# Patient Record
Sex: Male | Born: 1945 | Race: White | Hispanic: No | Marital: Married | Smoking: Former smoker
Health system: Southern US, Community
[De-identification: ages and names within clinical notes are randomized; demographics above are authoritative.]

## PROBLEM LIST (undated history)

## (undated) DIAGNOSIS — I700 Atherosclerosis of aorta: Secondary | ICD-10-CM

## (undated) DIAGNOSIS — G4700 Insomnia, unspecified: Secondary | ICD-10-CM

## (undated) DIAGNOSIS — I480 Paroxysmal atrial fibrillation: Secondary | ICD-10-CM

## (undated) DIAGNOSIS — I2510 Atherosclerotic heart disease of native coronary artery without angina pectoris: Secondary | ICD-10-CM

## (undated) DIAGNOSIS — D649 Anemia, unspecified: Secondary | ICD-10-CM

## (undated) DIAGNOSIS — I10 Essential (primary) hypertension: Secondary | ICD-10-CM

## (undated) DIAGNOSIS — I509 Heart failure, unspecified: Secondary | ICD-10-CM

## (undated) DIAGNOSIS — G629 Polyneuropathy, unspecified: Secondary | ICD-10-CM

## (undated) DIAGNOSIS — G8929 Other chronic pain: Secondary | ICD-10-CM

## (undated) DIAGNOSIS — I499 Cardiac arrhythmia, unspecified: Secondary | ICD-10-CM

## (undated) DIAGNOSIS — I639 Cerebral infarction, unspecified: Secondary | ICD-10-CM

## (undated) DIAGNOSIS — Z96649 Presence of unspecified artificial hip joint: Secondary | ICD-10-CM

## (undated) DIAGNOSIS — M1990 Unspecified osteoarthritis, unspecified site: Secondary | ICD-10-CM

## (undated) DIAGNOSIS — K219 Gastro-esophageal reflux disease without esophagitis: Secondary | ICD-10-CM

## (undated) DIAGNOSIS — E785 Hyperlipidemia, unspecified: Secondary | ICD-10-CM

## (undated) DIAGNOSIS — E119 Type 2 diabetes mellitus without complications: Secondary | ICD-10-CM

## (undated) DIAGNOSIS — M5030 Other cervical disc degeneration, unspecified cervical region: Secondary | ICD-10-CM

## (undated) HISTORY — PX: COLONOSCOPY: SHX174

## (undated) HISTORY — PX: OTHER SURGICAL HISTORY: SHX169

## (undated) HISTORY — PX: VSD REPAIR: SHX276

## (undated) HISTORY — PX: BACK SURGERY: SHX140

## (undated) HISTORY — PX: CARDIAC SURGERY: SHX584

## (undated) HISTORY — PX: CATARACT EXTRACTION W/ INTRAOCULAR LENS IMPLANT: SHX1309

## (undated) HISTORY — PX: CARDIAC VALVE REPLACEMENT: SHX585

## (undated) HISTORY — PX: TONSILLECTOMY: SUR1361

## (undated) HISTORY — PX: JOINT REPLACEMENT: SHX530

## (undated) HISTORY — PX: COLONOSCOPY WITH ESOPHAGOGASTRODUODENOSCOPY (EGD): SHX5779

---

## 2000-12-03 ENCOUNTER — Encounter: Payer: Self-pay | Admitting: Neurosurgery

## 2000-12-03 ENCOUNTER — Ambulatory Visit (HOSPITAL_COMMUNITY): Admission: RE | Admit: 2000-12-03 | Discharge: 2000-12-03 | Payer: Self-pay | Admitting: Neurosurgery

## 2000-12-17 ENCOUNTER — Ambulatory Visit (HOSPITAL_COMMUNITY): Admission: RE | Admit: 2000-12-17 | Discharge: 2000-12-18 | Payer: Self-pay | Admitting: Neurosurgery

## 2000-12-17 ENCOUNTER — Encounter: Payer: Self-pay | Admitting: Neurosurgery

## 2003-11-07 ENCOUNTER — Other Ambulatory Visit: Payer: Self-pay

## 2004-08-11 ENCOUNTER — Ambulatory Visit: Payer: Self-pay | Admitting: Unknown Physician Specialty

## 2005-11-11 ENCOUNTER — Ambulatory Visit: Payer: Self-pay | Admitting: Ophthalmology

## 2006-06-24 ENCOUNTER — Inpatient Hospital Stay (HOSPITAL_COMMUNITY): Admission: RE | Admit: 2006-06-24 | Discharge: 2006-06-25 | Payer: Self-pay | Admitting: Orthopaedic Surgery

## 2008-03-07 ENCOUNTER — Other Ambulatory Visit: Payer: Self-pay

## 2008-03-07 ENCOUNTER — Observation Stay: Payer: Self-pay | Admitting: Internal Medicine

## 2008-11-05 ENCOUNTER — Ambulatory Visit: Payer: Self-pay | Admitting: Specialist

## 2008-11-21 ENCOUNTER — Inpatient Hospital Stay: Payer: Self-pay | Admitting: Specialist

## 2009-06-17 ENCOUNTER — Ambulatory Visit: Payer: Self-pay | Admitting: Unknown Physician Specialty

## 2012-10-31 ENCOUNTER — Ambulatory Visit: Payer: Self-pay | Admitting: Internal Medicine

## 2012-12-09 DIAGNOSIS — M1990 Unspecified osteoarthritis, unspecified site: Secondary | ICD-10-CM | POA: Insufficient documentation

## 2013-02-22 ENCOUNTER — Encounter: Payer: Self-pay | Admitting: Internal Medicine

## 2013-03-19 ENCOUNTER — Encounter: Payer: Self-pay | Admitting: Internal Medicine

## 2013-04-18 ENCOUNTER — Encounter: Payer: Self-pay | Admitting: Internal Medicine

## 2013-07-27 ENCOUNTER — Ambulatory Visit: Payer: Self-pay | Admitting: Specialist

## 2013-07-27 LAB — CBC
HCT: 38.4 % — ABNORMAL LOW (ref 40.0–52.0)
HGB: 13.1 g/dL (ref 13.0–18.0)
MCH: 28.9 pg (ref 26.0–34.0)
MCHC: 34.2 g/dL (ref 32.0–36.0)
MCV: 84 fL (ref 80–100)
Platelet: 213 10*3/uL (ref 150–440)
RBC: 4.55 10*6/uL (ref 4.40–5.90)
RDW: 16 % — ABNORMAL HIGH (ref 11.5–14.5)
WBC: 8.6 10*3/uL (ref 3.8–10.6)

## 2013-07-27 LAB — BASIC METABOLIC PANEL
Anion Gap: 7 (ref 7–16)
BUN: 17 mg/dL (ref 7–18)
Calcium, Total: 9 mg/dL (ref 8.5–10.1)
Chloride: 104 mmol/L (ref 98–107)
Co2: 26 mmol/L (ref 21–32)
Creatinine: 0.89 mg/dL (ref 0.60–1.30)
EGFR (African American): >60
EGFR (Non-African Amer.): >60
Glucose: 190 mg/dL — ABNORMAL HIGH (ref 65–99)
Osmolality: 280 (ref 275–301)
Potassium: 3.7 mmol/L (ref 3.5–5.1)
Sodium: 137 mmol/L (ref 136–145)

## 2013-07-27 LAB — APTT: Activated PTT: 28.1 secs (ref 23.6–35.9)

## 2013-07-27 LAB — URINALYSIS, COMPLETE
Bacteria: NONE SEEN
Blood: NEGATIVE
Leukocyte Esterase: NEGATIVE
Nitrite: NEGATIVE
Protein: NEGATIVE
RBC,UR: 1 /HPF (ref 0–5)

## 2013-07-27 LAB — MRSA PCR SCREENING

## 2013-07-27 LAB — PROTIME-INR: Prothrombin Time: 13.4 secs (ref 11.5–14.7)

## 2013-08-09 ENCOUNTER — Inpatient Hospital Stay: Payer: Self-pay | Admitting: Specialist

## 2013-08-10 LAB — CBC WITH DIFFERENTIAL/PLATELET
Basophil #: 0.1 10*3/uL (ref 0.0–0.1)
Basophil %: 0.8 %
Eosinophil #: 0.1 10*3/uL (ref 0.0–0.7)
HCT: 32.3 % — ABNORMAL LOW (ref 40.0–52.0)
HGB: 11.1 g/dL — ABNORMAL LOW (ref 13.0–18.0)
MCHC: 34.4 g/dL (ref 32.0–36.0)
MCV: 85 fL (ref 80–100)
Monocyte #: 1.1 x10 3/mm — ABNORMAL HIGH (ref 0.2–1.0)
Neutrophil #: 7.4 10*3/uL — ABNORMAL HIGH (ref 1.4–6.5)
RBC: 3.8 10*6/uL — ABNORMAL LOW (ref 4.40–5.90)
RDW: 15.9 % — ABNORMAL HIGH (ref 11.5–14.5)

## 2013-08-10 LAB — BASIC METABOLIC PANEL
BUN: 12 mg/dL (ref 7–18)
Co2: 24 mmol/L (ref 21–32)
Creatinine: 0.98 mg/dL (ref 0.60–1.30)
EGFR (Non-African Amer.): >60
Glucose: 143 mg/dL — ABNORMAL HIGH (ref 65–99)
Osmolality: 272 (ref 275–301)
Sodium: 135 mmol/L — ABNORMAL LOW (ref 136–145)

## 2013-08-11 LAB — HEMOGLOBIN: HGB: 11.8 g/dL — ABNORMAL LOW (ref 13.0–18.0)

## 2014-03-28 DIAGNOSIS — F5104 Psychophysiologic insomnia: Secondary | ICD-10-CM | POA: Insufficient documentation

## 2014-03-28 DIAGNOSIS — E119 Type 2 diabetes mellitus without complications: Secondary | ICD-10-CM | POA: Insufficient documentation

## 2014-06-15 ENCOUNTER — Ambulatory Visit: Payer: Self-pay | Admitting: Unknown Physician Specialty

## 2014-08-23 ENCOUNTER — Ambulatory Visit: Payer: Self-pay | Admitting: Internal Medicine

## 2014-12-28 ENCOUNTER — Ambulatory Visit
Admit: 2014-12-28 | Disposition: A | Discharge status: Home / Self Care | Payer: Self-pay | Attending: Internal Medicine | Admitting: Internal Medicine

## 2015-01-18 ENCOUNTER — Ambulatory Visit
Admit: 2015-01-18 | Disposition: A | Discharge status: Home / Self Care | Payer: Self-pay | Attending: Internal Medicine | Admitting: Internal Medicine

## 2015-01-30 DIAGNOSIS — I10 Essential (primary) hypertension: Secondary | ICD-10-CM | POA: Insufficient documentation

## 2015-01-30 DIAGNOSIS — E782 Mixed hyperlipidemia: Secondary | ICD-10-CM | POA: Insufficient documentation

## 2015-02-08 NOTE — Op Note (Signed)
PATIENT NAME:  [name], ACHORD MR#:  [id number] DATE OF BIRTH:  Apr 29, 1946  DATE OF PROCEDURE:  08/09/2013  PREOPERATIVE DIAGNOSIS: Severe osteoarthritis, right hip.   POSTOPERATIVE DIAGNOSIS: Severe osteoarthritis, right hip.  PROCEDURE PERFORMED: DePuy cementless AML right total hip replacement (16.5 wide femoral stem, 58 mm series 300 acetabular cup, 58 mm polyethylene liner +4/0 degree lip, 36 mm metal head and a +1.5 mm neck length).   SURGEON: Park Breed, M.D.   ASSISTANT: Dr. Mack Guise.   ESTIMATED BLOOD LOSS: 300 mL.  REPLACEMENT: None.   DESCRIPTION OF PROCEDURE: The patient was brought to the operating room where he underwent satisfactory spinal anesthesia. He also underwent general endotracheal anesthesia. He was turned in the left lateral decubitus position and padded appropriately with the use of hip grips. The right hip was prepped and draped in sterile fashion and a posterolateral incision made. Dissection was carried out sharply through subcutaneous tissue with great care being used with electrocautery to control bleeding. The fascia was divided and the Charnley retractor inserted. The short external rotators were exposed and released and tagged. The posterior capsule was exposed. A large Steinmann pin was placed into the pelvis above the hip and bent over with the  legs reduced parallel to each other. A stitch was placed in the vastus fascia to identify the original leg length. This pin was then twisted and turned out of the way and the capsule was opened in a T fashion and tagged. The hip was dislocated and the femoral neck was cut with an oscillating saw. The retractors were inserted to expose the acetabulum. He had a large robust labrum, which was excised. The femoral head measured about 55 and 56 mm in size. Reaming of the acetabulum was started at 53 mm and was carried up to 57 mm. A 58 mm trial cup was inserted and it was extremely snug in all directions and covered  well. This was removed and after thorough irrigation, a series 300, 3 spike, 58 mm metallic cup was inserted in 45 degrees of abduction and 25 degrees of anteversion. This seated very well and very snugly. The trial liner with a +4 depth and 10 mm posterosuperior lip was inserted. The femoral canal was then broached. It was opened with a canal finder and reamed to 16 mm. It was then sequentially broached with rasps up to 16.5 wide. A trial reduction was carried out with a +1.5 and +5 mm neck lengths. The leg length was too tight with +5 and the leg lengths with +1.5 showed just slightly lengthening as we wished. Leg lengths appeared to be excellent. The trials were removed.  The polyethylene liner with the 10 mm lip was inserted with the build-up posterosuperiorly and this was done after a hole eliminator was placed in the cuff. The 16.5 mm femoral wide stem was then inserted fully and another trial reduction carried out. This was successful and a 36 mm head with a +1.5 mm and neck length was inserted. The hip was reduced for a final time. The leg lengths were excellent and stability was excellent. The wound was thoroughly irrigated. The posterior capsule was closed with #2 Tycron. The short external rotators were repaired with a similar suture. The fascia was closed with #2 quill over an Autovac drain and the subcutaneous tissue was closed with 0 quill over another Autovac drain. Pulse irrigation was carried out throughout the procedure and at every level of closure. The skin was closed with staples and  TENS pads applied. Dry sterile dressing was applied and the Autovac was activated. The patient was carefully transferred onto his back and then to his hospital bed. The hip was stable. Leg lengths were excellent. The patient was awakened and taken to recovery in good condition.   ____________________________ Park Breed, MD hem:aw D: 08/09/2013 10:58:26 ET T: 08/09/2013 11:21:19  ET JOB#: [id number]  cc: Park Breed, MD, <Dictator> Park Breed MD ELECTRONICALLY SIGNED 08/09/2013 13:17

## 2015-02-08 NOTE — Discharge Summary (Signed)
PATIENT NAME:  [name], SKOW MR#:  [id number] DATE OF BIRTH:  1945/11/04  DATE OF ADMISSION:  08/09/2013 DATE OF DISCHARGE:  08/12/2013   FINAL DIAGNOSIS: Advanced osteoarthritis, right hip.  OPERATION PERFORMED: On 08/09/2013, right AML cementless total hip replacement.   COMPLICATIONS: None.   CONSULTATIONS: None.   DISCHARGE MEDICATIONS: Norco 5/'325mg'$  q6h prn, EC ASA '325mg'$  BID, home medications as prior to admission.   HISTORY OF PRESENT ILLNESS: The patient is a 69 year old male with severe right hip arthritis. His underlying pain interferes with all activities. He has had to use a walker and has fallen recently. NSAIDS have not helped. He has been using narcotics for pain. He is recovered from heart surgery early this year, which was valve replacement at [facility]. He has been cleared hip surgery by his family doctor and cardiologist. The risks and benefits and postoperative protocol were discussed with him and his wife. X-rays show complete joint destruction, cyst formation and sclerosis. He also tested positive for MRSA with a nasal swab and will be treated with vancomycin perioperatively.   PAST MEDICAL HISTORY: Positive MRSA screen, diabetes, neuropathy, heart murmur, hypercholesterolemia, hypertension, mitral valve prolapse with replacement, cataracts.   OPERATIONS: Mitral valve replacement, cataract surgery, carpal tunnel release, neck surgery left total knee replacement.   ALLERGIES: TYLENOL WITH CODEINE, TETRACYCLINE AND DAIRY PRODUCTS.   HOME MEDICATIONS:  Vitamin B12, methadone 5 mg 1/2 tab 3 times a day, Flexeril 10 mg 3 times a day as needed,  loratadine 10 mg daily, omeprazole 20 mg daily. Metoprolol 25 mg 1/2 tablet twice a day, Lasix 40 mg twice a day, Neurontin 600 mg 3 times a day, carbazepine twice a day. Pravastatin 80 mg daily, potassium chloride 20 mg every other day, lisinopril 10 mg daily,.  multivitamins, 81 mg aspirin daily. Tylenol as needed.   FAMILY  PHYSICIAN: Dr. Frazier Richards.   REVIEW OF SYSTEMS: Otherwise unremarkable.   FAMILY HISTORY: Unremarkable.   SOCIAL HISTORY: The patient lives with his wife and is retired.   PHYSICAL EXAMINATION: Healthy male in no distress. He is overweight. He has severe pain with movement of the right hip. He flexes 80 degrees, extends to -20 degrees. Internal rotation lacks 20 degrees of neutral. He externally rotates to 25 degrees. He is 1 cm shorter on the right leg. Neurovascular status was good distally.   LABORATORY DATA: Laboratory data on admission was satisfactory.   HOSPITAL COURSE: On 08/09/2013 the patient underwent cementless AML right total hip replacement. Postoperatively, he did well with very minimal pain. His X-rays looked excellent. He was ambulated and made good progress with PT.  He was placed on oral antibiotics for two days after the IV antibiotics because of his positive MRSA screen. He was discharged 08/12/2013 to home with home physical therapy. He will be partial weight bear. He will be seen in my office in two weeks for exam.   ____________________________ Park Breed, MD hem:sg D: 08/24/2013 10:19:00 ET T: 08/24/2013 10:27:40 ET JOB#: [id number]  cc: Ocie Cornfield. Ouida Sills, MD Park Breed, MD, <Dictator>    Park Breed MD ELECTRONICALLY SIGNED 08/24/2013 11:34

## 2015-02-08 NOTE — H&P (Signed)
Subjective/Chief Complaint Right hip pain   History of Present Illness 69 year old male has severe right hip osteoarthritis with unrelenting pain that interferes with all daily activiy and sleep.  Has to use a walker at all times and NSAIDs not helping.  He has fallen recently due to hip giving way. Using narcotics for pain.  He had recovered from heart surgery earlier this year for valve replacement and has been cleared for hip surgery.  Risks and benefits of surgery were discussed at length including but not limited to infection, non union, nerve or blood vessed damage, non union, need for repeat surgery, blood clots and lung emboli, and death.  He wishes to proceed.  MRSA screen positive so will cover him with Vancomycin. X-rays show complete joint destruction, cysts, and sclerosis.   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  Neuropathy in feet:   murmur:   Hypercholesterolemia:   HTN:   Mitral Valve Prolapse:   Cataract-bilateral lens replacement:   Carpal Tunnel Release:   Neck Surgery:   Knee Surgery - Left:   ALLERGIES:  Tylenol w/Codeine: Headaches  Tetracycline: Hives  All Dairy Products: Hives  HOME MEDICATIONS: Medication Instructions Status  Vitamin B12 1000 mcg oral tablet 1 tab(s) PO once a day  Active  methadone 5 mg oral tablet 0.5 tab(s) orally 3 times a day Active  cyclobenzaprine 10 mg oral tablet 1 tab(s) orally 3 times a day, As Needed- for Pain  Active  loratadine 10 mg oral tablet 1 tab(s) orally once a day Active  omeprazole 20 mg oral delayed release capsule 1 cap(s) orally once a day Active  Metoprolol Tartrate 25 mg oral tablet 1/2 tab orally 2 times a day Active  Lasix 40 mg oral tablet 1 tab(s) orally 2 times a day Active  gabapentin 600 mg oral tablet 1 tab(s) orally 3 times a day Active  OXcarbazepine 1 tab(s) orally 2  times a day Active  pravastatin 80 mg oral tablet 1 tab(s) orally once a day (at bedtime) Active  potassium chloride 20 mEq oral powder for reconstitution 1 tab(s) orally every other day Active  lisinopril 10 mg oral tablet 1 tab(s) orally once a day Active  multi vitamin 1 tab(s) orally once a day Active  Aspir 81 81 mg oral tablet 1 tab(s) orally once a day Active  Tylenol 325 mg oral tablet 1 tab(s) orally 3 times a day Active   Family and Social History:  Family History Non-Contributory   Social History negative tobacco, negative ETOH   Place of Living Home   Review of Systems:  Fever/Chills No   Cough No   Sputum No   Abdominal Pain No   Physical Exam:  GEN well developed, well nourished, no acute distress   HEENT pink conjunctivae   NECK supple   RESP normal resp effort   CARD regular rate   ABD denies tenderness   LYMPH negative neck   EXTR negative edema, Right hip with severe pain on range of motion.  Fleses to 80*, extends to -20* , internal rotation lacks 20* of neutral, ext rotates 25*.  Right 1 cm short.   SKIN normal to palpation   NEURO motor/sensory function intact   PSYCH alert, A+O to time, place, person, good insight    Assessment/Admission Diagnosis Severe osteoarthritis right hip   Plan Right hip total hip replacement  Electronic Signatures: Park Breed (MD)  (Signed 21-Oct-14 18:17)  Authored: CHIEF COMPLAINT and HISTORY, PAST MEDICAL/SURGIAL HISTORY, ALLERGIES, HOME MEDICATIONS, FAMILY AND SOCIAL HISTORY, REVIEW OF SYSTEMS, PHYSICAL EXAM, ASSESSMENT AND PLAN   Last Updated: 21-Oct-14 18:17 by Park Breed (MD)

## 2015-02-12 DIAGNOSIS — I350 Nonrheumatic aortic (valve) stenosis: Secondary | ICD-10-CM | POA: Insufficient documentation

## 2015-02-12 DIAGNOSIS — I2510 Atherosclerotic heart disease of native coronary artery without angina pectoris: Secondary | ICD-10-CM | POA: Insufficient documentation

## 2015-06-13 ENCOUNTER — Other Ambulatory Visit: Payer: Self-pay | Admitting: Internal Medicine

## 2015-06-13 DIAGNOSIS — R42 Dizziness and giddiness: Secondary | ICD-10-CM

## 2015-06-13 DIAGNOSIS — R270 Ataxia, unspecified: Secondary | ICD-10-CM

## 2015-06-18 ENCOUNTER — Ambulatory Visit: Admission: RE | Admit: 2015-06-18 | Payer: Self-pay | Source: Ambulatory Visit

## 2015-06-18 ENCOUNTER — Ambulatory Visit
Admission: RE | Admit: 2015-06-18 | Discharge: 2015-06-18 | Disposition: A | Discharge status: Home / Self Care | Payer: PPO | Source: Ambulatory Visit | Attending: Internal Medicine | Admitting: Internal Medicine

## 2015-06-18 DIAGNOSIS — R270 Ataxia, unspecified: Secondary | ICD-10-CM | POA: Insufficient documentation

## 2015-06-18 DIAGNOSIS — R42 Dizziness and giddiness: Secondary | ICD-10-CM | POA: Diagnosis present

## 2015-11-06 DIAGNOSIS — M47896 Other spondylosis, lumbar region: Secondary | ICD-10-CM | POA: Diagnosis not present

## 2015-11-06 DIAGNOSIS — G894 Chronic pain syndrome: Secondary | ICD-10-CM | POA: Diagnosis not present

## 2015-12-12 DIAGNOSIS — G894 Chronic pain syndrome: Secondary | ICD-10-CM | POA: Diagnosis not present

## 2015-12-12 DIAGNOSIS — Z6839 Body mass index (BMI) 39.0-39.9, adult: Secondary | ICD-10-CM | POA: Diagnosis not present

## 2015-12-12 DIAGNOSIS — Z79899 Other long term (current) drug therapy: Secondary | ICD-10-CM | POA: Diagnosis not present

## 2015-12-12 DIAGNOSIS — I10 Essential (primary) hypertension: Secondary | ICD-10-CM | POA: Diagnosis not present

## 2015-12-12 DIAGNOSIS — Z79891 Long term (current) use of opiate analgesic: Secondary | ICD-10-CM | POA: Diagnosis not present

## 2016-01-09 DIAGNOSIS — I10 Essential (primary) hypertension: Secondary | ICD-10-CM | POA: Diagnosis not present

## 2016-01-09 DIAGNOSIS — G894 Chronic pain syndrome: Secondary | ICD-10-CM | POA: Diagnosis not present

## 2016-01-09 DIAGNOSIS — M5136 Other intervertebral disc degeneration, lumbar region: Secondary | ICD-10-CM | POA: Diagnosis not present

## 2016-01-09 DIAGNOSIS — Z6839 Body mass index (BMI) 39.0-39.9, adult: Secondary | ICD-10-CM | POA: Diagnosis not present

## 2016-02-10 DIAGNOSIS — I10 Essential (primary) hypertension: Secondary | ICD-10-CM | POA: Diagnosis not present

## 2016-02-10 DIAGNOSIS — Z952 Presence of prosthetic heart valve: Secondary | ICD-10-CM | POA: Diagnosis not present

## 2016-02-10 DIAGNOSIS — E1140 Type 2 diabetes mellitus with diabetic neuropathy, unspecified: Secondary | ICD-10-CM | POA: Diagnosis not present

## 2016-02-10 DIAGNOSIS — I2510 Atherosclerotic heart disease of native coronary artery without angina pectoris: Secondary | ICD-10-CM | POA: Diagnosis not present

## 2016-02-10 DIAGNOSIS — R0602 Shortness of breath: Secondary | ICD-10-CM | POA: Diagnosis not present

## 2016-02-10 DIAGNOSIS — I272 Other secondary pulmonary hypertension: Secondary | ICD-10-CM | POA: Diagnosis not present

## 2016-02-10 DIAGNOSIS — I700 Atherosclerosis of aorta: Secondary | ICD-10-CM | POA: Diagnosis not present

## 2016-02-10 DIAGNOSIS — R42 Dizziness and giddiness: Secondary | ICD-10-CM | POA: Diagnosis not present

## 2016-02-10 DIAGNOSIS — K219 Gastro-esophageal reflux disease without esophagitis: Secondary | ICD-10-CM | POA: Diagnosis not present

## 2016-02-10 DIAGNOSIS — I350 Nonrheumatic aortic (valve) stenosis: Secondary | ICD-10-CM | POA: Diagnosis not present

## 2016-02-10 DIAGNOSIS — E782 Mixed hyperlipidemia: Secondary | ICD-10-CM | POA: Diagnosis not present

## 2016-02-10 DIAGNOSIS — R2681 Unsteadiness on feet: Secondary | ICD-10-CM | POA: Diagnosis not present

## 2016-02-11 DIAGNOSIS — M5136 Other intervertebral disc degeneration, lumbar region: Secondary | ICD-10-CM | POA: Diagnosis not present

## 2016-02-11 DIAGNOSIS — G894 Chronic pain syndrome: Secondary | ICD-10-CM | POA: Diagnosis not present

## 2016-03-10 DIAGNOSIS — G894 Chronic pain syndrome: Secondary | ICD-10-CM | POA: Diagnosis not present

## 2016-03-10 DIAGNOSIS — M5136 Other intervertebral disc degeneration, lumbar region: Secondary | ICD-10-CM | POA: Diagnosis not present

## 2016-03-12 DIAGNOSIS — Z125 Encounter for screening for malignant neoplasm of prostate: Secondary | ICD-10-CM | POA: Diagnosis not present

## 2016-03-12 DIAGNOSIS — E1140 Type 2 diabetes mellitus with diabetic neuropathy, unspecified: Secondary | ICD-10-CM | POA: Diagnosis not present

## 2016-03-12 DIAGNOSIS — I10 Essential (primary) hypertension: Secondary | ICD-10-CM | POA: Diagnosis not present

## 2016-03-12 DIAGNOSIS — E782 Mixed hyperlipidemia: Secondary | ICD-10-CM | POA: Diagnosis not present

## 2016-03-12 DIAGNOSIS — Z79899 Other long term (current) drug therapy: Secondary | ICD-10-CM | POA: Diagnosis not present

## 2016-03-19 DIAGNOSIS — G6289 Other specified polyneuropathies: Secondary | ICD-10-CM | POA: Diagnosis not present

## 2016-03-19 DIAGNOSIS — G894 Chronic pain syndrome: Secondary | ICD-10-CM | POA: Diagnosis not present

## 2016-03-19 DIAGNOSIS — F5104 Psychophysiologic insomnia: Secondary | ICD-10-CM | POA: Diagnosis not present

## 2016-03-19 DIAGNOSIS — I739 Peripheral vascular disease, unspecified: Secondary | ICD-10-CM | POA: Diagnosis not present

## 2016-03-19 DIAGNOSIS — I10 Essential (primary) hypertension: Secondary | ICD-10-CM | POA: Diagnosis not present

## 2016-03-19 DIAGNOSIS — Z0000 Encounter for general adult medical examination without abnormal findings: Secondary | ICD-10-CM | POA: Diagnosis not present

## 2016-03-19 DIAGNOSIS — R42 Dizziness and giddiness: Secondary | ICD-10-CM | POA: Diagnosis not present

## 2016-03-19 DIAGNOSIS — I350 Nonrheumatic aortic (valve) stenosis: Secondary | ICD-10-CM | POA: Diagnosis not present

## 2016-03-19 DIAGNOSIS — E782 Mixed hyperlipidemia: Secondary | ICD-10-CM | POA: Diagnosis not present

## 2016-03-19 DIAGNOSIS — E1140 Type 2 diabetes mellitus with diabetic neuropathy, unspecified: Secondary | ICD-10-CM | POA: Diagnosis not present

## 2016-03-26 DIAGNOSIS — M4806 Spinal stenosis, lumbar region: Secondary | ICD-10-CM | POA: Diagnosis not present

## 2016-03-26 DIAGNOSIS — M5126 Other intervertebral disc displacement, lumbar region: Secondary | ICD-10-CM | POA: Diagnosis not present

## 2016-03-26 DIAGNOSIS — Z981 Arthrodesis status: Secondary | ICD-10-CM | POA: Diagnosis not present

## 2016-03-26 DIAGNOSIS — M4807 Spinal stenosis, lumbosacral region: Secondary | ICD-10-CM | POA: Diagnosis not present

## 2016-04-09 DIAGNOSIS — M5136 Other intervertebral disc degeneration, lumbar region: Secondary | ICD-10-CM | POA: Diagnosis not present

## 2016-04-09 DIAGNOSIS — M545 Low back pain: Secondary | ICD-10-CM | POA: Diagnosis not present

## 2016-04-09 DIAGNOSIS — G894 Chronic pain syndrome: Secondary | ICD-10-CM | POA: Diagnosis not present

## 2016-04-09 DIAGNOSIS — G609 Hereditary and idiopathic neuropathy, unspecified: Secondary | ICD-10-CM | POA: Diagnosis not present

## 2016-04-23 DIAGNOSIS — I83222 Varicose veins of left lower extremity with both ulcer of calf and inflammation: Secondary | ICD-10-CM | POA: Diagnosis not present

## 2016-04-23 DIAGNOSIS — I10 Essential (primary) hypertension: Secondary | ICD-10-CM | POA: Diagnosis not present

## 2016-04-23 DIAGNOSIS — I872 Venous insufficiency (chronic) (peripheral): Secondary | ICD-10-CM | POA: Diagnosis not present

## 2016-04-23 DIAGNOSIS — E119 Type 2 diabetes mellitus without complications: Secondary | ICD-10-CM | POA: Diagnosis not present

## 2016-04-23 DIAGNOSIS — I890 Lymphedema, not elsewhere classified: Secondary | ICD-10-CM | POA: Diagnosis not present

## 2016-04-28 DIAGNOSIS — M5136 Other intervertebral disc degeneration, lumbar region: Secondary | ICD-10-CM | POA: Diagnosis not present

## 2016-05-05 DIAGNOSIS — M5136 Other intervertebral disc degeneration, lumbar region: Secondary | ICD-10-CM | POA: Diagnosis not present

## 2016-05-05 DIAGNOSIS — G894 Chronic pain syndrome: Secondary | ICD-10-CM | POA: Diagnosis not present

## 2016-05-21 DIAGNOSIS — E119 Type 2 diabetes mellitus without complications: Secondary | ICD-10-CM | POA: Diagnosis not present

## 2016-05-21 DIAGNOSIS — I872 Venous insufficiency (chronic) (peripheral): Secondary | ICD-10-CM | POA: Diagnosis not present

## 2016-05-21 DIAGNOSIS — I890 Lymphedema, not elsewhere classified: Secondary | ICD-10-CM | POA: Diagnosis not present

## 2016-05-21 DIAGNOSIS — M79609 Pain in unspecified limb: Secondary | ICD-10-CM | POA: Diagnosis not present

## 2016-05-21 DIAGNOSIS — I10 Essential (primary) hypertension: Secondary | ICD-10-CM | POA: Diagnosis not present

## 2016-05-21 DIAGNOSIS — M7989 Other specified soft tissue disorders: Secondary | ICD-10-CM | POA: Diagnosis not present

## 2016-05-21 DIAGNOSIS — I83222 Varicose veins of left lower extremity with both ulcer of calf and inflammation: Secondary | ICD-10-CM | POA: Diagnosis not present

## 2016-06-03 DIAGNOSIS — Z79899 Other long term (current) drug therapy: Secondary | ICD-10-CM | POA: Diagnosis not present

## 2016-06-03 DIAGNOSIS — M5136 Other intervertebral disc degeneration, lumbar region: Secondary | ICD-10-CM | POA: Diagnosis not present

## 2016-06-03 DIAGNOSIS — G894 Chronic pain syndrome: Secondary | ICD-10-CM | POA: Diagnosis not present

## 2016-06-26 DIAGNOSIS — E1140 Type 2 diabetes mellitus with diabetic neuropathy, unspecified: Secondary | ICD-10-CM | POA: Diagnosis not present

## 2016-06-26 DIAGNOSIS — I10 Essential (primary) hypertension: Secondary | ICD-10-CM | POA: Diagnosis not present

## 2016-06-26 DIAGNOSIS — G894 Chronic pain syndrome: Secondary | ICD-10-CM | POA: Diagnosis not present

## 2016-06-26 DIAGNOSIS — Z79899 Other long term (current) drug therapy: Secondary | ICD-10-CM | POA: Diagnosis not present

## 2016-06-26 DIAGNOSIS — G6289 Other specified polyneuropathies: Secondary | ICD-10-CM | POA: Diagnosis not present

## 2016-07-10 DIAGNOSIS — G894 Chronic pain syndrome: Secondary | ICD-10-CM | POA: Diagnosis not present

## 2016-07-10 DIAGNOSIS — M5136 Other intervertebral disc degeneration, lumbar region: Secondary | ICD-10-CM | POA: Diagnosis not present

## 2016-07-20 DIAGNOSIS — M5136 Other intervertebral disc degeneration, lumbar region: Secondary | ICD-10-CM | POA: Diagnosis not present

## 2016-08-12 DIAGNOSIS — M79672 Pain in left foot: Secondary | ICD-10-CM | POA: Diagnosis not present

## 2016-08-12 DIAGNOSIS — M79671 Pain in right foot: Secondary | ICD-10-CM | POA: Diagnosis not present

## 2016-08-12 DIAGNOSIS — R42 Dizziness and giddiness: Secondary | ICD-10-CM | POA: Diagnosis not present

## 2016-08-12 DIAGNOSIS — E782 Mixed hyperlipidemia: Secondary | ICD-10-CM | POA: Diagnosis not present

## 2016-08-12 DIAGNOSIS — I700 Atherosclerosis of aorta: Secondary | ICD-10-CM | POA: Diagnosis not present

## 2016-08-12 DIAGNOSIS — I272 Pulmonary hypertension, unspecified: Secondary | ICD-10-CM | POA: Diagnosis not present

## 2016-08-12 DIAGNOSIS — I2510 Atherosclerotic heart disease of native coronary artery without angina pectoris: Secondary | ICD-10-CM | POA: Diagnosis not present

## 2016-08-12 DIAGNOSIS — E1140 Type 2 diabetes mellitus with diabetic neuropathy, unspecified: Secondary | ICD-10-CM | POA: Diagnosis not present

## 2016-08-12 DIAGNOSIS — I10 Essential (primary) hypertension: Secondary | ICD-10-CM | POA: Diagnosis not present

## 2016-08-25 DIAGNOSIS — M47896 Other spondylosis, lumbar region: Secondary | ICD-10-CM | POA: Diagnosis not present

## 2016-08-25 DIAGNOSIS — G894 Chronic pain syndrome: Secondary | ICD-10-CM | POA: Diagnosis not present

## 2016-08-25 DIAGNOSIS — M545 Low back pain: Secondary | ICD-10-CM | POA: Diagnosis not present

## 2016-10-02 ENCOUNTER — Inpatient Hospital Stay: Payer: PPO

## 2016-10-02 ENCOUNTER — Encounter: Payer: Self-pay | Admitting: Emergency Medicine

## 2016-10-02 ENCOUNTER — Emergency Department: Payer: PPO

## 2016-10-02 ENCOUNTER — Inpatient Hospital Stay
Admission: EM | Admit: 2016-10-02 | Discharge: 2016-10-10 | DRG: 871 | Disposition: A | Discharge status: Skilled Nursing Facility | Payer: PPO | Attending: Internal Medicine | Admitting: Internal Medicine

## 2016-10-02 DIAGNOSIS — K219 Gastro-esophageal reflux disease without esophagitis: Secondary | ICD-10-CM | POA: Diagnosis not present

## 2016-10-02 DIAGNOSIS — R4182 Altered mental status, unspecified: Secondary | ICD-10-CM

## 2016-10-02 DIAGNOSIS — J9601 Acute respiratory failure with hypoxia: Secondary | ICD-10-CM | POA: Diagnosis present

## 2016-10-02 DIAGNOSIS — W08XXXA Fall from other furniture, initial encounter: Secondary | ICD-10-CM | POA: Diagnosis present

## 2016-10-02 DIAGNOSIS — G894 Chronic pain syndrome: Secondary | ICD-10-CM | POA: Diagnosis not present

## 2016-10-02 DIAGNOSIS — E785 Hyperlipidemia, unspecified: Secondary | ICD-10-CM | POA: Diagnosis not present

## 2016-10-02 DIAGNOSIS — R4189 Other symptoms and signs involving cognitive functions and awareness: Secondary | ICD-10-CM | POA: Diagnosis not present

## 2016-10-02 DIAGNOSIS — R0603 Acute respiratory distress: Secondary | ICD-10-CM | POA: Diagnosis not present

## 2016-10-02 DIAGNOSIS — Z825 Family history of asthma and other chronic lower respiratory diseases: Secondary | ICD-10-CM

## 2016-10-02 DIAGNOSIS — I011 Acute rheumatic endocarditis: Secondary | ICD-10-CM | POA: Diagnosis present

## 2016-10-02 DIAGNOSIS — Y92009 Unspecified place in unspecified non-institutional (private) residence as the place of occurrence of the external cause: Secondary | ICD-10-CM

## 2016-10-02 DIAGNOSIS — Z7982 Long term (current) use of aspirin: Secondary | ICD-10-CM | POA: Diagnosis not present

## 2016-10-02 DIAGNOSIS — R509 Fever, unspecified: Secondary | ICD-10-CM | POA: Diagnosis not present

## 2016-10-02 DIAGNOSIS — E1140 Type 2 diabetes mellitus with diabetic neuropathy, unspecified: Secondary | ICD-10-CM | POA: Diagnosis present

## 2016-10-02 DIAGNOSIS — R262 Difficulty in walking, not elsewhere classified: Secondary | ICD-10-CM

## 2016-10-02 DIAGNOSIS — Z79891 Long term (current) use of opiate analgesic: Secondary | ICD-10-CM | POA: Diagnosis not present

## 2016-10-02 DIAGNOSIS — Z8249 Family history of ischemic heart disease and other diseases of the circulatory system: Secondary | ICD-10-CM

## 2016-10-02 DIAGNOSIS — J9600 Acute respiratory failure, unspecified whether with hypoxia or hypercapnia: Secondary | ICD-10-CM | POA: Diagnosis not present

## 2016-10-02 DIAGNOSIS — J189 Pneumonia, unspecified organism: Secondary | ICD-10-CM | POA: Diagnosis not present

## 2016-10-02 DIAGNOSIS — S80811A Abrasion, right lower leg, initial encounter: Secondary | ICD-10-CM | POA: Diagnosis present

## 2016-10-02 DIAGNOSIS — G9340 Encephalopathy, unspecified: Secondary | ICD-10-CM | POA: Diagnosis not present

## 2016-10-02 DIAGNOSIS — E872 Acidosis, unspecified: Secondary | ICD-10-CM

## 2016-10-02 DIAGNOSIS — R338 Other retention of urine: Secondary | ICD-10-CM | POA: Diagnosis not present

## 2016-10-02 DIAGNOSIS — R6520 Severe sepsis without septic shock: Secondary | ICD-10-CM | POA: Diagnosis not present

## 2016-10-02 DIAGNOSIS — A419 Sepsis, unspecified organism: Secondary | ICD-10-CM | POA: Diagnosis present

## 2016-10-02 DIAGNOSIS — G9341 Metabolic encephalopathy: Secondary | ICD-10-CM | POA: Diagnosis not present

## 2016-10-02 DIAGNOSIS — H109 Unspecified conjunctivitis: Secondary | ICD-10-CM | POA: Diagnosis not present

## 2016-10-02 DIAGNOSIS — I10 Essential (primary) hypertension: Secondary | ICD-10-CM | POA: Diagnosis not present

## 2016-10-02 DIAGNOSIS — Z7984 Long term (current) use of oral hypoglycemic drugs: Secondary | ICD-10-CM

## 2016-10-02 DIAGNOSIS — I2510 Atherosclerotic heart disease of native coronary artery without angina pectoris: Secondary | ICD-10-CM | POA: Diagnosis present

## 2016-10-02 DIAGNOSIS — S0990XA Unspecified injury of head, initial encounter: Secondary | ICD-10-CM | POA: Diagnosis not present

## 2016-10-02 DIAGNOSIS — E876 Hypokalemia: Secondary | ICD-10-CM | POA: Diagnosis present

## 2016-10-02 DIAGNOSIS — Z789 Other specified health status: Secondary | ICD-10-CM

## 2016-10-02 DIAGNOSIS — Z4682 Encounter for fitting and adjustment of non-vascular catheter: Secondary | ICD-10-CM | POA: Diagnosis not present

## 2016-10-02 DIAGNOSIS — I509 Heart failure, unspecified: Secondary | ICD-10-CM | POA: Diagnosis not present

## 2016-10-02 DIAGNOSIS — J9811 Atelectasis: Secondary | ICD-10-CM | POA: Diagnosis not present

## 2016-10-02 DIAGNOSIS — Z952 Presence of prosthetic heart valve: Secondary | ICD-10-CM

## 2016-10-02 DIAGNOSIS — Z79899 Other long term (current) drug therapy: Secondary | ICD-10-CM | POA: Diagnosis not present

## 2016-10-02 DIAGNOSIS — W19XXXA Unspecified fall, initial encounter: Secondary | ICD-10-CM

## 2016-10-02 DIAGNOSIS — I4891 Unspecified atrial fibrillation: Secondary | ICD-10-CM | POA: Diagnosis present

## 2016-10-02 DIAGNOSIS — S80812A Abrasion, left lower leg, initial encounter: Secondary | ICD-10-CM | POA: Diagnosis present

## 2016-10-02 DIAGNOSIS — M1990 Unspecified osteoarthritis, unspecified site: Secondary | ICD-10-CM | POA: Diagnosis present

## 2016-10-02 DIAGNOSIS — Z6839 Body mass index (BMI) 39.0-39.9, adult: Secondary | ICD-10-CM

## 2016-10-02 DIAGNOSIS — A4101 Sepsis due to Methicillin susceptible Staphylococcus aureus: Principal | ICD-10-CM | POA: Diagnosis present

## 2016-10-02 DIAGNOSIS — M6281 Muscle weakness (generalized): Secondary | ICD-10-CM

## 2016-10-02 DIAGNOSIS — R0600 Dyspnea, unspecified: Secondary | ICD-10-CM

## 2016-10-02 DIAGNOSIS — A4901 Methicillin susceptible Staphylococcus aureus infection, unspecified site: Secondary | ICD-10-CM | POA: Diagnosis not present

## 2016-10-02 DIAGNOSIS — L8990 Pressure ulcer of unspecified site, unspecified stage: Secondary | ICD-10-CM | POA: Insufficient documentation

## 2016-10-02 HISTORY — DX: Unspecified osteoarthritis, unspecified site: M19.90

## 2016-10-02 HISTORY — DX: Type 2 diabetes mellitus without complications: E11.9

## 2016-10-02 HISTORY — DX: Gastro-esophageal reflux disease without esophagitis: K21.9

## 2016-10-02 HISTORY — DX: Essential (primary) hypertension: I10

## 2016-10-02 HISTORY — DX: Other chronic pain: G89.29

## 2016-10-02 HISTORY — DX: Atherosclerotic heart disease of native coronary artery without angina pectoris: I25.10

## 2016-10-02 HISTORY — DX: Hyperlipidemia, unspecified: E78.5

## 2016-10-02 HISTORY — DX: Polyneuropathy, unspecified: G62.9

## 2016-10-02 HISTORY — DX: Insomnia, unspecified: G47.00

## 2016-10-02 LAB — BLOOD GAS, VENOUS
Acid-base deficit: 0.4 mmol/L (ref 0.0–2.0)
Bicarbonate: 21.8 mmol/L (ref 20.0–28.0)
FIO2: 21
O2 SAT: 67 %
PATIENT TEMPERATURE: 37
PCO2 VEN: 28 mmHg — AB (ref 44.0–60.0)
PO2 VEN: 31 mmHg — AB (ref 32.0–45.0)
pH, Ven: 7.5 — ABNORMAL HIGH (ref 7.250–7.430)

## 2016-10-02 LAB — URINALYSIS, COMPLETE (UACMP) WITH MICROSCOPIC
BILIRUBIN URINE: NEGATIVE
Bacteria, UA: NONE SEEN
Glucose, UA: NEGATIVE mg/dL
HGB URINE DIPSTICK: NEGATIVE
Ketones, ur: 20 mg/dL — AB
LEUKOCYTES UA: NEGATIVE
NITRITE: NEGATIVE
PH: 5 (ref 5.0–8.0)
Protein, ur: NEGATIVE mg/dL
SPECIFIC GRAVITY, URINE: 1.019 (ref 1.005–1.030)
SQUAMOUS EPITHELIAL / LPF: NONE SEEN

## 2016-10-02 LAB — CBC WITH DIFFERENTIAL/PLATELET
BASOS ABS: 0 10*3/uL (ref 0–0.1)
Basophils Absolute: 0 10*3/uL (ref 0–0.1)
Basophils Relative: 0 %
Basophils Relative: 0 %
EOS ABS: 0 10*3/uL (ref 0–0.7)
EOS ABS: 0 10*3/uL (ref 0–0.7)
EOS PCT: 0 %
Eosinophils Relative: 0 %
HCT: 36.8 % — ABNORMAL LOW (ref 40.0–52.0)
HEMATOCRIT: 40.9 % (ref 40.0–52.0)
HEMOGLOBIN: 13.5 g/dL (ref 13.0–18.0)
Hemoglobin: 12.5 g/dL — ABNORMAL LOW (ref 13.0–18.0)
LYMPHS ABS: 0.4 10*3/uL — AB (ref 1.0–3.6)
LYMPHS PCT: 3 %
Lymphocytes Relative: 2 %
Lymphs Abs: 0.3 10*3/uL — ABNORMAL LOW (ref 1.0–3.6)
MCH: 29.1 pg (ref 26.0–34.0)
MCH: 28.9 pg (ref 26.0–34.0)
MCHC: 34.1 g/dL (ref 32.0–36.0)
MCHC: 33.1 g/dL (ref 32.0–36.0)
MCV: 85.4 fL (ref 80.0–100.0)
MCV: 87.2 fL (ref 80.0–100.0)
MONO ABS: 0.6 10*3/uL (ref 0.2–1.0)
Monocytes Absolute: 0.6 10*3/uL (ref 0.2–1.0)
Monocytes Relative: 4 %
Monocytes Relative: 5 %
NEUTROS ABS: 11.1 10*3/uL — AB (ref 1.4–6.5)
NEUTROS PCT: 92 %
Neutro Abs: 13.3 10*3/uL — ABNORMAL HIGH (ref 1.4–6.5)
Neutrophils Relative %: 94 %
PLATELETS: 145 10*3/uL — AB (ref 150–440)
Platelets: 117 10*3/uL — ABNORMAL LOW (ref 150–440)
RBC: 4.31 MIL/uL — AB (ref 4.40–5.90)
RBC: 4.68 MIL/uL (ref 4.40–5.90)
RDW: 15.7 % — AB (ref 11.5–14.5)
RDW: 16 % — ABNORMAL HIGH (ref 11.5–14.5)
WBC: 14.2 10*3/uL — AB (ref 3.8–10.6)
WBC: 12.2 10*3/uL — AB (ref 3.8–10.6)

## 2016-10-02 LAB — BLOOD GAS, ARTERIAL
ACID-BASE DEFICIT: 6.5 mmol/L — AB (ref 0.0–2.0)
ACID-BASE DEFICIT: 3.6 mmol/L — AB (ref 0.0–2.0)
Allens test (pass/fail): POSITIVE — AB
BICARBONATE: 21.4 mmol/L (ref 20.0–28.0)
BICARBONATE: 23.5 mmol/L (ref 20.0–28.0)
FIO2: 100
FIO2: 50
O2 SAT: 96.5 %
O2 Saturation: 86.3 %
PATIENT TEMPERATURE: 37
PATIENT TEMPERATURE: 37
PCO2 ART: 51 mmHg — AB (ref 32.0–48.0)
PCO2 ART: 50 mmHg — AB (ref 32.0–48.0)
PEEP/CPAP: 10 cmH2O
PEEP: 5 cmH2O
PO2 ART: 96 mmHg (ref 83.0–108.0)
RATE: 24 resp/min
VT: 500 mL
VT: 500 mL
pH, Arterial: 7.23 — ABNORMAL LOW (ref 7.350–7.450)
pH, Arterial: 7.28 — ABNORMAL LOW (ref 7.350–7.450)
pO2, Arterial: 62 mmHg — ABNORMAL LOW (ref 83.0–108.0)

## 2016-10-02 LAB — GLUCOSE, CSF: GLUCOSE CSF: 133 mg/dL — AB (ref 40–70)

## 2016-10-02 LAB — COMPREHENSIVE METABOLIC PANEL
ALK PHOS: 76 U/L (ref 38–126)
ALT: 44 U/L (ref 17–63)
ALT: 59 U/L (ref 17–63)
ANION GAP: 11 (ref 5–15)
AST: 97 U/L — AB (ref 15–41)
AST: 122 U/L — ABNORMAL HIGH (ref 15–41)
Albumin: 3.5 g/dL (ref 3.5–5.0)
Albumin: 3.2 g/dL — ABNORMAL LOW (ref 3.5–5.0)
Alkaline Phosphatase: 66 U/L (ref 38–126)
Anion gap: 12 (ref 5–15)
BILIRUBIN TOTAL: 0.7 mg/dL (ref 0.3–1.2)
BUN: 14 mg/dL (ref 6–20)
BUN: 13 mg/dL (ref 6–20)
CALCIUM: 8.4 mg/dL — AB (ref 8.9–10.3)
CHLORIDE: 100 mmol/L — AB (ref 101–111)
CO2: 22 mmol/L (ref 22–32)
CO2: 20 mmol/L — AB (ref 22–32)
CREATININE: 0.85 mg/dL (ref 0.61–1.24)
Calcium: 7.5 mg/dL — ABNORMAL LOW (ref 8.9–10.3)
Chloride: 99 mmol/L — ABNORMAL LOW (ref 101–111)
Creatinine, Ser: 0.78 mg/dL (ref 0.61–1.24)
Glucose, Bld: 195 mg/dL — ABNORMAL HIGH (ref 65–99)
Glucose, Bld: 221 mg/dL — ABNORMAL HIGH (ref 65–99)
POTASSIUM: 3.2 mmol/L — AB (ref 3.5–5.1)
Potassium: 3.1 mmol/L — ABNORMAL LOW (ref 3.5–5.1)
SODIUM: 131 mmol/L — AB (ref 135–145)
Sodium: 133 mmol/L — ABNORMAL LOW (ref 135–145)
TOTAL PROTEIN: 7 g/dL (ref 6.5–8.1)
Total Bilirubin: 0.7 mg/dL (ref 0.3–1.2)
Total Protein: 6.9 g/dL (ref 6.5–8.1)

## 2016-10-02 LAB — CSF CELL COUNT WITH DIFFERENTIAL
Eosinophils, CSF: 0 %
Lymphs, CSF: 40 %
Monocyte-Macrophage-Spinal Fluid: 52 %
Other Cells, CSF: 0
RBC COUNT CSF: 42 /mm3 — AB (ref 0–3)
SEGMENTED NEUTROPHILS-CSF: 8 %
Tube #: 1
WBC CSF: 14 /mm3 — AB (ref 0–5)

## 2016-10-02 LAB — BLOOD CULTURE ID PANEL (REFLEXED)
Acinetobacter baumannii: NOT DETECTED
CANDIDA GLABRATA: NOT DETECTED
CANDIDA KRUSEI: NOT DETECTED
Candida albicans: NOT DETECTED
Candida parapsilosis: NOT DETECTED
Candida tropicalis: NOT DETECTED
ENTEROBACTER CLOACAE COMPLEX: NOT DETECTED
ENTEROCOCCUS SPECIES: NOT DETECTED
ESCHERICHIA COLI: NOT DETECTED
Enterobacteriaceae species: NOT DETECTED
Haemophilus influenzae: NOT DETECTED
KLEBSIELLA PNEUMONIAE: NOT DETECTED
Klebsiella oxytoca: NOT DETECTED
LISTERIA MONOCYTOGENES: NOT DETECTED
Methicillin resistance: NOT DETECTED
Neisseria meningitidis: NOT DETECTED
PROTEUS SPECIES: NOT DETECTED
Pseudomonas aeruginosa: NOT DETECTED
STAPHYLOCOCCUS SPECIES: DETECTED — AB
STREPTOCOCCUS AGALACTIAE: NOT DETECTED
STREPTOCOCCUS PYOGENES: NOT DETECTED
Serratia marcescens: NOT DETECTED
Staphylococcus aureus (BCID): DETECTED — AB
Streptococcus pneumoniae: NOT DETECTED
Streptococcus species: NOT DETECTED

## 2016-10-02 LAB — INFLUENZA PANEL BY PCR (TYPE A & B)
INFLAPCR: NEGATIVE
INFLBPCR: NEGATIVE

## 2016-10-02 LAB — PROTIME-INR
INR: 1.22
PROTHROMBIN TIME: 15.5 s — AB (ref 11.4–15.2)

## 2016-10-02 LAB — EXPECTORATED SPUTUM ASSESSMENT W GRAM STAIN, RFLX TO RESP C

## 2016-10-02 LAB — LACTIC ACID, PLASMA
LACTIC ACID, VENOUS: 2.5 mmol/L — AB (ref 0.5–1.9)
Lactic Acid, Venous: 4.5 mmol/L (ref 0.5–1.9)

## 2016-10-02 LAB — GLUCOSE, CAPILLARY
GLUCOSE-CAPILLARY: 212 mg/dL — AB (ref 65–99)
Glucose-Capillary: 172 mg/dL — ABNORMAL HIGH (ref 65–99)
Glucose-Capillary: 191 mg/dL — ABNORMAL HIGH (ref 65–99)
Glucose-Capillary: 213 mg/dL — ABNORMAL HIGH (ref 65–99)

## 2016-10-02 LAB — PROTEIN, CSF: TOTAL PROTEIN, CSF: 78 mg/dL — AB (ref 15–45)

## 2016-10-02 LAB — TROPONIN I: TROPONIN I: 0.06 ng/mL — AB (ref <0.03)

## 2016-10-02 LAB — EXPECTORATED SPUTUM ASSESSMENT W REFEX TO RESP CULTURE

## 2016-10-02 LAB — PROCALCITONIN: PROCALCITONIN: 4.82 ng/mL

## 2016-10-02 LAB — MAGNESIUM: MAGNESIUM: 1.7 mg/dL (ref 1.7–2.4)

## 2016-10-02 LAB — MRSA PCR SCREENING: MRSA by PCR: NEGATIVE

## 2016-10-02 MED ORDER — HALOPERIDOL LACTATE 5 MG/ML IJ SOLN
2.0000 mg | Freq: Once | INTRAMUSCULAR | Status: AC
  Administered 2016-10-02: 2 mg via INTRAVENOUS
  Filled 2016-10-02: qty 1

## 2016-10-02 MED ORDER — FENTANYL 2500MCG IN NS 250ML (10MCG/ML) PREMIX INFUSION
25.0000 ug/h | INTRAVENOUS | Status: DC
  Administered 2016-10-02: 25 ug/h via INTRAVENOUS
  Administered 2016-10-03: 250 ug/h via INTRAVENOUS
  Administered 2016-10-03: 125 ug/h via INTRAVENOUS
  Administered 2016-10-04: 250 ug/h via INTRAVENOUS
  Filled 2016-10-02 (×4): qty 250

## 2016-10-02 MED ORDER — VECURONIUM BROMIDE 10 MG IV SOLR
10.0000 mg | Freq: Once | INTRAVENOUS | Status: AC
  Administered 2016-10-02: 10 mg via INTRAVENOUS
  Filled 2016-10-02 (×2): qty 10

## 2016-10-02 MED ORDER — ACETAMINOPHEN 650 MG RE SUPP
650.0000 mg | Freq: Once | RECTAL | Status: AC
  Administered 2016-10-02: 650 mg via RECTAL

## 2016-10-02 MED ORDER — FENTANYL CITRATE (PF) 100 MCG/2ML IJ SOLN
100.0000 ug | Freq: Once | INTRAMUSCULAR | Status: AC
  Administered 2016-10-02: 100 ug via INTRAVENOUS
  Filled 2016-10-02: qty 2

## 2016-10-02 MED ORDER — SENNOSIDES-DOCUSATE SODIUM 8.6-50 MG PO TABS
1.0000 | ORAL_TABLET | Freq: Two times a day (BID) | ORAL | Status: DC
  Administered 2016-10-02 – 2016-10-09 (×15): 1 via ORAL
  Filled 2016-10-02 (×16): qty 1

## 2016-10-02 MED ORDER — LORAZEPAM 2 MG/ML IJ SOLN
1.0000 mg | Freq: Once | INTRAMUSCULAR | Status: AC
  Administered 2016-10-02: 1 mg via INTRAVENOUS

## 2016-10-02 MED ORDER — ACETAMINOPHEN 650 MG RE SUPP
650.0000 mg | Freq: Four times a day (QID) | RECTAL | Status: DC | PRN
  Administered 2016-10-03: 650 mg via RECTAL
  Filled 2016-10-02 (×2): qty 1

## 2016-10-02 MED ORDER — LORAZEPAM 2 MG/ML IJ SOLN
INTRAMUSCULAR | Status: AC
  Administered 2016-10-02: 1 mg via INTRAVENOUS
  Filled 2016-10-02: qty 1

## 2016-10-02 MED ORDER — PROPOFOL 1000 MG/100ML IV EMUL
5.0000 ug/kg/min | Freq: Once | INTRAVENOUS | Status: AC
  Administered 2016-10-02: 5 ug/kg/min via INTRAVENOUS

## 2016-10-02 MED ORDER — ONDANSETRON HCL 4 MG/2ML IJ SOLN: 4.0000 mg | Freq: Four times a day (QID) | INTRAMUSCULAR | Status: DC | PRN

## 2016-10-02 MED ORDER — CEFTRIAXONE SODIUM-DEXTROSE 2-2.22 GM-% IV SOLR
2.0000 g | Freq: Two times a day (BID) | INTRAVENOUS | Status: DC
  Administered 2016-10-02: 2 g via INTRAVENOUS
  Filled 2016-10-02 (×3): qty 50

## 2016-10-02 MED ORDER — FENTANYL CITRATE (PF) 100 MCG/2ML IJ SOLN
50.0000 ug | INTRAMUSCULAR | Status: DC | PRN
  Administered 2016-10-03: 50 ug via INTRAVENOUS
  Filled 2016-10-02: qty 2

## 2016-10-02 MED ORDER — VECURONIUM BROMIDE 10 MG IV SOLR
INTRAVENOUS | Status: AC
  Filled 2016-10-02: qty 10

## 2016-10-02 MED ORDER — SODIUM CHLORIDE 0.9 % IV SOLN
Freq: Once | INTRAVENOUS | Status: AC
  Administered 2016-10-02: 09:00:00 via INTRAVENOUS

## 2016-10-02 MED ORDER — SODIUM CHLORIDE 0.9 % IV SOLN
2.0000 g | INTRAVENOUS | Status: DC
  Administered 2016-10-02 (×2): 2 g via INTRAVENOUS
  Filled 2016-10-02 (×6): qty 2000

## 2016-10-02 MED ORDER — ACETAMINOPHEN 650 MG RE SUPP
RECTAL | Status: AC
  Filled 2016-10-02: qty 1

## 2016-10-02 MED ORDER — DEXTROSE 5 % IV SOLN
950.0000 mg | Freq: Three times a day (TID) | INTRAVENOUS | Status: DC
  Administered 2016-10-03 – 2016-10-05 (×8): 950 mg via INTRAVENOUS
  Filled 2016-10-02 (×13): qty 19

## 2016-10-02 MED ORDER — LORAZEPAM 2 MG/ML IJ SOLN
1.0000 mg | Freq: Once | INTRAMUSCULAR | Status: AC
Start: 2016-10-02 — End: 2016-10-02
  Administered 2016-10-02: 1 mg via INTRAVENOUS

## 2016-10-02 MED ORDER — VECURONIUM BROMIDE 10 MG IV SOLR
10.0000 mg | Freq: Once | INTRAVENOUS | Status: AC
  Administered 2016-10-02: 10 mg via INTRAVENOUS
  Filled 2016-10-02: qty 10

## 2016-10-02 MED ORDER — ACYCLOVIR SODIUM 50 MG/ML IV SOLN
10.0000 mg/kg | Freq: Three times a day (TID) | INTRAVENOUS | Status: DC
  Administered 2016-10-02: 755 mg via INTRAVENOUS
  Filled 2016-10-02 (×3): qty 15.1

## 2016-10-02 MED ORDER — SODIUM CHLORIDE 0.9 % IV SOLN
1750.0000 mg | Freq: Two times a day (BID) | INTRAVENOUS | Status: DC
  Administered 2016-10-02: 1750 mg via INTRAVENOUS
  Filled 2016-10-02 (×2): qty 1750

## 2016-10-02 MED ORDER — FENTANYL CITRATE (PF) 100 MCG/2ML IJ SOLN: 50.0000 ug | Freq: Once | INTRAMUSCULAR | Status: DC

## 2016-10-02 MED ORDER — LORAZEPAM 2 MG/ML IJ SOLN
INTRAMUSCULAR | Status: AC
  Administered 2016-10-02: 2 mg via INTRAVENOUS
  Filled 2016-10-02: qty 1

## 2016-10-02 MED ORDER — IPRATROPIUM-ALBUTEROL 0.5-2.5 (3) MG/3ML IN SOLN
3.0000 mL | Freq: Four times a day (QID) | RESPIRATORY_TRACT | Status: DC
  Administered 2016-10-02 – 2016-10-04 (×9): 3 mL via RESPIRATORY_TRACT
  Filled 2016-10-02 (×9): qty 3

## 2016-10-02 MED ORDER — MIDAZOLAM HCL 2 MG/2ML IJ SOLN
2.0000 mg | Freq: Once | INTRAMUSCULAR | Status: AC
  Administered 2016-10-02: 2 mg via INTRAVENOUS

## 2016-10-02 MED ORDER — MIDAZOLAM HCL 2 MG/2ML IJ SOLN
1.0000 mg | INTRAMUSCULAR | Status: DC | PRN
  Administered 2016-10-02: 1 mg via INTRAVENOUS
  Filled 2016-10-02 (×2): qty 2

## 2016-10-02 MED ORDER — VECURONIUM BROMIDE 10 MG IV SOLR
10.0000 mg | Freq: Once | INTRAVENOUS | Status: AC
  Administered 2016-10-02: 10 mg via INTRAVENOUS

## 2016-10-02 MED ORDER — INSULIN ASPART 100 UNIT/ML ~~LOC~~ SOLN
2.0000 [IU] | SUBCUTANEOUS | Status: DC
  Administered 2016-10-02: 4 [IU] via SUBCUTANEOUS
  Administered 2016-10-03: 6 [IU] via SUBCUTANEOUS
  Administered 2016-10-03 (×6): 4 [IU] via SUBCUTANEOUS
  Administered 2016-10-04: 2 [IU] via SUBCUTANEOUS
  Administered 2016-10-04: 4 [IU] via SUBCUTANEOUS
  Administered 2016-10-04 – 2016-10-05 (×4): 2 [IU] via SUBCUTANEOUS
  Administered 2016-10-05: 4 [IU] via SUBCUTANEOUS
  Administered 2016-10-05 – 2016-10-06 (×4): 2 [IU] via SUBCUTANEOUS
  Filled 2016-10-02: qty 2
  Filled 2016-10-02 (×2): qty 4
  Filled 2016-10-02 (×2): qty 6
  Filled 2016-10-02 (×2): qty 2
  Filled 2016-10-02: qty 4
  Filled 2016-10-02: qty 2
  Filled 2016-10-02 (×2): qty 4
  Filled 2016-10-02: qty 6
  Filled 2016-10-02: qty 2
  Filled 2016-10-02 (×2): qty 4
  Filled 2016-10-02: qty 6
  Filled 2016-10-02 (×3): qty 2

## 2016-10-02 MED ORDER — CHLORHEXIDINE GLUCONATE 0.12% ORAL RINSE (MEDLINE KIT)
15.0000 mL | Freq: Two times a day (BID) | OROMUCOSAL | Status: DC
  Administered 2016-10-02 – 2016-10-10 (×13): 15 mL via OROMUCOSAL

## 2016-10-02 MED ORDER — LEVOFLOXACIN IN D5W 750 MG/150ML IV SOLN
750.0000 mg | Freq: Every morning | INTRAVENOUS | Status: DC
  Administered 2016-10-02: 750 mg via INTRAVENOUS
  Filled 2016-10-02: qty 150

## 2016-10-02 MED ORDER — CEFTRIAXONE SODIUM 2 G IJ SOLR: 2.0000 g | Freq: Two times a day (BID) | INTRAMUSCULAR | Status: DC

## 2016-10-02 MED ORDER — SUCCINYLCHOLINE CHLORIDE 20 MG/ML IJ SOLN
100.0000 mg | Freq: Once | INTRAMUSCULAR | Status: AC
  Administered 2016-10-02: 100 mg via INTRAVENOUS

## 2016-10-02 MED ORDER — LORAZEPAM 2 MG/ML IJ SOLN
1.0000 mg | Freq: Once | INTRAMUSCULAR | Status: AC
  Administered 2016-10-02: 1 mg via INTRAVENOUS
  Filled 2016-10-02: qty 1

## 2016-10-02 MED ORDER — ORAL CARE MOUTH RINSE
15.0000 mL | OROMUCOSAL | Status: DC
  Administered 2016-10-02 – 2016-10-04 (×24): 15 mL via OROMUCOSAL

## 2016-10-02 MED ORDER — FENTANYL BOLUS VIA INFUSION
25.0000 ug | INTRAVENOUS | Status: DC | PRN
  Administered 2016-10-02: 25 ug via INTRAVENOUS
  Filled 2016-10-02: qty 25

## 2016-10-02 MED ORDER — CEFAZOLIN SODIUM-DEXTROSE 2-4 GM/100ML-% IV SOLN
2.0000 g | Freq: Three times a day (TID) | INTRAVENOUS | Status: DC
  Administered 2016-10-02 – 2016-10-10 (×23): 2 g via INTRAVENOUS
  Filled 2016-10-02 (×31): qty 100

## 2016-10-02 MED ORDER — MAGNESIUM SULFATE 2 GM/50ML IV SOLN
2.0000 g | Freq: Once | INTRAVENOUS | Status: AC
  Administered 2016-10-02: 2 g via INTRAVENOUS
  Filled 2016-10-02: qty 50

## 2016-10-02 MED ORDER — FAMOTIDINE IN NACL 20-0.9 MG/50ML-% IV SOLN
20.0000 mg | Freq: Two times a day (BID) | INTRAVENOUS | Status: DC
  Administered 2016-10-02 – 2016-10-04 (×6): 20 mg via INTRAVENOUS
  Filled 2016-10-02 (×6): qty 50

## 2016-10-02 MED ORDER — SODIUM CHLORIDE 0.9 % IV SOLN: 250.0000 mL | INTRAVENOUS | Status: DC | PRN

## 2016-10-02 MED ORDER — MIDAZOLAM HCL 2 MG/2ML IJ SOLN
INTRAMUSCULAR | Status: AC
  Filled 2016-10-02: qty 2

## 2016-10-02 MED ORDER — SODIUM CHLORIDE 0.9 % IV SOLN
250.0000 mL | INTRAVENOUS | Status: DC | PRN
  Administered 2016-10-03: 250 mL via INTRAVENOUS
  Administered 2016-10-04: 500 mL via INTRAVENOUS

## 2016-10-02 MED ORDER — MIDAZOLAM HCL 2 MG/2ML IJ SOLN
1.0000 mg | INTRAMUSCULAR | Status: DC | PRN
  Administered 2016-10-02: 1 mg via INTRAVENOUS

## 2016-10-02 MED ORDER — SODIUM CHLORIDE 0.9 % IV SOLN
30.0000 meq | Freq: Once | INTRAVENOUS | Status: AC
  Administered 2016-10-02: 30 meq via INTRAVENOUS
  Filled 2016-10-02: qty 15

## 2016-10-02 MED ORDER — VANCOMYCIN HCL IN DEXTROSE 1-5 GM/200ML-% IV SOLN
1000.0000 mg | Freq: Once | INTRAVENOUS | Status: AC
  Administered 2016-10-02: 1000 mg via INTRAVENOUS
  Filled 2016-10-02: qty 200

## 2016-10-02 MED ORDER — IOPAMIDOL (ISOVUE-370) INJECTION 76%
75.0000 mL | Freq: Once | INTRAVENOUS | Status: AC | PRN
  Administered 2016-10-02: 75 mL via INTRAVENOUS

## 2016-10-02 MED ORDER — PROPOFOL 1000 MG/100ML IV EMUL
INTRAVENOUS | Status: AC
  Administered 2016-10-02: 5 ug/kg/min via INTRAVENOUS
  Filled 2016-10-02: qty 100

## 2016-10-02 MED ORDER — FENTANYL CITRATE (PF) 100 MCG/2ML IJ SOLN: 50.0000 ug | INTRAMUSCULAR | Status: DC | PRN

## 2016-10-02 MED ORDER — PIPERACILLIN-TAZOBACTAM 3.375 G IVPB 30 MIN
3.3750 g | Freq: Once | INTRAVENOUS | Status: AC
  Administered 2016-10-02: 3.375 g via INTRAVENOUS
  Filled 2016-10-02: qty 50

## 2016-10-02 MED ORDER — ETOMIDATE 2 MG/ML IV SOLN
20.0000 mg | Freq: Once | INTRAVENOUS | Status: AC
  Administered 2016-10-02: 20 mg via INTRAVENOUS

## 2016-10-02 NOTE — ED Notes (Signed)
Called RT to help transport patient to the floor

## 2016-10-02 NOTE — H&P (Addendum)
[facility] H&P    ASSESSMENT/PLAN   70 year old male with acute respiratory failure, possible secondary to pneumonia and/or ARDS, complicating acute mental status changes, possible meningitis.  PULMONARY A:Acute hypoxic respiratory failure, etiology uncertain. -Bibasilar atelectasis. -Possible pneumonia. -Patient with high O2 requirements, suspect ARDS. P:   -We'll treat empirically with antibiotics. -Continue ventilator support.  CARDIOVASCULAR A: --Hypertension. -Increased troponins. -We'll trend troponins.  RENAL A:  --  GASTROINTESTINAL A:  --  HEMATOLOGIC A:  Leukocytosis, likely reactive. P:    INFECTIOUS A:  Possible pneumonia. -Possible meningitis. P:   Continue empiric antibiotics. -Check cultures. -Check flu panel.  Micro/culture results:  BCx2 -- UC -- Sputum--  Antibiotics: Zosyn Ceftriaxone Levaquin  ENDOCRINE A:  SSI.   P:     NEUROLOGIC A:  Acute metabolic encephalopathy. P:   Propofol for sedation. Neurology consulted.   MAJOR EVENTS/TEST RESULTS:   Best Practices  DVT Prophylaxis: held for possible LP.  GI Prophylaxis: famotidine.    ---------------------------------------  ---------------------------------------   Name: [name] MRN: [id number] DOB: 28-Jan-1946    ADMISSION DATE:  10/02/2016   CHIEF COMPLAINT:  Dyspnea.    HISTORY OF PRESENT ILLNESS:   The patient is a 70 year old male. He is currently intubated and on the ventilator, therefore, all history was obtained from the chart, from the patient's wife at bedside, and from staff. The patient apparently has chronic illness. He has severe peripheral neuropathy which makes it difficult to walk, he also has severe back pain. Because of this, he is on numerous pain medications, which make him somewhat woozy. Over the last few days. He was increasingly fatigued, weak and had difficulty walking. Patient's wife notes that he had  fallen a few times over the past few weeks. The patient's wife notes that yesterday he was standing on top of the desk to get something and he fell, he did not hit his head, he did not appear to hurt himself, after that he had some mild pain but no other particular complaints. Over later in the evening he was feeling increasingly fatigued, tired and had a fever. Early this morning. Patient had altered mental status, was not making sense, appeared pale and was unable to walk, therefore, the patient's wife called EMS. Upon arrival in the ER, his mental status and respiratory status continued to decline, therefore, the patient was acutely intubated. He was taken for CT scan of the chest, review these images shows that he has bibasilar atelectasis, otherwise unremarkable.  PAST MEDICAL HISTORY :  Past Medical History:  Diagnosis Date  . Chronic pain   . Coronary artery disease   . Diabetes mellitus without complication ([place])   . DJD (degenerative joint disease)   . GERD (gastroesophageal reflux disease)   . Hyperlipemia   . Hypertension   . Insomnia   . Neuropathy [facility])    Past Surgical History:  Procedure Laterality Date  . BACK SURGERY    . CARDIAC SURGERY    . JOINT REPLACEMENT     Prior to Admission medications   Medication Sig Start Date End Date Taking? Authorizing Provider  aspirin EC 81 MG tablet Take 1 tablet by mouth daily.   Yes Historical Provider, MD  Cyanocobalamin (RA VITAMIN B-12 TR) 1000 MCG TBCR Take 1 tablet by mouth daily.   Yes Historical Provider, MD  cyclobenzaprine (FLEXERIL) 10 MG tablet Take 1 tablet by mouth daily.   Yes Historical Provider, MD  DULoxetine (CYMBALTA) 60 MG capsule Take  1 capsule by mouth at bedtime. 07/29/15  Yes Historical Provider, MD  furosemide (LASIX) 40 MG tablet Take 1 tablet by mouth 2 (two) times daily. 03/19/16  Yes Historical Provider, MD  gabapentin (NEURONTIN) 600 MG tablet Take 1 tablet by mouth 3 (three) times daily. 11/21/12  Yes  Historical Provider, MD  lisinopril (PRINIVIL,ZESTRIL) 10 MG tablet Take 1 tablet by mouth daily. 02/10/16  Yes Historical Provider, MD  Loratadine 10 MG CAPS Take 1 capsule by mouth daily.   Yes Historical Provider, MD  meclizine (ANTIVERT) 25 MG tablet Take 1 tablet by mouth 3 (three) times daily. 03/19/16  Yes Historical Provider, MD  metFORMIN (GLUCOPHAGE) 1000 MG tablet Take 1 tablet by mouth 2 (two) times daily. With meals. 03/19/16  Yes Historical Provider, MD  Multiple Vitamin (MULTI-VITAMINS) TABS Take 1 tablet by mouth daily.   Yes Historical Provider, MD  omeprazole (PRILOSEC) 20 MG capsule Take 20 mg by mouth daily.   Yes Historical Provider, MD  Oxcarbazepine (TRILEPTAL) 300 MG tablet Take 1 tablet by mouth 2 (two) times daily. 12/05/12  Yes Historical Provider, MD  pravastatin (PRAVACHOL) 40 MG tablet Take 1 tablet by mouth at bedtime. 02/10/16  Yes Historical Provider, MD  methadone (DOLOPHINE) 5 MG tablet Take 1 tablet by mouth 3 (three) times daily. 09/02/16   Historical Provider, MD  metoprolol tartrate (LOPRESSOR) 25 MG tablet Take 1 tablet by mouth 2 (two) times daily. 07/20/16   Historical Provider, MD   Allergies  Allergen Reactions  . Acetaminophen-Codeine     Other reaction(s): Headache  . Rofecoxib Other (See Comments)    ulcers  . Tetracyclines & Related     FAMILY HISTORY:  No family history on file. SOCIAL HISTORY:  reports that he has never smoked. He has never used smokeless tobacco. He reports that he does not drink alcohol. His drug history is not on file.  REVIEW OF SYSTEMS:   Could not be obtained due to critical illness.   VITAL SIGNS: Temp:  [102 F (38.9 C)-105.7 F (40.9 C)] 102 F (38.9 C) (12/15 1243) Pulse Rate:  [58-132] 83 (12/15 1330) Resp:  [18-39] 22 (12/15 1330) BP: (111-196)/(48-100) 120/64 (12/15 1330) SpO2:  [83 %-98 %] 98 % (12/15 1330) FiO2 (%):  [100 %] 100 % (12/15 1036) Weight:  [122.5 kg (270 lb)] 122.5 kg (270 lb) (12/15  0812) HEMODYNAMICS:   VENTILATOR SETTINGS: Vent Mode: PRVC FiO2 (%):  [100 %] 100 % Set Rate:  [24 bmp] 24 bmp Vt Set:  [500 mL] 500 mL PEEP:  [10 cmH20] 10 cmH20 INTAKE / OUTPUT:  Intake/Output Summary (Last 24 hours) at 10/02/16 1359 Last data filed at 10/02/16 1001  Gross per 24 hour  Intake             2050 ml  Output                0 ml  Net             2050 ml    Physical Examination:   VS: BP 120/64   Pulse 83   Temp (!) 102 F (38.9 C) (Other (Comment)) Comment (Src): cooling blanket   Resp (!) 22   Ht '5\' 11"'$  (1.803 m)   Wt 122.5 kg (270 lb)   SpO2 98%   BMI 37.66 kg/m   General Appearance: No distress  Neuro:without focal findings, mental status reduced.  HEENT: PERRLA, EOM intact, no ptosis, no other lesions noticed;  Pulmonary: normal breath  sounds. CardiovascularNormal S1,S2.  No m/r/g.    Abdomen: Benign, Soft, non-tender. Renal:  No costovertebral tenderness  GU:  Not performed at this time. Endoc: No evident thyromegaly, no signs of acromegaly. Skin:   warm, no rashes, no ecchymosis  Extremities: normal, no cyanosis, clubbing, no edema, warm with normal capillary refill.    LABS: Reviewed   LABORATORY PANEL:   CBC  Recent Labs Lab 10/02/16 0816  WBC 14.2*  HGB 12.5*  HCT 36.8*  PLT 145*    Chemistries   Recent Labs Lab 10/02/16 0816  NA 133*  K 3.1*  CL 99*  CO2 22  GLUCOSE 195*  BUN 14  CREATININE 0.85  CALCIUM 8.4*  AST 97*  ALT 44  ALKPHOS 76  BILITOT 0.7    No results for input(s): GLUCAP in the last 168 hours.  Recent Labs Lab 10/02/16 1026  PHART 7.23*  PCO2ART 51*  PO2ART 62*    Recent Labs Lab 10/02/16 0816  AST 97*  ALT 44  ALKPHOS 76  BILITOT 0.7  ALBUMIN 3.5    Cardiac Enzymes  Recent Labs Lab 10/02/16 0816  TROPONINI 0.06*    RADIOLOGY:  Ct Head Wo Contrast  Result Date: 10/02/2016 CLINICAL DATA:  Shortness of Breath, fall, altered mental status EXAM: CT HEAD WITHOUT CONTRAST  TECHNIQUE: Contiguous axial images were obtained from the base of the skull through the vertex without intravenous contrast. COMPARISON:  06/18/2015 FINDINGS: Brain: No intracranial hemorrhage, mass effect or midline shift. Stable atrophy and chronic white matter disease. No definite acute cortical infarction. No mass lesion is noted on this unenhanced scan. Vascular: Mild atherosclerotic calcifications of carotid siphon. Skull: No skull fracture is noted. Sinuses/Orbits: There is mucosal thickening with small air-fluid level right maxillary sinus. Mucosal thickening noted bilateral ethmoid air cells. Partially visualized NG tube. Other: None IMPRESSION: No acute intracranial abnormality. No definite acute cortical infarction. Stable atrophy and chronic white matter disease. Paranasal sinuses disease as described above. Electronically Signed   By: Lahoma Crocker M.D.   On: 10/02/2016 12:30   Ct Angio Chest Pe W And/or Wo Contrast  Result Date: 10/02/2016 CLINICAL DATA:  Increased shortness of breath and respiratory distress. Altered mental status. Urinary incontinence. EXAM: CT ANGIOGRAPHY CHEST WITH CONTRAST TECHNIQUE: Multidetector CT imaging of the chest was performed using the standard protocol during bolus administration of intravenous contrast. Multiplanar CT image reconstructions and MIPs were obtained to evaluate the vascular anatomy. CONTRAST:  75 cc Isovue 370. COMPARISON:  None. FINDINGS: Cardiovascular: Image quality is rather degraded by respiratory motion making assessment of the segmental and subsegmental pulmonary arteries difficult. No central or lobar pulmonary embolus. Atherosclerotic calcification of the arterial vasculature, including coronary arteries. Heart is mildly enlarged. No pericardial effusion. Mediastinum/Nodes: No pathologically enlarged mediastinal, hilar or axillary lymph nodes. Nasogastric tube is seen in the esophagus, extending into the stomach. Lungs/Pleura: Image quality is  degraded by respiratory motion. Endotracheal tube terminates approximately 1 cm above the carina. There is bilateral lower lobe collapse/consolidation. No pleural fluid. Airway is otherwise unremarkable. Upper Abdomen: There is a great deal of artifact in the posterior upper abdomen due to the patient's arms. Visualized portions of the liver, adrenal glands, kidneys, spleen, pancreas, stomach and bowel are grossly unremarkable. Nasogastric terminates in the stomach. Musculoskeletal: No worrisome lytic or sclerotic lesions. Degenerative changes are seen in the spine. Review of the MIP images confirms the above findings. IMPRESSION: 1. Image quality is degraded by respiratory motion, limiting evaluation for segmental and  subsegmental pulmonary emboli. No central or lobar pulmonary embolus. 2. Endotracheal tube terminates approximately 1 cm above the carina. Consider retracting approximately 2-3 cm for better positioning, as clinically indicated. 3. Bilateral lower lobe dependent collapse/consolidation, the majority of which likely represents atelectasis. Difficult to exclude aspiration. 4. Aortic atherosclerosis (ICD10-170.0). Coronary artery calcification. Electronically Signed   By: Lorin Picket M.D.   On: 10/02/2016 12:32   Dg Chest Portable 1 View  Result Date: 10/02/2016 CLINICAL DATA:  Status post intubation and nasogastric tube placement. Sepsis. EXAM: PORTABLE CHEST 1 VIEW COMPARISON:  Radiograph of same day. FINDINGS: Stable cardiomediastinal silhouette. Endotracheal tube is seen projected over tracheal air shadow with distal tip 2 cm above the carina. Nasogastric tube tip is seen entering stomach. No pneumothorax or significant pleural effusion is noted. Mild diffuse interstitial densities are noted concerning for pulmonary edema. Atherosclerosis of thoracic aorta is noted. Bony thorax is unremarkable. IMPRESSION: Endotracheal and nasogastric tubes in grossly good position. Stable probable bilateral  pulmonary edema. Electronically Signed   By: Marijo Conception, M.D.   On: 10/02/2016 10:53   Dg Chest Port 1 View  Result Date: 10/02/2016 CLINICAL DATA:  Altered mental status EXAM: PORTABLE CHEST 1 VIEW COMPARISON:  Mar 07, 2008 FINDINGS: There is mild interstitial edema. There is no airspace consolidation. Heart is mildly enlarged with mild pulmonary venous hypertension. Patient is status post median sternotomy. There is atherosclerotic calcification in the aorta. There is postoperative change in lower cervical spine region. IMPRESSION: Findings indicative of a degree of congestive heart failure. No airspace consolidation. Aortic atherosclerosis. Electronically Signed   By: Lowella Grip III M.D.   On: 10/02/2016 08:32       --Marda Stalker, MD.  Board Certified in Internal Medicine, Pulmonary Medicine, [place], and Sleep Medicine.  ICU Pager [phone] [facility] Office Number: [phone]  Patricia Pesa, M.D.  Vilinda Boehringer, M.D.  Merton Border, M.D   10/02/2016, 1:59 PM   [place].  I have personally obtained a history, examined the patient, evaluated laboratory and imaging results, formulated the assessment and plan and placed orders. The Patient requires high complexity decision making for assessment and support, frequent evaluation and titration of therapies, application of advanced monitoring technologies and extensive interpretation of multiple databases. The patient has critical illness that could lead imminently to failure of 1 or more organ systems and requires the highest level of physician preparedness to intervene.  Critical Care Time devoted to patient care services described in this note is 45 minutes and is exclusive of time spent in procedures.

## 2016-10-02 NOTE — Progress Notes (Addendum)
ANTIBIOTIC CONSULT NOTE - INITIAL  Pharmacy Consult for Cefazolin, Acyclovir Indication: bacteremia, suspected meningitis  Allergies  Allergen Reactions  . Acetaminophen-Codeine     Other reaction(s): Headache  . Rofecoxib Other (See Comments)    ulcers  . Tetracyclines & Related     Patient Measurements: Height: '5\' 11"'$  (180.3 cm) Weight: 270 lb (122.5 kg) IBW/kg (Calculated) : 75.3 Adjusted Body Weight: 94.2 kg   Vital Signs: Temp: 100.8 F (38.2 C) (12/15 2000) Temp Source: Core (Comment) (12/15 1801) BP: 104/61 (12/15 2000) Pulse Rate: 84 (12/15 2000) Intake/Output from previous day: No intake/output data recorded. Intake/Output from this shift: No intake/output data recorded.  Labs:  Recent Labs  10/02/16 0816 10/02/16 1826  WBC 14.2* 12.2*  HGB 12.5* 13.5  PLT 145* 117*  CREATININE 0.85 0.78   Estimated Creatinine Clearance: 114.5 mL/min (by C-G formula based on SCr of 0.78 mg/dL). No results for input(s): VANCOTROUGH, VANCOPEAK, VANCORANDOM, GENTTROUGH, GENTPEAK, GENTRANDOM, TOBRATROUGH, TOBRAPEAK, TOBRARND, AMIKACINPEAK, AMIKACINTROU, AMIKACIN in the last 72 hours.   Microbiology: Recent Results (from the past 720 hour(s))  Blood Culture (routine x 2)     Status: None (Preliminary result)   Collection Time: 10/02/16  8:16 AM  Result Value Ref Range Status   Specimen Description BLOOD RIGHT ARM  Final   Special Requests   Final    BOTTLES DRAWN AEROBIC AND ANAEROBIC  AER 8CC ANA 10CC   Culture  Setup Time   Final    GRAM POSITIVE COCCI IN BOTH AEROBIC AND ANAEROBIC BOTTLES CRITICAL VALUE NOTED.  VALUE IS CONSISTENT WITH PREVIOUSLY REPORTED AND CALLED VALUE.    Culture GRAM POSITIVE COCCI  Final   Report Status PENDING  Incomplete  Blood Culture (routine x 2)     Status: None (Preliminary result)   Collection Time: 10/02/16  8:16 AM  Result Value Ref Range Status   Specimen Description BLOOD LEFT ANTECUBITAL  Final   Special Requests   Final   BOTTLES DRAWN AEROBIC AND ANAEROBIC  AER 11CC ANA 12CC   Culture  Setup Time   Final    Organism ID to follow GRAM POSITIVE COCCI IN BOTH AEROBIC AND ANAEROBIC BOTTLES CRITICAL RESULT CALLED TO, READ BACK BY AND VERIFIED WITH: Carmin Richmond @ [id number] 10/02/16 by [facility]    Culture [place]  Final   Report Status PENDING  Incomplete  Blood Culture ID Panel (Reflexed)     Status: Abnormal   Collection Time: 10/02/16  8:16 AM  Result Value Ref Range Status   Enterococcus species NOT DETECTED NOT DETECTED Final   Listeria monocytogenes NOT DETECTED NOT DETECTED Final   Staphylococcus species DETECTED (A) NOT DETECTED Final    Comment: CRITICAL RESULT CALLED TO, READ BACK BY AND VERIFIED WITH: Carmin Richmond @ 0165 10/02/16 by [place]    Staphylococcus aureus DETECTED (A) NOT DETECTED Final    Comment: CRITICAL RESULT CALLED TO, READ BACK BY AND VERIFIED WITH: Carmin Richmond @ [phone] 10/02/16 by [facility]    Methicillin resistance NOT DETECTED NOT DETECTED Final   Streptococcus species NOT DETECTED NOT DETECTED Final   Streptococcus agalactiae NOT DETECTED NOT DETECTED Final   Streptococcus pneumoniae NOT DETECTED NOT DETECTED Final   Streptococcus pyogenes NOT DETECTED NOT DETECTED Final   Acinetobacter baumannii NOT DETECTED NOT DETECTED Final   Enterobacteriaceae species NOT DETECTED NOT DETECTED Final   Enterobacter cloacae complex NOT DETECTED NOT DETECTED Final   Escherichia coli NOT DETECTED NOT DETECTED Final   Klebsiella oxytoca NOT DETECTED  NOT DETECTED Final   Klebsiella pneumoniae NOT DETECTED NOT DETECTED Final   Proteus species NOT DETECTED NOT DETECTED Final   Serratia marcescens NOT DETECTED NOT DETECTED Final   Haemophilus influenzae NOT DETECTED NOT DETECTED Final   Neisseria meningitidis NOT DETECTED NOT DETECTED Final   Pseudomonas aeruginosa NOT DETECTED NOT DETECTED Final   Candida albicans NOT DETECTED NOT DETECTED Final   Candida glabrata NOT DETECTED NOT DETECTED  Final   Candida krusei NOT DETECTED NOT DETECTED Final   Candida parapsilosis NOT DETECTED NOT DETECTED Final   Candida tropicalis NOT DETECTED NOT DETECTED Final  MRSA PCR Screening     Status: None   Collection Time: 10/02/16  2:40 PM  Result Value Ref Range Status   MRSA by PCR NEGATIVE NEGATIVE Final    Comment:        The GeneXpert MRSA Assay (FDA approved for NASAL specimens only), is one component of a comprehensive MRSA colonization surveillance program. It is not intended to diagnose MRSA infection nor to guide or monitor treatment for MRSA infections.   CSF culture     Status: None (Preliminary result)   Collection Time: 10/02/16  3:13 PM  Result Value Ref Range Status   Specimen Description CSF  Final   Special Requests Normal  Final   Gram Stain   Final    RARE WBC SEEN RARE RED BLOOD CELLS NO ORGANISMS SEEN    Culture PENDING  Incomplete   Report Status PENDING  Incomplete  Culture, expectorated sputum-assessment     Status: None   Collection Time: 10/02/16  6:18 PM  Result Value Ref Range Status   Specimen Description TRACHEAL ASPIRATE  Final   Special Requests NONE  Final   Sputum evaluation THIS SPECIMEN IS ACCEPTABLE FOR SPUTUM CULTURE  Final   Report Status 10/02/2016 FINAL  Final    Medical History: Past Medical History:  Diagnosis Date  . Chronic pain   . Coronary artery disease   . Diabetes mellitus without complication ([place])   . DJD (degenerative joint disease)   . GERD (gastroesophageal reflux disease)   . Hyperlipemia   . Hypertension   . Insomnia   . Neuropathy ([place])     Medications:  Prescriptions Prior to Admission  Medication Sig Dispense Refill Last Dose  . aspirin EC 81 MG tablet Take 1 tablet by mouth daily.   Unknown at Unknown  . Cyanocobalamin (RA VITAMIN B-12 TR) 1000 MCG TBCR Take 1 tablet by mouth daily.   Unknown at Unknown  . cyclobenzaprine (FLEXERIL) 10 MG tablet Take 1 tablet by mouth daily.   Unknown at Unknown  .  DULoxetine (CYMBALTA) 60 MG capsule Take 1 capsule by mouth at bedtime.   Unknown at Unknown  . furosemide (LASIX) 40 MG tablet Take 1 tablet by mouth 2 (two) times daily.   Unknown at Unknown  . gabapentin (NEURONTIN) 600 MG tablet Take 1 tablet by mouth 3 (three) times daily.   Unknown at Unknown  . lisinopril (PRINIVIL,ZESTRIL) 10 MG tablet Take 1 tablet by mouth daily.   Unknown at Unknown  . Loratadine 10 MG CAPS Take 1 capsule by mouth daily.   Unknown at Unknown  . meclizine (ANTIVERT) 25 MG tablet Take 1 tablet by mouth 3 (three) times daily.   Unknown at Unknown  . metFORMIN (GLUCOPHAGE) 1000 MG tablet Take 1 tablet by mouth 2 (two) times daily. With meals.   Unknown at Unknown  . Multiple Vitamin (MULTI-VITAMINS) TABS Take 1  tablet by mouth daily.   Unknown at Unknown  . omeprazole (PRILOSEC) 20 MG capsule Take 20 mg by mouth daily.   Unknown at Unknown  . Oxcarbazepine (TRILEPTAL) 300 MG tablet Take 1 tablet by mouth 2 (two) times daily.   Unknown at Unknown  . pravastatin (PRAVACHOL) 40 MG tablet Take 1 tablet by mouth at bedtime.    at Unknown  . methadone (DOLOPHINE) 5 MG tablet Take 1 tablet by mouth 3 (three) times daily.   Unknown at Unknown  . metoprolol tartrate (LOPRESSOR) 25 MG tablet Take 1 tablet by mouth 2 (two) times daily.   Unknown at Unknown   Assessment: TBW = 122.5 kg ,  CrCl = 114.5 ml/min   Goal of Therapy:  resolution of infection  Plan:  Expected duration 7 days with resolution of temperature and/or normalization of WBC   Will start Cefazolin 2 gm IV Q8H on 12/15 @ 23:00.   Pt received Acyclovir 750 mg IV on 12/15 @ 20:30. Will start Acyclovir 950 mg IV Q8H on 12/16 @ 4:30.  ( using AdjBW to dose).   Talise Sligh D 10/02/2016,11:11 PM

## 2016-10-02 NOTE — ED Notes (Signed)
Ice packs applied to patients groin and axillary area

## 2016-10-02 NOTE — ED Notes (Signed)
Patient being transported to CT by RN and RT

## 2016-10-02 NOTE — Consult Note (Signed)
[facility] Infectious Disease     Reason for Consult : Possible meningitis   Referring Physician: Felicie Morn,  Date of Admission:  10/02/2016   Active Problems:   Sepsis ([place])  HPI: [name] is a 70 y.o. male admitted with AMS after being found confused by his wife.  He was apparently acting a little confused over the last few days. On admit temp 105.7. WBC 14K. He had a fall a few days ago and has some abrasions on his legs, near his prosthetic joint. There was some concern for seizures. Since admit has had LP which is relatively normal with only 14 wbc. BCX + GPC with MSSA   Past Medical History:  Diagnosis Date  . Chronic pain   . Coronary artery disease   . Diabetes mellitus without complication ([place])   . DJD (degenerative joint disease)   . GERD (gastroesophageal reflux disease)   . Hyperlipemia   . Hypertension   . Insomnia   . Neuropathy [facility])    Past Surgical History:  Procedure Laterality Date  . BACK SURGERY    . CARDIAC SURGERY    . JOINT REPLACEMENT     Social History  Substance Use Topics  . Smoking status: Never Smoker  . Smokeless tobacco: Never Used  . Alcohol use No   No family history on file.  Allergies:  Allergies  Allergen Reactions  . Acetaminophen-Codeine     Other reaction(s): Headache  . Rofecoxib Other (See Comments)    ulcers  . Tetracyclines & Related     Current antibiotics: Antibiotics Given (last 72 hours)    Date/Time Action Medication Dose Rate   10/02/16 1331 Given   cefTRIAXone (ROCEPHIN) IVPB 2 g 2 g 100 mL/hr      MEDICATIONS: . acetaminophen      . ampicillin (OMNIPEN) IV  2 g Intravenous Q4H  . cefTRIAXone  2 g Intravenous Q12H  . famotidine (PEPCID) IV  20 mg Intravenous Q12H  . fentaNYL (SUBLIMAZE) injection  50 mcg Intravenous Once  . insulin aspart  2-6 Units Subcutaneous Q4H  . ipratropium-albuterol  3 mL Nebulization Q6H  . potassium chloride (KCL MULTIRUN) 30 mEq in 265 mL IVPB  30 mEq  Intravenous Once  . senna-docusate  1 tablet Oral BID    Review of Systems -unable to obtain  OBJECTIVE: Temp:  [100.6 F (38.1 C)-105.7 F (40.9 C)] 100.6 F (38.1 C) (12/15 1500) Pulse Rate:  [58-132] 88 (12/15 1500) Resp:  [18-39] 21 (12/15 1500) BP: (106-196)/(48-100) 106/66 (12/15 1500) SpO2:  [83 %-99 %] 96 % (12/15 1500) FiO2 (%):  [50 %-100 %] 50 % (12/15 1452) Weight:  [122.5 kg (270 lb)] 122.5 kg (270 lb) (12/15 7035) Physical Exam  Constitutional: intubated, sedated  HENT: anicteric  Mouth/Throat: ETT in place  Cardiovascular: Tachy, reg Pulmonary/Chest: mech breath sounds .  Abdominal: Soft. Bowel sounds are normal. He exhibits no distension. There is no tenderness.  Lymphadenopathy: He has no cervical adenopathy.  Neurological:intubated Ext L knee prosthetic joint  Skin: bil Le abrasions  Psychiatric: intubated  LABS: Results for orders placed or performed during the hospital encounter of 10/02/16 (from the past 48 hour(s))  Lactic acid, plasma     Status: Abnormal   Collection Time: 10/02/16  8:16 AM  Result Value Ref Range   Lactic Acid, Venous 4.5 (HH) 0.5 - 1.9 mmol/L    Comment: CRITICAL RESULT CALLED TO, READ BACK BY AND VERIFIED WITH ANGELA ROBBINS 10/02/16 0905 SGD  Comprehensive metabolic panel     Status: Abnormal   Collection Time: 10/02/16  8:16 AM  Result Value Ref Range   Sodium 133 (L) 135 - 145 mmol/L   Potassium 3.1 (L) 3.5 - 5.1 mmol/L   Chloride 99 (L) 101 - 111 mmol/L   CO2 22 22 - 32 mmol/L   Glucose, Bld 195 (H) 65 - 99 mg/dL   BUN 14 6 - 20 mg/dL   Creatinine, Ser 0.85 0.61 - 1.24 mg/dL   Calcium 8.4 (L) 8.9 - 10.3 mg/dL   Total Protein 7.0 6.5 - 8.1 g/dL   Albumin 3.5 3.5 - 5.0 g/dL   AST 97 (H) 15 - 41 U/L   ALT 44 17 - 63 U/L   Alkaline Phosphatase 76 38 - 126 U/L   Total Bilirubin 0.7 0.3 - 1.2 mg/dL   GFR calc non Af Amer >60 >60 mL/min   GFR calc Af Amer >60 >60 mL/min    Comment: (NOTE) The eGFR has been calculated  using the CKD EPI equation. This calculation has not been validated in all clinical situations. eGFR's persistently <60 mL/min signify possible Chronic Kidney Disease.    Anion gap 12 5 - 15  Troponin I     Status: Abnormal   Collection Time: 10/02/16  8:16 AM  Result Value Ref Range   Troponin I 0.06 (HH) <0.03 ng/mL    Comment: CRITICAL RESULT CALLED TO, READ BACK BY AND VERIFIED WITH ANGELA ROBBINS 10/02/16 0910 SGD   CBC WITH DIFFERENTIAL     Status: Abnormal   Collection Time: 10/02/16  8:16 AM  Result Value Ref Range   WBC 14.2 (H) 3.8 - 10.6 K/uL   RBC 4.31 (L) 4.40 - 5.90 MIL/uL   Hemoglobin 12.5 (L) 13.0 - 18.0 g/dL   HCT 36.8 (L) 40.0 - 52.0 %   MCV 85.4 80.0 - 100.0 fL   MCH 29.1 26.0 - 34.0 pg   MCHC 34.1 32.0 - 36.0 g/dL   RDW 15.7 (H) 11.5 - 14.5 %   Platelets 145 (L) 150 - 440 K/uL   Neutrophils Relative % 94 %   Neutro Abs 13.3 (H) 1.4 - 6.5 K/uL   Lymphocytes Relative 2 %   Lymphs Abs 0.3 (L) 1.0 - 3.6 K/uL   Monocytes Relative 4 %   Monocytes Absolute 0.6 0.2 - 1.0 K/uL   Eosinophils Relative 0 %   Eosinophils Absolute 0.0 0 - 0.7 K/uL   Basophils Relative 0 %   Basophils Absolute 0.0 0 - 0.1 K/uL  Blood gas, venous ([facility], [facility], [facility])     Status: Abnormal   Collection Time: 10/02/16  8:16 AM  Result Value Ref Range   FIO2 21.00    pH, Ven 7.50 (H) 7.250 - 7.430   pCO2, Ven 28 (L) 44.0 - 60.0 mmHg   pO2, Ven 31.0 (LL) 32.0 - 45.0 mmHg   Bicarbonate 21.8 20.0 - 28.0 mmol/L   Acid-base deficit 0.4 0.0 - 2.0 mmol/L   O2 Saturation 67.0 %   Patient temperature 37.0    Sample type VENOUS   Blood Culture (routine x 2)     Status: None (Preliminary result)   Collection Time: 10/02/16  8:16 AM  Result Value Ref Range   Specimen Description BLOOD RIGHT ARM    Special Requests      BOTTLES DRAWN AEROBIC AND ANAEROBIC  AER 8CC ANA 10CC   Culture NO GROWTH < 12 HOURS    Report Status PENDING  Blood Culture (routine x 2)     Status: None (Preliminary result)    Collection Time: 10/02/16  8:16 AM  Result Value Ref Range   Specimen Description BLOOD LEFT ANTECUBITAL    Special Requests      BOTTLES DRAWN AEROBIC AND ANAEROBIC  AER 11CC ANA 12CC   Culture NO GROWTH < 12 HOURS    Report Status PENDING   Urinalysis, Complete w Microscopic     Status: Abnormal   Collection Time: 10/02/16  8:16 AM  Result Value Ref Range   Color, Urine YELLOW (A) YELLOW   APPearance CLEAR (A) CLEAR   Specific Gravity, Urine 1.019 1.005 - 1.030   pH 5.0 5.0 - 8.0   Glucose, UA NEGATIVE NEGATIVE mg/dL   Hgb urine dipstick NEGATIVE NEGATIVE   Bilirubin Urine NEGATIVE NEGATIVE   Ketones, ur 20 (A) NEGATIVE mg/dL   Protein, ur NEGATIVE NEGATIVE mg/dL   Nitrite NEGATIVE NEGATIVE   Leukocytes, UA NEGATIVE NEGATIVE   RBC / HPF 0-5 0 - 5 RBC/hpf   WBC, UA 0-5 0 - 5 WBC/hpf   Bacteria, UA NONE SEEN NONE SEEN   Squamous Epithelial / LPF NONE SEEN NONE SEEN   Mucous PRESENT   Protime-INR     Status: Abnormal   Collection Time: 10/02/16  8:16 AM  Result Value Ref Range   Prothrombin Time 15.5 (H) 11.4 - 15.2 seconds   INR 1.22   Procalcitonin - Baseline     Status: None   Collection Time: 10/02/16  8:16 AM  Result Value Ref Range   Procalcitonin 4.82 ng/mL    Comment:        Interpretation: PCT > 2 ng/mL: Systemic infection (sepsis) is likely, unless other causes are known. (NOTE)         ICU PCT Algorithm               Non ICU PCT Algorithm    ----------------------------     ------------------------------         PCT < 0.25 ng/mL                 PCT < 0.1 ng/mL     Stopping of antibiotics            Stopping of antibiotics       strongly encouraged.               strongly encouraged.    ----------------------------     ------------------------------       PCT level decrease by               PCT < 0.25 ng/mL       >= 80% from peak PCT       OR PCT 0.25 - 0.5 ng/mL          Stopping of antibiotics                                             encouraged.      Stopping of antibiotics           encouraged.    ----------------------------     ------------------------------       PCT level decrease by              PCT >= 0.25 ng/mL       < 80% from peak PCT  AND PCT >= 0.5 ng/mL            Continuing antibiotics                                               encouraged.       Continuing antibiotics            encouraged.    ----------------------------     ------------------------------     PCT level increase compared          PCT > 0.5 ng/mL         with peak PCT AND          PCT >= 0.5 ng/mL             Escalation of antibiotics                                          strongly encouraged.      Escalation of antibiotics        strongly encouraged.   Blood gas, arterial     Status: Abnormal   Collection Time: 10/02/16 10:26 AM  Result Value Ref Range   FIO2 100.00    Mode PRESSURE REGULATED VOLUME CONTROL    VT 500 mL   LHR 24 resp/min   Peep/cpap 5.0 cm H20   pH, Arterial 7.23 (L) 7.350 - 7.450   pCO2 arterial 51 (H) 32.0 - 48.0 mmHg   pO2, Arterial 62 (L) 83.0 - 108.0 mmHg   Bicarbonate 21.4 20.0 - 28.0 mmol/L   Acid-base deficit 6.5 (H) 0.0 - 2.0 mmol/L   O2 Saturation 86.3 %   Patient temperature 37.0    Collection site RIGHT RADIAL    Sample type ARTERIAL DRAW    Allens test (pass/fail) POSITIVE (A) PASS  Lactic acid, plasma     Status: Abnormal   Collection Time: 10/02/16 11:02 AM  Result Value Ref Range   Lactic Acid, Venous 2.5 (HH) 0.5 - 1.9 mmol/L    Comment: CRITICAL RESULT CALLED TO, READ BACK BY AND VERIFIED WITH ANGELA ROBBINS 10/02/16 1218 SGD   Influenza panel by PCR (type A & B, H1N1)     Status: None   Collection Time: 10/02/16 11:02 AM  Result Value Ref Range   Influenza A By PCR NEGATIVE NEGATIVE   Influenza B By PCR NEGATIVE NEGATIVE    Comment: (NOTE) The Xpert Xpress Flu assay is intended as an aid in the diagnosis of  influenza and should not be used as a sole basis for treatment.  This  assay  is FDA approved for nasopharyngeal swab specimens only. Nasal  washings and aspirates are unacceptable for Xpert Xpress Flu testing.   Glucose, capillary     Status: Abnormal   Collection Time: 10/02/16  2:38 PM  Result Value Ref Range   Glucose-Capillary 212 (H) 65 - 99 mg/dL  MRSA PCR Screening     Status: None   Collection Time: 10/02/16  2:40 PM  Result Value Ref Range   MRSA by PCR NEGATIVE NEGATIVE    Comment:        The GeneXpert MRSA Assay (FDA approved for NASAL specimens only), is one component of a comprehensive MRSA colonization surveillance program. It is not intended to  diagnose MRSA infection nor to guide or monitor treatment for MRSA infections.    No components found for: ESR, C REACTIVE PROTEIN MICRO: Recent Results (from the past 720 hour(s))  Blood Culture (routine x 2)     Status: None (Preliminary result)   Collection Time: 10/02/16  8:16 AM  Result Value Ref Range Status   Specimen Description BLOOD RIGHT ARM  Final   Special Requests   Final    BOTTLES DRAWN AEROBIC AND ANAEROBIC  AER 8CC ANA 10CC   Culture NO GROWTH < 12 HOURS  Final   Report Status PENDING  Incomplete  Blood Culture (routine x 2)     Status: None (Preliminary result)   Collection Time: 10/02/16  8:16 AM  Result Value Ref Range Status   Specimen Description BLOOD LEFT ANTECUBITAL  Final   Special Requests   Final    BOTTLES DRAWN AEROBIC AND ANAEROBIC  AER 11CC ANA 12CC   Culture NO GROWTH < 12 HOURS  Final   Report Status PENDING  Incomplete  MRSA PCR Screening     Status: None   Collection Time: 10/02/16  2:40 PM  Result Value Ref Range Status   MRSA by PCR NEGATIVE NEGATIVE Final    Comment:        The GeneXpert MRSA Assay (FDA approved for NASAL specimens only), is one component of a comprehensive MRSA colonization surveillance program. It is not intended to diagnose MRSA infection nor to guide or monitor treatment for MRSA infections.     IMAGING: Ct Head Wo  Contrast  Result Date: 10/02/2016 CLINICAL DATA:  Shortness of Breath, fall, altered mental status EXAM: CT HEAD WITHOUT CONTRAST TECHNIQUE: Contiguous axial images were obtained from the base of the skull through the vertex without intravenous contrast. COMPARISON:  06/18/2015 FINDINGS: Brain: No intracranial hemorrhage, mass effect or midline shift. Stable atrophy and chronic white matter disease. No definite acute cortical infarction. No mass lesion is noted on this unenhanced scan. Vascular: Mild atherosclerotic calcifications of carotid siphon. Skull: No skull fracture is noted. Sinuses/Orbits: There is mucosal thickening with small air-fluid level right maxillary sinus. Mucosal thickening noted bilateral ethmoid air cells. Partially visualized NG tube. Other: None IMPRESSION: No acute intracranial abnormality. No definite acute cortical infarction. Stable atrophy and chronic white matter disease. Paranasal sinuses disease as described above. Electronically Signed   By: Lahoma Crocker M.D.   On: 10/02/2016 12:30   Ct Angio Chest Pe W And/or Wo Contrast  Result Date: 10/02/2016 CLINICAL DATA:  Increased shortness of breath and respiratory distress. Altered mental status. Urinary incontinence. EXAM: CT ANGIOGRAPHY CHEST WITH CONTRAST TECHNIQUE: Multidetector CT imaging of the chest was performed using the standard protocol during bolus administration of intravenous contrast. Multiplanar CT image reconstructions and MIPs were obtained to evaluate the vascular anatomy. CONTRAST:  75 cc Isovue 370. COMPARISON:  None. FINDINGS: Cardiovascular: Image quality is rather degraded by respiratory motion making assessment of the segmental and subsegmental pulmonary arteries difficult. No central or lobar pulmonary embolus. Atherosclerotic calcification of the arterial vasculature, including coronary arteries. Heart is mildly enlarged. No pericardial effusion. Mediastinum/Nodes: No pathologically enlarged mediastinal,  hilar or axillary lymph nodes. Nasogastric tube is seen in the esophagus, extending into the stomach. Lungs/Pleura: Image quality is degraded by respiratory motion. Endotracheal tube terminates approximately 1 cm above the carina. There is bilateral lower lobe collapse/consolidation. No pleural fluid. Airway is otherwise unremarkable. Upper Abdomen: There is a great deal of artifact in the posterior upper abdomen due  to the patient's arms. Visualized portions of the liver, adrenal glands, kidneys, spleen, pancreas, stomach and bowel are grossly unremarkable. Nasogastric terminates in the stomach. Musculoskeletal: No worrisome lytic or sclerotic lesions. Degenerative changes are seen in the spine. Review of the MIP images confirms the above findings. IMPRESSION: 1. Image quality is degraded by respiratory motion, limiting evaluation for segmental and subsegmental pulmonary emboli. No central or lobar pulmonary embolus. 2. Endotracheal tube terminates approximately 1 cm above the carina. Consider retracting approximately 2-3 cm for better positioning, as clinically indicated. 3. Bilateral lower lobe dependent collapse/consolidation, the majority of which likely represents atelectasis. Difficult to exclude aspiration. 4. Aortic atherosclerosis (ICD10-170.0). Coronary artery calcification. Electronically Signed   By: Lorin Picket M.D.   On: 10/02/2016 12:32   Dg Chest Portable 1 View  Result Date: 10/02/2016 CLINICAL DATA:  Status post intubation and nasogastric tube placement. Sepsis. EXAM: PORTABLE CHEST 1 VIEW COMPARISON:  Radiograph of same day. FINDINGS: Stable cardiomediastinal silhouette. Endotracheal tube is seen projected over tracheal air shadow with distal tip 2 cm above the carina. Nasogastric tube tip is seen entering stomach. No pneumothorax or significant pleural effusion is noted. Mild diffuse interstitial densities are noted concerning for pulmonary edema. Atherosclerosis of thoracic aorta is  noted. Bony thorax is unremarkable. IMPRESSION: Endotracheal and nasogastric tubes in grossly good position. Stable probable bilateral pulmonary edema. Electronically Signed   By: Marijo Conception, M.D.   On: 10/02/2016 10:53   Dg Chest Port 1 View  Result Date: 10/02/2016 CLINICAL DATA:  Altered mental status EXAM: PORTABLE CHEST 1 VIEW COMPARISON:  Mar 07, 2008 FINDINGS: There is mild interstitial edema. There is no airspace consolidation. Heart is mildly enlarged with mild pulmonary venous hypertension. Patient is status post median sternotomy. There is atherosclerotic calcification in the aorta. There is postoperative change in lower cervical spine region. IMPRESSION: Findings indicative of a degree of congestive heart failure. No airspace consolidation. Aortic atherosclerosis. Electronically Signed   By: Lowella Grip III M.D.   On: 10/02/2016 08:32    Assessment:   OSBORNE SERIO is a 70 y.o. male admitted with sepsis and confusion. His LP is relatively unrevealing, CT chest with possible aspiration. BCX + GPC with MSSA Initially received empiric treatment for meningiits but CSF not consistent with this. I suspect he has staph bacteremia from the some abrasions on his legs after his fall Recommendations Change to ancef Dc other abx Will need repeat bcx Check echo Thank you very much for allowing me to participate in the care of this patient. Please call with questions.   Cheral Marker. Ola Spurr, MD

## 2016-10-02 NOTE — ED Notes (Signed)
Patient very agitated, pulling wires and clothing off, Dr. Jimmye Norman aware

## 2016-10-02 NOTE — Procedures (Signed)
Fluoro guded lumbar puncture at L3-$ level with acquisition of 3 cc clear colorless spinal fluid. See dictated Radiology report.

## 2016-10-02 NOTE — ED Notes (Signed)
Patient placed on cooling blanket.

## 2016-10-02 NOTE — Progress Notes (Signed)
PHARMACY - PHYSICIAN COMMUNICATION CRITICAL VALUE ALERT - BLOOD CULTURE IDENTIFICATION (BCID)  Results for orders placed or performed during the hospital encounter of 10/02/16  Blood Culture ID Panel (Reflexed) (Collected: 10/02/2016  8:16 AM)  Result Value Ref Range   Enterococcus species NOT DETECTED NOT DETECTED   Listeria monocytogenes NOT DETECTED NOT DETECTED   Staphylococcus species DETECTED (A) NOT DETECTED   Staphylococcus aureus DETECTED (A) NOT DETECTED   Methicillin resistance NOT DETECTED NOT DETECTED   Streptococcus species NOT DETECTED NOT DETECTED   Streptococcus agalactiae NOT DETECTED NOT DETECTED   Streptococcus pneumoniae NOT DETECTED NOT DETECTED   Streptococcus pyogenes NOT DETECTED NOT DETECTED   Acinetobacter baumannii NOT DETECTED NOT DETECTED   Enterobacteriaceae species NOT DETECTED NOT DETECTED   Enterobacter cloacae complex NOT DETECTED NOT DETECTED   Escherichia coli NOT DETECTED NOT DETECTED   Klebsiella oxytoca NOT DETECTED NOT DETECTED   Klebsiella pneumoniae NOT DETECTED NOT DETECTED   Proteus species NOT DETECTED NOT DETECTED   Serratia marcescens NOT DETECTED NOT DETECTED   Haemophilus influenzae NOT DETECTED NOT DETECTED   Neisseria meningitidis NOT DETECTED NOT DETECTED   Pseudomonas aeruginosa NOT DETECTED NOT DETECTED   Candida albicans NOT DETECTED NOT DETECTED   Candida glabrata NOT DETECTED NOT DETECTED   Candida krusei NOT DETECTED NOT DETECTED   Candida parapsilosis NOT DETECTED NOT DETECTED   Candida tropicalis NOT DETECTED NOT DETECTED    Name of physician (or Provider) Contacted: Dr. Geradine Girt   Changes to prescribed antibiotics required: none. Patient is currently on ampicillin, ceftriaxone, vancomycin, and acyclovir.   Loree Fee, PharmD 10/02/2016  7:32 PM

## 2016-10-02 NOTE — ED Notes (Signed)
RN in room with patient, Patient began to have agonal respirations, patient became diaphoretic, and pale. Dr. Jimmye Norman called to bedside, patient placed on non-breather.

## 2016-10-02 NOTE — ED Provider Notes (Signed)
[facility] Emergency Department Provider Note   Level V caveat: Review of systems and history is limited by altered mental status.     Time seen: ----------------------------------------- 8:08 AM on 10/02/2016 -----------------------------------------    I have reviewed the triage vital signs and the nursing notes.   HISTORY  Chief Complaint No chief complaint on file.    HPI [name] is a 70 y.o. male who presents to ER being brought from home for altered mental status. Wife states she last talked to him normallyat 8 PM last night. Patient was found somewhat confused and possibly post ictal by EMS. EMS reports he felt really hot and febrile. Patient had reported at one point difficulty urinating.  No past medical history on file.  There are no active problems to display for this patient.   No past surgical history on file.  Allergies Patient has no allergy information on record.  Social History Social History  Substance Use Topics  . Smoking status: Not on file  . Smokeless tobacco: Not on file  . Alcohol use Not on file    Review of Systems Unknown, possible fever and difficulty urinating  ____________________________________________   PHYSICAL EXAM:  VITAL SIGNS: ED Triage Vitals  Enc Vitals Group     BP      Pulse      Resp      Temp      Temp src      SpO2      Weight      Height      Head Circumference      Peak Flow      Pain Score      Pain Loc      Pain Edu?      Excl. in [place]?     Constitutional: Alert But disoriented, mild distress Eyes: Conjunctivae are normal. PERRL. Normal extraocular movements. ENT   Head: Normocephalic and atraumatic.   Nose: No congestion/rhinnorhea.   Mouth/Throat: Mucous membranes are moist.   Neck: No stridor. Cardiovascular: Rapid rate, regular rhythm. No murmurs, rubs, or gallops. Respiratory: Tachypnea with grossly clear breath sounds  bilaterally Gastrointestinal: Soft and nontender. Normal bowel sounds Musculoskeletal: Nontender with normal range of motion in all extremities. No lower extremity tenderness nor edema. Neurologic:  Patient is confused, No gross focal neurologic deficits are appreciated.  Skin:  Skin is warm, dry and intact. No rash noted. Warm to touch ____________________________________________  EKG: Interpreted by me. Sinus rhythm with a rate of 105 bpm, sinus arrhythmia, likely septal infarct age indeterminate nonspecific ST and T-wave changes. Normal axis.  ____________________________________________  ED COURSE:  Pertinent labs & imaging results that were available during my care of the patient were reviewed by me and considered in my medical decision making (see chart for details). Clinical Course as of Oct 02 1010  Fri Oct 02, 2016  0927 Patient is not hypotensive and his x-ray reveals signs of congestive heart failure. I will not be giving the 30 cc/kg sepsis protocol IV fluid bolus.  [JW]    Clinical Course User Index [JW] Earleen Newport, MD  Patient presents to ER likely with SIRS or sepsis. We will assess with labs and imaging.  Procedure Name: Intubation Date/Time: 10/02/2016 10:12 AM Performed by: Earleen Newport Pre-anesthesia Checklist: Emergency Drugs available Preoxygenation: Pre-oxygenation with 100% oxygen Intubation Type: Rapid sequence Laryngoscope Size: Mac and 4 Grade View: Grade I Tube size: 8.0 mm Number of attempts: 1 Airway Equipment and  Method: Oral airway Placement Confirmation: ETT inserted through vocal cords under direct vision Tube secured with: ETT holder Dental Injury: Teeth and Oropharynx as per pre-operative assessment  Difficulty Due To: Difficulty was unanticipated      ____________________________________________   LABS (pertinent positives/negatives)  Labs Reviewed  LACTIC ACID, PLASMA - Abnormal; Notable for the following:        Result Value   Lactic Acid, Venous 4.5 (*)    All other components within normal limits  COMPREHENSIVE METABOLIC PANEL - Abnormal; Notable for the following:    Sodium 133 (*)    Potassium 3.1 (*)    Chloride 99 (*)    Glucose, Bld 195 (*)    Calcium 8.4 (*)    AST 97 (*)    All other components within normal limits  TROPONIN I - Abnormal; Notable for the following:    Troponin I 0.06 (*)    All other components within normal limits  CBC WITH DIFFERENTIAL/PLATELET - Abnormal; Notable for the following:    WBC 14.2 (*)    RBC 4.31 (*)    Hemoglobin 12.5 (*)    HCT 36.8 (*)    RDW 15.7 (*)    Platelets 145 (*)    Neutro Abs 13.3 (*)    Lymphs Abs 0.3 (*)    All other components within normal limits  BLOOD GAS, VENOUS - Abnormal; Notable for the following:    pH, Ven 7.50 (*)    pCO2, Ven 28 (*)    pO2, Ven 31.0 (*)    All other components within normal limits  CULTURE, BLOOD (ROUTINE X 2)  CULTURE, BLOOD (ROUTINE X 2)  URINE CULTURE  LACTIC ACID, PLASMA  URINALYSIS, COMPLETE (UACMP) WITH MICROSCOPIC   CRITICAL CARE Performed by: Earleen Newport   Total critical care time: 30 minutes  Critical care time was exclusive of separately billable procedures and treating other patients.  Critical care was necessary to treat or prevent imminent or life-threatening deterioration.  Critical care was time spent personally by me on the following activities: development of treatment plan with patient and/or surrogate as well as nursing, discussions with consultants, evaluation of patient's response to treatment, examination of patient, obtaining history from patient or surrogate, ordering and performing treatments and interventions, ordering and review of laboratory studies, ordering and review of radiographic studies, pulse oximetry and re-evaluation of patient's condition.  RADIOLOGY Images were viewed by me  Chest x-ray IMPRESSION: Findings indicative of a degree of  congestive heart failure. No airspace consolidation. Aortic atherosclerosis. ____________________________________________  FINAL ASSESSMENT AND PLAN  Sepsis, acute respiratory failure, lactic acidosis, congestive heart failure  Plan: Patient with labs and imaging as dictated above. Patient had presented to the ER with delirium likely secondary to sepsis. Sepsis protocols were initiated. After approximately a liter of fluid as well as broad-spectrum antibiotics patient has significant shortness of breath possibly secondary to decompensated CHF. Decision was made as the patient was extremely agitated to intubate. Rapid sequence intubation was initiated. Infection source is likely urine. I will discuss the ICU doctors for admission.   Earleen Newport, MD   Note: This dictation was prepared with Dragon dictation. Any transcriptional errors that result from this process are unintentional    Earleen Newport, MD 10/02/16 1015

## 2016-10-02 NOTE — Consult Note (Signed)
Reason for Consult:AMS and fever Referring Physician: Ramachandran  CC: AMS and fever  HPI: [name] is an 70 y.o. male who is intubated and sedated.  Patient unable to provide history and family not present at this time.  All history obtained from the chart.  The patient fell off a desk two days ago.  Since that time he has had progressive weakness, ataxia and fatigue.  His speech was normal.  This morning he was confused with altered speech.  There was a question that the patient was post-ictal but no  evidence of seizure activity.  Patient was brought in for evaluation. Further decompensated in the ED and required intubation.  Patient was febrile in the ED.      Past Medical History:  Diagnosis Date  . Chronic pain   . Coronary artery disease   . Diabetes mellitus without complication (HCC)   . DJD (degenerative joint disease)   . GERD (gastroesophageal reflux disease)   . Hyperlipemia   . Hypertension   . Insomnia   . Neuropathy (HCC)     Past Surgical History:  Procedure Laterality Date  . BACK SURGERY    . CARDIAC SURGERY    . JOINT REPLACEMENT      Family history: Mother died of old age.  Father with CAD died of an MI.  Brother with COPD and pulmonary fibrosis.  Social History:  reports that he has never smoked. He has never used smokeless tobacco. He reports that he does not drink alcohol. His drug history is not on file.  Allergies  Allergen Reactions  . Acetaminophen-Codeine     Other reaction(s): Headache  . Rofecoxib Other (See Comments)    ulcers  . Tetracyclines & Related     Medications:  I have reviewed the patient's current medications. Prior to Admission:  Prescriptions Prior to Admission  Medication Sig Dispense Refill Last Dose  . aspirin EC 81 MG tablet Take 1 tablet by mouth daily.   Unknown at Unknown  . Cyanocobalamin (RA VITAMIN B-12 TR) 1000 MCG TBCR Take 1 tablet by mouth daily.   Unknown at Unknown  . cyclobenzaprine (FLEXERIL) 10 MG  tablet Take 1 tablet by mouth daily.   Unknown at Unknown  . DULoxetine (CYMBALTA) 60 MG capsule Take 1 capsule by mouth at bedtime.   Unknown at Unknown  . furosemide (LASIX) 40 MG tablet Take 1 tablet by mouth 2 (two) times daily.   Unknown at Unknown  . gabapentin (NEURONTIN) 600 MG tablet Take 1 tablet by mouth 3 (three) times daily.   Unknown at Unknown  . lisinopril (PRINIVIL,ZESTRIL) 10 MG tablet Take 1 tablet by mouth daily.   Unknown at Unknown  . Loratadine 10 MG CAPS Take 1 capsule by mouth daily.   Unknown at Unknown  . meclizine (ANTIVERT) 25 MG tablet Take 1 tablet by mouth 3 (three) times daily.   Unknown at Unknown  . metFORMIN (GLUCOPHAGE) 1000 MG tablet Take 1 tablet by mouth 2 (two) times daily. With meals.   Unknown at Unknown  . Multiple Vitamin (MULTI-VITAMINS) TABS Take 1 tablet by mouth daily.   Unknown at Unknown  . omeprazole (PRILOSEC) 20 MG capsule Take 20 mg by mouth daily.   Unknown at Unknown  . Oxcarbazepine (TRILEPTAL) 300 MG tablet Take 1 tablet by mouth 2 (two) times daily.   Unknown at Unknown  . pravastatin (PRAVACHOL) 40 MG tablet Take 1 tablet by mouth at bedtime.    at Unknown  .   methadone (DOLOPHINE) 5 MG tablet Take 1 tablet by mouth 3 (three) times daily.   Unknown at Unknown  . metoprolol tartrate (LOPRESSOR) 25 MG tablet Take 1 tablet by mouth 2 (two) times daily.   Unknown at Unknown   Scheduled: . chlorhexidine gluconate (MEDLINE KIT)  15 mL Mouth Rinse BID  . famotidine (PEPCID) IV  20 mg Intravenous Q12H  . fentaNYL (SUBLIMAZE) injection  50 mcg Intravenous Once  . insulin aspart  2-6 Units Subcutaneous Q4H  . ipratropium-albuterol  3 mL Nebulization Q6H  . mouth rinse  15 mL Mouth Rinse Q2H  . midazolam      . senna-docusate  1 tablet Oral BID  . vecuronium        ROS: Unable to provide  Physical Examination: Blood pressure 113/64, pulse 85, temperature (!) 102 F (38.9 C), temperature source Other (Comment), resp. rate (!) 28, height  5' 11" (1.803 m), weight 122.5 kg (270 lb), SpO2 99 %.  HEENT-  Normocephalic, no lesions, without obvious abnormality.  Normal external eye and conjunctiva.  Normal TM's bilaterally.  Normal auditory canals and external ears. Normal external nose, mucus membranes and septum.  Normal pharynx. Cardiovascular- S1, S2 normal, pulses palpable throughout   Lungs- chest clear, no wheezing, rales, normal symmetric air entry, Heart exam - S1, S2 normal, no murmur, no gallop, rate regular Abdomen- soft, non-tender; bowel sounds normal; no masses,  no organomegaly Extremities- no edema Lymph-no adenopathy palpable Musculoskeletal-no joint tenderness, deformity or swelling Skin-warm and dry, no hyperpigmentation, vitiligo, or suspicious lesions  Neurological Examination Mental Status: Patient does not respond to verbal stimuli.  Does not respond to deep sternal rub.  Does not follow commands.  No verbalizations noted.  Cranial Nerves: II: patient does not respond confrontation bilaterally, pupils right 2 mm, left 2 mm,and unreactive bilaterally III,IV,VI: doll's response absent bilaterally.  V,VII: corneal reflex present bilaterally  VIII: patient does not respond to verbal stimuli IX,X: gag reflex reduced, XI: trapezius strength unable to test bilaterally XII: tongue strength unable to test Motor: Extremities flaccid throughout.  No spontaneous movement noted.  No purposeful movements noted. Sensory: Does not respond to noxious stimuli in any extremity. Deep Tendon Reflexes:  1+ throughout. Plantars: Mute bilaterally Cerebellar: Unable to perform   Laboratory Studies:   Basic Metabolic Panel:  Recent Labs Lab 10/02/16 0816  NA 133*  K 3.1*  CL 99*  CO2 22  GLUCOSE 195*  BUN 14  CREATININE 0.85  CALCIUM 8.4*    Liver Function Tests:  Recent Labs Lab 10/02/16 0816  AST 97*  ALT 44  ALKPHOS 76  BILITOT 0.7  PROT 7.0  ALBUMIN 3.5   No results for input(s): LIPASE,  AMYLASE in the last 168 hours. No results for input(s): AMMONIA in the last 168 hours.  CBC:  Recent Labs Lab 10/02/16 0816  WBC 14.2*  NEUTROABS 13.3*  HGB 12.5*  HCT 36.8*  MCV 85.4  PLT 145*    Cardiac Enzymes:  Recent Labs Lab 10/02/16 0816  TROPONINI 0.06*    BNP: Invalid input(s): POCBNP  CBG:  Recent Labs Lab 10/02/16 1438  GLUCAP 212*    Microbiology: Results for orders placed or performed during the hospital encounter of 10/02/16  Blood Culture (routine x 2)     Status: None (Preliminary result)   Collection Time: 10/02/16  8:16 AM  Result Value Ref Range Status   Specimen Description BLOOD RIGHT ARM  Final   Special Requests   Final  BOTTLES DRAWN AEROBIC AND ANAEROBIC  AER 8CC ANA 10CC   Culture NO GROWTH < 12 HOURS  Final   Report Status PENDING  Incomplete  Blood Culture (routine x 2)     Status: None (Preliminary result)   Collection Time: 10/02/16  8:16 AM  Result Value Ref Range Status   Specimen Description BLOOD LEFT ANTECUBITAL  Final   Special Requests   Final    BOTTLES DRAWN AEROBIC AND ANAEROBIC  AER 11CC ANA 12CC   Culture NO GROWTH < 12 HOURS  Final   Report Status PENDING  Incomplete    Coagulation Studies:  Recent Labs  10/02/16 0816  LABPROT 15.5*  INR 1.22    Urinalysis:  Recent Labs Lab 10/02/16 0816  COLORURINE YELLOW*  LABSPEC 1.019  PHURINE 5.0  GLUCOSEU NEGATIVE  HGBUR NEGATIVE  BILIRUBINUR NEGATIVE  KETONESUR 20*  PROTEINUR NEGATIVE  NITRITE NEGATIVE  LEUKOCYTESUR NEGATIVE    Lipid Panel:  No results found for: CHOL, TRIG, HDL, CHOLHDL, VLDL, LDLCALC  HgbA1C: No results found for: HGBA1C  Urine Drug Screen:  No results found for: LABOPIA, COCAINSCRNUR, LABBENZ, AMPHETMU, THCU, LABBARB  Alcohol Level: No results for input(s): ETH in the last 168 hours.  Other results: EKG: Paced, 105 bpm.  Imaging: Ct Head Wo Contrast  Result Date: 10/02/2016 CLINICAL DATA:  Shortness of Breath, fall,  altered mental status EXAM: CT HEAD WITHOUT CONTRAST TECHNIQUE: Contiguous axial images were obtained from the base of the skull through the vertex without intravenous contrast. COMPARISON:  06/18/2015 FINDINGS: Brain: No intracranial hemorrhage, mass effect or midline shift. Stable atrophy and chronic white matter disease. No definite acute cortical infarction. No mass lesion is noted on this unenhanced scan. Vascular: Mild atherosclerotic calcifications of carotid siphon. Skull: No skull fracture is noted. Sinuses/Orbits: There is mucosal thickening with small air-fluid level right maxillary sinus. Mucosal thickening noted bilateral ethmoid air cells. Partially visualized NG tube. Other: None IMPRESSION: No acute intracranial abnormality. No definite acute cortical infarction. Stable atrophy and chronic white matter disease. Paranasal sinuses disease as described above. Electronically Signed   By: Lahoma Crocker M.D.   On: 10/02/2016 12:30   Ct Angio Chest Pe W And/or Wo Contrast  Result Date: 10/02/2016 CLINICAL DATA:  Increased shortness of breath and respiratory distress. Altered mental status. Urinary incontinence. EXAM: CT ANGIOGRAPHY CHEST WITH CONTRAST TECHNIQUE: Multidetector CT imaging of the chest was performed using the standard protocol during bolus administration of intravenous contrast. Multiplanar CT image reconstructions and MIPs were obtained to evaluate the vascular anatomy. CONTRAST:  75 cc Isovue 370. COMPARISON:  None. FINDINGS: Cardiovascular: Image quality is rather degraded by respiratory motion making assessment of the segmental and subsegmental pulmonary arteries difficult. No central or lobar pulmonary embolus. Atherosclerotic calcification of the arterial vasculature, including coronary arteries. Heart is mildly enlarged. No pericardial effusion. Mediastinum/Nodes: No pathologically enlarged mediastinal, hilar or axillary lymph nodes. Nasogastric tube is seen in the esophagus,  extending into the stomach. Lungs/Pleura: Image quality is degraded by respiratory motion. Endotracheal tube terminates approximately 1 cm above the carina. There is bilateral lower lobe collapse/consolidation. No pleural fluid. Airway is otherwise unremarkable. Upper Abdomen: There is a great deal of artifact in the posterior upper abdomen due to the patient's arms. Visualized portions of the liver, adrenal glands, kidneys, spleen, pancreas, stomach and bowel are grossly unremarkable. Nasogastric terminates in the stomach. Musculoskeletal: No worrisome lytic or sclerotic lesions. Degenerative changes are seen in the spine. Review of the MIP images confirms the  above findings. IMPRESSION: 1. Image quality is degraded by respiratory motion, limiting evaluation for segmental and subsegmental pulmonary emboli. No central or lobar pulmonary embolus. 2. Endotracheal tube terminates approximately 1 cm above the carina. Consider retracting approximately 2-3 cm for better positioning, as clinically indicated. 3. Bilateral lower lobe dependent collapse/consolidation, the majority of which likely represents atelectasis. Difficult to exclude aspiration. 4. Aortic atherosclerosis (ICD10-170.0). Coronary artery calcification. Electronically Signed   By: Melinda  Blietz M.D.   On: 10/02/2016 12:32   Dg Chest Portable 1 View  Result Date: 10/02/2016 CLINICAL DATA:  Status post intubation and nasogastric tube placement. Sepsis. EXAM: PORTABLE CHEST 1 VIEW COMPARISON:  Radiograph of same day. FINDINGS: Stable cardiomediastinal silhouette. Endotracheal tube is seen projected over tracheal air shadow with distal tip 2 cm above the carina. Nasogastric tube tip is seen entering stomach. No pneumothorax or significant pleural effusion is noted. Mild diffuse interstitial densities are noted concerning for pulmonary edema. Atherosclerosis of thoracic aorta is noted. Bony thorax is unremarkable. IMPRESSION: Endotracheal and nasogastric  tubes in grossly good position. Stable probable bilateral pulmonary edema. Electronically Signed   By: James  Green Jr, M.D.   On: 10/02/2016 10:53   Dg Chest Port 1 View  Result Date: 10/02/2016 CLINICAL DATA:  Altered mental status EXAM: PORTABLE CHEST 1 VIEW COMPARISON:  Mar 07, 2008 FINDINGS: There is mild interstitial edema. There is no airspace consolidation. Heart is mildly enlarged with mild pulmonary venous hypertension. Patient is status post median sternotomy. There is atherosclerotic calcification in the aorta. There is postoperative change in lower cervical spine region. IMPRESSION: Findings indicative of a degree of congestive heart failure. No airspace consolidation. Aortic atherosclerosis. Electronically Signed   By: William  Woodruff III M.D.   On: 10/02/2016 08:32     Assessment/Plan: 70 year old male with progressive decline over the past 28 hours now presenting with altered mental status and fever.  No clear source of fever found.  Concern is for the possibility of meningitis.  LP indicated.  On attempt at bedside patient with scar from previous surgery and subcutaneous scar tissue noted.  Patient more suitable for fluoro procedure.    Recommendations: 1.  LP under fluoro 2.  Broad spectrum antibiotics with viral coverage as well.  To be continued until more definitive identification of organism identity and source made. 3.  EEG  This patient is critically ill and at significant risk of neurological worsening, death and care requires constant monitoring of vital signs, hemodynamics,respiratory and cardiac monitoring, neurological assessment, discussion with family, other specialists and medical decision making of high complexity. I spent 40 minutes of neurocritical care time  in the care of  this patient.   , MD Neurology [phone] 10/02/2016, 3:16 PM      

## 2016-10-02 NOTE — Progress Notes (Signed)
ID brief note FUll note to follow. Pt seen. LP results pending Continue CNS dosing of ctx, and ampicillin  add vanco and acyclovir.

## 2016-10-02 NOTE — ED Notes (Signed)
Verbal order given by Dr. Jimmye Norman to place patient on 2 liter of O2 due to patient being tachypenic.

## 2016-10-02 NOTE — ED Notes (Signed)
Patient bucking vent, unable to titrate up on propofol due to patients blood pressure not able to tolerate it. Dr. Jimmye Norman informed and verbal order given for ativan 2 mg IV Stat.

## 2016-10-02 NOTE — ED Notes (Signed)
Dr. Jimmye Norman at bedside preparing to intubate patient

## 2016-10-02 NOTE — ED Notes (Signed)
Patient returned from CT

## 2016-10-02 NOTE — Progress Notes (Signed)
[place] for vancomycin dosing, acyclovir dosing, electrolyte management, constipation prevention.   Pharmacy consulted for above for 70 yo male ICU patient admitted with acute respiratory failure, ?meningitis/?PNA. Patient currently sedated with fentanyl drip.   Plan:   1. Vancomycin: patient received vancomycin 1g IV x 1 in ED. Will start patient on vancomycin '1750mg'$  IV Q12hr for goal trough of 15-20; dosing based on adjusted body weight. Will continue ceftriaxone 2g IV Q12hr and ampicillin 2g IV Q4hr for possible meningitis. If meningitis is r/o, please note patient's MRSA PCR is negative.    2. Acyclovir: using IBW will start patient on acyclovir '750mg'$  IV Q8hr.   3. Electrolytes: will order potassium 59mq IV x 1. Will recheck electrolytes with am labs.   Mg =1.7 goal >2. Will give 2g IV once. K=3.2. Pt currently receiving 30MEQ IV Recheck in AM  4. Constipation: will start patient on senna/docusate 1 tab BID.    Allergies  Allergen Reactions  . Acetaminophen-Codeine     Other reaction(s): Headache  . Rofecoxib Other (See Comments)    ulcers  . Tetracyclines & Related     Patient Measurements: Height: '5\' 11"'$  (180.3 cm) Weight: 270 lb (122.5 kg) IBW/kg (Calculated) : 75.3 Adjusted Body Weight: 94kg  Ideal Body Weight: 75kg  Vital Signs: Temp: 101.5 F (38.6 C) (12/15 1801) Temp Source: Core (Comment) (12/15 1801) BP: 134/57 (12/15 1801) Pulse Rate: 80 (12/15 1801) Intake/Output from previous day: No intake/output data recorded. Intake/Output from this shift: No intake/output data recorded.  Labs:  Recent Labs  10/02/16 0816 10/02/16 1826  WBC 14.2* 12.2*  HGB 12.5* 13.5  HCT 36.8* 40.9  PLT 145* 117*  CREATININE 0.85 0.78  MG  --  1.7  ALBUMIN 3.5 3.2*  PROT 7.0 6.9  AST 97* 122*  ALT 44 59  ALKPHOS 76 66  BILITOT 0.7 0.7   Estimated Creatinine Clearance: 114.5 mL/min (by C-G formula based on SCr of 0.78  mg/dL).   Microbiology: Recent Results (from the past 720 hour(s))  Blood Culture (routine x 2)     Status: None (Preliminary result)   Collection Time: 10/02/16  8:16 AM  Result Value Ref Range Status   Specimen Description BLOOD RIGHT ARM  Final   Special Requests   Final    BOTTLES DRAWN AEROBIC AND ANAEROBIC  AER 8CC ANA 10CC   Culture NO GROWTH < 12 HOURS  Final   Report Status PENDING  Incomplete  Blood Culture (routine x 2)     Status: None (Preliminary result)   Collection Time: 10/02/16  8:16 AM  Result Value Ref Range Status   Specimen Description BLOOD LEFT ANTECUBITAL  Final   Special Requests   Final    BOTTLES DRAWN AEROBIC AND ANAEROBIC  AER 11CC ANA 12CC   Culture  Setup Time   Final    Organism ID to follow GRAM POSITIVE COCCI ANAEROBIC BOTTLE ONLY CRITICAL RESULT CALLED TO, READ BACK BY AND VERIFIED WITH: KCarmin Richmond@ [id number]/15/17 [facility]   Culture [place] Final   Report Status PENDING  Incomplete  Blood Culture ID Panel (Reflexed)     Status: Abnormal   Collection Time: 10/02/16  8:16 AM  Result Value Ref Range Status   Enterococcus species NOT DETECTED NOT DETECTED Final   Listeria monocytogenes NOT DETECTED NOT DETECTED Final   Staphylococcus species DETECTED (A) NOT DETECTED Final    Comment: CRITICAL RESULT CALLED TO,  READ BACK BY AND VERIFIED WITH: Carmin Richmond @ 4034 10/02/16 by [place]    Staphylococcus aureus DETECTED (A) NOT DETECTED Final    Comment: CRITICAL RESULT CALLED TO, READ BACK BY AND VERIFIED WITH: Carmin Richmond @ 7425 10/02/16 by [facility]    Methicillin resistance NOT DETECTED NOT DETECTED Final   Streptococcus species NOT DETECTED NOT DETECTED Final   Streptococcus agalactiae NOT DETECTED NOT DETECTED Final   Streptococcus pneumoniae NOT DETECTED NOT DETECTED Final   Streptococcus pyogenes NOT DETECTED NOT DETECTED Final   Acinetobacter baumannii NOT DETECTED NOT DETECTED Final   Enterobacteriaceae species NOT  DETECTED NOT DETECTED Final   Enterobacter cloacae complex NOT DETECTED NOT DETECTED Final   Escherichia coli NOT DETECTED NOT DETECTED Final   Klebsiella oxytoca NOT DETECTED NOT DETECTED Final   Klebsiella pneumoniae NOT DETECTED NOT DETECTED Final   Proteus species NOT DETECTED NOT DETECTED Final   Serratia marcescens NOT DETECTED NOT DETECTED Final   Haemophilus influenzae NOT DETECTED NOT DETECTED Final   Neisseria meningitidis NOT DETECTED NOT DETECTED Final   Pseudomonas aeruginosa NOT DETECTED NOT DETECTED Final   Candida albicans NOT DETECTED NOT DETECTED Final   Candida glabrata NOT DETECTED NOT DETECTED Final   Candida krusei NOT DETECTED NOT DETECTED Final   Candida parapsilosis NOT DETECTED NOT DETECTED Final   Candida tropicalis NOT DETECTED NOT DETECTED Final  MRSA PCR Screening     Status: None   Collection Time: 10/02/16  2:40 PM  Result Value Ref Range Status   MRSA by PCR NEGATIVE NEGATIVE Final    Comment:        The GeneXpert MRSA Assay (FDA approved for NASAL specimens only), is one component of a comprehensive MRSA colonization surveillance program. It is not intended to diagnose MRSA infection nor to guide or monitor treatment for MRSA infections.   CSF culture     Status: None (Preliminary result)   Collection Time: 10/02/16  3:13 PM  Result Value Ref Range Status   Specimen Description CSF  Final   Special Requests Normal  Final   Gram Stain   Final    RARE WBC SEEN RARE RED BLOOD CELLS NO ORGANISMS SEEN    Culture PENDING  Incomplete   Report Status PENDING  Incomplete  Culture, expectorated sputum-assessment     Status: None   Collection Time: 10/02/16  6:18 PM  Result Value Ref Range Status   Specimen Description TRACHEAL ASPIRATE  Final   Special Requests NONE  Final   Sputum evaluation THIS SPECIMEN IS ACCEPTABLE FOR SPUTUM CULTURE  Final   Report Status 10/02/2016 FINAL  Final    Medical History: Past Medical History:  Diagnosis  Date  . Chronic pain   . Coronary artery disease   . Diabetes mellitus without complication ([place])   . DJD (degenerative joint disease)   . GERD (gastroesophageal reflux disease)   . Hyperlipemia   . Hypertension   . Insomnia   . Neuropathy [facility])    Pharmacy will continue to monitor and adjust per consult.   Melissa D Maccia 10/02/2016,7:20 PM

## 2016-10-02 NOTE — ED Notes (Signed)
Attempted to call report to ICU, RN unable to take report at this time, will call me back

## 2016-10-02 NOTE — ED Triage Notes (Signed)
Patient brought in by [facility] from home for Altered mental status. Per patient wife when she came in last night patient was in bed sleeping, during the night patient was up several times to use the bathroom. Patient urinated in the bed last night which is not normal for him. On arrival patient disoriented x 3.

## 2016-10-02 NOTE — ED Notes (Signed)
Patient still agitated, pulling monitors and gown off. Patient still disoriented. Verbal order given by Dr. Jimmye Norman for Ativan 1 mg and Haldol 2 mg.

## 2016-10-02 NOTE — Progress Notes (Signed)
Pt seen in ER, full H&P to follow.   Pt wife notes that pt fell off a desk 2 days ago. Has been having progressive weakness, ataxia, fatigue. This am, had altered mental status, fever, was complaining of headache.  Will start abx, check Ct head, consult neuro.   Ashby Dawes, M.D.  10/02/2016

## 2016-10-02 NOTE — Progress Notes (Signed)
[place] for vancomycin dosing, acyclovir dosing, electrolyte management, constipation prevention.   Pharmacy consulted for above for 70 yo male ICU patient admitted with acute respiratory failure, ?meningitis/?PNA. Patient currently sedated with fentanyl drip.   Plan:   1. Vancomycin: patient received vancomycin 1g IV x 1 in ED. Will start patient on vancomycin '1750mg'$  IV Q12hr for goal trough of 15-20; dosing based on adjusted body weight. Will continue ceftriaxone 2g IV Q12hr and ampicillin 2g IV Q4hr for possible meningitis. If meningitis is r/o, please note patient's MRSA PCR is negative.    2. Acyclovir: using IBW will start patient on acyclovir '750mg'$  IV Q8hr.   3. Electrolytes: will order potassium 58mq IV x 1. Will recheck electrolytes with am labs.    4. Constipation: will start patient on senna/docusate 1 tab BID.    Allergies  Allergen Reactions  . Acetaminophen-Codeine     Other reaction(s): Headache  . Rofecoxib Other (See Comments)    ulcers  . Tetracyclines & Related     Patient Measurements: Height: '5\' 11"'$  (180.3 cm) Weight: 270 lb (122.5 kg) IBW/kg (Calculated) : 75.3 Adjusted Body Weight: 94kg  Ideal Body Weight: 75kg  Vital Signs: Temp: 100.6 F (38.1 C) (12/15 1500) Temp Source: Core (Comment) (12/15 1500) BP: 106/66 (12/15 1500) Pulse Rate: 88 (12/15 1500) Intake/Output from previous day: No intake/output data recorded. Intake/Output from this shift: Total I/O In: 2050 [I.V.:1800; IV Piggyback:250] Out: 600 [Urine:600]  Labs:  Recent Labs  10/02/16 0816  WBC 14.2*  HGB 12.5*  HCT 36.8*  PLT 145*  CREATININE 0.85  ALBUMIN 3.5  PROT 7.0  AST 97*  ALT 44  ALKPHOS 76  BILITOT 0.7   Estimated Creatinine Clearance: 107.7 mL/min (by C-G formula based on SCr of 0.85 mg/dL).   Microbiology: Recent Results (from the past 720 hour(s))  Blood Culture (routine x 2)     Status: None (Preliminary result)    Collection Time: 10/02/16  8:16 AM  Result Value Ref Range Status   Specimen Description BLOOD RIGHT ARM  Final   Special Requests   Final    BOTTLES DRAWN AEROBIC AND ANAEROBIC  AER 8CC ANA 10CC   Culture NO GROWTH < 12 HOURS  Final   Report Status PENDING  Incomplete  Blood Culture (routine x 2)     Status: None (Preliminary result)   Collection Time: 10/02/16  8:16 AM  Result Value Ref Range Status   Specimen Description BLOOD LEFT ANTECUBITAL  Final   Special Requests   Final    BOTTLES DRAWN AEROBIC AND ANAEROBIC  AER 11CC ANA 12CC   Culture NO GROWTH < 12 HOURS  Final   Report Status PENDING  Incomplete  MRSA PCR Screening     Status: None   Collection Time: 10/02/16  2:40 PM  Result Value Ref Range Status   MRSA by PCR NEGATIVE NEGATIVE Final    Comment:        The GeneXpert MRSA Assay (FDA approved for NASAL specimens only), is one component of a comprehensive MRSA colonization surveillance program. It is not intended to diagnose MRSA infection nor to guide or monitor treatment for MRSA infections.     Medical History: Past Medical History:  Diagnosis Date  . Chronic pain   . Coronary artery disease   . [place]   . DJD (degenerative joint disease)   . GERD (gastroesophageal reflux disease)   . Hyperlipemia   .  Hypertension   . Insomnia   . Neuropathy [facility])    Pharmacy will continue to monitor and adjust per consult.   Simpson,Michael L 10/02/2016,4:15 PM

## 2016-10-03 ENCOUNTER — Inpatient Hospital Stay: Payer: PPO

## 2016-10-03 ENCOUNTER — Inpatient Hospital Stay (HOSPITAL_COMMUNITY)
Admit: 2016-10-03 | Discharge: 2016-10-03 | Disposition: A | Discharge status: Home / Self Care | Payer: PPO | Attending: Infectious Diseases | Admitting: Infectious Diseases

## 2016-10-03 DIAGNOSIS — R4189 Other symptoms and signs involving cognitive functions and awareness: Secondary | ICD-10-CM

## 2016-10-03 DIAGNOSIS — R509 Fever, unspecified: Secondary | ICD-10-CM

## 2016-10-03 DIAGNOSIS — R0602 Shortness of breath: Secondary | ICD-10-CM | POA: Diagnosis not present

## 2016-10-03 DIAGNOSIS — R6521 Severe sepsis with septic shock: Secondary | ICD-10-CM

## 2016-10-03 DIAGNOSIS — G9340 Encephalopathy, unspecified: Secondary | ICD-10-CM

## 2016-10-03 DIAGNOSIS — L8990 Pressure ulcer of unspecified site, unspecified stage: Secondary | ICD-10-CM | POA: Insufficient documentation

## 2016-10-03 DIAGNOSIS — A419 Sepsis, unspecified organism: Secondary | ICD-10-CM | POA: Diagnosis not present

## 2016-10-03 LAB — BLOOD GAS, ARTERIAL
ACID-BASE EXCESS: 0.5 mmol/L (ref 0.0–2.0)
Bicarbonate: 27 mmol/L (ref 20.0–28.0)
FIO2: 0.4
LHR: 15 {breaths}/min
O2 SAT: 96.9 %
PATIENT TEMPERATURE: 37
PCO2 ART: 50 mmHg — AB (ref 32.0–48.0)
PEEP: 8 cmH2O
PH ART: 7.34 — AB (ref 7.350–7.450)
PO2 ART: 95 mmHg (ref 83.0–108.0)
VT: 500 mL

## 2016-10-03 LAB — BASIC METABOLIC PANEL
ANION GAP: 9 (ref 5–15)
BUN: 13 mg/dL (ref 6–20)
CO2: 24 mmol/L (ref 22–32)
Calcium: 7.7 mg/dL — ABNORMAL LOW (ref 8.9–10.3)
Chloride: 104 mmol/L (ref 101–111)
Creatinine, Ser: 0.78 mg/dL (ref 0.61–1.24)
GFR calc Af Amer: >60 mL/min (ref >60)
GFR calc non Af Amer: >60 mL/min (ref >60)
GLUCOSE: 181 mg/dL — AB (ref 65–99)
POTASSIUM: 3.5 mmol/L (ref 3.5–5.1)
Sodium: 137 mmol/L (ref 135–145)

## 2016-10-03 LAB — PROCALCITONIN: Procalcitonin: 6.55 ng/mL

## 2016-10-03 LAB — URINE CULTURE: Culture: NO GROWTH

## 2016-10-03 LAB — GLUCOSE, CAPILLARY
GLUCOSE-CAPILLARY: 162 mg/dL — AB (ref 65–99)
Glucose-Capillary: 171 mg/dL — ABNORMAL HIGH (ref 65–99)
Glucose-Capillary: 174 mg/dL — ABNORMAL HIGH (ref 65–99)
Glucose-Capillary: 180 mg/dL — ABNORMAL HIGH (ref 65–99)
Glucose-Capillary: 183 mg/dL — ABNORMAL HIGH (ref 65–99)
Glucose-Capillary: 232 mg/dL — ABNORMAL HIGH (ref 65–99)

## 2016-10-03 LAB — CBC
HEMATOCRIT: 36.5 % — AB (ref 40.0–52.0)
Hemoglobin: 12.4 g/dL — ABNORMAL LOW (ref 13.0–18.0)
MCH: 29 pg (ref 26.0–34.0)
MCHC: 34.1 g/dL (ref 32.0–36.0)
MCV: 85 fL (ref 80.0–100.0)
Platelets: 113 10*3/uL — ABNORMAL LOW (ref 150–440)
RBC: 4.29 MIL/uL — AB (ref 4.40–5.90)
RDW: 15.7 % — AB (ref 11.5–14.5)
WBC: 7.5 10*3/uL (ref 3.8–10.6)

## 2016-10-03 LAB — MAGNESIUM: Magnesium: 2.2 mg/dL (ref 1.7–2.4)

## 2016-10-03 MED ORDER — METOPROLOL TARTRATE 5 MG/5ML IV SOLN
2.5000 mg | INTRAVENOUS | Status: DC | PRN
  Administered 2016-10-03: 5 mg via INTRAVENOUS
  Filled 2016-10-03: qty 5

## 2016-10-03 MED ORDER — AMIODARONE LOAD VIA INFUSION
150.0000 mg | Freq: Once | INTRAVENOUS | Status: AC
  Administered 2016-10-03: 150 mg via INTRAVENOUS
  Filled 2016-10-03: qty 83.34

## 2016-10-03 MED ORDER — AMIODARONE HCL IN DEXTROSE 360-4.14 MG/200ML-% IV SOLN
30.0000 mg/h | INTRAVENOUS | Status: DC
  Administered 2016-10-04: 30 mg/h via INTRAVENOUS
  Filled 2016-10-03 (×2): qty 200

## 2016-10-03 MED ORDER — IBUPROFEN 100 MG/5ML PO SUSP
400.0000 mg | Freq: Four times a day (QID) | ORAL | Status: DC | PRN
  Administered 2016-10-03 – 2016-10-04 (×4): 400 mg via ORAL
  Filled 2016-10-03 (×6): qty 20

## 2016-10-03 MED ORDER — CIPROFLOXACIN HCL 0.3 % OP SOLN
1.0000 [drp] | OPHTHALMIC | Status: DC
  Administered 2016-10-03 – 2016-10-10 (×40): 1 [drp] via OPHTHALMIC
  Filled 2016-10-03 (×2): qty 2.5

## 2016-10-03 MED ORDER — AMIODARONE HCL IN DEXTROSE 360-4.14 MG/200ML-% IV SOLN
60.0000 mg/h | INTRAVENOUS | Status: AC
  Administered 2016-10-03 (×2): 60 mg/h via INTRAVENOUS
  Filled 2016-10-03 (×2): qty 200

## 2016-10-03 MED ORDER — METOPROLOL TARTRATE 5 MG/5ML IV SOLN: 2.5000 mg | INTRAVENOUS | Status: DC | PRN

## 2016-10-03 MED ORDER — ENOXAPARIN SODIUM 40 MG/0.4ML ~~LOC~~ SOLN
40.0000 mg | SUBCUTANEOUS | Status: DC
  Administered 2016-10-03 – 2016-10-05 (×3): 40 mg via SUBCUTANEOUS
  Filled 2016-10-03 (×3): qty 0.4

## 2016-10-03 NOTE — Progress Notes (Signed)
Nutrition Follow-up  DOCUMENTATION CODES:   Obesity unspecified  INTERVENTION:  1. If unable to extubate, recommend begin Enteral Feeds with Vital High Protein, goal rate 55m/hr + Pro-Stat 365mQ24H -Provides 1780 calories, 162gm protein, and 1404cc free water  NUTRITION DIAGNOSIS:   Inadequate oral intake related to inability to eat as evidenced by NPO status  GOAL:   Provide needs based on ASPEN/SCCM guidelines  MONITOR:   I & O's, Labs, Weight trends, Vent status, TF tolerance  REASON FOR ASSESSMENT:   Ventilator    ASSESSMENT:   [name] with acute respiratory failure, possible secondary to pneumonia and/or ARDS, complicating acute mental status changes, possible meningitis.  Patient is currently intubated on ventilator support MV: 12 L/min Temp (24hrs), Avg:101 F (38.3 C), Min:99.9 F (37.7 C), Max:102.2 F (39 C) Propofol: none No wt hx available Possible meningitis Labs and medications reviewed: CBGs 162-183 Senokot-S Fentnyl gtt  Diet Order:  Diet NPO time specified  Skin:  Wound (see comment) (Stg II PU to Buttocks)  Last BM:  PTA  Height:   Ht Readings from Last 1 Encounters:  10/02/16 '5\' 11"'$  (1.803 m)    Weight:   Wt Readings from Last 1 Encounters:  10/03/16 279 lb 15.8 oz (127 kg)    Ideal Body Weight:  78.18 kg  BMI:  Body mass index is 39.05 kg/m.  Estimated Nutritional Needs:   Kcal:  132080-2233alories  Protein:  156  Fluid:  per MD/NP/PA  EDUCATION NEEDS:   No education needs identified at this time  WiSatira AnisWard, MS, RD LDN Inpatient Clinical Dietitian Pager [phone]

## 2016-10-03 NOTE — Progress Notes (Signed)
[place] for electrolyte management, constipation prevention.   Pharmacy consulted for above for 70 yo male ICU patient admitted with acute respiratory failure, ?meningitis/?PNA. Patient currently sedated with fentanyl drip.   Plan:     1. Electrolytes: Labs within normal limits.  Will recheck electrolytes with am labs.    2. Constipation: will continue senna/docusate 1 tablet BID   Allergies  Allergen Reactions  . Acetaminophen-Codeine     Other reaction(s): Headache  . Rofecoxib Other (See Comments)    ulcers  . Tetracyclines & Related     Patient Measurements: Height: '5\' 11"'$  (180.3 cm) Weight: 279 lb 15.8 oz (127 kg) IBW/kg (Calculated) : 75.3 Adjusted Body Weight: 94kg  Ideal Body Weight: 75kg  Vital Signs: Temp: 100.6 F (38.1 C) (12/16 0838) Temp Source: Core (Comment) (12/16 0800) BP: 112/62 (12/16 0800) Pulse Rate: 94 (12/16 0838) Intake/Output from previous day: 12/15 0701 - 12/16 0700 In: 3367.9 [I.V.:2102.9; IV Piggyback:1265] Out: 1700 [Urine:1700] Intake/Output from this shift: Total I/O In: 225 [I.V.:125; IV Piggyback:100] Out: 75 [Urine:75]  Labs:  Recent Labs  10/02/16 0816 10/02/16 1826 10/03/16 0433  WBC 14.2* 12.2* 7.5  HGB 12.5* 13.5 12.4*  HCT 36.8* 40.9 36.5*  PLT 145* 117* 113*  CREATININE 0.85 0.78 0.78  MG  --  1.7 2.2  ALBUMIN 3.5 3.2*  --   PROT 7.0 6.9  --   AST 97* 122*  --   ALT 44 59  --   ALKPHOS 76 66  --   BILITOT 0.7 0.7  --    Estimated Creatinine Clearance: 116.7 mL/min (by C-G formula based on SCr of 0.78 mg/dL).   Microbiology: Recent Results (from the past 720 hour(s))  Blood Culture (routine x 2)     Status: None (Preliminary result)   Collection Time: 10/02/16  8:16 AM  Result Value Ref Range Status   Specimen Description BLOOD RIGHT ARM  Final   Special Requests   Final    BOTTLES DRAWN AEROBIC AND ANAEROBIC  AER 8CC ANA 10CC   Culture  Setup Time   Final   GRAM POSITIVE COCCI IN BOTH AEROBIC AND ANAEROBIC BOTTLES CRITICAL VALUE NOTED.  VALUE IS CONSISTENT WITH PREVIOUSLY REPORTED AND CALLED VALUE.    Culture GRAM POSITIVE COCCI  Final   Report Status PENDING  Incomplete  Blood Culture (routine x 2)     Status: None (Preliminary result)   Collection Time: 10/02/16  8:16 AM  Result Value Ref Range Status   Specimen Description BLOOD LEFT ANTECUBITAL  Final   Special Requests   Final    BOTTLES DRAWN AEROBIC AND ANAEROBIC  AER 11CC ANA 12CC   Culture  Setup Time   Final    GRAM POSITIVE COCCI IN BOTH AEROBIC AND ANAEROBIC BOTTLES CRITICAL RESULT CALLED TO, READ BACK BY AND VERIFIED WITH: Carmin Richmond @ [phone] 10/02/16 by [facility] Performed at [facility]  Final   Report Status PENDING  Incomplete  Blood Culture ID Panel (Reflexed)     Status: Abnormal   Collection Time: 10/02/16  8:16 AM  Result Value Ref Range Status   Enterococcus species NOT DETECTED NOT DETECTED Final   Listeria monocytogenes NOT DETECTED NOT DETECTED Final   Staphylococcus species DETECTED (A) NOT DETECTED Final    Comment: CRITICAL RESULT CALLED TO, READ BACK BY AND VERIFIED WITH: Carmin Richmond @ 0630 10/02/16 by [place]    Staphylococcus aureus DETECTED (  A) NOT DETECTED Final    Comment: CRITICAL RESULT CALLED TO, READ BACK BY AND VERIFIED WITH: Carmin Richmond @ [phone] 10/02/16 by [facility]    Methicillin resistance NOT DETECTED NOT DETECTED Final   Streptococcus species NOT DETECTED NOT DETECTED Final   Streptococcus agalactiae NOT DETECTED NOT DETECTED Final   Streptococcus pneumoniae NOT DETECTED NOT DETECTED Final   Streptococcus pyogenes NOT DETECTED NOT DETECTED Final   Acinetobacter baumannii NOT DETECTED NOT DETECTED Final   Enterobacteriaceae species NOT DETECTED NOT DETECTED Final   Enterobacter cloacae complex NOT DETECTED NOT DETECTED Final   Escherichia coli NOT DETECTED NOT DETECTED Final   Klebsiella oxytoca NOT  DETECTED NOT DETECTED Final   Klebsiella pneumoniae NOT DETECTED NOT DETECTED Final   Proteus species NOT DETECTED NOT DETECTED Final   Serratia marcescens NOT DETECTED NOT DETECTED Final   Haemophilus influenzae NOT DETECTED NOT DETECTED Final   Neisseria meningitidis NOT DETECTED NOT DETECTED Final   Pseudomonas aeruginosa NOT DETECTED NOT DETECTED Final   Candida albicans NOT DETECTED NOT DETECTED Final   Candida glabrata NOT DETECTED NOT DETECTED Final   Candida krusei NOT DETECTED NOT DETECTED Final   Candida parapsilosis NOT DETECTED NOT DETECTED Final   Candida tropicalis NOT DETECTED NOT DETECTED Final  MRSA PCR Screening     Status: None   Collection Time: 10/02/16  2:40 PM  Result Value Ref Range Status   MRSA by PCR NEGATIVE NEGATIVE Final    Comment:        The GeneXpert MRSA Assay (FDA approved for NASAL specimens only), is one component of a comprehensive MRSA colonization surveillance program. It is not intended to diagnose MRSA infection nor to guide or monitor treatment for MRSA infections.   CSF culture     Status: None (Preliminary result)   Collection Time: 10/02/16  3:13 PM  Result Value Ref Range Status   Specimen Description CSF  Final   Special Requests Normal  Final   Gram Stain   Final    RARE WBC SEEN RARE RED BLOOD CELLS NO ORGANISMS SEEN    Culture PENDING  Incomplete   Report Status PENDING  Incomplete  Culture, expectorated sputum-assessment     Status: None   Collection Time: 10/02/16  6:18 PM  Result Value Ref Range Status   Specimen Description TRACHEAL ASPIRATE  Final   Special Requests NONE  Final   Sputum evaluation THIS SPECIMEN IS ACCEPTABLE FOR SPUTUM CULTURE  Final   Report Status 10/02/2016 FINAL  Final  Culture, respiratory (NON-Expectorated)     Status: None (Preliminary result)   Collection Time: 10/02/16  6:18 PM  Result Value Ref Range Status   Specimen Description TRACHEAL ASPIRATE  Final   Special Requests NONE  Reflexed from [id number]  Final   Gram Stain   Final    ABUNDANT WBC PRESENT, PREDOMINANTLY MONONUCLEAR FEW GRAM NEGATIVE COCCI IN PAIRS RARE GRAM POSITIVE RODS Performed at [facility]    Culture PENDING  Incomplete   Report Status PENDING  Incomplete    Medical History: Past Medical History:  Diagnosis Date  . Chronic pain   . Coronary artery disease   . Diabetes mellitus without complication ([place])   . DJD (degenerative joint disease)   . GERD (gastroesophageal reflux disease)   . Hyperlipemia   . Hypertension   . Insomnia   . Neuropathy [facility])    Pharmacy will continue to monitor and adjust per consult.   Yasuo Phimmasone D 10/03/2016,9:48 AM

## 2016-10-03 NOTE — Progress Notes (Signed)
[facility] H&P    ASSESSMENT/PLAN   71 year old male with acute respiratory failure, possible secondary to pneumonia and/or ARDS, complicating acute mental status changes, possible meningitis.  PULMONARY A:Acute hypoxic respiratory failure, etiology uncertain. -Bibasilar atelectasis. -Possible pneumonia. CXR reviewed, mild changes of bronchitis, ETT in position.  -Patient with high initial O2 requirements, suspect acute lung injury.  -Vent PRVC/24/500/10/FiO2 35%. P:   -We'll treat empirically with antibiotics. -Continue ventilator support-decrease PEEP to 5.  CARDIOVASCULAR A: --Hypertension. -Increased troponins. -We'll trend troponins.  RENAL A:  --  GASTROINTESTINAL A:  --  HEMATOLOGIC A:  Leukocytosis, likely reactive. P:    INFECTIOUS A:  Possible pneumonia. -Possible meningitis. P:   Continue empiric antibiotics. -Check cultures. -Continue antibiotics per ID  Micro/culture results:  BCx2 12/15--gram-positive cocci, PCR positive for staph aureus. UC 12/15 . Pending Sputum 12/15 --pending Flu negative.  CSF 12/15: Pending  Antibiotics: Zosyn 12/15>> 12/15 Levaquin 12/15>>12/15 Vancomycin 12/15>> Acyclovir 12/15>> Cefazolin 12/15>>   ENDOCRINE A:  SSI.   P:     NEUROLOGIC A:  Acute metabolic encephalopathy. P:   Propofol for sedation. Neurology consulted.   MAJOR EVENTS/TEST RESULTS: LP 12/15;   Best Practices  DVT Prophylaxis: enoxaparin GI Prophylaxis: famotidine.    ---------------------------------------  ---------------------------------------   Name: [name] MRN: [id number] DOB: Aug 15, 1946    ADMISSION DATE:  10/02/2016   Subjective: Pt sedated, on vent.   Prior to Admission medications   Medication Sig Start Date End Date Taking? Authorizing Provider  aspirin EC 81 MG tablet Take 1 tablet by mouth daily.   Yes Historical Provider, MD  Cyanocobalamin (RA VITAMIN B-12 TR) 1000  MCG TBCR Take 1 tablet by mouth daily.   Yes Historical Provider, MD  cyclobenzaprine (FLEXERIL) 10 MG tablet Take 1 tablet by mouth daily.   Yes Historical Provider, MD  DULoxetine (CYMBALTA) 60 MG capsule Take 1 capsule by mouth at bedtime. 07/29/15  Yes Historical Provider, MD  furosemide (LASIX) 40 MG tablet Take 1 tablet by mouth 2 (two) times daily. 03/19/16  Yes Historical Provider, MD  gabapentin (NEURONTIN) 600 MG tablet Take 1 tablet by mouth 3 (three) times daily. 11/21/12  Yes Historical Provider, MD  lisinopril (PRINIVIL,ZESTRIL) 10 MG tablet Take 1 tablet by mouth daily. 02/10/16  Yes Historical Provider, MD  Loratadine 10 MG CAPS Take 1 capsule by mouth daily.   Yes Historical Provider, MD  meclizine (ANTIVERT) 25 MG tablet Take 1 tablet by mouth 3 (three) times daily. 03/19/16  Yes Historical Provider, MD  metFORMIN (GLUCOPHAGE) 1000 MG tablet Take 1 tablet by mouth 2 (two) times daily. With meals. 03/19/16  Yes Historical Provider, MD  Multiple Vitamin (MULTI-VITAMINS) TABS Take 1 tablet by mouth daily.   Yes Historical Provider, MD  omeprazole (PRILOSEC) 20 MG capsule Take 20 mg by mouth daily.   Yes Historical Provider, MD  Oxcarbazepine (TRILEPTAL) 300 MG tablet Take 1 tablet by mouth 2 (two) times daily. 12/05/12  Yes Historical Provider, MD  pravastatin (PRAVACHOL) 40 MG tablet Take 1 tablet by mouth at bedtime. 02/10/16  Yes Historical Provider, MD  methadone (DOLOPHINE) 5 MG tablet Take 1 tablet by mouth 3 (three) times daily. 09/02/16   Historical Provider, MD  metoprolol tartrate (LOPRESSOR) 25 MG tablet Take 1 tablet by mouth 2 (two) times daily. 07/20/16   Historical Provider, MD   Allergies  Allergen Reactions  . Acetaminophen-Codeine     Other reaction(s): Headache  . Rofecoxib Other (See Comments)  ulcers  . Tetracyclines & Related    REVIEW OF SYSTEMS:   Could not be obtained due to critical illness.   VITAL SIGNS: Temp:  [99.9 F (37.7 C)-105.7 F (40.9 C)] 100.6  F (38.1 C) (12/16 0808) Pulse Rate:  [50-132] 94 (12/16 0808) Resp:  [16-39] 22 (12/16 0808) BP: (104-196)/(48-100) 112/62 (12/16 0800) SpO2:  [83 %-100 %] 97 % (12/16 0808) FiO2 (%):  [35 %-100 %] 35 % (12/16 0747) Weight:  [127 kg (279 lb 15.8 oz)] 127 kg (279 lb 15.8 oz) (12/16 0500) HEMODYNAMICS:   VENTILATOR SETTINGS: Vent Mode: PRVC FiO2 (%):  [35 %-100 %] 35 % Set Rate:  [24 bmp] 24 bmp Vt Set:  [500 mL] 500 mL PEEP:  [10 cmH20] 10 cmH20 Plateau Pressure:  [27 cmH20] 27 cmH20 INTAKE / OUTPUT:  Intake/Output Summary (Last 24 hours) at 10/03/16 0820 Last data filed at 10/03/16 0737  Gross per 24 hour  Intake          3583.36 ml  Output             1700 ml  Net          1883.36 ml    Physical Examination:   VS: BP 112/62   Pulse 94   Temp (!) 100.6 F (38.1 C)   Resp (!) 22   Ht '5\' 11"'$  (1.803 m)   Wt 127 kg (279 lb 15.8 oz)   SpO2 97%   BMI 39.05 kg/m   General Appearance: No distress  Neuro:without focal findings, mental status reduced.  HEENT: PERRLA, EOM intact, no ptosis. Pulmonary: normal breath sounds. CardiovascularNormal S1,S2.  No m/r/g.    Abdomen: Benign, Soft, non-tender. Renal:  No costovertebral tenderness  GU:  Not performed at this time. Endoc: No evident thyromegaly, no signs of acromegaly. Skin:   warm, no rashes, no ecchymosis  Extremities: normal, no cyanosis, clubbing, no edema, warm with normal capillary refill.    LABS: Reviewed   LABORATORY PANEL:   CBC  Recent Labs Lab 10/03/16 0433  WBC 7.5  HGB 12.4*  HCT 36.5*  PLT 113*    Chemistries   Recent Labs Lab 10/02/16 1826 10/03/16 0433  NA 131* 137  K 3.2* 3.5  CL 100* 104  CO2 20* 24  GLUCOSE 221* 181*  BUN 13 13  CREATININE 0.78 0.78  CALCIUM 7.5* 7.7*  MG 1.7 2.2  AST 122*  --   ALT 59  --   ALKPHOS 66  --   BILITOT 0.7  --      Recent Labs Lab 10/02/16 1438 10/02/16 1730 10/02/16 1947 10/02/16 2358 10/03/16 0409 10/03/16 0717  GLUCAP 212*  172* 213* 191* 162* 171*    Recent Labs Lab 10/02/16 1026 10/02/16 1800  PHART 7.23* 7.28*  PCO2ART 51* 50*  PO2ART 62* 96    Recent Labs Lab 10/02/16 0816 10/02/16 1826  AST 97* 122*  ALT 44 59  ALKPHOS 76 66  BILITOT 0.7 0.7  ALBUMIN 3.5 3.2*    Cardiac Enzymes  Recent Labs Lab 10/02/16 0816  TROPONINI 0.06*    RADIOLOGY:  Ct Head Wo Contrast  Result Date: 10/02/2016 CLINICAL DATA:  Shortness of Breath, fall, altered mental status EXAM: CT HEAD WITHOUT CONTRAST TECHNIQUE: Contiguous axial images were obtained from the base of the skull through the vertex without intravenous contrast. COMPARISON:  06/18/2015 FINDINGS: Brain: No intracranial hemorrhage, mass effect or midline shift. Stable atrophy and chronic white matter disease. No definite acute cortical  infarction. No mass lesion is noted on this unenhanced scan. Vascular: Mild atherosclerotic calcifications of carotid siphon. Skull: No skull fracture is noted. Sinuses/Orbits: There is mucosal thickening with small air-fluid level right maxillary sinus. Mucosal thickening noted bilateral ethmoid air cells. Partially visualized NG tube. Other: None IMPRESSION: No acute intracranial abnormality. No definite acute cortical infarction. Stable atrophy and chronic white matter disease. Paranasal sinuses disease as described above. Electronically Signed   By: Lahoma Crocker M.D.   On: 10/02/2016 12:30   Ct Angio Chest Pe W And/or Wo Contrast  Result Date: 10/02/2016 CLINICAL DATA:  Increased shortness of breath and respiratory distress. Altered mental status. Urinary incontinence. EXAM: CT ANGIOGRAPHY CHEST WITH CONTRAST TECHNIQUE: Multidetector CT imaging of the chest was performed using the standard protocol during bolus administration of intravenous contrast. Multiplanar CT image reconstructions and MIPs were obtained to evaluate the vascular anatomy. CONTRAST:  75 cc Isovue 370. COMPARISON:  None. FINDINGS: Cardiovascular: Image  quality is rather degraded by respiratory motion making assessment of the segmental and subsegmental pulmonary arteries difficult. No central or lobar pulmonary embolus. Atherosclerotic calcification of the arterial vasculature, including coronary arteries. Heart is mildly enlarged. No pericardial effusion. Mediastinum/Nodes: No pathologically enlarged mediastinal, hilar or axillary lymph nodes. Nasogastric tube is seen in the esophagus, extending into the stomach. Lungs/Pleura: Image quality is degraded by respiratory motion. Endotracheal tube terminates approximately 1 cm above the carina. There is bilateral lower lobe collapse/consolidation. No pleural fluid. Airway is otherwise unremarkable. Upper Abdomen: There is a great deal of artifact in the posterior upper abdomen due to the patient's arms. Visualized portions of the liver, adrenal glands, kidneys, spleen, pancreas, stomach and bowel are grossly unremarkable. Nasogastric terminates in the stomach. Musculoskeletal: No worrisome lytic or sclerotic lesions. Degenerative changes are seen in the spine. Review of the MIP images confirms the above findings. IMPRESSION: 1. Image quality is degraded by respiratory motion, limiting evaluation for segmental and subsegmental pulmonary emboli. No central or lobar pulmonary embolus. 2. Endotracheal tube terminates approximately 1 cm above the carina. Consider retracting approximately 2-3 cm for better positioning, as clinically indicated. 3. Bilateral lower lobe dependent collapse/consolidation, the majority of which likely represents atelectasis. Difficult to exclude aspiration. 4. Aortic atherosclerosis (ICD10-170.0). Coronary artery calcification. Electronically Signed   By: Lorin Picket M.D.   On: 10/02/2016 12:32   Dg Chest Port 1 View  Result Date: 10/03/2016 CLINICAL DATA:  Shortness of Breath EXAM: PORTABLE CHEST 1 VIEW COMPARISON:  10/02/2016 FINDINGS: Endotracheal tube and NG tube remain in place,  unchanged. Prior median sternotomy. Heart is borderline in size. Left lower lobe airspace opacity mildly increased since prior study. No confluent opacity on the right. No visible effusion. IMPRESSION: Increasing left lower lobe opacity concerning for pneumonia. Electronically Signed   By: Rolm Baptise M.D.   On: 10/03/2016 07:20   Dg Chest Portable 1 View  Result Date: 10/02/2016 CLINICAL DATA:  Status post intubation and nasogastric tube placement. Sepsis. EXAM: PORTABLE CHEST 1 VIEW COMPARISON:  Radiograph of same day. FINDINGS: Stable cardiomediastinal silhouette. Endotracheal tube is seen projected over tracheal air shadow with distal tip 2 cm above the carina. Nasogastric tube tip is seen entering stomach. No pneumothorax or significant pleural effusion is noted. Mild diffuse interstitial densities are noted concerning for pulmonary edema. Atherosclerosis of thoracic aorta is noted. Bony thorax is unremarkable. IMPRESSION: Endotracheal and nasogastric tubes in grossly good position. Stable probable bilateral pulmonary edema. Electronically Signed   By: Marijo Conception, M.D.  On: 10/02/2016 10:53   Dg Chest Port 1 View  Result Date: 10/02/2016 CLINICAL DATA:  Altered mental status EXAM: PORTABLE CHEST 1 VIEW COMPARISON:  Mar 07, 2008 FINDINGS: There is mild interstitial edema. There is no airspace consolidation. Heart is mildly enlarged with mild pulmonary venous hypertension. Patient is status post median sternotomy. There is atherosclerotic calcification in the aorta. There is postoperative change in lower cervical spine region. IMPRESSION: Findings indicative of a degree of congestive heart failure. No airspace consolidation. Aortic atherosclerosis. Electronically Signed   By: Lowella Grip III M.D.   On: 10/02/2016 08:32   Dg Cyndy Freeze Guided Loc Of Needle/cath Tip For Spinal Inject Lt  Result Date: 10/02/2016 David A Martinique, MD     10/02/2016  5:25 PM Fluoro guded lumbar puncture at L3-$  level with acquisition of 3 cc clear colorless spinal fluid. See dictated Radiology report.      --Marda Stalker, MD.  Board Certified in Internal Medicine, Pulmonary Medicine, [place], and Sleep Medicine.  ICU Pager [phone]  Pulmonary and Critical Care Office Number: [phone]  Patricia Pesa, M.D.  Vilinda Boehringer, M.D.  Merton Border, M.D   10/03/2016, 8:20 AM   [place].  I have personally obtained a history, examined the patient, evaluated laboratory and imaging results, formulated the assessment and plan and placed orders. The Patient requires high complexity decision making for assessment and support, frequent evaluation and titration of therapies, application of advanced monitoring technologies and extensive interpretation of multiple databases. The patient has critical illness that could lead imminently to failure of 1 or more organ systems and requires the highest level of physician preparedness to intervene.  Critical Care Time devoted to patient care services described in this note is 45 minutes and is exclusive of time spent in procedures.

## 2016-10-03 NOTE — Consult Note (Signed)
Reason for Consult:AMS and fever Referring Physician: Ashby Dawes  CC: AMS and fever  HPI: [name] is an 70 y.o. male who is intubated and sedated.  Patient unable to provide history and family not present at this time.  All history obtained from the chart.  The patient fell off a desk two days ago.  Since that time he has had progressive weakness, ataxia and fatigue.  His speech was normal.  This morning he was confused with altered speech.  There was a question that the patient was post-ictal but no  evidence of seizure activity.  Patient was brought in for evaluation. Further decompensated in the ED and required intubation.  Patient was febrile in the ED.      On antibiotics for suspected Meningitis.    Past Medical History:  Diagnosis Date  . Chronic pain   . Coronary artery disease   . Diabetes mellitus without complication ([place])   . DJD (degenerative joint disease)   . GERD (gastroesophageal reflux disease)   . Hyperlipemia   . Hypertension   . Insomnia   . Neuropathy [facility])     Past Surgical History:  Procedure Laterality Date  . BACK SURGERY    . CARDIAC SURGERY    . JOINT REPLACEMENT      Family history: Mother died of old age.  Father with CAD died of an MI.  Brother with COPD and pulmonary fibrosis.  Social History:  reports that he has never smoked. He has never used smokeless tobacco. He reports that he does not drink alcohol. His drug history is not on file.  Allergies  Allergen Reactions  . Acetaminophen-Codeine     Other reaction(s): Headache  . Rofecoxib Other (See Comments)    ulcers  . Tetracyclines & Related     Medications:  I have reviewed the patient's current medications. Prior to Admission:  Prescriptions Prior to Admission  Medication Sig Dispense Refill Last Dose  . aspirin EC 81 MG tablet Take 1 tablet by mouth daily.   Unknown at Unknown  . Cyanocobalamin (RA VITAMIN B-12 TR) 1000 MCG TBCR Take 1 tablet by mouth daily.   Unknown at  Unknown  . cyclobenzaprine (FLEXERIL) 10 MG tablet Take 1 tablet by mouth daily.   Unknown at Unknown  . DULoxetine (CYMBALTA) 60 MG capsule Take 1 capsule by mouth at bedtime.   Unknown at Unknown  . furosemide (LASIX) 40 MG tablet Take 1 tablet by mouth 2 (two) times daily.   Unknown at Unknown  . gabapentin (NEURONTIN) 600 MG tablet Take 1 tablet by mouth 3 (three) times daily.   Unknown at Unknown  . lisinopril (PRINIVIL,ZESTRIL) 10 MG tablet Take 1 tablet by mouth daily.   Unknown at Unknown  . Loratadine 10 MG CAPS Take 1 capsule by mouth daily.   Unknown at Unknown  . meclizine (ANTIVERT) 25 MG tablet Take 1 tablet by mouth 3 (three) times daily.   Unknown at Unknown  . metFORMIN (GLUCOPHAGE) 1000 MG tablet Take 1 tablet by mouth 2 (two) times daily. With meals.   Unknown at Unknown  . Multiple Vitamin (MULTI-VITAMINS) TABS Take 1 tablet by mouth daily.   Unknown at Unknown  . omeprazole (PRILOSEC) 20 MG capsule Take 20 mg by mouth daily.   Unknown at Unknown  . Oxcarbazepine (TRILEPTAL) 300 MG tablet Take 1 tablet by mouth 2 (two) times daily.   Unknown at Unknown  . pravastatin (PRAVACHOL) 40 MG tablet Take 1 tablet by mouth  at bedtime.    at Unknown  . methadone (DOLOPHINE) 5 MG tablet Take 1 tablet by mouth 3 (three) times daily.   Unknown at Unknown  . metoprolol tartrate (LOPRESSOR) 25 MG tablet Take 1 tablet by mouth 2 (two) times daily.   Unknown at Unknown   Scheduled: . acyclovir  950 mg Intravenous Q8H  .  ceFAZolin (ANCEF) IV  2 g Intravenous Q8H  . chlorhexidine gluconate (MEDLINE KIT)  15 mL Mouth Rinse BID  . enoxaparin (LOVENOX) injection  40 mg Subcutaneous Q24H  . famotidine (PEPCID) IV  20 mg Intravenous Q12H  . fentaNYL (SUBLIMAZE) injection  50 mcg Intravenous Once  . insulin aspart  2-6 Units Subcutaneous Q4H  . ipratropium-albuterol  3 mL Nebulization Q6H  . mouth rinse  15 mL Mouth Rinse Q2H  . senna-docusate  1 tablet Oral BID    ROS: Unable to  provide  Physical Examination: Blood pressure 129/77, pulse (!) 46, temperature 100.2 F (37.9 C), temperature source Core (Comment), resp. rate 20, height '5\' 11"'  (1.803 m), weight 127 kg (279 lb 15.8 oz), SpO2 96 %.  HEENT-  Normocephalic, no lesions, without obvious abnormality.  Normal external eye and conjunctiva.  Normal TM's bilaterally.  Normal auditory canals and external ears. Normal external nose, mucus membranes and septum.  Normal pharynx. Cardiovascular- S1, S2 normal, pulses palpable throughout   Lungs- chest clear, no wheezing, rales, normal symmetric air entry, Heart exam - S1, S2 normal, no murmur, no gallop, rate regular Abdomen- soft, non-tender; bowel sounds normal; no masses,  no organomegaly Extremities- no edema Lymph-no adenopathy palpable Musculoskeletal-no joint tenderness, deformity or swelling Skin-warm and dry, no hyperpigmentation, vitiligo, or suspicious lesions  Neurological Examination Mental Status: Patient does not respond to verbal stimuli.  Does not respond to deep sternal rub.  Does not follow commands.  No verbalizations noted.  Cranial Nerves: II: patient does not respond confrontation bilaterally, pupils right 2 mm, left 2 mm,and unreactive bilaterally III,IV,VI: doll's response absent bilaterally.  V,VII: corneal reflex present bilaterally  VIII: patient does not respond to verbal stimuli IX,X: gag reflex reduced, XI: trapezius strength unable to test bilaterally XII: tongue strength unable to test Motor: Extremities flaccid throughout.  No spontaneous movement noted.  No purposeful movements noted. Sensory: Does not respond to noxious stimuli in any extremity. Deep Tendon Reflexes:  1+ throughout. Plantars: Mute bilaterally Cerebellar: Unable to perform   Laboratory Studies:   Basic Metabolic Panel:  Recent Labs Lab 10/02/16 0816 10/02/16 1826 10/03/16 0433  NA 133* 131* 137  K 3.1* 3.2* 3.5  CL 99* 100* 104  CO2 22 20* 24   GLUCOSE 195* 221* 181*  BUN '14 13 13  ' CREATININE 0.85 0.78 0.78  CALCIUM 8.4* 7.5* 7.7*  MG  --  1.7 2.2    Liver Function Tests:  Recent Labs Lab 10/02/16 0816 10/02/16 1826  AST 97* 122*  ALT 44 59  ALKPHOS 76 66  BILITOT 0.7 0.7  PROT 7.0 6.9  ALBUMIN 3.5 3.2*   No results for input(s): LIPASE, AMYLASE in the last 168 hours. No results for input(s): AMMONIA in the last 168 hours.  CBC:  Recent Labs Lab 10/02/16 0816 10/02/16 1826 10/03/16 0433  WBC 14.2* 12.2* 7.5  NEUTROABS 13.3* 11.1*  --   HGB 12.5* 13.5 12.4*  HCT 36.8* 40.9 36.5*  MCV 85.4 87.2 85.0  PLT 145* 117* 113*    Cardiac Enzymes:  Recent Labs Lab 10/02/16 0816  TROPONINI 0.06*  BNP: Invalid input(s): POCBNP  CBG:  Recent Labs Lab 10/02/16 2358 10/03/16 0409 10/03/16 0717 10/03/16 1208 10/03/16 1536  GLUCAP 191* 162* 171* 183* 62*    Microbiology: Results for orders placed or performed during the hospital encounter of 10/02/16  Blood Culture (routine x 2)     Status: None (Preliminary result)   Collection Time: 10/02/16  8:16 AM  Result Value Ref Range Status   Specimen Description BLOOD RIGHT ARM  Final   Special Requests   Final    BOTTLES DRAWN AEROBIC AND ANAEROBIC  AER 8CC ANA 10CC   Culture  Setup Time   Final    GRAM POSITIVE COCCI IN BOTH AEROBIC AND ANAEROBIC BOTTLES CRITICAL VALUE NOTED.  VALUE IS CONSISTENT WITH PREVIOUSLY REPORTED AND CALLED VALUE.    Culture GRAM POSITIVE COCCI  Final   Report Status PENDING  Incomplete  Blood Culture (routine x 2)     Status: Abnormal (Preliminary result)   Collection Time: 10/02/16  8:16 AM  Result Value Ref Range Status   Specimen Description BLOOD LEFT ANTECUBITAL  Final   Special Requests   Final    BOTTLES DRAWN AEROBIC AND ANAEROBIC  AER 11CC ANA 12CC   Culture  Setup Time   Final    GRAM POSITIVE COCCI IN BOTH AEROBIC AND ANAEROBIC BOTTLES CRITICAL RESULT CALLED TO, READ BACK BY AND VERIFIED WITH: Carmin Richmond @ [id number] 10/02/16 by [facility] Performed at [place] (A)  Final   Report Status PENDING  Incomplete  Urine culture     Status: None   Collection Time: 10/02/16  8:16 AM  Result Value Ref Range Status   Specimen Description URINE, RANDOM  Final   Special Requests NONE  Final   Culture NO GROWTH Performed at [facility]   Final   Report Status 10/03/2016 FINAL  Final  Blood Culture ID Panel (Reflexed)     Status: Abnormal   Collection Time: 10/02/16  8:16 AM  Result Value Ref Range Status   Enterococcus species NOT DETECTED NOT DETECTED Final   Listeria monocytogenes NOT DETECTED NOT DETECTED Final   Staphylococcus species DETECTED (A) NOT DETECTED Final    Comment: CRITICAL RESULT CALLED TO, READ BACK BY AND VERIFIED WITH: Carmin Richmond @ 0177 10/02/16 by [place]    Staphylococcus aureus DETECTED (A) NOT DETECTED Final    Comment: CRITICAL RESULT CALLED TO, READ BACK BY AND VERIFIED WITH: Carmin Richmond @ [phone] 10/02/16 by [facility]    Methicillin resistance NOT DETECTED NOT DETECTED Final   Streptococcus species NOT DETECTED NOT DETECTED Final   Streptococcus agalactiae NOT DETECTED NOT DETECTED Final   Streptococcus pneumoniae NOT DETECTED NOT DETECTED Final   Streptococcus pyogenes NOT DETECTED NOT DETECTED Final   Acinetobacter baumannii NOT DETECTED NOT DETECTED Final   Enterobacteriaceae species NOT DETECTED NOT DETECTED Final   Enterobacter cloacae complex NOT DETECTED NOT DETECTED Final   Escherichia coli NOT DETECTED NOT DETECTED Final   Klebsiella oxytoca NOT DETECTED NOT DETECTED Final   Klebsiella pneumoniae NOT DETECTED NOT DETECTED Final   Proteus species NOT DETECTED NOT DETECTED Final   Serratia marcescens NOT DETECTED NOT DETECTED Final   Haemophilus influenzae NOT DETECTED NOT DETECTED Final   Neisseria meningitidis NOT DETECTED NOT DETECTED Final   Pseudomonas aeruginosa NOT DETECTED NOT DETECTED Final   Candida  albicans NOT DETECTED NOT DETECTED Final   Candida glabrata NOT DETECTED NOT DETECTED Final   Candida krusei  NOT DETECTED NOT DETECTED Final   Candida parapsilosis NOT DETECTED NOT DETECTED Final   Candida tropicalis NOT DETECTED NOT DETECTED Final  MRSA PCR Screening     Status: None   Collection Time: 10/02/16  2:40 PM  Result Value Ref Range Status   MRSA by PCR NEGATIVE NEGATIVE Final    Comment:        The GeneXpert MRSA Assay (FDA approved for NASAL specimens only), is one component of a comprehensive MRSA colonization surveillance program. It is not intended to diagnose MRSA infection nor to guide or monitor treatment for MRSA infections.   CSF culture     Status: None (Preliminary result)   Collection Time: 10/02/16  3:13 PM  Result Value Ref Range Status   Specimen Description CSF  Final   Special Requests Normal  Final   Gram Stain   Final    RARE WBC SEEN RARE RED BLOOD CELLS NO ORGANISMS SEEN    Culture   Final    NO GROWTH 1 DAY Performed at [facility]    Report Status PENDING  Incomplete  Culture, fungus without smear     Status: None (Preliminary result)   Collection Time: 10/02/16  3:14 PM  Result Value Ref Range Status   Specimen Description CSF  Final   Special Requests Normal  Final   Culture   Final    NO FUNGUS ISOLATED AFTER 1 DAY Performed at [facility]    Report Status PENDING  Incomplete  Culture, expectorated sputum-assessment     Status: None   Collection Time: 10/02/16  6:18 PM  Result Value Ref Range Status   Specimen Description TRACHEAL ASPIRATE  Final   Special Requests NONE  Final   Sputum evaluation THIS SPECIMEN IS ACCEPTABLE FOR SPUTUM CULTURE  Final   Report Status 10/02/2016 FINAL  Final  Culture, respiratory (NON-Expectorated)     Status: None (Preliminary result)   Collection Time: 10/02/16  6:18 PM  Result Value Ref Range Status   Specimen Description TRACHEAL ASPIRATE  Final   Special Requests NONE  Reflexed from [id number]  Final   Gram Stain   Final    ABUNDANT WBC PRESENT, PREDOMINANTLY MONONUCLEAR FEW GRAM NEGATIVE COCCI IN PAIRS RARE GRAM POSITIVE RODS Performed at [facility]    Culture PENDING  Incomplete   Report Status PENDING  Incomplete    Coagulation Studies:  Recent Labs  10/02/16 0816  LABPROT 15.5*  INR 1.22    Urinalysis:   Recent Labs Lab 10/02/16 0816  COLORURINE YELLOW*  LABSPEC 1.019  PHURINE 5.0  GLUCOSEU NEGATIVE  HGBUR NEGATIVE  BILIRUBINUR NEGATIVE  KETONESUR 20*  PROTEINUR NEGATIVE  NITRITE NEGATIVE  LEUKOCYTESUR NEGATIVE    Lipid Panel:  No results found for: CHOL, TRIG, HDL, CHOLHDL, VLDL, LDLCALC  HgbA1C: No results found for: HGBA1C  Urine Drug Screen:  No results found for: LABOPIA, COCAINSCRNUR, LABBENZ, AMPHETMU, THCU, LABBARB  Alcohol Level: No results for input(s): ETH in the last 168 hours.  Other results: EKG: Paced, 105 bpm.  Imaging: Ct Head Wo Contrast  Result Date: 10/02/2016 CLINICAL DATA:  Shortness of Breath, fall, altered mental status EXAM: CT HEAD WITHOUT CONTRAST TECHNIQUE: Contiguous axial images were obtained from the base of the skull through the vertex without intravenous contrast. COMPARISON:  06/18/2015 FINDINGS: Brain: No intracranial hemorrhage, mass effect or midline shift. Stable atrophy and chronic white matter disease. No definite acute cortical infarction. No mass lesion is noted on this unenhanced  scan. Vascular: Mild atherosclerotic calcifications of carotid siphon. Skull: No skull fracture is noted. Sinuses/Orbits: There is mucosal thickening with small air-fluid level right maxillary sinus. Mucosal thickening noted bilateral ethmoid air cells. Partially visualized NG tube. Other: None IMPRESSION: No acute intracranial abnormality. No definite acute cortical infarction. Stable atrophy and chronic white matter disease. Paranasal sinuses disease as described above. Electronically Signed   By:  Lahoma Crocker M.D.   On: 10/02/2016 12:30   Ct Angio Chest Pe W And/or Wo Contrast  Result Date: 10/02/2016 CLINICAL DATA:  Increased shortness of breath and respiratory distress. Altered mental status. Urinary incontinence. EXAM: CT ANGIOGRAPHY CHEST WITH CONTRAST TECHNIQUE: Multidetector CT imaging of the chest was performed using the standard protocol during bolus administration of intravenous contrast. Multiplanar CT image reconstructions and MIPs were obtained to evaluate the vascular anatomy. CONTRAST:  75 cc Isovue 370. COMPARISON:  None. FINDINGS: Cardiovascular: Image quality is rather degraded by respiratory motion making assessment of the segmental and subsegmental pulmonary arteries difficult. No central or lobar pulmonary embolus. Atherosclerotic calcification of the arterial vasculature, including coronary arteries. Heart is mildly enlarged. No pericardial effusion. Mediastinum/Nodes: No pathologically enlarged mediastinal, hilar or axillary lymph nodes. Nasogastric tube is seen in the esophagus, extending into the stomach. Lungs/Pleura: Image quality is degraded by respiratory motion. Endotracheal tube terminates approximately 1 cm above the carina. There is bilateral lower lobe collapse/consolidation. No pleural fluid. Airway is otherwise unremarkable. Upper Abdomen: There is a great deal of artifact in the posterior upper abdomen due to the patient's arms. Visualized portions of the liver, adrenal glands, kidneys, spleen, pancreas, stomach and bowel are grossly unremarkable. Nasogastric terminates in the stomach. Musculoskeletal: No worrisome lytic or sclerotic lesions. Degenerative changes are seen in the spine. Review of the MIP images confirms the above findings. IMPRESSION: 1. Image quality is degraded by respiratory motion, limiting evaluation for segmental and subsegmental pulmonary emboli. No central or lobar pulmonary embolus. 2. Endotracheal tube terminates approximately 1 cm above the  carina. Consider retracting approximately 2-3 cm for better positioning, as clinically indicated. 3. Bilateral lower lobe dependent collapse/consolidation, the majority of which likely represents atelectasis. Difficult to exclude aspiration. 4. Aortic atherosclerosis (ICD10-170.0). Coronary artery calcification. Electronically Signed   By: Lorin Picket M.D.   On: 10/02/2016 12:32   Dg Chest Port 1 View  Result Date: 10/03/2016 CLINICAL DATA:  Shortness of Breath EXAM: PORTABLE CHEST 1 VIEW COMPARISON:  10/02/2016 FINDINGS: Endotracheal tube and NG tube remain in place, unchanged. Prior median sternotomy. Heart is borderline in size. Left lower lobe airspace opacity mildly increased since prior study. No confluent opacity on the right. No visible effusion. IMPRESSION: Increasing left lower lobe opacity concerning for pneumonia. Electronically Signed   By: Rolm Baptise M.D.   On: 10/03/2016 07:20   Dg Chest Portable 1 View  Result Date: 10/02/2016 CLINICAL DATA:  Status post intubation and nasogastric tube placement. Sepsis. EXAM: PORTABLE CHEST 1 VIEW COMPARISON:  Radiograph of same day. FINDINGS: Stable cardiomediastinal silhouette. Endotracheal tube is seen projected over tracheal air shadow with distal tip 2 cm above the carina. Nasogastric tube tip is seen entering stomach. No pneumothorax or significant pleural effusion is noted. Mild diffuse interstitial densities are noted concerning for pulmonary edema. Atherosclerosis of thoracic aorta is noted. Bony thorax is unremarkable. IMPRESSION: Endotracheal and nasogastric tubes in grossly good position. Stable probable bilateral pulmonary edema. Electronically Signed   By: Marijo Conception, M.D.   On: 10/02/2016 10:53   Dg Chest [facility]  1 View  Result Date: 10/02/2016 CLINICAL DATA:  Altered mental status EXAM: PORTABLE CHEST 1 VIEW COMPARISON:  Mar 07, 2008 FINDINGS: There is mild interstitial edema. There is no airspace consolidation. Heart is mildly  enlarged with mild pulmonary venous hypertension. Patient is status post median sternotomy. There is atherosclerotic calcification in the aorta. There is postoperative change in lower cervical spine region. IMPRESSION: Findings indicative of a degree of congestive heart failure. No airspace consolidation. Aortic atherosclerosis. Electronically Signed   By: Lowella Grip III M.D.   On: 10/02/2016 08:32   Dg Cyndy Freeze Guided Loc Of Needle/cath Tip For Spinal Inject Lt  Result Date: 10/02/2016 David A Martinique, MD     10/02/2016  5:25 PM Fluoro guded lumbar puncture at L3-$ level with acquisition of 3 cc clear colorless spinal fluid. See dictated Radiology report.    Assessment/Plan: 70 year old male with progressive decline over the past 28 hours now presenting with altered mental status and fever.  No clear source of fever found.     LP under fluoro if possible but pt is already on broad spectrum antibiotics.   ID team following  Call with questions.   Troponin 0.06 likely demand ischemia, please trend.

## 2016-10-03 NOTE — Progress Notes (Signed)
*  PRELIMINARY RESULTS* Echocardiogram 2D Echocardiogram has been performed.  [name] 10/03/2016, 12:27 PM

## 2016-10-03 NOTE — Progress Notes (Addendum)
ANTIBIOTIC CONSULT NOTE - INITIAL  Pharmacy Consult for Cefazolin, Acyclovir Indication: bacteremia, suspected meningitis  Allergies  Allergen Reactions  . Acetaminophen-Codeine     Other reaction(s): Headache  . Rofecoxib Other (See Comments)    ulcers  . Tetracyclines & Related     Patient Measurements: Height: '5\' 11"'$  (180.3 cm) Weight: 279 lb 15.8 oz (127 kg) IBW/kg (Calculated) : 75.3 Adjusted Body Weight: 94.2 kg   Vital Signs: Temp: 100.6 F (38.1 C) (12/16 0838) Temp Source: Core (Comment) (12/16 0800) BP: 112/62 (12/16 0800) Pulse Rate: 94 (12/16 0838) Intake/Output from previous day: 12/15 0701 - 12/16 0700 In: 3367.9 [I.V.:2102.9; IV Piggyback:1265] Out: 1700 [Urine:1700] Intake/Output from this shift: Total I/O In: 225 [I.V.:125; IV Piggyback:100] Out: 75 [Urine:75]  Labs:  Recent Labs  10/02/16 0816 10/02/16 1826 10/03/16 0433  WBC 14.2* 12.2* 7.5  HGB 12.5* 13.5 12.4*  PLT 145* 117* 113*  CREATININE 0.85 0.78 0.78   Estimated Creatinine Clearance: 116.7 mL/min (by C-G formula based on SCr of 0.78 mg/dL). No results for input(s): VANCOTROUGH, VANCOPEAK, VANCORANDOM, GENTTROUGH, GENTPEAK, GENTRANDOM, TOBRATROUGH, TOBRAPEAK, TOBRARND, AMIKACINPEAK, AMIKACINTROU, AMIKACIN in the last 72 hours.   Microbiology: Recent Results (from the past 720 hour(s))  Blood Culture (routine x 2)     Status: None (Preliminary result)   Collection Time: 10/02/16  8:16 AM  Result Value Ref Range Status   Specimen Description BLOOD RIGHT ARM  Final   Special Requests   Final    BOTTLES DRAWN AEROBIC AND ANAEROBIC  AER 8CC ANA 10CC   Culture  Setup Time   Final    GRAM POSITIVE COCCI IN BOTH AEROBIC AND ANAEROBIC BOTTLES CRITICAL VALUE NOTED.  VALUE IS CONSISTENT WITH PREVIOUSLY REPORTED AND CALLED VALUE.    Culture GRAM POSITIVE COCCI  Final   Report Status PENDING  Incomplete  Blood Culture (routine x 2)     Status: None (Preliminary result)   Collection Time:  10/02/16  8:16 AM  Result Value Ref Range Status   Specimen Description BLOOD LEFT ANTECUBITAL  Final   Special Requests   Final    BOTTLES DRAWN AEROBIC AND ANAEROBIC  AER 11CC ANA 12CC   Culture  Setup Time   Final    GRAM POSITIVE COCCI IN BOTH AEROBIC AND ANAEROBIC BOTTLES CRITICAL RESULT CALLED TO, READ BACK BY AND VERIFIED WITH: Carmin Richmond @ 7322 10/02/16 by [facility] Performed at [facility]  Final   Report Status PENDING  Incomplete  Blood Culture ID Panel (Reflexed)     Status: Abnormal   Collection Time: 10/02/16  8:16 AM  Result Value Ref Range Status   Enterococcus species NOT DETECTED NOT DETECTED Final   Listeria monocytogenes NOT DETECTED NOT DETECTED Final   Staphylococcus species DETECTED (A) NOT DETECTED Final    Comment: CRITICAL RESULT CALLED TO, READ BACK BY AND VERIFIED WITH: Carmin Richmond @ 0254 10/02/16 by [place]    Staphylococcus aureus DETECTED (A) NOT DETECTED Final    Comment: CRITICAL RESULT CALLED TO, READ BACK BY AND VERIFIED WITH: Carmin Richmond @ 2706 10/02/16 by [facility]    Methicillin resistance NOT DETECTED NOT DETECTED Final   Streptococcus species NOT DETECTED NOT DETECTED Final   Streptococcus agalactiae NOT DETECTED NOT DETECTED Final   Streptococcus pneumoniae NOT DETECTED NOT DETECTED Final   Streptococcus pyogenes NOT DETECTED NOT DETECTED Final   Acinetobacter baumannii NOT DETECTED NOT DETECTED Final   Enterobacteriaceae species NOT DETECTED NOT DETECTED  Final   Enterobacter cloacae complex NOT DETECTED NOT DETECTED Final   Escherichia coli NOT DETECTED NOT DETECTED Final   Klebsiella oxytoca NOT DETECTED NOT DETECTED Final   Klebsiella pneumoniae NOT DETECTED NOT DETECTED Final   Proteus species NOT DETECTED NOT DETECTED Final   Serratia marcescens NOT DETECTED NOT DETECTED Final   Haemophilus influenzae NOT DETECTED NOT DETECTED Final   Neisseria meningitidis NOT DETECTED NOT DETECTED Final    Pseudomonas aeruginosa NOT DETECTED NOT DETECTED Final   Candida albicans NOT DETECTED NOT DETECTED Final   Candida glabrata NOT DETECTED NOT DETECTED Final   Candida krusei NOT DETECTED NOT DETECTED Final   Candida parapsilosis NOT DETECTED NOT DETECTED Final   Candida tropicalis NOT DETECTED NOT DETECTED Final  MRSA PCR Screening     Status: None   Collection Time: 10/02/16  2:40 PM  Result Value Ref Range Status   MRSA by PCR NEGATIVE NEGATIVE Final    Comment:        The GeneXpert MRSA Assay (FDA approved for NASAL specimens only), is one component of a comprehensive MRSA colonization surveillance program. It is not intended to diagnose MRSA infection nor to guide or monitor treatment for MRSA infections.   CSF culture     Status: None (Preliminary result)   Collection Time: 10/02/16  3:13 PM  Result Value Ref Range Status   Specimen Description CSF  Final   Special Requests Normal  Final   Gram Stain   Final    RARE WBC SEEN RARE RED BLOOD CELLS NO ORGANISMS SEEN    Culture PENDING  Incomplete   Report Status PENDING  Incomplete  Culture, expectorated sputum-assessment     Status: None   Collection Time: 10/02/16  6:18 PM  Result Value Ref Range Status   Specimen Description TRACHEAL ASPIRATE  Final   Special Requests NONE  Final   Sputum evaluation THIS SPECIMEN IS ACCEPTABLE FOR SPUTUM CULTURE  Final   Report Status 10/02/2016 FINAL  Final  Culture, respiratory (NON-Expectorated)     Status: None (Preliminary result)   Collection Time: 10/02/16  6:18 PM  Result Value Ref Range Status   Specimen Description TRACHEAL ASPIRATE  Final   Special Requests NONE Reflexed from [id number]  Final   Gram Stain   Final    ABUNDANT WBC PRESENT, PREDOMINANTLY MONONUCLEAR FEW GRAM NEGATIVE COCCI IN PAIRS RARE GRAM POSITIVE RODS Performed at [facility]    Culture PENDING  Incomplete   Report Status PENDING  Incomplete    Medical History: Past Medical History:   Diagnosis Date  . Chronic pain   . Coronary artery disease   . Diabetes mellitus without complication ([place])   . DJD (degenerative joint disease)   . GERD (gastroesophageal reflux disease)   . Hyperlipemia   . Hypertension   . Insomnia   . Neuropathy ([place])     Medications:  Prescriptions Prior to Admission  Medication Sig Dispense Refill Last Dose  . aspirin EC 81 MG tablet Take 1 tablet by mouth daily.   Unknown at Unknown  . Cyanocobalamin (RA VITAMIN B-12 TR) 1000 MCG TBCR Take 1 tablet by mouth daily.   Unknown at Unknown  . cyclobenzaprine (FLEXERIL) 10 MG tablet Take 1 tablet by mouth daily.   Unknown at Unknown  . DULoxetine (CYMBALTA) 60 MG capsule Take 1 capsule by mouth at bedtime.   Unknown at Unknown  . furosemide (LASIX) 40 MG tablet Take 1 tablet by mouth 2 (two) times  daily.   Unknown at Unknown  . gabapentin (NEURONTIN) 600 MG tablet Take 1 tablet by mouth 3 (three) times daily.   Unknown at Unknown  . lisinopril (PRINIVIL,ZESTRIL) 10 MG tablet Take 1 tablet by mouth daily.   Unknown at Unknown  . Loratadine 10 MG CAPS Take 1 capsule by mouth daily.   Unknown at Unknown  . meclizine (ANTIVERT) 25 MG tablet Take 1 tablet by mouth 3 (three) times daily.   Unknown at Unknown  . metFORMIN (GLUCOPHAGE) 1000 MG tablet Take 1 tablet by mouth 2 (two) times daily. With meals.   Unknown at Unknown  . Multiple Vitamin (MULTI-VITAMINS) TABS Take 1 tablet by mouth daily.   Unknown at Unknown  . omeprazole (PRILOSEC) 20 MG capsule Take 20 mg by mouth daily.   Unknown at Unknown  . Oxcarbazepine (TRILEPTAL) 300 MG tablet Take 1 tablet by mouth 2 (two) times daily.   Unknown at Unknown  . pravastatin (PRAVACHOL) 40 MG tablet Take 1 tablet by mouth at bedtime.    at Unknown  . methadone (DOLOPHINE) 5 MG tablet Take 1 tablet by mouth 3 (three) times daily.   Unknown at Unknown  . metoprolol tartrate (LOPRESSOR) 25 MG tablet Take 1 tablet by mouth 2 (two) times daily.   Unknown at Unknown    Assessment: TBW = 122.5 kg ,  CrCl = 114.5 ml/min   Goal of Therapy:  resolution of infection  Plan:  Expected duration 7 days with resolution of temperature and/or normalization of WBC   Will continue Cefazolin 2 gm IV Q8H  Will continue  Acyclovir 950  mg IV Q8H.   Ramonica Grigg D 10/03/2016,9:43 AM

## 2016-10-03 NOTE — Progress Notes (Signed)
[facility] Progress Note Patient Name: [name] DOB: [birth year] MRN: [id number]   Date of Service  10/03/2016  HPI/Events of Note  afib rvr.  bp ok,  conjunctivitis  eICU Interventions  Given another amiodarone bolus '150mg'$  then gave prn metoprolol IV order, gave cipro eye qtts     Intervention Category Major Interventions: Arrhythmia - evaluation and management  Asencion Noble 10/03/2016, 8:22 PM

## 2016-10-04 ENCOUNTER — Inpatient Hospital Stay: Payer: PPO

## 2016-10-04 DIAGNOSIS — J189 Pneumonia, unspecified organism: Secondary | ICD-10-CM | POA: Diagnosis not present

## 2016-10-04 LAB — BLOOD GAS, ARTERIAL
ACID-BASE EXCESS: 2.2 mmol/L — AB (ref 0.0–2.0)
ACID-BASE EXCESS: 2.6 mmol/L — AB (ref 0.0–2.0)
BICARBONATE: 28.3 mmol/L — AB (ref 20.0–28.0)
Bicarbonate: 29.3 mmol/L — ABNORMAL HIGH (ref 20.0–28.0)
FIO2: 0.35
FIO2: 0.35
O2 Saturation: 99.1 %
O2 Saturation: 97 %
PCO2 ART: 53 mmHg — AB (ref 32.0–48.0)
PEEP/CPAP: 8 cmH2O
PEEP: 5 cmH2O
PH ART: 7.37 (ref 7.350–7.450)
PH ART: 7.35 (ref 7.350–7.450)
Patient temperature: 37
Patient temperature: 37
Pressure support: 5 cmH2O
RATE: 12 resp/min
VT: 500 mL
pCO2 arterial: 49 mmHg — ABNORMAL HIGH (ref 32.0–48.0)
pO2, Arterial: 138 mmHg — ABNORMAL HIGH (ref 83.0–108.0)
pO2, Arterial: 95 mmHg (ref 83.0–108.0)

## 2016-10-04 LAB — CBC
HEMATOCRIT: 34.7 % — AB (ref 40.0–52.0)
Hemoglobin: 11.7 g/dL — ABNORMAL LOW (ref 13.0–18.0)
MCH: 28.8 pg (ref 26.0–34.0)
MCHC: 33.8 g/dL (ref 32.0–36.0)
MCV: 85.2 fL (ref 80.0–100.0)
PLATELETS: 88 10*3/uL — AB (ref 150–440)
RBC: 4.07 MIL/uL — AB (ref 4.40–5.90)
RDW: 16.3 % — ABNORMAL HIGH (ref 11.5–14.5)
WBC: 4.1 10*3/uL (ref 3.8–10.6)

## 2016-10-04 LAB — GLUCOSE, CAPILLARY
GLUCOSE-CAPILLARY: 140 mg/dL — AB (ref 65–99)
GLUCOSE-CAPILLARY: 144 mg/dL — AB (ref 65–99)
GLUCOSE-CAPILLARY: 149 mg/dL — AB (ref 65–99)
GLUCOSE-CAPILLARY: 150 mg/dL — AB (ref 65–99)
GLUCOSE-CAPILLARY: 193 mg/dL — AB (ref 65–99)
Glucose-Capillary: 174 mg/dL — ABNORMAL HIGH (ref 65–99)

## 2016-10-04 LAB — CULTURE, BLOOD (ROUTINE X 2)

## 2016-10-04 LAB — BASIC METABOLIC PANEL
ANION GAP: 5 (ref 5–15)
BUN: 13 mg/dL (ref 6–20)
CO2: 27 mmol/L (ref 22–32)
Calcium: 8 mg/dL — ABNORMAL LOW (ref 8.9–10.3)
Chloride: 105 mmol/L (ref 101–111)
Creatinine, Ser: 0.84 mg/dL (ref 0.61–1.24)
GFR calc Af Amer: >60 mL/min (ref >60)
GLUCOSE: 162 mg/dL — AB (ref 65–99)
POTASSIUM: 3.7 mmol/L (ref 3.5–5.1)
Sodium: 137 mmol/L (ref 135–145)

## 2016-10-04 LAB — PROCALCITONIN: Procalcitonin: 4.36 ng/mL

## 2016-10-04 LAB — ECHOCARDIOGRAM COMPLETE
Height: 71 in
WEIGHTICAEL: 4479.75 [oz_av]

## 2016-10-04 LAB — VANCOMYCIN, TROUGH

## 2016-10-04 LAB — MAGNESIUM: Magnesium: 2.2 mg/dL (ref 1.7–2.4)

## 2016-10-04 LAB — PHOSPHORUS: Phosphorus: 2.1 mg/dL — ABNORMAL LOW (ref 2.5–4.6)

## 2016-10-04 MED ORDER — METHADONE HCL 10 MG PO TABS
5.0000 mg | ORAL_TABLET | Freq: Three times a day (TID) | ORAL | Status: DC
  Administered 2016-10-04 – 2016-10-07 (×11): 5 mg via ORAL
  Filled 2016-10-04 (×11): qty 1

## 2016-10-04 MED ORDER — IPRATROPIUM-ALBUTEROL 0.5-2.5 (3) MG/3ML IN SOLN
3.0000 mL | Freq: Four times a day (QID) | RESPIRATORY_TRACT | Status: DC | PRN
  Administered 2016-10-09: 3 mL via RESPIRATORY_TRACT

## 2016-10-04 MED ORDER — DEXMEDETOMIDINE HCL IN NACL 400 MCG/100ML IV SOLN
0.4000 ug/kg/h | INTRAVENOUS | Status: DC
  Administered 2016-10-04: 0.4 ug/kg/h via INTRAVENOUS
  Administered 2016-10-05: 0.3 ug/kg/h via INTRAVENOUS
  Administered 2016-10-05: 0.4 ug/kg/h via INTRAVENOUS
  Filled 2016-10-04 (×3): qty 100

## 2016-10-04 MED ORDER — K PHOS MONO-SOD PHOS DI & MONO 155-852-130 MG PO TABS
500.0000 mg | ORAL_TABLET | ORAL | Status: AC
  Administered 2016-10-04 (×4): 500 mg via ORAL
  Filled 2016-10-04 (×4): qty 2

## 2016-10-04 MED ORDER — DEXMEDETOMIDINE HCL IN NACL 200 MCG/50ML IV SOLN: 0.4000 ug/kg/h | INTRAVENOUS | Status: DC

## 2016-10-04 MED ORDER — DEXTROSE 5 % IV SOLN
5.0000 mg/h | INTRAVENOUS | Status: DC
  Administered 2016-10-04: 5 mg/h via INTRAVENOUS
  Administered 2016-10-04: 7.5 mg/h via INTRAVENOUS
  Filled 2016-10-04 (×2): qty 100

## 2016-10-04 MED ORDER — IPRATROPIUM-ALBUTEROL 0.5-2.5 (3) MG/3ML IN SOLN
3.0000 mL | Freq: Three times a day (TID) | RESPIRATORY_TRACT | Status: DC
  Administered 2016-10-05 – 2016-10-10 (×13): 3 mL via RESPIRATORY_TRACT
  Filled 2016-10-04 (×17): qty 3

## 2016-10-04 NOTE — Progress Notes (Addendum)
[facility] H&P    ASSESSMENT/PLAN   70 year old male with acute respiratory failure, possible secondary to pneumonia and/or ARDS.  PULMONARY A:Acute hypoxic respiratory failure, etiology uncertain. -Bibasilar atelectasis. -Pneumonia. CXR reviewed, mild changes of bronchitis, ETT in position.  -Patient with high initial O2 requirements, suspect acute lung injury.  -Vent PRVC/12/500/8/FiO2 35%. P:   -We'll treat empirically with antibiotics. -Continue ventilator support-decrease PEEP to 5.  CARDIOVASCULAR A: --Afib with RVR, has been on amiodarone for several days without control.  -Increased troponins. -DC amio, start cardizem infusion.   RENAL A:  Hypokalemia.  Will replace.   GASTROINTESTINAL A:  --  HEMATOLOGIC A:  Leukocytosis, likely reactive. P:    INFECTIOUS A:  Pneumonia. -Possible meningitis. P:   Continue empiric antibiotics. -Check cultures. -Continue antibiotics per ID  Micro/culture results:  BCx2 12/15--gram-positive cocci, PCR positive for staph aureus. UC 12/15 . Pending Sputum 12/15 --pending Flu negative.  CSF 12/15: Pending  Antibiotics: Zosyn 12/15>> 12/15 Levaquin 12/15>>12/15 Vancomycin 12/15>> Acyclovir 12/15>> Cefazolin 12/15>> Procalcitonin; 6.55>>4.36  ENDOCRINE A:  SSI.   P:     NEUROLOGIC A:  Acute metabolic encephalopathy. P:   Propofol for sedation. Neurology consulted.   MAJOR EVENTS/TEST RESULTS: LP 12/15;   Best Practices  DVT Prophylaxis: enoxaparin GI Prophylaxis: famotidine.    ---------------------------------------  ---------------------------------------   Name: MERRITT MCCRAVY MRN: [id number] DOB: 09/15/1946    ADMISSION DATE:  10/02/2016   Subjective: Pt sedated, on vent.   Prior to Admission medications   Medication Sig Start Date End Date Taking? Authorizing Provider  aspirin EC 81 MG tablet Take 1 tablet by mouth daily.   Yes Historical Provider, MD    Cyanocobalamin (RA VITAMIN B-12 TR) 1000 MCG TBCR Take 1 tablet by mouth daily.   Yes Historical Provider, MD  cyclobenzaprine (FLEXERIL) 10 MG tablet Take 1 tablet by mouth daily.   Yes Historical Provider, MD  DULoxetine (CYMBALTA) 60 MG capsule Take 1 capsule by mouth at bedtime. 07/29/15  Yes Historical Provider, MD  furosemide (LASIX) 40 MG tablet Take 1 tablet by mouth 2 (two) times daily. 03/19/16  Yes Historical Provider, MD  gabapentin (NEURONTIN) 600 MG tablet Take 1 tablet by mouth 3 (three) times daily. 11/21/12  Yes Historical Provider, MD  lisinopril (PRINIVIL,ZESTRIL) 10 MG tablet Take 1 tablet by mouth daily. 02/10/16  Yes Historical Provider, MD  Loratadine 10 MG CAPS Take 1 capsule by mouth daily.   Yes Historical Provider, MD  meclizine (ANTIVERT) 25 MG tablet Take 1 tablet by mouth 3 (three) times daily. 03/19/16  Yes Historical Provider, MD  metFORMIN (GLUCOPHAGE) 1000 MG tablet Take 1 tablet by mouth 2 (two) times daily. With meals. 03/19/16  Yes Historical Provider, MD  Multiple Vitamin (MULTI-VITAMINS) TABS Take 1 tablet by mouth daily.   Yes Historical Provider, MD  omeprazole (PRILOSEC) 20 MG capsule Take 20 mg by mouth daily.   Yes Historical Provider, MD  Oxcarbazepine (TRILEPTAL) 300 MG tablet Take 1 tablet by mouth 2 (two) times daily. 12/05/12  Yes Historical Provider, MD  pravastatin (PRAVACHOL) 40 MG tablet Take 1 tablet by mouth at bedtime. 02/10/16  Yes Historical Provider, MD  methadone (DOLOPHINE) 5 MG tablet Take 1 tablet by mouth 3 (three) times daily. 09/02/16   Historical Provider, MD  metoprolol tartrate (LOPRESSOR) 25 MG tablet Take 1 tablet by mouth 2 (two) times daily. 07/20/16   Historical Provider, MD   Allergies  Allergen Reactions  . Acetaminophen-Codeine  Other reaction(s): Headache  . Rofecoxib Other (See Comments)    ulcers  . Tetracyclines & Related    REVIEW OF SYSTEMS:   Could not be obtained due to critical illness.   VITAL SIGNS: Temp:   [99.3 F (37.4 C)-101.8 F (38.8 C)] 100.4 F (38 C) (12/17 0800) Pulse Rate:  [46-140] 106 (12/17 0800) Resp:  [10-24] 13 (12/17 0800) BP: (102-149)/(65-97) 145/83 (12/17 0800) SpO2:  [95 %-100 %] 98 % (12/17 0829) FiO2 (%):  [35 %-40 %] 35 % (12/17 0829) Weight:  [126.8 kg (279 lb 8.7 oz)] 126.8 kg (279 lb 8.7 oz) (12/17 0100) HEMODYNAMICS:   VENTILATOR SETTINGS: Vent Mode: PRVC FiO2 (%):  [35 %-40 %] 35 % Set Rate:  [12 bmp-24 bmp] 12 bmp Vt Set:  [500 mL] 500 mL PEEP:  [8 cmH20-10 cmH20] 8 cmH20 INTAKE / OUTPUT:  Intake/Output Summary (Last 24 hours) at 10/04/16 1052 Last data filed at 10/04/16 0800  Gross per 24 hour  Intake          2368.57 ml  Output             1160 ml  Net          1208.57 ml    Physical Examination:   VS: BP (!) 145/83 (BP Location: Right Arm)   Pulse (!) 106   Temp (!) 100.4 F (38 C) (Core (Comment))   Resp 13   Ht '5\' 11"'$  (1.803 m)   Wt 126.8 kg (279 lb 8.7 oz)   SpO2 98%   BMI 38.99 kg/m   General Appearance: No distress  Neuro:without focal findings, mental status reduced.  HEENT: PERRLA, EOM intact, no ptosis. Pulmonary: normal breath sounds. CardiovascularNormal S1,S2.  No m/r/g.    Abdomen: Benign, Soft, non-tender. Renal:  No costovertebral tenderness  GU:  Not performed at this time. Endoc: No evident thyromegaly, no signs of acromegaly. Skin:   warm, no rashes, no ecchymosis  Extremities: normal, no cyanosis, clubbing, no edema, warm with normal capillary refill.    LABS: Reviewed   LABORATORY PANEL:   CBC  Recent Labs Lab 10/04/16 0511  WBC 4.1  HGB 11.7*  HCT 34.7*  PLT 88*    Chemistries   Recent Labs Lab 10/02/16 1826  10/04/16 0511  NA 131*  < > 137  K 3.2*  < > 3.7  CL 100*  < > 105  CO2 20*  < > 27  GLUCOSE 221*  < > 162*  BUN 13  < > 13  CREATININE 0.78  < > 0.84  CALCIUM 7.5*  < > 8.0*  MG 1.7  < > 2.2  PHOS  --   --  2.1*  AST 122*  --   --   ALT 59  --   --   ALKPHOS 66  --   --     BILITOT 0.7  --   --   < > = values in this interval not displayed.   Recent Labs Lab 10/03/16 1208 10/03/16 1536 10/03/16 1938 10/03/16 2352 10/04/16 0342 10/04/16 0741  GLUCAP 183* 232* 174* 180* 144* 174*    Recent Labs Lab 10/02/16 1800 10/03/16 1615 10/04/16 0500  PHART 7.28* 7.34* 7.37  PCO2ART 50* 50* 49*  PO2ART 96 95 138*    Recent Labs Lab 10/02/16 0816 10/02/16 1826  AST 97* 122*  ALT 44 59  ALKPHOS 76 66  BILITOT 0.7 0.7  ALBUMIN 3.5 3.2*    Cardiac Enzymes  Recent  Labs Lab 10/02/16 0816  TROPONINI 0.06*    RADIOLOGY:  Dg Chest 1 View  Result Date: 10/04/2016 CLINICAL DATA:  Dyspnea. EXAM: CHEST 1 VIEW COMPARISON:  10/03/2016. FINDINGS: Endotracheal tube in satisfactory position. Nasogastric tube extending into the stomach. Prosthetic heart valve. Cervical spine fixation hardware and thoracic spine degenerative changes. Stable mildly enlarged cardiac silhouette and prominence of the interstitial markings. Left basilar opacity with a more patchy appearance today. IMPRESSION: Mildly increased left basilar pneumonia or patchy atelectasis. Stable mild cardiomegaly and chronic interstitial lung disease. Electronically Signed   By: Claudie Revering M.D.   On: 10/04/2016 07:36   Ct Head Wo Contrast  Result Date: 10/02/2016 CLINICAL DATA:  Shortness of Breath, fall, altered mental status EXAM: CT HEAD WITHOUT CONTRAST TECHNIQUE: Contiguous axial images were obtained from the base of the skull through the vertex without intravenous contrast. COMPARISON:  06/18/2015 FINDINGS: Brain: No intracranial hemorrhage, mass effect or midline shift. Stable atrophy and chronic white matter disease. No definite acute cortical infarction. No mass lesion is noted on this unenhanced scan. Vascular: Mild atherosclerotic calcifications of carotid siphon. Skull: No skull fracture is noted. Sinuses/Orbits: There is mucosal thickening with small air-fluid level right maxillary sinus.  Mucosal thickening noted bilateral ethmoid air cells. Partially visualized NG tube. Other: None IMPRESSION: No acute intracranial abnormality. No definite acute cortical infarction. Stable atrophy and chronic white matter disease. Paranasal sinuses disease as described above. Electronically Signed   By: Lahoma Crocker M.D.   On: 10/02/2016 12:30   Ct Angio Chest Pe W And/or Wo Contrast  Result Date: 10/02/2016 CLINICAL DATA:  Increased shortness of breath and respiratory distress. Altered mental status. Urinary incontinence. EXAM: CT ANGIOGRAPHY CHEST WITH CONTRAST TECHNIQUE: Multidetector CT imaging of the chest was performed using the standard protocol during bolus administration of intravenous contrast. Multiplanar CT image reconstructions and MIPs were obtained to evaluate the vascular anatomy. CONTRAST:  75 cc Isovue 370. COMPARISON:  None. FINDINGS: Cardiovascular: Image quality is rather degraded by respiratory motion making assessment of the segmental and subsegmental pulmonary arteries difficult. No central or lobar pulmonary embolus. Atherosclerotic calcification of the arterial vasculature, including coronary arteries. Heart is mildly enlarged. No pericardial effusion. Mediastinum/Nodes: No pathologically enlarged mediastinal, hilar or axillary lymph nodes. Nasogastric tube is seen in the esophagus, extending into the stomach. Lungs/Pleura: Image quality is degraded by respiratory motion. Endotracheal tube terminates approximately 1 cm above the carina. There is bilateral lower lobe collapse/consolidation. No pleural fluid. Airway is otherwise unremarkable. Upper Abdomen: There is a great deal of artifact in the posterior upper abdomen due to the patient's arms. Visualized portions of the liver, adrenal glands, kidneys, spleen, pancreas, stomach and bowel are grossly unremarkable. Nasogastric terminates in the stomach. Musculoskeletal: No worrisome lytic or sclerotic lesions. Degenerative changes are  seen in the spine. Review of the MIP images confirms the above findings. IMPRESSION: 1. Image quality is degraded by respiratory motion, limiting evaluation for segmental and subsegmental pulmonary emboli. No central or lobar pulmonary embolus. 2. Endotracheal tube terminates approximately 1 cm above the carina. Consider retracting approximately 2-3 cm for better positioning, as clinically indicated. 3. Bilateral lower lobe dependent collapse/consolidation, the majority of which likely represents atelectasis. Difficult to exclude aspiration. 4. Aortic atherosclerosis (ICD10-170.0). Coronary artery calcification. Electronically Signed   By: Lorin Picket M.D.   On: 10/02/2016 12:32   Dg Chest Port 1 View  Result Date: 10/03/2016 CLINICAL DATA:  Shortness of Breath EXAM: PORTABLE CHEST 1 VIEW COMPARISON:  10/02/2016 FINDINGS:  Endotracheal tube and NG tube remain in place, unchanged. Prior median sternotomy. Heart is borderline in size. Left lower lobe airspace opacity mildly increased since prior study. No confluent opacity on the right. No visible effusion. IMPRESSION: Increasing left lower lobe opacity concerning for pneumonia. Electronically Signed   By: Rolm Baptise M.D.   On: 10/03/2016 07:20   Dg Cyndy Freeze Guided Loc Of Needle/cath Tip For Spinal Inject Lt  Result Date: 10/02/2016 David A Martinique, MD     10/02/2016  5:25 PM Fluoro guded lumbar puncture at L3-$ level with acquisition of 3 cc clear colorless spinal fluid. See dictated Radiology report.      --Marda Stalker, MD.  Board Certified in Internal Medicine, Pulmonary Medicine, [place], and Sleep Medicine.  ICU Pager [phone] [facility] Office Number: [phone]  Patricia Pesa, M.D.  Vilinda Boehringer, M.D.  Merton Border, M.D   10/04/2016, 10:52 AM   Critical Care Attestation.  I have personally obtained a history, examined the patient, evaluated laboratory and imaging results,  formulated the assessment and plan and placed orders. The Patient requires high complexity decision making for assessment and support, frequent evaluation and titration of therapies, application of advanced monitoring technologies and extensive interpretation of multiple databases. The patient has critical illness that could lead imminently to failure of 1 or more organ systems and requires the highest level of physician preparedness to intervene.  Critical Care Time devoted to patient care services described in this note is 45 minutes and is exclusive of time spent in procedures.

## 2016-10-04 NOTE — Progress Notes (Signed)
[facility] [place] Progress Note Patient Name: [name] DOB: [name] 08, 1946 MRN: [id number]   Date of Service  10/04/2016  HPI/Events of Note  Pt rolled out of bed onto the floor.  No overt changes in neuro status   eICU Interventions  Will check Neuro VS q2h, pt not fully anticoagulated.     Intervention Category Major Interventions: Delirium, psychosis, severe agitation - evaluation and management  Asencion Noble 10/04/2016, 4:01 PM

## 2016-10-04 NOTE — Progress Notes (Signed)
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

## 2016-10-04 NOTE — Progress Notes (Signed)
Walked into room to find pt on floor. Bed alarm was on and bed was in lowest position. Bed alarm was not alarming. Pt conscious and alert, not c/o any pain or injury. Pt denied neck or back pain upon assessment. No injuries noted. Pt moved to chair and then moved to bed. eLink called and Dr. Joya Gaskins camera'd in. Dr. Joya Gaskins ordered bedside sitting and non-violent restraints. [employer] telesitter placed in room.   Pt's family notified of fall. Pt's wife stated that each time pt is hospitalized he becomes very agitated and confused and has some type of fall in the hospital due to confusion. Pt's wife also stated that pt takes Methadone at home and has been without same for >48 hours at this time, and she is worried that pt might be experiencing withdrawals. Dr. Joya Gaskins at [facility] contacted again with this information. Dr. Joya Gaskins ordered 5 mg Methadone. Will continue to monitor for changes or agitation.

## 2016-10-04 NOTE — Progress Notes (Signed)
Pt very confused and agitated. Refusing breathing tx yelling for RT and RN to leave him alone and get out of his room. PT pulled IV out, swinging hands and pulling at cardiac cords.

## 2016-10-04 NOTE — Progress Notes (Signed)
[place] for electrolyte management, constipation prevention.   Pharmacy consulted for above for 70 yo male ICU patient admitted with acute respiratory failure, ?meningitis/?PNA. Patient currently sedated with fentanyl drip.   Plan:     1. Electrolytes: Phos: 2.1. Will give KPhos 500 mg q4 hours x 4 doses. Will recheck electrolytes in am.   2. Constipation: will continue senna/docusate 1 tablet BID   Allergies  Allergen Reactions  . Acetaminophen-Codeine     Other reaction(s): Headache  . Rofecoxib Other (See Comments)    ulcers  . Tetracyclines & Related     Patient Measurements: Height: '5\' 11"'$  (180.3 cm) Weight: 279 lb 8.7 oz (126.8 kg) IBW/kg (Calculated) : 75.3 Adjusted Body Weight: 94kg  Ideal Body Weight: 75kg  Vital Signs: Temp: 100.4 F (38 C) (12/17 0800) Temp Source: Core (Comment) (12/17 0800) BP: 145/83 (12/17 0800) Pulse Rate: 106 (12/17 0800) Intake/Output from previous day: 12/16 0701 - 12/17 0700 In: 2571.3 [I.V.:1772.3; NG/GT:130; IV Piggyback:669] Out: 1160 [Urine:860; Emesis/NG output:300] Intake/Output from this shift: Total I/O In: 22.3 [I.V.:22.3] Out: 75 [Urine:75]  Labs:  Recent Labs  10/02/16 0816 10/02/16 1826 10/03/16 0433 10/04/16 0511  WBC 14.2* 12.2* 7.5 4.1  HGB 12.5* 13.5 12.4* 11.7*  HCT 36.8* 40.9 36.5* 34.7*  PLT 145* 117* 113* 88*  CREATININE 0.85 0.78 0.78 0.84  MG  --  1.7 2.2 2.2  PHOS  --   --   --  2.1*  ALBUMIN 3.5 3.2*  --   --   PROT 7.0 6.9  --   --   AST 97* 122*  --   --   ALT 44 59  --   --   ALKPHOS 76 66  --   --   BILITOT 0.7 0.7  --   --    Estimated Creatinine Clearance: 111 mL/min (by C-G formula based on SCr of 0.84 mg/dL).   Microbiology: Recent Results (from the past 720 hour(s))  Blood Culture (routine x 2)     Status: None (Preliminary result)   Collection Time: 10/02/16  8:16 AM  Result Value Ref Range Status   Specimen Description BLOOD RIGHT  ARM  Final   Special Requests   Final    BOTTLES DRAWN AEROBIC AND ANAEROBIC  AER 8CC ANA 10CC   Culture  Setup Time   Final    GRAM POSITIVE COCCI IN BOTH AEROBIC AND ANAEROBIC BOTTLES CRITICAL VALUE NOTED.  VALUE IS CONSISTENT WITH PREVIOUSLY REPORTED AND CALLED VALUE.    Culture GRAM POSITIVE COCCI  Final   Report Status PENDING  Incomplete  Blood Culture (routine x 2)     Status: Abnormal   Collection Time: 10/02/16  8:16 AM  Result Value Ref Range Status   Specimen Description BLOOD LEFT ANTECUBITAL  Final   Special Requests   Final    BOTTLES DRAWN AEROBIC AND ANAEROBIC  AER 11CC ANA 12CC   Culture  Setup Time   Final    GRAM POSITIVE COCCI IN BOTH AEROBIC AND ANAEROBIC BOTTLES CRITICAL RESULT CALLED TO, READ BACK BY AND VERIFIED WITH: Carmin Richmond @ 7062 10/02/16 by [facility] Performed at [place] (A)  Final   Report Status 10/04/2016 FINAL  Final   Organism ID, Bacteria STAPHYLOCOCCUS AUREUS  Final      Susceptibility   Staphylococcus aureus - MIC*    CIPROFLOXACIN <=0.5 SENSITIVE Sensitive     ERYTHROMYCIN 0.5 SENSITIVE  Sensitive     GENTAMICIN <=0.5 SENSITIVE Sensitive     OXACILLIN 0.5 SENSITIVE Sensitive     TETRACYCLINE <=1 SENSITIVE Sensitive     VANCOMYCIN 1 SENSITIVE Sensitive     TRIMETH/SULFA <=10 SENSITIVE Sensitive     CLINDAMYCIN <=0.25 SENSITIVE Sensitive     RIFAMPIN <=0.5 SENSITIVE Sensitive     Inducible Clindamycin NEGATIVE Sensitive     * STAPHYLOCOCCUS AUREUS  Urine culture     Status: None   Collection Time: 10/02/16  8:16 AM  Result Value Ref Range Status   Specimen Description URINE, RANDOM  Final   Special Requests NONE  Final   Culture NO GROWTH Performed at [facility]   Final   Report Status 10/03/2016 FINAL  Final  Blood Culture ID Panel (Reflexed)     Status: Abnormal   Collection Time: 10/02/16  8:16 AM  Result Value Ref Range Status   Enterococcus species NOT DETECTED NOT DETECTED  Final   Listeria monocytogenes NOT DETECTED NOT DETECTED Final   Staphylococcus species DETECTED (A) NOT DETECTED Final    Comment: CRITICAL RESULT CALLED TO, READ BACK BY AND VERIFIED WITH: Carmin Richmond @ 9678 10/02/16 by [place]    Staphylococcus aureus DETECTED (A) NOT DETECTED Final    Comment: CRITICAL RESULT CALLED TO, READ BACK BY AND VERIFIED WITH: Carmin Richmond @ 9381 10/02/16 by [facility]    Methicillin resistance NOT DETECTED NOT DETECTED Final   Streptococcus species NOT DETECTED NOT DETECTED Final   Streptococcus agalactiae NOT DETECTED NOT DETECTED Final   Streptococcus pneumoniae NOT DETECTED NOT DETECTED Final   Streptococcus pyogenes NOT DETECTED NOT DETECTED Final   Acinetobacter baumannii NOT DETECTED NOT DETECTED Final   Enterobacteriaceae species NOT DETECTED NOT DETECTED Final   Enterobacter cloacae complex NOT DETECTED NOT DETECTED Final   Escherichia coli NOT DETECTED NOT DETECTED Final   Klebsiella oxytoca NOT DETECTED NOT DETECTED Final   Klebsiella pneumoniae NOT DETECTED NOT DETECTED Final   Proteus species NOT DETECTED NOT DETECTED Final   Serratia marcescens NOT DETECTED NOT DETECTED Final   Haemophilus influenzae NOT DETECTED NOT DETECTED Final   Neisseria meningitidis NOT DETECTED NOT DETECTED Final   Pseudomonas aeruginosa NOT DETECTED NOT DETECTED Final   Candida albicans NOT DETECTED NOT DETECTED Final   Candida glabrata NOT DETECTED NOT DETECTED Final   Candida krusei NOT DETECTED NOT DETECTED Final   Candida parapsilosis NOT DETECTED NOT DETECTED Final   Candida tropicalis NOT DETECTED NOT DETECTED Final  MRSA PCR Screening     Status: None   Collection Time: 10/02/16  2:40 PM  Result Value Ref Range Status   MRSA by PCR NEGATIVE NEGATIVE Final    Comment:        The GeneXpert MRSA Assay (FDA approved for NASAL specimens only), is one component of a comprehensive MRSA colonization surveillance program. It is not intended to diagnose  MRSA infection nor to guide or monitor treatment for MRSA infections.   CSF culture     Status: None (Preliminary result)   Collection Time: 10/02/16  3:13 PM  Result Value Ref Range Status   Specimen Description CSF  Final   Special Requests Normal  Final   Gram Stain   Final    RARE WBC SEEN RARE RED BLOOD CELLS NO ORGANISMS SEEN    Culture   Final    NO GROWTH 1 DAY Performed at [facility]    Report Status PENDING  Incomplete  Culture, fungus without  smear     Status: None (Preliminary result)   Collection Time: 10/02/16  3:14 PM  Result Value Ref Range Status   Specimen Description CSF  Final   Special Requests Normal  Final   Culture   Final    NO FUNGUS ISOLATED AFTER 1 DAY Performed at [facility]    Report Status PENDING  Incomplete  Culture, expectorated sputum-assessment     Status: None   Collection Time: 10/02/16  6:18 PM  Result Value Ref Range Status   Specimen Description TRACHEAL ASPIRATE  Final   Special Requests NONE  Final   Sputum evaluation THIS SPECIMEN IS ACCEPTABLE FOR SPUTUM CULTURE  Final   Report Status 10/02/2016 FINAL  Final  Culture, respiratory (NON-Expectorated)     Status: None (Preliminary result)   Collection Time: 10/02/16  6:18 PM  Result Value Ref Range Status   Specimen Description TRACHEAL ASPIRATE  Final   Special Requests NONE Reflexed from [id number]  Final   Gram Stain   Final    ABUNDANT WBC PRESENT, PREDOMINANTLY MONONUCLEAR FEW GRAM NEGATIVE COCCI IN PAIRS RARE GRAM POSITIVE RODS Performed at [facility]    Culture PENDING  Incomplete   Report Status PENDING  Incomplete    Medical History: Past Medical History:  Diagnosis Date  . Chronic pain   . Coronary artery disease   . Diabetes mellitus without complication ([place])   . DJD (degenerative joint disease)   . GERD (gastroesophageal reflux disease)   . Hyperlipemia   . Hypertension   . Insomnia   . Neuropathy [facility])    Pharmacy will  continue to monitor and adjust per consult.   Alayzha An D 10/04/2016,8:53 AM

## 2016-10-04 NOTE — Progress Notes (Signed)
ANTIBIOTIC CONSULT NOTE - FOLLOW UP   Pharmacy Consult for Cefazolin, Acyclovir Indication: bacteremia, suspected meningitis  Allergies  Allergen Reactions  . Acetaminophen-Codeine     Other reaction(s): Headache  . Rofecoxib Other (See Comments)    ulcers  . Tetracyclines & Related     Patient Measurements: Height: '5\' 11"'$  (180.3 cm) Weight: 279 lb 8.7 oz (126.8 kg) IBW/kg (Calculated) : 75.3 Adjusted Body Weight: 94.2 kg   Vital Signs: Temp: 100.4 F (38 C) (12/17 0800) Temp Source: Core (Comment) (12/17 0800) BP: 145/83 (12/17 0800) Pulse Rate: 106 (12/17 0800) Intake/Output from previous day: 12/16 0701 - 12/17 0700 In: 2571.3 [I.V.:1772.3; NG/GT:130; IV Piggyback:669] Out: 1160 [Urine:860; Emesis/NG output:300] Intake/Output from this shift: Total I/O In: 22.3 [I.V.:22.3] Out: 75 [Urine:75]  Labs:  Recent Labs  10/02/16 1826 10/03/16 0433 10/04/16 0511  WBC 12.2* 7.5 4.1  HGB 13.5 12.4* 11.7*  PLT 117* 113* 88*  CREATININE 0.78 0.78 0.84   Estimated Creatinine Clearance: 111 mL/min (by C-G formula based on SCr of 0.84 mg/dL).  Recent Labs  10/04/16 0511  [place] <4*     Microbiology: Recent Results (from the past 720 hour(s))  Blood Culture (routine x 2)     Status: None (Preliminary result)   Collection Time: 10/02/16  8:16 AM  Result Value Ref Range Status   Specimen Description BLOOD RIGHT ARM  Final   Special Requests   Final    BOTTLES DRAWN AEROBIC AND ANAEROBIC  AER 8CC ANA 10CC   Culture  Setup Time   Final    GRAM POSITIVE COCCI IN BOTH AEROBIC AND ANAEROBIC BOTTLES CRITICAL VALUE NOTED.  VALUE IS CONSISTENT WITH PREVIOUSLY REPORTED AND CALLED VALUE.    Culture GRAM POSITIVE COCCI  Final   Report Status PENDING  Incomplete  Blood Culture (routine x 2)     Status: Abnormal   Collection Time: 10/02/16  8:16 AM  Result Value Ref Range Status   Specimen Description BLOOD LEFT ANTECUBITAL  Final   Special Requests   Final     BOTTLES DRAWN AEROBIC AND ANAEROBIC  AER 11CC ANA 12CC   Culture  Setup Time   Final    GRAM POSITIVE COCCI IN BOTH AEROBIC AND ANAEROBIC BOTTLES CRITICAL RESULT CALLED TO, READ BACK BY AND VERIFIED WITH: Carmin Richmond @ 0254 10/02/16 by [facility] Performed at [place] (A)  Final   Report Status 10/04/2016 FINAL  Final   Organism ID, Bacteria STAPHYLOCOCCUS AUREUS  Final      Susceptibility   Staphylococcus aureus - MIC*    CIPROFLOXACIN <=0.5 SENSITIVE Sensitive     ERYTHROMYCIN 0.5 SENSITIVE Sensitive     GENTAMICIN <=0.5 SENSITIVE Sensitive     OXACILLIN 0.5 SENSITIVE Sensitive     TETRACYCLINE <=1 SENSITIVE Sensitive     VANCOMYCIN 1 SENSITIVE Sensitive     TRIMETH/SULFA <=10 SENSITIVE Sensitive     CLINDAMYCIN <=0.25 SENSITIVE Sensitive     RIFAMPIN <=0.5 SENSITIVE Sensitive     Inducible Clindamycin NEGATIVE Sensitive     * STAPHYLOCOCCUS AUREUS  Urine culture     Status: None   Collection Time: 10/02/16  8:16 AM  Result Value Ref Range Status   Specimen Description URINE, RANDOM  Final   Special Requests NONE  Final   Culture NO GROWTH Performed at [facility]   Final   Report Status 10/03/2016 FINAL  Final  Blood Culture ID Panel (Reflexed)  Status: Abnormal   Collection Time: 10/02/16  8:16 AM  Result Value Ref Range Status   Enterococcus species NOT DETECTED NOT DETECTED Final   Listeria monocytogenes NOT DETECTED NOT DETECTED Final   Staphylococcus species DETECTED (A) NOT DETECTED Final    Comment: CRITICAL RESULT CALLED TO, READ BACK BY AND VERIFIED WITH: Carmin Richmond @ 0630 10/02/16 by [place]    Staphylococcus aureus DETECTED (A) NOT DETECTED Final    Comment: CRITICAL RESULT CALLED TO, READ BACK BY AND VERIFIED WITH: Carmin Richmond @ 1601 10/02/16 by [facility]    Methicillin resistance NOT DETECTED NOT DETECTED Final   Streptococcus species NOT DETECTED NOT DETECTED Final   Streptococcus agalactiae NOT DETECTED  NOT DETECTED Final   Streptococcus pneumoniae NOT DETECTED NOT DETECTED Final   Streptococcus pyogenes NOT DETECTED NOT DETECTED Final   Acinetobacter baumannii NOT DETECTED NOT DETECTED Final   Enterobacteriaceae species NOT DETECTED NOT DETECTED Final   Enterobacter cloacae complex NOT DETECTED NOT DETECTED Final   Escherichia coli NOT DETECTED NOT DETECTED Final   Klebsiella oxytoca NOT DETECTED NOT DETECTED Final   Klebsiella pneumoniae NOT DETECTED NOT DETECTED Final   Proteus species NOT DETECTED NOT DETECTED Final   Serratia marcescens NOT DETECTED NOT DETECTED Final   Haemophilus influenzae NOT DETECTED NOT DETECTED Final   Neisseria meningitidis NOT DETECTED NOT DETECTED Final   Pseudomonas aeruginosa NOT DETECTED NOT DETECTED Final   Candida albicans NOT DETECTED NOT DETECTED Final   Candida glabrata NOT DETECTED NOT DETECTED Final   Candida krusei NOT DETECTED NOT DETECTED Final   Candida parapsilosis NOT DETECTED NOT DETECTED Final   Candida tropicalis NOT DETECTED NOT DETECTED Final  MRSA PCR Screening     Status: None   Collection Time: 10/02/16  2:40 PM  Result Value Ref Range Status   MRSA by PCR NEGATIVE NEGATIVE Final    Comment:        The GeneXpert MRSA Assay (FDA approved for NASAL specimens only), is one component of a comprehensive MRSA colonization surveillance program. It is not intended to diagnose MRSA infection nor to guide or monitor treatment for MRSA infections.   CSF culture     Status: None (Preliminary result)   Collection Time: 10/02/16  3:13 PM  Result Value Ref Range Status   Specimen Description CSF  Final   Special Requests Normal  Final   Gram Stain   Final    RARE WBC SEEN RARE RED BLOOD CELLS NO ORGANISMS SEEN    Culture   Final    NO GROWTH 1 DAY Performed at [facility]    Report Status PENDING  Incomplete  Culture, fungus without smear     Status: None (Preliminary result)   Collection Time: 10/02/16  3:14 PM   Result Value Ref Range Status   Specimen Description CSF  Final   Special Requests Normal  Final   Culture   Final    NO FUNGUS ISOLATED AFTER 1 DAY Performed at [facility]    Report Status PENDING  Incomplete  Culture, expectorated sputum-assessment     Status: None   Collection Time: 10/02/16  6:18 PM  Result Value Ref Range Status   Specimen Description TRACHEAL ASPIRATE  Final   Special Requests NONE  Final   Sputum evaluation THIS SPECIMEN IS ACCEPTABLE FOR SPUTUM CULTURE  Final   Report Status 10/02/2016 FINAL  Final  Culture, respiratory (NON-Expectorated)     Status: None (Preliminary result)   Collection Time:  10/02/16  6:18 PM  Result Value Ref Range Status   Specimen Description TRACHEAL ASPIRATE  Final   Special Requests NONE Reflexed from [id number]  Final   Gram Stain   Final    ABUNDANT WBC PRESENT, PREDOMINANTLY MONONUCLEAR FEW GRAM NEGATIVE COCCI IN PAIRS RARE GRAM POSITIVE RODS Performed at [facility]    Culture PENDING  Incomplete   Report Status PENDING  Incomplete    Medical History: Past Medical History:  Diagnosis Date  . Chronic pain   . Coronary artery disease   . Diabetes mellitus without complication ([place])   . DJD (degenerative joint disease)   . GERD (gastroesophageal reflux disease)   . Hyperlipemia   . Hypertension   . Insomnia   . Neuropathy ([place])     Medications:  Prescriptions Prior to Admission  Medication Sig Dispense Refill Last Dose  . aspirin EC 81 MG tablet Take 1 tablet by mouth daily.   Unknown at Unknown  . Cyanocobalamin (RA VITAMIN B-12 TR) 1000 MCG TBCR Take 1 tablet by mouth daily.   Unknown at Unknown  . cyclobenzaprine (FLEXERIL) 10 MG tablet Take 1 tablet by mouth daily.   Unknown at Unknown  . DULoxetine (CYMBALTA) 60 MG capsule Take 1 capsule by mouth at bedtime.   Unknown at Unknown  . furosemide (LASIX) 40 MG tablet Take 1 tablet by mouth 2 (two) times daily.   Unknown at Unknown  . gabapentin  (NEURONTIN) 600 MG tablet Take 1 tablet by mouth 3 (three) times daily.   Unknown at Unknown  . lisinopril (PRINIVIL,ZESTRIL) 10 MG tablet Take 1 tablet by mouth daily.   Unknown at Unknown  . Loratadine 10 MG CAPS Take 1 capsule by mouth daily.   Unknown at Unknown  . meclizine (ANTIVERT) 25 MG tablet Take 1 tablet by mouth 3 (three) times daily.   Unknown at Unknown  . metFORMIN (GLUCOPHAGE) 1000 MG tablet Take 1 tablet by mouth 2 (two) times daily. With meals.   Unknown at Unknown  . Multiple Vitamin (MULTI-VITAMINS) TABS Take 1 tablet by mouth daily.   Unknown at Unknown  . omeprazole (PRILOSEC) 20 MG capsule Take 20 mg by mouth daily.   Unknown at Unknown  . Oxcarbazepine (TRILEPTAL) 300 MG tablet Take 1 tablet by mouth 2 (two) times daily.   Unknown at Unknown  . pravastatin (PRAVACHOL) 40 MG tablet Take 1 tablet by mouth at bedtime.    at Unknown  . methadone (DOLOPHINE) 5 MG tablet Take 1 tablet by mouth 3 (three) times daily.   Unknown at Unknown  . metoprolol tartrate (LOPRESSOR) 25 MG tablet Take 1 tablet by mouth 2 (two) times daily.   Unknown at Unknown   Assessment: TBW = 122.5 kg ,  CrCl = 114.5 ml/min   Goal of Therapy:  resolution of infection  Plan:  Expected duration 7 days with resolution of temperature and/or normalization of WBC   Will continue Cefazolin 2 gm IV Q8H on 12/15 @ 23:00.  Will continue Acyclovir 950  mg IV Q8H. Dosing based on ABW based on Epic  Dosing parameters.   Sunny Aguon D 10/04/2016,8:57 AM

## 2016-10-04 NOTE — Progress Notes (Signed)
Pt extubated without complications, no stridor or shortness of breath, placed on 3lpm [place], sats 95%, respiratory rate 18/min, will continue to monitor

## 2016-10-05 DIAGNOSIS — J9601 Acute respiratory failure with hypoxia: Secondary | ICD-10-CM | POA: Diagnosis not present

## 2016-10-05 DIAGNOSIS — A4901 Methicillin susceptible Staphylococcus aureus infection, unspecified site: Secondary | ICD-10-CM | POA: Diagnosis not present

## 2016-10-05 DIAGNOSIS — G9340 Encephalopathy, unspecified: Secondary | ICD-10-CM | POA: Diagnosis not present

## 2016-10-05 LAB — CBC
HEMATOCRIT: 33.7 % — AB (ref 40.0–52.0)
HEMOGLOBIN: 11.4 g/dL — AB (ref 13.0–18.0)
MCH: 28.5 pg (ref 26.0–34.0)
MCHC: 33.7 g/dL (ref 32.0–36.0)
MCV: 84.8 fL (ref 80.0–100.0)
Platelets: 105 10*3/uL — ABNORMAL LOW (ref 150–440)
RBC: 3.98 MIL/uL — AB (ref 4.40–5.90)
RDW: 15.9 % — ABNORMAL HIGH (ref 11.5–14.5)
WBC: 6.1 10*3/uL (ref 3.8–10.6)

## 2016-10-05 LAB — CULTURE, RESPIRATORY: CULTURE: NORMAL

## 2016-10-05 LAB — GLUCOSE, CAPILLARY
GLUCOSE-CAPILLARY: 112 mg/dL — AB (ref 65–99)
GLUCOSE-CAPILLARY: 119 mg/dL — AB (ref 65–99)
GLUCOSE-CAPILLARY: 136 mg/dL — AB (ref 65–99)
GLUCOSE-CAPILLARY: 150 mg/dL — AB (ref 65–99)
Glucose-Capillary: 137 mg/dL — ABNORMAL HIGH (ref 65–99)
Glucose-Capillary: 139 mg/dL — ABNORMAL HIGH (ref 65–99)

## 2016-10-05 LAB — BASIC METABOLIC PANEL
ANION GAP: 7 (ref 5–15)
BUN: 13 mg/dL (ref 6–20)
CHLORIDE: 104 mmol/L (ref 101–111)
CO2: 27 mmol/L (ref 22–32)
Calcium: 8 mg/dL — ABNORMAL LOW (ref 8.9–10.3)
Creatinine, Ser: 0.75 mg/dL (ref 0.61–1.24)
GFR calc Af Amer: >60 mL/min (ref >60)
GFR calc non Af Amer: >60 mL/min (ref >60)
GLUCOSE: 131 mg/dL — AB (ref 65–99)
POTASSIUM: 3.3 mmol/L — AB (ref 3.5–5.1)
Sodium: 138 mmol/L (ref 135–145)

## 2016-10-05 LAB — CULTURE, BLOOD (ROUTINE X 2)

## 2016-10-05 LAB — MAGNESIUM: Magnesium: 2.3 mg/dL (ref 1.7–2.4)

## 2016-10-05 LAB — PHOSPHORUS: Phosphorus: 2.9 mg/dL (ref 2.5–4.6)

## 2016-10-05 MED ORDER — ALPRAZOLAM 0.5 MG PO TABS
0.5000 mg | ORAL_TABLET | Freq: Three times a day (TID) | ORAL | Status: DC
  Administered 2016-10-05 – 2016-10-10 (×16): 0.5 mg via ORAL
  Filled 2016-10-05 (×16): qty 1

## 2016-10-05 MED ORDER — FAMOTIDINE 20 MG PO TABS
20.0000 mg | ORAL_TABLET | Freq: Two times a day (BID) | ORAL | Status: DC
  Administered 2016-10-05 – 2016-10-10 (×11): 20 mg via ORAL
  Filled 2016-10-05 (×11): qty 1

## 2016-10-05 MED ORDER — TAB-A-VITE/IRON PO TABS
1.0000 | ORAL_TABLET | Freq: Every day | ORAL | Status: DC
  Administered 2016-10-05 – 2016-10-09 (×5): 1 via ORAL
  Filled 2016-10-05 (×7): qty 1

## 2016-10-05 MED ORDER — POTASSIUM CHLORIDE 20 MEQ PO PACK
40.0000 meq | PACK | Freq: Once | ORAL | Status: AC
  Administered 2016-10-05: 40 meq via ORAL
  Filled 2016-10-05: qty 2

## 2016-10-05 MED ORDER — PRAVASTATIN SODIUM 20 MG PO TABS
40.0000 mg | ORAL_TABLET | Freq: Every day | ORAL | Status: DC
  Administered 2016-10-05 – 2016-10-09 (×5): 40 mg via ORAL
  Filled 2016-10-05 (×2): qty 2
  Filled 2016-10-05: qty 1
  Filled 2016-10-05 (×2): qty 2

## 2016-10-05 MED ORDER — GABAPENTIN 600 MG PO TABS
600.0000 mg | ORAL_TABLET | Freq: Three times a day (TID) | ORAL | Status: DC
  Administered 2016-10-05 – 2016-10-07 (×9): 600 mg via ORAL
  Filled 2016-10-05 (×9): qty 1

## 2016-10-05 MED ORDER — DULOXETINE HCL 60 MG PO CPEP
60.0000 mg | ORAL_CAPSULE | Freq: Every day | ORAL | Status: DC
  Administered 2016-10-05 – 2016-10-10 (×6): 60 mg via ORAL
  Filled 2016-10-05 (×6): qty 1

## 2016-10-05 NOTE — Progress Notes (Signed)
CONCERNING: IV to Oral Route Change Policy  RECOMMENDATION: This patient is receiving famotidine by the intravenous route.  Based on criteria approved by the Pharmacy and Therapeutics Committee, the intravenous medication(s) is/are being converted to the equivalent oral dose form(s).   DESCRIPTION: These criteria include:  The patient is eating (either orally or via tube) and/or has been taking other orally administered medications for a least 24 hours  The patient has no evidence of active gastrointestinal bleeding or impaired GI absorption (gastrectomy, short bowel, patient on TNA or NPO).  If you have questions about this conversion, please contact the Pharmacy Department  '[]'$   [phone] )  [facility] '[x]'$   [phone] )  [facility] '[]'$   [phone] )  [facility] '[]'$   [phone] )  [facility] '[]'$   [phone] )  [place], [facility] 10/05/2016 9:28 AM

## 2016-10-05 NOTE — Progress Notes (Signed)
PT Cancellation Note  Patient Details Name: [name] MRN: [id number] DOB: [birth year]   Cancelled Treatment:    Reason Eval/Treat Not Completed: Other (comment) (Nursing requested PT to hold eval this date secondary to pt on precedex drip at this time.)  Will attempt PT eval at a future date as appropriate.    [name] [name] 10/05/2016, 10:35 AM

## 2016-10-05 NOTE — Progress Notes (Signed)
[place] for electrolyte management, constipation prevention.   Pharmacy consulted for above for 70 yo male ICU patient admitted with acute respiratory failure, ?meningitis/?PNA. Patient currently sedated with fentanyl drip.   Plan:     1. Electrolytes: Phos: 2.1. Will give KPhos 500 mg q4 hours x 4 doses. Will recheck electrolytes in am.           12/18:  Phos @ 0430 = 2.9   2. Constipation: will continue senna/docusate 1 tablet BID   Allergies  Allergen Reactions  . Acetaminophen-Codeine     Other reaction(s): Headache  . Rofecoxib Other (See Comments)    ulcers  . Tetracyclines & Related     Patient Measurements: Height: '5\' 11"'$  (180.3 cm) Weight: 279 lb 15.8 oz (127 kg) IBW/kg (Calculated) : 75.3 Adjusted Body Weight: 94kg  Ideal Body Weight: 75kg  Vital Signs: Temp: 100.8 F (38.2 C) (12/18 0510) Temp Source: Other (Comment) (12/17 2000) BP: 122/76 (12/18 0510) Pulse Rate: 86 (12/18 0510) Intake/Output from previous day: 12/17 0701 - 12/18 0700 In: 1380.1 [P.O.:220; I.V.:860.1; IV Piggyback:300] Out: 910 [Urine:910] Intake/Output from this shift: Total I/O In: 1248.2 [P.O.:220; I.V.:728.2; IV Piggyback:300] Out: 360 [Urine:360]  Labs:  Recent Labs  10/02/16 0816  10/02/16 1826 10/03/16 0433 10/04/16 0511 10/05/16 0431  WBC 14.2*  --  12.2* 7.5 4.1 6.1  HGB 12.5*  --  13.5 12.4* 11.7* 11.4*  HCT 36.8*  --  40.9 36.5* 34.7* 33.7*  PLT 145*  --  117* 113* 88* 105*  CREATININE 0.85  --  0.78 0.78 0.84 0.75  MG  --   < > 1.7 2.2 2.2 2.3  PHOS  --   --   --   --  2.1* 2.9  ALBUMIN 3.5  --  3.2*  --   --   --   PROT 7.0  --  6.9  --   --   --   AST 97*  --  122*  --   --   --   ALT 44  --  59  --   --   --   ALKPHOS 76  --  66  --   --   --   BILITOT 0.7  --  0.7  --   --   --   < > = values in this interval not displayed. Estimated Creatinine Clearance: 116.7 mL/min (by C-G formula based on SCr of 0.75  mg/dL).   Microbiology: Recent Results (from the past 720 hour(s))  Blood Culture (routine x 2)     Status: Abnormal (Preliminary result)   Collection Time: 10/02/16  8:16 AM  Result Value Ref Range Status   Specimen Description BLOOD RIGHT ARM  Final   Special Requests   Final    BOTTLES DRAWN AEROBIC AND ANAEROBIC  AER 8CC ANA 10CC   Culture  Setup Time   Final    GRAM POSITIVE COCCI IN BOTH AEROBIC AND ANAEROBIC BOTTLES CRITICAL VALUE NOTED.  VALUE IS CONSISTENT WITH PREVIOUSLY REPORTED AND CALLED VALUE.    Culture (A)  Final    STAPHYLOCOCCUS AUREUS SUSCEPTIBILITIES PERFORMED ON PREVIOUS CULTURE WITHIN THE LAST 5 DAYS. Performed at [facility]    Report Status PENDING  Incomplete  Blood Culture (routine x 2)     Status: Abnormal   Collection Time: 10/02/16  8:16 AM  Result Value Ref Range Status   Specimen Description BLOOD LEFT ANTECUBITAL  Final   Special  Requests   Final    BOTTLES DRAWN AEROBIC AND ANAEROBIC  AER 11CC ANA 12CC   Culture  Setup Time   Final    GRAM POSITIVE COCCI IN BOTH AEROBIC AND ANAEROBIC BOTTLES CRITICAL RESULT CALLED TO, READ BACK BY AND VERIFIED WITH: Carmin Richmond @ 1610 10/02/16 by [facility] Performed at [place] (A)  Final   Report Status 10/04/2016 FINAL  Final   Organism ID, Bacteria STAPHYLOCOCCUS AUREUS  Final      Susceptibility   Staphylococcus aureus - MIC*    CIPROFLOXACIN <=0.5 SENSITIVE Sensitive     ERYTHROMYCIN 0.5 SENSITIVE Sensitive     GENTAMICIN <=0.5 SENSITIVE Sensitive     OXACILLIN 0.5 SENSITIVE Sensitive     TETRACYCLINE <=1 SENSITIVE Sensitive     VANCOMYCIN 1 SENSITIVE Sensitive     TRIMETH/SULFA <=10 SENSITIVE Sensitive     CLINDAMYCIN <=0.25 SENSITIVE Sensitive     RIFAMPIN <=0.5 SENSITIVE Sensitive     Inducible Clindamycin NEGATIVE Sensitive     * STAPHYLOCOCCUS AUREUS  Urine culture     Status: None   Collection Time: 10/02/16  8:16 AM  Result Value Ref  Range Status   Specimen Description URINE, RANDOM  Final   Special Requests NONE  Final   Culture NO GROWTH Performed at [facility]   Final   Report Status 10/03/2016 FINAL  Final  Blood Culture ID Panel (Reflexed)     Status: Abnormal   Collection Time: 10/02/16  8:16 AM  Result Value Ref Range Status   Enterococcus species NOT DETECTED NOT DETECTED Final   Listeria monocytogenes NOT DETECTED NOT DETECTED Final   Staphylococcus species DETECTED (A) NOT DETECTED Final    Comment: CRITICAL RESULT CALLED TO, READ BACK BY AND VERIFIED WITH: Carmin Richmond @ 9604 10/02/16 by [place]    Staphylococcus aureus DETECTED (A) NOT DETECTED Final    Comment: CRITICAL RESULT CALLED TO, READ BACK BY AND VERIFIED WITH: Carmin Richmond @ 5409 10/02/16 by [facility]    Methicillin resistance NOT DETECTED NOT DETECTED Final   Streptococcus species NOT DETECTED NOT DETECTED Final   Streptococcus agalactiae NOT DETECTED NOT DETECTED Final   Streptococcus pneumoniae NOT DETECTED NOT DETECTED Final   Streptococcus pyogenes NOT DETECTED NOT DETECTED Final   Acinetobacter baumannii NOT DETECTED NOT DETECTED Final   Enterobacteriaceae species NOT DETECTED NOT DETECTED Final   Enterobacter cloacae complex NOT DETECTED NOT DETECTED Final   Escherichia coli NOT DETECTED NOT DETECTED Final   Klebsiella oxytoca NOT DETECTED NOT DETECTED Final   Klebsiella pneumoniae NOT DETECTED NOT DETECTED Final   Proteus species NOT DETECTED NOT DETECTED Final   Serratia marcescens NOT DETECTED NOT DETECTED Final   Haemophilus influenzae NOT DETECTED NOT DETECTED Final   Neisseria meningitidis NOT DETECTED NOT DETECTED Final   Pseudomonas aeruginosa NOT DETECTED NOT DETECTED Final   Candida albicans NOT DETECTED NOT DETECTED Final   Candida glabrata NOT DETECTED NOT DETECTED Final   Candida krusei NOT DETECTED NOT DETECTED Final   Candida parapsilosis NOT DETECTED NOT DETECTED Final   Candida tropicalis NOT DETECTED NOT  DETECTED Final  MRSA PCR Screening     Status: None   Collection Time: 10/02/16  2:40 PM  Result Value Ref Range Status   MRSA by PCR NEGATIVE NEGATIVE Final    Comment:        The GeneXpert MRSA Assay (FDA approved for NASAL specimens only), is one component of a comprehensive  MRSA colonization surveillance program. It is not intended to diagnose MRSA infection nor to guide or monitor treatment for MRSA infections.   CSF culture     Status: None (Preliminary result)   Collection Time: 10/02/16  3:13 PM  Result Value Ref Range Status   Specimen Description CSF  Final   Special Requests Normal  Final   Gram Stain   Final    RARE WBC SEEN RARE RED BLOOD CELLS NO ORGANISMS SEEN    Culture   Final    NO GROWTH 2 DAYS Performed at [facility]    Report Status PENDING  Incomplete  Culture, fungus without smear     Status: None (Preliminary result)   Collection Time: 10/02/16  3:14 PM  Result Value Ref Range Status   Specimen Description CSF  Final   Special Requests Normal  Final   Culture   Final    NO FUNGUS ISOLATED AFTER 2 DAYS Performed at [facility]    Report Status PENDING  Incomplete  Culture, expectorated sputum-assessment     Status: None   Collection Time: 10/02/16  6:18 PM  Result Value Ref Range Status   Specimen Description TRACHEAL ASPIRATE  Final   Special Requests NONE  Final   Sputum evaluation THIS SPECIMEN IS ACCEPTABLE FOR SPUTUM CULTURE  Final   Report Status 10/02/2016 FINAL  Final  Culture, respiratory (NON-Expectorated)     Status: None (Preliminary result)   Collection Time: 10/02/16  6:18 PM  Result Value Ref Range Status   Specimen Description TRACHEAL ASPIRATE  Final   Special Requests NONE Reflexed from [id number]  Final   Gram Stain   Final    ABUNDANT WBC PRESENT, PREDOMINANTLY MONONUCLEAR FEW GRAM NEGATIVE COCCI IN PAIRS RARE GRAM POSITIVE RODS    Culture   Final    CULTURE REINCUBATED FOR BETTER GROWTH Performed at  [facility]    Report Status PENDING  Incomplete    Medical History: Past Medical History:  Diagnosis Date  . Chronic pain   . Coronary artery disease   . Diabetes mellitus without complication ([place])   . DJD (degenerative joint disease)   . GERD (gastroesophageal reflux disease)   . Hyperlipemia   . Hypertension   . Insomnia   . Neuropathy [facility])    Pharmacy will continue to monitor and adjust per consult.   Cory Kitt D 10/05/2016,6:11 AM

## 2016-10-05 NOTE — Progress Notes (Signed)
Chaplain was making his rounds and visited with pt in [place]. Provided the ministry of prayer and spiritual presence.    10/05/16 1155  Clinical Encounter Type  Visited With Patient  Visit Type Initial;Spiritual support  Referral From Nurse  Spiritual Encounters  Spiritual Needs Prayer

## 2016-10-05 NOTE — Progress Notes (Signed)
Nutrition Follow-up  DOCUMENTATION CODES:   Obesity unspecified  INTERVENTION:  1. Diet advancement per MD/NP/PA  NUTRITION DIAGNOSIS:   Inadequate oral intake related to inability to eat as evidenced by NPO status. -resolving, pt diet to be advanced today per MD  GOAL:   Provide needs based on ASPEN/SCCM guidelines -will change goal with diet advancement  MONITOR:   I & O's, Labs, Weight trends, Vent status, TF tolerance  ASSESSMENT:   70 year old male with acute respiratory failure, possible secondary to pneumonia and/or ARDS, complicating acute mental status changes, possible meningitis.  Pt extubated Diet to be advanced today per MD - will monitor PO intake following. Needs re-estiamted Labs and medications reviewed: CBGs WNL, K 3.3, MVI w/ Iron, Senokot-S Precedex gtt  Diet Order:  Diet NPO time specified  Skin:  Wound (see comment) (Stg II PU to Buttocks)  Last BM:  PTA  Height:   Ht Readings from Last 1 Encounters:  10/02/16 '5\' 11"'$  (1.803 m)    Weight:   Wt Readings from Last 1 Encounters:  10/05/16 279 lb 15.8 oz (127 kg)    Ideal Body Weight:  78.18 kg  BMI:  Body mass index is 39.05 kg/m.  Estimated Nutritional Needs:   Kcal:  2000 calories  Protein:  126-152 gm  Fluid:  >/= 2L  EDUCATION NEEDS:   No education needs identified at this time  Satira Anis. Ellena Kamen, MS, RD LDN Inpatient Clinical Dietitian Pager [phone]

## 2016-10-05 NOTE — Progress Notes (Signed)
[place] INFECTIOUS DISEASE PROGRESS NOTE Date of Admission:  10/02/2016     ID: ROMNEY COMPEAN is a 70 y.o. male with sepsis, MSSA bacteremia Active Problems:   Sepsis ([place])   Pressure injury of skin   Subjective: Low grade fevers, extubated, some confusion, fell out of bed yesterday.  Off pressors but on precedex   ROS  Unable to obtain   Medications:  Antibiotics Given (last 72 hours)    Date/Time Action Medication Dose Rate   10/02/16 1733 Given   levofloxacin (LEVAQUIN) IVPB 750 mg 750 mg 100 mL/hr   10/02/16 1805 Given   ampicillin (OMNIPEN) 2 g in sodium chloride 0.9 % 50 mL IVPB 2 g 150 mL/hr   10/02/16 1805 Given   vancomycin (VANCOCIN) 1,750 mg in sodium chloride 0.9 % 500 mL IVPB 1,750 mg 250 mL/hr   10/02/16 2022 Given   acyclovir (ZOVIRAX) 755 mg in dextrose 5 % 150 mL IVPB 755 mg 165.1 mL/hr   10/02/16 2121 Given   ampicillin (OMNIPEN) 2 g in sodium chloride 0.9 % 50 mL IVPB 2 g 150 mL/hr   10/03/16 0430 Given   acyclovir (ZOVIRAX) 950 mg in dextrose 5 % 150 mL IVPB 950 mg 169 mL/hr   10/03/16 0737 Given   ceFAZolin (ANCEF) IVPB 2g/100 mL premix 2 g 200 mL/hr   10/03/16 1443 Given   ceFAZolin (ANCEF) IVPB 2g/100 mL premix 2 g 200 mL/hr   10/03/16 1443 Given   acyclovir (ZOVIRAX) 950 mg in dextrose 5 % 150 mL IVPB 950 mg 169 mL/hr   10/03/16 2159 Given   ceFAZolin (ANCEF) IVPB 2g/100 mL premix 2 g 200 mL/hr   10/03/16 2348 Given   acyclovir (ZOVIRAX) 950 mg in dextrose 5 % 150 mL IVPB 950 mg 169 mL/hr   10/04/16 0524 Given   ceFAZolin (ANCEF) IVPB 2g/100 mL premix 2 g 200 mL/hr   10/04/16 1443 Given   acyclovir (ZOVIRAX) 950 mg in dextrose 5 % 150 mL IVPB 950 mg 169 mL/hr   10/04/16 1456 Given   ceFAZolin (ANCEF) IVPB 2g/100 mL premix 2 g 200 mL/hr   10/04/16 1457 Given   acyclovir (ZOVIRAX) 950 mg in dextrose 5 % 150 mL IVPB 950 mg 169 mL/hr   10/04/16 2208 Given   ceFAZolin (ANCEF) IVPB 2g/100 mL premix 2 g 200 mL/hr   10/04/16 2330 Given   acyclovir (ZOVIRAX) 950 mg in dextrose 5 % 150 mL IVPB 950 mg 169 mL/hr   10/05/16 1540 Given   ceFAZolin (ANCEF) IVPB 2g/100 mL premix 2 g 200 mL/hr   10/05/16 0700 Given   acyclovir (ZOVIRAX) 950 mg in dextrose 5 % 150 mL IVPB 950 mg 169 mL/hr   10/05/16 1507 Given   ceFAZolin (ANCEF) IVPB 2g/100 mL premix 2 g 200 mL/hr   10/05/16 1508 Given   acyclovir (ZOVIRAX) 950 mg in dextrose 5 % 150 mL IVPB 950 mg 169 mL/hr     . acyclovir  950 mg Intravenous Q8H  . ALPRAZolam  0.5 mg Oral TID  .  ceFAZolin (ANCEF) IV  2 g Intravenous Q8H  . chlorhexidine gluconate (MEDLINE KIT)  15 mL Mouth Rinse BID  . ciprofloxacin  1 drop Both Eyes Q4H  . DULoxetine  60 mg Oral Daily  . enoxaparin (LOVENOX) injection  40 mg Subcutaneous Q24H  . famotidine  20 mg Oral BID  . gabapentin  600 mg Oral TID  . insulin aspart  2-6 Units Subcutaneous Q4H  . ipratropium-albuterol  3 mL Nebulization TID  . methadone  5 mg Oral TID  . multivitamins with iron  1 tablet Oral Daily  . pravastatin  40 mg Oral q1800  . senna-docusate  1 tablet Oral BID    Objective: Vital signs in last 24 hours: Temp:  [99.7 F (37.6 C)-101.1 F (38.4 C)] 100 F (37.8 C) (12/18 1100) Pulse Rate:  [53-144] 74 (12/18 1100) Resp:  [11-25] 14 (12/18 1100) BP: (94-142)/(51-89) 119/66 (12/18 1100) SpO2:  [90 %-100 %] 98 % (12/18 1331) Weight:  [127 kg (279 lb 15.8 oz)] 127 kg (279 lb 15.8 oz) (12/18 0400) Constitutional: awake, more alert.  HENT: anicteric, perrla Mouth/Throat: OP clear  Cardiovascular: Tachy, reg, 2/6 sm Pulmonary/Chest: Decent air movement  Abdominal: Soft. Bowel sounds are normal. He exhibits no distension. There is no tenderness.  Lymphadenopathy: He has no cervical adenopathy.  Neurological arousable, slightly confused Ext L knee prosthetic joint  Skin: bil Le abrasions   Lab Results  Recent Labs  10/04/16 0511 10/05/16 0431  WBC 4.1 6.1  HGB 11.7* 11.4*  HCT 34.7* 33.7*  NA 137 138  K 3.7 3.3*   CL 105 104  CO2 27 27  BUN 13 13  CREATININE 0.84 0.75    Results for orders placed or performed during the hospital encounter of 10/02/16  Blood Culture (routine x 2)     Status: Abnormal   Collection Time: 10/02/16  8:16 AM  Result Value Ref Range Status   Specimen Description BLOOD RIGHT ARM  Final   Special Requests   Final    BOTTLES DRAWN AEROBIC AND ANAEROBIC  AER 8CC ANA 10CC   Culture  Setup Time   Final    GRAM POSITIVE COCCI IN BOTH AEROBIC AND ANAEROBIC BOTTLES CRITICAL VALUE NOTED.  VALUE IS CONSISTENT WITH PREVIOUSLY REPORTED AND CALLED VALUE.    Culture (A)  Final    STAPHYLOCOCCUS AUREUS SUSCEPTIBILITIES PERFORMED ON PREVIOUS CULTURE WITHIN THE LAST 5 DAYS. Performed at [facility]    Report Status 10/05/2016 FINAL  Final  Blood Culture (routine x 2)     Status: Abnormal   Collection Time: 10/02/16  8:16 AM  Result Value Ref Range Status   Specimen Description BLOOD LEFT ANTECUBITAL  Final   Special Requests   Final    BOTTLES DRAWN AEROBIC AND ANAEROBIC  AER 11CC ANA 12CC   Culture  Setup Time   Final    GRAM POSITIVE COCCI IN BOTH AEROBIC AND ANAEROBIC BOTTLES CRITICAL RESULT CALLED TO, READ BACK BY AND VERIFIED WITH: Carmin Richmond @ 5945 10/02/16 by [facility] Performed at [place] (A)  Final   Report Status 10/04/2016 FINAL  Final   Organism ID, Bacteria STAPHYLOCOCCUS AUREUS  Final      Susceptibility   Staphylococcus aureus - MIC*    CIPROFLOXACIN <=0.5 SENSITIVE Sensitive     ERYTHROMYCIN 0.5 SENSITIVE Sensitive     GENTAMICIN <=0.5 SENSITIVE Sensitive     OXACILLIN 0.5 SENSITIVE Sensitive     TETRACYCLINE <=1 SENSITIVE Sensitive     VANCOMYCIN 1 SENSITIVE Sensitive     TRIMETH/SULFA <=10 SENSITIVE Sensitive     CLINDAMYCIN <=0.25 SENSITIVE Sensitive     RIFAMPIN <=0.5 SENSITIVE Sensitive     Inducible Clindamycin NEGATIVE Sensitive     * STAPHYLOCOCCUS AUREUS  Urine culture     Status: None    Collection Time: 10/02/16  8:16 AM  Result Value Ref Range Status  Specimen Description URINE, RANDOM  Final   Special Requests NONE  Final   Culture NO GROWTH Performed at [facility]   Final   Report Status 10/03/2016 FINAL  Final  Blood Culture ID Panel (Reflexed)     Status: Abnormal   Collection Time: 10/02/16  8:16 AM  Result Value Ref Range Status   Enterococcus species NOT DETECTED NOT DETECTED Final   Listeria monocytogenes NOT DETECTED NOT DETECTED Final   Staphylococcus species DETECTED (A) NOT DETECTED Final    Comment: CRITICAL RESULT CALLED TO, READ BACK BY AND VERIFIED WITH: Carmin Richmond @ 3007 10/02/16 by [place]    Staphylococcus aureus DETECTED (A) NOT DETECTED Final    Comment: CRITICAL RESULT CALLED TO, READ BACK BY AND VERIFIED WITH: Carmin Richmond @ 6226 10/02/16 by [facility]    Methicillin resistance NOT DETECTED NOT DETECTED Final   Streptococcus species NOT DETECTED NOT DETECTED Final   Streptococcus agalactiae NOT DETECTED NOT DETECTED Final   Streptococcus pneumoniae NOT DETECTED NOT DETECTED Final   Streptococcus pyogenes NOT DETECTED NOT DETECTED Final   Acinetobacter baumannii NOT DETECTED NOT DETECTED Final   Enterobacteriaceae species NOT DETECTED NOT DETECTED Final   Enterobacter cloacae complex NOT DETECTED NOT DETECTED Final   Escherichia coli NOT DETECTED NOT DETECTED Final   Klebsiella oxytoca NOT DETECTED NOT DETECTED Final   Klebsiella pneumoniae NOT DETECTED NOT DETECTED Final   Proteus species NOT DETECTED NOT DETECTED Final   Serratia marcescens NOT DETECTED NOT DETECTED Final   Haemophilus influenzae NOT DETECTED NOT DETECTED Final   Neisseria meningitidis NOT DETECTED NOT DETECTED Final   Pseudomonas aeruginosa NOT DETECTED NOT DETECTED Final   Candida albicans NOT DETECTED NOT DETECTED Final   Candida glabrata NOT DETECTED NOT DETECTED Final   Candida krusei NOT DETECTED NOT DETECTED Final   Candida parapsilosis NOT DETECTED NOT  DETECTED Final   Candida tropicalis NOT DETECTED NOT DETECTED Final  MRSA PCR Screening     Status: None   Collection Time: 10/02/16  2:40 PM  Result Value Ref Range Status   MRSA by PCR NEGATIVE NEGATIVE Final    Comment:        The GeneXpert MRSA Assay (FDA approved for NASAL specimens only), is one component of a comprehensive MRSA colonization surveillance program. It is not intended to diagnose MRSA infection nor to guide or monitor treatment for MRSA infections.   CSF culture     Status: None (Preliminary result)   Collection Time: 10/02/16  3:13 PM  Result Value Ref Range Status   Specimen Description CSF  Final   Special Requests Normal  Final   Gram Stain   Final    RARE WBC SEEN RARE RED BLOOD CELLS NO ORGANISMS SEEN    Culture   Final    NO GROWTH 3 DAYS Performed at [facility]    Report Status PENDING  Incomplete  Culture, fungus without smear     Status: None (Preliminary result)   Collection Time: 10/02/16  3:14 PM  Result Value Ref Range Status   Specimen Description CSF  Final   Special Requests Normal  Final   Culture   Final    NO FUNGUS ISOLATED AFTER 3 DAYS Performed at [facility]    Report Status PENDING  Incomplete  Culture, expectorated sputum-assessment     Status: None   Collection Time: 10/02/16  6:18 PM  Result Value Ref Range Status   Specimen Description TRACHEAL ASPIRATE  Final  Special Requests NONE  Final   Sputum evaluation THIS SPECIMEN IS ACCEPTABLE FOR SPUTUM CULTURE  Final   Report Status 10/02/2016 FINAL  Final  Culture, respiratory (NON-Expectorated)     Status: None   Collection Time: 10/02/16  6:18 PM  Result Value Ref Range Status   Specimen Description TRACHEAL ASPIRATE  Final   Special Requests NONE Reflexed from [id number]  Final   Gram Stain   Final    ABUNDANT WBC PRESENT, PREDOMINANTLY MONONUCLEAR FEW GRAM NEGATIVE COCCI IN PAIRS RARE GRAM POSITIVE RODS    Culture   Final    Consistent with  normal respiratory flora. Performed at [facility]    Report Status 10/05/2016 FINAL  Final    Studies/Results: Dg Chest 1 View  Result Date: 10/04/2016 CLINICAL DATA:  Dyspnea. EXAM: CHEST 1 VIEW COMPARISON:  10/03/2016. FINDINGS: Endotracheal tube in satisfactory position. Nasogastric tube extending into the stomach. Prosthetic heart valve. Cervical spine fixation hardware and thoracic spine degenerative changes. Stable mildly enlarged cardiac silhouette and prominence of the interstitial markings. Left basilar opacity with a more patchy appearance today. IMPRESSION: Mildly increased left basilar pneumonia or patchy atelectasis. Stable mild cardiomegaly and chronic interstitial lung disease. Electronically Signed   By: Claudie Revering M.D.   On: 10/04/2016 07:36    Assessment/Plan: SAJID RUPPERT is a 70 y.o. male admitted with sepsis and confusion. His LP is relatively unrevealing, CT chest with possible aspiration. BCX + GPC with MSSA Initially received empiric treatment for meningiits but CSF not consistent with this. I suspect he has staph bacteremia from the some abrasions on his legs after his fall Echo shows bioprosthetic valve, no veg Repeat bcx not done Recommendations Cont  Ancef - will need 2 week minimum course Repeat bcx to document clearance. Once bcx neg for 48 hours can place PICC.  Would suggest TEE once he is a little more stable to help decide duration of ancef. I discussed this with patient's wife. [phone]  Thank you very much for the consult. Will follow with you.  Shantanique Hodo P   10/05/2016, 4:12 PM

## 2016-10-05 NOTE — Progress Notes (Signed)
[facility] H&P    ASSESSMENT/PLAN   70 year old male with acute respiratory failure, possible secondary to pneumonia and/or ARDS, on minimal oxygen now with agitation and delerium with gram pos bacteremia  PULMONARY A:Acute hypoxic respiratory failure, etiology uncertain. -Bibasilar atelectasis. -Pneumonia. CXR reviewed, mild changes of bronchitis, extubated P:   -We'll treat empirically with antibiotics.   CARDIOVASCULAR A: -Afib with RVR, has been on amiodarone for several days without control.  -Increased troponins. -DC amio, start cardizem infusion.    INFECTIOUS A:  Pneumonia. -Possible meningitis. P:   Continue empiric antibiotics. -Check cultures. -Continue antibiotics per ID  Micro/culture results:  BCx2 12/15--gram-positive cocci, PCR positive for staph aureus. UC 12/15 . Pending Sputum 12/15 --pending Flu negative.  CSF 12/15: Pending  Antibiotics: Zosyn 12/15>> 12/15 Levaquin 12/15>>12/15 Vancomycin 12/15>> Acyclovir 12/15>> Cefazolin 12/15>> Procalcitonin; 6.55>>4.36  ENDOCRINE A:  SSI.   P:     NEUROLOGIC A:  Acute metabolic encephalopathy. -wean off precedex, try xanax  MAJOR EVENTS/TEST RESULTS: LP 12/15;   Best Practices  DVT Prophylaxis: enoxaparin GI Prophylaxis: famotidine.    ---------------------------------------  ---------------------------------------   Name: [name] MRN: [id number] DOB: 11-Aug-1946    ADMISSION DATE:  10/02/2016   Subjective: Extubated, on minimal oxygen On precedex for delerium  Prior to Admission medications   Medication Sig Start Date End Date Taking? Authorizing Provider  aspirin EC 81 MG tablet Take 1 tablet by mouth daily.   Yes Historical Provider, MD  Cyanocobalamin (RA VITAMIN B-12 TR) 1000 MCG TBCR Take 1 tablet by mouth daily.   Yes Historical Provider, MD  cyclobenzaprine (FLEXERIL) 10 MG tablet Take 1 tablet by mouth daily.   Yes Historical  Provider, MD  DULoxetine (CYMBALTA) 60 MG capsule Take 1 capsule by mouth at bedtime. 07/29/15  Yes Historical Provider, MD  furosemide (LASIX) 40 MG tablet Take 1 tablet by mouth 2 (two) times daily. 03/19/16  Yes Historical Provider, MD  gabapentin (NEURONTIN) 600 MG tablet Take 1 tablet by mouth 3 (three) times daily. 11/21/12  Yes Historical Provider, MD  lisinopril (PRINIVIL,ZESTRIL) 10 MG tablet Take 1 tablet by mouth daily. 02/10/16  Yes Historical Provider, MD  Loratadine 10 MG CAPS Take 1 capsule by mouth daily.   Yes Historical Provider, MD  meclizine (ANTIVERT) 25 MG tablet Take 1 tablet by mouth 3 (three) times daily. 03/19/16  Yes Historical Provider, MD  metFORMIN (GLUCOPHAGE) 1000 MG tablet Take 1 tablet by mouth 2 (two) times daily. With meals. 03/19/16  Yes Historical Provider, MD  Multiple Vitamin (MULTI-VITAMINS) TABS Take 1 tablet by mouth daily.   Yes Historical Provider, MD  omeprazole (PRILOSEC) 20 MG capsule Take 20 mg by mouth daily.   Yes Historical Provider, MD  Oxcarbazepine (TRILEPTAL) 300 MG tablet Take 1 tablet by mouth 2 (two) times daily. 12/05/12  Yes Historical Provider, MD  pravastatin (PRAVACHOL) 40 MG tablet Take 1 tablet by mouth at bedtime. 02/10/16  Yes Historical Provider, MD  methadone (DOLOPHINE) 5 MG tablet Take 1 tablet by mouth 3 (three) times daily. 09/02/16   Historical Provider, MD  metoprolol tartrate (LOPRESSOR) 25 MG tablet Take 1 tablet by mouth 2 (two) times daily. 07/20/16   Historical Provider, MD   Allergies  Allergen Reactions  . Acetaminophen-Codeine     Other reaction(s): Headache  . Rofecoxib Other (See Comments)    ulcers  . Tetracyclines & Related    REVIEW OF SYSTEMS:   Could not be obtained due to critical illness.  VITAL SIGNS: Temp:  [99.7 F (37.6 C)-101.1 F (38.4 C)] 100.6 F (38.1 C) (12/18 0700) Pulse Rate:  [53-144] 81 (12/18 0700) Resp:  [11-25] 14 (12/18 0700) BP: (94-195)/(51-177) 108/69 (12/18 0700) SpO2:  [90 %-100  %] 99 % (12/18 0744) FiO2 (%):  [35 %] 35 % (12/17 1109) Weight:  [279 lb 15.8 oz (127 kg)] 279 lb 15.8 oz (127 kg) (12/18 0400) HEMODYNAMICS:   VENTILATOR SETTINGS: Vent Mode: PSV;CPAP FiO2 (%):  [35 %] 35 % Set Rate:  [12 bmp] 12 bmp Vt Set:  [500 mL] 500 mL PEEP:  [5 cmH20-8 cmH20] 5 cmH20 Pressure Support:  [5 cmH20] 5 cmH20 INTAKE / OUTPUT:  Intake/Output Summary (Last 24 hours) at 10/05/16 0753 Last data filed at 10/05/16 0160  Gross per 24 hour  Intake          1817.34 ml  Output             1110 ml  Net           707.34 ml    Physical Examination:   VS: BP 108/69   Pulse 81   Temp (!) 100.6 F (38.1 C)   Resp 14   Ht '5\' 11"'$  (1.803 m)   Wt 279 lb 15.8 oz (127 kg)   SpO2 99%   BMI 39.05 kg/m   General Appearance: minimal distress  Neuro:without focal findings, mental status reduced.  HEENT: PERRLA, EOM intact, no ptosis. Pulmonary: normal breath sounds. CardiovascularNormal S1,S2.  No m/r/g.    Abdomen: Benign, Soft, non-tender. Renal:  No costovertebral tenderness  GU:  Not performed at this time. Endoc: No evident thyromegaly, no signs of acromegaly. Skin:   warm, no rashes, no ecchymosis  Extremities: normal, no cyanosis, clubbing, no edema, warm with normal capillary refill.    LABS: Reviewed   LABORATORY PANEL:   CBC  Recent Labs Lab 10/05/16 0431  WBC 6.1  HGB 11.4*  HCT 33.7*  PLT 105*    Chemistries   Recent Labs Lab 10/02/16 1826  10/05/16 0431  NA 131*  < > 138  K 3.2*  < > 3.3*  CL 100*  < > 104  CO2 20*  < > 27  GLUCOSE 221*  < > 131*  BUN 13  < > 13  CREATININE 0.78  < > 0.75  CALCIUM 7.5*  < > 8.0*  MG 1.7  < > 2.3  PHOS  --   < > 2.9  AST 122*  --   --   ALT 59  --   --   ALKPHOS 66  --   --   BILITOT 0.7  --   --   < > = values in this interval not displayed.   Recent Labs Lab 10/04/16 1056 10/04/16 1644 10/04/16 2007 10/04/16 2332 10/05/16 0342 10/05/16 0743  GLUCAP 140* 149* 150* 193* 137* 136*     Recent Labs Lab 10/03/16 1615 10/04/16 0500 10/04/16 1218  PHART 7.34* 7.37 7.35  PCO2ART 50* 49* 53*  PO2ART 95 138* 95    Recent Labs Lab 10/02/16 0816 10/02/16 1826  AST 97* 122*  ALT 44 59  ALKPHOS 76 66  BILITOT 0.7 0.7  ALBUMIN 3.5 3.2*    Cardiac Enzymes  Recent Labs Lab 10/02/16 0816  TROPONINI 0.06*    RADIOLOGY:  Dg Chest 1 View  Result Date: 10/04/2016 CLINICAL DATA:  Dyspnea. EXAM: CHEST 1 VIEW COMPARISON:  10/03/2016. FINDINGS: Endotracheal tube in satisfactory position. Nasogastric tube extending  into the stomach. Prosthetic heart valve. Cervical spine fixation hardware and thoracic spine degenerative changes. Stable mildly enlarged cardiac silhouette and prominence of the interstitial markings. Left basilar opacity with a more patchy appearance today. IMPRESSION: Mildly increased left basilar pneumonia or patchy atelectasis. Stable mild cardiomegaly and chronic interstitial lung disease. Electronically Signed   By: Claudie Revering M.D.   On: 10/04/2016 07:36       I have personally obtained a history, examined the patient, evaluated Pertinent laboratory and [employer]/imaging results, and  formulated the assessment and plan   The Patient requires high complexity decision making for assessment and support, frequent evaluation and titration of therapies, application of advanced monitoring technologies and extensive interpretation of multiple databases. Critical Care Time devoted to patient care services described in this note is 35 minutes.   Overall, patient is critically ill, prognosis is guarded.   Follow up ID and Neuro recs   Corrin Parker, M.D.  [facility]  Medical Director [place] Director [facility] Cardio-Pulmonary Department

## 2016-10-06 DIAGNOSIS — G9340 Encephalopathy, unspecified: Secondary | ICD-10-CM | POA: Diagnosis not present

## 2016-10-06 DIAGNOSIS — A4901 Methicillin susceptible Staphylococcus aureus infection, unspecified site: Secondary | ICD-10-CM

## 2016-10-06 DIAGNOSIS — J9601 Acute respiratory failure with hypoxia: Secondary | ICD-10-CM | POA: Diagnosis not present

## 2016-10-06 LAB — CSF CULTURE W GRAM STAIN: Special Requests: NORMAL

## 2016-10-06 LAB — BASIC METABOLIC PANEL
ANION GAP: 8 (ref 5–15)
BUN: 13 mg/dL (ref 6–20)
CALCIUM: 8.1 mg/dL — AB (ref 8.9–10.3)
CHLORIDE: 103 mmol/L (ref 101–111)
CO2: 25 mmol/L (ref 22–32)
Creatinine, Ser: 0.72 mg/dL (ref 0.61–1.24)
GFR calc non Af Amer: >60 mL/min (ref >60)
Glucose, Bld: 108 mg/dL — ABNORMAL HIGH (ref 65–99)
Potassium: 3.3 mmol/L — ABNORMAL LOW (ref 3.5–5.1)
Sodium: 136 mmol/L (ref 135–145)

## 2016-10-06 LAB — GLUCOSE, CAPILLARY
GLUCOSE-CAPILLARY: 109 mg/dL — AB (ref 65–99)
GLUCOSE-CAPILLARY: 127 mg/dL — AB (ref 65–99)
GLUCOSE-CAPILLARY: 112 mg/dL — AB (ref 65–99)
Glucose-Capillary: 102 mg/dL — ABNORMAL HIGH (ref 65–99)
Glucose-Capillary: 144 mg/dL — ABNORMAL HIGH (ref 65–99)

## 2016-10-06 LAB — PHOSPHORUS: Phosphorus: 3.1 mg/dL (ref 2.5–4.6)

## 2016-10-06 LAB — CSF CULTURE: CULTURE: NO GROWTH

## 2016-10-06 LAB — MAGNESIUM: MAGNESIUM: 2.2 mg/dL (ref 1.7–2.4)

## 2016-10-06 MED ORDER — INSULIN ASPART 100 UNIT/ML ~~LOC~~ SOLN
0.0000 [IU] | Freq: Three times a day (TID) | SUBCUTANEOUS | Status: DC
  Administered 2016-10-06: 2 [IU] via SUBCUTANEOUS
  Administered 2016-10-07: 5 [IU] via SUBCUTANEOUS
  Administered 2016-10-07 (×2): 2 [IU] via SUBCUTANEOUS
  Administered 2016-10-09: 3 [IU] via SUBCUTANEOUS
  Administered 2016-10-09 – 2016-10-10 (×4): 2 [IU] via SUBCUTANEOUS
  Filled 2016-10-06 (×3): qty 2
  Filled 2016-10-06: qty 3
  Filled 2016-10-06: qty 2
  Filled 2016-10-06: qty 5
  Filled 2016-10-06 (×3): qty 2

## 2016-10-06 MED ORDER — INSULIN ASPART 100 UNIT/ML ~~LOC~~ SOLN: 0.0000 [IU] | Freq: Every day | SUBCUTANEOUS | Status: DC

## 2016-10-06 MED ORDER — POTASSIUM CHLORIDE CRYS ER 20 MEQ PO TBCR
40.0000 meq | EXTENDED_RELEASE_TABLET | Freq: Two times a day (BID) | ORAL | Status: DC
  Administered 2016-10-06: 40 meq via ORAL
  Filled 2016-10-06: qty 2

## 2016-10-06 MED ORDER — RIVAROXABAN (XARELTO) EDUCATION KIT FOR AFIB PATIENTS
PACK | Freq: Once | Status: AC
  Administered 2016-10-06: 17:00:00
  Filled 2016-10-06: qty 1

## 2016-10-06 MED ORDER — RIVAROXABAN 20 MG PO TABS
20.0000 mg | ORAL_TABLET | Freq: Every day | ORAL | Status: DC
  Administered 2016-10-06 – 2016-10-10 (×5): 20 mg via ORAL
  Filled 2016-10-06 (×5): qty 1

## 2016-10-06 MED ORDER — AMIODARONE HCL 200 MG PO TABS
200.0000 mg | ORAL_TABLET | Freq: Every day | ORAL | Status: DC
  Administered 2016-10-06 – 2016-10-10 (×5): 200 mg via ORAL
  Filled 2016-10-06 (×5): qty 1

## 2016-10-06 MED ORDER — OXCARBAZEPINE 300 MG PO TABS
300.0000 mg | ORAL_TABLET | Freq: Two times a day (BID) | ORAL | Status: DC
  Administered 2016-10-06 – 2016-10-10 (×9): 300 mg via ORAL
  Filled 2016-10-06 (×10): qty 1

## 2016-10-06 MED ORDER — POTASSIUM CHLORIDE 20 MEQ PO PACK
40.0000 meq | PACK | Freq: Once | ORAL | Status: AC
  Administered 2016-10-06: 40 meq via ORAL
  Filled 2016-10-06: qty 2

## 2016-10-06 NOTE — Progress Notes (Signed)
Per Dr. Mortimer Fries patient can be transferred to telemetry and d/c foley. Orders placed. Wilnette Kales

## 2016-10-06 NOTE — Progress Notes (Signed)
[facility] INFECTI[facility] PROGRESS NOTE Date of Admission:  10/02/2016     ID: STEFON RAMTHUN is a 70 y.o. male with sepsis, MSSA bacteremia Active Problems:   Sepsis ([place])   Pressure injury of skin   Subjective: Low grade fevers, extubated, some confusion, fell out of bed yesterday.  Off pressors but on precedex   ROS  Unable to obtain   Medications:  Antibiotics Given (last 72 hours)    Date/Time Action Medication Dose Rate   10/03/16 2159 Given   ceFAZolin (ANCEF) IVPB 2g/100 mL premix 2 g 200 mL/hr   10/03/16 2348 Given   acyclovir (ZOVIRAX) 950 mg in dextrose 5 % 150 mL IVPB 950 mg 169 mL/hr   10/04/16 0524 Given   ceFAZolin (ANCEF) IVPB 2g/100 mL premix 2 g 200 mL/hr   10/04/16 0616 Given   acyclovir (ZOVIRAX) 950 mg in dextrose 5 % 150 mL IVPB 950 mg 169 mL/hr   10/04/16 1456 Given   ceFAZolin (ANCEF) IVPB 2g/100 mL premix 2 g 200 mL/hr   10/04/16 1457 Given   acyclovir (ZOVIRAX) 950 mg in dextrose 5 % 150 mL IVPB 950 mg 169 mL/hr   10/04/16 2208 Given   ceFAZolin (ANCEF) IVPB 2g/100 mL premix 2 g 200 mL/hr   10/04/16 2330 Given   acyclovir (ZOVIRAX) 950 mg in dextrose 5 % 150 mL IVPB 950 mg 169 mL/hr   10/05/16 1540 Given   ceFAZolin (ANCEF) IVPB 2g/100 mL premix 2 g 200 mL/hr   10/05/16 0700 Given   acyclovir (ZOVIRAX) 950 mg in dextrose 5 % 150 mL IVPB 950 mg 169 mL/hr   10/05/16 1507 Given   ceFAZolin (ANCEF) IVPB 2g/100 mL premix 2 g 200 mL/hr   10/05/16 1508 Given   acyclovir (ZOVIRAX) 950 mg in dextrose 5 % 150 mL IVPB 950 mg 169 mL/hr   10/05/16 2140 Given   ceFAZolin (ANCEF) IVPB 2g/100 mL premix 2 g 200 mL/hr   10/06/16 0524 Given   ceFAZolin (ANCEF) IVPB 2g/100 mL premix 2 g 200 mL/hr   10/06/16 1415 Given   ceFAZolin (ANCEF) IVPB 2g/100 mL premix 2 g 200 mL/hr     . ALPRAZolam  0.5 mg Oral TID  . amiodarone  200 mg Oral Daily  .  ceFAZolin (ANCEF) IV  2 g Intravenous Q8H  . chlorhexidine gluconate (MEDLINE KIT)  15 mL Mouth Rinse BID   . ciprofloxacin  1 drop Both Eyes Q4H  . DULoxetine  60 mg Oral Daily  . famotidine  20 mg Oral BID  . gabapentin  600 mg Oral TID  . insulin aspart  0-15 Units Subcutaneous TID WC  . insulin aspart  0-5 Units Subcutaneous QHS  . ipratropium-albuterol  3 mL Nebulization TID  . methadone  5 mg Oral TID  . multivitamins with iron  1 tablet Oral Daily  . OXcarbazepine  300 mg Oral BID  . potassium chloride  40 mEq Oral BID  . pravastatin  40 mg Oral q1800  . rivaroxaban   Does not apply Once  . rivaroxaban  20 mg Oral Q supper  . senna-docusate  1 tablet Oral BID    Objective: Vital signs in last 24 hours: Temp:  [97.9 F (36.6 C)-100.8 F (38.2 C)] 99 F (37.2 C) (12/19 1544) Pulse Rate:  [57-135] 80 (12/19 1544) Resp:  [14-22] 18 (12/19 1544) BP: (90-154)/(53-98) 136/75 (12/19 1544) SpO2:  [93 %-100 %] 95 % (12/19 1544) FiO2 (%):  [21 %] 21 % (  12/19 1331) Weight:  [129.6 kg (285 lb 11.5 oz)] 129.6 kg (285 lb 11.5 oz) (12/19 0500) Constitutional: awake, more alert.  HENT: anicteric, perrla Mouth/Throat: OP clear  Cardiovascular: Tachy, reg, 2/6 sm Pulmonary/Chest: Decent air movement  Abdominal: Soft. Bowel sounds are normal. He exhibits no distension. There is no tenderness.  Lymphadenopathy: He has no cervical adenopathy.  Neurological arousable, slightly confused Ext L knee prosthetic joint  Skin: bil Le abrasions   Lab Results  Recent Labs  10/04/16 0511 10/05/16 0431 10/06/16 0539  WBC 4.1 6.1  --   HGB 11.7* 11.4*  --   HCT 34.7* 33.7*  --   NA 137 138 136  K 3.7 3.3* 3.3*  CL 105 104 103  CO2 _0 BUN _1 CREATININE 0.84 0.75 0.72    Results for orders placed or performed during the hospital encounter of 10/02/16  Blood Culture (routine x 2)     Status: Abnormal   Collection Time: 10/02/16  8:16 AM  Result Value Ref Range Status   Specimen Description BLOOD RIGHT ARM  Final   Special Requests   Final    BOTTLES DRAWN AEROBIC AND  ANAEROBIC  AER 8CC ANA 10CC   Culture  Setup Time   Final    GRAM POSITIVE COCCI IN BOTH AEROBIC AND ANAEROBIC BOTTLES CRITICAL VALUE NOTED.  VALUE IS CONSISTENT WITH PREVIOUSLY REPORTED AND CALLED VALUE.    Culture (A)  Final    STAPHYLOCOCCUS AUREUS SUSCEPTIBILITIES PERFORMED ON PREVIOUS CULTURE WITHIN THE LAST 5 DAYS. Performed at [facility]    Report Status 10/05/2016 FINAL  Final  Blood Culture (routine x 2)     Status: Abnormal   Collection Time: 10/02/16  8:16 AM  Result Value Ref Range Status   Specimen Description BLOOD LEFT ANTECUBITAL  Final   Special Requests   Final    BOTTLES DRAWN AEROBIC AND ANAEROBIC  AER 11CC ANA 12CC   Culture  Setup Time   Final    GRAM POSITIVE COCCI IN BOTH AEROBIC AND ANAEROBIC BOTTLES CRITICAL RESULT CALLED TO, READ BACK BY AND VERIFIED WITH: Carmin Richmond @ [phone] 10/02/16 by [facility] Performed at [place] (A)  Final   Report Status 10/04/2016 FINAL  Final   Organism ID, Bacteria STAPHYLOCOCCUS AUREUS  Final      Susceptibility   Staphylococcus aureus - MIC*    CIPROFLOXACIN <=0.5 SENSITIVE Sensitive     ERYTHROMYCIN 0.5 SENSITIVE Sensitive     GENTAMICIN <=0.5 SENSITIVE Sensitive     OXACILLIN 0.5 SENSITIVE Sensitive     TETRACYCLINE <=1 SENSITIVE Sensitive     VANCOMYCIN 1 SENSITIVE Sensitive     TRIMETH/SULFA <=10 SENSITIVE Sensitive     CLINDAMYCIN <=0.25 SENSITIVE Sensitive     RIFAMPIN <=0.5 SENSITIVE Sensitive     Inducible Clindamycin NEGATIVE Sensitive     * STAPHYLOCOCCUS AUREUS  Urine culture     Status: None   Collection Time: 10/02/16  8:16 AM  Result Value Ref Range Status   Specimen Description URINE, RANDOM  Final   Special Requests NONE  Final   Culture NO GROWTH Performed at [facility]   Final   Report Status 10/03/2016 FINAL  Final  Blood Culture ID Panel (Reflexed)     Status: Abnormal   Collection Time: 10/02/16  8:16 AM  Result Value Ref Range  Status   Enterococcus species NOT DETECTED NOT DETECTED Final  Listeria monocytogenes NOT DETECTED NOT DETECTED Final   Staphylococcus species DETECTED (A) NOT DETECTED Final    Comment: CRITICAL RESULT CALLED TO, READ BACK BY AND VERIFIED WITH: Carmin Richmond @ [phone] 10/02/16 by [place]    Staphylococcus aureus DETECTED (A) NOT DETECTED Final    Comment: CRITICAL RESULT CALLED TO, READ BACK BY AND VERIFIED WITH: Carmin Richmond @ [phone] 10/02/16 by [facility]    Methicillin resistance NOT DETECTED NOT DETECTED Final   Streptococcus species NOT DETECTED NOT DETECTED Final   Streptococcus agalactiae NOT DETECTED NOT DETECTED Final   Streptococcus pneumoniae NOT DETECTED NOT DETECTED Final   Streptococcus pyogenes NOT DETECTED NOT DETECTED Final   Acinetobacter baumannii NOT DETECTED NOT DETECTED Final   Enterobacteriaceae species NOT DETECTED NOT DETECTED Final   Enterobacter cloacae complex NOT DETECTED NOT DETECTED Final   Escherichia coli NOT DETECTED NOT DETECTED Final   Klebsiella oxytoca NOT DETECTED NOT DETECTED Final   Klebsiella pneumoniae NOT DETECTED NOT DETECTED Final   Proteus species NOT DETECTED NOT DETECTED Final   Serratia marcescens NOT DETECTED NOT DETECTED Final   Haemophilus influenzae NOT DETECTED NOT DETECTED Final   Neisseria meningitidis NOT DETECTED NOT DETECTED Final   Pseudomonas aeruginosa NOT DETECTED NOT DETECTED Final   Candida albicans NOT DETECTED NOT DETECTED Final   Candida glabrata NOT DETECTED NOT DETECTED Final   Candida krusei NOT DETECTED NOT DETECTED Final   Candida parapsilosis NOT DETECTED NOT DETECTED Final   Candida tropicalis NOT DETECTED NOT DETECTED Final  MRSA PCR Screening     Status: None   Collection Time: 10/02/16  2:40 PM  Result Value Ref Range Status   MRSA by PCR NEGATIVE NEGATIVE Final    Comment:        The GeneXpert MRSA Assay (FDA approved for NASAL specimens only), is one component of a comprehensive MRSA  colonization surveillance program. It is not intended to diagnose MRSA infection nor to guide or monitor treatment for MRSA infections.   CSF culture     Status: None   Collection Time: 10/02/16  3:13 PM  Result Value Ref Range Status   Specimen Description CSF  Final   Special Requests Normal  Final   Gram Stain   Final    RARE WBC SEEN RARE RED BLOOD CELLS NO ORGANISMS SEEN    Culture   Final    NO GROWTH 3 DAYS Performed at [facility]    Report Status 10/06/2016 FINAL  Final  Culture, fungus without smear     Status: None (Preliminary result)   Collection Time: 10/02/16  3:14 PM  Result Value Ref Range Status   Specimen Description CSF  Final   Special Requests Normal  Final   Culture   Final    NO FUNGUS ISOLATED AFTER 3 DAYS Performed at [facility]    Report Status PENDING  Incomplete  Fungus Culture With Stain     Status: None (Preliminary result)   Collection Time: 10/02/16  5:07 PM  Result Value Ref Range Status   Fungus Stain Final report  Final    Comment: (NOTE) Performed At: [facility], [place] [id number] Lindon Romp MD FO:[id number]    Fungus (Mycology) Culture PENDING  Incomplete   Fungal Source CSF  Final  Fungus Culture Result     Status: None   Collection Time: 10/02/16  5:07 PM  Result Value Ref Range Status   Result 1 Comment  Final  Comment: (NOTE) KOH/Calcofluor preparation:  no fungus observed. Performed At: [facility] [place], [place] [id number] Lindon Romp MD VL:[id number]   Culture, expectorated sputum-assessment     Status: None   Collection Time: 10/02/16  6:18 PM  Result Value Ref Range Status   Specimen Description TRACHEAL ASPIRATE  Final   Special Requests NONE  Final   Sputum evaluation THIS SPECIMEN IS ACCEPTABLE FOR SPUTUM CULTURE  Final   Report Status 10/02/2016 FINAL  Final  Culture, respiratory (NON-Expectorated)     Status: None    Collection Time: 10/02/16  6:18 PM  Result Value Ref Range Status   Specimen Description TRACHEAL ASPIRATE  Final   Special Requests NONE Reflexed from [id number]  Final   Gram Stain   Final    ABUNDANT WBC PRESENT, PREDOMINANTLY MONONUCLEAR FEW GRAM NEGATIVE COCCI IN PAIRS RARE GRAM POSITIVE RODS    Culture   Final    Consistent with normal respiratory flora. Performed at [facility]    Report Status 10/05/2016 FINAL  Final  Culture, blood (single) w Reflex to ID Panel     Status: None (Preliminary result)   Collection Time: 10/05/16  5:23 PM  Result Value Ref Range Status   Specimen Description BLOOD LEFT WRIST  Final   Special Requests   Final    BOTTLES DRAWN AEROBIC AND ANAEROBIC AER 11ML ANA 6ML   Culture NO GROWTH < 24 HOURS  Final   Report Status PENDING  Incomplete    Studies/Results: No results found.  Assessment/Plan: CEZAR MISIASZEK is a 70 y.o. male admitted with sepsis and confusion. His LP is relatively unrevealing, CT chest with possible aspiration. BCX + GPC with MSSA Initially received empiric treatment for meningiits but CSF not consistent with this. I suspect he has staph bacteremia from the some abrasions on his legs after his fall Echo shows bioprosthetic valve, no veg Repeat bcx pending 12/18.  Recommendations Cont  Ancef - will need 2 week minimum course If fu bcx neg tomorrow can place PICC.  Would suggest TEE once he is a little more stable to help decide duration of ancef. I will consult cards for this and they can decide when stable enough to do it. - patient's wife. [phone]  Thank you very much for the consult. Will follow with you.  Fallou Hulbert P   10/06/2016, 4:05 PM

## 2016-10-06 NOTE — Care Management (Signed)
ID following for potential long term antibiotic need. [facility]. PT following- patient was max assist per staff and may require SNF. CSW updated.

## 2016-10-06 NOTE — Care Management (Signed)
Patient transferred to 2A from ICU.  Physical therapy is recommending skilled nursing placement.  Patient is going to require at least a 2 week course of Ancef intravenously.  CSW aware of recommendation

## 2016-10-06 NOTE — Evaluation (Signed)
Physical Therapy Evaluation Patient Details Name: [name] MRN: [id number] DOB: [birth year] Today's Date: 10/06/2016   History of Present Illness  Pt is a 70 year old male with acute respiratory failure, possible secondary to pneumonia and/or ARDS, on minimal oxygen now with agitation and delerium with gram pos bacteremia-MSSA  Clinical Impression  Pt presents with deficits in strength, transfers, mobility, gait, balance, and activity tolerance.  Pt required max A with bed mobility and was unable to clear surface of bed during sit to stand transfer attempt.  Pt on 2LO2/min during session with SpO2 and HR fluctuating throughout but generally SpO2 remained in the mid 90's and HR upper 90's to 110 bpm.  Pt presents with significant functional weakness and will likely benefit from SNF upon discharge from acute care to address above deficits for an eventual safe return to prior living situation.      Follow Up Recommendations SNF    Equipment Recommendations  None recommended by PT    Recommendations for Other Services       Precautions / Restrictions Precautions Precautions: Fall Restrictions Weight Bearing Restrictions: No      Mobility  Bed Mobility Overal bed mobility: Needs Assistance Bed Mobility: Supine to Sit;Sit to Supine     Supine to sit: Max assist Sit to supine: Max assist   General bed mobility comments: Max verbal and tactile cues for sequencing  Transfers Overall transfer level: Needs assistance Equipment used: Rolling walker (2 wheeled) Transfers: Sit to/from Stand Sit to Stand: +2 physical assistance         General transfer comment: Pt able to unweight bottom from EOB but was not able to clear bed surface  Ambulation/Gait             General Gait Details: Unable  Stairs            Wheelchair Mobility    Modified Rankin (Stroke Patients Only)       Balance Overall balance assessment: Needs assistance Sitting-balance  support: Bilateral upper extremity supported Sitting balance-Leahy Scale: Fair         Standing balance comment: Unable to stand                             Pertinent Vitals/Pain Pain Assessment: No/denies pain    Home Living Family/patient expects to be discharged to:: Private residence Living Arrangements: Spouse/significant other Available Help at Discharge: Other (Comment) (No assistance available at home per spouse) Type of Home: House Home Access: Stairs to enter Entrance Stairs-Rails: Right Entrance Stairs-Number of Steps: 4 Home Layout: One level        Prior Function Level of Independence: Independent with assistive device(s)         Comments: PLOF per spouse: Mod I with amb with SPC community distances, Ind with ADLs, 10+ falls in last year     Hand Dominance   Dominant Hand: Right    Extremity/Trunk Assessment        Lower Extremity Assessment Lower Extremity Assessment: Generalized weakness       Communication   Communication: Other (comment) (Pt remains somewhat disoriented)  Cognition Arousal/Alertness: Lethargic Behavior During Therapy: WFL for tasks assessed/performed Overall Cognitive Status: Impaired/Different from baseline Area of Impairment: Orientation;Attention;Following commands Orientation Level: Person;Place Current Attention Level: Alternating   Following Commands: Follows one step commands inconsistently Safety/Judgement: Decreased awareness of safety          General Comments  Exercises Total Joint Exercises Ankle Circles/Pumps: Strengthening;Both;10 reps Quad Sets: AROM;Both;10 reps Gluteal Sets: AROM;Both;10 reps Heel Slides: AAROM;Both;10 reps Hip ABduction/ADduction: AAROM;Both;10 reps Other Exercises Other Exercises: HEP education to pt/spouse with BLE APs, QS, and GS x 10 each 5-6x/day   Assessment/Plan    PT Assessment Patient needs continued PT services  PT Problem List Decreased  strength;Decreased activity tolerance;Decreased balance;Decreased mobility;Decreased knowledge of use of DME          PT Treatment Interventions DME instruction;Gait training;Functional mobility training;Stair training;Therapeutic activities;Therapeutic exercise;Balance training;Neuromuscular re-education;Patient/family education    PT Goals (Current goals can be found in the Care Plan section)  Acute Rehab PT Goals Patient Stated Goal: To golf again PT Goal Formulation: With patient Time For Goal Achievement: 10/19/16 Potential to Achieve Goals: Fair    Frequency Min 2X/week   Barriers to discharge Inaccessible home environment      Co-evaluation               End of Session Equipment Utilized During Treatment: Gait belt;Oxygen Activity Tolerance: Patient limited by fatigue Patient left: in bed;with call bell/phone within reach;with family/visitor present Nurse Communication: Mobility status;Other (comment) (Nsg notified of bed alarm not turned on)         Time: 0915-0958 PT Time Calculation (min) (ACUTE ONLY): 43 min   Charges:   PT Evaluation $PT Eval Moderate Complexity: 1 Procedure PT Treatments $Therapeutic Exercise: 8-22 mins   PT G Codes:        DRoyetta Asal PT, DPT 10/06/16, 1:29 PM

## 2016-10-06 NOTE — Progress Notes (Signed)
Report given to [facility], Therapist, sports. Patient transferred to rm 260, RN Amy present on arrival. Telemetry box placed on patient and verified. Patient's wife notified of transfer. CCMD and Elink notified. Wilnette Kales

## 2016-10-06 NOTE — Progress Notes (Signed)
[facility] H&P    ASSESSMENT/PLAN   70 year old male with acute respiratory failure, possible secondary to pneumonia and/or ARDS, on minimal oxygen now with agitation and delerium with gram pos bacteremia-MSSA  PULMONARY A:Acute hypoxic respiratory failure, etiology uncertain. -Bibasilar atelectasis. -Pneumonia. CXR reviewed, mild changes of bronchitis, extubated P:   Follow up ID recs   CARDIOVASCULAR A: -Afib with RVR, has been on amiodarone for several days without control.  -Increased troponins.  INFECTIOUS A:  Pneumonia. P:   -Continue antibiotics per ID Plan for TEE at some point  Micro/culture results:  BCx2 12/15--gram-positive cocci, PCR positive for staph aureus. UC 12/15 . Pending Sputum 12/15 --pending Flu negative.  CSF 12/15: Pending  Antibiotics: Zosyn 12/15>> 12/15 Levaquin 12/15>>12/15 Vancomycin 12/15>> Acyclovir 12/15>> Cefazolin 12/15>> Procalcitonin; 6.55>>4.36  ENDOCRINE A:  SSI.     NEUROLOGIC A:  Acute metabolic encephalopathy. -wean off precedex, try xanax  MAJOR EVENTS/TEST RESULTS: LP 12/15;   Best Practices  DVT Prophylaxis: enoxaparin GI Prophylaxis: famotidine.    ---------------------------------------  ---------------------------------------   Name: [name] MRN: [id number] DOB: 1946-07-25    ADMISSION DATE:  10/02/2016   Subjective: Extubated, on minimal oxygen On precedex for delerium  Prior to Admission medications   Medication Sig Start Date End Date Taking? Authorizing Provider  aspirin EC 81 MG tablet Take 1 tablet by mouth daily.   Yes Historical Provider, MD  Cyanocobalamin (RA VITAMIN B-12 TR) 1000 MCG TBCR Take 1 tablet by mouth daily.   Yes Historical Provider, MD  cyclobenzaprine (FLEXERIL) 10 MG tablet Take 1 tablet by mouth daily.   Yes Historical Provider, MD  DULoxetine (CYMBALTA) 60 MG capsule Take 1 capsule by mouth at bedtime. 07/29/15  Yes Historical  Provider, MD  furosemide (LASIX) 40 MG tablet Take 1 tablet by mouth 2 (two) times daily. 03/19/16  Yes Historical Provider, MD  gabapentin (NEURONTIN) 600 MG tablet Take 1 tablet by mouth 3 (three) times daily. 11/21/12  Yes Historical Provider, MD  lisinopril (PRINIVIL,ZESTRIL) 10 MG tablet Take 1 tablet by mouth daily. 02/10/16  Yes Historical Provider, MD  Loratadine 10 MG CAPS Take 1 capsule by mouth daily.   Yes Historical Provider, MD  meclizine (ANTIVERT) 25 MG tablet Take 1 tablet by mouth 3 (three) times daily. 03/19/16  Yes Historical Provider, MD  metFORMIN (GLUCOPHAGE) 1000 MG tablet Take 1 tablet by mouth 2 (two) times daily. With meals. 03/19/16  Yes Historical Provider, MD  Multiple Vitamin (MULTI-VITAMINS) TABS Take 1 tablet by mouth daily.   Yes Historical Provider, MD  omeprazole (PRILOSEC) 20 MG capsule Take 20 mg by mouth daily.   Yes Historical Provider, MD  Oxcarbazepine (TRILEPTAL) 300 MG tablet Take 1 tablet by mouth 2 (two) times daily. 12/05/12  Yes Historical Provider, MD  pravastatin (PRAVACHOL) 40 MG tablet Take 1 tablet by mouth at bedtime. 02/10/16  Yes Historical Provider, MD  methadone (DOLOPHINE) 5 MG tablet Take 1 tablet by mouth 3 (three) times daily. 09/02/16   Historical Provider, MD  metoprolol tartrate (LOPRESSOR) 25 MG tablet Take 1 tablet by mouth 2 (two) times daily. 07/20/16   Historical Provider, MD   Allergies  Allergen Reactions  . Acetaminophen-Codeine     Other reaction(s): Headache  . Rofecoxib Other (See Comments)    ulcers  . Tetracyclines & Related    REVIEW OF SYSTEMS:   Could not be obtained due to critical illness.   VITAL SIGNS: Temp:  [99.7 F (37.6 C)-100.8 F (38.2  C)] 100 F (37.8 C) (12/19 0700) Pulse Rate:  [34-103] 86 (12/19 0700) Resp:  [14-22] 15 (12/19 0700) BP: (90-129)/(53-85) 129/85 (12/19 0700) SpO2:  [95 %-100 %] 98 % (12/19 0700) Weight:  [285 lb 11.5 oz (129.6 kg)] 285 lb 11.5 oz (129.6 kg) (12/19 0500) HEMODYNAMICS:    VENTILATOR SETTINGS:   INTAKE / OUTPUT:  Intake/Output Summary (Last 24 hours) at 10/06/16 0803 Last data filed at 10/06/16 0524  Gross per 24 hour  Intake          2559.39 ml  Output             1360 ml  Net          1199.39 ml    Physical Examination:   VS: BP 129/85   Pulse 86   Temp 100 F (37.8 C) (Core (Comment))   Resp 15   Ht '5\' 11"'$  (1.803 m)   Wt 285 lb 11.5 oz (129.6 kg)   SpO2 98%   BMI 39.85 kg/m   General Appearance: minimal distress  Neuro:without focal findings, mental status reduced.  HEENT: PERRLA, EOM intact, no ptosis. Pulmonary: normal breath sounds. CardiovascularNormal S1,S2.  No m/r/g.    Abdomen: Benign, Soft, non-tender. Renal:  No costovertebral tenderness  GU:  Not performed at this time. Endoc: No evident thyromegaly, no signs of acromegaly. Skin:   warm, no rashes, no ecchymosis  Extremities: normal, no cyanosis, clubbing, no edema, warm with normal capillary refill.    LABS: Reviewed   LABORATORY PANEL:   CBC  Recent Labs Lab 10/05/16 0431  WBC 6.1  HGB 11.4*  HCT 33.7*  PLT 105*    Chemistries   Recent Labs Lab 10/02/16 1826  10/05/16 0431 10/06/16 0539  NA 131*  < > 138 136  K 3.2*  < > 3.3* 3.3*  CL 100*  < > 104 103  CO2 20*  < > 27 25  GLUCOSE 221*  < > 131* 108*  BUN 13  < > 13 13  CREATININE 0.78  < > 0.75 0.72  CALCIUM 7.5*  < > 8.0* 8.1*  MG 1.7  < > 2.3  --   PHOS  --   < > 2.9  --   AST 122*  --   --   --   ALT 59  --   --   --   ALKPHOS 66  --   --   --   BILITOT 0.7  --   --   --   < > = values in this interval not displayed.   Recent Labs Lab 10/05/16 1203 10/05/16 1556 10/05/16 1930 10/05/16 2359 10/06/16 0359 10/06/16 0726  GLUCAP 139* 150* 119* 112* 102* 109*    Recent Labs Lab 10/03/16 1615 10/04/16 0500 10/04/16 1218  PHART 7.34* 7.37 7.35  PCO2ART 50* 49* 53*  PO2ART 95 138* 95    Recent Labs Lab 10/02/16 0816 10/02/16 1826  AST 97* 122*  ALT 44 59  ALKPHOS 76  66  BILITOT 0.7 0.7  ALBUMIN 3.5 3.2*    Cardiac Enzymes  Recent Labs Lab 10/02/16 0816  TROPONINI 0.06*    RADIOLOGY:  No results found.     I have personally obtained a history, examined the patient, evaluated Pertinent laboratory and RadioGraphic/imaging results, and  formulated the assessment and plan   The Patient requires high complexity decision making for assessment and support, frequent evaluation and titration of therapies, application of advanced monitoring technologies and extensive  interpretation of multiple databases. Critical Care Time devoted to patient care services described in this note is 35 minutes.   Overall, patient is critically ill, prognosis is guarded.   Follow up ID and Neuro recs   Corrin Parker, M.D.  [facility]  Medical Director [place] Director [facility] Cardio-Pulmonary Department

## 2016-10-06 NOTE — Progress Notes (Signed)
Per Dr. Posey Pronto, okay to d/c neuro checks. Order placed. Wilnette Kales

## 2016-10-06 NOTE — Progress Notes (Signed)
[place] for Cefazolin Dosing, Electrolyte Replacement, Rivaroxaban doing.   Pharmacy consulted as above for 70 yo male admitted with acute respiratory - s/p extubation. Patient is now being treated for MSSA bacteremia and Afib with RVR.    Plan:   1. Cefazolin: continue cefazolin 2g IV Q8hr. Follow-up cultures obtained on 12/18. Will continue to follow.  2. Electrolyte Replacement: Patient received potassium 61mq PO x 1 on 12/18. Will order potassium 414m PO BID and recheck electroltyes with am labs.   3. Rivaroxaban: Patient CHADsVASc is 3 (Age, HTN, Diabetes). Will start patient on rivaroxaban '20mg'$  Daily with supper. Patient last received enoxparin on 12/18.    Allergies  Allergen Reactions  . Acetaminophen-Codeine     Other reaction(s): Headache  . Rofecoxib Other (See Comments)    ulcers  . Tetracyclines & Related     Patient Measurements: Height: '5\' 11"'$  (180.3 cm) Weight: 285 lb 11.5 oz (129.6 kg) IBW/kg (Calculated) : 75.3  Vital Signs: Temp: 99.5 F (37.5 C) (12/19 0900) Temp Source: Core (Comment) (12/19 0800) BP: 138/92 (12/19 1000) Pulse Rate: 90 (12/19 1000) Intake/Output from previous day: 12/18 0701 - 12/19 0700 In: 2559.4 [P.O.:1020; I.V.:1339.4; IV Piggyback:200] Out: 1360 [Urine:1360] Intake/Output from this shift: Total I/O In: 200 [P.O.:200] Out: 400 [Urine:400]  Labs:  Recent Labs  10/04/16 0511 10/05/16 0431 10/06/16 0539  WBC 4.1 6.1  --   HGB 11.7* 11.4*  --   HCT 34.7* 33.7*  --   PLT 88* 105*  --   CREATININE 0.84 0.75 0.72  MG 2.2 2.3 2.2  PHOS 2.1* 2.9 3.1   Estimated Creatinine Clearance: 117.9 mL/min (by C-G formula based on SCr of 0.72 mg/dL).   Microbiology: Recent Results (from the past 720 hour(s))  Blood Culture (routine x 2)     Status: Abnormal   Collection Time: 10/02/16  8:16 AM  Result Value Ref Range Status   Specimen Description BLOOD RIGHT ARM  Final   Special Requests    Final    BOTTLES DRAWN AEROBIC AND ANAEROBIC  AER 8CC ANA 10CC   Culture  Setup Time   Final    GRAM POSITIVE COCCI IN BOTH AEROBIC AND ANAEROBIC BOTTLES CRITICAL VALUE NOTED.  VALUE IS CONSISTENT WITH PREVIOUSLY REPORTED AND CALLED VALUE.    Culture (A)  Final    STAPHYLOCOCCUS AUREUS SUSCEPTIBILITIES PERFORMED ON PREVIOUS CULTURE WITHIN THE LAST 5 DAYS. Performed at [facility]  Report Status 10/05/2016 FINAL  Final  Blood Culture (routine x 2)     Status: Abnormal   Collection Time: 10/02/16  8:16 AM  Result Value Ref Range Status   Specimen Description BLOOD LEFT ANTECUBITAL  Final   Special Requests   Final    BOTTLES DRAWN AEROBIC AND ANAEROBIC  AER 11CC ANA 12CC   Culture  Setup Time   Final    GRAM POSITIVE COCCI IN BOTH AEROBIC AND ANAEROBIC BOTTLES CRITICAL RESULT CALLED TO, READ BACK BY AND VERIFIED WITH: KeCarmin Richmond [id number]/17 [address] [facility])  Final   Report Status 10/04/2016 FINAL  Final   Organism ID, Bacteria STAPHYLOCOCCUS AUREUS  Final      Susceptibility   Staphylococcus aureus - MIC*    CIPROFLOXACIN <=0.5 SENSITIVE Sensitive     ERYTHROMYCIN 0.5 SENSITIVE Sensitive     GENTAMICIN <=0.5 SENSITIVE Sensitive     OXACILLIN 0.5 SENSITIVE Sensitive  TETRACYCLINE <=1 SENSITIVE Sensitive     VANCOMYCIN 1 SENSITIVE Sensitive     TRIMETH/SULFA <=10 SENSITIVE Sensitive     CLINDAMYCIN <=0.25 SENSITIVE Sensitive     RIFAMPIN <=0.5 SENSITIVE Sensitive     Inducible Clindamycin NEGATIVE Sensitive     * STAPHYLOCOCCUS AUREUS  Urine culture     Status: None   Collection Time: 10/02/16  8:16 AM  Result Value Ref Range Status   Specimen Description URINE, RANDOM  Final   Special Requests NONE  Final   Culture NO GROWTH Performed at [facility]   Final   Report Status 10/03/2016 FINAL  Final  Blood Culture ID Panel (Reflexed)     Status: Abnormal   Collection Time: 10/02/16   8:16 AM  Result Value Ref Range Status   Enterococcus species NOT DETECTED NOT DETECTED Final   Listeria monocytogenes NOT DETECTED NOT DETECTED Final   Staphylococcus species DETECTED (A) NOT DETECTED Final    Comment: CRITICAL RESULT CALLED TO, READ BACK BY AND VERIFIED WITH: Carmin Richmond @ [id number] 10/02/16 by [place]    Staphylococcus aureus DETECTED (A) NOT DETECTED Final    Comment: CRITICAL RESULT CALLED TO, READ BACK BY AND VERIFIED WITH: Carmin Richmond @ 1324 10/02/16 by [facility]    Methicillin resistance NOT DETECTED NOT DETECTED Final   Streptococcus species NOT DETECTED NOT DETECTED Final   Streptococcus agalactiae NOT DETECTED NOT DETECTED Final   Streptococcus pneumoniae NOT DETECTED NOT DETECTED Final   Streptococcus pyogenes NOT DETECTED NOT DETECTED Final   Acinetobacter baumannii NOT DETECTED NOT DETECTED Final   Enterobacteriaceae species NOT DETECTED NOT DETECTED Final   Enterobacter cloacae complex NOT DETECTED NOT DETECTED Final   Escherichia coli NOT DETECTED NOT DETECTED Final   Klebsiella oxytoca NOT DETECTED NOT DETECTED Final   Klebsiella pneumoniae NOT DETECTED NOT DETECTED Final   Proteus species NOT DETECTED NOT DETECTED Final   Serratia marcescens NOT DETECTED NOT DETECTED Final   Haemophilus influenzae NOT DETECTED NOT DETECTED Final   Neisseria meningitidis NOT DETECTED NOT DETECTED Final   Pseudomonas aeruginosa NOT DETECTED NOT DETECTED Final   Candida albicans NOT DETECTED NOT DETECTED Final   Candida glabrata NOT DETECTED NOT DETECTED Final   Candida krusei NOT DETECTED NOT DETECTED Final   Candida parapsilosis NOT DETECTED NOT DETECTED Final   Candida tropicalis NOT DETECTED NOT DETECTED Final  MRSA PCR Screening     Status: None   Collection Time: 10/02/16  2:40 PM  Result Value Ref Range Status   MRSA by PCR NEGATIVE NEGATIVE Final    Comment:        The GeneXpert MRSA Assay (FDA approved for NASAL specimens only), is one component of  a comprehensive MRSA colonization surveillance program. It is not intended to diagnose MRSA infection nor to guide or monitor treatment for MRSA infections.   CSF culture     Status: None   Collection Time: 10/02/16  3:13 PM  Result Value Ref Range Status   Specimen Description CSF  Final   Special Requests Normal  Final   Gram Stain   Final    RARE WBC SEEN RARE RED BLOOD CELLS NO ORGANISMS SEEN    Culture   Final    NO GROWTH 3 DAYS Performed at [facility]    Report Status 10/06/2016 FINAL  Final  Culture, fungus without smear     Status: None (Preliminary result)   Collection Time: 10/02/16  3:14 PM  Result Value Ref Range  Status   Specimen Description CSF  Final   Special Requests Normal  Final   Culture   Final    NO FUNGUS ISOLATED AFTER 3 DAYS Performed at [facility]    Report Status PENDING  Incomplete  Culture, expectorated sputum-assessment     Status: None   Collection Time: 10/02/16  6:18 PM  Result Value Ref Range Status   Specimen Description TRACHEAL ASPIRATE  Final   Special Requests NONE  Final   Sputum evaluation THIS SPECIMEN IS ACCEPTABLE FOR SPUTUM CULTURE  Final   Report Status 10/02/2016 FINAL  Final  Culture, respiratory (NON-Expectorated)     Status: None   Collection Time: 10/02/16  6:18 PM  Result Value Ref Range Status   Specimen Description TRACHEAL ASPIRATE  Final   Special Requests NONE Reflexed from [id number]  Final   Gram Stain   Final    ABUNDANT WBC PRESENT, PREDOMINANTLY MONONUCLEAR FEW GRAM NEGATIVE COCCI IN PAIRS RARE GRAM POSITIVE RODS    Culture   Final    Consistent with normal respiratory flora. Performed at [facility]    Report Status 10/05/2016 FINAL  Final  Culture, blood (single) w Reflex to ID Panel     Status: None (Preliminary result)   Collection Time: 10/05/16  5:23 PM  Result Value Ref Range Status   Specimen Description BLOOD LEFT WRIST  Final   Special Requests   Final    BOTTLES  DRAWN AEROBIC AND ANAEROBIC AER 11ML ANA 6ML   Culture NO GROWTH < 24 HOURS  Final   Report Status PENDING  Incomplete    Medical History: Past Medical History:  Diagnosis Date  . Chronic pain   . Coronary artery disease   . Diabetes mellitus without complication ([place])   . DJD (degenerative joint disease)   . GERD (gastroesophageal reflux disease)   . Hyperlipemia   . Hypertension   . Insomnia   . Neuropathy [facility])     Pharmacy will continue to monitor and adjust per consult.   Simpson,Michael L 10/06/2016,11:31 AM

## 2016-10-07 DIAGNOSIS — G9341 Metabolic encephalopathy: Secondary | ICD-10-CM | POA: Diagnosis not present

## 2016-10-07 DIAGNOSIS — A4101 Sepsis due to Methicillin susceptible Staphylococcus aureus: Secondary | ICD-10-CM | POA: Diagnosis not present

## 2016-10-07 DIAGNOSIS — J9690 Respiratory failure, unspecified, unspecified whether with hypoxia or hypercapnia: Secondary | ICD-10-CM | POA: Diagnosis not present

## 2016-10-07 DIAGNOSIS — I4891 Unspecified atrial fibrillation: Secondary | ICD-10-CM | POA: Diagnosis not present

## 2016-10-07 LAB — BASIC METABOLIC PANEL
Anion gap: 6 (ref 5–15)
BUN: 13 mg/dL (ref 6–20)
CO2: 26 mmol/L (ref 22–32)
Calcium: 8.1 mg/dL — ABNORMAL LOW (ref 8.9–10.3)
Chloride: 106 mmol/L (ref 101–111)
Creatinine, Ser: 0.57 mg/dL — ABNORMAL LOW (ref 0.61–1.24)
GFR calc Af Amer: >60 mL/min (ref >60)
GLUCOSE: 128 mg/dL — AB (ref 65–99)
POTASSIUM: 3.7 mmol/L (ref 3.5–5.1)
Sodium: 138 mmol/L (ref 135–145)

## 2016-10-07 LAB — GLUCOSE, CAPILLARY
GLUCOSE-CAPILLARY: 111 mg/dL — AB (ref 65–99)
GLUCOSE-CAPILLARY: 131 mg/dL — AB (ref 65–99)
GLUCOSE-CAPILLARY: 127 mg/dL — AB (ref 65–99)
Glucose-Capillary: 127 mg/dL — ABNORMAL HIGH (ref 65–99)
Glucose-Capillary: 245 mg/dL — ABNORMAL HIGH (ref 65–99)

## 2016-10-07 MED ORDER — TAMSULOSIN HCL 0.4 MG PO CAPS
0.4000 mg | ORAL_CAPSULE | Freq: Every day | ORAL | Status: DC
  Administered 2016-10-07 – 2016-10-10 (×4): 0.4 mg via ORAL
  Filled 2016-10-07 (×4): qty 1

## 2016-10-07 MED ORDER — POTASSIUM CHLORIDE CRYS ER 20 MEQ PO TBCR
40.0000 meq | EXTENDED_RELEASE_TABLET | Freq: Two times a day (BID) | ORAL | Status: AC
  Administered 2016-10-07 (×2): 40 meq via ORAL
  Filled 2016-10-07 (×2): qty 2

## 2016-10-07 MED ORDER — METOPROLOL TARTRATE 25 MG PO TABS
25.0000 mg | ORAL_TABLET | Freq: Three times a day (TID) | ORAL | Status: DC
  Administered 2016-10-07 – 2016-10-10 (×11): 25 mg via ORAL
  Filled 2016-10-07 (×11): qty 1

## 2016-10-07 MED ORDER — SODIUM CHLORIDE 0.9 % IV SOLN: INTRAVENOUS | Status: DC

## 2016-10-07 MED ORDER — POLYETHYLENE GLYCOL 3350 17 G PO PACK
17.0000 g | PACK | Freq: Every day | ORAL | Status: DC
  Administered 2016-10-07 – 2016-10-09 (×2): 17 g via ORAL
  Filled 2016-10-07 (×3): qty 1

## 2016-10-07 NOTE — Progress Notes (Signed)
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 Pt able to open eyes and occasionally participate in AAROM therex.  Nsg entered room while performing therex asking to let PT rest as has been highly agitated.  Wife entered room, reviewed with wife HEP including akle pumps, heel slides, hip abd/add x 10 bilaterally. Pt left in bed with wife present and alarm on.   Follow Up Recommendations  SNF     Equipment Recommendations  None recommended by PT    Recommendations for Other Services       Precautions / Restrictions Restrictions Weight Bearing Restrictions: No    Mobility  Bed Mobility                  Transfers                    Ambulation/Gait                 Stairs            Wheelchair Mobility    Modified Rankin (Stroke Patients Only)       Balance                                    Cognition Arousal/Alertness: Lethargic Behavior During Therapy: WFL for tasks assessed/performed Overall Cognitive Status: Impaired/Different from baseline Area of Impairment: Attention;Following commands Orientation Level: Person;Place Current Attention Level: Alternating   Following Commands: Follows one step commands inconsistently       General Comments: difficult to arouse    Exercises Total Joint Exercises Ankle Circles/Pumps: AAROM;10 reps;Supine Heel Slides: AAROM;Both;10 reps Hip ABduction/ADduction: AAROM;Both;10 reps    General Comments        Pertinent Vitals/Pain Pain Assessment: No/denies pain    Home Living                      Prior  Function            PT Goals (current goals can now be found in the care plan section)      Frequency    Min 2X/week      PT Plan      Co-evaluation             End of Session   Activity Tolerance: Patient limited by lethargy Patient left: in bed;with bed alarm set;with family/visitor present     Time: 0762-2633 PT Time Calculation (min) (ACUTE ONLY): 10 min  Charges:  $Therapeutic Exercise: 8-22 mins                    G Codes:      Bryan Goin  Lucindy Borel, PTA 10/07/2016, 1:42 PM

## 2016-10-07 NOTE — Progress Notes (Signed)
[place] INFECTIOUS DISEASE PROGRESS NOTE Date of Admission:  10/02/2016     ID: TEVITA GOMER is a 70 y.o. male with sepsis, MSSA bacteremia Active Problems:   Sepsis ([place])   Pressure injury of skin   Subjective: Still lethargic but no fevers  ROS  Unable to obtain   Medications:  Antibiotics Given (last 72 hours)    Date/Time Action Medication Dose Rate   10/04/16 2208 Given   ceFAZolin (ANCEF) IVPB 2g/100 mL premix 2 g 200 mL/hr   10/04/16 2330 Given   acyclovir (ZOVIRAX) 950 mg in dextrose 5 % 150 mL IVPB 950 mg 169 mL/hr   10/05/16 0626 Given   ceFAZolin (ANCEF) IVPB 2g/100 mL premix 2 g 200 mL/hr   10/05/16 0700 Given   acyclovir (ZOVIRAX) 950 mg in dextrose 5 % 150 mL IVPB 950 mg 169 mL/hr   10/05/16 1507 Given   ceFAZolin (ANCEF) IVPB 2g/100 mL premix 2 g 200 mL/hr   10/05/16 1508 Given   acyclovir (ZOVIRAX) 950 mg in dextrose 5 % 150 mL IVPB 950 mg 169 mL/hr   10/05/16 2140 Given   ceFAZolin (ANCEF) IVPB 2g/100 mL premix 2 g 200 mL/hr   10/06/16 0524 Given   ceFAZolin (ANCEF) IVPB 2g/100 mL premix 2 g 200 mL/hr   10/06/16 1415 Given   ceFAZolin (ANCEF) IVPB 2g/100 mL premix 2 g 200 mL/hr   10/06/16 2144 Given   ceFAZolin (ANCEF) IVPB 2g/100 mL premix 2 g 200 mL/hr   10/07/16 0539 Given   ceFAZolin (ANCEF) IVPB 2g/100 mL premix 2 g 200 mL/hr   10/07/16 1407 Given   ceFAZolin (ANCEF) IVPB 2g/100 mL premix 2 g 200 mL/hr     . ALPRAZolam  0.5 mg Oral TID  . amiodarone  200 mg Oral Daily  .  ceFAZolin (ANCEF) IV  2 g Intravenous Q8H  . chlorhexidine gluconate (MEDLINE KIT)  15 mL Mouth Rinse BID  . ciprofloxacin  1 drop Both Eyes Q4H  . DULoxetine  60 mg Oral Daily  . famotidine  20 mg Oral BID  . gabapentin  600 mg Oral TID  . insulin aspart  0-15 Units Subcutaneous TID WC  . insulin aspart  0-5 Units Subcutaneous QHS  . ipratropium-albuterol  3 mL Nebulization TID  . methadone  5 mg Oral TID  . metoprolol tartrate  25 mg Oral TID  .  multivitamins with iron  1 tablet Oral Daily  . OXcarbazepine  300 mg Oral BID  . potassium chloride  40 mEq Oral BID  . pravastatin  40 mg Oral q1800  . rivaroxaban  20 mg Oral Q supper  . senna-docusate  1 tablet Oral BID  . tamsulosin  0.4 mg Oral Daily    Objective: Vital signs in last 24 hours: Temp:  [97.8 F (36.6 C)-100.2 F (37.9 C)] 97.8 F (36.6 C) (12/20 1156) Pulse Rate:  [80-103] 90 (12/20 1156) Resp:  [18-20] 18 (12/20 1156) BP: (132-143)/(67-92) 142/67 (12/20 1156) SpO2:  [94 %-100 %] 94 % (12/20 1429) Weight:  [130.1 kg (286 lb 14.4 oz)] 130.1 kg (286 lb 14.4 oz) (12/20 0431) Constitutional: awake, more alert.  HENT: anicteric, perrla Mouth/Throat: OP clear  Cardiovascular: Tachy, reg, 2/6 sm Pulmonary/Chest: Decent air movement  Abdominal: Soft. Bowel sounds are normal. He exhibits no distension. There is no tenderness.  Lymphadenopathy: He has no cervical adenopathy.  Neurological arousable, slightly confused Ext L knee prosthetic joint  Skin: bil Le abrasions   Lab Results  Recent Labs  10/05/16 0431 10/06/16 0539 10/07/16 0455  WBC 6.1  --   --   HGB 11.4*  --   --   HCT 33.7*  --   --   NA 138 136 138  K 3.3* 3.3* 3.7  CL 104 103 106  CO2 _0 BUN _1 CREATININE 0.75 0.72 0.57*    Results for orders placed or performed during the hospital encounter of 10/02/16  Blood Culture (routine x 2)     Status: Abnormal   Collection Time: 10/02/16  8:16 AM  Result Value Ref Range Status   Specimen Description BLOOD RIGHT ARM  Final   Special Requests   Final    BOTTLES DRAWN AEROBIC AND ANAEROBIC  AER 8CC ANA 10CC   Culture  Setup Time   Final    GRAM POSITIVE COCCI IN BOTH AEROBIC AND ANAEROBIC BOTTLES CRITICAL VALUE NOTED.  VALUE IS CONSISTENT WITH PREVIOUSLY REPORTED AND CALLED VALUE.    Culture (A)  Final    STAPHYLOCOCCUS AUREUS SUSCEPTIBILITIES PERFORMED ON PREVIOUS CULTURE WITHIN THE LAST 5 DAYS. Performed at [facility]    Report Status 10/05/2016 FINAL  Final  Blood Culture (routine x 2)     Status: Abnormal   Collection Time: 10/02/16  8:16 AM  Result Value Ref Range Status   Specimen Description BLOOD LEFT ANTECUBITAL  Final   Special Requests   Final    BOTTLES DRAWN AEROBIC AND ANAEROBIC  AER 11CC ANA 12CC   Culture  Setup Time   Final    GRAM POSITIVE COCCI IN BOTH AEROBIC AND ANAEROBIC BOTTLES CRITICAL RESULT CALLED TO, READ BACK BY AND VERIFIED WITH: Carmin Richmond @ 2376 10/02/16 by [facility] Performed at [place] (A)  Final   Report Status 10/04/2016 FINAL  Final   Organism ID, Bacteria STAPHYLOCOCCUS AUREUS  Final      Susceptibility   Staphylococcus aureus - MIC*    CIPROFLOXACIN <=0.5 SENSITIVE Sensitive     ERYTHROMYCIN 0.5 SENSITIVE Sensitive     GENTAMICIN <=0.5 SENSITIVE Sensitive     OXACILLIN 0.5 SENSITIVE Sensitive     TETRACYCLINE <=1 SENSITIVE Sensitive     VANCOMYCIN 1 SENSITIVE Sensitive     TRIMETH/SULFA <=10 SENSITIVE Sensitive     CLINDAMYCIN <=0.25 SENSITIVE Sensitive     RIFAMPIN <=0.5 SENSITIVE Sensitive     Inducible Clindamycin NEGATIVE Sensitive     * STAPHYLOCOCCUS AUREUS  Urine culture     Status: None   Collection Time: 10/02/16  8:16 AM  Result Value Ref Range Status   Specimen Description URINE, RANDOM  Final   Special Requests NONE  Final   Culture NO GROWTH Performed at [facility]   Final   Report Status 10/03/2016 FINAL  Final  Blood Culture ID Panel (Reflexed)     Status: Abnormal   Collection Time: 10/02/16  8:16 AM  Result Value Ref Range Status   Enterococcus species NOT DETECTED NOT DETECTED Final   Listeria monocytogenes NOT DETECTED NOT DETECTED Final   Staphylococcus species DETECTED (A) NOT DETECTED Final    Comment: CRITICAL RESULT CALLED TO, READ BACK BY AND VERIFIED WITH: Carmin Richmond @ 2831 10/02/16 by [place]    Staphylococcus aureus DETECTED (A) NOT DETECTED Final     Comment: CRITICAL RESULT CALLED TO, READ BACK BY AND VERIFIED WITH: Carmin Richmond @ 5176 10/02/16 by [facility]    Methicillin resistance NOT  DETECTED NOT DETECTED Final   Streptococcus species NOT DETECTED NOT DETECTED Final   Streptococcus agalactiae NOT DETECTED NOT DETECTED Final   Streptococcus pneumoniae NOT DETECTED NOT DETECTED Final   Streptococcus pyogenes NOT DETECTED NOT DETECTED Final   Acinetobacter baumannii NOT DETECTED NOT DETECTED Final   Enterobacteriaceae species NOT DETECTED NOT DETECTED Final   Enterobacter cloacae complex NOT DETECTED NOT DETECTED Final   Escherichia coli NOT DETECTED NOT DETECTED Final   Klebsiella oxytoca NOT DETECTED NOT DETECTED Final   Klebsiella pneumoniae NOT DETECTED NOT DETECTED Final   Proteus species NOT DETECTED NOT DETECTED Final   Serratia marcescens NOT DETECTED NOT DETECTED Final   Haemophilus influenzae NOT DETECTED NOT DETECTED Final   Neisseria meningitidis NOT DETECTED NOT DETECTED Final   Pseudomonas aeruginosa NOT DETECTED NOT DETECTED Final   Candida albicans NOT DETECTED NOT DETECTED Final   Candida glabrata NOT DETECTED NOT DETECTED Final   Candida krusei NOT DETECTED NOT DETECTED Final   Candida parapsilosis NOT DETECTED NOT DETECTED Final   Candida tropicalis NOT DETECTED NOT DETECTED Final  MRSA PCR Screening     Status: None   Collection Time: 10/02/16  2:40 PM  Result Value Ref Range Status   MRSA by PCR NEGATIVE NEGATIVE Final    Comment:        The GeneXpert MRSA Assay (FDA approved for NASAL specimens only), is one component of a comprehensive MRSA colonization surveillance program. It is not intended to diagnose MRSA infection nor to guide or monitor treatment for MRSA infections.   CSF culture     Status: None   Collection Time: 10/02/16  3:13 PM  Result Value Ref Range Status   Specimen Description CSF  Final   Special Requests Normal  Final   Gram Stain   Final    RARE WBC SEEN RARE RED BLOOD  CELLS NO ORGANISMS SEEN    Culture   Final    NO GROWTH 3 DAYS Performed at [facility]    Report Status 10/06/2016 FINAL  Final  Culture, fungus without smear     Status: None (Preliminary result)   Collection Time: 10/02/16  3:14 PM  Result Value Ref Range Status   Specimen Description CSF  Final   Special Requests Normal  Final   Culture   Final    NO FUNGUS ISOLATED AFTER 3 DAYS Performed at [facility]    Report Status PENDING  Incomplete  Fungus Culture With Stain     Status: None (Preliminary result)   Collection Time: 10/02/16  5:07 PM  Result Value Ref Range Status   Fungus Stain Final report  Final    Comment: (NOTE) Performed At: [facility] [place], [place] [id number] Lindon Romp MD [id number]    Fungus (Mycology) Culture PENDING  Incomplete   Fungal Source CSF  Final  Fungus Culture Result     Status: None   Collection Time: 10/02/16  5:07 PM  Result Value Ref Range Status   Result 1 Comment  Final    Comment: (NOTE) KOH/Calcofluor preparation:  no fungus observed. Performed At: [facility] [place], [place] [id number] Lindon Romp MD [id number]   Culture, expectorated sputum-assessment     Status: None   Collection Time: 10/02/16  6:18 PM  Result Value Ref Range Status   Specimen Description TRACHEAL ASPIRATE  Final   Special Requests NONE  Final   Sputum evaluation THIS SPECIMEN IS ACCEPTABLE FOR  SPUTUM CULTURE  Final   Report Status 10/02/2016 FINAL  Final  Culture, respiratory (NON-Expectorated)     Status: None   Collection Time: 10/02/16  6:18 PM  Result Value Ref Range Status   Specimen Description TRACHEAL ASPIRATE  Final   Special Requests NONE Reflexed from [id number]  Final   Gram Stain   Final    ABUNDANT WBC PRESENT, PREDOMINANTLY MONONUCLEAR FEW GRAM NEGATIVE COCCI IN PAIRS RARE GRAM POSITIVE RODS    Culture   Final    Consistent with normal respiratory  flora. Performed at [facility]    Report Status 10/05/2016 FINAL  Final  Culture, blood (single) w Reflex to ID Panel     Status: None (Preliminary result)   Collection Time: 10/05/16  5:23 PM  Result Value Ref Range Status   Specimen Description BLOOD LEFT WRIST  Final   Special Requests   Final    BOTTLES DRAWN AEROBIC AND ANAEROBIC AER 11ML ANA 6ML   Culture NO GROWTH 2 DAYS  Final   Report Status PENDING  Incomplete    Studies/Results: No results found.  Assessment/Plan: JARIEL DROST is a 70 y.o. male admitted with sepsis and confusion. His LP is relatively unrevealing, CT chest with possible aspiration. BCX + GPC with MSSA Initially received empiric treatment for meningiits but CSF not consistent with this. I suspect he has staph bacteremia from the some abrasions on his legs after his fall Echo shows bioprosthetic valve, no veg Repeat bcx pending 12/18.  Recommendations Cont  Ancef - will need 2 week minimum course Can place  Would suggest TEE once he is a little more stable to help decide duration of ancef. I will consult cards for this and they can decide when stable enough to do it. - patient's wife. [phone]  Thank you very much for the consult. Will follow with you.  [place], Bev Drennen P   10/07/2016, 3:29 PM

## 2016-10-07 NOTE — Progress Notes (Addendum)
[name] at [place] NAME: [name]    MR#:  [id number]  DATE OF BIRTH:  [birth year]  SUBJECTIVE:  CHIEF COMPLAINT:   Chief Complaint  Patient presents with  . Altered Mental Status  . Code Sepsis   - Patient transferred from ICU- admitted with sepsis, MSSA bacteremia - cardiology consult pending for TEE and also will need PICC line today for IV ancef - needs SNF placement. - appears confused.  REVIEW OF SYSTEMS:  Review of Systems  Constitutional: Positive for malaise/fatigue. Negative for chills and fever.  HENT: Negative for ear discharge, ear pain, hearing loss and nosebleeds.   Eyes: Negative for blurred vision and double vision.  Respiratory: Negative for cough, shortness of breath and wheezing.   Cardiovascular: Negative for chest pain, palpitations and leg swelling.  Gastrointestinal: Negative for abdominal pain, constipation, diarrhea, nausea and vomiting.  Genitourinary: Negative for dysuria.       Urinary retention  Musculoskeletal: Positive for myalgias.  Neurological: Negative for dizziness, speech change, focal weakness, seizures and headaches.    DRUG ALLERGIES:   Allergies  Allergen Reactions  . Acetaminophen-Codeine     Other reaction(s): Headache  . Rofecoxib Other (See Comments)    ulcers  . Tetracyclines & Related     VITALS:  Blood pressure 132/75, pulse (!) 103, temperature 98.4 F (36.9 C), temperature source Oral, resp. rate 18, height '5\' 11"'$  (1.803 m), weight 130.1 kg (286 lb 14.4 oz), SpO2 98 %.  PHYSICAL EXAMINATION:  Physical Exam  GENERAL:  70 y.o.-year-old obese patient lying in the bed with no acute distress.  EYES: Pupils equal, round, reactive to light and accommodation. No scleral icterus. Extraocular muscles intact.  HEENT: Head atraumatic, normocephalic. Oropharynx and nasopharynx clear.  NECK:  Supple, no jugular venous distention. No thyroid enlargement, no tenderness.    LUNGS: Normal breath sounds bilaterally, no wheezing, rales,rhonchi or crepitation. No use of accessory muscles of respiration. Decreased bibasilar breath sounds CARDIOVASCULAR: S1, S2 normal. No rubs, or gallops. 3/6 systolic murmur present. ABDOMEN: Soft, nontender, distended. Bowel sounds present. No organomegaly or mass.  EXTREMITIES: No pedal edema, cyanosis, or clubbing.  NEUROLOGIC: Cranial nerves II through XII are intact. Muscle strength 4/5 in all extremities. Global weakness noted. Sensation intact. Gait not checked. Following commands PSYCHIATRIC: The patient is alert but oriented only x 1-2.  SKIN: No obvious rash, lesion, or ulcer.    LABORATORY PANEL:   CBC  Recent Labs Lab 10/05/16 0431  WBC 6.1  HGB 11.4*  HCT 33.7*  PLT 105*   ------------------------------------------------------------------------------------------------------------------  Chemistries   Recent Labs Lab 10/02/16 1826  10/06/16 0539 10/07/16 0455  NA 131*  < > 136 138  K 3.2*  < > 3.3* 3.7  CL 100*  < > 103 106  CO2 20*  < > 25 26  GLUCOSE 221*  < > 108* 128*  BUN 13  < > 13 13  CREATININE 0.78  < > 0.72 0.57*  CALCIUM 7.5*  < > 8.1* 8.1*  MG 1.7  < > 2.2  --   AST 122*  --   --   --   ALT 59  --   --   --   ALKPHOS 66  --   --   --   BILITOT 0.7  --   --   --   < > = values in this interval not displayed. ------------------------------------------------------------------------------------------------------------------  Cardiac Enzymes  Recent Labs Lab  10/02/16 0816  TROPONINI 0.06*   ------------------------------------------------------------------------------------------------------------------  RADIOLOGY:  No results found.  EKG:   Orders placed or performed during the hospital encounter of 10/02/16  . ED EKG 12-Lead  . ED EKG 12-Lead  . EKG 12-Lead  . EKG 12-Lead  . EKG 12-Lead  . EKG 12-Lead    ASSESSMENT AND PLAN:   70 year old male with past medical  history significant for arthritis and chronic pain, hypertension, neuropathy, diabetes mellitus and coronary artery disease admitted to ICU for sepsis and acute respiratory failure. Patient has been extubated and off pressors and Precedex drip and so being transferred to floor.  #1 sepsis-secondary to MSSA bacteremia, likely source being pneumonia. -Appreciate ID consult. Repeat blood cultures from 10/05/2016 are negative, will order for PICC line. -Continue IV Ancef for 2 weeks at least if TEE negative for vegetations. -We will need SNIF placement  #2 altered mental status-likely metabolic encephalopathy. -CT of the head negative for acute findings. Check ammonia level. - LP done and showing no infection. Appreciate neuro consult, off precedex. Improving now. -Monitor closely. No focal deficits.  #3 Afib with rvr- off cardizem drip. HR controlled Change to oral meds. On xarelto  - also on amiodarone  #4 Urinary retention- tried removing foley, retaining urine. Foley placed back again last night - started flomax attempt removal tomorrow  #5 chronic pain syndrome- on methadone, gabapentin  #6 DVT prophylaxis-on Xarelto   Physical therapy recommended rehabilitation. Awaiting TEE.    All the records are reviewed and case discussed with Care Management/Social Workerr. Management plans discussed with the patient, family and they are in agreement.  CODE STATUS: Full Code  TOTAL TIME TAKING CARE OF THIS PATIENT: 38 minutes.   POSSIBLE D/C IN 1-2 DAYS, DEPENDING ON CLINICAL CONDITION.   Gladstone Lighter M.D on 10/07/2016 at 7:47 AM  Between 7am to 6pm - Pager - [phone]  After 6pm go to [email or web address] - password [email or web address]  Office  [phone]  CC: Primary care physician; Idelle Crouch, MD

## 2016-10-07 NOTE — Progress Notes (Signed)
Pt transferred over from ICU on day shift, they removed his foley, pt has been due to void & has yet to do so. We bladder scanned pt, pt has 1099m in his bladder, pt very drowsy and when asked he does not feel like he needs to urinate. Md paged. Dr. WJannifer Franklin to put in order to place another foley for urinary retention. Will wait for orders & place foley. Will continue to monitor. AConley Simmonds RN, BSN

## 2016-10-07 NOTE — Progress Notes (Signed)
[place] for Cefazolin Dosing, Electrolyte Replacement, Rivaroxaban doing.   Pharmacy consulted as above for 70 yo male admitted with acute respiratory - s/p extubation. Patient is now being treated for MSSA bacteremia and Afib with RVR.    Plan:   1. Cefazolin: continue cefazolin 2g IV Q8hr. Follow-up cultures obtained on 12/18. Will continue to follow.  2. Electrolyte Replacement: WNL. Will f/u labs in AM and consider checking QOD.  3. Rivaroxaban: Patient CHADsVASc is 3 (Age, HTN, Diabetes). Will continue rivaroxaban '20mg'$  daily with supper.   Allergies  Allergen Reactions  . Acetaminophen-Codeine     Other reaction(s): Headache  . Rofecoxib Other (See Comments)    ulcers  . Tetracyclines & Related     Patient Measurements: Height: '5\' 11"'$  (180.3 cm) Weight: 286 lb 14.4 oz (130.1 kg) IBW/kg (Calculated) : 75.3  Vital Signs: Temp: 97.8 F (36.6 C) (12/20 1156) Temp Source: Oral (12/20 1156) BP: 142/67 (12/20 1156) Pulse Rate: 90 (12/20 1156) Intake/Output from previous day: 12/19 0701 - 12/20 0700 In: 880 [P.O.:760; I.V.:20; IV Piggyback:100] Out: 1850 [Urine:1850] Intake/Output from this shift: Total I/O In: 120 [P.O.:120] Out: -   Labs:  Recent Labs  10/05/16 0431 10/06/16 0539 10/07/16 0455  WBC 6.1  --   --   HGB 11.4*  --   --   HCT 33.7*  --   --   PLT 105*  --   --   CREATININE 0.75 0.72 0.57*  MG 2.3 2.2  --   PHOS 2.9 3.1  --    Estimated Creatinine Clearance: 118.1 mL/min (by C-G formula based on SCr of 0.57 mg/dL (L)).   Microbiology: Recent Results (from the past 720 hour(s))  Blood Culture (routine x 2)     Status: Abnormal   Collection Time: 10/02/16  8:16 AM  Result Value Ref Range Status   Specimen Description BLOOD RIGHT ARM  Final   Special Requests   Final    BOTTLES DRAWN AEROBIC AND ANAEROBIC  AER 8CC ANA 10CC   Culture  Setup Time   Final    GRAM POSITIVE COCCI IN BOTH AEROBIC AND  ANAEROBIC BOTTLES CRITICAL VALUE NOTED.  VALUE IS CONSISTENT WITH PREVIOUSLY REPORTED AND CALLED VALUE.    Culture (A)  Final    STAPHYLOCOCCUS AUREUS SUSCEPTIBILITIES PERFORMED ON PREVIOUS CULTURE WITHIN THE LAST 5 DAYS. Performed at [facility]    Report Status 10/05/2016 FINAL  Final  Blood Culture (routine x 2)     Status: Abnormal   Collection Time: 10/02/16  8:16 AM  Result Value Ref Range Status   Specimen Description BLOOD LEFT ANTECUBITAL  Final   Special Requests   Final    BOTTLES DRAWN AEROBIC AND ANAEROBIC  AER 11CC ANA 12CC   Culture  Setup Time   Final    GRAM POSITIVE COCCI IN BOTH AEROBIC AND ANAEROBIC BOTTLES CRITICAL RESULT CALLED TO, READ BACK BY AND VERIFIED WITH: Carmin Richmond @ [phone] 10/02/16 by [facility] Performed at [place] (A)  Final   Report Status 10/04/2016 FINAL  Final   Organism ID, Bacteria STAPHYLOCOCCUS AUREUS  Final      Susceptibility   Staphylococcus aureus - MIC*    CIPROFLOXACIN <=0.5 SENSITIVE Sensitive     ERYTHROMYCIN 0.5 SENSITIVE Sensitive     GENTAMICIN <=0.5 SENSITIVE Sensitive     OXACILLIN 0.5 SENSITIVE Sensitive     TETRACYCLINE <=1 SENSITIVE Sensitive  VANCOMYCIN 1 SENSITIVE Sensitive     TRIMETH/SULFA <=10 SENSITIVE Sensitive     CLINDAMYCIN <=0.25 SENSITIVE Sensitive     RIFAMPIN <=0.5 SENSITIVE Sensitive     Inducible Clindamycin NEGATIVE Sensitive     * STAPHYLOCOCCUS AUREUS  Urine culture     Status: None   Collection Time: 10/02/16  8:16 AM  Result Value Ref Range Status   Specimen Description URINE, RANDOM  Final   Special Requests NONE  Final   Culture NO GROWTH Performed at [facility]   Final   Report Status 10/03/2016 FINAL  Final  Blood Culture ID Panel (Reflexed)     Status: Abnormal   Collection Time: 10/02/16  8:16 AM  Result Value Ref Range Status   Enterococcus species NOT DETECTED NOT DETECTED Final   Listeria monocytogenes NOT DETECTED NOT  DETECTED Final   Staphylococcus species DETECTED (A) NOT DETECTED Final    Comment: CRITICAL RESULT CALLED TO, READ BACK BY AND VERIFIED WITH: Carmin Richmond @ 7829 10/02/16 by [place]    Staphylococcus aureus DETECTED (A) NOT DETECTED Final    Comment: CRITICAL RESULT CALLED TO, READ BACK BY AND VERIFIED WITH: Carmin Richmond @ 5621 10/02/16 by [facility]    Methicillin resistance NOT DETECTED NOT DETECTED Final   Streptococcus species NOT DETECTED NOT DETECTED Final   Streptococcus agalactiae NOT DETECTED NOT DETECTED Final   Streptococcus pneumoniae NOT DETECTED NOT DETECTED Final   Streptococcus pyogenes NOT DETECTED NOT DETECTED Final   Acinetobacter baumannii NOT DETECTED NOT DETECTED Final   Enterobacteriaceae species NOT DETECTED NOT DETECTED Final   Enterobacter cloacae complex NOT DETECTED NOT DETECTED Final   Escherichia coli NOT DETECTED NOT DETECTED Final   Klebsiella oxytoca NOT DETECTED NOT DETECTED Final   Klebsiella pneumoniae NOT DETECTED NOT DETECTED Final   Proteus species NOT DETECTED NOT DETECTED Final   Serratia marcescens NOT DETECTED NOT DETECTED Final   Haemophilus influenzae NOT DETECTED NOT DETECTED Final   Neisseria meningitidis NOT DETECTED NOT DETECTED Final   Pseudomonas aeruginosa NOT DETECTED NOT DETECTED Final   Candida albicans NOT DETECTED NOT DETECTED Final   Candida glabrata NOT DETECTED NOT DETECTED Final   Candida krusei NOT DETECTED NOT DETECTED Final   Candida parapsilosis NOT DETECTED NOT DETECTED Final   Candida tropicalis NOT DETECTED NOT DETECTED Final  MRSA PCR Screening     Status: None   Collection Time: 10/02/16  2:40 PM  Result Value Ref Range Status   MRSA by PCR NEGATIVE NEGATIVE Final    Comment:        The GeneXpert MRSA Assay (FDA approved for NASAL specimens only), is one component of a comprehensive MRSA colonization surveillance program. It is not intended to diagnose MRSA infection nor to guide or monitor treatment for MRSA  infections.   CSF culture     Status: None   Collection Time: 10/02/16  3:13 PM  Result Value Ref Range Status   Specimen Description CSF  Final   Special Requests Normal  Final   Gram Stain   Final    RARE WBC SEEN RARE RED BLOOD CELLS NO ORGANISMS SEEN    Culture   Final    NO GROWTH 3 DAYS Performed at [facility]    Report Status 10/06/2016 FINAL  Final  Culture, fungus without smear     Status: None (Preliminary result)   Collection Time: 10/02/16  3:14 PM  Result Value Ref Range Status   Specimen Description CSF  Final  Special Requests Normal  Final   Culture   Final    NO FUNGUS ISOLATED AFTER 3 DAYS Performed at [facility]    Report Status PENDING  Incomplete  Fungus Culture With Stain     Status: None (Preliminary result)   Collection Time: 10/02/16  5:07 PM  Result Value Ref Range Status   Fungus Stain Final report  Final    Comment: (NOTE) Performed At: [facility] [place], [place] [id number] Lindon Romp MD VQ:[id number]    Fungus (Mycology) Culture PENDING  Incomplete   Fungal Source CSF  Final  Fungus Culture Result     Status: None   Collection Time: 10/02/16  5:07 PM  Result Value Ref Range Status   Result 1 Comment  Final    Comment: (NOTE) KOH/Calcofluor preparation:  no fungus observed. Performed At: [facility] [place], [place] [id number] Lindon Romp MD CZ:[id number]   Culture, expectorated sputum-assessment     Status: None   Collection Time: 10/02/16  6:18 PM  Result Value Ref Range Status   Specimen Description TRACHEAL ASPIRATE  Final   Special Requests NONE  Final   Sputum evaluation THIS SPECIMEN IS ACCEPTABLE FOR SPUTUM CULTURE  Final   Report Status 10/02/2016 FINAL  Final  Culture, respiratory (NON-Expectorated)     Status: None   Collection Time: 10/02/16  6:18 PM  Result Value Ref Range Status   Specimen Description TRACHEAL ASPIRATE  Final   Special  Requests NONE Reflexed from [id number]  Final   Gram Stain   Final    ABUNDANT WBC PRESENT, PREDOMINANTLY MONONUCLEAR FEW GRAM NEGATIVE COCCI IN PAIRS RARE GRAM POSITIVE RODS    Culture   Final    Consistent with normal respiratory flora. Performed at [facility]    Report Status 10/05/2016 FINAL  Final  Culture, blood (single) w Reflex to ID Panel     Status: None (Preliminary result)   Collection Time: 10/05/16  5:23 PM  Result Value Ref Range Status   Specimen Description BLOOD LEFT WRIST  Final   Special Requests   Final    BOTTLES DRAWN AEROBIC AND ANAEROBIC AER 11ML ANA 6ML   Culture NO GROWTH 2 DAYS  Final   Report Status PENDING  Incomplete    Medical History: Past Medical History:  Diagnosis Date  . Chronic pain   . Coronary artery disease   . Diabetes mellitus without complication ([place])   . DJD (degenerative joint disease)   . GERD (gastroesophageal reflux disease)   . Hyperlipemia   . Hypertension   . Insomnia   . Neuropathy [facility])     Pharmacy will continue to monitor and adjust per consult.   Ulice Dash D 10/07/2016,3:42 PM

## 2016-10-07 NOTE — Progress Notes (Signed)
Patient transferred to gen med floor, No active Pulmonarry or critical issues at this time Follow up ID recs  1.plan for PICC line if blood cultures negative 2.plan for TEE prior to discharge  Will sign off at this time. Please call back with any questions  Corrin Parker, M.D.  [facility]  Medical Director [place] Director [facility] Cardio-Pulmonary Department

## 2016-10-08 ENCOUNTER — Encounter
Admission: EM | Disposition: A | Discharge status: Skilled Nursing Facility | Payer: Self-pay | Source: Home / Self Care | Attending: Internal Medicine

## 2016-10-08 ENCOUNTER — Inpatient Hospital Stay
Admit: 2016-10-08 | Discharge: 2016-10-08 | Disposition: A | Discharge status: Home / Self Care | Payer: PPO | Attending: Cardiology | Admitting: Cardiology

## 2016-10-08 DIAGNOSIS — J9690 Respiratory failure, unspecified, unspecified whether with hypoxia or hypercapnia: Secondary | ICD-10-CM | POA: Diagnosis not present

## 2016-10-08 DIAGNOSIS — I330 Acute and subacute infective endocarditis: Secondary | ICD-10-CM | POA: Diagnosis not present

## 2016-10-08 DIAGNOSIS — I058 Other rheumatic mitral valve diseases: Secondary | ICD-10-CM | POA: Diagnosis not present

## 2016-10-08 DIAGNOSIS — I4891 Unspecified atrial fibrillation: Secondary | ICD-10-CM | POA: Diagnosis not present

## 2016-10-08 DIAGNOSIS — I10 Essential (primary) hypertension: Secondary | ICD-10-CM | POA: Diagnosis not present

## 2016-10-08 DIAGNOSIS — I25119 Atherosclerotic heart disease of native coronary artery with unspecified angina pectoris: Secondary | ICD-10-CM | POA: Diagnosis not present

## 2016-10-08 DIAGNOSIS — A4101 Sepsis due to Methicillin susceptible Staphylococcus aureus: Secondary | ICD-10-CM | POA: Diagnosis not present

## 2016-10-08 DIAGNOSIS — G9341 Metabolic encephalopathy: Secondary | ICD-10-CM | POA: Diagnosis not present

## 2016-10-08 HISTORY — PX: TEE WITHOUT CARDIOVERSION: SHX5443

## 2016-10-08 LAB — BASIC METABOLIC PANEL
Anion gap: 7 (ref 5–15)
BUN: 12 mg/dL (ref 6–20)
CHLORIDE: 106 mmol/L (ref 101–111)
CO2: 24 mmol/L (ref 22–32)
CREATININE: 0.55 mg/dL — AB (ref 0.61–1.24)
Calcium: 8.1 mg/dL — ABNORMAL LOW (ref 8.9–10.3)
GFR calc Af Amer: >60 mL/min (ref >60)
GFR calc non Af Amer: >60 mL/min (ref >60)
Glucose, Bld: 177 mg/dL — ABNORMAL HIGH (ref 65–99)
POTASSIUM: 4.4 mmol/L (ref 3.5–5.1)
SODIUM: 137 mmol/L (ref 135–145)

## 2016-10-08 LAB — AMMONIA: AMMONIA: 12 umol/L (ref 9–35)

## 2016-10-08 LAB — CBC
HEMATOCRIT: 36.3 % — AB (ref 40.0–52.0)
Hemoglobin: 12.4 g/dL — ABNORMAL LOW (ref 13.0–18.0)
MCH: 29.2 pg (ref 26.0–34.0)
MCHC: 34.3 g/dL (ref 32.0–36.0)
MCV: 85.1 fL (ref 80.0–100.0)
Platelets: 186 10*3/uL (ref 150–440)
RBC: 4.27 MIL/uL — ABNORMAL LOW (ref 4.40–5.90)
RDW: 16.2 % — AB (ref 11.5–14.5)
WBC: 9.5 10*3/uL (ref 3.8–10.6)

## 2016-10-08 LAB — PHOSPHORUS: Phosphorus: 3 mg/dL (ref 2.5–4.6)

## 2016-10-08 LAB — GLUCOSE, CAPILLARY
Glucose-Capillary: 170 mg/dL — ABNORMAL HIGH (ref 65–99)
Glucose-Capillary: 153 mg/dL — ABNORMAL HIGH (ref 65–99)
Glucose-Capillary: 144 mg/dL — ABNORMAL HIGH (ref 65–99)
Glucose-Capillary: 134 mg/dL — ABNORMAL HIGH (ref 65–99)

## 2016-10-08 LAB — MAGNESIUM: Magnesium: 1.8 mg/dL (ref 1.7–2.4)

## 2016-10-08 SURGERY — ECHOCARDIOGRAM, TRANSESOPHAGEAL
Anesthesia: Moderate Sedation

## 2016-10-08 MED ORDER — FENTANYL CITRATE (PF) 100 MCG/2ML IJ SOLN
INTRAMUSCULAR | Status: AC
  Filled 2016-10-08: qty 2

## 2016-10-08 MED ORDER — FENTANYL CITRATE (PF) 100 MCG/2ML IJ SOLN
INTRAMUSCULAR | Status: AC | PRN
  Administered 2016-10-08 (×4): 25 ug via INTRAVENOUS

## 2016-10-08 MED ORDER — MIDAZOLAM HCL 2 MG/2ML IJ SOLN
INTRAMUSCULAR | Status: AC | PRN
  Administered 2016-10-08 (×4): 1 mg via INTRAVENOUS

## 2016-10-08 MED ORDER — METHADONE HCL 10 MG PO TABS
5.0000 mg | ORAL_TABLET | Freq: Two times a day (BID) | ORAL | Status: DC
  Administered 2016-10-08 – 2016-10-09 (×3): 5 mg via ORAL
  Filled 2016-10-08 (×3): qty 1

## 2016-10-08 MED ORDER — BUTAMBEN-TETRACAINE-BENZOCAINE 2-2-14 % EX AERO
INHALATION_SPRAY | CUTANEOUS | Status: AC
  Filled 2016-10-08: qty 20

## 2016-10-08 MED ORDER — LIDOCAINE VISCOUS 2 % MT SOLN
OROMUCOSAL | Status: AC
  Filled 2016-10-08: qty 15

## 2016-10-08 MED ORDER — SODIUM CHLORIDE 0.9 % IV SOLN: INTRAVENOUS | Status: DC

## 2016-10-08 MED ORDER — MIDAZOLAM HCL 5 MG/5ML IJ SOLN
INTRAMUSCULAR | Status: AC
  Filled 2016-10-08: qty 5

## 2016-10-08 NOTE — Progress Notes (Signed)
Mr.  [name] has been admitted to [facility] on October 02 2016 for an acute medical condition. His condition is still critical and is still being treated here as of today.  So please excuse him for not attending the court date.  Please call if any questions.   Thank you.  Gladstone Lighter, M.D [facility] [address] [place], [place] [address] Ph: [phone]

## 2016-10-08 NOTE — Progress Notes (Signed)
Nutrition Follow-up  DOCUMENTATION CODES:   Obesity unspecified  INTERVENTION:  1. Diet advancement per MD/NP/PA 2. Discussed ONS following patient's diet advancement with family. Order either Ensure or Boost Breeze depending on diet ordered.  NUTRITION DIAGNOSIS:   Inadequate oral intake related to inability to eat as evidenced by NPO status. -ongoing  GOAL:   Patient will meet greater than or equal to 90% of their needs -new goal  MONITOR:   PO intake, Diet advancement, Supplement acceptance, I & O's, Labs  ASSESSMENT:   69 year old male with acute respiratory failure, possible secondary to pneumonia and/or ARDS, complicating acute mental status changes, possible meningitis.  Spoke with pt's family this morning. He continues to be confused - lethargic Was eating ok yesterday, NPO for TEE at this time. Discussed ONS options with family following advancement Family states all patient does is wake up briefly, eat and sleep right now.  Labs and medications reviewed: CBGs 150-173, MVI w/ minerals, Miralax, Senokot-S NS @ 64m/hr  Diet Order:  Diet NPO time specified  Skin:  Wound (see comment) (Stg II PU to Buttocks)  Last BM:  PTA  Height:   Ht Readings from Last 1 Encounters:  10/02/16 '5\' 11"'$  (1.803 m)    Weight:   Wt Readings from Last 1 Encounters:  10/08/16 286 lb 1.6 oz (129.8 kg)    Ideal Body Weight:  78.18 kg  BMI:  Body mass index is 39.9 kg/m.  Estimated Nutritional Needs:   Kcal:  2000 calories  Protein:  126-152 gm  Fluid:  >/= 2L  EDUCATION NEEDS:   No education needs identified at this time  WSatira Anis Jermya Dowding, MS, RD LDN Inpatient Clinical Dietitian Pager [phone]

## 2016-10-08 NOTE — Progress Notes (Signed)
*  PRELIMINARY RESULTS* Echocardiogram Echocardiogram Transesophageal has been performed.  Sherrie Sport 10/08/2016, 12:45 PM

## 2016-10-08 NOTE — Clinical Social Work Note (Signed)
CSW spoke to patient's wife Joycelyn Schmid who was at bedside.  Patient's wife is agreeable to having patient go to SNF for short term rehab and iv antibiotics.  Patient's wife gave CSW permission to begin bed search, formal assessment to follow.  Jones Broom. [place], MSW, [place]  Mon-Fri 8a-4:30p 10/08/2016 5:07 PM

## 2016-10-08 NOTE — Progress Notes (Signed)
[place] for Cefazolin Dosing,  Electrolyte Replacement, and Rivaroxaban Dosing  Pharmacy consulted as above for 70 yo male admitted with acute respiratory - s/p extubation. Patient is now being treated for MSSA bacteremia and Afib with RVR.    Plan:   1. Cefazolin: continue cefazolin 2g IV Q8hr. Follow-up cultures obtained on 12/18. Will continue to follow.  2. Electrolyte Replacement: WNL. Will recheck labs in 48 hours.  3. Rivaroxaban: Patient CHADsVASc is 3 (Age, HTN, Diabetes). Will continue rivaroxaban '20mg'$  daily with supper.   Allergies  Allergen Reactions  . Acetaminophen-Codeine     Other reaction(s): Headache  . Rofecoxib Other (See Comments)    ulcers  . Tetracyclines & Related     Patient Measurements: Height: '5\' 11"'$  (180.3 cm) Weight: 286 lb 1.6 oz (129.8 kg) IBW/kg (Calculated) : 75.3  Vital Signs: Temp: 98.4 F (36.9 C) (12/21 1108) Temp Source: Oral (12/21 0352) BP: 98/64 (12/21 1250) Pulse Rate: 100 (12/21 1250) Intake/Output from previous day: 12/20 0701 - 12/21 0700 In: 620 [P.O.:120; IV Piggyback:500] Out: 2150 [Urine:2150] Intake/Output from this shift: No intake/output data recorded.  Labs:  Recent Labs     10/08/16  0252  K  4.4  ]   Recent Labs  10/06/16 0539 10/07/16 0455 10/08/16 0252  WBC  --   --  9.5  HGB  --   --  12.4*  HCT  --   --  36.3*  PLT  --   --  186  CREATININE 0.72 0.57* 0.55*  MG 2.2  --  1.8  PHOS 3.1  --  3.0   Estimated Creatinine Clearance: 118 mL/min (by C-G formula based on SCr of 0.55 mg/dL (L)).   Microbiology: Recent Results (from the past 720 hour(s))  Blood Culture (routine x 2)     Status: Abnormal   Collection Time: 10/02/16  8:16 AM  Result Value Ref Range Status   Specimen Description BLOOD RIGHT ARM  Final   Special Requests   Final    BOTTLES DRAWN AEROBIC AND ANAEROBIC  AER 8CC ANA 10CC   Culture  Setup Time   Final    GRAM POSITIVE COCCI IN  BOTH AEROBIC AND ANAEROBIC BOTTLES CRITICAL VALUE NOTED.  VALUE IS CONSISTENT WITH PREVIOUSLY REPORTED AND CALLED VALUE.    Culture (A)  Final    STAPHYLOCOCCUS AUREUS SUSCEPTIBILITIES PERFORMED ON PREVIOUS CULTURE WITHIN THE LAST 5 DAYS. Performed at [facility]    Report Status 10/05/2016 FINAL  Final  Blood Culture (routine x 2)     Status: Abnormal   Collection Time: 10/02/16  8:16 AM  Result Value Ref Range Status   Specimen Description BLOOD LEFT ANTECUBITAL  Final   Special Requests   Final    BOTTLES DRAWN AEROBIC AND ANAEROBIC  AER 11CC ANA 12CC   Culture  Setup Time   Final    GRAM POSITIVE COCCI IN BOTH AEROBIC AND ANAEROBIC BOTTLES CRITICAL RESULT CALLED TO, READ BACK BY AND VERIFIED WITH: Carmin Richmond @ [phone] 10/02/16 by [facility] Performed at [place] (A)  Final   Report Status 10/04/2016 FINAL  Final   Organism ID, Bacteria STAPHYLOCOCCUS AUREUS  Final      Susceptibility   Staphylococcus aureus - MIC*    CIPROFLOXACIN <=0.5 SENSITIVE Sensitive     ERYTHROMYCIN 0.5 SENSITIVE Sensitive     GENTAMICIN <=0.5 SENSITIVE Sensitive     OXACILLIN 0.5 SENSITIVE Sensitive  TETRACYCLINE <=1 SENSITIVE Sensitive     VANCOMYCIN 1 SENSITIVE Sensitive     TRIMETH/SULFA <=10 SENSITIVE Sensitive     CLINDAMYCIN <=0.25 SENSITIVE Sensitive     RIFAMPIN <=0.5 SENSITIVE Sensitive     Inducible Clindamycin NEGATIVE Sensitive     * STAPHYLOCOCCUS AUREUS  Urine culture     Status: None   Collection Time: 10/02/16  8:16 AM  Result Value Ref Range Status   Specimen Description URINE, RANDOM  Final   Special Requests NONE  Final   Culture NO GROWTH Performed at [facility]   Final   Report Status 10/03/2016 FINAL  Final  Blood Culture ID Panel (Reflexed)     Status: Abnormal   Collection Time: 10/02/16  8:16 AM  Result Value Ref Range Status   Enterococcus species NOT DETECTED NOT DETECTED Final   Listeria monocytogenes  NOT DETECTED NOT DETECTED Final   Staphylococcus species DETECTED (A) NOT DETECTED Final    Comment: CRITICAL RESULT CALLED TO, READ BACK BY AND VERIFIED WITH: Carmin Richmond @ 2355 10/02/16 by [place]    Staphylococcus aureus DETECTED (A) NOT DETECTED Final    Comment: CRITICAL RESULT CALLED TO, READ BACK BY AND VERIFIED WITH: Carmin Richmond @ [phone] 10/02/16 by [facility]    Methicillin resistance NOT DETECTED NOT DETECTED Final   Streptococcus species NOT DETECTED NOT DETECTED Final   Streptococcus agalactiae NOT DETECTED NOT DETECTED Final   Streptococcus pneumoniae NOT DETECTED NOT DETECTED Final   Streptococcus pyogenes NOT DETECTED NOT DETECTED Final   Acinetobacter baumannii NOT DETECTED NOT DETECTED Final   Enterobacteriaceae species NOT DETECTED NOT DETECTED Final   Enterobacter cloacae complex NOT DETECTED NOT DETECTED Final   Escherichia coli NOT DETECTED NOT DETECTED Final   Klebsiella oxytoca NOT DETECTED NOT DETECTED Final   Klebsiella pneumoniae NOT DETECTED NOT DETECTED Final   Proteus species NOT DETECTED NOT DETECTED Final   Serratia marcescens NOT DETECTED NOT DETECTED Final   Haemophilus influenzae NOT DETECTED NOT DETECTED Final   Neisseria meningitidis NOT DETECTED NOT DETECTED Final   Pseudomonas aeruginosa NOT DETECTED NOT DETECTED Final   Candida albicans NOT DETECTED NOT DETECTED Final   Candida glabrata NOT DETECTED NOT DETECTED Final   Candida krusei NOT DETECTED NOT DETECTED Final   Candida parapsilosis NOT DETECTED NOT DETECTED Final   Candida tropicalis NOT DETECTED NOT DETECTED Final  MRSA PCR Screening     Status: None   Collection Time: 10/02/16  2:40 PM  Result Value Ref Range Status   MRSA by PCR NEGATIVE NEGATIVE Final    Comment:        The GeneXpert MRSA Assay (FDA approved for NASAL specimens only), is one component of a comprehensive MRSA colonization surveillance program. It is not intended to diagnose MRSA infection nor to guide or monitor  treatment for MRSA infections.   CSF culture     Status: None   Collection Time: 10/02/16  3:13 PM  Result Value Ref Range Status   Specimen Description CSF  Final   Special Requests Normal  Final   Gram Stain   Final    RARE WBC SEEN RARE RED BLOOD CELLS NO ORGANISMS SEEN    Culture   Final    NO GROWTH 3 DAYS Performed at [facility]    Report Status 10/06/2016 FINAL  Final  Culture, fungus without smear     Status: None (Preliminary result)   Collection Time: 10/02/16  3:14 PM  Result Value Ref Range  Status   Specimen Description CSF  Final   Special Requests Normal  Final   Culture   Final    NO FUNGUS ISOLATED AFTER 3 DAYS Performed at [facility]    Report Status PENDING  Incomplete  Fungus Culture With Stain     Status: None (Preliminary result)   Collection Time: 10/02/16  5:07 PM  Result Value Ref Range Status   Fungus Stain Final report  Final    Comment: (NOTE) Performed At: [facility] [place], [place] [id number] Lindon Romp MD [id number]    Fungus (Mycology) Culture PENDING  Incomplete   Fungal Source CSF  Final  Fungus Culture Result     Status: None   Collection Time: 10/02/16  5:07 PM  Result Value Ref Range Status   Result 1 Comment  Final    Comment: (NOTE) KOH/Calcofluor preparation:  no fungus observed. Performed At: [facility] [place], [place] [id number] Lindon Romp MD YT:[id number]   Culture, expectorated sputum-assessment     Status: None   Collection Time: 10/02/16  6:18 PM  Result Value Ref Range Status   Specimen Description TRACHEAL ASPIRATE  Final   Special Requests NONE  Final   Sputum evaluation THIS SPECIMEN IS ACCEPTABLE FOR SPUTUM CULTURE  Final   Report Status 10/02/2016 FINAL  Final  Culture, respiratory (NON-Expectorated)     Status: None   Collection Time: 10/02/16  6:18 PM  Result Value Ref Range Status   Specimen Description TRACHEAL ASPIRATE   Final   Special Requests NONE Reflexed from [id number]  Final   Gram Stain   Final    ABUNDANT WBC PRESENT, PREDOMINANTLY MONONUCLEAR FEW GRAM NEGATIVE COCCI IN PAIRS RARE GRAM POSITIVE RODS    Culture   Final    Consistent with normal respiratory flora. Performed at [facility]    Report Status 10/05/2016 FINAL  Final  Culture, blood (single) w Reflex to ID Panel     Status: None (Preliminary result)   Collection Time: 10/05/16  5:23 PM  Result Value Ref Range Status   Specimen Description BLOOD LEFT WRIST  Final   Special Requests   Final    BOTTLES DRAWN AEROBIC AND ANAEROBIC AER 11ML ANA 6ML   Culture NO GROWTH 3 DAYS  Final   Report Status PENDING  Incomplete    Medical History: Past Medical History:  Diagnosis Date  . Chronic pain   . Coronary artery disease   . Diabetes mellitus without complication ([place])   . DJD (degenerative joint disease)   . GERD (gastroesophageal reflux disease)   . Hyperlipemia   . Hypertension   . Insomnia   . Neuropathy [facility])     Pharmacy will continue to monitor and adjust per consult.   Ulice Dash D 10/08/2016,1:28 PM

## 2016-10-08 NOTE — Progress Notes (Signed)
[place] [facility] [place] NAME: [name]    MR#:  [id number]  DATE OF BIRTH:  Sep 30, 1946  SUBJECTIVE:  CHIEF COMPLAINT:   Chief Complaint  Patient presents with  . Altered Mental Status  . Code Sepsis   - Patient transferred from ICU- admitted with sepsis, MSSA bacteremia - for TEE and PICC line placement today - remains confused and sleepy. - family at bedside  REVIEW OF SYSTEMS:  Review of Systems  Constitutional: Positive for malaise/fatigue. Negative for chills and fever.  HENT: Negative for ear discharge, ear pain, hearing loss and nosebleeds.   Eyes: Negative for blurred vision and double vision.  Respiratory: Negative for cough, shortness of breath and wheezing.   Cardiovascular: Negative for chest pain, palpitations and leg swelling.  Gastrointestinal: Negative for abdominal pain, constipation, diarrhea, nausea and vomiting.  Genitourinary: Negative for dysuria.       Urinary retention  Musculoskeletal: Positive for myalgias.  Neurological: Negative for dizziness, speech change, focal weakness, seizures and headaches.    DRUG ALLERGIES:   Allergies  Allergen Reactions  . Acetaminophen-Codeine     Other reaction(s): Headache  . Rofecoxib Other (See Comments)    ulcers  . Tetracyclines & Related     VITALS:  Blood pressure 130/73, pulse 83, temperature 98.3 F (36.8 C), temperature source Oral, resp. rate 20, height '5\' 11"'$  (1.803 m), weight 129.8 kg (286 lb 1.6 oz), SpO2 95 %.  PHYSICAL EXAMINATION:  Physical Exam  GENERAL:  70 y.o.-year-old obese patient lying in the bed with no acute distress. arousable to light and verbal stimulation. EYES: Pupils equal, round, reactive to light and accommodation. No scleral icterus. Extraocular muscles intact.  HEENT: Head atraumatic, normocephalic. Oropharynx and nasopharynx clear.  NECK:  Supple, no jugular venous distention. No thyroid enlargement, no tenderness.  LUNGS:  Normal breath sounds bilaterally, no wheezing, rales,rhonchi or crepitation. No use of accessory muscles of respiration. Decreased bibasilar breath sounds CARDIOVASCULAR: S1, S2 normal. No rubs, or gallops. 3/6 systolic murmur present. ABDOMEN: Soft, nontender, distended. Bowel sounds present. No organomegaly or mass.  Foley catheter present. EXTREMITIES: No pedal edema, cyanosis, or clubbing.  NEUROLOGIC: Cranial nerves II through XII are intact. Muscle strength 4/5 in all extremities. Global weakness noted. Sensation intact. Gait not checked. Following commands PSYCHIATRIC: The patient is alert but oriented only x 1-2.  SKIN: No obvious rash, lesion, or ulcer.    LABORATORY PANEL:   CBC  Recent Labs Lab 10/08/16 0252  WBC 9.5  HGB 12.4*  HCT 36.3*  PLT 186   ------------------------------------------------------------------------------------------------------------------  Chemistries   Recent Labs Lab 10/02/16 1826  10/08/16 0252  NA 131*  < > 137  K 3.2*  < > 4.4  CL 100*  < > 106  CO2 20*  < > 24  GLUCOSE 221*  < > 177*  BUN 13  < > 12  CREATININE 0.78  < > 0.55*  CALCIUM 7.5*  < > 8.1*  MG 1.7  < > 1.8  AST 122*  --   --   ALT 59  --   --   ALKPHOS 66  --   --   BILITOT 0.7  --   --   < > = values in this interval not displayed. ------------------------------------------------------------------------------------------------------------------  Cardiac Enzymes  Recent Labs Lab 10/02/16 0816  TROPONINI 0.06*   ------------------------------------------------------------------------------------------------------------------  RADIOLOGY:  No results found.  EKG:   Orders placed or performed during the hospital  encounter of 10/02/16  . ED EKG 12-Lead  . ED EKG 12-Lead  . EKG 12-Lead  . EKG 12-Lead  . EKG 12-Lead  . EKG 12-Lead    ASSESSMENT AND PLAN:   70 year old male with past medical history significant for arthritis and chronic pain,  hypertension, neuropathy, diabetes mellitus and coronary artery disease admitted to ICU for sepsis and acute respiratory failure. Patient has been extubated and off pressors and Precedex drip and so being transferred to floor.  #1 sepsis-secondary to MSSA bacteremia, likely source being pneumonia. -Appreciate ID consult. Repeat blood cultures from 10/05/2016 are negative,  for PICC line today. -Continue IV Ancef for 2 weeks at least if TEE negative for vegetations. Scheduled for TEE today -We will need SNF placement  #2 altered mental status-likely toxic metabolic encephalopathy. -CT of the head negative for acute findings while in ICU. Check ammonia level. - LP done and showing no infection. Appreciate neuro consult, off precedex.  -Decrease methadone dose and also discontinue his gabapentin for now -Monitor closely. No focal deficits.  #3 Afib with rvr- off cardizem drip. HR elevated Added oral metoprolol. On xarelto  -Echo with diastolic dysfunction, grade 2. EF of 60%. Also has bioprosthetic aortic valve - also on amiodarone  #4 Urinary retention- tried removing foley, retaining urine. Foley placed back again- monitor today and discontinue tomorrow for a voiding trial - started flomax attempt removal tomorrow  #5 chronic pain syndrome- on methadone- dose reduced for now. Gabapentin has been discontinued today  #6 DVT prophylaxis-on Xarelto   Physical therapy recommended rehabilitation. Once mental status improves, can be discharged to rehabilitation. Social worker consulted    All the records are reviewed and case discussed with Care Management/Social Workerr. Management plans discussed with the patient, family and they are in agreement.  CODE STATUS: Full Code  TOTAL TIME TAKING CARE OF THIS PATIENT: 38 minutes.   POSSIBLE D/C IN 1-2 DAYS, DEPENDING ON CLINICAL CONDITION.   Gladstone Lighter M.D on 10/08/2016 at 9:15 AM  Between 7am to 6pm - Pager -  [phone]  After 6pm go to [email or web address] - password [facility]  Office  [phone]  CC: Primary care physician; Idelle Crouch, MD

## 2016-10-08 NOTE — Consult Note (Signed)
[facility] Consultation Note  Patient ID: [name], MRN: [id number], DOB/AGE: 05/01/1946 70 y.o. Admit date: 10/02/2016   Date of Consult: 10/08/2016 Primary Physician: Idelle Crouch, MD Primary Cardiologist: Nehemiah Massed  Chief Complaint:  Chief Complaint  Patient presents with  . Altered Mental Status  . Code Sepsis   Reason for Consult: endocarditis  HPI: 70 y.o. male with known coronary artery disease status post previous aortic valve stenosis and aortic valve replacement in the past with essential hypertension coronary atherosclerosis diabetes with complication coming in with respiratory failure and ARDS as well as what appears to be pneumonia. The patient is slowly improve from this but has had significant issues with bacteremia. This is been somewhat difficult to treat and therefore is concerning for possible endocarditis. 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 The patient has had otherwise stable hyperlipidemia reasonable heart rate control intermittent atrial fibrillation. He also is on anticoagulation at this time  Past Medical History:  Diagnosis Date  . Chronic pain   . Coronary artery disease   . Diabetes mellitus without complication ([place])   . DJD (degenerative joint disease)   . GERD (gastroesophageal reflux disease)   . Hyperlipemia   . Hypertension   . Insomnia   . Neuropathy [facility])       Surgical History:  Past Surgical History:  Procedure Laterality Date  . BACK SURGERY    . CARDIAC SURGERY    . JOINT REPLACEMENT       Home Meds: Prior to Admission medications   Medication Sig Start Date End Date Taking? Authorizing Provider  aspirin EC 81 MG tablet Take 1  tablet by mouth daily.   Yes Historical Provider, MD  Cyanocobalamin (RA VITAMIN B-12 TR) 1000 MCG TBCR Take 1 tablet by mouth daily.   Yes Historical Provider, MD  cyclobenzaprine (FLEXERIL) 10 MG tablet Take 1 tablet by mouth daily.   Yes Historical Provider, MD  DULoxetine (CYMBALTA) 60 MG capsule Take 1 capsule by mouth at bedtime. 07/29/15  Yes Historical Provider, MD  furosemide (LASIX) 40 MG tablet Take 1 tablet by mouth 2 (two) times daily. 03/19/16  Yes Historical Provider, MD  gabapentin (NEURONTIN) 600 MG tablet Take 1 tablet by mouth 3 (three) times daily. 11/21/12  Yes Historical Provider, MD  lisinopril (PRINIVIL,ZESTRIL) 10 MG tablet Take 1 tablet by mouth daily. 02/10/16  Yes Historical Provider, MD  Loratadine 10 MG CAPS Take 1 capsule by mouth daily.   Yes Historical Provider, MD  meclizine (ANTIVERT) 25 MG tablet Take 1 tablet by mouth 3 (three) times daily. 03/19/16  Yes Historical Provider, MD  metFORMIN (GLUCOPHAGE) 1000 MG tablet Take 1 tablet by mouth 2 (two) times daily. With meals. 03/19/16  Yes Historical Provider, MD  Multiple Vitamin (MULTI-VITAMINS) TABS Take 1 tablet by mouth daily.   Yes Historical Provider, MD  omeprazole (PRILOSEC) 20 MG capsule Take 20 mg by mouth daily.   Yes Historical Provider, MD  Oxcarbazepine (TRILEPTAL) 300 MG tablet Take 1 tablet by mouth 2 (two) times daily. 12/05/12  Yes Historical Provider, MD  pravastatin (PRAVACHOL) 40 MG tablet Take 1 tablet by mouth at bedtime. 02/10/16  Yes Historical Provider, MD  methadone (DOLOPHINE) 5 MG tablet Take 1 tablet by mouth 3 (three) times daily. 09/02/16   Historical  Provider, MD  metoprolol tartrate (LOPRESSOR) 25 MG tablet Take 1 tablet by mouth 2 (two) times daily. 07/20/16   Historical Provider, MD    Inpatient Medications:  . ALPRAZolam  0.5 mg Oral TID  . amiodarone  200 mg Oral Daily  . butamben-tetracaine-benzocaine      .  ceFAZolin (ANCEF) IV  2 g Intravenous Q8H  . chlorhexidine gluconate  (MEDLINE KIT)  15 mL Mouth Rinse BID  . ciprofloxacin  1 drop Both Eyes Q4H  . DULoxetine  60 mg Oral Daily  . famotidine  20 mg Oral BID  . fentaNYL      . insulin aspart  0-15 Units Subcutaneous TID WC  . insulin aspart  0-5 Units Subcutaneous QHS  . ipratropium-albuterol  3 mL Nebulization TID  . lidocaine      . methadone  5 mg Oral Q12H  . metoprolol tartrate  25 mg Oral TID  . midazolam      . multivitamins with iron  1 tablet Oral Daily  . OXcarbazepine  300 mg Oral BID  . polyethylene glycol  17 g Oral Daily  . pravastatin  40 mg Oral q1800  . rivaroxaban  20 mg Oral Q supper  . senna-docusate  1 tablet Oral BID  . tamsulosin  0.4 mg Oral Daily   . sodium chloride    . sodium chloride      Allergies:  Allergies  Allergen Reactions  . Acetaminophen-Codeine     Other reaction(s): Headache  . Rofecoxib Other (See Comments)    ulcers  . Tetracyclines & Related     Social History   Social History  . Marital status: Married    Spouse name: N/A  . Number of children: N/A  . Years of education: N/A   Occupational History  . Not on file.   Social History Main Topics  . Smoking status: Never Smoker  . Smokeless tobacco: Never Used  . Alcohol use No  . Drug use: Unknown  . Sexual activity: Not on file   Other Topics Concern  . Not on file   Social History Narrative  . No narrative on file     No family history on file.   Review of Systems Positive for Shortness of breath and weakness fatigue and cough congestion Negative for: General:  chills, fever, night sweats or weight changes.  Cardiovascular: PND orthopnea syncope dizziness  Dermatological skin lesions rashes Respiratory: Positive for Cough congestion Urologic: Frequent urination urination at night and hematuria Abdominal: negative for nausea, vomiting, diarrhea, bright red blood per rectum, melena, or hematemesis Neurologic: negative for visual changes, and/or hearing changes  All other  systems reviewed and are otherwise negative except as noted above.  Labs: No results for input(s): CKTOTAL, CKMB, TROPONINI in the last 72 hours. Lab Results  Component Value Date   WBC 9.5 10/08/2016   HGB 12.4 (L) 10/08/2016   HCT 36.3 (L) 10/08/2016   MCV 85.1 10/08/2016   PLT 186 10/08/2016    Recent Labs Lab 10/02/16 1826  10/08/16 0252  NA 131*  < > 137  K 3.2*  < > 4.4  CL 100*  < > 106  CO2 20*  < > 24  BUN 13  < > 12  CREATININE 0.78  < > 0.55*  CALCIUM 7.5*  < > 8.1*  PROT 6.9  --   --   BILITOT 0.7  --   --   ALKPHOS 66  --   --  ALT 59  --   --   AST 122*  --   --   GLUCOSE 221*  < > 177*  < > = values in this interval not displayed. No results found for: CHOL, HDL, LDLCALC, TRIG No results found for: DDIMER  Radiology/Studies:  Dg Chest 1 View  Result Date: 10/04/2016 CLINICAL DATA:  Dyspnea. EXAM: CHEST 1 VIEW COMPARISON:  10/03/2016. FINDINGS: Endotracheal tube in satisfactory position. Nasogastric tube extending into the stomach. Prosthetic heart valve. Cervical spine fixation hardware and thoracic spine degenerative changes. Stable mildly enlarged cardiac silhouette and prominence of the interstitial markings. Left basilar opacity with a more patchy appearance today. IMPRESSION: Mildly increased left basilar pneumonia or patchy atelectasis. Stable mild cardiomegaly and chronic interstitial lung disease. Electronically Signed   By: Claudie Revering M.D.   On: 10/04/2016 07:36   Ct Head Wo Contrast  Result Date: 10/02/2016 CLINICAL DATA:  Shortness of Breath, fall, altered mental status EXAM: CT HEAD WITHOUT CONTRAST TECHNIQUE: Contiguous axial images were obtained from the base of the skull through the vertex without intravenous contrast. COMPARISON:  06/18/2015 FINDINGS: Brain: No intracranial hemorrhage, mass effect or midline shift. Stable atrophy and chronic white matter disease. No definite acute cortical infarction. No mass lesion is noted on this  unenhanced scan. Vascular: Mild atherosclerotic calcifications of carotid siphon. Skull: No skull fracture is noted. Sinuses/Orbits: There is mucosal thickening with small air-fluid level right maxillary sinus. Mucosal thickening noted bilateral ethmoid air cells. Partially visualized NG tube. Other: None IMPRESSION: No acute intracranial abnormality. No definite acute cortical infarction. Stable atrophy and chronic white matter disease. Paranasal sinuses disease as described above. Electronically Signed   By: Lahoma Crocker M.D.   On: 10/02/2016 12:30   Ct Angio Chest Pe W And/or Wo Contrast  Result Date: 10/02/2016 CLINICAL DATA:  Increased shortness of breath and respiratory distress. Altered mental status. Urinary incontinence. EXAM: CT ANGIOGRAPHY CHEST WITH CONTRAST TECHNIQUE: Multidetector CT imaging of the chest was performed using the standard protocol during bolus administration of intravenous contrast. Multiplanar CT image reconstructions and MIPs were obtained to evaluate the vascular anatomy. CONTRAST:  75 cc Isovue 370. COMPARISON:  None. FINDINGS: Cardiovascular: Image quality is rather degraded by respiratory motion making assessment of the segmental and subsegmental pulmonary arteries difficult. No central or lobar pulmonary embolus. Atherosclerotic calcification of the arterial vasculature, including coronary arteries. Heart is mildly enlarged. No pericardial effusion. Mediastinum/Nodes: No pathologically enlarged mediastinal, hilar or axillary lymph nodes. Nasogastric tube is seen in the esophagus, extending into the stomach. Lungs/Pleura: Image quality is degraded by respiratory motion. Endotracheal tube terminates approximately 1 cm above the carina. There is bilateral lower lobe collapse/consolidation. No pleural fluid. Airway is otherwise unremarkable. Upper Abdomen: There is a great deal of artifact in the posterior upper abdomen due to the patient's arms. Visualized portions of the liver,  adrenal glands, kidneys, spleen, pancreas, stomach and bowel are grossly unremarkable. Nasogastric terminates in the stomach. Musculoskeletal: No worrisome lytic or sclerotic lesions. Degenerative changes are seen in the spine. Review of the MIP images confirms the above findings. IMPRESSION: 1. Image quality is degraded by respiratory motion, limiting evaluation for segmental and subsegmental pulmonary emboli. No central or lobar pulmonary embolus. 2. Endotracheal tube terminates approximately 1 cm above the carina. Consider retracting approximately 2-3 cm for better positioning, as clinically indicated. 3. Bilateral lower lobe dependent collapse/consolidation, the majority of which likely represents atelectasis. Difficult to exclude aspiration. 4. Aortic atherosclerosis (ICD10-170.0). Coronary artery calcification. Electronically Signed  By: Lorin Picket M.D.   On: 10/02/2016 12:32   Dg Chest Port 1 View  Result Date: 10/03/2016 CLINICAL DATA:  Shortness of Breath EXAM: PORTABLE CHEST 1 VIEW COMPARISON:  10/02/2016 FINDINGS: Endotracheal tube and NG tube remain in place, unchanged. Prior median sternotomy. Heart is borderline in size. Left lower lobe airspace opacity mildly increased since prior study. No confluent opacity on the right. No visible effusion. IMPRESSION: Increasing left lower lobe opacity concerning for pneumonia. Electronically Signed   By: Rolm Baptise M.D.   On: 10/03/2016 07:20   Dg Chest Portable 1 View  Result Date: 10/02/2016 CLINICAL DATA:  Status post intubation and nasogastric tube placement. Sepsis. EXAM: PORTABLE CHEST 1 VIEW COMPARISON:  Radiograph of same day. FINDINGS: Stable cardiomediastinal silhouette. Endotracheal tube is seen projected over tracheal air shadow with distal tip 2 cm above the carina. Nasogastric tube tip is seen entering stomach. No pneumothorax or significant pleural effusion is noted. Mild diffuse interstitial densities are noted concerning for  pulmonary edema. Atherosclerosis of thoracic aorta is noted. Bony thorax is unremarkable. IMPRESSION: Endotracheal and nasogastric tubes in grossly good position. Stable probable bilateral pulmonary edema. Electronically Signed   By: Marijo Conception, M.D.   On: 10/02/2016 10:53   Dg Chest Port 1 View  Result Date: 10/02/2016 CLINICAL DATA:  Altered mental status EXAM: PORTABLE CHEST 1 VIEW COMPARISON:  Mar 07, 2008 FINDINGS: There is mild interstitial edema. There is no airspace consolidation. Heart is mildly enlarged with mild pulmonary venous hypertension. Patient is status post median sternotomy. There is atherosclerotic calcification in the aorta. There is postoperative change in lower cervical spine region. IMPRESSION: Findings indicative of a degree of congestive heart failure. No airspace consolidation. Aortic atherosclerosis. Electronically Signed   By: Lowella Grip III M.D.   On: 10/02/2016 08:32   Dg Cyndy Freeze Guided Loc Of Needle/cath Tip For Spinal Inject Lt  Result Date: 10/02/2016 David A Martinique, MD     10/02/2016  5:25 PM Fluoro guded lumbar puncture at L3-$ level with acquisition of 3 cc clear colorless spinal fluid. See dictated Radiology report.   EKG: Atrial fibrillation with nonspecific ST and T wave changes  Weights: Filed Weights   10/06/16 0500 10/07/16 0431 10/08/16 0500  Weight: 129.6 kg (285 lb 11.5 oz) 130.1 kg (286 lb 14.4 oz) 129.8 kg (286 lb 1.6 oz)     Physical Exam: Blood pressure 98/64, pulse 100, temperature 98.4 F (36.9 C), resp. rate 18, height 5' 11" (1.803 m), weight 129.8 kg (286 lb 1.6 oz), SpO2 93 %. Body mass index is 39.9 kg/m. General: Well developed, well nourished, in no acute distress. Head eyes ears nose throat: Normocephalic, atraumatic, sclera non-icteric, no xanthomas, nares are without discharge. No apparent thyromegaly and/or mass  Lungs: Normal respiratory effort.  no wheezes, no rales, diffuse rhonchi.  Heart: Irregular with  normal S1 S2. no murmur gallop, no rub, PMI is normal size and placement, carotid upstroke normal without bruit, jugular venous pressure is normal Abdomen: Soft, non-tender, non-distended with normoactive bowel sounds. No hepatomegaly. No rebound/guarding. No obvious abdominal masses. Abdominal aorta is normal size without bruit Extremities: 1+ edema. no cyanosis, no clubbing, no ulcers  Peripheral : 2+ bilateral upper extremity pulses, 2+ bilateral femoral pulses, 2+ bilateral dorsal pedal pulse Neuro: Alert and oriented. No facial asymmetry. No focal deficit. Moves all extremities spontaneously. Musculoskeletal: Normal muscle tone without kyphosis Psych:  Responds to questions appropriately with a normal affect.    Assessment: 70 year old  male with known essential hypertension coronary atherosclerosis diabetes with complication atrial fibrillation with controlled ventricular rate and status post a previous aortic valve surgery with normally functioning aortic valve by echocardiogram but endocarditis of the mitral valve consistent with concerns of bacteremia  Plan: 1. Continue heart rate control of tachycardia with metoprolol with a goal heart rate between 60 and 90 bpm 2. Continue anticoagulation for further risk reduction in stroke with atrial fibrillation 3. High density cholesterol therapy with pravastatin 4. Further treatment of endocarditis with appropriate antibiotics as per infectious disease 5. Other cardiac diagnostic center intervention at this time  Signed, Corey Skains M.D. [facility] 10/08/2016, 12:59 PM

## 2016-10-08 NOTE — Progress Notes (Signed)
ID E note  TEE showed MV endocarditis (native valve)  [name] Senger Forsythis a 70 y.o.maleadmitted with sepsis and confusion. His LP is relatively unrevealing, CT chest with possible aspiration. BCX + GPC with MSSA Initially received empiric treatment for meningiits but CSF not consistent with this. I suspect he has staph bacteremia from the some abrasions on his legs after his fall Echo shows bioprosthetic valve, no veg but TEE shows MV vegetation  Repeat bcx pending 12/18.  Recommendations Cont  Ancef - will need 6 week course with stop date 6 weeks from 12/18

## 2016-10-08 NOTE — Progress Notes (Signed)
Infectious Disease Long Term IV Antibiotic Orders  Diagnosis: MSSA endocarditis  Culture results MSSA  Allergies:  Allergies  Allergen Reactions  . Acetaminophen-Codeine     Other reaction(s): Headache  . Rofecoxib Other (See Comments)    ulcers  . Tetracyclines & Related     Discharge antibiotics cefazolin      2 grams every  8 hours  PICC Care per protocol Labs weekly while on IV antibiotics      CBC w diff   Comprehensive met panel esr CRP   Planned duration of antibiotics  Stop date 11/16/16 Follow up clinic date  within 3 weeks  AX weekly labs to [phone]  Leonel Ramsay, MD

## 2016-10-09 ENCOUNTER — Encounter: Payer: Self-pay | Admitting: Cardiology

## 2016-10-09 DIAGNOSIS — I4891 Unspecified atrial fibrillation: Secondary | ICD-10-CM | POA: Diagnosis not present

## 2016-10-09 DIAGNOSIS — A4101 Sepsis due to Methicillin susceptible Staphylococcus aureus: Secondary | ICD-10-CM | POA: Diagnosis not present

## 2016-10-09 DIAGNOSIS — I25119 Atherosclerotic heart disease of native coronary artery with unspecified angina pectoris: Secondary | ICD-10-CM | POA: Diagnosis not present

## 2016-10-09 DIAGNOSIS — I330 Acute and subacute infective endocarditis: Secondary | ICD-10-CM | POA: Diagnosis not present

## 2016-10-09 DIAGNOSIS — I058 Other rheumatic mitral valve diseases: Secondary | ICD-10-CM | POA: Diagnosis not present

## 2016-10-09 DIAGNOSIS — I10 Essential (primary) hypertension: Secondary | ICD-10-CM | POA: Diagnosis not present

## 2016-10-09 LAB — BASIC METABOLIC PANEL
ANION GAP: 5 (ref 5–15)
BUN: 12 mg/dL (ref 6–20)
CALCIUM: 8 mg/dL — AB (ref 8.9–10.3)
CO2: 25 mmol/L (ref 22–32)
Chloride: 103 mmol/L (ref 101–111)
Creatinine, Ser: 0.6 mg/dL — ABNORMAL LOW (ref 0.61–1.24)
GFR calc Af Amer: >60 mL/min (ref >60)
GLUCOSE: 191 mg/dL — AB (ref 65–99)
Potassium: 4 mmol/L (ref 3.5–5.1)
Sodium: 133 mmol/L — ABNORMAL LOW (ref 135–145)

## 2016-10-09 LAB — GLUCOSE, CAPILLARY
GLUCOSE-CAPILLARY: 148 mg/dL — AB (ref 65–99)
GLUCOSE-CAPILLARY: 127 mg/dL — AB (ref 65–99)
GLUCOSE-CAPILLARY: 160 mg/dL — AB (ref 65–99)
Glucose-Capillary: 136 mg/dL — ABNORMAL HIGH (ref 65–99)
Glucose-Capillary: 163 mg/dL — ABNORMAL HIGH (ref 65–99)

## 2016-10-09 MED ORDER — RIVAROXABAN 20 MG PO TABS: 20.0000 mg | ORAL_TABLET | Freq: Every day | ORAL | 1 refills | Status: DC

## 2016-10-09 MED ORDER — TAMSULOSIN HCL 0.4 MG PO CAPS: 0.4000 mg | ORAL_CAPSULE | Freq: Every day | ORAL | 0 refills | Status: DC

## 2016-10-09 MED ORDER — CIPROFLOXACIN HCL 0.3 % OP SOLN: 1.0000 [drp] | OPHTHALMIC | 0 refills | Status: DC

## 2016-10-09 MED ORDER — METHADONE HCL 10 MG PO TABS
5.0000 mg | ORAL_TABLET | Freq: Three times a day (TID) | ORAL | Status: DC
Start: 2016-10-09 — End: 2016-10-10
  Administered 2016-10-09 – 2016-10-10 (×3): 5 mg via ORAL
  Filled 2016-10-09 (×3): qty 1

## 2016-10-09 MED ORDER — ACETAMINOPHEN 325 MG PO TABS
650.0000 mg | ORAL_TABLET | Freq: Four times a day (QID) | ORAL | Status: DC | PRN
  Administered 2016-10-09: 650 mg via ORAL
  Filled 2016-10-09: qty 2

## 2016-10-09 MED ORDER — AMIODARONE HCL 200 MG PO TABS: 200.0000 mg | ORAL_TABLET | Freq: Every day | ORAL | 1 refills | Status: DC

## 2016-10-09 MED ORDER — CEFAZOLIN SODIUM-DEXTROSE 2-4 GM/100ML-% IV SOLN: 2.0000 g | Freq: Three times a day (TID) | INTRAVENOUS | 0 refills | Status: AC

## 2016-10-09 MED ORDER — METHADONE HCL 10 MG PO TABS
5.0000 mg | ORAL_TABLET | Freq: Once | ORAL | Status: AC
  Administered 2016-10-09: 5 mg via ORAL
  Filled 2016-10-09: qty 1

## 2016-10-09 MED ORDER — ENSURE ENLIVE PO LIQD
237.0000 mL | Freq: Two times a day (BID) | ORAL | Status: DC
  Administered 2016-10-09 – 2016-10-10 (×3): 237 mL via ORAL

## 2016-10-09 MED ORDER — IPRATROPIUM-ALBUTEROL 0.5-2.5 (3) MG/3ML IN SOLN: 3.0000 mL | Freq: Four times a day (QID) | RESPIRATORY_TRACT | 1 refills | Status: DC | PRN

## 2016-10-09 MED ORDER — POLYETHYLENE GLYCOL 3350 17 G PO PACK: 17.0000 g | PACK | Freq: Every day | ORAL | 0 refills | Status: DC

## 2016-10-09 MED ORDER — ENSURE ENLIVE PO LIQD: 237.0000 mL | Freq: Two times a day (BID) | ORAL | 12 refills | Status: DC

## 2016-10-09 NOTE — Clinical Social Work Note (Addendum)
Patient has had 6 SNFs which have declined patient and he still does not have any bed offers, CSW continuing to look for bed offers, patient's information has been faxed to [facility] as well.  CSW received phone call from [facility] who said they can accept patient on Saturday pending insurance authorizaton.  CSW presented bed offer to patient's wife, and she agreed to have patient go to [facility] [place], [place] updated physician.  CSW contacted insurance company to begin authorization, insurance company are requesting updated PT note, CSW updated PT who will attempt to see patient.  CSW to continue to follow patient's progress throughout discharge planning.  CSW received phone call from insurance company with authorization number of [id number].  [facility]W updated [facility] with authorization number, they can accept patient on Saturday.   Jones Broom. Louisa Favaro, MSW, Latanya Presser [phone]  Mon-Fri 8a-4:30p 10/09/2016 1:10 PM

## 2016-10-09 NOTE — Progress Notes (Signed)
MD notified. Pt is complaining of pain after prn tylenol was not effective. Orders for ultram received. I will continue to assess.

## 2016-10-09 NOTE — Discharge Summary (Deleted)
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 Chronic pain   . Coronary artery disease   . Diabetes mellitus without complication ([place])   . DJD (degenerative joint disease)   . GERD (gastroesophageal reflux disease)   . Hyperlipemia   . Hypertension   . Insomnia   . Neuropathy [facility])     HOSPITAL COURSE:   70 year old male with past medical history significant for arthritis and chronic pain, hypertension, neuropathy, diabetes mellitus and coronary artery disease admitted to ICU for sepsis and acute respiratory failure. Patient has been extubated and off pressors and Precedex drip and so being transferred to floor.  #1 sepsis-secondary to MSSA bacteremia, likely source being pneumonia , skin abrasions on the leg -Appreciate ID consult. Repeat blood cultures from 10/05/2016 are negative,  s/p PICC line12/21/17 -Continue IV Ancef till 11/17/15 -TEE positive for MITRAL valve Endocarditis---abxs till 11/17/15 -We will need SNF placement  #2 altered mental status-likely toxic metabolic encephalopathy---mentation better -CT of the head negative for acute findings while in ICU. - LP done and showing no infection. Appreciate neuro consult, off precedex.  -Decrease methadone dose and also discontinue his gabapentin for now -Monitor closely. No focal  deficits.  #3 Afib with rvr- off cardizem drip. HR elevated Added oral metoprolol. On xarelto  -Echo with diastolic dysfunction, grade 2. EF of 60%. Also has bioprosthetic aortic valve - also on amiodarone  #4 Urinary retention- tried removing foley, retaining urine. Foley placed back again- - started flomax -will let pt go with foley catheter to SNF and have him see urology as out pt Spoke with Dr Tresa Moore and agrees with the plan  #5 chronic pain syndrome- on methadone- dose reduced for now. Gabapentin has been discontinued today  #6 DVT prophylaxis-on Xarelto   Physical therapy recommended rehabilitation.Social worker consulted for d/c planning   CONSULTS OBTAINED:  Treatment Team:  Catarina Hartshorn, MD Ebbie Ridge, MD Leonel Ramsay, MD Isaias Cowman, MD Corey Skains, MD Teodoro Spray, MD  DRUG ALLERGIES:   Allergies  Allergen Reactions  . Acetaminophen-Codeine     Other reaction(s): Headache  . Rofecoxib Other (See Comments)    ulcers  . Tetracyclines & Related     DISCHARGE MEDICATIONS:   Current Discharge Medication List    START taking these medications   Details  amiodarone (PACERONE) 200 MG tablet Take 1 tablet (200 mg total) by mouth daily. Qty: 30 tablet, Refills: 1    ceFAZolin (ANCEF) 2-4 GM/100ML-% IVPB Inject 100 mLs (2 g total) into the vein every 8 (eight) hours. Qty: 1 each, Refills: 0    ciprofloxacin (CILOXAN) 0.3 % ophthalmic solution Place 1 drop into both eyes every 4 (four) hours. Administer 1 drop, every 2 hours, while awake, for 2 days. Then 1 drop, every 4 hours, while awake, for the next 5 days. Qty: 5 mL, Refills: 0    feeding supplement,  ENSURE ENLIVE, (ENSURE ENLIVE) LIQD Take 237 mLs by mouth 2 (two) times daily between meals. Qty: 237 mL, Refills: 12    ipratropium-albuterol (DUONEB) 0.5-2.5 (3) MG/3ML SOLN Take 3 mLs by nebulization every 6 (six) hours as needed. Qty: 360 mL, Refills: 1     polyethylene glycol (MIRALAX / GLYCOLAX) packet Take 17 g by mouth daily. Qty: 14 each, Refills: 0    rivaroxaban (XARELTO) 20 MG TABS tablet Take 1 tablet (20 mg total) by mouth daily with supper. Qty: 30 tablet, Refills: 1    tamsulosin (FLOMAX) 0.4 MG CAPS capsule Take 1 capsule (0.4 mg total) by mouth daily. Qty: 30 capsule, Refills: 0      CONTINUE these medications which have NOT CHANGED   Details  aspirin EC 81 MG tablet Take 1 tablet by mouth daily.    Cyanocobalamin (RA VITAMIN B-12 TR) 1000 MCG TBCR Take 1 tablet by mouth daily.    cyclobenzaprine (FLEXERIL) 10 MG tablet Take 1 tablet by mouth daily.    DULoxetine (CYMBALTA) 60 MG capsule Take 1 capsule by mouth at bedtime.    furosemide (LASIX) 40 MG tablet Take 1 tablet by mouth 2 (two) times daily.    gabapentin (NEURONTIN) 600 MG tablet Take 1 tablet by mouth 3 (three) times daily.    lisinopril (PRINIVIL,ZESTRIL) 10 MG tablet Take 1 tablet by mouth daily.    Loratadine 10 MG CAPS Take 1 capsule by mouth daily.    meclizine (ANTIVERT) 25 MG tablet Take 1 tablet by mouth 3 (three) times daily.    metFORMIN (GLUCOPHAGE) 1000 MG tablet Take 1 tablet by mouth 2 (two) times daily. With meals.    Multiple Vitamin (MULTI-VITAMINS) TABS Take 1 tablet by mouth daily.    omeprazole (PRILOSEC) 20 MG capsule Take 20 mg by mouth daily.    Oxcarbazepine (TRILEPTAL) 300 MG tablet Take 1 tablet by mouth 2 (two) times daily.    pravastatin (PRAVACHOL) 40 MG tablet Take 1 tablet by mouth at bedtime.    methadone (DOLOPHINE) 5 MG tablet Take 1 tablet by mouth 3 (three) times daily.    metoprolol tartrate (LOPRESSOR) 25 MG tablet Take 1 tablet by mouth 2 (two) times daily.        If you experience worsening of your admission symptoms, develop shortness of breath, life threatening emergency, suicidal or homicidal thoughts you must seek medical attention immediately by calling 911 or calling your MD immediately  if symptoms  less severe.  You Must read complete instructions/literature along with all the possible adverse reactions/side effects for all the Medicines you take and that have been prescribed to you. Take any new Medicines after you have completely understood and accept all the possible adverse reactions/side effects.   Please note  You were cared for by a hospitalist during your hospital stay. If you have any questions about your discharge medications or the care you received while you were in the hospital after you are discharged, you can call the unit and asked to speak with the hospitalist on call if the hospitalist that took care of you is not available. Once you are discharged, your primary care physician will handle any further medical issues. Please note that NO REFILLS for any discharge medications will be authorized once you are discharged, as it is imperative that you return to your primary care physician (or establish a relationship with a primary care physician if you do not have one) for your aftercare needs so that they can reassess  your need for medications and monitor your lab values. Today   SUBJECTIVE     VITAL SIGNS:  Blood pressure 129/78, pulse 85, temperature 97.5 F (36.4 C), resp. rate 15, height '5\' 11"'$  (1.803 m), weight 126.8 kg (279 lb 9.6 oz), SpO2 95 %.  I/O:    Intake/Output Summary (Last 24 hours) at 10/09/16 0816 Last data filed at 10/09/16 0442  Gross per 24 hour  Intake              320 ml  Output             1575 ml  Net            -1255 ml    PHYSICAL EXAMINATION:  GENERAL:  70 y.o.-year-old patient lying in the bed with no acute distress.  EYES: Pupils equal, round, reactive to light and accommodation. No scleral icterus. Extraocular muscles intact.  HEENT: Head atraumatic, normocephalic. Oropharynx and nasopharynx clear.  NECK:  Supple, no jugular venous distention. No thyroid enlargement, no tenderness.  LUNGS: Normal breath sounds bilaterally, no  wheezing, rales,rhonchi or crepitation. No use of accessory muscles of respiration.  CARDIOVASCULAR: S1, S2 normal. No murmurs, rubs, or gallops.  ABDOMEN: Soft, non-tender, non-distended. Bowel sounds present. No organomegaly or mass.  EXTREMITIES: No pedal edema, cyanosis, or clubbing.  NEUROLOGIC: Cranial nerves II through XII are intact. Muscle strength 5/5 in all extremities. Sensation intact. Gait not checked.  PSYCHIATRIC: The patient is alert and oriented x 3.  SKIN: No obvious rash, lesion, or ulcer.   DATA REVIEW:   CBC   Recent Labs Lab 10/08/16 0252  WBC 9.5  HGB 12.4*  HCT 36.3*  PLT 186    Chemistries   Recent Labs Lab 10/02/16 1826  10/08/16 0252  NA 131*  < > 137  K 3.2*  < > 4.4  CL 100*  < > 106  CO2 20*  < > 24  GLUCOSE 221*  < > 177*  BUN 13  < > 12  CREATININE 0.78  < > 0.55*  CALCIUM 7.5*  < > 8.1*  MG 1.7  < > 1.8  AST 122*  --   --   ALT 59  --   --   ALKPHOS 66  --   --   BILITOT 0.7  --   --   < > = values in this interval not displayed.  Microbiology Results   Recent Results (from the past 240 hour(s))  Blood Culture (routine x 2)     Status: Abnormal   Collection Time: 10/02/16  8:16 AM  Result Value Ref Range Status   Specimen Description BLOOD RIGHT ARM  Final   Special Requests   Final    BOTTLES DRAWN AEROBIC AND ANAEROBIC  AER 8CC ANA 10CC   Culture  Setup Time   Final    GRAM POSITIVE COCCI IN BOTH AEROBIC AND ANAEROBIC BOTTLES CRITICAL VALUE NOTED.  VALUE IS CONSISTENT WITH PREVIOUSLY REPORTED AND CALLED VALUE.    Culture (A)  Final    STAPHYLOCOCCUS AUREUS SUSCEPTIBILITIES PERFORMED ON PREVIOUS CULTURE WITHIN THE LAST 5 DAYS. Performed at [facility]    Report Status 10/05/2016 FINAL  Final  Blood Culture (routine x 2)     Status: Abnormal   Collection Time: 10/02/16  8:16 AM  Result Value Ref Range Status   Specimen Description BLOOD LEFT ANTECUBITAL  Final   Special Requests   Final    BOTTLES DRAWN  AEROBIC AND  ANAEROBIC  AER 11CC ANA 12CC   Culture  Setup Time   Final    GRAM POSITIVE COCCI IN BOTH AEROBIC AND ANAEROBIC BOTTLES CRITICAL RESULT CALLED TO, READ BACK BY AND VERIFIED WITH: Carmin Richmond @ 4166 10/02/16 by [facility] Performed at [place] (A)  Final   Report Status 10/04/2016 FINAL  Final   Organism ID, Bacteria STAPHYLOCOCCUS AUREUS  Final      Susceptibility   Staphylococcus aureus - MIC*    CIPROFLOXACIN <=0.5 SENSITIVE Sensitive     ERYTHROMYCIN 0.5 SENSITIVE Sensitive     GENTAMICIN <=0.5 SENSITIVE Sensitive     OXACILLIN 0.5 SENSITIVE Sensitive     TETRACYCLINE <=1 SENSITIVE Sensitive     VANCOMYCIN 1 SENSITIVE Sensitive     TRIMETH/SULFA <=10 SENSITIVE Sensitive     CLINDAMYCIN <=0.25 SENSITIVE Sensitive     RIFAMPIN <=0.5 SENSITIVE Sensitive     Inducible Clindamycin NEGATIVE Sensitive     * STAPHYLOCOCCUS AUREUS  Urine culture     Status: None   Collection Time: 10/02/16  8:16 AM  Result Value Ref Range Status   Specimen Description URINE, RANDOM  Final   Special Requests NONE  Final   Culture NO GROWTH Performed at [facility]   Final   Report Status 10/03/2016 FINAL  Final  Blood Culture ID Panel (Reflexed)     Status: Abnormal   Collection Time: 10/02/16  8:16 AM  Result Value Ref Range Status   Enterococcus species NOT DETECTED NOT DETECTED Final   Listeria monocytogenes NOT DETECTED NOT DETECTED Final   Staphylococcus species DETECTED (A) NOT DETECTED Final    Comment: CRITICAL RESULT CALLED TO, READ BACK BY AND VERIFIED WITH: Carmin Richmond @ 0630 10/02/16 by [place]    Staphylococcus aureus DETECTED (A) NOT DETECTED Final    Comment: CRITICAL RESULT CALLED TO, READ BACK BY AND VERIFIED WITH: Carmin Richmond @ 1601 10/02/16 by [facility]    Methicillin resistance NOT DETECTED NOT DETECTED Final   Streptococcus species NOT DETECTED NOT DETECTED Final   Streptococcus agalactiae NOT DETECTED NOT DETECTED  Final   Streptococcus pneumoniae NOT DETECTED NOT DETECTED Final   Streptococcus pyogenes NOT DETECTED NOT DETECTED Final   Acinetobacter baumannii NOT DETECTED NOT DETECTED Final   Enterobacteriaceae species NOT DETECTED NOT DETECTED Final   Enterobacter cloacae complex NOT DETECTED NOT DETECTED Final   Escherichia coli NOT DETECTED NOT DETECTED Final   Klebsiella oxytoca NOT DETECTED NOT DETECTED Final   Klebsiella pneumoniae NOT DETECTED NOT DETECTED Final   Proteus species NOT DETECTED NOT DETECTED Final   Serratia marcescens NOT DETECTED NOT DETECTED Final   Haemophilus influenzae NOT DETECTED NOT DETECTED Final   Neisseria meningitidis NOT DETECTED NOT DETECTED Final   Pseudomonas aeruginosa NOT DETECTED NOT DETECTED Final   Candida albicans NOT DETECTED NOT DETECTED Final   Candida glabrata NOT DETECTED NOT DETECTED Final   Candida krusei NOT DETECTED NOT DETECTED Final   Candida parapsilosis NOT DETECTED NOT DETECTED Final   Candida tropicalis NOT DETECTED NOT DETECTED Final  MRSA PCR Screening     Status: None   Collection Time: 10/02/16  2:40 PM  Result Value Ref Range Status   MRSA by PCR NEGATIVE NEGATIVE Final    Comment:        The GeneXpert MRSA Assay (FDA approved for NASAL specimens only), is one component of a comprehensive MRSA colonization surveillance program. It is not intended to diagnose MRSA infection  nor to guide or monitor treatment for MRSA infections.   CSF culture     Status: None   Collection Time: 10/02/16  3:13 PM  Result Value Ref Range Status   Specimen Description CSF  Final   Special Requests Normal  Final   Gram Stain   Final    RARE WBC SEEN RARE RED BLOOD CELLS NO ORGANISMS SEEN    Culture   Final    NO GROWTH 3 DAYS Performed at [facility]    Report Status 10/06/2016 FINAL  Final  Culture, fungus without smear     Status: None (Preliminary result)   Collection Time: 10/02/16  3:14 PM  Result Value Ref Range Status    Specimen Description CSF  Final   Special Requests Normal  Final   Culture   Final    NO FUNGUS ISOLATED AFTER 3 DAYS Performed at [facility]    Report Status PENDING  Incomplete  Fungus Culture With Stain     Status: None (Preliminary result)   Collection Time: 10/02/16  5:07 PM  Result Value Ref Range Status   Fungus Stain Final report  Final    Comment: (NOTE) Performed At: [facility] [place], [place] [id number] Lindon Romp MD JJ:[id number]    Fungus (Mycology) Culture PENDING  Incomplete   Fungal Source CSF  Final  Fungus Culture Result     Status: None   Collection Time: 10/02/16  5:07 PM  Result Value Ref Range Status   Result 1 Comment  Final    Comment: (NOTE) KOH/Calcofluor preparation:  no fungus observed. Performed At: [facility] [place], [place] [id number] Lindon Romp MD FT:[id number]   Culture, expectorated sputum-assessment     Status: None   Collection Time: 10/02/16  6:18 PM  Result Value Ref Range Status   Specimen Description TRACHEAL ASPIRATE  Final   Special Requests NONE  Final   Sputum evaluation THIS SPECIMEN IS ACCEPTABLE FOR SPUTUM CULTURE  Final   Report Status 10/02/2016 FINAL  Final  Culture, respiratory (NON-Expectorated)     Status: None   Collection Time: 10/02/16  6:18 PM  Result Value Ref Range Status   Specimen Description TRACHEAL ASPIRATE  Final   Special Requests NONE Reflexed from [id number]  Final   Gram Stain   Final    ABUNDANT WBC PRESENT, PREDOMINANTLY MONONUCLEAR FEW GRAM NEGATIVE COCCI IN PAIRS RARE GRAM POSITIVE RODS    Culture   Final    Consistent with normal respiratory flora. Performed at [facility]    Report Status 10/05/2016 FINAL  Final  Culture, blood (single) w Reflex to ID Panel     Status: None (Preliminary result)   Collection Time: 10/05/16  5:23 PM  Result Value Ref Range Status   Specimen Description BLOOD LEFT WRIST  Final    Special Requests   Final    BOTTLES DRAWN AEROBIC AND ANAEROBIC AER 11ML ANA 6ML   Culture NO GROWTH 4 DAYS  Final   Report Status PENDING  Incomplete    RADIOLOGY:  No results found.   Management plans discussed with the patient, family and they are in agreement.  CODE STATUS:     Code Status Orders        Start     Ordered   10/02/16 1134  Full code  Continuous     10/02/16 1139    Code Status History    Date Active Date  Inactive Code Status Order ID Comments User Context   This patient has a current code status but no historical code status.      TOTAL TIME TAKING CARE OF THIS PATIENT:67mnutes.    Shalece Staffa M.D on 10/09/2016 at 8:16 AM  Between 7am to 6pm - Pager - [phone] After 6pm go to [email or web address] - password [email or web address]  Office  [phone] CC: Primary care physician; SIdelle Crouch MD

## 2016-10-09 NOTE — Progress Notes (Signed)
[facility] Cardiology [facility] Encounter Note  Patient: [name] / Admit Date: 10/02/2016 / Date of Encounter: 10/09/2016, 8:20 AM   Subjective: Patient is still slightly immobile. There is shortness of breath with activity and difficulty. No evidence of congestive heart failure type symptoms at this time.  Review of Systems: Positive for: Shortness of breath Negative for: Vision change, hearing change, syncope, dizziness, nausea, vomiting,diarrhea, bloody stool, stomach pain, cough, congestion, diaphoresis, urinary frequency, urinary pain,skin lesions, skin rashes Others previously listed  Objective: Telemetry: Atrial fibrillation with controlled ventricular rate Physical Exam: Blood pressure 129/78, pulse 85, temperature 97.5 F (36.4 C), resp. rate 15, height '5\' 11"'  (1.803 m), weight 126.8 kg (279 lb 9.6 oz), SpO2 95 %. Body mass index is 39 kg/m. General: Well developed, well nourished, in no acute distress. Head: Normocephalic, atraumatic, sclera non-icteric, no xanthomas, nares are without discharge. Neck: No apparent masses Lungs: Normal respirations with no wheezes, no rhonchi, no rales , no crackles   Heart: irregular rate and rhythm, normal S1 S2, no murmur, no rub, no gallop, PMI is normal size and placement, carotid upstroke normal without bruit, jugular venous pressure normal Abdomen: Soft, non-tender, non-distended with normoactive bowel sounds. No hepatosplenomegaly. Abdominal aorta is normal size without bruit Extremities: Trace to 1+ edema, no clubbing, no cyanosis, no ulcers,  Peripheral: 2+ radial, 2+ femoral, 2+ dorsal pedal pulses Neuro: Alert and but not oriented. Moves all extremities spontaneously. Psych:  Responds to questions appropriately with a normal affect.   Intake/Output Summary (Last 24 hours) at 10/09/16 0820 Last data filed at 10/09/16 0442  Gross per 24 hour  Intake              320 ml  Output             1575 ml  Net             -1255 ml    Inpatient Medications:  . ALPRAZolam  0.5 mg Oral TID  . amiodarone  200 mg Oral Daily  .  ceFAZolin (ANCEF) IV  2 g Intravenous Q8H  . chlorhexidine gluconate (MEDLINE KIT)  15 mL Mouth Rinse BID  . ciprofloxacin  1 drop Both Eyes Q4H  . DULoxetine  60 mg Oral Daily  . famotidine  20 mg Oral BID  . feeding supplement (ENSURE ENLIVE)  237 mL Oral BID BM  . insulin aspart  0-15 Units Subcutaneous TID WC  . insulin aspart  0-5 Units Subcutaneous QHS  . ipratropium-albuterol  3 mL Nebulization TID  . methadone  5 mg Oral Q12H  . metoprolol tartrate  25 mg Oral TID  . multivitamins with iron  1 tablet Oral Daily  . OXcarbazepine  300 mg Oral BID  . polyethylene glycol  17 g Oral Daily  . pravastatin  40 mg Oral q1800  . rivaroxaban  20 mg Oral Q supper  . senna-docusate  1 tablet Oral BID  . tamsulosin  0.4 mg Oral Daily   Infusions:  . sodium chloride    . sodium chloride      Labs:  Recent Labs  10/07/16 0455 10/08/16 0252  NA 138 137  K 3.7 4.4  CL 106 106  CO2 26 24  GLUCOSE 128* 177*  BUN 13 12  CREATININE 0.57* 0.55*  CALCIUM 8.1* 8.1*  MG  --  1.8  PHOS  --  3.0   No results for input(s): AST, ALT, ALKPHOS, BILITOT, PROT, ALBUMIN in the last 72 hours.  Recent Labs  10/08/16 0252  WBC 9.5  HGB 12.4*  HCT 36.3*  MCV 85.1  PLT 186   No results for input(s): CKTOTAL, CKMB, TROPONINI in the last 72 hours. Invalid input(s): POCBNP No results for input(s): HGBA1C in the last 72 hours.   Weights: Filed Weights   10/07/16 0431 10/08/16 0500 10/09/16 0421  Weight: 130.1 kg (286 lb 14.4 oz) 129.8 kg (286 lb 1.6 oz) 126.8 kg (279 lb 9.6 oz)     Radiology/Studies:  Dg Chest 1 View  Result Date: 10/04/2016 CLINICAL DATA:  Dyspnea. EXAM: CHEST 1 VIEW COMPARISON:  10/03/2016. FINDINGS: Endotracheal tube in satisfactory position. Nasogastric tube extending into the stomach. Prosthetic heart valve. Cervical spine fixation hardware and thoracic  spine degenerative changes. Stable mildly enlarged cardiac silhouette and prominence of the interstitial markings. Left basilar opacity with a more patchy appearance today. IMPRESSION: Mildly increased left basilar pneumonia or patchy atelectasis. Stable mild cardiomegaly and chronic interstitial lung disease. Electronically Signed   By: Claudie Revering M.D.   On: 10/04/2016 07:36   Ct Head Wo Contrast  Result Date: 10/02/2016 CLINICAL DATA:  Shortness of Breath, fall, altered mental status EXAM: CT HEAD WITHOUT CONTRAST TECHNIQUE: Contiguous axial images were obtained from the base of the skull through the vertex without intravenous contrast. COMPARISON:  06/18/2015 FINDINGS: Brain: No intracranial hemorrhage, mass effect or midline shift. Stable atrophy and chronic white matter disease. No definite acute cortical infarction. No mass lesion is noted on this unenhanced scan. Vascular: Mild atherosclerotic calcifications of carotid siphon. Skull: No skull fracture is noted. Sinuses/Orbits: There is mucosal thickening with small air-fluid level right maxillary sinus. Mucosal thickening noted bilateral ethmoid air cells. Partially visualized NG tube. Other: None IMPRESSION: No acute intracranial abnormality. No definite acute cortical infarction. Stable atrophy and chronic white matter disease. Paranasal sinuses disease as described above. Electronically Signed   By: Lahoma Crocker M.D.   On: 10/02/2016 12:30   Ct Angio Chest Pe W And/or Wo Contrast  Result Date: 10/02/2016 CLINICAL DATA:  Increased shortness of breath and respiratory distress. Altered mental status. Urinary incontinence. EXAM: CT ANGIOGRAPHY CHEST WITH CONTRAST TECHNIQUE: Multidetector CT imaging of the chest was performed using the standard protocol during bolus administration of intravenous contrast. Multiplanar CT image reconstructions and MIPs were obtained to evaluate the vascular anatomy. CONTRAST:  75 cc Isovue 370. COMPARISON:  None.  FINDINGS: Cardiovascular: Image quality is rather degraded by respiratory motion making assessment of the segmental and subsegmental pulmonary arteries difficult. No central or lobar pulmonary embolus. Atherosclerotic calcification of the arterial vasculature, including coronary arteries. Heart is mildly enlarged. No pericardial effusion. Mediastinum/Nodes: No pathologically enlarged mediastinal, hilar or axillary lymph nodes. Nasogastric tube is seen in the esophagus, extending into the stomach. Lungs/Pleura: Image quality is degraded by respiratory motion. Endotracheal tube terminates approximately 1 cm above the carina. There is bilateral lower lobe collapse/consolidation. No pleural fluid. Airway is otherwise unremarkable. Upper Abdomen: There is a great deal of artifact in the posterior upper abdomen due to the patient's arms. Visualized portions of the liver, adrenal glands, kidneys, spleen, pancreas, stomach and bowel are grossly unremarkable. Nasogastric terminates in the stomach. Musculoskeletal: No worrisome lytic or sclerotic lesions. Degenerative changes are seen in the spine. Review of the MIP images confirms the above findings. IMPRESSION: 1. Image quality is degraded by respiratory motion, limiting evaluation for segmental and subsegmental pulmonary emboli. No central or lobar pulmonary embolus. 2. Endotracheal tube terminates approximately 1 cm above the carina. Consider  retracting approximately 2-3 cm for better positioning, as clinically indicated. 3. Bilateral lower lobe dependent collapse/consolidation, the majority of which likely represents atelectasis. Difficult to exclude aspiration. 4. Aortic atherosclerosis (ICD10-170.0). Coronary artery calcification. Electronically Signed   By: Lorin Picket M.D.   On: 10/02/2016 12:32   Dg Chest Port 1 View  Result Date: 10/03/2016 CLINICAL DATA:  Shortness of Breath EXAM: PORTABLE CHEST 1 VIEW COMPARISON:  10/02/2016 FINDINGS: Endotracheal tube  and NG tube remain in place, unchanged. Prior median sternotomy. Heart is borderline in size. Left lower lobe airspace opacity mildly increased since prior study. No confluent opacity on the right. No visible effusion. IMPRESSION: Increasing left lower lobe opacity concerning for pneumonia. Electronically Signed   By: Rolm Baptise M.D.   On: 10/03/2016 07:20   Dg Chest Portable 1 View  Result Date: 10/02/2016 CLINICAL DATA:  Status post intubation and nasogastric tube placement. Sepsis. EXAM: PORTABLE CHEST 1 VIEW COMPARISON:  Radiograph of same day. FINDINGS: Stable cardiomediastinal silhouette. Endotracheal tube is seen projected over tracheal air shadow with distal tip 2 cm above the carina. Nasogastric tube tip is seen entering stomach. No pneumothorax or significant pleural effusion is noted. Mild diffuse interstitial densities are noted concerning for pulmonary edema. Atherosclerosis of thoracic aorta is noted. Bony thorax is unremarkable. IMPRESSION: Endotracheal and nasogastric tubes in grossly good position. Stable probable bilateral pulmonary edema. Electronically Signed   By: Marijo Conception, M.D.   On: 10/02/2016 10:53   Dg Chest Port 1 View  Result Date: 10/02/2016 CLINICAL DATA:  Altered mental status EXAM: PORTABLE CHEST 1 VIEW COMPARISON:  Mar 07, 2008 FINDINGS: There is mild interstitial edema. There is no airspace consolidation. Heart is mildly enlarged with mild pulmonary venous hypertension. Patient is status post median sternotomy. There is atherosclerotic calcification in the aorta. There is postoperative change in lower cervical spine region. IMPRESSION: Findings indicative of a degree of congestive heart failure. No airspace consolidation. Aortic atherosclerosis. Electronically Signed   By: Lowella Grip III M.D.   On: 10/02/2016 08:32   Dg Cyndy Freeze Guided Loc Of Needle/cath Tip For Spinal Inject Lt  Result Date: 10/02/2016 David A Martinique, MD     10/02/2016  5:25 PM Fluoro  guded lumbar puncture at L3-$ level with acquisition of 3 cc clear colorless spinal fluid. See dictated Radiology report.    Assessment and Recommendation  70 y.o. male with known coronary artery disease essential hypertension atrial fibrillation with controlled ventricular rate and diabetes with complication having acute pneumonia and bacteremia with echocardiogram now showing mitral valve endocarditis. 1. Continue antibiotic treatment over a six-week period due to endocarditis 2. Continue heart rate control of atrial fibrillation and possible spontaneous conversion to normal sinus rhythm with metoprolol and amiodarone  3. Anticoagulation with Xarelto for further risk reduction in stroke with atrial fibrillation 4. Continue high intensity cholesterol therapy for coronary artery disease with pravastatin 5. No further cardiac diagnostics necessary at this time 6. Consideration of home health and/or outside facility for rehabilitation  Signed, Serafina Royals M.D. FACC

## 2016-10-09 NOTE — Progress Notes (Signed)
MD notified. Pts wife states ultram does not relieve patients pain. MD orders extra dose of methadone x1 and also changes order to increase methadone to 3 times a day. I will continue to assess.

## 2016-10-09 NOTE — Progress Notes (Signed)
[place] for Cefazolin Dosing,  Electrolyte Replacement, and Rivaroxaban Dosing  Pharmacy consulted as above for 70 yo male admitted with acute respiratory - s/p extubation. Patient is now being treated for MSSA bacteremia and Afib with RVR.    Plan:   1. Cefazolin: continue cefazolin 2g IV Q8hr. Follow-up cultures obtained on 12/18. Will continue to follow.  2. Electrolyte Replacement: WNL. Will recheck labs in 48 hours.  3. Rivaroxaban: Patient CHADsVASc is 3 (Age, HTN, Diabetes). Will continue rivaroxaban '20mg'$  daily with supper.   Allergies  Allergen Reactions  . Acetaminophen-Codeine     Other reaction(s): Headache  . Rofecoxib Other (See Comments)    ulcers  . Tetracyclines & Related     Patient Measurements: Height: '5\' 11"'$  (180.3 cm) Weight: 279 lb 9.6 oz (126.8 kg) IBW/kg (Calculated) : 75.3  Vital Signs: Temp: 99.7 F (37.6 C) (12/22 1138) Temp Source: Oral (12/22 1138) BP: 123/68 (12/22 1138) Pulse Rate: 88 (12/22 1138) Intake/Output from previous day: 12/21 0701 - 12/22 0700 In: 320 [P.O.:120; IV Piggyback:200] Out: 1575 [Urine:1575] Intake/Output from this shift: Total I/O In: -  Out: 225 [Urine:225]  Labs:  Recent Labs     10/09/16  0953  K  4.0  ]   Recent Labs  10/07/16 0455 10/08/16 0252 10/09/16 0953  WBC  --  9.5  --   HGB  --  12.4*  --   HCT  --  36.3*  --   PLT  --  186  --   CREATININE 0.57* 0.55* 0.60*  MG  --  1.8  --   PHOS  --  3.0  --    Estimated Creatinine Clearance: 116.5 mL/min (by C-G formula based on SCr of 0.6 mg/dL (L)).   Microbiology: Recent Results (from the past 720 hour(s))  Blood Culture (routine x 2)     Status: Abnormal   Collection Time: 10/02/16  8:16 AM  Result Value Ref Range Status   Specimen Description BLOOD RIGHT ARM  Final   Special Requests   Final    BOTTLES DRAWN AEROBIC AND ANAEROBIC  AER 8CC ANA 10CC   Culture  Setup Time   Final    GRAM POSITIVE  COCCI IN BOTH AEROBIC AND ANAEROBIC BOTTLES CRITICAL VALUE NOTED.  VALUE IS CONSISTENT WITH PREVIOUSLY REPORTED AND CALLED VALUE.    Culture (A)  Final    STAPHYLOCOCCUS AUREUS SUSCEPTIBILITIES PERFORMED ON PREVIOUS CULTURE WITHIN THE LAST 5 DAYS. Performed at [facility]    Report Status 10/05/2016 FINAL  Final  Blood Culture (routine x 2)     Status: Abnormal   Collection Time: 10/02/16  8:16 AM  Result Value Ref Range Status   Specimen Description BLOOD LEFT ANTECUBITAL  Final   Special Requests   Final    BOTTLES DRAWN AEROBIC AND ANAEROBIC  AER 11CC ANA 12CC   Culture  Setup Time   Final    GRAM POSITIVE COCCI IN BOTH AEROBIC AND ANAEROBIC BOTTLES CRITICAL RESULT CALLED TO, READ BACK BY AND VERIFIED WITH: Carmin Richmond @ [id number] 10/02/16 by [facility] Performed at [place] (A)  Final   Report Status 10/04/2016 FINAL  Final   Organism ID, Bacteria STAPHYLOCOCCUS AUREUS  Final      Susceptibility   Staphylococcus aureus - MIC*    CIPROFLOXACIN <=0.5 SENSITIVE Sensitive     ERYTHROMYCIN 0.5 SENSITIVE Sensitive     GENTAMICIN <=0.5 SENSITIVE Sensitive  OXACILLIN 0.5 SENSITIVE Sensitive     TETRACYCLINE <=1 SENSITIVE Sensitive     VANCOMYCIN 1 SENSITIVE Sensitive     TRIMETH/SULFA <=10 SENSITIVE Sensitive     CLINDAMYCIN <=0.25 SENSITIVE Sensitive     RIFAMPIN <=0.5 SENSITIVE Sensitive     Inducible Clindamycin NEGATIVE Sensitive     * STAPHYLOCOCCUS AUREUS  Urine culture     Status: None   Collection Time: 10/02/16  8:16 AM  Result Value Ref Range Status   Specimen Description URINE, RANDOM  Final   Special Requests NONE  Final   Culture NO GROWTH Performed at [facility]   Final   Report Status 10/03/2016 FINAL  Final  Blood Culture ID Panel (Reflexed)     Status: Abnormal   Collection Time: 10/02/16  8:16 AM  Result Value Ref Range Status   Enterococcus species NOT DETECTED NOT DETECTED Final   Listeria  monocytogenes NOT DETECTED NOT DETECTED Final   Staphylococcus species DETECTED (A) NOT DETECTED Final    Comment: CRITICAL RESULT CALLED TO, READ BACK BY AND VERIFIED WITH: Carmin Richmond @ [phone] 10/02/16 by [place]    Staphylococcus aureus DETECTED (A) NOT DETECTED Final    Comment: CRITICAL RESULT CALLED TO, READ BACK BY AND VERIFIED WITH: Carmin Richmond @ [phone] 10/02/16 by [facility]    Methicillin resistance NOT DETECTED NOT DETECTED Final   Streptococcus species NOT DETECTED NOT DETECTED Final   Streptococcus agalactiae NOT DETECTED NOT DETECTED Final   Streptococcus pneumoniae NOT DETECTED NOT DETECTED Final   Streptococcus pyogenes NOT DETECTED NOT DETECTED Final   Acinetobacter baumannii NOT DETECTED NOT DETECTED Final   Enterobacteriaceae species NOT DETECTED NOT DETECTED Final   Enterobacter cloacae complex NOT DETECTED NOT DETECTED Final   Escherichia coli NOT DETECTED NOT DETECTED Final   Klebsiella oxytoca NOT DETECTED NOT DETECTED Final   Klebsiella pneumoniae NOT DETECTED NOT DETECTED Final   Proteus species NOT DETECTED NOT DETECTED Final   Serratia marcescens NOT DETECTED NOT DETECTED Final   Haemophilus influenzae NOT DETECTED NOT DETECTED Final   Neisseria meningitidis NOT DETECTED NOT DETECTED Final   Pseudomonas aeruginosa NOT DETECTED NOT DETECTED Final   Candida albicans NOT DETECTED NOT DETECTED Final   Candida glabrata NOT DETECTED NOT DETECTED Final   Candida krusei NOT DETECTED NOT DETECTED Final   Candida parapsilosis NOT DETECTED NOT DETECTED Final   Candida tropicalis NOT DETECTED NOT DETECTED Final  MRSA PCR Screening     Status: None   Collection Time: 10/02/16  2:40 PM  Result Value Ref Range Status   MRSA by PCR NEGATIVE NEGATIVE Final    Comment:        The GeneXpert MRSA Assay (FDA approved for NASAL specimens only), is one component of a comprehensive MRSA colonization surveillance program. It is not intended to diagnose MRSA infection nor to guide  or monitor treatment for MRSA infections.   CSF culture     Status: None   Collection Time: 10/02/16  3:13 PM  Result Value Ref Range Status   Specimen Description CSF  Final   Special Requests Normal  Final   Gram Stain   Final    RARE WBC SEEN RARE RED BLOOD CELLS NO ORGANISMS SEEN    Culture   Final    NO GROWTH 3 DAYS Performed at [facility]    Report Status 10/06/2016 FINAL  Final  Culture, fungus without smear     Status: None (Preliminary result)   Collection Time: 10/02/16  3:14 PM  Result Value Ref Range Status   Specimen Description CSF  Final   Special Requests Normal  Final   Culture   Final    NO FUNGUS ISOLATED AFTER 7 DAYS Performed at [facility]    Report Status PENDING  Incomplete  Fungus Culture With Stain     Status: None (Preliminary result)   Collection Time: 10/02/16  5:07 PM  Result Value Ref Range Status   Fungus Stain Final report  Final    Comment: (NOTE) Performed At: [facility] [place], [place] [id number] Lindon Romp MD YW:[id number]    Fungus (Mycology) Culture PENDING  Incomplete   Fungal Source CSF  Final  Fungus Culture Result     Status: None   Collection Time: 10/02/16  5:07 PM  Result Value Ref Range Status   Result 1 Comment  Final    Comment: (NOTE) KOH/Calcofluor preparation:  no fungus observed. Performed At: [facility] [place], [place] [id number] Lindon Romp MD IH:[id number]   Culture, expectorated sputum-assessment     Status: None   Collection Time: 10/02/16  6:18 PM  Result Value Ref Range Status   Specimen Description TRACHEAL ASPIRATE  Final   Special Requests NONE  Final   Sputum evaluation THIS SPECIMEN IS ACCEPTABLE FOR SPUTUM CULTURE  Final   Report Status 10/02/2016 FINAL  Final  Culture, respiratory (NON-Expectorated)     Status: None   Collection Time: 10/02/16  6:18 PM  Result Value Ref Range Status   Specimen Description  TRACHEAL ASPIRATE  Final   Special Requests NONE Reflexed from [id number]  Final   Gram Stain   Final    ABUNDANT WBC PRESENT, PREDOMINANTLY MONONUCLEAR FEW GRAM NEGATIVE COCCI IN PAIRS RARE GRAM POSITIVE RODS    Culture   Final    Consistent with normal respiratory flora. Performed at [facility]    Report Status 10/05/2016 FINAL  Final  Culture, blood (single) w Reflex to ID Panel     Status: None (Preliminary result)   Collection Time: 10/05/16  5:23 PM  Result Value Ref Range Status   Specimen Description BLOOD LEFT WRIST  Final   Special Requests   Final    BOTTLES DRAWN AEROBIC AND ANAEROBIC AER 11ML ANA 6ML   Culture NO GROWTH 4 DAYS  Final   Report Status PENDING  Incomplete    Medical History: Past Medical History:  Diagnosis Date  . Chronic pain   . Coronary artery disease   . Diabetes mellitus without complication ([place])   . DJD (degenerative joint disease)   . GERD (gastroesophageal reflux disease)   . Hyperlipemia   . Hypertension   . Insomnia   . Neuropathy [facility])     Pharmacy will continue to monitor and adjust per consult.   Ixel Boehning M Fable Huisman 10/09/2016,2:20 PM

## 2016-10-09 NOTE — Discharge Instructions (Signed)
PICC line care per protocol

## 2016-10-09 NOTE — Progress Notes (Signed)
Physical Therapy Treatment Patient Details Name: [name] MRN: [id number] DOB: 12-08-1945 Today's Date: 10/09/2016    History of Present Illness Pt is a 70 year old male with acute respiratory failure, possible secondary to pneumonia and/or ARDS, on minimal oxygen now with agitation and delerium with gram pos bacteremia-MSSA    PT Comments    Pt initially unable to stand with max assist of therapist and use of RW but with practice of progressive standing activities (see below for details) pt able to eventual almost come to full stand with RW and max assist of therapist.  Pt requiring increased time to respond to cueing and inconsistent with one step commands during session.  Pt in chair upon PT arrival beginning of session and PT left pt in chair end of session (bowel incontinence noted in chair and nursing notified of this and also PT recommendation for hoyer lift back to bed/for transfers).  Will continue to progress pt with strengthening, transfers, and progressive functional mobility per pt tolerance.   Follow Up Recommendations  SNF     Equipment Recommendations   (TBD at post acute setting)    Recommendations for Other Services       Precautions / Restrictions Precautions Precautions: Fall Restrictions Weight Bearing Restrictions: No    Mobility  Bed Mobility               General bed mobility comments: Deferred d/t pt sitting up in chair upon PT arrival (and in chair end of session).  Transfers Overall transfer level: Needs assistance Equipment used: Rolling walker (2 wheeled) Transfers: Sit to/from Stand Sit to Stand: Max assist         General transfer comment: initially starting with sitting rocking to facilitate standing (pt able to rock forward and backward with CGA but unable to clear bottom from chair when cued and assisted; unable to stand with max assist of therapist and use of RW); then transitioned to partial standing activity (therapist  standing in front of pt with B knees blocked and pt pushing with B UE's from chair armrests) and pt able to eventually come to 1/2 standing with multiple attempts and cueing; then trialed standing with RW again and pt able to come to almost full stand with max assist of therapist (R knee blocked) with significant cueing for technique and posture (limited time standing d/t pt fatigue and needing to sit back down)  Ambulation/Gait             General Gait Details: Not appropriate at this time   Stairs            Wheelchair Mobility    Modified Rankin (Stroke Patients Only)       Balance Overall balance assessment: Needs assistance Sitting-balance support: Bilateral upper extremity supported;Feet supported Sitting balance-Leahy Scale: Fair       Standing balance-Leahy Scale: Poor Standing balance comment: posterior lean in almost full stand requiring mod to max assist                    Cognition Arousal/Alertness: Awake/alert Behavior During Therapy: Flat affect Overall Cognitive Status: Impaired/Different from baseline Area of Impairment: Attention;Following commands Orientation Level: Disoriented to;Place;Time;Situation Current Attention Level: Alternating   Following Commands: Follows one step commands inconsistently Safety/Judgement: Decreased awareness of safety          Exercises      General Comments General comments (skin integrity, edema, etc.): Pt's son present for entire session.  Pt already in chair upon  PT entering room.      Pertinent Vitals/Pain Pain Assessment: No/denies pain  Vitals (HR and O2) stable and WFL throughout treatment session.    Home Living                      Prior Function            PT Goals (current goals can now be found in the care plan section) Acute Rehab PT Goals Patient Stated Goal: To golf again PT Goal Formulation: With patient Time For Goal Achievement: 10/19/16 Potential to Achieve  Goals: Fair Progress towards PT goals: Progressing toward goals    Frequency    Min 2X/week      PT Plan Current plan remains appropriate    Co-evaluation             End of Session Equipment Utilized During Treatment: Gait belt Activity Tolerance: Patient limited by fatigue Patient left: in chair;with call bell/phone within reach;with chair alarm set;with family/visitor present     Time: 1530-1600 PT Time Calculation (min) (ACUTE ONLY): 30 min  Charges:  $Therapeutic Activity: 23-37 mins                    G CodesLeitha Bleak 10/19/16, 4:19 PM Leitha Bleak, [place]

## 2016-10-09 NOTE — NC FL2 (Signed)
[place] LEVEL OF CARE SCREENING TOOL     IDENTIFICATION  Patient Name: [name] Birthdate: May 16, 1946 Sex: male Admission Date (Current Location): 10/02/2016  [place] and [place] Number:  Engineering geologist and Address:  [facility], [address] [place], [place] [address]      Provider Number: [id number]  Attending Physician Name and Address:  Fritzi Mandes, MD  Relative Name and Phone Number:  Sevin, Langenbach [phone] or Stafford,Michael    [phone]     Current Level of Care: Hospital Recommended Level of Care: [place] Prior Approval Number:    Date Approved/Denied:   PASRR Number: [id number]  Discharge Plan: SNF    Current Diagnoses: Patient Active Problem List   Diagnosis Date Noted  . Pressure injury of skin 10/03/2016  . Sepsis ([place]) 10/02/2016    Orientation RESPIRATION BLADDER Height & Weight     Self  O2 (3L) Continent Weight: 279 lb 9.6 oz (126.8 kg) Height:  '5\' 11"'$  (180.3 cm)  BEHAVIORAL SYMPTOMS/MOOD NEUROLOGICAL BOWEL NUTRITION STATUS      Continent Diet (Soft diet)  AMBULATORY STATUS COMMUNICATION OF NEEDS Skin   Extensive Assist Verbally                         Personal Care Assistance Level of Assistance  Bathing, Dressing, Feeding Bathing Assistance: Limited assistance Feeding assistance: Limited assistance Dressing Assistance: Limited assistance     Functional Limitations Info  Sight, Speech, Hearing Sight Info: Adequate Hearing Info: Adequate Speech Info: Adequate    SPECIAL CARE FACTORS FREQUENCY  PT (By licensed PT), OT (By licensed OT)     PT Frequency: 5x a week OT Frequency: 5x a week            Contractures Contractures Info: Not present    Additional Factors Info  Psychotropic, Insulin Sliding Scale Code Status Info: Full Code Allergies Info: ACETAMINOPHEN-CODEINE, ROFECOXIB, TETRACYCLINES & RELATED  Psychotropic Info:  ALPRAZolam (XANAX) tablet 0.5 mg or  Insulin Sliding Scale Info: insulin aspart (novoLOG) injection 0-15 Units       Current Medications (10/09/2016):  This is the current hospital active medication list Current Facility-Administered Medications  Medication Dose Route Frequency Provider Last Rate Last Dose  . 0.9 %  sodium chloride infusion   Intravenous Continuous Gladstone Lighter, MD      . 0.9 %  sodium chloride infusion   Intravenous Continuous Teodoro Spray, MD      . acetaminophen (TYLENOL) suppository 650 mg  650 mg Rectal Q6H PRN Mikael Spray, NP   650 mg at 10/03/16 0302  . acetaminophen (TYLENOL) tablet 650 mg  650 mg Oral Q6H PRN Fritzi Mandes, MD      . ALPRAZolam Duanne Moron) tablet 0.5 mg  0.5 mg Oral TID Flora Lipps, MD   0.5 mg at 10/09/16 0937  . amiodarone (PACERONE) tablet 200 mg  200 mg Oral Daily Flora Lipps, MD   200 mg at 10/09/16 0942  . ceFAZolin (ANCEF) IVPB 2g/100 mL premix  2 g Intravenous Q8H Laverle Hobby, MD   2 g at 10/09/16 0525  . chlorhexidine gluconate (MEDLINE KIT) (PERIDEX) 0.12 % solution 15 mL  15 mL Mouth Rinse BID Mikael Spray, NP   15 mL at 10/09/16 0941  . ciprofloxacin (CILOXAN) 0.3 % ophthalmic solution 1 drop  1 drop Both Eyes Q4H Elsie Stain, MD   1 drop at 10/09/16 [phone]  .  DULoxetine (CYMBALTA) DR capsule 60 mg  60 mg Oral Daily Flora Lipps, MD   60 mg at 10/09/16 0943  . famotidine (PEPCID) tablet 20 mg  20 mg Oral BID Laverle Hobby, MD   20 mg at 10/09/16 1067  . feeding supplement (ENSURE ENLIVE) (ENSURE ENLIVE) liquid 237 mL  237 mL Oral BID BM Gladstone Lighter, MD   237 mL at 10/09/16 0942  . insulin aspart (novoLOG) injection 0-15 Units  0-15 Units Subcutaneous TID WC Flora Lipps, MD   2 Units at 10/09/16 0940  . insulin aspart (novoLOG) injection 0-5 Units  0-5 Units Subcutaneous QHS Flora Lipps, MD      . ipratropium-albuterol (DUONEB) 0.5-2.5 (3) MG/3ML nebulizer solution 3 mL  3 mL Nebulization TID Laverle Hobby, MD   3 mL at 10/08/16 2117  . ipratropium-albuterol (DUONEB) 0.5-2.5 (3) MG/3ML nebulizer solution 3 mL  3 mL Nebulization Q6H PRN Laverle Hobby, MD   3 mL at 10/09/16 0152  . methadone (DOLOPHINE) tablet 5 mg  5 mg Oral Q12H Gladstone Lighter, MD   5 mg at 10/09/16 7616  . metoprolol tartrate (LOPRESSOR) tablet 25 mg  25 mg Oral TID Gladstone Lighter, MD   25 mg at 10/09/16 0760  . multivitamins with iron tablet 1 tablet  1 tablet Oral Daily Flora Lipps, MD   1 tablet at 10/09/16 [phone]  . Oxcarbazepine (TRILEPTAL) tablet 300 mg  300 mg Oral BID Flora Lipps, MD   300 mg at 10/09/16 5547  . polyethylene glycol (MIRALAX / GLYCOLAX) packet 17 g  17 g Oral Daily Gladstone Lighter, MD   17 g at 10/09/16 0941  . pravastatin (PRAVACHOL) tablet 40 mg  40 mg Oral q1800 Flora Lipps, MD   40 mg at 10/08/16 1721  . rivaroxaban (XARELTO) tablet 20 mg  20 mg Oral Q supper Flora Lipps, MD   20 mg at 10/08/16 1722  . senna-docusate (Senokot-S) tablet 1 tablet  1 tablet Oral BID Laverle Hobby, MD   1 tablet at 10/09/16 [phone]  . tamsulosin (FLOMAX) capsule 0.4 mg  0.4 mg Oral Daily Gladstone Lighter, MD   0.4 mg at 10/09/16 5525     Discharge Medications: Please see discharge summary for a list of discharge medications.  Relevant Imaging Results:  Relevant Lab Results:   Additional Information SSN [id number]  Ross Ludwig, [place]

## 2016-10-10 DIAGNOSIS — A419 Sepsis, unspecified organism: Secondary | ICD-10-CM | POA: Diagnosis not present

## 2016-10-10 DIAGNOSIS — I10 Essential (primary) hypertension: Secondary | ICD-10-CM | POA: Diagnosis not present

## 2016-10-10 DIAGNOSIS — R41841 Cognitive communication deficit: Secondary | ICD-10-CM | POA: Diagnosis not present

## 2016-10-10 DIAGNOSIS — I330 Acute and subacute infective endocarditis: Secondary | ICD-10-CM | POA: Diagnosis not present

## 2016-10-10 DIAGNOSIS — E785 Hyperlipidemia, unspecified: Secondary | ICD-10-CM | POA: Diagnosis not present

## 2016-10-10 DIAGNOSIS — J189 Pneumonia, unspecified organism: Secondary | ICD-10-CM | POA: Diagnosis not present

## 2016-10-10 DIAGNOSIS — M6281 Muscle weakness (generalized): Secondary | ICD-10-CM | POA: Diagnosis not present

## 2016-10-10 DIAGNOSIS — Z5189 Encounter for other specified aftercare: Secondary | ICD-10-CM | POA: Diagnosis not present

## 2016-10-10 DIAGNOSIS — I058 Other rheumatic mitral valve diseases: Secondary | ICD-10-CM | POA: Diagnosis not present

## 2016-10-10 DIAGNOSIS — Z7401 Bed confinement status: Secondary | ICD-10-CM | POA: Diagnosis not present

## 2016-10-10 DIAGNOSIS — A4101 Sepsis due to Methicillin susceptible Staphylococcus aureus: Secondary | ICD-10-CM | POA: Diagnosis not present

## 2016-10-10 DIAGNOSIS — I4891 Unspecified atrial fibrillation: Secondary | ICD-10-CM | POA: Diagnosis not present

## 2016-10-10 DIAGNOSIS — E088 Diabetes mellitus due to underlying condition with unspecified complications: Secondary | ICD-10-CM | POA: Diagnosis not present

## 2016-10-10 DIAGNOSIS — I38 Endocarditis, valve unspecified: Secondary | ICD-10-CM | POA: Diagnosis not present

## 2016-10-10 DIAGNOSIS — I25119 Atherosclerotic heart disease of native coronary artery with unspecified angina pectoris: Secondary | ICD-10-CM | POA: Diagnosis not present

## 2016-10-10 LAB — CULTURE, BLOOD (SINGLE): Culture: NO GROWTH

## 2016-10-10 LAB — GLUCOSE, CAPILLARY
GLUCOSE-CAPILLARY: 125 mg/dL — AB (ref 65–99)
Glucose-Capillary: 129 mg/dL — ABNORMAL HIGH (ref 65–99)

## 2016-10-10 NOTE — Discharge Summary (Signed)
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 Chronic pain   . Coronary artery disease   . Diabetes mellitus without complication ()   . DJD (degenerative joint disease)   . GERD (gastroesophageal reflux disease)   . Hyperlipemia   . Hypertension   . Insomnia   . Neuropathy [facility])     HOSPITAL COURSE:   70 year old male with past medical history significant for arthritis and chronic pain, hypertension, neuropathy, diabetes mellitus and coronary artery disease admitted to ICU for sepsis and acute respiratory failure. Patient has been extubated and off pressors and Precedex drip and so being transferred to floor.  #1 sepsis-secondary to MSSA bacteremia, likely source being pneumonia , skin abrasions on the leg -Appreciate ID consult. Repeat blood cultures from 10/05/2016 are negative - s/p PICC line12/21/17 -Continue IV Ancef till 11/17/15 -TEE positive for MITRAL valve Endocarditis---abxs till 11/17/15 -We will need SNF placement  #2 altered mental status-likely toxic metabolic encephalopathy---mentation better -CT of the head negative for acute findings while in ICU. - LP done and showing no infection. Appreciate neuro consult, off precedex.  -Decrease methadone dose and also discontinue  his gabapentin for now -Monitor closely. No focal deficits.  #3 Afib with rvr- off cardizem drip. HR elevated Added oral metoprolol. On xarelto  -Echo with diastolic dysfunction, grade 2. EF of 60%. Also has bioprosthetic aortic valve - also on amiodarone  #4 Urinary retention- tried removing foley, retaining urine. Foley placed back again- - started flomax -will let pt go with foley catheter to SNF and have him see urology as out pt Spoke with Dr Tresa Moore and agrees with the plan  #5 chronic pain syndrome- on methadone- dose reduced for now. Gabapentin has been discontinued today  #6 DVT prophylaxis-on Xarelto   Physical therapy recommended rehabilitation.Social worker consulted for d/c planning   CONSULTS OBTAINED:  Treatment Team:  Catarina Hartshorn, MD Ebbie Ridge, MD Leonel Ramsay, MD Isaias Cowman, MD Corey Skains, MD Teodoro Spray, MD  DRUG ALLERGIES:   Allergies  Allergen Reactions  . Acetaminophen-Codeine     Other reaction(s): Headache  . Rofecoxib Other (See Comments)    ulcers  . Tetracyclines & Related     DISCHARGE MEDICATIONS:   Current Discharge Medication List    START taking these medications   Details  amiodarone (PACERONE) 200 MG tablet Take 1 tablet (200 mg total) by mouth daily. Qty: 30 tablet, Refills: 1    ceFAZolin (ANCEF) 2-4 GM/100ML-% IVPB Inject 100 mLs (2 g total) into the vein every 8 (eight) hours. Qty: 1 each, Refills: 0    ciprofloxacin (CILOXAN) 0.3 % ophthalmic solution Place 1 drop into both eyes every 4 (four) hours. Administer 1 drop, every 2 hours, while awake, for 2 days. Then 1 drop, every 4 hours, while awake, for the next 5 days.  Qty: 5 mL, Refills: 0    feeding supplement, ENSURE ENLIVE, (ENSURE ENLIVE) LIQD Take 237 mLs by mouth 2 (two) times daily between meals. Qty: 237 mL, Refills: 12    ipratropium-albuterol (DUONEB) 0.5-2.5 (3) MG/3ML SOLN Take 3 mLs by nebulization every 6 (six)  hours as needed. Qty: 360 mL, Refills: 1    polyethylene glycol (MIRALAX / GLYCOLAX) packet Take 17 g by mouth daily. Qty: 14 each, Refills: 0    rivaroxaban (XARELTO) 20 MG TABS tablet Take 1 tablet (20 mg total) by mouth daily with supper. Qty: 30 tablet, Refills: 1    tamsulosin (FLOMAX) 0.4 MG CAPS capsule Take 1 capsule (0.4 mg total) by mouth daily. Qty: 30 capsule, Refills: 0      CONTINUE these medications which have NOT CHANGED   Details  aspirin EC 81 MG tablet Take 1 tablet by mouth daily.    Cyanocobalamin (RA VITAMIN B-12 TR) 1000 MCG TBCR Take 1 tablet by mouth daily.    cyclobenzaprine (FLEXERIL) 10 MG tablet Take 1 tablet by mouth daily.    DULoxetine (CYMBALTA) 60 MG capsule Take 1 capsule by mouth at bedtime.    furosemide (LASIX) 40 MG tablet Take 1 tablet by mouth 2 (two) times daily.    gabapentin (NEURONTIN) 600 MG tablet Take 1 tablet by mouth 3 (three) times daily.    lisinopril (PRINIVIL,ZESTRIL) 10 MG tablet Take 1 tablet by mouth daily.    Loratadine 10 MG CAPS Take 1 capsule by mouth daily.    meclizine (ANTIVERT) 25 MG tablet Take 1 tablet by mouth 3 (three) times daily.    metFORMIN (GLUCOPHAGE) 1000 MG tablet Take 1 tablet by mouth 2 (two) times daily. With meals.    Multiple Vitamin (MULTI-VITAMINS) TABS Take 1 tablet by mouth daily.    omeprazole (PRILOSEC) 20 MG capsule Take 20 mg by mouth daily.    Oxcarbazepine (TRILEPTAL) 300 MG tablet Take 1 tablet by mouth 2 (two) times daily.    pravastatin (PRAVACHOL) 40 MG tablet Take 1 tablet by mouth at bedtime.    methadone (DOLOPHINE) 5 MG tablet Take 1 tablet by mouth 3 (three) times daily.    metoprolol tartrate (LOPRESSOR) 25 MG tablet Take 1 tablet by mouth 2 (two) times daily.        If you experience worsening of your admission symptoms, develop shortness of breath, life threatening emergency, suicidal or homicidal thoughts you must seek medical attention immediately by calling 911  or calling your MD immediately  if symptoms less severe.  You Must read complete instructions/literature along with all the possible adverse reactions/side effects for all the Medicines you take and that have been prescribed to you. Take any new Medicines after you have completely understood and accept all the possible adverse reactions/side effects.   Please note  You were cared for by a hospitalist during your hospital stay. If you have any questions about your discharge medications or the care you received while you were in the hospital after you are discharged, you can call the unit and asked to speak with the hospitalist on call if the hospitalist that took care of you is not available. Once you are discharged, your primary care physician will handle any further medical issues. Please note that NO REFILLS for any discharge medications will be authorized once you are discharged, as it is imperative that you return to your primary care physician (or establish a relationship with a primary care physician if you do not have  one) for your aftercare needs so that they can reassess your need for medications and monitor your lab values. Today   SUBJECTIVE   Doing well. Pleasantly confused  VITAL SIGNS:  Blood pressure (!) 125/55, pulse 97, temperature 98.3 F (36.8 C), resp. rate 18, height '5\' 11"'$  (1.803 m), weight 126.5 kg (278 lb 14.4 oz), SpO2 95 %.  I/O:    Intake/Output Summary (Last 24 hours) at 10/10/16 0708 Last data filed at 10/10/16 5284  Gross per 24 hour  Intake              240 ml  Output             3225 ml  Net            -2985 ml    PHYSICAL EXAMINATION:  GENERAL:  70 y.o.-year-old patient lying in the bed with no acute distress. obese EYES: Pupils equal, round, reactive to light and accommodation. No scleral icterus. Extraocular muscles intact.  HEENT: Head atraumatic, normocephalic. Oropharynx and nasopharynx clear.  NECK:  Supple, no jugular venous distention. No  thyroid enlargement, no tenderness.  LUNGS: Normal breath sounds bilaterally, no wheezing, rales,rhonchi or crepitation. No use of accessory muscles of respiration.  CARDIOVASCULAR: S1, S2 normal. No murmurs, rubs, or gallops.  ABDOMEN: Soft, non-tender, non-distended. Bowel sounds present. No organomegaly or mass.  EXTREMITIES: No pedal edema, cyanosis, or clubbing. Right UE PICC line NEUROLOGIC: Cranial nerves II through XII are intact. Muscle strength 5/5 in all extremities. Sensation intact. Gait not checked.  PSYCHIATRIC: The patient is alert and oriented x 3.  SKIN: No obvious rash, lesion, or ulcer.   DATA REVIEW:   CBC   Recent Labs Lab 10/08/16 0252  WBC 9.5  HGB 12.4*  HCT 36.3*  PLT 186    Chemistries   Recent Labs Lab 10/08/16 0252 10/09/16 0953  NA 137 133*  K 4.4 4.0  CL 106 103  CO2 24 25  GLUCOSE 177* 191*  BUN 12 12  CREATININE 0.55* 0.60*  CALCIUM 8.1* 8.0*  MG 1.8  --     Microbiology Results   Recent Results (from the past 240 hour(s))  Blood Culture (routine x 2)     Status: Abnormal   Collection Time: 10/02/16  8:16 AM  Result Value Ref Range Status   Specimen Description BLOOD RIGHT ARM  Final   Special Requests   Final    BOTTLES DRAWN AEROBIC AND ANAEROBIC  AER 8CC ANA 10CC   Culture  Setup Time   Final    GRAM POSITIVE COCCI IN BOTH AEROBIC AND ANAEROBIC BOTTLES CRITICAL VALUE NOTED.  VALUE IS CONSISTENT WITH PREVIOUSLY REPORTED AND CALLED VALUE.    Culture (A)  Final    STAPHYLOCOCCUS AUREUS SUSCEPTIBILITIES PERFORMED ON PREVIOUS CULTURE WITHIN THE LAST 5 DAYS. Performed at [facility]    Report Status 10/05/2016 FINAL  Final  Blood Culture (routine x 2)     Status: Abnormal   Collection Time: 10/02/16  8:16 AM  Result Value Ref Range Status   Specimen Description BLOOD LEFT ANTECUBITAL  Final   Special Requests   Final    BOTTLES DRAWN AEROBIC AND ANAEROBIC  AER 11CC ANA 12CC   Culture  Setup Time   Final    GRAM  POSITIVE COCCI IN BOTH AEROBIC AND ANAEROBIC BOTTLES CRITICAL RESULT CALLED TO, READ BACK BY AND VERIFIED WITH: Carmin Richmond @ 1324 10/02/16 by [facility] Performed at [facility]    Culture  STAPHYLOCOCCUS AUREUS (A)  Final   Report Status 10/04/2016 FINAL  Final   Organism ID, Bacteria STAPHYLOCOCCUS AUREUS  Final      Susceptibility   Staphylococcus aureus - MIC*    CIPROFLOXACIN <=0.5 SENSITIVE Sensitive     ERYTHROMYCIN 0.5 SENSITIVE Sensitive     GENTAMICIN <=0.5 SENSITIVE Sensitive     OXACILLIN 0.5 SENSITIVE Sensitive     TETRACYCLINE <=1 SENSITIVE Sensitive     VANCOMYCIN 1 SENSITIVE Sensitive     TRIMETH/SULFA <=10 SENSITIVE Sensitive     CLINDAMYCIN <=0.25 SENSITIVE Sensitive     RIFAMPIN <=0.5 SENSITIVE Sensitive     Inducible Clindamycin NEGATIVE Sensitive     * STAPHYLOCOCCUS AUREUS  Urine culture     Status: None   Collection Time: 10/02/16  8:16 AM  Result Value Ref Range Status   Specimen Description URINE, RANDOM  Final   Special Requests NONE  Final   Culture NO GROWTH Performed at [facility]   Final   Report Status 10/03/2016 FINAL  Final  Blood Culture ID Panel (Reflexed)     Status: Abnormal   Collection Time: 10/02/16  8:16 AM  Result Value Ref Range Status   Enterococcus species NOT DETECTED NOT DETECTED Final   Listeria monocytogenes NOT DETECTED NOT DETECTED Final   Staphylococcus species DETECTED (A) NOT DETECTED Final    Comment: CRITICAL RESULT CALLED TO, READ BACK BY AND VERIFIED WITH: Carmin Richmond @ 0932 10/02/16 by [facility]    Staphylococcus aureus DETECTED (A) NOT DETECTED Final    Comment: CRITICAL RESULT CALLED TO, READ BACK BY AND VERIFIED WITH: Carmin Richmond @ 3557 10/02/16 by [facility]    Methicillin resistance NOT DETECTED NOT DETECTED Final   Streptococcus species NOT DETECTED NOT DETECTED Final   Streptococcus agalactiae NOT DETECTED NOT DETECTED Final   Streptococcus pneumoniae NOT DETECTED NOT DETECTED Final   Streptococcus  pyogenes NOT DETECTED NOT DETECTED Final   Acinetobacter baumannii NOT DETECTED NOT DETECTED Final   Enterobacteriaceae species NOT DETECTED NOT DETECTED Final   Enterobacter cloacae complex NOT DETECTED NOT DETECTED Final   Escherichia coli NOT DETECTED NOT DETECTED Final   Klebsiella oxytoca NOT DETECTED NOT DETECTED Final   Klebsiella pneumoniae NOT DETECTED NOT DETECTED Final   Proteus species NOT DETECTED NOT DETECTED Final   Serratia marcescens NOT DETECTED NOT DETECTED Final   Haemophilus influenzae NOT DETECTED NOT DETECTED Final   Neisseria meningitidis NOT DETECTED NOT DETECTED Final   Pseudomonas aeruginosa NOT DETECTED NOT DETECTED Final   Candida albicans NOT DETECTED NOT DETECTED Final   Candida glabrata NOT DETECTED NOT DETECTED Final   Candida krusei NOT DETECTED NOT DETECTED Final   Candida parapsilosis NOT DETECTED NOT DETECTED Final   Candida tropicalis NOT DETECTED NOT DETECTED Final  MRSA PCR Screening     Status: None   Collection Time: 10/02/16  2:40 PM  Result Value Ref Range Status   MRSA by PCR NEGATIVE NEGATIVE Final    Comment:        The GeneXpert MRSA Assay (FDA approved for NASAL specimens only), is one component of a comprehensive MRSA colonization surveillance program. It is not intended to diagnose MRSA infection nor to guide or monitor treatment for MRSA infections.   CSF culture     Status: None   Collection Time: 10/02/16  3:13 PM  Result Value Ref Range Status   Specimen Description CSF  Final   Special Requests Normal  Final   Gram Stain  Final    RARE WBC SEEN RARE RED BLOOD CELLS NO ORGANISMS SEEN    Culture   Final    NO GROWTH 3 DAYS Performed at [facility]    Report Status 10/06/2016 FINAL  Final  Culture, fungus without smear     Status: None (Preliminary result)   Collection Time: 10/02/16  3:14 PM  Result Value Ref Range Status   Specimen Description CSF  Final   Special Requests Normal  Final   Culture    Final    NO FUNGUS ISOLATED AFTER 7 DAYS Performed at [facility]    Report Status PENDING  Incomplete  Fungus Culture With Stain     Status: None (Preliminary result)   Collection Time: 10/02/16  5:07 PM  Result Value Ref Range Status   Fungus Stain Final report  Final    Comment: (NOTE) Performed At: [facility] [place], [place] [id number] Lindon Romp MD [id number]    Fungus (Mycology) Culture PENDING  Incomplete   Fungal Source CSF  Final  Fungus Culture Result     Status: None   Collection Time: 10/02/16  5:07 PM  Result Value Ref Range Status   Result 1 Comment  Final    Comment: (NOTE) KOH/Calcofluor preparation:  no fungus observed. Performed At: [facility] [place], [place] [id number] Lindon Romp MD XT:[id number]   Culture, expectorated sputum-assessment     Status: None   Collection Time: 10/02/16  6:18 PM  Result Value Ref Range Status   Specimen Description TRACHEAL ASPIRATE  Final   Special Requests NONE  Final   Sputum evaluation THIS SPECIMEN IS ACCEPTABLE FOR SPUTUM CULTURE  Final   Report Status 10/02/2016 FINAL  Final  Culture, respiratory (NON-Expectorated)     Status: None   Collection Time: 10/02/16  6:18 PM  Result Value Ref Range Status   Specimen Description TRACHEAL ASPIRATE  Final   Special Requests NONE Reflexed from [id number]  Final   Gram Stain   Final    ABUNDANT WBC PRESENT, PREDOMINANTLY MONONUCLEAR FEW GRAM NEGATIVE COCCI IN PAIRS RARE GRAM POSITIVE RODS    Culture   Final    Consistent with normal respiratory flora. Performed at [facility]    Report Status 10/05/2016 FINAL  Final  Culture, blood (single) w Reflex to ID Panel     Status: None (Preliminary result)   Collection Time: 10/05/16  5:23 PM  Result Value Ref Range Status   Specimen Description BLOOD LEFT WRIST  Final   Special Requests   Final    BOTTLES DRAWN AEROBIC AND ANAEROBIC AER 11ML ANA 6ML    Culture NO GROWTH 4 DAYS  Final   Report Status PENDING  Incomplete    RADIOLOGY:  No results found.   Management plans discussed with the patient, family and they are in agreement.  CODE STATUS:     Code Status Orders        Start     Ordered   10/02/16 1134  Full code  Continuous     10/02/16 1139    Code Status History    Date Active Date Inactive Code Status Order ID Comments User Context   This patient has a current code status but no historical code status.      TOTAL TIME TAKING CARE OF THIS PATIENT:55mnutes.    Jackalynn Art M.D on 10/10/2016 at 7:08 AM  Between 7am to 6pm - Pager - [phone]  After 6pm go to [email or web address] - password [email or web address] [facility]  Office  [phone]  CC: Primary care physician; Idelle Crouch, MD

## 2016-10-10 NOTE — Progress Notes (Signed)
[facility] [place] NAME: [name]    MR#:  [id number]  DATE OF BIRTH:  19-Mar-1946  SUBJECTIVE:  Doing ok. Some intermittent confusion Neck pain  REVIEW OF SYSTEMS:   Review of Systems  Constitutional: Negative for chills, fever and weight loss.  HENT: Negative for ear discharge, ear pain and nosebleeds.   Eyes: Negative for blurred vision, pain and discharge.  Respiratory: Negative for sputum production, shortness of breath, wheezing and stridor.   Cardiovascular: Negative for chest pain, palpitations, orthopnea and PND.  Gastrointestinal: Negative for abdominal pain, diarrhea, nausea and vomiting.  Genitourinary: Negative for frequency and urgency.  Musculoskeletal: Positive for back pain and joint pain.  Neurological: Positive for weakness. Negative for sensory change, speech change and focal weakness.  Psychiatric/Behavioral: Negative for depression and hallucinations. The patient is not nervous/anxious.    Tolerating Diet: Tolerating PT:   DRUG ALLERGIES:   Allergies  Allergen Reactions  . Acetaminophen-Codeine     Other reaction(s): Headache  . Rofecoxib Other (See Comments)    ulcers  . Tetracyclines & Related     VITALS:  Blood pressure (!) 125/55, pulse 97, temperature 98.3 F (36.8 C), resp. rate 18, height '5\' 11"'$  (1.803 m), weight 126.5 kg (278 lb 14.4 oz), SpO2 95 %.  PHYSICAL EXAMINATION:   Physical Exam  GENERAL:  70 y.o.-year-old patient lying in the bed with no acute distress. obese EYES: Pupils equal, round, reactive to light and accommodation. No scleral icterus. Extraocular muscles intact.  HEENT: Head atraumatic, normocephalic. Oropharynx and nasopharynx clear.  NECK:  Supple, no jugular venous distention. No thyroid enlargement, no tenderness.  LUNGS: Normal breath sounds bilaterally, no wheezing, rales, rhonchi. No use of accessory muscles of respiration.  CARDIOVASCULAR: S1, S2 normal.  No murmurs, rubs, or gallops.  ABDOMEN: Soft, nontender, nondistended. Bowel sounds present. No organomegaly or mass.  EXTREMITIES: No cyanosis, clubbing or edema b/l.   Right UE PICC line NEUROLOGIC: Cranial nerves II through XII are intact. No focal Motor or sensory deficits b/l.   PSYCHIATRIC:  patient is alert and oriented x 2.  SKIN: No obvious rash, lesion, or ulcer.   LABORATORY PANEL:  CBC  Recent Labs Lab 10/08/16 0252  WBC 9.5  HGB 12.4*  HCT 36.3*  PLT 186    Chemistries   Recent Labs Lab 10/08/16 0252 10/09/16 0953  NA 137 133*  K 4.4 4.0  CL 106 103  CO2 24 25  GLUCOSE 177* 191*  BUN 12 12  CREATININE 0.55* 0.60*  CALCIUM 8.1* 8.0*  MG 1.8  --    Cardiac Enzymes No results for input(s): TROPONINI in the last 168 hours. RADIOLOGY:  No results found. ASSESSMENT AND PLAN:  70 year old male with past medical history significant for arthritis and chronic pain, hypertension, neuropathy, diabetes mellitus and coronary artery disease admitted to ICU for sepsis and acute respiratory failure. Patient has been extubated and off pressors and Precedex drip and so being transferred to floor.  #1 sepsis-secondary to MSSA bacteremia, likely source being pneumonia , skin abrasions on the leg -Appreciate ID consult. Repeat blood cultures from 10/05/2016 are negative, s/p PICC line12/21/17 -Continue IV Ancef till 11/17/15 -TEE positive for MITRAL valve Endocarditis---abxs till 11/17/15 -We will need SNF placement  #2 altered mental status-likely toxicmetabolic encephalopathy---mentation better -CT of the head negative for acute findings while in ICU. - LP done and showing no infection. Appreciate neuro consult, off precedex.  -Decrease methadone dose and  also discontinue his gabapentin for now -Monitor closely. No focal deficits.  #3 Afib with rvr- off cardizem drip. HR elevated Added oral metoprolol. On xarelto  -Echo with diastolic dysfunction,grade 2. EF of  60%. Also has bioprosthetic aortic valve - also on amiodarone  #4 Urinary retention- tried removing foley, retaining urine. Foley placed back again- - started flomax -will let pt go with foley catheter to SNF and have him see urology as out pt Spoke with Dr Tresa Moore and agrees with the plan  #5 chronic pain syndrome- on methadone- dose reduced for now. Gabapentin has been discontinued today  #6 DVT prophylaxis-on Xarelto   Physical therapy recommended rehabilitation.Social worker consulted for d/c planning  Case discussed with Care Management/Social Worker. Management plans discussed with the patient, family and they are in agreement.  CODE STATUS:full   TOTAL TIME TAKING CARE OF THIS PATIENT: 30 minutes.  >50% time spent on counselling and coordination of care with wife  POSSIBLE D/C IN1 DAYS, DEPENDING ON CLINICAL CONDITION.  Note: This dictation was prepared with Dragon dictation along with smaller phrase technology. Any transcriptional errors that result from this process are unintentional.  Furious Chiarelli M.D on 10/10/2016 at 7:10 AM  Between 7am to 6pm - Pager - [phone]  After 6pm go to [email or web address] - password [email or web address] [facility]  Office  [phone]  CC: Primary care physician; Idelle Crouch, MD

## 2016-10-10 NOTE — Progress Notes (Signed)
[facility] Cardiology [facility] Encounter Note  Patient: [name] / Admit Date: 10/02/2016 / Date of Encounter: 10/10/2016, 10:00 AM   Subjective: Patient is still slightly immobile. There is shortness of breath with activity and difficulty. No evidence of congestive heart failure type symptoms at this time. Slight overall improvements in condition over the last several days  Review of Systems: Positive for: Shortness of breath Negative for: Vision change, hearing change, syncope, dizziness, nausea, vomiting,diarrhea, bloody stool, stomach pain, cough, congestion, diaphoresis, urinary frequency, urinary pain,skin lesions, skin rashes Others previously listed  Objective: Telemetry: Atrial fibrillation with controlled ventricular rate Physical Exam: Blood pressure (!) 125/55, pulse 97, temperature 98.3 F (36.8 C), resp. rate 18, height '5\' 11"'  (1.803 m), weight 126.5 kg (278 lb 14.4 oz), SpO2 93 %. Body mass index is 38.9 kg/m. General: Well developed, well nourished, in no acute distress. Head: Normocephalic, atraumatic, sclera non-icteric, no xanthomas, nares are without discharge. Neck: No apparent masses Lungs: Normal respirations with no wheezes, no rhonchi, no rales , no crackles   Heart: irregular rate and rhythm, normal S1 S2, no murmur, no rub, no gallop, PMI is normal size and placement, carotid upstroke normal without bruit, jugular venous pressure normal Abdomen: Soft, non-tender, non-distended with normoactive bowel sounds. No hepatosplenomegaly. Abdominal aorta is normal size without bruit Extremities: Trace to 1+ edema, no clubbing, no cyanosis, no ulcers,  Peripheral: 2+ radial, 2+ femoral, 2+ dorsal pedal pulses Neuro: Alert and but not oriented. Moves all extremities spontaneously. Psych:  Responds to questions appropriately with a normal affect.   Intake/Output Summary (Last 24 hours) at 10/10/16 1000 Last data filed at 10/10/16 9163  Gross per 24 hour   Intake              240 ml  Output             3000 ml  Net            -2760 ml    Inpatient Medications:  . ALPRAZolam  0.5 mg Oral TID  . amiodarone  200 mg Oral Daily  .  ceFAZolin (ANCEF) IV  2 g Intravenous Q8H  . chlorhexidine gluconate (MEDLINE KIT)  15 mL Mouth Rinse BID  . ciprofloxacin  1 drop Both Eyes Q4H  . DULoxetine  60 mg Oral Daily  . famotidine  20 mg Oral BID  . feeding supplement (ENSURE ENLIVE)  237 mL Oral BID BM  . insulin aspart  0-15 Units Subcutaneous TID WC  . insulin aspart  0-5 Units Subcutaneous QHS  . methadone  5 mg Oral Q8H  . metoprolol tartrate  25 mg Oral TID  . multivitamins with iron  1 tablet Oral Daily  . OXcarbazepine  300 mg Oral BID  . polyethylene glycol  17 g Oral Daily  . pravastatin  40 mg Oral q1800  . rivaroxaban  20 mg Oral Q supper  . senna-docusate  1 tablet Oral BID  . tamsulosin  0.4 mg Oral Daily   Infusions:  . sodium chloride    . sodium chloride      Labs:  Recent Labs  10/08/16 0252 10/09/16 0953  NA 137 133*  K 4.4 4.0  CL 106 103  CO2 24 25  GLUCOSE 177* 191*  BUN 12 12  CREATININE 0.55* 0.60*  CALCIUM 8.1* 8.0*  MG 1.8  --   PHOS 3.0  --    No results for input(s): AST, ALT, ALKPHOS, BILITOT, PROT, ALBUMIN in the  last 72 hours.  Recent Labs  10/08/16 0252  WBC 9.5  HGB 12.4*  HCT 36.3*  MCV 85.1  PLT 186   No results for input(s): CKTOTAL, CKMB, TROPONINI in the last 72 hours. Invalid input(s): POCBNP No results for input(s): HGBA1C in the last 72 hours.   Weights: Filed Weights   10/08/16 0500 10/09/16 0421 10/10/16 0419  Weight: 129.8 kg (286 lb 1.6 oz) 126.8 kg (279 lb 9.6 oz) 126.5 kg (278 lb 14.4 oz)     Radiology/Studies:  Dg Chest 1 View  Result Date: 10/04/2016 CLINICAL DATA:  Dyspnea. EXAM: CHEST 1 VIEW COMPARISON:  10/03/2016. FINDINGS: Endotracheal tube in satisfactory position. Nasogastric tube extending into the stomach. Prosthetic heart valve. Cervical spine fixation  hardware and thoracic spine degenerative changes. Stable mildly enlarged cardiac silhouette and prominence of the interstitial markings. Left basilar opacity with a more patchy appearance today. IMPRESSION: Mildly increased left basilar pneumonia or patchy atelectasis. Stable mild cardiomegaly and chronic interstitial lung disease. Electronically Signed   By: Claudie Revering M.D.   On: 10/04/2016 07:36   Ct Head Wo Contrast  Result Date: 10/02/2016 CLINICAL DATA:  Shortness of Breath, fall, altered mental status EXAM: CT HEAD WITHOUT CONTRAST TECHNIQUE: Contiguous axial images were obtained from the base of the skull through the vertex without intravenous contrast. COMPARISON:  06/18/2015 FINDINGS: Brain: No intracranial hemorrhage, mass effect or midline shift. Stable atrophy and chronic white matter disease. No definite acute cortical infarction. No mass lesion is noted on this unenhanced scan. Vascular: Mild atherosclerotic calcifications of carotid siphon. Skull: No skull fracture is noted. Sinuses/Orbits: There is mucosal thickening with small air-fluid level right maxillary sinus. Mucosal thickening noted bilateral ethmoid air cells. Partially visualized NG tube. Other: None IMPRESSION: No acute intracranial abnormality. No definite acute cortical infarction. Stable atrophy and chronic white matter disease. Paranasal sinuses disease as described above. Electronically Signed   By: Lahoma Crocker M.D.   On: 10/02/2016 12:30   Ct Angio Chest Pe W And/or Wo Contrast  Result Date: 10/02/2016 CLINICAL DATA:  Increased shortness of breath and respiratory distress. Altered mental status. Urinary incontinence. EXAM: CT ANGIOGRAPHY CHEST WITH CONTRAST TECHNIQUE: Multidetector CT imaging of the chest was performed using the standard protocol during bolus administration of intravenous contrast. Multiplanar CT image reconstructions and MIPs were obtained to evaluate the vascular anatomy. CONTRAST:  75 cc Isovue 370.  COMPARISON:  None. FINDINGS: Cardiovascular: Image quality is rather degraded by respiratory motion making assessment of the segmental and subsegmental pulmonary arteries difficult. No central or lobar pulmonary embolus. Atherosclerotic calcification of the arterial vasculature, including coronary arteries. Heart is mildly enlarged. No pericardial effusion. Mediastinum/Nodes: No pathologically enlarged mediastinal, hilar or axillary lymph nodes. Nasogastric tube is seen in the esophagus, extending into the stomach. Lungs/Pleura: Image quality is degraded by respiratory motion. Endotracheal tube terminates approximately 1 cm above the carina. There is bilateral lower lobe collapse/consolidation. No pleural fluid. Airway is otherwise unremarkable. Upper Abdomen: There is a great deal of artifact in the posterior upper abdomen due to the patient's arms. Visualized portions of the liver, adrenal glands, kidneys, spleen, pancreas, stomach and bowel are grossly unremarkable. Nasogastric terminates in the stomach. Musculoskeletal: No worrisome lytic or sclerotic lesions. Degenerative changes are seen in the spine. Review of the MIP images confirms the above findings. IMPRESSION: 1. Image quality is degraded by respiratory motion, limiting evaluation for segmental and subsegmental pulmonary emboli. No central or lobar pulmonary embolus. 2. Endotracheal tube terminates approximately 1 cm  above the carina. Consider retracting approximately 2-3 cm for better positioning, as clinically indicated. 3. Bilateral lower lobe dependent collapse/consolidation, the majority of which likely represents atelectasis. Difficult to exclude aspiration. 4. Aortic atherosclerosis (ICD10-170.0). Coronary artery calcification. Electronically Signed   By: Lorin Picket M.D.   On: 10/02/2016 12:32   Dg Chest Port 1 View  Result Date: 10/03/2016 CLINICAL DATA:  Shortness of Breath EXAM: PORTABLE CHEST 1 VIEW COMPARISON:  10/02/2016 FINDINGS:  Endotracheal tube and NG tube remain in place, unchanged. Prior median sternotomy. Heart is borderline in size. Left lower lobe airspace opacity mildly increased since prior study. No confluent opacity on the right. No visible effusion. IMPRESSION: Increasing left lower lobe opacity concerning for pneumonia. Electronically Signed   By: Rolm Baptise M.D.   On: 10/03/2016 07:20   Dg Chest Portable 1 View  Result Date: 10/02/2016 CLINICAL DATA:  Status post intubation and nasogastric tube placement. Sepsis. EXAM: PORTABLE CHEST 1 VIEW COMPARISON:  Radiograph of same day. FINDINGS: Stable cardiomediastinal silhouette. Endotracheal tube is seen projected over tracheal air shadow with distal tip 2 cm above the carina. Nasogastric tube tip is seen entering stomach. No pneumothorax or significant pleural effusion is noted. Mild diffuse interstitial densities are noted concerning for pulmonary edema. Atherosclerosis of thoracic aorta is noted. Bony thorax is unremarkable. IMPRESSION: Endotracheal and nasogastric tubes in grossly good position. Stable probable bilateral pulmonary edema. Electronically Signed   By: Marijo Conception, M.D.   On: 10/02/2016 10:53   Dg Chest Port 1 View  Result Date: 10/02/2016 CLINICAL DATA:  Altered mental status EXAM: PORTABLE CHEST 1 VIEW COMPARISON:  Mar 07, 2008 FINDINGS: There is mild interstitial edema. There is no airspace consolidation. Heart is mildly enlarged with mild pulmonary venous hypertension. Patient is status post median sternotomy. There is atherosclerotic calcification in the aorta. There is postoperative change in lower cervical spine region. IMPRESSION: Findings indicative of a degree of congestive heart failure. No airspace consolidation. Aortic atherosclerosis. Electronically Signed   By: Lowella Grip III M.D.   On: 10/02/2016 08:32   Dg Cyndy Freeze Guided Loc Of Needle/cath Tip For Spinal Inject Lt  Result Date: 10/02/2016 David A Martinique, MD     10/02/2016   5:25 PM Fluoro guded lumbar puncture at L3-$ level with acquisition of 3 cc clear colorless spinal fluid. See dictated Radiology report.    Assessment and Recommendation  70 y.o. male with known coronary artery disease essential hypertension atrial fibrillation with controlled ventricular rate and diabetes with complication having acute pneumonia and bacteremia with echocardiogram now showing mitral valve endocarditis. 1. Continue antibiotic treatment over a six-week period due to endocarditisThrough PICC line 2. Continue heart rate control of atrial fibrillation and possible spontaneous conversion to normal sinus rhythm with metoprolol and amiodarone with adjustments of medication management as an outpatient 3. Anticoagulation with Xarelto for further risk reduction in stroke with atrial fibrillation 4. Continue high intensity cholesterol therapy for coronary artery disease with pravastatin 5. No further cardiac diagnostics necessary at this time 6. Consideration of home health and/or outside facility for rehabilitation and follow-up as an outpatient for adjustments of medication management at that time excellent 7. Call if further questions  Signed, Serafina Royals M.D. FACC

## 2016-10-10 NOTE — Progress Notes (Signed)
Pt is discharged to [facility] via EMS.  Son with the pt on discharge. Report was called by previous nurse. PICC and foley intact.  No verbal complaints of pain or discomfort.

## 2016-10-10 NOTE — Clinical Social Work Note (Signed)
Patient to dc to [facility] via non-emergent EMS. The facility and the family are aware. The patient's wife was at bedside and expressed concerns about his care at the facility. CSW offered emotional support. CSW will con't to follow pending additional dc needs.  Santiago Bumpers, MSW, LCSW-A [phone]

## 2016-10-10 NOTE — Progress Notes (Signed)
Report given to Dawayne Patricia, LPN at [facility]. Patient's being transferred to Room 810 and per report, patient's room is ready. EMS [facility] has been called for Non-Emergent Transport. Patient prepared for transfer; family and patient aware of pending departure.

## 2016-10-15 DIAGNOSIS — E119 Type 2 diabetes mellitus without complications: Secondary | ICD-10-CM | POA: Diagnosis not present

## 2016-10-15 DIAGNOSIS — M6281 Muscle weakness (generalized): Secondary | ICD-10-CM | POA: Diagnosis not present

## 2016-10-15 DIAGNOSIS — R339 Retention of urine, unspecified: Secondary | ICD-10-CM | POA: Diagnosis not present

## 2016-10-15 DIAGNOSIS — G894 Chronic pain syndrome: Secondary | ICD-10-CM | POA: Diagnosis not present

## 2016-10-15 DIAGNOSIS — I4891 Unspecified atrial fibrillation: Secondary | ICD-10-CM | POA: Diagnosis not present

## 2016-10-15 DIAGNOSIS — I10 Essential (primary) hypertension: Secondary | ICD-10-CM | POA: Diagnosis not present

## 2016-10-15 DIAGNOSIS — J189 Pneumonia, unspecified organism: Secondary | ICD-10-CM | POA: Diagnosis not present

## 2016-10-15 DIAGNOSIS — K219 Gastro-esophageal reflux disease without esophagitis: Secondary | ICD-10-CM | POA: Diagnosis not present

## 2016-10-16 DIAGNOSIS — R4182 Altered mental status, unspecified: Secondary | ICD-10-CM | POA: Diagnosis not present

## 2016-10-19 DIAGNOSIS — J189 Pneumonia, unspecified organism: Secondary | ICD-10-CM | POA: Diagnosis not present

## 2016-10-19 DIAGNOSIS — E785 Hyperlipidemia, unspecified: Secondary | ICD-10-CM | POA: Diagnosis not present

## 2016-10-19 DIAGNOSIS — M6281 Muscle weakness (generalized): Secondary | ICD-10-CM | POA: Diagnosis not present

## 2016-10-19 DIAGNOSIS — A419 Sepsis, unspecified organism: Secondary | ICD-10-CM | POA: Diagnosis not present

## 2016-10-19 DIAGNOSIS — I38 Endocarditis, valve unspecified: Secondary | ICD-10-CM | POA: Diagnosis not present

## 2016-10-19 DIAGNOSIS — I4891 Unspecified atrial fibrillation: Secondary | ICD-10-CM | POA: Diagnosis not present

## 2016-10-19 DIAGNOSIS — R41841 Cognitive communication deficit: Secondary | ICD-10-CM | POA: Diagnosis not present

## 2016-10-19 DIAGNOSIS — Z5189 Encounter for other specified aftercare: Secondary | ICD-10-CM | POA: Diagnosis not present

## 2016-10-19 DIAGNOSIS — I10 Essential (primary) hypertension: Secondary | ICD-10-CM | POA: Diagnosis not present

## 2016-10-19 DIAGNOSIS — E088 Diabetes mellitus due to underlying condition with unspecified complications: Secondary | ICD-10-CM | POA: Diagnosis not present

## 2016-10-23 ENCOUNTER — Other Ambulatory Visit: Payer: Self-pay | Admitting: *Deleted

## 2016-10-23 DIAGNOSIS — M79672 Pain in left foot: Secondary | ICD-10-CM | POA: Diagnosis not present

## 2016-10-23 DIAGNOSIS — I10 Essential (primary) hypertension: Secondary | ICD-10-CM | POA: Diagnosis not present

## 2016-10-23 DIAGNOSIS — G454 Transient global amnesia: Secondary | ICD-10-CM | POA: Diagnosis not present

## 2016-10-23 DIAGNOSIS — R0602 Shortness of breath: Secondary | ICD-10-CM | POA: Diagnosis not present

## 2016-10-23 DIAGNOSIS — I4891 Unspecified atrial fibrillation: Secondary | ICD-10-CM | POA: Diagnosis not present

## 2016-10-23 DIAGNOSIS — M79671 Pain in right foot: Secondary | ICD-10-CM | POA: Diagnosis not present

## 2016-10-23 DIAGNOSIS — I058 Other rheumatic mitral valve diseases: Secondary | ICD-10-CM | POA: Diagnosis not present

## 2016-10-23 LAB — CULTURE, FUNGUS WITHOUT SMEAR: SPECIAL REQUESTS: NORMAL

## 2016-10-23 NOTE — Patient Outreach (Signed)
[facility]) Care Management  10/23/2016  MCCRAE SPECIALE February 02, 1946 [id number]   Spoke with patient at facility. Patient reports he is generally in good health.  This episode he has severe sepsis (endocarditis) and is on IV antibiotics 3x day through the end of the month. Patient would like to go home soon, but will probably remain in the facility through end of month for IV antibiotics, he is also working with therapy. He states his wife was checking on [facility] regarding giving antibiotics at home.   RNCM checked in with Reche Dixon, she states that patient would need someone at home to administer medication, they would provide medication, education and support. Due to patient insurance, he would also have a co-pay per home care visit. Reviewed information obtained from home care with patient.   Patient reports his wife is supportive for any needs at home, no medication or transportation issues noted, he denies any [facility] care management needs at this time. RNCM gave [facility] program brochure and 24 hour nurse line magnet for future reference.   RNCM spoke with Laurann Montana, RN and Donnalee Curry, SW regarding patient, they state plan is for patient to remain at facility until IV antibiotics are completed.  Requested they let RNCM know if any new issues or [facility] care management needs arise and RNCM can be re consulted.  Plan to sign off case. Royetta Crochet. [name], RN, BSN, [facility] Care Coordinator [phone]

## 2016-10-24 DIAGNOSIS — I4891 Unspecified atrial fibrillation: Secondary | ICD-10-CM | POA: Diagnosis not present

## 2016-10-24 DIAGNOSIS — K219 Gastro-esophageal reflux disease without esophagitis: Secondary | ICD-10-CM | POA: Diagnosis not present

## 2016-10-24 DIAGNOSIS — E119 Type 2 diabetes mellitus without complications: Secondary | ICD-10-CM | POA: Diagnosis not present

## 2016-10-24 DIAGNOSIS — J189 Pneumonia, unspecified organism: Secondary | ICD-10-CM | POA: Diagnosis not present

## 2016-10-24 DIAGNOSIS — I38 Endocarditis, valve unspecified: Secondary | ICD-10-CM | POA: Diagnosis not present

## 2016-10-24 DIAGNOSIS — I10 Essential (primary) hypertension: Secondary | ICD-10-CM | POA: Diagnosis not present

## 2016-10-26 DIAGNOSIS — G454 Transient global amnesia: Secondary | ICD-10-CM | POA: Insufficient documentation

## 2016-10-28 DIAGNOSIS — Z5181 Encounter for therapeutic drug level monitoring: Secondary | ICD-10-CM | POA: Diagnosis not present

## 2016-10-28 DIAGNOSIS — I330 Acute and subacute infective endocarditis: Secondary | ICD-10-CM | POA: Diagnosis not present

## 2016-10-28 DIAGNOSIS — Z452 Encounter for adjustment and management of vascular access device: Secondary | ICD-10-CM | POA: Diagnosis not present

## 2016-10-28 DIAGNOSIS — A4101 Sepsis due to Methicillin susceptible Staphylococcus aureus: Secondary | ICD-10-CM | POA: Diagnosis not present

## 2016-10-28 DIAGNOSIS — Z7982 Long term (current) use of aspirin: Secondary | ICD-10-CM | POA: Diagnosis not present

## 2016-10-28 DIAGNOSIS — T826XXA Infection and inflammatory reaction due to cardiac valve prosthesis, initial encounter: Secondary | ICD-10-CM | POA: Diagnosis not present

## 2016-10-28 DIAGNOSIS — Z792 Long term (current) use of antibiotics: Secondary | ICD-10-CM | POA: Diagnosis not present

## 2016-10-28 DIAGNOSIS — K219 Gastro-esophageal reflux disease without esophagitis: Secondary | ICD-10-CM | POA: Diagnosis not present

## 2016-10-28 DIAGNOSIS — I4891 Unspecified atrial fibrillation: Secondary | ICD-10-CM | POA: Diagnosis not present

## 2016-10-28 DIAGNOSIS — J189 Pneumonia, unspecified organism: Secondary | ICD-10-CM | POA: Diagnosis not present

## 2016-10-30 DIAGNOSIS — A4101 Sepsis due to Methicillin susceptible Staphylococcus aureus: Secondary | ICD-10-CM | POA: Diagnosis not present

## 2016-10-30 DIAGNOSIS — Z452 Encounter for adjustment and management of vascular access device: Secondary | ICD-10-CM | POA: Diagnosis not present

## 2016-10-30 DIAGNOSIS — E1140 Type 2 diabetes mellitus with diabetic neuropathy, unspecified: Secondary | ICD-10-CM | POA: Diagnosis not present

## 2016-10-30 DIAGNOSIS — Z7984 Long term (current) use of oral hypoglycemic drugs: Secondary | ICD-10-CM | POA: Diagnosis not present

## 2016-10-30 DIAGNOSIS — K219 Gastro-esophageal reflux disease without esophagitis: Secondary | ICD-10-CM | POA: Diagnosis not present

## 2016-10-30 DIAGNOSIS — I4891 Unspecified atrial fibrillation: Secondary | ICD-10-CM | POA: Diagnosis not present

## 2016-10-30 DIAGNOSIS — E785 Hyperlipidemia, unspecified: Secondary | ICD-10-CM | POA: Diagnosis not present

## 2016-10-30 DIAGNOSIS — Z7982 Long term (current) use of aspirin: Secondary | ICD-10-CM | POA: Diagnosis not present

## 2016-10-30 DIAGNOSIS — I2510 Atherosclerotic heart disease of native coronary artery without angina pectoris: Secondary | ICD-10-CM | POA: Diagnosis not present

## 2016-10-30 DIAGNOSIS — J189 Pneumonia, unspecified organism: Secondary | ICD-10-CM | POA: Diagnosis not present

## 2016-10-30 DIAGNOSIS — Z792 Long term (current) use of antibiotics: Secondary | ICD-10-CM | POA: Diagnosis not present

## 2016-10-30 DIAGNOSIS — Z7901 Long term (current) use of anticoagulants: Secondary | ICD-10-CM | POA: Diagnosis not present

## 2016-10-30 DIAGNOSIS — R339 Retention of urine, unspecified: Secondary | ICD-10-CM | POA: Diagnosis not present

## 2016-10-30 DIAGNOSIS — G894 Chronic pain syndrome: Secondary | ICD-10-CM | POA: Diagnosis not present

## 2016-10-30 DIAGNOSIS — Z5181 Encounter for therapeutic drug level monitoring: Secondary | ICD-10-CM | POA: Diagnosis not present

## 2016-11-02 ENCOUNTER — Ambulatory Visit: Payer: Self-pay | Admitting: Urology

## 2016-11-02 DIAGNOSIS — Z7689 Persons encountering health services in other specified circumstances: Secondary | ICD-10-CM | POA: Diagnosis not present

## 2016-11-02 DIAGNOSIS — Z5181 Encounter for therapeutic drug level monitoring: Secondary | ICD-10-CM | POA: Diagnosis not present

## 2016-11-02 DIAGNOSIS — Z452 Encounter for adjustment and management of vascular access device: Secondary | ICD-10-CM | POA: Diagnosis not present

## 2016-11-02 DIAGNOSIS — Z792 Long term (current) use of antibiotics: Secondary | ICD-10-CM | POA: Diagnosis not present

## 2016-11-02 DIAGNOSIS — T826XXA Infection and inflammatory reaction due to cardiac valve prosthesis, initial encounter: Secondary | ICD-10-CM | POA: Diagnosis not present

## 2016-11-02 DIAGNOSIS — K219 Gastro-esophageal reflux disease without esophagitis: Secondary | ICD-10-CM | POA: Diagnosis not present

## 2016-11-02 DIAGNOSIS — J189 Pneumonia, unspecified organism: Secondary | ICD-10-CM | POA: Diagnosis not present

## 2016-11-02 DIAGNOSIS — A4101 Sepsis due to Methicillin susceptible Staphylococcus aureus: Secondary | ICD-10-CM | POA: Diagnosis not present

## 2016-11-02 DIAGNOSIS — Z7982 Long term (current) use of aspirin: Secondary | ICD-10-CM | POA: Diagnosis not present

## 2016-11-03 DIAGNOSIS — Z7901 Long term (current) use of anticoagulants: Secondary | ICD-10-CM | POA: Diagnosis not present

## 2016-11-03 DIAGNOSIS — E785 Hyperlipidemia, unspecified: Secondary | ICD-10-CM | POA: Diagnosis not present

## 2016-11-03 DIAGNOSIS — Z7984 Long term (current) use of oral hypoglycemic drugs: Secondary | ICD-10-CM | POA: Diagnosis not present

## 2016-11-03 DIAGNOSIS — R339 Retention of urine, unspecified: Secondary | ICD-10-CM | POA: Diagnosis not present

## 2016-11-03 DIAGNOSIS — E1140 Type 2 diabetes mellitus with diabetic neuropathy, unspecified: Secondary | ICD-10-CM | POA: Diagnosis not present

## 2016-11-03 DIAGNOSIS — K219 Gastro-esophageal reflux disease without esophagitis: Secondary | ICD-10-CM | POA: Diagnosis not present

## 2016-11-03 DIAGNOSIS — G894 Chronic pain syndrome: Secondary | ICD-10-CM | POA: Diagnosis not present

## 2016-11-03 DIAGNOSIS — I4891 Unspecified atrial fibrillation: Secondary | ICD-10-CM | POA: Diagnosis not present

## 2016-11-03 DIAGNOSIS — J189 Pneumonia, unspecified organism: Secondary | ICD-10-CM | POA: Diagnosis not present

## 2016-11-03 DIAGNOSIS — I2510 Atherosclerotic heart disease of native coronary artery without angina pectoris: Secondary | ICD-10-CM | POA: Diagnosis not present

## 2016-11-03 DIAGNOSIS — Z452 Encounter for adjustment and management of vascular access device: Secondary | ICD-10-CM | POA: Diagnosis not present

## 2016-11-03 DIAGNOSIS — Z7982 Long term (current) use of aspirin: Secondary | ICD-10-CM | POA: Diagnosis not present

## 2016-11-03 DIAGNOSIS — Z5181 Encounter for therapeutic drug level monitoring: Secondary | ICD-10-CM | POA: Diagnosis not present

## 2016-11-03 DIAGNOSIS — Z792 Long term (current) use of antibiotics: Secondary | ICD-10-CM | POA: Diagnosis not present

## 2016-11-03 DIAGNOSIS — A4101 Sepsis due to Methicillin susceptible Staphylococcus aureus: Secondary | ICD-10-CM | POA: Diagnosis not present

## 2016-11-06 LAB — FUNGUS CULTURE WITH STAIN

## 2016-11-06 LAB — FUNGAL ORGANISM REFLEX

## 2016-11-06 LAB — FUNGUS CULTURE RESULT

## 2016-11-09 DIAGNOSIS — I058 Other rheumatic mitral valve diseases: Secondary | ICD-10-CM | POA: Diagnosis not present

## 2016-11-09 DIAGNOSIS — I872 Venous insufficiency (chronic) (peripheral): Secondary | ICD-10-CM | POA: Diagnosis not present

## 2016-11-09 DIAGNOSIS — A4101 Sepsis due to Methicillin susceptible Staphylococcus aureus: Secondary | ICD-10-CM | POA: Diagnosis not present

## 2016-11-09 DIAGNOSIS — K219 Gastro-esophageal reflux disease without esophagitis: Secondary | ICD-10-CM | POA: Diagnosis not present

## 2016-11-09 DIAGNOSIS — T826XXA Infection and inflammatory reaction due to cardiac valve prosthesis, initial encounter: Secondary | ICD-10-CM | POA: Diagnosis not present

## 2016-11-09 DIAGNOSIS — Z452 Encounter for adjustment and management of vascular access device: Secondary | ICD-10-CM | POA: Diagnosis not present

## 2016-11-09 DIAGNOSIS — Z792 Long term (current) use of antibiotics: Secondary | ICD-10-CM | POA: Diagnosis not present

## 2016-11-09 DIAGNOSIS — Z7982 Long term (current) use of aspirin: Secondary | ICD-10-CM | POA: Diagnosis not present

## 2016-11-09 DIAGNOSIS — Z5181 Encounter for therapeutic drug level monitoring: Secondary | ICD-10-CM | POA: Diagnosis not present

## 2016-11-09 DIAGNOSIS — J189 Pneumonia, unspecified organism: Secondary | ICD-10-CM | POA: Diagnosis not present

## 2016-11-10 ENCOUNTER — Other Ambulatory Visit: Payer: Self-pay | Admitting: Internal Medicine

## 2016-11-10 DIAGNOSIS — I10 Essential (primary) hypertension: Secondary | ICD-10-CM | POA: Diagnosis not present

## 2016-11-10 DIAGNOSIS — Z452 Encounter for adjustment and management of vascular access device: Secondary | ICD-10-CM | POA: Diagnosis not present

## 2016-11-10 DIAGNOSIS — I058 Other rheumatic mitral valve diseases: Secondary | ICD-10-CM | POA: Diagnosis not present

## 2016-11-10 DIAGNOSIS — Z79899 Other long term (current) drug therapy: Secondary | ICD-10-CM | POA: Diagnosis not present

## 2016-11-10 DIAGNOSIS — Z5181 Encounter for therapeutic drug level monitoring: Secondary | ICD-10-CM | POA: Diagnosis not present

## 2016-11-10 DIAGNOSIS — K219 Gastro-esophageal reflux disease without esophagitis: Secondary | ICD-10-CM | POA: Diagnosis not present

## 2016-11-10 DIAGNOSIS — F4489 Other dissociative and conversion disorders: Secondary | ICD-10-CM

## 2016-11-10 DIAGNOSIS — Z7982 Long term (current) use of aspirin: Secondary | ICD-10-CM | POA: Diagnosis not present

## 2016-11-10 DIAGNOSIS — J189 Pneumonia, unspecified organism: Secondary | ICD-10-CM | POA: Diagnosis not present

## 2016-11-10 DIAGNOSIS — E1140 Type 2 diabetes mellitus with diabetic neuropathy, unspecified: Secondary | ICD-10-CM | POA: Diagnosis not present

## 2016-11-10 DIAGNOSIS — J181 Lobar pneumonia, unspecified organism: Secondary | ICD-10-CM | POA: Diagnosis not present

## 2016-11-10 DIAGNOSIS — Z792 Long term (current) use of antibiotics: Secondary | ICD-10-CM | POA: Diagnosis not present

## 2016-11-10 DIAGNOSIS — A4101 Sepsis due to Methicillin susceptible Staphylococcus aureus: Secondary | ICD-10-CM | POA: Diagnosis not present

## 2016-11-10 DIAGNOSIS — I330 Acute and subacute infective endocarditis: Secondary | ICD-10-CM | POA: Diagnosis not present

## 2016-11-17 DIAGNOSIS — A4101 Sepsis due to Methicillin susceptible Staphylococcus aureus: Secondary | ICD-10-CM | POA: Diagnosis not present

## 2016-11-17 DIAGNOSIS — L853 Xerosis cutis: Secondary | ICD-10-CM | POA: Diagnosis not present

## 2016-11-17 DIAGNOSIS — K219 Gastro-esophageal reflux disease without esophagitis: Secondary | ICD-10-CM | POA: Diagnosis not present

## 2016-11-17 DIAGNOSIS — Z79899 Other long term (current) drug therapy: Secondary | ICD-10-CM | POA: Diagnosis not present

## 2016-11-17 DIAGNOSIS — B351 Tinea unguium: Secondary | ICD-10-CM | POA: Diagnosis not present

## 2016-11-17 DIAGNOSIS — E1140 Type 2 diabetes mellitus with diabetic neuropathy, unspecified: Secondary | ICD-10-CM | POA: Diagnosis not present

## 2016-11-17 DIAGNOSIS — Z7982 Long term (current) use of aspirin: Secondary | ICD-10-CM | POA: Diagnosis not present

## 2016-11-17 DIAGNOSIS — J189 Pneumonia, unspecified organism: Secondary | ICD-10-CM | POA: Diagnosis not present

## 2016-11-17 DIAGNOSIS — Z452 Encounter for adjustment and management of vascular access device: Secondary | ICD-10-CM | POA: Diagnosis not present

## 2016-11-17 DIAGNOSIS — Z792 Long term (current) use of antibiotics: Secondary | ICD-10-CM | POA: Diagnosis not present

## 2016-11-17 DIAGNOSIS — Z5181 Encounter for therapeutic drug level monitoring: Secondary | ICD-10-CM | POA: Diagnosis not present

## 2016-11-17 DIAGNOSIS — I10 Essential (primary) hypertension: Secondary | ICD-10-CM | POA: Diagnosis not present

## 2016-11-18 ENCOUNTER — Encounter: Payer: Self-pay | Admitting: Emergency Medicine

## 2016-11-18 ENCOUNTER — Emergency Department: Payer: PPO

## 2016-11-18 ENCOUNTER — Inpatient Hospital Stay
Admission: EM | Admit: 2016-11-18 | Discharge: 2016-11-24 | DRG: 871 | Disposition: A | Discharge status: Home Health | Payer: PPO | Attending: Internal Medicine | Admitting: Internal Medicine

## 2016-11-18 DIAGNOSIS — R509 Fever, unspecified: Secondary | ICD-10-CM | POA: Diagnosis not present

## 2016-11-18 DIAGNOSIS — R262 Difficulty in walking, not elsewhere classified: Secondary | ICD-10-CM | POA: Diagnosis not present

## 2016-11-18 DIAGNOSIS — N170 Acute kidney failure with tubular necrosis: Secondary | ICD-10-CM | POA: Diagnosis not present

## 2016-11-18 DIAGNOSIS — A4101 Sepsis due to Methicillin susceptible Staphylococcus aureus: Principal | ICD-10-CM | POA: Diagnosis present

## 2016-11-18 DIAGNOSIS — I482 Chronic atrial fibrillation: Secondary | ICD-10-CM | POA: Diagnosis not present

## 2016-11-18 DIAGNOSIS — R000 Tachycardia, unspecified: Secondary | ICD-10-CM | POA: Diagnosis present

## 2016-11-18 DIAGNOSIS — Z953 Presence of xenogenic heart valve: Secondary | ICD-10-CM | POA: Diagnosis not present

## 2016-11-18 DIAGNOSIS — E1140 Type 2 diabetes mellitus with diabetic neuropathy, unspecified: Secondary | ICD-10-CM | POA: Diagnosis present

## 2016-11-18 DIAGNOSIS — R32 Unspecified urinary incontinence: Secondary | ICD-10-CM | POA: Diagnosis present

## 2016-11-18 DIAGNOSIS — R6521 Severe sepsis with septic shock: Secondary | ICD-10-CM | POA: Diagnosis present

## 2016-11-18 DIAGNOSIS — E872 Acidosis: Secondary | ICD-10-CM | POA: Diagnosis not present

## 2016-11-18 DIAGNOSIS — Z452 Encounter for adjustment and management of vascular access device: Secondary | ICD-10-CM

## 2016-11-18 DIAGNOSIS — A419 Sepsis, unspecified organism: Secondary | ICD-10-CM | POA: Diagnosis present

## 2016-11-18 DIAGNOSIS — R4182 Altered mental status, unspecified: Secondary | ICD-10-CM | POA: Diagnosis not present

## 2016-11-18 DIAGNOSIS — I5033 Acute on chronic diastolic (congestive) heart failure: Secondary | ICD-10-CM | POA: Diagnosis present

## 2016-11-18 DIAGNOSIS — I110 Hypertensive heart disease with heart failure: Secondary | ICD-10-CM | POA: Diagnosis not present

## 2016-11-18 DIAGNOSIS — E785 Hyperlipidemia, unspecified: Secondary | ICD-10-CM | POA: Diagnosis not present

## 2016-11-18 DIAGNOSIS — I509 Heart failure, unspecified: Secondary | ICD-10-CM | POA: Diagnosis not present

## 2016-11-18 DIAGNOSIS — Z79891 Long term (current) use of opiate analgesic: Secondary | ICD-10-CM | POA: Diagnosis not present

## 2016-11-18 DIAGNOSIS — K219 Gastro-esophageal reflux disease without esophagitis: Secondary | ICD-10-CM | POA: Diagnosis present

## 2016-11-18 DIAGNOSIS — I2510 Atherosclerotic heart disease of native coronary artery without angina pectoris: Secondary | ICD-10-CM | POA: Diagnosis present

## 2016-11-18 DIAGNOSIS — I330 Acute and subacute infective endocarditis: Secondary | ICD-10-CM | POA: Diagnosis present

## 2016-11-18 DIAGNOSIS — F419 Anxiety disorder, unspecified: Secondary | ICD-10-CM | POA: Diagnosis present

## 2016-11-18 DIAGNOSIS — J9690 Respiratory failure, unspecified, unspecified whether with hypoxia or hypercapnia: Secondary | ICD-10-CM

## 2016-11-18 DIAGNOSIS — Z7901 Long term (current) use of anticoagulants: Secondary | ICD-10-CM

## 2016-11-18 DIAGNOSIS — I480 Paroxysmal atrial fibrillation: Secondary | ICD-10-CM | POA: Diagnosis not present

## 2016-11-18 DIAGNOSIS — Z881 Allergy status to other antibiotic agents status: Secondary | ICD-10-CM | POA: Diagnosis not present

## 2016-11-18 DIAGNOSIS — Z7982 Long term (current) use of aspirin: Secondary | ICD-10-CM | POA: Diagnosis not present

## 2016-11-18 DIAGNOSIS — Z888 Allergy status to other drugs, medicaments and biological substances status: Secondary | ICD-10-CM | POA: Diagnosis not present

## 2016-11-18 DIAGNOSIS — I4892 Unspecified atrial flutter: Secondary | ICD-10-CM | POA: Diagnosis not present

## 2016-11-18 DIAGNOSIS — J9600 Acute respiratory failure, unspecified whether with hypoxia or hypercapnia: Secondary | ICD-10-CM | POA: Diagnosis not present

## 2016-11-18 DIAGNOSIS — Z79899 Other long term (current) drug therapy: Secondary | ICD-10-CM

## 2016-11-18 DIAGNOSIS — J9621 Acute and chronic respiratory failure with hypoxia: Secondary | ICD-10-CM | POA: Diagnosis present

## 2016-11-18 DIAGNOSIS — Z95828 Presence of other vascular implants and grafts: Secondary | ICD-10-CM

## 2016-11-18 LAB — CBC WITH DIFFERENTIAL/PLATELET
BASOS ABS: 0.2 10*3/uL — AB (ref 0–0.1)
BASOS PCT: 1 %
Eosinophils Absolute: 0.2 10*3/uL (ref 0–0.7)
Eosinophils Relative: 2 %
HEMATOCRIT: 34.5 % — AB (ref 40.0–52.0)
HEMOGLOBIN: 11.8 g/dL — AB (ref 13.0–18.0)
Lymphocytes Relative: 4 %
Lymphs Abs: 0.5 10*3/uL — ABNORMAL LOW (ref 1.0–3.6)
MCH: 28.6 pg (ref 26.0–34.0)
MCHC: 34.3 g/dL (ref 32.0–36.0)
MCV: 83.5 fL (ref 80.0–100.0)
Monocytes Absolute: 0.7 10*3/uL (ref 0.2–1.0)
Monocytes Relative: 6 %
NEUTROS ABS: 10.9 10*3/uL — AB (ref 1.4–6.5)
NEUTROS PCT: 87 %
Platelets: 250 10*3/uL (ref 150–440)
RBC: 4.14 MIL/uL — ABNORMAL LOW (ref 4.40–5.90)
RDW: 15.9 % — ABNORMAL HIGH (ref 11.5–14.5)
WBC: 12.5 10*3/uL — AB (ref 3.8–10.6)

## 2016-11-18 LAB — PROTIME-INR
INR: 1.47
PROTHROMBIN TIME: 18 s — AB (ref 11.4–15.2)

## 2016-11-18 MED ORDER — ACETAMINOPHEN 500 MG PO TABS
1000.0000 mg | ORAL_TABLET | Freq: Once | ORAL | Status: AC
  Administered 2016-11-18: 1000 mg via ORAL

## 2016-11-18 MED ORDER — SODIUM CHLORIDE 0.9 % IV BOLUS (SEPSIS)
1000.0000 mL | Freq: Once | INTRAVENOUS | Status: AC
  Administered 2016-11-18: 1000 mL via INTRAVENOUS

## 2016-11-18 MED ORDER — VANCOMYCIN HCL IN DEXTROSE 1-5 GM/200ML-% IV SOLN
1000.0000 mg | Freq: Once | INTRAVENOUS | Status: AC
  Administered 2016-11-18: 1000 mg via INTRAVENOUS
  Filled 2016-11-18: qty 200

## 2016-11-18 MED ORDER — PIPERACILLIN-TAZOBACTAM 3.375 G IVPB 30 MIN
3.3750 g | Freq: Once | INTRAVENOUS | Status: AC
  Administered 2016-11-18: 3.375 g via INTRAVENOUS
  Filled 2016-11-18: qty 50

## 2016-11-18 MED ORDER — SODIUM CHLORIDE 0.9 % IV BOLUS (SEPSIS)
1000.0000 mL | Freq: Once | INTRAVENOUS | Status: AC
  Administered 2016-11-19: 1000 mL via INTRAVENOUS

## 2016-11-18 MED ORDER — ACETAMINOPHEN 500 MG PO TABS
ORAL_TABLET | ORAL | Status: AC
  Administered 2016-11-18: 1000 mg via ORAL
  Filled 2016-11-18: qty 2

## 2016-11-18 NOTE — ED Provider Notes (Signed)
[facility] Emergency Department Provider Note   First MD Initiated Contact with Patient 11/18/16 2328     (approximate)  I have reviewed the triage vital signs and the nursing notes.  History obtained from EMS secondary to patient's altered mental status HISTORY  Chief Complaint Code Sepsis    HPI [name] is a 71 y.o. male bolus of chronic medical conditions with history per EMS of recently have a PICC line removed for which she was receiving IV antibiotics presents to the emergency department with fever 103 for EMS tachycardia with heart rate is 130 and altered mental status today   Past Medical History:  Diagnosis Date  . Chronic pain   . Coronary artery disease   . Diabetes mellitus without complication ([place])   . DJD (degenerative joint disease)   . GERD (gastroesophageal reflux disease)   . Hyperlipemia   . Hypertension   . Insomnia   . Neuropathy [facility])     Patient Active Problem List   Diagnosis Date Noted  . Septic shock ([place]) 11/19/2016  . Pressure injury of skin 10/03/2016  . Sepsis ([place]) 10/02/2016    Past Surgical History:  Procedure Laterality Date  . BACK SURGERY    . CARDIAC SURGERY    . JOINT REPLACEMENT    . TEE WITHOUT CARDIOVERSION N/A 10/08/2016   Procedure: TRANSESOPHAGEAL ECHOCARDIOGRAM (TEE);  Surgeon: Teodoro Spray, MD;  Location: [facility] ORS;  Service: Cardiovascular;  Laterality: N/A;    Prior to Admission medications   Medication Sig Start Date End Date Taking? Authorizing Provider  amiodarone (PACERONE) 200 MG tablet Take 1 tablet (200 mg total) by mouth daily. 10/09/16  Yes Fritzi Mandes, MD  aspirin EC 81 MG tablet Take 1 tablet by mouth daily.   Yes Historical Provider, MD  Cyanocobalamin (RA VITAMIN B-12 TR) 1000 MCG TBCR Take 1 tablet by mouth daily.   Yes Historical Provider, MD  cyclobenzaprine (FLEXERIL) 10 MG tablet Take 1 tablet by mouth daily.   Yes Historical Provider, MD  DULoxetine (CYMBALTA)  60 MG capsule Take 1 capsule by mouth at bedtime. 07/29/15  Yes Historical Provider, MD  furosemide (LASIX) 40 MG tablet Take 1 tablet by mouth 2 (two) times daily. 03/19/16  Yes Historical Provider, MD  gabapentin (NEURONTIN) 600 MG tablet Take 1 tablet by mouth 3 (three) times daily. 11/21/12  Yes Historical Provider, MD  glimepiride (AMARYL) 2 MG tablet Take 2 mg by mouth daily.   Yes Historical Provider, MD  ipratropium-albuterol (DUONEB) 0.5-2.5 (3) MG/3ML SOLN Take 3 mLs by nebulization every 6 (six) hours as needed. 10/09/16  Yes Fritzi Mandes, MD  KLOR-CON M20 20 MEQ tablet Take 1 tablet by mouth daily.   Yes Historical Provider, MD  lisinopril (PRINIVIL,ZESTRIL) 10 MG tablet Take 1 tablet by mouth daily. 02/10/16  Yes Historical Provider, MD  Loratadine 10 MG CAPS Take 1 capsule by mouth daily.   Yes Historical Provider, MD  meclizine (ANTIVERT) 25 MG tablet Take 1 tablet by mouth 3 (three) times daily. 03/19/16  Yes Historical Provider, MD  metFORMIN (GLUCOPHAGE) 1000 MG tablet Take 1 tablet by mouth 2 (two) times daily. With meals. 03/19/16  Yes Historical Provider, MD  methadone (DOLOPHINE) 5 MG tablet Take 1 tablet by mouth 3 (three) times daily. 09/02/16  Yes Historical Provider, MD  metoprolol tartrate (LOPRESSOR) 25 MG tablet Take 1 tablet by mouth 2 (two) times daily. 07/20/16  Yes Historical Provider, MD  Multiple Vitamin (MULTI-VITAMINS) TABS Take  1 tablet by mouth daily.   Yes Historical Provider, MD  omeprazole (PRILOSEC) 20 MG capsule Take 20 mg by mouth daily.   Yes Historical Provider, MD  Oxcarbazepine (TRILEPTAL) 300 MG tablet Take 1 tablet by mouth 2 (two) times daily. 12/05/12  Yes Historical Provider, MD  pravastatin (PRAVACHOL) 40 MG tablet Take 1 tablet by mouth at bedtime. 02/10/16  Yes Historical Provider, MD  rivaroxaban (XARELTO) 20 MG TABS tablet Take 1 tablet (20 mg total) by mouth daily with supper. 10/09/16  Yes Fritzi Mandes, MD  tamsulosin (FLOMAX) 0.4 MG CAPS capsule Take 1  capsule (0.4 mg total) by mouth daily. 10/09/16  Yes Fritzi Mandes, MD  feeding supplement, ENSURE ENLIVE, (ENSURE ENLIVE) LIQD Take 237 mLs by mouth 2 (two) times daily between meals. 10/09/16   Fritzi Mandes, MD  polyethylene glycol (MIRALAX / GLYCOLAX) packet Take 17 g by mouth daily. 10/09/16   Fritzi Mandes, MD    Allergies Rofecoxib and Tetracyclines & related  History reviewed. No pertinent family history.  Social History Social History  Substance Use Topics  . Smoking status: Never Smoker  . Smokeless tobacco: Never Used  . Alcohol use No    Review of Systems Constitutional: Positive for fever/chills Eyes: No visual changes. ENT: No sore throat. Cardiovascular: Denies chest pain. Respiratory: Denies shortness of breath. Gastrointestinal: No abdominal pain.  No nausea, no vomiting.  No diarrhea.  No constipation. Genitourinary: Negative for dysuria. Musculoskeletal: Negative for back pain. Skin: Negative for rash. Neurological: Positive for altered mental status  10-point ROS otherwise negative.  ____________________________________________   PHYSICAL EXAM:  VITAL SIGNS: ED Triage Vitals  Enc Vitals Group     BP 11/18/16 2323 (!) 141/84     Pulse Rate 11/18/16 2323 (!) 125     Resp 11/18/16 2323 (!) 31     Temp 11/18/16 2323 (!) 103.1 F (39.5 C)     Temp Source 11/18/16 2323 Oral     SpO2 11/18/16 2323 100 %     Weight 11/18/16 2324 278 lb 14.1 oz (126.5 kg)     Height 11/18/16 2324 '5\' 11"'$  (1.803 m)     Head Circumference --      Peak Flow --      Pain Score 11/18/16 2325 0     Pain Loc --      Pain Edu? --      Excl. in [place]? --     Constitutional: Alert and Oriented to name, disoriented to date year and age. Eyes: Conjunctivae are normal. PERRL. EOMI. Head: Atraumatic. Ears:  Healthy appearing ear canals and TMs bilaterally Nose: No congestion/rhinnorhea. Mouth/Throat: Mucous membranes are moist.  Oropharynx non-erythematous. Neck: No stridor.  No  meningeal signs. Cardiovascular: Tachycardia, regular rhythm. Good peripheral circulation. Grossly normal heart sounds. Respiratory: Tachypnea positive accessory respiratory muscle use. Diffuse rhonchi  Gastrointestinal: Soft and nontender. No distention.  Musculoskeletal: No lower extremity tenderness nor edema. No gross deformities of extremities. Neurologic:  Normal speech and language. No gross focal neurologic deficits are appreciated.  Skin:  Skin is hot to touch No rash noted. Psychiatric: Mood and affect are normal. Speech and behavior are normal.  ____________________________________________   LABS (all labs ordered are listed, but only abnormal results are displayed)  Labs Reviewed  COMPREHENSIVE METABOLIC PANEL - Abnormal; Notable for the following:       Result Value   Chloride 98 (*)    Glucose, Bld 134 (*)    Calcium 8.7 (*)  Total Protein 8.5 (*)    ALT 11 (*)    All other components within normal limits  LACTIC ACID, PLASMA - Abnormal; Notable for the following:    Lactic Acid, Venous 2.4 (*)    All other components within normal limits  LACTIC ACID, PLASMA - Abnormal; Notable for the following:    Lactic Acid, Venous 3.0 (*)    All other components within normal limits  CBC WITH DIFFERENTIAL/PLATELET - Abnormal; Notable for the following:    WBC 12.5 (*)    RBC 4.14 (*)    Hemoglobin 11.8 (*)    HCT 34.5 (*)    RDW 15.9 (*)    Neutro Abs 10.9 (*)    Lymphs Abs 0.5 (*)    Basophils Absolute 0.2 (*)    All other components within normal limits  PROTIME-INR - Abnormal; Notable for the following:    Prothrombin Time 18.0 (*)    All other components within normal limits  URINALYSIS, COMPLETE (UACMP) WITH MICROSCOPIC - Abnormal; Notable for the following:    Color, Urine YELLOW (*)    APPearance CLEAR (*)    Squamous Epithelial / LPF 0-5 (*)    All other components within normal limits  GLUCOSE, CAPILLARY - Abnormal; Notable for the following:     Glucose-Capillary 130 (*)    All other components within normal limits  CULTURE, BLOOD (ROUTINE X 2)  CULTURE, BLOOD (ROUTINE X 2)  URINE CULTURE  MRSA PCR SCREENING  INFLUENZA PANEL BY PCR (TYPE A & B)  PROCALCITONIN   ____________________________________________  EKG  ED ECG REPORT I, [place] N Latavion Halls, the attending physician, personally viewed and interpreted this ECG.   Date: 11/19/2016  EKG Time: 11:23 PM  Rate: 128  Rhythm: Atrial fibrillation with rapid ventricular response.  Axis: Normal  Intervals: Normal  ST&T Change: None  ____________________________________________  RADIOLOGY I, [place] N Aleeah Greeno, personally viewed and evaluated these images (plain radiographs) as part of my medical decision making, as well as reviewing the written report by the radiologist.  Dg Chest Portable 1 View  Result Date: 11/19/2016 CLINICAL DATA:  Sepsis and fever.  Confusion. EXAM: PORTABLE CHEST 1 VIEW COMPARISON:  10/04/2016 FINDINGS: A single AP portable view of the chest demonstrates no focal airspace consolidation or alveolar edema. The lungs are grossly clear. There is no large effusion or pneumothorax. There is unchanged cardiomegaly. Cardiac and mediastinal contours are otherwise unremarkable. IMPRESSION: Mild cardiomegaly, unchanged.  No consolidation or effusion. Electronically Signed   By: Andreas Newport M.D.   On: 11/19/2016 00:19      Procedures     INITIAL IMPRESSION / ASSESSMENT AND PLAN / ED COURSE  Pertinent labs & imaging results that were available during my care of the patient were reviewed by me and considered in my medical decision making (see chart for details).  History of physical exam concerning for sepsis unclear etiology of source. Patient was placed on BiPAP secondary to work of breathing and impending respiratory failure. Patient received IV antibiotics vancomycin and Zosyn on arrival. Patient also received 30 ML's per kilogram of IV normal saline.  Patient discussed with the intensivist for ICU admission.      ____________________________________________  FINAL CLINICAL IMPRESSION(S) / ED DIAGNOSES  Sepsis  MEDICATIONS GIVEN DURING THIS VISIT:  Medications  0.9 %  sodium chloride infusion (not administered)  0.9 %  sodium chloride infusion (not administered)  acetaminophen (TYLENOL) tablet 650 mg (not administered)  piperacillin-tazobactam (ZOSYN) IVPB 3.375 g (not administered)  phenylephrine (NEO-SYNEPHRINE) 10 mg in sodium chloride 0.9 % 250 mL (0.04 mg/mL) infusion (70 mcg/min Intravenous Rate/Dose Change 11/19/16 0329)  ipratropium-albuterol (DUONEB) 0.5-2.5 (3) MG/3ML nebulizer solution 3 mL (not administered)  amiodarone (PACERONE) tablet 200 mg (not administered)  aspirin EC tablet 81 mg (not administered)  DULoxetine (CYMBALTA) DR capsule 60 mg (not administered)  gabapentin (NEURONTIN) tablet 600 mg (not administered)  pantoprazole (PROTONIX) EC tablet 40 mg (not administered)  pravastatin (PRAVACHOL) tablet 40 mg (not administered)  rivaroxaban (XARELTO) tablet 20 mg (not administered)  tamsulosin (FLOMAX) capsule 0.4 mg (not administered)  insulin aspart (novoLOG) injection 2-6 Units (not administered)  norepinephrine (LEVOPHED) 4 mg in dextrose 5 % 250 mL (0.016 mg/mL) infusion (not administered)  chlorhexidine (PERIDEX) 0.12 % solution 15 mL (not administered)  MEDLINE mouth rinse (not administered)  sodium chloride 0.9 % bolus 1,000 mL (0 mLs Intravenous Stopped 11/19/16 0021)    And  sodium chloride 0.9 % bolus 1,000 mL (0 mLs Intravenous Stopped 11/19/16 0021)    And  sodium chloride 0.9 % bolus 1,000 mL (0 mLs Intravenous Stopped 11/19/16 0153)    And  sodium chloride 0.9 % bolus 1,000 mL (0 mLs Intravenous Stopped 11/19/16 0103)  piperacillin-tazobactam (ZOSYN) IVPB 3.375 g (0 g Intravenous Stopped 11/19/16 0021)  vancomycin (VANCOCIN) IVPB 1000 mg/200 mL premix (0 mg Intravenous Stopped 11/19/16 0049)    acetaminophen (TYLENOL) tablet 1,000 mg (1,000 mg Oral Given 11/18/16 2342)  LORazepam (ATIVAN) injection 1 mg (1 mg Intravenous Given 11/19/16 0045)  LORazepam (ATIVAN) injection 1 mg (1 mg Intravenous Given 11/19/16 0050)  ketorolac (TORADOL) 30 MG/ML injection 30 mg (30 mg Intravenous Given 11/19/16 0100)  sodium chloride 0.9 % bolus 1,000 mL (0 mLs Intravenous Stopped 11/19/16 0329)  fentaNYL (SUBLIMAZE) injection 50 mcg (50 mcg Intravenous Given 11/19/16 0404)     NEW OUTPATIENT MEDICATIONS STARTED DURING THIS VISIT:  Current Discharge Medication List      Current Discharge Medication List      Current Discharge Medication List       Note:  This document was prepared using Dragon voice recognition software and may include unintentional dictation errors.    Gregor Hams, MD 11/19/16 0500

## 2016-11-18 NOTE — ED Triage Notes (Signed)
Pt to rm 12 via EMS from home, EMS report fever, new onset shivering, incontinencce, immobility, and confusion.  RA o2sat in 70s, HR low 100s, BP 150/86.  Pt recently finished 38 day cycle of antibiotics via Picc, picc removed recently.  Pt denies pain.  Pt oriented to self only

## 2016-11-19 ENCOUNTER — Inpatient Hospital Stay: Payer: PPO

## 2016-11-19 ENCOUNTER — Inpatient Hospital Stay
Admit: 2016-11-19 | Discharge: 2016-11-19 | Disposition: A | Discharge status: Home / Self Care | Payer: PPO | Attending: Critical Care Medicine | Admitting: Critical Care Medicine

## 2016-11-19 DIAGNOSIS — M4647 Discitis, unspecified, lumbosacral region: Secondary | ICD-10-CM | POA: Diagnosis not present

## 2016-11-19 DIAGNOSIS — Z888 Allergy status to other drugs, medicaments and biological substances status: Secondary | ICD-10-CM | POA: Diagnosis not present

## 2016-11-19 DIAGNOSIS — T826XXA Infection and inflammatory reaction due to cardiac valve prosthesis, initial encounter: Secondary | ICD-10-CM | POA: Diagnosis not present

## 2016-11-19 DIAGNOSIS — Z7982 Long term (current) use of aspirin: Secondary | ICD-10-CM | POA: Diagnosis not present

## 2016-11-19 DIAGNOSIS — I482 Chronic atrial fibrillation: Secondary | ICD-10-CM | POA: Diagnosis not present

## 2016-11-19 DIAGNOSIS — I5033 Acute on chronic diastolic (congestive) heart failure: Secondary | ICD-10-CM | POA: Diagnosis not present

## 2016-11-19 DIAGNOSIS — N170 Acute kidney failure with tubular necrosis: Secondary | ICD-10-CM | POA: Diagnosis not present

## 2016-11-19 DIAGNOSIS — A419 Sepsis, unspecified organism: Secondary | ICD-10-CM | POA: Diagnosis not present

## 2016-11-19 DIAGNOSIS — E1140 Type 2 diabetes mellitus with diabetic neuropathy, unspecified: Secondary | ICD-10-CM | POA: Diagnosis not present

## 2016-11-19 DIAGNOSIS — J189 Pneumonia, unspecified organism: Secondary | ICD-10-CM | POA: Diagnosis not present

## 2016-11-19 DIAGNOSIS — Z452 Encounter for adjustment and management of vascular access device: Secondary | ICD-10-CM | POA: Diagnosis not present

## 2016-11-19 DIAGNOSIS — R6521 Severe sepsis with septic shock: Secondary | ICD-10-CM

## 2016-11-19 DIAGNOSIS — R0689 Other abnormalities of breathing: Secondary | ICD-10-CM | POA: Diagnosis not present

## 2016-11-19 DIAGNOSIS — J9621 Acute and chronic respiratory failure with hypoxia: Secondary | ICD-10-CM | POA: Diagnosis not present

## 2016-11-19 DIAGNOSIS — B9561 Methicillin susceptible Staphylococcus aureus infection as the cause of diseases classified elsewhere: Secondary | ICD-10-CM | POA: Diagnosis not present

## 2016-11-19 DIAGNOSIS — Z953 Presence of xenogenic heart valve: Secondary | ICD-10-CM | POA: Diagnosis not present

## 2016-11-19 DIAGNOSIS — I058 Other rheumatic mitral valve diseases: Secondary | ICD-10-CM | POA: Diagnosis not present

## 2016-11-19 DIAGNOSIS — R6520 Severe sepsis without septic shock: Secondary | ICD-10-CM | POA: Diagnosis not present

## 2016-11-19 DIAGNOSIS — I110 Hypertensive heart disease with heart failure: Secondary | ICD-10-CM | POA: Diagnosis not present

## 2016-11-19 DIAGNOSIS — B958 Unspecified staphylococcus as the cause of diseases classified elsewhere: Secondary | ICD-10-CM | POA: Diagnosis not present

## 2016-11-19 DIAGNOSIS — I330 Acute and subacute infective endocarditis: Secondary | ICD-10-CM | POA: Diagnosis not present

## 2016-11-19 DIAGNOSIS — Z79899 Other long term (current) drug therapy: Secondary | ICD-10-CM | POA: Diagnosis not present

## 2016-11-19 DIAGNOSIS — K219 Gastro-esophageal reflux disease without esophagitis: Secondary | ICD-10-CM | POA: Diagnosis not present

## 2016-11-19 DIAGNOSIS — Z792 Long term (current) use of antibiotics: Secondary | ICD-10-CM | POA: Diagnosis not present

## 2016-11-19 DIAGNOSIS — I2510 Atherosclerotic heart disease of native coronary artery without angina pectoris: Secondary | ICD-10-CM | POA: Diagnosis not present

## 2016-11-19 DIAGNOSIS — I38 Endocarditis, valve unspecified: Secondary | ICD-10-CM | POA: Diagnosis not present

## 2016-11-19 DIAGNOSIS — R000 Tachycardia, unspecified: Secondary | ICD-10-CM | POA: Diagnosis not present

## 2016-11-19 DIAGNOSIS — J811 Chronic pulmonary edema: Secondary | ICD-10-CM | POA: Diagnosis not present

## 2016-11-19 DIAGNOSIS — Z5181 Encounter for therapeutic drug level monitoring: Secondary | ICD-10-CM | POA: Diagnosis not present

## 2016-11-19 DIAGNOSIS — I358 Other nonrheumatic aortic valve disorders: Secondary | ICD-10-CM | POA: Diagnosis not present

## 2016-11-19 DIAGNOSIS — E872 Acidosis: Secondary | ICD-10-CM | POA: Diagnosis not present

## 2016-11-19 DIAGNOSIS — J9690 Respiratory failure, unspecified, unspecified whether with hypoxia or hypercapnia: Secondary | ICD-10-CM | POA: Diagnosis not present

## 2016-11-19 DIAGNOSIS — I4891 Unspecified atrial fibrillation: Secondary | ICD-10-CM | POA: Diagnosis not present

## 2016-11-19 DIAGNOSIS — Z79891 Long term (current) use of opiate analgesic: Secondary | ICD-10-CM | POA: Diagnosis not present

## 2016-11-19 DIAGNOSIS — I509 Heart failure, unspecified: Secondary | ICD-10-CM | POA: Diagnosis not present

## 2016-11-19 DIAGNOSIS — E785 Hyperlipidemia, unspecified: Secondary | ICD-10-CM | POA: Diagnosis not present

## 2016-11-19 DIAGNOSIS — Z881 Allergy status to other antibiotic agents status: Secondary | ICD-10-CM | POA: Diagnosis not present

## 2016-11-19 DIAGNOSIS — Z7901 Long term (current) use of anticoagulants: Secondary | ICD-10-CM | POA: Diagnosis not present

## 2016-11-19 DIAGNOSIS — I480 Paroxysmal atrial fibrillation: Secondary | ICD-10-CM | POA: Diagnosis not present

## 2016-11-19 DIAGNOSIS — A4101 Sepsis due to Methicillin susceptible Staphylococcus aureus: Secondary | ICD-10-CM | POA: Diagnosis not present

## 2016-11-19 DIAGNOSIS — I4892 Unspecified atrial flutter: Secondary | ICD-10-CM | POA: Diagnosis not present

## 2016-11-19 LAB — COMPREHENSIVE METABOLIC PANEL
ALBUMIN: 3.9 g/dL (ref 3.5–5.0)
ALK PHOS: 110 U/L (ref 38–126)
ALT: 11 U/L — AB (ref 17–63)
ANION GAP: 10 (ref 5–15)
AST: 28 U/L (ref 15–41)
BILIRUBIN TOTAL: 0.5 mg/dL (ref 0.3–1.2)
BUN: 17 mg/dL (ref 6–20)
CALCIUM: 8.7 mg/dL — AB (ref 8.9–10.3)
CO2: 27 mmol/L (ref 22–32)
CREATININE: 0.91 mg/dL (ref 0.61–1.24)
Chloride: 98 mmol/L — ABNORMAL LOW (ref 101–111)
GFR calc Af Amer: >60 mL/min (ref >60)
GFR calc non Af Amer: >60 mL/min (ref >60)
GLUCOSE: 134 mg/dL — AB (ref 65–99)
Potassium: 4.3 mmol/L (ref 3.5–5.1)
Sodium: 135 mmol/L (ref 135–145)
TOTAL PROTEIN: 8.5 g/dL — AB (ref 6.5–8.1)

## 2016-11-19 LAB — BLOOD CULTURE ID PANEL (REFLEXED)
ACINETOBACTER BAUMANNII: NOT DETECTED
CANDIDA ALBICANS: NOT DETECTED
CANDIDA GLABRATA: NOT DETECTED
Candida krusei: NOT DETECTED
Candida parapsilosis: NOT DETECTED
Candida tropicalis: NOT DETECTED
ENTEROBACTER CLOACAE COMPLEX: NOT DETECTED
ENTEROBACTERIACEAE SPECIES: NOT DETECTED
ENTEROCOCCUS SPECIES: NOT DETECTED
Escherichia coli: NOT DETECTED
Haemophilus influenzae: NOT DETECTED
Klebsiella oxytoca: NOT DETECTED
Klebsiella pneumoniae: NOT DETECTED
LISTERIA MONOCYTOGENES: NOT DETECTED
Methicillin resistance: NOT DETECTED
NEISSERIA MENINGITIDIS: NOT DETECTED
PSEUDOMONAS AERUGINOSA: NOT DETECTED
Proteus species: NOT DETECTED
STREPTOCOCCUS AGALACTIAE: NOT DETECTED
STREPTOCOCCUS PYOGENES: NOT DETECTED
STREPTOCOCCUS SPECIES: NOT DETECTED
Serratia marcescens: NOT DETECTED
Staphylococcus aureus (BCID): DETECTED — AB
Staphylococcus species: DETECTED — AB
Streptococcus pneumoniae: NOT DETECTED

## 2016-11-19 LAB — URINALYSIS, COMPLETE (UACMP) WITH MICROSCOPIC
BACTERIA UA: NONE SEEN
Bilirubin Urine: NEGATIVE
Glucose, UA: NEGATIVE mg/dL
Hgb urine dipstick: NEGATIVE
Ketones, ur: NEGATIVE mg/dL
Leukocytes, UA: NEGATIVE
NITRITE: NEGATIVE
PROTEIN: NEGATIVE mg/dL
SPECIFIC GRAVITY, URINE: 1.011 (ref 1.005–1.030)
pH: 6 (ref 5.0–8.0)

## 2016-11-19 LAB — MRSA PCR SCREENING: MRSA BY PCR: NEGATIVE

## 2016-11-19 LAB — GLUCOSE, CAPILLARY
GLUCOSE-CAPILLARY: 130 mg/dL — AB (ref 65–99)
GLUCOSE-CAPILLARY: 124 mg/dL — AB (ref 65–99)
GLUCOSE-CAPILLARY: 125 mg/dL — AB (ref 65–99)
GLUCOSE-CAPILLARY: 120 mg/dL — AB (ref 65–99)
Glucose-Capillary: 117 mg/dL — ABNORMAL HIGH (ref 65–99)
Glucose-Capillary: 123 mg/dL — ABNORMAL HIGH (ref 65–99)
Glucose-Capillary: 89 mg/dL (ref 65–99)

## 2016-11-19 LAB — INFLUENZA PANEL BY PCR (TYPE A & B)
INFLAPCR: NEGATIVE
INFLBPCR: NEGATIVE

## 2016-11-19 LAB — PROCALCITONIN: Procalcitonin: 3.84 ng/mL

## 2016-11-19 LAB — LACTIC ACID, PLASMA
Lactic Acid, Venous: 2.4 mmol/L (ref 0.5–1.9)
Lactic Acid, Venous: 3 mmol/L (ref 0.5–1.9)

## 2016-11-19 LAB — ECHOCARDIOGRAM COMPLETE
Height: 70 in
Weight: 4483.27 oz

## 2016-11-19 MED ORDER — METOPROLOL TARTRATE 25 MG PO TABS: 25.0000 mg | ORAL_TABLET | Freq: Two times a day (BID) | ORAL | Status: DC

## 2016-11-19 MED ORDER — SODIUM CHLORIDE 0.9 % IV SOLN
0.0000 ug/min | INTRAVENOUS | Status: DC
  Administered 2016-11-19: 20 ug/min via INTRAVENOUS
  Filled 2016-11-19: qty 1

## 2016-11-19 MED ORDER — TAMSULOSIN HCL 0.4 MG PO CAPS
0.4000 mg | ORAL_CAPSULE | Freq: Every day | ORAL | Status: DC
  Administered 2016-11-20 – 2016-11-22 (×3): 0.4 mg via ORAL
  Filled 2016-11-19 (×6): qty 1

## 2016-11-19 MED ORDER — LORAZEPAM 2 MG/ML IJ SOLN
1.0000 mg | Freq: Once | INTRAMUSCULAR | Status: AC
  Administered 2016-11-19: 1 mg via INTRAVENOUS

## 2016-11-19 MED ORDER — HEPARIN SODIUM (PORCINE) 5000 UNIT/ML IJ SOLN: 5000.0000 [IU] | Freq: Three times a day (TID) | INTRAMUSCULAR | Status: DC

## 2016-11-19 MED ORDER — FENTANYL CITRATE (PF) 100 MCG/2ML IJ SOLN
50.0000 ug | Freq: Once | INTRAMUSCULAR | Status: AC
  Administered 2016-11-19: 50 ug via INTRAVENOUS
  Filled 2016-11-19: qty 2

## 2016-11-19 MED ORDER — KETOROLAC TROMETHAMINE 30 MG/ML IJ SOLN
30.0000 mg | Freq: Once | INTRAMUSCULAR | Status: AC
  Administered 2016-11-19: 30 mg via INTRAVENOUS

## 2016-11-19 MED ORDER — LORAZEPAM 2 MG/ML IJ SOLN
0.5000 mg | INTRAMUSCULAR | Status: AC
  Administered 2016-11-19: 0.5 mg via INTRAVENOUS
  Filled 2016-11-19: qty 1

## 2016-11-19 MED ORDER — PANTOPRAZOLE SODIUM 40 MG PO PACK
20.0000 mg | PACK | Freq: Every day | ORAL | Status: DC
  Administered 2016-11-22 – 2016-11-24 (×2): 20 mg via ORAL
  Filled 2016-11-19 (×5): qty 20

## 2016-11-19 MED ORDER — PANTOPRAZOLE SODIUM 40 MG PO TBEC: 40.0000 mg | DELAYED_RELEASE_TABLET | Freq: Every day | ORAL | Status: DC

## 2016-11-19 MED ORDER — RIVAROXABAN 20 MG PO TABS: 20.0000 mg | ORAL_TABLET | Freq: Every day | ORAL | Status: DC

## 2016-11-19 MED ORDER — DULOXETINE HCL 60 MG PO CPEP
60.0000 mg | ORAL_CAPSULE | Freq: Every day | ORAL | Status: DC
  Administered 2016-11-20 – 2016-11-23 (×4): 60 mg via ORAL
  Filled 2016-11-19 (×4): qty 1

## 2016-11-19 MED ORDER — GABAPENTIN 600 MG PO TABS
600.0000 mg | ORAL_TABLET | Freq: Three times a day (TID) | ORAL | Status: DC
  Administered 2016-11-20 – 2016-11-24 (×13): 600 mg via ORAL
  Filled 2016-11-19 (×15): qty 1

## 2016-11-19 MED ORDER — WARFARIN SODIUM 5 MG PO TABS
5.0000 mg | ORAL_TABLET | Freq: Every day | ORAL | Status: DC
  Administered 2016-11-20 – 2016-11-23 (×4): 5 mg via ORAL
  Filled 2016-11-19 (×4): qty 1

## 2016-11-19 MED ORDER — ORAL CARE MOUTH RINSE
15.0000 mL | Freq: Two times a day (BID) | OROMUCOSAL | Status: DC
  Administered 2016-11-19: 15 mL via OROMUCOSAL

## 2016-11-19 MED ORDER — FUROSEMIDE 40 MG PO TABS: 40.0000 mg | ORAL_TABLET | Freq: Two times a day (BID) | ORAL | Status: DC

## 2016-11-19 MED ORDER — NOREPINEPHRINE BITARTRATE 1 MG/ML IV SOLN
0.0000 ug/min | INTRAVENOUS | Status: DC
  Administered 2016-11-19: 20 ug/min via INTRAVENOUS
  Administered 2016-11-20: 11 ug/min via INTRAVENOUS
  Filled 2016-11-19 (×2): qty 4

## 2016-11-19 MED ORDER — SODIUM CHLORIDE 0.9 % IV BOLUS (SEPSIS)
1000.0000 mL | Freq: Once | INTRAVENOUS | Status: AC
  Administered 2016-11-19: 1000 mL via INTRAVENOUS

## 2016-11-19 MED ORDER — LORAZEPAM 2 MG/ML IJ SOLN
INTRAMUSCULAR | Status: AC
  Administered 2016-11-19: 1 mg via INTRAVENOUS
  Filled 2016-11-19: qty 1

## 2016-11-19 MED ORDER — IPRATROPIUM-ALBUTEROL 0.5-2.5 (3) MG/3ML IN SOLN: 3.0000 mL | RESPIRATORY_TRACT | Status: DC | PRN

## 2016-11-19 MED ORDER — ENOXAPARIN SODIUM 40 MG/0.4ML ~~LOC~~ SOLN: 40.0000 mg | SUBCUTANEOUS | Status: DC

## 2016-11-19 MED ORDER — VANCOMYCIN HCL 10 G IV SOLR
1250.0000 mg | Freq: Two times a day (BID) | INTRAVENOUS | Status: DC
  Filled 2016-11-19 (×2): qty 1250

## 2016-11-19 MED ORDER — GENTAMICIN IN SALINE 1-0.9 MG/ML-% IV SOLN
100.0000 mg | Freq: Three times a day (TID) | INTRAVENOUS | Status: DC
  Filled 2016-11-19 (×2): qty 100

## 2016-11-19 MED ORDER — DEXTROSE 5 % IV SOLN
2.0000 g | Freq: Three times a day (TID) | INTRAVENOUS | Status: DC
  Administered 2016-11-19: 2 g via INTRAVENOUS
  Filled 2016-11-19 (×2): qty 2

## 2016-11-19 MED ORDER — RIFAMPIN 300 MG PO CAPS: 600.0000 mg | ORAL_CAPSULE | Freq: Every day | ORAL | Status: DC

## 2016-11-19 MED ORDER — SODIUM CHLORIDE 0.9 % IV SOLN
INTRAVENOUS | Status: DC
  Administered 2016-11-19: 06:00:00 via INTRAVENOUS

## 2016-11-19 MED ORDER — ACETAMINOPHEN 650 MG RE SUPP
650.0000 mg | RECTAL | Status: DC | PRN
  Administered 2016-11-19: 650 mg via RECTAL
  Filled 2016-11-19: qty 1

## 2016-11-19 MED ORDER — SODIUM CHLORIDE 0.9 % IV SOLN
250.0000 mL | INTRAVENOUS | Status: DC | PRN
  Administered 2016-11-19: 250 mL via INTRAVENOUS
  Administered 2016-11-23: 50 mL via INTRAVENOUS

## 2016-11-19 MED ORDER — ACETAMINOPHEN 325 MG PO TABS: 650.0000 mg | ORAL_TABLET | ORAL | Status: DC | PRN

## 2016-11-19 MED ORDER — ASPIRIN EC 81 MG PO TBEC
81.0000 mg | DELAYED_RELEASE_TABLET | Freq: Every day | ORAL | Status: DC
  Administered 2016-11-20 – 2016-11-24 (×5): 81 mg via ORAL
  Filled 2016-11-19 (×6): qty 1

## 2016-11-19 MED ORDER — KETOROLAC TROMETHAMINE 30 MG/ML IJ SOLN
INTRAMUSCULAR | Status: AC
  Administered 2016-11-19: 30 mg via INTRAVENOUS
  Filled 2016-11-19: qty 1

## 2016-11-19 MED ORDER — PIPERACILLIN-TAZOBACTAM 3.375 G IVPB
3.3750 g | Freq: Three times a day (TID) | INTRAVENOUS | Status: DC
  Administered 2016-11-19: 3.375 g via INTRAVENOUS
  Filled 2016-11-19: qty 50

## 2016-11-19 MED ORDER — INSULIN ASPART 100 UNIT/ML ~~LOC~~ SOLN
2.0000 [IU] | SUBCUTANEOUS | Status: DC
  Administered 2016-11-19 – 2016-11-20 (×4): 2 [IU] via SUBCUTANEOUS
  Filled 2016-11-19 (×5): qty 2

## 2016-11-19 MED ORDER — RIFAMPIN 300 MG PO CAPS
300.0000 mg | ORAL_CAPSULE | Freq: Three times a day (TID) | ORAL | Status: DC
  Administered 2016-11-20 – 2016-11-24 (×13): 300 mg via ORAL
  Filled 2016-11-19 (×16): qty 1

## 2016-11-19 MED ORDER — WARFARIN - PHYSICIAN DOSING INPATIENT
Freq: Every day | Status: DC
  Administered 2016-11-21 – 2016-11-22 (×2)

## 2016-11-19 MED ORDER — GENTAMICIN SULFATE 40 MG/ML IJ SOLN
100.0000 mg | Freq: Three times a day (TID) | INTRAVENOUS | Status: DC
  Administered 2016-11-19 – 2016-11-20 (×3): 100 mg via INTRAVENOUS
  Filled 2016-11-19 (×7): qty 100

## 2016-11-19 MED ORDER — PRAVASTATIN SODIUM 40 MG PO TABS
40.0000 mg | ORAL_TABLET | Freq: Every day | ORAL | Status: DC
  Administered 2016-11-20 – 2016-11-23 (×4): 40 mg via ORAL
  Filled 2016-11-19 (×4): qty 1

## 2016-11-19 MED ORDER — ENOXAPARIN SODIUM 40 MG/0.4ML ~~LOC~~ SOLN
40.0000 mg | Freq: Two times a day (BID) | SUBCUTANEOUS | Status: DC
  Administered 2016-11-19 – 2016-11-24 (×10): 40 mg via SUBCUTANEOUS
  Filled 2016-11-19 (×10): qty 0.4

## 2016-11-19 MED ORDER — CHLORHEXIDINE GLUCONATE 0.12 % MT SOLN: 15.0000 mL | Freq: Two times a day (BID) | OROMUCOSAL | Status: DC

## 2016-11-19 MED ORDER — AMIODARONE HCL 200 MG PO TABS
200.0000 mg | ORAL_TABLET | Freq: Every day | ORAL | Status: DC
  Administered 2016-11-20 – 2016-11-24 (×5): 200 mg via ORAL
  Filled 2016-11-19 (×6): qty 1

## 2016-11-19 MED ORDER — NAFCILLIN SODIUM 1 G IJ SOLR
3.0000 g | Freq: Four times a day (QID) | INTRAVENOUS | Status: DC
  Administered 2016-11-19 – 2016-11-24 (×19): 3 g via INTRAVENOUS
  Filled 2016-11-19 (×23): qty 3000

## 2016-11-19 NOTE — Progress Notes (Signed)
NP Hinton Dyer called to bedside, pt observed dyspneic. RR 40's, O2 sats at 88%. Per NP verbal order, BiPAP restarted. Patients BP 210/195 ( 202). Levo held, NP notified. Orders to keep strict NPO. Ativan orders given as well. Will continue to assess.

## 2016-11-19 NOTE — Consult Note (Signed)
[facility] Cardiology  CARDIOLOGY CONSULT NOTE  Patient ID: CORLISS LAMARTINA MRN: [id number] DOB/AGE: May 30, 1946 71 y.o.  Admit date: 11/18/2016 Referring Physician Ola Spurr Primary Physician [facility] Primary Cardiologist Nehemiah Massed Reason for Consultation endocarditis  HPI: 70 year old gentleman referred for evaluation for endocarditis.  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 The patient was recently hospitalized with respiratory failure and bacteremia, with native mitral valve vegetation with methicillin sensitive staph aureus treated with 6 week course of IV Ancef. PICC line was removed 11/16/2016. The patient experienced recurrent high-grade fever and confusion and was readmitted on 11/18/2016. Blood cultures are again positive for gram-positive cocci. 2-D echocardiogram was performed today which revealed normal left ventricular function, ossicle persistent vegetation on mitral valve, cannot rule out vegetation on aortic valve prosthetic valve. Admission labs notable for mildly elevated white count of 12,500.  Review of systems complete and found to be negative unless listed above     Past Medical History:  Diagnosis Date  . Chronic pain   . Coronary artery disease   . Diabetes mellitus without complication ([place])   . DJD (degenerative joint disease)   . GERD (gastroesophageal reflux disease)   . Hyperlipemia   . Hypertension   . Insomnia   . Neuropathy [facility])     Past Surgical History:  Procedure Laterality Date  . BACK SURGERY    . CARDIAC SURGERY    . JOINT REPLACEMENT    . TEE WITHOUT CARDIOVERSION N/A 10/08/2016   Procedure: TRANSESOPHAGEAL ECHOCARDIOGRAM (TEE);  Surgeon: Teodoro Spray, MD;  Location: [facility] ORS;  Service: Cardiovascular;  Laterality: N/A;    Prescriptions Prior to Admission  Medication Sig Dispense Refill Last Dose  . amiodarone (PACERONE) 200 MG tablet Take 1 tablet (200 mg total) by mouth daily. 30 tablet 1 11/18/2016  at Unknown time  . aspirin EC 81 MG tablet Take 1 tablet by mouth daily.   11/18/2016 at Unknown time  . Cyanocobalamin (RA VITAMIN B-12 TR) 1000 MCG TBCR Take 1 tablet by mouth daily.   11/18/2016 at Unknown time  . cyclobenzaprine (FLEXERIL) 10 MG tablet Take 1 tablet by mouth daily.   11/18/2016 at Unknown time  . DULoxetine (CYMBALTA) 60 MG capsule Take 1 capsule by mouth at bedtime.   11/17/2016 at Unknown time  . furosemide (LASIX) 40 MG tablet Take 1 tablet by mouth 2 (two) times daily.   11/18/2016 at Unknown time  . gabapentin (NEURONTIN) 600 MG tablet Take 1 tablet by mouth 3 (three) times daily.   11/18/2016 at Unknown time  . glimepiride (AMARYL) 2 MG tablet Take 2 mg by mouth daily.     Marland Kitchen ipratropium-albuterol (DUONEB) 0.5-2.5 (3) MG/3ML SOLN Take 3 mLs by nebulization every 6 (six) hours as needed. 360 mL 1 11/18/2016 at Unknown time  . KLOR-CON M20 20 MEQ tablet Take 1 tablet by mouth daily.     Marland Kitchen lisinopril (PRINIVIL,ZESTRIL) 10 MG tablet Take 1 tablet by mouth daily.   11/18/2016 at Unknown time  . Loratadine 10 MG CAPS Take 1 capsule by mouth daily.   11/18/2016 at Unknown time  . meclizine (ANTIVERT) 25 MG tablet Take 1 tablet by mouth 3 (three) times daily.   Unknown at Unknown  . metFORMIN (GLUCOPHAGE) 1000 MG tablet Take 1 tablet by mouth 2 (two) times daily. With meals.   11/18/2016 at Unknown time  . methadone (DOLOPHINE) 5 MG tablet Take 1 tablet by mouth 3 (three) times daily.   11/18/2016 at Unknown  time  . metoprolol tartrate (LOPRESSOR) 25 MG tablet Take 1 tablet by mouth 2 (two) times daily.   11/18/2016 at Unknown time  . Multiple Vitamin (MULTI-VITAMINS) TABS Take 1 tablet by mouth daily.   11/18/2016 at Unknown time  . omeprazole (PRILOSEC) 20 MG capsule Take 20 mg by mouth daily.   11/18/2016 at Unknown time  . Oxcarbazepine (TRILEPTAL) 300 MG tablet Take 1 tablet by mouth 2 (two) times daily.   11/18/2016 at Unknown time  . pravastatin (PRAVACHOL) 40 MG tablet Take 1 tablet by  mouth at bedtime.   11/18/2016 at Unknown time  . rivaroxaban (XARELTO) 20 MG TABS tablet Take 1 tablet (20 mg total) by mouth daily with supper. 30 tablet 1 11/18/2016 at Unknown time  . tamsulosin (FLOMAX) 0.4 MG CAPS capsule Take 1 capsule (0.4 mg total) by mouth daily. 30 capsule 0 11/18/2016 at Unknown time  . feeding supplement, ENSURE ENLIVE, (ENSURE ENLIVE) LIQD Take 237 mLs by mouth 2 (two) times daily between meals. 237 mL 12   . polyethylene glycol (MIRALAX / GLYCOLAX) packet Take 17 g by mouth daily. 14 each 0    Social History   Social History  . Marital status: Married    Spouse name: N/A  . Number of children: N/A  . Years of education: N/A   Occupational History  . Not on file.   Social History Main Topics  . Smoking status: Never Smoker  . Smokeless tobacco: Never Used  . Alcohol use No  . Drug use: Unknown  . Sexual activity: Not on file   Other Topics Concern  . Not on file   Social History Narrative  . No narrative on file    History reviewed. No pertinent family history.    Review of systems complete and found to be negative unless listed above      PHYSICAL EXAM  General: Well developed, well nourished, in no acute distress HEENT:  Normocephalic and atramatic Neck:  No JVD.  Lungs: Clear bilaterally to auscultation and percussion. Heart: HRRR . Normal S1 and S2 without gallops or murmurs.  Abdomen: Bowel sounds are positive, abdomen soft and non-tender  Msk:  Back normal, normal gait. Normal strength and tone for age. Extremities: No clubbing, cyanosis or edema.   Neuro: Alert and oriented X 3. Psych:  Good affect, responds appropriately  Labs:   Lab Results  Component Value Date   WBC 12.5 (H) 11/18/2016   HGB 11.8 (L) 11/18/2016   HCT 34.5 (L) 11/18/2016   MCV 83.5 11/18/2016   PLT 250 11/18/2016    Recent Labs Lab 11/18/16 2328  NA 135  K 4.3  CL 98*  CO2 27  BUN 17  CREATININE 0.91  CALCIUM 8.7*  PROT 8.5*  BILITOT 0.5   ALKPHOS 110  ALT 11*  AST 28  GLUCOSE 134*   Lab Results  Component Value Date   TROPONINI 0.06 ([place]) 10/02/2016   No results found for: CHOL No results found for: HDL No results found for: LDLCALC No results found for: TRIG No results found for: CHOLHDL No results found for: LDLDIRECT    Radiology: Dg Chest Port 1 View  Result Date: 11/19/2016 CLINICAL DATA:  Central line placement. EXAM: PORTABLE CHEST 1 VIEW COMPARISON:  Chest radiograph January 16, 2017 FINDINGS: LEFT internal jugular central venous catheter distal tip projects at proximal superior vena cava. The cardiac silhouette is similarly enlarged. Pulmonary vascular congestion without pleural effusion or focal consolidation. No pneumothorax. Soft  tissue planes and included osseous structure nonsuspicious. Status post median sternotomy for cardiac valve replacement. ACDF. IMPRESSION: LEFT IJ central venous catheter distal tip projects in proximal superior vena cava. No pneumothorax. Stable cardiomegaly and pulmonary vascular congestion. Electronically Signed   By: Elon Alas M.D.   On: 11/19/2016 05:26   Dg Chest Portable 1 View  Result Date: 11/19/2016 CLINICAL DATA:  Sepsis and fever.  Confusion. EXAM: PORTABLE CHEST 1 VIEW COMPARISON:  10/04/2016 FINDINGS: A single AP portable view of the chest demonstrates no focal airspace consolidation or alveolar edema. The lungs are grossly clear. There is no large effusion or pneumothorax. There is unchanged cardiomegaly. Cardiac and mediastinal contours are otherwise unremarkable. IMPRESSION: Mild cardiomegaly, unchanged.  No consolidation or effusion. Electronically Signed   By: Andreas Newport M.D.   On: 11/19/2016 00:19    EKG: Atrial flutter  ASSESSMENT AND PLAN:   1. 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 Paroxysmal atrial fibrillation/atrial flutter, on Xarelto for stroke prevention, and on amiodarone for rhythm  control  Recommendations  1. Continue nafcillin and rifampin and nafcillin per Dr. Ola Spurr 2. DC Xarelto 3. Start warfarin 5 mg daily, target INR 2.0-3.0 4. Continue amiodarone at current dose, may need to adjust pending patient's clinical response 5. Plan for TEE on Monday 2//2018 to rule out aortic bioprosthetic valve endocarditis  Signed: Isaias Cowman MD,PhD, [facility] 11/19/2016, 6:11 PM

## 2016-11-19 NOTE — Progress Notes (Signed)
*  PRELIMINARY RESULTS* Echocardiogram 2D Echocardiogram has been performed.  [name] 11/19/2016, 3:15 PM

## 2016-11-19 NOTE — Consult Note (Signed)
[facility] Infectious Disease     Reason for Consult:Bactermia, sepsis    Referring Physician: Merton Border Date of Admission:  11/18/2016   Active Problems:   Septic shock ([place])   HPI: ARI BERNABEI is a 71 y.o. 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  He was doing very well and I saw last week and he stopped ancef 1/29 and picc pulled 1/30.  However within 48 hours developed chills and fevers and confusion. On admit temp 103 and wbc elevated at 12.  BCX + MSSA again. He is actually clinically a little better since admit.   Past Medical History:  Diagnosis Date  . Chronic pain   . Coronary artery disease   . Diabetes mellitus without complication ([place])   . DJD (degenerative joint disease)   . GERD (gastroesophageal reflux disease)   . Hyperlipemia   . Hypertension   . Insomnia   . Neuropathy [facility])    Past Surgical History:  Procedure Laterality Date  . BACK SURGERY    . CARDIAC SURGERY    . JOINT REPLACEMENT    . TEE WITHOUT CARDIOVERSION N/A 10/08/2016   Procedure: TRANSESOPHAGEAL ECHOCARDIOGRAM (TEE);  Surgeon: Teodoro Spray, MD;  Location: [facility] ORS;  Service: Cardiovascular;  Laterality: N/A;   Social History  Substance Use Topics  . Smoking status: Never Smoker  . Smokeless tobacco: Never Used  . Alcohol use No   History reviewed. No pertinent family history.  Allergies:  Allergies  Allergen Reactions  . Rofecoxib Other (See Comments)    ulcers  . Tetracyclines & Related     Current antibiotics: Antibiotics Given (last 72 hours)    Date/Time Action Medication Dose Rate   11/19/16 0610 Given   piperacillin-tazobactam (ZOSYN) IVPB 3.375 g 3.375 g 12.5 mL/hr   11/19/16 1151 Given   ceFEPIme (MAXIPIME) 2 g in dextrose 5 % 50 mL IVPB 2 g 100 mL/hr      MEDICATIONS: . amiodarone  200 mg Oral Daily  . aspirin EC  81 mg Oral Daily   . ceFEPime (MAXIPIME) IV  2 g Intravenous Q8H  . chlorhexidine  15 mL Mouth Rinse BID  . DULoxetine  60 mg Oral QHS  . gabapentin  600 mg Oral TID  . insulin aspart  2-6 Units Subcutaneous Q4H  . mouth rinse  15 mL Mouth Rinse q12n4p  . pantoprazole  40 mg Oral QAC breakfast  . pravastatin  40 mg Oral QHS  . rivaroxaban  20 mg Oral Q supper  . tamsulosin  0.4 mg Oral Daily    Review of Systems - 11 systems reviewed and negative per HPI   OBJECTIVE: Temp:  [100 F (37.8 C)-103.2 F (39.6 C)] 101.2 F (38.4 C) (02/01 0800) Pulse Rate:  [38-130] 102 (02/01 1130) Resp:  [21-42] 42 (02/01 1130) BP: (70-169)/(44-106) 143/100 (02/01 1130) SpO2:  [86 %-100 %] 86 % (02/01 1130) FiO2 (%):  [40 %] 40 % (02/01 1100) Weight:  [126.5 kg (278 lb 14.1 oz)-127.1 kg (280 lb 3.3 oz)] 127.1 kg (280 lb 3.3 oz) (02/01 0350) Physical Exam  Constitutional: on BiPap, awake, interactive HENT: anicteric Mouth/Throat: Oropharynx is clear and moist. No oropharyngeal exudate.  Cardiovascular: Normal rate, regular rhythm and normal heart sounds.1/6 sm LLSB  Pulmonary/Chest: Effort normal and breath sounds normal. No respiratory distress. He has no wheezes.  Abdominal:  Soft. Bowel sounds are normal. He exhibits no distension. There is no tenderness.  Lymphadenopathy: He has no cervical adenopathy.  Neurological: He is alert and oriented to person, place, and time.  Skin: Skin is warm and dry. No rash noted. No erythema.  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 His behavior is normal.     LABS: Results for orders placed or performed during the hospital encounter of 11/18/16 (from the past 48 hour(s))  Comprehensive metabolic panel     Status: Abnormal   Collection Time: 11/18/16 11:28 PM  Result Value Ref Range   Sodium 135 135 - 145 mmol/L   Potassium 4.3 3.5 - 5.1 mmol/L   Chloride 98 (L)  101 - 111 mmol/L   CO2 27 22 - 32 mmol/L   Glucose, Bld 134 (H) 65 - 99 mg/dL   BUN 17 6 - 20 mg/dL   Creatinine, Ser 0.91 0.61 - 1.24 mg/dL   Calcium 8.7 (L) 8.9 - 10.3 mg/dL   Total Protein 8.5 (H) 6.5 - 8.1 g/dL   Albumin 3.9 3.5 - 5.0 g/dL   AST 28 15 - 41 U/L   ALT 11 (L) 17 - 63 U/L   Alkaline Phosphatase 110 38 - 126 U/L   Total Bilirubin 0.5 0.3 - 1.2 mg/dL   GFR calc non Af Amer >60 >60 mL/min   GFR calc Af Amer >60 >60 mL/min    Comment: (NOTE) The eGFR has been calculated using the CKD EPI equation. This calculation has not been validated in all clinical situations. eGFR's persistently <60 mL/min signify possible Chronic Kidney Disease.    Anion gap 10 5 - 15  Lactic acid, plasma     Status: Abnormal   Collection Time: 11/18/16 11:28 PM  Result Value Ref Range   Lactic Acid, Venous 2.4 (HH) 0.5 - 1.9 mmol/L    Comment: CRITICAL RESULT CALLED TO, READ BACK BY AND VERIFIED WITH CHRISTINE MARTIN ON 11/19/16 AT 0052 BY TLB   CBC with Differential     Status: Abnormal   Collection Time: 11/18/16 11:28 PM  Result Value Ref Range   WBC 12.5 (H) 3.8 - 10.6 K/uL   RBC 4.14 (L) 4.40 - 5.90 MIL/uL   Hemoglobin 11.8 (L) 13.0 - 18.0 g/dL   HCT 34.5 (L) 40.0 - 52.0 %   MCV 83.5 80.0 - 100.0 fL   MCH 28.6 26.0 - 34.0 pg   MCHC 34.3 32.0 - 36.0 g/dL   RDW 15.9 (H) 11.5 - 14.5 %   Platelets 250 150 - 440 K/uL   Neutrophils Relative % 87 %   Neutro Abs 10.9 (H) 1.4 - 6.5 K/uL   Lymphocytes Relative 4 %   Lymphs Abs 0.5 (L) 1.0 - 3.6 K/uL   Monocytes Relative 6 %   Monocytes Absolute 0.7 0.2 - 1.0 K/uL   Eosinophils Relative 2 %   Eosinophils Absolute 0.2 0 - 0.7 K/uL   Basophils Relative 1 %   Basophils Absolute 0.2 (H) 0 - 0.1 K/uL  Protime-INR     Status: Abnormal   Collection Time: 11/18/16 11:28 PM  Result Value Ref Range   Prothrombin Time 18.0 (H) 11.4 - 15.2 seconds   INR 1.47   Culture, blood (Routine x 2)     Status: None (Preliminary result)   Collection Time:  11/18/16 11:28 PM  Result Value Ref Range  Specimen Description BLOOD LEFT ANTECUBITAL    Special Requests      BOTTLES DRAWN AEROBIC AND ANAEROBIC  AER [place] ANA 4CC   Culture NO GROWTH < 12 HOURS    Report Status PENDING   Culture, blood (Routine x 2)     Status: None (Preliminary result)   Collection Time: 11/18/16 11:28 PM  Result Value Ref Range   Specimen Description BLOOD LEFT HAND    Special Requests      BOTTLES DRAWN AEROBIC AND ANAEROBIC  AER 2CC ANA [place]   Culture  Setup Time Organism ID to follow    Culture NO GROWTH < 12 HOURS    Report Status PENDING   Urinalysis, Complete w Microscopic     Status: Abnormal   Collection Time: 11/18/16 11:28 PM  Result Value Ref Range   Color, Urine YELLOW (A) YELLOW   APPearance CLEAR (A) CLEAR   Specific Gravity, Urine 1.011 1.005 - 1.030   pH 6.0 5.0 - 8.0   Glucose, UA NEGATIVE NEGATIVE mg/dL   Hgb urine dipstick NEGATIVE NEGATIVE   Bilirubin Urine NEGATIVE NEGATIVE   Ketones, ur NEGATIVE NEGATIVE mg/dL   Protein, ur NEGATIVE NEGATIVE mg/dL   Nitrite NEGATIVE NEGATIVE   Leukocytes, UA NEGATIVE NEGATIVE   RBC / HPF 0-5 0 - 5 RBC/hpf   WBC, UA 0-5 0 - 5 WBC/hpf   Bacteria, UA NONE SEEN NONE SEEN   Squamous Epithelial / LPF 0-5 (A) NONE SEEN   Hyaline Casts, UA PRESENT   Influenza panel by PCR (type A & B)     Status: None   Collection Time: 11/18/16 11:28 PM  Result Value Ref Range   Influenza A By PCR NEGATIVE NEGATIVE   Influenza B By PCR NEGATIVE NEGATIVE    Comment: (NOTE) The Xpert Xpress Flu assay is intended as an aid in the diagnosis of  influenza and should not be used as a sole basis for treatment.  This  assay is FDA approved for nasopharyngeal swab specimens only. Nasal  washings and aspirates are unacceptable for Xpert Xpress Flu testing.   Lactic acid, plasma     Status: Abnormal   Collection Time: 11/19/16  2:16 AM  Result Value Ref Range   Lactic Acid, Venous 3.0 (HH) 0.5 - 1.9 mmol/L    Comment:  CRITICAL RESULT CALLED TO, READ BACK BY AND VERIFIED WITH CHRISTINE MARTIN @ 0253 ON 11/19/2016 BY CAF   Procalcitonin - Baseline     Status: None   Collection Time: 11/19/16  2:16 AM  Result Value Ref Range   Procalcitonin 3.84 ng/mL    Comment:        Interpretation: PCT > 2 ng/mL: Systemic infection (sepsis) is likely, unless other causes are known. (NOTE)         ICU PCT Algorithm               Non ICU PCT Algorithm    ----------------------------     ------------------------------         PCT < 0.25 ng/mL                 PCT < 0.1 ng/mL     Stopping of antibiotics            Stopping of antibiotics       strongly encouraged.               strongly encouraged.    ----------------------------     ------------------------------  PCT level decrease by               PCT < 0.25 ng/mL       >= 80% from peak PCT       OR PCT 0.25 - 0.5 ng/mL          Stopping of antibiotics                                             encouraged.     Stopping of antibiotics           encouraged.    ----------------------------     ------------------------------       PCT level decrease by              PCT >= 0.25 ng/mL       < 80% from peak PCT        AND PCT >= 0.5 ng/mL            Continuing antibiotics                                               encouraged.       Continuing antibiotics            encouraged.    ----------------------------     ------------------------------     PCT level increase compared          PCT > 0.5 ng/mL         with peak PCT AND          PCT >= 0.5 ng/mL             Escalation of antibiotics                                          strongly encouraged.      Escalation of antibiotics        strongly encouraged.   Glucose, capillary     Status: Abnormal   Collection Time: 11/19/16  3:44 AM  Result Value Ref Range   Glucose-Capillary 130 (H) 65 - 99 mg/dL  MRSA PCR Screening     Status: None   Collection Time: 11/19/16  3:50 AM  Result Value Ref Range   MRSA  by PCR NEGATIVE NEGATIVE    Comment:        The GeneXpert MRSA Assay (FDA approved for NASAL specimens only), is one component of a comprehensive MRSA colonization surveillance program. It is not intended to diagnose MRSA infection nor to guide or monitor treatment for MRSA infections.   Glucose, capillary     Status: Abnormal   Collection Time: 11/19/16  5:54 AM  Result Value Ref Range   Glucose-Capillary 117 (H) 65 - 99 mg/dL  Glucose, capillary     Status: Abnormal   Collection Time: 11/19/16  7:33 AM  Result Value Ref Range   Glucose-Capillary 123 (H) 65 - 99 mg/dL  Glucose, capillary     Status: Abnormal   Collection Time: 11/19/16 11:03 AM  Result Value Ref Range   Glucose-Capillary 124 (H) 65 - 99 mg/dL   No components found for: ESR,  C REACTIVE PROTEIN MICRO: Recent Results (from the past 720 hour(s))  Culture, blood (Routine x 2)     Status: None (Preliminary result)   Collection Time: 11/18/16 11:28 PM  Result Value Ref Range Status   Specimen Description BLOOD LEFT ANTECUBITAL  Final   Special Requests   Final    BOTTLES DRAWN AEROBIC AND ANAEROBIC  AER [place] ANA 4CC   Culture NO GROWTH < 12 HOURS  Final   Report Status PENDING  Incomplete  Culture, blood (Routine x 2)     Status: None (Preliminary result)   Collection Time: 11/18/16 11:28 PM  Result Value Ref Range Status   Specimen Description BLOOD LEFT HAND  Final   Special Requests   Final    BOTTLES DRAWN AEROBIC AND ANAEROBIC  AER 2CC ANA [place]   Culture  Setup Time Organism ID to follow  Final   Culture NO GROWTH < 12 HOURS  Final   Report Status PENDING  Incomplete  MRSA PCR Screening     Status: None   Collection Time: 11/19/16  3:50 AM  Result Value Ref Range Status   MRSA by PCR NEGATIVE NEGATIVE Final    Comment:        The GeneXpert MRSA Assay (FDA approved for NASAL specimens only), is one component of a comprehensive MRSA colonization surveillance program. It is not intended to diagnose  MRSA infection nor to guide or monitor treatment for MRSA infections.     IMAGING: Dg Chest Port 1 View  Result Date: 11/19/2016 CLINICAL DATA:  Central line placement. EXAM: PORTABLE CHEST 1 VIEW COMPARISON:  Chest radiograph January 16, 2017 FINDINGS: LEFT internal jugular central venous catheter distal tip projects at proximal superior vena cava. The cardiac silhouette is similarly enlarged. Pulmonary vascular congestion without pleural effusion or focal consolidation. No pneumothorax. Soft tissue planes and included osseous structure nonsuspicious. Status post median sternotomy for cardiac valve replacement. ACDF. IMPRESSION: LEFT IJ central venous catheter distal tip projects in proximal superior vena cava. No pneumothorax. Stable cardiomegaly and pulmonary vascular congestion. Electronically Signed   By: Elon Alas M.D.   On: 11/19/2016 05:26   Dg Chest Portable 1 View  Result Date: 11/19/2016 CLINICAL DATA:  Sepsis and fever.  Confusion. EXAM: PORTABLE CHEST 1 VIEW COMPARISON:  10/04/2016 FINDINGS: A single AP portable view of the chest demonstrates no focal airspace consolidation or alveolar edema. The lungs are grossly clear. There is no large effusion or pneumothorax. There is unchanged cardiomegaly. Cardiac and mediastinal contours are otherwise unremarkable. IMPRESSION: Mild cardiomegaly, unchanged.  No consolidation or effusion. Electronically Signed   By: Andreas Newport M.D.   On: 11/19/2016 00:19   TEE 12/21/ 17  Study Conclusions  - Left ventricle: Systolic function was normal. The estimated   ejection fraction was in the range of 55% to 65%. - Aortic valve: A prosthesis was present and functioning normally.   The prosthesis had a normal range of motion. The sewing ring   appeared normal, had no rocking motion, and showed no evidence of   dehiscence. There was trivial regurgitation. - Mitral valve: Mildly calcified annulus. Mildly thickened leaflets   . There was a  vegetation. There was mild regurgitation. - Left atrium: The atrium was dilated. No evidence of thrombus in   the atrial cavity or appendage. - Right atrium: No evidence of thrombus in the atrial cavity or   appendage. - Atrial septum: No defect or patent foramen ovale was identified.   Echo contrast  study showed no right-to-left atrial level shunt,   following an increase in RA pressure induced by provocative   maneuvers. - Tricuspid valve: No evidence of vegetation. Assessment:   FARLEY CROOKER is a 71 y.o. 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  He was doing very well and I saw last week and he stopped ancef 1/29 and picc pulled 1/30.  However within 48 hours developed chills and fevers and confusion. On admit temp 103 and wbc elevated at 12.  BCX + MSSA again. 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  The knee however looks very good and he has no pain in it. He also has no other evidence of sites of residual infection so I do suspect it is the prosthetic valve.   Recommendations Change to nafcillin Add gent and rifampin Repeat bcx Check ECHO - will likely need TEE WIll need to change off xarelto given interaction with rifampin - will see what cards recommends Discussed with Pt, wife and staff. Thank you very much for allowing me to participate in the care of this patient. Please call with questions.   Cheral Marker. Ola Spurr, MD

## 2016-11-19 NOTE — Plan of Care (Signed)
Problem: Education: Goal: Knowledge of [facility] Education information/materials will improve Outcome: Progressing Pt and family oriented to the unit. Had questions answered discussed pain management and reviewed current plan of care.  Problem: Safety: Goal: Ability to remain free from injury will improve Outcome: Progressing Pt remained free from injury on my shift.

## 2016-11-19 NOTE — ED Notes (Signed)
Pt placed on Bipap

## 2016-11-19 NOTE — H&P (Signed)
PULMONARY / CRITICAL CARE MEDICINE   Name: [name] MRN: [id number] DOB: 04/13/1946    ADMISSION DATE:  11/18/2016 CONSULTATION DATE: 11/19/16  REFERRING MD:  Dr. Owens Shark  CHIEF COMPLAINT:  Confusion, fever and tachycardia  HISTORY OF PRESENT ILLNESS:   [name] is 71 year old with PMH significant for chronic pain, CAD, DM, DJD, GERD, HLD and HTN.  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  His wife states that patient received 38 days of cefazolin through his PICC line and the PICC line was removed on 11/16/16.on 1/31 patient was noted to have new onset of fever(103 f), confusion and incontinence. EMS was called and brought the patient to [facility] ON 1/31.  He was noted to be hypoxic and was placed on BiPAP.  PCCM team was called to admit the patient. PAST MEDICAL HISTORY :  He  has a past medical history of Chronic pain; Coronary artery disease; Diabetes mellitus without complication ([place]); DJD (degenerative joint disease); GERD (gastroesophageal reflux disease); Hyperlipemia; Hypertension; Insomnia; and Neuropathy ([place]).  PAST SURGICAL HISTORY: He  has a past surgical history that includes Cardiac surgery; Joint replacement; Back surgery; and TEE without cardioversion (N/A, 10/08/2016).  Allergies  Allergen Reactions  . Rofecoxib Other (See Comments)    ulcers  . Tetracyclines & Related     No current facility-administered medications on file prior to encounter.    Current Outpatient Prescriptions on File Prior to Encounter  Medication Sig  . amiodarone (PACERONE) 200 MG tablet Take 1 tablet (200 mg total) by mouth daily.  Marland Kitchen aspirin EC 81 MG tablet Take 1 tablet by mouth daily.  . Cyanocobalamin (RA VITAMIN B-12 TR) 1000 MCG TBCR Take 1 tablet by mouth daily.  . cyclobenzaprine (FLEXERIL) 10 MG  tablet Take 1 tablet by mouth daily.  . DULoxetine (CYMBALTA) 60 MG capsule Take 1 capsule by mouth at bedtime.  . furosemide (LASIX) 40 MG tablet Take 1 tablet by mouth 2 (two) times daily.  Marland Kitchen gabapentin (NEURONTIN) 600 MG tablet Take 1 tablet by mouth 3 (three) times daily.  Marland Kitchen ipratropium-albuterol (DUONEB) 0.5-2.5 (3) MG/3ML SOLN Take 3 mLs by nebulization every 6 (six) hours as needed.  Marland Kitchen lisinopril (PRINIVIL,ZESTRIL) 10 MG tablet Take 1 tablet by mouth daily.  . Loratadine 10 MG CAPS Take 1 capsule by mouth daily.  . meclizine (ANTIVERT) 25 MG tablet Take 1 tablet by mouth 3 (three) times daily.  . metFORMIN (GLUCOPHAGE) 1000 MG tablet Take 1 tablet by mouth 2 (two) times daily. With meals.  . methadone (DOLOPHINE) 5 MG tablet Take 1 tablet by mouth 3 (three) times daily.  . metoprolol tartrate (LOPRESSOR) 25 MG tablet Take 1 tablet by mouth 2 (two) times daily.  . Multiple Vitamin (MULTI-VITAMINS) TABS Take 1 tablet by mouth daily.  Marland Kitchen omeprazole (PRILOSEC) 20 MG capsule Take 20 mg by mouth daily.  . Oxcarbazepine (TRILEPTAL) 300 MG tablet Take 1 tablet by mouth 2 (two) times daily.  . pravastatin (PRAVACHOL) 40 MG tablet Take 1 tablet by mouth at bedtime.  . rivaroxaban (XARELTO) 20 MG TABS tablet Take 1 tablet (20 mg total) by mouth daily with supper.  . tamsulosin (FLOMAX) 0.4 MG CAPS capsule Take 1 capsule (0.4 mg total) by mouth daily.  . feeding supplement, ENSURE ENLIVE, (ENSURE ENLIVE) LIQD Take 237 mLs by mouth 2 (two) times daily between meals.  Marland Kitchen  polyethylene glycol (MIRALAX / GLYCOLAX) packet Take 17 g by mouth daily.    FAMILY HISTORY:  His has no family status information on file.    SOCIAL HISTORY: He  reports that he has never smoked. He has never used smokeless tobacco. He reports that he does not drink alcohol.  REVIEW OF SYSTEMS:   Unable to obtain as the patient is on BiPAP  SUBJECTIVE:  Unable to obtain as the patient is on BiPAP  VITAL SIGNS: BP 115/75  (BP Location: Right Arm)   Pulse (!) 114   Temp 100 F (37.8 C) (Axillary)   Resp (!) 24   Ht '5\' 11"'$  (1.803 m)   Wt 126.5 kg (278 lb 14.1 oz)   SpO2 97%   BMI 38.90 kg/m   HEMODYNAMICS:    VENTILATOR SETTINGS: FiO2 (%):  [40 %] 40 %  INTAKE / OUTPUT: No intake/output data recorded.  PHYSICAL EXAMINATION: General:  Elderly caucasian Gentleman, in some distress Neuro:  Oriented to self HEENT:  AT, [place], No jvd,PERRLA Cardiovascular:Tachycardic,S1S2, No murmur noted Lungs:diminished bibasilar, no wheezes, crackles, rhonchi noted Abdomen:  Obese, soft, active bowel sounds Musculoskeletal:  No cyanosis, edema noted Skin:warm dry intact  LABS:  BMET  Recent Labs Lab 11/18/16 2328  NA 135  K 4.3  CL 98*  CO2 27  BUN 17  CREATININE 0.91  GLUCOSE 134*    Electrolytes  Recent Labs Lab 11/18/16 2328  CALCIUM 8.7*    CBC  Recent Labs Lab 11/18/16 2328  WBC 12.5*  HGB 11.8*  HCT 34.5*  PLT 250    Coag's  Recent Labs Lab 11/18/16 2328  INR 1.47    Sepsis Markers  Recent Labs Lab 11/18/16 2328  LATICACIDVEN 2.4*    ABG No results for input(s): PHART, PCO2ART, PO2ART in the last 168 hours.  Liver Enzymes  Recent Labs Lab 11/18/16 2328  AST 28  ALT 11*  ALKPHOS 110  BILITOT 0.5  ALBUMIN 3.9    Cardiac Enzymes No results for input(s): TROPONINI, PROBNP in the last 168 hours.  Glucose No results for input(s): GLUCAP in the last 168 hours.  Imaging Dg Chest Portable 1 View  Result Date: 11/19/2016 CLINICAL DATA:  Sepsis and fever.  Confusion. EXAM: PORTABLE CHEST 1 VIEW COMPARISON:  10/04/2016 FINDINGS: A single AP portable view of the chest demonstrates no focal airspace consolidation or alveolar edema. The lungs are grossly clear. There is no large effusion or pneumothorax. There is unchanged cardiomegaly. Cardiac and mediastinal contours are otherwise unremarkable. IMPRESSION: Mild cardiomegaly, unchanged.  No consolidation or  effusion. Electronically Signed   By: Andreas Newport M.D.   On: 11/19/2016 00:19     STUDIES:  10/03/16 echo>>The cavity size was normal. Wall thickness was  increased in a pattern of mild LVH. Systolic function was normal.  The estimated ejection fraction was in the range of 60% to 65%.  CULTURES: 1/31 BC>> 1/31 UC>>  ANTIBIOTICS: 11/18/16 Vancomycin>> 11/18/16 Zosyn>>  SIGNIFICANT EVENTS: 2/1>> Patient admitted to ICU with sepsis . Just finished  I/v antibiotics treatment on 1/29  LINES/TUBES: none   ASSESSMENT / PLAN:  PULMONARY A: Acute Respiratory Failure secondary to sepsis P:   Continue Bipap Bronchodilator prn Vanc/Zosyn  CARDIOVASCULAR A:  Septic shock CAF Hx of CAD, HLD, HTN, CHF P:  Continuous Telemetry Neo synephrine gtt Keep MAP GOALS>65 Continue aspirin, pravastatin, rivaroxaban, amiodarone Will hold lasix , metoprolol and lisinopril now  RENAL A:   No active issues P:  Replace electrolytes per usual guidelines Follow chemistry intermittently  GASTROINTESTINAL A:    Hx of GERD P:   Npo for now Continue Protonix  HEMATOLOGIC A:   No active issues P:  On anticoagulation  INFECTIOUS A:   Hx of MSSA Endocarditis- Finished I/V cefazolin on 1/29 Septic shock Pyrexia Lactic acidosis P:   Monitor fever curve Follow cultures Continue Broad spectrum antibiotics Follow CBC Follow lactic acid, procalcitonin ID consulted PRN tylenol  ENDOCRINE A:   Hx of DM P:   Blood glucose checks q4 hours with SSI coverage  NEUROLOGIC A:   Hx of Neuropathy P:   Continue home dose gabapentin and cymbalta   Bincy Varughese,AG-ACNP Pulmonary and [place]  11/19/2016, 2:07 AM    PCCM ATTENDING ATTESTATION: I have evaluated patient with the APP Varughese, reviewed database in its entirety and discussed care plan in detail. In addition, this patient was discussed on multidisciplinary rounds.    Important exam findings: No distress this AM on [place] O2 Cognition intact though slightly sluggish in responses HEENT WNL No JVD seen No wheezes IRIR, no M noted Abd soft No LE edema  Major problems addressed by PCCM team: Severe sepsis, septic shock Recent mitral valve MSSA endocarditis Chronic AF, rate adequately controlled   PLAN/REC: As above. Source of sepsis is not clear. Could be recurrent endocarditis but this would seem and abrupt and profound relapse just one day after stopping antibiotics. Will ask Dr Ola Spurr to see (he has seen previously)   Merton Border, MD PCCM service Mobile [phone] Pager [phone] 11/19/2016

## 2016-11-19 NOTE — Progress Notes (Signed)
Pharmacy Antibiotic Note  [name] is a 71 y.o. male admitted on 11/18/2016 with bacteremia and endocarditis.  Pharmacy has been consulted for cefepime dosing. Patient was on cefazolin outpatient with PICC line placement for MSSA endocarditis. Recently PICC line taken out on 1/29.   Plan: Dr. Ola Spurr recommended starting nafcillin, gentamicin, and rifampin in this patient.   Initiated:  Nafcillin 3g IV Q6h   Rifampin '300mg'$  PO Q8h  Gentamicin '100mg'$  IV Q8h.   Ordered Gentamicin peak and trough. Peak on 2/2 @ 1000 and trough on 2/2 @ 0830. Goal peak 3-4 and trough <1.   Drug-drug interactions with rifampin: discussed with cardiologist  - amiodarone and rifampin: decrease efficacy of amiodarone   - oral anticoagulants:    Xarelto, Eliquis, Dabigatran- category X   Warfarin- category C  Height: '5\' 10"'$  (177.8 cm) Weight: 280 lb 3.3 oz (127.1 kg) IBW/kg (Calculated) : 73  Temp (24hrs), Avg:101.4 F (38.6 C), Min:100 F (37.8 C), Max:103.2 F (39.6 C)   Recent Labs Lab 11/18/16 2328 11/19/16 0216  WBC 12.5*  --   CREATININE 0.91  --   LATICACIDVEN 2.4* 3.0*    Estimated Creatinine Clearance: 101.1 mL/min (by C-G formula based on SCr of 0.91 mg/dL).    Allergies  Allergen Reactions  . Rofecoxib Other (See Comments)    ulcers  . Tetracyclines & Related     Antimicrobials this admission: Vanc 1/31 >> 1/31 Zosyn 1/31 >> 1/31 Cefepime 2/1 >>2/1   Microbiology results: 2/1 BCx: Staph species; mec A not detected  2/1 UCx: sent  2/1 MRSA PCR: neg   Thank you for allowing pharmacy to be a part of this patient's care.  Loree Fee, PharmD 11/19/2016 4:15 PM

## 2016-11-19 NOTE — Progress Notes (Signed)
Pharmacy Antibiotic Note  [name] is a 71 y.o. male admitted on 11/18/2016 with bacteremia and endocarditis.  Pharmacy has been consulted for cefepime dosing. Patient was on cefazolin outpatient with PICC line placement for MSSA endocarditis. Recently PICC line taken out on 1/29.   Plan: Vancomycin and Zosyn x1 given in the ED. Initiated cefepime 2g IV Q8h.   Height: '5\' 10"'$  (177.8 cm) Weight: 280 lb 3.3 oz (127.1 kg) IBW/kg (Calculated) : 73  Temp (24hrs), Avg:101.3 F (38.5 C), Min:100 F (37.8 C), Max:103.2 F (39.6 C)   Recent Labs Lab 11/18/16 2328 11/19/16 0216  WBC 12.5*  --   CREATININE 0.91  --   LATICACIDVEN 2.4* 3.0*    Estimated Creatinine Clearance: 101.1 mL/min (by C-G formula based on SCr of 0.91 mg/dL).    Allergies  Allergen Reactions  . Rofecoxib Other (See Comments)    ulcers  . Tetracyclines & Related     Antimicrobials this admission: Vanc 1/31 >> 1/31 Zosyn 1/31 >> 1/31 Cefepime 2/1 >>   Microbiology results: 2/1 BCx: Staph species; mec A not detected  2/1 UCx: sent  2/1 MRSA PCR: neg   Thank you for allowing pharmacy to be a part of this patient's care.  Loree Fee, PharmD 11/19/2016 1:15 PM

## 2016-11-19 NOTE — Progress Notes (Signed)
PHARMACY - PHYSICIAN COMMUNICATION CRITICAL VALUE ALERT - BLOOD CULTURE IDENTIFICATION (BCID)  Results for orders placed or performed during the hospital encounter of 11/18/16  Blood Culture ID Panel (Reflexed) (Collected: 11/18/2016 11:28 PM)  Result Value Ref Range   Enterococcus species NOT DETECTED NOT DETECTED   Listeria monocytogenes NOT DETECTED NOT DETECTED   Staphylococcus species DETECTED (A) NOT DETECTED   Staphylococcus aureus DETECTED (A) NOT DETECTED   Methicillin resistance NOT DETECTED NOT DETECTED   Streptococcus species NOT DETECTED NOT DETECTED   Streptococcus agalactiae NOT DETECTED NOT DETECTED   Streptococcus pneumoniae NOT DETECTED NOT DETECTED   Streptococcus pyogenes NOT DETECTED NOT DETECTED   Acinetobacter baumannii NOT DETECTED NOT DETECTED   Enterobacteriaceae species NOT DETECTED NOT DETECTED   Enterobacter cloacae complex NOT DETECTED NOT DETECTED   Escherichia coli NOT DETECTED NOT DETECTED   Klebsiella oxytoca NOT DETECTED NOT DETECTED   Klebsiella pneumoniae NOT DETECTED NOT DETECTED   Proteus species NOT DETECTED NOT DETECTED   Serratia marcescens NOT DETECTED NOT DETECTED   Haemophilus influenzae NOT DETECTED NOT DETECTED   Neisseria meningitidis NOT DETECTED NOT DETECTED   Pseudomonas aeruginosa NOT DETECTED NOT DETECTED   Candida albicans NOT DETECTED NOT DETECTED   Candida glabrata NOT DETECTED NOT DETECTED   Candida krusei NOT DETECTED NOT DETECTED   Candida parapsilosis NOT DETECTED NOT DETECTED   Candida tropicalis NOT DETECTED NOT DETECTED    Name of physician (or Provider) Contacted: Handed off to ICU pharmacist   Changes to prescribed antibiotics required: Patient failed outpatient therapy on cefazolin. Being treated inpatient with cefepime. ID consulted.   Pernell Dupre, PharmD, BCPS Clinical Pharmacist 11/19/2016 12:13 PM

## 2016-11-19 NOTE — Progress Notes (Signed)
Dr.Simmonds at bedside.Bipap stopped. Pt currently on 4L [place].

## 2016-11-19 NOTE — Procedures (Signed)
  Central Venous Catheter Insertion Procedure Note- Left Internal Jugular LELA GELL [id number] 1946-09-06  Procedure: Insertion of Central Venous Catheter Indications: Assessment of intravascular volume, Drug and/or fluid administration and Frequent blood sampling  Procedure Details Consent: Risks of procedure as well as the alternatives and risks of each were explained to the (patient/caregiver).  Consent for procedure obtained. Time Out: Verified patient identification, verified procedure, site/side was marked, verified correct patient position, special equipment/implants available, medications/allergies/relevent history reviewed, required imaging and test results available.  Performed  Maximum sterile technique was used including antiseptics, cap, gloves, gown, hand hygiene, mask and sheet. Skin prep: Chlorhexidine; local anesthetic administered A antimicrobial bonded/coated triple lumen catheter was placed in the left internal jugular vein using the Seldinger technique.  Evaluation Blood flow good Complications: No apparent complications Patient did tolerate procedure well. Chest X-ray ordered to verify placement.  CXR: pending.  Procedure performed under direct ultrasound guidance for real time vessel cannulation.       [place], MD PCCM service Mobile [phone] Pager [phone] 11/19/2016

## 2016-11-19 NOTE — Progress Notes (Signed)
PULMONARY / CRITICAL CARE MEDICINE   Name: [name] MRN: [id number] DOB: Apr 21, 1946    ADMISSION DATE:  11/18/2016 CONSULTATION DATE: 11/19/16  REFERRING MD:  Dr. Owens Shark  CHIEF COMPLAINT:  Confusion, fever and tachycardia  HISTORY OF PRESENT ILLNESS:   [name] is 71 year old with PMH significant for chronic pain, CAD, DM, DJD, GERD, HLD and HTN.  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  His wife states that patient received 38 days of cefazolin through his PICC line and the PICC line was removed on 11/16/16.on 1/31 patient was noted to have new onset of fever(103 f), confusion and incontinence. EMS was called and brought the patient to [facility] ON 1/31.  He was noted to be hypoxic and was placed on BiPAP.  PCCM team was called to admit the patient.  REVIEW OF SYSTEMS:   Unable to assess at this time pt having severe respiratory distress  SUBJECTIVE:  Pt currently in severe respiratory distress on nasal canula, he is tachypneic with respiratory rate 40's, tachycardic hr 120, and hypertensive bp 210/195.  Therefore, placed pt back on continuous Bipap.  VITAL SIGNS: BP (!) 143/100   Pulse (!) 102   Temp (!) 101.2 F (38.4 C) (Axillary)   Resp (!) 42   Ht '5\' 10"'$  (1.778 m)   Wt 127.1 kg (280 lb 3.3 oz)   SpO2 (!) 86%   BMI 40.21 kg/m   HEMODYNAMICS:    VENTILATOR SETTINGS: FiO2 (%):  [40 %] 40 %  INTAKE / OUTPUT: I/O last 3 completed shifts: In: 226.1 [I.V.:226.1] Out: -   PHYSICAL EXAMINATION: General:  elderly caucasian gentleman, in acute respiratory distress Neuro: alert and oriented, follows commands, PERRLA  HEENT: large neck, mild JVD Cardiovascular: tachycardic,s1s2, No murmur noted Lungs: mild rhonchi throughout, tachypneic, labored use of accessory muscles to breath  Abdomen:   Obese, soft, active bowel sounds x4, non tender, non distended  Musculoskeletal: bilateral lower extremities cool, moves all extremities, no edema  Skin: bilateral great toe healing abrasion, small healing left heal abrasion, chronic venous stasis changes of bilateral lower extremities  LABS:  BMET  Recent Labs Lab 11/18/16 2328  NA 135  K 4.3  CL 98*  CO2 27  BUN 17  CREATININE 0.91  GLUCOSE 134*    Electrolytes  Recent Labs Lab 11/18/16 2328  CALCIUM 8.7*    CBC  Recent Labs Lab 11/18/16 2328  WBC 12.5*  HGB 11.8*  HCT 34.5*  PLT 250    Coag's  Recent Labs Lab 11/18/16 2328  INR 1.47    Sepsis Markers  Recent Labs Lab 11/18/16 2328 11/19/16 0216  LATICACIDVEN 2.4* 3.0*  PROCALCITON  --  3.84    ABG No results for input(s): PHART, PCO2ART, PO2ART in the last 168 hours.  Liver Enzymes  Recent Labs Lab 11/18/16 2328  AST 28  ALT 11*  ALKPHOS 110  BILITOT 0.5  ALBUMIN 3.9    Cardiac Enzymes No results for input(s): TROPONINI, PROBNP in the last 168 hours.  Glucose  Recent Labs Lab 11/19/16 0344 11/19/16 0554 11/19/16 0733 11/19/16 1103  GLUCAP 130* 117* 123* 124*    Imaging Dg Chest Port 1 View  Result Date: 11/19/2016 CLINICAL DATA:  Central line placement. EXAM: PORTABLE CHEST 1 VIEW COMPARISON:  Chest radiograph January 16, 2017 FINDINGS: LEFT internal jugular central venous catheter distal  tip projects at proximal superior vena cava. The cardiac silhouette is similarly enlarged. Pulmonary vascular congestion without pleural effusion or focal consolidation. No pneumothorax. Soft tissue planes and included osseous structure nonsuspicious. Status post median sternotomy for cardiac valve replacement. ACDF. IMPRESSION: LEFT IJ central venous catheter distal tip projects in proximal superior vena cava. No pneumothorax. Stable cardiomegaly and pulmonary vascular congestion. Electronically Signed   By: Elon Alas M.D.   On:  11/19/2016 05:26   Dg Chest Portable 1 View  Result Date: 11/19/2016 CLINICAL DATA:  Sepsis and fever.  Confusion. EXAM: PORTABLE CHEST 1 VIEW COMPARISON:  10/04/2016 FINDINGS: A single AP portable view of the chest demonstrates no focal airspace consolidation or alveolar edema. The lungs are grossly clear. There is no large effusion or pneumothorax. There is unchanged cardiomegaly. Cardiac and mediastinal contours are otherwise unremarkable. IMPRESSION: Mild cardiomegaly, unchanged.  No consolidation or effusion. Electronically Signed   By: Andreas Newport M.D.   On: 11/19/2016 00:19   STUDIES:  10/03/16 Echo>>The cavity size was normal. Wall thickness was  increased in a pattern of mild LVH. Systolic function was normal.  The estimated ejection fraction was in the range of 60% to 65%.  CULTURES: 1/31 BC x2>>negtive  1/31 UC>>  ANTIBIOTICS: 11/18/16 Vancomycin>>11/19/2016 11/18/16 Zosyn>>11/19/2016 11/19/16 Cefepime>>  SIGNIFICANT EVENTS: 2/1>> Patient admitted to ICU with sepsis source unclear but high suspicion for unresolved endocarditis. Just finished  IV antibiotics treatment on 1/29 for endocarditis of the mitral valve   LINES/TUBES: Right upper arm PICC 10/08/16>>11/17/16 Left IJ CVL 02/1>>  ASSESSMENT / PLAN:  PULMONARY A: Acute Respiratory Failure P:   Continuous Bipap wean as tolerated  Maintain O2 sats >92% Bronchodilator therapy prn Intermittent CXR   CARDIOVASCULAR A:  Septic shock source unclear at this point high suspicion for unresolved endocarditis  Hx: CAD, HLD, HTN, and CHF P:  Continuous Telemetry Levophed gtt to maintain map >65 wean as tolerated  Continue aspirin, pravastatin, rivaroxaban, and amiodarone Will hold outpatient lasix, metoprolol and lisinopril for now  RENAL A:   No active issues P:   Trend BMP's Replace electrolytes as indicated Monitor UOP  GASTROINTESTINAL A:    No active issues  Hx: GERD P:   NPO for now until off  Bipap Continue Protonix  HEMATOLOGIC A:   Mild anemia without acute blood loss  P:  Xarelto for VTE prophylaxis Monitor for s/sx of bleeding Monitor CBC's Transfuse for hgb <7  INFECTIOUS A:   Septic shock source unclear high suspicion for unresolved endocarditis  Pyrexia Lactic acidosis Hx: MSSA Endocarditis- Finished I/V cefazolin on 1/29 P:   Trend WBC's and monitor fever curve Trend PCT's D/C vanc and zosyn will start cefepime Follow cultures  ID consulted appreciate input  ENDOCRINE A:   Hx: DM P:   Blood glucose checks q4 hours with SSI coverage  NEUROLOGIC A:   Anxiety  Hx: Neuropathy P:   Continue outpatient gabapentin and Cymbalta Low dose ativan as needed for anxiety  -Update: Pts wife updated about plan of care and questions answered 11/19/16  Marda Stalker, [place] Pager [phone] (please enter 7 digits) PCCM Consult Pager [phone] (please enter 7 digits)    See my note previously  Merton Border, MD PCCM service Mobile [phone] Pager [phone] 11/19/2016

## 2016-11-19 NOTE — Progress Notes (Signed)
Pharmacy progress note:  Discussed drug interactions with cardiologist. Per discussion, have ordered warfarin 5 mg PO daily beginning this evening. Will continue current dose of amiodarone and patient to be monitored.   Lenis Noon, PharmD Clinical Pharmacist 11/19/16 6:30 PM

## 2016-11-19 NOTE — Progress Notes (Signed)
Pharmacy Antibiotic Note  [name] is a 71 y.o. male admitted on 11/18/2016 with sepsis.  Pharmacy has been consulted for Zosyn and vancomycin dosing.  Plan: 1. Zosyn 3.375 gm IV Q8H EI 2. Vancomycin 1 gm IV x 1 in ED followed in 6 hours (stacked dosing) by vancomycin 1 gm IV Q8H, predicted trough 15 mcg/mL. Pharmacy will continue to follow and adjust as needed to maintain trough 15 to 20 mcg/mL.   Vd 67 L, Ke 0.089 hr-1, T1/2 7.8 hr  Height: '5\' 11"'$  (180.3 cm) Weight: 278 lb 14.1 oz (126.5 kg) IBW/kg (Calculated) : 75.3  Temp (24hrs), Avg:103.2 F (39.6 C), Min:103.1 F (39.5 C), Max:103.2 F (39.6 C)   Recent Labs Lab 11/18/16 2328  WBC 12.5*  CREATININE 0.91    Estimated Creatinine Clearance: 102.4 mL/min (by C-G formula based on SCr of 0.91 mg/dL).    Allergies  Allergen Reactions  . Rofecoxib Other (See Comments)    ulcers  . Tetracyclines & Related     Thank you for allowing pharmacy to be a part of this patient's care.  Laural Benes, Pharm.D., BCPS Clinical Pharmacist 11/19/2016 12:54 AM

## 2016-11-19 NOTE — Progress Notes (Signed)
[place] Progress Note Patient Name: [name] DOB: 1946-06-15 MRN: [id number]   Date of Service  11/19/2016  HPI/Events of Note  Hypotension - BP = 83/69. Now getting another liter of crystalloid.  eICU Interventions  Will order: 1. Phenylephrine IV infusion. Titrate to MAP > 65.     Intervention Category Major Interventions: Hypotension - evaluation and management  Fredericka Bottcher [place] 11/19/2016, 2:46 AM

## 2016-11-19 NOTE — Progress Notes (Signed)
Pt is in stable condition at this time. Pt remains on levophed. No complaints of pain on my shift. Pt is alert and oriented at this time. Full assessment in EPIC. Report given to [facility], Therapist, sports.

## 2016-11-20 ENCOUNTER — Inpatient Hospital Stay: Payer: PPO

## 2016-11-20 ENCOUNTER — Ambulatory Visit: Payer: PPO

## 2016-11-20 DIAGNOSIS — B958 Unspecified staphylococcus as the cause of diseases classified elsewhere: Secondary | ICD-10-CM

## 2016-11-20 DIAGNOSIS — I330 Acute and subacute infective endocarditis: Secondary | ICD-10-CM

## 2016-11-20 LAB — COMPREHENSIVE METABOLIC PANEL
ALBUMIN: 3 g/dL — AB (ref 3.5–5.0)
ALK PHOS: 57 U/L (ref 38–126)
ALT: 19 U/L (ref 17–63)
AST: 41 U/L (ref 15–41)
Anion gap: 8 (ref 5–15)
BILIRUBIN TOTAL: 1 mg/dL (ref 0.3–1.2)
BUN: 30 mg/dL — AB (ref 6–20)
CALCIUM: 7.5 mg/dL — AB (ref 8.9–10.3)
CO2: 24 mmol/L (ref 22–32)
CREATININE: 1.44 mg/dL — AB (ref 0.61–1.24)
Chloride: 107 mmol/L (ref 101–111)
GFR calc Af Amer: 55 mL/min — ABNORMAL LOW (ref >60)
GFR calc non Af Amer: 48 mL/min — ABNORMAL LOW (ref >60)
GLUCOSE: 127 mg/dL — AB (ref 65–99)
Potassium: 3.7 mmol/L (ref 3.5–5.1)
Sodium: 139 mmol/L (ref 135–145)
TOTAL PROTEIN: 6.9 g/dL (ref 6.5–8.1)

## 2016-11-20 LAB — GLUCOSE, CAPILLARY
GLUCOSE-CAPILLARY: 120 mg/dL — AB (ref 65–99)
Glucose-Capillary: 126 mg/dL — ABNORMAL HIGH (ref 65–99)
Glucose-Capillary: 123 mg/dL — ABNORMAL HIGH (ref 65–99)
Glucose-Capillary: 116 mg/dL — ABNORMAL HIGH (ref 65–99)
Glucose-Capillary: 108 mg/dL — ABNORMAL HIGH (ref 65–99)

## 2016-11-20 LAB — CBC
HCT: 28 % — ABNORMAL LOW (ref 40.0–52.0)
Hemoglobin: 9.4 g/dL — ABNORMAL LOW (ref 13.0–18.0)
MCH: 28.4 pg (ref 26.0–34.0)
MCHC: 33.4 g/dL (ref 32.0–36.0)
MCV: 85.2 fL (ref 80.0–100.0)
PLATELETS: 132 10*3/uL — AB (ref 150–440)
RBC: 3.29 MIL/uL — ABNORMAL LOW (ref 4.40–5.90)
RDW: 16.2 % — AB (ref 11.5–14.5)
WBC: 10.2 10*3/uL (ref 3.8–10.6)

## 2016-11-20 LAB — URINE CULTURE

## 2016-11-20 LAB — PROCALCITONIN: Procalcitonin: 9.18 ng/mL

## 2016-11-20 LAB — GENTAMICIN LEVEL, RANDOM
GENTAMICIN RM: 2.5 ug/mL
GENTAMICIN RM: 1.3 ug/mL

## 2016-11-20 LAB — PROTIME-INR
INR: 1.62
PROTHROMBIN TIME: 19.4 s — AB (ref 11.4–15.2)

## 2016-11-20 MED ORDER — ACETAMINOPHEN 325 MG PO TABS: 650.0000 mg | ORAL_TABLET | ORAL | Status: DC | PRN

## 2016-11-20 MED ORDER — INSULIN ASPART 100 UNIT/ML ~~LOC~~ SOLN: 0.0000 [IU] | Freq: Every day | SUBCUTANEOUS | Status: DC

## 2016-11-20 MED ORDER — INSULIN ASPART 100 UNIT/ML ~~LOC~~ SOLN
0.0000 [IU] | Freq: Three times a day (TID) | SUBCUTANEOUS | Status: DC
Start: 2016-11-20 — End: 2016-11-24
  Administered 2016-11-21: 2 [IU] via SUBCUTANEOUS
  Administered 2016-11-21: 3 [IU] via SUBCUTANEOUS
  Administered 2016-11-21 – 2016-11-22 (×3): 2 [IU] via SUBCUTANEOUS
  Administered 2016-11-22 – 2016-11-23 (×3): 3 [IU] via SUBCUTANEOUS
  Administered 2016-11-24: 2 [IU] via SUBCUTANEOUS
  Administered 2016-11-24: 3 [IU] via SUBCUTANEOUS
  Filled 2016-11-20: qty 2
  Filled 2016-11-20: qty 3
  Filled 2016-11-20: qty 2
  Filled 2016-11-20 (×2): qty 3
  Filled 2016-11-20 (×2): qty 2
  Filled 2016-11-20: qty 3
  Filled 2016-11-20: qty 2
  Filled 2016-11-20: qty 3

## 2016-11-20 MED ORDER — DEXTROSE 5 % IV SOLN
Freq: Two times a day (BID) | INTRAVENOUS | Status: DC
  Administered 2016-11-20 – 2016-11-23 (×7): via INTRAVENOUS
  Filled 2016-11-20 (×9): qty 2.25

## 2016-11-20 NOTE — Progress Notes (Signed)
Patient alert with times of confusion. He has been on 4-3 liters of oxygen throughout the day. He has been a-fib 80-90's. Dr Josefa Half scheduled TEE for Monday, consent obtained. Patient has not complained of any pain. Diet has been ordered. He has been using urinal and times of incontinence. Blood cultures obtained. Family has been at patients bedside.

## 2016-11-20 NOTE — Progress Notes (Signed)
[place] INFECTIOUS DISEASE PROGRESS NOTE Date of Admission:  11/18/2016     ID: [name] is a 71 y.o. male with MSSA bacteremia Active Problems:   Septic shock ([place])   Subjective: MOre alert, off Bipap still on low dose pressors  ROS  Eleven systems are reviewed and negative except per hpi  Medications:  Antibiotics Given (last 72 hours)    Date/Time Action Medication Dose Rate   11/19/16 0610 Given   piperacillin-tazobactam (ZOSYN) IVPB 3.375 g 3.375 g 12.5 mL/hr   11/19/16 1151 Given   ceFEPIme (MAXIPIME) 2 g in dextrose 5 % 50 mL IVPB 2 g 100 mL/hr   11/19/16 1855 Given   nafcillin 3 g in dextrose 5 % 50 mL IVPB 3 g 100 mL/hr   11/19/16 2321 Given   nafcillin 3 g in dextrose 5 % 50 mL IVPB 3 g 100 mL/hr   11/20/16 0505 Given   nafcillin 3 g in dextrose 5 % 50 mL IVPB 3 g 100 mL/hr   11/20/16 1229 Given   nafcillin 3 g in dextrose 5 % 50 mL IVPB 3 g 100 mL/hr     . amiodarone  200 mg Oral Daily  . aspirin EC  81 mg Oral Daily  . dextrose 5 % 100 mL with gentamicin (GARAMYCIN) 100 mg infusion  100 mg Intravenous Q8H  . DULoxetine  60 mg Oral QHS  . enoxaparin (LOVENOX) injection  40 mg Subcutaneous Q12H  . gabapentin  600 mg Oral TID  . insulin aspart  0-15 Units Subcutaneous TID WC  . insulin aspart  0-5 Units Subcutaneous QHS  . nafcillin IV  3 g Intravenous Q6H  . pantoprazole sodium  20 mg Oral QAC breakfast  . pravastatin  40 mg Oral QHS  . rifampin  300 mg Oral Q8H  . tamsulosin  0.4 mg Oral Daily  . warfarin  5 mg Oral q1800  . Warfarin - Physician Dosing Inpatient   Does not apply q1800    Objective: Vital signs in last 24 hours: Temp:  [97.9 F (36.6 C)-99.1 F (37.3 C)] 98.1 F (36.7 C) (02/02 1200) Pulse Rate:  [67-97] 89 (02/02 1500) Resp:  [16-26] 20 (02/02 1500) BP: (75-181)/(27-147) 93/76 (02/02 1500) SpO2:  [81 %-100 %] 100 % (02/02 1500) FiO2 (%):  [40 %-44 %] 44 % (02/02 0700) Weight:  [125.5 kg (276 lb 10.8 oz)] 125.5 kg  (276 lb 10.8 oz) (02/02 0439) Constitutional: on BiPap, awake, interactive HENT: anicteric Mouth/Throat: Oropharynx is clear and moist. No oropharyngeal exudate.  Cardiovascular: Normal rate, regular rhythm and normal heart sounds.1/6 sm LLSB  Pulmonary/Chest: Effort normal and breath sounds normal. No respiratory distress. He has no wheezes.  Abdominal: Soft. Bowel sounds are normal. He exhibits no distension. There is no tenderness.  Lymphadenopathy: He has no cervical adenopathy.  Neurological: He is alert and oriented to person, place, and time.  Skin: Skin is warm and dry. No rash noted. No erythema.  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 His behavior is normal.  L Neck IJ  Lab Results  Recent Labs  11/18/16 2328 11/20/16 0444  WBC 12.5* 10.2  HGB 11.8* 9.4*  HCT 34.5* 28.0*  NA 135 139  K 4.3 3.7  CL 98* 107  CO2 27 24  BUN 17 30*  CREATININE 0.91 1.44*    Microbiology: '@micro'$ @ Studies/Results: Dg  Chest Port 1 View  Result Date: 11/20/2016 CLINICAL DATA:  Respiratory failure . EXAM: PORTABLE CHEST 1 VIEW COMPARISON:  11/19/2016.  10/04/2016. FINDINGS: Left IJ line in stable position. Prior CABG. Prior cardiac valve replacement. Cardiomegaly with pulmonary vascular prominence and interstitial prominence noted consistent with CHF. No pleural effusion or pneumothorax. Prior cervical spine fusion stable in appearance from multiple prior exams. IMPRESSION: 1.  Left IJ line stable position. 2. Prior CABG. Prior cardiac valve replacement. Congestive heart failure mild pulmonary interstitial edema. Electronically Signed   By: Marcello Moores  Register   On: 11/20/2016 07:09   Dg Chest Port 1 View  Result Date: 11/19/2016 CLINICAL DATA:  Central line placement. EXAM: PORTABLE CHEST 1 VIEW COMPARISON:  Chest radiograph January 16, 2017 FINDINGS: LEFT internal jugular central venous  catheter distal tip projects at proximal superior vena cava. The cardiac silhouette is similarly enlarged. Pulmonary vascular congestion without pleural effusion or focal consolidation. No pneumothorax. Soft tissue planes and included osseous structure nonsuspicious. Status post median sternotomy for cardiac valve replacement. ACDF. IMPRESSION: LEFT IJ central venous catheter distal tip projects in proximal superior vena cava. No pneumothorax. Stable cardiomegaly and pulmonary vascular congestion. Electronically Signed   By: Elon Alas M.D.   On: 11/19/2016 05:26   Dg Chest Portable 1 View  Result Date: 11/19/2016 CLINICAL DATA:  Sepsis and fever.  Confusion. EXAM: PORTABLE CHEST 1 VIEW COMPARISON:  10/04/2016 FINDINGS: A single AP portable view of the chest demonstrates no focal airspace consolidation or alveolar edema. The lungs are grossly clear. There is no large effusion or pneumothorax. There is unchanged cardiomegaly. Cardiac and mediastinal contours are otherwise unremarkable. IMPRESSION: Mild cardiomegaly, unchanged.  No consolidation or effusion. Electronically Signed   By: Andreas Newport M.D.   On: 11/19/2016 00:19    Assessment/Plan: Assessment:   [name] is a 71 y.o. 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  He was doing very well and I saw last week and he stopped ancef 1/29 and picc pulled 1/30.  However within 48 hours developed chills and fevers and confusion. On admit temp 103 and wbc elevated at 12.  BCX + MSSA again. 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  The knee however looks very good and he has no pain in it. He also has no other evidence of sites of residual infection so I do suspect it is the prosthetic  valve. Echo still shows MV veg  Recommendations Cont nafcillin gent and rifampin Repeat bcx today   - will getTEE when more stable   Discussed with Pt, wife and staff. Thank you very much for the consult. Will follow with you.  Tomasina Keasling P   11/20/2016, 3:48 PM

## 2016-11-20 NOTE — Progress Notes (Signed)
[facility]  Patient Status: Active  [facility] is providing the following services: SN/PT  If patient discharges after hours, please call [phone].   [name] 11/20/2016, 11:25 AM

## 2016-11-20 NOTE — Progress Notes (Signed)
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 He returns with sepsis and bacteremia. Echo 11/19/2016 showed possible residual mitral vegetation, aortic valve was not well visualized.  SUBJECTIVE: The patient reports feeling better today, he denies chest pain. He has mild dyspnea which improves with BiPAP.   Vitals:   11/20/16 0500 11/20/16 0600 11/20/16 0700 11/20/16 0800  BP: 107/72 129/75 100/79 99/61  Pulse: 94 80 84 83  Resp: 19 (!) '25 19 19  '$ Temp:   97.9 F (36.6 C)   TempSrc:      SpO2: 100% (!) 84% 96% 90%  Weight:      Height:         Intake/Output Summary (Last 24 hours) at 11/20/16 8938 Last data filed at 11/20/16 0600  Gross per 24 hour  Intake          3798.59 ml  Output              351 ml  Net          3447.59 ml      PHYSICAL EXAM  General: Well developed, well nourished, in no acute distress HEENT:  Normocephalic and atramatic Neck:  No JVD.  Lungs: Clear bilaterally to auscultation and percussion. Heart: HRRR . Normal S1 and S2 without gallops or murmurs.  Abdomen: Bowel sounds are positive, abdomen soft and non-tender  Msk:  Back normal. Normal strength and tone for age. Extremities: No clubbing, cyanosis or edema.   Neuro: Alert and oriented X 3. Psych:  Good affect, responds appropriately   LABS: Basic Metabolic Panel:  Recent Labs  11/18/16 2328 11/20/16 0444  NA 135 139  K 4.3 3.7  CL 98* 107  CO2 27 24  GLUCOSE 134* 127*  BUN 17 30*  CREATININE 0.91 1.44*  CALCIUM 8.7* 7.5*   Liver Function Tests:  Recent Labs  11/18/16 2328 11/20/16 0444  AST 28 41  ALT 11* 19  ALKPHOS 110 57  BILITOT 0.5 1.0  PROT 8.5* 6.9  ALBUMIN 3.9 3.0*   No results for input(s): LIPASE, AMYLASE in the last 72 hours. CBC:  Recent Labs  11/18/16 2328 11/20/16 0444  WBC 12.5* 10.2  NEUTROABS 10.9*  --   HGB 11.8* 9.4*  HCT 34.5*  28.0*  MCV 83.5 85.2  PLT 250 132*   Cardiac Enzymes: No results for input(s): CKTOTAL, CKMB, CKMBINDEX, TROPONINI in the last 72 hours. BNP: Invalid input(s): POCBNP D-Dimer: No results for input(s): DDIMER in the last 72 hours. Hemoglobin A1C: No results for input(s): HGBA1C in the last 72 hours. Fasting Lipid Panel: No results for input(s): CHOL, HDL, LDLCALC, TRIG, CHOLHDL, LDLDIRECT in the last 72 hours. Thyroid Function Tests: No results for input(s): TSH, T4TOTAL, T3FREE, THYROIDAB in the last 72 hours.  Invalid input(s): FREET3 Anemia Panel: No results for input(s): VITAMINB12, FOLATE, FERRITIN, TIBC, IRON, RETICCTPCT in the last 72 hours.  Dg Chest Port 1 View  Result Date: 11/20/2016 CLINICAL DATA:  Respiratory failure . EXAM: PORTABLE CHEST 1 VIEW COMPARISON:  11/19/2016.  10/04/2016. FINDINGS: Left IJ line in stable position. Prior CABG. Prior cardiac valve replacement. Cardiomegaly with pulmonary vascular prominence and interstitial prominence noted consistent with CHF. No pleural effusion or pneumothorax. Prior cervical spine fusion stable in appearance from multiple prior exams. IMPRESSION: 1.  Left IJ line stable position. 2. Prior CABG. Prior cardiac valve replacement. Congestive heart failure mild pulmonary  interstitial edema. Electronically Signed   By: Marcello Moores  Register   On: 11/20/2016 07:09   Dg Chest Port 1 View  Result Date: 11/19/2016 CLINICAL DATA:  Central line placement. EXAM: PORTABLE CHEST 1 VIEW COMPARISON:  Chest radiograph January 16, 2017 FINDINGS: LEFT internal jugular central venous catheter distal tip projects at proximal superior vena cava. The cardiac silhouette is similarly enlarged. Pulmonary vascular congestion without pleural effusion or focal consolidation. No pneumothorax. Soft tissue planes and included osseous structure nonsuspicious. Status post median sternotomy for cardiac valve replacement. ACDF. IMPRESSION: LEFT IJ central venous catheter  distal tip projects in proximal superior vena cava. No pneumothorax. Stable cardiomegaly and pulmonary vascular congestion. Electronically Signed   By: Elon Alas M.D.   On: 11/19/2016 05:26   Dg Chest Portable 1 View  Result Date: 11/19/2016 CLINICAL DATA:  Sepsis and fever.  Confusion. EXAM: PORTABLE CHEST 1 VIEW COMPARISON:  10/04/2016 FINDINGS: A single AP portable view of the chest demonstrates no focal airspace consolidation or alveolar edema. The lungs are grossly clear. There is no large effusion or pneumothorax. There is unchanged cardiomegaly. Cardiac and mediastinal contours are otherwise unremarkable. IMPRESSION: Mild cardiomegaly, unchanged.  No consolidation or effusion. Electronically Signed   By: Andreas Newport M.D.   On: 11/19/2016 00:19     Echo: - Procedure narrative: Transthoracic echocardiography. The study was technically difficult. - Left ventricle: The cavity size was normal. Wall thickness was normal. Systolic function was vigorous. The estimated ejection fraction was in the range of 65% to 70%. - Aortic valve: A bioprosthesis was present. Cannot exclude vegetation. Valve area (Vmax): 2.7 cm^2. - Mitral valve: There was a vegetation. There was mild regurgitation.  TELEMETRY: Afib, rate controlled  ASSESSMENT AND PLAN:  Active Problems:   Septic shock ([place])    1. 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 s/p AVR with bioprosthetic valve 3. Paroxysmal atrial fibrillation/atrial flutter, warfarin for stroke prevention, and on amiodarone for rhythm control  Recommendations  1. Continue nafcillin and rifampin and nafcillin per Dr. Ola Spurr 2. DC Xarelto 3. Continue warfarin 5 mg daily, target INR 2.0-3.0 4. Continue amiodarone at current dose, may need to adjust pending patient's clinical response 5. Plan for TEE on Monday 11/23/2016 to rule out aortic bioprosthetic valve endocarditis   Paige Horcher, PA-S 11/20/2016 8:22  AM

## 2016-11-20 NOTE — Care Management Note (Signed)
Case Management Note  Patient Details  Name: [name] MRN: [id number] Date of Birth: 26-Sep-1946  Additional Comments:  Patient presents from home and admitted with sepsis and hypoxia.  he is requiring continuous bipap and levophed. has had a recent stay in skilled nursing. When medically stable will request PT/OT consults.   Expected Discharge Date:                  Expected Discharge Plan:   (Unsure at present.)  In-House Referral:     Discharge planning Services     Post Acute Care Choice:    Choice offered to:     DME Arranged:    DME Agency:     HH Arranged:    HH Agency:     Status of Service:     If discussed at [employer] of Stay Meetings, dates discussed:     Katrina Stack, RN 11/20/2016, 8:29 AM

## 2016-11-20 NOTE — Progress Notes (Signed)
Pharmacy Antibiotic Note  [name] is a 71 y.o. male admitted on 11/18/2016 with bacteremia and endocarditis.  Pharmacy has been consulted for cefepime dosing. Patient was on cefazolin outpatient with PICC line placement for MSSA endocarditis. Recently PICC line taken out on 1/29.   Plan: Dr. Ola Spurr recommended starting nafcillin, gentamicin, and rifampin in this patient.   Initiated:  Nafcillin 3g IV Q6h   Rifampin '300mg'$  PO Q8h  Gentamicin '100mg'$  IV Q8h.   Ordered Gentamicin peak and trough. Trough ordered today for @ 1330 resulted at 2.5. The 1400 gentamicin dose held. Will order another gentamicin random level in 4 hours and dose off of patient specific kinetics. Goal peak 3-4 and trough <1.   Drug-drug interactions with rifampin: discussed with cardiologist  - amiodarone and rifampin: decrease efficacy of amiodarone   - oral anticoagulants:    Xarelto, Eliquis, Dabigatran- category X   Warfarin- category C  Height: '5\' 10"'$  (177.8 cm) Weight: 276 lb 10.8 oz (125.5 kg) IBW/kg (Calculated) : 73  Temp (24hrs), Avg:98.4 F (36.9 C), Min:97.9 F (36.6 C), Max:99.1 F (37.3 C)   Recent Labs Lab 11/18/16 2328 11/19/16 0216 11/20/16 0444 11/20/16 1325  WBC 12.5*  --  10.2  --   CREATININE 0.91  --  1.44*  --   LATICACIDVEN 2.4* 3.0*  --   --   GENTRANDOM  --   --   --  2.5    Estimated Creatinine Clearance: 63.5 mL/min (by C-G formula based on SCr of 1.44 mg/dL (H)).    Allergies  Allergen Reactions  . Rofecoxib Other (See Comments)    ulcers  . Tetracyclines & Related     Antimicrobials this admission: Vanc 1/31 >> 1/31 Zosyn 1/31 >> 1/31 Cefepime 2/1 >>2/1   Microbiology results: 2/1 BCx: Staph species; mec A not detected  2/1 UCx: sent  2/1 MRSA PCR: neg   Thank you for allowing pharmacy to be a part of this patient's care.  Loree Fee, PharmD 11/20/2016 3:06 PM

## 2016-11-20 NOTE — Progress Notes (Signed)
PULMONARY / CRITICAL CARE MEDICINE   Name: [name] MRN: [id number] DOB: Mar 26, 1946    ADMISSION DATE:  11/18/2016 CONSULTATION DATE: 11/19/16  REFERRING MD:  Dr. Owens Shark  CHIEF COMPLAINT:  Confusion, fever and tachycardia  PT PROFILE: 10 M s/p AVR 2012, recently completed 6 weeks cefazolin for mitral valve endocarditis and presented 2 days after completion of abx with severe sepsis, septic shock. Blood cultures again positive for MSSA. Echocardiogram c/w mitral valve vegetation and possible prosthetic aortic valve vegetation.   MAJOR EVENTS/TEST RESULTS: 01/31 Admission with severe sepsis, septic shock, AKI, acute delirium, respiratory failure requiring BiPAP 02/01 Blood cultures positive with MSSA 02/01 ID consultation: Abx adjusted 02/01 Echocardiogram: LVEF 65% to 70%. A bioprosthesis AV was present. Cannot exclude vegetation. There was MV a vegetation. There was mild MR   INDWELLING DEVICES:: L IJ CVL 02/01 >>   MICRO DATA: Urine 01/31 >> insignificant growth Blood 01/31 >> MSSA Blood 02/03 >>    ANTIMICROBIALS:  Vanc 01/31 >> 02/01 Cefepime 01/31 >> 02/01 Nafcillin 02/01 >>  Norva Karvonen 02/01 >>  Rifampin 02/01 >>   SUBJECTIVE:  Tolerating off BiPAP. Remains on low dose NE - weaning to off. Cognition intact  VITAL SIGNS: BP 109/86   Pulse 89   Temp 98.1 F (36.7 C)   Resp (!) 21   Ht '5\' 10"'$  (1.778 m)   Wt 125.5 kg (276 lb 10.8 oz)   SpO2 100%   BMI 39.70 kg/m   HEMODYNAMICS:    VENTILATOR SETTINGS: FiO2 (%):  [40 %-44 %] 44 %  INTAKE / OUTPUT: I/O last 3 completed shifts: In: 4192.1 [I.V.:4042.1; IV Piggyback:150] Out: 351 [Urine:351]  PHYSICAL EXAMINATION: General: NAD on [place] O2 Neuro: Cognition intact, CNs intact, no focal deficits HEENT: NCAT, sclera white Cardiovascular: IRIR, rate controlled, soft systolic M Lungs: faint bibasilar crackles, no wheezes Abdomen:  Obese, soft, active bowel sounds Ext: no edema  LABS:  BMET  Recent  Labs Lab 11/18/16 2328 11/20/16 0444  NA 135 139  K 4.3 3.7  CL 98* 107  CO2 27 24  BUN 17 30*  CREATININE 0.91 1.44*  GLUCOSE 134* 127*    Electrolytes  Recent Labs Lab 11/18/16 2328 11/20/16 0444  CALCIUM 8.7* 7.5*    CBC  Recent Labs Lab 11/18/16 2328 11/20/16 0444  WBC 12.5* 10.2  HGB 11.8* 9.4*  HCT 34.5* 28.0*  PLT 250 132*    Coag's  Recent Labs Lab 11/18/16 2328 11/20/16 0444  INR 1.47 1.62    Sepsis Markers  Recent Labs Lab 11/18/16 2328 11/19/16 0216 11/20/16 0444  LATICACIDVEN 2.4* 3.0*  --   PROCALCITON  --  3.84 9.18    ABG No results for input(s): PHART, PCO2ART, PO2ART in the last 168 hours.  Liver Enzymes  Recent Labs Lab 11/18/16 2328 11/20/16 0444  AST 28 41  ALT 11* 19  ALKPHOS 110 57  BILITOT 0.5 1.0  ALBUMIN 3.9 3.0*    Cardiac Enzymes No results for input(s): TROPONINI, PROBNP in the last 168 hours.  Glucose  Recent Labs Lab 11/19/16 1636 11/19/16 2002 11/19/16 2320 11/20/16 0414 11/20/16 0803 11/20/16 1155  GLUCAP 125* 89 120* 126* 123* 116*    CXR: mild interstitial edema   ASSESSMENT / PLAN: Recurrent bacterial endocarditis Acute hypoxic respiratory failure  Mild pulmonary edema pattern (vs ALI) on CXR Chronic AF - rate controlled Septic shock - improving AKI, nonoliguric DM 2 - controlled Hx of neuropathy  P:   Continue PRN BiPAP  Continue supplemental O2 Continue amiodarone Wean NE to off for MAP > 65 mmHg Monitor BMET intermittently Monitor I/Os Correct electrolytes as indicated Monitor temp, WBC count Micro and abx as above ID consultation appreciated  All abx per ID Service DVT px: SQ heparin Monitor CBC intermittently Transfuse per usual guidelines TEE planned first of next week Continue home dose gabapentin and cymbalta  If he gets off NE and is able to tolerate off BiPAP, he can transfer to telemetry 02/03  Merton Border, MD PCCM service Mobile [phone] Pager  [phone] 11/20/2016, 4:19 PM

## 2016-11-20 NOTE — Progress Notes (Signed)
Pharmacy Antibiotic Note  [name] is a 71 y.o. male admitted on 11/18/2016 with bacteremia and endocarditis.  Pharmacy has been consulted for cefepime dosing. Patient was on cefazolin outpatient with PICC line placement for MSSA endocarditis. Recently PICC line taken out on 1/29.   Plan: Dr. Ola Spurr recommended starting nafcillin, gentamicin, and rifampin in this patient.   Initiated:  Nafcillin 3g IV Q6h   Rifampin '300mg'$  PO Q8h  Gentamicin '100mg'$  IV Q8h.   First gentamicin trough @ 1330 today resulted at 2.5. Dose not given at 1400 today. Gentamicin trough ordered for 1730 resulted at 1.3.  Based on patient specific kinetics (ke=0.16) will administer a dose at 2200 and change patients dose and interval based on kinetics. Estimated peak 3.6 and estimated trough 0.58. Will give gentamicin '90mg'$  IV Q12h. Check peak and trough prior to the third dose. Continue to trend renal function. Goal peak 3-4 and trough <1.   Drug-drug interactions with rifampin: discussed with cardiologist  - amiodarone and rifampin: decrease efficacy of amiodarone   - oral anticoagulants:    Xarelto, Eliquis, Dabigatran- category X   Warfarin- category C  Height: '5\' 10"'$  (177.8 cm) Weight: 276 lb 10.8 oz (125.5 kg) IBW/kg (Calculated) : 73  Temp (24hrs), Avg:98.2 F (36.8 C), Min:97.9 F (36.6 C), Max:98.6 F (37 C)   Recent Labs Lab 11/18/16 2328 11/19/16 0216 11/20/16 0444 11/20/16 1325 11/20/16 1713  WBC 12.5*  --  10.2  --   --   CREATININE 0.91  --  1.44*  --   --   LATICACIDVEN 2.4* 3.0*  --   --   --   GENTRANDOM  --   --   --  2.5 1.3    Estimated Creatinine Clearance: 63.5 mL/min (by C-G formula based on SCr of 1.44 mg/dL (H)).    Allergies  Allergen Reactions  . Rofecoxib Other (See Comments)    ulcers  . Tetracyclines & Related     Antimicrobials this admission: Vanc 1/31 >> 1/31 Zosyn 1/31 >> 1/31 Cefepime 2/1 >>2/1 Nafcillin 2/2 >> Rifampin 2/2 >> Gentamicin 2/2>>     Microbiology results: 2/2 BCx: sent  2/2 BCx: Staph species 2/1 BCx: Staph species; mec A not detected  2/1 UCx: sent  2/1 MRSA PCR: neg   Thank you for allowing pharmacy to be a part of this patient's care.  Loree Fee, PharmD 11/20/2016 7:36 PM

## 2016-11-21 LAB — CULTURE, BLOOD (ROUTINE X 2)

## 2016-11-21 LAB — BASIC METABOLIC PANEL
Anion gap: 5 (ref 5–15)
BUN: 22 mg/dL — AB (ref 6–20)
CALCIUM: 7.8 mg/dL — AB (ref 8.9–10.3)
CO2: 25 mmol/L (ref 22–32)
Chloride: 104 mmol/L (ref 101–111)
Creatinine, Ser: 1.27 mg/dL — ABNORMAL HIGH (ref 0.61–1.24)
GFR calc Af Amer: >60 mL/min (ref >60)
GFR, EST NON AFRICAN AMERICAN: 56 mL/min — AB (ref >60)
GLUCOSE: 138 mg/dL — AB (ref 65–99)
Potassium: 3.3 mmol/L — ABNORMAL LOW (ref 3.5–5.1)
Sodium: 134 mmol/L — ABNORMAL LOW (ref 135–145)

## 2016-11-21 LAB — GLUCOSE, CAPILLARY
GLUCOSE-CAPILLARY: 154 mg/dL — AB (ref 65–99)
GLUCOSE-CAPILLARY: 150 mg/dL — AB (ref 65–99)
GLUCOSE-CAPILLARY: 140 mg/dL — AB (ref 65–99)
Glucose-Capillary: 123 mg/dL — ABNORMAL HIGH (ref 65–99)

## 2016-11-21 LAB — CBC
HEMATOCRIT: 26.9 % — AB (ref 40.0–52.0)
HEMOGLOBIN: 9.3 g/dL — AB (ref 13.0–18.0)
MCH: 28.8 pg (ref 26.0–34.0)
MCHC: 34.3 g/dL (ref 32.0–36.0)
MCV: 83.7 fL (ref 80.0–100.0)
Platelets: 118 10*3/uL — ABNORMAL LOW (ref 150–440)
RBC: 3.22 MIL/uL — ABNORMAL LOW (ref 4.40–5.90)
RDW: 15.9 % — AB (ref 11.5–14.5)
WBC: 8 10*3/uL (ref 3.8–10.6)

## 2016-11-21 LAB — PROTIME-INR
INR: 1.43
PROTHROMBIN TIME: 17.6 s — AB (ref 11.4–15.2)

## 2016-11-21 LAB — PROCALCITONIN: Procalcitonin: 4.49 ng/mL

## 2016-11-21 MED ORDER — IPRATROPIUM-ALBUTEROL 0.5-2.5 (3) MG/3ML IN SOLN: 3.0000 mL | Freq: Four times a day (QID) | RESPIRATORY_TRACT | Status: DC | PRN

## 2016-11-21 MED ORDER — POTASSIUM CHLORIDE CRYS ER 20 MEQ PO TBCR
40.0000 meq | EXTENDED_RELEASE_TABLET | Freq: Once | ORAL | Status: AC
  Administered 2016-11-21: 40 meq via ORAL
  Filled 2016-11-21: qty 2

## 2016-11-21 MED ORDER — VANCOMYCIN HCL IN DEXTROSE 1-5 GM/200ML-% IV SOLN: 1000.0000 mg | Freq: Three times a day (TID) | INTRAVENOUS | Status: DC

## 2016-11-21 MED ORDER — PIPERACILLIN-TAZOBACTAM 3.375 G IVPB: 3.3750 g | Freq: Three times a day (TID) | INTRAVENOUS | Status: DC

## 2016-11-21 MED ORDER — ENSURE ENLIVE PO LIQD
237.0000 mL | Freq: Two times a day (BID) | ORAL | Status: DC
  Administered 2016-11-21 – 2016-11-24 (×5): 237 mL via ORAL

## 2016-11-21 NOTE — Progress Notes (Signed)
Patient alert with times of confusion. He is on 2 liters of oxygen. No complaints through shift. Tolerated diet, used urinal and had a large bowel movement during shift. Vitals have been with in normal limit. Plan is to have TEE on Monday.

## 2016-11-21 NOTE — Progress Notes (Signed)
PULMONARY / CRITICAL CARE MEDICINE   Name: [name] MRN: [id number] DOB: 28-Mar-1946    ADMISSION DATE:  11/18/2016 CONSULTATION DATE: 11/19/16  REFERRING MD:  Dr. Owens Shark  CHIEF COMPLAINT:  Confusion, fever and tachycardia  PT PROFILE: 47 M s/p AVR 2012, recently completed 6 weeks cefazolin for mitral valve endocarditis and presented 2 days after completion of abx with severe sepsis, septic shock. Blood cultures again positive for MSSA. Echocardiogram c/w mitral valve vegetation and possible prosthetic aortic valve vegetation.   MAJOR EVENTS/TEST RESULTS: 01/31 Admission with severe sepsis, septic shock, AKI, acute delirium, respiratory failure requiring BiPAP 02/01 Blood cultures positive with MSSA 02/01 ID consultation: Abx adjusted 02/01 Echocardiogram: LVEF 65% to 70%. A bioprosthesis AV was present. Cannot exclude vegetation. There was MV a vegetation. There was mild MR   INDWELLING DEVICES:: L IJ CVL 02/01 >>   MICRO DATA: Urine 01/31 >> insignificant growth Blood 01/31 >> MSSA Blood 02/03 >>    ANTIMICROBIALS:  Vanc 01/31 >> 02/01 Cefepime 01/31 >> 02/01 Nafcillin 02/01 >>  Norva Karvonen 02/01 >>  Rifampin 02/01 >>   SUBJECTIVE:  Off BiPAP and off levophed since 2/2 at 1800. Cognition intact. No issues overnight. SPO2>94% on   VITAL SIGNS: BP (!) 110/58   Pulse 96   Temp 99.2 F (37.3 C) (Oral)   Resp 17   Ht '5\' 10"'$  (1.778 m)   Wt 126.7 kg (279 lb 5.2 oz)   SpO2 100%   BMI 40.08 kg/m   HEMODYNAMICS:    VENTILATOR SETTINGS:    INTAKE / OUTPUT: I/O last 3 completed shifts: In: 4059.4 [P.O.:540; I.V.:3169.4; IV Piggyback:350] Out: 0240 [XBDZH:2992; Stool:1]  PHYSICAL EXAMINATION: General: NAD on [place] O2 Neuro: Cognition intact, CNs intact, no focal deficits HEENT: NCAT, sclera white Cardiovascular: IRIR, rate controlled, soft systolic M Lungs: faint bibasilar crackles, no wheezes Abdomen:  Obese, soft, active bowel sounds Ext: no  edema  LABS:  BMET  Recent Labs Lab 11/18/16 2328 11/20/16 0444 11/21/16 0415  NA 135 139 134*  K 4.3 3.7 3.3*  CL 98* 107 104  CO2 '27 24 25  '$ BUN 17 30* 22*  CREATININE 0.91 1.44* 1.27*  GLUCOSE 134* 127* 138*    Electrolytes  Recent Labs Lab 11/18/16 2328 11/20/16 0444 11/21/16 0415  CALCIUM 8.7* 7.5* 7.8*    CBC  Recent Labs Lab 11/18/16 2328 11/20/16 0444 11/21/16 0415  WBC 12.5* 10.2 8.0  HGB 11.8* 9.4* 9.3*  HCT 34.5* 28.0* 26.9*  PLT 250 132* 118*    Coag's  Recent Labs Lab 11/18/16 2328 11/20/16 0444 11/21/16 0415  INR 1.47 1.62 1.43    Sepsis Markers  Recent Labs Lab 11/18/16 2328 11/19/16 0216 11/20/16 0444 11/21/16 0415  LATICACIDVEN 2.4* 3.0*  --   --   PROCALCITON  --  3.84 9.18 4.49    ABG No results for input(s): PHART, PCO2ART, PO2ART in the last 168 hours.  Liver Enzymes  Recent Labs Lab 11/18/16 2328 11/20/16 0444  AST 28 41  ALT 11* 19  ALKPHOS 110 57  BILITOT 0.5 1.0  ALBUMIN 3.9 3.0*    Cardiac Enzymes No results for input(s): TROPONINI, PROBNP in the last 168 hours.  Glucose  Recent Labs Lab 11/19/16 2320 11/20/16 0414 11/20/16 0803 11/20/16 1155 11/20/16 1635 11/20/16 2140  GLUCAP 120* 126* 123* 116* 120* 108*    CXR: mild interstitial edema   ASSESSMENT / PLAN: Recurrent bacterial endocarditis Acute hypoxic respiratory failure  Mild pulmonary edema pattern (vs ALI)  on CXR Chronic AF - rate controlled Septic shock - improving AKI, nonoliguric DM 2 - controlled Hx of neuropathy  P:   Continue supplemental O2 via [place] to keep SPO2>90% Continue amiodarone Monitor BP Monitor BMET intermittently Monitor I/Os Correct electrolytes as indicated Monitor temp, WBC count Micro and abx as above ID consultation appreciated  All abx per ID Service DVT px: SQ heparin Monitor CBC intermittently Transfuse per usual guidelines TEE planned first of next week Continue home dose gabapentin and  cymbalta  Patient has been off levophed for >12hrs. Sign-out given to Dr. Posey Pronto at [phone]. Transfer orders placed for 2A  [facility] ANP-BC Pulmonary and Critical Care Medicine [facility] Pager [phone] or [phone]  11/21/2016, 7:39 AM

## 2016-11-21 NOTE — Progress Notes (Signed)
[facility] Card[facility]  SUBJECTIVE: I'm feeling better   Vitals:   11/21/16 0500 11/21/16 0559 11/21/16 0600 11/21/16 0700  BP: 129/75  112/75 (!) 110/58  Pulse: (!) 107  97 96  Resp: (!) 21  (!) 24 17  Temp:    98.3 F (36.8 C)  TempSrc:      SpO2: 95%  99% 100%  Weight:  126.7 kg (279 lb 5.2 oz)    Height:         Intake/Output Summary (Last 24 hours) at 11/21/16 1007 Last data filed at 11/21/16 1006  Gross per 24 hour  Intake          1229.17 ml  Output             3326 ml  Net         -2096.83 ml      PHYSICAL EXAM  General: Well developed, well nourished, in no acute distress HEENT:  Normocephalic and atramatic Neck:  No JVD.  Lungs: Clear bilaterally to auscultation and percussion. Heart: HRRR . Normal S1 and S2 without gallops or murmurs.  Abdomen: Bowel sounds are positive, abdomen soft and non-tender  Msk:  Back normal, normal gait. Normal strength and tone for age. Extremities: No clubbing, cyanosis or edema.   Neuro: Alert and oriented X 3. Psych:  Good affect, responds appropriately   LABS: Basic Metabolic Panel:  Recent Labs  11/20/16 0444 11/21/16 0415  NA 139 134*  K 3.7 3.3*  CL 107 104  CO2 24 25  GLUCOSE 127* 138*  BUN 30* 22*  CREATININE 1.44* 1.27*  CALCIUM 7.5* 7.8*   Liver Function Tests:  Recent Labs  11/18/16 2328 11/20/16 0444  AST 28 41  ALT 11* 19  ALKPHOS 110 57  BILITOT 0.5 1.0  PROT 8.5* 6.9  ALBUMIN 3.9 3.0*   No results for input(s): LIPASE, AMYLASE in the last 72 hours. CBC:  Recent Labs  11/18/16 2328 11/20/16 0444 11/21/16 0415  WBC 12.5* 10.2 8.0  NEUTROABS 10.9*  --   --   HGB 11.8* 9.4* 9.3*  HCT 34.5* 28.0* 26.9*  MCV 83.5 85.2 83.7  PLT 250 132* 118*   Cardiac Enzymes: No results for input(s): CKTOTAL, CKMB, CKMBINDEX, TROPONINI in the last 72 hours. BNP: Invalid input(s): POCBNP D-Dimer: No results for input(s): DDIMER in the last 72 hours. Hemoglobin A1C: No results for input(s): HGBA1C in  the last 72 hours. Fasting Lipid Panel: No results for input(s): CHOL, HDL, LDLCALC, TRIG, CHOLHDL, LDLDIRECT in the last 72 hours. Thyroid Function Tests: No results for input(s): TSH, T4TOTAL, T3FREE, THYROIDAB in the last 72 hours.  Invalid input(s): FREET3 Anemia Panel: No results for input(s): VITAMINB12, FOLATE, FERRITIN, TIBC, IRON, RETICCTPCT in the last 72 hours.  Dg Chest Port 1 View  Result Date: 11/20/2016 CLINICAL DATA:  Respiratory failure . EXAM: PORTABLE CHEST 1 VIEW COMPARISON:  11/19/2016.  10/04/2016. FINDINGS: Left IJ line in stable position. Prior CABG. Prior cardiac valve replacement. Cardiomegaly with pulmonary vascular prominence and interstitial prominence noted consistent with CHF. No pleural effusion or pneumothorax. Prior cervical spine fusion stable in appearance from multiple prior exams. IMPRESSION: 1.  Left IJ line stable position. 2. Prior CABG. Prior cardiac valve replacement. Congestive heart failure mild pulmonary interstitial edema. Electronically Signed   By: Marcello Moores  Register   On: 11/20/2016 07:09     Echo LV EF 65-70%, small residual vegetation observed on mitral valve, cannot rule out vegetation aortic valve bioprosthesis  TELEMETRY: Atrial  fibrillation:  ASSESSMENT AND PLAN:  Active Problems:   Septic shock ([place])    1. Recurrent bacteremia/sepsis, improved, with known mitral valve MSSA endocarditis 2. Status post AVR with bioprosthetic valve 3. Paroxysmal atrial fibrillation/atrial flutter on warfarin for stroke prevention and amiodarone for rhythm control  Recommendations  1. Continue current therapy 2. Continue warfarin for stroke prevention, target INR 2.0-3.0 3. Continue amiodarone for rhythm control 4. TEE  scheduled for 11/23/2016 to rule out aortic bioprosthetic valve endocarditis   Isaias Cowman, MD, PhD, [facility] 11/21/2016 10:07 AM

## 2016-11-21 NOTE — Progress Notes (Signed)
Patient signed out by ICU NP. She will round on pt today and hospitalist will round from 11/22/16

## 2016-11-21 NOTE — Progress Notes (Signed)
Patient accepted into room 249.  Assessment complete, see flow sheet.  Patient oriented to room, call bell placed with in reach after patient demonstrated use.

## 2016-11-21 NOTE — Progress Notes (Signed)
Pharmacy Antibiotic Note  [name] is a 71 y.o. male admitted on 11/18/2016 with bacteremia and endocarditis.  Pharmacy has been consulted for cefepime dosing. MSSA bacteremia. Patient was on cefazolin outpatient with PICC line placement for MSSA endocarditis. Recently PICC line taken out on 1/29- see Dr. Ola Spurr note.   Plan: Dr. Ola Spurr recommended starting nafcillin, gentamicin, and rifampin in this patient.   1. Antibiotics:  Nafcillin 3g IV Q6h   Rifampin '300mg'$  PO Q8h  Gentamicin '90mg'$  IV Q12h.   First gentamicin trough @ 1330 today resulted at 2.5. Dose not given at 1400 today. Gentamicin trough ordered for 1730 resulted at 1.3.  Based on patient specific kinetics (ke=0.16) will administer a dose at 2200 and change patients dose and interval based on kinetics. Estimated peak 3.6 and estimated trough 0.58.  Will give gentamicin '90mg'$  IV Q12h. Check peak and trough prior to the third dose. Continue to trend renal function. Goal peak 3-4 and trough <1.   2. Drug-drug interactions with rifampin: discussed with cardiologist  - amiodarone and rifampin: decrease efficacy of amiodarone   - oral anticoagulants:    Xarelto, Eliquis, Dabigatran- category X   Warfarin- category C    Height: '5\' 10"'$  (177.8 cm) Weight: 279 lb 5.2 oz (126.7 kg) IBW/kg (Calculated) : 73  Temp (24hrs), Avg:98.7 F (37.1 C), Min:98.1 F (36.7 C), Max:99.2 F (37.3 C)   Recent Labs Lab 11/18/16 2328 11/19/16 0216 11/20/16 0444 11/20/16 1325 11/20/16 1713 11/21/16 0415  WBC 12.5*  --  10.2  --   --  8.0  CREATININE 0.91  --  1.44*  --   --  1.27*  LATICACIDVEN 2.4* 3.0*  --   --   --   --   GENTRANDOM  --   --   --  2.5 1.3  --     Estimated Creatinine Clearance: 72.3 mL/min (by C-G formula based on SCr of 1.27 mg/dL (H)).    Allergies  Allergen Reactions  . Rofecoxib Other (See Comments)    ulcers  . Tetracyclines & Related     Antimicrobials this admission: Vanc 1/31 >>  1/31 Zosyn 1/31 >> 1/31 Cefepime 2/1 >>2/1 Nafcillin 2/2 >> Rifampin 2/2 >> Gentamicin 2/2>>    Microbiology results: 2/2 BCx: sent  2/2 BCx: Staph species 2/1 BCx: Staph species; mec A not detected :  MSSA 2/1 UCx: sent  2/1 MRSA PCR: neg   Thank you for allowing pharmacy to be a part of this patient's care.  Myana Schlup A, PharmD 11/21/2016 1:09 PM

## 2016-11-22 ENCOUNTER — Encounter: Payer: Self-pay | Admitting: *Deleted

## 2016-11-22 LAB — GLUCOSE, CAPILLARY
GLUCOSE-CAPILLARY: 131 mg/dL — AB (ref 65–99)
Glucose-Capillary: 177 mg/dL — ABNORMAL HIGH (ref 65–99)
Glucose-Capillary: 133 mg/dL — ABNORMAL HIGH (ref 65–99)
Glucose-Capillary: 120 mg/dL — ABNORMAL HIGH (ref 65–99)

## 2016-11-22 LAB — BASIC METABOLIC PANEL
Anion gap: 7 (ref 5–15)
BUN: 15 mg/dL (ref 6–20)
CHLORIDE: 103 mmol/L (ref 101–111)
CO2: 24 mmol/L (ref 22–32)
Calcium: 8.3 mg/dL — ABNORMAL LOW (ref 8.9–10.3)
Creatinine, Ser: 0.89 mg/dL (ref 0.61–1.24)
GFR calc Af Amer: >60 mL/min (ref >60)
GFR calc non Af Amer: >60 mL/min (ref >60)
Glucose, Bld: 170 mg/dL — ABNORMAL HIGH (ref 65–99)
POTASSIUM: 3.5 mmol/L (ref 3.5–5.1)
Sodium: 134 mmol/L — ABNORMAL LOW (ref 135–145)

## 2016-11-22 LAB — PROTIME-INR
INR: 1.39
Prothrombin Time: 17.2 seconds — ABNORMAL HIGH (ref 11.4–15.2)

## 2016-11-22 LAB — GENTAMICIN LEVEL, PEAK: Gentamicin Pk: 3.3 ug/mL — ABNORMAL LOW (ref 5.0–10.0)

## 2016-11-22 LAB — GENTAMICIN LEVEL, TROUGH: Gentamicin Trough: <0.5 ug/mL — ABNORMAL LOW (ref 0.5–2.0)

## 2016-11-22 MED ORDER — METHADONE HCL 10 MG PO TABS
5.0000 mg | ORAL_TABLET | Freq: Three times a day (TID) | ORAL | Status: DC
  Administered 2016-11-22 – 2016-11-24 (×6): 5 mg via ORAL
  Filled 2016-11-22 (×6): qty 1

## 2016-11-22 NOTE — Progress Notes (Signed)
[facility]  SUBJECTIVE: I feel good   Vitals:   11/21/16 1400 11/21/16 1500 11/21/16 1941 11/22/16 0402  BP: 105/68 131/61 122/66 (!) 151/62  Pulse: (!) 104 95 93 100  Resp: (!) 24 (!) '21 18 18  '$ Temp: 98.1 F (36.7 C)  98.5 F (36.9 C) 97.8 F (36.6 C)  TempSrc:   Oral Oral  SpO2: 97% 98% 99% 100%  Weight:    123.7 kg (272 lb 11.2 oz)  Height:         Intake/Output Summary (Last 24 hours) at 11/22/16 7253 Last data filed at 11/22/16 0600  Gross per 24 hour  Intake              580 ml  Output             1700 ml  Net            -1120 ml      PHYSICAL EXAM  General: Well developed, well nourished, in no acute distress HEENT:  Normocephalic and atramatic Neck:  No JVD.  Lungs: Clear bilaterally to auscultation and percussion. Heart: HRRR . Normal S1 and S2 without gallops or murmurs.  Abdomen: Bowel sounds are positive, abdomen soft and non-tender  Msk:  Back normal, normal gait. Normal strength and tone for age. Extremities: No clubbing, cyanosis or edema.   Neuro: Alert and oriented X 3. Psych:  Good affect, responds appropriately   LABS: Basic Metabolic Panel:  Recent Labs  11/21/16 0415 11/22/16 0550  NA 134* 134*  K 3.3* 3.5  CL 104 103  CO2 25 24  GLUCOSE 138* 170*  BUN 22* 15  CREATININE 1.27* 0.89  CALCIUM 7.8* 8.3*   Liver Function Tests:  Recent Labs  11/20/16 0444  AST 41  ALT 19  ALKPHOS 57  BILITOT 1.0  PROT 6.9  ALBUMIN 3.0*   No results for input(s): LIPASE, AMYLASE in the last 72 hours. CBC:  Recent Labs  11/20/16 0444 11/21/16 0415  WBC 10.2 8.0  HGB 9.4* 9.3*  HCT 28.0* 26.9*  MCV 85.2 83.7  PLT 132* 118*   Cardiac Enzymes: No results for input(s): CKTOTAL, CKMB, CKMBINDEX, TROPONINI in the last 72 hours. BNP: Invalid input(s): POCBNP D-Dimer: No results for input(s): DDIMER in the last 72 hours. Hemoglobin A1C: No results for input(s): HGBA1C in the last 72 hours. Fasting Lipid Panel: No results for  input(s): CHOL, HDL, LDLCALC, TRIG, CHOLHDL, LDLDIRECT in the last 72 hours. Thyroid Function Tests: No results for input(s): TSH, T4TOTAL, T3FREE, THYROIDAB in the last 72 hours.  Invalid input(s): FREET3 Anemia Panel: No results for input(s): VITAMINB12, FOLATE, FERRITIN, TIBC, IRON, RETICCTPCT in the last 72 hours.  No results found.   Echo LV EF 65-70%, with small residual vegetation on mitral valve  TELEMETRY: Atrial fibrillation:  ASSESSMENT AND PLAN:  Active Problems:   Septic shock ([place])    1. 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 Status post AVR with bioprosthetic valve 3. Paroxysmal atrial fibrillation/atrial flutter on warfarin for stroke prevention and amiodarone for rhythm control  Recommendations  1. Continue current therapy 2. Continue warfarin for stroke prevention, target INR 2.0-3.0 3. Continue amiodarone for rhythm control 4. Continue the scheduled for 11/23/2016 to rule out aortic bioprosthetic valve endocarditis   Isaias Cowman, MD, PhD, [facility] 11/22/2016 9:25 AM

## 2016-11-22 NOTE — Progress Notes (Signed)
[name]    MR#:  [id number]  DATE OF BIRTH:  28-Nov-1945  SUBJECTIVE:    REVIEW OF SYSTEMS:   ROS Tolerating Diet: Tolerating PT:   DRUG ALLERGIES:   Allergies  Allergen Reactions  . Rofecoxib Other (See Comments)    ulcers  . Tetracyclines & Related     VITALS:  Blood pressure (!) 151/62, pulse 100, temperature 97.8 F (36.6 C), temperature source Oral, resp. rate 18, height '5\' 10"'$  (1.778 m), weight 123.7 kg (272 lb 11.2 oz), SpO2 100 %.  PHYSICAL EXAMINATION:   Physical Exam  GENERAL:  71 y.o.-year-old patient lying in the bed with no acute distress.  EYES: Pupils equal, round, reactive to light and accommodation. No scleral icterus. Extraocular muscles intact.  HEENT: Head atraumatic, normocephalic. Oropharynx and nasopharynx clear.  NECK:  Supple, no jugular venous distention. No thyroid enlargement, no tenderness.  LUNGS: Normal breath sounds bilaterally, no wheezing, rales, rhonchi. No use of accessory muscles of respiration.  CARDIOVASCULAR: S1, S2 normal. No murmurs, rubs, or gallops.  ABDOMEN: Soft, nontender, nondistended. Bowel sounds present. No organomegaly or mass.  EXTREMITIES: No cyanosis, clubbing or edema b/l.    NEUROLOGIC: Cranial nerves II through XII are intact. No focal Motor or sensory deficits b/l.   PSYCHIATRIC:  patient is alert and oriented x 3.  SKIN: No obvious rash, lesion, or ulcer.   LABORATORY PANEL:  CBC  Recent Labs Lab 11/21/16 0415  WBC 8.0  HGB 9.3*  HCT 26.9*  PLT 118*    Chemistries   Recent Labs Lab 11/20/16 0444  11/22/16 0550  NA 139  < > 134*  K 3.7  < > 3.5  CL 107  < > 103  CO2 24  < > 24  GLUCOSE 127*  < > 170*  BUN 30*  < > 15  CREATININE 1.44*  < > 0.89  CALCIUM 7.5*  < > 8.3*  AST 41  --   --   ALT 19  --   --   ALKPHOS 57  --   --   BILITOT 1.0  --   --   < > = values in this interval not displayed. Cardiac  Enzymes No results for input(s): TROPONINI in the last 168 hours. RADIOLOGY:  No results found. ASSESSMENT AND PLAN:  22 M s/p AVR 2012, recently completed 6 weeks cefazolin for mitral valve endocarditis and presented 2 days after completion of abx with severe sepsis, septic shock. Blood cultures again positive for MSSA. Echocardiogram c/w mitral valve vegetation and possible prosthetic aortic valve vegetation.   1. Recurrent bacterial endocarditis with Septic shock (resolved) -Cont IV abxs Naficillin,gentmicin and rifampin -ID help appreciated -repeat BC from 11/20/16 negative so fare -TEE tomorrow -will get PICC line tomorrow after discussing with ID  2.Acute hypoxic respiratory failure -resolved Wean off oxygen   3.Mild pulmonary edema pattern (vs ALI) on CXR -resolved  4.Chronic AF - rate controlled  5.AKI, nonoliguric, ATN with septic shock -resolved Creat back to normal  4.DM 2 - controlled  5.Hx of neuropathy  Case discussed with Care Management/Social Worker. Management plans discussed with the patient, family and they are in agreement.  CODE STATUS: full  DVT Prophylaxis: loveonox  TOTAL TIME TAKING CARE OF THIS PATIENT: 22mnutes.  >50% time spent on counselling and coordination of care  POSSIBLE D/C IN 1-2 DAYS, DEPENDING ON CLINICAL CONDITION.  Note: This dictation was prepared  with Dragon dictation along with smaller phrase technology. Any transcriptional errors that result from this process are unintentional.  Albena Comes M.D on 11/22/2016 at 11:40 AM  Between 7am to 6pm - Pager - [phone]  After 6pm go to [email or web address] - password [email or web address]  Office  [phone]  CC: Primary care physician; Idelle Crouch, MD

## 2016-11-22 NOTE — Progress Notes (Signed)
Patient has been alert and oriented. Ambulatory to the bathroom with walker and standby assist. Patient has been able to walk around unit some today. IV antibiotics continued. Vitals stable. Still afib on tele, rate controlled. Heart rate got as high as 140 for a short period today while patient was ambulating, but came back down to 100's. 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

## 2016-11-22 NOTE — Progress Notes (Signed)
Pharmacy Antibiotic Note  [name] is a 71 y.o. male admitted on 11/18/2016 with bacteremia and endocarditis.  Pharmacy has been consulted for cefepime dosing. MSSA bacteremia. Patient was on cefazolin outpatient with PICC line placement for MSSA endocarditis. Recently PICC line taken out on 1/29- see Dr. Ola Spurr note.   Plan: Dr. Ola Spurr recommended starting nafcillin, gentamicin, and rifampin in this patient.   1. Antibiotics:  Nafcillin 3g IV Q6h   Rifampin '300mg'$  PO Q8h  Gentamicin '90mg'$  IV Q12h.   First gentamicin trough @ 1330 today resulted at 2.5. Dose not given at 1400 today. Gentamicin trough ordered for 1730 resulted at 1.3.  Based on patient specific kinetics (ke=0.16) will administer a dose at 2200 and change patients dose and interval based on kinetics. Estimated peak 3.6 and estimated trough 0.58.  Will give gentamicin '90mg'$  IV Q12h. Check peak and trough prior to the third dose. Continue to trend renal function. Goal peak 3-4 and trough <1.   2/4:  Gent trough= <0.5  And Gent Peak= 3.3 mcg/ml.  Will continue current Gentamicin regimen of 90 mg IV q12h.  Scr has improved-follow renal fxn.    2. Drug-drug interactions with rifampin: discussed with cardiologist  - amiodarone and rifampin: decrease efficacy of amiodarone   - oral anticoagulants:    Xarelto, Eliquis, Dabigatran- category X   Warfarin- category C    Height: '5\' 10"'$  (177.8 cm) Weight: 272 lb 11.2 oz (123.7 kg) IBW/kg (Calculated) : 73  Temp (24hrs), Avg:98.2 F (36.8 C), Min:97.8 F (36.6 C), Max:98.5 F (36.9 C)   Recent Labs Lab 11/18/16 2328 11/19/16 0216 11/20/16 0444 11/20/16 1325 11/20/16 1713 11/21/16 0415 11/22/16 0550 11/22/16 0959 11/22/16 1235  WBC 12.5*  --  10.2  --   --  8.0  --   --   --   CREATININE 0.91  --  1.44*  --   --  1.27* 0.89  --   --   LATICACIDVEN 2.4* 3.0*  --   --   --   --   --   --   --   GENTTROUGH  --   --   --   --   --   --   --  <0.5*  --    GENTPEAK  --   --   --   --   --   --   --   --  3.3*  GENTRANDOM  --   --   --  2.5 1.3  --   --   --   --     Estimated Creatinine Clearance: 101.9 mL/min (by C-G formula based on SCr of 0.89 mg/dL).    Allergies  Allergen Reactions  . Rofecoxib Other (See Comments)    ulcers  . Tetracyclines & Related     Antimicrobials this admission: Vanc 1/31 >> 1/31 Zosyn 1/31 >> 1/31 Cefepime 2/1 >>2/1 Nafcillin 2/2 >> Rifampin 2/2 >> Gentamicin 2/2>>    Microbiology results: 2/2 BCx: sent  2/2 BCx: Staph species 2/1 BCx: Staph species; mec A not detected :  MSSA 2/1 UCx: sent  2/1 MRSA PCR: neg   Thank you for allowing pharmacy to be a part of this patient's care.  Erick Oxendine A, PharmD 11/22/2016 1:50 PM

## 2016-11-22 NOTE — Progress Notes (Signed)
Called lab to get results for gentamycin level. Came back at <0.5. Pharmacy called to clarify dosing and pharmacist stated okay to give current gentamycin dose because trough of <0.5 is where they want it to be. Will run antibiotic and re-time peak to be drawn after antibiotic finishes.

## 2016-11-22 NOTE — Progress Notes (Signed)
Physical Therapy Evaluation Patient Details Name: [name] MRN: [id number] DOB: [birth year] Today's Date: 11/22/2016   History of Present Illness  Patient is a 71 y.o. male admitted on 70 JAN with fever, confusion, and incontinence, diagnosed with septic shock. Relevant recent history includes being treated for MSSA endocarditis of biprosthetic valve. Was on cefazolin through PICC line for 38 days and was removed on 29 JAN. PMH includes chronic pain, CAD, DM, GERD, HLD, and HTN.  Clinical Impression  Patient is a pleasant male who prefers to be called, "[name]." Patient previously modified independent with household mobility and ADLs and working with HHPT to improve functional mobility. On evaluation, patient demonstrates modified independence with transfers and gait, ambulating 60' without oxygen. Because patient is at [facility], it is believed that he will be safe to discharge home when medically ready. He and his wife prefer to resume HHPT upon discharge to continue working towards functional mobility and prevent exacerbations in the future.    Follow Up Recommendations Home health PT    Equipment Recommendations  None recommended by PT    Recommendations for Other Services       Precautions / Restrictions Precautions Precautions: Fall Restrictions Weight Bearing Restrictions: No      Mobility  Bed Mobility                  Transfers Overall transfer level: Modified independent Equipment used: Rolling walker (2 wheeled)             General transfer comment: Patient moves from sit to stand and stand to sit with good safety awareness/sequencing.  Ambulation/Gait Ambulation/Gait assistance: Min guard Ambulation Distance (Feet): 60 Feet Assistive device: Rolling walker (2 wheeled)       General Gait Details: Patient ambulates at decreased cadence with reciprocal gait pattern, utilizing RW. Oxygen saturations >95% throughout gait assessment.  Stairs             Wheelchair Mobility    Modified Rankin (Stroke Patients Only)       Balance Overall balance assessment: History of Falls;Modified Independent                                           Pertinent Vitals/Pain Pain Assessment: Faces Faces Pain Scale: Hurts a little bit Pain Location: Feet Pain Descriptors / Indicators: Aching Pain Intervention(s): Limited activity within patient's tolerance;Monitored during session    [place] expects to be discharged to:: Private residence Living Arrangements: Spouse/significant other Available Help at Discharge: Family;Neighbor;Available PRN/intermittently Type of Home: House Home Access: Stairs to enter Entrance Stairs-Rails: Right Entrance Stairs-Number of Steps: 4 Home Layout: One level Home Equipment: Walker - 2 wheels;Cane - single point;Grab bars - toilet;Grab bars - tub/shower Additional Comments: Son recently made bathroom handicapped accessible    Prior Function Level of Independence: Needs assistance   Gait / Transfers Assistance Needed: Modified independent with AD for household distances  ADL's / Homemaking Assistance Needed: Performed by wife        Hand Dominance        Extremity/Trunk Assessment   Upper Extremity Assessment Upper Extremity Assessment: Overall WFL for tasks assessed    Lower Extremity Assessment Lower Extremity Assessment: Overall WFL for tasks assessed       Communication   Communication: No difficulties  Cognition Arousal/Alertness: Awake/alert Behavior During Therapy: WFL for tasks assessed/performed Overall Cognitive Status: Within  Functional Limits for tasks assessed                      General Comments      Exercises     Assessment/Plan    PT Assessment Patent does not need any further PT services  PT Problem List            PT Treatment Interventions      PT Goals (Current goals can be found in the Care Plan section)   Acute Rehab PT Goals Patient Stated Goal: "To go home" PT Goal Formulation: With patient/family Time For Goal Achievement: 12/06/16 Potential to Achieve Goals: Good    Frequency     Barriers to discharge        Co-evaluation               End of Session Equipment Utilized During Treatment: Gait belt Activity Tolerance: Patient tolerated treatment well Patient left: in chair;with call bell/phone within reach;with chair alarm set;with family/visitor present           Time: 8182-9937 PT Time Calculation (min) (ACUTE ONLY): 16 min   Charges:   PT Evaluation $PT Eval Low Complexity: 1 Procedure     PT G Codes:        Dorice Lamas, PT, DPT 11/22/2016, 12:12 PM

## 2016-11-23 ENCOUNTER — Inpatient Hospital Stay: Payer: PPO

## 2016-11-23 ENCOUNTER — Encounter
Admission: EM | Disposition: A | Discharge status: Home Health | Payer: Self-pay | Source: Home / Self Care | Attending: Internal Medicine

## 2016-11-23 ENCOUNTER — Inpatient Hospital Stay
Admit: 2016-11-23 | Discharge: 2016-11-23 | Disposition: A | Discharge status: Home / Self Care | Payer: PPO | Attending: Cardiology | Admitting: Cardiology

## 2016-11-23 HISTORY — PX: TEE WITHOUT CARDIOVERSION: SHX5443

## 2016-11-23 LAB — CBC
HEMATOCRIT: 29.5 % — AB (ref 40.0–52.0)
HEMOGLOBIN: 9.8 g/dL — AB (ref 13.0–18.0)
MCH: 27.9 pg (ref 26.0–34.0)
MCHC: 33.1 g/dL (ref 32.0–36.0)
MCV: 84.4 fL (ref 80.0–100.0)
Platelets: 176 10*3/uL (ref 150–440)
RBC: 3.49 MIL/uL — ABNORMAL LOW (ref 4.40–5.90)
RDW: 16.5 % — AB (ref 11.5–14.5)
WBC: 7.5 10*3/uL (ref 3.8–10.6)

## 2016-11-23 LAB — GLUCOSE, CAPILLARY
Glucose-Capillary: 156 mg/dL — ABNORMAL HIGH (ref 65–99)
Glucose-Capillary: 171 mg/dL — ABNORMAL HIGH (ref 65–99)
Glucose-Capillary: 114 mg/dL — ABNORMAL HIGH (ref 65–99)

## 2016-11-23 LAB — GENTAMICIN LEVEL, PEAK: Gentamicin Pk: 2.3 ug/mL — ABNORMAL LOW (ref 5.0–10.0)

## 2016-11-23 LAB — PROTIME-INR
INR: 1.61
Prothrombin Time: 19.3 seconds — ABNORMAL HIGH (ref 11.4–15.2)

## 2016-11-23 SURGERY — ECHOCARDIOGRAM, TRANSESOPHAGEAL
Anesthesia: Choice

## 2016-11-23 MED ORDER — SODIUM CHLORIDE 0.9 % IV SOLN
INTRAVENOUS | Status: DC
  Filled 2016-11-23: qty 250

## 2016-11-23 MED ORDER — ZOLPIDEM TARTRATE 5 MG PO TABS
5.0000 mg | ORAL_TABLET | Freq: Once | ORAL | Status: AC
  Administered 2016-11-23: 5 mg via ORAL
  Filled 2016-11-23: qty 1

## 2016-11-23 MED ORDER — FENTANYL CITRATE (PF) 100 MCG/2ML IJ SOLN
INTRAMUSCULAR | Status: AC
  Filled 2016-11-23: qty 2

## 2016-11-23 MED ORDER — BUTAMBEN-TETRACAINE-BENZOCAINE 2-2-14 % EX AERO
INHALATION_SPRAY | CUTANEOUS | Status: AC
  Filled 2016-11-23: qty 20

## 2016-11-23 MED ORDER — FENTANYL CITRATE (PF) 100 MCG/2ML IJ SOLN
INTRAMUSCULAR | Status: AC | PRN
  Administered 2016-11-23: 50 ug via INTRAVENOUS
  Administered 2016-11-23: 25 ug via INTRAVENOUS

## 2016-11-23 MED ORDER — MIDAZOLAM HCL 5 MG/5ML IJ SOLN
INTRAMUSCULAR | Status: AC
  Filled 2016-11-23: qty 5

## 2016-11-23 MED ORDER — LIDOCAINE VISCOUS 2 % MT SOLN
OROMUCOSAL | Status: AC
  Filled 2016-11-23: qty 15

## 2016-11-23 MED ORDER — MIDAZOLAM HCL 5 MG/5ML IJ SOLN
INTRAMUSCULAR | Status: AC | PRN
  Administered 2016-11-23 (×2): 2 mg via INTRAVENOUS

## 2016-11-23 MED ORDER — SODIUM CHLORIDE FLUSH 0.9 % IV SOLN
INTRAVENOUS | Status: AC
  Administered 2016-11-23: 10 mL
  Filled 2016-11-23: qty 10

## 2016-11-23 NOTE — Progress Notes (Signed)
[facility] Encounter Note  Patient: [name] / Admit Date: 11/18/2016 / Date of Encounter: 11/23/2016, 1:24 PM   Subjective: Patient is feeling much better after a weekend of antibiotics with a Staphylococcus aureus bacteremia most consistent with the previous history of recent endocarditis of apparent mitral valve vegetation and no apparent previous history of aortic valve involvement  Transesophageal echocardiogram today shows normal LV systolic function with ejection fraction of 55% with the mildly thickened mitral valve with mild right regurgitation and no evidence of vegetation. The patient does have a normal tricuspid and pulmonic valve. Bioprosthetic aortic valve shows significant extra thickening of the non-coronary cusp and a sessile vegetation likely involved with his endocarditis. There is been no evidence of significant dehiscence and/or abnormal mobility and/or significant insufficiency or structural abnormalities of the valve  Review of Systems: Positive for: [place] of breath Negative for: Vision change, hearing change, syncope, dizziness, nausea, vomiting,diarrhea, bloody stool, stomach pain, cough, congestion, diaphoresis, urinary frequency, urinary pain,skin lesions, skin rashes Others previously listed  Objective: Telemetry: Atrial fibrillation Physical Exam: Blood pressure (!) 154/85, pulse 79, temperature 97.9 F (36.6 C), temperature source Oral, resp. rate 18, height '5\' 10"'$  (1.778 m), weight 123.9 kg (273 lb 1.6 oz), SpO2 96 %. Body mass index is 39.19 kg/m. General: Well developed, well nourished, in no acute distress. Head: Normocephalic, atraumatic, sclera non-icteric, no xanthomas, nares are without discharge. Neck: No apparent masses Lungs: Normal respirations with few wheezes, no rhonchi, no rales , no crackles   Heart: Irregular rate and rhythm, normal S1 S2, 2+ aortic murmur, no rub, no gallop, PMI is normal size and placement, carotid  upstroke normal without bruit, jugular venous pressure normal Abdomen: Soft, non-tender, non-distended with normoactive bowel sounds. No hepatosplenomegaly. Abdominal aorta is normal size without bruit Extremities: No edema, no clubbing, no cyanosis, no ulcers,  Peripheral: 2+ radial, 2+ femoral, 2+ dorsal pedal pulses Neuro: Alert and oriented. Moves all extremities spontaneously. Psych:  Responds to questions appropriately with a normal affect.   Intake/Output Summary (Last 24 hours) at 11/23/16 1324 Last data filed at 11/23/16 0551  Gross per 24 hour  Intake              730 ml  Output                0 ml  Net              730 ml    Inpatient Medications:  . amiodarone  200 mg Oral Daily  . aspirin EC  81 mg Oral Daily  . butamben-tetracaine-benzocaine      . DULoxetine  60 mg Oral QHS  . enoxaparin (LOVENOX) injection  40 mg Subcutaneous Q12H  . feeding supplement (ENSURE ENLIVE)  237 mL Oral BID BM  . fentaNYL      . gabapentin  600 mg Oral TID  . small volume/piggyback builder   Intravenous Q12H  . insulin aspart  0-15 Units Subcutaneous TID WC  . insulin aspart  0-5 Units Subcutaneous QHS  . lidocaine      . methadone  5 mg Oral TID  . midazolam      . nafcillin IV  3 g Intravenous Q6H  . pantoprazole sodium  20 mg Oral QAC breakfast  . pravastatin  40 mg Oral QHS  . rifampin  300 mg Oral Q8H  . tamsulosin  0.4 mg Oral Daily  . warfarin  5 mg Oral q1800  . Warfarin -  Physician Dosing Inpatient   Does not apply q1800   Infusions:   Labs:  Recent Labs  11/21/16 0415 11/22/16 0550  NA 134* 134*  K 3.3* 3.5  CL 104 103  CO2 25 24  GLUCOSE 138* 170*  BUN 22* 15  CREATININE 1.27* 0.89  CALCIUM 7.8* 8.3*   No results for input(s): AST, ALT, ALKPHOS, BILITOT, PROT, ALBUMIN in the last 72 hours.  Recent Labs  11/21/16 0415 11/23/16 0550  WBC 8.0 7.5  HGB 9.3* 9.8*  HCT 26.9* 29.5*  MCV 83.7 84.4  PLT 118* 176   No results for input(s): CKTOTAL, CKMB,  TROPONINI in the last 72 hours. Invalid input(s): POCBNP No results for input(s): HGBA1C in the last 72 hours.   Weights: Filed Weights   11/21/16 0559 11/22/16 0402 11/23/16 0500  Weight: 126.7 kg (279 lb 5.2 oz) 123.7 kg (272 lb 11.2 oz) 123.9 kg (273 lb 1.6 oz)     Radiology/Studies:  Dg Chest Port 1 View  Result Date: 11/20/2016 CLINICAL DATA:  Respiratory failure . EXAM: PORTABLE CHEST 1 VIEW COMPARISON:  11/19/2016.  10/04/2016. FINDINGS: Left IJ line in stable position. Prior CABG. Prior cardiac valve replacement. Cardiomegaly with pulmonary vascular prominence and interstitial prominence noted consistent with CHF. No pleural effusion or pneumothorax. Prior cervical spine fusion stable in appearance from multiple prior exams. IMPRESSION: 1.  Left IJ line stable position. 2. Prior CABG. Prior cardiac valve replacement. Congestive heart failure mild pulmonary interstitial edema. Electronically Signed   By: Marcello Moores  Register   On: 11/20/2016 07:09   Dg Chest Port 1 View  Result Date: 11/19/2016 CLINICAL DATA:  Central line placement. EXAM: PORTABLE CHEST 1 VIEW COMPARISON:  Chest radiograph January 16, 2017 FINDINGS: LEFT internal jugular central venous catheter distal tip projects at proximal superior vena cava. The cardiac silhouette is similarly enlarged. Pulmonary vascular congestion without pleural effusion or focal consolidation. No pneumothorax. Soft tissue planes and included osseous structure nonsuspicious. Status post median sternotomy for cardiac valve replacement. ACDF. IMPRESSION: LEFT IJ central venous catheter distal tip projects in proximal superior vena cava. No pneumothorax. Stable cardiomegaly and pulmonary vascular congestion. Electronically Signed   By: Elon Alas M.D.   On: 11/19/2016 05:26   Dg Chest Portable 1 View  Result Date: 11/19/2016 CLINICAL DATA:  Sepsis and fever.  Confusion. EXAM: PORTABLE CHEST 1 VIEW COMPARISON:  10/04/2016 FINDINGS: A single AP portable  view of the chest demonstrates no focal airspace consolidation or alveolar edema. The lungs are grossly clear. There is no large effusion or pneumothorax. There is unchanged cardiomegaly. Cardiac and mediastinal contours are otherwise unremarkable. IMPRESSION: Mild cardiomegaly, unchanged.  No consolidation or effusion. Electronically Signed   By: Andreas Newport M.D.   On: 11/19/2016 00:19     Assessment and Recommendation  71 y.o. male with chronic non-valvular atrial fibrillation essential hypertension mixed hyperlipidemia with recurrent concerns of bacteremia likely secondary to endocarditis with a transesophageal echocardiogram showing vegetation of the non-coronary aortic cusp. 1. Continue revised medication management based on sensitivities of Staphylococcus aureus for aortic valve endocarditis 2. Continue this therapy for minimum of 6 weeks due to recurrence 3. No further cardiac intervention or surgical intervention at this time due to no evidence of dehiscence structural abnormalities and/or insufficiency of aortic valve at this time 4. Continue anticoagulation for further risk reduction in stroke with atrial fibrillation 5. Amiodarone and/or metoprolol for heart rate control of atrial fibrillation  Signed, Serafina Royals M.D. FACC

## 2016-11-23 NOTE — Care Management (Signed)
Patient is currently off the unit for TEE.  Informed by attending if TEE is negative, patient will have PICC line placed and require long term IV antibiotics.  He recently completed course of IV antibiotics and seen by ID.  Ancef was discontinued and PICC pulled.  OT consult requested by CM has been placed but not PT. Obtained this order.  CM will meet with patient when arrives back on unit to discuss discharge disposition

## 2016-11-23 NOTE — Care Management (Signed)
Confirmed and communication with [facility] that Nafcillin can be run continuous and Norva Karvonen will be administered q 12h.  PICC line to be placed around 6-7pm and anticipate discharge 2.6.2018

## 2016-11-23 NOTE — Care Management (Signed)
Patient most likely will discharge home on 2 antibiotics- one that will be given q 6 h and another bid.  Awaiting final  input from ID.  Gave heads up to [facility].

## 2016-11-23 NOTE — Progress Notes (Signed)
[facility] INFECTIOUS DISEASE PROGRESS NOTE Date of Admission:  11/18/2016     ID: [name] is a 71 y.o. male with MSSA bacteremia Active Problems:   Septic shock ([place])   Subjective: Out of unit, no fevers, feels stronger  ROS  Eleven systems are reviewed and negative except per hpi  Medications:  Antibiotics Given (last 72 hours)    Date/Time Action Medication Dose Rate   11/20/16 1700 Given   rifampin (RIFADIN) capsule 300 mg 300 mg    11/20/16 1724 Given   nafcillin 3 g in dextrose 5 % 50 mL IVPB 3 g 100 mL/hr   11/20/16 2143 Given   gentamicin (GARAMYCIN) 90 mg in dextrose 5 % 100 mL injection     11/20/16 2150 Given   rifampin (RIFADIN) capsule 300 mg 300 mg    11/21/16 0025 Given   nafcillin 3 g in dextrose 5 % 50 mL IVPB 3 g 100 mL/hr   11/21/16 0525 Given   rifampin (RIFADIN) capsule 300 mg 300 mg    11/21/16 0525 Given   nafcillin 3 g in dextrose 5 % 50 mL IVPB 3 g 100 mL/hr   11/21/16 1100 Given   nafcillin 3 g in dextrose 5 % 50 mL IVPB 3 g 100 mL/hr   11/21/16 1100 Given   gentamicin (GARAMYCIN) 90 mg in dextrose 5 % 100 mL injection     11/21/16 1441 Given   rifampin (RIFADIN) capsule 300 mg 300 mg    11/21/16 1729 Given   nafcillin 3 g in dextrose 5 % 50 mL IVPB 3 g 100 mL/hr   11/21/16 2144 Given   rifampin (RIFADIN) capsule 300 mg 300 mg    11/21/16 2144 Given   gentamicin (GARAMYCIN) 90 mg in dextrose 5 % 100 mL injection     11/22/16 0010 Given   nafcillin 3 g in dextrose 5 % 50 mL IVPB 3 g 100 mL/hr   11/22/16 0547 Given   rifampin (RIFADIN) capsule 300 mg 300 mg    11/22/16 0548 Given   nafcillin 3 g in dextrose 5 % 50 mL IVPB 3 g 100 mL/hr   11/22/16 1124 Given  [late due to trough level]   gentamicin (GARAMYCIN) 90 mg in dextrose 5 % 100 mL injection     11/22/16 1213 Given   nafcillin 3 g in dextrose 5 % 50 mL IVPB 3 g 100 mL/hr   11/22/16 1350 Given   rifampin (RIFADIN) capsule 300 mg 300 mg    11/22/16 1756 Given    nafcillin 3 g in dextrose 5 % 50 mL IVPB 3 g 100 mL/hr   11/22/16 2135 Given   rifampin (RIFADIN) capsule 300 mg 300 mg    11/22/16 2140 Given   gentamicin (GARAMYCIN) 90 mg in dextrose 5 % 100 mL injection     11/23/16 0000 Given   nafcillin 3 g in dextrose 5 % 50 mL IVPB 3 g 100 mL/hr   11/23/16 0550 Given   rifampin (RIFADIN) capsule 300 mg 300 mg    11/23/16 0551 Given   nafcillin 3 g in dextrose 5 % 50 mL IVPB 3 g 100 mL/hr   11/23/16 1028 Given   gentamicin (GARAMYCIN) 90 mg in dextrose 5 % 100 mL injection     11/23/16 1228 Given   nafcillin 3 g in dextrose 5 % 50 mL IVPB 3 g 100 mL/hr   11/23/16 1502 Given   rifampin (RIFADIN) capsule 300 mg 300 mg      .  amiodarone  200 mg Oral Daily  . aspirin EC  81 mg Oral Daily  . butamben-tetracaine-benzocaine      . DULoxetine  60 mg Oral QHS  . enoxaparin (LOVENOX) injection  40 mg Subcutaneous Q12H  . feeding supplement (ENSURE ENLIVE)  237 mL Oral BID BM  . fentaNYL      . gabapentin  600 mg Oral TID  . small volume/piggyback builder   Intravenous Q12H  . insulin aspart  0-15 Units Subcutaneous TID WC  . insulin aspart  0-5 Units Subcutaneous QHS  . lidocaine      . methadone  5 mg Oral TID  . midazolam      . nafcillin IV  3 g Intravenous Q6H  . pantoprazole sodium  20 mg Oral QAC breakfast  . pravastatin  40 mg Oral QHS  . rifampin  300 mg Oral Q8H  . tamsulosin  0.4 mg Oral Daily  . warfarin  5 mg Oral q1800  . Warfarin - Physician Dosing Inpatient   Does not apply q1800    Objective: Vital signs in last 24 hours: Temp:  [97.9 F (36.6 C)-98.7 F (37.1 C)] 97.9 F (36.6 C) (02/05 0931) Pulse Rate:  [79-103] 79 (02/05 0931) Resp:  [18-25] 18 (02/05 0931) BP: (94-154)/(49-93) 154/85 (02/05 0931) SpO2:  [96 %-100 %] 96 % (02/05 0931) Weight:  [123.9 kg (273 lb 1.6 oz)] 123.9 kg (273 lb 1.6 oz) (02/05 0500) Constitutional: on BiPap, awake, interactive HENT: anicteric Mouth/Throat: Oropharynx is clear and moist.  No oropharyngeal exudate.  Cardiovascular: Normal rate, regular rhythm and normal heart sounds.1/6 sm LLSB  Pulmonary/Chest: Effort normal and breath sounds normal. No respiratory distress. He has no wheezes.  Abdominal: Soft. Bowel sounds are normal. He exhibits no distension. There is no tenderness.  Lymphadenopathy: He has no cervical adenopathy.  Neurological: He is alert and oriented to person, place, and time.  Skin: Skin is warm and dry. No rash noted. No erythema.  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 His behavior is normal.  L Neck IJ  Lab Results  Recent Labs  11/21/16 0415 11/22/16 0550 11/23/16 0550  WBC 8.0  --  7.5  HGB 9.3*  --  9.8*  HCT 26.9*  --  29.5*  NA 134* 134*  --   K 3.3* 3.5  --   CL 104 103  --   CO2 25 24  --   BUN 22* 15  --   CREATININE 1.27* 0.89  --     Microbiology: Results for orders placed or performed during the hospital encounter of 11/18/16  Culture, blood (Routine x 2)     Status: Abnormal   Collection Time: 11/18/16 11:28 PM  Result Value Ref Range Status   Specimen Description BLOOD LEFT ANTECUBITAL  Final   Special Requests   Final    BOTTLES DRAWN AEROBIC AND ANAEROBIC  AER [place] ANA 4CC   Culture  Setup Time   Final    GRAM POSITIVE COCCI IN BOTH AEROBIC AND ANAEROBIC BOTTLES CRITICAL VALUE NOTED.  VALUE IS CONSISTENT WITH PREVIOUSLY REPORTED AND CALLED VALUE.    Culture (A)  Final    STAPHYLOCOCCUS AUREUS SUSCEPTIBILITIES PERFORMED ON PREVIOUS CULTURE WITHIN THE LAST 5 DAYS. Performed at [facility], [place] [address] [place], [place] [address]    Report Status 11/21/2016 FINAL  Final  Culture,  blood (Routine x 2)     Status: Abnormal   Collection Time: 11/18/16 11:28 PM  Result Value Ref Range Status   Specimen Description BLOOD LEFT HAND  Final   Special Requests   Final    BOTTLES DRAWN AEROBIC AND  ANAEROBIC  AER 2CC ANA [place]   Culture  Setup Time   Final    GRAM POSITIVE COCCI IN BOTH AEROBIC AND ANAEROBIC BOTTLES CRITICAL RESULT CALLED TO, READ BACK BY AND VERIFIED WITH: SHEENA HALLAJI AT 1610 ON 11/19/16 [place]. Performed at [facility], [place] [address], [place], [place] [address]    Culture STAPHYLOCOCCUS AUREUS (A)  Final   Report Status 11/21/2016 FINAL  Final   Organism ID, Bacteria STAPHYLOCOCCUS AUREUS  Final      Susceptibility   Staphylococcus aureus - MIC*    CIPROFLOXACIN <=0.5 SENSITIVE Sensitive     ERYTHROMYCIN <=0.25 SENSITIVE Sensitive     GENTAMICIN <=0.5 SENSITIVE Sensitive     OXACILLIN 0.5 SENSITIVE Sensitive     TETRACYCLINE <=1 SENSITIVE Sensitive     VANCOMYCIN 1 SENSITIVE Sensitive     TRIMETH/SULFA <=10 SENSITIVE Sensitive     CLINDAMYCIN <=0.25 SENSITIVE Sensitive     RIFAMPIN <=0.5 SENSITIVE Sensitive     Inducible Clindamycin NEGATIVE Sensitive     * STAPHYLOCOCCUS AUREUS  Urine culture     Status: Abnormal   Collection Time: 11/18/16 11:28 PM  Result Value Ref Range Status   Specimen Description URINE, RANDOM  Final   Special Requests NONE  Final   Culture (A)  Final    <10,000 COLONIES/mL INSIGNIFICANT GROWTH Performed at [facility], [place] [address] [place], [place] [address]    Report Status 11/20/2016 FINAL  Final  Blood Culture ID Panel (Reflexed)     Status: Abnormal   Collection Time: 11/18/16 11:28 PM  Result Value Ref Range Status   Enterococcus species NOT DETECTED NOT DETECTED Final   Listeria monocytogenes NOT DETECTED NOT DETECTED Final   Staphylococcus species DETECTED (A) NOT DETECTED Final    Comment: CRITICAL RESULT CALLED TO, READ BACK BY AND VERIFIED WITH: SHEENA HALLAJI AT 1914 ON 11/19/16 [place].    Staphylococcus aureus DETECTED (A) NOT DETECTED Final    Comment: Methicillin (oxacillin) susceptible Staphylococcus aureus (MSSA). Preferred therapy is anti staphylococcal beta lactam antibiotic (Cefazolin or  Nafcillin), unless clinically contraindicated. CRITICAL RESULT CALLED TO, READ BACK BY AND VERIFIED WITH: SHEENA HALLAJI AT 7829 ON 11/19/16 [place].    Methicillin resistance NOT DETECTED NOT DETECTED Final   Streptococcus species NOT DETECTED NOT DETECTED Final   Streptococcus agalactiae NOT DETECTED NOT DETECTED Final   Streptococcus pneumoniae NOT DETECTED NOT DETECTED Final   Streptococcus pyogenes NOT DETECTED NOT DETECTED Final   Acinetobacter baumannii NOT DETECTED NOT DETECTED Final   Enterobacteriaceae species NOT DETECTED NOT DETECTED Final   Enterobacter cloacae complex NOT DETECTED NOT DETECTED Final   Escherichia coli NOT DETECTED NOT DETECTED Final   Klebsiella oxytoca NOT DETECTED NOT DETECTED Final   Klebsiella pneumoniae NOT DETECTED NOT DETECTED Final   Proteus species NOT DETECTED NOT DETECTED Final   Serratia marcescens NOT DETECTED NOT DETECTED Final   Haemophilus influenzae NOT DETECTED NOT DETECTED Final   Neisseria meningitidis NOT DETECTED NOT DETECTED Final   Pseudomonas aeruginosa NOT DETECTED NOT DETECTED Final   Candida albicans NOT DETECTED NOT DETECTED Final   Candida glabrata NOT DETECTED NOT DETECTED Final   Candida krusei NOT DETECTED NOT DETECTED Final  Candida parapsilosis NOT DETECTED NOT DETECTED Final   Candida tropicalis NOT DETECTED NOT DETECTED Final  MRSA PCR Screening     Status: None   Collection Time: 11/19/16  3:50 AM  Result Value Ref Range Status   MRSA by PCR NEGATIVE NEGATIVE Final    Comment:        The GeneXpert MRSA Assay (FDA approved for NASAL specimens only), is one component of a comprehensive MRSA colonization surveillance program. It is not intended to diagnose MRSA infection nor to guide or monitor treatment for MRSA infections.   Culture, blood (Routine X 2) w Reflex to ID Panel     Status: None (Preliminary result)   Collection Time: 11/20/16  4:22 PM  Result Value Ref Range Status   Specimen Description BLOOD LEFT  ASSIST CONTROL  Final   Special Requests   Final    BOTTLES DRAWN AEROBIC AND ANAEROBIC ANA11ML AER14ML   Culture NO GROWTH 3 DAYS  Final   Report Status PENDING  Incomplete  Culture, blood (Routine X 2) w Reflex to ID Panel     Status: None (Preliminary result)   Collection Time: 11/20/16  4:28 PM  Result Value Ref Range Status   Specimen Description BLOOD RIGHT ASSIST CONTROL  Final   Special Requests   Final    BOTTLES DRAWN AEROBIC AND ANAEROBIC AER12ML ANA11ML   Culture NO GROWTH 3 DAYS  Final   Report Status PENDING  Incomplete    Studies/Results: No results found.  Assessment/Plan: Assessment:   [name] is a 71 y.o. 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  He was doing very well and I saw last week and he stopped ancef 1/29 and picc pulled 1/30.  However within 48 hours developed chills and fevers and confusion. On admit temp 103 and wbc elevated at 12.  BCX + MSSA again. 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  The knee however looks very good and he has no pain in it. He also has no other evidence of sites of residual infection so I do suspect it is the prosthetic valve. Echo still shows MV veg but TEE now shows nothing on MV but sessile veg on AV.  FU bcx 2/2 negative.   Recommendations Cont nafcillin for 6 weeks - can see if [facility] can given continuously  Will need gent for 2 weeks with close monitoring. Will continue rifampin 300 mg q 8 hours for 6 weeks Discussed with pt, wife and Dr Nehemiah Massed, Dr Posey Pronto.  Thank you very much for the consult. Will follow with you.  FITZGERALD, DAVID P   11/23/2016, 3:07 PM

## 2016-11-23 NOTE — Progress Notes (Signed)
[facility] [place] NAME: [name]    MR#:  [id number]  DATE OF BIRTH:  1946/06/13  SUBJECTIVE:  Got back from TEE. Eating lunch. Denies any complaints.  REVIEW OF SYSTEMS:   Review of Systems  Constitutional: Negative for chills, fever and weight loss.  HENT: Negative for ear discharge, ear pain and nosebleeds.   Eyes: Negative for blurred vision, pain and discharge.  Respiratory: Negative for sputum production, shortness of breath, wheezing and stridor.   Cardiovascular: Negative for chest pain, palpitations, orthopnea and PND.  Gastrointestinal: Negative for abdominal pain, diarrhea, nausea and vomiting.  Genitourinary: Negative for frequency and urgency.  Musculoskeletal: Negative for back pain and joint pain.  Neurological: Negative for sensory change, speech change, focal weakness and weakness.  Psychiatric/Behavioral: Negative for depression and hallucinations. The patient is not nervous/anxious.    Tolerating Diet:Yes Tolerating PT: Home health PT  DRUG ALLERGIES:   Allergies  Allergen Reactions  . Rofecoxib Other (See Comments)    ulcers  . Tetracyclines & Related     VITALS:  Blood pressure (!) 154/85, pulse 79, temperature 97.9 F (36.6 C), temperature source Oral, resp. rate 18, height '5\' 10"'$  (1.778 m), weight 123.9 kg (273 lb 1.6 oz), SpO2 96 %.  PHYSICAL EXAMINATION:   Physical Exam  GENERAL:  71 y.o.-year-old patient lying in the bed with no acute distress.  EYES: Pupils equal, round, reactive to light and accommodation. No scleral icterus. Extraocular muscles intact.  HEENT: Head atraumatic, normocephalic. Oropharynx and nasopharynx clear.  NECK:  Supple, no jugular venous distention. No thyroid enlargement, no tenderness.  LUNGS: Normal breath sounds bilaterally, no wheezing, rales, rhonchi. No use of accessory muscles of respiration.  CARDIOVASCULAR: S1, S2 normal. No murmurs, rubs, or gallops.   ABDOMEN: Soft, nontender, nondistended. Bowel sounds present. No organomegaly or mass.  EXTREMITIES: No cyanosis, clubbing or edema b/l.    NEUROLOGIC: Cranial nerves II through XII are intact. No focal Motor or sensory deficits b/l.   PSYCHIATRIC:  patient is alert and oriented x 3.  SKIN: No obvious rash, lesion, or ulcer.   LABORATORY PANEL:  CBC  Recent Labs Lab 11/23/16 0550  WBC 7.5  HGB 9.8*  HCT 29.5*  PLT 176    Chemistries   Recent Labs Lab 11/20/16 0444  11/22/16 0550  NA 139  < > 134*  K 3.7  < > 3.5  CL 107  < > 103  CO2 24  < > 24  GLUCOSE 127*  < > 170*  BUN 30*  < > 15  CREATININE 1.44*  < > 0.89  CALCIUM 7.5*  < > 8.3*  AST 41  --   --   ALT 19  --   --   ALKPHOS 57  --   --   BILITOT 1.0  --   --   < > = values in this interval not displayed. Cardiac Enzymes No results for input(s): TROPONINI in the last 168 hours. RADIOLOGY:  No results found. 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 Blood cultures again positive for MSSA. Echocardiogram c/w mitral valve vegetation and possible prosthetic aortic valve vegetation.   1. Recurrent bacterial endocarditis with Septic shock (resolved) -Cont IV abxs Naficillin,gentmicin and rifampin -ID help appreciated -repeat BC from 11/20/16 negative so fare -TEE Showed aortic  valve endocarditis -will get PICC line today after discussing with ID -Patient will go home on IV nafcillin, IV gentamicin and oral rifampin. 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 Management plans discussed with the patient, family and they are in agreement.  CODE STATUS: full  DVT Prophylaxis: loveonox  TOTAL TIME TAKING CARE OF THIS PATIENT: 4mnutes.  >50% time spent on counselling and coordination of care  POSSIBLE D/C IN 1-2 DAYS, DEPENDING ON CLINICAL CONDITION.  Note: This dictation was prepared with Dragon dictation along with smaller phrase technology. Any transcriptional errors that result from this process are unintentional.  Jhamir Pickup M.D on 11/23/2016 at 4:01 PM  Between 7am to 6pm - Pager - [phone]  After 6pm go to [email or web address] - password [email or web address]  Office  [phone] CC: Primary care physician; SIdelle Crouch MD

## 2016-11-23 NOTE — Care Management (Addendum)
Spoke with patient's wife.  Wants to discharge home with home health.  Agency preference is [facility] and wants "the same therapist he has last time."  Wife says she can manage the IV antibiotic therapy.  Reaching out to [facility]

## 2016-11-23 NOTE — Progress Notes (Signed)
Infectious Disease Long Term IV Antibiotic Orders  Diagnosis: PVE endocarditis  Culture results MSSA on bcx   Allergies:  Allergies  Allergen Reactions  . Rofecoxib Other (See Comments)    ulcers  . Tetracyclines & Related    Lab Results  Component Value Date   CREATININE 0.89 11/22/2016   Discharge antibiotics Nafcillin 12 gm  IV Q 24 hours administered continuously  - For six weeks from 2/2- Stop date 01/01/17  Rifampin 370m PO Q8h for 6 weeks - stop date 01/01/17  Gentamicin to be given for 2 weeks from 2/2 - STOP DATE 12/04/16 Gentamicin 938mIV Q12h. Check peak and trough prior to the third dose. Continue to trend renal function. Goal peak 3-4 and trough <1. .   PICC Care per protocol Labs weekly while on IV antibiotics      CBC w diff   Comprehensive met panel Gent peak and trough above  CRP   Planned duration of antibiotics  Stop date  See above for stop date for each abx FAX weekly labs to [id number] RamsayMD

## 2016-11-23 NOTE — Progress Notes (Signed)
SLP Cancellation Note  Patient Details Name: MARLEY CHARLOT MRN: [id number] DOB: [name] 13, 1946   Cancelled treatment:       Reason Eval/Treat Not Completed:  (chart reviewed; noted eval request. ). Will f/u w/ appropriate assessment tomorrow as indicated. Noted pt's interaction w/ PT during evaluation.    Orinda Kenner, MS, CCC-SLP Miley Blanchett 11/23/2016, 2:04 PM

## 2016-11-23 NOTE — Evaluation (Signed)
Occupational Therapy Evaluation Patient Details Name: [name] MRN: [id number] DOB: 1946-06-03 Today's Date: 11/23/2016    History of Present Illness Patient is a 71 y.o. male admitted on 29 JAN with fever, confusion, and incontinence, diagnosed with septic shock. Relevant recent history includes being treated for MSSA endocarditis of biprosthetic valve. Was on cefazolin through PICC line for 38 days and was removed on 29 JAN. PMH includes chronic pain, CAD, DM, GERD, HLD, and HTN.   Clinical Impression   Pt seen for OT evaluation this date. Pt was receiving HH PT prior to this hospitalization, reports having also had HH OT and OT with rehab for previous knee and hip surgeries in the past. Pt reports being modified independent with ADL at baseline and has adaptive equipment at home that he uses. Pt was driving up until December 2017 hospitalization but would like to get back to driving once he is better in addition to participating in wood working again, which is a hobby of his. Pt presents with pain in feet and requiring min guard during functional transfers, and decreased activity tolerance who would benefit from skilled OT services while in the hospital to address noted impairments and maximize safety/falls prevention prior to return home. Pt educated in energy conservation strategies to support falls prevention. No OT follow up recommended at this time. Pt reports feeling confident in previous skills learned and home modifications recently made to home.      Follow Up Recommendations  No OT follow up    Equipment Recommendations  None recommended by OT    Recommendations for Other Services       Precautions / Restrictions Precautions Precautions: Fall Restrictions Weight Bearing Restrictions: No      Mobility Bed Mobility               General bed mobility comments: did not assess, pt up in recliner at start of OT session  Transfers Overall transfer level: Needs  assistance Equipment used: Rolling walker (2 wheeled) Transfers: Sit to/from Stand Sit to Stand: Min guard         General transfer comment: Pt required min guard for safety during sit>stand from recliner as pt had slight posterior lean and some difficulty pulling feet back underneath him; pt educated in functional transfer training to support more stable base of support and safety    Balance Overall balance assessment: History of Falls;Modified Independent;No apparent balance deficits (not formally assessed)                                          ADL Overall ADL's : Needs assistance/impaired                     Lower Body Dressing: Set up;Sitting/lateral leans;With adaptive equipment Lower Body Dressing Details (indicate cue type and reason): pt at baseline per pt report for LB dressing tasks using A/E; declined A/E while at the hospital, has it at home Toilet Transfer: Min guard;Regular Toilet;Grab bars;RW Toilet Transfer Details (indicate cue type and reason): No physical assist provided but pt relied heavily on grab bar; pt does not have a grab bar at home           General ADL Comments: Pt requires A/E for LB dressing tasks at baseline; increased effort and heavy use of grab bar to perform toilet transfer from regular height toilet  Vision Vision Assessment?: No apparent visual deficits Additional Comments: pt has tendency to close R eye when looking at OT during session which pt is aware of and says his wife has noticed this too, denies double vision, blurriness, pain, vision difficulties   Perception     Praxis Praxis Praxis tested?: Within functional limits    Pertinent Vitals/Pain Pain Assessment: 0-10 Pain Score: 6  Pain Location: feet Pain Intervention(s): Limited activity within patient's tolerance;Monitored during session     Hand Dominance Right   Extremity/Trunk Assessment Upper Extremity Assessment Upper Extremity  Assessment: Overall WFL for tasks assessed   Lower Extremity Assessment Lower Extremity Assessment: Defer to PT evaluation       Communication Communication Communication: No difficulties   Cognition Arousal/Alertness: Awake/alert Behavior During Therapy: WFL for tasks assessed/performed Overall Cognitive Status: Within Functional Limits for tasks assessed                     General Comments       Exercises   Other Exercises Other Exercises: pt educated in energy conservation strategies and falls prevention including additional home modifications to support functional independence with ADL as well as falls prevention   Shoulder Instructions      Home Living Family/patient expects to be discharged to:: Private residence Living Arrangements: Spouse/significant other Available Help at Discharge: Family;Neighbor;Available PRN/intermittently Type of Home: House Home Access: Stairs to enter [employer] of Steps: 4 Entrance Stairs-Rails: Right Home Layout: One level     Bathroom Shower/Tub: Occupational psychologist: Handicapped height Bathroom Accessibility: Yes How Accessible: Accessible via wheelchair;Accessible via walker Home Equipment: [place] - 2 wheels;Cane - single point;Grab bars - toilet;Grab bars - tub/shower;Hand held shower head;Adaptive equipment;Shower seat Adaptive Equipment: Reacher;Sock aid;Long-handled English as a second language teacher Comments: Son recently renovated bathroom handicapped accessible      Prior Functioning/Environment Level of Independence: Needs assistance  Gait / Transfers Assistance Needed: Modified independent with AD for household distances ADL's / Homemaking Assistance Needed: Modified independent with A/E for LB dressing tasks; spouse performed household mgt/cooking/cleaning tasks   Comments: Per chart review, spouse previously reported 10+ falls within past 12 months; pt reports driving up until December when admitted  to the hospital and wants to get back to it; pt enjoys wood working         OT Problem List: Pain;Decreased activity tolerance   OT Treatment/Interventions: Self-care/ADL training;Patient/family education;Energy conservation    OT Goals(Current goals can be found in the care plan section) Acute Rehab OT Goals Patient Stated Goal: "To go home" OT Goal Formulation: With patient Time For Goal Achievement: 12/07/16 Potential to Achieve Goals: Good  OT Frequency: Min 1X/week   Barriers to D/C:            Co-evaluation              End of Session Equipment Utilized During Treatment: Gait belt;Rolling walker  Activity Tolerance: Patient tolerated treatment well Patient left: in chair;with call bell/phone within reach   Time: 1113-1149 OT Time Calculation (min): 36 min Charges:  OT General Charges $OT Visit: 1 Procedure OT Evaluation $OT Eval Low Complexity: 1 Procedure OT Treatments $Self Care/Home Management : 23-37 mins G-Codes:    Corky Sox, OTR/L 11/23/2016, 12:08 PM

## 2016-11-23 NOTE — Progress Notes (Signed)
*  PRELIMINARY RESULTS* Echocardiogram Echocardiogram Transesophageal has been performed.  [name] 11/23/2016, 8:44 AM

## 2016-11-24 ENCOUNTER — Encounter: Payer: Self-pay | Admitting: Cardiology

## 2016-11-24 DIAGNOSIS — I330 Acute and subacute infective endocarditis: Secondary | ICD-10-CM | POA: Diagnosis not present

## 2016-11-24 DIAGNOSIS — Z452 Encounter for adjustment and management of vascular access device: Secondary | ICD-10-CM | POA: Diagnosis not present

## 2016-11-24 DIAGNOSIS — Z5181 Encounter for therapeutic drug level monitoring: Secondary | ICD-10-CM | POA: Diagnosis not present

## 2016-11-24 DIAGNOSIS — T826XXA Infection and inflammatory reaction due to cardiac valve prosthesis, initial encounter: Secondary | ICD-10-CM | POA: Diagnosis not present

## 2016-11-24 DIAGNOSIS — A4101 Sepsis due to Methicillin susceptible Staphylococcus aureus: Secondary | ICD-10-CM | POA: Diagnosis not present

## 2016-11-24 DIAGNOSIS — Z7982 Long term (current) use of aspirin: Secondary | ICD-10-CM | POA: Diagnosis not present

## 2016-11-24 DIAGNOSIS — K219 Gastro-esophageal reflux disease without esophagitis: Secondary | ICD-10-CM | POA: Diagnosis not present

## 2016-11-24 DIAGNOSIS — Z792 Long term (current) use of antibiotics: Secondary | ICD-10-CM | POA: Diagnosis not present

## 2016-11-24 LAB — CREATININE, SERUM: Creatinine, Ser: 0.76 mg/dL (ref 0.61–1.24)

## 2016-11-24 LAB — PROTIME-INR
INR: 1.69
PROTHROMBIN TIME: 20.1 s — AB (ref 11.4–15.2)

## 2016-11-24 LAB — GLUCOSE, CAPILLARY
GLUCOSE-CAPILLARY: 144 mg/dL — AB (ref 65–99)
Glucose-Capillary: 161 mg/dL — ABNORMAL HIGH (ref 65–99)

## 2016-11-24 LAB — GENTAMICIN LEVEL, TROUGH

## 2016-11-24 MED ORDER — WARFARIN SODIUM 5 MG PO TABS: 5.0000 mg | ORAL_TABLET | Freq: Every day | ORAL | 0 refills | Status: DC

## 2016-11-24 MED ORDER — DEXTROSE 5 % IV SOLN: 120.0000 mg | Freq: Two times a day (BID) | INTRAVENOUS | 0 refills | Status: DC

## 2016-11-24 MED ORDER — GENTAMICIN SULFATE 40 MG/ML IJ SOLN
120.0000 mg | Freq: Two times a day (BID) | INTRAVENOUS | Status: DC
  Filled 2016-11-24 (×2): qty 3

## 2016-11-24 MED ORDER — SODIUM CHLORIDE 0.9 % IV SOLN: 12.0000 g | INTRAVENOUS | 0 refills | Status: AC

## 2016-11-24 MED ORDER — SODIUM CHLORIDE 0.9 % IV SOLN
INTRAVENOUS | Status: DC
  Filled 2016-11-24: qty 250

## 2016-11-24 MED ORDER — RIFAMPIN 300 MG PO CAPS: 300.0000 mg | ORAL_CAPSULE | Freq: Three times a day (TID) | ORAL | 1 refills | Status: AC

## 2016-11-24 NOTE — Progress Notes (Signed)
Pharmacy Antibiotic Note  [name] is a 71 y.o. male admitted on 11/18/2016 with bacteremia and endocarditis.  Pharmacy has been consulted for cefepime dosing. MSSA bacteremia. Patient was on cefazolin outpatient with PICC line placement for MSSA endocarditis. Recently PICC line taken out on 1/29- see Dr. Ola Spurr note.   Plan: Dr. Ola Spurr recommended starting nafcillin, gentamicin, and rifampin in this patient.   1. Antibiotics:  Nafcillin 3g IV Q6h   Rifampin '300mg'$  PO Q8h  Gentamicin '90mg'$  IV Q12h.   First gentamicin trough @ 1330 today resulted at 2.5. Dose not given at 1400 today. Gentamicin trough ordered for 1730 resulted at 1.3.  Based on patient specific kinetics (ke=0.16) will administer a dose at 2200 and change patients dose and interval based on kinetics. Estimated peak 3.6 and estimated trough 0.58.  Will give gentamicin '90mg'$  IV Q12h. Check peak and trough prior to the third dose. Continue to trend renal function. Goal peak 3-4 and trough <1.   2/4:  Gent trough= <0.5  And Gent Peak= 3.3 mcg/ml.  Will continue current Gentamicin regimen of 90 mg IV q12h.  Scr has improved-follow renal fxn.   2/6 Gent peak 2.3 and trough <0.5. Changing dose to 120 mg q 12 hours. Peak and trough ordered after 3rd dose of new regimen. SCr ordered with 2/6 AM labs.   2. Drug-drug interactions with rifampin: discussed with cardiologist  - amiodarone and rifampin: decrease efficacy of amiodarone   - oral anticoagulants:    Xarelto, Eliquis, Dabigatran- category X   Warfarin- category C    Height: '5\' 10"'$  (177.8 cm) Weight: 273 lb 1.6 oz (123.9 kg) IBW/kg (Calculated) : 73  Temp (24hrs), Avg:98.4 F (36.9 C), Min:97.9 F (36.6 C), Max:98.8 F (37.1 C)   Recent Labs Lab 11/18/16 2328 11/19/16 0216 11/20/16 0444 11/20/16 1325 11/20/16 1713 11/21/16 0415 11/22/16 0550 11/22/16 0959 11/22/16 1235 11/23/16 0550 11/23/16 1215 11/23/16 2319  WBC 12.5*  --  10.2  --    --  8.0  --   --   --  7.5  --   --   CREATININE 0.91  --  1.44*  --   --  1.27* 0.89  --   --   --   --   --   LATICACIDVEN 2.4* 3.0*  --   --   --   --   --   --   --   --   --   --   GENTTROUGH  --   --   --   --   --   --   --  <0.5*  --   --   --  <0.5*  GENTPEAK  --   --   --   --   --   --   --   --  3.3*  --  2.3*  --   GENTRANDOM  --   --   --  2.5 1.3  --   --   --   --   --   --   --     Estimated Creatinine Clearance: 102 mL/min (by C-G formula based on SCr of 0.89 mg/dL).    Allergies  Allergen Reactions  . Rofecoxib Other (See Comments)    ulcers  . Tetracyclines & Related     Antimicrobials this admission: Vanc 1/31 >> 1/31 Zosyn 1/31 >> 1/31 Cefepime 2/1 >>2/1 Nafcillin 2/2 >> Rifampin 2/2 >> Gentamicin 2/2>>    Microbiology results: 2/2 BCx: sent  2/2 BCx: Staph species 2/1 BCx: Staph species; mec A not detected :  MSSA 2/1 UCx: sent  2/1 MRSA PCR: neg   Thank you for allowing pharmacy to be a part of this patient's care.  Nikiyah Fackler S, PharmD 11/24/2016 4:34 AM

## 2016-11-24 NOTE — Care Management (Signed)
Patient is for discharge home today. [facility] will initiate Nafacillin infusion in the home.  Gentamycin dose will be given between 4-7pm today in the home.  The 12 noon dose will not be given in the hospital. [facility] will provide oxygen.  Patient has qualified for new home 02 for chronic respiratory failure. Patient will receive SN PT OT Aide.  Wife to transport home

## 2016-11-24 NOTE — Progress Notes (Addendum)
MD notified of O2 saturations dropping while patient ambulating.  Care manager informed as well. See previous note of 02 qualifications.

## 2016-11-24 NOTE — Discharge Summary (Addendum)
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 Chronic pain   . Coronary artery disease   . Diabetes mellitus without complication ([place])   . DJD (degenerative joint disease)   . GERD (gastroesophageal reflux disease)   . Hyperlipemia   . Hypertension   . Insomnia   . Neuropathy [facility])     HOSPITAL COURSE:   71 M s/p AVR 2012, recently completed 6 weeks cefazolin for mitral valve endocarditis and presented 2 days after completion of abx with severe sepsis, septic shock. Blood cultures again positive for MSSA. Echocardiogram c/w mitral valve vegetation and possible prosthetic aortic valve vegetation.   1. Recurrent bacterial endocarditis with Septic shock (resolved) -Cont IV abxs Naficillin,gentmicin and rifampin -ID help appreciated -repeat BC from 11/20/16 negative so far -TEE Showed aortic valve endocarditis -s/pPICC line 11/23/16  -Patient will go home on IV nafcillin, IV gentamicin and oral rifampin. [facility] home health care to make arrangements for antibiotics patient will discharge today  2.Acute hypoxic respiratory failure -resolved Wean off oxygen   3.Chronic respiratory failure  Now with Mild pulmonary edema pattern (vs ALI) on CXR -improving -pt qualifies for home oxygen  4.Chronic AF - rate controlled -now on warfarin. INR to be managed by Dr Josefa Half  5.AKI, nonoliguric, ATN with septic  shock -resolved Creat back to normal  4.DM 2 - controlled  5.Hx of neuropathy  HHPT and RN arranged D/c home today with out pt f/u with Dr Josefa Half, Dr sparks (spoke with him on the phone) and Dr fitzgerald.  CONSULTS OBTAINED:  Treatment Team:  Leonel Ramsay, MD Isaias Cowman, MD Corey Skains, MD  DRUG ALLERGIES:   Allergies  Allergen Reactions  . Rofecoxib Other (See Comments)    ulcers  . Tetracyclines & Related     DISCHARGE MEDICATIONS:   Current Discharge Medication List    START taking these medications   Details  gentamicin 120 mg in dextrose 5 % 50 mL Inject 120 mg into the vein every 12 (twelve) hours. Qty: 1 application, Refills: 0    nafcillin 12 g in sodium chloride 0.9 % 250 mL Inject 12 g into the vein continuous. Qty: 38 application, Refills: 0    rifampin (RIFADIN) 300 MG capsule Take 1 capsule (300 mg total) by mouth every 8 (eight) hours. Qty: 90 capsule, Refills: 1    warfarin (COUMADIN) 5 MG tablet Take 1 tablet (5 mg total) by mouth daily at 6 PM. Qty: 30 tablet, Refills: 0      CONTINUE these medications which have NOT CHANGED   Details  amiodarone (PACERONE) 200 MG tablet Take 1 tablet (200 mg total) by mouth daily. Qty: 30 tablet, Refills: 1    aspirin EC 81 MG tablet Take 1 tablet by mouth daily.    Cyanocobalamin (RA VITAMIN B-12 TR) 1000 MCG TBCR Take 1 tablet by mouth daily.    cyclobenzaprine (FLEXERIL) 10 MG tablet Take 1 tablet  by mouth daily.    DULoxetine (CYMBALTA) 60 MG capsule Take 1 capsule by mouth at bedtime.    furosemide (LASIX) 40 MG tablet Take 1 tablet by mouth 2 (two) times daily.    gabapentin (NEURONTIN) 600 MG tablet Take 1 tablet by mouth 3 (three) times daily.    glimepiride (AMARYL) 2 MG tablet Take 2 mg by mouth daily.    ipratropium-albuterol (DUONEB) 0.5-2.5 (3) MG/3ML SOLN Take 3 mLs by nebulization every 6 (six) hours as needed. Qty: 360 mL, Refills: 1    KLOR-CON M20 20 MEQ  tablet Take 1 tablet by mouth daily.    lisinopril (PRINIVIL,ZESTRIL) 10 MG tablet Take 1 tablet by mouth daily.    Loratadine 10 MG CAPS Take 1 capsule by mouth daily.    meclizine (ANTIVERT) 25 MG tablet Take 1 tablet by mouth 3 (three) times daily.    metFORMIN (GLUCOPHAGE) 1000 MG tablet Take 1 tablet by mouth 2 (two) times daily. With meals.    methadone (DOLOPHINE) 5 MG tablet Take 1 tablet by mouth 3 (three) times daily.    metoprolol tartrate (LOPRESSOR) 25 MG tablet Take 1 tablet by mouth 2 (two) times daily.    Multiple Vitamin (MULTI-VITAMINS) TABS Take 1 tablet by mouth daily.    omeprazole (PRILOSEC) 20 MG capsule Take 20 mg by mouth daily.    Oxcarbazepine (TRILEPTAL) 300 MG tablet Take 1 tablet by mouth 2 (two) times daily.    pravastatin (PRAVACHOL) 40 MG tablet Take 1 tablet by mouth at bedtime.    tamsulosin (FLOMAX) 0.4 MG CAPS capsule Take 1 capsule (0.4 mg total) by mouth daily. Qty: 30 capsule, Refills: 0    feeding supplement, ENSURE ENLIVE, (ENSURE ENLIVE) LIQD Take 237 mLs by mouth 2 (two) times daily between meals. Qty: 237 mL, Refills: 12    polyethylene glycol (MIRALAX / GLYCOLAX) packet Take 17 g by mouth daily. Qty: 14 each, Refills: 0      STOP taking these medications     rivaroxaban (XARELTO) 20 MG TABS tablet         If you experience worsening of your admission symptoms, develop shortness of breath, life threatening emergency, suicidal or homicidal thoughts you must seek medical attention immediately by calling 911 or calling your MD immediately  if symptoms less severe.  You Must read complete instructions/literature along with all the possible adverse reactions/side effects for all the Medicines you take and that have been prescribed to you. Take any new Medicines after you have completely understood and accept all the possible adverse reactions/side effects.   Please note  You were cared for by a hospitalist during your hospital stay.  If you have any questions about your discharge medications or the care you received while you were in the hospital after you are discharged, you can call the unit and asked to speak with the hospitalist on call if the hospitalist that took care of you is not available. Once you are discharged, your primary care physician will handle any further medical issues. Please note that NO REFILLS for any discharge medications will be authorized once you are discharged, as it is imperative that you return to your primary care physician (or establish a relationship with a primary care physician if you do not have one) for your aftercare needs so that they can reassess your need for medications and monitor your lab values. Today   SUBJECTIVE   No new complaints  VITAL SIGNS:  Blood pressure (!) 148/65, pulse  85, temperature 98.1 F (36.7 C), temperature source Oral, resp. rate 20, height '5\' 10"'$  (1.778 m), weight 122 kg (268 lb 14.4 oz), SpO2 99 %.  I/O:   Intake/Output Summary (Last 24 hours) at 11/24/16 0950 Last data filed at 11/23/16 1939  Gross per 24 hour  Intake              480 ml  Output                0 ml  Net              480 ml    PHYSICAL EXAMINATION:  GENERAL:  70 y.o.-year-old patient lying in the bed with no acute distress.  EYES: Pupils equal, round, reactive to light and accommodation. No scleral icterus. Extraocular muscles intact.  HEENT: Head atraumatic, normocephalic. Oropharynx and nasopharynx clear.  NECK:  Supple, no jugular venous distention. No thyroid enlargement, no tenderness.  LUNGS: Normal breath sounds bilaterally, no wheezing, rales,rhonchi or crepitation. No use of accessory muscles of respiration.  CARDIOVASCULAR: S1, S2 normal. No murmurs, rubs, or gallops.  ABDOMEN: Soft, non-tender, non-distended. Bowel sounds present. No organomegaly or mass.  EXTREMITIES: No pedal edema, cyanosis, or clubbing. Right arm PICC line NEUROLOGIC: Cranial nerves II through XII are  intact. Muscle strength 5/5 in all extremities. Sensation intact. Gait not checked.  PSYCHIATRIC: The patient is alert and oriented x 3.  SKIN: No obvious rash, lesion, or ulcer.   DATA REVIEW:   CBC   Recent Labs Lab 11/23/16 0550  WBC 7.5  HGB 9.8*  HCT 29.5*  PLT 176    Chemistries   Recent Labs Lab 11/20/16 0444  11/22/16 0550 11/24/16 0613  NA 139  < > 134*  --   K 3.7  < > 3.5  --   CL 107  < > 103  --   CO2 24  < > 24  --   GLUCOSE 127*  < > 170*  --   BUN 30*  < > 15  --   CREATININE 1.44*  < > 0.89 0.76  CALCIUM 7.5*  < > 8.3*  --   AST 41  --   --   --   ALT 19  --   --   --   ALKPHOS 57  --   --   --   BILITOT 1.0  --   --   --   < > = values in this interval not displayed.  Microbiology Results   Recent Results (from the past 240 hour(s))  Culture, blood (Routine x 2)     Status: Abnormal   Collection Time: 11/18/16 11:28 PM  Result Value Ref Range Status   Specimen Description BLOOD LEFT ANTECUBITAL  Final   Special Requests   Final    BOTTLES DRAWN AEROBIC AND ANAEROBIC  AER [place] ANA 4CC   Culture  Setup Time   Final    GRAM POSITIVE COCCI IN BOTH AEROBIC AND ANAEROBIC BOTTLES CRITICAL VALUE NOTED.  VALUE IS CONSISTENT WITH PREVIOUSLY REPORTED AND CALLED VALUE.    Culture (A)  Final    STAPHYLOCOCCUS AUREUS SUSCEPTIBILITIES PERFORMED ON PREVIOUS CULTURE WITHIN THE LAST 5 DAYS. Performed at [facility], [place] [address] [place], [place] [address]    Report Status 11/21/2016 FINAL  Final  Culture, blood (Routine x 2)     Status: Abnormal   Collection Time: 11/18/16 11:28 PM  Result Value Ref Range Status   Specimen  Description BLOOD LEFT HAND  Final   Special Requests   Final    BOTTLES DRAWN AEROBIC AND ANAEROBIC  AER 2CC ANA [place]   Culture  Setup Time   Final    GRAM POSITIVE COCCI IN BOTH AEROBIC AND ANAEROBIC BOTTLES CRITICAL RESULT CALLED TO, READ BACK BY AND VERIFIED WITH: SHEENA HALLAJI AT 5284 ON 11/19/16 [place]. Performed at  [facility], [place] [address] [place], [place] [address]    Culture STAPHYLOCOCCUS AUREUS (A)  Final   Report Status 11/21/2016 FINAL  Final   Organism ID, Bacteria STAPHYLOCOCCUS AUREUS  Final      Susceptibility   Staphylococcus aureus - MIC*    CIPROFLOXACIN <=0.5 SENSITIVE Sensitive     ERYTHROMYCIN <=0.25 SENSITIVE Sensitive     GENTAMICIN <=0.5 SENSITIVE Sensitive     OXACILLIN 0.5 SENSITIVE Sensitive     TETRACYCLINE <=1 SENSITIVE Sensitive     VANCOMYCIN 1 SENSITIVE Sensitive     TRIMETH/SULFA <=10 SENSITIVE Sensitive     CLINDAMYCIN <=0.25 SENSITIVE Sensitive     RIFAMPIN <=0.5 SENSITIVE Sensitive     Inducible Clindamycin NEGATIVE Sensitive     * STAPHYLOCOCCUS AUREUS  Urine culture     Status: Abnormal   Collection Time: 11/18/16 11:28 PM  Result Value Ref Range Status   Specimen Description URINE, RANDOM  Final   Special Requests NONE  Final   Culture (A)  Final    <10,000 COLONIES/mL INSIGNIFICANT GROWTH Performed at [facility], [place] [address] [place], [place] [address]    Report Status 11/20/2016 FINAL  Final  Blood Culture ID Panel (Reflexed)     Status: Abnormal   Collection Time: 11/18/16 11:28 PM  Result Value Ref Range Status   Enterococcus species NOT DETECTED NOT DETECTED Final   Listeria monocytogenes NOT DETECTED NOT DETECTED Final   Staphylococcus species DETECTED (A) NOT DETECTED Final    Comment: CRITICAL RESULT CALLED TO, READ BACK BY AND VERIFIED WITH: SHEENA HALLAJI AT 2536 ON 11/19/16 [place].    Staphylococcus aureus DETECTED (A) NOT DETECTED Final    Comment: Methicillin (oxacillin) susceptible Staphylococcus aureus (MSSA). Preferred therapy is anti staphylococcal beta lactam antibiotic (Cefazolin or Nafcillin), unless clinically contraindicated. CRITICAL RESULT CALLED TO, READ BACK BY AND VERIFIED WITH: SHEENA HALLAJI AT 6440 ON 11/19/16 [place].    Methicillin resistance NOT DETECTED NOT DETECTED Final   Streptococcus species NOT  DETECTED NOT DETECTED Final   Streptococcus agalactiae NOT DETECTED NOT DETECTED Final   Streptococcus pneumoniae NOT DETECTED NOT DETECTED Final   Streptococcus pyogenes NOT DETECTED NOT DETECTED Final   Acinetobacter baumannii NOT DETECTED NOT DETECTED Final   Enterobacteriaceae species NOT DETECTED NOT DETECTED Final   Enterobacter cloacae complex NOT DETECTED NOT DETECTED Final   Escherichia coli NOT DETECTED NOT DETECTED Final   Klebsiella oxytoca NOT DETECTED NOT DETECTED Final   Klebsiella pneumoniae NOT DETECTED NOT DETECTED Final   Proteus species NOT DETECTED NOT DETECTED Final   Serratia marcescens NOT DETECTED NOT DETECTED Final   Haemophilus influenzae NOT DETECTED NOT DETECTED Final   Neisseria meningitidis NOT DETECTED NOT DETECTED Final   Pseudomonas aeruginosa NOT DETECTED NOT DETECTED Final   Candida albicans NOT DETECTED NOT DETECTED Final   Candida glabrata NOT DETECTED NOT DETECTED Final   Candida krusei NOT DETECTED NOT DETECTED Final   Candida parapsilosis NOT DETECTED NOT DETECTED Final   Candida tropicalis NOT DETECTED NOT DETECTED Final  MRSA PCR Screening     Status: None  Collection Time: 11/19/16  3:50 AM  Result Value Ref Range Status   MRSA by PCR NEGATIVE NEGATIVE Final    Comment:        The GeneXpert MRSA Assay (FDA approved for NASAL specimens only), is one component of a comprehensive MRSA colonization surveillance program. It is not intended to diagnose MRSA infection nor to guide or monitor treatment for MRSA infections.   Culture, blood (Routine X 2) w Reflex to ID Panel     Status: None (Preliminary result)   Collection Time: 11/20/16  4:22 PM  Result Value Ref Range Status   Specimen Description BLOOD LEFT ASSIST CONTROL  Final   Special Requests   Final    BOTTLES DRAWN AEROBIC AND ANAEROBIC ANA11ML AER14ML   Culture NO GROWTH 4 DAYS  Final   Report Status PENDING  Incomplete  Culture, blood (Routine X 2) w Reflex to ID Panel      Status: None (Preliminary result)   Collection Time: 11/20/16  4:28 PM  Result Value Ref Range Status   Specimen Description BLOOD RIGHT ASSIST CONTROL  Final   Special Requests   Final    BOTTLES DRAWN AEROBIC AND ANAEROBIC AER12ML ANA11ML   Culture NO GROWTH 4 DAYS  Final   Report Status PENDING  Incomplete    RADIOLOGY:  Dg Chest 1 View  Result Date: 11/23/2016 CLINICAL DATA:  PICC line placement EXAM: CHEST 1 VIEW COMPARISON:  11/20/2016 CXR, 10/02/2016 CT FINDINGS: New right-sided PICC line tip is seen in the distal SVC. Unchanged left IJ central line catheter in the expected location of the distal left brachiocephalic vein. Heart is enlarged but stable. There is aortic atherosclerosis. Median sternotomy sutures are in place with aortic valvular replacement. Chronic stable mild interstitial edema. No pneumonic consolidation or pneumothorax ACDF of the cervical spine. IMPRESSION: Mild interstitial edema with stable cardiomegaly and aortic atherosclerosis. PICC line tip in the distal SVC. Left IJ line is unchanged in appearance with tip in the region of the distal left brachiocephalic vein. Electronically Signed   By: Ashley Royalty M.D.   On: 11/23/2016 20:40     Management plans discussed with the patient, family and they are in agreement.  CODE STATUS:     Code Status Orders        Start     Ordered   11/19/16 0202  Full code  Continuous     11/19/16 0202    Code Status History    Date Active Date Inactive Code Status Order ID Comments User Context   10/02/2016 11:39 AM 10/10/2016  8:15 PM Full Code [id number]  Laverle Hobby, MD ED      TOTAL TIME TAKING CARE OF THIS PATIENT: 40 minutes.    Raidyn Wassink M.D on 11/24/2016 at 9:50 AM  Between 7am to 6pm - Pager - [phone] After 6pm go to [email or web address] - password [email or web address]  Office  [phone]  CC: Primary care physician; Idelle Crouch, MD

## 2016-11-24 NOTE — Progress Notes (Signed)
Discharges instructions given to patient and wife. Verbalized understanding. Oxygen delivered and at bedside. PICC line remains in place for home IV therapies. Wife at bedside and will be transporting patient home. No further questions at this time.

## 2016-11-24 NOTE — Progress Notes (Signed)
SATURATION QUALIFICATIONS: (This note is used to comply with regulatory documentation for home oxygen)  Patient Saturations on Room Air at Rest = 96%  Patient Saturations on Room Air while Ambulating = 88%  Patient Saturations on 2 Liters of oxygen while Ambulating = 92%  Please briefly explain why patient needs home oxygen:

## 2016-11-24 NOTE — Progress Notes (Signed)
SLP Cancellation Note  Patient Details Name: [name] MRN: [id number] DOB: 1946-05-24   Cancelled treatment:       Reason Eval/Treat Not Completed: SLP screened, no needs identified, will sign off (chart reviewed; NSG consulted; met w/ patient). Discussed w/ pt any issues regarding speech-language and swallowing. Pt denied any difficulty w/ his speech - he conversed at conversational level w/ SLP noting names of staff providing his care. He denied any swallowing issues stating he is eating/drinking at his meals "well". He endorsed difficulty swallowing potassium pill(medication) stating he cut it in half to "swallow it better". Gave information on using a TSP of Applesauce or Pudding to better coat the pill(especially when cutting it) to create cohesiveness for swallowing more easily. Pt agreed stating he would try it.  NSG to reconsult if any change in status while still admitted. No further skilled services indicated at this time. Pt agreed.   Orinda Kenner, MS, CCC-SLP Watson,Katherine 11/24/2016, 12:36 PM

## 2016-11-24 NOTE — Discharge Instructions (Signed)
PICC care per protocol

## 2016-11-24 NOTE — Progress Notes (Signed)
[facility] Cardiology  SUBJECTIVE: Feels good today, no chest pain, no dyspnea.   Vitals:   11/23/16 0931 11/24/16 0433 11/24/16 0500 11/24/16 0748  BP: (!) 154/85 (!) 152/73  (!) 148/65  Pulse: 79 (!) 112  85  Resp: 18   20  Temp: 97.9 F (36.6 C) 98.8 F (37.1 C)  98.1 F (36.7 C)  TempSrc: Oral Oral  Oral  SpO2: 96% (!) 85%  99%  Weight:   268 lb 14.4 oz (122 kg)   Height:         Intake/Output Summary (Last 24 hours) at 11/24/16 0754 Last data filed at 11/23/16 1939  Gross per 24 hour  Intake              480 ml  Output                0 ml  Net              480 ml      PHYSICAL EXAM  General: Well developed, well nourished, in no acute distress HEENT:  Normocephalic and atramatic Neck:  No JVD.  Lungs: Clear bilaterally to auscultation and percussion. Heart: HRRR . Normal S1 and S2 without gallops or murmurs.  Abdomen: Bowel sounds are positive, abdomen soft and non-tender  Msk:  Back normal, normal gait. Normal strength and tone for age. Extremities: No clubbing, cyanosis or edema.   Neuro: Alert and oriented X 3. Psych:  Good affect, responds appropriately   LABS: Basic Metabolic Panel:  Recent Labs  11/22/16 0550  NA 134*  K 3.5  CL 103  CO2 24  GLUCOSE 170*  BUN 15  CREATININE 0.89  CALCIUM 8.3*   Liver Function Tests: No results for input(s): AST, ALT, ALKPHOS, BILITOT, PROT, ALBUMIN in the last 72 hours. No results for input(s): LIPASE, AMYLASE in the last 72 hours. CBC:  Recent Labs  11/23/16 0550  WBC 7.5  HGB 9.8*  HCT 29.5*  MCV 84.4  PLT 176   Cardiac Enzymes: No results for input(s): CKTOTAL, CKMB, CKMBINDEX, TROPONINI in the last 72 hours. BNP: Invalid input(s): POCBNP D-Dimer: No results for input(s): DDIMER in the last 72 hours. Hemoglobin A1C: No results for input(s): HGBA1C in the last 72 hours. Fasting Lipid Panel: No results for input(s): CHOL, HDL, LDLCALC, TRIG, CHOLHDL, LDLDIRECT in the last 72 hours. Thyroid  Function Tests: No results for input(s): TSH, T4TOTAL, T3FREE, THYROIDAB in the last 72 hours.  Invalid input(s): FREET3 Anemia Panel: No results for input(s): VITAMINB12, FOLATE, FERRITIN, TIBC, IRON, RETICCTPCT in the last 72 hours.  Dg Chest 1 View  Result Date: 11/23/2016 CLINICAL DATA:  PICC line placement EXAM: CHEST 1 VIEW COMPARISON:  11/20/2016 CXR, 10/02/2016 CT FINDINGS: New right-sided PICC line tip is seen in the distal SVC. Unchanged left IJ central line catheter in the expected location of the distal left brachiocephalic vein. Heart is enlarged but stable. There is aortic atherosclerosis. Median sternotomy sutures are in place with aortic valvular replacement. Chronic stable mild interstitial edema. No pneumonic consolidation or pneumothorax ACDF of the cervical spine. IMPRESSION: Mild interstitial edema with stable cardiomegaly and aortic atherosclerosis. PICC line tip in the distal SVC. Left IJ line is unchanged in appearance with tip in the region of the distal left brachiocephalic vein. Electronically Signed   By: Ashley Royalty M.D.   On: 11/23/2016 20:40     Echo Aortic valve endocarditis  TELEMETRY: Afib  ASSESSMENT AND PLAN:  Active Problems:  Septic shock ([place])   1. Recurrent bacteremia/sepsis due to aortic valve MSSA endocarditis, previously treated for MSSA endocarditis with Ancef. Clinically stable, no significant regurgitation. 2. 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 Paroxysmal atrial fibrillation/atrial flutter on warfarin for stroke prevention and amiodarone for rhythm control  Recommendations  1. Continue current therapy with Nafcillin, Rifampin and Gentamicin 2. Continue warfarin for stroke prevention, target INR 2.0-3.0 3. Continue amiodarone for rhythm control  Will sign off for now, please call with any questions.  The patient was seen under the supervision of Dr. Saralyn Pilar.   Paige Horcher, PA-S 11/24/2016 7:54  AM

## 2016-11-24 NOTE — Care Management Important Message (Signed)
Important Message  Patient Details  Name: [name] MRN: [id number] Date of Birth: 12/15/1945   Medicare Important Message Given:  Yes  Initial signed IM printed from Epic and given to patient.     Katrina Stack, RN 11/24/2016, 1:40 PM

## 2016-11-25 LAB — CULTURE, BLOOD (ROUTINE X 2)
CULTURE: NO GROWTH
Culture: NO GROWTH

## 2016-11-26 DIAGNOSIS — Z792 Long term (current) use of antibiotics: Secondary | ICD-10-CM | POA: Diagnosis not present

## 2016-11-26 DIAGNOSIS — I330 Acute and subacute infective endocarditis: Secondary | ICD-10-CM | POA: Diagnosis not present

## 2016-11-26 DIAGNOSIS — Z5181 Encounter for therapeutic drug level monitoring: Secondary | ICD-10-CM | POA: Diagnosis not present

## 2016-11-26 DIAGNOSIS — Z7982 Long term (current) use of aspirin: Secondary | ICD-10-CM | POA: Diagnosis not present

## 2016-11-26 DIAGNOSIS — A4101 Sepsis due to Methicillin susceptible Staphylococcus aureus: Secondary | ICD-10-CM | POA: Diagnosis not present

## 2016-11-26 DIAGNOSIS — K219 Gastro-esophageal reflux disease without esophagitis: Secondary | ICD-10-CM | POA: Diagnosis not present

## 2016-11-26 DIAGNOSIS — T826XXA Infection and inflammatory reaction due to cardiac valve prosthesis, initial encounter: Secondary | ICD-10-CM | POA: Diagnosis not present

## 2016-11-26 DIAGNOSIS — Z452 Encounter for adjustment and management of vascular access device: Secondary | ICD-10-CM | POA: Diagnosis not present

## 2016-11-27 DIAGNOSIS — J189 Pneumonia, unspecified organism: Secondary | ICD-10-CM | POA: Diagnosis not present

## 2016-11-27 DIAGNOSIS — Z7901 Long term (current) use of anticoagulants: Secondary | ICD-10-CM | POA: Diagnosis not present

## 2016-11-27 DIAGNOSIS — E1140 Type 2 diabetes mellitus with diabetic neuropathy, unspecified: Secondary | ICD-10-CM | POA: Diagnosis not present

## 2016-11-27 DIAGNOSIS — E785 Hyperlipidemia, unspecified: Secondary | ICD-10-CM | POA: Diagnosis not present

## 2016-11-27 DIAGNOSIS — Z7984 Long term (current) use of oral hypoglycemic drugs: Secondary | ICD-10-CM | POA: Diagnosis not present

## 2016-11-27 DIAGNOSIS — I2510 Atherosclerotic heart disease of native coronary artery without angina pectoris: Secondary | ICD-10-CM | POA: Diagnosis not present

## 2016-11-27 DIAGNOSIS — Z452 Encounter for adjustment and management of vascular access device: Secondary | ICD-10-CM | POA: Diagnosis not present

## 2016-11-27 DIAGNOSIS — G894 Chronic pain syndrome: Secondary | ICD-10-CM | POA: Diagnosis not present

## 2016-11-27 DIAGNOSIS — Z5181 Encounter for therapeutic drug level monitoring: Secondary | ICD-10-CM | POA: Diagnosis not present

## 2016-11-27 DIAGNOSIS — I330 Acute and subacute infective endocarditis: Secondary | ICD-10-CM | POA: Diagnosis not present

## 2016-11-27 DIAGNOSIS — K219 Gastro-esophageal reflux disease without esophagitis: Secondary | ICD-10-CM | POA: Diagnosis not present

## 2016-11-27 DIAGNOSIS — R339 Retention of urine, unspecified: Secondary | ICD-10-CM | POA: Diagnosis not present

## 2016-11-27 DIAGNOSIS — T826XXA Infection and inflammatory reaction due to cardiac valve prosthesis, initial encounter: Secondary | ICD-10-CM | POA: Diagnosis not present

## 2016-11-27 DIAGNOSIS — A4101 Sepsis due to Methicillin susceptible Staphylococcus aureus: Secondary | ICD-10-CM | POA: Diagnosis not present

## 2016-11-27 DIAGNOSIS — I4891 Unspecified atrial fibrillation: Secondary | ICD-10-CM | POA: Diagnosis not present

## 2016-11-27 DIAGNOSIS — Z7982 Long term (current) use of aspirin: Secondary | ICD-10-CM | POA: Diagnosis not present

## 2016-11-27 DIAGNOSIS — Z792 Long term (current) use of antibiotics: Secondary | ICD-10-CM | POA: Diagnosis not present

## 2016-11-30 DIAGNOSIS — I2510 Atherosclerotic heart disease of native coronary artery without angina pectoris: Secondary | ICD-10-CM | POA: Diagnosis not present

## 2016-11-30 DIAGNOSIS — Z7901 Long term (current) use of anticoagulants: Secondary | ICD-10-CM | POA: Diagnosis not present

## 2016-11-30 DIAGNOSIS — K219 Gastro-esophageal reflux disease without esophagitis: Secondary | ICD-10-CM | POA: Diagnosis not present

## 2016-11-30 DIAGNOSIS — J189 Pneumonia, unspecified organism: Secondary | ICD-10-CM | POA: Diagnosis not present

## 2016-11-30 DIAGNOSIS — E1140 Type 2 diabetes mellitus with diabetic neuropathy, unspecified: Secondary | ICD-10-CM | POA: Diagnosis not present

## 2016-11-30 DIAGNOSIS — Z7984 Long term (current) use of oral hypoglycemic drugs: Secondary | ICD-10-CM | POA: Diagnosis not present

## 2016-11-30 DIAGNOSIS — Z452 Encounter for adjustment and management of vascular access device: Secondary | ICD-10-CM | POA: Diagnosis not present

## 2016-11-30 DIAGNOSIS — T826XXA Infection and inflammatory reaction due to cardiac valve prosthesis, initial encounter: Secondary | ICD-10-CM | POA: Diagnosis not present

## 2016-11-30 DIAGNOSIS — R339 Retention of urine, unspecified: Secondary | ICD-10-CM | POA: Diagnosis not present

## 2016-11-30 DIAGNOSIS — I330 Acute and subacute infective endocarditis: Secondary | ICD-10-CM | POA: Diagnosis not present

## 2016-11-30 DIAGNOSIS — E785 Hyperlipidemia, unspecified: Secondary | ICD-10-CM | POA: Diagnosis not present

## 2016-11-30 DIAGNOSIS — Z5181 Encounter for therapeutic drug level monitoring: Secondary | ICD-10-CM | POA: Diagnosis not present

## 2016-11-30 DIAGNOSIS — A4101 Sepsis due to Methicillin susceptible Staphylococcus aureus: Secondary | ICD-10-CM | POA: Diagnosis not present

## 2016-11-30 DIAGNOSIS — Z792 Long term (current) use of antibiotics: Secondary | ICD-10-CM | POA: Diagnosis not present

## 2016-11-30 DIAGNOSIS — I4891 Unspecified atrial fibrillation: Secondary | ICD-10-CM | POA: Diagnosis not present

## 2016-11-30 DIAGNOSIS — G894 Chronic pain syndrome: Secondary | ICD-10-CM | POA: Diagnosis not present

## 2016-11-30 DIAGNOSIS — Z7982 Long term (current) use of aspirin: Secondary | ICD-10-CM | POA: Diagnosis not present

## 2016-12-01 DIAGNOSIS — Z792 Long term (current) use of antibiotics: Secondary | ICD-10-CM | POA: Diagnosis not present

## 2016-12-01 DIAGNOSIS — I330 Acute and subacute infective endocarditis: Secondary | ICD-10-CM | POA: Diagnosis not present

## 2016-12-01 DIAGNOSIS — K219 Gastro-esophageal reflux disease without esophagitis: Secondary | ICD-10-CM | POA: Diagnosis not present

## 2016-12-01 DIAGNOSIS — Z7982 Long term (current) use of aspirin: Secondary | ICD-10-CM | POA: Diagnosis not present

## 2016-12-01 DIAGNOSIS — Z452 Encounter for adjustment and management of vascular access device: Secondary | ICD-10-CM | POA: Diagnosis not present

## 2016-12-01 DIAGNOSIS — Z5181 Encounter for therapeutic drug level monitoring: Secondary | ICD-10-CM | POA: Diagnosis not present

## 2016-12-01 DIAGNOSIS — A4101 Sepsis due to Methicillin susceptible Staphylococcus aureus: Secondary | ICD-10-CM | POA: Diagnosis not present

## 2016-12-02 DIAGNOSIS — I330 Acute and subacute infective endocarditis: Secondary | ICD-10-CM | POA: Insufficient documentation

## 2016-12-02 DIAGNOSIS — I38 Endocarditis, valve unspecified: Secondary | ICD-10-CM | POA: Insufficient documentation

## 2016-12-02 DIAGNOSIS — Z792 Long term (current) use of antibiotics: Secondary | ICD-10-CM | POA: Diagnosis not present

## 2016-12-02 DIAGNOSIS — I058 Other rheumatic mitral valve diseases: Secondary | ICD-10-CM | POA: Diagnosis not present

## 2016-12-02 DIAGNOSIS — T826XXD Infection and inflammatory reaction due to cardiac valve prosthesis, subsequent encounter: Secondary | ICD-10-CM | POA: Diagnosis not present

## 2016-12-07 DIAGNOSIS — I2510 Atherosclerotic heart disease of native coronary artery without angina pectoris: Secondary | ICD-10-CM | POA: Diagnosis not present

## 2016-12-07 DIAGNOSIS — T826XXA Infection and inflammatory reaction due to cardiac valve prosthesis, initial encounter: Secondary | ICD-10-CM | POA: Diagnosis not present

## 2016-12-07 DIAGNOSIS — Z7901 Long term (current) use of anticoagulants: Secondary | ICD-10-CM | POA: Diagnosis not present

## 2016-12-07 DIAGNOSIS — E785 Hyperlipidemia, unspecified: Secondary | ICD-10-CM | POA: Diagnosis not present

## 2016-12-07 DIAGNOSIS — I4891 Unspecified atrial fibrillation: Secondary | ICD-10-CM | POA: Diagnosis not present

## 2016-12-07 DIAGNOSIS — G894 Chronic pain syndrome: Secondary | ICD-10-CM | POA: Diagnosis not present

## 2016-12-07 DIAGNOSIS — Z792 Long term (current) use of antibiotics: Secondary | ICD-10-CM | POA: Diagnosis not present

## 2016-12-07 DIAGNOSIS — K219 Gastro-esophageal reflux disease without esophagitis: Secondary | ICD-10-CM | POA: Diagnosis not present

## 2016-12-07 DIAGNOSIS — Z5181 Encounter for therapeutic drug level monitoring: Secondary | ICD-10-CM | POA: Diagnosis not present

## 2016-12-07 DIAGNOSIS — Z7982 Long term (current) use of aspirin: Secondary | ICD-10-CM | POA: Diagnosis not present

## 2016-12-07 DIAGNOSIS — I330 Acute and subacute infective endocarditis: Secondary | ICD-10-CM | POA: Diagnosis not present

## 2016-12-07 DIAGNOSIS — R339 Retention of urine, unspecified: Secondary | ICD-10-CM | POA: Diagnosis not present

## 2016-12-07 DIAGNOSIS — A4101 Sepsis due to Methicillin susceptible Staphylococcus aureus: Secondary | ICD-10-CM | POA: Diagnosis not present

## 2016-12-07 DIAGNOSIS — Z7984 Long term (current) use of oral hypoglycemic drugs: Secondary | ICD-10-CM | POA: Diagnosis not present

## 2016-12-07 DIAGNOSIS — Z452 Encounter for adjustment and management of vascular access device: Secondary | ICD-10-CM | POA: Diagnosis not present

## 2016-12-07 DIAGNOSIS — J189 Pneumonia, unspecified organism: Secondary | ICD-10-CM | POA: Diagnosis not present

## 2016-12-07 DIAGNOSIS — E1140 Type 2 diabetes mellitus with diabetic neuropathy, unspecified: Secondary | ICD-10-CM | POA: Diagnosis not present

## 2016-12-08 DIAGNOSIS — A4101 Sepsis due to Methicillin susceptible Staphylococcus aureus: Secondary | ICD-10-CM | POA: Diagnosis not present

## 2016-12-08 DIAGNOSIS — I330 Acute and subacute infective endocarditis: Secondary | ICD-10-CM | POA: Diagnosis not present

## 2016-12-08 DIAGNOSIS — Z792 Long term (current) use of antibiotics: Secondary | ICD-10-CM | POA: Diagnosis not present

## 2016-12-08 DIAGNOSIS — K219 Gastro-esophageal reflux disease without esophagitis: Secondary | ICD-10-CM | POA: Diagnosis not present

## 2016-12-08 DIAGNOSIS — Z452 Encounter for adjustment and management of vascular access device: Secondary | ICD-10-CM | POA: Diagnosis not present

## 2016-12-08 DIAGNOSIS — Z7982 Long term (current) use of aspirin: Secondary | ICD-10-CM | POA: Diagnosis not present

## 2016-12-08 DIAGNOSIS — Z5181 Encounter for therapeutic drug level monitoring: Secondary | ICD-10-CM | POA: Diagnosis not present

## 2016-12-09 DIAGNOSIS — A4101 Sepsis due to Methicillin susceptible Staphylococcus aureus: Secondary | ICD-10-CM | POA: Diagnosis not present

## 2016-12-09 DIAGNOSIS — E785 Hyperlipidemia, unspecified: Secondary | ICD-10-CM | POA: Diagnosis not present

## 2016-12-09 DIAGNOSIS — I4891 Unspecified atrial fibrillation: Secondary | ICD-10-CM | POA: Diagnosis not present

## 2016-12-09 DIAGNOSIS — R339 Retention of urine, unspecified: Secondary | ICD-10-CM | POA: Diagnosis not present

## 2016-12-09 DIAGNOSIS — Z7984 Long term (current) use of oral hypoglycemic drugs: Secondary | ICD-10-CM | POA: Diagnosis not present

## 2016-12-09 DIAGNOSIS — Z5181 Encounter for therapeutic drug level monitoring: Secondary | ICD-10-CM | POA: Diagnosis not present

## 2016-12-09 DIAGNOSIS — Z7901 Long term (current) use of anticoagulants: Secondary | ICD-10-CM | POA: Diagnosis not present

## 2016-12-09 DIAGNOSIS — G894 Chronic pain syndrome: Secondary | ICD-10-CM | POA: Diagnosis not present

## 2016-12-09 DIAGNOSIS — K219 Gastro-esophageal reflux disease without esophagitis: Secondary | ICD-10-CM | POA: Diagnosis not present

## 2016-12-09 DIAGNOSIS — M47896 Other spondylosis, lumbar region: Secondary | ICD-10-CM | POA: Diagnosis not present

## 2016-12-09 DIAGNOSIS — J189 Pneumonia, unspecified organism: Secondary | ICD-10-CM | POA: Diagnosis not present

## 2016-12-09 DIAGNOSIS — I2510 Atherosclerotic heart disease of native coronary artery without angina pectoris: Secondary | ICD-10-CM | POA: Diagnosis not present

## 2016-12-09 DIAGNOSIS — E1140 Type 2 diabetes mellitus with diabetic neuropathy, unspecified: Secondary | ICD-10-CM | POA: Diagnosis not present

## 2016-12-09 DIAGNOSIS — Z792 Long term (current) use of antibiotics: Secondary | ICD-10-CM | POA: Diagnosis not present

## 2016-12-09 DIAGNOSIS — Z452 Encounter for adjustment and management of vascular access device: Secondary | ICD-10-CM | POA: Diagnosis not present

## 2016-12-09 DIAGNOSIS — Z7982 Long term (current) use of aspirin: Secondary | ICD-10-CM | POA: Diagnosis not present

## 2016-12-11 ENCOUNTER — Ambulatory Visit: Payer: PPO

## 2016-12-14 DIAGNOSIS — I330 Acute and subacute infective endocarditis: Secondary | ICD-10-CM | POA: Diagnosis not present

## 2016-12-14 DIAGNOSIS — Z5181 Encounter for therapeutic drug level monitoring: Secondary | ICD-10-CM | POA: Diagnosis not present

## 2016-12-14 DIAGNOSIS — A4101 Sepsis due to Methicillin susceptible Staphylococcus aureus: Secondary | ICD-10-CM | POA: Diagnosis not present

## 2016-12-14 DIAGNOSIS — Z792 Long term (current) use of antibiotics: Secondary | ICD-10-CM | POA: Diagnosis not present

## 2016-12-14 DIAGNOSIS — Z7982 Long term (current) use of aspirin: Secondary | ICD-10-CM | POA: Diagnosis not present

## 2016-12-14 DIAGNOSIS — T826XXA Infection and inflammatory reaction due to cardiac valve prosthesis, initial encounter: Secondary | ICD-10-CM | POA: Diagnosis not present

## 2016-12-14 DIAGNOSIS — Z452 Encounter for adjustment and management of vascular access device: Secondary | ICD-10-CM | POA: Diagnosis not present

## 2016-12-14 DIAGNOSIS — K219 Gastro-esophageal reflux disease without esophagitis: Secondary | ICD-10-CM | POA: Diagnosis not present

## 2016-12-15 ENCOUNTER — Observation Stay: Payer: PPO

## 2016-12-15 ENCOUNTER — Emergency Department: Payer: PPO

## 2016-12-15 ENCOUNTER — Inpatient Hospital Stay
Admission: EM | Admit: 2016-12-15 | Discharge: 2016-12-18 | DRG: 064 | Disposition: A | Discharge status: Home Health | Payer: PPO | Attending: Specialist | Admitting: Specialist

## 2016-12-15 DIAGNOSIS — R791 Abnormal coagulation profile: Secondary | ICD-10-CM | POA: Diagnosis not present

## 2016-12-15 DIAGNOSIS — Z7984 Long term (current) use of oral hypoglycemic drugs: Secondary | ICD-10-CM | POA: Diagnosis not present

## 2016-12-15 DIAGNOSIS — I330 Acute and subacute infective endocarditis: Secondary | ICD-10-CM | POA: Diagnosis not present

## 2016-12-15 DIAGNOSIS — R509 Fever, unspecified: Secondary | ICD-10-CM | POA: Diagnosis not present

## 2016-12-15 DIAGNOSIS — R29700 NIHSS score 0: Secondary | ICD-10-CM | POA: Diagnosis present

## 2016-12-15 DIAGNOSIS — I482 Chronic atrial fibrillation: Secondary | ICD-10-CM | POA: Diagnosis not present

## 2016-12-15 DIAGNOSIS — Z8249 Family history of ischemic heart disease and other diseases of the circulatory system: Secondary | ICD-10-CM | POA: Diagnosis not present

## 2016-12-15 DIAGNOSIS — E1140 Type 2 diabetes mellitus with diabetic neuropathy, unspecified: Secondary | ICD-10-CM | POA: Diagnosis not present

## 2016-12-15 DIAGNOSIS — T826XXD Infection and inflammatory reaction due to cardiac valve prosthesis, subsequent encounter: Secondary | ICD-10-CM

## 2016-12-15 DIAGNOSIS — R531 Weakness: Secondary | ICD-10-CM | POA: Diagnosis not present

## 2016-12-15 DIAGNOSIS — Y831 Surgical operation with implant of artificial internal device as the cause of abnormal reaction of the patient, or of later complication, without mention of misadventure at the time of the procedure: Secondary | ICD-10-CM | POA: Diagnosis present

## 2016-12-15 DIAGNOSIS — Z7982 Long term (current) use of aspirin: Secondary | ICD-10-CM

## 2016-12-15 DIAGNOSIS — I639 Cerebral infarction, unspecified: Secondary | ICD-10-CM | POA: Diagnosis not present

## 2016-12-15 DIAGNOSIS — I38 Endocarditis, valve unspecified: Secondary | ICD-10-CM | POA: Diagnosis not present

## 2016-12-15 DIAGNOSIS — G8929 Other chronic pain: Secondary | ICD-10-CM | POA: Diagnosis present

## 2016-12-15 DIAGNOSIS — G8194 Hemiplegia, unspecified affecting left nondominant side: Secondary | ICD-10-CM | POA: Diagnosis not present

## 2016-12-15 DIAGNOSIS — Z79899 Other long term (current) drug therapy: Secondary | ICD-10-CM | POA: Diagnosis not present

## 2016-12-15 DIAGNOSIS — Z825 Family history of asthma and other chronic lower respiratory diseases: Secondary | ICD-10-CM | POA: Diagnosis not present

## 2016-12-15 DIAGNOSIS — I4891 Unspecified atrial fibrillation: Secondary | ICD-10-CM | POA: Diagnosis not present

## 2016-12-15 DIAGNOSIS — I10 Essential (primary) hypertension: Secondary | ICD-10-CM | POA: Diagnosis not present

## 2016-12-15 DIAGNOSIS — R4781 Slurred speech: Secondary | ICD-10-CM | POA: Diagnosis not present

## 2016-12-15 DIAGNOSIS — M1990 Unspecified osteoarthritis, unspecified site: Secondary | ICD-10-CM | POA: Diagnosis present

## 2016-12-15 DIAGNOSIS — I679 Cerebrovascular disease, unspecified: Secondary | ICD-10-CM | POA: Diagnosis not present

## 2016-12-15 DIAGNOSIS — E119 Type 2 diabetes mellitus without complications: Secondary | ICD-10-CM

## 2016-12-15 DIAGNOSIS — K219 Gastro-esophageal reflux disease without esophagitis: Secondary | ICD-10-CM | POA: Diagnosis not present

## 2016-12-15 DIAGNOSIS — I2510 Atherosclerotic heart disease of native coronary artery without angina pectoris: Secondary | ICD-10-CM | POA: Diagnosis present

## 2016-12-15 DIAGNOSIS — E785 Hyperlipidemia, unspecified: Secondary | ICD-10-CM | POA: Diagnosis not present

## 2016-12-15 DIAGNOSIS — G459 Transient cerebral ischemic attack, unspecified: Secondary | ICD-10-CM | POA: Diagnosis present

## 2016-12-15 DIAGNOSIS — G629 Polyneuropathy, unspecified: Secondary | ICD-10-CM | POA: Diagnosis not present

## 2016-12-15 DIAGNOSIS — F329 Major depressive disorder, single episode, unspecified: Secondary | ICD-10-CM | POA: Diagnosis not present

## 2016-12-15 DIAGNOSIS — Z7901 Long term (current) use of anticoagulants: Secondary | ICD-10-CM | POA: Diagnosis not present

## 2016-12-15 DIAGNOSIS — A4101 Sepsis due to Methicillin susceptible Staphylococcus aureus: Secondary | ICD-10-CM | POA: Diagnosis not present

## 2016-12-15 LAB — BASIC METABOLIC PANEL
Anion gap: 8 (ref 5–15)
BUN: 19 mg/dL (ref 6–20)
CHLORIDE: 106 mmol/L (ref 101–111)
CO2: 24 mmol/L (ref 22–32)
CREATININE: 1.19 mg/dL (ref 0.61–1.24)
Calcium: 9.2 mg/dL (ref 8.9–10.3)
GFR calc Af Amer: >60 mL/min (ref >60)
GFR calc non Af Amer: >60 mL/min (ref >60)
Glucose, Bld: 61 mg/dL — ABNORMAL LOW (ref 65–99)
POTASSIUM: 4 mmol/L (ref 3.5–5.1)
SODIUM: 138 mmol/L (ref 135–145)

## 2016-12-15 LAB — URINALYSIS, COMPLETE (UACMP) WITH MICROSCOPIC
BACTERIA UA: NONE SEEN
Bilirubin Urine: NEGATIVE
GLUCOSE, UA: 150 mg/dL — AB
HGB URINE DIPSTICK: NEGATIVE
KETONES UR: NEGATIVE mg/dL
LEUKOCYTES UA: NEGATIVE
Nitrite: NEGATIVE
PROTEIN: 30 mg/dL — AB
Specific Gravity, Urine: 1.027 (ref 1.005–1.030)
pH: 5 (ref 5.0–8.0)

## 2016-12-15 LAB — CBC
HEMATOCRIT: 35.5 % — AB (ref 40.0–52.0)
Hemoglobin: 12.4 g/dL — ABNORMAL LOW (ref 13.0–18.0)
MCH: 29.2 pg (ref 26.0–34.0)
MCHC: 34.9 g/dL (ref 32.0–36.0)
MCV: 83.7 fL (ref 80.0–100.0)
Platelets: 203 10*3/uL (ref 150–440)
RBC: 4.24 MIL/uL — AB (ref 4.40–5.90)
RDW: 17.5 % — AB (ref 11.5–14.5)
WBC: 10.6 10*3/uL (ref 3.8–10.6)

## 2016-12-15 LAB — GLUCOSE, CAPILLARY: Glucose-Capillary: 87 mg/dL (ref 65–99)

## 2016-12-15 LAB — PROTIME-INR
INR: 1.17
PROTHROMBIN TIME: 15 s (ref 11.4–15.2)

## 2016-12-15 LAB — APTT: aPTT: 29 seconds (ref 24–36)

## 2016-12-15 MED ORDER — ASPIRIN EC 81 MG PO TBEC
81.0000 mg | DELAYED_RELEASE_TABLET | Freq: Every day | ORAL | Status: DC
  Administered 2016-12-16 – 2016-12-18 (×2): 81 mg via ORAL
  Filled 2016-12-15 (×3): qty 1

## 2016-12-15 MED ORDER — METOPROLOL TARTRATE 25 MG PO TABS
25.0000 mg | ORAL_TABLET | Freq: Two times a day (BID) | ORAL | Status: DC
  Administered 2016-12-15 – 2016-12-18 (×6): 25 mg via ORAL
  Filled 2016-12-15 (×6): qty 1

## 2016-12-15 MED ORDER — ACETAMINOPHEN 160 MG/5ML PO SOLN
650.0000 mg | ORAL | Status: DC | PRN
  Filled 2016-12-15: qty 20.3

## 2016-12-15 MED ORDER — METHADONE HCL 10 MG PO TABS
5.0000 mg | ORAL_TABLET | Freq: Two times a day (BID) | ORAL | Status: DC
  Administered 2016-12-15 – 2016-12-18 (×6): 5 mg via ORAL
  Filled 2016-12-15 (×6): qty 1

## 2016-12-15 MED ORDER — LORATADINE 10 MG PO TABS
10.0000 mg | ORAL_TABLET | Freq: Every day | ORAL | Status: DC
  Administered 2016-12-16 – 2016-12-18 (×3): 10 mg via ORAL
  Filled 2016-12-15 (×3): qty 1

## 2016-12-15 MED ORDER — METFORMIN HCL 500 MG PO TABS
1000.0000 mg | ORAL_TABLET | Freq: Two times a day (BID) | ORAL | Status: DC
  Administered 2016-12-15 – 2016-12-16 (×2): 1000 mg via ORAL
  Filled 2016-12-15 (×2): qty 2

## 2016-12-15 MED ORDER — VITAMIN B-12 1000 MCG PO TABS
1000.0000 ug | ORAL_TABLET | Freq: Every day | ORAL | Status: DC
  Administered 2016-12-15 – 2016-12-18 (×4): 1000 ug via ORAL
  Filled 2016-12-15 (×4): qty 1

## 2016-12-15 MED ORDER — WARFARIN SODIUM 5 MG PO TABS
10.0000 mg | ORAL_TABLET | Freq: Every day | ORAL | Status: DC
  Administered 2016-12-15 – 2016-12-17 (×3): 10 mg via ORAL
  Filled 2016-12-15 (×2): qty 10
  Filled 2016-12-15: qty 2

## 2016-12-15 MED ORDER — IOPAMIDOL (ISOVUE-370) INJECTION 76%
75.0000 mL | Freq: Once | INTRAVENOUS | Status: AC | PRN
  Administered 2016-12-15: 75 mL via INTRAVENOUS

## 2016-12-15 MED ORDER — LISINOPRIL 10 MG PO TABS
10.0000 mg | ORAL_TABLET | Freq: Every day | ORAL | Status: DC
Start: 2016-12-16 — End: 2016-12-18
  Administered 2016-12-16 – 2016-12-18 (×3): 10 mg via ORAL
  Filled 2016-12-15 (×3): qty 1

## 2016-12-15 MED ORDER — IPRATROPIUM-ALBUTEROL 0.5-2.5 (3) MG/3ML IN SOLN: 3.0000 mL | Freq: Four times a day (QID) | RESPIRATORY_TRACT | Status: DC | PRN

## 2016-12-15 MED ORDER — ACETAMINOPHEN 325 MG PO TABS: 650.0000 mg | ORAL_TABLET | ORAL | Status: DC | PRN

## 2016-12-15 MED ORDER — PRAVASTATIN SODIUM 40 MG PO TABS
40.0000 mg | ORAL_TABLET | Freq: Every day | ORAL | Status: DC
  Administered 2016-12-15 – 2016-12-17 (×3): 40 mg via ORAL
  Filled 2016-12-15 (×3): qty 1

## 2016-12-15 MED ORDER — GLIMEPIRIDE 2 MG PO TABS
2.0000 mg | ORAL_TABLET | Freq: Every day | ORAL | Status: DC
  Administered 2016-12-16: 09:00:00 2 mg via ORAL
  Filled 2016-12-15: qty 1

## 2016-12-15 MED ORDER — DULOXETINE HCL 60 MG PO CPEP
60.0000 mg | ORAL_CAPSULE | Freq: Every day | ORAL | Status: DC
  Administered 2016-12-15 – 2016-12-17 (×3): 60 mg via ORAL
  Filled 2016-12-15 (×3): qty 1

## 2016-12-15 MED ORDER — PANTOPRAZOLE SODIUM 40 MG PO TBEC
40.0000 mg | DELAYED_RELEASE_TABLET | Freq: Every day | ORAL | Status: DC
  Administered 2016-12-15 – 2016-12-18 (×3): 40 mg via ORAL
  Filled 2016-12-15 (×5): qty 1

## 2016-12-15 MED ORDER — HEPARIN (PORCINE) IN NACL 100-0.45 UNIT/ML-% IJ SOLN
1900.0000 [IU]/h | INTRAMUSCULAR | Status: DC
  Administered 2016-12-15: 20:00:00 1400 [IU]/h via INTRAVENOUS
  Administered 2016-12-16: 14:00:00 1700 [IU]/h via INTRAVENOUS
  Administered 2016-12-17 (×2): 1900 [IU]/h via INTRAVENOUS
  Filled 2016-12-15 (×7): qty 250

## 2016-12-15 MED ORDER — WARFARIN - PHYSICIAN DOSING INPATIENT
Freq: Every day | Status: DC
  Administered 2016-12-15 – 2016-12-17 (×2)

## 2016-12-15 MED ORDER — STROKE: EARLY STAGES OF RECOVERY BOOK
Freq: Once | Status: AC
  Administered 2016-12-15: 17:00:00

## 2016-12-15 MED ORDER — ACETAMINOPHEN 650 MG RE SUPP: 650.0000 mg | RECTAL | Status: DC | PRN

## 2016-12-15 MED ORDER — SODIUM CHLORIDE 0.9 % IV SOLN
12.0000 g | INTRAVENOUS | Status: DC
  Filled 2016-12-15: qty 250

## 2016-12-15 MED ORDER — RIFAMPIN 300 MG PO CAPS
300.0000 mg | ORAL_CAPSULE | Freq: Three times a day (TID) | ORAL | Status: DC
  Administered 2016-12-15 – 2016-12-18 (×9): 300 mg via ORAL
  Filled 2016-12-15 (×10): qty 1

## 2016-12-15 MED ORDER — AMIODARONE HCL 200 MG PO TABS
200.0000 mg | ORAL_TABLET | Freq: Every day | ORAL | Status: DC
  Administered 2016-12-16 – 2016-12-18 (×3): 200 mg via ORAL
  Filled 2016-12-15 (×3): qty 1

## 2016-12-15 MED ORDER — OXCARBAZEPINE 300 MG PO TABS
300.0000 mg | ORAL_TABLET | Freq: Two times a day (BID) | ORAL | Status: DC
  Administered 2016-12-15 – 2016-12-18 (×7): 300 mg via ORAL
  Filled 2016-12-15 (×8): qty 1

## 2016-12-15 MED ORDER — SODIUM CHLORIDE 0.9 % IV SOLN
12.0000 g | INTRAVENOUS | Status: DC
  Administered 2016-12-15 – 2016-12-17 (×3): 12 g via INTRAVENOUS
  Filled 2016-12-15 (×5): qty 250

## 2016-12-15 MED ORDER — GABAPENTIN 600 MG PO TABS
1200.0000 mg | ORAL_TABLET | Freq: Three times a day (TID) | ORAL | Status: DC
  Administered 2016-12-15 – 2016-12-18 (×9): 1200 mg via ORAL
  Filled 2016-12-15 (×10): qty 2

## 2016-12-15 MED ORDER — FUROSEMIDE 40 MG PO TABS
40.0000 mg | ORAL_TABLET | Freq: Every day | ORAL | Status: DC
Start: 2016-12-16 — End: 2016-12-18
  Administered 2016-12-16 – 2016-12-18 (×3): 40 mg via ORAL
  Filled 2016-12-15 (×3): qty 1

## 2016-12-15 MED ORDER — ADULT MULTIVITAMIN W/MINERALS CH
1.0000 | ORAL_TABLET | Freq: Every day | ORAL | Status: DC
  Administered 2016-12-16 – 2016-12-18 (×2): 1 via ORAL
  Filled 2016-12-15 (×3): qty 1

## 2016-12-15 MED ORDER — ENOXAPARIN SODIUM 120 MG/0.8ML ~~LOC~~ SOLN
1.0000 mg/kg | Freq: Once | SUBCUTANEOUS | Status: DC
  Filled 2016-12-15: qty 1.6

## 2016-12-15 MED ORDER — ENSURE ENLIVE PO LIQD
237.0000 mL | Freq: Two times a day (BID) | ORAL | Status: DC
  Administered 2016-12-15 – 2016-12-18 (×6): 237 mL via ORAL

## 2016-12-15 MED ORDER — SENNOSIDES-DOCUSATE SODIUM 8.6-50 MG PO TABS
1.0000 | ORAL_TABLET | Freq: Every evening | ORAL | Status: DC | PRN
  Filled 2016-12-15: qty 1

## 2016-12-15 NOTE — H&P (Signed)
[facility] NAME: [name]    MR#:  [id number]  DATE OF BIRTH:  [birth year]  DATE OF ADMISSION:  12/15/2016  PRIMARY CARE PHYSICIAN: SPARKS,JEFFREY D, MD   REQUESTING/REFERRING PHYSICIAN: Dr. Jimmye Norman  CHIEF COMPLAINT:  Slurred speech and left-sided weakness  HISTORY OF PRESENT ILLNESS:  [name]  is a 71 y.o. 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 Patient was trying to put creamer in his coffee this morning spelled cream are down and could not grip anything with his left hand. Wife started noticing some slurred speech and patient started stumbling with his left leg. Came to the emergency room and had CT head which was essentially negative for stroke. Patient is has chronic A. fib INR is subtherapeutic at 1.17. Recently his INR was 2.1 recheck after week was 1.2 where cardiology Dr. Nehemiah Massed increased his Coumadin to 10 mg daily. INR today is 1.17. Patient is being admitted for possible TIA  Patient is currently on IV nafcillin and oral rifampin for his aortic valve endocarditis. He is supposed to get antibiotics through March 16  PAST MEDICAL HISTORY:   Past Medical History:  Diagnosis Date  . Chronic pain   . Coronary artery disease   . Diabetes mellitus without complication ([place])   . DJD (degenerative joint disease)   . GERD (gastroesophageal reflux disease)   . Hyperlipemia   . Hypertension   . Insomnia   . Neuropathy ([place])     PAST SURGICAL HISTOIRY:   Past Surgical History:  Procedure Laterality Date  . BACK SURGERY    . CARDIAC SURGERY    . JOINT REPLACEMENT    . TEE WITHOUT CARDIOVERSION N/A 10/08/2016   Procedure: TRANSESOPHAGEAL ECHOCARDIOGRAM  (TEE);  Surgeon: Teodoro Spray, MD;  Location: [facility] ORS;  Service: Cardiovascular;  Laterality: N/A;  . TEE WITHOUT CARDIOVERSION N/A 11/23/2016   Procedure: TRANSESOPHAGEAL ECHOCARDIOGRAM (TEE);  Surgeon: Isaias Cowman, MD;  Location: [facility] ORS;  Service: Cardiovascular;  Laterality: N/A;    SOCIAL HISTORY:   Social History  Substance Use Topics  . Smoking status: Never Smoker  . Smokeless tobacco: Never Used  . Alcohol use No    FAMILY HISTORY:  No family history on file.  DRUG ALLERGIES:   Allergies  Allergen Reactions  . Rofecoxib Other (See Comments)    ulcers  . Tetracyclines & Related     REVIEW OF SYSTEMS:  Review of Systems  Constitutional: Negative for chills, fever and weight loss.  HENT: Negative for ear discharge, ear pain and nosebleeds.   Eyes: Negative for blurred vision, pain and discharge.  Respiratory: Negative for sputum production, shortness of breath, wheezing and stridor.   Cardiovascular: Negative for chest pain, palpitations, orthopnea and PND.  Gastrointestinal: Negative for abdominal pain, diarrhea, nausea and vomiting.  Genitourinary: Negative for frequency and urgency.  Musculoskeletal: Negative for back pain and joint pain.  Neurological: Positive for speech change, focal weakness and weakness. Negative for sensory change.  Psychiatric/Behavioral: Negative for depression and hallucinations. The patient is not nervous/anxious.      MEDICATIONS AT HOME:   Prior to Admission medications   Medication Sig Start Date End Date Taking? Authorizing Provider  amiodarone (PACERONE) 200 MG tablet Take 1 tablet (200  mg total) by mouth daily. 10/09/16  Yes Fritzi Mandes, MD  aspirin EC 81 MG tablet Take 1 tablet by mouth daily.   Yes Historical Provider, MD  Cyanocobalamin (RA VITAMIN B-12 TR) 1000 MCG TBCR Take 1 tablet by mouth daily.   Yes Historical Provider, MD  DULoxetine (CYMBALTA) 60 MG capsule Take 1 capsule by mouth at bedtime. 07/29/15  Yes  Historical Provider, MD  furosemide (LASIX) 40 MG tablet Take 1 tablet by mouth daily.  03/19/16  Yes Historical Provider, MD  gabapentin (NEURONTIN) 600 MG tablet Take 1,200 tablets by mouth 3 (three) times daily.  11/21/12  Yes Historical Provider, MD  glimepiride (AMARYL) 2 MG tablet Take 2 mg by mouth daily.   Yes Historical Provider, MD  ipratropium-albuterol (DUONEB) 0.5-2.5 (3) MG/3ML SOLN Take 3 mLs by nebulization every 6 (six) hours as needed. 10/09/16  Yes Fritzi Mandes, MD  lisinopril (PRINIVIL,ZESTRIL) 10 MG tablet Take 1 tablet by mouth daily. 02/10/16  Yes Historical Provider, MD  Loratadine 10 MG CAPS Take 1 capsule by mouth daily.   Yes Historical Provider, MD  meclizine (ANTIVERT) 25 MG tablet Take 1 tablet by mouth 3 (three) times daily. 03/19/16  Yes Historical Provider, MD  metFORMIN (GLUCOPHAGE) 1000 MG tablet Take 1 tablet by mouth 2 (two) times daily. With meals. 03/19/16  Yes Historical Provider, MD  methadone (DOLOPHINE) 5 MG tablet Take 5 mg by mouth 2 (two) times daily.  09/02/16  Yes Historical Provider, MD  metoprolol tartrate (LOPRESSOR) 25 MG tablet Take 1 tablet by mouth 2 (two) times daily. 07/20/16  Yes Historical Provider, MD  nafcillin 12 g in sodium chloride 0.9 % 250 mL Inject 12 g into the vein continuous. 11/24/16 01/01/17 Yes Fritzi Mandes, MD  pravastatin (PRAVACHOL) 40 MG tablet Take 1 tablet by mouth at bedtime. 02/10/16  Yes Historical Provider, MD  rifampin (RIFADIN) 300 MG capsule Take 1 capsule (300 mg total) by mouth every 8 (eight) hours. 11/24/16 01/01/17 Yes Fritzi Mandes, MD  warfarin (COUMADIN) 5 MG tablet Take 1 tablet (5 mg total) by mouth daily at 6 PM. Patient taking differently: Take 10 mg by mouth daily at 6 PM.  11/24/16  Yes Fritzi Mandes, MD  cyclobenzaprine (FLEXERIL) 10 MG tablet Take 1 tablet by mouth daily.    Historical Provider, MD  feeding supplement, ENSURE ENLIVE, (ENSURE ENLIVE) LIQD Take 237 mLs by mouth 2 (two) times daily between meals. 10/09/16   Fritzi Mandes, MD  KLOR-CON M20 20 MEQ tablet Take 1 tablet by mouth daily.    Historical Provider, MD  Multiple Vitamin (MULTI-VITAMINS) TABS Take 1 tablet by mouth daily.    Historical Provider, MD  omeprazole (PRILOSEC) 20 MG capsule Take 20 mg by mouth daily.    Historical Provider, MD  Oxcarbazepine (TRILEPTAL) 300 MG tablet Take 1 tablet by mouth 2 (two) times daily. 12/05/12   Historical Provider, MD      VITAL SIGNS:  Blood pressure (!) 124/97, pulse 99, temperature 98.5 F (36.9 C), temperature source Oral, resp. rate 18, height '5\' 10"'$  (1.778 m), weight 121.6 kg (268 lb), SpO2 97 %.  PHYSICAL EXAMINATION:  GENERAL:  71 y.o.-year-old patient lying in the bed with no acute distress.  EYES: Pupils equal, round, reactive to light and accommodation. No scleral icterus. Extraocular muscles intact.  HEENT: Head atraumatic, normocephalic. Oropharynx and nasopharynx clear.  NECK:  Supple, no jugular venous distention. No thyroid enlargement, no tenderness.  LUNGS: Normal breath sounds bilaterally, no wheezing, rales,rhonchi  or crepitation. No use of accessory muscles of respiration.  CARDIOVASCULAR: S1, S2 normal. No murmurs, rubs, or gallops.  ABDOMEN: Soft, nontender, nondistended. Bowel sounds present. No organomegaly or mass.  EXTREMITIES: No pedal edema, cyanosis, or clubbing.  NEUROLOGIC: Cranial nerves II through XII are intact. Weakness in left hand Sensation intact. Gait not checked. No slurred speech or drooling of saliva noted. PSYCHIATRIC: The patient is alert and oriented x 3.  SKIN: No obvious rash, lesion, or ulcer.   LABORATORY PANEL:   CBC  Recent Labs Lab 12/15/16 1233  WBC 10.6  HGB 12.4*  HCT 35.5*  PLT 203   ------------------------------------------------------------------------------------------------------------------  Chemistries   Recent Labs Lab 12/15/16 1233  NA 138  K 4.0  CL 106  CO2 24  GLUCOSE 61*  BUN 19  CREATININE 1.19  CALCIUM 9.2    ------------------------------------------------------------------------------------------------------------------  Cardiac Enzymes No results for input(s): TROPONINI in the last 168 hours. ------------------------------------------------------------------------------------------------------------------  RADIOLOGY:  Ct Angio Head W Or Wo Contrast  Addendum Date: 12/15/2016   ADDENDUM REPORT: 12/15/2016 15:16 ADDENDUM: Study discussed by telephone with Dr. Fritzi Mandes on 12/15/2016 at 1503 hours. Electronically Signed   By: Genevie Ann M.D.   On: 12/15/2016 15:16   Result Date: 12/15/2016 CLINICAL DATA:  71 year old male with left side weakness since 0400 hours today. Initial encounter. EXAM: CT ANGIOGRAPHY HEAD AND NECK TECHNIQUE: Multidetector CT imaging of the head and neck was performed using the standard protocol during bolus administration of intravenous contrast. Multiplanar CT image reconstructions and MIPs were obtained to evaluate the vascular anatomy. Carotid stenosis measurements (when applicable) are obtained utilizing NASCET criteria, using the distal internal carotid diameter as the denominator. CONTRAST:  75 mL Isovue 370 COMPARISON:  Head CT without contrast 1256 hours today. Brain MRI with intracranial MRA 03/07/2008 FINDINGS: CTA NECK Skeleton: Extensive previous cervical spine ACDF. Previous median sternotomy. No acute osseous abnormality identified. Visualized paranasal sinuses and mastoids are stable and well pneumatized. Upper chest: Incidental azygos fissure. Mild mosaic attenuation in the visualized bilateral lung parenchyma. No superior mediastinal lymphadenopathy. Other neck: Thyroid, larynx, pharynx, parapharyngeal spaces, retropharyngeal space, sublingual space, submandibular glands and parotid glands are within normal limits. No cervical lymphadenopathy. Aortic arch: Bovine type arch configuration. Mild to moderate aortic arch calcified atherosclerosis. Right carotid system: No  brachiocephalic or right CCA origin stenosis despite soft and calcified plaque. Mildly tortuous proximal right ICA. Widely patent right carotid bifurcation with minimal soft plaque. Mildly tortuous cervical right ICA. Left carotid system: Bovine type left CCA origin with no origin stenosis. Mild circumferential soft plaque in the left CCA. Widely patent left carotid bifurcation. Mild left ICA bulb soft and calcified plaque without stenosis. Mildly tortuous cervical left ICA. Vertebral arteries:Mild tortuosity of the proximal right subclavian artery with soft and calcified plaque but no stenosis. Calcified plaque at the right vertebral artery origin with mild to moderate origin stenosis (Series 7, image 167). Otherwise negative right vertebral artery to the skullbase. No significant proximal left subclavian artery stenosis despite soft and calcified plaque. Calcified plaque at the left vertebral artery origin with severe origin stenosis (series 8, image 137). The left vertebral artery is mildly non dominant and otherwise negative to the skullbase. CTA HEAD Posterior circulation: Dominant distal right vertebral artery with no stenosis to the vertebrobasilar junction. Normal right PICA origin. Calcified plaque in the non dominant distal left V4 segment distal to the low PICA origin which remains normal. Subsequent moderate distal left vertebral stenosis, but the vessel remains patent  to the vertebrobasilar junction. No basilar stenosis. Patent SCA and PCA origins. Both posterior communicating arteries are present but small. Right PCA branches are normal. There is mild irregularity without significant stenosis in the left P1 segment. Left PCA branches are within normal limits. Anterior circulation: Both ICA siphons are patent. Minimal siphon calcified plaque without stenosis. Normal bilateral ophthalmic and posterior communicating artery origins. Patent carotid termini. Normal MCA and ACA origins. Anterior communicating  artery and bilateral ACA branches are normal with a prominent median artery of the corpus callosum (normal variant). Left MCA M1 segment, trifurcation, and left MCA branches are normal. Right MCA M1 segment, trifurcation, and proximal M2 branches are normal. There is a severe stenosis at the bifurcation of a posterior right MCA M2 branch best seen on series 8, image 61 (also series 11, image 14 and series 6, image 100), but the downstream branches remain patent. Venous sinuses: Patent. Anatomic variants: Dominant right vertebral artery. Median artery of the corpus callosum. Bovine type aortic arch configuration. Delayed phase: Small age indeterminate cortically based infarct at the right sensory strip (series 12, image 24), might have been present in December 2017. Otherwise no cortically based infarct is evident in the right MCA territory. No acute intracranial hemorrhage identified. No intracranial mass effect. No abnormal enhancement identified. Review of the MIP images confirms the above findings IMPRESSION: 1. Negative for emergent large vessel occlusion but positive for filling defect at the bifurcation of a right MCA middle M2 branch. Despite this severe stenosis the downstream M3 branches remain patent. 2. Otherwise mild for age intra- and extracranial carotid and anterior circulation atherosclerosis. 3. Dominant right vertebral artery with up to moderate stenosis at its origin. Severe stenosis of the non dominant left vertebral artery at both its origin and distal V4 segment. The left PICA remains patent. 4. Mild atherosclerosis without stenosis of the left PCA P1 segment. 5. Small age indeterminate cortical infarct in the posterior right MCA territory sensory strip. No other right MCA territory ischemic changes by CT. 6. Mild mosaic attenuation in the upper lungs may indicate gas trapping. 7.  Calcified aortic atherosclerosis. Electronically Signed: By: Genevie Ann M.D. On: 12/15/2016 14:59   Ct Head Wo  Contrast  Result Date: 12/15/2016 CLINICAL DATA:  71 year old male with slurred speech and left arm weakness since 4 a.m. today. Two recent episodes of sepsis since December 2017. Diaphoresis. EXAM: CT HEAD WITHOUT CONTRAST TECHNIQUE: Contiguous axial images were obtained from the base of the skull through the vertex without intravenous contrast. COMPARISON:  Head CT 10/02/2016. FINDINGS: Brain: Patchy and confluent areas of decreased attenuation are noted throughout the deep and periventricular white matter of the cerebral hemispheres bilaterally, compatible with chronic microvascular ischemic disease. No evidence of acute infarction, hemorrhage, hydrocephalus, extra-axial collection or mass lesion/mass effect. Vascular: No hyperdense vessel or unexpected calcification. Skull: Normal. Negative for fracture or focal lesion. Sinuses/Orbits: No acute finding. Other: None. IMPRESSION: 1. No acute intracranial abnormalities. 2. Chronic microvascular ischemic changes in the cerebral white matter. Electronically Signed   By: Vinnie Langton M.D.   On: 12/15/2016 13:04   Ct Angio Neck W Or Wo Contrast  Addendum Date: 12/15/2016   ADDENDUM REPORT: 12/15/2016 15:16 ADDENDUM: Study discussed by telephone with Dr. Fritzi Mandes on 12/15/2016 at 1503 hours. Electronically Signed   By: Genevie Ann M.D.   On: 12/15/2016 15:16   Result Date: 12/15/2016 CLINICAL DATA:  71 year old male with left side weakness since 0400 hours today. Initial encounter. EXAM: CT  ANGIOGRAPHY HEAD AND NECK TECHNIQUE: Multidetector CT imaging of the head and neck was performed using the standard protocol during bolus administration of intravenous contrast. Multiplanar CT image reconstructions and MIPs were obtained to evaluate the vascular anatomy. Carotid stenosis measurements (when applicable) are obtained utilizing NASCET criteria, using the distal internal carotid diameter as the denominator. CONTRAST:  75 mL Isovue 370 COMPARISON:  Head CT without  contrast 1256 hours today. Brain MRI with intracranial MRA 03/07/2008 FINDINGS: CTA NECK Skeleton: Extensive previous cervical spine ACDF. Previous median sternotomy. No acute osseous abnormality identified. Visualized paranasal sinuses and mastoids are stable and well pneumatized. Upper chest: Incidental azygos fissure. Mild mosaic attenuation in the visualized bilateral lung parenchyma. No superior mediastinal lymphadenopathy. Other neck: Thyroid, larynx, pharynx, parapharyngeal spaces, retropharyngeal space, sublingual space, submandibular glands and parotid glands are within normal limits. No cervical lymphadenopathy. Aortic arch: Bovine type arch configuration. Mild to moderate aortic arch calcified atherosclerosis. Right carotid system: No brachiocephalic or right CCA origin stenosis despite soft and calcified plaque. Mildly tortuous proximal right ICA. Widely patent right carotid bifurcation with minimal soft plaque. Mildly tortuous cervical right ICA. Left carotid system: Bovine type left CCA origin with no origin stenosis. Mild circumferential soft plaque in the left CCA. Widely patent left carotid bifurcation. Mild left ICA bulb soft and calcified plaque without stenosis. Mildly tortuous cervical left ICA. Vertebral arteries:Mild tortuosity of the proximal right subclavian artery with soft and calcified plaque but no stenosis. Calcified plaque at the right vertebral artery origin with mild to moderate origin stenosis (Series 7, image 167). Otherwise negative right vertebral artery to the skullbase. No significant proximal left subclavian artery stenosis despite soft and calcified plaque. Calcified plaque at the left vertebral artery origin with severe origin stenosis (series 8, image 137). The left vertebral artery is mildly non dominant and otherwise negative to the skullbase. CTA HEAD Posterior circulation: Dominant distal right vertebral artery with no stenosis to the vertebrobasilar junction. Normal  right PICA origin. Calcified plaque in the non dominant distal left V4 segment distal to the low PICA origin which remains normal. Subsequent moderate distal left vertebral stenosis, but the vessel remains patent to the vertebrobasilar junction. No basilar stenosis. Patent SCA and PCA origins. Both posterior communicating arteries are present but small. Right PCA branches are normal. There is mild irregularity without significant stenosis in the left P1 segment. Left PCA branches are within normal limits. Anterior circulation: Both ICA siphons are patent. Minimal siphon calcified plaque without stenosis. Normal bilateral ophthalmic and posterior communicating artery origins. Patent carotid termini. Normal MCA and ACA origins. Anterior communicating artery and bilateral ACA branches are normal with a prominent median artery of the corpus callosum (normal variant). Left MCA M1 segment, trifurcation, and left MCA branches are normal. Right MCA M1 segment, trifurcation, and proximal M2 branches are normal. There is a severe stenosis at the bifurcation of a posterior right MCA M2 branch best seen on series 8, image 61 (also series 11, image 14 and series 6, image 100), but the downstream branches remain patent. Venous sinuses: Patent. Anatomic variants: Dominant right vertebral artery. Median artery of the corpus callosum. Bovine type aortic arch configuration. Delayed phase: Small age indeterminate cortically based infarct at the right sensory strip (series 12, image 24), might have been present in December 2017. Otherwise no cortically based infarct is evident in the right MCA territory. No acute intracranial hemorrhage identified. No intracranial mass effect. No abnormal enhancement identified. Review of the MIP images confirms the above  findings IMPRESSION: 1. Negative for emergent large vessel occlusion but positive for filling defect at the bifurcation of a right MCA middle M2 branch. Despite this severe stenosis  the downstream M3 branches remain patent. 2. Otherwise mild for age intra- and extracranial carotid and anterior circulation atherosclerosis. 3. Dominant right vertebral artery with up to moderate stenosis at its origin. Severe stenosis of the non dominant left vertebral artery at both its origin and distal V4 segment. The left PICA remains patent. 4. Mild atherosclerosis without stenosis of the left PCA P1 segment. 5. Small age indeterminate cortical infarct in the posterior right MCA territory sensory strip. No other right MCA territory ischemic changes by CT. 6. Mild mosaic attenuation in the upper lungs may indicate gas trapping. 7.  Calcified aortic atherosclerosis. Electronically Signed: By: Genevie Ann M.D. On: 12/15/2016 14:59    EKG:  atrial fibrillation  IMPRESSION AND PLAN:   [name]  is a 71 y.o. 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   1. TIA in the setting of chronic atrial fibrillation. Patient presented with left upper and lower extremity weakness with slurred speech. CT head negative. -Stat CTA of head and neck stenosis of the right distal MCA territory. No acute infarct noted on the CTA of the head per radiology -MRI brain pending -patient's INR is subtherapeutic. Awaiting MRI brain results. If no infarct will bridge with Lovenox. -Neurology consultation with Dr. Regenia Skeeter  2. Infective aortic valve endocarditis -Continue IV nafcillin and oral rifampin patient to get antibiotics through PICC line till March 16 -He follows with Dr. Ola Spurr as outpatient  3. Chronic atrial fibrillation -Continue Coumadin dosing per pharmacy -We'll decide about Lovenox bridging after MRI results -Continue metoprolol and amiodarone  4.  Type 2 diabetes Sliding scale at home meds  5. Hypertension continue home meds  6. DVT prophylaxis On Coumadin  All the records are reviewed and case discussed with ED provider. Management plans discussed with the patient, family and they are in agreement.  CODE STATUS: FULL  TOTAL TIME TAKING CARE OF THIS PATIENT: 50 minutes.    Julie Nay M.D on 12/15/2016 at 3:32 PM  Between 7am to 6pm - Pager - [phone]  After 6pm go to [email or web address] - password [email or web address] [facility]  [facility]  Office  [phone]  CC: Primary care physician; Idelle Crouch, MD

## 2016-12-15 NOTE — Progress Notes (Signed)
Spoke with dr Doy Mince about the MRI results and new small strokes as seen on the MRI appears possible cardioembolic Given his recent endocarditis and subtherapuetic INR will start on heparin gtt. Pt and wife made aware about the risk of hemorrhage. Will do frequent neuro checks Spoke with pharmacy to start heparin gtt... No bolus

## 2016-12-15 NOTE — ED Notes (Signed)
Pt in CT.

## 2016-12-15 NOTE — ED Notes (Signed)
Dr. Reynolds at bedside.

## 2016-12-15 NOTE — ED Notes (Signed)
Report given to [place], Therapist, sports.

## 2016-12-15 NOTE — ED Provider Notes (Addendum)
[facility] Emergency Department Provider Note  ____________________________________________   I have reviewed the triage vital signs and the nursing notes.   HISTORY  Chief Complaint Weakness    HPI [name] is a 71 y.o. male with a very, complicated medical history including IV antibiotics for endocarditis according to patient. He presents today complaining of left-sided weakness began this morning he noticed at 4:00 when he woke up to go to the bathroom. He is making coffee and found himself dropping the creamer in his left hand. Since that time he has had left-sided upper and lower extremity weakness. He denies any headache, he denies a closed head injury, difficulty speaking, he denies any change in vision or hearing. He is never had a CVA before.    Past Medical History:  Diagnosis Date  . Chronic pain   . Coronary artery disease   . Diabetes mellitus without complication ([place])   . DJD (degenerative joint disease)   . GERD (gastroesophageal reflux disease)   . Hyperlipemia   . Hypertension   . Insomnia   . Neuropathy [facility])     Patient Active Problem List   Diagnosis Date Noted  . Septic shock ([place]) 11/19/2016  . Pressure injury of skin 10/03/2016  . Sepsis ([place]) 10/02/2016    Past Surgical History:  Procedure Laterality Date  . BACK SURGERY    . CARDIAC SURGERY    . JOINT REPLACEMENT    . TEE WITHOUT CARDIOVERSION N/A 10/08/2016   Procedure: TRANSESOPHAGEAL ECHOCARDIOGRAM (TEE);  Surgeon: Teodoro Spray, MD;  Location: [facility] ORS;  Service: Cardiovascular;  Laterality: N/A;  . TEE WITHOUT CARDIOVERSION N/A 11/23/2016   Procedure: TRANSESOPHAGEAL ECHOCARDIOGRAM (TEE);  Surgeon: Isaias Cowman, MD;  Location: [facility] ORS;  Service: Cardiovascular;  Laterality: N/A;    Prior to Admission medications   Medication Sig Start Date End Date Taking? Authorizing Provider  amiodarone (PACERONE) 200 MG tablet Take 1 tablet (200 mg total) by  mouth daily. 10/09/16   Fritzi Mandes, MD  aspirin EC 81 MG tablet Take 1 tablet by mouth daily.    Historical Provider, MD  Cyanocobalamin (RA VITAMIN B-12 TR) 1000 MCG TBCR Take 1 tablet by mouth daily.    Historical Provider, MD  cyclobenzaprine (FLEXERIL) 10 MG tablet Take 1 tablet by mouth daily.    Historical Provider, MD  DULoxetine (CYMBALTA) 60 MG capsule Take 1 capsule by mouth at bedtime. 07/29/15   Historical Provider, MD  feeding supplement, ENSURE ENLIVE, (ENSURE ENLIVE) LIQD Take 237 mLs by mouth 2 (two) times daily between meals. 10/09/16   Fritzi Mandes, MD  furosemide (LASIX) 40 MG tablet Take 1 tablet by mouth 2 (two) times daily. 03/19/16   Historical Provider, MD  gabapentin (NEURONTIN) 600 MG tablet Take 1 tablet by mouth 3 (three) times daily. 11/21/12   Historical Provider, MD  gentamicin 120 mg in dextrose 5 % 50 mL Inject 120 mg into the vein every 12 (twelve) hours. 11/24/16   Fritzi Mandes, MD  glimepiride (AMARYL) 2 MG tablet Take 2 mg by mouth daily.    Historical Provider, MD  ipratropium-albuterol (DUONEB) 0.5-2.5 (3) MG/3ML SOLN Take 3 mLs by nebulization every 6 (six) hours as needed. 10/09/16   Fritzi Mandes, MD  KLOR-CON M20 20 MEQ tablet Take 1 tablet by mouth daily.    Historical Provider, MD  lisinopril (PRINIVIL,ZESTRIL) 10 MG tablet Take 1 tablet by mouth daily. 02/10/16   Historical Provider, MD  Loratadine 10 MG CAPS Take 1  capsule by mouth daily.    Historical Provider, MD  meclizine (ANTIVERT) 25 MG tablet Take 1 tablet by mouth 3 (three) times daily. 03/19/16   Historical Provider, MD  metFORMIN (GLUCOPHAGE) 1000 MG tablet Take 1 tablet by mouth 2 (two) times daily. With meals. 03/19/16   Historical Provider, MD  methadone (DOLOPHINE) 5 MG tablet Take 1 tablet by mouth 3 (three) times daily. 09/02/16   Historical Provider, MD  metoprolol tartrate (LOPRESSOR) 25 MG tablet Take 1 tablet by mouth 2 (two) times daily. 07/20/16   Historical Provider, MD  Multiple Vitamin  (MULTI-VITAMINS) TABS Take 1 tablet by mouth daily.    Historical Provider, MD  nafcillin 12 g in sodium chloride 0.9 % 250 mL Inject 12 g into the vein continuous. 11/24/16 01/01/17  Fritzi Mandes, MD  omeprazole (PRILOSEC) 20 MG capsule Take 20 mg by mouth daily.    Historical Provider, MD  Oxcarbazepine (TRILEPTAL) 300 MG tablet Take 1 tablet by mouth 2 (two) times daily. 12/05/12   Historical Provider, MD  polyethylene glycol (MIRALAX / GLYCOLAX) packet Take 17 g by mouth daily. 10/09/16   Fritzi Mandes, MD  pravastatin (PRAVACHOL) 40 MG tablet Take 1 tablet by mouth at bedtime. 02/10/16   Historical Provider, MD  rifampin (RIFADIN) 300 MG capsule Take 1 capsule (300 mg total) by mouth every 8 (eight) hours. 11/24/16 01/01/17  Fritzi Mandes, MD  tamsulosin (FLOMAX) 0.4 MG CAPS capsule Take 1 capsule (0.4 mg total) by mouth daily. 10/09/16   Fritzi Mandes, MD  warfarin (COUMADIN) 5 MG tablet Take 1 tablet (5 mg total) by mouth daily at 6 PM. 11/24/16   Fritzi Mandes, MD    Allergies Rofecoxib and Tetracyclines & related  No family history on file.  Social History Social History  Substance Use Topics  . Smoking status: Never Smoker  . Smokeless tobacco: Never Used  . Alcohol use No    Review of Systems Constitutional: No fever/chills Eyes: No visual changes. ENT: No sore throat. No stiff neck no neck pain Cardiovascular: Denies chest pain. Respiratory: Denies shortness of breath. Gastrointestinal:   no vomiting.  No diarrhea.  No constipation. Genitourinary: Negative for dysuria. Musculoskeletal: Negative lower extremity swelling Skin: Negative for rash. Neurological: Negative for severe headaches, See history of present illness regarding weakness 10-point ROS otherwise negative.  ____________________________________________   PHYSICAL EXAM:  VITAL SIGNS: ED Triage Vitals [12/15/16 1222]  Enc Vitals Group     BP (!) 159/59     Pulse Rate 81     Resp 15     Temp 98.1 F (36.7 C)     Temp  Source Oral     SpO2 100 %     Weight 268 lb (121.6 kg)     Height '5\' 10"'$  (1.778 m)     Head Circumference      Peak Flow      Pain Score      Pain Loc      Pain Edu?      Excl. in [place]?     Constitutional: Alert and oriented. Well appearing and in no acute distress. Eyes: Conjunctivae are normal. PERRL. EOMI. Head: Atraumatic. Nose: No congestion/rhinnorhea. Mouth/Throat: Mucous membranes are moist.  Oropharynx non-erythematous. Neck: No stridor.   Nontender with no meningismus Cardiovascular: Normal rate, regular rhythm. Grossly normal heart sounds.  Good peripheral circulation. Respiratory: Normal respiratory effort.  No retractions. Lungs CTAB. Abdominal: Soft and nontender. No distention. No guarding no rebound Back:  There is  no focal tenderness or step off.  there is no midline tenderness there are no lesions noted. there is no CVA tenderness Musculoskeletal: No lower extremity tenderness, no upper extremity tenderness. No joint effusions, no DVT signs strong distal pulses no edema Neurologic:  Normal speech and language. There is left upper and left lower extremity weakness. He has no drift but his grip strength is less on the left than on the right, and his left lower extremity is weaker than the right although he can hold it up for 10 seconds. Cranial nerves are grossly intact, speech is normal. Nose within normal limits Skin:  Skin is warm, dry and intact. No rash noted. Psychiatric: Mood and affect are normal. Speech and behavior are normal.  ____________________________________________   LABS (all labs ordered are listed, but only abnormal results are displayed)  Labs Reviewed  CBC - Abnormal; Notable for the following:       Result Value   RBC 4.24 (*)    Hemoglobin 12.4 (*)    HCT 35.5 (*)    RDW 17.5 (*)    All other components within normal limits  BASIC METABOLIC PANEL  URINALYSIS, COMPLETE (UACMP) WITH MICROSCOPIC  PROTIME-INR  CBG MONITORING, ED    ____________________________________________  EKG  I personally interpreted any EKGs ordered by me or triage Likely atrial fib, rate 77 bpm, normal axis, nonspecific ST changes. No acute ischemia noted. Wandering baseline does limit interpretation ____________________________________________  RADIOLOGY  I reviewed any imaging ordered by me or triage that were performed during my shift and, if possible, patient and/or family made aware of any abnormal findings. ____________________________________________   PROCEDURES  Procedure(s) performed: None  Procedures  Critical Care performed: None  ____________________________________________   INITIAL IMPRESSION / ASSESSMENT AND PLAN / ED COURSE  Pertinent labs & imaging results that were available during my care of the patient were reviewed by me and considered in my medical decision making (see chart for details).  A shin with left-sided weakness this morning concerning for CVA. However he is almost 9 hours out from the initial event which makes him not a good candidate for TPA. We will obtain CT scan, blood work and reassess. Patient does mention that they found his INR to be well yesterday and did try to increase his Coumadin which she took yesterday on an increased dose.  ----------------------------------------- 1:56 PM on 12/15/2016 -----------------------------------------  Discussed with hospitalist and would like me to give him Lovenox which we will do because he has low INR and A. fib and evidence of a CVA. Dr. Posey Pronto will take over care of the patient      ----------------------------------------- 2:21 PM on 12/15/2016 -----------------------------------------  After discussion with neurology, hospitalist is asked me to forego Lovenox therefore we will not give a pending neurology consult ____________________________________________   FINAL CLINICAL IMPRESSION(S) / ED DIAGNOSES  Final diagnoses:  None       This chart was dictated using voice recognition software.  Despite best efforts to proofread,  errors can occur which can change meaning.      Schuyler Amor, MD 12/15/16 [place], MD 12/15/16 [place], MD 12/15/16 [place], MD 12/15/16 [place], MD 12/15/16 [place], MD 12/15/16 [phone]

## 2016-12-15 NOTE — ED Notes (Signed)
Pt back from CT

## 2016-12-15 NOTE — Progress Notes (Signed)
ANTICOAGULATION CONSULT NOTE - Follow Up Consult  Pharmacy Consult for heparin and warfarin dosing Indication: atrial fibrillation and possible cardioembolic stroke  Allergies  Allergen Reactions  . Rofecoxib Other (See Comments)    ulcers  . Tetracyclines & Related     Patient Measurements: Height: '5\' 10"'$  (177.8 cm) Weight: 268 lb (121.6 kg) IBW/kg (Calculated) : 73 Heparin Dosing Weight: 100 kg  Vital Signs: Temp: 98.5 F (36.9 C) (02/27 1523) Temp Source: Oral (02/27 1523) BP: 124/97 (02/27 1523) Pulse Rate: 99 (02/27 1523)  Labs:  Recent Labs  12/15/16 1233 12/15/16 1300  HGB 12.4*  --   HCT 35.5*  --   PLT 203  --   LABPROT  --  15.0  INR  --  1.17  CREATININE 1.19  --     Estimated Creatinine Clearance: 75.5 mL/min (by C-G formula based on SCr of 1.19 mg/dL).   Medications:  Scheduled:  . [START ON 12/16/2016] amiodarone  200 mg Oral Daily  . [START ON 12/16/2016] aspirin EC  81 mg Oral Daily  . DULoxetine  60 mg Oral QHS  . feeding supplement (ENSURE ENLIVE)  237 mL Oral BID BM  . [START ON 12/16/2016] furosemide  40 mg Oral Daily  . gabapentin  1,200 mg Oral TID  . [START ON 12/16/2016] glimepiride  2 mg Oral Daily  . [START ON 12/16/2016] lisinopril  10 mg Oral Daily  . [START ON 12/16/2016] loratadine  10 mg Oral Daily  . metFORMIN  1,000 mg Oral BID WC  . methadone  5 mg Oral BID  . metoprolol tartrate  25 mg Oral BID  . multivitamin with minerals  1 tablet Oral Daily  . Oxcarbazepine  300 mg Oral BID  . pantoprazole  40 mg Oral Daily  . pravastatin  40 mg Oral QHS  . rifampin  300 mg Oral Q8H  . vitamin B-12  1,000 mcg Oral Daily  . warfarin  10 mg Oral q1800  . Warfarin - Physician Dosing Inpatient   Does not apply q1800   Infusions:  . heparin    . sodium chloride 0.9 % 250 mL with nafcillin 12 g infusion 12 g (12/15/16 1845)    Assessment: Pt is 89 yoM with afib on warfarin PTA admitted with possible cardioembolic stroke with mitral  valve replacement in 2014. 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 INR today is 1.17. Per PTA med list, his warfarin dose was increased from 5 mg daily to 10 mg daily by Dr. Nehemiah Massed on 2/26. Will begin bridge with heparin to therapeutic INR (2.5 - 3.5). Baseline labs ordered. PT/INR ordered daily.  Goal of Therapy:  INR 2.5 - 3.5 Heparin level goal: 0.3 - 0.5 (CVA goal) Monitor platelets by anticoagulation protocol: Yes   Plan:  Begin heparin 1400 u/hr with no bolus. Will check antiXa level in 6 hours. Will begin patient at recently modified warfarin dose of 10 mg  Date  INR  Dose 2/27  1.17  10 mg  Darrow Bussing, PharmD Pharmacy Resident 12/15/2016 7:26 PM

## 2016-12-15 NOTE — ED Notes (Signed)
Attempt to call report. Nurse unable to at this time due to "bed just assigned less than one minute ago". Gave number for callback

## 2016-12-15 NOTE — Consult Note (Signed)
Referring Physician: patel    Chief Complaint: Left sided weakness  HPI: [name] is an 71 y.o. male with a history of endocarditis and atrial fibrillation on Coumadin who presents with complaints of left sided weakness.  Patient reports that he awakened at baseline.  Was able to check his blood sugar and go to the kitchen. While making coffee became unable to hold anything in his left hand.  Then noted some LLE weakness as well.  Speech became slurred.  Although symptoms have improved to some degree, he still remains weak on the left and has presented for evaluation.  Initial NIHSS of 0.  Date last known well: Date: 12/15/2016 Time last known well: Time: 04:00 tPA Given: No: Patient outside time window  Past Medical History:  Diagnosis Date  . Chronic pain   . Coronary artery disease   . Diabetes mellitus without complication ([place])   . DJD (degenerative joint disease)   . GERD (gastroesophageal reflux disease)   . Hyperlipemia   . Hypertension   . Insomnia   . Neuropathy [facility])     Past Surgical History:  Procedure Laterality Date  . BACK SURGERY    . CARDIAC SURGERY    . JOINT REPLACEMENT    . TEE WITHOUT CARDIOVERSION N/A 10/08/2016   Procedure: TRANSESOPHAGEAL ECHOCARDIOGRAM (TEE);  Surgeon: Teodoro Spray, MD;  Location: [facility] ORS;  Service: Cardiovascular;  Laterality: N/A;  . TEE WITHOUT CARDIOVERSION N/A 11/23/2016   Procedure: TRANSESOPHAGEAL ECHOCARDIOGRAM (TEE);  Surgeon: Isaias Cowman, MD;  Location: [facility] ORS;  Service: Cardiovascular;  Laterality: N/A;    Family history: Father deceased with CAD s/p MI at 29.  Mother and two brothers deceased, one with COPD and one with pulmonary fibrosis.    Social History:  reports that he has never smoked. He has never used smokeless tobacco. He reports that he does not drink alcohol. His drug history is not on file.  Allergies:  Allergies  Allergen Reactions  . Rofecoxib Other (See Comments)    ulcers  .  Tetracyclines & Related     Medications: I have reviewed the patient's current medications. Prior to Admission:  Prior to Admission medications   Medication Sig Start Date End Date Taking? Authorizing Provider  amiodarone (PACERONE) 200 MG tablet Take 1 tablet (200 mg total) by mouth daily. 10/09/16  Yes Fritzi Mandes, MD  aspirin EC 81 MG tablet Take 1 tablet by mouth daily.   Yes Historical Provider, MD  Cyanocobalamin (RA VITAMIN B-12 TR) 1000 MCG TBCR Take 1 tablet by mouth daily.   Yes Historical Provider, MD  DULoxetine (CYMBALTA) 60 MG capsule Take 1 capsule by mouth at bedtime. 07/29/15  Yes Historical Provider, MD  furosemide (LASIX) 40 MG tablet Take 1 tablet by mouth daily.  03/19/16  Yes Historical Provider, MD  gabapentin (NEURONTIN) 600 MG tablet Take 1,200 tablets by mouth 3 (three) times daily.  11/21/12  Yes Historical Provider, MD  glimepiride (AMARYL) 2 MG tablet Take 2 mg by mouth daily.   Yes Historical Provider, MD  ipratropium-albuterol (DUONEB) 0.5-2.5 (3) MG/3ML SOLN Take 3 mLs by nebulization every 6 (six) hours as needed. 10/09/16  Yes Fritzi Mandes, MD  lisinopril (PRINIVIL,ZESTRIL) 10 MG tablet Take 1 tablet by mouth daily. 02/10/16  Yes Historical Provider, MD  Loratadine 10 MG CAPS Take 1 capsule by mouth daily.   Yes Historical Provider, MD  meclizine (ANTIVERT) 25 MG tablet Take 1 tablet by mouth 3 (three) times daily. 03/19/16  Yes  Historical Provider, MD  metFORMIN (GLUCOPHAGE) 1000 MG tablet Take 1 tablet by mouth 2 (two) times daily. With meals. 03/19/16  Yes Historical Provider, MD  methadone (DOLOPHINE) 5 MG tablet Take 5 mg by mouth 2 (two) times daily.  09/02/16  Yes Historical Provider, MD  metoprolol tartrate (LOPRESSOR) 25 MG tablet Take 1 tablet by mouth 2 (two) times daily. 07/20/16  Yes Historical Provider, MD  nafcillin 12 g in sodium chloride 0.9 % 250 mL Inject 12 g into the vein continuous. 11/24/16 01/01/17 Yes Fritzi Mandes, MD  pravastatin (PRAVACHOL) 40 MG  tablet Take 1 tablet by mouth at bedtime. 02/10/16  Yes Historical Provider, MD  rifampin (RIFADIN) 300 MG capsule Take 1 capsule (300 mg total) by mouth every 8 (eight) hours. 11/24/16 01/01/17 Yes Fritzi Mandes, MD  warfarin (COUMADIN) 5 MG tablet Take 1 tablet (5 mg total) by mouth daily at 6 PM. Patient taking differently: Take 10 mg by mouth daily at 6 PM.  11/24/16  Yes Fritzi Mandes, MD  cyclobenzaprine (FLEXERIL) 10 MG tablet Take 1 tablet by mouth daily.    Historical Provider, MD  feeding supplement, ENSURE ENLIVE, (ENSURE ENLIVE) LIQD Take 237 mLs by mouth 2 (two) times daily between meals. 10/09/16   Fritzi Mandes, MD  KLOR-CON M20 20 MEQ tablet Take 1 tablet by mouth daily.    Historical Provider, MD  Multiple Vitamin (MULTI-VITAMINS) TABS Take 1 tablet by mouth daily.    Historical Provider, MD  omeprazole (PRILOSEC) 20 MG capsule Take 20 mg by mouth daily.    Historical Provider, MD  Oxcarbazepine (TRILEPTAL) 300 MG tablet Take 1 tablet by mouth 2 (two) times daily. 12/05/12   Historical Provider, MD     ROS: History obtained from the patient  General ROS: negative for - chills, fatigue, fever, night sweats, weight gain or weight loss Psychological ROS: negative for - behavioral disorder, hallucinations, memory difficulties, mood swings or suicidal ideation Ophthalmic ROS: negative for - blurry vision, double vision, eye pain or loss of vision ENT ROS: negative for - epistaxis, nasal discharge, oral lesions, sore throat, tinnitus or vertigo Allergy and Immunology ROS: negative for - hives or itchy/watery eyes Hematological and Lymphatic ROS: negative for - bleeding problems, bruising or swollen lymph nodes Endocrine ROS: negative for - galactorrhea, hair pattern changes, polydipsia/polyuria or temperature intolerance Respiratory ROS: negative for - cough, hemoptysis, shortness of breath or wheezing Cardiovascular ROS: negative for - chest pain, dyspnea on exertion, edema or irregular  heartbeat Gastrointestinal ROS: negative for - abdominal pain, diarrhea, hematemesis, nausea/vomiting or stool incontinence Genito-Urinary ROS: negative for - dysuria, hematuria, incontinence or urinary frequency/urgency Musculoskeletal ROS: negative for - joint swelling or muscular weakness Neurological ROS: as noted in HPI Dermatological ROS: negative for rash and skin lesion changes  Physical Examination: Blood pressure 113/74, pulse 90, temperature 98.1 F (36.7 C), temperature source Oral, resp. rate 18, height '5\' 10"'$  (1.778 m), weight 121.6 kg (268 lb), SpO2 100 %.  HEENT-  Normocephalic, no lesions, without obvious abnormality.  Normal external eye and conjunctiva.  Normal TM's bilaterally.  Normal auditory canals and external ears. Normal external nose, mucus membranes and septum.  Normal pharynx. Cardiovascular- S1, S2 normal, pulses palpable throughout   Lungs- chest clear, no wheezing, rales, normal symmetric air entry Abdomen- soft, non-tender; bowel sounds normal; no masses,  no organomegaly Extremities- no edema Lymph-no adenopathy palpable Musculoskeletal-no joint tenderness, deformity or swelling Skin-redness on lower extremities  Neurological Examination   Mental Status: Alert, oriented,  thought content appropriate.  Speech fluent without evidence of aphasia but some mild slurring.  Able to follow 3 step commands without difficulty. Cranial Nerves: II: Discs flat bilaterally; Visual fields grossly normal, pupils equal, round, reactive to light and accommodation III,IV, VI: ptosis not present, extra-ocular motions intact bilaterally V,VII: mild left facial droop, facial light touch sensation normal bilaterally VIII: hearing normal bilaterally IX,X: gag reflex present XI: bilateral shoulder shrug XII: midline tongue extension Motor: Right : Upper extremity   5/5    Left:     Upper extremity   4+/5  Lower extremity   5/5     Lower extremity   4+/5 Tone and bulk:normal  tone throughout; no atrophy noted Sensory: Pinprick and light touch intact throughout, bilaterally Deep Tendon Reflexes: 2+ in the upper extremities and absent in the lower extremities Plantars: Right: mute   Left: mute Cerebellar: Normal finger-to-nose and normal heel-to-shin testing bilaterally Gait: not tested due to safety concerns      Laboratory Studies:  Basic Metabolic Panel:  Recent Labs Lab 12/15/16 1233  NA 138  K 4.0  CL 106  CO2 24  GLUCOSE 61*  BUN 19  CREATININE 1.19  CALCIUM 9.2    Liver Function Tests: No results for input(s): AST, ALT, ALKPHOS, BILITOT, PROT, ALBUMIN in the last 168 hours. No results for input(s): LIPASE, AMYLASE in the last 168 hours. No results for input(s): AMMONIA in the last 168 hours.  CBC:  Recent Labs Lab 12/15/16 1233  WBC 10.6  HGB 12.4*  HCT 35.5*  MCV 83.7  PLT 203    Cardiac Enzymes: No results for input(s): CKTOTAL, CKMB, CKMBINDEX, TROPONINI in the last 168 hours.  BNP: Invalid input(s): POCBNP  CBG:  Recent Labs Lab 12/15/16 1411  GLUCAP 87    Microbiology: Results for orders placed or performed during the hospital encounter of 11/18/16  Culture, blood (Routine x 2)     Status: Abnormal   Collection Time: 11/18/16 11:28 PM  Result Value Ref Range Status   Specimen Description BLOOD LEFT ANTECUBITAL  Final   Special Requests   Final    BOTTLES DRAWN AEROBIC AND ANAEROBIC  AER [place] ANA 4CC   Culture  Setup Time   Final    GRAM POSITIVE COCCI IN BOTH AEROBIC AND ANAEROBIC BOTTLES CRITICAL VALUE NOTED.  VALUE IS CONSISTENT WITH PREVIOUSLY REPORTED AND CALLED VALUE.    Culture (A)  Final    STAPHYLOCOCCUS AUREUS SUSCEPTIBILITIES PERFORMED ON PREVIOUS CULTURE WITHIN THE LAST 5 DAYS. Performed at [facility], [place] [address], [place], [place] [address]    Report Status 11/21/2016 FINAL  Final  Culture, blood (Routine x 2)     Status: Abnormal   Collection Time: 11/18/16 11:28 PM  Result  Value Ref Range Status   Specimen Description BLOOD LEFT HAND  Final   Special Requests   Final    BOTTLES DRAWN AEROBIC AND ANAEROBIC  AER 2CC ANA [place]   Culture  Setup Time   Final    GRAM POSITIVE COCCI IN BOTH AEROBIC AND ANAEROBIC BOTTLES CRITICAL RESULT CALLED TO, READ BACK BY AND VERIFIED WITH: SHEENA HALLAJI AT 9147 ON 11/19/16 [place]. Performed at [facility], [place] [address] [place], [place] [address]    Culture STAPHYLOCOCCUS AUREUS (A)  Final   Report Status 11/21/2016 FINAL  Final   Organism ID, Bacteria STAPHYLOCOCCUS AUREUS  Final      Susceptibility   Staphylococcus aureus - MIC*  CIPROFLOXACIN <=0.5 SENSITIVE Sensitive     ERYTHROMYCIN <=0.25 SENSITIVE Sensitive     GENTAMICIN <=0.5 SENSITIVE Sensitive     OXACILLIN 0.5 SENSITIVE Sensitive     TETRACYCLINE <=1 SENSITIVE Sensitive     VANCOMYCIN 1 SENSITIVE Sensitive     TRIMETH/SULFA <=10 SENSITIVE Sensitive     CLINDAMYCIN <=0.25 SENSITIVE Sensitive     RIFAMPIN <=0.5 SENSITIVE Sensitive     Inducible Clindamycin NEGATIVE Sensitive     * STAPHYLOCOCCUS AUREUS  Urine culture     Status: Abnormal   Collection Time: 11/18/16 11:28 PM  Result Value Ref Range Status   Specimen Description URINE, RANDOM  Final   Special Requests NONE  Final   Culture (A)  Final    <10,000 COLONIES/mL INSIGNIFICANT GROWTH Performed at [facility], [address], [place], [place] [address]    Report Status 11/20/2016 FINAL  Final  Blood Culture ID Panel (Reflexed)     Status: Abnormal   Collection Time: 11/18/16 11:28 PM  Result Value Ref Range Status   Enterococcus species NOT DETECTED NOT DETECTED Final   Listeria monocytogenes NOT DETECTED NOT DETECTED Final   Staphylococcus species DETECTED (A) NOT DETECTED Final    Comment: CRITICAL RESULT CALLED TO, READ BACK BY AND VERIFIED WITH: SHEENA HALLAJI AT 6283 ON 11/19/16 [place].    Staphylococcus aureus DETECTED (A) NOT DETECTED Final    Comment: Methicillin (oxacillin)  susceptible Staphylococcus aureus (MSSA). Preferred therapy is anti staphylococcal beta lactam antibiotic (Cefazolin or Nafcillin), unless clinically contraindicated. CRITICAL RESULT CALLED TO, READ BACK BY AND VERIFIED WITH: SHEENA HALLAJI AT 1517 ON 11/19/16 [place].    Methicillin resistance NOT DETECTED NOT DETECTED Final   Streptococcus species NOT DETECTED NOT DETECTED Final   Streptococcus agalactiae NOT DETECTED NOT DETECTED Final   Streptococcus pneumoniae NOT DETECTED NOT DETECTED Final   Streptococcus pyogenes NOT DETECTED NOT DETECTED Final   Acinetobacter baumannii NOT DETECTED NOT DETECTED Final   Enterobacteriaceae species NOT DETECTED NOT DETECTED Final   Enterobacter cloacae complex NOT DETECTED NOT DETECTED Final   Escherichia coli NOT DETECTED NOT DETECTED Final   Klebsiella oxytoca NOT DETECTED NOT DETECTED Final   Klebsiella pneumoniae NOT DETECTED NOT DETECTED Final   Proteus species NOT DETECTED NOT DETECTED Final   Serratia marcescens NOT DETECTED NOT DETECTED Final   Haemophilus influenzae NOT DETECTED NOT DETECTED Final   Neisseria meningitidis NOT DETECTED NOT DETECTED Final   Pseudomonas aeruginosa NOT DETECTED NOT DETECTED Final   Candida albicans NOT DETECTED NOT DETECTED Final   Candida glabrata NOT DETECTED NOT DETECTED Final   Candida krusei NOT DETECTED NOT DETECTED Final   Candida parapsilosis NOT DETECTED NOT DETECTED Final   Candida tropicalis NOT DETECTED NOT DETECTED Final  MRSA PCR Screening     Status: None   Collection Time: 11/19/16  3:50 AM  Result Value Ref Range Status   MRSA by PCR NEGATIVE NEGATIVE Final    Comment:        The GeneXpert MRSA Assay (FDA approved for NASAL specimens only), is one component of a comprehensive MRSA colonization surveillance program. It is not intended to diagnose MRSA infection nor to guide or monitor treatment for MRSA infections.   Culture, blood (Routine X 2) w Reflex to ID Panel     Status: None    Collection Time: 11/20/16  4:22 PM  Result Value Ref Range Status   Specimen Description BLOOD LEFT ASSIST CONTROL  Final   Special Requests  Final    BOTTLES DRAWN AEROBIC AND ANAEROBIC ANA11ML AER14ML   Culture NO GROWTH 5 DAYS  Final   Report Status 11/25/2016 FINAL  Final  Culture, blood (Routine X 2) w Reflex to ID Panel     Status: None   Collection Time: 11/20/16  4:28 PM  Result Value Ref Range Status   Specimen Description BLOOD RIGHT ASSIST CONTROL  Final   Special Requests   Final    BOTTLES DRAWN AEROBIC AND ANAEROBIC AER12ML ANA11ML   Culture NO GROWTH 5 DAYS  Final   Report Status 11/25/2016 FINAL  Final    Coagulation Studies:  Recent Labs  12/15/16 1300  LABPROT 15.0  INR 1.17    Urinalysis: No results for input(s): COLORURINE, LABSPEC, PHURINE, GLUCOSEU, HGBUR, BILIRUBINUR, KETONESUR, PROTEINUR, UROBILINOGEN, NITRITE, LEUKOCYTESUR in the last 168 hours.  Invalid input(s): APPERANCEUR  Lipid Panel: No results found for: CHOL, TRIG, HDL, CHOLHDL, VLDL, LDLCALC  HgbA1C: No results found for: HGBA1C  Urine Drug Screen:  No results found for: LABOPIA, COCAINSCRNUR, LABBENZ, AMPHETMU, THCU, LABBARB  Alcohol Level: No results for input(s): ETH in the last 168 hours.  Other results: EKG: atrial fibrillation, rate 77 bpm.  Imaging: Ct Head Wo Contrast  Result Date: 12/15/2016 CLINICAL DATA:  71 year old male with slurred speech and left arm weakness since 4 a.m. today. Two recent episodes of sepsis since December 2017. Diaphoresis. EXAM: CT HEAD WITHOUT CONTRAST TECHNIQUE: Contiguous axial images were obtained from the base of the skull through the vertex without intravenous contrast. COMPARISON:  Head CT 10/02/2016. FINDINGS: Brain: Patchy and confluent areas of decreased attenuation are noted throughout the deep and periventricular white matter of the cerebral hemispheres bilaterally, compatible with chronic microvascular ischemic disease. No evidence of acute  infarction, hemorrhage, hydrocephalus, extra-axial collection or mass lesion/mass effect. Vascular: No hyperdense vessel or unexpected calcification. Skull: Normal. Negative for fracture or focal lesion. Sinuses/Orbits: No acute finding. Other: None. IMPRESSION: 1. No acute intracranial abnormalities. 2. Chronic microvascular ischemic changes in the cerebral white matter. Electronically Signed   By: Vinnie Langton M.D.   On: 12/15/2016 13:04    Assessment: 71 y.o. male with a history of endocarditis and atrial fibrillation on Coumadin who presents with complaints of left sided weakness. Head CT reviewed and shows no acute changes.  Patient with subtherapeutic INR and although not a candidate for tPA may require intervention.  Further work up recommended.    Stroke Risk Factors - atrial fibrillation, diabetes mellitus, hyperlipidemia, hypertension and endocarditis  Plan: 1. CTA of head and neck with determination to be made as to need of transfer based upon presence of LVO.   2. MRI of the brain without contrast with risk of bridging to be determined with these results.   3. PT consult, OT consult, Speech consult 4. Prophylactic therapy-Continue Coumadin with target INR between 2 and 3 5. NPO until RN stroke swallow screen 6. Telemetry monitoring 7. Frequent neuro checks 8. HgbA1c, fasting lipid panel    Alexis Goodell, MD Neurology [phone] 12/15/2016, 2:31 PM

## 2016-12-15 NOTE — ED Triage Notes (Addendum)
Weakness that pt noticed upon awakening today at 400AM. Left sided weakness. Pt felt like he was going to fall this AM but sat down in chair to avoid fall. Pt able to move all extremities but left side is slightly weaker upon exam, both arm and leg. Pt arrives with PICC line in place to right side, gets 24 hours antibiotics for infection including endocarditis. Pt alert and oriented X 4 at this time. Pt in NAD.   CBG 93

## 2016-12-15 NOTE — ED Notes (Signed)
No Lovenox on ER pyxis. Awaiting pharmacy to send medication.

## 2016-12-15 NOTE — ED Notes (Signed)
ED Provider at bedside. 

## 2016-12-15 NOTE — ED Notes (Addendum)
Pt given 8 oz orange juice due to glucose of 61.

## 2016-12-16 ENCOUNTER — Inpatient Hospital Stay
Admit: 2016-12-16 | Discharge: 2016-12-16 | Disposition: A | Discharge status: Home / Self Care | Payer: PPO | Attending: Infectious Diseases | Admitting: Infectious Diseases

## 2016-12-16 DIAGNOSIS — I639 Cerebral infarction, unspecified: Secondary | ICD-10-CM | POA: Diagnosis not present

## 2016-12-16 DIAGNOSIS — I330 Acute and subacute infective endocarditis: Secondary | ICD-10-CM | POA: Diagnosis not present

## 2016-12-16 DIAGNOSIS — I10 Essential (primary) hypertension: Secondary | ICD-10-CM | POA: Diagnosis not present

## 2016-12-16 DIAGNOSIS — I4891 Unspecified atrial fibrillation: Secondary | ICD-10-CM | POA: Diagnosis not present

## 2016-12-16 LAB — HEPARIN LEVEL (UNFRACTIONATED)
HEPARIN UNFRACTIONATED: 0.13 [IU]/mL — AB (ref 0.30–0.70)
HEPARIN UNFRACTIONATED: 0.34 [IU]/mL (ref 0.30–0.70)
Heparin Unfractionated: 0.26 IU/mL — ABNORMAL LOW (ref 0.30–0.70)

## 2016-12-16 LAB — PROTIME-INR
INR: 1.2
Prothrombin Time: 15.3 seconds — ABNORMAL HIGH (ref 11.4–15.2)

## 2016-12-16 LAB — GLUCOSE, CAPILLARY
GLUCOSE-CAPILLARY: 89 mg/dL (ref 65–99)
Glucose-Capillary: 104 mg/dL — ABNORMAL HIGH (ref 65–99)

## 2016-12-16 MED ORDER — INSULIN ASPART 100 UNIT/ML ~~LOC~~ SOLN
0.0000 [IU] | Freq: Three times a day (TID) | SUBCUTANEOUS | Status: DC
  Administered 2016-12-17 (×2): 1 [IU] via SUBCUTANEOUS
  Administered 2016-12-17: 2 [IU] via SUBCUTANEOUS
  Administered 2016-12-18: 08:00:00 1 [IU] via SUBCUTANEOUS
  Administered 2016-12-18: 12:00:00 2 [IU] via SUBCUTANEOUS
  Filled 2016-12-16: qty 2
  Filled 2016-12-16 (×2): qty 1
  Filled 2016-12-16: qty 2
  Filled 2016-12-16: qty 1

## 2016-12-16 MED ORDER — INSULIN ASPART 100 UNIT/ML ~~LOC~~ SOLN: 0.0000 [IU] | Freq: Every day | SUBCUTANEOUS | Status: DC

## 2016-12-16 MED ORDER — HEPARIN BOLUS VIA INFUSION
1500.0000 [IU] | Freq: Once | INTRAVENOUS | Status: AC
  Administered 2016-12-16: 21:00:00 1500 [IU] via INTRAVENOUS
  Filled 2016-12-16: qty 1500

## 2016-12-16 NOTE — Progress Notes (Signed)
Inpatient Diabetes Program Recommendations  AACE/ADA: New Consensus Statement on Inpatient Glycemic Control (2015)  Target Ranges:  Prepandial:   less than 140 mg/dL      Peak postprandial:   less than 180 mg/dL (1-2 hours)      Critically ill patients:  140 - 180 mg/dL  Results for CHARELS, STAMBAUGH (MRN [id number]) as of 12/16/2016 10:27  Ref. Range 12/15/2016 14:11  Glucose-Capillary Latest Ref Range: 65 - 99 mg/dL 87  Results for REQUAN, HARDGE (MRN [id number]) as of 12/16/2016 10:27  Ref. Range 12/15/2016 12:33  Glucose Latest Ref Range: 65 - 99 mg/dL 61 (L)    Review of Glycemic Control  Diabetes history: DM2 Outpatient Diabetes medications: Amaryl 2 mg daily, Metformin 1000 mg BID Current orders for Inpatient glycemic control: Amaryl 2 mg daily, Metformin 1000 mg BID  Inpatient Diabetes Program Recommendations: Correction (SSI): While inpatient, please consider ordering CBGs with Novolog correction scale ACHS. Outpatient Referral: While inpatient, please discontinue oral DM medications (Amaryl and Metformin).  Thanks, [name] Alderman, RN, MSN, CDE Diabetes Coordinator Inpatient Diabetes Program [phone] (Team Pager from 8am to 5pm)

## 2016-12-16 NOTE — Progress Notes (Signed)
[facility]  Patient Status: Active  [facility] is providing the following services: SN/PT/IV ABX (Nafcillin 12g q24 until 3/16)  If patient discharges after hours, please call [phone].   [name] 12/16/2016, 4:09 PM

## 2016-12-16 NOTE — Evaluation (Signed)
Physical Therapy Evaluation Patient Details Name: [name] MRN: [id number] DOB: 07-Oct-1946 Today's Date: 12/16/2016   History of Present Illness  Pt is a pleasant 71 yo male, pt admitted to [facility] w/ L sided weakness and slurred speech, MR Imaging positive for stroke. Pt was admitted to [facility] critical care earlier this month (11/19/16) for septic shock and respiratory failure. PMH includes; mitral and aortic valve replacement, chronic A-fib, CAD, diabetes, HTN, and polyneuropathy.     Clinical Impression  Pt awake alert and willing to participate in PT eval. Stated he feels about 85% compared to baseline w/ complaints of L UE giving way when gripping. L UE strength grossly 4/5 and slightly weaker then R UE, he also displayed diminished light touch sensation of L UE compared to R. Coordination also slightly impaired on L compared to R w/ minimal dysmetria during finger to nose test. Overall patient displayed good safety awareness and strength for functional tasks and able to transfer and ambulate using a RW. Pt ambulated around nursing station under PT supervision w/ stable vital signs and no increase in WOB and displayed no LOB. His L hand would occasionally slip off the RW during ambulation, pt stated this is unusual for him but able to self correct. Scored 10/12 on modified DGI w/ RW indicative of low fall risk Pt is functionally safe for basic household mobility and presents w/ decreased coordintation, sensation and strength of the L UE with previous cardiopulmonary impairments. He will benefit from skilled PT to correct deficits and improve functional mobility. Pt has currently been receiving HHPT for cardiopulmonary impairments and recommend he continue HHPT following acute hospital stay.      Follow Up Recommendations Home health PT    Equipment Recommendations  None recommended by PT    Recommendations for Other Services       Precautions / Restrictions Precautions Precautions:  Fall Restrictions Weight Bearing Restrictions: No      Mobility  Bed Mobility               General bed mobility comments: not assessed pt seated in recliner   Transfers Overall transfer level: Modified independent Equipment used: Rolling walker (2 wheeled) Transfers: Sit to/from Stand Sit to Stand: Modified independent (Device/Increase time)         General transfer comment: good safety awareness throughout transfer and hand placement on side of chair, use of B UE's for push off, good feet positioning, stated he has been improving in transfers w/ HHPT  Ambulation/Gait Ambulation/Gait assistance: Supervision Ambulation Distance (Feet): 200 Feet Assistive device: Rolling walker (2 wheeled) Gait Pattern/deviations: Trunk flexed;Step-through pattern     General Gait Details: ambulated w/ reciprocal gait pattern, no LOB or staggering, vitals remained stable w/ no signs of labored breathing, good safety awareness w/ RW, able to speed up and slow down w/o LOB  Stairs            Wheelchair Mobility    Modified Rankin (Stroke Patients Only)       Balance Overall balance assessment: History of Falls;Needs assistance Sitting-balance support: No upper extremity supported;Feet supported Sitting balance-Leahy Scale: Good Sitting balance - Comments: able to independently sit w/o back support    Standing balance support: Bilateral upper extremity supported;During functional activity Standing balance-Leahy Scale: Good Standing balance comment: Requires use of RW for additional stability UE tends to give way w/ decreased grip strength on the RW  Standardized Balance Assessment Standardized Balance Assessment : Dynamic Gait Index           Pertinent Vitals/Pain Faces Pain Scale: Hurts a little bit Pain Location: feet Pain Descriptors / Indicators: Burning Pain Intervention(s): Monitored during session;Repositioned    Home Living  Family/patient expects to be discharged to:: Private residence Living Arrangements: Spouse/significant other Available Help at Discharge: Neighbor;Family;Available PRN/intermittently Type of Home: House Home Access: Stairs to enter Entrance Stairs-Rails: Right Entrance Stairs-Number of Steps: 4 Home Layout: One level Home Equipment: Walker - 2 wheels;Cane - single point;Grab bars - toilet;Grab bars - tub/shower;Hand held shower head;Adaptive equipment;Shower seat Additional Comments: Son recently renovated bathroom handicapped accessible per previous report by OT    Prior Function Level of Independence: Independent with assistive device(s)   Gait / Transfers Assistance Needed: use of RW for ambulation   ADL's / Homemaking Assistance Needed: defer to OT  Comments: Pt stated he is able to transfer and ambulate w/ modified independence, able to ambulate household distances w/ RW prior to admittance      Hand Dominance   Dominant Hand: Right    Extremity/Trunk Assessment   Upper Extremity Assessment Upper Extremity Assessment: LUE deficits/detail LUE Deficits / Details: decreased grip strength 4-/5, Overall UE stregnth slightly decreaed at least 4/5, L UE occasionally gives way w/ gripping of phone or walker, slightly impaired finger to nose compared to R side LUE Sensation: decreased light touch LUE Coordination: decreased fine motor    Lower Extremity Assessment Lower Extremity Assessment: RLE deficits/detail RLE Deficits / Details: decreased Light touch of lower leg, ankle and foot secondary to neuropathy that is present at baseline, proximal Light touch intact and same as contralateral limb  RLE Sensation: decreased light touch       Communication   Communication: No difficulties  Cognition Arousal/Alertness: Awake/alert Behavior During Therapy: WFL for tasks assessed/performed Overall Cognitive Status: Within Functional Limits for tasks assessed                       General Comments General comments (skin integrity, edema, etc.): modified DGI 10/12 w/ RW indicating low fall risk     Exercises     Assessment/Plan    PT Assessment Patient needs continued PT services  PT Problem List Decreased strength;Decreased balance;Decreased coordination;Impaired sensation;Cardiopulmonary status limiting activity       PT Treatment Interventions DME instruction;Gait training;Neuromuscular re-education;Therapeutic exercise;Therapeutic activities;Stair training;Functional mobility training;Patient/family education;Balance training    PT Goals (Current goals can be found in the Care Plan section)  Acute Rehab PT Goals Patient Stated Goal: Return home and regain strength PT Goal Formulation: With patient Time For Goal Achievement: 12/30/16 Potential to Achieve Goals: Good    Frequency 7X/week   Barriers to discharge        Co-evaluation               End of Session Equipment Utilized During Treatment: Gait belt Activity Tolerance: Patient tolerated treatment well Patient left: in chair;with call bell/phone within reach;with chair alarm set Nurse Communication: Mobility status PT Visit Diagnosis: Muscle weakness (generalized) (M62.81);Hemiplegia and hemiparesis Hemiplegia - Right/Left: Left Hemiplegia - dominant/non-dominant: Non-dominant Hemiplegia - caused by: Cerebral infarction         Time: 2130-8657 PT Time Calculation (min) (ACUTE ONLY): 24 min   Charges:         PT G Codes:         [employer] Student PT 12/16/2016, 11:24 AM

## 2016-12-16 NOTE — Progress Notes (Signed)
ANTICOAGULATION CONSULT NOTE - Follow Up Consult  Pharmacy Consult for heparin and warfarin dosing Indication: atrial fibrillation and possible cardioembolic stroke  Allergies  Allergen Reactions  . Rofecoxib Other (See Comments)    ulcers  . Tetracyclines & Related     Patient Measurements: Height: '5\' 10"'$  (177.8 cm) Weight: 268 lb (121.6 kg) IBW/kg (Calculated) : 73 Heparin Dosing Weight: 100 kg  Vital Signs: Temp: 98.3 F (36.8 C) (02/28 0156) Temp Source: Oral (02/28 0156) BP: 125/72 (02/28 0156) Pulse Rate: 75 (02/28 0156)  Labs:  Recent Labs  12/15/16 1233 12/15/16 1300 12/15/16 1917 12/16/16 0227  HGB 12.4*  --   --   --   HCT 35.5*  --   --   --   PLT 203  --   --   --   APTT  --   --  29  --   LABPROT  --  15.0  --  15.3*  INR  --  1.17  --  1.20  HEPARINUNFRC  --   --   --  0.13*  CREATININE 1.19  --   --   --     Estimated Creatinine Clearance: 75.5 mL/min (by C-G formula based on SCr of 1.19 mg/dL).   Medications:  Scheduled:  . amiodarone  200 mg Oral Daily  . aspirin EC  81 mg Oral Daily  . DULoxetine  60 mg Oral QHS  . feeding supplement (ENSURE ENLIVE)  237 mL Oral BID BM  . furosemide  40 mg Oral Daily  . gabapentin  1,200 mg Oral TID  . glimepiride  2 mg Oral Daily  . lisinopril  10 mg Oral Daily  . loratadine  10 mg Oral Daily  . metFORMIN  1,000 mg Oral BID WC  . methadone  5 mg Oral BID  . metoprolol tartrate  25 mg Oral BID  . multivitamin with minerals  1 tablet Oral Daily  . Oxcarbazepine  300 mg Oral BID  . pantoprazole  40 mg Oral Daily  . pravastatin  40 mg Oral QHS  . rifampin  300 mg Oral Q8H  . vitamin B-12  1,000 mcg Oral Daily  . warfarin  10 mg Oral q1800  . Warfarin - Physician Dosing Inpatient   Does not apply q1800   Infusions:  . heparin 1,400 Units/hr (12/15/16 2014)  . sodium chloride 0.9 % 250 mL with nafcillin 12 g infusion 12 g (12/15/16 1845)    Assessment: Pt is 22 yoM with afib on warfarin PTA  admitted with possible cardioembolic stroke with mitral valve replacement in 2014. 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 INR today is 1.17. Per PTA med list, his warfarin dose was increased from 5 mg daily to 10 mg daily by Dr. Nehemiah Massed on 2/26. Will begin bridge with heparin to therapeutic INR (2.5 - 3.5). Baseline labs ordered. PT/INR ordered daily.  Goal of Therapy:  INR 2.5 - 3.5 Heparin level goal: 0.3 - 0.5 (CVA goal) Monitor platelets by anticoagulation protocol: Yes   Plan:  Begin heparin 1400 u/hr with no bolus. Will check antiXa level in 6 hours. Will begin patient at recently modified warfarin dose of 10 mg  Date  INR  Dose 2/27  1.17  10 mg   2/28 0227 heparin level subtherapeutic. Increase rate to 1700 units/hr. Will recheck HL in 8 hours.  Shravya Wickwire A. Jordan Hawks, PharmD, BCPS Clinical Pharmacist  12/16/2016 3:29 AM

## 2016-12-16 NOTE — Progress Notes (Signed)
[facility] NAME: [name]    MR#:  [id number]  DATE OF BIRTH:  08/28/1946  SUBJECTIVE:   Patient here due to slurred speech, left-sided weakness and noted to have an acute CVA. Patient's MRI is showing Punctate focus of acute ischemia within the superior left parietal lobe and linear area of acute to early subacute ischemia along the right external capsule.  Slurred speech much improved. No focal weakness on exam. 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  REVIEW OF SYSTEMS:    Review of Systems  Constitutional: Negative for chills and fever.  HENT: Negative for congestion and tinnitus.   Eyes: Negative for blurred vision and double vision.  Respiratory: Negative for cough, shortness of breath and wheezing.   Cardiovascular: Negative for chest pain, orthopnea and PND.  Gastrointestinal: Negative for abdominal pain, diarrhea, nausea and vomiting.  Genitourinary: Negative for dysuria and hematuria.  Neurological: Negative for dizziness, sensory change and focal weakness.  All other systems reviewed and are negative.   Nutrition: Heart Healthy Tolerating Diet: Yes Tolerating PT: Await Eval    DRUG ALLERGIES:   Allergies  Allergen Reactions  . Rofecoxib Other (See Comments)    ulcers  . Tetracyclines & Related     VITALS:  Blood pressure 125/73, pulse 90, temperature 97.9 F (36.6 C), temperature source Oral, resp. rate 20, height '5\' 10"'$  (1.778 m), weight 121.6 kg (268 lb), SpO2 100 %.  PHYSICAL EXAMINATION:   Physical Exam  GENERAL:  71 y.o.-year-old patient sitting up in chair in no acute distress.  EYES: Pupils equal, round, reactive to light and accommodation. No scleral icterus. Extraocular muscles intact.  HEENT: Head atraumatic, normocephalic. Oropharynx and nasopharynx clear.  NECK:  Supple, no jugular venous distention. No thyroid enlargement, no tenderness.  LUNGS: Normal breath sounds  bilaterally, no wheezing, rales, rhonchi. No use of accessory muscles of respiration.  CARDIOVASCULAR: S1, S2 normal. No murmurs, rubs, or gallops.  ABDOMEN: Soft, nontender, nondistended. Bowel sounds present. No organomegaly or mass.  EXTREMITIES: No cyanosis, clubbing or edema b/l.    NEUROLOGIC: Cranial nerves II through XII are intact. No focal Motor or sensory deficits b/l.   PSYCHIATRIC: The patient is alert and oriented x 3.  SKIN: No obvious rash, lesion, or ulcer.    LABORATORY PANEL:   CBC  Recent Labs Lab 12/15/16 1233  WBC 10.6  HGB 12.4*  HCT 35.5*  PLT 203   ------------------------------------------------------------------------------------------------------------------  Chemistries   Recent Labs Lab 12/15/16 1233  NA 138  K 4.0  CL 106  CO2 24  GLUCOSE 61*  BUN 19  CREATININE 1.19  CALCIUM 9.2   ------------------------------------------------------------------------------------------------------------------  Cardiac Enzymes No results for input(s): TROPONINI in the last 168 hours. ------------------------------------------------------------------------------------------------------------------  RADIOLOGY:  Ct Angio Head W Or Wo Contrast  Addendum Date: 12/15/2016   ADDENDUM REPORT: 12/15/2016 15:16 ADDENDUM: Study discussed by telephone with Dr. Fritzi Mandes on 12/15/2016 at 1503 hours. Electronically Signed   By: Genevie Ann M.D.   On: 12/15/2016 15:16   Result Date: 12/15/2016 CLINICAL DATA:  71 year old male with left side weakness since 0400 hours today. Initial encounter. EXAM: CT ANGIOGRAPHY HEAD AND NECK TECHNIQUE: Multidetector CT imaging of the head and neck was performed using the standard protocol during bolus administration of intravenous contrast. Multiplanar CT image reconstructions and MIPs were obtained to evaluate the vascular anatomy. Carotid stenosis measurements (when applicable) are obtained utilizing NASCET criteria, using the distal  internal carotid  diameter as the denominator. CONTRAST:  75 mL Isovue 370 COMPARISON:  Head CT without contrast 1256 hours today. Brain MRI with intracranial MRA 03/07/2008 FINDINGS: CTA NECK Skeleton: Extensive previous cervical spine ACDF. Previous median sternotomy. No acute osseous abnormality identified. Visualized paranasal sinuses and mastoids are stable and well pneumatized. Upper chest: Incidental azygos fissure. Mild mosaic attenuation in the visualized bilateral lung parenchyma. No superior mediastinal lymphadenopathy. Other neck: Thyroid, larynx, pharynx, parapharyngeal spaces, retropharyngeal space, sublingual space, submandibular glands and parotid glands are within normal limits. No cervical lymphadenopathy. Aortic arch: Bovine type arch configuration. Mild to moderate aortic arch calcified atherosclerosis. Right carotid system: No brachiocephalic or right CCA origin stenosis despite soft and calcified plaque. Mildly tortuous proximal right ICA. Widely patent right carotid bifurcation with minimal soft plaque. Mildly tortuous cervical right ICA. Left carotid system: Bovine type left CCA origin with no origin stenosis. Mild circumferential soft plaque in the left CCA. Widely patent left carotid bifurcation. Mild left ICA bulb soft and calcified plaque without stenosis. Mildly tortuous cervical left ICA. Vertebral arteries:Mild tortuosity of the proximal right subclavian artery with soft and calcified plaque but no stenosis. Calcified plaque at the right vertebral artery origin with mild to moderate origin stenosis (Series 7, image 167). Otherwise negative right vertebral artery to the skullbase. No significant proximal left subclavian artery stenosis despite soft and calcified plaque. Calcified plaque at the left vertebral artery origin with severe origin stenosis (series 8, image 137). The left vertebral artery is mildly non dominant and otherwise negative to the skullbase. CTA HEAD Posterior  circulation: Dominant distal right vertebral artery with no stenosis to the vertebrobasilar junction. Normal right PICA origin. Calcified plaque in the non dominant distal left V4 segment distal to the low PICA origin which remains normal. Subsequent moderate distal left vertebral stenosis, but the vessel remains patent to the vertebrobasilar junction. No basilar stenosis. Patent SCA and PCA origins. Both posterior communicating arteries are present but small. Right PCA branches are normal. There is mild irregularity without significant stenosis in the left P1 segment. Left PCA branches are within normal limits. Anterior circulation: Both ICA siphons are patent. Minimal siphon calcified plaque without stenosis. Normal bilateral ophthalmic and posterior communicating artery origins. Patent carotid termini. Normal MCA and ACA origins. Anterior communicating artery and bilateral ACA branches are normal with a prominent median artery of the corpus callosum (normal variant). Left MCA M1 segment, trifurcation, and left MCA branches are normal. Right MCA M1 segment, trifurcation, and proximal M2 branches are normal. There is a severe stenosis at the bifurcation of a posterior right MCA M2 branch best seen on series 8, image 61 (also series 11, image 14 and series 6, image 100), but the downstream branches remain patent. Venous sinuses: Patent. Anatomic variants: Dominant right vertebral artery. Median artery of the corpus callosum. Bovine type aortic arch configuration. Delayed phase: Small age indeterminate cortically based infarct at the right sensory strip (series 12, image 24), might have been present in December 2017. Otherwise no cortically based infarct is evident in the right MCA territory. No acute intracranial hemorrhage identified. No intracranial mass effect. No abnormal enhancement identified. Review of the MIP images confirms the above findings IMPRESSION: 1. Negative for emergent large vessel occlusion but  positive for filling defect at the bifurcation of a right MCA middle M2 branch. Despite this severe stenosis the downstream M3 branches remain patent. 2. Otherwise mild for age intra- and extracranial carotid and anterior circulation atherosclerosis. 3. Dominant right vertebral artery with  up to moderate stenosis at its origin. Severe stenosis of the non dominant left vertebral artery at both its origin and distal V4 segment. The left PICA remains patent. 4. Mild atherosclerosis without stenosis of the left PCA P1 segment. 5. Small age indeterminate cortical infarct in the posterior right MCA territory sensory strip. No other right MCA territory ischemic changes by CT. 6. Mild mosaic attenuation in the upper lungs may indicate gas trapping. 7.  Calcified aortic atherosclerosis. Electronically Signed: By: Genevie Ann M.D. On: 12/15/2016 14:59   Ct Head Wo Contrast  Result Date: 12/15/2016 CLINICAL DATA:  71 year old male with slurred speech and left arm weakness since 4 a.m. today. Two recent episodes of sepsis since December 2017. Diaphoresis. EXAM: CT HEAD WITHOUT CONTRAST TECHNIQUE: Contiguous axial images were obtained from the base of the skull through the vertex without intravenous contrast. COMPARISON:  Head CT 10/02/2016. FINDINGS: Brain: Patchy and confluent areas of decreased attenuation are noted throughout the deep and periventricular white matter of the cerebral hemispheres bilaterally, compatible with chronic microvascular ischemic disease. No evidence of acute infarction, hemorrhage, hydrocephalus, extra-axial collection or mass lesion/mass effect. Vascular: No hyperdense vessel or unexpected calcification. Skull: Normal. Negative for fracture or focal lesion. Sinuses/Orbits: No acute finding. Other: None. IMPRESSION: 1. No acute intracranial abnormalities. 2. Chronic microvascular ischemic changes in the cerebral white matter. Electronically Signed   By: Vinnie Langton M.D.   On: 12/15/2016 13:04    Ct Angio Neck W Or Wo Contrast  Addendum Date: 12/15/2016   ADDENDUM REPORT: 12/15/2016 15:16 ADDENDUM: Study discussed by telephone with Dr. Fritzi Mandes on 12/15/2016 at 1503 hours. Electronically Signed   By: Genevie Ann M.D.   On: 12/15/2016 15:16   Result Date: 12/15/2016 CLINICAL DATA:  71 year old male with left side weakness since 0400 hours today. Initial encounter. EXAM: CT ANGIOGRAPHY HEAD AND NECK TECHNIQUE: Multidetector CT imaging of the head and neck was performed using the standard protocol during bolus administration of intravenous contrast. Multiplanar CT image reconstructions and MIPs were obtained to evaluate the vascular anatomy. Carotid stenosis measurements (when applicable) are obtained utilizing NASCET criteria, using the distal internal carotid diameter as the denominator. CONTRAST:  75 mL Isovue 370 COMPARISON:  Head CT without contrast 1256 hours today. Brain MRI with intracranial MRA 03/07/2008 FINDINGS: CTA NECK Skeleton: Extensive previous cervical spine ACDF. Previous median sternotomy. No acute osseous abnormality identified. Visualized paranasal sinuses and mastoids are stable and well pneumatized. Upper chest: Incidental azygos fissure. Mild mosaic attenuation in the visualized bilateral lung parenchyma. No superior mediastinal lymphadenopathy. Other neck: Thyroid, larynx, pharynx, parapharyngeal spaces, retropharyngeal space, sublingual space, submandibular glands and parotid glands are within normal limits. No cervical lymphadenopathy. Aortic arch: Bovine type arch configuration. Mild to moderate aortic arch calcified atherosclerosis. Right carotid system: No brachiocephalic or right CCA origin stenosis despite soft and calcified plaque. Mildly tortuous proximal right ICA. Widely patent right carotid bifurcation with minimal soft plaque. Mildly tortuous cervical right ICA. Left carotid system: Bovine type left CCA origin with no origin stenosis. Mild circumferential soft plaque  in the left CCA. Widely patent left carotid bifurcation. Mild left ICA bulb soft and calcified plaque without stenosis. Mildly tortuous cervical left ICA. Vertebral arteries:Mild tortuosity of the proximal right subclavian artery with soft and calcified plaque but no stenosis. Calcified plaque at the right vertebral artery origin with mild to moderate origin stenosis (Series 7, image 167). Otherwise negative right vertebral artery to the skullbase. No significant proximal left subclavian artery stenosis  despite soft and calcified plaque. Calcified plaque at the left vertebral artery origin with severe origin stenosis (series 8, image 137). The left vertebral artery is mildly non dominant and otherwise negative to the skullbase. CTA HEAD Posterior circulation: Dominant distal right vertebral artery with no stenosis to the vertebrobasilar junction. Normal right PICA origin. Calcified plaque in the non dominant distal left V4 segment distal to the low PICA origin which remains normal. Subsequent moderate distal left vertebral stenosis, but the vessel remains patent to the vertebrobasilar junction. No basilar stenosis. Patent SCA and PCA origins. Both posterior communicating arteries are present but small. Right PCA branches are normal. There is mild irregularity without significant stenosis in the left P1 segment. Left PCA branches are within normal limits. Anterior circulation: Both ICA siphons are patent. Minimal siphon calcified plaque without stenosis. Normal bilateral ophthalmic and posterior communicating artery origins. Patent carotid termini. Normal MCA and ACA origins. Anterior communicating artery and bilateral ACA branches are normal with a prominent median artery of the corpus callosum (normal variant). Left MCA M1 segment, trifurcation, and left MCA branches are normal. Right MCA M1 segment, trifurcation, and proximal M2 branches are normal. There is a severe stenosis at the bifurcation of a posterior  right MCA M2 branch best seen on series 8, image 61 (also series 11, image 14 and series 6, image 100), but the downstream branches remain patent. Venous sinuses: Patent. Anatomic variants: Dominant right vertebral artery. Median artery of the corpus callosum. Bovine type aortic arch configuration. Delayed phase: Small age indeterminate cortically based infarct at the right sensory strip (series 12, image 24), might have been present in December 2017. Otherwise no cortically based infarct is evident in the right MCA territory. No acute intracranial hemorrhage identified. No intracranial mass effect. No abnormal enhancement identified. Review of the MIP images confirms the above findings IMPRESSION: 1. Negative for emergent large vessel occlusion but positive for filling defect at the bifurcation of a right MCA middle M2 branch. Despite this severe stenosis the downstream M3 branches remain patent. 2. Otherwise mild for age intra- and extracranial carotid and anterior circulation atherosclerosis. 3. Dominant right vertebral artery with up to moderate stenosis at its origin. Severe stenosis of the non dominant left vertebral artery at both its origin and distal V4 segment. The left PICA remains patent. 4. Mild atherosclerosis without stenosis of the left PCA P1 segment. 5. Small age indeterminate cortical infarct in the posterior right MCA territory sensory strip. No other right MCA territory ischemic changes by CT. 6. Mild mosaic attenuation in the upper lungs may indicate gas trapping. 7.  Calcified aortic atherosclerosis. Electronically Signed: By: Genevie Ann M.D. On: 12/15/2016 14:59   Mr Brain Wo Contrast  Result Date: 12/15/2016 CLINICAL DATA:  Infected endocarditis.  TIA. EXAM: MRI HEAD WITHOUT CONTRAST TECHNIQUE: Multiplanar, multiecho pulse sequences of the brain and surrounding structures were obtained without intravenous contrast. COMPARISON:  Head CT 12/15/2016 Brain MRI 03/07/2008 FINDINGS: Brain: There  is linear restricted diffusion along the right external capsule. There is an additional punctate focus of abnormal DWI within the superior left parietal lobe. There is multifocal hyperintense T2-weighted signal within the periventricular white matter, most often seen in the setting of chronic microvascular ischemia. No mass lesion or midline shift. No hydrocephalus or extra-axial fluid collection. The midline structures are normal. No age advanced or lobar predominant atrophy. Vascular: Major intracranial arterial and venous sinus flow voids are preserved. No evidence of chronic microhemorrhage or amyloid angiopathy. Skull and upper  cervical spine: The visualized skull base, calvarium, upper cervical spine and extracranial soft tissues are normal. Sinuses/Orbits: No fluid levels or advanced mucosal thickening. No mastoid effusion. Normal orbits. IMPRESSION: 1. Punctate focus of acute ischemia within the superior left parietal lobe and linear area of acute to early subacute ischemia along the right external capsule. The distribution of lesions is suggestive of a central cardio embolic process. No hemorrhage or mass effect. 2. Chronic microvascular ischemia. Electronically Signed   By: Ulyses Jarred M.D.   On: 12/15/2016 18:20     ASSESSMENT AND PLAN:   71 year old male with past medical history of chronic atrial fibrillation, recent history of aortic valve endocarditis, neuropathy, hypertension, hyperlipidemia, GERD, diabetes, who presented to the hospital due to slurred speech, left-sided facial droop and weakness in the left lower ext.   1. Acute CVA - this is the cause of patient's acute neurologic symptoms. Patient's MRI showed Punctate focus of acute ischemia within the superior left parietal lobe and linear area of acute to early subacute ischemia along the right external capsule. - likely cardioembolic given subtherapeutic INR.  - cont. Heparin gtt and Coumadin for now.  Have case management look  into DOAC and pt. Has been on Xarelto before but could not afford it.  - appreciate Neuro input.  Await PT eval. Much better today.   2. Hx of chronic a. Fib - rate controlled and cont. Metoprolol, Amiodarone - cont. Heparin gtt, coumadin.   3. DM Type II - will d/c Amaryl, Metformin.  - place on SSI for now. Appreciate Diabetes coordinator input.   4. Recent hx of Aortic Valve endocarditis - clinically not septic.  - cont. Rifampin.  Follows w/ Dr. Ola Spurr as outpatient.   5. Chronic Pain - cont. Methadone  6. Essential HTN - cont. Lisinopril, Metoprolol.   7. GERD - cont. Protonix.   8. Hyperlipidemia - cont. Pravachol.    9. Depression - cont. Cymbalta.    All the records are reviewed and case discussed with Care Management/Social Worker. Management plans discussed with the patient, family and they are in agreement.  CODE STATUS: Full Code  DVT Prophylaxis: Heparin gtt, Coumadin  TOTAL TIME TAKING CARE OF THIS PATIENT: 30 minutes.   POSSIBLE D/C IN 1-2 DAYS, DEPENDING ON CLINICAL CONDITION.   Henreitta Leber M.D on 12/16/2016 at 2:29 PM  Between 7am to 6pm - Pager - [phone]  After 6pm go to [email or web address] - Proofreader  [facility]  Office  [phone]  CC: Primary care physician; Idelle Crouch, MD

## 2016-12-16 NOTE — Consult Note (Signed)
[facility]     Reason for Consult:Prosthetic valve endocarditis   Referring Physician: Jeronimo Greaves Date of Admission:  12/15/2016   Active Problems:   TIA (transient ischemic attack)   CVA (cerebral vascular accident) [facility])   HPI: [name] is a 71 y.o. 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 He is currenlty on coumadin for A fib due to interaction between the rifampin and the xarelto he was on previously. His INR has been subtherapeutic. He has been seen by neurology   Past Medical History:  Diagnosis Date  . Chronic pain   . Coronary artery disease   . Diabetes mellitus without complication ([place])   . DJD (degenerative joint disease)   . GERD (gastroesophageal reflux disease)   . Hyperlipemia   . Hypertension   . Insomnia   . Neuropathy [facility])    Past Surgical History:  Procedure Laterality Date  . BACK SURGERY    . CARDIAC SURGERY    . JOINT REPLACEMENT    . TEE WITHOUT CARDIOVERSION N/A 10/08/2016   Procedure: TRANSESOPHAGEAL ECHOCARDIOGRAM (TEE);  Surgeon: Teodoro Spray, MD;  Location: [facility] ORS;  Service: Cardiovascular;  Laterality: N/A;  . TEE WITHOUT CARDIOVERSION N/A 11/23/2016   Procedure: TRANSESOPHAGEAL ECHOCARDIOGRAM (TEE);  Surgeon: Isaias Cowman, MD;  Location: [facility] ORS;  Service: Cardiovascular;  Laterality: N/A;   Social History  Substance Use Topics  . Smoking status: Never Smoker  . Smokeless tobacco: Never Used  . Alcohol use No   History reviewed. No pertinent family history.  Allergies:  Allergies  Allergen Reactions  . Rofecoxib Other (See Comments)    ulcers  . Tetracyclines & Related     Current antibiotics: Antibiotics Given (last 72 hours)    Date/Time Action Medication Dose   12/15/16 1705 Given   rifampin (RIFADIN) capsule 300 mg 300 mg    12/16/16 0031 Given   rifampin (RIFADIN) capsule 300 mg 300 mg   12/16/16 6578 Given   rifampin (RIFADIN) capsule 300 mg 300 mg   12/16/16 1717 Given   rifampin (RIFADIN) capsule 300 mg 300 mg      MEDICATIONS: . amiodarone  200 mg Oral Daily  . aspirin EC  81 mg Oral Daily  . DULoxetine  60 mg Oral QHS  . feeding supplement (ENSURE ENLIVE)  237 mL Oral BID BM  . furosemide  40 mg Oral Daily  . gabapentin  1,200 mg Oral TID  . insulin aspart  0-5 Units Subcutaneous QHS  . insulin aspart  0-9 Units Subcutaneous TID WC  . lisinopril  10 mg Oral Daily  . loratadine  10 mg Oral Daily  . methadone  5 mg Oral BID  . metoprolol tartrate  25 mg Oral BID  . multivitamin with minerals  1 tablet Oral Daily  . Oxcarbazepine  300 mg Oral BID  . pantoprazole  40 mg Oral Daily  . pravastatin  40 mg Oral QHS  . rifampin  300 mg Oral Q8H  . vitamin B-12  1,000 mcg Oral Daily  . warfarin  10 mg Oral q1800  . Warfarin - Physician Dosing Inpatient   Does not apply q1800    Review of Systems - 11 systems reviewed and negative per HPI   OBJECTIVE: Temp:  [97.9 F (36.6 C)-99.1 F (37.3 C)] 99.1 F (37.3 C) (02/28 1704) Pulse  Rate:  [63-90] 75 (02/28 1704) Resp:  [18-20] 20 (02/28 1704) BP: (104-137)/(58-81) 109/81 (02/28 1704) SpO2:  [97 %-100 %] 98 % (02/28 1704) Physical Exam  Constitutional: He is oriented to person, place, and time. He appears well-developed and well-nourished. No distress.  HENT: anicteric  Mouth/Throat: Oropharynx is clear and moist. No oropharyngeal exudate.  Cardiovascular: Normal rate, regular rhythm 2/6 sm  Pulmonary/Chest: Effort normal and breath sounds normal. No respiratory distress. He has no wheezes.  Abdominal: Soft. Bowel sounds are normal. He exhibits no distension. There is no tenderness.  Lymphadenopathy:  He has no cervical adenopathy.  Neurological: He is alert and oriented to person, place, and time. Strength intact except 4/5 LUE grip Skin:  Skin is warm and dry. No rash noted. No erythema.  Psychiatric: He has a normal mood and affect. His behavior is normal.     LABS: Results for orders placed or performed during the hospital encounter of 12/15/16 (from the past 48 hour(s))  Basic metabolic panel     Status: Abnormal   Collection Time: 12/15/16 12:33 PM  Result Value Ref Range   Sodium 138 135 - 145 mmol/L   Potassium 4.0 3.5 - 5.1 mmol/L   Chloride 106 101 - 111 mmol/L   CO2 24 22 - 32 mmol/L   Glucose, Bld 61 (L) 65 - 99 mg/dL   BUN 19 6 - 20 mg/dL   Creatinine, Ser 1.19 0.61 - 1.24 mg/dL   Calcium 9.2 8.9 - 10.3 mg/dL   GFR calc non Af Amer >60 >60 mL/min   GFR calc Af Amer >60 >60 mL/min    Comment: (NOTE) The eGFR has been calculated using the CKD EPI equation. This calculation has not been validated in all clinical situations. eGFR's persistently <60 mL/min signify possible Chronic Kidney Disease.    Anion gap 8 5 - 15  CBC     Status: Abnormal   Collection Time: 12/15/16 12:33 PM  Result Value Ref Range   WBC 10.6 3.8 - 10.6 K/uL   RBC 4.24 (L) 4.40 - 5.90 MIL/uL   Hemoglobin 12.4 (L) 13.0 - 18.0 g/dL   HCT 35.5 (L) 40.0 - 52.0 %   MCV 83.7 80.0 - 100.0 fL   MCH 29.2 26.0 - 34.0 pg   MCHC 34.9 32.0 - 36.0 g/dL   RDW 17.5 (H) 11.5 - 14.5 %   Platelets 203 150 - 440 K/uL  Urinalysis, Complete w Microscopic     Status: Abnormal   Collection Time: 12/15/16 12:33 PM  Result Value Ref Range   Color, Urine YELLOW (A) YELLOW   APPearance CLEAR (A) CLEAR   Specific Gravity, Urine 1.027 1.005 - 1.030   pH 5.0 5.0 - 8.0   Glucose, UA 150 (A) NEGATIVE mg/dL   Hgb urine dipstick NEGATIVE NEGATIVE   Bilirubin Urine NEGATIVE NEGATIVE   Ketones, ur NEGATIVE NEGATIVE mg/dL   Protein, ur 30 (A) NEGATIVE mg/dL   Nitrite NEGATIVE NEGATIVE   Leukocytes, UA NEGATIVE NEGATIVE   RBC / HPF 0-5 0 - 5 RBC/hpf   WBC, UA 0-5 0 - 5 WBC/hpf   Bacteria, UA NONE SEEN NONE SEEN   Squamous Epithelial / LPF 0-5 (A) NONE SEEN    Mucous PRESENT    Hyaline Casts, UA PRESENT    Amorphous Crystal PRESENT   Protime-INR     Status: None   Collection Time: 12/15/16  1:00 PM  Result Value Ref Range   Prothrombin Time 15.0 11.4 - 15.2  seconds   INR 1.17   Glucose, capillary     Status: None   Collection Time: 12/15/16  2:11 PM  Result Value Ref Range   Glucose-Capillary 87 65 - 99 mg/dL  APTT     Status: None   Collection Time: 12/15/16  7:17 PM  Result Value Ref Range   aPTT 29 24 - 36 seconds  Protime-INR     Status: Abnormal   Collection Time: 12/16/16  2:27 AM  Result Value Ref Range   Prothrombin Time 15.3 (H) 11.4 - 15.2 seconds   INR 1.20   Heparin level (unfractionated)     Status: Abnormal   Collection Time: 12/16/16  2:27 AM  Result Value Ref Range   Heparin Unfractionated 0.13 (L) 0.30 - 0.70 IU/mL    Comment:        IF HEPARIN RESULTS ARE BELOW EXPECTED VALUES, AND PATIENT DOSAGE HAS BEEN CONFIRMED, SUGGEST FOLLOW UP TESTING OF ANTITHROMBIN III LEVELS.   Heparin level (unfractionated)     Status: None   Collection Time: 12/16/16 11:55 AM  Result Value Ref Range   Heparin Unfractionated 0.34 0.30 - 0.70 IU/mL    Comment:        IF HEPARIN RESULTS ARE BELOW EXPECTED VALUES, AND PATIENT DOSAGE HAS BEEN CONFIRMED, SUGGEST FOLLOW UP TESTING OF ANTITHROMBIN III LEVELS.   Glucose, capillary     Status: Abnormal   Collection Time: 12/16/16  5:02 PM  Result Value Ref Range   Glucose-Capillary 104 (H) 65 - 99 mg/dL   No components found for: ESR, C REACTIVE PROTEIN MICRO: Recent Results (from the past 720 hour(s))  Culture, blood (Routine x 2)     Status: Abnormal   Collection Time: 11/18/16 11:28 PM  Result Value Ref Range Status   Specimen Description BLOOD LEFT ANTECUBITAL  Final   Special Requests   Final    BOTTLES DRAWN AEROBIC AND ANAEROBIC  AER [place] ANA 4CC   Culture  Setup Time   Final    GRAM POSITIVE COCCI IN BOTH AEROBIC AND ANAEROBIC BOTTLES CRITICAL VALUE NOTED.  VALUE IS  CONSISTENT WITH PREVIOUSLY REPORTED AND CALLED VALUE.    Culture (A)  Final    STAPHYLOCOCCUS AUREUS SUSCEPTIBILITIES PERFORMED ON PREVIOUS CULTURE WITHIN THE LAST 5 DAYS. Performed at [facility], [place] [address], [place], [place] [address]    Report Status 11/21/2016 FINAL  Final  Culture, blood (Routine x 2)     Status: Abnormal   Collection Time: 11/18/16 11:28 PM  Result Value Ref Range Status   Specimen Description BLOOD LEFT HAND  Final   Special Requests   Final    BOTTLES DRAWN AEROBIC AND ANAEROBIC  AER 2CC ANA [place]   Culture  Setup Time   Final    GRAM POSITIVE COCCI IN BOTH AEROBIC AND ANAEROBIC BOTTLES CRITICAL RESULT CALLED TO, READ BACK BY AND VERIFIED WITH: SHEENA HALLAJI AT 7672 ON 11/19/16 [place]. Performed at [facility], [place] [address] [place], [place] [address]    Culture STAPHYLOCOCCUS AUREUS (A)  Final   Report Status 11/21/2016 FINAL  Final   Organism ID, Bacteria STAPHYLOCOCCUS AUREUS  Final      Susceptibility   Staphylococcus aureus - MIC*    CIPROFLOXACIN <=0.5 SENSITIVE Sensitive     ERYTHROMYCIN <=0.25 SENSITIVE Sensitive     GENTAMICIN <=0.5 SENSITIVE Sensitive     OXACILLIN 0.5 SENSITIVE Sensitive     TETRACYCLINE <=1 SENSITIVE Sensitive     VANCOMYCIN  1 SENSITIVE Sensitive     TRIMETH/SULFA <=10 SENSITIVE Sensitive     CLINDAMYCIN <=0.25 SENSITIVE Sensitive     RIFAMPIN <=0.5 SENSITIVE Sensitive     Inducible Clindamycin NEGATIVE Sensitive     * STAPHYLOCOCCUS AUREUS  Urine culture     Status: Abnormal   Collection Time: 11/18/16 11:28 PM  Result Value Ref Range Status   Specimen Description URINE, RANDOM  Final   Special Requests NONE  Final   Culture (A)  Final    <10,000 COLONIES/mL INSIGNIFICANT GROWTH Performed at [facility], [place] [address], [place], [place] [address]    Report Status 11/20/2016 FINAL  Final  Blood Culture ID Panel (Reflexed)     Status: Abnormal   Collection Time: 11/18/16 11:28 PM  Result Value  Ref Range Status   Enterococcus species NOT DETECTED NOT DETECTED Final   Listeria monocytogenes NOT DETECTED NOT DETECTED Final   Staphylococcus species DETECTED (A) NOT DETECTED Final    Comment: CRITICAL RESULT CALLED TO, READ BACK BY AND VERIFIED WITH: SHEENA HALLAJI AT [id number] ON 11/19/16 [place].    Staphylococcus aureus DETECTED (A) NOT DETECTED Final    Comment: Methicillin (oxacillin) susceptible Staphylococcus aureus (MSSA). Preferred therapy is anti staphylococcal beta lactam antibiotic (Cefazolin or Nafcillin), unless clinically contraindicated. CRITICAL RESULT CALLED TO, READ BACK BY AND VERIFIED WITH: SHEENA HALLAJI AT 0932 ON 11/19/16 [place].    Methicillin resistance NOT DETECTED NOT DETECTED Final   Streptococcus species NOT DETECTED NOT DETECTED Final   Streptococcus agalactiae NOT DETECTED NOT DETECTED Final   Streptococcus pneumoniae NOT DETECTED NOT DETECTED Final   Streptococcus pyogenes NOT DETECTED NOT DETECTED Final   Acinetobacter baumannii NOT DETECTED NOT DETECTED Final   Enterobacteriaceae species NOT DETECTED NOT DETECTED Final   Enterobacter cloacae complex NOT DETECTED NOT DETECTED Final   Escherichia coli NOT DETECTED NOT DETECTED Final   Klebsiella oxytoca NOT DETECTED NOT DETECTED Final   Klebsiella pneumoniae NOT DETECTED NOT DETECTED Final   Proteus species NOT DETECTED NOT DETECTED Final   Serratia marcescens NOT DETECTED NOT DETECTED Final   Haemophilus influenzae NOT DETECTED NOT DETECTED Final   Neisseria meningitidis NOT DETECTED NOT DETECTED Final   Pseudomonas aeruginosa NOT DETECTED NOT DETECTED Final   Candida albicans NOT DETECTED NOT DETECTED Final   Candida glabrata NOT DETECTED NOT DETECTED Final   Candida krusei NOT DETECTED NOT DETECTED Final   Candida parapsilosis NOT DETECTED NOT DETECTED Final   Candida tropicalis NOT DETECTED NOT DETECTED Final  MRSA PCR Screening     Status: None   Collection Time: 11/19/16  3:50 AM  Result Value Ref Range  Status   MRSA by PCR NEGATIVE NEGATIVE Final    Comment:        The GeneXpert MRSA Assay (FDA approved for NASAL specimens only), is one component of a comprehensive MRSA colonization surveillance program. It is not intended to diagnose MRSA infection nor to guide or monitor treatment for MRSA infections.   Culture, blood (Routine X 2) w Reflex to ID Panel     Status: None   Collection Time: 11/20/16  4:22 PM  Result Value Ref Range Status   Specimen Description BLOOD LEFT ASSIST CONTROL  Final   Special Requests   Final    BOTTLES DRAWN AEROBIC AND ANAEROBIC ANA11ML AER14ML   Culture NO GROWTH 5 DAYS  Final   Report Status 11/25/2016 FINAL  Final  Culture, blood (Routine X 2) w Reflex to ID Panel  Status: None   Collection Time: 11/20/16  4:28 PM  Result Value Ref Range Status   Specimen Description BLOOD RIGHT ASSIST CONTROL  Final   Special Requests   Final    BOTTLES DRAWN AEROBIC AND ANAEROBIC AER12ML ANA11ML   Culture NO GROWTH 5 DAYS  Final   Report Status 11/25/2016 FINAL  Final    IMAGING: Ct Angio Head W Or Wo Contrast  Addendum Date: 12/15/2016   ADDENDUM REPORT: 12/15/2016 15:16 ADDENDUM: Study discussed by telephone with Dr. Fritzi Mandes on 12/15/2016 at 1503 hours. Electronically Signed   By: Genevie Ann M.D.   On: 12/15/2016 15:16   Result Date: 12/15/2016 CLINICAL DATA:  71 year old male with left side weakness since 0400 hours today. Initial encounter. EXAM: CT ANGIOGRAPHY HEAD AND NECK TECHNIQUE: Multidetector CT imaging of the head and neck was performed using the standard protocol during bolus administration of intravenous contrast. Multiplanar CT image reconstructions and MIPs were obtained to evaluate the vascular anatomy. Carotid stenosis measurements (when applicable) are obtained utilizing NASCET criteria, using the distal internal carotid diameter as the denominator. CONTRAST:  75 mL Isovue 370 COMPARISON:  Head CT without contrast 1256 hours today. Brain MRI  with intracranial MRA 03/07/2008 FINDINGS: CTA NECK Skeleton: Extensive previous cervical spine ACDF. Previous median sternotomy. No acute osseous abnormality identified. Visualized paranasal sinuses and mastoids are stable and well pneumatized. Upper chest: Incidental azygos fissure. Mild mosaic attenuation in the visualized bilateral lung parenchyma. No superior mediastinal lymphadenopathy. Other neck: Thyroid, larynx, pharynx, parapharyngeal spaces, retropharyngeal space, sublingual space, submandibular glands and parotid glands are within normal limits. No cervical lymphadenopathy. Aortic arch: Bovine type arch configuration. Mild to moderate aortic arch calcified atherosclerosis. Right carotid system: No brachiocephalic or right CCA origin stenosis despite soft and calcified plaque. Mildly tortuous proximal right ICA. Widely patent right carotid bifurcation with minimal soft plaque. Mildly tortuous cervical right ICA. Left carotid system: Bovine type left CCA origin with no origin stenosis. Mild circumferential soft plaque in the left CCA. Widely patent left carotid bifurcation. Mild left ICA bulb soft and calcified plaque without stenosis. Mildly tortuous cervical left ICA. Vertebral arteries:Mild tortuosity of the proximal right subclavian artery with soft and calcified plaque but no stenosis. Calcified plaque at the right vertebral artery origin with mild to moderate origin stenosis (Series 7, image 167). Otherwise negative right vertebral artery to the skullbase. No significant proximal left subclavian artery stenosis despite soft and calcified plaque. Calcified plaque at the left vertebral artery origin with severe origin stenosis (series 8, image 137). The left vertebral artery is mildly non dominant and otherwise negative to the skullbase. CTA HEAD Posterior circulation: Dominant distal right vertebral artery with no stenosis to the vertebrobasilar junction. Normal right PICA origin. Calcified plaque in  the non dominant distal left V4 segment distal to the low PICA origin which remains normal. Subsequent moderate distal left vertebral stenosis, but the vessel remains patent to the vertebrobasilar junction. No basilar stenosis. Patent SCA and PCA origins. Both posterior communicating arteries are present but small. Right PCA branches are normal. There is mild irregularity without significant stenosis in the left P1 segment. Left PCA branches are within normal limits. Anterior circulation: Both ICA siphons are patent. Minimal siphon calcified plaque without stenosis. Normal bilateral ophthalmic and posterior communicating artery origins. Patent carotid termini. Normal MCA and ACA origins. Anterior communicating artery and bilateral ACA branches are normal with a prominent median artery of the corpus callosum (normal variant). Left MCA M1 segment, trifurcation, and  left MCA branches are normal. Right MCA M1 segment, trifurcation, and proximal M2 branches are normal. There is a severe stenosis at the bifurcation of a posterior right MCA M2 branch best seen on series 8, image 61 (also series 11, image 14 and series 6, image 100), but the downstream branches remain patent. Venous sinuses: Patent. Anatomic variants: Dominant right vertebral artery. Median artery of the corpus callosum. Bovine type aortic arch configuration. Delayed phase: Small age indeterminate cortically based infarct at the right sensory strip (series 12, image 24), might have been present in December 2017. Otherwise no cortically based infarct is evident in the right MCA territory. No acute intracranial hemorrhage identified. No intracranial mass effect. No abnormal enhancement identified. Review of the MIP images confirms the above findings IMPRESSION: 1. Negative for emergent large vessel occlusion but positive for filling defect at the bifurcation of a right MCA middle M2 branch. Despite this severe stenosis the downstream M3 branches remain  patent. 2. Otherwise mild for age intra- and extracranial carotid and anterior circulation atherosclerosis. 3. Dominant right vertebral artery with up to moderate stenosis at its origin. Severe stenosis of the non dominant left vertebral artery at both its origin and distal V4 segment. The left PICA remains patent. 4. Mild atherosclerosis without stenosis of the left PCA P1 segment. 5. Small age indeterminate cortical infarct in the posterior right MCA territory sensory strip. No other right MCA territory ischemic changes by CT. 6. Mild mosaic attenuation in the upper lungs may indicate gas trapping. 7.  Calcified aortic atherosclerosis. Electronically Signed: By: Genevie Ann M.D. On: 12/15/2016 14:59   Dg Chest 1 View  Result Date: 11/23/2016 CLINICAL DATA:  PICC line placement EXAM: CHEST 1 VIEW COMPARISON:  11/20/2016 CXR, 10/02/2016 CT FINDINGS: New right-sided PICC line tip is seen in the distal SVC. Unchanged left IJ central line catheter in the expected location of the distal left brachiocephalic vein. Heart is enlarged but stable. There is aortic atherosclerosis. Median sternotomy sutures are in place with aortic valvular replacement. Chronic stable mild interstitial edema. No pneumonic consolidation or pneumothorax ACDF of the cervical spine. IMPRESSION: Mild interstitial edema with stable cardiomegaly and aortic atherosclerosis. PICC line tip in the distal SVC. Left IJ line is unchanged in appearance with tip in the region of the distal left brachiocephalic vein. Electronically Signed   By: Ashley Royalty M.D.   On: 11/23/2016 20:40   Ct Head Wo Contrast  Result Date: 12/15/2016 CLINICAL DATA:  71 year old male with slurred speech and left arm weakness since 4 a.m. today. Two recent episodes of sepsis since December 2017. Diaphoresis. EXAM: CT HEAD WITHOUT CONTRAST TECHNIQUE: Contiguous axial images were obtained from the base of the skull through the vertex without intravenous contrast. COMPARISON:  Head  CT 10/02/2016. FINDINGS: Brain: Patchy and confluent areas of decreased attenuation are noted throughout the deep and periventricular white matter of the cerebral hemispheres bilaterally, compatible with chronic microvascular ischemic disease. No evidence of acute infarction, hemorrhage, hydrocephalus, extra-axial collection or mass lesion/mass effect. Vascular: No hyperdense vessel or unexpected calcification. Skull: Normal. Negative for fracture or focal lesion. Sinuses/Orbits: No acute finding. Other: None. IMPRESSION: 1. No acute intracranial abnormalities. 2. Chronic microvascular ischemic changes in the cerebral white matter. Electronically Signed   By: Vinnie Langton M.D.   On: 12/15/2016 13:04   Ct Angio Neck W Or Wo Contrast  Addendum Date: 12/15/2016   ADDENDUM REPORT: 12/15/2016 15:16 ADDENDUM: Study discussed by telephone with Dr. Fritzi Mandes on 12/15/2016 at  1503 hours. Electronically Signed   By: Genevie Ann M.D.   On: 12/15/2016 15:16   Result Date: 12/15/2016 CLINICAL DATA:  71 year old male with left side weakness since 0400 hours today. Initial encounter. EXAM: CT ANGIOGRAPHY HEAD AND NECK TECHNIQUE: Multidetector CT imaging of the head and neck was performed using the standard protocol during bolus administration of intravenous contrast. Multiplanar CT image reconstructions and MIPs were obtained to evaluate the vascular anatomy. Carotid stenosis measurements (when applicable) are obtained utilizing NASCET criteria, using the distal internal carotid diameter as the denominator. CONTRAST:  75 mL Isovue 370 COMPARISON:  Head CT without contrast 1256 hours today. Brain MRI with intracranial MRA 03/07/2008 FINDINGS: CTA NECK Skeleton: Extensive previous cervical spine ACDF. Previous median sternotomy. No acute osseous abnormality identified. Visualized paranasal sinuses and mastoids are stable and well pneumatized. Upper chest: Incidental azygos fissure. Mild mosaic attenuation in the visualized  bilateral lung parenchyma. No superior mediastinal lymphadenopathy. Other neck: Thyroid, larynx, pharynx, parapharyngeal spaces, retropharyngeal space, sublingual space, submandibular glands and parotid glands are within normal limits. No cervical lymphadenopathy. Aortic arch: Bovine type arch configuration. Mild to moderate aortic arch calcified atherosclerosis. Right carotid system: No brachiocephalic or right CCA origin stenosis despite soft and calcified plaque. Mildly tortuous proximal right ICA. Widely patent right carotid bifurcation with minimal soft plaque. Mildly tortuous cervical right ICA. Left carotid system: Bovine type left CCA origin with no origin stenosis. Mild circumferential soft plaque in the left CCA. Widely patent left carotid bifurcation. Mild left ICA bulb soft and calcified plaque without stenosis. Mildly tortuous cervical left ICA. Vertebral arteries:Mild tortuosity of the proximal right subclavian artery with soft and calcified plaque but no stenosis. Calcified plaque at the right vertebral artery origin with mild to moderate origin stenosis (Series 7, image 167). Otherwise negative right vertebral artery to the skullbase. No significant proximal left subclavian artery stenosis despite soft and calcified plaque. Calcified plaque at the left vertebral artery origin with severe origin stenosis (series 8, image 137). The left vertebral artery is mildly non dominant and otherwise negative to the skullbase. CTA HEAD Posterior circulation: Dominant distal right vertebral artery with no stenosis to the vertebrobasilar junction. Normal right PICA origin. Calcified plaque in the non dominant distal left V4 segment distal to the low PICA origin which remains normal. Subsequent moderate distal left vertebral stenosis, but the vessel remains patent to the vertebrobasilar junction. No basilar stenosis. Patent SCA and PCA origins. Both posterior communicating arteries are present but small. Right PCA  branches are normal. There is mild irregularity without significant stenosis in the left P1 segment. Left PCA branches are within normal limits. Anterior circulation: Both ICA siphons are patent. Minimal siphon calcified plaque without stenosis. Normal bilateral ophthalmic and posterior communicating artery origins. Patent carotid termini. Normal MCA and ACA origins. Anterior communicating artery and bilateral ACA branches are normal with a prominent median artery of the corpus callosum (normal variant). Left MCA M1 segment, trifurcation, and left MCA branches are normal. Right MCA M1 segment, trifurcation, and proximal M2 branches are normal. There is a severe stenosis at the bifurcation of a posterior right MCA M2 branch best seen on series 8, image 61 (also series 11, image 14 and series 6, image 100), but the downstream branches remain patent. Venous sinuses: Patent. Anatomic variants: Dominant right vertebral artery. Median artery of the corpus callosum. Bovine type aortic arch configuration. Delayed phase: Small age indeterminate cortically based infarct at the right sensory strip (series 12, image 24), might have  been present in December 2017. Otherwise no cortically based infarct is evident in the right MCA territory. No acute intracranial hemorrhage identified. No intracranial mass effect. No abnormal enhancement identified. Review of the MIP images confirms the above findings IMPRESSION: 1. Negative for emergent large vessel occlusion but positive for filling defect at the bifurcation of a right MCA middle M2 branch. Despite this severe stenosis the downstream M3 branches remain patent. 2. Otherwise mild for age intra- and extracranial carotid and anterior circulation atherosclerosis. 3. Dominant right vertebral artery with up to moderate stenosis at its origin. Severe stenosis of the non dominant left vertebral artery at both its origin and distal V4 segment. The left PICA remains patent. 4. Mild  atherosclerosis without stenosis of the left PCA P1 segment. 5. Small age indeterminate cortical infarct in the posterior right MCA territory sensory strip. No other right MCA territory ischemic changes by CT. 6. Mild mosaic attenuation in the upper lungs may indicate gas trapping. 7.  Calcified aortic atherosclerosis. Electronically Signed: By: Genevie Ann M.D. On: 12/15/2016 14:59   Mr Brain Wo Contrast  Result Date: 12/15/2016 CLINICAL DATA:  Infected endocarditis.  TIA. EXAM: MRI HEAD WITHOUT CONTRAST TECHNIQUE: Multiplanar, multiecho pulse sequences of the brain and surrounding structures were obtained without intravenous contrast. COMPARISON:  Head CT 12/15/2016 Brain MRI 03/07/2008 FINDINGS: Brain: There is linear restricted diffusion along the right external capsule. There is an additional punctate focus of abnormal DWI within the superior left parietal lobe. There is multifocal hyperintense T2-weighted signal within the periventricular white matter, most often seen in the setting of chronic microvascular ischemia. No mass lesion or midline shift. No hydrocephalus or extra-axial fluid collection. The midline structures are normal. No age advanced or lobar predominant atrophy. Vascular: Major intracranial arterial and venous sinus flow voids are preserved. No evidence of chronic microhemorrhage or amyloid angiopathy. Skull and upper cervical spine: The visualized skull base, calvarium, upper cervical spine and extracranial soft tissues are normal. Sinuses/Orbits: No fluid levels or advanced mucosal thickening. No mastoid effusion. Normal orbits. IMPRESSION: 1. Punctate focus of acute ischemia within the superior left parietal lobe and linear area of acute to early subacute ischemia along the right external capsule. The distribution of lesions is suggestive of a central cardio embolic process. No hemorrhage or mass effect. 2. Chronic microvascular ischemia. Electronically Signed   By: Ulyses Jarred M.D.   On:  12/15/2016 18:20   Dg Chest Port 1 View  Result Date: 11/20/2016 CLINICAL DATA:  Respiratory failure . EXAM: PORTABLE CHEST 1 VIEW COMPARISON:  11/19/2016.  10/04/2016. FINDINGS: Left IJ line in stable position. Prior CABG. Prior cardiac valve replacement. Cardiomegaly with pulmonary vascular prominence and interstitial prominence noted consistent with CHF. No pleural effusion or pneumothorax. Prior cervical spine fusion stable in appearance from multiple prior exams. IMPRESSION: 1.  Left IJ line stable position. 2. Prior CABG. Prior cardiac valve replacement. Congestive heart failure mild pulmonary interstitial edema. Electronically Signed   By: Marcello Moores  Register   On: 11/20/2016 07:09   Dg Chest Port 1 View  Result Date: 11/19/2016 CLINICAL DATA:  Central line placement. EXAM: PORTABLE CHEST 1 VIEW COMPARISON:  Chest radiograph January 16, 2017 FINDINGS: LEFT internal jugular central venous catheter distal tip projects at proximal superior vena cava. The cardiac silhouette is similarly enlarged. Pulmonary vascular congestion without pleural effusion or focal consolidation. No pneumothorax. Soft tissue planes and included osseous structure nonsuspicious. Status post median sternotomy for cardiac valve replacement. ACDF. IMPRESSION: LEFT IJ central venous  catheter distal tip projects in proximal superior vena cava. No pneumothorax. Stable cardiomegaly and pulmonary vascular congestion. Electronically Signed   By: Elon Alas M.D.   On: 11/19/2016 05:26   Dg Chest Portable 1 View  Result Date: 11/19/2016 CLINICAL DATA:  Sepsis and fever.  Confusion. EXAM: PORTABLE CHEST 1 VIEW COMPARISON:  10/04/2016 FINDINGS: A single AP portable view of the chest demonstrates no focal airspace consolidation or alveolar edema. The lungs are grossly clear. There is no large effusion or pneumothorax. There is unchanged cardiomegaly. Cardiac and mediastinal contours are otherwise unremarkable. IMPRESSION: Mild cardiomegaly,  unchanged.  No consolidation or effusion. Electronically Signed   By: Andreas Newport M.D.   On: 11/19/2016 00:19    Assessment:   [name] is a 71 y.o. 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 He is currenlty on coumadin for A fib due to interaction between the rifampin and the xarelto he was on previously. His INR has been subtherapeutic. He has been seen by neurology   He has no fever or elevated wbc. He has been otherwise tolerating his abx and has finished the gent portion of treatment.  I discussed case with Dr Nehemiah Massed who feels likely A fib associated CVA due to sub therapeutic INR and not due to septic emboli.   Recommendations Continue nafcillin and rifampin He cannot be treated with a NOAC due to interaction with rifampin He cannot stop the rifampin due to his Prosthetic valve endocarditis I have ordered repeat TTE  Thank you very much for allowing me to participate in the care of this patient. Please call with questions.   Cheral Marker. Ola Spurr, MD

## 2016-12-16 NOTE — Progress Notes (Addendum)
ANTICOAGULATION CONSULT NOTE - Initial Consult  Pharmacy Consult for heparin and warfarin dosing Indication: atrial fibrillation and cardioembolic stroke  Allergies  Allergen Reactions  . Rofecoxib Other (See Comments)    ulcers  . Tetracyclines & Related     Patient Measurements: Height: '5\' 10"'$  (177.8 cm) Weight: 268 lb (121.6 kg) IBW/kg (Calculated) : 73 Heparin Dosing Weight: 100.3 kg  Vital Signs: Temp: 97.9 F (36.6 C) (02/28 1211) Temp Source: Oral (02/28 1211) BP: 125/73 (02/28 1211) Pulse Rate: 90 (02/28 1211)  Labs:  Recent Labs  12/15/16 1233 12/15/16 1300 12/15/16 1917 12/16/16 0227 12/16/16 1155  HGB 12.4*  --   --   --   --   HCT 35.5*  --   --   --   --   PLT 203  --   --   --   --   APTT  --   --  29  --   --   LABPROT  --  15.0  --  15.3*  --   INR  --  1.17  --  1.20  --   HEPARINUNFRC  --   --   --  0.13* 0.34  CREATININE 1.19  --   --   --   --     Estimated Creatinine Clearance: 75.5 mL/min (by C-G formula based on SCr of 1.19 mg/dL).   Medical History: Past Medical History:  Diagnosis Date  . Chronic pain   . Coronary artery disease   . Diabetes mellitus without complication ([place])   . DJD (degenerative joint disease)   . GERD (gastroesophageal reflux disease)   . Hyperlipemia   . Hypertension   . Insomnia   . Neuropathy ([facility])     Medications:  Scheduled:  . amiodarone  200 mg Oral Daily  . aspirin EC  81 mg Oral Daily  . DULoxetine  60 mg Oral QHS  . feeding supplement (ENSURE ENLIVE)  237 mL Oral BID BM  . furosemide  40 mg Oral Daily  . gabapentin  1,200 mg Oral TID  . glimepiride  2 mg Oral Daily  . lisinopril  10 mg Oral Daily  . loratadine  10 mg Oral Daily  . metFORMIN  1,000 mg Oral BID WC  . methadone  5 mg Oral BID  . metoprolol tartrate  25 mg Oral BID  . multivitamin with minerals  1 tablet Oral Daily  . Oxcarbazepine  300 mg Oral BID  . pantoprazole  40 mg Oral Daily  . pravastatin  40 mg Oral QHS  .  rifampin  300 mg Oral Q8H  . vitamin B-12  1,000 mcg Oral Daily  . warfarin  10 mg Oral q1800  . 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 Patient was admitted for left sided weakness. 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 Patient is being treated w/ heparin bridge until 2 consecutive therapeutic INR values.  Goal of Therapy:  Heparin level 0.3-0.7 units/ml and INR 2.5 - 3.5 x mechanical mitral valve Monitor platelets by anticoagulation protocol: Yes   Plan:  2/27 Heparin drip started @ 1400 units/hour --> HL drawn 8 hours after found to be 0.13 then drip was increased to 1700 units/hour 2/28 HL @ 1155 0.34 which is in goal range--will continue  current rate and recheck HL @ 2000 tonight.  Warfarin: 2/27 INR 1.17 warfarin 10 mg 2/28 INR 1.20 will continue warfarin 10 mg tonight and recheck daily INRs  Will continue heparin bridge until two consecutive therpeutic INRs are achieved.  Thank you for this consult.  Tobie Lords, PharmD, BCPS Clinical Pharmacist 12/16/2016

## 2016-12-16 NOTE — Care Management (Signed)
Spoke with Mr. [name] at the bedside. States he was told that his prescription  for Xarelto would %150.00 per his pharmacist.  Telephone call to [name], [name] representative. [name] has [employer] for his insurance. He should only have to pay $40 for 30 days or $80 for 90 days for this Tier 3 drug. Gave [name] the telephone number for the [name] representative.  Dr. Heron Sabins updated. Shelbie Ammons RN MSN CCM Care Management

## 2016-12-16 NOTE — Progress Notes (Addendum)
Subjective: Patient OOB in chair.  Improved but not back to baseline.    Objective: Current vital signs: BP 128/76 (BP Location: Left Arm)   Pulse 87   Temp 98.5 F (36.9 C) (Oral)   Resp 20   Ht '5\' 10"'$  (1.778 m)   Wt 121.6 kg (268 lb)   SpO2 97%   BMI 38.45 kg/m  Vital signs in last 24 hours: Temp:  [98 F (36.7 C)-98.5 F (36.9 C)] 98.5 F (36.9 C) (02/28 0907) Pulse Rate:  [59-99] 87 (02/28 0907) Resp:  [15-23] 20 (02/28 0156) BP: (104-159)/(58-97) 128/76 (02/28 0907) SpO2:  [97 %-100 %] 97 % (02/28 0907) Weight:  [121.6 kg (268 lb)] 121.6 kg (268 lb) (02/27 1222)  Intake/Output from previous day: 02/27 0701 - 02/28 0700 In: 244 [I.V.:244] Out: 1050 [Urine:1050] Intake/Output this shift: Total I/O In: 240 [P.O.:240] Out: -  Nutritional status: Diet Carb Modified Fluid consistency: Thin; Room service appropriate? Yes  Neurologic Exam: Mental Status: Alert, oriented, thought content appropriate.  Speech fluent without evidence of aphasia.  Able to follow 3 step commands without difficulty. Cranial Nerves: II: Discs flat bilaterally; Visual fields grossly normal, pupils equal, round, reactive to light and accommodation III,IV, VI: ptosis not present, extra-ocular motions intact bilaterally V,VII: smile symmetric, facial light touch sensation normal bilaterally VIII: hearing normal bilaterally IX,X: gag reflex present XI: bilateral shoulder shrug XII: midline tongue extension Motor: Right : Upper extremity   5/5    Left:     Upper extremity   4+/5  Lower extremity   5/5     Lower extremity   5-/5 Tone and bulk:normal tone throughout; no atrophy noted Sensory: Pinprick and light touch intact throughout, bilaterally   Lab Results: Basic Metabolic Panel:  Recent Labs Lab 12/15/16 1233  NA 138  K 4.0  CL 106  CO2 24  GLUCOSE 61*  BUN 19  CREATININE 1.19  CALCIUM 9.2    Liver Function Tests: No results for input(s): AST, ALT, ALKPHOS, BILITOT, PROT,  ALBUMIN in the last 168 hours. No results for input(s): LIPASE, AMYLASE in the last 168 hours. No results for input(s): AMMONIA in the last 168 hours.  CBC:  Recent Labs Lab 12/15/16 1233  WBC 10.6  HGB 12.4*  HCT 35.5*  MCV 83.7  PLT 203    Cardiac Enzymes: No results for input(s): CKTOTAL, CKMB, CKMBINDEX, TROPONINI in the last 168 hours.  Lipid Panel: No results for input(s): CHOL, TRIG, HDL, CHOLHDL, VLDL, LDLCALC in the last 168 hours.  CBG:  Recent Labs Lab 12/15/16 1411  GLUCAP 38    Microbiology: Results for orders placed or performed during the hospital encounter of 11/18/16  Culture, blood (Routine x 2)     Status: Abnormal   Collection Time: 11/18/16 11:28 PM  Result Value Ref Range Status   Specimen Description BLOOD LEFT ANTECUBITAL  Final   Special Requests   Final    BOTTLES DRAWN AEROBIC AND ANAEROBIC  AER [place] ANA 4CC   Culture  Setup Time   Final    GRAM POSITIVE COCCI IN BOTH AEROBIC AND ANAEROBIC BOTTLES CRITICAL VALUE NOTED.  VALUE IS CONSISTENT WITH PREVIOUSLY REPORTED AND CALLED VALUE.    Culture (A)  Final    STAPHYLOCOCCUS AUREUS SUSCEPTIBILITIES PERFORMED ON PREVIOUS CULTURE WITHIN THE LAST 5 DAYS. Performed at [facility], [place] [address] [place], [place] [address]    Report Status 11/21/2016 FINAL  Final  Culture, blood (Routine x 2)  Status: Abnormal   Collection Time: 11/18/16 11:28 PM  Result Value Ref Range Status   Specimen Description BLOOD LEFT HAND  Final   Special Requests   Final    BOTTLES DRAWN AEROBIC AND ANAEROBIC  AER 2CC ANA [place]   Culture  Setup Time   Final    GRAM POSITIVE COCCI IN BOTH AEROBIC AND ANAEROBIC BOTTLES CRITICAL RESULT CALLED TO, READ BACK BY AND VERIFIED WITH: SHEENA HALLAJI AT 4401 ON 11/19/16 [place]. Performed at [facility], [place] [address], [place], [place] [address]    Culture STAPHYLOCOCCUS AUREUS (A)  Final   Report Status 11/21/2016 FINAL  Final   Organism ID, Bacteria  STAPHYLOCOCCUS AUREUS  Final      Susceptibility   Staphylococcus aureus - MIC*    CIPROFLOXACIN <=0.5 SENSITIVE Sensitive     ERYTHROMYCIN <=0.25 SENSITIVE Sensitive     GENTAMICIN <=0.5 SENSITIVE Sensitive     OXACILLIN 0.5 SENSITIVE Sensitive     TETRACYCLINE <=1 SENSITIVE Sensitive     VANCOMYCIN 1 SENSITIVE Sensitive     TRIMETH/SULFA <=10 SENSITIVE Sensitive     CLINDAMYCIN <=0.25 SENSITIVE Sensitive     RIFAMPIN <=0.5 SENSITIVE Sensitive     Inducible Clindamycin NEGATIVE Sensitive     * STAPHYLOCOCCUS AUREUS  Urine culture     Status: Abnormal   Collection Time: 11/18/16 11:28 PM  Result Value Ref Range Status   Specimen Description URINE, RANDOM  Final   Special Requests NONE  Final   Culture (A)  Final    <10,000 COLONIES/mL INSIGNIFICANT GROWTH Performed at [facility], [place] [address] [place], [place] [address]    Report Status 11/20/2016 FINAL  Final  Blood Culture ID Panel (Reflexed)     Status: Abnormal   Collection Time: 11/18/16 11:28 PM  Result Value Ref Range Status   Enterococcus species NOT DETECTED NOT DETECTED Final   Listeria monocytogenes NOT DETECTED NOT DETECTED Final   Staphylococcus species DETECTED (A) NOT DETECTED Final    Comment: CRITICAL RESULT CALLED TO, READ BACK BY AND VERIFIED WITH: SHEENA HALLAJI AT 0347 ON 11/19/16 [place].    Staphylococcus aureus DETECTED (A) NOT DETECTED Final    Comment: Methicillin (oxacillin) susceptible Staphylococcus aureus (MSSA). Preferred therapy is anti staphylococcal beta lactam antibiotic (Cefazolin or Nafcillin), unless clinically contraindicated. CRITICAL RESULT CALLED TO, READ BACK BY AND VERIFIED WITH: SHEENA HALLAJI AT 4259 ON 11/19/16 [place].    Methicillin resistance NOT DETECTED NOT DETECTED Final   Streptococcus species NOT DETECTED NOT DETECTED Final   Streptococcus agalactiae NOT DETECTED NOT DETECTED Final   Streptococcus pneumoniae NOT DETECTED NOT DETECTED Final   Streptococcus pyogenes NOT  DETECTED NOT DETECTED Final   Acinetobacter baumannii NOT DETECTED NOT DETECTED Final   Enterobacteriaceae species NOT DETECTED NOT DETECTED Final   Enterobacter cloacae complex NOT DETECTED NOT DETECTED Final   Escherichia coli NOT DETECTED NOT DETECTED Final   Klebsiella oxytoca NOT DETECTED NOT DETECTED Final   Klebsiella pneumoniae NOT DETECTED NOT DETECTED Final   Proteus species NOT DETECTED NOT DETECTED Final   Serratia marcescens NOT DETECTED NOT DETECTED Final   Haemophilus influenzae NOT DETECTED NOT DETECTED Final   Neisseria meningitidis NOT DETECTED NOT DETECTED Final   Pseudomonas aeruginosa NOT DETECTED NOT DETECTED Final   Candida albicans NOT DETECTED NOT DETECTED Final   Candida glabrata NOT DETECTED NOT DETECTED Final   Candida krusei NOT DETECTED NOT DETECTED Final   Candida parapsilosis NOT DETECTED NOT DETECTED Final  Candida tropicalis NOT DETECTED NOT DETECTED Final  MRSA PCR Screening     Status: None   Collection Time: 11/19/16  3:50 AM  Result Value Ref Range Status   MRSA by PCR NEGATIVE NEGATIVE Final    Comment:        The GeneXpert MRSA Assay (FDA approved for NASAL specimens only), is one component of a comprehensive MRSA colonization surveillance program. It is not intended to diagnose MRSA infection nor to guide or monitor treatment for MRSA infections.   Culture, blood (Routine X 2) w Reflex to ID Panel     Status: None   Collection Time: 11/20/16  4:22 PM  Result Value Ref Range Status   Specimen Description BLOOD LEFT ASSIST CONTROL  Final   Special Requests   Final    BOTTLES DRAWN AEROBIC AND ANAEROBIC ANA11ML AER14ML   Culture NO GROWTH 5 DAYS  Final   Report Status 11/25/2016 FINAL  Final  Culture, blood (Routine X 2) w Reflex to ID Panel     Status: None   Collection Time: 11/20/16  4:28 PM  Result Value Ref Range Status   Specimen Description BLOOD RIGHT ASSIST CONTROL  Final   Special Requests   Final    BOTTLES DRAWN AEROBIC  AND ANAEROBIC AER12ML ANA11ML   Culture NO GROWTH 5 DAYS  Final   Report Status 11/25/2016 FINAL  Final    Coagulation Studies:  Recent Labs  12/15/16 1300 12/16/16 0227  LABPROT 15.0 15.3*  INR 1.17 1.20    Imaging: Ct Angio Head W Or Wo Contrast  Addendum Date: 12/15/2016   ADDENDUM REPORT: 12/15/2016 15:16 ADDENDUM: Study discussed by telephone with Dr. Fritzi Mandes on 12/15/2016 at 1503 hours. Electronically Signed   By: Genevie Ann M.D.   On: 12/15/2016 15:16   Result Date: 12/15/2016 CLINICAL DATA:  71 year old male with left side weakness since 0400 hours today. Initial encounter. EXAM: CT ANGIOGRAPHY HEAD AND NECK TECHNIQUE: Multidetector CT imaging of the head and neck was performed using the standard protocol during bolus administration of intravenous contrast. Multiplanar CT image reconstructions and MIPs were obtained to evaluate the vascular anatomy. Carotid stenosis measurements (when applicable) are obtained utilizing NASCET criteria, using the distal internal carotid diameter as the denominator. CONTRAST:  75 mL Isovue 370 COMPARISON:  Head CT without contrast 1256 hours today. Brain MRI with intracranial MRA 03/07/2008 FINDINGS: CTA NECK Skeleton: Extensive previous cervical spine ACDF. Previous median sternotomy. No acute osseous abnormality identified. Visualized paranasal sinuses and mastoids are stable and well pneumatized. Upper chest: Incidental azygos fissure. Mild mosaic attenuation in the visualized bilateral lung parenchyma. No superior mediastinal lymphadenopathy. Other neck: Thyroid, larynx, pharynx, parapharyngeal spaces, retropharyngeal space, sublingual space, submandibular glands and parotid glands are within normal limits. No cervical lymphadenopathy. Aortic arch: Bovine type arch configuration. Mild to moderate aortic arch calcified atherosclerosis. Right carotid system: No brachiocephalic or right CCA origin stenosis despite soft and calcified plaque. Mildly tortuous  proximal right ICA. Widely patent right carotid bifurcation with minimal soft plaque. Mildly tortuous cervical right ICA. Left carotid system: Bovine type left CCA origin with no origin stenosis. Mild circumferential soft plaque in the left CCA. Widely patent left carotid bifurcation. Mild left ICA bulb soft and calcified plaque without stenosis. Mildly tortuous cervical left ICA. Vertebral arteries:Mild tortuosity of the proximal right subclavian artery with soft and calcified plaque but no stenosis. Calcified plaque at the right vertebral artery origin with mild to moderate origin stenosis (Series 7, image 167).  Otherwise negative right vertebral artery to the skullbase. No significant proximal left subclavian artery stenosis despite soft and calcified plaque. Calcified plaque at the left vertebral artery origin with severe origin stenosis (series 8, image 137). The left vertebral artery is mildly non dominant and otherwise negative to the skullbase. CTA HEAD Posterior circulation: Dominant distal right vertebral artery with no stenosis to the vertebrobasilar junction. Normal right PICA origin. Calcified plaque in the non dominant distal left V4 segment distal to the low PICA origin which remains normal. Subsequent moderate distal left vertebral stenosis, but the vessel remains patent to the vertebrobasilar junction. No basilar stenosis. Patent SCA and PCA origins. Both posterior communicating arteries are present but small. Right PCA branches are normal. There is mild irregularity without significant stenosis in the left P1 segment. Left PCA branches are within normal limits. Anterior circulation: Both ICA siphons are patent. Minimal siphon calcified plaque without stenosis. Normal bilateral ophthalmic and posterior communicating artery origins. Patent carotid termini. Normal MCA and ACA origins. Anterior communicating artery and bilateral ACA branches are normal with a prominent median artery of the corpus  callosum (normal variant). Left MCA M1 segment, trifurcation, and left MCA branches are normal. Right MCA M1 segment, trifurcation, and proximal M2 branches are normal. There is a severe stenosis at the bifurcation of a posterior right MCA M2 branch best seen on series 8, image 61 (also series 11, image 14 and series 6, image 100), but the downstream branches remain patent. Venous sinuses: Patent. Anatomic variants: Dominant right vertebral artery. Median artery of the corpus callosum. Bovine type aortic arch configuration. Delayed phase: Small age indeterminate cortically based infarct at the right sensory strip (series 12, image 24), might have been present in December 2017. Otherwise no cortically based infarct is evident in the right MCA territory. No acute intracranial hemorrhage identified. No intracranial mass effect. No abnormal enhancement identified. Review of the MIP images confirms the above findings IMPRESSION: 1. Negative for emergent large vessel occlusion but positive for filling defect at the bifurcation of a right MCA middle M2 branch. Despite this severe stenosis the downstream M3 branches remain patent. 2. Otherwise mild for age intra- and extracranial carotid and anterior circulation atherosclerosis. 3. Dominant right vertebral artery with up to moderate stenosis at its origin. Severe stenosis of the non dominant left vertebral artery at both its origin and distal V4 segment. The left PICA remains patent. 4. Mild atherosclerosis without stenosis of the left PCA P1 segment. 5. Small age indeterminate cortical infarct in the posterior right MCA territory sensory strip. No other right MCA territory ischemic changes by CT. 6. Mild mosaic attenuation in the upper lungs may indicate gas trapping. 7.  Calcified aortic atherosclerosis. Electronically Signed: By: Genevie Ann M.D. On: 12/15/2016 14:59   Ct Head Wo Contrast  Result Date: 12/15/2016 CLINICAL DATA:  71 year old male with slurred speech and  left arm weakness since 4 a.m. today. Two recent episodes of sepsis since December 2017. Diaphoresis. EXAM: CT HEAD WITHOUT CONTRAST TECHNIQUE: Contiguous axial images were obtained from the base of the skull through the vertex without intravenous contrast. COMPARISON:  Head CT 10/02/2016. FINDINGS: Brain: Patchy and confluent areas of decreased attenuation are noted throughout the deep and periventricular white matter of the cerebral hemispheres bilaterally, compatible with chronic microvascular ischemic disease. No evidence of acute infarction, hemorrhage, hydrocephalus, extra-axial collection or mass lesion/mass effect. Vascular: No hyperdense vessel or unexpected calcification. Skull: Normal. Negative for fracture or focal lesion. Sinuses/Orbits: No acute finding. Other:  None. IMPRESSION: 1. No acute intracranial abnormalities. 2. Chronic microvascular ischemic changes in the cerebral white matter. Electronically Signed   By: Vinnie Langton M.D.   On: 12/15/2016 13:04   Ct Angio Neck W Or Wo Contrast  Addendum Date: 12/15/2016   ADDENDUM REPORT: 12/15/2016 15:16 ADDENDUM: Study discussed by telephone with Dr. Fritzi Mandes on 12/15/2016 at 1503 hours. Electronically Signed   By: Genevie Ann M.D.   On: 12/15/2016 15:16   Result Date: 12/15/2016 CLINICAL DATA:  71 year old male with left side weakness since 0400 hours today. Initial encounter. EXAM: CT ANGIOGRAPHY HEAD AND NECK TECHNIQUE: Multidetector CT imaging of the head and neck was performed using the standard protocol during bolus administration of intravenous contrast. Multiplanar CT image reconstructions and MIPs were obtained to evaluate the vascular anatomy. Carotid stenosis measurements (when applicable) are obtained utilizing NASCET criteria, using the distal internal carotid diameter as the denominator. CONTRAST:  75 mL Isovue 370 COMPARISON:  Head CT without contrast 1256 hours today. Brain MRI with intracranial MRA 03/07/2008 FINDINGS: CTA NECK  Skeleton: Extensive previous cervical spine ACDF. Previous median sternotomy. No acute osseous abnormality identified. Visualized paranasal sinuses and mastoids are stable and well pneumatized. Upper chest: Incidental azygos fissure. Mild mosaic attenuation in the visualized bilateral lung parenchyma. No superior mediastinal lymphadenopathy. Other neck: Thyroid, larynx, pharynx, parapharyngeal spaces, retropharyngeal space, sublingual space, submandibular glands and parotid glands are within normal limits. No cervical lymphadenopathy. Aortic arch: Bovine type arch configuration. Mild to moderate aortic arch calcified atherosclerosis. Right carotid system: No brachiocephalic or right CCA origin stenosis despite soft and calcified plaque. Mildly tortuous proximal right ICA. Widely patent right carotid bifurcation with minimal soft plaque. Mildly tortuous cervical right ICA. Left carotid system: Bovine type left CCA origin with no origin stenosis. Mild circumferential soft plaque in the left CCA. Widely patent left carotid bifurcation. Mild left ICA bulb soft and calcified plaque without stenosis. Mildly tortuous cervical left ICA. Vertebral arteries:Mild tortuosity of the proximal right subclavian artery with soft and calcified plaque but no stenosis. Calcified plaque at the right vertebral artery origin with mild to moderate origin stenosis (Series 7, image 167). Otherwise negative right vertebral artery to the skullbase. No significant proximal left subclavian artery stenosis despite soft and calcified plaque. Calcified plaque at the left vertebral artery origin with severe origin stenosis (series 8, image 137). The left vertebral artery is mildly non dominant and otherwise negative to the skullbase. CTA HEAD Posterior circulation: Dominant distal right vertebral artery with no stenosis to the vertebrobasilar junction. Normal right PICA origin. Calcified plaque in the non dominant distal left V4 segment distal to  the low PICA origin which remains normal. Subsequent moderate distal left vertebral stenosis, but the vessel remains patent to the vertebrobasilar junction. No basilar stenosis. Patent SCA and PCA origins. Both posterior communicating arteries are present but small. Right PCA branches are normal. There is mild irregularity without significant stenosis in the left P1 segment. Left PCA branches are within normal limits. Anterior circulation: Both ICA siphons are patent. Minimal siphon calcified plaque without stenosis. Normal bilateral ophthalmic and posterior communicating artery origins. Patent carotid termini. Normal MCA and ACA origins. Anterior communicating artery and bilateral ACA branches are normal with a prominent median artery of the corpus callosum (normal variant). Left MCA M1 segment, trifurcation, and left MCA branches are normal. Right MCA M1 segment, trifurcation, and proximal M2 branches are normal. There is a severe stenosis at the bifurcation of a posterior right MCA M2 branch  best seen on series 8, image 61 (also series 11, image 14 and series 6, image 100), but the downstream branches remain patent. Venous sinuses: Patent. Anatomic variants: Dominant right vertebral artery. Median artery of the corpus callosum. Bovine type aortic arch configuration. Delayed phase: Small age indeterminate cortically based infarct at the right sensory strip (series 12, image 24), might have been present in December 2017. Otherwise no cortically based infarct is evident in the right MCA territory. No acute intracranial hemorrhage identified. No intracranial mass effect. No abnormal enhancement identified. Review of the MIP images confirms the above findings IMPRESSION: 1. Negative for emergent large vessel occlusion but positive for filling defect at the bifurcation of a right MCA middle M2 branch. Despite this severe stenosis the downstream M3 branches remain patent. 2. Otherwise mild for age intra- and  extracranial carotid and anterior circulation atherosclerosis. 3. Dominant right vertebral artery with up to moderate stenosis at its origin. Severe stenosis of the non dominant left vertebral artery at both its origin and distal V4 segment. The left PICA remains patent. 4. Mild atherosclerosis without stenosis of the left PCA P1 segment. 5. Small age indeterminate cortical infarct in the posterior right MCA territory sensory strip. No other right MCA territory ischemic changes by CT. 6. Mild mosaic attenuation in the upper lungs may indicate gas trapping. 7.  Calcified aortic atherosclerosis. Electronically Signed: By: Genevie Ann M.D. On: 12/15/2016 14:59   Mr Brain Wo Contrast  Result Date: 12/15/2016 CLINICAL DATA:  Infected endocarditis.  TIA. EXAM: MRI HEAD WITHOUT CONTRAST TECHNIQUE: Multiplanar, multiecho pulse sequences of the brain and surrounding structures were obtained without intravenous contrast. COMPARISON:  Head CT 12/15/2016 Brain MRI 03/07/2008 FINDINGS: Brain: There is linear restricted diffusion along the right external capsule. There is an additional punctate focus of abnormal DWI within the superior left parietal lobe. There is multifocal hyperintense T2-weighted signal within the periventricular white matter, most often seen in the setting of chronic microvascular ischemia. No mass lesion or midline shift. No hydrocephalus or extra-axial fluid collection. The midline structures are normal. No age advanced or lobar predominant atrophy. Vascular: Major intracranial arterial and venous sinus flow voids are preserved. No evidence of chronic microhemorrhage or amyloid angiopathy. Skull and upper cervical spine: The visualized skull base, calvarium, upper cervical spine and extracranial soft tissues are normal. Sinuses/Orbits: No fluid levels or advanced mucosal thickening. No mastoid effusion. Normal orbits. IMPRESSION: 1. Punctate focus of acute ischemia within the superior left parietal lobe and  linear area of acute to early subacute ischemia along the right external capsule. The distribution of lesions is suggestive of a central cardio embolic process. No hemorrhage or mass effect. 2. Chronic microvascular ischemia. Electronically Signed   By: Ulyses Jarred M.D.   On: 12/15/2016 18:20    Medications:  I have reviewed the patient's current medications. Scheduled: . amiodarone  200 mg Oral Daily  . aspirin EC  81 mg Oral Daily  . DULoxetine  60 mg Oral QHS  . feeding supplement (ENSURE ENLIVE)  237 mL Oral BID BM  . furosemide  40 mg Oral Daily  . gabapentin  1,200 mg Oral TID  . glimepiride  2 mg Oral Daily  . lisinopril  10 mg Oral Daily  . loratadine  10 mg Oral Daily  . metFORMIN  1,000 mg Oral BID WC  . methadone  5 mg Oral BID  . metoprolol tartrate  25 mg Oral BID  . multivitamin with minerals  1 tablet Oral  Daily  . Oxcarbazepine  300 mg Oral BID  . pantoprazole  40 mg Oral Daily  . pravastatin  40 mg Oral QHS  . rifampin  300 mg Oral Q8H  . vitamin B-12  1,000 mcg Oral Daily  . warfarin  10 mg Oral q1800  . Warfarin - Physician Dosing Inpatient   Does not apply q1800    Assessment/Plan: Patient improved.  CTA reviewed and showed no evidence of LVO requiring intervention.   MRI of the brain reviewed and shows small areas of acute infarction in the superior left parietal lobe and right external capsule.  Likely embolic in origin.  With small areas of infarction and risk of further infarcts, patient was started on heparin with plans to bridge until Coumadin therapeutic.  INR today 1.2.   Recommendations: 1.  Agree with current efforts to get INR therapeutic      LOS: 1 day   Alexis Goodell, MD Neurology [phone] 12/16/2016  11:49 AM

## 2016-12-16 NOTE — Progress Notes (Signed)
ANTICOAGULATION CONSULT NOTE - Initial Consult  Pharmacy Consult for heparin and warfarin dosing Indication: atrial fibrillation and cardioembolic stroke  Allergies  Allergen Reactions  . Rofecoxib Other (See Comments)    ulcers  . Tetracyclines & Related     Patient Measurements: Height: '5\' 10"'$  (177.8 cm) Weight: 268 lb (121.6 kg) IBW/kg (Calculated) : 73 Heparin Dosing Weight: 100.3 kg  Vital Signs: Temp: 98.1 F (36.7 C) (02/28 2042) Temp Source: Oral (02/28 2042) BP: 107/63 (02/28 2042) Pulse Rate: 63 (02/28 2042)  Labs:  Recent Labs  12/15/16 1233 12/15/16 1300 12/15/16 1917 12/16/16 0227 12/16/16 1155 12/16/16 2004  HGB 12.4*  --   --   --   --   --   HCT 35.5*  --   --   --   --   --   PLT 203  --   --   --   --   --   APTT  --   --  29  --   --   --   LABPROT  --  15.0  --  15.3*  --   --   INR  --  1.17  --  1.20  --   --   HEPARINUNFRC  --   --   --  0.13* 0.34 0.26*  CREATININE 1.19  --   --   --   --   --     Estimated Creatinine Clearance: 75.5 mL/min (by C-G formula based on SCr of 1.19 mg/dL).   Medical History: Past Medical History:  Diagnosis Date  . Chronic pain   . Coronary artery disease   . Diabetes mellitus without complication ([place])   . DJD (degenerative joint disease)   . GERD (gastroesophageal reflux disease)   . Hyperlipemia   . Hypertension   . Insomnia   . Neuropathy (HCC)     Medications:  Scheduled:  . amiodarone  200 mg Oral Daily  . aspirin EC  81 mg Oral Daily  . DULoxetine  60 mg Oral QHS  . feeding supplement (ENSURE ENLIVE)  237 mL Oral BID BM  . furosemide  40 mg Oral Daily  . gabapentin  1,200 mg Oral TID  . heparin  1,500 Units Intravenous Once  . insulin aspart  0-5 Units Subcutaneous QHS  . insulin aspart  0-9 Units Subcutaneous TID WC  . lisinopril  10 mg Oral Daily  . loratadine  10 mg Oral Daily  . methadone  5 mg Oral BID  . metoprolol tartrate  25 mg Oral BID  . multivitamin with minerals  1  tablet Oral Daily  . Oxcarbazepine  300 mg Oral BID  . pantoprazole  40 mg Oral Daily  . pravastatin  40 mg Oral QHS  . rifampin  300 mg Oral Q8H  . vitamin B-12  1,000 mcg Oral Daily  . warfarin  10 mg Oral q1800  . 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 Patient was admitted for left sided weakness. 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 Patient is being treated w/ heparin bridge until 2 consecutive therapeutic INR values.  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  Warfarin: 2/27 INR 1.17 warfarin 10 mg 2/28 INR 1.20 will continue warfarin 10 mg tonight and recheck daily INRs  Will continue heparin bridge until two consecutive therpeutic INRs are achieved.  2/28: HL @ 20:00 = 0.26 Will order heparin 1500 units IV X 1 and increase drip rate to 1900 units/hr. Will recheck HL 6 hrs after rate change.   Thank you for this consult.  [place] D Clinical Pharmacist 12/16/2016

## 2016-12-17 DIAGNOSIS — I639 Cerebral infarction, unspecified: Secondary | ICD-10-CM | POA: Diagnosis not present

## 2016-12-17 DIAGNOSIS — I10 Essential (primary) hypertension: Secondary | ICD-10-CM | POA: Diagnosis not present

## 2016-12-17 DIAGNOSIS — I330 Acute and subacute infective endocarditis: Secondary | ICD-10-CM | POA: Diagnosis not present

## 2016-12-17 DIAGNOSIS — I4891 Unspecified atrial fibrillation: Secondary | ICD-10-CM | POA: Diagnosis not present

## 2016-12-17 LAB — GLUCOSE, CAPILLARY
GLUCOSE-CAPILLARY: 164 mg/dL — AB (ref 65–99)
GLUCOSE-CAPILLARY: 170 mg/dL — AB (ref 65–99)
Glucose-Capillary: 136 mg/dL — ABNORMAL HIGH (ref 65–99)
Glucose-Capillary: 147 mg/dL — ABNORMAL HIGH (ref 65–99)

## 2016-12-17 LAB — LIPID PANEL
CHOL/HDL RATIO: 3.4 ratio
Cholesterol: 163 mg/dL (ref 0–200)
HDL: 48 mg/dL (ref >40)
LDL CALC: 85 mg/dL (ref 0–99)
TRIGLYCERIDES: 152 mg/dL — AB (ref <150)
VLDL: 30 mg/dL (ref 0–40)

## 2016-12-17 LAB — PROTIME-INR
INR: 1.26
Prothrombin Time: 15.9 seconds — ABNORMAL HIGH (ref 11.4–15.2)

## 2016-12-17 LAB — HEPARIN LEVEL (UNFRACTIONATED)
HEPARIN UNFRACTIONATED: 0.47 [IU]/mL (ref 0.30–0.70)
HEPARIN UNFRACTIONATED: 0.4 [IU]/mL (ref 0.30–0.70)
Heparin Unfractionated: 0.35 IU/mL (ref 0.30–0.70)

## 2016-12-17 NOTE — Care Management Important Message (Signed)
Important Message  Patient Details  Name: [name] MRN: [id number] Date of Birth: 12-10-1945   Medicare Important Message Given:  Yes    Shelbie Ammons, RN 12/17/2016, 8:50 AM

## 2016-12-17 NOTE — Progress Notes (Signed)
Notified Dr Verdell Carmine that per PT during ambulation pt speech became slurred and L arm slipped from the walker for few moments, then resolved. Currently neuro checks WDL. No orders received at this time.

## 2016-12-17 NOTE — Progress Notes (Signed)
ANTICOAGULATION CONSULT NOTE - Initial Consult  Pharmacy Consult for heparin and warfarin dosing Indication: atrial fibrillation and cardioembolic stroke       Allergies  Allergen Reactions  . Rofecoxib Other (See Comments)    ulcers  . Tetracyclines & Related     Patient Measurements: Height: '5\' 10"'$  (177.8 cm) Weight: 268 lb (121.6 kg) IBW/kg (Calculated) : 73 Heparin Dosing Weight: 100.3 kg  Vital Signs: Temp: 97.5 F (36.4 C) (03/01 0406) Temp Source: Oral (03/01 0406) BP: 113/56 (03/01 0406) Pulse Rate: 89 (03/01 0406)  Labs:  Recent Labs (last 2 labs)    Recent Labs  12/15/16 1233 12/15/16 1300 12/15/16 1917  12/16/16 0227 12/16/16 1155 12/16/16 2004 12/17/16 0330  HGB 12.4*  --   --   --   --   --   --   --   HCT 35.5*  --   --   --   --   --   --   --   PLT 203  --   --   --   --   --   --   --   APTT  --   --  29  --   --   --   --   --   LABPROT  --  15.0  --   --  15.3*  --   --  15.9*  INR  --  1.17  --   --  1.20  --   --  1.26  HEPARINUNFRC  --   --   --   < > 0.13* 0.34 0.26* 0.47  CREATININE 1.19  --   --   --   --   --   --   --   < > = values in this interval not displayed.    Estimated Creatinine Clearance: 75.5 mL/min (by C-G formula based on SCr of 1.19 mg/dL).   Medical History:     Past Medical History:  Diagnosis Date  . Chronic pain   . Coronary artery disease   . Diabetes mellitus without complication ([place])   . DJD (degenerative joint disease)   . GERD (gastroesophageal reflux disease)   . Hyperlipemia   . Hypertension   . Insomnia   . Neuropathy (HCC)     Medications:  Scheduled:  . amiodarone  200 mg Oral Daily  . aspirin EC  81 mg Oral Daily  . DULoxetine  60 mg Oral QHS  . feeding supplement (ENSURE ENLIVE)  237 mL Oral BID BM  . furosemide  40 mg Oral Daily  . gabapentin  1,200 mg Oral TID  . insulin aspart  0-5 Units Subcutaneous QHS  . insulin aspart  0-9 Units Subcutaneous TID WC   . lisinopril  10 mg Oral Daily  . loratadine  10 mg Oral Daily  . methadone  5 mg Oral BID  . metoprolol tartrate  25 mg Oral BID  . multivitamin with minerals  1 tablet Oral Daily  . Oxcarbazepine  300 mg Oral BID  . pantoprazole  40 mg Oral Daily  . pravastatin  40 mg Oral QHS  . rifampin  300 mg Oral Q8H  . vitamin B-12  1,000 mcg Oral Daily  . warfarin  10 mg Oral q1800  . 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 Patient was admitted for left sided weakness. 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 Patient is being treated w/ heparin bridge until 2 consecutive therapeutic INR values.  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  Warfarin: 2/27 INR 1.17 warfarin 10 mg 2/28 INR 1.20 will continue warfarin 10 mg tonight and recheck daily INRs 3/1 INR 1.26 will continue warfarin 10 mg and follow-up daily INRs  Will continue heparin bridge until two consecutive therpeutic INRs are achieved.  2/28: HL @ 20:00 = 0.26 Will order heparin 1500 units IV X 1 and increase drip rate to 1900 units/hr. Will recheck HL 6 hrs after rate change.   3/1 0330 HL therapeutic x 1. Continue current rate. Will recheck HL in 8 hours.  3/1 1430 HL therapeutic @ 0.35 will continue current rate and will recheck HL 3/1 @ 2030.  3/1 HL @ 20:17 = 0.4.  Will continue this pt on current rate and recheck HL on 3/2 with AM labs.   Thank  you for this consult.  [place] Pharmacist 12/17/2016

## 2016-12-17 NOTE — Progress Notes (Signed)
ANTICOAGULATION CONSULT NOTE - Initial Consult  Pharmacy Consult for heparin and warfarin dosing Indication: atrial fibrillation and cardioembolic stroke       Allergies  Allergen Reactions  . Rofecoxib Other (See Comments)    ulcers  . Tetracyclines & Related     Patient Measurements: Height: '5\' 10"'$  (177.8 cm) Weight: 268 lb (121.6 kg) IBW/kg (Calculated) : 73 Heparin Dosing Weight: 100.3 kg  Vital Signs: Temp: 97.5 F (36.4 C) (03/01 0406) Temp Source: Oral (03/01 0406) BP: 113/56 (03/01 0406) Pulse Rate: 89 (03/01 0406)  Labs:  Recent Labs (last 2 labs)    Recent Labs  12/15/16 1233 12/15/16 1300 12/15/16 1917  12/16/16 0227 12/16/16 1155 12/16/16 2004 12/17/16 0330  HGB 12.4*  --   --   --   --   --   --   --   HCT 35.5*  --   --   --   --   --   --   --   PLT 203  --   --   --   --   --   --   --   APTT  --   --  29  --   --   --   --   --   LABPROT  --  15.0  --   --  15.3*  --   --  15.9*  INR  --  1.17  --   --  1.20  --   --  1.26  HEPARINUNFRC  --   --   --   < > 0.13* 0.34 0.26* 0.47  CREATININE 1.19  --   --   --   --   --   --   --   < > = values in this interval not displayed.    Estimated Creatinine Clearance: 75.5 mL/min (by C-G formula based on SCr of 1.19 mg/dL).   Medical History:     Past Medical History:  Diagnosis Date  . Chronic pain   . Coronary artery disease   . Diabetes mellitus without complication ([place])   . DJD (degenerative joint disease)   . GERD (gastroesophageal reflux disease)   . Hyperlipemia   . Hypertension   . Insomnia   . Neuropathy (HCC)     Medications:  Scheduled:  . amiodarone  200 mg Oral Daily  . aspirin EC  81 mg Oral Daily  . DULoxetine  60 mg Oral QHS  . feeding supplement (ENSURE ENLIVE)  237 mL Oral BID BM  . furosemide  40 mg Oral Daily  . gabapentin  1,200 mg Oral TID  . insulin aspart  0-5 Units Subcutaneous QHS  . insulin aspart  0-9 Units Subcutaneous TID WC   . lisinopril  10 mg Oral Daily  . loratadine  10 mg Oral Daily  . methadone  5 mg Oral BID  . metoprolol tartrate  25 mg Oral BID  . multivitamin with minerals  1 tablet Oral Daily  . Oxcarbazepine  300 mg Oral BID  . pantoprazole  40 mg Oral Daily  . pravastatin  40 mg Oral QHS  . rifampin  300 mg Oral Q8H  . vitamin B-12  1,000 mcg Oral Daily  . warfarin  10 mg Oral q1800  . 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 Patient was admitted for left sided weakness. 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 Patient is being treated w/ heparin bridge until 2 consecutive therapeutic INR values.  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  Warfarin: 2/27 INR 1.17 warfarin 10 mg 2/28 INR 1.20 will continue warfarin 10 mg tonight and recheck daily INRs 3/1 INR 1.26 will continue warfarin 10 mg and follow-up daily INRs  Will continue heparin bridge until two consecutive therpeutic INRs are achieved.  2/28: HL @ 20:00 = 0.26 Will order heparin 1500 units IV X 1 and increase drip rate to 1900 units/hr. Will recheck HL 6 hrs after rate change.   3/1 0330 HL therapeutic x 1. Continue current rate. Will recheck HL in 8 hours.  3/1 1430 HL therapeutic @ 0.35 will continue current rate and will recheck HL 3/1 @ 2030.  Thank you for this consult.  Tobie Lords, PharmD, BCPS Clinical Pharmacist 12/17/2016

## 2016-12-17 NOTE — Progress Notes (Addendum)
[facility] NAME: [name]    MR#:  [id number]  DATE OF BIRTH:  Jul 10, 1946  SUBJECTIVE:   Patient here due to slurred speech, left-sided weakness and noted to have an acute CVA. Patient's MRI is showing Punctate focus of acute ischemia within the superior left parietal lobe and linear area of acute to early subacute ischemia along the right external capsule.  Slurred speech resolved.  Had some episodic left sided weakness when ambulating with PT but resolved.  INR still subtherapeutic.  REVIEW OF SYSTEMS:    Review of Systems  Constitutional: Negative for chills and fever.  HENT: Negative for congestion and tinnitus.   Eyes: Negative for blurred vision and double vision.  Respiratory: Negative for cough, shortness of breath and wheezing.   Cardiovascular: Negative for chest pain, orthopnea and PND.  Gastrointestinal: Negative for abdominal pain, diarrhea, nausea and vomiting.  Genitourinary: Negative for dysuria and hematuria.  Neurological: Negative for dizziness, sensory change and focal weakness.  All other systems reviewed and are negative.   Nutrition: Heart Healthy Tolerating Diet: Yes Tolerating PT: Await Eval    DRUG ALLERGIES:   Allergies  Allergen Reactions  . Rofecoxib Other (See Comments)    ulcers  . Tetracyclines & Related     VITALS:  Blood pressure 108/63, pulse 76, temperature 98.3 F (36.8 C), temperature source Oral, resp. rate 16, height '5\' 10"'$  (1.778 m), weight 121.6 kg (268 lb), SpO2 97 %.  PHYSICAL EXAMINATION:   Physical Exam  GENERAL:  71 y.o.-year-old patient sitting up in chair in no acute distress.  EYES: Pupils equal, round, reactive to light and accommodation. No scleral icterus. Extraocular muscles intact.  HEENT: Head atraumatic, normocephalic. Oropharynx and nasopharynx clear.  NECK:  Supple, no jugular venous distention. No thyroid enlargement, no tenderness.  LUNGS: Normal  breath sounds bilaterally, no wheezing, rales, rhonchi. No use of accessory muscles of respiration.  CARDIOVASCULAR: S1, S2 Irregular. No murmurs, rubs, or gallops.  ABDOMEN: Soft, nontender, nondistended. Bowel sounds present. No organomegaly or mass.  EXTREMITIES: No cyanosis, clubbing or edema b/l.    NEUROLOGIC: Cranial nerves II through XII are intact. No focal Motor or sensory deficits b/l.   PSYCHIATRIC: The patient is alert and oriented x 3.  SKIN: No obvious rash, lesion, or ulcer.    LABORATORY PANEL:   CBC  Recent Labs Lab 12/15/16 1233  WBC 10.6  HGB 12.4*  HCT 35.5*  PLT 203   ------------------------------------------------------------------------------------------------------------------  Chemistries   Recent Labs Lab 12/15/16 1233  NA 138  K 4.0  CL 106  CO2 24  GLUCOSE 61*  BUN 19  CREATININE 1.19  CALCIUM 9.2   ------------------------------------------------------------------------------------------------------------------  Cardiac Enzymes No results for input(s): TROPONINI in the last 168 hours. ------------------------------------------------------------------------------------------------------------------  RADIOLOGY:  Ct Angio Head W Or Wo Contrast  Addendum Date: 12/15/2016   ADDENDUM REPORT: 12/15/2016 15:16 ADDENDUM: Study discussed by telephone with Dr. Fritzi Mandes on 12/15/2016 at 1503 hours. Electronically Signed   By: Genevie Ann M.D.   On: 12/15/2016 15:16   Result Date: 12/15/2016 CLINICAL DATA:  71 year old male with left side weakness since 0400 hours today. Initial encounter. EXAM: CT ANGIOGRAPHY HEAD AND NECK TECHNIQUE: Multidetector CT imaging of the head and neck was performed using the standard protocol during bolus administration of intravenous contrast. Multiplanar CT image reconstructions and MIPs were obtained to evaluate the vascular anatomy. Carotid stenosis measurements (when applicable) are obtained utilizing NASCET criteria,  using  the distal internal carotid diameter as the denominator. CONTRAST:  75 mL Isovue 370 COMPARISON:  Head CT without contrast 1256 hours today. Brain MRI with intracranial MRA 03/07/2008 FINDINGS: CTA NECK Skeleton: Extensive previous cervical spine ACDF. Previous median sternotomy. No acute osseous abnormality identified. Visualized paranasal sinuses and mastoids are stable and well pneumatized. Upper chest: Incidental azygos fissure. Mild mosaic attenuation in the visualized bilateral lung parenchyma. No superior mediastinal lymphadenopathy. Other neck: Thyroid, larynx, pharynx, parapharyngeal spaces, retropharyngeal space, sublingual space, submandibular glands and parotid glands are within normal limits. No cervical lymphadenopathy. Aortic arch: Bovine type arch configuration. Mild to moderate aortic arch calcified atherosclerosis. Right carotid system: No brachiocephalic or right CCA origin stenosis despite soft and calcified plaque. Mildly tortuous proximal right ICA. Widely patent right carotid bifurcation with minimal soft plaque. Mildly tortuous cervical right ICA. Left carotid system: Bovine type left CCA origin with no origin stenosis. Mild circumferential soft plaque in the left CCA. Widely patent left carotid bifurcation. Mild left ICA bulb soft and calcified plaque without stenosis. Mildly tortuous cervical left ICA. Vertebral arteries:Mild tortuosity of the proximal right subclavian artery with soft and calcified plaque but no stenosis. Calcified plaque at the right vertebral artery origin with mild to moderate origin stenosis (Series 7, image 167). Otherwise negative right vertebral artery to the skullbase. No significant proximal left subclavian artery stenosis despite soft and calcified plaque. Calcified plaque at the left vertebral artery origin with severe origin stenosis (series 8, image 137). The left vertebral artery is mildly non dominant and otherwise negative to the skullbase. CTA HEAD  Posterior circulation: Dominant distal right vertebral artery with no stenosis to the vertebrobasilar junction. Normal right PICA origin. Calcified plaque in the non dominant distal left V4 segment distal to the low PICA origin which remains normal. Subsequent moderate distal left vertebral stenosis, but the vessel remains patent to the vertebrobasilar junction. No basilar stenosis. Patent SCA and PCA origins. Both posterior communicating arteries are present but small. Right PCA branches are normal. There is mild irregularity without significant stenosis in the left P1 segment. Left PCA branches are within normal limits. Anterior circulation: Both ICA siphons are patent. Minimal siphon calcified plaque without stenosis. Normal bilateral ophthalmic and posterior communicating artery origins. Patent carotid termini. Normal MCA and ACA origins. Anterior communicating artery and bilateral ACA branches are normal with a prominent median artery of the corpus callosum (normal variant). Left MCA M1 segment, trifurcation, and left MCA branches are normal. Right MCA M1 segment, trifurcation, and proximal M2 branches are normal. There is a severe stenosis at the bifurcation of a posterior right MCA M2 branch best seen on series 8, image 61 (also series 11, image 14 and series 6, image 100), but the downstream branches remain patent. Venous sinuses: Patent. Anatomic variants: Dominant right vertebral artery. Median artery of the corpus callosum. Bovine type aortic arch configuration. Delayed phase: Small age indeterminate cortically based infarct at the right sensory strip (series 12, image 24), might have been present in December 2017. Otherwise no cortically based infarct is evident in the right MCA territory. No acute intracranial hemorrhage identified. No intracranial mass effect. No abnormal enhancement identified. Review of the MIP images confirms the above findings IMPRESSION: 1. Negative for emergent large vessel  occlusion but positive for filling defect at the bifurcation of a right MCA middle M2 branch. Despite this severe stenosis the downstream M3 branches remain patent. 2. Otherwise mild for age intra- and extracranial carotid and anterior circulation atherosclerosis. 3. Dominant  right vertebral artery with up to moderate stenosis at its origin. Severe stenosis of the non dominant left vertebral artery at both its origin and distal V4 segment. The left PICA remains patent. 4. Mild atherosclerosis without stenosis of the left PCA P1 segment. 5. Small age indeterminate cortical infarct in the posterior right MCA territory sensory strip. No other right MCA territory ischemic changes by CT. 6. Mild mosaic attenuation in the upper lungs may indicate gas trapping. 7.  Calcified aortic atherosclerosis. Electronically Signed: By: Genevie Ann M.D. On: 12/15/2016 14:59   Ct Head Wo Contrast  Result Date: 12/15/2016 CLINICAL DATA:  71 year old male with slurred speech and left arm weakness since 4 a.m. today. Two recent episodes of sepsis since December 2017. Diaphoresis. EXAM: CT HEAD WITHOUT CONTRAST TECHNIQUE: Contiguous axial images were obtained from the base of the skull through the vertex without intravenous contrast. COMPARISON:  Head CT 10/02/2016. FINDINGS: Brain: Patchy and confluent areas of decreased attenuation are noted throughout the deep and periventricular white matter of the cerebral hemispheres bilaterally, compatible with chronic microvascular ischemic disease. No evidence of acute infarction, hemorrhage, hydrocephalus, extra-axial collection or mass lesion/mass effect. Vascular: No hyperdense vessel or unexpected calcification. Skull: Normal. Negative for fracture or focal lesion. Sinuses/Orbits: No acute finding. Other: None. IMPRESSION: 1. No acute intracranial abnormalities. 2. Chronic microvascular ischemic changes in the cerebral white matter. Electronically Signed   By: Vinnie Langton M.D.   On:  12/15/2016 13:04   Ct Angio Neck W Or Wo Contrast  Addendum Date: 12/15/2016   ADDENDUM REPORT: 12/15/2016 15:16 ADDENDUM: Study discussed by telephone with Dr. Fritzi Mandes on 12/15/2016 at 1503 hours. Electronically Signed   By: Genevie Ann M.D.   On: 12/15/2016 15:16   Result Date: 12/15/2016 CLINICAL DATA:  71 year old male with left side weakness since 0400 hours today. Initial encounter. EXAM: CT ANGIOGRAPHY HEAD AND NECK TECHNIQUE: Multidetector CT imaging of the head and neck was performed using the standard protocol during bolus administration of intravenous contrast. Multiplanar CT image reconstructions and MIPs were obtained to evaluate the vascular anatomy. Carotid stenosis measurements (when applicable) are obtained utilizing NASCET criteria, using the distal internal carotid diameter as the denominator. CONTRAST:  75 mL Isovue 370 COMPARISON:  Head CT without contrast 1256 hours today. Brain MRI with intracranial MRA 03/07/2008 FINDINGS: CTA NECK Skeleton: Extensive previous cervical spine ACDF. Previous median sternotomy. No acute osseous abnormality identified. Visualized paranasal sinuses and mastoids are stable and well pneumatized. Upper chest: Incidental azygos fissure. Mild mosaic attenuation in the visualized bilateral lung parenchyma. No superior mediastinal lymphadenopathy. Other neck: Thyroid, larynx, pharynx, parapharyngeal spaces, retropharyngeal space, sublingual space, submandibular glands and parotid glands are within normal limits. No cervical lymphadenopathy. Aortic arch: Bovine type arch configuration. Mild to moderate aortic arch calcified atherosclerosis. Right carotid system: No brachiocephalic or right CCA origin stenosis despite soft and calcified plaque. Mildly tortuous proximal right ICA. Widely patent right carotid bifurcation with minimal soft plaque. Mildly tortuous cervical right ICA. Left carotid system: Bovine type left CCA origin with no origin stenosis. Mild  circumferential soft plaque in the left CCA. Widely patent left carotid bifurcation. Mild left ICA bulb soft and calcified plaque without stenosis. Mildly tortuous cervical left ICA. Vertebral arteries:Mild tortuosity of the proximal right subclavian artery with soft and calcified plaque but no stenosis. Calcified plaque at the right vertebral artery origin with mild to moderate origin stenosis (Series 7, image 167). Otherwise negative right vertebral artery to the skullbase. No significant proximal  left subclavian artery stenosis despite soft and calcified plaque. Calcified plaque at the left vertebral artery origin with severe origin stenosis (series 8, image 137). The left vertebral artery is mildly non dominant and otherwise negative to the skullbase. CTA HEAD Posterior circulation: Dominant distal right vertebral artery with no stenosis to the vertebrobasilar junction. Normal right PICA origin. Calcified plaque in the non dominant distal left V4 segment distal to the low PICA origin which remains normal. Subsequent moderate distal left vertebral stenosis, but the vessel remains patent to the vertebrobasilar junction. No basilar stenosis. Patent SCA and PCA origins. Both posterior communicating arteries are present but small. Right PCA branches are normal. There is mild irregularity without significant stenosis in the left P1 segment. Left PCA branches are within normal limits. Anterior circulation: Both ICA siphons are patent. Minimal siphon calcified plaque without stenosis. Normal bilateral ophthalmic and posterior communicating artery origins. Patent carotid termini. Normal MCA and ACA origins. Anterior communicating artery and bilateral ACA branches are normal with a prominent median artery of the corpus callosum (normal variant). Left MCA M1 segment, trifurcation, and left MCA branches are normal. Right MCA M1 segment, trifurcation, and proximal M2 branches are normal. There is a severe stenosis at the  bifurcation of a posterior right MCA M2 branch best seen on series 8, image 61 (also series 11, image 14 and series 6, image 100), but the downstream branches remain patent. Venous sinuses: Patent. Anatomic variants: Dominant right vertebral artery. Median artery of the corpus callosum. Bovine type aortic arch configuration. Delayed phase: Small age indeterminate cortically based infarct at the right sensory strip (series 12, image 24), might have been present in December 2017. Otherwise no cortically based infarct is evident in the right MCA territory. No acute intracranial hemorrhage identified. No intracranial mass effect. No abnormal enhancement identified. Review of the MIP images confirms the above findings IMPRESSION: 1. Negative for emergent large vessel occlusion but positive for filling defect at the bifurcation of a right MCA middle M2 branch. Despite this severe stenosis the downstream M3 branches remain patent. 2. Otherwise mild for age intra- and extracranial carotid and anterior circulation atherosclerosis. 3. Dominant right vertebral artery with up to moderate stenosis at its origin. Severe stenosis of the non dominant left vertebral artery at both its origin and distal V4 segment. The left PICA remains patent. 4. Mild atherosclerosis without stenosis of the left PCA P1 segment. 5. Small age indeterminate cortical infarct in the posterior right MCA territory sensory strip. No other right MCA territory ischemic changes by CT. 6. Mild mosaic attenuation in the upper lungs may indicate gas trapping. 7.  Calcified aortic atherosclerosis. Electronically Signed: By: Genevie Ann M.D. On: 12/15/2016 14:59   Mr Brain Wo Contrast  Result Date: 12/15/2016 CLINICAL DATA:  Infected endocarditis.  TIA. EXAM: MRI HEAD WITHOUT CONTRAST TECHNIQUE: Multiplanar, multiecho pulse sequences of the brain and surrounding structures were obtained without intravenous contrast. COMPARISON:  Head CT 12/15/2016 Brain MRI  03/07/2008 FINDINGS: Brain: There is linear restricted diffusion along the right external capsule. There is an additional punctate focus of abnormal DWI within the superior left parietal lobe. There is multifocal hyperintense T2-weighted signal within the periventricular white matter, most often seen in the setting of chronic microvascular ischemia. No mass lesion or midline shift. No hydrocephalus or extra-axial fluid collection. The midline structures are normal. No age advanced or lobar predominant atrophy. Vascular: Major intracranial arterial and venous sinus flow voids are preserved. No evidence of chronic microhemorrhage or amyloid  angiopathy. Skull and upper cervical spine: The visualized skull base, calvarium, upper cervical spine and extracranial soft tissues are normal. Sinuses/Orbits: No fluid levels or advanced mucosal thickening. No mastoid effusion. Normal orbits. IMPRESSION: 1. Punctate focus of acute ischemia within the superior left parietal lobe and linear area of acute to early subacute ischemia along the right external capsule. The distribution of lesions is suggestive of a central cardio embolic process. No hemorrhage or mass effect. 2. Chronic microvascular ischemia. Electronically Signed   By: Ulyses Jarred M.D.   On: 12/15/2016 18:20     ASSESSMENT AND PLAN:   71 year old male with past medical history of chronic atrial fibrillation, recent history of aortic valve endocarditis, neuropathy, hypertension, hyperlipidemia, GERD, diabetes, who presented to the hospital due to slurred speech, left-sided facial droop and weakness in the left lower ext.   1. Acute CVA - this is the cause of patient's acute neurologic symptoms. Patient's MRI showed Punctate focus of acute ischemia within the superior left parietal lobe and linear area of acute to early subacute ischemia along the right external capsule. - likely cardioembolic given subtherapeutic INR.  - cont. Heparin gtt and Coumadin for  now.  As per Case Management Xarelto should only cost $40/month for him and discussed that with him, but he says his pharmacy told him it would be $140/month.  Advised pt. To call insurance company and pharmacy to clear this up.  He would benefit from being on a DOAC instead of coumadin due to fluctuating INR levels.  - appreciate Neuro input.  Seen by PT and they recommend Home health services.   2. Hx of chronic a. Fib - rate controlled and cont. Metoprolol, Amiodarone - cont. Heparin gtt, coumadin. D/c tele.    3. DM Type II - will d/c Amaryl, Metformin.  - place on SSI for now. Appreciate Diabetes coordinator input.   4. Recent hx of Aortic Valve endocarditis - clinically not septic.  - cont. Nafcillin, Rifampin.  Follows w/ Dr. Ola Spurr as outpatient.   5. Chronic Pain - cont. Methadone  6. Essential HTN - cont. Lisinopril, Metoprolol.   7. GERD - cont. Protonix.   8. Hyperlipidemia - cont. Pravachol.    9. Depression - cont. Cymbalta.    All the records are reviewed and case discussed with Care Management/Social Worker. Management plans discussed with the patient, family and they are in agreement.  CODE STATUS: Full Code  DVT Prophylaxis: Heparin gtt, Coumadin  TOTAL TIME TAKING CARE OF THIS PATIENT: 30 minutes.   POSSIBLE D/C IN 1-2 DAYS, DEPENDING ON CLINICAL CONDITION.   Henreitta Leber M.D on 12/17/2016 at 12:44 PM  Between 7am to 6pm - Pager - [phone]  After 6pm go to [email or web address] - Proofreader  [facility]  Office  [phone]  CC: Primary care physician; Idelle Crouch, MD

## 2016-12-17 NOTE — Progress Notes (Signed)
Occupational Therapy Treatment Patient Details Name: [name] MRN: [id number] DOB: 1946-09-05 Today's Date: 12/17/2016    History of present illness Pt is a pleasant 71 yo male, pt admitted to [facility] w/ L sided weakness and slurred speech, MR Imaging positive for stroke. Pt was admitted to [facility] critical care earlier this month (11/19/16) for septic shock and respiratory failure. PMH includes; mitral and aortic valve replacement, chronic A-fib, CAD, diabetes, HTN, and polyneuropathy.    OT comments  Pt seen while sitting up in recliner and was disappointed that he had to stay another 2 or 3 days.  Spoke to PT earlier about his L side becoming flaccid and gaze being stagnant and pt indicated that this has been going on for a while and his doctor was aware of it.  No episodes observed during session.  Pt seen to review use of button hook with demonstration and education in theraputty exercises using green firm putty and a list of fine motor exercises to do with L hand.  REc weight bearing with wrist flexed and fingers extended after doing exercises to decrease tone.  Follow Up Recommendations       Equipment Recommendations       Recommendations for Other Services      Precautions / Restrictions Precautions Precautions: Fall Restrictions Weight Bearing Restrictions: No       Mobility Bed Mobility                  Transfers                      Balance Overall balance assessment: History of Falls;Needs assistance Sitting-balance support: No upper extremity supported;Feet supported Sitting balance-Leahy Scale: Good Sitting balance - Comments: able to independently sit w/o back support    Standing balance support: Bilateral upper extremity supported;During functional activity Standing balance-Leahy Scale: Good Standing balance comment: Requires use of RW for additional stability UE tends to give way w/ decreased grip strength on the RW                    ADL Overall ADL's : Needs assistance/impaired                                       General ADL Comments: Pt seen to review use of button hook with demonstration and education in theraputty exercises using green firm putty and a list of fine motor exercises to do with L hand.      Vision                     Perception     Praxis      Cognition   Behavior During Therapy: WFL for tasks assessed/performed Overall Cognitive Status: Within Functional Limits for tasks assessed                         Exercises     Shoulder Instructions       General Comments      Pertinent Vitals/ Pain       Pain Assessment: No/denies pain  Home Living                                          Prior  Functioning/Environment              Frequency  Min 1X/week        Progress Toward Goals  OT Goals(current goals can now be found in the care plan section)  Progress towards OT goals: Progressing toward goals     Plan Discharge plan remains appropriate    Co-evaluation                 End of Session Equipment Utilized During Treatment:  (button hook and green putty)  OT Visit Diagnosis: Muscle weakness (generalized) (M62.81);Hemiplegia and hemiparesis Hemiplegia - Right/Left: Left Hemiplegia - dominant/non-dominant: Non-Dominant Hemiplegia - caused by: Other cerebrovascular disease   Activity Tolerance Patient tolerated treatment well   Patient Left in chair;with call bell/phone within reach;with chair alarm set   Nurse Communication          Time: [phone] OT Time Calculation (min): 28 min  Charges: OT General Charges $OT Visit: 1 Procedure OT Treatments $Self Care/Home Management : 8-22 mins $Therapeutic Exercise: 8-22 mins  Chrys Racer, OTR/L ascom [phone] 12/17/16, 3:50 PM

## 2016-12-17 NOTE — Progress Notes (Signed)
ANTICOAGULATION CONSULT NOTE - Initial Consult  Pharmacy Consult for heparin and warfarin dosing Indication: atrial fibrillation and cardioembolic stroke  Allergies  Allergen Reactions  . Rofecoxib Other (See Comments)    ulcers  . Tetracyclines & Related     Patient Measurements: Height: '5\' 10"'$  (177.8 cm) Weight: 268 lb (121.6 kg) IBW/kg (Calculated) : 73 Heparin Dosing Weight: 100.3 kg  Vital Signs: Temp: 97.5 F (36.4 C) (03/01 0406) Temp Source: Oral (03/01 0406) BP: 113/56 (03/01 0406) Pulse Rate: 89 (03/01 0406)  Labs:  Recent Labs  12/15/16 1233 12/15/16 1300 12/15/16 1917  12/16/16 0227 12/16/16 1155 12/16/16 2004 12/17/16 0330  HGB 12.4*  --   --   --   --   --   --   --   HCT 35.5*  --   --   --   --   --   --   --   PLT 203  --   --   --   --   --   --   --   APTT  --   --  29  --   --   --   --   --   LABPROT  --  15.0  --   --  15.3*  --   --  15.9*  INR  --  1.17  --   --  1.20  --   --  1.26  HEPARINUNFRC  --   --   --   < > 0.13* 0.34 0.26* 0.47  CREATININE 1.19  --   --   --   --   --   --   --   < > = values in this interval not displayed.  Estimated Creatinine Clearance: 75.5 mL/min (by C-G formula based on SCr of 1.19 mg/dL).   Medical History: Past Medical History:  Diagnosis Date  . Chronic pain   . Coronary artery disease   . Diabetes mellitus without complication ([place])   . DJD (degenerative joint disease)   . GERD (gastroesophageal reflux disease)   . Hyperlipemia   . Hypertension   . Insomnia   . Neuropathy ([facility])     Medications:  Scheduled:  . amiodarone  200 mg Oral Daily  . aspirin EC  81 mg Oral Daily  . DULoxetine  60 mg Oral QHS  . feeding supplement (ENSURE ENLIVE)  237 mL Oral BID BM  . furosemide  40 mg Oral Daily  . gabapentin  1,200 mg Oral TID  . insulin aspart  0-5 Units Subcutaneous QHS  . insulin aspart  0-9 Units Subcutaneous TID WC  . lisinopril  10 mg Oral Daily  . loratadine  10 mg Oral Daily  .  methadone  5 mg Oral BID  . metoprolol tartrate  25 mg Oral BID  . multivitamin with minerals  1 tablet Oral Daily  . Oxcarbazepine  300 mg Oral BID  . pantoprazole  40 mg Oral Daily  . pravastatin  40 mg Oral QHS  . rifampin  300 mg Oral Q8H  . vitamin B-12  1,000 mcg Oral Daily  . warfarin  10 mg Oral q1800  . 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 Patient was admitted for left sided weakness. MRI of brain showed  acute ischemic infarct. Patient is being treated w/ heparin bridge until 2 consecutive therapeutic INR values.  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  Warfarin: 2/27 INR 1.17 warfarin 10 mg 2/28 INR 1.20 will continue warfarin 10 mg tonight and recheck daily INRs  Will continue heparin bridge until two consecutive therpeutic INRs are achieved.  2/28: HL @ 20:00 = 0.26 Will order heparin 1500 units IV X 1 and increase drip rate to 1900 units/hr. Will recheck HL 6 hrs after rate change.   3/1 0330 HL therapeutic x 1. Continue current rate. Will recheck HL in 8 hours.  Thank you for this consult.  Laural Benes, Pharm.D., BCPS Clinical Pharmacist 12/17/2016

## 2016-12-17 NOTE — Evaluation (Signed)
Occupational Therapy Evaluation Patient Details Name: [name] MRN: [id number] DOB: 29-Mar-1946 Today's Date: 12/17/2016    History of Present Illness Pt is a pleasant 71 yo male, pt admitted to [facility] w/ L sided weakness and slurred speech, MR Imaging positive for stroke. Pt was admitted to [facility] critical care earlier this month (11/19/16) for septic shock and respiratory failure. PMH includes; mitral and aortic valve replacement, chronic A-fib, CAD, diabetes, HTN, and polyneuropathy.    Clinical Impression   Pt is 71 year old male who presents with new onset of L sided weakness and slurred speech with MRI positive for new stroke (see above for history).  Pt is alert and oriented X3 and eager to recover and regain independence and use of L UE again.  He presents with active movement with slightly decreased finger to nose test, numbness in L hand and impaired proprioception and coordination.  He would benefit from instruction in use of a button hook and elastic shoe laces and theraputty exercises.  Rec continued OT while in hospital to continue to work on increasing independence in ADLs, balance and functional mobility training, coordination exercises and family ed and training.  He would like to continue [facility] PT to work on strengthening to see if he needs any further intervention for fine motor skills and coordination of L hand.  Rec Outpatient OT to continue after he completes [facility] PT if still needed to regain independence in ADLs.  Will discuss with SW about rec.  Follow Up Recommendations  Outpatient OT    Equipment Recommendations   (button hook, elastic shoe laces, theraputty)    Recommendations for Other Services       Precautions / Restrictions Precautions Precautions: Fall Restrictions Weight Bearing Restrictions: No      Mobility Bed Mobility                  Transfers                      Balance                                             ADL Overall ADL's : Needs assistance/impaired Eating/Feeding: Modified independent;Set up   Grooming: Wash/dry hands;Wash/dry face;Oral care;Brushing hair;Independent;Set up           Upper Body Dressing : Set up;Min guard Upper Body Dressing Details (indicate cue type and reason): unable to complete buttons---rec button hook  Lower Body Dressing: Set up;Min guard Lower Body Dressing Details (indicate cue type and reason): Difficulty with tying shoes only and needs extra time to use L hand with R for bilateral tasks like putting on and removing socks Toilet Transfer: Supervision/safety;Set up                   Vision Baseline Vision/History: Wears glasses Wears Glasses: At all times Patient Visual Report: No change from baseline Vision Assessment?: No apparent visual deficits     Perception     Praxis      Pertinent Vitals/Pain Pain Assessment: No/denies pain     Hand Dominance Right   Extremity/Trunk Assessment Upper Extremity Assessment Upper Extremity Assessment: LUE deficits/detail LUE Deficits / Details: impaired proprioception in L hand with mild numbness which is causing him to drop items in his hand like his phone or FWW handle.  Strength 4-/5 in  l hand and 4/5 in LUE.  Finger to nose slightly impaired. LUE Sensation: decreased light touch LUE Coordination: decreased fine motor   Lower Extremity Assessment Lower Extremity Assessment: Defer to PT evaluation       Communication Communication Communication: No difficulties   Cognition Arousal/Alertness: Awake/alert Behavior During Therapy: WFL for tasks assessed/performed Overall Cognitive Status: Within Functional Limits for tasks assessed                     General Comments       Exercises       Shoulder Instructions      Home Living Family/patient expects to be discharged to:: Private residence Living Arrangements: Spouse/significant other Available Help at Discharge:  Neighbor;Family;Available PRN/intermittently Type of Home: House Home Access: Stairs to enter [employer] of Steps: 4 Entrance Stairs-Rails: Right Home Layout: One level     Bathroom Shower/Tub: Occupational psychologist: Handicapped height Bathroom Accessibility: Yes How Accessible: Accessible via wheelchair;Accessible via walker Home Equipment: [place] - 2 wheels;Cane - single point;Grab bars - toilet;Grab bars - tub/shower;Hand held shower head;Adaptive equipment;Shower seat Adaptive Equipment: Reacher;Sock aid;Long-handled English as a second language teacher Comments: Son recently renovated bathroom handicapped accessible per previous report by OT      Prior Functioning/Environment Level of Independence: Independent with assistive device(s)  Gait / Transfers Assistance Needed: use of RW for ambulation  ADL's / Homemaking Assistance Needed: Pt was independent in all ADLs prior to admission and assisting with higher level ADLs as needed but his wife has been doing most of it since he had endocarditis.   Comments: Pt stated he is able to transfer and ambulate w/ modified independence, able to ambulate household distances w/ RW prior to admittance         OT Problem List: Decreased strength;Decreased coordination;Impaired sensation;Impaired UE functional use;Decreased knowledge of use of DME or AE      OT Treatment/Interventions: Self-care/ADL training;Patient/family education;DME and/or AE instruction;Therapeutic activities;Neuromuscular education    OT Goals(Current goals can be found in the care plan section) Acute Rehab OT Goals Patient Stated Goal: "go home soon" OT Goal Formulation: With patient Time For Goal Achievement: 12/31/16 Potential to Achieve Goals: Good ADL Goals Pt Will Perform Upper Body Dressing: with set-up;with adaptive equipment;standing;with modified independence Pt Will Perform Lower Body Dressing: with set-up;with modified independence;with adaptive  equipment;sit to/from stand Pt/caregiver will Perform Home Exercise Program: Left upper extremity;Increased strength;With theraputty;Independently;With written HEP provided  OT Frequency: Min 1X/week   Barriers to D/C:            Co-evaluation              End of Session    Activity Tolerance: Patient tolerated treatment well Patient left: in chair;with call bell/phone within reach;with chair alarm set  OT Visit Diagnosis: Muscle weakness (generalized) (M62.81);Hemiplegia and hemiparesis Hemiplegia - Right/Left: Left Hemiplegia - dominant/non-dominant: Non-Dominant Hemiplegia - caused by: Other cerebrovascular disease                ADL either performed or assessed with clinical judgement  Time: 0905-0945 OT Time Calculation (min): 40 min Charges:  OT General Charges $OT Visit: 1 Procedure OT Evaluation $OT Eval Low Complexity: 1 Procedure OT Treatments $Self Care/Home Management : 23-37 mins G-Codes:     Chrys Racer, OTR/L ascom [phone] 12/17/16, 10:20 AM

## 2016-12-17 NOTE — Progress Notes (Signed)
Physical Therapy Treatment Patient Details Name: BARACK NICODEMUS MRN: [id number] DOB: 03/28/1946 Today's Date: 12/17/2016    History of Present Illness Pt is a pleasant 71 yo male, pt admitted to [facility] w/ L sided weakness and slurred speech, MR Imaging positive for stroke. Pt was admitted to [facility] critical care earlier this month (11/19/16) for septic shock and respiratory failure. PMH includes; mitral and aortic valve replacement, chronic A-fib, CAD, diabetes, HTN, and polyneuropathy.     PT Comments    Pt in his recliner stating he may have had "another stroke this morning".  Pt stated that is left arm went weak in the bathroom, speech slurred and he had some difficulty motor planning.  It resolved quickly and he was able to continue to recliner with CNA.  Stated MD had come in to access.  Discussed with RN prior to session and received OK to ambulate.  Stood and was able to ambulate around nursing station with walker and min guard.    During gait, pt had one episode where Left hand slid off walker handle.  Speech became somewhat slurred and he seemed a bit distant for a few seconds.  It resolved quickly and pt asked to continue walking around unit.  He did not have further episodes.  Prior to session, chart was reviewed and it was noted that during yesterdays session he had episodes of LUE slipping off handle at times.   Discussed with nurse and confirmed CNA's report of episode earlier.  While pt overall did well safety concerns remain as fall risk remains elevated especially with TIA like episodes.  Pt should have assist at all times with mobility and possibly consider STR if they continue or impairments increase.   Follow Up Recommendations  Home health PT, supervision with mobility     Equipment Recommendations       Recommendations for Other Services       Precautions / Restrictions Precautions Precautions: Fall Restrictions Weight Bearing Restrictions: No    Mobility  Bed  Mobility               General bed mobility comments: not assessed pt seated in recliner   Transfers Overall transfer level: Modified independent Equipment used: Rolling walker (2 wheeled)   Sit to Stand: Modified independent (Device/Increase time)         General transfer comment: good safety awareness throughout transfer and hand placement on side of chair, use of B UE's for push off, good feet positioning  Ambulation/Gait Ambulation/Gait assistance: Min guard;+2 safety/equipment Ambulation Distance (Feet): 200 Feet Assistive device: Rolling walker (2 wheeled) Gait Pattern/deviations: Step-through pattern;Wide base of support         Stairs            Wheelchair Mobility    Modified Rankin (Stroke Patients Only)       Balance Overall balance assessment: History of Falls;Needs assistance Sitting-balance support: No upper extremity supported;Feet supported Sitting balance-Leahy Scale: Good Sitting balance - Comments: able to independently sit w/o back support    Standing balance support: Bilateral upper extremity supported;During functional activity Standing balance-Leahy Scale: Good Standing balance comment: Requires use of RW for additional stability UE tends to give way w/ decreased grip strength on the RW                    Cognition Arousal/Alertness: Awake/alert Behavior During Therapy: [facility] for tasks assessed/performed Overall Cognitive Status: Within Functional Limits for tasks assessed  Exercises      General Comments        Pertinent Vitals/Pain Pain Assessment: No/denies pain    Home Living Family/patient expects to be discharged to:: Private residence Living Arrangements: Spouse/significant other Available Help at Discharge: Neighbor;Family;Available PRN/intermittently Type of Home: House Home Access: Stairs to enter Entrance Stairs-Rails: Right Home Layout: One level Home Equipment: Walker -  2 wheels;Cane - single point;Grab bars - toilet;Grab bars - tub/shower;Hand held shower head;Adaptive equipment;Shower seat Additional Comments: Son recently renovated bathroom handicapped accessible per previous report by OT    Prior Function Level of Independence: Independent with assistive device(s)  Gait / Transfers Assistance Needed: use of RW for ambulation  ADL's / Homemaking Assistance Needed: Pt was independent in all ADLs prior to admission and assisting with higher level ADLs as needed but his wife has been doing most of it since he had endocarditis. Comments: Pt stated he is able to transfer and ambulate w/ modified independence, able to ambulate household distances w/ RW prior to admittance    PT Goals (current goals can now be found in the care plan section) Acute Rehab PT Goals Patient Stated Goal: "go home soon" Progress towards PT goals: Progressing toward goals    Frequency    7X/week      PT Plan Current plan remains appropriate    Co-evaluation             End of Session Equipment Utilized During Treatment: Gait belt Activity Tolerance: Other (comment) Patient left: in chair;with call bell/phone within reach;with chair alarm set Nurse Communication: Mobility status;Other (comment) Hemiplegia - Right/Left: Left Hemiplegia - dominant/non-dominant: Non-dominant     Time: 1130-1143 PT Time Calculation (min) (ACUTE ONLY): 13 min  Charges:  $Gait Training: 8-22 mins                    G Codes:       Chesley Noon, PTA 12/17/16, 12:10 PM

## 2016-12-17 NOTE — Progress Notes (Signed)
Pt. Apparently cannot be on a NOAC given him being on Rifampin which cannot be stopped due to Endocarditis.    Will cont. Heparin, Coumadin for now and may switch to Lovenox, Coumadin upon discharge until pt. Can finish treatment for endocarditis.   Explained this to pt. And also Case management.

## 2016-12-18 DIAGNOSIS — I639 Cerebral infarction, unspecified: Secondary | ICD-10-CM | POA: Diagnosis not present

## 2016-12-18 DIAGNOSIS — I4891 Unspecified atrial fibrillation: Secondary | ICD-10-CM | POA: Diagnosis not present

## 2016-12-18 DIAGNOSIS — I10 Essential (primary) hypertension: Secondary | ICD-10-CM | POA: Diagnosis not present

## 2016-12-18 DIAGNOSIS — I330 Acute and subacute infective endocarditis: Secondary | ICD-10-CM | POA: Diagnosis not present

## 2016-12-18 LAB — CBC WITH DIFFERENTIAL/PLATELET
Basophils Absolute: 0.1 10*3/uL (ref 0–0.1)
Basophils Relative: 1 %
Eosinophils Absolute: 0.7 10*3/uL (ref 0–0.7)
Eosinophils Relative: 8 %
HEMATOCRIT: 34 % — AB (ref 40.0–52.0)
HEMOGLOBIN: 11.7 g/dL — AB (ref 13.0–18.0)
LYMPHS ABS: 1.5 10*3/uL (ref 1.0–3.6)
Lymphocytes Relative: 18 %
MCH: 29.2 pg (ref 26.0–34.0)
MCHC: 34.4 g/dL (ref 32.0–36.0)
MCV: 84.9 fL (ref 80.0–100.0)
MONOS PCT: 9 %
Monocytes Absolute: 0.8 10*3/uL (ref 0.2–1.0)
NEUTROS ABS: 5.4 10*3/uL (ref 1.4–6.5)
NEUTROS PCT: 64 %
Platelets: 166 10*3/uL (ref 150–440)
RBC: 4.01 MIL/uL — AB (ref 4.40–5.90)
RDW: 18.2 % — ABNORMAL HIGH (ref 11.5–14.5)
WBC: 8.5 10*3/uL (ref 3.8–10.6)

## 2016-12-18 LAB — HEPARIN LEVEL (UNFRACTIONATED): Heparin Unfractionated: 0.39 IU/mL (ref 0.30–0.70)

## 2016-12-18 LAB — PROTIME-INR
INR: 1.31
Prothrombin Time: 16.4 seconds — ABNORMAL HIGH (ref 11.4–15.2)

## 2016-12-18 LAB — GLUCOSE, CAPILLARY
GLUCOSE-CAPILLARY: 155 mg/dL — AB (ref 65–99)
Glucose-Capillary: 131 mg/dL — ABNORMAL HIGH (ref 65–99)

## 2016-12-18 LAB — ECHOCARDIOGRAM COMPLETE
HEIGHTINCHES: 70 in
WEIGHTICAEL: 4288 [oz_av]

## 2016-12-18 LAB — HEMOGLOBIN A1C
HEMOGLOBIN A1C: 6.3 % — AB (ref 4.8–5.6)
MEAN PLASMA GLUCOSE: 134 mg/dL

## 2016-12-18 MED ORDER — ENOXAPARIN SODIUM 120 MG/0.8ML ~~LOC~~ SOLN
1.0000 mg/kg | Freq: Two times a day (BID) | SUBCUTANEOUS | Status: DC
  Filled 2016-12-18: qty 1.6

## 2016-12-18 MED ORDER — WARFARIN - PHARMACIST DOSING INPATIENT: Freq: Every day | Status: DC

## 2016-12-18 MED ORDER — WARFARIN SODIUM 5 MG PO TABS: 10.0000 mg | ORAL_TABLET | Freq: Every day | ORAL | Status: DC

## 2016-12-18 MED ORDER — ENOXAPARIN SODIUM 120 MG/0.8ML ~~LOC~~ SOLN: 1.0000 mg/kg | Freq: Two times a day (BID) | SUBCUTANEOUS | 0 refills | Status: DC

## 2016-12-18 MED ORDER — ENOXAPARIN SODIUM 120 MG/0.8ML ~~LOC~~ SOLN
1.0000 mg/kg | Freq: Two times a day (BID) | SUBCUTANEOUS | Status: DC
  Administered 2016-12-18: 120 mg via SUBCUTANEOUS
  Filled 2016-12-18 (×2): qty 1.6

## 2016-12-18 NOTE — Progress Notes (Signed)
Physical Therapy Treatment Patient Details Name: [name] MRN: [id number] DOB: [birth year] Today's Date: 12/18/2016    History of Present Illness Pt is a pleasant 71 yo male, pt admitted to [facility] w/ L sided weakness and slurred speech, MR Imaging positive for stroke. Pt was admitted to [facility] critical care earlier this month (11/19/16) for septic shock and respiratory failure. PMH includes; mitral and aortic valve replacement, chronic A-fib, CAD, diabetes, HTN, and polyneuropathy.     PT Comments    Pt stated he is feeling much better w/ no new episodes of L hand giving out w/ RW. Reassessed strength and coordination, slightly decreased grip strength on L compared to R and slightly increased dysmetria on L hand during finger to nose testing compared to R. Pt able to safely transfer and ambulate w/ RW. His L hand did not give way today while ambulating around nursing station. Still has some difficulty gripping his phone w/ his L hand but overall has improved. Modified DGI still 10/12 indicative of low fall risk. No signs of slurred speech, or numbness and tingling throughout session. Pt appears to be progressing today and is able to safely perform functional tasks. He does display some L sided weakness and balance deficits and a history of cardiopulmonary impairments that will benefit from skilled PT. Recommend he continue to receive HHPT following acute hospital stay.    Follow Up Recommendations  Home health PT     Equipment Recommendations  None recommended by PT    Recommendations for Other Services       Precautions / Restrictions Precautions Precautions: Fall Restrictions Weight Bearing Restrictions: No    Mobility  Bed Mobility               General bed mobility comments: not assessed pt seated in recliner   Transfers Overall transfer level: Modified independent Equipment used: Rolling walker (2 wheeled) Transfers: Sit to/from Stand Sit to Stand: Modified  independent (Device/Increase time)         General transfer comment: good hand positioning and safety throughout transfers, good LE control descending and ascending from chair  Ambulation/Gait Ambulation/Gait assistance: Min guard Ambulation Distance (Feet): 200 Feet Assistive device: Rolling walker (2 wheeled) Gait Pattern/deviations: Step-through pattern;Wide base of support     General Gait Details: symmetrical step length on R and L w/ no noticeable impairments or signs of weakness, able to speed up and slow down on command, L hand did not slip from walker or give way during ambulation, requires RW for improved stability   Stairs            Wheelchair Mobility    Modified Rankin (Stroke Patients Only)       Balance Overall balance assessment: History of Falls;Needs assistance Sitting-balance support: Feet supported;No upper extremity supported Sitting balance-Leahy Scale: Normal Sitting balance - Comments: able to independently sit w/o back support    Standing balance support: Bilateral upper extremity supported;During functional activity Standing balance-Leahy Scale: Good Standing balance comment: Requires use of RW for additional stability, L hand did not give way during stance               High Level Balance Comments: Modified DGI reassed- 10/12- low fall risk    Cognition Arousal/Alertness: Awake/alert Behavior During Therapy: WFL for tasks assessed/performed Overall Cognitive Status: Within Functional Limits for tasks assessed                      Exercises  General Comments        Pertinent Vitals/Pain Pain Assessment: No/denies pain    Home Living                      Prior Function            PT Goals (current goals can now be found in the care plan section) Acute Rehab PT Goals Patient Stated Goal: "go home soon" PT Goal Formulation: With patient Time For Goal Achievement: 12/30/16 Potential to Achieve  Goals: Good Progress towards PT goals: Progressing toward goals    Frequency    7X/week      PT Plan Current plan remains appropriate    Co-evaluation             End of Session Equipment Utilized During Treatment: Gait belt Activity Tolerance: Patient tolerated treatment well Patient left: in chair;with chair alarm set;with call bell/phone within reach Nurse Communication: Mobility status PT Visit Diagnosis: Muscle weakness (generalized) (M62.81);Hemiplegia and hemiparesis Hemiplegia - Right/Left: Left Hemiplegia - dominant/non-dominant: Non-dominant Hemiplegia - caused by: Cerebral infarction     Time: 2951-8841 PT Time Calculation (min) (ACUTE ONLY): 20 min  Charges:                       G Codes:       [employer] Student PT 12/18/2016, 10:54 AM

## 2016-12-18 NOTE — Plan of Care (Signed)
MD making rounds. Order received to discharge home with [facility]. Case manager facilitating [place].IV removed. RUA picc flushed and clamped. IV removed. Prescription given to patient. Provided with education handouts. No unanswered questions. Discharged via wheelchair by auxiliary staff. Belongings sent with patient and wife.

## 2016-12-18 NOTE — Care Management (Signed)
Discharge to home today per Dr. Heron Sabins. Telephone call to [facility] in [place]. Lovenox '120mg'$  SQ BID. Will need 15 injections. Total costs is $313.00.  This information given to Mr. Minium.  Wife will transport. Shelbie Ammons RN MSN CCM Care Management

## 2016-12-18 NOTE — Progress Notes (Signed)
ANTICOAGULATION CONSULT NOTE - Initial Consult  Pharmacy Consult for heparin and warfarin dosing Indication: atrial fibrillation and cardioembolic stroke       Allergies  Allergen Reactions  . Rofecoxib Other (See Comments)    ulcers  . Tetracyclines & Related     Patient Measurements: Height: '5\' 10"'$  (177.8 cm) Weight: 268 lb (121.6 kg) IBW/kg (Calculated) : 73 Heparin Dosing Weight: 100.3 kg  Vital Signs: Temp: 97.5 F (36.4 C) (03/01 0406) Temp Source: Oral (03/01 0406) BP: 113/56 (03/01 0406) Pulse Rate: 89 (03/01 0406)  Labs:  Recent Labs (last 2 labs)    Recent Labs  12/15/16 1233 12/15/16 1300 12/15/16 1917  12/16/16 0227 12/16/16 1155 12/16/16 2004 12/17/16 0330  HGB 12.4*  --   --   --   --   --   --   --   HCT 35.5*  --   --   --   --   --   --   --   PLT 203  --   --   --   --   --   --   --   APTT  --   --  29  --   --   --   --   --   LABPROT  --  15.0  --   --  15.3*  --   --  15.9*  INR  --  1.17  --   --  1.20  --   --  1.26  HEPARINUNFRC  --   --   --   < > 0.13* 0.34 0.26* 0.47  CREATININE 1.19  --   --   --   --   --   --   --   < > = values in this interval not displayed.    Estimated Creatinine Clearance: 75.5 mL/min (by C-G formula based on SCr of 1.19 mg/dL).   Medical History:     Past Medical History:  Diagnosis Date  . Chronic pain   . Coronary artery disease   . Diabetes mellitus without complication ([place])   . DJD (degenerative joint disease)   . GERD (gastroesophageal reflux disease)   . Hyperlipemia   . Hypertension   . Insomnia   . Neuropathy ([facility])     Medications:  Scheduled:  . amiodarone  200 mg Oral Daily  . aspirin EC  81 mg Oral Daily  . DULoxetine  60 mg Oral QHS  . feeding supplement (ENSURE ENLIVE)  237 mL Oral BID BM  . furosemide  40 mg Oral Daily  . gabapentin  1,200 mg Oral TID  . insulin aspart  0-5 Units Subcutaneous QHS  . insulin aspart  0-9 Units Subcutaneous TID WC   . lisinopril  10 mg Oral Daily  . loratadine  10 mg Oral Daily  . methadone  5 mg Oral BID  . metoprolol tartrate  25 mg Oral BID  . multivitamin with minerals  1 tablet Oral Daily  . Oxcarbazepine  300 mg Oral BID  . pantoprazole  40 mg Oral Daily  . pravastatin  40 mg Oral QHS  . rifampin  300 mg Oral Q8H  . vitamin B-12  1,000 mcg Oral Daily  . warfarin  10 mg Oral q1800  . 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 Patient was admitted for left sided weakness. 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 Patient is being treated w/ heparin bridge until 2 consecutive therapeutic INR values.  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  Warfarin: 2/27 INR 1.17 warfarin 10 mg 2/28 INR 1.20 will continue warfarin 10 mg tonight and recheck daily INRs 3/1 INR 1.26 will continue warfarin 10 mg and follow-up daily INRs  Will continue heparin bridge until two consecutive therpeutic INRs are achieved.  2/28: HL @ 20:00 = 0.26 Will order heparin 1500 units IV X 1 and increase drip rate to 1900 units/hr. Will recheck HL 6 hrs after rate change.   3/1 0330 HL therapeutic x 1. Continue current rate. Will recheck HL in 8 hours.  3/1 1430 HL therapeutic @ 0.35 will continue current rate and will recheck HL 3/1 @ 2030.  3/1 HL @ 20:17 = 0.4.  Will continue this pt on current rate and recheck HL on 3/2 with AM labs.   3/2  0455 HL therapeutic. Continue current rate. Will recheck HL/CBC daily while on heparin.  Thank you for this consult.  Laural Benes, Pharm.D., BCPS Clinical Pharmacist 12/18/2016

## 2016-12-18 NOTE — Discharge Summary (Signed)
[facility] [place] NAME: [name]    MR#:  [id number]  DATE OF BIRTH:  23-Mar-1946  DATE OF ADMISSION:  12/15/2016 ADMITTING PHYSICIAN: Fritzi Mandes, MD  DATE OF DISCHARGE: 12/18/2016   PRIMARY CARE PHYSICIAN: SPARKS,JEFFREY D, MD    ADMISSION DIAGNOSIS:  CVA (cerebral vascular accident) ([place]) [I63.9] Acute left-sided weakness [M62.89]  DISCHARGE DIAGNOSIS:  Active Problems:   TIA (transient ischemic attack)   CVA (cerebral vascular accident) ([place])   SECONDARY DIAGNOSIS:   Past Medical History:  Diagnosis Date  . Chronic pain   . Coronary artery disease   . Diabetes mellitus without complication ([place])   . DJD (degenerative joint disease)   . GERD (gastroesophageal reflux disease)   . Hyperlipemia   . Hypertension   . Insomnia   . Neuropathy [facility])     HOSPITAL COURSE:   71 year old male with past medical history of chronic atrial fibrillation, recent history of aortic valve endocarditis, neuropathy, hypertension, hyperlipidemia, GERD, diabetes, who presented to the hospital due to slurred speech, left-sided facial droop and weakness in the left lower ext.   1. Acute CVA - this was the cause of patient's acute neurologic symptoms. Patient's MRI showed Punctate focus of acute ischemia within the superior left parietal lobe and linear area of acute to early subacute ischemia along the right external capsule. - likely cardioembolic as pt. Presented with subtherapeutic INR.  Pt. Was started on a heparin gtt and also given coumadin.  His INR remains subtherapeutic still and pt. Is now being discharged on Lovenox, Coumadin.  - there was a thought about putting him on Xarelto but he cannot take any NOAC's as pt. Is on Rifampin.   - pt. Was seen by Neurology who agreed with this management.  Pt. Was seen by PT and he will be discharged with Home Health PT services.   2. Hx of chronic a. Fib - rate controlled and cont. Metoprolol,  Amiodarone -- pt. Will be discharged on Lovenox, coumadin for a goal INR of 2-3.    3. DM Type II - while in the hospital pt. Was SSI but resume his Amaryl, Metformin upon discharge.   4. Recent hx of Aortic Valve endocarditis - clinically not septic.  - cont. Nafcillin, Rifampin 01/01/17.   Follows w/ Dr. Ola Spurr as outpatient.   5. Chronic Pain - he willcont. Methadone  6. Essential HTN - he will cont. Lisinopril, Metoprolol.   7. Hyperlipidemia - he will cont. Pravachol.    8. Depression - he will cont. Cymbalta.   Discharge home with Home Health PT, RN.   DISCHARGE CONDITIONS:   Stable.   CONSULTS OBTAINED:  Treatment Team:  Catarina Hartshorn, MD Alexis Goodell, MD Leonel Ramsay, MD  DRUG ALLERGIES:   Allergies  Allergen Reactions  . Rofecoxib Other (See Comments)    ulcers  . Tetracyclines & Related     DISCHARGE MEDICATIONS:   Allergies as of 12/18/2016      Reactions   Rofecoxib Other (See Comments)   ulcers   Tetracyclines & Related       Medication List    TAKE these medications   amiodarone 200 MG tablet Commonly known as:  PACERONE Take 1 tablet (200 mg total) by mouth daily.   aspirin EC 81 MG tablet Take 1 tablet by mouth daily.   cyclobenzaprine 10 MG tablet Commonly known as:  FLEXERIL Take 1 tablet by mouth daily.   DULoxetine 60 MG  capsule Commonly known as:  CYMBALTA Take 1 capsule by mouth at bedtime.   enoxaparin 120 MG/0.8ML injection Commonly known as:  LOVENOX Inject 0.81 mLs (120 mg total) into the skin every 12 (twelve) hours.   feeding supplement (ENSURE ENLIVE) Liqd Take 237 mLs by mouth 2 (two) times daily between meals.   furosemide 40 MG tablet Commonly known as:  LASIX Take 1 tablet by mouth daily.   gabapentin 600 MG tablet Commonly known as:  NEURONTIN Take 1,200 tablets by mouth 3 (three) times daily.   glimepiride 2 MG tablet Commonly known as:  AMARYL Take 2 mg by mouth daily.    ipratropium-albuterol 0.5-2.5 (3) MG/3ML Soln Commonly known as:  DUONEB Take 3 mLs by nebulization every 6 (six) hours as needed.   KLOR-CON M20 20 MEQ tablet Generic drug:  potassium chloride SA Take 1 tablet by mouth daily.   lisinopril 10 MG tablet Commonly known as:  PRINIVIL,ZESTRIL Take 1 tablet by mouth daily.   Loratadine 10 MG Caps Take 1 capsule by mouth daily.   meclizine 25 MG tablet Commonly known as:  ANTIVERT Take 1 tablet by mouth 3 (three) times daily.   metFORMIN 1000 MG tablet Commonly known as:  GLUCOPHAGE Take 1 tablet by mouth 2 (two) times daily. With meals.   methadone 5 MG tablet Commonly known as:  DOLOPHINE Take 5 mg by mouth 2 (two) times daily.   metoprolol tartrate 25 MG tablet Commonly known as:  LOPRESSOR Take 1 tablet by mouth 2 (two) times daily.   MULTI-VITAMINS Tabs Take 1 tablet by mouth daily.   nafcillin 12 g in sodium chloride 0.9 % 250 mL Inject 12 g into the vein continuous.   omeprazole 20 MG capsule Commonly known as:  PRILOSEC Take 20 mg by mouth daily.   Oxcarbazepine 300 MG tablet Commonly known as:  TRILEPTAL Take 1 tablet by mouth 2 (two) times daily.   pravastatin 40 MG tablet Commonly known as:  PRAVACHOL Take 1 tablet by mouth at bedtime.   RA VITAMIN B-12 TR 1000 MCG Tbcr Generic drug:  Cyanocobalamin Take 1 tablet by mouth daily.   rifampin 300 MG capsule Commonly known as:  RIFADIN Take 1 capsule (300 mg total) by mouth every 8 (eight) hours.   warfarin 5 MG tablet Commonly known as:  COUMADIN Take 2 tablets (10 mg total) by mouth daily at 6 PM.         DISCHARGE INSTRUCTIONS:   DIET:  Cardiac diet and Diabetic diet  DISCHARGE CONDITION:  Stable  ACTIVITY:  Activity as tolerated  OXYGEN:  Home Oxygen: No.   Oxygen Delivery: room air  DISCHARGE LOCATION:  Home with [facility], RN.     If you experience worsening of your admission symptoms, develop shortness of breath, life  threatening emergency, suicidal or homicidal thoughts you must seek medical attention immediately by calling 911 or calling your MD immediately  if symptoms less severe.  You Must read complete instructions/literature along with all the possible adverse reactions/side effects for all the Medicines you take and that have been prescribed to you. Take any new Medicines after you have completely understood and accpet all the possible adverse reactions/side effects.   Please note  You were cared for by a hospitalist during your hospital stay. If you have any questions about your discharge medications or the care you received while you were in the hospital after you are discharged, you can call the unit and asked to  speak with the hospitalist on call if the hospitalist that took care of you is not available. Once you are discharged, your primary care physician will handle any further medical issues. Please note that NO REFILLS for any discharge medications will be authorized once you are discharged, as it is imperative that you return to your primary care physician (or establish a relationship with a primary care physician if you do not have one) for your aftercare needs so that they can reassess your need for medications and monitor your lab values.     Today   No acute Neurologic symptoms.  INR remains subtherapeutic.  Will d/c home with [facility] on Lovenox, Coumadin.   VITAL SIGNS:  Blood pressure 122/66, pulse 75, temperature 98.1 F (36.7 C), temperature source Oral, resp. rate 16, height '5\' 10"'$  (1.778 m), weight 121.6 kg (268 lb), SpO2 96 %.  I/O:    Intake/Output Summary (Last 24 hours) at 12/18/16 1404 Last data filed at 12/18/16 0900  Gross per 24 hour  Intake          1871.84 ml  Output             1000 ml  Net           871.84 ml    PHYSICAL EXAMINATION:  GENERAL:  71 y.o.-year-old patient lying in the bed with no acute distress.  EYES: Pupils equal, round, reactive to light  and accommodation. No scleral icterus. Extraocular muscles intact.  HEENT: Head atraumatic, normocephalic. Oropharynx and nasopharynx clear.  NECK:  Supple, no jugular venous distention. No thyroid enlargement, no tenderness.  LUNGS: Normal breath sounds bilaterally, no wheezing, rales,rhonchi. No use of accessory muscles of respiration.  CARDIOVASCULAR: S1, S2 Irregular. No murmurs, rubs, or gallops.  ABDOMEN: Soft, non-tender, non-distended. Bowel sounds present. No organomegaly or mass.  EXTREMITIES: No pedal edema, cyanosis, or clubbing.  NEUROLOGIC: Cranial nerves II through XII are intact. No focal motor or sensory defecits b/l.  PSYCHIATRIC: The patient is alert and oriented x 3.   SKIN: No obvious rash, lesion, or ulcer.   DATA REVIEW:   CBC  Recent Labs Lab 12/18/16 0455  WBC 8.5  HGB 11.7*  HCT 34.0*  PLT 166    Chemistries   Recent Labs Lab 12/15/16 1233  NA 138  K 4.0  CL 106  CO2 24  GLUCOSE 61*  BUN 19  CREATININE 1.19  CALCIUM 9.2    Cardiac Enzymes No results for input(s): TROPONINI in the last 168 hours.   RADIOLOGY:  No results found.    Management plans discussed with the patient, family and they are in agreement.  CODE STATUS:     Code Status Orders        Start     Ordered   12/15/16 1544  Full code  Continuous     12/15/16 1543    Code Status History    Date Active Date Inactive Code Status Order ID Comments User Context   11/19/2016  2:02 AM 11/24/2016  6:15 PM Full Code [id number]  Holley Raring, NP ED   10/02/2016 11:39 AM 10/10/2016  8:15 PM Full Code [id number]  Laverle Hobby, MD ED      TOTAL TIME TAKING CARE OF THIS PATIENT: 40 minutes.    Henreitta Leber M.D on 12/18/2016 at 2:04 PM  Between 7am to 6pm - Pager - [phone]  After 6pm go to [email or web address] - [facility]  Office  [phone]  CC: Primary care physician; Idelle Crouch, MD

## 2016-12-19 DIAGNOSIS — K219 Gastro-esophageal reflux disease without esophagitis: Secondary | ICD-10-CM | POA: Diagnosis not present

## 2016-12-19 DIAGNOSIS — Z452 Encounter for adjustment and management of vascular access device: Secondary | ICD-10-CM | POA: Diagnosis not present

## 2016-12-19 DIAGNOSIS — Z7982 Long term (current) use of aspirin: Secondary | ICD-10-CM | POA: Diagnosis not present

## 2016-12-19 DIAGNOSIS — T826XXA Infection and inflammatory reaction due to cardiac valve prosthesis, initial encounter: Secondary | ICD-10-CM | POA: Diagnosis not present

## 2016-12-19 DIAGNOSIS — Z5181 Encounter for therapeutic drug level monitoring: Secondary | ICD-10-CM | POA: Diagnosis not present

## 2016-12-19 DIAGNOSIS — A4101 Sepsis due to Methicillin susceptible Staphylococcus aureus: Secondary | ICD-10-CM | POA: Diagnosis not present

## 2016-12-19 DIAGNOSIS — I330 Acute and subacute infective endocarditis: Secondary | ICD-10-CM | POA: Diagnosis not present

## 2016-12-19 DIAGNOSIS — Z792 Long term (current) use of antibiotics: Secondary | ICD-10-CM | POA: Diagnosis not present

## 2016-12-21 DIAGNOSIS — I330 Acute and subacute infective endocarditis: Secondary | ICD-10-CM | POA: Diagnosis not present

## 2016-12-21 DIAGNOSIS — Z7982 Long term (current) use of aspirin: Secondary | ICD-10-CM | POA: Diagnosis not present

## 2016-12-21 DIAGNOSIS — A4101 Sepsis due to Methicillin susceptible Staphylococcus aureus: Secondary | ICD-10-CM | POA: Diagnosis not present

## 2016-12-21 DIAGNOSIS — Z5181 Encounter for therapeutic drug level monitoring: Secondary | ICD-10-CM | POA: Diagnosis not present

## 2016-12-21 DIAGNOSIS — Z792 Long term (current) use of antibiotics: Secondary | ICD-10-CM | POA: Diagnosis not present

## 2016-12-21 DIAGNOSIS — K219 Gastro-esophageal reflux disease without esophagitis: Secondary | ICD-10-CM | POA: Diagnosis not present

## 2016-12-21 DIAGNOSIS — Z452 Encounter for adjustment and management of vascular access device: Secondary | ICD-10-CM | POA: Diagnosis not present

## 2016-12-21 DIAGNOSIS — T826XXA Infection and inflammatory reaction due to cardiac valve prosthesis, initial encounter: Secondary | ICD-10-CM | POA: Diagnosis not present

## 2016-12-22 DIAGNOSIS — E785 Hyperlipidemia, unspecified: Secondary | ICD-10-CM | POA: Diagnosis not present

## 2016-12-22 DIAGNOSIS — I6310 Cerebral infarction due to embolism of unspecified precerebral artery: Secondary | ICD-10-CM | POA: Diagnosis not present

## 2016-12-22 DIAGNOSIS — G894 Chronic pain syndrome: Secondary | ICD-10-CM | POA: Diagnosis not present

## 2016-12-22 DIAGNOSIS — R339 Retention of urine, unspecified: Secondary | ICD-10-CM | POA: Diagnosis not present

## 2016-12-22 DIAGNOSIS — K219 Gastro-esophageal reflux disease without esophagitis: Secondary | ICD-10-CM | POA: Diagnosis not present

## 2016-12-22 DIAGNOSIS — Z792 Long term (current) use of antibiotics: Secondary | ICD-10-CM | POA: Diagnosis not present

## 2016-12-22 DIAGNOSIS — Z452 Encounter for adjustment and management of vascular access device: Secondary | ICD-10-CM | POA: Diagnosis not present

## 2016-12-22 DIAGNOSIS — I4891 Unspecified atrial fibrillation: Secondary | ICD-10-CM | POA: Diagnosis not present

## 2016-12-22 DIAGNOSIS — Z952 Presence of prosthetic heart valve: Secondary | ICD-10-CM | POA: Diagnosis not present

## 2016-12-22 DIAGNOSIS — Z7984 Long term (current) use of oral hypoglycemic drugs: Secondary | ICD-10-CM | POA: Diagnosis not present

## 2016-12-22 DIAGNOSIS — Z5181 Encounter for therapeutic drug level monitoring: Secondary | ICD-10-CM | POA: Diagnosis not present

## 2016-12-22 DIAGNOSIS — I330 Acute and subacute infective endocarditis: Secondary | ICD-10-CM | POA: Diagnosis not present

## 2016-12-22 DIAGNOSIS — I2510 Atherosclerotic heart disease of native coronary artery without angina pectoris: Secondary | ICD-10-CM | POA: Diagnosis not present

## 2016-12-22 DIAGNOSIS — J189 Pneumonia, unspecified organism: Secondary | ICD-10-CM | POA: Diagnosis not present

## 2016-12-22 DIAGNOSIS — E1140 Type 2 diabetes mellitus with diabetic neuropathy, unspecified: Secondary | ICD-10-CM | POA: Diagnosis not present

## 2016-12-22 DIAGNOSIS — Z7982 Long term (current) use of aspirin: Secondary | ICD-10-CM | POA: Diagnosis not present

## 2016-12-22 DIAGNOSIS — Z7901 Long term (current) use of anticoagulants: Secondary | ICD-10-CM | POA: Diagnosis not present

## 2016-12-22 DIAGNOSIS — A4101 Sepsis due to Methicillin susceptible Staphylococcus aureus: Secondary | ICD-10-CM | POA: Diagnosis not present

## 2016-12-24 DIAGNOSIS — I330 Acute and subacute infective endocarditis: Secondary | ICD-10-CM | POA: Diagnosis not present

## 2016-12-24 DIAGNOSIS — Z5181 Encounter for therapeutic drug level monitoring: Secondary | ICD-10-CM | POA: Diagnosis not present

## 2016-12-24 DIAGNOSIS — A4101 Sepsis due to Methicillin susceptible Staphylococcus aureus: Secondary | ICD-10-CM | POA: Diagnosis not present

## 2016-12-24 DIAGNOSIS — Z792 Long term (current) use of antibiotics: Secondary | ICD-10-CM | POA: Diagnosis not present

## 2016-12-24 DIAGNOSIS — Z7982 Long term (current) use of aspirin: Secondary | ICD-10-CM | POA: Diagnosis not present

## 2016-12-24 DIAGNOSIS — T826XXA Infection and inflammatory reaction due to cardiac valve prosthesis, initial encounter: Secondary | ICD-10-CM | POA: Diagnosis not present

## 2016-12-24 DIAGNOSIS — Z452 Encounter for adjustment and management of vascular access device: Secondary | ICD-10-CM | POA: Diagnosis not present

## 2016-12-24 DIAGNOSIS — K219 Gastro-esophageal reflux disease without esophagitis: Secondary | ICD-10-CM | POA: Diagnosis not present

## 2016-12-28 DIAGNOSIS — Z452 Encounter for adjustment and management of vascular access device: Secondary | ICD-10-CM | POA: Diagnosis not present

## 2016-12-28 DIAGNOSIS — T826XXA Infection and inflammatory reaction due to cardiac valve prosthesis, initial encounter: Secondary | ICD-10-CM | POA: Diagnosis not present

## 2016-12-28 DIAGNOSIS — Z7982 Long term (current) use of aspirin: Secondary | ICD-10-CM | POA: Diagnosis not present

## 2016-12-28 DIAGNOSIS — I330 Acute and subacute infective endocarditis: Secondary | ICD-10-CM | POA: Diagnosis not present

## 2016-12-28 DIAGNOSIS — Z5181 Encounter for therapeutic drug level monitoring: Secondary | ICD-10-CM | POA: Diagnosis not present

## 2016-12-28 DIAGNOSIS — K219 Gastro-esophageal reflux disease without esophagitis: Secondary | ICD-10-CM | POA: Diagnosis not present

## 2016-12-28 DIAGNOSIS — Z792 Long term (current) use of antibiotics: Secondary | ICD-10-CM | POA: Diagnosis not present

## 2016-12-28 DIAGNOSIS — A4101 Sepsis due to Methicillin susceptible Staphylococcus aureus: Secondary | ICD-10-CM | POA: Diagnosis not present

## 2016-12-29 DIAGNOSIS — T826XXA Infection and inflammatory reaction due to cardiac valve prosthesis, initial encounter: Secondary | ICD-10-CM | POA: Diagnosis not present

## 2016-12-29 DIAGNOSIS — Z5181 Encounter for therapeutic drug level monitoring: Secondary | ICD-10-CM | POA: Diagnosis not present

## 2016-12-29 DIAGNOSIS — A4101 Sepsis due to Methicillin susceptible Staphylococcus aureus: Secondary | ICD-10-CM | POA: Diagnosis not present

## 2016-12-29 DIAGNOSIS — Z7982 Long term (current) use of aspirin: Secondary | ICD-10-CM | POA: Diagnosis not present

## 2016-12-29 DIAGNOSIS — Z452 Encounter for adjustment and management of vascular access device: Secondary | ICD-10-CM | POA: Diagnosis not present

## 2016-12-29 DIAGNOSIS — Z792 Long term (current) use of antibiotics: Secondary | ICD-10-CM | POA: Diagnosis not present

## 2016-12-29 DIAGNOSIS — I330 Acute and subacute infective endocarditis: Secondary | ICD-10-CM | POA: Diagnosis not present

## 2016-12-29 DIAGNOSIS — K219 Gastro-esophageal reflux disease without esophagitis: Secondary | ICD-10-CM | POA: Diagnosis not present

## 2016-12-30 DIAGNOSIS — I6310 Cerebral infarction due to embolism of unspecified precerebral artery: Secondary | ICD-10-CM | POA: Diagnosis not present

## 2016-12-30 DIAGNOSIS — T826XXD Infection and inflammatory reaction due to cardiac valve prosthesis, subsequent encounter: Secondary | ICD-10-CM | POA: Diagnosis not present

## 2016-12-31 DIAGNOSIS — I2510 Atherosclerotic heart disease of native coronary artery without angina pectoris: Secondary | ICD-10-CM | POA: Diagnosis not present

## 2016-12-31 DIAGNOSIS — K219 Gastro-esophageal reflux disease without esophagitis: Secondary | ICD-10-CM | POA: Diagnosis not present

## 2016-12-31 DIAGNOSIS — I4891 Unspecified atrial fibrillation: Secondary | ICD-10-CM | POA: Diagnosis not present

## 2016-12-31 DIAGNOSIS — Z452 Encounter for adjustment and management of vascular access device: Secondary | ICD-10-CM | POA: Diagnosis not present

## 2016-12-31 DIAGNOSIS — I330 Acute and subacute infective endocarditis: Secondary | ICD-10-CM | POA: Diagnosis not present

## 2016-12-31 DIAGNOSIS — Z7901 Long term (current) use of anticoagulants: Secondary | ICD-10-CM | POA: Diagnosis not present

## 2016-12-31 DIAGNOSIS — E1140 Type 2 diabetes mellitus with diabetic neuropathy, unspecified: Secondary | ICD-10-CM | POA: Diagnosis not present

## 2016-12-31 DIAGNOSIS — Z7984 Long term (current) use of oral hypoglycemic drugs: Secondary | ICD-10-CM | POA: Diagnosis not present

## 2016-12-31 DIAGNOSIS — G894 Chronic pain syndrome: Secondary | ICD-10-CM | POA: Diagnosis not present

## 2016-12-31 DIAGNOSIS — A4101 Sepsis due to Methicillin susceptible Staphylococcus aureus: Secondary | ICD-10-CM | POA: Diagnosis not present

## 2016-12-31 DIAGNOSIS — Z5181 Encounter for therapeutic drug level monitoring: Secondary | ICD-10-CM | POA: Diagnosis not present

## 2016-12-31 DIAGNOSIS — Z792 Long term (current) use of antibiotics: Secondary | ICD-10-CM | POA: Diagnosis not present

## 2016-12-31 DIAGNOSIS — R339 Retention of urine, unspecified: Secondary | ICD-10-CM | POA: Diagnosis not present

## 2016-12-31 DIAGNOSIS — Z7982 Long term (current) use of aspirin: Secondary | ICD-10-CM | POA: Diagnosis not present

## 2016-12-31 DIAGNOSIS — E785 Hyperlipidemia, unspecified: Secondary | ICD-10-CM | POA: Diagnosis not present

## 2017-01-02 DIAGNOSIS — K219 Gastro-esophageal reflux disease without esophagitis: Secondary | ICD-10-CM | POA: Diagnosis not present

## 2017-01-02 DIAGNOSIS — Z452 Encounter for adjustment and management of vascular access device: Secondary | ICD-10-CM | POA: Diagnosis not present

## 2017-01-02 DIAGNOSIS — A4101 Sepsis due to Methicillin susceptible Staphylococcus aureus: Secondary | ICD-10-CM | POA: Diagnosis not present

## 2017-01-02 DIAGNOSIS — Z5181 Encounter for therapeutic drug level monitoring: Secondary | ICD-10-CM | POA: Diagnosis not present

## 2017-01-02 DIAGNOSIS — T826XXA Infection and inflammatory reaction due to cardiac valve prosthesis, initial encounter: Secondary | ICD-10-CM | POA: Diagnosis not present

## 2017-01-02 DIAGNOSIS — Z792 Long term (current) use of antibiotics: Secondary | ICD-10-CM | POA: Diagnosis not present

## 2017-01-02 DIAGNOSIS — I330 Acute and subacute infective endocarditis: Secondary | ICD-10-CM | POA: Diagnosis not present

## 2017-01-02 DIAGNOSIS — Z7982 Long term (current) use of aspirin: Secondary | ICD-10-CM | POA: Diagnosis not present

## 2017-01-05 DIAGNOSIS — Z7901 Long term (current) use of anticoagulants: Secondary | ICD-10-CM | POA: Diagnosis not present

## 2017-01-05 DIAGNOSIS — Z7984 Long term (current) use of oral hypoglycemic drugs: Secondary | ICD-10-CM | POA: Diagnosis not present

## 2017-01-05 DIAGNOSIS — Z7982 Long term (current) use of aspirin: Secondary | ICD-10-CM | POA: Diagnosis not present

## 2017-01-05 DIAGNOSIS — I4891 Unspecified atrial fibrillation: Secondary | ICD-10-CM | POA: Diagnosis not present

## 2017-01-05 DIAGNOSIS — E785 Hyperlipidemia, unspecified: Secondary | ICD-10-CM | POA: Diagnosis not present

## 2017-01-05 DIAGNOSIS — I330 Acute and subacute infective endocarditis: Secondary | ICD-10-CM | POA: Diagnosis not present

## 2017-01-05 DIAGNOSIS — A4101 Sepsis due to Methicillin susceptible Staphylococcus aureus: Secondary | ICD-10-CM | POA: Diagnosis not present

## 2017-01-05 DIAGNOSIS — Z5181 Encounter for therapeutic drug level monitoring: Secondary | ICD-10-CM | POA: Diagnosis not present

## 2017-01-05 DIAGNOSIS — I2510 Atherosclerotic heart disease of native coronary artery without angina pectoris: Secondary | ICD-10-CM | POA: Diagnosis not present

## 2017-01-05 DIAGNOSIS — G894 Chronic pain syndrome: Secondary | ICD-10-CM | POA: Diagnosis not present

## 2017-01-05 DIAGNOSIS — Z452 Encounter for adjustment and management of vascular access device: Secondary | ICD-10-CM | POA: Diagnosis not present

## 2017-01-05 DIAGNOSIS — K219 Gastro-esophageal reflux disease without esophagitis: Secondary | ICD-10-CM | POA: Diagnosis not present

## 2017-01-05 DIAGNOSIS — Z792 Long term (current) use of antibiotics: Secondary | ICD-10-CM | POA: Diagnosis not present

## 2017-01-05 DIAGNOSIS — R339 Retention of urine, unspecified: Secondary | ICD-10-CM | POA: Diagnosis not present

## 2017-01-05 DIAGNOSIS — E1140 Type 2 diabetes mellitus with diabetic neuropathy, unspecified: Secondary | ICD-10-CM | POA: Diagnosis not present

## 2017-01-08 DIAGNOSIS — I6310 Cerebral infarction due to embolism of unspecified precerebral artery: Secondary | ICD-10-CM | POA: Diagnosis not present

## 2017-01-08 DIAGNOSIS — E782 Mixed hyperlipidemia: Secondary | ICD-10-CM | POA: Diagnosis not present

## 2017-01-08 DIAGNOSIS — Z0000 Encounter for general adult medical examination without abnormal findings: Secondary | ICD-10-CM | POA: Diagnosis not present

## 2017-01-08 DIAGNOSIS — I358 Other nonrheumatic aortic valve disorders: Secondary | ICD-10-CM | POA: Insufficient documentation

## 2017-01-08 DIAGNOSIS — I10 Essential (primary) hypertension: Secondary | ICD-10-CM | POA: Diagnosis not present

## 2017-01-08 DIAGNOSIS — R0602 Shortness of breath: Secondary | ICD-10-CM | POA: Diagnosis not present

## 2017-01-08 DIAGNOSIS — T826XXD Infection and inflammatory reaction due to cardiac valve prosthesis, subsequent encounter: Secondary | ICD-10-CM | POA: Diagnosis not present

## 2017-01-08 DIAGNOSIS — I4891 Unspecified atrial fibrillation: Secondary | ICD-10-CM | POA: Diagnosis not present

## 2017-01-08 DIAGNOSIS — I2510 Atherosclerotic heart disease of native coronary artery without angina pectoris: Secondary | ICD-10-CM | POA: Diagnosis not present

## 2017-01-08 DIAGNOSIS — E1140 Type 2 diabetes mellitus with diabetic neuropathy, unspecified: Secondary | ICD-10-CM | POA: Diagnosis not present

## 2017-01-08 DIAGNOSIS — I481 Persistent atrial fibrillation: Secondary | ICD-10-CM | POA: Diagnosis not present

## 2017-01-11 DIAGNOSIS — M545 Low back pain: Secondary | ICD-10-CM | POA: Diagnosis not present

## 2017-01-11 DIAGNOSIS — I4891 Unspecified atrial fibrillation: Secondary | ICD-10-CM | POA: Diagnosis not present

## 2017-01-11 DIAGNOSIS — Z7901 Long term (current) use of anticoagulants: Secondary | ICD-10-CM | POA: Diagnosis not present

## 2017-01-11 DIAGNOSIS — Z7984 Long term (current) use of oral hypoglycemic drugs: Secondary | ICD-10-CM | POA: Diagnosis not present

## 2017-01-11 DIAGNOSIS — E785 Hyperlipidemia, unspecified: Secondary | ICD-10-CM | POA: Diagnosis not present

## 2017-01-11 DIAGNOSIS — K219 Gastro-esophageal reflux disease without esophagitis: Secondary | ICD-10-CM | POA: Diagnosis not present

## 2017-01-11 DIAGNOSIS — M47896 Other spondylosis, lumbar region: Secondary | ICD-10-CM | POA: Diagnosis not present

## 2017-01-11 DIAGNOSIS — Z452 Encounter for adjustment and management of vascular access device: Secondary | ICD-10-CM | POA: Diagnosis not present

## 2017-01-11 DIAGNOSIS — Z79891 Long term (current) use of opiate analgesic: Secondary | ICD-10-CM | POA: Diagnosis not present

## 2017-01-11 DIAGNOSIS — R339 Retention of urine, unspecified: Secondary | ICD-10-CM | POA: Diagnosis not present

## 2017-01-11 DIAGNOSIS — I330 Acute and subacute infective endocarditis: Secondary | ICD-10-CM | POA: Diagnosis not present

## 2017-01-11 DIAGNOSIS — Z5181 Encounter for therapeutic drug level monitoring: Secondary | ICD-10-CM | POA: Diagnosis not present

## 2017-01-11 DIAGNOSIS — I2510 Atherosclerotic heart disease of native coronary artery without angina pectoris: Secondary | ICD-10-CM | POA: Diagnosis not present

## 2017-01-11 DIAGNOSIS — Z79899 Other long term (current) drug therapy: Secondary | ICD-10-CM | POA: Diagnosis not present

## 2017-01-11 DIAGNOSIS — Z7982 Long term (current) use of aspirin: Secondary | ICD-10-CM | POA: Diagnosis not present

## 2017-01-11 DIAGNOSIS — G894 Chronic pain syndrome: Secondary | ICD-10-CM | POA: Diagnosis not present

## 2017-01-11 DIAGNOSIS — E1140 Type 2 diabetes mellitus with diabetic neuropathy, unspecified: Secondary | ICD-10-CM | POA: Diagnosis not present

## 2017-01-11 DIAGNOSIS — A4101 Sepsis due to Methicillin susceptible Staphylococcus aureus: Secondary | ICD-10-CM | POA: Diagnosis not present

## 2017-01-11 DIAGNOSIS — Z792 Long term (current) use of antibiotics: Secondary | ICD-10-CM | POA: Diagnosis not present

## 2017-01-18 DIAGNOSIS — Z7901 Long term (current) use of anticoagulants: Secondary | ICD-10-CM | POA: Diagnosis not present

## 2017-01-22 DIAGNOSIS — I358 Other nonrheumatic aortic valve disorders: Secondary | ICD-10-CM | POA: Diagnosis not present

## 2017-01-22 DIAGNOSIS — Z5181 Encounter for therapeutic drug level monitoring: Secondary | ICD-10-CM | POA: Diagnosis not present

## 2017-01-22 DIAGNOSIS — T826XXD Infection and inflammatory reaction due to cardiac valve prosthesis, subsequent encounter: Secondary | ICD-10-CM | POA: Diagnosis not present

## 2017-01-22 DIAGNOSIS — Z7901 Long term (current) use of anticoagulants: Secondary | ICD-10-CM | POA: Diagnosis not present

## 2017-01-25 DIAGNOSIS — Z452 Encounter for adjustment and management of vascular access device: Secondary | ICD-10-CM | POA: Diagnosis not present

## 2017-01-25 DIAGNOSIS — I482 Chronic atrial fibrillation: Secondary | ICD-10-CM | POA: Diagnosis not present

## 2017-01-25 DIAGNOSIS — A4101 Sepsis due to Methicillin susceptible Staphylococcus aureus: Secondary | ICD-10-CM | POA: Diagnosis not present

## 2017-01-25 DIAGNOSIS — R339 Retention of urine, unspecified: Secondary | ICD-10-CM | POA: Diagnosis not present

## 2017-01-25 DIAGNOSIS — I2510 Atherosclerotic heart disease of native coronary artery without angina pectoris: Secondary | ICD-10-CM | POA: Diagnosis not present

## 2017-01-27 DIAGNOSIS — Z7901 Long term (current) use of anticoagulants: Secondary | ICD-10-CM | POA: Diagnosis not present

## 2017-01-27 DIAGNOSIS — Z5181 Encounter for therapeutic drug level monitoring: Secondary | ICD-10-CM | POA: Diagnosis not present

## 2017-02-03 DIAGNOSIS — Z5181 Encounter for therapeutic drug level monitoring: Secondary | ICD-10-CM | POA: Diagnosis not present

## 2017-02-03 DIAGNOSIS — Z7901 Long term (current) use of anticoagulants: Secondary | ICD-10-CM | POA: Diagnosis not present

## 2017-02-05 DIAGNOSIS — I481 Persistent atrial fibrillation: Secondary | ICD-10-CM | POA: Diagnosis not present

## 2017-02-05 DIAGNOSIS — I480 Paroxysmal atrial fibrillation: Secondary | ICD-10-CM | POA: Diagnosis not present

## 2017-02-05 DIAGNOSIS — T826XXD Infection and inflammatory reaction due to cardiac valve prosthesis, subsequent encounter: Secondary | ICD-10-CM | POA: Diagnosis not present

## 2017-02-05 DIAGNOSIS — I350 Nonrheumatic aortic (valve) stenosis: Secondary | ICD-10-CM | POA: Diagnosis not present

## 2017-02-08 DIAGNOSIS — G894 Chronic pain syndrome: Secondary | ICD-10-CM | POA: Diagnosis not present

## 2017-02-08 DIAGNOSIS — M47896 Other spondylosis, lumbar region: Secondary | ICD-10-CM | POA: Diagnosis not present

## 2017-02-08 DIAGNOSIS — Z7901 Long term (current) use of anticoagulants: Secondary | ICD-10-CM | POA: Diagnosis not present

## 2017-02-08 DIAGNOSIS — M5136 Other intervertebral disc degeneration, lumbar region: Secondary | ICD-10-CM | POA: Diagnosis not present

## 2017-02-17 DIAGNOSIS — Z5181 Encounter for therapeutic drug level monitoring: Secondary | ICD-10-CM | POA: Diagnosis not present

## 2017-02-17 DIAGNOSIS — Z7901 Long term (current) use of anticoagulants: Secondary | ICD-10-CM | POA: Diagnosis not present

## 2017-02-19 DIAGNOSIS — I6310 Cerebral infarction due to embolism of unspecified precerebral artery: Secondary | ICD-10-CM | POA: Diagnosis not present

## 2017-02-19 DIAGNOSIS — I358 Other nonrheumatic aortic valve disorders: Secondary | ICD-10-CM | POA: Diagnosis not present

## 2017-02-19 DIAGNOSIS — T826XXD Infection and inflammatory reaction due to cardiac valve prosthesis, subsequent encounter: Secondary | ICD-10-CM | POA: Diagnosis not present

## 2017-02-23 DIAGNOSIS — T826XXD Infection and inflammatory reaction due to cardiac valve prosthesis, subsequent encounter: Secondary | ICD-10-CM | POA: Diagnosis not present

## 2017-02-23 DIAGNOSIS — I358 Other nonrheumatic aortic valve disorders: Secondary | ICD-10-CM | POA: Diagnosis not present

## 2017-03-03 DIAGNOSIS — I058 Other rheumatic mitral valve diseases: Secondary | ICD-10-CM | POA: Diagnosis not present

## 2017-03-03 DIAGNOSIS — Z952 Presence of prosthetic heart valve: Secondary | ICD-10-CM | POA: Diagnosis not present

## 2017-03-08 DIAGNOSIS — R0602 Shortness of breath: Secondary | ICD-10-CM | POA: Diagnosis not present

## 2017-03-08 DIAGNOSIS — I481 Persistent atrial fibrillation: Secondary | ICD-10-CM | POA: Diagnosis not present

## 2017-03-08 DIAGNOSIS — I350 Nonrheumatic aortic (valve) stenosis: Secondary | ICD-10-CM | POA: Diagnosis not present

## 2017-03-08 DIAGNOSIS — I2510 Atherosclerotic heart disease of native coronary artery without angina pectoris: Secondary | ICD-10-CM | POA: Diagnosis not present

## 2017-03-09 DIAGNOSIS — M5136 Other intervertebral disc degeneration, lumbar region: Secondary | ICD-10-CM | POA: Diagnosis not present

## 2017-03-09 DIAGNOSIS — M545 Low back pain: Secondary | ICD-10-CM | POA: Diagnosis not present

## 2017-03-09 DIAGNOSIS — G894 Chronic pain syndrome: Secondary | ICD-10-CM | POA: Diagnosis not present

## 2017-03-17 DIAGNOSIS — Z7901 Long term (current) use of anticoagulants: Secondary | ICD-10-CM | POA: Diagnosis not present

## 2017-03-17 DIAGNOSIS — Z5181 Encounter for therapeutic drug level monitoring: Secondary | ICD-10-CM | POA: Diagnosis not present

## 2017-03-23 DIAGNOSIS — S40012A Contusion of left shoulder, initial encounter: Secondary | ICD-10-CM | POA: Insufficient documentation

## 2017-03-23 DIAGNOSIS — M25552 Pain in left hip: Secondary | ICD-10-CM | POA: Diagnosis not present

## 2017-03-24 DIAGNOSIS — Z5181 Encounter for therapeutic drug level monitoring: Secondary | ICD-10-CM | POA: Diagnosis not present

## 2017-03-24 DIAGNOSIS — Z7901 Long term (current) use of anticoagulants: Secondary | ICD-10-CM | POA: Diagnosis not present

## 2017-03-31 DIAGNOSIS — Z5181 Encounter for therapeutic drug level monitoring: Secondary | ICD-10-CM | POA: Diagnosis not present

## 2017-03-31 DIAGNOSIS — Z7901 Long term (current) use of anticoagulants: Secondary | ICD-10-CM | POA: Diagnosis not present

## 2017-04-06 DIAGNOSIS — Z6841 Body Mass Index (BMI) 40.0 and over, adult: Secondary | ICD-10-CM | POA: Diagnosis not present

## 2017-04-06 DIAGNOSIS — G894 Chronic pain syndrome: Secondary | ICD-10-CM | POA: Diagnosis not present

## 2017-04-08 DIAGNOSIS — I481 Persistent atrial fibrillation: Secondary | ICD-10-CM | POA: Diagnosis not present

## 2017-04-08 DIAGNOSIS — I10 Essential (primary) hypertension: Secondary | ICD-10-CM | POA: Diagnosis not present

## 2017-04-08 DIAGNOSIS — R55 Syncope and collapse: Secondary | ICD-10-CM | POA: Diagnosis not present

## 2017-04-08 DIAGNOSIS — R42 Dizziness and giddiness: Secondary | ICD-10-CM | POA: Diagnosis not present

## 2017-04-09 ENCOUNTER — Other Ambulatory Visit: Payer: Self-pay | Admitting: Internal Medicine

## 2017-04-09 DIAGNOSIS — R42 Dizziness and giddiness: Secondary | ICD-10-CM

## 2017-04-09 DIAGNOSIS — R55 Syncope and collapse: Secondary | ICD-10-CM

## 2017-04-15 DIAGNOSIS — Z5181 Encounter for therapeutic drug level monitoring: Secondary | ICD-10-CM | POA: Diagnosis not present

## 2017-04-15 DIAGNOSIS — I358 Other nonrheumatic aortic valve disorders: Secondary | ICD-10-CM | POA: Diagnosis not present

## 2017-04-15 DIAGNOSIS — I6310 Cerebral infarction due to embolism of unspecified precerebral artery: Secondary | ICD-10-CM | POA: Diagnosis not present

## 2017-04-15 DIAGNOSIS — Z7901 Long term (current) use of anticoagulants: Secondary | ICD-10-CM | POA: Diagnosis not present

## 2017-04-15 DIAGNOSIS — I700 Atherosclerosis of aorta: Secondary | ICD-10-CM | POA: Diagnosis not present

## 2017-04-15 DIAGNOSIS — I10 Essential (primary) hypertension: Secondary | ICD-10-CM | POA: Diagnosis not present

## 2017-04-15 DIAGNOSIS — E1140 Type 2 diabetes mellitus with diabetic neuropathy, unspecified: Secondary | ICD-10-CM | POA: Diagnosis not present

## 2017-04-15 DIAGNOSIS — I481 Persistent atrial fibrillation: Secondary | ICD-10-CM | POA: Diagnosis not present

## 2017-04-15 DIAGNOSIS — R42 Dizziness and giddiness: Secondary | ICD-10-CM | POA: Diagnosis not present

## 2017-04-15 DIAGNOSIS — I350 Nonrheumatic aortic (valve) stenosis: Secondary | ICD-10-CM | POA: Diagnosis not present

## 2017-04-15 DIAGNOSIS — R55 Syncope and collapse: Secondary | ICD-10-CM | POA: Diagnosis not present

## 2017-04-15 DIAGNOSIS — E782 Mixed hyperlipidemia: Secondary | ICD-10-CM | POA: Diagnosis not present

## 2017-04-15 DIAGNOSIS — I272 Pulmonary hypertension, unspecified: Secondary | ICD-10-CM | POA: Diagnosis not present

## 2017-04-15 DIAGNOSIS — I2510 Atherosclerotic heart disease of native coronary artery without angina pectoris: Secondary | ICD-10-CM | POA: Diagnosis not present

## 2017-04-16 ENCOUNTER — Ambulatory Visit
Admission: RE | Admit: 2017-04-16 | Discharge: 2017-04-16 | Disposition: A | Discharge status: Home / Self Care | Payer: PPO | Source: Ambulatory Visit | Attending: Internal Medicine | Admitting: Internal Medicine

## 2017-04-16 DIAGNOSIS — R42 Dizziness and giddiness: Secondary | ICD-10-CM

## 2017-04-16 DIAGNOSIS — R55 Syncope and collapse: Secondary | ICD-10-CM | POA: Diagnosis not present

## 2017-04-16 DIAGNOSIS — M7071 Other bursitis of hip, right hip: Secondary | ICD-10-CM | POA: Diagnosis not present

## 2017-04-16 DIAGNOSIS — M7072 Other bursitis of hip, left hip: Secondary | ICD-10-CM | POA: Diagnosis not present

## 2017-04-26 DIAGNOSIS — Z125 Encounter for screening for malignant neoplasm of prostate: Secondary | ICD-10-CM | POA: Diagnosis not present

## 2017-04-26 DIAGNOSIS — R270 Ataxia, unspecified: Secondary | ICD-10-CM | POA: Diagnosis not present

## 2017-04-26 DIAGNOSIS — Z79899 Other long term (current) drug therapy: Secondary | ICD-10-CM | POA: Diagnosis not present

## 2017-04-26 DIAGNOSIS — R791 Abnormal coagulation profile: Secondary | ICD-10-CM | POA: Diagnosis not present

## 2017-04-26 DIAGNOSIS — I10 Essential (primary) hypertension: Secondary | ICD-10-CM | POA: Diagnosis not present

## 2017-04-26 DIAGNOSIS — R55 Syncope and collapse: Secondary | ICD-10-CM | POA: Diagnosis not present

## 2017-04-26 DIAGNOSIS — E782 Mixed hyperlipidemia: Secondary | ICD-10-CM | POA: Diagnosis not present

## 2017-04-26 DIAGNOSIS — E1140 Type 2 diabetes mellitus with diabetic neuropathy, unspecified: Secondary | ICD-10-CM | POA: Diagnosis not present

## 2017-04-26 DIAGNOSIS — R42 Dizziness and giddiness: Secondary | ICD-10-CM | POA: Diagnosis not present

## 2017-04-28 DIAGNOSIS — I481 Persistent atrial fibrillation: Secondary | ICD-10-CM | POA: Diagnosis not present

## 2017-05-03 DIAGNOSIS — I480 Paroxysmal atrial fibrillation: Secondary | ICD-10-CM | POA: Diagnosis not present

## 2017-05-03 DIAGNOSIS — Z7901 Long term (current) use of anticoagulants: Secondary | ICD-10-CM | POA: Diagnosis not present

## 2017-05-03 DIAGNOSIS — Z5181 Encounter for therapeutic drug level monitoring: Secondary | ICD-10-CM | POA: Diagnosis not present

## 2017-05-04 ENCOUNTER — Ambulatory Visit
Admission: RE | Admit: 2017-05-04 | Discharge: 2017-05-04 | Disposition: A | Discharge status: Home / Self Care | Payer: PPO | Source: Ambulatory Visit | Attending: Internal Medicine | Admitting: Internal Medicine

## 2017-05-04 ENCOUNTER — Ambulatory Visit: Payer: PPO | Admitting: Anesthesiology

## 2017-05-04 ENCOUNTER — Encounter
Admission: RE | Disposition: A | Discharge status: Home / Self Care | Payer: Self-pay | Source: Ambulatory Visit | Attending: Internal Medicine

## 2017-05-04 DIAGNOSIS — I4891 Unspecified atrial fibrillation: Secondary | ICD-10-CM | POA: Diagnosis not present

## 2017-05-04 DIAGNOSIS — E1140 Type 2 diabetes mellitus with diabetic neuropathy, unspecified: Secondary | ICD-10-CM | POA: Insufficient documentation

## 2017-05-04 DIAGNOSIS — G4700 Insomnia, unspecified: Secondary | ICD-10-CM | POA: Insufficient documentation

## 2017-05-04 DIAGNOSIS — I10 Essential (primary) hypertension: Secondary | ICD-10-CM | POA: Insufficient documentation

## 2017-05-04 DIAGNOSIS — Z8673 Personal history of transient ischemic attack (TIA), and cerebral infarction without residual deficits: Secondary | ICD-10-CM | POA: Diagnosis not present

## 2017-05-04 DIAGNOSIS — K219 Gastro-esophageal reflux disease without esophagitis: Secondary | ICD-10-CM | POA: Insufficient documentation

## 2017-05-04 DIAGNOSIS — I2510 Atherosclerotic heart disease of native coronary artery without angina pectoris: Secondary | ICD-10-CM | POA: Diagnosis not present

## 2017-05-04 DIAGNOSIS — M1990 Unspecified osteoarthritis, unspecified site: Secondary | ICD-10-CM | POA: Diagnosis not present

## 2017-05-04 DIAGNOSIS — G8929 Other chronic pain: Secondary | ICD-10-CM | POA: Diagnosis not present

## 2017-05-04 DIAGNOSIS — E785 Hyperlipidemia, unspecified: Secondary | ICD-10-CM | POA: Insufficient documentation

## 2017-05-04 DIAGNOSIS — E119 Type 2 diabetes mellitus without complications: Secondary | ICD-10-CM | POA: Diagnosis not present

## 2017-05-04 HISTORY — PX: CARDIOVERSION: EP1203

## 2017-05-04 LAB — PROTIME-INR
INR: 2.4
Prothrombin Time: 26.6 seconds — ABNORMAL HIGH (ref 11.4–15.2)

## 2017-05-04 SURGERY — CARDIOVERSION (CATH LAB)
Anesthesia: General

## 2017-05-04 MED ORDER — SODIUM CHLORIDE 0.9 % IV SOLN
INTRAVENOUS | Status: DC
  Administered 2017-05-04: 08:00:00 via INTRAVENOUS

## 2017-05-04 MED ORDER — PROPOFOL 10 MG/ML IV BOLUS
INTRAVENOUS | Status: AC
  Filled 2017-05-04: qty 20

## 2017-05-04 MED ORDER — PROPOFOL 10 MG/ML IV BOLUS
INTRAVENOUS | Status: DC | PRN
  Administered 2017-05-04: 80 mg via INTRAVENOUS

## 2017-05-04 NOTE — Discharge Instructions (Signed)
Electrical Cardioversion, Care After °This sheet gives you information about how to care for yourself after your procedure. Your health care provider may also give you more specific instructions. If you have problems or questions, contact your health care provider. °What can I expect after the procedure? °After the procedure, it is common to have: °· Some redness on the skin where the shocks were given. ° °Follow these instructions at home: °· Do not drive for 24 hours if you were given a medicine to help you relax (sedative). °· Take over-the-counter and prescription medicines only as told by your health care provider. °· Ask your health care provider how to check your pulse. Check it often. °· Rest for 48 hours after the procedure or as told by your health care provider. °· Avoid or limit your caffeine use as told by your health care provider. °Contact a health care provider if: °· You feel like your heart is beating too quickly or your pulse is not regular. °· You have a serious muscle cramp that does not go away. °Get help right away if: °· You have discomfort in your chest. °· You are dizzy or you feel faint. °· You have trouble breathing or you are short of breath. °· Your speech is slurred. °· You have trouble moving an arm or leg on one side of your body. °· Your fingers or toes turn cold or blue. °This information is not intended to replace advice given to you by your health care provider. Make sure you discuss any questions you have with your health care provider. °Document Released: 07/26/2013 Document Revised: 05/08/2016 Document Reviewed: 04/10/2016 °Elsevier Interactive Patient Education © 2018 [employer] ° °

## 2017-05-04 NOTE — Transfer of Care (Signed)
Immediate Anesthesia Transfer of Care Note  Patient: [name]  Procedure(s) Performed: Procedure(s): CARDIOVERSION (N/A)  Patient Location: PACU and Short Stay  Anesthesia Type:General  Level of Consciousness: awake, alert  and oriented  Airway & Oxygen Therapy: Patient Spontanous Breathing and Patient connected to nasal cannula oxygen  Post-op Assessment: Report given to RN and Post -op Vital signs reviewed and stable  Post vital signs: Reviewed and stable  Last Vitals:  Vitals:   05/04/17 0844 05/04/17 0845  BP:  107/68  Pulse: (!) 57 (!) 59  Resp: 11 16  Temp:      Last Pain:  Vitals:   05/04/17 0753  TempSrc: Oral         Complications: No apparent anesthesia complications

## 2017-05-04 NOTE — Anesthesia Post-op Follow-up Note (Cosign Needed)
Anesthesia QCDR form completed.        

## 2017-05-04 NOTE — Anesthesia Preprocedure Evaluation (Signed)
Anesthesia Evaluation  Patient identified by MRN, date of birth, ID band Patient awake    Reviewed: Allergy & Precautions, H&P , NPO status , Patient's Chart, lab work & pertinent test results, reviewed documented beta blocker date and time   Airway Mallampati: II   Neck ROM: full    Dental  (+) Poor Dentition   Pulmonary neg pulmonary ROS, neg shortness of breath,    Pulmonary exam normal        Cardiovascular hypertension, (-) angina+ CAD  negative cardio ROS Normal cardiovascular examAtrial Fibrillation  Rhythm:regular Rate:Normal     Neuro/Psych TIACVA, Residual Symptoms negative neurological ROS  negative psych ROS   GI/Hepatic negative GI ROS, Neg liver ROS, GERD  Medicated,  Endo/Other  negative endocrine ROSdiabetes  Renal/GU negative Renal ROS  negative genitourinary   Musculoskeletal   Abdominal   Peds  Hematology negative hematology ROS (+)   Anesthesia Other Findings Past Medical History: No date: Chronic pain No date: Coronary artery disease No date: Diabetes mellitus without complication (HCC) No date: DJD (degenerative joint disease) No date: GERD (gastroesophageal reflux disease) No date: Hyperlipemia No date: Hypertension No date: Insomnia No date: Neuropathy [facility]) Past Surgical History: No date: BACK SURGERY No date: CARDIAC SURGERY No date: JOINT REPLACEMENT 10/08/2016: TEE WITHOUT CARDIOVERSION; N/A     Comment:  Procedure: TRANSESOPHAGEAL ECHOCARDIOGRAM (TEE);                Surgeon: Teodoro Spray, MD;  Location: [facility] ORS;                Service: Cardiovascular;  Laterality: N/A; 11/23/2016: TEE WITHOUT CARDIOVERSION; N/A     Comment:  Procedure: TRANSESOPHAGEAL ECHOCARDIOGRAM (TEE);                Surgeon: Isaias Cowman, MD;  Location: [facility] ORS;                Service: Cardiovascular;  Laterality: N/A; BMI    Body Mass Index:  37.59 kg/m      Reproductive/Obstetrics negative OB ROS                             Anesthesia Physical Anesthesia Plan  ASA: III  Anesthesia Plan: General   Post-op Pain Management:    Induction:   PONV Risk Score and Plan: 1 and 2 and Ondansetron and Dexamethasone  Airway Management Planned:   Additional Equipment:   Intra-op Plan:   Post-operative Plan:   Informed Consent: I have reviewed the patients History and Physical, chart, labs and discussed the procedure including the risks, benefits and alternatives for the proposed anesthesia with the patient or authorized representative who has indicated his/her understanding and acceptance.   Dental Advisory Given  Plan Discussed with: CRNA  Anesthesia Plan Comments:         Anesthesia Quick Evaluation

## 2017-05-04 NOTE — CV Procedure (Signed)
Electrical Cardioversion Procedure Note LONGINO TREFZ [id number] 1946/01/07  Procedure: Electrical Cardioversion Indications:  Atrial Fibrillation  Procedure Details Consent: Risks of procedure as well as the alternatives and risks of each were explained to the (patient/caregiver).  Consent for procedure obtained. Time Out: Verified patient identification, verified procedure, site/side was marked, verified correct patient position, special equipment/implants available, medications/allergies/relevent history reviewed, required imaging and test results available.  Performed  Patient placed on cardiac monitor, pulse oximetry, supplemental oxygen as necessary.  Sedation given: Benzodiazepines and Short-acting barbiturates Pacer pads placed anterior and posterior chest.  Cardioverted 1 time(s).  Cardioverted at 120J.  Evaluation Findings: Post procedure EKG shows: NSR Complications: None Patient did tolerate procedure well.   Corey Skains 05/04/2017, 8:45 AM

## 2017-05-05 ENCOUNTER — Encounter: Payer: Self-pay | Admitting: Internal Medicine

## 2017-05-05 NOTE — Anesthesia Postprocedure Evaluation (Signed)
Anesthesia Post Note  Patient: [name]  Procedure(s) Performed: Procedure(s) (LRB): CARDIOVERSION (N/A)  Patient location during evaluation: PACU Anesthesia Type: General Level of consciousness: awake and alert Pain management: pain level controlled Vital Signs Assessment: post-procedure vital signs reviewed and stable Respiratory status: spontaneous breathing, nonlabored ventilation, respiratory function stable and patient connected to nasal cannula oxygen Cardiovascular status: blood pressure returned to baseline and stable Postop Assessment: no signs of nausea or vomiting Anesthetic complications: no     Last Vitals:  Vitals:   05/04/17 0915 05/04/17 0916  BP:  128/78  Pulse: 61 63  Resp: 14 13  Temp:      Last Pain:  Vitals:   05/04/17 0753  TempSrc: Oral                 Molli Barrows

## 2017-05-07 DIAGNOSIS — I6523 Occlusion and stenosis of bilateral carotid arteries: Secondary | ICD-10-CM | POA: Diagnosis not present

## 2017-05-07 DIAGNOSIS — R55 Syncope and collapse: Secondary | ICD-10-CM | POA: Diagnosis not present

## 2017-05-10 DIAGNOSIS — Z5181 Encounter for therapeutic drug level monitoring: Secondary | ICD-10-CM | POA: Diagnosis not present

## 2017-05-10 DIAGNOSIS — Z7901 Long term (current) use of anticoagulants: Secondary | ICD-10-CM | POA: Diagnosis not present

## 2017-05-14 DIAGNOSIS — I2510 Atherosclerotic heart disease of native coronary artery without angina pectoris: Secondary | ICD-10-CM | POA: Diagnosis not present

## 2017-05-14 DIAGNOSIS — I6523 Occlusion and stenosis of bilateral carotid arteries: Secondary | ICD-10-CM | POA: Diagnosis not present

## 2017-05-14 DIAGNOSIS — I480 Paroxysmal atrial fibrillation: Secondary | ICD-10-CM | POA: Insufficient documentation

## 2017-05-14 DIAGNOSIS — I10 Essential (primary) hypertension: Secondary | ICD-10-CM | POA: Diagnosis not present

## 2017-05-19 DIAGNOSIS — M5136 Other intervertebral disc degeneration, lumbar region: Secondary | ICD-10-CM | POA: Diagnosis not present

## 2017-05-19 DIAGNOSIS — M545 Low back pain: Secondary | ICD-10-CM | POA: Diagnosis not present

## 2017-05-19 DIAGNOSIS — Z6841 Body Mass Index (BMI) 40.0 and over, adult: Secondary | ICD-10-CM | POA: Diagnosis not present

## 2017-05-19 DIAGNOSIS — G894 Chronic pain syndrome: Secondary | ICD-10-CM | POA: Diagnosis not present

## 2017-05-24 DIAGNOSIS — Z7901 Long term (current) use of anticoagulants: Secondary | ICD-10-CM | POA: Diagnosis not present

## 2017-05-24 DIAGNOSIS — Z5181 Encounter for therapeutic drug level monitoring: Secondary | ICD-10-CM | POA: Diagnosis not present

## 2017-06-07 DIAGNOSIS — Z7901 Long term (current) use of anticoagulants: Secondary | ICD-10-CM | POA: Diagnosis not present

## 2017-06-07 DIAGNOSIS — Z5181 Encounter for therapeutic drug level monitoring: Secondary | ICD-10-CM | POA: Diagnosis not present

## 2017-06-14 DIAGNOSIS — Z7901 Long term (current) use of anticoagulants: Secondary | ICD-10-CM | POA: Diagnosis not present

## 2017-06-14 DIAGNOSIS — Z5181 Encounter for therapeutic drug level monitoring: Secondary | ICD-10-CM | POA: Diagnosis not present

## 2017-06-22 DIAGNOSIS — E782 Mixed hyperlipidemia: Secondary | ICD-10-CM | POA: Diagnosis not present

## 2017-06-22 DIAGNOSIS — Z79899 Other long term (current) drug therapy: Secondary | ICD-10-CM | POA: Diagnosis not present

## 2017-06-22 DIAGNOSIS — Z125 Encounter for screening for malignant neoplasm of prostate: Secondary | ICD-10-CM | POA: Diagnosis not present

## 2017-06-22 DIAGNOSIS — E1140 Type 2 diabetes mellitus with diabetic neuropathy, unspecified: Secondary | ICD-10-CM | POA: Diagnosis not present

## 2017-06-22 DIAGNOSIS — I10 Essential (primary) hypertension: Secondary | ICD-10-CM | POA: Diagnosis not present

## 2017-06-24 DIAGNOSIS — I480 Paroxysmal atrial fibrillation: Secondary | ICD-10-CM | POA: Diagnosis not present

## 2017-06-24 DIAGNOSIS — R42 Dizziness and giddiness: Secondary | ICD-10-CM | POA: Diagnosis not present

## 2017-06-24 DIAGNOSIS — G6289 Other specified polyneuropathies: Secondary | ICD-10-CM | POA: Diagnosis not present

## 2017-06-25 DIAGNOSIS — M545 Low back pain: Secondary | ICD-10-CM | POA: Diagnosis not present

## 2017-06-25 DIAGNOSIS — G894 Chronic pain syndrome: Secondary | ICD-10-CM | POA: Diagnosis not present

## 2017-06-25 DIAGNOSIS — M5136 Other intervertebral disc degeneration, lumbar region: Secondary | ICD-10-CM | POA: Diagnosis not present

## 2017-06-28 DIAGNOSIS — E1140 Type 2 diabetes mellitus with diabetic neuropathy, unspecified: Secondary | ICD-10-CM | POA: Diagnosis not present

## 2017-06-28 DIAGNOSIS — I10 Essential (primary) hypertension: Secondary | ICD-10-CM | POA: Diagnosis not present

## 2017-06-28 DIAGNOSIS — I350 Nonrheumatic aortic (valve) stenosis: Secondary | ICD-10-CM | POA: Diagnosis not present

## 2017-06-28 DIAGNOSIS — Z7901 Long term (current) use of anticoagulants: Secondary | ICD-10-CM | POA: Diagnosis not present

## 2017-06-28 DIAGNOSIS — I6310 Cerebral infarction due to embolism of unspecified precerebral artery: Secondary | ICD-10-CM | POA: Diagnosis not present

## 2017-06-28 DIAGNOSIS — Z5181 Encounter for therapeutic drug level monitoring: Secondary | ICD-10-CM | POA: Diagnosis not present

## 2017-06-28 DIAGNOSIS — I480 Paroxysmal atrial fibrillation: Secondary | ICD-10-CM | POA: Diagnosis not present

## 2017-06-28 DIAGNOSIS — R42 Dizziness and giddiness: Secondary | ICD-10-CM | POA: Diagnosis not present

## 2017-07-09 ENCOUNTER — Ambulatory Visit: Payer: PPO | Attending: Neurology

## 2017-07-09 DIAGNOSIS — R296 Repeated falls: Secondary | ICD-10-CM

## 2017-07-09 DIAGNOSIS — R42 Dizziness and giddiness: Secondary | ICD-10-CM | POA: Diagnosis not present

## 2017-07-09 DIAGNOSIS — R2681 Unsteadiness on feet: Secondary | ICD-10-CM | POA: Diagnosis not present

## 2017-07-09 NOTE — Therapy (Signed)
[name] [facility] SERVICES [address] [place], [place], [address] Phone: [phone]   Fax:  [phone]  Physical Therapy Evaluation  Patient Details  Name: [name] MRN: [id number] Date of Birth: 1946/06/22 Referring Provider: Dr. Manuella Ghazi  Encounter Date: 07/09/2017      PT End of Session - 07/09/17 1230    Visit Number 1   Number of Visits 13   Date for PT Re-Evaluation 2017/08/26   Authorization Type g codes 1/10   PT Start Time 1015   PT Stop Time 1123   PT Time Calculation (min) 68 min   Equipment Utilized During Treatment Gait belt   Activity Tolerance Patient tolerated treatment well   Behavior During Therapy [facility] for tasks assessed/performed      Past Medical History:  Diagnosis Date  . Chronic pain   . Coronary artery disease   . Diabetes mellitus without complication ([place])   . DJD (degenerative joint disease)   . GERD (gastroesophageal reflux disease)   . Hyperlipemia   . Hypertension   . Insomnia   . Neuropathy     Past Surgical History:  Procedure Laterality Date  . BACK SURGERY    . CARDIAC SURGERY    . CARDIOVERSION N/A 05/04/2017   Procedure: CARDIOVERSION;  Surgeon: Corey Skains, MD;  Location: [facility] ORS;  Service: Cardiovascular;  Laterality: N/A;  . JOINT REPLACEMENT    . TEE WITHOUT CARDIOVERSION N/A 10/08/2016   Procedure: TRANSESOPHAGEAL ECHOCARDIOGRAM (TEE);  Surgeon: Teodoro Spray, MD;  Location: [facility] ORS;  Service: Cardiovascular;  Laterality: N/A;  . TEE WITHOUT CARDIOVERSION N/A 11/23/2016   Procedure: TRANSESOPHAGEAL ECHOCARDIOGRAM (TEE);  Surgeon: Isaias Cowman, MD;  Location: [facility] ORS;  Service: Cardiovascular;  Laterality: N/A;    There were no vitals filed for this visit.       Subjective Assessment - 07/09/17 1013    Subjective Dizziness/imbalance   Pertinent History Pt reports that he had 2 hospital visits for endocarditis starting in December 2017 and then January  2018. 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 He started having dizziness at that time. Symptoms have persisted since that time without improvement or worsening. He has a prolonged history of bilateral LE neuropathy over the course of multiple years. 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 He has a-fib, CAD, hyperlipidemia, and HTN. History of alcoholism in the past. 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 He reports recent hypotension and his BP medications have been modified. When asked about his symptoms pt reports  "It feels like I lose feeling in my head" Describes feeling "woozy." Denies vertigo. Reports feeling lightheaded like he might pass out. He had a syncopal episode 1.5 months ago. He has a visit with his cardiologist next week.   Limitations Walking   Diagnostic tests MRI brain 27/27/18: acute CVA superior L parietal and right external acpsul as well as chronic ischemia. CTA head/neck: multiple areas of severe stenosis   Patient Stated Goals Decrease symptoms and return to leisure activities   Currently in Pain? --  Not related to current episode            [facility] PT Assessment - 07/09/17 1016  Assessment   Medical Diagnosis Dizziness   Referring Provider Dr. Manuella Ghazi   Onset Date/Surgical Date 12/15/16   Hand Dominance Right   Next MD Visit Dr Manuella Ghazi in 6 months. Sees cardiology 07/16/17   Prior Therapy Has done Heart Track, had physical therapy at [facility] neurology for his neuropathy     Precautions   Precautions Fall     Restrictions   Weight Bearing Restrictions No     Balance Screen   Has the patient fallen in the past 6 months Yes   How many times? 10-15   Has the patient had a decrease in activity level because of  a fear of falling?  Yes   Is the patient reluctant to leave their home because of a fear of falling?  No     Home Ecologist residence   Living Arrangements Spouse/significant other   Available Help at Discharge Family   Type of [place] to enter   Entrance Stairs-Number of Steps 4   Gantt One level   Guys - single point;Walker - 2 wheels     Prior Function   Level of Independence Independent   Vocation Retired   Surveyor, minerals, woodworking, Animator   Overall Cognitive Status Within [employer] for tasks assessed     Observation/Other Assessments   Other Surveys  Other Surveys   Activities of Financial controller Scale (ABC Scale)  40.9%   Dizziness Handicap Inventory (DHI)  60/100     Standardized Balance Assessment   Standardized Balance Assessment Dynamic Gait Index     Dynamic Gait Index   Level Surface Mild Impairment   Change in Gait Speed Moderate Impairment   Gait with Horizontal Head Turns Moderate Impairment   Gait with Vertical Head Turns Moderate Impairment   Gait and Pivot Turn Severe Impairment   Step Over Obstacle Moderate Impairment   Step Around Obstacles Normal   Steps Moderate Impairment   Total Score 10           Objective measurements completed on examination: See above findings.      VESTIBULAR AND BALANCE EVALUATION  Onset Date: February 2018  HISTORY:  Subjective history of current problem: Pt reports that he had 2 hospital visits for endocarditis starting in December 2017 and then January 2018. 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 He started having dizziness at that time. Symptoms have persisted since that time without improvement or worsening. He has a prolonged history of  bilateral LE neuropathy over the course of multiple years. 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 He has a-fib, CAD, hyperlipidemia, and HTN. History of alcoholism in the past. 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 He reports recent hypotension and his BP medications have been modified. When asked about his symptoms pt reports  "It feels like I lose feeling in my head" Describes feeling "woozy." Denies vertigo. Reports feeling lightheaded like he might pass out. He had a syncopal episode 1.5 months ago. He has a visit with his cardiologist next week.   Description of dizziness: (vertigo, unsteadiness, lightheadedness, falling, general unsteadiness, aural fullness) "  It feels like I lose feeling in my head" Describes feeling "woozy." Denies vertigo. Reports feeling lightheaded like he might pass out. He had a syncopal episode 1.5 months ago.   Frequency: 5-6x/day  Duration: 5 minutes  Symptom nature: motion-provoked. Has happened spontaneously in the past as well.   Provocative Factors: standing up, walking and then performing turns (both left and right), going down stairs, no worsening with rolling in bed, no worsening when laying down. Denies vertigo Easing Factors: laying down, sitting down, stopping if walking  Progression of symptoms:  no change History of similar episodes: None  Falls (yes/no): Yes Number of falls in past 6 months: 10-15 falls from loss of balance  Prior Functional Level: Pt was previously playing golf, woodworking, and performing yardwork  Auditory complaints (tinnitus, pain, drainage): bilateral hearing loss which is insidious over the course of multiple years, denies tinnitus, pain, or drainage Vision (last eye exam, diplopia, recent changes): blurring bilateral, chronic but worsening, denies visual field cut but reports intermittent  floaters  Current Symptoms: Denies: dysarthria, dysphagia, bowel and bladder changes, recent weight loss/gain. Review of systems negative for red flags.     EXAMINATION  POSTURE: Flat low back posture  NEUROLOGICAL SCREEN: (2+ unless otherwise noted.) N=normal  Ab=abnormal  Level Dermatome R L Myotome R L Reflex R L  C3 Anterior Neck N N Sidebend C2-3 N N Jaw CN V    C4 Top of Shoulder N N Shoulder Shrug C4 N N Hoffman's UMN    C5 Lateral Upper Arm N N Shoulder ABD C4-5 N N Biceps C5-6    C6 Lateral Arm/ Thumb N N Arm Flex/ Wrist Ext C5-6 N N Brachiorad. C5-6    C7 Middle Finger N N Arm Ext//Wrist Flex C6-7 N N Triceps C7    C8 4th & 5th Finger N N Flex/ Ext Carpi Ulnaris C8 N N Patella    T1 Medial Arm N N Interossei T1 N N Gastrocnemius    L2 Medial thigh/groin N N Illiopsoas (L2-3) N N     L3 Lower thigh/med.knee N N Quadriceps (L3-4) N N Patellar (L3-4)    L4 Medial leg/lat thigh N N Tibialis Ant (L4-5) N N     L5 Lat. leg & dorsal foot A A EHL (L5) N N     S1 post/lat foot/thigh/leg A A Gastrocnemius (S1-2) N N Gastrocnemius (S1)    S2 Post./med. thigh & leg N N Hamstrings (L4-S3) N N Babinski     Reflex testing deferred, recently performed at neurologist  SOMATOSENSORY:         Sensation           Intact      Diminished         Absent  Light touch  Diminished starting 4cm above malleoli bilaterally in stocking distribuation         COORDINATION: Finger to Nose: Dysmetric LUE, normal RUE Pronator Drift: Positive LUE Rapid alternating movements normal   MUSCULOSKELETAL SCREEN: Cervical Spine ROM: Moderately limited in rotation, severely limited extension and lateral flexion, mild limited flexion   ROM: grossly WFL  MMT: Grossly 5/5 throughout with the exception of 4+/5 L ankle DF an 4/5 L shoulder abduction (chronic due to L shoulder injury)  Functional Mobility: Slow to transfer with UE support  Gait:Ambulates with spc in LUE  Scanning of visual environment  with gait is: significantly diminished. Pt with minimal spontaneous head turning with ambulation  Balance: DGI: 10/24   POSTURAL  CONTROL TESTS:   Clinical Test of Sensory Interaction for Balance    (CTSIB):  CONDITION TIME STRATEGY SWAY  Eyes open, firm surface 30 seconds ankle 1+  Eyes closed, firm surface 1.5s  4+  Eyes open, foam surface 30 seconds ankle 2+  Eyes closed, foam surface 0.8s  4+    OCULOMOTOR / VESTIBULAR TESTING:  Oculomotor Exam- Room Light  Normal Abnormal Comments  Ocular Alignment N    Ocular ROM N    Spontaneous Nystagmus N    End-Gaze Nystagmus N    Smooth Pursuit  A Very saccadic with mid range L horizontal nystagmus during L gaze  Saccades N    VOR  A Blurring and dizziness  VOR Cancellation  A Blurring and dizziness, easily observable difficulty maintaining gaze fixation on target  Left Head Thrust   Unable to get accurate result bilaterally due to difficulty relaxing and startle reflex  Right Head Thrust     Head Shaking Nystagmus N    Static Acuity     Dynamic Acuity       BPPV TESTS: Deferred at this time based on subjective history and CTA indicating multiple areas of severe stenosis particularly in the vertebral arteries  FUNCTIONAL OUTCOME MEASURES:  Results Comments  DHI 60 Moderate perception of handicap; in need of intervention  ABC Scale 40.9% Falls risk; in need of intervention  DGI 10/24 Falls risk; in need of intervention  10 meter Walking Speed      Orthostatic vitals:  Supine: BP: 137/64, HR: 65 Seated: BP: 116/62 HR: 65 Standing: BP: 135/68 HR: 68  Pt reports positive reproduction of his dizziness symptoms when moving from supine to sitting and sitting to standing;    Neuromuscular Re-education Pt provided written HEP with extensive education. Performed seated VOR x 1 horizontal for 60 seconds. Verbal cues for proper form/technique. Pt denies dizziness following.                         PT Education  - 07/09/17 1226    Education provided Yes   Education Details Plan of care and HEP   Person(s) Educated Patient   Methods Explanation;Demonstration;Verbal cues;Handout   Comprehension Verbalized understanding;Returned demonstration          PT Short Term Goals - 07/09/17 1242      PT SHORT TERM GOAL #1   Title Pt will be independent with HEP in order to improve strength and balance in order to decrease fall risk and improve function at home and with leisure activities   Time 6   Period Weeks   Status New   Target Date 08/20/17           PT Long Term Goals - 07/09/17 1243      PT LONG TERM GOAL #1   Title Pt will improve DGI by at least 3 points in order to demonstrate clinically significant improvement in balance and decreased risk for falls    Baseline 07/09/17: 10/24   Time 6   Period Weeks   Status New   Target Date 08/20/17     PT LONG TERM GOAL #2   Title Pt will improve ABC by at least 13% in order to demonstrate clinically significant improvement in balance confidence.    Baseline 07/09/17: 40.9%   Time 6   Period Weeks   Status New   Target Date 08/20/17     PT LONG TERM GOAL #3   Title Pt  will decrease DHI score by at least 18 points in order to demonstrate clinically significant reduction in disability    Baseline 07/09/17: 60/100   Time 6   Period Weeks   Status New   Target Date 08/20/17                Plan - 07/09/17 1230    Clinical Impression Statement Pt is a pleasant 71 yo male referred for dizziness. He suffered a CVA and was admitted to [facility] 12/15/16. 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 Pt reports that his dizziness symptoms started at that time and have been unchanged since. He reports 10-15 falls in the last 6 months and states that he has had syncopal episodes as well, the most recent of which occurred 1.5 months ago. He is scheduled to see his cardiologist  next week. His DHI is 60/100 and ABC is 40.9% both indicating significant handicap and need for intervention. History and exam are weighted toward central etiology for patient's symptoms. He is also on multiple medications which could be contributing to his symptoms. His orthostatic vitals today were positive when moving from supine to sitting with positive reproduction of patient's symptoms.  His smooth pursuit finger follow is very saccadic with mid range L horizontal nystagmus during L gaze. VOR cancellation is also positive. He does report dizziness and blurring with VOR testing. Pt struggles significantly with head turns during ambulation and his dynamic balance is very poor with DGI of 10/24. Pt also has chronic LE neuropathy and with eyes closed on is only able to balance for 1.5s on firm surface and 0.8s on foam. He will benefit from skilled PT services to address deficits in strength, balance, and gait in order to decrease fall risk and improve function at home and with leisure activities.   History and Personal Factors relevant to plan of care: greater than 3 personal factors/comorbidities, more than 4 body systems    Clinical Presentation Unstable   Clinical Presentation due to: symptoms are highly variable from one day to the next, with position changes, and with activities   Clinical Decision Making High   Rehab Potential Fair   PT Frequency 2x / week   PT Duration 6 weeks   PT Treatment/Interventions ADLs/Self Care Home Management;Aquatic Therapy;Biofeedback;Canalith Repostioning;Cryotherapy;Electrical Stimulation;Iontophoresis 4mg /ml Dexamethasone;Moist Heat;Traction;Ultrasound;Gait training;Stair training;Functional mobility training;Therapeutic activities;Therapeutic exercise;DME Instruction;Balance training;Neuromuscular re-education;Patient/family education;Manual techniques;Energy conservation;Vestibular;Visual/perceptual remediation/compensation   PT Next Visit Plan review HEP, BERG,  review HH PT balance exercises, possibly address walker usage   PT Home Exercise Plan VOR x 1 horizontal in sitting with back support 1 min x 3, 4x/day   Consulted and Agree with Plan of Care Patient      Patient will benefit from skilled therapeutic intervention in order to improve the following deficits and impairments:  Abnormal gait, Decreased balance, Decreased safety awareness, Decreased strength, Dizziness  Visit Diagnosis: Dizziness and giddiness - Plan: PT plan of care cert/re-cert  Repeated falls - Plan: PT plan of care cert/re-cert  Unsteadiness on feet - Plan: PT plan of care cert/re-cert      G-Codes - 02/58/52 1253    Functional Assessment Tool Used (Outpatient Only) ABC, DHI, DGI, clinical judgement   Functional Limitation Mobility: Walking and moving around   Mobility: Walking and Moving Around Current Status ([id number]) At least 40 percent but less than 60 percent impaired, limited or restricted   Mobility: Walking and Moving Around Goal  Status [phone]) At least 20 percent but less than 40 percent impaired, limited or restricted       Problem List Patient Active Problem List   Diagnosis Date Noted  . TIA (transient ischemic attack) 12/15/2016  . CVA (cerebral vascular accident) ([place]) 12/15/2016  . Septic shock ([place]) 11/19/2016  . Pressure injury of skin 10/03/2016  . Sepsis ([place]) 10/02/2016   Phillips Grout PT, DPT   Huprich,Jason 07/09/2017, 12:57 PM  [place] MAIN [facility] SERVICES [address] [place], [place], [address] Phone: [phone]   Fax:  [phone]  Name: [name] MRN: [id number] Date of Birth: 1946-04-26

## 2017-07-13 ENCOUNTER — Ambulatory Visit: Payer: PPO | Admitting: Physical Therapy

## 2017-07-13 ENCOUNTER — Encounter: Payer: Self-pay | Admitting: Physical Therapy

## 2017-07-13 VITALS — BP 145/89 | HR 68

## 2017-07-13 DIAGNOSIS — R42 Dizziness and giddiness: Secondary | ICD-10-CM | POA: Diagnosis not present

## 2017-07-13 DIAGNOSIS — R296 Repeated falls: Secondary | ICD-10-CM

## 2017-07-13 DIAGNOSIS — R2681 Unsteadiness on feet: Secondary | ICD-10-CM

## 2017-07-13 NOTE — Therapy (Signed)
[place] MAIN [facility] SERVICES [address] [place], [place], [address] Phone: [phone]   Fax:  [phone]  Physical Therapy Treatment  Patient Details  Name: [name] MRN: [id number] Date of Birth: 1946/01/24 Referring Provider: Dr. Manuella Ghazi  Encounter Date: 07/13/2017      PT End of Session - 07/13/17 1029    Visit Number 2   Number of Visits 13   Date for PT Re-Evaluation 2017/09/04   Authorization Type g codes 13-Dec-2022   PT Start Time 1024   PT Stop Time 1123   PT Time Calculation (min) 59 min   Equipment Utilized During Treatment Gait belt   Activity Tolerance Patient tolerated treatment well   Behavior During Therapy [facility] for tasks assessed/performed      Past Medical History:  Diagnosis Date  . Chronic pain   . Coronary artery disease   . Diabetes mellitus without complication ([place])   . DJD (degenerative joint disease)   . GERD (gastroesophageal reflux disease)   . Hyperlipemia   . Hypertension   . Insomnia   . Neuropathy     Past Surgical History:  Procedure Laterality Date  . BACK SURGERY    . CARDIAC SURGERY    . CARDIOVERSION N/A 05/04/2017   Procedure: CARDIOVERSION;  Surgeon: Corey Skains, MD;  Location: [facility] ORS;  Service: Cardiovascular;  Laterality: N/A;  . JOINT REPLACEMENT    . TEE WITHOUT CARDIOVERSION N/A 10/08/2016   Procedure: TRANSESOPHAGEAL ECHOCARDIOGRAM (TEE);  Surgeon: Teodoro Spray, MD;  Location: [facility] ORS;  Service: Cardiovascular;  Laterality: N/A;  . TEE WITHOUT CARDIOVERSION N/A 11/23/2016   Procedure: TRANSESOPHAGEAL ECHOCARDIOGRAM (TEE);  Surgeon: Isaias Cowman, MD;  Location: [facility] ORS;  Service: Cardiovascular;  Laterality: N/A;    Vitals:   07/13/17 1030  BP: (!) 145/89  Pulse: 68  SpO2: 97%        Subjective Assessment - 07/13/17 1135    Subjective Pt reports he has been completing his VOR exercise in standing on a pillow and it is challenging for him not to lose his  balance with this.  He reports he becomes most dizzy with position changes and when turning.  He has not had a fall since last session but has had several near falls after losing his balance.  Pt did not bring his HHPT exercises this session but will do so next session so they may be reviewed.    Pertinent History Pt reports that he had 2 hospital visits for endocarditis starting in December 2017 and then January 2018. 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 He started having dizziness at that time. Symptoms have persisted since that time without improvement or worsening. He has a prolonged history of bilateral LE neuropathy over the course of multiple years. 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 He has a-fib, CAD, hyperlipidemia, and HTN. History of alcoholism in the past. 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 He reports recent hypotension and his BP medications have been modified. When asked about his symptoms pt reports  "It feels like I lose feeling in my head" Describes feeling "woozy." Denies vertigo. Reports feeling lightheaded like he might pass out. He had a syncopal episode  1.5 months ago. He has a visit with his cardiologist next week.   Limitations Walking   Diagnostic tests MRI brain 27/27/18: acute CVA superior L parietal and right external acpsul as well as chronic ischemia. CTA head/neck: multiple areas of severe stenosis   Patient Stated Goals Decrease symptoms and return to leisure activities       Therapeutic Exercise:   VOR x1 horizontal in sitting with back support x1 minute x3.  Reviewed this as part of HEP as pt reports he was doing this at home standing on a pillow and moving his head as fast as he could and was having  trouble and losing his balance.   Ambulating in hallway with horizontal head turns and pt reporting aloud the cards on the wall.  x400 ft.        Neuromuscular Re-Ed:   Marching on airex pad x20 each LE  [place]berg on airex pad with ball toss to L and R x15 each direction  Tandem walking in // bars x6 lengths  Sit>stand with UE support and cues and demonstration to scoot to edge of seat prior to standing, then ambulating into // bars and turning and returning to chair to sit.  Cues for controlled descent reaching back for armrests and using BLE strength.   Obstacle course including stepping up onto and over airex pad, stepping over small boxes, weaving in and out of cones.  Completed x 8 minutes.    Pt with intermittent LOB requiring intermittent assist from // bars or min assist from this PT to maintain balance with all exercises challenging his balance this session.          [place] Adult PT Treatment/Exercise - 07/13/17 1141      Standardized Balance Assessment   Standardized Balance Assessment Berg Balance Test     Berg Balance Test   Sit to Stand Able to stand using hands after several tries   Standing Unsupported Able to stand safely 2 minutes   Sitting with Back Unsupported but Feet Supported on Floor or Stool Able to sit safely and securely 2 minutes   Stand to Sit Controls descent by using hands   Transfers Able to transfer with verbal cueing and /or supervision   Standing Unsupported with Eyes Closed Able to stand 10 seconds with supervision   Standing Ubsupported with Feet Together Able to place feet together independently but unable to hold for 30 seconds   From Standing, Reach Forward with Outstretched Arm Can reach forward >5 cm safely (2")   From Standing Position, Pick up Object from Floor Able to pick up shoe, needs supervision   From Standing Position, Turn to Look Behind Over each Shoulder Looks behind one side only/other side shows less weight shift   Turn  360 Degrees Able to turn 360 degrees safely but slowly   Standing Unsupported, Alternately Place Feet on Step/Stool Able to complete >2 steps/needs minimal assist   Standing Unsupported, One Foot in [employer] balance while stepping or standing   Standing on One Leg Unable to try or needs assist to prevent fall   Total Score 31                PT Education - 07/13/17 1025    Education provided Yes   Education Details Exercise technique; taking his time with transitions due to orthostatic hypotension   Person(s) Educated Patient   Methods Explanation;Demonstration;Verbal cues   Comprehension Verbalized understanding;Returned demonstration;Verbal cues required;Need further instruction  PT Short Term Goals - 07/09/17 1242      PT SHORT TERM GOAL #1   Title Pt will be independent with HEP in order to improve strength and balance in order to decrease fall risk and improve function at home and with leisure activities   Time 6   Period Weeks   Status New   Target Date 08/20/17           PT Long Term Goals - 07/13/17 1146      PT LONG TERM GOAL #1   Title Pt will improve DGI by at least 3 points in order to demonstrate clinically significant improvement in balance and decreased risk for falls    Baseline 07/09/17: 10/24   Time 6   Period Weeks   Status New     PT LONG TERM GOAL #2   Title Pt will improve ABC by at least 13% in order to demonstrate clinically significant improvement in balance confidence.    Baseline 07/09/17: 40.9%   Time 6   Period Weeks   Status New     PT LONG TERM GOAL #3   Title Pt will decrease DHI score by at least 18 points in order to demonstrate clinically significant reduction in disability    Baseline 07/09/17: 60/100   Time 6   Period Weeks   Status New     PT LONG TERM GOAL #4   Title Pt will improve Berg Balance score by at least 10 points to demonstrate decreased risk of falling.     Baseline 07/13/17: 31/56   Time 4    Period Weeks   Status New               Plan - 07/13/17 1137    Clinical Impression Statement Pt demonstrates instability and LOB with SLS exercises, turning when ambulating, and tandem exercises.  He scored 31/56 on the [employer], indicating pt is at an increased risk of falling.  Results explained to the patient. Emphasized proper and safe technique for sit<>stand which pt was instructed to practice at home as part of his HEP.  He was educated on orthostatic hypotension and the importance of taking his time when changing positions.  He will benefit from continued skilled PT interventions for improved balance and decreased risk of falling.    Rehab Potential Fair   PT Frequency 2x / week   PT Duration 6 weeks   PT Treatment/Interventions ADLs/Self Care Home Management;Aquatic Therapy;Biofeedback;Canalith Repostioning;Cryotherapy;Electrical Stimulation;Iontophoresis 4mg /ml Dexamethasone;Moist Heat;Traction;Ultrasound;Gait training;Stair training;Functional mobility training;Therapeutic activities;Therapeutic exercise;DME Instruction;Balance training;Neuromuscular re-education;Patient/family education;Manual techniques;Energy conservation;Vestibular;Visual/perceptual remediation/compensation   PT Next Visit Plan review HEP, review HH PT balance exercises, possibly address walker usage   PT Home Exercise Plan VOR x 1 horizontal in sitting with back support 1 min x 3, 4x/day; Sit<>stand with use of BUE   Consulted and Agree with Plan of Care Patient      Patient will benefit from skilled therapeutic intervention in order to improve the following deficits and impairments:  Abnormal gait, Decreased balance, Decreased safety awareness, Decreased strength, Dizziness  Visit Diagnosis: Dizziness and giddiness  Repeated falls  Unsteadiness on feet     Problem List Patient Active Problem List   Diagnosis Date Noted  . TIA (transient ischemic attack) 12/15/2016  . CVA (cerebral  vascular accident) ([place]) 12/15/2016  . Septic shock ([place]) 11/19/2016  . Pressure injury of skin 10/03/2016  . Sepsis ([place]) 10/02/2016    Collie Siad PT, DPT 07/13/2017, 11:47 AM  [place] MAIN [facility] SERVICES [address] [place], [place], [address] Phone: [phone]   Fax:  [phone]  Name: ANTWAIN CALIENDO MRN: [id number] Date of Birth: 1946/04/20

## 2017-07-20 ENCOUNTER — Ambulatory Visit: Payer: PPO | Attending: Neurology

## 2017-07-20 VITALS — BP 146/76 | HR 65

## 2017-07-20 DIAGNOSIS — R2681 Unsteadiness on feet: Secondary | ICD-10-CM | POA: Insufficient documentation

## 2017-07-20 DIAGNOSIS — I480 Paroxysmal atrial fibrillation: Secondary | ICD-10-CM | POA: Diagnosis not present

## 2017-07-20 DIAGNOSIS — I10 Essential (primary) hypertension: Secondary | ICD-10-CM | POA: Diagnosis not present

## 2017-07-20 DIAGNOSIS — R42 Dizziness and giddiness: Secondary | ICD-10-CM | POA: Diagnosis not present

## 2017-07-20 DIAGNOSIS — Z7901 Long term (current) use of anticoagulants: Secondary | ICD-10-CM | POA: Diagnosis not present

## 2017-07-20 DIAGNOSIS — R296 Repeated falls: Secondary | ICD-10-CM | POA: Diagnosis not present

## 2017-07-20 DIAGNOSIS — I2510 Atherosclerotic heart disease of native coronary artery without angina pectoris: Secondary | ICD-10-CM | POA: Diagnosis not present

## 2017-07-20 DIAGNOSIS — I350 Nonrheumatic aortic (valve) stenosis: Secondary | ICD-10-CM | POA: Diagnosis not present

## 2017-07-20 DIAGNOSIS — Z5181 Encounter for therapeutic drug level monitoring: Secondary | ICD-10-CM | POA: Diagnosis not present

## 2017-07-20 NOTE — Therapy (Signed)
[place] MAIN [facility] SERVICES [address] [place], [place], [address] Phone: [phone]   Fax:  [phone]  Physical Therapy Treatment  Patient Details  Name: [name] MRN: [id number] Date of Birth: October 19, 1946 Referring Provider: Dr. Manuella Ghazi  Encounter Date: 07/20/2017      PT End of Session - 07/20/17 1021    Visit Number 3   Number of Visits 13   Date for PT Re-Evaluation 10-Sep-2017   Authorization Type g codes 01-17-23   PT Start Time 1025   PT Stop Time 1110   PT Time Calculation (min) 45 min   Equipment Utilized During Treatment Gait belt   Activity Tolerance Patient tolerated treatment well   Behavior During Therapy [facility] for tasks assessed/performed      Past Medical History:  Diagnosis Date  . Chronic pain   . Coronary artery disease   . Diabetes mellitus without complication ([place])   . DJD (degenerative joint disease)   . GERD (gastroesophageal reflux disease)   . Hyperlipemia   . Hypertension   . Insomnia   . Neuropathy     Past Surgical History:  Procedure Laterality Date  . BACK SURGERY    . CARDIAC SURGERY    . CARDIOVERSION N/A 05/04/2017   Procedure: CARDIOVERSION;  Surgeon: Corey Skains, MD;  Location: [facility] ORS;  Service: Cardiovascular;  Laterality: N/A;  . JOINT REPLACEMENT    . TEE WITHOUT CARDIOVERSION N/A 10/08/2016   Procedure: TRANSESOPHAGEAL ECHOCARDIOGRAM (TEE);  Surgeon: Teodoro Spray, MD;  Location: [facility] ORS;  Service: Cardiovascular;  Laterality: N/A;  . TEE WITHOUT CARDIOVERSION N/A 11/23/2016   Procedure: TRANSESOPHAGEAL ECHOCARDIOGRAM (TEE);  Surgeon: Isaias Cowman, MD;  Location: [facility] ORS;  Service: Cardiovascular;  Laterality: N/A;    Vitals:   07/20/17 1033  BP: (!) 146/76  Pulse: 65  SpO2: 98%        Subjective Assessment - 07/20/17 1021    Subjective Pt states that he is feeling unwell today. He started to feel like his heart was racing on Sunday. He has an appointment  with his cardiologist today at 11:45. Denies any chest pain today or SOB.    Pertinent History Pt reports that he had 2 hospital visits for endocarditis starting in December 2017 and then January 2018. 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 He started having dizziness at that time. Symptoms have persisted since that time without improvement or worsening. He has a prolonged history of bilateral LE neuropathy over the course of multiple years. 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 He has a-fib, CAD, hyperlipidemia, and HTN. History of alcoholism in the past. 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 He reports recent hypotension and his BP medications have been modified. When asked about his symptoms pt reports  "It feels like I lose feeling in my head" Describes feeling "woozy." Denies vertigo. Reports feeling lightheaded like he might pass out. He had a syncopal episode 1.5 months ago. He has a visit with his cardiologist next week.   Limitations Walking   Diagnostic tests MRI brain 27/27/18: acute CVA superior L parietal and right external acpsul as well as chronic ischemia. CTA head/neck: multiple areas of severe stenosis  Patient Stated Goals Decrease symptoms and return to leisure activities   Currently in Pain? No/denies        TREATMENT   Neuromuscular Re-Ed:  VOR VOR x1 horizontal in sitting with back support x 1 minute x 2, in standing x 1 minute with wide stance and no UE support; Pt requiring cues for proper speed during exercise as he tends to move very slowly initially. He also tends to decrease his rotational excursion as time progresses. Pt requires verbal and tactile cues to increase rotation. Denies dizziness throughout;    Semi-tandem Semi tandem progressions with static balance followed by horizontal and vertical head turns alternating forward LE x multiple bouts on each LE;  Quick Turns Performed ambulation in bouts of 10-15' with quick turns, initially L, then R, then alternating x multiple bouts, pt reports 6/10 dizziness with quick turns;  Airex Marching on airex pad x20 each LE; Airex step taps alternating LE x 10 each;  1/2 Foam Roll Balance on 1/2 foam roll with round side up and feet shoulder width apart 30s x multiple bouts;  Pt provided semitandem balance with horizontal head turns to add to HEP. Handout provided with extensive education about how to perform safely at home;  Pt with intermittent LOB requiring intermittent assist from // bars or min assist from this PT to maintain balance with all exercises challenging his balance this session.    Ther-ex Sit>stand with intermittent UE support 2 x 10, cues provided to control descent;                         PT Education - 07/20/17 1021    Education provided Yes   Education Details Exercise form/technique, HEP progression   Person(s) Educated Patient   Methods Explanation   Comprehension Verbalized understanding          PT Short Term Goals - 07/09/17 1242      PT SHORT TERM GOAL #1   Title Pt will be independent with HEP in order to improve strength and balance in order to decrease fall risk and improve function at home and with leisure activities   Time 6   Period Weeks   Status New   Target Date 08/20/17           PT Long Term Goals - 07/13/17 1146      PT LONG TERM GOAL #1   Title Pt will improve DGI by at least 3 points in order to demonstrate clinically significant improvement in balance and decreased risk for falls    Baseline 07/09/17: 10/24   Time 6   Period Weeks   Status New     PT LONG TERM GOAL #2   Title Pt will improve ABC by at least 13% in order to demonstrate clinically  significant improvement in balance confidence.    Baseline 07/09/17: 40.9%   Time 6   Period Weeks   Status New     PT LONG TERM GOAL #3   Title Pt will decrease DHI score by at least 18 points in order to demonstrate clinically significant reduction in disability    Baseline 07/09/17: 60/100   Time 6   Period Weeks   Status New     PT LONG TERM GOAL #4   Title Pt will improve Berg Balance score by at least 10 points to demonstrate decreased risk of falling.     Baseline 07/13/17: 31/56   Time 4   Period  Weeks   Status New               Plan - 07/20/17 1022    Clinical Impression Statement Vitals are normal today and heart beat is regularly regular to ausculatation. He denies chest pain and would like to continue with PT session. Pt has cardiology appointment following PT appointment. Pt is very unsteady today with semitandem head turns and when on unstable surfaces such as Airex pad. He continues to struggle with proper form/technique with VOR x 1 horizontal. Pt reports increase in dizziness today with quick turns during ambulation. Pt provided with additional exercise to add to HEP and encouraged to follow-up as scheduled. He will continue to benefit from skilled PT services to address deficits in balance and decrease his risk for future falls.    Clinical Presentation Unstable   Clinical Decision Making High   Rehab Potential Fair   PT Frequency 2x / week   PT Duration 6 weeks   PT Treatment/Interventions ADLs/Self Care Home Management;Aquatic Therapy;Biofeedback;Canalith Repostioning;Cryotherapy;Electrical Stimulation;Iontophoresis 4mg /ml Dexamethasone;Moist Heat;Traction;Ultrasound;Gait training;Stair training;Functional mobility training;Therapeutic activities;Therapeutic exercise;DME Instruction;Balance training;Neuromuscular re-education;Patient/family education;Manual techniques;Energy conservation;Vestibular;Visual/perceptual remediation/compensation   PT Next Visit Plan  review HEP, review HH PT balance exercises, possibly address walker usage   PT Home Exercise Plan VOR x 1 horizontal in sitting with back support 1 min x 3, 4x/day; Sit<>stand with use of BUE, semitandem balance with horizontal head turns;   Consulted and Agree with Plan of Care Patient      Patient will benefit from skilled therapeutic intervention in order to improve the following deficits and impairments:  Abnormal gait, Decreased balance, Decreased safety awareness, Decreased strength, Dizziness  Visit Diagnosis: Dizziness and giddiness  Repeated falls  Unsteadiness on feet     Problem List Patient Active Problem List   Diagnosis Date Noted  . TIA (transient ischemic attack) 12/15/2016  . CVA (cerebral vascular accident) ([place]) 12/15/2016  . Septic shock ([place]) 11/19/2016  . Pressure injury of skin 10/03/2016  . Sepsis ([place]) 10/02/2016   Phillips Grout PT, DPT   Willye Javier 07/20/2017, 11:41 AM  [place] MAIN [facility] SERVICES [address] [place], [place], [address] Phone: [phone]   Fax:  [phone]  Name: AUBRY TUCHOLSKI MRN: [id number] Date of Birth: 1946/09/10

## 2017-07-27 ENCOUNTER — Ambulatory Visit: Payer: PPO | Admitting: Physical Therapy

## 2017-08-02 DIAGNOSIS — M5137 Other intervertebral disc degeneration, lumbosacral region: Secondary | ICD-10-CM | POA: Diagnosis not present

## 2017-08-02 DIAGNOSIS — Z79899 Other long term (current) drug therapy: Secondary | ICD-10-CM | POA: Diagnosis not present

## 2017-08-02 DIAGNOSIS — M545 Low back pain: Secondary | ICD-10-CM | POA: Diagnosis not present

## 2017-08-02 DIAGNOSIS — G894 Chronic pain syndrome: Secondary | ICD-10-CM | POA: Diagnosis not present

## 2017-08-02 DIAGNOSIS — Z79891 Long term (current) use of opiate analgesic: Secondary | ICD-10-CM | POA: Diagnosis not present

## 2017-08-03 ENCOUNTER — Ambulatory Visit: Payer: PPO | Admitting: Physical Therapy

## 2017-08-03 ENCOUNTER — Encounter: Payer: Self-pay | Admitting: Physical Therapy

## 2017-08-03 DIAGNOSIS — R42 Dizziness and giddiness: Secondary | ICD-10-CM

## 2017-08-03 DIAGNOSIS — R2681 Unsteadiness on feet: Secondary | ICD-10-CM

## 2017-08-03 DIAGNOSIS — Z5181 Encounter for therapeutic drug level monitoring: Secondary | ICD-10-CM | POA: Diagnosis not present

## 2017-08-03 DIAGNOSIS — R296 Repeated falls: Secondary | ICD-10-CM

## 2017-08-03 DIAGNOSIS — Z7901 Long term (current) use of anticoagulants: Secondary | ICD-10-CM | POA: Diagnosis not present

## 2017-08-03 NOTE — Therapy (Signed)
[place] MAIN [facility] SERVICES [address] [place], [place], [address] Phone: [phone]   Fax:  [phone]  Physical Therapy Treatment  Patient Details  Name: [name] MRN: [id number] Date of Birth: 1945-10-23 Referring Provider: Dr. Manuella Ghazi  Encounter Date: 08/03/2017      PT End of Session - 08/03/17 1011    Visit Number 4   Number of Visits 13   Date for PT Re-Evaluation 2017/08/21   Authorization Type g codes 01-28-23   PT Start Time 0626   PT Stop Time 1108   PT Time Calculation (min) 54 min   Equipment Utilized During Treatment Gait belt   Activity Tolerance Patient tolerated treatment well   Behavior During Therapy [facility] for tasks assessed/performed      Past Medical History:  Diagnosis Date  . Chronic pain   . Coronary artery disease   . Diabetes mellitus without complication ([place])   . DJD (degenerative joint disease)   . GERD (gastroesophageal reflux disease)   . Hyperlipemia   . Hypertension   . Insomnia   . Neuropathy     Past Surgical History:  Procedure Laterality Date  . BACK SURGERY    . CARDIAC SURGERY    . CARDIOVERSION N/A 05/04/2017   Procedure: CARDIOVERSION;  Surgeon: Corey Skains, MD;  Location: [facility] ORS;  Service: Cardiovascular;  Laterality: N/A;  . JOINT REPLACEMENT    . TEE WITHOUT CARDIOVERSION N/A 10/08/2016   Procedure: TRANSESOPHAGEAL ECHOCARDIOGRAM (TEE);  Surgeon: Teodoro Spray, MD;  Location: [facility] ORS;  Service: Cardiovascular;  Laterality: N/A;  . TEE WITHOUT CARDIOVERSION N/A 11/23/2016   Procedure: TRANSESOPHAGEAL ECHOCARDIOGRAM (TEE);  Surgeon: Isaias Cowman, MD;  Location: [facility] ORS;  Service: Cardiovascular;  Laterality: N/A;    There were no vitals filed for this visit.      Subjective Assessment - 08/03/17 1011    Subjective Patient reports that he had an EKG performed at the cardiologist office 2 weeks ago and patient reports that the EKG was normal. Patient reports no  concerns noted at the cardiology office. Patient reports he has a difficult time with heel toe exercise.    Pertinent History Pt reports that he had 2 hospital visits for endocarditis starting in December 2017 and then January 2018. 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 He started having dizziness at that time. Symptoms have persisted since that time without improvement or worsening. He has a prolonged history of bilateral LE neuropathy over the course of multiple years. 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 He has a-fib, CAD, hyperlipidemia, and HTN. History of alcoholism in the past. 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 He reports recent hypotension and his BP medications have been modified. When asked about his symptoms pt reports  "It feels like I lose feeling in my head" Describes feeling "woozy." Denies vertigo. Reports feeling lightheaded like he might pass out. He had a syncopal episode 1.5 months ago. He has a visit with his cardiologist next week.   Limitations Walking   Diagnostic tests MRI brain 27/27/18: acute CVA superior L parietal and right external acpsul as well as chronic ischemia. CTA head/neck: multiple areas of severe stenosis   Patient  Stated Goals Decrease symptoms and return to leisure activities   Currently in Pain? No/denies      Neuromuscular Re-education:  VOR x 1 exercise: Patient performed VOR X 1 horizontal in standing 1 rep of 1 minute with plain background and then 2 reps of 1 minute with conflicting background with verbal cues for technique to not move shoulders side to side only head. Patient reports mild dizziness symptoms with conflicting background progression and added this to HEP.   Semi-tandem stance  Progressions: On firm surface, patient performed semi-tandem stance progressions with and without horizontal and vertical head turns with CGA with verbal cues for technique with foot placement. Patient had reported that he was frustrated with trying to perform semi-tandem stance exercise at home but he was standing with feet in full tandem stance position, discussed working on semi-tandem progressions. Demonstrated and patient repeated back. Patient reports that this is an improvement and feels more confident with trying to do this exercise at home.   Ambulation with head turns:  Patient performed 30' trials of forwards and retro ambulation with horizontal and forwards ambulation with vertical head turns with CGA with use of SPC.  Patient demonstrates increased difficulty with retro ambulation with and without head turns horizontally with patient taking smaller steps with decreased step height and decreased cadence and narrow base of support. Patient with several small losses of balance with retro ambulation requiring CGA to Min A to correct.   Airex balance beam: On Airex balance beam, performed sideways stepping left/right with faded UEs support progressing to no UEs support 5' times multiple reps.  On Airex balance beam, performed sideways stepping with horizontal head turns 5' times 4 reps with faded UEs support progressing to intermittent UEs support.  Ball tracking : Patient performed static sitting while holding ball while moving ball horizontally and then vertically while tracking ball with head and eyes with verbal cues to turn the head. Added this exercise to HEP and handout provided. Patient reports mild increased dizziness with this activity.   Diona Foley toss to self:  Patient performed static sitting while tossing ball to self horizontally and then vertically while tracking ball with head and eyes. Patient denies dizziness with this activity.  Ball toss over shoulder: Patient performed in  static standing tossing ball over one shoulder with return catch over opposite shoulder with CGA in // bars. Then, attempted to repeat ball toss activity with returning catch over opposite shoulder varying the ball position to head, shoulder and waist level to promote head turning and tilting with CGA in // bars; however, patient has chronic left shoulder deficits and had difficulty with varying ball height positions.       PT Education - 08/03/17 1011    Education provided Yes   Education Details reviewed HEP, discussed technique, added conflicting background progression to VOR X1; added sitting ball follows horizontally and vertically; reviewed goals with pt   Person(s) Educated Patient   Methods Explanation;Demonstration;Verbal cues;Handout   Comprehension Verbalized understanding;Returned demonstration          PT Short Term Goals - 08/03/17 1129      PT SHORT TERM GOAL #1   Title Pt will be independent with HEP in order to improve strength and balance in order to decrease fall risk and improve function at home and with leisure activities   Time 6   Period Weeks   Status Partially Met           PT Long Term Goals -  07/13/17 1146      PT LONG TERM GOAL #1   Title Pt will improve DGI by at least 3 points in order to demonstrate clinically significant improvement in balance and decreased risk for falls    Baseline 07/09/17: 10/24   Time 6   Period Weeks   Status New     PT LONG TERM GOAL #2   Title Pt will improve ABC by at least 13% in order to demonstrate clinically significant improvement in balance confidence.    Baseline 07/09/17: 40.9%   Time 6   Period Weeks   Status New     PT LONG TERM GOAL #3   Title Pt will decrease DHI score by at least 18 points in order to demonstrate clinically significant reduction in disability    Baseline 07/09/17: 60/100   Time 6   Period Weeks   Status New     PT LONG TERM GOAL #4   Title Pt will improve Berg Balance score by at  least 10 points to demonstrate decreased risk of falling.     Baseline 07/13/17: 31/56   Time 4   Period Weeks   Status New               Plan - 08/03/17 1012    Clinical Impression Statement Patient demonstrates improved technique with VOR exercise but needed verbal cuing on how to perform semi-tandem stance progressions for home exercise program. Patient able to progress to VOR X 1 with conflicting background and patient reports that plain VOR exercise was not making him as dizzy as it did intially when he was doing his HEP. Patient demonstrates difficulty with dynamic standing balance activities with head turns as well as with uneven surfaces and narrow BOS activities. Patient is at risk for falls and would benefit from continued PT services at this time. Will plan on repeating functional outcome measures in the next 2 visits.   Rehab Potential Fair   PT Frequency 2x / week   PT Duration 6 weeks   PT Treatment/Interventions ADLs/Self Care Home Management;Aquatic Therapy;Biofeedback;Canalith Repostioning;Cryotherapy;Electrical Stimulation;Iontophoresis 51m/ml Dexamethasone;Moist Heat;Traction;Ultrasound;Gait training;Stair training;Functional mobility training;Therapeutic activities;Therapeutic exercise;DME Instruction;Balance training;Neuromuscular re-education;Patient/family education;Manual techniques;Energy conservation;Vestibular;Visual/perceptual remediation/compensation   PT Next Visit Plan review HEP, review HH PT balance exercises, possibly address walker usage   PT Home Exercise Plan VOR x 1 horizontal in sitting with back support 1 min x 3, 4x/day; Sit<>stand with use of BUE, semitandem balance with horizontal head turns;   Consulted and Agree with Plan of Care Patient      Patient will benefit from skilled therapeutic intervention in order to improve the following deficits and impairments:  Abnormal gait, Decreased balance, Decreased safety awareness, Decreased strength,  Dizziness  Visit Diagnosis: Dizziness and giddiness  Repeated falls  Unsteadiness on feet     Problem List Patient Active Problem List   Diagnosis Date Noted  . TIA (transient ischemic attack) 12/15/2016  . [place]  . [place] 11/19/2016  . Pressure injury of skin 10/03/2016  . Sepsis ([place] 10/02/2016   DLady DeutscherPT, DPT #[id number] Deutscher10/16/2018, 11:54 AM  [place] [facility] [address] [address]Phone: [phone]  Fax:  [phone] Name: [name] MAYHALLMRN: [id number] of Birth: 901-17-1947

## 2017-08-10 ENCOUNTER — Ambulatory Visit: Payer: PPO | Admitting: Physical Therapy

## 2017-08-10 ENCOUNTER — Encounter: Payer: Self-pay | Admitting: Physical Therapy

## 2017-08-10 DIAGNOSIS — R2681 Unsteadiness on feet: Secondary | ICD-10-CM

## 2017-08-10 DIAGNOSIS — R42 Dizziness and giddiness: Secondary | ICD-10-CM | POA: Diagnosis not present

## 2017-08-10 DIAGNOSIS — R296 Repeated falls: Secondary | ICD-10-CM

## 2017-08-10 NOTE — Therapy (Signed)
[place] MAIN [facility] SERVICES [address] [place], [place], [address] Phone: [phone]   Fax:  [phone]  Physical Therapy Treatment  Patient Details  Name: [name] MRN: [id number] Date of Birth: September 18, 1946 Referring Provider: Dr. Manuella Ghazi  Encounter Date: 08/10/2017      PT End of Session - 08/13/17 1215-12-05    Visit Number 5   Number of Visits 13   Date for PT Re-Evaluation 21-Aug-2017   Authorization Type g codes 5/10   PT Start Time 1032/12/04   PT Stop Time 12-04-25   PT Time Calculation (min) 53 min   Equipment Utilized During Treatment Gait belt   Activity Tolerance Patient tolerated treatment well   Behavior During Therapy [facility] for tasks assessed/performed      Past Medical History:  Diagnosis Date  . Chronic pain   . Coronary artery disease   . Diabetes mellitus without complication ([place])   . DJD (degenerative joint disease)   . GERD (gastroesophageal reflux disease)   . Hyperlipemia   . Hypertension   . Insomnia   . Neuropathy     Past Surgical History:  Procedure Laterality Date  . BACK SURGERY    . CARDIAC SURGERY    . CARDIOVERSION N/A 05/04/2017   Procedure: CARDIOVERSION;  Surgeon: Corey Skains, MD;  Location: [facility] ORS;  Service: Cardiovascular;  Laterality: N/A;  . JOINT REPLACEMENT    . TEE WITHOUT CARDIOVERSION N/A 10/08/2016   Procedure: TRANSESOPHAGEAL ECHOCARDIOGRAM (TEE);  Surgeon: Teodoro Spray, MD;  Location: [facility] ORS;  Service: Cardiovascular;  Laterality: N/A;  . TEE WITHOUT CARDIOVERSION N/A 11/23/2016   Procedure: TRANSESOPHAGEAL ECHOCARDIOGRAM (TEE);  Surgeon: Isaias Cowman, MD;  Location: [facility] ORS;  Service: Cardiovascular;  Laterality: N/A;       [facility] PT Assessment - 08/13/17 1221      Standardized Balance Assessment   Standardized Balance Assessment Berg Balance Test     Berg Balance Test   Sit to Stand Able to stand  independently using hands   Standing Unsupported Able to stand  safely 2 minutes   Sitting with Back Unsupported but Feet Supported on Floor or Stool Able to sit safely and securely 2 minutes   Stand to Sit Controls descent by using hands   Transfers Able to transfer safely, minor use of hands   Standing Unsupported with Eyes Closed Able to stand 10 seconds with supervision   Standing Ubsupported with Feet Together Able to place feet together independently and stand for 1 minute with supervision   From Standing, Reach Forward with Outstretched Arm Can reach confidently >25 cm (10")   From Standing Position, Pick up Object from Floor Able to pick up shoe, needs supervision   From Standing Position, Turn to Look Behind Over each Shoulder Needs supervision when turning   Turn 360 Degrees Able to turn 360 degrees safely but slowly   Standing Unsupported, Alternately Place Feet on Step/Stool Able to complete 4 steps without aid or supervision   Standing Unsupported, One Foot in Front Needs help to step but can hold 15 seconds   Standing on One Leg Able to lift leg independently and hold equal to or more than 3 seconds  standing on left leg 3 sec   Total Score 39     Dynamic Gait Index   Level Surface Mild Impairment   Change in Gait Speed Moderate Impairment   Gait with Horizontal Head Turns Mild Impairment   Gait with Vertical Head  Turns Mild Impairment   Gait and Pivot Turn Normal   Step Over Obstacle Normal   Step Around Obstacles Normal   Steps Moderate Impairment   Total Score 17      There were no vitals filed for this visit.      Subjective Assessment - 08/10/17 1039    Subjective Patient reports that he has been working on the semi-tandem stance and ball exercises. Patient reports that he has gotten better with doing activities with decresaed dizziness but reports he continues to have episodes of imbalance.    Pertinent History Pt reports that he had 2 hospital visits for endocarditis starting in December 2017 and then January 2018. He  suffered CVA in February 2018 and was admitted at [facility] 12/15/16. 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 He started having dizziness at that time. Symptoms have persisted since that time without improvement or worsening. He has a prolonged history of bilateral LE neuropathy over the course of multiple years. 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 He has a-fib, CAD, hyperlipidemia, and HTN. History of alcoholism in the past. 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 He reports recent hypotension and his BP medications have been modified. When asked about his symptoms pt reports  "It feels like I lose feeling in my head" Describes feeling "woozy." Denies vertigo. Reports feeling lightheaded like he might pass out. He had a syncopal episode 1.5 months ago. He has a visit with his cardiologist next week.   Limitations Walking   Diagnostic tests MRI brain 27/27/18: acute CVA superior L parietal and right external acpsul as well as chronic ischemia. CTA head/neck: multiple areas of severe stenosis   Patient Stated Goals Decrease symptoms and return to leisure activities   Currently in Pain? No/denies      Neuromuscular Re-education:  FUNCTIONAL OUTCOME MEASURES:  Performed DGI, ABC scale, DHI and Berg functional outcome measures. Discussed test results and compared to prior testing. Discussed progress towards goals and plan of care. Increased time spent on performing outcome testing and discussing above.    Results Comments  DHI 64/100 Moderate perception of handicap; in need of intervention  ABC Scale 48.8% Falls risk; in need of intervention  DGI 17/24 Falls risk; in need of intervention  Berg 39/56 Falls risk; in need of intervention           PT Education - 08/10/17 1041   Education Details:    Re-tested functional outcome measures and compared to prior testing, discussed goals and plan of care   Education provided Yes   Person(s) Educated Patient   Methods Explanation;Demonstration   Comprehension Verbalized understanding;Returned demonstration          PT Short Term Goals - 08/10/17 1042      PT SHORT TERM GOAL #1   Title Pt will be independent with HEP in order to improve strength and balance in order to decrease fall risk and improve function at home and with leisure activities   Time 6   Period Weeks   Status Partially Met           PT Long Term Goals - 08/13/17 1214      PT LONG TERM GOAL #1   Title Pt will improve DGI by at least 3 points in order  to demonstrate clinically significant improvement in balance and decreased risk for falls    Baseline 07/09/17: 10/24; 08/10/2017 scored 17/24   Time 6   Period Weeks   Status Achieved     PT LONG TERM GOAL #2   Title Pt will improve ABC by at least 13% in order to demonstrate clinically significant improvement in balance confidence.    Baseline 07/09/17: 40.9%; on 08/10/17 scored 48.8%   Time 6   Period Weeks   Status Achieved     PT LONG TERM GOAL #3   Title Pt will decrease DHI score by at least 18 points in order to demonstrate clinically significant reduction in disability    Baseline 07/09/17: 60/100; on 08/10/17 scored 64/100   Time 6   Period Weeks   Status On-going     PT LONG TERM GOAL #4   Title Pt will improve Berg Balance score by at least 10 points to demonstrate decreased risk of falling.     Baseline 07/13/17: 31/56; on 08/10/17 scored 39/56   Time 4   Period Weeks   Status Partially Met     PT LONG TERM GOAL #5   Title Patient will demonstrate reduced falls risk as evidenced by Dynamic Gait Index (DGI) >19/24.   Time 8   Period Weeks   Status New   Target Date 10/06/17     Additional Long Term Goals   Additional Long Term Goals Yes     PT LONG TERM GOAL #6   Title Patient will  reduce falls risk as indicated by Activities Specific Balance Confidence Scale (ABC) >67%.   Time 8   Period Weeks   Status New   Target Date 10/06/17               Plan - 08/13/17 1218    Clinical Impression Statement Performed functional outcome measure re-testing this date. Patient met long term goals # 1 and #2, partially met goal #4 and goal #3 is ongoing. Short term goal of independence with HEP is partially met secondary to patient continues to require cuing for technique with some of the exercises on the HEP. Patient demonstrated improvement of an 8 point increase on the Berg going from a 31/56 to 39/56. However, a score of less than 45/56 indicates falls risk. Patient improved from 40.9 to 48.8% on the ABC scale and from 10/24 to 17/24 on the DGI. Patient scored moderate perception of handicap on the [facility]. Although patient is demonstrating progress, he continues to be at risk for falls and would benefit from continued PT services to further address falls risk, improve balance and address remaining goals as set on plan of care. Added two additional goals for further improvements on the ABC scale and DGI.    Rehab Potential Fair   PT Frequency 2x / week   PT Duration 6 weeks   PT Treatment/Interventions ADLs/Self Care Home Management;Aquatic Therapy;Biofeedback;Canalith Repostioning;Cryotherapy;Electrical Stimulation;Iontophoresis 51m/ml Dexamethasone;Moist Heat;Traction;Ultrasound;Gait training;Stair training;Functional mobility training;Therapeutic activities;Therapeutic exercise;DME Instruction;Balance training;Neuromuscular re-education;Patient/family education;Manual techniques;Energy conservation;Vestibular;Visual/perceptual remediation/compensation   PT Next Visit Plan review HH PT balance exercises, continue to work on progressions of balance activities and vestibular exercises.   PT Home Exercise Plan VOR x 1 horizontal in sitting with back support 1 min x 3, 4x/day; Sit<>stand  with use of BUE, semitandem balance with horizontal head turns;   Consulted and Agree with Plan of Care Patient      Patient will benefit from skilled therapeutic intervention in order to improve  the following deficits and impairments:  Abnormal gait, Decreased balance, Decreased safety awareness, Decreased strength, Dizziness  Visit Diagnosis: Dizziness and giddiness  Repeated falls  Unsteadiness on feet     Problem List Patient Active Problem List   Diagnosis Date Noted  . TIA (transient ischemic attack) 12/15/2016  . CVA (cerebral vascular accident) ([place]) 12/15/2016  . Septic shock ([place]) 11/19/2016  . Pressure injury of skin 10/03/2016  . Sepsis ([place]) 10/02/2016   Lady Deutscher PT, DPT [phone] Lady Deutscher 08/10/2017, 10:42 AM  [place] MAIN [facility] SERVICES [address] [place], [place], [address] Phone: [phone]   Fax:  [phone]  Name: [name] PIENTA MRN: [id number] Date of Birth: [birth year]

## 2017-08-17 ENCOUNTER — Ambulatory Visit: Payer: PPO | Admitting: Physical Therapy

## 2017-08-20 ENCOUNTER — Ambulatory Visit: Payer: PPO | Attending: Neurology

## 2017-08-20 VITALS — BP 138/70 | HR 66

## 2017-08-20 DIAGNOSIS — R2681 Unsteadiness on feet: Secondary | ICD-10-CM | POA: Diagnosis not present

## 2017-08-20 DIAGNOSIS — R296 Repeated falls: Secondary | ICD-10-CM | POA: Diagnosis not present

## 2017-08-20 DIAGNOSIS — R42 Dizziness and giddiness: Secondary | ICD-10-CM | POA: Insufficient documentation

## 2017-08-20 DIAGNOSIS — G459 Transient cerebral ischemic attack, unspecified: Secondary | ICD-10-CM | POA: Insufficient documentation

## 2017-08-20 NOTE — Therapy (Signed)
[place] MAIN [facility] SERVICES [address] [place], [place], [address] Phone: [phone]   Fax:  [phone]  Physical Therapy Treatment  Patient Details  Name: [name] MRN: [id number] Date of Birth: 01/04/1946 Referring Provider: Dr. Manuella Ghazi  Encounter Date: 2017/09/01      PT End of Session - September 01, 2017 1007    Visit Number 6   Number of Visits 13   Date for PT Re-Evaluation 09-01-2017   Authorization Type g codes 6/10   PT Start Time 1010   PT Stop Time 1055   PT Time Calculation (min) 45 min   Equipment Utilized During Treatment Gait belt   Activity Tolerance Patient tolerated treatment well   Behavior During Therapy [facility] for tasks assessed/performed      Past Medical History:  Diagnosis Date  . Chronic pain   . Coronary artery disease   . Diabetes mellitus without complication ([place])   . DJD (degenerative joint disease)   . GERD (gastroesophageal reflux disease)   . Hyperlipemia   . Hypertension   . Insomnia   . Neuropathy     Past Surgical History:  Procedure Laterality Date  . BACK SURGERY    . CARDIAC SURGERY    . CARDIOVERSION N/A 05/04/2017   Procedure: CARDIOVERSION;  Surgeon: Corey Skains, MD;  Location: [facility] ORS;  Service: Cardiovascular;  Laterality: N/A;  . JOINT REPLACEMENT    . TEE WITHOUT CARDIOVERSION N/A 10/08/2016   Procedure: TRANSESOPHAGEAL ECHOCARDIOGRAM (TEE);  Surgeon: Teodoro Spray, MD;  Location: [facility] ORS;  Service: Cardiovascular;  Laterality: N/A;  . TEE WITHOUT CARDIOVERSION N/A 11/23/2016   Procedure: TRANSESOPHAGEAL ECHOCARDIOGRAM (TEE);  Surgeon: Isaias Cowman, MD;  Location: [facility] ORS;  Service: Cardiovascular;  Laterality: N/A;    Vitals:   2017-09-01 1012  BP: 138/70  Pulse: 66  SpO2: 98%        Subjective Assessment - 09/01/17 1006    Subjective Pt reports that he has been having a rough week with respect to his balance. He has been having a lot of staggering recently.  He is performing his HEP 2-3x/daily. No specific questions or concerns currently.    Pertinent History Pt reports that he had 2 hospital visits for endocarditis starting in December 2017 and then January 2018. 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 He started having dizziness at that time. Symptoms have persisted since that time without improvement or worsening. He has a prolonged history of bilateral LE neuropathy over the course of multiple years. 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 He has a-fib, CAD, hyperlipidemia, and HTN. History of alcoholism in the past. 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 He reports recent hypotension and his BP medications have been modified. When asked about his symptoms pt reports  "It feels like I lose feeling in my head" Describes feeling "woozy." Denies vertigo. Reports feeling lightheaded like he might pass out. He had a syncopal episode 1.5 months ago. He has a visit with his cardiologist next week.   Limitations Walking   Diagnostic tests MRI brain 27/27/18: acute CVA superior L parietal and right external acpsul as well as chronic ischemia. CTA head/neck: multiple areas of severe stenosis  Patient Stated Goals Decrease symptoms and return to leisure activities   Currently in Pain? No/denies          TREATMENT  Neuromuscular Re-education:  NuStep L3 x 5 minutes for warm-up (unbilled);   VOR x 1 exercise: Patient performed VOR X 1 horizontal in standing with feet together 3 reps of 1 minute with conflicting background with verbal cues for technique to not move shoulders side to side only head. Patient reports mild dizziness symptoms.    Semi-tandem stance Progressions: On firm surface, patient  performed semi-tandem stance progressions with and without horizontal and vertical head turns with CGA with verbal cues for technique with foot placement. Cues to vary width of stance for appropriate challenge. Pt reports feeling like he is doing more poorly this week compared to prior sessions. Performed multiple 1 minute bouts.   Ambulation with head turns:  Patient performed 13' trials of forwards and retro ambulation with horizontal and vertical head turns with CGA with use of SPC.  Patient demonstrates increased difficulty with retro ambulation with and without head turns horizontally with patient taking smaller steps with decreased step height and narrow base of support. Patient with several small losses of balance with retro ambulation requiring CGA to Min A to correct. Brief standing supported rest breaks between bouts.  Diona Foley toss to self:  Patient performed static sittingwhile tossing ball to self horizontallyand then vertically while tracking ball with head and eyes x 1 minute each. Patient denies dizziness with this activity.  Ball toss over shoulder: Patient performed in static standing pass ball over one shoulder with return catch over opposite shoulderwith CGA in // bars following with head and eyes. Varied height between chest and waist but unable to go overhead due to L shoulder deficits. Foot position varied between feet apart and together.   Airex Cone Taps Performed airex toe taps on cones in 1 and 2 cone sequencing as called out by therapist. Pt requiring frequent finger touching and/or minA+1 from therapist to maintain balance. He intermittent knocks over cones as wells.                             PT Education - 08/20/17 1006    Education provided Yes   Education Details exercise form/technique   Person(s) Educated Patient   Methods Explanation   Comprehension Verbalized understanding          PT Short Term Goals - 08/10/17 1042      PT  SHORT TERM GOAL #1   Title Pt will be independent with HEP in order to improve strength and balance in order to decrease fall risk and improve function at home and with leisure activities   Time 6   Period Weeks   Status Partially Met           PT Long Term Goals - 08/13/17 1214      PT LONG TERM GOAL #1   Title Pt will improve DGI by at least 3 points in order to demonstrate clinically significant improvement in balance and decreased risk for falls    Baseline 07/09/17: 10/24; 08/10/2017 scored 17/24   Time 6   Period Weeks   Status Achieved     PT LONG TERM GOAL #2   Title Pt will improve ABC by at least 13% in order to demonstrate clinically significant improvement in balance confidence.    Baseline 07/09/17: 40.9%; on 08/10/17 scored 48.8%   Time  6   Period Weeks   Status Achieved     PT LONG TERM GOAL #3   Title Pt will decrease DHI score by at least 18 points in order to demonstrate clinically significant reduction in disability    Baseline 07/09/17: 60/100; on 08/10/17 scored 64/100   Time 6   Period Weeks   Status On-going     PT LONG TERM GOAL #4   Title Pt will improve Berg Balance score by at least 10 points to demonstrate decreased risk of falling.     Baseline 07/13/17: 31/56; on 08/10/17 scored 39/56   Time 4   Period Weeks   Status Partially Met     PT LONG TERM GOAL #5   Title Patient will demonstrate reduced falls risk as evidenced by Dynamic Gait Index (DGI) >19/24.   Time 8   Period Weeks   Status New   Target Date 10/06/17     Additional Long Term Goals   Additional Long Term Goals Yes     PT LONG TERM GOAL #6   Title Patient will reduce falls risk as indicated by Activities Specific Balance Confidence Scale (ABC) >67%.   Time 8   Period Weeks   Status New   Target Date 10/06/17               Plan - 08/20/17 1007    Clinical Impression Statement Pt demonstrates good progress with therapy but he continues to struggle with frequent  staggering and LOB. He is particularly challenged in semitandem stance as well as balance on unstable surfaces. He requires intermittent standing rest breaks throughout session due to imbalance and fatigue. Pt encouraged to continue HEP and follow-up as scheduled.    Rehab Potential Fair   PT Frequency 2x / week   PT Duration 6 weeks   PT Treatment/Interventions ADLs/Self Care Home Management;Aquatic Therapy;Biofeedback;Canalith Repostioning;Cryotherapy;Electrical Stimulation;Iontophoresis 73m/ml Dexamethasone;Moist Heat;Traction;Ultrasound;Gait training;Stair training;Functional mobility training;Therapeutic activities;Therapeutic exercise;DME Instruction;Balance training;Neuromuscular re-education;Patient/family education;Manual techniques;Energy conservation;Vestibular;Visual/perceptual remediation/compensation   PT Next Visit Plan review HH PT balance exercises, continue to work on progressions of balance activities and vestibular exercises.   PT Home Exercise Plan VOR x 1 horizontal in sitting with back support 1 min x 3, 4x/day; Sit<>stand with use of BUE, semitandem balance with horizontal head turns;   Consulted and Agree with Plan of Care Patient      Patient will benefit from skilled therapeutic intervention in order to improve the following deficits and impairments:  Abnormal gait, Decreased balance, Decreased safety awareness, Decreased strength, Dizziness  Visit Diagnosis: Dizziness and giddiness  Repeated falls  Unsteadiness on feet     Problem List Patient Active Problem List   Diagnosis Date Noted  . TIA (transient ischemic attack) 12/15/2016  . [place] ([place]re[place]) ([place] 12/15/2016  . Septic shock ([place] 11/19/2016  . Pressure injury of skin 10/03/2016  . [place]epsis ([place] 10/02/2016    JPhillips GroutPT, DPT   Markeith Jue 08/20/2017, 11:07 AM  [place] [facility] [address]  [address]Phone: [phone]  Fax:  [phone] Name: GTYRAY PROCHMRN: [id number] of Birth: 901-17-1947

## 2017-08-27 ENCOUNTER — Other Ambulatory Visit: Payer: Self-pay

## 2017-08-27 ENCOUNTER — Ambulatory Visit: Payer: PPO

## 2017-08-27 VITALS — BP 145/80 | HR 65

## 2017-08-27 DIAGNOSIS — R42 Dizziness and giddiness: Secondary | ICD-10-CM | POA: Diagnosis not present

## 2017-08-27 DIAGNOSIS — R2681 Unsteadiness on feet: Secondary | ICD-10-CM

## 2017-08-27 NOTE — Therapy (Signed)
[place] MAIN [facility] SERVICES [address] [place], [place], [address] Phone: [phone]   Fax:  [phone]  Physical Therapy Treatment  Patient Details  Name: [name] MRN: [id number] Date of Birth: Nov 24, 1945 Referring Provider: Dr. Manuella Ghazi   Encounter Date: 08/27/2017  PT End of Session - 08/29/17 1400    Visit Number  7    Number of Visits  13    Date for PT Re-Evaluation  10/08/17    Authorization Type  g codes 7/10, next visit will be 1/10    PT Start Time  0958    PT Stop Time  1045    PT Time Calculation (min)  47 min    Equipment Utilized During Treatment  Gait belt    Activity Tolerance  Patient tolerated treatment well    Behavior During Therapy  [facility] for tasks assessed/performed       Past Medical History:  Diagnosis Date  . Chronic pain   . Coronary artery disease   . Diabetes mellitus without complication ([place])   . DJD (degenerative joint disease)   . GERD (gastroesophageal reflux disease)   . Hyperlipemia   . Hypertension   . Insomnia   . Neuropathy     Past Surgical History:  Procedure Laterality Date  . BACK SURGERY    . CARDIAC SURGERY    . JOINT REPLACEMENT      Vitals:   08/27/17 1000  BP: (!) 145/80  Pulse: 65  SpO2: 98%    Subjective Assessment - 08/29/17 1359    Subjective  Pt states that he is having good days and bad days but no specific questions or concerns. He reports that today is a "medium day". He is completing his HEP regularly. Denies pain.     Pertinent History  Pt reports that he had 2 hospital visits for endocarditis starting in December 2017 and then January 2018. 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 He started having dizziness at that time. Symptoms have persisted since that time without improvement or worsening. He has a prolonged  history of bilateral LE neuropathy over the course of multiple years. 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 He has a-fib, CAD, hyperlipidemia, and HTN. History of alcoholism in the past. 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 He reports recent hypotension and his BP medications have been modified. When asked about his symptoms pt reports  "It feels like I lose feeling in my head" Describes feeling "woozy." Denies vertigo. Reports feeling lightheaded like he might pass out. He had a syncopal episode 1.5 months ago. He has a visit with his cardiologist next week.    Limitations  Walking    Diagnostic tests  MRI brain 27/27/18: acute CVA superior L parietal and right external capsule as well as chronic ischemia. CTA head/neck: multiple areas of severe stenosis    Patient Stated Goals  Decrease symptoms and return to leisure activities    Currently in Pain?  No/denies         TREATMENT   Neuromuscular Re-education: Updated goals with patient and discussed plan of care/recertification;  NuStep L3 x 5 minutes for warm-up during history (4 minutes unbilled);  VOR x 1  exercise Patient performed VOR X 1 horizontal with forward/retro ambulation 35' x 3 with conflicting backgrouns, verbal cues for technique to move head side to side, pt reports 6/10 dizziness with retro ambulation. Pt also with notable instability.   Semi-tandem stance Progressions On firm surface, patient performed semi-tandem stance progressions with and without horizontal and vertical head turns with CGA with verbal cues for technique with foot placement.   Airex balance beam On Airex balance beam, performed sideways stepping left/right with faded UEs support progressing to no UEs support 5' times multiple reps.  On Airex balance beam, performed sideways stepping with horizontal head turns 5' times 4 reps with  faded UEs support progressing to intermittent UEs support.  Ball tracking Patient performed static sittingwhile holding ball while moving ball horizontallyand then verticallywhile tracking ball with head and eyes with verbal cues to turn the head. Performed in front of a conflicting background and encouraged pt to do the same at home.                      PT Education - 08/29/17 1400    Education provided  Yes    Education Details  progress and plan of care, exercise form/techniuqe    Person(s) Educated  Patient    Methods  Explanation    Comprehension  Verbalized understanding       PT Short Term Goals - 08/27/17 1007      PT SHORT TERM GOAL #1   Title  Pt will be independent with HEP in order to improve strength and balance in order to decrease fall risk and improve function at home and with leisure activities    Time  6    Period  Weeks    Status  Achieved        PT Long Term Goals - 08/27/17 1007      PT LONG TERM GOAL #1   Title  Pt will improve DGI by at least 3 points in order to demonstrate clinically significant improvement in balance and decreased risk for falls     Baseline  07/09/17: 10/24; 08/10/2017 scored 17/24    Time  6    Period  Weeks    Status  Achieved      PT LONG TERM GOAL #2   Title  Pt will improve ABC by at least 13% in order to demonstrate clinically significant improvement in balance confidence.     Baseline  07/09/17: 40.9%; on 08/10/17 scored 48.8%    Time  6    Period  Weeks    Status  Partially Met      PT LONG TERM GOAL #3   Title  Pt will decrease DHI score by at least 18 points in order to demonstrate clinically significant reduction in disability     Baseline  07/09/17: 60/100; on 08/10/17 scored 64/100    Time  6    Period  Weeks    Status  Partially Met      PT LONG TERM GOAL #4   Title  Pt will improve Berg Balance score by at least 10 points to demonstrate decreased risk of falling.      Baseline  07/13/17:  31/56; on 08/10/17 scored 39/56    Time  4    Period  Weeks    Status  Partially Met      PT LONG TERM GOAL #5   Title  Patient will demonstrate reduced falls risk as evidenced by Dynamic Gait  Index (DGI) >19/24.    Time  8    Period  Weeks    Status  On-going      PT LONG TERM GOAL #6   Title  Patient will reduce falls risk as indicated by Activities Specific Balance Confidence Scale (ABC) >67%.    Time  8    Period  Weeks    Status  On-going            Plan - 08/29/17 1400    Clinical Impression Statement  Pt demonstrates good progress with therapy and reports significant improvement since first starting therapy. Outcome measures not repeated today as they were just performed on 08/10/17 and not enough time has elapsed to see change. When they were last performed pt showed improvement in his DGI from 10/24 to 17/24 and improvement in his BERG from 31/56 to 39/56. His ABC improved from 40.9% to 48.8% demonstrating increase in his overall balance confidence. Pt is consistent with his HEP and shows good motivation with therapy. He will benefit from continuation of his therapy to continue to better his balance and decrease his dizziness in order to decrease risk of falls and improve his function at home.    History and Personal Factors relevant to plan of care:  greater than 3 personal factors/comorbidities, more than 4 body systems    Clinical Presentation  Unstable    Clinical Presentation due to:  symptoms are highly variable from one day to the next, with position changes, and with activities    Rehab Potential  Fair    PT Frequency  2x / week    PT Duration  6 weeks    PT Treatment/Interventions  ADLs/Self Care Home Management;Aquatic Therapy;Biofeedback;Canalith Repostioning;Cryotherapy;Electrical Stimulation;Iontophoresis '4mg'$ /ml Dexamethasone;Moist Heat;Traction;Ultrasound;Gait training;Stair training;Functional mobility training;Therapeutic activities;Therapeutic exercise;DME  Instruction;Balance training;Neuromuscular re-education;Patient/family education;Manual techniques;Energy conservation;Vestibular;Visual/perceptual remediation/compensation    PT Next Visit Plan  continue to work on progressions of balance activities and vestibular exercises.    PT Home Exercise Plan  VOR x 1 horizontal in sitting with back support 1 min x 3, 4x/day; Sit<>stand with use of BUE, semitandem balance with horizontal head turns, sitting ball pass with head/eye follow with conflicting background    Consulted and Agree with Plan of Care  Patient       Patient will benefit from skilled therapeutic intervention in order to improve the following deficits and impairments:  Abnormal gait, Decreased balance, Decreased safety awareness, Decreased strength, Dizziness  Visit Diagnosis: Dizziness and giddiness - Plan: PT plan of care cert/re-cert  Unsteadiness on feet - Plan: PT plan of care cert/re-cert   G-Codes - 71/06/26 1403    Functional Assessment Tool Used (Outpatient Only)  ABC, DHI, DGI, clinical judgement    Functional Limitation  Mobility: Walking and moving around    Mobility: Walking and Moving Around Current Status [phone])  At least 40 percent but less than 60 percent impaired, limited or restricted    Mobility: Walking and Moving Around Goal Status [phone])  At least 20 percent but less than 40 percent impaired, limited or restricted       Problem List Patient Active Problem List   Diagnosis Date Noted  . TIA (transient ischemic attack) 12/15/2016  . CVA (cerebral vascular accident) ([place]) 12/15/2016  . Septic shock ([place]) 11/19/2016  . Pressure injury of skin 10/03/2016  . Sepsis ([place]) 10/02/2016   Phillips Grout PT, DPT   Raymon Schlarb 08/29/2017, 2:14 PM  [place] MAIN RE[facility] SERVICES [address]  [place], [place], [address] Phone: [phone]   Fax:  [phone]  Name: RESHAUN BRISENO MRN: [id number] Date of Birth:  May 12, 1946

## 2017-08-30 DIAGNOSIS — Z7901 Long term (current) use of anticoagulants: Secondary | ICD-10-CM | POA: Diagnosis not present

## 2017-08-30 DIAGNOSIS — Z5181 Encounter for therapeutic drug level monitoring: Secondary | ICD-10-CM | POA: Diagnosis not present

## 2017-08-31 ENCOUNTER — Ambulatory Visit: Payer: PPO | Admitting: Physical Therapy

## 2017-08-31 ENCOUNTER — Other Ambulatory Visit: Payer: Self-pay

## 2017-08-31 ENCOUNTER — Encounter: Payer: Self-pay | Admitting: Physical Therapy

## 2017-08-31 DIAGNOSIS — R296 Repeated falls: Secondary | ICD-10-CM

## 2017-08-31 DIAGNOSIS — R42 Dizziness and giddiness: Secondary | ICD-10-CM | POA: Diagnosis not present

## 2017-08-31 DIAGNOSIS — R2681 Unsteadiness on feet: Secondary | ICD-10-CM

## 2017-08-31 NOTE — Therapy (Signed)
[place] MAIN [facility] SERVICES [address] [place], [place], [address] Phone: [phone]   Fax:  [phone]  Physical Therapy Treatment  Patient Details  Name: [name] MRN: [id number] Date of Birth: [birth year] Referring Provider: Dr. Manuella Ghazi   Encounter Date: 08/31/2017  PT End of Session - 08/31/17 1014    Visit Number  8    Number of Visits  13    Date for PT Re-Evaluation  10/08/17    Authorization Type  1/10    PT Start Time  1015    PT Stop Time  1105    PT Time Calculation (min)  50 min    Equipment Utilized During Treatment  Gait belt    Activity Tolerance  Patient tolerated treatment well    Behavior During Therapy  [facility] for tasks assessed/performed       Past Medical History:  Diagnosis Date  . Chronic pain   . Coronary artery disease   . Diabetes mellitus without complication ([place])   . DJD (degenerative joint disease)   . GERD (gastroesophageal reflux disease)   . Hyperlipemia   . Hypertension   . Insomnia   . Neuropathy     Past Surgical History:  Procedure Laterality Date  . BACK SURGERY    . CARDIAC SURGERY    . JOINT REPLACEMENT      There were no vitals filed for this visit.  Subjective Assessment - 08/31/17 1013    Subjective  Patient states that he slipped on a step yesterday while trying to manage his umbrella and navigating on the stairs. Patient states he did not hurt his head and has no injuries.     Pertinent History  Pt reports that he had 2 hospital visits for endocarditis starting in December 2017 and then January 2018. 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 He started having dizziness at that time. Symptoms have persisted since that time without improvement or worsening. He has a prolonged history of bilateral LE neuropathy over the course of multiple  years. 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 He has a-fib, CAD, hyperlipidemia, and HTN. History of alcoholism in the past. 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 He reports recent hypotension and his BP medications have been modified. When asked about his symptoms pt reports  "It feels like I lose feeling in my head" Describes feeling "woozy." Denies vertigo. Reports feeling lightheaded like he might pass out. He had a syncopal episode 1.5 months ago. He has a visit with his cardiologist next week.    Limitations  Walking    Diagnostic tests  MRI brain 27/27/18: acute CVA superior L parietal and right external capsule as well as chronic ischemia. CTA head/neck: multiple areas of severe stenosis    Patient Stated Goals  Decrease symptoms and return to leisure activities    Currently in Pain?  No/denies       Neuromuscular Re-education:  Airex Pad:  Patient performed standing on Airex pad with feet together while holding ball while moving ball horizontally and then vertically while tracking ball with head and eyes with verbal cues to turn the head. Patient performed standing on Airex pad with feet together while holding  ball while moving ball in circular pattern while tracking ball with head and eyes. Patient with increased imbalance with these activities requiring CGA to MIN A and patient reached a few times to touch // bars to regain balance.   Balance Pods: In // bars, performed walking on balance pods multiple reps with CGA.   Obstacle Course: Patient performed obstacle course without use of SPC but part of the course was set up in the // bars and part was adjacent to the // bars for safety so that the patient could reach for support on // bar if needed.  Patient performed obstacle course multiple reps which included the following activities: stepping up onto and over 5"  wooden step, stepping onto and off foam oval pads, stepping onto and off 1/2 foam roll with flat side down, stepping onto small wooden rockerboard oriented in the anterior/posterior position and stepping onto and over foam pads Airex pad. Patient required CGA to MIN A with obstacle course.  Patient was most challenged by trying to step onto and off the 1/2 foam roll and descending the 5" wooden step. Patient did need to adjust his gait and slow down to adjust to be able to step onto objects.     PT Education - 08/31/17 1014    Education provided  Yes    Education Details  plan of care, exercises    Person(s) Educated  Patient    Methods  Explanation;Verbal cues    Comprehension  Verbalized understanding;Returned demonstration       PT Short Term Goals - 08/27/17 1007      PT SHORT TERM GOAL #1   Title  Pt will be independent with HEP in order to improve strength and balance in order to decrease fall risk and improve function at home and with leisure activities    Time  6    Period  Weeks    Status  Achieved        PT Long Term Goals - 08/27/17 1007      PT LONG TERM GOAL #1   Title  Pt will improve DGI by at least 3 points in order to demonstrate clinically significant improvement in balance and decreased risk for falls     Baseline  07/09/17: 10/24; 08/10/2017 scored 17/24    Time  6    Period  Weeks    Status  Achieved      PT LONG TERM GOAL #2   Title  Pt will improve ABC by at least 13% in order to demonstrate clinically significant improvement in balance confidence.     Baseline  07/09/17: 40.9%; on 08/10/17 scored 48.8%    Time  6    Period  Weeks    Status  Partially Met      PT LONG TERM GOAL #3   Title  Pt will decrease DHI score by at least 18 points in order to demonstrate clinically significant reduction in disability     Baseline  07/09/17: 60/100; on 08/10/17 scored 64/100    Time  6    Period  Weeks    Status  Partially Met      PT LONG TERM GOAL #4    Title  Pt will improve Berg Balance score by at least 10 points to demonstrate decreased risk of falling.      Baseline  07/13/17: 31/56; on 08/10/17 scored 39/56    Time  4    Period  Weeks    Status  Partially Met      PT LONG TERM GOAL #5   Title  Patient will demonstrate reduced falls risk as evidenced by Dynamic Gait Index (DGI) >19/24.    Time  8    Period  Weeks    Status  On-going      PT LONG TERM GOAL #6   Title  Patient will reduce falls risk as indicated by Activities Specific Balance Confidence Scale (ABC) >67%.    Time  8    Period  Weeks    Status  On-going            Plan - 08/31/17 1014    Clinical Impression Statement  Patient able to progress to feet together and closer to heel toe position with feet slightly offest while standing on Airex pad with hands holding object while doing head turning this date. Patient continues to be challenged by high level standing balance activities and an obstacle course this date with difficult challenges such as stepping onto and over 1/2 foam roll, small wooden rockerboard, stepping onto and off 5" wooden step and a variety of different firmness of foam pads. Patient improved with practice and was better able to clear his feet to step onto and over objects but would benefit from continued practice with this activity. Patient encouraged to follow-up as indicated to further address balance deficits and symptoms.     Rehab Potential  Fair    PT Frequency  2x / week    PT Duration  6 weeks    PT Treatment/Interventions  ADLs/Self Care Home Management;Aquatic Therapy;Biofeedback;Canalith Repostioning;Cryotherapy;Electrical Stimulation;Iontophoresis 51m/ml Dexamethasone;Moist Heat;Traction;Ultrasound;Gait training;Stair training;Functional mobility training;Therapeutic activities;Therapeutic exercise;DME Instruction;Balance training;Neuromuscular re-education;Patient/family education;Manual techniques;Energy  conservation;Vestibular;Visual/perceptual remediation/compensation    PT Next Visit Plan  continue to work on progressions of balance activities and vestibular exercises. obstacle course, stepping onto and over variety of different heights and compliant surfaces.     PT Home Exercise Plan  VOR x 1 horizontal in sitting with back support 1 min x 3, 4x/day; Sit<>stand with use of BUE, semitandem balance with horizontal head turns, sitting ball pass with head/eye follow with conflicting background    Consulted and Agree with Plan of Care  Patient       Patient will benefit from skilled therapeutic intervention in order to improve the following deficits and impairments:  Abnormal gait, Decreased balance, Decreased safety awareness, Decreased strength, Dizziness  Visit Diagnosis: Dizziness and giddiness  Unsteadiness on feet  Repeated falls     Problem List Patient Active Problem List   Diagnosis Date Noted  . TIA (transient ischemic attack) 12/15/2016  . [place]re[place]) ([place] 12/15[place]  . Septic shock ([place] 11/19/2016  . Pressure injury of skin 10/03/2016  . Sepsis ([place] 10/02/2016   DLady DeutscherPT, DPT #[id number] Deutscher11/13/2018, 3:15 PM  [place] [facility] [address]RHousatonic [place] [address]Phone: [phone]  Fax:  [phone] Name: [name] BEITLERMRN: [id number] of Birth: 911/04/1946

## 2017-09-06 DIAGNOSIS — L97521 Non-pressure chronic ulcer of other part of left foot limited to breakdown of skin: Secondary | ICD-10-CM | POA: Diagnosis not present

## 2017-09-06 DIAGNOSIS — E1140 Type 2 diabetes mellitus with diabetic neuropathy, unspecified: Secondary | ICD-10-CM | POA: Diagnosis not present

## 2017-09-07 ENCOUNTER — Ambulatory Visit: Payer: PPO | Admitting: Physical Therapy

## 2017-09-07 DIAGNOSIS — R42 Dizziness and giddiness: Secondary | ICD-10-CM

## 2017-09-07 DIAGNOSIS — R2681 Unsteadiness on feet: Secondary | ICD-10-CM

## 2017-09-07 DIAGNOSIS — R296 Repeated falls: Secondary | ICD-10-CM

## 2017-09-07 NOTE — Therapy (Addendum)
[place] MAIN [facility] SERVICES [address] [place], [place], [address] Phone: [phone]   Fax:  [phone]  Patient Details  Name: [name] MRN: [id number] Date of Birth: [birth year] Referring Provider:  Vladimir Crofts, MD  Encounter Date: 09/07/2017   Patient arrives to clinic, but reports that he went to the doctor and found out he had an ulcer on his foot that was not healing and was debrided in the doctor's office. Patient has been trying to offload his heel to relieve pressure on his foot. Due to his recent heel debridement will hold PT services this week as patient performs balance activities in standing during therapy sessions. Patient in agreement to hold therapy this week.   Lady Deutscher PT, DPT [phone] Lady Deutscher 09/17/2017, 8:10 AM  [place] MAIN [facility] SERVICES [address] [place], [place], [address] Phone: [phone]   Fax:  [phone]

## 2017-09-14 ENCOUNTER — Ambulatory Visit: Payer: PPO | Admitting: Physical Therapy

## 2017-09-14 DIAGNOSIS — M5136 Other intervertebral disc degeneration, lumbar region: Secondary | ICD-10-CM | POA: Diagnosis not present

## 2017-09-14 DIAGNOSIS — M545 Low back pain: Secondary | ICD-10-CM | POA: Diagnosis not present

## 2017-09-14 DIAGNOSIS — G894 Chronic pain syndrome: Secondary | ICD-10-CM | POA: Diagnosis not present

## 2017-09-17 ENCOUNTER — Encounter: Payer: Self-pay | Admitting: Physical Therapy

## 2017-09-17 ENCOUNTER — Ambulatory Visit: Payer: PPO | Admitting: Physical Therapy

## 2017-09-17 VITALS — BP 149/69

## 2017-09-17 DIAGNOSIS — R2681 Unsteadiness on feet: Secondary | ICD-10-CM

## 2017-09-17 DIAGNOSIS — R42 Dizziness and giddiness: Secondary | ICD-10-CM

## 2017-09-17 DIAGNOSIS — R296 Repeated falls: Secondary | ICD-10-CM

## 2017-09-17 NOTE — Therapy (Signed)
[place] MAIN [facility] [name] [address] [place], [place], [address] Phone: [phone]   Fax:  [phone]  Physical Therapy Treatment  Patient Details  Name: [name] MRN: [id number] Date of Birth: [birth year] Referring Provider: Dr. Manuella Ghazi   Encounter Date: 09/17/2017  PT End of Session - 09/17/17 0857    Visit Number  9    Number of Visits  13    Date for PT Re-Evaluation  10/08/17    Authorization Type  9/10    PT Start Time  0857    PT Stop Time  0952    PT Time Calculation (min)  55 min    Equipment Utilized During Treatment  Gait belt    Activity Tolerance  Patient tolerated treatment well    Behavior During Therapy  [facility] for tasks assessed/performed       Past Medical History:  Diagnosis Date  . Chronic pain   . Coronary artery disease   . Diabetes mellitus without complication ([place])   . DJD (degenerative joint disease)   . GERD (gastroesophageal reflux disease)   . Hyperlipemia   . Hypertension   . Insomnia   . Neuropathy     Past Surgical History:  Procedure Laterality Date  . BACK SURGERY    . CARDIAC SURGERY    . CARDIOVERSION N/A 05/04/2017   Procedure: CARDIOVERSION;  Surgeon: Corey Skains, MD;  Location: [facility] ORS;  Service: Cardiovascular;  Laterality: N/A;  . JOINT REPLACEMENT    . TEE WITHOUT CARDIOVERSION N/A 10/08/2016   Procedure: TRANSESOPHAGEAL ECHOCARDIOGRAM (TEE);  Surgeon: Teodoro Spray, MD;  Location: [facility] ORS;  Service: Cardiovascular;  Laterality: N/A;  . TEE WITHOUT CARDIOVERSION N/A 11/23/2016   Procedure: TRANSESOPHAGEAL ECHOCARDIOGRAM (TEE);  Surgeon: Isaias Cowman, MD;  Location: [facility] ORS;  Service: Cardiovascular;  Laterality: N/A;    Vitals:   09/17/17 1524 09/17/17 1525  BP: (!) 158/70 (!) 149/69    Subjective Assessment - 09/17/17 0857    Subjective  Patient states that he has not had any falls this past week. Patient states that he returns for follow-up appointment  for his heel ulcer either next Monday of the following Monday. Patient states he is not sure how well the ulcer is heeling. Patient states that he notices that he does have difficulty whenever he has to step from a firm surface to a compliant surface. For example, stepping off the driveway or sidewalk onto the grass. Patient states he has been wearing slippers in the house to protect his feet and is not walking barefoot. Patient states that he has been inspecting his feet.     Pertinent History  Pt reports that he had 2 hospital visits for endocarditis starting in December 2017 and then January 2018. 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 He started having dizziness at that time. Symptoms have persisted since that time without improvement or worsening. He has a prolonged history of bilateral LE neuropathy over the course of multiple years. 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 He has a-fib, CAD, hyperlipidemia, and HTN. History of alcoholism in the past. 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 He reports recent hypotension and his BP medications have been  modified. When asked about his symptoms pt reports  "It feels like I lose feeling in my head" Describes feeling "woozy." Denies vertigo. Reports feeling lightheaded like he might pass out. He had a syncopal episode 1.5 months ago. He has a visit with his cardiologist next week.    Limitations  Walking    Diagnostic tests  MRI brain 27/27/18: acute CVA superior L parietal and right external capsule as well as chronic ischemia. CTA head/neck: multiple areas of severe stenosis    Patient Stated Goals  Decrease symptoms and return to leisure activities        Neuromuscular Re-education:  Obstacle  Course: In and beside // bars, patient performed multiple different obstacle courses multiple reps which included the following activities: navigating over balance pods, foam bricks, stepping over varying size objects and stepping onto and over foam pads and Airex pad as well as 6" wooden step. Patient improved with practice navigating on the course and was most challenged by stepping onto compliant surfaces. Patient did need to adjust his gait and slow down to adjust to be able to step over various objects.  Patient did not use SPC during this activity but did reach out to touch // bar multiple times due to loss of balance and required CGA.  Cone tapping: On Airex pad, patient performed alternate foot tapping to cones in series of one and two targets as called out by therapist. Patient reached to touch // bars several times during activity secondary to loss of balance.  Patient requiring assistance ranging from CGA to Mod A at times due to loss of balance.  Patient has increased difficulty with single stance on left leg while reaching to touch cones with right leg.   Airex pad:  Worked on feet in semi-tandem stance position with rear foot standing on firm surface and front foot standing on blue Airex pad with equal weight between feet while holding PVC bar with UEs and performing raising the bar upwards and downwards 10 reps. Then, repeated activity with patient forward shifting onto forward foot while punching PVC bar forward and then pulling PVC bar back towards chest while leaning body back putting weight on the rear foot (forward/backward lunges with bar) 10 reps.    Repeated activity with feet in semi-tandem stance position with rear foot standing on thinner oval green foam pad and front foot standing on blue Airex pad with equal weight between feet while holding PVC bar with UEs and performing raising the bar upwards and downwards 10 reps. Then, repeated activity with patient forward shifting onto  forward foot while punching PVC bar forward and then pulling PVC bar back towards chest while leaning body back putting weight on rear foot (forward/backward lunges with bar) 10 reps.   Repeated activity with feet in semi-tandem stance position with rear foot standing on thinner profile purple foam pad and front foot standing on blue Airex pad with equal weight between feet while holding PVC bar with UEs and performing raising the bar upwards and downwards 10 reps. Then, repeated activity with patient forward shifting onto forward foot while punching PVC bar forward and then pulling PVC bar back towards chest while leaning body back putting weight on rear foot (forward/backward lunges with bar) 10 reps.  Patient required CGA for all of the above activities while in // bars.  Patient reports increased difficulty with maintaining balance with weight shifting activity.   Discussed precautions with activities secondary to impaired bilateral sensation in LEs. Recommended that  he not climb ladders as patient reports he is not able to feel the rungs of the ladder.   PT Education - 09/17/17 0857    Education provided  Yes    Person(s) Educated  Patient    Methods  Explanation    Comprehension  Verbalized understanding       PT Short Term Goals - 08/27/17 1007      PT SHORT TERM GOAL #1   Title  Pt will be independent with HEP in order to improve strength and balance in order to decrease fall risk and improve function at home and with leisure activities    Time  6    Period  Weeks    Status  Achieved        PT Long Term Goals - 08/27/17 1007      PT LONG TERM GOAL #1   Title  Pt will improve DGI by at least 3 points in order to demonstrate clinically significant improvement in balance and decreased risk for falls     Baseline  07/09/17: 10/24; 08/10/2017 scored 17/24    Time  6    Period  Weeks    Status  Achieved      PT LONG TERM GOAL #2   Title  Pt will improve ABC by at least 13% in  order to demonstrate clinically significant improvement in balance confidence.     Baseline  07/09/17: 40.9%; on 08/10/17 scored 48.8%    Time  6    Period  Weeks    Status  Partially Met      PT LONG TERM GOAL #3   Title  Pt will decrease DHI score by at least 18 points in order to demonstrate clinically significant reduction in disability     Baseline  07/09/17: 60/100; on 08/10/17 scored 64/100    Time  6    Period  Weeks    Status  Partially Met      PT LONG TERM GOAL #4   Title  Pt will improve Berg Balance score by at least 10 points to demonstrate decreased risk of falling.      Baseline  07/13/17: 31/56; on 08/10/17 scored 39/56    Time  4    Period  Weeks    Status  Partially Met      PT LONG TERM GOAL #5   Title  Patient will demonstrate reduced falls risk as evidenced by Dynamic Gait Index (DGI) >19/24.    Time  8    Period  Weeks    Status  On-going      PT LONG TERM GOAL #6   Title  Patient will reduce falls risk as indicated by Activities Specific Balance Confidence Scale (ABC) >67%.    Time  8    Period  Weeks    Status  On-going            Plan - 09/17/17 0857    Clinical Impression Statement  Patient reports compliance with HEP. Patient continues to have difficulty with compliant surfaces and was challenged by activites with patient standing in semi-tandem stance position with each foot on a different complian surface while doing weight shifting activities this date.  Repeated obstacle course this date and patient demonstrated improvements in being able to step over and clear objects which patient reports he has been practing at home. Patient with significant sensation deficits in bilateral lower legs that impacts his balance. Patient would benefit from continued PT [name] to  further address functional deficits and to try to reduce his falls risk.     Rehab Potential  Fair    PT Frequency  2x / week    PT Duration  6 weeks    PT Treatment/Interventions   ADLs/Self Care Home Management;Aquatic Therapy;Biofeedback;Canalith Repostioning;Cryotherapy;Electrical Stimulation;Iontophoresis 62m/ml Dexamethasone;Moist Heat;Traction;Ultrasound;Gait training;Stair training;Functional mobility training;Therapeutic activities;Therapeutic exercise;DME Instruction;Balance training;Neuromuscular re-education;Patient/family education;Manual techniques;Energy conservation;Vestibular;Visual/perceptual remediation/compensation    PT Next Visit Plan  cone tapping, continue to work on progressions of balance activities and vestibular exercises. obstacle course, stepping onto and over variety of different heights and compliant surfaces.     PT Home Exercise Plan  VOR x 1 horizontal in sitting with back support 1 min x 3, 4x/day; Sit<>stand with use of BUE, semitandem balance with horizontal head turns, sitting ball pass with head/eye follow with conflicting background    Consulted and Agree with Plan of Care  Patient       Patient will benefit from skilled therapeutic intervention in order to improve the following deficits and impairments:  Abnormal gait, Decreased balance, Decreased safety awareness, Decreased strength, Dizziness  Visit Diagnosis: Dizziness and giddiness  Unsteadiness on feet  Repeated falls     Problem List Patient Active Problem List   Diagnosis Date Noted  . TIA (transient ischemic attack) 12/15/2016  . CV[place] (cerebral vascular accident) ([place] 12/15/2016  . Septic shock ([place] 11/19/2016  . Pressure injury of skin 10/03/2016  . Sepsis ([place] 10/02/2016    [name] DeutscherPT, DPT #[id number] Deutscher11/30/2018, 3:25 PM  [name] CenterSERV[facility] [address] [address]Phone: [phone]  Fax:  [phone] Name: GEMELIO SCHNELLERMRN: [id number] of Birth: 907/12/1945

## 2017-09-20 DIAGNOSIS — Z79899 Other long term (current) drug therapy: Secondary | ICD-10-CM | POA: Diagnosis not present

## 2017-09-20 DIAGNOSIS — R791 Abnormal coagulation profile: Secondary | ICD-10-CM | POA: Diagnosis not present

## 2017-09-20 DIAGNOSIS — R0602 Shortness of breath: Secondary | ICD-10-CM | POA: Diagnosis not present

## 2017-09-20 DIAGNOSIS — E1140 Type 2 diabetes mellitus with diabetic neuropathy, unspecified: Secondary | ICD-10-CM | POA: Diagnosis not present

## 2017-09-20 DIAGNOSIS — Z23 Encounter for immunization: Secondary | ICD-10-CM | POA: Diagnosis not present

## 2017-09-20 DIAGNOSIS — I480 Paroxysmal atrial fibrillation: Secondary | ICD-10-CM | POA: Diagnosis not present

## 2017-09-20 DIAGNOSIS — E782 Mixed hyperlipidemia: Secondary | ICD-10-CM | POA: Diagnosis not present

## 2017-09-20 DIAGNOSIS — I10 Essential (primary) hypertension: Secondary | ICD-10-CM | POA: Diagnosis not present

## 2017-09-20 DIAGNOSIS — I2510 Atherosclerotic heart disease of native coronary artery without angina pectoris: Secondary | ICD-10-CM | POA: Diagnosis not present

## 2017-09-21 DIAGNOSIS — L97521 Non-pressure chronic ulcer of other part of left foot limited to breakdown of skin: Secondary | ICD-10-CM | POA: Diagnosis not present

## 2017-09-21 DIAGNOSIS — L89620 Pressure ulcer of left heel, unstageable: Secondary | ICD-10-CM | POA: Diagnosis not present

## 2017-09-24 ENCOUNTER — Other Ambulatory Visit: Payer: Self-pay

## 2017-09-24 ENCOUNTER — Ambulatory Visit: Payer: PPO | Attending: Neurology

## 2017-09-24 VITALS — BP 151/67 | HR 67

## 2017-09-24 DIAGNOSIS — R42 Dizziness and giddiness: Secondary | ICD-10-CM | POA: Insufficient documentation

## 2017-09-24 DIAGNOSIS — R2681 Unsteadiness on feet: Secondary | ICD-10-CM | POA: Diagnosis not present

## 2017-09-24 DIAGNOSIS — R296 Repeated falls: Secondary | ICD-10-CM | POA: Diagnosis not present

## 2017-09-24 NOTE — Therapy (Signed)
[place] MAIN [facility] SERVICES [address] [place], [place], [address] Phone: [phone]   Fax:  [phone]  Physical Therapy Treatment  Patient Details  Name: [name] MRN: [id number] Date of Birth: May 02, 1946 Referring Provider: Dr. Manuella Ghazi   Encounter Date: 09/24/2017  PT End of Session - 09/24/17 1001    Visit Number  10    Number of Visits  13    Date for PT Re-Evaluation  10/08/17    Authorization Type  3/10    PT Start Time  0945    PT Stop Time  1040    PT Time Calculation (min)  55 min    Equipment Utilized During Treatment  Gait belt    Activity Tolerance  Patient tolerated treatment well    Behavior During Therapy  [facility] for tasks assessed/performed       Past Medical History:  Diagnosis Date  . Chronic pain   . Coronary artery disease   . Diabetes mellitus without complication ([place])   . DJD (degenerative joint disease)   . GERD (gastroesophageal reflux disease)   . Hyperlipemia   . Hypertension   . Insomnia   . Neuropathy     Past Surgical History:  Procedure Laterality Date  . BACK SURGERY    . CARDIAC SURGERY    . CARDIOVERSION N/A 05/04/2017   Procedure: CARDIOVERSION;  Surgeon: Corey Skains, MD;  Location: [facility] ORS;  Service: Cardiovascular;  Laterality: N/A;  . JOINT REPLACEMENT    . TEE WITHOUT CARDIOVERSION N/A 10/08/2016   Procedure: TRANSESOPHAGEAL ECHOCARDIOGRAM (TEE);  Surgeon: Teodoro Spray, MD;  Location: [facility] ORS;  Service: Cardiovascular;  Laterality: N/A;  . TEE WITHOUT CARDIOVERSION N/A 11/23/2016   Procedure: TRANSESOPHAGEAL ECHOCARDIOGRAM (TEE);  Surgeon: Isaias Cowman, MD;  Location: [facility] ORS;  Service: Cardiovascular;  Laterality: N/A;    Vitals:   09/24/17 1005  BP: (!) 151/67  Pulse: 67  SpO2: 95%    Subjective Assessment - 09/24/17 1000    Subjective  Pt reports he is doing well on this date. He is having some increased dizziness as well as some low back "stiffness"  on this date. He is performing exercises at home. No specific questions or concerns.     Pertinent History  Pt reports that he had 2 hospital visits for endocarditis starting in December 2017 and then January 2018. 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 He started having dizziness at that time. Symptoms have persisted since that time without improvement or worsening. He has a prolonged history of bilateral LE neuropathy over the course of multiple years. 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 He has a-fib, CAD, hyperlipidemia, and HTN. History of alcoholism in the past. 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 He reports recent hypotension and his BP medications have been modified. When asked about his symptoms pt reports  "It feels like I lose feeling in my head" Describes feeling "woozy." Denies vertigo. Reports feeling lightheaded like he might pass out. He had a syncopal episode 1.5 months ago. He has a visit with his cardiologist next week.    Limitations  Walking    Diagnostic tests  MRI brain 27/27/18: acute CVA superior L  parietal and right external capsule as well as chronic ischemia. CTA head/neck: multiple areas of severe stenosis    Patient Stated Goals  Decrease symptoms and return to leisure activities    Currently in Pain?  No/denies           Neuromuscular Re-education: NuStep L1 x 10 minutes with moist heat on low back during history (8 minutes unbilled);  Neuro screen with patient due to increased instability today. Denies N/T to light touch. No focal weakness with UE strength testing. Facial strength symmetrical and finger to nose normal. Pronator drift negative. Pt educated about signs/symptoms of stroke with FAST  acronym.  Cone tapping: On Airex pad, patient performed alternate foot tapping to cones in series of one and two targets as called out by therapist. Patient reached to touch // bars several times during activity secondary to loss of balance.  Patient requiring assistance ranging from CGA to Mod A at times due to loss of balance.   Obstacle Course: In // bars, patient performed multiple different obstacle courses multiple reps which included the following activities: navigating over balance pods, foam bricks, stepping over varying size objects and stepping onto and over foam pads and Airex pad as well as 6" wooden step. Patient improved with practice navigating on the course and was most challenged by stepping onto compliant surfaces. Patient did not use SPC during this activity but did reach out to touch // bar multiple times due to loss of balance and required CGA.  Rockerboard A/P and R/L direction with static balance, horizontal/vertical ball passes with head/eye follow, weight shifting, and 2# overhead bar lifting to challenge dynamic balance; A/P with ball tossing into basket to challenge dynamic balance;                    PT Education - 09/24/17 1001    Education provided  Yes    Education Details  exercise form/technique    Person(s) Educated  Patient    Methods  Explanation    Comprehension  Verbalized understanding       PT Short Term Goals - 08/27/17 1007      PT SHORT TERM GOAL #1   Title  Pt will be independent with HEP in order to improve strength and balance in order to decrease fall risk and improve function at home and with leisure activities    Time  6    Period  Weeks    Status  Achieved        PT Long Term Goals - 08/27/17 1007      PT LONG TERM GOAL #1   Title  Pt will improve DGI by at least 3 points in order to demonstrate clinically significant improvement in balance and decreased risk for falls     Baseline  07/09/17: 10/24; 08/10/2017  scored 17/24    Time  6    Period  Weeks    Status  Achieved      PT LONG TERM GOAL #2   Title  Pt will improve ABC by at least 13% in order to demonstrate clinically significant improvement in balance confidence.     Baseline  07/09/17: 40.9%; on 08/10/17 scored 48.8%    Time  6    Period  Weeks    Status  Partially Met      PT LONG TERM GOAL #3   Title  Pt will decrease DHI score by at least 18 points in order to demonstrate clinically significant reduction  in disability     Baseline  07/09/17: 60/100; on 08/10/17 scored 64/100    Time  6    Period  Weeks    Status  Partially Met      PT LONG TERM GOAL #4   Title  Pt will improve Berg Balance score by at least 10 points to demonstrate decreased risk of falling.      Baseline  07/13/17: 31/56; on 08/10/17 scored 39/56    Time  4    Period  Weeks    Status  Partially Met      PT LONG TERM GOAL #5   Title  Patient will demonstrate reduced falls risk as evidenced by Dynamic Gait Index (DGI) >19/24.    Time  8    Period  Weeks    Status  On-going      PT LONG TERM GOAL #6   Title  Patient will reduce falls risk as indicated by Activities Specific Balance Confidence Scale (ABC) >67%.    Time  8    Period  Weeks    Status  On-going            Plan - 09/24/17 1002    Clinical Impression Statement  Pt with increased instability today. While walking into the gym he stumbles to the right multiple times and even bumps into the wall twice. Brief neuro screen is negative and pt denies focal numbness/tingling or slurred speech. Pt struggles with frequent posterior LOB during session. Pt encouraged to continue HEP and follow-up as scheduled.    Rehab Potential  Fair    PT Frequency  2x / week    PT Duration  6 weeks    PT Treatment/Interventions  ADLs/Self Care Home Management;Aquatic Therapy;Biofeedback;Canalith Repostioning;Cryotherapy;Electrical Stimulation;Iontophoresis 29m/ml Dexamethasone;Moist Heat;Traction;Ultrasound;Gait  training;Stair training;Functional mobility training;Therapeutic activities;Therapeutic exercise;DME Instruction;Balance training;Neuromuscular re-education;Patient/family education;Manual techniques;Energy conservation;Vestibular;Visual/perceptual remediation/compensation    PT Next Visit Plan  Update goals with patient; cone tapping, continue to work on progressions of balance activities and vestibular exercises. obstacle course, stepping onto and over variety of different heights and compliant surfaces.     PT Home Exercise Plan  VOR x 1 horizontal in sitting with back support 1 min x 3, 4x/day; Sit<>stand with use of BUE, semitandem balance with horizontal head turns, sitting ball pass with head/eye follow with conflicting background    Consulted and Agree with Plan of Care  Patient       Patient will benefit from skilled therapeutic intervention in order to improve the following deficits and impairments:  Abnormal gait, Decreased balance, Decreased safety awareness, Decreased strength, Dizziness  Visit Diagnosis: Dizziness and giddiness  Unsteadiness on feet  Repeated falls     Problem List Patient Active Problem List   Diagnosis Date Noted  . TIA (transient ischemic attack) 12/15/2016  . CV[place] (cere[place] vascular accident) ([place] 12/15/2016  . Septic shock ([place] 11/19/2016  . Pressure injury of skin 10/03/2016  . Sepsis ([place] 10/02/2016   [name] GroutPT, DPT   [name] 09/24/2017, 12:35 PM  [place] [facility] [address] [address]Phone: [phone]  Fax:  [phone] Name: [name] HANNUMMRN: [id number] of Birth: 9[name] 11, 1947

## 2017-09-28 ENCOUNTER — Encounter: Payer: PPO | Admitting: Physical Therapy

## 2017-09-28 DIAGNOSIS — S61412A Laceration without foreign body of left hand, initial encounter: Secondary | ICD-10-CM | POA: Diagnosis not present

## 2017-10-01 ENCOUNTER — Encounter: Payer: PPO | Admitting: Physical Therapy

## 2017-10-05 ENCOUNTER — Ambulatory Visit: Payer: PPO | Admitting: Physical Therapy

## 2017-10-05 ENCOUNTER — Encounter: Payer: Self-pay | Admitting: Physical Therapy

## 2017-10-05 DIAGNOSIS — R2681 Unsteadiness on feet: Secondary | ICD-10-CM

## 2017-10-05 DIAGNOSIS — R296 Repeated falls: Secondary | ICD-10-CM

## 2017-10-05 DIAGNOSIS — R42 Dizziness and giddiness: Secondary | ICD-10-CM | POA: Diagnosis not present

## 2017-10-05 DIAGNOSIS — S61412D Laceration without foreign body of left hand, subsequent encounter: Secondary | ICD-10-CM | POA: Diagnosis not present

## 2017-10-05 NOTE — Therapy (Signed)
[place] MAIN [facility] SERVICES [address] [place], [place], [address] Phone: [phone]   Fax:  [phone]  Physical Therapy Treatment  Patient Details  Name: [name] MRN: [id number] Date of Birth: 02/13/1946 Referring Provider: Dr. Manuella Ghazi   Encounter Date: 10/05/2017  PT End of Session - 10/05/17 1044    Visit Number  11    Number of Visits  17    Date for PT Re-Evaluation  11/16/17    Authorization Type  1/10    PT Start Time  1030    PT Stop Time  1128    PT Time Calculation (min)  58 min    Equipment Utilized During Treatment  Gait belt    Activity Tolerance  Patient tolerated treatment well    Behavior During Therapy  [facility] for tasks assessed/performed       Past Medical History:  Diagnosis Date  . Chronic pain   . Coronary artery disease   . Diabetes mellitus without complication ([place])   . DJD (degenerative joint disease)   . GERD (gastroesophageal reflux disease)   . Hyperlipemia   . Hypertension   . Insomnia   . Neuropathy     Past Surgical History:  Procedure Laterality Date  . BACK SURGERY    . CARDIAC SURGERY    . CARDIOVERSION N/A 05/04/2017   Procedure: CARDIOVERSION;  Surgeon: Corey Skains, MD;  Location: [facility] ORS;  Service: Cardiovascular;  Laterality: N/A;  . JOINT REPLACEMENT    . TEE WITHOUT CARDIOVERSION N/A 10/08/2016   Procedure: TRANSESOPHAGEAL ECHOCARDIOGRAM (TEE);  Surgeon: Teodoro Spray, MD;  Location: [facility] ORS;  Service: Cardiovascular;  Laterality: N/A;  . TEE WITHOUT CARDIOVERSION N/A 11/23/2016   Procedure: TRANSESOPHAGEAL ECHOCARDIOGRAM (TEE);  Surgeon: Isaias Cowman, MD;  Location: [facility] ORS;  Service: Cardiovascular;  Laterality: N/A;    There were no vitals filed for this visit.  Subjective Assessment - 10/05/17 1042    Subjective  Patient states he rolled out of bed falling onto his right side this past week. Patient states his foot ulcer is not healing and that his  follow up appointment with Dr. Vickki Muff is 10/21/2017. Encouraged patient to call Dr. Alvera Singh office to discuss his foot ulcer.     Pertinent History  Pt reports that he had 2 hospital visits for endocarditis starting in December 2017 and then January 2018. 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 He started having dizziness at that time. Symptoms have persisted since that time without improvement or worsening. He has a prolonged history of bilateral LE neuropathy over the course of multiple years. 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 He has a-fib, CAD, hyperlipidemia, and HTN. History of alcoholism in the past. 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 He reports recent hypotension and his BP medications have been modified. When asked about his symptoms pt reports  "It feels like I lose feeling in my head" Describes feeling "woozy." Denies vertigo. Reports feeling lightheaded like he might pass out. He had a syncopal episode 1.5 months ago. He has a visit with his cardiologist next week.    Limitations  Walking    Diagnostic tests  MRI brain 27/27/18: acute CVA superior  L parietal and right external capsule as well as chronic ischemia. CTA head/neck: multiple areas of severe stenosis    Patient Stated Goals  Decrease symptoms and return to leisure activities    Currently in Pain?  Yes    Pain Score  7     Pain Location  Back    Pain Orientation  Lower    Pain Descriptors / Indicators  Sore    Pain Type  Chronic pain         [facility] PT Assessment - 10/05/17 1115      Berg Balance Test   Sit to Stand  Able to stand  independently using hands    Standing Unsupported  Able to stand safely 2 minutes    Sitting with Back Unsupported but Feet Supported  on Floor or Stool  Able to sit safely and securely 2 minutes    Stand to Sit  Sits safely with minimal use of hands    Transfers  Able to transfer safely, definite need of hands    Standing Unsupported with Eyes Closed  Able to stand 10 seconds with supervision    Standing Ubsupported with Feet Together  Able to place feet together independently and stand for 1 minute with supervision    From Standing, Reach Forward with Outstretched Arm  Can reach confidently >25 cm (10")    From Standing Position, Pick up Object from Floor  Able to pick up shoe, needs supervision    From Standing Position, Turn to Look Behind Over each Shoulder  Looks behind from both sides and weight shifts well    Turn 360 Degrees  Able to turn 360 degrees safely but slowly    Standing Unsupported, Alternately Place Feet on Step/Stool  Able to stand independently and safely and complete 8 steps in 20 seconds    Standing Unsupported, One Foot in Front  Able to place foot tandem independently and hold 30 seconds    Standing on One Leg  Able to lift leg independently and hold equal to or more than 3 seconds    Total Score  47      Dynamic Gait Index   Level Surface  Mild Impairment    Change in Gait Speed  Normal    Gait with Horizontal Head Turns  Normal    Gait with Vertical Head Turns  Mild Impairment    Gait and Pivot Turn  Normal    Step Over Obstacle  Mild Impairment    Step Around Obstacles  Normal    Steps  Moderate Impairment    Total Score  19       Neuromuscular Re-education: FUNCTIONAL OUTCOME MEASURES:  Results Comments  DHI 56/100 Moderate perception of handicap; in need of intervention  ABC Scale 56.8% Falls risk; in need of intervention  DGI 19/24 Falls risk; in need of intervention  BERG balance test 47/56 Mild Falls risk; in need of intervention   Performed DGI, ABC scale, DHI and BERG functional outcome measures.  Discussed test results and compared to prior testing. Discussed progress towards  goals and if patient had any additional goals that he would like to work on with therapy.  Patient indicated that he would like to increase his walking distances and eventually be able to walk a mile.     PT Education - 10/05/17 1043    Education provided  Yes    Education Details  discussed functional outcome testing and results, compared to prior testing, discussed progress towards  goals and plan of care    Person(s) Educated  Patient    Methods  Explanation    Comprehension  Verbalized understanding       PT Short Term Goals - 10/05/17 1044      PT SHORT TERM GOAL #1   Title  Pt will be independent with HEP in order to improve strength and balance in order to decrease fall risk and improve function at home and with leisure activities    Time  6    Period  Weeks    Status  Achieved        PT Long Term Goals - 10/05/17 1044      PT LONG TERM GOAL #1   Title  Pt will improve DGI by at least 3 points in order to demonstrate clinically significant improvement in balance and decreased risk for falls     Baseline  07/09/17: 10/24; 08/10/2017 scored 17/24; scored 19/24 on 10/05/18    Time  6    Period  Weeks    Status  Achieved      PT LONG TERM GOAL #2   Title  Pt will improve ABC by at least 13% in order to demonstrate clinically significant improvement in balance confidence.     Baseline  07/09/17: 40.9%; on 08/10/17 scored 48.8%; scored 56.8% on 10/05/17    Time  6    Period  Weeks    Status  Achieved      PT LONG TERM GOAL #3   Title  Pt will decrease DHI score by at least 18 points in order to demonstrate clinically significant reduction in disability     Baseline  07/09/17: 60/100; on 08/10/17 scored 64/100; scored 56/100 on 10/05/17    Time  6    Period  Weeks    Status  Partially Met      PT LONG TERM GOAL #4   Title  Pt will improve Berg Balance score by at least 10 points to demonstrate decreased risk of falling.      Baseline  07/13/17: 31/56; on 08/10/17 scored  39/56; scored 47/56 on 10/05/17    Time  4    Period  Weeks    Status  Achieved      PT LONG TERM GOAL #5   Title  Patient will demonstrate reduced falls risk as evidenced by Dynamic Gait Index (DGI) >19/24.    Baseline  scored 17/24 on 08/11/2017; scored 19/24 on 10/05/17    Time  8    Period  Weeks    Status  Partially Met      PT LONG TERM GOAL #6   Title  Patient will reduce falls risk as indicated by Activities Specific Balance Confidence Scale (ABC) >67%.    Baseline  scored 48% on 10/24 and scored 56.8% on 10/05/17    Time  8    Period  Weeks    Status  Partially Met            Plan - 10/05/17 1044    Clinical Impression Statement  Repeated functional outcome testing this date and compared to prior testing. Patient has met 1/1 STG and 3/6 LTGs. Patient has partially met hte remainin 3/6 LTGs. Patient improved from 10/24 on the DGI to 19/24. Patient improved from 40.9% to 56.8% on the ABC scale and from 31/56 to 47/56 on the BERG balance scale. Patient continues to score moderate perception of handicap on the [facility] with a score of 56/100 this  date. Patient has made good progress in regards to improving his balance. Patient states that he feels therapy is helping him. Patient would like to improve his walking distance and states his goal is to be able to walk a mile. Patient reports that he feels the ulcer on his left heel is not healing and that his follow-up appointment is not until 10/21/2017. Encouraged patient to call Dr. Vickki Muff to report his concerns and to see if Dr. Vickki Muff would like to see the patient back in the clinic sooner. Patient would like to continue PT services to further address the functional deficits listed and to address goals as set on plan of care.     Rehab Potential  Fair    PT Frequency  1x / week    PT Duration  6 weeks    PT Treatment/Interventions  ADLs/Self Care Home Management;Aquatic Therapy;Canalith Repostioning;Moist Heat;Gait training;Stair  training;Functional mobility training;Therapeutic activities;Therapeutic exercise;DME Instruction;Balance training;Neuromuscular re-education;Patient/family education;Manual techniques;Energy conservation;Vestibular;Visual/perceptual remediation/compensation;Cryotherapy    PT Next Visit Plan  consider doing 10 minute walking test and working on LE strengthening next session, [employer] tower walking    PT Home Exercise Plan  VOR x 1 horizontal in sitting with back support 1 min x 3, 4x/day; Sit<>stand with use of BUE, semitandem balance with horizontal head turns, sitting ball pass with head/eye follow with conflicting background    Consulted and Agree with Plan of Care  Patient       Patient will benefit from skilled therapeutic intervention in order to improve the following deficits and impairments:  Abnormal gait, Decreased balance, Decreased safety awareness, Decreased strength, Dizziness  Visit Diagnosis: Dizziness and giddiness  Unsteadiness on feet  Repeated falls   G-Codes - October 09, 2017 1525    Functional Assessment Tool Used (Outpatient Only)  ABC, DHI, DGI, clinical judgement    Functional Limitation  Mobility: Walking and moving around    Mobility: Walking and Moving Around Current Status [phone])  At least 40 percent but less than 60 percent impaired, limited or restricted    Mobility: Walking and Moving Around Goal Status [phone])  At least 20 percent but less than 40 percent impaired, limited or restricted       Problem List Patient Active Problem List   Diagnosis Date Noted  . TIA (transient ischemic attack) 12/15/2016  . CVA (cerebral vascular accident) ([place]) 12/15/2016  . Septic shock ([place]) 11/19/2016  . Pressure injury of skin 10/03/2016  . Sepsis ([place]) 10/02/2016   Lady Deutscher PT, DPT [phone] Lady Deutscher 09-Oct-2017, 3:27 PM  [place] MAIN [facility] SERVICES [address] [place], [place], [address] Phone: [phone]   Fax:   [phone]  Name: [name] MRN: [id number] Date of Birth: 02-04-1946

## 2017-10-13 ENCOUNTER — Ambulatory Visit: Payer: PPO | Admitting: Physical Therapy

## 2017-10-13 DIAGNOSIS — I2510 Atherosclerotic heart disease of native coronary artery without angina pectoris: Secondary | ICD-10-CM | POA: Diagnosis not present

## 2017-10-13 DIAGNOSIS — R42 Dizziness and giddiness: Secondary | ICD-10-CM | POA: Diagnosis not present

## 2017-10-13 DIAGNOSIS — R0602 Shortness of breath: Secondary | ICD-10-CM | POA: Diagnosis not present

## 2017-10-21 DIAGNOSIS — R0602 Shortness of breath: Secondary | ICD-10-CM | POA: Diagnosis not present

## 2017-10-21 DIAGNOSIS — I2510 Atherosclerotic heart disease of native coronary artery without angina pectoris: Secondary | ICD-10-CM | POA: Diagnosis not present

## 2017-10-21 DIAGNOSIS — L89620 Pressure ulcer of left heel, unstageable: Secondary | ICD-10-CM | POA: Diagnosis not present

## 2017-10-21 DIAGNOSIS — L97521 Non-pressure chronic ulcer of other part of left foot limited to breakdown of skin: Secondary | ICD-10-CM | POA: Diagnosis not present

## 2017-10-21 DIAGNOSIS — Z5181 Encounter for therapeutic drug level monitoring: Secondary | ICD-10-CM | POA: Diagnosis not present

## 2017-10-21 DIAGNOSIS — I350 Nonrheumatic aortic (valve) stenosis: Secondary | ICD-10-CM | POA: Diagnosis not present

## 2017-10-21 DIAGNOSIS — E1140 Type 2 diabetes mellitus with diabetic neuropathy, unspecified: Secondary | ICD-10-CM | POA: Diagnosis not present

## 2017-10-21 DIAGNOSIS — I480 Paroxysmal atrial fibrillation: Secondary | ICD-10-CM | POA: Diagnosis not present

## 2017-10-21 DIAGNOSIS — Z7901 Long term (current) use of anticoagulants: Secondary | ICD-10-CM | POA: Diagnosis not present

## 2017-10-22 ENCOUNTER — Encounter: Payer: PPO | Admitting: Physical Therapy

## 2017-10-25 DIAGNOSIS — G63 Polyneuropathy in diseases classified elsewhere: Secondary | ICD-10-CM | POA: Diagnosis not present

## 2017-10-25 DIAGNOSIS — I480 Paroxysmal atrial fibrillation: Secondary | ICD-10-CM | POA: Diagnosis not present

## 2017-10-25 DIAGNOSIS — R42 Dizziness and giddiness: Secondary | ICD-10-CM | POA: Diagnosis not present

## 2017-10-25 DIAGNOSIS — Z8673 Personal history of transient ischemic attack (TIA), and cerebral infarction without residual deficits: Secondary | ICD-10-CM | POA: Diagnosis not present

## 2017-10-26 ENCOUNTER — Ambulatory Visit: Payer: PPO | Attending: Neurology | Admitting: Physical Therapy

## 2017-10-26 ENCOUNTER — Encounter: Payer: Self-pay | Admitting: Physical Therapy

## 2017-10-26 VITALS — BP 142/64

## 2017-10-26 DIAGNOSIS — R42 Dizziness and giddiness: Secondary | ICD-10-CM | POA: Diagnosis not present

## 2017-10-26 DIAGNOSIS — R2681 Unsteadiness on feet: Secondary | ICD-10-CM | POA: Insufficient documentation

## 2017-10-26 DIAGNOSIS — R296 Repeated falls: Secondary | ICD-10-CM | POA: Insufficient documentation

## 2017-10-26 NOTE — Therapy (Signed)
[place] MAIN [facility] SERVICES [address] [place], [place], [address] Phone: [phone]   Fax:  [phone]  Physical Therapy Treatment  Patient Details  Name: [name] MRN: [id number] Date of Birth: 14-Feb-1946 Referring Provider: Dr. Manuella Ghazi   Encounter Date: 10/26/2017  PT End of Session - 10/26/17 1002    Visit Number  12    Number of Visits  17    Date for PT Re-Evaluation  11/16/17    PT Start Time  0900    PT Stop Time  0955    PT Time Calculation (min)  55 min    Equipment Utilized During Treatment  Gait belt    Activity Tolerance  Patient tolerated treatment well    Behavior During Therapy  [facility] for tasks assessed/performed       Past Medical History:  Diagnosis Date  . Chronic pain   . Coronary artery disease   . Diabetes mellitus without complication ([place])   . DJD (degenerative joint disease)   . GERD (gastroesophageal reflux disease)   . Hyperlipemia   . Hypertension   . Insomnia   . Neuropathy     Past Surgical History:  Procedure Laterality Date  . BACK SURGERY    . CARDIAC SURGERY    . CARDIOVERSION N/A 05/04/2017   Procedure: CARDIOVERSION;  Surgeon: Corey Skains, MD;  Location: [facility] ORS;  Service: Cardiovascular;  Laterality: N/A;  . JOINT REPLACEMENT    . TEE WITHOUT CARDIOVERSION N/A 10/08/2016   Procedure: TRANSESOPHAGEAL ECHOCARDIOGRAM (TEE);  Surgeon: Teodoro Spray, MD;  Location: [facility] ORS;  Service: Cardiovascular;  Laterality: N/A;  . TEE WITHOUT CARDIOVERSION N/A 11/23/2016   Procedure: TRANSESOPHAGEAL ECHOCARDIOGRAM (TEE);  Surgeon: Isaias Cowman, MD;  Location: [facility] ORS;  Service: Cardiovascular;  Laterality: N/A;    There were no vitals filed for this visit.  Subjective Assessment - 10/26/17 0906    Subjective  Patient reports no SOB at rest but states that upon standing and moving he is having onset of SOB and states it is making him unsteady as well. Patient reports no pain.  Patient reports he has a very mild cold without temperature. Patient states that he went to see the neurologist Dr. Trena Platt NP yesterday and Dr. Nehemiah Massed last week because of worsening shortness of breath and activity intolerance. Patient reports they could not find anything and did not change any of his medications. Patient reports he saw Dr. Vickki Muff on 1/3 and reports his heel ulcer is improving and healing.     Pertinent History  Pt reports that he had 2 hospital visits for endocarditis starting in December 2017 and then January 2018. 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 He started having dizziness at that time. Symptoms have persisted since that time without improvement or worsening. He has a prolonged history of bilateral LE neuropathy over the course of multiple years. 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 He has a-fib, CAD, hyperlipidemia, and HTN. History of alcoholism in the past. 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 He reports recent hypotension and his BP medications have been modified. When asked about his symptoms pt reports  "It feels like I lose feeling in my head"  Describes feeling "woozy." Denies vertigo. Reports feeling lightheaded like he might pass out. He had a syncopal episode 1.5 months ago. He has a visit with his cardiologist next week.    Limitations  Walking    Diagnostic tests  MRI brain 27/27/18: acute CVA superior L parietal and right external capsule as well as chronic ischemia. CTA head/neck: multiple areas of severe stenosis    Patient Stated Goals  Decrease symptoms and return to leisure activities    Currently in Pain?  No/denies      Neuromuscular Re-education: Patient arrives to clinic using [place]. Patient  became notably short of breath and needed a standing rest break when walking from the reception area to the gym which is a marked decline compared to patient's prior endurance levels with activity.  Patient's vital signs obtained and patient with 130/56 mmHg at rest and oxygen saturation levels fluctuating between 87-95% at rest.  Patient reports that he had a follow-up visit with Dr. Raul Del PA yesterday and saw Dr. Nehemiah Massed last week in regards to his new onset of worsening shortness of breath with activity. Patient states that the cardiologist could not find a reason for his worsening SOB and that no changes were made to his prescriptions or medical plan. Patient reports that his doctor recommended that he use a rollator walker at this time. Patient reports that he has never used a rollator walker before. This writer is in agreement that patient might benefit from use of rollator walker both for energy conservation as well as safety with mobility.  Patient educated as to safe use and how to adjust rollator walker. Patient ambulated 28' trials with rollator walker with pulse oximetry monitoring. During ambulation, patient's oxygen saturation level was 89-91% and HR was 107-109 bpm. Patient reported 6/10 shortness of breath after ambulating only 2 laps and required a rest break. Upon sitting, patient's oxygen saturation level was 87% and HR was 102 bpm. With continued sitting, patient's oxygen saturation level further decreased to 83-84% and HR was 89-92 bpm.  Patient's blood pressure was 145/52 mmHg. Patient required about 5 minute or more seated rest break to increase oxygen sat levels and do ease shortness of breath. Patient educated as to how to safely lock the rollator walker and turn to sit if he became tired or SOB during ambulation. Patient practice ambulating 25' trials with stopping to turn and sit safely and then resume walking with CGA. Patient demonstrated good technique and safety with ambulating  with rollator walker.  Patient educated as to pursed lip breathing. Patient demonstrated back pursed lip breathing technique during rest break.  Patient then ambulated 89' with rollator walker with pulse oximetry monitoring. Patient's oxygen saturation levels were 88-94% and patient reported that he began to experience 7/10 dizziness and required a sitting rest break.  Encouraged patient to follow-up with his primary care physician Dr. Doy Hutching if he continued to experience shortness of breath and lower than normal oxygen saturation levels and encouraged patient to call Dr. Stacie Glaze office to report his current symptoms. Patient reports he has a home blood pressure monitor and pulse oximeter monitor and that he knows how to use them. Patient in agreement.  Patient noted to have increase in dizziness and light-headedness symptoms upon rising from a chair several times this visit. Therefore took patient's blood pressure in standing and it was 142/64 mmHg. Reviewed strategies to help prevent symptoms of potential orthostatic hypotension with patient again.     PT Education - 10/26/17 1001  Education provided  Yes    Education Details  discussed pursed lip breathing, educated as to use and safety with use of rollator walker    Person(s) Educated  Patient    Methods  Explanation;Demonstration;Verbal cues    Comprehension  Verbalized understanding;Returned demonstration       PT Short Term Goals - 10/05/17 1044      PT SHORT TERM GOAL #1   Title  Pt will be independent with HEP in order to improve strength and balance in order to decrease fall risk and improve function at home and with leisure activities    Time  6    Period  Weeks    Status  Achieved        PT Long Term Goals - 10/05/17 1044      PT LONG TERM GOAL #1   Title  Pt will improve DGI by at least 3 points in order to demonstrate clinically significant improvement in balance and decreased risk for falls     Baseline  07/09/17:  10/24; 08/10/2017 scored 17/24; scored 19/24 on 10/05/18    Time  6    Period  Weeks    Status  Achieved      PT LONG TERM GOAL #2   Title  Pt will improve ABC by at least 13% in order to demonstrate clinically significant improvement in balance confidence.     Baseline  07/09/17: 40.9%; on 08/10/17 scored 48.8%; scored 56.8% on 10/05/17    Time  6    Period  Weeks    Status  Achieved      PT LONG TERM GOAL #3   Title  Pt will decrease DHI score by at least 18 points in order to demonstrate clinically significant reduction in disability     Baseline  07/09/17: 60/100; on 08/10/17 scored 64/100; scored 56/100 on 10/05/17    Time  6    Period  Weeks    Status  Partially Met      PT LONG TERM GOAL #4   Title  Pt will improve Berg Balance score by at least 10 points to demonstrate decreased risk of falling.      Baseline  07/13/17: 31/56; on 08/10/17 scored 39/56; scored 47/56 on 10/05/17    Time  4    Period  Weeks    Status  Achieved      PT LONG TERM GOAL #5   Title  Patient will demonstrate reduced falls risk as evidenced by Dynamic Gait Index (DGI) >19/24.    Baseline  scored 17/24 on 08/11/2017; scored 19/24 on 10/05/17    Time  8    Period  Weeks    Status  Partially Met      PT LONG TERM GOAL #6   Title  Patient will reduce falls risk as indicated by Activities Specific Balance Confidence Scale (ABC) >67%.    Baseline  scored 48% on 10/24 and scored 56.8% on 10/05/17    Time  8    Period  Weeks    Status  Partially Met            Plan - 10/29/17 0902    Clinical Impression Statement  Patient arrives to clinic with notably decreased endurance levels with activity this date and increased imbalance noted with gait. Patient became moderately short of breath with ambulating from the reception area to the gym which is about 150' distance. Patient required multiple seated rest breaks with activity this date and monitored  patient's vital signs throughout session. Recommended  that patient call his primary care physician Dr. Doy Hutching to report his symptoms. Patient reports that he did see his cardiologist last week in regards to his shortness of breath and worsening endurance but states they did not find any source of the problem and the only change to his medical plan was that his physician recommended use of a rollator walker. Patient with high systolic and low diastolic blood pressure readings and fluctuating oxygen saturation levels with oxygen levels dropping as low as 83% at rest. Patient educated as to safety and use of rollator walker at this time as well as pursed lip breathing technique. Patient would benefit from continued PT services to further address functional deficits and to try to reduce falls risk.     Rehab Potential  Fair    PT Frequency  1x / week    PT Duration  6 weeks    PT Treatment/Interventions  ADLs/Self Care Home Management;Aquatic Therapy;Canalith Repostioning;Moist Heat;Gait training;Stair training;Functional mobility training;Therapeutic activities;Therapeutic exercise;DME Instruction;Balance training;Neuromuscular re-education;Patient/family education;Manual techniques;Energy conservation;Vestibular;Visual/perceptual remediation/compensation;Cryotherapy    PT Next Visit Plan  consider doing 10 minute walking test and working on LE strengthening next session, Biodex tower walking    PT Home Exercise Plan  VOR x 1 horizontal in sitting with back support 1 min x 3, 4x/day; Sit<>stand with use of BUE, semitandem balance with horizontal head turns, sitting ball pass with head/eye follow with conflicting background    Consulted and Agree with Plan of Care  Patient       Patient will benefit from skilled therapeutic intervention in order to improve the following deficits and impairments:  Abnormal gait, Decreased balance, Decreased safety awareness, Decreased strength, Dizziness  Visit Diagnosis: Dizziness and giddiness  Unsteadiness on  feet  Repeated falls     Problem List Patient Active Problem List   Diagnosis Date Noted  . TIA (transient ischemic attack) 12/15/2016  . CVA (cerebral vascular accident) ([place]) 12/15/2016  . Septic shock ([place]) 11/19/2016  . Pressure injury of skin 10/03/2016  . Sepsis ([place]) 10/02/2016   Lady Deutscher PT, DPT [phone] Lady Deutscher 10/26/2017, 10:02 AM  [place] MAIN [facility] SERVICES [address] [place], [place], [address] Phone: [phone]   Fax:  [phone]  Name: ORBIE GRUPE MRN: [id number] Date of Birth: 01-27-1946

## 2017-10-27 DIAGNOSIS — M545 Low back pain: Secondary | ICD-10-CM | POA: Diagnosis not present

## 2017-10-27 DIAGNOSIS — M5136 Other intervertebral disc degeneration, lumbar region: Secondary | ICD-10-CM | POA: Diagnosis not present

## 2017-10-27 DIAGNOSIS — G894 Chronic pain syndrome: Secondary | ICD-10-CM | POA: Diagnosis not present

## 2017-11-02 ENCOUNTER — Ambulatory Visit: Payer: PPO | Admitting: Physical Therapy

## 2017-11-02 ENCOUNTER — Other Ambulatory Visit: Payer: Self-pay

## 2017-11-02 ENCOUNTER — Encounter: Payer: Self-pay | Admitting: *Deleted

## 2017-11-02 ENCOUNTER — Encounter: Payer: Self-pay | Admitting: Physical Therapy

## 2017-11-02 ENCOUNTER — Observation Stay
Admission: EM | Admit: 2017-11-02 | Discharge: 2017-11-03 | Disposition: A | Discharge status: Home / Self Care | Payer: PPO | Attending: Internal Medicine | Admitting: Internal Medicine

## 2017-11-02 VITALS — BP 139/64

## 2017-11-02 DIAGNOSIS — Z8673 Personal history of transient ischemic attack (TIA), and cerebral infarction without residual deficits: Secondary | ICD-10-CM | POA: Diagnosis not present

## 2017-11-02 DIAGNOSIS — I480 Paroxysmal atrial fibrillation: Secondary | ICD-10-CM | POA: Diagnosis not present

## 2017-11-02 DIAGNOSIS — G4700 Insomnia, unspecified: Secondary | ICD-10-CM | POA: Diagnosis not present

## 2017-11-02 DIAGNOSIS — Z7982 Long term (current) use of aspirin: Secondary | ICD-10-CM | POA: Insufficient documentation

## 2017-11-02 DIAGNOSIS — Z7984 Long term (current) use of oral hypoglycemic drugs: Secondary | ICD-10-CM | POA: Insufficient documentation

## 2017-11-02 DIAGNOSIS — E876 Hypokalemia: Secondary | ICD-10-CM | POA: Diagnosis not present

## 2017-11-02 DIAGNOSIS — I10 Essential (primary) hypertension: Secondary | ICD-10-CM | POA: Insufficient documentation

## 2017-11-02 DIAGNOSIS — M1990 Unspecified osteoarthritis, unspecified site: Secondary | ICD-10-CM | POA: Insufficient documentation

## 2017-11-02 DIAGNOSIS — K219 Gastro-esophageal reflux disease without esophagitis: Secondary | ICD-10-CM | POA: Diagnosis not present

## 2017-11-02 DIAGNOSIS — R42 Dizziness and giddiness: Secondary | ICD-10-CM | POA: Diagnosis not present

## 2017-11-02 DIAGNOSIS — D649 Anemia, unspecified: Secondary | ICD-10-CM | POA: Diagnosis present

## 2017-11-02 DIAGNOSIS — Z952 Presence of prosthetic heart valve: Secondary | ICD-10-CM | POA: Insufficient documentation

## 2017-11-02 DIAGNOSIS — E669 Obesity, unspecified: Secondary | ICD-10-CM | POA: Insufficient documentation

## 2017-11-02 DIAGNOSIS — G8929 Other chronic pain: Secondary | ICD-10-CM | POA: Insufficient documentation

## 2017-11-02 DIAGNOSIS — Z881 Allergy status to other antibiotic agents status: Secondary | ICD-10-CM | POA: Diagnosis not present

## 2017-11-02 DIAGNOSIS — E785 Hyperlipidemia, unspecified: Secondary | ICD-10-CM | POA: Diagnosis not present

## 2017-11-02 DIAGNOSIS — Z6837 Body mass index (BMI) 37.0-37.9, adult: Secondary | ICD-10-CM | POA: Diagnosis not present

## 2017-11-02 DIAGNOSIS — E1140 Type 2 diabetes mellitus with diabetic neuropathy, unspecified: Secondary | ICD-10-CM | POA: Insufficient documentation

## 2017-11-02 DIAGNOSIS — Z7901 Long term (current) use of anticoagulants: Secondary | ICD-10-CM | POA: Diagnosis not present

## 2017-11-02 DIAGNOSIS — I4891 Unspecified atrial fibrillation: Secondary | ICD-10-CM | POA: Insufficient documentation

## 2017-11-02 DIAGNOSIS — D6489 Other specified anemias: Secondary | ICD-10-CM | POA: Insufficient documentation

## 2017-11-02 DIAGNOSIS — Z79899 Other long term (current) drug therapy: Secondary | ICD-10-CM | POA: Diagnosis not present

## 2017-11-02 DIAGNOSIS — I2510 Atherosclerotic heart disease of native coronary artery without angina pectoris: Secondary | ICD-10-CM | POA: Diagnosis not present

## 2017-11-02 DIAGNOSIS — R296 Repeated falls: Secondary | ICD-10-CM

## 2017-11-02 DIAGNOSIS — D509 Iron deficiency anemia, unspecified: Principal | ICD-10-CM | POA: Insufficient documentation

## 2017-11-02 DIAGNOSIS — R2681 Unsteadiness on feet: Secondary | ICD-10-CM

## 2017-11-02 DIAGNOSIS — E860 Dehydration: Secondary | ICD-10-CM | POA: Diagnosis not present

## 2017-11-02 DIAGNOSIS — R0602 Shortness of breath: Secondary | ICD-10-CM | POA: Diagnosis not present

## 2017-11-02 DIAGNOSIS — E782 Mixed hyperlipidemia: Secondary | ICD-10-CM | POA: Diagnosis not present

## 2017-11-02 LAB — BASIC METABOLIC PANEL
Anion gap: 12 (ref 5–15)
BUN: 25 mg/dL — AB (ref 6–20)
CALCIUM: 8.6 mg/dL — AB (ref 8.9–10.3)
CO2: 25 mmol/L (ref 22–32)
Chloride: 101 mmol/L (ref 101–111)
Creatinine, Ser: 1.18 mg/dL (ref 0.61–1.24)
GFR calc Af Amer: >60 mL/min (ref >60)
GLUCOSE: 137 mg/dL — AB (ref 65–99)
Potassium: 3.4 mmol/L — ABNORMAL LOW (ref 3.5–5.1)
Sodium: 138 mmol/L (ref 135–145)

## 2017-11-02 LAB — PREPARE RBC (CROSSMATCH)

## 2017-11-02 LAB — ABO/RH: ABO/RH(D): B POS

## 2017-11-02 LAB — CBC
HCT: 22.8 % — ABNORMAL LOW (ref 40.0–52.0)
Hemoglobin: 6.9 g/dL — ABNORMAL LOW (ref 13.0–18.0)
MCH: 22.5 pg — AB (ref 26.0–34.0)
MCHC: 30.5 g/dL — AB (ref 32.0–36.0)
MCV: 74 fL — ABNORMAL LOW (ref 80.0–100.0)
Platelets: 249 10*3/uL (ref 150–440)
RBC: 3.08 MIL/uL — ABNORMAL LOW (ref 4.40–5.90)
RDW: 23.2 % — AB (ref 11.5–14.5)
WBC: 7.6 10*3/uL (ref 3.8–10.6)

## 2017-11-02 LAB — GLUCOSE, CAPILLARY: Glucose-Capillary: 199 mg/dL — ABNORMAL HIGH (ref 65–99)

## 2017-11-02 LAB — MAGNESIUM: Magnesium: 1.8 mg/dL (ref 1.7–2.4)

## 2017-11-02 LAB — PROTIME-INR
INR: 1.49
Prothrombin Time: 17.9 seconds — ABNORMAL HIGH (ref 11.4–15.2)

## 2017-11-02 MED ORDER — GABAPENTIN 300 MG PO CAPS
600.0000 mg | ORAL_CAPSULE | Freq: Two times a day (BID) | ORAL | Status: DC
  Administered 2017-11-02 – 2017-11-03 (×2): 600 mg via ORAL
  Filled 2017-11-02 (×2): qty 2

## 2017-11-02 MED ORDER — BISACODYL 5 MG PO TBEC: 5.0000 mg | DELAYED_RELEASE_TABLET | Freq: Every day | ORAL | Status: DC | PRN

## 2017-11-02 MED ORDER — METHADONE HCL 10 MG PO TABS
5.0000 mg | ORAL_TABLET | Freq: Two times a day (BID) | ORAL | Status: DC
  Administered 2017-11-02 – 2017-11-03 (×2): 5 mg via ORAL
  Filled 2017-11-02 (×2): qty 1

## 2017-11-02 MED ORDER — SENNOSIDES-DOCUSATE SODIUM 8.6-50 MG PO TABS: 1.0000 | ORAL_TABLET | Freq: Every evening | ORAL | Status: DC | PRN

## 2017-11-02 MED ORDER — ACETAMINOPHEN 325 MG PO TABS: 650.0000 mg | ORAL_TABLET | Freq: Four times a day (QID) | ORAL | Status: DC | PRN

## 2017-11-02 MED ORDER — INSULIN ASPART 100 UNIT/ML ~~LOC~~ SOLN: 0.0000 [IU] | Freq: Every day | SUBCUTANEOUS | Status: DC

## 2017-11-02 MED ORDER — HYDROCODONE-ACETAMINOPHEN 5-325 MG PO TABS: 1.0000 | ORAL_TABLET | ORAL | Status: DC | PRN

## 2017-11-02 MED ORDER — INSULIN ASPART 100 UNIT/ML ~~LOC~~ SOLN
0.0000 [IU] | Freq: Three times a day (TID) | SUBCUTANEOUS | Status: DC
  Administered 2017-11-03 (×2): 1 [IU] via SUBCUTANEOUS
  Filled 2017-11-02 (×2): qty 1

## 2017-11-02 MED ORDER — MECLIZINE HCL 25 MG PO TABS
25.0000 mg | ORAL_TABLET | Freq: Three times a day (TID) | ORAL | Status: DC
  Administered 2017-11-02: 25 mg via ORAL
  Filled 2017-11-02 (×4): qty 1

## 2017-11-02 MED ORDER — AMIODARONE HCL 200 MG PO TABS
200.0000 mg | ORAL_TABLET | Freq: Every day | ORAL | Status: DC
  Administered 2017-11-03: 200 mg via ORAL

## 2017-11-02 MED ORDER — SODIUM CHLORIDE 0.9% FLUSH
3.0000 mL | Freq: Two times a day (BID) | INTRAVENOUS | Status: DC
  Administered 2017-11-02 – 2017-11-03 (×2): 3 mL via INTRAVENOUS

## 2017-11-02 MED ORDER — SODIUM CHLORIDE 0.9 % IV SOLN
250.0000 mL | INTRAVENOUS | Status: DC | PRN
  Administered 2017-11-02: 250 mL via INTRAVENOUS

## 2017-11-02 MED ORDER — ONDANSETRON HCL 4 MG PO TABS: 4.0000 mg | ORAL_TABLET | Freq: Four times a day (QID) | ORAL | Status: DC | PRN

## 2017-11-02 MED ORDER — ACETAMINOPHEN 650 MG RE SUPP: 650.0000 mg | Freq: Four times a day (QID) | RECTAL | Status: DC | PRN

## 2017-11-02 MED ORDER — LORATADINE 10 MG PO TABS
10.0000 mg | ORAL_TABLET | Freq: Every day | ORAL | Status: DC
  Administered 2017-11-03: 10 mg via ORAL
  Filled 2017-11-02: qty 1

## 2017-11-02 MED ORDER — ALBUTEROL SULFATE (2.5 MG/3ML) 0.083% IN NEBU: 2.5000 mg | INHALATION_SOLUTION | RESPIRATORY_TRACT | Status: DC | PRN

## 2017-11-02 MED ORDER — LISINOPRIL 10 MG PO TABS
10.0000 mg | ORAL_TABLET | Freq: Every day | ORAL | Status: DC
  Administered 2017-11-03: 10 mg via ORAL
  Filled 2017-11-02: qty 1

## 2017-11-02 MED ORDER — ASPIRIN EC 81 MG PO TBEC
81.0000 mg | DELAYED_RELEASE_TABLET | Freq: Every day | ORAL | Status: DC
  Administered 2017-11-03: 81 mg via ORAL
  Filled 2017-11-02: qty 1

## 2017-11-02 MED ORDER — POTASSIUM CHLORIDE CRYS ER 20 MEQ PO TBCR
40.0000 meq | EXTENDED_RELEASE_TABLET | Freq: Once | ORAL | Status: AC
  Administered 2017-11-02: 40 meq via ORAL
  Filled 2017-11-02: qty 2

## 2017-11-02 MED ORDER — SODIUM CHLORIDE 0.9 % IV SOLN
INTRAVENOUS | Status: DC
Start: 2017-11-02 — End: 2017-11-03
  Administered 2017-11-03: 03:00:00 via INTRAVENOUS

## 2017-11-02 MED ORDER — ONDANSETRON HCL 4 MG/2ML IJ SOLN: 4.0000 mg | Freq: Four times a day (QID) | INTRAMUSCULAR | Status: DC | PRN

## 2017-11-02 MED ORDER — DULOXETINE HCL 30 MG PO CPEP
30.0000 mg | ORAL_CAPSULE | Freq: Every evening | ORAL | Status: DC
  Administered 2017-11-02: 30 mg via ORAL
  Filled 2017-11-02 (×2): qty 1

## 2017-11-02 MED ORDER — SODIUM CHLORIDE 0.9 % IV SOLN: Freq: Once | INTRAVENOUS | Status: DC

## 2017-11-02 MED ORDER — METOPROLOL TARTRATE 25 MG PO TABS
25.0000 mg | ORAL_TABLET | Freq: Two times a day (BID) | ORAL | Status: DC
  Administered 2017-11-02 – 2017-11-03 (×2): 25 mg via ORAL
  Filled 2017-11-02 (×2): qty 1

## 2017-11-02 MED ORDER — ACETAMINOPHEN 325 MG PO TABS
650.0000 mg | ORAL_TABLET | Freq: Once | ORAL | Status: AC
  Administered 2017-11-02: 650 mg via ORAL
  Filled 2017-11-02: qty 2

## 2017-11-02 MED ORDER — PRAVASTATIN SODIUM 20 MG PO TABS
40.0000 mg | ORAL_TABLET | Freq: Every evening | ORAL | Status: DC
  Administered 2017-11-02: 40 mg via ORAL
  Filled 2017-11-02: qty 2

## 2017-11-02 MED ORDER — SODIUM CHLORIDE 0.9% FLUSH: 3.0000 mL | INTRAVENOUS | Status: DC | PRN

## 2017-11-02 MED ORDER — CYCLOBENZAPRINE HCL 10 MG PO TABS: 10.0000 mg | ORAL_TABLET | Freq: Every day | ORAL | Status: DC | PRN

## 2017-11-02 NOTE — Therapy (Signed)
[place] MAIN [facility] SERVICES [address] [place], [place], [address] Phone: [phone]   Fax:  [phone]  Physical Therapy Treatment  Patient Details  Name: DARL KUSS MRN: [id number] Date of Birth: [name] 06, 1946 Referring Provider: Dr. Manuella Ghazi   Encounter Date: 11/02/2017  PT End of Session - 11/02/17 0845    Visit Number  13    Number of Visits  17    Date for PT Re-Evaluation  11/16/17    PT Start Time  0846    PT Stop Time  0941    PT Time Calculation (min)  55 min    Equipment Utilized During Treatment  Gait belt    Activity Tolerance  Patient tolerated treatment well    Behavior During Therapy  [facility] for tasks assessed/performed       Past Medical History:  Diagnosis Date  . Chronic pain   . Coronary artery disease   . Diabetes mellitus without complication ([place])   . DJD (degenerative joint disease)   . GERD (gastroesophageal reflux disease)   . Hyperlipemia   . Hypertension   . Insomnia   . Neuropathy     Past Surgical History:  Procedure Laterality Date  . BACK SURGERY    . CARDIAC SURGERY    . CARDIOVERSION N/A 05/04/2017   Procedure: CARDIOVERSION;  Surgeon: Corey Skains, MD;  Location: [facility] ORS;  Service: Cardiovascular;  Laterality: N/A;  . JOINT REPLACEMENT    . TEE WITHOUT CARDIOVERSION N/A 10/08/2016   Procedure: TRANSESOPHAGEAL ECHOCARDIOGRAM (TEE);  Surgeon: Teodoro Spray, MD;  Location: [facility] ORS;  Service: Cardiovascular;  Laterality: N/A;  . TEE WITHOUT CARDIOVERSION N/A 11/23/2016   Procedure: TRANSESOPHAGEAL ECHOCARDIOGRAM (TEE);  Surgeon: Isaias Cowman, MD;  Location: [facility] ORS;  Service: Cardiovascular;  Laterality: N/A;    Vitals:   11/02/17 0857 11/02/17 0904 11/02/17 0907  BP: (!) 152/68 (!) 120/54 139/64 patient reports feeling "wobbly" and needed [place] for support    Subjective Assessment - 11/02/17 0844    Subjective  Patient reports that his heel ulcer continues to be healing well.  Patient reports that he is shopping around for a rollator walker and plans to purchase one. Patient reports that he is still having difficulty with his breathing and he states he has an appointment this afternoon with Dr. Doy Hutching.     Pertinent History  Pt reports that he had 2 hospital visits for endocarditis starting in December 2017 and then January 2018. 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 He started having dizziness at that time. Symptoms have persisted since that time without improvement or worsening. He has a prolonged history of bilateral LE neuropathy over the course of multiple years. 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 He has a-fib, CAD, hyperlipidemia, and HTN. History of alcoholism in the past. 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 He reports recent hypotension and his BP medications have been modified. When asked about his symptoms pt reports  "It feels like I lose feeling in my head" Describes feeling "woozy." Denies vertigo. Reports feeling lightheaded like he might pass out. He had a syncopal episode 1.5 months ago. He has a visit with his cardiologist next week.  Limitations  Walking    Diagnostic tests  MRI brain 27/27/18: acute CVA superior L parietal and right external capsule as well as chronic ischemia. CTA head/neck: multiple areas of severe stenosis    Patient Stated Goals  Decrease symptoms and return to leisure activities    Currently in Pain?  No/denies       Therapeutic Exercise:  Patient's orthostatic vital signs obtained this date as follows:  Supine 152/68 mmHg  Sitting 120/54 mmHg  Standing 139/64 mmHg patient reported feeling "wobbly" and needed SPC for support  Performed Biodex tower cable system  machine walking forward with 15# weight with use of rollator walker 2 reps of 20' and oxygen sats decreased to 82-86% but improved with rest break. Then, patient repeated walking forward with 15# weight with use of rollator walker 6 reps of 20'. Patient required a sitting rest break and patient's oxygen saturation level was 82% and heart rate was 101 bpm. Patient required several minutes sitting rest break in order for oxygen saturation levels to increase to 90s. Encouraged patient to do pursed lip breathing while walking and repeated attempts at forward resisted walking. Patient performed 11 reps while oxygen sats and HR were being monitored and patient's oxygen saturation levels fluctuated in the 90s.  Seated leg press: Patient had to do 3 sit to stand reps from low seat position in order to adjust the seat properly and the effort of 3 sit to stands required patient to take a standing several minute rest break secondary to shortness of breath and unsteadiness. Performed seated leg press #130 and at rest patients oxygen saturation level was 99% and HR was 67 bpm. Patient performed 5 reps and his oxygen sat was 97% and HR was 69 bpm with encouraged pursed lip breathing with blowing out through puckered lips as he extended his legs. After completing 5 reps and resting 2 minutes his oxygen decreased to 93% and HR was 53 minutes. Patient then performed 10 reps leg press at #130 and was able to maintain oxygen sats in 90s.      PT Education - 11/02/17 0844    Education provided  Yes    Education Details  reviewed pursed lip breathing, practiced using rollator walker     Person(s) Educated  Patient    Methods  Explanation;Demonstration    Comprehension  Verbalized understanding;Returned demonstration       PT Short Term Goals - 10/05/17 1044      PT SHORT TERM GOAL #1   Title  Pt will be independent with HEP in order to improve strength and balance in order to decrease fall risk and improve function  at home and with leisure activities    Time  6    Period  Weeks    Status  Achieved        PT Long Term Goals - 10/05/17 1044      PT LONG TERM GOAL #1   Title  Pt will improve DGI by at least 3 points in order to demonstrate clinically significant improvement in balance and decreased risk for falls     Baseline  07/09/17: 10/24; 08/10/2017 scored 17/24; scored 19/24 on 10/05/18    Time  6    Period  Weeks    Status  Achieved      PT LONG TERM GOAL #2   Title  Pt will improve ABC by at least 13% in order to demonstrate clinically significant improvement in balance confidence.     Baseline  07/09/17: 40.9%; on 08/10/17 scored 48.8%; scored 56.8% on 10/05/17    Time  6    Period  Weeks    Status  Achieved      PT LONG TERM GOAL #3   Title  Pt will decrease DHI score by at least 18 points in order to demonstrate clinically significant reduction in disability     Baseline  07/09/17: 60/100; on 08/10/17 scored 64/100; scored 56/100 on 10/05/17    Time  6    Period  Weeks    Status  Partially Met      PT LONG TERM GOAL #4   Title  Pt will improve Berg Balance score by at least 10 points to demonstrate decreased risk of falling.      Baseline  07/13/17: 31/56; on 08/10/17 scored 39/56; scored 47/56 on 10/05/17    Time  4    Period  Weeks    Status  Achieved      PT LONG TERM GOAL #5   Title  Patient will demonstrate reduced falls risk as evidenced by Dynamic Gait Index (DGI) >19/24.    Baseline  scored 17/24 on 08/11/2017; scored 19/24 on 10/05/17    Time  8    Period  Weeks    Status  Partially Met      PT LONG TERM GOAL #6   Title  Patient will reduce falls risk as indicated by Activities Specific Balance Confidence Scale (ABC) >67%.    Baseline  scored 48% on 10/24 and scored 56.8% on 10/05/17    Time  8    Period  Weeks    Status  Partially Met            Plan - 11/02/17 0845    Clinical Impression Statement  Patient continues to report that he has been  experiencing sudden onset shortness of breath with limited activity. Patient's orthostatic vital signs were taken this daet and patient did have a drop from 152/68 mmHG in supine to 120/54 mmHg upon sitting up. Upon standing his blood pressure was 139/64 mmHg and patient reported feeling "wobbly" and needed use of SPC for support. Patient required several sitting and standing rest breaks during session this date secondary to shortness of breath with limited activity. Patient's oxygen saturation levels decreased to 82-86% after ambulating forwards/backwards 20' times 2 laps and patient required a sitting rest break with encouraged pursed lip breathing to increase oxygen levels to the 90s. Patient reports that he did make an appointment with Dr. Doy Hutching as recommended and he is going for the appointment this afternoon. Encouraged patient to continue totake rest breaks as needed, continue monitoring his vital signs and encouraged pursed lip breathing technique with activity. Patient would benefit from continued PT services to further address goals and to try to reduce falls risk.      Rehab Potential  Fair    PT Frequency  1x / week    PT Duration  6 weeks    PT Treatment/Interventions  ADLs/Self Care Home Management;Aquatic Therapy;Canalith Repostioning;Moist Heat;Gait training;Stair training;Functional mobility training;Therapeutic activities;Therapeutic exercise;DME Instruction;Balance training;Neuromuscular re-education;Patient/family education;Manual techniques;Energy conservation;Vestibular;Visual/perceptual remediation/compensation;Cryotherapy    PT Next Visit Plan  consider doing 10 minute walking test and working on LE strengthening next session, Biodex tower walking, leg press    PT Home Exercise Plan  VOR x 1 horizontal in sitting with back support 1 min x 3, 4x/day; Sit<>stand with use of BUE, semitandem balance with horizontal head turns, sitting ball pass with head/eye  follow with conflicting  background    Consulted and Agree with Plan of Care  Patient       Patient will benefit from skilled therapeutic intervention in order to improve the following deficits and impairments:  Abnormal gait, Decreased balance, Decreased safety awareness, Decreased strength, Dizziness  Visit Diagnosis: Dizziness and giddiness  Unsteadiness on feet  Repeated falls     Problem List Patient Active Problem List   Diagnosis Date Noted  . TIA (transient ischemic attack) 12/15/2016  . CVA (cerebral vascular accident) ([place]) 12/15/2016  . Septic shock ([place]) 11/19/2016  . Pressure injury of skin 10/03/2016  . Sepsis ([place]) 10/02/2016   Lady Deutscher PT, DPT [phone] Lady Deutscher 11/02/2017, 11:42 AM  [place] MAIN [facility] SERVICES [address] [place], [place], [address] Phone: [phone]   Fax:  [phone]  Name: BREYON SIGG MRN: [id number] Date of Birth: 1946-09-18

## 2017-11-02 NOTE — ED Notes (Signed)
Attempted to call report, nurse on floor to call back.

## 2017-11-02 NOTE — H&P (Signed)
[facility] [place] [place] NAME: Gryffin Altice    MR#:  [id number]  DATE OF BIRTH:  23-Apr-1946  DATE OF ADMISSION:  11/02/2017  PRIMARY CARE PHYSICIAN: Idelle Crouch, MD   REQUESTING/REFERRING PHYSICIAN: Dr. Rip Harbour.  CHIEF COMPLAINT:   Chief Complaint  Patient presents with  . Low Hgb per PCP   Dizziness and generalized weakness for 1 week.  Low hemoglobin. HISTORY OF PRESENT ILLNESS:  Beckam Abdulaziz  is a 72 y.o. male with a known history of multiple medical problems as below.  The patient was sent from PCPs office to the ED due to her low hemoglobin at 6.9 today.  The patient has had a dizziness and generalized weakness for the past 1 week.  He denies any active bleeding, no melena, bloody stool or bloody urine.  He is on Coumadin for A. fib.  Dr. Rip Harbour said stool occult is negative and he requested admission due to symptomatic anemia. PAST MEDICAL HISTORY:   Past Medical History:  Diagnosis Date  . Chronic pain   . Coronary artery disease   . Diabetes mellitus without complication ([place])   . DJD (degenerative joint disease)   . GERD (gastroesophageal reflux disease)   . Hyperlipemia   . Hypertension   . Insomnia   . Neuropathy     PAST SURGICAL HISTORY:   Past Surgical History:  Procedure Laterality Date  . BACK SURGERY    . CARDIAC SURGERY    . CARDIOVERSION N/A 05/04/2017   Procedure: CARDIOVERSION;  Surgeon: Corey Skains, MD;  Location: [facility] ORS;  Service: Cardiovascular;  Laterality: N/A;  . JOINT REPLACEMENT    . TEE WITHOUT CARDIOVERSION N/A 10/08/2016   Procedure: TRANSESOPHAGEAL ECHOCARDIOGRAM (TEE);  Surgeon: Teodoro Spray, MD;  Location: [facility] ORS;  Service: Cardiovascular;  Laterality: N/A;  . TEE WITHOUT CARDIOVERSION N/A 11/23/2016   Procedure: TRANSESOPHAGEAL ECHOCARDIOGRAM (TEE);  Surgeon: Isaias Cowman, MD;  Location: [facility] ORS;  Service: Cardiovascular;  Laterality: N/A;    SOCIAL HISTORY:    Social History   Tobacco Use  . Smoking status: Never Smoker  . Smokeless tobacco: Never Used  Substance Use Topics  . Alcohol use: No    FAMILY HISTORY:   Family History  Problem Relation Age of Onset  . Stroke Mother   . Heart attack Mother   . Heart attack Father     DRUG ALLERGIES:   Allergies  Allergen Reactions  . Tetracyclines & Related Hives    REVIEW OF SYSTEMS:   Review of Systems  Constitutional: Positive for malaise/fatigue. Negative for chills and fever.  HENT: Negative for sore throat.   Eyes: Negative for blurred vision and double vision.  Respiratory: Positive for shortness of breath. Negative for cough, hemoptysis, sputum production, wheezing and stridor.   Cardiovascular: Negative for chest pain, palpitations, orthopnea and leg swelling.  Gastrointestinal: Negative for abdominal pain, blood in stool, diarrhea, melena, nausea and vomiting.  Genitourinary: Negative for dysuria, flank pain and hematuria.  Musculoskeletal: Negative for back pain and joint pain.  Neurological: Positive for weakness. Negative for dizziness, sensory change, focal weakness, seizures, loss of consciousness and headaches.  Endo/Heme/Allergies: Negative for polydipsia.  Psychiatric/Behavioral: Negative for depression. The patient is not nervous/anxious.     MEDICATIONS AT HOME:   Prior to Admission medications   Medication Sig Start Date End Date Taking? Authorizing Provider  amiodarone (PACERONE) 200 MG tablet Take 1 tablet (200 mg total) by mouth daily. 10/09/16  Yes Fritzi Mandes, MD  aspirin EC 81 MG tablet Take 81 mg by mouth daily.    Yes [provider]  cyclobenzaprine (FLEXERIL) 10 MG tablet Take 10 mg by mouth daily as needed for muscle spasms.    Yes [provider]  DULoxetine (CYMBALTA) 30 MG capsule Take 30 mg by mouth every evening.   Yes [provider]  feeding supplement, ENSURE ENLIVE, (ENSURE ENLIVE) LIQD Take 237 mLs by mouth 2  (two) times daily between meals. 10/09/16  Yes Fritzi Mandes, MD  furosemide (LASIX) 40 MG tablet Take 40 mg by mouth daily.  03/19/16  Yes [provider]  gabapentin (NEURONTIN) 600 MG tablet Take 600 mg by mouth 2 (two) times daily.   Yes [provider]  lisinopril (PRINIVIL,ZESTRIL) 10 MG tablet Take 10 mg by mouth daily.   Yes [provider]  loratadine (CLARITIN) 10 MG tablet Take 10 mg by mouth daily.   Yes [provider]  metFORMIN (GLUCOPHAGE) 1000 MG tablet Take 1,000 mg by mouth 2 (two) times daily. With meals. 03/19/16  Yes [provider]  methadone (DOLOPHINE) 5 MG tablet Take 5 mg by mouth 2 (two) times daily.  09/02/16  Yes [provider]  metoprolol tartrate (LOPRESSOR) 25 MG tablet Take 25 mg by mouth 2 (two) times daily.  07/20/16  Yes [provider]  Multiple Vitamin (MULTI-VITAMINS) TABS Take 1 tablet by mouth daily.   Yes [provider]  pravastatin (PRAVACHOL) 40 MG tablet Take 40 mg by mouth every evening.  02/10/16  Yes [provider]  warfarin (COUMADIN) 5 MG tablet Take 2 tablets (10 mg total) by mouth daily at 6 PM. Patient taking differently: Take 5 mg by mouth every evening.  12/18/16  Yes Henreitta Leber, MD      VITAL SIGNS:  Blood pressure (!) 147/68, pulse 66, temperature 98.3 F (36.8 C), temperature source Oral, height 5\' 10"  (1.778 m), weight 264 lb (119.7 kg), SpO2 100 %.  PHYSICAL EXAMINATION:  Physical Exam  GENERAL:  72 y.o.-year-old patient lying in the bed with no acute distress.  Obesity. EYES: Pupils equal, round, reactive to light and accommodation. No scleral icterus. Extraocular muscles intact.  HEENT: Head atraumatic, normocephalic. Oropharynx and nasopharynx clear.  NECK:  Supple, no jugular venous distention. No thyroid enlargement, no tenderness.  LUNGS: Normal breath sounds bilaterally, no wheezing, rales,rhonchi or crepitation. No use of accessory muscles of  respiration.  CARDIOVASCULAR: S1, S2 normal. No murmurs, rubs, or gallops.  ABDOMEN: Soft, nontender, nondistended. Bowel sounds present. No organomegaly or mass.  EXTREMITIES: No pedal edema, cyanosis, or clubbing.  NEUROLOGIC: Cranial nerves II through XII are intact. Muscle strength 5/5 in all extremities. Sensation intact. Gait not checked.  PSYCHIATRIC: The patient is alert and oriented x 3.  SKIN: No obvious rash, lesion, or ulcer.   LABORATORY PANEL:   CBC Recent Labs  Lab 11/02/17 1800  WBC 7.6  HGB 6.9*  HCT 22.8*  PLT 249   ------------------------------------------------------------------------------------------------------------------  Chemistries  Recent Labs  Lab 11/02/17 1800  NA 138  K 3.4*  CL 101  CO2 25  GLUCOSE 137*  BUN 25*  CREATININE 1.18  CALCIUM 8.6*   ------------------------------------------------------------------------------------------------------------------  Cardiac Enzymes No results for input(s): TROPONINI in the last 168 hours. ------------------------------------------------------------------------------------------------------------------  RADIOLOGY:  No results found.    IMPRESSION AND PLAN:   Symptomatic anemia. The patient will be placed for observation. Hold Coumadin for now, PRBC transfusion 1 unit and  follow-up hemoglobin in a.m.  Dehydration.  Hold Lasix and give gentle rehydration. Hypokalemia.  Give potassium supplement and follow-up level. History of chronic A. fib.  Continue amiodarone and Lopressor.  Hold Coumadin for now.  Diabetes.  Start sliding scale and hold the p.o. diabetes medication.  CAD.  Continue aspirin and statin. All the records are reviewed and case discussed with ED provider. Management plans discussed with the patient, his wife and they are in agreement.  CODE STATUS: Full code  TOTAL TIME TAKING CARE OF THIS PATIENT: 53 minutes.    Demetrios Loll M.D on 11/02/2017 at 7:41 PM  Between 7am  to 6pm - Pager - [phone]  After 6pm go to [email or web address] - Proofreader  [employer] [facility]  Office  [phone]  CC: Primary care physician; Idelle Crouch, MD   Note: This dictation was prepared with Dragon dictation along with smaller phrase technology. Any transcriptional errors that result from this process are unin

## 2017-11-02 NOTE — ED Notes (Signed)
Patient transported to 144

## 2017-11-02 NOTE — ED Triage Notes (Signed)
PT sent to ED pt Dr. Doy Hutching after having blood tested today. Dr, Doy Hutching called pt and reported he had a hgb in the 6's and needed to come to the ED.   Pt has been reporting dizziness and SOB that has worsened this week. No blood noted in stool. Pt denies having had anemia in the past.

## 2017-11-02 NOTE — ED Provider Notes (Addendum)
[facility] Emergency Department Provider Note   ____________________________________________   First MD Initiated Contact with Patient 11/02/17 1859     (approximate)  I have reviewed the triage vital signs and the nursing notes.   HISTORY  Chief Complaint Low Hgb per PCP   HPI [name] is a 72 y.o. male Sent from Dr. Doy Hutching office for low blood count. He reports he's been lightheaded and short of breath since Christmas is been getting worse. He has no known blood loss. Nothing hurts him. He has no black tarry stools.   Past Medical History:  Diagnosis Date  . Chronic pain   . Coronary artery disease   . Diabetes mellitus without complication ([place])   . DJD (degenerative joint disease)   . GERD (gastroesophageal reflux disease)   . Hyperlipemia   . Hypertension   . Insomnia   . Neuropathy     Patient Active Problem List   Diagnosis Date Noted  . Anemia 11/02/2017  . TIA (transient ischemic attack) 12/15/2016  . CVA (cerebral vascular accident) ([place]) 12/15/2016  . Septic shock ([place]) 11/19/2016  . Pressure injury of skin 10/03/2016  . Sepsis ([place]) 10/02/2016    Past Surgical History:  Procedure Laterality Date  . BACK SURGERY    . CARDIAC SURGERY    . CARDIOVERSION N/A 05/04/2017   Procedure: CARDIOVERSION;  Surgeon: Corey Skains, MD;  Location: [facility] ORS;  Service: Cardiovascular;  Laterality: N/A;  . JOINT REPLACEMENT    . TEE WITHOUT CARDIOVERSION N/A 10/08/2016   Procedure: TRANSESOPHAGEAL ECHOCARDIOGRAM (TEE);  Surgeon: Teodoro Spray, MD;  Location: [facility] ORS;  Service: Cardiovascular;  Laterality: N/A;  . TEE WITHOUT CARDIOVERSION N/A 11/23/2016   Procedure: TRANSESOPHAGEAL ECHOCARDIOGRAM (TEE);  Surgeon: Isaias Cowman, MD;  Location: [facility] ORS;  Service: Cardiovascular;  Laterality: N/A;    Prior to Admission medications   Medication Sig Start Date End Date Taking? Authorizing Provider  amiodarone (PACERONE) 200  MG tablet Take 1 tablet (200 mg total) by mouth daily. 10/09/16  Yes Fritzi Mandes, MD  aspirin EC 81 MG tablet Take 81 mg by mouth daily.    Yes [provider]  cyclobenzaprine (FLEXERIL) 10 MG tablet Take 10 mg by mouth daily as needed for muscle spasms.    Yes [provider]  DULoxetine (CYMBALTA) 30 MG capsule Take 30 mg by mouth every evening.   Yes [provider]  feeding supplement, ENSURE ENLIVE, (ENSURE ENLIVE) LIQD Take 237 mLs by mouth 2 (two) times daily between meals. 10/09/16  Yes Fritzi Mandes, MD  furosemide (LASIX) 40 MG tablet Take 40 mg by mouth daily.  03/19/16  Yes [provider]  gabapentin (NEURONTIN) 600 MG tablet Take 600 mg by mouth 2 (two) times daily.   Yes [provider]  lisinopril (PRINIVIL,ZESTRIL) 10 MG tablet Take 10 mg by mouth daily.   Yes [provider]  loratadine (CLARITIN) 10 MG tablet Take 10 mg by mouth daily.   Yes [provider]  metFORMIN (GLUCOPHAGE) 1000 MG tablet Take 1,000 mg by mouth 2 (two) times daily. With meals. 03/19/16  Yes [provider]  methadone (DOLOPHINE) 5 MG tablet Take 5 mg by mouth 2 (two) times daily.  09/02/16  Yes [provider]  metoprolol tartrate (LOPRESSOR) 25 MG tablet Take 25 mg by mouth 2 (two) times daily.  07/20/16  Yes [provider]  Multiple Vitamin (MULTI-VITAMINS) TABS Take 1 tablet by mouth daily.   Yes  [provider]  pravastatin (PRAVACHOL) 40 MG tablet Take 40 mg by mouth every evening.  02/10/16  Yes [provider]  warfarin (COUMADIN) 5 MG tablet Take 2 tablets (10 mg total) by mouth daily at 6 PM. Patient taking differently: Take 5 mg by mouth every evening.  12/18/16  Yes Henreitta Leber, MD    Allergies Tetracyclines & related  Family History  Problem Relation Age of Onset  . Stroke Mother   . Heart attack Mother   . Heart attack Father     Social History Social History   Tobacco Use  .  Smoking status: Never Smoker  . Smokeless tobacco: Never Used  Substance Use Topics  . Alcohol use: No  . Drug use: No    Review of Systems  Constitutional: No fever/chills Eyes: No visual changes. ENT: No sore throat. Cardiovascular: Denies chest pain. Respiratory: some shortness of breath. Gastrointestinal: No abdominal pain.  No nausea, no vomiting.  No diarrhea.  No constipation. Genitourinary: Negative for dysuria. Musculoskeletal: Negative for back pain. Skin: Negative for rash. Neurological: Negative for headaches, focal weakness   ____________________________________________   PHYSICAL EXAM:  VITAL SIGNS: ED Triage Vitals [11/02/17 1749]  Enc Vitals Group     BP (!) 147/68     Pulse Rate 66     Resp      Temp 98.3 F (36.8 C)     Temp Source Oral     SpO2 100 %     Weight 264 lb (119.7 kg)     Height 5\' 10"  (1.778 m)     Head Circumference      Peak Flow      Pain Score      Pain Loc      Pain Edu?      Excl. in [place]?     Constitutional: Alert and oriented. Well appearing and in no acute distress. Eyes: Conjunctivae are normal.  Head: Atraumatic. Nose: No congestion/rhinnorhea. Mouth/Throat: Mucous membranes are moist.  Oropharynx non-erythematous. Neck: No stridor.  Cardiovascular: Normal rate, regular rhythm. Grossly normal heart sounds.  Good peripheral circulation. Respiratory: Normal respiratory effort.  No retractions. Lungs CTAB. Gastrointestinal: Soft and nontender. No distention. No abdominal bruits. No CVA tenderness. rectal: Hemoccult negative there is a skin tag on the left anterior side of the rectum probably an old hemorrhoidal tag }Musculoskeletal: No lower extremity tenderness nor edema.  No joint effusions. Neurologic:  Normal speech and language. No gross focal neurologic deficits are appreciated.  Skin:  Skin is warm, dry and intact. No rash noted. Psychiatric: Mood and affect are normal. Speech and behavior are  normal.  ____________________________________________   LABS (all labs ordered are listed, but only abnormal results are displayed)  Labs Reviewed  BASIC METABOLIC PANEL - Abnormal; Notable for the following components:      Result Value   Potassium 3.4 (*)    Glucose, Bld 137 (*)    BUN 25 (*)    Calcium 8.6 (*)    All other components within normal limits  CBC - Abnormal; Notable for the following components:   RBC 3.08 (*)    Hemoglobin 6.9 (*)    HCT 22.8 (*)    MCV 74.0 (*)    MCH 22.5 (*)    MCHC 30.5 (*)    RDW 23.2 (*)    All other components within normal limits  PROTIME-INR - Abnormal; Notable for the following components:   Prothrombin Time 17.9 (*)  All other components within normal limits  GLUCOSE, CAPILLARY - Abnormal; Notable for the following components:   Glucose-Capillary 199 (*)    All other components within normal limits  MAGNESIUM  HEMOGLOBIN  BASIC METABOLIC PANEL  OCCULT BLOOD X 1 CARD TO LAB, STOOL  TYPE AND SCREEN  TYPE AND SCREEN  PREPARE RBC (CROSSMATCH)  ABO/RH   ____________________________________________  EKG  EKG read and interpreted by me shows normal sinus rhythm rate of 70 normal axis nonspecific ST-T wave changes ____________________________________________  RADIOLOGY    ____________________________________________   PROCEDURES  Procedure(s) performed:   Procedures  Critical Care performed:   ____________________________________________   INITIAL IMPRESSION / ASSESSMENT AND PLAN / ED COURSE  patient is having symptomatic anemia will plan on admitting him for transfusion and further workup.      ____________________________________________   FINAL CLINICAL IMPRESSION(S) / ED DIAGNOSES  Final diagnoses:  Symptomatic anemia     ED Discharge Orders    None       Note:  This document was prepared using Dragon voice recognition software and may include unintentional dictation errors.     Nena Polio, MD 11/03/17 0009    Nena Polio, MD 11/03/17 0010

## 2017-11-03 DIAGNOSIS — E876 Hypokalemia: Secondary | ICD-10-CM | POA: Diagnosis not present

## 2017-11-03 DIAGNOSIS — I4891 Unspecified atrial fibrillation: Secondary | ICD-10-CM | POA: Diagnosis not present

## 2017-11-03 DIAGNOSIS — D649 Anemia, unspecified: Secondary | ICD-10-CM | POA: Diagnosis not present

## 2017-11-03 DIAGNOSIS — I2510 Atherosclerotic heart disease of native coronary artery without angina pectoris: Secondary | ICD-10-CM | POA: Diagnosis not present

## 2017-11-03 LAB — IRON AND TIBC
IRON: 24 ug/dL — AB (ref 45–182)
Saturation Ratios: 5 % — ABNORMAL LOW (ref 17.9–39.5)
TIBC: 531 ug/dL — AB (ref 250–450)
UIBC: 507 ug/dL

## 2017-11-03 LAB — GLUCOSE, CAPILLARY
Glucose-Capillary: 128 mg/dL — ABNORMAL HIGH (ref 65–99)
Glucose-Capillary: 125 mg/dL — ABNORMAL HIGH (ref 65–99)
Glucose-Capillary: 89 mg/dL (ref 65–99)

## 2017-11-03 LAB — BASIC METABOLIC PANEL WITH GFR
Anion gap: 11 (ref 5–15)
BUN: 21 mg/dL — ABNORMAL HIGH (ref 6–20)
CO2: 24 mmol/L (ref 22–32)
Calcium: 8.5 mg/dL — ABNORMAL LOW (ref 8.9–10.3)
Chloride: 104 mmol/L (ref 101–111)
Creatinine, Ser: 1.03 mg/dL (ref 0.61–1.24)
GFR calc Af Amer: >60 mL/min
GFR calc non Af Amer: >60 mL/min
Glucose, Bld: 138 mg/dL — ABNORMAL HIGH (ref 65–99)
Potassium: 4.1 mmol/L (ref 3.5–5.1)
Sodium: 139 mmol/L (ref 135–145)

## 2017-11-03 LAB — CBC
HCT: 25.1 % — ABNORMAL LOW (ref 40.0–52.0)
Hemoglobin: 7.6 g/dL — ABNORMAL LOW (ref 13.0–18.0)
MCH: 23.1 pg — ABNORMAL LOW (ref 26.0–34.0)
MCHC: 30.5 g/dL — ABNORMAL LOW (ref 32.0–36.0)
MCV: 75.9 fL — ABNORMAL LOW (ref 80.0–100.0)
Platelets: 229 K/uL (ref 150–440)
RBC: 3.3 MIL/uL — ABNORMAL LOW (ref 4.40–5.90)
RDW: 22.5 % — ABNORMAL HIGH (ref 11.5–14.5)
WBC: 5.9 K/uL (ref 3.8–10.6)

## 2017-11-03 LAB — HEMOGLOBIN AND HEMATOCRIT, BLOOD
HEMATOCRIT: 25.8 % — AB (ref 40.0–52.0)
HEMOGLOBIN: 8 g/dL — AB (ref 13.0–18.0)

## 2017-11-03 LAB — HEMOGLOBIN: Hemoglobin: 7.3 g/dL — ABNORMAL LOW (ref 13.0–18.0)

## 2017-11-03 LAB — RETICULOCYTES
RBC.: 3.23 MIL/uL — ABNORMAL LOW (ref 4.40–5.90)
Retic Count, Absolute: 100.1 K/uL (ref 19.0–183.0)
Retic Ct Pct: 3.1 % (ref 0.4–3.1)

## 2017-11-03 LAB — FOLATE: Folate: 33 ng/mL

## 2017-11-03 LAB — VITAMIN B12: VITAMIN B 12: 282 pg/mL (ref 180–914)

## 2017-11-03 LAB — PREPARE RBC (CROSSMATCH)

## 2017-11-03 LAB — FERRITIN: Ferritin: 6 ng/mL — ABNORMAL LOW (ref 24–336)

## 2017-11-03 MED ORDER — FERROUS SULFATE 325 (65 FE) MG PO TABS: 325.0000 mg | ORAL_TABLET | Freq: Two times a day (BID) | ORAL | Status: DC

## 2017-11-03 MED ORDER — FERROUS SULFATE 325 (65 FE) MG PO TABS: 325.0000 mg | ORAL_TABLET | Freq: Two times a day (BID) | ORAL | 3 refills | Status: AC

## 2017-11-03 MED ORDER — SODIUM CHLORIDE 0.9 % IV SOLN
Freq: Once | INTRAVENOUS | Status: AC
  Administered 2017-11-03: 17:00:00 via INTRAVENOUS

## 2017-11-03 NOTE — Progress Notes (Signed)
Reviewed lab results with MD and patient ok to discharge home with wife With orders to stop taking Warfarin and ASA, along with started Iron.  Patient to follow-up with PCP. IV removed with cath intact. Allowed time for questions.

## 2017-11-03 NOTE — Care Management Obs Status (Signed)
[facility] NOTIFICATION   Patient Details  Name: [name] MRN: [id number] Date of Birth: February 10, 1946   Medicare Observation Status Notification Given:  Yes    Jolly Mango, RN 11/03/2017, 11:19 AM

## 2017-11-03 NOTE — Progress Notes (Signed)
[facility] [place] NAME: [name]    MR#:  [id number]  DATE OF BIRTH:  [birth year]  SUBJECTIVE:  CHIEF COMPLAINT:   Chief Complaint  Patient presents with  . Low Hgb per PCP   -Feels about the same. Hemoglobin at 7.3 after 1 unit transfusion. -For second unit today.  REVIEW OF SYSTEMS:  Review of Systems  Constitutional: Positive for malaise/fatigue. Negative for chills and fever.  HENT: Negative for congestion, ear discharge, hearing loss and nosebleeds.   Eyes: Negative for blurred vision and double vision.  Respiratory: Positive for shortness of breath. Negative for cough and wheezing.   Cardiovascular: Negative for chest pain, palpitations and leg swelling.  Gastrointestinal: Negative for abdominal pain, constipation, diarrhea, nausea and vomiting.  Genitourinary: Negative for dysuria and urgency.  Musculoskeletal: Negative for myalgias.  Neurological: Negative for dizziness, speech change, focal weakness, seizures and headaches.  Psychiatric/Behavioral: Negative for depression.    DRUG ALLERGIES:   Allergies  Allergen Reactions  . Tetracyclines & Related Hives    VITALS:  Blood pressure 135/60, pulse 70, temperature 98.3 F (36.8 C), temperature source Oral, resp. rate 18, height 5\' 10"  (1.778 m), weight 119.8 kg (264 lb 1.6 oz), SpO2 95 %.  PHYSICAL EXAMINATION:  Physical Exam  GENERAL:  72 y.o.-year-old obese patient lying in the bed with no acute distress.  EYES: Pupils equal, round, reactive to light and accommodation. No scleral icterus. Extraocular muscles intact.  HEENT: Head atraumatic, normocephalic. Oropharynx and nasopharynx clear.  NECK:  Supple, no jugular venous distention. No thyroid enlargement, no tenderness.  LUNGS: Normal breath sounds bilaterally, no wheezing, rales,rhonchi or crepitation. No use of accessory muscles of respiration.  CARDIOVASCULAR: S1, S2 normal. No  rubs, or gallops. 3/6  systolic murmur present ABDOMEN: Soft, nontender, nondistended. Bowel sounds present. No organomegaly or mass.  EXTREMITIES: No pedal edema, cyanosis, or clubbing.  NEUROLOGIC: Cranial nerves II through XII are intact. Muscle strength 5/5 in all extremities. Sensation intact. Gait not checked.  PSYCHIATRIC: The patient is alert and oriented x 3.  SKIN: No obvious rash, lesion, or ulcer.    LABORATORY PANEL:   CBC Recent Labs  Lab 11/02/17 1800 11/03/17 0612  WBC 7.6  --   HGB 6.9* 7.3*  HCT 22.8*  --   PLT 249  --    ------------------------------------------------------------------------------------------------------------------  Chemistries  Recent Labs  Lab 11/02/17 1800 11/03/17 0612  NA 138 139  K 3.4* 4.1  CL 101 104  CO2 25 24  GLUCOSE 137* 138*  BUN 25* 21*  CREATININE 1.18 1.03  CALCIUM 8.6* 8.5*  MG 1.8  --    ------------------------------------------------------------------------------------------------------------------  Cardiac Enzymes No results for input(s): TROPONINI in the last 168 hours. ------------------------------------------------------------------------------------------------------------------  RADIOLOGY:  No results found.  EKG:   Orders placed or performed during the hospital encounter of 11/02/17  . ED EKG  . ED EKG    ASSESSMENT AND PLAN:   72 year old male with past medical history significant for CAD, diabetes mellitus, aortic stenosis status post bioprosthetic aortic valve replacement, A. fib on Coumadin, chronic pain syndrome, hypertension, neuropathy presents from PCPs office due to low hemoglobin.  1. Acute on chronic anemia-no blood loss noted. Unknown causes at this time. -Could be iron deficiency anemia as MCV has significantly reduced. -Baseline hemoglobin stays around 10, last month it was 12 and dropped down to 6.9. -Stool for guaiac is negative. One unit transfusion and at 7.3. Give another unit to  keep the  hemoglobin around 8 with his cardiac history. -Anemia panel ordered to see if he needs any iron infusion or replacement -Continue outpatient monitoring. Hold Coumadin for now at discharge  2. CAD-stable. On lisinopril and metoprolol. Hold aspirin with anemia at this time. No recent stents.  3. Chronic pain-continue outpatient medications. Patient on methadone  4. A. fib-continue - rate controlled on metoprolol. Rate controlled. Hold aspirin and Coumadin at discharge.  5. DVT prophylaxis-Ted's and SCDs    All the records are reviewed and case discussed with Care Management/Social Workerr. Management plans discussed with the patient, family and they are in agreement.  CODE STATUS: Full code  TOTAL TIME TAKING CARE OF THIS PATIENT: 38 minutes.   POSSIBLE D/C today, DEPENDING ON CLINICAL CONDITION.   Gladstone Lighter M.D on 11/03/2017 at 10:12 AM  Between 7am to 6pm - Pager - [phone]  After 6pm go to [email or web address] - password [email or web address]  Office  [phone]  CC: Primary care physician; Idelle Crouch, MD

## 2017-11-04 LAB — BPAM RBC
Blood Product Expiration Date: 201901282359
Blood Product Expiration Date: 201902032359
ISSUE DATE / TIME: 201901161249
ISSUE DATE / TIME: 201901152349
UNIT TYPE AND RH: 1700
UNIT TYPE AND RH: 7300

## 2017-11-04 LAB — TYPE AND SCREEN
ABO/RH(D): B POS
Antibody Screen: NEGATIVE
Unit division: 0
Unit division: 0

## 2017-11-04 NOTE — Discharge Summary (Signed)
[facility] [place] NAME: [name]    MR#:  [id number]  DATE OF BIRTH:  [birth year]  DATE OF ADMISSION:  11/02/2017   ADMITTING PHYSICIAN: Demetrios Loll, MD  DATE OF DISCHARGE: 11/03/2017  6:00 PM  PRIMARY CARE PHYSICIAN: Idelle Crouch, MD   ADMISSION DIAGNOSIS:   Symptomatic anemia [D64.9]  DISCHARGE DIAGNOSIS:   Active Problems:   Anemia   SECONDARY DIAGNOSIS:   Past Medical History:  Diagnosis Date  . Chronic pain   . Coronary artery disease   . Diabetes mellitus without complication ([employer])   . DJD (degenerative joint disease)   . GERD (gastroesophageal reflux disease)   . Hyperlipemia   . Hypertension   . Insomnia   . Neuropathy     HOSPITAL COURSE:   72 year old male with past medical history significant for CAD, diabetes mellitus, aortic stenosis status post bioprosthetic aortic valve replacement, A. fib on Coumadin, chronic pain syndrome, hypertension, neuropathy presents from PCPs office due to low hemoglobin.  1. Acute on chronic anemia-no blood loss noted.  - iron deficiency anemia- low iron and ferritin levels - received 2 units prbc transfusion after admission- hb at 8 - no active bleeding noted. -Baseline hemoglobin stays around 10 -Stool for guaiac is negative. Recommend GI f/u as outpatient for EGD and colonoscopy for iron deficiency anemia -Continue outpatient monitoring. Hold Coumadin and aspirin for now at discharge  2. CAD-stable. On lisinopril and metoprolol. Hold aspirin with anemia at this time. No recent stents.  3. Chronic pain-continue outpatient medications. Patient on methadone  4. A. fib-continue - rate controlled on metoprolol. Rate controlled. Hold aspirin and Coumadin at discharge.  Patient feels normal, asymptomatic other than dyspnea on exertion which is chronic and wants to go home. Recommend outpatient GI f/u   DISCHARGE CONDITIONS:   Guarded  CONSULTS OBTAINED:    None  DRUG ALLERGIES:   Allergies  Allergen Reactions  . Tetracyclines & Related Hives   DISCHARGE MEDICATIONS:   Allergies as of 11/03/2017      Reactions   Tetracyclines & Related Hives      Medication List    STOP taking these medications   aspirin EC 81 MG tablet   warfarin 5 MG tablet Commonly known as:  COUMADIN     TAKE these medications   amiodarone 200 MG tablet Commonly known as:  PACERONE Take 1 tablet (200 mg total) by mouth daily.   cyclobenzaprine 10 MG tablet Commonly known as:  FLEXERIL Take 10 mg by mouth daily as needed for muscle spasms.   DULoxetine 30 MG capsule Commonly known as:  CYMBALTA Take 30 mg by mouth every evening.   feeding supplement (ENSURE ENLIVE) Liqd Take 237 mLs by mouth 2 (two) times daily between meals.   ferrous sulfate 325 (65 FE) MG tablet Take 1 tablet (325 mg total) by mouth 2 (two) times daily with a meal.   furosemide 40 MG tablet Commonly known as:  LASIX Take 40 mg by mouth daily.   gabapentin 600 MG tablet Commonly known as:  NEURONTIN Take 600 mg by mouth 2 (two) times daily.   lisinopril 10 MG tablet Commonly known as:  PRINIVIL,ZESTRIL Take 10 mg by mouth daily.   loratadine 10 MG tablet Commonly known as:  CLARITIN Take 10 mg by mouth daily.   metFORMIN 1000 MG tablet Commonly known as:  GLUCOPHAGE Take 1,000 mg by mouth 2 (two) times daily. With meals.  methadone 5 MG tablet Commonly known as:  DOLOPHINE Take 5 mg by mouth 2 (two) times daily.   metoprolol tartrate 25 MG tablet Commonly known as:  LOPRESSOR Take 25 mg by mouth 2 (two) times daily.   MULTI-VITAMINS Tabs Take 1 tablet by mouth daily.   pravastatin 40 MG tablet Commonly known as:  PRAVACHOL Take 40 mg by mouth every evening.        DISCHARGE INSTRUCTIONS:   1. PCP f/u in 1 week- iron infusions as outpatient 2. GI f/u in 1 week  DIET:   Cardiac diet  ACTIVITY:   Activity as tolerated  OXYGEN:   Home  Oxygen: No.  Oxygen Delivery: room air  DISCHARGE LOCATION:   home   If you experience worsening of your admission symptoms, develop shortness of breath, life threatening emergency, suicidal or homicidal thoughts you must seek medical attention immediately by calling 911 or calling your MD immediately  if symptoms less severe.  You Must read complete instructions/literature along with all the possible adverse reactions/side effects for all the Medicines you take and that have been prescribed to you. Take any new Medicines after you have completely understood and accpet all the possible adverse reactions/side effects.   Please note  You were cared for by a hospitalist during your hospital stay. If you have any questions about your discharge medications or the care you received while you were in the hospital after you are discharged, you can call the unit and asked to speak with the hospitalist on call if the hospitalist that took care of you is not available. Once you are discharged, your primary care physician will handle any further medical issues. Please note that NO REFILLS for any discharge medications will be authorized once you are discharged, as it is imperative that you return to your primary care physician (or establish a relationship with a primary care physician if you do not have one) for your aftercare needs so that they can reassess your need for medications and monitor your lab values.    On the day of Discharge:  VITAL SIGNS:   Blood pressure (!) 117/58, pulse 64, temperature 98 F (36.7 C), temperature source Oral, resp. rate 18, height 5\' 10"  (1.778 m), weight 119.8 kg (264 lb 1.6 oz), SpO2 98 %.  PHYSICAL EXAMINATION:    GENERAL:  72 y.o.-year-old obese patient lying in the bed with no acute distress.  EYES: Pupils equal, round, reactive to light and accommodation. No scleral icterus. Extraocular muscles intact.  HEENT: Head atraumatic, normocephalic. Oropharynx and  nasopharynx clear.  NECK:  Supple, no jugular venous distention. No thyroid enlargement, no tenderness.  LUNGS: Normal breath sounds bilaterally, no wheezing, rales,rhonchi or crepitation. No use of accessory muscles of respiration.  CARDIOVASCULAR: S1, S2 normal. No  rubs, or gallops. 3/6 systolic murmur present ABDOMEN: Soft, nontender, nondistended. Bowel sounds present. No organomegaly or mass.  EXTREMITIES: No pedal edema, cyanosis, or clubbing.  NEUROLOGIC: Cranial nerves II through XII are intact. Muscle strength 5/5 in all extremities. Sensation intact. Gait not checked.  PSYCHIATRIC: The patient is alert and oriented x 3.  SKIN: No obvious rash, lesion, or ulcer.     DATA REVIEW:   CBC Recent Labs  Lab 11/03/17 0940 11/03/17 1638  WBC 5.9  --   HGB 7.6* 8.0*  HCT 25.1* 25.8*  PLT 229  --     Chemistries  Recent Labs  Lab 11/02/17 1800 11/03/17 0612  NA 138 139  K 3.4*  4.1  CL 101 104  CO2 25 24  GLUCOSE 137* 138*  BUN 25* 21*  CREATININE 1.18 1.03  CALCIUM 8.6* 8.5*  MG 1.8  --      Microbiology Results  Results for orders placed or performed during the hospital encounter of 11/18/16  Culture, blood (Routine x 2)     Status: Abnormal   Collection Time: 11/18/16 11:28 PM  Result Value Ref Range Status   Specimen Description BLOOD LEFT ANTECUBITAL  Final   Special Requests   Final    BOTTLES DRAWN AEROBIC AND ANAEROBIC  AER [facility] ANA 4CC   Culture  Setup Time   Final    GRAM POSITIVE COCCI IN BOTH AEROBIC AND ANAEROBIC BOTTLES CRITICAL VALUE NOTED.  VALUE IS CONSISTENT WITH PREVIOUSLY REPORTED AND CALLED VALUE.    Culture (A)  Final    STAPHYLOCOCCUS AUREUS SUSCEPTIBILITIES PERFORMED ON PREVIOUS CULTURE WITHIN THE LAST 5 DAYS. Performed at [facility], [place] [address], [place], [place] [address]    Report Status 11/21/2016 FINAL  Final  Culture, blood (Routine x 2)     Status: Abnormal   Collection Time: 11/18/16 11:28 PM  Result Value Ref  Range Status   Specimen Description BLOOD LEFT HAND  Final   Special Requests   Final    BOTTLES DRAWN AEROBIC AND ANAEROBIC  AER 2CC ANA [place]   Culture  Setup Time   Final    GRAM POSITIVE COCCI IN BOTH AEROBIC AND ANAEROBIC BOTTLES CRITICAL RESULT CALLED TO, READ BACK BY AND VERIFIED WITH: SHEENA HALLAJI AT 6213 ON 11/19/16 [place]. Performed at [facility], [place] [address] [place], [place] [address]    Culture STAPHYLOCOCCUS AUREUS (A)  Final   Report Status 11/21/2016 FINAL  Final   Organism ID, Bacteria STAPHYLOCOCCUS AUREUS  Final      Susceptibility   Staphylococcus aureus - MIC*    CIPROFLOXACIN <=0.5 SENSITIVE Sensitive     ERYTHROMYCIN <=0.25 SENSITIVE Sensitive     GENTAMICIN <=0.5 SENSITIVE Sensitive     OXACILLIN 0.5 SENSITIVE Sensitive     TETRACYCLINE <=1 SENSITIVE Sensitive     VANCOMYCIN 1 SENSITIVE Sensitive     TRIMETH/SULFA <=10 SENSITIVE Sensitive     CLINDAMYCIN <=0.25 SENSITIVE Sensitive     RIFAMPIN <=0.5 SENSITIVE Sensitive     Inducible Clindamycin NEGATIVE Sensitive     * STAPHYLOCOCCUS AUREUS  Urine culture     Status: Abnormal   Collection Time: 11/18/16 11:28 PM  Result Value Ref Range Status   Specimen Description URINE, RANDOM  Final   Special Requests NONE  Final   Culture (A)  Final    <10,000 COLONIES/mL INSIGNIFICANT GROWTH Performed at [facility], [place] [address] [place], [place] [address]    Report Status 11/20/2016 FINAL  Final  Blood Culture ID Panel (Reflexed)     Status: Abnormal   Collection Time: 11/18/16 11:28 PM  Result Value Ref Range Status   Enterococcus species NOT DETECTED NOT DETECTED Final   Listeria monocytogenes NOT DETECTED NOT DETECTED Final   Staphylococcus species DETECTED (A) NOT DETECTED Final    Comment: CRITICAL RESULT CALLED TO, READ BACK BY AND VERIFIED WITH: SHEENA HALLAJI AT 2952 ON 11/19/16 [place].    Staphylococcus aureus DETECTED (A) NOT DETECTED Final    Comment: Methicillin (oxacillin)  susceptible Staphylococcus aureus (MSSA). Preferred therapy is anti staphylococcal beta lactam antibiotic (Cefazolin or Nafcillin), unless clinically contraindicated. CRITICAL RESULT CALLED TO, READ BACK BY AND  VERIFIED WITH: SHEENA HALLAJI AT 8242 ON 11/19/16 [place].    Methicillin resistance NOT DETECTED NOT DETECTED Final   Streptococcus species NOT DETECTED NOT DETECTED Final   Streptococcus agalactiae NOT DETECTED NOT DETECTED Final   Streptococcus pneumoniae NOT DETECTED NOT DETECTED Final   Streptococcus pyogenes NOT DETECTED NOT DETECTED Final   Acinetobacter baumannii NOT DETECTED NOT DETECTED Final   Enterobacteriaceae species NOT DETECTED NOT DETECTED Final   Enterobacter cloacae complex NOT DETECTED NOT DETECTED Final   Escherichia coli NOT DETECTED NOT DETECTED Final   Klebsiella oxytoca NOT DETECTED NOT DETECTED Final   Klebsiella pneumoniae NOT DETECTED NOT DETECTED Final   Proteus species NOT DETECTED NOT DETECTED Final   Serratia marcescens NOT DETECTED NOT DETECTED Final   Haemophilus influenzae NOT DETECTED NOT DETECTED Final   Neisseria meningitidis NOT DETECTED NOT DETECTED Final   Pseudomonas aeruginosa NOT DETECTED NOT DETECTED Final   Candida albicans NOT DETECTED NOT DETECTED Final   Candida glabrata NOT DETECTED NOT DETECTED Final   Candida krusei NOT DETECTED NOT DETECTED Final   Candida parapsilosis NOT DETECTED NOT DETECTED Final   Candida tropicalis NOT DETECTED NOT DETECTED Final  MRSA PCR Screening     Status: None   Collection Time: 11/19/16  3:50 AM  Result Value Ref Range Status   MRSA by PCR NEGATIVE NEGATIVE Final    Comment:        The GeneXpert MRSA Assay (FDA approved for NASAL specimens only), is one component of a comprehensive MRSA colonization surveillance program. It is not intended to diagnose MRSA infection nor to guide or monitor treatment for MRSA infections.   Culture, blood (Routine X 2) w Reflex to ID Panel     Status: None    Collection Time: 11/20/16  4:22 PM  Result Value Ref Range Status   Specimen Description BLOOD LEFT ASSIST CONTROL  Final   Special Requests   Final    BOTTLES DRAWN AEROBIC AND ANAEROBIC ANA11ML AER14ML   Culture NO GROWTH 5 DAYS  Final   Report Status 11/25/2016 FINAL  Final  Culture, blood (Routine X 2) w Reflex to ID Panel     Status: None   Collection Time: 11/20/16  4:28 PM  Result Value Ref Range Status   Specimen Description BLOOD RIGHT ASSIST CONTROL  Final   Special Requests   Final    BOTTLES DRAWN AEROBIC AND ANAEROBIC AER12ML ANA11ML   Culture NO GROWTH 5 DAYS  Final   Report Status 11/25/2016 FINAL  Final    RADIOLOGY:  No results found.   Management plans discussed with the patient, family and they are in agreement.  CODE STATUS:  Code Status History    Date Active Date Inactive Code Status Order ID Comments User Context   11/02/2017 21:44 11/03/2017 21:55 Full Code [id number]  Demetrios Loll, MD Inpatient   12/15/2016 15:43 12/18/2016 18:54 Full Code [id number]  Fritzi Mandes, MD Inpatient   11/19/2016 02:02 11/24/2016 18:15 Full Code [id number]  Holley Raring, NP ED   10/02/2016 11:39 10/10/2016 20:15 Full Code [id number]  Laverle Hobby, MD ED      TOTAL TIME TAKING CARE OF THIS PATIENT: 38 minutes.    Gladstone Lighter M.D on 11/04/2017 at 6:59 AM  Between 7am to 6pm - Pager - [phone]  After 6pm go to [email or web address] - Proofreader  [facility]  Office  [phone]  CC: Primary care physician; Idelle Crouch, MD   Note: This dictation was prepared  with Dragon dictation along with smaller phrase technology. Any transcriptional errors that result from this process are unintentional.

## 2017-11-09 ENCOUNTER — Ambulatory Visit: Payer: PPO | Admitting: Physical Therapy

## 2017-11-09 ENCOUNTER — Encounter: Payer: Self-pay | Admitting: Physical Therapy

## 2017-11-09 VITALS — BP 133/59

## 2017-11-09 DIAGNOSIS — R296 Repeated falls: Secondary | ICD-10-CM

## 2017-11-09 DIAGNOSIS — D649 Anemia, unspecified: Secondary | ICD-10-CM | POA: Diagnosis not present

## 2017-11-09 DIAGNOSIS — R0602 Shortness of breath: Secondary | ICD-10-CM | POA: Diagnosis not present

## 2017-11-09 DIAGNOSIS — R2681 Unsteadiness on feet: Secondary | ICD-10-CM

## 2017-11-09 DIAGNOSIS — R42 Dizziness and giddiness: Secondary | ICD-10-CM

## 2017-11-09 DIAGNOSIS — R791 Abnormal coagulation profile: Secondary | ICD-10-CM | POA: Diagnosis not present

## 2017-11-09 NOTE — Therapy (Signed)
[place] MAIN [facility] SERVICES [address] [place], [place], [address] Phone: [phone]   Fax:  [phone]  Physical Therapy Treatment  Patient Details  Name: [name] MRN: [id number] Date of Birth: [birth year] Referring Provider: Dr. Manuella Ghazi   Encounter Date: 11/09/2017  PT End of Session - 11/09/17 1343    Visit Number  14    Number of Visits  17    Date for PT Re-Evaluation  11/16/17    PT Start Time  1300    PT Stop Time  1342    PT Time Calculation (min)  42 min    Equipment Utilized During Treatment  Gait belt    Activity Tolerance  Patient tolerated treatment well    Behavior During Therapy  [facility] for tasks assessed/performed       Past Medical History:  Diagnosis Date  . Chronic pain   . Coronary artery disease   . Diabetes mellitus without complication ([place])   . DJD (degenerative joint disease)   . GERD (gastroesophageal reflux disease)   . Hyperlipemia   . Hypertension   . Insomnia   . Neuropathy     Past Surgical History:  Procedure Laterality Date  . BACK SURGERY    . CARDIAC SURGERY    . CARDIOVERSION N/A 05/04/2017   Procedure: CARDIOVERSION;  Surgeon: Corey Skains, MD;  Location: [facility] ORS;  Service: Cardiovascular;  Laterality: N/A;  . JOINT REPLACEMENT    . TEE WITHOUT CARDIOVERSION N/A 10/08/2016   Procedure: TRANSESOPHAGEAL ECHOCARDIOGRAM (TEE);  Surgeon: Teodoro Spray, MD;  Location: [facility] ORS;  Service: Cardiovascular;  Laterality: N/A;  . TEE WITHOUT CARDIOVERSION N/A 11/23/2016   Procedure: TRANSESOPHAGEAL ECHOCARDIOGRAM (TEE);  Surgeon: Isaias Cowman, MD;  Location: [facility] ORS;  Service: Cardiovascular;  Laterality: N/A;    Vitals:   11/09/17 1307  BP: (!) 133/59    Subjective Assessment - 11/09/17 1304    Subjective  Patient reports he is getting an echocardiogram at 2pm toay as well as follow-up blood work. Patient states he was admitted to the hospital for blood last week and that he  feels much better now. Patient reports that his hemoglobin level was 6.8 prior to the blood transfusion. Patient feels his energy level is "right back up" and no more shortness of breath when walking.     Pertinent History  Pt reports that he had 2 hospital visits for endocarditis starting in December 2017 and then January 2018. 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 He started having dizziness at that time. Symptoms have persisted since that time without improvement or worsening. He has a prolonged history of bilateral LE neuropathy over the course of multiple years. 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 He has a-fib, CAD, hyperlipidemia, and HTN. History of alcoholism in the past. 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 He reports recent hypotension and his BP medications have been modified. When asked about his symptoms pt reports  "It feels like I lose feeling in my head" Describes feeling "woozy." Denies vertigo. Reports feeling lightheaded like he might pass out. He had a syncopal episode 1.5 months ago. He has a visit with his cardiologist next week.  Limitations  Walking    Diagnostic tests  MRI brain 27/27/18: acute CVA superior L parietal and right external capsule as well as chronic ischemia. CTA head/neck: multiple areas of severe stenosis    Patient Stated Goals  Decrease symptoms and return to leisure activities    Currently in Pain?  No/denies       Spent time discussing patient's recent hospitalization due to anemia. Patient received transfusion during hospitalization. Patient reports that he feels much better now and that his energy level is back up and that he is steadier on his feet.  Therapeutic Exercise: Performed [employer]  tower cable system machine walking forward with #15 pounds 10 reps and sidestepping to the right with 12/5# 10 reps with CGA. Attempted to perform left sidestepping but patient reports that his right hip is sore. Patient's oxygen saturation level was 98% and HR was 91 bpm after 10 reps of forward walking.  Patient reports that he can feel fatigue in his hip muscles and states he started to feel some discomfort in right hip during last few reps.  Patient did not have any noticeable shortness of breath with activity this date and required no sitting rest breaks. Patient took one standing rest break between forward and sidestepping sets.   Patient had increased difficulty with backwards walking and required cuing to slow movements down and for maintaining balance.  Patient performed using SPC.  Leg Press: Patient performed leg press machine #150 pounds 1 set of 10 reps and 1 set of 15 reps with verbal cue for technique. Patient's oxygen saturation level was 100% and HR was 86-87 bpm after first 5 reps.  Patient able to perform demonstrating good technique.    PT Education - 11/10/17 1229    Education provided  Yes    Education Details  discussed recent hospitalization for blood transfusion and how patient is feeling currently, discussed plan of care; discussed will repeat functional outcome tests next session and talked about plans upon discharge    Person(s) Educated  Patient    Methods  Explanation    Comprehension  Verbalized understanding       PT Short Term Goals - 10/05/17 1044      PT SHORT TERM GOAL #1   Title  Pt will be independent with HEP in order to improve strength and balance in order to decrease fall risk and improve function at home and with leisure activities    Time  6    Period  Weeks    Status  Achieved        PT Long Term Goals - 10/05/17 1044      PT LONG TERM GOAL #1   Title  Pt will improve DGI by at least 3 points in order to demonstrate clinically  significant improvement in balance and decreased risk for falls     Baseline  07/09/17: 10/24; 08/10/2017 scored 17/24; scored 19/24 on 10/05/18    Time  6    Period  Weeks    Status  Achieved      PT LONG TERM GOAL #2   Title  Pt will improve ABC by at least 13% in order to demonstrate clinically significant improvement in balance confidence.     Baseline  07/09/17: 40.9%; on 08/10/17 scored 48.8%; scored 56.8% on 10/05/17    Time  6    Period  Weeks    Status  Achieved      PT LONG TERM GOAL #3   Title  Pt  will decrease DHI score by at least 18 points in order to demonstrate clinically significant reduction in disability     Baseline  07/09/17: 60/100; on 08/10/17 scored 64/100; scored 56/100 on 10/05/17    Time  6    Period  Weeks    Status  Partially Met      PT LONG TERM GOAL #4   Title  Pt will improve Berg Balance score by at least 10 points to demonstrate decreased risk of falling.      Baseline  07/13/17: 31/56; on 08/10/17 scored 39/56; scored 47/56 on 10/05/17    Time  4    Period  Weeks    Status  Achieved      PT LONG TERM GOAL #5   Title  Patient will demonstrate reduced falls risk as evidenced by Dynamic Gait Index (DGI) >19/24.    Baseline  scored 17/24 on 08/11/2017; scored 19/24 on 10/05/17    Time  8    Period  Weeks    Status  Partially Met      PT LONG TERM GOAL #6   Title  Patient will reduce falls risk as indicated by Activities Specific Balance Confidence Scale (ABC) >67%.    Baseline  scored 48% on 10/24 and scored 56.8% on 10/05/17    Time  8    Period  Weeks    Status  Partially Met            Plan - 11/10/17 1230    Clinical Impression Statement  Patient had visit iwth Dr. Doy Hutching as recommended and patient was found to have low hemoglobin levels requiring hospitalization for anemia last week. Patient reports he recieved several units of blood in the hospital and that he feels much better. Patient reports that he is no longer getting shortness  of breath when walking into clinic and during session. Patient's vital signs montiored during session and patients oxygen saturation level was 98-100% and HR was 86-91 bpm which is much improved. Patient was better able to particpate in therapy and worked on lower body strengthening activties. Plan on retesting functional outcome measures next session and working towards graduation from PT services with goal of transitioning to a community based exercise program.     Rehab Potential  Fair    PT Frequency  1x / week    PT Duration  6 weeks    PT Treatment/Interventions  ADLs/Self Care Home Management;Aquatic Therapy;Canalith Repostioning;Moist Heat;Gait training;Stair training;Functional mobility training;Therapeutic activities;Therapeutic exercise;DME Instruction;Balance training;Neuromuscular re-education;Patient/family education;Manual techniques;Energy conservation;Vestibular;Visual/perceptual remediation/compensation;Cryotherapy    PT Next Visit Plan  repeat functional outcome testing     PT Home Exercise Plan  VOR x 1 horizontal in sitting with back support 1 min x 3, 4x/day; Sit<>stand with use of BUE, semitandem balance with horizontal head turns, sitting ball pass with head/eye follow with conflicting background    Consulted and Agree with Plan of Care  Patient       Patient will benefit from skilled therapeutic intervention in order to improve the following deficits and impairments:  Abnormal gait, Decreased balance, Decreased safety awareness, Decreased strength, Dizziness  Visit Diagnosis: Dizziness and giddiness  Unsteadiness on feet  Repeated falls     Problem List Patient Active Problem List   Diagnosis Date Noted  . Anemia 11/02/2017  . TIA (transient ischemic attack) 12/15/2016  . CVA (cerebral vascular accident) ([place]) 12/15/2016  . Septic shock ([place]) 11/19/2016  . Pressure injury of skin 10/03/2016  . Sepsis ([place]) 10/02/2016  Lady Deutscher PT,  DPT [phone] Lady Deutscher 11/10/2017, 12:43 PM  [place] MAIN [facility] SERVICES [address] [place], [place], [address] Phone: [phone]   Fax:  [phone]  Name: SHAARAV RIPPLE MRN: [id number] Date of Birth: 11-19-1945

## 2017-11-10 DIAGNOSIS — D509 Iron deficiency anemia, unspecified: Secondary | ICD-10-CM | POA: Diagnosis not present

## 2017-11-18 DIAGNOSIS — Z5181 Encounter for therapeutic drug level monitoring: Secondary | ICD-10-CM | POA: Diagnosis not present

## 2017-11-18 DIAGNOSIS — Z7901 Long term (current) use of anticoagulants: Secondary | ICD-10-CM | POA: Diagnosis not present

## 2017-11-19 ENCOUNTER — Ambulatory Visit: Payer: PPO | Attending: Neurology | Admitting: Physical Therapy

## 2017-11-19 ENCOUNTER — Encounter: Payer: Self-pay | Admitting: Physical Therapy

## 2017-11-19 VITALS — BP 138/61 | HR 63

## 2017-11-19 DIAGNOSIS — R296 Repeated falls: Secondary | ICD-10-CM | POA: Insufficient documentation

## 2017-11-19 DIAGNOSIS — R2681 Unsteadiness on feet: Secondary | ICD-10-CM | POA: Diagnosis not present

## 2017-11-19 DIAGNOSIS — R42 Dizziness and giddiness: Secondary | ICD-10-CM

## 2017-11-19 NOTE — Therapy (Signed)
[place] MAIN [facility] SERVICES [address] [place], [place], [address] Phone: [phone]   Fax:  [phone]  Physical Therapy Treatment/ Discharge Summary  Patient Details  Name: [name] MRN: [id number] Date of Birth: 04-02-1946 Referring Provider: Dr. Manuella Ghazi   Encounter Date: 11/19/2017  PT End of Session - 11/19/17 0900    Visit Number  15    Number of Visits  17    Date for PT Re-Evaluation  11/16/17    PT Start Time  0848    PT Stop Time  0940    PT Time Calculation (min)  52 min    Equipment Utilized During Treatment  Gait belt    Activity Tolerance  Patient tolerated treatment well    Behavior During Therapy  [facility] for tasks assessed/performed       Past Medical History:  Diagnosis Date  . Chronic pain   . Coronary artery disease   . Diabetes mellitus without complication ([place])   . DJD (degenerative joint disease)   . GERD (gastroesophageal reflux disease)   . Hyperlipemia   . Hypertension   . Insomnia   . Neuropathy     Past Surgical History:  Procedure Laterality Date  . BACK SURGERY    . CARDIAC SURGERY    . CARDIOVERSION N/A 05/04/2017   Procedure: CARDIOVERSION;  Surgeon: Corey Skains, MD;  Location: [facility] ORS;  Service: Cardiovascular;  Laterality: N/A;  . JOINT REPLACEMENT    . TEE WITHOUT CARDIOVERSION N/A 10/08/2016   Procedure: TRANSESOPHAGEAL ECHOCARDIOGRAM (TEE);  Surgeon: Teodoro Spray, MD;  Location: [facility] ORS;  Service: Cardiovascular;  Laterality: N/A;  . TEE WITHOUT CARDIOVERSION N/A 11/23/2016   Procedure: TRANSESOPHAGEAL ECHOCARDIOGRAM (TEE);  Surgeon: Isaias Cowman, MD;  Location: [facility] ORS;  Service: Cardiovascular;  Laterality: N/A;    Vitals:   11/19/17 0858  BP: 138/61  Pulse: 63  SpO2: 98%    Subjective Assessment - 11/19/17 0853    Subjective  Noted patient has mulptile abrasions right knee and lower anterior shin and lower left anterior shin. Patient states he was walking  outside and his foot slipped on leaves and his legs scraped against some tree branches. Patient states he did not feel it despite his legs bleeding and his wife told him that he was bleeding.  Patient states that he feels really good today. Patient states that he has been feeling much better since getting the blood transfusion and states that he thinks the iron pills are helping too. Patient states he has not been getting short of breath with activities and has improved energy levels.     Pertinent History  Pt reports that he had 2 hospital visits for endocarditis starting in December 2017 and then January 2018. 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 He started having dizziness at that time. Symptoms have persisted since that time without improvement or worsening. He has a prolonged history of bilateral LE neuropathy over the course of multiple years. 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 He has a-fib, CAD, hyperlipidemia, and HTN. History of alcoholism in the past. 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 He reports recent hypotension and his BP medications have been modified. When  asked about his symptoms pt reports  "It feels like I lose feeling in my head" Describes feeling "woozy." Denies vertigo. Reports feeling lightheaded like he might pass out. He had a syncopal episode 1.5 months ago. He has a visit with his cardiologist next week.    Limitations  Walking    Diagnostic tests  MRI brain 27/27/18: acute CVA superior L parietal and right external capsule as well as chronic ischemia. CTA head/neck: multiple areas of severe stenosis    Patient Stated Goals  Decrease symptoms and return to leisure activities    Currently in Pain?  No/denies          [facility] PT Assessment - 11/19/17 0001      Berg Balance Test   Sit to Stand  Able to stand  independently using hands    Standing Unsupported  Able to stand safely 2 minutes    Sitting with Back Unsupported but Feet Supported on Floor or Stool  Able to sit safely and securely 2 minutes    Stand to Sit  Sits safely with minimal use of hands    Transfers  Able to transfer safely, definite need of hands    Standing Unsupported with Eyes Closed  Able to stand 10 seconds with supervision    Standing Ubsupported with Feet Together  Able to place feet together independently and stand for 1 minute with supervision    From Standing, Reach Forward with Outstretched Arm  Can reach confidently >25 cm (10")    From Standing Position, Pick up Object from Floor  Able to pick up shoe, needs supervision    From Standing Position, Turn to Look Behind Over each Shoulder  Looks behind from both sides and weight shifts well    Turn 360 Degrees  Able to turn 360 degrees safely in 4 seconds or less    Standing Unsupported, Alternately Place Feet on Step/Stool  Able to stand independently and safely and complete 8 steps in 20 seconds    Standing Unsupported, One Foot in Front  Able to plae foot ahead of the other independently and hold 30 seconds    Standing on One Leg  Able to lift leg independently and hold equal to or more than 3 seconds    Total Score  48      Dynamic Gait Index   Level Surface  Mild Impairment    Change in Gait Speed  Mild Impairment    Gait with Horizontal Head Turns  Mild Impairment    Gait with Vertical Head Turns  Mild Impairment    Gait and Pivot Turn  Normal    Step Over Obstacle  Normal    Step Around Obstacles  Normal    Steps  Moderate Impairment    Total Score  18      Physical Performance:  FUNCTIONAL OUTCOME MEASURES:  Results Comments  DHI 42/100 Moderate perception of handicap; in need of intervention  ABC Scale 75% Low falls risk; in need of intervention  DGI  18/24 Falls risk; in need of intervention  BERG  48/56 Mild falls risk; in need of intervention   Reviewed functional outcome testing results and discussed progress towards goals. Discussed discharge plans. Patient states he starts on Monday. Monitored patient's vital signs during last treatment session with strengthening and balance activities and patient tolerated that session well.  Patient states he signed up for the [facility] and the [employer] program.  Patient reports that he has been to the [facility]  in the past and that he is familiar with the machines. Discussed safety with gym activities.  Discussed using walking track, using cane and gradually increasing walking distances.  Recommended that patient continue to use [facility] for mobility and standing activities and patient is in agreement. Discussed continued monitoring of his lower extremity abrasions and to contact his physician if his wounds display signs of infection.    PT Education - 11/19/17 0900    Education provided  Yes    Education Details  discussed functional outcome measure testing results and progress towards goals; discussed discharge plans    Person(s) Educated  Patient    Methods  Explanation    Comprehension  Verbalized understanding       PT Short Term Goals - 11/19/17 0901      PT SHORT TERM GOAL #1   Title  Pt will be independent with HEP in order to improve strength and balance in order to decrease fall risk and improve function at home and with leisure activities    Time  6    Period  Weeks    Status  Achieved        PT Long Term Goals - 11/19/17 0900      PT LONG TERM GOAL #1   Title  Pt will improve DGI by at least 3 points in order to demonstrate clinically significant improvement in balance and decreased risk for falls     Baseline  07/09/17: 10/24; 08/10/2017 scored 17/24; scored 19/24 on 10/05/18    Time  6    Period  Weeks    Status  Achieved      PT LONG TERM GOAL #2   Title  Pt will improve  ABC by at least 13% in order to demonstrate clinically significant improvement in balance confidence.     Baseline  07/09/17: 40.9%; on 08/10/17 scored 48.8%; scored 56.8% on 10/05/17    Time  6    Period  Weeks    Status  Achieved      PT LONG TERM GOAL #3   Title  Pt will decrease DHI score by at least 18 points in order to demonstrate clinically significant reduction in disability     Baseline  07/09/17: 60/100; on 08/10/17 scored 64/100; scored 56/100 on 10/05/17; scored 42/100 on 11/19/17    Time  6    Period  Weeks    Status  Achieved      PT LONG TERM GOAL #4   Title  Pt will improve Berg Balance score by at least 10 points to demonstrate decreased risk of falling.      Baseline  07/13/17: 31/56; on 08/10/17 scored 39/56; scored 47/56 on 10/05/17; scired 48.56 on 11/19/17    Time  4    Period  Weeks    Status  Achieved      PT LONG TERM GOAL #5   Title  Patient will demonstrate reduced falls risk as evidenced by Dynamic Gait Index (DGI) >19/24.    Baseline  scored 17/24 on 08/11/2017; scored 19/24 on 10/05/17; scored 18/24 on 11/19/17    Time  8    Period  Weeks    Status  Partially Met      PT LONG TERM GOAL #6   Title  Patient will reduce falls risk as indicated by Activities Specific Balance Confidence Scale (ABC) >67%.    Baseline  scored 48% on 10/24 and scored 56.8% on 10/05/17; scored 75% on 11/19/17    Time  8    Period  Weeks    Status  Achieved            Plan - 11/19/17 0901    Clinical Impression Statement  Retested functional outcome measures this date and patient did well meeting 1/1 short term goals and 5/6 long term goals with remaining 1 long term goal partially met. Patient improved from 40% to 75% on the ABC scale and from 10/24 to 18/24 on the DGI. Patient decreased from 60/100 to 42/100 on the [facility]. Patient improved from 31/56 to 48/56 on the Berg balance scale. Patient has demonstrated improvements in his balance, strength and endurance with activities.  Patient's vital signs were much improved last visit and this visit. Patient appears ready to transition to a community based exercise program. Patient states that he is signed up at the [facility] and for [employer]. Recommended to patient that he continue to use [facility] for all mobility and standing activities as he has significantly decreased sensation in lower extremities. Patient in agreement with continued use of SPC and with discharge from PT services at this time. Patient planning on attending community gym to help maintain gains achieved in therapy. Will plan on discharging patient from PT services at this time.      Rehab Potential  Fair    PT Frequency  1x / week    PT Duration  6 weeks    PT Treatment/Interventions  ADLs/Self Care Home Management;Aquatic Therapy;Canalith Repostioning;Moist Heat;Gait training;Stair training;Functional mobility training;Therapeutic activities;Therapeutic exercise;DME Instruction;Balance training;Neuromuscular re-education;Patient/family education;Manual techniques;Energy conservation;Vestibular;Visual/perceptual remediation/compensation;Cryotherapy    PT Next Visit Plan  --    PT Home Exercise Plan  VOR x 1 horizontal in sitting with back support 1 min x 3, 4x/day; Sit<>stand with use of BUE, semitandem balance with horizontal head turns, sitting ball pass with head/eye follow with conflicting background    Consulted and Agree with Plan of Care  Patient       Patient will benefit from skilled therapeutic intervention in order to improve the following deficits and impairments:  Abnormal gait, Decreased balance, Decreased safety awareness, Decreased strength, Dizziness  Visit Diagnosis: Dizziness and giddiness  Unsteadiness on feet  Repeated falls     Problem List Patient Active Problem List   Diagnosis Date Noted  . Anemia 11/02/2017  . TIA (transient ischemic attack) 12/15/2016  . CVA (cerebral vascular accident) ([place]) 12/15/2016  . Septic shock  ([place]) 11/19/2016  . Pressure injury of skin 10/03/2016  . Sepsis ([place]) 10/02/2016   Lady Deutscher PT, DPT [phone] Lady Deutscher 11/19/2017, 10:35 AM  [place] MAIN [facility] SERVICES [address] [place], [place], [address] Phone: [phone]   Fax:  [phone]  Name: YUVRAJ PFEIFER MRN: [id number] Date of Birth: 28-Mar-1946

## 2017-11-22 DIAGNOSIS — R42 Dizziness and giddiness: Secondary | ICD-10-CM | POA: Diagnosis not present

## 2017-11-22 DIAGNOSIS — I700 Atherosclerosis of aorta: Secondary | ICD-10-CM | POA: Diagnosis not present

## 2017-11-22 DIAGNOSIS — D509 Iron deficiency anemia, unspecified: Secondary | ICD-10-CM | POA: Diagnosis not present

## 2017-11-22 DIAGNOSIS — I350 Nonrheumatic aortic (valve) stenosis: Secondary | ICD-10-CM | POA: Diagnosis not present

## 2017-11-23 ENCOUNTER — Encounter: Payer: PPO | Admitting: Physical Therapy

## 2017-11-23 DIAGNOSIS — L97521 Non-pressure chronic ulcer of other part of left foot limited to breakdown of skin: Secondary | ICD-10-CM | POA: Diagnosis not present

## 2017-12-02 DIAGNOSIS — R791 Abnormal coagulation profile: Secondary | ICD-10-CM | POA: Diagnosis not present

## 2017-12-02 IMAGING — CT CT ANGIO NECK
1 of 8 series · 6 of 33 positions shown · IV contrast (APPLIED)
Comparison: Head CT without contrast 0710 hours today. Brain MRI
with intracranial MRA 03/07/2008

ADDENDUM:
Study discussed by telephone with Dr. SANDRA JIMENA EJURO on 12/15/2016 at
8493 hours.
CLINICAL DATA: 70-year-old male with left side weakness since 1111
hours today. Initial encounter.

EXAM:
CT ANGIOGRAPHY HEAD AND NECK
TECHNIQUE: Multidetector CT imaging of the head and neck was performed using
the standard protocol during bolus administration of intravenous
contrast. Multiplanar CT image reconstructions and MIPs were
obtained to evaluate the vascular anatomy. Carotid stenosis
measurements (when applicable) are obtained utilizing NASCET
criteria, using the distal internal carotid diameter as the
denominator.
CONTRAST:  75 mL Isovue 370

[Series 6: ax thin · axial · 0.39mm/px · z∈[-334,-65]mm · 6 of 377 slices shown]
[im 54/377  soft-tissue]
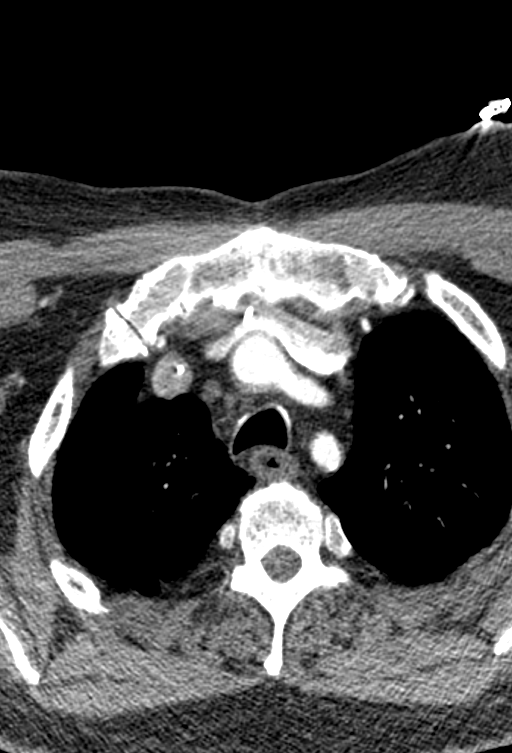
[im 108/377  bone]
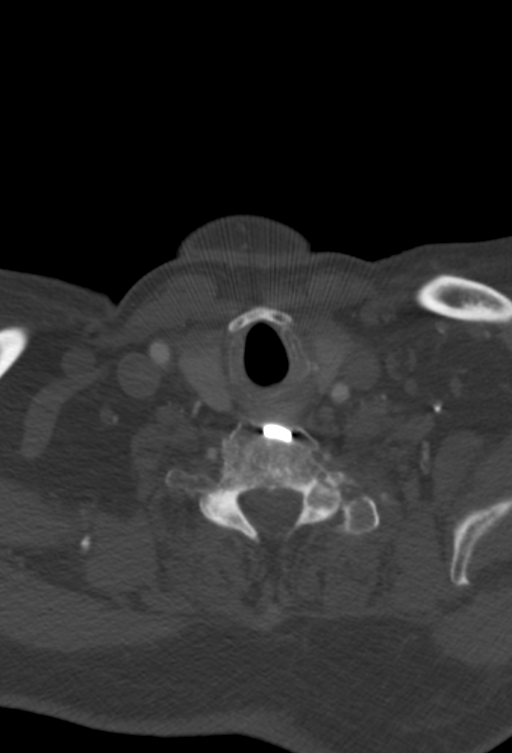
[im 162/377  soft-tissue]
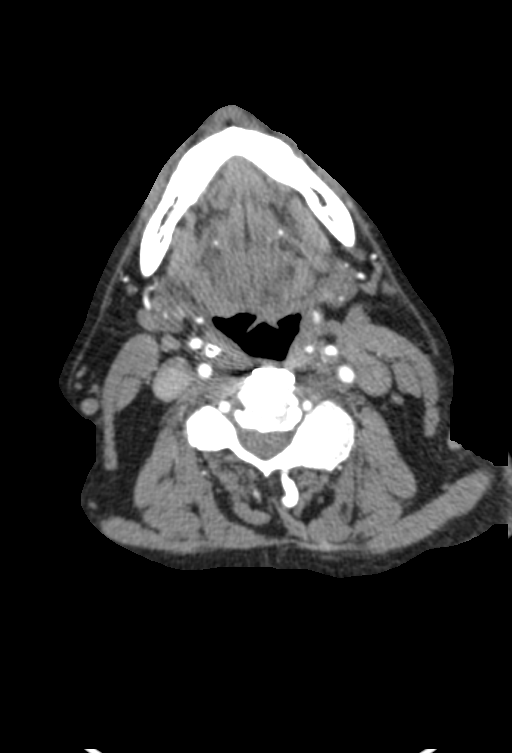
[im 215/377  bone]
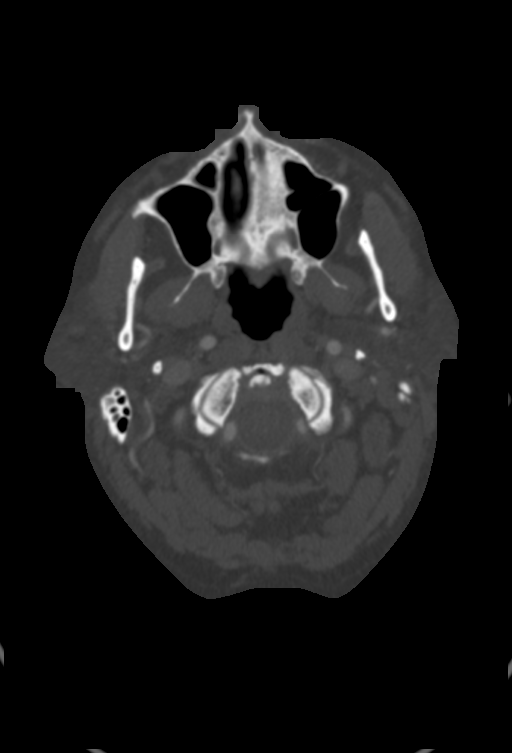
[im 269/377  soft-tissue]
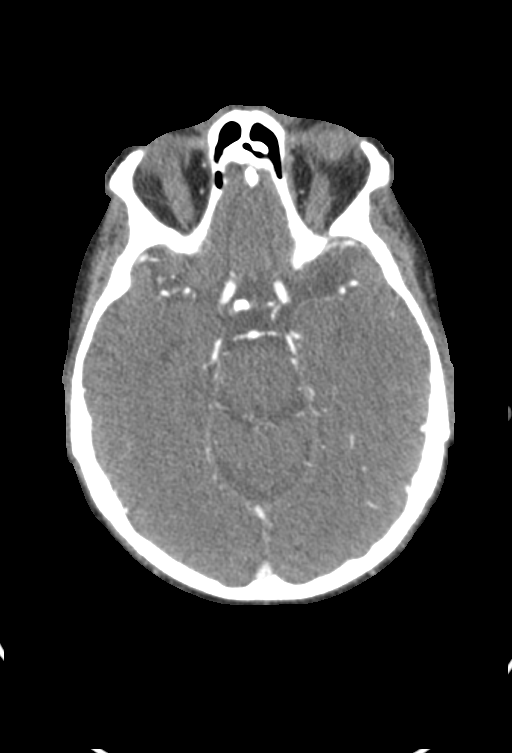
[im 323/377  bone]
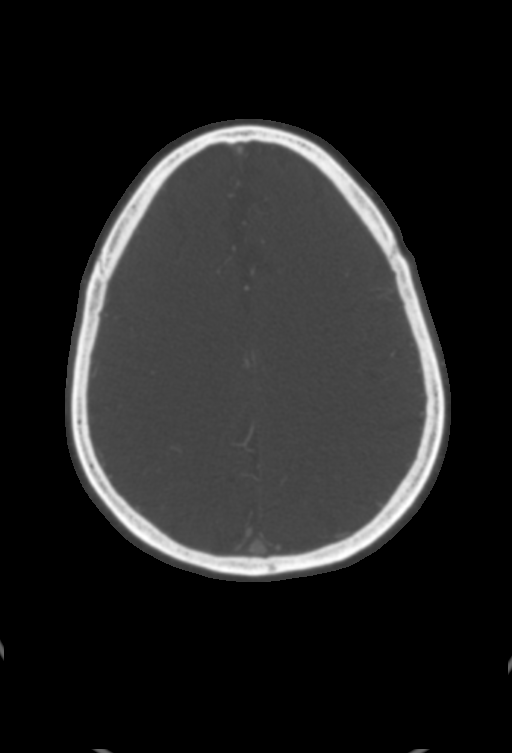

[6 of 33 positions shown; findings below may reference images not displayed]

FINDINGS: CTA NECK

Skeleton: Extensive previous cervical spine ACDF. Previous median
sternotomy. No acute osseous abnormality identified. Visualized
paranasal sinuses and mastoids are stable and well pneumatized.

Upper chest: Incidental azygos fissure. Mild mosaic attenuation in
the visualized bilateral lung parenchyma. No superior mediastinal
lymphadenopathy.

Other neck: Thyroid, larynx, pharynx, parapharyngeal spaces,
retropharyngeal space, sublingual space, submandibular glands and
parotid glands are within normal limits. No cervical
lymphadenopathy.

Aortic arch: Bovine type arch configuration. Mild to moderate aortic
arch calcified atherosclerosis.

Right carotid system: No brachiocephalic or right CCA origin
stenosis despite soft and calcified plaque. Mildly tortuous proximal
right ICA. Widely patent right carotid bifurcation with minimal soft
plaque. Mildly tortuous cervical right ICA.

Left carotid system: Bovine type left CCA origin with no origin
stenosis. Mild circumferential soft plaque in the left CCA. Widely
patent left carotid bifurcation. Mild left ICA bulb soft and
calcified plaque without stenosis. Mildly tortuous cervical left
ICA.

Vertebral arteries:Mild tortuosity of the proximal right subclavian
artery with soft and calcified plaque but no stenosis. Calcified
plaque at the right vertebral artery origin with mild to moderate
origin stenosis (Series 7, image 167). Otherwise negative right
vertebral artery to the skullbase.

No significant proximal left subclavian artery stenosis despite soft
and calcified plaque. Calcified plaque at the left vertebral artery
origin with severe origin stenosis (series 8, image 137). The left
vertebral artery is mildly non dominant and otherwise negative to
the skullbase.

CTA HEAD

Posterior circulation: Dominant distal right vertebral artery with
no stenosis to the vertebrobasilar junction. Normal right PICA
origin. Calcified plaque in the non dominant distal left V4 segment
distal to the low PICA origin which remains normal. Subsequent
moderate distal left vertebral stenosis, but the vessel remains
patent to the vertebrobasilar junction. No basilar stenosis. Patent
SCA and PCA origins. Both posterior communicating arteries are
present but small. Right PCA branches are normal. There is mild
irregularity without significant stenosis in the left P1 segment.
Left PCA branches are within normal limits.

Anterior circulation: Both ICA siphons are patent. Minimal siphon
calcified plaque without stenosis. Normal bilateral ophthalmic and
posterior communicating artery origins. Patent carotid termini.
Normal MCA and ACA origins. Anterior communicating artery and
bilateral ACA branches are normal with a prominent median artery of
the corpus callosum (normal variant). Left MCA M1 segment,
trifurcation, and left MCA branches are normal. Right MCA M1
segment, trifurcation, and proximal M2 branches are normal. There is
a severe stenosis at the bifurcation of a posterior right MCA M2
branch best seen on series 8, image 61 (also series 11, image 14 and
series 6, image 100), but the downstream branches remain patent.

Venous sinuses: Patent.

Anatomic variants: Dominant right vertebral artery. Median artery of
the corpus callosum. Bovine type aortic arch configuration.

Delayed phase: Small age indeterminate cortically based infarct at
the right sensory strip (series 12, image 24), might have been
present in September 2016. Otherwise no cortically based infarct is
evident in the right MCA territory. No acute intracranial hemorrhage
identified. No intracranial mass effect. No abnormal enhancement
identified.

Review of the MIP images confirms the above findings
IMPRESSION: 1. Negative for emergent large vessel occlusion but positive for
filling defect at the bifurcation of a right MCA middle M2 branch.
Despite this severe stenosis the downstream M3 branches remain
patent.
2. Otherwise mild for age intra- and extracranial carotid and
anterior circulation atherosclerosis.
3. Dominant right vertebral artery with up to moderate stenosis at
its origin. Severe stenosis of the non dominant left vertebral
artery at both its origin and distal V4 segment. The left PICA
remains patent.
4. Mild atherosclerosis without stenosis of the left PCA P1 segment.
5. Small age indeterminate cortical infarct in the posterior right
MCA territory sensory strip. No other right MCA territory ischemic
changes by CT.
6. Mild mosaic attenuation in the upper lungs may indicate gas
trapping.
7.  Calcified aortic atherosclerosis.

## 2017-12-10 DIAGNOSIS — M5137 Other intervertebral disc degeneration, lumbosacral region: Secondary | ICD-10-CM | POA: Diagnosis not present

## 2017-12-10 DIAGNOSIS — M545 Low back pain: Secondary | ICD-10-CM | POA: Diagnosis not present

## 2017-12-10 DIAGNOSIS — G894 Chronic pain syndrome: Secondary | ICD-10-CM | POA: Diagnosis not present

## 2017-12-16 DIAGNOSIS — R791 Abnormal coagulation profile: Secondary | ICD-10-CM | POA: Diagnosis not present

## 2017-12-21 DIAGNOSIS — Z79899 Other long term (current) drug therapy: Secondary | ICD-10-CM | POA: Diagnosis not present

## 2017-12-21 DIAGNOSIS — L97421 Non-pressure chronic ulcer of left heel and midfoot limited to breakdown of skin: Secondary | ICD-10-CM | POA: Diagnosis not present

## 2017-12-21 DIAGNOSIS — I10 Essential (primary) hypertension: Secondary | ICD-10-CM | POA: Diagnosis not present

## 2017-12-21 DIAGNOSIS — I480 Paroxysmal atrial fibrillation: Secondary | ICD-10-CM | POA: Diagnosis not present

## 2017-12-21 DIAGNOSIS — I2510 Atherosclerotic heart disease of native coronary artery without angina pectoris: Secondary | ICD-10-CM | POA: Diagnosis not present

## 2017-12-21 DIAGNOSIS — D509 Iron deficiency anemia, unspecified: Secondary | ICD-10-CM | POA: Diagnosis not present

## 2017-12-21 DIAGNOSIS — Z0000 Encounter for general adult medical examination without abnormal findings: Secondary | ICD-10-CM | POA: Diagnosis not present

## 2017-12-21 DIAGNOSIS — E1140 Type 2 diabetes mellitus with diabetic neuropathy, unspecified: Secondary | ICD-10-CM | POA: Diagnosis not present

## 2017-12-21 DIAGNOSIS — E782 Mixed hyperlipidemia: Secondary | ICD-10-CM | POA: Diagnosis not present

## 2017-12-30 DIAGNOSIS — R791 Abnormal coagulation profile: Secondary | ICD-10-CM | POA: Diagnosis not present

## 2018-01-18 DIAGNOSIS — E1140 Type 2 diabetes mellitus with diabetic neuropathy, unspecified: Secondary | ICD-10-CM | POA: Diagnosis not present

## 2018-01-18 DIAGNOSIS — Z8719 Personal history of other diseases of the digestive system: Secondary | ICD-10-CM | POA: Diagnosis not present

## 2018-01-18 DIAGNOSIS — D369 Benign neoplasm, unspecified site: Secondary | ICD-10-CM | POA: Diagnosis not present

## 2018-01-18 DIAGNOSIS — K219 Gastro-esophageal reflux disease without esophagitis: Secondary | ICD-10-CM | POA: Diagnosis not present

## 2018-01-18 DIAGNOSIS — D509 Iron deficiency anemia, unspecified: Secondary | ICD-10-CM | POA: Diagnosis not present

## 2018-01-18 DIAGNOSIS — L97421 Non-pressure chronic ulcer of left heel and midfoot limited to breakdown of skin: Secondary | ICD-10-CM | POA: Diagnosis not present

## 2018-01-21 DIAGNOSIS — M5136 Other intervertebral disc degeneration, lumbar region: Secondary | ICD-10-CM | POA: Diagnosis not present

## 2018-01-21 DIAGNOSIS — Z79899 Other long term (current) drug therapy: Secondary | ICD-10-CM | POA: Diagnosis not present

## 2018-01-21 DIAGNOSIS — G894 Chronic pain syndrome: Secondary | ICD-10-CM | POA: Diagnosis not present

## 2018-01-21 DIAGNOSIS — M545 Low back pain: Secondary | ICD-10-CM | POA: Diagnosis not present

## 2018-01-25 DIAGNOSIS — L97421 Non-pressure chronic ulcer of left heel and midfoot limited to breakdown of skin: Secondary | ICD-10-CM | POA: Diagnosis not present

## 2018-01-25 DIAGNOSIS — E1140 Type 2 diabetes mellitus with diabetic neuropathy, unspecified: Secondary | ICD-10-CM | POA: Diagnosis not present

## 2018-01-27 DIAGNOSIS — I350 Nonrheumatic aortic (valve) stenosis: Secondary | ICD-10-CM | POA: Diagnosis not present

## 2018-02-01 DIAGNOSIS — E1140 Type 2 diabetes mellitus with diabetic neuropathy, unspecified: Secondary | ICD-10-CM | POA: Diagnosis not present

## 2018-02-01 DIAGNOSIS — L97421 Non-pressure chronic ulcer of left heel and midfoot limited to breakdown of skin: Secondary | ICD-10-CM | POA: Diagnosis not present

## 2018-02-08 DIAGNOSIS — I350 Nonrheumatic aortic (valve) stenosis: Secondary | ICD-10-CM | POA: Diagnosis not present

## 2018-02-08 DIAGNOSIS — L97421 Non-pressure chronic ulcer of left heel and midfoot limited to breakdown of skin: Secondary | ICD-10-CM | POA: Diagnosis not present

## 2018-02-09 DIAGNOSIS — I350 Nonrheumatic aortic (valve) stenosis: Secondary | ICD-10-CM | POA: Diagnosis not present

## 2018-02-09 DIAGNOSIS — I10 Essential (primary) hypertension: Secondary | ICD-10-CM | POA: Diagnosis not present

## 2018-02-09 DIAGNOSIS — I480 Paroxysmal atrial fibrillation: Secondary | ICD-10-CM | POA: Diagnosis not present

## 2018-02-09 DIAGNOSIS — R600 Localized edema: Secondary | ICD-10-CM | POA: Diagnosis not present

## 2018-02-15 DIAGNOSIS — R791 Abnormal coagulation profile: Secondary | ICD-10-CM | POA: Diagnosis not present

## 2018-02-15 DIAGNOSIS — R600 Localized edema: Secondary | ICD-10-CM | POA: Diagnosis not present

## 2018-02-16 DIAGNOSIS — D509 Iron deficiency anemia, unspecified: Secondary | ICD-10-CM | POA: Diagnosis not present

## 2018-02-22 DIAGNOSIS — L97421 Non-pressure chronic ulcer of left heel and midfoot limited to breakdown of skin: Secondary | ICD-10-CM | POA: Diagnosis not present

## 2018-02-22 DIAGNOSIS — L97521 Non-pressure chronic ulcer of other part of left foot limited to breakdown of skin: Secondary | ICD-10-CM | POA: Diagnosis not present

## 2018-02-22 DIAGNOSIS — E1140 Type 2 diabetes mellitus with diabetic neuropathy, unspecified: Secondary | ICD-10-CM | POA: Diagnosis not present

## 2018-03-01 DIAGNOSIS — R600 Localized edema: Secondary | ICD-10-CM | POA: Diagnosis not present

## 2018-03-01 DIAGNOSIS — E782 Mixed hyperlipidemia: Secondary | ICD-10-CM | POA: Diagnosis not present

## 2018-03-01 DIAGNOSIS — G894 Chronic pain syndrome: Secondary | ICD-10-CM | POA: Diagnosis not present

## 2018-03-01 DIAGNOSIS — I10 Essential (primary) hypertension: Secondary | ICD-10-CM | POA: Diagnosis not present

## 2018-03-01 DIAGNOSIS — M79604 Pain in right leg: Secondary | ICD-10-CM | POA: Diagnosis not present

## 2018-03-01 DIAGNOSIS — Z79899 Other long term (current) drug therapy: Secondary | ICD-10-CM | POA: Diagnosis not present

## 2018-03-01 DIAGNOSIS — M79605 Pain in left leg: Secondary | ICD-10-CM | POA: Diagnosis not present

## 2018-03-01 DIAGNOSIS — R791 Abnormal coagulation profile: Secondary | ICD-10-CM | POA: Diagnosis not present

## 2018-03-01 DIAGNOSIS — E1140 Type 2 diabetes mellitus with diabetic neuropathy, unspecified: Secondary | ICD-10-CM | POA: Diagnosis not present

## 2018-03-15 DIAGNOSIS — E1151 Type 2 diabetes mellitus with diabetic peripheral angiopathy without gangrene: Secondary | ICD-10-CM | POA: Diagnosis not present

## 2018-03-15 DIAGNOSIS — B351 Tinea unguium: Secondary | ICD-10-CM | POA: Diagnosis not present

## 2018-03-15 DIAGNOSIS — L97421 Non-pressure chronic ulcer of left heel and midfoot limited to breakdown of skin: Secondary | ICD-10-CM | POA: Diagnosis not present

## 2018-03-15 DIAGNOSIS — E1140 Type 2 diabetes mellitus with diabetic neuropathy, unspecified: Secondary | ICD-10-CM | POA: Diagnosis not present

## 2018-03-16 DIAGNOSIS — I2510 Atherosclerotic heart disease of native coronary artery without angina pectoris: Secondary | ICD-10-CM | POA: Diagnosis not present

## 2018-03-16 DIAGNOSIS — I10 Essential (primary) hypertension: Secondary | ICD-10-CM | POA: Diagnosis not present

## 2018-03-16 DIAGNOSIS — R600 Localized edema: Secondary | ICD-10-CM | POA: Diagnosis not present

## 2018-03-16 DIAGNOSIS — I700 Atherosclerosis of aorta: Secondary | ICD-10-CM | POA: Diagnosis not present

## 2018-03-16 DIAGNOSIS — G63 Polyneuropathy in diseases classified elsewhere: Secondary | ICD-10-CM | POA: Diagnosis not present

## 2018-03-16 DIAGNOSIS — Z952 Presence of prosthetic heart valve: Secondary | ICD-10-CM | POA: Diagnosis not present

## 2018-03-22 DIAGNOSIS — M79605 Pain in left leg: Secondary | ICD-10-CM | POA: Diagnosis not present

## 2018-03-22 DIAGNOSIS — M79604 Pain in right leg: Secondary | ICD-10-CM | POA: Diagnosis not present

## 2018-03-22 DIAGNOSIS — R600 Localized edema: Secondary | ICD-10-CM | POA: Diagnosis not present

## 2018-03-24 DIAGNOSIS — M545 Low back pain: Secondary | ICD-10-CM | POA: Diagnosis not present

## 2018-03-24 DIAGNOSIS — G894 Chronic pain syndrome: Secondary | ICD-10-CM | POA: Diagnosis not present

## 2018-04-20 DIAGNOSIS — H6123 Impacted cerumen, bilateral: Secondary | ICD-10-CM | POA: Diagnosis not present

## 2018-04-20 DIAGNOSIS — H903 Sensorineural hearing loss, bilateral: Secondary | ICD-10-CM | POA: Diagnosis not present

## 2018-04-25 DIAGNOSIS — I350 Nonrheumatic aortic (valve) stenosis: Secondary | ICD-10-CM | POA: Diagnosis not present

## 2018-04-25 DIAGNOSIS — G63 Polyneuropathy in diseases classified elsewhere: Secondary | ICD-10-CM | POA: Diagnosis not present

## 2018-04-25 DIAGNOSIS — Z8673 Personal history of transient ischemic attack (TIA), and cerebral infarction without residual deficits: Secondary | ICD-10-CM | POA: Diagnosis not present

## 2018-04-25 DIAGNOSIS — Z6839 Body mass index (BMI) 39.0-39.9, adult: Secondary | ICD-10-CM | POA: Diagnosis not present

## 2018-04-25 DIAGNOSIS — R42 Dizziness and giddiness: Secondary | ICD-10-CM | POA: Diagnosis not present

## 2018-04-25 DIAGNOSIS — I480 Paroxysmal atrial fibrillation: Secondary | ICD-10-CM | POA: Diagnosis not present

## 2018-04-26 DIAGNOSIS — E1151 Type 2 diabetes mellitus with diabetic peripheral angiopathy without gangrene: Secondary | ICD-10-CM | POA: Diagnosis not present

## 2018-04-26 DIAGNOSIS — I350 Nonrheumatic aortic (valve) stenosis: Secondary | ICD-10-CM | POA: Diagnosis not present

## 2018-04-26 DIAGNOSIS — Z79899 Other long term (current) drug therapy: Secondary | ICD-10-CM | POA: Diagnosis not present

## 2018-04-26 DIAGNOSIS — I10 Essential (primary) hypertension: Secondary | ICD-10-CM | POA: Diagnosis not present

## 2018-04-26 DIAGNOSIS — Z0000 Encounter for general adult medical examination without abnormal findings: Secondary | ICD-10-CM | POA: Diagnosis not present

## 2018-04-26 DIAGNOSIS — E782 Mixed hyperlipidemia: Secondary | ICD-10-CM | POA: Diagnosis not present

## 2018-04-26 DIAGNOSIS — Z125 Encounter for screening for malignant neoplasm of prostate: Secondary | ICD-10-CM | POA: Diagnosis not present

## 2018-04-26 DIAGNOSIS — I480 Paroxysmal atrial fibrillation: Secondary | ICD-10-CM | POA: Diagnosis not present

## 2018-05-05 DIAGNOSIS — G894 Chronic pain syndrome: Secondary | ICD-10-CM | POA: Diagnosis not present

## 2018-05-05 DIAGNOSIS — M545 Low back pain: Secondary | ICD-10-CM | POA: Diagnosis not present

## 2018-05-13 ENCOUNTER — Encounter: Payer: Self-pay | Admitting: *Deleted

## 2018-05-16 ENCOUNTER — Ambulatory Visit: Payer: PPO | Admitting: Anesthesiology

## 2018-05-16 ENCOUNTER — Encounter: Payer: Self-pay | Admitting: *Deleted

## 2018-05-16 ENCOUNTER — Encounter
Admission: RE | Disposition: A | Discharge status: Home / Self Care | Payer: Self-pay | Source: Ambulatory Visit | Attending: Unknown Physician Specialty

## 2018-05-16 ENCOUNTER — Ambulatory Visit
Admission: RE | Admit: 2018-05-16 | Discharge: 2018-05-16 | Disposition: A | Discharge status: Home / Self Care | Payer: PPO | Source: Ambulatory Visit | Attending: Unknown Physician Specialty | Admitting: Unknown Physician Specialty

## 2018-05-16 ENCOUNTER — Other Ambulatory Visit: Payer: Self-pay

## 2018-05-16 DIAGNOSIS — E785 Hyperlipidemia, unspecified: Secondary | ICD-10-CM | POA: Insufficient documentation

## 2018-05-16 DIAGNOSIS — K648 Other hemorrhoids: Secondary | ICD-10-CM | POA: Diagnosis not present

## 2018-05-16 DIAGNOSIS — Z7901 Long term (current) use of anticoagulants: Secondary | ICD-10-CM | POA: Diagnosis not present

## 2018-05-16 DIAGNOSIS — K635 Polyp of colon: Secondary | ICD-10-CM | POA: Diagnosis not present

## 2018-05-16 DIAGNOSIS — Z87891 Personal history of nicotine dependence: Secondary | ICD-10-CM | POA: Diagnosis not present

## 2018-05-16 DIAGNOSIS — K222 Esophageal obstruction: Secondary | ICD-10-CM | POA: Diagnosis not present

## 2018-05-16 DIAGNOSIS — Z7984 Long term (current) use of oral hypoglycemic drugs: Secondary | ICD-10-CM | POA: Diagnosis not present

## 2018-05-16 DIAGNOSIS — Z79899 Other long term (current) drug therapy: Secondary | ICD-10-CM | POA: Insufficient documentation

## 2018-05-16 DIAGNOSIS — I2510 Atherosclerotic heart disease of native coronary artery without angina pectoris: Secondary | ICD-10-CM | POA: Insufficient documentation

## 2018-05-16 DIAGNOSIS — E1151 Type 2 diabetes mellitus with diabetic peripheral angiopathy without gangrene: Secondary | ICD-10-CM | POA: Insufficient documentation

## 2018-05-16 DIAGNOSIS — I509 Heart failure, unspecified: Secondary | ICD-10-CM | POA: Diagnosis not present

## 2018-05-16 DIAGNOSIS — K2960 Other gastritis without bleeding: Secondary | ICD-10-CM | POA: Diagnosis not present

## 2018-05-16 DIAGNOSIS — K219 Gastro-esophageal reflux disease without esophagitis: Secondary | ICD-10-CM | POA: Diagnosis not present

## 2018-05-16 DIAGNOSIS — Z952 Presence of prosthetic heart valve: Secondary | ICD-10-CM | POA: Diagnosis not present

## 2018-05-16 DIAGNOSIS — D123 Benign neoplasm of transverse colon: Secondary | ICD-10-CM | POA: Diagnosis not present

## 2018-05-16 DIAGNOSIS — I110 Hypertensive heart disease with heart failure: Secondary | ICD-10-CM | POA: Insufficient documentation

## 2018-05-16 DIAGNOSIS — Z79891 Long term (current) use of opiate analgesic: Secondary | ICD-10-CM | POA: Insufficient documentation

## 2018-05-16 DIAGNOSIS — K319 Disease of stomach and duodenum, unspecified: Secondary | ICD-10-CM | POA: Diagnosis not present

## 2018-05-16 DIAGNOSIS — D509 Iron deficiency anemia, unspecified: Secondary | ICD-10-CM | POA: Insufficient documentation

## 2018-05-16 DIAGNOSIS — Z8673 Personal history of transient ischemic attack (TIA), and cerebral infarction without residual deficits: Secondary | ICD-10-CM | POA: Insufficient documentation

## 2018-05-16 DIAGNOSIS — K640 First degree hemorrhoids: Secondary | ICD-10-CM | POA: Insufficient documentation

## 2018-05-16 DIAGNOSIS — E1140 Type 2 diabetes mellitus with diabetic neuropathy, unspecified: Secondary | ICD-10-CM | POA: Diagnosis not present

## 2018-05-16 DIAGNOSIS — K3189 Other diseases of stomach and duodenum: Secondary | ICD-10-CM | POA: Diagnosis not present

## 2018-05-16 DIAGNOSIS — E1142 Type 2 diabetes mellitus with diabetic polyneuropathy: Secondary | ICD-10-CM | POA: Insufficient documentation

## 2018-05-16 DIAGNOSIS — R1310 Dysphagia, unspecified: Secondary | ICD-10-CM | POA: Diagnosis not present

## 2018-05-16 DIAGNOSIS — K2950 Unspecified chronic gastritis without bleeding: Secondary | ICD-10-CM | POA: Diagnosis not present

## 2018-05-16 DIAGNOSIS — I480 Paroxysmal atrial fibrillation: Secondary | ICD-10-CM | POA: Insufficient documentation

## 2018-05-16 HISTORY — DX: Other cervical disc degeneration, unspecified cervical region: M50.30

## 2018-05-16 HISTORY — DX: Heart failure, unspecified: I50.9

## 2018-05-16 HISTORY — DX: Cardiac arrhythmia, unspecified: I49.9

## 2018-05-16 HISTORY — DX: Polyneuropathy, unspecified: G62.9

## 2018-05-16 HISTORY — PX: COLONOSCOPY WITH PROPOFOL: SHX5780

## 2018-05-16 HISTORY — PX: ESOPHAGOGASTRODUODENOSCOPY (EGD) WITH PROPOFOL: SHX5813

## 2018-05-16 HISTORY — DX: Paroxysmal atrial fibrillation: I48.0

## 2018-05-16 HISTORY — DX: Atherosclerosis of aorta: I70.0

## 2018-05-16 HISTORY — DX: Cerebral infarction, unspecified: I63.9

## 2018-05-16 LAB — GLUCOSE, CAPILLARY: Glucose-Capillary: 157 mg/dL — ABNORMAL HIGH (ref 70–99)

## 2018-05-16 SURGERY — COLONOSCOPY WITH PROPOFOL
Anesthesia: General

## 2018-05-16 MED ORDER — SODIUM CHLORIDE 0.9 % IV SOLN: INTRAVENOUS | Status: DC

## 2018-05-16 MED ORDER — GLYCOPYRROLATE 0.2 MG/ML IJ SOLN
INTRAMUSCULAR | Status: AC
  Filled 2018-05-16: qty 1

## 2018-05-16 MED ORDER — PIPERACILLIN-TAZOBACTAM 3.375 G IVPB
INTRAVENOUS | Status: AC
  Administered 2018-05-16: 3.375 g via INTRAVENOUS
  Filled 2018-05-16: qty 50

## 2018-05-16 MED ORDER — PROPOFOL 500 MG/50ML IV EMUL
INTRAVENOUS | Status: DC | PRN
  Administered 2018-05-16: 50 ug/kg/min via INTRAVENOUS

## 2018-05-16 MED ORDER — LIDOCAINE HCL (PF) 2 % IJ SOLN
INTRAMUSCULAR | Status: DC | PRN
  Administered 2018-05-16: 100 mg

## 2018-05-16 MED ORDER — FENTANYL CITRATE (PF) 100 MCG/2ML IJ SOLN
INTRAMUSCULAR | Status: AC
  Filled 2018-05-16: qty 2

## 2018-05-16 MED ORDER — PROPOFOL 500 MG/50ML IV EMUL
INTRAVENOUS | Status: AC
  Filled 2018-05-16: qty 50

## 2018-05-16 MED ORDER — SODIUM CHLORIDE 0.9 % IV SOLN
INTRAVENOUS | Status: DC
  Administered 2018-05-16: 08:00:00 via INTRAVENOUS

## 2018-05-16 MED ORDER — MIDAZOLAM HCL 5 MG/5ML IJ SOLN
INTRAMUSCULAR | Status: DC | PRN
  Administered 2018-05-16: 2 mg via INTRAVENOUS

## 2018-05-16 MED ORDER — GLYCOPYRROLATE 0.2 MG/ML IJ SOLN
INTRAMUSCULAR | Status: DC | PRN
  Administered 2018-05-16: 0.2 mg via INTRAVENOUS

## 2018-05-16 MED ORDER — FENTANYL CITRATE (PF) 100 MCG/2ML IJ SOLN
INTRAMUSCULAR | Status: DC | PRN
  Administered 2018-05-16 (×2): 50 ug via INTRAVENOUS

## 2018-05-16 MED ORDER — PROPOFOL 10 MG/ML IV BOLUS
INTRAVENOUS | Status: DC | PRN
  Administered 2018-05-16 (×2): 20 mg via INTRAVENOUS
  Administered 2018-05-16: 10 mg via INTRAVENOUS

## 2018-05-16 MED ORDER — PIPERACILLIN-TAZOBACTAM 3.375 G IVPB
3.3750 g | Freq: Once | INTRAVENOUS | Status: AC
  Administered 2018-05-16: 3.375 g via INTRAVENOUS

## 2018-05-16 MED ORDER — LIDOCAINE HCL (PF) 2 % IJ SOLN
INTRAMUSCULAR | Status: AC
  Filled 2018-05-16: qty 10

## 2018-05-16 MED ORDER — MIDAZOLAM HCL 2 MG/2ML IJ SOLN
INTRAMUSCULAR | Status: AC
Start: 2018-05-16 — End: 2018-05-16
  Filled 2018-05-16: qty 2

## 2018-05-16 NOTE — Transfer of Care (Signed)
Immediate Anesthesia Transfer of Care Note  Patient: [name]  Procedure(s) Performed: COLONOSCOPY WITH PROPOFOL (N/A ) ESOPHAGOGASTRODUODENOSCOPY (EGD) WITH PROPOFOL (N/A )  Patient Location: PACU  Anesthesia Type:General  Level of Consciousness: sedated  Airway & Oxygen Therapy: Patient Spontanous Breathing and Patient connected to nasal cannula oxygen  Post-op Assessment: Report given to RN and Post -op Vital signs reviewed and stable  Post vital signs: Reviewed and stable  Last Vitals:  Vitals Value Taken Time  BP    Temp    Pulse 73 05/16/2018  9:01 AM  Resp 12 05/16/2018  9:01 AM  SpO2 96 % 05/16/2018  9:01 AM  Vitals shown include unvalidated device data.  Last Pain:  Vitals:   05/16/18 0810  TempSrc: Tympanic  PainSc: 0-No pain         Complications: No apparent anesthesia complications

## 2018-05-16 NOTE — Anesthesia Preprocedure Evaluation (Signed)
Anesthesia Evaluation  Patient identified by MRN, date of birth, ID band Patient awake    Reviewed: Allergy & Precautions, H&P , NPO status , Patient's Chart, lab work & pertinent test results  History of Anesthesia Complications Negative for: history of anesthetic complications  Airway Mallampati: III  TM Distance: <3 FB Neck ROM: limited    Dental  (+) Chipped, Poor Dentition, Missing, Edentulous Upper   Pulmonary neg shortness of breath, former smoker,           Cardiovascular Exercise Tolerance: Good hypertension, + CAD, + Peripheral Vascular Disease and +CHF  (-) Past MI + dysrhythmias Atrial Fibrillation      Neuro/Psych TIA Neuromuscular disease CVA, Residual Symptoms negative neurological ROS  negative psych ROS   GI/Hepatic Neg liver ROS, GERD  Controlled and Medicated,  Endo/Other  diabetes, Type 2  Renal/GU negative Renal ROS  negative genitourinary   Musculoskeletal  (+) Arthritis ,   Abdominal   Peds  Hematology negative hematology ROS (+)   Anesthesia Other Findings Past Medical History: No date: CHF (congestive heart failure) (HCC) No date: Chronic pain No date: Coronary artery disease No date: DDD (degenerative disc disease), hip No date: Diabetes mellitus without complication (HCC) No date: DJD (degenerative joint disease) No date: Dysrhythmia No date: GERD (gastroesophageal reflux disease) No date: Hyperlipemia No date: Hypertension No date: Insomnia No date: Neuropathy No date: Paroxysmal A-fib (HCC) No date: Peripheral neuropathy No date: Stroke [facility]) No date: Thoracic aortic atherosclerosis ([place])  Past Surgical History: No date: BACK SURGERY No date: CARDIAC SURGERY No date: CARDIAC VALVE REPLACEMENT 05/04/2017: CARDIOVERSION; N/A     Comment:  Procedure: CARDIOVERSION;  Surgeon: Corey Skains,               MD;  Location: [facility] ORS;  Service: Cardiovascular;    Laterality: N/A; No date: carpel tunnel release No date: CATARACT EXTRACTION W/ INTRAOCULAR LENS IMPLANT; Bilateral No date: COLONOSCOPY No date: COLONOSCOPY WITH ESOPHAGOGASTRODUODENOSCOPY (EGD) No date: JOINT REPLACEMENT 10/08/2016: TEE WITHOUT CARDIOVERSION; N/A     Comment:  Procedure: TRANSESOPHAGEAL ECHOCARDIOGRAM (TEE);                Surgeon: Teodoro Spray, MD;  Location: [facility] ORS;                Service: Cardiovascular;  Laterality: N/A; 11/23/2016: TEE WITHOUT CARDIOVERSION; N/A     Comment:  Procedure: TRANSESOPHAGEAL ECHOCARDIOGRAM (TEE);                Surgeon: Isaias Cowman, MD;  Location: [facility] ORS;                Service: Cardiovascular;  Laterality: N/A; No date: TONSILLECTOMY 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     Reproductive/Obstetrics negative OB ROS                             Anesthesia Physical Anesthesia Plan  ASA: IV  Anesthesia Plan: General   Post-op Pain Management:    Induction: Intravenous  PONV Risk Score and Plan: Propofol infusion and TIVA  Airway Management Planned: Natural Airway and Nasal Cannula  Additional Equipment:   Intra-op Plan:   Post-operative Plan:   Informed Consent: I have reviewed the patients History and Physical, chart, labs and discussed the procedure including the risks, benefits and alternatives for the proposed anesthesia with the patient or authorized representative who has indicated his/her understanding and acceptance.   Dental Advisory Given  Plan Discussed with: Anesthesiologist, CRNA and Surgeon  Anesthesia Plan Comments: (Patient consented for risks of anesthesia including but not limited to:  - adverse reactions to medications - risk of intubation if required - damage to teeth, lips or other oral mucosa - sore throat or hoarseness - Damage to heart, brain, lungs or loss of life  Patient voiced understanding.)        Anesthesia Quick Evaluation

## 2018-05-16 NOTE — H&P (Signed)
Primary Care Physician:  Idelle Crouch, MD Primary Gastroenterologist:  Dr. Vira Agar  Pre-Procedure History & Physical: HPI:  [name] is a 72 y.o. male is here for an endoscopy and colonoscopy.  Done for anemia.  Iron def.   Past Medical History:  Diagnosis Date  . CHF (congestive heart failure) ([place])   . Chronic pain   . Coronary artery disease   . DDD (degenerative disc disease), hip   . Diabetes mellitus without complication ([place])   . DJD (degenerative joint disease)   . Dysrhythmia   . GERD (gastroesophageal reflux disease)   . Hyperlipemia   . Hypertension   . Insomnia   . Neuropathy   . Paroxysmal A-fib ([place])   . Peripheral neuropathy   . Stroke ([place])   . Thoracic aortic atherosclerosis [facility])     Past Surgical History:  Procedure Laterality Date  . BACK SURGERY    . CARDIAC SURGERY    . CARDIAC VALVE REPLACEMENT    . CARDIOVERSION N/A 05/04/2017   Procedure: CARDIOVERSION;  Surgeon: Corey Skains, MD;  Location: [facility] ORS;  Service: Cardiovascular;  Laterality: N/A;  . carpel tunnel release    . CATARACT EXTRACTION W/ INTRAOCULAR LENS IMPLANT Bilateral   . COLONOSCOPY    . COLONOSCOPY WITH ESOPHAGOGASTRODUODENOSCOPY (EGD)    . JOINT REPLACEMENT    . TEE WITHOUT CARDIOVERSION N/A 10/08/2016   Procedure: TRANSESOPHAGEAL ECHOCARDIOGRAM (TEE);  Surgeon: Teodoro Spray, MD;  Location: [facility] ORS;  Service: Cardiovascular;  Laterality: N/A;  . TEE WITHOUT CARDIOVERSION N/A 11/23/2016   Procedure: TRANSESOPHAGEAL ECHOCARDIOGRAM (TEE);  Surgeon: Isaias Cowman, MD;  Location: [facility] ORS;  Service: Cardiovascular;  Laterality: N/A;  . TONSILLECTOMY    . VSD REPAIR      Prior to Admission medications   Medication Sig Start Date End Date Taking? Authorizing Provider  amiodarone (PACERONE) 200 MG tablet Take 1 tablet (200 mg total) by mouth daily. 10/09/16  Yes Fritzi Mandes, MD  aspirin EC 81 MG tablet Take 81 mg by mouth daily.   Yes [provider]  cyclobenzaprine (FLEXERIL) 10 MG tablet Take 10 mg by mouth daily as needed for muscle spasms.    Yes [provider]  DULoxetine (CYMBALTA) 30 MG capsule Take 30 mg by mouth every evening.   Yes [provider]  ferrous sulfate 325 (65 FE) MG tablet Take 1 tablet (325 mg total) by mouth 2 (two) times daily with a meal. 11/03/17  Yes Gladstone Lighter, MD  furosemide (LASIX) 40 MG tablet Take 40 mg by mouth daily.  03/19/16  Yes [provider]  gabapentin (NEURONTIN) 600 MG tablet Take 600 mg by mouth 2 (two) times daily.   Yes [provider]  lisinopril (PRINIVIL,ZESTRIL) 10 MG tablet Take 10 mg by mouth daily.   Yes [provider]  loratadine (CLARITIN) 10 MG tablet Take 10 mg by mouth daily.   Yes [provider]  metFORMIN (GLUCOPHAGE) 1000 MG tablet Take 1,000 mg by mouth 2 (two) times daily. With meals. 03/19/16  Yes [provider]  methadone (DOLOPHINE) 5 MG tablet Take 5 mg by mouth 2 (two) times daily.  09/02/16  Yes [provider]  metoprolol tartrate (LOPRESSOR) 25 MG tablet Take 25 mg by mouth 2 (two) times daily.  07/20/16  Yes [provider]  Multiple Vitamin (MULTI-VITAMINS) TABS Take 1 tablet by mouth daily.   Yes [provider]  pantoprazole (PROTONIX) 40 MG tablet Take 40  mg by mouth daily.   Yes [provider]  pravastatin (PRAVACHOL) 40 MG tablet Take 40 mg by mouth every evening.  02/10/16  Yes [provider]  warfarin (COUMADIN) 5 MG tablet Take 5 mg by mouth daily.   Yes [provider]  feeding supplement, ENSURE ENLIVE, (ENSURE ENLIVE) LIQD Take 237 mLs by mouth 2 (two) times daily between meals. Patient not taking: Reported on 05/16/2018 10/09/16   Fritzi Mandes, MD    Allergies as of 03/16/2018 - Review Complete 11/19/2017  Allergen Reaction Noted  . Tetracyclines & related Hives 10/02/2016    Family History  Problem Relation Age of Onset  .  Stroke Mother   . Heart attack Mother   . Heart attack Father     Social History   Socioeconomic History  . Marital status: Married    Spouse name: Not on file  . Number of children: Not on file  . Years of education: Not on file  . Highest education level: Not on file  Occupational History  . Not on file  Social Needs  . Financial resource strain: Not on file  . Food insecurity:    Worry: Not on file    Inability: Not on file  . Transportation needs:    Medical: Not on file    Non-medical: Not on file  Tobacco Use  . Smoking status: Former Research scientist (life sciences)  . Smokeless tobacco: Never Used  Substance and Sexual Activity  . Alcohol use: Yes    Alcohol/week: 0.6 oz    Types: 1 Shots of liquor per week  . Drug use: No  . Sexual activity: Yes  Lifestyle  . Physical activity:    Days per week: Not on file    Minutes per session: Not on file  . Stress: Not on file  Relationships  . Social connections:    Talks on phone: Not on file    Gets together: Not on file    Attends religious service: Not on file    Active member of club or organization: Not on file    Attends meetings of clubs or organizations: Not on file    Relationship status: Not on file  . Intimate partner violence:    Fear of current or ex partner: Not on file    Emotionally abused: Not on file    Physically abused: Not on file    Forced sexual activity: Not on file  Other Topics Concern  . Not on file  Social History Narrative  . Not on file    Review of Systems: See HPI, otherwise negative ROS  Physical Exam: There were no vitals taken for this visit. General:   Alert,  pleasant and cooperative in NAD Head:  Normocephalic and atraumatic. Neck:  Supple; no masses or thyromegaly. Lungs:  Clear throughout to auscultation.    Heart:  Regular rate and rhythm. Abdomen:  Soft, nontender and nondistended. Normal bowel sounds, without guarding, and without rebound.   Neurologic:  Alert and  oriented x4;   grossly normal neurologically.  Impression/Plan: [name] is here for an endoscopy and colonoscopy to be performed for anemia.  Risks, benefits, limitations, and alternatives regarding  endoscopy and colonoscopy have been reviewed with the patient.  Questions have been answered.  All parties agreeable.   Gaylyn Cheers, MD  05/16/2018, 8:05 AM

## 2018-05-16 NOTE — Op Note (Signed)
[facility] Gastroenterology Patient Name: [name] Procedure Date: 05/16/2018 8:04 AM MRN: [id number] Account #: [id number] Date of Birth: [name] 23, 1946 Admit Type: Outpatient Age: 72 Room: [facility] ENDO ROOM 1 Gender: Male Note Status: Finalized Procedure:            Upper GI endoscopy Indications:          Dysphagia Providers:            Manya Silvas, MD Referring MD:         Leonie Douglas. Doy Hutching, MD (Referring MD) Medicines:            General Anesthesia, Propofol per Anesthesia Complications:        No immediate complications. Procedure:            Pre-Anesthesia Assessment:                       - After reviewing the risks and benefits, the patient                        was deemed in satisfactory condition to undergo the                        procedure.                       After obtaining informed consent, the endoscope was                        passed under direct vision. Throughout the procedure,                        the patient's blood pressure, pulse, and oxygen                        saturations were monitored continuously. The Endoscope                        was introduced through the mouth, and advanced to the                        second part of duodenum. The upper GI endoscopy was                        accomplished without difficulty. The patient tolerated                        the procedure well. Findings:      A mild Schatzki ring was found at the gastroesophageal junction. GEJ at       43cm. At the end of the exam A guidewire was placed and the scope was       withdrawn. Dilation was performed with a Savary dilator with mild       resistance at 17 mm.      Patchy mildly erythematous mucosa was found in the gastric antrum.       Biopsies were taken with a cold forceps for histology. Biopsies were       taken with a cold forceps for Helicobacter pylori testing.      The examined duodenum was normal. Impression:           - Mild  Schatzki ring. Dilated.                       -  Erythematous mucosa in the antrum. Biopsied.                       - Normal examined duodenum. Recommendation:       - Await pathology results. Take medicine for acid                        reflux. Omeprazole 40mg  once a day. Manya Silvas, MD 05/16/2018 8:41:51 AM This report has been signed electronically. Number of Addenda: 0 Note Initiated On: 05/16/2018 8:04 AM      [facility]

## 2018-05-16 NOTE — Anesthesia Post-op Follow-up Note (Signed)
Anesthesia QCDR form completed.        

## 2018-05-16 NOTE — Op Note (Signed)
[facility] Gastroenterology Patient Name: [name] Procedure Date: 05/16/2018 8:03 AM MRN: [id number] Account #: [id number] Date of Birth: 01-22-1946 Admit Type: Outpatient Age: 72 Room: [facility] ENDO ROOM 1 Gender: Male Note Status: Finalized Procedure:            Colonoscopy Indications:          Iron deficiency anemia Providers:            Manya Silvas, MD Referring MD:         Leonie Douglas. Doy Hutching, MD (Referring MD) Medicines:            Propofol per Anesthesia Complications:        No immediate complications. Procedure:            Pre-Anesthesia Assessment:                       - After reviewing the risks and benefits, the patient                        was deemed in satisfactory condition to undergo the                        procedure.                       After obtaining informed consent, the colonoscope was                        passed under direct vision. Throughout the procedure,                        the patient's blood pressure, pulse, and oxygen                        saturations were monitored continuously. The                        Colonoscope was introduced through the anus and                        advanced to the the cecum, identified by appendiceal                        orifice and ileocecal valve. The colonoscopy was                        performed without difficulty. The patient tolerated the                        procedure well. The quality of the bowel preparation                        was good. Findings:      A small polyp was found in the distal transverse colon. The polyp was       sessile. The polyp was removed with a hot snare. Resection and retrieval       were complete.      Internal hemorrhoids were found during endoscopy. The hemorrhoids were       small and Grade I (internal hemorrhoids that do not prolapse).      The exam was otherwise  without abnormality. Impression:           - One small polyp in the distal  transverse colon,                        removed with a hot snare. Resected and retrieved.                       - Internal hemorrhoids.                       - The examination was otherwise normal. Recommendation:       - Await pathology results. Manya Silvas, MD 05/16/2018 8:59:33 AM This report has been signed electronically. Number of Addenda: 0 Note Initiated On: 05/16/2018 8:03 AM Scope Withdrawal Time: 0 hours 8 minutes 46 seconds  Total Procedure Duration: 0 hours 13 minutes 0 seconds       [facility]

## 2018-05-16 NOTE — Anesthesia Postprocedure Evaluation (Signed)
Anesthesia Post Note  Patient: MAJID MCCRAVY  Procedure(s) Performed: COLONOSCOPY WITH PROPOFOL (N/A ) ESOPHAGOGASTRODUODENOSCOPY (EGD) WITH PROPOFOL (N/A )  Patient location during evaluation: Endoscopy Anesthesia Type: General Level of consciousness: awake and alert Pain management: pain level controlled Vital Signs Assessment: post-procedure vital signs reviewed and stable Respiratory status: spontaneous breathing, nonlabored ventilation, respiratory function stable and patient connected to nasal cannula oxygen Cardiovascular status: blood pressure returned to baseline and stable Postop Assessment: no apparent nausea or vomiting Anesthetic complications: no     Last Vitals:  Vitals:   05/16/18 0922 05/16/18 0932  BP: (!) 145/81 (!) 142/98  Pulse: 72 70  Resp: 15 13  Temp:    SpO2: 96% 96%    Last Pain:  Vitals:   05/16/18 0932  TempSrc:   PainSc: 0-No pain                 Precious Haws Loucille Takach

## 2018-05-17 LAB — SURGICAL PATHOLOGY

## 2018-05-24 DIAGNOSIS — I350 Nonrheumatic aortic (valve) stenosis: Secondary | ICD-10-CM | POA: Diagnosis not present

## 2018-06-07 DIAGNOSIS — R791 Abnormal coagulation profile: Secondary | ICD-10-CM | POA: Diagnosis not present

## 2018-06-16 DIAGNOSIS — M545 Low back pain: Secondary | ICD-10-CM | POA: Diagnosis not present

## 2018-06-16 DIAGNOSIS — G894 Chronic pain syndrome: Secondary | ICD-10-CM | POA: Diagnosis not present

## 2018-07-07 DIAGNOSIS — I350 Nonrheumatic aortic (valve) stenosis: Secondary | ICD-10-CM | POA: Diagnosis not present

## 2018-07-26 DIAGNOSIS — E1151 Type 2 diabetes mellitus with diabetic peripheral angiopathy without gangrene: Secondary | ICD-10-CM | POA: Diagnosis not present

## 2018-07-26 DIAGNOSIS — I10 Essential (primary) hypertension: Secondary | ICD-10-CM | POA: Diagnosis not present

## 2018-07-26 DIAGNOSIS — T826XXD Infection and inflammatory reaction due to cardiac valve prosthesis, subsequent encounter: Secondary | ICD-10-CM | POA: Diagnosis not present

## 2018-07-26 DIAGNOSIS — I6523 Occlusion and stenosis of bilateral carotid arteries: Secondary | ICD-10-CM | POA: Diagnosis not present

## 2018-07-26 DIAGNOSIS — I38 Endocarditis, valve unspecified: Secondary | ICD-10-CM | POA: Diagnosis not present

## 2018-07-26 DIAGNOSIS — Z952 Presence of prosthetic heart valve: Secondary | ICD-10-CM | POA: Diagnosis not present

## 2018-07-26 DIAGNOSIS — I350 Nonrheumatic aortic (valve) stenosis: Secondary | ICD-10-CM | POA: Diagnosis not present

## 2018-07-26 DIAGNOSIS — R600 Localized edema: Secondary | ICD-10-CM | POA: Diagnosis not present

## 2018-07-26 DIAGNOSIS — I358 Other nonrheumatic aortic valve disorders: Secondary | ICD-10-CM | POA: Diagnosis not present

## 2018-07-26 DIAGNOSIS — I6310 Cerebral infarction due to embolism of unspecified precerebral artery: Secondary | ICD-10-CM | POA: Diagnosis not present

## 2018-07-26 DIAGNOSIS — I2510 Atherosclerotic heart disease of native coronary artery without angina pectoris: Secondary | ICD-10-CM | POA: Diagnosis not present

## 2018-07-26 DIAGNOSIS — I480 Paroxysmal atrial fibrillation: Secondary | ICD-10-CM | POA: Diagnosis not present

## 2018-07-27 ENCOUNTER — Other Ambulatory Visit: Payer: Self-pay | Admitting: Unknown Physician Specialty

## 2018-07-27 DIAGNOSIS — Z125 Encounter for screening for malignant neoplasm of prostate: Secondary | ICD-10-CM | POA: Diagnosis not present

## 2018-07-27 DIAGNOSIS — Z952 Presence of prosthetic heart valve: Secondary | ICD-10-CM | POA: Diagnosis not present

## 2018-07-27 DIAGNOSIS — I10 Essential (primary) hypertension: Secondary | ICD-10-CM | POA: Diagnosis not present

## 2018-07-27 DIAGNOSIS — E1151 Type 2 diabetes mellitus with diabetic peripheral angiopathy without gangrene: Secondary | ICD-10-CM | POA: Diagnosis not present

## 2018-07-27 DIAGNOSIS — Z23 Encounter for immunization: Secondary | ICD-10-CM | POA: Diagnosis not present

## 2018-07-27 DIAGNOSIS — E782 Mixed hyperlipidemia: Secondary | ICD-10-CM | POA: Diagnosis not present

## 2018-07-27 DIAGNOSIS — R748 Abnormal levels of other serum enzymes: Secondary | ICD-10-CM

## 2018-07-27 DIAGNOSIS — Z79899 Other long term (current) drug therapy: Secondary | ICD-10-CM | POA: Diagnosis not present

## 2018-07-29 DIAGNOSIS — M5136 Other intervertebral disc degeneration, lumbar region: Secondary | ICD-10-CM | POA: Diagnosis not present

## 2018-07-29 DIAGNOSIS — M545 Low back pain: Secondary | ICD-10-CM | POA: Diagnosis not present

## 2018-07-29 DIAGNOSIS — Z79891 Long term (current) use of opiate analgesic: Secondary | ICD-10-CM | POA: Diagnosis not present

## 2018-07-29 DIAGNOSIS — G894 Chronic pain syndrome: Secondary | ICD-10-CM | POA: Diagnosis not present

## 2018-07-29 DIAGNOSIS — Z79899 Other long term (current) drug therapy: Secondary | ICD-10-CM | POA: Diagnosis not present

## 2018-08-02 ENCOUNTER — Ambulatory Visit
Admission: RE | Admit: 2018-08-02 | Discharge: 2018-08-02 | Disposition: A | Discharge status: Home / Self Care | Payer: PPO | Source: Ambulatory Visit | Attending: Unknown Physician Specialty | Admitting: Unknown Physician Specialty

## 2018-08-04 ENCOUNTER — Ambulatory Visit
Admission: RE | Admit: 2018-08-04 | Discharge: 2018-08-04 | Disposition: A | Discharge status: Home / Self Care | Payer: PPO | Source: Ambulatory Visit | Attending: Unknown Physician Specialty | Admitting: Unknown Physician Specialty

## 2018-08-04 DIAGNOSIS — R932 Abnormal findings on diagnostic imaging of liver and biliary tract: Secondary | ICD-10-CM | POA: Diagnosis not present

## 2018-08-04 DIAGNOSIS — R748 Abnormal levels of other serum enzymes: Secondary | ICD-10-CM

## 2018-08-08 DIAGNOSIS — I350 Nonrheumatic aortic (valve) stenosis: Secondary | ICD-10-CM | POA: Diagnosis not present

## 2018-08-08 DIAGNOSIS — Z125 Encounter for screening for malignant neoplasm of prostate: Secondary | ICD-10-CM | POA: Diagnosis not present

## 2018-08-08 DIAGNOSIS — E1151 Type 2 diabetes mellitus with diabetic peripheral angiopathy without gangrene: Secondary | ICD-10-CM | POA: Diagnosis not present

## 2018-08-08 DIAGNOSIS — Z79899 Other long term (current) drug therapy: Secondary | ICD-10-CM | POA: Diagnosis not present

## 2018-08-10 DIAGNOSIS — D509 Iron deficiency anemia, unspecified: Secondary | ICD-10-CM | POA: Diagnosis not present

## 2018-08-10 DIAGNOSIS — K760 Fatty (change of) liver, not elsewhere classified: Secondary | ICD-10-CM | POA: Insufficient documentation

## 2018-08-10 DIAGNOSIS — E1151 Type 2 diabetes mellitus with diabetic peripheral angiopathy without gangrene: Secondary | ICD-10-CM | POA: Diagnosis not present

## 2018-08-10 DIAGNOSIS — R748 Abnormal levels of other serum enzymes: Secondary | ICD-10-CM | POA: Diagnosis not present

## 2018-09-05 DIAGNOSIS — M545 Low back pain: Secondary | ICD-10-CM | POA: Diagnosis not present

## 2018-09-05 DIAGNOSIS — G894 Chronic pain syndrome: Secondary | ICD-10-CM | POA: Diagnosis not present

## 2018-09-05 DIAGNOSIS — M5136 Other intervertebral disc degeneration, lumbar region: Secondary | ICD-10-CM | POA: Diagnosis not present

## 2018-09-08 DIAGNOSIS — I350 Nonrheumatic aortic (valve) stenosis: Secondary | ICD-10-CM | POA: Diagnosis not present

## 2018-09-09 ENCOUNTER — Encounter: Payer: PPO | Attending: Nurse Practitioner | Admitting: Dietician

## 2018-09-09 ENCOUNTER — Encounter: Payer: Self-pay | Admitting: Dietician

## 2018-09-09 VITALS — Ht 70.0 in | Wt 281.1 lb

## 2018-09-09 DIAGNOSIS — E1140 Type 2 diabetes mellitus with diabetic neuropathy, unspecified: Secondary | ICD-10-CM

## 2018-09-09 DIAGNOSIS — Z713 Dietary counseling and surveillance: Secondary | ICD-10-CM | POA: Insufficient documentation

## 2018-09-09 DIAGNOSIS — E1151 Type 2 diabetes mellitus with diabetic peripheral angiopathy without gangrene: Secondary | ICD-10-CM | POA: Diagnosis not present

## 2018-09-09 DIAGNOSIS — Z6841 Body Mass Index (BMI) 40.0 and over, adult: Secondary | ICD-10-CM | POA: Diagnosis not present

## 2018-09-09 DIAGNOSIS — K760 Fatty (change of) liver, not elsewhere classified: Secondary | ICD-10-CM | POA: Insufficient documentation

## 2018-09-09 NOTE — Progress Notes (Signed)
Medical Nutrition Therapy: Visit start time: 0900  end time: 1000  Assessment:  Diagnosis: Fatty Liver Disease, DM type II Past medical history: ETOH overuse, CHF, HTN, HLD, Obesity,DJD, Anemia, see chart Psychosocial issues/ stress concerns: None reported  Preferred learning method:  . Auditory . Hands-on  Current weight: 281.1#  Height: 5' 10"  Medications, supplements: Coumadin, Iron, Lasix, Metformin, Gabapentin, Methadone, MVI, Pravastatin, Lopressor, Lisinopril, Aspirin, CBD, see chart for full list  Progress and evaluation: Pt is interested in dietary education for fatty liver disease as at this time he is unsure of ways to manipulate diet in order to manage this condition. States he has attended DM type II education classes at our location in the past and is familiar with nutrition therapies for DM type II. However, he has stopped checking his blood sugars, as in the past they "were all within the same range" (140-165). Most recent HgbA1c of 7.8 H (08/08/18) reflects decrease in DM control HgbA1c 91/15/19 6.5 H. He tries to limit CHO and added sugars in his diet and has gone as low as 20g of CHO per day for diets in the past. He does not deep fry foods at home and cooks with either olive oil or Smart Balance butter. His wife is responsible for most of the cooking and food shopping. She provides portion-controlled snacks for pt such as a serving of crackers + cheese. They try not to keep processed snack foods in the house.   Physical activity: walks ~1/4 mile per day; back, foot and knee pain limits his ability to be active  Dietary Intake:  Usual eating pattern includes 2-3 meals and 0-1 snacks per day. Dining out frequency: 0-2 meals per week.  Breakfast: Honey Nut Cheerios with 1% milk or two waffles with SF syrup Snack: none Lunch: doesn't always eat lunch. Bologna or PB&J wrap in a wheat tortilla (8g cho) Snack: none Supper: Meat (burger/ pork chop/ chicken) + 1-2 vegetables  (broccoli, beans, peas) + occasionally a starch (rice, mac n' cheese, spaghetti). If out: Chilis (chicken tenders, steak, ribs) Snack: occasionally; apple + peanut butter, fruit, ice cream, pretzels Beverages: orange juice at breakfast, coffee with SF creamer, green tea, diet coke, beer occasionally - has cut back significantly on ETOH  Nutrition Care Education: Topics covered: healthy and unhealthy fats, dietary sources of fiber, fatty liver disease and link to diabetes Basic nutrition: basic food groups, appropriate nutrient balance, appropriate meal and snack schedule, general nutrition guidelines    Weight control: benefits of weight control, behavioral changes for weight loss Advanced nutrition: cooking techniques, food label reading Diabetes: goals for BGs, appropriate meal and snack schedule, carb counting, appropriate carb intake and balance Other lifestyle changes: benefits of making changes, increasing motivation, readiness for change, identifying habits that need to change, alcohol use  Nutritional Diagnosis:  [place]-2.1 Inpaired nutrition utilization As related to dx of DM type II and Fatty Liver Disease.  As evidenced by HgbA1c 7.8, h/o ETOH abuse per patient.  Intervention: Discussion as noted above. Pt would benefit from dietary interventions such as not skipping lunch, adding a source of protein at breakfast, choosing foods without trans fats, limiting saturated fats, and including more unsaturated fats / omega-3 fatty acids. He is open to implementing these changes, as he appears motivated to slow progression of liver disease and improve HgbA1c value.  Education Materials given:  . General diet guidelines for Heart health/ Fatty Liver Disease . Goals/ instructions  Learner/ who was taught:  . Patient  Level of understanding: Marland Kitchen Verbalizes/ demonstrates competency  Demonstrated degree of understanding via:   Teach back Learning barriers: . None  Willingness to learn/  readiness for change: . Acceptance, ready for change  Monitoring and Evaluation:  Dietary intake, exercise, HgbA1c, liver disease, and body weight      follow up: prn

## 2018-09-20 DIAGNOSIS — L97421 Non-pressure chronic ulcer of left heel and midfoot limited to breakdown of skin: Secondary | ICD-10-CM | POA: Diagnosis not present

## 2018-09-20 DIAGNOSIS — B351 Tinea unguium: Secondary | ICD-10-CM | POA: Diagnosis not present

## 2018-09-20 DIAGNOSIS — E1140 Type 2 diabetes mellitus with diabetic neuropathy, unspecified: Secondary | ICD-10-CM | POA: Diagnosis not present

## 2018-10-04 DIAGNOSIS — L03116 Cellulitis of left lower limb: Secondary | ICD-10-CM | POA: Diagnosis not present

## 2018-10-04 DIAGNOSIS — E1151 Type 2 diabetes mellitus with diabetic peripheral angiopathy without gangrene: Secondary | ICD-10-CM | POA: Diagnosis not present

## 2018-10-04 DIAGNOSIS — E1140 Type 2 diabetes mellitus with diabetic neuropathy, unspecified: Secondary | ICD-10-CM | POA: Diagnosis not present

## 2018-10-04 DIAGNOSIS — L97421 Non-pressure chronic ulcer of left heel and midfoot limited to breakdown of skin: Secondary | ICD-10-CM | POA: Diagnosis not present

## 2018-10-06 DIAGNOSIS — E1151 Type 2 diabetes mellitus with diabetic peripheral angiopathy without gangrene: Secondary | ICD-10-CM | POA: Diagnosis not present

## 2018-10-06 DIAGNOSIS — L03116 Cellulitis of left lower limb: Secondary | ICD-10-CM | POA: Diagnosis not present

## 2018-10-06 DIAGNOSIS — E1140 Type 2 diabetes mellitus with diabetic neuropathy, unspecified: Secondary | ICD-10-CM | POA: Diagnosis not present

## 2018-10-06 DIAGNOSIS — L97421 Non-pressure chronic ulcer of left heel and midfoot limited to breakdown of skin: Secondary | ICD-10-CM | POA: Diagnosis not present

## 2018-10-10 DIAGNOSIS — I350 Nonrheumatic aortic (valve) stenosis: Secondary | ICD-10-CM | POA: Diagnosis not present

## 2018-10-25 DIAGNOSIS — E1140 Type 2 diabetes mellitus with diabetic neuropathy, unspecified: Secondary | ICD-10-CM | POA: Diagnosis not present

## 2018-10-25 DIAGNOSIS — L97421 Non-pressure chronic ulcer of left heel and midfoot limited to breakdown of skin: Secondary | ICD-10-CM | POA: Diagnosis not present

## 2018-10-26 DIAGNOSIS — E1151 Type 2 diabetes mellitus with diabetic peripheral angiopathy without gangrene: Secondary | ICD-10-CM | POA: Diagnosis not present

## 2018-10-26 DIAGNOSIS — K760 Fatty (change of) liver, not elsewhere classified: Secondary | ICD-10-CM | POA: Diagnosis not present

## 2018-10-26 DIAGNOSIS — I10 Essential (primary) hypertension: Secondary | ICD-10-CM | POA: Diagnosis not present

## 2018-10-26 DIAGNOSIS — Z79899 Other long term (current) drug therapy: Secondary | ICD-10-CM | POA: Diagnosis not present

## 2018-10-26 DIAGNOSIS — E782 Mixed hyperlipidemia: Secondary | ICD-10-CM | POA: Diagnosis not present

## 2018-10-26 DIAGNOSIS — Z0000 Encounter for general adult medical examination without abnormal findings: Secondary | ICD-10-CM | POA: Diagnosis not present

## 2018-10-26 DIAGNOSIS — I350 Nonrheumatic aortic (valve) stenosis: Secondary | ICD-10-CM | POA: Diagnosis not present

## 2018-10-26 DIAGNOSIS — G894 Chronic pain syndrome: Secondary | ICD-10-CM | POA: Diagnosis not present

## 2018-10-26 DIAGNOSIS — R748 Abnormal levels of other serum enzymes: Secondary | ICD-10-CM | POA: Diagnosis not present

## 2018-10-26 DIAGNOSIS — I480 Paroxysmal atrial fibrillation: Secondary | ICD-10-CM | POA: Diagnosis not present

## 2018-11-02 DIAGNOSIS — M5136 Other intervertebral disc degeneration, lumbar region: Secondary | ICD-10-CM | POA: Diagnosis not present

## 2018-11-02 DIAGNOSIS — G894 Chronic pain syndrome: Secondary | ICD-10-CM | POA: Diagnosis not present

## 2018-11-02 DIAGNOSIS — M545 Low back pain: Secondary | ICD-10-CM | POA: Diagnosis not present

## 2018-11-02 DIAGNOSIS — Z6835 Body mass index (BMI) 35.0-35.9, adult: Secondary | ICD-10-CM | POA: Diagnosis not present

## 2018-11-10 DIAGNOSIS — Z79899 Other long term (current) drug therapy: Secondary | ICD-10-CM | POA: Diagnosis not present

## 2018-11-10 DIAGNOSIS — I350 Nonrheumatic aortic (valve) stenosis: Secondary | ICD-10-CM | POA: Diagnosis not present

## 2018-11-10 DIAGNOSIS — E782 Mixed hyperlipidemia: Secondary | ICD-10-CM | POA: Diagnosis not present

## 2018-11-10 DIAGNOSIS — E1151 Type 2 diabetes mellitus with diabetic peripheral angiopathy without gangrene: Secondary | ICD-10-CM | POA: Diagnosis not present

## 2018-11-10 DIAGNOSIS — I10 Essential (primary) hypertension: Secondary | ICD-10-CM | POA: Diagnosis not present

## 2018-11-15 DIAGNOSIS — L97421 Non-pressure chronic ulcer of left heel and midfoot limited to breakdown of skin: Secondary | ICD-10-CM | POA: Diagnosis not present

## 2018-11-15 DIAGNOSIS — E1140 Type 2 diabetes mellitus with diabetic neuropathy, unspecified: Secondary | ICD-10-CM | POA: Diagnosis not present

## 2018-11-17 DIAGNOSIS — L89620 Pressure ulcer of left heel, unstageable: Secondary | ICD-10-CM | POA: Diagnosis not present

## 2018-11-24 DIAGNOSIS — R600 Localized edema: Secondary | ICD-10-CM | POA: Diagnosis not present

## 2018-11-24 DIAGNOSIS — E782 Mixed hyperlipidemia: Secondary | ICD-10-CM | POA: Diagnosis not present

## 2018-11-24 DIAGNOSIS — I2510 Atherosclerotic heart disease of native coronary artery without angina pectoris: Secondary | ICD-10-CM | POA: Diagnosis not present

## 2018-11-24 DIAGNOSIS — I480 Paroxysmal atrial fibrillation: Secondary | ICD-10-CM | POA: Diagnosis not present

## 2018-11-24 DIAGNOSIS — I350 Nonrheumatic aortic (valve) stenosis: Secondary | ICD-10-CM | POA: Diagnosis not present

## 2018-12-06 DIAGNOSIS — L97421 Non-pressure chronic ulcer of left heel and midfoot limited to breakdown of skin: Secondary | ICD-10-CM | POA: Diagnosis not present

## 2018-12-06 DIAGNOSIS — E1140 Type 2 diabetes mellitus with diabetic neuropathy, unspecified: Secondary | ICD-10-CM | POA: Diagnosis not present

## 2018-12-12 DIAGNOSIS — I350 Nonrheumatic aortic (valve) stenosis: Secondary | ICD-10-CM | POA: Diagnosis not present

## 2018-12-21 DIAGNOSIS — G894 Chronic pain syndrome: Secondary | ICD-10-CM | POA: Diagnosis not present

## 2018-12-21 DIAGNOSIS — M545 Low back pain: Secondary | ICD-10-CM | POA: Diagnosis not present

## 2018-12-21 DIAGNOSIS — M5136 Other intervertebral disc degeneration, lumbar region: Secondary | ICD-10-CM | POA: Diagnosis not present

## 2018-12-26 ENCOUNTER — Ambulatory Visit
Admission: RE | Admit: 2018-12-26 | Discharge: 2018-12-26 | Disposition: A | Discharge status: Home / Self Care | Payer: PPO | Source: Ambulatory Visit | Attending: Internal Medicine | Admitting: Internal Medicine

## 2018-12-26 ENCOUNTER — Other Ambulatory Visit: Payer: Self-pay | Admitting: Internal Medicine

## 2018-12-26 DIAGNOSIS — R1011 Right upper quadrant pain: Secondary | ICD-10-CM

## 2018-12-26 DIAGNOSIS — R112 Nausea with vomiting, unspecified: Secondary | ICD-10-CM | POA: Diagnosis not present

## 2018-12-26 DIAGNOSIS — I10 Essential (primary) hypertension: Secondary | ICD-10-CM | POA: Diagnosis not present

## 2018-12-28 ENCOUNTER — Other Ambulatory Visit: Payer: Self-pay

## 2018-12-28 ENCOUNTER — Encounter: Payer: Self-pay | Admitting: Emergency Medicine

## 2018-12-28 ENCOUNTER — Emergency Department: Payer: PPO

## 2018-12-28 ENCOUNTER — Emergency Department
Admission: EM | Admit: 2018-12-28 | Discharge: 2018-12-28 | Disposition: A | Discharge status: Home / Self Care | Payer: PPO | Attending: Emergency Medicine | Admitting: Emergency Medicine

## 2018-12-28 DIAGNOSIS — Z7982 Long term (current) use of aspirin: Secondary | ICD-10-CM | POA: Diagnosis not present

## 2018-12-28 DIAGNOSIS — I110 Hypertensive heart disease with heart failure: Secondary | ICD-10-CM | POA: Insufficient documentation

## 2018-12-28 DIAGNOSIS — I509 Heart failure, unspecified: Secondary | ICD-10-CM | POA: Insufficient documentation

## 2018-12-28 DIAGNOSIS — N281 Cyst of kidney, acquired: Secondary | ICD-10-CM | POA: Diagnosis not present

## 2018-12-28 DIAGNOSIS — R1011 Right upper quadrant pain: Secondary | ICD-10-CM | POA: Insufficient documentation

## 2018-12-28 DIAGNOSIS — Z79899 Other long term (current) drug therapy: Secondary | ICD-10-CM | POA: Insufficient documentation

## 2018-12-28 DIAGNOSIS — Z9660 Presence of unspecified orthopedic joint implant: Secondary | ICD-10-CM | POA: Diagnosis not present

## 2018-12-28 DIAGNOSIS — Z87891 Personal history of nicotine dependence: Secondary | ICD-10-CM | POA: Insufficient documentation

## 2018-12-28 DIAGNOSIS — E119 Type 2 diabetes mellitus without complications: Secondary | ICD-10-CM | POA: Diagnosis not present

## 2018-12-28 DIAGNOSIS — Z8673 Personal history of transient ischemic attack (TIA), and cerebral infarction without residual deficits: Secondary | ICD-10-CM | POA: Diagnosis not present

## 2018-12-28 DIAGNOSIS — Z7901 Long term (current) use of anticoagulants: Secondary | ICD-10-CM | POA: Diagnosis not present

## 2018-12-28 DIAGNOSIS — K449 Diaphragmatic hernia without obstruction or gangrene: Secondary | ICD-10-CM | POA: Diagnosis not present

## 2018-12-28 DIAGNOSIS — I2510 Atherosclerotic heart disease of native coronary artery without angina pectoris: Secondary | ICD-10-CM | POA: Insufficient documentation

## 2018-12-28 LAB — CBC
HEMATOCRIT: 40.9 % (ref 39.0–52.0)
Hemoglobin: 12.9 g/dL — ABNORMAL LOW (ref 13.0–17.0)
MCH: 28.2 pg (ref 26.0–34.0)
MCHC: 31.5 g/dL (ref 30.0–36.0)
MCV: 89.3 fL (ref 80.0–100.0)
NRBC: 0 % (ref 0.0–0.2)
Platelets: 258 10*3/uL (ref 150–400)
RBC: 4.58 MIL/uL (ref 4.22–5.81)
RDW: 14.9 % (ref 11.5–15.5)
WBC: 8.7 10*3/uL (ref 4.0–10.5)

## 2018-12-28 LAB — COMPREHENSIVE METABOLIC PANEL
ALT: 39 U/L (ref 0–44)
AST: 48 U/L — ABNORMAL HIGH (ref 15–41)
Albumin: 4 g/dL (ref 3.5–5.0)
Alkaline Phosphatase: 115 U/L (ref 38–126)
Anion gap: 11 (ref 5–15)
BUN: 28 mg/dL — ABNORMAL HIGH (ref 8–23)
CHLORIDE: 99 mmol/L (ref 98–111)
CO2: 27 mmol/L (ref 22–32)
Calcium: 9 mg/dL (ref 8.9–10.3)
Creatinine, Ser: 1.08 mg/dL (ref 0.61–1.24)
GFR calc Af Amer: >60 mL/min (ref >60)
Glucose, Bld: 134 mg/dL — ABNORMAL HIGH (ref 70–99)
Potassium: 4.3 mmol/L (ref 3.5–5.1)
Sodium: 137 mmol/L (ref 135–145)
Total Bilirubin: 0.6 mg/dL (ref 0.3–1.2)
Total Protein: 7.8 g/dL (ref 6.5–8.1)

## 2018-12-28 LAB — LIPASE, BLOOD: Lipase: 33 U/L (ref 11–51)

## 2018-12-28 MED ORDER — OXYCODONE-ACETAMINOPHEN 5-325 MG PO TABS: 1.0000 | ORAL_TABLET | ORAL | 0 refills | Status: AC | PRN

## 2018-12-28 MED ORDER — ONDANSETRON HCL 4 MG/2ML IJ SOLN
4.0000 mg | Freq: Once | INTRAMUSCULAR | Status: AC
  Administered 2018-12-28: 4 mg via INTRAVENOUS
  Filled 2018-12-28: qty 2

## 2018-12-28 MED ORDER — IOHEXOL 300 MG/ML  SOLN
100.0000 mL | Freq: Once | INTRAMUSCULAR | Status: AC | PRN
  Administered 2018-12-28: 100 mL via INTRAVENOUS

## 2018-12-28 MED ORDER — ONDANSETRON 4 MG PO TBDP: 4.0000 mg | ORAL_TABLET | Freq: Once | ORAL | Status: DC | PRN

## 2018-12-28 MED ORDER — IOPAMIDOL (ISOVUE-300) INJECTION 61%
30.0000 mL | Freq: Once | INTRAVENOUS | Status: AC | PRN
  Administered 2018-12-28: 30 mL via ORAL

## 2018-12-28 MED ORDER — SODIUM CHLORIDE 0.9% FLUSH
3.0000 mL | Freq: Once | INTRAVENOUS | Status: AC
  Administered 2018-12-28: 3 mL via INTRAVENOUS

## 2018-12-28 MED ORDER — MORPHINE SULFATE (PF) 4 MG/ML IV SOLN
4.0000 mg | Freq: Once | INTRAVENOUS | Status: AC
  Administered 2018-12-28: 4 mg via INTRAVENOUS
  Filled 2018-12-28: qty 1

## 2018-12-28 NOTE — ED Notes (Signed)
FIRST NURSE NOTE: pt to ED from same day surgery with his pastor reporting severe abd pain. Pt holding an emesis bag at this time but appears to be in NAD and able to answer all questions calmly and appropriatly. No SOB noted, no diaphoresis, color appears appropriate in lobby.

## 2018-12-28 NOTE — ED Notes (Signed)
CT reports pt had vomiting during CT scan. MD made aware.

## 2018-12-28 NOTE — ED Notes (Signed)
Patient transported to CT 

## 2018-12-28 NOTE — ED Notes (Signed)
Patient aware of need for urine sample. Urinal at bedside.

## 2018-12-28 NOTE — ED Triage Notes (Signed)
Says right upper quadrant pain with dry heaves.  Has seen dr and had ultrasound and blood work and they cannot find out what it is.  Says it is worse.

## 2018-12-28 NOTE — ED Provider Notes (Addendum)
[facility] Emergency Department Provider Note   ____________________________________________    I have reviewed the triage vital signs and the nursing notes.   HISTORY  Chief Complaint Abdominal Pain and Nausea     HPI [name] is a 73 y.o. male who presents with complaints of abdominal pain.  Patient describes right periumbilical abdominal pain which is been ongoing for several days now.  He saw his PCP who ordered an ultrasound which was apparently unremarkable.  Also had a chest x-ray which was benign.  Patient is never had this pain before.  Is not take anything for this.  Seems to be worse with movement.  No fevers or chills.  No dysuria or hematuria.  No history of abdominal surgery  Past Medical History:  Diagnosis Date  . CHF (congestive heart failure) ([place])   . Chronic pain   . Coronary artery disease   . DDD (degenerative disc disease), hip   . Diabetes mellitus without complication ([place])   . DJD (degenerative joint disease)   . Dysrhythmia   . GERD (gastroesophageal reflux disease)   . Hyperlipemia   . Hypertension   . Insomnia   . Neuropathy   . Paroxysmal A-fib ([place])   . Peripheral neuropathy   . Stroke ([place])   . Thoracic aortic atherosclerosis [facility])     Patient Active Problem List   Diagnosis Date Noted  . Anemia 11/02/2017  . TIA (transient ischemic attack) 12/15/2016  . CVA (cerebral vascular accident) ([place]) 12/15/2016  . Septic shock ([place]) 11/19/2016  . Pressure injury of skin 10/03/2016  . Sepsis ([place]) 10/02/2016    Past Surgical History:  Procedure Laterality Date  . BACK SURGERY    . CARDIAC SURGERY    . CARDIAC VALVE REPLACEMENT    . CARDIOVERSION N/A 05/04/2017   Procedure: CARDIOVERSION;  Surgeon: Corey Skains, MD;  Location: [facility] ORS;  Service: Cardiovascular;  Laterality: N/A;  . carpel tunnel release    . CATARACT EXTRACTION W/ INTRAOCULAR LENS IMPLANT Bilateral   . COLONOSCOPY    .  COLONOSCOPY WITH ESOPHAGOGASTRODUODENOSCOPY (EGD)    . COLONOSCOPY WITH PROPOFOL N/A 05/16/2018   Procedure: COLONOSCOPY WITH PROPOFOL;  Surgeon: Manya Silvas, MD;  Location: [facility] ENDOSCOPY;  Service: Endoscopy;  Laterality: N/A;  . ESOPHAGOGASTRODUODENOSCOPY (EGD) WITH PROPOFOL N/A 05/16/2018   Procedure: ESOPHAGOGASTRODUODENOSCOPY (EGD) WITH PROPOFOL;  Surgeon: Manya Silvas, MD;  Location: [facility] ENDOSCOPY;  Service: Endoscopy;  Laterality: N/A;  . JOINT REPLACEMENT    . TEE WITHOUT CARDIOVERSION N/A 10/08/2016   Procedure: TRANSESOPHAGEAL ECHOCARDIOGRAM (TEE);  Surgeon: Teodoro Spray, MD;  Location: [facility] ORS;  Service: Cardiovascular;  Laterality: N/A;  . TEE WITHOUT CARDIOVERSION N/A 11/23/2016   Procedure: TRANSESOPHAGEAL ECHOCARDIOGRAM (TEE);  Surgeon: Isaias Cowman, MD;  Location: [facility] ORS;  Service: Cardiovascular;  Laterality: N/A;  . TONSILLECTOMY    . VSD REPAIR      Prior to Admission medications   Medication Sig Start Date End Date Taking? Authorizing Provider  amiodarone (PACERONE) 200 MG tablet Take 1 tablet (200 mg total) by mouth daily. 10/09/16  Yes Fritzi Mandes, MD  aspirin EC 81 MG tablet Take 81 mg by mouth daily.   Yes [provider]  cyclobenzaprine (FLEXERIL) 10 MG tablet Take 10 mg by mouth daily as needed for muscle spasms.    Yes [provider]  DULoxetine (CYMBALTA) 30 MG capsule Take 30 mg by mouth every evening.   Yes [provider]  ferrous sulfate 325 (65 FE) MG tablet Take 1 tablet (325 mg total) by mouth 2 (two) times daily with a meal. 11/03/17  Yes Gladstone Lighter, MD  furosemide (LASIX) 40 MG tablet Take 40 mg by mouth daily.  03/19/16  Yes [provider]  gabapentin (NEURONTIN) 600 MG tablet Take 600 mg by mouth 2 (two) times daily.   Yes [provider]  loratadine (CLARITIN) 10 MG tablet Take 10 mg by mouth daily.   Yes [provider]  metFORMIN (GLUCOPHAGE) 1000 MG tablet Take 1,000  mg by mouth 2 (two) times daily. With meals. 03/19/16  Yes [provider]  methadone (DOLOPHINE) 5 MG tablet Take 5 mg by mouth 2 (two) times daily.  09/02/16  Yes [provider]  metoprolol tartrate (LOPRESSOR) 25 MG tablet Take 25 mg by mouth 2 (two) times daily.  07/20/16  Yes [provider]  Multiple Vitamin (MULTI-VITAMINS) TABS Take 1 tablet by mouth daily.   Yes [provider]  pantoprazole (PROTONIX) 40 MG tablet Take 40 mg by mouth daily.   Yes [provider]  pioglitazone (ACTOS) 30 MG tablet Take 30 mg by mouth daily. 11/10/18 11/10/19 Yes [provider]  pravastatin (PRAVACHOL) 40 MG tablet Take 40 mg by mouth every evening.  02/10/16  Yes [provider]  warfarin (COUMADIN) 5 MG tablet Take 5 mg by mouth daily.   Yes [provider]  oxyCODONE-acetaminophen (PERCOCET) 5-325 MG tablet Take 1 tablet by mouth every 4 (four) hours as needed for severe pain. 12/28/18 12/28/19  Lavonia Drafts, MD     Allergies Demeclocycline and Tetracyclines & related  Family History  Problem Relation Age of Onset  . Stroke Mother   . Heart attack Mother   . Heart attack Father     Social History Social History   Tobacco Use  . Smoking status: Former Research scientist (life sciences)  . Smokeless tobacco: Never Used  Substance Use Topics  . Alcohol use: Yes    Alcohol/week: 1.0 standard drinks    Types: 1 Shots of liquor per week  . Drug use: No    Review of Systems  Constitutional: No fever/chills Eyes: No visual changes.  ENT: No sore throat. Cardiovascular: Denies chest pain. Respiratory: Denies shortness of breath. Gastrointestinal: As above Genitourinary: Negative for dysuria. Musculoskeletal: Negative for back pain. Skin: Negative for rash. Neurological: Negative for headaches or weakness   ____________________________________________   PHYSICAL EXAM:  VITAL SIGNS: ED Triage Vitals  Enc Vitals Group     BP 12/28/18 1225  (!) 163/67     Pulse Rate 12/28/18 1225 60     Resp 12/28/18 1225 16     Temp 12/28/18 1225 98.3 F (36.8 C)     Temp Source 12/28/18 1225 Oral     SpO2 12/28/18 1225 99 %     Weight 12/28/18 1226 125.6 kg (277 lb)     Height 12/28/18 1226 1.778 m (5\' 10" )     Head Circumference --      Peak Flow --      Pain Score 12/28/18 1225 9     Pain Loc --      Pain Edu? --      Excl. in [place]? --    Constitutional: Alert and oriented. No acute distress. Eyes: Conjunctivae are normal.  Nose: No congestion/rhinnorhea. Mouth/Throat: Mucous membranes are moist.    Cardiovascular: Normal rate, regular rhythm. Grossly normal heart sounds.  Good peripheral circulation. Respiratory: Normal respiratory effort.  No retractions. Lungs CTAB. Gastrointestinal: Soft and nontender. No distention.  No CVA tenderness.  Overall reassuring exam  Musculoskeletal: .  Warm and well perfused Neurologic:  Normal speech and language. No gross focal neurologic deficits are appreciated.  Skin:  Skin is warm, dry and intact. No rash noted. Psychiatric: Mood and affect are normal. Speech and behavior are normal.  ____________________________________________   LABS (all labs ordered are listed, but only abnormal results are displayed)  Labs Reviewed  COMPREHENSIVE METABOLIC PANEL - Abnormal; Notable for the following components:      Result Value   Glucose, Bld 134 (*)    BUN 28 (*)    AST 48 (*)    All other components within normal limits  CBC - Abnormal; Notable for the following components:   Hemoglobin 12.9 (*)    All other components within normal limits  LIPASE, BLOOD   ____________________________________________  EKG  ED ECG REPORT I, Lavonia Drafts, the attending physician, personally viewed and interpreted this ECG.  Date: 01/09/2019  Rhythm: Sinus bradycardia QRS Axis: normal Intervals: First-degree AV block ST/T Wave abnormalities: normal Narrative Interpretation: no evidence of acute  ischemia  ____________________________________________  RADIOLOGY  CT abdomen pelvis ____________________________________________   PROCEDURES  Procedure(s) performed: No  Procedures   Critical Care performed: No ____________________________________________   INITIAL IMPRESSION / ASSESSMENT AND PLAN / ED COURSE  Pertinent labs & imaging results that were available during my care of the patient were reviewed by me and considered in my medical decision making (see chart for details).  Patient presents with right-sided abdominal pain as described above.  Mild nausea no vomiting.  No fevers or chills.  Differential includes appendicitis, apparently had normal ultrasound ruling out cholecystitis.  Diverticulitis/colitis also possibility.  Exam is overall reassuring, lab work is unremarkable.  Patient treated with IV morphine and IV Zofran, pending CT scan  CT scan unremarkable.,  Discussed incidental findings with the patient.  He is feeling better after treatment, offered admission but he is comfortable with discharge, states he will return if his pain worsens.    ____________________________________________   FINAL CLINICAL IMPRESSION(S) / ED DIAGNOSES  Final diagnoses:  Right upper quadrant abdominal pain        Note:  This document was prepared using Dragon voice recognition software and may include unintentional dictation errors.   Lavonia Drafts, MD 12/30/18 1000    Lavonia Drafts, MD 01/09/19 [phone]

## 2018-12-28 NOTE — ED Notes (Signed)
Called CT to report pt finished with contrast.

## 2018-12-29 ENCOUNTER — Other Ambulatory Visit: Payer: Self-pay | Admitting: Internal Medicine

## 2018-12-29 ENCOUNTER — Other Ambulatory Visit: Payer: Self-pay | Admitting: Nurse Practitioner

## 2018-12-29 DIAGNOSIS — R112 Nausea with vomiting, unspecified: Secondary | ICD-10-CM | POA: Diagnosis not present

## 2018-12-29 DIAGNOSIS — K760 Fatty (change of) liver, not elsewhere classified: Secondary | ICD-10-CM | POA: Diagnosis not present

## 2018-12-29 DIAGNOSIS — R634 Abnormal weight loss: Secondary | ICD-10-CM | POA: Diagnosis not present

## 2018-12-29 DIAGNOSIS — R1084 Generalized abdominal pain: Secondary | ICD-10-CM

## 2018-12-29 DIAGNOSIS — R748 Abnormal levels of other serum enzymes: Secondary | ICD-10-CM | POA: Diagnosis not present

## 2018-12-29 DIAGNOSIS — D509 Iron deficiency anemia, unspecified: Secondary | ICD-10-CM | POA: Diagnosis not present

## 2018-12-29 DIAGNOSIS — R1011 Right upper quadrant pain: Secondary | ICD-10-CM | POA: Diagnosis not present

## 2018-12-30 DIAGNOSIS — R112 Nausea with vomiting, unspecified: Secondary | ICD-10-CM | POA: Insufficient documentation

## 2018-12-30 DIAGNOSIS — R634 Abnormal weight loss: Secondary | ICD-10-CM | POA: Insufficient documentation

## 2018-12-30 DIAGNOSIS — R748 Abnormal levels of other serum enzymes: Secondary | ICD-10-CM | POA: Insufficient documentation

## 2019-01-07 ENCOUNTER — Other Ambulatory Visit: Payer: Self-pay

## 2019-01-07 ENCOUNTER — Ambulatory Visit
Admission: RE | Admit: 2019-01-07 | Discharge: 2019-01-07 | Disposition: A | Discharge status: Home / Self Care | Payer: PPO | Source: Ambulatory Visit | Attending: Nurse Practitioner | Admitting: Nurse Practitioner

## 2019-01-07 DIAGNOSIS — R112 Nausea with vomiting, unspecified: Secondary | ICD-10-CM | POA: Diagnosis not present

## 2019-01-07 DIAGNOSIS — R1011 Right upper quadrant pain: Secondary | ICD-10-CM | POA: Diagnosis not present

## 2019-01-07 MED ORDER — TECHNETIUM TC 99M MEBROFENIN IV KIT
5.3000 | PACK | Freq: Once | INTRAVENOUS | Status: AC | PRN
  Administered 2019-01-07: 5.3 via INTRAVENOUS

## 2019-01-10 DIAGNOSIS — R748 Abnormal levels of other serum enzymes: Secondary | ICD-10-CM | POA: Diagnosis not present

## 2019-01-10 DIAGNOSIS — L97221 Non-pressure chronic ulcer of left calf limited to breakdown of skin: Secondary | ICD-10-CM | POA: Diagnosis not present

## 2019-01-10 DIAGNOSIS — K760 Fatty (change of) liver, not elsewhere classified: Secondary | ICD-10-CM | POA: Diagnosis not present

## 2019-01-10 DIAGNOSIS — L97421 Non-pressure chronic ulcer of left heel and midfoot limited to breakdown of skin: Secondary | ICD-10-CM | POA: Diagnosis not present

## 2019-01-10 DIAGNOSIS — D509 Iron deficiency anemia, unspecified: Secondary | ICD-10-CM | POA: Diagnosis not present

## 2019-01-10 DIAGNOSIS — I872 Venous insufficiency (chronic) (peripheral): Secondary | ICD-10-CM | POA: Diagnosis not present

## 2019-01-10 DIAGNOSIS — E1140 Type 2 diabetes mellitus with diabetic neuropathy, unspecified: Secondary | ICD-10-CM | POA: Diagnosis not present

## 2019-01-10 DIAGNOSIS — I350 Nonrheumatic aortic (valve) stenosis: Secondary | ICD-10-CM | POA: Diagnosis not present

## 2019-01-10 DIAGNOSIS — R634 Abnormal weight loss: Secondary | ICD-10-CM | POA: Diagnosis not present

## 2019-01-11 ENCOUNTER — Ambulatory Visit: Payer: PPO

## 2019-01-12 DIAGNOSIS — L89620 Pressure ulcer of left heel, unstageable: Secondary | ICD-10-CM | POA: Diagnosis not present

## 2019-01-12 DIAGNOSIS — R0989 Other specified symptoms and signs involving the circulatory and respiratory systems: Secondary | ICD-10-CM | POA: Diagnosis not present

## 2019-01-12 DIAGNOSIS — K760 Fatty (change of) liver, not elsewhere classified: Secondary | ICD-10-CM | POA: Diagnosis not present

## 2019-01-12 DIAGNOSIS — R1031 Right lower quadrant pain: Secondary | ICD-10-CM | POA: Diagnosis not present

## 2019-01-12 DIAGNOSIS — R1012 Left upper quadrant pain: Secondary | ICD-10-CM | POA: Diagnosis not present

## 2019-01-13 ENCOUNTER — Ambulatory Visit: Payer: PPO

## 2019-01-25 DIAGNOSIS — E782 Mixed hyperlipidemia: Secondary | ICD-10-CM | POA: Diagnosis not present

## 2019-01-25 DIAGNOSIS — G894 Chronic pain syndrome: Secondary | ICD-10-CM | POA: Diagnosis not present

## 2019-01-25 DIAGNOSIS — Z79899 Other long term (current) drug therapy: Secondary | ICD-10-CM | POA: Diagnosis not present

## 2019-01-25 DIAGNOSIS — E1151 Type 2 diabetes mellitus with diabetic peripheral angiopathy without gangrene: Secondary | ICD-10-CM | POA: Diagnosis not present

## 2019-01-25 DIAGNOSIS — I10 Essential (primary) hypertension: Secondary | ICD-10-CM | POA: Diagnosis not present

## 2019-02-01 DIAGNOSIS — M5136 Other intervertebral disc degeneration, lumbar region: Secondary | ICD-10-CM | POA: Diagnosis not present

## 2019-02-01 DIAGNOSIS — G4733 Obstructive sleep apnea (adult) (pediatric): Secondary | ICD-10-CM | POA: Diagnosis not present

## 2019-02-01 DIAGNOSIS — C3490 Malignant neoplasm of unspecified part of unspecified bronchus or lung: Secondary | ICD-10-CM | POA: Diagnosis not present

## 2019-02-01 DIAGNOSIS — G894 Chronic pain syndrome: Secondary | ICD-10-CM | POA: Diagnosis not present

## 2019-02-01 DIAGNOSIS — Z79891 Long term (current) use of opiate analgesic: Secondary | ICD-10-CM | POA: Diagnosis not present

## 2019-02-01 DIAGNOSIS — M545 Low back pain: Secondary | ICD-10-CM | POA: Diagnosis not present

## 2019-02-01 DIAGNOSIS — Z79899 Other long term (current) drug therapy: Secondary | ICD-10-CM | POA: Diagnosis not present

## 2019-02-02 DIAGNOSIS — R1011 Right upper quadrant pain: Secondary | ICD-10-CM | POA: Diagnosis not present

## 2019-02-07 DIAGNOSIS — L97421 Non-pressure chronic ulcer of left heel and midfoot limited to breakdown of skin: Secondary | ICD-10-CM | POA: Diagnosis not present

## 2019-02-07 DIAGNOSIS — L97221 Non-pressure chronic ulcer of left calf limited to breakdown of skin: Secondary | ICD-10-CM | POA: Diagnosis not present

## 2019-02-07 DIAGNOSIS — L03116 Cellulitis of left lower limb: Secondary | ICD-10-CM | POA: Diagnosis not present

## 2019-02-07 DIAGNOSIS — I872 Venous insufficiency (chronic) (peripheral): Secondary | ICD-10-CM | POA: Diagnosis not present

## 2019-02-07 DIAGNOSIS — E1140 Type 2 diabetes mellitus with diabetic neuropathy, unspecified: Secondary | ICD-10-CM | POA: Diagnosis not present

## 2019-02-13 DIAGNOSIS — I350 Nonrheumatic aortic (valve) stenosis: Secondary | ICD-10-CM | POA: Diagnosis not present

## 2019-03-07 DIAGNOSIS — L97421 Non-pressure chronic ulcer of left heel and midfoot limited to breakdown of skin: Secondary | ICD-10-CM | POA: Diagnosis not present

## 2019-03-07 DIAGNOSIS — I878 Other specified disorders of veins: Secondary | ICD-10-CM | POA: Diagnosis not present

## 2019-03-07 DIAGNOSIS — E1140 Type 2 diabetes mellitus with diabetic neuropathy, unspecified: Secondary | ICD-10-CM | POA: Diagnosis not present

## 2019-03-07 DIAGNOSIS — L03116 Cellulitis of left lower limb: Secondary | ICD-10-CM | POA: Diagnosis not present

## 2019-03-15 DIAGNOSIS — I350 Nonrheumatic aortic (valve) stenosis: Secondary | ICD-10-CM | POA: Diagnosis not present

## 2019-03-15 DIAGNOSIS — G894 Chronic pain syndrome: Secondary | ICD-10-CM | POA: Diagnosis not present

## 2019-03-15 DIAGNOSIS — M545 Low back pain: Secondary | ICD-10-CM | POA: Diagnosis not present

## 2019-03-15 DIAGNOSIS — M5136 Other intervertebral disc degeneration, lumbar region: Secondary | ICD-10-CM | POA: Diagnosis not present

## 2019-03-28 DIAGNOSIS — L97421 Non-pressure chronic ulcer of left heel and midfoot limited to breakdown of skin: Secondary | ICD-10-CM | POA: Diagnosis not present

## 2019-03-28 DIAGNOSIS — E1140 Type 2 diabetes mellitus with diabetic neuropathy, unspecified: Secondary | ICD-10-CM | POA: Diagnosis not present

## 2019-03-28 DIAGNOSIS — I480 Paroxysmal atrial fibrillation: Secondary | ICD-10-CM | POA: Diagnosis not present

## 2019-03-28 DIAGNOSIS — I6310 Cerebral infarction due to embolism of unspecified precerebral artery: Secondary | ICD-10-CM | POA: Diagnosis not present

## 2019-03-28 DIAGNOSIS — I2510 Atherosclerotic heart disease of native coronary artery without angina pectoris: Secondary | ICD-10-CM | POA: Diagnosis not present

## 2019-03-28 DIAGNOSIS — I350 Nonrheumatic aortic (valve) stenosis: Secondary | ICD-10-CM | POA: Diagnosis not present

## 2019-03-28 DIAGNOSIS — E782 Mixed hyperlipidemia: Secondary | ICD-10-CM | POA: Diagnosis not present

## 2019-03-28 DIAGNOSIS — I10 Essential (primary) hypertension: Secondary | ICD-10-CM | POA: Diagnosis not present

## 2019-04-11 DIAGNOSIS — H1131 Conjunctival hemorrhage, right eye: Secondary | ICD-10-CM | POA: Diagnosis not present

## 2019-04-17 DIAGNOSIS — H6123 Impacted cerumen, bilateral: Secondary | ICD-10-CM | POA: Diagnosis not present

## 2019-04-17 DIAGNOSIS — I350 Nonrheumatic aortic (valve) stenosis: Secondary | ICD-10-CM | POA: Diagnosis not present

## 2019-04-17 DIAGNOSIS — H903 Sensorineural hearing loss, bilateral: Secondary | ICD-10-CM | POA: Diagnosis not present

## 2019-04-20 DIAGNOSIS — E119 Type 2 diabetes mellitus without complications: Secondary | ICD-10-CM | POA: Diagnosis not present

## 2019-04-24 DIAGNOSIS — I10 Essential (primary) hypertension: Secondary | ICD-10-CM | POA: Diagnosis not present

## 2019-04-24 DIAGNOSIS — E1151 Type 2 diabetes mellitus with diabetic peripheral angiopathy without gangrene: Secondary | ICD-10-CM | POA: Diagnosis not present

## 2019-04-24 DIAGNOSIS — E782 Mixed hyperlipidemia: Secondary | ICD-10-CM | POA: Diagnosis not present

## 2019-04-24 DIAGNOSIS — Z79899 Other long term (current) drug therapy: Secondary | ICD-10-CM | POA: Diagnosis not present

## 2019-04-26 DIAGNOSIS — G894 Chronic pain syndrome: Secondary | ICD-10-CM | POA: Diagnosis not present

## 2019-04-26 DIAGNOSIS — Z79891 Long term (current) use of opiate analgesic: Secondary | ICD-10-CM | POA: Diagnosis not present

## 2019-04-26 DIAGNOSIS — M5136 Other intervertebral disc degeneration, lumbar region: Secondary | ICD-10-CM | POA: Diagnosis not present

## 2019-04-26 DIAGNOSIS — Z79899 Other long term (current) drug therapy: Secondary | ICD-10-CM | POA: Diagnosis not present

## 2019-04-26 DIAGNOSIS — M545 Low back pain: Secondary | ICD-10-CM | POA: Diagnosis not present

## 2019-05-09 DIAGNOSIS — E1140 Type 2 diabetes mellitus with diabetic neuropathy, unspecified: Secondary | ICD-10-CM | POA: Diagnosis not present

## 2019-05-09 DIAGNOSIS — L03116 Cellulitis of left lower limb: Secondary | ICD-10-CM | POA: Diagnosis not present

## 2019-05-09 DIAGNOSIS — L97421 Non-pressure chronic ulcer of left heel and midfoot limited to breakdown of skin: Secondary | ICD-10-CM | POA: Diagnosis not present

## 2019-05-09 DIAGNOSIS — T148XXA Other injury of unspecified body region, initial encounter: Secondary | ICD-10-CM | POA: Diagnosis not present

## 2019-05-09 DIAGNOSIS — I878 Other specified disorders of veins: Secondary | ICD-10-CM | POA: Diagnosis not present

## 2019-05-10 DIAGNOSIS — L989 Disorder of the skin and subcutaneous tissue, unspecified: Secondary | ICD-10-CM | POA: Diagnosis not present

## 2019-05-10 DIAGNOSIS — G894 Chronic pain syndrome: Secondary | ICD-10-CM | POA: Diagnosis not present

## 2019-05-10 DIAGNOSIS — D649 Anemia, unspecified: Secondary | ICD-10-CM | POA: Diagnosis not present

## 2019-05-10 DIAGNOSIS — I480 Paroxysmal atrial fibrillation: Secondary | ICD-10-CM | POA: Diagnosis not present

## 2019-05-10 DIAGNOSIS — I10 Essential (primary) hypertension: Secondary | ICD-10-CM | POA: Diagnosis not present

## 2019-05-10 DIAGNOSIS — I2510 Atherosclerotic heart disease of native coronary artery without angina pectoris: Secondary | ICD-10-CM | POA: Diagnosis not present

## 2019-05-10 DIAGNOSIS — Z125 Encounter for screening for malignant neoplasm of prostate: Secondary | ICD-10-CM | POA: Diagnosis not present

## 2019-05-10 DIAGNOSIS — E1151 Type 2 diabetes mellitus with diabetic peripheral angiopathy without gangrene: Secondary | ICD-10-CM | POA: Diagnosis not present

## 2019-05-10 DIAGNOSIS — Z79899 Other long term (current) drug therapy: Secondary | ICD-10-CM | POA: Diagnosis not present

## 2019-05-10 DIAGNOSIS — E782 Mixed hyperlipidemia: Secondary | ICD-10-CM | POA: Diagnosis not present

## 2019-05-17 DIAGNOSIS — I350 Nonrheumatic aortic (valve) stenosis: Secondary | ICD-10-CM | POA: Diagnosis not present

## 2019-05-23 DIAGNOSIS — E1140 Type 2 diabetes mellitus with diabetic neuropathy, unspecified: Secondary | ICD-10-CM | POA: Diagnosis not present

## 2019-05-23 DIAGNOSIS — L97421 Non-pressure chronic ulcer of left heel and midfoot limited to breakdown of skin: Secondary | ICD-10-CM | POA: Diagnosis not present

## 2019-06-12 DIAGNOSIS — M5136 Other intervertebral disc degeneration, lumbar region: Secondary | ICD-10-CM | POA: Diagnosis not present

## 2019-06-12 DIAGNOSIS — G894 Chronic pain syndrome: Secondary | ICD-10-CM | POA: Diagnosis not present

## 2019-06-12 DIAGNOSIS — M545 Low back pain: Secondary | ICD-10-CM | POA: Diagnosis not present

## 2019-06-15 DIAGNOSIS — I350 Nonrheumatic aortic (valve) stenosis: Secondary | ICD-10-CM | POA: Diagnosis not present

## 2019-06-27 DIAGNOSIS — L97421 Non-pressure chronic ulcer of left heel and midfoot limited to breakdown of skin: Secondary | ICD-10-CM | POA: Diagnosis not present

## 2019-06-27 DIAGNOSIS — E1140 Type 2 diabetes mellitus with diabetic neuropathy, unspecified: Secondary | ICD-10-CM | POA: Diagnosis not present

## 2019-06-27 DIAGNOSIS — L03116 Cellulitis of left lower limb: Secondary | ICD-10-CM | POA: Diagnosis not present

## 2019-06-27 DIAGNOSIS — I878 Other specified disorders of veins: Secondary | ICD-10-CM | POA: Diagnosis not present

## 2019-07-25 DIAGNOSIS — E1140 Type 2 diabetes mellitus with diabetic neuropathy, unspecified: Secondary | ICD-10-CM | POA: Diagnosis not present

## 2019-07-25 DIAGNOSIS — S90112A Contusion of left great toe without damage to nail, initial encounter: Secondary | ICD-10-CM | POA: Diagnosis not present

## 2019-07-26 DIAGNOSIS — Z125 Encounter for screening for malignant neoplasm of prostate: Secondary | ICD-10-CM | POA: Diagnosis not present

## 2019-07-26 DIAGNOSIS — Z79899 Other long term (current) drug therapy: Secondary | ICD-10-CM | POA: Diagnosis not present

## 2019-07-26 DIAGNOSIS — D649 Anemia, unspecified: Secondary | ICD-10-CM | POA: Diagnosis not present

## 2019-07-26 DIAGNOSIS — I10 Essential (primary) hypertension: Secondary | ICD-10-CM | POA: Diagnosis not present

## 2019-07-26 DIAGNOSIS — I350 Nonrheumatic aortic (valve) stenosis: Secondary | ICD-10-CM | POA: Diagnosis not present

## 2019-07-26 DIAGNOSIS — E1151 Type 2 diabetes mellitus with diabetic peripheral angiopathy without gangrene: Secondary | ICD-10-CM | POA: Diagnosis not present

## 2019-07-31 DIAGNOSIS — M545 Low back pain: Secondary | ICD-10-CM | POA: Diagnosis not present

## 2019-07-31 DIAGNOSIS — Z6835 Body mass index (BMI) 35.0-35.9, adult: Secondary | ICD-10-CM | POA: Diagnosis not present

## 2019-07-31 DIAGNOSIS — M5136 Other intervertebral disc degeneration, lumbar region: Secondary | ICD-10-CM | POA: Diagnosis not present

## 2019-07-31 DIAGNOSIS — G894 Chronic pain syndrome: Secondary | ICD-10-CM | POA: Diagnosis not present

## 2019-08-07 DIAGNOSIS — I2510 Atherosclerotic heart disease of native coronary artery without angina pectoris: Secondary | ICD-10-CM | POA: Diagnosis not present

## 2019-08-07 DIAGNOSIS — Z79899 Other long term (current) drug therapy: Secondary | ICD-10-CM | POA: Diagnosis not present

## 2019-08-07 DIAGNOSIS — Z23 Encounter for immunization: Secondary | ICD-10-CM | POA: Diagnosis not present

## 2019-08-07 DIAGNOSIS — E1151 Type 2 diabetes mellitus with diabetic peripheral angiopathy without gangrene: Secondary | ICD-10-CM | POA: Diagnosis not present

## 2019-08-07 DIAGNOSIS — E538 Deficiency of other specified B group vitamins: Secondary | ICD-10-CM | POA: Diagnosis not present

## 2019-08-07 DIAGNOSIS — E782 Mixed hyperlipidemia: Secondary | ICD-10-CM | POA: Diagnosis not present

## 2019-08-07 DIAGNOSIS — I350 Nonrheumatic aortic (valve) stenosis: Secondary | ICD-10-CM | POA: Diagnosis not present

## 2019-08-07 DIAGNOSIS — G894 Chronic pain syndrome: Secondary | ICD-10-CM | POA: Diagnosis not present

## 2019-08-07 DIAGNOSIS — I10 Essential (primary) hypertension: Secondary | ICD-10-CM | POA: Diagnosis not present

## 2019-08-07 DIAGNOSIS — Z0000 Encounter for general adult medical examination without abnormal findings: Secondary | ICD-10-CM | POA: Diagnosis not present

## 2019-08-08 DIAGNOSIS — E1140 Type 2 diabetes mellitus with diabetic neuropathy, unspecified: Secondary | ICD-10-CM | POA: Diagnosis not present

## 2019-08-08 DIAGNOSIS — S90112A Contusion of left great toe without damage to nail, initial encounter: Secondary | ICD-10-CM | POA: Diagnosis not present

## 2019-08-08 DIAGNOSIS — L97421 Non-pressure chronic ulcer of left heel and midfoot limited to breakdown of skin: Secondary | ICD-10-CM | POA: Diagnosis not present

## 2019-08-29 DIAGNOSIS — I350 Nonrheumatic aortic (valve) stenosis: Secondary | ICD-10-CM | POA: Diagnosis not present

## 2019-09-11 DIAGNOSIS — M5136 Other intervertebral disc degeneration, lumbar region: Secondary | ICD-10-CM | POA: Diagnosis not present

## 2019-09-11 DIAGNOSIS — Z6835 Body mass index (BMI) 35.0-35.9, adult: Secondary | ICD-10-CM | POA: Diagnosis not present

## 2019-09-11 DIAGNOSIS — M545 Low back pain: Secondary | ICD-10-CM | POA: Diagnosis not present

## 2019-09-11 DIAGNOSIS — G894 Chronic pain syndrome: Secondary | ICD-10-CM | POA: Diagnosis not present

## 2019-09-18 DIAGNOSIS — L97421 Non-pressure chronic ulcer of left heel and midfoot limited to breakdown of skin: Secondary | ICD-10-CM | POA: Diagnosis not present

## 2019-09-18 DIAGNOSIS — L03116 Cellulitis of left lower limb: Secondary | ICD-10-CM | POA: Diagnosis not present

## 2019-09-18 DIAGNOSIS — E1140 Type 2 diabetes mellitus with diabetic neuropathy, unspecified: Secondary | ICD-10-CM | POA: Diagnosis not present

## 2019-09-18 DIAGNOSIS — S90822A Blister (nonthermal), left foot, initial encounter: Secondary | ICD-10-CM | POA: Diagnosis not present

## 2019-09-21 DIAGNOSIS — L97422 Non-pressure chronic ulcer of left heel and midfoot with fat layer exposed: Secondary | ICD-10-CM | POA: Diagnosis not present

## 2019-09-26 DIAGNOSIS — I350 Nonrheumatic aortic (valve) stenosis: Secondary | ICD-10-CM | POA: Diagnosis not present

## 2019-09-28 DIAGNOSIS — E1140 Type 2 diabetes mellitus with diabetic neuropathy, unspecified: Secondary | ICD-10-CM | POA: Diagnosis not present

## 2019-09-28 DIAGNOSIS — L97422 Non-pressure chronic ulcer of left heel and midfoot with fat layer exposed: Secondary | ICD-10-CM | POA: Diagnosis not present

## 2019-09-28 DIAGNOSIS — L03116 Cellulitis of left lower limb: Secondary | ICD-10-CM | POA: Diagnosis not present

## 2019-10-02 DIAGNOSIS — I6523 Occlusion and stenosis of bilateral carotid arteries: Secondary | ICD-10-CM | POA: Diagnosis not present

## 2019-10-02 DIAGNOSIS — I2510 Atherosclerotic heart disease of native coronary artery without angina pectoris: Secondary | ICD-10-CM | POA: Diagnosis not present

## 2019-10-02 DIAGNOSIS — E782 Mixed hyperlipidemia: Secondary | ICD-10-CM | POA: Diagnosis not present

## 2019-10-02 DIAGNOSIS — I358 Other nonrheumatic aortic valve disorders: Secondary | ICD-10-CM | POA: Diagnosis not present

## 2019-10-02 DIAGNOSIS — I10 Essential (primary) hypertension: Secondary | ICD-10-CM | POA: Diagnosis not present

## 2019-10-11 DIAGNOSIS — L97422 Non-pressure chronic ulcer of left heel and midfoot with fat layer exposed: Secondary | ICD-10-CM | POA: Diagnosis not present

## 2019-10-11 DIAGNOSIS — E1140 Type 2 diabetes mellitus with diabetic neuropathy, unspecified: Secondary | ICD-10-CM | POA: Diagnosis not present

## 2019-10-25 DIAGNOSIS — E11621 Type 2 diabetes mellitus with foot ulcer: Secondary | ICD-10-CM | POA: Diagnosis not present

## 2019-10-25 DIAGNOSIS — I350 Nonrheumatic aortic (valve) stenosis: Secondary | ICD-10-CM | POA: Diagnosis not present

## 2019-10-25 DIAGNOSIS — L97422 Non-pressure chronic ulcer of left heel and midfoot with fat layer exposed: Secondary | ICD-10-CM | POA: Diagnosis not present

## 2019-10-27 DIAGNOSIS — M5136 Other intervertebral disc degeneration, lumbar region: Secondary | ICD-10-CM | POA: Diagnosis not present

## 2019-10-27 DIAGNOSIS — M545 Low back pain: Secondary | ICD-10-CM | POA: Diagnosis not present

## 2019-10-27 DIAGNOSIS — G894 Chronic pain syndrome: Secondary | ICD-10-CM | POA: Diagnosis not present

## 2019-10-27 DIAGNOSIS — Z79891 Long term (current) use of opiate analgesic: Secondary | ICD-10-CM | POA: Diagnosis not present

## 2019-10-27 DIAGNOSIS — Z79899 Other long term (current) drug therapy: Secondary | ICD-10-CM | POA: Diagnosis not present

## 2019-10-30 DIAGNOSIS — I2510 Atherosclerotic heart disease of native coronary artery without angina pectoris: Secondary | ICD-10-CM | POA: Diagnosis not present

## 2019-10-30 DIAGNOSIS — I358 Other nonrheumatic aortic valve disorders: Secondary | ICD-10-CM | POA: Diagnosis not present

## 2019-10-31 DIAGNOSIS — E782 Mixed hyperlipidemia: Secondary | ICD-10-CM | POA: Diagnosis not present

## 2019-10-31 DIAGNOSIS — Z79899 Other long term (current) drug therapy: Secondary | ICD-10-CM | POA: Diagnosis not present

## 2019-10-31 DIAGNOSIS — E538 Deficiency of other specified B group vitamins: Secondary | ICD-10-CM | POA: Diagnosis not present

## 2019-10-31 DIAGNOSIS — I10 Essential (primary) hypertension: Secondary | ICD-10-CM | POA: Diagnosis not present

## 2019-10-31 DIAGNOSIS — E1151 Type 2 diabetes mellitus with diabetic peripheral angiopathy without gangrene: Secondary | ICD-10-CM | POA: Diagnosis not present

## 2019-11-02 DIAGNOSIS — I480 Paroxysmal atrial fibrillation: Secondary | ICD-10-CM | POA: Diagnosis not present

## 2019-11-02 DIAGNOSIS — I10 Essential (primary) hypertension: Secondary | ICD-10-CM | POA: Diagnosis not present

## 2019-11-02 DIAGNOSIS — I2510 Atherosclerotic heart disease of native coronary artery without angina pectoris: Secondary | ICD-10-CM | POA: Diagnosis not present

## 2019-11-02 DIAGNOSIS — I6523 Occlusion and stenosis of bilateral carotid arteries: Secondary | ICD-10-CM | POA: Diagnosis not present

## 2019-11-02 DIAGNOSIS — E782 Mixed hyperlipidemia: Secondary | ICD-10-CM | POA: Diagnosis not present

## 2019-11-02 DIAGNOSIS — I350 Nonrheumatic aortic (valve) stenosis: Secondary | ICD-10-CM | POA: Diagnosis not present

## 2019-11-07 DIAGNOSIS — E538 Deficiency of other specified B group vitamins: Secondary | ICD-10-CM | POA: Diagnosis not present

## 2019-11-07 DIAGNOSIS — Z79899 Other long term (current) drug therapy: Secondary | ICD-10-CM | POA: Diagnosis not present

## 2019-11-07 DIAGNOSIS — E1140 Type 2 diabetes mellitus with diabetic neuropathy, unspecified: Secondary | ICD-10-CM | POA: Diagnosis not present

## 2019-11-07 DIAGNOSIS — L97509 Non-pressure chronic ulcer of other part of unspecified foot with unspecified severity: Secondary | ICD-10-CM | POA: Diagnosis not present

## 2019-11-07 DIAGNOSIS — Z0000 Encounter for general adult medical examination without abnormal findings: Secondary | ICD-10-CM | POA: Diagnosis not present

## 2019-11-07 DIAGNOSIS — I10 Essential (primary) hypertension: Secondary | ICD-10-CM | POA: Diagnosis not present

## 2019-11-07 DIAGNOSIS — I2510 Atherosclerotic heart disease of native coronary artery without angina pectoris: Secondary | ICD-10-CM | POA: Diagnosis not present

## 2019-11-07 DIAGNOSIS — E11621 Type 2 diabetes mellitus with foot ulcer: Secondary | ICD-10-CM | POA: Diagnosis not present

## 2019-11-07 DIAGNOSIS — G894 Chronic pain syndrome: Secondary | ICD-10-CM | POA: Diagnosis not present

## 2019-11-07 DIAGNOSIS — E782 Mixed hyperlipidemia: Secondary | ICD-10-CM | POA: Diagnosis not present

## 2019-11-08 DIAGNOSIS — E11621 Type 2 diabetes mellitus with foot ulcer: Secondary | ICD-10-CM | POA: Diagnosis not present

## 2019-11-08 DIAGNOSIS — L97422 Non-pressure chronic ulcer of left heel and midfoot with fat layer exposed: Secondary | ICD-10-CM | POA: Diagnosis not present

## 2019-11-15 DIAGNOSIS — L97422 Non-pressure chronic ulcer of left heel and midfoot with fat layer exposed: Secondary | ICD-10-CM | POA: Diagnosis not present

## 2019-11-15 DIAGNOSIS — E11621 Type 2 diabetes mellitus with foot ulcer: Secondary | ICD-10-CM | POA: Diagnosis not present

## 2019-11-20 DIAGNOSIS — M5126 Other intervertebral disc displacement, lumbar region: Secondary | ICD-10-CM | POA: Diagnosis not present

## 2019-11-20 DIAGNOSIS — Z981 Arthrodesis status: Secondary | ICD-10-CM | POA: Diagnosis not present

## 2019-11-20 DIAGNOSIS — M47816 Spondylosis without myelopathy or radiculopathy, lumbar region: Secondary | ICD-10-CM | POA: Diagnosis not present

## 2019-11-22 DIAGNOSIS — E11621 Type 2 diabetes mellitus with foot ulcer: Secondary | ICD-10-CM | POA: Diagnosis not present

## 2019-11-22 DIAGNOSIS — L97422 Non-pressure chronic ulcer of left heel and midfoot with fat layer exposed: Secondary | ICD-10-CM | POA: Diagnosis not present

## 2019-11-29 DIAGNOSIS — I350 Nonrheumatic aortic (valve) stenosis: Secondary | ICD-10-CM | POA: Diagnosis not present

## 2019-11-30 DIAGNOSIS — E11621 Type 2 diabetes mellitus with foot ulcer: Secondary | ICD-10-CM | POA: Diagnosis not present

## 2019-11-30 DIAGNOSIS — L97422 Non-pressure chronic ulcer of left heel and midfoot with fat layer exposed: Secondary | ICD-10-CM | POA: Diagnosis not present

## 2019-12-06 DIAGNOSIS — L97422 Non-pressure chronic ulcer of left heel and midfoot with fat layer exposed: Secondary | ICD-10-CM | POA: Diagnosis not present

## 2019-12-06 DIAGNOSIS — E11621 Type 2 diabetes mellitus with foot ulcer: Secondary | ICD-10-CM | POA: Diagnosis not present

## 2019-12-06 DIAGNOSIS — L97509 Non-pressure chronic ulcer of other part of unspecified foot with unspecified severity: Secondary | ICD-10-CM | POA: Diagnosis not present

## 2019-12-14 DIAGNOSIS — L97422 Non-pressure chronic ulcer of left heel and midfoot with fat layer exposed: Secondary | ICD-10-CM | POA: Diagnosis not present

## 2019-12-14 DIAGNOSIS — E11621 Type 2 diabetes mellitus with foot ulcer: Secondary | ICD-10-CM | POA: Diagnosis not present

## 2019-12-20 DIAGNOSIS — L97422 Non-pressure chronic ulcer of left heel and midfoot with fat layer exposed: Secondary | ICD-10-CM | POA: Diagnosis not present

## 2019-12-20 DIAGNOSIS — E11621 Type 2 diabetes mellitus with foot ulcer: Secondary | ICD-10-CM | POA: Diagnosis not present

## 2019-12-21 DIAGNOSIS — M47816 Spondylosis without myelopathy or radiculopathy, lumbar region: Secondary | ICD-10-CM | POA: Diagnosis not present

## 2019-12-21 DIAGNOSIS — M5136 Other intervertebral disc degeneration, lumbar region: Secondary | ICD-10-CM | POA: Diagnosis not present

## 2019-12-21 DIAGNOSIS — Z6835 Body mass index (BMI) 35.0-35.9, adult: Secondary | ICD-10-CM | POA: Diagnosis not present

## 2019-12-21 DIAGNOSIS — G894 Chronic pain syndrome: Secondary | ICD-10-CM | POA: Diagnosis not present

## 2019-12-25 IMAGING — NM NUCLEAR MEDICINE HEPATOBILIARY IMAGING WITH GALLBLADDER EF
2 series · 12 of 12 positions shown · non-contrast
Comparison: CT 12/28/2018 and ultrasound 12/26/2018

CLINICAL DATA: Right upper quadrant pain 3 weeks with nausea and
vomiting.

EXAM:
NUCLEAR MEDICINE HEPATOBILIARY IMAGING WITH GALLBLADDER EF
TECHNIQUE: Sequential images of the abdomen were obtained [DATE] minutes
following intravenous administration of radiopharmaceutical. After
oral ingestion of Ensure, gallbladder ejection fraction was
determined. At 60 min, normal ejection fraction is greater than 33%.
RADIOPHARMACEUTICALS:  5.3 mCi Dc-88m  Choletec IV

[Series 1000: hepatobiliary scan · 9.59mm/px · 6 of 60 frames shown]
[frame 6/60]
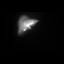
[frame 16/60]
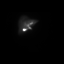
[frame 26/60]
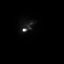
[frame 36/60]
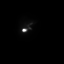
[frame 46/60]
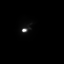
[frame 56/60]
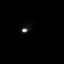

[Series 1000: gallbladder ef · 4.80mm/px · 6 of 120 frames shown]
[frame 11/120]
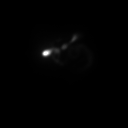
[frame 31/120]
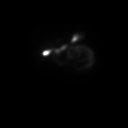
[frame 51/120]
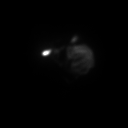
[frame 71/120]
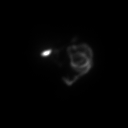
[frame 91/120]
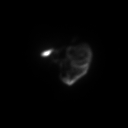
[frame 111/120]
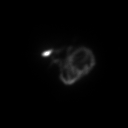

[12 of 12 positions shown; findings below may reference images not displayed]

FINDINGS: Prompt uptake and biliary excretion of activity by the liver is
seen. Gallbladder activity is visualized at 10-15 minutes,
consistent with patency of cystic duct. Biliary activity passes into
small bowel, consistent with patent common bile duct.

Calculated gallbladder ejection fraction is 71%. (Normal gallbladder
ejection fraction with Ensure is greater than 33%.)
IMPRESSION: Normal HIDA scan with normal gallbladder ejection fraction of 71%.

## 2019-12-27 DIAGNOSIS — Z952 Presence of prosthetic heart valve: Secondary | ICD-10-CM | POA: Diagnosis not present

## 2019-12-27 DIAGNOSIS — L97422 Non-pressure chronic ulcer of left heel and midfoot with fat layer exposed: Secondary | ICD-10-CM | POA: Diagnosis not present

## 2019-12-27 DIAGNOSIS — E11621 Type 2 diabetes mellitus with foot ulcer: Secondary | ICD-10-CM | POA: Diagnosis not present

## 2020-01-01 DIAGNOSIS — M47816 Spondylosis without myelopathy or radiculopathy, lumbar region: Secondary | ICD-10-CM | POA: Diagnosis not present

## 2020-01-10 DIAGNOSIS — E11621 Type 2 diabetes mellitus with foot ulcer: Secondary | ICD-10-CM | POA: Diagnosis not present

## 2020-01-10 DIAGNOSIS — L97422 Non-pressure chronic ulcer of left heel and midfoot with fat layer exposed: Secondary | ICD-10-CM | POA: Diagnosis not present

## 2020-01-10 DIAGNOSIS — S90222A Contusion of left lesser toe(s) with damage to nail, initial encounter: Secondary | ICD-10-CM | POA: Diagnosis not present

## 2020-01-15 DIAGNOSIS — M47816 Spondylosis without myelopathy or radiculopathy, lumbar region: Secondary | ICD-10-CM | POA: Diagnosis not present

## 2020-01-17 DIAGNOSIS — E11621 Type 2 diabetes mellitus with foot ulcer: Secondary | ICD-10-CM | POA: Diagnosis not present

## 2020-01-17 DIAGNOSIS — L97422 Non-pressure chronic ulcer of left heel and midfoot with fat layer exposed: Secondary | ICD-10-CM | POA: Diagnosis not present

## 2020-02-01 DIAGNOSIS — E11621 Type 2 diabetes mellitus with foot ulcer: Secondary | ICD-10-CM | POA: Diagnosis not present

## 2020-02-01 DIAGNOSIS — Z79899 Other long term (current) drug therapy: Secondary | ICD-10-CM | POA: Diagnosis not present

## 2020-02-01 DIAGNOSIS — L97509 Non-pressure chronic ulcer of other part of unspecified foot with unspecified severity: Secondary | ICD-10-CM | POA: Diagnosis not present

## 2020-02-01 DIAGNOSIS — L97422 Non-pressure chronic ulcer of left heel and midfoot with fat layer exposed: Secondary | ICD-10-CM | POA: Diagnosis not present

## 2020-02-01 DIAGNOSIS — Z952 Presence of prosthetic heart valve: Secondary | ICD-10-CM | POA: Diagnosis not present

## 2020-02-05 DIAGNOSIS — M47816 Spondylosis without myelopathy or radiculopathy, lumbar region: Secondary | ICD-10-CM | POA: Diagnosis not present

## 2020-02-07 DIAGNOSIS — E11621 Type 2 diabetes mellitus with foot ulcer: Secondary | ICD-10-CM | POA: Diagnosis not present

## 2020-02-07 DIAGNOSIS — L97509 Non-pressure chronic ulcer of other part of unspecified foot with unspecified severity: Secondary | ICD-10-CM | POA: Diagnosis not present

## 2020-02-13 DIAGNOSIS — Z79899 Other long term (current) drug therapy: Secondary | ICD-10-CM | POA: Diagnosis not present

## 2020-02-13 DIAGNOSIS — L97509 Non-pressure chronic ulcer of other part of unspecified foot with unspecified severity: Secondary | ICD-10-CM | POA: Diagnosis not present

## 2020-02-13 DIAGNOSIS — E782 Mixed hyperlipidemia: Secondary | ICD-10-CM | POA: Diagnosis not present

## 2020-02-13 DIAGNOSIS — I480 Paroxysmal atrial fibrillation: Secondary | ICD-10-CM | POA: Diagnosis not present

## 2020-02-13 DIAGNOSIS — I10 Essential (primary) hypertension: Secondary | ICD-10-CM | POA: Diagnosis not present

## 2020-02-13 DIAGNOSIS — I2510 Atherosclerotic heart disease of native coronary artery without angina pectoris: Secondary | ICD-10-CM | POA: Diagnosis not present

## 2020-02-13 DIAGNOSIS — E11621 Type 2 diabetes mellitus with foot ulcer: Secondary | ICD-10-CM | POA: Diagnosis not present

## 2020-02-14 DIAGNOSIS — E11621 Type 2 diabetes mellitus with foot ulcer: Secondary | ICD-10-CM | POA: Diagnosis not present

## 2020-02-14 DIAGNOSIS — L97422 Non-pressure chronic ulcer of left heel and midfoot with fat layer exposed: Secondary | ICD-10-CM | POA: Diagnosis not present

## 2020-02-14 DIAGNOSIS — L97509 Non-pressure chronic ulcer of other part of unspecified foot with unspecified severity: Secondary | ICD-10-CM | POA: Diagnosis not present

## 2020-02-19 DIAGNOSIS — M47816 Spondylosis without myelopathy or radiculopathy, lumbar region: Secondary | ICD-10-CM | POA: Diagnosis not present

## 2020-02-21 DIAGNOSIS — L97422 Non-pressure chronic ulcer of left heel and midfoot with fat layer exposed: Secondary | ICD-10-CM | POA: Diagnosis not present

## 2020-02-21 DIAGNOSIS — L97509 Non-pressure chronic ulcer of other part of unspecified foot with unspecified severity: Secondary | ICD-10-CM | POA: Diagnosis not present

## 2020-02-21 DIAGNOSIS — E11621 Type 2 diabetes mellitus with foot ulcer: Secondary | ICD-10-CM | POA: Diagnosis not present

## 2020-02-28 DIAGNOSIS — L97422 Non-pressure chronic ulcer of left heel and midfoot with fat layer exposed: Secondary | ICD-10-CM | POA: Diagnosis not present

## 2020-02-28 DIAGNOSIS — L97522 Non-pressure chronic ulcer of other part of left foot with fat layer exposed: Secondary | ICD-10-CM | POA: Diagnosis not present

## 2020-03-06 DIAGNOSIS — L97422 Non-pressure chronic ulcer of left heel and midfoot with fat layer exposed: Secondary | ICD-10-CM | POA: Diagnosis not present

## 2020-03-13 DIAGNOSIS — L97522 Non-pressure chronic ulcer of other part of left foot with fat layer exposed: Secondary | ICD-10-CM | POA: Diagnosis not present

## 2020-03-13 DIAGNOSIS — E11621 Type 2 diabetes mellitus with foot ulcer: Secondary | ICD-10-CM | POA: Diagnosis not present

## 2020-03-13 DIAGNOSIS — L97422 Non-pressure chronic ulcer of left heel and midfoot with fat layer exposed: Secondary | ICD-10-CM | POA: Diagnosis not present

## 2020-03-19 DIAGNOSIS — Z952 Presence of prosthetic heart valve: Secondary | ICD-10-CM | POA: Diagnosis not present

## 2020-03-19 DIAGNOSIS — Z79899 Other long term (current) drug therapy: Secondary | ICD-10-CM | POA: Diagnosis not present

## 2020-03-19 DIAGNOSIS — M5136 Other intervertebral disc degeneration, lumbar region: Secondary | ICD-10-CM | POA: Diagnosis not present

## 2020-03-19 DIAGNOSIS — G894 Chronic pain syndrome: Secondary | ICD-10-CM | POA: Diagnosis not present

## 2020-03-19 DIAGNOSIS — L97422 Non-pressure chronic ulcer of left heel and midfoot with fat layer exposed: Secondary | ICD-10-CM | POA: Diagnosis not present

## 2020-03-19 DIAGNOSIS — Z79891 Long term (current) use of opiate analgesic: Secondary | ICD-10-CM | POA: Diagnosis not present

## 2020-03-19 DIAGNOSIS — M47816 Spondylosis without myelopathy or radiculopathy, lumbar region: Secondary | ICD-10-CM | POA: Diagnosis not present

## 2020-03-26 DIAGNOSIS — L97522 Non-pressure chronic ulcer of other part of left foot with fat layer exposed: Secondary | ICD-10-CM | POA: Diagnosis not present

## 2020-03-26 DIAGNOSIS — L97422 Non-pressure chronic ulcer of left heel and midfoot with fat layer exposed: Secondary | ICD-10-CM | POA: Diagnosis not present

## 2020-03-26 DIAGNOSIS — E11621 Type 2 diabetes mellitus with foot ulcer: Secondary | ICD-10-CM | POA: Diagnosis not present

## 2020-04-02 DIAGNOSIS — L97422 Non-pressure chronic ulcer of left heel and midfoot with fat layer exposed: Secondary | ICD-10-CM | POA: Diagnosis not present

## 2020-04-02 DIAGNOSIS — E11621 Type 2 diabetes mellitus with foot ulcer: Secondary | ICD-10-CM | POA: Diagnosis not present

## 2020-04-09 DIAGNOSIS — E11621 Type 2 diabetes mellitus with foot ulcer: Secondary | ICD-10-CM | POA: Diagnosis not present

## 2020-04-09 DIAGNOSIS — L97509 Non-pressure chronic ulcer of other part of unspecified foot with unspecified severity: Secondary | ICD-10-CM | POA: Diagnosis not present

## 2020-04-09 DIAGNOSIS — L97422 Non-pressure chronic ulcer of left heel and midfoot with fat layer exposed: Secondary | ICD-10-CM | POA: Diagnosis not present

## 2020-04-16 DIAGNOSIS — L97509 Non-pressure chronic ulcer of other part of unspecified foot with unspecified severity: Secondary | ICD-10-CM | POA: Diagnosis not present

## 2020-04-16 DIAGNOSIS — L97422 Non-pressure chronic ulcer of left heel and midfoot with fat layer exposed: Secondary | ICD-10-CM | POA: Diagnosis not present

## 2020-04-16 DIAGNOSIS — Z952 Presence of prosthetic heart valve: Secondary | ICD-10-CM | POA: Diagnosis not present

## 2020-04-16 DIAGNOSIS — E11621 Type 2 diabetes mellitus with foot ulcer: Secondary | ICD-10-CM | POA: Diagnosis not present

## 2020-04-23 DIAGNOSIS — E11621 Type 2 diabetes mellitus with foot ulcer: Secondary | ICD-10-CM | POA: Diagnosis not present

## 2020-04-23 DIAGNOSIS — L97509 Non-pressure chronic ulcer of other part of unspecified foot with unspecified severity: Secondary | ICD-10-CM | POA: Diagnosis not present

## 2020-04-23 DIAGNOSIS — L97422 Non-pressure chronic ulcer of left heel and midfoot with fat layer exposed: Secondary | ICD-10-CM | POA: Diagnosis not present

## 2020-04-30 DIAGNOSIS — L97422 Non-pressure chronic ulcer of left heel and midfoot with fat layer exposed: Secondary | ICD-10-CM | POA: Diagnosis not present

## 2020-04-30 DIAGNOSIS — E11621 Type 2 diabetes mellitus with foot ulcer: Secondary | ICD-10-CM | POA: Diagnosis not present

## 2020-04-30 DIAGNOSIS — M47816 Spondylosis without myelopathy or radiculopathy, lumbar region: Secondary | ICD-10-CM | POA: Diagnosis not present

## 2020-04-30 DIAGNOSIS — G894 Chronic pain syndrome: Secondary | ICD-10-CM | POA: Diagnosis not present

## 2020-04-30 DIAGNOSIS — L97521 Non-pressure chronic ulcer of other part of left foot limited to breakdown of skin: Secondary | ICD-10-CM | POA: Diagnosis not present

## 2020-04-30 DIAGNOSIS — I878 Other specified disorders of veins: Secondary | ICD-10-CM | POA: Diagnosis not present

## 2020-04-30 DIAGNOSIS — L97509 Non-pressure chronic ulcer of other part of unspecified foot with unspecified severity: Secondary | ICD-10-CM | POA: Diagnosis not present

## 2020-05-02 DIAGNOSIS — J90 Pleural effusion, not elsewhere classified: Secondary | ICD-10-CM | POA: Diagnosis not present

## 2020-05-02 DIAGNOSIS — I6523 Occlusion and stenosis of bilateral carotid arteries: Secondary | ICD-10-CM | POA: Diagnosis not present

## 2020-05-02 DIAGNOSIS — I350 Nonrheumatic aortic (valve) stenosis: Secondary | ICD-10-CM | POA: Diagnosis not present

## 2020-05-02 DIAGNOSIS — R600 Localized edema: Secondary | ICD-10-CM | POA: Diagnosis not present

## 2020-05-02 DIAGNOSIS — I2510 Atherosclerotic heart disease of native coronary artery without angina pectoris: Secondary | ICD-10-CM | POA: Diagnosis not present

## 2020-05-02 DIAGNOSIS — I4891 Unspecified atrial fibrillation: Secondary | ICD-10-CM | POA: Diagnosis not present

## 2020-05-02 DIAGNOSIS — Z79899 Other long term (current) drug therapy: Secondary | ICD-10-CM | POA: Diagnosis not present

## 2020-05-02 DIAGNOSIS — I10 Essential (primary) hypertension: Secondary | ICD-10-CM | POA: Diagnosis not present

## 2020-05-02 DIAGNOSIS — I480 Paroxysmal atrial fibrillation: Secondary | ICD-10-CM | POA: Diagnosis not present

## 2020-05-02 DIAGNOSIS — I700 Atherosclerosis of aorta: Secondary | ICD-10-CM | POA: Diagnosis not present

## 2020-05-02 DIAGNOSIS — E782 Mixed hyperlipidemia: Secondary | ICD-10-CM | POA: Diagnosis not present

## 2020-05-07 DIAGNOSIS — L97422 Non-pressure chronic ulcer of left heel and midfoot with fat layer exposed: Secondary | ICD-10-CM | POA: Diagnosis not present

## 2020-05-07 DIAGNOSIS — L97509 Non-pressure chronic ulcer of other part of unspecified foot with unspecified severity: Secondary | ICD-10-CM | POA: Diagnosis not present

## 2020-05-07 DIAGNOSIS — E11621 Type 2 diabetes mellitus with foot ulcer: Secondary | ICD-10-CM | POA: Diagnosis not present

## 2020-05-14 DIAGNOSIS — L97509 Non-pressure chronic ulcer of other part of unspecified foot with unspecified severity: Secondary | ICD-10-CM | POA: Diagnosis not present

## 2020-05-14 DIAGNOSIS — Z952 Presence of prosthetic heart valve: Secondary | ICD-10-CM | POA: Diagnosis not present

## 2020-05-14 DIAGNOSIS — E11621 Type 2 diabetes mellitus with foot ulcer: Secondary | ICD-10-CM | POA: Diagnosis not present

## 2020-05-14 DIAGNOSIS — L97422 Non-pressure chronic ulcer of left heel and midfoot with fat layer exposed: Secondary | ICD-10-CM | POA: Diagnosis not present

## 2020-05-28 DIAGNOSIS — L97422 Non-pressure chronic ulcer of left heel and midfoot with fat layer exposed: Secondary | ICD-10-CM | POA: Diagnosis not present

## 2020-05-28 DIAGNOSIS — L97509 Non-pressure chronic ulcer of other part of unspecified foot with unspecified severity: Secondary | ICD-10-CM | POA: Diagnosis not present

## 2020-05-28 DIAGNOSIS — E11621 Type 2 diabetes mellitus with foot ulcer: Secondary | ICD-10-CM | POA: Diagnosis not present

## 2020-05-31 DIAGNOSIS — H90A22 Sensorineural hearing loss, unilateral, left ear, with restricted hearing on the contralateral side: Secondary | ICD-10-CM | POA: Diagnosis not present

## 2020-05-31 DIAGNOSIS — H903 Sensorineural hearing loss, bilateral: Secondary | ICD-10-CM | POA: Diagnosis not present

## 2020-05-31 DIAGNOSIS — H6123 Impacted cerumen, bilateral: Secondary | ICD-10-CM | POA: Diagnosis not present

## 2020-06-05 DIAGNOSIS — I878 Other specified disorders of veins: Secondary | ICD-10-CM | POA: Diagnosis not present

## 2020-06-05 DIAGNOSIS — I739 Peripheral vascular disease, unspecified: Secondary | ICD-10-CM | POA: Diagnosis not present

## 2020-06-05 DIAGNOSIS — L97422 Non-pressure chronic ulcer of left heel and midfoot with fat layer exposed: Secondary | ICD-10-CM | POA: Diagnosis not present

## 2020-06-05 DIAGNOSIS — E11621 Type 2 diabetes mellitus with foot ulcer: Secondary | ICD-10-CM | POA: Diagnosis not present

## 2020-06-07 DIAGNOSIS — Z79899 Other long term (current) drug therapy: Secondary | ICD-10-CM | POA: Diagnosis not present

## 2020-06-07 DIAGNOSIS — L97509 Non-pressure chronic ulcer of other part of unspecified foot with unspecified severity: Secondary | ICD-10-CM | POA: Diagnosis not present

## 2020-06-07 DIAGNOSIS — E11621 Type 2 diabetes mellitus with foot ulcer: Secondary | ICD-10-CM | POA: Diagnosis not present

## 2020-06-07 DIAGNOSIS — I10 Essential (primary) hypertension: Secondary | ICD-10-CM | POA: Diagnosis not present

## 2020-06-07 DIAGNOSIS — E782 Mixed hyperlipidemia: Secondary | ICD-10-CM | POA: Diagnosis not present

## 2020-06-07 DIAGNOSIS — Z952 Presence of prosthetic heart valve: Secondary | ICD-10-CM | POA: Diagnosis not present

## 2020-06-11 DIAGNOSIS — M47816 Spondylosis without myelopathy or radiculopathy, lumbar region: Secondary | ICD-10-CM | POA: Diagnosis not present

## 2020-06-11 DIAGNOSIS — G894 Chronic pain syndrome: Secondary | ICD-10-CM | POA: Diagnosis not present

## 2020-06-12 DIAGNOSIS — E11621 Type 2 diabetes mellitus with foot ulcer: Secondary | ICD-10-CM | POA: Diagnosis not present

## 2020-06-12 DIAGNOSIS — L97422 Non-pressure chronic ulcer of left heel and midfoot with fat layer exposed: Secondary | ICD-10-CM | POA: Diagnosis not present

## 2020-06-14 ENCOUNTER — Other Ambulatory Visit: Payer: Self-pay | Admitting: Internal Medicine

## 2020-06-14 DIAGNOSIS — E11621 Type 2 diabetes mellitus with foot ulcer: Secondary | ICD-10-CM | POA: Diagnosis not present

## 2020-06-14 DIAGNOSIS — R270 Ataxia, unspecified: Secondary | ICD-10-CM | POA: Diagnosis not present

## 2020-06-14 DIAGNOSIS — I2510 Atherosclerotic heart disease of native coronary artery without angina pectoris: Secondary | ICD-10-CM | POA: Diagnosis not present

## 2020-06-14 DIAGNOSIS — R946 Abnormal results of thyroid function studies: Secondary | ICD-10-CM | POA: Diagnosis not present

## 2020-06-14 DIAGNOSIS — E782 Mixed hyperlipidemia: Secondary | ICD-10-CM | POA: Diagnosis not present

## 2020-06-14 DIAGNOSIS — L97509 Non-pressure chronic ulcer of other part of unspecified foot with unspecified severity: Secondary | ICD-10-CM | POA: Diagnosis not present

## 2020-06-14 DIAGNOSIS — I10 Essential (primary) hypertension: Secondary | ICD-10-CM | POA: Diagnosis not present

## 2020-06-19 DIAGNOSIS — S90822A Blister (nonthermal), left foot, initial encounter: Secondary | ICD-10-CM | POA: Diagnosis not present

## 2020-06-19 DIAGNOSIS — E11621 Type 2 diabetes mellitus with foot ulcer: Secondary | ICD-10-CM | POA: Diagnosis not present

## 2020-06-19 DIAGNOSIS — L97422 Non-pressure chronic ulcer of left heel and midfoot with fat layer exposed: Secondary | ICD-10-CM | POA: Diagnosis not present

## 2020-06-19 DIAGNOSIS — I739 Peripheral vascular disease, unspecified: Secondary | ICD-10-CM | POA: Diagnosis not present

## 2020-06-28 ENCOUNTER — Ambulatory Visit
Admission: RE | Admit: 2020-06-28 | Discharge: 2020-06-28 | Disposition: A | Discharge status: Home / Self Care | Payer: PPO | Source: Ambulatory Visit | Attending: Cardiology | Admitting: Cardiology

## 2020-06-28 ENCOUNTER — Other Ambulatory Visit: Payer: Self-pay | Admitting: Cardiology

## 2020-06-28 ENCOUNTER — Encounter (INDEPENDENT_AMBULATORY_CARE_PROVIDER_SITE_OTHER): Payer: Self-pay

## 2020-06-28 ENCOUNTER — Other Ambulatory Visit: Payer: Self-pay

## 2020-06-28 DIAGNOSIS — R600 Localized edema: Secondary | ICD-10-CM

## 2020-07-02 DIAGNOSIS — H903 Sensorineural hearing loss, bilateral: Secondary | ICD-10-CM | POA: Diagnosis not present

## 2020-07-05 DIAGNOSIS — E119 Type 2 diabetes mellitus without complications: Secondary | ICD-10-CM | POA: Diagnosis not present

## 2020-07-06 ENCOUNTER — Ambulatory Visit
Admission: RE | Admit: 2020-07-06 | Discharge: 2020-07-06 | Disposition: A | Discharge status: Home / Self Care | Payer: PPO | Source: Ambulatory Visit | Attending: Internal Medicine | Admitting: Internal Medicine

## 2020-07-06 ENCOUNTER — Other Ambulatory Visit: Payer: Self-pay

## 2020-07-06 DIAGNOSIS — R9082 White matter disease, unspecified: Secondary | ICD-10-CM | POA: Diagnosis not present

## 2020-07-06 DIAGNOSIS — R270 Ataxia, unspecified: Secondary | ICD-10-CM | POA: Diagnosis not present

## 2020-07-09 DIAGNOSIS — R600 Localized edema: Secondary | ICD-10-CM | POA: Diagnosis not present

## 2020-07-09 DIAGNOSIS — Z952 Presence of prosthetic heart valve: Secondary | ICD-10-CM | POA: Diagnosis not present

## 2020-07-10 DIAGNOSIS — E11621 Type 2 diabetes mellitus with foot ulcer: Secondary | ICD-10-CM | POA: Diagnosis not present

## 2020-07-10 DIAGNOSIS — L97422 Non-pressure chronic ulcer of left heel and midfoot with fat layer exposed: Secondary | ICD-10-CM | POA: Diagnosis not present

## 2020-07-10 DIAGNOSIS — L97521 Non-pressure chronic ulcer of other part of left foot limited to breakdown of skin: Secondary | ICD-10-CM | POA: Diagnosis not present

## 2020-07-10 DIAGNOSIS — I872 Venous insufficiency (chronic) (peripheral): Secondary | ICD-10-CM | POA: Diagnosis not present

## 2020-07-10 DIAGNOSIS — I739 Peripheral vascular disease, unspecified: Secondary | ICD-10-CM | POA: Diagnosis not present

## 2020-07-16 DIAGNOSIS — I10 Essential (primary) hypertension: Secondary | ICD-10-CM | POA: Diagnosis not present

## 2020-07-16 DIAGNOSIS — E782 Mixed hyperlipidemia: Secondary | ICD-10-CM | POA: Diagnosis not present

## 2020-07-16 DIAGNOSIS — I2510 Atherosclerotic heart disease of native coronary artery without angina pectoris: Secondary | ICD-10-CM | POA: Diagnosis not present

## 2020-07-16 DIAGNOSIS — R600 Localized edema: Secondary | ICD-10-CM | POA: Diagnosis not present

## 2020-07-17 ENCOUNTER — Other Ambulatory Visit (INDEPENDENT_AMBULATORY_CARE_PROVIDER_SITE_OTHER): Payer: Self-pay | Admitting: Nurse Practitioner

## 2020-07-17 ENCOUNTER — Other Ambulatory Visit (INDEPENDENT_AMBULATORY_CARE_PROVIDER_SITE_OTHER): Payer: Self-pay | Admitting: Vascular Surgery

## 2020-07-17 DIAGNOSIS — L97429 Non-pressure chronic ulcer of left heel and midfoot with unspecified severity: Secondary | ICD-10-CM

## 2020-07-17 DIAGNOSIS — E11621 Type 2 diabetes mellitus with foot ulcer: Secondary | ICD-10-CM | POA: Diagnosis not present

## 2020-07-17 DIAGNOSIS — L97521 Non-pressure chronic ulcer of other part of left foot limited to breakdown of skin: Secondary | ICD-10-CM | POA: Diagnosis not present

## 2020-07-17 DIAGNOSIS — L97422 Non-pressure chronic ulcer of left heel and midfoot with fat layer exposed: Secondary | ICD-10-CM | POA: Diagnosis not present

## 2020-07-17 DIAGNOSIS — I872 Venous insufficiency (chronic) (peripheral): Secondary | ICD-10-CM | POA: Diagnosis not present

## 2020-07-19 ENCOUNTER — Ambulatory Visit (INDEPENDENT_AMBULATORY_CARE_PROVIDER_SITE_OTHER): Payer: PPO

## 2020-07-19 ENCOUNTER — Other Ambulatory Visit: Payer: Self-pay

## 2020-07-19 ENCOUNTER — Encounter (INDEPENDENT_AMBULATORY_CARE_PROVIDER_SITE_OTHER): Payer: Self-pay | Admitting: Nurse Practitioner

## 2020-07-19 ENCOUNTER — Ambulatory Visit (INDEPENDENT_AMBULATORY_CARE_PROVIDER_SITE_OTHER): Payer: PPO | Admitting: Nurse Practitioner

## 2020-07-19 VITALS — BP 157/72 | HR 73 | Resp 16 | Wt 272.0 lb

## 2020-07-19 DIAGNOSIS — I10 Essential (primary) hypertension: Secondary | ICD-10-CM | POA: Diagnosis not present

## 2020-07-19 DIAGNOSIS — G629 Polyneuropathy, unspecified: Secondary | ICD-10-CM | POA: Insufficient documentation

## 2020-07-19 DIAGNOSIS — M7070 Other bursitis of hip, unspecified hip: Secondary | ICD-10-CM | POA: Insufficient documentation

## 2020-07-19 DIAGNOSIS — D369 Benign neoplasm, unspecified site: Secondary | ICD-10-CM | POA: Insufficient documentation

## 2020-07-19 DIAGNOSIS — R600 Localized edema: Secondary | ICD-10-CM | POA: Diagnosis not present

## 2020-07-19 DIAGNOSIS — E782 Mixed hyperlipidemia: Secondary | ICD-10-CM | POA: Diagnosis not present

## 2020-07-19 DIAGNOSIS — R7303 Prediabetes: Secondary | ICD-10-CM | POA: Insufficient documentation

## 2020-07-19 DIAGNOSIS — I700 Atherosclerosis of aorta: Secondary | ICD-10-CM | POA: Insufficient documentation

## 2020-07-19 DIAGNOSIS — L97429 Non-pressure chronic ulcer of left heel and midfoot with unspecified severity: Secondary | ICD-10-CM

## 2020-07-19 DIAGNOSIS — K2970 Gastritis, unspecified, without bleeding: Secondary | ICD-10-CM | POA: Insufficient documentation

## 2020-07-19 DIAGNOSIS — R269 Unspecified abnormalities of gait and mobility: Secondary | ICD-10-CM | POA: Insufficient documentation

## 2020-07-19 DIAGNOSIS — I272 Pulmonary hypertension, unspecified: Secondary | ICD-10-CM | POA: Insufficient documentation

## 2020-07-19 NOTE — Progress Notes (Signed)
Subjective:    Patient ID: [name], male    DOB: 09/25/1946, 74 y.o.   MRN: [id number] Chief Complaint  Patient presents with  . New Patient (Initial Visit)    ref [name] abi ulcer    [name] is a 74 year old male who presents today for evaluation of ulcerations on his left foot.  The patient has an ulceration on the left forefoot as well as the heel.  The patient notes that this wound has been ongoing for approximately 2 years.  Per podiatry's notes the heel ulcer appears to be slightly larger than it was previously.  Today the wounds do not have any slough or eschar present.  No copious drainage present.  It is noted that the wound bed is somewhat pale however.  The patient had significant issues with swelling however recent changes by his cardiologist have helped greatly with his edema.  There is no evidence of infection present in the wounds today.  The patient was previously tried any orthopedic boot however the patient was unable to tolerate it.  The patient tries to stay off of his feet as much as possible to help with wound healing.  This is greatly affecting his quality of life.  Today noninvasive studies show an ABI of 1.12 bilaterally.  The right great toe pressure is 115 and the left is 123.  The patient has audible triphasic tibial artery waveforms bilaterally with good toe waveforms bilaterally.   Review of Systems  Cardiovascular: Positive for leg swelling.  Musculoskeletal: Positive for arthralgias and gait problem.  Skin: Positive for wound.  All other systems reviewed and are negative.      Objective:   Physical Exam Vitals reviewed.  Cardiovascular:     Rate and Rhythm: Normal rate.     Pulses: Normal pulses.  Pulmonary:     Effort: Pulmonary effort is normal.  Musculoskeletal:       Feet:  Feet:     Left foot:     Skin integrity: Ulcer present.  Skin:    General: Skin is warm and dry.  Neurological:     Mental Status: He is alert and  oriented to person, place, and time. Mental status is at baseline.     Gait: Gait abnormal.  Psychiatric:        Mood and Affect: Mood normal.        Behavior: Behavior normal.        Thought Content: Thought content normal.        Judgment: Judgment normal.     BP (!) 157/72 (BP Location: Right Arm)   Pulse 73   Resp 16   Wt 272 lb (123.4 kg)   BMI 39.03 kg/m   Past Medical History:  Diagnosis Date  . CHF (congestive heart failure) ([place])   . Chronic pain   . Coronary artery disease   . DDD (degenerative disc disease), hip   . Diabetes mellitus without complication ([place])   . DJD (degenerative joint disease)   . Dysrhythmia   . GERD (gastroesophageal reflux disease)   . Hyperlipemia   . Hypertension   . Insomnia   . Neuropathy   . Paroxysmal A-fib ([place])   . Peripheral neuropathy   . Stroke ([place])   . Thoracic aortic atherosclerosis ([facility])     Social History   Socioeconomic History  . Marital status: Married    Spouse name: Not on file  . Number of children: Not on file  . Years  of education: Not on file  . Highest education level: Not on file  Occupational History  . Not on file  Tobacco Use  . Smoking status: Former Research scientist (life sciences)  . Smokeless tobacco: Never Used  Vaping Use  . Vaping Use: Never used  Substance and Sexual Activity  . Alcohol use: Yes    Alcohol/week: 1.0 standard drink    Types: 1 Shots of liquor per week  . Drug use: No  . Sexual activity: Yes  Other Topics Concern  . Not on file  Social History Narrative  . Not on file   Social Determinants of Health   Financial Resource Strain:   . Difficulty of Paying Living Expenses: Not on file  Food Insecurity:   . Worried About Charity fundraiser in the Last Year: Not on file  . Ran Out of Food in the Last Year: Not on file  Transportation Needs:   . Lack of Transportation (Medical): Not on file  . Lack of Transportation (Non-Medical): Not on file  Physical Activity:   . Days of Exercise per  Week: Not on file  . Minutes of Exercise per Session: Not on file  Stress:   . Feeling of Stress : Not on file  Social Connections:   . Frequency of Communication with Friends and Family: Not on file  . Frequency of Social Gatherings with Friends and Family: Not on file  . Attends Religious Services: Not on file  . Active Member of Clubs or Organizations: Not on file  . Attends Archivist Meetings: Not on file  . Marital Status: Not on file  Intimate Partner Violence:   . Fear of Current or Ex-Partner: Not on file  . Emotionally Abused: Not on file  . Physically Abused: Not on file  . Sexually Abused: Not on file    Past Surgical History:  Procedure Laterality Date  . BACK SURGERY    . CARDIAC SURGERY    . CARDIAC VALVE REPLACEMENT    . CARDIOVERSION N/A 05/04/2017   Procedure: CARDIOVERSION;  Surgeon: Corey Skains, MD;  Location: [facility] ORS;  Service: Cardiovascular;  Laterality: N/A;  . carpel tunnel release    . CATARACT EXTRACTION W/ INTRAOCULAR LENS IMPLANT Bilateral   . COLONOSCOPY    . COLONOSCOPY WITH ESOPHAGOGASTRODUODENOSCOPY (EGD)    . COLONOSCOPY WITH PROPOFOL N/A 05/16/2018   Procedure: COLONOSCOPY WITH PROPOFOL;  Surgeon: Manya Silvas, MD;  Location: [facility] ENDOSCOPY;  Service: Endoscopy;  Laterality: N/A;  . ESOPHAGOGASTRODUODENOSCOPY (EGD) WITH PROPOFOL N/A 05/16/2018   Procedure: ESOPHAGOGASTRODUODENOSCOPY (EGD) WITH PROPOFOL;  Surgeon: Manya Silvas, MD;  Location: [facility] ENDOSCOPY;  Service: Endoscopy;  Laterality: N/A;  . JOINT REPLACEMENT    . TEE WITHOUT CARDIOVERSION N/A 10/08/2016   Procedure: TRANSESOPHAGEAL ECHOCARDIOGRAM (TEE);  Surgeon: Teodoro Spray, MD;  Location: [facility] ORS;  Service: Cardiovascular;  Laterality: N/A;  . TEE WITHOUT CARDIOVERSION N/A 11/23/2016   Procedure: TRANSESOPHAGEAL ECHOCARDIOGRAM (TEE);  Surgeon: Isaias Cowman, MD;  Location: [facility] ORS;  Service: Cardiovascular;  Laterality: N/A;  . TONSILLECTOMY    . VSD  REPAIR      Family History  Problem Relation Age of Onset  . Stroke Mother   . Heart attack Mother   . Heart attack Father     Allergies  Allergen Reactions  . Acetaminophen-Codeine     Other reaction(s): Headache Other reaction(s): Headache  . Demeclocycline Hives  . Tetracycline Hives  . Tetracyclines & Related Hives  Assessment & Plan:   1. Heel ulceration, left, with unspecified severity ([place]) Based on the patient's noninvasive studies he should have adequate perfusion for wound healing.  Based on the extended length of time the patient has had his wounds as well as concern for growing larger discussed angiogram with the patient.  Angiogram would be done to examine for the possibility of atherosclerotic disease that may not have been apparent on ultrasound.  Patient was hesitant to proceed with angiogram, therefore we discussed wound care referral.  We will refer the patient to the wound care clinic for possible thoughts on treatment options.  If the patient continues to show slow or no signs of healing, I have encouraged him to reach out to our office as we can revisit the idea of angiogram.  Otherwise we will follow up with [place] as needed. - Ambulatory referral to Wound Clinic  2. Bilateral leg edema The patient had severe swelling and has been placed in an [place] wrap.  This may also have been hindering his ability to heal.  Today the swelling is under good control.  This also may be due to the fact that his cardiologist recently changed some of his diuretics.  3. Benign essential hypertension Continue antihypertensive medications as already ordered, these medications have been reviewed and there are no changes at this time.   4. Hyperlipidemia, mixed Continue statin as ordered and reviewed, no changes at this time    Current Outpatient Medications on File Prior to Visit  Medication Sig Dispense Refill  . amiodarone (PACERONE) 200 MG tablet Take 1 tablet (200 mg  total) by mouth daily. 30 tablet 1  . amitriptyline (ELAVIL) 10 MG tablet Take by mouth.    Marland Kitchen aspirin EC 81 MG tablet Take 81 mg by mouth daily.    . cyanocobalamin 1000 MCG tablet Take by mouth.    . cyclobenzaprine (FLEXERIL) 10 MG tablet Take 10 mg by mouth daily as needed for muscle spasms.     . DULoxetine (CYMBALTA) 30 MG capsule Take 30 mg by mouth every evening.    . ferrous sulfate 325 (65 FE) MG tablet Take 1 tablet (325 mg total) by mouth 2 (two) times daily with a meal. 60 tablet 3  . gabapentin (NEURONTIN) 600 MG tablet Take 600 mg by mouth 2 (two) times daily.    Marland Kitchen KLOR-CON M20 20 MEQ tablet Take by mouth.    . loratadine (CLARITIN) 10 MG tablet Take 10 mg by mouth daily.    . meclizine (ANTIVERT) 25 MG tablet Take 1 tablet by mouth 3 (three) times daily.    . metFORMIN (GLUCOPHAGE) 1000 MG tablet Take 1,000 mg by mouth 2 (two) times daily. With meals.    . methadone (DOLOPHINE) 5 MG tablet Take 5 mg by mouth 2 (two) times daily.     . metoprolol tartrate (LOPRESSOR) 25 MG tablet Take 25 mg by mouth 2 (two) times daily.     . Multiple Vitamin (MULTI-VITAMINS) TABS Take 1 tablet by mouth daily.    . pantoprazole (PROTONIX) 40 MG tablet Take 40 mg by mouth daily.    . pravastatin (PRAVACHOL) 40 MG tablet Take 40 mg by mouth every evening.     . torsemide (DEMADEX) 20 MG tablet Take by mouth.    . warfarin (COUMADIN) 5 MG tablet Take 5 mg by mouth daily.    . furosemide (LASIX) 40 MG tablet Take 40 mg by mouth daily.  (Patient not taking: Reported on 07/19/2020)    .  pioglitazone (ACTOS) 30 MG tablet Take 30 mg by mouth daily.     No current facility-administered medications on file prior to visit.    There are no Patient Instructions on file for this visit. No follow-ups on file.   Kris Hartmann, NP

## 2020-07-23 DIAGNOSIS — M47816 Spondylosis without myelopathy or radiculopathy, lumbar region: Secondary | ICD-10-CM | POA: Diagnosis not present

## 2020-07-23 DIAGNOSIS — G894 Chronic pain syndrome: Secondary | ICD-10-CM | POA: Diagnosis not present

## 2020-07-24 DIAGNOSIS — E11621 Type 2 diabetes mellitus with foot ulcer: Secondary | ICD-10-CM | POA: Diagnosis not present

## 2020-07-24 DIAGNOSIS — L97422 Non-pressure chronic ulcer of left heel and midfoot with fat layer exposed: Secondary | ICD-10-CM | POA: Diagnosis not present

## 2020-07-24 DIAGNOSIS — L97521 Non-pressure chronic ulcer of other part of left foot limited to breakdown of skin: Secondary | ICD-10-CM | POA: Diagnosis not present

## 2020-08-01 DIAGNOSIS — E0590 Thyrotoxicosis, unspecified without thyrotoxic crisis or storm: Secondary | ICD-10-CM | POA: Diagnosis not present

## 2020-08-01 DIAGNOSIS — L97422 Non-pressure chronic ulcer of left heel and midfoot with fat layer exposed: Secondary | ICD-10-CM | POA: Diagnosis not present

## 2020-08-01 DIAGNOSIS — E11621 Type 2 diabetes mellitus with foot ulcer: Secondary | ICD-10-CM | POA: Diagnosis not present

## 2020-08-01 DIAGNOSIS — L97521 Non-pressure chronic ulcer of other part of left foot limited to breakdown of skin: Secondary | ICD-10-CM | POA: Diagnosis not present

## 2020-08-07 DIAGNOSIS — L97422 Non-pressure chronic ulcer of left heel and midfoot with fat layer exposed: Secondary | ICD-10-CM | POA: Diagnosis not present

## 2020-08-07 DIAGNOSIS — L97521 Non-pressure chronic ulcer of other part of left foot limited to breakdown of skin: Secondary | ICD-10-CM | POA: Diagnosis not present

## 2020-08-07 DIAGNOSIS — Z952 Presence of prosthetic heart valve: Secondary | ICD-10-CM | POA: Diagnosis not present

## 2020-08-07 DIAGNOSIS — E11621 Type 2 diabetes mellitus with foot ulcer: Secondary | ICD-10-CM | POA: Diagnosis not present

## 2020-08-14 DIAGNOSIS — L97422 Non-pressure chronic ulcer of left heel and midfoot with fat layer exposed: Secondary | ICD-10-CM | POA: Diagnosis not present

## 2020-08-14 DIAGNOSIS — E11621 Type 2 diabetes mellitus with foot ulcer: Secondary | ICD-10-CM | POA: Diagnosis not present

## 2020-08-14 DIAGNOSIS — L97521 Non-pressure chronic ulcer of other part of left foot limited to breakdown of skin: Secondary | ICD-10-CM | POA: Diagnosis not present

## 2020-08-22 DIAGNOSIS — I739 Peripheral vascular disease, unspecified: Secondary | ICD-10-CM | POA: Diagnosis not present

## 2020-08-22 DIAGNOSIS — L97521 Non-pressure chronic ulcer of other part of left foot limited to breakdown of skin: Secondary | ICD-10-CM | POA: Diagnosis not present

## 2020-08-22 DIAGNOSIS — L03115 Cellulitis of right lower limb: Secondary | ICD-10-CM | POA: Diagnosis not present

## 2020-08-22 DIAGNOSIS — E11621 Type 2 diabetes mellitus with foot ulcer: Secondary | ICD-10-CM | POA: Diagnosis not present

## 2020-08-29 DIAGNOSIS — I739 Peripheral vascular disease, unspecified: Secondary | ICD-10-CM | POA: Diagnosis not present

## 2020-08-29 DIAGNOSIS — L97521 Non-pressure chronic ulcer of other part of left foot limited to breakdown of skin: Secondary | ICD-10-CM | POA: Diagnosis not present

## 2020-08-29 DIAGNOSIS — S81801D Unspecified open wound, right lower leg, subsequent encounter: Secondary | ICD-10-CM | POA: Diagnosis not present

## 2020-08-29 DIAGNOSIS — L97509 Non-pressure chronic ulcer of other part of unspecified foot with unspecified severity: Secondary | ICD-10-CM | POA: Diagnosis not present

## 2020-08-29 DIAGNOSIS — E11621 Type 2 diabetes mellitus with foot ulcer: Secondary | ICD-10-CM | POA: Diagnosis not present

## 2020-09-03 ENCOUNTER — Ambulatory Visit: Payer: PPO | Admitting: Physician Assistant

## 2020-09-03 DIAGNOSIS — G894 Chronic pain syndrome: Secondary | ICD-10-CM | POA: Diagnosis not present

## 2020-09-03 DIAGNOSIS — Z79899 Other long term (current) drug therapy: Secondary | ICD-10-CM | POA: Diagnosis not present

## 2020-09-03 DIAGNOSIS — Z79891 Long term (current) use of opiate analgesic: Secondary | ICD-10-CM | POA: Diagnosis not present

## 2020-09-03 DIAGNOSIS — M47816 Spondylosis without myelopathy or radiculopathy, lumbar region: Secondary | ICD-10-CM | POA: Diagnosis not present

## 2020-09-04 DIAGNOSIS — E11621 Type 2 diabetes mellitus with foot ulcer: Secondary | ICD-10-CM | POA: Diagnosis not present

## 2020-09-04 DIAGNOSIS — L97509 Non-pressure chronic ulcer of other part of unspecified foot with unspecified severity: Secondary | ICD-10-CM | POA: Diagnosis not present

## 2020-09-04 DIAGNOSIS — L97521 Non-pressure chronic ulcer of other part of left foot limited to breakdown of skin: Secondary | ICD-10-CM | POA: Diagnosis not present

## 2020-09-04 DIAGNOSIS — Z952 Presence of prosthetic heart valve: Secondary | ICD-10-CM | POA: Diagnosis not present

## 2020-09-18 DIAGNOSIS — Z125 Encounter for screening for malignant neoplasm of prostate: Secondary | ICD-10-CM | POA: Diagnosis not present

## 2020-09-18 DIAGNOSIS — Z23 Encounter for immunization: Secondary | ICD-10-CM | POA: Diagnosis not present

## 2020-09-18 DIAGNOSIS — I10 Essential (primary) hypertension: Secondary | ICD-10-CM | POA: Diagnosis not present

## 2020-09-18 DIAGNOSIS — L97509 Non-pressure chronic ulcer of other part of unspecified foot with unspecified severity: Secondary | ICD-10-CM | POA: Diagnosis not present

## 2020-09-18 DIAGNOSIS — E11621 Type 2 diabetes mellitus with foot ulcer: Secondary | ICD-10-CM | POA: Diagnosis not present

## 2020-09-18 DIAGNOSIS — E782 Mixed hyperlipidemia: Secondary | ICD-10-CM | POA: Diagnosis not present

## 2020-09-18 DIAGNOSIS — E0590 Thyrotoxicosis, unspecified without thyrotoxic crisis or storm: Secondary | ICD-10-CM | POA: Diagnosis not present

## 2020-09-18 DIAGNOSIS — G894 Chronic pain syndrome: Secondary | ICD-10-CM | POA: Diagnosis not present

## 2020-09-18 DIAGNOSIS — Z79899 Other long term (current) drug therapy: Secondary | ICD-10-CM | POA: Diagnosis not present

## 2020-09-18 DIAGNOSIS — I2510 Atherosclerotic heart disease of native coronary artery without angina pectoris: Secondary | ICD-10-CM | POA: Diagnosis not present

## 2020-09-19 DIAGNOSIS — L97509 Non-pressure chronic ulcer of other part of unspecified foot with unspecified severity: Secondary | ICD-10-CM | POA: Diagnosis not present

## 2020-09-19 DIAGNOSIS — L97521 Non-pressure chronic ulcer of other part of left foot limited to breakdown of skin: Secondary | ICD-10-CM | POA: Diagnosis not present

## 2020-09-19 DIAGNOSIS — E11621 Type 2 diabetes mellitus with foot ulcer: Secondary | ICD-10-CM | POA: Diagnosis not present

## 2020-09-24 ENCOUNTER — Other Ambulatory Visit: Payer: Self-pay

## 2020-09-24 ENCOUNTER — Encounter: Payer: PPO | Attending: Physician Assistant | Admitting: Physician Assistant

## 2020-09-24 DIAGNOSIS — L89629 Pressure ulcer of left heel, unspecified stage: Secondary | ICD-10-CM | POA: Diagnosis present

## 2020-09-24 DIAGNOSIS — Z952 Presence of prosthetic heart valve: Secondary | ICD-10-CM | POA: Insufficient documentation

## 2020-09-24 DIAGNOSIS — Z7901 Long term (current) use of anticoagulants: Secondary | ICD-10-CM | POA: Diagnosis not present

## 2020-09-24 DIAGNOSIS — E11621 Type 2 diabetes mellitus with foot ulcer: Secondary | ICD-10-CM | POA: Insufficient documentation

## 2020-09-24 DIAGNOSIS — L97812 Non-pressure chronic ulcer of other part of right lower leg with fat layer exposed: Secondary | ICD-10-CM | POA: Diagnosis not present

## 2020-09-24 DIAGNOSIS — L89623 Pressure ulcer of left heel, stage 3: Secondary | ICD-10-CM | POA: Insufficient documentation

## 2020-09-24 DIAGNOSIS — E11622 Type 2 diabetes mellitus with other skin ulcer: Secondary | ICD-10-CM | POA: Insufficient documentation

## 2020-09-24 NOTE — Progress Notes (Signed)
LEVERETT, CAMPLIN ([id number]) Visit Report for 09/24/2020 Allergy List Details Patient Name: [name], [name]. Date of Service: 09/24/2020 12:45 PM Medical Record Number: [id number] Patient Account Number: [id number] Date of Birth/Sex: Jul 25, 1946 (74 y.o. M) Treating RN: [name] Primary Care Tymel Conely: Fulton Reek Other Clinician: Referring Claryssa Sandner: Fulton Reek Treating Deshanna Kama/Extender: Skipper Cliche in Treatment: 0 Allergies Active Allergies tetracycline Reaction: hives Allergy Notes Electronic Signature(s) Signed: 09/24/2020 5:20:48 PM By: [name], BSN, RN, CWS, [name] RN, BSN Entered By: [name], BSN, RN, CWS, [name] on 09/24/2020 13:17:53 [name] ([id number]) -------------------------------------------------------------------------------- Arrival Information Details Patient Name: [name]. Date of Service: 09/24/2020 12:45 PM Medical Record Number: [id number] Patient Account Number: [id number] Date of Birth/Sex: 08-18-1946 (74 y.o. M) Treating RN: [name] Primary Care Micheale Schlack: Fulton Reek Other Clinician: Referring Norie Latendresse: Fulton Reek Treating Reese Stockman/Extender: Skipper Cliche in Treatment: 0 Visit Information Patient Arrived: [name] Time: 13:11 Accompanied By: self Transfer Assistance: None Patient Identification Verified: Yes Secondary Verification Process Completed: Yes Patient Has Alerts: Yes Patient Alerts: Type II Diabetic 81 mg Aspirin Warfarin Electronic Signature(s) Signed: 09/24/2020 5:20:48 PM By: [name], BSN, RN, CWS, [name] RN, BSN Entered By: [name], BSN, RN, CWS, [name] on 09/24/2020 13:16:23 [name] ([id number]) -------------------------------------------------------------------------------- Clinic Level of Care Assessment Details Patient Name: [name]. Date of Service: 09/24/2020 12:45 PM Medical Record Number: [id number] Patient Account Number: [id number] Date of Birth/Sex: 24-[name]-1946 (74 y.o.  M) Treating RN: [name] Primary Care Taurus Alamo: Fulton Reek Other Clinician: Referring Dina Mobley: Fulton Reek Treating Schyler Butikofer/Extender: Skipper Cliche in Treatment: 0 Clinic Level of Care Assessment Items TOOL 2 Quantity Score []  - Use when only an EandM is performed on the INITIAL visit 0 ASSESSMENTS - Nursing Assessment / Reassessment X - General Physical Exam (combine w/ comprehensive assessment (listed just below) when performed on new 1 20 pt. evals) X- 1 25 Comprehensive Assessment (HX, ROS, Risk Assessments, Wounds Hx, etc.) ASSESSMENTS - Wound and Skin Assessment / Reassessment X - Simple Wound Assessment / Reassessment - one wound 1 5 []  - 0 Complex Wound Assessment / Reassessment - multiple wounds []  - 0 Dermatologic / Skin Assessment (not related to wound area) ASSESSMENTS - Ostomy and/or Continence Assessment and Care []  - Incontinence Assessment and Management 0 []  - 0 Ostomy Care Assessment and Management (repouching, etc.) PROCESS - Coordination of Care X - Simple Patient / Family Education for ongoing care 1 15 []  - 0 Complex (extensive) Patient / Family Education for ongoing care X- 1 10 Staff obtains Programmer, systems, Records, Test Results / Process Orders []  - 0 Staff telephones HHA, Nursing Homes / Clarify orders / etc []  - 0 Routine Transfer to another Facility (non-emergent condition) []  - 0 Routine Hospital Admission (non-emergent condition) X- 1 15 New Admissions / Biomedical engineer / Ordering NPWT, Apligraf, etc. []  - 0 Emergency Hospital Admission (emergent condition) X- 1 10 Simple Discharge Coordination []  - 0 Complex (extensive) Discharge Coordination PROCESS - Special Needs []  - Pediatric / Minor Patient Management 0 []  - 0 Isolation Patient Management []  - 0 Hearing / Language / Visual special needs []  - 0 Assessment of Community assistance (transportation, D/C planning, etc.) []  - 0 Additional assistance / Altered  mentation []  - 0 Support Surface(s) Assessment (bed, cushion, seat, etc.) INTERVENTIONS - Wound Cleansing / Measurement X - Wound Imaging (photographs - any number of wounds) 1 5 []  - 0 Wound Tracing (instead of photographs) X- 1 5 Simple Wound Measurement - one wound []  -  0 Complex Wound Measurement - multiple wounds SULO, JANCZAK ([id number]) X- 1 5 Simple Wound Cleansing - one wound []  - 0 Complex Wound Cleansing - multiple wounds INTERVENTIONS - Wound Dressings []  - Small Wound Dressing one or multiple wounds 0 X- 1 15 Medium Wound Dressing one or multiple wounds []  - 0 Large Wound Dressing one or multiple wounds []  - 0 Application of Medications - injection INTERVENTIONS - Miscellaneous []  - External ear exam 0 []  - 0 Specimen Collection (cultures, biopsies, blood, body fluids, etc.) []  - 0 Specimen(s) / Culture(s) sent or taken to Lab for analysis []  - 0 Patient Transfer (multiple staff / Civil Service fast streamer / Similar devices) []  - 0 Simple Staple / Suture removal (25 or less) []  - 0 Complex Staple / Suture removal (26 or more) []  - 0 Hypo / Hyperglycemic Management (close monitor of Blood Glucose) []  - 0 Ankle / Brachial Index (ABI) - do not check if billed separately Has the patient been seen at the hospital within the last three years: Yes Total Score: 130 Level Of Care: New/Established - Level 4 Electronic Signature(s) Signed: 09/24/2020 5:20:48 PM By: [name], BSN, RN, CWS, [name] RN, BSN Entered By: [name], BSN, RN, CWS, [name] on 09/24/2020 16:14:01 [name] ([id number]) -------------------------------------------------------------------------------- Encounter Discharge Information Details Patient Name: [name]. Date of Service: 09/24/2020 12:45 PM Medical Record Number: [id number] Patient Account Number: [id number] Date of Birth/Sex: 1946-06-19 (74 y.o. M) Treating RN: [name] Primary Care Theona Muhs: Fulton Reek Other Clinician: Referring  Earnest Thalman: Fulton Reek Treating Ibrahim Mcpheeters/Extender: Skipper Cliche in Treatment: 0 Encounter Discharge Information Items Discharge Condition: Stable Ambulatory Status: Cane Discharge Destination: Home Transportation: Private Auto Accompanied By: self Schedule Follow-up Appointment: Yes Clinical Summary of Care: Electronic Signature(s) Signed: 09/24/2020 4:16:43 PM By: [name], BSN, RN, CWS, [name] RN, BSN Entered By: [name], BSN, RN, CWS, [name] on 09/24/2020 16:16:43 [name] ([id number]) -------------------------------------------------------------------------------- Lower Extremity Assessment Details Patient Name: FINNEAS, MATHE. Date of Service: 09/24/2020 12:45 PM Medical Record Number: [id number] Patient Account Number: [id number] Date of Birth/Sex: 10/12/1946 (74 y.o. M) Treating RN: [name] Primary Care Nefertari Rebman: Fulton Reek Other Clinician: Referring Addilyn Satterwhite: Fulton Reek Treating Rondo Spittler/Extender: Skipper Cliche in Treatment: 0 Edema Assessment Assessed: Shirlyn Goltz: Yes] [Right: Yes] Edema: [Left: Yes] [Right: Yes] Calf Left: Right: Point of Measurement: 37 cm From Medial Instep 40 cm 40 cm Ankle Left: Right: Point of Measurement: 13 cm From Medial Instep 23.7 cm 24.7 cm Vascular Assessment Pulses: Dorsalis Pedis Palpable: [Left:Yes] [Right:Yes] Posterior Tibial Palpable: [Left:Yes] [Right:Yes] Notes Recent visit with [facility] shows TBI of 0.88 right 0.94 left Electronic Signature(s) Signed: 09/24/2020 5:20:48 PM By: [name], BSN, RN, CWS, [name] RN, BSN Entered By: [name], BSN, RN, CWS, [name] on 09/24/2020 13:42:33 [name] ([id number]) -------------------------------------------------------------------------------- Multi Wound Chart Details Patient Name: TEMITOPE, GRIFFING. Date of Service: 09/24/2020 12:45 PM Medical Record Number: [id number] Patient Account Number: [id number] Date of Birth/Sex: 04-25-1946 (75 y.o. 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 N/A N/A Comorbid History: Cataracts, Arrhythmia, Coronary N/A N/A Artery Disease, Hypertension, Type II Diabetes, Osteoarthritis, Neuropathy Date Acquired: 09/24/1998  N/A N/A Weeks of Treatment: 0 N/A N/A Wound Status: Open N/A N/A Measurements L x W x D (cm) 3.5x1.5x0.1 N/A N/A Area (cm) : 4.123 N/A N/A Volume (cm) : 0.412 N/A N/A Classification: Grade 2 N/A N/A Exudate Amount: Medium N/A N/A Exudate Type: Serous N/A N/A Exudate Color: amber N/A N/A Wound Margin: Flat and Intact N/A N/A Granulation Amount: Medium (34-66%) N/A N/A Necrotic Amount: Medium (34-66%) N/A N/A Necrotic Tissue: Eschar, Adherent Slough N/A N/A Exposed Structures: Fat Layer (Subcutaneous Tissue): N/A N/A Yes Fascia: No Tendon: No Muscle: No Joint: No Bone: No Treatment Notes Electronic Signature(s) Signed: 09/24/2020 5:20:48 PM By: [name], BSN, RN, CWS, [name] RN, BSN Entered By: [name], BSN, RN, CWS, [name] on 09/24/2020 14:02:50 [name] ([id number]) -------------------------------------------------------------------------------- [place] Details Patient Name: MAURISIO, RUDDY. Date of Service: 09/24/2020 12:45 PM Medical Record Number: [id number] Patient Account Number: [id number] Date of Birth/Sex: 05-12-1946 (74 y.o. M) Treating RN: [name] Primary Care Zoejane Gaulin: Fulton Reek Other Clinician: Referring Krystol Rocco: Fulton Reek Treating Jordan [name]/Extender: Skipper Cliche in Treatment: 0 Active Inactive Orientation to the Wound Care Program Nursing Diagnoses: Knowledge deficit related to the wound healing center program Goals: Patient/caregiver will verbalize understanding of the [place] Date Initiated: 09/24/2020 Target Resolution Date: 09/24/2020 Goal Status: Active Interventions: Provide education on orientation to the wound center Notes: Peripheral Neuropathy Nursing Diagnoses: Knowledge deficit related to disease process and management of peripheral neurovascular dysfunction Potential alteration in peripheral tissue perfusion (select prior to confirmation of diagnosis) Goals: Patient/caregiver will verbalize understanding of disease process and disease management Date Initiated: 09/24/2020 Target Resolution Date: 10/22/2020 Goal Status: Active Interventions: Assess signs and symptoms of neuropathy upon admission and as needed Provide education on Management of Neuropathy and Related Ulcers Notes: Pressure Nursing Diagnoses: Knowledge deficit related to causes and risk factors for pressure ulcer development Knowledge deficit related to management of pressures ulcers Goals: Patient will remain free from development of additional pressure ulcers Date Initiated: 09/24/2020 Target Resolution Date: 10/23/2020 Goal Status: Active Patient will remain free of pressure ulcers Date Initiated: 09/24/2020 Target Resolution Date: 10/22/2020 Goal Status: Active Interventions: Assess: immobility, friction, shearing, incontinence upon admission and as needed Assess offloading mechanisms upon admission and as needed Notes: Wound/Skin Impairment RUPERT, AZZARA ([id number]) Nursing Diagnoses: Impaired tissue integrity Goals: Ulcer/skin breakdown will have a volume reduction of 30% by week 4 Date Initiated: 09/24/2020 Target Resolution Date: 10/25/2020 Goal Status: Active Ulcer/skin  breakdown will have a volume reduction of 50% by week 8 Date Initiated: 09/24/2020 Target Resolution Date: 11/25/2020 Goal Status: Active Ulcer/skin breakdown will have a volume reduction of 80% by week 12 Date Initiated: 09/24/2020 Target Resolution Date: 12/23/2020 Goal Status: Active Ulcer/skin breakdown will heal within 14 weeks Date Initiated: 09/24/2020 Target Resolution Date: 01/13/2021 Goal Status: Active Interventions: Assess patient/caregiver ability to obtain necessary supplies Assess patient/caregiver ability to perform ulcer/skin care regimen upon admission and as needed Treatment Activities: Referred to DME Kyle Stansell for dressing supplies : 09/24/2020 Skin care regimen initiated : 09/24/2020 Topical wound management initiated : 09/24/2020 Notes: Electronic Signature(s) Signed: 09/24/2020 5:20:48 PM By: [name], BSN, RN, CWS, [name] RN, BSN Entered By: [name], BSN, RN, CWS, [name] on 09/24/2020 14:02:32 KONSTANTIN, LEHNEN ([id number]) -------------------------------------------------------------------------------- Pain Assessment Details Patient Name: BERTRUM, HELMSTETTER. Date of Service: 09/24/2020 12:45 PM Medical Record Number: [id number] Patient Account Number: [id number] Date of Birth/Sex: Apr 23, 1946 (74 y.o. M) Treating RN: [name] Primary Care Dionysios Massman: Fulton Reek Other Clinician: Referring Laurelai Lepp: Doy Hutching,  Dellis Filbert Treating Priscila Bean/Extender: Jeri Cos Weeks in Treatment: 0 Active Problems Location of Pain Severity and Description of Pain Patient Has Paino No Site Locations Pain Management and Medication Current Pain Management: Notes Patient denies pain at this time. Electronic Signature(s) Signed: 09/24/2020 5:20:48 PM By: [name], BSN, RN, CWS, [name] RN, BSN Entered By: [name], BSN, RN, CWS, [name] on 09/24/2020 13:16:43 [name] ([id number]) -------------------------------------------------------------------------------- Patient/Caregiver Education  Details Patient Name: JAMERSON, VONBARGEN. Date of Service: 09/24/2020 12:45 PM Medical Record Number: [id number] Patient Account Number: [id number] Date of Birth/Gender: Feb 17, 1946 (74 y.o. M) Treating RN: [name] Primary Care Physician: Fulton Reek Other Clinician: Referring Physician: Fulton Reek Treating Physician/Extender: Skipper Cliche in Treatment: 0 Education Assessment Education Provided To: Patient Education Topics Provided Peripheral Neuropathy: Handouts: Diabetes, General Foot Care, Neuropathy, Other: do not walk without shoes Methods: Demonstration, Explain/Verbal Responses: State content correctly Welcome To [facility] [place]: Handouts: Welcome To [facility] [place] Methods: Demonstration, Explain/Verbal Responses: State content correctly Wound/Skin Impairment: Handouts: Caring for Your Ulcer, Other: wound care as prescribed Methods: Demonstration, Explain/Verbal Responses: State content correctly Electronic Signature(s) Signed: 09/24/2020 5:20:48 PM By: [name], BSN, RN, CWS, [name] RN, BSN Entered By: [name], BSN, RN, CWS, [name] on 09/24/2020 16:15:28 [name] ([id number]) -------------------------------------------------------------------------------- Wound Assessment Details Patient Name: SHISHIR, KRANTZ. Date of Service: 09/24/2020 12:45 PM Medical Record Number: [id number] Patient Account Number: [id number] Date of Birth/Sex: 1946-07-11 (74 y.o. M) Treating RN: [name] Primary Care Tyann Niehaus: Fulton Reek Other Clinician: Referring Sascha Baugher: Fulton Reek Treating Liliya Fullenwider/Extender: Skipper Cliche in Treatment: 0 Wound Status Wound Number: 1 Primary Diabetic Wound/Ulcer of the Lower Extremity Etiology: Wound Location: Right Calcaneus Secondary Pressure Ulcer Wounding Event: Gradually Appeared Etiology: Date Acquired: 09/24/1998 Wound Open Weeks Of Treatment: 0 Status: Clustered Wound: No Comorbid Cataracts,  Arrhythmia, Coronary Artery Disease, History: Hypertension, Type II Diabetes, Osteoarthritis, Neuropathy Photos Wound Measurements Length: (cm) 3.5 Width: (cm) 1.5 Depth: (cm) 0.1 Area: (cm) 4.123 Volume: (cm) 0.412 % Reduction in Area: % Reduction in Volume: Tunneling: No Undermining: No Wound Description Classification: Grade 2 Wound Margin: Flat and Intact Exudate Amount: Medium Exudate Type: Serous Exudate Color: amber Foul Odor After Cleansing: No Slough/Fibrino Yes Wound Bed Granulation Amount: Medium (34-66%) Exposed Structure Necrotic Amount: Medium (34-66%) Fascia Exposed: No Necrotic Quality: Eschar, Adherent Slough Fat Layer (Subcutaneous Tissue) Exposed: Yes Tendon Exposed: No Muscle Exposed: No Joint Exposed: No Bone Exposed: No Electronic Signature(s) Signed: 09/24/2020 5:20:48 PM By: [name], BSN, RN, CWS, [name] RN, BSN Entered By: [name], BSN, RN, CWS, [name] on 09/24/2020 13:35:43 [name] ([id number]) -------------------------------------------------------------------------------- [place] Details Patient Name: CHRISTIN, MCCREEDY. Date of Service: 09/24/2020 12:45 PM Medical Record Number: [id number] Patient Account Number: [id number] Date of Birth/Sex: July 14, 1946 (74 y.o. M) Treating RN: [name] Primary Care Aman Bonet: Fulton Reek Other Clinician: Referring Chaunte Hornbeck: Fulton Reek Treating Graysen Woodyard/Extender: Skipper Cliche in Treatment: 0 Vital Signs Time Taken: 13:16 Temperature (F): 98.4 Height (in): 70 Pulse (bpm): 71 Weight (lbs): 262 Respiratory Rate (breaths/min): 18 Body Mass Index (BMI): 37.6 Blood Pressure (mmHg): 143/55 Reference Range: 80 - 120 mg / dl Electronic Signature(s) Signed: 09/24/2020 5:20:48 PM By: [name], BSN, RN, CWS, [name] RN, BSN Entered By: [name], BSN, RN, CWS, [name] on 09/24/2020 13:17:23

## 2020-09-24 NOTE — Progress Notes (Signed)
CHAMP, KEETCH ([id number]) Visit Report for 09/24/2020 Chief Complaint Document Details Patient Name: [name], [name]. Date of Service: 09/24/2020 12:45 PM Medical Record Number: [id number] Patient Account Number: [id number] Date of Birth/Sex: [birth year] (74 y.o. M) Treating RN: [name] Primary Care Provider: Fulton Reek Other Clinician: Referring Provider: Fulton Reek Treating Provider/Extender: Skipper Cliche in Treatment: 0 Information Obtained from: Patient Chief Complaint Left heel pressure ulcer Electronic Signature(s) Signed: 09/24/2020 1:50:30 PM By: Worthy Keeler PA-C Entered By: Worthy Keeler on 09/24/2020 13:50:30 [name] ([id number]) -------------------------------------------------------------------------------- HPI Details Patient Name: [name]. Date of Service: 09/24/2020 12:45 PM Medical Record Number: [id number] Patient Account Number: [id number] Date of Birth/Sex: 01/09/1946 (74 y.o. M) Treating RN: [name] Primary Care Provider: Fulton Reek Other Clinician: Referring Provider: Fulton Reek Treating Provider/Extender: Skipper Cliche in Treatment: 0 History of Present Illness HPI Description: 09/24/2020 on evaluation today patient presents today for a heel ulcer that he tells me has been present for about 2 years. He has been seeing podiatry and they have been attempting to manage this including what sounds to be a total contact cast, Unna boot, and just standard dressings otherwise as well. Most recently has been using triple antibiotic ointment. With that being said unfortunately despite everything he really has not had any significant improvement. He tells me that he cannot even really remember exactly how this began but he presumed it may have rubbed on his shoes or something of that nature. 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 He tells me that this does seem to help. Fortunately there is no signs of active infection at this time. His most recent hemoglobin A1c was 8.1 though he knows this was this year he cannot tell me the exact time. His fluid pills currently to help with some of the lower extremity edema although he does obviously have signs of venous stasis/lymphedema. Currently there is no evidence of active infection. No fevers, chills, nausea, vomiting, or diarrhea. Patient has had fairly recent ABIs which were performed on 07/19/2020 and revealed that he has normal findings in both the ankle and toe locations bilaterally. His ABI on the right was 1.09 on the left was 1.08 with a TBI on the right of 0.88 and on the left of 0.94. Triphasic flow was noted throughout. Electronic Signature(s) Signed: 09/24/2020 5:20:05 PM By: Worthy Keeler PA-C Previous Signature: 09/24/2020 5:18:20 PM Version By: Worthy Keeler PA-C Entered By: Worthy Keeler on 09/24/2020 17:20:05 [name] ([id number]) -------------------------------------------------------------------------------- Physical Exam Details Patient Name: [name] Date of Service: 09/24/2020 12:45 PM Medical Record Number: [id number] Patient Account Number: [id number] Date of Birth/Sex: Dec 19, 1945 (74 y.o. M) Treating RN: [name] Primary Care Provider: Fulton Reek Other Clinician: Referring Provider: Fulton Reek Treating Provider/Extender: Skipper Cliche in Treatment: 0 Constitutional patient is hypertensive.. pulse regular and within target range for patient.Marland Kitchen respirations regular, non-labored and within target range for patient.Marland Kitchen temperature within target range for patient.. Well-nourished and well-hydrated in no acute  distress. Eyes conjunctiva clear no eyelid edema noted. pupils equal round and reactive to light and accommodation. Ears, Nose, Mouth, and Throat no gross abnormality of ear auricles or external auditory  canals. normal hearing noted during conversation. mucus membranes moist. Respiratory normal breathing without difficulty. Cardiovascular 2+ dorsalis pedis/posterior tibialis pulses. no clubbing, cyanosis, significant edema, <3 sec cap refill. Musculoskeletal normal gait and posture. no significant deformity or arthritic changes, no loss or range of motion, no clubbing. Psychiatric this patient is able to make decisions and demonstrates good insight into disease process. Alert and Oriented x 3. pleasant and cooperative. Notes Upon inspection patient's heel wound actually does not appear to be that bad. There is no sign of active infection and in general I am very pleased with the overall appearance today. With that being said obviously the fact this has been going on for such a long time does have me a little bit more concerned for that reason I am going obtain an x-ray just to make sure there is nothing grossly abnormal that would indicate we need to be more concerned or look into this further such as an MRI or someone is concerned. Electronic Signature(s) Signed: 09/24/2020 5:18:55 PM By: Worthy Keeler PA-C Entered By: Worthy Keeler on 09/24/2020 17:18:55 [name] ([id number]) -------------------------------------------------------------------------------- Physician Orders Details Patient Name: [name]. Date of Service: 09/24/2020 12:45 PM Medical Record Number: [id number] Patient Account Number: [id number] Date of Birth/Sex: [birth year] (74 y.o. 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 Primary Wound Dressing Wound #1 Left Calcaneus o Silver Collagen Secondary Dressing Wound #1 Left Calcaneus o ABD pad - secured with stretch net Dressing Change Frequency Wound #1 Left Calcaneus o Change Dressing Tuesday and Thursday. Follow-up Appointments Wound #1 Left Calcaneus o Return Appointment in 1 week. Edema Control Wound #1 Left Calcaneus o Elevate legs to the level of the heart and pump ankles as often as possible Off-Loading Wound #1 Left Calcaneus o Other: - heel off-loader Additional Orders / Instructions Wound #1 Left Calcaneus o Increase protein intake. Radiology o X-ray, heel - Left heel EULON, ALLNUTT ([id number]) Electronic Signature(s) Signed: 09/24/2020 5:20:48 PM By: [name], BSN, RN, CWS, [name] RN, BSN Signed: 09/24/2020 5:22:05 PM By: Worthy Keeler PA-C Entered By: [name], BSN, RN, CWS, [name] on 09/24/2020 16:12:42 RODGERICK, GILLIAND ([id number]) -------------------------------------------------------------------------------- Prescription 09/24/2020 Patient Name: [name] Provider: Jeri Cos PA-C Date of Birth: 1946-05-21 NPI#: [id number] Sex: Jerilynn Mages DEA#: [id number] Phone #: [phone] License #: Patient Address: [facility] [place], [place] [address] [address] [name],  [address] [phone] Allergies tetracycline Provider's  Orders o X-ray, heel - Left heel Hand Signature: Date(s): Electronic Signature(s) Signed: 09/24/2020 5:20:48 PM By: [name], BSN, RN, CWS, [name] RN, BSN Signed: 09/24/2020 5:22:05 PM By: Worthy Keeler PA-C Entered By: [name], BSN, RN, CWS, [name] on 09/24/2020 16:12:43 [name] ([id number]) --------------------------------------------------------------------------------  Problem List Details Patient Name: FERAS, GARDELLA. Date of Service: 09/24/2020 12:45 PM Medical Record Number: [id number] Patient Account Number: [id number]  Date of Birth/Sex: 04/04/1946 (74 y.o. 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 Date of Service: 09/24/2020 12:45 PM Medical Record Number: [id number] Patient Account Number: [id number] Date of Birth/Sex: [birth year] (74 y.o. M) Treating RN: [name] Primary Care Provider: Fulton Reek Other Clinician: Referring Provider: Fulton Reek Treating Provider/Extender: Skipper Cliche in Treatment: 0 Subjective Chief Complaint Information obtained from Patient Left heel pressure ulcer History of Present Illness (HPI) 09/24/2020 on evaluation today  patient presents today for a heel ulcer that he tells me has been present for about 2 years. He has been seeing podiatry and they have been attempting to manage this including what sounds to be a total contact cast, Unna boot, and just standard dressings otherwise as well. Most recently has been using triple antibiotic ointment. With that being said unfortunately despite everything he really has not had any significant improvement. He tells me that he cannot even really remember exactly how this began but he presumed it may have rubbed on his shoes or something of that nature. 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 He tells me that this does seem to help. Fortunately there is no signs of active infection at this time. His most recent hemoglobin A1c was 8.1 though he knows this was this year he cannot tell me the exact time. His fluid pills currently to help with some of the lower extremity edema although he does obviously have signs of venous stasis/lymphedema. Currently there is no evidence of active infection. No fevers, chills, nausea, vomiting, or diarrhea. Patient has had fairly recent ABIs which were performed on 07/19/2020 and revealed that he has normal findings in both the ankle and toe locations bilaterally. His ABI on the right was 1.09 on the left was 1.08 with a TBI on the right of 0.88 and on the left of 0.94. Triphasic flow was noted throughout. Patient History Information obtained from Patient. Allergies tetracycline (Reaction: hives) Social History Former smoker, Marital Status - Married, Alcohol Use - Rarely, Drug Use - No History, Caffeine Use - Daily. Medical History Eyes Patient has history of  Cataracts - removed Cardiovascular Patient has history of Arrhythmia, Coronary Artery Disease - valve replacement, Hypertension - medication Endocrine Patient has history of Type II Diabetes Musculoskeletal Patient has history of Osteoarthritis - hands, right knee Neurologic Patient has history of Neuropathy - feet Patient is treated with Oral Agents. Review of Systems (ROS) Constitutional Symptoms (General Health) Denies complaints or symptoms of Fatigue, Fever, Chills, Marked Weight Change. Eyes Complains or has symptoms of Vision Changes - readers. Ear/Nose/Mouth/Throat Denies complaints or symptoms of Difficult clearing ears,  Sinusitis. Hematologic/Lymphatic Denies complaints or symptoms of Bleeding / Clotting Disorders, Human Immunodeficiency Virus. Respiratory Denies complaints or symptoms of Chronic or frequent coughs, Shortness of Breath. Cardiovascular Complains or has symptoms of LE edema. Gastrointestinal Denies complaints or symptoms of Frequent diarrhea, Nausea, Vomiting. Endocrine Complains or has symptoms of Thyroid disease. Genitourinary Denies complaints or symptoms of Kidney failure/ Dialysis, Incontinence/dribbling. Immunological Denies complaints or symptoms of Hives, Itching. Integumentary (Skin) ENOS, MUHL ([id number]) Complains or has symptoms of Wounds, Bleeding or bruising tendency. Musculoskeletal Denies complaints or symptoms of Muscle Pain, Muscle Weakness. Psychiatric Denies complaints or symptoms of Anxiety, Claustrophobia. Objective Constitutional patient is hypertensive.. pulse regular and within target range for patient.Marland Kitchen respirations regular, non-labored and within target range for patient.Marland Kitchen temperature within target range for patient.. Well-nourished and well-hydrated in no acute distress. Vitals Time Taken: 1:16 PM, Height: 70 in, Weight: 262 lbs, BMI: 37.6, Temperature: 98.4 F, Pulse: 71 bpm, Respiratory Rate: 18  breaths/min, Blood Pressure: 143/55 mmHg. Eyes conjunctiva clear no eyelid edema noted. pupils equal round and reactive to light and accommodation. Ears, Nose, Mouth, and Throat no gross abnormality of ear auricles or external auditory canals. normal hearing noted during conversation. mucus membranes moist. Respiratory normal breathing without difficulty. Cardiovascular 2+ dorsalis pedis/posterior tibialis pulses. no clubbing, cyanosis, significant edema, Musculoskeletal normal gait and posture. no significant deformity or arthritic changes, no loss or range of motion, no clubbing. Psychiatric this patient is able to make decisions and demonstrates good insight into disease process. Alert and Oriented x 3. pleasant and cooperative. General Notes: Upon inspection patient's heel wound actually does not appear to be that bad. There is no sign of active infection and in general I am very pleased with the overall appearance today. With that being said obviously the fact this has been going on for such a long time does have me a little bit more concerned for that reason I am going obtain an x-ray just to make sure there is nothing grossly abnormal that would indicate we need to be more concerned or look into this further such as an MRI or someone is concerned. Integumentary (Hair, Skin) Wound #1 status is Open. Original cause of wound was Gradually Appeared. The wound is located on the Left Calcaneus. The wound measures 3.5cm length x 1.5cm width x 0.1cm depth; 4.123cm^2 area and 0.412cm^3 volume. There is Fat Layer (Subcutaneous Tissue) exposed. There is no tunneling or undermining noted. There is a medium amount of serous drainage noted. The wound margin is flat and intact. There is medium (34-66%) granulation within the wound bed. There is a medium (34-66%) amount of necrotic tissue within the wound bed including Eschar and Adherent Slough. 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 Primary Wound Dressing: Wound #1 Left Calcaneus: Silver Collagen Secondary Dressing: Wound #1 Left Calcaneus: ABD pad - secured with stretch net Dressing Change Frequency: Wound #1 Left Calcaneus: Change Dressing Tuesday and Thursday. Follow-up Appointments: Wound #1 Left Calcaneus: Return Appointment in 1 week. Edema Control: Wound #1 Left Calcaneus: Elevate legs to the level of the heart and pump ankles as often  as possible Off-Loading: Wound #1 Left Calcaneus: Other: - heel off-loader Additional Orders / Instructions: Wound #1 Left Calcaneus: Increase protein intake. Radiology ordered were: X-ray, heel - Left heel 1. I would recommend at this time that we have the patient go ahead and begin treatment with a silver collagen dressing I think this is a good option based on what I am seeing and does not appear to be this wound is too deep which is good news. 2. Stroke I recommend that we have the patient utilize his heel offloading shoe he actually has 1 of these at home already that he will put on when he gets home to wear. He also has a Prevalon offloading boot for when he is in bed at night which is also excellent news. Overall I think he has everything in place that he needs to see his heel we just need the right combination of dressings likely. 3. With regard to the fact this has been going on as long as I have I am going to perform an x-ray today just to evaluate and make sure were not missing anything obvious that may be  contributing to the lack of healing progress. The patient is in agreement that plan he is going for that hopefully later today we will see him with the results of that show. We will see patient back for reevaluation in 1 week here in the clinic. If anything worsens or changes patient will contact our office for additional recommendations. Electronic Signature(s) Signed: 09/24/2020 5:21:24 PM By: Worthy Keeler PA-C Entered By: Worthy Keeler on 09/24/2020 17:21:24 BRECK, MARYLAND ([id number]) -------------------------------------------------------------------------------- ROS/PFSH Details Patient Name: KAZUKI, INGLE. Date of Service: 09/24/2020 12:45 PM Medical Record Number: [id number] Patient Account Number: [id number] Date of Birth/Sex: 1945/12/18 (74 y.o. M) Treating RN: [name] Primary Care Provider: Fulton Reek Other Clinician: Referring Provider: Fulton Reek Treating Provider/Extender: Skipper Cliche in Treatment: 0 Information Obtained From Patient Constitutional Symptoms (General Health) Complaints and Symptoms: Negative for: Fatigue; Fever; Chills; Marked Weight Change Eyes Complaints and Symptoms: Positive for: Vision Changes - readers Medical History: Positive for: Cataracts - removed Ear/Nose/Mouth/Throat Complaints and Symptoms: Negative for: Difficult clearing ears; Sinusitis Hematologic/Lymphatic Complaints and Symptoms: Negative for: Bleeding / Clotting Disorders; Human Immunodeficiency Virus Respiratory Complaints and Symptoms: Negative for: Chronic or frequent coughs; Shortness of Breath Cardiovascular Complaints and Symptoms: Positive for: LE edema Medical History: Positive for: Arrhythmia; Coronary Artery Disease - valve replacement; Hypertension - medication Gastrointestinal Complaints and Symptoms: Negative for: Frequent diarrhea; Nausea; Vomiting Endocrine Complaints and Symptoms: Positive for: Thyroid disease Medical  History: Positive for: Type II Diabetes Time with diabetes: 10 years Treated with: Oral agents Genitourinary Complaints and Symptoms: Negative for: Kidney failure/ Dialysis; Incontinence/dribbling LODEN, LAURENT ([id number]) Immunological Complaints and Symptoms: Negative for: Hives; Itching Integumentary (Skin) Complaints and Symptoms: Positive for: Wounds; Bleeding or bruising tendency Musculoskeletal Complaints and Symptoms: Negative for: Muscle Pain; Muscle Weakness Medical History: Positive for: Osteoarthritis - hands, right knee Psychiatric Complaints and Symptoms: Negative for: Anxiety; Claustrophobia Neurologic Medical History: Positive for: Neuropathy - feet Oncologic HBO Extended History Items Eyes: Cataracts Immunizations Pneumococcal Vaccine: Received Pneumococcal Vaccination: No Implantable Devices None Family and Social History Former smoker; Marital Status - Married; Alcohol Use: Rarely; Drug Use: No History; Caffeine Use: Daily Electronic Signature(s) Signed: 09/24/2020 5:20:48 PM By: [name], BSN, RN, CWS, [name] RN, BSN Signed: 09/24/2020 5:22:05 PM By: Worthy Keeler PA-C Entered By: [name], BSN, RN, CWS, [name] on 09/24/2020 13:23:48 Anthony,  AUGUSTINO SAVASTANO ([id number]) -------------------------------------------------------------------------------- SuperBill Details Patient Name: HAGAN, VANAUKEN. Date of Service: 09/24/2020 Medical Record Number: [id number] Patient Account Number: [id number] Date of Birth/Sex: 02/09/1946 (74 y.o. 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, stage 3 Z95.4 Presence of other heart-valve replacement Z79.01 Long term (current) use of anticoagulants Facility  Procedures CPT4 Code: [id number] Description: 99214 - WOUND CARE VISIT-LEV 4 EST PT Modifier: Quantity: 1 Physician Procedures CPT4 Code: 6389373 Description: 42876 - WC PHYS LEVEL 4 - NEW PT Modifier: Quantity: 1 CPT4 Code: Description: ICD-10 Diagnosis Description 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

## 2020-09-24 NOTE — Progress Notes (Signed)
[name] ([id number]) Visit Report for 09/24/2020 Abuse/Suicide Risk Screen Details Patient Name: [name], [name]. Date of Service: 09/24/2020 12:45 PM Medical Record Number: [id number] Patient Account Number: [id number] Date of Birth/Sex: Nov 17, 1945 (74 y.o. M) Treating RN: [name] Primary Care Matyas Baisley: Fulton Reek Other Clinician: Referring Kamil Hanigan: Fulton Reek Treating Zahli Vetsch/Extender: Skipper Cliche in Treatment: 0 Abuse/Suicide Risk Screen Items Answer ABUSE RISK SCREEN: Has anyone close to you tried to hurt or harm you recentlyo No Do you feel uncomfortable with anyone in your familyo No Has anyone forced you do things that you didnot want to doo No Electronic Signature(s) Signed: 09/24/2020 5:20:48 PM By: [name], BSN, RN, CWS, [name] RN, BSN Entered By: [name], BSN, RN, CWS, [name] on 09/24/2020 13:24:02 [name] ([id number]) -------------------------------------------------------------------------------- Activities of Daily Living Details Patient Name: [name]. Date of Service: 09/24/2020 12:45 PM Medical Record Number: [id number] Patient Account Number: [id number] Date of Birth/Sex: 22-Oct-1945 (74 y.o. M) Treating RN: [name] Primary Care Kingsly Kloepfer: Fulton Reek Other Clinician: Referring Earnestine Shipp: Fulton Reek Treating Laurena Valko/Extender: Skipper Cliche in Treatment: 0 Activities of Daily Living Items Answer Activities of Daily Living (Please select one for each item) Drive Automobile Completely Able Take Medications Completely Able Use Telephone Completely Able Care for Appearance Completely Able Use Toilet Completely Able Bath / Shower Completely Able Dress Self Completely Able Feed Self Completely Able Walk Completely Able Get In / Out Bed Completely Able Housework Completely Able Prepare Meals Completely [place] Completely Able Shop for Self Completely Able Electronic Signature(s) Signed: 09/24/2020  5:20:48 PM By: [name], BSN, RN, CWS, [name] RN, BSN Entered By: [name], BSN, RN, CWS, [name] on 09/24/2020 13:24:19 [name] ([id number]) -------------------------------------------------------------------------------- Education Screening Details Patient Name: [name]. Date of Service: 09/24/2020 12:45 PM Medical Record Number: [id number] Patient Account Number: [id number] Date of Birth/Sex: July 31, 1946 (74 y.o. M) Treating RN: [name] Primary Care Darrin Koman: Fulton Reek Other Clinician: Referring Janiene Aarons: Fulton Reek Treating Melitta Tigue/Extender: Skipper Cliche in Treatment: 0 Primary Learner Assessed: Patient Learning Preferences/Education Level/Primary Language Learning Preference: Explanation, Demonstration Highest Education Level: College or Above Preferred Language: English Cognitive Barrier Language Barrier: No Translator Needed: No Memory Deficit: No Emotional Barrier: No Cultural/Religious Beliefs Affecting Medical Care: No Physical Barrier Impaired Vision: No Impaired Hearing: No Decreased Hand dexterity: No Knowledge/Comprehension Knowledge Level: High Comprehension Level: High Ability to understand written instructions: High Ability to understand verbal instructions: High Motivation Anxiety Level: Calm Cooperation: Cooperative Education Importance: Acknowledges Need Interest in Health Problems: Asks Questions Perception: Coherent Willingness to Engage in Self-Management High Activities: Readiness to Engage in Self-Management High Activities: Engineer, maintenance) Signed: 09/24/2020 5:20:48 PM By: [name], BSN, RN, CWS, [name] RN, BSN Entered By: [name], BSN, RN, CWS, [name] on 09/24/2020 13:24:48 [name] ([id number]) -------------------------------------------------------------------------------- Fall Risk Assessment Details Patient Name: [name]. Date of Service: 09/24/2020 12:45 PM Medical Record Number:  [id number] Patient Account Number: [id number] Date of Birth/Sex: 07-Nov-1945 (74 y.o. M) Treating RN: [name] Primary Care Elihue Ebert: Fulton Reek Other Clinician: Referring Ramil Edgington: Fulton Reek Treating Frederick Klinger/Extender: Skipper Cliche in Treatment: 0 Fall Risk Assessment Items Have you had 2 or more falls in the last 12 monthso 0 Yes Have you had any fall that resulted in injury in the last 12 monthso 0 Yes FALLS RISK SCREEN History of falling - immediate or within 3 months 25 Yes Secondary diagnosis (Do you have 2 or more medical diagnoseso) 15 Yes Ambulatory aid None/bed rest/wheelchair/nurse 0 No Crutches/cane/walker  15 Yes Furniture 0 No Intravenous therapy Access/Saline/Heparin Lock 0 No Gait/Transferring Normal/ bed rest/ wheelchair 0 No Weak (short steps with or without shuffle, stooped but able to lift head while walking, may 0 No seek support from furniture) Impaired (short steps with shuffle, may have difficulty arising from chair, head down, impaired 20 Yes balance) Mental Status Oriented to own ability 0 No Electronic Signature(s) Signed: 09/24/2020 5:20:48 PM By: [name], BSN, RN, CWS, [name] RN, BSN Entered By: [name], BSN, RN, CWS, [name] on 09/24/2020 13:25:36 [name] ([id number]) -------------------------------------------------------------------------------- Foot Assessment Details Patient Name: [name]. Date of Service: 09/24/2020 12:45 PM Medical Record Number: [id number] Patient Account Number: [id number] Date of Birth/Sex: July 25, 1946 (74 y.o. M) Treating RN: [name] Primary Care Sheldon Amara: Fulton Reek Other Clinician: Referring Chole Driver: Fulton Reek Treating Chares Slaymaker/Extender: Skipper Cliche in Treatment: 0 Foot Assessment Items Site Locations + = Sensation present, - = Sensation absent, C = Callus, U = Ulcer R = Redness, W = Warmth, M = Maceration, PU = Pre-ulcerative lesion F = Fissure, S = Swelling, D =  Dryness Assessment Right: Left: Other Deformity: No No Prior Foot Ulcer: No No Prior Amputation: No No Charcot Joint: No No Ambulatory Status: Ambulatory With Help Assistance Device: Cane GaitEnergy manager) Signed: 09/24/2020 5:20:48 PM By: [name], BSN, RN, CWS, [name] RN, BSN Entered By: [name], BSN, RN, CWS, [name] on 09/24/2020 13:30:59 [name] ([id number]) -------------------------------------------------------------------------------- Nutrition Risk Screening Details Patient Name: LEVIATHAN, MACERA. Date of Service: 09/24/2020 12:45 PM Medical Record Number: [id number] Patient Account Number: [id number] Date of Birth/Sex: 23-Jan-1946 (74 y.o. M) Treating RN: [name] Primary Care Elecia Serafin: Fulton Reek Other Clinician: Referring Delanie Tirrell: Fulton Reek Treating Ebbie Cherry/Extender: Skipper Cliche in Treatment: 0 Height (in): 70 Weight (lbs): 262 Body Mass Index (BMI): 37.6 Nutrition Risk Screening Items Score Screening NUTRITION RISK SCREEN: I have an illness or condition that made me change the kind and/or amount of food I eat 0 No I eat fewer than two meals per day 0 No I eat few fruits and vegetables, or milk products 0 No I have three or more drinks of beer, liquor or wine almost every day 0 No I have tooth or mouth problems that make it hard for me to eat 0 No I don't always have enough money to buy the food I need 0 No I eat alone most of the time 0 No I take three or more different prescribed or over-the-counter drugs a day 1 Yes Without wanting to, I have lost or gained 10 pounds in the last six months 2 Yes I am not always physically able to shop, cook and/or feed myself 0 No Nutrition Protocols Good Risk Protocol 0 No interventions needed Moderate Risk Protocol High Risk Proctocol Risk Level: Moderate Risk Score: 3 Electronic Signature(s) Signed: 09/24/2020 5:20:48 PM By: [name], BSN, RN, CWS, [name] RN, BSN Entered By: [name], BSN, RN,  CWS, [name] on 09/24/2020 13:26:13

## 2020-09-25 ENCOUNTER — Ambulatory Visit
Admission: RE | Admit: 2020-09-25 | Discharge: 2020-09-25 | Disposition: A | Discharge status: Home / Self Care | Payer: PPO | Source: Ambulatory Visit | Attending: Physician Assistant | Admitting: Physician Assistant

## 2020-09-25 ENCOUNTER — Other Ambulatory Visit: Payer: Self-pay | Admitting: Physician Assistant

## 2020-09-25 DIAGNOSIS — L97529 Non-pressure chronic ulcer of other part of left foot with unspecified severity: Secondary | ICD-10-CM | POA: Diagnosis not present

## 2020-09-25 DIAGNOSIS — S81802A Unspecified open wound, left lower leg, initial encounter: Secondary | ICD-10-CM

## 2020-10-07 DIAGNOSIS — Z952 Presence of prosthetic heart valve: Secondary | ICD-10-CM | POA: Diagnosis not present

## 2020-10-08 ENCOUNTER — Encounter: Payer: PPO | Admitting: Physician Assistant

## 2020-10-08 ENCOUNTER — Other Ambulatory Visit: Payer: Self-pay

## 2020-10-08 DIAGNOSIS — E11621 Type 2 diabetes mellitus with foot ulcer: Secondary | ICD-10-CM | POA: Diagnosis not present

## 2020-10-08 NOTE — Progress Notes (Addendum)
ORVEL, CUTSFORTH ([id number]) Visit Report for 10/08/2020 Chief Complaint Document Details Patient Name: [name], [name]. Date of Service: 10/08/2020 9:00 AM Medical Record Number: [id number] Patient Account Number: [id number] Date of Birth/Sex: 31-May-1946 (74 y.o. M) Treating RN: [name] Primary Care Provider: Fulton Reek Other Clinician: Referring Provider: Fulton Reek Treating Provider/Extender: Skipper Cliche in Treatment: 2 Information Obtained from: Patient Chief Complaint Left heel pressure ulcer and Right LE traumatic ulcers Electronic Signature(s) Signed: 10/08/2020 8:56:53 AM By: Worthy Keeler PA-C Previous Signature: 10/08/2020 8:39:43 AM Version By: Worthy Keeler PA-C Entered By: Worthy Keeler on 10/08/2020 08:56:52 [name], [name] ([id number]) -------------------------------------------------------------------------------- HPI Details Patient Name: [name], [name]. Date of Service: 10/08/2020 9:00 AM Medical Record Number: [id number] Patient Account Number: [id number] Date of Birth/Sex: [birth year] (74 y.o. M) Treating RN: [name] Primary Care Provider: Fulton Reek Other Clinician: Referring Provider: Fulton Reek Treating Provider/Extender: Skipper Cliche in Treatment: 2 History of Present Illness HPI Description: 09/24/2020 on evaluation today patient presents today for a heel ulcer that he tells me has been present for about 2 years. He has been seeing podiatry and they have been attempting to manage this including what sounds to be a total contact cast, Unna boot, and just standard dressings otherwise as well. Most recently has been using triple antibiotic ointment. With that being said unfortunately despite everything he really has not had any significant improvement. He tells me that he cannot even really remember exactly how this began but he presumed it may have rubbed on his shoes or something of that nature. 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 He tells me that this does seem to help. Fortunately there is no signs of active infection at this time. His most recent hemoglobin A1c was 8.1 though he knows this was this year he cannot tell me the exact time. His fluid pills currently to help with some of the lower extremity edema although he does obviously have signs of venous stasis/lymphedema. Currently there is no evidence of active infection. No fevers, chills, nausea, vomiting, or diarrhea. Patient has had fairly recent ABIs which were performed on 07/19/2020 and revealed that he has normal findings in both the ankle and toe locations bilaterally. His ABI on the right was 1.09 on the left was 1.08 with a TBI on the right of 0.88 and on the left of 0.94. Triphasic flow was noted throughout. 10/08/2020 on evaluation today patient appears to be doing pretty well in regard to his left heel currently in fact this is doing a great job and seems to be healing quite nicely. Unfortunately on his right leg he had a pile of wood that actually fell on him injuring his right leg this is somewhat erythematous has me concerned little bit about cellulitis though there is not really a good area to culture at this point. Electronic Signature(s) Signed: 10/08/2020 8:51:12 AM By: Worthy Keeler PA-C Entered By: Worthy Keeler on 10/08/2020 08:51:12 [name], [name] ([id number]) -------------------------------------------------------------------------------- Physical Exam Details Patient Name: [name], [name] Date of Service: 10/08/2020 9:00 AM Medical Record Number: [id number] Patient Account Number: [id number] Date of Birth/Sex: 03-20-1946 (74 y.o.  M) Treating RN: [name] Primary  Care Provider: Fulton Reek Other Clinician: Referring Provider: Fulton Reek Treating Provider/Extender: Skipper Cliche in Treatment: 2 Constitutional Well-nourished and well-hydrated in no acute distress. Respiratory normal breathing without difficulty. Psychiatric this patient is able to make decisions and demonstrates good insight into disease process. Alert and Oriented x 3. pleasant and cooperative. Notes Upon inspection patient's wound bed actually showed signs of good granulation at this time. There does not appear to be any evidence of active infection which is great news and overall I am extremely pleased with where things stand today. No fevers, chills, nausea, vomiting, or diarrhea. With regard to his right leg I do believe that we will need to see what we can do as far as trying to get some of this erythema under control I am concerned about the possibility of infection I will go ahead and start him on something for this were also can use Tubigrip to try to help with any edema control. The patient is in agreement with all the above peer Electronic Signature(s) Signed: 10/08/2020 8:51:54 AM By: Worthy Keeler PA-C Entered By: Worthy Keeler on 10/08/2020 08:51:54 [name] ([id number]) -------------------------------------------------------------------------------- Physician Orders Details Patient Name: [name], [name]. Date of Service: 10/08/2020 9:00 AM Medical Record Number: [id number] Patient Account Number: [id number] Date of Birth/Sex: 11/17/1945 (74 y.o. 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 Wound #2 Right Lower Leg o Topical Lidocaine 4% cream applied to wound bed prior to debridement (In Clinic Only). Primary Wound Dressing Wound #1 Left Calcaneus o Silver Collagen Wound #2 Right Lower Leg o Silver Alginate Secondary Dressing Wound #1 Left Calcaneus o ABD pad - secured with stretch net Wound #2 Right Lower Leg o ABD and Kerlix/Conform - Tubigrip to cover Dressing Change Frequency Wound #1 Left Calcaneus o Change Dressing Tuesday and Thursday. Wound #2 Right Lower Leg o Change Dressing Tuesday and Thursday. Follow-up Appointments Wound #1 Left Calcaneus o Return Appointment in 2 weeks. Wound #2 Right Lower Leg o Return Appointment in 2 weeks. Edema Control Wound #1 Left Calcaneus o Elevate legs to the level of the heart and pump ankles as often as possible Wound #2 Right Lower Leg o Elevate legs to the level of the heart and pump ankles as often as possible Off-Loading Wound #1 Left Calcaneus o Other: - heel off-loader [name], [name] ([id number]) Additional Orders / Instructions Wound #1 Left Calcaneus o Increase protein intake. Wound #2 Right Lower Leg o Increase protein intake. Patient Medications Allergies: tetracycline Notifications Medication Indication Start End Keflex 10/08/2020 DOSE 1 - oral 500 mg capsule - 1 capsule oral taken 4 times per day for 10 days Electronic Signature(s) Signed: 10/08/2020 8:55:35 AM By: Worthy Keeler PA-C Entered By: Worthy Keeler on 10/08/2020 08:55:34 [name] ([id number]) -------------------------------------------------------------------------------- Problem List Details Patient Name: [name], [name]. Date of Service: 10/08/2020  9:00 AM Medical Record Number: [id number] Patient Account Number: [id number] Date of Birth/Sex: 01/18/1946 (74 y.o. 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, stage 3 09/24/2020 No Yes L97.812 Non-pressure chronic ulcer of other part of right lower leg with fat layer 10/08/2020 No Yes exposed Z95.4 Presence of other heart-valve replacement 09/24/2020 No Yes Z79.01 Long term (current) use of anticoagulants 09/24/2020 No Yes Inactive Problems Resolved Problems Electronic Signature(s) Signed: 10/08/2020 8:56:37 AM By: Worthy Keeler PA-C Previous Signature: 10/08/2020 8:39:35 AM Version By: Worthy Keeler PA-C Entered By: Worthy Keeler on 10/08/2020 08:56:37 [name] ([id number]) -------------------------------------------------------------------------------- Progress Note Details Patient Name: [name]. Date of Service: 10/08/2020 9:00 AM Medical Record Number: [id number] Patient Account Number: [id number] Date of Birth/Sex: 1946/07/02 (74 y.o. M) Treating RN: [name] Primary Care Provider: Fulton Reek Other Clinician: Referring Provider: Fulton Reek Treating Provider/Extender: Skipper Cliche in Treatment: 2 Subjective Chief Complaint Information obtained from Patient Left heel pressure ulcer and Right LE traumatic ulcers History of Present Illness (HPI) 09/24/2020 on evaluation today patient presents today for a heel ulcer that he tells me has been present for about 2 years. He has been seeing podiatry and they have been attempting to manage this including what sounds to be a total contact cast, Unna boot, and just standard  dressings otherwise as well. Most recently has been using triple antibiotic ointment. With that being said unfortunately despite everything he really has not had any significant improvement. He tells me that he cannot even really remember exactly how this began but he presumed it may have rubbed on his shoes or something of that nature. 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 He tells me that this does seem to help. Fortunately there is no signs of active infection at this time. His most recent hemoglobin A1c was 8.1 though he knows this was this year he cannot tell me the exact time. His fluid pills currently to help with some of the lower extremity edema although he does obviously have signs of venous stasis/lymphedema. Currently there is no evidence of active infection. No fevers, chills, nausea, vomiting, or diarrhea. Patient has had fairly recent ABIs which were performed on 07/19/2020 and revealed that he has normal findings in both the ankle and toe locations bilaterally. His ABI on the right was 1.09 on the left was 1.08 with a TBI on the right of 0.88 and on the left of 0.94. Triphasic flow was noted throughout. 10/08/2020 on evaluation today patient appears to be doing pretty well in regard to his left heel currently in fact this is doing a great job and seems to be healing quite nicely. Unfortunately on his right leg he had a pile of wood that actually fell on him injuring his right leg this is somewhat erythematous has me concerned little bit about cellulitis though there is not really a good area to culture at this point. Objective Constitutional Well-nourished and well-hydrated in no acute distress. Vitals  Time Taken: 8:10 AM, Height: 70 in, Weight: 262 lbs, BMI: 37.6, Temperature: 98.3 F, Pulse: 75 bpm, Respiratory Rate: 18 breaths/min,  Blood Pressure: 142/86 mmHg. Respiratory normal breathing without difficulty. Psychiatric this patient is able to make decisions and demonstrates good insight into disease process. Alert and Oriented x 3. pleasant and cooperative. General Notes: Upon inspection patient's wound bed actually showed signs of good granulation at this time. There does not appear to be any evidence of active infection which is great news and overall I am extremely pleased with where things stand today. No fevers, chills, nausea, vomiting, or diarrhea. With regard to his right leg I do believe that we will need to see what we can do as far as trying to get some of this erythema under control I am concerned about the possibility of infection I will go ahead and start him on something for this were also can use Tubigrip to try to help with any edema control. The patient is in agreement with all the above peer Integumentary (Hair, Skin) Wound #1 status is Open. Original cause of wound was Gradually Appeared. The wound is located on the Left Calcaneus. The wound measures 0.6cm length x 2.4cm width x 0.1cm depth; 1.131cm^2 area and 0.113cm^3 volume. There is Fat Layer (Subcutaneous Tissue) exposed. There is no tunneling or undermining noted. There is a small amount of serous drainage noted. The wound margin is flat and intact. There is large (67- 100%) pink, pale granulation within the wound bed. There is no necrotic tissue within the wound bed. Wound #2 status is Open. Original cause of wound was Skin Tear/Laceration. The wound is located on the Right Lower Leg. The wound measures 0.8cm length x 4cm width x 0.1cm depth; 2.513cm^2 area and 0.251cm^3 volume. There is Fat Layer (Subcutaneous Tissue) exposed. There is no tunneling or undermining noted. There is a small amount of serous drainage  noted. There is large (67-100%) red granulation within the wound bed. There is no necrotic tissue within the wound bed. 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 Wound #2 Right Lower Leg: Topical Lidocaine 4% cream applied to wound bed prior to debridement (In Clinic Only). Primary Wound Dressing: Wound #1 Left Calcaneus: Silver Collagen Wound #2 Right Lower Leg: Silver Alginate Secondary Dressing: Wound #1 Left Calcaneus: ABD pad - secured with stretch net Wound #2 Right Lower Leg: ABD and Kerlix/Conform - Tubigrip to cover Dressing Change Frequency: Wound #1 Left Calcaneus: Change Dressing Tuesday and Thursday. Wound #2 Right Lower Leg: Change Dressing Tuesday and Thursday. Follow-up Appointments: Wound #1 Left Calcaneus: Return Appointment in 2 weeks. Wound #2 Right Lower Leg: Return Appointment in 2 weeks. Edema Control: Wound #1 Left Calcaneus: Elevate legs to the level of the heart and pump ankles as often as possible Wound #2 Right Lower Leg: Elevate legs to the level of the heart and pump ankles as often as possible Off-Loading: Wound #1 Left Calcaneus: Other: - heel off-loader Additional Orders / Instructions: Wound #1 Left Calcaneus: Increase protein intake. Wound #2 Right Lower Leg: Increase protein intake. The following medication(s) was prescribed: Keflex oral 500 mg capsule 1 1 capsule oral taken 4 times per day for 10 days starting 10/08/2020 1 would recommend currently that we go ahead and  initiate treatment with silver alginate  dressing to the right leg and the open areas the patient is in agreement with that plan. 2. I am also can recommend that we have the patient go ahead and continue with the silver collagen on the left heel. 3. Will cover both with ABD pad and on the left heel we can use traction out on the right leg we will get a use Tubigrip. 4. I am also can recommend the patient continue to elevate his legs much as possible try to keep edema under good control. 5. I did send in a prescription for Keflex currently as this will interact minimally with his other medications and the patient is in agreement with that plan. We will see patient back for reevaluation in 1 week here in the clinic. If anything worsens or changes patient will contact our office for additional recommendations. [name], [name] ([id number]) Electronic Signature(s) Signed: 10/08/2020 8:57:06 AM By: Worthy Keeler PA-C Previous Signature: 10/08/2020 8:55:50 AM Version By: Worthy Keeler PA-C Entered By: Worthy Keeler on 10/08/2020 08:57:06 [name], [name] ([id number]) -------------------------------------------------------------------------------- SuperBill Details Patient Name: [name], PELZER. Date of Service: 10/08/2020 Medical Record Number: [id number] Patient Account Number: [id number] Date of Birth/Sex: 1946/08/27 (74 y.o. 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, stage 3 L97.812 Non-pressure chronic ulcer of other part of right lower leg with fat layer exposed Z95.4 Presence of other heart-valve replacement Z79.01 Long term (current) use of anticoagulants Physician Procedures CPT4 Code: [id number] Description: 99214 - WC PHYS LEVEL 4 - EST  PT Modifier: Quantity: 1 CPT4 Code: Description: ICD-10 Diagnosis Description E11.621 Type 2 diabetes mellitus with foot ulcer L89.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

## 2020-10-08 NOTE — Progress Notes (Addendum)
OTHMAN, MASUR ([id number]) Visit Report for 10/08/2020 Arrival Information Details Patient Name: [name], [name]. Date of Service: 10/08/2020 9:00 AM Medical Record Number: [id number] Patient Account Number: [id number] Date of Birth/Sex: 18-Nov-1945 (74 y.o. M) Treating RN: Dolan Amen Primary Care Grayling Schranz: Fulton Reek Other Clinician: Referring Ritvik Mczeal: Fulton Reek Treating Drae Mitzel/Extender: Skipper Cliche in Treatment: 2 Visit Information History Since Last Visit Pain Present Now: No Patient Arrived: Cane Arrival Time: 08:09 Accompanied By: self Transfer Assistance: None Patient Identification Verified: Yes Secondary Verification Process Completed: Yes Patient Has Alerts: Yes Patient Alerts: Type II Diabetic 81 mg Aspirin Warfarin Electronic Signature(s) Signed: 10/08/2020 11:47:16 AM By: Georges Mouse, Minus Breeding RN Entered By: Georges Mouse, Minus Breeding on 10/08/2020 08:10:47 [name] ([id number]) -------------------------------------------------------------------------------- Clinic Level of Care Assessment Details Patient Name: JP, EASTHAM. Date of Service: 10/08/2020 9:00 AM Medical Record Number: [id number] Patient Account Number: [id number] Date of Birth/Sex: 01-16-1946 (74 y.o. M) Treating RN: [name] Primary Care Rajveer Handler: Fulton Reek Other Clinician: Referring Ethelle Ola: Fulton Reek Treating Makiah Clauson/Extender: Skipper Cliche in Treatment: 2 Clinic Level of Care Assessment Items TOOL 4 Quantity Score []  - Use when only an EandM is performed on FOLLOW-UP visit 0 ASSESSMENTS - Nursing Assessment / Reassessment X - Reassessment of Co-morbidities (includes updates in patient status) 1 10 X- 1 5 Reassessment of Adherence to Treatment Plan ASSESSMENTS - Wound and Skin Assessment / Reassessment []  - Simple Wound Assessment / Reassessment - one wound 0 X- 2 5 Complex Wound Assessment / Reassessment - multiple wounds []  -  0 Dermatologic / Skin Assessment (not related to wound area) ASSESSMENTS - Focused Assessment []  - Circumferential Edema Measurements - multi extremities 0 []  - 0 Nutritional Assessment / Counseling / Intervention []  - 0 Lower Extremity Assessment (monofilament, tuning fork, pulses) []  - 0 Peripheral Arterial Disease Assessment (using hand held doppler) ASSESSMENTS - Ostomy and/or Continence Assessment and Care []  - Incontinence Assessment and Management 0 []  - 0 Ostomy Care Assessment and Management (repouching, etc.) PROCESS - Coordination of Care X - Simple Patient / Family Education for ongoing care 1 15 []  - 0 Complex (extensive) Patient / Family Education for ongoing care X- 1 10 Staff obtains Programmer, systems, Records, Test Results / Process Orders []  - 0 Staff telephones HHA, Nursing Homes / Clarify orders / etc []  - 0 Routine Transfer to another Facility (non-emergent condition) []  - 0 Routine Hospital Admission (non-emergent condition) []  - 0 New Admissions / Biomedical engineer / Ordering NPWT, Apligraf, etc. []  - 0 Emergency Hospital Admission (emergent condition) X- 1 10 Simple Discharge Coordination []  - 0 Complex (extensive) Discharge Coordination PROCESS - Special Needs []  - Pediatric / Minor Patient Management 0 []  - 0 Isolation Patient Management []  - 0 Hearing / Language / Visual special needs []  - 0 Assessment of Community assistance (transportation, D/C planning, etc.) []  - 0 Additional assistance / Altered mentation []  - 0 Support Surface(s) Assessment (bed, cushion, seat, etc.) INTERVENTIONS - Wound Cleansing / Measurement JOSTEN, WARMUTH ([id number]) []  - 0 Simple Wound Cleansing - one wound X- 2 5 Complex Wound Cleansing - multiple wounds X- 1 5 Wound Imaging (photographs - any number of wounds) []  - 0 Wound Tracing (instead of photographs) []  - 0 Simple Wound Measurement - one wound X- 2 5 Complex Wound Measurement - multiple  wounds INTERVENTIONS - Wound Dressings []  - Small Wound Dressing one or multiple wounds 0 []  - 0 Medium Wound Dressing one or multiple wounds X- 2 20  Large Wound Dressing one or multiple wounds []  - 0 Application of Medications - topical []  - 0 Application of Medications - injection INTERVENTIONS - Miscellaneous []  - External ear exam 0 []  - 0 Specimen Collection (cultures, biopsies, blood, body fluids, etc.) []  - 0 Specimen(s) / Culture(s) sent or taken to Lab for analysis []  - 0 Patient Transfer (multiple staff / Civil Service fast streamer / Similar devices) []  - 0 Simple Staple / Suture removal (25 or less) []  - 0 Complex Staple / Suture removal (26 or more) []  - 0 Hypo / Hyperglycemic Management (close monitor of Blood Glucose) []  - 0 Ankle / Brachial Index (ABI) - do not check if billed separately X- 1 5 Vital Signs Has the patient been seen at the hospital within the last three years: Yes Total Score: 130 Level Of Care: New/Established - Level 4 Electronic Signature(s) Unsigned Entered By: [name], BSN, RN, CWS, [name] on 10/08/2020 12:01:07 Signature(s): Date(s): [name] ([id number]) -------------------------------------------------------------------------------- Encounter Discharge Information Details Patient Name: BASHIR, MARCHETTI. Date of Service: 10/08/2020 9:00 AM Medical Record Number: [id number] Patient Account Number: [id number] Date of Birth/Sex: [birth year] (74 y.o. M) Treating RN: [name] Primary Care Ketrick Matney: Fulton Reek Other Clinician: Referring Melisse Caetano: Fulton Reek Treating Khala Tarte/Extender: Skipper Cliche in Treatment: 2 Encounter Discharge Information Items Discharge Condition: Stable Ambulatory Status: Ambulatory Discharge Destination: Home Transportation: Private Auto Accompanied By: self Schedule Follow-up Appointment: Yes Clinical Summary of Care: Electronic Signature(s) Signed: 10/08/2020 12:04:53 PM By: [name], BSN, RN, CWS,  [name] RN, BSN Entered By: [name], BSN, RN, CWS, [name] on 10/08/2020 12:04:52 [name] ([id number]) -------------------------------------------------------------------------------- Lower Extremity Assessment Details Patient Name: LARKIN, MORELOS. Date of Service: 10/08/2020 9:00 AM Medical Record Number: [id number] Patient Account Number: [id number] Date of Birth/Sex: 1946/08/22 (73 y.o. M) Treating RN: Dolan Amen Primary Care Twilla Khouri: Fulton Reek Other Clinician: Referring Unnamed Hino: Fulton Reek Treating Alisi Lupien/Extender: Jeri Cos Weeks in Treatment: 2 Edema Assessment Assessed: [Left: Yes] [Right: Yes] Edema: [Left: No] [Right: Yes] Calf Left: Right: Point of Measurement: 37 cm From Medial Instep 40 cm Ankle Left: Right: Point of Measurement: 13 cm From Medial Instep 23.5 cm Electronic Signature(s) Signed: 10/08/2020 11:47:16 AM By: Georges Mouse, Minus Breeding RN Entered By: Georges Mouse, Minus Breeding on 10/08/2020 08:24:29 [name] ([id number]) -------------------------------------------------------------------------------- Multi Wound Chart Details Patient Name: MARCOS, RUELAS. Date of Service: 10/08/2020 9:00 AM Medical Record Number: [id number] Patient Account Number: [id number] Date of Birth/Sex: 19-[name]-1946 (74 y.o. 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 N/A Extremity Secondary Etiology: Pressure Ulcer N/A N/A Comorbid History: Cataracts,  Arrhythmia, Coronary Cataracts, Arrhythmia, Coronary N/A Artery Disease, Hypertension, Type Artery Disease, Hypertension, Type II Diabetes, Osteoarthritis, II Diabetes, Osteoarthritis, Neuropathy Neuropathy Date Acquired: 09/24/1998 10/05/2020 N/A Weeks of Treatment: 2 0 N/A Wound Status: Open Open N/A Clustered Wound: No Yes N/A Clustered Quantity: N/A 2 N/A Measurements L x W x D (cm) 0.6x2.4x0.1 0.8x4x0.1 N/A Area (cm) : 1.131 2.513 N/A Volume (cm) : 0.113 0.251 N/A % Reduction in Area: 72.60% 0.00% N/A % Reduction in Volume: 72.60% 0.00% N/A Classification: Grade 2 Full Thickness Without Exposed N/A Support Structures Exudate Amount: Small  Small N/A Exudate Type: Serous Serous N/A Exudate Color: amber amber N/A Wound Margin: Flat and Intact N/A N/A Granulation Amount: Large (67-100%) Large (67-100%) N/A Granulation Quality: Pink, Pale Red N/A Necrotic Amount: None Present (0%) None Present (0%) N/A Exposed Structures: Fat Layer (Subcutaneous Tissue): Fat Layer (Subcutaneous Tissue): N/A Yes Yes Fascia: No Fascia: No Tendon: No Tendon: No Muscle: No Muscle: No Joint: No Joint: No Bone: No Bone: No Epithelialization: N/A None N/A Treatment Notes Electronic Signature(s) PAUL, TRETTIN ([id number]) Signed: 10/08/2020 12:00:11 PM By: [name], BSN, RN, CWS, [name] RN, BSN Entered By: [name], BSN, RN, CWS, [name] on 10/08/2020 12:00:11 ARVEL, OQUINN ([id number]) -------------------------------------------------------------------------------- [place] Details Patient Name: DEDRICK, HEFFNER. Date of Service: 10/08/2020 9:00 AM Medical Record Number: [id number] Patient Account Number: [id number] Date of Birth/Sex: [birth year] (74 y.o. M) Treating RN: [name] Primary Care Lurline Caver: Fulton Reek Other Clinician: Referring Efrat Zuidema: Fulton Reek Treating Tonique Mendonca/Extender: Skipper Cliche in Treatment: 2 Active Inactive Orientation to the  Wound Care Program Nursing Diagnoses: Knowledge deficit related to the wound healing center program Goals: Patient/caregiver will verbalize understanding of the [place] Date Initiated: 09/24/2020 Target Resolution Date: 09/24/2020 Goal Status: Active Interventions: Provide education on orientation to the wound center Notes: Peripheral Neuropathy Nursing Diagnoses: Knowledge deficit related to disease process and management of peripheral neurovascular dysfunction Potential alteration in peripheral tissue perfusion (select prior to confirmation of diagnosis) Goals: Patient/caregiver will verbalize understanding of disease process and disease management Date Initiated: 09/24/2020 Target Resolution Date: 10/22/2020 Goal Status: Active Interventions: Assess signs and symptoms of neuropathy upon admission and as needed Provide education on Management of Neuropathy and Related Ulcers Notes: Pressure Nursing Diagnoses: Knowledge deficit related to causes and risk factors for pressure ulcer development Knowledge deficit related to management of pressures ulcers Goals: Patient will remain free from development of additional pressure ulcers Date Initiated: 09/24/2020 Target Resolution Date: 10/23/2020 Goal Status: Active Patient will remain free of pressure ulcers Date Initiated: 09/24/2020 Target Resolution Date: 10/22/2020 Goal Status: Active Interventions: Assess: immobility, friction, shearing, incontinence upon admission and as needed Assess offloading mechanisms upon admission and as needed Notes: Wound/Skin Impairment CAIGE, ALMEDA ([id number]) Nursing Diagnoses: Impaired tissue integrity Goals: Ulcer/skin breakdown will have a volume reduction of 30% by week 4 Date Initiated: 09/24/2020 Target Resolution Date: 10/25/2020 Goal Status: Active Ulcer/skin breakdown will have a volume reduction of 50% by week 8 Date Initiated: 09/24/2020 Target Resolution Date:  11/25/2020 Goal Status: Active Ulcer/skin breakdown will have a volume reduction of 80% by week 12 Date Initiated: 09/24/2020 Target Resolution Date: 12/23/2020 Goal Status: Active Ulcer/skin breakdown will heal within 14 weeks Date Initiated: 09/24/2020 Target Resolution Date: 01/13/2021 Goal Status: Active Interventions: Assess patient/caregiver ability to obtain necessary supplies Assess patient/caregiver ability to perform ulcer/skin care regimen upon admission and as needed Treatment Activities: Referred to DME [facility] for dressing supplies : 09/24/2020 Skin care regimen initiated : 09/24/2020 Topical wound management initiated : 09/24/2020 Notes: Electronic Signature(s) Signed: 10/08/2020 11:59:57 AM By: [name], BSN, RN, CWS, [name] RN, BSN Entered By: [name], BSN, RN, CWS, [name] on 10/08/2020 11:59:56 [name] ([id number]) -------------------------------------------------------------------------------- Pain Assessment Details Patient Name: JAMAINE, QUINTIN. Date of Service: 10/08/2020 9:00 AM Medical Record Number: [id number] Patient Account Number: [id number] Date of Birth/Sex: 05-06-1946 (74 y.o. M) Treating RN: Dolan Amen Primary Care Kennidy Lamke: Fulton Reek Other Clinician: Referring Anju Sereno: Fulton Reek Treating Chrisie Jankovich/Extender: Skipper Cliche in Treatment: 2 Active Problems Location of Pain Severity and Description of  Pain Patient Has Paino No Site Locations Rate the pain. Current Pain Level: 0 Pain Management and Medication Current Pain Management: Electronic Signature(s) Signed: 10/08/2020 11:47:16 AM By: Georges Mouse, Minus Breeding RN Entered By: Georges Mouse, Minus Breeding on 10/08/2020 08:13:32 [name] ([id number]) -------------------------------------------------------------------------------- Patient/Caregiver Education Details Patient Name: ISHAN, SANROMAN. Date of Service: 10/08/2020 9:00 AM Medical Record Number: [id number] Patient  Account Number: [id number] Date of Birth/Gender: [birth year] (74 y.o. M) Treating RN: [name] Primary Care Physician: Fulton Reek Other Clinician: Referring Physician: Fulton Reek Treating Physician/Extender: Skipper Cliche in Treatment: 2 Education Assessment Education Provided To: Patient Education Topics Provided Wound/Skin Impairment: Handouts: Caring for Your Ulcer Methods: Demonstration, Explain/Verbal Responses: State content correctly Electronic Signature(s) Unsigned Entered By: [name], BSN, RN, CWS, [name] on 10/08/2020 12:02:22 Signature(s): Date(s): [name] ([id number]) -------------------------------------------------------------------------------- Wound Assessment Details Patient Name: DANNY, YACKLEY. Date of Service: 10/08/2020 9:00 AM Medical Record Number: [id number] Patient Account Number: [id number] Date of Birth/Sex: 1946/06/01 (74 y.o. M) Treating RN: Dolan Amen Primary Care Tejuan Gholson: Fulton Reek Other Clinician: Referring Herve Haug: Fulton Reek Treating Isel [name]/Extender: Skipper Cliche in Treatment: 2 Wound Status Wound Number: 1 Primary Diabetic Wound/Ulcer of the Lower Extremity Etiology: Wound Location: Left Calcaneus Secondary Pressure Ulcer Wounding Event: Gradually Appeared Etiology: Date Acquired: 09/24/1998 Wound Open Weeks Of Treatment: 2 Status: Clustered Wound: No Comorbid Cataracts, Arrhythmia, Coronary Artery Disease, History: Hypertension, Type II Diabetes, Osteoarthritis, Neuropathy Photos Wound Measurements Length: (cm) 0.6 % Redu Width: (cm) 2.4 % Redu Depth: (cm) 0.1 Tunnel Area: (cm) 1.131 Under Volume: (cm) 0.113 ction in Area: 72.6% ction in Volume: 72.6% ing: No mining: No Wound Description Classification: Grade 2 Foul O Wound Margin: Flat and Intact Slough Exudate Amount: Small Exudate Type: Serous Exudate Color: amber dor After Cleansing: No /Fibrino Yes Wound  Bed Granulation Amount: Large (67-100%) Exposed Structure Granulation Quality: Pink, Pale Fascia Exposed: No Necrotic Amount: None Present (0%) Fat Layer (Subcutaneous Tissue) Exposed: Yes Tendon Exposed: No Muscle Exposed: No Joint Exposed: No Bone Exposed: No Treatment Notes Wound #1 (Left Calcaneus) Notes Trixie Deis, JOCELYN, LOWERY ([id number]) Electronic Signature(s) Signed: 10/08/2020 11:47:16 AM By: Georges Mouse, Minus Breeding RN Entered By: Georges Mouse, Minus Breeding on 10/08/2020 08:23:13 [name] ([id number]) -------------------------------------------------------------------------------- Wound Assessment Details Patient Name: QUILLAN, WHITTER. Date of Service: 10/08/2020 9:00 AM Medical Record Number: [id number] Patient Account Number: [id number] Date of Birth/Sex: 1946-03-02 (74 y.o. M) Treating RN: Dolan Amen Primary Care Nishtha Raider: Fulton Reek Other Clinician: Referring Daelin Haste: Fulton Reek Treating Amiracle Neises/Extender: Skipper Cliche in Treatment: 2 Wound Status Wound Number: 2 Primary Skin Tear Etiology: Wound Location: Right Lower Leg Wound Open Wounding Event: Skin Tear/Laceration Status: Date Acquired: 10/05/2020 Comorbid Cataracts, Arrhythmia, Coronary Artery Disease, Weeks Of Treatment: 0 History: Hypertension, Type II Diabetes, Osteoarthritis, Clustered Wound: Yes Neuropathy Photos Wound Measurements Length: (cm) 0.8 Width: (cm) 4 Depth: (cm) 0.1 Clustered Quantity: 2 Area: (cm) 2.513 Volume: (cm) 0.251 % Reduction in Area: 0% % Reduction in Volume: 0% Epithelialization: None Tunneling: No Undermining: No Wound Description Classification: Full Thickness Without Exposed Support Stru Exudate Amount: Small Exudate Type: Serous Exudate Color: amber ctures Foul Odor After Cleansing: No Slough/Fibrino No Wound Bed Granulation Amount: Large (67-100%) Exposed Structure Granulation Quality: Red Fascia Exposed:  No Necrotic Amount: None Present (0%) Fat Layer (Subcutaneous Tissue) Exposed: Yes Tendon Exposed: No Muscle Exposed: No Joint Exposed: No Bone Exposed: No Treatment Notes Wound #2 (Right Lower Leg) Notes Prisma, tubigrip E, Electronic Signature(s) OTHO, MICHALIK ([id number]) Signed:  10/08/2020 11:47:16 AM By: Georges Mouse, Minus Breeding RN Entered By: Georges Mouse, Minus Breeding on 10/08/2020 08:22:39 [name] ([id number]) -------------------------------------------------------------------------------- Vitals Details Patient Name: SEBASTIAN, LURZ. Date of Service: 10/08/2020 9:00 AM Medical Record Number: [id number] Patient Account Number: [id number] Date of Birth/Sex: 24-Sep-1946 (74 y.o. M) Treating RN: Dolan Amen Primary Care Dayla Gasca: Fulton Reek Other Clinician: Referring Renold Kozar: Fulton Reek Treating Jakyria Bleau/Extender: Skipper Cliche in Treatment: 2 Vital Signs Time Taken: 08:10 Temperature (F): 98.3 Height (in): 70 Pulse (bpm): 75 Weight (lbs): 262 Respiratory Rate (breaths/min): 18 Body Mass Index (BMI): 37.6 Blood Pressure (mmHg): 142/86 Reference Range: 80 - 120 mg / dl Electronic Signature(s) Signed: 10/08/2020 11:47:16 AM By: Georges Mouse, Minus Breeding RN Entered By: Georges Mouse, Minus Breeding on 10/08/2020 08:13:14

## 2020-10-10 DIAGNOSIS — G894 Chronic pain syndrome: Secondary | ICD-10-CM | POA: Diagnosis not present

## 2020-10-10 DIAGNOSIS — M47816 Spondylosis without myelopathy or radiculopathy, lumbar region: Secondary | ICD-10-CM | POA: Diagnosis not present

## 2020-10-24 ENCOUNTER — Other Ambulatory Visit: Payer: Self-pay

## 2020-10-24 ENCOUNTER — Encounter: Payer: Medicare HMO | Attending: Physician Assistant | Admitting: Physician Assistant

## 2020-10-24 DIAGNOSIS — L97812 Non-pressure chronic ulcer of other part of right lower leg with fat layer exposed: Secondary | ICD-10-CM | POA: Insufficient documentation

## 2020-10-24 DIAGNOSIS — Z952 Presence of prosthetic heart valve: Secondary | ICD-10-CM | POA: Insufficient documentation

## 2020-10-24 DIAGNOSIS — E11622 Type 2 diabetes mellitus with other skin ulcer: Secondary | ICD-10-CM | POA: Diagnosis not present

## 2020-10-24 DIAGNOSIS — L89629 Pressure ulcer of left heel, unspecified stage: Secondary | ICD-10-CM | POA: Diagnosis present

## 2020-10-24 DIAGNOSIS — L89623 Pressure ulcer of left heel, stage 3: Secondary | ICD-10-CM | POA: Insufficient documentation

## 2020-10-24 NOTE — Progress Notes (Addendum)
[name] ([id number]) Visit Report for 10/24/2020 Chief Complaint Document Details Patient Name: [name], [name]. Date of Service: 10/24/2020 10:15 AM Medical Record Number: [id number] Patient Account Number: [id number] Date of Birth/Sex: 03-18-1946 (76 y.o. M) Treating RN: Huel Coventry Primary Care Provider: [name] Other Clinician: Referring Provider: [name] Treating Provider/Extender: Rowan Blase in Treatment: 4 Information Obtained from: Patient Chief Complaint Left heel pressure ulcer and Right LE traumatic ulcers Electronic Signature(s) Signed: 10/24/2020 10:43:27 AM By: Lenda Kelp PA-C Entered By: Lenda Kelp on 10/24/2020 10:43:27 DOWELL, GIANGRANDE ([id number]) -------------------------------------------------------------------------------- Debridement Details Patient Name: [name]. Date of Service: 10/24/2020 10:15 AM Medical Record Number: [id number] Patient Account Number: [id number] Date of Birth/Sex: 1946/01/09 (75 y.o. M) Treating RN: [name] Primary Care Provider: [name] Other Clinician: Referring Provider: [name] Treating Provider/Extender: Rowan Blase in Treatment: 4 Debridement Performed for Wound #1 Left Calcaneus Assessment: Performed By: Physician Nelida Meuse., PA-C Debridement Type: Debridement Severity of Tissue Pre Debridement: Fat layer exposed Level of Consciousness (Pre- Awake and Alert procedure): Pre-procedure Verification/Time Out Yes - 11:21 Taken: Total Area Debrided (L x W): 2.7 (cm) x 1 (cm) = 2.7 (cm) Tissue and other material Callus, Slough, Subcutaneous, Slough debrided: Level: Skin/Subcutaneous Tissue Debridement Description: Excisional Instrument: Curette Bleeding: Minimum Hemostasis Achieved: Pressure Response to Treatment: Procedure was tolerated well Level of Consciousness (Post- Awake and Alert procedure): Post Debridement Measurements of Total  Wound Length: (cm) 2.7 Width: (cm) 1 Depth: (cm) 0.2 Volume: (cm) 0.424 Character of Wound/Ulcer Post Debridement: Stable Severity of Tissue Post Debridement: Fat layer exposed Post Procedure Diagnosis Same as Pre-procedure Electronic Signature(s) Signed: 10/25/2020 4:17:09 PM By: Lenda Kelp PA-C Signed: 10/25/2020 4:22:48 PM By: [name] RN Entered By: [name] on 10/24/2020 11:22:23 Barrie Folk ([id number]) -------------------------------------------------------------------------------- HPI Details Patient Name: [name]. Date of Service: 10/24/2020 10:15 AM Medical Record Number: [id number] Patient Account Number: [id number] Date of Birth/Sex: Oct 28, 1945 (75 y.o. M) Treating RN: Huel Coventry Primary Care Provider: [name] Other Clinician: Referring Provider: [name] Treating Provider/Extender: Rowan Blase in Treatment: 4 History of Present Illness HPI Description: 09/24/2020 on evaluation today patient presents today for a heel ulcer that he tells me has been present for about 2 years. He has been seeing podiatry and they have been attempting to manage this including what sounds to be a total contact cast, Unna boot, and just standard dressings otherwise as well. Most recently has been using triple antibiotic ointment. With that being said unfortunately despite everything he really has not had any significant improvement. He tells me that he cannot even really remember exactly how this began but he presumed it may have rubbed on his shoes or something of that nature. 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 He tells me that this does seem to help. Fortunately  there is no signs of active infection at this time. His most recent hemoglobin A1c was 8.1 though he knows this was this year he cannot tell me the exact time. His fluid pills currently to help with some of the lower extremity edema although he does obviously have signs of venous stasis/lymphedema. Currently there is no evidence of  active infection. No fevers, chills, nausea, vomiting, or diarrhea. Patient has had fairly recent ABIs which were performed on 07/19/2020 and revealed that he has normal findings in both the ankle and toe locations bilaterally. His ABI on the right was 1.09 on the left was 1.08 with a TBI on the right of 0.88 and on the left of 0.94. Triphasic flow was noted throughout. 10/08/2020 on evaluation today patient appears to be doing pretty well in regard to his left heel currently in fact this is doing a great job and seems to be healing quite nicely. Unfortunately on his right leg he had a pile of wood that actually fell on him injuring his right leg this is somewhat erythematous has me concerned little bit about cellulitis though there is not really a good area to culture at this point. 10/24/2020 upon evaluation today patient appears to be doing well with regard to his heel ulcer. He is showing signs of improvement which is great news. His right leg is completely healed. Overall I feel like he is doing excellent and there is no signs of infection. Electronic Signature(s) Signed: 10/24/2020 11:29:27 AM By: Lenda Kelp PA-C Entered By: Lenda Kelp on 10/24/2020 11:29:26 Barrie Folk ([id number]) -------------------------------------------------------------------------------- Physical Exam Details Patient Name: [name] Date of Service: 10/24/2020 10:15 AM Medical Record Number: [id number] Patient Account Number: [id number] Date of Birth/Sex: Nov 12, 1945 (75 y.o. M) Treating RN: Huel Coventry Primary Care Provider: [name] Other Clinician: Referring  Provider: [name] Treating Provider/Extender: Rowan Blase in Treatment: 4 Constitutional Well-nourished and well-hydrated in no acute distress. Respiratory normal breathing without difficulty. Psychiatric this patient is able to make decisions and demonstrates good insight into disease process. Alert and Oriented x 3. pleasant and cooperative. Notes Patient's wound bed currently showed signs of good granulation at this time. There does not appear to be any evidence of active infection which is great news I did perform a little bit of debridement on the heel in order to clear away some of the callus as well as minimal slough from the surface of wound down to good subcutaneous tissue he tolerated that today well with minimal bleeding noted Electronic Signature(s) Signed: 10/24/2020 11:29:45 AM By: Lenda Kelp PA-C Entered By: Lenda Kelp on 10/24/2020 11:29:45 Barrie Folk ([id number]) -------------------------------------------------------------------------------- Physician Orders Details Patient Name: DAEVON, NORQUIST. Date of Service: 10/24/2020 10:15 AM Medical Record Number: [id number] Patient Account Number: [id number] Date of Birth/Sex: 02/18/1946 (75 y.o. 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 Bathing/ Shower/ Hygiene o May shower without wound dressing. Wound Treatment Wound #1 - Calcaneus Wound  Laterality: Left Cleanser: Normal Saline Discharge Instructions: Wash your hands with soap and water. Remove old dressing, discard into plastic bag and place into trash. Cleanse the wound with Normal Saline prior to applying a clean dressing using gauze sponges, not tissues or cotton balls. Do not scrub or use excessive force. Pat dry using gauze sponges, not tissue or cotton balls. Primary Dressing: Silvercel Small 2x2 (in/in) Discharge  Instructions: Apply Silvercel Small 2x2 (in/in) as instructed Secondary Dressing: Bordered Gauze Sterile-HBD 4x4 (in/in) Discharge Instructions: Cover wound with Bordered Guaze Sterile as directed Electronic Signature(s) Signed: 10/25/2020 4:17:09 PM By: Lenda Kelp PA-C Signed: 10/25/2020 4:22:48 PM By: [name] RN Entered By: [name] on 10/24/2020 11:29:59 Barrie Folk ([id number]) -------------------------------------------------------------------------------- Problem List Details Patient Name: MARIOS, YELLOWHAIR. Date of Service: 10/24/2020 10:15 AM Medical Record Number: [id number] Patient Account Number: [id number] Date of Birth/Sex: [birth year] (75 y.o. 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 Date of Service: 10/24/2020 10:15 AM Medical Record Number: [id number] Patient Account Number: [id number] Date of Birth/Sex: Oct 10, 1946 (75 y.o. M) Treating RN: Huel Coventry Primary Care Provider: [name] Other Clinician: Referring Provider: [name] Treating Provider/Extender: Rowan Blase in Treatment: 4 Subjective Chief Complaint Information obtained from Patient Left heel pressure ulcer and Right LE traumatic ulcers History of Present Illness (HPI) 09/24/2020 on evaluation today patient presents today for a heel ulcer that he tells me has been present for about 2 years. He has been seeing podiatry and they have been attempting to manage this including what sounds to be a total contact cast, Unna boot, and just standard dressings otherwise as well. Most recently has been using triple antibiotic ointment. With that being said unfortunately despite everything he really has not had any significant improvement. He tells me that he cannot even really remember exactly how this began but he presumed it may have rubbed on his shoes or something of that nature. 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 He tells me that this does seem to help. Fortunately there is no signs of active infection at this time. His most recent hemoglobin A1c was 8.1 though he knows this was this year he cannot tell me the exact time. His fluid pills currently to help with some of the lower extremity edema although he does obviously have signs of  venous stasis/lymphedema. Currently there is no evidence of active infection. No fevers, chills, nausea, vomiting, or diarrhea. Patient has had fairly recent ABIs which were performed on 07/19/2020 and revealed that he has normal findings in both the ankle and toe locations bilaterally. His ABI on the right was 1.09 on the left was 1.08 with a TBI on the right of 0.88 and on the left of 0.94. Triphasic flow was noted throughout. 10/08/2020 on evaluation today patient appears to be doing pretty well in regard to his left heel currently in fact this is doing a great  job and seems to be healing quite nicely. Unfortunately on his right leg he had a pile of wood that actually fell on him injuring his right leg this is somewhat erythematous has me concerned little bit about cellulitis though there is not really a good area to culture at this point. 10/24/2020 upon evaluation today patient appears to be doing well with regard to his heel ulcer. He is showing signs of improvement which is great news. His right leg is completely healed. Overall I feel like he is doing excellent and there is no signs of infection. Objective Constitutional Well-nourished and well-hydrated in no acute distress. Vitals Time Taken: 10:30 AM, Height: 70 in, Weight: 262 lbs, BMI: 37.6, Temperature: 97.8 F, Pulse: 79 bpm, Respiratory Rate: 18 breaths/min, Blood Pressure: 193/74 mmHg. Respiratory normal breathing without difficulty. Psychiatric this patient is able to make decisions and demonstrates good insight into disease process. Alert and Oriented x 3. pleasant and cooperative. General Notes: Patient's wound bed currently showed signs of good granulation at this time. There does not appear to be any evidence of active infection which is great news I did perform a little bit of debridement on the heel in order to clear away some of the callus as well as minimal slough from the surface of wound down to good subcutaneous tissue  he tolerated that today well with minimal bleeding noted Integumentary (Hair, Skin) Wound #1 status is Open. Original cause of wound was Gradually Appeared. The wound is located on the Left Calcaneus. The wound measures 2.7cm length x 1cm width x 0.1cm depth; 2.121cm^2 area and 0.212cm^3 volume. There is Fat Layer (Subcutaneous Tissue) exposed. There is a small amount of serous drainage noted. The wound margin is flat and intact. There is large (67-100%) pink, pale granulation within the wound bed. There is no necrotic tissue within the wound bed. Wound #2 status is Healed - Epithelialized. Original cause of wound was Skin Tear/Laceration. The wound is located on the Right Lower Leg. The wound measures 0cm length x 0cm width x 0cm depth; 0cm^2 area and 0cm^3 volume. There is Fat Layer (Subcutaneous Tissue) exposed. There is no tunneling or undermining noted. There is a small amount of serous drainage noted. There is large (67-100%) red granulation within XAYNE, BERARDINO. ([id number]) the wound bed. There is no necrotic tissue within the wound bed. 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 There was a Excisional Skin/Subcutaneous Tissue Debridement with a total area of 2.7 sq cm performed by Nelida Meuse., PA-C. With the following instrument(s): Curette Material removed includes Callus, Subcutaneous Tissue, and Slough. A time out was conducted at 11:21, prior to the start of the procedure. A Minimum amount of bleeding was controlled with Pressure. The procedure  was tolerated well. Post Debridement Measurements: 2.7cm length x 1cm width x 0.2cm depth; 0.424cm^3 volume. Character of Wound/Ulcer Post Debridement is stable. Severity of Tissue Post Debridement is: Fat layer exposed. Post procedure Diagnosis Wound #1: Same as Pre-Procedure Plan Follow-up Appointments: Return Appointment in 1 week. Bathing/ Shower/ Hygiene: May shower without wound dressing. WOUND #1: - Calcaneus Wound Laterality: Left  Cleanser: Normal Saline Discharge Instructions: Wash your hands with soap and water. Remove old dressing, discard into plastic bag and place into trash. Cleanse the wound with Normal Saline prior to applying a clean dressing using gauze sponges, not tissues or cotton balls. Do not scrub or use excessive force. Pat dry using gauze sponges, not tissue or cotton balls. Primary Dressing: Silvercel Small 2x2 (in/in) Discharge Instructions: Apply Silvercel Small 2x2 (in/in) as instructed Secondary Dressing: Bordered Gauze Sterile-HBD 4x4 (in/in) Discharge Instructions: Cover wound with Bordered Guaze Sterile as directed 1. Would recommend currently that we continue with the silver collagen for the heel ulcer I think that still the best way to go. 2. I am also can recommend that we have the patient continue to monitor for any signs of infection such as increased pain or drainage. Right now this seems to be doing quite well. 3. I would also recommend we continue to cover with a border gauze dressing he is doing well in that regard. We will see patient back for reevaluation in 1 week here in the clinic. If anything worsens or changes patient will contact our office for additional recommendations. Electronic Signature(s) Signed: 10/24/2020 11:30:11 AM By: Lenda Kelp PA-C Entered By: Lenda Kelp on 10/24/2020 11:30:10 Barrie Folk ([id number]) -------------------------------------------------------------------------------- SuperBill Details Patient  Name: DETRICH, FAYER. Date of Service: 10/24/2020 Medical Record Number: [id number] Patient Account Number: [id number] Date of Birth/Sex: [birth year] (75 y.o. 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 Z95.4 Presence of other heart-valve replacement Z79.01 Long term (current) use of anticoagulants Facility Procedures CPT4 Code: [id number] Description: 11042 - DEB SUBQ TISSUE 20 SQ CM/< Modifier: Quantity: 1 CPT4 Code: Description: ICD-10 Diagnosis Description L97.812 Non-pressure chronic ulcer of other part of right lower leg with fat lay Modifier: er exposed Quantity: Physician Procedures CPT4 Code: [id number] Description: 11042 - WC PHYS SUBQ TISS 20 SQ CM Modifier: Quantity: 1 CPT4 Code: Description: ICD-10 Diagnosis Description L97.812 Non-pressure chronic ulcer of other part of right lower leg with fat lay Modifier: er exposed Quantity: Electronic Signature(s) Signed: 10/24/2020 11:30:21 AM By: Lenda Kelp PA-C Entered By: Lenda Kelp on 10/24/2020 11:30:20

## 2020-10-25 NOTE — Progress Notes (Signed)
[name], [name] ([id number]) Visit Report for 10/24/2020 Arrival Information Details Patient Name: [name], [name]. Date of Service: 10/24/2020 10:15 AM Medical Record Number: [id number] Patient Account Number: [id number] Date of Birth/Sex: Sep 19, 1946 (75 y.o. M) Treating RN: [name] Primary Care Mozell Haber: Fulton Reek Other Clinician: Referring Charnay Nazario: Fulton Reek Treating Kaitlinn Iversen/Extender: Skipper Cliche in Treatment: 4 Visit Information History Since Last Visit Added or deleted any medications: No Patient Arrived: [name] Any new allergies or adverse reactions: No Arrival Time: 10:31 Had a fall or experienced change in Yes Accompanied By: self activities of daily living that may affect Transfer Assistance: None risk of falls: Patient Identification Verified: Yes Signs or symptoms of abuse/neglect since last visito No Secondary Verification Process Completed: Yes Hospitalized since last visit: No Patient Has Alerts: Yes Implantable device outside of the clinic excluding No Patient Alerts: Type II Diabetic cellular tissue based products placed in the center 81 mg Aspirin since last visit: Warfarin Has Dressing in Place as Prescribed: No Pain Present Now: No Electronic Signature(s) Signed: 10/24/2020 4:51:21 PM By: Lorine Bears RCP, RRT, CHT Entered By: Lorine Bears on 10/24/2020 10:32:32 [name] ([id number]) -------------------------------------------------------------------------------- Encounter Discharge Information Details Patient Name: [name], [name]. Date of Service: 10/24/2020 10:15 AM Medical Record Number: [id number] Patient Account Number: [id number] Date of Birth/Sex: 04/17/1946 (74 y.o. M) Treating RN: Carlene Coria Primary Care Jessiah Wojnar: Fulton Reek Other Clinician: Referring Jermiyah Ricotta: Fulton Reek Treating Felicitas Sine/Extender: Skipper Cliche in Treatment: 4 Encounter Discharge Information Items  Post Procedure Vitals Discharge Condition: Stable Temperature (F): 97.8 Ambulatory Status: Ambulatory Pulse (bpm): 79 Discharge Destination: Home Respiratory Rate (breaths/min): 18 Transportation: Private Auto Blood Pressure (mmHg): 193/74 Accompanied By: self Schedule Follow-up Appointment: Yes Clinical Summary of Care: Electronic Signature(s) Signed: 10/25/2020 4:22:48 PM By: Carlene Coria RN Entered By: Carlene Coria on 10/24/2020 11:32:30 [name] ([id number]) -------------------------------------------------------------------------------- Lower Extremity Assessment Details Patient Name: [name], [name]. Date of Service: 10/24/2020 10:15 AM Medical Record Number: [id number] Patient Account Number: [id number] Date of Birth/Sex: 1945/11/08 (75 y.o. M) Treating RN: Carlene Coria Primary Care Jannine Abreu: Fulton Reek Other Clinician: Referring Leona [name]: Fulton Reek Treating Jocsan Mcginley/Extender: Skipper Cliche in Treatment: 4 Edema Assessment Assessed: [Left: No] [Right: No] [Left: Edema] [Right: :] Calf Left: Right: Point of Measurement: 37 cm From Medial Instep 38 cm 38 cm Ankle Left: Right: Point of Measurement: 13 cm From Medial Instep 24 cm 23 cm Electronic Signature(s) Signed: 10/25/2020 4:22:48 PM By: Carlene Coria RN Entered By: Carlene Coria on 10/24/2020 10:50:55 [name] ([id number]) -------------------------------------------------------------------------------- Multi Wound Chart Details Patient Name: [name]. Date of Service: 10/24/2020 10:15 AM Medical Record Number: [id number] Patient Account Number: [id number] Date of Birth/Sex: [birth year] (75 y.o. 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 N/A Extremity Secondary Etiology: Pressure Ulcer N/A N/A Comorbid History: Cataracts, Arrhythmia, Coronary Cataracts, Arrhythmia, Coronary N/A Artery Disease, Hypertension, Type Artery Disease, Hypertension, Type II Diabetes, Osteoarthritis, II Diabetes, Osteoarthritis, Neuropathy Neuropathy Date Acquired: 09/24/1998 10/05/2020 N/A Weeks of Treatment: 4 2 N/A Wound Status: Open Healed - Epithelialized N/A Clustered Wound: No Yes N/A Clustered Quantity: N/A 2 N/A Measurements L  x W x D (cm) 2.7x1x0.1 0x0x0 N/A Area (cm) : 2.121 0 N/A Volume (cm) : 0.212 0 N/A % Reduction in Area: 48.60% 100.00% N/A % Reduction in Volume: 48.50% 100.00% N/A Classification: Grade 2 Full Thickness Without Exposed N/A Support Structures Exudate Amount: Small Small N/A Exudate Type: Serous Serous N/A Exudate Color: amber amber N/A Wound Margin: Flat and Intact N/A N/A Granulation Amount: Large (67-100%) Large (67-100%) N/A Granulation Quality: Pink, Pale Red N/A Necrotic Amount: None Present (0%) None Present (0%) N/A Exposed Structures: Fat Layer (Subcutaneous Tissue): Fat Layer (Subcutaneous Tissue): N/A Yes Yes Fascia: No Fascia: No Tendon: No Tendon: No Muscle: No Muscle: No Joint: No Joint: No Bone: No Bone: No Epithelialization: N/A None N/A Treatment Notes Electronic Signature(s) [name], [name] ([id number]) Signed: 10/25/2020 4:22:48 PM By: Carlene Coria RN Entered By: Carlene Coria on 10/24/2020 11:21:16 [name] ([id number]) -------------------------------------------------------------------------------- Multi-Disciplinary Care Plan Details Patient Name: [name], [name]. Date of Service: 10/24/2020 10:15  AM Medical Record Number: [id number] Patient Account Number: [id number] Date of Birth/Sex: Sep 24, 1946 (75 y.o. M) Treating RN: Carlene Coria Primary Care Tatjana Turcott: Fulton Reek Other Clinician: Referring Venessa Wickham: Fulton Reek Treating Lydiana Milley/Extender: Skipper Cliche in Treatment: 4 Active Inactive Orientation to the Wound Care Program Nursing Diagnoses: Knowledge deficit related to the wound healing center program Goals: Patient/caregiver will verbalize understanding of the [place] Date Initiated: 09/24/2020 Target Resolution Date: 09/24/2020 Goal Status: Active Interventions: Provide education on orientation to the wound center Notes: Peripheral Neuropathy Nursing Diagnoses: Knowledge deficit related to disease process and management of peripheral neurovascular dysfunction Potential alteration in peripheral tissue perfusion (select prior to confirmation of diagnosis) Goals: Patient/caregiver will verbalize understanding of disease process and disease management Date Initiated: 09/24/2020 Target Resolution Date: 10/22/2020 Goal Status: Active Interventions: Assess signs and symptoms of neuropathy upon admission and as needed Provide education on Management of Neuropathy and Related Ulcers Notes: Pressure Nursing Diagnoses: Knowledge deficit related to causes and risk factors for pressure ulcer development Knowledge deficit related to management of pressures ulcers Goals: Patient will remain free from development of additional pressure ulcers Date Initiated: 09/24/2020 Target Resolution Date: 10/23/2020 Goal Status: Active Patient will remain free of pressure ulcers Date Initiated: 09/24/2020 Target Resolution Date: 10/22/2020 Goal Status: Active Interventions: Assess: immobility, friction, shearing, incontinence upon admission and as needed Assess offloading mechanisms upon admission and as needed Notes: Wound/Skin Impairment TANDRE, CONLY  ([id number]) Nursing Diagnoses: Impaired tissue integrity Goals: Ulcer/skin breakdown will have a volume reduction of 30% by week 4 Date Initiated: 09/24/2020 Target Resolution Date: 10/25/2020 Goal Status: Active Ulcer/skin breakdown will have a volume reduction of 50% by week 8 Date Initiated: 09/24/2020 Target Resolution Date: 11/25/2020 Goal Status: Active Ulcer/skin breakdown will have a volume reduction of 80% by week 12 Date Initiated: 09/24/2020 Target Resolution Date: 12/23/2020 Goal Status: Active Ulcer/skin breakdown will heal within 14 weeks Date Initiated: 09/24/2020 Target Resolution Date: 01/13/2021 Goal Status: Active Interventions: Assess patient/caregiver ability to obtain necessary supplies Assess patient/caregiver ability to perform ulcer/skin care regimen upon admission and as needed Treatment Activities: Referred to DME Naeemah Jasmer for dressing supplies : 09/24/2020 Skin care regimen initiated : 09/24/2020 Topical wound management initiated : 09/24/2020 Notes: Electronic Signature(s) Signed: 10/25/2020 4:22:48 PM By: Carlene Coria RN Entered By: Carlene Coria on 10/24/2020 11:21:09 [name] ([id number]) -------------------------------------------------------------------------------- Pain Assessment Details Patient Name: [name]. Date of Service: 10/24/2020 10:15 AM Medical Record Number: [id number] Patient Account Number: [id number] Date of Birth/Sex: 01-17-1946 (74 y.o.  M) Treating RN: Carlene Coria Primary Care Stephane Niemann: Fulton Reek Other Clinician: Referring Ashlin Hidalgo: Fulton Reek Treating Adiyah Lame/Extender: Skipper Cliche in Treatment: 4 Active Problems Location of Pain Severity and Description of Pain Patient Has Paino No Site Locations Pain Management and Medication Current Pain Management: Electronic Signature(s) Signed: 10/25/2020 4:22:48 PM By: Carlene Coria RN Entered By: Carlene Coria on 10/24/2020 10:38:41 [name]  ([id number]) -------------------------------------------------------------------------------- Patient/Caregiver Education Details Patient Name: [name], [name]. Date of Service: 10/24/2020 10:15 AM Medical Record Number: [id number] Patient Account Number: [id number] Date of Birth/Gender: 24-[name]-1946 (75 y.o. M) Treating RN: Carlene Coria Primary Care Physician: Fulton Reek Other Clinician: Referring Physician: Fulton Reek Treating Physician/Extender: Skipper Cliche in Treatment: 4 Education Assessment Education Provided To: Patient Education Topics Provided Welcome To [facility] [place]: Wound/Skin Impairment: Handouts: Caring for Your Ulcer Methods: Demonstration, Explain/Verbal Responses: State content correctly Electronic Signature(s) Signed: 10/25/2020 4:22:48 PM By: Carlene Coria RN Entered By: Carlene Coria on 10/24/2020 11:30:26 [name] ([id number]) -------------------------------------------------------------------------------- Wound Assessment Details Patient Name: [name], [name]. Date of Service: 10/24/2020 10:15 AM Medical Record Number: [id number] Patient Account Number: [id number] Date of Birth/Sex: February 15, 1946 (75 y.o. M) Treating RN: Carlene Coria Primary Care Leoda Smithhart: Fulton Reek Other Clinician: Referring Azrael Huss: Fulton Reek Treating Salaam Battershell/Extender: Skipper Cliche in Treatment: 4 Wound Status Wound Number: 1 Primary Diabetic Wound/Ulcer of the Lower Extremity Etiology: Wound Location: Left Calcaneus Secondary Pressure Ulcer Wounding Event: Gradually Appeared Etiology: Date Acquired: 09/24/1998 Wound Open Weeks Of Treatment: 4 Status: Clustered Wound: No Comorbid Cataracts, Arrhythmia, Coronary Artery Disease, History: Hypertension, Type II Diabetes, Osteoarthritis, Neuropathy Photos Wound Measurements Length: (cm) 2.7 % Reduc Width: (cm) 1 % Reduc Depth: (cm) 0.1 Area: (cm) 2.121 Volume: (cm) 0.212 tion in Area:  48.6% tion in Volume: 48.5% Wound Description Classification: Grade 2 Foul Od Wound Margin: Flat and Intact Slough/ Exudate Amount: Small Exudate Type: Serous Exudate Color: amber or After Cleansing: No Fibrino Yes Wound Bed Granulation Amount: Large (67-100%) Exposed Structure Granulation Quality: Pink, Pale Fascia Exposed: No Necrotic Amount: None Present (0%) Fat Layer (Subcutaneous Tissue) Exposed: Yes Tendon Exposed: No Muscle Exposed: No Joint Exposed: No Bone Exposed: No Treatment Notes Wound #1 (Calcaneus) Wound Laterality: Left Cleanser Normal Saline Quantity: 1 [name], [name] ([id number]) Discharge Instruction: Wash your hands with soap and water. Remove old dressing, discard into plastic bag and place into trash. Cleanse the wound with Normal Saline prior to applying a clean dressing using gauze sponges, not tissues or cotton balls. Do not scrub or use excessive force. Pat dry using gauze sponges, not tissue or cotton balls. Peri-Wound Care Topical Primary Dressing Silvercel Small 2x2 (in/in) Quantity: 1 Discharge Instruction: Apply Silvercel Small 2x2 (in/in) as instructed Secondary Dressing Bordered Gauze Sterile-HBD 4x4 (in/in) Quantity: 1 Discharge Instruction: Cover wound with Bordered Guaze Sterile as directed Secured With Compression Wrap Compression Stockings Add-Ons Electronic Signature(s) Signed: 10/25/2020 4:22:48 PM By: Carlene Coria RN Entered By: Carlene Coria on 10/24/2020 10:49:48 [name] ([id number]) -------------------------------------------------------------------------------- Wound Assessment Details Patient Name: [name], [name]. Date of Service: 10/24/2020 10:15 AM Medical Record Number: [id number] Patient Account Number: [id number] Date of Birth/Sex: 04/26/1946 (75 y.o. M) Treating RN: Carlene Coria Primary Care Moustafa Mossa: Fulton Reek Other Clinician: Referring Ellianah Cordy: Fulton Reek Treating Texie Tupou/Extender:  Skipper Cliche in Treatment: 4 Wound Status Wound Number: 2 Primary Skin Tear Etiology: Wound Location: Right Lower Leg Wound Healed - Epithelialized Wounding Event: Skin Tear/Laceration Status: Date Acquired: 10/05/2020 Comorbid Cataracts, Arrhythmia, Coronary Artery Disease,  Weeks Of Treatment: 2 History: Hypertension, Type II Diabetes, Osteoarthritis, Clustered Wound: Yes Neuropathy Photos Wound Measurements Length: (cm) Width: (cm) Depth: (cm) Clustered Quantity: Area: (cm) Volume: (cm) 0 % Reduction in Area: 100% 0 % Reduction in Volume: 100% 0 Epithelialization: None 2 Tunneling: No 0 Undermining: No 0 Wound Description Classification: Full Thickness Without Exposed Support Structu Exudate Amount: Small Exudate Type: Serous Exudate Color: amber res Foul Odor After Cleansing: No Slough/Fibrino No Wound Bed Granulation Amount: Large (67-100%) Exposed Structure Granulation Quality: Red Fascia Exposed: No Necrotic Amount: None Present (0%) Fat Layer (Subcutaneous Tissue) Exposed: Yes Tendon Exposed: No Muscle Exposed: No Joint Exposed: No Bone Exposed: No Treatment Notes Wound #2 (Lower Leg) Wound Laterality: Right Cleanser Peri-Wound Care Topical [name], [name] ([id number]) Primary Dressing Secondary Dressing Secured With Compression Wrap Compression Stockings Add-Ons Electronic Signature(s) Signed: 10/25/2020 4:22:48 PM By: Carlene Coria RN Entered By: Carlene Coria on 10/24/2020 11:19:16 [name] ([id number]) -------------------------------------------------------------------------------- Vitals Details Patient Name: [name]. Date of Service: 10/24/2020 10:15 AM Medical Record Number: [id number] Patient Account Number: [id number] Date of Birth/Sex: 12-06-1945 (75 y.o. M) Treating RN: [name] Primary Care Rodderick Holtzer: Fulton Reek Other Clinician: Referring Cohen Doleman: Fulton Reek Treating Shalawn Wynder/Extender: Skipper Cliche in Treatment: 4 Vital Signs Time Taken: 10:30 Temperature (F): 97.8 Height (in): 70 Pulse (bpm): 79 Weight (lbs): 262 Respiratory Rate (breaths/min): 18 Body Mass Index (BMI): 37.6 Blood Pressure (mmHg): 193/74 Reference Range: 80 - 120 mg / dl Electronic Signature(s) Signed: 10/24/2020 4:51:21 PM By: Lorine Bears RCP, RRT, CHT Entered By: Lorine Bears on 10/24/2020 10:33:00

## 2020-10-31 ENCOUNTER — Ambulatory Visit: Payer: Medicare HMO | Admitting: Physician Assistant

## 2020-11-07 ENCOUNTER — Encounter: Payer: Medicare HMO | Admitting: Physician Assistant

## 2020-11-07 ENCOUNTER — Other Ambulatory Visit: Payer: Self-pay

## 2020-11-07 DIAGNOSIS — L89623 Pressure ulcer of left heel, stage 3: Secondary | ICD-10-CM | POA: Diagnosis not present

## 2020-11-07 NOTE — Progress Notes (Signed)
LABAN, OROURKE ([id number]) Visit Report for 11/07/2020 Chief Complaint Document Details Patient Name: [name], [name]. Date of Service: 11/07/2020 11:00 AM Medical Record Number: [id number] Patient Account Number: [id number] Date of Birth/Sex: 1946-01-30 (75 y.o. M) Treating RN: [name] Primary Care Provider: Fulton [name] Other Clinician: Referring Provider: Fulton [name] Treating Provider/Extender: [name] in Treatment: 6 Information Obtained from: Patient Chief Complaint Left heel pressure ulcer and Right LE traumatic ulcers Electronic Signature(s) Signed: 11/07/2020 11:37:38 AM By: [name] Keeler PA-C Entered By: [name] on 11/07/2020 11:37:37 [name] ([id number]) -------------------------------------------------------------------------------- HPI Details Patient Name: [name], [name]. Date of Service: 11/07/2020 11:00 AM Medical Record Number: [id number] Patient Account Number: [id number] Date of Birth/Sex: [birth year] (75 y.o. M) Treating RN: [name] Primary Care Provider: Fulton [name] Other Clinician: Referring Provider: Fulton [name] Treating Provider/Extender: [name] in Treatment: 6 History of Present Illness HPI Description: 09/24/2020 on evaluation today patient presents today for a heel ulcer that he tells me has been present for about 2 years. He has been seeing podiatry and they have been attempting to manage this including what sounds to be a total contact cast, Unna boot, and just standard dressings otherwise as well. Most recently has been using triple antibiotic ointment. With that being said unfortunately despite everything he really has not had any significant improvement. He tells me that he cannot even really remember exactly how this began but he presumed it may have rubbed on his shoes or something of that nature. 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 He tells me that this does seem to help. Fortunately there is no signs of active infection at this time. His most recent hemoglobin A1c was 8.1 though he knows this was this year he cannot tell me the exact time. His fluid pills currently to help with some of the lower extremity edema although he does obviously have signs of venous stasis/lymphedema. Currently there is no evidence of active infection. No fevers, chills, nausea, vomiting, or diarrhea. Patient has had fairly recent ABIs which were performed on 07/19/2020 and revealed that he has normal findings in both the ankle and toe locations bilaterally. His ABI on the right was 1.09 on the left was 1.08 with a TBI on the right of 0.88 and on the left of 0.94. Triphasic flow was noted throughout. 10/08/2020 on evaluation today patient appears to be doing pretty well in regard to his left heel currently in fact this is doing a great job and seems to be healing quite nicely. Unfortunately on his right leg he had a pile of wood that actually fell on him injuring his right leg this is somewhat erythematous has me concerned little bit about cellulitis though there is not really a good area to culture at this point. 10/24/2020 upon evaluation today patient appears to be doing well with regard to his heel ulcer. He is showing signs of improvement which is great news. His right leg is completely healed. Overall I feel like he is doing excellent and there is no signs of infection. 11/07/2020 upon evaluation today patient appears to be doing well with regard to his heel ulcer. He tells me that last week when  he was unable to come in his wife actually thought that the wound was very close to closing if not closed. Then it began to  "reopen again". I really feel like what may have happened as the collagen may have dried over the wound bed and that because that misunderstanding with thinking that the wound was healing. With that being said I did not see it last week I do not know that for certain. Either way I feel like he is doing great today I see no signs of infection at this point. Electronic Signature(s) Signed: 11/07/2020 11:59:07 AM By: [name] Keeler PA-C Entered By: [name] on 11/07/2020 11:59:06 [name], [name] ([id number]) -------------------------------------------------------------------------------- Physical Exam Details Patient Name: [name] Date of Service: 11/07/2020 11:00 AM Medical Record Number: [id number] Patient Account Number: [id number] Date of Birth/Sex: 1946/06/08 (75 y.o. M) Treating RN: [name] Primary Care Provider: Fulton [name] Other Clinician: Referring Provider: Fulton [name] Treating Provider/Extender: [name] in Treatment: 6 Constitutional Well-nourished and well-hydrated in no acute distress. Respiratory normal breathing without difficulty. Psychiatric this patient is able to make decisions and demonstrates good insight into disease process. Alert and Oriented x 3. pleasant and cooperative. Notes Patient's wound bed not require sharp debridement at all and it seems to be doing quite well and very pleased in that regard Electronic Signature(s) Signed: 11/07/2020 11:59:48 AM By: [name] Keeler PA-C Entered By: [name] on 11/07/2020 11:59:48 [name] ([id number]) -------------------------------------------------------------------------------- Physician Orders Details Patient Name: [name]. Date of Service: 11/07/2020 11:00 AM Medical Record Number: [id number] Patient Account Number: [id number] Date of Birth/Sex: November 12, 1945 (75 y.o. M) Treating RN: Dolan Amen Primary Care Provider: Fulton [name] Other  Clinician: Referring Provider: Fulton [name] Treating Provider/Extender: [name] in Treatment: 6 Verbal / Phone Orders: No Diagnosis Coding Follow-up Appointments o Return Appointment in 2 weeks. Edema Control - Lymphedema / Segmental Compressive Device / Other Wound #1 Left Calcaneus o Patient to wear own compression stockings. o Elevate legs to the level of the heart and pump ankles as often as possible Wound Treatment Wound #1 - Calcaneus Wound Laterality: Left Cleanser: Byram Ancillary Kit - 15 Day Supply (DME) (Generic) 3 x Per Week/30 Days Discharge Instructions: Use supplies as instructed; Kit contains: (15) Saline Bullets; (15) 3x3 Gauze; 15 pr Gloves Cleanser: Normal Saline (Generic) 3 x Per Week/30 Days Discharge Instructions: Wash your hands with soap and water. Remove old dressing, discard into plastic bag and place into trash. Cleanse the wound with Normal Saline prior to applying a clean dressing using gauze sponges, not tissues or cotton balls. Do not scrub or use excessive force. Pat dry using gauze sponges, not tissue or cotton balls. Primary Dressing: Prisma 4.34 (in) (DME) (Generic) 3 x Per Week/30 Days Discharge Instructions: Moisten w/normal saline or sterile water; Cover wound as directed. Do not remove from wound bed. Secondary Dressing: Mepilex Border Flex, 4x4 (in/in) (Generic) 3 x Per Week/30 Days Discharge Instructions: Apply to wound as directed. Do not cut. Electronic Signature(s) Signed: 11/07/2020 11:57:33 AM By: Georges Mouse, Minus Breeding RN Signed: 11/07/2020 6:06:24 PM By: [name] Keeler PA-C Entered By: Georges Mouse, Minus Breeding on 11/07/2020 11:36:18 [name], [name] ([id number]) -------------------------------------------------------------------------------- Problem List Details Patient Name: [name], [name]. Date of Service: 11/07/2020 11:00 AM Medical Record Number: [id number] Patient Account Number: [id number] Date of Birth/Sex:  [birth year] (75 y.o. 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 Date of Service: 11/07/2020 11:00 AM Medical Record Number: [id number] Patient Account Number: [id number] Date of Birth/Sex: 03/10/1946 (75 y.o. M) Treating RN: [name] Primary Care Provider: Fulton [name] Other Clinician: Referring Provider: Fulton [name] Treating Provider/Extender: [name] in Treatment: 6 Subjective Chief Complaint Information obtained from Patient Left heel pressure ulcer and Right LE traumatic ulcers History of Present Illness (HPI) 09/24/2020 on evaluation today patient presents today for a heel ulcer that he tells me has been present for about 2 years. He has been seeing podiatry and they have been attempting to manage this including what sounds to be a total contact cast, Unna boot, and just standard dressings otherwise as well. Most recently has been using triple antibiotic ointment. With that being said unfortunately despite everything he really has not had any significant  improvement. He tells me that he cannot even really remember exactly how this began but he presumed it may have rubbed on his shoes or something of that nature. 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 He tells me that this does seem to help. Fortunately there is no signs of active infection at this time. His most recent hemoglobin A1c was 8.1 though he knows this was this year he cannot tell me the exact time. His fluid pills currently to help with some of the lower extremity edema although he does obviously have signs of venous stasis/lymphedema. Currently there is no evidence of active infection. No fevers, chills, nausea, vomiting, or diarrhea. Patient has had fairly recent ABIs which were performed on 07/19/2020 and revealed that he has normal findings in both the ankle and toe locations bilaterally. His ABI on the right was 1.09 on the left was 1.08 with a TBI on the right of 0.88 and on the left of 0.94. Triphasic flow was noted throughout. 10/08/2020 on evaluation today patient appears to be doing pretty well in regard to his left heel currently in fact this is doing a great job and seems to be healing quite nicely. Unfortunately on his right leg he had a pile of wood that actually fell on him injuring his right leg this is somewhat erythematous has me concerned little bit about cellulitis though there is not really a good area to culture at this point. 10/24/2020 upon evaluation today patient appears to be doing well with regard to his heel ulcer. He is showing signs of improvement which is great news. His right leg is completely healed. Overall I feel like he is doing excellent and there is no signs of  infection. 11/07/2020 upon evaluation  today patient appears to be doing well with regard to his heel ulcer. He tells me that last week when he was unable to come in his wife actually thought that the wound was very close to closing if not closed. Then it began to "reopen again". I really feel like what may have happened as the collagen may have dried over the wound bed and that because that misunderstanding with thinking that the wound was healing. With that being said I did not see it last week I do not know that for certain. Either way I feel like he is doing great today I see no signs of infection at this point. Objective Constitutional Well-nourished and well-hydrated in no acute distress. Vitals Time Taken: 11:12 AM, Height: 70 in, Weight: 262 lbs, BMI: 37.6, Temperature: 98.3 F, Pulse: 93 bpm, Respiratory Rate: 18 breaths/min, Blood Pressure: 110/63 mmHg. Respiratory normal breathing without difficulty. Psychiatric this patient is able to make decisions and demonstrates good insight into disease process. Alert and Oriented x 3. pleasant and cooperative. General Notes: Patient's wound bed not require sharp debridement at all and it seems to be doing quite well and very pleased in that regard Integumentary (Hair, Skin) Wound #1 status is Open. Original cause of wound was Gradually Appeared. The wound is located on the Left Calcaneus. The wound measures 0.3cm length x 1.5cm width x 0.2cm depth; 0.353cm^2 area and 0.071cm^3 volume. There is Fat Layer (Subcutaneous Tissue) exposed. There is no tunneling or undermining noted. There is a small amount of serous drainage noted. The wound margin is flat and intact. There is large (67- 100%) pink, pale granulation within the wound bed. There is no necrotic tissue within the wound bed. 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 Edema Control - Lymphedema / Segmental Compressive Device / Other: Wound #1 Left Calcaneus: Patient to wear own compression stockings. Elevate legs to the level of the heart and pump ankles as often as possible WOUND #1: - Calcaneus Wound Laterality: Left Cleanser: Byram Ancillary Kit - 15 Day Supply (DME) (Generic) 3 x Per Week/30 Days Discharge Instructions: Use supplies as instructed; Kit contains: (15) Saline Bullets; (15) 3x3 Gauze; 15 pr Gloves Cleanser: Normal Saline (Generic) 3 x Per Week/30 Days Discharge Instructions: Wash your hands with soap and water. Remove old dressing, discard into plastic bag and place into trash. Cleanse the wound with Normal Saline prior to applying a clean dressing using gauze sponges, not tissues or cotton balls. Do not scrub or use excessive force. Pat dry using gauze sponges, not tissue or cotton balls. Primary Dressing: Prisma 4.34 (in) (DME) (Generic) 3 x Per Week/30 Days Discharge Instructions: Moisten w/normal saline or sterile water; Cover wound as directed. Do not remove from wound bed. Secondary Dressing: Mepilex Border Flex, 4x4 (in/in) (Generic) 3 x Per Week/30 Days Discharge Instructions: Apply to wound as directed. Do not cut. 1. Would recommend currently that we going continue with the wound care measures as before and the patient is in agreement with that plan. This includes the use of the silver collagen which I think is doing a great job. 2. I am also can recommend a large Band-Aid to cover he seems to be doing great with  this. 3. He should also be wearing his compression socks he does have edema this is good to help keep that edema under control. We will see patient back for reevaluation in 1 week here in the clinic. If  anything worsens or changes patient will contact our office for additional recommendations. Electronic Signature(s) Signed: 11/07/2020 12:00:08 PM By: [name] Keeler PA-C Entered By: [name] on 11/07/2020 12:00:08 [name], [name] ([id number]) -------------------------------------------------------------------------------- SuperBill Details Patient Name: [name], [name]. Date of Service: 11/07/2020 Medical Record Number: [id number] Patient Account Number: [id number] Date of Birth/Sex: 09-02-1946 (75 y.o. 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 Z95.4 Presence of other heart-valve replacement Z79.01 Long term (current) use of anticoagulants Facility Procedures CPT4 Code: 14103013 Description: 99213 - WOUND CARE VISIT-LEV 3 EST PT Modifier: Quantity: 1 Physician Procedures CPT4 Code: [id number] Description: 99213 - WC PHYS LEVEL 3 - EST PT Modifier: Quantity: 1 CPT4 Code: Description: ICD-10 Diagnosis Description E11.621 Type 2 diabetes mellitus with foot ulcer L89.623 Pressure ulcer of left heel, stage 3 L97.812 Non-pressure chronic ulcer of other part of right lower leg with fat la Z95.4 Presence of other heart-valve  replacement Modifier: yer exposed Quantity: Electronic Signature(s) Signed: 11/07/2020 12:00:23 PM By: [name] Keeler PA-C Entered By: [name] on 11/07/2020 12:00:23

## 2020-11-08 NOTE — Progress Notes (Signed)
KENI, ELISON ([id number]) Visit Report for 11/07/2020 Arrival Information Details Patient Name: [name], [name]. Date of Service: 11/07/2020 11:00 AM Medical Record Number: [id number] Patient Account Number: [id number] Date of Birth/Sex: 1946-03-14 (75 y.o. M) Treating RN: Carlene Coria Primary Care [name]: Fulton Reek Other Clinician: Referring Coulter Oldaker: Fulton Reek Treating Briseida Gittings/Extender: Skipper Cliche in Treatment: 6 Visit Information History Since Last Visit All ordered tests and consults were completed: No Patient Arrived: Ambulatory Added or deleted any medications: No Arrival Time: 11:12 Any new allergies or adverse reactions: No Accompanied By: self Had a fall or experienced change in No Transfer Assistance: None activities of daily living that may affect Patient Identification Verified: Yes risk of falls: Secondary Verification Process Completed: Yes Signs or symptoms of abuse/neglect since last visito No Patient Requires Transmission-Based Precautions: No Hospitalized since last visit: No Patient Has Alerts: Yes Implantable device outside of the clinic excluding No Patient Alerts: Type II Diabetic cellular tissue based products placed in the center 81 mg Aspirin since last visit: Warfarin Has Dressing in Place as Prescribed: Yes Pain Present Now: No Electronic Signature(s) Signed: 11/08/2020 10:25:07 AM By: Carlene Coria RN Entered By: Carlene Coria on 11/07/2020 11:12:36 [name] ([id number]) -------------------------------------------------------------------------------- Clinic Level of Care Assessment Details Patient Name: [name]. Date of Service: 11/07/2020 11:00 AM Medical Record Number: [id number] Patient Account Number: [id number] Date of Birth/Sex: [birth year] (75 y.o. M) Treating RN: Dolan Amen Primary Care Kmari Brian: Fulton Reek Other Clinician: Referring Yazmeen Woolf: Fulton Reek Treating Kessie Croston/Extender:  Skipper Cliche in Treatment: 6 Clinic Level of Care Assessment Items TOOL 4 Quantity Score X - Use when only an EandM is performed on FOLLOW-UP visit 1 0 ASSESSMENTS - Nursing Assessment / Reassessment X - Reassessment of Co-morbidities (includes updates in patient status) 1 10 X- 1 5 Reassessment of Adherence to Treatment Plan ASSESSMENTS - Wound and Skin Assessment / Reassessment X - Simple Wound Assessment / Reassessment - one wound 1 5 _0  - 0 Complex Wound Assessment / Reassessment - multiple wounds _1  - 0 Dermatologic / Skin Assessment (not related to wound area) ASSESSMENTS - Focused Assessment _2  - Circumferential Edema Measurements - multi extremities 0 _3  - 0 Nutritional Assessment / Counseling / Intervention _4  - 0 Lower Extremity Assessment (monofilament, tuning fork, pulses) _5  - 0 Peripheral Arterial Disease Assessment (using hand held doppler) ASSESSMENTS - Ostomy and/or Continence Assessment and Care _6  - Incontinence Assessment and Management 0 _7  - 0 Ostomy Care Assessment and Management (repouching, etc.) PROCESS - Coordination of Care X - Simple Patient / Family Education for ongoing care 1 15 _8  - 0 Complex (extensive) Patient / Family Education for ongoing care _9  - 0 Staff obtains Programmer, systems, Records, Test Results / Process Orders _10  - 0 Staff telephones HHA, Nursing Homes / Clarify orders / etc _11  - 0 Routine Transfer to another Facility (non-emergent condition) _12  - 0 Routine Hospital Admission (non-emergent condition) _13  - 0 New Admissions / Biomedical engineer / Ordering NPWT, Apligraf, etc. _14  - 0 Emergency Hospital Admission (emergent condition) X- 1 10 Simple Discharge Coordination _15  - 0 Complex (extensive) Discharge Coordination PROCESS - Special Needs _16  - Pediatric / Minor Patient Management 0 _17  - 0 Isolation Patient Management _18  - 0 Hearing / Language / Visual special needs _19  - 0 Assessment of Community assistance  (transportation, D/C planning, etc.) _20  - 0 Additional assistance / Altered mentation _21  - 0 Support Surface(s) Assessment (bed, cushion, seat, etc.) INTERVENTIONS - Wound Cleansing / Measurement Precious Bard  J. ([id number]) X- 1 5 Simple Wound Cleansing - one wound _0  - 0 Complex Wound Cleansing - multiple wounds X- 1 5 Wound Imaging (photographs - any number of wounds) _1  - 0 Wound Tracing (instead of photographs) X- 1 5 Simple Wound Measurement - one wound _2  - 0 Complex Wound Measurement - multiple wounds INTERVENTIONS - Wound Dressings _3  - Small Wound Dressing one or multiple wounds 0 X- 1 15 Medium Wound Dressing one or multiple wounds _4  - 0 Large Wound Dressing one or multiple wounds <NWGNFAOZHYQMVHQI>_6<\/NGEXBMWUXLKGMWNU>_2  - 0 Application of Medications - topical <VOZDGUYQIHKVQQVZ>_5<\/GLOVFIEPPIRJJOAC>_1  - 0 Application of Medications - injection INTERVENTIONS - Miscellaneous _7  - External ear exam 0 _8  - 0 Specimen Collection (cultures, biopsies, blood, body fluids, etc.) _9  - 0 Specimen(s) / Culture(s) sent or taken to Lab for analysis _10  - 0 Patient Transfer (multiple staff / Civil Service fast streamer / Similar devices) _11  - 0 Simple Staple / Suture removal (25 or less) _12  - 0 Complex Staple / Suture removal (26 or more) _13  - 0 Hypo / Hyperglycemic Management (close monitor of Blood Glucose) _14  - 0 Ankle / Brachial Index (ABI) - do not check if billed separately X- 1 5 Vital Signs Has the patient been seen at the hospital within the last three years: Yes Total Score: 80 Level Of Care: New/Established - Level 3 Electronic Signature(s) Signed: 11/07/2020 11:57:33 AM By: Georges Mouse, Minus Breeding RN Entered By: Georges Mouse, Minus Breeding on 11/07/2020 11:36:58 [name] ([id number]) -------------------------------------------------------------------------------- Encounter Discharge Information Details Patient Name: [name], [name]. Date of Service: 11/07/2020 11:00 AM Medical Record Number: [id number] Patient Account Number:  [id number] Date of Birth/Sex: 1945/12/04 (75 y.o. M) Treating RN: Dolan Amen Primary Care Jahzaria Vary: Fulton Reek Other Clinician: Referring Cederick Broadnax: Fulton Reek Treating Aldeen Riga/Extender: Skipper Cliche in Treatment: 6 Encounter Discharge Information Items Discharge Condition: Stable Ambulatory Status: Cane Discharge Destination: Home Transportation: Private Auto Accompanied By: self Schedule Follow-up Appointment: Yes Clinical Summary of Care: Electronic Signature(s) Signed: 11/07/2020 11:57:33 AM By: Georges Mouse, Minus Breeding RN Entered By: Georges Mouse, Minus Breeding on 11/07/2020 11:42:01 [name] ([id number]) -------------------------------------------------------------------------------- Lower Extremity Assessment Details Patient Name: [name], [name]. Date of Service: 11/07/2020 11:00 AM Medical Record Number: [id number] Patient Account Number: [id number] Date of Birth/Sex: [birth year] (75 y.o. M) Treating RN: Carlene Coria Primary Care Jalyssa Fleisher: Fulton Reek Other Clinician: Referring Blair Lundeen: Fulton Reek Treating Doylene Splinter/Extender: Skipper Cliche in Treatment: 6 Edema Assessment Assessed: [Left: No] [Right: No] [Left: Edema] [Right: :] Calf Left: Right: Point of Measurement: From Medial Instep 41 cm Ankle Left: Right: Point of Measurement: From Medial Instep 25 cm Vascular Assessment Pulses: Dorsalis Pedis Palpable: [Left:Yes] Electronic Signature(s) Signed: 11/08/2020 10:25:07 AM By: Carlene Coria RN Entered By: Carlene Coria on 11/07/2020 11:20:05 [name] ([id number]) -------------------------------------------------------------------------------- Multi Wound Chart Details Patient Name: [name]. Date of Service: 11/07/2020 11:00 AM Medical Record Number: [id number] Patient Account Number: [id number] Date of Birth/Sex: January 04, 1946 (75 y.o. 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 N/A N/A Comorbid History: Cataracts, Arrhythmia, Coronary N/A N/A Artery Disease, Hypertension, Type II Diabetes, Osteoarthritis, Neuropathy Date Acquired: 09/24/1998 N/A N/A [place]  of Treatment: 6 N/A N/A Wound Status: Open N/A N/A Measurements L x W x D (cm) 0.3x1.5x0.2 N/A N/A Area (cm) : 0.353 N/A N/A Volume (cm) : 0.071 N/A N/A % Reduction in Area: 91.40% N/A N/A % Reduction in Volume: 82.80% N/A N/A Classification: Grade 2 N/A N/A Exudate Amount: Small N/A N/A Exudate Type: Serous N/A N/A Exudate Color: amber N/A N/A Wound Margin: Flat and Intact N/A N/A Granulation Amount: Large (67-100%) N/A N/A Granulation Quality: Pink, Pale N/A N/A Necrotic Amount: None Present (0%) N/A N/A Exposed Structures: Fat Layer (Subcutaneous Tissue): N/A N/A Yes Fascia: No Tendon: No Muscle: No Joint: No Bone: No Epithelialization: None N/A N/A Treatment Notes Electronic Signature(s) Signed: 11/07/2020 11:57:33 AM By: Georges Mouse, Minus Breeding RN Entered By: Georges Mouse, Minus Breeding on 11/07/2020 11:27:16 KHA, HARI ([id number], MILLES ([id number]) -------------------------------------------------------------------------------- Multi-Disciplinary Care Plan Details Patient Name: [name], [name]. Date of Service: 11/07/2020 11:00 AM Medical Record Number: [id number] Patient Account Number: [id number] Date of Birth/Sex: August 09, 1946 (75 y.o. M) Treating RN: Dolan Amen Primary Care  Tamari Busic: Fulton Reek Other Clinician: Referring Ashton Sabine: Fulton Reek Treating Zhane Donlan/Extender: Skipper Cliche in Treatment: 6 Active Inactive Peripheral Neuropathy Nursing Diagnoses: Knowledge deficit related to disease process and management of peripheral neurovascular dysfunction Potential alteration in peripheral tissue perfusion (select prior to confirmation of diagnosis) Goals: Patient/caregiver will verbalize understanding of disease process and disease management Date Initiated: 09/24/2020 Target Resolution Date: 10/22/2020 Goal Status: Active Interventions: Assess signs and symptoms of neuropathy upon admission and as needed Provide education on Management of Neuropathy and Related Ulcers Notes: Pressure Nursing Diagnoses: Knowledge deficit related to causes and risk factors for pressure ulcer development Knowledge deficit related to management of pressures ulcers Goals: Patient will remain free from development of additional pressure ulcers Date Initiated: 09/24/2020 Target Resolution Date: 10/23/2020 Goal Status: Active Patient will remain free of pressure ulcers Date Initiated: 09/24/2020 Target Resolution Date: 10/22/2020 Goal Status: Active Interventions: Assess: immobility, friction, shearing, incontinence upon admission and as needed Assess offloading mechanisms upon admission and as needed Notes: Wound/Skin Impairment Nursing Diagnoses: Impaired tissue integrity Goals: Ulcer/skin breakdown will have a volume reduction of 30% by week 4 Date Initiated: 09/24/2020 Target Resolution Date: 10/25/2020 Goal Status: Active Ulcer/skin breakdown will have a volume reduction of 50% by week 8 Date Initiated: 09/24/2020 Target Resolution Date: 11/25/2020 Goal Status: Active Ulcer/skin breakdown will have a volume reduction of 80% by week 12 Date Initiated: 09/24/2020 Target Resolution Date: 12/23/2020 Goal Status: Active Ulcer/skin breakdown will heal within 14  weeks [name], [name] ([id number]) Date Initiated: 09/24/2020 Target Resolution Date: 01/13/2021 Goal Status: Active Interventions: Assess patient/caregiver ability to obtain necessary supplies Assess patient/caregiver ability to perform ulcer/skin care regimen upon admission and as needed Treatment Activities: Referred to DME Raylie Maddison for dressing supplies : 09/24/2020 Skin care regimen initiated : 09/24/2020 Topical wound management initiated : 09/24/2020 Notes: Electronic Signature(s) Signed: 11/07/2020 11:57:33 AM By: Georges Mouse, Minus Breeding RN Entered By: Georges Mouse, Minus Breeding on 11/07/2020 11:27:07 [name] ([id number]) -------------------------------------------------------------------------------- Pain Assessment Details Patient Name: [name], [name]. Date of Service: 11/07/2020 11:00 AM Medical Record Number: [id number] Patient Account Number: [id number] Date of Birth/Sex: [birth year] (74 y.o. M) Treating RN: Carlene Coria Primary Care Liandra Mendia: Fulton Reek Other Clinician: Referring Jemuel Laursen: Fulton Reek Treating Khiya Friese/Extender: Skipper Cliche in Treatment: 6 Active Problems Location of Pain Severity and Description of Pain Patient Has Paino No Site Locations Pain Management and Medication Current Pain Management: Electronic Signature(s) Signed: 11/08/2020 10:25:07 AM By:  Carlene Coria RN Entered By: Carlene Coria on 11/07/2020 11:13:11 [name] ([id number]) -------------------------------------------------------------------------------- Patient/Caregiver Education Details Patient Name: [name], [name]. Date of Service: 11/07/2020 11:00 AM Medical Record Number: [id number] Patient Account Number: [id number] Date of Birth/Gender: July 03, 1946 (75 y.o. M) Treating RN: Dolan Amen Primary Care Physician: Fulton Reek Other Clinician: Referring Physician: Fulton Reek Treating Physician/Extender: Skipper Cliche in Treatment:  6 Education Assessment Education Provided To: Patient Education Topics Provided Offloading: Methods: Explain/Verbal Responses: State content correctly Notes Reinforced need to continue wearing compression stockings Electronic Signature(s) Signed: 11/07/2020 11:57:33 AM By: Georges Mouse, Minus Breeding RN Entered By: Georges Mouse, Minus Breeding on 11/07/2020 11:37:52 [name] ([id number]) -------------------------------------------------------------------------------- Wound Assessment Details Patient Name: [name], [name]. Date of Service: 11/07/2020 11:00 AM Medical Record Number: [id number] Patient Account Number: [id number] Date of Birth/Sex: 04/06/1946 (75 y.o. M) Treating RN: Carlene Coria Primary Care Michial Disney: Fulton Reek Other Clinician: Referring Guerry Covington: Fulton Reek Treating Walda Hertzog/Extender: Skipper Cliche in Treatment: 6 Wound Status Wound Number: 1 Primary Diabetic Wound/Ulcer of the Lower Extremity Etiology: Wound Location: Left Calcaneus Secondary Pressure Ulcer Wounding Event: Gradually Appeared Etiology: Date Acquired: 09/24/1998 Wound Open Weeks Of Treatment: 6 Status: Clustered Wound: No Comorbid Cataracts, Arrhythmia, Coronary Artery Disease, History: Hypertension, Type II Diabetes, Osteoarthritis, Neuropathy Photos Wound Measurements Length: (cm) 0.3 % Red Width: (cm) 1.5 % Red Depth: (cm) 0.2 Epith Area: (cm) 0.353 Tunn Volume: (cm) 0.071 Unde uction in Area: 91.4% uction in Volume: 82.8% elialization: None eling: No rmining: No Wound Description Classification: Grade 2 Foul Wound Margin: Flat and Intact Slou Exudate Amount: Small Exudate Type: Serous Exudate Color: amber Odor After Cleansing: No gh/Fibrino Yes Wound Bed Granulation Amount: Large (67-100%) Exposed Structure Granulation Quality: Pink, Pale Fascia Exposed: No Necrotic Amount: None Present (0%) Fat Layer (Subcutaneous Tissue) Exposed: Yes Tendon Exposed:  No Muscle Exposed: No Joint Exposed: No Bone Exposed: No Treatment Notes Wound #1 (Calcaneus) Wound Laterality: Left Cleanser [place] [facility] - [address] [name], [name] ([id number]) Discharge Instruction: Use supplies as instructed; Kit contains: (15) Saline Bullets; (15) 3x3 Gauze; 15 pr Gloves Normal Saline Discharge Instruction: Wash your hands with soap and water. Remove old dressing, discard into plastic bag and place into trash. Cleanse the wound with Normal Saline prior to applying a clean dressing using gauze sponges, not tissues or cotton balls. Do not scrub or use excessive force. Pat dry using gauze sponges, not tissue or cotton balls. Peri-Wound Care Topical Primary Dressing Prisma 4.34 (in) Discharge Instruction: Moisten w/normal saline or sterile water; Cover wound as directed. Do not remove from wound bed. Secondary Dressing Mepilex Border Flex, 4x4 (in/in) Discharge Instruction: Apply to wound as directed. Do not cut. Secured With Compression Wrap Compression Stockings Add-Ons Electronic Signature(s) Signed: 11/08/2020 10:25:07 AM By: Carlene Coria RN Entered By: Carlene Coria on 11/07/2020 11:19:21 [name], CUMBIE ([id number]) -------------------------------------------------------------------------------- Vitals Details Patient Name: [name], [name]. Date of Service: 11/07/2020 11:00 AM Medical Record Number: [id number] Patient Account Number: [id number] Date of Birth/Sex: 03-May-1946 (75 y.o. M) Treating RN: Carlene Coria Primary Care Julieanne Hadsall: Fulton Reek Other Clinician: Referring Casmira Cramer: Fulton Reek Treating Wilmont Olund/Extender: Skipper Cliche in Treatment: 6 Vital Signs Time Taken: 11:12 Temperature (F): 98.3 Height (in): 70 Pulse (bpm): 93 Weight (lbs): 262 Respiratory Rate (breaths/min): 18 Body Mass Index (BMI): 37.6 Blood Pressure (mmHg): 110/63 Reference Range: 80 - 120 mg / dl Electronic Signature(s) Signed:  11/08/2020 10:25:07 AM By: Carlene Coria RN Entered By: Carlene Coria on 11/07/2020 11:13:04

## 2020-11-21 ENCOUNTER — Ambulatory Visit: Payer: Medicare HMO | Admitting: Physician Assistant

## 2020-11-26 ENCOUNTER — Encounter: Payer: Medicare HMO | Attending: Physician Assistant | Admitting: Physician Assistant

## 2020-11-26 ENCOUNTER — Other Ambulatory Visit: Payer: Self-pay

## 2020-11-26 DIAGNOSIS — L89629 Pressure ulcer of left heel, unspecified stage: Secondary | ICD-10-CM | POA: Diagnosis present

## 2020-11-26 DIAGNOSIS — E11621 Type 2 diabetes mellitus with foot ulcer: Secondary | ICD-10-CM | POA: Diagnosis not present

## 2020-11-26 DIAGNOSIS — Z7901 Long term (current) use of anticoagulants: Secondary | ICD-10-CM | POA: Insufficient documentation

## 2020-11-26 DIAGNOSIS — L97812 Non-pressure chronic ulcer of other part of right lower leg with fat layer exposed: Secondary | ICD-10-CM | POA: Insufficient documentation

## 2020-11-26 DIAGNOSIS — L89623 Pressure ulcer of left heel, stage 3: Secondary | ICD-10-CM | POA: Insufficient documentation

## 2020-11-26 DIAGNOSIS — Z952 Presence of prosthetic heart valve: Secondary | ICD-10-CM | POA: Diagnosis not present

## 2020-11-26 NOTE — Progress Notes (Signed)
[name], [name] ([id number]) Visit Report for 11/26/2020 Chief Complaint Document Details Patient Name: [name], [name]. Date of Service: 11/26/2020 11:00 AM Medical Record Number: [id number] Patient Account Number: [id number] Date of Birth/Sex: 1946/01/20 (75 y.o. Male) Treating RN: Dolan Amen Primary Care Provider: Fulton Reek Other Clinician: Referring Provider: Fulton Reek Treating Provider/Extender: Skipper Cliche in Treatment: 9 Information Obtained from: Patient Chief Complaint Left heel pressure ulcer and Right LE traumatic ulcers Electronic Signature(s) Signed: 11/26/2020 11:28:57 AM By: Worthy Keeler PA-C Entered By: Worthy Keeler on 11/26/2020 11:28:57 [name], [name] ([id number]) -------------------------------------------------------------------------------- HPI Details Patient Name: [name], [name]. Date of Service: 11/26/2020 11:00 AM Medical Record Number: [id number] Patient Account Number: [id number] Date of Birth/Sex: 01/07/1946 (75 y.o. Male) Treating RN: Dolan Amen Primary Care Provider: Fulton Reek Other Clinician: Referring Provider: Fulton Reek Treating Provider/Extender: Skipper Cliche in Treatment: 9 History of Present Illness HPI Description: 09/24/2020 on evaluation today patient presents today for a heel ulcer that he tells me has been present for about 2 years. He has been seeing podiatry and they have been attempting to manage this including what sounds to be a total contact cast, Unna boot, and just standard dressings otherwise as well. Most recently has been using triple antibiotic ointment. With that being said unfortunately despite everything he really has not had any significant improvement. He tells me that he cannot even really remember exactly how this began but he presumed it may have rubbed on his shoes or something of that nature. 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 He tells me that this does seem to help. Fortunately there is no signs of active infection at this time. His most recent hemoglobin A1c was 8.1 though he knows this was this year he cannot tell me the exact time. His fluid pills currently to help with some of the lower extremity edema although he does obviously have signs of venous stasis/lymphedema. Currently there is no evidence of active infection. No fevers, chills, nausea, vomiting, or diarrhea. Patient has had fairly recent ABIs which were performed on 07/19/2020 and revealed that he has normal findings in both the ankle and toe locations bilaterally. His ABI on the right was 1.09 on the left was 1.08 with a TBI on the right of 0.88 and on the left of 0.94. Triphasic flow was noted throughout. 10/08/2020 on evaluation today patient appears to be doing pretty well in regard to his left heel currently in fact this is doing a great job and seems to be healing quite nicely. Unfortunately on his right leg he had a pile of wood that actually fell on him injuring his right leg this is somewhat erythematous has me concerned little bit about cellulitis though there is not really a good area to culture at this point. 10/24/2020 upon evaluation today patient appears to be doing well with regard to his heel ulcer. He is showing signs of improvement which is great news. His right leg is completely healed. Overall I feel like he is doing excellent and there is no signs of infection. 11/07/2020 upon evaluation today patient appears to be doing well with regard to his heel ulcer. He tells me that last week when  he was unable to come in his wife actually thought that the wound was very close to closing if not closed. Then it  began to "reopen again". I really feel like what may have happened as the collagen may have dried over the wound bed and that because that misunderstanding with thinking that the wound was healing. With that being said I did not see it last week I do not know that for certain. Either way I feel like he is doing great today I see no signs of infection at this point. 11/26/2020 upon inspection today patient appears to be doing decently well in regard to his heel ulcer. He has been tolerating the dressing changes without complication. Fortunately there is no sign of active infection at this time. No fevers, chills, nausea, vomiting, or diarrhea. Electronic Signature(s) Signed: 11/26/2020 1:30:54 PM By: Worthy Keeler PA-C Entered By: Worthy Keeler on 11/26/2020 13:30:53 [name], [name] ([id number]) -------------------------------------------------------------------------------- Physical Exam Details Patient Name: [name], [name] Date of Service: 11/26/2020 11:00 AM Medical Record Number: [id number] Patient Account Number: [id number] Date of Birth/Sex: 1946-01-26 (75 y.o. Male) Treating RN: Dolan Amen Primary Care Provider: Fulton Reek Other Clinician: Referring Provider: Fulton Reek Treating Provider/Extender: Skipper Cliche in Treatment: 63 Constitutional Well-nourished and well-hydrated in no acute distress. Respiratory normal breathing without difficulty. Psychiatric this patient is able to make decisions and demonstrates good insight into disease process. Alert and Oriented x 3. pleasant and cooperative. Notes Patient's wound bed actually showed signs of good granulation epithelization. There overall seems to be good evidence of the patient making excellent progress currently with regard to his wound it was a little macerated today but he had put some triple antibiotic ointment followed by a Band-Aid over the area and again I think this caused it to be just a little bit  too moist. I do not believe that he needs to be adding any ointment to the region I think the collagen is much better. Electronic Signature(s) Signed: 11/26/2020 1:31:18 PM By: Worthy Keeler PA-C Entered By: Worthy Keeler on 11/26/2020 13:31:18 [name], [name] ([id number]) -------------------------------------------------------------------------------- Physician Orders Details Patient Name: [name], [name]. Date of Service: 11/26/2020 11:00 AM Medical Record Number: [id number] Patient Account Number: [id number] Date of Birth/Sex: 06-14-1946 (75 y.o. 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 Edema Control - Lymphedema / Segmental Compressive Device / Other Wound #1 Left Calcaneus o Elevate legs to the level of the heart and pump ankles as often as possible Wound Treatment Wound #1 - Calcaneus Wound Laterality: Left Cleanser: Byram Ancillary Kit - 15 Day Supply (Generic) 3 x Per Week/30 Days Discharge Instructions: Use supplies as instructed; Kit contains: (15) Saline Bullets; (15) 3x3 Gauze; 15 pr Gloves Cleanser: Normal Saline (Generic) 3 x Per Week/30 Days Discharge Instructions: Wash your hands with soap and water. Remove old dressing, discard into plastic bag and place into trash. Cleanse the wound with Normal Saline prior to applying a clean dressing using gauze sponges, not tissues or cotton balls. Do not  scrub or use  excessive force. Pat dry using gauze sponges, not tissue or cotton balls. Primary Dressing: Prisma 4.34 (in) (Generic) 3 x Per Week/30 Days Discharge Instructions: Moisten w/normal saline or sterile water; Cover wound as directed. Do not remove from wound bed. Secondary Dressing: Mepilex Border Flex, 4x4 (in/in) (Generic) 3 x Per Week/30 Days Discharge Instructions: Apply to wound as directed. Do not cut. Electronic Signature(s) Signed: 11/26/2020 6:05:44 PM By: Worthy Keeler PA-C Signed: 11/27/2020 9:44:48 AM By: Carlene Coria RN Entered By: Carlene Coria on 11/26/2020 11:48:33 [name], [name] ([id number]) -------------------------------------------------------------------------------- Problem List Details Patient Name: [name], [name]. Date of Service: 11/26/2020 11:00 AM Medical Record Number: [id number] Patient Account Number: [id number] Date of Birth/Sex: 1946/07/09 (75 y.o. 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 Date of Service: 11/26/2020 11:00 AM Medical Record  Number: [id number] Patient Account Number: [id number] Date of Birth/Sex: [birth year] (75 y.o. Male) Treating RN: Dolan Amen Primary Care Provider: Fulton Reek Other Clinician: Referring Provider: Fulton Reek Treating Provider/Extender: Skipper Cliche in Treatment: 9 Subjective Chief Complaint Information obtained from Patient Left heel pressure ulcer and Right LE traumatic ulcers History of Present Illness (HPI) 09/24/2020 on evaluation today patient presents today for a heel ulcer that he tells me has been present for about 2 years. He has been seeing podiatry and they have been attempting to manage this including what sounds to be a total contact cast, Unna boot, and just standard dressings otherwise as well. Most recently has been using triple antibiotic ointment. With that being said unfortunately despite everything he really has not had any significant improvement. He tells me that he cannot even really remember exactly how this began but he presumed it may have rubbed on his shoes or something of that nature. 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 He tells me that this does seem to help. Fortunately there is no signs of active infection at this time. His most recent hemoglobin A1c was 8.1 though he knows this was this year he cannot tell me the exact time. His fluid pills currently to help with some of the lower extremity edema although he does obviously have signs of venous stasis/lymphedema. Currently there is no evidence of active infection. No fevers, chills, nausea, vomiting, or diarrhea. Patient has had fairly recent ABIs which were performed on 07/19/2020 and revealed that he has normal  findings in both the ankle and toe locations bilaterally. His ABI on the right was 1.09 on the left was 1.08 with  a TBI on the right of 0.88 and on the left of 0.94. Triphasic flow was noted throughout. 10/08/2020 on evaluation today patient appears to be doing pretty well in regard to his left heel currently in fact this is doing a great job and seems to be healing quite nicely. Unfortunately on his right leg he had a pile of wood that actually fell on him injuring his right leg this is somewhat erythematous has me concerned little bit about cellulitis though there is not really a good area to culture at this point. 10/24/2020 upon evaluation today patient appears to be doing well with regard to his heel ulcer. He is showing signs of improvement which is great news. His right leg is completely healed. Overall I feel like he is doing excellent and there is no signs of infection. 11/07/2020 upon evaluation today patient appears to be doing well with regard to his heel ulcer. He tells me that last week when he was unable to come in his wife actually thought that the wound was very close to closing if not closed. Then it began to "reopen again". I really feel like what may have happened as the collagen may have dried over the wound bed and that because that misunderstanding with thinking that the wound was healing. With that being said I did not see it last week I do not know that for certain. Either way I feel like he is doing great today I see no signs of infection at this point. 11/26/2020 upon inspection today patient appears to be doing decently well in regard to his heel ulcer. He has been tolerating the dressing changes without complication. Fortunately there is no sign of active infection at this time. No fevers, chills, nausea, vomiting, or diarrhea. Objective Constitutional Well-nourished and well-hydrated in no acute distress. Vitals Time Taken: 11:40 AM, Height: 70 in, Weight: 262 lbs, BMI:  37.6, Temperature: 98.2 F, Pulse: 83 bpm, Respiratory Rate: 18 breaths/min, Blood Pressure: 104/69 mmHg. Respiratory normal breathing without difficulty. Psychiatric this patient is able to make decisions and demonstrates good insight into disease process. Alert and Oriented x 3. pleasant and cooperative. General Notes: Patient's wound bed actually showed signs of good granulation epithelization. There overall seems to be good evidence of the patient making excellent progress currently with regard to his wound it was a little macerated today but he had put some triple antibiotic ointment followed by a Band-Aid over the area and again I think this caused it to be just a little bit too moist. I do not believe that he needs to be adding any ointment to the region I think the collagen is much better. [name], [name] ([id number]) Integumentary (Hair, Skin) Wound #1 status is Open. Original cause of wound was Gradually Appeared. The wound is located on the Left Calcaneus. The wound measures 0.4cm length x 1.1cm width x 0.2cm depth; 0.346cm^2 area and 0.069cm^3 volume. There is Fat Layer (Subcutaneous Tissue) exposed. There is no tunneling or undermining noted. There is a small amount of serous drainage noted. The wound margin is flat and intact. There is large (67- 100%) pink, pale granulation within the wound bed. There is no necrotic tissue within the wound bed. 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 Edema Control - Lymphedema / Segmental Compressive Device / Other: Wound #1 Left Calcaneus: Elevate legs to the level of the heart and pump ankles as often as possible WOUND #1: -  Calcaneus Wound Laterality: Left Cleanser: Byram Ancillary Kit - 15 Day Supply (Generic) 3 x Per Week/30 Days Discharge Instructions: Use supplies as instructed; Kit contains: (15) Saline Bullets; (15) 3x3 Gauze; 15 pr Gloves Cleanser: Normal Saline (Generic) 3 x Per Week/30 Days Discharge Instructions: Wash your hands with soap and water. Remove old dressing, discard into plastic bag and place into trash. Cleanse the wound with Normal Saline prior to applying a clean dressing using gauze sponges, not tissues or cotton balls. Do not scrub or use excessive force. Pat dry using gauze sponges, not tissue or cotton balls. Primary Dressing: Prisma 4.34 (in) (Generic) 3 x Per Week/30 Days Discharge Instructions: Moisten w/normal saline or sterile water; Cover wound as directed. Do not remove from wound bed. Secondary Dressing: Mepilex Border Flex, 4x4 (in/in) (Generic) 3 x Per Week/30 Days Discharge Instructions: Apply to wound as directed. Do not cut. 1. Upon inspection patient's wound bed actually showed signs of good granulation at this time. There does not appear to be any evidence of active infection which is great news overall I think he is making good progress. We will continue with the collagen for that reason. 2. I am also can recommend the patient continue to elevate his leg when he is sitting so that he avoids pressure to the heel location. I think that still of utmost importance. We will see patient back for reevaluation in 1 week here in the clinic. If anything worsens or changes patient will contact our office for additional recommendations. Electronic Signature(s) Signed: 11/26/2020 1:31:50 PM By: Worthy Keeler PA-C Entered By: Worthy Keeler on 11/26/2020 13:31:50 [name], [name] ([id number]) -------------------------------------------------------------------------------- SuperBill Details Patient Name: [name], [name]. Date of Service: 11/26/2020 Medical Record Number:  [id number] Patient Account Number: [id number] Date of Birth/Sex: 28-Mar-1946 (75 y.o. 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 Z95.4 Presence of other heart-valve replacement Z79.01 Long term (current) use of anticoagulants Facility Procedures CPT4 Code: [id number] Description: 99213 - WOUND CARE VISIT-LEV 3 EST PT Modifier: Quantity: 1 Physician Procedures CPT4 Code: 2500370 Description: 99213 - WC PHYS LEVEL 3 - EST PT Modifier: Quantity: 1 CPT4 Code: Description: ICD-10 Diagnosis Description 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

## 2020-11-26 NOTE — Progress Notes (Addendum)
DEYON, CHIZEK ([id number]) Visit Report for 11/26/2020 Arrival Information Details Patient Name: [name], [name]. Date of Service: 11/26/2020 11:00 AM Medical Record Number: [id number] Patient Account Number: [id number] Date of Birth/Sex: June 17, 1946 (75 y.o. Male) Treating RN: Carlene Coria Primary Care Kseniya Grunden: Fulton Reek Other Clinician: Referring Xavion Muscat: Fulton Reek Treating Corliss Coggeshall/Extender: Skipper Cliche in Treatment: 9 Visit Information History Since Last Visit All ordered tests and consults were completed: No Patient Arrived: Ambulatory Added or deleted any medications: No Arrival Time: 11:39 Any new allergies or adverse reactions: No Accompanied By: self Had a fall or experienced change in No Transfer Assistance: None activities of daily living that may affect Patient Identification Verified: Yes risk of falls: Secondary Verification Process Completed: Yes Signs or symptoms of abuse/neglect since last visito No Patient Requires Transmission-Based Precautions: No Hospitalized since last visit: No Patient Has Alerts: Yes Implantable device outside of the clinic excluding No Patient Alerts: Type II Diabetic cellular tissue based products placed in the center 81 mg Aspirin since last visit: Warfarin Has Dressing in Place as Prescribed: Yes Pain Present Now: No Electronic Signature(s) Signed: 11/27/2020 9:44:48 AM By: Carlene Coria RN Entered By: Carlene Coria on 11/26/2020 11:40:02 [name] ([id number]) -------------------------------------------------------------------------------- Clinic Level of Care Assessment Details Patient Name: [name]. Date of Service: 11/26/2020 11:00 AM Medical Record Number: [id number] Patient Account Number: [id number] Date of Birth/Sex: July 10, 1946 (75 y.o. Male) Treating RN: Carlene Coria Primary Care Lexander Tremblay: Fulton Reek Other Clinician: Referring Hazell Siwik: Fulton Reek Treating Riggins Cisek/Extender:  Skipper Cliche in Treatment: 9 Clinic Level of Care Assessment Items TOOL 4 Quantity Score X - Use when only an EandM is performed on FOLLOW-UP visit 1 0 ASSESSMENTS - Nursing Assessment / Reassessment X - Reassessment of Co-morbidities (includes updates in patient status) 1 10 X- 1 5 Reassessment of Adherence to Treatment Plan ASSESSMENTS - Wound and Skin Assessment / Reassessment X - Simple Wound Assessment / Reassessment - one wound 1 5 '[]'  - 0 Complex Wound Assessment / Reassessment - multiple wounds '[]'  - 0 Dermatologic / Skin Assessment (not related to wound area) ASSESSMENTS - Focused Assessment '[]'  - Circumferential Edema Measurements - multi extremities 0 '[]'  - 0 Nutritional Assessment / Counseling / Intervention '[]'  - 0 Lower Extremity Assessment (monofilament, tuning fork, pulses) '[]'  - 0 Peripheral Arterial Disease Assessment (using hand held doppler) ASSESSMENTS - Ostomy and/or Continence Assessment and Care '[]'  - Incontinence Assessment and Management 0 '[]'  - 0 Ostomy Care Assessment and Management (repouching, etc.) PROCESS - Coordination of Care X - Simple Patient / Family Education for ongoing care 1 15 '[]'  - 0 Complex (extensive) Patient / Family Education for ongoing care X- 1 10 Staff obtains Programmer, systems, Records, Test Results / Process Orders '[]'  - 0 Staff telephones HHA, Nursing Homes / Clarify orders / etc '[]'  - 0 Routine Transfer to another Facility (non-emergent condition) '[]'  - 0 Routine Hospital Admission (non-emergent condition) '[]'  - 0 New Admissions / Biomedical engineer / Ordering NPWT, Apligraf, etc. '[]'  - 0 Emergency Hospital Admission (emergent condition) X- 1 10 Simple Discharge Coordination '[]'  - 0 Complex (extensive) Discharge Coordination PROCESS - Special Needs '[]'  - Pediatric / Minor Patient Management 0 '[]'  - 0 Isolation Patient Management '[]'  - 0 Hearing / Language / Visual special needs '[]'  - 0 Assessment of Community assistance  (transportation, D/C planning, etc.) '[]'  - 0 Additional assistance / Altered mentation '[]'  - 0 Support Surface(s) Assessment (bed, cushion, seat, etc.) INTERVENTIONS - Wound Cleansing / Measurement Precious Bard  J. ([id number]) X- 1 5 Simple Wound Cleansing - one wound '[]'  - 0 Complex Wound Cleansing - multiple wounds X- 1 5 Wound Imaging (photographs - any number of wounds) '[]'  - 0 Wound Tracing (instead of photographs) X- 1 5 Simple Wound Measurement - one wound '[]'  - 0 Complex Wound Measurement - multiple wounds INTERVENTIONS - Wound Dressings X - Small Wound Dressing one or multiple wounds 1 10 '[]'  - 0 Medium Wound Dressing one or multiple wounds '[]'  - 0 Large Wound Dressing one or multiple wounds X- 1 5 Application of Medications - topical '[]'  - 0 Application of Medications - injection INTERVENTIONS - Miscellaneous '[]'  - External ear exam 0 '[]'  - 0 Specimen Collection (cultures, biopsies, blood, body fluids, etc.) '[]'  - 0 Specimen(s) / Culture(s) sent or taken to Lab for analysis '[]'  - 0 Patient Transfer (multiple staff / Civil Service fast streamer / Similar devices) '[]'  - 0 Simple Staple / Suture removal (25 or less) '[]'  - 0 Complex Staple / Suture removal (26 or more) '[]'  - 0 Hypo / Hyperglycemic Management (close monitor of Blood Glucose) '[]'  - 0 Ankle / Brachial Index (ABI) - do not check if billed separately X- 1 5 Vital Signs Has the patient been seen at the hospital within the last three years: Yes Total Score: 90 Level Of Care: New/Established - Level 3 Electronic Signature(s) Signed: 11/27/2020 9:44:48 AM By: Carlene Coria RN Entered By: Carlene Coria on 11/26/2020 11:49:10 [name] ([id number]) -------------------------------------------------------------------------------- Encounter Discharge Information Details Patient Name: [name], [name]. Date of Service: 11/26/2020 11:00 AM Medical Record Number: [id number] Patient Account Number: [id number] Date of Birth/Sex:  1946-07-21 (75 y.o. Male) Treating RN: Carlene Coria Primary Care Ephram Kornegay: Fulton Reek Other Clinician: Referring Magen Suriano: Fulton Reek Treating Shahed Yeoman/Extender: Skipper Cliche in Treatment: 9 Encounter Discharge Information Items Discharge Condition: Stable Ambulatory Status: Ambulatory Discharge Destination: Home Transportation: Private Auto Accompanied By: self Schedule Follow-up Appointment: Yes Clinical Summary of Care: Patient Declined Electronic Signature(s) Signed: 11/27/2020 9:44:48 AM By: Carlene Coria RN Entered By: Carlene Coria on 11/26/2020 11:54:17 [name] ([id number]) -------------------------------------------------------------------------------- Lower Extremity Assessment Details Patient Name: [name], [name]. Date of Service: 11/26/2020 11:00 AM Medical Record Number: [id number] Patient Account Number: [id number] Date of Birth/Sex: [birth year] (75 y.o. Male) Treating RN: Carlene Coria Primary Care Nelson Julson: Fulton Reek Other Clinician: Referring Carlise Stofer: Fulton Reek Treating Keylah Darwish/Extender: Skipper Cliche in Treatment: 9 Edema Assessment Assessed: [Left: No] [Right: No] [Left: Edema] [Right: :] Calf Left: Right: Point of Measurement: 38 cm From Medial Instep 41 cm Ankle Left: Right: Point of Measurement: 9 cm From Medial Instep 23 cm Electronic Signature(s) Signed: 11/27/2020 9:44:48 AM By: Carlene Coria RN Entered By: Carlene Coria on 11/26/2020 11:45:25 [name] ([id number]) -------------------------------------------------------------------------------- Multi Wound Chart Details Patient Name: [name]. Date of Service: 11/26/2020 11:00 AM Medical Record Number: [id number] Patient Account Number: [id number] Date of Birth/Sex: 14-[name]-1946 (75 y.o. 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 N/A N/A Comorbid History: Cataracts, Arrhythmia, Coronary N/A N/A Artery Disease, Hypertension, Type II Diabetes, Osteoarthritis, Neuropathy Date Acquired: 09/24/1998 N/A N/A Weeks of Treatment: 9 N/A  N/A Wound Status: Open N/A N/A Measurements L x W x D (cm) 0.4x1.1x0.2 N/A N/A Area (cm) : 0.346 N/A N/A Volume (cm) : 0.069 N/A N/A % Reduction in Area: 91.60% N/A N/A % Reduction in Volume: 83.30% N/A N/A Classification: Grade 2 N/A N/A Exudate Amount: Small N/A N/A Exudate Type: Serous N/A N/A Exudate Color: amber N/A N/A Wound Margin: Flat and Intact N/A N/A Granulation Amount: Large (67-100%) N/A N/A Granulation Quality: Pink, Pale N/A N/A Necrotic Amount: None Present (0%) N/A N/A Exposed Structures: Fat Layer (Subcutaneous Tissue): N/A N/A Yes Fascia: No Tendon: No Muscle: No Joint: No Bone: No Epithelialization: None N/A N/A Treatment Notes Electronic Signature(s) Signed: 11/27/2020 9:44:48 AM By: Carlene Coria RN Entered By: Carlene Coria on 11/26/2020 11:48:00 [name] ([id number], GASKILL ([id number]) -------------------------------------------------------------------------------- Multi-Disciplinary Care Plan Details Patient Name: [name], [name]. Date of Service: 11/26/2020 11:00 AM Medical Record Number: [id number] Patient Account Number: [id number] Date of Birth/Sex: 19-Nov-1945 (75 y.o. Male) Treating RN: Carlene Coria Primary Care Drewey Begue: Fulton Reek Other Clinician: Referring Henretta Quist: Fulton Reek Treating Moe Brier/Extender:  Skipper Cliche in Treatment: 9 Active Inactive Wound/Skin Impairment Nursing Diagnoses: Impaired tissue integrity Goals: Ulcer/skin breakdown will have a volume reduction of 30% by week 4 Date Initiated: 09/24/2020 Date Inactivated: 11/26/2020 Target Resolution Date: 10/25/2020 Goal Status: Unmet Unmet Reason: comorbites Ulcer/skin breakdown will have a volume reduction of 50% by week 8 Date Initiated: 09/24/2020 Target Resolution Date: 11/25/2020 Goal Status: Active Ulcer/skin breakdown will have a volume reduction of 80% by week 12 Date Initiated: 09/24/2020 Target Resolution Date: 12/23/2020 Goal Status: Active Ulcer/skin breakdown will heal within 14 weeks Date Initiated: 09/24/2020 Target Resolution Date: 01/13/2021 Goal Status: Active Interventions: Assess patient/caregiver ability to obtain necessary supplies Assess patient/caregiver ability to perform ulcer/skin care regimen upon admission and as needed Treatment Activities: Referred to DME Neev Mcmains for dressing supplies : 09/24/2020 Skin care regimen initiated : 09/24/2020 Topical wound management initiated : 09/24/2020 Notes: Electronic Signature(s) Signed: 11/27/2020 9:44:48 AM By: Carlene Coria RN Entered By: Carlene Coria on 11/26/2020 11:47:41 [name] ([id number]) -------------------------------------------------------------------------------- Pain Assessment Details Patient Name: [name]. Date of Service: 11/26/2020 11:00 AM Medical Record Number: [id number] Patient Account Number: [id number] Date of Birth/Sex: Dec 08, 1945 (75 y.o. Male) Treating RN: Carlene Coria Primary Care Deven Furia: Fulton Reek Other Clinician: Referring Joron Velis: Fulton Reek Treating Oneika Simonian/Extender: Skipper Cliche in Treatment: 9 Active Problems Location of Pain Severity and Description of Pain Patient Has Paino No Site Locations Pain Management and Medication Current Pain Management: Electronic  Signature(s) Signed: 11/27/2020 9:44:48 AM By: Carlene Coria RN Entered By: Carlene Coria on 11/26/2020 11:43:25 [name] ([id number]) -------------------------------------------------------------------------------- Patient/Caregiver Education Details Patient Name: [name], [name]. Date of Service: 11/26/2020 11:00 AM Medical Record Number: [id number] Patient Account Number: [id number] Date of Birth/Gender: November 27, 1945 (75 y.o. Male) Treating RN: Carlene Coria Primary Care Physician: Fulton Reek Other Clinician: Referring Physician: Fulton Reek Treating Physician/Extender: Skipper Cliche in Treatment: 9 Education Assessment Education Provided To: Patient Education Topics Provided Wound/Skin Impairment: Methods: Explain/Verbal Responses: State content correctly Electronic Signature(s) Signed: 11/27/2020 9:44:48 AM By: Carlene Coria RN Entered By: Carlene Coria on 11/26/2020 11:53:23 [name] ([id number]) -------------------------------------------------------------------------------- Wound Assessment Details Patient Name: [name], [name]. Date of Service: 11/26/2020 11:00 AM Medical Record Number: [id number] Patient Account Number: [id number] Date of Birth/Sex: [birth year] (75 y.o. Male) Treating RN: Carlene Coria Primary Care Brittie Whisnant: Fulton Reek Other Clinician: Referring Krue Peterka: Fulton Reek Treating Katlynne Mckercher/Extender: Skipper Cliche in Treatment: 9  Wound Status Wound Number: 1 Primary Diabetic Wound/Ulcer of the Lower Extremity Etiology: Wound Location: Left Calcaneus Secondary Pressure Ulcer Wounding Event: Gradually Appeared Etiology: Date Acquired: 09/24/1998 Wound Open Weeks Of Treatment: 9 Status: Clustered Wound: No Comorbid Cataracts, Arrhythmia, Coronary Artery Disease, History: Hypertension, Type II Diabetes, Osteoarthritis, Neuropathy Photos Wound Measurements Length: (cm) 0.4 % Redu Width: (cm) 1.1 % Redu Depth: (cm) 0.2  Epithe Area: (cm) 0.346 Tunne Volume: (cm) 0.069 Under ction in Area: 91.6% ction in Volume: 83.3% lialization: None ling: No mining: No Wound Description Classification: Grade 2 Foul O Wound Margin: Flat and Intact Slough Exudate Amount: Small Exudate Type: Serous Exudate Color: amber dor After Cleansing: No /Fibrino Yes Wound Bed Granulation Amount: Large (67-100%) Exposed Structure Granulation Quality: Pink, Pale Fascia Exposed: No Necrotic Amount: None Present (0%) Fat Layer (Subcutaneous Tissue) Exposed: Yes Tendon Exposed: No Muscle Exposed: No Joint Exposed: No Bone Exposed: No Treatment Notes Wound #1 (Calcaneus) Wound Laterality: Left Cleanser [place] Ancillary Kit - [address] DANNY, [name] ([id number]) Discharge Instruction: Use supplies as instructed; Kit contains: (15) Saline Bullets; (15) 3x3 Gauze; 15 pr Gloves Normal Saline Discharge Instruction: Wash your hands with soap and water. Remove old dressing, discard into plastic bag and place into trash. Cleanse the wound with Normal Saline prior to applying a clean dressing using gauze sponges, not tissues or cotton balls. Do not scrub or use excessive force. Pat dry using gauze sponges, not tissue or cotton balls. Peri-Wound Care Topical Primary Dressing Prisma 4.34 (in) Discharge Instruction: Moisten w/normal saline or sterile water; Cover wound as directed. Do not remove from wound bed. Secondary Dressing Mepilex Border Flex, 4x4 (in/in) Discharge Instruction: Apply to wound as directed. Do not cut. Secured With Compression Wrap Compression Stockings Add-Ons Electronic Signature(s) Signed: 11/27/2020 9:44:48 AM By: Carlene Coria RN Entered By: Carlene Coria on 11/26/2020 11:44:27 [name], [name] ([id number]) -------------------------------------------------------------------------------- Vitals Details Patient Name: [name], [name]. Date of Service: 11/26/2020 11:00 AM Medical Record Number:  [id number] Patient Account Number: [id number] Date of Birth/Sex: 08-13-1946 (75 y.o. Male) Treating RN: Carlene Coria Primary Care Meghen Akopyan: Fulton Reek Other Clinician: Referring Tarisa Paola: Fulton Reek Treating Abbigail Anstey/Extender: Skipper Cliche in Treatment: 9 Vital Signs Time Taken: 11:40 Temperature (F): 98.2 Height (in): 70 Pulse (bpm): 83 Weight (lbs): 262 Respiratory Rate (breaths/min): 18 Body Mass Index (BMI): 37.6 Blood Pressure (mmHg): 104/69 Reference Range: 80 - 120 mg / dl Electronic Signature(s) Signed: 11/27/2020 9:44:48 AM By: Carlene Coria RN Entered By: Carlene Coria on 11/26/2020 11:40:21

## 2020-12-10 ENCOUNTER — Other Ambulatory Visit: Payer: Self-pay

## 2020-12-10 ENCOUNTER — Encounter: Payer: Medicare HMO | Admitting: Physician Assistant

## 2020-12-10 DIAGNOSIS — E11621 Type 2 diabetes mellitus with foot ulcer: Secondary | ICD-10-CM | POA: Diagnosis not present

## 2020-12-10 NOTE — Progress Notes (Addendum)
JAYMIN, [name] ([id number]) Visit Report for 12/10/2020 Chief Complaint Document Details Patient Name: [name], [name]. Date of Service: 12/10/2020 11:00 AM Medical Record Number: [id number] Patient Account Number: [id number] Date of Birth/Sex: 03-02-1946 (75 y.o. M) Treating RN: Dolan Amen Primary Care Provider: Fulton Reek Other Clinician: Referring Provider: Fulton Reek Treating Provider/Extender: Skipper Cliche in Treatment: 11 Information Obtained from: Patient Chief Complaint Left heel pressure ulcer and Right LE traumatic ulcers Electronic Signature(s) Signed: 12/10/2020 11:47:17 AM By: Worthy Keeler PA-C Entered By: Worthy Keeler on 12/10/2020 11:47:17 [name] ([id number]) -------------------------------------------------------------------------------- Debridement Details Patient Name: [name], [name]. Date of Service: 12/10/2020 11:00 AM Medical Record Number: [id number] Patient Account Number: [id number] Date of Birth/Sex: 1946/04/11 (75 y.o. M) Treating RN: Carlene Coria Primary Care Provider: Fulton Reek Other Clinician: Referring Provider: Fulton Reek Treating Provider/Extender: Skipper Cliche in Treatment: 11 Debridement Performed for Wound #1 Left Calcaneus Assessment: Performed By: Physician Tommie Sams., PA-C Debridement Type: Debridement Severity of Tissue Pre Debridement: Fat layer exposed Level of Consciousness (Pre- Awake and Alert procedure): Pre-procedure Verification/Time Out Yes - 12:00 Taken: Start Time: 12:00 Pain Control: Lidocaine 4% Topical Solution Total Area Debrided (L x W): 0.2 (cm) x 0.3 (cm) = 0.06 (cm) Tissue and other material Viable, Non-Viable, Slough, Subcutaneous, Skin: Dermis , Skin: Epidermis, Slough debrided: Level: Skin/Subcutaneous Tissue Debridement Description: Excisional Instrument: Curette Bleeding: Minimum Hemostasis Achieved: Pressure End Time: 12:07 Procedural Pain:  0 Post Procedural Pain: 0 Response to Treatment: Procedure was tolerated well Level of Consciousness (Post- Awake and Alert procedure): Post Debridement Measurements of Total Wound Length: (cm) 0.2 Width: (cm) 0.3 Depth: (cm) 0.1 Volume: (cm) 0.005 Character of Wound/Ulcer Post Debridement: Improved Severity of Tissue Post Debridement: Fat layer exposed Post Procedure Diagnosis Same as Pre-procedure Electronic Signature(s) Signed: 12/12/2020 11:27:08 AM By: Carlene Coria RN Signed: 12/12/2020 5:47:45 PM By: Worthy Keeler PA-C Entered By: Carlene Coria on 12/10/2020 12:04:57 [name] ([id number]) -------------------------------------------------------------------------------- Debridement Details Patient Name: [name], [name]. Date of Service: 12/10/2020 11:00 AM Medical Record Number: [id number] Patient Account Number: [id number] Date of Birth/Sex: 17-[name]-1946 (75 y.o. M) Treating RN: Carlene Coria Primary Care Provider: Fulton Reek Other Clinician: Referring Provider: Fulton Reek Treating Provider/Extender: Skipper Cliche in Treatment: 11 Debridement Performed for Wound #3 Left Metatarsal head first Assessment: Performed By: Physician Tommie Sams., PA-C Debridement Type: Debridement Severity of Tissue Pre Debridement: Fat layer exposed Level of Consciousness (Pre- Awake and Alert procedure): Pre-procedure Verification/Time Out Yes - 12:00 Taken: Start Time: 12:00 Pain Control: Lidocaine 4% Topical Solution Total Area Debrided (L x W): 0.2 (cm) x 1.1 (cm) = 0.22 (cm) Tissue and other material Viable, Non-Viable, Callus, Slough, Subcutaneous, Skin: Dermis , Skin: Epidermis, Slough debrided: Level: Skin/Subcutaneous Tissue Debridement Description: Excisional Instrument: Curette Bleeding: Minimum Hemostasis Achieved: Pressure End Time: 12:07 Procedural Pain: 0 Post Procedural Pain: 0 Response to Treatment: Procedure was tolerated well Level of  Consciousness (Post- Awake and Alert procedure): Post Debridement Measurements of Total Wound Length: (cm) 0.2 Width: (cm) 1.1 Depth: (cm) 0.2 Volume: (cm) 0.035 Character of Wound/Ulcer Post Debridement: Improved Severity of Tissue Post Debridement: Fat layer exposed Post Procedure Diagnosis Same as Pre-procedure Electronic Signature(s) Signed: 12/12/2020 11:27:08 AM By: Carlene Coria RN Signed: 12/12/2020 5:47:45 PM By: Worthy Keeler PA-C Entered By: Carlene Coria on 12/10/2020 12:08:17 [name] ([id number]) -------------------------------------------------------------------------------- HPI Details Patient Name: [name], [name]. Date of Service: 12/10/2020 11:00 AM Medical Record Number: [id number] Patient Account Number: [id number]  Date of Birth/Sex: 1945-11-07 (75 y.o. M) Treating RN: Dolan Amen Primary Care Provider: Fulton Reek Other Clinician: Referring Provider: Fulton Reek Treating Provider/Extender: Skipper Cliche in Treatment: 11 History of Present Illness HPI Description: 09/24/2020 on evaluation today patient presents today for a heel ulcer that he tells me has been present for about 2 years. He has been seeing podiatry and they have been attempting to manage this including what sounds to be a total contact cast, Unna boot, and just standard dressings otherwise as well. Most recently has been using triple antibiotic ointment. With that being said unfortunately despite everything he really has not had any significant improvement. He tells me that he cannot even really remember exactly how this began but he presumed it may have rubbed on his shoes or something of that nature. 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 He also does have chronic pain in the way of neuropathy which he takes  medications for including Cymbalta and methadone. He tells me that this does seem to help. Fortunately there is no signs of active infection at this time. His most recent hemoglobin A1c was 8.1 though he knows this was this year he cannot tell me the exact time. His fluid pills currently to help with some of the lower extremity edema although he does obviously have signs of venous stasis/lymphedema. Currently there is no evidence of active infection. No fevers, chills, nausea, vomiting, or diarrhea. Patient has had fairly recent ABIs which were performed on 07/19/2020 and revealed that he has normal findings in both the ankle and toe locations bilaterally. His ABI on the right was 1.09 on the left was 1.08 with a TBI on the right of 0.88 and on the left of 0.94. Triphasic flow was noted throughout. 10/08/2020 on evaluation today patient appears to be doing pretty well in regard to his left heel currently in fact this is doing a great job and seems to be healing quite nicely. Unfortunately on his right leg he had a pile of wood that actually fell on him injuring his right leg this is somewhat erythematous has me concerned little bit about cellulitis though there is not really a good area to culture at this point. 10/24/2020 upon evaluation today patient appears to be doing well with regard to his heel ulcer. He is showing signs of improvement which is great news. His right leg is completely healed. Overall I feel like he is doing excellent and there is no signs of infection. 11/07/2020 upon evaluation today patient appears to be doing well with regard to his heel ulcer. He tells me that last week when he was unable to come in his wife actually thought that the wound was very close to closing if not closed. Then it began to "reopen again". I really feel like what may have happened as the collagen may have dried over the wound bed and that because that misunderstanding with thinking that the wound was  healing. With that being said I did not see it last week I do not know that for certain. Either way I feel like he is doing great today I see no signs of infection at this point. 11/26/2020 upon inspection today patient appears to be doing decently well in regard to his heel ulcer. He has been tolerating the dressing changes without complication.  Fortunately there is no sign of active infection at this time. No fevers, chills, nausea, vomiting, or diarrhea. 12/10/2020 upon evaluation today patient appears to be doing fairly well in regard to the wound on his heel as well as what appears to be a new wound of the left first metatarsal head plantar aspect. This seems to be an area that was callus that has split as the patient tells me has been trying to walk on his toes more has probably where this came from. With that being said there does not appear to be signs of active infection which is great news. Electronic Signature(s) Signed: 12/10/2020 1:34:25 PM By: Worthy Keeler PA-C Entered By: Worthy Keeler on 12/10/2020 13:34:24 LOUISE, VICTORY ([id number]) -------------------------------------------------------------------------------- Physical Exam Details Patient Name: KWAME, RYLAND Date of Service: 12/10/2020 11:00 AM Medical Record Number: [id number] Patient Account Number: [id number] Date of Birth/Sex: 08/21/1946 (75 y.o. M) Treating RN: Dolan Amen Primary Care Provider: Fulton Reek Other Clinician: Referring Provider: Fulton Reek Treating Provider/Extender: Skipper Cliche in Treatment: 39 Constitutional Well-nourished and well-hydrated in no acute distress. Respiratory normal breathing without difficulty. Psychiatric this patient is able to make decisions and demonstrates good insight into disease process. Alert and Oriented x 3. pleasant and cooperative. Notes Upon inspection patient's wound bed actually showed signs of good granulation at this time. There does not  appear to be any evidence of active infection which is great news and overall very pleased with where things stand today. No fevers, chills, nausea, vomiting, or diarrhea. Electronic Signature(s) Signed: 12/10/2020 1:35:21 PM By: Worthy Keeler PA-C Entered By: Worthy Keeler on 12/10/2020 13:35:20 DUELL, HOLDREN ([id number]) -------------------------------------------------------------------------------- Physician Orders Details Patient Name: HUTTON, PELLICANE. Date of Service: 12/10/2020 11:00 AM Medical Record Number: [id number] Patient Account Number: [id number] Date of Birth/Sex: [birth year] (75 y.o. 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 Edema Control - Lymphedema / Segmental Compressive Device / Other Wound #1 Left Calcaneus o Elevate legs to the level of the heart and pump ankles as often as possible Wound Treatment Wound #1 - Calcaneus Wound Laterality: Left Cleanser: Byram Ancillary Kit - 15 Day Supply (Generic) 3 x Per Week/30 Days Discharge Instructions: Use supplies as instructed; Kit contains: (15) Saline Bullets; (15) 3x3 Gauze; 15 pr Gloves Cleanser: Normal Saline (Generic) 3 x Per Week/30 Days Discharge Instructions: Wash your hands with soap and water. Remove old dressing, discard into plastic bag and place into trash. Cleanse the wound with Normal Saline prior to applying a clean dressing using gauze sponges,  not tissues or cotton balls. Do not scrub or use excessive force. Pat dry using gauze sponges, not tissue or cotton balls. Primary Dressing: Silvercel Small 2x2 (in/in) (Generic) 3 x Per Week/30 Days Discharge Instructions: Apply Silvercel Small 2x2 (in/in) as instructed Secondary Dressing: Coverlet Latex-Free Fabric Adhesive Dressings (Generic) 3 x Per Week/30 Days Discharge Instructions: 1.5 x 2 Wound #3 - Metatarsal head first Wound Laterality: Left Cleanser: Byram Ancillary Kit - 15 Day Supply (Generic) 3 x Per  Week/30 Days Discharge Instructions: Use supplies as instructed; Kit contains: (15) Saline Bullets; (15) 3x3 Gauze; 15 pr Gloves Cleanser: Normal Saline (Generic) 3 x Per Week/30 Days Discharge Instructions: Wash your hands with soap and water. Remove old dressing, discard into plastic bag and place into trash. Cleanse the wound with Normal Saline prior to applying a clean dressing using gauze sponges, not tissues or cotton balls. Do not scrub or use excessive force. Pat dry using gauze sponges, not tissue or cotton balls. Primary Dressing: Silvercel Small 2x2 (in/in) (Generic) 3 x Per Week/30 Days Discharge Instructions: Apply Silvercel Small 2x2 (in/in) as instructed Secondary Dressing: Coverlet Latex-Free Fabric Adhesive Dressings (Generic) 3 x Per Week/30 Days Discharge Instructions: 1.5 x 2 Electronic Signature(s) Signed: 12/12/2020 11:27:08 AM By: Carlene Coria RN Signed: 12/12/2020 5:47:45 PM By: Derry Lory ([id number]) Entered By: Carlene Coria on 12/10/2020 12:12:36 NAJI, MEHRINGER ([id number]) -------------------------------------------------------------------------------- Problem List Details Patient Name: MANFRED, LASPINA. Date of Service: 12/10/2020 11:00 AM Medical Record Number: [id number] Patient Account Number: [id number] Date of Birth/Sex: [birth year] (75 y.o. 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 Date of Service: 12/10/2020 11:00 AM Medical Record Number: [id number] Patient Account Number: [id number] Date of Birth/Sex: [birth year] (75 y.o. M) Treating RN: Dolan Amen Primary Care Provider: Fulton Reek Other Clinician: Referring Provider: Fulton Reek Treating Provider/Extender: Skipper Cliche in Treatment: 11 Subjective Chief Complaint Information obtained from Patient Left heel pressure ulcer and Right LE traumatic ulcers History of Present Illness (HPI) 09/24/2020 on evaluation today patient presents today for a heel ulcer that he tells me has been present for about 2 years. He has been seeing podiatry and they have been attempting to manage this including what sounds to be a total contact cast, Unna boot, and just standard dressings otherwise as well. Most recently has been using triple antibiotic ointment. With that being said unfortunately despite everything he really has not had any significant improvement. He tells me that he cannot even really remember exactly how this began but he  presumed it may have rubbed on his shoes or something of that nature. 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 He tells me that this does seem to help. Fortunately there is no signs of active infection at this time. His most recent hemoglobin A1c was 8.1 though he knows this was this year he cannot tell me the exact time. His fluid pills currently to help with some of the lower extremity edema although he does  obviously have signs of venous stasis/lymphedema. Currently there is no evidence of active infection. No fevers, chills, nausea, vomiting, or diarrhea. Patient has had fairly recent ABIs which were performed on 07/19/2020 and revealed that he has normal findings in both the ankle and toe locations bilaterally. His ABI on the right was 1.09 on the left was 1.08 with a TBI on the right of 0.88 and on the left of 0.94. Triphasic flow was noted throughout. 10/08/2020 on evaluation today patient appears to be doing pretty well in regard to his left heel currently in fact this is doing a great job and seems to be healing quite nicely. Unfortunately on his right leg he had a pile of wood that actually fell on him injuring his right leg this is somewhat erythematous has me concerned little bit about cellulitis though there is not really a good area to culture at this point. 10/24/2020 upon evaluation today patient appears to be doing well with regard to his heel ulcer. He is showing signs of improvement which is great news. His right leg is completely healed. Overall I feel like he is doing excellent and there is no signs of infection. 11/07/2020 upon evaluation today patient appears to be doing well with regard to his heel  ulcer. He tells me that last week when he was unable to come in his wife actually thought that the wound was very close to closing if not closed. Then it began to "reopen again". I really feel like what may have happened as the collagen may have dried over the wound bed and that because that misunderstanding with thinking that the wound was healing. With that being said I did not see it last week I do not know that for certain. Either way I feel like he is doing great today I see no signs of infection at this point. 11/26/2020 upon inspection today patient appears to be doing decently well in regard to his heel ulcer. He has been tolerating the dressing changes without complication. Fortunately there is no sign of active infection at this time. No fevers, chills, nausea, vomiting, or diarrhea. 12/10/2020 upon evaluation today patient appears to be doing fairly well in regard to the wound on his heel as well as what appears to be a new wound of the left first metatarsal head plantar aspect. This seems to be an area that was callus that has split as the patient tells me has been trying to walk on his toes more has probably where this came from. With that being said there does not appear to be signs of active infection which is great news. Objective Constitutional Well-nourished and well-hydrated in no acute distress. Vitals Time Taken: 11:42 AM, Height: 70 in, Weight: 262 lbs, BMI: 37.6, Temperature: 97.9 F, Pulse: 83 bpm, Respiratory Rate: 18 breaths/min, Blood Pressure: 133/76 mmHg. Respiratory normal breathing without difficulty. Psychiatric this patient is able to make decisions and demonstrates good insight into disease process. Alert and Oriented x 3. pleasant and cooperative. DEVAUN, HERNANDEZ ([id number]) General Notes: Upon inspection patient's wound bed actually showed signs of good granulation at this time. There does not appear to be any evidence of active infection which is great news  and overall very pleased with where things stand today. No fevers, chills, nausea, vomiting, or diarrhea. Integumentary (Hair, Skin) Wound #1 status is Open. Original cause of wound was Gradually Appeared. The date acquired was: 09/24/1998. The wound has been in treatment 11 weeks. The wound is located on  the Left Calcaneus. The wound measures 0.2cm length x 0.3cm width x 0.1cm depth; 0.047cm^2 area and 0.005cm^3 volume. There is Fat Layer (Subcutaneous Tissue) exposed. There is no tunneling or undermining noted. There is a none present amount of drainage noted. The wound margin is flat and intact. There is medium (34-66%) granulation within the wound bed. There is a medium (34-66%) amount of necrotic tissue within the wound bed including Adherent Slough. Wound #3 status is Open. Original cause of wound was Gradually Appeared. The date acquired was: 12/08/2020. The wound is located on the Left Metatarsal head first. The wound measures 0.2cm length x 1.1cm width x 0.2cm depth; 0.173cm^2 area and 0.035cm^3 volume. There is Fat Layer (Subcutaneous Tissue) exposed. There is no tunneling or undermining noted. There is a medium amount of serosanguineous drainage noted. There is no granulation within the wound bed. There is a large (67-100%) amount of necrotic tissue within the wound bed including Eschar and Adherent Slough. 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 There was a Excisional Skin/Subcutaneous Tissue Debridement with a total area of 0.06 sq cm performed by Tommie Sams., PA-C. With the following instrument(s): Curette to remove Viable and Non-Viable tissue/material. Material removed includes Subcutaneous Tissue, Slough, Skin: Dermis, and Skin: Epidermis after achieving pain control using Lidocaine 4% Topical Solution. No specimens were taken. A time out was conducted at 12:00, prior to the start of the procedure. A Minimum amount of bleeding was controlled with Pressure. The procedure was tolerated well with a pain level of 0 throughout and a pain level of 0 following the procedure. Post Debridement Measurements: 0.2cm length x 0.3cm width x 0.1cm depth; 0.005cm^3 volume. Character of Wound/Ulcer Post Debridement is improved. Severity of Tissue Post Debridement is: Fat layer exposed. Post procedure Diagnosis Wound #1: Same as Pre-Procedure Wound #3 Pre-procedure diagnosis of Wound #3 is a Diabetic Wound/Ulcer of the Lower Extremity located on the Left Metatarsal head first .Severity of Tissue Pre Debridement is: Fat layer exposed. There was a Excisional Skin/Subcutaneous Tissue Debridement with a total area of 0.22 sq cm performed by Tommie Sams., PA-C. With the following instrument(s): Curette to remove Viable and Non-Viable tissue/material. Material removed includes Callus, Subcutaneous Tissue, Slough, Skin: Dermis, and Skin: Epidermis after achieving pain control using Lidocaine 4% Topical Solution. No specimens were taken. A time out was conducted at 12:00, prior to the start of the procedure. A Minimum amount of bleeding was controlled with Pressure. The procedure was tolerated well with a pain level of 0 throughout and a pain level of 0 following the procedure. Post Debridement Measurements: 0.2cm length x 1.1cm width x 0.2cm depth; 0.035cm^3 volume. Character of Wound/Ulcer Post Debridement is improved. Severity of Tissue Post Debridement is: Fat layer exposed. Post procedure Diagnosis Wound #3: Same as Pre-Procedure Plan Follow-up  Appointments: Return Appointment in 1 week. Edema Control - Lymphedema / Segmental Compressive Device / Other: Wound #1 Left Calcaneus: Elevate legs to the level of the heart and pump ankles as often as possible WOUND #1: - Calcaneus Wound Laterality: Left Cleanser: Byram Ancillary Kit - 15 Day Supply (Generic)  3 x Per Week/30 Days AMOL, DOMANSKI ([id number]) Discharge Instructions: Use supplies as instructed; Kit contains: (15) Saline Bullets; (15) 3x3 Gauze; 15 pr Gloves Cleanser: Normal Saline (Generic) 3 x Per Week/30 Days Discharge Instructions: Wash your hands with soap and water. Remove old dressing, discard into plastic bag and place into trash. Cleanse the wound with Normal Saline prior to applying a clean dressing using gauze sponges, not tissues or cotton balls. Do not scrub or use excessive force. Pat dry using gauze sponges, not tissue or cotton balls. Primary Dressing: Silvercel Small 2x2 (in/in) (Generic) 3 x Per Week/30 Days Discharge Instructions: Apply Silvercel Small 2x2 (in/in) as instructed Secondary Dressing: Coverlet Latex-Free Fabric Adhesive Dressings (Generic) 3 x Per Week/30 Days Discharge Instructions: 1.5 x 2 WOUND #3: - Metatarsal head first Wound Laterality: Left Cleanser: Byram Ancillary Kit - 15 Day Supply (Generic) 3 x Per Week/30 Days Discharge Instructions: Use supplies as instructed; Kit contains: (15) Saline Bullets; (15) 3x3 Gauze; 15 pr Gloves Cleanser: Normal Saline (Generic) 3 x Per Week/30 Days Discharge Instructions: Wash your hands with soap and water. Remove old dressing, discard into plastic bag and place into trash. Cleanse the wound with Normal Saline prior to applying a clean dressing using gauze sponges, not tissues or cotton balls. Do not scrub or use excessive force. Pat dry using gauze sponges, not tissue or cotton balls. Primary Dressing: Silvercel Small 2x2 (in/in) (Generic) 3 x Per Week/30 Days Discharge Instructions: Apply  Silvercel Small 2x2 (in/in) as instructed Secondary Dressing: Coverlet Latex-Free Fabric Adhesive Dressings (Generic) 3 x Per Week/30 Days Discharge Instructions: 1.5 x 2 1. Would recommend currently that we going continue with the wound care measures as before and the patient is in agreement with plan this includes the use of silver alginate to both wound locations I think this is can be a good way to go to actually get these areas to dry up quite nicely. I feel like that if we do that likely he is can be completely healed possibly next week may be within the next 2 weeks for certain. 2. I am also can recommend patient continue to use the border foam dressing for cover. I think that is doing a great job as far as the heel is concerned. We will see patient back for reevaluation in 1 week here in the clinic. If anything worsens or changes patient will contact our office for additional recommendations. Electronic Signature(s) Signed: 12/10/2020 1:35:54 PM By: Worthy Keeler PA-C Entered By: Worthy Keeler on 12/10/2020 13:35:53 BRYNDON, CUMBIE ([id number]) -------------------------------------------------------------------------------- SuperBill Details Patient Name: IKEY, OMARY. Date of Service: 12/10/2020 Medical Record Number: [id number] Patient Account Number: [id number] Date of Birth/Sex: 04-24-1946 (75 y.o. 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 Z79.01 Long term (current) use of anticoagulants Facility Procedures CPT4 Code: 29798921 Description: 11042 -  DEB SUBQ TISSUE 20 SQ CM/< Modifier: Quantity: 1 CPT4 Code: Description: ICD-10 Diagnosis Description L97.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

## 2020-12-12 NOTE — Progress Notes (Signed)
[name] [name] ([id number]) Visit Report for 12/10/2020 Arrival Information Details Patient Name: [name] [name] [name]. Date of Service: 12/10/2020 11:00 AM Medical Record Number: [id number] Patient Account Number: [id number] Date of Birth/Sex: [birth year] (75 y.o. M) Treating RN: Carlene Coria Primary Care Provider: Fulton Reek Other Clinician: Referring Provider: Fulton Reek Treating Provider/Extender: Skipper Cliche in Treatment: 11 Visit Information History Since Last Visit All ordered tests and consults were completed: No Patient Arrived: [name] Added or deleted any medications: No Arrival Time: 11:41 Any new allergies or adverse reactions: No Accompanied By: self Had a fall or experienced change in No Transfer Assistance: None activities of daily living that may affect Patient Identification Verified: Yes risk of falls: Secondary Verification Process Completed: Yes Signs or symptoms of abuse/neglect since last visito No Patient Requires Transmission-Based Precautions: No Hospitalized since last visit: No Patient Has Alerts: Yes Implantable device outside of the clinic excluding No Patient Alerts: Type II Diabetic cellular tissue based products placed in the center 81 mg Aspirin since last visit: Warfarin Has Dressing in Place as Prescribed: Yes Has Compression in Place as Prescribed: Yes Pain Present Now: No Electronic Signature(s) Signed: 12/12/2020 11:27:08 AM By: Carlene Coria RN Entered By: Carlene Coria on 12/10/2020 11:42:29 [name] [name] ([id number]) -------------------------------------------------------------------------------- Clinic Level of Care Assessment Details Patient Name: [name] [name]. Date of Service: 12/10/2020 11:00 AM Medical Record Number: [id number] Patient Account Number: [id number] Date of Birth/Sex: 12-24-1945 (75 y.o. M) Treating RN: Carlene Coria Primary Care Provider: Fulton Reek Other Clinician: Referring Provider:  Fulton Reek Treating Provider/Extender: Skipper Cliche in Treatment: 11 Clinic Level of Care Assessment Items TOOL 4 Quantity Score '[]'  - Use when only an EandM is performed on FOLLOW-UP visit 0 ASSESSMENTS - Nursing Assessment / Reassessment '[]'  - Reassessment of Co-morbidities (includes updates in patient status) 0 '[]'  - 0 Reassessment of Adherence to Treatment Plan ASSESSMENTS - Wound and Skin Assessment / Reassessment '[]'  - Simple Wound Assessment / Reassessment - one wound 0 '[]'  - 0 Complex Wound Assessment / Reassessment - multiple wounds '[]'  - 0 Dermatologic / Skin Assessment (not related to wound area) ASSESSMENTS - Focused Assessment '[]'  - Circumferential Edema Measurements - multi extremities 0 '[]'  - 0 Nutritional Assessment / Counseling / Intervention '[]'  - 0 Lower Extremity Assessment (monofilament, tuning fork, pulses) '[]'  - 0 Peripheral Arterial Disease Assessment (using hand held doppler) ASSESSMENTS - Ostomy and/or Continence Assessment and Care '[]'  - Incontinence Assessment and Management 0 '[]'  - 0 Ostomy Care Assessment and Management (repouching, etc.) PROCESS - Coordination of Care '[]'  - Simple Patient / Family Education for ongoing care 0 '[]'  - 0 Complex (extensive) Patient / Family Education for ongoing care '[]'  - 0 Staff obtains Programmer, systems, Records, Test Results / Process Orders '[]'  - 0 Staff telephones HHA, Nursing Homes / Clarify orders / etc '[]'  - 0 Routine Transfer to another Facility (non-emergent condition) '[]'  - 0 Routine Hospital Admission (non-emergent condition) '[]'  - 0 New Admissions / Biomedical engineer / Ordering NPWT, Apligraf, etc. '[]'  - 0 Emergency Hospital Admission (emergent condition) '[]'  - 0 Simple Discharge Coordination '[]'  - 0 Complex (extensive) Discharge Coordination PROCESS - Special Needs '[]'  - Pediatric / Minor Patient Management 0 '[]'  - 0 Isolation Patient Management '[]'  - 0 Hearing / Language / Visual special needs '[]'  -  0 Assessment of Community assistance (transportation, D/C planning, etc.) '[]'  - 0 Additional assistance / Altered mentation '[]'  - 0 Support Surface(s) Assessment (bed, cushion, seat, etc.) INTERVENTIONS - Wound Cleansing /  Measurement OSAMAH, SCHMADER ([id number]) '[]'  - 0 Simple Wound Cleansing - one wound '[]'  - 0 Complex Wound Cleansing - multiple wounds '[]'  - 0 Wound Imaging (photographs - any number of wounds) '[]'  - 0 Wound Tracing (instead of photographs) '[]'  - 0 Simple Wound Measurement - one wound '[]'  - 0 Complex Wound Measurement - multiple wounds INTERVENTIONS - Wound Dressings '[]'  - Small Wound Dressing one or multiple wounds 0 '[]'  - 0 Medium Wound Dressing one or multiple wounds '[]'  - 0 Large Wound Dressing one or multiple wounds '[]'  - 0 Application of Medications - topical '[]'  - 0 Application of Medications - injection INTERVENTIONS - Miscellaneous '[]'  - External ear exam 0 '[]'  - 0 Specimen Collection (cultures, biopsies, blood, body fluids, etc.) '[]'  - 0 Specimen(s) / Culture(s) sent or taken to Lab for analysis '[]'  - 0 Patient Transfer (multiple staff / Civil Service fast streamer / Similar devices) '[]'  - 0 Simple Staple / Suture removal (25 or less) '[]'  - 0 Complex Staple / Suture removal (26 or more) '[]'  - 0 Hypo / Hyperglycemic Management (close monitor of Blood Glucose) '[]'  - 0 Ankle / Brachial Index (ABI) - do not check if billed separately '[]'  - 0 Vital Signs Has the patient been seen at the hospital within the last three years: Yes Total Score: 0 Level Of Care: ____ Electronic Signature(s) Signed: 12/12/2020 11:27:08 AM By: Carlene Coria RN Entered By: Carlene Coria on 12/10/2020 12:12:52 [name] [name] ([id number]) -------------------------------------------------------------------------------- Encounter Discharge Information Details Patient Name: [name] [name]. Date of Service: 12/10/2020 11:00 AM Medical Record Number: [id number] Patient Account Number: [id number] Date  of Birth/Sex: 1946/09/09 (75 y.o. M) Treating RN: Carlene Coria Primary Care Provider: Fulton Reek Other Clinician: Referring Provider: Fulton Reek Treating Provider/Extender: Skipper Cliche in Treatment: 11 Encounter Discharge Information Items Post Procedure Vitals Discharge Condition: Stable Temperature (F): 97.9 Ambulatory Status: Cane Pulse (bpm): 83 Discharge Destination: Home Respiratory Rate (breaths/min): 18 Transportation: Private Auto Blood Pressure (mmHg): 133/76 Accompanied By: self Schedule Follow-up Appointment: Yes Clinical Summary of Care: Patient Declined Electronic Signature(s) Signed: 12/12/2020 11:27:08 AM By: Carlene Coria RN Entered By: Carlene Coria on 12/10/2020 12:14:10 [name] [name] ([id number]) -------------------------------------------------------------------------------- Lower Extremity Assessment Details Patient Name: [name] [name]. Date of Service: 12/10/2020 11:00 AM Medical Record Number: [id number] Patient Account Number: [id number] Date of Birth/Sex: 1946-08-04 (75 y.o. M) Treating RN: Carlene Coria Primary Care Provider: Fulton Reek Other Clinician: Referring Provider: Fulton Reek Treating Provider/Extender: Skipper Cliche in Treatment: 11 Edema Assessment Assessed: [Left: No] [Right: No] [Left: Edema] [Right: :] Calf Left: Right: Point of Measurement: 38 cm From Medial Instep 41 cm Ankle Left: Right: Point of Measurement: 9 cm From Medial Instep 23 cm Electronic Signature(s) Signed: 12/12/2020 11:27:08 AM By: Carlene Coria RN Entered By: Carlene Coria on 12/10/2020 11:55:22 [name] [name] ([id number]) -------------------------------------------------------------------------------- Multi Wound Chart Details Patient Name: [name] [name]. Date of Service: 12/10/2020 11:00 AM Medical Record Number: [id number] Patient Account Number: [id number] Date of Birth/Sex: 1946-02-10 (75 y.o. M) Treating RN: Carlene Coria Primary Care Provider: Fulton Reek Other Clinician: Referring Provider: Fulton Reek Treating Provider/Extender: Skipper Cliche in Treatment: 11 Vital Signs Height(in): 70 Pulse(bpm): 52 Weight(lbs): 10 Blood Pressure(mmHg): 133/76 Body Mass Index(BMI): 38 Temperature(F): 97.9 Respiratory Rate(breaths/min): 18 Photos: [N/A:N/A] Wound Location: Left Calcaneus Left Metatarsal head first N/A Wounding Event: Gradually Appeared Gradually Appeared N/A Primary Etiology: Diabetic Wound/Ulcer of the Lower Diabetic Wound/Ulcer of the Lower N/A Extremity Extremity Secondary Etiology: Pressure Ulcer N/A N/A  Comorbid History: Cataracts, Arrhythmia, Coronary Cataracts, Arrhythmia, Coronary N/A Artery Disease, Hypertension, Type Artery Disease, Hypertension, Type II Diabetes, Osteoarthritis, II Diabetes, Osteoarthritis, Neuropathy Neuropathy Date Acquired: 09/24/1998 12/08/2020 N/A Weeks of Treatment: 11 0 N/A Wound Status: Open Open N/A Measurements L x W x D (cm) 0x0x0 0.2x1.1x0.2 N/A Area (cm) : 0 0.173 N/A Volume (cm) : 0 0.035 N/A % Reduction in Area: 100.00% N/A N/A % Reduction in Volume: 100.00% N/A N/A Classification: Grade 2 Grade 2 N/A Exudate Amount: None Present Medium N/A Exudate Type: N/A Serosanguineous N/A Exudate Color: N/A red, brown N/A Wound Margin: Flat and Intact N/A N/A Granulation Amount: None Present (0%) None Present (0%) N/A Necrotic Amount: None Present (0%) Large (67-100%) N/A Necrotic Tissue: N/A Eschar, Adherent Slough N/A Exposed Structures: Fascia: No Fat Layer (Subcutaneous Tissue): N/A Fat Layer (Subcutaneous Tissue): Yes No Fascia: No Tendon: No Tendon: No Muscle: No Muscle: No Joint: No Joint: No Bone: No Bone: No Epithelialization: Large (67-100%) None N/A Treatment Notes Electronic Signature(s) Signed: 12/12/2020 11:27:08 AM By: Carlene Coria RN Entered By: Carlene Coria on 12/10/2020 12:01:41 JENNIFER, HOLLAND  ([id number], KIMBALL ([id number]) -------------------------------------------------------------------------------- Multi-Disciplinary Care Plan Details Patient Name: [name] [name]. Date of Service: 12/10/2020 11:00 AM Medical Record Number: [id number] Patient Account Number: [id number] Date of Birth/Sex: 23-Jul-1946 (75 y.o. M) Treating RN: Carlene Coria Primary Care Provider: Fulton Reek Other Clinician: Referring Provider: Fulton Reek Treating Provider/Extender: Skipper Cliche in Treatment: 11 Active Inactive Wound/Skin Impairment Nursing Diagnoses: Impaired tissue integrity Goals: Ulcer/skin breakdown will have a volume reduction of 30% by week 4 Date Initiated: 09/24/2020 Date Inactivated: 11/26/2020 Target Resolution Date: 10/25/2020 Goal Status: Unmet Unmet Reason: comorbites Ulcer/skin breakdown will have a volume reduction of 50% by week 8 Date Initiated: 09/24/2020 Date Inactivated: 12/10/2020 Target Resolution Date: 11/25/2020 Goal Status: Met Ulcer/skin breakdown will have a volume reduction of 80% by week 12 Date Initiated: 09/24/2020 Target Resolution Date: 12/23/2020 Goal Status: Active Ulcer/skin breakdown will heal within 14 weeks Date Initiated: 09/24/2020 Target Resolution Date: 01/13/2021 Goal Status: Active Interventions: Assess patient/caregiver ability to obtain necessary supplies Assess patient/caregiver ability to perform ulcer/skin care regimen upon admission and as needed Treatment Activities: Referred to DME provider for dressing supplies : 09/24/2020 Skin care regimen initiated : 09/24/2020 Topical wound management initiated : 09/24/2020 Notes: Electronic Signature(s) Signed: 12/12/2020 11:27:08 AM By: Carlene Coria RN Entered By: Carlene Coria on 12/10/2020 12:01:25 [name] [name] ([id number]) -------------------------------------------------------------------------------- Pain Assessment Details Patient Name: [name] [name]. Date  of Service: 12/10/2020 11:00 AM Medical Record Number: [id number] Patient Account Number: [id number] Date of Birth/Sex: 1946-09-15 (75 y.o. M) Treating RN: Carlene Coria Primary Care Provider: Fulton Reek Other Clinician: Referring Provider: Fulton Reek Treating Provider/Extender: Skipper Cliche in Treatment: 11 Active Problems Location of Pain Severity and Description of Pain Patient Has Paino No Site Locations Pain Management and Medication Current Pain Management: Electronic Signature(s) Signed: 12/12/2020 11:27:08 AM By: Carlene Coria RN Entered By: Carlene Coria on 12/10/2020 11:44:34 [name] [name] ([id number]) -------------------------------------------------------------------------------- Patient/Caregiver Education Details Patient Name: [name] [name]. Date of Service: 12/10/2020 11:00 AM Medical Record Number: [id number] Patient Account Number: [id number] Date of Birth/Gender: 11/27/1945 (75 y.o. M) Treating RN: Carlene Coria Primary Care Physician: Fulton Reek Other Clinician: Referring Physician: Fulton Reek Treating Physician/Extender: Skipper Cliche in Treatment: 11 Education Assessment Education Provided To: Patient Education Topics Provided Wound/Skin Impairment: Methods: Explain/Verbal Responses: State content correctly [employer]) Signed: 12/12/2020 11:27:08 AM By: Carlene Coria RN Entered By: Dolores Lory  Carrie on 12/10/2020 12:13:09 RAIYAN, DALESANDRO ([id number]) -------------------------------------------------------------------------------- Wound Assessment Details Patient Name: [name] [name]. Date of Service: 12/10/2020 11:00 AM Medical Record Number: [id number] Patient Account Number: [id number] Date of Birth/Sex: 11-23-1945 (75 y.o. M) Treating RN: Carlene Coria Primary Care Provider: Fulton Reek Other Clinician: Referring Provider: Fulton Reek Treating Provider/Extender: Skipper Cliche in Treatment: 11 Wound  Status Wound Number: 1 Primary Diabetic Wound/Ulcer of the Lower Extremity Etiology: Wound Location: Left Calcaneus Secondary Pressure Ulcer Wounding Event: Gradually Appeared Etiology: Date Acquired: 09/24/1998 Wound Open Weeks Of Treatment: 11 Status: Clustered Wound: No Comorbid Cataracts, Arrhythmia, Coronary Artery Disease, History: Hypertension, Type II Diabetes, Osteoarthritis, Neuropathy Photos Wound Measurements Length: (cm) 0.2 Width: (cm) 0.3 Depth: (cm) 0.1 Area: (cm) 0.047 Volume: (cm) 0.005 % Reduction in Area: 98.9% % Reduction in Volume: 98.8% Epithelialization: Large (67-100%) Tunneling: No Undermining: No Wound Description Classification: Grade 2 Wound Margin: Flat and Intact Exudate Amount: None Present Foul Odor After Cleansing: No Slough/Fibrino Yes Wound Bed Granulation Amount: Medium (34-66%) Exposed Structure Necrotic Amount: Medium (34-66%) Fascia Exposed: No Necrotic Quality: Adherent Slough Fat Layer (Subcutaneous Tissue) Exposed: Yes Tendon Exposed: No Muscle Exposed: No Joint Exposed: No Bone Exposed: No Treatment Notes Wound #1 (Calcaneus) Wound Laterality: Left Cleanser Byram Ancillary Kit - 15 Day Supply Discharge Instruction: Use supplies as instructed; Kit contains: (15) Saline Bullets; (15) 3x3 Gauze; 15 pr Gloves Normal Saline SEVERIANO, [place]SEY ([id number]) Discharge Instruction: Wash your hands with soap and water. Remove old dressing, discard into plastic bag and place into trash. Cleanse the wound with Normal Saline prior to applying a clean dressing using gauze sponges, not tissues or cotton balls. Do not scrub or use excessive force. Pat dry using gauze sponges, not tissue or cotton balls. Peri-Wound Care Topical Primary Dressing Silvercel Small 2x2 (in/in) Discharge Instruction: Apply Silvercel Small 2x2 (in/in) as instructed Secondary Dressing Coverlet Latex-Free Fabric Adhesive Dressings Discharge Instruction:  1.5 x 2 Secured With Compression Wrap Compression Stockings Add-Ons Electronic Signature(s) Signed: 12/12/2020 11:27:08 AM By: Carlene Coria RN Entered By: Carlene Coria on 12/10/2020 12:03:37 [name] [name] ([id number]) -------------------------------------------------------------------------------- Wound Assessment Details Patient Name: MENG, WINTERTON. Date of Service: 12/10/2020 11:00 AM Medical Record Number: [id number] Patient Account Number: [id number] Date of Birth/Sex: 1946/02/17 (75 y.o. M) Treating RN: Carlene Coria Primary Care Provider: Fulton Reek Other Clinician: Referring Provider: Fulton Reek Treating Provider/Extender: Skipper Cliche in Treatment: 11 Wound Status Wound Number: 3 Primary Diabetic Wound/Ulcer of the Lower Extremity Etiology: Wound Location: Left Metatarsal head first Wound Open Wounding Event: Gradually Appeared Status: Date Acquired: 12/08/2020 Comorbid Cataracts, Arrhythmia, Coronary Artery Disease, Weeks Of Treatment: 0 History: Hypertension, Type II Diabetes, Osteoarthritis, Clustered Wound: No Neuropathy Photos Wound Measurements Length: (cm) 0.2 Width: (cm) 1.1 Depth: (cm) 0.2 Area: (cm) 0.173 Volume: (cm) 0.035 % Reduction in Area: % Reduction in Volume: Epithelialization: None Tunneling: No Undermining: No Wound Description Classification: Grade 2 Exudate Amount: Medium Exudate Type: Serosanguineous Exudate Color: red, brown Foul Odor After Cleansing: No Slough/Fibrino Yes Wound Bed Granulation Amount: None Present (0%) Exposed Structure Necrotic Amount: Large (67-100%) Fascia Exposed: No Necrotic Quality: Eschar, Adherent Slough Fat Layer (Subcutaneous Tissue) Exposed: Yes Tendon Exposed: No Muscle Exposed: No Joint Exposed: No Bone Exposed: No Treatment Notes Wound #3 (Metatarsal head first) Wound Laterality: Left Cleanser Byram Ancillary Kit - 15 Day Supply Discharge Instruction: Use supplies as  instructed; Kit contains: (15) Saline Bullets; (15) 3x3 Gauze; 15 pr Gloves Normal Saline LENNYN, BELLANCA ([id number]) Discharge Instruction:  Wash your hands with soap and water. Remove old dressing, discard into plastic bag and place into trash. Cleanse the wound with Normal Saline prior to applying a clean dressing using gauze sponges, not tissues or cotton balls. Do not scrub or use excessive force. Pat dry using gauze sponges, not tissue or cotton balls. Peri-Wound Care Topical Primary Dressing Silvercel Small 2x2 (in/in) Discharge Instruction: Apply Silvercel Small 2x2 (in/in) as instructed Secondary Dressing Coverlet Latex-Free Fabric Adhesive Dressings Discharge Instruction: 1.5 x 2 Secured With Compression Wrap Compression Stockings Add-Ons Electronic Signature(s) Signed: 12/12/2020 11:27:08 AM By: Carlene Coria RN Entered By: Carlene Coria on 12/10/2020 11:53:12 [name] [name] ([id number]) -------------------------------------------------------------------------------- Vitals Details Patient Name: JAYLEEN, AFONSO. Date of Service: 12/10/2020 11:00 AM Medical Record Number: [id number] Patient Account Number: [id number] Date of Birth/Sex: [birth year] (75 y.o. M) Treating RN: Carlene Coria Primary Care Provider: Fulton Reek Other Clinician: Referring Provider: Fulton Reek Treating Provider/Extender: Skipper Cliche in Treatment: 11 Vital Signs Time Taken: 11:42 Temperature (F): 97.9 Height (in): 70 Pulse (bpm): 83 Weight (lbs): 262 Respiratory Rate (breaths/min): 18 Body Mass Index (BMI): 37.6 Blood Pressure (mmHg): 133/76 Reference Range: 80 - 120 mg / dl Electronic Signature(s) Signed: 12/12/2020 11:27:08 AM By: Carlene Coria RN Entered By: Carlene Coria on 12/10/2020 11:44:27

## 2020-12-17 ENCOUNTER — Encounter: Payer: Medicare HMO | Attending: Physician Assistant | Admitting: Physician Assistant

## 2020-12-17 ENCOUNTER — Other Ambulatory Visit: Payer: Self-pay

## 2020-12-17 DIAGNOSIS — Z79899 Other long term (current) drug therapy: Secondary | ICD-10-CM | POA: Diagnosis not present

## 2020-12-17 DIAGNOSIS — Z6837 Body mass index (BMI) 37.0-37.9, adult: Secondary | ICD-10-CM | POA: Diagnosis not present

## 2020-12-17 DIAGNOSIS — Z952 Presence of prosthetic heart valve: Secondary | ICD-10-CM | POA: Insufficient documentation

## 2020-12-17 DIAGNOSIS — L84 Corns and callosities: Secondary | ICD-10-CM | POA: Insufficient documentation

## 2020-12-17 DIAGNOSIS — E669 Obesity, unspecified: Secondary | ICD-10-CM | POA: Diagnosis not present

## 2020-12-17 DIAGNOSIS — Z79891 Long term (current) use of opiate analgesic: Secondary | ICD-10-CM | POA: Diagnosis not present

## 2020-12-17 DIAGNOSIS — L89629 Pressure ulcer of left heel, unspecified stage: Secondary | ICD-10-CM | POA: Diagnosis present

## 2020-12-17 DIAGNOSIS — L89623 Pressure ulcer of left heel, stage 3: Secondary | ICD-10-CM | POA: Diagnosis not present

## 2020-12-17 DIAGNOSIS — Z7901 Long term (current) use of anticoagulants: Secondary | ICD-10-CM | POA: Insufficient documentation

## 2020-12-17 DIAGNOSIS — E11621 Type 2 diabetes mellitus with foot ulcer: Secondary | ICD-10-CM | POA: Diagnosis not present

## 2020-12-17 DIAGNOSIS — L97812 Non-pressure chronic ulcer of other part of right lower leg with fat layer exposed: Secondary | ICD-10-CM | POA: Diagnosis not present

## 2020-12-17 NOTE — Progress Notes (Addendum)
KAVEN, CUMBIE ([id number]) Visit Report for 12/17/2020 Chief Complaint Document Details Patient Name: [name], [name]. Date of Service: 12/17/2020 1:45 PM Medical Record Number: [id number] Patient Account Number: [id number] Date of Birth/Sex: 1946/08/10 (75 y.o. M) Treating RN: Dolan Amen Primary Care Provider: Fulton Reek Other Clinician: Referring Provider: Fulton Reek Treating Provider/Extender: Skipper Cliche in Treatment: 12 Information Obtained from: Patient Chief Complaint Left heel pressure ulcer and Right LE traumatic ulcers Electronic Signature(s) Signed: 12/17/2020 1:57:13 PM By: Worthy Keeler PA-C Entered By: Worthy Keeler on 12/17/2020 13:57:12 [name], [name] ([id number]) -------------------------------------------------------------------------------- Debridement Details Patient Name: [name]. Date of Service: 12/17/2020 1:45 PM Medical Record Number: [id number] Patient Account Number: [id number] Date of Birth/Sex: May 29, 1946 (75 y.o. M) Treating RN: Dolan Amen Primary Care Provider: Fulton Reek Other Clinician: Referring Provider: Fulton Reek Treating Provider/Extender: Skipper Cliche in Treatment: 12 Debridement Performed for Wound #3 Left Metatarsal head first Assessment: Performed By: Physician [name]., PA-C Debridement Type: Debridement Severity of Tissue Pre Debridement: Fat layer exposed Level of Consciousness (Pre- Awake and Alert procedure): Pre-procedure Verification/Time Out Yes - 14:12 Taken: Start Time: 14:12 Total Area Debrided (L x W): 1 (cm) x 1 (cm) = 1 (cm) Tissue and other material Viable, Non-Viable, Callus, Subcutaneous debrided: Level: Skin/Subcutaneous Tissue Debridement Description: Excisional Instrument: Curette Bleeding: Minimum Hemostasis Achieved: Pressure Response to Treatment: Procedure was tolerated well Level of Consciousness (Post- Awake and Alert procedure): Post  Debridement Measurements of Total Wound Length: (cm) 0.2 Width: (cm) 1.3 Depth: (cm) 0.2 Volume: (cm) 0.041 Character of Wound/Ulcer Post Debridement: Stable Severity of Tissue Post Debridement: Fat layer exposed Post Procedure Diagnosis Same as Pre-procedure Electronic Signature(s) Signed: 12/17/2020 4:50:13 PM By: Worthy Keeler PA-C Signed: 12/17/2020 5:03:53 PM By: Georges Mouse, Minus Breeding RN Entered By: Georges Mouse, Minus Breeding on 12/17/2020 14:15:09 [name] ([id number]) -------------------------------------------------------------------------------- HPI Details Patient Name: [name], [name]. Date of Service: 12/17/2020 1:45 PM Medical Record Number: [id number] Patient Account Number: [id number] Date of Birth/Sex: [birth year] (75 y.o. M) Treating RN: Dolan Amen Primary Care Provider: Fulton Reek Other Clinician: Referring Provider: Fulton Reek Treating Provider/Extender: Skipper Cliche in Treatment: 12 History of Present Illness HPI Description: 09/24/2020 on evaluation today patient presents today for a heel ulcer that he tells me has been present for about 2 years. He has been seeing podiatry and they have been attempting to manage this including what sounds to be a total contact cast, Unna boot, and just standard dressings otherwise as well. Most recently has been using triple antibiotic ointment. With that being said unfortunately despite everything he really has not had any significant improvement. He tells me that he cannot even really remember exactly how this began but he presumed it may have rubbed on his shoes or something of that nature. 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 He also does have chronic pain in the way of neuropathy which he takes medications for including Cymbalta and  methadone. He tells me that this does seem to help. Fortunately there is no signs of active infection at this time. His most recent hemoglobin A1c was 8.1 though he knows this was this year he cannot tell me the exact time. His fluid pills currently to help with some of the lower extremity edema although he does obviously have signs of venous  stasis/lymphedema. Currently there is no evidence of active infection. No fevers, chills, nausea, vomiting, or diarrhea. Patient has had fairly recent ABIs which were performed on 07/19/2020 and revealed that he has normal findings in both the ankle and toe locations bilaterally. His ABI on the right was 1.09 on the left was 1.08 with a TBI on the right of 0.88 and on the left of 0.94. Triphasic flow was noted throughout. 10/08/2020 on evaluation today patient appears to be doing pretty well in regard to his left heel currently in fact this is doing a great job and seems to be healing quite nicely. Unfortunately on his right leg he had a pile of wood that actually fell on him injuring his right leg this is somewhat erythematous has me concerned little bit about cellulitis though there is not really a good area to culture at this point. 10/24/2020 upon evaluation today patient appears to be doing well with regard to his heel ulcer. He is showing signs of improvement which is great news. His right leg is completely healed. Overall I feel like he is doing excellent and there is no signs of infection. 11/07/2020 upon evaluation today patient appears to be doing well with regard to his heel ulcer. He tells me that last week when he was unable to come in his wife actually thought that the wound was very close to closing if not closed. Then it began to "reopen again". I really feel like what may have happened as the collagen may have dried over the wound bed and that because that misunderstanding with thinking that the wound was healing. With that being said I did not see it  last week I do not know that for certain. Either way I feel like he is doing great today I see no signs of infection at this point. 11/26/2020 upon inspection today patient appears to be doing decently well in regard to his heel ulcer. He has been tolerating the dressing changes without complication. Fortunately there is no sign of active infection at this time. No fevers, chills, nausea, vomiting, or diarrhea. 12/10/2020 upon evaluation today patient appears to be doing fairly well in regard to the wound on his heel as well as what appears to be a new wound of the left first metatarsal head plantar aspect. This seems to be an area that was callus that has split as the patient tells me has been trying to walk on his toes more has probably where this came from. With that being said there does not appear to be signs of active infection which is great news. 12/17/2020 upon evaluation today patient actually appears to be making good progress currently. Fortunately there is no evidence of active infection at this time. Overall I feel like he is very close to complete closure. Electronic Signature(s) Signed: 12/17/2020 2:52:44 PM By: Worthy Keeler PA-C Entered By: Worthy Keeler on 12/17/2020 14:52:44 [name] ([id number]) -------------------------------------------------------------------------------- Physical Exam Details Patient Name: [name], [name] Date of Service: 12/17/2020 1:45 PM Medical Record Number: [id number] Patient Account Number: [id number] Date of Birth/Sex: 18-Nov-1945 (75 y.o. M) Treating RN: Dolan Amen Primary Care Provider: Fulton Reek Other Clinician: Referring Provider: Fulton Reek Treating Provider/Extender: Skipper Cliche in Treatment: 41 Constitutional Well-nourished and well-hydrated in no acute distress. Respiratory normal breathing without difficulty. Psychiatric this patient is able to make decisions and demonstrates good insight into disease  process. Alert and Oriented x 3. pleasant and cooperative. Notes Patient's wound bed actually showed  signs again on the heel wound being almost completely closed which is excellent news. In regard to the area underneath his first toe there is a lot of callus buildup but the actual open area is minimal. I think that he is very close to complete resolution in both locations. He actually 2 weeks will be going to the beach. Electronic Signature(s) Signed: 12/17/2020 2:53:07 PM By: Worthy Keeler PA-C Entered By: Worthy Keeler on 12/17/2020 14:53:07 [name] ([id number]) -------------------------------------------------------------------------------- Physician Orders Details Patient Name: [name], [name]. Date of Service: 12/17/2020 1:45 PM Medical Record Number: [id number] Patient Account Number: [id number] Date of Birth/Sex: [birth year] (75 y.o. 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 Wound #3 Left Metatarsal head first o Return Appointment in 1 week. Edema Control - Lymphedema / Segmental Compressive Device / Other Wound #1 Left Calcaneus o Elevate legs to the level of the heart and pump ankles as often as possible o Elevate leg(s) parallel to the floor when sitting. Wound #3 Left Metatarsal head first o Elevate legs to the level of the heart and pump ankles as often as possible o Elevate  leg(s) parallel to the floor when sitting. Wound Treatment Wound #1 - Calcaneus Wound Laterality: Left Cleanser: Normal Saline (Generic) 3 x Per Week/30 Days Discharge Instructions: Wash your hands with soap and water. Remove old dressing, discard into plastic bag and place into trash. Cleanse the wound with Normal Saline prior to applying a clean dressing using gauze sponges, not tissues or cotton balls. Do not scrub or use excessive force. Pat dry using gauze sponges, not tissue or cotton balls. Primary Dressing: Silvercel Small 2x2 (in/in) (Generic) 3 x Per Week/30 Days Discharge Instructions: Apply Silvercel Small 2x2 (in/in) as instructed Secondary Dressing: Coverlet Latex-Free Fabric Adhesive Dressings (Generic) 3 x Per Week/30 Days Discharge Instructions: 1.5 x 2 Wound #3 - Metatarsal head first Wound Laterality: Left Cleanser: Normal Saline (Generic) 3 x Per Week/30 Days Discharge Instructions: Wash your hands with soap and water. Remove old dressing, discard into plastic bag and place into trash. Cleanse the wound with Normal Saline prior to applying a clean dressing using gauze sponges, not tissues or cotton balls. Do not scrub or use excessive force. Pat dry using gauze sponges, not tissue or cotton balls. Primary Dressing: Silvercel Small 2x2 (in/in) (Generic) 3 x Per Week/30 Days Discharge Instructions: Apply Silvercel Small 2x2 (in/in) as instructed Secondary Dressing: Coverlet Latex-Free Fabric Adhesive Dressings (Generic) 3 x Per Week/30 Days Discharge Instructions: 1.5 x 2 Electronic Signature(s) [name], [name] ([id number]) Signed: 12/17/2020 4:50:13 PM By: Worthy Keeler PA-C Signed: 12/17/2020 5:03:53 PM By: Georges Mouse, Minus Breeding RN Entered By: Georges Mouse, Minus Breeding on 12/17/2020 14:16:32 [name] ([id number]) -------------------------------------------------------------------------------- Problem List Details Patient Name: [name], [name]. Date of  Service: 12/17/2020 1:45 PM Medical Record Number: [id number] Patient Account Number: [id number] Date of Birth/Sex: [birth year] (75 y.o. 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 Date of Service: 12/17/2020 1:45 PM Medical Record Number: [id number] Patient Account Number: [id number] Date of Birth/Sex: 1945/12/09 (75 y.o. M) Treating RN: Dolan Amen Primary Care Provider: Fulton Reek Other Clinician: Referring Provider: Fulton Reek Treating Provider/Extender: Skipper Cliche in Treatment: 12 Subjective Chief Complaint Information obtained from Patient Left heel pressure ulcer and Right LE traumatic ulcers History of Present Illness (HPI) 09/24/2020 on evaluation today patient presents today for a heel ulcer that he tells me has been present for about 2 years. He has been seeing podiatry and they have been attempting to manage this including what sounds to be a total contact cast, Unna boot, and just standard dressings otherwise as well. Most recently has been using  triple antibiotic ointment. With that being said unfortunately despite everything he really has not had any significant improvement. He tells me that he cannot even really remember exactly how this began but he presumed it may have rubbed on his shoes or something of that nature. 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 He tells me that this does seem to help. Fortunately there is no signs of active infection at this time. His most recent hemoglobin A1c was 8.1 though he knows this was this year he cannot tell me the exact time. His fluid pills currently to help with some of the lower extremity edema although he does obviously have signs of venous stasis/lymphedema. Currently there is no evidence of active infection. No fevers, chills, nausea, vomiting, or diarrhea. Patient has had fairly recent ABIs which were performed on 07/19/2020 and revealed that he has normal findings in both the ankle and toe locations bilaterally. His ABI on the right was 1.09 on the left was 1.08 with a TBI on the right of 0.88 and on the left of 0.94. Triphasic flow was noted throughout. 10/08/2020 on evaluation today patient appears to be doing pretty well in regard to his left heel currently in fact this is doing a great job and seems to be healing quite nicely. Unfortunately on his right leg he had a pile of wood that actually fell on him injuring his right leg this is somewhat erythematous has me concerned little bit about cellulitis though there is not really a good area to culture at this point. 10/24/2020 upon evaluation today patient appears to be doing well with regard to his heel ulcer. He is showing signs of improvement which is  great news. His right leg is completely healed. Overall I feel like he is doing excellent and there is no signs of infection. 11/07/2020 upon evaluation today patient  appears to be doing well with regard to his heel ulcer. He tells me that last week when he was unable to come in his wife actually thought that the wound was very close to closing if not closed. Then it began to "reopen again". I really feel like what may have happened as the collagen may have dried over the wound bed and that because that misunderstanding with thinking that the wound was healing. With that being said I did not see it last week I do not know that for certain. Either way I feel like he is doing great today I see no signs of infection at this point. 11/26/2020 upon inspection today patient appears to be doing decently well in regard to his heel ulcer. He has been tolerating the dressing changes without complication. Fortunately there is no sign of active infection at this time. No fevers, chills, nausea, vomiting, or diarrhea. 12/10/2020 upon evaluation today patient appears to be doing fairly well in regard to the wound on his heel as well as what appears to be a new wound of the left first metatarsal head plantar aspect. This seems to be an area that was callus that has split as the patient tells me has been trying to walk on his toes more has probably where this came from. With that being said there does not appear to be signs of active infection which is great news. 12/17/2020 upon evaluation today patient actually appears to be making good progress currently. Fortunately there is no evidence of active infection at this time. Overall I feel like he is very close to complete closure. Objective Constitutional Well-nourished and well-hydrated in no acute distress. Vitals Time Taken: 1:40 PM, Height: 70 in, Weight: 262 lbs, BMI: 37.6, Temperature: 98.3 F, Pulse: 80 bpm, Respiratory Rate: 16 breaths/min, Blood Pressure:  127/77 mmHg. Respiratory normal breathing without difficulty. Psychiatric [name], [name] ([id number]) this patient is able to make decisions and demonstrates good insight into disease process. Alert and Oriented x 3. pleasant and cooperative. General Notes: Patient's wound bed actually showed signs again on the heel wound being almost completely closed which is excellent news. In regard to the area underneath his first toe there is a lot of callus buildup but the actual open area is minimal. I think that he is very close to complete resolution in both locations. He actually 2 weeks will be going to the beach. Integumentary (Hair, Skin) Wound #1 status is Open. Original cause of wound was Gradually Appeared. The date acquired was: 09/24/1998. The wound has been in treatment 12 weeks. The wound is located on the Left Calcaneus. The wound measures 0.2cm length x 0.2cm width x 0.1cm depth; 0.031cm^2 area and 0.003cm^3 volume. There is Fat Layer (Subcutaneous Tissue) exposed. There is no tunneling or undermining noted. There is a none present amount of drainage noted. The wound margin is flat and intact. There is medium (34-66%) pink granulation within the wound bed. There is a medium (34-66%) amount of necrotic tissue within the wound bed including Adherent Slough. Wound #3 status is Open. Original cause of wound was Gradually Appeared. The date acquired was: 12/08/2020. The wound has been in treatment 1 weeks. The wound is located on the Left Metatarsal head first. The wound measures 0.2cm length x 1.3cm width x 0.2cm depth; 0.204cm^2 area and 0.041cm^3 volume. There is Fat Layer (Subcutaneous Tissue) exposed. There is no tunneling or undermining noted. There is a medium amount of serosanguineous drainage noted. There is no granulation  within the wound bed. There is no necrotic tissue within the wound bed. 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 There was a Excisional Skin/Subcutaneous Tissue Debridement with a total area of 1 sq cm performed by [name]., PA-C. With the following instrument(s): Curette to remove Viable and Non-Viable tissue/material. Material removed includes Callus and Subcutaneous Tissue and. A time out was conducted at 14:12, prior to the start of the procedure. A Minimum amount of bleeding was controlled with Pressure. The procedure was tolerated well. Post Debridement Measurements: 0.2cm length x 1.3cm width x 0.2cm depth; 0.041cm^3 volume. Character of Wound/Ulcer Post Debridement is stable. Severity of Tissue Post Debridement is: Fat layer exposed. Post procedure Diagnosis Wound #3: Same as Pre-Procedure Plan Follow-up Appointments: Wound #1 Left Calcaneus: Return Appointment in 1 week. Wound #3 Left Metatarsal head first: Return Appointment in 1 week. Edema Control - Lymphedema / Segmental Compressive Device / Other: Wound #1 Left Calcaneus: Elevate legs to the level of the heart and pump ankles as often as possible Elevate leg(s) parallel to the floor when sitting. Wound #3 Left Metatarsal head first: Elevate legs to the level of the heart and pump ankles as often as possible Elevate leg(s) parallel to the floor when sitting. WOUND #1: - Calcaneus Wound Laterality: Left Cleanser: Normal Saline (Generic) 3 x Per Week/30 Days Discharge Instructions: Wash your hands with soap and water. Remove old dressing, discard into plastic bag and place into trash. Cleanse the wound with Normal Saline prior to applying a  clean dressing using gauze sponges, not tissues or cotton balls. Do not scrub or use excessive force. Pat dry using gauze sponges, not tissue or cotton balls. Primary Dressing: Silvercel Small 2x2 (in/in) (Generic) 3 x Per Week/30 Days [name], [name] ([id number]) Discharge Instructions: Apply Silvercel Small 2x2 (in/in) as instructed Secondary Dressing: Coverlet Latex-Free Fabric Adhesive Dressings (Generic) 3 x Per Week/30 Days Discharge Instructions: 1.5 x 2 WOUND #3: - Metatarsal head first Wound Laterality: Left Cleanser: Normal Saline (Generic) 3 x Per Week/30 Days Discharge Instructions: Wash your hands with soap and water. Remove old dressing, discard into plastic bag and place into trash. Cleanse the wound with Normal Saline prior to applying a clean dressing using gauze sponges, not tissues or cotton balls. Do not scrub or use excessive force. Pat dry using gauze sponges, not tissue or cotton balls. Primary Dressing: Silvercel Small 2x2 (in/in) (Generic) 3 x Per Week/30 Days Discharge Instructions: Apply Silvercel Small 2x2 (in/in) as instructed Secondary Dressing: Coverlet Latex-Free Fabric Adhesive Dressings (Generic) 3 x Per Week/30 Days Discharge Instructions: 1.5 x 2 1. Would recommend currently that we going continue with the wound care measures as before and the patient is going to continue with the alginate which I think is keeping things clean and dry. 2. I am also can recommend at this time that we have the patient go ahead and continue with the dressing changes on a regular basis which seem to have done well up to this point. 3. I am also can recommend the patient continue to monitor for any signs of worsening  infection. If anything changes he should let me know. We will see patient back for reevaluation in 1 week here in the clinic. If anything worsens or changes patient will contact our office for additional recommendations. Electronic Signature(s) Signed: 12/17/2020  2:54:41 PM By: Worthy Keeler PA-C Entered By: Worthy Keeler on 12/17/2020 14:54:40 [name] ([id number]) -------------------------------------------------------------------------------- SuperBill Details Patient Name: [name], [name]. Date of Service: 12/17/2020 Medical Record Number: [id number] Patient Account Number: [id number] Date of Birth/Sex: 06/03/1946 (75 y.o. 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 Z95.4 Presence of other heart-valve replacement Z79.01 Long term (current) use of anticoagulants Facility Procedures CPT4 Code: [id number] Description: 95188 - DEB SUBQ TISSUE 20 SQ CM/< Modifier: Quantity: 1 CPT4 Code: Description: ICD-10 Diagnosis Description E11.621 Type 2 diabetes mellitus with foot ulcer Modifier: Quantity: Physician Procedures CPT4 Code: 4166063 Description: 01601 - WC PHYS SUBQ TISS 20 SQ CM Modifier: Quantity: 1 CPT4 Code: Description: ICD-10 Diagnosis Description E11.621 Type 2 diabetes mellitus with foot ulcer Modifier: Quantity: Electronic Signature(s) Signed: 12/17/2020 2:55:02 PM By: Worthy Keeler PA-C Entered By: Worthy Keeler on 12/17/2020 14:55:01

## 2020-12-18 NOTE — Progress Notes (Signed)
STRIDER, VALLANCE ([id number]) Visit Report for 12/17/2020 Arrival Information Details Patient Name: [name], [name]. Date of Service: 12/17/2020 1:45 PM Medical Record Number: [id number] Patient Account Number: [id number] Date of Birth/Sex: 12-07-1945 (75 y.o. M) Treating RN: Dolan Amen Primary Care Provider: Fulton Reek Other Clinician: Referring Provider: Fulton Reek Treating Provider/Extender: Skipper Cliche in Treatment: 12 Visit Information History Since Last Visit Added or deleted any medications: No Patient Arrived: [name] Had a fall or experienced change in No Arrival Time: 13:38 activities of daily living that may affect Accompanied By: self risk of falls: Transfer Assistance: None Hospitalized since last visit: No Patient Identification Verified: Yes Pain Present Now: No Secondary Verification Process Completed: Yes Patient Requires Transmission-Based Precautions: No Patient Has Alerts: Yes Patient Alerts: Type II Diabetic 81 mg Aspirin Warfarin Electronic Signature(s) Signed: 12/17/2020 2:13:05 PM By: Jeanine Luz Entered By: Jeanine Luz on 12/17/2020 13:39:21 [name] ([id number]) -------------------------------------------------------------------------------- Clinic Level of Care Assessment Details Patient Name: [name]. Date of Service: 12/17/2020 1:45 PM Medical Record Number: [id number] Patient Account Number: [id number] Date of Birth/Sex: 20-Sep-1946 (75 y.o. M) Treating RN: Dolan Amen Primary Care Provider: Fulton Reek Other Clinician: Referring Provider: Fulton Reek Treating Provider/Extender: Skipper Cliche in Treatment: 12 Clinic Level of Care Assessment Items TOOL 1 Quantity Score _0  - Use when EandM and Procedure is performed on INITIAL visit 0 ASSESSMENTS - Nursing Assessment / Reassessment _1  - General Physical Exam (combine w/ comprehensive assessment (listed just below) when performed on  new 0 pt. evals) _2  - 0 Comprehensive Assessment (HX, ROS, Risk Assessments, Wounds Hx, etc.) ASSESSMENTS - Wound and Skin Assessment / Reassessment _3  - Dermatologic / Skin Assessment (not related to wound area) 0 ASSESSMENTS - Ostomy and/or Continence Assessment and Care _4  - Incontinence Assessment and Management 0 _5  - 0 Ostomy Care Assessment and Management (repouching, etc.) PROCESS - Coordination of Care _6  - Simple Patient / Family Education for ongoing care 0 _7  - 0 Complex (extensive) Patient / Family Education for ongoing care _8  - 0 Staff obtains Programmer, systems, Records, Test Results / Process Orders _9  - 0 Staff telephones HHA, Nursing Homes / Clarify orders / etc _10  - 0 Routine Transfer to another Facility (non-emergent condition) _11  - 0 Routine Hospital Admission (non-emergent condition) _12  - 0 New Admissions / Biomedical engineer / Ordering NPWT, Apligraf, etc. _13  - 0 Emergency Hospital Admission (emergent condition) PROCESS - Special Needs _14  - Pediatric / Minor Patient Management 0 _15  - 0 Isolation Patient Management _16  - 0 Hearing / Language / Visual special needs _17  - 0 Assessment of Community assistance (transportation, D/C planning, etc.) _18  - 0 Additional assistance / Altered mentation _19  - 0 Support Surface(s) Assessment (bed, cushion, seat, etc.) INTERVENTIONS - Miscellaneous _20  - External ear exam 0 _21  - 0 Patient Transfer (multiple staff / Civil Service fast streamer / Similar devices) _22  - 0 Simple Staple / Suture removal (25 or less) _23  - 0 Complex Staple / Suture removal (26 or more) _24  - 0 Hypo/Hyperglycemic Management (do not check if billed separately) _25  - 0 Ankle / Brachial Index (ABI) - do not check if billed separately Has the patient been seen at the hospital within the last three years: Yes Total Score: 0 Level Of Care: ____ [name] ([id number]) Electronic Signature(s) Signed: 12/17/2020 5:03:53 PM By: Georges Mouse, Minus Breeding  RN Entered By: Georges Mouse, Minus Breeding on 12/17/2020 14:16:44 [name] ([id number]) -------------------------------------------------------------------------------- Encounter Discharge Information Details Patient Name: [name], [name]. Date of  Service: 12/17/2020 1:45 PM Medical Record Number: [id number] Patient Account Number: [id number] Date of Birth/Sex: 07/30/1946 (75 y.o. M) Treating RN: Rogers Blocker Primary Care Provider: [name] Other Clinician: Referring Provider: [name] Treating Provider/Extender: Rowan Blase in Treatment: 12 Encounter Discharge Information Items Post Procedure Vitals Discharge Condition: Stable Temperature (F): 98.3 Ambulatory Status: Cane Pulse (bpm): 80 Discharge Destination: Home Respiratory Rate (breaths/min): 16 Transportation: Private Auto Blood Pressure (mmHg): 127/77 Accompanied By: self Schedule Follow-up Appointment: Yes Clinical Summary of Care: Patient Declined Electronic Signature(s) Signed: 12/17/2020 4:52:45 PM By: [name] RN Entered By: [name] on 12/17/2020 14:29:30 Barrie Folk ([id number]) -------------------------------------------------------------------------------- Lower Extremity Assessment Details Patient Name: [name], [name]. Date of Service: 12/17/2020 1:45 PM Medical Record Number: [id number] Patient Account Number: [id number] Date of Birth/Sex: 1946-07-13 (75 y.o. M) Treating RN: Rogers Blocker Primary Care Provider: [name] Other Clinician: Referring Provider: [name] Treating Provider/Extender: Rowan Blase in Treatment: 12 Edema Assessment Assessed: [Left: Yes] [Right: No] Edema: [Left: Ye] [Right: s] Calf Left: Right: Point of Measurement: 38 cm From Medial Instep 41.5 cm Ankle Left: Right: Point of Measurement: 9 cm From Medial Instep 24 cm Vascular Assessment Pulses: Dorsalis Pedis Palpable: [Left:Yes] Electronic Signature(s) Signed:  12/17/2020 2:13:05 PM By: Lolita Cram Signed: 12/17/2020 5:03:53 PM By: Phillis Haggis, Dondra Prader RN Entered By: Lolita Cram on 12/17/2020 14:01:05 Barrie Folk ([id number]) -------------------------------------------------------------------------------- Multi Wound Chart Details Patient Name: [name], [name]. Date of Service: 12/17/2020 1:45 PM Medical Record Number: [id number] Patient Account Number: [id number] Date of Birth/Sex: [birth year] (75 y.o. M) Treating RN: Rogers Blocker Primary Care Provider: [name] Other Clinician: Referring Provider: [name] Treating Provider/Extender: Rowan Blase in Treatment: 12 Vital Signs Height(in): 70 Pulse(bpm): 80 Weight(lbs): 262 Blood Pressure(mmHg): 127/77 Body Mass Index(BMI): 38 Temperature(F): 98.3 Respiratory Rate(breaths/min): 16 Photos: [N/A:N/A] Wound Location: Left Calcaneus Left Metatarsal head first N/A Wounding Event: Gradually Appeared Gradually Appeared N/A Primary Etiology: Diabetic Wound/Ulcer of the Lower Diabetic Wound/Ulcer of the Lower N/A Extremity Extremity Secondary Etiology: Pressure Ulcer N/A N/A Comorbid History: Cataracts, Arrhythmia, Coronary Cataracts, Arrhythmia, Coronary N/A Artery Disease, Hypertension, Type Artery Disease, Hypertension, Type II Diabetes, Osteoarthritis, II Diabetes, Osteoarthritis, Neuropathy Neuropathy Date Acquired: 09/24/1998 12/08/2020 N/A Weeks of Treatment: 12 1 N/A Wound Status: Open Open N/A Measurements L x W x D (cm) 0.2x0.2x0.1 0.2x1.3x0.2 N/A Area (cm) : 0.031 0.204 N/A Volume (cm) : 0.003 0.041 N/A % Reduction in Area: 99.20% -17.90% N/A % Reduction in Volume: 99.30% -17.10% N/A Classification: Grade 2 Grade 2 N/A Exudate Amount: None Present Medium N/A Exudate Type: N/A Serosanguineous N/A Exudate Color: N/A red, brown N/A Wound Margin: Flat and Intact N/A N/A Granulation Amount: Medium (34-66%) None Present (0%) N/A Granulation Quality:  Pink N/A N/A Necrotic Amount: Medium (34-66%) None Present (0%) N/A Exposed Structures: Fat Layer (Subcutaneous Tissue): Fat Layer (Subcutaneous Tissue): N/A Yes Yes Fascia: No Fascia: No Tendon: No Tendon: No Muscle: No Muscle: No Joint: No Joint: No Bone: No Bone: No Epithelialization: Large (67-100%) None N/A Treatment Notes Electronic Signature(s) Signed: 12/17/2020 5:03:53 PM By: Phillis Haggis, Dondra Prader RN Entered By: Phillis Haggis, Dondra Prader on 12/17/2020 14:11:48 JACERE, PANGBORN ([id number]) DARL, KUSS ([id number]) -------------------------------------------------------------------------------- Multi-Disciplinary Care Plan Details Patient Name: ALPHONSA, BRICKLE. Date of Service: 12/17/2020 1:45 PM Medical Record Number: [id number] Patient Account Number: [id number] Date of Birth/Sex: 01/09/1946 (75 y.o. M) Treating RN: Rogers Blocker Primary Care Provider: [name] Other Clinician: Referring Provider: [name] Treating Provider/Extender: Rowan Blase in Treatment:  12 Active Inactive Wound/Skin Impairment Nursing Diagnoses: Impaired tissue integrity Goals: Ulcer/skin breakdown will have a volume reduction of 30% by week 4 Date Initiated: 09/24/2020 Date Inactivated: 11/26/2020 Target Resolution Date: 10/25/2020 Goal Status: Unmet Unmet Reason: comorbites Ulcer/skin breakdown will have a volume reduction of 50% by week 8 Date Initiated: 09/24/2020 Date Inactivated: 12/10/2020 Target Resolution Date: 11/25/2020 Goal Status: Met Ulcer/skin breakdown will have a volume reduction of 80% by week 12 Date Initiated: 09/24/2020 Target Resolution Date: 12/23/2020 Goal Status: Active Ulcer/skin breakdown will heal within 14 weeks Date Initiated: 09/24/2020 Target Resolution Date: 01/13/2021 Goal Status: Active Interventions: Assess patient/caregiver ability to obtain necessary supplies Assess patient/caregiver ability to perform ulcer/skin care regimen  upon admission and as needed Treatment Activities: Referred to DME provider for dressing supplies : 09/24/2020 Skin care regimen initiated : 09/24/2020 Topical wound management initiated : 09/24/2020 Notes: Electronic Signature(s) Signed: 12/17/2020 5:03:53 PM By: Georges Mouse, Minus Breeding RN Entered By: Georges Mouse, Minus Breeding on 12/17/2020 14:11:19 [name] ([id number]) -------------------------------------------------------------------------------- Pain Assessment Details Patient Name: OLUWATIMILEHIN, BALFOUR. Date of Service: 12/17/2020 1:45 PM Medical Record Number: [id number] Patient Account Number: [id number] Date of Birth/Sex: 07/12/1946 (75 y.o. M) Treating RN: Dolan Amen Primary Care Provider: Fulton Reek Other Clinician: Referring Provider: Fulton Reek Treating Provider/Extender: Skipper Cliche in Treatment: 12 Active Problems Location of Pain Severity and Description of Pain Patient Has Paino No Site Locations Pain Management and Medication Current Pain Management: Electronic Signature(s) Signed: 12/17/2020 2:13:05 PM By: Jeanine Luz Signed: 12/17/2020 5:03:53 PM By: Georges Mouse, Minus Breeding RN Entered By: Jeanine Luz on 12/17/2020 13:41:15 [name] ([id number]) -------------------------------------------------------------------------------- Patient/Caregiver Education Details Patient Name: EMELIO, SCHNELLER. Date of Service: 12/17/2020 1:45 PM Medical Record Number: [id number] Patient Account Number: [id number] Date of Birth/Gender: Dec 21, 1945 (74 y.o. M) Treating RN: Dolan Amen Primary Care Physician: Fulton Reek Other Clinician: Referring Physician: Fulton Reek Treating Physician/Extender: Skipper Cliche in Treatment: 12 Education Assessment Education Provided To: Patient Education Topics Provided Wound/Skin Impairment: Methods: Explain/Verbal Responses: State content correctly Electronic Signature(s) Signed: 12/17/2020  5:03:53 PM By: Georges Mouse, Minus Breeding RN Entered By: Georges Mouse, Minus Breeding on 12/17/2020 14:17:05 [name] ([id number]) -------------------------------------------------------------------------------- Wound Assessment Details Patient Name: [name], [name]. Date of Service: 12/17/2020 1:45 PM Medical Record Number: [id number] Patient Account Number: [id number] Date of Birth/Sex: July 08, 1946 (75 y.o. M) Treating RN: Dolan Amen Primary Care Provider: Fulton Reek Other Clinician: Referring Provider: Fulton Reek Treating Provider/Extender: Skipper Cliche in Treatment: 12 Wound Status Wound Number: 1 Primary Diabetic Wound/Ulcer of the Lower Extremity Etiology: Wound Location: Left Calcaneus Secondary Pressure Ulcer Wounding Event: Gradually Appeared Etiology: Date Acquired: 09/24/1998 Wound Open Weeks Of Treatment: 12 Status: Clustered Wound: No Comorbid Cataracts, Arrhythmia, Coronary Artery Disease, History: Hypertension, Type II Diabetes, Osteoarthritis, Neuropathy Photos Wound Measurements Length: (cm) 0.2 Width: (cm) 0.2 Depth: (cm) 0.1 Area: (cm) 0.031 Volume: (cm) 0.003 % Reduction in Area: 99.2% % Reduction in Volume: 99.3% Epithelialization: Large (67-100%) Tunneling: No Undermining: No Wound Description Classification: Grade 2 Wound Margin: Flat and Intact Exudate Amount: None Present Foul Odor After Cleansing: No Slough/Fibrino No Wound Bed Granulation Amount: Medium (34-66%) Exposed Structure Granulation Quality: Pink Fascia Exposed: No Necrotic Amount: Medium (34-66%) Fat Layer (Subcutaneous Tissue) Exposed: Yes Necrotic Quality: Adherent Slough Tendon Exposed: No Muscle Exposed: No Joint Exposed: No Bone Exposed: No Treatment Notes Wound #1 (Calcaneus) Wound Laterality: Left Cleanser Normal Saline Discharge Instruction: Wash your hands with soap and water. Remove old dressing, discard into plastic bag and place  into  trash. Cleanse the wound with Normal Saline prior to applying a clean dressing using gauze sponges, not tissues or cotton balls. Do not KNUT, RONDINELLI ([id number]) scrub or use excessive force. Pat dry using gauze sponges, not tissue or cotton balls. Peri-Wound Care Topical Primary Dressing Silvercel Small 2x2 (in/in) Discharge Instruction: Apply Silvercel Small 2x2 (in/in) as instructed Secondary Dressing Coverlet Latex-Free Fabric Adhesive Dressings Discharge Instruction: 1.5 x 2 Secured With Compression Wrap Compression Stockings Add-Ons Electronic Signature(s) Signed: 12/17/2020 2:13:05 PM By: Jeanine Luz Signed: 12/17/2020 5:03:53 PM By: Georges Mouse, Minus Breeding RN Entered By: Jeanine Luz on 12/17/2020 14:00:15 [name] ([id number]) -------------------------------------------------------------------------------- Wound Assessment Details Patient Name: KAYDE, [name]. Date of Service: 12/17/2020 1:45 PM Medical Record Number: [id number] Patient Account Number: [id number] Date of Birth/Sex: [birth year] (75 y.o. M) Treating RN: Dolan Amen Primary Care Provider: Fulton Reek Other Clinician: Referring Provider: Fulton Reek Treating Provider/Extender: Skipper Cliche in Treatment: 12 Wound Status Wound Number: 3 Primary Diabetic Wound/Ulcer of the Lower Extremity Etiology: Wound Location: Left Metatarsal head first Wound Open Wounding Event: Gradually Appeared Status: Date Acquired: 12/08/2020 Comorbid Cataracts, Arrhythmia, Coronary Artery Disease, Weeks Of Treatment: 1 History: Hypertension, Type II Diabetes, Osteoarthritis, Clustered Wound: No Neuropathy Photos Wound Measurements Length: (cm) 0.2 Width: (cm) 1.3 Depth: (cm) 0.2 Area: (cm) 0.204 Volume: (cm) 0.041 % Reduction in Area: -17.9% % Reduction in Volume: -17.1% Epithelialization: None Tunneling: No Undermining: No Wound Description Classification: Grade 2 Exudate Amount:  Medium Exudate Type: Serosanguineous Exudate Color: red, brown Foul Odor After Cleansing: No Slough/Fibrino No Wound Bed Granulation Amount: None Present (0%) Exposed Structure Necrotic Amount: None Present (0%) Fascia Exposed: No Fat Layer (Subcutaneous Tissue) Exposed: Yes Tendon Exposed: No Muscle Exposed: No Joint Exposed: No Bone Exposed: No Treatment Notes Wound #3 (Metatarsal head first) Wound Laterality: Left Cleanser Normal Saline Discharge Instruction: Wash your hands with soap and water. Remove old dressing, discard into plastic bag and place into trash. Cleanse the wound with Normal Saline prior to applying a clean dressing using gauze sponges, not tissues or cotton balls. Do not scrub or use excessive force. Pat dry using gauze sponges, not tissue or cotton balls. ZACHAREE, GADDIE ([id number]) Peri-Wound Care Topical Primary Dressing Silvercel Small 2x2 (in/in) Discharge Instruction: Apply Silvercel Small 2x2 (in/in) as instructed Secondary Dressing Coverlet Latex-Free Fabric Adhesive Dressings Discharge Instruction: 1.5 x 2 Secured With Compression Wrap Compression Stockings Add-Ons Electronic Signature(s) Signed: 12/17/2020 2:13:05 PM By: Jeanine Luz Signed: 12/17/2020 5:03:53 PM By: Georges Mouse, Minus Breeding RN Entered By: Jeanine Luz on 12/17/2020 13:58:49 [name] ([id number]) -------------------------------------------------------------------------------- Vitals Details Patient Name: [name], [name]. Date of Service: 12/17/2020 1:45 PM Medical Record Number: [id number] Patient Account Number: [id number] Date of Birth/Sex: December 07, 1945 (76 y.o. M) Treating RN: Dolan Amen Primary Care Provider: Fulton Reek Other Clinician: Referring Provider: Fulton Reek Treating Provider/Extender: Skipper Cliche in Treatment: 12 Vital Signs Time Taken: 13:40 Temperature (F): 98.3 Height (in): 70 Pulse (bpm): 80 Weight (lbs):  262 Respiratory Rate (breaths/min): 16 Body Mass Index (BMI): 37.6 Blood Pressure (mmHg): 127/77 Reference Range: 80 - 120 mg / dl Electronic Signature(s) Signed: 12/17/2020 2:13:05 PM By: Jeanine Luz Entered By: Jeanine Luz on 12/17/2020 13:41:09

## 2020-12-26 ENCOUNTER — Other Ambulatory Visit: Payer: Self-pay

## 2020-12-26 ENCOUNTER — Encounter: Payer: Medicare HMO | Admitting: Physician Assistant

## 2020-12-26 DIAGNOSIS — L89623 Pressure ulcer of left heel, stage 3: Secondary | ICD-10-CM | POA: Diagnosis not present

## 2020-12-26 NOTE — Progress Notes (Addendum)
LAMAR, METER ([id number]) Visit Report for 12/26/2020 Arrival Information Details Patient Name: [name], [name]. Date of Service: 12/26/2020 9:15 AM Medical Record Number: [id number] Patient Account Number: [id number] Date of Birth/Sex: November 24, 1945 (75 y.o. M) Treating RN: Dolan Amen Primary Care Noelle Hoogland: Fulton Reek Other Clinician: Jeanine Luz Referring Massiel Stipp: Fulton Reek Treating Kjell Brannen/Extender: Skipper Cliche in Treatment: 78 Visit Information History Since Last Visit Added or deleted any medications: No Patient Arrived: [name] Had a fall or experienced change in No Arrival Time: 09:21 activities of daily living that may affect Accompanied By: self risk of falls: Transfer Assistance: None Hospitalized since last visit: No Patient Identification Verified: Yes Pain Present Now: No Secondary Verification Process Completed: Yes Patient Requires Transmission-Based Precautions: No Patient Has Alerts: Yes Patient Alerts: Type II Diabetic 81 mg Aspirin Warfarin Electronic Signature(s) Signed: 12/30/2020 11:47:12 AM By: Jeanine Luz Entered By: Jeanine Luz on 12/26/2020 09:26:44 [name] ([id number]) -------------------------------------------------------------------------------- Clinic Level of Care Assessment Details Patient Name: [name]. Date of Service: 12/26/2020 9:15 AM Medical Record Number: [id number] Patient Account Number: [id number] Date of Birth/Sex: [birth year] (75 y.o. M) Treating RN: Dolan Amen Primary Care Jere Bostrom: Fulton Reek Other Clinician: Jeanine Luz Referring Teagan Ozawa: Fulton Reek Treating Obryan Radu/Extender: Skipper Cliche in Treatment: 13 Clinic Level of Care Assessment Items TOOL 1 Quantity Score []  - Use when EandM and Procedure is performed on INITIAL visit 0 ASSESSMENTS - Nursing Assessment / Reassessment []  - General Physical Exam (combine w/ comprehensive assessment (listed  just below) when performed on new 0 pt. evals) []  - 0 Comprehensive Assessment (HX, ROS, Risk Assessments, Wounds Hx, etc.) ASSESSMENTS - Wound and Skin Assessment / Reassessment []  - Dermatologic / Skin Assessment (not related to wound area) 0 ASSESSMENTS - Ostomy and/or Continence Assessment and Care []  - Incontinence Assessment and Management 0 []  - 0 Ostomy Care Assessment and Management (repouching, etc.) PROCESS - Coordination of Care []  - Simple Patient / Family Education for ongoing care 0 []  - 0 Complex (extensive) Patient / Family Education for ongoing care []  - 0 Staff obtains Programmer, systems, Records, Test Results / Process Orders []  - 0 Staff telephones HHA, Nursing Homes / Clarify orders / etc []  - 0 Routine Transfer to another Facility (non-emergent condition) []  - 0 Routine Hospital Admission (non-emergent condition) []  - 0 New Admissions / Biomedical engineer / Ordering NPWT, Apligraf, etc. []  - 0 Emergency Hospital Admission (emergent condition) PROCESS - Special Needs []  - Pediatric / Minor Patient Management 0 []  - 0 Isolation Patient Management []  - 0 Hearing / Language / Visual special needs []  - 0 Assessment of Community assistance (transportation, D/C planning, etc.) []  - 0 Additional assistance / Altered mentation []  - 0 Support Surface(s) Assessment (bed, cushion, seat, etc.) INTERVENTIONS - Miscellaneous []  - External ear exam 0 []  - 0 Patient Transfer (multiple staff / Civil Service fast streamer / Similar devices) []  - 0 Simple Staple / Suture removal (25 or less) []  - 0 Complex Staple / Suture removal (26 or more) []  - 0 Hypo/Hyperglycemic Management (do not check if billed separately) []  - 0 Ankle / Brachial Index (ABI) - do not check if billed separately Has the patient been seen at the hospital within the last three years: Yes Total Score: 0 Level Of Care: ____ [name] ([id number]) Electronic Signature(s) Signed: 12/26/2020 4:13:11 PM By:  Georges Mouse, Minus Breeding RN Entered By: Georges Mouse, Minus Breeding on 12/26/2020 10:05:28 [name] ([id number]) -------------------------------------------------------------------------------- Encounter Discharge Information Details Patient Name: [name],  [name] J. Date of Service: 12/26/2020 9:15 AM Medical Record Number: [id number] Patient Account Number: [id number] Date of Birth/Sex: 1946-07-15 (75 y.o. M) Treating RN: Dolan Amen Primary Care Vrishank Moster: Fulton Reek Other Clinician: Jeanine Luz Referring Semira Stoltzfus: Fulton Reek Treating Cia Garretson/Extender: Skipper Cliche in Treatment: 13 Encounter Discharge Information Items Discharge Condition: Stable Ambulatory Status: Cane Discharge Destination: Home Transportation: Private Auto Accompanied By: self Schedule Follow-up Appointment: No Clinical Summary of Care: Electronic Signature(s) Signed: 12/26/2020 4:13:11 PM By: Georges Mouse, Minus Breeding RN Entered By: Georges Mouse, Minus Breeding on 12/26/2020 10:07:10 [name] ([id number]) -------------------------------------------------------------------------------- Lower Extremity Assessment Details Patient Name: [name], [name]. Date of Service: 12/26/2020 9:15 AM Medical Record Number: [id number] Patient Account Number: [id number] Date of Birth/Sex: [birth year] (75 y.o. M) Treating RN: Dolan Amen Primary Care Vitali Seibert: Fulton Reek Other Clinician: Jeanine Luz Referring Damani Kelemen: Fulton Reek Treating Kaysen Sefcik/Extender: Skipper Cliche in Treatment: 13 Edema Assessment Assessed: [Left: No] Patrice Paradise: No] [Left: Edema] [Right: :] Calf Left: Right: Point of Measurement: 38 cm From Medial Instep 41.3 cm Ankle Left: Right: Point of Measurement: 9 cm From Medial Instep 24.5 cm Vascular Assessment Pulses: Dorsalis Pedis Palpable: [Left:Yes] Electronic Signature(s) Signed: 12/26/2020 4:13:11 PM By: Georges Mouse, Minus Breeding RN Signed: 12/30/2020  11:47:12 AM By: Jeanine Luz Entered By: Jeanine Luz on 12/26/2020 09:36:15 [name] ([id number]) -------------------------------------------------------------------------------- Multi Wound Chart Details Patient Name: [name], [name]. Date of Service: 12/26/2020 9:15 AM Medical Record Number: [id number] Patient Account Number: [id number] Date of Birth/Sex: 1946-07-01 (75 y.o. M) Treating RN: Dolan Amen Primary Care Shadow Stiggers: Fulton Reek Other Clinician: Jeanine Luz Referring Rhyan Wolters: Fulton Reek Treating Amani Nodarse/Extender: Skipper Cliche in Treatment: 13 Vital Signs Height(in): 38 Pulse(bpm): 32 Weight(lbs): 42 Blood Pressure(mmHg): 144/83 Body Mass Index(BMI): 38 Temperature(F): 98.0 Respiratory Rate(breaths/min): 18 Photos: [1:No Photos] [3:No Photos] [N/A:N/A] Wound Location: [1:Left Calcaneus] [3:Left Metatarsal head first] [N/A:N/A] Wounding Event: [1:Gradually Appeared] [3:Gradually Appeared] [N/A:N/A] Primary Etiology: [1:Diabetic Wound/Ulcer of the Lower Extremity] [3:Diabetic Wound/Ulcer of the Lower Extremity] [N/A:N/A] Secondary Etiology: [1:Pressure Ulcer] [3:N/A] [N/A:N/A] Comorbid History: [1:Cataracts, Arrhythmia, Coronary Artery Disease, Hypertension, Type II Diabetes, Osteoarthritis, Neuropathy] [3:Cataracts, Arrhythmia, Coronary Artery Disease, Hypertension, Type II Diabetes, Osteoarthritis, Neuropathy] [N/A:N/A] Date Acquired: [1:09/24/1998] [3:12/08/2020] [N/A:N/A] Weeks of Treatment: [1:13] [3:2] [N/A:N/A] Wound Status: [1:Healed - Epithelialized] [3:Healed - Epithelialized] [N/A:N/A] Measurements L x W x D (cm) [1:0x0x0] [3:0x0x0] [N/A:N/A] Area (cm) : [1:0] [3:0] [N/A:N/A] Volume (cm) : [1:0] [3:0] [N/A:N/A] % Reduction in Area: [1:100.00%] [3:100.00%] [N/A:N/A] % Reduction in Volume: [1:100.00%] [3:100.00%] [N/A:N/A] Classification: [1:Grade 2] [3:Grade 2] [N/A:N/A] Exudate Amount: [1:None Present] [3:None Present]  [N/A:N/A] Wound Margin: [1:Flat and Intact] [3:N/A] [N/A:N/A] Granulation Amount: [1:None Present (0%)] [3:None Present (0%)] [N/A:N/A] Necrotic Amount: [1:None Present (0%)] [3:None Present (0%)] [N/A:N/A] Exposed Structures: [1:Fascia: No Fat Layer (Subcutaneous Tissue): No Tendon: No Muscle: No Joint: No Bone: No Large (67-100%)] [3:Fascia: No Fat Layer (Subcutaneous Tissue): No Tendon: No Muscle: No Joint: No Bone: No Large (67-100%)] [N/A:N/A N/A] Treatment Notes Electronic Signature(s) Signed: 12/26/2020 4:13:11 PM By: Georges Mouse, Minus Breeding RN Entered By: Georges Mouse, Minus Breeding on 12/26/2020 10:03:14 [name] ([id number]) -------------------------------------------------------------------------------- [place] Details Patient Name: [name], WALKINS. Date of Service: 12/26/2020 9:15 AM Medical Record Number: [id number] Patient Account Number: [id number] Date of Birth/Sex: [birth year] (75 y.o. M) Treating RN: Dolan Amen Primary Care Gabryel Talamo: Fulton Reek Other Clinician: Jeanine Luz Referring Miyeko Mahlum: Fulton Reek Treating Irelynd Zumstein/Extender: Skipper Cliche in Treatment: 13 Active Inactive Electronic Signature(s) Signed: 12/26/2020 4:13:11 PM By: Charlett Nose RN Entered  By: Georges Mouse, Minus Breeding on 12/26/2020 10:01:55 [name] ([id number]) -------------------------------------------------------------------------------- Pain Assessment Details Patient Name: [name], ISABELL. Date of Service: 12/26/2020 9:15 AM Medical Record Number: [id number] Patient Account Number: [id number] Date of Birth/Sex: 07-05-1946 (75 y.o. M) Treating RN: Dolan Amen Primary Care Tambi Thole: Fulton Reek Other Clinician: Jeanine Luz Referring Breylen Agyeman: Fulton Reek Treating Kawthar Ennen/Extender: Skipper Cliche in Treatment: 13 Active Problems Location of Pain Severity and Description of Pain Patient Has Paino No Site  Locations Pain Management and Medication Current Pain Management: Electronic Signature(s) Signed: 12/26/2020 4:13:11 PM By: Georges Mouse, Minus Breeding RN Signed: 12/30/2020 11:47:12 AM By: Jeanine Luz Entered By: Jeanine Luz on 12/26/2020 09:27:15 [name] ([id number]) -------------------------------------------------------------------------------- Patient/Caregiver Education Details Patient Name: [name], FONSECA. Date of Service: 12/26/2020 9:15 AM Medical Record Number: [id number] Patient Account Number: [id number] Date of Birth/Gender: 1945/12/11 (75 y.o. M) Treating RN: Dolan Amen Primary Care Physician: Fulton Reek Other Clinician: Jeanine Luz Referring Physician: Fulton Reek Treating Physician/Extender: Skipper Cliche in Treatment: 13 Education Assessment Education Provided To: Patient Education Topics Provided Notes Educated on protecting new skin when out in the pool Electronic Signature(s) Signed: 12/26/2020 4:13:11 PM By: Georges Mouse, Minus Breeding RN Entered By: Georges Mouse, Minus Breeding on 12/26/2020 10:06:39 [name] ([id number]) -------------------------------------------------------------------------------- Wound Assessment Details Patient Name: [name], KAIGLER. Date of Service: 12/26/2020 9:15 AM Medical Record Number: [id number] Patient Account Number: [id number] Date of Birth/Sex: 14-May-1946 (75 y.o. M) Treating RN: Dolan Amen Primary Care Gael Londo: Fulton Reek Other Clinician: Jeanine Luz Referring Alonzo Owczarzak: Fulton Reek Treating Sarinity Dicicco/Extender: Skipper Cliche in Treatment: 13 Wound Status Wound Number: 1 Primary Diabetic Wound/Ulcer of the Lower Extremity Etiology: Wound Location: Left Calcaneus Secondary Pressure Ulcer Wounding Event: Gradually Appeared Etiology: Date Acquired: 09/24/1998 Wound Healed - Epithelialized Weeks Of Treatment: 13 Status: Clustered Wound: No Comorbid Cataracts,  Arrhythmia, Coronary Artery Disease, History: Hypertension, Type II Diabetes, Osteoarthritis, Neuropathy Photos Photo Uploaded By: Jeanine Luz on 12/26/2020 12:11:10 Wound Measurements Length: (cm) 0 Width: (cm) 0 Depth: (cm) 0 Area: (cm) 0 Volume: (cm) 0 % Reduction in Area: 100% % Reduction in Volume: 100% Epithelialization: Large (67-100%) Tunneling: No Undermining: No Wound Description Classification: Grade 2 Wound Margin: Flat and Intact Exudate Amount: None Present Foul Odor After Cleansing: No Slough/Fibrino No Wound Bed Granulation Amount: None Present (0%) Exposed Structure Necrotic Amount: None Present (0%) Fascia Exposed: No Fat Layer (Subcutaneous Tissue) Exposed: No Tendon Exposed: No Muscle Exposed: No Joint Exposed: No Bone Exposed: No Electronic Signature(s) Signed: 12/26/2020 4:13:11 PM By: Georges Mouse, Minus Breeding RN Entered By: Georges Mouse, Minus Breeding on 12/26/2020 10:01:35 [name] ([id number]) -------------------------------------------------------------------------------- Wound Assessment Details Patient Name: [name], [name]. Date of Service: 12/26/2020 9:15 AM Medical Record Number: [id number] Patient Account Number: [id number] Date of Birth/Sex: August 15, 1946 (75 y.o. M) Treating RN: Dolan Amen Primary Care Hillary Struss: Fulton Reek Other Clinician: Jeanine Luz Referring Farrel Guimond: Fulton Reek Treating Linn Goetze/Extender: Skipper Cliche in Treatment: 13 Wound Status Wound Number: 3 Primary Diabetic Wound/Ulcer of the Lower Extremity Etiology: Wound Location: Left Metatarsal head first Wound Healed - Epithelialized Wounding Event: Gradually Appeared Status: Date Acquired: 12/08/2020 Comorbid Cataracts, Arrhythmia, Coronary Artery Disease, Weeks Of Treatment: 2 History: Hypertension, Type II Diabetes, Osteoarthritis, Clustered Wound: No Neuropathy Photos Photo Uploaded By: Jeanine Luz on 12/26/2020  12:11:10 Wound Measurements Length: (cm) 0 Width: (cm) 0 Depth: (cm) 0 Area: (cm) 0 Volume: (cm) 0 % Reduction in Area: 100% % Reduction in Volume: 100% Epithelialization: Large (67-100%) Tunneling: No Undermining: No Wound Description  Classification: Grade 2 Exudate Amount: None Present Foul Odor After Cleansing: No Slough/Fibrino No Wound Bed Granulation Amount: None Present (0%) Exposed Structure Necrotic Amount: None Present (0%) Fascia Exposed: No Fat Layer (Subcutaneous Tissue) Exposed: No Tendon Exposed: No Muscle Exposed: No Joint Exposed: No Bone Exposed: No Electronic Signature(s) Signed: 12/26/2020 4:13:11 PM By: Georges Mouse, Minus Breeding RN Entered By: Georges Mouse, Minus Breeding on 12/26/2020 09:56:38 [name] ([id number]) -------------------------------------------------------------------------------- Vitals Details Patient Name: [name]. Date of Service: 12/26/2020 9:15 AM Medical Record Number: [id number] Patient Account Number: [id number] Date of Birth/Sex: 01/26/1946 (75 y.o. M) Treating RN: Dolan Amen Primary Care Jolane Bankhead: Fulton Reek Other Clinician: Jeanine Luz Referring Saber Dickerman: Fulton Reek Treating Datha Kissinger/Extender: Skipper Cliche in Treatment: 13 Vital Signs Time Taken: 09:26 Temperature (F): 98.0 Height (in): 70 Pulse (bpm): 72 Weight (lbs): 262 Respiratory Rate (breaths/min): 18 Body Mass Index (BMI): 37.6 Blood Pressure (mmHg): 144/83 Reference Range: 80 - 120 mg / dl Electronic Signature(s) Signed: 12/30/2020 11:47:12 AM By: Jeanine Luz Entered By: Jeanine Luz on 12/26/2020 09:27:08

## 2020-12-26 NOTE — Progress Notes (Addendum)
MUHANAD, TOROSYAN ([id number]) Visit Report for 12/26/2020 Chief Complaint Document Details Patient Name: [name], [name]. Date of Service: 12/26/2020 9:15 AM Medical Record Number: [id number] Patient Account Number: [id number] Date of Birth/Sex: November 29, 1945 (75 y.o. M) Treating RN: Dolan Amen Primary Care Provider: Fulton Reek Other Clinician: Jeanine Luz Referring Provider: Fulton Reek Treating Provider/Extender: Skipper Cliche in Treatment: 13 Information Obtained from: Patient Chief Complaint Left heel pressure ulcer and Right LE traumatic ulcers Electronic Signature(s) Signed: 12/26/2020 9:13:32 AM By: Worthy Keeler PA-C Entered By: Worthy Keeler on 12/26/2020 09:13:32 JAYDIS, DUCHENE ([id number]) -------------------------------------------------------------------------------- HPI Details Patient Name: [name], [name]. Date of Service: 12/26/2020 9:15 AM Medical Record Number: [id number] Patient Account Number: [id number] Date of Birth/Sex: [birth year] (75 y.o. M) Treating RN: Dolan Amen Primary Care Provider: Fulton Reek Other Clinician: Jeanine Luz Referring Provider: Fulton Reek Treating Provider/Extender: Skipper Cliche in Treatment: 13 History of Present Illness HPI Description: 09/24/2020 on evaluation today patient presents today for a heel ulcer that he tells me has been present for about 2 years. He has been seeing podiatry and they have been attempting to manage this including what sounds to be a total contact cast, Unna boot, and just standard dressings otherwise as well. Most recently has been using triple antibiotic ointment. With that being said unfortunately despite everything he really has not had any significant improvement. He tells me that he cannot even really remember exactly how this began but he presumed it may have rubbed on his shoes or something of that nature. 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 He tells me that this does seem to help. Fortunately there is no signs of active infection at this time. His most recent hemoglobin A1c was 8.1 though he knows this was this year he cannot tell me the exact time. His fluid pills currently to help with some of the lower extremity edema although he does obviously have signs of venous stasis/lymphedema. Currently there is no evidence of active infection. No fevers, chills, nausea, vomiting, or diarrhea. Patient has had fairly recent ABIs which were performed on 07/19/2020 and revealed that he has normal findings in both the ankle and toe locations bilaterally. His ABI on the right was 1.09 on the left was 1.08 with a TBI on the right of 0.88 and on the left of 0.94. Triphasic flow was noted throughout. 10/08/2020 on evaluation today patient appears to be doing pretty well in regard to his left heel currently in fact this is doing a great job and seems to be healing quite nicely. Unfortunately on his right leg he had a pile of wood that actually fell on him injuring his right leg this is somewhat erythematous has me concerned little bit about cellulitis though there is not really a good area to culture at this point. 10/24/2020 upon evaluation today patient appears to be doing well with regard to his heel ulcer. He is showing signs of improvement which is great news. His right leg is completely healed. Overall I feel like he is doing excellent and there is no signs of infection. 11/07/2020 upon evaluation today patient appears to be doing well with regard to his heel ulcer. He tells me  that last week when he was unable to come in his wife actually thought that the wound was very close to  closing if not closed. Then it began to "reopen again". I really feel like what may have happened as the collagen may have dried over the wound bed and that because that misunderstanding with thinking that the wound was healing. With that being said I did not see it last week I do not know that for certain. Either way I feel like he is doing great today I see no signs of infection at this point. 11/26/2020 upon inspection today patient appears to be doing decently well in regard to his heel ulcer. He has been tolerating the dressing changes without complication. Fortunately there is no sign of active infection at this time. No fevers, chills, nausea, vomiting, or diarrhea. 12/10/2020 upon evaluation today patient appears to be doing fairly well in regard to the wound on his heel as well as what appears to be a new wound of the left first metatarsal head plantar aspect. This seems to be an area that was callus that has split as the patient tells me has been trying to walk on his toes more has probably where this came from. With that being said there does not appear to be signs of active infection which is great news. 12/17/2020 upon evaluation today patient actually appears to be making good progress currently. Fortunately there is no evidence of active infection at this time. Overall I feel like he is very close to complete closure. 12/26/2020 upon evaluation today patient appears to be doing excellent in regard to his wounds. In fact I am not certain that these are not even completely healed on initial inspection. Overall I am very pleased with where things stand today. Good news is that they are healed he is actually get ready to go out of town and that will be helpful as well as he will be a full part of the time. Electronic Signature(s) Signed: 12/26/2020 10:05:11 AM By: Worthy Keeler PA-C Entered By: Worthy Keeler on 12/26/2020 10:05:11 [name], [name]  ([id number]) -------------------------------------------------------------------------------- Callus Pairing Details Patient Name: [name], [name]. Date of Service: 12/26/2020 9:15 AM Medical Record Number: [id number] Patient Account Number: [id number] Date of Birth/Sex: April 20, 1946 (75 y.o. M) Treating RN: Dolan Amen Primary Care Provider: Fulton Reek Other Clinician: Jeanine Luz Referring Provider: Fulton Reek Treating Provider/Extender: Skipper Cliche in Treatment: 13 Procedure Performed for: Non-Wound Location Performed By: Physician Tommie Sams., PA-C Post Procedure Diagnosis Same as Pre-procedure Notes base of Left foot Electronic Signature(s) Signed: 12/26/2020 4:13:11 PM By: Georges Mouse, Minus Breeding RN Entered By: Georges Mouse, Minus Breeding on 12/26/2020 09:57:29 [name] ([id number]) -------------------------------------------------------------------------------- Physical Exam Details Patient Name: [name], [name]. Date of Service: 12/26/2020 9:15 AM Medical Record Number: [id number] Patient Account Number: [id number] Date of Birth/Sex: 1946-10-16 (75 y.o. M) Treating RN: Dolan Amen Primary Care Provider: Fulton Reek Other Clinician: Jeanine Luz Referring Provider: Fulton Reek Treating Provider/Extender: Skipper Cliche in Treatment: 13 Constitutional Obese and well-hydrated in no acute distress. Respiratory normal breathing without difficulty. Psychiatric this patient is able to make decisions and demonstrates good insight into disease process. Alert and Oriented x 3. pleasant and cooperative. Notes Patient's wound bed showed signs of good granulation and epithelization and in fact I did not even see any evidence of opening today. I did have some callus that I removed from the base of the toe and on the heel I was  able to gently clear away some of the callus/thickened skin to make sure there was nothing underneath but it did  appear to be completely clear. Overall I am extremely pleased in this regard and I think the patient is safe to get in the pool in my opinion although I probably still will recommend the next care waterproof bandage to cover the heel when he does get in the pool. Electronic Signature(s) Signed: 12/26/2020 10:05:47 AM By: Worthy Keeler PA-C Entered By: Worthy Keeler on 12/26/2020 10:05:47 [name] ([id number]) -------------------------------------------------------------------------------- Physician Orders Details Patient Name: [name], [name]. Date of Service: 12/26/2020 9:15 AM Medical Record Number: [id number] Patient Account Number: [id number] Date of Birth/Sex: [birth year] (75 y.o. 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 o Discharge from [place] Treatment Complete - keep new skin protected Electronic Signature(s) Signed: 12/26/2020 11:11:52 AM By: Worthy Keeler PA-C Signed: 12/26/2020 4:13:11 PM By: Georges Mouse, Minus Breeding RN Entered By: Georges Mouse, Minus Breeding on 12/26/2020 10:03:51 [name] ([id number]) -------------------------------------------------------------------------------- Problem List Details Patient Name: [name], [name]. Date of Service: 12/26/2020 9:15 AM Medical Record Number: [id number] Patient Account Number: [id number] Date of Birth/Sex: [birth year] (75 y.o. 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, stage 3 09/24/2020 No Yes L97.812 Non-pressure chronic ulcer of other part of right lower leg with fat layer 10/08/2020 No Yes exposed Z95.4 Presence of other heart-valve replacement 09/24/2020 No Yes Z79.01 Long term (current) use of anticoagulants 09/24/2020 No Yes L84 Corns and callosities 12/26/2020 No Yes Inactive Problems Resolved Problems Electronic Signature(s) Signed: 12/26/2020 10:07:30 AM By: Worthy Keeler PA-C Previous Signature: 12/26/2020 9:13:23 AM Version By: Worthy Keeler PA-C Entered By: Worthy Keeler on 12/26/2020 10:07:30 [name] ([id number]) -------------------------------------------------------------------------------- Progress Note Details Patient Name: [name]. Date of Service: 12/26/2020 9:15 AM Medical Record Number: [id number] Patient Account Number: [id number] Date of Birth/Sex: [birth year] (75 y.o. M) Treating RN: Dolan Amen Primary Care Provider: Fulton Reek Other Clinician: Jeanine Luz Referring Provider: Fulton Reek Treating Provider/Extender: Skipper Cliche in Treatment: 13 Subjective Chief Complaint Information obtained from Patient Left heel pressure ulcer and Right LE traumatic ulcers History of Present Illness (HPI) 09/24/2020 on evaluation today patient presents today for a heel ulcer that he tells me has been present for about 2 years. He has been seeing podiatry and they have been attempting to manage this including what sounds to be a total contact cast, Unna boot, and just standard dressings otherwise as well. Most recently has been using triple  antibiotic ointment. With that being said unfortunately despite everything he really has not had any significant improvement. He tells me that he cannot even really remember exactly how this began but he presumed it may have rubbed on his shoes or  something of that nature. 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 He tells me that this does seem to help. Fortunately there is no signs of active infection at this time. His most recent hemoglobin A1c was 8.1 though he knows this was this year he cannot tell me the exact time. His fluid pills currently to help with some of the lower extremity edema although he does obviously have signs of venous stasis/lymphedema. Currently there is no evidence of active infection. No fevers, chills, nausea, vomiting, or diarrhea. Patient has had fairly recent ABIs which were performed on 07/19/2020 and revealed that he has normal findings in both the ankle and toe locations bilaterally. His ABI on the right was 1.09 on the left was 1.08 with a TBI on the right of 0.88 and on the left of 0.94. Triphasic flow was noted throughout. 10/08/2020 on evaluation today patient appears to be doing pretty well in regard to his left heel currently in fact this is doing a great job and seems to be healing quite nicely. Unfortunately on his right leg he had a pile of wood that actually fell on him injuring his right leg this is somewhat erythematous has me concerned little bit about cellulitis though there is not really a good area to culture at this point. 10/24/2020 upon evaluation today patient appears to be doing well with regard to his heel ulcer. He is showing signs of improvement which is great news. His  right leg is completely healed. Overall I feel like he is doing excellent and there is no signs of infection. 11/07/2020 upon evaluation today patient appears to be doing well with regard to his heel ulcer. He tells me that last week when he was unable to come in his wife actually thought that the wound was very close to closing if not closed. Then it began to "reopen again". I really feel like what may have happened as the collagen may have dried over the wound bed and that because that misunderstanding with thinking that the wound was healing. With that being said I did not see it last week I do not know that for certain. Either way I feel like he is doing great today I see no signs of infection at this point. 11/26/2020 upon inspection today patient appears to be doing decently well in regard to his heel ulcer. He has been tolerating the dressing changes without complication. Fortunately there is no sign of active infection at this time. No fevers, chills, nausea, vomiting, or diarrhea. 12/10/2020 upon evaluation today patient appears to be doing fairly well in regard to the wound on his heel as well as what appears to be a new wound of the left first metatarsal head plantar aspect. This seems to be an area that was callus that has split as the patient tells me has been trying to walk on his toes more has probably where this came from. With that being said there does not appear to be signs of active infection which is great news. 12/17/2020 upon evaluation today patient actually appears to be making good progress currently. Fortunately there is no evidence of active infection at this time. Overall I feel like he is very close  to complete closure. 12/26/2020 upon evaluation today patient appears to be doing excellent in regard to his wounds. In fact I am not certain that these are not even completely healed on initial inspection. Overall I am very pleased with where things stand today. Good news is that  they are healed he is actually get ready to go out of town and that will be helpful as well as he will be a full part of the time. Objective Constitutional Obese and well-hydrated in no acute distress. Vitals Time Taken: 9:26 AM, Height: 70 in, Weight: 262 lbs, BMI: 37.6, Temperature: 98.0 F, Pulse: 72 bpm, Respiratory Rate: 18 breaths/min, Blood Pressure: 144/83 mmHg. Respiratory [name], [name] ([id number]) normal breathing without difficulty. Psychiatric this patient is able to make decisions and demonstrates good insight into disease process. Alert and Oriented x 3. pleasant and cooperative. General Notes: Patient's wound bed showed signs of good granulation and epithelization and in fact I did not even see any evidence of opening today. I did have some callus that I removed from the base of the toe and on the heel I was able to gently clear away some of the callus/thickened skin to make sure there was nothing underneath but it did appear to be completely clear. Overall I am extremely pleased in this regard and I think the patient is safe to get in the pool in my opinion although I probably still will recommend the next care waterproof bandage to cover the heel when he does get in the pool. Integumentary (Hair, Skin) Wound #1 status is Healed - Epithelialized. Original cause of wound was Gradually Appeared. The date acquired was: 09/24/1998. The wound has been in treatment 13 weeks. The wound is located on the Left Calcaneus. The wound measures 0cm length x 0cm width x 0cm depth; 0cm^2 area and 0cm^3 volume. There is no tunneling or undermining noted. There is a none present amount of drainage noted. The wound margin is flat and intact. There is no granulation within the wound bed. There is no necrotic tissue within the wound bed. Wound #3 status is Healed - Epithelialized. Original cause of wound was Gradually Appeared. The date acquired was: 12/08/2020. The wound has been in treatment  2 weeks. The wound is located on the Left Metatarsal head first. The wound measures 0cm length x 0cm width x 0cm depth; 0cm^2 area and 0cm^3 volume. There is no tunneling or undermining noted. There is a none present amount of drainage noted. There is no granulation within the wound bed. There is no necrotic tissue within the wound bed. 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 by Tommie Sams., PA-C. Post procedure Diagnosis Wound #: Same as Pre-Procedure Notes: base of Left foot Plan Discharge From [facility]: Discharge from [facility] Treatment Complete - keep new skin protected 1. Would recommend currently that we go ahead and discontinue wound care services as the patient appears to be completely healed and is doing excellent. 2. I would recommend the next care waterproof bandage for the patient to use when he is in the pool. He does not have to wear this normally but just to put over the heel to ensure that this does not get too wet  at this point to make sure nothing reopens or causes him any trouble here. 3. I am also can recommend the patient continue to monitor for any signs of infection if anything occurs that he is really concerned about he should let me know soon as possible. With that being said I am hopeful that will not be an issue. We will see him back for follow-up visit as needed. Electronic Signature(s) Signed: 12/26/2020 10:07:47 AM By: Worthy Keeler PA-C Previous Signature: 12/26/2020 10:06:50 AM Version By: Derry Lory ([id number]) Previous Signature: 12/26/2020 10:06:37 AM Version By: Worthy Keeler PA-C Entered By:  Worthy Keeler on 12/26/2020 10:07:47 [name], [name] ([id number]) -------------------------------------------------------------------------------- SuperBill Details Patient Name: [name], [name]. Date of Service: 12/26/2020 Medical Record Number: [id number] Patient Account Number: [id number] Date of Birth/Sex: 01-Dec-1945 (75 y.o. 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 Z95.4 Presence of other heart-valve replacement Z79.01 Long term (current) use of anticoagulants L84 Corns and callosities Facility Procedures CPT4 Code: [id number] Description: 77034 - PARE BENIGN LES; SGL Modifier: Quantity: 1 CPT4 Code: Description: ICD-10 Diagnosis Description L84 Corns and callosities Modifier: Quantity: Physician Procedures CPT4 Code: 0352481 Description: 85909 - WC PHYS PARE BENIGN LES; SGL Modifier: Quantity: 1 CPT4 Code: Description: ICD-10 Diagnosis Description L84 Corns and callosities Modifier: Quantity: Electronic Signature(s) Signed: 12/26/2020 10:08:02 AM By: Worthy Keeler PA-C Entered By: Worthy Keeler on 12/26/2020 10:08:01

## 2021-02-04 ENCOUNTER — Other Ambulatory Visit: Payer: Self-pay

## 2021-02-04 ENCOUNTER — Other Ambulatory Visit
Admission: RE | Admit: 2021-02-04 | Discharge: 2021-02-04 | Disposition: A | Discharge status: Home / Self Care | Payer: Medicare HMO | Source: Ambulatory Visit | Attending: Physician Assistant | Admitting: Physician Assistant

## 2021-02-04 ENCOUNTER — Encounter: Payer: Medicare HMO | Attending: Physician Assistant | Admitting: Physician Assistant

## 2021-02-04 DIAGNOSIS — Z7901 Long term (current) use of anticoagulants: Secondary | ICD-10-CM | POA: Diagnosis not present

## 2021-02-04 DIAGNOSIS — L89623 Pressure ulcer of left heel, stage 3: Secondary | ICD-10-CM | POA: Insufficient documentation

## 2021-02-04 DIAGNOSIS — L84 Corns and callosities: Secondary | ICD-10-CM | POA: Insufficient documentation

## 2021-02-04 DIAGNOSIS — Z954 Presence of other heart-valve replacement: Secondary | ICD-10-CM | POA: Diagnosis not present

## 2021-02-04 DIAGNOSIS — E1140 Type 2 diabetes mellitus with diabetic neuropathy, unspecified: Secondary | ICD-10-CM | POA: Diagnosis not present

## 2021-02-04 DIAGNOSIS — L97812 Non-pressure chronic ulcer of other part of right lower leg with fat layer exposed: Secondary | ICD-10-CM | POA: Diagnosis not present

## 2021-02-04 DIAGNOSIS — E11621 Type 2 diabetes mellitus with foot ulcer: Secondary | ICD-10-CM | POA: Insufficient documentation

## 2021-02-04 DIAGNOSIS — Z79899 Other long term (current) drug therapy: Secondary | ICD-10-CM | POA: Diagnosis not present

## 2021-02-04 DIAGNOSIS — L03115 Cellulitis of right lower limb: Secondary | ICD-10-CM | POA: Diagnosis present

## 2021-02-04 NOTE — Progress Notes (Addendum)
[name], [name] ([id number]) Visit Report for 02/04/2021 Chief Complaint Document Details Patient Name: [name], TOSH. Date of Service: 02/04/2021 2:30 PM Medical Record Number: [id number] Patient Account Number: [id number] Date of Birth/Sex: December 22, 1945 (75 y.o. M) Treating RN: Dolan Amen Primary Care Provider: Fulton Reek Other Clinician: Jeanine Luz Referring Provider: Fulton Reek Treating Provider/Extender: Skipper Cliche in Treatment: 19 Information Obtained from: Patient Chief Complaint Right LE ulcers and cellulitis Electronic Signature(s) Signed: 02/04/2021 3:37:10 PM By: Worthy Keeler PA-C Previous Signature: 02/04/2021 2:25:50 PM Version By: Worthy Keeler PA-C Entered By: Worthy Keeler on 02/04/2021 15:37:10 [name] ([id number]) -------------------------------------------------------------------------------- HPI Details Patient Name: [name], [name]. Date of Service: 02/04/2021 2:30 PM Medical Record Number: [id number] Patient Account Number: [id number] Date of Birth/Sex: August 13, 1946 (75 y.o. M) Treating RN: Dolan Amen Primary Care Provider: Fulton Reek Other Clinician: Jeanine Luz Referring Provider: Fulton Reek Treating Provider/Extender: Skipper Cliche in Treatment: 19 History of Present Illness HPI Description: 09/24/2020 on evaluation today patient presents today for a heel ulcer that he tells me has been present for about 2 years. He has been seeing podiatry and they have been attempting to manage this including what sounds to be a total contact cast, Unna boot, and just standard dressings otherwise as well. Most recently has been using triple antibiotic ointment. With that being said unfortunately despite everything he really has not had any significant improvement. He tells me that he cannot even really remember exactly how this began but he presumed it may have rubbed on his shoes or something of that nature.  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 He tells me that this does seem to help. Fortunately there is no signs of active infection at this time. His most recent hemoglobin A1c was 8.1 though he knows this was this year he cannot tell me the exact time. His fluid pills currently to help with some of the lower extremity edema although he does obviously have signs of venous stasis/lymphedema. Currently there is no evidence of active infection. No fevers, chills, nausea, vomiting, or diarrhea. Patient has had fairly recent ABIs which were performed on 07/19/2020 and revealed that he has normal findings in both the ankle and toe locations bilaterally. His ABI on the right was 1.09 on the left was 1.08 with a TBI on the right of 0.88 and on the left of 0.94. Triphasic flow was noted throughout. 10/08/2020 on evaluation today patient appears to be doing pretty well in regard to his left heel currently in fact this is doing a great job and seems to be healing quite nicely. Unfortunately on his right leg he had a pile of wood that actually fell on him injuring his right leg this is somewhat erythematous has me concerned little bit about cellulitis though there is not really a good area to culture at this point. 10/24/2020 upon evaluation today patient appears to be doing well with regard to his heel ulcer. He is showing signs of improvement which is great news. His right leg is completely healed. Overall I feel like he is doing excellent and there is no signs of infection. 11/07/2020 upon evaluation today patient appears to be doing well with regard  to his heel ulcer. He tells me that last week when he was unable to come in his wife  actually thought that the wound was very close to closing if not closed. Then it began to "reopen again". I really feel like what may have happened as the collagen may have dried over the wound bed and that because that misunderstanding with thinking that the wound was healing. With that being said I did not see it last week I do not know that for certain. Either way I feel like he is doing great today I see no signs of infection at this point. 11/26/2020 upon inspection today patient appears to be doing decently well in regard to his heel ulcer. He has been tolerating the dressing changes without complication. Fortunately there is no sign of active infection at this time. No fevers, chills, nausea, vomiting, or diarrhea. 12/10/2020 upon evaluation today patient appears to be doing fairly well in regard to the wound on his heel as well as what appears to be a new wound of the left first metatarsal head plantar aspect. This seems to be an area that was callus that has split as the patient tells me has been trying to walk on his toes more has probably where this came from. With that being said there does not appear to be signs of active infection which is great news. 12/17/2020 upon evaluation today patient actually appears to be making good progress currently. Fortunately there is no evidence of active infection at this time. Overall I feel like he is very close to complete closure. 12/26/2020 upon evaluation today patient appears to be doing excellent in regard to his wounds. In fact I am not certain that these are not even completely healed on initial inspection. Overall I am very pleased with where things stand today. Good news is that they are healed he is actually get ready to go out of town and that will be helpful as well as he will be a full part of the time. Readmission: 02/04/2021 upon evaluation today patient appears to be doing well at this point in regard to his left heel that I previously saw  him for. Unfortunately he is having issues with his right lower extremity. He has significant wounds at this point he also has erythema noted there is definite signs of cellulitis which is unfortunate. With that being said I think we do need to address this sooner rather than later. The good news is he did have a nice trip to the beach. He tells me that he had no issues during that time. Electronic Signature(s) Signed: 02/04/2021 5:54:35 PM By: Worthy Keeler PA-C Entered By: Worthy Keeler on 02/04/2021 17:54:35 [name] ([id number]) -------------------------------------------------------------------------------- Physical Exam Details Patient Name: [name], [name] Date of Service: 02/04/2021 2:30 PM Medical Record Number: [id number] Patient Account Number: [id number] Date of Birth/Sex: 1946-08-21 (75 y.o. M) Treating RN: Dolan Amen Primary Care Provider: Fulton Reek Other Clinician: Jeanine Luz Referring Provider: Fulton Reek Treating Provider/Extender: Skipper Cliche in Treatment: 18 Constitutional Well-nourished and well-hydrated in no acute distress. Respiratory normal breathing without difficulty. Psychiatric this patient is able to make decisions and demonstrates good insight into disease process. Alert and Oriented x 3. pleasant and cooperative. Notes Upon inspection patient's wounds on the right leg appear to be showing signs of definite blistering and open wounds. Subsequently this is also led to cellulitis there is definite redness and inflammation noted over the leg as well. Fortunately there  does not appear to be signs of active infection which is great news. Electronic Signature(s) Signed: 02/04/2021 5:54:56 PM By: Worthy Keeler PA-C Entered By: Worthy Keeler on 02/04/2021 17:54:56 [name] ([id number]) -------------------------------------------------------------------------------- Physician Orders Details Patient Name:  [name], MIRSKY. Date of Service: 02/04/2021 2:30 PM Medical Record Number: [id number] Patient Account Number: [id number] Date of Birth/Sex: 16-Oct-1946 (75 y.o. 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 o Nurse Visit as needed - Nurse visit Thursday/Friday Bathing/ Shower/ Hygiene o May shower with wound dressing protected with water repellent cover or cast protector. Additional Orders / Instructions o Other: - Monitor for any fevers, chills, nausea, vomiting, or diarrhea and if any of this occurs he should go to the ER ASAP. Also please alert your family that if there is any altered mental status such as confusion or disorientation that you are experiencing that would also be a reason they need to get you to the hospital ASAP for potential IV antibiotics secondary to the concern of sepsis. Medications-Please add to medication list. o P.O. Antibiotics - start bactrim Wound Treatment Wound #4 - Lower Leg Wound Laterality: Right, Proximal Cleanser: Soap and Water 2 x Per Week/15 Days Discharge Instructions: Gently cleanse wound with antibacterial soap, rinse and pat dry prior to dressing wounds Primary Dressing: SILVERCEL Antimicrobial Alginate Dressing, 1x12 (in/in) 2 x Per Week/15 Days Secondary Dressing: Xtrasorb Large 6x9 (in/in) 2 x Per Week/15 Days Discharge Instructions: Apply to  wound as directed. Do not cut. Compression Wrap: Profore Lite LF 3 Multilayer Compression Bandaging System 2 x Per Week/15 Days Discharge Instructions: Apply 3 multi-layer wrap as prescribed. Wound #5 - Lower Leg Wound Laterality: Right, Midline, Distal Cleanser: Soap and Water 2 x Per Week/15 Days Discharge Instructions: Gently cleanse wound with antibacterial soap, rinse and pat dry prior to dressing wounds Primary Dressing: SILVERCEL Antimicrobial Alginate Dressing, 1x12 (in/in) 2 x Per Week/15 Days Secondary Dressing: Xtrasorb Large 6x9 (in/in) 2 x Per Week/15 Days Discharge Instructions: Apply to wound as directed. Do not cut. Compression Wrap: Profore Lite LF 3 Multilayer Compression Bandaging System 2 x Per Week/15 Days Discharge Instructions: Apply 3 multi-layer wrap as prescribed. Laboratory MAKIAH, [name] ([id number]) o Bacteria identified in Wound by Culture (MICRO) - Right leg oooo LOINC Code: 3151-7 oooo Convenience Name: Wound culture routine Patient Medications Allergies: tetracycline Notifications Medication Indication Start End Bactrim DS 02/04/2021 DOSE 1 - oral 800 mg-160 mg tablet - 1 tablet oral taken 2 times per day for 14 days. Do not taking potassium while on this medication and alert coumadin clinic to let them know Electronic Signature(s) Signed: 02/04/2021 3:41:21 PM By: Worthy Keeler PA-C Previous Signature: 02/04/2021 3:35:47 PM Version By: Worthy Keeler PA-C Entered By: Worthy Keeler on 02/04/2021 15:41:21 [name], [name] ([id number]) -------------------------------------------------------------------------------- Problem List Details Patient Name: [name], VI. Date of Service: 02/04/2021 2:30 PM Medical Record Number: [id number] Patient Account Number: [id number] Date of Birth/Sex: 15-Oct-1946 (75 y.o. M) Treating RN: Dolan Amen  Primary Care Provider: Fulton Reek Other Clinician: Jeanine Luz Referring Provider: Fulton Reek Treating Provider/Extender: Skipper Cliche in Treatment: 19 Active Problems ICD-10 Encounter Code Description Active Date MDM Diagnosis L03.115 Cellulitis of right lower limb 02/04/2021 No Yes L97.812 Non-pressure chronic ulcer of other part of right lower leg with fat layer 10/08/2020 No Yes exposed E11.622 Type 2 diabetes mellitus with other skin ulcer 02/04/2021 No Yes Z95.4 Presence of other heart-valve replacement 09/24/2020 No Yes Z79.01 Long term (current) use of anticoagulants 09/24/2020 No Yes L84 Corns and callosities 12/26/2020 No Yes Inactive Problems Resolved Problems ICD-10 Code Description Active Date Resolved Date E11.621 Type 2 diabetes mellitus with foot ulcer 09/24/2020 09/24/2020 X83.382 Pressure ulcer of left heel, stage 3 09/24/2020 09/24/2020 Electronic Signature(s) Signed: 02/04/2021 3:36:48 PM By: Worthy Keeler PA-C Previous Signature: 02/04/2021 2:25:43 PM Version By: Worthy Keeler PA-C Entered By: Worthy Keeler on 02/04/2021 15:36:48 [name] ([id number]) -------------------------------------------------------------------------------- Progress Note Details Patient Name: [name]. Date of Service: 02/04/2021 2:30 PM Medical Record Number: [id number] Patient Account Number: [id number] Date of Birth/Sex: Jan 30, 1946 (75 y.o. M) Treating RN: Dolan Amen Primary Care Provider: Fulton Reek Other Clinician: Jeanine Luz Referring Provider: Fulton Reek Treating Provider/Extender: Skipper Cliche in Treatment: 19 Subjective Chief Complaint Information obtained from Patient Right LE ulcers and cellulitis History of Present Illness (HPI) 09/24/2020 on evaluation today patient presents today for a heel ulcer that he tells me has been present for about 2 years. He has been seeing podiatry and they have been attempting to manage this including what sounds to be a total contact cast, Unna boot, and just standard  dressings otherwise as well. Most recently has been using triple antibiotic ointment. With that being said unfortunately despite everything he really has not had any significant improvement. He tells me that he cannot even really remember exactly how this began but he presumed it may have rubbed on his shoes or something of that nature. 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 He tells me that this does seem to help. Fortunately there is no signs of active infection at this time. His most recent hemoglobin A1c was 8.1 though he knows this was this year he cannot tell me the exact time. His fluid pills currently to help with some of the lower extremity edema although he does obviously have signs of venous stasis/lymphedema. Currently there is no evidence of active infection. No fevers, chills, nausea, vomiting, or diarrhea. Patient has had fairly recent ABIs which were performed on 07/19/2020 and revealed that he has normal findings in both the ankle and toe locations bilaterally. His ABI on the right was 1.09 on the left was 1.08 with a TBI on the right of 0.88 and on the left of 0.94. Triphasic flow was noted throughout. 10/08/2020 on evaluation today patient appears to be doing pretty well in regard to his left heel currently in fact this is doing a great job and seems to be healing quite nicely. Unfortunately on his right leg he had a pile of wood that actually fell on him injuring his right leg this is somewhat erythematous has me concerned little bit about cellulitis though there is not really a  good area to culture at this point. 10/24/2020 upon evaluation today patient appears to be doing well with regard to his heel  ulcer. He is showing signs of improvement which is great news. His right leg is completely healed. Overall I feel like he is doing excellent and there is no signs of infection. 11/07/2020 upon evaluation today patient appears to be doing well with regard to his heel ulcer. He tells me that last week when he was unable to come in his wife actually thought that the wound was very close to closing if not closed. Then it began to "reopen again". I really feel like what may have happened as the collagen may have dried over the wound bed and that because that misunderstanding with thinking that the wound was healing. With that being said I did not see it last week I do not know that for certain. Either way I feel like he is doing great today I see no signs of infection at this point. 11/26/2020 upon inspection today patient appears to be doing decently well in regard to his heel ulcer. He has been tolerating the dressing changes without complication. Fortunately there is no sign of active infection at this time. No fevers, chills, nausea, vomiting, or diarrhea. 12/10/2020 upon evaluation today patient appears to be doing fairly well in regard to the wound on his heel as well as what appears to be a new wound of the left first metatarsal head plantar aspect. This seems to be an area that was callus that has split as the patient tells me has been trying to walk on his toes more has probably where this came from. With that being said there does not appear to be signs of active infection which is great news. 12/17/2020 upon evaluation today patient actually appears to be making good progress currently. Fortunately there is no evidence of active infection at this time. Overall I feel like he is very close to complete closure. 12/26/2020 upon evaluation today patient appears to be doing excellent in regard to his wounds. In fact I am not certain that these are not even completely healed on initial inspection. Overall I  am very pleased with where things stand today. Good news is that they are healed he is actually get ready to go out of town and that will be helpful as well as he will be a full part of the time. Readmission: 02/04/2021 upon evaluation today patient appears to be doing well at this point in regard to his left heel that I previously saw him for. Unfortunately he is having issues with his right lower extremity. He has significant wounds at this point he also has erythema noted there is definite signs of cellulitis which is unfortunate. With that being said I think we do need to address this sooner rather than later. The good news is he did have a nice trip to the beach. He tells me that he had no issues during that time. Objective MAICOL, BOWLAND ([id number]) Constitutional Well-nourished and well-hydrated in no acute distress. Vitals Time Taken: 2:20 PM, Height: 70 in, Weight: 262 lbs, BMI: 37.6, Temperature: 98.2 F, Pulse: 75 bpm, Respiratory Rate: 18 breaths/min, Blood Pressure: 134/69 mmHg. Respiratory normal breathing without difficulty. Psychiatric this patient is able to make decisions and demonstrates good insight into disease process. Alert and Oriented x 3. pleasant and cooperative. General Notes: Upon inspection patient's wounds on the right leg appear to be showing signs of definite blistering and open wounds.  Subsequently this is also led to cellulitis there is definite redness and inflammation noted over the leg as well. Fortunately there does not appear to be signs of active infection which is great news. Integumentary (Hair, Skin) Wound #4 status is Open. Original cause of wound was Blister. The date acquired was: 02/02/2021. The wound is located on the Right,Proximal Lower Leg. The wound measures 8.4cm length x 7.5cm width x 0.1cm depth; 49.48cm^2 area and 4.948cm^3 volume. There is Fat Layer (Subcutaneous Tissue) exposed. There is no tunneling or undermining noted. There is a  medium amount of serosanguineous drainage noted. There is large (67-100%) red granulation within the wound bed. There is a small (1-33%) amount of necrotic tissue within the wound bed including Adherent Slough. Wound #5 status is Open. Original cause of wound was Blister. The date acquired was: 02/02/2021. The wound is located on the Right,Distal,Midline Lower Leg. The wound measures 2.2cm length x 5.2cm width x 0.1cm depth; 8.985cm^2 area and 0.898cm^3 volume. There is Fat Layer (Subcutaneous Tissue) exposed. There is no tunneling or undermining noted. There is a large amount of serosanguineous drainage noted. There is large (67-100%) red granulation within the wound bed. There is a small (1-33%) amount of necrotic tissue within the wound bed including Adherent Slough. 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 There was a Three Layer Compression Therapy Procedure by Dolan Amen, RN. Post procedure Diagnosis Wound #4: Same as Pre-Procedure Notes: R. ABI 1.12. Wound #5 Pre-procedure diagnosis of Wound #5 is a Diabetic Wound/Ulcer of the Lower Extremity located on the Right,Distal,Midline Lower Leg . There was a Three Layer Compression Therapy Procedure by Dolan Amen, RN. Post procedure Diagnosis Wound #5: Same as Pre-Procedure Notes: R. ABI 1.12. Plan Follow-up Appointments: Return Appointment in 1 week. Nurse Visit as needed - Nurse visit Thursday/Friday ZADOK, HOLAWAY ([id number]) Bathing/ Shower/ Hygiene: May shower with wound dressing protected with water repellent cover  or cast protector. Additional Orders / Instructions: Other: - Monitor for any fevers, chills, nausea, vomiting, or diarrhea and if any of this occurs he should go to the ER ASAP. Also please alert your family that if there is any altered mental status such as confusion or disorientation that you are experiencing that would also be a reason they need to get you to the hospital ASAP for potential IV antibiotics secondary to the concern of sepsis. Medications-Please add to medication list.: P.O. Antibiotics - start bactrim Laboratory ordered were: Wound culture routine - Right leg The following medication(s) was prescribed: Bactrim DS oral 800 mg-160 mg tablet 1 1 tablet oral taken 2 times per day for 14 days. Do not taking potassium while on this medication and alert coumadin clinic to let them know starting 02/04/2021 WOUND #4: - Lower Leg Wound Laterality: Right, Proximal Cleanser: Soap and Water 2 x Per Week/15 Days Discharge Instructions: Gently cleanse wound with antibacterial soap, rinse and pat dry prior to dressing wounds Primary Dressing: SILVERCEL Antimicrobial Alginate Dressing, 1x12 (in/in) 2 x Per Week/15 Days Secondary Dressing: Xtrasorb Large 6x9 (in/in) 2 x Per Week/15 Days Discharge Instructions: Apply to wound as directed. Do not cut. Compression Wrap: Profore  Lite LF 3 Multilayer Compression Bandaging System 2 x Per Week/15 Days Discharge Instructions: Apply 3 multi-layer wrap as prescribed. WOUND #5: - Lower Leg Wound Laterality: Right, Midline, Distal Cleanser: Soap and Water 2 x Per Week/15 Days Discharge Instructions: Gently cleanse wound with antibacterial soap, rinse and pat dry prior to dressing wounds Primary Dressing: SILVERCEL Antimicrobial Alginate Dressing, 1x12 (in/in) 2 x Per Week/15 Days Secondary Dressing: Xtrasorb Large 6x9 (in/in) 2 x Per Week/15 Days Discharge Instructions: Apply to wound as directed. Do not cut. Compression Wrap: Profore Lite LF 3  Multilayer Compression Bandaging System 2 x Per Week/15 Days Discharge Instructions: Apply 3 multi-layer wrap as prescribed. 1. Would recommend currently that we going continue with the wound care measures as before and the patient is in agreement with the plan that includes the use of silver cell I think Well-formed in the past and I think you will do well here as well. 2. I am also can recommend that we need to go ahead and initiate a compression wrap. Would recommend a 3 layer compression wrap though I think we will need to change that out towards the end of the week and nurse visit I will probably step in just a plan to make sure nothing is worsening in that regard. 3. I am also can recommend a prescription for Bactrim DS he should talk to his cardiologist about the Coumadin [name] no need to monitor this as that can affect things and he is also can need to stop his potassium while he is on the medication again all this may affect his Coumadin [name]. 4. I am also can recommend at this time that we have the patient continue with the elevation of his leg is much as possible. 5. If he develops any fevers, chills, nausea, vomiting, diarrhea, or altered mental status he is to go to the ER ASAP that was told to the patient verbally he voiced understanding also given a print out with this highlighted as well as discharge instructions. We will see patient back for reevaluation in 1 week here in the clinic. If anything worsens or changes patient will contact our office for additional recommendations. Electronic Signature(s) Signed: 02/04/2021 5:56:24 PM By: Worthy Keeler PA-C Entered By: Worthy Keeler on 02/04/2021 17:56:24 CLEO, VILLAMIZAR ([id number]) -------------------------------------------------------------------------------- SuperBill Details Patient Name: SAYAN, ALDAVA. Date of Service: 02/04/2021 Medical Record Number: [id number] Patient Account Number: [id number] Date of  Birth/Sex: [birth year] (75 y.o. 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.01 Long term (current) use of anticoagulants L84 Corns and callosities Facility Procedures CPT4 Code: 38101751 Description: (Facility Use Only) [phone] - APPLY MULTLAY COMPRS LWR RT LEG Modifier: Quantity: 1 Physician Procedures CPT4 Code: 7824235 Description: 99214 - WC PHYS LEVEL 4 - EST PT Modifier: Quantity: 1 CPT4 Code: Description: ICD-10 Diagnosis Description 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

## 2021-02-06 ENCOUNTER — Other Ambulatory Visit: Payer: Self-pay

## 2021-02-06 DIAGNOSIS — E11621 Type 2 diabetes mellitus with foot ulcer: Secondary | ICD-10-CM | POA: Diagnosis not present

## 2021-02-07 LAB — AEROBIC CULTURE W GRAM STAIN (SUPERFICIAL SPECIMEN)

## 2021-02-07 NOTE — Progress Notes (Signed)
AKIRA, ADELSBERGER ([id number]) Visit Report for 02/06/2021 Arrival Information Details Patient Name: [name], [name]. Date of Service: 02/06/2021 11:30 AM Medical Record Number: [id number] Patient Account Number: [id number] Date of Birth/Sex: April 11, 1946 (75 y.o. M) Treating RN: Donnamarie Poag Primary Care Joleene Burnham: Fulton Reek Other Clinician: Jeanine Luz Referring Mykenzie Ebanks: Fulton Reek Treating Tashunda Vandezande/Extender: Skipper Cliche in Treatment: 66 Visit Information History Since Last Visit Added or deleted any medications: No Patient Arrived: [name] Had a fall or experienced change in No Arrival Time: 11:33 activities of daily living that may affect Accompanied By: self risk of falls: Transfer Assistance: None Hospitalized since last visit: No Patient Identification Verified: Yes Has Dressing in Place as Prescribed: Yes Secondary Verification Process Completed: Yes Has Compression in Place as Prescribed: Yes Patient Requires Transmission-Based No Pain Present Now: No Precautions: Patient Has Alerts: Yes Patient Alerts: Type II Diabetic 81 mg Aspirin Warfarin ABI R1.09 TBI.88 07/19/20 ABI L1.12 TBI .94 07/19/20 Notes Currently taking the prescribed Bactrim day 2 Electronic Signature(s) Signed: 02/06/2021 2:36:51 PM By: Donnamarie Poag Previous Signature: 02/06/2021 2:33:38 PM Version By: Donnamarie Poag Entered By: Donnamarie Poag on 02/06/2021 14:36:51 [name] ([id number]) -------------------------------------------------------------------------------- Clinic Level of Care Assessment Details Patient Name: [name], [name]. Date of Service: 02/06/2021 11:30 AM Medical Record Number: [id number] Patient Account Number: [id number] Date of Birth/Sex: 1946/08/21 (75 y.o. M) Treating RN: Donnamarie Poag Primary Care Tenesha Garza: Fulton Reek Other Clinician: Jeanine Luz Referring Wyatt Thorstenson: Fulton Reek Treating Hanan Mcwilliams/Extender: Skipper Cliche in Treatment:  19 Clinic Level of Care Assessment Items TOOL 1 Quantity Score []  - Use when EandM and Procedure is performed on INITIAL visit 0 ASSESSMENTS - Nursing Assessment / Reassessment []  - General Physical Exam (combine w/ comprehensive assessment (listed just below) when performed on new 0 pt. evals) []  - 0 Comprehensive Assessment (HX, ROS, Risk Assessments, Wounds Hx, etc.) ASSESSMENTS - Wound and Skin Assessment / Reassessment []  - Dermatologic / Skin Assessment (not related to wound area) 0 ASSESSMENTS - Ostomy and/or Continence Assessment and Care []  - Incontinence Assessment and Management 0 []  - 0 Ostomy Care Assessment and Management (repouching, etc.) PROCESS - Coordination of Care []  - Simple Patient / Family Education for ongoing care 0 []  - 0 Complex (extensive) Patient / Family Education for ongoing care []  - 0 Staff obtains Programmer, systems, Records, Test Results / Process Orders []  - 0 Staff telephones HHA, Nursing Homes / Clarify orders / etc []  - 0 Routine Transfer to another Facility (non-emergent condition) []  - 0 Routine Hospital Admission (non-emergent condition) []  - 0 New Admissions / Biomedical engineer / Ordering NPWT, Apligraf, etc. []  - 0 Emergency Hospital Admission (emergent condition) PROCESS - Special Needs []  - Pediatric / Minor Patient Management 0 []  - 0 Isolation Patient Management []  - 0 Hearing / Language / Visual special needs []  - 0 Assessment of Community assistance (transportation, D/C planning, etc.) []  - 0 Additional assistance / Altered mentation []  - 0 Support Surface(s) Assessment (bed, cushion, seat, etc.) INTERVENTIONS - Miscellaneous []  - External ear exam 0 []  - 0 Patient Transfer (multiple staff / Civil Service fast streamer / Similar devices) []  - 0 Simple Staple / Suture removal (25 or less) []  - 0 Complex Staple / Suture removal (26 or more) []  - 0 Hypo/Hyperglycemic Management (do not check if billed separately) []  - 0 Ankle /  Brachial Index (ABI) - do not check if billed separately Has the patient been seen at the hospital within the last three years: Yes Total Score:  0 Level Of Care: ____ [name] ([id number]) Electronic Signature(s) Signed: 02/06/2021 2:33:38 PM By: Donnamarie Poag Entered By: Donnamarie Poag on 02/06/2021 11:52:16 [name] ([id number]) -------------------------------------------------------------------------------- Compression Therapy Details Patient Name: [name], [name]. Date of Service: 02/06/2021 11:30 AM Medical Record Number: [id number] Patient Account Number: [id number] Date of Birth/Sex: 1946/07/30 (75 y.o. M) Treating RN: Donnamarie Poag Primary Care Gerard Bonus: Fulton Reek Other Clinician: Jeanine Luz Referring Avaley Coop: Fulton Reek Treating Yaa Donnellan/Extender: Skipper Cliche in Treatment: 19 Compression Therapy Performed for Wound Assessment: Wound #5 Right,Distal,Midline Lower Leg Performed By: Junius Argyle, RN Compression Type: Three Layer Electronic Signature(s) Signed: 02/06/2021 2:33:38 PM By: Donnamarie Poag Entered By: Donnamarie Poag on 02/06/2021 11:52:57 [name] ([id number]) -------------------------------------------------------------------------------- Encounter Discharge Information Details Patient Name: [name], [name]. Date of Service: 02/06/2021 11:30 AM Medical Record Number: [id number] Patient Account Number: [id number] Date of Birth/Sex: June 29, 1946 (75 y.o. M) Treating RN: Donnamarie Poag Primary Care Icelyn Navarrete: Fulton Reek Other Clinician: Jeanine Luz Referring Jacquline Terrill: Fulton Reek Treating Tylor Gambrill/Extender: Skipper Cliche in Treatment: 19 Encounter Discharge Information Items Discharge Condition: Stable Ambulatory Status: Cane Discharge Destination: Home Transportation: Private Auto Accompanied By: self Schedule Follow-up Appointment: Yes Clinical Summary of Care: Electronic Signature(s) Signed:  02/06/2021 2:33:38 PM By: Donnamarie Poag Entered By: Donnamarie Poag on 02/06/2021 11:52:03 [name] ([id number]) -------------------------------------------------------------------------------- Wound Assessment Details Patient Name: [name], [name]. Date of Service: 02/06/2021 11:30 AM Medical Record Number: [id number] Patient Account Number: [id number] Date of Birth/Sex: 30-Mar-1946 (75 y.o. M) Treating RN: Donnamarie Poag Primary Care Patricia Perales: Fulton Reek Other Clinician: Jeanine Luz Referring Amiere Cawley: Fulton Reek Treating Wilhelmena Zea/Extender: Skipper Cliche in Treatment: 19 Wound Status Wound Number: 4 Primary Etiology: Diabetic Wound/Ulcer of the Lower Extremity Wound Location: Right, Proximal Lower Leg Wound Status: Open Wounding Event: Blister Date Acquired: 02/02/2021 Weeks Of Treatment: 0 Clustered Wound: Yes Wound Measurements Length: (cm) 8.4 Width: (cm) 7.5 Depth: (cm) 0.1 Area: (cm) 49.48 Volume: (cm) 4.948 % Reduction in Area: 0% % Reduction in Volume: 0% Wound Description Classification: Grade 1 Treatment Notes Wound #4 (Lower Leg) Wound Laterality: Right, Proximal Cleanser Soap and Water Discharge Instruction: Gently cleanse wound with antibacterial soap, rinse and pat dry prior to dressing wounds Peri-Wound Care Topical Primary Dressing SILVERCEL Antimicrobial Alginate Dressing, 1x12 (in/in) Secondary Dressing Xtrasorb Large 6x9 (in/in) Discharge Instruction: Apply to wound as directed. Do not cut. Secured With Compression Wrap Profore Lite LF 3 Multilayer Compression Bandaging System Discharge Instruction: Apply 3 multi-layer wrap as prescribed. Compression Stockings Add-Ons Electronic Signature(s) Signed: 02/06/2021 2:33:38 PM By: Donnamarie Poag Entered By: Donnamarie Poag on 02/06/2021 11:35:51 [name] ([id number]) -------------------------------------------------------------------------------- Wound Assessment Details Patient  Name: [name], [name]. Date of Service: 02/06/2021 11:30 AM Medical Record Number: [id number] Patient Account Number: [id number] Date of Birth/Sex: 30-Sep-1946 (75 y.o. M) Treating RN: Donnamarie Poag Primary Care Yanis Juma: Fulton Reek Other Clinician: Jeanine Luz Referring Carden Teel: Fulton Reek Treating Kourtni Stineman/Extender: Skipper Cliche in Treatment: 19 Wound Status Wound Number: 5 Primary Etiology: Diabetic Wound/Ulcer of the Lower Extremity Wound Location: Right, Distal, Midline Lower Leg Wound Status: Open Wounding Event: Blister Date Acquired: 02/02/2021 Weeks Of Treatment: 0 Clustered Wound: No Wound Measurements Length: (cm) 2.2 Width: (cm) 5.2 Depth: (cm) 0.1 Area: (cm) 8.985 Volume: (cm) 0.898 % Reduction in Area: 0% % Reduction in Volume: 0% Wound Description Classification: Grade 1 Treatment Notes Wound #5 (Lower Leg) Wound Laterality: Right, Midline, Distal Cleanser Soap and Water Discharge Instruction: Gently cleanse wound with antibacterial soap, rinse and  pat dry prior to dressing wounds Peri-Wound Care Topical Primary Dressing SILVERCEL Antimicrobial Alginate Dressing, 1x12 (in/in) Secondary Dressing Xtrasorb Large 6x9 (in/in) Discharge Instruction: Apply to wound as directed. Do not cut. Secured With Compression Wrap Profore Lite LF 3 Multilayer Compression Bandaging System Discharge Instruction: Apply 3 multi-layer wrap as prescribed. Compression Stockings Add-Ons Electronic Signature(s) Signed: 02/06/2021 2:33:38 PM By: Donnamarie Poag Entered By: Donnamarie Poag on 02/06/2021 11:35:51

## 2021-02-12 ENCOUNTER — Encounter: Payer: Medicare HMO | Admitting: Internal Medicine

## 2021-02-12 ENCOUNTER — Other Ambulatory Visit: Payer: Self-pay

## 2021-02-12 DIAGNOSIS — E11621 Type 2 diabetes mellitus with foot ulcer: Secondary | ICD-10-CM | POA: Diagnosis not present

## 2021-02-12 NOTE — Progress Notes (Signed)
[name], [name] ([id number]) Visit Report for 02/04/2021 Arrival Information Details Patient Name: [name], [name]. Date of Service: 02/04/2021 2:30 PM Medical Record Number: [id number] Patient Account Number: [id number] Date of Birth/Sex: [birth year] (75 y.o. M) Treating RN: Donnamarie Poag Primary Care Sumeya Yontz: Fulton Reek Other Clinician: Jeanine Luz Referring Corretta Munce: Fulton Reek Treating Wynette Jersey/Extender: Skipper Cliche in Treatment: 72 Visit Information History Since Last Visit Added or deleted any medications: No Patient Arrived: Ambulatory Had a fall or experienced change in No Arrival Time: 14:14 activities of daily living that may affect Accompanied By: self risk of falls: Transfer Assistance: None Hospitalized since last visit: No Patient Identification Verified: Yes Has Dressing in Place as Prescribed: Yes Secondary Verification Process Completed: Yes Pain Present Now: Yes Patient Requires Transmission-Based No Precautions: Patient Has Alerts: Yes Patient Alerts: Type II Diabetic 81 mg Aspirin Warfarin ABI R1.09 TBI.88 07/19/20 ABI L1.12 TBI .94 07/19/20 Electronic Signature(s) Signed: 02/12/2021 3:51:10 PM By: Carlene Coria RN Entered By: Carlene Coria on 02/04/2021 14:49:19 [name] ([id number]) -------------------------------------------------------------------------------- Clinic Level of Care Assessment Details Patient Name: [name]. Date of Service: 02/04/2021 2:30 PM Medical Record Number: [id number] Patient Account Number: [id number] Date of Birth/Sex: 17-Nov-1945 (75 y.o. M) Treating RN: Dolan Amen Primary Care Shulamit Donofrio: Fulton Reek Other Clinician: Jeanine Luz Referring Amahri Dengel: Fulton Reek Treating Quantae Martel/Extender: Skipper Cliche in Treatment: 19 Clinic Level of Care Assessment Items TOOL 2 Quantity Score []  - Use when only an EandM is performed on the INITIAL visit 0 ASSESSMENTS - Nursing  Assessment / Reassessment []  - General Physical Exam (combine w/ comprehensive assessment (listed just below) when performed on new 0 pt. evals) []  - 0 Comprehensive Assessment (HX, ROS, Risk Assessments, Wounds Hx, etc.) ASSESSMENTS - Wound and Skin Assessment / Reassessment []  - Simple Wound Assessment / Reassessment - one wound 0 []  - 0 Complex Wound Assessment / Reassessment - multiple wounds []  - 0 Dermatologic / Skin Assessment (not related to wound area) ASSESSMENTS - Ostomy and/or Continence Assessment and Care []  - Incontinence Assessment and Management 0 []  - 0 Ostomy Care Assessment and Management (repouching, etc.) PROCESS - Coordination of Care []  - Simple Patient / Family Education for ongoing care 0 []  - 0 Complex (extensive) Patient / Family Education for ongoing care []  - 0 Staff obtains Programmer, systems, Records, Test Results / Process Orders []  - 0 Staff telephones HHA, Nursing Homes / Clarify orders / etc []  - 0 Routine Transfer to another Facility (non-emergent condition) []  - 0 Routine Hospital Admission (non-emergent condition) []  - 0 New Admissions / Biomedical engineer / Ordering NPWT, Apligraf, etc. []  - 0 Emergency Hospital Admission (emergent condition) []  - 0 Simple Discharge Coordination []  - 0 Complex (extensive) Discharge Coordination PROCESS - Special Needs []  - Pediatric / Minor Patient Management 0 []  - 0 Isolation Patient Management []  - 0 Hearing / Language / Visual special needs []  - 0 Assessment of Community assistance (transportation, D/C planning, etc.) []  - 0 Additional assistance / Altered mentation []  - 0 Support Surface(s) Assessment (bed, cushion, seat, etc.) INTERVENTIONS - Wound Cleansing / Measurement []  - Wound Imaging (photographs - any number of wounds) 0 []  - 0 Wound Tracing (instead of photographs) []  - 0 Simple Wound Measurement - one wound []  - 0 Complex Wound Measurement - multiple wounds [name].  ([id number]) []  - 0 Simple Wound Cleansing - one wound []  - 0 Complex Wound Cleansing - multiple wounds INTERVENTIONS - Wound Dressings []  - Small Wound  Dressing one or multiple wounds 0 []  - 0 Medium Wound Dressing one or multiple wounds []  - 0 Large Wound Dressing one or multiple wounds []  - 0 Application of Medications - injection INTERVENTIONS - Miscellaneous []  - External ear exam 0 []  - 0 Specimen Collection (cultures, biopsies, blood, body fluids, etc.) []  - 0 Specimen(s) / Culture(s) sent or taken to Lab for analysis []  - 0 Patient Transfer (multiple staff / [name] / Similar devices) []  - 0 Simple Staple / Suture removal (25 or less) []  - 0 Complex Staple / Suture removal (26 or more) []  - 0 Hypo / Hyperglycemic Management (close monitor of Blood Glucose) []  - 0 Ankle / Brachial Index (ABI) - do not check if billed separately Has the patient been seen at the hospital within the last three years: Yes Total Score: 0 Level Of Care: ____ Electronic Signature(s) Signed: 02/04/2021 5:05:23 PM By: Georges Mouse, Minus Breeding RN Entered By: Georges Mouse, Minus Breeding on 02/04/2021 15:32:21 [name] ([id number]) -------------------------------------------------------------------------------- Compression Therapy Details Patient Name: [name]. Date of Service: 02/04/2021 2:30 PM Medical Record Number: [id number] Patient Account Number: [id number] Date of Birth/Sex: [birth year] (75 y.o. M) Treating RN: Dolan Amen Primary Care Trevan Messman: Fulton Reek Other Clinician: Jeanine Luz Referring Nolan Tuazon: Fulton Reek Treating Giana Castner/Extender: Skipper Cliche in Treatment: 19 Compression Therapy Performed for Wound Assessment: Wound #4 Right,Proximal Lower Leg Performed By: Cora Daniels, RN Compression Type: Three Layer Post Procedure Diagnosis Same as Pre-procedure Notes R. ABI 1.12 Electronic Signature(s) Signed: 02/04/2021 5:05:23 PM  By: Georges Mouse, Minus Breeding RN Entered By: Georges Mouse, Minus Breeding on 02/04/2021 15:27:43 [name] ([id number]) -------------------------------------------------------------------------------- Compression Therapy Details Patient Name: DAMARCUS, REGGIO. Date of Service: 02/04/2021 2:30 PM Medical Record Number: [id number] Patient Account Number: [id number] Date of Birth/Sex: October 08, 1946 (75 y.o. M) Treating RN: Dolan Amen Primary Care Danasia Baker: Fulton Reek Other Clinician: Jeanine Luz Referring Mandy Peeks: Fulton Reek Treating Emmalyne Giacomo/Extender: Skipper Cliche in Treatment: 19 Compression Therapy Performed for Wound Assessment: Wound #5 Right,Distal,Midline Lower Leg Performed By: Cora Daniels, RN Compression Type: Three Layer Post Procedure Diagnosis Same as Pre-procedure Notes R. ABI 1.12 Electronic Signature(s) Signed: 02/04/2021 5:05:23 PM By: Georges Mouse, Minus Breeding RN Entered By: Georges Mouse, Minus Breeding on 02/04/2021 15:27:43 [name] ([id number]) -------------------------------------------------------------------------------- Encounter Discharge Information Details Patient Name: PAULANTHONY, GLEAVES. Date of Service: 02/04/2021 2:30 PM Medical Record Number: [id number] Patient Account Number: [id number] Date of Birth/Sex: 1946/08/07 (75 y.o. M) Treating RN: Donnamarie Poag Primary Care Berthe Oley: Fulton Reek Other Clinician: Jeanine Luz Referring Cabria Micalizzi: Fulton Reek Treating Kashmir Leedy/Extender: Skipper Cliche in Treatment: 19 Encounter Discharge Information Items Discharge Condition: Stable Ambulatory Status: Cane Discharge Destination: Home Transportation: Private Auto Accompanied By: self Schedule Follow-up Appointment: Yes Clinical Summary of Care: Electronic Signature(s) Signed: 02/04/2021 4:18:00 PM By: Donnamarie Poag Entered By: Donnamarie Poag on 02/04/2021 15:47:37 [name]  ([id number]) -------------------------------------------------------------------------------- Lower Extremity Assessment Details Patient Name: HASHEM, GOYNES. Date of Service: 02/04/2021 2:30 PM Medical Record Number: [id number] Patient Account Number: [id number] Date of Birth/Sex: 1946-04-27 (75 y.o. M) Treating RN: Donnamarie Poag Primary Care Juletta Berhe: Fulton Reek Other Clinician: Jeanine Luz Referring Koleman Marling: Fulton Reek Treating Darrik Richman/Extender: Skipper Cliche in Treatment: 19 Edema Assessment Assessed: [Left: No] Patrice Paradise: Yes] Edema: [Left: Ye] [Right: s] Calf Left: Right: Point of Measurement: 34 cm From Medial Instep 44.8 cm Ankle Left: Right: Point of Measurement: 12 cm From Medial Instep 25.4 cm Knee To Floor Left: Right: From Medial Instep 42 cm  Vascular Assessment Pulses: Dorsalis Pedis Palpable: [Right:Yes] Electronic Signature(s) Signed: 02/04/2021 4:18:00 PM By: Donnamarie Poag Entered By: Donnamarie Poag on 02/04/2021 14:39:40 [name] ([id number]) -------------------------------------------------------------------------------- Multi Wound Chart Details Patient Name: KUZEY, OGATA. Date of Service: 02/04/2021 2:30 PM Medical Record Number: [id number] Patient Account Number: [id number] Date of Birth/Sex: [birth year] (75 y.o. M) Treating RN: Dolan Amen Primary Care Mary-Ann Pennella: Fulton Reek Other Clinician: Jeanine Luz Referring Loretta Kluender: Fulton Reek Treating Mccade Sullenberger/Extender: Skipper Cliche in Treatment: 19 Vital Signs Height(in): 70 Pulse(bpm): 53 Weight(lbs): 15 Blood Pressure(mmHg): 134/69 Body Mass Index(BMI): 38 Temperature(F): 98.2 Respiratory Rate(breaths/min): 18 Photos: [N/A:N/A] Wound Location: Lower Leg Right, Distal, Midline Lower Leg N/A Wounding Event: Blister Blister N/A Primary Etiology: Diabetic Wound/Ulcer of the Lower Diabetic Wound/Ulcer of the Lower N/A Extremity Extremity Comorbid History:  Cataracts, Arrhythmia, Coronary Cataracts, Arrhythmia, Coronary N/A Artery Disease, Hypertension, Type Artery Disease, Hypertension, Type II Diabetes, Osteoarthritis, II Diabetes, Osteoarthritis, Neuropathy Neuropathy Date Acquired: 02/02/2021 02/02/2021 N/A Weeks of Treatment: 0 0 N/A Wound Status: Open Open N/A Clustered Wound: Yes No N/A Measurements L x W x D (cm) 8.4x7.5x0.1 2.2x5.2x0.1 N/A Area (cm) : 49.48 8.985 N/A Volume (cm) : 4.948 0.898 N/A Classification: Grade 1 Grade 1 N/A Exudate Amount: Medium Large N/A Exudate Type: Serosanguineous Serosanguineous N/A Exudate Color: red, brown red, brown N/A Granulation Amount: Large (67-100%) Large (67-100%) N/A Granulation Quality: Red Red N/A Necrotic Amount: Small (1-33%) Small (1-33%) N/A Exposed Structures: Fat Layer (Subcutaneous Tissue): Fat Layer (Subcutaneous Tissue): N/A Yes Yes Fascia: No Fascia: No Tendon: No Tendon: No Muscle: No Muscle: No Joint: No Joint: No Bone: No Bone: No Treatment Notes Electronic Signature(s) Signed: 02/04/2021 5:05:23 PM By: Georges Mouse, Minus Breeding RN Entered By: Georges Mouse, Minus Breeding on 02/04/2021 15:27:01 [name] ([id number]) -------------------------------------------------------------------------------- [place] Details Patient Name: SURYA, SCHROETER. Date of Service: 02/04/2021 2:30 PM Medical Record Number: [id number] Patient Account Number: [id number] Date of Birth/Sex: 1946/08/14 (75 y.o. M) Treating RN: Dolan Amen Primary Care Avanelle Pixley: Fulton Reek Other Clinician: Jeanine Luz Referring Bryann Mcnealy: Fulton Reek Treating Trellis Guirguis/Extender: Skipper Cliche in Treatment: 19 Active Inactive Electronic Signature(s) Signed: 02/04/2021 5:05:23 PM By: Georges Mouse, Minus Breeding RN Entered By: Georges Mouse, Minus Breeding on 02/04/2021 15:26:49 [name]  ([id number]) -------------------------------------------------------------------------------- Pain Assessment Details Patient Name: AUTHOR, HATLESTAD. Date of Service: 02/04/2021 2:30 PM Medical Record Number: [id number] Patient Account Number: [id number] Date of Birth/Sex: 1946/10/06 (75 y.o. M) Treating RN: Donnamarie Poag Primary Care Anas Reister: Fulton Reek Other Clinician: Jeanine Luz Referring Mariana Goytia: Fulton Reek Treating Elsye Mccollister/Extender: Skipper Cliche in Treatment: 19 Active Problems Location of Pain Severity and Description of Pain Patient Has Paino Yes Site Locations Pain Location: Pain in Ulcers Rate the pain. Current Pain Level: 6 Pain Management and Medication Current Pain Management: Electronic Signature(s) Signed: 02/04/2021 4:18:00 PM By: Donnamarie Poag Entered By: Donnamarie Poag on 02/04/2021 14:22:40 [name] ([id number]) -------------------------------------------------------------------------------- Patient/Caregiver Education Details Patient Name: HUTSON, LUFT. Date of Service: 02/04/2021 2:30 PM Medical Record Number: [id number] Patient Account Number: [id number] Date of Birth/Gender: 11/24/1945 (75 y.o. M) Treating RN: Dolan Amen Primary Care Physician: Fulton Reek Other Clinician: Jeanine Luz Referring Physician: Fulton Reek Treating Physician/Extender: Skipper Cliche in Treatment: 14 Education Assessment Education Provided To: Patient Education Topics Provided Wound/Skin Impairment: Methods: Explain/Verbal Responses: State content correctly Electronic Signature(s) Signed: 02/04/2021 5:05:23 PM By: Georges Mouse, Minus Breeding RN Entered By: Georges Mouse, Minus Breeding on 02/04/2021 15:32:41 [name] ([id number]) -------------------------------------------------------------------------------- Wound Assessment Details Patient Name: MAYA, SCHOLER.  Date of Service: 02/04/2021 2:30 PM Medical Record Number:  [id number] Patient Account Number: [id number] Date of Birth/Sex: 02-18-1946 (75 y.o. M) Treating RN: Donnamarie Poag Primary Care Talyn Dessert: Fulton Reek Other Clinician: Jeanine Luz Referring Donn Zanetti: Fulton Reek Treating Deron Poole/Extender: Skipper Cliche in Treatment: 19 Wound Status Wound Number: 4 Primary Diabetic Wound/Ulcer of the Lower Extremity Etiology: Wound Location: Lower Leg Wound Open Wounding Event: Blister Status: Date Acquired: 02/02/2021 Comorbid Cataracts, Arrhythmia, Coronary Artery Disease, Weeks Of Treatment: 0 History: Hypertension, Type II Diabetes, Osteoarthritis, Clustered Wound: No Neuropathy Photos Wound Measurements Length: (cm) 8.4 Width: (cm) 7.5 Depth: (cm) 0.1 Area: (cm) 49.48 Volume: (cm) 4.948 % Reduction in Area: % Reduction in Volume: Tunneling: No Undermining: No Wound Description Classification: Grade 1 Exudate Amount: Medium Exudate Type: Serosanguineous Exudate Color: red, brown Foul Odor After Cleansing: No Slough/Fibrino Yes Wound Bed Granulation Amount: Large (67-100%) Exposed Structure Granulation Quality: Red Fascia Exposed: No Necrotic Amount: Small (1-33%) Fat Layer (Subcutaneous Tissue) Exposed: Yes Necrotic Quality: Adherent Slough Tendon Exposed: No Muscle Exposed: No Joint Exposed: No Bone Exposed: No Electronic Signature(s) Signed: 02/04/2021 4:18:00 PM By: Donnamarie Poag Entered By: Donnamarie Poag on 02/04/2021 14:35:00 [name] ([id number]) -------------------------------------------------------------------------------- Wound Assessment Details Patient Name: [name]. Date of Service: 02/04/2021 2:30 PM Medical Record Number: [id number] Patient Account Number: [id number] Date of Birth/Sex: 1946-01-27 (75 y.o. M) Treating RN: Donnamarie Poag Primary Care Leigh Kaeding: Fulton Reek Other Clinician: Jeanine Luz Referring Charlea Nardo: Fulton Reek Treating Maxamus Colao/Extender: Skipper Cliche in Treatment: 19 Wound Status Wound Number: 5 Primary Diabetic Wound/Ulcer of the Lower Extremity Etiology: Wound Location: Right, Distal, Midline Lower Leg Wound Open Wounding Event: Blister Status: Date Acquired: 02/02/2021 Comorbid Cataracts, Arrhythmia, Coronary Artery Disease, Weeks Of Treatment: 0 History: Hypertension, Type II Diabetes, Osteoarthritis, Clustered Wound: No Neuropathy Photos Wound Measurements Length: (cm) 2.2 Width: (cm) 5.2 Depth: (cm) 0.1 Area: (cm) 8.985 Volume: (cm) 0.898 % Reduction in Area: % Reduction in Volume: Tunneling: No Undermining: No Wound Description Classification: Grade 1 Exudate Amount: Large Exudate Type: Serosanguineous Exudate Color: red, brown Foul Odor After Cleansing: No Slough/Fibrino Yes Wound Bed Granulation Amount: Large (67-100%) Exposed Structure Granulation Quality: Red Fascia Exposed: No Necrotic Amount: Small (1-33%) Fat Layer (Subcutaneous Tissue) Exposed: Yes Necrotic Quality: Adherent Slough Tendon Exposed: No Muscle Exposed: No Joint Exposed: No Bone Exposed: No Electronic Signature(s) Signed: 02/04/2021 4:18:00 PM By: Donnamarie Poag Entered By: Donnamarie Poag on 02/04/2021 14:36:46 [name] ([id number]) -------------------------------------------------------------------------------- [place] Details Patient Name: [name]. Date of Service: 02/04/2021 2:30 PM Medical Record Number: [id number] Patient Account Number: [id number] Date of Birth/Sex: [birth year] (75 y.o. M) Treating RN: Donnamarie Poag Primary Care Elizabella Nolet: Fulton Reek Other Clinician: Jeanine Luz Referring Sala Tague: Fulton Reek Treating Thressa Shiffer/Extender: Skipper Cliche in Treatment: 19 Vital Signs Time Taken: 14:20 Temperature (F): 98.2 Height (in): 70 Pulse (bpm): 75 Weight (lbs): 262 Respiratory Rate (breaths/min): 18 Body Mass Index (BMI): 37.6 Blood Pressure (mmHg): 134/69 Reference Range:  80 - 120 mg / dl Electronic Signature(s) Signed: 02/04/2021 4:18:00 PM By: Donnamarie Poag Entered ByDonnamarie Poag on 02/04/2021 14:22:29

## 2021-02-12 NOTE — Progress Notes (Signed)
RUSH, SALCE ([id number]) Visit Report for 02/12/2021 Debridement Details Patient Name: [name], [name]. Date of Service: 02/12/2021 1:00 PM Medical Record Number: [id number] Patient Account Number: [id number] Date of Birth/Sex: [birth year] (75 y.o. M) Treating RN: [name] Primary Care Provider: Fulton Reek Other Clinician: Jeanine Luz Referring Provider: Fulton Reek Treating Provider/Extender: Tito Dine in Treatment: 20 Debridement Performed for Wound #5 Right,Distal,Midline Lower Leg Assessment: Performed By: Physician Ricard Dillon, MD Debridement Type: Debridement Severity of Tissue Pre Debridement: Fat layer exposed Level of Consciousness (Pre- Awake and Alert procedure): Pre-procedure Verification/Time Out Yes - 13:20 Taken: Total Area Debrided (L x W): 2 (cm) x 5.2 (cm) = 10.4 (cm) Tissue and other material Viable, Non-Viable, Slough, Subcutaneous, Slough debrided: Level: Skin/Subcutaneous Tissue Debridement Description: Excisional Instrument: Curette Bleeding: Moderate Hemostasis Achieved: Pressure Response to Treatment: Procedure was tolerated well Level of Consciousness (Post- Awake and Alert procedure): Post Debridement Measurements of Total Wound Length: (cm) 2 Width: (cm) 5.2 Depth: (cm) 0.2 Volume: (cm) 1.634 Character of Wound/Ulcer Post Debridement: Stable Severity of Tissue Post Debridement: Fat layer exposed Post Procedure Diagnosis Same as Pre-procedure Electronic Signature(s) Signed: 02/12/2021 4:08:37 PM By: [name], BSN, RN, CWS, [name] RN, BSN Signed: 02/12/2021 5:04:28 PM By: Linton Ham MD Entered By: Linton Ham on 02/12/2021 13:27:51 [name] ([id number]) -------------------------------------------------------------------------------- HPI Details Patient Name: [name], [name]. Date of Service: 02/12/2021 1:00 PM Medical Record Number: [id number] Patient Account Number: [id number] Date of  Birth/Sex: 1945/11/12 (75 y.o. M) Treating RN: [name] Primary Care Provider: Fulton Reek Other Clinician: Jeanine Luz Referring Provider: Fulton Reek Treating Provider/Extender: Tito Dine in Treatment: 20 History of Present Illness HPI Description: 09/24/2020 on evaluation today patient presents today for a heel ulcer that he tells me has been present for about 2 years. He has been seeing podiatry and they have been attempting to manage this including what sounds to be a total contact cast, Unna boot, and just standard dressings otherwise as well. Most recently has been using triple antibiotic ointment. With that being said unfortunately despite everything he really has not had any significant improvement. He tells me that he cannot even really remember exactly how this began but he presumed it may have rubbed on his shoes or something of that nature. 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 He tells me that this does seem to help. Fortunately there is no signs of active infection at this time. His most recent hemoglobin A1c was 8.1 though he knows this was this year he cannot tell me the exact time. His fluid pills currently to help with some of the lower extremity edema although he does obviously have signs of venous stasis/lymphedema. Currently there is no evidence of active infection. No fevers, chills, nausea, vomiting, or diarrhea. Patient has had fairly recent ABIs which were performed on 07/19/2020 and revealed that he has normal findings in both the ankle and toe locations bilaterally. His ABI on the right was 1.09 on the left was 1.08 with a TBI on the right of 0.88 and on the left  of 0.94. Triphasic flow was noted throughout. 10/08/2020 on evaluation today patient appears to be doing pretty well in regard to his  left heel currently in fact this is doing a great job and seems to be healing quite nicely. Unfortunately on his right leg he had a pile of wood that actually fell on him injuring his right leg this is somewhat erythematous has me concerned little bit about cellulitis though there is not really a good area to culture at this point. 10/24/2020 upon evaluation today patient appears to be doing well with regard to his heel ulcer. He is showing signs of improvement which is great news. His right leg is completely healed. Overall I feel like he is doing excellent and there is no signs of infection. 11/07/2020 upon evaluation today patient appears to be doing well with regard to his heel ulcer. He tells me that last week when he was unable to come in his wife actually thought that the wound was very close to closing if not closed. Then it began to "reopen again". I really feel like what may have happened as the collagen may have dried over the wound bed and that because that misunderstanding with thinking that the wound was healing. With that being said I did not see it last week I do not know that for certain. Either way I feel like he is doing great today I see no signs of infection at this point. 11/26/2020 upon inspection today patient appears to be doing decently well in regard to his heel ulcer. He has been tolerating the dressing changes without complication. Fortunately there is no sign of active infection at this time. No fevers, chills, nausea, vomiting, or diarrhea. 12/10/2020 upon evaluation today patient appears to be doing fairly well in regard to the wound on his heel as well as what appears to be a new wound of the left first metatarsal head plantar aspect. This seems to be an area that was callus that has split as the patient tells me has been trying to walk on his  toes more has probably where this came from. With that being said there does not appear to be signs of active infection which is great news. 12/17/2020 upon evaluation today patient actually appears to be making good progress currently. Fortunately there is no evidence of active infection at this time. Overall I feel like he is very close to complete closure. 12/26/2020 upon evaluation today patient appears to be doing excellent in regard to his wounds. In fact I am not certain that these are not even completely healed on initial inspection. Overall I am very pleased with where things stand today. Good news is that they are healed he is actually get ready to go out of town and that will be helpful as well as he will be a full part of the time. Readmission: 02/04/2021 upon evaluation today patient appears to be doing well at this point in regard to his left heel that I previously saw him for. Unfortunately he is having issues with his right lower extremity. He has significant wounds at this point he also has erythema noted there is definite signs of cellulitis which is unfortunate. With that being said I think we do need to address this sooner rather than later. The good news is he did have a nice trip to the beach. He tells me that he had no issues during that time. 4/27; patient on Bactrim. Culture showed methicillin sensitive staph aureus therefore the Bactrim should be effective. 2 small areas on the right leg are healed the area on the mid aspect of the tibia almost  100% covered in a very adherent necrotic debris. We have been using silver alginate Electronic Signature(s) Signed: 02/12/2021 5:04:28 PM By: Linton Ham MD Entered By: Linton Ham on 02/12/2021 13:28:42 [name] ([id number]) -------------------------------------------------------------------------------- Physical Exam Details Patient Name: [name], [name]. Date of Service: 02/12/2021 1:00 PM Medical Record Number:  [id number] Patient Account Number: [id number] Date of Birth/Sex: Feb 04, 1946 (75 y.o. M) Treating RN: [name] Primary Care Provider: Fulton Reek Other Clinician: Jeanine Luz Referring Provider: Fulton Reek Treating Provider/Extender: Tito Dine in Treatment: 15 Constitutional Patient is hypertensive.. Pulse regular and within target range for patient.Marland Kitchen Respirations regular, non-labored and within target range.. Temperature is normal and within the target range for the patient.Marland Kitchen appears in no distress. Notes Wound exam; the patient is left with 1 wound on the right mid tibia. The smaller areas superficially are healed above this. Unfortunately 100% of this is covered in a very adherent fibrinous necrotic debris. I removed this with a #5 curette. Fortunately surface looks quite good Electronic Signature(s) Signed: 02/12/2021 5:04:28 PM By: Linton Ham MD Entered By: Linton Ham on 02/12/2021 13:29:46 [name] ([id number]) -------------------------------------------------------------------------------- Physician Orders Details Patient Name: [name], [name]. Date of Service: 02/12/2021 1:00 PM Medical Record Number: [id number] Patient Account Number: [id number] Date of Birth/Sex: 1946/06/12 (74 y.o. M) Treating RN: [name] Primary Care Provider: Fulton Reek Other Clinician: Jeanine Luz Referring Provider: Fulton Reek Treating Provider/Extender: Tito Dine in Treatment: 20 Verbal / Phone Orders: No Diagnosis Coding Follow-up Appointments o Return Appointment in 1 week. o Nurse Visit as needed Bathing/ Shower/ Hygiene o May shower with wound dressing protected with water repellent cover or cast protector. Edema Control - Lymphedema / Segmental Compressive Device / Other o Optional: One layer of unna paste to top of compression wrap (to act as an anchor). o Elevate, Exercise Daily and Avoid Standing for Long  Periods of Time. o Elevate legs to the level of the heart and pump ankles as often as possible o Elevate leg(s) parallel to the floor when sitting. Additional Orders / Instructions o Follow Nutritious Diet and Increase Protein Intake o Other: - Monitor for any fevers, chills, nausea, vomiting, or diarrhea and if any of this occurs he should go to the ER ASAP. Also please alert your family that if there is any altered mental status such as confusion or disorientation that you are experiencing that would also be a reason they need to get you to the hospital ASAP for potential IV antibiotics secondary to the concern of sepsis. Medications-Please add to medication list. o P.O. Antibiotics - continue antibiotics Wound Treatment Wound #5 - Lower Leg Wound Laterality: Right, Midline, Distal Cleanser: Soap and Water 2 x Per Week/15 Days Discharge Instructions: Gently cleanse wound with antibacterial soap, rinse and pat dry prior to dressing wounds Primary Dressing: Iodosorb 40 (g) 2 x Per Week/15 Days Discharge Instructions: Apply IodoSorb to wound bed only as directed. Secondary Dressing: Xtrasorb Large 6x9 (in/in) 2 x Per Week/15 Days Discharge Instructions: Apply to wound as directed. Do not cut. Compression Wrap: Profore Lite LF 3 Multilayer Compression Bandaging System 2 x Per Week/15 Days Discharge Instructions: Apply 3 multi-layer wrap as prescribed. Electronic Signature(s) Signed: 02/12/2021 4:08:37 PM By: [name], BSN, RN, CWS, [name] RN, BSN Signed: 02/12/2021 5:04:28 PM By: Linton Ham MD Entered By: [name], BSN, RN, CWS, [name] on 02/12/2021 13:28:10 [name], [name] ([id number]) -------------------------------------------------------------------------------- Problem List Details Patient Name: [name], [name]. Date of Service: 02/12/2021 1:00 PM  Medical Record Number: [id number] Patient Account Number: [id number] Date of Birth/Sex: Feb 23, 1946 (75 y.o. M) Treating RN: [name] Primary Care Provider: Fulton Reek Other Clinician: Jeanine Luz Referring Provider: Fulton Reek Treating Provider/Extender: Tito Dine in Treatment: 20 Active Problems ICD-10 Encounter Code Description Active Date MDM Diagnosis L03.115 Cellulitis of right lower limb 02/04/2021 No Yes L97.812 Non-pressure chronic ulcer of other part of right lower leg with fat layer 10/08/2020 No Yes exposed E11.622 Type 2 diabetes mellitus with other skin ulcer 02/04/2021 No Yes Z95.4 Presence of other heart-valve replacement 09/24/2020 No Yes Z79.01 Long term (current) use of anticoagulants 09/24/2020 No Yes L84 Corns and callosities 12/26/2020 No Yes Inactive Problems Resolved Problems ICD-10 Code Description Active Date Resolved Date E11.621 Type 2 diabetes mellitus with foot ulcer 09/24/2020 09/24/2020 L89.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 Date of Service: 02/12/2021 1:00 PM Medical Record Number: [id number] Patient Account Number: [id number] Date of Birth/Sex: 06/08/1946 (75 y.o. M) Treating RN: [name] Primary Care Provider: Fulton Reek Other Clinician: Jeanine Luz Referring Provider: Fulton Reek Treating Provider/Extender: Tito Dine in Treatment: 20 Subjective History of Present Illness (HPI) 09/24/2020 on evaluation today patient presents today for a heel ulcer that he tells me has been present for about 2 years. He has been seeing podiatry and they have been attempting to manage this including what sounds to be a total contact cast, Unna boot, and just standard dressings otherwise as well. Most recently has been using  triple antibiotic ointment. With that being said unfortunately despite everything he really has not had any significant improvement. He tells me that he cannot even really remember exactly how this began but he presumed it may have rubbed on his shoes or something of that nature. 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 He tells me that this does seem to help. Fortunately there is no signs of active infection at this time. His most recent hemoglobin A1c was 8.1 though he knows this was this year he cannot tell me the exact time. His fluid pills currently to help with some of the lower extremity edema although he does obviously have signs of venous stasis/lymphedema. Currently there is no evidence of active infection. No fevers, chills, nausea, vomiting, or diarrhea. Patient has had fairly recent ABIs which were performed on 07/19/2020 and revealed that he has normal findings in both the ankle and toe locations bilaterally. His ABI on the right was 1.09 on the left was 1.08 with a TBI on the right of 0.88 and on the left of 0.94. Triphasic flow was noted throughout. 10/08/2020 on evaluation today patient appears to be doing pretty well in regard to his left heel currently in fact this is doing a great job and seems to be healing quite nicely. Unfortunately on his right leg he had a pile of wood that actually fell on him injuring his right leg this is somewhat erythematous has me concerned little bit about cellulitis though there is not really a good area to  culture at this point. 10/24/2020 upon evaluation today patient appears to be doing well with regard to his heel ulcer. He is showing signs of improvement which is  great news. His right leg is completely healed. Overall I feel like he is doing excellent and there is no signs of infection. 11/07/2020 upon evaluation today patient appears to be doing well with regard to his heel ulcer. He tells me that last week when he was unable to come in his wife actually thought that the wound was very close to closing if not closed. Then it began to "reopen again". I really feel like what may have happened as the collagen may have dried over the wound bed and that because that misunderstanding with thinking that the wound was healing. With that being said I did not see it last week I do not know that for certain. Either way I feel like he is doing great today I see no signs of infection at this point. 11/26/2020 upon inspection today patient appears to be doing decently well in regard to his heel ulcer. He has been tolerating the dressing changes without complication. Fortunately there is no sign of active infection at this time. No fevers, chills, nausea, vomiting, or diarrhea. 12/10/2020 upon evaluation today patient appears to be doing fairly well in regard to the wound on his heel as well as what appears to be a new wound of the left first metatarsal head plantar aspect. This seems to be an area that was callus that has split as the patient tells me has been trying to walk on his toes more has probably where this came from. With that being said there does not appear to be signs of active infection which is great news. 12/17/2020 upon evaluation today patient actually appears to be making good progress currently. Fortunately there is no evidence of active infection at this time. Overall I feel like he is very close to complete closure. 12/26/2020 upon evaluation today patient appears to be doing excellent in regard to his wounds. In fact I am not certain that these are not even completely healed on initial inspection. Overall I am very pleased with where things stand today.  Good news is that they are healed he is actually get ready to go out of town and that will be helpful as well as he will be a full part of the time. Readmission: 02/04/2021 upon evaluation today patient appears to be doing well at this point in regard to his left heel that I previously saw him for. Unfortunately he is having issues with his right lower extremity. He has significant wounds at this point he also has erythema noted there is definite signs of cellulitis which is unfortunate. With that being said I think we do need to address this sooner rather than later. The good news is he did have a nice trip to the beach. He tells me that he had no issues during that time. 4/27; patient on Bactrim. Culture showed methicillin sensitive staph aureus therefore the Bactrim should be effective. 2 small areas on the right leg are healed the area on the mid aspect of the tibia almost 100% covered in a very adherent necrotic debris. We have been using silver alginate Objective Constitutional Patient is hypertensive.. Pulse regular and within target range for patient.Marland Kitchen Respirations regular, non-labored and within target range.[name], [name] ([id number]) Temperature is normal and within the target range for the patient.Marland Kitchen appears in no distress. Vitals Time Taken: 12:56  PM, Height: 70 in, Weight: 262 lbs, BMI: 37.6, Temperature: 97.9 F, Pulse: 65 bpm, Respiratory Rate: 18 breaths/min, Blood Pressure: 157/78 mmHg. General Notes: Wound exam; the patient is left with 1 wound on the right mid tibia. The smaller areas superficially are healed above this. Unfortunately 100% of this is covered in a very adherent fibrinous necrotic debris. I removed this with a #5 curette. Fortunately surface looks quite good Integumentary (Hair, Skin) Wound #4 status is Healed - Epithelialized. Original cause of wound was Blister. The date acquired was: 02/02/2021. The wound has been in treatment 1 weeks. The wound is  located on the Right,Proximal Lower Leg. The wound measures 0cm length x 0cm width x 0cm depth; 0cm^2 area and 0cm^3 volume. There is no tunneling or undermining noted. There is a none present amount of drainage noted. Wound #5 status is Open. Original cause of wound was Blister. The date acquired was: 02/02/2021. The wound has been in treatment 1 weeks. The wound is located on the Right,Distal,Midline Lower Leg. The wound measures 2cm length x 5.2cm width x 0.1cm depth; 8.168cm^2 area and 0.817cm^3 volume. There is Fat Layer (Subcutaneous Tissue) exposed. There is no tunneling or undermining noted. There is a medium amount of serosanguineous drainage noted. There is small (1-33%) pink granulation within the wound bed. There is a large (67-100%) amount of necrotic tissue within the wound bed including Adherent Slough. 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 There was a Excisional Skin/Subcutaneous Tissue Debridement with a total area of 10.4 sq cm performed by Ricard Dillon, MD. With the following instrument(s): Curette to remove Viable and Non-Viable tissue/material. Material removed includes Subcutaneous Tissue and Slough and. No specimens were taken. A time out was conducted at 13:20, prior to the start of the procedure. A Moderate amount of bleeding was controlled with Pressure. The procedure was tolerated well. Post Debridement Measurements: 2cm length x 5.2cm width x 0.2cm depth; 1.634cm^3 volume. Character of Wound/Ulcer  Post Debridement is stable. Severity of Tissue Post Debridement is: Fat layer exposed. Post procedure Diagnosis Wound #5: Same as Pre-Procedure Plan Follow-up Appointments: Return Appointment in 1 week. Nurse Visit as needed Bathing/ Shower/ Hygiene: May shower with wound dressing protected with water repellent cover or cast protector. Edema Control - Lymphedema / Segmental Compressive Device / Other: Optional: One layer of unna paste to top of compression wrap (to act as an anchor). Elevate, Exercise Daily and Avoid Standing for Long Periods of Time. Elevate legs to the level of the heart and pump ankles as often as possible Elevate leg(s) parallel to the floor when sitting. Additional Orders / Instructions: Follow Nutritious Diet and Increase Protein Intake Other: - Monitor for any fevers, chills, nausea, vomiting, or diarrhea and if any of this occurs he should go to the ER ASAP. Also please alert your family that if there is any altered mental status such as confusion or disorientation that you are experiencing that would also be a reason they [name], [name] ([id number]) need to get you to the hospital ASAP for potential IV antibiotics secondary to the concern of  sepsis. Medications-Please add to medication list.: P.O. Antibiotics - continue antibiotics WOUND #5: - Lower Leg Wound Laterality: Right, Midline, Distal Cleanser: Soap and Water 2 x Per Week/15 Days Discharge Instructions: Gently cleanse wound with antibacterial soap, rinse and pat dry prior to dressing wounds Primary Dressing: Iodosorb 40 (g) 2 x Per Week/15 Days Discharge Instructions: Apply IodoSorb to wound bed only as directed. Secondary Dressing: Xtrasorb Large 6x9 (in/in) 2 x Per Week/15 Days Discharge Instructions: Apply to wound as directed. Do not cut. Compression Wrap: Profore Lite LF 3 Multilayer Compression Bandaging System 2 x Per Week/15 Days Discharge Instructions: Apply 3 multi-layer wrap as  prescribed. 1. I change the primary dressing to Iodoflex still under 3 layer compression and packed with Xtrasorb. Electronic Signature(s) Signed: 02/12/2021 5:04:28 PM By: Linton Ham MD Entered By: Linton Ham on 02/12/2021 13:30:25 [name] ([id number]) -------------------------------------------------------------------------------- SuperBill Details Patient Name: [name], [name]. Date of Service: 02/12/2021 Medical Record Number: [id number] Patient Account Number: [id number] Date of Birth/Sex: 17-Mar-1946 (75 y.o. 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 Z95.4 Presence of other heart-valve replacement Z79.01 Long term (current) use of anticoagulants L84 Corns and callosities Facility Procedures CPT4 Code: 34035248 Description: 11042 - DEB SUBQ TISSUE 20 SQ CM/< Modifier: Quantity: 1 CPT4 Code: Description: ICD-10 Diagnosis Description 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

## 2021-02-12 NOTE — Progress Notes (Signed)
[name], [name] ([id number]) Visit Report for 02/12/2021 Arrival Information Details Patient Name: [name], [name]. Date of Service: 02/12/2021 1:00 PM Medical Record Number: [id number] Patient Account Number: [id number] Date of Birth/Sex: 1946/08/08 (75 y.o. M) Treating RN: Donnamarie Poag Primary Care Luva Metzger: Fulton Reek Other Clinician: Jeanine Luz Referring Sohum Delillo: Fulton Reek Treating Antonique Langford/Extender: Tito Dine in Treatment: 20 Visit Information History Since Last Visit Added or deleted any medications: No Patient Arrived: [name] Had a fall or experienced change in No Arrival Time: 12:58 activities of daily living that may affect Accompanied By: self risk of falls: Transfer Assistance: None Hospitalized since last visit: No Patient Identification Verified: Yes Has Dressing in Place as Prescribed: Yes Secondary Verification Process Completed: Yes Pain Present Now: Yes Patient Requires Transmission-Based No Precautions: Patient Has Alerts: Yes Patient Alerts: Type II Diabetic 81 mg Aspirin Warfarin ABI R1.09 TBI.88 07/19/20 ABI L1.12 TBI .94 07/19/20 Electronic Signature(s) Signed: 02/12/2021 2:06:04 PM By: Donnamarie Poag Entered By: Donnamarie Poag on 02/12/2021 12:58:40 [name] ([id number]) -------------------------------------------------------------------------------- Encounter Discharge Information Details Patient Name: [name], [name]. Date of Service: 02/12/2021 1:00 PM Medical Record Number: [id number] Patient Account Number: [id number] Date of Birth/Sex: 1946-03-26 (75 y.o. M) Treating RN: Dolan Amen Primary Care Sanda Dejoy: Fulton Reek Other Clinician: Jeanine Luz Referring Marria Mathison: Fulton Reek Treating Kiylee Thoreson/Extender: Tito Dine in Treatment: 20 Encounter Discharge Information Items Post Procedure Vitals Discharge Condition: Stable Temperature (F): 97.9 Ambulatory Status: Cane Pulse (bpm):  65 Discharge Destination: Home Respiratory Rate (breaths/min): 18 Transportation: Private Auto Blood Pressure (mmHg): 157/78 Accompanied By: self Schedule Follow-up Appointment: Yes Clinical Summary of Care: Electronic Signature(s) Signed: 02/12/2021 3:52:05 PM By: Georges Mouse, Minus Breeding RN Entered By: Georges Mouse, Minus Breeding on 02/12/2021 13:46:16 [name] ([id number]) -------------------------------------------------------------------------------- Lower Extremity Assessment Details Patient Name: [name]. Date of Service: 02/12/2021 1:00 PM Medical Record Number: [id number] Patient Account Number: [id number] Date of Birth/Sex: 1946-03-26 (75 y.o. M) Treating RN: Donnamarie Poag Primary Care Eldra Word: Fulton Reek Other Clinician: Jeanine Luz Referring Deacon Gadbois: Fulton Reek Treating Keion Neels/Extender: Tito Dine in Treatment: 20 Edema Assessment Assessed: [Left: No] [Right: Yes] [Left: Edema] [Right: :] Calf Left: Right: Point of Measurement: 34 cm From Medial Instep 42.5 cm Ankle Left: Right: Point of Measurement: 12 cm From Medial Instep 23.5 cm Vascular Assessment Pulses: Dorsalis Pedis Palpable: [Right:Yes] Electronic Signature(s) Signed: 02/12/2021 2:06:04 PM By: Donnamarie Poag Entered By: Donnamarie Poag on 02/12/2021 13:13:18 [name] ([id number]) -------------------------------------------------------------------------------- Multi Wound Chart Details Patient Name: [name]. Date of Service: 02/12/2021 1:00 PM Medical Record Number: [id number] Patient Account Number: [id number] Date of Birth/Sex: October 09, 1946 (75 y.o. M) Treating RN: [name] Primary Care Joseff Luckman: Fulton Reek Other Clinician: Jeanine Luz Referring Clayvon Parlett: Fulton Reek Treating Alberto Pina/Extender: Tito Dine in Treatment: 20 Vital Signs Height(in): 70 Pulse(bpm): 47 Weight(lbs): 94 Blood Pressure(mmHg): 157/78 Body  Mass Index(BMI): 38 Temperature(F): 97.9 Respiratory Rate(breaths/min): 18 Photos: [N/A:N/A] Wound Location: Right, Proximal Lower Leg Right, Distal, Midline Lower Leg N/A Wounding Event: Blister Blister N/A Primary Etiology: Diabetic Wound/Ulcer of the Lower Diabetic Wound/Ulcer of the Lower N/A Extremity Extremity Comorbid History: Cataracts, Arrhythmia, Coronary Cataracts, Arrhythmia, Coronary N/A Artery Disease, Hypertension, Type Artery Disease, Hypertension, Type II Diabetes, Osteoarthritis, II Diabetes, Osteoarthritis, Neuropathy Neuropathy Date Acquired: 02/02/2021 02/02/2021 N/A Weeks of Treatment: 1 1 N/A Wound Status: Healed - Epithelialized Open N/A Clustered Wound: Yes No N/A Measurements L x W x D (cm) 0x0x0 2x5.2x0.1 N/A Area (cm) : 0 8.168 N/A Volume (  cm) : 0 0.817 N/A % Reduction in Area: 100.00% 9.10% N/A % Reduction in Volume: 100.00% 9.00% N/A Classification: Grade 1 Grade 1 N/A Exudate Amount: None Present Medium N/A Exudate Type: N/A Serosanguineous N/A Exudate Color: N/A red, brown N/A Granulation Amount: N/A Small (1-33%) N/A Granulation Quality: N/A Pink N/A Necrotic Amount: N/A Large (67-100%) N/A Epithelialization: Large (67-100%) N/A N/A Debridement: N/A Debridement - Excisional N/A Pre-procedure Verification/Time N/A 13:20 N/A Out Taken: Tissue Debrided: N/A Subcutaneous, Slough N/A Level: N/A Skin/Subcutaneous Tissue N/A Debridement Area (sq cm): N/A 10.4 N/A Instrument: N/A Curette N/A Bleeding: N/A Moderate N/A Hemostasis Achieved: N/A Pressure N/A Debridement Treatment N/A Procedure was tolerated well N/A Response: Post Debridement N/A 2x5.2x0.2 N/A Measurements L x W x D (cm) Post Debridement Volume: N/A 1.634 N/A (cm) Procedures Performed: N/A Debridement N/A [name], [name] ([id number]) Treatment Notes Electronic Signature(s) Signed: 02/12/2021 5:04:28 PM By: Linton Ham MD Entered By: Linton Ham on 02/12/2021  13:27:41 [name] ([id number]) -------------------------------------------------------------------------------- Multi-Disciplinary Care Plan Details Patient Name: [name], [name]. Date of Service: 02/12/2021 1:00 PM Medical Record Number: [id number] Patient Account Number: [id number] Date of Birth/Sex: [birth year] (75 y.o. M) Treating RN: [name] Primary Care Takeyla Million: Fulton Reek Other Clinician: Jeanine Luz Referring Meliah Appleman: Fulton Reek Treating Layne Dilauro/Extender: Tito Dine in Treatment: 20 Active Inactive Necrotic Tissue Nursing Diagnoses: Impaired tissue integrity related to necrotic/devitalized tissue Goals: Necrotic/devitalized tissue will be minimized in the wound bed Date Initiated: 02/12/2021 Target Resolution Date: 02/12/2021 Goal Status: Active Interventions: Assess patient pain level pre-, during and post procedure and prior to discharge Notes: Electronic Signature(s) Signed: 02/12/2021 4:08:37 PM By: [name], BSN, RN, CWS, [name] RN, BSN Entered By: [name], BSN, RN, CWS, [name] on 02/12/2021 13:24:30 [name] ([id number]) -------------------------------------------------------------------------------- Pain Assessment Details Patient Name: [name], [name]. Date of Service: 02/12/2021 1:00 PM Medical Record Number: [id number] Patient Account Number: [id number] Date of Birth/Sex: August 24, 1946 (75 y.o. M) Treating RN: Donnamarie Poag Primary Care Toriann Spadoni: Fulton Reek Other Clinician: Jeanine Luz Referring Horst Ostermiller: Fulton Reek Treating Shaleen Talamantez/Extender: Tito Dine in Treatment: 20 Active Problems Location of Pain Severity and Description of Pain Patient Has Paino Yes Site Locations Pain Location: Pain in Ulcers Rate the pain. Current Pain Level: 4 Pain Management and Medication Current Pain Management: Electronic Signature(s) Signed: 02/12/2021 2:06:04 PM By: Donnamarie Poag Entered By: Donnamarie Poag on  02/12/2021 13:02:11 [name] ([id number]) -------------------------------------------------------------------------------- Patient/Caregiver Education Details Patient Name: [name], [name]. Date of Service: 02/12/2021 1:00 PM Medical Record Number: [id number] Patient Account Number: [id number] Date of Birth/Gender: 06-11-1946 (75 y.o. M) Treating RN: [name] Primary Care Physician: Fulton Reek Other Clinician: Jeanine Luz Referring Physician: Fulton Reek Treating Physician/Extender: Tito Dine in Treatment: 20 Education Assessment Education Provided To: Patient Education Topics Provided Venous: Handouts: Controlling Swelling with Multilayered Compression Wraps Methods: Demonstration, Explain/Verbal Responses: State content correctly Wound Debridement: Handouts: Wound Debridement Methods: Demonstration, Explain/Verbal Responses: State content correctly Electronic Signature(s) Signed: 02/12/2021 4:08:37 PM By: [name], BSN, RN, CWS, [name] RN, BSN Entered By: [name], BSN, RN, CWS, [name] on 02/12/2021 13:28:39 [name] ([id number]) -------------------------------------------------------------------------------- Wound Assessment Details Patient Name: [name], [name]. Date of Service: 02/12/2021 1:00 PM Medical Record Number: [id number] Patient Account Number: [id number] Date of Birth/Sex: [birth year] (75 y.o. M) Treating RN: [name] Primary Care Yvonda Fouty: Fulton Reek Other Clinician: Jeanine Luz Referring Ayyan Sites: Fulton Reek Treating Shaiann Mcmanamon/Extender: Tito Dine in Treatment: 20 Wound Status Wound Number: 4 Primary Diabetic Wound/Ulcer of the  Lower Extremity Etiology: Wound Location: Right, Proximal Lower Leg Wound Healed - Epithelialized Wounding Event: Blister Status: Date Acquired: 02/02/2021 Comorbid Cataracts, Arrhythmia, Coronary Artery Disease, Weeks Of Treatment: 1 History: Hypertension, Type II  Diabetes, Osteoarthritis, Clustered Wound: Yes Neuropathy Photos Wound Measurements Length: (cm) 0 % Red Width: (cm) 0 % Red Depth: (cm) 0 Epith Area: (cm) 0 Tunn Volume: (cm) 0 Unde uction in Area: 100% uction in Volume: 100% elialization: Large (67-100%) eling: No rmining: No Wound Description Classification: Grade 1 Foul Exudate Amount: None Present Odor After Cleansing: No Treatment Notes Wound #4 (Lower Leg) Wound Laterality: Right, Proximal Cleanser Peri-Wound Care Topical Primary Dressing Secondary Dressing Secured With Compression Wrap Compression Stockings Add-Ons Electronic Signature(s) [name], [name] ([id number]) Signed: 02/12/2021 4:08:37 PM By: [name], BSN, RN, CWS, [name] RN, BSN Entered By: [name], BSN, RN, CWS, [name] on 02/12/2021 13:23:52 DAMIEON, ARMENDARIZ ([id number]) -------------------------------------------------------------------------------- Wound Assessment Details Patient Name: [name], [name]. Date of Service: 02/12/2021 1:00 PM Medical Record Number: [id number] Patient Account Number: [id number] Date of Birth/Sex: [birth year] (75 y.o. M) Treating RN: Donnamarie Poag Primary Care [place] Stra[place]: Fulton Reek Other Clinician: Jeanine Luz Referring Zakaree Mcclenahan: Fulton Reek Treating Lewis Grivas/Extender: Tito Dine in Treatment: 20 Wound Status Wound Number: 5 Primary Diabetic Wound/Ulcer of the Lower Extremity Etiology: Wound Location: Right, Distal, Midline Lower Leg Wound Open Wounding Event: Blister Status: Date Acquired: 02/02/2021 Comorbid Cataracts, Arrhythmia, Coronary Artery Disease, Weeks Of Treatment: 1 History: Hypertension, Type II Diabetes, Osteoarthritis, Clustered Wound: No Neuropathy Photos Wound Measurements Length: (cm) 2 Width: (cm) 5.2 Depth: (cm) 0.1 Area: (cm) 8.168 Volume: (cm) 0.817 % Reduction in Area: 9.1% % Reduction in Volume: 9% Tunneling: No Undermining: No Wound  Description Classification: Grade 1 Exudate Amount: Medium Exudate Type: Serosanguineous Exudate Color: red, brown Foul Odor After Cleansing: No Slough/Fibrino Yes Wound Bed Granulation Amount: Small (1-33%) Exposed Structure Granulation Quality: Pink Fascia Exposed: No Necrotic Amount: Large (67-100%) Fat Layer (Subcutaneous Tissue) Exposed: Yes Necrotic Quality: Adherent Slough Tendon Exposed: No Muscle Exposed: No Joint Exposed: No Bone Exposed: No Treatment Notes Wound #5 (Lower Leg) Wound Laterality: Right, Midline, Distal Cleanser Soap and Water Discharge Instruction: Gently cleanse wound with antibacterial soap, rinse and pat dry prior to dressing wounds Peri-Wound Care BRITTAIN, HOSIE ([id number]) Topical Primary Dressing Iodosorb 40 (g) Discharge Instruction: Apply IodoSorb to wound bed only as directed. Secondary Dressing Xtrasorb Large 6x9 (in/in) Discharge Instruction: Apply to wound as directed. Do not cut. Secured With Compression Wrap Profore Lite LF 3 Multilayer Compression Bandaging System Discharge Instruction: Apply 3 multi-layer wrap as prescribed. Compression Stockings Add-Ons Electronic Signature(s) Signed: 02/12/2021 2:06:04 PM By: Donnamarie Poag Entered By: Donnamarie Poag on 02/12/2021 13:10:39 [name] ([id number]) -------------------------------------------------------------------------------- [place] Details Patient Name: LINDELL, RENFREW. Date of Service: 02/12/2021 1:00 PM Medical Record Number: [id number] Patient Account Number: [id number] Date of Birth/Sex: 1946-03-16 (75 y.o. M) Treating RN: Donnamarie Poag Primary Care Carrah Eppolito: Fulton Reek Other Clinician: Jeanine Luz Referring Tytionna Cloyd: Fulton Reek Treating Kyera Felan/Extender: Tito Dine in Treatment: 20 Vital Signs Time Taken: 12:56 Temperature (F): 97.9 Height (in): 70 Pulse (bpm): 65 Weight (lbs): 262 Respiratory Rate (breaths/min): 18 Body Mass  Index (BMI): 37.6 Blood Pressure (mmHg): 157/78 Reference Range: 80 - 120 mg / dl Electronic Signature(s) Signed: 02/12/2021 2:06:04 PM By: Donnamarie Poag Entered ByDonnamarie Poag on 02/12/2021 13:02:01

## 2021-02-20 ENCOUNTER — Encounter: Payer: Medicare HMO | Attending: Physician Assistant | Admitting: Physician Assistant

## 2021-02-20 ENCOUNTER — Other Ambulatory Visit: Payer: Self-pay

## 2021-02-20 DIAGNOSIS — Z7901 Long term (current) use of anticoagulants: Secondary | ICD-10-CM | POA: Insufficient documentation

## 2021-02-20 DIAGNOSIS — L97819 Non-pressure chronic ulcer of other part of right lower leg with unspecified severity: Secondary | ICD-10-CM | POA: Diagnosis present

## 2021-02-20 DIAGNOSIS — L03115 Cellulitis of right lower limb: Secondary | ICD-10-CM | POA: Diagnosis not present

## 2021-02-20 DIAGNOSIS — E11622 Type 2 diabetes mellitus with other skin ulcer: Secondary | ICD-10-CM | POA: Insufficient documentation

## 2021-02-20 DIAGNOSIS — L97812 Non-pressure chronic ulcer of other part of right lower leg with fat layer exposed: Secondary | ICD-10-CM | POA: Insufficient documentation

## 2021-02-20 DIAGNOSIS — Z952 Presence of prosthetic heart valve: Secondary | ICD-10-CM | POA: Diagnosis not present

## 2021-02-20 DIAGNOSIS — L84 Corns and callosities: Secondary | ICD-10-CM | POA: Insufficient documentation

## 2021-02-20 NOTE — Progress Notes (Addendum)
THAYER, EMBLETON ([id number]) Visit Report for 02/20/2021 Chief Complaint Document Details Patient Name: [name], [name]. Date of Service: 02/20/2021 2:00 PM Medical Record Number: [id number] Patient Account Number: [id number] Date of Birth/Sex: [birth year] (75 y.o. M) Treating RN: Dolan Amen Primary Care Provider: Fulton Reek Other Clinician: Referring Provider: Fulton Reek Treating Provider/Extender: Skipper Cliche in Treatment: 21 Information Obtained from: Patient Chief Complaint Right LE ulcers and cellulitis Electronic Signature(s) Signed: 02/20/2021 2:25:40 PM By: Worthy Keeler PA-C Entered By: Worthy Keeler on 02/20/2021 14:25:40 [name] ([id number]) -------------------------------------------------------------------------------- HPI Details Patient Name: [name]. Date of Service: 02/20/2021 2:00 PM Medical Record Number: [id number] Patient Account Number: [id number] Date of Birth/Sex: 1945-11-23 (75 y.o. M) Treating RN: Dolan Amen Primary Care Provider: Fulton Reek Other Clinician: Referring Provider: Fulton Reek Treating Provider/Extender: Skipper Cliche in Treatment: 21 History of Present Illness HPI Description: 09/24/2020 on evaluation today patient presents today for a heel ulcer that he tells me has been present for about 2 years. He has been seeing podiatry and they have been attempting to manage this including what sounds to be a total contact cast, Unna boot, and just standard dressings otherwise as well. Most recently has been using triple antibiotic ointment. With that being said unfortunately despite everything he really has not had any significant improvement. He tells me that he cannot even really remember exactly how this began but he presumed it may have rubbed on his shoes or something of that nature. 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 He tells me that this does seem to help. Fortunately there is no signs of active infection at this time. His most recent hemoglobin A1c was 8.1 though he knows this was this year he cannot tell me the exact time. His fluid pills currently to help with some of the lower extremity edema although he does obviously have signs of venous stasis/lymphedema. Currently there is no evidence of active infection. No fevers, chills, nausea, vomiting, or diarrhea. Patient has had fairly recent ABIs which were performed on 07/19/2020 and revealed that he has normal findings in both the ankle and toe locations bilaterally. His ABI on the right was 1.09 on the left was 1.08 with a TBI on the right of 0.88 and on the left of 0.94. Triphasic flow was noted throughout. 10/08/2020 on evaluation today patient appears to be doing pretty well in regard to his left heel currently in fact this is doing a great job and seems to be healing quite nicely. Unfortunately on his right leg he had a pile of wood that actually fell on him injuring his right leg this is somewhat erythematous has me concerned little bit about cellulitis though there is not really a good area to culture at this point. 10/24/2020 upon evaluation today patient appears to be doing well with regard to his heel ulcer. He is showing signs of improvement which is great news. His right leg is completely healed. Overall I feel like he is doing excellent and there is no signs of infection. 11/07/2020 upon evaluation today patient appears to be doing well with regard to his heel ulcer. He tells me that last week when he was unable to  come in his wife actually thought that the wound was very close to closing if not closed. Then it began to "reopen again". I really  feel like what may have happened as the collagen may have dried over the wound bed and that because that misunderstanding with thinking that the wound was healing. With that being said I did not see it last week I do not know that for certain. Either way I feel like he is doing great today I see no signs of infection at this point. 11/26/2020 upon inspection today patient appears to be doing decently well in regard to his heel ulcer. He has been tolerating the dressing changes without complication. Fortunately there is no sign of active infection at this time. No fevers, chills, nausea, vomiting, or diarrhea. 12/10/2020 upon evaluation today patient appears to be doing fairly well in regard to the wound on his heel as well as what appears to be a new wound of the left first metatarsal head plantar aspect. This seems to be an area that was callus that has split as the patient tells me has been trying to walk on his toes more has probably where this came from. With that being said there does not appear to be signs of active infection which is great news. 12/17/2020 upon evaluation today patient actually appears to be making good progress currently. Fortunately there is no evidence of active infection at this time. Overall I feel like he is very close to complete closure. 12/26/2020 upon evaluation today patient appears to be doing excellent in regard to his wounds. In fact I am not certain that these are not even completely healed on initial inspection. Overall I am very pleased with where things stand today. Good news is that they are healed he is actually get ready to go out of town and that will be helpful as well as he will be a full part of the time. Readmission: 02/04/2021 upon evaluation today patient appears to be doing well at this point in regard to his left heel that I previously saw him for. Unfortunately he is having issues with his right lower extremity. He has significant wounds at this point  he also has erythema noted there is definite signs of cellulitis which is unfortunate. With that being said I think we do need to address this sooner rather than later. The good news is he did have a nice trip to the beach. He tells me that he had no issues during that time. 4/27; patient on Bactrim. Culture showed methicillin sensitive staph aureus therefore the Bactrim should be effective. 2 small areas on the right leg are healed the area on the mid aspect of the tibia almost 100% covered in a very adherent necrotic debris. We have been using silver alginate 02/20/2021 upon evaluation today patient appears to be doing well with regard to his wound. He is showing signs of improvement which is great news overall very pleased with where things stand today. No fevers, chills, nausea, vomiting, or diarrhea. Electronic Signature(s) Signed: 02/20/2021 2:50:35 PM By: Worthy Keeler PA-C Entered By: Worthy Keeler on 02/20/2021 14:50:35 [name] ([id number]) -------------------------------------------------------------------------------- Physical Exam Details Patient Name: GORJE, IYER Date of Service: 02/20/2021 2:00 PM Medical Record Number: [id number] Patient Account Number: [id number] Date of Birth/Sex: [name] 25, 1946 (75 y.o. M) Treating RN: Dolan Amen Primary Care Provider: Fulton Reek Other Clinician: Referring Provider: Fulton Reek Treating Provider/Extender: Skipper Cliche in Treatment: 21 Constitutional Well-nourished and well-hydrated in  no acute distress. Respiratory normal breathing without difficulty. Psychiatric this patient is able to make decisions and demonstrates good insight into disease process. Alert and Oriented x 3. pleasant and cooperative. Notes Patient's wound does not appear to show any signs of need for sharp debridement which is great news. With that being said I do think that he is tolerating the compression wrap without complication. And the  only issue is that it slid down. If that happens again in the future he knows to contact me let me know. Otherwise we can stick with the Iodoflex as needed doing well. Electronic Signature(s) Signed: 02/20/2021 2:51:01 PM By: Worthy Keeler PA-C Entered By: Worthy Keeler on 02/20/2021 14:51:01 [name] ([id number]) -------------------------------------------------------------------------------- Physician Orders Details Patient Name: ALECSANDER, HATTABAUGH. Date of Service: 02/20/2021 2:00 PM Medical Record Number: [id number] Patient Account Number: [id number] Date of Birth/Sex: Apr 04, 1946 (75 y.o. 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 o Nurse Visit as needed Bathing/ Shower/ Hygiene o May shower with wound dressing protected with water repellent cover or cast protector. Edema Control - Lymphedema / Segmental Compressive Device / Other o Optional: One layer of unna paste to top of compression wrap (to act as an anchor). o Elevate, Exercise Daily and Avoid Standing for Long Periods of Time. o Elevate legs to the level of the heart and pump ankles as often as possible o Elevate leg(s) parallel to the floor when sitting. Additional Orders / Instructions o Follow Nutritious Diet and Increase Protein Intake Wound Treatment Wound #5 - Lower Leg Wound Laterality: Right, Midline,  Distal Cleanser: Soap and Water 2 x Per Week/15 Days Discharge Instructions: Gently cleanse wound with antibacterial soap, rinse and pat dry prior to dressing wounds Primary Dressing: Iodosorb 40 (g) 2 x Per Week/15 Days Discharge Instructions: Apply IodoSorb to wound bed only as directed. Secondary Dressing: Xtrasorb Large 6x9 (in/in) 2 x Per Week/15 Days Discharge Instructions: Apply to wound as directed. Do not cut. Compression Wrap: Profore Lite LF 3 Multilayer Compression Bandaging System 2 x Per Week/15 Days Discharge Instructions: Apply 3 multi-layer wrap as prescribed. Electronic Signature(s) Signed: 02/20/2021 5:11:22 PM By: Georges Mouse, Minus Breeding RN Signed: 02/21/2021 6:22:42 PM By: Worthy Keeler PA-C Entered By: Georges Mouse, Minus Breeding on 02/20/2021 14:49:59 [name] ([id number]) -------------------------------------------------------------------------------- Problem List Details Patient Name: FOUNTAIN, DERUSHA. Date of Service: 02/20/2021 2:00 PM Medical Record Number: [id number] Patient Account Number: [id number] Date of Birth/Sex: 12/14/1945 (75 y.o. M) Treating RN: Dolan Amen Primary Care Provider: Fulton Reek Other Clinician: Referring Provider: Fulton Reek Treating Provider/Extender: Skipper Cliche in Treatment: 21 Active Problems ICD-10 Encounter Code Description Active Date MDM Diagnosis L03.115 Cellulitis of right lower limb 02/04/2021 No Yes L97.812 Non-pressure chronic ulcer of other part of right lower leg with fat layer 10/08/2020 No Yes exposed E11.622 Type 2 diabetes mellitus with other skin ulcer 02/04/2021 No Yes Z95.4 Presence of other heart-valve replacement 09/24/2020 No Yes Z79.01 Long term (current) use of anticoagulants 09/24/2020 No Yes L84 Corns and  callosities 12/26/2020 No Yes Inactive Problems Resolved Problems ICD-10 Code Description Active Date Resolved Date E11.621 Type 2 diabetes mellitus with foot ulcer 09/24/2020  09/24/2020 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 Date of Service: 02/20/2021 2:00 PM Medical Record Number: [id number] Patient Account Number: [id number] Date of Birth/Sex: 1946/04/20 (76 y.o. M) Treating RN: Dolan Amen Primary Care Provider: Fulton Reek Other Clinician: Referring Provider: Fulton Reek Treating Provider/Extender: Skipper Cliche in Treatment: 21 Subjective Chief Complaint Information obtained from Patient Right LE ulcers and cellulitis History of Present Illness (HPI) 09/24/2020 on evaluation today patient presents today for a heel ulcer that he tells me has been present for about 2 years. He has been seeing podiatry and they have been attempting to manage this including what sounds to be a total contact cast, Unna boot, and just standard dressings otherwise as well. Most recently has been using triple antibiotic ointment. With that being said unfortunately despite everything he really has not had any significant improvement. He tells me that he cannot even really remember exactly how this began but he presumed it may have rubbed on his shoes or something of that nature. 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 He tells me that this does seem to help. Fortunately there is no signs of active infection at this time. His most recent hemoglobin A1c was 8.1 though he knows  this was this year he cannot tell me the exact time. His fluid pills currently to help with some of the lower extremity edema although he does obviously have signs of venous stasis/lymphedema. Currently there is no evidence of active infection. No fevers, chills, nausea, vomiting, or diarrhea. Patient has had fairly recent ABIs which were performed on 07/19/2020 and revealed that he has normal findings in both the ankle and toe locations bilaterally. His ABI on the right was 1.09 on the left was 1.08 with a TBI on the right of 0.88 and on the left of 0.94. Triphasic flow was noted throughout. 10/08/2020 on evaluation today patient appears to be doing pretty well in regard to his left heel currently in fact this is doing a great job and seems to be healing quite nicely. Unfortunately on his right leg he had a pile of wood that actually fell on him injuring his right leg this is somewhat erythematous has me concerned little bit about cellulitis though there is not really a good area to culture at this point. 10/24/2020 upon evaluation today patient appears to be doing well with regard to his heel ulcer. He is showing signs of improvement which is great news. His right leg is completely healed. Overall I feel like he is doing excellent and there is no signs of infection. 11/07/2020 upon evaluation today patient appears to be doing well with regard to his heel ulcer. He tells me that last week when he was unable to come in his wife actually thought that the wound was very close to closing if not closed. Then it began to "reopen again". I  really feel like what may have happened as the collagen may have dried over the wound bed and that because that misunderstanding with thinking that the wound was healing. With that being said I did not see it last week I do not know that for certain. Either way I feel like he is doing great today I see no signs of infection at this point. 11/26/2020 upon inspection today  patient appears to be doing decently well in regard to his heel ulcer. He has been tolerating the dressing changes without complication. Fortunately there is no sign of active infection at this time. No fevers, chills, nausea, vomiting, or diarrhea. 12/10/2020 upon evaluation today patient appears to be doing fairly well in regard to the wound on his heel as well as what appears to be a new wound of the left first metatarsal head plantar aspect. This seems to be an area that was callus that has split as the patient tells me has been trying to walk on his toes more has probably where this came from. With that being said there does not appear to be signs of active infection which is great news. 12/17/2020 upon evaluation today patient actually appears to be making good progress currently. Fortunately there is no evidence of active infection at this time. Overall I feel like he is very close to complete closure. 12/26/2020 upon evaluation today patient appears to be doing excellent in regard to his wounds. In fact I am not certain that these are not even completely healed on initial inspection. Overall I am very pleased with where things stand today. Good news is that they are healed he is actually get ready to go out of town and that will be helpful as well as he will be a full part of the time. Readmission: 02/04/2021 upon evaluation today patient appears to be doing well at this point in regard to his left heel that I previously saw him for. Unfortunately he is having issues with his right lower extremity. He has significant wounds at this point he also has erythema noted there is definite signs of cellulitis which is unfortunate. With that being said I think we do need to address this sooner rather than later. The good news is he did have a nice trip to the beach. He tells me that he had no issues during that time. 4/27; patient on Bactrim. Culture showed methicillin sensitive staph aureus therefore the  Bactrim should be effective. 2 small areas on the right leg are healed the area on the mid aspect of the tibia almost 100% covered in a very adherent necrotic debris. We have been using silver alginate 02/20/2021 upon evaluation today patient appears to be doing well with regard to his wound. He is showing signs of improvement which is great news overall very pleased with where things stand today. No fevers, chills, nausea, vomiting, or diarrhea. ORACIO, GALEN ([id number]) Objective Constitutional Well-nourished and well-hydrated in no acute distress. Vitals Time Taken: 2:31 PM, Height: 70 in, Weight: 262 lbs, BMI: 37.6, Temperature: 98.2 F, Pulse: 80 bpm, Respiratory Rate: 18 breaths/min, Blood Pressure: 117/69 mmHg. Respiratory normal breathing without difficulty. Psychiatric this patient is able to make decisions and demonstrates good insight into disease process. Alert and Oriented x 3. pleasant and cooperative. General Notes: Patient's wound does not appear to show any signs of need for sharp debridement which is great news. With that being said I do think that he is tolerating the compression wrap without complication. And  the only issue is that it slid down. If that happens again in the future he knows to contact me let me know. Otherwise we can stick with the Iodoflex as needed doing well. Integumentary (Hair, Skin) Wound #5 status is Open. Original cause of wound was Blister. The date acquired was: 02/02/2021. The wound has been in treatment 2 weeks. The wound is located on the Right,Distal,Midline Lower Leg. The wound measures 2cm length x 4.7cm width x 0.1cm depth; 7.383cm^2 area and 0.738cm^3 volume. There is Fat Layer (Subcutaneous Tissue) exposed. There is no tunneling or undermining noted. There is a medium amount of serosanguineous drainage noted. There is small (1-33%) pink granulation within the wound bed. There is a large (67-100%) amount of necrotic tissue within the  wound bed including Adherent Slough. 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 There was a Three Layer Compression Therapy Procedure by Dolan Amen, RN. Post procedure Diagnosis Wound #5: Same as Pre-Procedure Notes: pt tolerating wrap well. Plan Follow-up Appointments: Return Appointment in 1 week. Nurse Visit as needed Bathing/ Shower/ Hygiene: May shower with wound dressing protected with water repellent cover or cast protector. Edema Control - Lymphedema / Segmental Compressive Device / Other: Optional: One layer of unna paste to top of compression wrap (to act as an anchor). Elevate, Exercise Daily and Avoid Standing for Long Periods of Time. Elevate legs to the level of the heart and pump ankles as often as possible Elevate leg(s) parallel to the floor when sitting. OLIVIA, ROYSE ([id number]) Additional Orders / Instructions: Follow Nutritious Diet and Increase Protein Intake WOUND #5: - Lower Leg Wound Laterality: Right, Midline, Distal Cleanser: Soap and Water 2 x Per Week/15 Days Discharge Instructions: Gently cleanse wound with antibacterial soap, rinse and pat dry prior to dressing wounds Primary Dressing: Iodosorb 40 (g) 2 x Per Week/15 Days Discharge Instructions: Apply IodoSorb to wound bed only as directed. Secondary Dressing: Xtrasorb Large 6x9 (in/in) 2 x Per Week/15 Days Discharge Instructions: Apply to wound as directed. Do not cut. Compression Wrap: Profore Lite LF 3 Multilayer Compression Bandaging System 2 x Per Week/15 Days Discharge  Instructions: Apply 3 multi-layer wrap as prescribed. 1. Would recommend that we continue with the Iodosorb which has been well for him over the past week Clean so we will continue as such. 2. I am also can recommend to continue with 3 layer compression wrap with undertaker. 3. I am also can recommend that we have the patient continue to use the XtraSorb to catch any excessive drainage I think that is also important. We will see patient back for reevaluation in 1 week here in the clinic. If anything worsens or changes patient will contact our office for additional recommendations. Electronic Signature(s) Signed: 02/20/2021 2:51:41 PM By: Worthy Keeler PA-C Entered By: Worthy Keeler on 02/20/2021 14:51:40 [name] ([id number]) -------------------------------------------------------------------------------- SuperBill Details Patient Name: HENRYK, URSIN. Date of Service: 02/20/2021 Medical Record Number: [id number] Patient Account Number: [id number] Date of Birth/Sex: 13-Dec-1945 (75 y.o. 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 Z95.4 Presence of other heart-valve replacement Z79.01 Long term (current) use of anticoagulants L84 Corns and callosities Facility Procedures CPT4 Code: 95284132 Description: (Facility Use Only) [phone] - APPLY MULTLAY COMPRS LWR RT LEG Modifier: Quantity: 1 Physician Procedures CPT4 Code: 2536644 Description: 03474 - WC PHYS LEVEL 3 - EST PT Modifier: Quantity: 1 CPT4 Code: Description: ICD-10 Diagnosis Description L03.115 Cellulitis of right lower limb L97.812  Non-pressure chronic ulcer of other part of right lower leg with fat la E11.622 Type 2 diabetes mellitus with other skin ulcer Z95.4 Presence of other heart-valve  replacement Modifier: yer exposed Quantity: Electronic Signature(s) Signed: 02/20/2021 2:51:51 PM By: Worthy Keeler PA-C Entered By: Worthy Keeler on 02/20/2021 14:51:51

## 2021-02-20 NOTE — Progress Notes (Addendum)
[name] ([id number]) Visit Report for 02/20/2021 Arrival Information Details Patient Name: [name], [name]. Date of Service: 02/20/2021 2:00 PM Medical Record Number: [id number] Patient Account Number: [id number] Date of Birth/Sex: [birth year] (75 y.o. M) Treating RN: Carlene Coria Primary Care Diarra Kos: Fulton Reek Other Clinician: Referring Winson Eichorn: Fulton Reek Treating Trinette Vera/Extender: Skipper Cliche in Treatment: 21 Visit Information History Since Last Visit All ordered tests and consults were completed: No Patient Arrived: [name] Added or deleted any medications: No Arrival Time: 14:28 Any new allergies or adverse reactions: No Accompanied By: self Had a fall or experienced change in No Transfer Assistance: None activities of daily living that may affect Patient Identification Verified: Yes risk of falls: Secondary Verification Process Completed: Yes Signs or symptoms of abuse/neglect since last visito No Patient Requires Transmission-Based No Hospitalized since last visit: No Precautions: Implantable device outside of the clinic excluding No Patient Has Alerts: Yes cellular tissue based products placed in the center Patient Alerts: Type II Diabetic since last visit: 81 mg Aspirin Has Dressing in Place as Prescribed: Yes Warfarin Has Compression in Place as Prescribed: Yes ABI R1.09 TBI.88 07/19/20 Pain Present Now: No ABI L1.12 TBI .94 07/19/20 Electronic Signature(s) Signed: 03/03/2021 8:33:59 AM By: Carlene Coria RN Entered By: Carlene Coria on 02/20/2021 14:31:48 [name] ([id number]) -------------------------------------------------------------------------------- Clinic Level of Care Assessment Details Patient Name: [name]. Date of Service: 02/20/2021 2:00 PM Medical Record Number: [id number] Patient Account Number: [id number] Date of Birth/Sex: [birth year] (75 y.o. M) Treating RN: Dolan Amen Primary Care Rochella Benner: Fulton Reek Other Clinician: Referring Fatimah Sundquist: Fulton Reek Treating Hermenia Fritcher/Extender: Skipper Cliche in Treatment: 21 Clinic Level of Care Assessment Items TOOL 1 Quantity Score []  - Use when EandM and Procedure is performed on INITIAL visit 0 ASSESSMENTS - Nursing Assessment / Reassessment []  - General Physical Exam (combine w/ comprehensive assessment (listed just below) when performed on new 0 pt. evals) []  - 0 Comprehensive Assessment (HX, ROS, Risk Assessments, Wounds Hx, etc.) ASSESSMENTS - Wound and Skin Assessment / Reassessment []  - Dermatologic / Skin Assessment (not related to wound area) 0 ASSESSMENTS - Ostomy and/or Continence Assessment and Care []  - Incontinence Assessment and Management 0 []  - 0 Ostomy Care Assessment and Management (repouching, etc.) PROCESS - Coordination of Care []  - Simple Patient / Family Education for ongoing care 0 []  - 0 Complex (extensive) Patient / Family Education for ongoing care []  - 0 Staff obtains Programmer, systems, Records, Test Results / Process Orders []  - 0 Staff telephones HHA, Nursing Homes / Clarify orders / etc []  - 0 Routine Transfer to another Facility (non-emergent condition) []  - 0 Routine Hospital Admission (non-emergent condition) []  - 0 New Admissions / Biomedical engineer / Ordering NPWT, Apligraf, etc. []  - 0 Emergency Hospital Admission (emergent condition) PROCESS - Special Needs []  - Pediatric / Minor Patient Management 0 []  - 0 Isolation Patient Management []  - 0 Hearing / Language / Visual special needs []  - 0 Assessment of Community assistance (transportation, D/C planning, etc.) []  - 0 Additional assistance / Altered mentation []  - 0 Support Surface(s) Assessment (bed, cushion, seat, etc.) INTERVENTIONS - Miscellaneous []  - External ear exam 0 []  - 0 Patient Transfer (multiple staff / Civil Service fast streamer / Similar devices) []  - 0 Simple Staple / Suture removal (25 or less) []  - 0 Complex Staple /  Suture removal (26 or more) []  - 0 Hypo/Hyperglycemic Management (do not check if billed separately) []  - 0 Ankle / Brachial Index (ABI) -  do not check if billed separately Has the patient been seen at the hospital within the last three years: Yes Total Score: 0 Level Of Care: ____ [name] ([id number]) Electronic Signature(s) Signed: 02/20/2021 5:11:22 PM By: Georges Mouse, Minus Breeding RN Entered By: Georges Mouse, Minus Breeding on 02/20/2021 14:50:04 [name] ([id number]) -------------------------------------------------------------------------------- Compression Therapy Details Patient Name: [name]. Date of Service: 02/20/2021 2:00 PM Medical Record Number: [id number] Patient Account Number: [id number] Date of Birth/Sex: [birth year] (75 y.o. M) Treating RN: Dolan Amen Primary Care Hisako Bugh: Fulton Reek Other Clinician: Referring Willow Reczek: Fulton Reek Treating Ibeth Fahmy/Extender: Skipper Cliche in Treatment: 21 Compression Therapy Performed for Wound Assessment: Wound #5 Right,Distal,Midline Lower Leg Performed By: Cora Daniels, RN Compression Type: Three Layer Post Procedure Diagnosis Same as Pre-procedure Notes pt tolerating wrap well Electronic Signature(s) Signed: 02/20/2021 5:11:22 PM By: Georges Mouse, Minus Breeding RN Entered By: Georges Mouse, Minus Breeding on 02/20/2021 14:46:57 [name] ([id number]) -------------------------------------------------------------------------------- Encounter Discharge Information Details Patient Name: [name]. Date of Service: 02/20/2021 2:00 PM Medical Record Number: [id number] Patient Account Number: [id number] Date of Birth/Sex: 1946/09/09 (75 y.o. M) Treating RN: Dolan Amen Primary Care Jillianna Stanek: Fulton Reek Other Clinician: Referring Moriah Shawley: Fulton Reek Treating Dontavion Noxon/Extender: Skipper Cliche in Treatment: 21 Encounter Discharge Information Items Discharge  Condition: Stable Ambulatory Status: Cane Discharge Destination: Home Transportation: Private Auto Accompanied By: self Schedule Follow-up Appointment: Yes Clinical Summary of Care: Electronic Signature(s) Signed: 02/20/2021 4:47:27 PM By: Jeanine Luz Entered By: Jeanine Luz on 02/20/2021 15:13:53 [name] ([id number]) -------------------------------------------------------------------------------- Lower Extremity Assessment Details Patient Name: GIULIAN, GOLDRING. Date of Service: 02/20/2021 2:00 PM Medical Record Number: [id number] Patient Account Number: [id number] Date of Birth/Sex: [birth year] (75 y.o. M) Treating RN: Carlene Coria Primary Care Angeni Chaudhuri: Fulton Reek Other Clinician: Referring Geovana Gebel: Fulton Reek Treating Ursula Dermody/Extender: Skipper Cliche in Treatment: 21 Edema Assessment Assessed: [Left: No] [Right: No] Edema: [Left: Ye] [Right: s] Calf Left: Right: Point of Measurement: 34 cm From Medial Instep 43 cm Ankle Left: Right: Point of Measurement: 12 cm From Medial Instep 24 cm Vascular Assessment Pulses: Dorsalis Pedis Palpable: [Right:Yes] Electronic Signature(s) Signed: 03/03/2021 8:33:59 AM By: Carlene Coria RN Entered By: Carlene Coria on 02/20/2021 14:41:48 [name] ([id number]) -------------------------------------------------------------------------------- Multi Wound Chart Details Patient Name: [name]. Date of Service: 02/20/2021 2:00 PM Medical Record Number: [id number] Patient Account Number: [id number] Date of Birth/Sex: [birth year] (75 y.o. M) Treating RN: Dolan Amen Primary Care Thierno Hun: Fulton Reek Other Clinician: Referring Aydian Dimmick: Fulton Reek Treating Samyiah Halvorsen/Extender: Skipper Cliche in Treatment: 21 Vital Signs Height(in): 70 Pulse(bpm): 46 Weight(lbs): 84 Blood Pressure(mmHg): 117/69 Body Mass Index(BMI): 38 Temperature(F): 98.2 Respiratory Rate(breaths/min): 18 Photos:  [N/A:N/A] Wound Location: Right, Distal, Midline Lower Leg N/A N/A Wounding Event: Blister N/A N/A Primary Etiology: Diabetic Wound/Ulcer of the Lower N/A N/A Extremity Comorbid History: Cataracts, Arrhythmia, Coronary N/A N/A Artery Disease, Hypertension, Type II Diabetes, Osteoarthritis, Neuropathy Date Acquired: 02/02/2021 N/A N/A Weeks of Treatment: 2 N/A N/A Wound Status: Open N/A N/A Measurements L x W x D (cm) 2x4.7x0.1 N/A N/A Area (cm) : 7.383 N/A N/A Volume (cm) : 0.738 N/A N/A % Reduction in Area: 17.80% N/A N/A % Reduction in Volume: 17.80% N/A N/A Classification: Grade 1 N/A N/A Exudate Amount: Medium N/A N/A Exudate Type: Serosanguineous N/A N/A Exudate Color: red, brown N/A N/A Granulation Amount: Small (1-33%) N/A N/A Granulation Quality: Pink N/A N/A Necrotic Amount: Large (67-100%) N/A N/A Exposed Structures: Fat Layer (Subcutaneous Tissue): N/A N/A Yes Fascia:  No Tendon: No Muscle: No Joint: No Bone: No Epithelialization: Small (1-33%) N/A N/A Treatment Notes Electronic Signature(s) Signed: 02/20/2021 5:11:22 PM By: Georges Mouse, Minus Breeding RN Entered By: Georges Mouse, Minus Breeding on 02/20/2021 14:46:35 [name] ([id number]) -------------------------------------------------------------------------------- Excelsior Details Patient Name: CORBEN, AUZENNE. Date of Service: 02/20/2021 2:00 PM Medical Record Number: [id number] Patient Account Number: [id number] Date of Birth/Sex: 03-01-1946 (75 y.o. M) Treating RN: Dolan Amen Primary Care [name] Humphreys: Fulton Reek Other Clinician: Referring Deiona Hooper: Fulton Reek Treating Frances Ambrosino/Extender: Skipper Cliche in Treatment: 21 Active Inactive Necrotic Tissue Nursing Diagnoses: Impaired tissue integrity related to necrotic/devitalized tissue Goals: Necrotic/devitalized tissue will be minimized in the wound bed Date Initiated: 02/12/2021 Target Resolution Date: 02/12/2021 Goal  Status: Active Interventions: Assess patient pain level pre-, during and post procedure and prior to discharge Notes: Electronic Signature(s) Signed: 02/20/2021 5:11:22 PM By: Georges Mouse, Minus Breeding RN Entered By: Georges Mouse, Minus Breeding on 02/20/2021 14:46:14 [name] ([id number]) -------------------------------------------------------------------------------- Pain Assessment Details Patient Name: HATCHER, FRONING. Date of Service: 02/20/2021 2:00 PM Medical Record Number: [id number] Patient Account Number: [id number] Date of Birth/Sex: 07-19-1946 (75 y.o. M) Treating RN: Carlene Coria Primary Care Jaryiah Mehlman: Fulton Reek Other Clinician: Referring Ford Peddie: Fulton Reek Treating Charnika Herbst/Extender: Skipper Cliche in Treatment: 21 Active Problems Location of Pain Severity and Description of Pain Patient Has Paino No Site Locations Pain Management and Medication Current Pain Management: Electronic Signature(s) Signed: 03/03/2021 8:33:59 AM By: Carlene Coria RN Entered By: Carlene Coria on 02/20/2021 14:32:30 [name] ([id number]) -------------------------------------------------------------------------------- Patient/Caregiver Education Details Patient Name: RAFFAEL, BUGARIN. Date of Service: 02/20/2021 2:00 PM Medical Record Number: [id number] Patient Account Number: [id number] Date of Birth/Gender: [birth year] (75 y.o. M) Treating RN: Dolan Amen Primary Care Physician: Fulton Reek Other Clinician: Referring Physician: Fulton Reek Treating Physician/Extender: Skipper Cliche in Treatment: 21 Education Assessment Education Provided To: Patient Education Topics Provided Wound/Skin Impairment: Methods: Explain/Verbal Responses: State content correctly Electronic Signature(s) Signed: 02/20/2021 5:11:22 PM By: Georges Mouse, Minus Breeding RN Entered By: Georges Mouse, Minus Breeding on 02/20/2021 14:50:22 [name]  ([id number]) -------------------------------------------------------------------------------- Wound Assessment Details Patient Name: JARIS, KOHLES. Date of Service: 02/20/2021 2:00 PM Medical Record Number: [id number] Patient Account Number: [id number] Date of Birth/Sex: Nov 27, 1945 (75 y.o. M) Treating RN: Carlene Coria Primary Care Jemiah Ellenburg: Fulton Reek Other Clinician: Referring Bryahna Lesko: Fulton Reek Treating Kaleeya Hancock/Extender: Skipper Cliche in Treatment: 21 Wound Status Wound Number: 5 Primary Diabetic Wound/Ulcer of the Lower Extremity Etiology: Wound Location: Right, Distal, Midline Lower Leg Wound Open Wounding Event: Blister Status: Date Acquired: 02/02/2021 Comorbid Cataracts, Arrhythmia, Coronary Artery Disease, Weeks Of Treatment: 2 History: Hypertension, Type II Diabetes, Osteoarthritis, Clustered Wound: No Neuropathy Photos Wound Measurements Length: (cm) 2 Width: (cm) 4.7 Depth: (cm) 0.1 Area: (cm) 7.383 Volume: (cm) 0.738 % Reduction in Area: 17.8% % Reduction in Volume: 17.8% Epithelialization: Small (1-33%) Tunneling: No Undermining: No Wound Description Classification: Grade 1 Exudate Amount: Medium Exudate Type: Serosanguineous Exudate Color: red, brown Foul Odor After Cleansing: No Slough/Fibrino Yes Wound Bed Granulation Amount: Small (1-33%) Exposed Structure Granulation Quality: Pink Fascia Exposed: No Necrotic Amount: Large (67-100%) Fat Layer (Subcutaneous Tissue) Exposed: Yes Necrotic Quality: Adherent Slough Tendon Exposed: No Muscle Exposed: No Joint Exposed: No Bone Exposed: No Electronic Signature(s) Signed: 03/03/2021 8:33:59 AM By: Carlene Coria RN Entered By: Carlene Coria on 02/20/2021 14:41:16 [name] ([id number]) -------------------------------------------------------------------------------- Vitals Details Patient Name: [name]. Date of Service: 02/20/2021 2:00 PM Medical Record Number:  [id number] Patient Account Number: [id number] Date  of Birth/Sex: 1946-02-22 (75 y.o. M) Treating RN: Carlene Coria Primary Care Aurea Aronov: Fulton Reek Other Clinician: Referring Temprance Wyre: Fulton Reek Treating Xanthe Couillard/Extender: Skipper Cliche in Treatment: 21 Vital Signs Time Taken: 14:31 Temperature (F): 98.2 Height (in): 70 Pulse (bpm): 80 Weight (lbs): 262 Respiratory Rate (breaths/min): 18 Body Mass Index (BMI): 37.6 Blood Pressure (mmHg): 117/69 Reference Range: 80 - 120 mg / dl Electronic Signature(s) Signed: 03/03/2021 8:33:59 AM By: Carlene Coria RN Entered By: Carlene Coria on 02/20/2021 14:32:23

## 2021-02-27 ENCOUNTER — Other Ambulatory Visit: Payer: Self-pay

## 2021-02-27 ENCOUNTER — Encounter: Payer: Medicare HMO | Admitting: Physician Assistant

## 2021-02-27 DIAGNOSIS — E11622 Type 2 diabetes mellitus with other skin ulcer: Secondary | ICD-10-CM | POA: Diagnosis not present

## 2021-02-27 NOTE — Progress Notes (Addendum)
[name], [name] ([id number]) Visit Report for 02/27/2021 Arrival Information Details Patient Name: [name], [name]. Date of Service: 02/27/2021 2:45 PM Medical Record Number: [id number] Patient Account Number: [id number] Date of Birth/Sex: [birth year] (74 y.o. M) Treating RN: Dolan Amen Primary Care Ratasha Fabre: Fulton Reek Other Clinician: Referring Sharry Beining: Fulton Reek Treating Indya Oliveria/Extender: Skipper Cliche in Treatment: 83 Visit Information History Since Last Visit Added or deleted any medications: No Patient Arrived: [name] Had a fall or experienced change in No Arrival Time: 14:50 activities of daily living that may affect Accompanied By: self risk of falls: Transfer Assistance: None Hospitalized since last visit: No Patient Identification Verified: Yes Pain Present Now: Yes Secondary Verification Process Completed: Yes Patient Requires Transmission-Based No Precautions: Patient Has Alerts: Yes Patient Alerts: Type II Diabetic 81 mg Aspirin Warfarin ABI R1.09 TBI.88 07/19/20 ABI L1.12 TBI .94 07/19/20 Electronic Signature(s) Signed: 02/27/2021 5:03:57 PM By: Jeanine Luz Entered By: Jeanine Luz on 02/27/2021 14:50:41 [name] ([id number]) -------------------------------------------------------------------------------- Clinic Level of Care Assessment Details Patient Name: [name], [name]. Date of Service: 02/27/2021 2:45 PM Medical Record Number: [id number] Patient Account Number: [id number] Date of Birth/Sex: 1946/06/28 (75 y.o. M) Treating RN: Dolan Amen Primary Care Deion Swift: Fulton Reek Other Clinician: Referring Derrich Gaby: Fulton Reek Treating Marlissa Emerick/Extender: Skipper Cliche in Treatment: 22 Clinic Level of Care Assessment Items TOOL 1 Quantity Score []  - Use when EandM and Procedure is performed on INITIAL visit 0 ASSESSMENTS - Nursing Assessment / Reassessment []  - General Physical Exam (combine w/  comprehensive assessment (listed just below) when performed on new 0 pt. evals) []  - 0 Comprehensive Assessment (HX, ROS, Risk Assessments, Wounds Hx, etc.) ASSESSMENTS - Wound and Skin Assessment / Reassessment []  - Dermatologic / Skin Assessment (not related to wound area) 0 ASSESSMENTS - Ostomy and/or Continence Assessment and Care []  - Incontinence Assessment and Management 0 []  - 0 Ostomy Care Assessment and Management (repouching, etc.) PROCESS - Coordination of Care []  - Simple Patient / Family Education for ongoing care 0 []  - 0 Complex (extensive) Patient / Family Education for ongoing care []  - 0 Staff obtains Programmer, systems, Records, Test Results / Process Orders []  - 0 Staff telephones HHA, Nursing Homes / Clarify orders / etc []  - 0 Routine Transfer to another Facility (non-emergent condition) []  - 0 Routine Hospital Admission (non-emergent condition) []  - 0 New Admissions / Biomedical engineer / Ordering NPWT, Apligraf, etc. []  - 0 Emergency Hospital Admission (emergent condition) PROCESS - Special Needs []  - Pediatric / Minor Patient Management 0 []  - 0 Isolation Patient Management []  - 0 Hearing / Language / Visual special needs []  - 0 Assessment of Community assistance (transportation, D/C planning, etc.) []  - 0 Additional assistance / Altered mentation []  - 0 Support Surface(s) Assessment (bed, cushion, seat, etc.) INTERVENTIONS - Miscellaneous []  - External ear exam 0 []  - 0 Patient Transfer (multiple staff / Civil Service fast streamer / Similar devices) []  - 0 Simple Staple / Suture removal (25 or less) []  - 0 Complex Staple / Suture removal (26 or more) []  - 0 Hypo/Hyperglycemic Management (do not check if billed separately) []  - 0 Ankle / Brachial Index (ABI) - do not check if billed separately Has the patient been seen at the hospital within the last three years: Yes Total Score: 0 Level Of Care: ____ [name] ([id number]) Electronic  Signature(s) Signed: 02/27/2021 5:06:40 PM By: Georges Mouse, Minus Breeding RN Entered By: Georges Mouse, Minus Breeding on 02/27/2021 15:23:00 [name] ([id number]) -------------------------------------------------------------------------------- Compression Therapy  Details Patient Name: [name], [name]. Date of Service: 02/27/2021 2:45 PM Medical Record Number: [id number] Patient Account Number: [id number] Date of Birth/Sex: Oct 11, 1946 (75 y.o. M) Treating RN: Dolan Amen Primary Care Anajulia Leyendecker: Fulton Reek Other Clinician: Referring Trezure Cronk: Fulton Reek Treating Leily Capek/Extender: Skipper Cliche in Treatment: 22 Compression Therapy Performed for Wound Assessment: Wound #5 Right,Distal,Midline Lower Leg Performed By: Cora Daniels, RN Compression Type: Three Layer Post Procedure Diagnosis Same as Pre-procedure Notes pt tolerating wrap well Electronic Signature(s) Signed: 02/27/2021 5:06:40 PM By: Georges Mouse, Minus Breeding RN Entered By: Georges Mouse, Minus Breeding on 02/27/2021 15:22:06 [name] ([id number]) -------------------------------------------------------------------------------- Lower Extremity Assessment Details Patient Name: [name], [name]. Date of Service: 02/27/2021 2:45 PM Medical Record Number: [id number] Patient Account Number: [id number] Date of Birth/Sex: 26-Oct-1945 (75 y.o. M) Treating RN: Dolan Amen Primary Care Yazmeen Woolf: Fulton Reek Other Clinician: Referring Adelaido Nicklaus: Fulton Reek Treating Shrey Boike/Extender: Skipper Cliche in Treatment: 22 Edema Assessment Assessed: [Left: No] Patrice Paradise: Yes] Edema: [Left: Ye] [Right: s] Calf Left: Right: Point of Measurement: 34 cm From Medial Instep 40 cm Ankle Left: Right: Point of Measurement: 12 cm From Medial Instep 24 cm Vascular Assessment Pulses: Dorsalis Pedis Palpable: [Right:Yes] Electronic Signature(s) Signed: 02/27/2021 5:03:57 PM By: Jeanine Luz Signed:  02/27/2021 5:06:40 PM By: Georges Mouse, Minus Breeding RN Entered By: Jeanine Luz on 02/27/2021 15:06:22 [name] ([id number]) -------------------------------------------------------------------------------- Multi Wound Chart Details Patient Name: [name], [name]. Date of Service: 02/27/2021 2:45 PM Medical Record Number: [id number] Patient Account Number: [id number] Date of Birth/Sex: 1946-06-29 (75 y.o. M) Treating RN: Dolan Amen Primary Care Aleane Wesenberg: Fulton Reek Other Clinician: Referring Mayla Biddy: Fulton Reek Treating Evangaline Jou/Extender: Skipper Cliche in Treatment: 22 Vital Signs Height(in): 70 Pulse(bpm): 85 Weight(lbs): 10 Blood Pressure(mmHg): 144/71 Body Mass Index(BMI): 38 Temperature(F): 98.3 Respiratory Rate(breaths/min): 20 Photos: [N/A:N/A] Wound Location: Right, Distal, Midline Lower Leg N/A N/A Wounding Event: Blister N/A N/A Primary Etiology: Diabetic Wound/Ulcer of the Lower N/A N/A Extremity Comorbid History: Cataracts, Arrhythmia, Coronary N/A N/A Artery Disease, Hypertension, Type II Diabetes, Osteoarthritis, Neuropathy Date Acquired: 02/02/2021 N/A N/A Weeks of Treatment: 3 N/A N/A Wound Status: Open N/A N/A Measurements L x W x D (cm) 1.8x4x0.1 N/A N/A Area (cm) : 5.655 N/A N/A Volume (cm) : 0.565 N/A N/A % Reduction in Area: 37.10% N/A N/A % Reduction in Volume: 37.10% N/A N/A Classification: Grade 1 N/A N/A Exudate Amount: Small N/A N/A Exudate Type: Serous N/A N/A Exudate Color: amber N/A N/A Granulation Amount: Large (67-100%) N/A N/A Granulation Quality: Pink N/A N/A Necrotic Amount: Small (1-33%) N/A N/A Exposed Structures: Fat Layer (Subcutaneous Tissue): N/A N/A Yes Fascia: No Tendon: No Muscle: No Joint: No Bone: No Epithelialization: Small (1-33%) N/A N/A Treatment Notes Electronic Signature(s) Signed: 02/27/2021 5:06:40 PM By: Georges Mouse, Minus Breeding RN Entered By: Georges Mouse, Minus Breeding on 02/27/2021  15:21:40 [name] ([id number]) -------------------------------------------------------------------------------- Multi-Disciplinary Care Plan Details Patient Name: [name], [name]. Date of Service: 02/27/2021 2:45 PM Medical Record Number: [id number] Patient Account Number: [id number] Date of Birth/Sex: 1945/11/27 (75 y.o. M) Treating RN: Dolan Amen Primary Care Ryllie Nieland: Fulton Reek Other Clinician: Referring Izaiah Tabb: Fulton Reek Treating Andras Grunewald/Extender: Skipper Cliche in Treatment: 22 Active Inactive Necrotic Tissue Nursing Diagnoses: Impaired tissue integrity related to necrotic/devitalized tissue Goals: Necrotic/devitalized tissue will be minimized in the wound bed Date Initiated: 02/12/2021 Target Resolution Date: 02/12/2021 Goal Status: Active Interventions: Assess patient pain level pre-, during and post procedure and prior to discharge Notes: Electronic Signature(s) Signed: 02/27/2021 5:06:40 PM By: Georges Mouse, Minus Breeding  RN Entered By: Georges Mouse, Minus Breeding on 02/27/2021 15:21:28 [name] ([id number]) -------------------------------------------------------------------------------- Pain Assessment Details Patient Name: [name], [name]. Date of Service: 02/27/2021 2:45 PM Medical Record Number: [id number] Patient Account Number: [id number] Date of Birth/Sex: [birth year] (75 y.o. M) Treating RN: Dolan Amen Primary Care Chandra Asher: Fulton Reek Other Clinician: Referring Lynniah Janoski: Fulton Reek Treating Paris Chir[place]oga/Extender: Skipper Cliche in Treatment: 22 Active Problems Location of Pain Severity and Description of Pain Patient Has Paino Yes Site Locations Pain Location: Generalized Pain Rate the pain. Current Pain Level: 7 Pain Management and Medication Current Pain Management: Electronic Signature(s) Signed: 02/27/2021 5:03:57 PM By: Jeanine Luz Signed: 02/27/2021 5:06:40 PM By: Georges Mouse, Minus Breeding RN Entered By:  Jeanine Luz on 02/27/2021 14:54:24 [name] ([id number]) -------------------------------------------------------------------------------- Patient/Caregiver Education Details Patient Name: [name], [name]. Date of Service: 02/27/2021 2:45 PM Medical Record Number: [id number] Patient Account Number: [id number] Date of Birth/Gender: 1945/12/25 (75 y.o. M) Treating RN: Dolan Amen Primary Care Physician: Fulton Reek Other Clinician: Referring Physician: Fulton Reek Treating Physician/Extender: Skipper Cliche in Treatment: 22 Education Assessment Education Provided To: Patient Education Topics Provided Wound/Skin Impairment: Methods: Explain/Verbal Responses: State content correctly Electronic Signature(s) Signed: 02/27/2021 5:06:40 PM By: Georges Mouse, Minus Breeding RN Entered By: Georges Mouse, Minus Breeding on 02/27/2021 15:23:14 [name] ([id number]) -------------------------------------------------------------------------------- Wound Assessment Details Patient Name: [name], [name]. Date of Service: 02/27/2021 2:45 PM Medical Record Number: [id number] Patient Account Number: [id number] Date of Birth/Sex: [birth year] (75 y.o. M) Treating RN: Dolan Amen Primary Care Antionette Luster: Fulton Reek Other Clinician: Referring Luree Palla: Fulton Reek Treating Kadesia Robel/Extender: Skipper Cliche in Treatment: 22 Wound Status Wound Number: 5 Primary Diabetic Wound/Ulcer of the Lower Extremity Etiology: Wound Location: Right, Distal, Midline Lower Leg Wound Open Wounding Event: Blister Status: Date Acquired: 02/02/2021 Comorbid Cataracts, Arrhythmia, Coronary Artery Disease, Weeks Of Treatment: 3 History: Hypertension, Type II Diabetes, Osteoarthritis, Clustered Wound: No Neuropathy Photos Wound Measurements Length: (cm) 1.8 Width: (cm) 4 Depth: (cm) 0.1 Area: (cm) 5.655 Volume: (cm) 0.565 % Reduction in Area: 37.1% % Reduction in Volume:  37.1% Epithelialization: Small (1-33%) Tunneling: No Undermining: No Wound Description Classification: Grade 1 Exudate Amount: Small Exudate Type: Serous Exudate Color: amber Foul Odor After Cleansing: No Slough/Fibrino Yes Wound Bed Granulation Amount: Large (67-100%) Exposed Structure Granulation Quality: Pink Fascia Exposed: No Necrotic Amount: Small (1-33%) Fat Layer (Subcutaneous Tissue) Exposed: Yes Necrotic Quality: Adherent Slough Tendon Exposed: No Muscle Exposed: No Joint Exposed: No Bone Exposed: No Electronic Signature(s) Signed: 02/27/2021 5:03:57 PM By: Jeanine Luz Signed: 02/27/2021 5:06:40 PM By: Georges Mouse, Minus Breeding RN Entered By: Jeanine Luz on 02/27/2021 15:04:55 [name] ([id number]) -------------------------------------------------------------------------------- Vitals Details Patient Name: [name], [name]. Date of Service: 02/27/2021 2:45 PM Medical Record Number: [id number] Patient Account Number: [id number] Date of Birth/Sex: [birth year] (75 y.o. M) Treating RN: Dolan Amen Primary Care Jerime Arif: Fulton Reek Other Clinician: Referring Richardson Dubree: Fulton Reek Treating Bretton Tandy/Extender: Skipper Cliche in Treatment: 22 Vital Signs Time Taken: 14:50 Temperature (F): 98.3 Height (in): 70 Pulse (bpm): 90 Weight (lbs): 262 Respiratory Rate (breaths/min): 20 Body Mass Index (BMI): 37.6 Blood Pressure (mmHg): 144/71 Reference Range: 80 - 120 mg / dl Electronic Signature(s) Signed: 02/27/2021 5:03:57 PM By: Jeanine Luz Entered By: Jeanine Luz on 02/27/2021 14:52:56

## 2021-02-27 NOTE — Progress Notes (Addendum)
SHEENA, [name] ([id number]) Visit Report for 02/27/2021 Chief Complaint Document Details Patient Name: [name], [name]. Date of Service: 02/27/2021 2:45 PM Medical Record Number: [id number] Patient Account Number: [id number] Date of Birth/Sex: 10/14/1946 (75 y.o. M) Treating RN: Dolan Amen Primary Care Provider: Fulton Reek Other Clinician: Referring Provider: Fulton Reek Treating Provider/Extender: Skipper Cliche in Treatment: 22 Information Obtained from: Patient Chief Complaint Right LE ulcers and cellulitis Electronic Signature(s) Signed: 02/27/2021 2:43:03 PM By: Worthy Keeler PA-C Entered By: Worthy Keeler on 02/27/2021 14:43:03 [name] ([id number]) -------------------------------------------------------------------------------- HPI Details Patient Name: [name], [name]. Date of Service: 02/27/2021 2:45 PM Medical Record Number: [id number] Patient Account Number: [id number] Date of Birth/Sex: 25-Oct-1945 (75 y.o. M) Treating RN: Dolan Amen Primary Care Provider: Fulton Reek Other Clinician: Referring Provider: Fulton Reek Treating Provider/Extender: Skipper Cliche in Treatment: 22 History of Present Illness HPI Description: 09/24/2020 on evaluation today patient presents today for [name] heel ulcer that he tells me has been present for about 2 years. He has been seeing podiatry and they have been attempting to manage this including what sounds to be [name] total contact cast, Unna boot, and just standard dressings otherwise as well. Most recently has been using triple antibiotic ointment. With that being said unfortunately despite everything he really has not had any significant improvement. He tells me that he cannot even really remember exactly how this began but he presumed it may have rubbed on his shoes or something of that nature. 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 He tells me that this does seem to help. Fortunately there is no signs of active infection at this time. His most recent hemoglobin A1c was 8.1 though he knows this was this year he cannot tell me the exact time. His fluid pills currently to help with some of the lower extremity edema although he does obviously have signs of venous stasis/lymphedema. Currently there is no evidence of active infection. No fevers, chills, nausea, vomiting, or diarrhea. Patient has had fairly recent ABIs which were performed on 07/19/2020 and revealed that he has normal findings in both the ankle and toe locations bilaterally. His ABI on the right was 1.09 on the left was 1.08 with [name] TBI on the right of 0.88 and on the left of 0.94. Triphasic flow was noted throughout. 10/08/2020 on evaluation today patient appears to be doing pretty well in regard to his left heel currently in fact this is doing [name] great job and seems to be healing quite nicely. Unfortunately on his right leg he had [name] pile of wood that actually fell on him injuring his right leg this is somewhat erythematous has me concerned little bit about cellulitis though there is not really [name] good area to culture at this point. 10/24/2020 upon evaluation today patient appears to be doing well with regard to his heel ulcer. He is showing signs of improvement which is great news. His right leg is completely healed. Overall I feel like he is doing excellent and there is no signs of infection. 11/07/2020 upon evaluation today patient appears to be doing well with regard to his heel ulcer. He tells me that last week when he was unable to  come in his wife actually thought that the wound was very close to closing if not closed. Then it began to "reopen again". I  really feel like what may have happened as the collagen may have dried over the wound bed and that because that misunderstanding with thinking that the wound was healing. With that being said I did not see it last week I do not know that for certain. Either way I feel like he is doing great today I see no signs of infection at this point. 11/26/2020 upon inspection today patient appears to be doing decently well in regard to his heel ulcer. He has been tolerating the dressing changes without complication. Fortunately there is no sign of active infection at this time. No fevers, chills, nausea, vomiting, or diarrhea. 12/10/2020 upon evaluation today patient appears to be doing fairly well in regard to the wound on his heel as well as what appears to be [name] new wound of the left first metatarsal head plantar aspect. This seems to be an area that was callus that has split as the patient tells me has been trying to walk on his toes more has probably where this came from. With that being said there does not appear to be signs of active infection which is great news. 12/17/2020 upon evaluation today patient actually appears to be making good progress currently. Fortunately there is no evidence of active infection at this time. Overall I feel like he is very close to complete closure. 12/26/2020 upon evaluation today patient appears to be doing excellent in regard to his wounds. In fact I am not certain that these are not even completely healed on initial inspection. Overall I am very pleased with where things stand today. Good news is that they are healed he is actually get ready to go out of town and that will be helpful as well as he will be [name] full part of the time. Readmission: 02/04/2021 upon evaluation today patient appears to be doing well at this point in regard to his left heel that I previously saw him for. Unfortunately he is having issues with his right lower extremity. He has significant wounds at this  point he also has erythema noted there is definite signs of cellulitis which is unfortunate. With that being said I think we do need to address this sooner rather than later. The good news is he did have [name] nice trip to the beach. He tells me that he had no issues during that time. 4/27; patient on Bactrim. Culture showed methicillin sensitive staph aureus therefore the Bactrim should be effective. 2 small areas on the right leg are healed the area on the mid aspect of the tibia almost 100% covered in [name] very adherent necrotic debris. We have been using silver alginate 02/20/2021 upon evaluation today patient appears to be doing well with regard to his wound. He is showing signs of improvement which is great news overall very pleased with where things stand today. No fevers, chills, nausea, vomiting, or diarrhea. 02/27/2021 upon evaluation today patient appears to be doing well with regard to his leg ulcer. He is tolerating the dressing changes and overall appears to be doing quite excellent. I am extremely pleased with where things stand and overall I think patient is making great progress. There is no sign of active infection at this time which is also great news. Electronic Signature(s) Signed: 02/27/2021 6:14:56 PM By: Worthy Keeler PA-C Entered By: Worthy Keeler on 02/27/2021 18:14:55  BYRL, LATIN ([id number], MELOCHE ([id number]) -------------------------------------------------------------------------------- Physical Exam Details Patient Name: CALHOUN, REICHARDT. Date of Service: 02/27/2021 2:45 PM Medical Record Number: [id number] Patient Account Number: [id number] Date of Birth/Sex: September 10, 1946 (75 y.o. M) Treating RN: Dolan Amen Primary Care Provider: Fulton Reek Other Clinician: Referring Provider: Fulton Reek Treating Provider/Extender: Skipper Cliche in Treatment: 66 Constitutional Well-nourished and well-hydrated in no acute  distress. Respiratory normal breathing without difficulty. Psychiatric this patient is able to make decisions and demonstrates good insight into disease process. Alert and Oriented x 3. pleasant and cooperative. Notes Patient's wound bed showed signs of good granulation epithelization at this point. There does not appear to be any evidence of infection which is great and overall feel like he is making great progress. The patient is very pleased with where things stand today. Electronic Signature(s) Signed: 02/27/2021 6:15:10 PM By: Worthy Keeler PA-C Entered By: Worthy Keeler on 02/27/2021 18:15:09 [name] ([id number]) -------------------------------------------------------------------------------- Physician Orders Details Patient Name: ELGIN, CARN. Date of Service: 02/27/2021 2:45 PM Medical Record Number: [id number] Patient Account Number: [id number] Date of Birth/Sex: [birth year] (75 y.o. 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 o Nurse Visit as needed Bathing/ Shower/ Hygiene o May shower with wound dressing protected with water repellent cover or cast protector. Edema Control - Lymphedema / Segmental Compressive Device / Other o Optional: One layer of unna paste to top of compression wrap (to act as an anchor). o Elevate, Exercise Daily and Avoid Standing for Long Periods of  Time. o Elevate legs to the level of the heart and pump ankles as often as possible o Elevate leg(s) parallel to the floor when sitting. Additional Orders / Instructions o Follow Nutritious Diet and Increase Protein Intake Wound Treatment Wound #5 - Lower Leg Wound Laterality: Right, Midline, Distal Cleanser: Soap and Water 2 x Per Week/15 Days Discharge Instructions: Gently cleanse wound with antibacterial soap, rinse and pat dry prior to dressing wounds Primary Dressing: Silvercel Small 2x2 (in/in) 2 x Per Week/15 Days Discharge Instructions: Apply Silvercel Small 2x2 (in/in) as instructed Secondary Dressing: ABD Pad 5x9 (in/in) 2 x Per Week/15 Days Discharge Instructions: Cover with ABD pad Compression Wrap: Profore Lite LF 3 Multilayer Compression Bandaging System 2 x Per Week/15 Days Discharge Instructions: Apply 3 multi-layer wrap as prescribed. Electronic Signature(s) Signed: 02/27/2021 5:06:40 PM By: Georges Mouse, Minus Breeding RN Signed: 02/27/2021 8:15:19 PM By: Worthy Keeler PA-C Entered By: Georges Mouse, Minus Breeding on 02/27/2021 15:22:55 [name] ([id number]) -------------------------------------------------------------------------------- Problem List Details Patient Name: LORANZO, DESHA. Date of Service: 02/27/2021 2:45 PM Medical Record Number: [id number] Patient Account Number: [id number] Date of Birth/Sex: 01-15-1946 (75 y.o. M) Treating RN: Dolan Amen Primary Care Provider: Fulton Reek Other Clinician: Referring Provider: Fulton Reek Treating Provider/Extender: Skipper Cliche in Treatment: 22 Active Problems ICD-10 Encounter Code Description Active Date MDM Diagnosis L03.115 Cellulitis of right lower limb 02/04/2021 No Yes L97.812 Non-pressure chronic ulcer of other part of right lower leg with fat layer 10/08/2020  No Yes exposed E11.622 Type 2 diabetes mellitus with other skin ulcer 02/04/2021 No Yes Z95.4 Presence of other heart-valve  replacement 09/24/2020 No Yes Z79.01 Long term (current) use of anticoagulants 09/24/2020 No Yes L84 Corns and callosities 12/26/2020 No Yes Inactive Problems Resolved Problems ICD-10 Code Description Active Date Resolved Date E11.621 Type 2 diabetes mellitus with foot ulcer 09/24/2020 09/24/2020 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 Date of Service: 02/27/2021 2:45 PM Medical Record Number: [id number] Patient Account Number: [id number] Date of Birth/Sex: 05-15-1946 (75 y.o. M) Treating RN: Dolan Amen Primary Care Provider: Fulton Reek Other Clinician: Referring Provider: Fulton Reek Treating Provider/Extender: Skipper Cliche in Treatment: 40 Subjective Chief Complaint Information obtained from Patient Right LE ulcers and cellulitis History of Present Illness (HPI) 09/24/2020 on evaluation today patient presents today for [name] heel ulcer that he tells me has been present for about 2 years. He has been seeing podiatry and they have been attempting to manage this including what sounds to be [name] total contact cast, Unna boot, and just standard dressings otherwise as well. Most recently has been using triple antibiotic ointment. With that being said unfortunately despite everything he really has not had any significant improvement. He tells me that he cannot even really remember exactly how this began but he presumed it may have rubbed on his shoes or something of that nature. With that being said he tells me that the other issues that he has majorly is the presence of [name] artificial heart valve from replacement as well as being on long-term anticoagulant therapy because  of this. 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 He tells me that this does seem to help. Fortunately there is no signs of active infection at this time. His most recent hemoglobin A1c was 8.1 though he knows this was this year he cannot tell me the exact time. His fluid pills currently to help with some of the lower extremity edema although he does obviously have signs of venous stasis/lymphedema. Currently there is no evidence of active infection. No fevers, chills, nausea, vomiting, or diarrhea. Patient has had fairly recent ABIs which were performed on 07/19/2020 and revealed that he has normal findings in both the ankle and toe locations bilaterally. His ABI on the right was 1.09 on the left was 1.08 with [name] TBI on the right of 0.88 and on the left of 0.94. Triphasic flow was noted throughout. 10/08/2020 on evaluation today patient appears to be doing pretty well in regard to his left heel currently in fact this is doing [name] great job and seems to be healing quite nicely. Unfortunately on his right leg he had [name] pile of wood that actually fell on him injuring his right leg this is somewhat erythematous has me concerned little bit about cellulitis though there is not really [name] good area to culture at this point. 10/24/2020 upon evaluation today patient appears to be doing well with regard to his heel ulcer. He is showing signs of improvement which is great news. His right leg is completely healed. Overall I feel like he is doing excellent and there is no signs of infection. 11/07/2020 upon evaluation today patient appears to be doing well with regard to his  heel ulcer. He tells me that last week when he was unable to come in his wife actually thought that the wound was very close to closing if not closed. Then it began to "reopen again". I really feel like what may have happened as the collagen may have dried over the wound bed  and that because that misunderstanding with thinking that the wound was healing. With that being said I did not see it last week I do not know that for certain. Either way I feel like he is doing great today I see no signs of infection at this point. 11/26/2020 upon inspection today patient appears to be doing decently well in regard to his heel ulcer. He has been tolerating the dressing changes without complication. Fortunately there is no sign of active infection at this time. No fevers, chills, nausea, vomiting, or diarrhea. 12/10/2020 upon evaluation today patient appears to be doing fairly well in regard to the wound on his heel as well as what appears to be [name] new wound of the left first metatarsal head plantar aspect. This seems to be an area that was callus that has split as the patient tells me has been trying to walk on his toes more has probably where this came from. With that being said there does not appear to be signs of active infection which is great news. 12/17/2020 upon evaluation today patient actually appears to be making good progress currently. Fortunately there is no evidence of active infection at this time. Overall I feel like he is very close to complete closure. 12/26/2020 upon evaluation today patient appears to be doing excellent in regard to his wounds. In fact I am not certain that these are not even completely healed on initial inspection. Overall I am very pleased with where things stand today. Good news is that they are healed he is actually get ready to go out of town and that will be helpful as well as he will be [name] full part of the time. Readmission: 02/04/2021 upon evaluation today patient appears to be doing well at this point in regard to his left heel that I previously saw him for. Unfortunately he is having issues with his right lower extremity. He has significant wounds at this point he also has erythema noted there is definite signs of cellulitis which is  unfortunate. With that being said I think we do need to address this sooner rather than later. The good news is he did have [name] nice trip to the beach. He tells me that he had no issues during that time. 4/27; patient on Bactrim. Culture showed methicillin sensitive staph aureus therefore the Bactrim should be effective. 2 small areas on the right leg are healed the area on the mid aspect of the tibia almost 100% covered in [name] very adherent necrotic debris. We have been using silver alginate 02/20/2021 upon evaluation today patient appears to be doing well with regard to his wound. He is showing signs of improvement which is great news overall very pleased with where things stand today. No fevers, chills, nausea, vomiting, or diarrhea. 02/27/2021 upon evaluation today patient appears to be doing well with regard to his leg ulcer. He is tolerating the dressing changes and overall appears to be doing quite excellent. I am extremely pleased with where things stand and overall I think patient is making great progress. There is no sign of active infection at this time which is also great news. RASTUS, BORTON ([id number]) Objective Constitutional  Well-nourished and well-hydrated in no acute distress. Vitals Time Taken: 2:50 PM, Height: 70 in, Weight: 262 lbs, BMI: 37.6, Temperature: 98.3 F, Pulse: 90 bpm, Respiratory Rate: 20 breaths/min, Blood Pressure: 144/71 mmHg. Respiratory normal breathing without difficulty. Psychiatric this patient is able to make decisions and demonstrates good insight into disease process. Alert and Oriented x 3. pleasant and cooperative. General Notes: Patient's wound bed showed signs of good granulation epithelization at this point. There does not appear to be any evidence of infection which is great and overall feel like he is making great progress. The patient is very pleased with where things stand today. Integumentary (Hair, Skin) Wound #5 status is Open. Original  cause of wound was Blister. The date acquired was: 02/02/2021. The wound has been in treatment 3 weeks. The wound is located on the Right,Distal,Midline Lower Leg. The wound measures 1.8cm length x 4cm width x 0.1cm depth; 5.655cm^2 area and 0.565cm^3 volume. There is Fat Layer (Subcutaneous Tissue) exposed. There is no tunneling or undermining noted. There is [name] small amount of serous drainage noted. There is large (67-100%) pink granulation within the wound bed. There is [name] small (1-33%) amount of necrotic tissue within the wound bed including Adherent Slough. 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 There was [name] Three Layer Compression Therapy Procedure by Dolan Amen, RN. Post procedure Diagnosis Wound #5: Same as Pre-Procedure Notes: pt tolerating wrap well. Plan Follow-up Appointments: Return Appointment in 1 week. Nurse Visit as needed Bathing/ Shower/ Hygiene: May shower with wound dressing protected with water repellent cover or cast protector. Edema Control - Lymphedema / Segmental Compressive Device / Other: Optional: One layer of unna paste to top of compression wrap (to act as an anchor). Elevate, Exercise Daily and Avoid Standing for Long Periods of Time. ASHAAD, GAERTNER ([id number]) Elevate legs to the level of the heart and pump ankles as often as possible Elevate leg(s) parallel to the floor when sitting. Additional Orders / Instructions: Follow Nutritious Diet and Increase Protein Intake WOUND #5: - Lower Leg Wound Laterality: Right, Midline,  Distal Cleanser: Soap and Water 2 x Per Week/15 Days Discharge Instructions: Gently cleanse wound with antibacterial soap, rinse and pat dry prior to dressing wounds Primary Dressing: Silvercel Small 2x2 (in/in) 2 x Per Week/15 Days Discharge Instructions: Apply Silvercel Small 2x2 (in/in) as instructed Secondary Dressing: ABD Pad 5x9 (in/in) 2 x Per Week/15 Days Discharge Instructions: Cover with ABD pad Compression Wrap: Profore Lite LF 3 Multilayer Compression Bandaging System 2 x Per Week/15 Days Discharge Instructions: Apply 3 multi-layer wrap as prescribed. 1. Would recommend that we going to continue with the wound care measures as before and the patient is in agreement with that plan. This includes the use of the silver cell followed by an ABD pad to cover and [name] 3 layer compression wrap. 2. I am also can recommend the patient continue to monitor for any signs of worsening this would include increased pain hopefully there will not be any issues as such. We will see patient back for reevaluation in 1 week here in the clinic. If anything worsens or changes patient will contact our  office for additional recommendations. Electronic Signature(s) Signed: 02/27/2021 6:15:37 PM By: Worthy Keeler PA-C Entered By: Worthy Keeler on 02/27/2021 18:15:37 [name] ([id number]) -------------------------------------------------------------------------------- SuperBill Details Patient Name: MARIAN, GRANDT. Date of Service: 02/27/2021 Medical Record Number: [id number] Patient Account Number: [id number] Date of Birth/Sex: [birth year] (75 y.o. M) Treating RN: Dolan Amen Primary Care Provider: Fulton Reek Other Clinician: Referring Provider: Fulton Reek Treating Provider/Extender: Skipper Cliche in Treatment: 22 Diagnosis Coding ICD-10 Codes Code Description L03.115 Cellulitis of right lower limb L97.812 Non-pressure chronic ulcer of other part of right lower leg with fat  layer exposed E11.622 Type 2 diabetes mellitus with other skin ulcer Z95.4 Presence of other heart-valve replacement Z79.01 Long term (current) use of anticoagulants L84 Corns and callosities Facility Procedures CPT4 Code: [id number] Description: (Facility Use Only) [phone] - APPLY MULTLAY COMPRS LWR RT LEG Modifier: Quantity: 1 Physician Procedures CPT4 Code: [id number] Description: 16384 - WC PHYS LEVEL 3 - EST PT Modifier: Quantity: 1 CPT4 Code: Description: ICD-10 Diagnosis Description L03.115 Cellulitis of right lower limb L97.812 Non-pressure chronic ulcer of other part of right lower leg with fat la E11.622 Type 2 diabetes mellitus with other skin ulcer Z95.4 Presence of other heart-valve  replacement Modifier: yer exposed Quantity: Electronic Signature(s) Signed: 02/27/2021 6:15:52 PM By: Worthy Keeler PA-C Previous Signature: 02/27/2021 5:06:40 PM Version By: Georges Mouse, Minus Breeding RN Entered By: Worthy Keeler on 02/27/2021 18:15:52

## 2021-03-06 ENCOUNTER — Encounter: Payer: Medicare HMO | Admitting: Physician Assistant

## 2021-03-06 ENCOUNTER — Other Ambulatory Visit: Payer: Self-pay

## 2021-03-06 DIAGNOSIS — E11622 Type 2 diabetes mellitus with other skin ulcer: Secondary | ICD-10-CM | POA: Diagnosis not present

## 2021-03-06 NOTE — Progress Notes (Addendum)
SPERO, GUNNELS ([id number]) Visit Report for 03/06/2021 Chief Complaint Document Details Patient Name: [name], [name]. Date of Service: 03/06/2021 10:00 AM Medical Record Number: [id number] Patient Account Number: [id number] Date of Birth/Sex: 02/12/1946 (75 y.o. M) Treating RN: Dolan Amen Primary Care Provider: Fulton Reek Other Clinician: Referring Provider: Fulton Reek Treating Provider/Extender: Skipper Cliche in Treatment: 23 Information Obtained from: Patient Chief Complaint Right LE ulcers and cellulitis Electronic Signature(s) Signed: 03/06/2021 10:12:04 AM By: Worthy Keeler PA-C Entered By: Worthy Keeler on 03/06/2021 10:12:03 [name] ([id number]) -------------------------------------------------------------------------------- HPI Details Patient Name: [name], [name]. Date of Service: 03/06/2021 10:00 AM Medical Record Number: [id number] Patient Account Number: [id number] Date of Birth/Sex: 01/25/1946 (75 y.o. M) Treating RN: Dolan Amen Primary Care Provider: Fulton Reek Other Clinician: Referring Provider: Fulton Reek Treating Provider/Extender: Skipper Cliche in Treatment: 23 History of Present Illness HPI Description: 09/24/2020 on evaluation today patient presents today for a heel ulcer that he tells me has been present for about 2 years. He has been seeing podiatry and they have been attempting to manage this including what sounds to be a total contact cast, Unna boot, and just standard dressings otherwise as well. Most recently has been using triple antibiotic ointment. With that being said unfortunately despite everything he really has not had any significant improvement. He tells me that he cannot even really remember exactly how this began but he presumed it may have rubbed on his shoes or something of that nature. 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 He tells me that this does seem to help. Fortunately there is no signs of active infection at this time. His most recent hemoglobin A1c was 8.1 though he knows this was this year he cannot tell me the exact time. His fluid pills currently to help with some of the lower extremity edema although he does obviously have signs of venous stasis/lymphedema. Currently there is no evidence of active infection. No fevers, chills, nausea, vomiting, or diarrhea. Patient has had fairly recent ABIs which were performed on 07/19/2020 and revealed that he has normal findings in both the ankle and toe locations bilaterally. His ABI on the right was 1.09 on the left was 1.08 with a TBI on the right of 0.88 and on the left of 0.94. Triphasic flow was noted throughout. 10/08/2020 on evaluation today patient appears to be doing pretty well in regard to his left heel currently in fact this is doing a great job and seems to be healing quite nicely. Unfortunately on his right leg he had a pile of wood that actually fell on him injuring his right leg this is somewhat erythematous has me concerned little bit about cellulitis though there is not really a good area to culture at this point. 10/24/2020 upon evaluation today patient appears to be doing well with regard to his heel ulcer. He is showing signs of improvement which is great news. His right leg is completely healed. Overall I feel like he is doing excellent and there is no signs of infection. 11/07/2020 upon evaluation today patient appears to be doing well with regard to his heel ulcer. He tells me that last week when he was unable to  come in his wife actually thought that the wound was very close to closing if not closed. Then it began to "reopen again". I  really feel like what may have happened as the collagen may have dried over the wound bed and that because that misunderstanding with thinking that the wound was healing. With that being said I did not see it last week I do not know that for certain. Either way I feel like he is doing great today I see no signs of infection at this point. 11/26/2020 upon inspection today patient appears to be doing decently well in regard to his heel ulcer. He has been tolerating the dressing changes without complication. Fortunately there is no sign of active infection at this time. No fevers, chills, nausea, vomiting, or diarrhea. 12/10/2020 upon evaluation today patient appears to be doing fairly well in regard to the wound on his heel as well as what appears to be a new wound of the left first metatarsal head plantar aspect. This seems to be an area that was callus that has split as the patient tells me has been trying to walk on his toes more has probably where this came from. With that being said there does not appear to be signs of active infection which is great news. 12/17/2020 upon evaluation today patient actually appears to be making good progress currently. Fortunately there is no evidence of active infection at this time. Overall I feel like he is very close to complete closure. 12/26/2020 upon evaluation today patient appears to be doing excellent in regard to his wounds. In fact I am not certain that these are not even completely healed on initial inspection. Overall I am very pleased with where things stand today. Good news is that they are healed he is actually get ready to go out of town and that will be helpful as well as he will be a full part of the time. Readmission: 02/04/2021 upon evaluation today patient appears to be doing well at this point in regard to his left heel that I previously saw him for. Unfortunately he is having issues with his right lower extremity. He has significant wounds at this  point he also has erythema noted there is definite signs of cellulitis which is unfortunate. With that being said I think we do need to address this sooner rather than later. The good news is he did have a nice trip to the beach. He tells me that he had no issues during that time. 4/27; patient on Bactrim. Culture showed methicillin sensitive staph aureus therefore the Bactrim should be effective. 2 small areas on the right leg are healed the area on the mid aspect of the tibia almost 100% covered in a very adherent necrotic debris. We have been using silver alginate 02/20/2021 upon evaluation today patient appears to be doing well with regard to his wound. He is showing signs of improvement which is great news overall very pleased with where things stand today. No fevers, chills, nausea, vomiting, or diarrhea. 02/27/2021 upon evaluation today patient appears to be doing well with regard to his leg ulcer. He is tolerating the dressing changes and overall appears to be doing quite excellent. I am extremely pleased with where things stand and overall I think patient is making great progress. There is no sign of active infection at this time which is also great news. 03/06/2021 upon evaluation today patient appears to be doing excellent in regard to his wounds. In fact he appears  to be completely healed today based on what I am seeing. This is excellent news and overall I am extremely pleased with where he stands. Overall the patient is happy to hear this as well this has been quite sometime coming. Electronic Signature(s) [name], [name] ([id number]) Signed: 03/06/2021 12:54:30 PM By: Worthy Keeler PA-C Entered By: Worthy Keeler on 03/06/2021 12:54:30 [name], [name] ([id number]) -------------------------------------------------------------------------------- Physical Exam Details Patient Name: [name], [name] Date of Service: 03/06/2021 10:00 AM Medical Record Number: [id number] Patient  Account Number: [id number] Date of Birth/Sex: 1946-01-11 (74 y.o. M) Treating RN: Dolan Amen Primary Care Provider: Fulton Reek Other Clinician: Referring Provider: Fulton Reek Treating Provider/Extender: Skipper Cliche in Treatment: 3 Constitutional Well-nourished and well-hydrated in no acute distress. Respiratory normal breathing without difficulty. Psychiatric this patient is able to make decisions and demonstrates good insight into disease process. Alert and Oriented x 3. pleasant and cooperative. Notes Upon evaluation patient's wound showed signs of complete epithelization and overall seem to be doing quite well and extremely pleased in that regard I do not see any evidence of anything worsening or infection at this point which is also great news. Electronic Signature(s) Signed: 03/06/2021 12:55:03 PM By: Worthy Keeler PA-C Entered By: Worthy Keeler on 03/06/2021 12:55:02 [name] ([id number]) -------------------------------------------------------------------------------- Physician Orders Details Patient Name: [name], [name]. Date of Service: 03/06/2021 10:00 AM Medical Record Number: [id number] Patient Account Number: [id number] Date of Birth/Sex: 1946-09-18 (74 y.o. 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 Put garments on first thing when you wake up and remove them before bed. o Moisturize legs daily after removing compression garments. Electronic Signature(s) Signed: 03/06/2021 11:56:40 AM By: Georges Mouse, Minus Breeding RN Signed: 03/06/2021 5:03:18 PM By: Worthy Keeler PA-C Entered By: Georges Mouse, Minus Breeding on 03/06/2021 10:17:06 [name] ([id number]) -------------------------------------------------------------------------------- Problem List Details Patient Name: [name], [name]. Date of Service: 03/06/2021 10:00 AM Medical Record Number: [id number] Patient Account Number: [id number] Date of Birth/Sex: Feb 28, 1946 (74 y.o. M) Treating RN: Dolan Amen Primary Care Provider: Fulton Reek Other Clinician: Referring Provider: Fulton Reek Treating Provider/Extender: Skipper Cliche in Treatment: 23 Active Problems ICD-10 Encounter Code Description Active Date MDM Diagnosis L03.115 Cellulitis of right lower limb 02/04/2021 No Yes L97.812 Non-pressure chronic ulcer of other part of right lower leg with fat layer 10/08/2020 No Yes exposed E11.622 Type 2 diabetes mellitus with other skin ulcer 02/04/2021 No Yes Z95.4 Presence of other heart-valve replacement 09/24/2020 No Yes Z79.01 Long term (current) use of anticoagulants 09/24/2020 No Yes L84 Corns and callosities 12/26/2020 No Yes Inactive Problems Resolved Problems ICD-10 Code Description Active Date Resolved Date E11.621 Type 2 diabetes mellitus with foot ulcer 09/24/2020 09/24/2020 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 Date of  Service: 03/06/2021 10:00 AM Medical Record Number:  [id number] Patient Account Number: [id number] Date of Birth/Sex: 07/29/1946 (74 y.o. M) Treating RN: Dolan Amen Primary Care Provider: Fulton Reek Other Clinician: Referring Provider: Fulton Reek Treating Provider/Extender: Skipper Cliche in Treatment: 23 Subjective Chief Complaint Information obtained from Patient Right LE ulcers and cellulitis History of Present Illness (HPI) 09/24/2020 on evaluation today patient presents today for a heel ulcer that he tells me has been present for about 2 years. He has been seeing podiatry and they have been attempting to manage this including what sounds to be a total contact cast, Unna boot, and just standard dressings otherwise as well. Most recently has been using triple antibiotic ointment. With that being said unfortunately despite everything he really has not had any significant improvement. He tells me that he cannot even really remember exactly how this began but he presumed it may have rubbed on his shoes or something of that nature. 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 He tells me that this does seem to help. Fortunately there is no signs of active infection at this time. His most recent hemoglobin A1c was 8.1 though he knows this was this year he cannot tell me the exact time. His fluid pills currently to help with some of the lower extremity edema although he does obviously have signs of venous stasis/lymphedema. Currently there is no evidence of active infection. No fevers, chills, nausea, vomiting, or diarrhea. Patient has had fairly recent ABIs which were performed on 07/19/2020 and revealed that he has normal findings in both the ankle and toe  locations bilaterally. His ABI on the right was 1.09 on the left was 1.08 with a TBI on the right of 0.88 and on the left of 0.94. Triphasic flow was noted throughout. 10/08/2020 on evaluation today patient appears to be doing pretty well in regard to his left heel currently in fact this is doing a great job and seems to be healing quite nicely. Unfortunately on his right leg he had a pile of wood that actually fell on him injuring his right leg this is somewhat erythematous has me concerned little bit about cellulitis though there is not really a good area to culture at this point. 10/24/2020 upon evaluation today patient appears to be doing well with regard to his heel ulcer. He is showing signs of improvement which is great news. His right leg is completely healed. Overall I feel like he is doing excellent and there is no signs of infection. 11/07/2020 upon evaluation today patient appears to be doing well with regard to his heel ulcer. He tells me that last week when he was unable to come in his wife actually thought that the wound was very close to closing if not closed. Then it began to "reopen again". I really feel like what may have happened as the collagen may have dried over the wound bed and that because that misunderstanding with thinking that the wound was healing. With that being said I did not see it last week I do not know that for certain. Either way I feel like he is doing great today I see no signs of infection at this point. 11/26/2020 upon inspection  today patient appears to be doing decently well in regard to his heel ulcer. He has been tolerating the dressing changes without complication. Fortunately there is no sign of active infection at this time. No fevers, chills, nausea, vomiting, or diarrhea. 12/10/2020 upon evaluation today patient appears to be doing fairly well in regard to the wound on his heel as well as what appears to be a new wound of the left first metatarsal head  plantar aspect. This seems to be an area that was callus that has split as the patient tells me has been trying to walk on his toes more has probably where this came from. With that being said there does not appear to be signs of active infection which is great news. 12/17/2020 upon evaluation today patient actually appears to be making good progress currently. Fortunately there is no evidence of active infection at this time. Overall I feel like he is very close to complete closure. 12/26/2020 upon evaluation today patient appears to be doing excellent in regard to his wounds. In fact I am not certain that these are not even completely healed on initial inspection. Overall I am very pleased with where things stand today. Good news is that they are healed he is actually get ready to go out of town and that will be helpful as well as he will be a full part of the time. Readmission: 02/04/2021 upon evaluation today patient appears to be doing well at this point in regard to his left heel that I previously saw him for. Unfortunately he is having issues with his right lower extremity. He has significant wounds at this point he also has erythema noted there is definite signs of cellulitis which is unfortunate. With that being said I think we do need to address this sooner rather than later. The good news is he did have a nice trip to the beach. He tells me that he had no issues during that time. 4/27; patient on Bactrim. Culture showed methicillin sensitive staph aureus therefore the Bactrim should be effective. 2 small areas on the right leg are healed the area on the mid aspect of the tibia almost 100% covered in a very adherent necrotic debris. We have been using silver alginate 02/20/2021 upon evaluation today patient appears to be doing well with regard to his wound. He is showing signs of improvement which is great news overall very pleased with where things stand today. No fevers, chills, nausea,  vomiting, or diarrhea. 02/27/2021 upon evaluation today patient appears to be doing well with regard to his leg ulcer. He is tolerating the dressing changes and overall appears to be doing quite excellent. I am extremely pleased with where things stand and overall I think patient is making great progress. There is no sign of active infection at this time which is also great news. 03/06/2021 upon evaluation today patient appears to be doing excellent in regard to his wounds. In fact he appears to be completely healed today based on what I am seeing. This is excellent news and overall I am extremely pleased with where he stands. Overall the patient is happy to hear [name], [name] ([id number]) this as well this has been quite sometime coming. Objective Constitutional Well-nourished and well-hydrated in no acute distress. Vitals Time Taken: 10:04 AM, Height: 70 in, Weight: 262 lbs, BMI: 37.6, Temperature: 98 F, Pulse: 77 bpm, Respiratory Rate: 18 breaths/min, Blood Pressure: 131/72 mmHg. Respiratory normal breathing without difficulty. Psychiatric this patient is able to make  decisions and demonstrates good insight into disease process. Alert and Oriented x 3. pleasant and cooperative. General Notes: Upon evaluation patient's wound showed signs of complete epithelization and overall seem to be doing quite well and extremely pleased in that regard I do not see any evidence of anything worsening or infection at this point which is also great news. Integumentary (Hair, Skin) Wound #5 status is Healed - Epithelialized. Original cause of wound was Blister. The date acquired was: 02/02/2021. The wound has been in treatment 4 weeks. The wound is located on the Right,Distal,Midline Lower Leg. The wound measures 0cm length x 0cm width x 0cm depth; 0cm^2 area and 0cm^3 volume. There is no tunneling or undermining noted. There is a none present amount of drainage noted. There is no granulation within the  wound bed. There is no necrotic tissue within the wound bed. 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 Put garments on first thing when you wake up and remove them before bed. Moisturize legs daily after removing compression garments. 1. Would recommend that we discontinue wound care services as the patient is completely healed this is great news. 2. I am also can recommend he should be wearing compression especially on the right leg where this is just more recently closed. We will see the patient back for follow-up visit as needed. Electronic Signature(s) Signed: 03/06/2021 12:55:41 PM By: Derry Lory ([id number]) Entered By: Worthy Keeler on 03/06/2021 12:55:41 [name], [name] ([id number]) -------------------------------------------------------------------------------- SuperBill Details Patient Name: [name], [name]. Date of Service: 03/06/2021 Medical Record Number: [id number] Patient Account Number: [id number] Date of Birth/Sex: September 25, 1946 (75 y.o. 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 Z95.4 Presence of other heart-valve replacement Z79.01 Long term (current) use of anticoagulants L84 Corns and  callosities Facility Procedures CPT4 Code: [id number] Description: [phone] - WOUND CARE VISIT-LEV 2 EST PT Modifier: Quantity: 1 Physician Procedures CPT4 Code: [id number] Description: 51025 - WC PHYS LEVEL 3 - EST PT Modifier: Quantity: 1 CPT4 Code: Description: ICD-10 Diagnosis Description L03.115 Cellulitis of right lower limb L97.812 Non-pressure chronic ulcer of other part of right lower leg with fat la E11.622 Type 2 diabetes mellitus with other skin ulcer Z95.4 Presence of other heart-valve  replacement Modifier: yer exposed Quantity: Electronic Signature(s) Signed: 03/06/2021 12:56:10 PM By: Worthy Keeler PA-C Previous Signature: 03/06/2021 11:56:40 AM Version By: Georges Mouse, Minus Breeding RN Entered By: Worthy Keeler on 03/06/2021 12:56:10

## 2021-03-10 NOTE — Progress Notes (Signed)
[name], [name] ([id number]) Visit Report for 03/06/2021 Arrival Information Details Patient Name: [name], [name]. Date of Service: 03/06/2021 10:00 AM Medical Record Number: [id number] Patient Account Number: [id number] Date of Birth/Sex: Oct 11, 1946 (74 y.o. M) Treating RN: Carlene Coria Primary Care Newell Frater: Fulton Reek Other Clinician: Referring Gaylord Seydel: Fulton Reek Treating Datron Brakebill/Extender: Skipper Cliche in Treatment: 23 Visit Information History Since Last Visit All ordered tests and consults were completed: No Patient Arrived: [name] Added or deleted any medications: No Arrival Time: 09:53 Any new allergies or adverse reactions: No Accompanied By: self Had a fall or experienced change in No Transfer Assistance: None activities of daily living that may affect Patient Identification Verified: Yes risk of falls: Secondary Verification Process Completed: Yes Signs or symptoms of abuse/neglect since last visito No Patient Requires Transmission-Based No Hospitalized since last visit: No Precautions: Implantable device outside of the clinic excluding No Patient Has Alerts: Yes cellular tissue based products placed in the center Patient Alerts: Type II Diabetic since last visit: 81 mg Aspirin Has Dressing in Place as Prescribed: Yes Warfarin Pain Present Now: No ABI R1.09 TBI.88 07/19/20 ABI L1.12 TBI .94 07/19/20 Electronic Signature(s) Signed: 03/10/2021 2:18:56 PM By: Carlene Coria RN Entered By: Carlene Coria on 03/06/2021 10:04:10 [name] ([id number]) -------------------------------------------------------------------------------- Clinic Level of Care Assessment Details Patient Name: [name], [name]. Date of Service: 03/06/2021 10:00 AM Medical Record Number: [id number] Patient Account Number: [id number] Date of Birth/Sex: [birth year] (74 y.o. M) Treating RN: Dolan Amen Primary Care Zury Fazzino: Fulton Reek Other Clinician: Referring  Rollins Wrightson: Fulton Reek Treating Johnte Portnoy/Extender: Skipper Cliche in Treatment: 23 Clinic Level of Care Assessment Items TOOL 4 Quantity Score X - Use when only an EandM is performed on FOLLOW-UP visit 1 0 ASSESSMENTS - Nursing Assessment / Reassessment X - Reassessment of Co-morbidities (includes updates in patient status) 1 10 X- 1 5 Reassessment of Adherence to Treatment Plan ASSESSMENTS - Wound and Skin Assessment / Reassessment []  - Simple Wound Assessment / Reassessment - one wound 0 []  - 0 Complex Wound Assessment / Reassessment - multiple wounds []  - 0 Dermatologic / Skin Assessment (not related to wound area) ASSESSMENTS - Focused Assessment []  - Circumferential Edema Measurements - multi extremities 0 []  - 0 Nutritional Assessment / Counseling / Intervention []  - 0 Lower Extremity Assessment (monofilament, tuning fork, pulses) []  - 0 Peripheral Arterial Disease Assessment (using hand held doppler) ASSESSMENTS - Ostomy and/or Continence Assessment and Care []  - Incontinence Assessment and Management 0 []  - 0 Ostomy Care Assessment and Management (repouching, etc.) PROCESS - Coordination of Care X - Simple Patient / Family Education for ongoing care 1 15 []  - 0 Complex (extensive) Patient / Family Education for ongoing care []  - 0 Staff obtains Programmer, systems, Records, Test Results / Process Orders []  - 0 Staff telephones HHA, Nursing Homes / Clarify orders / etc []  - 0 Routine Transfer to another Facility (non-emergent condition) []  - 0 Routine Hospital Admission (non-emergent condition) []  - 0 New Admissions / Biomedical engineer / Ordering NPWT, Apligraf, etc. []  - 0 Emergency Hospital Admission (emergent condition) X- 1 10 Simple Discharge Coordination []  - 0 Complex (extensive) Discharge Coordination PROCESS - Special Needs []  - Pediatric / Minor Patient Management 0 []  - 0 Isolation Patient Management []  - 0 Hearing / Language / Visual special  needs []  - 0 Assessment of Community assistance (transportation, D/C planning, etc.) []  - 0 Additional assistance / Altered mentation []  - 0 Support Surface(s) Assessment (bed, cushion, seat, etc.)  INTERVENTIONS - Wound Cleansing / Measurement [name], [name] ([id number]) []  - 0 Simple Wound Cleansing - one wound []  - 0 Complex Wound Cleansing - multiple wounds []  - 0 Wound Imaging (photographs - any number of wounds) []  - 0 Wound Tracing (instead of photographs) []  - 0 Simple Wound Measurement - one wound []  - 0 Complex Wound Measurement - multiple wounds INTERVENTIONS - Wound Dressings []  - Small Wound Dressing one or multiple wounds 0 []  - 0 Medium Wound Dressing one or multiple wounds []  - 0 Large Wound Dressing one or multiple wounds []  - 0 Application of Medications - topical []  - 0 Application of Medications - injection INTERVENTIONS - Miscellaneous []  - External ear exam 0 []  - 0 Specimen Collection (cultures, biopsies, blood, body fluids, etc.) []  - 0 Specimen(s) / Culture(s) sent or taken to Lab for analysis []  - 0 Patient Transfer (multiple staff / Civil Service fast streamer / Similar devices) []  - 0 Simple Staple / Suture removal (25 or less) []  - 0 Complex Staple / Suture removal (26 or more) []  - 0 Hypo / Hyperglycemic Management (close monitor of Blood Glucose) []  - 0 Ankle / Brachial Index (ABI) - do not check if billed separately X- 1 5 Vital Signs Has the patient been seen at the hospital within the last three years: Yes Total Score: 45 Level Of Care: New/Established - Level 2 Electronic Signature(s) Signed: 03/06/2021 11:56:40 AM By: [name], [name] Breeding RN Entered By: [name], [name] on 03/06/2021 10:17:26 [name] ([id number]) -------------------------------------------------------------------------------- Encounter Discharge Information Details Patient Name: [name], [name]. Date of Service: 03/06/2021 10:00 AM Medical Record  Number: [id number] Patient Account Number: [id number] Date of Birth/Sex: 1946/08/12 (74 y.o. M) Treating RN: Dolan Amen Primary Care Vernona Peake: Fulton Reek Other Clinician: Referring Kennetta Pavlovic: Fulton Reek Treating [name]/Extender: Skipper Cliche in Treatment: 23 Encounter Discharge Information Items Discharge Condition: Stable Ambulatory Status: Cane Discharge Destination: Home Transportation: Private Auto Accompanied By: self Schedule Follow-up Appointment: No Clinical Summary of Care: Electronic Signature(s) Signed: 03/06/2021 11:56:40 AM By: [name], [name] Breeding RN Entered By: [name], [name] on 03/06/2021 10:18:44 [name] ([id number]) -------------------------------------------------------------------------------- Lower Extremity Assessment Details Patient Name: SEELEY, HISSONG. Date of Service: 03/06/2021 10:00 AM Medical Record Number: [id number] Patient Account Number: [id number] Date of Birth/Sex: 1946/05/25 (74 y.o. M) Treating RN: Carlene Coria Primary Care Averie Meiner: Fulton Reek Other Clinician: Referring Bejamin Hackbart: Fulton Reek Treating Baileigh Modisette/Extender: Skipper Cliche in Treatment: 23 Edema Assessment Assessed: [Left: No] [Right: No] [Left: Edema] [Right: :] Calf Left: Right: Point of Measurement: 34 cm From Medial Instep 38.3 cm Ankle Left: Right: Point of Measurement: 12 cm From Medial Instep 24 cm Vascular Assessment Pulses: Dorsalis Pedis Palpable: [Right:Yes] Electronic Signature(s) Signed: 03/10/2021 2:18:56 PM By: Carlene Coria RN Entered By: Carlene Coria on 03/06/2021 10:06:04 [name] ([id number]) -------------------------------------------------------------------------------- Multi Wound Chart Details Patient Name: [name]. Date of Service: 03/06/2021 10:00 AM Medical Record Number: [id number] Patient Account Number: [id number] Date of Birth/Sex: [birth year] (74 y.o. M) Treating RN:  Dolan Amen Primary Care Camil Wilhelmsen: Fulton Reek Other Clinician: Referring Finnlee Guarnieri: Fulton Reek Treating Kery Haltiwanger/Extender: Skipper Cliche in Treatment: 23 Vital Signs Height(in): 70 Pulse(bpm): 45 Weight(lbs): 41 Blood Pressure(mmHg): 131/72 Body Mass Index(BMI): 38 Temperature(F): 98 Respiratory Rate(breaths/min): 18 Photos: [N/A:N/A] Wound Location: Right, Distal, Midline Lower Leg N/A N/A Wounding Event: Blister N/A N/A Primary Etiology: Diabetic Wound/Ulcer of the Lower N/A N/A Extremity Comorbid History: Cataracts, Arrhythmia, Coronary N/A N/A Artery Disease, Hypertension, Type II Diabetes,  Osteoarthritis, Neuropathy Date Acquired: 02/02/2021 N/A N/A Weeks of Treatment: 4 N/A N/A Wound Status: Open N/A N/A Measurements L x W x D (cm) 0x0x0 N/A N/A Area (cm) : 0 N/A N/A Volume (cm) : 0 N/A N/A % Reduction in Area: 100.00% N/A N/A % Reduction in Volume: 100.00% N/A N/A Classification: Grade 1 N/A N/A Exudate Amount: None Present N/A N/A Granulation Amount: None Present (0%) N/A N/A Necrotic Amount: None Present (0%) N/A N/A Exposed Structures: Fascia: No N/A N/A Fat Layer (Subcutaneous Tissue): No Tendon: No Muscle: No Joint: No Bone: No Epithelialization: Large (67-100%) N/A N/A Treatment Notes Electronic Signature(s) Signed: 03/06/2021 11:56:40 AM By: [name], [name] Breeding RN Entered By: [name], [name] on 03/06/2021 10:15:56 [name] ([id number]) -------------------------------------------------------------------------------- Multi-Disciplinary Care Plan Details Patient Name: ASHBY, MOSKAL. Date of Service: 03/06/2021 10:00 AM Medical Record Number: [id number] Patient Account Number: [id number] Date of Birth/Sex: Jan 03, 1946 (74 y.o. M) Treating RN: Dolan Amen Primary Care Frederich Montilla: Fulton Reek Other Clinician: Referring Topher Buenaventura: Fulton Reek Treating Rifky Lapre/Extender: Skipper Cliche in Treatment:  23 Active Inactive Electronic Signature(s) Signed: 03/06/2021 11:56:40 AM By: [name], [name] Breeding RN Entered By: [name], [name] on 03/06/2021 10:15:46 [name] ([id number]) -------------------------------------------------------------------------------- Pain Assessment Details Patient Name: PIERO, MUSTARD. Date of Service: 03/06/2021 10:00 AM Medical Record Number: [id number] Patient Account Number: [id number] Date of Birth/Sex: 12-19-1945 (74 y.o. M) Treating RN: Carlene Coria Primary Care Mase Dhondt: Fulton Reek Other Clinician: Referring Krystian Ferrentino: Fulton Reek Treating Vlada Uriostegui/Extender: Skipper Cliche in Treatment: 23 Active Problems Location of Pain Severity and Description of Pain Patient Has Paino No Site Locations Pain Management and Medication Current Pain Management: Electronic Signature(s) Signed: 03/10/2021 2:18:56 PM By: Carlene Coria RN Entered By: Carlene Coria on 03/06/2021 10:04:40 [name] ([id number]) -------------------------------------------------------------------------------- Patient/Caregiver Education Details Patient Name: SYLVIA, KONDRACKI. Date of Service: 03/06/2021 10:00 AM Medical Record Number: [id number] Patient Account Number: [id number] Date of Birth/Gender: [birth year] (74 y.o. M) Treating RN: Dolan Amen Primary Care Physician: Fulton Reek Other Clinician: Referring Physician: Fulton Reek Treating Physician/Extender: Skipper Cliche in Treatment: 23 Education Assessment Education Provided To: Patient Education Topics Provided Wound/Skin Impairment: Methods: Explain/Verbal Responses: State content correctly Electronic Signature(s) Signed: 03/06/2021 11:56:40 AM By: [name], [name] Breeding RN Entered By: [name], [name] on 03/06/2021 10:18:25 [name] ([id number]) -------------------------------------------------------------------------------- Wound Assessment Details Patient  Name: KELVYN, SCHUNK. Date of Service: 03/06/2021 10:00 AM Medical Record Number: [id number] Patient Account Number: [id number] Date of Birth/Sex: 11-08-1945 (74 y.o. M) Treating RN: Carlene Coria Primary Care Wynelle Dreier: Fulton Reek Other Clinician: Referring Kevin Mario: Fulton Reek Treating Lyah Millirons/Extender: Skipper Cliche in Treatment: 23 Wound Status Wound Number: 5 Primary Diabetic Wound/Ulcer of the Lower Extremity Etiology: Wound Location: Right, Distal, Midline Lower Leg Wound Healed - Epithelialized Wounding Event: Blister Status: Date Acquired: 02/02/2021 Comorbid Cataracts, Arrhythmia, Coronary Artery Disease, Weeks Of Treatment: 4 History: Hypertension, Type II Diabetes, Osteoarthritis, Clustered Wound: No Neuropathy Photos Wound Measurements Length: (cm) 0 Width: (cm) 0 Depth: (cm) 0 Area: (cm) 0 Volume: (cm) 0 % Reduction in Area: 100% % Reduction in Volume: 100% Epithelialization: Large (67-100%) Tunneling: No Undermining: No Wound Description Classification: Grade 1 Exudate Amount: None Present Foul Odor After Cleansing: No Slough/Fibrino No Wound Bed Granulation Amount: None Present (0%) Exposed Structure Necrotic Amount: None Present (0%) Fascia Exposed: No Fat Layer (Subcutaneous Tissue) Exposed: No Tendon Exposed: No Muscle Exposed: No Joint Exposed: No Bone Exposed: No Electronic Signature(s) Signed: 03/06/2021 11:56:40 AM By: Charlett Nose RN  Signed: 03/10/2021 2:18:56 PM By: Carlene Coria RN Entered By: [name], [name] on 03/06/2021 10:16:34 [name] ([id number]) -------------------------------------------------------------------------------- Vitals Details Patient Name: COPELAN, MAULTSBY. Date of Service: 03/06/2021 10:00 AM Medical Record Number: [id number] Patient Account Number: [id number] Date of Birth/Sex: 1946/06/26 (74 y.o. M) Treating RN: Carlene Coria Primary Care Francoise Chojnowski: Fulton Reek Other  Clinician: Referring Dawsyn Ramsaran: Fulton Reek Treating Brodrick Curran/Extender: Skipper Cliche in Treatment: 23 Vital Signs Time Taken: 10:04 Temperature (F): 98 Height (in): 70 Pulse (bpm): 77 Weight (lbs): 262 Respiratory Rate (breaths/min): 18 Body Mass Index (BMI): 37.6 Blood Pressure (mmHg): 131/72 Reference Range: 80 - 120 mg / dl Electronic Signature(s) Signed: 03/10/2021 2:18:56 PM By: Carlene Coria RN Entered By: Carlene Coria on 03/06/2021 10:04:35

## 2021-03-13 ENCOUNTER — Encounter: Payer: Medicare HMO | Admitting: Internal Medicine

## 2021-04-01 ENCOUNTER — Encounter: Payer: Medicare HMO | Attending: Physician Assistant | Admitting: Physician Assistant

## 2021-04-01 ENCOUNTER — Other Ambulatory Visit: Payer: Self-pay

## 2021-04-01 DIAGNOSIS — E11622 Type 2 diabetes mellitus with other skin ulcer: Secondary | ICD-10-CM | POA: Insufficient documentation

## 2021-04-01 DIAGNOSIS — I890 Lymphedema, not elsewhere classified: Secondary | ICD-10-CM | POA: Insufficient documentation

## 2021-04-01 DIAGNOSIS — Z7901 Long term (current) use of anticoagulants: Secondary | ICD-10-CM | POA: Insufficient documentation

## 2021-04-01 DIAGNOSIS — L03115 Cellulitis of right lower limb: Secondary | ICD-10-CM | POA: Insufficient documentation

## 2021-04-01 DIAGNOSIS — E1140 Type 2 diabetes mellitus with diabetic neuropathy, unspecified: Secondary | ICD-10-CM | POA: Diagnosis not present

## 2021-04-01 DIAGNOSIS — L97812 Non-pressure chronic ulcer of other part of right lower leg with fat layer exposed: Secondary | ICD-10-CM | POA: Diagnosis not present

## 2021-04-01 DIAGNOSIS — Z952 Presence of prosthetic heart valve: Secondary | ICD-10-CM | POA: Diagnosis not present

## 2021-04-01 DIAGNOSIS — L97822 Non-pressure chronic ulcer of other part of left lower leg with fat layer exposed: Secondary | ICD-10-CM | POA: Diagnosis not present

## 2021-04-01 NOTE — Progress Notes (Signed)
[name], [name] ([id number]) Visit Report for 04/01/2021 Arrival Information Details Patient Name: [name], [name]. Date of Service: 04/01/2021 9:30 AM Medical Record Number: [id number] Patient Account Number: [id number] Date of Birth/Sex: [birth year] (75 y.o. M) Treating RN: Donnamarie Poag Primary Care Meshulem Onorato: Fulton Reek Other Clinician: Referring Lorretta Kerce: Fulton Reek Treating Castiel Lauricella/Extender: Skipper Cliche in Treatment: 27 Visit Information History Since Last Visit Pain Present Now: No Patient Arrived: Cane Arrival Time: 09:33 Accompanied By: self Transfer Assistance: None Patient Requires Transmission-Based No Precautions: Patient Has Alerts: Yes Patient Alerts: Patient on Blood Thinner Type II Diabetic 81 mg Aspirin Warfarin ABI R1.09 TBI.88 07/19/20 ABI L1.12 TBI .94 07/19/20 COUMADIN Electronic Signature(s) Signed: 04/01/2021 4:29:31 PM By: Donnamarie Poag Entered By: Donnamarie Poag on 04/01/2021 [place] [name] ([id number]) -------------------------------------------------------------------------------- Compression Therapy Details Patient Name: [name], [name]. Date of Service: 04/01/2021 9:30 AM Medical Record Number: [id number] Patient Account Number: [id number] Date of Birth/Sex: 1946-09-20 (75 y.o. M) Treating RN: [name] Primary Care Brittay Mogle: Fulton Reek Other Clinician: Referring Serenna Deroy: Fulton Reek Treating Ayansh Feutz/Extender: Skipper Cliche in Treatment: 27 Compression Therapy Performed for Wound Assessment: Wound #6 Right,Midline Lower Leg Performed By: Clinician [name], RN Compression Type: Three Layer Pre Treatment ABI: 1.1 Post Procedure Diagnosis Same as Pre-procedure Electronic Signature(s) Signed: 04/01/2021 3:46:32 PM By: [name], BSN, RN, CWS, [name] RN, BSN Entered By: [name], BSN, RN, CWS, [name] on 04/01/2021 10:07:56 [name]  ([id number]) -------------------------------------------------------------------------------- Compression Therapy Details Patient Name: [name], [name]. Date of Service: 04/01/2021 9:30 AM Medical Record Number: [id number] Patient Account Number: [id number] Date of Birth/Sex: 06/02/1946 (75 y.o. M) Treating RN: [name] Primary Care Jaiana Sheffer: Fulton Reek Other Clinician: Referring Cian Costanzo: Fulton Reek Treating Janika Jedlicka/Extender: Skipper Cliche in Treatment: 27 Compression Therapy Performed for Wound Assessment: Wound #7 Left,Distal,Midline Lower Leg Performed By: Clinician [name], RN Compression Type: Three Layer Pre Treatment ABI: 1.2 Post Procedure Diagnosis Same as Pre-procedure Electronic Signature(s) Signed: 04/01/2021 3:46:32 PM By: [name], BSN, RN, CWS, [name] RN, BSN Entered By: [name], BSN, RN, CWS, [name] on 04/01/2021 10:08:25 [name] ([id number]) -------------------------------------------------------------------------------- Encounter Discharge Information Details Patient Name: [name], [name]. Date of Service: 04/01/2021 9:30 AM Medical Record Number: [id number] Patient Account Number: [id number] Date of Birth/Sex: 12-23-1945 (75 y.o. M) Treating RN: Donnamarie Poag Primary Care Keyandra Swenson: Fulton Reek Other Clinician: Referring Carnell Casamento: Fulton Reek Treating Patsy Zaragoza/Extender: Skipper Cliche in Treatment: 27 Encounter Discharge Information Items Discharge Condition: Stable Ambulatory Status: Cane Discharge Destination: Home Transportation: Private Auto Accompanied By: self Schedule Follow-up Appointment: Yes Clinical Summary of Care: Electronic Signature(s) Signed: 04/01/2021 4:29:31 PM By: Donnamarie Poag Entered By: Donnamarie Poag on 04/01/2021 10:29:38 [name] ([id number]) -------------------------------------------------------------------------------- Lower Extremity Assessment Details Patient Name: [name], [name]. Date of  Service: 04/01/2021 9:30 AM Medical Record Number: [id number] Patient Account Number: [id number] Date of Birth/Sex: Feb 09, 1946 (75 y.o. M) Treating RN: Donnamarie Poag Primary Care Vianka Ertel: Fulton Reek Other Clinician: Referring Ticia Virgo: Fulton Reek Treating Sury Wentworth/Extender: Skipper Cliche in Treatment: 27 Edema Assessment Assessed: [Left: Yes] Patrice Paradise: Yes] Edema: [Left: Yes] [Right: Yes] Calf Left: Right: Point of Measurement: 34 cm From Medial Instep 42 cm 42 cm Ankle Left: Right: Point of Measurement: 12 cm From Medial Instep 23.8 cm 24.8 cm Knee To Floor Left: Right: From Medial Instep 45 cm 45 cm Vascular Assessment Pulses: Dorsalis Pedis Doppler Audible: [Left:Yes] [Right:Yes] Electronic Signature(s) Signed: 04/01/2021 4:29:31 PM By: Donnamarie Poag Entered By: Donnamarie Poag on 04/01/2021 09:44:57 [name] ([id number]) -------------------------------------------------------------------------------- Multi  Wound Chart Details Patient Name: [name], [name] J. Date of Service: 04/01/2021 9:30 AM Medical Record Number: [id number] Patient Account Number: [id number] Date of Birth/Sex: 03/17/1946 (74 y.o. M) Treating RN: Woody, [name] Primary Care : Sparks, Jeffrey Other Clinician: Referring : Sparks, Jeffrey Treating /Extender: Stone, Hoyt Weeks in Treatment: 27 Vital Signs Height(in): 70 Pulse(bpm): 85 Weight(lbs): 262 Blood Pressure(mmHg): 107/66 Body Mass Index(BMI): 38 Temperature(°F): 98.6 Respiratory Rate(breaths/min): 18 Photos: Wound Location: Right, Midline Lower Leg Left, Distal, Midline Lower Leg Left, Lateral Lower Leg Wounding Event: Gradually Appeared Gradually Appeared Gradually Appeared Primary Etiology: Lymphedema Lymphedema Venous Leg Ulcer Comorbid History: Cataracts, Arrhythmia, Coronary Cataracts, Arrhythmia, Coronary Cataracts, Arrhythmia, Coronary Artery Disease, Hypertension, Type Artery Disease, Hypertension,  Type Artery Disease, Hypertension, Type II Diabetes, Osteoarthritis, II Diabetes, Osteoarthritis, II Diabetes, Osteoarthritis, Neuropathy Neuropathy Neuropathy Date Acquired: 03/24/2021 03/24/2021 03/24/2021 Weeks of Treatment: 0 0 0 Wound Status: Open Open Open Clustered Wound: Yes No No Measurements L x W x D (cm) 12x10x0.1 2x2x0.1 0.7x2.2x0.1 Area (cm) : 94.248 3.142 1.21 Volume (cm) : 9.425 0.314 0.121 Classification: Full Thickness Without Exposed Full Thickness Without Exposed Full Thickness Without Exposed Support Structures Support Structures Support Structures Exudate Amount: Medium Medium Large Exudate Type: Serosanguineous Serosanguineous Serous Exudate Color: red, brown red, brown amber Granulation Amount: Small (1-33%) Medium (34-66%) Medium (34-66%) Granulation Quality: Red, Pink N/A Red Necrotic Amount: Large (67-100%) Medium (34-66%) Medium (34-66%) Necrotic Tissue: Eschar, Adherent Slough Eschar, Adherent Slough N/A Exposed Structures: Fat Layer (Subcutaneous Tissue): Fat Layer (Subcutaneous Tissue): Fat Layer (Subcutaneous Tissue): Yes Yes Yes Fascia: No Fascia: No Fascia: No Tendon: No Tendon: No Tendon: No Muscle: No Muscle: No Muscle: No Joint: No Joint: No Joint: No Bone: No Bone: No Bone: No Epithelialization: N/A N/A None Treatment Notes Electronic Signature(s) Signed: 04/01/2021 3:46:32 PM By: Woody, BSN, RN, CWS, [name] RN, BSN Entered By: Woody, BSN, RN, CWS, [name] on 04/01/2021 10:07:23 Seigler, Linken J. ([id number]) -------------------------------------------------------------------------------- Multi-Disciplinary Care Plan Details Patient Name: [name], [name] J. Date of Service: 04/01/2021 9:30 AM Medical Record Number: [id number] Patient Account Number: [id number] Date of Birth/Sex: 01/10/1946 (74 y.o. M) Treating RN: Woody, [name] Primary Care : Sparks, Jeffrey Other Clinician: Referring : Sparks, Jeffrey Treating  /Extender: Stone, Hoyt Weeks in Treatment: 27 Active Inactive Wound/Skin Impairment Nursing Diagnoses: Impaired tissue integrity Goals: Ulcer/skin breakdown will have a volume reduction of 30% by week 4 Date Initiated: 09/24/2020 Target Resolution Date: 10/25/2020 Goal Status: Active Ulcer/skin breakdown will have a volume reduction of 50% by week 8 Date Initiated: 09/24/2020 Date Inactivated: 12/10/2020 Target Resolution Date: 11/25/2020 Goal Status: Met Ulcer/skin breakdown will have a volume reduction of 80% by week 12 Date Initiated: 09/24/2020 Date Inactivated: 12/26/2020 Target Resolution Date: 12/23/2020 Goal Status: Met Ulcer/skin breakdown will heal within 14 weeks Date Initiated: 09/24/2020 Date Inactivated: 12/26/2020 Target Resolution Date: 01/13/2021 Goal Status: Met Interventions: Assess patient/caregiver ability to obtain necessary supplies Assess patient/caregiver ability to perform ulcer/skin care regimen upon admission and as needed Treatment Activities: Referred to DME  for dressing supplies : 09/24/2020 Skin care regimen initiated : 09/24/2020 Topical wound management initiated : 09/24/2020 Notes: Electronic Signature(s) Signed: 04/01/2021 3:46:32 PM By: Woody, BSN, RN, CWS, [name] RN, BSN Entered By: Woody, BSN, RN, CWS, [name] on 04/01/2021 10:07:16 [name], [name] J. ([id number]) -------------------------------------------------------------------------------- Pain Assessment Details Patient Name: [name], [name] J. Date of Service: 04/01/2021 9:30 AM Medical Record Number: [id number] Patient Account Number: [id number] Date of Birth/Sex: 07/26/1946 (74 y.o. M) Treating RN: Bishop, Joy Primary Care : Sparks, Jeffrey Other   Clinician: Referring Dastan Krider: Fulton Reek Treating Kindred Heying/Extender: Skipper Cliche in Treatment: 27 Active Problems Location of Pain Severity and Description of Pain Patient Has Paino No Site Locations Rate the  pain. Current Pain Level: 0 Pain Management and Medication Current Pain Management: Electronic Signature(s) Signed: 04/01/2021 4:29:31 PM By: Donnamarie Poag Entered By: Donnamarie Poag on 04/01/2021 09:40:25 [name] ([id number]) -------------------------------------------------------------------------------- Patient/Caregiver Education Details Patient Name: [name], SZABO. Date of Service: 04/01/2021 9:30 AM Medical Record Number: [id number] Patient Account Number: [id number] Date of Birth/Gender: 1946/10/06 (75 y.o. M) Treating RN: [name] Primary Care Physician: Fulton Reek Other Clinician: Referring Physician: Fulton Reek Treating Physician/Extender: Skipper Cliche in Treatment: 25 Education Assessment Education Provided To: Patient Education Topics Provided Venous: Handouts: Controlling Swelling with Multilayered Compression Wraps Methods: Demonstration, Explain/Verbal Responses: State content correctly Welcome To [facility]: Handouts: Welcome To [facility] [place] Methods: Demonstration, Explain/Verbal Responses: State content correctly Electronic Signature(s) Signed: 04/01/2021 3:46:32 PM By: [name], BSN, RN, CWS, [name] RN, BSN Entered By: [name], BSN, RN, CWS, [name] on 04/01/2021 10:11:22 [name] ([id number]) -------------------------------------------------------------------------------- Wound Assessment Details Patient Name: [name], PALMA. Date of Service: 04/01/2021 9:30 AM Medical Record Number: [id number] Patient Account Number: [id number] Date of Birth/Sex: [birth year] (75 y.o. M) Treating RN: Donnamarie Poag Primary Care Abdallah Hern: Fulton Reek Other Clinician: Referring Glayds Insco: Fulton Reek Treating Shardee Dieu/Extender: Skipper Cliche in Treatment: 27 Wound Status Wound Number: 6 Primary Lymphedema Etiology: Wound Location: Right, Midline Lower Leg Wound Open Wounding Event: Gradually Appeared Status: Date  Acquired: 03/24/2021 Comorbid Cataracts, Arrhythmia, Coronary Artery Disease, Weeks Of Treatment: 0 History: Hypertension, Type II Diabetes, Osteoarthritis, Clustered Wound: Yes Neuropathy Photos Wound Measurements Length: (cm) 12 Width: (cm) 10 Depth: (cm) 0.1 Area: (cm) 94.248 Volume: (cm) 9.425 % Reduction in Area: % Reduction in Volume: Tunneling: No Undermining: No Wound Description Classification: Full Thickness Without Exposed Support Structu Exudate Amount: Medium Exudate Type: Serosanguineous Exudate Color: red, brown res Foul Odor After Cleansing: No Slough/Fibrino Yes Wound Bed Granulation Amount: Small (1-33%) Exposed Structure Granulation Quality: Red, Pink Fascia Exposed: No Necrotic Amount: Large (67-100%) Fat Layer (Subcutaneous Tissue) Exposed: Yes Necrotic Quality: Eschar, Adherent Slough Tendon Exposed: No Muscle Exposed: No Joint Exposed: No Bone Exposed: No Treatment Notes Wound #6 (Lower Leg) Wound Laterality: Right, Midline Cleanser Peri-Wound Care Topical Primary Dressing GREGOIRE, BENNIS ([id number]) Silvercel Small 2x2 (in/in) Discharge Instruction: Apply Silvercel Small 2x2 (in/in) as instructed Secondary Dressing ABD Pad 5x9 (in/in) Discharge Instruction: Cover with ABD pad Secured With Compression Wrap Profore Lite LF 3 Multilayer Compression Bandaging System Discharge Instruction: Apply 3 multi-layer wrap as prescribed. Compression Stockings Add-Ons Electronic Signature(s) Signed: 04/01/2021 4:29:31 PM By: Donnamarie Poag Entered By: Donnamarie Poag on 04/01/2021 09:49:27 [name] ([id number]) -------------------------------------------------------------------------------- Wound Assessment Details Patient Name: DYER, KLUG. Date of Service: 04/01/2021 9:30 AM Medical Record Number: [id number] Patient Account Number: [id number] Date of Birth/Sex: [birth year] (75 y.o. M) Treating RN: Donnamarie Poag Primary Care Samon Dishner:  Fulton Reek Other Clinician: Referring Gemayel Mascio: Fulton Reek Treating Creedence Heiss/Extender: Skipper Cliche in Treatment: 27 Wound Status Wound Number: 7 Primary Lymphedema Etiology: Wound Location: Left, Distal, Midline Lower Leg Wound Open Wounding Event: Gradually Appeared Status: Date Acquired: 03/24/2021 Comorbid Cataracts, Arrhythmia, Coronary Artery Disease, Weeks Of Treatment: 0 History: Hypertension, Type II Diabetes, Osteoarthritis, Clustered Wound: No Neuropathy Photos Wound Measurements Length: (cm) 2 Width: (cm) 2 Depth: (cm) 0.1 Area: (cm) 3.142 Volume: (cm) 0.314 % Reduction in Area: % Reduction in Volume: Tunneling:  No Undermining: No Wound Description Classification: Full Thickness Without Exposed Support Structu Exudate Amount: Medium Exudate Type: Serosanguineous Exudate Color: red, brown res Foul Odor After Cleansing: No Slough/Fibrino Yes Wound Bed Granulation Amount: Medium (34-66%) Exposed Structure Necrotic Amount: Medium (34-66%) Fascia Exposed: No Necrotic Quality: Eschar, Adherent Slough Fat Layer (Subcutaneous Tissue) Exposed: Yes Tendon Exposed: No Muscle Exposed: No Joint Exposed: No Bone Exposed: No Treatment Notes Wound #7 (Lower Leg) Wound Laterality: Left, Midline, Distal Cleanser Peri-Wound Care Topical Primary Dressing RODRIGUES, URBANEK ([id number]) Silvercel Small 2x2 (in/in) Discharge Instruction: Apply Silvercel Small 2x2 (in/in) as instructed Secondary Dressing ABD Pad 5x9 (in/in) Discharge Instruction: Cover with ABD pad Secured With Compression Wrap Profore Lite LF 3 Multilayer Compression Bandaging System Discharge Instruction: Apply 3 multi-layer wrap as prescribed. Compression Stockings Add-Ons Electronic Signature(s) Signed: 04/01/2021 4:29:31 PM By: Donnamarie Poag Entered By: Donnamarie Poag on 04/01/2021 09:51:00 [name]  ([id number]) -------------------------------------------------------------------------------- Wound Assessment Details Patient Name: SHAHEEN, MENDE. Date of Service: 04/01/2021 9:30 AM Medical Record Number: [id number] Patient Account Number: [id number] Date of Birth/Sex: 1946-05-12 (75 y.o. M) Treating RN: [name] Primary Care Gianpaolo Mindel: Fulton Reek Other Clinician: Referring Cookie Pore: Fulton Reek Treating Makalya Nave/Extender: Skipper Cliche in Treatment: 27 Wound Status Wound Number: 8 Primary Venous Leg Ulcer Etiology: Wound Location: Left, Lateral Lower Leg Wound Open Wounding Event: Gradually Appeared Status: Date Acquired: 03/24/2021 Comorbid Cataracts, Arrhythmia, Coronary Artery Disease, Weeks Of Treatment: 0 History: Hypertension, Type II Diabetes, Osteoarthritis, Clustered Wound: No Neuropathy Photos Wound Measurements Length: (cm) 0.7 Width: (cm) 2.2 Depth: (cm) 0.1 Area: (cm) 1.21 Volume: (cm) 0.121 % Reduction in Area: % Reduction in Volume: Epithelialization: None Tunneling: No Undermining: No Wound Description Classification: Full Thickness Without Exposed Support Structu Exudate Amount: Large Exudate Type: Serous Exudate Color: amber res Foul Odor After Cleansing: No Slough/Fibrino No Wound Bed Granulation Amount: Medium (34-66%) Exposed Structure Granulation Quality: Red Fascia Exposed: No Necrotic Amount: Medium (34-66%) Fat Layer (Subcutaneous Tissue) Exposed: Yes Tendon Exposed: No Muscle Exposed: No Joint Exposed: No Bone Exposed: No Treatment Notes Wound #8 (Lower Leg) Wound Laterality: Left, Lateral Cleanser Peri-Wound Care Topical Primary Dressing KAEDEN, MESTER ([id number]) Silvercel Small 2x2 (in/in) Discharge Instruction: Apply Silvercel Small 2x2 (in/in) as instructed Secondary Dressing ABD Pad 5x9 (in/in) Discharge Instruction: Cover with ABD pad Secured With Compression Wrap Profore Lite LF 3 Multilayer  Compression Bandaging System Discharge Instruction: Apply 3 multi-layer wrap as prescribed. Compression Stockings Environmental education officer) Signed: 04/01/2021 3:46:32 PM By: [name], BSN, RN, CWS, [name] RN, BSN Entered By: [name], BSN, RN, CWS, [name] on 04/01/2021 10:06:34 [name] ([id number]) -------------------------------------------------------------------------------- Vitals Details Patient Name: NEEV, MCMAINS. Date of Service: 04/01/2021 9:30 AM Medical Record Number: [id number] Patient Account Number: [id number] Date of Birth/Sex: 1946-04-19 (75 y.o. M) Treating RN: Donnamarie Poag Primary Care Baleria Wyman: Fulton Reek Other Clinician: Referring Harpreet Pompey: Fulton Reek Treating Rim Thatch/Extender: Skipper Cliche in Treatment: 27 Vital Signs Time Taken: 09:36 Temperature (F): 98.6 Height (in): 70 Pulse (bpm): 85 Weight (lbs): 262 Respiratory Rate (breaths/min): 18 Body Mass Index (BMI): 37.6 Blood Pressure (mmHg): 107/66 Reference Range: 80 - 120 mg / dl Electronic Signature(s) Signed: 04/01/2021 4:29:31 PM By: Donnamarie Poag Entered ByDonnamarie Poag on 04/01/2021 09:40:18

## 2021-04-01 NOTE — Progress Notes (Addendum)
HAJI, DELAINE ([id number]) Visit Report for 04/01/2021 Chief Complaint Document Details Patient Name: TIRAS, BIANCHINI. Date of Service: 04/01/2021 9:30 AM Medical Record Number: [id number] Patient Account Number: [id number] Date of Birth/Sex: [birth year] (75 y.o. M) Treating RN: Dolan Amen Primary Care Provider: Fulton Reek Other Clinician: Referring Provider: Fulton Reek Treating Provider/Extender: Skipper Cliche in Treatment: 27 Information Obtained from: Patient Chief Complaint Right LE ulcers and cellulitis Electronic Signature(s) Signed: 04/01/2021 9:59:30 AM By: Worthy Keeler PA-C Entered By: Worthy Keeler on 04/01/2021 09:59:30 [name] ([id number]) -------------------------------------------------------------------------------- HPI Details Patient Name: JET, ARMBRUST. Date of Service: 04/01/2021 9:30 AM Medical Record Number: [id number] Patient Account Number: [id number] Date of Birth/Sex: 07/16/1946 (75 y.o. M) Treating RN: Dolan Amen Primary Care Provider: Fulton Reek Other Clinician: Referring Provider: Fulton Reek Treating Provider/Extender: Skipper Cliche in Treatment: 27 History of Present Illness HPI Description: 09/24/2020 on evaluation today patient presents today for a heel ulcer that he tells me has been present for about 2 years. He has been seeing podiatry and they have been attempting to manage this including what sounds to be a total contact cast, Unna boot, and just standard dressings otherwise as well. Most recently has been using triple antibiotic ointment. With that being said unfortunately despite everything he really has not had any significant improvement. He tells me that he cannot even really remember exactly how this began but he presumed it may have rubbed on his shoes or something of that nature. 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 He tells me that this does seem to help. Fortunately there is no signs of active infection at this time. His most recent hemoglobin A1c was 8.1 though he knows this was this year he cannot tell me the exact time. His fluid pills currently to help with some of the lower extremity edema although he does obviously have signs of venous stasis/lymphedema. Currently there is no evidence of active infection. No fevers, chills, nausea, vomiting, or diarrhea. Patient has had fairly recent ABIs which were performed on 07/19/2020 and revealed that he has normal findings in both the ankle and toe locations bilaterally. His ABI on the right was 1.09 on the left was 1.08 with a TBI on the right of 0.88 and on the left of 0.94. Triphasic flow was noted throughout. 10/08/2020 on evaluation today patient appears to be doing pretty well in regard to his left heel currently in fact this is doing a great job and seems to be healing quite nicely. Unfortunately on his right leg he had a pile of wood that actually fell on him injuring his right leg this is somewhat erythematous has me concerned little bit about cellulitis though there is not really a good area to culture at this point. 10/24/2020 upon evaluation today patient appears to be doing well with regard to his heel ulcer. He is showing signs of improvement which is great news. His right leg is completely healed. Overall I feel like he is doing excellent and there is no signs of infection. 11/07/2020 upon evaluation today patient appears to be doing well with regard to his heel ulcer. He tells me that last week when he was unable to  come in his wife actually thought that the wound was very close to closing if not closed. Then it began to "reopen again". I  really feel like what may have happened as the collagen may have dried over the wound bed and that because that misunderstanding with thinking that the wound was healing. With that being said I did not see it last week I do not know that for certain. Either way I feel like he is doing great today I see no signs of infection at this point. 11/26/2020 upon inspection today patient appears to be doing decently well in regard to his heel ulcer. He has been tolerating the dressing changes without complication. Fortunately there is no sign of active infection at this time. No fevers, chills, nausea, vomiting, or diarrhea. 12/10/2020 upon evaluation today patient appears to be doing fairly well in regard to the wound on his heel as well as what appears to be a new wound of the left first metatarsal head plantar aspect. This seems to be an area that was callus that has split as the patient tells me has been trying to walk on his toes more has probably where this came from. With that being said there does not appear to be signs of active infection which is great news. 12/17/2020 upon evaluation today patient actually appears to be making good progress currently. Fortunately there is no evidence of active infection at this time. Overall I feel like he is very close to complete closure. 12/26/2020 upon evaluation today patient appears to be doing excellent in regard to his wounds. In fact I am not certain that these are not even completely healed on initial inspection. Overall I am very pleased with where things stand today. Good news is that they are healed he is actually get ready to go out of town and that will be helpful as well as he will be a full part of the time. Readmission: 02/04/2021 upon evaluation today patient appears to be doing well at this point in regard to his left heel that I previously saw him for. Unfortunately he is having issues with his right lower extremity. He has significant wounds at this  point he also has erythema noted there is definite signs of cellulitis which is unfortunate. With that being said I think we do need to address this sooner rather than later. The good news is he did have a nice trip to the beach. He tells me that he had no issues during that time. 4/27; patient on Bactrim. Culture showed methicillin sensitive staph aureus therefore the Bactrim should be effective. 2 small areas on the right leg are healed the area on the mid aspect of the tibia almost 100% covered in a very adherent necrotic debris. We have been using silver alginate 02/20/2021 upon evaluation today patient appears to be doing well with regard to his wound. He is showing signs of improvement which is great news overall very pleased with where things stand today. No fevers, chills, nausea, vomiting, or diarrhea. 02/27/2021 upon evaluation today patient appears to be doing well with regard to his leg ulcer. He is tolerating the dressing changes and overall appears to be doing quite excellent. I am extremely pleased with where things stand and overall I think patient is making great progress. There is no sign of active infection at this time which is also great news. 03/06/2021 upon evaluation today patient appears to be doing excellent in regard to his wounds. In fact he appears  to be completely healed today based on what I am seeing. This is excellent news and overall I am extremely pleased with where he stands. Overall the patient is happy to hear this as well this has been quite sometime coming. Readmission: 04/01/2021 patient unfortunately returns for readmission today. He tells me that he has been wearing his compression socks on the right leg daily RHEA, THRUN ([id number]) and he does not really know what is going on and why his legs are doing what they are doing. We did therefore go ahead and probe deeper into exactly what has been going on with him today. Subsequently the patient tells me  that when he gets up and what we would call "first thing in the morning" are really 2 different things". He does not tend to sleep well so he tells me that he will often wake up at 3:00 in the morning. He will then potentially going into the living room to get in his chair where he may read a book for a little while and then potentially fall asleep back in his chair. He then subsequently wake up around 5:00 or so and then get up and in his words "putter around". This often will end with him proceeding at some point around 8 AM or 9 AM to putting on his compression socks. With that being said this means that anywhere from the 3:00 in the morning till roughly around 9:00 in the morning he has no compression on yet he is sitting in his recliner, walking around and up and about, and this is at least 5 to 6 hours of noncompressed time. That may be our issue here but I did not realize until we discussed this further today. Fortunately there does not appear to be any signs of active infection at this time which is great news. With that being said he has multiple wounds of the bilateral lower extremities. Electronic Signature(s) Signed: 04/01/2021 10:11:57 AM By: Worthy Keeler PA-C Entered By: Worthy Keeler on 04/01/2021 10:11:57 [name] ([id number]) -------------------------------------------------------------------------------- Physical Exam Details Patient Name: ANDRIEL, OMALLEY. Date of Service: 04/01/2021 9:30 AM Medical Record Number: [id number] Patient Account Number: [id number] Date of Birth/Sex: 17-Apr-1946 (75 y.o. M) Treating RN: Dolan Amen Primary Care Provider: Fulton Reek Other Clinician: Referring Provider: Fulton Reek Treating Provider/Extender: Skipper Cliche in Treatment: 40 Constitutional Well-nourished and well-hydrated in no acute distress. Respiratory normal breathing without difficulty. Psychiatric this patient is able to make decisions and  demonstrates good insight into disease process. Alert and Oriented x 3. pleasant and cooperative. Notes Upon inspection patient's wound bed actually showed signs of good granulation and epithelization at most places. He does have some dry skin but again I was able to mechanically cleaned this away with saline and gauze and did not appear to be any need for sharp debridement today. Overall the wounds appear clean otherwise with good granulation noted. Electronic Signature(s) Signed: 04/01/2021 10:12:21 AM By: Worthy Keeler PA-C Entered By: Worthy Keeler on 04/01/2021 10:12:21 [name] ([id number]) -------------------------------------------------------------------------------- Physician Orders Details Patient Name: ANDRES, VEST. Date of Service: 04/01/2021 9:30 AM Medical Record Number: [id number] Patient Account Number: [id number] Date of Birth/Sex: 06/23/1946 (75 y.o. 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 o Nurse Visit as needed Bathing/ Shower/ Hygiene o May shower with wound dressing protected with water repellent cover or cast protector. Edema Control - Lymphedema / Segmental Compressive Device / Other o Optional: One layer of unna paste to top of compression wrap (to act as an anchor). o  Elevate, Exercise Daily and Avoid Standing for Long Periods of Time. o Elevate legs to the level of the heart and pump ankles as often as possible o Elevate leg(s) parallel to the floor when sitting. Additional Orders / Instructions o Follow Nutritious Diet and Increase Protein Intake Wound Treatment Wound #6 - Lower Leg Wound Laterality: Right, Midline Primary Dressing: Silvercel Small 2x2 (in/in) Discharge Instructions: Apply Silvercel Small 2x2 (in/in) as instructed Secondary Dressing: ABD Pad 5x9 (in/in) Discharge Instructions: Cover with ABD pad Compression Wrap: Profore Lite LF 3 Multilayer Compression Bandaging System Discharge Instructions: Apply 3 multi-layer wrap as prescribed. Wound #7 - Lower Leg Wound Laterality: Left, Midline, Distal Primary Dressing: Silvercel Small 2x2 (in/in) Discharge Instructions: Apply Silvercel Small 2x2 (in/in) as instructed Secondary Dressing: ABD Pad 5x9 (in/in) Discharge Instructions: Cover with ABD pad Compression Wrap: Profore Lite LF 3 Multilayer Compression Bandaging System Discharge Instructions: Apply 3 multi-layer wrap as prescribed. Wound #8 - Lower Leg Wound Laterality: Left, Lateral Primary Dressing: Silvercel Small 2x2 (in/in) Discharge Instructions: Apply Silvercel Small 2x2 (in/in) as instructed IMRI, LOR ([id number]) Secondary Dressing: ABD Pad 5x9 (in/in) Discharge Instructions: Cover with ABD pad Compression Wrap: Profore Lite LF 3 Multilayer Compression Bandaging System Discharge Instructions: Apply 3 multi-layer wrap as prescribed. Electronic Signature(s) Signed: 04/01/2021 3:46:32 PM By: [name], BSN, RN, CWS, [name] RN, BSN Signed: 04/03/2021 4:01:31 PM By: Worthy Keeler PA-C Entered By: [name] BSN, RN, CWS, [name] on 04/01/2021 10:09:39 GAMALIEL, CHARNEY ([id number]) -------------------------------------------------------------------------------- Problem List Details Patient Name: ROMIR, KLIMOWICZ. Date of  Service: 04/01/2021 9:30 AM Medical Record Number: [id number] Patient Account Number: [id number] Date of Birth/Sex: [birth year] (75 y.o. M) Treating RN: Dolan Amen Primary Care Provider: Fulton Reek Other Clinician: Referring Provider: Fulton Reek Treating Provider/Extender: Skipper Cliche in Treatment: 27 Active Problems ICD-10 Encounter Code Description Active Date MDM Diagnosis L03.115 Cellulitis of right lower limb 02/04/2021 No Yes L97.812 Non-pressure chronic ulcer of other part of right lower leg with fat layer 10/08/2020 No Yes exposed L97.822 Non-pressure chronic ulcer of other part of left lower leg with fat layer 04/01/2021 No Yes exposed E11.622 Type 2 diabetes mellitus with other skin ulcer 02/04/2021 No Yes Z95.4 Presence of other heart-valve replacement 09/24/2020 No Yes Z79.01 Long term (current) use of anticoagulants 09/24/2020 No Yes L84 Corns and callosities 12/26/2020 No Yes Inactive Problems Resolved Problems ICD-10 Code Description Active Date Resolved Date E11.621 Type 2 diabetes mellitus with foot ulcer 09/24/2020 09/24/2020 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 Date of Service: 04/01/2021 9:30 AM Medical Record Number: [id number] Patient Account Number: [id number] Date of Birth/Sex: [birth year] (75 y.o. M) Treating RN: Dolan Amen Primary Care  Provider: Fulton Reek Other Clinician: Referring Provider: Fulton Reek Treating Provider/Extender: Skipper Cliche in Treatment: 27 Subjective Chief Complaint Information obtained from Patient Right LE ulcers and cellulitis History of Present Illness (HPI) 09/24/2020 on  evaluation today patient presents today for a heel ulcer that he tells me has been present for about 2 years. He has been seeing podiatry and they have been attempting to manage this including what sounds to be a total contact cast, Unna boot, and just standard dressings otherwise as well. Most recently has been using triple antibiotic ointment. With that being said unfortunately despite everything he really has not had any significant improvement. He tells me that he cannot even really remember exactly how this began but he presumed it may have rubbed on his shoes or something of that nature. 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 He tells me that this does seem to help. Fortunately there is no signs of active infection at this time. His most recent hemoglobin A1c was 8.1 though he knows this was this year he cannot tell me the exact time. His fluid pills currently to help with some of the lower extremity edema although he does obviously have signs of venous stasis/lymphedema. Currently there is no evidence of active infection. No fevers, chills, nausea, vomiting, or diarrhea. Patient has had fairly recent ABIs which were performed on 07/19/2020 and revealed that he has normal findings in both the ankle and toe locations bilaterally. His ABI on the right was 1.09 on the left was 1.08 with a TBI on the right of 0.88 and on the left of 0.94. Triphasic flow was noted throughout. 10/08/2020 on evaluation today patient appears to be doing pretty well in regard to his left heel currently in fact this is doing a great job and seems to be healing quite nicely. Unfortunately on his right leg he had a pile of wood that actually  fell on him injuring his right leg this is somewhat erythematous has me concerned little bit about cellulitis though there is not really a good area to culture at this point. 10/24/2020 upon evaluation today patient appears to be doing well with regard to his heel ulcer. He is showing signs of improvement which is great news. His right leg is completely healed. Overall I feel like he is doing excellent and there is no signs of infection. 11/07/2020 upon evaluation today patient appears to be doing well with regard to his heel ulcer. He tells me that last week when he was unable to come in his wife actually thought that the wound was very close to closing if not closed. Then it began to "reopen again". I really feel like what may have happened as the collagen may have dried over the wound bed and that because that misunderstanding with thinking that the wound was healing. With that being said I did not see it last week I do not know that for certain. Either way I feel like he is doing great today I see no signs of infection at this point. 11/26/2020 upon inspection today patient appears to be doing decently well in regard to his heel ulcer. He has been tolerating the dressing changes without complication. Fortunately there  is no sign of active infection at this time. No fevers, chills, nausea, vomiting, or diarrhea. 12/10/2020 upon evaluation today patient appears to be doing fairly well in regard to the wound on his heel as well as what appears to be a new wound of the left first metatarsal head plantar aspect. This seems to be an area that was callus that has split as the patient tells me has been trying to walk on his toes more has probably where this came from. With that being said there does not appear to be signs of active infection which is great news. 12/17/2020 upon evaluation today patient actually appears to be making good progress currently. Fortunately there is no evidence of active infection at  this time. Overall I feel like he is very close to complete closure. 12/26/2020 upon evaluation today patient appears to be doing excellent in regard to his wounds. In fact I am not certain that these are not even completely healed on initial inspection. Overall I am very pleased with where things stand today. Good news is that they are healed he is actually get ready to go out of town and that will be helpful as well as he will be a full part of the time. Readmission: 02/04/2021 upon evaluation today patient appears to be doing well at this point in regard to his left heel that I previously saw him for. Unfortunately he is having issues with his right lower extremity. He has significant wounds at this point he also has erythema noted there is definite signs of cellulitis which is unfortunate. With that being said I think we do need to address this sooner rather than later. The good news is he did have a nice trip to the beach. He tells me that he had no issues during that time. 4/27; patient on Bactrim. Culture showed methicillin sensitive staph aureus therefore the Bactrim should be effective. 2 small areas on the right leg are healed the area on the mid aspect of the tibia almost 100% covered in a very adherent necrotic debris. We have been using silver alginate 02/20/2021 upon evaluation today patient appears to be doing well with regard to his wound. He is showing signs of improvement which is great news overall very pleased with where things stand today. No fevers, chills, nausea, vomiting, or diarrhea. 02/27/2021 upon evaluation today patient appears to be doing well with regard to his leg ulcer. He is tolerating the dressing changes and overall appears to be doing quite excellent. I am extremely pleased with where things stand and overall I think patient is making great progress. There is no sign of active infection at this time which is also great news. 03/06/2021 upon evaluation today patient  appears to be doing excellent in regard to his wounds. In fact he appears to be completely healed today based on what I am seeing. This is excellent news and overall I am extremely pleased with where he stands. Overall the patient is happy to hear EDMUND, HOLCOMB ([id number]) this as well this has been quite sometime coming. Readmission: 04/01/2021 patient unfortunately returns for readmission today. He tells me that he has been wearing his compression socks on the right leg daily and he does not really know what is going on and why his legs are doing what they are doing. We did therefore go ahead and probe deeper into exactly what has been going on with him today. Subsequently the patient tells me that when he gets up and  what we would call "first thing in the morning" are really 2 different things". He does not tend to sleep well so he tells me that he will often wake up at 3:00 in the morning. He will then potentially going into the living room to get in his chair where he may read a book for a little while and then potentially fall asleep back in his chair. He then subsequently wake up around 5:00 or so and then get up and in his words "putter around". This often will end with him proceeding at some point around 8 AM or 9 AM to putting on his compression socks. With that being said this means that anywhere from the 3:00 in the morning till roughly around 9:00 in the morning he has no compression on yet he is sitting in his recliner, walking around and up and about, and this is at least 5 to 6 hours of noncompressed time. That may be our issue here but I did not realize until we discussed this further today. Fortunately there does not appear to be any signs of active infection at this time which is great news. With that being said he has multiple wounds of the bilateral lower extremities. Objective Constitutional Well-nourished and well-hydrated in no acute distress. Vitals Time Taken: 9:36  AM, Height: 70 in, Weight: 262 lbs, BMI: 37.6, Temperature: 98.6 F, Pulse: 85 bpm, Respiratory Rate: 18 breaths/min, Blood Pressure: 107/66 mmHg. Respiratory normal breathing without difficulty. Psychiatric this patient is able to make decisions and demonstrates good insight into disease process. Alert and Oriented x 3. pleasant and cooperative. General Notes: Upon inspection patient's wound bed actually showed signs of good granulation and epithelization at most places. He does have some dry skin but again I was able to mechanically cleaned this away with saline and gauze and did not appear to be any need for sharp debridement today. Overall the wounds appear clean otherwise with good granulation noted. Integumentary (Hair, Skin) Wound #6 status is Open. Original cause of wound was Gradually Appeared. The date acquired was: 03/24/2021. The wound is located on the Right,Midline Lower Leg. The wound measures 12cm length x 10cm width x 0.1cm depth; 94.248cm^2 area and 9.425cm^3 volume. There is Fat Layer (Subcutaneous Tissue) exposed. There is no tunneling or undermining noted. There is a medium amount of serosanguineous drainage noted. There is small (1-33%) red, pink granulation within the wound bed. There is a large (67-100%) amount of necrotic tissue within the wound bed including Eschar and Adherent Slough. Wound #7 status is Open. Original cause of wound was Gradually Appeared. The date acquired was: 03/24/2021. The wound is located on the Left,Distal,Midline Lower Leg. The wound measures 2cm length x 2cm width x 0.1cm depth; 3.142cm^2 area and 0.314cm^3 volume. There is Fat Layer (Subcutaneous Tissue) exposed. There is no tunneling or undermining noted. There is a medium amount of serosanguineous drainage noted. There is medium (34-66%) granulation within the wound bed. There is a medium (34-66%) amount of necrotic tissue within the wound bed including Eschar and Adherent Slough. Wound #8  status is Open. Original cause of wound was Gradually Appeared. The date acquired was: 03/24/2021. The wound is located on the Left,Lateral Lower Leg. The wound measures 0.7cm length x 2.2cm width x 0.1cm depth; 1.21cm^2 area and 0.121cm^3 volume. There is Fat Layer (Subcutaneous Tissue) exposed. There is no tunneling or undermining noted. There is a large amount of serous drainage noted. There is medium (34-66%) red granulation within the wound bed.  There is a medium (34-66%) amount of necrotic tissue within the wound bed. 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 There was a Three Layer Compression Therapy Procedure with a pre-treatment ABI of 1.1 by [name], RN. Post procedure Diagnosis Wound #6: Same as Pre-Procedure Wound #7 Pre-procedure diagnosis of Wound #7 is a Lymphedema located on the Left,Distal,Midline Lower Leg . There was a Three Layer Compression Therapy Procedure with a pre-treatment ABI of 1.2 by [name], RN. Post procedure Diagnosis Wound #7: Same as Pre-Procedure Plan Follow-up Appointments: Return Appointment in 1 week. Nurse Visit as needed Bathing/ Shower/ Hygiene: May shower with wound dressing protected with water repellent cover or cast protector. Edema Control - Lymphedema / Segmental Compressive Device / Other: Optional: One layer of unna paste to top of compression wrap (to act as an anchor). Elevate, Exercise Daily and Avoid Standing for Long Periods of Time. Elevate legs to  the level of the heart and pump ankles as often as possible Elevate leg(s) parallel to the floor when sitting. Additional Orders / Instructions: Follow Nutritious Diet and Increase Protein Intake WOUND #6: - Lower Leg Wound Laterality: Right, Midline Primary Dressing: Silvercel Small 2x2 (in/in) Discharge Instructions: Apply Silvercel Small 2x2 (in/in) as instructed Secondary Dressing: ABD Pad 5x9 (in/in) Discharge Instructions: Cover with ABD pad Compression Wrap: Profore Lite LF 3 Multilayer Compression Bandaging System Discharge Instructions: Apply 3 multi-layer wrap as prescribed. WOUND #7: - Lower Leg Wound Laterality: Left, Midline, Distal Primary Dressing: Silvercel Small 2x2 (in/in) Discharge Instructions: Apply Silvercel Small 2x2 (in/in) as instructed Secondary Dressing: ABD Pad 5x9 (in/in) Discharge Instructions: Cover with ABD pad Compression Wrap: Profore Lite LF 3 Multilayer Compression Bandaging System Discharge Instructions: Apply 3 multi-layer wrap as prescribed. WOUND #8: - Lower Leg Wound Laterality: Left, Lateral Primary Dressing: Silvercel Small 2x2 (in/in) Discharge Instructions: Apply Silvercel Small 2x2 (in/in) as instructed Secondary Dressing: ABD Pad 5x9 (in/in) Discharge Instructions: Cover with ABD pad Compression Wrap: Profore Lite LF 3 Multilayer Compression Bandaging System Discharge Instructions: Apply 3 multi-layer wrap as prescribed. 1. Would recommend currently that we go ahead and initiate treatment with a reinitiation of the silver alginate along with a 3 layer compression wrap this is done well in the past. 2. I am also can recommend the patient continue to elevate his legs much as possible. 3. Also want him to bring in his compression socks next week for me to have a look at him to check his strength. With that being said I did explain to the patient as well that he honestly does need to make sure to put these on when he gets up if that is at  3:00 in the morning that should be the time. With that being said if he even goes to sleep but say 10 and wakes up at 3 but still gives him a little bit of a break outside of the socks while he sleeping in the bed and then he can put him back on but I think he needs to do this first thing in the morning and  not wait until things get more out of control. He voiced understanding. We will see patient back for reevaluation in 1 week here in the clinic. If anything worsens or changes patient will contact our office for additional recommendations. LEO, FRAY ([id number]) Electronic Signature(s) Signed: 04/01/2021 10:13:14 AM By: Worthy Keeler PA-C Entered By: Worthy Keeler on 04/01/2021 10:13:14 MYAN, SUIT ([id number]) -------------------------------------------------------------------------------- SuperBill Details Patient Name: LEVANTE, SIMONES. Date of Service: 04/01/2021 Medical Record Number: [id number] Patient Account Number: [id number] Date of Birth/Sex: 1946-06-04 (75 y.o. M) Treating RN: [name] Primary Care Provider: Fulton Reek Other Clinician: Referring Provider: Fulton Reek Treating Provider/Extender: Skipper Cliche in Treatment: 27 Diagnosis Coding ICD-10 Codes Code Description L03.115 Cellulitis of right lower limb L97.812 Non-pressure chronic ulcer of other part of right lower leg with fat layer exposed L97.822 Non-pressure chronic ulcer of other part of left lower leg with fat layer exposed E11.622 Type 2 diabetes mellitus with other skin ulcer Z95.4 Presence of other heart-valve replacement Z79.01 Long term (current) use of anticoagulants L84 Corns and callosities Facility Procedures CPT4: Description Modifier Quantity Code [id number] BILATERAL: Application of multi-layer venous compression system; leg (below knee), including 1 ankle and foot. Physician Procedures CPT4 Code: [id number] Description: 07225 - WC PHYS LEVEL 4 - EST  PT Modifier: Quantity: 1 CPT4 Code: Description: ICD-10 Diagnosis Description J50.518 Non-pressure chronic ulcer of other part of right lower leg with fat la L97.822 Non-pressure chronic ulcer of other part of left lower leg with fat lay E11.622 Type 2 diabetes mellitus with other skin  ulcer Z95.4 Presence of other heart-valve replacement Modifier: yer exposed er exposed Quantity: Electronic Signature(s) Signed: 04/01/2021 10:13:35 AM By: Worthy Keeler PA-C Entered By: Worthy Keeler on 04/01/2021 10:13:34

## 2021-04-10 ENCOUNTER — Other Ambulatory Visit: Payer: Self-pay

## 2021-04-10 ENCOUNTER — Encounter: Payer: Medicare HMO | Admitting: Physician Assistant

## 2021-04-10 DIAGNOSIS — E11622 Type 2 diabetes mellitus with other skin ulcer: Secondary | ICD-10-CM | POA: Diagnosis not present

## 2021-04-10 NOTE — Progress Notes (Addendum)
DEQUANE, STRAHAN ([id number]) Visit Report for 04/10/2021 Chief Complaint Document Details Patient Name: [name], [name]. Date of Service: 04/10/2021 8:30 AM Medical Record Number: [id number] Patient Account Number: [id number] Date of Birth/Sex: 1946/05/06 (75 y.o. M) Treating RN: Dolan Amen Primary Care Provider: Fulton Reek Other Clinician: Referring Provider: Fulton Reek Treating Provider/Extender: Skipper Cliche in Treatment: 28 Information Obtained from: Patient Chief Complaint Right LE ulcers and cellulitis Electronic Signature(s) Signed: 04/10/2021 8:52:32 AM By: Worthy Keeler PA-C Entered By: Worthy Keeler on 04/10/2021 08:52:32 BERTIE, SIMIEN ([id number]) -------------------------------------------------------------------------------- HPI Details Patient Name: [name], [name]. Date of Service: 04/10/2021 8:30 AM Medical Record Number: [id number] Patient Account Number: [id number] Date of Birth/Sex: [birth year] (75 y.o. M) Treating RN: Dolan Amen Primary Care Provider: Fulton Reek Other Clinician: Referring Provider: Fulton Reek Treating Provider/Extender: Skipper Cliche in Treatment: 28 History of Present Illness HPI Description: 09/24/2020 on evaluation today patient presents today for a heel ulcer that he tells me has been present for about 2 years. He has been seeing podiatry and they have been attempting to manage this including what sounds to be a total contact cast, Unna boot, and just standard dressings otherwise as well. Most recently has been using triple antibiotic ointment. With that being said unfortunately despite everything he really has not had any significant improvement. He tells me that he cannot even really remember exactly how this began but he presumed it may have rubbed on his shoes or something of that nature. 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 He tells me that this does seem to help. Fortunately there is no signs of active infection at this time. His most recent hemoglobin A1c was 8.1 though he knows this was this year he cannot tell me the exact time. His fluid pills currently to help with some of the lower extremity edema although he does obviously have signs of venous stasis/lymphedema. Currently there is no evidence of active infection. No fevers, chills, nausea, vomiting, or diarrhea. Patient has had fairly recent ABIs which were performed on 07/19/2020 and revealed that he has normal findings in both the ankle and toe locations bilaterally. His ABI on the right was 1.09 on the left was 1.08 with a TBI on the right of 0.88 and on the left of 0.94. Triphasic flow was noted throughout. 10/08/2020 on evaluation today patient appears to be doing pretty well in regard to his left heel currently in fact this is doing a great job and seems to be healing quite nicely. Unfortunately on his right leg he had a pile of wood that actually fell on him injuring his right leg this is somewhat erythematous has me concerned little bit about cellulitis though there is not really a good area to culture at this point. 10/24/2020 upon evaluation today patient appears to be doing well with regard to his heel ulcer. He is showing signs of improvement which is great news. His right leg is completely healed. Overall I feel like he is doing excellent and there is no signs of infection. 11/07/2020 upon evaluation today patient appears to be doing well with regard to his heel ulcer. He tells me that last week when he was unable to  come in his wife actually thought that the wound was very close to closing if not closed. Then it began to "reopen again". I  really feel like what may have happened as the collagen may have dried over the wound bed and that because that misunderstanding with thinking that the wound was healing. With that being said I did not see it last week I do not know that for certain. Either way I feel like he is doing great today I see no signs of infection at this point. 11/26/2020 upon inspection today patient appears to be doing decently well in regard to his heel ulcer. He has been tolerating the dressing changes without complication. Fortunately there is no sign of active infection at this time. No fevers, chills, nausea, vomiting, or diarrhea. 12/10/2020 upon evaluation today patient appears to be doing fairly well in regard to the wound on his heel as well as what appears to be a new wound of the left first metatarsal head plantar aspect. This seems to be an area that was callus that has split as the patient tells me has been trying to walk on his toes more has probably where this came from. With that being said there does not appear to be signs of active infection which is great news. 12/17/2020 upon evaluation today patient actually appears to be making good progress currently. Fortunately there is no evidence of active infection at this time. Overall I feel like he is very close to complete closure. 12/26/2020 upon evaluation today patient appears to be doing excellent in regard to his wounds. In fact I am not certain that these are not even completely healed on initial inspection. Overall I am very pleased with where things stand today. Good news is that they are healed he is actually get ready to go out of town and that will be helpful as well as he will be a full part of the time. Readmission: 02/04/2021 upon evaluation today patient appears to be doing well at this point in regard to his left heel that I previously saw him for. Unfortunately he is having issues with his right lower extremity. He has significant wounds at this  point he also has erythema noted there is definite signs of cellulitis which is unfortunate. With that being said I think we do need to address this sooner rather than later. The good news is he did have a nice trip to the beach. He tells me that he had no issues during that time. 4/27; patient on Bactrim. Culture showed methicillin sensitive staph aureus therefore the Bactrim should be effective. 2 small areas on the right leg are healed the area on the mid aspect of the tibia almost 100% covered in a very adherent necrotic debris. We have been using silver alginate 02/20/2021 upon evaluation today patient appears to be doing well with regard to his wound. He is showing signs of improvement which is great news overall very pleased with where things stand today. No fevers, chills, nausea, vomiting, or diarrhea. 02/27/2021 upon evaluation today patient appears to be doing well with regard to his leg ulcer. He is tolerating the dressing changes and overall appears to be doing quite excellent. I am extremely pleased with where things stand and overall I think patient is making great progress. There is no sign of active infection at this time which is also great news. 03/06/2021 upon evaluation today patient appears to be doing excellent in regard to his wounds. In fact he appears  to be completely healed today based on what I am seeing. This is excellent news and overall I am extremely pleased with where he stands. Overall the patient is happy to hear this as well this has been quite sometime coming. Readmission: 04/01/2021 patient unfortunately returns for readmission today. He tells me that he has been wearing his compression socks on the right leg daily KILLIAN, SCHWER ([id number]) and he does not really know what is going on and why his legs are doing what they are doing. We did therefore go ahead and probe deeper into exactly what has been going on with him today. Subsequently the patient tells me  that when he gets up and what we would call "first thing in the morning" are really 2 different things". He does not tend to sleep well so he tells me that he will often wake up at 3:00 in the morning. He will then potentially going into the living room to get in his chair where he may read a book for a little while and then potentially fall asleep back in his chair. He then subsequently wake up around 5:00 or so and then get up and in his words "putter around". This often will end with him proceeding at some point around 8 AM or 9 AM to putting on his compression socks. With that being said this means that anywhere from the 3:00 in the morning till roughly around 9:00 in the morning he has no compression on yet he is sitting in his recliner, walking around and up and about, and this is at least 5 to 6 hours of noncompressed time. That may be our issue here but I did not realize until we discussed this further today. Fortunately there does not appear to be any signs of active infection at this time which is great news. With that being said he has multiple wounds of the bilateral lower extremities. 04/10/2021 upon evaluation today patient appears to be doing well with regard to his legs for the most part. He does look like he had some injury where his wrap slid down nonetheless he did some "doctoring on them". I do feel like most of the areas honestly have cleared back up which is great news. There does not appear to be any signs of active infection at this time which is also great news. No fevers, chills, nausea, vomiting, or diarrhea. Electronic Signature(s) Signed: 04/10/2021 9:59:04 AM By: Worthy Keeler PA-C Entered By: Worthy Keeler on 04/10/2021 09:59:04 SHUNSUKE, GRANZOW ([id number]) -------------------------------------------------------------------------------- Physical Exam Details Patient Name: RYANE, CANAVAN Date of Service: 04/10/2021 8:30 AM Medical Record Number:  [id number] Patient Account Number: [id number] Date of Birth/Sex: Feb 28, 1946 (75 y.o. M) Treating RN: Dolan Amen Primary Care Provider: Fulton Reek Other Clinician: Referring Provider: Fulton Reek Treating Provider/Extender: Skipper Cliche in Treatment: 34 Constitutional Well-nourished and well-hydrated in no acute distress. Respiratory normal breathing without difficulty. Psychiatric this patient is able to make decisions and demonstrates good insight into disease process. Alert and Oriented x 3. pleasant and cooperative. Notes Upon inspection patient's wound bed showed signs of good granulation epithelization at this point. There does not appear to be any signs of active infection which is great news and overall very pleased with where things stand today. With that being said I did not have to perform any sharp debridement which is great news. Electronic Signature(s) Signed: 04/10/2021 9:59:35 AM By: Worthy Keeler PA-C Entered By: Worthy Keeler on 04/10/2021 09:59:35 [place],  DIMA FERRUFINO ([id number]) -------------------------------------------------------------------------------- Physician Orders Details Patient Name: OKLEY, MAGNUSSEN. Date of Service: 04/10/2021 8:30 AM Medical Record Number: [id number] Patient Account Number: [id number] Date of Birth/Sex: 1946-05-16 (75 y.o. 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 o Nurse Visit as needed Bathing/ Shower/ Hygiene o May shower with wound dressing protected with water repellent cover or cast protector. o No tub bath. Edema Control - Lymphedema / Segmental Compressive Device / Other o Optional: One layer of unna paste to top of compression wrap (to act as an anchor). o Elevate, Exercise Daily and Avoid Standing for Long Periods of Time. o Elevate legs to the level of the heart and pump ankles as often as possible o Elevate leg(s) parallel to the floor when sitting. Additional Orders / Instructions o Follow Nutritious Diet and Increase Protein Intake Wound Treatment Wound #6 - Lower Leg Wound Laterality: Right, Midline Cleanser: Soap and Water 1 x Per Week/30 Days Discharge Instructions: Gently cleanse wound with antibacterial soap, rinse and pat dry prior to dressing wounds Peri-Wound Care: Moisturizing Lotion 1 x Per Week/30 Days Discharge Instructions: Suggestions: Theraderm, Eucerin, Cetaphil, or patient preference. Peri-Wound Care: Triamcinolone Acetonide Cream, 0.1%, 15 (g) tube 1 x Per Week/30 Days Primary Dressing: Silvercel Small 2x2 (in/in) 1 x Per Week/30 Days Discharge Instructions: Apply Silvercel Small 2x2 (in/in) as instructed Secondary Dressing: ABD Pad 5x9 (in/in) 1 x Per Week/30 Days Discharge Instructions: Cover with ABD pad Compression Wrap: Profore Lite LF 3 Multilayer Compression Bandaging System 1 x Per Week/30 Days Discharge Instructions: Apply 3 multi-layer wrap as prescribed. Wound #7 - Lower Leg Wound Laterality: Left, Midline, Distal Cleanser: Soap and Water 1 x Per Week/30 Days Discharge Instructions: Gently cleanse wound with antibacterial soap, rinse and pat dry prior to dressing wounds Peri-Wound Care: Moisturizing Lotion 1 x Per Week/30 Days Discharge Instructions: Suggestions:  Theraderm, Eucerin, Cetaphil, or patient preference. JARELL, MCEWEN ([id number]) Peri-Wound Care: Triamcinolone Acetonide Cream, 0.1%, 15 (g) tube 1 x Per Week/30 Days Primary Dressing: Silvercel Small 2x2 (in/in) 1 x Per Week/30 Days Discharge Instructions: Apply Silvercel Small 2x2 (in/in) as instructed Secondary Dressing: ABD Pad 5x9 (in/in) 1 x Per Week/30 Days Discharge Instructions: Cover with ABD pad Compression Wrap: Profore Lite LF 3 Multilayer Compression Bandaging System 1 x Per Week/30 Days Discharge Instructions: Apply 3 multi-layer wrap as prescribed. Wound #8 - Lower Leg Wound Laterality: Left, Lateral Cleanser: Soap and Water 1 x Per Week/30 Days Discharge Instructions: Gently cleanse wound with antibacterial soap, rinse and pat dry prior to dressing wounds Peri-Wound Care: Moisturizing Lotion 1 x Per Week/30 Days Discharge Instructions: Suggestions: Theraderm, Eucerin, Cetaphil, or patient preference. Peri-Wound Care: Triamcinolone Acetonide Cream, 0.1%, 15 (g) tube 1 x Per Week/30 Days Primary Dressing: Silvercel Small 2x2 (in/in) 1 x Per Week/30 Days Discharge Instructions: Apply Silvercel Small 2x2 (in/in) as instructed Secondary Dressing: ABD Pad 5x9 (in/in) 1 x Per Week/30 Days Discharge Instructions: Cover  with ABD pad Compression Wrap: Profore Lite LF 3 Multilayer Compression Bandaging System 1 x Per Week/30 Days Discharge Instructions: Apply 3 multi-layer wrap as prescribed. Electronic Signature(s) Signed: 04/10/2021 4:09:04 PM By: Worthy Keeler PA-C Signed: 04/10/2021 4:26:32 PM By: Georges Mouse, Minus Breeding RN Entered By: Georges Mouse, Minus Breeding on 04/10/2021 09:09:10 VESTER, BALTHAZOR ([id number]) -------------------------------------------------------------------------------- Problem List Details Patient Name: CHAZE, HRUSKA. Date of Service: 04/10/2021 8:30 AM Medical Record Number: [id number] Patient Account Number: [id number] Date of Birth/Sex: [birth year]  (75 y.o. M) Treating RN: Dolan Amen Primary Care Provider: Fulton Reek Other Clinician: Referring Provider: Fulton Reek Treating Provider/Extender: Skipper Cliche in Treatment: 28 Active Problems ICD-10 Encounter Code Description Active Date MDM Diagnosis L03.115 Cellulitis of right lower limb 02/04/2021 No Yes L97.812 Non-pressure chronic ulcer of other part of right lower leg with fat layer 10/08/2020 No Yes exposed L97.822 Non-pressure chronic ulcer of other part of left lower leg with fat layer 04/01/2021 No Yes exposed E11.622 Type 2 diabetes mellitus with other skin ulcer 02/04/2021 No Yes Z95.4 Presence of other heart-valve replacement 09/24/2020 No Yes Z79.01 Long term (current) use of anticoagulants 09/24/2020 No Yes L84 Corns and callosities 12/26/2020 No Yes Inactive Problems Resolved Problems ICD-10 Code Description Active Date Resolved Date E11.621 Type 2 diabetes mellitus with foot ulcer 09/24/2020 09/24/2020 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 Date of Service: 04/10/2021 8:30 AM Medical Record Number: [id number] Patient Account Number: [id number] Date of Birth/Sex: 04-14-1946 (75 y.o. M) Treating RN: Dolan Amen Primary Care Provider: Fulton Reek Other Clinician: Referring Provider: Fulton Reek Treating Provider/Extender: Skipper Cliche in Treatment: 28 Subjective Chief Complaint Information obtained from Patient Right LE ulcers and cellulitis History of Present Illness (HPI) 09/24/2020 on evaluation today patient presents today for a heel ulcer that he tells me has been present for about 2 years. He has been  seeing podiatry and they have been attempting to manage this including what sounds to be a total contact cast, Unna boot, and just standard dressings otherwise as well. Most recently has been using triple antibiotic ointment. With that being said unfortunately despite everything he really has not had any significant improvement. He tells me that he cannot even really remember exactly how this began but he presumed it may have rubbed on his shoes or something of that nature. 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 He tells me that this does seem to help. Fortunately there is no signs of active infection at this time. His most recent hemoglobin A1c was 8.1 though he knows this was this year he cannot tell me the exact time. His fluid pills currently to help with some of the lower extremity edema although he does obviously have signs of venous stasis/lymphedema. Currently there is no evidence of active infection. No fevers, chills, nausea, vomiting, or diarrhea. Patient has had fairly recent ABIs which were performed on 07/19/2020 and revealed that he has normal findings in both the ankle and toe locations bilaterally. His ABI on the right was  1.09 on the left was 1.08 with a TBI on the right of 0.88 and on the left of 0.94. Triphasic flow was noted throughout. 10/08/2020 on evaluation today patient appears to be doing pretty well in regard to his left heel currently in fact this is doing a great job and seems to be healing quite nicely. Unfortunately on his right leg he had a pile of wood that actually fell on him injuring his right leg this is somewhat erythematous has me concerned little bit about cellulitis though there  is not really a good area to culture at this point. 10/24/2020 upon evaluation today patient appears to be doing well with regard to his heel ulcer. He is showing signs of improvement which is great news. His right leg is completely healed. Overall I feel like he is doing excellent and there is no signs of infection. 11/07/2020 upon evaluation today patient appears to be doing well with regard to his heel ulcer. He tells me that last week when he was unable to come in his wife actually thought that the wound was very close to closing if not closed. Then it began to "reopen again". I really feel like what may have happened as the collagen may have dried over the wound bed and that because that misunderstanding with thinking that the wound was healing. With that being said I did not see it last week I do not know that for certain. Either way I feel like he is doing great today I see no signs of infection at this point. 11/26/2020 upon inspection today patient appears to be doing decently well in regard to his heel ulcer. He has been tolerating the dressing changes without complication. Fortunately there is no sign of active infection at this time. No fevers, chills, nausea, vomiting, or diarrhea. 12/10/2020 upon evaluation today patient appears to be doing fairly well in regard to the wound on his heel as well as what appears to be a new wound of the left first metatarsal head plantar aspect. This seems to be an area that was callus that has split as the patient tells me has been trying to walk on his toes more has probably where this came from. With that being said there does not appear to be signs of active infection which is great news. 12/17/2020 upon evaluation today patient actually appears to be making good progress currently. Fortunately there is no evidence of active infection at this time. Overall I feel like he is very close to complete closure. 12/26/2020 upon evaluation today patient appears to be  doing excellent in regard to his wounds. In fact I am not certain that these are not even completely healed on initial inspection. Overall I am very pleased with where things stand today. Good news is that they are healed he is actually get ready to go out of town and that will be helpful as well as he will be a full part of the time. Readmission: 02/04/2021 upon evaluation today patient appears to be doing well at this point in regard to his left heel that I previously saw him for. Unfortunately he is having issues with his right lower extremity. He has significant wounds at this point he also has erythema noted there is definite signs of cellulitis which is unfortunate. With that being said I think we do need to address this sooner rather than later. The good news is he did have a nice trip to the beach. He tells me that he  had no issues during that time. 4/27; patient on Bactrim. Culture showed methicillin sensitive staph aureus therefore the Bactrim should be effective. 2 small areas on the right leg are healed the area on the mid aspect of the tibia almost 100% covered in a very adherent necrotic debris. We have been using silver alginate 02/20/2021 upon evaluation today patient appears to be doing well with regard to his wound. He is showing signs of improvement which is great news overall very pleased with where things stand today. No fevers, chills, nausea, vomiting, or diarrhea. 02/27/2021 upon evaluation today patient appears to be doing well with regard to his leg ulcer. He is tolerating the dressing changes and overall appears to be doing quite excellent. I am extremely pleased with where things stand and overall I think patient is making great progress. There is no sign of active infection at this time which is also great news. 03/06/2021 upon evaluation today patient appears to be doing excellent in regard to his wounds. In fact he appears to be completely healed today based on what I am  seeing. This is excellent news and overall I am extremely pleased with where he stands. Overall the patient is happy to hear WHITTAKER, LENIS ([id number]) this as well this has been quite sometime coming. Readmission: 04/01/2021 patient unfortunately returns for readmission today. He tells me that he has been wearing his compression socks on the right leg daily and he does not really know what is going on and why his legs are doing what they are doing. We did therefore go ahead and probe deeper into exactly what has been going on with him today. Subsequently the patient tells me that when he gets up and what we would call "first thing in the morning" are really 2 different things". He does not tend to sleep well so he tells me that he will often wake up at 3:00 in the morning. He will then potentially going into the living room to get in his chair where he may read a book for a little while and then potentially fall asleep back in his chair. He then subsequently wake up around 5:00 or so and then get up and in his words "putter around". This often will end with him proceeding at some point around 8 AM or 9 AM to putting on his compression socks. With that being said this means that anywhere from the 3:00 in the morning till roughly around 9:00 in the morning he has no compression on yet he is sitting in his recliner, walking around and up and about, and this is at least 5 to 6 hours of noncompressed time. That may be our issue here but I did not realize until we discussed this further today. Fortunately there does not appear to be any signs of active infection at this time which is great news. With that being said he has multiple wounds of the bilateral lower extremities. 04/10/2021 upon evaluation today patient appears to be doing well with regard to his legs for the most part. He does look like he had some injury where his wrap slid down nonetheless he did some "doctoring on them". I do feel like  most of the areas honestly have cleared back up which is great news. There does not appear to be any signs of active infection at this time which is also great news. No fevers, chills, nausea, vomiting, or diarrhea. Objective Constitutional Well-nourished and well-hydrated in no acute distress. Vitals Time Taken:  8:33 AM, Height: 70 in, Weight: 262 lbs, BMI: 37.6, Temperature: 98.3 F, Pulse: 70 bpm, Respiratory Rate: 18 breaths/min, Blood Pressure: 143/72 mmHg. Respiratory normal breathing without difficulty. Psychiatric this patient is able to make decisions and demonstrates good insight into disease process. Alert and Oriented x 3. pleasant and cooperative. General Notes: Upon inspection patient's wound bed showed signs of good granulation epithelization at this point. There does not appear to be any signs of active infection which is great news and overall very pleased with where things stand today. With that being said I did not have to perform any sharp debridement which is great news. Integumentary (Hair, Skin) Wound #6 status is Open. Original cause of wound was Gradually Appeared. The date acquired was: 03/24/2021. The wound has been in treatment 1 weeks. The wound is located on the Right,Midline Lower Leg. The wound measures 12cm length x 10cm width x 0.1cm depth; 94.248cm^2 area and 9.425cm^3 volume. There is Fat Layer (Subcutaneous Tissue) exposed. There is no tunneling or undermining noted. There is a medium amount of serosanguineous drainage noted. There is small (1-33%) red, pink granulation within the wound bed. There is a large (67-100%) amount of necrotic tissue within the wound bed including Eschar and Adherent Slough. Wound #7 status is Open. Original cause of wound was Gradually Appeared. The date acquired was: 03/24/2021. The wound has been in treatment 1 weeks. The wound is located on the Left,Distal,Midline Lower Leg. The wound measures 1cm length x 1cm width x 0.1cm depth;  0.785cm^2 area and 0.079cm^3 volume. There is Fat Layer (Subcutaneous Tissue) exposed. There is no tunneling or undermining noted. There is a medium amount of serosanguineous drainage noted. There is large (67-100%) red, pink granulation within the wound bed. There is a small (1-33%) amount of necrotic tissue within the wound bed including Adherent Slough. Wound #8 status is Open. Original cause of wound was Gradually Appeared. The date acquired was: 03/24/2021. The wound has been in treatment 1 weeks. The wound is located on the Left,Lateral Lower Leg. The wound measures 1cm length x 1.8cm width x 0.1cm depth; 1.414cm^2 area and 0.141cm^3 volume. There is Fat Layer (Subcutaneous Tissue) exposed. There is no tunneling or undermining noted. There is a medium amount of serous drainage noted. There is large (67-100%) red granulation within the wound bed. There is a small (1-33%) amount of necrotic tissue within the wound bed including Adherent Slough. Assessment Active Problems ICD-10 Cellulitis of right lower limb AARIT, KASHUBA. 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 There was a Three Layer Compression Therapy Procedure by Dolan Amen, RN. Post procedure Diagnosis Wound #6: Same as Pre-Procedure Notes: R. 1.09 L. 1.12. Wound #7 Pre-procedure diagnosis of Wound #7 is a Lymphedema located on the Left,Distal,Midline Lower Leg . There was a Three Layer Compression Therapy Procedure by Dolan Amen, RN. Post procedure Diagnosis Wound #7: Same as Pre-Procedure Notes: R. 1.09 L. 1.12. 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 There was a Three Layer Compression Therapy Procedure by Dolan Amen, RN. Post procedure Diagnosis Wound #8: Same  as Pre-Procedure Notes: R. 1.09 L. 1.12. Plan Follow-up Appointments: Return Appointment in 1 week. Nurse Visit as needed Bathing/ Shower/ Hygiene: May shower with wound dressing protected with water repellent cover or cast protector. No tub bath. Edema Control - Lymphedema / Segmental Compressive Device / Other: Optional: One layer of unna paste to top of compression wrap (to act as an anchor). Elevate, Exercise Daily and Avoid Standing for Long Periods of Time. Elevate legs to the level of the heart and pump ankles as often as possible Elevate leg(s) parallel to the floor when sitting. Additional Orders / Instructions: Follow Nutritious Diet and Increase Protein Intake WOUND #6: - Lower Leg Wound Laterality: Right, Midline Cleanser: Soap and Water 1 x Per Week/30 Days Discharge Instructions: Gently cleanse wound with antibacterial soap, rinse and pat dry prior to dressing wounds Peri-Wound Care: Moisturizing Lotion 1 x Per Week/30 Days Discharge Instructions: Suggestions: Theraderm, Eucerin, Cetaphil, or patient preference. Peri-Wound Care: Triamcinolone Acetonide Cream, 0.1%, 15 (g) tube 1 x Per Week/30 Days Primary Dressing: Silvercel Small 2x2 (in/in) 1 x Per Week/30 Days Discharge Instructions: Apply Silvercel Small 2x2 (in/in) as instructed Secondary Dressing: ABD Pad 5x9 (in/in) 1 x Per Week/30 Days Discharge Instructions: Cover with ABD pad Compression Wrap: Profore Lite LF 3 Multilayer Compression Bandaging System 1 x Per Week/30 Days Discharge Instructions: Apply 3 multi-layer wrap as prescribed. WOUND #7: - Lower Leg Wound Laterality: Left, Midline, Distal Cleanser: Soap and Water 1 x Per Week/30 Days Discharge Instructions: Gently cleanse wound with  antibacterial soap, rinse and pat dry prior to dressing wounds Peri-Wound Care: Moisturizing Lotion 1 x Per Week/30 Days Discharge Instructions: Suggestions: Theraderm, Eucerin, Cetaphil, or patient preference. Peri-Wound Care: Triamcinolone Acetonide Cream, 0.1%, 15 (g) tube 1 x Per Week/30 Days Primary Dressing: Silvercel Small 2x2 (in/in) 1 x Per Week/30 Days Discharge Instructions: Apply Silvercel Small 2x2 (in/in) as instructed Secondary Dressing: ABD Pad 5x9 (in/in) 1 x Per Week/30 Days Discharge Instructions: Cover with ABD pad Compression Wrap: Profore Lite LF 3 Multilayer Compression Bandaging System 1 x Per Week/30 Days Discharge Instructions: Apply 3 multi-layer wrap as prescribed. WOUND #8: - Lower Leg Wound Laterality: Left, Lateral Cleanser: Soap and Water 1 x Per Week/30 Days VANN, OKERLUND ([id number]) Discharge Instructions: Gently cleanse wound with antibacterial soap, rinse and pat dry prior to dressing wounds Peri-Wound Care: Moisturizing Lotion 1 x Per Week/30 Days Discharge Instructions: Suggestions: Theraderm, Eucerin, Cetaphil, or patient preference. Peri-Wound Care: Triamcinolone Acetonide Cream, 0.1%, 15 (g) tube 1 x Per Week/30 Days Primary Dressing: Silvercel Small 2x2 (in/in) 1 x Per Week/30 Days Discharge Instructions: Apply Silvercel Small 2x2 (in/in) as instructed Secondary Dressing: ABD Pad 5x9 (in/in) 1 x Per Week/30 Days Discharge Instructions: Cover with ABD pad Compression Wrap: Profore Lite LF 3 Multilayer Compression Bandaging System 1 x Per Week/30 Days Discharge Instructions: Apply 3 multi-layer wrap as prescribed. 1. I would recommend that we continue currently with the wound care measures as before using silver cell to the open wound locations. 2. We will get a use triamcinolone to the legs in general to try to help with some of the dry skin and flaking mixed with moisturizing lotion. 3. I am also can recommend ABD pads to cover and then  subsequently the 3 layer compression wrap. We will see patient back for reevaluation in 1 week here in the clinic. If anything worsens or changes patient will contact our office for additional recommendations. Electronic Signature(s) Signed: 04/10/2021 10:00:10 AM By: Worthy Keeler  PA-C Entered By: Worthy Keeler on 04/10/2021 10:00:10 [name] ([id number]) -------------------------------------------------------------------------------- SuperBill Details Patient Name: JAFETH, MUSTIN. Date of Service: 04/10/2021 Medical Record Number: [id number] Patient Account Number: [id number] Date of Birth/Sex: 09/26/1946 (75 y.o. M) Treating RN: Dolan Amen Primary Care Provider: Fulton Reek Other Clinician: Referring Provider: Fulton Reek Treating Provider/Extender: Skipper Cliche in Treatment: 28 Diagnosis Coding ICD-10 Codes Code Description L03.115 Cellulitis of right lower limb L97.812 Non-pressure chronic ulcer of other part of right lower leg with fat layer exposed L97.822 Non-pressure chronic ulcer of other part of left lower leg with fat layer exposed E11.622 Type 2 diabetes mellitus with other skin ulcer Z95.4 Presence of other heart-valve replacement Z79.01 Long term (current) use of anticoagulants L84 Corns and callosities Facility Procedures CPT4: Description Modifier Quantity Code [id number] BILATERAL: Application of multi-layer venous compression system; leg (below knee), including 1 ankle and foot. Physician Procedures CPT4 Code: 1031594 Description: 58592 - WC PHYS LEVEL 3 - EST PT Modifier: Quantity: 1 CPT4 Code: Description: ICD-10 Diagnosis Description L03.115 Cellulitis of right lower limb L97.812 Non-pressure chronic ulcer of other part of right lower leg with fat la L97.822 Non-pressure chronic ulcer of other part of left lower leg with fat lay E11.622 Type  2 diabetes mellitus with other skin ulcer Modifier: yer exposed er  exposed Quantity: Electronic Signature(s) Signed: 04/10/2021 10:00:46 AM By: Worthy Keeler PA-C Entered By: Worthy Keeler on 04/10/2021 10:00:45

## 2021-04-10 NOTE — Progress Notes (Signed)
KAMAR, CALLENDER ([id number]) Visit Report for 04/10/2021 Arrival Information Details Patient Name: [name], [name]. Date of Service: 04/10/2021 8:30 AM Medical Record Number: [id number] Patient Account Number: [id number] Date of Birth/Sex: Jul 14, 1946 (75 y.o. M) Treating RN: Donnamarie Poag Primary Care Emileo Semel: Fulton Reek Other Clinician: Referring Athalia Setterlund: Fulton Reek Treating Oneka Parada/Extender: Skipper Cliche in Treatment: 28 Visit Information History Since Last Visit Added or deleted any medications: No Patient Arrived: [name] Had a fall or experienced change in No Arrival Time: 08:30 activities of daily living that may affect Accompanied By: self risk of falls: Transfer Assistance: None Hospitalized since last visit: No Patient Identification Verified: Yes Has Dressing in Place as Prescribed: Yes Secondary Verification Process Completed: Yes Pain Present Now: Yes Patient Requires Transmission-Based No Precautions: Patient Has Alerts: Yes Patient Alerts: Patient on Blood Thinner Type II Diabetic 81 mg Aspirin Warfarin ABI R1.09 TBI.88 07/19/20 ABI L1.12 TBI .94 07/19/20 COUMADIN Electronic Signature(s) Signed: 04/10/2021 2:03:19 PM By: Donnamarie Poag Entered By: Donnamarie Poag on 04/10/2021 08:30:59 [name] ([id number]) -------------------------------------------------------------------------------- Clinic Level of Care Assessment Details Patient Name: [name], [name]. Date of Service: 04/10/2021 8:30 AM Medical Record Number: [id number] Patient Account Number: [id number] Date of Birth/Sex: 04-27-1946 (75 y.o. M) Treating RN: Dolan Amen Primary Care Keyaria Lawson: Fulton Reek Other Clinician: Referring Coreena Rubalcava: Fulton Reek Treating Anne Boltz/Extender: Skipper Cliche in Treatment: 28 Clinic Level of Care Assessment Items TOOL 1 Quantity Score _0  - Use when EandM and Procedure is performed on INITIAL visit 0 ASSESSMENTS - Nursing Assessment  / Reassessment _1  - General Physical Exam (combine w/ comprehensive assessment (listed just below) when performed on new 0 pt. evals) _2  - 0 Comprehensive Assessment (HX, ROS, Risk Assessments, Wounds Hx, etc.) ASSESSMENTS - Wound and Skin Assessment / Reassessment _3  - Dermatologic / Skin Assessment (not related to wound area) 0 ASSESSMENTS - Ostomy and/or Continence Assessment and Care _4  - Incontinence Assessment and Management 0 _5  - 0 Ostomy Care Assessment and Management (repouching, etc.) PROCESS - Coordination of Care _6  - Simple Patient / Family Education for ongoing care 0 _7  - 0 Complex (extensive) Patient / Family Education for ongoing care _8  - 0 Staff obtains Programmer, systems, Records, Test Results / Process Orders _9  - 0 Staff telephones HHA, Nursing Homes / Clarify orders / etc _10  - 0 Routine Transfer to another Facility (non-emergent condition) _11  - 0 Routine Hospital Admission (non-emergent condition) _12  - 0 New Admissions / Biomedical engineer / Ordering NPWT, Apligraf, etc. _13  - 0 Emergency Hospital Admission (emergent condition) PROCESS - Special Needs _14  - Pediatric / Minor Patient Management 0 _15  - 0 Isolation Patient Management _16  - 0 Hearing / Language / Visual special needs _17  - 0 Assessment of Community assistance (transportation, D/C planning, etc.) _18  - 0 Additional assistance / Altered mentation _19  - 0 Support Surface(s) Assessment (bed, cushion, seat, etc.) INTERVENTIONS - Miscellaneous _20  - External ear exam 0 _21  - 0 Patient Transfer (multiple staff / Civil Service fast streamer / Similar devices) _22  - 0 Simple Staple / Suture removal (25 or less) _23  - 0 Complex Staple / Suture removal (26 or more) _24  - 0 Hypo/Hyperglycemic Management (do not check if billed separately) _25  - 0 Ankle / Brachial Index (ABI) - do not check if billed separately Has the patient been seen at the hospital within the last three years: Yes Total Score: 0 Level Of Care:  ____ [name] ([id number]) Electronic Signature(s) Signed: 04/10/2021 4:26:32 PM By: Charlett Nose RN Entered By: Laurin Coder  Staci Acosta on 04/10/2021 09:07:03 [name], [name] ([id number]) -------------------------------------------------------------------------------- Compression Therapy Details Patient Name: [name], [name]. Date of Service: 04/10/2021 8:30 AM Medical Record Number: [id number] Patient Account Number: [id number] Date of Birth/Sex: [birth year] (75 y.o. M) Treating RN: Dolan Amen Primary Care Patt Steinhardt: Fulton Reek Other Clinician: Referring Orry Sigl: Fulton Reek Treating Sandi Towe/Extender: Skipper Cliche in Treatment: 28 Compression Therapy Performed for Wound Assessment: Wound #6 Right,Midline Lower Leg Performed By: Cora Daniels, RN Compression Type: Three Layer Post Procedure Diagnosis Same as Pre-procedure Notes R. 1.09 L. 1.12 Electronic Signature(s) Signed: 04/10/2021 4:26:32 PM By: Georges Mouse, Minus Breeding RN Entered By: Georges Mouse, Kenia on 04/10/2021 09:04:51 [name] ([id number]) -------------------------------------------------------------------------------- Compression Therapy Details Patient Name: [name], [name]. Date of Service: 04/10/2021 8:30 AM Medical Record Number: [id number] Patient Account Number: [id number] Date of Birth/Sex: 1946/03/27 (75 y.o. M) Treating RN: Dolan Amen Primary Care Rendon Howell: Fulton Reek Other Clinician: Referring Neta Upadhyay: Fulton Reek Treating Tyyonna Soucy/Extender: Skipper Cliche in Treatment: 28 Compression Therapy Performed for Wound Assessment: Wound #7 Left,Distal,Midline Lower Leg Performed By: Cora Daniels, RN Compression Type: Three Layer Post Procedure Diagnosis Same as Pre-procedure Notes R. 1.09 L. 1.12 Electronic Signature(s) Signed: 04/10/2021 4:26:32 PM By: Georges Mouse, Minus Breeding RN Entered By: Georges Mouse, Kenia on  04/10/2021 09:04:51 [name] ([id number]) -------------------------------------------------------------------------------- Compression Therapy Details Patient Name: [name], [name]. Date of Service: 04/10/2021 8:30 AM Medical Record Number: [id number] Patient Account Number: [id number] Date of Birth/Sex: 1945-12-14 (75 y.o. M) Treating RN: Dolan Amen Primary Care Sonita Michiels: Fulton Reek Other Clinician: Referring Carolynne Schuchard: Fulton Reek Treating Alura Olveda/Extender: Skipper Cliche in Treatment: 28 Compression Therapy Performed for Wound Assessment: Wound #8 Left,Lateral Lower Leg Performed By: Cora Daniels, RN Compression Type: Three Layer Post Procedure Diagnosis Same as Pre-procedure Notes R. 1.09 L. 1.12 Electronic Signature(s) Signed: 04/10/2021 4:26:32 PM By: Georges Mouse, Minus Breeding RN Entered By: Georges Mouse, Minus Breeding on 04/10/2021 09:04:51 [name] ([id number]) -------------------------------------------------------------------------------- Encounter Discharge Information Details Patient Name: [name], [name]. Date of Service: 04/10/2021 8:30 AM Medical Record Number: [id number] Patient Account Number: [id number] Date of Birth/Sex: 1946-09-30 (75 y.o. M) Treating RN: Donnamarie Poag Primary Care Andreia Gandolfi: Fulton Reek Other Clinician: Referring Sulema Braid: Fulton Reek Treating Diedre Maclellan/Extender: Skipper Cliche in Treatment: 28 Encounter Discharge Information Items Discharge Condition: Stable Ambulatory Status: Cane Discharge Destination: Home Transportation: Private Auto Accompanied By: self Schedule Follow-up Appointment: Yes Clinical Summary of Care: Electronic Signature(s) Signed: 04/10/2021 2:03:19 PM By: Donnamarie Poag Entered By: Donnamarie Poag on 04/10/2021 09:30:13 [name] ([id number]) -------------------------------------------------------------------------------- Lower Extremity Assessment Details Patient  Name: [name], [name]. Date of Service: 04/10/2021 8:30 AM Medical Record Number: [id number] Patient Account Number: [id number] Date of Birth/Sex: [birth year] (75 y.o. M) Treating RN: Donnamarie Poag Primary Care Jaelani Posa: Fulton Reek Other Clinician: Referring Prescious Hurless: Fulton Reek Treating Shyniece Scripter/Extender: Skipper Cliche in Treatment: 28 Edema Assessment Assessed: [Left: Yes] Patrice Paradise: Yes] Edema: [Left: Yes] [Right: Yes] Calf Left: Right: Point of Measurement: 34 cm From Medial Instep 41.5 cm 39.5 cm Ankle Left: Right: Point of Measurement: 12 cm From Medial Instep 23.5 cm 24 cm Vascular Assessment Pulses: Dorsalis Pedis Palpable: [Left:Yes] [Right:Yes] Electronic Signature(s) Signed: 04/10/2021 2:03:19 PM By: Donnamarie Poag Entered By: Donnamarie Poag on 04/10/2021 08:44:49 [name] ([id number]) -------------------------------------------------------------------------------- Multi Wound Chart Details Patient Name: [name]. Date of Service: 04/10/2021 8:30 AM Medical Record Number: [id number] Patient Account Number: [id number] Date of Birth/Sex: 1946-05-04 (75 y.o. M) Treating RN: Dolan Amen Primary Care Sangeeta Youse: Fulton Reek  Other Clinician: Referring Gift Rueckert: Fulton Reek Treating Archana Eckman/Extender: Skipper Cliche in Treatment: 28 Vital Signs Height(in): 70 Pulse(bpm): 70 Weight(lbs): 29 Blood Pressure(mmHg): 143/72 Body Mass Index(BMI): 38 Temperature(F): 98.3 Respiratory Rate(breaths/min): 18 Photos: Wound Location: Right, Midline Lower Leg Left, Distal, Midline Lower Leg Left, Lateral Lower Leg Wounding Event: Gradually Appeared Gradually Appeared Gradually Appeared Primary Etiology: Lymphedema Lymphedema Venous Leg Ulcer Comorbid History: Cataracts, Arrhythmia, Coronary Cataracts, Arrhythmia, Coronary Cataracts, Arrhythmia, Coronary Artery Disease, Hypertension, Type Artery Disease, Hypertension, Type Artery Disease,  Hypertension, Type II Diabetes, Osteoarthritis, II Diabetes, Osteoarthritis, II Diabetes, Osteoarthritis, Neuropathy Neuropathy Neuropathy Date Acquired: 03/24/2021 03/24/2021 03/24/2021 Weeks of Treatment: _0 Wound Status: Open Open Open Clustered Wound: Yes No No Measurements L x W x D (cm) 12x10x0.1 1x1x0.1 1x1.8x0.1 Area (cm) : 94.248 0.785 1.414 Volume (cm) : 9.425 0.079 0.141 % Reduction in Area: 0.00% 75.00% -16.90% % Reduction in Volume: 0.00% 74.80% -16.50% Classification: Full Thickness Without Exposed Full Thickness Without Exposed Full Thickness Without Exposed Support Structures Support Structures Support Structures Exudate Amount: Medium Medium Medium Exudate Type: Serosanguineous Serosanguineous Serous Exudate Color: red, brown red, brown amber Granulation Amount: Small (1-33%) Large (67-100%) Large (67-100%) Granulation Quality: Red, Pink Red, Pink Red Necrotic Amount: Large (67-100%) Small (1-33%) Small (1-33%) Necrotic Tissue: Eschar, Adherent [place] Exposed Structures: Fat Layer (Subcutaneous Tissue): Fat Layer (Subcutaneous Tissue): Fat Layer (Subcutaneous Tissue): Yes Yes Yes Fascia: No Fascia: No Fascia: No Tendon: No Tendon: No Tendon: No Muscle: No Muscle: No Muscle: No Joint: No Joint: No Joint: No Bone: No Bone: No Bone: No Epithelialization: N/A Small (1-33%) Small (1-33%) Treatment Notes Electronic Signature(s) Signed: 04/10/2021 4:26:32 PM By: Georges Mouse, Minus Breeding RN Entered By: Georges Mouse, Minus Breeding on 04/10/2021 09:04:10 [name] ([id number]) [name], [name] ([id number]) -------------------------------------------------------------------------------- Multi-Disciplinary Care Plan Details Patient Name: [name], [name]. Date of Service: 04/10/2021 8:30 AM Medical Record Number: [id number] Patient Account Number: [id number] Date of Birth/Sex: June 14, 1946 (75 y.o. M) Treating RN: Dolan Amen Primary Care Chantry Headen: Fulton Reek Other Clinician: Referring Raha Tennison: Fulton Reek Treating Faiz Weber/Extender: Skipper Cliche in Treatment: 28 Active Inactive Wound/Skin Impairment Nursing Diagnoses: Impaired tissue integrity Goals: Ulcer/skin breakdown will have a volume reduction of 30% by week 4 Date Initiated: 09/24/2020 Target Resolution Date: 10/25/2020 Goal Status: Active Ulcer/skin breakdown will have a volume reduction of 50% by week 8 Date Initiated: 09/24/2020 Date Inactivated: 12/10/2020 Target Resolution Date: 11/25/2020 Goal Status: Met Ulcer/skin breakdown will have a volume reduction of 80% by week 12 Date Initiated: 09/24/2020 Date Inactivated: 12/26/2020 Target Resolution Date: 12/23/2020 Goal Status: Met Ulcer/skin breakdown will heal within 14 weeks Date Initiated: 09/24/2020 Date Inactivated: 12/26/2020 Target Resolution Date: 01/13/2021 Goal Status: Met Interventions: Assess patient/caregiver ability to obtain necessary supplies Assess patient/caregiver ability to perform ulcer/skin care regimen upon admission and as needed Treatment Activities: Referred to DME Daisia Slomski for dressing supplies : 09/24/2020 Skin care regimen initiated : 09/24/2020 Topical wound management initiated : 09/24/2020 Notes: Electronic Signature(s) Signed: 04/10/2021 4:26:32 PM By: Georges Mouse, Minus Breeding RN Entered By: Georges Mouse, Minus Breeding on 04/10/2021 09:04:03 [name] ([id number]) -------------------------------------------------------------------------------- Pain Assessment Details Patient Name: [name], [name]. Date of Service: 04/10/2021 8:30 AM Medical Record Number: [id number] Patient Account Number: [id number] Date of Birth/Sex: May 23, 1946 (75 y.o. M) Treating RN: Donnamarie Poag Primary Care Dannie Hattabaugh: Fulton Reek Other Clinician: Referring Tyrese Capriotti: Fulton Reek Treating Destynee Stringfellow/Extender: Skipper Cliche in Treatment: 28 Active  Problems Location of Pain Severity and Description of Pain Patient Has Paino  Yes Site Locations Pain Location: Pain in Ulcers Rate the pain. Current Pain Level: 5 Pain Management and Medication Current Pain Management: Electronic Signature(s) Signed: 04/10/2021 2:03:19 PM By: Donnamarie Poag Entered By: Donnamarie Poag on 04/10/2021 08:33:34 [name] ([id number]) -------------------------------------------------------------------------------- Patient/Caregiver Education Details Patient Name: [name] Date of Service: 04/10/2021 8:30 AM Medical Record Number: [id number] Patient Account Number: [id number] Date of Birth/Gender: 1945/11/10 (75 y.o. M) Treating RN: Dolan Amen Primary Care Physician: Fulton Reek Other Clinician: Referring Physician: Fulton Reek Treating Physician/Extender: Skipper Cliche in Treatment: 28 Education Assessment Education Provided To: Patient Education Topics Provided Wound/Skin Impairment: Methods: Explain/Verbal Responses: State content correctly Electronic Signature(s) Signed: 04/10/2021 4:26:32 PM By: Georges Mouse, Minus Breeding RN Entered By: Georges Mouse, Minus Breeding on 04/10/2021 09:07:19 [name] ([id number]) -------------------------------------------------------------------------------- Wound Assessment Details Patient Name: [name], [name]. Date of Service: 04/10/2021 8:30 AM Medical Record Number: [id number] Patient Account Number: [id number] Date of Birth/Sex: 17-Sep-1946 (75 y.o. M) Treating RN: Donnamarie Poag Primary Care Jessly Lebeck: Fulton Reek Other Clinician: Referring Meira Wahba: Fulton Reek Treating Nollie Terlizzi/Extender: Skipper Cliche in Treatment: 28 Wound Status Wound Number: 6 Primary Lymphedema Etiology: Wound Location: Right, Midline Lower Leg Wound Open Wounding Event: Gradually Appeared Status: Date Acquired: 03/24/2021 Comorbid Cataracts, Arrhythmia, Coronary Artery Disease, Weeks Of  Treatment: 1 History: Hypertension, Type II Diabetes, Osteoarthritis, Clustered Wound: Yes Neuropathy Photos Wound Measurements Length: (cm) 12 Width: (cm) 10 Depth: (cm) 0.1 Area: (cm) 94.248 Volume: (cm) 9.425 % Reduction in Area: 0% % Reduction in Volume: 0% Tunneling: No Undermining: No Wound Description Classification: Full Thickness Without Exposed Support Structures Exudate Amount: Medium Exudate Type: Serosanguineous Exudate Color: red, brown Foul Odor After Cleansing: No Slough/Fibrino Yes Wound Bed Granulation Amount: Small (1-33%) Exposed Structure Granulation Quality: Red, Pink Fascia Exposed: No Necrotic Amount: Large (67-100%) Fat Layer (Subcutaneous Tissue) Exposed: Yes Necrotic Quality: Eschar, Adherent Slough Tendon Exposed: No Muscle Exposed: No Joint Exposed: No Bone Exposed: No Treatment Notes Wound #6 (Lower Leg) Wound Laterality: Right, Midline Cleanser Soap and Water Discharge Instruction: Gently cleanse wound with antibacterial soap, rinse and pat dry prior to dressing wounds Peri-Wound Care [name], [name] ([id number]) [place] Discharge Instruction: Suggestions: Derrill Memo, Cetaphil, or patient preference. Triamcinolone Acetonide Cream, 0.1%, 15 (g) tube Topical Primary Dressing Silvercel Small 2x2 (in/in) Discharge Instruction: Apply Silvercel Small 2x2 (in/in) as instructed Secondary Dressing ABD Pad 5x9 (in/in) Discharge Instruction: Cover with ABD pad Secured With Compression Wrap Profore Lite LF 3 Multilayer Compression Bandaging System Discharge Instruction: Apply 3 multi-layer wrap as prescribed. Compression Stockings Add-Ons Electronic Signature(s) Signed: 04/10/2021 8:47:31 AM By: Donnamarie Poag Entered By: Donnamarie Poag on 04/10/2021 08:47:31 LENWOOD, BALSAM ([id number]) -------------------------------------------------------------------------------- Wound Assessment Details Patient Name: JANES, COLEGROVE. Date of Service: 04/10/2021 8:30 AM Medical Record Number: [id number] Patient Account Number: [id number] Date of Birth/Sex: 1946/04/17 (75 y.o. M) Treating RN: Donnamarie Poag Primary Care Saesha Llerenas: Fulton Reek Other Clinician: Referring Tamura Lasky: Fulton Reek Treating Setareh Rom/Extender: Skipper Cliche in Treatment: 28 Wound Status Wound Number: 7 Primary Lymphedema Etiology: Wound Location: Left, Distal, Midline Lower Leg Wound Open Wounding Event: Gradually Appeared Status: Date Acquired: 03/24/2021 Comorbid Cataracts, Arrhythmia, Coronary Artery Disease, Weeks Of Treatment: 1 History: Hypertension, Type II Diabetes, Osteoarthritis, Clustered Wound: No Neuropathy Photos Wound Measurements Length: (cm) 1 Width: (cm) 1 Depth: (cm) 0.1 Area: (cm) 0.785 Volume: (cm) 0.079 % Reduction in Area: 75% % Reduction in Volume: 74.8% Epithelialization: Small (1-33%) Tunneling: No Undermining: No Wound Description Classification: Full Thickness Without Exposed Support  Structures Exudate Amount: Medium Exudate Type: Serosanguineous Exudate Color: red, brown Foul Odor After Cleansing: No Slough/Fibrino Yes Wound Bed Granulation Amount: Large (67-100%) Exposed Structure Granulation Quality: Red, Pink Fascia Exposed: No Necrotic Amount: Small (1-33%) Fat Layer (Subcutaneous Tissue) Exposed: Yes Necrotic Quality: Adherent Slough Tendon Exposed: No Muscle Exposed: No Joint Exposed: No Bone Exposed: No Treatment Notes Wound #7 (Lower Leg) Wound Laterality: Left, Midline, Distal Cleanser Soap and Water Discharge Instruction: Gently cleanse wound with antibacterial soap, rinse and pat dry prior to dressing wounds Peri-Wound Care [name], [name] ([id number]) [place] Discharge Instruction: Suggestions: Derrill Memo, Cetaphil, or patient preference. Triamcinolone Acetonide Cream, 0.1%, 15 (g) tube Topical Primary Dressing Silvercel Small 2x2  (in/in) Discharge Instruction: Apply Silvercel Small 2x2 (in/in) as instructed Secondary Dressing ABD Pad 5x9 (in/in) Discharge Instruction: Cover with ABD pad Secured With Compression Wrap Profore Lite LF 3 Multilayer Compression Bandaging System Discharge Instruction: Apply 3 multi-layer wrap as prescribed. Compression Stockings Add-Ons Electronic Signature(s) Signed: 04/10/2021 8:47:49 AM By: Donnamarie Poag Entered By: Donnamarie Poag on 04/10/2021 08:47:49 [name] ([id number]) -------------------------------------------------------------------------------- Wound Assessment Details Patient Name: WAVERLY, CHAVARRIA. Date of Service: 04/10/2021 8:30 AM Medical Record Number: [id number] Patient Account Number: [id number] Date of Birth/Sex: 1946-03-21 (75 y.o. M) Treating RN: Donnamarie Poag Primary Care Odie Edmonds: Fulton Reek Other Clinician: Referring Cliffard Hair: Fulton Reek Treating Willie Loy/Extender: Skipper Cliche in Treatment: 28 Wound Status Wound Number: 8 Primary Venous Leg Ulcer Etiology: Wound Location: Left, Lateral Lower Leg Wound Open Wounding Event: Gradually Appeared Status: Date Acquired: 03/24/2021 Comorbid Cataracts, Arrhythmia, Coronary Artery Disease, Weeks Of Treatment: 1 History: Hypertension, Type II Diabetes, Osteoarthritis, Clustered Wound: No Neuropathy Photos Wound Measurements Length: (cm) 1 Width: (cm) 1.8 Depth: (cm) 0.1 Area: (cm) 1.414 Volume: (cm) 0.141 % Reduction in Area: -16.9% % Reduction in Volume: -16.5% Epithelialization: Small (1-33%) Tunneling: No Undermining: No Wound Description Classification: Full Thickness Without Exposed Support Structures Exudate Amount: Medium Exudate Type: Serous Exudate Color: amber Foul Odor After Cleansing: No Slough/Fibrino Yes Wound Bed Granulation Amount: Large (67-100%) Exposed Structure Granulation Quality: Red Fascia Exposed: No Necrotic Amount: Small (1-33%) Fat Layer  (Subcutaneous Tissue) Exposed: Yes Necrotic Quality: Adherent Slough Tendon Exposed: No Muscle Exposed: No Joint Exposed: No Bone Exposed: No Treatment Notes Wound #8 (Lower Leg) Wound Laterality: Left, Lateral Cleanser Soap and Water Discharge Instruction: Gently cleanse wound with antibacterial soap, rinse and pat dry prior to dressing wounds Peri-Wound Care CICERO, NOY ([id number]) [place] Discharge Instruction: Suggestions: Derrill Memo, Cetaphil, or patient preference. Triamcinolone Acetonide Cream, 0.1%, 15 (g) tube Topical Primary Dressing Silvercel Small 2x2 (in/in) Discharge Instruction: Apply Silvercel Small 2x2 (in/in) as instructed Secondary Dressing ABD Pad 5x9 (in/in) Discharge Instruction: Cover with ABD pad Secured With Compression Wrap Profore Lite LF 3 Multilayer Compression Bandaging System Discharge Instruction: Apply 3 multi-layer wrap as prescribed. Compression Stockings Add-Ons Electronic Signature(s) Signed: 04/10/2021 8:48:09 AM By: Donnamarie Poag Entered By: Donnamarie Poag on 04/10/2021 08:48:08 [name] ([id number]) -------------------------------------------------------------------------------- Vitals Details Patient Name: [name] Date of Service: 04/10/2021 8:30 AM Medical Record Number: [id number] Patient Account Number: [id number] Date of Birth/Sex: Sep 08, 1946 (75 y.o. M) Treating RN: Donnamarie Poag Primary Care Mont Jagoda: Fulton Reek Other Clinician: Referring Lyon Dumont: Fulton Reek Treating Miangel Flom/Extender: Skipper Cliche in Treatment: 28 Vital Signs Time Taken: 08:33 Temperature (F): 98.3 Height (in): 70 Pulse (bpm): 70 Weight (lbs): 262 Respiratory Rate (breaths/min): 18 Body Mass Index (BMI): 37.6 Blood Pressure (mmHg): 143/72 Reference Range: 80 - 120 mg /  dl Electronic Signature(s) Signed: 04/10/2021 2:03:19 PM By: Donnamarie Poag Entered ByDonnamarie Poag on 04/10/2021 08:33:25

## 2021-04-18 ENCOUNTER — Encounter: Payer: Medicare HMO | Attending: Physician Assistant | Admitting: Physician Assistant

## 2021-04-18 ENCOUNTER — Other Ambulatory Visit: Payer: Self-pay

## 2021-04-18 DIAGNOSIS — G8929 Other chronic pain: Secondary | ICD-10-CM | POA: Diagnosis not present

## 2021-04-18 DIAGNOSIS — L97812 Non-pressure chronic ulcer of other part of right lower leg with fat layer exposed: Secondary | ICD-10-CM | POA: Diagnosis not present

## 2021-04-18 DIAGNOSIS — I890 Lymphedema, not elsewhere classified: Secondary | ICD-10-CM | POA: Diagnosis not present

## 2021-04-18 DIAGNOSIS — E1151 Type 2 diabetes mellitus with diabetic peripheral angiopathy without gangrene: Secondary | ICD-10-CM | POA: Diagnosis not present

## 2021-04-18 DIAGNOSIS — Z952 Presence of prosthetic heart valve: Secondary | ICD-10-CM | POA: Diagnosis not present

## 2021-04-18 DIAGNOSIS — E11622 Type 2 diabetes mellitus with other skin ulcer: Secondary | ICD-10-CM | POA: Insufficient documentation

## 2021-04-18 DIAGNOSIS — L97822 Non-pressure chronic ulcer of other part of left lower leg with fat layer exposed: Secondary | ICD-10-CM | POA: Diagnosis not present

## 2021-04-18 DIAGNOSIS — E1140 Type 2 diabetes mellitus with diabetic neuropathy, unspecified: Secondary | ICD-10-CM | POA: Insufficient documentation

## 2021-04-18 DIAGNOSIS — Z7901 Long term (current) use of anticoagulants: Secondary | ICD-10-CM | POA: Diagnosis not present

## 2021-04-18 NOTE — Progress Notes (Addendum)
DONEVAN, BILLER ([id number]) Visit Report for 04/18/2021 Chief Complaint Document Details Patient Name: [name], [name]. Date of Service: 04/18/2021 9:30 AM Medical Record Number: [id number] Patient Account Number: [id number] Date of Birth/Sex: April 25, 1946 (75 y.o. M) Treating RN: Carlene Coria Primary Care Provider: Fulton Reek Other Clinician: Referring Provider: Fulton Reek Treating Provider/Extender: Skipper Cliche in Treatment: 29 Information Obtained from: Patient Chief Complaint Right LE ulcers and cellulitis Electronic Signature(s) Signed: 04/18/2021 9:43:12 AM By: Worthy Keeler PA-C Entered By: Worthy Keeler on 04/18/2021 09:43:12 HAROUN, COTHAM ([id number]) -------------------------------------------------------------------------------- HPI Details Patient Name: [name], [name]. Date of Service: 04/18/2021 9:30 AM Medical Record Number: [id number] Patient Account Number: [id number] Date of Birth/Sex: 01-30-1946 (75 y.o. M) Treating RN: Carlene Coria Primary Care Provider: Fulton Reek Other Clinician: Referring Provider: Fulton Reek Treating Provider/Extender: Skipper Cliche in Treatment: 29 History of Present Illness HPI Description: 09/24/2020 on evaluation today patient presents today for a heel ulcer that he tells me has been present for about 2 years. He has been seeing podiatry and they have been attempting to manage this including what sounds to be a total contact cast, Unna boot, and just standard dressings otherwise as well. Most recently has been using triple antibiotic ointment. With that being said unfortunately despite everything he really has not had any significant improvement. He tells me that he cannot even really remember exactly how this began but he presumed it may have rubbed on his shoes or something of that nature. 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 He tells me that this does seem to help. Fortunately there is no signs of active infection at this time. His most recent hemoglobin A1c was 8.1 though he knows this was this year he cannot tell me the exact time. His fluid pills currently to help with some of the lower extremity edema although he does obviously have signs of venous stasis/lymphedema. Currently there is no evidence of active infection. No fevers, chills, nausea, vomiting, or diarrhea. Patient has had fairly recent ABIs which were performed on 07/19/2020 and revealed that he has normal findings in both the ankle and toe locations bilaterally. His ABI on the right was 1.09 on the left was 1.08 with a TBI on the right of 0.88 and on the left of 0.94. Triphasic flow was noted throughout. 10/08/2020 on evaluation today patient appears to be doing pretty well in regard to his left heel currently in fact this is doing a great job and seems to be healing quite nicely. Unfortunately on his right leg he had a pile of wood that actually fell on him injuring his right leg this is somewhat erythematous has me concerned little bit about cellulitis though there is not really a good area to culture at this point. 10/24/2020 upon evaluation today patient appears to be doing well with regard to his heel ulcer. He is showing signs of improvement which is great news. His right leg is completely healed. Overall I feel like he is doing excellent and there is no signs of infection. 11/07/2020 upon evaluation today patient appears to be doing well with regard to his heel ulcer. He tells me that last week when he was unable to  come in his wife actually thought that the wound was very close to closing if not closed. Then it began to "reopen again". I really  feel like what may have happened as the collagen may have dried over the wound bed and that because that misunderstanding with thinking that the wound was healing. With that being said I did not see it last week I do not know that for certain. Either way I feel like he is doing great today I see no signs of infection at this point. 11/26/2020 upon inspection today patient appears to be doing decently well in regard to his heel ulcer. He has been tolerating the dressing changes without complication. Fortunately there is no sign of active infection at this time. No fevers, chills, nausea, vomiting, or diarrhea. 12/10/2020 upon evaluation today patient appears to be doing fairly well in regard to the wound on his heel as well as what appears to be a new wound of the left first metatarsal head plantar aspect. This seems to be an area that was callus that has split as the patient tells me has been trying to walk on his toes more has probably where this came from. With that being said there does not appear to be signs of active infection which is great news. 12/17/2020 upon evaluation today patient actually appears to be making good progress currently. Fortunately there is no evidence of active infection at this time. Overall I feel like he is very close to complete closure. 12/26/2020 upon evaluation today patient appears to be doing excellent in regard to his wounds. In fact I am not certain that these are not even completely healed on initial inspection. Overall I am very pleased with where things stand today. Good news is that they are healed he is actually get ready to go out of town and that will be helpful as well as he will be a full part of the time. Readmission: 02/04/2021 upon evaluation today patient appears to be doing well at this point in regard to his left heel that I previously saw him for. Unfortunately he is having issues with his right lower extremity. He has significant wounds at this point  he also has erythema noted there is definite signs of cellulitis which is unfortunate. With that being said I think we do need to address this sooner rather than later. The good news is he did have a nice trip to the beach. He tells me that he had no issues during that time. 4/27; patient on Bactrim. Culture showed methicillin sensitive staph aureus therefore the Bactrim should be effective. 2 small areas on the right leg are healed the area on the mid aspect of the tibia almost 100% covered in a very adherent necrotic debris. We have been using silver alginate 02/20/2021 upon evaluation today patient appears to be doing well with regard to his wound. He is showing signs of improvement which is great news overall very pleased with where things stand today. No fevers, chills, nausea, vomiting, or diarrhea. 02/27/2021 upon evaluation today patient appears to be doing well with regard to his leg ulcer. He is tolerating the dressing changes and overall appears to be doing quite excellent. I am extremely pleased with where things stand and overall I think patient is making great progress. There is no sign of active infection at this time which is also great news. 03/06/2021 upon evaluation today patient appears to be doing excellent in regard to his wounds. In fact he appears  to be completely healed today based on what I am seeing. This is excellent news and overall I am extremely pleased with where he stands. Overall the patient is happy to hear this as well this has been quite sometime coming. Readmission: 04/01/2021 patient unfortunately returns for readmission today. He tells me that he has been wearing his compression socks on the right leg daily LEIGH, KAEDING ([id number]) and he does not really know what is going on and why his legs are doing what they are doing. We did therefore go ahead and probe deeper into exactly what has been going on with him today. Subsequently the patient tells me that when  he gets up and what we would call "first thing in the morning" are really 2 different things". He does not tend to sleep well so he tells me that he will often wake up at 3:00 in the morning. He will then potentially going into the living room to get in his chair where he may read a book for a little while and then potentially fall asleep back in his chair. He then subsequently wake up around 5:00 or so and then get up and in his words "putter around". This often will end with him proceeding at some point around 8 AM or 9 AM to putting on his compression socks. With that being said this means that anywhere from the 3:00 in the morning till roughly around 9:00 in the morning he has no compression on yet he is sitting in his recliner, walking around and up and about, and this is at least 5 to 6 hours of noncompressed time. That may be our issue here but I did not realize until we discussed this further today. Fortunately there does not appear to be any signs of active infection at this time which is great news. With that being said he has multiple wounds of the bilateral lower extremities. 04/10/2021 upon evaluation today patient appears to be doing well with regard to his legs for the most part. He does look like he had some injury where his wrap slid down nonetheless he did some "doctoring on them". I do feel like most of the areas honestly have cleared back up which is great news. There does not appear to be any signs of active infection at this time which is also great news. No fevers, chills, nausea, vomiting, or diarrhea. 04/18/2021 upon evaluation today patient appears to be doing well with regard to his wounds in fact he appears to be completely healed which is great news. Fortunately there does not appear to be any signs of active infection which is great news. No fevers, chills, nausea, vomiting, or diarrhea. Electronic Signature(s) Signed: 04/18/2021 9:59:56 AM By: Worthy Keeler PA-C Entered  By: Worthy Keeler on 04/18/2021 09:59:55 ERAN, MISTRY ([id number]) -------------------------------------------------------------------------------- Physical Exam Details Patient Name: XAYDEN, LINSEY Date of Service: 04/18/2021 9:30 AM Medical Record Number: [id number] Patient Account Number: [id number] Date of Birth/Sex: 1945-10-22 (75 y.o. M) Treating RN: Carlene Coria Primary Care Provider: Fulton Reek Other Clinician: Referring Provider: Fulton Reek Treating Provider/Extender: Skipper Cliche in Treatment: 53 Constitutional Well-nourished and well-hydrated in no acute distress. Respiratory normal breathing without difficulty. Psychiatric this patient is able to make decisions and demonstrates good insight into disease process. Alert and Oriented x 3. pleasant and cooperative. Notes Patient's wounds again showed signs of complete epithelization. I do not see any signs of infection which is great news as well and overall I  am extremely pleased with where things stand currently. No fevers, chills, nausea, vomiting, or diarrhea. Electronic Signature(s) Signed: 04/18/2021 10:00:18 AM By: Worthy Keeler PA-C Entered By: Worthy Keeler on 04/18/2021 10:00:18 [name] ([id number]) -------------------------------------------------------------------------------- Physician Orders Details Patient Name: KENDON, SEDENO. Date of Service: 04/18/2021 9:30 AM Medical Record Number: [id number] Patient Account Number: [id number] Date of Birth/Sex: 11-01-1945 (75 y.o. 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 Put garments on first thing when you wake up and remove them before bed. o Moisturize legs daily after removing compression garments. o Elevate, Exercise Daily and Avoid Standing for Long Periods of Time. o DO YOUR BEST to sleep in the bed at night. DO NOT sleep in your recliner. Long hours of sitting in a recliner leads to swelling of the legs and/or potential wounds on your backside. Follow-up Appointments o Other: - If needed. Electronic Signature(s) Signed: 04/18/2021 4:09:56 PM By: Worthy Keeler PA-C Signed: 05/05/2021 1:36:15 PM By: [name], BSN, RN, CWS, [name] RN, BSN Entered By: [name], BSN, RN, CWS, [name] on 04/18/2021 09:48:17 [name] ([id number]) -------------------------------------------------------------------------------- Problem List Details Patient Name: DONEVAN, BILLER. Date of Service: 04/18/2021 9:30 AM Medical Record Number: [id number] Patient Account Number: [id number] Date of Birth/Sex: [birth year] (75 y.o. M) Treating RN: Carlene Coria Primary Care Provider: Fulton Reek Other Clinician: Referring Provider: Fulton Reek Treating Provider/Extender: Skipper Cliche in Treatment: 29 Active Problems ICD-10 Encounter Code Description Active Date MDM Diagnosis L03.115 Cellulitis of right lower limb 02/04/2021 No Yes L97.812 Non-pressure chronic ulcer of other part of right lower leg with fat layer 10/08/2020 No Yes exposed L97.822 Non-pressure chronic ulcer of other part of left lower leg with fat layer 04/01/2021 No Yes exposed E11.622 Type 2 diabetes mellitus with other skin ulcer  02/04/2021 No Yes Z95.4 Presence of other heart-valve replacement 09/24/2020 No Yes Z79.01 Long term (current) use of anticoagulants 09/24/2020 No Yes L84 Corns and callosities 12/26/2020 No Yes Inactive Problems Resolved Problems ICD-10 Code Description Active Date Resolved Date E11.621 Type 2 diabetes mellitus with foot ulcer 09/24/2020 09/24/2020 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 Date of Service: 04/18/2021 9:30 AM Medical Record Number: [id number] Patient Account Number: [id number] Date of Birth/Sex: 05/30/1946 (75 y.o. M) Treating RN: Carlene Coria Primary Care Provider: Fulton Reek Other Clinician: Referring Provider: Fulton Reek Treating Provider/Extender: Skipper Cliche in Treatment: 29 Subjective Chief Complaint Information obtained from Patient Right LE ulcers and cellulitis History of Present Illness (HPI) 09/24/2020 on evaluation today patient presents today for a heel ulcer that he tells me has been present for about 2 years. He has been seeing podiatry and they have been attempting to  manage this including what sounds to be a total contact cast, Unna boot, and just standard dressings otherwise as well. Most recently has been using triple antibiotic ointment. With that being said unfortunately despite everything he really has not had any significant improvement. He tells me that he cannot even really remember exactly how this began but he presumed it may have rubbed on his shoes or something of that nature. With that being said he tells me that the other issues that he has majorly is the presence of a artificial heart valve from replacement as well  as being on long-term anticoagulant therapy because of this. 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 He tells me that this does seem to help. Fortunately there is no signs of active infection at this time. His most recent hemoglobin A1c was 8.1 though he knows this was this year he cannot tell me the exact time. His fluid pills currently to help with some of the lower extremity edema although he does obviously have signs of venous stasis/lymphedema. Currently there is no evidence of active infection. No fevers, chills, nausea, vomiting, or diarrhea. Patient has had fairly recent ABIs which were performed on 07/19/2020 and revealed that he has normal findings in both the ankle and toe locations bilaterally. His ABI on the right was 1.09 on the left was 1.08 with a TBI on the right of 0.88 and on the left of 0.94. Triphasic flow was noted throughout. 10/08/2020 on evaluation today patient appears to be doing pretty well in regard to his left heel currently in fact this is doing a great job and seems to be healing quite nicely. Unfortunately on his right leg he had a pile of wood that actually fell on him injuring his right leg this is somewhat erythematous has me concerned little bit about cellulitis though there is not really a good area to culture at this point. 10/24/2020 upon evaluation today patient appears to be doing well with regard to his heel ulcer. He is showing signs of improvement which is great news. His right leg is completely healed. Overall I feel like he is doing excellent and there is no signs of infection. 11/07/2020 upon evaluation today patient appears to be doing well with regard to his heel ulcer. He tells me that last week when he was unable to come in his wife actually thought that the wound was very close to closing if not closed. Then it began to "reopen again". I really feel like what may have  happened as the collagen may have dried over the wound bed and that because that misunderstanding with thinking that the wound was healing. With that being said I did not see it last week I do not know that for certain. Either way I feel like he is doing great today I see no signs of infection at this point. 11/26/2020 upon inspection today patient appears to be doing decently well in regard to his heel ulcer. He has been tolerating the dressing changes without complication. Fortunately there is no sign of active infection at this time. No fevers, chills, nausea, vomiting, or diarrhea. 12/10/2020 upon evaluation today patient appears to be doing fairly well in regard to the wound on his heel as well as what appears to be a new wound of the left first metatarsal head plantar aspect. This seems to be an area that was callus that has split as the  patient tells me has been trying to walk on his toes more has probably where this came from. With that being said there does not appear to be signs of active infection which is great news. 12/17/2020 upon evaluation today patient actually appears to be making good progress currently. Fortunately there is no evidence of active infection at this time. Overall I feel like he is very close to complete closure. 12/26/2020 upon evaluation today patient appears to be doing excellent in regard to his wounds. In fact I am not certain that these are not even completely healed on initial inspection. Overall I am very pleased with where things stand today. Good news is that they are healed he is actually get ready to go out of town and that will be helpful as well as he will be a full part of the time. Readmission: 02/04/2021 upon evaluation today patient appears to be doing well at this point in regard to his left heel that I previously saw him for. Unfortunately he is having issues with his right lower extremity. He has significant wounds at this point he also has erythema  noted there is definite signs of cellulitis which is unfortunate. With that being said I think we do need to address this sooner rather than later. The good news is he did have a nice trip to the beach. He tells me that he had no issues during that time. 4/27; patient on Bactrim. Culture showed methicillin sensitive staph aureus therefore the Bactrim should be effective. 2 small areas on the right leg are healed the area on the mid aspect of the tibia almost 100% covered in a very adherent necrotic debris. We have been using silver alginate 02/20/2021 upon evaluation today patient appears to be doing well with regard to his wound. He is showing signs of improvement which is great news overall very pleased with where things stand today. No fevers, chills, nausea, vomiting, or diarrhea. 02/27/2021 upon evaluation today patient appears to be doing well with regard to his leg ulcer. He is tolerating the dressing changes and overall appears to be doing quite excellent. I am extremely pleased with where things stand and overall I think patient is making great progress. There is no sign of active infection at this time which is also great news. 03/06/2021 upon evaluation today patient appears to be doing excellent in regard to his wounds. In fact he appears to be completely healed today based on what I am seeing. This is excellent news and overall I am extremely pleased with where he stands. Overall the patient is happy to hear TERESA, LEMMERMAN ([id number]) this as well this has been quite sometime coming. Readmission: 04/01/2021 patient unfortunately returns for readmission today. He tells me that he has been wearing his compression socks on the right leg daily and he does not really know what is going on and why his legs are doing what they are doing. We did therefore go ahead and probe deeper into exactly what has been going on with him today. Subsequently the patient tells me that when he gets up and what  we would call "first thing in the morning" are really 2 different things". He does not tend to sleep well so he tells me that he will often wake up at 3:00 in the morning. He will then potentially going into the living room to get in his chair where he may read a book for a little while and then potentially fall asleep back in  his chair. He then subsequently wake up around 5:00 or so and then get up and in his words "putter around". This often will end with him proceeding at some point around 8 AM or 9 AM to putting on his compression socks. With that being said this means that anywhere from the 3:00 in the morning till roughly around 9:00 in the morning he has no compression on yet he is sitting in his recliner, walking around and up and about, and this is at least 5 to 6 hours of noncompressed time. That may be our issue here but I did not realize until we discussed this further today. Fortunately there does not appear to be any signs of active infection at this time which is great news. With that being said he has multiple wounds of the bilateral lower extremities. 04/10/2021 upon evaluation today patient appears to be doing well with regard to his legs for the most part. He does look like he had some injury where his wrap slid down nonetheless he did some "doctoring on them". I do feel like most of the areas honestly have cleared back up which is great news. There does not appear to be any signs of active infection at this time which is also great news. No fevers, chills, nausea, vomiting, or diarrhea. 04/18/2021 upon evaluation today patient appears to be doing well with regard to his wounds in fact he appears to be completely healed which is great news. Fortunately there does not appear to be any signs of active infection which is great news. No fevers, chills, nausea, vomiting, or diarrhea. Objective Constitutional Well-nourished and well-hydrated in no acute distress. Vitals Time Taken: 9:32  AM, Height: 70 in, Weight: 262 lbs, BMI: 37.6, Temperature: 98.4 F, Pulse: 81 bpm, Respiratory Rate: 16 breaths/min, Blood Pressure: 162/82 mmHg. Respiratory normal breathing without difficulty. Psychiatric this patient is able to make decisions and demonstrates good insight into disease process. Alert and Oriented x 3. pleasant and cooperative. General Notes: Patient's wounds again showed signs of complete epithelization. I do not see any signs of infection which is great news as well and overall I am extremely pleased with where things stand currently. No fevers, chills, nausea, vomiting, or diarrhea. Integumentary (Hair, Skin) Wound #6 status is Healed - Epithelialized. Original cause of wound was Gradually Appeared. The date acquired was: 03/24/2021. The wound has been in treatment 2 weeks. The wound is located on the Right,Midline Lower Leg. The wound measures 0cm length x 0cm width x 0cm depth; 0cm^2 area and 0cm^3 volume. There is a medium amount of serosanguineous drainage noted. There is no granulation within the wound bed. There is no necrotic tissue within the wound bed. Wound #7 status is Healed - Epithelialized. Original cause of wound was Gradually Appeared. The date acquired was: 03/24/2021. The wound has been in treatment 2 weeks. The wound is located on the Left,Distal,Midline Lower Leg. The wound measures 0cm length x 0cm width x 0cm depth; 0cm^2 area and 0cm^3 volume. There is a none present amount of drainage noted. There is no granulation within the wound bed. There is no necrotic tissue within the wound bed. Wound #8 status is Healed - Epithelialized. Original cause of wound was Gradually Appeared. The date acquired was: 03/24/2021. The wound has been in treatment 2 weeks. The wound is located on the Left,Lateral Lower Leg. The wound measures 0cm length x 0cm width x 0cm depth; 0cm^2 area and 0cm^3 volume. There is no tunneling or undermining noted.  There is a none present amount  of drainage noted. There is no granulation within the wound bed. There is no necrotic tissue within the wound bed. 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 Put garments on first thing when you wake up and remove them before bed. Moisturize legs daily after removing compression garments. Elevate, Exercise Daily and Avoid Standing for Long Periods of Time. DO YOUR BEST to sleep in the bed at night. DO NOT sleep in your recliner. Long hours of sitting in a recliner leads to swelling of the legs and/or potential wounds on your backside. Follow-up Appointments: Other: - If needed. 1. I would recommend currently that we going continue with the compression therapy. I am going to discontinue wound care services however as he is completely healed. 2. I am also can recommend the patient should both be wearing his compression socks as well as elevating. This is good to be of utmost importance to keep things from breaking down again. 3. I am also can recommend that the patient continue to sleep in the bed as opposed to in a recliner. This is good to help a lot as well as keeping edema under good control. We will see patient back for reevaluation in 1 week here in the clinic. If anything worsens or changes patient will contact our office for additional recommendations. Electronic Signature(s) Signed: 04/18/2021 10:18:19 AM By: Worthy Keeler PA-C Entered By: Worthy Keeler on 04/18/2021 10:18:18 SHERRY, ROGUS ([id number]) -------------------------------------------------------------------------------- SuperBill Details Patient Name: VOSHON, PETRO. Date of Service: 04/18/2021 Medical Record Number: [id number] Patient Account Number: [id number] Date of Birth/Sex: [birth year] (75 y.o. M) Treating RN: Carlene Coria Primary Care Provider: Fulton Reek Other Clinician: Referring Provider: Fulton Reek Treating Provider/Extender: Skipper Cliche in Treatment: 29 Diagnosis Coding ICD-10 Codes Code Description L03.115 Cellulitis of right lower limb L97.812 Non-pressure chronic ulcer of other part of right lower leg with fat layer exposed L97.822 Non-pressure chronic ulcer of other part of left lower leg with fat layer exposed E11.622 Type 2 diabetes mellitus with other skin ulcer Z95.4 Presence of other heart-valve replacement Z79.01 Long term (current) use of anticoagulants L84 Corns and callosities Facility Procedures CPT4 Code: 12458099 Description: [phone] - WOUND CARE VISIT-LEV 2 EST PT Modifier: Quantity: 1 Physician Procedures CPT4 Code: 5053976 Description: 99213 - WC PHYS LEVEL 3 - EST PT Modifier: Quantity: 1 CPT4 Code: Description: ICD-10 Diagnosis Description L03.115 Cellulitis of right lower limb L97.812 Non-pressure chronic ulcer of other part of right lower leg with fat la L97.822 Non-pressure chronic ulcer of other part of left lower leg with fat lay E11.622 Type  2 diabetes mellitus with other skin ulcer Modifier: yer exposed er exposed Quantity: Electronic Signature(s) Signed: 04/18/2021 10:20:18 AM By: Worthy Keeler PA-C Entered By: Worthy Keeler on 04/18/2021 10:20:18

## 2021-05-05 NOTE — Progress Notes (Signed)
ANSELMO, REIHL ([id number]) Visit Report for 04/18/2021 Arrival Information Details Patient Name: [name], [name]. Date of Service: 04/18/2021 9:30 AM Medical Record Number: [id number] Patient Account Number: [id number] Date of Birth/Sex: 08/23/1946 (75 y.o. M) Treating RN: [name] Primary Care Sade Hollon: Fulton Reek Other Clinician: Referring Renner Sebald: Fulton Reek Treating Harveen Flesch/Extender: Skipper Cliche in Treatment: 29 Visit Information History Since Last Visit Added or deleted any medications: No Patient Arrived: Cane Has Dressing in Place as Prescribed: Yes Arrival Time: 09:30 Has Compression in Place as Prescribed: Yes Accompanied By: self Pain Present Now: No Transfer Assistance: None Patient Identification Verified: Yes Secondary Verification Process Completed: Yes Patient Requires Transmission-Based No Precautions: Patient Has Alerts: Yes Patient Alerts: Patient on Blood Thinner Type II Diabetic 81 mg Aspirin Warfarin ABI R1.09 TBI.88 07/19/20 ABI L1.12 TBI .94 07/19/20 COUMADIN Electronic Signature(s) Signed: 05/05/2021 1:36:15 PM By: [name], BSN, RN, CWS, [name] RN, BSN Entered By: [name], BSN, RN, CWS, [name] on 04/18/2021 09:30:57 [name] ([id number]) -------------------------------------------------------------------------------- Clinic Level of Care Assessment Details Patient Name: [name]. Date of Service: 04/18/2021 9:30 AM Medical Record Number: [id number] Patient Account Number: [id number] Date of Birth/Sex: 11-28-1945 (75 y.o. M) Treating RN: [name] Primary Care Juana Haralson: Fulton Reek Other Clinician: Referring Tieshia Rettinger: Fulton Reek Treating Jaliel Deavers/Extender: Skipper Cliche in Treatment: 29 Clinic Level of Care Assessment Items TOOL 4 Quantity Score []  - Use when only an EandM is performed on FOLLOW-UP visit 0 ASSESSMENTS - Nursing Assessment / Reassessment X - Reassessment of Co-morbidities (includes updates  in patient status) 1 10 X- 1 5 Reassessment of Adherence to Treatment Plan ASSESSMENTS - Wound and Skin Assessment / Reassessment []  - Simple Wound Assessment / Reassessment - one wound 0 X- 3 5 Complex Wound Assessment / Reassessment - multiple wounds []  - 0 Dermatologic / Skin Assessment (not related to wound area) ASSESSMENTS - Focused Assessment X - Circumferential Edema Measurements - multi extremities 1 5 []  - 0 Nutritional Assessment / Counseling / Intervention []  - 0 Lower Extremity Assessment (monofilament, tuning fork, pulses) []  - 0 Peripheral Arterial Disease Assessment (using hand held doppler) ASSESSMENTS - Ostomy and/or Continence Assessment and Care []  - Incontinence Assessment and Management 0 []  - 0 Ostomy Care Assessment and Management (repouching, etc.) PROCESS - Coordination of Care X - Simple Patient / Family Education for ongoing care 1 15 []  - 0 Complex (extensive) Patient / Family Education for ongoing care []  - 0 Staff obtains Programmer, systems, Records, Test Results / Process Orders []  - 0 Staff telephones HHA, Nursing Homes / Clarify orders / etc []  - 0 Routine Transfer to another Facility (non-emergent condition) []  - 0 Routine Hospital Admission (non-emergent condition) []  - 0 New Admissions / Biomedical engineer / Ordering NPWT, Apligraf, etc. []  - 0 Emergency Hospital Admission (emergent condition) X- 1 10 Simple Discharge Coordination []  - 0 Complex (extensive) Discharge Coordination PROCESS - Special Needs []  - Pediatric / Minor Patient Management 0 []  - 0 Isolation Patient Management []  - 0 Hearing / Language / Visual special needs []  - 0 Assessment of Community assistance (transportation, D/C planning, etc.) []  - 0 Additional assistance / Altered mentation []  - 0 Support Surface(s) Assessment (bed, cushion, seat, etc.) INTERVENTIONS - Wound Cleansing / Measurement SHYAM, DAWSON ([id number]) []  - 0 Simple Wound Cleansing - one  wound []  - 0 Complex Wound Cleansing - multiple wounds []  - 0 Wound Imaging (photographs - any number of wounds) []  - 0 Wound Tracing (instead of photographs) []  -  0 Simple Wound Measurement - one wound []  - 0 Complex Wound Measurement - multiple wounds INTERVENTIONS - Wound Dressings []  - Small Wound Dressing one or multiple wounds 0 []  - 0 Medium Wound Dressing one or multiple wounds []  - 0 Large Wound Dressing one or multiple wounds []  - 0 Application of Medications - topical []  - 0 Application of Medications - injection INTERVENTIONS - Miscellaneous []  - External ear exam 0 []  - 0 Specimen Collection (cultures, biopsies, blood, body fluids, etc.) []  - 0 Specimen(s) / Culture(s) sent or taken to Lab for analysis []  - 0 Patient Transfer (multiple staff / Civil Service fast streamer / Similar devices) []  - 0 Simple Staple / Suture removal (25 or less) []  - 0 Complex Staple / Suture removal (26 or more) []  - 0 Hypo / Hyperglycemic Management (close monitor of Blood Glucose) []  - 0 Ankle / Brachial Index (ABI) - do not check if billed separately X- 1 5 Vital Signs Has the patient been seen at the hospital within the last three years: Yes Total Score: 65 Level Of Care: New/Established - Level 2 Electronic Signature(s) Signed: 05/05/2021 1:36:15 PM By: [name], BSN, RN, CWS, [name] RN, BSN Entered By: [name], BSN, RN, CWS, [name] on 04/18/2021 09:48:45 [name] ([id number]) -------------------------------------------------------------------------------- Encounter Discharge Information Details Patient Name: [name]. Date of Service: 04/18/2021 9:30 AM Medical Record Number: [id number] Patient Account Number: [id number] Date of Birth/Sex: 12-28-1945 (75 y.o. M) Treating RN: [name] Primary Care Carrie Usery: Fulton Reek Other Clinician: Referring Kyshon Tolliver: Fulton Reek Treating Lagina Reader/Extender: Skipper Cliche in Treatment: 29 Encounter Discharge Information  Items Discharge Condition: Stable Ambulatory Status: Cane Discharge Destination: Home Transportation: Private Auto Accompanied By: self Schedule Follow-up Appointment: No Clinical Summary of Care: Electronic Signature(s) Signed: 05/05/2021 1:36:15 PM By: [name], BSN, RN, CWS, [name] RN, BSN Entered By: [name], BSN, RN, CWS, [name] on 04/18/2021 09:49:40 [name] ([id number]) -------------------------------------------------------------------------------- Lower Extremity Assessment Details Patient Name: KENROY, TIMBERMAN. Date of Service: 04/18/2021 9:30 AM Medical Record Number: [id number] Patient Account Number: [id number] Date of Birth/Sex: 11/25/1945 (75 y.o. M) Treating RN: [name] Primary Care Arieana Somoza: Fulton Reek Other Clinician: Referring Burgundy Matuszak: Fulton Reek Treating Chasady Longwell/Extender: Skipper Cliche in Treatment: 29 Edema Assessment Assessed: [Left: No] [Right: No] [Left: Edema] [Right: :] Calf Left: Right: Point of Measurement: 34 cm From Medial Instep 41 cm 41 cm Ankle Left: Right: Point of Measurement: 12 cm From Medial Instep 23 cm 24 cm Vascular Assessment Pulses: Dorsalis Pedis Palpable: [Left:Yes] [Right:Yes] Electronic Signature(s) Signed: 05/05/2021 1:36:15 PM By: [name], BSN, RN, CWS, [name] RN, BSN Entered By: [name], BSN, RN, CWS, [name] on 04/18/2021 09:43:55 [name] ([id number]) -------------------------------------------------------------------------------- Multi Wound Chart Details Patient Name: TONNIE, FRIEDEL. Date of Service: 04/18/2021 9:30 AM Medical Record Number: [id number] Patient Account Number: [id number] Date of Birth/Sex: [birth year] (75 y.o. M) Treating RN: [name] Primary Care Keani Gotcher: Fulton Reek Other Clinician: Referring Enas Winchel: Fulton Reek Treating Mica Releford/Extender: Skipper Cliche in Treatment: 29 Vital Signs Height(in): 70 Pulse(bpm): 80 Weight(lbs): 45 Blood Pressure(mmHg): 162/82 Body  Mass Index(BMI): 38 Temperature(F): 98.4 Respiratory Rate(breaths/min): 16 Photos: Wound Location: Right, Midline Lower Leg Left, Distal, Midline Lower Leg Left, Lateral Lower Leg Wounding Event: Gradually Appeared Gradually Appeared Gradually Appeared Primary Etiology: Lymphedema Lymphedema Venous Leg Ulcer Comorbid History: Cataracts, Arrhythmia, Coronary Cataracts, Arrhythmia, Coronary Cataracts, Arrhythmia, Coronary Artery Disease, Hypertension, Type Artery Disease, Hypertension, Type Artery Disease, Hypertension, Type II Diabetes, Osteoarthritis, II Diabetes, Osteoarthritis, II Diabetes, Osteoarthritis, Neuropathy Neuropathy Neuropathy  Date Acquired: 03/24/2021 03/24/2021 03/24/2021 Weeks of Treatment: 2 2 2  Wound Status: Healed - Epithelialized Healed - Epithelialized Healed - Epithelialized Clustered Wound: Yes No No Measurements L x W x D (cm) 0x0x0 0x0x0 0x0x0 Area (cm) : 0 0 0 Volume (cm) : 0 0 0 % Reduction in Area: 100.00% 100.00% 100.00% % Reduction in Volume: 100.00% 100.00% 100.00% Classification: Full Thickness Without Exposed Full Thickness Without Exposed Full Thickness Without Exposed Support Structures Support Structures Support Structures Exudate Amount: Medium None Present None Present Exudate Type: Serosanguineous N/A N/A Exudate Color: red, brown N/A N/A Granulation Amount: None Present (0%) None Present (0%) None Present (0%) Necrotic Amount: None Present (0%) None Present (0%) None Present (0%) Exposed Structures: Fascia: No Fascia: No Fascia: No Fat Layer (Subcutaneous Tissue): Fat Layer (Subcutaneous Tissue): Fat Layer (Subcutaneous Tissue): No No No Tendon: No Tendon: No Tendon: No Muscle: No Muscle: No Muscle: No Joint: No Joint: No Joint: No Bone: No Bone: No Bone: No Epithelialization: Large (67-100%) Large (67-100%) Large (67-100%) Treatment Notes Electronic Signature(s) Signed: 05/05/2021 1:36:15 PM By: [name], BSN, RN, CWS, [name] RN,  BSN Entered By: [name], BSN, RN, CWS, [name] on 04/18/2021 09:47:32 [name] ([id number]) -------------------------------------------------------------------------------- Multi-Disciplinary Care Plan Details Patient Name: RAI, SEVERNS. Date of Service: 04/18/2021 9:30 AM Medical Record Number: [id number] Patient Account Number: [id number] Date of Birth/Sex: [birth year] (75 y.o. M) Treating RN: [name] Primary Care Trena Dunavan: Fulton Reek Other Clinician: Referring Toniyah Dilmore: Fulton Reek Treating Celia Friedland/Extender: Skipper Cliche in Treatment: 46 Active Inactive Electronic Signature(s) Signed: 05/05/2021 1:36:15 PM By: [name], BSN, RN, CWS, [name] RN, BSN Entered By: [name], BSN, RN, CWS, [name] on 04/18/2021 09:47:18 [name] ([id number]) -------------------------------------------------------------------------------- Pain Assessment Details Patient Name: BJORN, HALLAS. Date of Service: 04/18/2021 9:30 AM Medical Record Number: [id number] Patient Account Number: [id number] Date of Birth/Sex: [birth year] (75 y.o. M) Treating RN: [name] Primary Care Kalani Sthilaire: Fulton Reek Other Clinician: Referring Laurent Cargile: Fulton Reek Treating Mareena Cavan/Extender: Skipper Cliche in Treatment: 29 Active Problems Location of Pain Severity and Description of Pain Patient Has Paino No Site Locations Pain Management and Medication Current Pain Management: Electronic Signature(s) Signed: 05/05/2021 1:36:15 PM By: [name], BSN, RN, CWS, [name] RN, BSN Entered By: [name], BSN, RN, CWS, [name] on 04/18/2021 09:39:00 [name] ([id number]) -------------------------------------------------------------------------------- Patient/Caregiver Education Details Patient Name: TERRION, GENCARELLI. Date of Service: 04/18/2021 9:30 AM Medical Record Number: [id number] Patient Account Number: [id number] Date of Birth/Gender: [birth year] (74 y.o. M) Treating RN: [name] Primary Care  Physician: Fulton Reek Other Clinician: Referring Physician: Fulton Reek Treating Physician/Extender: Skipper Cliche in Treatment: 29 Education Assessment Education Provided To: Patient Education Topics Provided Venous: Handouts: Controlling Swelling with Compression Stockings Methods: Demonstration, Explain/Verbal Responses: State content correctly Electronic Signature(s) Signed: 05/05/2021 1:36:15 PM By: [name], BSN, RN, CWS, [name] RN, BSN Entered By: [name], BSN, RN, CWS, [name] on 04/18/2021 09:49:06 [name] ([id number]) -------------------------------------------------------------------------------- Wound Assessment Details Patient Name: MARSALIS, BEAULIEU. Date of Service: 04/18/2021 9:30 AM Medical Record Number: [id number] Patient Account Number: [id number] Date of Birth/Sex: 1946/06/26 (75 y.o. M) Treating RN: [name] Primary Care Raydan Schlabach: Fulton Reek Other Clinician: Referring Markevious Ehmke: Fulton Reek Treating Orvan Papadakis/Extender: Skipper Cliche in Treatment: 29 Wound Status Wound Number: 6 Primary Lymphedema Etiology: Wound Location: Right, Midline Lower Leg Wound Healed - Epithelialized Wounding Event: Gradually Appeared Status: Date Acquired: 03/24/2021 Comorbid Cataracts, Arrhythmia, Coronary Artery Disease, Weeks Of Treatment: 2 History: Hypertension, Type II Diabetes, Osteoarthritis, Clustered Wound: Yes Neuropathy Photos  Wound Measurements Length: (cm) 0 Width: (cm) 0 Depth: (cm) 0 Area: (cm) Volume: (cm) % Reduction in Area: 100% % Reduction in Volume: 100% Epithelialization: Large (67-100%) 0 0 Wound Description Classification: Full Thickness Without Exposed Support Structu Exudate Amount: Medium Exudate Type: Serosanguineous Exudate Color: red, brown res Foul Odor After Cleansing: No Slough/Fibrino Yes Wound Bed Granulation Amount: None Present (0%) Exposed Structure Necrotic Amount: None Present (0%) Fascia Exposed:  No Fat Layer (Subcutaneous Tissue) Exposed: No Tendon Exposed: No Muscle Exposed: No Joint Exposed: No Bone Exposed: No Electronic Signature(s) Signed: 05/05/2021 1:36:15 PM By: [name], BSN, RN, CWS, [name] RN, BSN Entered By: [name], BSN, RN, CWS, [name] on 04/18/2021 09:41:15 [name] ([id number]) -------------------------------------------------------------------------------- Wound Assessment Details Patient Name: JACCOB, CZAPLICKI. Date of Service: 04/18/2021 9:30 AM Medical Record Number: [id number] Patient Account Number: [id number] Date of Birth/Sex: 09/15/1946 (75 y.o. M) Treating RN: [name] Primary Care Safari Cinque: Fulton Reek Other Clinician: Referring Lareta Bruneau: Fulton Reek Treating Treyshawn Muldrew/Extender: Skipper Cliche in Treatment: 29 Wound Status Wound Number: 7 Primary Lymphedema Etiology: Wound Location: Left, Distal, Midline Lower Leg Wound Healed - Epithelialized Wounding Event: Gradually Appeared Status: Date Acquired: 03/24/2021 Comorbid Cataracts, Arrhythmia, Coronary Artery Disease, Weeks Of Treatment: 2 History: Hypertension, Type II Diabetes, Osteoarthritis, Clustered Wound: No Neuropathy Photos Wound Measurements Length: (cm) 0 Width: (cm) 0 Depth: (cm) 0 Area: (cm) 0 Volume: (cm) 0 % Reduction in Area: 100% % Reduction in Volume: 100% Epithelialization: Large (67-100%) Wound Description Classification: Full Thickness Without Exposed Support Structure Exudate Amount: None Present s Foul Odor After Cleansing: No Slough/Fibrino No Wound Bed Granulation Amount: None Present (0%) Exposed Structure Necrotic Amount: None Present (0%) Fascia Exposed: No Fat Layer (Subcutaneous Tissue) Exposed: No Tendon Exposed: No Muscle Exposed: No Joint Exposed: No Bone Exposed: No Electronic Signature(s) Signed: 05/05/2021 1:36:15 PM By: [name], BSN, RN, CWS, [name] RN, BSN Entered By: [name], BSN, RN, CWS, [name] on 04/18/2021 09:41:43 [name]  ([id number]) -------------------------------------------------------------------------------- Wound Assessment Details Patient Name: CANUTO, KINGSTON. Date of Service: 04/18/2021 9:30 AM Medical Record Number: [id number] Patient Account Number: [id number] Date of Birth/Sex: 06/07/1946 (74 y.o. M) Treating RN: [name] Primary Care Azrael Huss: Fulton Reek Other Clinician: Referring Issabelle Mcraney: Fulton Reek Treating Isola Mehlman/Extender: Skipper Cliche in Treatment: 29 Wound Status Wound Number: 8 Primary Venous Leg Ulcer Etiology: Wound Location: Left, Lateral Lower Leg Wound Healed - Epithelialized Wounding Event: Gradually Appeared Status: Date Acquired: 03/24/2021 Comorbid Cataracts, Arrhythmia, Coronary Artery Disease, Weeks Of Treatment: 2 History: Hypertension, Type II Diabetes, Osteoarthritis, Clustered Wound: No Neuropathy Photos Wound Measurements Length: (cm) 0 Width: (cm) 0 Depth: (cm) 0 Area: (cm) 0 Volume: (cm) 0 % Reduction in Area: 100% % Reduction in Volume: 100% Epithelialization: Large (67-100%) Tunneling: No Undermining: No Wound Description Classification: Full Thickness Without Exposed Support Structure Exudate Amount: None Present s Foul Odor After Cleansing: No Slough/Fibrino No Wound Bed Granulation Amount: None Present (0%) Exposed Structure Necrotic Amount: None Present (0%) Fascia Exposed: No Fat Layer (Subcutaneous Tissue) Exposed: No Tendon Exposed: No Muscle Exposed: No Joint Exposed: No Bone Exposed: No Electronic Signature(s) Signed: 05/05/2021 1:36:15 PM By: [name], BSN, RN, CWS, [name] RN, BSN Entered By: [name], BSN, RN, CWS, [name] on 04/18/2021 09:42:12 [name] ([id number]) -------------------------------------------------------------------------------- [name] Details Patient Name: CELSO, GRANJA. Date of Service: 04/18/2021 9:30 AM Medical Record Number: [id number] Patient Account Number: [id number] Date of Birth/Sex:  [birth year] (75 y.o. M) Treating RN: [name] Primary Care Rhonin Trott: Fulton Reek Other Clinician: Referring Skylar Flynt:  Fulton Reek Treating Duvan Mousel/Extender: Jeri Cos Weeks in Treatment: 29 Vital Signs Time Taken: 09:32 Temperature (F): 98.4 Height (in): 70 Pulse (bpm): 81 Weight (lbs): 262 Respiratory Rate (breaths/min): 16 Body Mass Index (BMI): 37.6 Blood Pressure (mmHg): 162/82 Reference Range: 80 - 120 mg / dl Electronic Signature(s) Signed: 05/05/2021 1:36:15 PM By: [name], BSN, RN, CWS, [name] RN, BSN Entered By: [name], BSN, RN, CWS, [name] on 04/18/2021 09:32:26

## 2021-07-28 ENCOUNTER — Other Ambulatory Visit: Payer: Self-pay

## 2021-07-28 ENCOUNTER — Encounter: Payer: Medicare HMO | Attending: Internal Medicine | Admitting: Internal Medicine

## 2021-07-28 DIAGNOSIS — L97421 Non-pressure chronic ulcer of left heel and midfoot limited to breakdown of skin: Secondary | ICD-10-CM | POA: Diagnosis not present

## 2021-07-28 DIAGNOSIS — E11621 Type 2 diabetes mellitus with foot ulcer: Secondary | ICD-10-CM | POA: Insufficient documentation

## 2021-07-28 DIAGNOSIS — E1142 Type 2 diabetes mellitus with diabetic polyneuropathy: Secondary | ICD-10-CM | POA: Insufficient documentation

## 2021-07-28 DIAGNOSIS — L97429 Non-pressure chronic ulcer of left heel and midfoot with unspecified severity: Secondary | ICD-10-CM | POA: Diagnosis present

## 2021-07-28 NOTE — Progress Notes (Signed)
RADEK, CARNERO ([id number]) Visit Report for 07/28/2021 Debridement Details Patient Name: [name], [name]. Date of Service: 07/28/2021 10:15 AM Medical Record Number: [id number] Patient Account Number: [id number] Date of Birth/Sex: October 24, 1945 (75 y.o. M) Treating RN: Donnamarie Poag Primary Care Provider: Fulton Reek Other Clinician: Referring Provider: Fulton Reek Treating Provider/Extender: Tito Dine in Treatment: 0 Debridement Performed for Wound #9 Left Calcaneus Assessment: Performed By: Physician Ricard Dillon, MD Debridement Type: Debridement Severity of Tissue Pre Debridement: Fat layer exposed Level of Consciousness (Pre- Awake and Alert procedure): Pre-procedure Verification/Time Out Yes - 10:51 Taken: Start Time: 10:51 Pain Control: Lidocaine Total Area Debrided (L x W): 2 (cm) x 1.4 (cm) = 2.8 (cm) Tissue and other material Non-Viable, Eschar, Skin: Dermis debrided: Level: Skin/Dermis Debridement Description: Selective/Open Wound Instrument: Curette Bleeding: Minimum Hemostasis Achieved: Pressure Response to Treatment: Procedure was tolerated well Level of Consciousness (Post- Awake and Alert procedure): Post Debridement Measurements of Total Wound Length: (cm) 2 Width: (cm) 1.4 Depth: (cm) 0.1 Volume: (cm) 0.22 Character of Wound/Ulcer Post Debridement: Improved Severity of Tissue Post Debridement: Fat layer exposed Post Procedure Diagnosis Same as Pre-procedure Electronic Signature(s) Signed: 07/28/2021 3:42:16 PM By: Donnamarie Poag Signed: 07/28/2021 5:14:01 PM By: Linton Ham MD Entered By: Linton Ham on 07/28/2021 11:03:27 [name] ([id number]) -------------------------------------------------------------------------------- HPI Details Patient Name: [name], [name]. Date of Service: 07/28/2021 10:15 AM Medical Record Number: [id number] Patient Account Number: [id number] Date of Birth/Sex: 04-Apr-1946  (75 y.o. M) Treating RN: Donnamarie Poag Primary Care Provider: Fulton Reek Other Clinician: Referring Provider: Fulton Reek Treating Provider/Extender: Tito Dine in Treatment: 0 History of Present Illness HPI Description: 09/24/2020 on evaluation today patient presents today for a heel ulcer that he tells me has been present for about 2 years. He has been seeing podiatry and they have been attempting to manage this including what sounds to be a total contact cast, Unna boot, and just standard dressings otherwise as well. Most recently has been using triple antibiotic ointment. With that being said unfortunately despite everything he really has not had any significant improvement. He tells me that he cannot even really remember exactly how this began but he presumed it may have rubbed on his shoes or something of that nature. 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 He tells me that this does seem to help. Fortunately there is no signs of active infection at this time. His most recent hemoglobin A1c was 8.1 though he knows this was this year he cannot tell me the exact time. His fluid pills currently to help with some of the lower extremity edema although he does obviously have signs of venous stasis/lymphedema. Currently there is no evidence of active infection. No fevers, chills, nausea, vomiting, or diarrhea. Patient has had fairly recent ABIs which were performed on 07/19/2020 and revealed that he has normal findings in both the ankle and toe locations bilaterally. His ABI on the right was 1.09 on the left was 1.08 with a TBI on the right of 0.88 and on the left of 0.94. Triphasic flow was noted  throughout. 10/08/2020 on evaluation today patient appears to be doing pretty well in regard to his left heel currently in fact  this is doing a great job and seems to be healing quite nicely. Unfortunately on his right leg he had a pile of wood that actually fell on him injuring his right leg this is somewhat erythematous has me concerned little bit about cellulitis though there is not really a good area to culture at this point. 10/24/2020 upon evaluation today patient appears to be doing well with regard to his heel ulcer. He is showing signs of improvement which is great news. His right leg is completely healed. Overall I feel like he is doing excellent and there is no signs of infection. 11/07/2020 upon evaluation today patient appears to be doing well with regard to his heel ulcer. He tells me that last week when he was unable to come in his wife actually thought that the wound was very close to closing if not closed. Then it began to "reopen again". I really feel like what may have happened as the collagen may have dried over the wound bed and that because that misunderstanding with thinking that the wound was healing. With that being said I did not see it last week I do not know that for certain. Either way I feel like he is doing great today I see no signs of infection at this point. 11/26/2020 upon inspection today patient appears to be doing decently well in regard to his heel ulcer. He has been tolerating the dressing changes without complication. Fortunately there is no sign of active infection at this time. No fevers, chills, nausea, vomiting, or diarrhea. 12/10/2020 upon evaluation today patient appears to be doing fairly well in regard to the wound on his heel as well as what appears to be a new wound of the left first metatarsal head plantar aspect. This seems to be an area that was callus that has split as the patient tells me has been trying to walk on his toes more has probably where this  came from. With that being said there does not appear to be signs of active infection which is great news. 12/17/2020 upon evaluation today patient actually appears to be making good progress currently. Fortunately there is no evidence of active infection at this time. Overall I feel like he is very close to complete closure. 12/26/2020 upon evaluation today patient appears to be doing excellent in regard to his wounds. In fact I am not certain that these are not even completely healed on initial inspection. Overall I am very pleased with where things stand today. Good news is that they are healed he is actually get ready to go out of town and that will be helpful as well as he will be a full part of the time. Readmission: 02/04/2021 upon evaluation today patient appears to be doing well at this point in regard to his left heel that I previously saw him for. Unfortunately he is having issues with his right lower extremity. He has significant wounds at this point he also has erythema noted there is definite signs of cellulitis which is unfortunate. With that being said I think we do need to address this sooner rather than later. The good news is he did have a nice trip to the beach. He tells me that he had no issues during that time. 4/27; patient on Bactrim. Culture showed methicillin sensitive staph aureus therefore the Bactrim should be effective. 2 small areas on the right leg are healed the area on the mid aspect of the tibia almost 100% covered in a very  adherent necrotic debris. We have been using silver alginate 02/20/2021 upon evaluation today patient appears to be doing well with regard to his wound. He is showing signs of improvement which is great news overall very pleased with where things stand today. No fevers, chills, nausea, vomiting, or diarrhea. 02/27/2021 upon evaluation today patient appears to be doing well with regard to his leg ulcer. He is tolerating the dressing changes and  overall appears to be doing quite excellent. I am extremely pleased with where things stand and overall I think patient is making great progress. There is no sign of active infection at this time which is also great news. 03/06/2021 upon evaluation today patient appears to be doing excellent in regard to his wounds. In fact he appears to be completely healed today based on what I am seeing. This is excellent news and overall I am extremely pleased with where he stands. Overall the patient is happy to hear this as well this has been quite sometime coming. Readmission: 04/01/2021 patient unfortunately returns for readmission today. He tells me that he has been wearing his compression socks on the right leg daily [name], [name] ([id number]) and he does not really know what is going on and why his legs are doing what they are doing. We did therefore go ahead and probe deeper into exactly what has been going on with him today. Subsequently the patient tells me that when he gets up and what we would call "first thing in the morning" are really 2 different things". He does not tend to sleep well so he tells me that he will often wake up at 3:00 in the morning. He will then potentially going into the living room to get in his chair where he may read a book for a little while and then potentially fall asleep back in his chair. He then subsequently wake up around 5:00 or so and then get up and in his words "putter around". This often will end with him proceeding at some point around 8 AM or 9 AM to putting on his compression socks. With that being said this means that anywhere from the 3:00 in the morning till roughly around 9:00 in the morning he has no compression on yet he is sitting in his recliner, walking around and up and about, and this is at least 5 to 6 hours of noncompressed time. That may be our issue here but I did not realize until we discussed this further today. Fortunately there does not  appear to be any signs of active infection at this time which is great news. With that being said he has multiple wounds of the bilateral lower extremities. 04/10/2021 upon evaluation today patient appears to be doing well with regard to his legs for the most part. He does look like he had some injury where his wrap slid down nonetheless he did some "doctoring on them". I do feel like most of the areas honestly have cleared back up which is great news. There does not appear to be any signs of active infection at this time which is also great news. No fevers, chills, nausea, vomiting, or diarrhea. 04/18/2021 upon evaluation today patient appears to be doing well with regard to his wounds in fact he appears to be completely healed which is great news. Fortunately there does not appear to be any signs of active infection which is great news. No fevers, chills, nausea, vomiting, or diarrhea. READMISSION 07/28/2021 This is a now 75 year old man  we have had in this clinic for quite a bit of this year. 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 He wears compression stockings. 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 He comes in an old running shoes although he says he wears better shoes at home. His history is that he noticed a blister in the right posterior heel. This open. He has been applying Neosporin to it currently the wound measures 2 x 1.4 cm. 100% slough covered. Not really offloading this in any rigorous way. Past medical history is essentially unchanged he has paroxysmal atrial fibrillation on Coumadin type 2 diabetes with a recent hemoglobin A1c of 7 on metformin. ABI in our clinic was 0.98 on the right Electronic Signature(s) Signed: 07/28/2021 5:14:01 PM By: Linton Ham MD Entered By: Linton Ham on 07/28/2021 11:06:36 [name], [name]  ([id number]) -------------------------------------------------------------------------------- Physical Exam Details Patient Name: [name], [name]. Date of Service: 07/28/2021 10:15 AM Medical Record Number: [id number] Patient Account Number: [id number] Date of Birth/Sex: [birth year] (76 y.o. M) Treating RN: Donnamarie Poag Primary Care Provider: Fulton Reek Other Clinician: Referring Provider: Fulton Reek Treating Provider/Extender: Tito Dine in Treatment: 0 Constitutional Patient is hypertensive.. Pulse regular and within target range for patient.Marland Kitchen Respirations regular, non-labored and within target range.. Temperature is normal and within the target range for the patient.Marland Kitchen appears in no distress. Cardiovascular Pedal pulses are palpable. Changes of chronic venous insufficiency however the edema seems to be well controlled. Notes Wound exam; near the plantar tip of the right heel. Small wound 100% slough covered with some dry flaking skin around the margins I remove this with a #5 curette there was minimal bleeding. No evidence of surrounding infection. Electronic Signature(s) Signed: 07/28/2021 5:14:01 PM By: Linton Ham MD Entered By: Linton Ham on 07/28/2021 11:10:13 [name] ([id number]) -------------------------------------------------------------------------------- Physician Orders Details Patient Name: [name], [name]. Date of Service: 07/28/2021 10:15 AM Medical Record Number: [id number] Patient Account Number: [id number] Date of Birth/Sex: May 08, 1946 (75 y.o. M) Treating RN: Donnamarie Poag Primary Care Provider: Fulton Reek Other Clinician: Referring Provider: Fulton Reek Treating Provider/Extender: Tito Dine in Treatment: 0 Verbal / Phone Orders: No Diagnosis Coding Follow-up Appointments o Return Appointment in 1 week. Bathing/ Shower/ Hygiene o May shower; gently cleanse wound with antibacterial soap, rinse  and pat dry prior to dressing wounds - keep dressing dry o May shower with wound dressing protected with water repellent cover or cast protector. o No tub bath. Edema Control - Lymphedema / Segmental Compressive Device / Other o Patient to wear own compression stockings. Remove compression stockings every night before going to bed and put on every morning when getting up. - right leg o Elevate leg(s) parallel to the floor when sitting. o DO YOUR BEST to sleep in the bed at night. DO NOT sleep in your recliner. Long hours of sitting in a recliner leads to swelling of the legs and/or potential wounds on your backside. Off-Loading o Other: - L heel offloader shoe Keep pressure off of heel Medications-Please add to medication list. o Other: - check blood sugar and eat protein for wound healing Wound Treatment Wound #9 - Calcaneus Wound Laterality: Left Cleanser: Byram Ancillary Kit - 15 Day Supply (DME) (Generic) Every Other Day/15 Days Discharge Instructions: Use supplies as instructed; Kit contains: (15) Saline Bullets; (15) 3x3 Gauze; 15 pr Gloves Cleanser: Normal Saline Every Other Day/15 Days Discharge Instructions:  Wash your hands with soap and water. Remove old dressing, discard into plastic bag and place into trash. Cleanse the wound with Normal Saline prior to applying a clean dressing using gauze sponges, not tissues or cotton balls. Do not scrub or use excessive force. Pat dry using gauze sponges, not tissue or cotton balls. Primary Dressing: Silvercel Small 2x2 (in/in) (DME) (Generic) Every Other Day/15 Days Discharge Instructions: Apply Silvercel Small 2x2 (in/in) as instructed Secondary Dressing: Gauze (DME) (Generic) Every Other Day/15 Days Secondary Dressing: heel cup Every Other Day/15 Days Discharge Instructions: apply over dressing to protect heel Secured With: 2M Medipore H Soft Cloth Surgical Tape, 2x2 (in/yd) (DME) (Generic) Every Other Day/15 Days Secured  With: Kerlix Roll Sterile or Non-Sterile 6-ply 4.5x4 (yd/yd) (DME) (Generic) Every Other Day/15 Days Discharge Instructions: Apply Kerlix as directed Electronic Signature(s) Signed: 07/28/2021 3:42:16 PM By: Donnamarie Poag Signed: 07/28/2021 5:14:01 PM By: Linton Ham MD Entered By: Donnamarie Poag on 07/28/2021 10:59:22 [name] ([id number]) -------------------------------------------------------------------------------- Problem List Details Patient Name: [name], [name]. Date of Service: 07/28/2021 10:15 AM Medical Record Number: [id number] Patient Account Number: [id number] Date of Birth/Sex: 1945/12/19 (75 y.o. 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 Date of Service: 07/28/2021 10:15 AM Medical Record Number: [id number] Patient Account Number: [id number] Date of Birth/Sex: 01-Feb-1946 (75 y.o. M) Treating RN: Donnamarie Poag Primary Care Provider: Fulton Reek Other Clinician: Referring Provider: Fulton Reek Treating Provider/Extender: Tito Dine in Treatment: 0 Subjective History of Present Illness (HPI) 09/24/2020 on evaluation today patient presents  today for a heel ulcer that he tells me has been present for about 2 years. He has been seeing podiatry and they have been attempting to manage this including what sounds to be a total contact cast, Unna boot, and just standard dressings otherwise as well. Most recently has been using triple antibiotic ointment. With that being said unfortunately despite everything he really has not had any significant improvement. He tells me that he cannot even really remember exactly how this began but he presumed it may have rubbed on his shoes or something of that nature. 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 He tells me that this does seem to help. Fortunately there is no signs of active infection at this time. His most recent hemoglobin A1c was 8.1 though he knows this was this year he cannot tell me the exact time. His fluid pills currently to help with some of the lower extremity edema although he does obviously have signs of venous stasis/lymphedema. Currently there is no evidence of active infection. No fevers, chills, nausea, vomiting, or diarrhea. Patient has had fairly  recent ABIs which were performed on 07/19/2020 and revealed that he has normal findings in both the ankle and toe locations bilaterally. His ABI on the right was 1.09 on the left was 1.08 with a TBI on the right of 0.88 and on the left of 0.94. Triphasic flow was noted throughout. 10/08/2020 on evaluation today patient appears to be doing pretty well in regard to his left heel currently in fact this is doing a great job and seems to be healing quite nicely. Unfortunately on his right leg he had a pile of wood that actually fell on him injuring his right leg  this is somewhat erythematous has me concerned little bit about cellulitis though there is not really a good area to culture at this point. 10/24/2020 upon evaluation today patient appears to be doing well with regard to his heel ulcer. He is showing signs of improvement which is great news. His right leg is completely healed. Overall I feel like he is doing excellent and there is no signs of infection. 11/07/2020 upon evaluation today patient appears to be doing well with regard to his heel ulcer. He tells me that last week when he was unable to come in his wife actually thought that the wound was very close to closing if not closed. Then it began to "reopen again". I really feel like what may have happened as the collagen may have dried over the wound bed and that because that misunderstanding with thinking that the wound was healing. With that being said I did not see it last week I do not know that for certain. Either way I feel like he is doing great today I see no signs of infection at this point. 11/26/2020 upon inspection today patient appears to be doing decently well in regard to his heel ulcer. He has been tolerating the dressing changes without complication. Fortunately there is no sign of active infection at this time. No fevers, chills, nausea, vomiting, or diarrhea. 12/10/2020 upon evaluation today patient appears to be doing fairly well in regard to the wound on his heel as well as what appears to be a new wound of the left first metatarsal head plantar aspect. This seems to be an area that was callus that has split as the patient tells me has been trying to walk on his toes more has probably where this came from. With that being said there does not appear to be signs of active infection which is great news. 12/17/2020 upon evaluation today patient actually appears to be making good progress currently. Fortunately there is no evidence of active infection at this time. Overall I feel like he  is very close to complete closure. 12/26/2020 upon evaluation today patient appears to be doing excellent in regard to his wounds. In fact I am not certain that these are not even completely healed on initial inspection. Overall I am very pleased with where things stand today. Good news is that they are healed he is actually get ready to go out of town and that will be helpful as well as he will be a full part of the time. Readmission: 02/04/2021 upon evaluation today patient appears to be doing well at this point in regard to his left heel that I previously saw him for. Unfortunately he is having issues with his right lower extremity. He has significant wounds at this point he also has erythema noted there is definite signs of cellulitis which is unfortunate. With that being said I think  we do need to address this sooner rather than later. The good news is he did have a nice trip to the beach. He tells me that he had no issues during that time. 4/27; patient on Bactrim. Culture showed methicillin sensitive staph aureus therefore the Bactrim should be effective. 2 small areas on the right leg are healed the area on the mid aspect of the tibia almost 100% covered in a very adherent necrotic debris. We have been using silver alginate 02/20/2021 upon evaluation today patient appears to be doing well with regard to his wound. He is showing signs of improvement which is great news overall very pleased with where things stand today. No fevers, chills, nausea, vomiting, or diarrhea. 02/27/2021 upon evaluation today patient appears to be doing well with regard to his leg ulcer. He is tolerating the dressing changes and overall appears to be doing quite excellent. I am extremely pleased with where things stand and overall I think patient is making great progress. There is no sign of active infection at this time which is also great news. 03/06/2021 upon evaluation today patient appears to be doing excellent in  regard to his wounds. In fact he appears to be completely healed today based on what I am seeing. This is excellent news and overall I am extremely pleased with where he stands. Overall the patient is happy to hear this as well this has been quite sometime coming. Readmission: 04/01/2021 patient unfortunately returns for readmission today. He tells me that he has been wearing his compression socks on the right leg daily [name], [name] ([id number]) and he does not really know what is going on and why his legs are doing what they are doing. We did therefore go ahead and probe deeper into exactly what has been going on with him today. Subsequently the patient tells me that when he gets up and what we would call "first thing in the morning" are really 2 different things". He does not tend to sleep well so he tells me that he will often wake up at 3:00 in the morning. He will then potentially going into the living room to get in his chair where he may read a book for a little while and then potentially fall asleep back in his chair. He then subsequently wake up around 5:00 or so and then get up and in his words "putter around". This often will end with him proceeding at some point around 8 AM or 9 AM to putting on his compression socks. With that being said this means that anywhere from the 3:00 in the morning till roughly around 9:00 in the morning he has no compression on yet he is sitting in his recliner, walking around and up and about, and this is at least 5 to 6 hours of noncompressed time. That may be our issue here but I did not realize until we discussed this further today. Fortunately there does not appear to be any signs of active infection at this time which is great news. With that being said he has multiple wounds of the bilateral lower extremities. 04/10/2021 upon evaluation today patient appears to be doing well with regard to his legs for the most part. He does look like he had some  injury where his wrap slid down nonetheless he did some "doctoring on them". I do feel like most of the areas honestly have cleared back up which is great news. There does not appear to be any signs of active  infection at this time which is also great news. No fevers, chills, nausea, vomiting, or diarrhea. 04/18/2021 upon evaluation today patient appears to be doing well with regard to his wounds in fact he appears to be completely healed which is great news. Fortunately there does not appear to be any signs of active infection which is great news. No fevers, chills, nausea, vomiting, or diarrhea. READMISSION 07/28/2021 This is a now 75 year old man we have had in this clinic for quite a bit of this year. 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 He wears compression stockings. 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 He comes in an old running shoes although he says he wears better shoes at home. His history is that he noticed a blister in the right posterior heel. This open. He has been applying Neosporin to it currently the wound measures 2 x 1.4 cm. 100% slough covered. Not really offloading this in any rigorous way. Past medical history is essentially unchanged he has paroxysmal atrial fibrillation on Coumadin type 2 diabetes with a recent hemoglobin A1c of 7 on metformin. ABI in our clinic was 0.98 on the right Patient History Information obtained from Patient. Allergies tetracycline (Reaction: hives) Social History Former smoker - ended on 10/20/1995, Marital Status - Married, Alcohol Use - Rarely, Drug Use - No History, Caffeine Use - Daily. Medical History Eyes Patient has history of Cataracts - removed Cardiovascular Patient has history of Arrhythmia, Coronary Artery Disease - valve replacement, Hypertension - medication Endocrine Patient has history of Type II  Diabetes Musculoskeletal Patient has history of Osteoarthritis - hands, right knee Neurologic Patient has history of Neuropathy - feet Patient is treated with Oral Agents. Blood sugar is not tested. Review of Systems (ROS) Constitutional Symptoms (General Health) Denies complaints or symptoms of Fatigue, Fever, Chills, Marked Weight Change. Ear/Nose/Mouth/Throat Denies complaints or symptoms of Difficult clearing ears, Sinusitis. Hematologic/Lymphatic Denies complaints or symptoms of Bleeding / Clotting Disorders, Human Immunodeficiency Virus. Respiratory Denies complaints or symptoms of Chronic or frequent coughs, Shortness of Breath. Cardiovascular Complains or has symptoms of LE edema, hx cva Gastrointestinal Denies complaints or symptoms of Frequent diarrhea, Nausea, Vomiting. Genitourinary Denies complaints or symptoms of Kidney failure/ Dialysis, Incontinence/dribbling. Immunological Denies complaints or symptoms of Hives, Itching. Integumentary (Skin) Denies complaints or symptoms of Wounds, Bleeding or bruising tendency, Breakdown, Swelling. Psychiatric [name], [name] ([id number]) Denies complaints or symptoms of Anxiety, Claustrophobia. Objective Constitutional Patient is hypertensive.. Pulse regular and within target range for patient.Marland Kitchen Respirations regular, non-labored and within target range.. Temperature is normal and within the target range for the patient.Marland Kitchen appears in no distress. Vitals Time Taken: 10:18 AM, Height: 69 in, Source: Stated, Weight: 267 lbs, Source: Measured, BMI: 39.4, Temperature: 98.1 F, Pulse: 84 bpm, Respiratory Rate: 16 breaths/min, Blood Pressure: 145/77 mmHg. Cardiovascular Pedal pulses are palpable. Changes of chronic venous insufficiency however the edema seems to be well controlled. General Notes: Wound exam; near the plantar tip of the right heel. Small wound 100% slough covered with some dry flaking skin around the margins I remove  this with a #5 curette there was minimal bleeding. No evidence of surrounding infection. Integumentary (Hair, Skin) Wound #9 status is Open. Original cause of wound was Gradually Appeared. The date acquired was: 07/07/2021. The wound is located on the Left Calcaneus. The wound measures 2cm length x 1.4cm width x 0.1cm depth; 2.199cm^2 area and 0.22cm^3 volume. There  is Fat Layer (Subcutaneous Tissue) exposed. There is no tunneling or undermining noted. There is a medium amount of serosanguineous drainage noted. The wound margin is thickened. There is small (1-33%) red, pink granulation within the wound bed. There is a large (67-100%) amount of necrotic tissue within the wound bed including Adherent Slough. 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 There was a Selective/Open Wound Skin/Dermis Debridement with a total area of 2.8 sq cm performed by Ricard Dillon, MD. With the following instrument(s): Curette to remove Non-Viable tissue/material. Material removed includes Eschar and Skin: Dermis and after achieving pain control using Lidocaine. A time out was conducted at 10:51, prior to the start of the procedure. A Minimum amount of bleeding was controlled with Pressure. The procedure was tolerated well. Post Debridement Measurements: 2cm length x 1.4cm width x 0.1cm depth; 0.22cm^3 volume. Character of Wound/Ulcer Post Debridement is improved. Severity of Tissue Post Debridement is: Fat layer exposed. Post procedure Diagnosis Wound #9: Same as Pre-Procedure Plan Follow-up Appointments: Return Appointment in 1 week. Bathing/ Shower/  Hygiene: May shower; gently cleanse wound with antibacterial soap, rinse and pat dry prior to dressing wounds - keep dressing dry JARIS, KOHLES ([id number]) May shower with wound dressing protected with water repellent cover or cast protector. No tub bath. Edema Control - Lymphedema / Segmental Compressive Device / Other: Patient to wear own compression stockings. Remove compression stockings every night before going to bed and put on every morning when getting up. - right leg Elevate leg(s) parallel to the floor when sitting. DO YOUR BEST to sleep in the bed at night. DO NOT sleep in your recliner. Long hours of sitting in a recliner leads to swelling of the legs and/or potential wounds on your backside. Off-Loading: Other: - L heel offloader shoe Keep pressure off of heel Medications-Please add to medication list.: Other: - check blood sugar and eat protein for wound healing WOUND #9: - Calcaneus Wound Laterality: Left Cleanser: Byram Ancillary Kit - 15 Day Supply (DME) (Generic) Every Other Day/15 Days Discharge Instructions: Use supplies as instructed; Kit contains: (15) Saline Bullets; (15) 3x3 Gauze; 15 pr Gloves Cleanser: Normal Saline Every Other Day/15 Days Discharge Instructions: Wash your hands with soap and water. Remove old dressing, discard into plastic bag and place into trash. Cleanse the wound with Normal Saline prior to applying a clean dressing using gauze sponges, not tissues or cotton balls. Do not scrub or use excessive force. Pat dry using gauze sponges, not tissue or cotton balls. Primary Dressing: Silvercel Small 2x2 (in/in) (DME) (Generic) Every Other Day/15 Days Discharge Instructions: Apply Silvercel Small 2x2 (in/in) as instructed Secondary Dressing: Gauze (DME) (Generic) Every Other Day/15 Days Secondary Dressing: heel cup Every Other Day/15 Days Discharge Instructions: apply over dressing to protect heel Secured With: 48M Medipore H Soft Cloth Surgical  Tape, 2x2 (in/yd) (DME) (Generic) Every Other Day/15 Days Secured With: Kerlix Roll Sterile or Non-Sterile 6-ply 4.5x4 (yd/yd) (DME) (Generic) Every Other Day/15 Days Discharge Instructions: Apply Kerlix as directed 1. Recurrent wound on the right heel. 2. We use silver alginate on the wound, heel cup, kerlix and Coban to hold this in place. We gave him a  heel offloading boot and encouraged him to wear this all the time. He is already offloading his heels at home with what sounds like a bunny boot. 3. The patient is frustrated about the recurrence rate of this I am not really certain what shoes he wears on an ongoing basis and how he pads these areas in his shoes we will have to look at all of this before we discharge him. 4. I saw no evidence of infection. I think this is diabetic neuropathy pathic wounds caused by friction with his foot wear Electronic Signature(s) Signed: 07/28/2021 5:14:01 PM By: Linton Ham MD Entered By: Linton Ham on 07/28/2021 11:12:02 SHAMUS, DESANTIS ([id number]) -------------------------------------------------------------------------------- ROS/PFSH Details Patient Name: RANA, ADORNO. Date of Service: 07/28/2021 10:15 AM Medical Record Number: [id number] Patient Account Number: [id number] Date of Birth/Sex: 1946-10-01 (75 y.o. M) Treating RN: Donnamarie Poag Primary Care Provider: Fulton Reek Other Clinician: Referring Provider: Fulton Reek Treating Provider/Extender: Tito Dine in Treatment: 0 Information Obtained From Patient Constitutional Symptoms (General Health) Complaints and Symptoms: Negative for: Fatigue; Fever; Chills; Marked Weight Change Ear/Nose/Mouth/Throat Complaints and Symptoms: Negative for: Difficult clearing ears; Sinusitis Hematologic/Lymphatic Complaints and Symptoms: Negative for: Bleeding / Clotting Disorders; Human Immunodeficiency Virus Respiratory Complaints and Symptoms: Negative for: Chronic  or frequent coughs; Shortness of Breath Cardiovascular Complaints and Symptoms: Positive for: LE edema Review of System Notes: hx cva Medical History: Positive for: Arrhythmia; Coronary Artery Disease - valve replacement; Hypertension - medication Gastrointestinal Complaints and Symptoms: Negative for: Frequent diarrhea; Nausea; Vomiting Genitourinary Complaints and Symptoms: Negative for: Kidney failure/ Dialysis; Incontinence/dribbling Immunological Complaints and Symptoms: Negative for: Hives; Itching Integumentary (Skin) Complaints and Symptoms: Negative for: Wounds; Bleeding or bruising tendency; Breakdown; Swelling Psychiatric Complaints and Symptoms: Negative for: Anxiety; Claustrophobia FARHAN, JEAN ([id number]) Eyes Medical History: Positive for: Cataracts - removed Endocrine Medical History: Positive for: Type II Diabetes Time with diabetes: 10 years Treated with: Oral agents Blood sugar tested every day: No Musculoskeletal Medical History: Positive for: Osteoarthritis - hands, right knee Neurologic Medical History: Positive for: Neuropathy - feet Oncologic HBO Extended History Items Eyes: Cataracts Immunizations Pneumococcal Vaccine: Received Pneumococcal Vaccination: No Implantable Devices None Family and Social History Former smoker - ended on 10/20/1995; Marital Status - Married; Alcohol Use: Rarely; Drug Use: No History; Caffeine Use: Daily; Financial Concerns: No; Food, Clothing or Shelter Needs: No; Support System Lacking: No; Transportation Concerns: No Electronic Signature(s) Signed: 07/28/2021 3:42:16 PM By: Donnamarie Poag Signed: 07/28/2021 5:14:01 PM By: Linton Ham MD Entered By: Donnamarie Poag on 07/28/2021 10:26:40 [name] ([id number]) -------------------------------------------------------------------------------- SuperBill Details Patient Name: SEBRON, MCMAHILL. Date of Service: 07/28/2021 Medical Record Number:  [id number] Patient Account Number: [id number] Date of Birth/Sex: 1946/05/22 (75 y.o. M) Treating RN: Donnamarie Poag Primary Care Provider: Fulton Reek Other Clinician: Referring Provider: Fulton Reek Treating Provider/Extender: Tito Dine in Treatment: 0 Diagnosis Coding ICD-10 Codes Code Description E11.621 Type 2 diabetes mellitus with foot ulcer L97.421 Non-pressure chronic ulcer of left heel and midfoot limited to breakdown of skin E11.42 Type 2 diabetes mellitus with diabetic polyneuropathy Facility Procedures CPT4 Code: 81829937 Description: 99213 - WOUND CARE VISIT-LEV 3 EST PT Modifier: Quantity: 1 CPT4 Code: 16967893 Description: 81017 - DEBRIDE WOUND 1ST 20 SQ CM OR < Modifier: Quantity: 1 CPT4 Code: Description: ICD-10 Diagnosis Description L97.421 Non-pressure chronic ulcer of left heel and midfoot limited to breakdown Modifier: of skin Quantity: Physician Procedures CPT4 Code: 5102585 Description: 99213 - WC PHYS LEVEL 3 - EST PT  Modifier: 25 Quantity: 1 CPT4 Code: Description: ICD-10 Diagnosis Description E11.621 Type 2 diabetes mellitus with foot ulcer L97.421 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

## 2021-07-28 NOTE — Progress Notes (Signed)
FURMAN, TRENTMAN ([id number]) Visit Report for 07/28/2021 Abuse/Suicide Risk Screen Details Patient Name: [name], [name]. Date of Service: 07/28/2021 10:15 AM Medical Record Number: [id number] Patient Account Number: [id number] Date of Birth/Sex: [birth year] (75 y.o. M) Treating RN: Donnamarie Poag Primary Care Nikalas Bramel: Fulton Reek Other Clinician: Referring Magdalynn Davilla: Fulton Reek Treating Sindia Kowalczyk/Extender: Tito Dine in Treatment: 0 Abuse/Suicide Risk Screen Items Answer ABUSE RISK SCREEN: Has anyone close to you tried to hurt or harm you recentlyo No Do you feel uncomfortable with anyone in your familyo No Has anyone forced you do things that you didnot want to doo No Electronic Signature(s) Signed: 07/28/2021 3:42:16 PM By: Donnamarie Poag Entered By: Donnamarie Poag on 07/28/2021 10:26:47 [name] ([id number]) -------------------------------------------------------------------------------- Activities of Daily Living Details Patient Name: [name], [name]. Date of Service: 07/28/2021 10:15 AM Medical Record Number: [id number] Patient Account Number: [id number] Date of Birth/Sex: [birth year] (75 y.o. M) Treating RN: Donnamarie Poag Primary Care Alliana Mcauliff: Fulton Reek Other Clinician: Referring Ruchama Kubicek: Fulton Reek Treating Dakwon Wenberg/Extender: Tito Dine in Treatment: 0 Activities of Daily Living Items Answer Activities of Daily Living (Please select one for each item) Drive Automobile Completely Able Take Medications Completely Able Use Telephone Completely Able Care for Appearance Completely Able Use Toilet Completely Able Bath / Shower Completely Able Dress Self Completely Able Feed Self Completely Able Walk Completely Able Get In / Out Bed Completely Able Housework Completely Able Prepare Meals Completely [place] Completely Able Shop for Self Completely Able Electronic Signature(s) Signed: 07/28/2021 3:42:16 PM By:  Donnamarie Poag Entered By: Donnamarie Poag on 07/28/2021 10:27:09 [name] ([id number]) -------------------------------------------------------------------------------- Education Screening Details Patient Name: [name], [name]. Date of Service: 07/28/2021 10:15 AM Medical Record Number: [id number] Patient Account Number: [id number] Date of Birth/Sex: 04-12-1946 (75 y.o. M) Treating RN: Donnamarie Poag Primary Care Brandy Zuba: Fulton Reek Other Clinician: Referring Derreon Consalvo: Fulton Reek Treating Meghan Tiemann/Extender: Tito Dine in Treatment: 0 Primary Learner Assessed: Patient Learning Preferences/Education Level/Primary Language Learning Preference: Explanation Highest Education Level: College or Above Preferred Language: English Cognitive Barrier Language Barrier: No Translator Needed: No Memory Deficit: No Emotional Barrier: No Cultural/Religious Beliefs Affecting Medical Care: No Physical Barrier Impaired Vision: No Impaired Hearing: No Decreased Hand dexterity: No Knowledge/Comprehension Knowledge Level: High Comprehension Level: High Ability to understand written instructions: High Ability to understand verbal instructions: High Motivation Anxiety Level: Calm Cooperation: Cooperative Education Importance: Acknowledges Need Interest in Health Problems: Asks Questions Perception: Coherent Willingness to Engage in Self-Management High Activities: Readiness to Engage in Self-Management High Activities: Electronic Signature(s) Signed: 07/28/2021 3:42:16 PM By: Donnamarie Poag Entered By: Donnamarie Poag on 07/28/2021 10:27:34 [name] ([id number]) -------------------------------------------------------------------------------- Fall Risk Assessment Details Patient Name: [name]. Date of Service: 07/28/2021 10:15 AM Medical Record Number: [id number] Patient Account Number: [id number] Date of Birth/Sex: 1945-10-22 (75 y.o. M) Treating RN:  Donnamarie Poag Primary Care Ann-Marie Kluge: Fulton Reek Other Clinician: Referring Johnattan Strassman: Fulton Reek Treating Aerabella Galasso/Extender: Tito Dine in Treatment: 0 Fall Risk Assessment Items Have you had 2 or more falls in the last 12 monthso 0 No Have you had any fall that resulted in injury in the last 12 monthso 0 Yes FALLS RISK SCREEN History of falling - immediate or within 3 months 0 No Secondary diagnosis (Do you have 2 or more medical diagnoseso) 15 Yes Ambulatory aid None/bed rest/wheelchair/nurse 0 Yes Crutches/cane/walker 0 No Furniture 0 No Intravenous therapy Access/Saline/Heparin Lock 0 No Gait/Transferring Normal/ bed rest/ wheelchair 0 Yes Weak (short steps  with or without shuffle, stooped but able to lift head while walking, may 0 No seek support from furniture) Impaired (short steps with shuffle, may have difficulty arising from chair, head down, impaired 0 No balance) Mental Status Oriented to own ability 0 Yes Electronic Signature(s) Signed: 07/28/2021 3:42:16 PM By: Donnamarie Poag Entered By: Donnamarie Poag on 07/28/2021 10:28:05 [name] ([id number]) -------------------------------------------------------------------------------- Foot Assessment Details Patient Name: [name], [name]. Date of Service: 07/28/2021 10:15 AM Medical Record Number: [id number] Patient Account Number: [id number] Date of Birth/Sex: Mar 19, 1946 (76 y.o. M) Treating RN: Donnamarie Poag Primary Care Eugina Row: Fulton Reek Other Clinician: Referring Valaria Kohut: Fulton Reek Treating Jenah Vanasten/Extender: Tito Dine in Treatment: 0 Foot Assessment Items Site Locations + = Sensation present, - = Sensation absent, C = Callus, U = Ulcer R = Redness, W = Warmth, M = Maceration, PU = Pre-ulcerative lesion F = Fissure, S = Swelling, D = Dryness Assessment Right: Left: Other Deformity: No No Prior Foot Ulcer: No No Prior Amputation: No No Charcot Joint: No  No Ambulatory Status: Ambulatory Without Help Gait: Steady Electronic Signature(s) Signed: 07/28/2021 3:42:16 PM By: Donnamarie Poag Entered By: Donnamarie Poag on 07/28/2021 10:31:33 [name] ([id number]) -------------------------------------------------------------------------------- Nutrition Risk Screening Details Patient Name: [name], [name]. Date of Service: 07/28/2021 10:15 AM Medical Record Number: [id number] Patient Account Number: [id number] Date of Birth/Sex: [birth year] (75 y.o. M) Treating RN: Donnamarie Poag Primary Care Myriam Brandhorst: Fulton Reek Other Clinician: Referring Tauna Macfarlane: Fulton Reek Treating Jakorey Mcconathy/Extender: Tito Dine in Treatment: 0 Height (in): 69 Weight (lbs): 267 Body Mass Index (BMI): 39.4 Nutrition Risk Screening Items Score Screening NUTRITION RISK SCREEN: I have an illness or condition that made me change the kind and/or amount of food I eat 0 No I eat fewer than two meals per day 0 No I eat few fruits and vegetables, or milk products 0 No I have three or more drinks of beer, liquor or wine almost every day 0 No I have tooth or mouth problems that make it hard for me to eat 0 No I don't always have enough money to buy the food I need 0 No I eat alone most of the time 0 No I take three or more different prescribed or over-the-counter drugs a day 1 Yes Without wanting to, I have lost or gained 10 pounds in the last six months 0 No I am not always physically able to shop, cook and/or feed myself 0 No Nutrition Protocols Good Risk Protocol 0 No interventions needed Moderate Risk Protocol High Risk Proctocol Risk Level: Good Risk Score: 1 Electronic Signature(s) Signed: 07/28/2021 3:42:16 PM By: Donnamarie Poag Entered ByDonnamarie Poag on 07/28/2021 10:28:16

## 2021-07-28 NOTE — Progress Notes (Signed)
LARK, RUNK ([id number]) Visit Report for 07/28/2021 Allergy List Details Patient Name: DICK, HARK. Date of Service: 07/28/2021 10:15 AM Medical Record Number: [id number] Patient Account Number: [id number] Date of Birth/Sex: 06-03-1946 (75 y.o. M) Treating RN: Donnamarie Poag Primary Care Hatsue Sime: Fulton Reek Other Clinician: Referring Faviola Klare: Fulton Reek Treating Anastasiya Gowin/Extender: Ricard Dillon Weeks in Treatment: 0 Allergies Active Allergies tetracycline Reaction: hives Allergy Notes Electronic Signature(s) Signed: 07/28/2021 3:42:16 PM By: Donnamarie Poag Entered By: Donnamarie Poag on 07/28/2021 10:23:29 [name] ([id number]) -------------------------------------------------------------------------------- Arrival Information Details Patient Name: SLADEN, PLANCARTE. Date of Service: 07/28/2021 10:15 AM Medical Record Number: [id number] Patient Account Number: [id number] Date of Birth/Sex: Jan 18, 1946 (75 y.o. M) Treating RN: Donnamarie Poag Primary Care Tija Biss: Fulton Reek Other Clinician: Referring Kamille Toomey: Fulton Reek Treating Denitra Donaghey/Extender: Tito Dine in Treatment: 0 Visit Information Patient Arrived: [name] Arrival Time: 10:15 Accompanied By: self Transfer Assistance: None Patient Identification Verified: Yes Secondary Verification Process Completed: Yes Patient Has Alerts: Yes Patient Alerts: Patient on Blood Thinner Coumadin Diabetic aspirin 100m History Since Last Visit Electronic Signature(s) Signed: 07/28/2021 3:42:16 PM By: BDonnamarie PoagEntered By: BDonnamarie Poagon 07/28/2021 10:20:07 FEloise Levels([id number] -------------------------------------------------------------------------------- Clinic Level of Care Assessment Details Patient Name: FEloise Levels Date of Service: 07/28/2021 10:15 AM Medical Record Number: [id number] Account Number: [id number] of Birth/Sex: [birth year](75y.o.  M) Treating RN: BDonnamarie PoagPrimary Care Marquetta Weiskopf: SFulton ReekOther Clinician: Referring Veronica Guerrant: SFulton ReekTreating Jenene Kauffmann/Extender: RTito Dinein Treatment: 0 Clinic Level of Care Assessment Items TOOL 1 Quantity Score '[]'  - Use when EandM and Procedure is performed on INITIAL visit 0 ASSESSMENTS - Nursing Assessment / Reassessment X - General Physical Exam (combine w/ comprehensive assessment (listed just below) when performed on new 1 20 pt. evals) X- 1 25 Comprehensive Assessment (HX, ROS, Risk Assessments, Wounds Hx, etc.) ASSESSMENTS - Wound and Skin Assessment / Reassessment '[]'  - Dermatologic / Skin Assessment (not related to wound area) 0 ASSESSMENTS - Ostomy and/or Continence Assessment and Care '[]'  - Incontinence Assessment and Management 0 '[]'  - 0 Ostomy Care Assessment and Management (repouching, etc.) PROCESS - Coordination of Care X - Simple Patient / Family Education for ongoing care 1 15 '[]'  - 0 Complex (extensive) Patient / Family Education for ongoing care X- 1 10 Staff obtains CProgrammer, systems Records, Test Results / Process Orders '[]'  - 0 Staff telephones HHA, Nursing Homes / Clarify orders / etc '[]'  - 0 Routine Transfer to another Facility (non-emergent condition) '[]'  - 0 Routine Hospital Admission (non-emergent condition) X- 1 15 New Admissions / IBiomedical engineer/ Ordering NPWT, Apligraf, etc. '[]'  - 0 Emergency Hospital Admission (emergent condition) PROCESS - Special Needs '[]'  - Pediatric / Minor Patient Management 0 '[]'  - 0 Isolation Patient Management '[]'  - 0 Hearing / Language / Visual special needs '[]'  - 0 Assessment of Community assistance (transportation, D/C planning, etc.) '[]'  - 0 Additional assistance / Altered mentation '[]'  - 0 Support Surface(s) Assessment (bed, cushion, seat, etc.) INTERVENTIONS - Miscellaneous '[]'  - External ear exam 0 '[]'  - 0 Patient Transfer (multiple staff / HCivil Service fast streamer/ Similar devices) '[]'  -  0 Simple Staple / Suture removal (25 or less) '[]'  - 0 Complex Staple / Suture removal (26 or more) '[]'  - 0 Hypo/Hyperglycemic Management (do not check if billed separately) X- 1 15 Ankle / Brachial Index (ABI) - do not check if billed separately Has the patient been seen at the hospital within the last  three years: Yes Total Score: 100 Level Of Care: New/Established - Level 3 COREYON, NICOTRA ([id number]) Electronic Signature(s) Signed: 07/28/2021 3:42:16 PM By: Donnamarie Poag Entered By: Donnamarie Poag on 07/28/2021 11:00:12 [name] ([id number]) -------------------------------------------------------------------------------- Encounter Discharge Information Details Patient Name: ESAI, STECKLEIN. Date of Service: 07/28/2021 10:15 AM Medical Record Number: [id number] Patient Account Number: [id number] Date of Birth/Sex: 1946-09-07 (75 y.o. M) Treating RN: Donnamarie Poag Primary Care Surya Schroeter: Fulton Reek Other Clinician: Referring Liliann File: Fulton Reek Treating Loc Feinstein/Extender: Tito Dine in Treatment: 0 Encounter Discharge Information Items Post Procedure Vitals Discharge Condition: Stable Temperature (F): 98.1 Ambulatory Status: Cane Pulse (bpm): 84 Discharge Destination: Home Respiratory Rate (breaths/min): 16 Transportation: Private Auto Blood Pressure (mmHg): 145/77 Accompanied By: self Schedule Follow-up Appointment: Yes Clinical Summary of Care: Electronic Signature(s) Signed: 07/28/2021 3:42:16 PM By: Donnamarie Poag Entered By: Donnamarie Poag on 07/28/2021 11:13:41 [name] ([id number]) -------------------------------------------------------------------------------- Lower Extremity Assessment Details Patient Name: CADEN, FUKUSHIMA. Date of Service: 07/28/2021 10:15 AM Medical Record Number: [id number] Patient Account Number: [id number] Date of Birth/Sex: 19-May-1946 (75 y.o. M) Treating RN: Donnamarie Poag Primary Care Curtina Grills: Fulton Reek Other Clinician: Referring Luree Palla: Fulton Reek Treating Leisel Pinette/Extender: Tito Dine in Treatment: 0 Edema Assessment Assessed: [Left: Yes] [Right: No] Edema: [Left: N] [Right: o] Calf Left: Right: Point of Measurement: 34 cm From Medial Instep 40.5 cm Ankle Left: Right: Point of Measurement: 13 cm From Medial Instep 23.5 cm Knee To Floor Left: Right: From Medial Instep 43 cm Vascular Assessment Pulses: Dorsalis Pedis Palpable: [Left:No] Doppler Audible: [Left:Yes] Blood Pressure: Brachial: [Left:118] Ankle: [Left:Dorsalis Pedis: 116 0.98] Electronic Signature(s) Signed: 07/28/2021 3:42:16 PM By: Donnamarie Poag Entered By: Donnamarie Poag on 07/28/2021 10:42:01 [name] ([id number]) -------------------------------------------------------------------------------- Multi Wound Chart Details Patient Name: [name]. Date of Service: 07/28/2021 10:15 AM Medical Record Number: [id number] Patient Account Number: [id number] Date of Birth/Sex: 08/16/1946 (75 y.o. M) Treating RN: Donnamarie Poag Primary Care March Joos: Fulton Reek Other Clinician: Referring Shahil Speegle: Fulton Reek Treating Cianna Kasparian/Extender: Tito Dine in Treatment: 0 Vital Signs Height(in): 17 Pulse(bpm): 63 Weight(lbs): 22 Blood Pressure(mmHg): 145/77 Body Mass Index(BMI): 39 Temperature(F): 98.1 Respiratory Rate(breaths/min): 16 Photos: [N/A:N/A] Wound Location: Left Calcaneus N/A N/A Wounding Event: Gradually Appeared N/A N/A Primary Etiology: Diabetic Wound/Ulcer of the Lower N/A N/A Extremity Comorbid History: Cataracts, Arrhythmia, Coronary N/A N/A Artery Disease, Hypertension, Type II Diabetes, Osteoarthritis, Neuropathy Date Acquired: 07/07/2021 N/A N/A Weeks of Treatment: 0 N/A N/A Wound Status: Open N/A N/A Measurements L x W x D (cm) 2x1.4x0.1 N/A N/A Area (cm) : 2.199 N/A N/A Volume (cm) : 0.22 N/A N/A % Reduction in Area: 0.00%  N/A N/A % Reduction in Volume: 0.00% N/A N/A Classification: Grade 2 N/A N/A Exudate Amount: Medium N/A N/A Exudate Type: Serosanguineous N/A N/A Exudate Color: red, brown N/A N/A Wound Margin: Thickened N/A N/A Granulation Amount: Small (1-33%) N/A N/A Granulation Quality: Red, Pink N/A N/A Necrotic Amount: Large (67-100%) N/A N/A Exposed Structures: Fat Layer (Subcutaneous Tissue): N/A N/A Yes Fascia: No Tendon: No Muscle: No Joint: No Bone: No Debridement: Debridement - Selective/Open N/A N/A Wound Pre-procedure Verification/Time 10:51 N/A N/A Out Taken: Pain Control: Lidocaine N/A N/A Tissue Debrided: Necrotic/Eschar N/A N/A Level: Non-Viable Tissue N/A N/A Debridement Area (sq cm): 2.8 N/A N/A Instrument: Curette N/A N/A Bleeding: Minimum N/A N/A Hemostasis Achieved: Pressure N/A N/A Procedure was tolerated well N/A N/A QUINTARIUS, FERNS ([id number]) Debridement Treatment Response: Post Debridement 2x1.4x0.1 N/A N/A Measurements L x W  x D (cm) Post Debridement Volume: 0.22 N/A N/A (cm) Procedures Performed: Debridement N/A N/A Treatment Notes Electronic Signature(s) Signed: 07/28/2021 5:14:01 PM By: Linton Ham MD Entered By: Linton Ham on 07/28/2021 11:02:56 [name] ([id number]) -------------------------------------------------------------------------------- [place] Details Patient Name: LAVERT, MATOUSEK. Date of Service: 07/28/2021 10:15 AM Medical Record Number: [id number] Patient Account Number: [id number] Date of Birth/Sex: 08/08/1946 (75 y.o. M) Treating RN: Donnamarie Poag Primary Care Abbie Jablon: Fulton Reek Other Clinician: Referring Dakotah Heiman: Fulton Reek Treating Tiahna Cure/Extender: Tito Dine in Treatment: 0 Active Inactive Wound/Skin Impairment Nursing Diagnoses: Impaired tissue integrity Knowledge deficit related to smoking impact on wound healing Knowledge deficit related to  ulceration/compromised skin integrity Goals: Patient/caregiver will verbalize understanding of skin care regimen Date Initiated: 07/28/2021 Target Resolution Date: 08/08/2021 Goal Status: Active Ulcer/skin breakdown will have a volume reduction of 30% by week 4 Date Initiated: 07/28/2021 Target Resolution Date: 08/28/2021 Goal Status: Active Ulcer/skin breakdown will have a volume reduction of 50% by week 8 Date Initiated: 07/28/2021 Target Resolution Date: 09/27/2021 Goal Status: Active Ulcer/skin breakdown will have a volume reduction of 80% by week 12 Date Initiated: 07/28/2021 Target Resolution Date: 10/28/2020 Goal Status: Active Ulcer/skin breakdown will heal within 14 weeks Date Initiated: 07/28/2021 Target Resolution Date: 11/12/2020 Goal Status: Active Interventions: Assess patient/caregiver ability to obtain necessary supplies Assess patient/caregiver ability to perform ulcer/skin care regimen upon admission and as needed Assess ulceration(s) every visit Notes: Electronic Signature(s) Signed: 07/28/2021 3:42:16 PM By: Donnamarie Poag Entered By: Donnamarie Poag on 07/28/2021 10:49:16 [name] ([id number]) -------------------------------------------------------------------------------- Pain Assessment Details Patient Name: [name]. Date of Service: 07/28/2021 10:15 AM Medical Record Number: [id number] Patient Account Number: [id number] Date of Birth/Sex: [birth year] (75 y.o. M) Treating RN: Donnamarie Poag Primary Care Van Ehlert: Fulton Reek Other Clinician: Referring Asalee Barrette: Fulton Reek Treating Timouthy Gilardi/Extender: Tito Dine in Treatment: 0 Active Problems Location of Pain Severity and Description of Pain Patient Has Paino No Site Locations Rate the pain. Current Pain Level: 0 Pain Management and Medication Current Pain Management: Electronic Signature(s) Signed: 07/28/2021 3:42:16 PM By: Donnamarie Poag Entered By: Donnamarie Poag on  07/28/2021 10:21:05 [name] ([id number]) -------------------------------------------------------------------------------- Patient/Caregiver Education Details Patient Name: [name] Date of Service: 07/28/2021 10:15 AM Medical Record Number: [id number] Patient Account Number: [id number] Date of Birth/Gender: 10-12-1946 (75 y.o. M) Treating RN: Donnamarie Poag Primary Care Physician: Fulton Reek Other Clinician: Referring Physician: Fulton Reek Treating Physician/Extender: Tito Dine in Treatment: 0 Education Assessment Education Provided To: Patient Education Topics Provided Basic Hygiene: Nutrition: Offloading: Wound Debridement: Wound/Skin Impairment: Electronic Signature(s) Signed: 07/28/2021 3:42:16 PM By: Donnamarie Poag Entered By: Donnamarie Poag on 07/28/2021 11:04:48 [name] ([id number]) -------------------------------------------------------------------------------- Wound Assessment Details Patient Name: SCOTT, VANDERVEER. Date of Service: 07/28/2021 10:15 AM Medical Record Number: [id number] Patient Account Number: [id number] Date of Birth/Sex: 1946-02-10 (75 y.o. M) Treating RN: Donnamarie Poag Primary Care Kriste Broman: Fulton Reek Other Clinician: Referring Robbyn Hodkinson: Fulton Reek Treating Hartlyn Reigel/Extender: Tito Dine in Treatment: 0 Wound Status Wound Number: 9 Primary Diabetic Wound/Ulcer of the Lower Extremity Etiology: Wound Location: Left Calcaneus Wound Open Wounding Event: Gradually Appeared Status: Date Acquired: 07/07/2021 Comorbid Cataracts, Arrhythmia, Coronary Artery Disease, Weeks Of Treatment: 0 History: Hypertension, Type II Diabetes, Osteoarthritis, Clustered Wound: No Neuropathy Photos Wound Measurements Length: (cm) 2 Width: (cm) 1.4 Depth: (cm) 0.1 Area: (cm) 2.199 Volume: (cm) 0.22 % Reduction in Area: 0% % Reduction in Volume: 0% Tunneling: No Undermining: No Wound  Description Classification:  Grade 2 Wound Margin: Thickened Exudate Amount: Medium Exudate Type: Serosanguineous Exudate Color: red, brown Foul Odor After Cleansing: No Slough/Fibrino Yes Wound Bed Granulation Amount: Small (1-33%) Exposed Structure Granulation Quality: Red, Pink Fascia Exposed: No Necrotic Amount: Large (67-100%) Fat Layer (Subcutaneous Tissue) Exposed: Yes Necrotic Quality: Adherent Slough Tendon Exposed: No Muscle Exposed: No Joint Exposed: No Bone Exposed: No Treatment Notes Wound #9 (Calcaneus) Wound Laterality: Left Cleanser [place] Ancillary Kit - 15 Day Supply Discharge Instruction: Use supplies as instructed; Kit contains: (15) Saline Bullets; (15) 3x3 Gauze; 15 pr Gloves Normal Saline TREVELLE, MCGURN ([id number]) Discharge Instruction: Wash your hands with soap and water. Remove old dressing, discard into plastic bag and place into trash. Cleanse the wound with Normal Saline prior to applying a clean dressing using gauze sponges, not tissues or cotton balls. Do not scrub or use excessive force. Pat dry using gauze sponges, not tissue or cotton balls. Peri-Wound Care Topical Primary Dressing Silvercel Small 2x2 (in/in) Discharge Instruction: Apply Silvercel Small 2x2 (in/in) as instructed Secondary Dressing Gauze heel cup Discharge Instruction: apply over dressing to protect heel Secured With 16M [place] Surgical Tape, 2x2 (in/yd) Kerlix Roll Sterile or Non-Sterile 6-ply 4.5x4 (yd/yd) Discharge Instruction: Apply Kerlix as directed Compression Wrap Compression Stockings Add-Ons Electronic Signature(s) Signed: 07/28/2021 3:42:16 PM By: Donnamarie Poag Entered By: Donnamarie Poag on 07/28/2021 10:51:34 [name] ([id number]) -------------------------------------------------------------------------------- [place] Details Patient Name: [name]. Date of Service: 07/28/2021 10:15 AM Medical Record Number:  [id number] Patient Account Number: [id number] Date of Birth/Sex: 07/19/1946 (75 y.o. M) Treating RN: Donnamarie Poag Primary Care Chau Savell: Fulton Reek Other Clinician: Referring Cleta Heatley: Fulton Reek Treating Layce Sprung/Extender: Tito Dine in Treatment: 0 Vital Signs Time Taken: 10:18 Temperature (F): 98.1 Height (in): 69 Pulse (bpm): 84 Source: Stated Respiratory Rate (breaths/min): 16 Weight (lbs): 267 Blood Pressure (mmHg): 145/77 Source: Measured Reference Range: 80 - 120 mg / dl Body Mass Index (BMI): 39.4 Electronic Signature(s) Signed: 07/28/2021 3:42:16 PM By: Donnamarie Poag Entered ByDonnamarie Poag on 07/28/2021 10:23:20

## 2021-08-05 ENCOUNTER — Other Ambulatory Visit: Payer: Self-pay

## 2021-08-05 ENCOUNTER — Encounter: Payer: Medicare HMO | Admitting: Physician Assistant

## 2021-08-05 ENCOUNTER — Encounter: Payer: Self-pay | Admitting: Internal Medicine

## 2021-08-05 DIAGNOSIS — L97421 Non-pressure chronic ulcer of left heel and midfoot limited to breakdown of skin: Secondary | ICD-10-CM | POA: Diagnosis not present

## 2021-08-05 NOTE — Progress Notes (Signed)
[name], [name] ([id number]) Visit Report for 08/05/2021 Arrival Information Details Patient Name: [name], [name]. Date of Service: 08/05/2021 1:45 PM Medical Record Number: [id number] Patient Account Number: [id number] Date of Birth/Sex: 1946/03/07 (75 y.o. M) Treating RN: [name] Primary Care Labrisha Wuellner: Fulton Reek Other Clinician: Referring Iliany Losier: Fulton Reek Treating Anahy Esh/Extender: Skipper Cliche in Treatment: 1 Visit Information History Since Last Visit Added or deleted any medications: No Patient Arrived: Cane Has Footwear/Offloading in Place as Prescribed: Yes Arrival Time: 13:51 Left: Surgical Shoe with Pressure Relief Accompanied By: self Insole Transfer Assistance: None Pain Present Now: No Patient Identification Verified: Yes Secondary Verification Process Completed: Yes Patient Has Alerts: Yes Patient Alerts: Patient on Blood Thinner Coumadin Diabetic aspirin 81mg  Electronic Signature(s) Signed: 08/05/2021 5:32:40 PM By: [name], BSN, RN, CWS, [name] RN, BSN Entered By: [name], BSN, RN, CWS, [name] on 08/05/2021 13:51:38 [name] ([id number]) -------------------------------------------------------------------------------- Encounter Discharge Information Details Patient Name: [name], [name]. Date of Service: 08/05/2021 1:45 PM Medical Record Number: [id number] Patient Account Number: [id number] Date of Birth/Sex: 05/14/1946 (76 y.o. M) Treating RN: [name] Primary Care Ketzaly Cardella: Fulton Reek Other Clinician: Referring Rejeana Fadness: Fulton Reek Treating Miqueas Whilden/Extender: Skipper Cliche in Treatment: 1 Encounter Discharge Information Items Post Procedure Vitals Discharge Condition: Stable Temperature (F): 97.5 Ambulatory Status: Ambulatory Pulse (bpm): 65 Discharge Destination: Home Respiratory Rate (breaths/min): 16 Transportation: Private Auto Blood Pressure (mmHg): 161/79 Accompanied By: self Schedule Follow-up  Appointment: Yes Clinical Summary of Care: Electronic Signature(s) Signed: 08/05/2021 5:32:40 PM By: [name], BSN, RN, CWS, [name] RN, BSN Entered By: [name], BSN, RN, CWS, [name] on 08/05/2021 14:14:56 [name] ([id number]) -------------------------------------------------------------------------------- Lower Extremity Assessment Details Patient Name: [name], [name]. Date of Service: 08/05/2021 1:45 PM Medical Record Number: [id number] Patient Account Number: [id number] Date of Birth/Sex: Mar 11, 1946 (75 y.o. M) Treating RN: [name] Primary Care Maansi Wike: Fulton Reek Other Clinician: Referring Yoseline Andersson: Fulton Reek Treating [name]/Extender: Skipper Cliche in Treatment: 1 Edema Assessment Assessed: Shirlyn Goltz: Yes] [Right: No] Edema: [Left: N] [Right: o] Vascular Assessment Pulses: Dorsalis Pedis Palpable: [Left:Yes] Electronic Signature(s) Signed: 08/05/2021 5:32:40 PM By: [name], BSN, RN, CWS, [name] RN, BSN Entered By: [name], BSN, RN, CWS, [name] on 08/05/2021 14:07:42 [name] ([id number]) -------------------------------------------------------------------------------- Multi Wound Chart Details Patient Name: [name], [name]. Date of Service: 08/05/2021 1:45 PM Medical Record Number: [id number] Patient Account Number: [id number] Date of Birth/Sex: 1945/11/03 (75 y.o. 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] [N/A:N/A] Comorbid History: [9:Cataracts,  Arrhythmia, Coronary Artery Disease, Hypertension, Type II Diabetes, Osteoarthritis, Neuropathy] [N/A:N/A] Date Acquired: [9:07/07/2021] [N/A:N/A] Weeks of Treatment: [9:1] [N/A:N/A] Wound Status: [9:Open] [N/A:N/A] Measurements L x W x D (cm) [9:1.8x1.2x0.1] [N/A:N/A] Area (cm) : [9:1.696] [N/A:N/A] Volume (cm) : [9:0.17] [N/A:N/A] % Reduction in Area: [9:22.90%] [N/A:N/A] % Reduction in Volume: [9:22.70%] [N/A:N/A] Classification: [9:Grade 2] [N/A:N/A] Exudate Amount: [9:Medium] [N/A:N/A] Exudate Type: [9:Serosanguineous] [N/A:N/A] Exudate Color: [9:red, brown] [N/A:N/A] Wound Margin: [9:Thickened] [N/A:N/A] Granulation Amount: [9:Small (1-33%)] [N/A:N/A] Granulation Quality: [9:Red, Pink] [N/A:N/A] Necrotic Amount: [9:Large (67-100%)] [N/A:N/A] Exposed Structures: [9:Fat Layer (Subcutaneous Tissue): Yes Fascia: No Tendon: No Muscle: No Joint: No Bone: No] [N/A:N/A] Treatment Notes Electronic Signature(s) Signed: 08/05/2021 5:32:40 PM By:  [name], BSN, RN, CWS, Leisure centre manager, BSN Entered By: [name], BSN, RN, CWS, [name] on 08/05/2021 14:09:18 [name] ([id number]) -------------------------------------------------------------------------------- [place] Plan Details Patient Name: [name], [name]. Date of Service: 08/05/2021 1:45 PM Medical Record Number: [id number] Patient Account Number: [id number] Date of Birth/Sex: Nov 19, 1945 (75 y.o. M) Treating RN: [name] Primary Care Tod Abrahamsen: Fulton Reek Other Clinician: Referring Amaira Safley: Fulton Reek Treating Novia Lansberry/Extender: Skipper Cliche in Treatment: 1 Active Inactive Wound/Skin Impairment Nursing Diagnoses: Impaired tissue integrity Knowledge deficit related to smoking impact on wound healing Knowledge deficit related to ulceration/compromised skin integrity Goals: Patient/caregiver will verbalize understanding of skin care regimen Date Initiated: 07/28/2021 Target Resolution Date:  08/08/2021 Goal Status: Active Ulcer/skin breakdown will have a volume reduction of 30% by week 4 Date Initiated: 07/28/2021 Target Resolution Date: 08/28/2021 Goal Status: Active Ulcer/skin breakdown will have a volume reduction of 50% by week 8 Date Initiated: 07/28/2021 Target Resolution Date: 09/27/2021 Goal Status: Active Ulcer/skin breakdown will have a volume reduction of 80% by week 12 Date Initiated: 07/28/2021 Target Resolution Date: 10/28/2020 Goal Status: Active Ulcer/skin breakdown will heal within 14 weeks Date Initiated: 07/28/2021 Target Resolution Date: 11/12/2020 Goal Status: Active Interventions: Assess patient/caregiver ability to obtain necessary supplies Assess patient/caregiver ability to perform ulcer/skin care regimen upon admission and as needed Assess ulceration(s) every visit Notes: Electronic Signature(s) Signed: 08/05/2021 5:32:40 PM By: [name], BSN, RN, CWS, [name] RN, BSN Entered By: [name], BSN, RN, CWS, [name] on 08/05/2021 14:08:30 [name] ([id number]) -------------------------------------------------------------------------------- Pain Assessment Details Patient Name: DUVID, SMALLS. Date of Service: 08/05/2021 1:45 PM Medical Record Number: [id number] Patient Account Number: [id number] Date of Birth/Sex: 1946-04-05 (75 y.o. M) Treating RN: [name] Primary Care Claris Pech: Fulton Reek Other Clinician: Referring Cowan Pilar: Fulton Reek Treating Ramsay Bognar/Extender: Skipper Cliche in Treatment: 1 Active Problems Location of Pain Severity and Description of Pain Patient Has Paino No Site Locations Pain Management and Medication Current Pain Management: Electronic Signature(s) Signed: 08/05/2021 5:32:40 PM By: [name], BSN, RN, CWS, [name] RN, BSN Entered By: [name], BSN, RN, CWS, [name] on 08/05/2021 13:52:06 [name]  ([id number]) -------------------------------------------------------------------------------- Patient/Caregiver Education Details Patient Name: ANTWONE, CAPOZZOLI. Date of Service: 08/05/2021 1:45 PM Medical Record Number: [id number] Patient Account Number: [id number] Date of Birth/Gender: [birth year] (75 y.o. M) Treating RN: [name] Primary Care Physician: Fulton Reek Other Clinician: Referring Physician: Fulton Reek Treating Physician/Extender: Skipper Cliche in Treatment: 1 Education Assessment Education Provided To: Patient Education Topics Provided Wound Debridement: Handouts: Wound Debridement Methods: Demonstration, Explain/Verbal Responses: State content correctly Wound/Skin Impairment: Handouts: Caring for Your Ulcer Methods: Demonstration, Explain/Verbal Responses: State content correctly Electronic Signature(s) Signed: 08/05/2021 5:32:40 PM By: [name], BSN, RN, CWS, [name] RN, BSN Entered By: [name], BSN, RN, CWS, [name] on 08/05/2021 14:13:39 [name] ([id number]) -------------------------------------------------------------------------------- Wound Assessment Details Patient Name: LOUI, [place]. Date of Service: 08/05/2021 1:45 PM Medical Record Number: [id number] Patient Account Number: [id number] Date of Birth/Sex: May 26, 1946 (75 y.o. M) Treating RN: [name] Primary Care Lakota Schweppe: Fulton Reek Other Clinician: Referring Kimbley Sprague: Fulton Reek Treating Grahm Etsitty/Extender: Skipper Cliche in Treatment: 1 Wound Status Wound Number: 9 Primary Diabetic Wound/Ulcer of the Lower Extremity Etiology: Wound Location: Left Calcaneus Wound Open Wounding Event: Gradually Appeared Status: Date Acquired: 07/07/2021 Comorbid Cataracts, Arrhythmia, Coronary Artery Disease, Weeks Of Treatment: 1 History: Hypertension, Type II Diabetes, Osteoarthritis, Clustered Wound: No Neuropathy Wound Measurements Length: (cm) 1.8 Width: (cm) 1.2 Depth: (cm)  0.1 Area: (cm) 1.696 Volume: (cm) 0.17 % Reduction in Area:  22.9% % Reduction in Volume: 22.7% Wound Description Classification: Grade 2 Wound Margin: Thickened Exudate Amount: Medium Exudate Type: Serosanguineous Exudate Color: red, brown Foul Odor After Cleansing: No Slough/Fibrino Yes Wound Bed Granulation Amount: Small (1-33%) Exposed Structure Granulation Quality: Red, Pink Fascia Exposed: No Necrotic Amount: Large (67-100%) Fat Layer (Subcutaneous Tissue) Exposed: Yes Necrotic Quality: Adherent Slough Tendon Exposed: No Muscle Exposed: No Joint Exposed: No Bone Exposed: No Treatment Notes Wound #9 (Calcaneus) Wound Laterality: Left Cleanser Normal Saline Discharge Instruction: Wash your hands with soap and water. Remove old dressing, discard into plastic bag and place into trash. Cleanse the wound with Normal Saline prior to applying a clean dressing using gauze sponges, not tissues or cotton balls. Do not scrub or use excessive force. Pat dry using gauze sponges, not tissue or cotton balls. Peri-Wound Care Topical Primary Dressing Silvercel Small 2x2 (in/in) Discharge Instruction: Apply Silvercel Small 2x2 (in/in) as instructed Secondary Dressing Gauze heel cup Discharge Instruction: apply over dressing to protect heel Secured With ACHERON, SUGG ([id number]) 60M Medipore H Soft Cloth Surgical Tape, 2x2 (in/yd) Kerlix Roll Sterile or Non-Sterile 6-ply 4.5x4 (yd/yd) Discharge Instruction: Apply Kerlix as directed Compression Wrap Compression Stockings Add-Ons Electronic Signature(s) Signed: 08/05/2021 5:32:40 PM By: [name], BSN, RN, CWS, [name] RN, BSN Entered By: [name], BSN, RN, CWS, [name] on 08/05/2021 13:56:05 [name] ([id number]) -------------------------------------------------------------------------------- Vitals Details Patient Name: CASSIE, HENKELS. Date of Service: 08/05/2021 1:45 PM Medical Record Number: [id number] Patient Account  Number: [id number] Date of Birth/Sex: 04/02/1946 (75 y.o. M) Treating RN: [name] Primary Care Kynedi Profitt: Fulton Reek Other Clinician: Referring Kenlee Vogt: Fulton Reek Treating Sevan Mcbroom/Extender: Skipper Cliche in Treatment: 1 Vital Signs Time Taken: 13:51 Temperature (F): 97.6 Height (in): 69 Pulse (bpm): 65 Weight (lbs): 267 Respiratory Rate (breaths/min): 16 Body Mass Index (BMI): 39.4 Blood Pressure (mmHg): 161/79 Reference Range: 80 - 120 mg / dl Electronic Signature(s) Signed: 08/05/2021 5:32:40 PM By: [name], BSN, RN, CWS, [name] RN, BSN Entered By: [name], BSN, RN, CWS, [name] on 08/05/2021 13:51:59

## 2021-08-05 NOTE — Progress Notes (Addendum)
CASIN, FEDERICI ([id number]) Visit Report for 08/05/2021 Chief Complaint Document Details Patient Name: [name], [name]. Date of Service: 08/05/2021 1:45 PM Medical Record Number: [id number] Patient Account Number: [id number] Date of Birth/Sex: [birth year] (75 y.o. M) Treating RN: [name] Primary Care Provider: Fulton Reek Other Clinician: Referring Provider: Fulton Reek Treating Provider/Extender: Skipper Cliche in Treatment: 1 Information Obtained from: Patient Chief Complaint Right LE ulcers and cellulitis Electronic Signature(s) Signed: 08/05/2021 2:04:43 PM By: Worthy Keeler PA-C Entered By: Worthy Keeler on 08/05/2021 14:04:43 [name] ([id number]) -------------------------------------------------------------------------------- Debridement Details Patient Name: [name], [name]. Date of Service: 08/05/2021 1:45 PM Medical Record Number: [id number] Patient Account Number: [id number] Date of Birth/Sex: [birth year] (75 y.o. M) Treating RN: [name] Primary Care Provider: Fulton Reek Other Clinician: Referring Provider: Fulton Reek Treating Provider/Extender: Skipper Cliche in Treatment: 1 Debridement Performed for Wound #9 Left Calcaneus Assessment: Performed By: Physician Tommie Sams., PA-C Debridement Type: Debridement Severity of Tissue Pre Debridement: Fat layer exposed Level of Consciousness (Pre- Awake and Alert procedure): Pre-procedure Verification/Time Out Yes - 14:09 Taken: Pain Control: Lidocaine Total Area Debrided (L x W): 1.8 (cm) x 1.2 (cm) = 2.16 (cm) Tissue and other material Viable, Non-Viable, Callus, Slough, Subcutaneous, Slough debrided: Level: Skin/Subcutaneous Tissue Debridement Description: Excisional Instrument: Curette Bleeding: Minimum Hemostasis Achieved: Pressure Response to Treatment: Procedure was tolerated well Level of Consciousness (Post- Awake and Alert procedure): Post Debridement  Measurements of Total Wound Length: (cm) 1.8 Width: (cm) 1.2 Depth: (cm) 0.2 Volume: (cm) 0.339 Character of Wound/Ulcer Post Debridement: Stable Severity of Tissue Post Debridement: Fat layer exposed Post Procedure Diagnosis Same as Pre-procedure Electronic Signature(s) Signed: 08/05/2021 4:43:23 PM By: Worthy Keeler PA-C Signed: 08/05/2021 5:32:40 PM By: [name], BSN, RN, CWS, [name] RN, BSN Entered By: [name], BSN, RN, CWS, [name] on 08/05/2021 14:10:35 [name] ([id number]) -------------------------------------------------------------------------------- HPI Details Patient Name: [name], [name]. Date of Service: 08/05/2021 1:45 PM Medical Record Number: [id number] Patient Account Number: [id number] Date of Birth/Sex: [birth year] (75 y.o. M) Treating RN: [name] Primary Care Provider: Fulton Reek Other Clinician: Referring Provider: Fulton Reek Treating Provider/Extender: Skipper Cliche in Treatment: 1 History of Present Illness HPI Description: 09/24/2020 on evaluation today patient presents today for a heel ulcer that he tells me has been present for about 2 years. He has been seeing podiatry and they have been attempting to manage this including what sounds to be a total contact cast, Unna boot, and just standard dressings otherwise as well. Most recently has been using triple antibiotic ointment. With that being said unfortunately despite everything he really has not had any significant improvement. He tells me that he cannot even really remember exactly how this began but he presumed it may have rubbed on his shoes or something of that nature. 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 He also does have chronic pain in the way of neuropathy which he takes medications for including Cymbalta and  methadone. He tells me that this does seem to help. Fortunately there is no signs of active infection at this time. His most recent hemoglobin A1c was 8.1 though he knows this was this year he cannot tell me the exact time. His fluid pills currently to help with some of the lower extremity edema although he does obviously have signs of  venous stasis/lymphedema. Currently there is no evidence of active infection. No fevers, chills, nausea, vomiting, or diarrhea. Patient has had fairly recent ABIs which were performed on 07/19/2020 and revealed that he has normal findings in both the ankle and toe locations bilaterally. His ABI on the right was 1.09 on the left was 1.08 with a TBI on the right of 0.88 and on the left of 0.94. Triphasic flow was noted throughout. 10/08/2020 on evaluation today patient appears to be doing pretty well in regard to his left heel currently in fact this is doing a great job and seems to be healing quite nicely. Unfortunately on his right leg he had a pile of wood that actually fell on him injuring his right leg this is somewhat erythematous has me concerned little bit about cellulitis though there is not really a good area to culture at this point. 10/24/2020 upon evaluation today patient appears to be doing well with regard to his heel ulcer. He is showing signs of improvement which is great news. His right leg is completely healed. Overall I feel like he is doing excellent and there is no signs of infection. 11/07/2020 upon evaluation today patient appears to be doing well with regard to his heel ulcer. He tells me that last week when he was unable to come in his wife actually thought that the wound was very close to closing if not closed. Then it began to "reopen again". I really feel like what may have happened as the collagen may have dried over the wound bed and that because that misunderstanding with thinking that the wound was healing. With that being said I did not see it  last week I do not know that for certain. Either way I feel like he is doing great today I see no signs of infection at this point. 11/26/2020 upon inspection today patient appears to be doing decently well in regard to his heel ulcer. He has been tolerating the dressing changes without complication. Fortunately there is no sign of active infection at this time. No fevers, chills, nausea, vomiting, or diarrhea. 12/10/2020 upon evaluation today patient appears to be doing fairly well in regard to the wound on his heel as well as what appears to be a new wound of the left first metatarsal head plantar aspect. This seems to be an area that was callus that has split as the patient tells me has been trying to walk on his toes more has probably where this came from. With that being said there does not appear to be signs of active infection which is great news. 12/17/2020 upon evaluation today patient actually appears to be making good progress currently. Fortunately there is no evidence of active infection at this time. Overall I feel like he is very close to complete closure. 12/26/2020 upon evaluation today patient appears to be doing excellent in regard to his wounds. In fact I am not certain that these are not even completely healed on initial inspection. Overall I am very pleased with where things stand today. Good news is that they are healed he is actually get ready to go out of town and that will be helpful as well as he will be a full part of the time. Readmission: 02/04/2021 upon evaluation today patient appears to be doing well at this point in regard to his left heel that I previously saw him for. Unfortunately he is having issues with his right lower extremity. He has significant wounds at this point  he also has erythema noted there is definite signs of cellulitis which is unfortunate. With that being said I think we do need to address this sooner rather than later. The good news is he did have a  nice trip to the beach. He tells me that he had no issues during that time. 4/27; patient on Bactrim. Culture showed methicillin sensitive staph aureus therefore the Bactrim should be effective. 2 small areas on the right leg are healed the area on the mid aspect of the tibia almost 100% covered in a very adherent necrotic debris. We have been using silver alginate 02/20/2021 upon evaluation today patient appears to be doing well with regard to his wound. He is showing signs of improvement which is great news overall very pleased with where things stand today. No fevers, chills, nausea, vomiting, or diarrhea. 02/27/2021 upon evaluation today patient appears to be doing well with regard to his leg ulcer. He is tolerating the dressing changes and overall appears to be doing quite excellent. I am extremely pleased with where things stand and overall I think patient is making great progress. There is no sign of active infection at this time which is also great news. 03/06/2021 upon evaluation today patient appears to be doing excellent in regard to his wounds. In fact he appears to be completely healed today based on what I am seeing. This is excellent news and overall I am extremely pleased with where he stands. Overall the patient is happy to hear this as well this has been quite sometime coming. Readmission: 04/01/2021 patient unfortunately returns for readmission today. He tells me that he has been wearing his compression socks on the right leg daily [name], [name] ([id number]) and he does not really know what is going on and why his legs are doing what they are doing. We did therefore go ahead and probe deeper into exactly what has been going on with him today. Subsequently the patient tells me that when he gets up and what we would call "first thing in the morning" are really 2 different things". He does not tend to sleep well so he tells me that he will often wake up at 3:00 in the morning. He  will then potentially going into the living room to get in his chair where he may read a book for a little while and then potentially fall asleep back in his chair. He then subsequently wake up around 5:00 or so and then get up and in his words "putter around". This often will end with him proceeding at some point around 8 AM or 9 AM to putting on his compression socks. With that being said this means that anywhere from the 3:00 in the morning till roughly around 9:00 in the morning he has no compression on yet he is sitting in his recliner, walking around and up and about, and this is at least 5 to 6 hours of noncompressed time. That may be our issue here but I did not realize until we discussed this further today. Fortunately there does not appear to be any signs of active infection at this time which is great news. With that being said he has multiple wounds of the bilateral lower extremities. 04/10/2021 upon evaluation today patient appears to be doing well with regard to his legs for the most part. He does look like he had some injury where his wrap slid down nonetheless he did some "doctoring on them". I do feel like most of the  areas honestly have cleared back up which is great news. There does not appear to be any signs of active infection at this time which is also great news. No fevers, chills, nausea, vomiting, or diarrhea. 04/18/2021 upon evaluation today patient appears to be doing well with regard to his wounds in fact he appears to be completely healed which is great news. Fortunately there does not appear to be any signs of active infection which is great news. No fevers, chills, nausea, vomiting, or diarrhea. READMISSION 07/28/2021 This is a now 75 year old man we have had in this clinic for quite a bit of this year. 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 He wears compression stockings. 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 He comes in an old running shoes although he says he wears better shoes at home. His history is that he noticed a blister in the right posterior heel. This open. He has been applying Neosporin to it currently the wound measures 2 x 1.4 cm. 100% slough covered. Not really offloading this in any rigorous way. Past medical history is essentially unchanged he has paroxysmal atrial fibrillation on Coumadin type 2 diabetes with a recent hemoglobin A1c of 7 on metformin. ABI in our clinic was 0.98 on the right 08/05/2021 upon evaluation today patient appears to be doing well with regard to his heel ulcer. Fortunately there does not appear to be any signs of active infection at this time. Overall I been very pleased with where things seem to be currently as far as the wound healing is concerned. Again this is first a lot of seeing him Dr. Dellia Nims readmitted him last week. Nonetheless I think we are definitely making some progress here. Electronic Signature(s) Signed: 08/05/2021 2:19:41 PM By: Worthy Keeler PA-C Entered By: Worthy Keeler on 08/05/2021 14:19:41 [name] ([id number]) -------------------------------------------------------------------------------- Physical Exam Details Patient Name: [name], [name] Date of Service: 08/05/2021 1:45 PM Medical Record Number: [id number] Patient Account Number: [id number] Date of Birth/Sex: [birth year] (75 y.o. M) Treating RN: [name] Primary Care Provider: Fulton Reek Other Clinician: Referring Provider: Fulton Reek Treating Provider/Extender: Skipper Cliche in Treatment: 1 Constitutional Well-nourished and well-hydrated in no acute distress. Respiratory normal breathing without difficulty. Psychiatric this patient is [name] to make decisions and demonstrates good insight into disease process. Alert and Oriented x 3. pleasant and cooperative. Notes Upon inspection  patient's wound bed actually showed signs of good granulation epithelization at this point. Fortunately there does not appear to be any evidence of active infection which is great news and overall I am extremely pleased with where we stand. I do not think there is any sign of active infection systemically nor locally at this time which is good news. Postdebridement the wound bed appears to be doing significantly better which is great news. Electronic Signature(s) Signed: 08/05/2021 2:20:21 PM By: Worthy Keeler PA-C Previous Signature: 08/05/2021 2:20:05 PM Version By: Worthy Keeler PA-C Entered By: Worthy Keeler on 08/05/2021 14:20:21 [name] ([id number]) -------------------------------------------------------------------------------- Physician Orders Details Patient Name: [name], [name]. Date of Service: 08/05/2021 1:45 PM Medical Record Number: [id number] Patient Account Number: [id number] Date of Birth/Sex: Mar 01, 1946 (75 y.o. 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 Bathing/ Shower/ Hygiene o May shower; gently cleanse wound with antibacterial soap, rinse and pat dry prior to dressing wounds - keep dressing dry o No tub bath. Edema Control - Lymphedema / Segmental Compressive Device / Other o Patient to wear own compression stockings. Remove compression stockings every night before going to bed and put on every morning when getting up. - right leg o Elevate leg(s) parallel to the floor when  sitting. o DO YOUR BEST to sleep in the bed at night. DO NOT sleep in your recliner. Long hours of sitting in a recliner leads to swelling of the legs and/or potential wounds on your backside. Off-Loading o Other: - L heel offloader shoe Keep pressure off of heel Medications-Please add to medication list. o Other: - check blood sugar and eat protein for wound healing Wound Treatment Wound #9 - Calcaneus Wound Laterality: Left Cleanser: Normal Saline Every Other Day/15 Days Discharge Instructions: Wash your hands with soap and water. Remove old dressing, discard into plastic bag and place into trash. Cleanse the wound with Normal Saline prior to applying a clean dressing using gauze sponges, not tissues or cotton balls. Do not scrub or use excessive force. Pat dry using gauze sponges, not tissue or cotton balls. Primary Dressing: Silvercel Small 2x2 (in/in) (Generic) Every Other Day/15 Days Discharge Instructions: Apply Silvercel Small 2x2 (in/in) as instructed Secondary Dressing: Gauze (Generic) Every Other Day/15 Days Secondary Dressing: heel cup Every Other Day/15 Days Discharge Instructions: apply over dressing to protect heel Secured With: 37M Medipore H Soft Cloth Surgical Tape, 2x2 (in/yd) (Generic) Every Other Day/15 Days Secured With: Kerlix Roll Sterile or Non-Sterile 6-ply 4.5x4 (yd/yd) (Generic) Every Other Day/15 Days Discharge Instructions: Apply Kerlix as directed Electronic Signature(s) Signed: 08/05/2021 4:43:23 PM By: Worthy Keeler PA-C Signed: 08/05/2021 5:32:40 PM By: [name], BSN, RN, CWS, [name] RN, BSN Entered By: [name], BSN, RN, CWS, [name] on 08/05/2021 14:13:02 [name], [name] ([id number]) [name], [name] ([id number]) -------------------------------------------------------------------------------- Problem List Details Patient Name: RASAAN, BROTHERTON. Date of Service: 08/05/2021 1:45 PM Medical Record Number: [id number] Patient Account Number:  [id number] Date of Birth/Sex: 06-22-1946 (75 y.o. 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 Date of Service: 08/05/2021 1:45 PM Medical Record Number: [id number] Patient Account Number: [id number] Date of Birth/Sex: 13-Sep-1946 (75 y.o. M) Treating RN: [name] Primary Care Provider: Fulton Reek Other Clinician: Referring Provider: Fulton Reek Treating Provider/Extender: Skipper Cliche in Treatment: 1 Subjective Chief Complaint Information obtained from Patient Right LE ulcers and cellulitis History of Present Illness (HPI) 09/24/2020 on evaluation today patient presents today for a heel ulcer that he tells me has been present for about 2 years. He has been seeing podiatry and they  have been attempting to manage this including what sounds to be a total contact cast, Unna boot, and just standard dressings otherwise as well. Most recently has been using triple antibiotic ointment. With that being said unfortunately despite everything he really has not had any significant improvement. He tells me that he cannot even really remember exactly how this began but he presumed it may have  rubbed on his shoes or something of that nature. 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 He tells me that this does seem to help. Fortunately there is no signs of active infection at this time. His most recent hemoglobin A1c was 8.1 though he knows this was this year he cannot tell me the exact time. His fluid pills currently to help with some of the lower extremity edema although he does obviously have signs of venous stasis/lymphedema. Currently there is no evidence of active infection. No fevers, chills, nausea, vomiting, or diarrhea. Patient has had fairly recent ABIs which were performed on 07/19/2020 and revealed that he has normal findings in both the ankle and toe locations bilaterally. His ABI on the right was 1.09 on the left was 1.08 with a TBI on the right of 0.88 and on the left of 0.94. Triphasic flow was noted throughout. 10/08/2020 on evaluation today patient appears to be doing pretty well in regard to his left heel currently in fact this is doing a great job and seems to be healing quite nicely. Unfortunately on his right leg he had a pile of wood that actually fell on him injuring his right leg this is somewhat erythematous has me concerned little bit about cellulitis though there is not really a good area to culture at this point. 10/24/2020 upon evaluation today patient appears to be doing well with regard to his heel ulcer. He is showing signs of improvement which is great news. His right leg is completely healed. Overall I feel like he is doing excellent and there is no signs of infection. 11/07/2020 upon evaluation today patient appears to be doing well with regard to his heel ulcer. He tells me that  last week when he was unable to come in his wife actually thought that the wound was very close to closing if not closed. Then it began to "reopen again". I really feel like what may have happened as the collagen may have dried over the wound bed and that because that misunderstanding with thinking that the wound was healing. With that being said I did not see it last week I do not know that for certain. Either way I feel like he is doing great today I see no signs of infection at this point. 11/26/2020 upon inspection today patient appears to be doing decently well in regard to his heel ulcer. He has been tolerating the dressing changes without complication. Fortunately there is no sign of active infection at this time. No fevers, chills, nausea, vomiting, or diarrhea. 12/10/2020 upon evaluation today patient appears to be doing fairly well in regard to the wound on his heel as well as what appears to be a new wound of the left first metatarsal head plantar aspect. This seems to be an area that was callus that  has split as the patient tells me has been trying to walk on his toes more has probably where this came from. With that being said there does not appear to be signs of active infection which is great news. 12/17/2020 upon evaluation today patient actually appears to be making good progress currently. Fortunately there is no evidence of active infection at this time. Overall I feel like he is very close to complete closure. 12/26/2020 upon evaluation today patient appears to be doing excellent in regard to his wounds. In fact I am not certain that these are not even completely healed on initial inspection. Overall I am very pleased with where things stand today. Good news is that they are healed he is actually get ready to go out of town and that will be helpful as well as he will be a full part of the time. Readmission: 02/04/2021 upon evaluation today patient appears to be doing well at this point  in regard to his left heel that I previously saw him for. Unfortunately he is having issues with his right lower extremity. He has significant wounds at this point he also has erythema noted there is definite signs of cellulitis which is unfortunate. With that being said I think we do need to address this sooner rather than later. The good news is he did have a nice trip to the beach. He tells me that he had no issues during that time. 4/27; patient on Bactrim. Culture showed methicillin sensitive staph aureus therefore the Bactrim should be effective. 2 small areas on the right leg are healed the area on the mid aspect of the tibia almost 100% covered in a very adherent necrotic debris. We have been using silver alginate 02/20/2021 upon evaluation today patient appears to be doing well with regard to his wound. He is showing signs of improvement which is great news overall very pleased with where things stand today. No fevers, chills, nausea, vomiting, or diarrhea. 02/27/2021 upon evaluation today patient appears to be doing well with regard to his leg ulcer. He is tolerating the dressing changes and overall appears to be doing quite excellent. I am extremely pleased with where things stand and overall I think patient is making great progress. There is no sign of active infection at this time which is also great news. 03/06/2021 upon evaluation today patient appears to be doing excellent in regard to his wounds. In fact he appears to be completely healed today based on what I am seeing. This is excellent news and overall I am extremely pleased with where he stands. Overall the patient is happy to hear [name], [name] ([id number]) this as well this has been quite sometime coming. Readmission: 04/01/2021 patient unfortunately returns for readmission today. He tells me that he has been wearing his compression socks on the right leg daily and he does not really know what is going on and why his legs are  doing what they are doing. We did therefore go ahead and probe deeper into exactly what has been going on with him today. Subsequently the patient tells me that when he gets up and what we would call "first thing in the morning" are really 2 different things". He does not tend to sleep well so he tells me that he will often wake up at 3:00 in the morning. He will then potentially going into the living room to get in his chair where he may read a book for a little while and then potentially  fall asleep back in his chair. He then subsequently wake up around 5:00 or so and then get up and in his words "putter around". This often will end with him proceeding at some point around 8 AM or 9 AM to putting on his compression socks. With that being said this means that anywhere from the 3:00 in the morning till roughly around 9:00 in the morning he has no compression on yet he is sitting in his recliner, walking around and up and about, and this is at least 5 to 6 hours of noncompressed time. That may be our issue here but I did not realize until we discussed this further today. Fortunately there does not appear to be any signs of active infection at this time which is great news. With that being said he has multiple wounds of the bilateral lower extremities. 04/10/2021 upon evaluation today patient appears to be doing well with regard to his legs for the most part. He does look like he had some injury where his wrap slid down nonetheless he did some "doctoring on them". I do feel like most of the areas honestly have cleared back up which is great news. There does not appear to be any signs of active infection at this time which is also great news. No fevers, chills, nausea, vomiting, or diarrhea. 04/18/2021 upon evaluation today patient appears to be doing well with regard to his wounds in fact he appears to be completely healed which is great news. Fortunately there does not appear to be any signs of active  infection which is great news. No fevers, chills, nausea, vomiting, or diarrhea. READMISSION 07/28/2021 This is a now 75 year old man we have had in this clinic for quite a bit of this year. 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 He wears compression stockings. 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 He comes in an old running shoes although he says he wears better shoes at home. His history is that he noticed a blister in the right posterior heel. This open. He has been applying Neosporin to it currently the wound measures 2 x 1.4 cm. 100% slough covered. Not really offloading this in any rigorous way. Past medical history is essentially unchanged he has paroxysmal atrial fibrillation on Coumadin type 2 diabetes with a recent hemoglobin A1c of 7 on metformin. ABI in our clinic was 0.98 on the right 08/05/2021 upon evaluation today patient appears to be doing well with regard to his heel ulcer. Fortunately there does not appear to be any signs of active infection at this time. Overall I been very pleased with where things seem to be currently as far as the wound healing is concerned. Again this is first a lot of seeing him Dr. Dellia Nims readmitted him last week. Nonetheless I think we are definitely making some progress here. Objective Constitutional Well-nourished and well-hydrated in no acute distress. Vitals Time Taken: 1:51 PM, Height: 69 in, Weight: 267 lbs, BMI: 39.4, Temperature: 97.6 F, Pulse: 65 bpm, Respiratory Rate: 16 breaths/min, Blood Pressure: 161/79 mmHg. Respiratory normal breathing without difficulty. Psychiatric this patient is [name] to make decisions and demonstrates good insight into disease process. Alert and Oriented x 3. pleasant and cooperative. General Notes: Upon inspection patient's wound bed actually showed signs of good granulation epithelization at  this point. Fortunately there does not appear to be any evidence of active infection which is great news  and overall I am extremely pleased with where we stand. I do not think there is any sign of active infection systemically nor locally at this time which is good news. Postdebridement the wound bed appears to be doing significantly better which is great news. Integumentary (Hair, Skin) Wound #9 status is Open. Original cause of wound was Gradually Appeared. The date acquired was: 07/07/2021. The wound has been in treatment 1 weeks. The wound is located on the Left Calcaneus. The wound measures 1.8cm length x 1.2cm width x 0.1cm depth; 1.696cm^2 area and 0.17cm^3 volume. There is Fat Layer (Subcutaneous Tissue) exposed. There is a medium amount of serosanguineous drainage noted. The wound margin is thickened. There is small (1-33%) red, pink granulation within the wound bed. There is a large (67-100%) amount of necrotic tissue [name], [name] ([id number]) within the wound bed including Adherent Slough. 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 There was a Excisional Skin/Subcutaneous Tissue Debridement with a total area of 2.16 sq cm performed by Tommie Sams., PA-C. With the following instrument(s): Curette to remove Viable and Non-Viable tissue/material. Material removed includes Callus, Subcutaneous Tissue, and Slough after achieving pain control using Lidocaine. No specimens were taken. A time out was conducted at 14:09, prior to the start of the procedure. A Minimum amount of bleeding was controlled with Pressure. The procedure  was tolerated well. Post Debridement Measurements: 1.8cm length x 1.2cm width x 0.2cm depth; 0.339cm^3 volume. Character of Wound/Ulcer Post Debridement is stable. Severity of Tissue Post Debridement is: Fat layer exposed. Post procedure Diagnosis Wound #9: Same as Pre-Procedure Plan Follow-up Appointments: Return Appointment in 1 week. Bathing/ Shower/ Hygiene: May shower; gently cleanse wound with antibacterial soap, rinse and pat dry prior to dressing wounds - keep dressing dry No tub bath. Edema Control - Lymphedema / Segmental Compressive Device / Other: Patient to wear own compression stockings. Remove compression stockings every night before going to bed and put on every morning when getting up. - right leg Elevate leg(s) parallel to the floor when sitting. DO YOUR BEST to sleep in the bed at night. DO NOT sleep in your recliner. Long hours of sitting in a recliner leads to swelling of the legs and/or potential wounds on your backside. Off-Loading: Other: - L heel offloader shoe Keep pressure off of heel Medications-Please add to medication list.: Other: - check blood sugar and eat protein for wound healing WOUND #9: - Calcaneus Wound Laterality: Left Cleanser: Normal Saline Every Other Day/15 Days Discharge Instructions: Wash your hands with soap and water. Remove old dressing, discard into plastic bag and place into trash. Cleanse the wound with Normal Saline prior to applying a clean dressing using gauze sponges, not tissues or cotton balls. Do not scrub or use excessive force. Pat dry using gauze sponges, not tissue or cotton balls. Primary Dressing: Silvercel Small 2x2 (in/in) (Generic) Every Other Day/15 Days Discharge Instructions: Apply Silvercel Small 2x2 (in/in) as instructed Secondary Dressing: Gauze (Generic) Every Other Day/15 Days Secondary Dressing: heel cup Every Other Day/15 Days Discharge Instructions: apply over dressing to protect heel Secured With: 36M  Medipore H Soft Cloth Surgical Tape, 2x2 (in/yd) (Generic) Every Other Day/15 Days Secured  With: Kerlix Roll Sterile or Non-Sterile 6-ply 4.5x4 (yd/yd) (Generic) Every Other Day/15 Days Discharge Instructions: Apply Kerlix as directed 1. I would recommend currently that we going continue with the silver alginate dressing I think this is still a good way to go. 2. Muscle can recommend we continue with a heel cup which I think is doing well for him. 3. I would also recommend he continue to use the heel offloading shoe which I do believe is doing well. [name], [name] ([id number]) We will see patient back for reevaluation in 1 week here in the clinic. If anything worsens or changes patient will contact our office for additional recommendations. Electronic Signature(s) Signed: 08/05/2021 2:20:40 PM By: Worthy Keeler PA-C Entered By: Worthy Keeler on 08/05/2021 14:20:39 [name] ([id number]) -------------------------------------------------------------------------------- SuperBill Details Patient Name: [name], [name]. Date of Service: 08/05/2021 Medical Record Number: [id number] Patient Account Number: [id number] Date of Birth/Sex: 1945/12/06 (75 y.o. M) Treating RN: [name] Primary Care Provider: Fulton Reek Other Clinician: Referring Provider: Fulton Reek Treating Provider/Extender: Skipper Cliche in Treatment: 1 Diagnosis Coding ICD-10 Codes Code Description [phone] Type 2 diabetes mellitus with foot ulcer L97.421 Non-pressure chronic ulcer of left heel and midfoot limited to breakdown of skin E11.42 Type 2 diabetes mellitus with diabetic polyneuropathy Facility Procedures CPT4 Code: [id number] Description: 84132 - DEB SUBQ TISSUE 20 SQ CM/< Modifier: Quantity: 1 CPT4 Code: Description: ICD-10 Diagnosis Description L97.421 Non-pressure chronic ulcer of left heel and midfoot limited to breakdow Modifier: n of skin Quantity: Physician Procedures CPT4  Code: 4401027 Description: 11042 - WC PHYS SUBQ TISS 20 SQ CM Modifier: Quantity: 1 CPT4 Code: Description: ICD-10 Diagnosis Description L97.421 Non-pressure chronic ulcer of left heel and midfoot limited to breakdow Modifier: n of skin Quantity: Electronic Signature(s) Signed: 08/05/2021 2:20:47 PM By: Worthy Keeler PA-C Entered By: Worthy Keeler on 08/05/2021 14:20:47

## 2021-08-06 ENCOUNTER — Ambulatory Visit
Admission: RE | Admit: 2021-08-06 | Discharge: 2021-08-06 | Disposition: A | Discharge status: Home / Self Care | Payer: Medicare HMO | Attending: Internal Medicine | Admitting: Internal Medicine

## 2021-08-06 ENCOUNTER — Ambulatory Visit: Payer: Medicare HMO | Admitting: Anesthesiology

## 2021-08-06 ENCOUNTER — Encounter
Admission: RE | Disposition: A | Discharge status: Home / Self Care | Payer: Self-pay | Source: Home / Self Care | Attending: Internal Medicine

## 2021-08-06 DIAGNOSIS — K640 First degree hemorrhoids: Secondary | ICD-10-CM | POA: Insufficient documentation

## 2021-08-06 DIAGNOSIS — Z86010 Personal history of colonic polyps: Secondary | ICD-10-CM | POA: Insufficient documentation

## 2021-08-06 DIAGNOSIS — Z881 Allergy status to other antibiotic agents status: Secondary | ICD-10-CM | POA: Insufficient documentation

## 2021-08-06 DIAGNOSIS — Z885 Allergy status to narcotic agent status: Secondary | ICD-10-CM | POA: Diagnosis not present

## 2021-08-06 DIAGNOSIS — Z1211 Encounter for screening for malignant neoplasm of colon: Secondary | ICD-10-CM | POA: Diagnosis not present

## 2021-08-06 DIAGNOSIS — Z7984 Long term (current) use of oral hypoglycemic drugs: Secondary | ICD-10-CM | POA: Diagnosis not present

## 2021-08-06 DIAGNOSIS — Z7982 Long term (current) use of aspirin: Secondary | ICD-10-CM | POA: Insufficient documentation

## 2021-08-06 DIAGNOSIS — Z87891 Personal history of nicotine dependence: Secondary | ICD-10-CM | POA: Insufficient documentation

## 2021-08-06 DIAGNOSIS — K5730 Diverticulosis of large intestine without perforation or abscess without bleeding: Secondary | ICD-10-CM | POA: Insufficient documentation

## 2021-08-06 DIAGNOSIS — Z79899 Other long term (current) drug therapy: Secondary | ICD-10-CM | POA: Insufficient documentation

## 2021-08-06 DIAGNOSIS — Z7901 Long term (current) use of anticoagulants: Secondary | ICD-10-CM | POA: Diagnosis not present

## 2021-08-06 DIAGNOSIS — Z886 Allergy status to analgesic agent status: Secondary | ICD-10-CM | POA: Diagnosis not present

## 2021-08-06 HISTORY — DX: Anemia, unspecified: D64.9

## 2021-08-06 HISTORY — PX: COLONOSCOPY WITH PROPOFOL: SHX5780

## 2021-08-06 LAB — GLUCOSE, CAPILLARY: Glucose-Capillary: 134 mg/dL — ABNORMAL HIGH (ref 70–99)

## 2021-08-06 SURGERY — COLONOSCOPY WITH PROPOFOL
Anesthesia: General

## 2021-08-06 MED ORDER — PHENYLEPHRINE HCL (PRESSORS) 10 MG/ML IV SOLN
INTRAVENOUS | Status: DC | PRN
  Administered 2021-08-06: 100 ug via INTRAVENOUS

## 2021-08-06 MED ORDER — PROPOFOL 500 MG/50ML IV EMUL
INTRAVENOUS | Status: DC | PRN
  Administered 2021-08-06: 200 ug/kg/min via INTRAVENOUS

## 2021-08-06 MED ORDER — LIDOCAINE HCL (CARDIAC) PF 100 MG/5ML IV SOSY
PREFILLED_SYRINGE | INTRAVENOUS | Status: DC | PRN
  Administered 2021-08-06: 60 mg via INTRAVENOUS

## 2021-08-06 MED ORDER — PROPOFOL 10 MG/ML IV BOLUS
INTRAVENOUS | Status: DC | PRN
  Administered 2021-08-06: 60 mg via INTRAVENOUS

## 2021-08-06 MED ORDER — SODIUM CHLORIDE 0.9 % IV SOLN
INTRAVENOUS | Status: DC
  Administered 2021-08-06: 20 mL/h via INTRAVENOUS

## 2021-08-06 NOTE — Op Note (Signed)
[facility] Gastroenterology Patient Name: [name] Procedure Date: 08/06/2021 10:32 AM MRN: [id number] Account #: [id number] Date of Birth: 1946-05-07 Admit Type: Outpatient Age: 75 Room: [facility] ENDO ROOM 2 Gender: Male Note Status: Finalized Instrument Name: [name] [id number] Procedure:             Colonoscopy Indications:           Surveillance: Personal history of adenomatous polyps                         on last colonoscopy > 3 years ago Providers:             Lorie Apley K. Alice Reichert MD, MD Referring MD:          Leonie Douglas. Doy Hutching, MD (Referring MD) Medicines:             Propofol per Anesthesia Complications:         No immediate complications. Procedure:             Pre-Anesthesia Assessment:                        - The risks and benefits of the procedure and the                         sedation options and risks were discussed with the                         patient. All questions were answered and informed                         consent was obtained.                        - Patient identification and proposed procedure were                         verified prior to the procedure by the nurse. The                         procedure was verified in the procedure room.                        - ASA Grade Assessment: III - A patient with severe                         systemic disease.                        - After reviewing the risks and benefits, the patient                         was deemed in satisfactory condition to undergo the                         procedure.                        After obtaining informed consent, the colonoscope was  passed under direct vision. Throughout the procedure,                         the patient's blood pressure, pulse, and oxygen                         saturations were monitored continuously. The                         Colonoscope was introduced through the anus and                          advanced to the the cecum, identified by appendiceal                         orifice and ileocecal valve. The colonoscopy was                         performed without difficulty. The patient tolerated                         the procedure well. The quality of the bowel                         preparation was adequate. The ileocecal valve,                         appendiceal orifice, and rectum were photographed. Findings:      The perianal and digital rectal examinations were normal. Pertinent       negatives include normal sphincter tone and no palpable rectal lesions.      Non-bleeding internal hemorrhoids were found during retroflexion. The       hemorrhoids were Grade I (internal hemorrhoids that do not prolapse).      A few small-mouthed diverticula were found in the sigmoid colon.      The exam was otherwise without abnormality. Impression:            - Non-bleeding internal hemorrhoids.                        - Diverticulosis in the sigmoid colon.                        - The examination was otherwise normal.                        - No specimens collected. Recommendation:        - Patient has a contact number available for                         emergencies. The signs and symptoms of potential                         delayed complications were discussed with the patient.                         Return to normal activities tomorrow. Written  discharge instructions were provided to the patient.                        - Resume previous diet.                        - Continue present medications.                        - No repeat colonoscopy due to current age (28 years                         or older) and the absence of colonic polyps.                        - You do NOT require further colon cancer screening                         measures (Annual stool testing (i.e. hemoccult, FIT,                         cologuard), sigmoidoscopy, colonoscopy or CT                          colonography). You should share this recommendation                         with your Primary Care provider. Procedure Code(s):     --- Professional ---                        S9628, Colorectal cancer screening; colonoscopy on                         individual at high risk Diagnosis Code(s):     --- Professional ---                        K57.30, Diverticulosis of large intestine without                         perforation or abscess without bleeding                        K64.0, First degree hemorrhoids                        Z86.010, Personal history of colonic polyps CPT copyright 2019 American Medical Association. All rights reserved. The codes documented in this report are preliminary and upon coder review may  be revised to meet current compliance requirements. Efrain Sella MD, MD 08/06/2021 11:05:18 AM This report has been signed electronically. Number of Addenda: 0 Note Initiated On: 08/06/2021 10:32 AM Scope Withdrawal Time: 0 hours 7 minutes 18 seconds  Total Procedure Duration: 0 hours 10 minutes 28 seconds  Estimated Blood Loss:  Estimated blood loss: none.      [facility]

## 2021-08-06 NOTE — Interval H&P Note (Signed)
History and Physical Interval Note:  08/06/2021 9:37 AM  [name]  has presented today for surgery, with the diagnosis of PRS HX COLON POLYP.  The various methods of treatment have been discussed with the patient and family. After consideration of risks, benefits and other options for treatment, the patient has consented to  Procedure(s): COLONOSCOPY WITH PROPOFOL (N/A) as a surgical intervention.  The patient's history has been reviewed, patient examined, no change in status, stable for surgery.  I have reviewed the patient's chart and labs.  Questions were answered to the patient's satisfaction.     Sebastian, [place]

## 2021-08-06 NOTE — H&P (Signed)
Outpatient short stay form Pre-procedure 08/06/2021 9:37 AM [name] K. Alice Reichert, M.D.  Primary Physician: Fulton Reek, M.D.  Reason for visit:  Personal history of adenomatous colon polyps on 05/16/2018 colonoscopy.  History of present illness:                            Patient presents for colonoscopy for a personal hx of colon polyps. The patient denies abdominal pain, abnormal weight loss or rectal bleeding.     No current facility-administered medications for this encounter.  Medications Prior to Admission  Medication Sig Dispense Refill Last Dose   cyanocobalamin 1000 MCG tablet Take 1,000 mcg by mouth daily.      amiodarone (PACERONE) 200 MG tablet Take 1 tablet (200 mg total) by mouth daily. 30 tablet 1    amitriptyline (ELAVIL) 10 MG tablet Take by mouth.      aspirin EC 81 MG tablet Take 81 mg by mouth daily.      cyclobenzaprine (FLEXERIL) 10 MG tablet Take 10 mg by mouth daily as needed for muscle spasms.       DULoxetine (CYMBALTA) 30 MG capsule Take 30 mg by mouth every evening.      ferrous sulfate 325 (65 FE) MG tablet Take 1 tablet (325 mg total) by mouth 2 (two) times daily with a meal. 60 tablet 3    furosemide (LASIX) 40 MG tablet Take 40 mg by mouth daily.  (Patient not taking: Reported on 07/19/2020)      gabapentin (NEURONTIN) 600 MG tablet Take 600 mg by mouth 2 (two) times daily.      KLOR-CON M20 20 MEQ tablet Take by mouth.      loratadine (CLARITIN) 10 MG tablet Take 10 mg by mouth daily.      meclizine (ANTIVERT) 25 MG tablet Take 1 tablet by mouth 3 (three) times daily.      metFORMIN (GLUCOPHAGE) 1000 MG tablet Take 1,000 mg by mouth 2 (two) times daily. With meals.      methadone (DOLOPHINE) 5 MG tablet Take 5 mg by mouth 2 (two) times daily.       metoprolol tartrate (LOPRESSOR) 25 MG tablet Take 25 mg by mouth 2 (two) times daily.       Multiple Vitamin (MULTI-VITAMINS) TABS Take 1 tablet by mouth daily.      pantoprazole (PROTONIX) 40 MG tablet Take  40 mg by mouth daily.      pioglitazone (ACTOS) 30 MG tablet Take 30 mg by mouth daily.      pravastatin (PRAVACHOL) 40 MG tablet Take 40 mg by mouth every evening.       torsemide (DEMADEX) 20 MG tablet Take by mouth.      warfarin (COUMADIN) 5 MG tablet Take 5 mg by mouth daily.        Allergies  Allergen Reactions   Acetaminophen-Codeine Other (See Comments)    Other reaction(s): Headache Other reaction(s): Headache   Demeclocycline Hives   Tetracycline Hives   Tetracyclines & Related Hives     Past Medical History:  Diagnosis Date   Anemia    CHF (congestive heart failure) (HCC)    Chronic pain    Coronary artery disease    DDD (degenerative disc disease), hip    Diabetes mellitus without complication (HCC)    DJD (degenerative joint disease)    Dysrhythmia    GERD (gastroesophageal reflux disease)    Hyperlipemia    Hypertension  Insomnia    Neuropathy    Paroxysmal A-fib (HCC)    Peripheral neuropathy    Stroke [facility])    Thoracic aortic atherosclerosis ([place])     Review of systems:  Otherwise negative.    Physical Exam  Gen: Alert, oriented. Appears stated age.  HEENT: [place]/AT. PERRLA. Lungs: CTA, no wheezes. CV: RR nl S1, S2. Abd: soft, benign, no masses. BS+ Ext: No edema. Pulses 2+    Planned procedures: Proceed with colonoscopy. The patient understands the nature of the planned procedure, indications, risks, alternatives and potential complications including but not limited to bleeding, infection, perforation, damage to internal organs and possible oversedation/side effects from anesthesia. The patient agrees and gives consent to proceed.  Please refer to procedure notes for findings, recommendations and patient disposition/instructions.     Nelani Schmelzle K. Alice Reichert, M.D. Gastroenterology 08/06/2021  9:37 AM

## 2021-08-06 NOTE — Anesthesia Preprocedure Evaluation (Signed)
Anesthesia Evaluation  Patient identified by MRN, date of birth, ID band Patient awake    Reviewed: Allergy & Precautions, H&P , NPO status , Patient's Chart, lab work & pertinent test results, reviewed documented beta blocker date and time   History of Anesthesia Complications Negative for: history of anesthetic complications  Airway Mallampati: III  TM Distance: <3 FB Neck ROM: limited    Dental  (+) Edentulous Upper, Edentulous Lower   Pulmonary neg shortness of breath, former smoker,    Pulmonary exam normal        Cardiovascular Exercise Tolerance: Good hypertension, Pt. on medications and Pt. on home beta blockers + CAD, + Peripheral Vascular Disease and +CHF  (-) Past MI Normal cardiovascular exam+ dysrhythmias Atrial Fibrillation      Neuro/Psych TIA Neuromuscular disease CVA (Occ Dizziness), Residual Symptoms negative psych ROS   GI/Hepatic Neg liver ROS, Bowel prep,GERD  Controlled and Medicated,  Endo/Other  diabetes, Type 2  Renal/GU negative Renal ROS  negative genitourinary   Musculoskeletal  (+) Arthritis ,   Abdominal   Peds  Hematology negative hematology ROS (+) anemia ,   Anesthesia Other Findings Anemia    CHF (congestive heart failure) (HCC) Normal LVF  Normal Wall Motion  EF=60%  Chronic pain    Coronary artery disease  DDD (degenerative disc disease), hip Diabetes mellitus without complication (HCC)    DJD (degenerative joint disease) Dysrhythmia    GERD (gastroesophageal reflux disease) Hyperlipemia    Hypertension    Insomnia    Neuropathy    Paroxysmal A-fib (HCC)   Peripheral neuropathy    Stroke [facility])    Thoracic aortic atherosclerosis (HCC)        Reproductive/Obstetrics negative OB ROS                             Anesthesia Physical  Anesthesia Plan  ASA: 4  Anesthesia Plan: General   Post-op Pain Management:    Induction:  Intravenous  PONV Risk Score and Plan: 2 and Propofol infusion and TIVA  Airway Management Planned: Natural Airway and Nasal Cannula  Additional Equipment:   Intra-op Plan:   Post-operative Plan:   Informed Consent: I have reviewed the patients History and Physical, chart, labs and discussed the procedure including the risks, benefits and alternatives for the proposed anesthesia with the patient or authorized representative who has indicated his/her understanding and acceptance.       Plan Discussed with: Anesthesiologist, CRNA and Surgeon  Anesthesia Plan Comments: (Patient consented for risks of anesthesia including but not limited to:  - adverse reactions to medications - risk of intubation if required - damage to teeth, lips or other oral mucosa - sore throat or hoarseness - Damage to heart, brain, lungs or loss of life  Patient voiced understanding.)        Anesthesia Quick Evaluation

## 2021-08-06 NOTE — Transfer of Care (Signed)
Immediate Anesthesia Transfer of Care Note  Patient: [name]  Procedure(s) Performed: COLONOSCOPY WITH PROPOFOL  Patient Location: PACU  Anesthesia Type:General  Level of Consciousness: sedated  Airway & Oxygen Therapy: Patient Spontanous Breathing and Patient connected to nasal cannula oxygen  Post-op Assessment: Report given to RN  Post vital signs: stable  Last Vitals:  Vitals Value Taken Time  BP 114/67 08/06/21 1106  Temp 36.6 C 08/06/21 1106  Pulse 71 08/06/21 1108  Resp 14 08/06/21 1108  SpO2 89 % 08/06/21 1108  Vitals shown include unvalidated device data.  Last Pain:  Vitals:   08/06/21 1106  TempSrc: Temporal  PainSc: Asleep         Complications: No notable events documented.

## 2021-08-06 NOTE — Anesthesia Postprocedure Evaluation (Signed)
Anesthesia Post Note  Patient: [name]  Procedure(s) Performed: COLONOSCOPY WITH PROPOFOL  Patient location during evaluation: Phase II Anesthesia Type: General Level of consciousness: awake and alert, awake and oriented Pain management: pain level controlled Vital Signs Assessment: post-procedure vital signs reviewed and stable Respiratory status: spontaneous breathing, nonlabored ventilation and respiratory function stable Cardiovascular status: blood pressure returned to baseline and stable Postop Assessment: no apparent nausea or vomiting Anesthetic complications: no   No notable events documented.   Last Vitals:  Vitals:   08/06/21 1106 08/06/21 1136  BP: 114/67 136/81  Pulse: 65   Resp: 15   Temp: 36.6 C   SpO2: 93%     Last Pain:  Vitals:   08/06/21 1126  TempSrc:   PainSc: 0-No pain                 Phill Mutter

## 2021-08-07 ENCOUNTER — Encounter: Payer: Self-pay | Admitting: Internal Medicine

## 2021-08-11 ENCOUNTER — Encounter: Payer: Medicare HMO | Admitting: Physician Assistant

## 2021-08-11 ENCOUNTER — Other Ambulatory Visit: Payer: Self-pay

## 2021-08-11 DIAGNOSIS — L97421 Non-pressure chronic ulcer of left heel and midfoot limited to breakdown of skin: Secondary | ICD-10-CM | POA: Diagnosis not present

## 2021-08-11 NOTE — Progress Notes (Signed)
OSINACHI, NAVARRETTE ([id number]) Visit Report for 08/11/2021 Arrival Information Details Patient Name: [name], [name]. Date of Service: 08/11/2021 1:00 PM Medical Record Number: [id number] Patient Account Number: [id number] Date of Birth/Sex: 1946/03/07 (75 y.o. M) Treating RN: Donnamarie Poag Primary Care Kiaan Overholser: Fulton Reek Other Clinician: Referring Ellenora Talton: Fulton Reek Treating Kariel Skillman/Extender: Skipper Cliche in Treatment: 2 Visit Information History Since Last Visit Added or deleted any medications: No Patient Arrived: [name] Had a fall or experienced change in No Arrival Time: 13:05 activities of daily living that may affect Accompanied By: self risk of falls: Transfer Assistance: None Hospitalized since last visit: No Patient Identification Verified: Yes Has Dressing in Place as Prescribed: Yes Secondary Verification Process Completed: Yes Pain Present Now: No Patient Has Alerts: Yes Patient Alerts: Patient on Blood Thinner Coumadin Diabetic aspirin 16m Electronic Signature(s) Signed: 08/11/2021 2:30:33 PM By: BDonnamarie PoagEntered By: BDonnamarie Poagon 08/11/2021 13:07:50 [name]([id number] -------------------------------------------------------------------------------- Clinic Level of Care Assessment Details Patient Name: [name] Date of Service: 08/11/2021 1:00 PM Medical Record Number: [id number] Account Number: [id number] of Birth/Sex: 9April 07, 1947(75y.o. M) Treating RN: BDonnamarie PoagPrimary Care Kida Digiulio: SFulton ReekOther Clinician: Referring Terez Freimark: SFulton ReekTreating Zaydrian Batta/Extender: SSkipper Clichein Treatment: 2 Clinic Level of Care Assessment Items TOOL 4 Quantity Score '[]'  - Use when only an EandM is performed on FOLLOW-UP visit 0 ASSESSMENTS - Nursing Assessment / Reassessment '[]'  - Reassessment of Co-morbidities (includes updates in patient status) 0 '[]'  - 0 Reassessment of Adherence to  Treatment Plan ASSESSMENTS - Wound and Skin Assessment / Reassessment X - Simple Wound Assessment / Reassessment - one wound 1 5 '[]'  - 0 Complex Wound Assessment / Reassessment - multiple wounds '[]'  - 0 Dermatologic / Skin Assessment (not related to wound area) ASSESSMENTS - Focused Assessment '[]'  - Circumferential Edema Measurements - multi extremities 0 '[]'  - 0 Nutritional Assessment / Counseling / Intervention '[]'  - 0 Lower Extremity Assessment (monofilament, tuning fork, pulses) '[]'  - 0 Peripheral Arterial Disease Assessment (using hand held doppler) ASSESSMENTS - Ostomy and/or Continence Assessment and Care '[]'  - Incontinence Assessment and Management 0 '[]'  - 0 Ostomy Care Assessment and Management (repouching, etc.) PROCESS - Coordination of Care X - Simple Patient / Family Education for ongoing care 1 15 '[]'  - 0 Complex (extensive) Patient / Family Education for ongoing care '[]'  - 0 Staff obtains CProgrammer, systems Records, Test Results / Process Orders '[]'  - 0 Staff telephones HHA, Nursing Homes / Clarify orders / etc '[]'  - 0 Routine Transfer to another Facility (non-emergent condition) '[]'  - 0 Routine Hospital Admission (non-emergent condition) '[]'  - 0 New Admissions / IBiomedical engineer/ Ordering NPWT, Apligraf, etc. '[]'  - 0 Emergency Hospital Admission (emergent condition) X- 1 10 Simple Discharge Coordination '[]'  - 0 Complex (extensive) Discharge Coordination PROCESS - Special Needs '[]'  - Pediatric / Minor Patient Management 0 '[]'  - 0 Isolation Patient Management '[]'  - 0 Hearing / Language / Visual special needs '[]'  - 0 Assessment of Community assistance (transportation, D/C planning, etc.) '[]'  - 0 Additional assistance / Altered mentation '[]'  - 0 Support Surface(s) Assessment (bed, cushion, seat, etc.) INTERVENTIONS - Wound Cleansing / Measurement FALDRIDGE, KRZYZANOWSKI([id number] X- 1 5 Simple Wound Cleansing - one wound '[]'  - 0 Complex Wound Cleansing - multiple wounds X-  1 5 Wound Imaging (photographs - any number of wounds) '[]'  - 0 Wound Tracing (instead of photographs) X- 1 5 Simple Wound Measurement - one wound '[]'  -  0 Complex Wound Measurement - multiple wounds INTERVENTIONS - Wound Dressings X - Small Wound Dressing one or multiple wounds 1 10 '[]'  - 0 Medium Wound Dressing one or multiple wounds '[]'  - 0 Large Wound Dressing one or multiple wounds X- 1 5 Application of Medications - topical '[]'  - 0 Application of Medications - injection INTERVENTIONS - Miscellaneous '[]'  - External ear exam 0 '[]'  - 0 Specimen Collection (cultures, biopsies, blood, body fluids, etc.) '[]'  - 0 Specimen(s) / Culture(s) sent or taken to Lab for analysis '[]'  - 0 Patient Transfer (multiple staff / Civil Service fast streamer / Similar devices) '[]'  - 0 Simple Staple / Suture removal (25 or less) '[]'  - 0 Complex Staple / Suture removal (26 or more) '[]'  - 0 Hypo / Hyperglycemic Management (close monitor of Blood Glucose) '[]'  - 0 Ankle / Brachial Index (ABI) - do not check if billed separately X- 1 5 Vital Signs Has the patient been seen at the hospital within the last three years: Yes Total Score: 65 Level Of Care: New/Established - Level 2 Electronic Signature(s) Signed: 08/11/2021 2:30:33 PM By: Donnamarie Poag Entered By: Donnamarie Poag on 08/11/2021 13:37:24 [name] ([id number]) -------------------------------------------------------------------------------- Encounter Discharge Information Details Patient Name: [name], [name]. Date of Service: 08/11/2021 1:00 PM Medical Record Number: [id number] Patient Account Number: [id number] Date of Birth/Sex: [birth year] (75 y.o. M) Treating RN: Donnamarie Poag Primary Care Sophira Rumler: Fulton Reek Other Clinician: Referring Rowene Suto: Fulton Reek Treating Jaxton Casale/Extender: Skipper Cliche in Treatment: 2 Encounter Discharge Information Items Discharge Condition: Stable Ambulatory Status: Cane Discharge Destination:  Home Transportation: Private Auto Accompanied By: self Schedule Follow-up Appointment: Yes Clinical Summary of Care: Electronic Signature(s) Signed: 08/11/2021 2:30:33 PM By: Donnamarie Poag Entered By: Donnamarie Poag on 08/11/2021 13:46:47 [name] ([id number]) -------------------------------------------------------------------------------- Lower Extremity Assessment Details Patient Name: [name], [name]. Date of Service: 08/11/2021 1:00 PM Medical Record Number: [id number] Patient Account Number: [id number] Date of Birth/Sex: 07/30/1946 (75 y.o. M) Treating RN: Donnamarie Poag Primary Care Siani Utke: Fulton Reek Other Clinician: Referring Traevion Poehler: Fulton Reek Treating Dmitriy Gair/Extender: Jeri Cos Weeks in Treatment: 2 Edema Assessment Assessed: [Left: Yes] [Right: No] Edema: [Left: N] [Right: o] Vascular Assessment Pulses: Dorsalis Pedis Palpable: [Left:No Yes] Electronic Signature(s) Signed: 08/11/2021 2:30:33 PM By: Donnamarie Poag Entered By: Donnamarie Poag on 08/11/2021 13:14:12 [name] ([id number]) -------------------------------------------------------------------------------- Multi Wound Chart Details Patient Name: [name], [name]. Date of Service: 08/11/2021 1:00 PM Medical Record Number: [id number] Patient Account Number: [id number] Date of Birth/Sex: 12-05-1945 (75 y.o. M) Treating RN: Donnamarie Poag Primary Care Patirica Longshore: Fulton Reek Other Clinician: Referring Edoardo Laforte: Fulton Reek Treating Duvan Mousel/Extender: Skipper Cliche in Treatment: 2 Vital Signs Height(in): 88 Pulse(bpm): 51 Weight(lbs): 69 Blood Pressure(mmHg): 129/72 Body Mass Index(BMI): 39 Temperature(F): 98.3 Respiratory Rate(breaths/min): 18 Photos: [N/A:N/A] Wound Location: Left Calcaneus N/A N/A Wounding Event: Gradually Appeared N/A N/A Primary Etiology: Diabetic Wound/Ulcer of the Lower N/A N/A Extremity Comorbid History: Cataracts, Arrhythmia, Coronary N/A  N/A Artery Disease, Hypertension, Type II Diabetes, Osteoarthritis, Neuropathy Date Acquired: 07/07/2021 N/A N/A Weeks of Treatment: 2 N/A N/A Wound Status: Open N/A N/A Measurements L x W x D (cm) 1.5x0.9x0.1 N/A N/A Area (cm) : 1.06 N/A N/A Volume (cm) : 0.106 N/A N/A % Reduction in Area: 51.80% N/A N/A % Reduction in Volume: 51.80% N/A N/A Classification: Grade 2 N/A N/A Exudate Amount: Medium N/A N/A Exudate Type: Serosanguineous N/A N/A Exudate Color: red, [name] N/A N/A Wound Margin: Thickened N/A N/A Granulation Amount: Medium (34-66%) N/A N/A Granulation Quality: Red, Pink  N/A N/A Necrotic Amount: Medium (34-66%) N/A N/A Exposed Structures: Fat Layer (Subcutaneous Tissue): N/A N/A Yes Fascia: No Tendon: No Muscle: No Joint: No Bone: No Treatment Notes Electronic Signature(s) Signed: 08/11/2021 2:30:33 PM By: Donnamarie Poag Entered By: Donnamarie Poag on 08/11/2021 13:34:17 [name] ([id number]) -------------------------------------------------------------------------------- [place] Details Patient Name: [name], [name]. Date of Service: 08/11/2021 1:00 PM Medical Record Number: [id number] Patient Account Number: [id number] Date of Birth/Sex: 04-04-1946 (75 y.o. M) Treating RN: Donnamarie Poag Primary Care Treson [name]: Fulton Reek Other Clinician: Referring Arnold Depinto: Fulton Reek Treating Saskia Simerson/Extender: Skipper Cliche in Treatment: 2 Active Inactive Wound/Skin Impairment Nursing Diagnoses: Impaired tissue integrity Knowledge deficit related to smoking impact on wound healing Knowledge deficit related to ulceration/compromised skin integrity Goals: Patient/caregiver will verbalize understanding of skin care regimen Date Initiated: 07/28/2021 Date Inactivated: 08/11/2021 Target Resolution Date: 08/08/2021 Goal Status: Met Ulcer/skin breakdown will have a volume reduction of 30% by week 4 Date Initiated: 07/28/2021 Target  Resolution Date: 08/28/2021 Goal Status: Active Ulcer/skin breakdown will have a volume reduction of 50% by week 8 Date Initiated: 07/28/2021 Target Resolution Date: 09/27/2021 Goal Status: Active Ulcer/skin breakdown will have a volume reduction of 80% by week 12 Date Initiated: 07/28/2021 Target Resolution Date: 10/28/2020 Goal Status: Active Ulcer/skin breakdown will heal within 14 weeks Date Initiated: 07/28/2021 Target Resolution Date: 11/12/2020 Goal Status: Active Interventions: Assess patient/caregiver ability to obtain necessary supplies Assess patient/caregiver ability to perform ulcer/skin care regimen upon admission and as needed Assess ulceration(s) every visit Notes: Electronic Signature(s) Signed: 08/11/2021 2:30:33 PM By: Donnamarie Poag Entered By: Donnamarie Poag on 08/11/2021 13:14:37 [name] ([id number]) -------------------------------------------------------------------------------- Pain Assessment Details Patient Name: [name]. Date of Service: 08/11/2021 1:00 PM Medical Record Number: [id number] Patient Account Number: [id number] Date of Birth/Sex: 29-[name]-1946 (75 y.o. M) Treating RN: Donnamarie Poag Primary Care Eiliyah Reh: Fulton Reek Other Clinician: Referring Alisha Bacus: Fulton Reek Treating Ambree Frances/Extender: Skipper Cliche in Treatment: 2 Active Problems Location of Pain Severity and Description of Pain Patient Has Paino No Site Locations Rate the pain. Current Pain Level: 0 Pain Management and Medication Current Pain Management: Electronic Signature(s) Signed: 08/11/2021 2:30:33 PM By: Donnamarie Poag Entered By: Donnamarie Poag on 08/11/2021 13:08:51 [name] ([id number]) -------------------------------------------------------------------------------- Patient/Caregiver Education Details Patient Name: [name] Date of Service: 08/11/2021 1:00 PM Medical Record Number: [id number] Patient Account Number:  [id number] Date of Birth/Gender: 11-04-1945 (75 y.o. M) Treating RN: Donnamarie Poag Primary Care Physician: Fulton Reek Other Clinician: Referring Physician: Fulton Reek Treating Physician/Extender: Skipper Cliche in Treatment: 2 Education Assessment Education Provided To: Patient Education Topics Provided Basic Hygiene: Nutrition: Offloading: Wound/Skin Impairment: Electronic Signature(s) Signed: 08/11/2021 2:30:33 PM By: Donnamarie Poag Entered By: Donnamarie Poag on 08/11/2021 13:35:18 [name] ([id number]) -------------------------------------------------------------------------------- Wound Assessment Details Patient Name: [name], [name]. Date of Service: 08/11/2021 1:00 PM Medical Record Number: [id number] Patient Account Number: [id number] Date of Birth/Sex: 1946-02-20 (75 y.o. M) Treating RN: Donnamarie Poag Primary Care Shatima Zalar: Fulton Reek Other Clinician: Referring Lunabella Badgett: Fulton Reek Treating Kekoa Fyock/Extender: Skipper Cliche in Treatment: 2 Wound Status Wound Number: 9 Primary Diabetic Wound/Ulcer of the Lower Extremity Etiology: Wound Location: Left Calcaneus Wound Open Wounding Event: Gradually Appeared Status: Date Acquired: 07/07/2021 Comorbid Cataracts, Arrhythmia, Coronary Artery Disease, Weeks Of Treatment: 2 History: Hypertension, Type II Diabetes, Osteoarthritis, Clustered Wound: No Neuropathy Photos Wound Measurements Length: (cm) 1.5 Width: (cm) 0.9 Depth: (cm) 0.1 Area: (cm) 1.06 Volume: (cm) 0.106 % Reduction in Area: 51.8% % Reduction in Volume: 51.8%  Tunneling: No Undermining: No Wound Description Classification: Grade 2 Wound Margin: Thickened Exudate Amount: Medium Exudate Type: Serosanguineous Exudate Color: red, [name] Foul Odor After Cleansing: No Slough/Fibrino Yes Wound Bed Granulation Amount: Medium (34-66%) Exposed Structure Granulation Quality: Red, Pink Fascia Exposed: No Necrotic Amount: Medium  (34-66%) Fat Layer (Subcutaneous Tissue) Exposed: Yes Necrotic Quality: Adherent Slough Tendon Exposed: No Muscle Exposed: No Joint Exposed: No Bone Exposed: No Treatment Notes Wound #9 (Calcaneus) Wound Laterality: Left Cleanser Normal Saline Discharge Instruction: Wash your hands with soap and water. Remove old dressing, discard into plastic bag and place into trash. Cleanse the wound with Normal Saline prior to applying a clean dressing using gauze sponges, not tissues or cotton balls. Do not EAIN, MULLENDORE ([id number]) scrub or use excessive force. Pat dry using gauze sponges, not tissue or cotton balls. Peri-Wound Care Topical Primary Dressing Silvercel Small 2x2 (in/in) Discharge Instruction: Apply Silvercel Small 2x2 (in/in) as instructed Secondary Dressing Gauze heel cup Discharge Instruction: apply over dressing to protect heel Secured With 67M Medipore H Soft Cloth Surgical Tape, 2x2 (in/yd) Coban Cohesive Bandage 4x5 (yds) Stretched Discharge Instruction: Apply Coban as directed. Kerlix Roll Sterile or Non-Sterile 6-ply 4.5x4 (yd/yd) Discharge Instruction: Apply Kerlix as directed Compression Wrap Compression Stockings Add-Ons Electronic Signature(s) Signed: 08/11/2021 2:30:33 PM By: Donnamarie Poag Entered By: Donnamarie Poag on 08/11/2021 13:12:21 [name] ([id number]) -------------------------------------------------------------------------------- [place] Details Patient Name: [name]. Date of Service: 08/11/2021 1:00 PM Medical Record Number: [id number] Patient Account Number: [id number] Date of Birth/Sex: Nov 16, 1945 (75 y.o. M) Treating RN: Donnamarie Poag Primary Care Bhavin Monjaraz: Fulton Reek Other Clinician: Referring Logen Fowle: Fulton Reek Treating Samreet [place]field/Extender: Skipper Cliche in Treatment: 2 Vital Signs Time Taken: 13:07 Temperature (F): 98.3 Height (in): 69 Pulse (bpm): 68 Weight (lbs): 267 Respiratory Rate (breaths/min):  18 Body Mass Index (BMI): 39.4 Blood Pressure (mmHg): 129/72 Reference Range: 80 - 120 mg / dl Electronic Signature(s) Signed: 08/11/2021 2:30:33 PM By: Donnamarie Poag Entered ByDonnamarie Poag on 08/11/2021 13:08:15

## 2021-08-11 NOTE — Progress Notes (Addendum)
[name], [name] ([id number]) Visit Report for 08/11/2021 Chief Complaint Document Details Patient Name: [name], [name]. Date of Service: 08/11/2021 1:00 PM Medical Record Number: [id number] Patient Account Number: [id number] Date of Birth/Sex: 20-Oct-1945 (75 y.o. M) Treating RN: Donnamarie Poag Primary Care Provider: Fulton Reek Other Clinician: Referring Provider: Fulton Reek Treating Provider/Extender: Skipper Cliche in Treatment: 2 Information Obtained from: Patient Chief Complaint Right LE ulcers and cellulitis Electronic Signature(s) Signed: 08/11/2021 1:33:52 PM By: Worthy Keeler PA-C Entered By: Worthy Keeler on 08/11/2021 13:33:52 TAMIR, WALLMAN ([id number]) -------------------------------------------------------------------------------- HPI Details Patient Name: [name], [name]. Date of Service: 08/11/2021 1:00 PM Medical Record Number: [id number] Patient Account Number: [id number] Date of Birth/Sex: [birth year] (75 y.o. M) Treating RN: Donnamarie Poag Primary Care Provider: Fulton Reek Other Clinician: Referring Provider: Fulton Reek Treating Provider/Extender: Skipper Cliche in Treatment: 2 History of Present Illness HPI Description: 09/24/2020 on evaluation today patient presents today for a heel ulcer that he tells me has been present for about 2 years. He has been seeing podiatry and they have been attempting to manage this including what sounds to be a total contact cast, Unna boot, and just standard dressings otherwise as well. Most recently has been using triple antibiotic ointment. With that being said unfortunately despite everything he really has not had any significant improvement. He tells me that he cannot even really remember exactly how this began but he presumed it may have rubbed on his shoes or something of that nature. 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 He tells me that this does seem to help. Fortunately there is no signs of active infection at this time. His most recent hemoglobin A1c was 8.1 though he knows this was this year he cannot tell me the exact time. His fluid pills currently to help with some of the lower extremity edema although he does obviously have signs of venous stasis/lymphedema. Currently there is no evidence of active infection. No fevers, chills, nausea, vomiting, or diarrhea. Patient has had fairly recent ABIs which were performed on 07/19/2020 and revealed that he has normal findings in both the ankle and toe locations bilaterally. His ABI on the right was 1.09 on the left was 1.08 with a TBI on the right of 0.88 and on the left of 0.94. Triphasic flow was noted throughout. 10/08/2020 on evaluation today patient appears to be doing pretty well in regard to his left heel currently in fact this is doing a great job and seems to be healing quite nicely. Unfortunately on his right leg he had a pile of wood that actually fell on him injuring his right leg this is somewhat erythematous has me concerned little bit about cellulitis though there is not really a good area to culture at this point. 10/24/2020 upon evaluation today patient appears to be doing well with regard to his heel ulcer. He is showing signs of improvement which is great news. His right leg is completely healed. Overall I feel like he is doing excellent and there is no signs of infection. 11/07/2020 upon evaluation today patient appears to be doing well with regard to his heel ulcer. He tells me that last week when he was unable to  come in his wife actually thought that the wound was very close to closing if not closed. Then it began to "reopen again". I really  feel like what may have happened as the collagen may have dried over the wound bed and that because that misunderstanding with thinking that the wound was healing. With that being said I did not see it last week I do not know that for certain. Either way I feel like he is doing great today I see no signs of infection at this point. 11/26/2020 upon inspection today patient appears to be doing decently well in regard to his heel ulcer. He has been tolerating the dressing changes without complication. Fortunately there is no sign of active infection at this time. No fevers, chills, nausea, vomiting, or diarrhea. 12/10/2020 upon evaluation today patient appears to be doing fairly well in regard to the wound on his heel as well as what appears to be a new wound of the left first metatarsal head plantar aspect. This seems to be an area that was callus that has split as the patient tells me has been trying to walk on his toes more has probably where this came from. With that being said there does not appear to be signs of active infection which is great news. 12/17/2020 upon evaluation today patient actually appears to be making good progress currently. Fortunately there is no evidence of active infection at this time. Overall I feel like he is very close to complete closure. 12/26/2020 upon evaluation today patient appears to be doing excellent in regard to his wounds. In fact I am not certain that these are not even completely healed on initial inspection. Overall I am very pleased with where things stand today. Good news is that they are healed he is actually get ready to go out of town and that will be helpful as well as he will be a full part of the time. Readmission: 02/04/2021 upon evaluation today patient appears to be doing well at this point in regard to his left heel that I previously saw him for. Unfortunately he is having issues with his right lower extremity. He has significant wounds at this point  he also has erythema noted there is definite signs of cellulitis which is unfortunate. With that being said I think we do need to address this sooner rather than later. The good news is he did have a nice trip to the beach. He tells me that he had no issues during that time. 4/27; patient on Bactrim. Culture showed methicillin sensitive staph aureus therefore the Bactrim should be effective. 2 small areas on the right leg are healed the area on the mid aspect of the tibia almost 100% covered in a very adherent necrotic debris. We have been using silver alginate 02/20/2021 upon evaluation today patient appears to be doing well with regard to his wound. He is showing signs of improvement which is great news overall very pleased with where things stand today. No fevers, chills, nausea, vomiting, or diarrhea. 02/27/2021 upon evaluation today patient appears to be doing well with regard to his leg ulcer. He is tolerating the dressing changes and overall appears to be doing quite excellent. I am extremely pleased with where things stand and overall I think patient is making great progress. There is no sign of active infection at this time which is also great news. 03/06/2021 upon evaluation today patient appears to be doing excellent in regard to his wounds. In fact he appears  to be completely healed today based on what I am seeing. This is excellent news and overall I am extremely pleased with where he stands. Overall the patient is happy to hear this as well this has been quite sometime coming. Readmission: 04/01/2021 patient unfortunately returns for readmission today. He tells me that he has been wearing his compression socks on the right leg daily KEYLON, LABELLE ([id number]) and he does not really know what is going on and why his legs are doing what they are doing. We did therefore go ahead and probe deeper into exactly what has been going on with him today. Subsequently the patient tells me that when  he gets up and what we would call "first thing in the morning" are really 2 different things". He does not tend to sleep well so he tells me that he will often wake up at 3:00 in the morning. He will then potentially going into the living room to get in his chair where he may read a book for a little while and then potentially fall asleep back in his chair. He then subsequently wake up around 5:00 or so and then get up and in his words "putter around". This often will end with him proceeding at some point around 8 AM or 9 AM to putting on his compression socks. With that being said this means that anywhere from the 3:00 in the morning till roughly around 9:00 in the morning he has no compression on yet he is sitting in his recliner, walking around and up and about, and this is at least 5 to 6 hours of noncompressed time. That may be our issue here but I did not realize until we discussed this further today. Fortunately there does not appear to be any signs of active infection at this time which is great news. With that being said he has multiple wounds of the bilateral lower extremities. 04/10/2021 upon evaluation today patient appears to be doing well with regard to his legs for the most part. He does look like he had some injury where his wrap slid down nonetheless he did some "doctoring on them". I do feel like most of the areas honestly have cleared back up which is great news. There does not appear to be any signs of active infection at this time which is also great news. No fevers, chills, nausea, vomiting, or diarrhea. 04/18/2021 upon evaluation today patient appears to be doing well with regard to his wounds in fact he appears to be completely healed which is great news. Fortunately there does not appear to be any signs of active infection which is great news. No fevers, chills, nausea, vomiting, or diarrhea. READMISSION 07/28/2021 This is a now 75 year old man we have had in this clinic for  quite a bit of this year. 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 He wears compression stockings. 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 He comes in an old running shoes although he says he wears better shoes at home. His history is that he noticed a blister in the right posterior heel. This open. He has been applying Neosporin to it currently the wound measures 2 x 1.4 cm. 100% slough covered. Not really offloading this in any rigorous way. Past medical history is essentially unchanged he has paroxysmal atrial fibrillation on Coumadin type 2 diabetes with a recent hemoglobin A1c of 7 on metformin. ABI  in our clinic was 0.98 on the right 08/05/2021 upon evaluation today patient appears to be doing well with regard to his heel ulcer. Fortunately there does not appear to be any signs of active infection at this time. Overall I been very pleased with where things seem to be currently as far as the wound healing is concerned. Again this is first a lot of seeing him Dr. Dellia Nims readmitted him last week. Nonetheless I think we are definitely making some progress here. 08/11/2021 upon evaluation today patient's wound on the heel actually showing signs of excellent improvement. I am actually very pleased with where things stand currently. No fevers, chills, nausea, vomiting, or diarrhea. There does not appear to be any need for sharp debridement today either which is also great news. Electronic Signature(s) Signed: 08/11/2021 2:05:59 PM By: Worthy Keeler PA-C Entered By: Worthy Keeler on 08/11/2021 14:05:59 [name] ([id number]) -------------------------------------------------------------------------------- Physical Exam Details Patient Name: DUSHAUN, OKEY Date of Service: 08/11/2021 1:00 PM Medical Record Number: [id number] Patient Account Number: [id number] Date of  Birth/Sex: 12-16-1945 (75 y.o. M) Treating RN: Donnamarie Poag Primary Care Provider: Fulton Reek Other Clinician: Referring Provider: Fulton Reek Treating Provider/Extender: Skipper Cliche in Treatment: 2 Constitutional Well-nourished and well-hydrated in no acute distress. Respiratory normal breathing without difficulty. Psychiatric this patient is able to make decisions and demonstrates good insight into disease process. Alert and Oriented x 3. pleasant and cooperative. Notes Upon inspection patient's wound bed showed signs of good granulation and epithelization at this point. Fortunately I think that he is making all some progress and I do not see any signs of anything worsening in fact I think he is making leaps and strides in the appropriate direction. My hope is that this will continue to heal appropriately as we progress forward. Electronic Signature(s) Signed: 08/11/2021 2:06:28 PM By: Worthy Keeler PA-C Entered By: Worthy Keeler on 08/11/2021 14:06:28 [name] ([id number]) -------------------------------------------------------------------------------- Physician Orders Details Patient Name: [name], [name]. Date of Service: 08/11/2021 1:00 PM Medical Record Number: [id number] Patient Account Number: [id number] Date of Birth/Sex: 10/10/1946 (75 y.o. 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 o Nurse Visit as needed - call to schedule Bathing/ Shower/ Hygiene o May shower with wound  dressing protected with water repellent cover or cast protector. o No tub bath. Edema Control - Lymphedema / Segmental Compressive Device / Other o Patient to wear own compression stockings. Remove compression stockings every night before going to bed and put on every morning when getting up. - right leg o Elevate leg(s) parallel to the floor when sitting. o DO YOUR BEST to sleep in the bed at night. DO NOT sleep in your recliner. Long hours of sitting in a recliner leads to swelling of the legs and/or potential wounds on your backside. Off-Loading o Other: - L heel offloader shoe Keep pressure off of heel Medications-Please add to medication list. o Other: - check blood sugar and eat protein for wound healing Wound Treatment Wound #9 - Calcaneus Wound Laterality: Left Cleanser: Normal Saline Every Other Day/15 Days Discharge Instructions: Wash your hands  with soap and water. Remove old dressing, discard into plastic bag and place into trash. Cleanse the wound with Normal Saline prior to applying a clean dressing using gauze sponges, not tissues or cotton balls. Do not scrub or use excessive force. Pat dry using gauze sponges, not tissue or cotton balls. Primary Dressing: Silvercel Small 2x2 (in/in) (Generic) Every Other Day/15 Days Discharge Instructions: Apply Silvercel Small 2x2 (in/in) as instructed Secondary Dressing: Gauze (Generic) Every Other Day/15 Days Secondary Dressing: heel cup Every Other Day/15 Days Discharge Instructions: apply over dressing to protect heel Secured With: 7M Medipore H Soft Cloth Surgical Tape, 2x2 (in/yd) (Generic) Every Other Day/15 Days Secured With: Coban Cohesive Bandage 4x5 (yds) Stretched Every Other Day/15 Days Discharge Instructions: Apply Coban as directed. Secured With: [employer] or Non-Sterile 6-ply 4.5x4 (yd/yd) (Generic) Every Other Day/15 Days Discharge Instructions: Apply Kerlix as directed Electronic  Signature(s) ANDRANIK, JEUNE ([id number]) Signed: 08/11/2021 2:30:33 PM By: Donnamarie Poag Signed: 08/11/2021 4:31:19 PM By: Worthy Keeler PA-C Entered By: Donnamarie Poag on 08/11/2021 13:36:34 [name] ([id number]) -------------------------------------------------------------------------------- Problem List Details Patient Name: TIBURCIO, LINDER. Date of Service: 08/11/2021 1:00 PM Medical Record Number: [id number] Patient Account Number: [id number] Date of Birth/Sex: 1946/10/10 (75 y.o. 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 Date of Service: 08/11/2021 1:00 PM Medical Record Number: [id number] Patient Account Number: [id number] Date of Birth/Sex: [birth year] (75 y.o. M) Treating RN: Donnamarie Poag Primary Care Provider: Fulton Reek Other Clinician: Referring Provider: Fulton Reek Treating Provider/Extender: Skipper Cliche in Treatment: 2 Subjective Chief Complaint Information obtained from Patient Right LE ulcers and cellulitis History of Present Illness (HPI) 09/24/2020 on evaluation today patient presents today for a heel ulcer that he tells me has been present for  about 2 years. He has been seeing podiatry and they have been attempting to manage this including what sounds to be a total contact cast, Unna boot, and just standard dressings otherwise as well. Most recently has been using triple antibiotic ointment. With that being said unfortunately despite everything he really has not had any significant improvement. He tells me that he cannot even really remember exactly how this began but he presumed it may have rubbed on his shoes or something of that nature. 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 He tells me that this does seem to help. Fortunately there is no signs of active infection at this time. His most recent hemoglobin A1c was 8.1 though he knows this was this year he cannot tell me the exact time. His fluid pills currently to help with some of the lower extremity edema although  he does obviously have signs of venous stasis/lymphedema. Currently there is no evidence of active infection. No fevers, chills, nausea, vomiting, or diarrhea. Patient has had fairly recent ABIs which were performed on 07/19/2020 and revealed that he has normal findings in both the ankle and toe locations bilaterally. His ABI on the right was 1.09 on the left was 1.08 with a TBI on the right of 0.88 and on the left of 0.94. Triphasic flow was noted throughout. 10/08/2020 on evaluation today patient appears to be doing pretty well in regard to his left heel currently in fact this is doing a great job and seems to be healing quite nicely. Unfortunately on his right leg he had a pile of wood that actually fell on him injuring his right leg this is somewhat erythematous has me concerned little bit  about cellulitis though there is not really a good area to culture at this point. 10/24/2020 upon evaluation today patient appears to be doing well with regard to his heel ulcer. He is showing signs of improvement which is great news. His right leg is completely healed. Overall I feel like he is doing excellent and there is no signs of infection. 11/07/2020 upon evaluation today patient appears to be doing well with regard to his heel ulcer. He tells me that last week when he was unable to come in his wife actually thought that the wound was very close to closing if not closed. Then it began to "reopen again". I really feel like what may have happened as the collagen may have dried over the wound bed and that because that misunderstanding with thinking that the wound was healing. With that being said I did not see it last week I do not know that for certain. Either way I feel like he is doing great today I see no signs of infection at this point. 11/26/2020 upon inspection today patient appears to be doing decently well in regard to his heel ulcer. He has been tolerating the dressing changes without complication. Fortunately there is no sign of active infection at this time. No fevers, chills, nausea, vomiting, or diarrhea. 12/10/2020 upon evaluation today patient appears to be doing fairly well in regard to the wound on his heel as well as what appears to be a new wound of the left first metatarsal head plantar aspect. This seems to be an area that was callus that has split as the patient tells me has been trying to walk on his toes more has probably where this came from. With that being said there does not appear to be signs of active infection which is great news. 12/17/2020 upon evaluation today patient actually appears to be making good progress currently. Fortunately there is no evidence of active infection at this time. Overall I feel like he is very close to complete closure. 12/26/2020 upon  evaluation today patient appears to be doing excellent in regard to his wounds. In fact I am not certain that these are not even completely healed on initial inspection. Overall I am very pleased with where things stand today. Good news is that they are healed he is actually get ready to go out of town and that will be helpful as well as he will be a full part of the time. Readmission: 02/04/2021 upon evaluation today patient appears to be doing well at this point in regard to his left heel that I previously saw him for. Unfortunately he is having issues with his right lower extremity.  He has significant wounds at this point he also has erythema noted there is definite signs of cellulitis which is unfortunate. With that being said I think we do need to address this sooner rather than later. The good news is he did have a nice trip to the beach. He tells me that he had no issues during that time. 4/27; patient on Bactrim. Culture showed methicillin sensitive staph aureus therefore the Bactrim should be effective. 2 small areas on the right leg are healed the area on the mid aspect of the tibia almost 100% covered in a very adherent necrotic debris. We have been using silver alginate 02/20/2021 upon evaluation today patient appears to be doing well with regard to his wound. He is showing signs of improvement which is great news overall very pleased with where things stand today. No fevers, chills, nausea, vomiting, or diarrhea. 02/27/2021 upon evaluation today patient appears to be doing well with regard to his leg ulcer. He is tolerating the dressing changes and overall appears to be doing quite excellent. I am extremely pleased with where things stand and overall I think patient is making great progress. There is no sign of active infection at this time which is also great news. 03/06/2021 upon evaluation today patient appears to be doing excellent in regard to his wounds. In fact he appears to be  completely healed today based on what I am seeing. This is excellent news and overall I am extremely pleased with where he stands. Overall the patient is happy to hear QUEST, [name] ([id number]) this as well this has been quite sometime coming. Readmission: 04/01/2021 patient unfortunately returns for readmission today. He tells me that he has been wearing his compression socks on the right leg daily and he does not really know what is going on and why his legs are doing what they are doing. We did therefore go ahead and probe deeper into exactly what has been going on with him today. Subsequently the patient tells me that when he gets up and what we would call "first thing in the morning" are really 2 different things". He does not tend to sleep well so he tells me that he will often wake up at 3:00 in the morning. He will then potentially going into the living room to get in his chair where he may read a book for a little while and then potentially fall asleep back in his chair. He then subsequently wake up around 5:00 or so and then get up and in his words "putter around". This often will end with him proceeding at some point around 8 AM or 9 AM to putting on his compression socks. With that being said this means that anywhere from the 3:00 in the morning till roughly around 9:00 in the morning he has no compression on yet he is sitting in his recliner, walking around and up and about, and this is at least 5 to 6 hours of noncompressed time. That may be our issue here but I did not realize until we discussed this further today. Fortunately there does not appear to be any signs of active infection at this time which is great news. With that being said he has multiple wounds of the bilateral lower extremities. 04/10/2021 upon evaluation today patient appears to be doing well with regard to his legs for the most part. He does look like he had some injury where his wrap slid down nonetheless he did  some "doctoring on them".  I do feel like most of the areas honestly have cleared back up which is great news. There does not appear to be any signs of active infection at this time which is also great news. No fevers, chills, nausea, vomiting, or diarrhea. 04/18/2021 upon evaluation today patient appears to be doing well with regard to his wounds in fact he appears to be completely healed which is great news. Fortunately there does not appear to be any signs of active infection which is great news. No fevers, chills, nausea, vomiting, or diarrhea. READMISSION 07/28/2021 This is a now 75 year old man we have had in this clinic for quite a bit of this year. 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 He wears compression stockings. 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 He comes in an old running shoes although he says he wears better shoes at home. His history is that he noticed a blister in the right posterior heel. This open. He has been applying Neosporin to it currently the wound measures 2 x 1.4 cm. 100% slough covered. Not really offloading this in any rigorous way. Past medical history is essentially unchanged he has paroxysmal atrial fibrillation on Coumadin type 2 diabetes with a recent hemoglobin A1c of 7 on metformin. ABI in our clinic was 0.98 on the right 08/05/2021 upon evaluation today patient appears to be doing well with regard to his heel ulcer. Fortunately there does not appear to be any signs of active infection at this time. Overall I been very pleased with where things seem to be currently as far as the wound healing is concerned. Again this is first a lot of seeing him Dr. Dellia Nims readmitted him last week. Nonetheless I think we are definitely making some progress here. 08/11/2021 upon evaluation today patient's wound on the heel actually showing signs of excellent  improvement. I am actually very pleased with where things stand currently. No fevers, chills, nausea, vomiting, or diarrhea. There does not appear to be any need for sharp debridement today either which is also great news. Objective Constitutional Well-nourished and well-hydrated in no acute distress. Vitals Time Taken: 1:07 PM, Height: 69 in, Weight: 267 lbs, BMI: 39.4, Temperature: 98.3 F, Pulse: 68 bpm, Respiratory Rate: 18 breaths/min, Blood Pressure: 129/72 mmHg. Respiratory normal breathing without difficulty. Psychiatric this patient is able to make decisions and demonstrates good insight into disease process. Alert and Oriented x 3. pleasant and cooperative. General Notes: Upon inspection patient's wound bed showed signs of good granulation and epithelization at this point. Fortunately I think that he is making all some progress and I do not see any signs of anything worsening in fact I think he is making leaps and strides in the appropriate direction. My hope is that this will continue to heal appropriately as we progress forward. Integumentary (Hair, Skin) Wound #9 status is Open. Original cause of wound was Gradually Appeared. The date acquired was: 07/07/2021. The wound has been in treatment DANTA, BAUMGARDNER. ([id number]) 2 weeks. The wound is located on the Left Calcaneus. The wound measures 1.5cm length x 0.9cm width x 0.1cm depth; 1.06cm^2 area and 0.106cm^3 volume. There is Fat Layer (Subcutaneous Tissue) exposed. There is no tunneling or undermining noted. There is a medium amount of serosanguineous drainage noted. The wound margin is thickened. There is medium (34-66%) red, pink granulation within the wound bed. There is a medium (34-66%) amount of necrotic tissue within  the wound bed including Adherent Slough. 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 Nurse Visit as needed - call to schedule Bathing/ Shower/ Hygiene: May shower with wound dressing protected with water repellent cover or cast protector. No tub bath. Edema Control - Lymphedema / Segmental Compressive Device / Other: Patient to wear own compression stockings. Remove compression stockings every night before going to bed and put on every morning when getting up. - right leg Elevate leg(s) parallel to the floor when sitting. DO YOUR BEST to sleep in the bed at night. DO NOT sleep in your recliner. Long hours of sitting in a recliner leads to swelling of the legs and/or potential wounds on your backside. Off-Loading: Other: - L heel offloader shoe Keep pressure off of heel Medications-Please add to medication list.: Other: - check blood sugar and eat protein for wound healing WOUND #9: - Calcaneus Wound Laterality: Left Cleanser: Normal Saline Every Other Day/15 Days Discharge Instructions: Wash your hands with soap and water. Remove old dressing, discard into plastic bag and place into trash. Cleanse the wound with Normal Saline prior to applying a clean dressing using gauze sponges, not tissues or cotton balls. Do not scrub or use excessive force. Pat dry using gauze sponges, not tissue or cotton balls. Primary Dressing: Silvercel Small 2x2 (in/in) (Generic) Every Other Day/15 Days Discharge Instructions: Apply Silvercel Small 2x2 (in/in) as instructed Secondary Dressing: Gauze (Generic) Every Other Day/15 Days Secondary Dressing: heel cup Every Other Day/15 Days Discharge Instructions: apply over dressing to protect heel Secured With: 42M Medipore H Soft Cloth Surgical Tape, 2x2 (in/yd) (Generic) Every Other Day/15 Days Secured With: Coban Cohesive Bandage 4x5 (yds) Stretched Every Other Day/15 Days Discharge Instructions: Apply Coban as directed. Secured With:  [employer] or Non-Sterile 6-ply 4.5x4 (yd/yd) (Generic) Every Other Day/15 Days Discharge Instructions: Apply Kerlix as directed 1. Would recommend currently that we going continue with the wound care measures as before. We have been using the silver alginate dressing which I think is doing a good job, to continue as such with that. 2. Also can recommend that we have the patient continue to monitor for any signs of infection such as increased pain or otherwise if anything occurs in this way he should let me know soon as possible. 3. I am also can recommend he continue with a heel cup all as well as the offloading shoe which I think both are doing a great job for him. We will see patient back for reevaluation in 1 week here in the clinic. If anything worsens or changes patient will contact our office for additional recommendations. Electronic Signature(s) Signed: 08/11/2021 2:07:20 PM By: Worthy Keeler PA-C Entered By: Worthy Keeler on 08/11/2021 14:07:20 JAIRON, RIPBERGER ([id number]) -------------------------------------------------------------------------------- SuperBill Details Patient Name: LANNIS, [name]. Date of Service: 08/11/2021 Medical Record Number: [id number] Patient Account Number: [id number] Date of Birth/Sex: 01/27/1946 (75 y.o. M) Treating RN: Donnamarie Poag Primary Care Provider: Fulton Reek Other Clinician: Referring Provider: Fulton Reek Treating Provider/Extender: Skipper Cliche in Treatment: 2 Diagnosis Coding ICD-10 Codes Code Description [phone] Type 2 diabetes mellitus with foot ulcer L97.421 Non-pressure chronic ulcer of left heel and midfoot limited to breakdown of skin E11.42 Type 2 diabetes mellitus with diabetic polyneuropathy Facility Procedures CPT4 Code: 53614431 Description: [phone] - WOUND  CARE VISIT-LEV 2 EST PT Modifier: Quantity: 1 Physician Procedures CPT4 Code: 3612244 Description: 97530 - WC PHYS LEVEL 3 - EST  PT Modifier: Quantity: 1 CPT4 Code: Description: ICD-10 Diagnosis Description E11.621 Type 2 diabetes mellitus with foot ulcer L97.421 Non-pressure chronic ulcer of left heel and midfoot limited to breakdo E11.42 Type 2 diabetes mellitus with diabetic polyneuropathy Modifier: wn of skin Quantity: Electronic Signature(s) Signed: 08/11/2021 2:08:21 PM By: Worthy Keeler PA-C Entered By: Worthy Keeler on 08/11/2021 14:08:21

## 2021-08-14 ENCOUNTER — Other Ambulatory Visit: Payer: Self-pay

## 2021-08-14 DIAGNOSIS — L97421 Non-pressure chronic ulcer of left heel and midfoot limited to breakdown of skin: Secondary | ICD-10-CM | POA: Diagnosis not present

## 2021-08-14 NOTE — Progress Notes (Signed)
[name], [name] ([id number]) Visit Report for 08/14/2021 Arrival Information Details Patient Name: [name], [name]. Date of Service: 08/14/2021 8:00 AM Medical Record Number: [id number] Patient Account Number: [id number] Date of Birth/Sex: 06/09/1946 (75 y.o. M) Treating RN: Donnamarie Poag Primary Care Kayon Dozier: Fulton Reek Other Clinician: Referring Travius Crochet: Fulton Reek Treating Yana Schorr/Extender: Skipper Cliche in Treatment: 2 Visit Information History Since Last Visit Added or deleted any medications: No Patient Arrived: [name] Had a fall or experienced change in No Arrival Time: 08:05 activities of daily living that may affect Accompanied By: self risk of falls: Transfer Assistance: None Hospitalized since last visit: No Patient Identification Verified: Yes Has Dressing in Place as Prescribed: Yes Secondary Verification Process Completed: Yes Pain Present Now: No Patient Has Alerts: Yes Patient Alerts: Patient on Blood Thinner Coumadin Diabetic aspirin 81mg  Electronic Signature(s) Signed: 08/14/2021 3:14:11 PM By: Donnamarie Poag Entered By: Donnamarie Poag on 08/14/2021 08:07:24 [name] ([id number]) -------------------------------------------------------------------------------- Clinic Level of Care Assessment Details Patient Name: [name]. Date of Service: 08/14/2021 8:00 AM Medical Record Number: [id number] Patient Account Number: [id number] Date of Birth/Sex: 03/21/1946 (75 y.o. M) Treating RN: Donnamarie Poag Primary Care Ceriah Kohler: Fulton Reek Other Clinician: Referring Lavi Sheehan: Fulton Reek Treating Darrion Wyszynski/Extender: Skipper Cliche in Treatment: 2 Clinic Level of Care Assessment Items TOOL 4 Quantity Score []  - Use when only an EandM is performed on FOLLOW-UP visit 0 ASSESSMENTS - Nursing Assessment / Reassessment []  - Reassessment of Co-morbidities (includes updates in patient status) 0 []  - 0 Reassessment of Adherence to  Treatment Plan ASSESSMENTS - Wound and Skin Assessment / Reassessment X - Simple Wound Assessment / Reassessment - one wound 1 5 []  - 0 Complex Wound Assessment / Reassessment - multiple wounds []  - 0 Dermatologic / Skin Assessment (not related to wound area) ASSESSMENTS - Focused Assessment []  - Circumferential Edema Measurements - multi extremities 0 []  - 0 Nutritional Assessment / Counseling / Intervention []  - 0 Lower Extremity Assessment (monofilament, tuning fork, pulses) []  - 0 Peripheral Arterial Disease Assessment (using hand held doppler) ASSESSMENTS - Ostomy and/or Continence Assessment and Care []  - Incontinence Assessment and Management 0 []  - 0 Ostomy Care Assessment and Management (repouching, etc.) PROCESS - Coordination of Care X - Simple Patient / Family Education for ongoing care 1 15 []  - 0 Complex (extensive) Patient / Family Education for ongoing care []  - 0 Staff obtains Programmer, systems, Records, Test Results / Process Orders []  - 0 Staff telephones HHA, Nursing Homes / Clarify orders / etc []  - 0 Routine Transfer to another Facility (non-emergent condition) []  - 0 Routine Hospital Admission (non-emergent condition) []  - 0 New Admissions / Biomedical engineer / Ordering NPWT, Apligraf, etc. []  - 0 Emergency Hospital Admission (emergent condition) X- 1 10 Simple Discharge Coordination []  - 0 Complex (extensive) Discharge Coordination PROCESS - Special Needs []  - Pediatric / Minor Patient Management 0 []  - 0 Isolation Patient Management []  - 0 Hearing / Language / Visual special needs []  - 0 Assessment of Community assistance (transportation, D/C planning, etc.) []  - 0 Additional assistance / Altered mentation []  - 0 Support Surface(s) Assessment (bed, cushion, seat, etc.) INTERVENTIONS - Wound Cleansing / Measurement [name], [name] ([id number]) X- 1 5 Simple Wound Cleansing - one wound []  - 0 Complex Wound Cleansing - multiple wounds []   - 0 Wound Imaging (photographs - any number of wounds) []  - 0 Wound Tracing (instead of photographs) X- 1 5 Simple Wound Measurement - one wound []  -  0 Complex Wound Measurement - multiple wounds INTERVENTIONS - Wound Dressings []  - Small Wound Dressing one or multiple wounds 0 []  - 0 Medium Wound Dressing one or multiple wounds []  - 0 Large Wound Dressing one or multiple wounds []  - 0 Application of Medications - topical []  - 0 Application of Medications - injection INTERVENTIONS - Miscellaneous []  - External ear exam 0 []  - 0 Specimen Collection (cultures, biopsies, blood, body fluids, etc.) []  - 0 Specimen(s) / Culture(s) sent or taken to Lab for analysis []  - 0 Patient Transfer (multiple staff / Civil Service fast streamer / Similar devices) []  - 0 Simple Staple / Suture removal (25 or less) []  - 0 Complex Staple / Suture removal (26 or more) []  - 0 Hypo / Hyperglycemic Management (close monitor of Blood Glucose) []  - 0 Ankle / Brachial Index (ABI) - do not check if billed separately []  - 0 Vital Signs Has the patient been seen at the hospital within the last three years: Yes Total Score: 40 Level Of Care: New/Established - Level 2 Electronic Signature(s) Signed: 08/14/2021 3:14:11 PM By: Donnamarie Poag Entered By: Donnamarie Poag on 08/14/2021 08:28:34 [name] ([id number]) -------------------------------------------------------------------------------- Encounter Discharge Information Details Patient Name: [name], [name]. Date of Service: 08/14/2021 8:00 AM Medical Record Number: [id number] Patient Account Number: [id number] Date of Birth/Sex: July 29, 1946 (75 y.o. M) Treating RN: Donnamarie Poag Primary Care Deanda Ruddell: Fulton Reek Other Clinician: Referring Philisha Weinel: Fulton Reek Treating Devone Bonilla/Extender: Skipper Cliche in Treatment: 2 Encounter Discharge Information Items Discharge Condition: Stable Ambulatory Status: Cane Discharge Destination:  Home Transportation: Private Auto Accompanied By: self Schedule Follow-up Appointment: Yes Clinical Summary of Care: Electronic Signature(s) Signed: 08/14/2021 3:14:11 PM By: Donnamarie Poag Entered By: Donnamarie Poag on 08/14/2021 08:23:07 [name] ([id number]) -------------------------------------------------------------------------------- Wound Assessment Details Patient Name: [name], [name]. Date of Service: 08/14/2021 8:00 AM Medical Record Number: [id number] Patient Account Number: [id number] Date of Birth/Sex: [birth year] (75 y.o. M) Treating RN: Donnamarie Poag Primary Care Shavaughn Seidl: Fulton Reek Other Clinician: Referring Jilliam Bellmore: Fulton Reek Treating Loistine Eberlin/Extender: Skipper Cliche in Treatment: 2 Wound Status Wound Number: 9 Primary Diabetic Wound/Ulcer of the Lower Extremity Etiology: Wound Location: Left Calcaneus Wound Open Wounding Event: Gradually Appeared Status: Date Acquired: 07/07/2021 Comorbid Cataracts, Arrhythmia, Coronary Artery Disease, Weeks Of Treatment: 2 History: Hypertension, Type II Diabetes, Osteoarthritis, Clustered Wound: No Neuropathy Wound Measurements Length: (cm) 1.5 Width: (cm) 0.9 Depth: (cm) 0.1 Area: (cm) 1.06 Volume: (cm) 0.106 % Reduction in Area: 51.8% % Reduction in Volume: 51.8% Wound Description Classification: Grade 2 Wound Margin: Thickened Exudate Amount: Medium Exudate Type: Serosanguineous Exudate Color: red, brown Foul Odor After Cleansing: No Slough/Fibrino Yes Wound Bed Granulation Amount: Medium (34-66%) Exposed Structure Granulation Quality: Red, Pink Fascia Exposed: No Necrotic Amount: Medium (34-66%) Fat Layer (Subcutaneous Tissue) Exposed: Yes Necrotic Quality: Adherent Slough Tendon Exposed: No Muscle Exposed: No Joint Exposed: No Bone Exposed: No Treatment Notes Wound #9 (Calcaneus) Wound Laterality: Left Cleanser Normal Saline Discharge Instruction: Wash your hands with soap and  water. Remove old dressing, discard into plastic bag and place into trash. Cleanse the wound with Normal Saline prior to applying a clean dressing using gauze sponges, not tissues or cotton balls. Do not scrub or use excessive force. Pat dry using gauze sponges, not tissue or cotton balls. Peri-Wound Care Topical Primary Dressing Silvercel Small 2x2 (in/in) Discharge Instruction: Apply Silvercel Small 2x2 (in/in) as instructed Secondary Dressing Gauze heel cup Discharge Instruction: apply over dressing to protect heel Secured With Precious Bard  JMarland Kitchen ([id number]) 29M Medipore H Soft Cloth Surgical Tape, 2x2 (in/yd) Coban Cohesive Bandage 4x5 (yds) Stretched Discharge Instruction: Apply Coban as directed. Kerlix Roll Sterile or Non-Sterile 6-ply 4.5x4 (yd/yd) Discharge Instruction: Apply Kerlix as directed Compression Wrap Compression Stockings Add-Ons Electronic Signature(s) Signed: 08/14/2021 3:14:11 PM By: Donnamarie Poag Entered ByDonnamarie Poag on 08/14/2021 08:07:49

## 2021-08-17 ENCOUNTER — Other Ambulatory Visit: Payer: Self-pay

## 2021-08-17 ENCOUNTER — Emergency Department: Payer: Medicare HMO

## 2021-08-17 ENCOUNTER — Inpatient Hospital Stay
Admission: EM | Admit: 2021-08-17 | Discharge: 2021-08-21 | DRG: 308 | Disposition: A | Discharge status: Home / Self Care | Payer: Medicare HMO | Attending: Internal Medicine | Admitting: Internal Medicine

## 2021-08-17 DIAGNOSIS — I25118 Atherosclerotic heart disease of native coronary artery with other forms of angina pectoris: Secondary | ICD-10-CM | POA: Diagnosis not present

## 2021-08-17 DIAGNOSIS — Z87891 Personal history of nicotine dependence: Secondary | ICD-10-CM

## 2021-08-17 DIAGNOSIS — Z823 Family history of stroke: Secondary | ICD-10-CM

## 2021-08-17 DIAGNOSIS — R0902 Hypoxemia: Secondary | ICD-10-CM | POA: Diagnosis present

## 2021-08-17 DIAGNOSIS — Z79891 Long term (current) use of opiate analgesic: Secondary | ICD-10-CM

## 2021-08-17 DIAGNOSIS — E669 Obesity, unspecified: Secondary | ICD-10-CM | POA: Diagnosis present

## 2021-08-17 DIAGNOSIS — G4700 Insomnia, unspecified: Secondary | ICD-10-CM | POA: Diagnosis present

## 2021-08-17 DIAGNOSIS — Z20822 Contact with and (suspected) exposure to covid-19: Secondary | ICD-10-CM | POA: Diagnosis present

## 2021-08-17 DIAGNOSIS — L89623 Pressure ulcer of left heel, stage 3: Secondary | ICD-10-CM | POA: Diagnosis present

## 2021-08-17 DIAGNOSIS — I4891 Unspecified atrial fibrillation: Secondary | ICD-10-CM

## 2021-08-17 DIAGNOSIS — K219 Gastro-esophageal reflux disease without esophagitis: Secondary | ICD-10-CM | POA: Diagnosis present

## 2021-08-17 DIAGNOSIS — R531 Weakness: Secondary | ICD-10-CM

## 2021-08-17 DIAGNOSIS — Z9842 Cataract extraction status, left eye: Secondary | ICD-10-CM

## 2021-08-17 DIAGNOSIS — I480 Paroxysmal atrial fibrillation: Secondary | ICD-10-CM | POA: Diagnosis not present

## 2021-08-17 DIAGNOSIS — R0609 Other forms of dyspnea: Secondary | ICD-10-CM | POA: Diagnosis not present

## 2021-08-17 DIAGNOSIS — E119 Type 2 diabetes mellitus without complications: Secondary | ICD-10-CM

## 2021-08-17 DIAGNOSIS — Z952 Presence of prosthetic heart valve: Secondary | ICD-10-CM | POA: Diagnosis not present

## 2021-08-17 DIAGNOSIS — I2510 Atherosclerotic heart disease of native coronary artery without angina pectoris: Secondary | ICD-10-CM | POA: Diagnosis present

## 2021-08-17 DIAGNOSIS — D649 Anemia, unspecified: Secondary | ICD-10-CM | POA: Diagnosis present

## 2021-08-17 DIAGNOSIS — F32A Depression, unspecified: Secondary | ICD-10-CM | POA: Diagnosis present

## 2021-08-17 DIAGNOSIS — E1169 Type 2 diabetes mellitus with other specified complication: Secondary | ICD-10-CM

## 2021-08-17 DIAGNOSIS — E559 Vitamin D deficiency, unspecified: Secondary | ICD-10-CM | POA: Diagnosis present

## 2021-08-17 DIAGNOSIS — Z961 Presence of intraocular lens: Secondary | ICD-10-CM | POA: Diagnosis present

## 2021-08-17 DIAGNOSIS — E785 Hyperlipidemia, unspecified: Secondary | ICD-10-CM | POA: Diagnosis present

## 2021-08-17 DIAGNOSIS — E860 Dehydration: Secondary | ICD-10-CM | POA: Diagnosis present

## 2021-08-17 DIAGNOSIS — D509 Iron deficiency anemia, unspecified: Secondary | ICD-10-CM

## 2021-08-17 DIAGNOSIS — Z79899 Other long term (current) drug therapy: Secondary | ICD-10-CM

## 2021-08-17 DIAGNOSIS — G8929 Other chronic pain: Secondary | ICD-10-CM | POA: Diagnosis present

## 2021-08-17 DIAGNOSIS — Z885 Allergy status to narcotic agent status: Secondary | ICD-10-CM

## 2021-08-17 DIAGNOSIS — Z7901 Long term (current) use of anticoagulants: Secondary | ICD-10-CM

## 2021-08-17 DIAGNOSIS — Z7982 Long term (current) use of aspirin: Secondary | ICD-10-CM

## 2021-08-17 DIAGNOSIS — I5032 Chronic diastolic (congestive) heart failure: Secondary | ICD-10-CM | POA: Diagnosis present

## 2021-08-17 DIAGNOSIS — Z9841 Cataract extraction status, right eye: Secondary | ICD-10-CM

## 2021-08-17 DIAGNOSIS — E1140 Type 2 diabetes mellitus with diabetic neuropathy, unspecified: Secondary | ICD-10-CM | POA: Diagnosis present

## 2021-08-17 DIAGNOSIS — E039 Hypothyroidism, unspecified: Secondary | ICD-10-CM | POA: Diagnosis present

## 2021-08-17 DIAGNOSIS — Z8673 Personal history of transient ischemic attack (TIA), and cerebral infarction without residual deficits: Secondary | ICD-10-CM

## 2021-08-17 DIAGNOSIS — Z7984 Long term (current) use of oral hypoglycemic drugs: Secondary | ICD-10-CM

## 2021-08-17 DIAGNOSIS — Z8249 Family history of ischemic heart disease and other diseases of the circulatory system: Secondary | ICD-10-CM

## 2021-08-17 DIAGNOSIS — I110 Hypertensive heart disease with heart failure: Secondary | ICD-10-CM | POA: Diagnosis present

## 2021-08-17 DIAGNOSIS — Z6837 Body mass index (BMI) 37.0-37.9, adult: Secondary | ICD-10-CM

## 2021-08-17 DIAGNOSIS — Z96641 Presence of right artificial hip joint: Secondary | ICD-10-CM | POA: Diagnosis present

## 2021-08-17 DIAGNOSIS — Z881 Allergy status to other antibiotic agents status: Secondary | ICD-10-CM

## 2021-08-17 LAB — CBC
HCT: 35.4 % — ABNORMAL LOW (ref 39.0–52.0)
Hemoglobin: 11.7 g/dL — ABNORMAL LOW (ref 13.0–17.0)
MCH: 29.5 pg (ref 26.0–34.0)
MCHC: 33.1 g/dL (ref 30.0–36.0)
MCV: 89.2 fL (ref 80.0–100.0)
Platelets: 302 10*3/uL (ref 150–400)
RBC: 3.97 MIL/uL — ABNORMAL LOW (ref 4.22–5.81)
RDW: 15 % (ref 11.5–15.5)
WBC: 10.8 10*3/uL — ABNORMAL HIGH (ref 4.0–10.5)
nRBC: 0 % (ref 0.0–0.2)

## 2021-08-17 LAB — HEMOGLOBIN A1C
Hgb A1c MFr Bld: 7.3 % — ABNORMAL HIGH (ref 4.8–5.6)
Mean Plasma Glucose: 162.81 mg/dL

## 2021-08-17 LAB — BASIC METABOLIC PANEL
Anion gap: 10 (ref 5–15)
BUN: 52 mg/dL — ABNORMAL HIGH (ref 8–23)
CO2: 26 mmol/L (ref 22–32)
Calcium: 8.4 mg/dL — ABNORMAL LOW (ref 8.9–10.3)
Chloride: 101 mmol/L (ref 98–111)
Creatinine, Ser: 1.04 mg/dL (ref 0.61–1.24)
GFR, Estimated: >60 mL/min (ref >60)
Glucose, Bld: 204 mg/dL — ABNORMAL HIGH (ref 70–99)
Potassium: 4.6 mmol/L (ref 3.5–5.1)
Sodium: 137 mmol/L (ref 135–145)

## 2021-08-17 LAB — RESP PANEL BY RT-PCR (FLU A&B, COVID) ARPGX2
Influenza A by PCR: NEGATIVE
Influenza B by PCR: NEGATIVE
SARS Coronavirus 2 by RT PCR: NEGATIVE

## 2021-08-17 LAB — TROPONIN I (HIGH SENSITIVITY)
Troponin I (High Sensitivity): 6 ng/L (ref <18)
Troponin I (High Sensitivity): 5 ng/L (ref <18)

## 2021-08-17 LAB — PROTIME-INR
INR: 1.8 — ABNORMAL HIGH (ref 0.8–1.2)
Prothrombin Time: 21 seconds — ABNORMAL HIGH (ref 11.4–15.2)

## 2021-08-17 MED ORDER — SENNOSIDES-DOCUSATE SODIUM 8.6-50 MG PO TABS: 1.0000 | ORAL_TABLET | Freq: Every evening | ORAL | Status: DC | PRN

## 2021-08-17 MED ORDER — AMIODARONE HCL IN DEXTROSE 360-4.14 MG/200ML-% IV SOLN
60.0000 mg/h | INTRAVENOUS | Status: DC
  Administered 2021-08-17 (×2): 60 mg/h via INTRAVENOUS
  Filled 2021-08-17 (×2): qty 200

## 2021-08-17 MED ORDER — GABAPENTIN 600 MG PO TABS
600.0000 mg | ORAL_TABLET | Freq: Two times a day (BID) | ORAL | Status: DC
  Administered 2021-08-17 – 2021-08-21 (×8): 600 mg via ORAL
  Filled 2021-08-17 (×8): qty 1

## 2021-08-17 MED ORDER — METHADONE HCL 5 MG PO TABS
5.0000 mg | ORAL_TABLET | Freq: Two times a day (BID) | ORAL | Status: DC
  Administered 2021-08-17 – 2021-08-21 (×8): 5 mg via ORAL
  Filled 2021-08-17 (×8): qty 1

## 2021-08-17 MED ORDER — WARFARIN SODIUM 7.5 MG PO TABS
7.5000 mg | ORAL_TABLET | ORAL | Status: AC
  Administered 2021-08-17: 7.5 mg via ORAL
  Filled 2021-08-17: qty 1

## 2021-08-17 MED ORDER — WARFARIN - PHARMACIST DOSING INPATIENT
Freq: Every day | Status: DC
  Filled 2021-08-17: qty 1

## 2021-08-17 MED ORDER — AMIODARONE HCL IN DEXTROSE 360-4.14 MG/200ML-% IV SOLN
30.0000 mg/h | INTRAVENOUS | Status: DC
  Administered 2021-08-17 – 2021-08-19 (×3): 30 mg/h via INTRAVENOUS
  Filled 2021-08-17 (×4): qty 200

## 2021-08-17 MED ORDER — TORSEMIDE 20 MG PO TABS
20.0000 mg | ORAL_TABLET | Freq: Every day | ORAL | Status: DC
  Administered 2021-08-18 – 2021-08-21 (×4): 20 mg via ORAL
  Filled 2021-08-17 (×4): qty 1

## 2021-08-17 MED ORDER — WARFARIN SODIUM 5 MG PO TABS: 5.0000 mg | ORAL_TABLET | Freq: Every day | ORAL | Status: DC

## 2021-08-17 MED ORDER — CYCLOBENZAPRINE HCL 10 MG PO TABS: 10.0000 mg | ORAL_TABLET | Freq: Every day | ORAL | Status: DC | PRN

## 2021-08-17 MED ORDER — AMIODARONE LOAD VIA INFUSION
150.0000 mg | Freq: Once | INTRAVENOUS | Status: AC
  Administered 2021-08-17: 150 mg via INTRAVENOUS
  Filled 2021-08-17: qty 83.34

## 2021-08-17 MED ORDER — ACETAMINOPHEN 650 MG RE SUPP
650.0000 mg | Freq: Four times a day (QID) | RECTAL | Status: DC | PRN
  Filled 2021-08-17: qty 1

## 2021-08-17 MED ORDER — FERROUS SULFATE 325 (65 FE) MG PO TABS
325.0000 mg | ORAL_TABLET | Freq: Two times a day (BID) | ORAL | Status: DC
  Administered 2021-08-18 – 2021-08-21 (×7): 325 mg via ORAL
  Filled 2021-08-17 (×6): qty 1

## 2021-08-17 MED ORDER — CYANOCOBALAMIN 1000 MCG/ML IJ SOLN: 1000.0000 ug | INTRAMUSCULAR | Status: DC

## 2021-08-17 MED ORDER — ONDANSETRON HCL 4 MG PO TABS: 4.0000 mg | ORAL_TABLET | Freq: Four times a day (QID) | ORAL | Status: DC | PRN

## 2021-08-17 MED ORDER — ASPIRIN EC 81 MG PO TBEC
81.0000 mg | DELAYED_RELEASE_TABLET | Freq: Every day | ORAL | Status: DC
  Administered 2021-08-18 – 2021-08-21 (×4): 81 mg via ORAL
  Filled 2021-08-17 (×4): qty 1

## 2021-08-17 MED ORDER — ONDANSETRON HCL 4 MG/2ML IJ SOLN
4.0000 mg | Freq: Four times a day (QID) | INTRAMUSCULAR | Status: DC | PRN
  Administered 2021-08-18: 4 mg via INTRAVENOUS
  Filled 2021-08-17: qty 2

## 2021-08-17 MED ORDER — WARFARIN SODIUM 2.5 MG PO TABS: 2.5000 mg | ORAL_TABLET | Freq: Every day | ORAL | Status: DC

## 2021-08-17 MED ORDER — BISACODYL 10 MG RE SUPP
10.0000 mg | Freq: Every day | RECTAL | Status: DC | PRN
  Filled 2021-08-17: qty 1

## 2021-08-17 MED ORDER — DULOXETINE HCL 30 MG PO CPEP
30.0000 mg | ORAL_CAPSULE | Freq: Every evening | ORAL | Status: DC
  Administered 2021-08-17 – 2021-08-20 (×4): 30 mg via ORAL
  Filled 2021-08-17 (×4): qty 1

## 2021-08-17 MED ORDER — METHIMAZOLE 10 MG PO TABS
20.0000 mg | ORAL_TABLET | Freq: Every day | ORAL | Status: DC
  Administered 2021-08-18 – 2021-08-21 (×4): 20 mg via ORAL
  Filled 2021-08-17 (×4): qty 2

## 2021-08-17 MED ORDER — ADULT MULTIVITAMIN W/MINERALS CH
1.0000 | ORAL_TABLET | Freq: Every day | ORAL | Status: DC
  Administered 2021-08-18 – 2021-08-21 (×4): 1 via ORAL
  Filled 2021-08-17 (×6): qty 1

## 2021-08-17 MED ORDER — METOPROLOL TARTRATE 25 MG PO TABS
25.0000 mg | ORAL_TABLET | Freq: Two times a day (BID) | ORAL | Status: DC
  Administered 2021-08-17 – 2021-08-21 (×8): 25 mg via ORAL
  Filled 2021-08-17 (×8): qty 1

## 2021-08-17 MED ORDER — PRAVASTATIN SODIUM 40 MG PO TABS
40.0000 mg | ORAL_TABLET | Freq: Every evening | ORAL | Status: DC
  Administered 2021-08-18 – 2021-08-20 (×3): 40 mg via ORAL
  Filled 2021-08-17: qty 1
  Filled 2021-08-17: qty 2
  Filled 2021-08-17: qty 1

## 2021-08-17 MED ORDER — ACETAMINOPHEN 325 MG PO TABS: 650.0000 mg | ORAL_TABLET | Freq: Four times a day (QID) | ORAL | Status: DC | PRN

## 2021-08-17 MED ORDER — METOPROLOL TARTRATE 5 MG/5ML IV SOLN
5.0000 mg | INTRAVENOUS | Status: DC | PRN
  Administered 2021-08-17: 5 mg via INTRAVENOUS
  Filled 2021-08-17: qty 5

## 2021-08-17 NOTE — ED Notes (Signed)
Pt moved from ER stretcher top hospital bed by this RN and primary nurse.

## 2021-08-17 NOTE — ED Triage Notes (Signed)
Pt in via EMS from home with c/o SOB with any exertion. Pt also weaker than normal. Pt with cardiac hx. BD 144/89, 137/73

## 2021-08-17 NOTE — Assessment & Plan Note (Signed)
Hemoglobin A1c of 7.3. At home the patient's glucoses are managed with metformin 1000 mg bid, Glipizide XL 5 mg daily, and Actos, 30 mg daily. As inpatient glucoses will be followed with FSBS and SSI. Monitor.

## 2021-08-17 NOTE — Assessment & Plan Note (Addendum)
Continue ASA 81 mg, Lopressor 25 mg bid, pravochol 40 mg daily as at home. Cardiology consulted for transition of IV amiodarone to oral and for evaluation of patient for possible underlying ischemia as a partial cause for the patient's severe hypoxia.  Monitor on telemetry. 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

## 2021-08-17 NOTE — ED Triage Notes (Signed)
Pt here via [facility] from home with SOB. Pt states that he got up to use the bathroom and became very SOB. Pt denies N/V/D. Pt states pain his feet from neuropathy. Pt in NAD in triage.

## 2021-08-17 NOTE — ED Notes (Signed)
Pt bedding changed and pt had NADN. GCS 15. Pt was turned on rt side as reqested

## 2021-08-17 NOTE — Assessment & Plan Note (Addendum)
Mild. Hemoglobin 11.7. Continue oral iron supplementation as at home. On chronic anticoagulation for aortic valve replacement and atrial fibrillation. Monitor.

## 2021-08-17 NOTE — ED Provider Notes (Addendum)
[facility] Emergency Department Provider Note   ____________________________________________   Event Date/Time   First MD Initiated Contact with Patient 08/17/21 1143     (approximate)  I have reviewed the triage vital signs and the nursing notes.   HISTORY  Chief Complaint Shortness of Breath    HPI [name] is a 75 y.o. male with past medical history of hypertension, hyperlipidemia, CAD, stroke, diabetes, atrial fibrillation on Coumadin, and aortic valve replacement who presents to the ED complaining of shortness of breath.  Patient reports that he was feeling fine when he went to bed last night, but woke up this morning feeling very short of breath and weak.  He states he does not feel particularly short of breath laying in bed, but has to stop and rest after only a couple of steps.  He is typically able to get around without any difficulty, denies any associated fevers, cough, or chest pain.  He has not noticed any pain or swelling in his legs, is currently being treated for chronic wound to his left foot that has been healing well.  He denies any palpitations, states he has been in normal sinus rhythm since being treated with amiodarone by his cardiologist.        Past Medical History:  Diagnosis Date   Anemia    CHF (congestive heart failure) (HCC)    Chronic pain    Coronary artery disease    DDD (degenerative disc disease), hip    Diabetes mellitus without complication (HCC)    DJD (degenerative joint disease)    Dysrhythmia    GERD (gastroesophageal reflux disease)    Hyperlipemia    Hypertension    Insomnia    Neuropathy    Paroxysmal A-fib ([place])    Peripheral neuropathy    Stroke [facility])    Thoracic aortic atherosclerosis [facility])     Patient Active Problem List   Diagnosis Date Noted   Abnormal gait 07/19/2020   Adenomatous polyposis 07/19/2020   Bursitis of hip 07/19/2020   Gastritis 07/19/2020   Mild pulmonary  hypertension ([place]) 07/19/2020   Peripheral neuropathy 07/19/2020   Prediabetes 07/19/2020   Thoracic aortic atherosclerosis ([place]) 07/19/2020   B12 deficiency 08/07/2019   Elevated alkaline phosphatase level 12/30/2018   Nausea and vomiting 12/30/2018   Weight loss, unintentional 12/30/2018   Fatty liver 08/10/2018   Bilateral leg edema 02/09/2018   Anemia 11/02/2017   Bilateral carotid artery stenosis 05/14/2017   Paroxysmal A-fib ([place]) 05/14/2017   Contusion of left shoulder 03/23/2017   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   TIA (transient ischemic attack) 12/15/2016   CVA (cerebral vascular accident) ([place]) 12/15/2016   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   Septic shock ([place]) 11/19/2016   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   Pressure injury of skin 10/03/2016   Sepsis ([place]) 10/02/2016   CAD (coronary artery disease) 02/12/2015   Severe aortic valve stenosis 02/12/2015   Benign essential hypertension 01/30/2015   Hyperlipidemia, mixed 01/30/2015   Chronic insomnia 03/28/2014   Diabetes ([place]) 03/28/2014   Post-operative pain 01/19/2013   S/P AVR (aortic valve replacement) 01/19/2013   DJD (degenerative joint disease) 12/09/2012    Past Surgical History:  Procedure Laterality Date   BACK SURGERY     CARDIAC SURGERY     CARDIAC VALVE REPLACEMENT     CARDIOVERSION N/A 05/04/2017   Procedure: CARDIOVERSION;  Surgeon: Corey Skains, MD;  Location: [facility] ORS;  Service: Cardiovascular;  Laterality: N/A;  carpel tunnel release     CATARACT EXTRACTION W/ INTRAOCULAR LENS IMPLANT Bilateral    COLONOSCOPY     COLONOSCOPY WITH ESOPHAGOGASTRODUODENOSCOPY (EGD)     COLONOSCOPY WITH PROPOFOL N/A 05/16/2018   Procedure: COLONOSCOPY WITH PROPOFOL;  Surgeon: Manya Silvas, MD;  Location: [facility] ENDOSCOPY;  Service: Endoscopy;  Laterality: N/A;   COLONOSCOPY WITH PROPOFOL N/A 08/06/2021   Procedure: COLONOSCOPY WITH PROPOFOL;  Surgeon: Tol[place], Benay Pike, MD;  Location:  [facility] ENDOSCOPY;  Service: Gastroenterology;  Laterality: N/A;   ESOPHAGOGASTRODUODENOSCOPY (EGD) WITH PROPOFOL N/A 05/16/2018   Procedure: ESOPHAGOGASTRODUODENOSCOPY (EGD) WITH PROPOFOL;  Surgeon: Manya Silvas, MD;  Location: [facility] ENDOSCOPY;  Service: Endoscopy;  Laterality: N/A;   JOINT REPLACEMENT     TEE WITHOUT CARDIOVERSION N/A 10/08/2016   Procedure: TRANSESOPHAGEAL ECHOCARDIOGRAM (TEE);  Surgeon: Teodoro Spray, MD;  Location: [facility] ORS;  Service: Cardiovascular;  Laterality: N/A;   TEE WITHOUT CARDIOVERSION N/A 11/23/2016   Procedure: TRANSESOPHAGEAL ECHOCARDIOGRAM (TEE);  Surgeon: Isaias Cowman, MD;  Location: [facility] ORS;  Service: Cardiovascular;  Laterality: N/A;   TONSILLECTOMY     VSD REPAIR      Prior to Admission medications   Medication Sig Start Date End Date Taking? Authorizing Provider  amiodarone (PACERONE) 200 MG tablet Take 1 tablet (200 mg total) by mouth daily. 10/09/16   Fritzi Mandes, MD  amitriptyline (ELAVIL) 10 MG tablet Take by mouth. 08/07/19 08/06/20  [provider]  aspirin EC 81 MG tablet Take 81 mg by mouth daily.    [provider]  cyanocobalamin 1000 MCG tablet Take 1,000 mcg by mouth daily.    [provider]  cyclobenzaprine (FLEXERIL) 10 MG tablet Take 10 mg by mouth daily as needed for muscle spasms.     [provider]  DULoxetine (CYMBALTA) 30 MG capsule Take 30 mg by mouth every evening.    [provider]  ferrous sulfate 325 (65 FE) MG tablet Take 1 tablet (325 mg total) by mouth 2 (two) times daily with a meal. 11/03/17   Gladstone Lighter, MD  furosemide (LASIX) 40 MG tablet Take 40 mg by mouth daily.  Patient not taking: Reported on 07/19/2020 03/19/16   [provider]  gabapentin (NEURONTIN) 600 MG tablet Take 600 mg by mouth 2 (two) times daily.    [provider]  KLOR-CON M20 20 MEQ tablet Take by mouth. 07/05/20   [provider]  loratadine (CLARITIN) 10 MG tablet  Take 10 mg by mouth daily.    [provider]  meclizine (ANTIVERT) 25 MG tablet Take 1 tablet by mouth 3 (three) times daily. 07/27/19   [provider]  metFORMIN (GLUCOPHAGE) 1000 MG tablet Take 1,000 mg by mouth 2 (two) times daily. With meals. 03/19/16   [provider]  methadone (DOLOPHINE) 5 MG tablet Take 5 mg by mouth 2 (two) times daily.  09/02/16   [provider]  metoprolol tartrate (LOPRESSOR) 25 MG tablet Take 25 mg by mouth 2 (two) times daily.  07/20/16   [provider]  Multiple Vitamin (MULTI-VITAMINS) TABS Take 1 tablet by mouth daily.    [provider]  pantoprazole (PROTONIX) 40 MG tablet Take 40 mg by mouth daily.    [provider]  pioglitazone (ACTOS) 30 MG tablet Take 30 mg by mouth daily. 11/10/18 11/10/19  [provider]  pravastatin (PRAVACHOL) 40 MG tablet Take 40 mg by mouth every evening.  02/10/16   [provider]  torsemide (DEMADEX) 20 MG  tablet Take by mouth. 07/05/20   [provider]  warfarin (COUMADIN) 5 MG tablet Take 5 mg by mouth daily.    [provider]    Allergies Acetaminophen-codeine, Demeclocycline, Tetracycline, and Tetracyclines & related  Family History  Problem Relation Age of Onset   Stroke Mother    Heart attack Mother    Heart attack Father     Social History Social History   Tobacco Use   Smoking status: Former    Types: Cigarettes    Quit date: 07/09/1996    Years since quitting: 25.1   Smokeless tobacco: Never  Vaping Use   Vaping Use: Never used  Substance Use Topics   Alcohol use: Yes    Alcohol/week: 3.0 standard drinks    Types: 2 Cans of beer, 1 Shots of liquor per week   Drug use: No    Review of Systems  Constitutional: No fever/chills.  Positive for generalized weakness. Eyes: No visual changes. ENT: No sore throat. Cardiovascular: Denies chest pain. Respiratory: Positive for shortness of  breath. Gastrointestinal: No abdominal pain.  No nausea, no vomiting.  No diarrhea.  No constipation. Genitourinary: Negative for dysuria. Musculoskeletal: Negative for back pain. Skin: Negative for rash. Neurological: Negative for headaches, focal weakness or numbness.  ____________________________________________   PHYSICAL EXAM:  VITAL SIGNS: ED Triage Vitals  Enc Vitals Group     BP 08/17/21 0826 124/84     Pulse Rate 08/17/21 0826 90     Resp 08/17/21 0826 18     Temp 08/17/21 0826 98.2 F (36.8 C)     Temp Source 08/17/21 0826 Oral     SpO2 08/17/21 0826 94 %     Weight 08/17/21 0827 269 lb (122 kg)     Height 08/17/21 0827 5\' 10"  (1.778 m)     Head Circumference --      Peak Flow --      Pain Score 08/17/21 0826 6     Pain Loc --      Pain Edu? --      Excl. in [place]? --     Constitutional: Alert and oriented. Eyes: Conjunctivae are normal. Head: Atraumatic. Nose: No congestion/rhinnorhea. Mouth/Throat: Mucous membranes are moist. Neck: Normal ROM Cardiovascular: Normal rate, irregularly irregular rhythm. Grossly normal heart sounds.  2+ radial pulses bilaterally. Respiratory: Normal respiratory effort.  No retractions. Lungs CTAB. Gastrointestinal: Soft and nontender. No distention. Genitourinary: deferred Musculoskeletal: No lower extremity tenderness nor edema.  Dressing in place to left foot with healing ulceration, no purulence. Neurologic:  Normal speech and language. No gross focal neurologic deficits are appreciated. Skin:  Skin is warm, dry and intact. No rash noted. Psychiatric: Mood and affect are normal. Speech and behavior are normal.  ____________________________________________   LABS (all labs ordered are listed, but only abnormal results are displayed)  Labs Reviewed  CBC - Abnormal; Notable for the following components:      Result Value   WBC 10.8 (*)    RBC 3.97 (*)    Hemoglobin 11.7 (*)    HCT 35.4 (*)    All other components within  normal limits  BASIC METABOLIC PANEL - Abnormal; Notable for the following components:   Glucose, Bld 204 (*)    BUN 52 (*)    Calcium 8.4 (*)    All other components within normal limits  PROTIME-INR - Abnormal; Notable for the following components:   Prothrombin Time 21.0 (*)    INR 1.8 (*)  All other components within normal limits  RESP PANEL BY RT-PCR (FLU A&B, COVID) ARPGX2  TROPONIN I (HIGH SENSITIVITY)  TROPONIN I (HIGH SENSITIVITY)   ____________________________________________  EKG  ED ECG REPORT I, Blake Divine, the attending physician, personally viewed and interpreted this ECG.   Date: 08/17/2021  EKG Time: 8:28  Rate: 111  Rhythm: atrial fibrillation  Axis: Normal  Intervals:none  ST&T Change: None   PROCEDURES  Procedure(s) performed (including Critical Care):  .Critical Care Performed by: Blake Divine, MD Authorized by: Blake Divine, MD   Critical care provider statement:    Critical care time (minutes):  45   Critical care time was exclusive of:  Separately billable procedures and treating other patients and teaching time   Critical care was necessary to treat or prevent imminent or life-threatening deterioration of the following conditions:  Cardiac failure   Critical care was time spent personally by me on the following activities:  Development of treatment plan with patient or surrogate, discussions with consultants, evaluation of patient's response to treatment, examination of patient, obtaining history from patient or surrogate, ordering and performing treatments and interventions, ordering and review of laboratory studies, ordering and review of radiographic studies, pulse oximetry, re-evaluation of patient's condition and review of old charts   I assumed direction of critical care for this patient from another provider in my specialty: no     Care discussed with: admitting provider      ____________________________________________   INITIAL IMPRESSION / ASSESSMENT AND PLAN / ED COURSE      75 year old male with past medical history of hypertension, hyperlipidemia, diabetes, CAD, stroke, atrial fibrillation on Coumadin, and aortic valve replacement who presents to the ED complaining of generalized weakness and difficulty walking due to shortness of breath since waking up this morning.  EKG shows atrial fibrillation with mildly elevated rate but no ischemic changes.  Patient denies any chest pain and initial troponin is negative.  We will trend troponin but low suspicion for ACS or PE, suspect his symptoms are related to recurrent atrial fibrillation.  Chest x-ray reviewed by me and shows no infiltrate, edema, or effusion.  No electrolyte abnormality noted.  We will check INR and repeat troponin, plan to further discuss with cardiology.  INR is subtherapeutic at 1.8 so unfortunately patient would not be a candidate for sedation and cardioversion.  Heart rate briefly improved following IV dose of metoprolol in the 80s, however patient had no improvement in his symptoms.  He remains profoundly weak and dyspneic with any exertion.  Case discussed with Dr. Nehemiah Massed of cardiology, who recommends starting patient on IV amiodarone given he will require admission for further management.  Case discussed with hospitalist for admission.      ____________________________________________   FINAL CLINICAL IMPRESSION(S) / ED DIAGNOSES  Final diagnoses:  Atrial fibrillation with RVR ([place])  Dyspnea on exertion  Generalized weakness     ED Discharge Orders     None        Note:  This document was prepared using Dragon voice recognition software and may include unintentional dictation errors.    Blake Divine, MD 08/17/21 1420    Blake Divine, MD 08/17/21 1420

## 2021-08-17 NOTE — ED Notes (Signed)
Blue top sent to lab if needed.

## 2021-08-17 NOTE — Assessment & Plan Note (Signed)
Patient carries a diagnosis of paroxysmal atrial fibrillation, but he states that he has been in sinus rhythm for a long time. He no longer takes amiodarone at home. He has been maintained in sinus on metoprolol 25 mg bid for a long time. The patient awoke this morning with acute SOB and weakness. HR upon presentation was 96 - 135. It was especially high with activity as was his dyspnea. Cardiology was contacted by ED physician and recommendation was made to admit the patient and place him on an amiodarone drip. Cardiology has been consulted for the transitioning of the patient to oral amiodarone and possibly to evaluate the patient for underlying ischemia given his severe dyspnea with minimal activity. The patient is continued on his coumadin with a consult to pharmacy. I believe that he is a little low at 1.8 with history of aortic valve replacement and atrial fibrillation as indications.

## 2021-08-17 NOTE — H&P (Signed)
[name] is an 75 y.o. male.   Chief Complaint: The patient presents with complaints of severe dyspnea and weakness with even slight activity. HPI: The patient is a 75 yr old man who states that he had sudden onset of dyspnea on exertion with even slight activity this morning. He has known PAF, but states that he has been in sinus on lopressor along for a long time now. He has previously also been on amiodarone, but that was stopped remotely. He denies chest pain, shoulder or neck pain.   Past medical history is significant for anemia, CHF (last echo documented demonstrated an EF of 60-65% in 2018), CAD, DDD, DM II (HbA1c of 7.3), GERD, Hyperlipidemia, Hypertension, Neuropathy, PAF, Stroke, and ascvd of the thoracic aorta.   In the ED the patient was found to be in atrial fibrillation on EKG with HR 112-135. Highest with activity. Patient cannot really tolerate walking at all. CXR demonstrated no acute findings. He does appear to be dehydrated with BUN/creatinine ration of 52. Creatinine is at baseline of 1.04. Hgb is somewhat low at 11.7. WBC is elevated slightly at 10.8. Troponin is 6 and 5.   [facility] hospitalists have been consulted to admit the patient for further evaluation and admission.  Cardiology has been consulted.  Past Medical History:  Diagnosis Date   Anemia    CHF (congestive heart failure) (HCC)    Chronic pain    Coronary artery disease    DDD (degenerative disc disease), hip    Diabetes mellitus without complication (HCC)    DJD (degenerative joint disease)    Dysrhythmia    GERD (gastroesophageal reflux disease)    Hyperlipemia    Hypertension    Insomnia    Neuropathy    Paroxysmal A-fib ([name])    Peripheral neuropathy    Stroke [facility])    Thoracic aortic atherosclerosis ([place])     Past Surgical History:  Procedure Laterality Date   BACK SURGERY     CARDIAC SURGERY     CARDIAC VALVE REPLACEMENT     CARDIOVERSION N/A 05/04/2017   Procedure: CARDIOVERSION;   Surgeon: Corey Skains, MD;  Location: [facility] ORS;  Service: Cardiovascular;  Laterality: N/A;   carpel tunnel release     CATARACT EXTRACTION W/ INTRAOCULAR LENS IMPLANT Bilateral    COLONOSCOPY     COLONOSCOPY WITH ESOPHAGOGASTRODUODENOSCOPY (EGD)     COLONOSCOPY WITH PROPOFOL N/A 05/16/2018   Procedure: COLONOSCOPY WITH PROPOFOL;  Surgeon: Manya Silvas, MD;  Location: [facility] ENDOSCOPY;  Service: Endoscopy;  Laterality: N/A;   COLONOSCOPY WITH PROPOFOL N/A 08/06/2021   Procedure: COLONOSCOPY WITH PROPOFOL;  Surgeon: Toledo, Benay Pike, MD;  Location: [facility] ENDOSCOPY;  Service: Gastroenterology;  Laterality: N/A;   ESOPHAGOGASTRODUODENOSCOPY (EGD) WITH PROPOFOL N/A 05/16/2018   Procedure: ESOPHAGOGASTRODUODENOSCOPY (EGD) WITH PROPOFOL;  Surgeon: Manya Silvas, MD;  Location: [facility] ENDOSCOPY;  Service: Endoscopy;  Laterality: N/A;   JOINT REPLACEMENT     TEE WITHOUT CARDIOVERSION N/A 10/08/2016   Procedure: TRANSESOPHAGEAL ECHOCARDIOGRAM (TEE);  Surgeon: Teodoro Spray, MD;  Location: [facility] ORS;  Service: Cardiovascular;  Laterality: N/A;   TEE WITHOUT CARDIOVERSION N/A 11/23/2016   Procedure: TRANSESOPHAGEAL ECHOCARDIOGRAM (TEE);  Surgeon: Isaias Cowman, MD;  Location: [facility] ORS;  Service: Cardiovascular;  Laterality: N/A;   TONSILLECTOMY     VSD REPAIR      Family History  Problem Relation Age of Onset   Stroke Mother    Heart attack Mother    Heart attack Father  Social History:  reports that he quit smoking about 25 years ago. His smoking use included cigarettes. He has never used smokeless tobacco. He reports current alcohol use of about 3.0 standard drinks per week. He reports that he does not use drugs. (Not in a hospital admission)   Allergies:  Allergies  Allergen Reactions   Acetaminophen-Codeine Other (See Comments)    Other reaction(s): Headache Other reaction(s): Headache   Demeclocycline Hives   Tetracycline Hives   Tetracyclines & Related Hives    Review  of Systems - 12 systems have been reviewed with the patient. Positive elements have been included in the HPI above. All other is negative.  General appearance: alert, cooperative, appears stated age, and mild distress Head: Normocephalic, without obvious abnormality, atraumatic Eyes: conjunctivae/corneas clear. PERRL, EOM's intact. Fundi benign. Throat: lips, mucosa, and tongue normal; teeth and gums normal Neck: no adenopathy, no carotid bruit, no JVD, supple, symmetrical, trachea midline, and thyroid not enlarged, symmetric, no tenderness/mass/nodules Resp:  No increased work of breathing. No wheezes, rales, or rhonchi. No tactile fremitus. Chest wall: no tenderness Cardio: irregularly irregular rhythm, S1, S2 normal, no click, no rub, and no gallup GI: soft, non-tender; bowel sounds normal; no masses,  no organomegaly and obese Extremities: extremities normal, atraumatic, no cyanosis or edema Pulses: 2+ and symmetric Skin: Skin color, texture, turgor normal. No rashes or lesions Lymph nodes: Cervical, supraclavicular, and axillary nodes normal. Neurologic: Alert and oriented X 3, normal strength and tone. Normal symmetric reflexes. Normal coordination and gait Incision/Wound: None noted.  Results for orders placed or performed during the hospital encounter of 08/17/21 (from the past 48 hour(s))  CBC     Status: Abnormal   Collection Time: 08/17/21  8:29 AM  Result Value Ref Range   WBC 10.8 (H) 4.0 - 10.5 K/uL   RBC 3.97 (L) 4.22 - 5.81 MIL/uL   Hemoglobin 11.7 (L) 13.0 - 17.0 g/dL   HCT 35.4 (L) 39.0 - 52.0 %   MCV 89.2 80.0 - 100.0 fL   MCH 29.5 26.0 - 34.0 pg   MCHC 33.1 30.0 - 36.0 g/dL   RDW 15.0 11.5 - 15.5 %   Platelets 302 150 - 400 K/uL   nRBC 0.0 0.0 - 0.2 %    Comment: Performed at [facility] [address], [place], [place] [address]  Basic metabolic panel     Status: Abnormal   Collection Time: 08/17/21  8:29 AM  Result Value Ref Range   Sodium 137 135  - 145 mmol/L   Potassium 4.6 3.5 - 5.1 mmol/L   Chloride 101 98 - 111 mmol/L   CO2 26 22 - 32 mmol/L   Glucose, Bld 204 (H) 70 - 99 mg/dL    Comment: Glucose reference range applies only to samples taken after fasting for at least 8 hours.   BUN 52 (H) 8 - 23 mg/dL   Creatinine, Ser 1.04 0.61 - 1.24 mg/dL   Calcium 8.4 (L) 8.9 - 10.3 mg/dL   GFR, Estimated >60 >60 mL/min    Comment: (NOTE) Calculated using the CKD-EPI Creatinine Equation (2021)    Anion gap 10 5 - 15    Comment: Performed at [facility], [place], [place] [address]  Troponin I (High Sensitivity)     Status: None   Collection Time: 08/17/21  8:29 AM  Result Value Ref Range   Troponin I (High Sensitivity) 6 <18 ng/L    Comment: (NOTE) Elevated high sensitivity  troponin I (hsTnI) values and significant  changes across serial measurements may suggest ACS but many other  chronic and acute conditions are known to elevate hsTnI results.  Refer to the "Links" section for chest pain algorithms and additional  guidance. Performed at [facility], [address] [place], [place] [address]   Hemoglobin A1c     Status: Abnormal   Collection Time: 08/17/21  8:29 AM  Result Value Ref Range   Hgb A1c MFr Bld 7.3 (H) 4.8 - 5.6 %    Comment: (NOTE) Pre diabetes:          5.7%-6.4%  Diabetes:              >6.4%  Glycemic control for   <7.0% adults with diabetes    Mean Plasma Glucose 162.81 mg/dL    Comment: Performed at [facility] [address] [place], [place] [address]  Troponin I (High Sensitivity)     Status: None   Collection Time: 08/17/21 12:35 PM  Result Value Ref Range   Troponin I (High Sensitivity) 5 <18 ng/L    Comment: (NOTE) Elevated high sensitivity troponin I (hsTnI) values and significant  changes across serial measurements may suggest ACS but many other  chronic and acute conditions are known to elevate hsTnI results.  Refer to the "Links" section for  chest pain algorithms and additional  guidance. Performed at [facility], [address] [place], [place] [address]   Protime-INR     Status: Abnormal   Collection Time: 08/17/21 12:35 PM  Result Value Ref Range   Prothrombin Time 21.0 (H) 11.4 - 15.2 seconds   INR 1.8 (H) 0.8 - 1.2    Comment: (NOTE) INR goal varies based on device and disease states. Performed at [facility], [address] [place], [place] [address]   Resp Panel by RT-PCR (Flu A&B, Covid) Nasopharyngeal Swab     Status: None   Collection Time: 08/17/21 12:35 PM   Specimen: Nasopharyngeal Swab; Nasopharyngeal(NP) swabs in vial transport medium  Result Value Ref Range   SARS Coronavirus 2 by RT PCR NEGATIVE NEGATIVE    Comment: (NOTE) SARS-CoV-2 target nucleic acids are NOT DETECTED.  The SARS-CoV-2 RNA is generally detectable in upper respiratory specimens during the acute phase of infection. The lowest concentration of SARS-CoV-2 viral copies this assay can detect is 138 copies/mL. A negative result does not preclude SARS-Cov-2 infection and should not be used as the sole basis for treatment or other patient management decisions. A negative result may occur with  improper specimen collection/handling, submission of specimen other than nasopharyngeal swab, presence of viral mutation(s) within the areas targeted by this assay, and inadequate number of viral copies(<138 copies/mL). A negative result must be combined with clinical observations, patient history, and epidemiological information. The expected result is Negative.  Fact Sheet for Patients:  EntrepreneurPulse.com.au  Fact Sheet for Healthcare Providers:  IncredibleEmployment.be  This test is no t yet approved or cleared by the [place] FDA and  has been authorized for detection and/or diagnosis of SARS-CoV-2 by FDA under an Emergency Use Authorization (EUA). This EUA will remain  in  effect (meaning this test can be used) for the duration of the COVID-19 declaration under Section 564(b)(1) of the Act, 21 U.S.C.section 360bbb-3(b)(1), unless the authorization is terminated  or revoked sooner.       Influenza A by PCR NEGATIVE NEGATIVE   Influenza B by PCR NEGATIVE NEGATIVE    Comment: (  NOTE) The Xpert Xpress SARS-CoV-2/FLU/RSV plus assay is intended as an aid in the diagnosis of influenza from Nasopharyngeal swab specimens and should not be used as a sole basis for treatment. Nasal washings and aspirates are unacceptable for Xpert Xpress SARS-CoV-2/FLU/RSV testing.  Fact Sheet for Patients: EntrepreneurPulse.com.au  Fact Sheet for Healthcare Providers: IncredibleEmployment.be  This test is not yet approved or cleared by the [place] FDA and has been authorized for detection and/or diagnosis of SARS-CoV-2 by FDA under an Emergency Use Authorization (EUA). This EUA will remain in effect (meaning this test can be used) for the duration of the COVID-19 declaration under Section 564(b)(1) of the Act, 21 U.S.C. section 360bbb-3(b)(1), unless the authorization is terminated or revoked.  Performed at [facility], [address] [place], [place] [address]    @[email or web address] @  Blood pressure (!) 111/97, pulse (!) 114, temperature 98.2 F (36.8 C), temperature source Oral, resp. rate 18, height 5\' 10"  (1.778 m), weight 118.7 kg, SpO2 97 %.    Assessment/Plan Anemia Mild. Hemoglobin 11.7. Continue oral iron supplementation as at home. On chronic anticoagulation for aortic valve replacement and atrial fibrillation. Monitor.  Diabetes ([place]) Hemoglobin A1c of 7.3. At home the patient's glucoses are managed with metformin 1000 mg bid, Glipizide XL 5 mg daily, and Actos, 30 mg daily. As inpatient glucoses will be followed with FSBS and SSI. Monitor.  Coronary artery disease Continue ASA 81 mg, Lopressor 25 mg bid,  pravochol 40 mg daily as at home. Cardiology consulted for transition of IV amiodarone to oral and for evaluation of patient for possible underlying ischemia as a partial cause for the patient's severe hypoxia.  Monitor on telemetry. Three vessel non critical by cath 2014  Atrial fibrillation with rapid ventricular response [facility]) Patient carries a diagnosis of paroxysmal atrial fibrillation, but he states that he has been in sinus rhythm for a long time. He no longer takes amiodarone at home. He has been maintained in sinus on metoprolol 25 mg bid for a long time. The patient awoke this morning with acute SOB and weakness. HR upon presentation was 96 - 135. It was especially high with activity as was his dyspnea. Cardiology was contacted by ED physician and recommendation was made to admit the patient and place him on an amiodarone drip. Cardiology has been consulted for the transitioning of the patient to oral amiodarone and possibly to evaluate the patient for underlying ischemia given his severe dyspnea with minimal activity. The patient is continued on his coumadin with a consult to pharmacy. I believe that he is a little low at 1.8 with history of aortic valve replacement and atrial fibrillation as indications.   I have seen and examined this patient myself. I have spent 82 minutes in his evaluation and admission.  [name] 08/17/2021, 8:04 PM

## 2021-08-17 NOTE — Progress Notes (Signed)
ANTICOAGULATION CONSULT NOTE - Initial Consult  Pharmacy Consult for warfarin Indication: atrial fibrillation  Allergies  Allergen Reactions   Acetaminophen-Codeine Other (See Comments)    Other reaction(s): Headache Other reaction(s): Headache   Demeclocycline Hives   Tetracycline Hives   Tetracyclines & Related Hives    Patient Measurements: Height: 5\' 10"  (177.8 cm) Weight: 122 kg (269 lb) IBW/kg (Calculated) : 73   Vital Signs: Temp: 98.2 F (36.8 C) (10/30 0826) Temp Source: Oral (10/30 0826) BP: 127/55 (10/30 1300) Pulse Rate: 93 (10/30 1300)  Labs: Recent Labs    08/17/21 0829 08/17/21 1235  HGB 11.7*  --   HCT 35.4*  --   PLT 302  --   LABPROT  --  21.0*  INR  --  1.8*  CREATININE 1.04  --   TROPONINIHS 6 5    Estimated Creatinine Clearance: 80.4 mL/min (by C-G formula based on SCr of 1.04 mg/dL).   Medical History: Past Medical History:  Diagnosis Date   Anemia    CHF (congestive heart failure) (HCC)    Chronic pain    Coronary artery disease    DDD (degenerative disc disease), hip    Diabetes mellitus without complication (HCC)    DJD (degenerative joint disease)    Dysrhythmia    GERD (gastroesophageal reflux disease)    Hyperlipemia    Hypertension    Insomnia    Neuropathy    Paroxysmal A-fib (HCC)    Peripheral neuropathy    Stroke [facility])    Thoracic aortic atherosclerosis (HCC)     Medications:  (Not in a hospital admission)  Scheduled:  Infusions:   amiodarone 60 mg/hr (08/17/21 1429)   Followed by   amiodarone     PRN: acetaminophen **OR** acetaminophen, bisacodyl, metoprolol tartrate, ondansetron **OR** ondansetron (ZOFRAN) IV, senna-docusate Anti-infectives (From admission, onward)    None        Assessment: 75YOM presenting with shortness of breath and weakness. PMH includes Afib, CAD, HLD, DM, and stroke.  EKG shows atrial fibrillation with mildly elevated rate.   Trending troponins  (5>6), though currently  there is low suspicion for ACS or PE per provider. CBC stable.   Warfarin PTA: 5 mg on Tuesday, Wed, Thurs, Saturday and Sunday. 2.5 mg on Monday and Friday only. -per discussion with med rec technician.  Drug Drug Interactions Amiodarone: enhanced anticoagulant effect and increased serum concentration   Date INR Warfarin Dose  10/29 -- PTA warfarin 5 mg  10/30 1.8     Goal of Therapy:  INR 2-3 Monitor platelets by anticoagulation protocol: Yes   Plan:  Aggressive dosing to get patient to therapeutic INR as soon as possible d/t patient being in symptomatic atrial fibrillation. Will give warfarin 7.5 mg x 1 today.  Daily INR, CBC.   Wynelle Cleveland, PharmD Pharmacy Resident  08/17/2021 4:11 PM

## 2021-08-18 ENCOUNTER — Encounter: Payer: Medicare HMO | Admitting: Physician Assistant

## 2021-08-18 DIAGNOSIS — Z9841 Cataract extraction status, right eye: Secondary | ICD-10-CM | POA: Diagnosis not present

## 2021-08-18 DIAGNOSIS — E559 Vitamin D deficiency, unspecified: Secondary | ICD-10-CM | POA: Diagnosis present

## 2021-08-18 DIAGNOSIS — E039 Hypothyroidism, unspecified: Secondary | ICD-10-CM | POA: Diagnosis present

## 2021-08-18 DIAGNOSIS — I2510 Atherosclerotic heart disease of native coronary artery without angina pectoris: Secondary | ICD-10-CM | POA: Diagnosis present

## 2021-08-18 DIAGNOSIS — Z8249 Family history of ischemic heart disease and other diseases of the circulatory system: Secondary | ICD-10-CM | POA: Diagnosis not present

## 2021-08-18 DIAGNOSIS — R531 Weakness: Secondary | ICD-10-CM | POA: Diagnosis not present

## 2021-08-18 DIAGNOSIS — R0902 Hypoxemia: Secondary | ICD-10-CM | POA: Diagnosis present

## 2021-08-18 DIAGNOSIS — I5032 Chronic diastolic (congestive) heart failure: Secondary | ICD-10-CM | POA: Diagnosis present

## 2021-08-18 DIAGNOSIS — D649 Anemia, unspecified: Secondary | ICD-10-CM | POA: Diagnosis present

## 2021-08-18 DIAGNOSIS — I4891 Unspecified atrial fibrillation: Secondary | ICD-10-CM | POA: Diagnosis not present

## 2021-08-18 DIAGNOSIS — Z952 Presence of prosthetic heart valve: Secondary | ICD-10-CM | POA: Diagnosis not present

## 2021-08-18 DIAGNOSIS — Z20822 Contact with and (suspected) exposure to covid-19: Secondary | ICD-10-CM | POA: Diagnosis present

## 2021-08-18 DIAGNOSIS — Z6837 Body mass index (BMI) 37.0-37.9, adult: Secondary | ICD-10-CM | POA: Diagnosis not present

## 2021-08-18 DIAGNOSIS — I480 Paroxysmal atrial fibrillation: Secondary | ICD-10-CM | POA: Diagnosis present

## 2021-08-18 DIAGNOSIS — E785 Hyperlipidemia, unspecified: Secondary | ICD-10-CM | POA: Diagnosis present

## 2021-08-18 DIAGNOSIS — I110 Hypertensive heart disease with heart failure: Secondary | ICD-10-CM | POA: Diagnosis present

## 2021-08-18 DIAGNOSIS — Z87891 Personal history of nicotine dependence: Secondary | ICD-10-CM | POA: Diagnosis not present

## 2021-08-18 DIAGNOSIS — L89623 Pressure ulcer of left heel, stage 3: Secondary | ICD-10-CM | POA: Diagnosis present

## 2021-08-18 DIAGNOSIS — Z8673 Personal history of transient ischemic attack (TIA), and cerebral infarction without residual deficits: Secondary | ICD-10-CM | POA: Diagnosis not present

## 2021-08-18 DIAGNOSIS — F32A Depression, unspecified: Secondary | ICD-10-CM | POA: Diagnosis present

## 2021-08-18 DIAGNOSIS — Z823 Family history of stroke: Secondary | ICD-10-CM | POA: Diagnosis not present

## 2021-08-18 DIAGNOSIS — E1140 Type 2 diabetes mellitus with diabetic neuropathy, unspecified: Secondary | ICD-10-CM | POA: Diagnosis present

## 2021-08-18 DIAGNOSIS — Z961 Presence of intraocular lens: Secondary | ICD-10-CM | POA: Diagnosis present

## 2021-08-18 DIAGNOSIS — E669 Obesity, unspecified: Secondary | ICD-10-CM | POA: Diagnosis present

## 2021-08-18 DIAGNOSIS — R0609 Other forms of dyspnea: Secondary | ICD-10-CM | POA: Diagnosis present

## 2021-08-18 DIAGNOSIS — Z9842 Cataract extraction status, left eye: Secondary | ICD-10-CM | POA: Diagnosis not present

## 2021-08-18 DIAGNOSIS — D509 Iron deficiency anemia, unspecified: Secondary | ICD-10-CM | POA: Diagnosis not present

## 2021-08-18 DIAGNOSIS — E860 Dehydration: Secondary | ICD-10-CM | POA: Diagnosis present

## 2021-08-18 LAB — CBC
HCT: 24.7 % — ABNORMAL LOW (ref 39.0–52.0)
Hemoglobin: 8.4 g/dL — ABNORMAL LOW (ref 13.0–17.0)
MCH: 29.9 pg (ref 26.0–34.0)
MCHC: 34 g/dL (ref 30.0–36.0)
MCV: 87.9 fL (ref 80.0–100.0)
Platelets: 231 10*3/uL (ref 150–400)
RBC: 2.81 MIL/uL — ABNORMAL LOW (ref 4.22–5.81)
RDW: 15.2 % (ref 11.5–15.5)
WBC: 11.7 10*3/uL — ABNORMAL HIGH (ref 4.0–10.5)
nRBC: 0 % (ref 0.0–0.2)

## 2021-08-18 LAB — COMPREHENSIVE METABOLIC PANEL
ALT: 15 U/L (ref 0–44)
AST: 18 U/L (ref 15–41)
Albumin: 2.9 g/dL — ABNORMAL LOW (ref 3.5–5.0)
Alkaline Phosphatase: 71 U/L (ref 38–126)
Anion gap: 9 (ref 5–15)
BUN: 61 mg/dL — ABNORMAL HIGH (ref 8–23)
CO2: 23 mmol/L (ref 22–32)
Calcium: 8 mg/dL — ABNORMAL LOW (ref 8.9–10.3)
Chloride: 104 mmol/L (ref 98–111)
Creatinine, Ser: 0.82 mg/dL (ref 0.61–1.24)
GFR, Estimated: >60 mL/min (ref >60)
Glucose, Bld: 254 mg/dL — ABNORMAL HIGH (ref 70–99)
Potassium: 3.9 mmol/L (ref 3.5–5.1)
Sodium: 136 mmol/L (ref 135–145)
Total Bilirubin: 0.3 mg/dL (ref 0.3–1.2)
Total Protein: 6.5 g/dL (ref 6.5–8.1)

## 2021-08-18 LAB — IRON AND TIBC
Iron: 136 ug/dL (ref 45–182)
Saturation Ratios: 38 % (ref 17.9–39.5)
TIBC: 358 ug/dL (ref 250–450)
UIBC: 222 ug/dL

## 2021-08-18 LAB — PROTIME-INR
INR: 2.1 — ABNORMAL HIGH (ref 0.8–1.2)
Prothrombin Time: 23.8 seconds — ABNORMAL HIGH (ref 11.4–15.2)

## 2021-08-18 LAB — CBG MONITORING, ED: Glucose-Capillary: 243 mg/dL — ABNORMAL HIGH (ref 70–99)

## 2021-08-18 LAB — FOLATE: Folate: 18.9 ng/mL (ref >5.9)

## 2021-08-18 LAB — GLUCOSE, CAPILLARY: Glucose-Capillary: 150 mg/dL — ABNORMAL HIGH (ref 70–99)

## 2021-08-18 LAB — VITAMIN D 25 HYDROXY (VIT D DEFICIENCY, FRACTURES): Vit D, 25-Hydroxy: 15.3 ng/mL — ABNORMAL LOW (ref 30–100)

## 2021-08-18 LAB — MAGNESIUM: Magnesium: 2.2 mg/dL (ref 1.7–2.4)

## 2021-08-18 LAB — PHOSPHORUS: Phosphorus: 3.3 mg/dL (ref 2.5–4.6)

## 2021-08-18 LAB — VITAMIN B12: Vitamin B-12: 205 pg/mL (ref 180–914)

## 2021-08-18 MED ORDER — INSULIN ASPART 100 UNIT/ML IJ SOLN
0.0000 [IU] | Freq: Three times a day (TID) | INTRAMUSCULAR | Status: DC
Start: 2021-08-18 — End: 2021-08-21
  Administered 2021-08-18: 3 [IU] via SUBCUTANEOUS
  Administered 2021-08-19 – 2021-08-20 (×4): 2 [IU] via SUBCUTANEOUS
  Administered 2021-08-20: 3 [IU] via SUBCUTANEOUS
  Administered 2021-08-20: 1 [IU] via SUBCUTANEOUS
  Administered 2021-08-21: 2 [IU] via SUBCUTANEOUS
  Filled 2021-08-18 (×8): qty 1

## 2021-08-18 MED ORDER — INSULIN ASPART 100 UNIT/ML IJ SOLN: 0.0000 [IU] | Freq: Every day | INTRAMUSCULAR | Status: DC

## 2021-08-18 MED ORDER — POTASSIUM CHLORIDE CRYS ER 20 MEQ PO TBCR
40.0000 meq | EXTENDED_RELEASE_TABLET | Freq: Once | ORAL | Status: AC
  Administered 2021-08-18: 40 meq via ORAL
  Filled 2021-08-18: qty 2

## 2021-08-18 MED ORDER — WARFARIN SODIUM 2.5 MG PO TABS
2.5000 mg | ORAL_TABLET | Freq: Once | ORAL | Status: AC
  Administered 2021-08-18: 2.5 mg via ORAL
  Filled 2021-08-18: qty 1

## 2021-08-18 NOTE — Progress Notes (Addendum)
ANTICOAGULATION CONSULT NOTE - Initial Consult  Pharmacy Consult for warfarin Indication: atrial fibrillation  Allergies  Allergen Reactions   Acetaminophen-Codeine Other (See Comments)    Other reaction(s): Headache Other reaction(s): Headache   Demeclocycline Hives   Tetracycline Hives   Tetracyclines & Related Hives    Patient Measurements: Height: 5\' 10"  (177.8 cm) Weight: 118.7 kg (261 lb 11 oz) IBW/kg (Calculated) : 73   Vital Signs: BP: 116/69 (10/31 0830) Pulse Rate: 102 (10/31 0830)  Labs: Recent Labs    08/17/21 0829 08/17/21 1235 08/18/21 0649  HGB 11.7*  --  8.4*  HCT 35.4*  --  24.7*  PLT 302  --  231  LABPROT  --  21.0* 23.8*  INR  --  1.8* 2.1*  CREATININE 1.04  --  0.82  TROPONINIHS 6 5  --      Estimated Creatinine Clearance: 100.5 mL/min (by C-G formula based on SCr of 0.82 mg/dL).   Medical History: Past Medical History:  Diagnosis Date   Anemia    CHF (congestive heart failure) (HCC)    Chronic pain    Coronary artery disease    DDD (degenerative disc disease), hip    Diabetes mellitus without complication (HCC)    DJD (degenerative joint disease)    Dysrhythmia    GERD (gastroesophageal reflux disease)    Hyperlipemia    Hypertension    Insomnia    Neuropathy    Paroxysmal A-fib (HCC)    Peripheral neuropathy    Stroke [facility])    Thoracic aortic atherosclerosis (HCC)     Medications:  (Not in a hospital admission) Scheduled:   aspirin EC  81 mg Oral Daily   cyanocobalamin  1,000 mcg Intramuscular Q30 days   DULoxetine  30 mg Oral QPM   ferrous sulfate  325 mg Oral BID WC   gabapentin  600 mg Oral BID   methadone  5 mg Oral BID   methimazole  20 mg Oral Daily   metoprolol tartrate  25 mg Oral BID   multivitamin with minerals  1 tablet Oral Daily   pravastatin  40 mg Oral QPM   torsemide  20 mg Oral Daily   Warfarin - Pharmacist Dosing Inpatient   Does not apply q1600   Infusions:   amiodarone 30 mg/hr (08/18/21 0801)    PRN: acetaminophen **OR** acetaminophen, bisacodyl, cyclobenzaprine, metoprolol tartrate, ondansetron **OR** ondansetron (ZOFRAN) IV, senna-docusate Anti-infectives (From admission, onward)    None       Assessment: 75YOM presenting with shortness of breath and weakness. PMH includes Afib, CAD, HLD, DM, and stroke.  EKG shows atrial fibrillation with mildly elevated rate.   Warfarin PTA (per discussion with med rec technician):  5 mg on Tue/Wed/Thurs/Sat/Sun.  2.5 mg on Monday and Friday only  DDI: Amiodarone - enhanced anticoagulant effect and increased serum concentration  Date INR Warfarin Dose  10/29 -- 5 mg (PTA)  10/30 1.8 7.5 mg  10/31 2.1 2.5 mg    Goal of Therapy:  INR 2-3 Monitor platelets by anticoagulation protocol: Yes   Plan:  INR therapeutic. Will give pt home dose of 2.5 mg tonight. Daily INR and CBC per protocol   Sherilyn Banker, PharmD Clinical Pharmacist  08/18/2021 9:43 AM

## 2021-08-18 NOTE — Consult Note (Signed)
[facility] Consultation Note  Patient ID: [name], MRN: [id number], DOB/AGE: Aug 07, 1946 75 y.o. Admit date: 08/17/2021   Date of Consult: 08/18/2021 Primary Physician: Idelle Crouch, MD Primary Cardiologist: Nehemiah Massed  Chief Complaint:  Chief Complaint  Patient presents with   Shortness of Breath   Reason for Consult:  Atrial fibrillation  HPI: 75 y.o. 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  History suggest that he had atrial fibrillation with rapid ventricular rate previously treated with amiodarone and the patient has had good control of that.  There was concerns that the patient was short of breath and had side effects of the amiodarone and therefore was discontinued at the time.  Over this last several months the patient has done well but now has recurrent atrial fibrillation with rapid ventricular rate with weakness fatigue and concerns for worsening symptoms.  EKG has shown atrial fibrillation with rapid ventricular rate in the 110 230 bpm.  He does have a chest x-ray showing no evidence of congestive heart failure and hemoglobin of 11.7 white blood cell count of 10.8 and a troponin troponin of 6.  Currently he will need further medication management and will has been started on an amiodarone drip.  He tolerating this well  Past Medical History:  Diagnosis Date   Anemia    CHF (congestive heart failure) (HCC)    Chronic pain    Coronary artery disease    DDD (degenerative disc disease), hip    Diabetes mellitus without complication (HCC)    DJD (degenerative joint disease)    Dysrhythmia    GERD  (gastroesophageal reflux disease)    Hyperlipemia    Hypertension    Insomnia    Neuropathy    Paroxysmal A-fib ([place])    Peripheral neuropathy    Stroke [facility])    Thoracic aortic atherosclerosis ([place])       Surgical History:  Past Surgical History:  Procedure Laterality Date   BACK SURGERY     CARDIAC SURGERY     CARDIAC VALVE REPLACEMENT     CARDIOVERSION N/A 05/04/2017   Procedure: CARDIOVERSION;  Surgeon: Corey Skains, MD;  Location: [facility] ORS;  Service: Cardiovascular;  Laterality: N/A;   carpel tunnel release     CATARACT EXTRACTION W/ INTRAOCULAR LENS IMPLANT Bilateral    COLONOSCOPY     COLONOSCOPY WITH ESOPHAGOGASTRODUODENOSCOPY (EGD)     COLONOSCOPY WITH PROPOFOL N/A 05/16/2018   Procedure: COLONOSCOPY WITH PROPOFOL;  Surgeon: Manya Silvas, MD;  Location: [facility] ENDOSCOPY;  Service: Endoscopy;  Laterality: N/A;   COLONOSCOPY WITH PROPOFOL N/A 08/06/2021   Procedure: COLONOSCOPY WITH PROPOFOL;  Surgeon: Toledo, Benay Pike, MD;  Location: [facility] ENDOSCOPY;  Service: Gastroenterology;  Laterality: N/A;   ESOPHAGOGASTRODUODENOSCOPY (EGD) WITH PROPOFOL N/A 05/16/2018   Procedure: ESOPHAGOGASTRODUODENOSCOPY (EGD) WITH PROPOFOL;  Surgeon: Manya Silvas, MD;  Location: [facility] ENDOSCOPY;  Service: Endoscopy;  Laterality: N/A;   JOINT REPLACEMENT     TEE WITHOUT CARDIOVERSION N/A 10/08/2016   Procedure: TRANSESOPHAGEAL ECHOCARDIOGRAM (TEE);  Surgeon: Teodoro Spray, MD;  Location: [facility] ORS;  Service: Cardiovascular;  Laterality: N/A;   TEE WITHOUT CARDIOVERSION N/A 11/23/2016   Procedure: TRANSESOPHAGEAL ECHOCARDIOGRAM (TEE);  Surgeon: Isaias Cowman, MD;  Location: [facility] ORS;  Service: Cardiovascular;  Laterality: N/A;   TONSILLECTOMY     VSD REPAIR       Home Meds: Prior to Admission medications   Medication Sig Start Date End Date Taking? Authorizing Provider  aspirin EC 81 MG tablet Take 81 mg by mouth daily.   Yes [provider]  DULoxetine (CYMBALTA) 30 MG  capsule Take 30 mg by mouth every evening.   Yes [provider]  ferrous sulfate 325 (65 FE) MG tablet Take 1 tablet (325 mg total) by mouth 2 (two) times daily with a meal. 11/03/17  Yes Gladstone Lighter, MD  gabapentin (NEURONTIN) 600 MG tablet Take 600 mg by mouth 2 (two) times daily.   Yes [provider]  glipiZIDE (GLUCOTROL XL) 5 MG 24 hr tablet Take 5 mg by mouth daily. 09/18/20 09/18/21 Yes [provider]  loratadine (CLARITIN) 10 MG tablet Take 10 mg by mouth daily.   Yes [provider]  metFORMIN (GLUCOPHAGE) 1000 MG tablet Take 1,000 mg by mouth 2 (two) times daily. With meals. 03/19/16  Yes [provider]  methadone (DOLOPHINE) 5 MG tablet Take 5 mg by mouth 2 (two) times daily.  09/02/16  Yes [provider]  methimazole (TAPAZOLE) 10 MG tablet Take 20 mg by mouth daily. 11/27/20  Yes [provider]  metoprolol tartrate (LOPRESSOR) 25 MG tablet Take 25 mg by mouth 2 (two) times daily.  07/20/16  Yes [provider]  Multiple Vitamin (MULTI-VITAMINS) TABS Take 1 tablet by mouth daily.   Yes [provider]  pravastatin (PRAVACHOL) 40 MG tablet Take 40 mg by mouth every evening.  02/10/16  Yes [provider]  torsemide (DEMADEX) 20 MG tablet Take by mouth. 07/05/20  Yes [provider]  warfarin (COUMADIN) 2.5 MG tablet Take 2.5 mg by mouth. Take on Monday and Friday only   Yes [provider]  warfarin (COUMADIN) 5 MG tablet Take 5 mg by mouth daily. Take on Tuesday, Wednesday, Thursday, Saturday, and Sunday   Yes [provider]  amiodarone (PACERONE) 200 MG tablet Take 1 tablet (200 mg total) by mouth daily. Patient not taking: No sig reported 10/09/16   Fritzi Mandes, MD  amitriptyline (ELAVIL) 10 MG tablet Take by mouth. Patient not taking: Reported on 08/17/2021 08/07/19 08/06/20  [provider]  cyanocobalamin (,VITAMIN B-12,) 1000 MCG/ML injection Inject  1,000 mcg into the muscle every 30 (thirty) days.    [provider]  cyanocobalamin 1000 MCG tablet Take 1,000 mcg by mouth daily. Patient not taking: No sig reported    [provider]  cyclobenzaprine (FLEXERIL) 10 MG tablet Take 10 mg by mouth daily as needed for muscle spasms.     [provider]  furosemide (LASIX) 40 MG tablet Take 40 mg by mouth daily.  Patient not taking: No sig reported 03/19/16   [provider]  KLOR-CON M20 20 MEQ tablet Take by mouth. Patient not taking: No sig reported 07/05/20   [provider]  meclizine (ANTIVERT) 25 MG tablet Take 1 tablet by mouth 3 (three) times daily. Patient not taking: No sig reported 07/27/19   [provider]  pantoprazole (PROTONIX) 40 MG tablet Take 40 mg by mouth daily. Patient not taking: No sig reported    [provider]  pioglitazone (ACTOS) 30 MG tablet Take 30 mg by mouth daily. Patient not taking: Reported on 08/17/2021 11/10/18 11/10/19  [provider]    Inpatient Medications:   aspirin EC  81 mg Oral Daily   cyanocobalamin  1,000 mcg Intramuscular Q30 days   DULoxetine  30 mg Oral QPM   ferrous sulfate  325 mg Oral BID WC   gabapentin  600 mg Oral BID   methadone  5 mg Oral BID   methimazole  20 mg Oral Daily   metoprolol tartrate  25 mg Oral BID   multivitamin with minerals  1 tablet Oral Daily   pravastatin  40 mg Oral QPM   torsemide  20 mg Oral Daily   Warfarin - Pharmacist Dosing Inpatient   Does not apply q1600    amiodarone 30 mg/hr (08/18/21 0801)    Allergies:  Allergies  Allergen Reactions   Acetaminophen-Codeine Other (See Comments)    Other reaction(s): Headache Other reaction(s): Headache   Demeclocycline Hives   Tetracycline Hives   Tetracyclines & Related Hives    Social History   Socioeconomic History   Marital status: Married    Spouse name: Not on file   Number of children: Not on file   Years of education: Not  on file   Highest education level: Not on file  Occupational History   Not on file  Tobacco Use   Smoking status: Former    Types: Cigarettes    Quit date: 07/09/1996    Years since quitting: 25.1   Smokeless tobacco: Never  Vaping Use   Vaping Use: Never used  Substance and Sexual Activity   Alcohol use: Yes    Alcohol/week: 3.0 standard drinks    Types: 2 Cans of beer, 1 Shots of liquor per week   Drug use: No   Sexual activity: Yes  Other Topics Concern   Not on file  Social History Narrative   Not on file   Social Determinants of Health   Financial Resource Strain: Not on file  Food Insecurity: Not on file  Transportation Needs: Not on file  Physical Activity: Not on file  Stress: Not on file  Social Connections: Not on file  Intimate Partner Violence: Not on file     Family History  Problem Relation Age of Onset   Stroke Mother    Heart attack Mother    Heart attack Father      Review of Systems Positive for palpitation shortness of breath Negative for: General:  chills, fever, night sweats or weight changes.  Cardiovascular: PND orthopnea syncope dizziness  Dermatological skin lesions rashes Respiratory: Cough congestion Urologic: Frequent urination urination at night and hematuria Abdominal: negative for nausea, vomiting, diarrhea, bright red blood per rectum, melena, or hematemesis Neurologic: negative for visual changes, and/or hearing changes  All other systems reviewed and are otherwise negative except as noted above.  Labs: No results for input(s): CKTOTAL, CKMB, TROPONINI in the last 72 hours. Lab Results  Component Value Date   WBC 11.7 (H) 08/18/2021   HGB 8.4 (L) 08/18/2021   HCT 24.7 (L) 08/18/2021   MCV 87.9 08/18/2021   PLT 231 08/18/2021    Recent Labs  Lab 08/18/21 0649  NA 136  K 3.9  CL 104  CO2 23  BUN 61*  CREATININE 0.82  CALCIUM 8.0*  PROT 6.5  BILITOT 0.3  ALKPHOS 71  ALT 15  AST 18  GLUCOSE 254*   Lab Results   Component Value Date   CHOL 163 12/17/2016   HDL 48 12/17/2016   LDLCALC 85 12/17/2016   TRIG 152 (H) 12/17/2016   No results found for: DDIMER  Radiology/Studies:  DG  Chest 2 View  Result Date: 08/17/2021 CLINICAL DATA:  SOB EXAM: CHEST - 2 VIEW COMPARISON:  None. FINDINGS: Status post prosthetic heart valve. Median sternotomy. Anterior cervical fusion hardware. The heart size is within normal limits. No pleural effusion. No pneumothorax. No mass or consolidation. No acute osseous abnormality. Calcifications of the aortic arch. IMPRESSION: No acute findings in the chest. Electronically Signed   By: Albin Felling M.D.   On: 08/17/2021 09:15    EKG: Atrial fibrillation with rapid ventricular rate  Weights: Filed Weights   08/17/21 0827 08/17/21 1700  Weight: 122 kg 118.7 kg     Physical Exam: Blood pressure (!) 111/53, pulse (!) 110, temperature 98.2 F (36.8 C), temperature source Oral, resp. rate 18, height 5\' 10"  (1.778 m), weight 118.7 kg, SpO2 100 %. Body mass index is 37.55 kg/m. General: Well developed, well nourished, in no acute distress. Head eyes ears nose throat: Normocephalic, atraumatic, sclera non-icteric, no xanthomas, nares are without discharge. No apparent thyromegaly and/or mass  Lungs: Normal respiratory effort.  no wheezes, no rales, no rhonchi.  Heart: Irregular 1 with normal S1 MECHANICAL S2.  2+ aortic murmur gallop, no rub, PMI is normal size and placement, carotid upstroke normal without bruit, jugular venous pressure is normal Abdomen: Soft, non-tender, non-distended with normoactive bowel sounds. No hepatomegaly. No rebound/guarding. No obvious abdominal masses. Abdominal aorta is normal size without bruit Extremities: Plus edema. no cyanosis, no clubbing, no ulcers  Peripheral : 2+ bilateral upper extremity pulses, 2+ bilateral femoral pulses, 2+ bilateral dorsal pedal pulse Neuro: Alert and oriented. No facial asymmetry. No focal deficit. Moves all  extremities spontaneously. Musculoskeletal: Normal muscle tone without kyphosis Psych:  Responds to questions appropriately with a normal affect.    Assessment: 75 year old male with known aortic valve stenosis status post mechanical aortic valve replacement hypertension hyperlipidemia diabetes lower extremity edema and neuropathy and paroxysmal nonvalvular atrial fibrillation with rapid ventricular rate but no evidence of myocardial infarction or acute coronary syndrome or congestive heart failure at this time  Plan: 1.  Continue amiodarone drip and loading of amiodarone for 24hours for further treatment of heart rate control of atrial fibrillation with rapid ventricular rate and possible conversion to normal rhythm 2.  Continue anticoagulation with warfarin for goal INR between 2 and 3 for mechanical aortic valve and atrial fibrillation 3.  Metoprolol for heart rate control of atrial fibrillation and previous diastolic dysfunction heart failure 4.  No further cardiac diagnostics necessary at this time 5.  Lipid management with continuation of pravastatin 's 6.  Continue torsemide for lower extremity edema which has been relatively stable Signed, Corey Skains M.D. [facility] 08/18/2021, 8:53 AM

## 2021-08-18 NOTE — Progress Notes (Signed)
[facility] Progress Note  Patient: [name]    GXQ:[id number]  DOA: 08/17/2021     Date of Service: the patient was seen and examined on 08/18/2021  Chief Complaint  Patient presents with   Shortness of Breath   Brief hospital course: The patient is a 75 yr old man who states that he had sudden onset of dyspnea on exertion with even slight activity this morning. He has known PAF, but states that he has been in sinus on lopressor along for a long time now. He has previously also been on amiodarone, but that was stopped remotely. He denies chest pain, shoulder or neck pain.    Past medical history is significant for anemia, CHF (last echo documented demonstrated an EF of 60-65% in 2018), CAD, DDD, DM II (HbA1c of 7.3), GERD, Hyperlipidemia, Hypertension, Neuropathy, PAF, Stroke, and ascvd of the thoracic aorta.    In the ED the patient was found to be in atrial fibrillation on EKG with HR 112-135. Highest with activity. Patient cannot really tolerate walking at all. CXR demonstrated no acute findings. He does appear to be dehydrated with BUN/creatinine ration of 52. Creatinine is at baseline of 1.04. Hgb is somewhat low at 11.7. WBC is elevated slightly at 10.8. Troponin is 6 and 5.    [facility] have been consulted to admit the patient for further evaluation and admission.   Cardiology has been consulted.    Assessment and Plan:  Atrial fibrillation with rapid ventricular response (HCC) H/o paroxysmal atrial fibrillation, in sinus rhythm for a long time. no longer takes amiodarone at home. He has been maintained in sinus on metoprolol 25 mg bid for a long time. The patient awoke this morning with acute SOB and weakness. HR upon presentation was 96 - 135. It was especially high with activity as was his dyspnea.  Cardiology was contacted by ED physician and recommendation was made to admit the patient and place him on an amiodarone drip.  Cardiology has been consulted  for the transitioning of the patient to oral amiodarone and possibly to evaluate the patient for underlying ischemia given his severe dyspnea with minimal activity.  The patient is continued on his coumadin with a consult to pharmacy. I believe that he is a little low at 1.8 with history of aortic valve replacement and atrial fibrillation as indications.  Follow cardiology for further recommendation   Anemia Mild. Hemoglobin 11.7---8.4  Continue oral iron supplementation as at home. On chronic anticoagulation for aortic valve replacement and atrial fibrillation. Monitor. Iron profile and folate within normal range, B12 is pending Monitor H&H and transfuse if hemoglobin less than 7, check FOBT  Diabetes (HCC) Hemoglobin A1c of 7.3. At home the patient's glucoses are managed with metformin 1000 mg bid, Glipizide XL 5 mg daily, and Actos, 30 mg daily. As inpatient glucoses will be followed with FSBS and SSI. Monitor.   Coronary artery disease Continue ASA 81 mg, Lopressor 25 mg bid, pravochol 40 mg daily as at home. Cardiology consulted for transition of IV amiodarone to oral and for evaluation of patient for possible underlying ischemia as a partial cause for the patient's severe hypoxia.  Monitor on telemetry. Three vessel non critical by cath 2014   Lower extremity edema, continued torsemide 20 mg PTA dose   History of depression, continued home medications  History hypothyroid, continued home dose methimazole  Body mass index is 37.55 kg/m.  Interventions:       Diet: Heart healthy and  carb modified DVT Prophylaxis: Therapeutic Anticoagulation with Coumadin    Advance goals of care discussion: Full code  Family Communication: family was present at bedside, at the time of interview.  The pt provided permission to discuss medical plan with the family. Opportunity was given to ask question and all questions were answered satisfactorily.   Disposition:  Pt is from Home, admitted  with A. Fib rvr, still has A. Fib RVR and on amio drip, which precludes a safe discharge. Discharge to Home, when A. Fib is under control and cleared by cardiology..  Subjective: Patient was admitted overnight due to A. fib with RVR and shortness of breath, patient is feeling improvement in the shortness of breath, still has A. fib with RVR on exertion and shortness of breath on exertion but at rest he is feeling fine.  No any other active issues no chest pain  Physical Exam: General:  alert oriented to time, place, and person.  Appear in mild distress, affect appropriate Eyes: PERRLA ENT: Oral Mucosa Clear, moist  Neck: no JVD,  Cardiovascular: Irregularly irregular rhythm, no Murmur,  Respiratory: good respiratory effort, Bilateral Air entry equal and Decreased, no Crackles, no wheezes Abdomen: Bowel Sound present, Soft and no tenderness,  Skin: no rashes Extremities: mild Pedal edema, no calf tenderness Neurologic: without any new focal findings Gait not checked due to patient safety concerns  Vitals:   08/18/21 1400 08/18/21 1500 08/18/21 1530 08/18/21 1600  BP: (!) 90/58 (!) 110/54 108/62 102/61  Pulse: 94 88 87 (!) 58  Resp: 16 20 17 17   Temp:      TempSrc:      SpO2: 98% (!) 89% 99% 100%  Weight:      Height:        Intake/Output Summary (Last 24 hours) at 08/18/2021 1807 Last data filed at 08/18/2021 0730 Gross per 24 hour  Intake --  Output 700 ml  Net -700 ml   Filed Weights   08/17/21 0827 08/17/21 1700  Weight: 122 kg 118.7 kg    Data Reviewed: I have personally reviewed and interpreted daily labs, tele strips, imagings as discussed above. I reviewed all nursing notes, pharmacy notes, vitals, pertinent old records I have discussed plan of care as described above with RN and patient/family.  CBC: Recent Labs  Lab 08/17/21 0829 08/18/21 0649  WBC 10.8* 11.7*  HGB 11.7* 8.4*  HCT 35.4* 24.7*  MCV 89.2 87.9  PLT 302 960   Basic Metabolic  Panel: Recent Labs  Lab 08/17/21 0829 08/18/21 0649 08/18/21 1432  NA 137 136  --   K 4.6 3.9  --   CL 101 104  --   CO2 26 23  --   GLUCOSE 204* 254*  --   BUN 52* 61*  --   CREATININE 1.04 0.82  --   CALCIUM 8.4* 8.0*  --   MG  --   --  2.2  PHOS  --   --  3.3    Studies: No results found.  Scheduled Meds:  aspirin EC  81 mg Oral Daily   cyanocobalamin  1,000 mcg Intramuscular Q30 days   DULoxetine  30 mg Oral QPM   ferrous sulfate  325 mg Oral BID WC   gabapentin  600 mg Oral BID   insulin aspart  0-5 Units Subcutaneous QHS   insulin aspart  0-9 Units Subcutaneous TID WC   methadone  5 mg Oral BID   methimazole  20 mg Oral Daily  metoprolol tartrate  25 mg Oral BID   multivitamin with minerals  1 tablet Oral Daily   pravastatin  40 mg Oral QPM   torsemide  20 mg Oral Daily   Warfarin - Pharmacist Dosing Inpatient   Does not apply q1600   Continuous Infusions:  amiodarone 30 mg/hr (08/18/21 0801)   PRN Meds: acetaminophen **OR** acetaminophen, bisacodyl, cyclobenzaprine, metoprolol tartrate, ondansetron **OR** ondansetron (ZOFRAN) IV, senna-docusate  Time spent: 35 minutes  Author: Val Riles. MD [facility] 08/18/2021 6:07 PM  To reach On-call, see care teams to locate the attending and reach out to them via [email or web address]. If 7PM-7AM, please contact night-coverage If you still have difficulty reaching the attending provider, please page the [facility] (Director on Call) for [facility] on amion for assistance.

## 2021-08-19 ENCOUNTER — Inpatient Hospital Stay: Payer: Medicare HMO

## 2021-08-19 DIAGNOSIS — R531 Weakness: Secondary | ICD-10-CM

## 2021-08-19 DIAGNOSIS — R0609 Other forms of dyspnea: Secondary | ICD-10-CM

## 2021-08-19 LAB — CBC
HCT: 21.8 % — ABNORMAL LOW (ref 39.0–52.0)
Hemoglobin: 6.8 g/dL — ABNORMAL LOW (ref 13.0–17.0)
MCH: 28 pg (ref 26.0–34.0)
MCHC: 31.2 g/dL (ref 30.0–36.0)
MCV: 89.7 fL (ref 80.0–100.0)
Platelets: 263 10*3/uL (ref 150–400)
RBC: 2.43 MIL/uL — ABNORMAL LOW (ref 4.22–5.81)
RDW: 15.6 % — ABNORMAL HIGH (ref 11.5–15.5)
WBC: 14 10*3/uL — ABNORMAL HIGH (ref 4.0–10.5)
nRBC: 0.4 % — ABNORMAL HIGH (ref 0.0–0.2)

## 2021-08-19 LAB — PHOSPHORUS: Phosphorus: 2.9 mg/dL (ref 2.5–4.6)

## 2021-08-19 LAB — THYROID PANEL WITH TSH
Free Thyroxine Index: 1.6 (ref 1.2–4.9)
T3 Uptake Ratio: 26 % (ref 24–39)
T4, Total: 6.1 ug/dL (ref 4.5–12.0)
TSH: 3.56 u[IU]/mL (ref 0.450–4.500)

## 2021-08-19 LAB — BASIC METABOLIC PANEL
Anion gap: 4 — ABNORMAL LOW (ref 5–15)
BUN: 40 mg/dL — ABNORMAL HIGH (ref 8–23)
CO2: 28 mmol/L (ref 22–32)
Calcium: 8.3 mg/dL — ABNORMAL LOW (ref 8.9–10.3)
Chloride: 105 mmol/L (ref 98–111)
Creatinine, Ser: 0.93 mg/dL (ref 0.61–1.24)
GFR, Estimated: >60 mL/min (ref >60)
Glucose, Bld: 180 mg/dL — ABNORMAL HIGH (ref 70–99)
Potassium: 4 mmol/L (ref 3.5–5.1)
Sodium: 137 mmol/L (ref 135–145)

## 2021-08-19 LAB — MAGNESIUM: Magnesium: 2.3 mg/dL (ref 1.7–2.4)

## 2021-08-19 LAB — LACTATE DEHYDROGENASE: LDH: 115 U/L (ref 98–192)

## 2021-08-19 LAB — PROTIME-INR
INR: 2.7 — ABNORMAL HIGH (ref 0.8–1.2)
Prothrombin Time: 29 seconds — ABNORMAL HIGH (ref 11.4–15.2)

## 2021-08-19 LAB — HEMOGLOBIN AND HEMATOCRIT, BLOOD
HCT: 23.6 % — ABNORMAL LOW (ref 39.0–52.0)
Hemoglobin: 8 g/dL — ABNORMAL LOW (ref 13.0–17.0)

## 2021-08-19 LAB — GLUCOSE, CAPILLARY
Glucose-Capillary: 169 mg/dL — ABNORMAL HIGH (ref 70–99)
Glucose-Capillary: 181 mg/dL — ABNORMAL HIGH (ref 70–99)
Glucose-Capillary: 181 mg/dL — ABNORMAL HIGH (ref 70–99)
Glucose-Capillary: 107 mg/dL — ABNORMAL HIGH (ref 70–99)

## 2021-08-19 LAB — PREPARE RBC (CROSSMATCH)

## 2021-08-19 MED ORDER — AMIODARONE HCL 200 MG PO TABS
200.0000 mg | ORAL_TABLET | Freq: Two times a day (BID) | ORAL | Status: DC
  Administered 2021-08-19 – 2021-08-21 (×5): 200 mg via ORAL
  Filled 2021-08-19 (×5): qty 1

## 2021-08-19 MED ORDER — WARFARIN SODIUM 2.5 MG PO TABS
2.5000 mg | ORAL_TABLET | Freq: Once | ORAL | Status: AC
  Administered 2021-08-19: 2.5 mg via ORAL
  Filled 2021-08-19: qty 1

## 2021-08-19 MED ORDER — SODIUM CHLORIDE 0.9% IV SOLUTION: Freq: Once | INTRAVENOUS | Status: AC

## 2021-08-19 NOTE — Progress Notes (Signed)
ANTICOAGULATION CONSULT NOTE - Initial Consult  Pharmacy Consult for warfarin Indication: atrial fibrillation  Allergies  Allergen Reactions   Acetaminophen-Codeine Other (See Comments)    Other reaction(s): Headache Other reaction(s): Headache   Demeclocycline Hives   Tetracycline Hives   Tetracyclines & Related Hives    Patient Measurements: Height: 5\' 10"  (177.8 cm) Weight: 118.7 kg (261 lb 11 oz) IBW/kg (Calculated) : 73   Vital Signs: Temp: 97.1 F (36.2 C) (11/01 0738) Temp Source: Oral (11/01 0400) BP: 112/56 (11/01 0738) Pulse Rate: 100 (11/01 0738)  Labs: Recent Labs    08/17/21 0829 08/17/21 1235 08/18/21 0649 08/19/21 0503  HGB 11.7*  --  8.4* 6.8*  HCT 35.4*  --  24.7* 21.8*  PLT 302  --  231 263  LABPROT  --  21.0* 23.8* 29.0*  INR  --  1.8* 2.1* 2.7*  CREATININE 1.04  --  0.82 0.93  TROPONINIHS 6 5  --   --      Estimated Creatinine Clearance: 88.6 mL/min (by C-G formula based on SCr of 0.93 mg/dL).   Medical History: Past Medical History:  Diagnosis Date   Anemia    CHF (congestive heart failure) (HCC)    Chronic pain    Coronary artery disease    DDD (degenerative disc disease), hip    Diabetes mellitus without complication (HCC)    DJD (degenerative joint disease)    Dysrhythmia    GERD (gastroesophageal reflux disease)    Hyperlipemia    Hypertension    Insomnia    Neuropathy    Paroxysmal A-fib (HCC)    Peripheral neuropathy    Stroke [facility])    Thoracic aortic atherosclerosis (HCC)     Medications:  Medications Prior to Admission  Medication Sig Dispense Refill Last Dose   aspirin EC 81 MG tablet Take 81 mg by mouth daily.   08/17/2021   DULoxetine (CYMBALTA) 30 MG capsule Take 30 mg by mouth every evening.   08/16/2021 at NIGHT   ferrous sulfate 325 (65 FE) MG tablet Take 1 tablet (325 mg total) by mouth 2 (two) times daily with a meal. 60 tablet 3 08/17/2021 at AM   gabapentin (NEURONTIN) 600 MG tablet Take 600 mg by  mouth 2 (two) times daily.   08/17/2021   glipiZIDE (GLUCOTROL XL) 5 MG 24 hr tablet Take 5 mg by mouth daily.   08/17/2021 at AM   loratadine (CLARITIN) 10 MG tablet Take 10 mg by mouth daily.   08/17/2021 at AM   metFORMIN (GLUCOPHAGE) 1000 MG tablet Take 1,000 mg by mouth 2 (two) times daily. With meals.   08/17/2021 at AM   methadone (DOLOPHINE) 5 MG tablet Take 5 mg by mouth 2 (two) times daily.    08/17/2021 at AM   methimazole (TAPAZOLE) 10 MG tablet Take 20 mg by mouth daily.   08/17/2021 at AM   metoprolol tartrate (LOPRESSOR) 25 MG tablet Take 25 mg by mouth 2 (two) times daily.    08/17/2021 at AM   Multiple Vitamin (MULTI-VITAMINS) TABS Take 1 tablet by mouth daily.   08/17/2021   pravastatin (PRAVACHOL) 40 MG tablet Take 40 mg by mouth every evening.    08/16/2021 at PM   torsemide (DEMADEX) 20 MG tablet Take by mouth.   08/17/2021 at AM   warfarin (COUMADIN) 2.5 MG tablet Take 2.5 mg by mouth. Take on Monday and Friday only   08/15/2021   warfarin (COUMADIN) 5 MG tablet Take 5 mg by mouth  daily. Take on Tuesday, Wednesday, Thursday, Saturday, and Sunday   08/16/2021 at PM   amiodarone (PACERONE) 200 MG tablet Take 1 tablet (200 mg total) by mouth daily. (Patient not taking: No sig reported) 30 tablet 1 Not Taking   amitriptyline (ELAVIL) 10 MG tablet Take by mouth. (Patient not taking: Reported on 08/17/2021)   Not Taking   cyanocobalamin (,VITAMIN B-12,) 1000 MCG/ML injection Inject 1,000 mcg into the muscle every 30 (thirty) days.   07/18/2021   cyanocobalamin 1000 MCG tablet Take 1,000 mcg by mouth daily. (Patient not taking: No sig reported)   Not Taking   cyclobenzaprine (FLEXERIL) 10 MG tablet Take 10 mg by mouth daily as needed for muscle spasms.    PRN at PRN   KLOR-CON M20 20 MEQ tablet Take by mouth. (Patient not taking: No sig reported)   Not Taking   meclizine (ANTIVERT) 25 MG tablet Take 1 tablet by mouth 3 (three) times daily. (Patient not taking: No sig reported)   Not  Taking   pantoprazole (PROTONIX) 40 MG tablet Take 40 mg by mouth daily. (Patient not taking: No sig reported)   Not Taking   pioglitazone (ACTOS) 30 MG tablet Take 30 mg by mouth daily. (Patient not taking: Reported on 08/17/2021)   Not Taking   Scheduled:   aspirin EC  81 mg Oral Daily   cyanocobalamin  1,000 mcg Intramuscular Q30 days   DULoxetine  30 mg Oral QPM   ferrous sulfate  325 mg Oral BID WC   gabapentin  600 mg Oral BID   insulin aspart  0-5 Units Subcutaneous QHS   insulin aspart  0-9 Units Subcutaneous TID WC   methadone  5 mg Oral BID   methimazole  20 mg Oral Daily   metoprolol tartrate  25 mg Oral BID   multivitamin with minerals  1 tablet Oral Daily   pravastatin  40 mg Oral QPM   torsemide  20 mg Oral Daily   Warfarin - Pharmacist Dosing Inpatient   Does not apply q1600   Infusions:   amiodarone 30 mg/hr (08/19/21 0749)   PRN: acetaminophen **OR** acetaminophen, bisacodyl, cyclobenzaprine, metoprolol tartrate, ondansetron **OR** ondansetron (ZOFRAN) IV, senna-docusate Anti-infectives (From admission, onward)    None       Assessment: 75YOM presenting with shortness of breath and weakness. PMH includes Afib, CAD, HLD, DM, and stroke.  EKG shows atrial fibrillation with mildly elevated rate. Pt also has hx of mechanical aortic valve. Hgb is trending down. No note of any bleeding.   Warfarin PTA (per discussion with med rec technician):  5 mg on Tue/Wed/Thurs/Sat/Sun.  2.5 mg on Monday and Friday only  DDI: Amiodarone - enhanced anticoagulant effect and increased serum concentration  Date INR Warfarin Dose  10/29 -- 5 mg (PTA)  10/30 1.8 7.5 mg  10/31 2.1 2.5 mg  11/1 2.7 2.5 mg    Goal of Therapy:  INR 2-3 due to mechanical aortic valve will aim for an INR of 2.5.  Monitor platelets by anticoagulation protocol: Yes   Plan:  INR is therapeutic. Will given warfarin 2.5 mg x 1 today (50% of home dose) as Hgb is trending down and pt received a big  warfarin dose on 10/30. Also pt was started on amiodarone. Daily INR and CBC at least every 3 days.    Oswald Hillock, PharmD Clinical Pharmacist  08/19/2021 8:45 AM

## 2021-08-19 NOTE — Plan of Care (Signed)

## 2021-08-19 NOTE — Progress Notes (Signed)
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  Patient does have a history of atrial fibrillation with rapid ventricular rate previously treated with amiodarone for which he had good control.  There had been some concerns that patient had some shortness of breath that may have been a side effect of amiodarone and therefore was discontinued as an outpatient.  Patient has done well for several months off of his amiodarone but now has a recurrence of atrial fibrillation with rapid ventricular rate with associated symptoms including weakness, fatigue.  EKG has shown atrial fibrillation with rapid ventricular rate of approximately 110 bpm.  CXR showed no evidence of CHF.  He has been started on amiodarone drip and has seen much improvement in his heart rate control currently in the 80s on telemetry.    11/1 He is tolerating this medication well however he still has concerns of shortness of breath on exertion, weakness, fatigue.  There is also been some concerns of significant anemia with a hemoglobin on admission of 11.7, currently 6.8.  Currently awaiting a CT of the abdomen and fecal occult blood testing.  Review of Systems: Positive for: palpitations, shortness of breath Negative for: Vision change, hearing change, syncope, dizziness, nausea, vomiting,diarrhea, bloody stool, stomach pain, cough, congestion, diaphoresis, urinary frequency, urinary pain,skin lesions, skin rashes Others previously listed  Objective: Telemetry: atrial fibrillation with controlled rate on 80's Physical Exam: Blood pressure (!) 100/52, pulse  93, temperature 98.2 F (36.8 C), temperature source Oral, resp. rate 18, height 5\' 10"  (1.778 m), weight 118.7 kg, SpO2 99 %. Body mass index is 37.55 kg/m. General: Well developed, well nourished, in no acute distress. Head: Normocephalic, atraumatic, sclera non-icteric, no xanthomas, nares are without discharge. Neck: No apparent masses Lungs: Normal respirations with no wheezes, no rhonchi, no rales , no crackles   Heart: irregular rate and rhythm, normal S1 mechanical S2, 2+ aortic murmur, no rub, no gallop, PMI is normal size and placement, carotid upstroke normal without bruit, jugular venous pressure normal Abdomen: Soft, non-tender, non-distended with normoactive bowel sounds. No hepatosplenomegaly. Abdominal aorta is normal size without bruit Extremities: trace edema, no clubbing, no cyanosis, no ulcers,  Peripheral: 2+ radial, 2+ femoral, 2+ dorsal pedal pulses Neuro: Alert and oriented. Moves all extremities spontaneously. Psych:  Responds to questions appropriately with a normal affect.   Intake/Output Summary (Last 24 hours) at 08/19/2021 1303 Last data filed at 08/19/2021 1023 Gross per 24 hour  Intake 739.62 ml  Output 700 ml  Net 39.62 ml    Inpatient Medications:   sodium chloride   Intravenous Once   amiodarone  200 mg Oral BID   aspirin EC  81 mg Oral Daily   cyanocobalamin  1,000 mcg Intramuscular Q30 days   DULoxetine  30 mg Oral QPM   ferrous sulfate  325 mg Oral BID WC   gabapentin  600 mg Oral BID   insulin aspart  0-5 Units Subcutaneous QHS   insulin aspart  0-9 Units Subcutaneous TID WC   methadone  5 mg Oral BID   methimazole  20 mg Oral Daily   metoprolol tartrate  25 mg Oral BID   multivitamin with minerals  1 tablet Oral Daily  pravastatin  40 mg Oral QPM   torsemide  20 mg Oral Daily   warfarin  2.5 mg Oral ONCE-1600   Warfarin - Pharmacist Dosing Inpatient   Does not apply q1600   Infusions:     Labs: Recent Labs    08/18/21 0649  08/18/21 1432 08/19/21 0503  NA 136  --  137  K 3.9  --  4.0  CL 104  --  105  CO2 23  --  28  GLUCOSE 254*  --  180*  BUN 61*  --  40*  CREATININE 0.82  --  0.93  CALCIUM 8.0*  --  8.3*  MG  --  2.2 2.3  PHOS  --  3.3 2.9   Recent Labs    08/18/21 0649  AST 18  ALT 15  ALKPHOS 71  BILITOT 0.3  PROT 6.5  ALBUMIN 2.9*   Recent Labs    08/18/21 0649 08/19/21 0503  WBC 11.7* 14.0*  HGB 8.4* 6.8*  HCT 24.7* 21.8*  MCV 87.9 89.7  PLT 231 263   No results for input(s): CKTOTAL, CKMB, TROPONINI in the last 72 hours. Invalid input(s): POCBNP Recent Labs    08/17/21 0829  HGBA1C 7.3*     Weights: Filed Weights   08/17/21 0827 08/17/21 1700  Weight: 122 kg 118.7 kg     Radiology/Studies:  DG Chest 2 View  Result Date: 08/17/2021 CLINICAL DATA:  SOB EXAM: CHEST - 2 VIEW COMPARISON:  None. FINDINGS: Status post prosthetic heart valve. Median sternotomy. Anterior cervical fusion hardware. The heart size is within normal limits. No pleural effusion. No pneumothorax. No mass or consolidation. No acute osseous abnormality. Calcifications of the aortic arch. IMPRESSION: No acute findings in the chest. Electronically Signed   By: Albin Felling M.D.   On: 08/17/2021 09:15     Assessment and Recommendation  75 y.o. male with a past medical history of aortic valve stenosis status post mechanical aortic valve replacement, hypertension, hyperlipidemia, diabetes, lower extremity edema, paroxysmal nonvalvular atrial fibrillation with rapid ventricular rate.  Patient has no current evidence of myocardial infarction or acute coronary syndrome or congestive heart failure at this time.  There is some concern for significant anemia with hemoglobin on admission of 11.7, currently at 6.8 requiring transfusion.  Plan: -Continue with conservative management of atrial fibrillation at this time while patient undergoes diagnostic testing and management for significant anemia  -Discontinue  amiodarone drip and start oral amiodarone 200 mg twice daily for further heart rate control of atrial fibrillation and possible conversion to normal sinus rhythm. -Continue anticoagulation with warfarin for goal INR between 2 and 3 for mechanical aortic valve and atrial fibrillation -Continue metoprolol for heart rate control of atrial fibrillation and previous diastolic dysfunction heart failure -No further cardiac diagnostics necessary at this time -Continue cholesterol management with pravastatin -Continue torsemide for lower extremity edema which is currently well controlled with  Signed, Jettie Booze, PA-C

## 2021-08-19 NOTE — Consult Note (Addendum)
[place] Nurse Consult Note: Reason for Consult: Consult requested for left heel. Pt states he is followed by the outpatient wound care center prior to admission and he is due for his next appointment on Monday.  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  Pressure Injury POA: Yes Measurement: 1X.5X.2cm, 100% red and moist Drainage (amount, consistency, odor) mod amt tan drainage, no odor or fluctuance Periwound: pink dry scar tissue surrounding the open wound Dressing procedure/placement/frequency: Change dressing Q Tues/Fri/Sun. Float heels to reduce pressure. Continue present plan of care as was ordered by the outpatient wound care center.  Topical treatment orders provided for bedside nurses to perform: Apply piece of Aquacel to wound, then cover with 4X4 and foam heel cup. (Do not throw away) Then cover with kerlex and coban.  Moisten previous dressing with NS to remove.  Pt states he will resume follow-up with the outpatient wound care center after discharge.  Please re-consult if further assistance is needed.  Thank-you,  Julien Girt MSN, [place], [place], [place], [place]

## 2021-08-19 NOTE — Progress Notes (Addendum)
PROGRESS NOTE    [name]  YYT:[id number] DOB: [birth year] DOA: 08/17/2021 PCP: Idelle Crouch, MD   Brief Narrative: Taken from prior notes. 75 year old gentleman with past medical history significant for anemia, CHF (last echo documented demonstrated an EF of 60-65% in 2018), CAD, DDD, DM II (HbA1c of 7.3), GERD, Hyperlipidemia, Hypertension, Neuropathy, PAF, Stroke, and ascvd of the thoracic aorta presented to ED with sudden onset dyspnea.  Found to have A. fib with RVR.  Patient has an history of paroxysmal atrial fibrillation and used to take amiodarone which has been stopped remotely. Cardiology was consulted and he was started on amiodarone infusion.  Also developed worsening anemia, no obvious bleeding.  INR therapeutic.  CT abdomen and pelvis was done today, 08/19/2021 and it was without any acute abnormality or signs of hemorrhage. Per wife similar presentation approximately 1 to 2 years ago when he required blood transfusions. Ordered 2 units of PRBC.  Subjective: Patient was seen and examined today.  Wife at bedside.  According to her patient has similar presentation with dropping hemoglobin without any obvious bleeding requiring blood transfusions approximately 1 to 2 years ago.  Denies any recent illnesses.  Assessment & Plan:   Active Problems:   Anemia   Coronary artery disease   Diabetes (HCC)   S/P AVR (aortic valve replacement)   Atrial fibrillation with rapid ventricular response (HCC)  A. fib with RVR.  Rate currently well controlled and amiodarone infusion was discontinued. -Continue with amiodarone-being managed by cardiology. -Continue with Coumadin per pharmacy -Continue with metoprolol  Anemia.  Hemoglobin dropped to 6.8 without any obvious bleeding.  CT abdomen pelvis was done to rule out any retroperitoneal bleed and it was negative for any acute hemorrhage. FOBT ordered-pending.  Patient has an similar history couple of years ago when he requires  blood transfusions.  Patient and wife denies any recent viral illnesses.  Anemia panel within normal limit. -Check blood smear, LDH and haptoglobin -Transfused with 2 unit of PRBC -Monitor hemoglobin -We will get benefit from outpatient hematology evaluation.  Coronary artery disease.  No chest pain. -Continue home dose of aspirin, Lopressor and statin.  History of depression.  No acute concern. -Continue with home meds  History of hyerthyroidism. -Continue home dose of methimazole  Class II obesity. Estimated body mass index is 37.55 kg/m as calculated from the following:   Height as of this encounter: 5\' 10"  (1.778 m).   Weight as of this encounter: 118.7 kg.   Vitamin D deficiency. -Vitamin D supplement  Objective: Vitals:   08/19/21 1345 08/19/21 1408 08/19/21 1446 08/19/21 1739  BP: (!) 112/48 (!) 90/57 (!) 92/54 (!) 99/42  Pulse: 93 86  88  Resp: 18 16  16   Temp: 98.5 F (36.9 C) 98.7 F (37.1 C)  98 F (36.7 C)  TempSrc: Oral Oral  Oral  SpO2: 94% 97%  96%  Weight:      Height:        Intake/Output Summary (Last 24 hours) at 08/19/2021 1752 Last data filed at 08/19/2021 1730 Gross per 24 hour  Intake 1419.62 ml  Output 700 ml  Net 719.62 ml   Filed Weights   08/17/21 0827 08/17/21 1700  Weight: 122 kg 118.7 kg    Examination:  General exam: Appears calm and comfortable  Respiratory system: Clear to auscultation. Respiratory effort normal. Cardiovascular system: S1 & S2 heard, RRR.  Gastrointestinal system: Soft, nontender, nondistended, bowel sounds positive. Central nervous system: Alert and oriented. No  focal neurological deficits. Extremities: No edema, no cyanosis, pulses intact and symmetrical. Psychiatry: Judgement and insight appear normal.     DVT prophylaxis: Coumadin Code Status: Full Family Communication: Discussed with wife at bedside Disposition Plan:  Status is: Inpatient  Remains inpatient appropriate because: Severity of  illness   Level of care: Progressive Cardiac  All the records are reviewed and case discussed with Care Management/Social Worker. Management plans discussed with the patient, nursing and they are in agreement.  Consultants:  Cardiology  Procedures:  Antimicrobials:   Data Reviewed: I have personally reviewed following labs and imaging studies  CBC: Recent Labs  Lab 08/17/21 0829 08/18/21 0649 08/19/21 0503  WBC 10.8* 11.7* 14.0*  HGB 11.7* 8.4* 6.8*  HCT 35.4* 24.7* 21.8*  MCV 89.2 87.9 89.7  PLT 302 231 250   Basic Metabolic Panel: Recent Labs  Lab 08/17/21 0829 08/18/21 0649 08/18/21 1432 08/19/21 0503  NA 137 136  --  137  K 4.6 3.9  --  4.0  CL 101 104  --  105  CO2 26 23  --  28  GLUCOSE 204* 254*  --  180*  BUN 52* 61*  --  40*  CREATININE 1.04 0.82  --  0.93  CALCIUM 8.4* 8.0*  --  8.3*  MG  --   --  2.2 2.3  PHOS  --   --  3.3 2.9   GFR: Estimated Creatinine Clearance: 88.6 mL/min (by C-G formula based on SCr of 0.93 mg/dL). Liver Function Tests: Recent Labs  Lab 08/18/21 0649  AST 18  ALT 15  ALKPHOS 71  BILITOT 0.3  PROT 6.5  ALBUMIN 2.9*   No results for input(s): LIPASE, AMYLASE in the last 168 hours. No results for input(s): AMMONIA in the last 168 hours. Coagulation Profile: Recent Labs  Lab 08/17/21 1235 08/18/21 0649 08/19/21 0503  INR 1.8* 2.1* 2.7*   Cardiac Enzymes: No results for input(s): CKTOTAL, CKMB, CKMBINDEX, TROPONINI in the last 168 hours. BNP (last 3 results) No results for input(s): PROBNP in the last 8760 hours. HbA1C: Recent Labs    08/17/21 0829  HGBA1C 7.3*   CBG: Recent Labs  Lab 08/18/21 1629 08/18/21 2213 08/19/21 0736 08/19/21 1137 08/19/21 1556  GLUCAP 243* 150* 169* 181* 181*   Lipid Profile: No results for input(s): CHOL, HDL, LDLCALC, TRIG, CHOLHDL, LDLDIRECT in the last 72 hours. Thyroid Function Tests: Recent Labs    08/18/21 1432  TSH 3.560  T4TOTAL 6.1   Anemia  Panel: Recent Labs    08/18/21 1432  VITAMINB12 205  FOLATE 18.9  TIBC 358  IRON 136   Sepsis Labs: No results for input(s): PROCALCITON, LATICACIDVEN in the last 168 hours.  Recent Results (from the past 240 hour(s))  Resp Panel by RT-PCR (Flu A&B, Covid) Nasopharyngeal Swab     Status: None   Collection Time: 08/17/21 12:35 PM   Specimen: Nasopharyngeal Swab; Nasopharyngeal(NP) swabs in vial transport medium  Result Value Ref Range Status   SARS Coronavirus 2 by RT PCR NEGATIVE NEGATIVE Final    Comment: (NOTE) SARS-CoV-2 target nucleic acids are NOT DETECTED.  The SARS-CoV-2 RNA is generally detectable in upper respiratory specimens during the acute phase of infection. The lowest concentration of SARS-CoV-2 viral copies this assay can detect is 138 copies/mL. A negative result does not preclude SARS-Cov-2 infection and should not be used as the sole basis for treatment or other patient management decisions. A negative result may occur with  improper specimen  collection/handling, submission of specimen other than nasopharyngeal swab, presence of viral mutation(s) within the areas targeted by this assay, and inadequate number of viral copies(<138 copies/mL). A negative result must be combined with clinical observations, patient history, and epidemiological information. The expected result is Negative.  Fact Sheet for Patients:  EntrepreneurPulse.com.au  Fact Sheet for Healthcare Providers:  IncredibleEmployment.be  This test is no t yet approved or cleared by the [place] FDA and  has been authorized for detection and/or diagnosis of SARS-CoV-2 by FDA under an Emergency Use Authorization (EUA). This EUA will remain  in effect (meaning this test can be used) for the duration of the COVID-19 declaration under Section 564(b)(1) of the Act, 21 U.S.C.section 360bbb-3(b)(1), unless the authorization is terminated  or revoked sooner.        Influenza A by PCR NEGATIVE NEGATIVE Final   Influenza B by PCR NEGATIVE NEGATIVE Final    Comment: (NOTE) The Xpert Xpress SARS-CoV-2/FLU/RSV plus assay is intended as an aid in the diagnosis of influenza from Nasopharyngeal swab specimens and should not be used as a sole basis for treatment. Nasal washings and aspirates are unacceptable for Xpert Xpress SARS-CoV-2/FLU/RSV testing.  Fact Sheet for Patients: EntrepreneurPulse.com.au  Fact Sheet for Healthcare Providers: IncredibleEmployment.be  This test is not yet approved or cleared by the [place] FDA and has been authorized for detection and/or diagnosis of SARS-CoV-2 by FDA under an Emergency Use Authorization (EUA). This EUA will remain in effect (meaning this test can be used) for the duration of the COVID-19 declaration under Section 564(b)(1) of the Act, 21 U.S.C. section 360bbb-3(b)(1), unless the authorization is terminated or revoked.  Performed at [facility], [address] [place], [place] [address]      Radiology Studies: CT ABDOMEN PELVIS WO CONTRAST  Result Date: 08/19/2021 CLINICAL DATA:  HGB dropped without any obvious bleeding, requiring blood transfusion, want to rule out any retroperitoneal bleed. EXAM: CT ABDOMEN AND PELVIS WITHOUT CONTRAST TECHNIQUE: Multidetector CT imaging of the abdomen and pelvis was performed following the standard protocol without IV contrast. COMPARISON:  Contrast enhanced CT 12/28/2018 FINDINGS: Lower chest: Prosthetic aortic valve. Decreased density of the blood pool consistent with anemia. There is mild elevation of left hemidiaphragm. No acute airspace disease or pleural effusion. Bilateral gynecomastia. Hepatobiliary: Mild hepatic steatosis without focal hepatic lesion on this unenhanced exam. Gallbladder partially distended, no calcified stone. No biliary dilatation. Pancreas: Mild fatty atrophy.  No ductal dilatation or  inflammation. Spleen: Normal in size without focal abnormality. Adrenals/Urinary Tract: Normal adrenal glands. No hydronephrosis or renal calculi. There is faint bilateral perinephric edema that is chronic and similar to prior exam. The previous cyst in the lower left kidney is not well delineated on the current exam. Physiologically distended urinary bladder. No bladder wall thickening. Stomach/Bowel: Small hiatal hernia. Stomach is decompressed. There is a small duodenal diverticulum. Occasional fecalization of distal small bowel contents. No small bowel inflammation or obstruction. Normal appendix. Moderate volume of stool throughout the colon. There is no colonic wall thickening or pericolonic edema. Vascular/Lymphatic: Moderate aortic atherosclerosis. No aortic aneurysm. There is no retroperitoneal or portal venous gas. There is no enlargement in the trip air today and suggest retroperitoneal hemorrhage. There is no enlargement of the abdominal wall musculature to suggest rectus sheath hematoma. No hemoperitoneum or free fluid. No enlarged lymph nodes in the abdomen or pelvis. Reproductive: Grossly unremarkable prostate gland, partially obscured by streak artifact from right hip arthroplasty. Other: No free fluid or free  air. Tiny fat containing umbilical hernia. There is fat within the left inguinal canal. No evidence of abdominopelvic hemorrhage or hematoma. No body wall abnormality. Musculoskeletal: Right hip arthroplasty. Posterior lumbar fusion L4 through S1. 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 No acute osseous abnormalities are seen. Remote left eleventh rib fracture. IMPRESSION: 1. No evidence of abdominopelvic hemorrhage or acute abnormality. No retroperitoneal or rectus sheath hematoma. 2. Moderate colonic stool burden with fecalization of distal small bowel contents, suggesting slow transit. No bowel obstruction or inflammation. 3. Mild hepatic steatosis. Aortic  Atherosclerosis (ICD10-I70.0). Electronically Signed   By: Keith Rake M.D.   On: 08/19/2021 17:09    Scheduled Meds:  amiodarone  200 mg Oral BID   aspirin EC  81 mg Oral Daily   cyanocobalamin  1,000 mcg Intramuscular Q30 days   DULoxetine  30 mg Oral QPM   ferrous sulfate  325 mg Oral BID WC   gabapentin  600 mg Oral BID   insulin aspart  0-5 Units Subcutaneous QHS   insulin aspart  0-9 Units Subcutaneous TID WC   methadone  5 mg Oral BID   methimazole  20 mg Oral Daily   metoprolol tartrate  25 mg Oral BID   multivitamin with minerals  1 tablet Oral Daily   pravastatin  40 mg Oral QPM   torsemide  20 mg Oral Daily   Warfarin - Pharmacist Dosing Inpatient   Does not apply q1600   Continuous Infusions:   LOS: 1 day   Time spent: 40 minutes. More than 50% of the time was spent in counseling/coordination of care  Lorella Nimrod, MD [facility]  If 7PM-7AM, please contact night-coverage [email or web address]  08/19/2021, 5:52 PM   This record has been created using Systems analyst. Errors have been sought and corrected,but may not always be located. Such creation errors do not reflect on the standard of care.

## 2021-08-20 DIAGNOSIS — D649 Anemia, unspecified: Secondary | ICD-10-CM

## 2021-08-20 LAB — BASIC METABOLIC PANEL
Anion gap: 6 (ref 5–15)
BUN: 26 mg/dL — ABNORMAL HIGH (ref 8–23)
CO2: 28 mmol/L (ref 22–32)
Calcium: 7.7 mg/dL — ABNORMAL LOW (ref 8.9–10.3)
Chloride: 103 mmol/L (ref 98–111)
Creatinine, Ser: 0.84 mg/dL (ref 0.61–1.24)
GFR, Estimated: >60 mL/min (ref >60)
Glucose, Bld: 161 mg/dL — ABNORMAL HIGH (ref 70–99)
Potassium: 3.4 mmol/L — ABNORMAL LOW (ref 3.5–5.1)
Sodium: 137 mmol/L (ref 135–145)

## 2021-08-20 LAB — CBC
HCT: 22.2 % — ABNORMAL LOW (ref 39.0–52.0)
Hemoglobin: 7.6 g/dL — ABNORMAL LOW (ref 13.0–17.0)
MCH: 30.9 pg (ref 26.0–34.0)
MCHC: 34.2 g/dL (ref 30.0–36.0)
MCV: 90.2 fL (ref 80.0–100.0)
Platelets: 214 10*3/uL (ref 150–400)
RBC: 2.46 MIL/uL — ABNORMAL LOW (ref 4.22–5.81)
RDW: 15.3 % (ref 11.5–15.5)
WBC: 10.6 10*3/uL — ABNORMAL HIGH (ref 4.0–10.5)
nRBC: 1.1 % — ABNORMAL HIGH (ref 0.0–0.2)

## 2021-08-20 LAB — PATHOLOGIST SMEAR REVIEW

## 2021-08-20 LAB — MAGNESIUM: Magnesium: 2.1 mg/dL (ref 1.7–2.4)

## 2021-08-20 LAB — GLUCOSE, CAPILLARY
Glucose-Capillary: 169 mg/dL — ABNORMAL HIGH (ref 70–99)
Glucose-Capillary: 226 mg/dL — ABNORMAL HIGH (ref 70–99)
Glucose-Capillary: 124 mg/dL — ABNORMAL HIGH (ref 70–99)
Glucose-Capillary: 139 mg/dL — ABNORMAL HIGH (ref 70–99)

## 2021-08-20 LAB — PROTIME-INR
INR: 2.3 — ABNORMAL HIGH (ref 0.8–1.2)
Prothrombin Time: 25.6 seconds — ABNORMAL HIGH (ref 11.4–15.2)

## 2021-08-20 LAB — PREPARE RBC (CROSSMATCH)

## 2021-08-20 LAB — PHOSPHORUS: Phosphorus: 2.9 mg/dL (ref 2.5–4.6)

## 2021-08-20 MED ORDER — WARFARIN SODIUM 5 MG PO TABS
5.0000 mg | ORAL_TABLET | Freq: Once | ORAL | Status: AC
  Administered 2021-08-20: 5 mg via ORAL
  Filled 2021-08-20: qty 1

## 2021-08-20 MED ORDER — POTASSIUM CHLORIDE CRYS ER 20 MEQ PO TBCR
40.0000 meq | EXTENDED_RELEASE_TABLET | Freq: Once | ORAL | Status: AC
  Administered 2021-08-20: 40 meq via ORAL
  Filled 2021-08-20: qty 2

## 2021-08-20 NOTE — Consult Note (Signed)
[place]  [phone] [phone]  ID: RIZWAN KUYPER OB: 10/09/1946  MR#: [id number]  [id number]#:[id number]  Patient Care Team: Idelle Crouch, MD as PCP - General (Internal Medicine)  CHIEF COMPLAINT: Anemia, unspecified.  INTERVAL HISTORY: Patient is a 75 year old male admitted for atrial fibrillation with rapid ventricular response who has a history of repeated episodes of anemia requiring blood transfusion.  His hemoglobin decreased from 11.7-6.8 this admission with no obvious bleeding or etiology.  He has chronic weakness and fatigue, but otherwise feels well.  He has no neurologic complaints.  He denies any recent fevers or illnesses.  He has a fair appetite, but denies weight loss.  He has no current chest pain, shortness of breath, cough, or hemoptysis.  He denies any nausea, vomiting, constipation, or diarrhea.  He has no urinary complaints.  Patient offers no further specific complaints today.  REVIEW OF SYSTEMS:   Review of Systems  Constitutional:  Positive for malaise/fatigue. Negative for fever and weight loss.  Respiratory: Negative.  Negative for cough, hemoptysis and shortness of breath.   Cardiovascular: Negative.  Negative for chest pain and leg swelling.  Gastrointestinal: Negative.  Negative for abdominal pain, blood in stool and melena.  Genitourinary: Negative.  Negative for hematuria.  Musculoskeletal:  Negative for back pain.  Skin: Negative.  Negative for rash.  Neurological:  Positive for weakness. Negative for dizziness, focal weakness and headaches.  Psychiatric/Behavioral: Negative.  The patient is not nervous/anxious.    As per HPI. Otherwise, a complete review of systems is negative.  PAST MEDICAL HISTORY: Past Medical History:  Diagnosis Date   Anemia    CHF (congestive heart failure) (HCC)    Chronic pain    Coronary artery disease    DDD (degenerative disc disease), hip    Diabetes mellitus without  complication (HCC)    DJD (degenerative joint disease)    Dysrhythmia    GERD (gastroesophageal reflux disease)    Hyperlipemia    Hypertension    Insomnia    Neuropathy    Paroxysmal A-fib ([place])    Peripheral neuropathy    Stroke [facility])    Thoracic aortic atherosclerosis ([place])     PAST SURGICAL HISTORY: Past Surgical History:  Procedure Laterality Date   BACK SURGERY     CARDIAC SURGERY     CARDIAC VALVE REPLACEMENT     CARDIOVERSION N/A 05/04/2017   Procedure: CARDIOVERSION;  Surgeon: Corey Skains, MD;  Location: [facility] ORS;  Service: Cardiovascular;  Laterality: N/A;   carpel tunnel release     CATARACT EXTRACTION W/ INTRAOCULAR LENS IMPLANT Bilateral    COLONOSCOPY     COLONOSCOPY WITH ESOPHAGOGASTRODUODENOSCOPY (EGD)     COLONOSCOPY WITH PROPOFOL N/A 05/16/2018   Procedure: COLONOSCOPY WITH PROPOFOL;  Surgeon: Manya Silvas, MD;  Location[facility] ENDOSCOPY;  Service: Endoscopy;  Laterality: N/A;   COLONOSCOPY WITH PROPOFOL N/A 08/06/2021   Procedure: COLONOSCOPY WITH PROPOFOL;  Surgeon: Toledo, Benay Pike, MD;  Location: [facility] ENDOSCOPY;  Service: Gastroenterology;  Laterality: N/A;   ESOPHAGOGASTRODUODENOSCOPY (EGD) WITH PROPOFOL N/A 05/16/2018   Procedure: ESOPHAGOGASTRODUODENOSCOPY (EGD) WITH PROPOFOL;  Surgeon: Manya Silvas, MD;  Location: [facility] ENDOSCOPY;  Service: Endoscopy;  Laterality: N/A;   JOINT REPLACEMENT     TEE WITHOUT CARDIOVERSION N/A 10/08/2016   Procedure: TRANSESOPHAGEAL ECHOCARDIOGRAM (TEE);  Surgeon: Teodoro Spray, MD;  Location: [facility] ORS;  Service: Cardiovascular;  Laterality: N/A;   TEE WITHOUT CARDIOVERSION N/A 11/23/2016   Procedure: TRANSESOPHAGEAL ECHOCARDIOGRAM (TEE);  Surgeon: Isaias Cowman, MD;  Location: [facility] ORS;  Service: Cardiovascular;  Laterality: N/A;   TONSILLECTOMY     VSD REPAIR      FAMILY HISTORY: Family History  Problem Relation Age of Onset   Stroke Mother    Heart attack Mother    Heart attack Father      ADVANCED DIRECTIVES (Y/N):  '@ADVDIR' @  HEALTH MAINTENANCE: Social History   Tobacco Use   Smoking status: Former    Types: Cigarettes    Quit date: 07/09/1996    Years since quitting: 25.1   Smokeless tobacco: Never  Vaping Use   Vaping Use: Never used  Substance Use Topics   Alcohol use: Yes    Alcohol/week: 3.0 standard drinks    Types: 2 Cans of beer, 1 Shots of liquor per week   Drug use: No     Colonoscopy:  PAP:  Bone density:  Lipid panel:  Allergies  Allergen Reactions   Acetaminophen-Codeine Other (See Comments)    Other reaction(s): Headache Other reaction(s): Headache   Demeclocycline Hives   Tetracycline Hives   Tetracyclines & Related Hives    Current Facility-Administered Medications  Medication Dose Route Frequency Provider Last Rate Last Admin   acetaminophen (TYLENOL) tablet 650 mg  650 mg Oral Q6H PRN Swayze, Ava, DO       Or   acetaminophen (TYLENOL) suppository 650 mg  650 mg Rectal Q6H PRN Swayze, Ava, DO       amiodarone (PACERONE) tablet 200 mg  200 mg Oral BID Jettie Booze, PA-C   200 mg at 08/20/21 8177   aspirin EC tablet 81 mg  81 mg Oral Daily Swayze, Ava, DO   81 mg at 08/20/21 0802   bisacodyl (DULCOLAX) suppository 10 mg  10 mg Rectal Daily PRN Swayze, Ava, DO       cyanocobalamin ((VITAMIN B-12)) injection 1,000 mcg  1,000 mcg Intramuscular Q30 days Swayze, Ava, DO       cyclobenzaprine (FLEXERIL) tablet 10 mg  10 mg Oral Daily PRN Swayze, Ava, DO       DULoxetine (CYMBALTA) DR capsule 30 mg  30 mg Oral QPM Swayze, Ava, DO   30 mg at 08/20/21 1720   ferrous sulfate tablet 325 mg  325 mg Oral BID WC Swayze, Ava, DO   325 mg at 08/20/21 1720   gabapentin (NEURONTIN) tablet 600 mg  600 mg Oral BID Swayze, Ava, DO   600 mg at 08/20/21 0802   insulin aspart (novoLOG) injection 0-5 Units  0-5 Units Subcutaneous QHS Val Riles, MD       insulin aspart (novoLOG) injection 0-9 Units  0-9 Units Subcutaneous TID WC Val Riles, MD   1  Units at 08/20/21 1721   methadone (DOLOPHINE) tablet 5 mg  5 mg Oral BID Swayze, Ava, DO   5 mg at 08/20/21 0800   methimazole (TAPAZOLE) tablet 20 mg  20 mg Oral Daily Swayze, Ava, DO   20 mg at 08/20/21 0807   metoprolol tartrate (LOPRESSOR) injection 5 mg  5 mg Intravenous Q5 min PRN Blake Divine, MD   5 mg at 08/17/21 1254   metoprolol tartrate (LOPRESSOR) tablet 25 mg  25 mg Oral BID Swayze, Ava, DO   25 mg at 08/20/21 0800   multivitamin with minerals tablet 1 tablet  1 tablet Oral Daily Swayze, Ava, DO   1 tablet at 08/20/21 0803   ondansetron (ZOFRAN) tablet 4 mg  4 mg Oral Q6H PRN Swayze, Ava, DO  Or   ondansetron (ZOFRAN) injection 4 mg  4 mg Intravenous Q6H PRN Swayze, Ava, DO   4 mg at 08/18/21 1100   pravastatin (PRAVACHOL) tablet 40 mg  40 mg Oral QPM Swayze, Ava, DO   40 mg at 08/20/21 1720   senna-docusate (Senokot-S) tablet 1 tablet  1 tablet Oral QHS PRN Swayze, Ava, DO       torsemide (DEMADEX) tablet 20 mg  20 mg Oral Daily Swayze, Ava, DO   20 mg at 08/20/21 0802   Warfarin - Pharmacist Dosing Inpatient   Does not apply q1600 Wynelle Cleveland, [facility]   Given at 08/20/21 1721    OBJECTIVE: Vitals:   08/20/21 1545 08/20/21 1633  BP: (!) 113/58 102/62  Pulse: 67 74  Resp: 18 18  Temp: 98.3 F (36.8 C) 98.4 F (36.9 C)  SpO2: 100% 99%     Body mass index is 37.55 kg/m.    ECOG FS:1 - Symptomatic but completely ambulatory  General: Well-developed, well-nourished, no acute distress. Eyes: Pink conjunctiva, anicteric sclera. HEENT: Normocephalic, moist mucous membranes. Lungs: No audible wheezing or coughing. Heart: Regular rate and rhythm. Abdomen: Soft, nontender, no obvious distention. Musculoskeletal: No edema, cyanosis, or clubbing. Neuro: Alert, answering all questions appropriately. Cranial nerves grossly intact. Skin: No rashes or petechiae noted. Psych: Normal affect. Lymphatics: No cervical, calvicular, axillary or inguinal LAD.   LAB  RESULTS:  Lab Results  Component Value Date   NA 137 08/20/2021   K 3.4 (L) 08/20/2021   CL 103 08/20/2021   CO2 28 08/20/2021   GLUCOSE 161 (H) 08/20/2021   BUN 26 (H) 08/20/2021   CREATININE 0.84 08/20/2021   CALCIUM 7.7 (L) 08/20/2021   PROT 6.5 08/18/2021   ALBUMIN 2.9 (L) 08/18/2021   AST 18 08/18/2021   ALT 15 08/18/2021   ALKPHOS 71 08/18/2021   BILITOT 0.3 08/18/2021   GFRNONAA >60 08/20/2021   GFRAA >60 12/28/2018    Lab Results  Component Value Date   WBC 10.6 (H) 08/20/2021   NEUTROABS 5.4 12/18/2016   HGB 7.6 (L) 08/20/2021   HCT 22.2 (L) 08/20/2021   MCV 90.2 08/20/2021   PLT 214 08/20/2021   Lab Results  Component Value Date   IRON 136 08/18/2021   TIBC 358 08/18/2021   IRONPCTSAT 38 08/18/2021   Lab Results  Component Value Date   FERRITIN 6 (L) 11/03/2017     STUDIES: CT ABDOMEN PELVIS WO CONTRAST  Result Date: 08/19/2021 CLINICAL DATA:  HGB dropped without any obvious bleeding, requiring blood transfusion, want to rule out any retroperitoneal bleed. EXAM: CT ABDOMEN AND PELVIS WITHOUT CONTRAST TECHNIQUE: Multidetector CT imaging of the abdomen and pelvis was performed following the standard protocol without IV contrast. COMPARISON:  Contrast enhanced CT 12/28/2018 FINDINGS: Lower chest: Prosthetic aortic valve. Decreased density of the blood pool consistent with anemia. There is mild elevation of left hemidiaphragm. No acute airspace disease or pleural effusion. Bilateral gynecomastia. Hepatobiliary: Mild hepatic steatosis without focal hepatic lesion on this unenhanced exam. Gallbladder partially distended, no calcified stone. No biliary dilatation. Pancreas: Mild fatty atrophy.  No ductal dilatation or inflammation. Spleen: Normal in size without focal abnormality. Adrenals/Urinary Tract: Normal adrenal glands. No hydronephrosis or renal calculi. There is faint bilateral perinephric edema that is chronic and similar to prior exam. The previous cyst in  the lower left kidney is not well delineated on the current exam. Physiologically distended urinary bladder. No bladder wall thickening. Stomach/Bowel: Small hiatal hernia. Stomach is  decompressed. There is a small duodenal diverticulum. Occasional fecalization of distal small bowel contents. No small bowel inflammation or obstruction. Normal appendix. Moderate volume of stool throughout the colon. There is no colonic wall thickening or pericolonic edema. Vascular/Lymphatic: Moderate aortic atherosclerosis. No aortic aneurysm. There is no retroperitoneal or portal venous gas. There is no enlargement in the trip air today and suggest retroperitoneal hemorrhage. There is no enlargement of the abdominal wall musculature to suggest rectus sheath hematoma. No hemoperitoneum or free fluid. No enlarged lymph nodes in the abdomen or pelvis. Reproductive: Grossly unremarkable prostate gland, partially obscured by streak artifact from right hip arthroplasty. Other: No free fluid or free air. Tiny fat containing umbilical hernia. There is fat within the left inguinal canal. No evidence of abdominopelvic hemorrhage or hematoma. No body wall abnormality. Musculoskeletal: Right hip arthroplasty. Posterior lumbar fusion L4 through S1. 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 No acute osseous abnormalities are seen. Remote left eleventh rib fracture. IMPRESSION: 1. No evidence of abdominopelvic hemorrhage or acute abnormality. No retroperitoneal or rectus sheath hematoma. 2. Moderate colonic stool burden with fecalization of distal small bowel contents, suggesting slow transit. No bowel obstruction or inflammation. 3. Mild hepatic steatosis. Aortic Atherosclerosis (ICD10-I70.0). Electronically Signed   By: Keith Rake M.D.   On: 08/19/2021 17:09   DG Chest 2 View  Result Date: 08/17/2021 CLINICAL DATA:  SOB EXAM: CHEST - 2 VIEW COMPARISON:  None. FINDINGS: Status post prosthetic heart  valve. Median sternotomy. Anterior cervical fusion hardware. The heart size is within normal limits. No pleural effusion. No pneumothorax. No mass or consolidation. No acute osseous abnormality. Calcifications of the aortic arch. IMPRESSION: No acute findings in the chest. Electronically Signed   By: Albin Felling M.D.   On: 08/17/2021 09:15    ASSESSMENT: Anemia, unspecified.  PLAN:    Anemia, unspecified: Unclear etiology.  Iron stores, B12, and folate are all within normal limits.  There is no evidence of hemolysis.  Pathology review of blood smear was unrevealing.  Previously, patient had inappropriately normal reticulocyte count.  Repeat results are pending at time of dictation.  Given no obvious peripheral etiology including blood loss, may have an underlying bone marrow disorder which would require a bone marrow biopsy to diagnose.  This was discussed with the patient and he agreed to pursue the procedure after discharge.  Upon discharge, will arrange bone marrow biopsy and follow-up in the cancer center 7 to 10 days later to discuss the results.  Continue to monitor CBC and transfuse when indicated.  Appreciate consult, will follow.  Lloyd Huger, MD   08/20/2021 6:15 PM

## 2021-08-20 NOTE — Progress Notes (Signed)
PROGRESS NOTE    [name]  PXT:[id number] DOB: June 17, 1946 DOA: 08/17/2021 PCP: Idelle Crouch, MD   Brief Narrative: Taken from prior notes. 75 year old gentleman with past medical history significant for anemia, CHF (last echo documented demonstrated an EF of 60-65% in 2018), CAD, DDD, DM II (HbA1c of 7.3), GERD, Hyperlipidemia, Hypertension, Neuropathy, PAF, Stroke, and ascvd of the thoracic aorta presented to ED with sudden onset dyspnea.  Found to have A. fib with RVR.  Patient has an history of paroxysmal atrial fibrillation and used to take amiodarone which has been stopped remotely. Cardiology was consulted and he was started on amiodarone infusion.  Also developed worsening anemia, no obvious bleeding.  INR therapeutic.  CT abdomen and pelvis was done today, 08/19/2021 and it was without any acute abnormality or signs of hemorrhage. Per wife similar presentation approximately 1 to 2 years ago when he required blood transfusions. Ordered 2 units of PRBC.  11/2; patient only received 1 unit as blood bank did not dispense a second unit because of hemoglobin of 8 after receiving 1.  Hemoglobin dropped to 7.6 today, imaging without any obvious source of bleeding.  Recent EGD and colonoscopy within normal limits. Ordered to receive second unit and hematology was consulted.  Subjective: Patient was feeling much improved when seen during morning rounds.  No new complaints.  Assessment & Plan:   Active Problems:   Anemia   Coronary artery disease   Diabetes (HCC)   S/P AVR (aortic valve replacement)   Atrial fibrillation with RVR (HCC)   Dyspnea on exertion   Generalized weakness  A. fib with RVR.  Rate currently well controlled and amiodarone infusion was discontinued. -Continue with amiodarone-being managed by cardiology. -Continue with Coumadin per pharmacy -Continue with metoprolol  Anemia.  Hemoglobin dropped to 7.6 after improving to 8 after getting 1 unit without  any obvious bleeding.  CT abdomen pelvis was done to rule out any retroperitoneal bleed and it was negative for any acute hemorrhage. FOBT ordered-pending.  Patient has an similar history couple of years ago when he requires blood transfusions.  Patient and wife denies any recent viral illnesses.  Anemia panel within normal limit. -Check blood smear, LDH and haptoglobin, smear and LDL without any significant abnormality, haptoglobin still pending -Transfuse second unit  of PRBC -Monitor hemoglobin -Hematology consult-Dr. Grayland Ormond was notified.  Coronary artery disease.  No chest pain. -Continue home dose of aspirin, Lopressor and statin.  History of depression.  No acute concern. -Continue with home meds  History of hyerthyroidism. -Continue home dose of methimazole  Class II obesity. Estimated body mass index is 37.55 kg/m as calculated from the following:   Height as of this encounter: 5\' 10"  (1.778 m).   Weight as of this encounter: 118.7 kg.   Vitamin D deficiency. -Vitamin D supplement  Objective: Vitals:   08/20/21 0727 08/20/21 1119 08/20/21 1545 08/20/21 1633  BP: 114/64 92/64 (!) 113/58 102/62  Pulse: (!) 106 73 67 74  Resp:   18 18  Temp: 98.3 F (36.8 C) 98.3 F (36.8 C) 98.3 F (36.8 C) 98.4 F (36.9 C)  TempSrc: Oral Oral Oral Oral  SpO2: (!) 88% 96% 100% 99%  Weight:      Height:        Intake/Output Summary (Last 24 hours) at 08/20/2021 1652 Last data filed at 08/20/2021 1010 Gross per 24 hour  Intake 1040 ml  Output 550 ml  Net 490 ml    [employer]  08/17/21 0827 08/17/21 1700  Weight: 122 kg 118.7 kg    Examination:  General.  Well-developed gentleman,in no acute distress. Pulmonary.  Lungs clear bilaterally, normal respiratory effort. CV.  Regular rate and rhythm, no JVD, rub or murmur. Abdomen.  Soft, nontender, nondistended, BS positive. CNS.  Alert and oriented x3.  No focal neurologic deficit. Extremities.  No edema, no cyanosis,  pulses intact and symmetrical. Psychiatry.  Judgment and insight appears normal.    DVT prophylaxis: Coumadin Code Status: Full Family Communication: Discussed with patient Disposition Plan:  Status is: Inpatient  Remains inpatient appropriate because: Severity of illness   Level of care: Progressive Cardiac  All the records are reviewed and case discussed with Care Management/Social Worker. Management plans discussed with the patient, nursing and they are in agreement.  Consultants:  Cardiology Hematology  Procedures:  Antimicrobials:   Data Reviewed: I have personally reviewed following labs and imaging studies  CBC: Recent Labs  Lab 08/17/21 0829 08/18/21 0649 08/19/21 0503 08/19/21 1828 08/20/21 0552  WBC 10.8* 11.7* 14.0*  --  10.6*  HGB 11.7* 8.4* 6.8* 8.0* 7.6*  HCT 35.4* 24.7* 21.8* 23.6* 22.2*  MCV 89.2 87.9 89.7  --  90.2  PLT 302 231 263  --  865    Basic Metabolic Panel: Recent Labs  Lab 08/17/21 0829 08/18/21 0649 08/18/21 1432 08/19/21 0503 08/20/21 0552  NA 137 136  --  137 137  K 4.6 3.9  --  4.0 3.4*  CL 101 104  --  105 103  CO2 26 23  --  28 28  GLUCOSE 204* 254*  --  180* 161*  BUN 52* 61*  --  40* 26*  CREATININE 1.04 0.82  --  0.93 0.84  CALCIUM 8.4* 8.0*  --  8.3* 7.7*  MG  --   --  2.2 2.3 2.1  PHOS  --   --  3.3 2.9 2.9    GFR: Estimated Creatinine Clearance: 98.1 mL/min (by C-G formula based on SCr of 0.84 mg/dL). Liver Function Tests: Recent Labs  Lab 08/18/21 0649  AST 18  ALT 15  ALKPHOS 71  BILITOT 0.3  PROT 6.5  ALBUMIN 2.9*    No results for input(s): LIPASE, AMYLASE in the last 168 hours. No results for input(s): AMMONIA in the last 168 hours. Coagulation Profile: Recent Labs  Lab 08/17/21 1235 08/18/21 0649 08/19/21 0503 08/20/21 0847  INR 1.8* 2.1* 2.7* 2.3*    Cardiac Enzymes: No results for input(s): CKTOTAL, CKMB, CKMBINDEX, TROPONINI in the last 168 hours. BNP (last 3 results) No results  for input(s): PROBNP in the last 8760 hours. HbA1C: No results for input(s): HGBA1C in the last 72 hours.  CBG: Recent Labs  Lab 08/19/21 1556 08/19/21 2131 08/20/21 0728 08/20/21 1121 08/20/21 1552  GLUCAP 181* 107* 169* 226* 124*    Lipid Profile: No results for input(s): CHOL, HDL, LDLCALC, TRIG, CHOLHDL, LDLDIRECT in the last 72 hours. Thyroid Function Tests: Recent Labs    08/18/21 1432  TSH 3.560  T4TOTAL 6.1    Anemia Panel: Recent Labs    08/18/21 1432  VITAMINB12 205  FOLATE 18.9  TIBC 358  IRON 136    Sepsis Labs: No results for input(s): PROCALCITON, LATICACIDVEN in the last 168 hours.  Recent Results (from the past 240 hour(s))  Resp Panel by RT-PCR (Flu A&B, Covid) Nasopharyngeal Swab     Status: None   Collection Time: 08/17/21 12:35 PM   Specimen: Nasopharyngeal Swab; Nasopharyngeal(NP) swabs  in vial transport medium  Result Value Ref Range Status   SARS Coronavirus 2 by RT PCR NEGATIVE NEGATIVE Final    Comment: (NOTE) SARS-CoV-2 target nucleic acids are NOT DETECTED.  The SARS-CoV-2 RNA is generally detectable in upper respiratory specimens during the acute phase of infection. The lowest concentration of SARS-CoV-2 viral copies this assay can detect is 138 copies/mL. A negative result does not preclude SARS-Cov-2 infection and should not be used as the sole basis for treatment or other patient management decisions. A negative result may occur with  improper specimen collection/handling, submission of specimen other than nasopharyngeal swab, presence of viral mutation(s) within the areas targeted by this assay, and inadequate number of viral copies(<138 copies/mL). A negative result must be combined with clinical observations, patient history, and epidemiological information. The expected result is Negative.  Fact Sheet for Patients:  EntrepreneurPulse.com.au  Fact Sheet for Healthcare Providers:   IncredibleEmployment.be  This test is no t yet approved or cleared by the [place] FDA and  has been authorized for detection and/or diagnosis of SARS-CoV-2 by FDA under an Emergency Use Authorization (EUA). This EUA will remain  in effect (meaning this test can be used) for the duration of the COVID-19 declaration under Section 564(b)(1) of the Act, 21 U.S.C.section 360bbb-3(b)(1), unless the authorization is terminated  or revoked sooner.       Influenza A by PCR NEGATIVE NEGATIVE Final   Influenza B by PCR NEGATIVE NEGATIVE Final    Comment: (NOTE) The Xpert Xpress SARS-CoV-2/FLU/RSV plus assay is intended as an aid in the diagnosis of influenza from Nasopharyngeal swab specimens and should not be used as a sole basis for treatment. Nasal washings and aspirates are unacceptable for Xpert Xpress SARS-CoV-2/FLU/RSV testing.  Fact Sheet for Patients: EntrepreneurPulse.com.au  Fact Sheet for Healthcare Providers: IncredibleEmployment.be  This test is not yet approved or cleared by the [place] FDA and has been authorized for detection and/or diagnosis of SARS-CoV-2 by FDA under an Emergency Use Authorization (EUA). This EUA will remain in effect (meaning this test can be used) for the duration of the COVID-19 declaration under Section 564(b)(1) of the Act, 21 U.S.C. section 360bbb-3(b)(1), unless the authorization is terminated or revoked.  Performed at [facility], [address] [place], [place] [address]       Radiology Studies: CT ABDOMEN PELVIS WO CONTRAST  Result Date: 08/19/2021 CLINICAL DATA:  HGB dropped without any obvious bleeding, requiring blood transfusion, want to rule out any retroperitoneal bleed. EXAM: CT ABDOMEN AND PELVIS WITHOUT CONTRAST TECHNIQUE: Multidetector CT imaging of the abdomen and pelvis was performed following the standard protocol without IV contrast. COMPARISON:   Contrast enhanced CT 12/28/2018 FINDINGS: Lower chest: Prosthetic aortic valve. Decreased density of the blood pool consistent with anemia. There is mild elevation of left hemidiaphragm. No acute airspace disease or pleural effusion. Bilateral gynecomastia. Hepatobiliary: Mild hepatic steatosis without focal hepatic lesion on this unenhanced exam. Gallbladder partially distended, no calcified stone. No biliary dilatation. Pancreas: Mild fatty atrophy.  No ductal dilatation or inflammation. Spleen: Normal in size without focal abnormality. Adrenals/Urinary Tract: Normal adrenal glands. No hydronephrosis or renal calculi. There is faint bilateral perinephric edema that is chronic and similar to prior exam. The previous cyst in the lower left kidney is not well delineated on the current exam. Physiologically distended urinary bladder. No bladder wall thickening. Stomach/Bowel: Small hiatal hernia. Stomach is decompressed. There is a small duodenal diverticulum. Occasional fecalization of distal small bowel contents. No small  bowel inflammation or obstruction. Normal appendix. Moderate volume of stool throughout the colon. There is no colonic wall thickening or pericolonic edema. Vascular/Lymphatic: Moderate aortic atherosclerosis. No aortic aneurysm. There is no retroperitoneal or portal venous gas. There is no enlargement in the trip air today and suggest retroperitoneal hemorrhage. There is no enlargement of the abdominal wall musculature to suggest rectus sheath hematoma. No hemoperitoneum or free fluid. No enlarged lymph nodes in the abdomen or pelvis. Reproductive: Grossly unremarkable prostate gland, partially obscured by streak artifact from right hip arthroplasty. Other: No free fluid or free air. Tiny fat containing umbilical hernia. There is fat within the left inguinal canal. No evidence of abdominopelvic hemorrhage or hematoma. No body wall abnormality. Musculoskeletal: Right hip arthroplasty. Posterior  lumbar fusion L4 through S1. 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 No acute osseous abnormalities are seen. Remote left eleventh rib fracture. IMPRESSION: 1. No evidence of abdominopelvic hemorrhage or acute abnormality. No retroperitoneal or rectus sheath hematoma. 2. Moderate colonic stool burden with fecalization of distal small bowel contents, suggesting slow transit. No bowel obstruction or inflammation. 3. Mild hepatic steatosis. Aortic Atherosclerosis (ICD10-I70.0). Electronically Signed   By: Keith Rake M.D.   On: 08/19/2021 17:09    Scheduled Meds:  amiodarone  200 mg Oral BID   aspirin EC  81 mg Oral Daily   cyanocobalamin  1,000 mcg Intramuscular Q30 days   DULoxetine  30 mg Oral QPM   ferrous sulfate  325 mg Oral BID WC   gabapentin  600 mg Oral BID   insulin aspart  0-5 Units Subcutaneous QHS   insulin aspart  0-9 Units Subcutaneous TID WC   methadone  5 mg Oral BID   methimazole  20 mg Oral Daily   metoprolol tartrate  25 mg Oral BID   multivitamin with minerals  1 tablet Oral Daily   pravastatin  40 mg Oral QPM   torsemide  20 mg Oral Daily   warfarin  5 mg Oral ONCE-1600   Warfarin - Pharmacist Dosing Inpatient   Does not apply q1600   Continuous Infusions:   LOS: 2 days   Time spent: 38 minutes. More than 50% of the time was spent in counseling/coordination of care  Lorella Nimrod, MD [facility]  If 7PM-7AM, please contact night-coverage [email or web address]  08/20/2021, 4:52 PM   This record has been created using Systems analyst. Errors have been sought and corrected,but may not always be located. Such creation errors do not reflect on the standard of care.

## 2021-08-20 NOTE — Progress Notes (Signed)
[facility] Cardiology [facility] Encounter Note  Patient: [name] / Admit Date: 08/17/2021 / Date of Encounter: 08/20/2021, 10:04 AM   Subjective: With known paroxysmal nonvalvular atrial fibrillation, diastolic dysfunction heart failure, coronary artery disease, diabetes, hypertension, hyperlipidemia, previous stroke, s/p aortic valve replacement with mechanical aortic valve with some associated lower extremity edema.  Patient does have a history of atrial fibrillation with rapid ventricular rate previously treated with amiodarone for which he had good control.  There had been some concerns that patient had some shortness of breath that may have been a side effect of amiodarone and therefore was discontinued as an outpatient.  Patient has done well for several months off of his amiodarone but now has a recurrence of atrial fibrillation with rapid ventricular rate with associated symptoms including weakness, fatigue.  EKG has shown atrial fibrillation with rapid ventricular rate of approximately 110 bpm.  CXR showed no evidence of CHF.  He has been started on amiodarone drip and has seen much improvement in his heart rate control currently in the 80s on telemetry.    11/1 He is tolerating this medication well however he still has concerns of shortness of breath on exertion, weakness, fatigue.  There is also been some concerns of significant anemia with a hemoglobin on admission of 11.7, currently 6.8.    11/2.  Overall patient feeling much better after infusion of packed red blood cells.  Heart rate much better controlled with medication management including amiodarone.  Atrial fibrillation with controlled ventricular rate by telemetry.  No evidence of congestive heart failure or anginal symptoms at this time.  The patient did have a colonoscopy recently for which the patient did not have any apparent bleeding source  Review of Systems: Positive for: palpitations, shortness of  breath Negative for: Vision change, hearing change, syncope, dizziness, nausea, vomiting,diarrhea, bloody stool, stomach pain, cough, congestion, diaphoresis, urinary frequency, urinary pain,skin lesions, skin rashes Others previously listed  Objective: Telemetry: atrial fibrillation with controlled rate on 80's Physical Exam: Blood pressure 114/64, pulse (!) 106, temperature 98.3 F (36.8 C), temperature source Oral, resp. rate 18, height 5\' 10"  (1.778 m), weight 118.7 kg, SpO2 (!) 88 %. Body mass index is 37.55 kg/m. General: Well developed, well nourished, in no acute distress. Head: Normocephalic, atraumatic, sclera non-icteric, no xanthomas, nares are without discharge. Neck: No apparent masses Lungs: Normal respirations with no wheezes, no rhonchi, no rales , no crackles   Heart: irregular rate and rhythm, normal S1 mechanical S2, 2+ aortic murmur, no rub, no gallop, PMI is normal size and placement, carotid upstroke normal without bruit, jugular venous pressure normal Abdomen: Soft, non-tender, non-distended with normoactive bowel sounds. No hepatosplenomegaly. Abdominal aorta is normal size without bruit Extremities: trace edema, no clubbing, no cyanosis, no ulcers,  Peripheral: 2+ radial, 2+ femoral, 2+ dorsal pedal pulses Neuro: Alert and oriented. Moves all extremities spontaneously. Psych:  Responds to questions appropriately with a normal affect.   Intake/Output Summary (Last 24 hours) at 08/20/2021 1004 Last data filed at 08/20/2021 0717 Gross per 24 hour  Intake 1040 ml  Output 1250 ml  Net -210 ml     Inpatient Medications:   amiodarone  200 mg Oral BID   aspirin EC  81 mg Oral Daily   cyanocobalamin  1,000 mcg Intramuscular Q30 days   DULoxetine  30 mg Oral QPM   ferrous sulfate  325 mg Oral BID WC   gabapentin  600 mg Oral BID   insulin aspart  0-5  Units Subcutaneous QHS   insulin aspart  0-9 Units Subcutaneous TID WC   methadone  5 mg Oral BID   methimazole   20 mg Oral Daily   metoprolol tartrate  25 mg Oral BID   multivitamin with minerals  1 tablet Oral Daily   potassium chloride  40 mEq Oral Once   pravastatin  40 mg Oral QPM   torsemide  20 mg Oral Daily   warfarin  5 mg Oral ONCE-1600   Warfarin - Pharmacist Dosing Inpatient   Does not apply q1600   Infusions:     Labs: Recent Labs    08/19/21 0503 08/20/21 0552  NA 137 137  K 4.0 3.4*  CL 105 103  CO2 28 28  GLUCOSE 180* 161*  BUN 40* 26*  CREATININE 0.93 0.84  CALCIUM 8.3* 7.7*  MG 2.3 2.1  PHOS 2.9 2.9    Recent Labs    08/18/21 0649  AST 18  ALT 15  ALKPHOS 71  BILITOT 0.3  PROT 6.5  ALBUMIN 2.9*    Recent Labs    08/19/21 0503 08/19/21 1828 08/20/21 0552  WBC 14.0*  --  10.6*  HGB 6.8* 8.0* 7.6*  HCT 21.8* 23.6* 22.2*  MCV 89.7  --  90.2  PLT 263  --  214    No results for input(s): CKTOTAL, CKMB, TROPONINI in the last 72 hours. Invalid input(s): POCBNP No results for input(s): HGBA1C in the last 72 hours.    Weights: Filed Weights   08/17/21 0827 08/17/21 1700  Weight: 122 kg 118.7 kg     Radiology/Studies:  CT ABDOMEN PELVIS WO CONTRAST  Result Date: 08/19/2021 CLINICAL DATA:  HGB dropped without any obvious bleeding, requiring blood transfusion, want to rule out any retroperitoneal bleed. EXAM: CT ABDOMEN AND PELVIS WITHOUT CONTRAST TECHNIQUE: Multidetector CT imaging of the abdomen and pelvis was performed following the standard protocol without IV contrast. COMPARISON:  Contrast enhanced CT 12/28/2018 FINDINGS: Lower chest: Prosthetic aortic valve. Decreased density of the blood pool consistent with anemia. There is mild elevation of left hemidiaphragm. No acute airspace disease or pleural effusion. Bilateral gynecomastia. Hepatobiliary: Mild hepatic steatosis without focal hepatic lesion on this unenhanced exam. Gallbladder partially distended, no calcified stone. No biliary dilatation. Pancreas: Mild fatty atrophy.  No ductal dilatation  or inflammation. Spleen: Normal in size without focal abnormality. Adrenals/Urinary Tract: Normal adrenal glands. No hydronephrosis or renal calculi. There is faint bilateral perinephric edema that is chronic and similar to prior exam. The previous cyst in the lower left kidney is not well delineated on the current exam. Physiologically distended urinary bladder. No bladder wall thickening. Stomach/Bowel: Small hiatal hernia. Stomach is decompressed. There is a small duodenal diverticulum. Occasional fecalization of distal small bowel contents. No small bowel inflammation or obstruction. Normal appendix. Moderate volume of stool throughout the colon. There is no colonic wall thickening or pericolonic edema. Vascular/Lymphatic: Moderate aortic atherosclerosis. No aortic aneurysm. There is no retroperitoneal or portal venous gas. There is no enlargement in the trip air today and suggest retroperitoneal hemorrhage. There is no enlargement of the abdominal wall musculature to suggest rectus sheath hematoma. No hemoperitoneum or free fluid. No enlarged lymph nodes in the abdomen or pelvis. Reproductive: Grossly unremarkable prostate gland, partially obscured by streak artifact from right hip arthroplasty. Other: No free fluid or free air. Tiny fat containing umbilical hernia. There is fat within the left inguinal canal. No evidence of abdominopelvic hemorrhage or hematoma. No body wall abnormality. Musculoskeletal:  Right hip arthroplasty. Posterior lumbar fusion L4 through S1. 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 No acute osseous abnormalities are seen. Remote left eleventh rib fracture. IMPRESSION: 1. No evidence of abdominopelvic hemorrhage or acute abnormality. No retroperitoneal or rectus sheath hematoma. 2. Moderate colonic stool burden with fecalization of distal small bowel contents, suggesting slow transit. No bowel obstruction or inflammation. 3. Mild hepatic steatosis.  Aortic Atherosclerosis (ICD10-I70.0). Electronically Signed   By: Keith Rake M.D.   On: 08/19/2021 17:09   DG Chest 2 View  Result Date: 08/17/2021 CLINICAL DATA:  SOB EXAM: CHEST - 2 VIEW COMPARISON:  None. FINDINGS: Status post prosthetic heart valve. Median sternotomy. Anterior cervical fusion hardware. The heart size is within normal limits. No pleural effusion. No pneumothorax. No mass or consolidation. No acute osseous abnormality. Calcifications of the aortic arch. IMPRESSION: No acute findings in the chest. Electronically Signed   By: Albin Felling M.D.   On: 08/17/2021 09:15     Assessment and Recommendation  75 y.o. male with a past medical history of aortic valve stenosis status post mechanical aortic valve replacement, hypertension, hyperlipidemia, diabetes, lower extremity edema, paroxysmal nonvalvular atrial fibrillation with rapid ventricular rate.  Patient has no current evidence of myocardial infarction or acute coronary syndrome or congestive heart failure at this time.  There is some concern for significant anemia with hemoglobin on admission of 11.7, then dropping to 6.8 requiring transfusion without evidence of overt bleeding complications.  Plan: -Continue with conservative management of atrial fibrillation at this time while patient undergoes diagnostic testing and management for significant anemia.  The patient is at low risk for cardiovascular complication with colonoscopy and endoscopy -Continuation of amiodarone at 200 mg twice per day for possible spontaneous conversion to normal sinus rhythm. -Continue anticoagulation with warfarin for goal INR between 2 and 3 for mechanical aortic valve and atrial fibrillation -Continue metoprolol for heart rate control of atrial fibrillation and previous diastolic dysfunction heart failure -No further cardiac diagnostics necessary at this time -Continue cholesterol management with pravastatin -Continue torsemide for lower  extremity edema which is currently well controlled with  Signed, Serafina Royals, MD, [facility]

## 2021-08-20 NOTE — Progress Notes (Signed)
ANTICOAGULATION CONSULT NOTE -   Pharmacy Consult for warfarin Indication: atrial fibrillation  Allergies  Allergen Reactions   Acetaminophen-Codeine Other (See Comments)    Other reaction(s): Headache Other reaction(s): Headache   Demeclocycline Hives   Tetracycline Hives   Tetracyclines & Related Hives    Patient Measurements: Height: 5\' 10"  (177.8 cm) Weight: 118.7 kg (261 lb 11 oz) IBW/kg (Calculated) : 73   Vital Signs: Temp: 98.3 F (36.8 C) (11/02 0727) Temp Source: Oral (11/02 0727) BP: 114/64 (11/02 0727) Pulse Rate: 106 (11/02 0727)  Labs: Recent Labs    08/17/21 1235 08/17/21 1235 08/18/21 0649 08/19/21 0503 08/19/21 1828 08/20/21 0552 08/20/21 0847  HGB  --    < > 8.4* 6.8* 8.0* 7.6*  --   HCT  --    < > 24.7* 21.8* 23.6* 22.2*  --   PLT  --   --  231 263  --  214  --   LABPROT 21.0*  --  23.8* 29.0*  --   --  25.6*  INR 1.8*  --  2.1* 2.7*  --   --  2.3*  CREATININE  --   --  0.82 0.93  --  0.84  --   TROPONINIHS 5  --   --   --   --   --   --    < > = values in this interval not displayed.     Estimated Creatinine Clearance: 98.1 mL/min (by C-G formula based on SCr of 0.84 mg/dL).   Medical History: Past Medical History:  Diagnosis Date   Anemia    CHF (congestive heart failure) (HCC)    Chronic pain    Coronary artery disease    DDD (degenerative disc disease), hip    Diabetes mellitus without complication (HCC)    DJD (degenerative joint disease)    Dysrhythmia    GERD (gastroesophageal reflux disease)    Hyperlipemia    Hypertension    Insomnia    Neuropathy    Paroxysmal A-fib (HCC)    Peripheral neuropathy    Stroke [facility])    Thoracic aortic atherosclerosis (HCC)     Medications:  Medications Prior to Admission  Medication Sig Dispense Refill Last Dose   aspirin EC 81 MG tablet Take 81 mg by mouth daily.   08/17/2021   DULoxetine (CYMBALTA) 30 MG capsule Take 30 mg by mouth every evening.   08/16/2021 at NIGHT   ferrous  sulfate 325 (65 FE) MG tablet Take 1 tablet (325 mg total) by mouth 2 (two) times daily with a meal. 60 tablet 3 08/17/2021 at AM   gabapentin (NEURONTIN) 600 MG tablet Take 600 mg by mouth 2 (two) times daily.   08/17/2021   glipiZIDE (GLUCOTROL XL) 5 MG 24 hr tablet Take 5 mg by mouth daily.   08/17/2021 at AM   loratadine (CLARITIN) 10 MG tablet Take 10 mg by mouth daily.   08/17/2021 at AM   metFORMIN (GLUCOPHAGE) 1000 MG tablet Take 1,000 mg by mouth 2 (two) times daily. With meals.   08/17/2021 at AM   methadone (DOLOPHINE) 5 MG tablet Take 5 mg by mouth 2 (two) times daily.    08/17/2021 at AM   methimazole (TAPAZOLE) 10 MG tablet Take 20 mg by mouth daily.   08/17/2021 at AM   metoprolol tartrate (LOPRESSOR) 25 MG tablet Take 25 mg by mouth 2 (two) times daily.    08/17/2021 at AM   Multiple Vitamin (MULTI-VITAMINS) TABS Take 1  tablet by mouth daily.   08/17/2021   pravastatin (PRAVACHOL) 40 MG tablet Take 40 mg by mouth every evening.    08/16/2021 at PM   torsemide (DEMADEX) 20 MG tablet Take by mouth.   08/17/2021 at AM   warfarin (COUMADIN) 2.5 MG tablet Take 2.5 mg by mouth. Take on Monday and Friday only   08/15/2021   warfarin (COUMADIN) 5 MG tablet Take 5 mg by mouth daily. Take on Tuesday, Wednesday, Thursday, Saturday, and Sunday   08/16/2021 at PM   amiodarone (PACERONE) 200 MG tablet Take 1 tablet (200 mg total) by mouth daily. (Patient not taking: No sig reported) 30 tablet 1 Not Taking   amitriptyline (ELAVIL) 10 MG tablet Take by mouth. (Patient not taking: Reported on 08/17/2021)   Not Taking   cyanocobalamin (,VITAMIN B-12,) 1000 MCG/ML injection Inject 1,000 mcg into the muscle every 30 (thirty) days.   07/18/2021   cyanocobalamin 1000 MCG tablet Take 1,000 mcg by mouth daily. (Patient not taking: No sig reported)   Not Taking   cyclobenzaprine (FLEXERIL) 10 MG tablet Take 10 mg by mouth daily as needed for muscle spasms.    PRN at PRN   KLOR-CON M20 20 MEQ tablet Take by  mouth. (Patient not taking: No sig reported)   Not Taking   meclizine (ANTIVERT) 25 MG tablet Take 1 tablet by mouth 3 (three) times daily. (Patient not taking: No sig reported)   Not Taking   pantoprazole (PROTONIX) 40 MG tablet Take 40 mg by mouth daily. (Patient not taking: No sig reported)   Not Taking   pioglitazone (ACTOS) 30 MG tablet Take 30 mg by mouth daily. (Patient not taking: Reported on 08/17/2021)   Not Taking   Scheduled:   amiodarone  200 mg Oral BID   aspirin EC  81 mg Oral Daily   cyanocobalamin  1,000 mcg Intramuscular Q30 days   DULoxetine  30 mg Oral QPM   ferrous sulfate  325 mg Oral BID WC   gabapentin  600 mg Oral BID   insulin aspart  0-5 Units Subcutaneous QHS   insulin aspart  0-9 Units Subcutaneous TID WC   methadone  5 mg Oral BID   methimazole  20 mg Oral Daily   metoprolol tartrate  25 mg Oral BID   multivitamin with minerals  1 tablet Oral Daily   pravastatin  40 mg Oral QPM   torsemide  20 mg Oral Daily   Warfarin - Pharmacist Dosing Inpatient   Does not apply q1600   Infusions:    PRN: acetaminophen **OR** acetaminophen, bisacodyl, cyclobenzaprine, metoprolol tartrate, ondansetron **OR** ondansetron (ZOFRAN) IV, senna-docusate Anti-infectives (From admission, onward)    None       Assessment: 75YOM presenting with shortness of breath and weakness. PMH includes Afib, CAD, HLD, DM, and stroke.  EKG shows atrial fibrillation with mildly elevated rate. Pt also has hx of mechanical aortic valve. Hgb is trending down. No note of any bleeding.   Warfarin PTA (per discussion with med rec technician):  5 mg on Tue/Wed/Thurs/Sat/Sun.  2.5 mg on Monday and Friday only  DDI: Amiodarone - enhanced anticoagulant effect and increased serum concentration  Date INR Warfarin Dose  10/29 -- 5 mg (PTA)  10/30 1.8 7.5 mg  10/31 2.1 2.5 mg  11/1 2.7 2.5 mg  11/2 2.3 5 mg    Goal of Therapy:  INR 2-3 due to mechanical aortic valve will aim for an INR of  2.5.  Monitor  platelets by anticoagulation protocol: Yes   Plan:  INR is therapeutic. Will given warfarin 5 mg x 1 today (home dose) as Hgb is trending down and pt received a big warfarin dose on 10/30. Also pt was started on amiodarone. Daily INR and CBC at least every 3 days.    Oswald Hillock, PharmD Clinical Pharmacist  08/20/2021 9:26 AM

## 2021-08-21 ENCOUNTER — Telehealth: Payer: Self-pay | Admitting: Oncology

## 2021-08-21 DIAGNOSIS — D649 Anemia, unspecified: Secondary | ICD-10-CM

## 2021-08-21 LAB — BPAM RBC
Blood Product Expiration Date: 202211142359
Blood Product Expiration Date: 202211202359
ISSUE DATE / TIME: 202211011346
ISSUE DATE / TIME: 202211021602
Unit Type and Rh: 7300
Unit Type and Rh: 7300

## 2021-08-21 LAB — CBC
HCT: 25.9 % — ABNORMAL LOW (ref 39.0–52.0)
Hemoglobin: 8.5 g/dL — ABNORMAL LOW (ref 13.0–17.0)
MCH: 29.7 pg (ref 26.0–34.0)
MCHC: 32.8 g/dL (ref 30.0–36.0)
MCV: 90.6 fL (ref 80.0–100.0)
Platelets: 216 10*3/uL (ref 150–400)
RBC: 2.86 MIL/uL — ABNORMAL LOW (ref 4.22–5.81)
RDW: 15.4 % (ref 11.5–15.5)
WBC: 9.1 10*3/uL (ref 4.0–10.5)
nRBC: 1 % — ABNORMAL HIGH (ref 0.0–0.2)

## 2021-08-21 LAB — RETICULOCYTES
Immature Retic Fract: 49 % — ABNORMAL HIGH (ref 2.3–15.9)
RBC.: 2.92 MIL/uL — ABNORMAL LOW (ref 4.22–5.81)
Retic Count, Absolute: 169.9 10*3/uL (ref 19.0–186.0)
Retic Ct Pct: 5.8 % — ABNORMAL HIGH (ref 0.4–3.1)

## 2021-08-21 LAB — BASIC METABOLIC PANEL
Anion gap: 6 (ref 5–15)
BUN: 19 mg/dL (ref 8–23)
CO2: 27 mmol/L (ref 22–32)
Calcium: 7.8 mg/dL — ABNORMAL LOW (ref 8.9–10.3)
Chloride: 104 mmol/L (ref 98–111)
Creatinine, Ser: 0.85 mg/dL (ref 0.61–1.24)
GFR, Estimated: >60 mL/min (ref >60)
Glucose, Bld: 153 mg/dL — ABNORMAL HIGH (ref 70–99)
Potassium: 3.5 mmol/L (ref 3.5–5.1)
Sodium: 137 mmol/L (ref 135–145)

## 2021-08-21 LAB — GLUCOSE, CAPILLARY: Glucose-Capillary: 197 mg/dL — ABNORMAL HIGH (ref 70–99)

## 2021-08-21 LAB — TYPE AND SCREEN
ABO/RH(D): B POS
Antibody Screen: NEGATIVE
Unit division: 0
Unit division: 0

## 2021-08-21 LAB — PHOSPHORUS: Phosphorus: 3.2 mg/dL (ref 2.5–4.6)

## 2021-08-21 LAB — PROTIME-INR
INR: 2.2 — ABNORMAL HIGH (ref 0.8–1.2)
Prothrombin Time: 24.1 seconds — ABNORMAL HIGH (ref 11.4–15.2)

## 2021-08-21 LAB — HAPTOGLOBIN: Haptoglobin: 200 mg/dL (ref 34–355)

## 2021-08-21 LAB — MAGNESIUM: Magnesium: 2 mg/dL (ref 1.7–2.4)

## 2021-08-21 MED ORDER — AMIODARONE HCL 200 MG PO TABS
200.0000 mg | ORAL_TABLET | Freq: Two times a day (BID) | ORAL | 0 refills | Status: AC
Start: 2021-08-21 — End: ?

## 2021-08-21 MED ORDER — POLYETHYLENE GLYCOL 3350 17 G PO PACK: 17.0000 g | PACK | Freq: Every day | ORAL | Status: DC | PRN

## 2021-08-21 MED ORDER — WARFARIN SODIUM 5 MG PO TABS
5.0000 mg | ORAL_TABLET | Freq: Once | ORAL | Status: DC
  Filled 2021-08-21: qty 1

## 2021-08-21 MED ORDER — POLYETHYLENE GLYCOL 3350 17 G PO PACK
17.0000 g | PACK | Freq: Once | ORAL | Status: AC
  Administered 2021-08-21: 17 g via ORAL
  Filled 2021-08-21: qty 1

## 2021-08-21 NOTE — Telephone Encounter (Signed)
Pt is being d/c from the hospital and needs a follow up appt. Please call back at [phone]

## 2021-08-21 NOTE — Care Management Important Message (Signed)
Important Message  Patient Details  Name: DEANTA MINCEY MRN: [id number] Date of Birth: [birth year]   Medicare Important Message Given:  No  Patient discharged prior to arrival to unit to deliver concurrent Medicare IM.   Dannette Barbara 08/21/2021, 3:08 PM

## 2021-08-21 NOTE — Telephone Encounter (Signed)
Dr. Grayland Ormond, plan was for patient to have bome marrow biopsy after hospital discharge with MD f/u 7-10 days later.  Looks like he is discharged today.  Is there a specific time frame from discharge you would like to obtain bone marrow biopsy?

## 2021-08-21 NOTE — Progress Notes (Signed)
ANTICOAGULATION CONSULT NOTE -   Pharmacy Consult for warfarin Indication: atrial fibrillation  Allergies  Allergen Reactions   Acetaminophen-Codeine Other (See Comments)    Other reaction(s): Headache Other reaction(s): Headache   Demeclocycline Hives   Tetracycline Hives   Tetracyclines & Related Hives    Patient Measurements: Height: 5\' 10"  (177.8 cm) Weight: 118.7 kg (261 lb 11 oz) IBW/kg (Calculated) : 73   Vital Signs: Temp: 98 F (36.7 C) (11/03 0408) BP: 93/53 (11/03 0730) Pulse Rate: 77 (11/03 0730)  Labs: Recent Labs    08/19/21 0503 08/19/21 1828 08/20/21 0552 08/20/21 0847 08/21/21 0505  HGB 6.8* 8.0* 7.6*  --  8.5*  HCT 21.8* 23.6* 22.2*  --  25.9*  PLT 263  --  214  --  216  LABPROT 29.0*  --   --  25.6* 24.1*  INR 2.7*  --   --  2.3* 2.2*  CREATININE 0.93  --  0.84  --  0.85     Estimated Creatinine Clearance: 97 mL/min (by C-G formula based on SCr of 0.85 mg/dL).   Medical History: Past Medical History:  Diagnosis Date   Anemia    CHF (congestive heart failure) (HCC)    Chronic pain    Coronary artery disease    DDD (degenerative disc disease), hip    Diabetes mellitus without complication (HCC)    DJD (degenerative joint disease)    Dysrhythmia    GERD (gastroesophageal reflux disease)    Hyperlipemia    Hypertension    Insomnia    Neuropathy    Paroxysmal A-fib (HCC)    Peripheral neuropathy    Stroke [facility])    Thoracic aortic atherosclerosis (HCC)     Medications:  Medications Prior to Admission  Medication Sig Dispense Refill Last Dose   aspirin EC 81 MG tablet Take 81 mg by mouth daily.   08/17/2021   DULoxetine (CYMBALTA) 30 MG capsule Take 30 mg by mouth every evening.   08/16/2021 at NIGHT   ferrous sulfate 325 (65 FE) MG tablet Take 1 tablet (325 mg total) by mouth 2 (two) times daily with a meal. 60 tablet 3 08/17/2021 at AM   gabapentin (NEURONTIN) 600 MG tablet Take 600 mg by mouth 2 (two) times daily.   08/17/2021    glipiZIDE (GLUCOTROL XL) 5 MG 24 hr tablet Take 5 mg by mouth daily.   08/17/2021 at AM   loratadine (CLARITIN) 10 MG tablet Take 10 mg by mouth daily.   08/17/2021 at AM   metFORMIN (GLUCOPHAGE) 1000 MG tablet Take 1,000 mg by mouth 2 (two) times daily. With meals.   08/17/2021 at AM   methadone (DOLOPHINE) 5 MG tablet Take 5 mg by mouth 2 (two) times daily.    08/17/2021 at AM   methimazole (TAPAZOLE) 10 MG tablet Take 20 mg by mouth daily.   08/17/2021 at AM   metoprolol tartrate (LOPRESSOR) 25 MG tablet Take 25 mg by mouth 2 (two) times daily.    08/17/2021 at AM   Multiple Vitamin (MULTI-VITAMINS) TABS Take 1 tablet by mouth daily.   08/17/2021   pravastatin (PRAVACHOL) 40 MG tablet Take 40 mg by mouth every evening.    08/16/2021 at PM   torsemide (DEMADEX) 20 MG tablet Take by mouth.   08/17/2021 at AM   warfarin (COUMADIN) 2.5 MG tablet Take 2.5 mg by mouth. Take on Monday and Friday only   08/15/2021   warfarin (COUMADIN) 5 MG tablet Take 5 mg by mouth  daily. Take on Tuesday, Wednesday, Thursday, Saturday, and Sunday   08/16/2021 at PM   amiodarone (PACERONE) 200 MG tablet Take 1 tablet (200 mg total) by mouth daily. (Patient not taking: No sig reported) 30 tablet 1 Not Taking   amitriptyline (ELAVIL) 10 MG tablet Take by mouth. (Patient not taking: Reported on 08/17/2021)   Not Taking   cyanocobalamin (,VITAMIN B-12,) 1000 MCG/ML injection Inject 1,000 mcg into the muscle every 30 (thirty) days.   07/18/2021   cyanocobalamin 1000 MCG tablet Take 1,000 mcg by mouth daily. (Patient not taking: No sig reported)   Not Taking   cyclobenzaprine (FLEXERIL) 10 MG tablet Take 10 mg by mouth daily as needed for muscle spasms.    PRN at PRN   KLOR-CON M20 20 MEQ tablet Take by mouth. (Patient not taking: No sig reported)   Not Taking   meclizine (ANTIVERT) 25 MG tablet Take 1 tablet by mouth 3 (three) times daily. (Patient not taking: No sig reported)   Not Taking   pantoprazole (PROTONIX) 40 MG  tablet Take 40 mg by mouth daily. (Patient not taking: No sig reported)   Not Taking   pioglitazone (ACTOS) 30 MG tablet Take 30 mg by mouth daily. (Patient not taking: Reported on 08/17/2021)   Not Taking   Scheduled:   amiodarone  200 mg Oral BID   aspirin EC  81 mg Oral Daily   cyanocobalamin  1,000 mcg Intramuscular Q30 days   DULoxetine  30 mg Oral QPM   ferrous sulfate  325 mg Oral BID WC   gabapentin  600 mg Oral BID   insulin aspart  0-5 Units Subcutaneous QHS   insulin aspart  0-9 Units Subcutaneous TID WC   methadone  5 mg Oral BID   methimazole  20 mg Oral Daily   metoprolol tartrate  25 mg Oral BID   multivitamin with minerals  1 tablet Oral Daily   pravastatin  40 mg Oral QPM   torsemide  20 mg Oral Daily   Warfarin - Pharmacist Dosing Inpatient   Does not apply q1600   Infusions:    PRN: acetaminophen **OR** acetaminophen, bisacodyl, cyclobenzaprine, metoprolol tartrate, ondansetron **OR** ondansetron (ZOFRAN) IV, senna-docusate Anti-infectives (From admission, onward)    None       Assessment: 75YOM presenting with shortness of breath and weakness. PMH includes Afib, CAD, HLD, DM, and stroke.  EKG shows atrial fibrillation with mildly elevated rate. Pt also has hx of mechanical aortic valve. Hgb is trending down. No note of any bleeding.   Warfarin PTA (per discussion with med rec technician):  5 mg on Tue/Wed/Thurs/Sat/Sun.  2.5 mg on Monday and Friday only  DDI: Amiodarone - enhanced anticoagulant effect and increased serum concentration  Date INR Warfarin Dose  10/29 -- 5 mg (PTA)  10/30 1.8 7.5 mg  10/31 2.1 2.5 mg  11/1 2.7 2.5 mg  11/2 2.3 5 mg  11/3 2.2 5 mg    Goal of Therapy:  INR 2-3 due to mechanical aortic valve will aim for an INR of 2.5.  Monitor platelets by anticoagulation protocol: Yes   Plan:  INR is therapeutic. Will given warfarin 5 mg x 1 today (home dose) as Hgb is trending down but received RBC transfusion 11/2. Also pt was  started on amiodarone - dose warfarin cautiously. Daily INR and CBC at least every 3 days.    Oswald Hillock, PharmD Clinical Pharmacist  08/21/2021 9:16 AM

## 2021-08-21 NOTE — Plan of Care (Signed)

## 2021-08-21 NOTE — Addendum Note (Signed)
Addended by: Vanice Sarah on: 08/21/2021 04:23 PM   Modules accepted: Orders

## 2021-08-21 NOTE — Discharge Summary (Signed)
Physician Discharge Summary  [name]:[id number] DOB: Sep 30, 1946 DOA: 08/17/2021  PCP: Idelle Crouch, MD  Admit date: 08/17/2021 Discharge date: 08/21/2021  Admitted From: Home Disposition: Home  Recommendations for Outpatient Follow-up:  Follow up with PCP in 1-2 weeks Follow-up with cardiology Follow-up with hematology in 1 week Please obtain BMP/CBC in one week Please follow up on the following pending results: None  Home Health: No Equipment/Devices: None Discharge Condition: Stable CODE STATUS: Full Diet recommendation: Heart Healthy / Carb Modified   Brief/Interim Summary: 75 year old gentleman with past medical history significant for anemia, CHF (last echo documented demonstrated an EF of 60-65% in 2018), CAD, DDD, DM II (HbA1c of 7.3), GERD, Hyperlipidemia, Hypertension, Neuropathy, PAF, Stroke, and ascvd of the thoracic aorta presented to ED with sudden onset dyspnea.  Found to have A. fib with RVR.  Patient has an history of paroxysmal atrial fibrillation and used to take amiodarone which has been stopped remotely. Cardiology was consulted and he was started on amiodarone infusion, which was later transitioned to p.o. amiodarone.  Patient is being discharged on 200 mg twice daily and he will follow-up with cardiology closely for further recommendations.  He will continue with Coumadin with a goal INR of 2-3 per cardiology.  Patient also developed worsening anemia requiring blood transfusions of 2 unit of PRBC, has an history of similar episodes approximately 1 to 2 years ago.  No history of recent viral illness.  No obvious bleeding.  Had a recent normal colonoscopy.  FOBT negative.  Abdominal and pelvic scan was without any obvious bleeding.  Smear without any significant abnormality.  No sign of hemolysis.  Anemia panel with normal iron, folate and B12 levels. Hematology was consulted and they are recommending outpatient follow-up in 7 to 10 days for bone marrow  biopsy.  Hemoglobin on the day of discharge was 8.5.  Patient will continue the rest of his home medications and follow-up with his providers.  Discharge Diagnoses:  Active Problems:   Anemia   Coronary artery disease   Diabetes (HCC)   S/P AVR (aortic valve replacement)   Atrial fibrillation with RVR (HCC)   Dyspnea on exertion   Generalized weakness   Discharge Instructions  Discharge Instructions     Diet - low sodium heart healthy   Complete by: As directed    Discharge instructions   Complete by: As directed    It was pleasure taking care of you. You are being restarted on amiodarone by your cardiologist, please take it as directed and follow-up with your cardiologist closely for further recommendations. Please follow-up with hematology for further investigation of your anemia.   Discharge wound care:   Complete by: As directed    Apply piece of Aquacel to wound, then cover with 4X4 and foam heel cup. (Do not throw away) Then cover with kerlex and coban.  Moisten previous dressing with NS to remove. Please follow-up with your outpatient wound management clinic.   Increase activity slowly   Complete by: As directed       Allergies as of 08/21/2021       Reactions   Acetaminophen-codeine Other (See Comments)   Other reaction(s): Headache Other reaction(s): Headache   Demeclocycline Hives   Tetracycline Hives   Tetracyclines & Related Hives        Medication List     STOP taking these medications    amitriptyline 10 MG tablet Commonly known as: ELAVIL   meclizine 25 MG tablet Commonly known as:  ANTIVERT   pioglitazone 30 MG tablet Commonly known as: ACTOS       TAKE these medications    amiodarone 200 MG tablet Commonly known as: PACERONE Take 1 tablet (200 mg total) by mouth 2 (two) times daily. What changed: when to take this   aspirin EC 81 MG tablet Take 81 mg by mouth daily.   cyanocobalamin 1000 MCG/ML injection Commonly known as:  (VITAMIN B-12) Inject 1,000 mcg into the muscle every 30 (thirty) days. What changed: Another medication with the same name was removed. Continue taking this medication, and follow the directions you see here.   cyclobenzaprine 10 MG tablet Commonly known as: FLEXERIL Take 10 mg by mouth daily as needed for muscle spasms.   DULoxetine 30 MG capsule Commonly known as: CYMBALTA Take 30 mg by mouth every evening.   ferrous sulfate 325 (65 FE) MG tablet Take 1 tablet (325 mg total) by mouth 2 (two) times daily with a meal.   gabapentin 600 MG tablet Commonly known as: NEURONTIN Take 600 mg by mouth 2 (two) times daily.   glipiZIDE 5 MG 24 hr tablet Commonly known as: GLUCOTROL XL Take 5 mg by mouth daily.   Klor-Con M20 20 MEQ tablet Generic drug: potassium chloride SA Take by mouth.   loratadine 10 MG tablet Commonly known as: CLARITIN Take 10 mg by mouth daily.   metFORMIN 1000 MG tablet Commonly known as: GLUCOPHAGE Take 1,000 mg by mouth 2 (two) times daily. With meals.   methadone 5 MG tablet Commonly known as: DOLOPHINE Take 5 mg by mouth 2 (two) times daily.   methimazole 10 MG tablet Commonly known as: TAPAZOLE Take 20 mg by mouth daily.   metoprolol tartrate 25 MG tablet Commonly known as: LOPRESSOR Take 25 mg by mouth 2 (two) times daily.   Multi-Vitamins Tabs Take 1 tablet by mouth daily.   pantoprazole 40 MG tablet Commonly known as: PROTONIX Take 40 mg by mouth daily.   pravastatin 40 MG tablet Commonly known as: PRAVACHOL Take 40 mg by mouth every evening.   torsemide 20 MG tablet Commonly known as: DEMADEX Take by mouth.   warfarin 5 MG tablet Commonly known as: COUMADIN Take 5 mg by mouth daily. Take on Tuesday, Wednesday, Thursday, Saturday, and Sunday   warfarin 2.5 MG tablet Commonly known as: COUMADIN Take 2.5 mg by mouth. Take on Monday and Friday only               Discharge Care Instructions  (From admission, onward)            Start     Ordered   08/21/21 0000  Discharge wound care:       Comments: Apply piece of Aquacel to wound, then cover with 4X4 and foam heel cup. (Do not throw away) Then cover with kerlex and coban.  Moisten previous dressing with NS to remove. Please follow-up with your outpatient wound management clinic.   08/21/21 1315            Follow-up Information     Idelle Crouch, MD. Schedule an appointment as soon as possible for a visit in 1 week(s).   Specialty: Internal Medicine Contact information: [place] [address] [phone]         Corey Skains, MD. Schedule an appointment as soon as possible for a visit in 1 week(s).   Specialty: Cardiology Contact information: [address] [facility] [place] [address]  [phone]         Lloyd Huger, MD. Schedule an appointment as soon as possible for a visit in 1 week(s).   Specialty: Oncology Contact information: [place] [address] [phone]                Allergies  Allergen Reactions   Acetaminophen-Codeine Other (See Comments)    Other reaction(s): Headache Other reaction(s): Headache   Demeclocycline Hives   Tetracycline Hives   Tetracyclines & Related Hives    Consultations: Cardiology Hematology  Procedures/Studies: CT ABDOMEN PELVIS WO CONTRAST  Result Date: 08/19/2021 CLINICAL DATA:  HGB dropped without any obvious bleeding, requiring blood transfusion, want to rule out any retroperitoneal bleed. EXAM: CT ABDOMEN AND PELVIS WITHOUT CONTRAST TECHNIQUE: Multidetector CT imaging of the abdomen and pelvis was performed following the standard protocol without IV contrast. COMPARISON:  Contrast enhanced CT 12/28/2018 FINDINGS: Lower chest: Prosthetic aortic valve. Decreased density of the blood pool consistent with anemia. There is mild elevation of left hemidiaphragm. No acute airspace disease  or pleural effusion. Bilateral gynecomastia. Hepatobiliary: Mild hepatic steatosis without focal hepatic lesion on this unenhanced exam. Gallbladder partially distended, no calcified stone. No biliary dilatation. Pancreas: Mild fatty atrophy.  No ductal dilatation or inflammation. Spleen: Normal in size without focal abnormality. Adrenals/Urinary Tract: Normal adrenal glands. No hydronephrosis or renal calculi. There is faint bilateral perinephric edema that is chronic and similar to prior exam. The previous cyst in the lower left kidney is not well delineated on the current exam. Physiologically distended urinary bladder. No bladder wall thickening. Stomach/Bowel: Small hiatal hernia. Stomach is decompressed. There is a small duodenal diverticulum. Occasional fecalization of distal small bowel contents. No small bowel inflammation or obstruction. Normal appendix. Moderate volume of stool throughout the colon. There is no colonic wall thickening or pericolonic edema. Vascular/Lymphatic: Moderate aortic atherosclerosis. No aortic aneurysm. There is no retroperitoneal or portal venous gas. There is no enlargement in the trip air today and suggest retroperitoneal hemorrhage. There is no enlargement of the abdominal wall musculature to suggest rectus sheath hematoma. No hemoperitoneum or free fluid. No enlarged lymph nodes in the abdomen or pelvis. Reproductive: Grossly unremarkable prostate gland, partially obscured by streak artifact from right hip arthroplasty. Other: No free fluid or free air. Tiny fat containing umbilical hernia. There is fat within the left inguinal canal. No evidence of abdominopelvic hemorrhage or hematoma. No body wall abnormality. Musculoskeletal: Right hip arthroplasty. Posterior lumbar fusion L4 through S1. 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 No acute osseous abnormalities are seen. Remote left eleventh rib fracture. IMPRESSION: 1. No evidence of  abdominopelvic hemorrhage or acute abnormality. No retroperitoneal or rectus sheath hematoma. 2. Moderate colonic stool burden with fecalization of distal small bowel contents, suggesting slow transit. No bowel obstruction or inflammation. 3. Mild hepatic steatosis. Aortic Atherosclerosis (ICD10-I70.0). Electronically Signed   By: Keith Rake M.D.   On: 08/19/2021 17:09   DG Chest 2 View  Result Date: 08/17/2021 CLINICAL DATA:  SOB EXAM: CHEST - 2 VIEW COMPARISON:  None. FINDINGS: Status post prosthetic heart valve. Median sternotomy. Anterior cervical fusion hardware. The heart size is within normal limits. No pleural effusion. No pneumothorax. No mass or consolidation. No acute osseous abnormality. Calcifications of the aortic arch. IMPRESSION: No acute findings in the chest. Electronically Signed   By: Albin Felling M.D.   On: 08/17/2021 09:15    Subjective: Patient was seen and examined during morning rounds.  Feeling much improved and wants to go home.  We discussed about outpatient follow-up with hematology for bone marrow biopsy and he understands.  Discharge Exam: Vitals:   08/21/21 0408 08/21/21 0730  BP: (!) 103/55 (!) 93/53  Pulse: 74 77  Resp: 18   Temp: 98 F (36.7 C)   SpO2: 98% 93%   Vitals:   08/20/21 2144 08/21/21 0026 08/21/21 0408 08/21/21 0730  BP: 115/70 104/69 (!) 103/55 (!) 93/53  Pulse: 99 72 74 77  Resp:  18 18   Temp:  98 F (36.7 C) 98 F (36.7 C)   TempSrc:      SpO2:  (!) 78% 98% 93%  Weight:      Height:        General: Pt is alert, awake, not in acute distress Cardiovascular: RRR, S1/S2 +, no rubs, no gallops Respiratory: CTA bilaterally, no wheezing, no rhonchi Abdominal: Soft, NT, ND, bowel sounds + Extremities: no edema, no cyanosis   The results of significant diagnostics from this hospitalization (including imaging, microbiology, ancillary and laboratory) are listed below for reference.    Microbiology: Recent Results (from the  past 240 hour(s))  Resp Panel by RT-PCR (Flu A&B, Covid) Nasopharyngeal Swab     Status: None   Collection Time: 08/17/21 12:35 PM   Specimen: Nasopharyngeal Swab; Nasopharyngeal(NP) swabs in vial transport medium  Result Value Ref Range Status   SARS Coronavirus 2 by RT PCR NEGATIVE NEGATIVE Final    Comment: (NOTE) SARS-CoV-2 target nucleic acids are NOT DETECTED.  The SARS-CoV-2 RNA is generally detectable in upper respiratory specimens during the acute phase of infection. The lowest concentration of SARS-CoV-2 viral copies this assay can detect is 138 copies/mL. A negative result does not preclude SARS-Cov-2 infection and should not be used as the sole basis for treatment or other patient management decisions. A negative result may occur with  improper specimen collection/handling, submission of specimen other than nasopharyngeal swab, presence of viral mutation(s) within the areas targeted by this assay, and inadequate number of viral copies(<138 copies/mL). A negative result must be combined with clinical observations, patient history, and epidemiological information. The expected result is Negative.  Fact Sheet for Patients:  EntrepreneurPulse.com.au  Fact Sheet for Healthcare Providers:  IncredibleEmployment.be  This test is no t yet approved or cleared by the [place] FDA and  has been authorized for detection and/or diagnosis of SARS-CoV-2 by FDA under an Emergency Use Authorization (EUA). This EUA will remain  in effect (meaning this test can be used) for the duration of the COVID-19 declaration under Section 564(b)(1) of the Act, 21 U.S.C.section 360bbb-3(b)(1), unless the authorization is terminated  or revoked sooner.       Influenza A by PCR NEGATIVE NEGATIVE Final   Influenza B by PCR NEGATIVE NEGATIVE Final    Comment: (NOTE) The Xpert Xpress SARS-CoV-2/FLU/RSV plus assay is intended as an aid in the diagnosis of  influenza from Nasopharyngeal swab specimens and should not be used as a sole basis for treatment. Nasal washings and aspirates are unacceptable for Xpert Xpress SARS-CoV-2/FLU/RSV testing.  Fact Sheet for Patients: EntrepreneurPulse.com.au  Fact Sheet for Healthcare Providers: IncredibleEmployment.be  This test is not yet approved or cleared by the [place] FDA and has been authorized for detection and/or diagnosis of SARS-CoV-2 by FDA under an Emergency Use Authorization (EUA). This EUA will remain in effect (meaning this test can be used) for the duration of the COVID-19 declaration under Section 564(b)(1) of the Act, 21  U.S.C. section 360bbb-3(b)(1), unless the authorization is terminated or revoked.  Performed at [facility], [address] [place], [place] [address]      Labs: BNP (last 3 results) No results for input(s): BNP in the last 8760 hours. Basic Metabolic Panel: Recent Labs  Lab 08/17/21 0829 08/18/21 0649 08/18/21 1432 08/19/21 0503 08/20/21 0552 08/21/21 0505  NA 137 136  --  137 137 137  K 4.6 3.9  --  4.0 3.4* 3.5  CL 101 104  --  105 103 104  CO2 26 23  --  '28 28 27  ' GLUCOSE 204* 254*  --  180* 161* 153*  BUN 52* 61*  --  40* 26* 19  CREATININE 1.04 0.82  --  0.93 0.84 0.85  CALCIUM 8.4* 8.0*  --  8.3* 7.7* 7.8*  MG  --   --  2.2 2.3 2.1 2.0  PHOS  --   --  3.3 2.9 2.9 3.2   Liver Function Tests: Recent Labs  Lab 08/18/21 0649  AST 18  ALT 15  ALKPHOS 71  BILITOT 0.3  PROT 6.5  ALBUMIN 2.9*   No results for input(s): LIPASE, AMYLASE in the last 168 hours. No results for input(s): AMMONIA in the last 168 hours. CBC: Recent Labs  Lab 08/17/21 0829 08/18/21 0649 08/19/21 0503 08/19/21 1828 08/20/21 0552 08/21/21 0505  WBC 10.8* 11.7* 14.0*  --  10.6* 9.1  HGB 11.7* 8.4* 6.8* 8.0* 7.6* 8.5*  HCT 35.4* 24.7* 21.8* 23.6* 22.2* 25.9*  MCV 89.2 87.9 89.7  --  90.2 90.6  PLT 302  231 263  --  214 216   Cardiac Enzymes: No results for input(s): CKTOTAL, CKMB, CKMBINDEX, TROPONINI in the last 168 hours. BNP: Invalid input(s): POCBNP CBG: Recent Labs  Lab 08/20/21 0728 08/20/21 1121 08/20/21 1552 08/20/21 2015 08/21/21 0729  GLUCAP 169* 226* 124* 139* 197*   D-Dimer No results for input(s): DDIMER in the last 72 hours. Hgb A1c No results for input(s): HGBA1C in the last 72 hours. Lipid Profile No results for input(s): CHOL, HDL, LDLCALC, TRIG, CHOLHDL, LDLDIRECT in the last 72 hours. Thyroid function studies Recent Labs    08/18/21 1432  TSH 3.560  T4TOTAL 6.1   Anemia work up Recent Labs    08/18/21 1432 08/21/21 0505  VITAMINB12 205  --   FOLATE 18.9  --   TIBC 358  --   IRON 136  --   RETICCTPCT  --  5.8*   Urinalysis    Component Value Date/Time   COLORURINE YELLOW (A) 12/15/2016 1233   APPEARANCEUR CLEAR (A) 12/15/2016 1233   APPEARANCEUR Clear 07/27/2013 1341   LABSPEC 1.027 12/15/2016 1233   LABSPEC 1.021 07/27/2013 1341   PHURINE 5.0 12/15/2016 1233   GLUCOSEU 150 (A) 12/15/2016 1233   GLUCOSEU Negative 07/27/2013 1341   HGBUR NEGATIVE 12/15/2016 1233   BILIRUBINUR NEGATIVE 12/15/2016 1233   BILIRUBINUR Negative 07/27/2013 1341   KETONESUR NEGATIVE 12/15/2016 1233   PROTEINUR 30 (A) 12/15/2016 1233   NITRITE NEGATIVE 12/15/2016 1233   LEUKOCYTESUR NEGATIVE 12/15/2016 1233   LEUKOCYTESUR Negative 07/27/2013 1341   Sepsis Labs Invalid input(s): PROCALCITONIN,  WBC,  LACTICIDVEN Microbiology Recent Results (from the past 240 hour(s))  Resp Panel by RT-PCR (Flu A&B, Covid) Nasopharyngeal Swab     Status: None   Collection Time: 08/17/21 12:35 PM   Specimen: Nasopharyngeal Swab; Nasopharyngeal(NP) swabs in vial transport medium  Result Value Ref Range Status   SARS Coronavirus 2  by RT PCR NEGATIVE NEGATIVE Final    Comment: (NOTE) SARS-CoV-2 target nucleic acids are NOT DETECTED.  The SARS-CoV-2 RNA is generally  detectable in upper respiratory specimens during the acute phase of infection. The lowest concentration of SARS-CoV-2 viral copies this assay can detect is 138 copies/mL. A negative result does not preclude SARS-Cov-2 infection and should not be used as the sole basis for treatment or other patient management decisions. A negative result may occur with  improper specimen collection/handling, submission of specimen other than nasopharyngeal swab, presence of viral mutation(s) within the areas targeted by this assay, and inadequate number of viral copies(<138 copies/mL). A negative result must be combined with clinical observations, patient history, and epidemiological information. The expected result is Negative.  Fact Sheet for Patients:  EntrepreneurPulse.com.au  Fact Sheet for Healthcare Providers:  IncredibleEmployment.be  This test is no t yet approved or cleared by the [place] FDA and  has been authorized for detection and/or diagnosis of SARS-CoV-2 by FDA under an Emergency Use Authorization (EUA). This EUA will remain  in effect (meaning this test can be used) for the duration of the COVID-19 declaration under Section 564(b)(1) of the Act, 21 U.S.C.section 360bbb-3(b)(1), unless the authorization is terminated  or revoked sooner.       Influenza A by PCR NEGATIVE NEGATIVE Final   Influenza B by PCR NEGATIVE NEGATIVE Final    Comment: (NOTE) The Xpert Xpress SARS-CoV-2/FLU/RSV plus assay is intended as an aid in the diagnosis of influenza from Nasopharyngeal swab specimens and should not be used as a sole basis for treatment. Nasal washings and aspirates are unacceptable for Xpert Xpress SARS-CoV-2/FLU/RSV testing.  Fact Sheet for Patients: EntrepreneurPulse.com.au  Fact Sheet for Healthcare Providers: IncredibleEmployment.be  This test is not yet approved or cleared by the [place] FDA  and has been authorized for detection and/or diagnosis of SARS-CoV-2 by FDA under an Emergency Use Authorization (EUA). This EUA will remain in effect (meaning this test can be used) for the duration of the COVID-19 declaration under Section 564(b)(1) of the Act, 21 U.S.C. section 360bbb-3(b)(1), unless the authorization is terminated or revoked.  Performed at [facility], [address] [place], [place] [address]     Time coordinating discharge: Over 30 minutes  SIGNED:  Lorella Nimrod, MD  [facility] 08/21/2021, 1:19 PM  If 7PM-7AM, please contact night-coverage [email or web address]  This record has been created using Systems analyst. Errors have been sought and corrected,but may not always be located. Such creation errors do not reflect on the standard of care.

## 2021-08-21 NOTE — Telephone Encounter (Signed)
Order entered and schedule request faxed to specialty scheduling.

## 2021-08-21 NOTE — Progress Notes (Signed)
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  Patient does have a history of atrial fibrillation with rapid ventricular rate previously treated with amiodarone for which he had good control.  There had been some concerns that patient had some shortness of breath that may have been a side effect of amiodarone and therefore was discontinued as an outpatient.  Patient has done well for several months off of his amiodarone but now has a recurrence of atrial fibrillation with rapid ventricular rate with associated symptoms including weakness, fatigue.  EKG has shown atrial fibrillation with rapid ventricular rate of approximately 110 bpm.  CXR showed no evidence of CHF.  He has been started on amiodarone drip and has seen much improvement in his heart rate control currently in the 80s on telemetry.    11/1 He is tolerating this medication well however he still has concerns of shortness of breath on exertion, weakness, fatigue.  There is also been some concerns of significant anemia with a hemoglobin on admission of 11.7, currently 6.8.    11/2.  Overall patient feeling much better after infusion of packed red blood cells.  Heart rate much better controlled with medication management including amiodarone.  Atrial fibrillation with controlled ventricular rate by telemetry.  No evidence of congestive heart failure or anginal symptoms at this time.  The patient did have a colonoscopy recently for which the patient did not have any apparent bleeding source  11/3.  Patient overall back to baseline as far as how he feels.  No evidence  of congestive heart failure or concerns of shortness of breath.  Lower extremities with no evidence of significant edema.  Atrial fibrillation still occurring but good heart rate control.  Tolerating anticoagulation well without evidence of evidence of bleeding complications  Review of Systems: Positive for: palpitations,   Negative for: Vision change, hearing change, syncope, dizziness, nausea, vomiting,diarrhea, bloody stool, stomach pain, cough, congestion, diaphoresis, urinary frequency, urinary pain,skin lesions, skin rashes Others previously listed  Objective: Telemetry: atrial fibrillation with controlled rate on 80's Physical Exam: Blood pressure (!) 93/53, pulse 77, temperature 98 F (36.7 C), resp. rate 18, height 5\' 10"  (1.778 m), weight 118.7 kg, SpO2 93 %. Body mass index is 37.55 kg/m. General: Well developed, well nourished, in no acute distress. Head: Normocephalic, atraumatic, sclera non-icteric, no xanthomas, nares are without discharge. Neck: No apparent masses Lungs: Normal respirations with no wheezes, no rhonchi, no rales , no crackles   Heart: irregular rate and rhythm, normal S1 mechanical S2, 2+ aortic murmur, no rub, no gallop, PMI is normal size and placement, carotid upstroke normal without bruit, jugular venous pressure normal Abdomen: Soft, non-tender, non-distended with normoactive bowel sounds. No hepatosplenomegaly. Abdominal aorta is normal size without bruit Extremities: trace edema, no clubbing, no cyanosis, no ulcers,  Peripheral: 2+ radial, 2+ femoral, 2+ dorsal pedal pulses Neuro: Alert and oriented. Moves all extremities spontaneously. Psych:  Responds to questions appropriately with a normal affect.   Intake/Output Summary (Last 24 hours) at 08/21/2021 1233 Last data filed at 08/21/2021 1000 Gross per 24 hour  Intake 440 ml  Output 2200 ml  Net -1760 ml     Inpatient Medications:   amiodarone  200 mg  Oral BID   aspirin EC  81 mg Oral Daily    cyanocobalamin  1,000 mcg Intramuscular Q30 days   DULoxetine  30 mg Oral QPM   ferrous sulfate  325 mg Oral BID WC   gabapentin  600 mg Oral BID   insulin aspart  0-5 Units Subcutaneous QHS   insulin aspart  0-9 Units Subcutaneous TID WC   methadone  5 mg Oral BID   methimazole  20 mg Oral Daily   metoprolol tartrate  25 mg Oral BID   multivitamin with minerals  1 tablet Oral Daily   pravastatin  40 mg Oral QPM   torsemide  20 mg Oral Daily   warfarin  5 mg Oral ONCE-1600   Warfarin - Pharmacist Dosing Inpatient   Does not apply q1600   Infusions:     Labs: Recent Labs    08/20/21 0552 08/21/21 0505  NA 137 137  K 3.4* 3.5  CL 103 104  CO2 28 27  GLUCOSE 161* 153*  BUN 26* 19  CREATININE 0.84 0.85  CALCIUM 7.7* 7.8*  MG 2.1 2.0  PHOS 2.9 3.2    No results for input(s): AST, ALT, ALKPHOS, BILITOT, PROT, ALBUMIN in the last 72 hours.  Recent Labs    08/20/21 0552 08/21/21 0505  WBC 10.6* 9.1  HGB 7.6* 8.5*  HCT 22.2* 25.9*  MCV 90.2 90.6  PLT 214 216    No results for input(s): CKTOTAL, CKMB, TROPONINI in the last 72 hours. Invalid input(s): POCBNP No results for input(s): HGBA1C in the last 72 hours.    Weights: Filed Weights   08/17/21 0827 08/17/21 1700  Weight: 122 kg 118.7 kg     Radiology/Studies:  CT ABDOMEN PELVIS WO CONTRAST  Result Date: 08/19/2021 CLINICAL DATA:  HGB dropped without any obvious bleeding, requiring blood transfusion, want to rule out any retroperitoneal bleed. EXAM: CT ABDOMEN AND PELVIS WITHOUT CONTRAST TECHNIQUE: Multidetector CT imaging of the abdomen and pelvis was performed following the standard protocol without IV contrast. COMPARISON:  Contrast enhanced CT 12/28/2018 FINDINGS: Lower chest: Prosthetic aortic valve. Decreased density of the blood pool consistent with anemia. There is mild elevation of left hemidiaphragm. No acute airspace disease or pleural effusion. Bilateral gynecomastia. Hepatobiliary: Mild hepatic  steatosis without focal hepatic lesion on this unenhanced exam. Gallbladder partially distended, no calcified stone. No biliary dilatation. Pancreas: Mild fatty atrophy.  No ductal dilatation or inflammation. Spleen: Normal in size without focal abnormality. Adrenals/Urinary Tract: Normal adrenal glands. No hydronephrosis or renal calculi. There is faint bilateral perinephric edema that is chronic and similar to prior exam. The previous cyst in the lower left kidney is not well delineated on the current exam. Physiologically distended urinary bladder. No bladder wall thickening. Stomach/Bowel: Small hiatal hernia. Stomach is decompressed. There is a small duodenal diverticulum. Occasional fecalization of distal small bowel contents. No small bowel inflammation or obstruction. Normal appendix. Moderate volume of stool throughout the colon. There is no colonic wall thickening or pericolonic edema. Vascular/Lymphatic: Moderate aortic atherosclerosis. No aortic aneurysm. There is no retroperitoneal or portal venous gas. There is no enlargement in the trip air today and suggest retroperitoneal hemorrhage. There is no enlargement of the abdominal wall musculature to suggest rectus sheath hematoma. No hemoperitoneum or free fluid. No enlarged lymph nodes in the abdomen or pelvis. Reproductive: Grossly unremarkable prostate gland, partially obscured by streak artifact from right hip arthroplasty. Other: No free fluid or free air. Tiny fat containing umbilical hernia. There is  fat within the left inguinal canal. No evidence of abdominopelvic hemorrhage or hematoma. No body wall abnormality. Musculoskeletal: Right hip arthroplasty. Posterior lumbar fusion L4 through S1. 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 No acute osseous abnormalities are seen. Remote left eleventh rib fracture. IMPRESSION: 1. No evidence of abdominopelvic hemorrhage or acute abnormality. No retroperitoneal or rectus  sheath hematoma. 2. Moderate colonic stool burden with fecalization of distal small bowel contents, suggesting slow transit. No bowel obstruction or inflammation. 3. Mild hepatic steatosis. Aortic Atherosclerosis (ICD10-I70.0). Electronically Signed   By: Keith Rake M.D.   On: 08/19/2021 17:09   DG Chest 2 View  Result Date: 08/17/2021 CLINICAL DATA:  SOB EXAM: CHEST - 2 VIEW COMPARISON:  None. FINDINGS: Status post prosthetic heart valve. Median sternotomy. Anterior cervical fusion hardware. The heart size is within normal limits. No pleural effusion. No pneumothorax. No mass or consolidation. No acute osseous abnormality. Calcifications of the aortic arch. IMPRESSION: No acute findings in the chest. Electronically Signed   By: Albin Felling M.D.   On: 08/17/2021 09:15     Assessment and Recommendation  75 y.o. male with a past medical history of aortic valve stenosis status post mechanical aortic valve replacement, hypertension, hyperlipidemia, diabetes, lower extremity edema, paroxysmal nonvalvular atrial fibrillation with rapid ventricular rate.  Patient has no current evidence of myocardial infarction or acute coronary syndrome or congestive heart failure at this time.  There is some concern for significant anemia with hemoglobin on admission of 11.7, then dropping to 6.8 requiring transfusion without evidence of overt bleeding complications.  Plan: -Continue with conservative management of atrial fibrillation at this time while patient undergoes diagnostic testing and management for significant anemia.  The patient is at low risk for cardiovascular complication with colonoscopy and endoscopy -Continuation of amiodarone at 200 mg twice per day for possible spontaneous conversion to normal sinus rhythm. -Continue anticoagulation with warfarin for goal INR between 2 and 3 for mechanical aortic valve and atrial fibrillation -Continue metoprolol for heart rate control of atrial fibrillation and  previous diastolic dysfunction heart failure -No further cardiac diagnostics necessary at this time -Continue cholesterol management with pravastatin -Continue torsemide for lower extremity edema which is currently well controlled with -Okay for discharged home from cardiac standpoint with follow-up in 2 weeks Signed, Serafina Royals, MD, [facility]

## 2021-08-22 NOTE — Telephone Encounter (Signed)
Message on patient voicemail to return call for appointment details.  I will also give him a call later today.   Scheduled for [facility] on 09/08/21 to arrive at 7:30 am for 8:30 am appointment.  The IR nurse will call him prior to the procedure to go over the instructions.    Please schedule him for MD follow up 7-10 days after Bone marrow biopsy.

## 2021-08-22 NOTE — Telephone Encounter (Signed)
3rd message left on patient's voicemail to return call for appt details.

## 2021-08-25 ENCOUNTER — Encounter: Payer: Medicare HMO | Attending: Physician Assistant | Admitting: Physician Assistant

## 2021-08-25 ENCOUNTER — Other Ambulatory Visit: Payer: Self-pay

## 2021-08-25 ENCOUNTER — Encounter: Payer: Self-pay | Admitting: Emergency Medicine

## 2021-08-25 DIAGNOSIS — Z952 Presence of prosthetic heart valve: Secondary | ICD-10-CM | POA: Diagnosis not present

## 2021-08-25 DIAGNOSIS — G8929 Other chronic pain: Secondary | ICD-10-CM | POA: Insufficient documentation

## 2021-08-25 DIAGNOSIS — L97421 Non-pressure chronic ulcer of left heel and midfoot limited to breakdown of skin: Secondary | ICD-10-CM | POA: Diagnosis not present

## 2021-08-25 DIAGNOSIS — E1142 Type 2 diabetes mellitus with diabetic polyneuropathy: Secondary | ICD-10-CM | POA: Insufficient documentation

## 2021-08-25 DIAGNOSIS — I4891 Unspecified atrial fibrillation: Secondary | ICD-10-CM | POA: Diagnosis not present

## 2021-08-25 DIAGNOSIS — E11621 Type 2 diabetes mellitus with foot ulcer: Secondary | ICD-10-CM | POA: Insufficient documentation

## 2021-08-25 DIAGNOSIS — Z7901 Long term (current) use of anticoagulants: Secondary | ICD-10-CM | POA: Diagnosis not present

## 2021-08-25 NOTE — Progress Notes (Addendum)
OLAF, MESA ([id number]) Visit Report for 08/25/2021 Chief Complaint Document Details Patient Name: [name], [name]. Date of Service: 08/25/2021 1:00 PM Medical Record Number: [id number] Patient Account Number: [id number] Date of Birth/Sex: 12/16/1945 (75 y.o. M) Treating RN: Donnamarie Poag Primary Care Provider: Fulton Reek Other Clinician: Referring Provider: Fulton Reek Treating Provider/Extender: Skipper Cliche in Treatment: 4 Information Obtained from: Patient Chief Complaint Right LE ulcers and cellulitis Electronic Signature(s) Signed: 08/25/2021 1:03:32 PM By: Worthy Keeler PA-C Entered By: Worthy Keeler on 08/25/2021 13:03:32 [name] ([id number]) -------------------------------------------------------------------------------- Debridement Details Patient Name: [name], [name]. Date of Service: 08/25/2021 1:00 PM Medical Record Number: [id number] Patient Account Number: [id number] Date of Birth/Sex: 04-Oct-1946 (75 y.o. M) Treating RN: Donnamarie Poag Primary Care Provider: Fulton Reek Other Clinician: Referring Provider: Fulton Reek Treating Provider/Extender: Skipper Cliche in Treatment: 4 Debridement Performed for Wound #9 Left Calcaneus Assessment: Performed By: Physician Tommie Sams., PA-C Debridement Type: Debridement Severity of Tissue Pre Debridement: Fat layer exposed Level of Consciousness (Pre- Awake and Alert procedure): Pre-procedure Verification/Time Out Yes - 13:19 Taken: Start Time: 13:19 Pain Control: Lidocaine Total Area Debrided (L x W): 0.5 (cm) x 0.5 (cm) = 0.25 (cm) Tissue and other material Non-Viable, Callus debrided: Level: Non-Viable Tissue Debridement Description: Selective/Open Wound Instrument: Curette Bleeding: None Response to Treatment: Procedure was tolerated well Level of Consciousness (Post- Awake and Alert procedure): Post Debridement Measurements of Total Wound Length: (cm) 1 Width:  (cm) 0.6 Depth: (cm) 0.1 Volume: (cm) 0.047 Character of Wound/Ulcer Post Debridement: Improved Severity of Tissue Post Debridement: Fat layer exposed Post Procedure Diagnosis Same as Pre-procedure Electronic Signature(s) Signed: 08/25/2021 1:59:03 PM By: Donnamarie Poag Signed: 08/25/2021 4:18:05 PM By: Worthy Keeler PA-C Entered By: Donnamarie Poag on 08/25/2021 13:21:09 [name] ([id number]) -------------------------------------------------------------------------------- HPI Details Patient Name: [name], [name]. Date of Service: 08/25/2021 1:00 PM Medical Record Number: [id number] Patient Account Number: [id number] Date of Birth/Sex: 25-Sep-1946 (75 y.o. M) Treating RN: Donnamarie Poag Primary Care Provider: Fulton Reek Other Clinician: Referring Provider: Fulton Reek Treating Provider/Extender: Skipper Cliche in Treatment: 4 History of Present Illness HPI Description: 09/24/2020 on evaluation today patient presents today for a heel ulcer that he tells me has been present for about 2 years. He has been seeing podiatry and they have been attempting to manage this including what sounds to be a total contact cast, Unna boot, and just standard dressings otherwise as well. Most recently has been using triple antibiotic ointment. With that being said unfortunately despite everything he really has not had any significant improvement. He tells me that he cannot even really remember exactly how this began but he presumed it may have rubbed on his shoes or something of that nature. 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 He tells me that this does seem to help. Fortunately there is no signs of active  infection at this time. His most recent hemoglobin A1c was 8.1 though he knows this was this year he cannot tell me the exact time. His fluid pills currently to help with some of the lower extremity edema although he does obviously have signs of venous stasis/lymphedema. Currently there is no evidence of active infection. No  fevers, chills, nausea, vomiting, or diarrhea. Patient has had fairly recent ABIs which were performed on 07/19/2020 and revealed that he has normal findings in both the ankle and toe locations bilaterally. His ABI on the right was 1.09 on the left was 1.08 with a TBI on the right of 0.88 and on the left of 0.94. Triphasic flow was noted throughout. 10/08/2020 on evaluation today patient appears to be doing pretty well in regard to his left heel currently in fact this is doing a great job and seems to be healing quite nicely. Unfortunately on his right leg he had a pile of wood that actually fell on him injuring his right leg this is somewhat erythematous has me concerned little bit about cellulitis though there is not really a good area to culture at this point. 10/24/2020 upon evaluation today patient appears to be doing well with regard to his heel ulcer. He is showing signs of improvement which is great news. His right leg is completely healed. Overall I feel like he is doing excellent and there is no signs of infection. 11/07/2020 upon evaluation today patient appears to be doing well with regard to his heel ulcer. He tells me that last week when he was unable to come in his wife actually thought that the wound was very close to closing if not closed. Then it began to "reopen again". I really feel like what may have happened as the collagen may have dried over the wound bed and that because that misunderstanding with thinking that the wound was healing. With that being said I did not see it last week I do not know that for certain. Either way I feel like he is doing great today I  see no signs of infection at this point. 11/26/2020 upon inspection today patient appears to be doing decently well in regard to his heel ulcer. He has been tolerating the dressing changes without complication. Fortunately there is no sign of active infection at this time. No fevers, chills, nausea, vomiting, or diarrhea. 12/10/2020 upon evaluation today patient appears to be doing fairly well in regard to the wound on his heel as well as what appears to be a new wound of the left first metatarsal head plantar aspect. This seems to be an area that was callus that has split as the patient tells me has been trying to walk on his toes more has probably where this came from. With that being said there does not appear to be signs of active infection which is great news. 12/17/2020 upon evaluation today patient actually appears to be making good progress currently. Fortunately there is no evidence of active infection at this time. Overall I feel like he is very close to complete closure. 12/26/2020 upon evaluation today patient appears to be doing excellent in regard to his wounds. In fact I am not certain that these are not even completely healed on initial inspection. Overall I am very pleased with where things stand today. Good news is that they are healed he is actually get ready to go out of town and that will be helpful as well as he will be a full part of the time. Readmission: 02/04/2021 upon evaluation today patient appears to be doing well at this point in regard to his left heel that I previously saw him for. Unfortunately he is having issues with his right lower extremity. He has significant wounds at this point he also has erythema noted there is definite signs of cellulitis  which is unfortunate. With that being said I think we do need to address this sooner rather than later. The good news is he did have a nice trip to the beach. He tells me that he had no issues during that time. 4/27; patient on  Bactrim. Culture showed methicillin sensitive staph aureus therefore the Bactrim should be effective. 2 small areas on the right leg are healed the area on the mid aspect of the tibia almost 100% covered in a very adherent necrotic debris. We have been using silver alginate 02/20/2021 upon evaluation today patient appears to be doing well with regard to his wound. He is showing signs of improvement which is great news overall very pleased with where things stand today. No fevers, chills, nausea, vomiting, or diarrhea. 02/27/2021 upon evaluation today patient appears to be doing well with regard to his leg ulcer. He is tolerating the dressing changes and overall appears to be doing quite excellent. I am extremely pleased with where things stand and overall I think patient is making great progress. There is no sign of active infection at this time which is also great news. 03/06/2021 upon evaluation today patient appears to be doing excellent in regard to his wounds. In fact he appears to be completely healed today based on what I am seeing. This is excellent news and overall I am extremely pleased with where he stands. Overall the patient is happy to hear this as well this has been quite sometime coming. Readmission: 04/01/2021 patient unfortunately returns for readmission today. He tells me that he has been wearing his compression socks on the right leg daily [name], [name] ([id number]) and he does not really know what is going on and why his legs are doing what they are doing. We did therefore go ahead and probe deeper into exactly what has been going on with him today. Subsequently the patient tells me that when he gets up and what we would call "first thing in the morning" are really 2 different things". He does not tend to sleep well so he tells me that he will often wake up at 3:00 in the morning. He will then potentially going into the living room to get in his chair where he may read a book for  a little while and then potentially fall asleep back in his chair. He then subsequently wake up around 5:00 or so and then get up and in his words "putter around". This often will end with him proceeding at some point around 8 AM or 9 AM to putting on his compression socks. With that being said this means that anywhere from the 3:00 in the morning till roughly around 9:00 in the morning he has no compression on yet he is sitting in his recliner, walking around and up and about, and this is at least 5 to 6 hours of noncompressed time. That may be our issue here but I did not realize until we discussed this further today. Fortunately there does not appear to be any signs of active infection at this time which is great news. With that being said he has multiple wounds of the bilateral lower extremities. 04/10/2021 upon evaluation today patient appears to be doing well with regard to his legs for the most part. He does look like he had some injury where his wrap slid down nonetheless he did some "doctoring on them". I do feel like most of the areas honestly have cleared back up which is great news. There  does not appear to be any signs of active infection at this time which is also great news. No fevers, chills, nausea, vomiting, or diarrhea. 04/18/2021 upon evaluation today patient appears to be doing well with regard to his wounds in fact he appears to be completely healed which is great news. Fortunately there does not appear to be any signs of active infection which is great news. No fevers, chills, nausea, vomiting, or diarrhea. READMISSION 07/28/2021 This is a now 75 year old man we have had in this clinic for quite a bit of this year. 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 He wears compression stockings. 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 He comes in an old running shoes  although he says he wears better shoes at home. His history is that he noticed a blister in the right posterior heel. This open. He has been applying Neosporin to it currently the wound measures 2 x 1.4 cm. 100% slough covered. Not really offloading this in any rigorous way. Past medical history is essentially unchanged he has paroxysmal atrial fibrillation on Coumadin type 2 diabetes with a recent hemoglobin A1c of 7 on metformin. ABI in our clinic was 0.98 on the right 08/05/2021 upon evaluation today patient appears to be doing well with regard to his heel ulcer. Fortunately there does not appear to be any signs of active infection at this time. Overall I been very pleased with where things seem to be currently as far as the wound healing is concerned. Again this is first a lot of seeing him Dr. Dellia Nims readmitted him last week. Nonetheless I think we are definitely making some progress here. 08/11/2021 upon evaluation today patient's wound on the heel actually showing signs of excellent improvement. I am actually very pleased with where things stand currently. No fevers, chills, nausea, vomiting, or diarrhea. There does not appear to be any need for sharp debridement today either which is also great news. 08/25/2021 upon evaluation today patient appears to be doing well with regard to his heel ulcer. This is actually looking significantly improved compared to last time I saw him. Fortunately there does not appear to be any evidence of active infection at this time. No fevers, chills, nausea, vomiting, or diarrhea. Electronic Signature(s) Signed: 08/25/2021 1:29:08 PM By: Worthy Keeler PA-C Entered By: Worthy Keeler on 08/25/2021 13:29:08 [name] ([id number]) -------------------------------------------------------------------------------- Physical Exam Details Patient Name: [name], [name] Date of Service: 08/25/2021 1:00 PM Medical Record Number: [id number] Patient Account  Number: [id number] Date of Birth/Sex: Nov 12, 1945 (75 y.o. M) Treating RN: Donnamarie Poag Primary Care Provider: Fulton Reek Other Clinician: Referring Provider: Fulton Reek Treating Provider/Extender: Skipper Cliche in Treatment: 4 Constitutional Well-nourished and well-hydrated in no acute distress. Respiratory normal breathing without difficulty. Psychiatric this patient is able to make decisions and demonstrates good insight into disease process. Alert and Oriented x 3. pleasant and cooperative. Notes Patient's wound bed showed signs of good granulation and epithelization at this point. Fortunately there does not appear to be any evidence of active infection systemically which is great news and overall I am extremely pleased with where things stand today. No fevers, chills, nausea, vomiting, or diarrhea. Unfortunately he was in the hospital due to atrial fibrillation and having a difficult time breathing. This was from 08/17/2021 through 08/21/2021. He did have a blood transfusion during that time as well. Nonetheless today he seems to be  doing better though he tells me he is definitely not back to his normal self. Electronic Signature(s) Signed: 08/25/2021 1:30:03 PM By: Worthy Keeler PA-C Entered By: Worthy Keeler on 08/25/2021 13:30:03 [name] ([id number]) -------------------------------------------------------------------------------- Physician Orders Details Patient Name: [name], [name]. Date of Service: 08/25/2021 1:00 PM Medical Record Number: [id number] Patient Account Number: [id number] Date of Birth/Sex: 29-[name]-1946 (76 y.o. 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 o Nurse Visit as needed - call to schedule Bathing/ Shower/ Hygiene o May shower with wound dressing protected with water repellent cover or cast protector. o No tub bath. Edema Control - Lymphedema / Segmental Compressive Device / Other o Patient to wear own compression stockings. Remove compression stockings every night before going to bed and put on every morning when getting up. - right leg o Elevate leg(s) parallel to the floor when sitting. o DO YOUR BEST to sleep in the bed at night. DO NOT sleep in your recliner. Long hours of sitting in a recliner leads to swelling of the legs and/or potential wounds on your backside. Off-Loading o Other: - L heel offloader shoe Keep pressure off of heel Medications-Please add to medication list. o Other: - check blood sugar and eat protein for wound healing Wound Treatment Wound #9 - Calcaneus Wound Laterality: Left Cleanser: Normal Saline Every Other Day/15 Days Discharge Instructions: Wash your hands with soap and water. Remove old dressing, discard into plastic bag and place into trash. Cleanse the wound with Normal Saline prior to applying a clean dressing using gauze sponges, not tissues or cotton balls. Do not scrub or use excessive force. Pat dry using gauze sponges, not tissue or cotton balls. Primary Dressing: Silvercel Small 2x2 (in/in) (Generic) Every Other Day/15 Days Discharge Instructions: Apply Silvercel Small 2x2 (in/in) as instructed Secondary Dressing: Gauze (Generic) Every Other Day/15 Days Secondary Dressing: heel cup Every Other Day/15 Days Discharge Instructions: apply over dressing to protect heel Secured With: 12M Medipore H Soft Cloth Surgical Tape, 2x2 (in/yd) (Generic) Every Other Day/15 Days Secured With: Coban  Cohesive Bandage 4x5 (yds) Stretched Every Other Day/15 Days Discharge Instructions: Apply Coban as directed. Secured With: [employer] or Non-Sterile 6-ply 4.5x4 (yd/yd) (Generic) Every Other Day/15 Days Discharge Instructions: Apply Kerlix as directed Electronic Signature(s) [name], [name] ([id number]) Signed: 08/25/2021 1:59:03 PM By: Donnamarie Poag Signed: 08/25/2021 4:18:05 PM By: Worthy Keeler PA-C Entered By: Donnamarie Poag on 08/25/2021 13:29:13 [name] ([id number]) -------------------------------------------------------------------------------- Problem List Details Patient Name: ANTONIOUS, OMAHONEY. Date of Service: 08/25/2021 1:00 PM Medical Record Number: [id number] Patient Account Number: [id number] Date of Birth/Sex: Aug 15, 1946 (75 y.o. 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 Date of Service: 08/25/2021 1:00 PM Medical Record Number: [id number] Patient Account Number: [id number] Date of Birth/Sex: 07/15/1946 (75 y.o. M) Treating RN: Donnamarie Poag Primary Care Provider: Fulton Reek Other Clinician: Referring Provider: Fulton Reek Treating Provider/Extender:  Skipper Cliche in Treatment: 4 Subjective Chief Complaint Information obtained from Patient Right LE ulcers and cellulitis History of Present Illness (HPI) 09/24/2020 on evaluation today patient presents today for a heel ulcer that he tells me has been present for about 2 years. He has been seeing podiatry and they have been attempting to manage this including what sounds to be a total contact cast, Unna boot, and just standard dressings otherwise as well. Most recently has been using triple antibiotic ointment. With that being said unfortunately despite everything he really has not had any significant improvement. He tells me that he cannot even really remember exactly how this began but he presumed it may have rubbed on his shoes or something of that nature. 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 He tells me that this does seem to help. Fortunately there is no signs of active infection at this time. His most recent hemoglobin A1c was 8.1 though he knows this was this year he cannot tell me the exact time. His fluid pills currently to help with some of the lower extremity edema although he does obviously have signs of venous stasis/lymphedema. Currently there is no evidence of active infection. No fevers, chills, nausea, vomiting, or diarrhea. Patient has had fairly recent ABIs which were performed on 07/19/2020 and revealed that he has normal findings in both the ankle and toe locations bilaterally. His ABI on the right was 1.09 on the left was 1.08 with a TBI on the right of 0.88 and on the left of 0.94. Triphasic flow was noted throughout. 10/08/2020 on evaluation today patient appears to be doing pretty well  in regard to his left heel currently in fact this is doing a great job and seems to be healing quite nicely. Unfortunately on his right leg he had a pile of wood that actually fell on him injuring his right leg this is somewhat erythematous has me concerned little bit about cellulitis though there is not really a good area to culture at this point. 10/24/2020 upon evaluation today patient appears to be doing well with regard to his heel ulcer. He is showing signs of improvement which is great news. His right leg is completely healed. Overall I feel like he is doing excellent and there is no signs of infection. 11/07/2020 upon evaluation today patient appears to be doing well with regard to his heel ulcer. He tells me that last week when he was unable to come in his wife actually thought that the wound was very close to closing if not closed. Then it began to "reopen again". I really feel like what may have happened as the collagen may have dried over the wound bed and that because that misunderstanding with thinking that the wound was healing. With that being said I did not see it last week  I do not know that for certain. Either way I feel like he is doing great today I see no signs of infection at this point. 11/26/2020 upon inspection today patient appears to be doing decently well in regard to his heel ulcer. He has been tolerating the dressing changes without complication. Fortunately there is no sign of active infection at this time. No fevers, chills, nausea, vomiting, or diarrhea. 12/10/2020 upon evaluation today patient appears to be doing fairly well in regard to the wound on his heel as well as what appears to be a new wound of the left first metatarsal head plantar aspect. This seems to be an area that was callus that has split as the patient tells me has been trying to walk on his toes more has probably where this came from. With that being said there does not appear to be signs of active  infection which is great news. 12/17/2020 upon evaluation today patient actually appears to be making good progress currently. Fortunately there is no evidence of active infection at this time. Overall I feel like he is very close to complete closure. 12/26/2020 upon evaluation today patient appears to be doing excellent in regard to his wounds. In fact I am not certain that these are not even completely healed on initial inspection. Overall I am very pleased with where things stand today. Good news is that they are healed he is actually get ready to go out of town and that will be helpful as well as he will be a full part of the time. Readmission: 02/04/2021 upon evaluation today patient appears to be doing well at this point in regard to his left heel that I previously saw him for. Unfortunately he is having issues with his right lower extremity. He has significant wounds at this point he also has erythema noted there is definite signs of cellulitis which is unfortunate. With that being said I think we do need to address this sooner rather than later. The good news is he did have a nice trip to the beach. He tells me that he had no issues during that time. 4/27; patient on Bactrim. Culture showed methicillin sensitive staph aureus therefore the Bactrim should be effective. 2 small areas on the right leg are healed the area on the mid aspect of the tibia almost 100% covered in a very adherent necrotic debris. We have been using silver alginate 02/20/2021 upon evaluation today patient appears to be doing well with regard to his wound. He is showing signs of improvement which is great news overall very pleased with where things stand today. No fevers, chills, nausea, vomiting, or diarrhea. 02/27/2021 upon evaluation today patient appears to be doing well with regard to his leg ulcer. He is tolerating the dressing changes and overall appears to be doing quite excellent. I am extremely pleased with where  things stand and overall I think patient is making great progress. There is no sign of active infection at this time which is also great news. 03/06/2021 upon evaluation today patient appears to be doing excellent in regard to his wounds. In fact he appears to be completely healed today based on what I am seeing. This is excellent news and overall I am extremely pleased with where he stands. Overall the patient is happy to hear [name], [name] ([id number]) this as well this has been quite sometime coming. Readmission: 04/01/2021 patient unfortunately returns for readmission today. He tells me that he has been wearing his compression socks  on the right leg daily and he does not really know what is going on and why his legs are doing what they are doing. We did therefore go ahead and probe deeper into exactly what has been going on with him today. Subsequently the patient tells me that when he gets up and what we would call "first thing in the morning" are really 2 different things". He does not tend to sleep well so he tells me that he will often wake up at 3:00 in the morning. He will then potentially going into the living room to get in his chair where he may read a book for a little while and then potentially fall asleep back in his chair. He then subsequently wake up around 5:00 or so and then get up and in his words "putter around". This often will end with him proceeding at some point around 8 AM or 9 AM to putting on his compression socks. With that being said this means that anywhere from the 3:00 in the morning till roughly around 9:00 in the morning he has no compression on yet he is sitting in his recliner, walking around and up and about, and this is at least 5 to 6 hours of noncompressed time. That may be our issue here but I did not realize until we discussed this further today. Fortunately there does not appear to be any signs of active infection at this time which is great news. With  that being said he has multiple wounds of the bilateral lower extremities. 04/10/2021 upon evaluation today patient appears to be doing well with regard to his legs for the most part. He does look like he had some injury where his wrap slid down nonetheless he did some "doctoring on them". I do feel like most of the areas honestly have cleared back up which is great news. There does not appear to be any signs of active infection at this time which is also great news. No fevers, chills, nausea, vomiting, or diarrhea. 04/18/2021 upon evaluation today patient appears to be doing well with regard to his wounds in fact he appears to be completely healed which is great news. Fortunately there does not appear to be any signs of active infection which is great news. No fevers, chills, nausea, vomiting, or diarrhea. READMISSION 07/28/2021 This is a now 75 year old man we have had in this clinic for quite a bit of this year. 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 He wears compression stockings. 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 He comes in an old running shoes although he says he wears better shoes at home. His history is that he noticed a blister in the right posterior heel. This open. He has been applying Neosporin to it currently the wound measures 2 x 1.4 cm. 100% slough covered. Not really offloading this in any rigorous way. Past medical history is essentially unchanged he has paroxysmal atrial fibrillation on Coumadin type 2 diabetes with a recent hemoglobin A1c of 7 on metformin. ABI in our clinic was 0.98 on the right 08/05/2021 upon evaluation today patient appears to be doing well with regard to his heel ulcer. Fortunately there does not appear to be any signs of active infection at this time. Overall I been very pleased with where things seem to be currently as far as the  wound healing is concerned. Again this is first a  lot of seeing him Dr. Dellia Nims readmitted him last week. Nonetheless I think we are definitely making some progress here. 08/11/2021 upon evaluation today patient's wound on the heel actually showing signs of excellent improvement. I am actually very pleased with where things stand currently. No fevers, chills, nausea, vomiting, or diarrhea. There does not appear to be any need for sharp debridement today either which is also great news. 08/25/2021 upon evaluation today patient appears to be doing well with regard to his heel ulcer. This is actually looking significantly improved compared to last time I saw him. Fortunately there does not appear to be any evidence of active infection at this time. No fevers, chills, nausea, vomiting, or diarrhea. Objective Constitutional Well-nourished and well-hydrated in no acute distress. Vitals Time Taken: 3:10 PM, Height: 69 in, Weight: 267 lbs, BMI: 39.4, Temperature: 98.5 F, Pulse: 71 bpm, Respiratory Rate: 16 breaths/min, Blood Pressure: 149/78 mmHg. Respiratory normal breathing without difficulty. Psychiatric this patient is able to make decisions and demonstrates good insight into disease process. Alert and Oriented x 3. pleasant and cooperative. General Notes: Patient's wound bed showed signs of good granulation and epithelization at this point. Fortunately there does not appear to be any evidence of active infection systemically which is great news and overall I am extremely pleased with where things stand today. No fevers, chills, [name], [name]. ([id number]) nausea, vomiting, or diarrhea. Unfortunately he was in the hospital due to atrial fibrillation and having a difficult time breathing. This was from 08/17/2021 through 08/21/2021. He did have a blood transfusion during that time as well. Nonetheless today he seems to be doing better though he tells me he is definitely not back to his normal  self. Integumentary (Hair, Skin) Wound #9 status is Open. Original cause of wound was Gradually Appeared. The date acquired was: 07/07/2021. The wound has been in treatment 4 weeks. The wound is located on the Left Calcaneus. The wound measures 1cm length x 0.6cm width x 0.1cm depth; 0.471cm^2 area and 0.047cm^3 volume. There is Fat Layer (Subcutaneous Tissue) exposed. There is no tunneling or undermining noted. There is a medium amount of serosanguineous drainage noted. The wound margin is thickened. There is large (67-100%) red, pink granulation within the wound bed. There is a small (1-33%) amount of necrotic tissue within the wound bed including Adherent Slough. 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 There was a Selective/Open Wound Non-Viable Tissue Debridement with a total area of 0.25 sq cm performed by Tommie Sams., PA-C. With the following instrument(s): Curette to remove Non-Viable tissue/material. Material removed includes Callus after achieving pain control using Lidocaine. A time out was conducted at 13:19, prior to the start of the procedure. There was no bleeding. The procedure was tolerated well. Post Debridement Measurements: 1cm length x 0.6cm width x 0.1cm depth; 0.047cm^3 volume. Character of Wound/Ulcer Post Debridement is improved. Severity of Tissue Post Debridement is: Fat layer exposed. Post procedure Diagnosis Wound #9: Same as Pre-Procedure Plan Follow-up Appointments: Return Appointment in 1 week. Nurse Visit as needed - call to schedule Bathing/ Shower/ Hygiene: May shower with wound dressing protected  with water repellent cover or cast protector. No tub bath. Edema  Control - Lymphedema / Segmental Compressive Device / Other: Patient to wear own compression stockings. Remove compression stockings every night before going to bed and put on every morning when getting up. - right leg Elevate leg(s) parallel to the floor when sitting. DO YOUR BEST to sleep in the bed at night. DO NOT sleep in your recliner. Long hours of sitting in a recliner leads to swelling of the legs and/or potential wounds on your backside. Off-Loading: Other: - L heel offloader shoe Keep pressure off of heel Medications-Please add to medication list.: Other: - check blood sugar and eat protein for wound healing WOUND #9: - Calcaneus Wound Laterality: Left Cleanser: Normal Saline Every Other Day/15 Days Discharge Instructions: Wash your hands with soap and water. Remove old dressing, discard into plastic bag and place into trash. Cleanse the wound with Normal Saline prior to applying a clean dressing using gauze sponges, not tissues or cotton balls. Do not scrub or use excessive force. Pat dry using gauze sponges, not tissue or cotton balls. Primary Dressing: Silvercel Small 2x2 (in/in) (Generic) Every Other Day/15 Days Discharge Instructions: Apply Silvercel Small 2x2 (in/in) as instructed Secondary Dressing: Gauze (Generic) Every Other Day/15 Days Secondary Dressing: heel cup Every Other Day/15 Days Discharge Instructions: apply over dressing to protect heel Secured With: 51M Medipore H Soft Cloth Surgical Tape, 2x2 (in/yd) (Generic) Every Other Day/15 Days Secured With: Coban Cohesive Bandage 4x5 (yds) Stretched Every Other Day/15 Days Discharge Instructions: Apply Coban as directed. [name], [name] ([id number]) Secured With: Delmar Landau Sterile or Non-Sterile 6-ply 4.5x4 (yd/yd) (Generic) Every Other Day/15 Days Discharge Instructions: Apply Kerlix as directed 1. Would recommend currently that we going to  continue with the wound care measures as before as far as the heel is concerned I think that he is doing quite well I was able to get a lot of the callus off today as well and we are going to continue with the silver alginate dressing. 2. We will continue to cover this with gauze and then a heel cup followed by roll gauze to secure in place which I think is doing a good job at helping him keep pressure off of the heel. I am not getting around and moving as much because of the anemia having been in the hospital also I think helped his heel dramatically this is significantly improved. We will see patient back for reevaluation in 1 week here in the clinic. If anything worsens or changes patient will contact our office for additional recommendations. Electronic Signature(s) Signed: 08/25/2021 1:31:06 PM By: Worthy Keeler PA-C Entered By: Worthy Keeler on 08/25/2021 13:31:05 [name] ([id number]) -------------------------------------------------------------------------------- SuperBill Details Patient Name: [name], [name]. Date of Service: 08/25/2021 Medical Record Number: [id number] Patient Account Number: [id number] Date of Birth/Sex: 1946/04/17 (75 y.o. M) Treating RN: Donnamarie Poag Primary Care Provider: Fulton Reek Other Clinician: Referring Provider: Fulton Reek Treating Provider/Extender: Skipper Cliche in Treatment: 4 Diagnosis Coding ICD-10 Codes Code Description E11.621 Type 2 diabetes mellitus with foot ulcer L97.421 Non-pressure chronic ulcer of left heel and midfoot limited to breakdown of skin E11.42 Type 2 diabetes mellitus with diabetic polyneuropathy Facility Procedures CPT4 Code: 96283662 Description: [id number] - DEBRIDE WOUND 1ST 20 SQ CM OR < Modifier: Quantity: 1 CPT4 Code: Description: ICD-10 Diagnosis Description L97.421 Non-pressure chronic ulcer of left heel and midfoot limited to breakdown Modifier: of skin Quantity: Physician  Procedures CPT4 Code: [id number] Description: 99214 - WC PHYS LEVEL 4 - EST PT Modifier: 25  Quantity: 1 CPT4 Code: Description: ICD-10 Diagnosis Description E11.621 Type 2 diabetes mellitus with foot ulcer L97.421 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

## 2021-08-25 NOTE — Telephone Encounter (Signed)
Attempted to call pt regarding upcoming appointment information with no success. Mychart message sent to pt as well.

## 2021-08-26 NOTE — Progress Notes (Signed)
[name], [name] ([id number]) Visit Report for 08/25/2021 Arrival Information Details Patient Name: [name], [name]. Date of Service: 08/25/2021 1:00 PM Medical Record Number: [id number] Patient Account Number: [id number] Date of Birth/Sex: 1946/05/11 (75 y.o. M) Treating RN: Donnamarie Poag Primary Care Thijs Brunton: Fulton Reek Other Clinician: Referring Nihar Klus: Fulton Reek Treating Alyene Predmore/Extender: Skipper Cliche in Treatment: 4 Visit Information History Since Last Visit Added or deleted any medications: Yes Patient Arrived: [name] Had a fall or experienced change in No Arrival Time: 13:05 activities of daily living that may affect Accompanied By: self risk of falls: Transfer Assistance: None Hospitalized since last visit: Yes Patient Identification Verified: Yes Has Dressing in Place as Prescribed: Yes Secondary Verification Process Completed: Yes Pain Present Now: No Patient Has Alerts: Yes Patient Alerts: Patient on Blood Thinner Coumadin Diabetic aspirin 1m Electronic Signature(s) Signed: 08/25/2021 1:59:03 PM By: BDonnamarie PoagEntered By: BDonnamarie Poagon 08/25/2021 13:09:17 [name]([id number] -------------------------------------------------------------------------------- Clinic Level of Care Assessment Details Patient Name: [name] Date of Service: 08/25/2021 1:00 PM Medical Record Number: [id number] Account Number: [id number] of Birth/Sex: 9May 27, 1947(75y.o. M) Treating RN: BDonnamarie PoagPrimary Care Doloris Servantes: SFulton ReekOther Clinician: Referring Loomis Anacker: SFulton ReekTreating Vickee Mormino/Extender: SSkipper Clichein Treatment: 4 Clinic Level of Care Assessment Items TOOL 1 Quantity Score [] - Use when EandM and Procedure is performed on INITIAL visit 0 ASSESSMENTS - Nursing Assessment / Reassessment [] - General Physical Exam (combine w/ comprehensive assessment (listed just below) when performed on new 0 pt.  evals) [] - 0 Comprehensive Assessment (HX, ROS, Risk Assessments, Wounds Hx, etc.) ASSESSMENTS - Wound and Skin Assessment / Reassessment [] - Dermatologic / Skin Assessment (not related to wound area) 0 ASSESSMENTS - Ostomy and/or Continence Assessment and Care [] - Incontinence Assessment and Management 0 [] - 0 Ostomy Care Assessment and Management (repouching, etc.) PROCESS - Coordination of Care [] - Simple Patient / Family Education for ongoing care 0 [] - 0 Complex (extensive) Patient / Family Education for ongoing care [] - 0 Staff obtains Consents, Records, Test Results / Process Orders [] - 0 Staff telephones HHA, Nursing Homes / Clarify orders / etc [] - 0 Routine Transfer to another Facility (non-emergent condition) [] - 0 Routine Hospital Admission (non-emergent condition) [] - 0 New Admissions / IBiomedical engineer/ Ordering NPWT, Apligraf, etc. [] - 0 Emergency Hospital Admission (emergent condition) PROCESS - Special Needs [] - Pediatric / Minor Patient Management 0 [] - 0 Isolation Patient Management [] - 0 Hearing / Language / Visual special needs [] - 0 Assessment of Community assistance (transportation, D/C planning, etc.) [] - 0 Additional assistance / Altered mentation [] - 0 Support Surface(s) Assessment (bed, cushion, seat, etc.) INTERVENTIONS - Miscellaneous [] - External ear exam 0 [] - 0 Patient Transfer (multiple staff / HCivil Service fast streamer/ Similar devices) [] - 0 Simple Staple / Suture removal (25 or less) [] - 0 Complex Staple / Suture removal (26 or more) [] - 0 Hypo/Hyperglycemic Management (do not check if billed separately) [] - 0 Ankle / Brachial Index (ABI) - do not check if billed separately Has the patient been seen at the hospital within the last three years: Yes Total Score: 0 Level Of Care: ____ [name]([id number] Electronic Signature(s) Signed: 08/25/2021 1:59:03 PM By: BDonnamarie PoagEntered By: BDonnamarie Poagon  08/25/2021 13:33:22 [name]([id number] -------------------------------------------------------------------------------- Encounter Discharge Information Details Patient Name: [name], [name] Date of  Service: 08/25/2021 1:00 PM Medical Record Number: [id number] Patient Account Number: [id number] Date of Birth/Sex: 1946/08/18 (75 y.o. M) Treating RN: Donnamarie Poag Primary Care : Fulton Reek Other Clinician: Referring : Fulton Reek Treating /Extender: Skipper Cliche in Treatment: 4 Encounter Discharge Information Items Post Procedure Vitals Discharge Condition: Stable Temperature (F): 98.5 Ambulatory Status: Cane Pulse (bpm): 71 Discharge Destination: Home Respiratory Rate (breaths/min): 16 Transportation: Private Auto Blood Pressure (mmHg): 149/78 Accompanied By: self Schedule Follow-up Appointment: Yes Clinical Summary of Care: Electronic Signature(s) Signed: 08/25/2021 1:59:03 PM By: Donnamarie Poag Entered By: Donnamarie Poag on 08/25/2021 13:35:18 [name] ([id number]) -------------------------------------------------------------------------------- Lower Extremity Assessment Details Patient Name: [name], [name]. Date of Service: 08/25/2021 1:00 PM Medical Record Number: [id number] Patient Account Number: [id number] Date of Birth/Sex: 1945/12/13 (75 y.o. M) Treating RN: Donnamarie Poag Primary Care : Fulton Reek Other Clinician: Referring : Fulton Reek Treating /Extender: Jeri Cos Weeks in Treatment: 4 Edema Assessment Assessed: [Left: Yes] [Right: No] Edema: [Left: N] [Right: o] Vascular Assessment Pulses: Dorsalis Pedis Palpable: [Left:Yes] Electronic Signature(s) Signed: 08/25/2021 1:59:03 PM By: Donnamarie Poag Entered By: Donnamarie Poag on 08/25/2021 13:16:09 [name] ([id number]) -------------------------------------------------------------------------------- Multi Wound Chart  Details Patient Name: [name], [name]. Date of Service: 08/25/2021 1:00 PM Medical Record Number: [id number] Patient Account Number: [id number] Date of Birth/Sex: [birth year] (75 y.o. M) Treating RN: Donnamarie Poag Primary Care : Fulton Reek Other Clinician: Referring : Fulton Reek Treating /Extender: Skipper Cliche in Treatment: 4 Vital Signs Height(in): 91 Pulse(bpm): 57 Weight(lbs): 9 Blood Pressure(mmHg): 149/78 Body Mass Index(BMI): 39 Temperature(F): 98.5 Respiratory Rate(breaths/min): 16 Photos: [N/A:N/A] Wound Location: Left Calcaneus N/A N/A Wounding Event: Gradually Appeared N/A N/A Primary Etiology: Diabetic Wound/Ulcer of the Lower N/A N/A Extremity Comorbid History: Cataracts, Arrhythmia, Coronary N/A N/A Artery Disease, Hypertension, Type II Diabetes, Osteoarthritis, Neuropathy Date Acquired: 07/07/2021 N/A N/A Weeks of Treatment: 4 N/A N/A Wound Status: Open N/A N/A Measurements L x W x D (cm) 1x0.6x0.1 N/A N/A Area (cm) : 0.471 N/A N/A Volume (cm) : 0.047 N/A N/A % Reduction in Area: 78.60% N/A N/A % Reduction in Volume: 78.60% N/A N/A Classification: Grade 2 N/A N/A Exudate Amount: Medium N/A N/A Exudate Type: Serosanguineous N/A N/A Exudate Color: red, brown N/A N/A Wound Margin: Thickened N/A N/A Granulation Amount: Large (67-100%) N/A N/A Granulation Quality: Red, Pink N/A N/A Necrotic Amount: Small (1-33%) N/A N/A Exposed Structures: Fat Layer (Subcutaneous Tissue): N/A N/A Yes Fascia: No Tendon: No Muscle: No Joint: No Bone: No Treatment Notes Electronic Signature(s) Signed: 08/25/2021 1:59:03 PM By: Donnamarie Poag Entered By: Donnamarie Poag on 08/25/2021 13:18:29 [name] ([id number]) -------------------------------------------------------------------------------- [place] Details Patient Name: [name], [name]. Date of Service: 08/25/2021 1:00 PM Medical Record Number:  [id number] Patient Account Number: [id number] Date of Birth/Sex: [birth year] (75 y.o. M) Treating RN: Donnamarie Poag Primary Care : Fulton Reek Other Clinician: Referring : Fulton Reek Treating /Extender: Skipper Cliche in Treatment: 4 Active Inactive Wound/Skin Impairment Nursing Diagnoses: Impaired tissue integrity Knowledge deficit related to smoking impact on wound healing Knowledge deficit related to ulceration/compromised skin integrity Goals: Patient/caregiver will verbalize understanding of skin care regimen Date Initiated: 07/28/2021 Date Inactivated: 08/11/2021 Target Resolution Date: 08/08/2021 Goal Status: Met Ulcer/skin breakdown will have a volume reduction of 30% by week 4 Date Initiated: 07/28/2021 Target Resolution Date: 08/28/2021 Goal Status: Active Ulcer/skin breakdown will have a volume reduction of 50% by week 8 Date Initiated: 07/28/2021 Target Resolution Date: 09/27/2021 Goal Status: Active Ulcer/skin breakdown will have  a volume reduction of 80% by week 12 Date Initiated: 07/28/2021 Target Resolution Date: 10/28/2020 Goal Status: Active Ulcer/skin breakdown will heal within 14 weeks Date Initiated: 07/28/2021 Target Resolution Date: 11/12/2020 Goal Status: Active Interventions: Assess patient/caregiver ability to obtain necessary supplies Assess patient/caregiver ability to perform ulcer/skin care regimen upon admission and as needed Assess ulceration(s) every visit Notes: Electronic Signature(s) Signed: 08/25/2021 1:59:03 PM By: Donnamarie Poag Entered By: Donnamarie Poag on 08/25/2021 13:16:18 [name] ([id number]) -------------------------------------------------------------------------------- Pain Assessment Details Patient Name: [name]. Date of Service: 08/25/2021 1:00 PM Medical Record Number: [id number] Patient Account Number: [id number] Date of Birth/Sex: 13-Oct-1946 (75 y.o. M) Treating RN: Donnamarie Poag Primary Care : Fulton Reek Other Clinician: Referring : Fulton Reek Treating /Extender: Skipper Cliche in Treatment: 4 Active Problems Location of Pain Severity and Description of Pain Patient Has Paino No Site Locations Rate the pain. Current Pain Level: 0 Pain Management and Medication Current Pain Management: Electronic Signature(s) Signed: 08/25/2021 1:59:03 PM By: Donnamarie Poag Entered By: Donnamarie Poag on 08/25/2021 13:10:57 [name] ([id number]) -------------------------------------------------------------------------------- Patient/Caregiver Education Details Patient Name: [name], [name]. Date of Service: 08/25/2021 1:00 PM Medical Record Number: [id number] Patient Account Number: [id number] Date of Birth/Gender: 12/22/1945 (75 y.o. M) Treating RN: Donnamarie Poag Primary Care Physician: Fulton Reek Other Clinician: Referring Physician: Fulton Reek Treating Physician/Extender: Skipper Cliche in Treatment: 4 Education Assessment Education Provided To: Patient Education Topics Provided Basic Hygiene: Elevated Blood Sugar/ Impact on Healing: Nutrition: Offloading: Wound Debridement: Wound/Skin Impairment: Electronic Signature(s) Signed: 08/25/2021 1:59:03 PM By: Donnamarie Poag Entered By: Donnamarie Poag on 08/25/2021 13:33:45 [name] ([id number]) -------------------------------------------------------------------------------- Wound Assessment Details Patient Name: [name], [name]. Date of Service: 08/25/2021 1:00 PM Medical Record Number: [id number] Patient Account Number: [id number] Date of Birth/Sex: 11-30-1945 (75 y.o. M) Treating RN: Donnamarie Poag Primary Care : Fulton Reek Other Clinician: Referring : Fulton Reek Treating /Extender: Skipper Cliche in Treatment: 4 Wound Status Wound Number: 9 Primary Diabetic Wound/Ulcer of the Lower Extremity Etiology: Wound Location:  Left Calcaneus Wound Open Wounding Event: Gradually Appeared Status: Date Acquired: 07/07/2021 Comorbid Cataracts, Arrhythmia, Coronary Artery Disease, Weeks Of Treatment: 4 History: Hypertension, Type II Diabetes, Osteoarthritis, Clustered Wound: No Neuropathy Photos Wound Measurements Length: (cm) 1 Width: (cm) 0.6 Depth: (cm) 0.1 Area: (cm) 0.471 Volume: (cm) 0.047 % Reduction in Area: 78.6% % Reduction in Volume: 78.6% Tunneling: No Undermining: No Wound Description Classification: Grade 2 Wound Margin: Thickened Exudate Amount: Medium Exudate Type: Serosanguineous Exudate Color: red, brown Foul Odor After Cleansing: No Slough/Fibrino Yes Wound Bed Granulation Amount: Large (67-100%) Exposed Structure Granulation Quality: Red, Pink Fascia Exposed: No Necrotic Amount: Small (1-33%) Fat Layer (Subcutaneous Tissue) Exposed: Yes Necrotic Quality: Adherent Slough Tendon Exposed: No Muscle Exposed: No Joint Exposed: No Bone Exposed: No Treatment Notes Wound #9 (Calcaneus) Wound Laterality: Left Cleanser Normal Saline Discharge Instruction: Wash your hands with soap and water. Remove old dressing, discard into plastic bag and place into trash. Cleanse the wound with Normal Saline prior to applying a clean dressing using gauze sponges, not tissues or cotton balls. Do not [name], [name] ([id number]) scrub or use excessive force. Pat dry using gauze sponges, not tissue or cotton balls. Peri-Wound Care Topical Primary Dressing Silvercel Small 2x2 (in/in) Discharge Instruction: Apply Silvercel Small 2x2 (in/in) as instructed Secondary Dressing Gauze heel cup Discharge Instruction: apply over dressing to protect heel Secured With 56M Medipore H Soft Cloth Surgical Tape, 2x2 (in/yd) Coban Cohesive Bandage 4x5 (yds) Stretched  Discharge Instruction: Apply Coban as directed. Kerlix Roll Sterile or Non-Sterile 6-ply 4.5x4 (yd/yd) Discharge Instruction: Apply Kerlix  as directed Compression Wrap Compression Stockings Add-Ons Electronic Signature(s) Signed: 08/25/2021 1:59:03 PM By: Donnamarie Poag Entered By: Donnamarie Poag on 08/25/2021 13:15:35 [name] ([id number]) -------------------------------------------------------------------------------- [place] Details Patient Name: [name]. Date of Service: 08/25/2021 1:00 PM Medical Record Number: [id number] Patient Account Number: [id number] Date of Birth/Sex: Aug 01, 1946 (75 y.o. M) Treating RN: Donnamarie Poag Primary Care : Fulton Reek Other Clinician: Referring : Fulton Reek Treating /Extender: Skipper Cliche in Treatment: 4 Vital Signs Time Taken: 15:10 Temperature (F): 98.5 Height (in): 69 Pulse (bpm): 71 Weight (lbs): 267 Respiratory Rate (breaths/min): 16 Body Mass Index (BMI): 39.4 Blood Pressure (mmHg): 149/78 Reference Range: 80 - 120 mg / dl Electronic Signature(s) Signed: 08/25/2021 1:59:03 PM By: Donnamarie Poag Entered ByDonnamarie Poag on 08/25/2021 13:10:44

## 2021-09-01 ENCOUNTER — Other Ambulatory Visit: Payer: Self-pay

## 2021-09-01 ENCOUNTER — Encounter: Payer: Medicare HMO | Admitting: Physician Assistant

## 2021-09-01 DIAGNOSIS — E11621 Type 2 diabetes mellitus with foot ulcer: Secondary | ICD-10-CM | POA: Diagnosis not present

## 2021-09-01 NOTE — Progress Notes (Addendum)
TYDEN, KANN ([id number]) Visit Report for 09/01/2021 Chief Complaint Document Details Patient Name: [name], [name]. Date of Service: 09/01/2021 1:00 PM Medical Record Number: [id number] Patient Account Number: [id number] Date of Birth/Sex: 11-06-1945 (75 y.o. M) Treating RN: Donnamarie Poag Primary Care Provider: Fulton Reek Other Clinician: Referring Provider: Fulton Reek Treating Provider/Extender: Skipper Cliche in Treatment: 5 Information Obtained from: Patient Chief Complaint Right LE ulcers and cellulitis Electronic Signature(s) Signed: 09/01/2021 1:16:24 PM By: Worthy Keeler PA-C Entered By: Worthy Keeler on 09/01/2021 13:16:24 [name] ([id number]) -------------------------------------------------------------------------------- Debridement Details Patient Name: [name], [name]. Date of Service: 09/01/2021 1:00 PM Medical Record Number: [id number] Patient Account Number: [id number] Date of Birth/Sex: November 26, 1945 (75 y.o. M) Treating RN: Donnamarie Poag Primary Care Provider: Fulton Reek Other Clinician: Referring Provider: Fulton Reek Treating Provider/Extender: Skipper Cliche in Treatment: 5 Debridement Performed for Wound #9 Left Calcaneus Assessment: Performed By: Physician Tommie Sams., PA-C Debridement Type: Debridement Severity of Tissue Pre Debridement: Fat layer exposed Level of Consciousness (Pre- Awake and Alert procedure): Pre-procedure Verification/Time Out Yes - 13:18 Taken: Start Time: 13:18 Pain Control: Lidocaine Total Area Debrided (L x W): 1 (cm) x 0.7 (cm) = 0.7 (cm) Tissue and other material Viable, Non-Viable, Callus, Slough, Subcutaneous, Slough debrided: Level: Skin/Subcutaneous Tissue Debridement Description: Excisional Instrument: Curette Bleeding: Minimum Hemostasis Achieved: Pressure Response to Treatment: Procedure was tolerated well Level of Consciousness (Post- Awake and  Alert procedure): Post Debridement Measurements of Total Wound Length: (cm) 0.9 Width: (cm) 0.5 Depth: (cm) 0.1 Volume: (cm) 0.035 Character of Wound/Ulcer Post Debridement: Improved Severity of Tissue Post Debridement: Fat layer exposed Post Procedure Diagnosis Same as Pre-procedure Electronic Signature(s) Signed: 09/01/2021 4:13:54 PM By: Donnamarie Poag Signed: 09/01/2021 5:26:28 PM By: Worthy Keeler PA-C Entered By: Donnamarie Poag on 09/01/2021 13:21:50 [name] ([id number]) -------------------------------------------------------------------------------- HPI Details Patient Name: [name], [name]. Date of Service: 09/01/2021 1:00 PM Medical Record Number: [id number] Patient Account Number: [id number] Date of Birth/Sex: 1946/02/07 (75 y.o. M) Treating RN: Donnamarie Poag Primary Care Provider: Fulton Reek Other Clinician: Referring Provider: Fulton Reek Treating Provider/Extender: Skipper Cliche in Treatment: 5 History of Present Illness HPI Description: 09/24/2020 on evaluation today patient presents today for a heel ulcer that he tells me has been present for about 2 years. He has been seeing podiatry and they have been attempting to manage this including what sounds to be a total contact cast, Unna boot, and just standard dressings otherwise as well. Most recently has been using triple antibiotic ointment. With that being said unfortunately despite everything he really has not had any significant improvement. He tells me that he cannot even really remember exactly how this began but he presumed it may have rubbed on his shoes or something of that nature. 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 He also does have chronic pain in the way of neuropathy which he takes medications for including Cymbalta and  methadone. He tells me that this does seem to help. Fortunately there is no signs of active infection at this time. His most recent hemoglobin A1c was 8.1 though he knows this was this year he cannot tell me the exact time. His fluid pills currently to help with some of the lower extremity edema although he does obviously have signs of venous stasis/lymphedema. Currently there is  no evidence of active infection. No fevers, chills, nausea, vomiting, or diarrhea. Patient has had fairly recent ABIs which were performed on 07/19/2020 and revealed that he has normal findings in both the ankle and toe locations bilaterally. His ABI on the right was 1.09 on the left was 1.08 with a TBI on the right of 0.88 and on the left of 0.94. Triphasic flow was noted throughout. 10/08/2020 on evaluation today patient appears to be doing pretty well in regard to his left heel currently in fact this is doing a great job and seems to be healing quite nicely. Unfortunately on his right leg he had a pile of wood that actually fell on him injuring his right leg this is somewhat erythematous has me concerned little bit about cellulitis though there is not really a good area to culture at this point. 10/24/2020 upon evaluation today patient appears to be doing well with regard to his heel ulcer. He is showing signs of improvement which is great news. His right leg is completely healed. Overall I feel like he is doing excellent and there is no signs of infection. 11/07/2020 upon evaluation today patient appears to be doing well with regard to his heel ulcer. He tells me that last week when he was unable to come in his wife actually thought that the wound was very close to closing if not closed. Then it began to "reopen again". I really feel like what may have happened as the collagen may have dried over the wound bed and that because that misunderstanding with thinking that the wound was healing. With that being said I did not see it  last week I do not know that for certain. Either way I feel like he is doing great today I see no signs of infection at this point. 11/26/2020 upon inspection today patient appears to be doing decently well in regard to his heel ulcer. He has been tolerating the dressing changes without complication. Fortunately there is no sign of active infection at this time. No fevers, chills, nausea, vomiting, or diarrhea. 12/10/2020 upon evaluation today patient appears to be doing fairly well in regard to the wound on his heel as well as what appears to be a new wound of the left first metatarsal head plantar aspect. This seems to be an area that was callus that has split as the patient tells me has been trying to walk on his toes more has probably where this came from. With that being said there does not appear to be signs of active infection which is great news. 12/17/2020 upon evaluation today patient actually appears to be making good progress currently. Fortunately there is no evidence of active infection at this time. Overall I feel like he is very close to complete closure. 12/26/2020 upon evaluation today patient appears to be doing excellent in regard to his wounds. In fact I am not certain that these are not even completely healed on initial inspection. Overall I am very pleased with where things stand today. Good news is that they are healed he is actually get ready to go out of town and that will be helpful as well as he will be a full part of the time. Readmission: 02/04/2021 upon evaluation today patient appears to be doing well at this point in regard to his left heel that I previously saw him for. Unfortunately he is having issues with his right lower extremity. He has significant wounds at this point he also has erythema noted  there is definite signs of cellulitis which is unfortunate. With that being said I think we do need to address this sooner rather than later. The good news is he did have a  nice trip to the beach. He tells me that he had no issues during that time. 4/27; patient on Bactrim. Culture showed methicillin sensitive staph aureus therefore the Bactrim should be effective. 2 small areas on the right leg are healed the area on the mid aspect of the tibia almost 100% covered in a very adherent necrotic debris. We have been using silver alginate 02/20/2021 upon evaluation today patient appears to be doing well with regard to his wound. He is showing signs of improvement which is great news overall very pleased with where things stand today. No fevers, chills, nausea, vomiting, or diarrhea. 02/27/2021 upon evaluation today patient appears to be doing well with regard to his leg ulcer. He is tolerating the dressing changes and overall appears to be doing quite excellent. I am extremely pleased with where things stand and overall I think patient is making great progress. There is no sign of active infection at this time which is also great news. 03/06/2021 upon evaluation today patient appears to be doing excellent in regard to his wounds. In fact he appears to be completely healed today based on what I am seeing. This is excellent news and overall I am extremely pleased with where he stands. Overall the patient is happy to hear this as well this has been quite sometime coming. Readmission: 04/01/2021 patient unfortunately returns for readmission today. He tells me that he has been wearing his compression socks on the right leg daily YADRIEL, KERRIGAN ([id number]) and he does not really know what is going on and why his legs are doing what they are doing. We did therefore go ahead and probe deeper into exactly what has been going on with him today. Subsequently the patient tells me that when he gets up and what we would call "first thing in the morning" are really 2 different things". He does not tend to sleep well so he tells me that he will often wake up at 3:00 in the morning. He  will then potentially going into the living room to get in his chair where he may read a book for a little while and then potentially fall asleep back in his chair. He then subsequently wake up around 5:00 or so and then get up and in his words "putter around". This often will end with him proceeding at some point around 8 AM or 9 AM to putting on his compression socks. With that being said this means that anywhere from the 3:00 in the morning till roughly around 9:00 in the morning he has no compression on yet he is sitting in his recliner, walking around and up and about, and this is at least 5 to 6 hours of noncompressed time. That may be our issue here but I did not realize until we discussed this further today. Fortunately there does not appear to be any signs of active infection at this time which is great news. With that being said he has multiple wounds of the bilateral lower extremities. 04/10/2021 upon evaluation today patient appears to be doing well with regard to his legs for the most part. He does look like he had some injury where his wrap slid down nonetheless he did some "doctoring on them". I do feel like most of the areas honestly have cleared back  up which is great news. There does not appear to be any signs of active infection at this time which is also great news. No fevers, chills, nausea, vomiting, or diarrhea. 04/18/2021 upon evaluation today patient appears to be doing well with regard to his wounds in fact he appears to be completely healed which is great news. Fortunately there does not appear to be any signs of active infection which is great news. No fevers, chills, nausea, vomiting, or diarrhea. READMISSION 07/28/2021 This is a now 75 year old man we have had in this clinic for quite a bit of this year. 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 He wears compression stockings. 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 He comes in an old running shoes although he says he wears better shoes at home. His history is that he noticed a blister in the right posterior heel. This open. He has been applying Neosporin to it currently the wound measures 2 x 1.4 cm. 100% slough covered. Not really offloading this in any rigorous way. Past medical history is essentially unchanged he has paroxysmal atrial fibrillation on Coumadin type 2 diabetes with a recent hemoglobin A1c of 7 on metformin. ABI in our clinic was 0.98 on the right 08/05/2021 upon evaluation today patient appears to be doing well with regard to his heel ulcer. Fortunately there does not appear to be any signs of active infection at this time. Overall I been very pleased with where things seem to be currently as far as the wound healing is concerned. Again this is first a lot of seeing him Dr. Dellia Nims readmitted him last week. Nonetheless I think we are definitely making some progress here. 08/11/2021 upon evaluation today patient's wound on the heel actually showing signs of excellent improvement. I am actually very pleased with where things stand currently. No fevers, chills, nausea, vomiting, or diarrhea. There does not appear to be any need for sharp debridement today either which is also great news. 08/25/2021 upon evaluation today patient appears to be doing well with regard to his heel ulcer. This is actually looking significantly improved compared to last time I saw him. Fortunately there does not appear to be any evidence of active infection at this time. No fevers, chills, nausea, vomiting, or diarrhea. 09/01/2021 upon evaluation today patient appears to be doing decently well in regard to his wounds. Fortunately there does not appear to be any signs of active infection at this time which is great news and overall very pleased in that regard. I do not see any evidence of active  infection systemically which is great news as well. No fevers, chills, nausea, vomiting, or diarrhea. I think that the heel is making excellent progress. Electronic Signature(s) Signed: 09/01/2021 1:38:27 PM By: Worthy Keeler PA-C Entered By: Worthy Keeler on 09/01/2021 13:38:27 [name], [name] ([id number]) -------------------------------------------------------------------------------- Physical Exam Details Patient Name: [name], [name] Date of Service: 09/01/2021 1:00 PM Medical Record Number: [id number] Patient Account Number: [id number] Date of Birth/Sex: Sep 05, 1946 (75 y.o. M) Treating RN: Donnamarie Poag Primary Care Provider: Fulton Reek Other Clinician: Referring Provider: Fulton Reek Treating Provider/Extender: Skipper Cliche in Treatment: 5 Constitutional Well-nourished and well-hydrated in no acute distress. Respiratory normal breathing without difficulty. Psychiatric this patient is able to make decisions and demonstrates good insight into disease process. Alert and Oriented x 3. pleasant and cooperative. Notes Upon inspection patient's wound bed showed signs  of good granulation epithelization at this point. Fortunately there does not appear to be any evidence of infection systemically which is great news and overall I am extremely happy in that regard. Overall while the heel is doing well he still having issues with atrial fibrillation he does have a cardioversion on Thursday and then on Monday he is going to be having a bone marrow biopsy as well. Electronic Signature(s) Signed: 09/01/2021 1:39:08 PM By: Worthy Keeler PA-C Entered By: Worthy Keeler on 09/01/2021 13:39:08 [name] ([id number]) -------------------------------------------------------------------------------- Physician Orders Details Patient Name: [name], [name]. Date of Service: 09/01/2021 1:00 PM Medical Record Number: [id number] Patient Account Number: [id number] Date of  Birth/Sex: 1946/05/19 (75 y.o. 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 - due to other appts o Nurse Visit as needed - call to schedule Bathing/ Shower/ Hygiene o May shower with wound dressing protected with water repellent cover or cast protector. o No tub bath. Edema Control - Lymphedema / Segmental Compressive Device / Other o Patient to wear own compression stockings. Remove compression stockings every night before going to bed and put on every morning when getting up. - right leg o Elevate leg(s) parallel to the floor when sitting. o DO YOUR BEST to sleep in the bed at night. DO NOT sleep in your recliner. Long hours of sitting in a recliner leads to swelling of the legs and/or potential wounds on your backside. Off-Loading o Other: - L heel offloader shoe Keep pressure off of heel Additional Orders / Instructions o Follow Nutritious Diet and Increase Protein Intake - check blood sugar and eat protein for wound healing Wound Treatment Wound #9 - Calcaneus Wound Laterality: Left Cleanser: Normal Saline Every Other Day/15 Days Discharge Instructions: Wash your hands with soap and water. Remove old dressing, discard into plastic bag and place into trash. Cleanse the wound with Normal Saline prior to applying a clean dressing using gauze sponges, not tissues or cotton balls. Do not scrub or use excessive force. Pat dry using gauze sponges, not tissue or cotton balls. Primary Dressing: Silvercel Small  2x2 (in/in) (Generic) Every Other Day/15 Days Discharge Instructions: Apply Silvercel Small 2x2 (in/in) as instructed Secondary Dressing: Gauze (Generic) Every Other Day/15 Days Secondary Dressing: heel cup Every Other Day/15 Days Discharge Instructions: apply over dressing to protect heel Secured With: 25M Medipore H Soft Cloth Surgical Tape, 2x2 (in/yd) (Generic) Every Other Day/15 Days Secured With: Coban Cohesive Bandage 4x5 (yds) Stretched Every Other Day/15 Days Discharge Instructions: Apply Coban as directed. Secured With: [employer] or Non-Sterile 6-ply 4.5x4 (yd/yd) (Generic) Every Other Day/15 Days Discharge Instructions: Apply Kerlix as directed Electronic Signature(s) [name], [name] ([id number]) Signed: 09/01/2021 4:13:54 PM By: Donnamarie Poag Signed: 09/01/2021 5:26:28 PM By: Worthy Keeler PA-C Entered By: Donnamarie Poag on 09/01/2021 13:23:04 [name] ([id number]) -------------------------------------------------------------------------------- Problem List Details Patient Name: [name], [name]. Date of Service: 09/01/2021 1:00 PM Medical Record Number: [id number] Patient Account Number: [id number] Date of Birth/Sex: July 12, 1946 (75 y.o. 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 Date of Service: 09/01/2021 1:00 PM Medical Record Number: [id number] Patient Account Number: [id number] Date of Birth/Sex: [birth year] (75 y.o. M) Treating RN: Donnamarie Poag Primary Care Provider: Fulton Reek Other Clinician: Referring Provider: Fulton Reek Treating Provider/Extender: Skipper Cliche in Treatment: 5 Subjective Chief Complaint Information obtained from Patient Right LE ulcers and cellulitis History of Present Illness (HPI) 09/24/2020 on evaluation today patient presents today for a heel ulcer that he tells me has been present for about 2 years. He has been seeing podiatry and they have been attempting to manage this including what sounds to be a total contact cast, Unna boot, and just standard dressings otherwise as well. Most recently has been using triple antibiotic ointment. With that being said unfortunately despite everything he really has not had any significant improvement. He tells me that he cannot even really remember exactly how this began but he presumed it may have rubbed on his shoes or something of that nature. 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 He tells me that this does seem to help. Fortunately there is no signs of active infection at this time. His most recent hemoglobin A1c was 8.1 though he knows this was this year he cannot tell me the exact time. His fluid pills currently to help with some of the lower extremity edema although he does obviously have signs of venous stasis/lymphedema. Currently there is no evidence of  active infection. No fevers, chills, nausea, vomiting, or diarrhea. Patient has had fairly recent ABIs which were performed on 07/19/2020 and revealed that he has normal findings in both the ankle and toe locations bilaterally. His ABI on the right was 1.09 on the left was 1.08 with a TBI on the right of 0.88 and on the left of 0.94. Triphasic flow was noted throughout. 10/08/2020 on evaluation today patient appears to be doing pretty well in regard to his left heel currently in fact this is doing a great job and seems to be healing quite nicely. Unfortunately on his right leg he had a pile of wood that actually fell on him injuring his right leg this is somewhat erythematous has me concerned little bit about cellulitis though there is not really a good area to culture at this point. 10/24/2020 upon evaluation today patient appears to be doing well with regard to his heel ulcer. He is showing signs of improvement which is great news. His right leg is completely healed. Overall I feel like he is doing excellent and there is no signs of infection. 11/07/2020 upon evaluation today patient appears to be doing well with regard to his heel ulcer. He tells me that last week when he was unable to  come in his wife actually thought that the wound was very close to closing if not closed. Then it began to "reopen again". I really feel like what may have happened as the collagen may have dried over the wound bed and that because that misunderstanding with thinking that the wound was healing. With that being said I did not see it last week I do not know that for certain. Either way I feel like he is doing great today I see no signs of infection at this point. 11/26/2020 upon inspection today patient appears to be doing decently well in regard to his heel ulcer. He has been tolerating the dressing changes without complication. Fortunately there is no sign of active infection at this time. No fevers, chills, nausea,  vomiting, or diarrhea. 12/10/2020 upon evaluation today patient appears to be doing fairly well in regard to the wound on his heel as well as what appears to be a new wound of the left first metatarsal head plantar aspect. This seems to be an area that was callus that has split as the patient tells me has been trying to walk on his toes more has probably where this came from. With that being said there does not appear to be signs of active infection which is great news. 12/17/2020 upon evaluation today patient actually appears to be making good progress currently. Fortunately there is no evidence of active infection at this time. Overall I feel like he is very close to complete closure. 12/26/2020 upon evaluation today patient appears to be doing excellent in regard to his wounds. In fact I am not certain that these are not even completely healed on initial inspection. Overall I am very pleased with where things stand today. Good news is that they are healed he is actually get ready to go out of town and that will be helpful as well as he will be a full part of the time. Readmission: 02/04/2021 upon evaluation today patient appears to be doing well at this point in regard to his left heel that I previously saw him for. Unfortunately he is having issues with his right lower extremity. He has significant wounds at this point he also has erythema noted there is definite signs of cellulitis which is unfortunate. With that being said I think we do need to address this sooner rather than later. The good news is he did have a nice trip to the beach. He tells me that he had no issues during that time. 4/27; patient on Bactrim. Culture showed methicillin sensitive staph aureus therefore the Bactrim should be effective. 2 small areas on the right leg are healed the area on the mid aspect of the tibia almost 100% covered in a very adherent necrotic debris. We have been using silver alginate 02/20/2021 upon evaluation  today patient appears to be doing well with regard to his wound. He is showing signs of improvement which is great news overall very pleased with where things stand today. No fevers, chills, nausea, vomiting, or diarrhea. 02/27/2021 upon evaluation today patient appears to be doing well with regard to his leg ulcer. He is tolerating the dressing changes and overall appears to be doing quite excellent. I am extremely pleased with where things stand and overall I think patient is making great progress. There is no sign of active infection at this time which is also great news. 03/06/2021 upon evaluation today patient appears to be doing excellent in regard to his wounds. In fact he appears  to be completely healed today based on what I am seeing. This is excellent news and overall I am extremely pleased with where he stands. Overall the patient is happy to hear [name], [name] ([id number]) this as well this has been quite sometime coming. Readmission: 04/01/2021 patient unfortunately returns for readmission today. He tells me that he has been wearing his compression socks on the right leg daily and he does not really know what is going on and why his legs are doing what they are doing. We did therefore go ahead and probe deeper into exactly what has been going on with him today. Subsequently the patient tells me that when he gets up and what we would call "first thing in the morning" are really 2 different things". He does not tend to sleep well so he tells me that he will often wake up at 3:00 in the morning. He will then potentially going into the living room to get in his chair where he may read a book for a little while and then potentially fall asleep back in his chair. He then subsequently wake up around 5:00 or so and then get up and in his words "putter around". This often will end with him proceeding at some point around 8 AM or 9 AM to putting on his compression socks. With that being said this  means that anywhere from the 3:00 in the morning till roughly around 9:00 in the morning he has no compression on yet he is sitting in his recliner, walking around and up and about, and this is at least 5 to 6 hours of noncompressed time. That may be our issue here but I did not realize until we discussed this further today. Fortunately there does not appear to be any signs of active infection at this time which is great news. With that being said he has multiple wounds of the bilateral lower extremities. 04/10/2021 upon evaluation today patient appears to be doing well with regard to his legs for the most part. He does look like he had some injury where his wrap slid down nonetheless he did some "doctoring on them". I do feel like most of the areas honestly have cleared back up which is great news. There does not appear to be any signs of active infection at this time which is also great news. No fevers, chills, nausea, vomiting, or diarrhea. 04/18/2021 upon evaluation today patient appears to be doing well with regard to his wounds in fact he appears to be completely healed which is great news. Fortunately there does not appear to be any signs of active infection which is great news. No fevers, chills, nausea, vomiting, or diarrhea. READMISSION 07/28/2021 This is a now 75 year old man we have had in this clinic for quite a bit of this year. 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 He wears compression stockings. 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 He comes in an old running shoes although he says he wears better shoes at home. His history is that he noticed a blister in the right posterior heel. This open. He has been applying Neosporin to it currently the wound measures 2 x 1.4 cm. 100% slough covered. Not really offloading this in any rigorous way. Past medical history is  essentially unchanged he has paroxysmal atrial fibrillation on Coumadin type 2 diabetes with a recent hemoglobin A1c of 7 on metformin. ABI  in our clinic was 0.98 on the right 08/05/2021 upon evaluation today patient appears to be doing well with regard to his heel ulcer. Fortunately there does not appear to be any signs of active infection at this time. Overall I been very pleased with where things seem to be currently as far as the wound healing is concerned. Again this is first a lot of seeing him Dr. Dellia Nims readmitted him last week. Nonetheless I think we are definitely making some progress here. 08/11/2021 upon evaluation today patient's wound on the heel actually showing signs of excellent improvement. I am actually very pleased with where things stand currently. No fevers, chills, nausea, vomiting, or diarrhea. There does not appear to be any need for sharp debridement today either which is also great news. 08/25/2021 upon evaluation today patient appears to be doing well with regard to his heel ulcer. This is actually looking significantly improved compared to last time I saw him. Fortunately there does not appear to be any evidence of active infection at this time. No fevers, chills, nausea, vomiting, or diarrhea. 09/01/2021 upon evaluation today patient appears to be doing decently well in regard to his wounds. Fortunately there does not appear to be any signs of active infection at this time which is great news and overall very pleased in that regard. I do not see any evidence of active infection systemically which is great news as well. No fevers, chills, nausea, vomiting, or diarrhea. I think that the heel is making excellent progress. Objective Constitutional Well-nourished and well-hydrated in no acute distress. Vitals Time Taken: 1:15 PM, Height: 69 in, Weight: 267 lbs, BMI: 39.4, Temperature: 98.3 F, Pulse: 77 bpm, Respiratory Rate: 18 breaths/min, Blood Pressure: 133/78  mmHg. Respiratory normal breathing without difficulty. Psychiatric this patient is able to make decisions and demonstrates good insight into disease process. Alert and Oriented x 3. pleasant and cooperative. LENARDO, WESTWOOD ([id number]) General Notes: Upon inspection patient's wound bed showed signs of good granulation epithelization at this point. Fortunately there does not appear to be any evidence of infection systemically which is great news and overall I am extremely happy in that regard. Overall while the heel is doing well he still having issues with atrial fibrillation he does have a cardioversion on Thursday and then on Monday he is going to be having a bone marrow biopsy as well. Integumentary (Hair, Skin) Wound #9 status is Open. Original cause of wound was Gradually Appeared. The date acquired was: 07/07/2021. The wound has been in treatment 5 weeks. The wound is located on the Left Calcaneus. The wound measures 0.9cm length x 0.5cm width x 0.1cm depth; 0.353cm^2 area and 0.035cm^3 volume. There is Fat Layer (Subcutaneous Tissue) exposed. There is no tunneling or undermining noted. There is a medium amount of serosanguineous drainage noted. The wound margin is thickened. There is large (67-100%) red, pink granulation within the wound bed. There is a small (1-33%) amount of necrotic tissue within the wound bed including Adherent Slough. 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 There was a Excisional Skin/Subcutaneous Tissue Debridement with a total area of 0.7 sq cm performed by Tommie Sams., PA-C. With the following  instrument(s):  Curette to remove Viable and Non-Viable tissue/material. Material removed includes Callus, Subcutaneous Tissue, and Slough after achieving pain control using Lidocaine. A time out was conducted at 13:18, prior to the start of the procedure. A Minimum amount of bleeding was controlled with Pressure. The procedure was tolerated well. Post Debridement Measurements: 0.9cm length x 0.5cm width x 0.1cm depth; 0.035cm^3 volume. Character of Wound/Ulcer Post Debridement is improved. Severity of Tissue Post Debridement is: Fat layer exposed. Post procedure Diagnosis Wound #9: Same as Pre-Procedure Plan Follow-up Appointments: Return Appointment in 2 weeks. - due to other appts Nurse Visit as needed - call to schedule Bathing/ Shower/ Hygiene: May shower with wound dressing protected with water repellent cover or cast protector. No tub bath. Edema Control - Lymphedema / Segmental Compressive Device / Other: Patient to wear own compression stockings. Remove compression stockings every night before going to bed and put on every morning when getting up. - right leg Elevate leg(s) parallel to the floor when sitting. DO YOUR BEST to sleep in the bed at night. DO NOT sleep in your recliner. Long hours of sitting in a recliner leads to swelling of the legs and/or potential wounds on your backside. Off-Loading: Other: - L heel offloader shoe Keep pressure off of heel Additional Orders / Instructions: Follow Nutritious Diet and Increase Protein Intake - check blood sugar and eat protein for wound healing WOUND #9: - Calcaneus Wound Laterality: Left Cleanser: Normal Saline Every Other Day/15 Days Discharge Instructions: Wash your hands with soap and water. Remove old dressing, discard into plastic bag and place into trash. Cleanse the wound with Normal Saline prior to applying a clean dressing using gauze sponges, not tissues or cotton balls. Do not scrub or use excessive force. Pat dry using  gauze sponges, not tissue or cotton balls. Primary Dressing: Silvercel Small 2x2 (in/in) (Generic) Every Other Day/15 Days Discharge Instructions: Apply Silvercel Small 2x2 (in/in) as instructed Secondary Dressing: Gauze (Generic) Every Other Day/15 Days Secondary Dressing: heel cup Every Other Day/15 Days [name], [name] ([id number]) Discharge Instructions: apply over dressing to protect heel Secured With: 41M Medipore H Soft Cloth Surgical Tape, 2x2 (in/yd) (Generic) Every Other Day/15 Days Secured With: Coban Cohesive Bandage 4x5 (yds) Stretched Every Other Day/15 Days Discharge Instructions: Apply Coban as directed. Secured With: [employer] or Non-Sterile 6-ply 4.5x4 (yd/yd) (Generic) Every Other Day/15 Days Discharge Instructions: Apply Kerlix as directed 1. Would recommend currently that we going continue with the wound care measures as before. I do believe the patient is making good progress and this is great news we have been utilizing the silver alginate dressing followed by gauze. 2. Also can recommend a heel cup be continued which I think is doing a good job as well. 3. I would also suggest that the patient continue to offload using the offloading shoe I think the more of this he does the better things will be. We will see patient back for reevaluation in 1 week here in the clinic. If anything worsens or changes patient will contact our office for additional recommendations. Electronic Signature(s) Signed: 09/01/2021 1:39:50 PM By: Worthy Keeler PA-C Entered By: Worthy Keeler on 09/01/2021 13:39:49 NADIR, VASQUES ([id number]) -------------------------------------------------------------------------------- SuperBill Details Patient Name: KARO, ROG. Date of Service: 09/01/2021 Medical Record Number: [id number] Patient Account Number: [id number] Date of Birth/Sex: May 30, 1946 (75 y.o. M) Treating RN: Donnamarie Poag Primary Care Provider: Fulton Reek Other  Clinician: Referring Provider: Fulton Reek Treating Provider/Extender: Skipper Cliche in Treatment:  5 Diagnosis Coding ICD-10 Codes Code Description E11.621 Type 2 diabetes mellitus with foot ulcer L97.421 Non-pressure chronic ulcer of left heel and midfoot limited to breakdown of skin E11.42 Type 2 diabetes mellitus with diabetic polyneuropathy Facility Procedures CPT4 Code: [id number] Description: 50932 - DEB SUBQ TISSUE 20 SQ CM/< Modifier: Quantity: 1 CPT4 Code: Description: ICD-10 Diagnosis Description L97.421 Non-pressure chronic ulcer of left heel and midfoot limited to breakdow Modifier: n of skin Quantity: Physician Procedures CPT4 Code: [id number] Description: 99214 - WC PHYS LEVEL 4 - EST PT Modifier: 25 Quantity: 1 CPT4 Code: Description: ICD-10 Diagnosis Description E11.621 Type 2 diabetes mellitus with foot ulcer L97.421 Non-pressure chronic ulcer of left heel and midfoot limited to breakdow E11.42 Type 2 diabetes mellitus with diabetic polyneuropathy Modifier: n of skin Quantity: CPT4 Code: [id number] Description: 25053 - WC PHYS SUBQ TISS 20 SQ CM Modifier: Quantity: 1 CPT4 Code: Description: ICD-10 Diagnosis Description L97.421 Non-pressure chronic ulcer of left heel and midfoot limited to breakdow Modifier: n of skin Quantity: Electronic Signature(s) Signed: 09/01/2021 1:40:27 PM By: Worthy Keeler PA-C Entered By: Worthy Keeler on 09/01/2021 13:40:27

## 2021-09-01 NOTE — Progress Notes (Signed)
[name], [name] ([id number]) Visit Report for 09/01/2021 Arrival Information Details Patient Name: [name], [name]. Date of Service: 09/01/2021 1:00 PM Medical Record Number: [id number] Patient Account Number: [id number] Date of Birth/Sex: 12-May-1946 (75 y.o. M) Treating RN: [name] Primary Care [name]: [name] Other Clinician: Referring [name]: [name] Treating [name]/Extender: [name] in Treatment: 5 Visit Information History Since Last Visit Added or deleted any medications: No Patient Arrived: [name] Had a fall or experienced change in No Arrival Time: 13:00 activities of daily living that may affect Accompanied By: self risk of falls: Transfer Assistance: None Hospitalized since last visit: No Patient Identification Verified: Yes Has Dressing in Place as Prescribed: Yes Secondary Verification Process Completed: Yes Pain Present Now: No Patient Has Alerts: Yes Patient Alerts: Patient on Blood Thinner Coumadin Diabetic aspirin 64m Electronic Signature(s) Signed: 09/01/2021 4:13:54 PM By: [name] PoagEntered By: [name] 09/01/2021 13:03:24 [name]([id number] -------------------------------------------------------------------------------- Clinic Level of Care Assessment Details Patient Name: [name] Date of Service: 09/01/2021 1:00 PM Medical Record Number: [id number] Account Number: [id number] of Birth/Sex: 10/13/1946/05/23(75y.o. M) Treating RN: [name] PoagPrimary Care Stepheni [name]: SFulton ReekOther Clinician: Referring [name]: SFulton ReekTreating Kade Demicco/Extender: SSkipper Clichein Treatment: 5 Clinic Level of Care Assessment Items TOOL 1 Quantity Score '[]'  - Use when EandM and Procedure is performed on INITIAL visit 0 ASSESSMENTS - Nursing Assessment / Reassessment '[]'  - General Physical Exam (combine w/ comprehensive assessment (listed just below) when performed on new 0 pt.  evals) '[]'  - 0 Comprehensive Assessment (HX, ROS, Risk Assessments, Wounds Hx, etc.) ASSESSMENTS - Wound and Skin Assessment / Reassessment '[]'  - Dermatologic / Skin Assessment (not related to wound area) 0 ASSESSMENTS - Ostomy and/or Continence Assessment and Care '[]'  - Incontinence Assessment and Management 0 '[]'  - 0 Ostomy Care Assessment and Management (repouching, etc.) PROCESS - Coordination of Care '[]'  - Simple Patient / Family Education for ongoing care 0 '[]'  - 0 Complex (extensive) Patient / Family Education for ongoing care '[]'  - 0 Staff obtains CProgrammer, systems Records, Test Results / Process Orders '[]'  - 0 Staff telephones HHA, Nursing Homes / Clarify orders / etc '[]'  - 0 Routine Transfer to another Facility (non-emergent condition) '[]'  - 0 Routine Hospital Admission (non-emergent condition) '[]'  - 0 New Admissions / IBiomedical engineer/ Ordering NPWT, Apligraf, etc. '[]'  - 0 Emergency Hospital Admission (emergent condition) PROCESS - Special Needs '[]'  - Pediatric / Minor Patient Management 0 '[]'  - 0 Isolation Patient Management '[]'  - 0 Hearing / Language / Visual special needs '[]'  - 0 Assessment of Community assistance (transportation, D/C planning, etc.) '[]'  - 0 Additional assistance / Altered mentation '[]'  - 0 Support Surface(s) Assessment (bed, cushion, seat, etc.) INTERVENTIONS - Miscellaneous '[]'  - External ear exam 0 '[]'  - 0 Patient Transfer (multiple staff / HCivil Service fast streamer/ Similar devices) '[]'  - 0 Simple Staple / Suture removal (25 or less) '[]'  - 0 Complex Staple / Suture removal (26 or more) '[]'  - 0 Hypo/Hyperglycemic Management (do not check if billed separately) '[]'  - 0 Ankle / Brachial Index (ABI) - do not check if billed separately Has the patient been seen at the hospital within the last three years: Yes Total Score: 0 Level Of Care: ____ [name]([id number] Electronic Signature(s) Signed: 09/01/2021 4:13:54 PM By: [name] PoagEntered By: [name]  09/01/2021 13:22:04 [name]([id number] -------------------------------------------------------------------------------- Encounter Discharge Information Details Patient Name: [name], [name] Date of  Service: 09/01/2021 1:00 PM Medical Record Number: [id number] Patient Account Number: [id number] Date of Birth/Sex: 18-Sep-1946 (75 y.o. M) Treating RN: [name] Primary Care [name]: [name] Other Clinician: Referring [name]: [name] Treating [name]/Extender: [name] in Treatment: 5 Encounter Discharge Information Items Post Procedure Vitals Discharge Condition: Stable Temperature (F): 98.3 Ambulatory Status: Cane Pulse (bpm): 77 Discharge Destination: Home Respiratory Rate (breaths/min): 18 Transportation: Private Auto Blood Pressure (mmHg): 133/78 Accompanied By: self Schedule Follow-up Appointment: Yes Clinical Summary of Care: Electronic Signature(s) Signed: 09/01/2021 4:13:54 PM By: [name] Entered By: [name] on 09/01/2021 13:30:39 [name] ([id number]) -------------------------------------------------------------------------------- Lower Extremity Assessment Details Patient Name: [name], [name]. Date of Service: 09/01/2021 1:00 PM Medical Record Number: [id number] Patient Account Number: [id number] Date of Birth/Sex: [birth year] (75 y.o. M) Treating RN: [name] Primary Care [name]: [name] Other Clinician: Referring [name]: [name] Treating [name]/Extender: [name] in Treatment: 5 Edema Assessment Assessed: [Left: Yes] [Right: No] Edema: [Left: N] [Right: o] Vascular Assessment Pulses: Dorsalis Pedis Palpable: [Left:Yes] Electronic Signature(s) Signed: 09/01/2021 4:13:54 PM By: [name] Entered By: [name] on 09/01/2021 13:11:38 [name] ([id number]) -------------------------------------------------------------------------------- Multi Wound Chart  Details Patient Name: [name], [name]. Date of Service: 09/01/2021 1:00 PM Medical Record Number: [id number] Patient Account Number: [id number] Date of Birth/Sex: [birth year] (75 y.o. M) Treating RN: [name] Primary Care Derril Franek: [name] Other Clinician: Referring Kalob Bergen: [name] Treating Kadeshia Kasparian/Extender: [name] in Treatment: 5 Vital Signs Height(in): 42 Pulse(bpm): 7 Weight(lbs): 40 Blood Pressure(mmHg): 133/78 Body Mass Index(BMI): 39 Temperature(F): 98.3 Respiratory Rate(breaths/min): 18 Photos: [N/A:N/A] Wound Location: Left Calcaneus N/A N/A Wounding Event: Gradually Appeared N/A N/A Primary Etiology: Diabetic Wound/Ulcer of the Lower N/A N/A Extremity Comorbid History: Cataracts, Arrhythmia, Coronary N/A N/A Artery Disease, Hypertension, Type II Diabetes, Osteoarthritis, Neuropathy Date Acquired: 07/07/2021 N/A N/A [name] of Treatment: 5 N/A N/A Wound Status: Open N/A N/A Measurements L x W x D (cm) 0.9x0.5x0.1 N/A N/A Area (cm) : 0.353 N/A N/A Volume (cm) : 0.035 N/A N/A % Reduction in Area: 83.90% N/A N/A % Reduction in Volume: 84.10% N/A N/A Classification: Grade 2 N/A N/A Exudate Amount: Medium N/A N/A Exudate Type: Serosanguineous N/A N/A Exudate Color: red, brown N/A N/A Wound Margin: Thickened N/A N/A Granulation Amount: Large (67-100%) N/A N/A Granulation Quality: Red, Pink N/A N/A Necrotic Amount: Small (1-33%) N/A N/A Exposed Structures: Fat Layer (Subcutaneous Tissue): N/A N/A Yes Fascia: No Tendon: No Muscle: No Joint: No Bone: No Treatment Notes Electronic Signature(s) Signed: 09/01/2021 4:13:54 PM By: [name] Entered By: [name] on 09/01/2021 13:17:19 [name] ([id number]) -------------------------------------------------------------------------------- [place] Details Patient Name: [name], DIVIS. Date of Service: 09/01/2021 1:00 PM Medical Record Number:  [id number] Patient Account Number: [id number] Date of Birth/Sex: [birth year] (75 y.o. M) Treating RN: [name] Primary Care Jamicia Haaland: [name] Other Clinician: Referring Doneta Bayman: [name] Treating Tangy Drozdowski/Extender: [name] in Treatment: 5 Active Inactive Wound/Skin Impairment Nursing Diagnoses: Impaired tissue integrity Knowledge deficit related to smoking impact on wound healing Knowledge deficit related to ulceration/compromised skin integrity Goals: Patient/caregiver will verbalize understanding of skin care regimen Date Initiated: 07/28/2021 Date Inactivated: 08/11/2021 Target Resolution Date: 08/08/2021 Goal Status: Met Ulcer/skin breakdown will have a volume reduction of 30% by week 4 Date Initiated: 07/28/2021 Target Resolution Date: 08/28/2021 Goal Status: Active Ulcer/skin breakdown will have a volume reduction of 50% by week 8 Date Initiated: 07/28/2021 Target Resolution Date: 09/27/2021 Goal Status: Active Ulcer/skin breakdown will have  a volume reduction of 80% by week 12 Date Initiated: 07/28/2021 Target Resolution Date: 10/28/2020 Goal Status: Active Ulcer/skin breakdown will heal within 14 [name] Date Initiated: 07/28/2021 Target Resolution Date: 11/12/2020 Goal Status: Active Interventions: Assess patient/caregiver ability to obtain necessary supplies Assess patient/caregiver ability to perform ulcer/skin care regimen upon admission and as needed Assess ulceration(s) every visit Notes: Electronic Signature(s) Signed: 09/01/2021 4:13:54 PM By: [name] Entered By: [name] on 09/01/2021 13:11:49 [name] ([id number]) -------------------------------------------------------------------------------- Pain Assessment Details Patient Name: [name], [name]. Date of Service: 09/01/2021 1:00 PM Medical Record Number: [id number] Patient Account Number: [id number] Date of Birth/Sex: 1946/08/07 (75 y.o. M) Treating RN: [name] Primary Care Jvion Turgeon: [name] Other Clinician: Referring Leilah Polimeni: [name] Treating Jelani Vreeland/Extender: [name] in Treatment: 5 Active Problems Location of Pain Severity and Description of Pain Patient Has Paino No Site Locations Rate the pain. Current Pain Level: 0 Pain Management and Medication Current Pain Management: Electronic Signature(s) Signed: 09/01/2021 4:13:54 PM By: [name] Entered By: [name] on 09/01/2021 13:09:18 [name] ([id number]) -------------------------------------------------------------------------------- Patient/Caregiver Education Details Patient Name: [name], [name]. Date of Service: 09/01/2021 1:00 PM Medical Record Number: [id number] Patient Account Number: [id number] Date of Birth/Gender: Jul 05, 1946 (75 y.o. M) Treating RN: [name] Primary Care Physician: [name] Other Clinician: Referring Physician: Fulton [name] Treating Physician/Extender: [name] in Treatment: 5 Education Assessment Education Provided To: Patient Education Topics Provided Basic Hygiene: Wound/Skin Impairment: Electronic Signature(s) Signed: 09/01/2021 4:13:54 PM By: [name] Entered By: [name] on 09/01/2021 13:22:29 [name] ([id number]) -------------------------------------------------------------------------------- Wound Assessment Details Patient Name: [name], [name]. Date of Service: 09/01/2021 1:00 PM Medical Record Number: [id number] Patient Account Number: [id number] Date of Birth/Sex: 1946/05/04 (75 y.o. M) Treating RN: [name] Primary Care Paislee Szatkowski: [name] Other Clinician: Referring Tykwon Fera: [name] Treating Deagan Sevin/Extender: [name] in Treatment: 5 Wound Status Wound Number: 9 Primary Diabetic Wound/Ulcer of the Lower Extremity Etiology: Wound Location: Left Calcaneus Wound Open Wounding Event: Gradually Appeared Status: Date  Acquired: 07/07/2021 Comorbid Cataracts, Arrhythmia, Coronary Artery Disease, [name] Of Treatment: 5 History: Hypertension, Type II Diabetes, Osteoarthritis, Clustered Wound: No Neuropathy Photos Wound Measurements Length: (cm) 0.9 Width: (cm) 0.5 Depth: (cm) 0.1 Area: (cm) 0.353 Volume: (cm) 0.035 % Reduction in Area: 83.9% % Reduction in Volume: 84.1% Tunneling: No Undermining: No Wound Description Classification: Grade 2 Wound Margin: Thickened Exudate Amount: Medium Exudate Type: Serosanguineous Exudate Color: red, brown Foul Odor After Cleansing: No Slough/Fibrino Yes Wound Bed Granulation Amount: Large (67-100%) Exposed Structure Granulation Quality: Red, Pink Fascia Exposed: No Necrotic Amount: Small (1-33%) Fat Layer (Subcutaneous Tissue) Exposed: Yes Necrotic Quality: Adherent Slough Tendon Exposed: No Muscle Exposed: No Joint Exposed: No Bone Exposed: No Treatment Notes Wound #9 (Calcaneus) Wound Laterality: Left Cleanser Normal Saline Discharge Instruction: Wash your hands with soap and water. Remove old dressing, discard into plastic bag and place into trash. Cleanse the wound with Normal Saline prior to applying a clean dressing using gauze sponges, not tissues or cotton balls. Do not [name], [name] ([id number]) scrub or use excessive force. Pat dry using gauze sponges, not tissue or cotton balls. Peri-Wound Care Topical Primary Dressing Silvercel Small 2x2 (in/in) Discharge Instruction: Apply Silvercel Small 2x2 (in/in) as instructed Secondary Dressing Gauze heel cup Discharge Instruction: apply over dressing to protect heel Secured With 15M Medipore H Soft Cloth Surgical Tape, 2x2 (in/yd) Coban Cohesive Bandage 4x5 (yds) Stretched Discharge Instruction: Apply Coban as directed. Kerlix Roll Sterile or  Non-Sterile 6-ply 4.5x4 (yd/yd) Discharge Instruction: Apply Kerlix as directed Compression Wrap Compression Stockings Add-Ons Electronic  Signature(s) Signed: 09/01/2021 4:13:54 PM By: [name] Entered By: [name] on 09/01/2021 13:11:16 [name] ([id number]) -------------------------------------------------------------------------------- [place] Details Patient Name: [name] Date of Service: 09/01/2021 1:00 PM Medical Record Number: [id number] Patient Account Number: [id number] Date of Birth/Sex: 1946/07/29 (75 y.o. M) Treating RN: [name] Primary Care Treston Coker: [name] Other Clinician: Referring Abubakar Crispo: [name] Treating Cabe [name]/Extender: [name] in Treatment: 5 Vital Signs Time Taken: 13:15 Temperature (F): 98.3 Height (in): 69 Pulse (bpm): 77 Weight (lbs): 267 Respiratory Rate (breaths/min): 18 Body Mass Index (BMI): 39.4 Blood Pressure (mmHg): 133/78 Reference Range: 80 - 120 mg / dl Electronic Signature(s) Signed: 09/01/2021 4:13:54 PM By: [name] Entered ByDonnamarie [name] on 09/01/2021 13:06:32

## 2021-09-02 NOTE — Telephone Encounter (Addendum)
Tried calling patient today to inform him of appts but no answer, message left for pt to call office.  Will mail letter to patient.

## 2021-09-04 ENCOUNTER — Ambulatory Visit
Admission: RE | Admit: 2021-09-04 | Discharge: 2021-09-04 | Disposition: A | Discharge status: Home / Self Care | Payer: Medicare HMO | Attending: Internal Medicine | Admitting: Internal Medicine

## 2021-09-04 ENCOUNTER — Encounter: Payer: Self-pay | Admitting: Internal Medicine

## 2021-09-04 ENCOUNTER — Ambulatory Visit: Payer: Medicare HMO | Admitting: Anesthesiology

## 2021-09-04 ENCOUNTER — Encounter
Admission: RE | Disposition: A | Discharge status: Home / Self Care | Payer: Self-pay | Source: Home / Self Care | Attending: Internal Medicine

## 2021-09-04 ENCOUNTER — Other Ambulatory Visit: Payer: Self-pay

## 2021-09-04 DIAGNOSIS — I4891 Unspecified atrial fibrillation: Secondary | ICD-10-CM

## 2021-09-04 DIAGNOSIS — I480 Paroxysmal atrial fibrillation: Secondary | ICD-10-CM | POA: Insufficient documentation

## 2021-09-04 DIAGNOSIS — Z7901 Long term (current) use of anticoagulants: Secondary | ICD-10-CM | POA: Diagnosis not present

## 2021-09-04 DIAGNOSIS — Z79899 Other long term (current) drug therapy: Secondary | ICD-10-CM | POA: Diagnosis not present

## 2021-09-04 HISTORY — DX: Presence of unspecified artificial hip joint: Z96.649

## 2021-09-04 HISTORY — PX: CARDIOVERSION: SHX1299

## 2021-09-04 LAB — GLUCOSE, CAPILLARY: Glucose-Capillary: 116 mg/dL — ABNORMAL HIGH (ref 70–99)

## 2021-09-04 LAB — PROTIME-INR
INR: 2.1 — ABNORMAL HIGH (ref 0.8–1.2)
Prothrombin Time: 23.3 seconds — ABNORMAL HIGH (ref 11.4–15.2)

## 2021-09-04 SURGERY — CARDIOVERSION
Anesthesia: General

## 2021-09-04 MED ORDER — PROPOFOL 10 MG/ML IV BOLUS
INTRAVENOUS | Status: DC | PRN
  Administered 2021-09-04: 70 mg via INTRAVENOUS

## 2021-09-04 MED ORDER — SODIUM CHLORIDE 0.9 % IV SOLN: INTRAVENOUS | Status: DC

## 2021-09-04 NOTE — Anesthesia Preprocedure Evaluation (Signed)
Anesthesia Evaluation  Patient identified by MRN, date of birth, ID band Patient awake    Reviewed: Allergy & Precautions, H&P , NPO status , Patient's Chart, lab work & pertinent test results  History of Anesthesia Complications Negative for: history of anesthetic complications  Airway Mallampati: III  TM Distance: <3 FB Neck ROM: Limited    Dental no notable dental hx. (+) Upper Dentures, Lower Dentures   Pulmonary neg pulmonary ROS, shortness of breath, neg sleep apnea, neg COPD, Patient abstained from smoking.Not current smoker, former smoker,  SOB likely 2/2 recent afib   Pulmonary exam normal breath sounds clear to auscultation       Cardiovascular Exercise Tolerance: Good METShypertension, + CAD, + Peripheral Vascular Disease and +CHF  (-) Past MI + dysrhythmias Atrial Fibrillation + Valvular Problems/Murmurs  Rhythm:Irregular Rate:Normal - Systolic murmurs TTE 5102: INTERPRETATION  NORMAL LEFT VENTRICULAR SYSTOLIC FUNCTION  NORMAL RIGHT VENTRICULAR SYSTOLIC FUNCTION  MODERATE MR + TR NO VALVULAR STENOSIS  NORMAL FUNCTION OF AORTIC BIOPROSTHESIS     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 CVA, No Residual Symptoms negative neurological ROS  negative psych ROS   GI/Hepatic Neg liver ROS, GERD  Controlled and Medicated,  Endo/Other  diabetes, Type 2  Renal/GU negative Renal ROS  negative genitourinary   Musculoskeletal  (+) Arthritis ,   Abdominal   Peds  Hematology  (+) anemia , Awaiting bone marrow biopsy for anemia workup   Anesthesia Other Findings Past Medical History: No date: CHF (congestive heart failure) (HCC) No date: Chronic pain No date: Coronary artery disease No date: DDD (degenerative disc disease), hip No date: Diabetes mellitus without complication (HCC) No date: DJD (degenerative joint disease) No date: Dysrhythmia No date: GERD (gastroesophageal reflux disease) No date:  Hyperlipemia No date: Hypertension No date: Insomnia No date: Neuropathy No date: Paroxysmal A-fib (HCC) No date: Peripheral neuropathy No date: Stroke [facility]) No date: Thoracic aortic atherosclerosis ([place])  Past Surgical History: No date: BACK SURGERY No date: CARDIAC SURGERY No date: CARDIAC VALVE REPLACEMENT 05/04/2017: CARDIOVERSION; N/A     Comment:  Procedure: CARDIOVERSION;  Surgeon: Corey Skains,               MD;  Location: [facility] ORS;  Service: Cardiovascular;                Laterality: N/A; No date: carpel tunnel release No date: CATARACT EXTRACTION W/ INTRAOCULAR LENS IMPLANT; Bilateral No date: COLONOSCOPY No date: COLONOSCOPY WITH ESOPHAGOGASTRODUODENOSCOPY (EGD) No date: JOINT REPLACEMENT 10/08/2016: TEE WITHOUT CARDIOVERSION; N/A     Comment:  Procedure: TRANSESOPHAGEAL ECHOCARDIOGRAM (TEE);                Surgeon: Teodoro Spray, MD;  Location: [facility] ORS;                Service: Cardiovascular;  Laterality: N/A; 11/23/2016: TEE WITHOUT CARDIOVERSION; N/A     Comment:  Procedure: TRANSESOPHAGEAL ECHOCARDIOGRAM (TEE);                Surgeon: Isaias Cowman, MD;  Location: [facility] ORS;                Service: Cardiovascular;  Laterality: N/A; No date: TONSILLECTOMY 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     Reproductive/Obstetrics negative OB ROS                             Anesthesia Physical  Anesthesia Plan  ASA: 4  Anesthesia Plan:  General   Post-op Pain Management:    Induction: Intravenous  PONV Risk Score and Plan: 2 and Propofol infusion and TIVA  Airway Management Planned: Natural Airway and Nasal Cannula  Additional Equipment: None  Intra-op Plan:   Post-operative Plan:   Informed Consent: I have reviewed the patients History and Physical, chart, labs and discussed the procedure including the risks, benefits and alternatives for the proposed anesthesia with the patient or authorized representative who has indicated his/her  understanding and acceptance.     Dental Advisory Given  Plan Discussed with: Anesthesiologist, CRNA and Surgeon  Anesthesia Plan Comments: (Patient consented for risks of anesthesia including but not limited to:  - adverse reactions to medications - risk of intubation if required - damage to teeth, lips or other oral mucosa - sore throat or hoarseness - Damage to heart, brain, lungs or loss of life  Patient voiced understanding.)        Anesthesia Quick Evaluation

## 2021-09-04 NOTE — Anesthesia Postprocedure Evaluation (Signed)
Anesthesia Post Note  Patient: [name]  Procedure(s) Performed: CARDIOVERSION  Patient location during evaluation: Specials Recovery Anesthesia Type: General Level of consciousness: awake and alert Pain management: pain level controlled Vital Signs Assessment: post-procedure vital signs reviewed and stable Respiratory status: spontaneous breathing, nonlabored ventilation, respiratory function stable and patient connected to nasal cannula oxygen Cardiovascular status: blood pressure returned to baseline and stable Postop Assessment: no apparent nausea or vomiting Anesthetic complications: no   No notable events documented.   Last Vitals:  Vitals:   09/04/21 0749 09/04/21 0750  BP:  (!) 108/56  Pulse: (!) 50 (!) 52  Resp: 17 17  Temp:    SpO2: 97% 98%    Last Pain:  Vitals:   09/04/21 0800  TempSrc:   PainSc: 0-No pain                 Arita Miss

## 2021-09-04 NOTE — CV Procedure (Signed)
Electrical Cardioversion Procedure Note NIRANJAN RUFENER [id number] Oct 21, 1945  Procedure: Electrical Cardioversion Indications:  Paroxysmal non valvular atrial fibrillation  Procedure Details Consent: Risks of procedure as well as the alternatives and risks of each were explained to the (patient/caregiver).  Consent for procedure obtained. Time Out: Verified patient identification, verified procedure, site/side was marked, verified correct patient position, special equipment/implants available, medications/allergies/relevent history reviewed, required imaging and test results available.  Performed  Patient placed on cardiac monitor, pulse oximetry, supplemental oxygen as necessary.  Sedation given: Propofol and versed as per anesthesia  Pacer pads placed anterior and posterior chest.  Cardioverted 1 time(s).  Cardioverted at 120J.  Evaluation Findings: Post procedure EKG shows: NSR Complications: None Patient did tolerate procedure well.   Serafina Royals M.D. [facility] 09/04/2021, 7:51 AM

## 2021-09-04 NOTE — Transfer of Care (Signed)
Immediate Anesthesia Transfer of Care Note  Patient: [name]  Procedure(s) Performed: CARDIOVERSION  Patient Location: spu  Anesthesia Type:General  Level of Consciousness: awake  Airway & Oxygen Therapy: Patient Spontanous Breathing and Patient connected to nasal cannula oxygen  Post-op Assessment: Report given to RN and Post -op Vital signs reviewed and stable  Post vital signs: Reviewed  Last Vitals:  Vitals Value Taken Time  BP 108/56 09/04/21 0750  Temp    Pulse 52 09/04/21 0750  Resp 17 09/04/21 0750  SpO2 98 % 09/04/21 0750    Last Pain:  Vitals:   09/04/21 0729  TempSrc: Oral  PainSc: 0-No pain         Complications: No notable events documented.

## 2021-09-05 ENCOUNTER — Other Ambulatory Visit: Payer: Self-pay | Admitting: Student

## 2021-09-08 ENCOUNTER — Ambulatory Visit
Admission: RE | Admit: 2021-09-08 | Discharge: 2021-09-08 | Disposition: A | Discharge status: Home / Self Care | Payer: Medicare HMO | Source: Ambulatory Visit | Attending: Oncology | Admitting: Oncology

## 2021-09-08 DIAGNOSIS — D649 Anemia, unspecified: Secondary | ICD-10-CM | POA: Insufficient documentation

## 2021-09-08 LAB — CBC WITH DIFFERENTIAL/PLATELET
Abs Immature Granulocytes: 0.05 10*3/uL (ref 0.00–0.07)
Basophils Absolute: 0.1 10*3/uL (ref 0.0–0.1)
Basophils Relative: 1 %
Eosinophils Absolute: 0.5 10*3/uL (ref 0.0–0.5)
Eosinophils Relative: 6 %
HCT: 33.9 % — ABNORMAL LOW (ref 39.0–52.0)
Hemoglobin: 10.3 g/dL — ABNORMAL LOW (ref 13.0–17.0)
Immature Granulocytes: 1 %
Lymphocytes Relative: 14 %
Lymphs Abs: 1.2 10*3/uL (ref 0.7–4.0)
MCH: 28.7 pg (ref 26.0–34.0)
MCHC: 30.4 g/dL (ref 30.0–36.0)
MCV: 94.4 fL (ref 80.0–100.0)
Monocytes Absolute: 0.9 10*3/uL (ref 0.1–1.0)
Monocytes Relative: 10 %
Neutro Abs: 5.7 10*3/uL (ref 1.7–7.7)
Neutrophils Relative %: 68 %
Platelets: 297 10*3/uL (ref 150–400)
RBC: 3.59 MIL/uL — ABNORMAL LOW (ref 4.22–5.81)
RDW: 15.9 % — ABNORMAL HIGH (ref 11.5–15.5)
WBC: 8.3 10*3/uL (ref 4.0–10.5)
nRBC: 0 % (ref 0.0–0.2)

## 2021-09-08 LAB — GLUCOSE, CAPILLARY: Glucose-Capillary: 141 mg/dL — ABNORMAL HIGH (ref 70–99)

## 2021-09-08 MED ORDER — HEPARIN SOD (PORK) LOCK FLUSH 100 UNIT/ML IV SOLN
INTRAVENOUS | Status: AC
  Filled 2021-09-08: qty 5

## 2021-09-08 MED ORDER — SODIUM CHLORIDE 0.9 % IV SOLN: INTRAVENOUS | Status: DC

## 2021-09-08 MED ORDER — FENTANYL CITRATE (PF) 100 MCG/2ML IJ SOLN
INTRAMUSCULAR | Status: AC | PRN
  Administered 2021-09-08: 50 ug via INTRAVENOUS

## 2021-09-08 MED ORDER — MIDAZOLAM HCL 2 MG/2ML IJ SOLN
INTRAMUSCULAR | Status: AC
  Filled 2021-09-08: qty 2

## 2021-09-08 MED ORDER — FENTANYL CITRATE (PF) 100 MCG/2ML IJ SOLN
INTRAMUSCULAR | Status: AC
  Filled 2021-09-08: qty 2

## 2021-09-08 MED ORDER — MIDAZOLAM HCL 2 MG/2ML IJ SOLN
INTRAMUSCULAR | Status: AC | PRN
  Administered 2021-09-08: 1 mg via INTRAVENOUS

## 2021-09-08 NOTE — Procedures (Signed)
Interventional Radiology Procedure Note  Date of Procedure: 09/08/2021  Procedure: BMBx  Findings:  1. CT guided BMBx right posterior ilium    Complications: No immediate complications noted.   Estimated Blood Loss: minimal  Follow-up and Recommendations: 1. Bedrest 1 hour   Albin Felling, MD  Vascular & Interventional Radiology  09/08/2021 9:45 AM

## 2021-09-08 NOTE — Progress Notes (Signed)
Patient clinically stable post BMB per Dr  Denna Haggard, tolerated well. Received Versed 1 mg along with Fentanyl 50 mcg IV for procedure. Report given to [place] Rn in specials post procedure.

## 2021-09-08 NOTE — H&P (Signed)
Chief Complaint: Patient was seen in consultation today for anemia at the request of Finnegan,Timothy J  Referring Physician(s): Finnegan,Timothy J  Supervising Physician: Juliet Rude  Patient Status: [facility] - Out-pt  History of Present Illness: [name] is a 75 y.o. male with anemia of uncertain etiology who has been seen by Hematology, Dr. Grayland Ormond on 11/2 without medical changes since.   The patient has had a H&P performed within the last 30 days, all history, medications, and exam have been reviewed. The patient denies any interval changes since the H&P.  The patient denies any current chest pain, shortness of breath. The patient denies any history of sleep apnea or chronic oxygen use. He has no known complications to sedation.   He has been scheduled today for elective image guided bone marrow biopsy with moderate sedation.   Past Medical History:  Diagnosis Date   Anemia    CHF (congestive heart failure) (HCC)    Chronic pain    Coronary artery disease    DDD (degenerative disc disease), hip    Diabetes mellitus without complication (HCC)    DJD (degenerative joint disease)    Dysrhythmia    GERD (gastroesophageal reflux disease)    History of hip replacement    Hyperlipemia    Hypertension    Insomnia    Neuropathy    Paroxysmal A-fib ([place])    Peripheral neuropathy    Stroke [facility])    Thoracic aortic atherosclerosis ([place])     Past Surgical History:  Procedure Laterality Date   BACK SURGERY     CARDIAC SURGERY     CARDIAC VALVE REPLACEMENT     CARDIOVERSION N/A 05/04/2017   Procedure: CARDIOVERSION;  Surgeon: Corey Skains, MD;  Location: [facility] ORS;  Service: Cardiovascular;  Laterality: N/A;   CARDIOVERSION N/A 09/04/2021   Procedure: CARDIOVERSION;  Surgeon: Corey Skains, MD;  Location: [facility] ORS;  Service: Cardiovascular;  Laterality: N/A;   carpel tunnel release     CATARACT EXTRACTION W/ INTRAOCULAR LENS IMPLANT Bilateral     COLONOSCOPY     COLONOSCOPY WITH ESOPHAGOGASTRODUODENOSCOPY (EGD)     COLONOSCOPY WITH PROPOFOL N/A 05/16/2018   Procedure: COLONOSCOPY WITH PROPOFOL;  Surgeon: Manya Silvas, MD;  Location: [facility] ENDOSCOPY;  Service: Endoscopy;  Laterality: N/A;   COLONOSCOPY WITH PROPOFOL N/A 08/06/2021   Procedure: COLONOSCOPY WITH PROPOFOL;  Surgeon: Toledo, Benay Pike, MD;  Location: [facility] ENDOSCOPY;  Service: Gastroenterology;  Laterality: N/A;   ESOPHAGOGASTRODUODENOSCOPY (EGD) WITH PROPOFOL N/A 05/16/2018   Procedure: ESOPHAGOGASTRODUODENOSCOPY (EGD) WITH PROPOFOL;  Surgeon: Manya Silvas, MD;  Location: [facility] ENDOSCOPY;  Service: Endoscopy;  Laterality: N/A;   JOINT REPLACEMENT     TEE WITHOUT CARDIOVERSION N/A 10/08/2016   Procedure: TRANSESOPHAGEAL ECHOCARDIOGRAM (TEE);  Surgeon: Teodoro Spray, MD;  Location: [facility] ORS;  Service: Cardiovascular;  Laterality: N/A;   TEE WITHOUT CARDIOVERSION N/A 11/23/2016   Procedure: TRANSESOPHAGEAL ECHOCARDIOGRAM (TEE);  Surgeon: Isaias Cowman, MD;  Location: [facility] ORS;  Service: Cardiovascular;  Laterality: N/A;   TONSILLECTOMY     VSD REPAIR      Allergies: Tetracycline and Tetracyclines & related  Medications: Prior to Admission medications   Medication Sig Start Date End Date Taking? Authorizing Provider  amiodarone (PACERONE) 200 MG tablet Take 1 tablet (200 mg total) by mouth 2 (two) times daily. 08/21/21  Yes Lorella Nimrod, MD  aspirin EC 81 MG tablet Take 81 mg by mouth daily.   Yes [provider]  cyanocobalamin (,VITAMIN B-12,)  1000 MCG/ML injection Inject 1,000 mcg into the muscle every 30 (thirty) days.   Yes [provider]  cyclobenzaprine (FLEXERIL) 10 MG tablet Take 10 mg by mouth daily as needed for muscle spasms.    Yes [provider]  DULoxetine (CYMBALTA) 30 MG capsule Take 30 mg by mouth every evening.   Yes [provider]  ferrous sulfate 325 (65 FE) MG tablet Take 1 tablet (325 mg total) by  mouth 2 (two) times daily with a meal. 11/03/17  Yes Gladstone Lighter, MD  gabapentin (NEURONTIN) 600 MG tablet Take 600 mg by mouth 2 (two) times daily.   Yes [provider]  glipiZIDE (GLUCOTROL XL) 5 MG 24 hr tablet Take 5 mg by mouth daily. 09/18/20 09/18/21 Yes [provider]  loratadine (CLARITIN) 10 MG tablet Take 10 mg by mouth daily.   Yes [provider]  metFORMIN (GLUCOPHAGE) 1000 MG tablet Take 1,000 mg by mouth 2 (two) times daily. With meals. 03/19/16  Yes [provider]  methadone (DOLOPHINE) 5 MG tablet Take 5 mg by mouth 2 (two) times daily.  09/02/16  Yes [provider]  methimazole (TAPAZOLE) 10 MG tablet Take 20 mg by mouth daily. 11/27/20  Yes [provider]  metoprolol tartrate (LOPRESSOR) 25 MG tablet Take 25 mg by mouth 2 (two) times daily.  07/20/16  Yes [provider]  Multiple Vitamin (MULTI-VITAMINS) TABS Take 1 tablet by mouth daily.   Yes [provider]  pravastatin (PRAVACHOL) 40 MG tablet Take 40 mg by mouth every evening.  02/10/16  Yes [provider]  torsemide (DEMADEX) 20 MG tablet Take by mouth daily. 07/05/20  Yes [provider]  warfarin (COUMADIN) 2.5 MG tablet Take 2.5 mg by mouth. Take on Monday and Friday only   Yes [provider]  warfarin (COUMADIN) 5 MG tablet Take 5 mg by mouth daily. Take on Tuesday, Wednesday, Thursday, Saturday, and Sunday   Yes [provider]  KLOR-CON M20 20 MEQ tablet Take by mouth. Patient not taking: No sig reported 07/05/20   [provider]  pantoprazole (PROTONIX) 40 MG tablet Take 40 mg by mouth daily. Patient not taking: No sig reported    [provider]     Family History  Problem Relation Age of Onset   Stroke Mother    Heart attack Mother    Heart attack Father     Social History   Socioeconomic History   Marital status: Married    Spouse name: Not on file   Number of children:  Not on file   Years of education: Not on file   Highest education level: Not on file  Occupational History   Not on file  Tobacco Use   Smoking status: Former    Types: Cigarettes    Quit date: 07/09/1996    Years since quitting: 25.1   Smokeless tobacco: Never  Vaping Use   Vaping Use: Never used  Substance and Sexual Activity   Alcohol use: Not Currently    Comment: occasional   Drug use: No   Sexual activity: Yes  Other Topics Concern   Not on file  Social History Narrative   Not on file   Social Determinants of Health   Financial Resource Strain: Not on file  Food Insecurity: Not on file  Transportation Needs: Not on file  Physical Activity: Not on file  Stress: Not on file  Social Connections: Not on file    Review  of Systems: A 12 point ROS discussed and pertinent positives are indicated in the HPI above.  All other systems are negative.  Review of Systems  Vital Signs: BP (!) 152/74   Pulse (!) 58   Temp 98.3 F (36.8 C) (Oral)   Resp 16   Ht '5\' 10"'  (1.778 m)   Wt 269 lb (122 kg)   SpO2 100%   BMI 38.60 kg/m   Physical Exam Constitutional:      Appearance: Normal appearance.  HENT:     Head: Normocephalic and atraumatic.  Cardiovascular:     Rate and Rhythm: Regular rhythm. Bradycardia present.  Pulmonary:     Effort: Pulmonary effort is normal. No respiratory distress.     Breath sounds: Normal breath sounds.  Skin:    General: Skin is warm and dry.  Neurological:     Mental Status: He is alert and oriented to person, place, and time.    Imaging: CT ABDOMEN PELVIS WO CONTRAST  Result Date: 08/19/2021 CLINICAL DATA:  HGB dropped without any obvious bleeding, requiring blood transfusion, want to rule out any retroperitoneal bleed. EXAM: CT ABDOMEN AND PELVIS WITHOUT CONTRAST TECHNIQUE: Multidetector CT imaging of the abdomen and pelvis was performed following the standard protocol without IV contrast. COMPARISON:  Contrast enhanced CT  12/28/2018 FINDINGS: Lower chest: Prosthetic aortic valve. Decreased density of the blood pool consistent with anemia. There is mild elevation of left hemidiaphragm. No acute airspace disease or pleural effusion. Bilateral gynecomastia. Hepatobiliary: Mild hepatic steatosis without focal hepatic lesion on this unenhanced exam. Gallbladder partially distended, no calcified stone. No biliary dilatation. Pancreas: Mild fatty atrophy.  No ductal dilatation or inflammation. Spleen: Normal in size without focal abnormality. Adrenals/Urinary Tract: Normal adrenal glands. No hydronephrosis or renal calculi. There is faint bilateral perinephric edema that is chronic and similar to prior exam. The previous cyst in the lower left kidney is not well delineated on the current exam. Physiologically distended urinary bladder. No bladder wall thickening. Stomach/Bowel: Small hiatal hernia. Stomach is decompressed. There is a small duodenal diverticulum. Occasional fecalization of distal small bowel contents. No small bowel inflammation or obstruction. Normal appendix. Moderate volume of stool throughout the colon. There is no colonic wall thickening or pericolonic edema. Vascular/Lymphatic: Moderate aortic atherosclerosis. No aortic aneurysm. There is no retroperitoneal or portal venous gas. There is no enlargement in the trip air today and suggest retroperitoneal hemorrhage. There is no enlargement of the abdominal wall musculature to suggest rectus sheath hematoma. No hemoperitoneum or free fluid. No enlarged lymph nodes in the abdomen or pelvis. Reproductive: Grossly unremarkable prostate gland, partially obscured by streak artifact from right hip arthroplasty. Other: No free fluid or free air. Tiny fat containing umbilical hernia. There is fat within the left inguinal canal. No evidence of abdominopelvic hemorrhage or hematoma. No body wall abnormality. Musculoskeletal: Right hip arthroplasty. Posterior lumbar fusion L4 through  S1. 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 No acute osseous abnormalities are seen. Remote left eleventh rib fracture. IMPRESSION: 1. No evidence of abdominopelvic hemorrhage or acute abnormality. No retroperitoneal or rectus sheath hematoma. 2. Moderate colonic stool burden with fecalization of distal small bowel contents, suggesting slow transit. No bowel obstruction or inflammation. 3. Mild hepatic steatosis. Aortic Atherosclerosis (ICD10-I70.0). Electronically Signed   By: Keith Rake M.D.   On: 08/19/2021 17:09   DG Chest 2 View  Result Date: 08/17/2021 CLINICAL DATA:  SOB EXAM: CHEST - 2 VIEW COMPARISON:  None. FINDINGS: Status  post prosthetic heart valve. Median sternotomy. Anterior cervical fusion hardware. The heart size is within normal limits. No pleural effusion. No pneumothorax. No mass or consolidation. No acute osseous abnormality. Calcifications of the aortic arch. IMPRESSION: No acute findings in the chest. Electronically Signed   By: Albin Felling M.D.   On: 08/17/2021 09:15    Labs:  CBC: Recent Labs    08/19/21 0503 08/19/21 1828 08/20/21 0552 08/21/21 0505 09/08/21 0810  WBC 14.0*  --  10.6* 9.1 8.3  HGB 6.8* 8.0* 7.6* 8.5* 10.3*  HCT 21.8* 23.6* 22.2* 25.9* 33.9*  PLT 263  --  214 216 297    COAGS: Recent Labs    08/19/21 0503 08/20/21 0847 08/21/21 0505 09/04/21 0714  INR 2.7* 2.3* 2.2* 2.1*    BMP: Recent Labs    08/18/21 0649 08/19/21 0503 08/20/21 0552 08/21/21 0505  NA 136 137 137 137  K 3.9 4.0 3.4* 3.5  CL 104 105 103 104  CO2 '23 28 28 27  ' GLUCOSE 254* 180* 161* 153*  BUN 61* 40* 26* 19  CALCIUM 8.0* 8.3* 7.7* 7.8*  CREATININE 0.82 0.93 0.84 0.85  GFRNONAA >60 >60 >60 >60    LIVER FUNCTION TESTS: Recent Labs    08/18/21 0649  BILITOT 0.3  AST 18  ALT 15  ALKPHOS 71  PROT 6.5  ALBUMIN 2.9*    Assessment and Plan: 75 year old male with anemia of uncertain etiology who has been seen by  Hematology, Dr. Grayland Ormond on 11/2 without medical changes since.   He has been scheduled today for elective image guided bone marrow biopsy with moderate sedation.   The patient has been NPO, labs and vitals have been reviewed.  Risks and benefits of image guided bone marrow biopsy with sedation was discussed with the patient and/or patient's family including, but not limited to bleeding, infection, damage to adjacent structures or low yield requiring additional tests.  All of the questions were answered and there is agreement to proceed.  Consent signed and in chart.    Thank you for this interesting consult.  I greatly enjoyed meeting [name] and look forward to participating in their care.  A copy of this report was sent to the requesting provider on this date.  Electronically Signed: Hedy Jacob, PA-C 09/08/2021, 9:12 AM   I spent a total of 15 Minutes in face to face in clinical consultation, greater than 50% of which was counseling/coordinating care for anemia.

## 2021-09-15 ENCOUNTER — Encounter: Payer: Medicare HMO | Admitting: Physician Assistant

## 2021-09-15 ENCOUNTER — Other Ambulatory Visit: Payer: Self-pay

## 2021-09-15 DIAGNOSIS — E11621 Type 2 diabetes mellitus with foot ulcer: Secondary | ICD-10-CM | POA: Diagnosis not present

## 2021-09-15 NOTE — Progress Notes (Addendum)
[name] ([id number]) Visit Report for 09/15/2021 Chief Complaint Document Details Patient Name: [name], [name]. Date of Service: 09/15/2021 3:15 PM Medical Record Number: [id number] Patient Account Number: [id number] Date of Birth/Sex: 1946/07/15 (75 y.o. M) Treating RN: Donnamarie Poag Primary Care Provider: Fulton Reek Other Clinician: Referring Provider: Fulton Reek Treating Provider/Extender: Skipper Cliche in Treatment: 7 Information Obtained from: Patient Chief Complaint Right LE ulcers and cellulitis Electronic Signature(s) Signed: 09/15/2021 3:50:40 PM By: Worthy Keeler PA-C Entered By: Worthy Keeler on 09/15/2021 15:50:40 [name] ([id number]) -------------------------------------------------------------------------------- Debridement Details Patient Name: [name], [name]. Date of Service: 09/15/2021 3:15 PM Medical Record Number: [id number] Patient Account Number: [id number] Date of Birth/Sex: [birth year] (75 y.o. M) Treating RN: Donnamarie Poag Primary Care Provider: Fulton Reek Other Clinician: Referring Provider: Fulton Reek Treating Provider/Extender: Skipper Cliche in Treatment: 7 Debridement Performed for Wound #9 Left Calcaneus Assessment: Performed By: Physician Tommie Sams., PA-C Debridement Type: Debridement Severity of Tissue Pre Debridement: Fat layer exposed Level of Consciousness (Pre- Awake and Alert procedure): Pre-procedure Verification/Time Out Yes - 15:53 Taken: Start Time: 15:54 Pain Control: Lidocaine Total Area Debrided (L x W): 3.3 (cm) x 3.5 (cm) = 11.55 (cm) Tissue and other material Viable, Non-Viable, Callus, Slough, Subcutaneous, Slough debrided: Level: Skin/Subcutaneous Tissue Debridement Description: Excisional Instrument: Curette, Forceps, Scissors Bleeding: Minimum Hemostasis Achieved: Pressure Response to Treatment: Procedure was tolerated well Level of Consciousness (Post- Awake  and Alert procedure): Post Debridement Measurements of Total Wound Length: (cm) 3.3 Width: (cm) 3.5 Depth: (cm) 0.1 Volume: (cm) 0.907 Character of Wound/Ulcer Post Debridement: Improved Severity of Tissue Post Debridement: Fat layer exposed Post Procedure Diagnosis Same as Pre-procedure Electronic Signature(s) Signed: 09/15/2021 4:28:37 PM By: Donnamarie Poag Signed: 09/15/2021 5:14:38 PM By: Worthy Keeler PA-C Entered By: Donnamarie Poag on 09/15/2021 16:03:15 [name] ([id number]) -------------------------------------------------------------------------------- HPI Details Patient Name: [name]. Date of Service: 09/15/2021 3:15 PM Medical Record Number: [id number] Patient Account Number: [id number] Date of Birth/Sex: June 20, 1946 (75 y.o. M) Treating RN: Donnamarie Poag Primary Care Provider: Fulton Reek Other Clinician: Referring Provider: Fulton Reek Treating Provider/Extender: Skipper Cliche in Treatment: 7 History of Present Illness HPI Description: 09/24/2020 on evaluation today patient presents today for a heel ulcer that he tells me has been present for about 2 years. He has been seeing podiatry and they have been attempting to manage this including what sounds to be a total contact cast, Unna boot, and just standard dressings otherwise as well. Most recently has been using triple antibiotic ointment. With that being said unfortunately despite everything he really has not had any significant improvement. He tells me that he cannot even really remember exactly how this began but he presumed it may have rubbed on his shoes or something of that nature. 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 He also does have chronic pain in the way of neuropathy which he takes medications for including Cymbalta and  methadone. He tells me that this does seem to help. Fortunately there is no signs of active infection at this time. His most recent hemoglobin A1c was 8.1 though he knows this was this year he cannot tell me the exact time. His fluid pills currently to help with some of the lower extremity edema although he does obviously have signs of venous stasis/lymphedema. Currently  there is no evidence of active infection. No fevers, chills, nausea, vomiting, or diarrhea. Patient has had fairly recent ABIs which were performed on 07/19/2020 and revealed that he has normal findings in both the ankle and toe locations bilaterally. His ABI on the right was 1.09 on the left was 1.08 with a TBI on the right of 0.88 and on the left of 0.94. Triphasic flow was noted throughout. 10/08/2020 on evaluation today patient appears to be doing pretty well in regard to his left heel currently in fact this is doing a great job and seems to be healing quite nicely. Unfortunately on his right leg he had a pile of wood that actually fell on him injuring his right leg this is somewhat erythematous has me concerned little bit about cellulitis though there is not really a good area to culture at this point. 10/24/2020 upon evaluation today patient appears to be doing well with regard to his heel ulcer. He is showing signs of improvement which is great news. His right leg is completely healed. Overall I feel like he is doing excellent and there is no signs of infection. 11/07/2020 upon evaluation today patient appears to be doing well with regard to his heel ulcer. He tells me that last week when he was unable to come in his wife actually thought that the wound was very close to closing if not closed. Then it began to "reopen again". I really feel like what may have happened as the collagen may have dried over the wound bed and that because that misunderstanding with thinking that the wound was healing. With that being said I did not see it  last week I do not know that for certain. Either way I feel like he is doing great today I see no signs of infection at this point. 11/26/2020 upon inspection today patient appears to be doing decently well in regard to his heel ulcer. He has been tolerating the dressing changes without complication. Fortunately there is no sign of active infection at this time. No fevers, chills, nausea, vomiting, or diarrhea. 12/10/2020 upon evaluation today patient appears to be doing fairly well in regard to the wound on his heel as well as what appears to be a new wound of the left first metatarsal head plantar aspect. This seems to be an area that was callus that has split as the patient tells me has been trying to walk on his toes more has probably where this came from. With that being said there does not appear to be signs of active infection which is great news. 12/17/2020 upon evaluation today patient actually appears to be making good progress currently. Fortunately there is no evidence of active infection at this time. Overall I feel like he is very close to complete closure. 12/26/2020 upon evaluation today patient appears to be doing excellent in regard to his wounds. In fact I am not certain that these are not even completely healed on initial inspection. Overall I am very pleased with where things stand today. Good news is that they are healed he is actually get ready to go out of town and that will be helpful as well as he will be a full part of the time. Readmission: 02/04/2021 upon evaluation today patient appears to be doing well at this point in regard to his left heel that I previously saw him for. Unfortunately he is having issues with his right lower extremity. He has significant wounds at this point he also has  erythema noted there is definite signs of cellulitis which is unfortunate. With that being said I think we do need to address this sooner rather than later. The good news is he did have a  nice trip to the beach. He tells me that he had no issues during that time. 4/27; patient on Bactrim. Culture showed methicillin sensitive staph aureus therefore the Bactrim should be effective. 2 small areas on the right leg are healed the area on the mid aspect of the tibia almost 100% covered in a very adherent necrotic debris. We have been using silver alginate 02/20/2021 upon evaluation today patient appears to be doing well with regard to his wound. He is showing signs of improvement which is great news overall very pleased with where things stand today. No fevers, chills, nausea, vomiting, or diarrhea. 02/27/2021 upon evaluation today patient appears to be doing well with regard to his leg ulcer. He is tolerating the dressing changes and overall appears to be doing quite excellent. I am extremely pleased with where things stand and overall I think patient is making great progress. There is no sign of active infection at this time which is also great news. 03/06/2021 upon evaluation today patient appears to be doing excellent in regard to his wounds. In fact he appears to be completely healed today based on what I am seeing. This is excellent news and overall I am extremely pleased with where he stands. Overall the patient is happy to hear this as well this has been quite sometime coming. Readmission: 04/01/2021 patient unfortunately returns for readmission today. He tells me that he has been wearing his compression socks on the right leg daily VICTORIOUS, KUNDINGER ([id number]) and he does not really know what is going on and why his legs are doing what they are doing. We did therefore go ahead and probe deeper into exactly what has been going on with him today. Subsequently the patient tells me that when he gets up and what we would call "first thing in the morning" are really 2 different things". He does not tend to sleep well so he tells me that he will often wake up at 3:00 in the morning. He  will then potentially going into the living room to get in his chair where he may read a book for a little while and then potentially fall asleep back in his chair. He then subsequently wake up around 5:00 or so and then get up and in his words "putter around". This often will end with him proceeding at some point around 8 AM or 9 AM to putting on his compression socks. With that being said this means that anywhere from the 3:00 in the morning till roughly around 9:00 in the morning he has no compression on yet he is sitting in his recliner, walking around and up and about, and this is at least 5 to 6 hours of noncompressed time. That may be our issue here but I did not realize until we discussed this further today. Fortunately there does not appear to be any signs of active infection at this time which is great news. With that being said he has multiple wounds of the bilateral lower extremities. 04/10/2021 upon evaluation today patient appears to be doing well with regard to his legs for the most part. He does look like he had some injury where his wrap slid down nonetheless he did some "doctoring on them". I do feel like most of the areas honestly have  cleared back up which is great news. There does not appear to be any signs of active infection at this time which is also great news. No fevers, chills, nausea, vomiting, or diarrhea. 04/18/2021 upon evaluation today patient appears to be doing well with regard to his wounds in fact he appears to be completely healed which is great news. Fortunately there does not appear to be any signs of active infection which is great news. No fevers, chills, nausea, vomiting, or diarrhea. READMISSION 07/28/2021 This is a now 75 year old man we have had in this clinic for quite a bit of this year. 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 He wears compression stockings. 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 He comes in an old running shoes although he says he wears better shoes at home. His history is that he noticed a blister in the right posterior heel. This open. He has been applying Neosporin to it currently the wound measures 2 x 1.4 cm. 100% slough covered. Not really offloading this in any rigorous way. Past medical history is essentially unchanged he has paroxysmal atrial fibrillation on Coumadin type 2 diabetes with a recent hemoglobin A1c of 7 on metformin. ABI in our clinic was 0.98 on the right 08/05/2021 upon evaluation today patient appears to be doing well with regard to his heel ulcer. Fortunately there does not appear to be any signs of active infection at this time. Overall I been very pleased with where things seem to be currently as far as the wound healing is concerned. Again this is first a lot of seeing him Dr. Dellia Nims readmitted him last week. Nonetheless I think we are definitely making some progress here. 08/11/2021 upon evaluation today patient's wound on the heel actually showing signs of excellent improvement. I am actually very pleased with where things stand currently. No fevers, chills, nausea, vomiting, or diarrhea. There does not appear to be any need for sharp debridement today either which is also great news. 08/25/2021 upon evaluation today patient appears to be doing well with regard to his heel ulcer. This is actually looking significantly improved compared to last time I saw him. Fortunately there does not appear to be any evidence of active infection at this time. No fevers, chills, nausea, vomiting, or diarrhea. 09/01/2021 upon evaluation today patient appears to be doing decently well in regard to his wounds. Fortunately there does not appear to be any signs of active infection at this time which is great news and overall very pleased in that regard. I do not see any evidence of active  infection systemically which is great news as well. No fevers, chills, nausea, vomiting, or diarrhea. I think that the heel is making excellent progress. 09/15/2021 upon evaluation today patient's heel unfortunately is significantly worse compared to what it was previous. I do believe that at this time he would benefit from switching back to something a little bit more offloading from the cushion shoe he has right now this is more of a heel protector what I really need is a Prevalon offloading boot which I think is good to do a lot better for him than just the small heel protector. Electronic Signature(s) Signed: 09/15/2021 4:38:08 PM By: Worthy Keeler PA-C Entered By: Worthy Keeler on 09/15/2021 16:38:07 KOBYN, KRAY ([id number]) -------------------------------------------------------------------------------- Physical Exam Details Patient Name: [name], [name] Date of Service: 09/15/2021 3:15 PM Medical Record  Number: [id number] Patient Account Number: [id number] Date of Birth/Sex: 08-01-1946 (75 y.o. M) Treating RN: Donnamarie Poag Primary Care Provider: Fulton Reek Other Clinician: Referring Provider: Fulton Reek Treating Provider/Extender: Skipper Cliche in Treatment: 7 Constitutional Well-nourished and well-hydrated in no acute distress. Respiratory normal breathing without difficulty. Psychiatric this patient is able to make decisions and demonstrates good insight into disease process. Alert and Oriented x 3. pleasant and cooperative. Notes Upon inspection patient's wound did require significant debridement today to clear away some of the necrotic debris he also had a deep tissue injury centrally which is unfortunately not good. 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 We need to try to protect his heel. Electronic  Signature(s) Signed: 09/15/2021 4:38:30 PM By: Worthy Keeler PA-C Entered By: Worthy Keeler on 09/15/2021 16:38:30 [name] ([id number]) -------------------------------------------------------------------------------- Physician Orders Details Patient Name: [name], [name]. Date of Service: 09/15/2021 3:15 PM Medical Record Number: [id number] Patient Account Number: [id number] Date of Birth/Sex: 1945-11-17 (75 y.o. 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 o Nurse Visit as needed - call to schedule Bathing/ Shower/ Hygiene o May shower with wound dressing protected with water repellent cover or cast protector. o No tub bath. Edema Control - Lymphedema / Segmental Compressive Device / Other o Patient to wear own compression stockings. Remove compression stockings every night before going to bed and put on every morning when getting up. - right leg o Elevate leg(s) parallel to the floor when sitting. o DO YOUR BEST to sleep in the bed at night. DO NOT sleep in your recliner. Long hours of sitting in a recliner leads to swelling of the legs and/or potential wounds on your backside. Off-Loading o Other: - L heel offloader shoe Keep pressure off of heel in the bed-float heel in [place] order from [employer], see sheet Additional Orders / Instructions o Follow Nutritious Diet and Increase Protein Intake - check blood sugar and eat protein for wound healing Wound Treatment Wound #9 -  Calcaneus Wound Laterality: Left Cleanser: Normal Saline Every Other Day/15 Days Discharge Instructions: Wash your hands with soap and water. Remove old dressing, discard into plastic bag and place into trash. Cleanse the wound with Normal Saline prior to applying a clean dressing using gauze sponges, not tissues or cotton balls. Do not scrub or use excessive force. Pat dry using gauze sponges, not tissue or cotton balls. Primary Dressing: Silvercel Small 2x2 (in/in) (DME) (Generic) Every Other Day/15 Days Discharge Instructions: Apply Silvercel Small 2x2 (in/in) as instructed Secondary Dressing: ABD Pad 5x9 (in/in) (DME) (Generic) Every Other Day/15 Days Discharge Instructions: Cover with ABD pad Secondary Dressing: heel cup Every Other Day/15 Days Discharge Instructions: apply over dressing to protect heel Secured With: 47M Medipore H Soft Cloth Surgical Tape, 2x2 (in/yd) (Generic) Every Other Day/15 Days Secured With: Coban Cohesive Bandage 4x5 (yds) Stretched Every Other Day/15 Days Discharge Instructions: Apply Coban as directed. Secured With: [employer]  Sterile or Non-Sterile 6-ply 4.5x4 (yd/yd) (Generic) Every Other Day/15 Days Discharge Instructions: Apply Kerlix as directed Add-Ons: Prevalon boot Every Other Day/15 Days Discharge Instructions: use while in bed RUDY, LUHMANN ([id number]) Electronic Signature(s) Signed: 09/15/2021 4:28:37 PM By: Donnamarie Poag Signed: 09/15/2021 5:14:38 PM By: Worthy Keeler PA-C Entered By: Donnamarie Poag on 09/15/2021 16:15:32 [name] ([id number]) -------------------------------------------------------------------------------- Problem List Details Patient Name: QUANG, THORPE. Date of Service: 09/15/2021 3:15 PM Medical Record Number: [id number] Patient Account Number: [id number] Date of Birth/Sex: 12/03/1945 (75 y.o. 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 Date of Service: 09/15/2021 3:15 PM Medical Record Number: [id number] Patient Account Number: [id number] Date of Birth/Sex: 1946/10/03 (75 y.o. M) Treating RN: Donnamarie Poag Primary Care Provider: Fulton Reek Other Clinician: Referring Provider: Fulton Reek Treating Provider/Extender: Skipper Cliche in Treatment: 7 Subjective Chief Complaint Information obtained from Patient Right LE ulcers and cellulitis History of Present Illness (HPI) 09/24/2020 on evaluation today patient presents today for a heel ulcer that he tells me has been present for about 2 years. He has been seeing podiatry and they have been attempting to manage this including what sounds to be a total contact cast, Unna boot, and just standard dressings otherwise as well. Most recently has been using triple antibiotic ointment. With that being said unfortunately despite everything he really has not had any significant improvement. He tells me that he cannot even really remember exactly how this began but he presumed it may have rubbed on his shoes or something of that nature. 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 He tells me that this does seem to help. Fortunately there is no signs of active infection at this time. His most recent hemoglobin A1c was 8.1 though he knows this was this year he cannot tell me the exact time. His fluid pills currently to help with some of the lower extremity edema although he does obviously have signs of venous stasis/lymphedema. Currently there is no evidence of active infection. No fevers, chills, nausea, vomiting, or diarrhea. Patient has had fairly recent ABIs which were performed on 07/19/2020 and revealed that he has normal findings in both the ankle and toe locations bilaterally. His ABI on the right was 1.09 on the left was 1.08 with a TBI on the right of 0.88 and on the left of 0.94. Triphasic flow was noted throughout. 10/08/2020 on evaluation today patient appears to be doing pretty well in regard to his left heel currently in fact this is doing a great job and seems to be healing quite nicely. Unfortunately on his right leg he had a  pile of wood that actually fell on him injuring his right leg this is somewhat erythematous has me concerned little bit about cellulitis though there is not really a good area to culture at this point. 10/24/2020 upon evaluation today patient appears to be doing well with regard to his heel ulcer. He is showing signs of improvement which is great news. His right leg is completely healed. Overall I feel like he is doing excellent and there is no signs of infection. 11/07/2020 upon evaluation today patient appears to be doing well with regard to his heel ulcer. He tells me that last week when he was unable to come in his wife actually thought that the wound was very close to closing if not closed. Then it began to "reopen again". I really  feel like what may have happened as the collagen may have dried over the wound bed and that because that misunderstanding with thinking that the wound was healing. With that being said I did not see it last week I do not know that for certain. Either way I feel like he is doing great today I see no signs of infection at this point. 11/26/2020 upon inspection today patient appears to be doing decently well in regard to his heel ulcer. He has been tolerating the dressing changes without complication. Fortunately there is no sign of active infection at this time. No fevers, chills, nausea, vomiting, or diarrhea. 12/10/2020 upon evaluation today patient appears to be doing fairly well in regard to the wound on his heel as well as what appears to be a new wound of the left first metatarsal head plantar aspect. This seems to be an area that was callus that has split as the patient tells me has been trying to walk on his toes more has probably where this came from. With that being said there does not appear to be signs of active infection which is great news. 12/17/2020 upon evaluation today patient actually appears to be making good progress currently. Fortunately there is no evidence of active infection at this time. Overall I feel like he is very close to complete closure. 12/26/2020 upon evaluation today patient appears to be doing excellent in regard to his wounds. In fact I am not certain that these are not even completely healed on initial inspection. Overall I am very pleased with where things stand today. Good news is that they are healed he is actually get ready to go out of town and that will be helpful as well as he will be a full part of the time. Readmission: 02/04/2021 upon evaluation today patient appears to be doing well at this point in regard to his left heel that I previously saw him for. Unfortunately he is having issues with his right lower extremity. He has significant wounds at this point  he also has erythema noted there is definite signs of cellulitis which is unfortunate. With that being said I think we do need to address this sooner rather than later. The good news is he did have a nice trip to the beach. He tells me that he had no issues during that time. 4/27; patient on Bactrim. Culture showed methicillin sensitive staph aureus therefore the Bactrim should be effective. 2 small areas on the right leg are healed the area on the mid aspect of the tibia almost 100% covered in a very adherent necrotic debris. We have been using silver alginate 02/20/2021 upon evaluation today patient appears to be doing well with  regard to his wound. He is showing signs of improvement which is great news overall very pleased with where things stand today. No fevers, chills, nausea, vomiting, or diarrhea. 02/27/2021 upon evaluation today patient appears to be doing well with regard to his leg ulcer. He is tolerating the dressing changes and overall appears to be doing quite excellent. I am extremely pleased with where things stand and overall I think patient is making great progress. There is no sign of active infection at this time which is also great news. 03/06/2021 upon evaluation today patient appears to be doing excellent in regard to his wounds. In fact he appears to be completely healed today based on what I am seeing. This is excellent news and overall I am extremely pleased with where he stands. Overall the patient is happy to hear YOGI, ARTHER ([id number]) this as well this has been quite sometime coming. Readmission: 04/01/2021 patient unfortunately returns for readmission today. He tells me that he has been wearing his compression socks on the right leg daily and he does not really know what is going on and why his legs are doing what they are doing. We did therefore go ahead and probe deeper into exactly what has been going on with him today. Subsequently the patient tells me that when  he gets up and what we would call "first thing in the morning" are really 2 different things". He does not tend to sleep well so he tells me that he will often wake up at 3:00 in the morning. He will then potentially going into the living room to get in his chair where he may read a book for a little while and then potentially fall asleep back in his chair. He then subsequently wake up around 5:00 or so and then get up and in his words "putter around". This often will end with him proceeding at some point around 8 AM or 9 AM to putting on his compression socks. With that being said this means that anywhere from the 3:00 in the morning till roughly around 9:00 in the morning he has no compression on yet he is sitting in his recliner, walking around and up and about, and this is at least 5 to 6 hours of noncompressed time. That may be our issue here but I did not realize until we discussed this further today. Fortunately there does not appear to be any signs of active infection at this time which is great news. With that being said he has multiple wounds of the bilateral lower extremities. 04/10/2021 upon evaluation today patient appears to be doing well with regard to his legs for the most part. He does look like he had some injury where his wrap slid down nonetheless he did some "doctoring on them". I do feel like most of the areas honestly have cleared back up which is great news. There does not appear to be any signs of active infection at this time which is also great news. No fevers, chills, nausea, vomiting, or diarrhea. 04/18/2021 upon evaluation today patient appears to be doing well with regard to his wounds in fact he appears to be completely healed which is great news. Fortunately there does not appear to be any signs of active infection which is great news. No fevers, chills, nausea, vomiting, or diarrhea. READMISSION 07/28/2021 This is a now 75 year old man we have had in this clinic for  quite a bit of this year. He has been in here with bilateral  lower extremity leg wounds probably secondary to chronic venous insufficiency. He wears compression stockings. 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 He comes in an old running shoes although he says he wears better shoes at home. His history is that he noticed a blister in the right posterior heel. This open. He has been applying Neosporin to it currently the wound measures 2 x 1.4 cm. 100% slough covered. Not really offloading this in any rigorous way. Past medical history is essentially unchanged he has paroxysmal atrial fibrillation on Coumadin type 2 diabetes with a recent hemoglobin A1c of 7 on metformin. ABI in our clinic was 0.98 on the right 08/05/2021 upon evaluation today patient appears to be doing well with regard to his heel ulcer. Fortunately there does not appear to be any signs of active infection at this time. Overall I been very pleased with where things seem to be currently as far as the wound healing is concerned. Again this is first a lot of seeing him Dr. Dellia Nims readmitted him last week. Nonetheless I think we are definitely making some progress here. 08/11/2021 upon evaluation today patient's wound on the heel actually showing signs of excellent improvement. I am actually very pleased with where things stand currently. No fevers, chills, nausea, vomiting, or diarrhea. There does not appear to be any need for sharp debridement today either which is also great news. 08/25/2021 upon evaluation today patient appears to be doing well with regard to his heel ulcer. This is actually looking significantly improved compared to last time I saw him. Fortunately there does not appear to be any evidence of active infection at this time. No fevers, chills, nausea, vomiting, or diarrhea. 09/01/2021 upon evaluation today patient appears to be doing decently well in regard to his wounds.  Fortunately there does not appear to be any signs of active infection at this time which is great news and overall very pleased in that regard. I do not see any evidence of active infection systemically which is great news as well. No fevers, chills, nausea, vomiting, or diarrhea. I think that the heel is making excellent progress. 09/15/2021 upon evaluation today patient's heel unfortunately is significantly worse compared to what it was previous. I do believe that at this time he would benefit from switching back to something a little bit more offloading from the cushion shoe he has right now this is more of a heel protector what I really need is a Prevalon offloading boot which I think is good to do a lot better for him than just the small heel protector. Objective Constitutional Well-nourished and well-hydrated in no acute distress. Vitals Time Taken: 3:37 PM, Height: 69 in, Weight: 267 lbs, BMI: 39.4, Temperature: 98.4 F, Pulse: 72 bpm, Respiratory Rate: 16 breaths/min, Blood Pressure: 130/61 mmHg. Respiratory RANALD, ALESSIO ([id number]) normal breathing without difficulty. Psychiatric this patient is able to make decisions and demonstrates good insight into disease process. Alert and Oriented x 3. pleasant and cooperative. General Notes: Upon inspection patient's wound did require significant debridement today to clear away some of the necrotic debris he also had a deep tissue injury centrally which is unfortunately not good. 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 We need to try to protect his heel. Integumentary (Hair, Skin) Wound #9 status is  Open. Original cause of wound was Gradually Appeared. The date acquired was: 07/07/2021. The wound has been in treatment 7 weeks. The wound is located on the Left Calcaneus. The wound measures 0.8cm  length x 0.5cm width x 0.1cm depth; 0.314cm^2 area and 0.031cm^3 volume. There is Fat Layer (Subcutaneous Tissue) exposed. There is no tunneling or undermining noted. There is a medium amount of serosanguineous drainage noted. The wound margin is thickened. There is medium (34-66%) red, pink granulation within the wound bed. There is a medium (34-66%) amount of necrotic tissue within the wound bed including Adherent Slough. 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 There was a Excisional Skin/Subcutaneous Tissue Debridement with a total area of 11.55 sq cm performed by Tommie Sams., PA-C. With the following instrument(s): Curette, Forceps, and Scissors to remove Viable and Non-Viable tissue/material. Material removed includes Callus, Subcutaneous Tissue, and Slough after achieving pain control using Lidocaine. A time out was conducted at 15:53, prior to the start of the procedure. A Minimum amount of bleeding was controlled with Pressure. The procedure was tolerated well. Post Debridement Measurements: 3.3cm length x 3.5cm width x 0.1cm depth; 0.907cm^3 volume. Character of Wound/Ulcer Post Debridement is improved. Severity of Tissue Post Debridement is: Fat layer exposed. Post procedure Diagnosis Wound #9: Same as Pre-Procedure Plan Follow-up Appointments: Return Appointment in 1 week. Nurse Visit as needed - call to schedule Bathing/ Shower/ Hygiene: May shower with wound dressing protected with water repellent cover or cast protector. No tub bath. Edema Control - Lymphedema / Segmental Compressive Device / Other: Patient to wear  own compression stockings. Remove compression stockings every night before going to bed and put on every morning when getting up. - right leg Elevate leg(s) parallel to the floor when sitting. DO YOUR BEST to sleep in the bed at night. DO NOT sleep in your recliner. Long hours of sitting in a recliner leads to swelling of the legs and/or potential wounds on your backside. Off-Loading: Other: - L heel offloader shoe Keep pressure off of heel in the bed-float heel in [place] order from [employer], see sheet Additional Orders / Instructions: Follow Nutritious Diet and Increase Protein Intake - check blood sugar and eat protein for wound healing WOUND #9: - Calcaneus Wound Laterality: Left Cleanser: Normal Saline Every Other Day/15 Days Discharge Instructions: Wash your hands with soap and water. Remove old dressing, discard into plastic bag and place into trash. Cleanse the wound with Normal Saline prior to applying a clean dressing using gauze sponges, not tissues or cotton balls. Do not scrub or use excessive force. Pat dry using gauze sponges, not tissue or cotton balls. Primary Dressing: Silvercel Small 2x2 (in/in) (DME) (Generic) Every Other Day/15 Days LIBAN, [name] ([id number]) Discharge Instructions: Apply Silvercel Small 2x2 (in/in) as instructed Secondary Dressing: ABD Pad 5x9 (in/in) (DME) (Generic) Every Other Day/15 Days Discharge Instructions: Cover with ABD pad Secondary Dressing: heel cup Every Other Day/15 Days Discharge Instructions: apply over dressing to protect heel Secured With: 41M Medipore H Soft Cloth Surgical Tape, 2x2 (in/yd) (Generic) Every Other Day/15 Days Secured With: Coban Cohesive Bandage 4x5 (yds) Stretched Every Other Day/15 Days Discharge Instructions: Apply Coban as directed.  Secured With: [employer] or Non-Sterile 6-ply 4.5x4 (yd/yd) (Generic) Every Other Day/15 Days Discharge Instructions: Apply Kerlix as directed Add-Ons: Prevalon  boot Every Other Day/15 Days Discharge Instructions: use while in bed 1. Would recommend currently that we going continue with the wound care measures as before and the patient is in agreement with the plan this includes the use of the silver alginate dressing which I think is still good. 2. I am also can recommend that we have the patient continue with the ABD pad to cover followed by heel cup and then roll gauze to secure in place. 3. I am also can recommend that he should get a Prevalon offloading boot he was given the information for this today and he is going to look that up on [employer] and get that ordered. We will see patient back for reevaluation in 1 week here in the clinic. If anything worsens or changes patient will contact our office for additional recommendations. Electronic Signature(s) Signed: 09/15/2021 4:39:08 PM By: Worthy Keeler PA-C Entered By: Worthy Keeler on 09/15/2021 16:39:08 [name], [name] ([id number]) -------------------------------------------------------------------------------- SuperBill Details Patient Name: TIM, [name]. Date of Service: 09/15/2021 Medical Record Number: [id number] Patient Account Number: [id number] Date of Birth/Sex: [birth year] (75 y.o. M) Treating RN: Donnamarie Poag Primary Care Provider: Fulton Reek Other Clinician: Referring Provider: Fulton Reek Treating Provider/Extender: Skipper Cliche in Treatment: 7 Diagnosis Coding ICD-10 Codes Code Description E11.621 Type 2 diabetes mellitus with foot ulcer L97.421 Non-pressure chronic ulcer of left heel and midfoot limited to breakdown of skin E11.42 Type 2 diabetes mellitus with diabetic polyneuropathy Facility Procedures CPT4 Code: 92010071 Description: 21975 - DEB SUBQ TISSUE 20 SQ CM/< Modifier: Quantity: 1 CPT4 Code: Description: ICD-10 Diagnosis Description L97.421 Non-pressure chronic ulcer of left heel and midfoot limited to breakdow Modifier: n of  skin Quantity: Physician Procedures CPT4 Code: 8832549 Description: 99214 - WC PHYS LEVEL 4 - EST PT Modifier: 25 Quantity: 1 CPT4 Code: Description: ICD-10 Diagnosis Description E11.621 Type 2 diabetes mellitus with foot ulcer L97.421 Non-pressure chronic ulcer of left heel and midfoot limited to breakdow E11.42 Type 2 diabetes mellitus with diabetic polyneuropathy Modifier: n of skin Quantity: CPT4 Code: [id number] Description: 11042 - WC PHYS SUBQ TISS 20 SQ CM Modifier: Quantity: 1 CPT4 Code: Description: ICD-10 Diagnosis Description L97.421 Non-pressure chronic ulcer of left heel and midfoot limited to breakdow Modifier: n of skin Quantity: Electronic Signature(s) Signed: 09/15/2021 4:39:20 PM By: Worthy Keeler PA-C Previous Signature: 09/15/2021 4:28:37 PM Version By: Donnamarie Poag Entered By: Worthy Keeler on 09/15/2021 16:39:20

## 2021-09-15 NOTE — Progress Notes (Signed)
[name], [name] ([id number]) Visit Report for 09/15/2021 Arrival Information Details Patient Name: [name], [name]. Date of Service: 09/15/2021 3:15 PM Medical Record Number: [id number] Patient Account Number: [id number] Date of Birth/Sex: [birth year] (75 y.o. M) Treating RN: Donnamarie Poag Primary Care Shaasia Odle: Fulton Reek Other Clinician: Referring Kelise Kuch: Fulton Reek Treating Maven Varelas/Extender: Skipper Cliche in Treatment: 7 Visit Information History Since Last Visit Added or deleted any medications: No Patient Arrived: [name] Had a fall or experienced change in No Arrival Time: 15:31 activities of daily living that may affect Accompanied By: self risk of falls: Transfer Assistance: None Hospitalized since last visit: No Patient Identification Verified: Yes Has Dressing in Place as Prescribed: Yes Secondary Verification Process Completed: Yes Pain Present Now: No Patient Has Alerts: Yes Patient Alerts: Patient on Blood Thinner Coumadin Diabetic aspirin 9m Electronic Signature(s) Signed: 09/15/2021 4:28:37 PM By: BDonnamarie PoagEntered By: BDonnamarie Poagon 09/15/2021 15:37:30 [name]([id number] -------------------------------------------------------------------------------- Clinic Level of Care Assessment Details Patient Name: [name] Date of Service: 09/15/2021 3:15 PM Medical Record Number: [id number] Account Number: [id number] of Birth/Sex: 927-May-1947(75y.o. M) Treating RN: BDonnamarie PoagPrimary Care Petr Bontempo: SFulton ReekOther Clinician: Referring Windy Dudek: SFulton ReekTreating Jamal Pavon/Extender: SSkipper Clichein Treatment: 7 Clinic Level of Care Assessment Items TOOL 1 Quantity Score [] - Use when EandM and Procedure is performed on INITIAL visit 0 ASSESSMENTS - Nursing Assessment / Reassessment [] - General Physical Exam (combine w/ comprehensive assessment (listed just below) when performed on new 0 pt.  evals) [] - 0 Comprehensive Assessment (HX, ROS, Risk Assessments, Wounds Hx, etc.) ASSESSMENTS - Wound and Skin Assessment / Reassessment [] - Dermatologic / Skin Assessment (not related to wound area) 0 ASSESSMENTS - Ostomy and/or Continence Assessment and Care [] - Incontinence Assessment and Management 0 [] - 0 Ostomy Care Assessment and Management (repouching, etc.) PROCESS - Coordination of Care [] - Simple Patient / Family Education for ongoing care 0 [] - 0 Complex (extensive) Patient / Family Education for ongoing care [] - 0 Staff obtains Consents, Records, Test Results / Process Orders [] - 0 Staff telephones HHA, Nursing Homes / Clarify orders / etc [] - 0 Routine Transfer to another Facility (non-emergent condition) [] - 0 Routine Hospital Admission (non-emergent condition) [] - 0 New Admissions / IBiomedical engineer/ Ordering NPWT, Apligraf, etc. [] - 0 Emergency Hospital Admission (emergent condition) PROCESS - Special Needs [] - Pediatric / Minor Patient Management 0 [] - 0 Isolation Patient Management [] - 0 Hearing / Language / Visual special needs [] - 0 Assessment of Community assistance (transportation, D/C planning, etc.) [] - 0 Additional assistance / Altered mentation [] - 0 Support Surface(s) Assessment (bed, cushion, seat, etc.) INTERVENTIONS - Miscellaneous [] - External ear exam 0 [] - 0 Patient Transfer (multiple staff / HCivil Service fast streamer/ Similar devices) [] - 0 Simple Staple / Suture removal (25 or less) [] - 0 Complex Staple / Suture removal (26 or more) [] - 0 Hypo/Hyperglycemic Management (do not check if billed separately) [] - 0 Ankle / Brachial Index (ABI) - do not check if billed separately Has the patient been seen at the hospital within the last three years: Yes Total Score: 0 Level Of Care: ____ [name]([id number] Electronic Signature(s) Signed: 09/15/2021 4:28:37 PM By: BDonnamarie PoagEntered By: BDonnamarie Poagon  09/15/2021 16:06:30 [name]([id number] -------------------------------------------------------------------------------- Encounter Discharge Information Details Patient Name: [name], [name] Date of  Service: 09/15/2021 3:15 PM Medical Record Number: [id number] Patient Account Number: [id number] Date of Birth/Sex: 08/31/1946 (75 y.o. M) Treating RN: Bishop, Joy Primary Care : Sparks, Jeffrey Other Clinician: Referring : Sparks, Jeffrey Treating /Extender: Stone, Hoyt Weeks in Treatment: 7 Encounter Discharge Information Items Post Procedure Vitals Discharge Condition: Stable Temperature ([name]): 98.4 Ambulatory Status: Cane Pulse (bpm): 72 Discharge Destination: Home Respiratory Rate (breaths/min): 16 Transportation: Private Auto Blood Pressure (mmHg): 130/61 Accompanied By: self Schedule Follow-up Appointment: Yes Clinical Summary of Care: Electronic Signature(s) Signed: 09/15/2021 4:28:37 PM By: Bishop, Joy Entered By: Bishop, Joy on 09/15/2021 16:20:04 [name], [name] ([id number]) -------------------------------------------------------------------------------- Lower Extremity Assessment Details Patient Name: [name], [name] J. Date of Service: 09/15/2021 3:15 PM Medical Record Number: [id number] Patient Account Number: [id number] Date of Birth/Sex: 03/31/1946 (75 y.o. M) Treating RN: Bishop, Joy Primary Care : Sparks, Jeffrey Other Clinician: Referring : Sparks, Jeffrey Treating /Extender: Stone, Hoyt Weeks in Treatment: 7 Edema Assessment Assessed: [Left: Yes] [Right: No] [Left: Edema] [Right: :] Vascular Assessment Pulses: Dorsalis Pedis Palpable: [Left:Yes] Electronic Signature(s) Signed: 09/15/2021 4:28:37 PM By: Bishop, Joy Entered By: Bishop, Joy on 09/15/2021 15:46:50 [name], [name] J. ([id number]) -------------------------------------------------------------------------------- Multi Wound Chart  Details Patient Name: [name], [name] J. Date of Service: 09/15/2021 3:15 PM Medical Record Number: [id number] Patient Account Number: [id number] Date of Birth/Sex: 04/23/1946 (75 y.o. M) Treating RN: Bishop, Joy Primary Care : Sparks, Jeffrey Other Clinician: Referring : Sparks, Jeffrey Treating /Extender: Stone, Hoyt Weeks in Treatment: 7 Vital Signs Height(in): 69 Pulse(bpm): 72 Weight(lbs): 267 Blood Pressure(mmHg): 130/61 Body Mass Index(BMI): 39 Temperature(°[name]): 98.4 Respiratory Rate(breaths/min): 16 Photos: [N/A:N/A] Wound Location: Left Calcaneus N/A N/A Wounding Event: Gradually Appeared N/A N/A Primary Etiology: Diabetic Wound/Ulcer of the Lower N/A N/A Extremity Comorbid History: Cataracts, Arrhythmia, Coronary N/A N/A Artery Disease, Hypertension, Type II Diabetes, Osteoarthritis, Neuropathy Date Acquired: 07/07/2021 N/A N/A Weeks of Treatment: 7 N/A N/A Wound Status: Open N/A N/A Measurements L x W x D (cm) 0.8x0.5x0.1 N/A N/A Area (cm) : 0.314 N/A N/A Volume (cm) : 0.031 N/A N/A % Reduction in Area: 85.70% N/A N/A % Reduction in Volume: 85.90% N/A N/A Classification: Grade 2 N/A N/A Exudate Amount: Medium N/A N/A Exudate Type: Serosanguineous N/A N/A Exudate Color: red, brown N/A N/A Wound Margin: Thickened N/A N/A Granulation Amount: Medium (34-66%) N/A N/A Granulation Quality: Red, Pink N/A N/A Necrotic Amount: Medium (34-66%) N/A N/A Exposed Structures: Fat Layer (Subcutaneous Tissue): N/A N/A Yes Fascia: No Tendon: No Muscle: No Joint: No Bone: No Treatment Notes Electronic Signature(s) Signed: 09/15/2021 4:28:37 PM By: Bishop, Joy Entered By: Bishop, Joy on 09/15/2021 15:52:30 [name], [name] J. ([id number]) -------------------------------------------------------------------------------- Multi-Disciplinary Care Plan Details Patient Name: [name], [name] J. Date of Service: 09/15/2021 3:15 PM Medical Record Number:  [id number] Patient Account Number: [id number] Date of Birth/Sex: 03/24/1946 (75 y.o. M) Treating RN: Bishop, Joy Primary Care : Sparks, Jeffrey Other Clinician: Referring : Sparks, Jeffrey Treating /Extender: Stone, Hoyt Weeks in Treatment: 7 Active Inactive Wound/Skin Impairment Nursing Diagnoses: Impaired tissue integrity Knowledge deficit related to smoking impact on wound healing Knowledge deficit related to ulceration/compromised skin integrity Goals: Patient/caregiver will verbalize understanding of skin care regimen Date Initiated: 07/28/2021 Date Inactivated: 08/11/2021 Target Resolution Date: 08/08/2021 Goal Status: Met Ulcer/skin breakdown will have a volume reduction of 30% by week 4 Date Initiated: 07/28/2021 Target Resolution Date: 08/28/2021 Goal Status: Active Ulcer/skin breakdown will have a volume reduction of 50% by week 8 Date Initiated: 07/28/2021 Target Resolution Date: 09/27/2021 Goal Status: Active Ulcer/skin breakdown will have a   volume reduction of 80% by week 12 Date Initiated: 07/28/2021 Target Resolution Date: 10/28/2020 Goal Status: Active Ulcer/skin breakdown will heal within 14 weeks Date Initiated: 07/28/2021 Target Resolution Date: 11/12/2020 Goal Status: Active Interventions: Assess patient/caregiver ability to obtain necessary supplies Assess patient/caregiver ability to perform ulcer/skin care regimen upon admission and as needed Assess ulceration(s) every visit Notes: Electronic Signature(s) Signed: 09/15/2021 4:28:37 PM By: Donnamarie Poag Entered By: Donnamarie Poag on 09/15/2021 15:47:01 [name] ([id number]) -------------------------------------------------------------------------------- Pain Assessment Details Patient Name: [name]. Date of Service: 09/15/2021 3:15 PM Medical Record Number: [id number] Patient Account Number: [id number] Date of Birth/Sex: Apr 19, 1946 (75 y.o. M) Treating RN: Donnamarie Poag Primary Care Katrece Roediger: Fulton Reek Other Clinician: Referring Arlene Brickel: Fulton Reek Treating [name] Mathers/Extender: Skipper Cliche in Treatment: 7 Active Problems Location of Pain Severity and Description of Pain Patient Has Paino No Site Locations Rate the pain. Current Pain Level: 0 Pain Management and Medication Current Pain Management: Electronic Signature(s) Signed: 09/15/2021 4:28:37 PM By: Donnamarie Poag Entered By: Donnamarie Poag on 09/15/2021 15:39:32 [name] ([id number]) -------------------------------------------------------------------------------- Patient/Caregiver Education Details Patient Name: [name] Date of Service: 09/15/2021 3:15 PM Medical Record Number: [id number] Patient Account Number: [id number] Date of Birth/Gender: 09-22-1946 (75 y.o. M) Treating RN: Donnamarie Poag Primary Care Physician: Fulton Reek Other Clinician: Referring Physician: Fulton Reek Treating Physician/Extender: Skipper Cliche in Treatment: 7 Education Assessment Education Provided To: Patient Education Topics Provided Basic Hygiene: Elevated Blood Sugar/ Impact on Healing: Pressure: Wound Debridement: Wound/Skin Impairment: Electronic Signature(s) Signed: 09/15/2021 4:28:37 PM By: Donnamarie Poag Entered By: Donnamarie Poag on 09/15/2021 16:06:56 [name] ([id number]) -------------------------------------------------------------------------------- Wound Assessment Details Patient Name: [name], BAZINET. Date of Service: 09/15/2021 3:15 PM Medical Record Number: [id number] Patient Account Number: [id number] Date of Birth/Sex: November 12, 1945 (74 y.o. M) Treating RN: Donnamarie Poag Primary Care Yvonne Stopher: Fulton Reek Other Clinician: Referring Dannielle Baskins: Fulton Reek Treating Jerita Wimbush/Extender: Skipper Cliche in Treatment: 7 Wound Status Wound Number: 9 Primary Diabetic Wound/Ulcer of the Lower Extremity Etiology: Wound Location: Left  Calcaneus Wound Open Wounding Event: Gradually Appeared Status: Date Acquired: 07/07/2021 Comorbid Cataracts, Arrhythmia, Coronary Artery Disease, Weeks Of Treatment: 7 History: Hypertension, Type II Diabetes, Osteoarthritis, Clustered Wound: No Neuropathy Photos Wound Measurements Length: (cm) 0.8 Width: (cm) 0.5 Depth: (cm) 0.1 Area: (cm) 0.314 Volume: (cm) 0.031 % Reduction in Area: 85.7% % Reduction in Volume: 85.9% Tunneling: No Undermining: No Wound Description Classification: Grade 2 Wound Margin: Thickened Exudate Amount: Medium Exudate Type: Serosanguineous Exudate Color: red, brown Foul Odor After Cleansing: No Slough/Fibrino Yes Wound Bed Granulation Amount: Medium (34-66%) Exposed Structure Granulation Quality: Red, Pink Fascia Exposed: No Necrotic Amount: Medium (34-66%) Fat Layer (Subcutaneous Tissue) Exposed: Yes Necrotic Quality: Adherent Slough Tendon Exposed: No Muscle Exposed: No Joint Exposed: No Bone Exposed: No Treatment Notes Wound #9 (Calcaneus) Wound Laterality: Left Cleanser Normal Saline Discharge Instruction: Wash your hands with soap and water. Remove old dressing, discard into plastic bag and place into trash. Cleanse the wound with Normal Saline prior to applying a clean dressing using gauze sponges, not tissues or cotton balls. Do not [name], [name] ([id number]) scrub or use excessive force. Pat dry using gauze sponges, not tissue or cotton balls. Peri-Wound Care Topical Primary Dressing Silvercel Small 2x2 (in/in) Discharge Instruction: Apply Silvercel Small 2x2 (in/in) as instructed Secondary Dressing ABD Pad 5x9 (in/in) Discharge Instruction: Cover with ABD pad heel cup Discharge Instruction: apply over dressing to protect heel Secured With 67M Medipore H Soft Cloth Surgical Tape, 2x2 (  in/yd) Coban Cohesive Bandage 4x5 (yds) Stretched Discharge Instruction: Apply Coban as directed. Kerlix Roll Sterile or Non-Sterile  6-ply 4.5x4 (yd/yd) Discharge Instruction: Apply Kerlix as directed Compression Wrap Compression Stockings Add-Ons Prevalon boot Discharge Instruction: use while in bed Electronic Signature(s) Signed: 09/15/2021 4:28:37 PM By: Donnamarie Poag Entered By: Donnamarie Poag on 09/15/2021 16:21:46 [name] ([id number]) -------------------------------------------------------------------------------- [place] Details Patient Name: [name], MATO. Date of Service: 09/15/2021 3:15 PM Medical Record Number: [id number] Patient Account Number: [id number] Date of Birth/Sex: 1946-07-09 (75 y.o. M) Treating RN: Donnamarie Poag Primary Care Lynnleigh Soden: Fulton Reek Other Clinician: Referring Holleigh Crihfield: Fulton Reek Treating Reilynn Lauro/Extender: Skipper Cliche in Treatment: 7 Vital Signs Time Taken: 15:37 Temperature ([name]): 98.4 Height (in): 69 Pulse (bpm): 72 Weight (lbs): 267 Respiratory Rate (breaths/min): 16 Body Mass Index (BMI): 39.4 Blood Pressure (mmHg): 130/61 Reference Range: 80 - 120 mg / dl Electronic Signature(s) Signed: 09/15/2021 4:28:37 PM By: Donnamarie Poag Entered ByDonnamarie Poag on 09/15/2021 15:38:25

## 2021-09-15 NOTE — Progress Notes (Signed)
[facility]  [phone] [phone]  ID: [name] OB: [birth year]  MR#: [id number]  YWV#:[id number]  Patient Care Team: Idelle Crouch, MD as PCP - General (Internal Medicine)  CHIEF COMPLAINT: Anemia, unspecified.  INTERVAL HISTORY: Patient initially evaluated as an inpatient consult and returns to clinic today for further evaluation and discussion of his bone marrow biopsy results.  He feels significantly improved since admission and is nearly back to his baseline. He has no neurologic complaints.  He denies any recent fevers or illnesses.  He has a fair appetite, but denies weight loss.  He has no chest pain, shortness of breath, cough, or hemoptysis.  He denies any nausea, vomiting, constipation, or diarrhea. He has no urinary complaints.  Patient offers no specific complaints today.  REVIEW OF SYSTEMS:   Review of Systems  Constitutional: Negative.  Negative for fever, malaise/fatigue and weight loss.  Respiratory: Negative.  Negative for cough, hemoptysis and shortness of breath.   Cardiovascular: Negative.  Negative for chest pain and leg swelling.  Gastrointestinal: Negative.  Negative for abdominal pain.  Genitourinary: Negative.  Negative for hematuria.  Musculoskeletal: Negative.  Negative for back pain.  Neurological: Negative.  Negative for dizziness, focal weakness, weakness and headaches.  Psychiatric/Behavioral: Negative.  The patient is not nervous/anxious.    As per HPI. Otherwise, a complete review of systems is negative.  PAST MEDICAL HISTORY: Past Medical History:  Diagnosis Date   Anemia    CHF (congestive heart failure) (HCC)    Chronic pain    Coronary artery disease    DDD (degenerative disc disease), hip    Diabetes mellitus without complication (HCC)    DJD (degenerative joint disease)    Dysrhythmia    GERD (gastroesophageal reflux disease)    History of hip replacement    Hyperlipemia    Hypertension     Insomnia    Neuropathy    Paroxysmal A-fib ([place])    Peripheral neuropathy    Stroke [facility])    Thoracic aortic atherosclerosis ([place])     PAST SURGICAL HISTORY: Past Surgical History:  Procedure Laterality Date   BACK SURGERY     CARDIAC SURGERY     CARDIAC VALVE REPLACEMENT     CARDIOVERSION N/A 05/04/2017   Procedure: CARDIOVERSION;  Surgeon: Corey Skains, MD;  Location: [facility] ORS;  Service: Cardiovascular;  Laterality: N/A;   CARDIOVERSION N/A 09/04/2021   Procedure: CARDIOVERSION;  Surgeon: Corey Skains, MD;  Location: [facility] ORS;  Service: Cardiovascular;  Laterality: N/A;   carpel tunnel release     CATARACT EXTRACTION W/ INTRAOCULAR LENS IMPLANT Bilateral    COLONOSCOPY     COLONOSCOPY WITH ESOPHAGOGASTRODUODENOSCOPY (EGD)     COLONOSCOPY WITH PROPOFOL N/A 05/16/2018   Procedure: COLONOSCOPY WITH PROPOFOL;  Surgeon: Manya Silvas, MD;  Location: [facility] ENDOSCOPY;  Service: Endoscopy;  Laterality: N/A;   COLONOSCOPY WITH PROPOFOL N/A 08/06/2021   Procedure: COLONOSCOPY WITH PROPOFOL;  Surgeon: Toledo, Benay Pike, MD;  Location: [facility] ENDOSCOPY;  Service: Gastroenterology;  Laterality: N/A;   ESOPHAGOGASTRODUODENOSCOPY (EGD) WITH PROPOFOL N/A 05/16/2018   Procedure: ESOPHAGOGASTRODUODENOSCOPY (EGD) WITH PROPOFOL;  Surgeon: Manya Silvas, MD;  Location: [facility] ENDOSCOPY;  Service: Endoscopy;  Laterality: N/A;   JOINT REPLACEMENT     TEE WITHOUT CARDIOVERSION N/A 10/08/2016   Procedure: TRANSESOPHAGEAL ECHOCARDIOGRAM (TEE);  Surgeon: Teodoro Spray, MD;  Location: [facility] ORS;  Service: Cardiovascular;  Laterality: N/A;   TEE WITHOUT CARDIOVERSION N/A 11/23/2016   Procedure: TRANSESOPHAGEAL ECHOCARDIOGRAM (  TEE);  Surgeon: Isaias Cowman, MD;  Location: [facility] ORS;  Service: Cardiovascular;  Laterality: N/A;   TONSILLECTOMY     VSD REPAIR      FAMILY HISTORY: Family History  Problem Relation Age of Onset   Stroke Mother    Heart attack Mother    Heart attack Father      ADVANCED DIRECTIVES (Y/N):  N  HEALTH MAINTENANCE: Social History   Tobacco Use   Smoking status: Former    Types: Cigarettes    Quit date: 07/09/1996    Years since quitting: 25.2   Smokeless tobacco: Never  Vaping Use   Vaping Use: Never used  Substance Use Topics   Alcohol use: Not Currently    Comment: occasional   Drug use: No     Colonoscopy:  PAP:  Bone density:  Lipid panel:  Allergies  Allergen Reactions   Tetracycline Hives   Tetracyclines & Related Hives    Current Outpatient Medications  Medication Sig Dispense Refill   amiodarone (PACERONE) 200 MG tablet Take 1 tablet (200 mg total) by mouth 2 (two) times daily. 60 tablet 0   aspirin EC 81 MG tablet Take 81 mg by mouth daily.     cyanocobalamin (,VITAMIN B-12,) 1000 MCG/ML injection Inject 1,000 mcg into the muscle every 30 (thirty) days.     DULoxetine (CYMBALTA) 30 MG capsule Take 30 mg by mouth every evening.     ferrous sulfate 325 (65 FE) MG tablet Take 1 tablet (325 mg total) by mouth 2 (two) times daily with a meal. 60 tablet 3   gabapentin (NEURONTIN) 600 MG tablet Take 600 mg by mouth 2 (two) times daily.     glipiZIDE (GLUCOTROL XL) 5 MG 24 hr tablet Take 5 mg by mouth daily.     KLOR-CON M20 20 MEQ tablet Take by mouth.     loratadine (CLARITIN) 10 MG tablet Take 10 mg by mouth daily.     metFORMIN (GLUCOPHAGE) 1000 MG tablet Take 1,000 mg by mouth 2 (two) times daily. With meals.     methadone (DOLOPHINE) 5 MG tablet Take 5 mg by mouth 2 (two) times daily.      methimazole (TAPAZOLE) 10 MG tablet Take 20 mg by mouth daily.     metoprolol tartrate (LOPRESSOR) 25 MG tablet Take 25 mg by mouth 2 (two) times daily.      Multiple Vitamin (MULTI-VITAMINS) TABS Take 1 tablet by mouth daily.     pantoprazole (PROTONIX) 40 MG tablet Take 40 mg by mouth daily.     pravastatin (PRAVACHOL) 40 MG tablet Take 40 mg by mouth every evening.      torsemide (DEMADEX) 20 MG tablet Take by mouth daily.      warfarin (COUMADIN) 2.5 MG tablet Take 2.5 mg by mouth. Take on Monday and Friday only     warfarin (COUMADIN) 5 MG tablet Take 5 mg by mouth daily. Take on Tuesday, Wednesday, Thursday, Saturday, and Sunday     cyclobenzaprine (FLEXERIL) 10 MG tablet Take 10 mg by mouth daily as needed for muscle spasms.  (Patient not taking: Reported on 09/17/2021)     No current facility-administered medications for this visit.    OBJECTIVE: Vitals:   09/17/21 1018  BP: (!) 136/59  Pulse: 65  Resp: 18  SpO2: 97%     Body mass index is 38.45 kg/m.    ECOG FS:0 - Asymptomatic  General: Well-developed, well-nourished, no acute distress. Eyes: Pink conjunctiva, anicteric sclera. HEENT: Normocephalic,  moist mucous membranes. Lungs: No audible wheezing or coughing. Heart: Regular rate and rhythm. Abdomen: Soft, nontender, no obvious distention. Musculoskeletal: No edema, cyanosis, or clubbing. Neuro: Alert, answering all questions appropriately. Cranial nerves grossly intact. Skin: No rashes or petechiae noted. Psych: Normal affect.  LAB RESULTS:  Lab Results  Component Value Date   NA 137 08/21/2021   K 3.5 08/21/2021   CL 104 08/21/2021   CO2 27 08/21/2021   GLUCOSE 153 (H) 08/21/2021   BUN 19 08/21/2021   CREATININE 0.85 08/21/2021   CALCIUM 7.8 (L) 08/21/2021   PROT 6.5 08/18/2021   ALBUMIN 2.9 (L) 08/18/2021   AST 18 08/18/2021   ALT 15 08/18/2021   ALKPHOS 71 08/18/2021   BILITOT 0.3 08/18/2021   GFRNONAA >60 08/21/2021   GFRAA >60 12/28/2018    Lab Results  Component Value Date   WBC 8.3 09/08/2021   NEUTROABS 5.7 09/08/2021   HGB 10.3 (L) 09/08/2021   HCT 33.9 (L) 09/08/2021   MCV 94.4 09/08/2021   PLT 297 09/08/2021     STUDIES: CT ABDOMEN PELVIS WO CONTRAST  Result Date: 08/19/2021 CLINICAL DATA:  HGB dropped without any obvious bleeding, requiring blood transfusion, want to rule out any retroperitoneal bleed. EXAM: CT ABDOMEN AND PELVIS WITHOUT CONTRAST  TECHNIQUE: Multidetector CT imaging of the abdomen and pelvis was performed following the standard protocol without IV contrast. COMPARISON:  Contrast enhanced CT 12/28/2018 FINDINGS: Lower chest: Prosthetic aortic valve. Decreased density of the blood pool consistent with anemia. There is mild elevation of left hemidiaphragm. No acute airspace disease or pleural effusion. Bilateral gynecomastia. Hepatobiliary: Mild hepatic steatosis without focal hepatic lesion on this unenhanced exam. Gallbladder partially distended, no calcified stone. No biliary dilatation. Pancreas: Mild fatty atrophy.  No ductal dilatation or inflammation. Spleen: Normal in size without focal abnormality. Adrenals/Urinary Tract: Normal adrenal glands. No hydronephrosis or renal calculi. There is faint bilateral perinephric edema that is chronic and similar to prior exam. The previous cyst in the lower left kidney is not well delineated on the current exam. Physiologically distended urinary bladder. No bladder wall thickening. Stomach/Bowel: Small hiatal hernia. Stomach is decompressed. There is a small duodenal diverticulum. Occasional fecalization of distal small bowel contents. No small bowel inflammation or obstruction. Normal appendix. Moderate volume of stool throughout the colon. There is no colonic wall thickening or pericolonic edema. Vascular/Lymphatic: Moderate aortic atherosclerosis. No aortic aneurysm. There is no retroperitoneal or portal venous gas. There is no enlargement in the trip air today and suggest retroperitoneal hemorrhage. There is no enlargement of the abdominal wall musculature to suggest rectus sheath hematoma. No hemoperitoneum or free fluid. No enlarged lymph nodes in the abdomen or pelvis. Reproductive: Grossly unremarkable prostate gland, partially obscured by streak artifact from right hip arthroplasty. Other: No free fluid or free air. Tiny fat containing umbilical hernia. There is fat within the left inguinal  canal. No evidence of abdominopelvic hemorrhage or hematoma. No body wall abnormality. Musculoskeletal: Right hip arthroplasty. Posterior lumbar fusion L4 through S1. 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 No acute osseous abnormalities are seen. Remote left eleventh rib fracture. IMPRESSION: 1. No evidence of abdominopelvic hemorrhage or acute abnormality. No retroperitoneal or rectus sheath hematoma. 2. Moderate colonic stool burden with fecalization of distal small bowel contents, suggesting slow transit. No bowel obstruction or inflammation. 3. Mild hepatic steatosis. Aortic Atherosclerosis (ICD10-I70.0). Electronically Signed   By: Keith Rake M.D.   On: 08/19/2021 17:09   CT BONE  MARROW BIOPSY & ASPIRATION  Result Date: 09/08/2021 INDICATION: Anemia EXAM: CT BONE MARROW BIOPSY AND ASPIRATION MEDICATIONS: None. ANESTHESIA/SEDATION: Moderate (conscious) sedation was employed during this procedure. A total of Versed 1 mg and Fentanyl 50 mcg was administered intravenously. Moderate Sedation Time: 12 minutes. The patient's level of consciousness and vital signs were monitored continuously by radiology nursing throughout the procedure under my direct supervision. FLUOROSCOPY TIME:  N/A COMPLICATIONS: None immediate. PROCEDURE: Informed written consent was obtained from the patient after a thorough discussion of the procedural risks, benefits and alternatives. All questions were addressed. Maximal Sterile Barrier Technique was utilized including caps, mask, sterile gowns, sterile gloves, sterile drape, hand hygiene and skin antiseptic. A timeout was performed prior to the initiation of the procedure. The patient was placed prone on the CT exam table. Limited CT of the pelvis was performed for planning purposes. Skin entry site was marked, and the overlying skin was prepped and draped in the standard sterile fashion. Local analgesia was obtained with 1% lidocaine. Using CT  guidance, an 11 gauge needle was advanced just deep to the cortex of the right posterior ilium. Subsequently, bone marrow aspiration and core biopsy were performed. Specimens were submitted to lab/pathology for handling. Hemostasis was achieved with manual pressure, and a clean dressing was placed. The patient tolerated the procedure well without immediate complication. IMPRESSION: Successful CT-guided bone marrow aspiration and core biopsy of the right posterior ilium. Electronically Signed   By: Albin Felling M.D.   On: 09/08/2021 13:13    ASSESSMENT: Anemia, unspecified.  PLAN:    Anemia, unspecified: Unclear etiology.  Iron stores, B12, and folate are all within normal limits.  There is no evidence of hemolysis.  Pathology review of blood smear was unrevealing.  Colonoscopy on August 06, 2021 did not reveal any significant pathology.  Bone marrow biopsy on September 08, 2021 was hypercellular for age, but otherwise essentially normal.  Patient's most recent hemoglobin was improved to 10.3.  No intervention is needed at this time.  He is having laboratory work done at his primary care physician later this week.  Return to clinic in 4 months with repeat laboratory work and further evaluation.  If his hemoglobin remained stable at that point, he possibly could be discharged from clinic.     Patient expressed understanding and was in agreement with this plan. He also understands that He can call clinic at any time with any questions, concerns, or complaints.    Lloyd Huger, MD   09/17/2021 12:18 PM

## 2021-09-17 ENCOUNTER — Encounter (HOSPITAL_COMMUNITY): Payer: Self-pay | Admitting: Oncology

## 2021-09-17 ENCOUNTER — Inpatient Hospital Stay: Payer: Medicare HMO | Attending: Oncology | Admitting: Oncology

## 2021-09-17 ENCOUNTER — Other Ambulatory Visit: Payer: Self-pay

## 2021-09-17 VITALS — BP 136/59 | HR 65 | Resp 18 | Wt 268.0 lb

## 2021-09-17 DIAGNOSIS — Z7901 Long term (current) use of anticoagulants: Secondary | ICD-10-CM | POA: Insufficient documentation

## 2021-09-17 DIAGNOSIS — Z7984 Long term (current) use of oral hypoglycemic drugs: Secondary | ICD-10-CM | POA: Insufficient documentation

## 2021-09-17 DIAGNOSIS — I509 Heart failure, unspecified: Secondary | ICD-10-CM | POA: Insufficient documentation

## 2021-09-17 DIAGNOSIS — Z79899 Other long term (current) drug therapy: Secondary | ICD-10-CM | POA: Insufficient documentation

## 2021-09-17 DIAGNOSIS — E785 Hyperlipidemia, unspecified: Secondary | ICD-10-CM | POA: Diagnosis not present

## 2021-09-17 DIAGNOSIS — E119 Type 2 diabetes mellitus without complications: Secondary | ICD-10-CM | POA: Insufficient documentation

## 2021-09-17 DIAGNOSIS — I480 Paroxysmal atrial fibrillation: Secondary | ICD-10-CM | POA: Diagnosis not present

## 2021-09-17 DIAGNOSIS — Z7982 Long term (current) use of aspirin: Secondary | ICD-10-CM | POA: Insufficient documentation

## 2021-09-17 DIAGNOSIS — D649 Anemia, unspecified: Secondary | ICD-10-CM | POA: Insufficient documentation

## 2021-09-17 DIAGNOSIS — Z87891 Personal history of nicotine dependence: Secondary | ICD-10-CM | POA: Insufficient documentation

## 2021-09-17 DIAGNOSIS — I110 Hypertensive heart disease with heart failure: Secondary | ICD-10-CM | POA: Insufficient documentation

## 2021-09-17 NOTE — Progress Notes (Signed)
Per Dr. Grayland Ormond, [facility] clinic called to request CBC added on to blood work being drawn on Friday.

## 2021-09-17 NOTE — Progress Notes (Signed)
Patient reports SOB and neuropathy. He was constipated but feels that has resolved.

## 2021-09-22 LAB — SURGICAL PATHOLOGY

## 2021-09-23 ENCOUNTER — Encounter: Payer: Medicare HMO | Attending: Physician Assistant | Admitting: Physician Assistant

## 2021-09-23 ENCOUNTER — Other Ambulatory Visit: Payer: Self-pay

## 2021-09-23 DIAGNOSIS — Z79891 Long term (current) use of opiate analgesic: Secondary | ICD-10-CM | POA: Insufficient documentation

## 2021-09-23 DIAGNOSIS — E1142 Type 2 diabetes mellitus with diabetic polyneuropathy: Secondary | ICD-10-CM | POA: Insufficient documentation

## 2021-09-23 DIAGNOSIS — Z952 Presence of prosthetic heart valve: Secondary | ICD-10-CM | POA: Insufficient documentation

## 2021-09-23 DIAGNOSIS — I480 Paroxysmal atrial fibrillation: Secondary | ICD-10-CM | POA: Insufficient documentation

## 2021-09-23 DIAGNOSIS — L97421 Non-pressure chronic ulcer of left heel and midfoot limited to breakdown of skin: Secondary | ICD-10-CM | POA: Insufficient documentation

## 2021-09-23 DIAGNOSIS — E11621 Type 2 diabetes mellitus with foot ulcer: Secondary | ICD-10-CM | POA: Insufficient documentation

## 2021-09-23 DIAGNOSIS — Z79899 Other long term (current) drug therapy: Secondary | ICD-10-CM | POA: Insufficient documentation

## 2021-09-23 DIAGNOSIS — G8929 Other chronic pain: Secondary | ICD-10-CM | POA: Insufficient documentation

## 2021-09-23 DIAGNOSIS — Z7901 Long term (current) use of anticoagulants: Secondary | ICD-10-CM | POA: Insufficient documentation

## 2021-09-23 NOTE — Progress Notes (Signed)
TYREK, LAWHORN ([id number]) Visit Report for 09/23/2021 Arrival Information Details Patient Name: [name], [name]. Date of Service: 09/23/2021 10:30 AM Medical Record Number: [id number] Patient Account Number: [id number] Date of Birth/Sex: [birth year] (75 y.o. M) Treating RN: [name] Primary Care Sarenity Ramaker: Fulton Reek Other Clinician: Levora Dredge Referring Dala Breault: Fulton Reek Treating Darrnell Mangiaracina/Extender: Skipper Cliche in Treatment: 8 Visit Information History Since Last Visit Added or deleted any medications: No Patient Arrived: [name] Any new allergies or adverse reactions: No Arrival Time: 10:50 Had a fall or experienced change in No Accompanied By: self activities of daily living that may affect Transfer Assistance: None risk of falls: Patient Identification Verified: Yes Hospitalized since last visit: No Secondary Verification Process Completed: Yes Has Dressing in Place as Prescribed: Yes Patient Has Alerts: Yes Has Footwear/Offloading in Place as Prescribed: Yes Patient Alerts: Patient on Blood Thinner Left: Surgical Shoe with Pressure Relief Coumadin Insole Diabetic Pain Present Now: No aspirin 66m Electronic Signature(s) Signed: 09/23/2021 11:46:59 AM By: GLevora DredgeEntered By: GLevora Dredgeon 09/23/2021 11:46:58 FEloise Levels([id number] -------------------------------------------------------------------------------- Clinic Level of Care Assessment Details Patient Name: FEloise Levels Date of Service: 09/23/2021 10:30 AM Medical Record Number: [id number] Account Number: [id number] of Birth/Sex: 911-May-1947(75y.o. M) Treating RN: GLevora DredgePrimary Care Kalyan Barabas: SFulton ReekOther Clinician: GLevora DredgeReferring Dimetrius Montfort: SFulton ReekTreating Liane Tribbey/Extender: SSkipper Clichein Treatment: 8 Clinic Level of Care Assessment Items TOOL 4 Quantity Score X - Use when only an EandM is performed  on FOLLOW-UP visit 1 0 ASSESSMENTS - Nursing Assessment / Reassessment '[]'  - Reassessment of Co-morbidities (includes updates in patient status) 0 '[]'  - 0 Reassessment of Adherence to Treatment Plan ASSESSMENTS - Wound and Skin Assessment / Reassessment X - Simple Wound Assessment / Reassessment - one wound 1 5 '[]'  - 0 Complex Wound Assessment / Reassessment - multiple wounds '[]'  - 0 Dermatologic / Skin Assessment (not related to wound area) ASSESSMENTS - Focused Assessment '[]'  - Circumferential Edema Measurements - multi extremities 0 '[]'  - 0 Nutritional Assessment / Counseling / Intervention '[]'  - 0 Lower Extremity Assessment (monofilament, tuning fork, pulses) '[]'  - 0 Peripheral Arterial Disease Assessment (using hand held doppler) ASSESSMENTS - Ostomy and/or Continence Assessment and Care '[]'  - Incontinence Assessment and Management 0 '[]'  - 0 Ostomy Care Assessment and Management (repouching, etc.) PROCESS - Coordination of Care X - Simple Patient / Family Education for ongoing care 1 15 '[]'  - 0 Complex (extensive) Patient / Family Education for ongoing care '[]'  - 0 Staff obtains CProgrammer, systems Records, Test Results / Process Orders '[]'  - 0 Staff telephones HHA, Nursing Homes / Clarify orders / etc '[]'  - 0 Routine Transfer to another Facility (non-emergent condition) '[]'  - 0 Routine Hospital Admission (non-emergent condition) '[]'  - 0 New Admissions / IBiomedical engineer/ Ordering NPWT, Apligraf, etc. '[]'  - 0 Emergency Hospital Admission (emergent condition) '[]'  - 0 Simple Discharge Coordination '[]'  - 0 Complex (extensive) Discharge Coordination PROCESS - Special Needs '[]'  - Pediatric / Minor Patient Management 0 '[]'  - 0 Isolation Patient Management '[]'  - 0 Hearing / Language / Visual special needs '[]'  - 0 Assessment of Community assistance (transportation, D/C planning, etc.) '[]'  - 0 Additional assistance / Altered mentation '[]'  - 0 Support Surface(s) Assessment (bed, cushion, seat,  etc.) INTERVENTIONS - Wound Cleansing / Measurement FAXZEL, [place] ([id number] X- 1 5 Simple Wound Cleansing - one wound '[]'  - 0 Complex Wound Cleansing - multiple wounds X- 1 5 Wound  Imaging (photographs - any number of wounds) '[]'  - 0 Wound Tracing (instead of photographs) X- 1 5 Simple Wound Measurement - one wound '[]'  - 0 Complex Wound Measurement - multiple wounds INTERVENTIONS - Wound Dressings X - Small Wound Dressing one or multiple wounds 1 10 '[]'  - 0 Medium Wound Dressing one or multiple wounds '[]'  - 0 Large Wound Dressing one or multiple wounds '[]'  - 0 Application of Medications - topical '[]'  - 0 Application of Medications - injection INTERVENTIONS - Miscellaneous '[]'  - External ear exam 0 '[]'  - 0 Specimen Collection (cultures, biopsies, blood, body fluids, etc.) '[]'  - 0 Specimen(s) / Culture(s) sent or taken to Lab for analysis '[]'  - 0 Patient Transfer (multiple staff / Civil Service fast streamer / Similar devices) '[]'  - 0 Simple Staple / Suture removal (25 or less) '[]'  - 0 Complex Staple / Suture removal (26 or more) '[]'  - 0 Hypo / Hyperglycemic Management (close monitor of Blood Glucose) '[]'  - 0 Ankle / Brachial Index (ABI) - do not check if billed separately '[]'  - 0 Vital Signs Has the patient been seen at the hospital within the last three years: Yes Total Score: 45 Level Of Care: New/Established - Level 2 Electronic Signature(s) Signed: 09/23/2021 11:58:41 AM By: Levora Dredge Entered By: Levora Dredge on 09/23/2021 11:55:35 [name] ([id number]) -------------------------------------------------------------------------------- Encounter Discharge Information Details Patient Name: DAXX, TIGGS. Date of Service: 09/23/2021 10:30 AM Medical Record Number: [id number] Patient Account Number: [id number] Date of Birth/Sex: 06-29-1946 (75 y.o. M) Treating RN: Levora Dredge Primary Care Donnie Gedeon: Fulton Reek Other Clinician: Levora Dredge Referring  Katelind Pytel: Fulton Reek Treating Janyah Singleterry/Extender: Skipper Cliche in Treatment: 8 Encounter Discharge Information Items Discharge Condition: Stable Ambulatory Status: Cane Discharge Destination: Home Transportation: Private Auto Accompanied By: self Schedule Follow-up Appointment: Yes Clinical Summary of Care: Electronic Signature(s) Signed: 09/23/2021 11:57:14 AM By: Levora Dredge Entered By: Levora Dredge on 09/23/2021 11:57:14 [name] ([id number]) -------------------------------------------------------------------------------- Lower Extremity Assessment Details Patient Name: MUHAMAD, SERANO. Date of Service: 09/23/2021 10:30 AM Medical Record Number: [id number] Patient Account Number: [id number] Date of Birth/Sex: [birth year] (75 y.o. M) Treating RN: Levora Dredge Primary Care Rui Wordell: Fulton Reek Other Clinician: Levora Dredge Referring Priseis Cratty: Fulton Reek Treating Sharma Lawrance/Extender: Jeri Cos Weeks in Treatment: 8 Edema Assessment Assessed: [Left: No] [Right: No] Edema: [Left: No] [Right: No] Vascular Assessment Pulses: Dorsalis Pedis Palpable: [Left:Yes] [Right:Yes] Electronic Signature(s) Signed: 09/23/2021 11:53:20 AM By: Levora Dredge Entered By: Levora Dredge on 09/23/2021 11:53:20 [name] ([id number]) -------------------------------------------------------------------------------- Multi Wound Chart Details Patient Name: TREON, KEHL. Date of Service: 09/23/2021 10:30 AM Medical Record Number: [id number] Patient Account Number: [id number] Date of Birth/Sex: Jan 21, 1946 (75 y.o. M) Treating RN: Levora Dredge Primary Care Raja Liska: Fulton Reek Other Clinician: Levora Dredge Referring Elianie Hubers: Fulton Reek Treating Arali Somera/Extender: Skipper Cliche in Treatment: 8 Vital Signs Height(in): 9 Pulse(bpm): 31 Weight(lbs): 53 Blood Pressure(mmHg): 146/66 Body Mass Index(BMI): 39 Temperature(F):  98.1 Respiratory Rate(breaths/min): 18 Photos: [N/A:N/A] Wound Location: Left Calcaneus N/A N/A Wounding Event: Gradually Appeared N/A N/A Primary Etiology: Diabetic Wound/Ulcer of the Lower N/A N/A Extremity Comorbid History: Cataracts, Arrhythmia, Coronary N/A N/A Artery Disease, Hypertension, Type II Diabetes, Osteoarthritis, Neuropathy Date Acquired: 07/07/2021 N/A N/A Weeks of Treatment: 8 N/A N/A Wound Status: Open N/A N/A Measurements L x W x D (cm) 2.6x3.1x0.1 N/A N/A Area (cm) : 6.33 N/A N/A Volume (cm) : 0.633 N/A N/A % Reduction in Area: -187.90% N/A N/A % Reduction in Volume: -187.70% N/A N/A Classification: Grade 2 N/A  N/A Exudate Amount: Medium N/A N/A Exudate Type: Serous N/A N/A Exudate Color: amber N/A N/A Wound Margin: Thickened N/A N/A Granulation Amount: Medium (34-66%) N/A N/A Granulation Quality: Red, Pink N/A N/A Necrotic Amount: Medium (34-66%) N/A N/A Exposed Structures: Fat Layer (Subcutaneous Tissue): N/A N/A Yes Fascia: No Tendon: No Muscle: No Joint: No Bone: No Epithelialization: Small (1-33%) N/A N/A Treatment Notes Electronic Signature(s) Signed: 09/23/2021 11:53:36 AM By: Levora Dredge Entered By: Levora Dredge on 09/23/2021 11:53:36 [name] ([id number]) -------------------------------------------------------------------------------- [place] Details Patient Name: ANDREAS, SOBOLEWSKI. Date of Service: 09/23/2021 10:30 AM Medical Record Number: [id number] Patient Account Number: [id number] Date of Birth/Sex: July 04, 1946 (75 y.o. M) Treating RN: Levora Dredge Primary Care Angie Piercey: Fulton Reek Other Clinician: Levora Dredge Referring Missey Hasley: Fulton Reek Treating Davonte Siebenaler/Extender: Skipper Cliche in Treatment: 8 Active Inactive Wound/Skin Impairment Nursing Diagnoses: Impaired tissue integrity Knowledge deficit related to smoking impact on wound healing Knowledge deficit related to  ulceration/compromised skin integrity Goals: Patient/caregiver will verbalize understanding of skin care regimen Date Initiated: 07/28/2021 Date Inactivated: 08/11/2021 Target Resolution Date: 08/08/2021 Goal Status: Met Ulcer/skin breakdown will have a volume reduction of 30% by week 4 Date Initiated: 07/28/2021 Target Resolution Date: 08/28/2021 Goal Status: Active Ulcer/skin breakdown will have a volume reduction of 50% by week 8 Date Initiated: 07/28/2021 Target Resolution Date: 09/27/2021 Goal Status: Active Ulcer/skin breakdown will have a volume reduction of 80% by week 12 Date Initiated: 07/28/2021 Target Resolution Date: 10/28/2020 Goal Status: Active Ulcer/skin breakdown will heal within 14 weeks Date Initiated: 07/28/2021 Target Resolution Date: 11/12/2020 Goal Status: Active Interventions: Assess patient/caregiver ability to obtain necessary supplies Assess patient/caregiver ability to perform ulcer/skin care regimen upon admission and as needed Assess ulceration(s) every visit Notes: Electronic Signature(s) Signed: 09/23/2021 11:53:26 AM By: Levora Dredge Entered By: Levora Dredge on 09/23/2021 11:53:25 [name] ([id number]) -------------------------------------------------------------------------------- Pain Assessment Details Patient Name: [name]. Date of Service: 09/23/2021 10:30 AM Medical Record Number: [id number] Patient Account Number: [id number] Date of Birth/Sex: May 17, 1946 (75 y.o. M) Treating RN: [name] Primary Care Taniaya Rudder: Fulton Reek Other Clinician: Levora Dredge Referring Xue Low: Fulton Reek Treating Columbia Pandey/Extender: Skipper Cliche in Treatment: 8 Active Problems Location of Pain Severity and Description of Pain Patient Has Paino No Site Locations Rate the pain. Current Pain Level: 0 Pain Management and Medication Current Pain Management: Electronic Signature(s) Signed: 09/23/2021 11:47:11 AM By:  Levora Dredge Signed: 09/23/2021 3:50:05 PM By: [name], BSN, RN, CWS, [name] RN, BSN Entered By: Levora Dredge on 09/23/2021 11:47:11 [name] ([id number]) -------------------------------------------------------------------------------- Patient/Caregiver Education Details Patient Name: SKIP, LITKE. Date of Service: 09/23/2021 10:30 AM Medical Record Number: [id number] Patient Account Number: [id number] Date of Birth/Gender: [birth year] (75 y.o. M) Treating RN: Levora Dredge Primary Care Physician: Fulton Reek Other Clinician: Levora Dredge Referring Physician: Fulton Reek Treating Physician/Extender: Skipper Cliche in Treatment: 8 Education Assessment Education Provided To: Patient Education Topics Provided Wound/Skin Impairment: Handouts: Caring for Your Ulcer Methods: Explain/Verbal Responses: State content correctly Electronic Signature(s) Signed: 09/23/2021 11:58:41 AM By: Levora Dredge Entered By: Levora Dredge on 09/23/2021 11:56:01 [name] ([id number]) -------------------------------------------------------------------------------- Wound Assessment Details Patient Name: KEI, MCELHINEY. Date of Service: 09/23/2021 10:30 AM Medical Record Number: [id number] Patient Account Number: [id number] Date of Birth/Sex: Mar 07, 1946 (75 y.o. M) Treating RN: Levora Dredge Primary Care Kashawn Manzano: Fulton Reek Other Clinician: Levora Dredge Referring Vaishali Baise: Fulton Reek Treating Jaemarie Hochberg/Extender: Jeri Cos Weeks in Treatment: 8 Wound Status Wound Number: 9 Primary Diabetic Wound/Ulcer of the Lower Extremity  Etiology: Wound Location: Left Calcaneus Wound Open Wounding Event: Gradually Appeared Status: Date Acquired: 07/07/2021 Comorbid Cataracts, Arrhythmia, Coronary Artery Disease, Weeks Of Treatment: 8 History: Hypertension, Type II Diabetes, Osteoarthritis, Clustered Wound: No Neuropathy Photos Wound  Measurements Length: (cm) 2.6 Width: (cm) 3.1 Depth: (cm) 0.1 Area: (cm) 6.33 Volume: (cm) 0.633 % Reduction in Area: -187.9% % Reduction in Volume: -187.7% Epithelialization: Small (1-33%) Tunneling: No Undermining: No Wound Description Classification: Grade 2 Wound Margin: Thickened Exudate Amount: Medium Exudate Type: Serous Exudate Color: amber Foul Odor After Cleansing: No Slough/Fibrino Yes Wound Bed Granulation Amount: Medium (34-66%) Exposed Structure Granulation Quality: Red, Pink Fascia Exposed: No Necrotic Amount: Medium (34-66%) Fat Layer (Subcutaneous Tissue) Exposed: Yes Necrotic Quality: Adherent Slough Tendon Exposed: No Muscle Exposed: No Joint Exposed: No Bone Exposed: No Treatment Notes Wound #9 (Calcaneus) Wound Laterality: Left Cleanser Normal Saline Discharge Instruction: Wash your hands with soap and water. Remove old dressing, discard into plastic bag and place into trash. Cleanse the wound with Normal Saline prior to applying a clean dressing using gauze sponges, not tissues or cotton balls. Do not THAD, OSORIA ([id number]) scrub or use excessive force. Pat dry using gauze sponges, not tissue or cotton balls. Peri-Wound Care Topical Primary Dressing Silvercel Small 2x2 (in/in) Discharge Instruction: Apply Silvercel Small 2x2 (in/in) as instructed Secondary Dressing ABD Pad 5x9 (in/in) Discharge Instruction: Cover with ABD pad heel cup Discharge Instruction: apply over dressing to protect heel Secured With 61M Medipore H Soft Cloth Surgical Tape, 2x2 (in/yd) Coban Cohesive Bandage 4x5 (yds) Stretched Discharge Instruction: Apply Coban as directed. Kerlix Roll Sterile or Non-Sterile 6-ply 4.5x4 (yd/yd) Discharge Instruction: Apply Kerlix as directed Compression Wrap Compression Stockings Add-Ons Prevalon boot Discharge Instruction: use while in bed Electronic Signature(s) Signed: 09/23/2021 11:52:29 AM By: Levora Dredge Entered By: Levora Dredge on 09/23/2021 11:52:28 [name] ([id number]) -------------------------------------------------------------------------------- Vitals Details Patient Name: RONDLE, LOHSE. Date of Service: 09/23/2021 10:30 AM Medical Record Number: [id number] Patient Account Number: [id number] Date of Birth/Sex: July 22, 1946 (75 y.o. M) Treating RN: [name] Primary Care Aidian Salomon: Fulton Reek Other Clinician: Levora Dredge Referring Laneya Gasaway: Fulton Reek Treating Saysha Menta/Extender: Skipper Cliche in Treatment: 8 Vital Signs Time Taken: 10:54 Temperature (F): 98.1 Height (in): 69 Pulse (bpm): 62 Weight (lbs): 267 Respiratory Rate (breaths/min): 18 Body Mass Index (BMI): 39.4 Blood Pressure (mmHg): 146/66 Reference Range: 80 - 120 mg / dl Electronic Signature(s) Signed: 09/23/2021 11:47:05 AM By: Levora Dredge Entered By: Levora Dredge on 09/23/2021 11:47:05

## 2021-09-23 NOTE — Progress Notes (Addendum)
[name], [name] ([id number]) Visit Report for 09/23/2021 Chief Complaint Document Details Patient Name: [name], [name]. Date of Service: 09/23/2021 10:30 AM Medical Record Number: [id number] Patient Account Number: [id number] Date of Birth/Sex: 30-Apr-1946 (75 y.o. M) Treating RN: [name] Primary Care Provider: Fulton Reek Other Clinician: Levora Dredge Referring Provider: Fulton Reek Treating Provider/Extender: Skipper Cliche in Treatment: 8 Information Obtained from: Patient Chief Complaint Right LE ulcers and cellulitis Electronic Signature(s) Signed: 09/23/2021 10:58:55 AM By: Worthy Keeler PA-C Entered By: Worthy Keeler on 09/23/2021 10:58:55 GEHRIG, PATRAS ([id number]) -------------------------------------------------------------------------------- HPI Details Patient Name: [name], [name]. Date of Service: 09/23/2021 10:30 AM Medical Record Number: [id number] Patient Account Number: [id number] Date of Birth/Sex: [birth year] (75 y.o. M) Treating RN: [name] Primary Care Provider: Fulton Reek Other Clinician: Levora Dredge Referring Provider: Fulton Reek Treating Provider/Extender: Skipper Cliche in Treatment: 8 History of Present Illness HPI Description: 09/24/2020 on evaluation today patient presents today for a heel ulcer that he tells me has been present for about 2 years. He has been seeing podiatry and they have been attempting to manage this including what sounds to be a total contact cast, Unna boot, and just standard dressings otherwise as well. Most recently has been using triple antibiotic ointment. With that being said unfortunately despite everything he really has not had any significant improvement. He tells me that he cannot even really remember exactly how this began but he presumed it may have rubbed on his shoes or something of that nature. 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 He tells me that this does seem to help. Fortunately there is no signs of active infection at this time. His most recent hemoglobin A1c was 8.1 though he knows this was this year he cannot tell me the exact time. His fluid pills currently to help with some of the lower extremity edema although he does obviously have signs of venous stasis/lymphedema. Currently there is no evidence of active infection. No fevers, chills, nausea, vomiting, or diarrhea. Patient has had fairly recent ABIs which were performed on 07/19/2020 and revealed that he has normal findings in both the ankle and toe locations bilaterally. His ABI on the right was 1.09 on the left was 1.08 with a TBI on the right of 0.88 and on the left of 0.94. Triphasic flow was noted throughout. 10/08/2020 on evaluation today patient appears to be doing pretty well in regard to his left heel currently in fact this is doing a great job and seems to be healing quite nicely. Unfortunately on his right leg he had a pile of wood that actually fell on him injuring his right leg this is somewhat erythematous has me concerned little bit about cellulitis though there is not really a good area to culture at this point. 10/24/2020 upon evaluation today patient appears to be doing well with regard to his heel ulcer. He is showing signs of improvement which is great news. His right leg is completely healed. Overall I feel like he is doing excellent and there is no signs of infection. 11/07/2020 upon evaluation today patient appears to be doing well with regard to his heel ulcer. He tells me that last week when  he was unable to come in his wife actually thought that the wound was very close to closing if not closed. Then it  began to "reopen again". I really feel like what may have happened as the collagen may have dried over the wound bed and that because that misunderstanding with thinking that the wound was healing. With that being said I did not see it last week I do not know that for certain. Either way I feel like he is doing great today I see no signs of infection at this point. 11/26/2020 upon inspection today patient appears to be doing decently well in regard to his heel ulcer. He has been tolerating the dressing changes without complication. Fortunately there is no sign of active infection at this time. No fevers, chills, nausea, vomiting, or diarrhea. 12/10/2020 upon evaluation today patient appears to be doing fairly well in regard to the wound on his heel as well as what appears to be a new wound of the left first metatarsal head plantar aspect. This seems to be an area that was callus that has split as the patient tells me has been trying to walk on his toes more has probably where this came from. With that being said there does not appear to be signs of active infection which is great news. 12/17/2020 upon evaluation today patient actually appears to be making good progress currently. Fortunately there is no evidence of active infection at this time. Overall I feel like he is very close to complete closure. 12/26/2020 upon evaluation today patient appears to be doing excellent in regard to his wounds. In fact I am not certain that these are not even completely healed on initial inspection. Overall I am very pleased with where things stand today. Good news is that they are healed he is actually get ready to go out of town and that will be helpful as well as he will be a full part of the time. Readmission: 02/04/2021 upon evaluation today patient appears to be doing well at this point in regard to his left heel that I previously saw him for. Unfortunately he is having issues with his right lower extremity. He has  significant wounds at this point he also has erythema noted there is definite signs of cellulitis which is unfortunate. With that being said I think we do need to address this sooner rather than later. The good news is he did have a nice trip to the beach. He tells me that he had no issues during that time. 4/27; patient on Bactrim. Culture showed methicillin sensitive staph aureus therefore the Bactrim should be effective. 2 small areas on the right leg are healed the area on the mid aspect of the tibia almost 100% covered in a very adherent necrotic debris. We have been using silver alginate 02/20/2021 upon evaluation today patient appears to be doing well with regard to his wound. He is showing signs of improvement which is great news overall very pleased with where things stand today. No fevers, chills, nausea, vomiting, or diarrhea. 02/27/2021 upon evaluation today patient appears to be doing well with regard to his leg ulcer. He is tolerating the dressing changes and overall appears to be doing quite excellent. I am extremely pleased with where things stand and overall I think patient is making great progress. There is no sign of active infection at this time which is also great news. 03/06/2021 upon evaluation today patient appears to be doing excellent in regard to his wounds.  In fact he appears to be completely healed today based on what I am seeing. This is excellent news and overall I am extremely pleased with where he stands. Overall the patient is happy to hear this as well this has been quite sometime coming. Readmission: 04/01/2021 patient unfortunately returns for readmission today. He tells me that he has been wearing his compression socks on the right leg daily MARINUS, EICHER ([id number]) and he does not really know what is going on and why his legs are doing what they are doing. We did therefore go ahead and probe deeper into exactly what has been going on with him today.  Subsequently the patient tells me that when he gets up and what we would call "first thing in the morning" are really 2 different things". He does not tend to sleep well so he tells me that he will often wake up at 3:00 in the morning. He will then potentially going into the living room to get in his chair where he may read a book for a little while and then potentially fall asleep back in his chair. He then subsequently wake up around 5:00 or so and then get up and in his words "putter around". This often will end with him proceeding at some point around 8 AM or 9 AM to putting on his compression socks. With that being said this means that anywhere from the 3:00 in the morning till roughly around 9:00 in the morning he has no compression on yet he is sitting in his recliner, walking around and up and about, and this is at least 5 to 6 hours of noncompressed time. That may be our issue here but I did not realize until we discussed this further today. Fortunately there does not appear to be any signs of active infection at this time which is great news. With that being said he has multiple wounds of the bilateral lower extremities. 04/10/2021 upon evaluation today patient appears to be doing well with regard to his legs for the most part. He does look like he had some injury where his wrap slid down nonetheless he did some "doctoring on them". I do feel like most of the areas honestly have cleared back up which is great news. There does not appear to be any signs of active infection at this time which is also great news. No fevers, chills, nausea, vomiting, or diarrhea. 04/18/2021 upon evaluation today patient appears to be doing well with regard to his wounds in fact he appears to be completely healed which is great news. Fortunately there does not appear to be any signs of active infection which is great news. No fevers, chills, nausea, vomiting, or diarrhea. READMISSION 07/28/2021 This is a now  75 year old man we have had in this clinic for quite a bit of this year. 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 He wears compression stockings. 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 He comes in an old running shoes although he says he wears better shoes at home. His history is that he noticed a blister in the right posterior heel. This open. He has been applying Neosporin to it currently the wound measures 2 x 1.4 cm. 100% slough covered. Not really offloading this in any rigorous way. Past medical history is essentially unchanged he has paroxysmal atrial fibrillation on Coumadin type 2 diabetes with a recent hemoglobin A1c of  7 on metformin. ABI in our clinic was 0.98 on the right 08/05/2021 upon evaluation today patient appears to be doing well with regard to his heel ulcer. Fortunately there does not appear to be any signs of active infection at this time. Overall I been very pleased with where things seem to be currently as far as the wound healing is concerned. Again this is first a lot of seeing him Dr. Dellia Nims readmitted him last week. Nonetheless I think we are definitely making some progress here. 08/11/2021 upon evaluation today patient's wound on the heel actually showing signs of excellent improvement. I am actually very pleased with where things stand currently. No fevers, chills, nausea, vomiting, or diarrhea. There does not appear to be any need for sharp debridement today either which is also great news. 08/25/2021 upon evaluation today patient appears to be doing well with regard to his heel ulcer. This is actually looking significantly improved compared to last time I saw him. Fortunately there does not appear to be any evidence of active infection at this time. No fevers, chills, nausea, vomiting, or diarrhea. 09/01/2021 upon evaluation today patient appears to be  doing decently well in regard to his wounds. Fortunately there does not appear to be any signs of active infection at this time which is great news and overall very pleased in that regard. I do not see any evidence of active infection systemically which is great news as well. No fevers, chills, nausea, vomiting, or diarrhea. I think that the heel is making excellent progress. 09/15/2021 upon evaluation today patient's heel unfortunately is significantly worse compared to what it was previous. I do believe that at this time he would benefit from switching back to something a little bit more offloading from the cushion shoe he has right now this is more of a heel protector what I really need is a Prevalon offloading boot which I think is good to do a lot better for him than just the small heel protector. 09/23/2021 upon evaluation today patient appears to be doing a little better in regards to last week's visit. Overall I think that he is making good progress which is great news and there does not appear to be any evidence of active infection at this time. No fevers, chills, nausea, vomiting, or diarrhea. Electronic Signature(s) Signed: 09/23/2021 11:51:41 AM By: Worthy Keeler PA-C Entered By: Worthy Keeler on 09/23/2021 11:51:41 GILLES, TRIMPE ([id number]) -------------------------------------------------------------------------------- Physical Exam Details Patient Name: MIKAH, POSS Date of Service: 09/23/2021 10:30 AM Medical Record Number: [id number] Patient Account Number: [id number] Date of Birth/Sex: 04-30-1946 (75 y.o. M) Treating RN: [name] Primary Care Provider: Fulton Reek Other Clinician: Levora Dredge Referring Provider: Fulton Reek Treating Provider/Extender: Skipper Cliche in Treatment: 8 Constitutional Well-nourished and well-hydrated in no acute distress. Respiratory normal breathing without difficulty. Psychiatric this patient is able to make  decisions and demonstrates good insight into disease process. Alert and Oriented x 3. pleasant and cooperative. Notes Patient's wound bed showed evidence of good granulation and epithelization at this point. Fortunately I do not see any evidence of active infection locally nor systemically currently which is also great news. No debridement was necessary today and I do feel like this is significantly improved compared to last week. Electronic Signature(s) Signed: 09/23/2021 11:52:00 AM By: Worthy Keeler PA-C Entered By: Worthy Keeler on 09/23/2021 11:52:00 [name] ([id number]) -------------------------------------------------------------------------------- Physician Orders Details Patient Name: DARVIS, CROFT. Date of Service: 09/23/2021 10:30 AM Medical  Record Number: [id number] Patient Account Number: [id number] Date of Birth/Sex: 14-[name]-1946 (75 y.o. 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 o Nurse Visit as needed - call to schedule Bathing/ Shower/ Hygiene o May shower with wound dressing protected with water repellent cover or cast protector. o No tub bath. Edema Control - Lymphedema / Segmental Compressive Device / Other o Patient to wear own compression stockings. Remove compression stockings every night before going to bed and put on every morning when getting up. - right leg o Elevate leg(s) parallel to the floor when sitting. o DO YOUR BEST to sleep in the bed at  night. DO NOT sleep in your recliner. Long hours of sitting in a recliner leads to swelling of the legs and/or potential wounds on your backside. Off-Loading o Other: - L heel offloader shoe Keep pressure off of heel in the bed-float heel in [place] order from [employer], see sheet Additional Orders / Instructions o Follow Nutritious Diet and Increase Protein Intake - check blood sugar and eat protein for wound healing Wound Treatment Wound #9 - Calcaneus Wound Laterality: Left Cleanser: Normal Saline Every Other Day/15 Days Discharge Instructions: Wash your hands with soap and water. Remove old dressing, discard into plastic bag and place into trash. Cleanse the wound with Normal Saline prior to applying a clean dressing using gauze sponges, not tissues or cotton balls. Do not scrub or use excessive force. Pat dry using gauze sponges, not tissue or cotton balls. Primary Dressing: Silvercel Small 2x2 (in/in) (Generic) Every Other Day/15 Days Discharge Instructions: Apply Silvercel Small 2x2 (in/in) as instructed Secondary Dressing: ABD Pad 5x9 (in/in) (Generic) Every Other Day/15 Days Discharge Instructions: Cover with ABD pad Secondary Dressing: heel cup Every Other Day/15 Days Discharge Instructions: apply over dressing to protect heel Secured With: 73M Medipore H Soft Cloth Surgical Tape, 2x2 (in/yd) (Generic) Every Other Day/15 Days Secured With: Coban Cohesive Bandage 4x5 (yds) Stretched Every Other Day/15 Days Discharge Instructions: Apply Coban as directed. Secured With: [employer] or Non-Sterile 6-ply 4.5x4 (yd/yd) (Generic) Every Other Day/15 Days Discharge Instructions: Apply Kerlix as directed Add-Ons: Prevalon boot Every Other Day/15 Days Discharge Instructions: use while in bed JARRYN, ALTLAND ([id number]) Electronic Signature(s) Signed: 09/23/2021 11:54:39 AM By: Levora Dredge Signed: 09/23/2021 4:19:41 PM By: Worthy Keeler PA-C Entered By: Levora Dredge on 09/23/2021 11:54:38 DRAKE, WUERTZ ([id number]) -------------------------------------------------------------------------------- Problem List Details Patient Name: MARTISE, WADDELL. Date of Service: 09/23/2021 10:30 AM Medical Record Number: [id number] Patient Account Number: [id number] Date of Birth/Sex: 16-Nov-1945 (75 y.o. 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 Date of Service: 09/23/2021 10:30 AM Medical Record Number: [id number] Patient Account Number: [id number] Date of Birth/Sex: [birth year] (75 y.o. M) Treating RN: [name] Primary Care Provider: Fulton Reek Other Clinician: Levora Dredge Referring Provider: Fulton Reek Treating Provider/Extender: Skipper Cliche in Treatment: 8 Subjective Chief Complaint Information obtained from Patient Right LE ulcers and cellulitis History of Present Illness (HPI) 09/24/2020 on evaluation today patient presents today for a heel ulcer that he tells me has been present for about 2 years. He has been seeing podiatry and they have been attempting to manage this including what sounds to be a total contact  cast, Unna boot, and just standard dressings otherwise as well. Most recently has been using triple antibiotic ointment. With that being said unfortunately despite everything he really has not had any significant improvement. He tells me that he cannot even really remember exactly how this began but he presumed it may have rubbed on his shoes or something of that nature. 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 He tells me that this does seem to help. Fortunately there is no signs of active infection at this time. His most recent hemoglobin A1c was 8.1 though he knows this was this year he cannot tell me the exact time. His fluid pills currently to help with some of the lower extremity edema although he does obviously have signs of venous stasis/lymphedema. Currently there is no evidence of active infection. No fevers, chills, nausea, vomiting, or diarrhea. Patient has had fairly recent ABIs which were performed on 07/19/2020 and revealed that he has normal findings in both the ankle and toe locations bilaterally. His ABI on the right was 1.09 on the left was 1.08 with a TBI on the right of 0.88 and on the left of 0.94. Triphasic flow was noted throughout. 10/08/2020 on evaluation today patient appears to be doing pretty well in regard to his left heel currently in fact this is doing a great job and seems to be healing quite nicely. Unfortunately on his right leg he had a pile of wood that actually fell on him injuring his right leg this is somewhat erythematous has me concerned little bit about cellulitis though there is not really a good area to culture at this point. 10/24/2020 upon evaluation today patient appears to be  doing well with regard to his heel ulcer. He is showing signs of improvement which is great news. His right leg is completely healed. Overall I feel like he is doing excellent and there is no signs of infection. 11/07/2020 upon evaluation today patient appears to be doing well with regard to his heel ulcer. He tells me that last week when he was unable to come in his wife actually thought that the wound was very close to closing if not closed. Then it began to "reopen again". I really feel like what may have happened as the collagen may have dried over the wound bed and that because that misunderstanding with thinking that the wound was healing. With that being said I did not see it last week I do not know that for certain. Either way  I feel like he is doing great today I see no signs of infection at this point. 11/26/2020 upon inspection today patient appears to be doing decently well in regard to his heel ulcer. He has been tolerating the dressing changes without complication. Fortunately there is no sign of active infection at this time. No fevers, chills, nausea, vomiting, or diarrhea. 12/10/2020 upon evaluation today patient appears to be doing fairly well in regard to the wound on his heel as well as what appears to be a new wound of the left first metatarsal head plantar aspect. This seems to be an area that was callus that has split as the patient tells me has been trying to walk on his toes more has probably where this came from. With that being said there does not appear to be signs of active infection which is great news. 12/17/2020 upon evaluation today patient actually appears to be making good progress currently. Fortunately there is no evidence of active infection at this time. Overall I feel like he is very close to complete closure. 12/26/2020 upon evaluation today patient appears to be doing excellent in regard to his wounds. In fact I am not certain that these are not even completely  healed on initial inspection. Overall I am very pleased with where things stand today. Good news is that they are healed he is actually get ready to go out of town and that will be helpful as well as he will be a full part of the time. Readmission: 02/04/2021 upon evaluation today patient appears to be doing well at this point in regard to his left heel that I previously saw him for. Unfortunately he is having issues with his right lower extremity. He has significant wounds at this point he also has erythema noted there is definite signs of cellulitis which is unfortunate. With that being said I think we do need to address this sooner rather than later. The good news is he did have a nice trip to the beach. He tells me that he had no issues during that time. 4/27; patient on Bactrim. Culture showed methicillin sensitive staph aureus therefore the Bactrim should be effective. 2 small areas on the right leg are healed the area on the mid aspect of the tibia almost 100% covered in a very adherent necrotic debris. We have been using silver alginate 02/20/2021 upon evaluation today patient appears to be doing well with regard to his wound. He is showing signs of improvement which is great news overall very pleased with where things stand today. No fevers, chills, nausea, vomiting, or diarrhea. 02/27/2021 upon evaluation today patient appears to be doing well with regard to his leg ulcer. He is tolerating the dressing changes and overall appears to be doing quite excellent. I am extremely pleased with where things stand and overall I think patient is making great progress. There is no sign of active infection at this time which is also great news. 03/06/2021 upon evaluation today patient appears to be doing excellent in regard to his wounds. In fact he appears to be completely healed today based on what I am seeing. This is excellent news and overall I am extremely pleased with where he stands. Overall the  patient is happy to hear AEDON, DEASON ([id number]) this as well this has been quite sometime coming. Readmission: 04/01/2021 patient unfortunately returns for readmission today. He tells me that he has been wearing his compression socks on the right leg daily and he does not  really know what is going on and why his legs are doing what they are doing. We did therefore go ahead and probe deeper into exactly what has been going on with him today. Subsequently the patient tells me that when he gets up and what we would call "first thing in the morning" are really 2 different things". He does not tend to sleep well so he tells me that he will often wake up at 3:00 in the morning. He will then potentially going into the living room to get in his chair where he may read a book for a little while and then potentially fall asleep back in his chair. He then subsequently wake up around 5:00 or so and then get up and in his words "putter around". This often will end with him proceeding at some point around 8 AM or 9 AM to putting on his compression socks. With that being said this means that anywhere from the 3:00 in the morning till roughly around 9:00 in the morning he has no compression on yet he is sitting in his recliner, walking around and up and about, and this is at least 5 to 6 hours of noncompressed time. That may be our issue here but I did not realize until we discussed this further today. Fortunately there does not appear to be any signs of active infection at this time which is great news. With that being said he has multiple wounds of the bilateral lower extremities. 04/10/2021 upon evaluation today patient appears to be doing well with regard to his legs for the most part. He does look like he had some injury where his wrap slid down nonetheless he did some "doctoring on them". I do feel like most of the areas honestly have cleared back up which is great news. There does not appear to be any  signs of active infection at this time which is also great news. No fevers, chills, nausea, vomiting, or diarrhea. 04/18/2021 upon evaluation today patient appears to be doing well with regard to his wounds in fact he appears to be completely healed which is great news. Fortunately there does not appear to be any signs of active infection which is great news. No fevers, chills, nausea, vomiting, or diarrhea. READMISSION 07/28/2021 This is a now 75 year old man we have had in this clinic for quite a bit of this year. 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 He wears compression stockings. 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 He comes in an old running shoes although he says he wears better shoes at home. His history is that he noticed a blister in the right posterior heel. This open. He has been applying Neosporin to it currently the wound measures 2 x 1.4 cm. 100% slough covered. Not really offloading this in any rigorous way. Past medical history is essentially unchanged he has paroxysmal atrial fibrillation on Coumadin type 2 diabetes with a recent hemoglobin A1c of 7 on metformin. ABI in our clinic was 0.98 on the right 08/05/2021 upon evaluation today patient appears to be doing well with regard to his heel ulcer. Fortunately there does not appear to be any signs of active infection at this time. Overall I been very pleased with where things seem to be currently as far as the wound healing is concerned. Again this is first a lot of seeing him Dr. Dellia Nims readmitted him last week.  Nonetheless I think we are definitely making some progress here. 08/11/2021 upon evaluation today patient's wound on the heel actually showing signs of excellent improvement. I am actually very pleased with where things stand currently. No fevers, chills, nausea, vomiting, or diarrhea. There does not  appear to be any need for sharp debridement today either which is also great news. 08/25/2021 upon evaluation today patient appears to be doing well with regard to his heel ulcer. This is actually looking significantly improved compared to last time I saw him. Fortunately there does not appear to be any evidence of active infection at this time. No fevers, chills, nausea, vomiting, or diarrhea. 09/01/2021 upon evaluation today patient appears to be doing decently well in regard to his wounds. Fortunately there does not appear to be any signs of active infection at this time which is great news and overall very pleased in that regard. I do not see any evidence of active infection systemically which is great news as well. No fevers, chills, nausea, vomiting, or diarrhea. I think that the heel is making excellent progress. 09/15/2021 upon evaluation today patient's heel unfortunately is significantly worse compared to what it was previous. I do believe that at this time he would benefit from switching back to something a little bit more offloading from the cushion shoe he has right now this is more of a heel protector what I really need is a Prevalon offloading boot which I think is good to do a lot better for him than just the small heel protector. 09/23/2021 upon evaluation today patient appears to be doing a little better in regards to last week's visit. Overall I think that he is making good progress which is great news and there does not appear to be any evidence of active infection at this time. No fevers, chills, nausea, vomiting, or diarrhea. Objective Constitutional Well-nourished and well-hydrated in no acute distress. Vitals Time Taken: 10:54 AM, Height: 69 in, Weight: 267 lbs, BMI: 39.4, Temperature: 98.1 F, Pulse: 62 bpm, Respiratory Rate: 18 breaths/min, Blood Pressure: 146/66 mmHg. ANTONINO, NIENHUIS ([id number]) Respiratory normal breathing without difficulty. Psychiatric this  patient is able to make decisions and demonstrates good insight into disease process. Alert and Oriented x 3. pleasant and cooperative. General Notes: Patient's wound bed showed evidence of good granulation and epithelization at this point. Fortunately I do not see any evidence of active infection locally nor systemically currently which is also great news. No debridement was necessary today and I do feel like this is significantly improved compared to last week. Integumentary (Hair, Skin) Wound #9 status is Open. Original cause of wound was Gradually Appeared. The date acquired was: 07/07/2021. The wound has been in treatment 8 weeks. The wound is located on the Left Calcaneus. The wound measures 2.6cm length x 3.1cm width x 0.1cm depth; 6.33cm^2 area and 0.633cm^3 volume. There is a medium amount of serosanguineous drainage noted. 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 Nurse Visit as needed - call to schedule Bathing/ Shower/ Hygiene: May shower with wound dressing protected with water repellent cover or cast protector. No tub bath. Edema Control - Lymphedema / Segmental Compressive Device / Other: Patient to wear own compression stockings. Remove compression stockings every night before going to bed and put on every morning when getting up. - right  leg Elevate leg(s) parallel to the floor when sitting. DO YOUR BEST to sleep in the bed at night. DO NOT sleep in your recliner. Long hours of sitting in a recliner leads to swelling of the legs and/or potential wounds on your backside. Off-Loading: Other: - L heel offloader shoe Keep pressure off of heel in the bed-float heel in [place] order from [employer], see sheet Additional Orders /  Instructions: Follow Nutritious Diet and Increase Protein Intake - check blood sugar and eat protein for wound healing WOUND #9: - Calcaneus Wound Laterality: Left Cleanser: Normal Saline Every Other Day/15 Days Discharge Instructions: Wash your hands with soap and water. Remove old dressing, discard into plastic bag and place into trash. Cleanse the wound with Normal Saline prior to applying a clean dressing using gauze sponges, not tissues or cotton balls. Do not scrub or use excessive force. Pat dry using gauze sponges, not tissue or cotton balls. Primary Dressing: Silvercel Small 2x2 (in/in) (Generic) Every Other Day/15 Days Discharge Instructions: Apply Silvercel Small 2x2 (in/in) as instructed Secondary Dressing: ABD Pad 5x9 (in/in) (Generic) Every Other Day/15 Days Discharge Instructions: Cover with ABD pad Secondary Dressing: heel cup Every Other Day/15 Days Discharge Instructions: apply over dressing to protect heel Secured With: 79M Medipore H Soft Cloth Surgical Tape, 2x2 (in/yd) (Generic) Every Other Day/15 Days Secured With: Coban Cohesive Bandage 4x5 (yds) Stretched Every Other Day/15 Days Discharge Instructions: Apply Coban as directed. Secured With: [employer] or Non-Sterile 6-ply 4.5x4 (yd/yd) (Generic) Every Other Day/15 Days Discharge Instructions: Apply Kerlix as directed Add-Ons: Prevalon boot Every Other Day/15 Days Discharge Instructions: use while in bed 1. I would recommend that we go ahead and continue with the wound care measures as before and the patient is in agreement with the plan. This includes the use of the silver alginate dressing followed by dry gauze. TRUE, GARCIAMARTINEZ ([id number]) 2. We use a heel cup over this and secure with roll gauze. 3. I would recommend he continue to elevate his legs much as possible keep pressure off the heel as much as he can. We will see patient back for reevaluation in 1 week here in the clinic. If anything worsens or  changes patient will contact our office for additional recommendations. Electronic Signature(s) Signed: 09/23/2021 4:19:26 PM By: Worthy Keeler PA-C Previous Signature: 09/23/2021 11:52:29 AM Version By: Worthy Keeler PA-C Entered By: Worthy Keeler on 09/23/2021 16:19:26 DAYSHAWN, IRIZARRY ([id number]) -------------------------------------------------------------------------------- SuperBill Details Patient Name: DEMICO, PLOCH. Date of Service: 09/23/2021 Medical Record Number: [id number] Patient Account Number: [id number] Date of Birth/Sex: [birth year] (75 y.o. M) Treating RN: [name] Primary Care Provider: Fulton Reek Other Clinician: Levora Dredge Referring Provider: Fulton Reek Treating Provider/Extender: Skipper Cliche in Treatment: 8 Diagnosis Coding ICD-10 Codes Code Description E11.621 Type 2 diabetes mellitus with foot ulcer L97.421 Non-pressure chronic ulcer of left heel and midfoot limited to breakdown of skin E11.42 Type 2 diabetes mellitus with diabetic polyneuropathy Physician Procedures CPT4 Code: [id number] Description: 99213 - WC PHYS LEVEL 3 - EST PT Modifier: Quantity: 1 CPT4 Code: Description: ICD-10 Diagnosis Description E11.621 Type 2 diabetes mellitus with foot ulcer L97.421 Non-pressure chronic ulcer of left heel and midfoot limited to breakdo E11.42 Type 2 diabetes mellitus with diabetic polyneuropathy Modifier: wn of skin Quantity: Electronic Signature(s) Signed: 09/23/2021 11:52:56 AM By: Worthy Keeler PA-C Entered By: Worthy Keeler on 09/23/2021 11:52:55

## 2021-09-30 ENCOUNTER — Encounter: Payer: Medicare HMO | Admitting: Physician Assistant

## 2021-09-30 ENCOUNTER — Other Ambulatory Visit: Payer: Self-pay

## 2021-09-30 DIAGNOSIS — E11621 Type 2 diabetes mellitus with foot ulcer: Secondary | ICD-10-CM | POA: Diagnosis not present

## 2021-09-30 DIAGNOSIS — Z79891 Long term (current) use of opiate analgesic: Secondary | ICD-10-CM | POA: Diagnosis not present

## 2021-09-30 DIAGNOSIS — Z7901 Long term (current) use of anticoagulants: Secondary | ICD-10-CM | POA: Diagnosis not present

## 2021-09-30 DIAGNOSIS — L97421 Non-pressure chronic ulcer of left heel and midfoot limited to breakdown of skin: Secondary | ICD-10-CM | POA: Diagnosis not present

## 2021-09-30 DIAGNOSIS — Z952 Presence of prosthetic heart valve: Secondary | ICD-10-CM | POA: Diagnosis not present

## 2021-09-30 DIAGNOSIS — I480 Paroxysmal atrial fibrillation: Secondary | ICD-10-CM | POA: Diagnosis not present

## 2021-09-30 DIAGNOSIS — Z79899 Other long term (current) drug therapy: Secondary | ICD-10-CM | POA: Diagnosis not present

## 2021-09-30 DIAGNOSIS — G8929 Other chronic pain: Secondary | ICD-10-CM | POA: Diagnosis not present

## 2021-09-30 DIAGNOSIS — E1142 Type 2 diabetes mellitus with diabetic polyneuropathy: Secondary | ICD-10-CM | POA: Diagnosis not present

## 2021-09-30 NOTE — Progress Notes (Addendum)
CALIB, WADHWA ([id number]) Visit Report for 09/30/2021 Chief Complaint Document Details Patient Name: [name], [name]. Date of Service: 09/30/2021 10:30 AM Medical Record Number: [id number] Patient Account Number: [id number] Date of Birth/Sex: 10-Nov-1945 (75 y.o. M) Treating RN: [name] Primary Care Provider: Fulton Reek Other Clinician: Referring Provider: Fulton Reek Treating Provider/Extender: Skipper Cliche in Treatment: 9 Information Obtained from: Patient Chief Complaint Right LE ulcers and cellulitis Electronic Signature(s) Signed: 09/30/2021 10:41:10 AM By: Worthy Keeler PA-C Entered By: Worthy Keeler on 09/30/2021 10:41:10 [name] ([id number]) -------------------------------------------------------------------------------- Debridement Details Patient Name: [name], [name]. Date of Service: 09/30/2021 10:30 AM Medical Record Number: [id number] Patient Account Number: [id number] Date of Birth/Sex: 1946-02-24 (75 y.o. M) Treating RN: Levora Dredge Primary Care Provider: Fulton Reek Other Clinician: Referring Provider: Fulton Reek Treating Provider/Extender: Skipper Cliche in Treatment: 9 Debridement Performed for Wound #9 Left Calcaneus Assessment: Performed By: Physician Tommie Sams., PA-C Debridement Type: Debridement Severity of Tissue Pre Debridement: Fat layer exposed Level of Consciousness (Pre- Awake and Alert procedure): Pre-procedure Verification/Time Out Yes - 11:21 Taken: Total Area Debrided (L x W): 2.3 (cm) x 2.7 (cm) = 6.21 (cm) Tissue and other material Viable, Non-Viable, Callus, Slough, Subcutaneous, Biofilm, Slough debrided: Level: Skin/Subcutaneous Tissue Debridement Description: Excisional Instrument: Curette Bleeding: Minimum Hemostasis Achieved: Pressure Response to Treatment: Procedure was tolerated well Level of Consciousness (Post- Awake and Alert procedure): Post Debridement  Measurements of Total Wound Length: (cm) 2.3 Width: (cm) 2.7 Depth: (cm) 0.1 Volume: (cm) 0.488 Character of Wound/Ulcer Post Debridement: Stable Severity of Tissue Post Debridement: Fat layer exposed Post Procedure Diagnosis Same as Pre-procedure Electronic Signature(s) Signed: 09/30/2021 5:37:18 PM By: Worthy Keeler PA-C Signed: 10/01/2021 4:05:40 PM By: Levora Dredge Entered By: Levora Dredge on 09/30/2021 11:24:43 [name] ([id number]) -------------------------------------------------------------------------------- HPI Details Patient Name: [name], [name]. Date of Service: 09/30/2021 10:30 AM Medical Record Number: [id number] Patient Account Number: [id number] Date of Birth/Sex: [birth year] (75 y.o. M) Treating RN: [name] Primary Care Provider: Fulton Reek Other Clinician: Referring Provider: Fulton Reek Treating Provider/Extender: Skipper Cliche in Treatment: 9 History of Present Illness HPI Description: 09/24/2020 on evaluation today patient presents today for a heel ulcer that he tells me has been present for about 2 years. He has been seeing podiatry and they have been attempting to manage this including what sounds to be a total contact cast, Unna boot, and just standard dressings otherwise as well. Most recently has been using triple antibiotic ointment. With that being said unfortunately despite everything he really has not had any significant improvement. He tells me that he cannot even really remember exactly how this began but he presumed it may have rubbed on his shoes or something of that nature. 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 He tells me that this  does seem to help. Fortunately there is no signs of active infection at this time. His most recent hemoglobin A1c was 8.1 though he knows this was this year he cannot tell me the exact time. His fluid pills currently to help with some of the lower extremity edema although he does obviously have signs of venous stasis/lymphedema. Currently there is no evidence of active infection.  No fevers, chills, nausea, vomiting, or diarrhea. Patient has had fairly recent ABIs which were performed on 07/19/2020 and revealed that he has normal findings in both the ankle and toe locations bilaterally. His ABI on the right was 1.09 on the left was 1.08 with a TBI on the right of 0.88 and on the left of 0.94. Triphasic flow was noted throughout. 10/08/2020 on evaluation today patient appears to be doing pretty well in regard to his left heel currently in fact this is doing a great job and seems to be healing quite nicely. Unfortunately on his right leg he had a pile of wood that actually fell on him injuring his right leg this is somewhat erythematous has me concerned little bit about cellulitis though there is not really a good area to culture at this point. 10/24/2020 upon evaluation today patient appears to be doing well with regard to his heel ulcer. He is showing signs of improvement which is great news. His right leg is completely healed. Overall I feel like he is doing excellent and there is no signs of infection. 11/07/2020 upon evaluation today patient appears to be doing well with regard to his heel ulcer. He tells me that last week when he was unable to come in his wife actually thought that the wound was very close to closing if not closed. Then it began to "reopen again". I really feel like what may have happened as the collagen may have dried over the wound bed and that because that misunderstanding with thinking that the wound was healing. With that being said I did not see it last week I do not know that for  certain. Either way I feel like he is doing great today I see no signs of infection at this point. 11/26/2020 upon inspection today patient appears to be doing decently well in regard to his heel ulcer. He has been tolerating the dressing changes without complication. Fortunately there is no sign of active infection at this time. No fevers, chills, nausea, vomiting, or diarrhea. 12/10/2020 upon evaluation today patient appears to be doing fairly well in regard to the wound on his heel as well as what appears to be a new wound of the left first metatarsal head plantar aspect. This seems to be an area that was callus that has split as the patient tells me has been trying to walk on his toes more has probably where this came from. With that being said there does not appear to be signs of active infection which is great news. 12/17/2020 upon evaluation today patient actually appears to be making good progress currently. Fortunately there is no evidence of active infection at this time. Overall I feel like he is very close to complete closure. 12/26/2020 upon evaluation today patient appears to be doing excellent in regard to his wounds. In fact I am not certain that these are not even completely healed on initial inspection. Overall I am very pleased with where things stand today. Good news is that they are healed he is actually get ready to go out of town and that will be helpful as well as he will be a full part of the time. Readmission: 02/04/2021 upon evaluation today patient appears to be doing well at this point in regard to his left heel that I previously saw him for. Unfortunately he is having issues with his right lower extremity. He has significant wounds at this point he also has erythema noted there is definite signs of  cellulitis which is unfortunate. With that being said I think we do need to address this sooner rather than later. The good news is he did have a nice trip to the beach. He tells me  that he had no issues during that time. 4/27; patient on Bactrim. Culture showed methicillin sensitive staph aureus therefore the Bactrim should be effective. 2 small areas on the right leg are healed the area on the mid aspect of the tibia almost 100% covered in a very adherent necrotic debris. We have been using silver alginate 02/20/2021 upon evaluation today patient appears to be doing well with regard to his wound. He is showing signs of improvement which is great news overall very pleased with where things stand today. No fevers, chills, nausea, vomiting, or diarrhea. 02/27/2021 upon evaluation today patient appears to be doing well with regard to his leg ulcer. He is tolerating the dressing changes and overall appears to be doing quite excellent. I am extremely pleased with where things stand and overall I think patient is making great progress. There is no sign of active infection at this time which is also great news. 03/06/2021 upon evaluation today patient appears to be doing excellent in regard to his wounds. In fact he appears to be completely healed today based on what I am seeing. This is excellent news and overall I am extremely pleased with where he stands. Overall the patient is happy to hear this as well this has been quite sometime coming. Readmission: 04/01/2021 patient unfortunately returns for readmission today. He tells me that he has been wearing his compression socks on the right leg daily LENFORD, BEDDOW ([id number]) and he does not really know what is going on and why his legs are doing what they are doing. We did therefore go ahead and probe deeper into exactly what has been going on with him today. Subsequently the patient tells me that when he gets up and what we would call "first thing in the morning" are really 2 different things". He does not tend to sleep well so he tells me that he will often wake up at 3:00 in the morning. He will then potentially going into the  living room to get in his chair where he may read a book for a little while and then potentially fall asleep back in his chair. He then subsequently wake up around 5:00 or so and then get up and in his words "putter around". This often will end with him proceeding at some point around 8 AM or 9 AM to putting on his compression socks. With that being said this means that anywhere from the 3:00 in the morning till roughly around 9:00 in the morning he has no compression on yet he is sitting in his recliner, walking around and up and about, and this is at least 5 to 6 hours of noncompressed time. That may be our issue here but I did not realize until we discussed this further today. Fortunately there does not appear to be any signs of active infection at this time which is great news. With that being said he has multiple wounds of the bilateral lower extremities. 04/10/2021 upon evaluation today patient appears to be doing well with regard to his legs for the most part. He does look like he had some injury where his wrap slid down nonetheless he did some "doctoring on them". I do feel like most of the areas honestly have cleared back up which is great news.  There does not appear to be any signs of active infection at this time which is also great news. No fevers, chills, nausea, vomiting, or diarrhea. 04/18/2021 upon evaluation today patient appears to be doing well with regard to his wounds in fact he appears to be completely healed which is great news. Fortunately there does not appear to be any signs of active infection which is great news. No fevers, chills, nausea, vomiting, or diarrhea. READMISSION 07/28/2021 This is a now 75 year old man we have had in this clinic for quite a bit of this year. 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 He wears compression stockings. 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 He comes in an old running shoes although he says he wears better shoes at home. His history is that he noticed a blister in the right posterior heel. This open. He has been applying Neosporin to it currently the wound measures 2 x 1.4 cm. 100% slough covered. Not really offloading this in any rigorous way. Past medical history is essentially unchanged he has paroxysmal atrial fibrillation on Coumadin type 2 diabetes with a recent hemoglobin A1c of 7 on metformin. ABI in our clinic was 0.98 on the right 08/05/2021 upon evaluation today patient appears to be doing well with regard to his heel ulcer. Fortunately there does not appear to be any signs of active infection at this time. Overall I been very pleased with where things seem to be currently as far as the wound healing is concerned. Again this is first a lot of seeing him Dr. Dellia Nims readmitted him last week. Nonetheless I think we are definitely making some progress here. 08/11/2021 upon evaluation today patient's wound on the heel actually showing signs of excellent improvement. I am actually very pleased with where things stand currently. No fevers, chills, nausea, vomiting, or diarrhea. There does not appear to be any need for sharp debridement today either which is also great news. 08/25/2021 upon evaluation today patient appears to be doing well with regard to his heel ulcer. This is actually looking significantly improved compared to last time I saw him. Fortunately there does not appear to be any evidence of active infection at this time. No fevers, chills, nausea, vomiting, or diarrhea. 09/01/2021 upon evaluation today patient appears to be doing decently well in regard to his wounds. Fortunately there does not appear to be any signs of active infection at this time which is great news and overall very pleased in that regard. I do not see any evidence of active infection systemically which is great  news as well. No fevers, chills, nausea, vomiting, or diarrhea. I think that the heel is making excellent progress. 09/15/2021 upon evaluation today patient's heel unfortunately is significantly worse compared to what it was previous. I do believe that at this time he would benefit from switching back to something a little bit more offloading from the cushion shoe he has right now this is more of a heel protector what I really need is a Prevalon offloading boot which I think is good to do a lot better for him than just the small heel protector. 09/23/2021 upon evaluation today patient appears to be doing a little better in regards to last week's visit. Overall I think that he is making good progress which is great news and there does not appear to be any evidence of active infection at this time. No  fevers, chills, nausea, vomiting, or diarrhea. 09/30/2021 upon evaluation patient's heel is actually showing signs of good improvement which is great news. Fortunately there does not appear to be any evidence of active infection locally nor systemically at this point. No fevers, chills, nausea, vomiting, or diarrhea. Electronic Signature(s) Signed: 09/30/2021 5:12:45 PM By: Worthy Keeler PA-C Entered By: Worthy Keeler on 09/30/2021 17:12:45 DIVIT, STIPP ([id number]) -------------------------------------------------------------------------------- Physical Exam Details Patient Name: PIERCE, BAROCIO Date of Service: 09/30/2021 10:30 AM Medical Record Number: [id number] Patient Account Number: [id number] Date of Birth/Sex: March 30, 1946 (75 y.o. M) Treating RN: [name] Primary Care Provider: Fulton Reek Other Clinician: Referring Provider: Fulton Reek Treating Provider/Extender: Skipper Cliche in Treatment: 20 Constitutional Well-nourished and well-hydrated in no acute distress. Respiratory normal breathing without difficulty. Psychiatric this patient is able to make decisions  and demonstrates good insight into disease process. Alert and Oriented x 3. pleasant and cooperative. Notes Patient's wound bed did require some sharp debridement to clear away some of the necrotic debris he tolerated this debridement today without complication and postdebridement wound bed appears to be doing significantly better which is great news. Electronic Signature(s) Signed: 09/30/2021 5:13:19 PM By: Worthy Keeler PA-C Entered By: Worthy Keeler on 09/30/2021 17:13:18 [name] ([id number]) -------------------------------------------------------------------------------- Physician Orders Details Patient Name: JERICK, KHACHATRYAN. Date of Service: 09/30/2021 10:30 AM Medical Record Number: [id number] Patient Account Number: [id number] Date of Birth/Sex: 16-Dec-1945 (75 y.o. 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 o Nurse Visit as needed - call to schedule Bathing/ Shower/ Hygiene o May shower with wound dressing protected with water repellent cover or cast protector. o No tub bath. Edema Control - Lymphedema / Segmental Compressive Device / Other o Patient to wear own compression stockings. Remove compression stockings every night before going to bed and put on every morning when getting up. - right leg o Elevate leg(s) parallel to the floor when sitting. o DO YOUR BEST to sleep in the bed at night. DO NOT sleep in your recliner. Long hours of sitting in a recliner leads to swelling of the  legs and/or potential wounds on your backside. Off-Loading o Other: - L heel offloader shoe Keep pressure off of heel in the bed-float heel in [place] order from [employer], see sheet Additional Orders / Instructions o Follow Nutritious Diet and Increase Protein Intake - check blood sugar and eat protein for wound healing Wound Treatment Wound #9 - Calcaneus Wound Laterality: Left Cleanser: Normal Saline Every Other Day/15 Days Discharge Instructions: Wash your hands with soap and water. Remove old dressing, discard into plastic bag and place into trash. Cleanse the wound with Normal Saline prior to applying a clean dressing using gauze sponges, not tissues or cotton balls. Do not scrub or use excessive force. Pat dry using gauze sponges, not tissue or cotton balls. Primary Dressing: Silvercel Small 2x2 (in/in) (Generic) Every Other Day/15 Days Discharge Instructions: Apply Silvercel Small 2x2 (in/in) as instructed Secondary Dressing: ABD Pad 5x9 (in/in) Every Other Day/15 Days Discharge Instructions: Cover with ABD pad Secondary Dressing: heel cup Every Other Day/15 Days  Discharge Instructions: apply over dressing to protect heel Secured With: 6M Medipore H Soft Cloth Surgical Tape, 2x2 (in/yd) (Generic) Every Other Day/15 Days Secured With: Coban Cohesive Bandage 4x5 (yds) Stretched Every Other Day/15 Days Discharge Instructions: Apply Coban as directed. Secured With: [employer] or Non-Sterile 6-ply 4.5x4 (yd/yd) (Generic) Every Other Day/15 Days Discharge Instructions: Apply Kerlix as directed Add-Ons: Prevalon boot Every Other Day/15 Days Discharge Instructions: use while in bed ANTHONIE, LOTITO ([id number]) Electronic Signature(s) Signed: 09/30/2021 5:37:18 PM By: Worthy Keeler PA-C Signed: 10/01/2021 4:05:40 PM By: Levora Dredge Entered By: Levora Dredge on 09/30/2021 11:36:12 BEJAMIN, HACKBART  ([id number]) -------------------------------------------------------------------------------- Problem List Details Patient Name: BAILEY, KOLBE. Date of Service: 09/30/2021 10:30 AM Medical Record Number: [id number] Patient Account Number: [id number] Date of Birth/Sex: 1945/11/28 (75 y.o. 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 Date of Service: 09/30/2021 10:30 AM Medical Record Number: [id number] Patient Account Number: [id number] Date of Birth/Sex: 1946-05-13 (75 y.o. M) Treating RN: [name] Primary Care Provider: Fulton Reek Other Clinician: Referring Provider: Fulton Reek Treating Provider/Extender: Skipper Cliche in Treatment: 9 Subjective Chief Complaint Information obtained from Patient Right LE ulcers and cellulitis History of Present Illness (HPI) 09/24/2020 on evaluation today patient presents today for a heel ulcer that he tells me has been present for about 2 years. He has been seeing podiatry and they have been attempting to manage this including what sounds to be a total contact cast, Unna boot, and just standard dressings otherwise as well. Most recently has  been using triple antibiotic ointment. With that being said unfortunately despite everything he really has not had any significant improvement. He tells me that he cannot even really remember exactly how this began but he presumed it may have rubbed on his shoes or something of that nature. 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 He tells me that this does seem to help. Fortunately there is no signs of active infection at this time. His most recent hemoglobin A1c was 8.1 though he knows this was this year he cannot tell me the exact time. His fluid pills currently to help with some of the lower extremity edema although he does obviously have signs of venous stasis/lymphedema. Currently there is no evidence of active infection. No fevers, chills, nausea, vomiting, or diarrhea. Patient has had fairly recent ABIs which were performed on 07/19/2020 and revealed that he has normal findings in both the ankle and toe locations bilaterally. His ABI on the right was 1.09 on the left was 1.08 with a TBI on the right of 0.88 and on the left of 0.94.  Triphasic flow was noted throughout. 10/08/2020 on evaluation today patient appears to be doing pretty well in regard to his left heel currently in fact this is doing a great job and seems to be healing quite nicely. Unfortunately on his right leg he had a pile of wood that actually fell on him injuring his right leg this is somewhat erythematous has me concerned little bit about cellulitis though there is not really a good area to culture at this point. 10/24/2020 upon evaluation today patient appears to be doing well with regard to his heel ulcer. He is showing signs of improvement which  is great news. His right leg is completely healed. Overall I feel like he is doing excellent and there is no signs of infection. 11/07/2020 upon evaluation today patient appears to be doing well with regard to his heel ulcer. He tells me that last week when he was unable to come in his wife actually thought that the wound was very close to closing if not closed. Then it began to "reopen again". I really feel like what may have happened as the collagen may have dried over the wound bed and that because that misunderstanding with thinking that the wound was healing. With that being said I did not see it last week I do not know that for certain. Either way I feel like he is doing great today I see no signs of infection at this point. 11/26/2020 upon inspection today patient appears to be doing decently well in regard to his heel ulcer. He has been tolerating the dressing changes without complication. Fortunately there is no sign of active infection at this time. No fevers, chills, nausea, vomiting, or diarrhea. 12/10/2020 upon evaluation today patient appears to be doing fairly well in regard to the wound on his heel as well as what appears to be a new wound of the left first metatarsal head plantar aspect. This seems to be an area that was callus that has split as the patient tells me has been trying to walk on his toes more has probably where this came from. With that being said there does not appear to be signs of active infection which is great news. 12/17/2020 upon evaluation today patient actually appears to be making good progress currently. Fortunately there is no evidence of active infection at this time. Overall I feel like he is very close to complete closure. 12/26/2020 upon evaluation today patient appears to be doing excellent in regard to his wounds. In fact I am not certain that these are not even completely healed on initial inspection. Overall I am very pleased with where things stand today.  Good news is that they are healed he is actually get ready to go out of town and that will be helpful as well as he will be a full part of the time. Readmission: 02/04/2021 upon evaluation today patient appears to be doing well at this point in regard to his left heel that I previously saw him for. Unfortunately he is having issues with his right lower extremity. He has significant wounds at this point he also has erythema noted there is definite signs of cellulitis which is unfortunate. With that being said I think we do need to address this sooner rather than later. The good news is he did have a nice trip to the beach. He tells me that he had no issues during that time. 4/27; patient on Bactrim. Culture showed methicillin sensitive staph aureus therefore the Bactrim should  be effective. 2 small areas on the right leg are healed the area on the mid aspect of the tibia almost 100% covered in a very adherent necrotic debris. We have been using silver alginate 02/20/2021 upon evaluation today patient appears to be doing well with regard to his wound. He is showing signs of improvement which is great news overall very pleased with where things stand today. No fevers, chills, nausea, vomiting, or diarrhea. 02/27/2021 upon evaluation today patient appears to be doing well with regard to his leg ulcer. He is tolerating the dressing changes and overall appears to be doing quite excellent. I am extremely pleased with where things stand and overall I think patient is making great progress. There is no sign of active infection at this time which is also great news. 03/06/2021 upon evaluation today patient appears to be doing excellent in regard to his wounds. In fact he appears to be completely healed today based on what I am seeing. This is excellent news and overall I am extremely pleased with where he stands. Overall the patient is happy to hear ORSON, RHO ([id number]) this as well this has been quite  sometime coming. Readmission: 04/01/2021 patient unfortunately returns for readmission today. He tells me that he has been wearing his compression socks on the right leg daily and he does not really know what is going on and why his legs are doing what they are doing. We did therefore go ahead and probe deeper into exactly what has been going on with him today. Subsequently the patient tells me that when he gets up and what we would call "first thing in the morning" are really 2 different things". He does not tend to sleep well so he tells me that he will often wake up at 3:00 in the morning. He will then potentially going into the living room to get in his chair where he may read a book for a little while and then potentially fall asleep back in his chair. He then subsequently wake up around 5:00 or so and then get up and in his words "putter around". This often will end with him proceeding at some point around 8 AM or 9 AM to putting on his compression socks. With that being said this means that anywhere from the 3:00 in the morning till roughly around 9:00 in the morning he has no compression on yet he is sitting in his recliner, walking around and up and about, and this is at least 5 to 6 hours of noncompressed time. That may be our issue here but I did not realize until we discussed this further today. Fortunately there does not appear to be any signs of active infection at this time which is great news. With that being said he has multiple wounds of the bilateral lower extremities. 04/10/2021 upon evaluation today patient appears to be doing well with regard to his legs for the most part. He does look like he had some injury where his wrap slid down nonetheless he did some "doctoring on them". I do feel like most of the areas honestly have cleared back up which is great news. There does not appear to be any signs of active infection at this time which is also great news. No fevers, chills, nausea,  vomiting, or diarrhea. 04/18/2021 upon evaluation today patient appears to be doing well with regard to his wounds in fact he appears to be completely healed which is great news. Fortunately there does not appear  to be any signs of active infection which is great news. No fevers, chills, nausea, vomiting, or diarrhea. READMISSION 07/28/2021 This is a now 75 year old man we have had in this clinic for quite a bit of this year. 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 He wears compression stockings. 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 He comes in an old running shoes although he says he wears better shoes at home. His history is that he noticed a blister in the right posterior heel. This open. He has been applying Neosporin to it currently the wound measures 2 x 1.4 cm. 100% slough covered. Not really offloading this in any rigorous way. Past medical history is essentially unchanged he has paroxysmal atrial fibrillation on Coumadin type 2 diabetes with a recent hemoglobin A1c of 7 on metformin. ABI in our clinic was 0.98 on the right 08/05/2021 upon evaluation today patient appears to be doing well with regard to his heel ulcer. Fortunately there does not appear to be any signs of active infection at this time. Overall I been very pleased with where things seem to be currently as far as the wound healing is concerned. Again this is first a lot of seeing him Dr. Dellia Nims readmitted him last week. Nonetheless I think we are definitely making some progress here. 08/11/2021 upon evaluation today patient's wound on the heel actually showing signs of excellent improvement. I am actually very pleased with where things stand currently. No fevers, chills, nausea, vomiting, or diarrhea. There does not appear to be any need for sharp debridement today either which is also great news. 08/25/2021  upon evaluation today patient appears to be doing well with regard to his heel ulcer. This is actually looking significantly improved compared to last time I saw him. Fortunately there does not appear to be any evidence of active infection at this time. No fevers, chills, nausea, vomiting, or diarrhea. 09/01/2021 upon evaluation today patient appears to be doing decently well in regard to his wounds. Fortunately there does not appear to be any signs of active infection at this time which is great news and overall very pleased in that regard. I do not see any evidence of active infection systemically which is great news as well. No fevers, chills, nausea, vomiting, or diarrhea. I think that the heel is making excellent progress. 09/15/2021 upon evaluation today patient's heel unfortunately is significantly worse compared to what it was previous. I do believe that at this time he would benefit from switching back to something a little bit more offloading from the cushion shoe he has right now this is more of a heel protector what I really need is a Prevalon offloading boot which I think is good to do a lot better for him than just the small heel protector. 09/23/2021 upon evaluation today patient appears to be doing a little better in regards to last week's visit. Overall I think that he is making good progress which is great news and there does not appear to be any evidence of active infection at this time. No fevers, chills, nausea, vomiting, or diarrhea. 09/30/2021 upon evaluation patient's heel is actually showing signs of good improvement which is great news. Fortunately there does not appear to be any evidence of active infection locally nor systemically at this point. No fevers, chills, nausea, vomiting, or diarrhea. Objective Constitutional RAYDELL, MANERS ([id number]) Well-nourished and well-hydrated in no acute  distress. Vitals Time Taken: 10:45 AM, Height: 69 in, Weight: 267 lbs, BMI:  39.4, Temperature: 98 F, Pulse: 69 bpm, Respiratory Rate: 18 breaths/min, Blood Pressure: 152/71 mmHg. Respiratory normal breathing without difficulty. Psychiatric this patient is able to make decisions and demonstrates good insight into disease process. Alert and Oriented x 3. pleasant and cooperative. General Notes: Patient's wound bed did require some sharp debridement to clear away some of the necrotic debris he tolerated this debridement today without complication and postdebridement wound bed appears to be doing significantly better which is great news. Integumentary (Hair, Skin) Wound #9 status is Open. Original cause of wound was Gradually Appeared. The date acquired was: 07/07/2021. The wound has been in treatment 9 weeks. The wound is located on the Left Calcaneus. The wound measures 2.3cm length x 2.7cm width x 0.1cm depth; 4.877cm^2 area and 0.488cm^3 volume. There is Fat Layer (Subcutaneous Tissue) exposed. There is no tunneling or undermining noted. There is a large amount of serous drainage noted. The wound margin is thickened. There is medium (34-66%) red, pink granulation within the wound bed. There is a medium (34-66%) amount of necrotic tissue within the wound bed including Adherent Slough. 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 There was a Excisional Skin/Subcutaneous Tissue Debridement with a total area of 6.21 sq cm performed by Tommie Sams., PA-C. With the following instrument(s): Curette to remove Viable and Non-Viable tissue/material. Material removed includes Callus, Subcutaneous Tissue, Slough, and  Biofilm. No specimens were taken. A time out was conducted at 11:21, prior to the start of the procedure. A Minimum amount of bleeding was controlled with Pressure. The procedure was tolerated well. Post Debridement Measurements: 2.3cm length x 2.7cm width x 0.1cm depth; 0.488cm^3 volume. Character of Wound/Ulcer Post Debridement is stable. Severity of Tissue Post Debridement is: Fat layer exposed. Post procedure Diagnosis Wound #9: Same as Pre-Procedure Plan Follow-up Appointments: Return Appointment in 1 week. Nurse Visit as needed - call to schedule Bathing/ Shower/ Hygiene: May shower with wound dressing protected with water repellent cover or cast protector. No tub bath. Edema Control - Lymphedema / Segmental Compressive Device / Other: Patient to wear own compression stockings. Remove compression stockings every night before going to bed and put on every morning when getting up. - right leg Elevate leg(s) parallel to the floor when sitting. DO YOUR BEST to sleep in the bed at night. DO NOT sleep in your recliner. Long hours of sitting in a recliner leads to swelling of the legs and/or potential wounds on your backside. Off-Loading: Other: - L heel offloader shoe Keep pressure off of heel in the bed-float heel in [place] order from [employer], see sheet Additional Orders / Instructions: Follow Nutritious Diet and Increase Protein Intake - check blood sugar and eat protein for wound healing NIKO, PENSON ([id number]) WOUND #9: - Calcaneus Wound Laterality: Left Cleanser: Normal Saline Every Other Day/15 Days Discharge Instructions: Wash your hands with soap and water. Remove old dressing, discard into plastic bag and place into trash. Cleanse the wound with Normal Saline prior to applying a clean dressing using gauze sponges, not tissues or cotton balls. Do not scrub or use excessive force. [place]  dry using gauze sponges, not tissue or cotton balls. Primary Dressing: Silvercel  Small 2x2 (in/in) (Generic) Every Other Day/15 Days Discharge Instructions: Apply Silvercel Small 2x2 (in/in) as instructed Secondary Dressing: ABD Pad 5x9 (in/in) Every Other Day/15 Days Discharge Instructions: Cover with ABD pad Secondary Dressing: heel cup Every Other Day/15 Days Discharge Instructions: apply over dressing to protect heel Secured With: 10M Medipore H Soft Cloth Surgical Tape, 2x2 (in/yd) (Generic) Every Other Day/15 Days Secured With: Coban Cohesive Bandage 4x5 (yds) Stretched Every Other Day/15 Days Discharge Instructions: Apply Coban as directed. Secured With: [employer] or Non-Sterile 6-ply 4.5x4 (yd/yd) (Generic) Every Other Day/15 Days Discharge Instructions: Apply Kerlix as directed Add-Ons: Prevalon boot Every Other Day/15 Days Discharge Instructions: use while in bed 1. I am good recommend currently that we going to continue with the wound care measures as before and the patient is in agreement with plan. This includes the use of the silver alginate dressing which I think is doing a great job. We will follow this with an ABD pad and heel cup followed by roll gauze. 2. I am also can recommend that we have the patient continue with changing this every other day I think that skin to be appropriate. 3. I am also can recommend that he continue to try to keep pressure off is much as possible. We will see patient back for reevaluation in 1 week here in the clinic. If anything worsens or changes patient will contact our office for additional recommendations. Electronic Signature(s) Signed: 09/30/2021 5:14:04 PM By: Worthy Keeler PA-C Entered By: Worthy Keeler on 09/30/2021 17:14:04 [name] ([id number]) -------------------------------------------------------------------------------- SuperBill Details Patient Name: ARVAL, BRANDSTETTER. Date of Service: 09/30/2021 Medical Record Number: [id number] Patient Account Number: [id number] Date of Birth/Sex:  07/24/1946 (75 y.o. M) Treating RN: Donnamarie Poag Primary Care Provider: Fulton Reek Other Clinician: Referring Provider: Fulton Reek Treating Provider/Extender: Skipper Cliche in Treatment: 9 Diagnosis Coding ICD-10 Codes Code Description E11.621 Type 2 diabetes mellitus with foot ulcer L97.421 Non-pressure chronic ulcer of left heel and midfoot limited to breakdown of skin E11.42 Type 2 diabetes mellitus with diabetic polyneuropathy Facility Procedures CPT4 Code: [id number] Description: 36144 - DEB SUBQ TISSUE 20 SQ CM/< Modifier: Quantity: 1 CPT4 Code: Description: ICD-10 Diagnosis Description L97.421 Non-pressure chronic ulcer of left heel and midfoot limited to breakdow Modifier: n of skin Quantity: Physician Procedures CPT4 Code: 3154008 Description: 11042 - WC PHYS SUBQ TISS 20 SQ CM Modifier: Quantity: 1 CPT4 Code: Description: ICD-10 Diagnosis Description L97.421 Non-pressure chronic ulcer of left heel and midfoot limited to breakdow Modifier: n of skin Quantity: Electronic Signature(s) Signed: 09/30/2021 5:14:12 PM By: Worthy Keeler PA-C Previous Signature: 09/30/2021 4:31:04 PM Version By: Donnamarie Poag Entered By: Worthy Keeler on 09/30/2021 17:14:12

## 2021-09-30 NOTE — Progress Notes (Addendum)
RENA, SWEEDEN ([id number]) Visit Report for 09/30/2021 Arrival Information Details Patient Name: [name], [name]. Date of Service: 09/30/2021 10:30 AM Medical Record Number: [id number] Patient Account Number: [id number] Date of Birth/Sex: 1945/12/15 (75 y.o. M) Treating RN: Levora Dredge Primary Care Ellyn Rubiano: Fulton Reek Other Clinician: Referring Pieter Fooks: Fulton Reek Treating Cem Kosman/Extender: Skipper Cliche in Treatment: 9 Visit Information History Since Last Visit Added or deleted any medications: No Patient Arrived: [name] Any new allergies or adverse reactions: No Arrival Time: 10:44 Had a fall or experienced change in No Accompanied By: self activities of daily living that may affect Transfer Assistance: None risk of falls: Patient Identification Verified: Yes Hospitalized since last visit: No Secondary Verification Process Completed: Yes Has Dressing in Place as Prescribed: Yes Patient Has Alerts: Yes Has Footwear/Offloading in Place as Prescribed: Yes Patient Alerts: Patient on Blood Thinner Left: Felt/Foam Coumadin Pain Present Now: No Diabetic aspirin 54m Electronic Signature(s) Signed: 10/01/2021 4:05:40 PM By: GLevora DredgeEntered By: GLevora Dredgeon 09/30/2021 10:45:37 [name]([id number] -------------------------------------------------------------------------------- Clinic Level of Care Assessment Details Patient Name: [name] Date of Service: 09/30/2021 10:30 AM Medical Record Number: [id number] Account Number: [id number] of Birth/Sex: 12/19/1945/09/05(75y.o. M) Treating RN: GLevora DredgePrimary Care Harlee Pursifull: SFulton ReekOther Clinician: Referring Jamiria Langill: SFulton ReekTreating Anitra Doxtater/Extender: SSkipper Clichein Treatment: 9 Clinic Level of Care Assessment Items TOOL 1 Quantity Score '[]'  - Use when EandM and Procedure is performed on INITIAL visit 0 ASSESSMENTS - Nursing  Assessment / Reassessment '[]'  - General Physical Exam (combine w/ comprehensive assessment (listed just below) when performed on new 0 pt. evals) '[]'  - 0 Comprehensive Assessment (HX, ROS, Risk Assessments, Wounds Hx, etc.) ASSESSMENTS - Wound and Skin Assessment / Reassessment '[]'  - Dermatologic / Skin Assessment (not related to wound area) 0 ASSESSMENTS - Ostomy and/or Continence Assessment and Care '[]'  - Incontinence Assessment and Management 0 '[]'  - 0 Ostomy Care Assessment and Management (repouching, etc.) PROCESS - Coordination of Care '[]'  - Simple Patient / Family Education for ongoing care 0 '[]'  - 0 Complex (extensive) Patient / Family Education for ongoing care '[]'  - 0 Staff obtains CProgrammer, systems Records, Test Results / Process Orders '[]'  - 0 Staff telephones HHA, Nursing Homes / Clarify orders / etc '[]'  - 0 Routine Transfer to another Facility (non-emergent condition) '[]'  - 0 Routine Hospital Admission (non-emergent condition) '[]'  - 0 New Admissions / IBiomedical engineer/ Ordering NPWT, Apligraf, etc. '[]'  - 0 Emergency Hospital Admission (emergent condition) PROCESS - Special Needs '[]'  - Pediatric / Minor Patient Management 0 '[]'  - 0 Isolation Patient Management '[]'  - 0 Hearing / Language / Visual special needs '[]'  - 0 Assessment of Community assistance (transportation, D/C planning, etc.) '[]'  - 0 Additional assistance / Altered mentation '[]'  - 0 Support Surface(s) Assessment (bed, cushion, seat, etc.) INTERVENTIONS - Miscellaneous '[]'  - External ear exam 0 '[]'  - 0 Patient Transfer (multiple staff / HCivil Service fast streamer/ Similar devices) '[]'  - 0 Simple Staple / Suture removal (25 or less) '[]'  - 0 Complex Staple / Suture removal (26 or more) '[]'  - 0 Hypo/Hyperglycemic Management (do not check if billed separately) '[]'  - 0 Ankle / Brachial Index (ABI) - do not check if billed separately Has the patient been seen at the hospital within the last three years: Yes Total Score: 0 Level Of  Care: ____ [name]([id number] Electronic Signature(s) Signed: 10/01/2021 4:05:40 PM By: GLevora DredgeEntered By: GLevora Dredgeon 09/30/2021 11:36:56  [name], [name] ([id number]) -------------------------------------------------------------------------------- Encounter Discharge Information Details Patient Name: [name], [name]. Date of Service: 09/30/2021 10:30 AM Medical Record Number: [id number] Patient Account Number: [id number] Date of Birth/Sex: 1946/09/10 (75 y.o. M) Treating RN: Levora Dredge Primary Care Shakeeta Godette: Fulton Reek Other Clinician: Referring Lyllie Cobbins: Fulton Reek Treating Danali Marinos/Extender: Skipper Cliche in Treatment: 9 Encounter Discharge Information Items Post Procedure Vitals Discharge Condition: Stable Temperature (F): 98 Ambulatory Status: Ambulatory Pulse (bpm): 69 Discharge Destination: Home Respiratory Rate (breaths/min): 18 Transportation: Private Auto Blood Pressure (mmHg): 152/71 Accompanied By: self Schedule Follow-up Appointment: Yes Clinical Summary of Care: Patient Declined Electronic Signature(s) Signed: 10/01/2021 4:05:40 PM By: Levora Dredge Entered By: Levora Dredge on 09/30/2021 11:38:06 [name] ([id number]) -------------------------------------------------------------------------------- Lower Extremity Assessment Details Patient Name: [name], [name]. Date of Service: 09/30/2021 10:30 AM Medical Record Number: [id number] Patient Account Number: [id number] Date of Birth/Sex: 22-Sep-1946 (75 y.o. M) Treating RN: Levora Dredge Primary Care Vilas Edgerly: Fulton Reek Other Clinician: Referring Norberta Stobaugh: Fulton Reek Treating Florabelle Cardin/Extender: Jeri Cos Weeks in Treatment: 9 Edema Assessment Assessed: [Left: No] [Right: No] Edema: [Left: N] [Right: o] Vascular Assessment Pulses: Dorsalis Pedis Palpable: [Left:Yes] Electronic Signature(s) Signed: 10/01/2021 4:05:40 PM By:  Levora Dredge Entered By: Levora Dredge on 09/30/2021 10:58:37 [name] ([id number]) -------------------------------------------------------------------------------- Multi Wound Chart Details Patient Name: [name], [name]. Date of Service: 09/30/2021 10:30 AM Medical Record Number: [id number] Patient Account Number: [id number] Date of Birth/Sex: Oct 12, 1946 (75 y.o. M) Treating RN: Levora Dredge Primary Care Destiney Sanabia: Fulton Reek Other Clinician: Referring Rashada Klontz: Fulton Reek Treating Haide Klinker/Extender: Skipper Cliche in Treatment: 9 Vital Signs Height(in): 42 Pulse(bpm): 6 Weight(lbs): 62 Blood Pressure(mmHg): 152/71 Body Mass Index(BMI): 39 Temperature(F): 98 Respiratory Rate(breaths/min): 18 Photos: [N/A:N/A] Wound Location: Left Calcaneus N/A N/A Wounding Event: Gradually Appeared N/A N/A Primary Etiology: Diabetic Wound/Ulcer of the Lower N/A N/A Extremity Comorbid History: Cataracts, Arrhythmia, Coronary N/A N/A Artery Disease, Hypertension, Type II Diabetes, Osteoarthritis, Neuropathy Date Acquired: 07/07/2021 N/A N/A Weeks of Treatment: 9 N/A N/A Wound Status: Open N/A N/A Measurements L x W x D (cm) 2.3x2.7x0.1 N/A N/A Area (cm) : 4.877 N/A N/A Volume (cm) : 0.488 N/A N/A % Reduction in Area: -121.80% N/A N/A % Reduction in Volume: -121.80% N/A N/A Classification: Grade 2 N/A N/A Exudate Amount: Large N/A N/A Exudate Type: Serous N/A N/A Exudate Color: amber N/A N/A Wound Margin: Thickened N/A N/A Granulation Amount: Medium (34-66%) N/A N/A Granulation Quality: Red, Pink N/A N/A Necrotic Amount: Medium (34-66%) N/A N/A Exposed Structures: Fat Layer (Subcutaneous Tissue): N/A N/A Yes Fascia: No Tendon: No Muscle: No Joint: No Bone: No Epithelialization: Small (1-33%) N/A N/A Treatment Notes Electronic Signature(s) Signed: 10/01/2021 4:05:40 PM By: Levora Dredge Entered By: Levora Dredge on 09/30/2021  11:19:36 [name] ([id number]) -------------------------------------------------------------------------------- Multi-Disciplinary Care Plan Details Patient Name: [name], [name]. Date of Service: 09/30/2021 10:30 AM Medical Record Number: [id number] Patient Account Number: [id number] Date of Birth/Sex: 1946-02-05 (75 y.o. M) Treating RN: Levora Dredge Primary Care Ekansh Sherk: Fulton Reek Other Clinician: Referring Camille Dragan: Fulton Reek Treating Hiilei Gerst/Extender: Skipper Cliche in Treatment: 9 Active Inactive Wound/Skin Impairment Nursing Diagnoses: Impaired tissue integrity Knowledge deficit related to smoking impact on wound healing Knowledge deficit related to ulceration/compromised skin integrity Goals: Patient/caregiver will verbalize understanding of skin care regimen Date Initiated: 07/28/2021 Date Inactivated: 08/11/2021 Target Resolution Date: 08/08/2021 Goal Status: Met Ulcer/skin breakdown will have a volume reduction of 30% by week 4 Date Initiated: 07/28/2021 Target Resolution Date: 08/28/2021 Goal Status: Active Ulcer/skin breakdown will have  a volume reduction of 50% by week 8 Date Initiated: 07/28/2021 Target Resolution Date: 09/27/2021 Goal Status: Active Ulcer/skin breakdown will have a volume reduction of 80% by week 12 Date Initiated: 07/28/2021 Target Resolution Date: 10/28/2020 Goal Status: Active Ulcer/skin breakdown will heal within 14 weeks Date Initiated: 07/28/2021 Target Resolution Date: 11/12/2020 Goal Status: Active Interventions: Assess patient/caregiver ability to obtain necessary supplies Assess patient/caregiver ability to perform ulcer/skin care regimen upon admission and as needed Assess ulceration(s) every visit Notes: Electronic Signature(s) Signed: 10/01/2021 4:05:40 PM By: Levora Dredge Entered By: Levora Dredge on 09/30/2021 11:19:16 [name]  ([id number]) -------------------------------------------------------------------------------- Pain Assessment Details Patient Name: [name], [name]. Date of Service: 09/30/2021 10:30 AM Medical Record Number: [id number] Patient Account Number: [id number] Date of Birth/Sex: May 30, 1946 (75 y.o. M) Treating RN: Levora Dredge Primary Care Sudeep Scheibel: Fulton Reek Other Clinician: Referring Jaliyah Fotheringham: Fulton Reek Treating Osmany Azer/Extender: Skipper Cliche in Treatment: 9 Active Problems Location of Pain Severity and Description of Pain Patient Has Paino No Site Locations Rate the pain. Current Pain Level: 0 Pain Management and Medication Current Pain Management: Electronic Signature(s) Signed: 10/01/2021 4:05:40 PM By: Levora Dredge Entered By: Levora Dredge on 09/30/2021 10:46:16 [name] ([id number]) -------------------------------------------------------------------------------- Patient/Caregiver Education Details Patient Name: [name], [name]. Date of Service: 09/30/2021 10:30 AM Medical Record Number: [id number] Patient Account Number: [id number] Date of Birth/Gender: December 20, 1945 (75 y.o. M) Treating RN: Levora Dredge Primary Care Physician: Fulton Reek Other Clinician: Referring Physician: Fulton Reek Treating Physician/Extender: Skipper Cliche in Treatment: 9 Education Assessment Education Provided To: Patient Education Topics Provided Wound/Skin Impairment: Handouts: Caring for Your Ulcer Methods: Explain/Verbal Responses: State content correctly Electronic Signature(s) Signed: 10/01/2021 2:22:35 PM By: Donnamarie Poag Entered By: Donnamarie Poag on 09/30/2021 16:31:10 [name] ([id number]) -------------------------------------------------------------------------------- Wound Assessment Details Patient Name: [name], [name]. Date of Service: 09/30/2021 10:30 AM Medical Record Number: [id number] Patient Account Number:  [id number] Date of Birth/Sex: Jan 06, 1946 (75 y.o. M) Treating RN: Levora Dredge Primary Care Shanyce Daris: Fulton Reek Other Clinician: Referring Gargi Berch: Fulton Reek Treating Zygmund Passero/Extender: Skipper Cliche in Treatment: 9 Wound Status Wound Number: 9 Primary Diabetic Wound/Ulcer of the Lower Extremity Etiology: Wound Location: Left Calcaneus Wound Open Wounding Event: Gradually Appeared Status: Date Acquired: 07/07/2021 Comorbid Cataracts, Arrhythmia, Coronary Artery Disease, Weeks Of Treatment: 9 History: Hypertension, Type II Diabetes, Osteoarthritis, Clustered Wound: No Neuropathy Photos Wound Measurements Length: (cm) 2.3 Width: (cm) 2.7 Depth: (cm) 0.1 Area: (cm) 4.877 Volume: (cm) 0.488 % Reduction in Area: -121.8% % Reduction in Volume: -121.8% Epithelialization: Small (1-33%) Tunneling: No Undermining: No Wound Description Classification: Grade 2 Wound Margin: Thickened Exudate Amount: Large Exudate Type: Serous Exudate Color: amber Foul Odor After Cleansing: No Slough/Fibrino Yes Wound Bed Granulation Amount: Medium (34-66%) Exposed Structure Granulation Quality: Red, Pink Fascia Exposed: No Necrotic Amount: Medium (34-66%) Fat Layer (Subcutaneous Tissue) Exposed: Yes Necrotic Quality: Adherent Slough Tendon Exposed: No Muscle Exposed: No Joint Exposed: No Bone Exposed: No Treatment Notes Wound #9 (Calcaneus) Wound Laterality: Left Cleanser Normal Saline Discharge Instruction: Wash your hands with soap and water. Remove old dressing, discard into plastic bag and place into trash. Cleanse the wound with Normal Saline prior to applying a clean dressing using gauze sponges, not tissues or cotton balls. Do not WEBER, MONNIER ([id number]) scrub or use excessive force. Pat dry using gauze sponges, not tissue or cotton balls. Peri-Wound Care Topical Primary Dressing Silvercel Small 2x2 (in/in) Discharge Instruction: Apply Silvercel  Small 2x2 (in/in) as instructed Secondary Dressing ABD Pad 5x9 (in/in)  Discharge Instruction: Cover with ABD pad heel cup Discharge Instruction: apply over dressing to protect heel Secured With 55M Medipore H Soft Cloth Surgical Tape, 2x2 (in/yd) Coban Cohesive Bandage 4x5 (yds) Stretched Discharge Instruction: Apply Coban as directed. Kerlix Roll Sterile or Non-Sterile 6-ply 4.5x4 (yd/yd) Discharge Instruction: Apply Kerlix as directed Compression Wrap Compression Stockings Add-Ons Prevalon boot Discharge Instruction: use while in bed Electronic Signature(s) Signed: 10/01/2021 4:05:40 PM By: Levora Dredge Entered By: Levora Dredge on 09/30/2021 10:57:04 [name] ([id number]) -------------------------------------------------------------------------------- Vitals Details Patient Name: [name], [name]. Date of Service: 09/30/2021 10:30 AM Medical Record Number: [id number] Patient Account Number: [id number] Date of Birth/Sex: 1946/05/23 (76 y.o. M) Treating RN: Levora Dredge Primary Care Knut Rondinelli: Fulton Reek Other Clinician: Referring Raymund Manrique: Fulton Reek Treating Cray Monnin/Extender: Skipper Cliche in Treatment: 9 Vital Signs Time Taken: 10:45 Temperature (F): 98 Height (in): 69 Pulse (bpm): 69 Weight (lbs): 267 Respiratory Rate (breaths/min): 18 Body Mass Index (BMI): 39.4 Blood Pressure (mmHg): 152/71 Reference Range: 80 - 120 mg / dl Electronic Signature(s) Signed: 10/01/2021 4:05:40 PM By: Levora Dredge Entered By: Levora Dredge on 09/30/2021 10:46:08

## 2021-10-07 ENCOUNTER — Encounter: Payer: Medicare HMO | Admitting: Physician Assistant

## 2021-10-07 ENCOUNTER — Other Ambulatory Visit: Payer: Self-pay

## 2021-10-07 DIAGNOSIS — E11621 Type 2 diabetes mellitus with foot ulcer: Secondary | ICD-10-CM | POA: Diagnosis not present

## 2021-10-07 NOTE — Progress Notes (Addendum)
[name] ([id number]) Visit Report for 10/07/2021 Arrival Information Details Patient Name: [name], [name]. Date of Service: 10/07/2021 10:30 AM Medical Record Number: [id number] Patient Account Number: [id number] Date of Birth/Sex: 05-14-1946 (75 y.o. M) Treating RN: [name] Primary Care [name]: [name] Other Clinician: Referring [name]: [name] Treating [name]/Extender: [name] in Treatment: 10 Visit Information History Since Last Visit Added or deleted any medications: No Patient Arrived: [name] Any new allergies or adverse reactions: No Arrival Time: 10:36 Had a fall or experienced change in No Accompanied By: self activities of daily living that may affect Transfer Assistance: None risk of falls: Patient Identification Verified: Yes Hospitalized since last visit: No Secondary Verification Process Completed: Yes Has Dressing in Place as Prescribed: Yes Patient Has Alerts: Yes Has Footwear/Offloading in Place as Prescribed: Yes Patient Alerts: Patient on Blood Thinner Pain Present Now: No Coumadin Diabetic aspirin 35m Electronic Signature(s) Signed: 10/08/2021 12:56:33 PM By: [name] DredgeEntered By: [name] Dredgeon 10/07/2021 10:38:40 [name] -------------------------------------------------------------------------------- Clinic Level of Care Assessment Details Patient Name: [name] Date of Service: 10/07/2021 10:30 AM Medical Record Number: [id number] Account Number: [id number] of Birth/Sex: 07/18/1946-07-06(75 y.o. M) Treating RN: [name] DredgePrimary Care [name]: SFulton ReekOther Clinician: Referring [name]: SFulton ReekTreating [name]/Extender: SSkipper Clichein Treatment: 10 Clinic Level of Care Assessment Items TOOL 1 Quantity Score _0  - Use when EandM and Procedure is performed on INITIAL visit 0 ASSESSMENTS - Nursing Assessment /  Reassessment _1  - General Physical Exam (combine w/ comprehensive assessment (listed just below) when performed on new 0 pt. evals) _2  - 0 Comprehensive Assessment (HX, ROS, Risk Assessments, Wounds Hx, etc.) ASSESSMENTS - Wound and Skin Assessment / Reassessment _3  - Dermatologic / Skin Assessment (not related to wound area) 0 ASSESSMENTS - Ostomy and/or Continence Assessment and Care _4  - Incontinence Assessment and Management 0 _5  - 0 Ostomy Care Assessment and Management (repouching, etc.) PROCESS - Coordination of Care _6  - Simple Patient / Family Education for ongoing care 0 _7  - 0 Complex (extensive) Patient / Family Education for ongoing care _8  - 0 Staff obtains CProgrammer, systems Records, Test Results / Process Orders _9  - 0 Staff telephones HHA, Nursing Homes / Clarify orders / etc _10  - 0 Routine Transfer to another Facility (non-emergent condition) _11  - 0 Routine Hospital Admission (non-emergent condition) _12  - 0 New Admissions / IBiomedical engineer/ Ordering NPWT, Apligraf, etc. _13  - 0 Emergency Hospital Admission (emergent condition) PROCESS - Special Needs _14  - Pediatric / Minor Patient Management 0 _15  - 0 Isolation Patient Management _16  - 0 Hearing / Language / Visual special needs _17  - 0 Assessment of Community assistance (transportation, D/C planning, etc.) _18  - 0 Additional assistance / Altered mentation _19  - 0 Support Surface(s) Assessment (bed, cushion, seat, etc.) INTERVENTIONS - Miscellaneous _20  - External ear exam 0 _21  - 0 Patient Transfer (multiple staff / HCivil Service fast streamer/ Similar devices) _22  - 0 Simple Staple / Suture removal (25 or less) _23  - 0 Complex Staple / Suture removal (26 or more) _24  - 0 Hypo/Hyperglycemic Management (do not check if billed separately) _25  - 0 Ankle / Brachial Index (ABI) - do not check if billed separately Has the patient been seen at the hospital within the last three years: Yes Total Score: 0 Level Of Care:  ____ [name] Electronic Signature(s) Signed: 10/08/2021 12:56:33 PM By: [name] DredgeEntered By: [name] Dredgeon 10/07/2021 11:27:11 [name]  [name] ([id number]) -------------------------------------------------------------------------------- Encounter Discharge Information Details Patient Name: [name]. Date of Service: 10/07/2021 10:30 AM Medical Record Number: [id number] Patient Account Number: [id number] Date of Birth/Sex: [birth year] (75 y.o. M) Treating RN: [name] Primary Care [name]: [name] Other Clinician: Referring [name]: [name] Treating [name]/Extender: [name] in Treatment: 10 Encounter Discharge Information Items Post Procedure Vitals Discharge Condition: Stable Temperature (F): 97.6 Ambulatory Status: Cane Pulse (bpm): 55 Discharge Destination: Home Respiratory Rate (breaths/min): 18 Transportation: Private Auto Blood Pressure (mmHg): 145/90 Accompanied By: self Schedule Follow-up Appointment: Yes Clinical Summary of Care: Electronic Signature(s) Signed: 10/08/2021 12:56:33 PM By: [name] Entered By: [name] on 10/07/2021 11:31:38 [name] ([id number]) -------------------------------------------------------------------------------- Lower Extremity Assessment Details Patient Name: ZEPH, RIEBEL. Date of Service: 10/07/2021 10:30 AM Medical Record Number: [id number] Patient Account Number: [id number] Date of Birth/Sex: Feb 24, 1946 (75 y.o. M) Treating RN: [name] Primary Care Nola Botkins: [name] Other Clinician: Referring Camarion Weier: [name] Treating Eden Rho/Extender: Jeri Cos Weeks in Treatment: 10 Edema Assessment Assessed: [Left: No] [Right: No] Edema: [Left: Ye] [Right: s] Vascular Assessment Pulses: Dorsalis Pedis Doppler Audible: [Left:Yes] Electronic Signature(s) Signed: 10/08/2021 12:56:33 PM By: [name] Entered By: [name] on 10/07/2021 10:55:07 [name] ([id number]) -------------------------------------------------------------------------------- Multi Wound Chart Details Patient Name: [name], [name]. Date of Service: 10/07/2021 10:30 AM Medical Record Number: [id number] Patient Account Number: [id number] Date of Birth/Sex: 06-12-1946 (75 y.o. M) Treating RN: [name] Primary Care Gwendoline Judy: [name] Other Clinician: Referring Delsie Amador: [name] Treating Onetha Gaffey/Extender: [name] in Treatment: 10 Vital Signs Height(in): 2 Pulse(bpm): 31 Weight(lbs): 69 Blood Pressure(mmHg): 145/90 Body Mass Index(BMI): 39 Temperature(F): 97.6 Respiratory Rate(breaths/min): 18 Photos: [N/A:N/A] Wound Location: Left Calcaneus N/A N/A Wounding Event: Gradually Appeared N/A N/A Primary Etiology: Diabetic Wound/Ulcer of the Lower N/A N/A Extremity Comorbid History: Cataracts, Arrhythmia, Coronary N/A N/A Artery Disease, Hypertension, Type II Diabetes, Osteoarthritis, Neuropathy Date Acquired: 07/07/2021 N/A N/A Weeks of Treatment: 10 N/A N/A Wound Status: Open N/A N/A Measurements L x W x D (cm) 2x1.7x0.1 N/A N/A Area (cm) : 2.67 N/A N/A Volume (cm) : 0.267 N/A N/A % Reduction in Area: -21.40% N/A N/A % Reduction in Volume: -21.40% N/A N/A Classification: Grade 2 N/A N/A Exudate Amount: Large N/A N/A Exudate Type: Serous N/A N/A Exudate Color: amber N/A N/A Wound Margin: Thickened N/A N/A Granulation Amount: Small (1-33%) N/A N/A Granulation Quality: Red, Pink N/A N/A Necrotic Amount: Large (67-100%) N/A N/A Exposed Structures: Fat Layer (Subcutaneous Tissue): N/A N/A Yes Fascia: No Tendon: No Muscle: No Joint: No Bone: No Epithelialization: Small (1-33%) N/A N/A Treatment Notes Electronic Signature(s) Signed: 10/08/2021 12:56:33 PM By: [name] Entered By: [name] on 10/07/2021 11:14:31 [name] ([id number]) -------------------------------------------------------------------------------- Multi-Disciplinary Care Plan Details Patient Name: [name], [name]. Date of Service: 10/07/2021 10:30 AM Medical Record Number: [id number] Patient Account Number: [id number] Date of Birth/Sex: [birth year] (75 y.o. M) Treating RN: [name] Primary Care Yashika Mask: [name] Other Clinician: Referring Ziyan Hillmer: [name] Treating Bruchy Mikel/Extender: [name] in Treatment: 10 Active Inactive Wound/Skin Impairment Nursing Diagnoses: Impaired tissue integrity Knowledge deficit related to smoking impact on wound healing Knowledge deficit related to ulceration/compromised skin integrity Goals: Patient/caregiver will verbalize understanding of skin care regimen Date Initiated: 07/28/2021 Date Inactivated: 08/11/2021 Target Resolution Date: 08/08/2021 Goal Status: Met Ulcer/skin breakdown will have a volume reduction of 30% by week 4 Date Initiated: 07/28/2021 Target Resolution Date: 08/28/2021 Goal Status: Active Ulcer/skin breakdown will have a volume reduction  of 50% by week 8 Date Initiated: 07/28/2021 Target Resolution Date: 09/27/2021 Goal Status: Active Ulcer/skin breakdown will have a volume reduction of 80% by week 12 Date Initiated: 07/28/2021 Target Resolution Date: 10/28/2020 Goal Status: Active Ulcer/skin breakdown will heal within 14 weeks Date Initiated: 07/28/2021 Target Resolution Date: 11/12/2020 Goal Status: Active Interventions: Assess patient/caregiver ability to obtain necessary supplies Assess patient/caregiver ability to perform ulcer/skin care regimen upon admission and as needed Assess ulceration(s) every visit Notes: Electronic Signature(s) Signed: 10/08/2021 12:56:33 PM By: [name] Entered By: [name] on 10/07/2021 11:14:01 [name]  ([id number]) -------------------------------------------------------------------------------- Pain Assessment Details Patient Name: [name], [name]. Date of Service: 10/07/2021 10:30 AM Medical Record Number: [id number] Patient Account Number: [id number] Date of Birth/Sex: 09/21/1946 (75 y.o. M) Treating RN: [name] Primary Care Sendy Pluta: [name] Other Clinician: Referring Alpheus Stiff: [name] Treating Wendi Lastra/Extender: [name] in Treatment: 10 Active Problems Location of Pain Severity and Description of Pain Patient Has Paino No Site Locations Rate the pain. Current Pain Level: 0 Pain Management and Medication Current Pain Management: Electronic Signature(s) Signed: 10/08/2021 12:56:33 PM By: [name] Entered By: [name] on 10/07/2021 10:39:25 [name] ([id number]) -------------------------------------------------------------------------------- Patient/Caregiver Education Details Patient Name: [name], [name]. Date of Service: 10/07/2021 10:30 AM Medical Record Number: [id number] Patient Account Number: [id number] Date of Birth/Gender: 29-Dec-1945 (75 y.o. M) Treating RN: [name] Primary Care Physician: [name] Other Clinician: Referring Physician: Fulton [name] Treating Physician/Extender: [name] in Treatment: 10 Education Assessment Education Provided To: Patient Education Topics Provided Wound/Skin Impairment: Handouts: Caring for Your Ulcer Methods: Explain/Verbal Responses: State content correctly Electronic Signature(s) Signed: 10/08/2021 12:56:33 PM By: [name] Entered By: [name] on 10/07/2021 11:28:21 [name], [name] ([id number]) -------------------------------------------------------------------------------- Wound Assessment Details Patient Name: [name], [name]. Date of Service: 10/07/2021 10:30 AM Medical Record Number: [id number] Patient Account  Number: [id number] Date of Birth/Sex: 1945/11/27 (75 y.o. M) Treating RN: [name] Primary Care Antanette Richwine: [name] Other Clinician: Referring Millee Denise: [name] Treating Brookley Spitler/Extender: [name] in Treatment: 10 Wound Status Wound Number: 9 Primary Diabetic Wound/Ulcer of the Lower Extremity Etiology: Wound Location: Left Calcaneus Wound Open Wounding Event: Gradually Appeared Status: Date Acquired: 07/07/2021 Comorbid Cataracts, Arrhythmia, Coronary Artery Disease, Weeks Of Treatment: 10 History: Hypertension, Type II Diabetes, Osteoarthritis, Clustered Wound: No Neuropathy Photos Wound Measurements Length: (cm) 2 Width: (cm) 1.7 Depth: (cm) 0.1 Area: (cm) 2.67 Volume: (cm) 0.267 % Reduction in Area: -21.4% % Reduction in Volume: -21.4% Epithelialization: Small (1-33%) Tunneling: No Undermining: No Wound Description Classification: Grade 2 Wound Margin: Thickened Exudate Amount: Large Exudate Type: Serous Exudate Color: amber Foul Odor After Cleansing: No Slough/Fibrino Yes Wound Bed Granulation Amount: Small (1-33%) Exposed Structure Granulation Quality: Red, Pink Fascia Exposed: No Necrotic Amount: Large (67-100%) Fat Layer (Subcutaneous Tissue) Exposed: Yes Necrotic Quality: Adherent Slough Tendon Exposed: No Muscle Exposed: No Joint Exposed: No Bone Exposed: No Treatment Notes Wound #9 (Calcaneus) Wound Laterality: Left Cleanser Normal Saline Discharge Instruction: Wash your hands with soap and water. Remove old dressing, discard into plastic bag and place into trash. Cleanse the wound with Normal Saline prior to applying a clean dressing using gauze sponges, not tissues or cotton balls. Do not [name], [name] ([id number]) scrub or use excessive force. Pat dry using gauze sponges, not tissue or cotton balls. Peri-Wound Care Topical Primary Dressing Silvercel Small 2x2 (in/in) Discharge Instruction: Apply Silvercel  Small 2x2 (in/in) as instructed Secondary Dressing ABD Pad 5x9 (in/in) Discharge Instruction: Cover  with ABD pad heel cup Discharge Instruction: apply over dressing to protect heel Secured With 26M Medipore H Soft Cloth Surgical Tape, 2x2 (in/yd) Coban Cohesive Bandage 4x5 (yds) Stretched Discharge Instruction: Apply Coban as directed. Kerlix Roll Sterile or Non-Sterile 6-ply 4.5x4 (yd/yd) Discharge Instruction: Apply Kerlix as directed Compression Wrap Compression Stockings Add-Ons Prevalon boot Discharge Instruction: use while in bed Electronic Signature(s) Signed: 10/08/2021 12:56:33 PM By: [name] Entered By: [name] on 10/07/2021 10:51:02 [name] ([id number]) -------------------------------------------------------------------------------- Vitals Details Patient Name: CORDARRELL, SANE. Date of Service: 10/07/2021 10:30 AM Medical Record Number: [id number] Patient Account Number: [id number] Date of Birth/Sex: 12/09/1945 (75 y.o. M) Treating RN: [name] Primary Care [place] Bowlds: [name] Other Clinician: Referring Noela Brothers: [name] Treating Tyri Elmore/Extender: [name] in Treatment: 10 Vital Signs Time Taken: 10:38 Temperature (F): 97.6 Height (in): 69 Pulse (bpm): 55 Weight (lbs): 267 Respiratory Rate (breaths/min): 18 Body Mass Index (BMI): 39.4 Blood Pressure (mmHg): 145/90 Reference Range: 80 - 120 mg / dl Electronic Signature(s) Signed: 10/08/2021 12:56:33 PM By: [name] Entered By: [name] on 10/07/2021 10:39:11

## 2021-10-07 NOTE — Progress Notes (Addendum)
DWAYN, MORAVEK ([id number]) Visit Report for 10/07/2021 Chief Complaint Document Details Patient Name: [name], [name]. Date of Service: 10/07/2021 10:30 AM Medical Record Number: [id number] Patient Account Number: [id number] Date of Birth/Sex: 16-Aug-1946 (75 y.o. M) Treating RN: Levora Dredge Primary Care Provider: Fulton Reek Other Clinician: Referring Provider: Fulton Reek Treating Provider/Extender: Skipper Cliche in Treatment: 10 Information Obtained from: Patient Chief Complaint Right LE ulcers and cellulitis Electronic Signature(s) Signed: 10/07/2021 10:36:49 AM By: Worthy Keeler PA-C Entered By: Worthy Keeler on 10/07/2021 10:36:48 [name] ([id number]) -------------------------------------------------------------------------------- Debridement Details Patient Name: [name], [name]. Date of Service: 10/07/2021 10:30 AM Medical Record Number: [id number] Patient Account Number: [id number] Date of Birth/Sex: May 21, 1946 (75 y.o. M) Treating RN: Levora Dredge Primary Care Provider: Fulton Reek Other Clinician: Referring Provider: Fulton Reek Treating Provider/Extender: Skipper Cliche in Treatment: 10 Debridement Performed for Wound #9 Left Calcaneus Assessment: Performed By: Physician Tommie Sams., PA-C Debridement Type: Debridement Severity of Tissue Pre Debridement: Fat layer exposed Level of Consciousness (Pre- Awake and Alert procedure): Pre-procedure Verification/Time Out Yes - 11:15 Taken: Total Area Debrided (L x W): 2 (cm) x 1.7 (cm) = 3.4 (cm) Tissue and other material Non-Viable, Callus, Slough, Subcutaneous, Biofilm, Slough debrided: Level: Skin/Subcutaneous Tissue Debridement Description: Excisional Instrument: Curette Bleeding: Minimum Hemostasis Achieved: Pressure Response to Treatment: Procedure was tolerated well Level of Consciousness (Post- Awake and Alert procedure): Post Debridement Measurements  of Total Wound Length: (cm) 2 Width: (cm) 1.7 Depth: (cm) 0.1 Volume: (cm) 0.267 Character of Wound/Ulcer Post Debridement: Stable Severity of Tissue Post Debridement: Fat layer exposed Post Procedure Diagnosis Same as Pre-procedure Electronic Signature(s) Signed: 10/07/2021 4:40:54 PM By: Worthy Keeler PA-C Signed: 10/08/2021 12:56:33 PM By: Levora Dredge Entered By: Levora Dredge on 10/07/2021 11:16:52 [name] ([id number]) -------------------------------------------------------------------------------- HPI Details Patient Name: [name], [name]. Date of Service: 10/07/2021 10:30 AM Medical Record Number: [id number] Patient Account Number: [id number] Date of Birth/Sex: 1946-09-09 (75 y.o. M) Treating RN: Levora Dredge Primary Care Provider: Fulton Reek Other Clinician: Referring Provider: Fulton Reek Treating Provider/Extender: Skipper Cliche in Treatment: 10 History of Present Illness HPI Description: 09/24/2020 on evaluation today patient presents today for a heel ulcer that he tells me has been present for about 2 years. He has been seeing podiatry and they have been attempting to manage this including what sounds to be a total contact cast, Unna boot, and just standard dressings otherwise as well. Most recently has been using triple antibiotic ointment. With that being said unfortunately despite everything he really has not had any significant improvement. He tells me that he cannot even really remember exactly how this began but he presumed it may have rubbed on his shoes or something of that nature. 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 He tells me that this does seem  to help. Fortunately there is no signs of active infection at this time. His most recent hemoglobin A1c was 8.1 though he knows this was this year he cannot tell me the exact time. His fluid pills currently to help with some of the lower extremity edema although he does obviously have signs of venous stasis/lymphedema. Currently there is no evidence of active infection. No  fevers, chills, nausea, vomiting, or diarrhea. Patient has had fairly recent ABIs which were performed on 07/19/2020 and revealed that he has normal findings in both the ankle and toe locations bilaterally. His ABI on the right was 1.09 on the left was 1.08 with a TBI on the right of 0.88 and on the left of 0.94. Triphasic flow was noted throughout. 10/08/2020 on evaluation today patient appears to be doing pretty well in regard to his left heel currently in fact this is doing a great job and seems to be healing quite nicely. Unfortunately on his right leg he had a pile of wood that actually fell on him injuring his right leg this is somewhat erythematous has me concerned little bit about cellulitis though there is not really a good area to culture at this point. 10/24/2020 upon evaluation today patient appears to be doing well with regard to his heel ulcer. He is showing signs of improvement which is great news. His right leg is completely healed. Overall I feel like he is doing excellent and there is no signs of infection. 11/07/2020 upon evaluation today patient appears to be doing well with regard to his heel ulcer. He tells me that last week when he was unable to come in his wife actually thought that the wound was very close to closing if not closed. Then it began to "reopen again". I really feel like what may have happened as the collagen may have dried over the wound bed and that because that misunderstanding with thinking that the wound was healing. With that being said I did not see it last week I do not know that for certain.  Either way I feel like he is doing great today I see no signs of infection at this point. 11/26/2020 upon inspection today patient appears to be doing decently well in regard to his heel ulcer. He has been tolerating the dressing changes without complication. Fortunately there is no sign of active infection at this time. No fevers, chills, nausea, vomiting, or diarrhea. 12/10/2020 upon evaluation today patient appears to be doing fairly well in regard to the wound on his heel as well as what appears to be a new wound of the left first metatarsal head plantar aspect. This seems to be an area that was callus that has split as the patient tells me has been trying to walk on his toes more has probably where this came from. With that being said there does not appear to be signs of active infection which is great news. 12/17/2020 upon evaluation today patient actually appears to be making good progress currently. Fortunately there is no evidence of active infection at this time. Overall I feel like he is very close to complete closure. 12/26/2020 upon evaluation today patient appears to be doing excellent in regard to his wounds. In fact I am not certain that these are not even completely healed on initial inspection. Overall I am very pleased with where things stand today. Good news is that they are healed he is actually get ready to go out of town and that will be helpful as well as he will be a full part of the time. Readmission: 02/04/2021 upon evaluation today patient appears to be doing well at this point in regard to his left heel that I previously saw him for. Unfortunately he is having issues with his right lower extremity. He has significant wounds at this point he also has erythema noted there is definite signs of cellulitis  which is unfortunate. With that being said I think we do need to address this sooner rather than later. The good news is he did have a nice trip to the beach. He tells me that he  had no issues during that time. 4/27; patient on Bactrim. Culture showed methicillin sensitive staph aureus therefore the Bactrim should be effective. 2 small areas on the right leg are healed the area on the mid aspect of the tibia almost 100% covered in a very adherent necrotic debris. We have been using silver alginate 02/20/2021 upon evaluation today patient appears to be doing well with regard to his wound. He is showing signs of improvement which is great news overall very pleased with where things stand today. No fevers, chills, nausea, vomiting, or diarrhea. 02/27/2021 upon evaluation today patient appears to be doing well with regard to his leg ulcer. He is tolerating the dressing changes and overall appears to be doing quite excellent. I am extremely pleased with where things stand and overall I think patient is making great progress. There is no sign of active infection at this time which is also great news. 03/06/2021 upon evaluation today patient appears to be doing excellent in regard to his wounds. In fact he appears to be completely healed today based on what I am seeing. This is excellent news and overall I am extremely pleased with where he stands. Overall the patient is happy to hear this as well this has been quite sometime coming. Readmission: 04/01/2021 patient unfortunately returns for readmission today. He tells me that he has been wearing his compression socks on the right leg daily AMRIT, CRESS ([id number]) and he does not really know what is going on and why his legs are doing what they are doing. We did therefore go ahead and probe deeper into exactly what has been going on with him today. Subsequently the patient tells me that when he gets up and what we would call "first thing in the morning" are really 2 different things". He does not tend to sleep well so he tells me that he will often wake up at 3:00 in the morning. He will then potentially going into the living room  to get in his chair where he may read a book for a little while and then potentially fall asleep back in his chair. He then subsequently wake up around 5:00 or so and then get up and in his words "putter around". This often will end with him proceeding at some point around 8 AM or 9 AM to putting on his compression socks. With that being said this means that anywhere from the 3:00 in the morning till roughly around 9:00 in the morning he has no compression on yet he is sitting in his recliner, walking around and up and about, and this is at least 5 to 6 hours of noncompressed time. That may be our issue here but I did not realize until we discussed this further today. Fortunately there does not appear to be any signs of active infection at this time which is great news. With that being said he has multiple wounds of the bilateral lower extremities. 04/10/2021 upon evaluation today patient appears to be doing well with regard to his legs for the most part. He does look like he had some injury where his wrap slid down nonetheless he did some "doctoring on them". I do feel like most of the areas honestly have cleared back up which is great news. There  does not appear to be any signs of active infection at this time which is also great news. No fevers, chills, nausea, vomiting, or diarrhea. 04/18/2021 upon evaluation today patient appears to be doing well with regard to his wounds in fact he appears to be completely healed which is great news. Fortunately there does not appear to be any signs of active infection which is great news. No fevers, chills, nausea, vomiting, or diarrhea. READMISSION 07/28/2021 This is a now 75 year old man we have had in this clinic for quite a bit of this year. 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 He wears compression stockings. 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 He comes in an old running shoes although he says he wears better shoes at home. His history is that he noticed a blister in the right posterior heel. This open. He has been applying Neosporin to it currently the wound measures 2 x 1.4 cm. 100% slough covered. Not really offloading this in any rigorous way. Past medical history is essentially unchanged he has paroxysmal atrial fibrillation on Coumadin type 2 diabetes with a recent hemoglobin A1c of 7 on metformin. ABI in our clinic was 0.98 on the right 08/05/2021 upon evaluation today patient appears to be doing well with regard to his heel ulcer. Fortunately there does not appear to be any signs of active infection at this time. Overall I been very pleased with where things seem to be currently as far as the wound healing is concerned. Again this is first a lot of seeing him Dr. Dellia Nims readmitted him last week. Nonetheless I think we are definitely making some progress here. 08/11/2021 upon evaluation today patient's wound on the heel actually showing signs of excellent improvement. I am actually very pleased with where things stand currently. No fevers, chills, nausea, vomiting, or diarrhea. There does not appear to be any need for sharp debridement today either which is also great news. 08/25/2021 upon evaluation today patient appears to be doing well with regard to his heel ulcer. This is actually looking significantly improved compared to last time I saw him. Fortunately there does not appear to be any evidence of active infection at this time. No fevers, chills, nausea, vomiting, or diarrhea. 09/01/2021 upon evaluation today patient appears to be doing decently well in regard to his wounds. Fortunately there does not appear to be any signs of active infection at this time which is great news and overall very pleased in that regard. I do not see any evidence of active infection systemically which is great news as well. No  fevers, chills, nausea, vomiting, or diarrhea. I think that the heel is making excellent progress. 09/15/2021 upon evaluation today patient's heel unfortunately is significantly worse compared to what it was previous. I do believe that at this time he would benefit from switching back to something a little bit more offloading from the cushion shoe he has right now this is more of a heel protector what I really need is a Prevalon offloading boot which I think is good to do a lot better for him than just the small heel protector. 09/23/2021 upon evaluation today patient appears to be doing a little better in regards to last week's visit. Overall I think that he is making good progress which is great news and there does not appear to be any evidence of active infection at this time. No fevers,  chills, nausea, vomiting, or diarrhea. 09/30/2021 upon evaluation patient's heel is actually showing signs of good improvement which is great news. Fortunately there does not appear to be any evidence of active infection locally nor systemically at this point. No fevers, chills, nausea, vomiting, or diarrhea. 10/07/2021 upon evaluation today patient appears to be doing well currently in regard to his heel ulcer. He has been tolerating the dressing changes without complication. Fortunately I do not see any signs of active infection at this time which is great news. No fevers, chills, nausea, vomiting, or diarrhea. Electronic Signature(s) Signed: 10/07/2021 3:08:02 PM By: Worthy Keeler PA-C Entered By: Worthy Keeler on 10/07/2021 15:08:02 [name], [name] ([id number]) -------------------------------------------------------------------------------- Physical Exam Details Patient Name: REBECCA, MOTTA Date of Service: 10/07/2021 10:30 AM Medical Record Number: [id number] Patient Account Number: [id number] Date of Birth/Sex: November 08, 1945 (75 y.o. M) Treating RN: Levora Dredge Primary Care Provider: Fulton Reek Other Clinician: Referring Provider: Fulton Reek Treating Provider/Extender: Skipper Cliche in Treatment: 58 Constitutional Well-nourished and well-hydrated in no acute distress. Respiratory normal breathing without difficulty. Psychiatric this patient is able to make decisions and demonstrates good insight into disease process. Alert and Oriented x 3. pleasant and cooperative. Notes Upon inspection patient's wound bed actually showed signs of good granulation and epithelization at this point. Fortunately there does not appear to be any signs of active infection systemically nor locally and overall I think we are headed in the right direction. I did debride the wound to clear away the slough and necrotic debris postdebridement this is much better. Electronic Signature(s) Signed: 10/07/2021 3:08:26 PM By: Worthy Keeler PA-C Entered By: Worthy Keeler on 10/07/2021 15:08:25 [name] ([id number]) -------------------------------------------------------------------------------- Physician Orders Details Patient Name: ZAYDN, GUTRIDGE. Date of Service: 10/07/2021 10:30 AM Medical Record Number: [id number] Patient Account Number: [id number] Date of Birth/Sex: May 30, 1946 (75 y.o. 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 o Nurse Visit as needed - call to schedule Bathing/ Shower/ Hygiene o May shower with wound dressing protected with water repellent cover or cast protector. o No tub  bath. Edema Control - Lymphedema / Segmental Compressive Device / Other o Patient to wear own compression stockings. Remove compression stockings every night before going to bed and put on every morning when getting up. - right leg o Elevate leg(s) parallel to the floor when sitting. o DO YOUR BEST to sleep in the bed at night. DO NOT sleep in your recliner. Long hours of sitting in a recliner leads to swelling of the legs and/or potential wounds on your backside. Off-Loading o Other: - L heel offloader shoe Keep pressure off of heel in the bed-float heel in [place] order from [employer], see sheet Additional Orders / Instructions o Follow Nutritious Diet and Increase Protein Intake - check blood sugar and eat protein for wound healing Wound Treatment Wound #9 - Calcaneus Wound Laterality: Left Cleanser: Normal Saline Every Other Day/15 Days Discharge Instructions: Wash your hands with soap and water. Remove old dressing, discard into plastic bag and place into trash. Cleanse the wound with Normal Saline prior to applying  a clean dressing using gauze sponges, not tissues or cotton balls. Do not scrub or use excessive force. Pat dry using gauze sponges, not tissue or cotton balls. Primary Dressing: Silvercel Small 2x2 (in/in) (Generic) Every Other Day/15 Days Discharge Instructions: Apply Silvercel Small 2x2 (in/in) as instructed Secondary Dressing: ABD Pad 5x9 (in/in) Every Other Day/15 Days Discharge Instructions: Cover with ABD pad Secondary Dressing: heel cup Every Other Day/15 Days Discharge Instructions: apply over dressing to protect heel Secured With: 80M Medipore H Soft Cloth Surgical Tape, 2x2 (in/yd) (Generic) Every Other Day/15 Days Secured With: Coban Cohesive Bandage 4x5 (yds) Stretched Every Other Day/15 Days Discharge Instructions: Apply Coban as directed. Secured With: [employer] or Non-Sterile 6-ply 4.5x4 (yd/yd) (Generic) Every Other Day/15  Days Discharge Instructions: Apply Kerlix as directed Add-Ons: Prevalon boot Every Other Day/15 Days Discharge Instructions: use while in bed [name], [name] ([id number]) Electronic Signature(s) Signed: 10/07/2021 4:40:54 PM By: Worthy Keeler PA-C Signed: 10/08/2021 12:56:33 PM By: Levora Dredge Entered By: Levora Dredge on 10/07/2021 11:26:28 [name], [name] ([id number]) -------------------------------------------------------------------------------- Problem List Details Patient Name: [name], [name]. Date of Service: 10/07/2021 10:30 AM Medical Record Number: [id number] Patient Account Number: [id number] Date of Birth/Sex: [birth year] (75 y.o. 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 Date of Service: 10/07/2021 10:30 AM Medical Record Number: [id number] Patient Account Number: [id number] Date of Birth/Sex: Oct 23, 1945 (75 y.o. M) Treating RN: Levora Dredge Primary Care Provider: Fulton Reek Other Clinician: Referring Provider: Fulton Reek Treating Provider/Extender: Skipper Cliche in Treatment: 10 Subjective Chief Complaint Information obtained from  Patient Right LE ulcers and cellulitis History of Present Illness (HPI) 09/24/2020 on evaluation today patient presents today for a heel ulcer that he tells me has been present for about 2 years. He has been seeing podiatry and they have been attempting to manage this including what sounds to be a total contact cast, Unna boot, and just standard dressings otherwise as well. Most recently has been using triple antibiotic ointment. With that being said unfortunately despite everything he really has not had any significant improvement. He tells me that he cannot even really remember exactly how this began but he presumed it may have rubbed on his shoes or something of that nature. 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 He tells me that this does seem to help. Fortunately there is no signs of active infection at this time. His most recent hemoglobin A1c was 8.1 though he knows this was this year he cannot tell me the exact time. His fluid pills currently to help with some of the lower extremity edema although he does obviously  have signs of venous stasis/lymphedema. Currently there is no evidence of active infection. No fevers, chills, nausea, vomiting, or diarrhea. Patient has had fairly recent ABIs which were performed on 07/19/2020 and revealed that he has normal findings in both the ankle and toe locations bilaterally. His ABI on the right was 1.09 on the left was 1.08 with a TBI on the right of 0.88 and on the left of 0.94. Triphasic flow was noted throughout. 10/08/2020 on evaluation today patient appears to be doing pretty well in regard to his left heel currently in fact this is doing a great job and seems to be  healing quite nicely. Unfortunately on his right leg he had a pile of wood that actually fell on him injuring his right leg this is somewhat erythematous has me concerned little bit about cellulitis though there is not really a good area to culture at this point. 10/24/2020 upon evaluation today patient appears to be doing well with regard to his heel ulcer. He is showing signs of improvement which is great news. His right leg is completely healed. Overall I feel like he is doing excellent and there is no signs of infection. 11/07/2020 upon evaluation today patient appears to be doing well with regard to his heel ulcer. He tells me that last week when he was unable to come in his wife actually thought that the wound was very close to closing if not closed. Then it began to "reopen again". I really feel like what may have happened as the collagen may have dried over the wound bed and that because that misunderstanding with thinking that the wound was healing. With that being said I did not see it last week I do not know that for certain. Either way I feel like he is doing great today I see no signs of infection at this point. 11/26/2020 upon inspection today patient appears to be doing decently well in regard to his heel ulcer. He has been tolerating the dressing changes without complication. Fortunately there is no sign of active infection at this time. No fevers, chills, nausea, vomiting, or diarrhea. 12/10/2020 upon evaluation today patient appears to be doing fairly well in regard to the wound on his heel as well as what appears to be a new wound of the left first metatarsal head plantar aspect. This seems to be an area that was callus that has split as the patient tells me has been trying to walk on his toes more has probably where this came from. With that being said there does not appear to be signs of active infection which is great news. 12/17/2020 upon evaluation today patient actually appears to be  making good progress currently. Fortunately there is no evidence of active infection at this time. Overall I feel like he is very close to complete closure. 12/26/2020 upon evaluation today patient appears to be doing excellent in regard to his wounds. In fact I am not certain that these are not even completely healed on initial inspection. Overall I am very pleased with where things stand today. Good news is that they are healed he is actually get ready to go out of town and that will be helpful as well as he will be a full part of the time. Readmission: 02/04/2021 upon evaluation today patient appears to be doing well at this point in regard to his left heel that I previously saw him for. Unfortunately he is having issues with his right lower extremity. He has significant  wounds at this point he also has erythema noted there is definite signs of cellulitis which is unfortunate. With that being said I think we do need to address this sooner rather than later. The good news is he did have a nice trip to the beach. He tells me that he had no issues during that time. 4/27; patient on Bactrim. Culture showed methicillin sensitive staph aureus therefore the Bactrim should be effective. 2 small areas on the right leg are healed the area on the mid aspect of the tibia almost 100% covered in a very adherent necrotic debris. We have been using silver alginate 02/20/2021 upon evaluation today patient appears to be doing well with regard to his wound. He is showing signs of improvement which is great news overall very pleased with where things stand today. No fevers, chills, nausea, vomiting, or diarrhea. 02/27/2021 upon evaluation today patient appears to be doing well with regard to his leg ulcer. He is tolerating the dressing changes and overall appears to be doing quite excellent. I am extremely pleased with where things stand and overall I think patient is making great progress. There is no sign of active  infection at this time which is also great news. 03/06/2021 upon evaluation today patient appears to be doing excellent in regard to his wounds. In fact he appears to be completely healed today based on what I am seeing. This is excellent news and overall I am extremely pleased with where he stands. Overall the patient is happy to hear [name], [name] ([id number]) this as well this has been quite sometime coming. Readmission: 04/01/2021 patient unfortunately returns for readmission today. He tells me that he has been wearing his compression socks on the right leg daily and he does not really know what is going on and why his legs are doing what they are doing. We did therefore go ahead and probe deeper into exactly what has been going on with him today. Subsequently the patient tells me that when he gets up and what we would call "first thing in the morning" are really 2 different things". He does not tend to sleep well so he tells me that he will often wake up at 3:00 in the morning. He will then potentially going into the living room to get in his chair where he may read a book for a little while and then potentially fall asleep back in his chair. He then subsequently wake up around 5:00 or so and then get up and in his words "putter around". This often will end with him proceeding at some point around 8 AM or 9 AM to putting on his compression socks. With that being said this means that anywhere from the 3:00 in the morning till roughly around 9:00 in the morning he has no compression on yet he is sitting in his recliner, walking around and up and about, and this is at least 5 to 6 hours of noncompressed time. That may be our issue here but I did not realize until we discussed this further today. Fortunately there does not appear to be any signs of active infection at this time which is great news. With that being said he has multiple wounds of the bilateral lower extremities. 04/10/2021 upon  evaluation today patient appears to be doing well with regard to his legs for the most part. He does look like he had some injury where his wrap slid down nonetheless he did some "doctoring on them". I do feel  like most of the areas honestly have cleared back up which is great news. There does not appear to be any signs of active infection at this time which is also great news. No fevers, chills, nausea, vomiting, or diarrhea. 04/18/2021 upon evaluation today patient appears to be doing well with regard to his wounds in fact he appears to be completely healed which is great news. Fortunately there does not appear to be any signs of active infection which is great news. No fevers, chills, nausea, vomiting, or diarrhea. READMISSION 07/28/2021 This is a now 75 year old man we have had in this clinic for quite a bit of this year. 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 He wears compression stockings. 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 He comes in an old running shoes although he says he wears better shoes at home. His history is that he noticed a blister in the right posterior heel. This open. He has been applying Neosporin to it currently the wound measures 2 x 1.4 cm. 100% slough covered. Not really offloading this in any rigorous way. Past medical history is essentially unchanged he has paroxysmal atrial fibrillation on Coumadin type 2 diabetes with a recent hemoglobin A1c of 7 on metformin. ABI in our clinic was 0.98 on the right 08/05/2021 upon evaluation today patient appears to be doing well with regard to his heel ulcer. Fortunately there does not appear to be any signs of active infection at this time. Overall I been very pleased with where things seem to be currently as far as the wound healing is concerned. Again this is first a lot of seeing him Dr. Dellia Nims readmitted him  last week. Nonetheless I think we are definitely making some progress here. 08/11/2021 upon evaluation today patient's wound on the heel actually showing signs of excellent improvement. I am actually very pleased with where things stand currently. No fevers, chills, nausea, vomiting, or diarrhea. There does not appear to be any need for sharp debridement today either which is also great news. 08/25/2021 upon evaluation today patient appears to be doing well with regard to his heel ulcer. This is actually looking significantly improved compared to last time I saw him. Fortunately there does not appear to be any evidence of active infection at this time. No fevers, chills, nausea, vomiting, or diarrhea. 09/01/2021 upon evaluation today patient appears to be doing decently well in regard to his wounds. Fortunately there does not appear to be any signs of active infection at this time which is great news and overall very pleased in that regard. I do not see any evidence of active infection systemically which is great news as well. No fevers, chills, nausea, vomiting, or diarrhea. I think that the heel is making excellent progress. 09/15/2021 upon evaluation today patient's heel unfortunately is significantly worse compared to what it was previous. I do believe that at this time he would benefit from switching back to something a little bit more offloading from the cushion shoe he has right now this is more of a heel protector what I really need is a Prevalon offloading boot which I think is good to do a lot better for him than just the small heel protector. 09/23/2021 upon evaluation today patient appears to be doing a little better in regards to last week's visit. Overall I think that he is making good progress which is great news and there does  not appear to be any evidence of active infection at this time. No fevers, chills, nausea, vomiting, or diarrhea. 09/30/2021 upon evaluation patient's heel is  actually showing signs of good improvement which is great news. Fortunately there does not appear to be any evidence of active infection locally nor systemically at this point. No fevers, chills, nausea, vomiting, or diarrhea. 10/07/2021 upon evaluation today patient appears to be doing well currently in regard to his heel ulcer. He has been tolerating the dressing changes without complication. Fortunately I do not see any signs of active infection at this time which is great news. No fevers, chills, nausea, vomiting, or diarrhea. Objective [name], [name] ([id number]) Constitutional Well-nourished and well-hydrated in no acute distress. Vitals Time Taken: 10:38 AM, Height: 69 in, Weight: 267 lbs, BMI: 39.4, Temperature: 97.6 F, Pulse: 55 bpm, Respiratory Rate: 18 breaths/min, Blood Pressure: 145/90 mmHg. Respiratory normal breathing without difficulty. Psychiatric this patient is able to make decisions and demonstrates good insight into disease process. Alert and Oriented x 3. pleasant and cooperative. General Notes: Upon inspection patient's wound bed actually showed signs of good granulation and epithelization at this point. Fortunately there does not appear to be any signs of active infection systemically nor locally and overall I think we are headed in the right direction. I did debride the wound to clear away the slough and necrotic debris postdebridement this is much better. Integumentary (Hair, Skin) Wound #9 status is Open. Original cause of wound was Gradually Appeared. The date acquired was: 07/07/2021. The wound has been in treatment 10 weeks. The wound is located on the Left Calcaneus. The wound measures 2cm length x 1.7cm width x 0.1cm depth; 2.67cm^2 area and 0.267cm^3 volume. There is Fat Layer (Subcutaneous Tissue) exposed. There is no tunneling or undermining noted. There is a large amount of serous drainage noted. The wound margin is thickened. There is small (1-33%) red,  pink granulation within the wound bed. There is a large (67- 100%) amount of necrotic tissue within the wound bed including Adherent Slough. 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 There was a Excisional Skin/Subcutaneous Tissue Debridement with a total area of 3.4 sq cm performed by Tommie Sams., PA-C. With the following instrument(s): Curette to remove Non-Viable tissue/material. Material removed includes Callus, Subcutaneous Tissue, Slough, and Biofilm. No specimens were taken. A time out was conducted at 11:15, prior to the start of the procedure. A Minimum amount of bleeding was controlled with Pressure. The procedure was tolerated well. Post Debridement Measurements: 2cm length x 1.7cm width x 0.1cm depth; 0.267cm^3 volume. Character of Wound/Ulcer Post Debridement is stable. Severity of Tissue Post Debridement is: Fat layer exposed. Post procedure Diagnosis Wound #9: Same as Pre-Procedure Plan Follow-up Appointments: Return Appointment in 1 week. Nurse Visit as needed - call to schedule Bathing/ Shower/ Hygiene: May shower with wound dressing protected with water repellent cover or cast protector. No tub bath. Edema Control - Lymphedema / Segmental Compressive Device / Other: Patient to wear own compression stockings. Remove compression stockings every night before going to bed and put on every morning when getting up. - right leg Elevate leg(s) parallel to the floor when sitting. DO YOUR  BEST to sleep in the bed at night. DO NOT sleep in your recliner. Long hours of sitting in a recliner leads to swelling of the legs  and/or potential wounds on your backside. [name], [name] ([id number]) Off-Loading: Other: - L heel offloader shoe Keep pressure off of heel in the bed-float heel in [place] order from [employer], see sheet Additional Orders / Instructions: Follow Nutritious Diet and Increase Protein Intake - check blood sugar and eat protein for wound healing WOUND #9: - Calcaneus Wound Laterality: Left Cleanser: Normal Saline Every Other Day/15 Days Discharge Instructions: Wash your hands with soap and water. Remove old dressing, discard into plastic bag and place into trash. Cleanse the wound with Normal Saline prior to applying a clean dressing using gauze sponges, not tissues or cotton balls. Do not scrub or use excessive force. Pat dry using gauze sponges, not tissue or cotton balls. Primary Dressing: Silvercel Small 2x2 (in/in) (Generic) Every Other Day/15 Days Discharge Instructions: Apply Silvercel Small 2x2 (in/in) as instructed Secondary Dressing: ABD Pad 5x9 (in/in) Every Other Day/15 Days Discharge Instructions: Cover with ABD pad Secondary Dressing: heel cup Every Other Day/15 Days Discharge Instructions: apply over dressing to protect heel Secured With: 79M Medipore H Soft Cloth Surgical Tape, 2x2 (in/yd) (Generic) Every Other Day/15 Days Secured With: Coban Cohesive Bandage 4x5 (yds) Stretched Every Other Day/15 Days Discharge Instructions: Apply Coban as directed. Secured With: [employer] or Non-Sterile 6-ply 4.5x4 (yd/yd) (Generic) Every Other Day/15 Days Discharge Instructions: Apply Kerlix as directed Add-Ons: Prevalon boot Every Other Day/15 Days Discharge Instructions: use while in bed 1. Would recommend currently that we go ahead and continue with the wound care measures as before and the patient is in agreement with plan. This includes the use of the silver alginate dressing which I think is doing a good job here. 2. I am also can recommend that we have the patient  continue with the ABD pad to cover in order to protect the area. We will see patient back for reevaluation in 1 week here in the clinic. If anything worsens or changes patient will contact our office for additional recommendations. Electronic Signature(s) Signed: 10/07/2021 3:09:14 PM By: Worthy Keeler PA-C Entered By: Worthy Keeler on 10/07/2021 15:09:14 [name], [name] ([id number]) -------------------------------------------------------------------------------- SuperBill Details Patient Name: [name], [name]. Date of Service: 10/07/2021 Medical Record Number: [id number] Patient Account Number: [id number] Date of Birth/Sex: March 22, 1946 (75 y.o. M) Treating RN: Levora Dredge Primary Care Provider: Fulton Reek Other Clinician: Referring Provider: Fulton Reek Treating Provider/Extender: Skipper Cliche in Treatment: 10 Diagnosis Coding ICD-10 Codes Code Description E11.621 Type 2 diabetes mellitus with foot ulcer L97.421 Non-pressure chronic ulcer of left heel and midfoot limited to breakdown of skin E11.42 Type 2 diabetes mellitus with diabetic polyneuropathy Facility Procedures CPT4 Code: 34037096 Description: 43838 - DEB SUBQ TISSUE 20 SQ CM/< Modifier: Quantity: 1 CPT4 Code: Description: ICD-10 Diagnosis Description E11.621 Type 2 diabetes mellitus with foot ulcer Modifier: Quantity: Physician Procedures CPT4 Code: 1840375 Description: 43606 - WC PHYS SUBQ TISS 20 SQ CM Modifier: Quantity: 1 CPT4 Code: Description: ICD-10 Diagnosis Description E11.621 Type 2 diabetes mellitus with foot ulcer Modifier: Quantity: Electronic Signature(s) Signed: 10/07/2021 3:09:43 PM By: Worthy Keeler PA-C Entered By: Worthy Keeler on 10/07/2021 15:09:42

## 2021-10-14 ENCOUNTER — Encounter: Payer: Medicare HMO | Admitting: Internal Medicine

## 2021-10-14 ENCOUNTER — Other Ambulatory Visit: Payer: Self-pay

## 2021-10-14 DIAGNOSIS — E11621 Type 2 diabetes mellitus with foot ulcer: Secondary | ICD-10-CM | POA: Diagnosis not present

## 2021-10-14 NOTE — Progress Notes (Signed)
BAYARD, MORE ([id number]) Visit Report for 10/14/2021 Arrival Information Details Patient Name: [name], [name]. Date of Service: 10/14/2021 10:30 AM Medical Record Number: [id number] Patient Account Number: [id number] Date of Birth/Sex: [birth year] (75 y.o. M) Treating RN: Levora Dredge Primary Care [name] [name]: Fulton Reek Other Clinician: Referring Havard Radigan: Fulton Reek Treating Dulcemaria Bula/Extender: Tito Dine in Treatment: 11 Visit Information History Since Last Visit Added or deleted any medications: No Patient Arrived: Cane Any new allergies or adverse reactions: No Arrival Time: 10:42 Had a fall or experienced change in No Accompanied By: self activities of daily living that may affect Transfer Assistance: None risk of falls: Patient Identification Verified: Yes Hospitalized since last visit: No Secondary Verification Process Completed: Yes Has Dressing in Place as Prescribed: Yes Patient Has Alerts: Yes Pain Present Now: No Patient Alerts: Patient on Blood Thinner Coumadin Diabetic aspirin 25m Electronic Signature(s) Signed: 10/14/2021 4:34:52 PM By: GLevora DredgeEntered By: GLevora Dredgeon 10/14/2021 10:47:22 [name]([id number] -------------------------------------------------------------------------------- Clinic Level of Care Assessment Details Patient Name: [name] Date of Service: 10/14/2021 10:30 AM Medical Record Number: [id number] Account Number: [id number] of Birth/Sex: 09/02/1946-04-26(75y.o. M) Treating RN: GLevora DredgePrimary Care Maritsa Hunsucker: SFulton ReekOther Clinician: Referring Rogerio Boutelle: SFulton ReekTreating Bonner Larue/Extender: RTito Dinein Treatment: 11 Clinic Level of Care Assessment Items TOOL 4 Quantity Score X - Use when only an EandM is performed on FOLLOW-UP visit 1 0 ASSESSMENTS - Nursing Assessment / Reassessment _0  - Reassessment of Co-morbidities  (includes updates in patient status) 0 _1  - 0 Reassessment of Adherence to Treatment Plan ASSESSMENTS - Wound and Skin Assessment / Reassessment X - Simple Wound Assessment / Reassessment - one wound 1 5 _2  - 0 Complex Wound Assessment / Reassessment - multiple wounds _3  - 0 Dermatologic / Skin Assessment (not related to wound area) ASSESSMENTS - Focused Assessment _4  - Circumferential Edema Measurements - multi extremities 0 _5  - 0 Nutritional Assessment / Counseling / Intervention _6  - 0 Lower Extremity Assessment (monofilament, tuning fork, pulses) _7  - 0 Peripheral Arterial Disease Assessment (using hand held doppler) ASSESSMENTS - Ostomy and/or Continence Assessment and Care _8  - Incontinence Assessment and Management 0 _9  - 0 Ostomy Care Assessment and Management (repouching, etc.) PROCESS - Coordination of Care X - Simple Patient / Family Education for ongoing care 1 15 _10  - 0 Complex (extensive) Patient / Family Education for ongoing care _11  - 0 Staff obtains CProgrammer, systems Records, Test Results / Process Orders _12  - 0 Staff telephones HHA, Nursing Homes / Clarify orders / etc _13  - 0 Routine Transfer to another Facility (non-emergent condition) _14  - 0 Routine Hospital Admission (non-emergent condition) _15  - 0 New Admissions / IBiomedical engineer/ Ordering NPWT, Apligraf, etc. _16  - 0 Emergency Hospital Admission (emergent condition) X- 1 10 Simple Discharge Coordination _17  - 0 Complex (extensive) Discharge Coordination PROCESS - Special Needs _18  - Pediatric / Minor Patient Management 0 _19  - 0 Isolation Patient Management _20  - 0 Hearing / Language / Visual special needs _21  - 0 Assessment of Community assistance (transportation, D/C planning, etc.) _22  - 0 Additional assistance / Altered mentation _23  - 0 Support Surface(s) Assessment (bed, cushion, seat, etc.) INTERVENTIONS - Wound Cleansing / Measurement FBRALON, ANTKOWIAK([id number] X- 1 5 Simple Wound  Cleansing - one wound _24  - 0 Complex Wound Cleansing - multiple wounds X- 1 5 Wound Imaging (photographs - any number of wounds) _25  - 0 Wound Tracing (instead of photographs) X-  1 5 Simple Wound Measurement - one wound _0  - 0 Complex Wound Measurement - multiple wounds INTERVENTIONS - Wound Dressings X - Small Wound Dressing one or multiple wounds 1 10 _1  - 0 Medium Wound Dressing one or multiple wounds _2  - 0 Large Wound Dressing one or multiple wounds <MWNUUVOZDGUYQIHK>_7<\/QQVZDGLOVFIEPPIR>_5  - 0 Application of Medications - topical <JOACZYSAYTKZSWFU>_9<\/NATFTDDUKGURKYHC>_6  - 0 Application of Medications - injection INTERVENTIONS - Miscellaneous _5  - External ear exam 0 _6  - 0 Specimen Collection (cultures, biopsies, blood, body fluids, etc.) _7  - 0 Specimen(s) / Culture(s) sent or taken to Lab for analysis _8  - 0 Patient Transfer (multiple staff / Civil Service fast streamer / Similar devices) _9  - 0 Simple Staple / Suture removal (25 or less) _10  - 0 Complex Staple / Suture removal (26 or more) _11  - 0 Hypo / Hyperglycemic Management (close monitor of Blood Glucose) _12  - 0 Ankle / Brachial Index (ABI) - do not check if billed separately X- 1 5 Vital Signs Has the patient been seen at the hospital within the last three years: Yes Total Score: 60 Level Of Care: New/Established - Level 2 Electronic Signature(s) Signed: 10/14/2021 4:34:52 PM By: Levora Dredge Entered By: Levora Dredge on 10/14/2021 11:22:31 [name] ([id number]) -------------------------------------------------------------------------------- Encounter Discharge Information Details Patient Name: [name], [name]. Date of Service: 10/14/2021 10:30 AM Medical Record Number: [id number] Patient Account Number: [id number] Date of Birth/Sex: 1946/07/08 (75 y.o. M) Treating RN: Levora Dredge Primary Care Skyelynn Rambeau: Fulton Reek Other Clinician: Referring Delcenia Inman: Fulton Reek Treating Keiland Pickering/Extender: Tito Dine in Treatment: 11 Encounter Discharge Information  Items Discharge Condition: Stable Ambulatory Status: Cane Discharge Destination: Home Transportation: Private Auto Accompanied By: self Schedule Follow-up Appointment: Yes Clinical Summary of Care: Electronic Signature(s) Signed: 10/14/2021 4:34:52 PM By: Levora Dredge Entered By: Levora Dredge on 10/14/2021 11:23:48 [name] ([id number]) -------------------------------------------------------------------------------- Lower Extremity Assessment Details Patient Name: [name], [name]. Date of Service: 10/14/2021 10:30 AM Medical Record Number: [id number] Patient Account Number: [id number] Date of Birth/Sex: 31-Mar-1946 (75 y.o. M) Treating RN: Levora Dredge Primary Care Kaleyah Labreck: Fulton Reek Other Clinician: Referring Charnee Turnipseed: Fulton Reek Treating Jozalynn Noyce/Extender: Tito Dine in Treatment: 11 Vascular Assessment Pulses: Dorsalis Pedis Doppler Audible: [Left:Yes] Electronic Signature(s) Signed: 10/14/2021 4:34:52 PM By: Levora Dredge Entered By: Levora Dredge on 10/14/2021 11:02:03 [name], [name] ([id number]) -------------------------------------------------------------------------------- Multi Wound Chart Details Patient Name: [name], [name]. Date of Service: 10/14/2021 10:30 AM Medical Record Number: [id number] Patient Account Number: [id number] Date of Birth/Sex: 1946/06/13 (75 y.o. M) Treating RN: Levora Dredge Primary Care Roosvelt Churchwell: Fulton Reek Other Clinician: Referring Claretta Kendra: Fulton Reek Treating Areanna Gengler/Extender: Tito Dine in Treatment: 11 Vital Signs Height(in): 15 Pulse(bpm): 59 Weight(lbs): 24 Blood Pressure(mmHg): 129/67 Body Mass Index(BMI): 39 Temperature(F): 98.2 Respiratory Rate(breaths/min): 18 Photos: [N/A:N/A] Wound Location: Left Calcaneus N/A N/A Wounding Event: Gradually Appeared N/A N/A Primary Etiology: Diabetic Wound/Ulcer of the Lower N/A N/A Extremity Comorbid  History: Cataracts, Arrhythmia, Coronary N/A N/A Artery Disease, Hypertension, Type II Diabetes, Osteoarthritis, Neuropathy Date Acquired: 07/07/2021 N/A N/A Weeks of Treatment: 11 N/A N/A Wound Status: Open N/A N/A Measurements L x W x D (cm) 1.3x1.2x0.1 N/A N/A Area (cm) : 1.225 N/A N/A Volume (cm) : 0.123 N/A N/A % Reduction in Area: 44.30% N/A N/A % Reduction in Volume: 44.10% N/A N/A Classification: Grade 2 N/A N/A Exudate Amount: Large N/A N/A Exudate Type: Purulent N/A N/A Exudate Color: yellow, brown, green N/A N/A Foul Odor After Cleansing: Yes N/A N/A Odor Anticipated Due to Product No  N/A N/A Use: Wound Margin: Thickened N/A N/A Granulation Amount: Medium (34-66%) N/A N/A Granulation Quality: Red, Pink N/A N/A Necrotic Amount: Medium (34-66%) N/A N/A Exposed Structures: Fat Layer (Subcutaneous Tissue): N/A N/A Yes Fascia: No Tendon: No Muscle: No Joint: No Bone: No Epithelialization: Small (1-33%) N/A N/A Treatment Notes Electronic Signature(s) Signed: 10/14/2021 4:34:52 PM By: Ferman Hamming ([id number]) Entered By: Levora Dredge on 10/14/2021 11:08:02 [name] ([id number]) -------------------------------------------------------------------------------- [place] Details Patient Name: [name], [name]. Date of Service: 10/14/2021 10:30 AM Medical Record Number: [id number] Patient Account Number: [id number] Date of Birth/Sex: 1946/05/11 (75 y.o. M) Treating RN: Levora Dredge Primary Care Murrel Freet: Fulton Reek Other Clinician: Referring Cecillia Menees: Fulton Reek Treating Laverne Hursey/Extender: Tito Dine in Treatment: 11 Active Inactive Wound/Skin Impairment Nursing Diagnoses: Impaired tissue integrity Knowledge deficit related to smoking impact on wound healing Knowledge deficit related to ulceration/compromised skin integrity Goals: Patient/caregiver will verbalize understanding of skin  care regimen Date Initiated: 07/28/2021 Date Inactivated: 08/11/2021 Target Resolution Date: 08/08/2021 Goal Status: Met Ulcer/skin breakdown will have a volume reduction of 30% by week 4 Date Initiated: 07/28/2021 Target Resolution Date: 08/28/2021 Goal Status: Active Ulcer/skin breakdown will have a volume reduction of 50% by week 8 Date Initiated: 07/28/2021 Target Resolution Date: 09/27/2021 Goal Status: Active Ulcer/skin breakdown will have a volume reduction of 80% by week 12 Date Initiated: 07/28/2021 Target Resolution Date: 10/28/2020 Goal Status: Active Ulcer/skin breakdown will heal within 14 weeks Date Initiated: 07/28/2021 Target Resolution Date: 11/12/2020 Goal Status: Active Interventions: Assess patient/caregiver ability to obtain necessary supplies Assess patient/caregiver ability to perform ulcer/skin care regimen upon admission and as needed Assess ulceration(s) every visit Notes: Electronic Signature(s) Signed: 10/14/2021 4:34:52 PM By: Levora Dredge Entered By: Levora Dredge on 10/14/2021 11:07:42 [name] ([id number]) -------------------------------------------------------------------------------- Pain Assessment Details Patient Name: [name], [name]. Date of Service: 10/14/2021 10:30 AM Medical Record Number: [id number] Patient Account Number: [id number] Date of Birth/Sex: 1946/09/11 (75 y.o. M) Treating RN: Levora Dredge Primary Care Genevive Printup: Fulton Reek Other Clinician: Referring Gabreille Dardis: Fulton Reek Treating Hyla Coard/Extender: Tito Dine in Treatment: 11 Active Problems Location of Pain Severity and Description of Pain Patient Has Paino No Site Locations Rate the pain. Current Pain Level: 0 Pain Management and Medication Current Pain Management: Electronic Signature(s) Signed: 10/14/2021 4:34:52 PM By: Levora Dredge Entered By: Levora Dredge on 10/14/2021 10:49:08 [name]  ([id number]) -------------------------------------------------------------------------------- Patient/Caregiver Education Details Patient Name: [name], [name]. Date of Service: 10/14/2021 10:30 AM Medical Record Number: [id number] Patient Account Number: [id number] Date of Birth/Gender: [birth year] (75 y.o. M) Treating RN: Levora Dredge Primary Care Physician: Fulton Reek Other Clinician: Referring Physician: Fulton Reek Treating Physician/Extender: Tito Dine in Treatment: 11 Education Assessment Education Provided To: Patient Education Topics Provided Wound/Skin Impairment: Handouts: Caring for Your Ulcer Methods: Explain/Verbal Responses: State content correctly Electronic Signature(s) Signed: 10/14/2021 4:34:52 PM By: Levora Dredge Entered By: Levora Dredge on 10/14/2021 11:23:02 [name], [name] ([id number]) -------------------------------------------------------------------------------- Wound Assessment Details Patient Name: [name], [name]. Date of Service: 10/14/2021 10:30 AM Medical Record Number: [id number] Patient Account Number: [id number] Date of Birth/Sex: 1945/12/27 (75 y.o. M) Treating RN: Levora Dredge Primary Care Amnah Breuer: Fulton Reek Other Clinician: Referring Farooq Petrovich: Fulton Reek Treating Stylianos Stradling/Extender: Tito Dine in Treatment: 11 Wound Status Wound Number: 9 Primary Diabetic Wound/Ulcer of the Lower Extremity Etiology: Wound Location: Left Calcaneus Wound Open Wounding Event: Gradually Appeared Status: Date Acquired: 07/07/2021 Comorbid Cataracts, Arrhythmia, Coronary Artery Disease, Weeks Of Treatment: 11  History: Hypertension, Type II Diabetes, Osteoarthritis, Clustered Wound: No Neuropathy Photos Wound Measurements Length: (cm) 1.3 Width: (cm) 1.2 Depth: (cm) 0.1 Area: (cm) 1.225 Volume: (cm) 0.123 % Reduction in Area: 44.3% % Reduction in Volume: 44.1% Epithelialization: Small  (1-33%) Tunneling: No Undermining: No Wound Description Classification: Grade 2 Wound Margin: Thickened Exudate Amount: Large Exudate Type: Purulent Exudate Color: yellow, brown, green Foul Odor After Cleansing: Yes Due to Product Use: No Slough/Fibrino Yes Wound Bed Granulation Amount: Medium (34-66%) Exposed Structure Granulation Quality: Red, Pink Fascia Exposed: No Necrotic Amount: Medium (34-66%) Fat Layer (Subcutaneous Tissue) Exposed: Yes Necrotic Quality: Adherent Slough Tendon Exposed: No Muscle Exposed: No Joint Exposed: No Bone Exposed: No Treatment Notes Wound #9 (Calcaneus) Wound Laterality: Left Cleanser Normal Saline Discharge Instruction: Wash your hands with soap and water. Remove old dressing, discard into plastic bag and place into trash. Cleanse the wound with Normal Saline prior to applying a clean dressing using gauze sponges, not tissues or cotton balls. Do not [name], [name] ([id number]) scrub or use excessive force. Pat dry using gauze sponges, not tissue or cotton balls. Peri-Wound Care Topical Primary Dressing Silvercel Small 2x2 (in/in) Discharge Instruction: Apply Silvercel Small 2x2 (in/in) as instructed Secondary Dressing ABD Pad 5x9 (in/in) Discharge Instruction: Cover with ABD pad heel cup Discharge Instruction: apply over dressing to protect heel Secured With 22M Medipore H Soft Cloth Surgical Tape, 2x2 (in/yd) Coban Cohesive Bandage 4x5 (yds) Stretched Discharge Instruction: Apply Coban as directed. Kerlix Roll Sterile or Non-Sterile 6-ply 4.5x4 (yd/yd) Discharge Instruction: Apply Kerlix as directed Compression Wrap Compression Stockings Add-Ons Prevalon boot Discharge Instruction: use while in bed Electronic Signature(s) Signed: 10/14/2021 4:34:52 PM By: Levora Dredge Entered By: Levora Dredge on 10/14/2021 10:59:47 [name]  ([id number]) -------------------------------------------------------------------------------- Vitals Details Patient Name: [name], JAKOB. Date of Service: 10/14/2021 10:30 AM Medical Record Number: [id number] Patient Account Number: [id number] Date of Birth/Sex: 1946-07-09 (75 y.o. M) Treating RN: Levora Dredge Primary Care Ivie Maese: Fulton Reek Other Clinician: Referring Genoveva Singleton: Fulton Reek Treating Elisea Khader/Extender: Tito Dine in Treatment: 11 Vital Signs Time Taken: 10:47 Temperature (F): 98.2 Height (in): 69 Pulse (bpm): 59 Weight (lbs): 267 Respiratory Rate (breaths/min): 18 Body Mass Index (BMI): 39.4 Blood Pressure (mmHg): 129/67 Reference Range: 80 - 120 mg / dl Electronic Signature(s) Signed: 10/14/2021 4:34:52 PM By: Levora Dredge Entered By: Levora Dredge on 10/14/2021 10:48:50

## 2021-10-14 NOTE — Progress Notes (Signed)
GARDINER, ESPANA ([id number]) Visit Report for 10/14/2021 HPI Details Patient Name: [name], [name]. Date of Service: 10/14/2021 10:30 AM Medical Record Number: [id number] Patient Account Number: [id number] Date of Birth/Sex: [birth year] (75 y.o. M) Treating RN: [name] Primary Care Provider: Fulton Reek Other Clinician: Referring Provider: Fulton Reek Treating Provider/Extender: Tito Dine in Treatment: 11 History of Present Illness HPI Description: 09/24/2020 on evaluation today patient presents today for a heel ulcer that he tells me has been present for about 2 years. He has been seeing podiatry and they have been attempting to manage this including what sounds to be a total contact cast, Unna boot, and just standard dressings otherwise as well. Most recently has been using triple antibiotic ointment. With that being said unfortunately despite everything he really has not had any significant improvement. He tells me that he cannot even really remember exactly how this began but he presumed it may have rubbed on his shoes or something of that nature. 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 He tells me that this does seem to help. Fortunately there is no signs of active infection at this time. His most recent hemoglobin A1c was 8.1 though he knows this was this year he cannot tell me the exact time. His fluid pills currently to help with some of the lower extremity edema although he does obviously have signs of venous stasis/lymphedema. Currently there is no evidence of active infection. No fevers, chills, nausea, vomiting, or diarrhea. Patient has had fairly recent ABIs which were  performed on 07/19/2020 and revealed that he has normal findings in both the ankle and toe locations bilaterally. His ABI on the right was 1.09 on the left was 1.08 with a TBI on the right of 0.88 and on the left of 0.94. Triphasic flow was noted throughout. 10/08/2020 on evaluation today patient appears to be doing pretty well in regard to his left heel currently in fact this is doing a great job and seems to be healing quite nicely. Unfortunately on his right leg he had a pile of wood that actually fell on him injuring his right leg this is somewhat erythematous has me concerned little bit about cellulitis though there is not really a good area to culture at this point. 10/24/2020 upon evaluation today patient appears to be doing well with regard to his heel ulcer. He is showing signs of improvement which is great news. His right leg is completely healed. Overall I feel like he is doing excellent and there is no signs of infection. 11/07/2020 upon evaluation today patient appears to be doing well with regard to his heel ulcer. He tells me that last week when he was unable to come in his wife actually thought that the wound was very close to closing if not closed. Then it began to "reopen again". I really feel like what may have happened as the collagen may have dried over the wound bed and that because that misunderstanding with thinking that the wound was healing. With that being said I did not see it last week I do not know that for certain. Either way I feel like he is doing great today I see no signs of infection  at this point. 11/26/2020 upon inspection today patient appears to be doing decently well in regard to his heel ulcer. He has been tolerating the dressing changes without complication. Fortunately there is no sign of active infection at this time. No fevers, chills, nausea, vomiting, or diarrhea. 12/10/2020 upon evaluation today patient appears to be doing fairly well in regard to the wound on  his heel as well as what appears to be a new wound of the left first metatarsal head plantar aspect. This seems to be an area that was callus that has split as the patient tells me has been trying to walk on his toes more has probably where this came from. With that being said there does not appear to be signs of active infection which is great news. 12/17/2020 upon evaluation today patient actually appears to be making good progress currently. Fortunately there is no evidence of active infection at this time. Overall I feel like he is very close to complete closure. 12/26/2020 upon evaluation today patient appears to be doing excellent in regard to his wounds. In fact I am not certain that these are not even completely healed on initial inspection. Overall I am very pleased with where things stand today. Good news is that they are healed he is actually get ready to go out of town and that will be helpful as well as he will be a full part of the time. Readmission: 02/04/2021 upon evaluation today patient appears to be doing well at this point in regard to his left heel that I previously saw him for. Unfortunately he is having issues with his right lower extremity. He has significant wounds at this point he also has erythema noted there is definite signs of cellulitis which is unfortunate. With that being said I think we do need to address this sooner rather than later. The good news is he did have a nice trip to the beach. He tells me that he had no issues during that time. 4/27; patient on Bactrim. Culture showed methicillin sensitive staph aureus therefore the Bactrim should be effective. 2 small areas on the right leg are healed the area on the mid aspect of the tibia almost 100% covered in a very adherent necrotic debris. We have been using silver alginate 02/20/2021 upon evaluation today patient appears to be doing well with regard to his wound. He is showing signs of improvement which is great news  overall very pleased with where things stand today. No fevers, chills, nausea, vomiting, or diarrhea. 02/27/2021 upon evaluation today patient appears to be doing well with regard to his leg ulcer. He is tolerating the dressing changes and overall appears to be doing quite excellent. I am extremely pleased with where things stand and overall I think patient is making great progress. There is no sign of active infection at this time which is also great news. 03/06/2021 upon evaluation today patient appears to be doing excellent in regard to his wounds. In fact he appears to be completely healed today based on what I am seeing. This is excellent news and overall I am extremely pleased with where he stands. Overall the patient is happy to hear this as well this has been quite sometime coming. [name], [name] ([id number]) Readmission: 04/01/2021 patient unfortunately returns for readmission today. He tells me that he has been wearing his compression socks on the right leg daily and he does not really know what is going on and why his legs are doing what they  are doing. We did therefore go ahead and probe deeper into exactly what has been going on with him today. Subsequently the patient tells me that when he gets up and what we would call "first thing in the morning" are really 2 different things". He does not tend to sleep well so he tells me that he will often wake up at 3:00 in the morning. He will then potentially going into the living room to get in his chair where he may read a book for a little while and then potentially fall asleep back in his chair. He then subsequently wake up around 5:00 or so and then get up and in his words "putter around". This often will end with him proceeding at some point around 8 AM or 9 AM to putting on his compression socks. With that being said this means that anywhere from the 3:00 in the morning till roughly around 9:00 in the morning he has no compression on yet  he is sitting in his recliner, walking around and up and about, and this is at least 5 to 6 hours of noncompressed time. That may be our issue here but I did not realize until we discussed this further today. Fortunately there does not appear to be any signs of active infection at this time which is great news. With that being said he has multiple wounds of the bilateral lower extremities. 04/10/2021 upon evaluation today patient appears to be doing well with regard to his legs for the most part. He does look like he had some injury where his wrap slid down nonetheless he did some "doctoring on them". I do feel like most of the areas honestly have cleared back up which is great news. There does not appear to be any signs of active infection at this time which is also great news. No fevers, chills, nausea, vomiting, or diarrhea. 04/18/2021 upon evaluation today patient appears to be doing well with regard to his wounds in fact he appears to be completely healed which is great news. Fortunately there does not appear to be any signs of active infection which is great news. No fevers, chills, nausea, vomiting, or diarrhea. READMISSION 07/28/2021 This is a now 75 year old man we have had in this clinic for quite a bit of this year. 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 He wears compression stockings. 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 He comes in an old running shoes although he says he wears better shoes at home. His history is that he noticed a blister in the right posterior heel. This open. He has been applying Neosporin to it currently the wound measures 2 x 1.4 cm. 100% slough covered. Not really offloading this in any rigorous way. Past medical history is essentially unchanged he has paroxysmal atrial fibrillation on Coumadin type 2 diabetes with a recent hemoglobin A1c of 7 on  metformin. ABI in our clinic was 0.98 on the right 08/05/2021 upon evaluation today patient appears to be doing well with regard to his heel ulcer. Fortunately there does not appear to be any signs of active infection at this time. Overall I been very pleased with where things seem to be currently as far as the wound healing is concerned. Again this is first a lot of seeing him Dr. Dellia Nims readmitted him last week. Nonetheless I think we are definitely making some progress here. 08/11/2021 upon evaluation  today patient's wound on the heel actually showing signs of excellent improvement. I am actually very pleased with where things stand currently. No fevers, chills, nausea, vomiting, or diarrhea. There does not appear to be any need for sharp debridement today either which is also great news. 08/25/2021 upon evaluation today patient appears to be doing well with regard to his heel ulcer. This is actually looking significantly improved compared to last time I saw him. Fortunately there does not appear to be any evidence of active infection at this time. No fevers, chills, nausea, vomiting, or diarrhea. 09/01/2021 upon evaluation today patient appears to be doing decently well in regard to his wounds. Fortunately there does not appear to be any signs of active infection at this time which is great news and overall very pleased in that regard. I do not see any evidence of active infection systemically which is great news as well. No fevers, chills, nausea, vomiting, or diarrhea. I think that the heel is making excellent progress. 09/15/2021 upon evaluation today patient's heel unfortunately is significantly worse compared to what it was previous. I do believe that at this time he would benefit from switching back to something a little bit more offloading from the cushion shoe he has right now this is more of a heel protector what I really need is a Prevalon offloading boot which I think is good to do a  lot better for him than just the small heel protector. 09/23/2021 upon evaluation today patient appears to be doing a little better in regards to last week's visit. Overall I think that he is making good progress which is great news and there does not appear to be any evidence of active infection at this time. No fevers, chills, nausea, vomiting, or diarrhea. 09/30/2021 upon evaluation patient's heel is actually showing signs of good improvement which is great news. Fortunately there does not appear to be any evidence of active infection locally nor systemically at this point. No fevers, chills, nausea, vomiting, or diarrhea. 10/07/2021 upon evaluation today patient appears to be doing well currently in regard to his heel ulcer. He has been tolerating the dressing changes without complication. Fortunately I do not see any signs of active infection at this time which is great news. No fevers, chills, nausea, vomiting, or diarrhea. 12/27; wound is measuring smaller. We have been using silver alginate Electronic Signature(s) Signed: 10/14/2021 4:21:37 PM By: Linton Ham MD Entered By: Linton Ham on 10/14/2021 11:14:30 [name] ([id number]) -------------------------------------------------------------------------------- Physical Exam Details Patient Name: [name], [name]. Date of Service: 10/14/2021 10:30 AM Medical Record Number: [id number] Patient Account Number: [id number] Date of Birth/Sex: 01/19/1946 (75 y.o. M) Treating RN: [name] Primary Care Provider: Fulton Reek Other Clinician: Referring Provider: Fulton Reek Treating Provider/Extender: Tito Dine in Treatment: 11 Constitutional Sitting or standing Blood Pressure is within target range for patient.. Pulse regular and within target range for patient.Marland Kitchen Respirations regular, non- labored and within target range.. Temperature is normal and within the target range for the patient.Marland Kitchen appears in no  distress. Cardiovascular Pedal pulses palpable. Notes Wound exam; the wound really looks quite healthy. Superficial granulation and epithelialization. No debridement Electronic Signature(s) Signed: 10/14/2021 4:21:37 PM By: Linton Ham MD Entered By: Linton Ham on 10/14/2021 11:15:08 [name] ([id number]) -------------------------------------------------------------------------------- Physician Orders Details Patient Name: WILLOUGHBY, DOELL. Date of Service: 10/14/2021 10:30 AM Medical Record Number: [id number] Patient Account Number: [id number] Date of Birth/Sex: [birth year] (75 y.o. M) Treating RN: Levora Dredge Primary  Care Provider: Fulton Reek Other Clinician: Referring Provider: Fulton Reek Treating Provider/Extender: Tito Dine in Treatment: 20 Verbal / Phone Orders: No Diagnosis Coding Follow-up Appointments o Return Appointment in 1 week. o Nurse Visit as needed - call to schedule Bathing/ Shower/ Hygiene o May shower with wound dressing protected with water repellent cover or cast protector. o No tub bath. Edema Control - Lymphedema / Segmental Compressive Device / Other o Patient to wear own compression stockings. Remove compression stockings every night before going to bed and put on every morning when getting up. - right leg o Elevate leg(s) parallel to the floor when sitting. o DO YOUR BEST to sleep in the bed at night. DO NOT sleep in your recliner. Long hours of sitting in a recliner leads to swelling of the legs and/or potential wounds on your backside. Off-Loading o Other: - L heel offloader shoe Keep pressure off of heel in the bed-float heel in [place] order from [employer], see sheet Additional Orders / Instructions o Follow Nutritious Diet and Increase Protein Intake - check blood sugar and eat protein for wound healing Wound Treatment Wound #9 - Calcaneus Wound Laterality: Left Cleanser:  Normal Saline Every Other Day/15 Days Discharge Instructions: Wash your hands with soap and water. Remove old dressing, discard into plastic bag and place into trash. Cleanse the wound with Normal Saline prior to applying a clean dressing using gauze sponges, not tissues or cotton balls. Do not scrub or use excessive force. Pat dry using gauze sponges, not tissue or cotton balls. Primary Dressing: Silvercel Small 2x2 (in/in) (Generic) Every Other Day/15 Days Discharge Instructions: Apply Silvercel Small 2x2 (in/in) as instructed Secondary Dressing: ABD Pad 5x9 (in/in) Every Other Day/15 Days Discharge Instructions: Cover with ABD pad Secondary Dressing: heel cup Every Other Day/15 Days Discharge Instructions: apply over dressing to protect heel Secured With: 40M Medipore H Soft Cloth Surgical Tape, 2x2 (in/yd) (Generic) Every Other Day/15 Days Secured With: Coban Cohesive Bandage 4x5 (yds) Stretched Every Other Day/15 Days Discharge Instructions: Apply Coban as directed. Secured With: [employer] or Non-Sterile 6-ply 4.5x4 (yd/yd) (Generic) Every Other Day/15 Days Discharge Instructions: Apply Kerlix as directed Add-Ons: Prevalon boot Every Other Day/15 Days Discharge Instructions: use while in bed Electronic Signature(s) Signed: 10/14/2021 4:21:37 PM By: Linton Ham MD Signed: 10/14/2021 4:34:52 PM By: Levora Dredge Entered By: Levora Dredge on 10/14/2021 11:09:51 [name], [name] ([id number]) [name], [name] ([id number]) -------------------------------------------------------------------------------- Problem List Details Patient Name: RYATT, CORSINO. Date of Service: 10/14/2021 10:30 AM Medical Record Number: [id number] Patient Account Number: [id number] Date of Birth/Sex: Mar 10, 1946 (75 y.o. 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 Date of Service: 10/14/2021 10:30 AM Medical Record Number: [id number] Patient Account Number: [id number] Date of Birth/Sex: 1946-04-17 (75 y.o. M) Treating RN: [name] Primary Care Provider: Fulton Reek Other Clinician: Referring Provider: Fulton Reek Treating Provider/Extender: Tito Dine in  Treatment: 11 Subjective History of Present Illness (HPI) 09/24/2020 on evaluation today patient presents today for a heel ulcer that he tells me has been present for about 2 years. He has been seeing podiatry and they have been attempting to manage this including what sounds to be a total contact cast, Unna boot, and just standard dressings otherwise as well. Most recently has been using triple antibiotic ointment. With that being said unfortunately despite everything he really has not had any significant improvement. He tells me that he cannot even really remember exactly how this began but he presumed it may have rubbed on his shoes or something of that nature. 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 He also does have chronic pain in the way  of neuropathy which he takes medications for including Cymbalta and methadone. He tells me that this does seem to help. Fortunately there is no signs of active infection at this time. His most recent hemoglobin A1c was 8.1 though he knows this was this year he cannot tell me the exact time. His fluid pills currently to help with some of the lower extremity edema although he does obviously have signs of venous stasis/lymphedema. Currently there is no evidence of active infection. No fevers, chills, nausea, vomiting, or diarrhea. Patient has had fairly recent ABIs which were performed on 07/19/2020 and revealed that he has normal findings in both the ankle and toe locations bilaterally. His ABI on the right was 1.09 on the left was 1.08 with a TBI on the right of 0.88 and on the left of 0.94. Triphasic flow was noted throughout. 10/08/2020 on evaluation today patient appears to be doing pretty well in regard to his left heel currently in fact this is doing a great job and seems to be healing quite nicely. Unfortunately on his right leg he had a pile of wood that actually fell on him injuring his right leg this is somewhat erythematous has me concerned little bit about cellulitis though there is not really a good area to culture at this point. 10/24/2020 upon evaluation today patient appears to be doing well with regard to his heel ulcer. He is showing signs of improvement which is great news. His right leg is completely healed. Overall I feel like he is doing excellent and there is no signs of infection. 11/07/2020 upon evaluation today patient appears to be doing well with regard to his heel ulcer. He tells me that last week when he was unable to come in his wife actually thought that the wound was very close to closing if not closed. Then it began to "reopen again". I really feel like what may have happened as the collagen may have dried over the wound bed and that because that misunderstanding with  thinking that the wound was healing. With that being said I did not see it last week I do not know that for certain. Either way I feel like he is doing great today I see no signs of infection at this point. 11/26/2020 upon inspection today patient appears to be doing decently well in regard to his heel ulcer. He has been tolerating the dressing changes without complication. Fortunately there is no sign of active infection at this time. No fevers, chills, nausea, vomiting, or diarrhea. 12/10/2020 upon evaluation today patient appears to be doing fairly  well in regard to the wound on his heel as well as what appears to be a new wound of the left first metatarsal head plantar aspect. This seems to be an area that was callus that has split as the patient tells me has been trying to walk on his toes more has probably where this came from. With that being said there does not appear to be signs of active infection which is great news. 12/17/2020 upon evaluation today patient actually appears to be making good progress currently. Fortunately there is no evidence of active infection at this time. Overall I feel like he is very close to complete closure. 12/26/2020 upon evaluation today patient appears to be doing excellent in regard to his wounds. In fact I am not certain that these are not even completely healed on initial inspection. Overall I am very pleased with where things stand today. Good news is that they are healed he is actually get ready to go out of town and that will be helpful as well as he will be a full part of the time. Readmission: 02/04/2021 upon evaluation today patient appears to be doing well at this point in regard to his left heel that I previously saw him for. Unfortunately he is having issues with his right lower extremity. He has significant wounds at this point he also has erythema noted there is definite signs of cellulitis which is unfortunate. With that being said I think we do need  to address this sooner rather than later. The good news is he did have a nice trip to the beach. He tells me that he had no issues during that time. 4/27; patient on Bactrim. Culture showed methicillin sensitive staph aureus therefore the Bactrim should be effective. 2 small areas on the right leg are healed the area on the mid aspect of the tibia almost 100% covered in a very adherent necrotic debris. We have been using silver alginate 02/20/2021 upon evaluation today patient appears to be doing well with regard to his wound. He is showing signs of improvement which is great news overall very pleased with where things stand today. No fevers, chills, nausea, vomiting, or diarrhea. 02/27/2021 upon evaluation today patient appears to be doing well with regard to his leg ulcer. He is tolerating the dressing changes and overall appears to be doing quite excellent. I am extremely pleased with where things stand and overall I think patient is making great progress. There is no sign of active infection at this time which is also great news. 03/06/2021 upon evaluation today patient appears to be doing excellent in regard to his wounds. In fact he appears to be completely healed today based on what I am seeing. This is excellent news and overall I am extremely pleased with where he stands. Overall the patient is happy to hear this as well this has been quite sometime coming. Readmission: 04/01/2021 patient unfortunately returns for readmission today. He tells me that he has been wearing his compression socks on the right leg daily [name], [name] ([id number]) and he does not really know what is going on and why his legs are doing what they are doing. We did therefore go ahead and probe deeper into exactly what has been going on with him today. Subsequently the patient tells me that when he gets up and what we would call "first thing in the morning" are really 2 different things". He does not tend to sleep well  so he tells me that  he will often wake up at 3:00 in the morning. He will then potentially going into the living room to get in his chair where he may read a book for a little while and then potentially fall asleep back in his chair. He then subsequently wake up around 5:00 or so and then get up and in his words "putter around". This often will end with him proceeding at some point around 8 AM or 9 AM to putting on his compression socks. With that being said this means that anywhere from the 3:00 in the morning till roughly around 9:00 in the morning he has no compression on yet he is sitting in his recliner, walking around and up and about, and this is at least 5 to 6 hours of noncompressed time. That may be our issue here but I did not realize until we discussed this further today. Fortunately there does not appear to be any signs of active infection at this time which is great news. With that being said he has multiple wounds of the bilateral lower extremities. 04/10/2021 upon evaluation today patient appears to be doing well with regard to his legs for the most part. He does look like he had some injury where his wrap slid down nonetheless he did some "doctoring on them". I do feel like most of the areas honestly have cleared back up which is great news. There does not appear to be any signs of active infection at this time which is also great news. No fevers, chills, nausea, vomiting, or diarrhea. 04/18/2021 upon evaluation today patient appears to be doing well with regard to his wounds in fact he appears to be completely healed which is great news. Fortunately there does not appear to be any signs of active infection which is great news. No fevers, chills, nausea, vomiting, or diarrhea. READMISSION 07/28/2021 This is a now 75 year old man we have had in this clinic for quite a bit of this year. 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 He wears compression stockings. 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 He comes in an old running shoes although he says he wears better shoes at home. His history is that he noticed a blister in the right posterior heel. This open. He has been applying Neosporin to it currently the wound measures 2 x 1.4 cm. 100% slough covered. Not really offloading this in any rigorous way. Past medical history is essentially unchanged he has paroxysmal atrial fibrillation on Coumadin type 2 diabetes with a recent hemoglobin A1c of 7 on metformin. ABI in our clinic was 0.98 on the right 08/05/2021 upon evaluation today patient appears to be doing well with regard to his heel ulcer. Fortunately there does not appear to be any signs of active infection at this time. Overall I been very pleased with where things seem to be currently as far as the wound healing is concerned. Again this is first a lot of seeing him Dr. Dellia Nims readmitted him last week. Nonetheless I think we are definitely making some progress here. 08/11/2021 upon evaluation today patient's wound on the heel actually showing signs of excellent improvement. I am actually very pleased with where things stand currently. No fevers, chills, nausea, vomiting, or diarrhea. There does not appear to be any need for sharp debridement today either which is also great news. 08/25/2021 upon evaluation today patient appears to be doing well with  regard to his heel ulcer. This is actually looking significantly improved compared to last time I saw him. Fortunately there does not appear to be any evidence of active infection at this time. No fevers, chills, nausea, vomiting, or diarrhea. 09/01/2021 upon evaluation today patient appears to be doing decently well in regard to his wounds. Fortunately there does not appear to be any signs of active infection at this time which is great news and overall very pleased  in that regard. I do not see any evidence of active infection systemically which is great news as well. No fevers, chills, nausea, vomiting, or diarrhea. I think that the heel is making excellent progress. 09/15/2021 upon evaluation today patient's heel unfortunately is significantly worse compared to what it was previous. I do believe that at this time he would benefit from switching back to something a little bit more offloading from the cushion shoe he has right now this is more of a heel protector what I really need is a Prevalon offloading boot which I think is good to do a lot better for him than just the small heel protector. 09/23/2021 upon evaluation today patient appears to be doing a little better in regards to last week's visit. Overall I think that he is making good progress which is great news and there does not appear to be any evidence of active infection at this time. No fevers, chills, nausea, vomiting, or diarrhea. 09/30/2021 upon evaluation patient's heel is actually showing signs of good improvement which is great news. Fortunately there does not appear to be any evidence of active infection locally nor systemically at this point. No fevers, chills, nausea, vomiting, or diarrhea. 10/07/2021 upon evaluation today patient appears to be doing well currently in regard to his heel ulcer. He has been tolerating the dressing changes without complication. Fortunately I do not see any signs of active infection at this time which is great news. No fevers, chills, nausea, vomiting, or diarrhea. 12/27; wound is measuring smaller. We have been using silver alginate Objective Constitutional [name], [name]. ([id number]) Sitting or standing Blood Pressure is within target range for patient.. Pulse regular and within target range for patient.Marland Kitchen Respirations regular, non- labored and within target range.. Temperature is normal and within the target range for the patient.Marland Kitchen appears in no  distress. Vitals Time Taken: 10:47 AM, Height: 69 in, Weight: 267 lbs, BMI: 39.4, Temperature: 98.2 F, Pulse: 59 bpm, Respiratory Rate: 18 breaths/min, Blood Pressure: 129/67 mmHg. Cardiovascular Pedal pulses palpable. General Notes: Wound exam; the wound really looks quite healthy. Superficial granulation and epithelialization. No debridement Integumentary (Hair, Skin) Wound #9 status is Open. Original cause of wound was Gradually Appeared. The date acquired was: 07/07/2021. The wound has been in treatment 11 weeks. The wound is located on the Left Calcaneus. The wound measures 1.3cm length x 1.2cm width x 0.1cm depth; 1.225cm^2 area and 0.123cm^3 volume. There is Fat Layer (Subcutaneous Tissue) exposed. There is no tunneling or undermining noted. There is a large amount of purulent drainage noted. Foul odor after cleansing was noted. The wound margin is thickened. There is medium (34-66%) red, pink granulation within the wound bed. There is a medium (34-66%) amount of necrotic tissue within the wound bed including Adherent Slough. 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 Nurse Visit as needed -  call to schedule Bathing/ Shower/ Hygiene: May shower with wound dressing protected with water repellent cover or cast protector. No tub bath. Edema Control - Lymphedema / Segmental Compressive Device / Other: Patient to wear own compression stockings. Remove compression stockings every night before going to bed and put on every morning when getting up. - right leg Elevate leg(s) parallel to the floor when sitting. DO YOUR BEST to sleep in the bed at night. DO NOT sleep in your recliner. Long hours of sitting in a recliner leads to swelling of the legs and/or potential  wounds on your backside. Off-Loading: Other: - L heel offloader shoe Keep pressure off of heel in the bed-float heel in Binger order from [employer], see sheet Additional Orders / Instructions: Follow Nutritious Diet and Increase Protein Intake - check blood sugar and eat protein for wound healing WOUND #9: - Calcaneus Wound Laterality: Left Cleanser: Normal Saline Every Other Day/15 Days Discharge Instructions: Wash your hands with soap and water. Remove old dressing, discard into plastic bag and place into trash. Cleanse the wound with Normal Saline prior to applying a clean dressing using gauze sponges, not tissues or cotton balls. Do not scrub or use excessive force. Pat dry using gauze sponges, not tissue or cotton balls. Primary Dressing: Silvercel Small 2x2 (in/in) (Generic) Every Other Day/15 Days Discharge Instructions: Apply Silvercel Small 2x2 (in/in) as instructed Secondary Dressing: ABD Pad 5x9 (in/in) Every Other Day/15 Days Discharge Instructions: Cover with ABD pad Secondary Dressing: heel cup Every Other Day/15 Days Discharge Instructions: apply over dressing to protect heel Secured With: 39M Medipore H Soft Cloth Surgical Tape, 2x2 (in/yd) (Generic) Every Other Day/15 Days Secured With: Coban Cohesive Bandage 4x5 (yds) Stretched Every Other Day/15 Days Discharge Instructions: Apply Coban as directed. Secured With: [employer] or Non-Sterile 6-ply 4.5x4 (yd/yd) (Generic) Every Other Day/15 Days Discharge Instructions: Apply Kerlix as directed Add-Ons: Prevalon boot Every Other Day/15 Days Discharge Instructions: use while in bed [name], [name]. ([id number]) 1. I did not change the primary dressing which is silver alginate 2. This actually looks close to closing. 3. Using a heel offloading boot fairly religiously. He also has a protection boot he wears in bed at night. Electronic Signature(s) Signed: 10/14/2021 4:21:37 PM By: Linton Ham MD Entered  By: Linton Ham on 10/14/2021 11:16:01 [name] ([id number]) -------------------------------------------------------------------------------- SuperBill Details Patient Name: [name], [name]. Date of Service: 10/14/2021 Medical Record Number: [id number] Patient Account Number: [id number] Date of Birth/Sex: 12-13-1945 (75 y.o. M) Treating RN: [name] Primary Care Provider: Fulton Reek Other Clinician: Referring Provider: Fulton Reek Treating Provider/Extender: Tito Dine in Treatment: 11 Diagnosis Coding ICD-10 Codes Code Description E11.621 Type 2 diabetes mellitus with foot ulcer L97.421 Non-pressure chronic ulcer of left heel and midfoot limited to breakdown of skin E11.42 Type 2 diabetes mellitus with diabetic polyneuropathy Facility Procedures CPT4 Code: [id number] Description: [phone] - WOUND CARE VISIT-LEV 2 EST PT Modifier: Quantity: 1 Physician Procedures CPT4 Code: 5102585 Description: 99213 - WC PHYS LEVEL 3 - EST PT Modifier: Quantity: 1 CPT4 Code: Description: ICD-10 Diagnosis Description E11.621 Type 2 diabetes mellitus with foot ulcer L97.421 Non-pressure chronic ulcer of left heel and midfoot limited to breakdo E11.42 Type 2 diabetes mellitus with diabetic polyneuropathy Modifier: wn of skin Quantity: Electronic Signature(s) Signed: 10/14/2021 4:21:37 PM By: Linton Ham MD Signed: 10/14/2021 4:34:52 PM By: Levora Dredge Entered By: Levora Dredge on 10/14/2021 11:22:52

## 2021-10-21 ENCOUNTER — Encounter: Payer: Medicare HMO | Attending: Physician Assistant

## 2021-10-21 ENCOUNTER — Other Ambulatory Visit: Payer: Self-pay

## 2021-10-21 DIAGNOSIS — E11621 Type 2 diabetes mellitus with foot ulcer: Secondary | ICD-10-CM | POA: Diagnosis not present

## 2021-10-21 DIAGNOSIS — Z7901 Long term (current) use of anticoagulants: Secondary | ICD-10-CM | POA: Diagnosis not present

## 2021-10-21 DIAGNOSIS — G8929 Other chronic pain: Secondary | ICD-10-CM | POA: Diagnosis not present

## 2021-10-21 DIAGNOSIS — Z952 Presence of prosthetic heart valve: Secondary | ICD-10-CM | POA: Insufficient documentation

## 2021-10-21 DIAGNOSIS — L97421 Non-pressure chronic ulcer of left heel and midfoot limited to breakdown of skin: Secondary | ICD-10-CM | POA: Insufficient documentation

## 2021-10-21 DIAGNOSIS — I480 Paroxysmal atrial fibrillation: Secondary | ICD-10-CM | POA: Diagnosis not present

## 2021-10-21 DIAGNOSIS — I890 Lymphedema, not elsewhere classified: Secondary | ICD-10-CM | POA: Diagnosis not present

## 2021-10-21 DIAGNOSIS — E1142 Type 2 diabetes mellitus with diabetic polyneuropathy: Secondary | ICD-10-CM | POA: Diagnosis not present

## 2021-10-21 NOTE — Progress Notes (Signed)
CHARVIS, LIGHTNER ([id number]) Visit Report for 10/21/2021 Arrival Information Details Patient Name: [name], [name]. Date of Service: 10/21/2021 3:45 PM Medical Record Number: [id number] Patient Account Number: [id number] Date of Birth/Sex: 11-Jul-1946 (76 y.o. M) Treating RN: Levora Dredge Primary Care Lewie Deman: Fulton Reek Other Clinician: Referring Cillian Gwinner: Fulton Reek Treating Shirleymae Hauth/Extender: Skipper Cliche in Treatment: 12 Visit Information History Since Last Visit Added or deleted any medications: No Patient Arrived: [name] Any new allergies or adverse reactions: No Arrival Time: 15:49 Had a fall or experienced change in No Accompanied By: self activities of daily living that may affect Transfer Assistance: None risk of falls: Patient Identification Verified: Yes Hospitalized since last visit: No Secondary Verification Process Completed: Yes Has Dressing in Place as Prescribed: Yes Patient Has Alerts: Yes Has Footwear/Offloading in Place as Prescribed: Yes Patient Alerts: Patient on Blood Thinner Left: Surgical Shoe with Pressure Relief Coumadin Insole Diabetic Pain Present Now: No aspirin 81mg  Electronic Signature(s) Signed: 10/21/2021 4:14:32 PM By: Levora Dredge Entered By: Levora Dredge on 10/21/2021 15:51:33 [name] ([id number]) -------------------------------------------------------------------------------- Clinic Level of Care Assessment Details Patient Name: [name]. Date of Service: 10/21/2021 3:45 PM Medical Record Number: [id number] Patient Account Number: [id number] Date of Birth/Sex: November 17, 1945 (76 y.o. M) Treating RN: Levora Dredge Primary Care Maebel Marasco: Fulton Reek Other Clinician: Referring Ersie Savino: Fulton Reek Treating Jaylenn Baiza/Extender: Skipper Cliche in Treatment: 12 Clinic Level of Care Assessment Items TOOL 4 Quantity Score X - Use when only an EandM is performed on FOLLOW-UP visit 1  0 ASSESSMENTS - Nursing Assessment / Reassessment []  - Reassessment of Co-morbidities (includes updates in patient status) 0 []  - 0 Reassessment of Adherence to Treatment Plan ASSESSMENTS - Wound and Skin Assessment / Reassessment X - Simple Wound Assessment / Reassessment - one wound 1 5 []  - 0 Complex Wound Assessment / Reassessment - multiple wounds []  - 0 Dermatologic / Skin Assessment (not related to wound area) ASSESSMENTS - Focused Assessment []  - Circumferential Edema Measurements - multi extremities 0 []  - 0 Nutritional Assessment / Counseling / Intervention []  - 0 Lower Extremity Assessment (monofilament, tuning fork, pulses) []  - 0 Peripheral Arterial Disease Assessment (using hand held doppler) ASSESSMENTS - Ostomy and/or Continence Assessment and Care []  - Incontinence Assessment and Management 0 []  - 0 Ostomy Care Assessment and Management (repouching, etc.) PROCESS - Coordination of Care X - Simple Patient / Family Education for ongoing care 1 15 []  - 0 Complex (extensive) Patient / Family Education for ongoing care []  - 0 Staff obtains Programmer, systems, Records, Test Results / Process Orders []  - 0 Staff telephones HHA, Nursing Homes / Clarify orders / etc []  - 0 Routine Transfer to another Facility (non-emergent condition) []  - 0 Routine Hospital Admission (non-emergent condition) []  - 0 New Admissions / Biomedical engineer / Ordering NPWT, Apligraf, etc. []  - 0 Emergency Hospital Admission (emergent condition) X- 1 10 Simple Discharge Coordination []  - 0 Complex (extensive) Discharge Coordination PROCESS - Special Needs []  - Pediatric / Minor Patient Management 0 []  - 0 Isolation Patient Management []  - 0 Hearing / Language / Visual special needs []  - 0 Assessment of Community assistance (transportation, D/C planning, etc.) []  - 0 Additional assistance / Altered mentation []  - 0 Support Surface(s) Assessment (bed, cushion, seat,  etc.) INTERVENTIONS - Wound Cleansing / Measurement [name], [name]. ([id number]) X- 1 5 Simple Wound Cleansing - one wound []  - 0 Complex Wound Cleansing - multiple wounds []  - 0 Wound Imaging (photographs - any  number of wounds) []  - 0 Wound Tracing (instead of photographs) []  - 0 Simple Wound Measurement - one wound []  - 0 Complex Wound Measurement - multiple wounds INTERVENTIONS - Wound Dressings X - Small Wound Dressing one or multiple wounds 1 10 []  - 0 Medium Wound Dressing one or multiple wounds []  - 0 Large Wound Dressing one or multiple wounds []  - 0 Application of Medications - topical []  - 0 Application of Medications - injection INTERVENTIONS - Miscellaneous []  - External ear exam 0 []  - 0 Specimen Collection (cultures, biopsies, blood, body fluids, etc.) []  - 0 Specimen(s) / Culture(s) sent or taken to Lab for analysis []  - 0 Patient Transfer (multiple staff / Civil Service fast streamer / Similar devices) []  - 0 Simple Staple / Suture removal (25 or less) []  - 0 Complex Staple / Suture removal (26 or more) []  - 0 Hypo / Hyperglycemic Management (close monitor of Blood Glucose) []  - 0 Ankle / Brachial Index (ABI) - do not check if billed separately []  - 0 Vital Signs Has the patient been seen at the hospital within the last three years: Yes Total Score: 45 Level Of Care: New/Established - Level 2 Electronic Signature(s) Signed: 10/21/2021 4:14:32 PM By: Levora Dredge Entered By: Levora Dredge on 10/21/2021 16:12:16 [name] ([id number]) -------------------------------------------------------------------------------- Encounter Discharge Information Details Patient Name: [name], [name]. Date of Service: 10/21/2021 3:45 PM Medical Record Number: [id number] Patient Account Number: [id number] Date of Birth/Sex: 07/05/1946 (76 y.o. M) Treating RN: Levora Dredge Primary Care Gisela Lea: Fulton Reek Other Clinician: Referring Kenley Troop: Fulton Reek Treating Prapti Grussing/Extender: Skipper Cliche in Treatment: 12 Encounter Discharge Information Items Discharge Condition: Stable Ambulatory Status: Cane Discharge Destination: Home Transportation: Private Auto Accompanied By: self Schedule Follow-up Appointment: Yes Clinical Summary of Care: Electronic Signature(s) Signed: 10/21/2021 4:14:32 PM By: Levora Dredge Entered By: Levora Dredge on 10/21/2021 16:10:25 [name] ([id number]) -------------------------------------------------------------------------------- Wound Assessment Details Patient Name: [name], [name]. Date of Service: 10/21/2021 3:45 PM Medical Record Number: [id number] Patient Account Number: [id number] Date of Birth/Sex: 1946/04/18 (76 y.o. M) Treating RN: Levora Dredge Primary Care Shemika Robbs: Fulton Reek Other Clinician: Referring Ragina Fenter: Fulton Reek Treating Charleston Hankin/Extender: Skipper Cliche in Treatment: 12 Wound Status Wound Number: 9 Primary Diabetic Wound/Ulcer of the Lower Extremity Etiology: Wound Location: Left Calcaneus Wound Open Wounding Event: Gradually Appeared Status: Date Acquired: 07/07/2021 Comorbid Cataracts, Arrhythmia, Coronary Artery Disease, Weeks Of Treatment: 12 History: Hypertension, Type II Diabetes, Osteoarthritis, Clustered Wound: No Neuropathy Wound Measurements Length: (cm) 1.3 Width: (cm) 1.2 Depth: (cm) 0.1 Area: (cm) 1.225 Volume: (cm) 0.123 % Reduction in Area: 44.3% % Reduction in Volume: 44.1% Epithelialization: None Tunneling: No Undermining: No Wound Description Classification: Grade 2 Wound Margin: Thickened Exudate Amount: Large Exudate Type: Purulent Exudate Color: yellow, brown, green Foul Odor After Cleansing: No Slough/Fibrino Yes Wound Bed Granulation Amount: Small (1-33%) Exposed Structure Granulation Quality: Pink Fascia Exposed: No Necrotic Amount: Large (67-100%) Fat Layer (Subcutaneous Tissue) Exposed:  Yes Necrotic Quality: Adherent Slough Tendon Exposed: No Muscle Exposed: No Joint Exposed: No Bone Exposed: No Treatment Notes Wound #9 (Calcaneus) Wound Laterality: Left Cleanser Normal Saline Discharge Instruction: Wash your hands with soap and water. Remove old dressing, discard into plastic bag and place into trash. Cleanse the wound with Normal Saline prior to applying a clean dressing using gauze sponges, not tissues or cotton balls. Do not scrub or use excessive force. Pat dry using gauze sponges, not tissue or cotton balls. Peri-Wound Care Topical Primary Dressing Silvercel  Small 2x2 (in/in) Discharge Instruction: Apply Silvercel Small 2x2 (in/in) as instructed Secondary Dressing ABD Pad 5x9 (in/in) Discharge Instruction: Cover with ABD pad heel cup Discharge Instruction: apply over dressing to protect heel [name], [name] ([id number]) Secured With 58M Medipore H Soft Cloth Surgical Tape, 2x2 (in/yd) Coban Cohesive Bandage 4x5 (yds) Stretched Discharge Instruction: Apply Coban as directed. Kerlix Roll Sterile or Non-Sterile 6-ply 4.5x4 (yd/yd) Discharge Instruction: Apply Kerlix as directed Compression Wrap Compression Stockings Add-Ons Prevalon boot Discharge Instruction: use while in bed Electronic Signature(s) Signed: 10/21/2021 4:14:32 PM By: Levora Dredge Entered By: Levora Dredge on 10/21/2021 16:08:54

## 2021-10-21 NOTE — Progress Notes (Signed)
ANTONIOS, OSTROW ([id number]) Visit Report for 10/21/2021 Physician Orders Details Patient Name: [name], [name]. Date of Service: 10/21/2021 3:45 PM Medical Record Number: [id number] Patient Account Number: [id number] Date of Birth/Sex: [birth year] (76 y.o. M) Treating RN: Levora Dredge Primary Care Provider: Fulton Reek Other Clinician: Referring Provider: Fulton Reek Treating Provider/Extender: Skipper Cliche in Treatment: 12 Verbal / Phone Orders: No Diagnosis Coding Follow-up Appointments o Return Appointment in 1 week. o Nurse Visit as needed - call to schedule Bathing/ Shower/ Hygiene o May shower with wound dressing protected with water repellent cover or cast protector. o No tub bath. Edema Control - Lymphedema / Segmental Compressive Device / Other o Patient to wear own compression stockings. Remove compression stockings every night before going to bed and put on every morning when getting up. - right leg o Elevate leg(s) parallel to the floor when sitting. o DO YOUR BEST to sleep in the bed at night. DO NOT sleep in your recliner. Long hours of sitting in a recliner leads to swelling of the legs and/or potential wounds on your backside. Off-Loading o Other: - L heel offloader shoe Keep pressure off of heel in the bed-float heel in [employer] order from [employer], see sheet Additional Orders / Instructions o Follow Nutritious Diet and Increase Protein Intake - check blood sugar and eat protein for wound healing Wound Treatment Wound #9 - Calcaneus Wound Laterality: Left Cleanser: Normal Saline Every Other Day/15 Days Discharge Instructions: Wash your hands with soap and water. Remove old dressing, discard into plastic bag and place into trash. Cleanse the wound with Normal Saline prior to applying a clean dressing using gauze sponges, not tissues or cotton balls. Do not scrub or use excessive force. Pat dry using gauze sponges, not tissue  or cotton balls. Primary Dressing: Silvercel Small 2x2 (in/in) (Generic) Every Other Day/15 Days Discharge Instructions: Apply Silvercel Small 2x2 (in/in) as instructed Secondary Dressing: ABD Pad 5x9 (in/in) Every Other Day/15 Days Discharge Instructions: Cover with ABD pad Secondary Dressing: heel cup Every Other Day/15 Days Discharge Instructions: apply over dressing to protect heel Secured With: 32M Medipore H Soft Cloth Surgical Tape, 2x2 (in/yd) (Generic) Every Other Day/15 Days Secured With: Coban Cohesive Bandage 4x5 (yds) Stretched Every Other Day/15 Days Discharge Instructions: Apply Coban as directed. Secured With: [employer] or Non-Sterile 6-ply 4.5x4 (yd/yd) (Generic) Every Other Day/15 Days Discharge Instructions: Apply Kerlix as directed Add-Ons: Prevalon boot Every Other Day/15 Days Discharge Instructions: use while in bed Electronic Signature(s) ANTONIS, LOR ([id number]) Signed: 10/21/2021 4:14:32 PM By: Levora Dredge Signed: 10/21/2021 4:50:46 PM By: Worthy Keeler PA-C Entered By: Levora Dredge on 10/21/2021 16:09:52 ARNE, SCHLENDER ([id number]) -------------------------------------------------------------------------------- SuperBill Details Patient Name: [name], [name]. Date of Service: 10/21/2021 Medical Record Number: [id number] Patient Account Number: [id number] Date of Birth/Sex: 10-21-1945 (76 y.o. 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

## 2021-10-28 ENCOUNTER — Other Ambulatory Visit: Payer: Self-pay

## 2021-10-28 ENCOUNTER — Encounter: Payer: Medicare HMO | Admitting: Physician Assistant

## 2021-10-28 DIAGNOSIS — E11621 Type 2 diabetes mellitus with foot ulcer: Secondary | ICD-10-CM | POA: Diagnosis not present

## 2021-10-28 NOTE — Progress Notes (Addendum)
DENSON, NICCOLI ([id number]) Visit Report for 10/28/2021 Chief Complaint Document Details Patient Name: [name], [name]. Date of Service: 10/28/2021 11:15 AM Medical Record Number: [id number] Patient Account Number: [id number] Date of Birth/Sex: 1946/09/06 (76 y.o. M) Treating RN: Levora Dredge Primary Care Provider: Fulton Reek Other Clinician: Referring Provider: Fulton Reek Treating Provider/Extender: Skipper Cliche in Treatment: 13 Information Obtained from: Patient Chief Complaint Right LE ulcers and cellulitis Electronic Signature(s) Signed: 10/28/2021 11:23:24 AM By: Worthy Keeler PA-C Entered By: Worthy Keeler on 10/28/2021 11:23:24 HALSEY, HAMMEN ([id number]) -------------------------------------------------------------------------------- Debridement Details Patient Name: [name], [name]. Date of Service: 10/28/2021 11:15 AM Medical Record Number: [id number] Patient Account Number: [id number] Date of Birth/Sex: 08-Apr-1946 (76 y.o. M) Treating RN: Levora Dredge Primary Care Provider: Fulton Reek Other Clinician: Referring Provider: Fulton Reek Treating Provider/Extender: Skipper Cliche in Treatment: 13 Debridement Performed for Wound #9 Left Calcaneus Assessment: Performed By: Physician Tommie Sams., PA-C Debridement Type: Debridement Severity of Tissue Pre Debridement: Fat layer exposed Level of Consciousness (Pre- Awake and Alert procedure): Pre-procedure Verification/Time Out Yes - 12:06 Taken: Pain Control: Lidocaine 4% Topical Solution Total Area Debrided (L x W): 1.3 (cm) x 1 (cm) = 1.3 (cm) Tissue and other material Viable, Callus, Slough, Subcutaneous, Slough debrided: Level: Skin/Subcutaneous Tissue Debridement Description: Excisional Instrument: Curette Bleeding: Minimum Hemostasis Achieved: Pressure Response to Treatment: Procedure was tolerated well Level of Consciousness (Post- Awake and  Alert procedure): Post Debridement Measurements of Total Wound Length: (cm) 0.6 Width: (cm) 0.6 Depth: (cm) 0.1 Volume: (cm) 0.028 Character of Wound/Ulcer Post Debridement: Stable Severity of Tissue Post Debridement: Fat layer exposed Post Procedure Diagnosis Same as Pre-procedure Electronic Signature(s) Signed: 10/28/2021 4:15:58 PM By: Worthy Keeler PA-C Signed: 10/28/2021 4:38:18 PM By: Levora Dredge Entered By: Levora Dredge on 10/28/2021 12:10:41 [name] ([id number]) -------------------------------------------------------------------------------- HPI Details Patient Name: [name], [name]. Date of Service: 10/28/2021 11:15 AM Medical Record Number: [id number] Patient Account Number: [id number] Date of Birth/Sex: 1946-10-13 (76 y.o. M) Treating RN: Levora Dredge Primary Care Provider: Fulton Reek Other Clinician: Referring Provider: Fulton Reek Treating Provider/Extender: Skipper Cliche in Treatment: 13 History of Present Illness HPI Description: 09/24/2020 on evaluation today patient presents today for a heel ulcer that he tells me has been present for about 2 years. He has been seeing podiatry and they have been attempting to manage this including what sounds to be a total contact cast, Unna boot, and just standard dressings otherwise as well. Most recently has been using triple antibiotic ointment. With that being said unfortunately despite everything he really has not had any significant improvement. He tells me that he cannot even really remember exactly how this began but he presumed it may have rubbed on his shoes or something of that nature. 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 He tells me that this does seem to help. Fortunately there is no signs of active infection at this time. His most recent hemoglobin A1c was 8.1 though he knows this was this year he cannot tell me the exact time. His fluid pills currently to help with some of the lower extremity edema although he does obviously have signs of venous stasis/lymphedema. Currently there is no  evidence of active infection. No fevers, chills, nausea, vomiting, or diarrhea. Patient has had fairly recent ABIs which were performed on 07/19/2020 and revealed that he has normal findings in both the ankle and toe locations bilaterally. His ABI on the right was 1.09 on the left was 1.08 with a TBI on the right of 0.88 and on the left of 0.94. Triphasic flow was noted throughout. 10/08/2020 on evaluation today patient appears to be doing pretty well in regard to his left heel currently in fact this is doing a great job and seems to be healing quite nicely. Unfortunately on his right leg he had a pile of wood that actually fell on him injuring his right leg this is somewhat erythematous has me concerned little bit about cellulitis though there is not really a good area to culture at this point. 10/24/2020 upon evaluation today patient appears to be doing well with regard to his heel ulcer. He is showing signs of improvement which is great news. His right leg is completely healed. Overall I feel like he is doing excellent and there is no signs of infection. 11/07/2020 upon evaluation today patient appears to be doing well with regard to his heel ulcer. He tells me that last week when he was unable to come in his wife actually thought that the wound was very close to closing if not closed. Then it began to "reopen again". I really feel like what may have happened as the collagen may have dried over the wound bed and that because that misunderstanding with thinking that the wound was healing. With that being said I  did not see it last week I do not know that for certain. Either way I feel like he is doing great today I see no signs of infection at this point. 11/26/2020 upon inspection today patient appears to be doing decently well in regard to his heel ulcer. He has been tolerating the dressing changes without complication. Fortunately there is no sign of active infection at this time. No fevers, chills, nausea, vomiting, or diarrhea. 12/10/2020 upon evaluation today patient appears to be doing fairly well in regard to the wound on his heel as well as what appears to be a new wound of the left first metatarsal head plantar aspect. This seems to be an area that was callus that has split as the patient tells me has been trying to walk on his toes more has probably where this came from. With that being said there does not appear to be signs of active infection which is great news. 12/17/2020 upon evaluation today patient actually appears to be making good progress currently. Fortunately there is no evidence of active infection at this time. Overall I feel like he is very close to complete closure. 12/26/2020 upon evaluation today patient appears to be doing excellent in regard to his wounds. In fact I am not certain that these are not even completely healed on initial inspection. Overall I am very pleased with where things stand today. Good news is that they are healed he is actually get ready to go out of town and that will be helpful as well as he will be a full part of the time. Readmission: 02/04/2021 upon evaluation today patient appears to be doing well at this point in regard to his left heel that I previously saw him for. Unfortunately he is having issues with his right lower extremity. He has significant wounds at this point he also has erythema noted there  is definite signs of cellulitis which is unfortunate. With that being said I think we do need to address this sooner rather than later. The good news  is he did have a nice trip to the beach. He tells me that he had no issues during that time. 4/27; patient on Bactrim. Culture showed methicillin sensitive staph aureus therefore the Bactrim should be effective. 2 small areas on the right leg are healed the area on the mid aspect of the tibia almost 100% covered in a very adherent necrotic debris. We have been using silver alginate 02/20/2021 upon evaluation today patient appears to be doing well with regard to his wound. He is showing signs of improvement which is great news overall very pleased with where things stand today. No fevers, chills, nausea, vomiting, or diarrhea. 02/27/2021 upon evaluation today patient appears to be doing well with regard to his leg ulcer. He is tolerating the dressing changes and overall appears to be doing quite excellent. I am extremely pleased with where things stand and overall I think patient is making great progress. There is no sign of active infection at this time which is also great news. 03/06/2021 upon evaluation today patient appears to be doing excellent in regard to his wounds. In fact he appears to be completely healed today based on what I am seeing. This is excellent news and overall I am extremely pleased with where he stands. Overall the patient is happy to hear this as well this has been quite sometime coming. Readmission: 04/01/2021 patient unfortunately returns for readmission today. He tells me that he has been wearing his compression socks on the right leg daily WELFORD, CHRISTMAS ([id number]) and he does not really know what is going on and why his legs are doing what they are doing. We did therefore go ahead and probe deeper into exactly what has been going on with him today. Subsequently the patient tells me that when he gets up and what we would call "first thing in the morning" are really 2 different things". He does not tend to sleep well so he tells me that he will often wake up at 3:00 in the  morning. He will then potentially going into the living room to get in his chair where he may read a book for a little while and then potentially fall asleep back in his chair. He then subsequently wake up around 5:00 or so and then get up and in his words "putter around". This often will end with him proceeding at some point around 8 AM or 9 AM to putting on his compression socks. With that being said this means that anywhere from the 3:00 in the morning till roughly around 9:00 in the morning he has no compression on yet he is sitting in his recliner, walking around and up and about, and this is at least 5 to 6 hours of noncompressed time. That may be our issue here but I did not realize until we discussed this further today. Fortunately there does not appear to be any signs of active infection at this time which is great news. With that being said he has multiple wounds of the bilateral lower extremities. 04/10/2021 upon evaluation today patient appears to be doing well with regard to his legs for the most part. He does look like he had some injury where his wrap slid down nonetheless he did some "doctoring on them". I do feel like most of the areas honestly have cleared back up  which is great news. There does not appear to be any signs of active infection at this time which is also great news. No fevers, chills, nausea, vomiting, or diarrhea. 04/18/2021 upon evaluation today patient appears to be doing well with regard to his wounds in fact he appears to be completely healed which is great news. Fortunately there does not appear to be any signs of active infection which is great news. No fevers, chills, nausea, vomiting, or diarrhea. READMISSION 07/28/2021 This is a now 76 year old man we have had in this clinic for quite a bit of this year. 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 He wears compression stockings. 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 He comes in an old running shoes although he says he wears better shoes at home. His history is that he noticed a blister in the right posterior heel. This open. He has been applying Neosporin to it currently the wound measures 2 x 1.4 cm. 100% slough covered. Not really offloading this in any rigorous way. Past medical history is essentially unchanged he has paroxysmal atrial fibrillation on Coumadin type 2 diabetes with a recent hemoglobin A1c of 7 on metformin. ABI in our clinic was 0.98 on the right 08/05/2021 upon evaluation today patient appears to be doing well with regard to his heel ulcer. Fortunately there does not appear to be any signs of active infection at this time. Overall I been very pleased with where things seem to be currently as far as the wound healing is concerned. Again this is first a lot of seeing him Dr. Dellia Nims readmitted him last week. Nonetheless I think we are definitely making some progress here. 08/11/2021 upon evaluation today patient's wound on the heel actually showing signs of excellent improvement. I am actually very pleased with where things stand currently. No fevers, chills, nausea, vomiting, or diarrhea. There does not appear to be any need for sharp debridement today either which is also great news. 08/25/2021 upon evaluation today patient appears to be doing well with regard to his heel ulcer. This is actually looking significantly improved compared to last time I saw him. Fortunately there does not appear to be any evidence of active infection at this time. No fevers, chills, nausea, vomiting, or diarrhea. 09/01/2021 upon evaluation today patient appears to be doing decently well in regard to his wounds. Fortunately there does not appear to be any signs of active infection at this time which is great news and overall very pleased in that regard. I do not see any evidence of active  infection systemically which is great news as well. No fevers, chills, nausea, vomiting, or diarrhea. I think that the heel is making excellent progress. 09/15/2021 upon evaluation today patient's heel unfortunately is significantly worse compared to what it was previous. I do believe that at this time he would benefit from switching back to something a little bit more offloading from the cushion shoe he has right now this is more of a heel protector what I really need is a Prevalon offloading boot which I think is good to do a lot better for him than just the small heel protector. 09/23/2021 upon evaluation today patient appears to be doing a little better in regards to last week's visit. Overall I think that he is making good progress which is great news and there does not appear to be any evidence of active infection  at this time. No fevers, chills, nausea, vomiting, or diarrhea. 09/30/2021 upon evaluation patient's heel is actually showing signs of good improvement which is great news. Fortunately there does not appear to be any evidence of active infection locally nor systemically at this point. No fevers, chills, nausea, vomiting, or diarrhea. 10/07/2021 upon evaluation today patient appears to be doing well currently in regard to his heel ulcer. He has been tolerating the dressing changes without complication. Fortunately I do not see any signs of active infection at this time which is great news. No fevers, chills, nausea, vomiting, or diarrhea. 12/27; wound is measuring smaller. We have been using silver alginate 10/28/2021 upon evaluation today patient appears to be doing well currently in regard to his heel ulcer. I am actually very pleased with where things stand and I think he is making excellent progress. Fortunately I do not see any signs of active infection locally nor systemically at this point which is great news. Nonetheless I do believe that the patient would benefit from a new  offloading shoe and has been using it Velcro is no longer functioning properly and he tells me that he almost tripped and fell because of that today. Obviously I do not want him following that would be very bad. Electronic Signature(s) Signed: 10/28/2021 2:04:58 PM By: Worthy Keeler PA-C Entered By: Worthy Keeler on 10/28/2021 14:04:58 [name] ([id number]) -------------------------------------------------------------------------------- Physical Exam Details Patient Name: [name], [name] Date of Service: 10/28/2021 11:15 AM Medical Record Number: [id number] Patient Account Number: [id number] Date of Birth/Sex: 06-16-1946 (76 y.o. M) Treating RN: Levora Dredge Primary Care Provider: Fulton Reek Other Clinician: Referring Provider: Fulton Reek Treating Provider/Extender: Skipper Cliche in Treatment: 31 Constitutional Well-nourished and well-hydrated in no acute distress. Respiratory normal breathing without difficulty. Psychiatric this patient is able to make decisions and demonstrates good insight into disease process. Alert and Oriented x 3. pleasant and cooperative. Notes Upon inspection patient's wound bed actually showed signs of some slough from biofilm on the surface of the wound some callus around the edges of the wound I did perform debridement and cleared all this away postdebridement everything appears to be doing significantly better which is great news in general. Electronic Signature(s) Signed: 10/28/2021 2:05:14 PM By: Worthy Keeler PA-C Entered By: Worthy Keeler on 10/28/2021 14:05:14 [name] ([id number]) -------------------------------------------------------------------------------- Physician Orders Details Patient Name: [name], [name]. Date of Service: 10/28/2021 11:15 AM Medical Record Number: [id number] Patient Account Number: [id number] Date of Birth/Sex: 07-25-1946 (76 y.o. 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 o Nurse Visit as needed - call to schedule Bathing/ Shower/ Hygiene o May shower with wound dressing protected with water repellent cover or cast protector. o No tub bath. Edema Control - Lymphedema / Segmental Compressive Device / Other o Patient to wear own compression stockings. Remove compression stockings every night before going to bed and put on every morning when getting up. - right leg o Elevate leg(s) parallel to the floor when sitting. o DO YOUR BEST to sleep in the bed at night. DO NOT sleep in your recliner.  Long hours of sitting in a recliner leads to swelling of the legs and/or potential wounds on your backside. Off-Loading o Other: - L heel offloader shoe Keep pressure off of heel in the bed-float heel in [place] order from [employer], see sheet Additional Orders / Instructions o Follow Nutritious Diet and Increase Protein Intake - check blood sugar and eat protein for wound healing Wound Treatment Wound #9 - Calcaneus Wound Laterality: Left Cleanser: Normal Saline Every Other Day/15 Days Discharge Instructions: Wash your hands with soap and water. Remove old dressing, discard into plastic bag and place into trash. Cleanse the wound with Normal Saline prior to applying a clean dressing using gauze sponges, not tissues or cotton balls. Do not scrub or use excessive force. Pat dry using gauze sponges, not tissue or cotton balls. Primary Dressing: Silvercel Small 2x2 (in/in) (Generic)  Every Other Day/15 Days Discharge Instructions: Apply Silvercel Small 2x2 (in/in) as instructed Secondary Dressing: ABD Pad 5x9 (in/in) Every Other Day/15 Days Discharge Instructions: Cover with ABD pad Secondary Dressing: heel cup Every Other Day/15 Days Discharge Instructions: apply over dressing to protect heel Secured With: 44M Medipore H Soft Cloth Surgical Tape, 2x2 (in/yd) (Generic) Every Other Day/15 Days Secured With: Coban Cohesive Bandage 4x5 (yds) Stretched Every Other Day/15 Days Discharge Instructions: Apply Coban as directed. Secured With: [employer] or Non-Sterile 6-ply 4.5x4 (yd/yd) (Generic) Every Other Day/15 Days Discharge Instructions: Apply Kerlix as directed Add-Ons: Prevalon boot Every Other Day/15 Days Discharge Instructions: use while in bed IZEL, EISENHARDT ([id number]) Electronic Signature(s) Signed: 10/28/2021 4:15:58 PM By: Worthy Keeler PA-C Signed: 10/28/2021 4:38:18 PM By: Levora Dredge Entered By: Levora Dredge on 10/28/2021 12:21:23 MIRIAM, KESTLER ([id number]) -------------------------------------------------------------------------------- Problem List Details Patient Name: [name], [name]. Date of Service: 10/28/2021 11:15 AM Medical Record Number: [id number] Patient Account Number: [id number] Date of Birth/Sex: October 24, 1945 (76 y.o. 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 Date of Service: 10/28/2021 11:15 AM Medical Record Number: [id number] Patient Account Number: [id number] Date of Birth/Sex: 07-17-1946 (76 y.o. M) Treating RN: Levora Dredge Primary Care Provider: Fulton Reek Other Clinician: Referring Provider: Fulton Reek Treating Provider/Extender: Skipper Cliche in Treatment: 13 Subjective Chief Complaint Information obtained from Patient Right LE ulcers and cellulitis History of Present Illness (HPI) 09/24/2020 on evaluation today patient presents today for a heel ulcer that he tells me has been present for about 2 years. He has been seeing podiatry and they have been attempting to manage this including what sounds to be a total contact cast, Unna boot, and just standard dressings otherwise as well. Most recently has been using triple antibiotic ointment. With that being said unfortunately despite everything he really has not had any significant improvement. He tells me that he cannot even really remember exactly how this began but he presumed it may have rubbed on his shoes or something of that nature. 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 He tells me that this does seem to help. Fortunately there is no signs of active infection at this time. His most recent hemoglobin A1c was 8.1 though he knows this was this year he cannot tell me the exact time. His fluid pills currently to help with some of the lower extremity  edema although he does obviously have signs of venous stasis/lymphedema. Currently there is no evidence of active infection. No fevers, chills, nausea, vomiting, or diarrhea. Patient has had fairly recent ABIs which were performed on 07/19/2020 and revealed that he has normal findings in both the ankle and toe locations bilaterally. His ABI on the right was 1.09 on the left was 1.08 with a TBI on the right of 0.88 and on the left of 0.94. Triphasic flow was noted throughout. 10/08/2020 on evaluation today patient appears to be doing pretty well in regard to his left heel currently in fact this is doing a great job and seems to be healing quite nicely. Unfortunately on his right leg he had a pile of wood that actually fell on him injuring his right leg this is somewhat erythematous has me concerned little bit about cellulitis though there is not really a good area to culture at this point. 10/24/2020 upon evaluation today patient appears to be doing well with regard to his heel ulcer. He is showing signs of improvement which is great news. His right leg is completely healed. Overall I feel like he is doing excellent and there is no signs of infection. 11/07/2020 upon evaluation today patient appears to be doing well with regard to his heel ulcer. He tells me that last week when he was unable to come in his wife actually thought that the wound was very close to closing if not closed. Then it began to "reopen again". I really feel like what may have happened as the collagen may have dried over the wound bed and that because that misunderstanding with thinking that the wound was healing. With that being said I did not see it last week I do not know that for certain. Either way I feel like he is doing great today I see no signs of infection at this point. 11/26/2020 upon inspection today patient appears to be doing decently well in regard to his heel ulcer. He has been tolerating the dressing changes without  complication. Fortunately there is no sign of active infection at this time. No fevers, chills, nausea, vomiting, or diarrhea. 12/10/2020 upon evaluation today patient appears to be doing fairly well in regard to the wound on his heel as well as what appears to be a new wound of the left first metatarsal head plantar aspect. This seems to be an area that was callus that has split as the patient tells me has been trying to walk on his toes more has probably where this came from. With that being said there does not appear to be signs of active infection which is great news. 12/17/2020 upon evaluation today patient actually appears to be making good progress currently. Fortunately there is no evidence of active infection at this time. Overall I feel like he is very close to complete closure. 12/26/2020 upon evaluation  today patient appears to be doing excellent in regard to his wounds. In fact I am not certain that these are not even completely healed on initial inspection. Overall I am very pleased with where things stand today. Good news is that they are healed he is actually get ready to go out of town and that will be helpful as well as he will be a full part of the time. Readmission: 02/04/2021 upon evaluation today patient appears to be doing well at this point in regard to his left heel that I previously saw him for. Unfortunately he is having issues with his right lower extremity. He has significant wounds at this point he also has erythema noted there is definite signs of cellulitis which is unfortunate. With that being said I think we do need to address this sooner rather than later. The good news is he did have a nice trip to the beach. He tells me that he had no issues during that time. 4/27; patient on Bactrim. Culture showed methicillin sensitive staph aureus therefore the Bactrim should be effective. 2 small areas on the right leg are healed the area on the mid aspect of the tibia almost 100%  covered in a very adherent necrotic debris. We have been using silver alginate 02/20/2021 upon evaluation today patient appears to be doing well with regard to his wound. He is showing signs of improvement which is great news overall very pleased with where things stand today. No fevers, chills, nausea, vomiting, or diarrhea. 02/27/2021 upon evaluation today patient appears to be doing well with regard to his leg ulcer. He is tolerating the dressing changes and overall appears to be doing quite excellent. I am extremely pleased with where things stand and overall I think patient is making great progress. There is no sign of active infection at this time which is also great news. 03/06/2021 upon evaluation today patient appears to be doing excellent in regard to his wounds. In fact he appears to be completely healed today based on what I am seeing. This is excellent news and overall I am extremely pleased with where he stands. Overall the patient is happy to hear [name], [name] ([id number]) this as well this has been quite sometime coming. Readmission: 04/01/2021 patient unfortunately returns for readmission today. He tells me that he has been wearing his compression socks on the right leg daily and he does not really know what is going on and why his legs are doing what they are doing. We did therefore go ahead and probe deeper into exactly what has been going on with him today. Subsequently the patient tells me that when he gets up and what we would call "first thing in the morning" are really 2 different things". He does not tend to sleep well so he tells me that he will often wake up at 3:00 in the morning. He will then potentially going into the living room to get in his chair where he may read a book for a little while and then potentially fall asleep back in his chair. He then subsequently wake up around 5:00 or so and then get up and in his words "putter around". This often will end with him  proceeding at some point around 8 AM or 9 AM to putting on his compression socks. With that being said this means that anywhere from the 3:00 in the morning till roughly around 9:00 in the morning he has no compression on yet he is sitting in  his recliner, walking around and up and about, and this is at least 5 to 6 hours of noncompressed time. That may be our issue here but I did not realize until we discussed this further today. Fortunately there does not appear to be any signs of active infection at this time which is great news. With that being said he has multiple wounds of the bilateral lower extremities. 04/10/2021 upon evaluation today patient appears to be doing well with regard to his legs for the most part. He does look like he had some injury where his wrap slid down nonetheless he did some "doctoring on them". I do feel like most of the areas honestly have cleared back up which is great news. There does not appear to be any signs of active infection at this time which is also great news. No fevers, chills, nausea, vomiting, or diarrhea. 04/18/2021 upon evaluation today patient appears to be doing well with regard to his wounds in fact he appears to be completely healed which is great news. Fortunately there does not appear to be any signs of active infection which is great news. No fevers, chills, nausea, vomiting, or diarrhea. READMISSION 07/28/2021 This is a now 76 year old man we have had in this clinic for quite a bit of this year. 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 He wears compression stockings. 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 He comes in an old running shoes although he says he wears better shoes at home. His history is that he noticed a blister in the right posterior heel. This open. He has been applying Neosporin to it currently the wound measures 2 x  1.4 cm. 100% slough covered. Not really offloading this in any rigorous way. Past medical history is essentially unchanged he has paroxysmal atrial fibrillation on Coumadin type 2 diabetes with a recent hemoglobin A1c of 7 on metformin. ABI in our clinic was 0.98 on the right 08/05/2021 upon evaluation today patient appears to be doing well with regard to his heel ulcer. Fortunately there does not appear to be any signs of active infection at this time. Overall I been very pleased with where things seem to be currently as far as the wound healing is concerned. Again this is first a lot of seeing him Dr. Dellia Nims readmitted him last week. Nonetheless I think we are definitely making some progress here. 08/11/2021 upon evaluation today patient's wound on the heel actually showing signs of excellent improvement. I am actually very pleased with where things stand currently. No fevers, chills, nausea, vomiting, or diarrhea. There does not appear to be any need for sharp debridement today either which is also great news. 08/25/2021 upon evaluation today patient appears to be doing well with regard to his heel ulcer. This is actually looking significantly improved compared to last time I saw him. Fortunately there does not appear to be any evidence of active infection at this time. No fevers, chills, nausea, vomiting, or diarrhea. 09/01/2021 upon evaluation today patient appears to be doing decently well in regard to his wounds. Fortunately there does not appear to be any signs of active infection at this time which is great news and overall very pleased in that regard. I do not see any evidence of active infection systemically which is great news as well. No fevers, chills, nausea, vomiting, or diarrhea. I think that the heel is making excellent progress. 09/15/2021  upon evaluation today patient's heel unfortunately is significantly worse compared to what it was previous. I do believe that at this time he  would benefit from switching back to something a little bit more offloading from the cushion shoe he has right now this is more of a heel protector what I really need is a Prevalon offloading boot which I think is good to do a lot better for him than just the small heel protector. 09/23/2021 upon evaluation today patient appears to be doing a little better in regards to last week's visit. Overall I think that he is making good progress which is great news and there does not appear to be any evidence of active infection at this time. No fevers, chills, nausea, vomiting, or diarrhea. 09/30/2021 upon evaluation patient's heel is actually showing signs of good improvement which is great news. Fortunately there does not appear to be any evidence of active infection locally nor systemically at this point. No fevers, chills, nausea, vomiting, or diarrhea. 10/07/2021 upon evaluation today patient appears to be doing well currently in regard to his heel ulcer. He has been tolerating the dressing changes without complication. Fortunately I do not see any signs of active infection at this time which is great news. No fevers, chills, nausea, vomiting, or diarrhea. 12/27; wound is measuring smaller. We have been using silver alginate 10/28/2021 upon evaluation today patient appears to be doing well currently in regard to his heel ulcer. I am actually very pleased with where things stand and I think he is making excellent progress. Fortunately I do not see any signs of active infection locally nor systemically at this point which is great news. Nonetheless I do believe that the patient would benefit from a new offloading shoe and has been using it Velcro is no longer functioning properly and he tells me that he almost tripped and fell because of that today. Obviously I do not want him following that would be very bad. [name], [name] ([id number]) Objective Constitutional Well-nourished and well-hydrated in no  acute distress. Vitals Time Taken: 11:45 AM, Height: 69 in, Weight: 267 lbs, BMI: 39.4, Temperature: 98.3 F, Pulse: 61 bpm, Respiratory Rate: 18 breaths/min, Blood Pressure: 89/48 mmHg. General Notes: pt states feeling a little dizzy Respiratory normal breathing without difficulty. Psychiatric this patient is able to make decisions and demonstrates good insight into disease process. Alert and Oriented x 3. pleasant and cooperative. General Notes: Upon inspection patient's wound bed actually showed signs of some slough from biofilm on the surface of the wound some callus around the edges of the wound I did perform debridement and cleared all this away postdebridement everything appears to be doing significantly better which is great news in general. Integumentary (Hair, Skin) Wound #9 status is Open. Original cause of wound was Gradually Appeared. The date acquired was: 07/07/2021. The wound has been in treatment 13 weeks. The wound is located on the Left Calcaneus. The wound measures 1.3cm length x 1cm width x 0.1cm depth; 1.021cm^2 area and 0.102cm^3 volume. There is Fat Layer (Subcutaneous Tissue) exposed. There is no tunneling or undermining noted. There is a large amount of purulent drainage noted. The wound margin is thickened. There is small (1-33%) pink granulation within the wound bed. There is a large (67- 100%) amount of necrotic tissue within the wound bed including Adherent Slough. 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 There was a Excisional Skin/Subcutaneous Tissue Debridement with a total area of 1.3 sq cm performed  by Tommie Sams., PA-C. With the following instrument(s): Curette to remove Viable tissue/material. Material removed includes Callus, Subcutaneous Tissue, and Slough after achieving pain control using Lidocaine 4% Topical Solution. No specimens were taken. A time out was conducted at 12:06, prior to the start of the procedure. A Minimum amount of bleeding was controlled with Pressure. The procedure was tolerated well. Post Debridement Measurements: 0.6cm length x 0.6cm width x 0.1cm depth; 0.028cm^3 volume. Character of Wound/Ulcer Post Debridement is stable. Severity of Tissue Post Debridement is: Fat layer exposed. Post procedure Diagnosis Wound #9: Same as Pre-Procedure Plan Follow-up Appointments: Return Appointment in 1 week. Nurse Visit as needed - call to schedule Bathing/ Shower/ Hygiene: [name], [name] ([id number]) May shower with wound dressing protected with water repellent cover or cast protector. No tub bath. Edema Control - Lymphedema / Segmental Compressive Device / Other: Patient to wear own compression stockings. Remove compression stockings every night before going to bed and put on every morning when getting up. - right leg Elevate leg(s) parallel to the floor when sitting. DO YOUR BEST to sleep in the bed at night. DO NOT sleep in your recliner. Long hours of sitting in a recliner leads to swelling of the legs and/or potential wounds on your backside. Off-Loading: Other: - L heel offloader shoe Keep pressure off of heel in the bed-float heel in [place] order from [employer], see sheet Additional Orders / Instructions: Follow Nutritious Diet and Increase Protein Intake - check blood sugar and eat protein for wound healing WOUND #9: - Calcaneus Wound Laterality: Left Cleanser: Normal Saline Every Other Day/15 Days Discharge Instructions: Wash your hands with soap and water. Remove old dressing, discard into plastic bag and place into trash. Cleanse the wound  with Normal Saline prior to applying a clean dressing using gauze sponges, not tissues or cotton balls. Do not scrub or use excessive force. Pat dry using gauze sponges, not tissue or cotton balls. Primary Dressing: Silvercel Small 2x2 (in/in) (Generic) Every Other Day/15 Days Discharge Instructions: Apply Silvercel Small 2x2 (in/in) as instructed Secondary Dressing: ABD Pad 5x9 (in/in) Every Other Day/15 Days Discharge Instructions: Cover with ABD pad Secondary Dressing: heel cup Every Other Day/15 Days Discharge Instructions: apply over dressing to protect heel Secured With: 56M Medipore H Soft Cloth Surgical Tape, 2x2 (in/yd) (Generic) Every Other Day/15 Days Secured With: Coban Cohesive Bandage 4x5 (yds) Stretched Every Other Day/15 Days Discharge Instructions: Apply Coban as directed. Secured With: [employer] or Non-Sterile 6-ply 4.5x4 (yd/yd) (Generic) Every Other Day/15 Days Discharge Instructions: Apply Kerlix as directed Add-Ons: Prevalon boot Every Other Day/15 Days Discharge Instructions: use while in bed 1. I [name] suggest that we going to continue with the wound care measures as before and the patient is in agreement with the plan. This includes the use of the silver collagen dressing which I think is actually doing a good job point. 2. I am also can recommend at this time that we have the patient continue with the ABD pad to cover followed by a heel cup which does seem to be doing well for him. 3. He is using Prevalon boot in bed which I think is good to  try to keep pressure off the heel in an offloading shoe for a heel off loader to try to keep pressure off when he is up and about. We will see patient back for reevaluation in 1 week here in the clinic. If anything worsens or changes patient will contact our office for additional recommendations. Electronic Signature(s) Signed: 10/28/2021 2:05:59 PM By: Worthy Keeler PA-C Entered By: Worthy Keeler on 10/28/2021  14:05:59 [name] ([id number]) -------------------------------------------------------------------------------- SuperBill Details Patient Name: GRAYLAND, [name]. Date of Service: 10/28/2021 Medical Record Number: [id number] Patient Account Number: [id number] Date of Birth/Sex: 1945-10-28 (76 y.o. M) Treating RN: Levora Dredge Primary Care Provider: Fulton Reek Other Clinician: Referring Provider: Fulton Reek Treating Provider/Extender: Skipper Cliche in Treatment: 13 Diagnosis Coding ICD-10 Codes Code Description E11.621 Type 2 diabetes mellitus with foot ulcer L97.421 Non-pressure chronic ulcer of left heel and midfoot limited to breakdown of skin E11.42 Type 2 diabetes mellitus with diabetic polyneuropathy Facility Procedures CPT4 Code: [id number] Description: 21194 - DEB SUBQ TISSUE 20 SQ CM/< Modifier: Quantity: 1 CPT4 Code: Description: ICD-10 Diagnosis Description L97.421 Non-pressure chronic ulcer of left heel and midfoot limited to breakdow Modifier: n of skin Quantity: Physician Procedures CPT4 Code: [id number] Description: 11042 - WC PHYS SUBQ TISS 20 SQ CM Modifier: Quantity: 1 CPT4 Code: Description: ICD-10 Diagnosis Description L97.421 Non-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

## 2021-10-28 NOTE — Progress Notes (Addendum)
KAZ, AULD ([id number]) Visit Report for 10/28/2021 Arrival Information Details Patient Name: [name], [name]. Date of Service: 10/28/2021 11:15 AM Medical Record Number: [id number] Patient Account Number: [id number] Date of Birth/Sex: 07/04/1946 (76 y.o. M) Treating RN: Levora Dredge Primary Care Cherylyn Sundby: Fulton Reek Other Clinician: Referring Bill Mcvey: Fulton Reek Treating Kalan Yeley/Extender: Skipper Cliche in Treatment: 90 Visit Information History Since Last Visit Added or deleted any medications: No Patient Arrived: [name] Any new allergies or adverse reactions: No Arrival Time: 11:43 Had a fall or experienced change in No Accompanied By: self activities of daily living that may affect Transfer Assistance: None risk of falls: Patient Identification Verified: Yes Hospitalized since last visit: No Secondary Verification Process Completed: Yes Has Dressing in Place as Prescribed: Yes Patient Has Alerts: Yes Has Footwear/Offloading in Place as Prescribed: Yes Patient Alerts: Patient on Blood Thinner Left: Surgical Shoe with Pressure Relief Coumadin Insole Diabetic Pain Present Now: No aspirin 64m Electronic Signature(s) Signed: 10/28/2021 4:38:18 PM By: GLevora DredgeEntered By: GLevora Dredgeon 10/28/2021 11:45:36 [name]([id number] -------------------------------------------------------------------------------- Clinic Level of Care Assessment Details Patient Name: [name] Date of Service: 10/28/2021 11:15 AM Medical Record Number: [id number] Account Number: [id number] of Birth/Sex: 01/08/1946-04-28(76y.o. M) Treating RN: GLevora DredgePrimary Care Rhesa Forsberg: SFulton ReekOther Clinician: Referring Danyale Ridinger: SFulton ReekTreating Taiwana Willison/Extender: SSkipper Clichein Treatment: 13 Clinic Level of Care Assessment Items TOOL 1 Quantity Score _0  - Use when EandM and Procedure is performed on INITIAL visit  0 ASSESSMENTS - Nursing Assessment / Reassessment _1  - General Physical Exam (combine w/ comprehensive assessment (listed just below) when performed on new 0 pt. evals) _2  - 0 Comprehensive Assessment (HX, ROS, Risk Assessments, Wounds Hx, etc.) ASSESSMENTS - Wound and Skin Assessment / Reassessment _3  - Dermatologic / Skin Assessment (not related to wound area) 0 ASSESSMENTS - Ostomy and/or Continence Assessment and Care _4  - Incontinence Assessment and Management 0 _5  - 0 Ostomy Care Assessment and Management (repouching, etc.) PROCESS - Coordination of Care _6  - Simple Patient / Family Education for ongoing care 0 _7  - 0 Complex (extensive) Patient / Family Education for ongoing care _8  - 0 Staff obtains CProgrammer, systems Records, Test Results / Process Orders _9  - 0 Staff telephones HHA, Nursing Homes / Clarify orders / etc _10  - 0 Routine Transfer to another Facility (non-emergent condition) _11  - 0 Routine Hospital Admission (non-emergent condition) _12  - 0 New Admissions / IBiomedical engineer/ Ordering NPWT, Apligraf, etc. _13  - 0 Emergency Hospital Admission (emergent condition) PROCESS - Special Needs _14  - Pediatric / Minor Patient Management 0 _15  - 0 Isolation Patient Management _16  - 0 Hearing / Language / Visual special needs _17  - 0 Assessment of Community assistance (transportation, D/C planning, etc.) _18  - 0 Additional assistance / Altered mentation _19  - 0 Support Surface(s) Assessment (bed, cushion, seat, etc.) INTERVENTIONS - Miscellaneous _20  - External ear exam 0 _21  - 0 Patient Transfer (multiple staff / HCivil Service fast streamer/ Similar devices) _22  - 0 Simple Staple / Suture removal (25 or less) _23  - 0 Complex Staple / Suture removal (26 or more) _24  - 0 Hypo/Hyperglycemic Management (do not check if billed separately) _25  - 0 Ankle / Brachial Index (ABI) - do not check if billed separately Has the patient been seen at the hospital within the last three years:  Yes Total Score: 0 Level Of Care: ____ [name]([id number] Electronic Signature(s) Signed: 10/28/2021 4:38:18 PM By: GLevora DredgeEntered By:  Levora Dredge on 10/28/2021 12:21:55 [name], [name] ([id number]) -------------------------------------------------------------------------------- Encounter Discharge Information Details Patient Name: [name], [name]. Date of Service: 10/28/2021 11:15 AM Medical Record Number: [id number] Patient Account Number: [id number] Date of Birth/Sex: [birth year] (76 y.o. M) Treating RN: Levora Dredge Primary Care Taytum Wheller: Fulton Reek Other Clinician: Referring Tupac Jeffus: Fulton Reek Treating Tiah Heckel/Extender: Skipper Cliche in Treatment: 13 Encounter Discharge Information Items Post Procedure Vitals Discharge Condition: Stable Temperature (F): 98.3 Ambulatory Status: Cane Pulse (bpm): 61 Discharge Destination: Home Respiratory Rate (breaths/min): 18 Transportation: Private Auto Blood Pressure (mmHg): 124/69 Accompanied By: self Schedule Follow-up Appointment: Yes Clinical Summary of Care: Notes patient given water and BP re checked at end of appointment pt feeling better Electronic Signature(s) Signed: 10/28/2021 4:38:18 PM By: Levora Dredge Entered By: Levora Dredge on 10/28/2021 12:23:54 [name] ([id number]) -------------------------------------------------------------------------------- Lower Extremity Assessment Details Patient Name: [name], [name]. Date of Service: 10/28/2021 11:15 AM Medical Record Number: [id number] Patient Account Number: [id number] Date of Birth/Sex: Dec 14, 1945 (76 y.o. M) Treating RN: Levora Dredge Primary Care Natika Geyer: Fulton Reek Other Clinician: Referring Delayna Sparlin: Fulton Reek Treating Nasiya Pascual/Extender: Skipper Cliche in Treatment: 13 Edema Assessment Assessed: [Left: No] [Right: No] Edema: [Left: Ye] [Right: s] Calf Left: Right: Point of  Measurement: 32 cm From Medial Instep 39.5 cm Ankle Left: Right: Point of Measurement: 10 cm From Medial Instep 23.2 cm Vascular Assessment Pulses: Dorsalis Pedis Palpable: [Left:Yes] Posterior Tibial Palpable: [Left:Yes] Electronic Signature(s) Signed: 10/28/2021 4:38:18 PM By: Levora Dredge Entered By: Levora Dredge on 10/28/2021 11:56:27 [name] ([id number]) -------------------------------------------------------------------------------- Multi Wound Chart Details Patient Name: [name]. Date of Service: 10/28/2021 11:15 AM Medical Record Number: [id number] Patient Account Number: [id number] Date of Birth/Sex: [birth year] (76 y.o. M) Treating RN: Levora Dredge Primary Care Jeanmarc Viernes: Fulton Reek Other Clinician: Referring Promise Weldin: Fulton Reek Treating Makayela Secrest/Extender: Skipper Cliche in Treatment: 13 Vital Signs Height(in): 76 Pulse(bpm): 53 Weight(lbs): 88 Blood Pressure(mmHg): 89/48 Body Mass Index(BMI): 39 Temperature(F): 98.3 Respiratory Rate(breaths/min): 18 Photos: [N/A:N/A] Wound Location: Left Calcaneus N/A N/A Wounding Event: Gradually Appeared N/A N/A Primary Etiology: Diabetic Wound/Ulcer of the Lower N/A N/A Extremity Comorbid History: Cataracts, Arrhythmia, Coronary N/A N/A Artery Disease, Hypertension, Type II Diabetes, Osteoarthritis, Neuropathy Date Acquired: 07/07/2021 N/A N/A Weeks of Treatment: 13 N/A N/A Wound Status: Open N/A N/A Measurements L x W x D (cm) 1.3x1x0.1 N/A N/A Area (cm) : 1.021 N/A N/A Volume (cm) : 0.102 N/A N/A % Reduction in Area: 53.60% N/A N/A % Reduction in Volume: 53.60% N/A N/A Classification: Grade 2 N/A N/A Exudate Amount: Large N/A N/A Exudate Type: Purulent N/A N/A Exudate Color: yellow, brown, green N/A N/A Wound Margin: Thickened N/A N/A Granulation Amount: Small (1-33%) N/A N/A Granulation Quality: Pink N/A N/A Necrotic Amount: Large (67-100%) N/A N/A Exposed  Structures: Fat Layer (Subcutaneous Tissue): N/A N/A Yes Fascia: No Tendon: No Muscle: No Joint: No Bone: No Epithelialization: None N/A N/A Treatment Notes Electronic Signature(s) Signed: 10/28/2021 4:38:18 PM By: Levora Dredge Entered By: Levora Dredge on 10/28/2021 12:04:40 [name] ([id number]) -------------------------------------------------------------------------------- [place] Details Patient Name: [name], [name]. Date of Service: 10/28/2021 11:15 AM Medical Record Number: [id number] Patient Account Number: [id number] Date of Birth/Sex: 01-25-1946 (76 y.o. M) Treating RN: Levora Dredge Primary Care Annette Liotta: Fulton Reek Other Clinician: Referring Angla Delahunt: Fulton Reek Treating Ever Gustafson/Extender: Skipper Cliche in Treatment: 13 Active Inactive Wound/Skin Impairment Nursing Diagnoses: Impaired tissue integrity Knowledge deficit related to smoking impact on wound healing Knowledge deficit related to ulceration/compromised skin integrity  Goals: Patient/caregiver will verbalize understanding of skin care regimen Date Initiated: 07/28/2021 Date Inactivated: 08/11/2021 Target Resolution Date: 08/08/2021 Goal Status: Met Ulcer/skin breakdown will have a volume reduction of 30% by week 4 Date Initiated: 07/28/2021 Target Resolution Date: 08/28/2021 Goal Status: Active Ulcer/skin breakdown will have a volume reduction of 50% by week 8 Date Initiated: 07/28/2021 Target Resolution Date: 09/27/2021 Goal Status: Active Ulcer/skin breakdown will have a volume reduction of 80% by week 12 Date Initiated: 07/28/2021 Target Resolution Date: 10/28/2020 Goal Status: Active Ulcer/skin breakdown will heal within 14 weeks Date Initiated: 07/28/2021 Target Resolution Date: 11/12/2020 Goal Status: Active Interventions: Assess patient/caregiver ability to obtain necessary supplies Assess patient/caregiver ability to perform ulcer/skin  care regimen upon admission and as needed Assess ulceration(s) every visit Notes: Electronic Signature(s) Signed: 10/28/2021 4:38:18 PM By: Levora Dredge Entered By: Levora Dredge on 10/28/2021 11:57:51 [name] ([id number]) -------------------------------------------------------------------------------- Pain Assessment Details Patient Name: [name], [name]. Date of Service: 10/28/2021 11:15 AM Medical Record Number: [id number] Patient Account Number: [id number] Date of Birth/Sex: [name] 26, 1946 (76 y.o. M) Treating RN: Levora Dredge Primary Care Anastashia Westerfeld: Fulton Reek Other Clinician: Referring Suzzane Quilter: Fulton Reek Treating Kohan Azizi/Extender: Skipper Cliche in Treatment: 13 Active Problems Location of Pain Severity and Description of Pain Patient Has Paino No Site Locations Rate the pain. Current Pain Level: 0 Pain Management and Medication Current Pain Management: Electronic Signature(s) Signed: 10/28/2021 4:38:18 PM By: Levora Dredge Entered By: Levora Dredge on 10/28/2021 11:53:56 [name] ([id number]) -------------------------------------------------------------------------------- Patient/Caregiver Education Details Patient Name: [name], [name]. Date of Service: 10/28/2021 11:15 AM Medical Record Number: [id number] Patient Account Number: [id number] Date of Birth/Gender: 1945-12-27 (76 y.o. M) Treating RN: Levora Dredge Primary Care Physician: Fulton Reek Other Clinician: Referring Physician: Fulton Reek Treating Physician/Extender: Skipper Cliche in Treatment: 13 Education Assessment Education Provided To: Patient Education Topics Provided Wound/Skin Impairment: Handouts: Caring for Your Ulcer Methods: Explain/Verbal Responses: State content correctly Electronic Signature(s) Signed: 10/28/2021 4:38:18 PM By: Levora Dredge Entered By: Levora Dredge on 10/28/2021 12:22:20 [name]  ([id number]) -------------------------------------------------------------------------------- Wound Assessment Details Patient Name: [name], [name]. Date of Service: 10/28/2021 11:15 AM Medical Record Number: [id number] Patient Account Number: [id number] Date of Birth/Sex: 1946/01/01 (76 y.o. M) Treating RN: Levora Dredge Primary Care Shantale Holtmeyer: Fulton Reek Other Clinician: Referring [name] [name]: Fulton Reek Treating Kella Splinter/Extender: Skipper Cliche in Treatment: 13 Wound Status Wound Number: 9 Primary Diabetic Wound/Ulcer of the Lower Extremity Etiology: Wound Location: Left Calcaneus Wound Open Wounding Event: Gradually Appeared Status: Date Acquired: 07/07/2021 Comorbid Cataracts, Arrhythmia, Coronary Artery Disease, Weeks Of Treatment: 13 History: Hypertension, Type II Diabetes, Osteoarthritis, Clustered Wound: No Neuropathy Photos Wound Measurements Length: (cm) 1.3 Width: (cm) 1 Depth: (cm) 0.1 Area: (cm) 1.021 Volume: (cm) 0.102 % Reduction in Area: 53.6% % Reduction in Volume: 53.6% Epithelialization: None Tunneling: No Undermining: No Wound Description Classification: Grade 2 Wound Margin: Thickened Exudate Amount: Large Exudate Type: Purulent Exudate Color: yellow, brown, green Foul Odor After Cleansing: No Slough/Fibrino Yes Wound Bed Granulation Amount: Small (1-33%) Exposed Structure Granulation Quality: Pink Fascia Exposed: No Necrotic Amount: Large (67-100%) Fat Layer (Subcutaneous Tissue) Exposed: Yes Necrotic Quality: Adherent Slough Tendon Exposed: No Muscle Exposed: No Joint Exposed: No Bone Exposed: No Treatment Notes Wound #9 (Calcaneus) Wound Laterality: Left Cleanser Normal Saline Discharge Instruction: Wash your hands with soap and water. Remove old dressing, discard into plastic bag and place into trash. Cleanse the wound with Normal Saline prior to applying a clean dressing using gauze sponges, not tissues or  cotton  balls. Do not [name], [name] ([id number]) scrub or use excessive force. Pat dry using gauze sponges, not tissue or cotton balls. Peri-Wound Care Topical Primary Dressing Silvercel Small 2x2 (in/in) Discharge Instruction: Apply Silvercel Small 2x2 (in/in) as instructed Secondary Dressing ABD Pad 5x9 (in/in) Discharge Instruction: Cover with ABD pad heel cup Discharge Instruction: apply over dressing to protect heel Secured With 37M Medipore H Soft Cloth Surgical Tape, 2x2 (in/yd) Coban Cohesive Bandage 4x5 (yds) Stretched Discharge Instruction: Apply Coban as directed. Kerlix Roll Sterile or Non-Sterile 6-ply 4.5x4 (yd/yd) Discharge Instruction: Apply Kerlix as directed Compression Wrap Compression Stockings Add-Ons Prevalon boot Discharge Instruction: use while in bed Electronic Signature(s) Signed: 10/28/2021 4:38:18 PM By: Levora Dredge Entered By: Levora Dredge on 10/28/2021 11:55:17 [name] ([id number]) -------------------------------------------------------------------------------- Vitals Details Patient Name: [name], [name]. Date of Service: 10/28/2021 11:15 AM Medical Record Number: [id number] Patient Account Number: [id number] Date of Birth/Sex: [birth year] (76 y.o. M) Treating RN: Levora Dredge Primary Care Yael Coppess: Fulton Reek Other Clinician: Referring Darrel Gloss: Fulton Reek Treating Kristjan Derner/Extender: Skipper Cliche in Treatment: 13 Vital Signs Time Taken: 11:45 Temperature (F): 98.3 Height (in): 69 Pulse (bpm): 61 Weight (lbs): 267 Respiratory Rate (breaths/min): 18 Body Mass Index (BMI): 39.4 Blood Pressure (mmHg): 89/48 Reference Range: 80 - 120 mg / dl Notes pt states feeling a little dizzy Electronic Signature(s) Signed: 10/28/2021 4:38:18 PM By: Levora Dredge Entered By: Levora Dredge on 10/28/2021 11:53:18

## 2021-11-04 ENCOUNTER — Other Ambulatory Visit: Payer: Self-pay

## 2021-11-04 DIAGNOSIS — E11621 Type 2 diabetes mellitus with foot ulcer: Secondary | ICD-10-CM | POA: Diagnosis not present

## 2021-11-05 NOTE — Progress Notes (Signed)
SEYMORE, BRODOWSKI ([id number]) Visit Report for 11/04/2021 Arrival Information Details Patient Name: [name], [name]. Date of Service: 11/04/2021 10:30 AM Medical Record Number: [id number] Patient Account Number: [id number] Date of Birth/Sex: 08/17/1946 (76 y.o. M) Treating RN: Levora Dredge Primary Care Jeson Camacho: Fulton Reek Other Clinician: Referring Sherrie Marsan: Fulton Reek Treating Stasia Somero/Extender: Skipper Cliche in Treatment: 38 Visit Information History Since Last Visit Added or deleted any medications: No Patient Arrived: [name] Any new allergies or adverse reactions: No Arrival Time: 10:32 Had a fall or experienced change in No Accompanied By: self activities of daily living that may affect Transfer Assistance: None risk of falls: Patient Identification Verified: Yes Hospitalized since last visit: No Secondary Verification Process Completed: Yes Has Dressing in Place as Prescribed: Yes Patient Has Alerts: Yes Has Footwear/Offloading in Place as Prescribed: Yes Patient Alerts: Patient on Blood Thinner Left: Surgical Shoe with Pressure Relief Coumadin Insole Diabetic Pain Present Now: No aspirin 81mg  Electronic Signature(s) Signed: 11/05/2021 9:12:36 AM By: Levora Dredge Entered By: Levora Dredge on 11/04/2021 10:37:18 [name] ([id number]) -------------------------------------------------------------------------------- Clinic Level of Care Assessment Details Patient Name: [name]. Date of Service: 11/04/2021 10:30 AM Medical Record Number: [id number] Patient Account Number: [id number] Date of Birth/Sex: [birth year] (76 y.o. M) Treating RN: Levora Dredge Primary Care Blakelyn Dinges: Fulton Reek Other Clinician: Referring Deyonte Cadden: Fulton Reek Treating Tajai Suder/Extender: Skipper Cliche in Treatment: 14 Clinic Level of Care Assessment Items TOOL 4 Quantity Score X - Use when only an EandM is performed on FOLLOW-UP visit 1  0 ASSESSMENTS - Nursing Assessment / Reassessment []  - Reassessment of Co-morbidities (includes updates in patient status) 0 []  - 0 Reassessment of Adherence to Treatment Plan ASSESSMENTS - Wound and Skin Assessment / Reassessment X - Simple Wound Assessment / Reassessment - one wound 1 5 []  - 0 Complex Wound Assessment / Reassessment - multiple wounds []  - 0 Dermatologic / Skin Assessment (not related to wound area) ASSESSMENTS - Focused Assessment []  - Circumferential Edema Measurements - multi extremities 0 []  - 0 Nutritional Assessment / Counseling / Intervention []  - 0 Lower Extremity Assessment (monofilament, tuning fork, pulses) []  - 0 Peripheral Arterial Disease Assessment (using hand held doppler) ASSESSMENTS - Ostomy and/or Continence Assessment and Care []  - Incontinence Assessment and Management 0 []  - 0 Ostomy Care Assessment and Management (repouching, etc.) PROCESS - Coordination of Care X - Simple Patient / Family Education for ongoing care 1 15 []  - 0 Complex (extensive) Patient / Family Education for ongoing care []  - 0 Staff obtains Programmer, systems, Records, Test Results / Process Orders []  - 0 Staff telephones HHA, Nursing Homes / Clarify orders / etc []  - 0 Routine Transfer to another Facility (non-emergent condition) []  - 0 Routine Hospital Admission (non-emergent condition) []  - 0 New Admissions / Biomedical engineer / Ordering NPWT, Apligraf, etc. []  - 0 Emergency Hospital Admission (emergent condition) X- 1 10 Simple Discharge Coordination []  - 0 Complex (extensive) Discharge Coordination PROCESS - Special Needs []  - Pediatric / Minor Patient Management 0 []  - 0 Isolation Patient Management []  - 0 Hearing / Language / Visual special needs []  - 0 Assessment of Community assistance (transportation, D/C planning, etc.) []  - 0 Additional assistance / Altered mentation []  - 0 Support Surface(s) Assessment (bed, cushion, seat,  etc.) INTERVENTIONS - Wound Cleansing / Measurement [name], [name]. ([id number]) X- 1 5 Simple Wound Cleansing - one wound []  - 0 Complex Wound Cleansing - multiple wounds []  - 0 Wound Imaging (photographs - any  number of wounds) []  - 0 Wound Tracing (instead of photographs) []  - 0 Simple Wound Measurement - one wound []  - 0 Complex Wound Measurement - multiple wounds INTERVENTIONS - Wound Dressings X - Small Wound Dressing one or multiple wounds 1 10 []  - 0 Medium Wound Dressing one or multiple wounds []  - 0 Large Wound Dressing one or multiple wounds []  - 0 Application of Medications - topical []  - 0 Application of Medications - injection INTERVENTIONS - Miscellaneous []  - External ear exam 0 []  - 0 Specimen Collection (cultures, biopsies, blood, body fluids, etc.) []  - 0 Specimen(s) / Culture(s) sent or taken to Lab for analysis []  - 0 Patient Transfer (multiple staff / Civil Service fast streamer / Similar devices) []  - 0 Simple Staple / Suture removal (25 or less) []  - 0 Complex Staple / Suture removal (26 or more) []  - 0 Hypo / Hyperglycemic Management (close monitor of Blood Glucose) []  - 0 Ankle / Brachial Index (ABI) - do not check if billed separately []  - 0 Vital Signs Has the patient been seen at the hospital within the last three years: Yes Total Score: 45 Level Of Care: New/Established - Level 2 Electronic Signature(s) Signed: 11/05/2021 9:12:36 AM By: Levora Dredge Entered By: Levora Dredge on 11/04/2021 10:55:38 [name] ([id number]) -------------------------------------------------------------------------------- Encounter Discharge Information Details Patient Name: [name], [name]. Date of Service: 11/04/2021 10:30 AM Medical Record Number: [id number] Patient Account Number: [id number] Date of Birth/Sex: [birth year] (76 y.o. M) Treating RN: Levora Dredge Primary Care Ziasia Lenoir: Fulton Reek Other Clinician: Referring Crystalle Popwell: Fulton Reek Treating Macauley Mossberg/Extender: Skipper Cliche in Treatment: 14 Encounter Discharge Information Items Discharge Condition: Stable Ambulatory Status: Cane Discharge Destination: Home Transportation: Private Auto Accompanied By: self Schedule Follow-up Appointment: Yes Clinical Summary of Care: Notes pt tolerated dressing change without issue. Electronic Signature(s) Signed: 11/05/2021 9:12:36 AM By: Levora Dredge Entered By: Levora Dredge on 11/04/2021 10:55:20 [name], [name] ([id number]) -------------------------------------------------------------------------------- Wound Assessment Details Patient Name: [name], [name]. Date of Service: 11/04/2021 10:30 AM Medical Record Number: [id number] Patient Account Number: [id number] Date of Birth/Sex: Sep 22, 1946 (76 y.o. M) Treating RN: Levora Dredge Primary Care Felesia Stahlecker: Fulton Reek Other Clinician: Referring Tashay Bozich: Fulton Reek Treating Ahmod Gillespie/Extender: Skipper Cliche in Treatment: 14 Wound Status Wound Number: 9 Primary Diabetic Wound/Ulcer of the Lower Extremity Etiology: Wound Location: Left Calcaneus Wound Open Wounding Event: Gradually Appeared Status: Date Acquired: 07/07/2021 Comorbid Cataracts, Arrhythmia, Coronary Artery Disease, Weeks Of Treatment: 14 History: Hypertension, Type II Diabetes, Osteoarthritis, Clustered Wound: No Neuropathy Wound Measurements Length: (cm) 1.3 Width: (cm) 1 Depth: (cm) 0.1 Area: (cm) 1.021 Volume: (cm) 0.102 % Reduction in Area: 53.6% % Reduction in Volume: 53.6% Epithelialization: None Tunneling: No Undermining: No Wound Description Classification: Grade 2 Wound Margin: Thickened Exudate Amount: Large Exudate Type: Purulent Exudate Color: yellow, brown, green Foul Odor After Cleansing: No Slough/Fibrino Yes Wound Bed Granulation Amount: Small (1-33%) Exposed Structure Granulation Quality: Pink Fascia Exposed: No Necrotic Amount: Medium  (34-66%) Fat Layer (Subcutaneous Tissue) Exposed: Yes Necrotic Quality: Adherent Slough Tendon Exposed: No Muscle Exposed: No Joint Exposed: No Bone Exposed: No Treatment Notes Wound #9 (Calcaneus) Wound Laterality: Left Cleanser Normal Saline Discharge Instruction: Wash your hands with soap and water. Remove old dressing, discard into plastic bag and place into trash. Cleanse the wound with Normal Saline prior to applying a clean dressing using gauze sponges, not tissues or cotton balls. Do not scrub or use excessive force. Pat dry using gauze sponges, not tissue or cotton  balls. Peri-Wound Care Topical Primary Dressing Silvercel Small 2x2 (in/in) Discharge Instruction: Apply Silvercel Small 2x2 (in/in) as instructed Secondary Dressing ABD Pad 5x9 (in/in) Discharge Instruction: Cover with ABD pad heel cup Discharge Instruction: apply over dressing to protect heel [name], [name] ([id number]) Secured With 69M Medipore H Soft Cloth Surgical Tape, 2x2 (in/yd) Coban Cohesive Bandage 4x5 (yds) Stretched Discharge Instruction: Apply Coban as directed. Kerlix Roll Sterile or Non-Sterile 6-ply 4.5x4 (yd/yd) Discharge Instruction: Apply Kerlix as directed Compression Wrap Compression Stockings Add-Ons Prevalon boot Discharge Instruction: use while in bed Electronic Signature(s) Signed: 11/05/2021 9:12:36 AM By: Levora Dredge Entered By: Levora Dredge on 11/04/2021 10:42:16

## 2021-11-05 NOTE — Progress Notes (Signed)
TYREECE, GELLES ([id number]) Visit Report for 11/04/2021 Physician Orders Details Patient Name: [name], [name]. Date of Service: 11/04/2021 10:30 AM Medical Record Number: [id number] Patient Account Number: [id number] Date of Birth/Sex: [birth year] (76 y.o. M) Treating RN: Levora Dredge Primary Care Provider: Fulton Reek Other Clinician: Referring Provider: Fulton Reek Treating Provider/Extender: Skipper Cliche in Treatment: [phone] Verbal / Phone Orders: No Diagnosis Coding Follow-up Appointments o Return Appointment in 1 week. o Nurse Visit as needed - call to schedule Bathing/ Shower/ Hygiene o May shower with wound dressing protected with water repellent cover or cast protector. o No tub bath. Edema Control - Lymphedema / Segmental Compressive Device / Other o Patient to wear own compression stockings. Remove compression stockings every night before going to bed and put on every morning when getting up. - right leg o Elevate leg(s) parallel to the floor when sitting. o DO YOUR BEST to sleep in the bed at night. DO NOT sleep in your recliner. Long hours of sitting in a recliner leads to swelling of the legs and/or potential wounds on your backside. Off-Loading o Other: - L heel offloader shoe Keep pressure off of heel in the bed-float heel in [place] order from [employer], see sheet Additional Orders / Instructions o Follow Nutritious Diet and Increase Protein Intake - check blood sugar and eat protein for wound healing Wound Treatment Wound #9 - Calcaneus Wound Laterality: Left Cleanser: Normal Saline Every Other Day/30 Days Discharge Instructions: Wash your hands with soap and water. Remove old dressing, discard into plastic bag and place into trash. Cleanse the wound with Normal Saline prior to applying a clean dressing using gauze sponges, not tissues or cotton balls. Do not scrub or use excessive force. Pat dry using gauze sponges, not  tissue or cotton balls. Primary Dressing: Silvercel Small 2x2 (in/in) (Generic) Every Other Day/30 Days Discharge Instructions: Apply Silvercel Small 2x2 (in/in) as instructed Secondary Dressing: ABD Pad 5x9 (in/in) Every Other Day/30 Days Discharge Instructions: Cover with ABD pad Secondary Dressing: heel cup Every Other Day/30 Days Discharge Instructions: apply over dressing to protect heel Secured With: 23M Medipore H Soft Cloth Surgical Tape, 2x2 (in/yd) (Generic) Every Other Day/30 Days Secured With: Coban Cohesive Bandage 4x5 (yds) Stretched Every Other Day/30 Days Discharge Instructions: Apply Coban as directed. Secured With: [employer] or Non-Sterile 6-ply 4.5x4 (yd/yd) (Generic) Every Other Day/30 Days Discharge Instructions: Apply Kerlix as directed Add-Ons: Prevalon boot Every Other Day/30 Days Discharge Instructions: use while in bed Electronic Signature(s) DOIS, JUARBE ([id number]) Signed: 11/05/2021 9:12:36 AM By: Levora Dredge Signed: 11/05/2021 4:48:19 PM By: Worthy Keeler PA-C Previous Signature: 11/04/2021 10:24:24 AM Version By: Levora Dredge Entered By: Levora Dredge on 11/04/2021 10:54:33 RENZO, VINCELETTE ([id number]) -------------------------------------------------------------------------------- SuperBill Details Patient Name: [name]. Date of Service: 11/04/2021 Medical Record Number: [id number] Patient Account Number: [id number] Date of Birth/Sex: [birth year] (76 y.o. 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

## 2021-11-11 ENCOUNTER — Other Ambulatory Visit: Payer: Self-pay

## 2021-11-11 ENCOUNTER — Encounter: Payer: Medicare HMO | Admitting: Physician Assistant

## 2021-11-11 DIAGNOSIS — E11621 Type 2 diabetes mellitus with foot ulcer: Secondary | ICD-10-CM | POA: Diagnosis not present

## 2021-11-11 NOTE — Progress Notes (Addendum)
KHAMBREL, AMSDEN ([id number]) Visit Report for 11/11/2021 Chief Complaint Document Details Patient Name: [name], [name]. Date of Service: 11/11/2021 9:00 AM Medical Record Number: [id number] Patient Account Number: [id number] Date of Birth/Sex: [birth year] (76 y.o. M) Treating RN: Levora Dredge Primary Care Provider: Fulton Reek Other Clinician: Referring Provider: Fulton Reek Treating Provider/Extender: Skipper Cliche in Treatment: 15 Information Obtained from: Patient Chief Complaint Right LE ulcers and cellulitis Electronic Signature(s) Signed: 11/11/2021 9:08:46 AM By: Worthy Keeler PA-C Entered By: Worthy Keeler on 11/11/2021 09:08:46 [name], [name] ([id number]) -------------------------------------------------------------------------------- HPI Details Patient Name: [name], [name]. Date of Service: 11/11/2021 9:00 AM Medical Record Number: [id number] Patient Account Number: [id number] Date of Birth/Sex: April 27, 1946 (76 y.o. M) Treating RN: Levora Dredge Primary Care Provider: Fulton Reek Other Clinician: Referring Provider: Fulton Reek Treating Provider/Extender: Skipper Cliche in Treatment: 15 History of Present Illness HPI Description: 09/24/2020 on evaluation today patient presents today for a heel ulcer that he tells me has been present for about 2 years. He has been seeing podiatry and they have been attempting to manage this including what sounds to be a total contact cast, Unna boot, and just standard dressings otherwise as well. Most recently has been using triple antibiotic ointment. With that being said unfortunately despite everything he really has not had any significant improvement. He tells me that he cannot even really remember exactly how this began but he presumed it may have rubbed on his shoes or something of that nature. 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 He tells me that this does seem to help. Fortunately there is no signs of active infection at this time. His most recent hemoglobin A1c was 8.1 though he knows this was this year he cannot tell me the exact time. His fluid pills currently to help with some of the lower extremity edema although he does obviously have signs of venous stasis/lymphedema. Currently there is no evidence of active infection. No fevers, chills, nausea, vomiting, or diarrhea. Patient has had fairly recent ABIs which were performed on 07/19/2020 and revealed that he has normal findings in both the ankle and toe locations bilaterally. His ABI on the right was 1.09 on the left was 1.08 with a TBI on the right of 0.88 and on the left of 0.94. Triphasic flow was noted throughout. 10/08/2020 on evaluation today patient appears to be doing pretty well in regard to his left heel currently in fact this is doing a great job and seems to be healing quite nicely. Unfortunately on his right leg he had a pile of wood that actually fell on him injuring his right leg this is somewhat erythematous has me concerned little bit about cellulitis though there is not really a good area to culture at this point. 10/24/2020 upon evaluation today patient appears to be doing well with regard to his heel ulcer. He is showing signs of improvement which is great news. His right leg is completely healed. Overall I feel like he is doing excellent and there is no signs of infection. 11/07/2020 upon evaluation today patient appears to be doing well with regard to his heel ulcer. He tells me that last week when he was unable to  come in his wife actually thought that the wound was very close to closing if not closed. Then it began to "reopen again". I  really feel like what may have happened as the collagen may have dried over the wound bed and that because that misunderstanding with thinking that the wound was healing. With that being said I did not see it last week I do not know that for certain. Either way I feel like he is doing great today I see no signs of infection at this point. 11/26/2020 upon inspection today patient appears to be doing decently well in regard to his heel ulcer. He has been tolerating the dressing changes without complication. Fortunately there is no sign of active infection at this time. No fevers, chills, nausea, vomiting, or diarrhea. 12/10/2020 upon evaluation today patient appears to be doing fairly well in regard to the wound on his heel as well as what appears to be a new wound of the left first metatarsal head plantar aspect. This seems to be an area that was callus that has split as the patient tells me has been trying to walk on his toes more has probably where this came from. With that being said there does not appear to be signs of active infection which is great news. 12/17/2020 upon evaluation today patient actually appears to be making good progress currently. Fortunately there is no evidence of active infection at this time. Overall I feel like he is very close to complete closure. 12/26/2020 upon evaluation today patient appears to be doing excellent in regard to his wounds. In fact I am not certain that these are not even completely healed on initial inspection. Overall I am very pleased with where things stand today. Good news is that they are healed he is actually get ready to go out of town and that will be helpful as well as he will be a full part of the time. Readmission: 02/04/2021 upon evaluation today patient appears to be doing well at this point in regard to his left heel that I previously saw him for. Unfortunately he is having issues with his right lower extremity. He has significant wounds at this  point he also has erythema noted there is definite signs of cellulitis which is unfortunate. With that being said I think we do need to address this sooner rather than later. The good news is he did have a nice trip to the beach. He tells me that he had no issues during that time. 4/27; patient on Bactrim. Culture showed methicillin sensitive staph aureus therefore the Bactrim should be effective. 2 small areas on the right leg are healed the area on the mid aspect of the tibia almost 100% covered in a very adherent necrotic debris. We have been using silver alginate 02/20/2021 upon evaluation today patient appears to be doing well with regard to his wound. He is showing signs of improvement which is great news overall very pleased with where things stand today. No fevers, chills, nausea, vomiting, or diarrhea. 02/27/2021 upon evaluation today patient appears to be doing well with regard to his leg ulcer. He is tolerating the dressing changes and overall appears to be doing quite excellent. I am extremely pleased with where things stand and overall I think patient is making great progress. There is no sign of active infection at this time which is also great news. 03/06/2021 upon evaluation today patient appears to be doing excellent in regard to his wounds. In fact he appears  to be completely healed today based on what I am seeing. This is excellent news and overall I am extremely pleased with where he stands. Overall the patient is happy to hear this as well this has been quite sometime coming. Readmission: 04/01/2021 patient unfortunately returns for readmission today. He tells me that he has been wearing his compression socks on the right leg daily [name], [name] ([id number]) and he does not really know what is going on and why his legs are doing what they are doing. We did therefore go ahead and probe deeper into exactly what has been going on with him today. Subsequently the patient tells me  that when he gets up and what we would call "first thing in the morning" are really 2 different things". He does not tend to sleep well so he tells me that he will often wake up at 3:00 in the morning. He will then potentially going into the living room to get in his chair where he may read a book for a little while and then potentially fall asleep back in his chair. He then subsequently wake up around 5:00 or so and then get up and in his words "putter around". This often will end with him proceeding at some point around 8 AM or 9 AM to putting on his compression socks. With that being said this means that anywhere from the 3:00 in the morning till roughly around 9:00 in the morning he has no compression on yet he is sitting in his recliner, walking around and up and about, and this is at least 5 to 6 hours of noncompressed time. That may be our issue here but I did not realize until we discussed this further today. Fortunately there does not appear to be any signs of active infection at this time which is great news. With that being said he has multiple wounds of the bilateral lower extremities. 04/10/2021 upon evaluation today patient appears to be doing well with regard to his legs for the most part. He does look like he had some injury where his wrap slid down nonetheless he did some "doctoring on them". I do feel like most of the areas honestly have cleared back up which is great news. There does not appear to be any signs of active infection at this time which is also great news. No fevers, chills, nausea, vomiting, or diarrhea. 04/18/2021 upon evaluation today patient appears to be doing well with regard to his wounds in fact he appears to be completely healed which is great news. Fortunately there does not appear to be any signs of active infection which is great news. No fevers, chills, nausea, vomiting, or diarrhea. READMISSION 07/28/2021 This is a now 76 year old man we have had in this  clinic for quite a bit of this year. 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 He wears compression stockings. 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 He comes in an old running shoes although he says he wears better shoes at home. His history is that he noticed a blister in the right posterior heel. This open. He has been applying Neosporin to it currently the wound measures 2 x 1.4 cm. 100% slough covered. Not really offloading this in any rigorous way. Past medical history is essentially unchanged he has paroxysmal atrial fibrillation on Coumadin type 2 diabetes with a recent hemoglobin A1c of 7 on metformin. ABI  in our clinic was 0.98 on the right 08/05/2021 upon evaluation today patient appears to be doing well with regard to his heel ulcer. Fortunately there does not appear to be any signs of active infection at this time. Overall I been very pleased with where things seem to be currently as far as the wound healing is concerned. Again this is first a lot of seeing him Dr. Dellia Nims readmitted him last week. Nonetheless I think we are definitely making some progress here. 08/11/2021 upon evaluation today patient's wound on the heel actually showing signs of excellent improvement. I am actually very pleased with where things stand currently. No fevers, chills, nausea, vomiting, or diarrhea. There does not appear to be any need for sharp debridement today either which is also great news. 08/25/2021 upon evaluation today patient appears to be doing well with regard to his heel ulcer. This is actually looking significantly improved compared to last time I saw him. Fortunately there does not appear to be any evidence of active infection at this time. No fevers, chills, nausea, vomiting, or diarrhea. 09/01/2021 upon evaluation today patient appears to be doing decently well in regard to his  wounds. Fortunately there does not appear to be any signs of active infection at this time which is great news and overall very pleased in that regard. I do not see any evidence of active infection systemically which is great news as well. No fevers, chills, nausea, vomiting, or diarrhea. I think that the heel is making excellent progress. 09/15/2021 upon evaluation today patient's heel unfortunately is significantly worse compared to what it was previous. I do believe that at this time he would benefit from switching back to something a little bit more offloading from the cushion shoe he has right now this is more of a heel protector what I really need is a Prevalon offloading boot which I think is good to do a lot better for him than just the small heel protector. 09/23/2021 upon evaluation today patient appears to be doing a little better in regards to last week's visit. Overall I think that he is making good progress which is great news and there does not appear to be any evidence of active infection at this time. No fevers, chills, nausea, vomiting, or diarrhea. 09/30/2021 upon evaluation patient's heel is actually showing signs of good improvement which is great news. Fortunately there does not appear to be any evidence of active infection locally nor systemically at this point. No fevers, chills, nausea, vomiting, or diarrhea. 10/07/2021 upon evaluation today patient appears to be doing well currently in regard to his heel ulcer. He has been tolerating the dressing changes without complication. Fortunately I do not see any signs of active infection at this time which is great news. No fevers, chills, nausea, vomiting, or diarrhea. 12/27; wound is measuring smaller. We have been using silver alginate 10/28/2021 upon evaluation today patient appears to be doing well currently in regard to his heel ulcer. I am actually very pleased with where things stand and I think he is making excellent  progress. Fortunately I do not see any signs of active infection locally nor systemically at this point which is great news. Nonetheless I do believe that the patient would benefit from a new offloading shoe and has been using it Velcro is no longer functioning properly and he tells me that he almost tripped and fell because of that today. Obviously I do not want him following that would be very bad. 11/11/2021  upon evaluation today patient appears to be doing excellent in regard to his wound. In fact this appears to be completely healed based on what I see currently. I do not see any signs of anything open or draining and I did double check just by clearing away some of the callus around the edges of the wound to ensure that there was nothing still open or hiding underneath. Once this was cleared away it appeared that the patient was doing significantly better at this time which is great news and there was nothing actually open. Electronic Signature(s) JASEAN, AMBROSIA ([id number]) Signed: 11/11/2021 11:04:34 AM By: Worthy Keeler PA-C Entered By: Worthy Keeler on 11/11/2021 11:04:33 [name], [name] ([id number]) -------------------------------------------------------------------------------- Callus Pairing Details Patient Name: ORBY, TANGEN Date of Service: 11/11/2021 9:00 AM Medical Record Number: [id number] Patient Account Number: [id number] Date of Birth/Sex: [birth year] (77 y.o. M) Treating RN: Levora Dredge Primary Care Provider: Fulton Reek Other Clinician: Referring Provider: Fulton Reek Treating Provider/Extender: Skipper Cliche in Treatment: 15 Procedure Performed for: Wound #9 Left Calcaneus Performed By: Physician Tommie Sams., PA-C Post Procedure Diagnosis Same as Pre-procedure Notes callus removed no open wound remaining Electronic Signature(s) Signed: 11/11/2021 4:34:33 PM By: Levora Dredge Entered By: Levora Dredge on 11/11/2021  10:12:58 [name] ([id number]) -------------------------------------------------------------------------------- Physical Exam Details Patient Name: [name], [name]. Date of Service: 11/11/2021 9:00 AM Medical Record Number: [id number] Patient Account Number: [id number] Date of Birth/Sex: 11/09/1945 (76 y.o. M) Treating RN: Levora Dredge Primary Care Provider: Fulton Reek Other Clinician: Referring Provider: Fulton Reek Treating Provider/Extender: Skipper Cliche in Treatment: 8 Constitutional Well-nourished and well-hydrated in no acute distress. Respiratory normal breathing without difficulty. Psychiatric this patient is able to make decisions and demonstrates good insight into disease process. Alert and Oriented x 3. pleasant and cooperative. Notes Patient's wound again showed complete epithelization and overall I think that were doing well in that regard I would recommend he continue to protect this for the next couple of weeks after that time I thinking of active issues in a normal shoe and ambulating as before. Electronic Signature(s) Signed: 11/11/2021 11:05:03 AM By: Worthy Keeler PA-C Entered By: Worthy Keeler on 11/11/2021 11:05:02 [name], [name] ([id number]) -------------------------------------------------------------------------------- Physician Orders Details Patient Name: [name], [name]. Date of Service: 11/11/2021 9:00 AM Medical Record Number: [id number] Patient Account Number: [id number] Date of Birth/Sex: [birth year] (76 y.o. 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 After two weeks should no longer need to dress healed wound. Please do call if any issues arise Edema Control - Lymphedema / Segmental Compressive Device / Other o Patient to wear own compression stockings. Remove compression stockings every night before going to bed and put on every morning when getting up. - right leg o Elevate leg(s) parallel to the floor when sitting. o DO YOUR BEST to sleep in the bed at night. DO NOT sleep in your recliner. Long hours of sitting in a recliner leads to swelling of the  legs and/or potential wounds on your backside. Off-Loading o Other: - L heel offloader shoe Keep pressure off of heel in the bed-float heel in [place] order from [employer], see sheet Additional Orders / Instructions o Follow Nutritious Diet and Increase Protein Intake - check blood sugar and eat protein for wound healing Electronic Signature(s) Signed: 11/11/2021 4:34:33 PM By: Levora Dredge Signed: 11/11/2021 6:21:10 PM By: Worthy Keeler PA-C Entered By: Levora Dredge on 11/11/2021 10:16:16 [name] ([id number]) -------------------------------------------------------------------------------- Problem List Details Patient Name: [name], [name]. Date of Service: 11/11/2021 9:00 AM Medical Record Number: [id number] Patient Account Number: [id number] Date of Birth/Sex: Jul 15, 1946 (76 y.o. M) Treating RN: Levora Dredge Primary Care Provider: Fulton Reek Other Clinician: Referring Provider: Fulton Reek Treating Provider/Extender: Skipper Cliche in Treatment: 15 Active Problems ICD-10 Encounter Code Description Active Date MDM Diagnosis E11.621 Type 2 diabetes mellitus with foot ulcer 07/28/2021 No Yes L97.421 Non-pressure chronic ulcer  of left heel and midfoot limited to breakdown 07/28/2021 No Yes of skin E11.42 Type 2 diabetes mellitus with diabetic polyneuropathy 07/28/2021 No Yes Inactive Problems Resolved Problems Electronic Signature(s) Signed: 11/11/2021 9:08:41 AM By: Worthy Keeler PA-C Entered By: Worthy Keeler on 11/11/2021 09:08:41 [name] ([id number]) -------------------------------------------------------------------------------- Progress Note Details Patient Name: [name]. Date of Service: 11/11/2021 9:00 AM Medical Record Number: [id number] Patient Account Number: [id number] Date of Birth/Sex: [birth year] (76 y.o. M) Treating RN: Levora Dredge Primary Care Provider: Fulton Reek Other Clinician: Referring Provider: Fulton Reek Treating Provider/Extender: Skipper Cliche in Treatment: 15 Subjective Chief Complaint Information obtained from Patient Right LE ulcers and cellulitis History of Present Illness (HPI) 09/24/2020 on evaluation today patient presents today for a heel ulcer that he tells me has been present for about 2 years. He has been seeing podiatry and they have been attempting to manage this including what sounds to be a total contact cast, Unna boot, and just standard dressings otherwise as well. Most recently has been using triple antibiotic ointment. With that being said unfortunately despite everything he really has not had any significant improvement. He tells me that he cannot even really remember exactly how this began but he presumed it may have rubbed on his shoes or something of that nature. 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 He tells me that this does seem to help.  Fortunately there is no signs of active infection at this time. His most recent hemoglobin A1c was 8.1 though he knows this was this year he cannot tell me the exact time. His fluid pills currently to help with some of the lower extremity edema although he does obviously have signs of venous stasis/lymphedema. Currently there is no evidence of active infection. No fevers, chills, nausea, vomiting, or diarrhea. Patient has had fairly recent ABIs which were performed on 07/19/2020 and revealed that he has normal findings in both the ankle and toe locations bilaterally. His ABI on the right was 1.09 on the left was 1.08 with a TBI on the right of 0.88 and on the left of 0.94. Triphasic flow was noted throughout. 10/08/2020 on evaluation today patient appears to be doing pretty well in regard to his left heel currently in fact this  is doing a great job and seems to be healing quite nicely. Unfortunately on his right leg he had a pile of wood that actually fell on him injuring his right leg this is somewhat erythematous has me concerned little bit about cellulitis though there is not really a good area to culture at this point. 10/24/2020 upon evaluation today patient appears to be doing well with regard to his heel ulcer. He is showing signs of improvement which is great news. His right leg is completely healed. Overall I feel like he is doing excellent and there is no signs of infection. 11/07/2020 upon evaluation today patient appears to be doing well with regard to his heel ulcer. He tells me that last week when he was unable to come in his wife actually thought that the wound was very close to closing if not closed. Then it began to "reopen again". I really feel like what may have happened as the collagen may have dried over the wound bed and that because that misunderstanding with thinking that the wound was healing. With that being said I did not see it last week I do not know that for certain. Either  way I feel like he is doing great today I see no signs of infection at this point. 11/26/2020 upon inspection today patient appears to be doing decently well in regard to his heel ulcer. He has been tolerating the dressing changes without complication. Fortunately there is no sign of active infection at this time. No fevers, chills, nausea, vomiting, or diarrhea. 12/10/2020 upon evaluation today patient appears to be doing fairly well in regard to the wound on his heel as well as what appears to be a new wound of the left first metatarsal head plantar aspect. This seems to be an area that was callus that has split as the patient tells me has been trying to walk on his toes more has probably where this came from. With that being said there does not appear to be signs of active infection which is great news. 12/17/2020 upon evaluation today patient actually appears to be making good progress currently. Fortunately there is no evidence of active infection at this time. Overall I feel like he is very close to complete closure. 12/26/2020 upon evaluation today patient appears to be doing excellent in regard to his wounds. In fact I am not certain that these are not even completely healed on initial inspection. Overall I am very pleased with where things stand today. Good news is that they are healed he is actually get ready to go out of town and that will be helpful as well as he will be a full part of the time. Readmission: 02/04/2021 upon evaluation today patient appears to be doing well at this point in regard to his left heel that I previously saw him for. Unfortunately he is having issues with his right lower extremity. He has significant wounds at this point he also has erythema noted there is definite signs of cellulitis which is unfortunate. With that being said I think we do need to address this sooner rather than later. The good news is he did have a nice trip to the beach. He tells me that he had no  issues during that time. 4/27; patient on Bactrim. Culture showed methicillin sensitive staph aureus therefore the Bactrim should be effective. 2 small areas on the right leg are healed the area on the mid aspect of the tibia almost 100% covered in a very  adherent necrotic debris. We have been using silver alginate 02/20/2021 upon evaluation today patient appears to be doing well with regard to his wound. He is showing signs of improvement which is great news overall very pleased with where things stand today. No fevers, chills, nausea, vomiting, or diarrhea. 02/27/2021 upon evaluation today patient appears to be doing well with regard to his leg ulcer. He is tolerating the dressing changes and overall appears to be doing quite excellent. I am extremely pleased with where things stand and overall I think patient is making great progress. There is no sign of active infection at this time which is also great news. 03/06/2021 upon evaluation today patient appears to be doing excellent in regard to his wounds. In fact he appears to be completely healed today based on what I am seeing. This is excellent news and overall I am extremely pleased with where he stands. Overall the patient is happy to hear [name], [name] ([id number]) this as well this has been quite sometime coming. Readmission: 04/01/2021 patient unfortunately returns for readmission today. He tells me that he has been wearing his compression socks on the right leg daily and he does not really know what is going on and why his legs are doing what they are doing. We did therefore go ahead and probe deeper into exactly what has been going on with him today. Subsequently the patient tells me that when he gets up and what we would call "first thing in the morning" are really 2 different things". He does not tend to sleep well so he tells me that he will often wake up at 3:00 in the morning. He will then potentially going into the living room to get  in his chair where he may read a book for a little while and then potentially fall asleep back in his chair. He then subsequently wake up around 5:00 or so and then get up and in his words "putter around". This often will end with him proceeding at some point around 8 AM or 9 AM to putting on his compression socks. With that being said this means that anywhere from the 3:00 in the morning till roughly around 9:00 in the morning he has no compression on yet he is sitting in his recliner, walking around and up and about, and this is at least 5 to 6 hours of noncompressed time. That may be our issue here but I did not realize until we discussed this further today. Fortunately there does not appear to be any signs of active infection at this time which is great news. With that being said he has multiple wounds of the bilateral lower extremities. 04/10/2021 upon evaluation today patient appears to be doing well with regard to his legs for the most part. He does look like he had some injury where his wrap slid down nonetheless he did some "doctoring on them". I do feel like most of the areas honestly have cleared back up which is great news. There does not appear to be any signs of active infection at this time which is also great news. No fevers, chills, nausea, vomiting, or diarrhea. 04/18/2021 upon evaluation today patient appears to be doing well with regard to his wounds in fact he appears to be completely healed which is great news. Fortunately there does not appear to be any signs of active infection which is great news. No fevers, chills, nausea, vomiting, or diarrhea. READMISSION 07/28/2021 This is a now 76 year old man we  have had in this clinic for quite a bit of this year. 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 He wears compression stockings. 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 He comes in an old running shoes although he says he wears better shoes at home. His history is that he noticed a blister in the right posterior heel. This open. He has been applying Neosporin to it currently the wound measures 2 x 1.4 cm. 100% slough covered. Not really offloading this in any rigorous way. Past medical history is essentially unchanged he has paroxysmal atrial fibrillation on Coumadin type 2 diabetes with a recent hemoglobin A1c of 7 on metformin. ABI in our clinic was 0.98 on the right 08/05/2021 upon evaluation today patient appears to be doing well with regard to his heel ulcer. Fortunately there does not appear to be any signs of active infection at this time. Overall I been very pleased with where things seem to be currently as far as the wound healing is concerned. Again this is first a lot of seeing him Dr. Dellia Nims readmitted him last week. Nonetheless I think we are definitely making some progress here. 08/11/2021 upon evaluation today patient's wound on the heel actually showing signs of excellent improvement. I am actually very pleased with where things stand currently. No fevers, chills, nausea, vomiting, or diarrhea. There does not appear to be any need for sharp debridement today either which is also great news. 08/25/2021 upon evaluation today patient appears to be doing well with regard to his heel ulcer. This is actually looking significantly improved compared to last time I saw him. Fortunately there does not appear to be any evidence of active infection at this time. No fevers, chills, nausea, vomiting, or diarrhea. 09/01/2021 upon evaluation today patient appears to be doing decently well in regard to his wounds. Fortunately there does not appear to be any signs of active infection at this time which is great news and overall very pleased in that regard. I do not see any evidence of active infection systemically which is great news as well. No fevers,  chills, nausea, vomiting, or diarrhea. I think that the heel is making excellent progress. 09/15/2021 upon evaluation today patient's heel unfortunately is significantly worse compared to what it was previous. I do believe that at this time he would benefit from switching back to something a little bit more offloading from the cushion shoe he has right now this is more of a heel protector what I really need is a Prevalon offloading boot which I think is good to do a lot better for him than just the small heel protector. 09/23/2021 upon evaluation today patient appears to be doing a little better in regards to last week's visit. Overall I think that he is making good progress which is great news and there does not appear to be any evidence of active infection at this time. No fevers, chills, nausea, vomiting, or diarrhea. 09/30/2021 upon evaluation patient's heel is actually showing signs of good improvement which is great news. Fortunately there does not appear to be any evidence of active infection locally nor systemically at this point. No fevers, chills, nausea, vomiting, or diarrhea. 10/07/2021 upon evaluation today patient appears to be doing well currently in regard to his heel ulcer. He has been tolerating the dressing changes without complication. Fortunately I do not see any signs of active infection at this  time which is great news. No fevers, chills, nausea, vomiting, or diarrhea. 12/27; wound is measuring smaller. We have been using silver alginate 10/28/2021 upon evaluation today patient appears to be doing well currently in regard to his heel ulcer. I am actually very pleased with where things stand and I think he is making excellent progress. Fortunately I do not see any signs of active infection locally nor systemically at this point which is great news. Nonetheless I do believe that the patient would benefit from a new offloading shoe and has been using it Velcro is no  longer functioning properly and he tells me that he almost tripped and fell because of that today. Obviously I do not want him following that would be very bad. 11/11/2021 upon evaluation today patient appears to be doing excellent in regard to his wound. In fact this appears to be completely healed based on what I see currently. I do not see any signs of anything open or draining and I did double check just by clearing away some of the callus [name], [name]. ([id number]) around the edges of the wound to ensure that there was nothing still open or hiding underneath. Once this was cleared away it appeared that the patient was doing significantly better at this time which is great news and there was nothing actually open. Objective Constitutional Well-nourished and well-hydrated in no acute distress. Vitals Time Taken: 9:18 AM, Height: 69 in, Weight: 267 lbs, BMI: 39.4, Temperature: 97.8 F, Pulse: 68 bpm, Respiratory Rate: 18 breaths/min, Blood Pressure: 96/57 mmHg. Respiratory normal breathing without difficulty. Psychiatric this patient is able to make decisions and demonstrates good insight into disease process. Alert and Oriented x 3. pleasant and cooperative. General Notes: Patient's wound again showed complete epithelization and overall I think that were doing well in that regard I would recommend he continue to protect this for the next couple of weeks after that time I thinking of active issues in a normal shoe and ambulating as before. Integumentary (Hair, Skin) Wound #9 status is Open. Original cause of wound was Gradually Appeared. The date acquired was: 07/07/2021. The wound has been in treatment 15 weeks. The wound is located on the Left Calcaneus. The wound measures 0cm length x 0cm width x 0cm depth; 0cm^2 area and 0cm^3 volume. There is Fat Layer (Subcutaneous Tissue) exposed. There is no tunneling or undermining noted. There is a none present amount of drainage noted. The  wound margin is thickened. There is no granulation within the wound bed. There is no necrotic tissue within the wound bed. 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 An Callus Pairing procedure was performed by Tommie Sams., PA-C. Post procedure Diagnosis Wound #9: Same as Pre-Procedure Notes: callus removed no open wound remaining Plan Discharge From [facility] Services: Discharge from [facility] [place] [facility] Complete - for the next 2 weeks, protect healed area with abd pad, heel cup and kerlix. After two weeks should no longer need to dress healed wound. Please do call if any issues arise Edema Control - Lymphedema / Segmental Compressive Device / Other: Patient to wear own compression stockings. Remove compression stockings every night before going to bed and put on every morning when getting up. -  right leg Elevate leg(s) parallel to the floor when sitting. ISAAH, FURRY ([id number]) DO YOUR BEST to sleep in the bed at night. DO NOT sleep in your recliner. Long hours of sitting in a recliner leads to swelling of the legs and/or potential wounds on your backside. Off-Loading: Other: - L heel offloader shoe Keep pressure off of heel in the bed-float heel in [place] order from [employer], see sheet Additional Orders / Instructions: Follow Nutritious Diet and Increase Protein Intake - check blood sugar and eat protein for wound healing 1. I Georgina Peer suggest again that he protect the foot over the next couple of weeks with the dressing he does not need to use the silver alginate but I would still continue with the ABD pad and the heel cup for protection. 2. I am also can recommend  that he closely monitor this to make sure nothing is trying to breakdown or open up if anything changes he should let me know soon as possible. We will see him back for follow-up visit as needed. Electronic Signature(s) Signed: 11/11/2021 11:05:25 AM By: Worthy Keeler PA-C Entered By: Worthy Keeler on 11/11/2021 11:05:24 GINO, GARRABRANT ([id number]) -------------------------------------------------------------------------------- SuperBill Details Patient Name: OSLO, HUNTSMAN. Date of Service: 11/11/2021 Medical Record Number: [id number] Patient Account Number: [id number] Date of Birth/Sex: [birth year] (76 y.o. M) Treating RN: Levora Dredge Primary Care Provider: Fulton Reek Other Clinician: Referring Provider: Fulton Reek Treating Provider/Extender: Skipper Cliche in Treatment: 15 Diagnosis Coding ICD-10 Codes Code Description E11.621 Type 2 diabetes mellitus with foot ulcer L97.421 Non-pressure chronic ulcer of left heel and midfoot limited to breakdown of skin E11.42 Type 2 diabetes mellitus with diabetic polyneuropathy Facility Procedures CPT4 Code: 44818563 Description: 14970 - PARE BENIGN LES; SGL Modifier: Quantity: 1 CPT4 Code: Description: ICD-10 Diagnosis Description L97.421 Non-pressure chronic ulcer of left heel and midfoot limited to breakd Modifier: own of skin Quantity: Physician Procedures CPT4 Code: 2637858 Description: 85027 - WC PHYS PARE BENIGN LES; SGL Modifier: Quantity: 1 CPT4 Code: Description: ICD-10 Diagnosis Description L97.421 Non-pressure chronic ulcer of left heel and midfoot limited to breakdow Modifier: n of skin Quantity: Electronic Signature(s) Signed: 11/11/2021 11:05:43 AM By: Worthy Keeler PA-C Entered By: Worthy Keeler on 11/11/2021 11:05:42

## 2021-11-11 NOTE — Progress Notes (Addendum)
[name], [name] ([id number]) Visit Report for 11/11/2021 Arrival Information Details Patient Name: [name], [name]. Date of Service: 11/11/2021 9:00 AM Medical Record Number: [id number] Patient Account Number: [id number] Date of Birth/Sex: [birth year] (76 y.o. M) Treating RN: [name] Primary Care [name]: [name] Other Clinician: Referring [name]: [name] Treating [name]/Extender: [name] in Treatment: 58 Visit Information History Since Last Visit Added or deleted any medications: No Patient Arrived: [name] Any new allergies or adverse reactions: No Arrival Time: 09:12 Had a fall or experienced change in No Accompanied By: self activities of daily living that may affect Transfer Assistance: None risk of falls: Patient Identification Verified: Yes Hospitalized since last visit: No Secondary Verification Process Completed: Yes Has Dressing in Place as Prescribed: Yes Patient Has Alerts: Yes Has Footwear/Offloading in Place as Prescribed: Yes Patient Alerts: Patient on Blood Thinner Left: Surgical Shoe with Pressure Relief Coumadin Insole Diabetic Pain Present Now: No aspirin 81mg  Electronic Signature(s) Signed: 11/11/2021 4:34:33 PM By: [name] Entered By: [name] on 11/11/2021 09:18:14 [name] ([id number]) -------------------------------------------------------------------------------- Clinic Level of Care Assessment Details Patient Name: [name]. Date of Service: 11/11/2021 9:00 AM Medical Record Number: [id number] Patient Account Number: [id number] Date of Birth/Sex: [birth year] (76 y.o. M) Treating RN: [name] Primary Care [name]: [name] Other Clinician: Referring [name]: [name] Treating [name]/Extender: [name] in Treatment: 15 Clinic Level of Care Assessment Items TOOL 1 Quantity Score []  - Use when EandM and Procedure is performed on INITIAL visit  0 ASSESSMENTS - Nursing Assessment / Reassessment []  - General Physical Exam (combine w/ comprehensive assessment (listed just below) when performed on new 0 pt. evals) []  - 0 Comprehensive Assessment (HX, ROS, Risk Assessments, Wounds Hx, etc.) ASSESSMENTS - Wound and Skin Assessment / Reassessment []  - Dermatologic / Skin Assessment (not related to wound area) 0 ASSESSMENTS - Ostomy and/or Continence Assessment and Care []  - Incontinence Assessment and Management 0 []  - 0 Ostomy Care Assessment and Management (repouching, etc.) PROCESS - Coordination of Care []  - Simple Patient / Family Education for ongoing care 0 []  - 0 Complex (extensive) Patient / Family Education for ongoing care []  - 0 Staff obtains Programmer, systems, Records, Test Results / Process Orders []  - 0 Staff telephones HHA, Nursing Homes / Clarify orders / etc []  - 0 Routine Transfer to another Facility (non-emergent condition) []  - 0 Routine Hospital Admission (non-emergent condition) []  - 0 New Admissions / Biomedical engineer / Ordering NPWT, Apligraf, etc. []  - 0 Emergency Hospital Admission (emergent condition) PROCESS - Special Needs []  - Pediatric / Minor Patient Management 0 []  - 0 Isolation Patient Management []  - 0 Hearing / Language / Visual special needs []  - 0 Assessment of Community assistance (transportation, D/C planning, etc.) []  - 0 Additional assistance / Altered mentation []  - 0 Support Surface(s) Assessment (bed, cushion, seat, etc.) INTERVENTIONS - Miscellaneous []  - External ear exam 0 []  - 0 Patient Transfer (multiple staff / Civil Service fast streamer / Similar devices) []  - 0 Simple Staple / Suture removal (25 or less) []  - 0 Complex Staple / Suture removal (26 or more) []  - 0 Hypo/Hyperglycemic Management (do not check if billed separately) []  - 0 Ankle / Brachial Index (ABI) - do not check if billed separately Has the patient been seen at the hospital within the last three years:  Yes Total Score: 0 Level Of Care: ____ [name] ([id number]) Electronic Signature(s) Signed: 11/11/2021 4:34:33 PM By: [name] Entered By:  [name] on 11/11/2021 10:39:42 [name], [name] ([id number]) -------------------------------------------------------------------------------- Lower Extremity Assessment Details Patient Name: [name]. Date of Service: 11/11/2021 9:00 AM Medical Record Number: [id number] Patient Account Number: [id number] Date of Birth/Sex: [birth year] (76 y.o. M) Treating RN: [name] Primary Care [name]: [name] Other Clinician: Referring [name]: [name] Treating [name]/Extender: [name] in Treatment: 15 Edema Assessment Assessed: [Left: No] [Right: No] Edema: [Left: Ye] [Right: s] Calf Left: Right: Point of Measurement: 32 cm From Medial Instep 39.2 cm Ankle Left: Right: Point of Measurement: 10 cm From Medial Instep 23.3 cm Vascular Assessment Pulses: Dorsalis Pedis Doppler Audible: [Left:Yes] Posterior Tibial Doppler Audible: [Left:Yes] Electronic Signature(s) Signed: 11/11/2021 4:34:33 PM By: [name] Entered By: [name] on 11/11/2021 09:32:03 [name] ([id number]) -------------------------------------------------------------------------------- Multi Wound Chart Details Patient Name: [name]. Date of Service: 11/11/2021 9:00 AM Medical Record Number: [id number] Patient Account Number: [id number] Date of Birth/Sex: [birth year] (76 y.o. M) Treating RN: [name] Primary Care [name]: [name] Other Clinician: Referring [name]: [name] Treating [name]/Extender: [name] in Treatment: 15 Vital Signs Height(in): 52 Pulse(bpm): 32 Weight(lbs): 82 Blood Pressure(mmHg): 96/57 Body Mass Index(BMI): 39.4 Temperature(F): 97.8 Respiratory Rate(breaths/min): 18 Photos: [N/A:N/A] Wound Location: Left Calcaneus N/A  N/A Wounding Event: Gradually Appeared N/A N/A Primary Etiology: Diabetic Wound/Ulcer of the Lower N/A N/A Extremity Comorbid History: Cataracts, Arrhythmia, Coronary N/A N/A Artery Disease, Hypertension, Type II Diabetes, Osteoarthritis, Neuropathy Date Acquired: 07/07/2021 N/A N/A Weeks of Treatment: 15 N/A N/A Wound Status: Open N/A N/A Wound Recurrence: No N/A N/A Measurements L x W x D (cm) 0.9x1x0.1 N/A N/A Area (cm) : 0.707 N/A N/A Volume (cm) : 0.071 N/A N/A % Reduction in Area: 67.80% N/A N/A % Reduction in Volume: 67.70% N/A N/A Classification: Grade 2 N/A N/A Exudate Amount: Medium N/A N/A Exudate Type: Serosanguineous N/A N/A Exudate Color: red, brown N/A N/A Wound Margin: Thickened N/A N/A Granulation Amount: Small (1-33%) N/A N/A Granulation Quality: Pink N/A N/A Necrotic Amount: Medium (34-66%) N/A N/A Necrotic Tissue: Eschar N/A N/A Exposed Structures: Fat Layer (Subcutaneous Tissue): N/A N/A Yes Fascia: No Tendon: No Muscle: No Joint: No Bone: No Epithelialization: None N/A N/A Treatment Notes Electronic Signature(s) Signed: 11/11/2021 4:34:33 PM By: Ferman Hamming ([id number]) Entered By: [name] on 11/11/2021 10:06:09 [name] ([id number]) -------------------------------------------------------------------------------- Multi-Disciplinary Care Plan Details Patient Name: [name], [name]. Date of Service: 11/11/2021 9:00 AM Medical Record Number: [id number] Patient Account Number: [id number] Date of Birth/Sex: 10-Aug-1946 (76 y.o. M) Treating RN: [name] Primary Care Tommaso Cavitt: [name] Other Clinician: Referring Jaila Schellhorn: [name] Treating Keva Darty/Extender: [name] in Treatment: 15 Active Inactive Electronic Signature(s) Signed: 11/11/2021 11:20:17 AM By: [name] Entered By: [name] on 11/11/2021 11:20:17 [name], [name]  ([id number]) -------------------------------------------------------------------------------- Pain Assessment Details Patient Name: [name], [name]. Date of Service: 11/11/2021 9:00 AM Medical Record Number: [id number] Patient Account Number: [id number] Date of Birth/Sex: 04/18/1946 (76 y.o. M) Treating RN: [name] Primary Care Dori Devino: [name] Other Clinician: Referring Terrina Docter: [name] Treating Shaylon Gillean/Extender: [name] in Treatment: 15 Active Problems Location of Pain Severity and Description of Pain Patient Has Paino No Site Locations Rate the pain. Current Pain Level: 0 Pain Management and Medication Current Pain Management: Electronic Signature(s) Signed: 11/11/2021 4:34:33 PM By: [name] Entered By: [name] on 11/11/2021 09:19:09 [name] ([id number]) -------------------------------------------------------------------------------- Patient/Caregiver Education Details Patient Name: [name], [name]. Date of Service: 11/11/2021 9:00 AM Medical Record Number:  [id number] Patient Account Number: [id number] Date of Birth/Gender: 1946/08/22 (76 y.o. M) Treating RN: [name] Primary Care Physician: [name] Other Clinician: Referring Physician: Fulton [name] Treating Physician/Extender: [name] in Treatment: 15 Education Assessment Education Provided To: Patient Education Topics Provided Wound/Skin Impairment: Handouts: Other: caring for healed wound Methods: Explain/Verbal Responses: State content correctly Electronic Signature(s) Signed: 11/11/2021 4:34:33 PM By: [name] Entered By: [name] on 11/11/2021 10:40:15 [name] ([id number]) -------------------------------------------------------------------------------- Wound Assessment Details Patient Name: [name], [name]. Date of Service: 11/11/2021 9:00 AM Medical Record Number: [id number] Patient Account  Number: [id number] Date of Birth/Sex: Sep 14, 1946 (76 y.o. M) Treating RN: [name] Primary Care Zeidy Tayag: [name] Other Clinician: Referring Tameeka Luo: [name] Treating Eulalia Ellerman/Extender: [name] in Treatment: 15 Wound Status Wound Number: 9 Primary Diabetic Wound/Ulcer of the Lower Extremity Etiology: Wound Location: Left Calcaneus Wound Open Wounding Event: Gradually Appeared Status: Date Acquired: 07/07/2021 Comorbid Cataracts, Arrhythmia, Coronary Artery Disease, Weeks Of Treatment: 15 History: Hypertension, Type II Diabetes, Osteoarthritis, Clustered Wound: No Neuropathy Photos Wound Measurements Length: (cm) 0 Width: (cm) 0 Depth: (cm) 0 Area: (cm) 0 Volume: (cm) 0 % Reduction in Area: 100% % Reduction in Volume: 100% Epithelialization: Large (67-100%) Tunneling: No Undermining: No Wound Description Classification: Grade 2 Wound Margin: Thickened Exudate Amount: None Present Foul Odor After Cleansing: No Slough/Fibrino Yes Wound Bed Granulation Amount: None Present (0%) Exposed Structure Necrotic Amount: None Present (0%) Fascia Exposed: No Fat Layer (Subcutaneous Tissue) Exposed: Yes Tendon Exposed: No Muscle Exposed: No Joint Exposed: No Bone Exposed: No Electronic Signature(s) Signed: 11/11/2021 4:34:33 PM By: [name] Entered By: [name] on 11/11/2021 10:15:35 [name] ([id number]) -------------------------------------------------------------------------------- [place] Details Patient Name: [name]. Date of Service: 11/11/2021 9:00 AM Medical Record Number: [id number] Patient Account Number: [id number] Date of Birth/Sex: July 28, 1946 (76 y.o. M) Treating RN: [name] Primary Care Coreyon Nicotra: [name] Other Clinician: Referring Noreen Mackintosh: [name] Treating Blaire Palomino/Extender: [name] in Treatment: 15 Vital Signs Time Taken: 09:18 Temperature (F): 97.8 Height  (in): 69 Pulse (bpm): 68 Weight (lbs): 267 Respiratory Rate (breaths/min): 18 Body Mass Index (BMI): 39.4 Blood Pressure (mmHg): 96/57 Reference Range: 80 - 120 mg / dl Electronic Signature(s) Signed: 11/11/2021 4:34:33 PM By: [name] Entered By: [name] on 11/11/2021 09:18:54

## 2021-12-30 ENCOUNTER — Telehealth: Payer: Self-pay | Admitting: *Deleted

## 2021-12-30 NOTE — Telephone Encounter (Signed)
Patient called to cancel March appointments. ?

## 2022-01-13 ENCOUNTER — Other Ambulatory Visit: Payer: Medicare HMO

## 2022-01-13 ENCOUNTER — Ambulatory Visit: Payer: Medicare HMO | Admitting: Oncology

## 2022-02-01 ENCOUNTER — Emergency Department
Admission: EM | Admit: 2022-02-01 | Discharge: 2022-02-01 | Disposition: A | Discharge status: Home / Self Care | Payer: Medicare HMO | Attending: Emergency Medicine | Admitting: Emergency Medicine

## 2022-02-01 ENCOUNTER — Emergency Department: Payer: Medicare HMO

## 2022-02-01 ENCOUNTER — Other Ambulatory Visit: Payer: Self-pay

## 2022-02-01 DIAGNOSIS — I10 Essential (primary) hypertension: Secondary | ICD-10-CM | POA: Diagnosis not present

## 2022-02-01 DIAGNOSIS — E1142 Type 2 diabetes mellitus with diabetic polyneuropathy: Secondary | ICD-10-CM | POA: Insufficient documentation

## 2022-02-01 DIAGNOSIS — S61112A Laceration without foreign body of left thumb with damage to nail, initial encounter: Secondary | ICD-10-CM | POA: Insufficient documentation

## 2022-02-01 DIAGNOSIS — S6992XA Unspecified injury of left wrist, hand and finger(s), initial encounter: Secondary | ICD-10-CM | POA: Diagnosis present

## 2022-02-01 DIAGNOSIS — W270XXA Contact with workbench tool, initial encounter: Secondary | ICD-10-CM | POA: Diagnosis not present

## 2022-02-01 DIAGNOSIS — S68119A Complete traumatic metacarpophalangeal amputation of unspecified finger, initial encounter: Secondary | ICD-10-CM

## 2022-02-01 MED ORDER — SULFAMETHOXAZOLE-TRIMETHOPRIM 800-160 MG PO TABS
1.0000 | ORAL_TABLET | Freq: Two times a day (BID) | ORAL | 0 refills | Status: AC
Start: 2022-02-01 — End: 2022-02-11

## 2022-02-01 MED ORDER — BUPIVACAINE HCL (PF) 0.5 % IJ SOLN
10.0000 mL | Freq: Once | INTRAMUSCULAR | Status: AC
  Administered 2022-02-01: 10 mL
  Filled 2022-02-01: qty 10

## 2022-02-01 MED ORDER — LIDOCAINE HCL (PF) 1 % IJ SOLN
5.0000 mL | Freq: Once | INTRAMUSCULAR | Status: AC
Start: 2022-02-01 — End: 2022-02-01
  Administered 2022-02-01: 5 mL
  Filled 2022-02-01: qty 5

## 2022-02-01 MED ORDER — OXYCODONE HCL 5 MG PO TABS: 5.0000 mg | ORAL_TABLET | Freq: Three times a day (TID) | ORAL | 0 refills | Status: AC | PRN

## 2022-02-01 MED ORDER — HYDROCODONE-ACETAMINOPHEN 5-325 MG PO TABS: 1.0000 | ORAL_TABLET | Freq: Three times a day (TID) | ORAL | 0 refills | Status: DC | PRN

## 2022-02-01 MED ORDER — SULFAMETHOXAZOLE-TRIMETHOPRIM 800-160 MG PO TABS
1.0000 | ORAL_TABLET | Freq: Once | ORAL | Status: AC
  Administered 2022-02-01: 1 via ORAL
  Filled 2022-02-01: qty 1

## 2022-02-01 NOTE — ED Notes (Signed)
Lac cart at bedside  ?

## 2022-02-01 NOTE — Discharge Instructions (Addendum)
Keep the wound clean, dry, and covered.  Follow-up with Ortho for interim wound check.  Take antibiotics as prescribed and the pain medicine as needed.  See your provider for suture removal in 14 days. ?

## 2022-02-01 NOTE — ED Triage Notes (Signed)
Pt cut off tip of left thumb on miter saw this morning. Pt with gauze and pressure on wound, bloody drainage on dressing. Pt states he cut of tip about halfway down and it is still connected by some skin.  ?

## 2022-02-01 NOTE — ED Provider Notes (Signed)
? ? ?[facility] ?Emergency Department Provider Note ? ? ? ? Event Date/Time  ? First MD Initiated Contact with Patient 02/01/22 1246   ?  (approximate) ? ? ?History  ? ?Laceration ? ? ?HPI ? ?[name] is a 76 y.o. male with a history that includes hypertension, diabetes, and peripheral neuropathy, presents to the ED with partial application of the tip of his left thumb.  Patient was using a miter saw this morning, he accidentally cut the tip of his thumb.  He presents with a pressure dressing in place and notes that his fingertip is only held in place by some skin.  Patient reports a current tetanus status.  Denies any injury at this time. ?  ? ? ?Physical Exam  ? ?Triage Vital Signs: ?ED Triage Vitals  ?Enc Vitals Group  ?   BP 02/01/22 1230 127/63  ?   Pulse Rate 02/01/22 1228 62  ?   Resp 02/01/22 1228 18  ?   Temp 02/01/22 1228 97.9 ?F (36.6 ?C)  ?   Temp Source 02/01/22 1228 Oral  ?   SpO2 02/01/22 1228 100 %  ?   Weight 02/01/22 1225 268 lb (121.6 kg)  ?   Height 02/01/22 1225 5\' 10"  (1.778 m)  ?   Head Circumference --   ?   Peak Flow --   ?   Pain Score 02/01/22 1225 8  ?   Pain Loc --   ?   Pain Edu? --   ?   Excl. in [place]? --   ? ? ?Most recent vital signs: ?Vitals:  ? 02/01/22 1228 02/01/22 1230  ?BP:  127/63  ?Pulse: 62   ?Resp: 18   ?Temp: 97.9 ?F (36.6 ?C)   ?SpO2: 100%   ? ? ?General Awake, no distress.  ?CV:  Good peripheral perfusion.  ?RESP:  Normal effort.  ?ABD:  No distention.  ?MSK:  Composite fist on the left.  Patient with a large near complete distal fingertip of avulsion on the left thumb.  The distal flap is anchored by the ulnar aspect of the dermal skin.  The distal portion of the nail is intact.  Bleeding is controlled with a bulky dressing. ? ? ?ED Results / Procedures / Treatments  ? ?Labs ?(all labs ordered are listed, but only abnormal results are displayed) ?Labs Reviewed - No data to display ? ? ?EKG ? ?RADIOLOGY ? ?I personally viewed and evaluated  these images as part of my medical decision making, as well as reviewing the written report by the radiologist. ? ?ED Provider Interpretation: Distal tuft near complete amputation} ? ?DG Finger Thumb Left ? ?Result Date: 02/01/2022 ?CLINICAL DATA:  Miter saw injury, amputation distal first digit EXAM: LEFT THUMB 2+V COMPARISON:  02/01/2022 FINDINGS: Frontal, oblique, lateral views of the left first digit are obtained. Open fracture of the distal tuft of the first distal phalanx is again noted. Interval repair of the laceration to the soft tissue of the distal tuft. Overlying bandage material. No radiopaque foreign body. IMPRESSION: 1. Status post repair of the laceration of the distal tuft of the first distal phalanx, with near anatomic alignment of the open first distal phalangeal tuft fracture. Electronically Signed   By: Randa Ngo M.D.   On: 02/01/2022 15:13  ? ?DG Finger Thumb Left ? ?Result Date: 02/01/2022 ?CLINICAL DATA:  Amputation injury EXAM: LEFT THUMB 2+V COMPARISON:  None. FINDINGS: There is significant laceration/partial amputation injury at the distal  aspect of the thumb. Associated open comminuted fracture of the distal tuft with up to 4 mm volar displacement of the largest fracture fragment. No radiopaque foreign body identified. IMPRESSION: Partial amputation injury and open comminuted fracture as described. Electronically Signed   By: Ofilia Neas M.D.   On: 02/01/2022 13:11   ? ? ?PROCEDURES: ? ?Critical Care performed: No ? ?Marland Kitchen.Laceration Repair ? ?Date/Time: 02/01/2022 2:31 PM ?Performed by: Melvenia Needles, PA-C ?Authorized by: Melvenia Needles, PA-C  ? ?Consent:  ?  Consent obtained:  Verbal ?  Consent given by:  Patient ?  Risks, benefits, and alternatives were discussed: yes   ?  Risks discussed:  Pain, infection and nerve damage ?  Alternatives discussed:  Referral ?Universal protocol:  ?  Test results available: yes   ?  Site/side marked: yes   ?  Immediately prior to  procedure, a time out was called: yes   ?  Patient identity confirmed:  Verbally with patient ?Anesthesia:  ?  Anesthesia method:  Nerve block ?  Block location:  Flexor tendon sheath ?  Block needle gauge:  27 G ?  Block anesthetic:  Lidocaine 1% w/o epi and bupivacaine 0.5% w/o epi ?  Block technique:  Transthecal/digital block ?  Block injection procedure:  Anatomic landmarks palpated, incremental injection and introduced needle ?  Block outcome:  Anesthesia achieved ?Laceration details:  ?  Location:  Finger ?  Finger location:  L thumb ?  Length (cm):  2 ?  Depth (mm):  10 ?Pre-procedure details:  ?  Preparation:  Patient was prepped and draped in usual sterile fashion ?Exploration:  ?  Limited defect created (wound extended): yes   ?  Hemostasis achieved with:  Tourniquet ?  Wound exploration: wound explored through full range of motion   ?  Wound extent: underlying fracture   ?  Wound extent comment:  Rongeur used to trim distal phalanx ?  Contaminated: no   ?Treatment:  ?  Area cleansed with:  Saline and povidone-iodine ?  Amount of cleaning:  Extensive ?  Irrigation solution:  Sterile saline ?  Irrigation volume:  20 ?  Irrigation method:  Syringe ?  Debridement:  None ?  Undermining:  Minimal ?  Scar revision: no   ?Skin repair:  ?  Repair method:  Sutures ?  Suture size:  4-0 ?  Suture material:  Nylon ?  Suture technique:  Simple interrupted ?  Number of sutures:  4 ?Approximation:  ?  Approximation:  Close ?Repair type:  ?  Repair type:  Complex ?Post-procedure details:  ?  Dressing:  Bulky dressing and non-adherent dressing ?  Procedure completion:  Tolerated well, no immediate complications ?Marland KitchenNail Removal ? ?Date/Time: 02/01/2022 2:34 PM ?Performed by: Melvenia Needles, PA-C ?Authorized by: Melvenia Needles, PA-C  ? ?Consent:  ?  Consent obtained:  Verbal ?  Consent given by:  Patient ?  Risks, benefits, and alternatives were discussed: yes   ?  Risks discussed:  Infection and  pain ?Universal protocol:  ?  Imaging studies available: yes   ?  Patient identity confirmed:  Verbally with patient ?Location:  ?  Hand:  L thumb ?Pre-procedure details:  ?  Skin preparation:  Povidone-iodine ?  Preparation: Patient was prepped and draped in the usual sterile fashion   ?Anesthesia:  ?  Anesthesia method:  Nerve block ?  Block needle gauge:  27 G ?  Block anesthetic:  Lidocaine 1%  w/o epi and bupivacaine 0.5% w/o epi ?  Block technique:  Transthecal block ?  Block injection procedure:  Anatomic landmarks palpated and introduced needle ?  Block outcome:  Anesthesia achieved ?Nail Removal:  ?  Nail removed:  Complete ?  Nail bed repaired: yes   ?  Nail bed repair material:  5-0 vicryl ?  Number of sutures:  4 ?  Removed nail replaced and anchored: no   ?Trephination:  ?  Subungual hematoma drained: no   ?Ingrown nail:  ?  Wedge excision of skin: no   ?  Nail matrix removed or ablated:  None ?Post-procedure details:  ?  Dressing:  Petrolatum-impregnated gauze, non-adhesive packing strip and gauze roll ?  Procedure completion:  Tolerated well, no immediate complications ? ? ?MEDICATIONS ORDERED IN ED: ?Medications  ?lidocaine (PF) (XYLOCAINE) 1 % injection 5 mL (5 mLs Infiltration Given 02/01/22 1340)  ?sulfamethoxazole-trimethoprim (BACTRIM DS) 800-160 MG per tablet 1 tablet (1 tablet Oral Given 02/01/22 1340)  ?bupivacaine(PF) (MARCAINE) 0.5 % injection 10 mL (10 mLs Infiltration Given 02/01/22 1342)  ? ? ? ?IMPRESSION / MDM / ASSESSMENT AND PLAN / ED COURSE  ?I reviewed the triage vital signs and the nursing notes. ?             ?               ? ?Differential diagnosis includes, but is not limited to, nail avulsion, crush injury, partial amputation, open fracture, distal tuft fracture ? ?Patient to the ED for evaluation of accidental laceration to the distal left thumb.  Patient presents with a near complete amputation of the fingertip.  Patient consents to wound repair which includes taking down the  distal portion of the proximal tuft.  Patient's large wound is repaired using a partial avulsed tip.  Good wound edge approximation is achieved.  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

## 2022-03-05 ENCOUNTER — Telehealth: Payer: Self-pay | Admitting: Neurology

## 2022-03-05 ENCOUNTER — Encounter: Payer: Self-pay | Admitting: Neurology

## 2022-03-05 ENCOUNTER — Ambulatory Visit: Payer: Medicare HMO | Admitting: Neurology

## 2022-03-05 VITALS — BP 156/80 | HR 65 | Ht 70.0 in | Wt 268.0 lb

## 2022-03-05 DIAGNOSIS — R269 Unspecified abnormalities of gait and mobility: Secondary | ICD-10-CM | POA: Diagnosis not present

## 2022-03-05 DIAGNOSIS — E1149 Type 2 diabetes mellitus with other diabetic neurological complication: Secondary | ICD-10-CM | POA: Diagnosis not present

## 2022-03-05 DIAGNOSIS — I951 Orthostatic hypotension: Secondary | ICD-10-CM | POA: Diagnosis not present

## 2022-03-05 DIAGNOSIS — R42 Dizziness and giddiness: Secondary | ICD-10-CM

## 2022-03-05 MED ORDER — OXCARBAZEPINE 150 MG PO TABS: 150.0000 mg | ORAL_TABLET | Freq: Two times a day (BID) | ORAL | 0 refills | Status: DC

## 2022-03-05 NOTE — Progress Notes (Signed)
[facility]  PATIENT: [name] DOB: Sep 28, 1946  REQUESTING CLINICIAN: Idelle Crouch, MD HISTORY FROM: Patient and spouse  REASON FOR VISIT: Dizziness, neuropathy    HISTORICAL  CHIEF COMPLAINT:  Chief Complaint  Patient presents with   New Patient (Initial Visit)    Rm 12. Accompanied by wife, Joycelyn Schmid. NP/Paper/Jeffrey Sparks MD [facility]/Dizziness.    HISTORY OF PRESENT ILLNESS:  This is a 76 year old gentleman with past medical history including atrial fibrillation, peripheral neuropathy, history of stroke, hypertension, hyperlipidemia, and diabetes mellitus type 2 who is presenting with complaint of dizziness.  Patient reports 4 years ago he was admitted, diagnosed with a stroke and since then has been having dizziness.  He described the dizziness as feeling lightheaded upon standing and starting to walk.  He denies any room spinning sensation and denies having the dizziness while sitting down or laying down.  It only occurs upon standing and walking.  He feels like he is going to fall.  Currently he uses a cane.  He has not completed physical therapy lately.   On top of that, he is complaining of peripheral neuropathy, diagnosed for more than 15 years.  He describes the pain as painful burning sensation, difficulty walking, cannot feel the ground.  He did have work-up in the past including EMG, nerve conduction conduction study and ultrasound of the leg  Diagnosis was neuropathy and he had tried multiple medication including TCAs, antidepressant, antiseizure medication including Neurontin and pregabalin.  Currently he is on Neurontin 600 mg twice daily and duloxetine 60 mg in the evening.    OTHER MEDICAL CONDITIONS: Atrial fibrillation, peripheral neuropathy, diabetes mellitus type 2, hypertension, hyperlipidemia, CAD, history of CVA   REVIEW OF SYSTEMS: Full 14 system review of systems performed and negative with exception of: as noted in  the HPI  ALLERGIES: Allergies  Allergen Reactions   Tetracycline Hives   Tetracyclines & Related Hives    HOME MEDICATIONS: Outpatient Medications Prior to Visit  Medication Sig Dispense Refill   amiodarone (PACERONE) 200 MG tablet Take 1 tablet (200 mg total) by mouth 2 (two) times daily. 60 tablet 0   aspirin EC 81 MG tablet Take 81 mg by mouth daily.     cyanocobalamin (,VITAMIN B-12,) 1000 MCG/ML injection Inject 1,000 mcg into the muscle every 30 (thirty) days.     cyclobenzaprine (FLEXERIL) 10 MG tablet Take 10 mg by mouth daily as needed for muscle spasms.     DULoxetine (CYMBALTA) 60 MG capsule Take 30 mg by mouth every evening.     ferrous sulfate 325 (65 FE) MG tablet Take 1 tablet (325 mg total) by mouth 2 (two) times daily with a meal. 60 tablet 3   gabapentin (NEURONTIN) 600 MG tablet Take 600 mg by mouth 2 (two) times daily.     loratadine (CLARITIN) 10 MG tablet Take 10 mg by mouth daily.     metFORMIN (GLUCOPHAGE) 1000 MG tablet Take 1,000 mg by mouth 2 (two) times daily. With meals.     methadone (DOLOPHINE) 5 MG tablet Take 5 mg by mouth 2 (two) times daily.      methimazole (TAPAZOLE) 10 MG tablet Take 20 mg by mouth daily.     metoprolol tartrate (LOPRESSOR) 25 MG tablet Take 25 mg by mouth 2 (two) times daily.      Multiple Vitamin (MULTI-VITAMINS) TABS Take 1 tablet by mouth daily.     pantoprazole (PROTONIX) 40 MG tablet Take 40 mg by mouth  daily.     pravastatin (PRAVACHOL) 40 MG tablet Take 40 mg by mouth every evening.      torsemide (DEMADEX) 20 MG tablet Take by mouth daily.     warfarin (COUMADIN) 2.5 MG tablet Take 2.5 mg by mouth. Take on Monday and Friday only     warfarin (COUMADIN) 5 MG tablet Take 5 mg by mouth daily. Take on Tuesday, Wednesday, Thursday, Saturday, and Sunday     glipiZIDE (GLUCOTROL XL) 5 MG 24 hr tablet Take 5 mg by mouth daily.     KLOR-CON M20 20 MEQ tablet Take by mouth.     No facility-administered medications prior to visit.     PAST MEDICAL HISTORY: Past Medical History:  Diagnosis Date   Anemia    CHF (congestive heart failure) (HCC)    Chronic pain    Coronary artery disease    DDD (degenerative disc disease), hip    Diabetes mellitus without complication (HCC)    DJD (degenerative joint disease)    Dysrhythmia    GERD (gastroesophageal reflux disease)    History of hip replacement    Hyperlipemia    Hypertension    Insomnia    Neuropathy    Paroxysmal A-fib ([place])    Peripheral neuropathy    Stroke [facility])    Thoracic aortic atherosclerosis ([place])     PAST SURGICAL HISTORY: Past Surgical History:  Procedure Laterality Date   BACK SURGERY     CARDIAC SURGERY     CARDIAC VALVE REPLACEMENT     CARDIOVERSION N/A 05/04/2017   Procedure: CARDIOVERSION;  Surgeon: Corey Skains, MD;  Location: [facility] ORS;  Service: Cardiovascular;  Laterality: N/A;   CARDIOVERSION N/A 09/04/2021   Procedure: CARDIOVERSION;  Surgeon: Corey Skains, MD;  Location: [facility] ORS;  Service: Cardiovascular;  Laterality: N/A;   carpel tunnel release     CATARACT EXTRACTION W/ INTRAOCULAR LENS IMPLANT Bilateral    COLONOSCOPY     COLONOSCOPY WITH ESOPHAGOGASTRODUODENOSCOPY (EGD)     COLONOSCOPY WITH PROPOFOL N/A 05/16/2018   Procedure: COLONOSCOPY WITH PROPOFOL;  Surgeon: Manya Silvas, MD;  Location: [facility] ENDOSCOPY;  Service: Endoscopy;  Laterality: N/A;   COLONOSCOPY WITH PROPOFOL N/A 08/06/2021   Procedure: COLONOSCOPY WITH PROPOFOL;  Surgeon: Toledo, Benay Pike, MD;  Location: [facility] ENDOSCOPY;  Service: Gastroenterology;  Laterality: N/A;   ESOPHAGOGASTRODUODENOSCOPY (EGD) WITH PROPOFOL N/A 05/16/2018   Procedure: ESOPHAGOGASTRODUODENOSCOPY (EGD) WITH PROPOFOL;  Surgeon: Manya Silvas, MD;  Location: [facility] ENDOSCOPY;  Service: Endoscopy;  Laterality: N/A;   JOINT REPLACEMENT     TEE WITHOUT CARDIOVERSION N/A 10/08/2016   Procedure: TRANSESOPHAGEAL ECHOCARDIOGRAM (TEE);  Surgeon: Teodoro Spray, MD;  Location: [facility]  ORS;  Service: Cardiovascular;  Laterality: N/A;   TEE WITHOUT CARDIOVERSION N/A 11/23/2016   Procedure: TRANSESOPHAGEAL ECHOCARDIOGRAM (TEE);  Surgeon: Isaias Cowman, MD;  Location: [facility] ORS;  Service: Cardiovascular;  Laterality: N/A;   TONSILLECTOMY     VSD REPAIR      FAMILY HISTORY: Family History  Problem Relation Age of Onset   Stroke Mother    Heart attack Mother    Heart attack Father     SOCIAL HISTORY: Social History   Socioeconomic History   Marital status: Married    Spouse name: Not on file   Number of children: Not on file   Years of education: Not on file   Highest education level: Not on file  Occupational History   Not on file  Tobacco Use   Smoking status: Former  Types: Cigarettes    Quit date: 07/09/1996    Years since quitting: 25.6   Smokeless tobacco: Never  Vaping Use   Vaping Use: Never used  Substance and Sexual Activity   Alcohol use: Not Currently    Comment: occasional   Drug use: No   Sexual activity: Yes  Other Topics Concern   Not on file  Social History Narrative   Not on file   Social Determinants of Health   Financial Resource Strain: Not on file  Food Insecurity: Not on file  Transportation Needs: Not on file  Physical Activity: Not on file  Stress: Not on file  Social Connections: Not on file  Intimate Partner Violence: Not on file    PHYSICAL EXAM  GENERAL EXAM/CONSTITUTIONAL: Vitals:  Vitals:   03/05/22 0951 03/05/22 0953 03/05/22 0956  BP: (!) 172/63 137/71 (!) 156/80  Pulse: 62 (!) 59 65  Weight: 268 lb (121.6 kg)    Height: 5\' 10"  (1.778 m)     Body mass index is 38.45 kg/m. Wt Readings from Last 3 Encounters:  03/05/22 268 lb (121.6 kg)  02/01/22 268 lb (121.6 kg)  09/17/21 268 lb (121.6 kg)   Patient is in no distress; well developed, nourished and groomed; neck is supple  EYES: Pupils round and reactive to light, Visual fields full to confrontation, Extraocular movements intacts,    MUSCULOSKELETAL: Gait, strength, tone, movements noted in Neurologic exam below  NEUROLOGIC: MENTAL STATUS:      View : No data to display.         awake, alert, oriented to person, place and time recent and remote memory intact normal attention and concentration language fluent, comprehension intact, naming intact fund of knowledge appropriate  CRANIAL NERVE:  2nd, 3rd, 4th, 6th - pupils equal and reactive to light, visual fields full to confrontation, extraocular muscles intact, no nystagmus 5th - facial sensation symmetric 7th - facial strength symmetric 8th - hearing intact 9th - palate elevates symmetrically, uvula midline 11th - shoulder shrug symmetric 12th - tongue protrusion midline  MOTOR:  normal bulk and tone, full strength in the BUE, BLE  SENSORY:  Decrease sensation to light touch, pinprick and vibration in the bilateral lower extremities up to knees and affection tips of fingers. There is absent proprioception. Absent hair in the BLEs.   COORDINATION:  finger-nose-finger, fine finger movements normal  REFLEXES:  deep tendon reflexes present and symmetric  GAIT/STATION:  Slightly unsteady, unable to tandem     DIAGNOSTIC DATA (LABS, IMAGING, TESTING) - I reviewed patient records, labs, notes, testing and imaging myself where available.  Lab Results  Component Value Date   WBC 8.3 09/08/2021   HGB 10.3 (L) 09/08/2021   HCT 33.9 (L) 09/08/2021   MCV 94.4 09/08/2021   PLT 297 09/08/2021      Component Value Date/Time   NA 137 08/21/2021 0505   NA 135 (L) 08/10/2013 0554   K 3.5 08/21/2021 0505   K 3.6 08/10/2013 0554   CL 104 08/21/2021 0505   CL 104 08/10/2013 0554   CO2 27 08/21/2021 0505   CO2 24 08/10/2013 0554   GLUCOSE 153 (H) 08/21/2021 0505   GLUCOSE 143 (H) 08/10/2013 0554   BUN 19 08/21/2021 0505   BUN 12 08/10/2013 0554   CREATININE 0.85 08/21/2021 0505   CREATININE 0.98 08/10/2013 0554   CALCIUM 7.8 (L) 08/21/2021 0505    CALCIUM 8.4 (L) 08/10/2013 0554   PROT 6.5 08/18/2021 0649   ALBUMIN  2.9 (L) 08/18/2021 0649   AST 18 08/18/2021 0649   ALT 15 08/18/2021 0649   ALKPHOS 71 08/18/2021 0649   BILITOT 0.3 08/18/2021 0649   GFRNONAA >60 08/21/2021 0505   GFRNONAA >60 08/10/2013 0554   GFRAA >60 12/28/2018 1229   GFRAA >60 08/10/2013 0554   Lab Results  Component Value Date   CHOL 163 12/17/2016   HDL 48 12/17/2016   LDLCALC 85 12/17/2016   TRIG 152 (H) 12/17/2016   CHOLHDL 3.4 12/17/2016   Lab Results  Component Value Date   HGBA1C 7.3 (H) 08/17/2021   Lab Results  Component Value Date   VITAMINB12 205 08/18/2021   Lab Results  Component Value Date   TSH 3.560 08/18/2021    MRI Brain 07/07/20 Normal aging brain.     ASSESSMENT AND PLAN  76 y.o. year old male with atrial fibrillation, peripheral neuropathy, history of stroke, hypertension, hyperlipidemia, and diabetes mellitus type 2 who is presenting with complaint of dizziness.  His orthostatic vitals reveal a blood pressure of 172/63 upon sitting and 156/80 on standing.  Even though he does not meet the criteria for orthostatic hypotension (drop of 16, instead of 20) I do believe based on patient's symptoms that this the dizziness is likely related to a combination of orthostatic hypotension and peripheral neuropathy.  I will start by getting neuropathy labs including B12, HgA1c and serum electrophoresis.  I will refer the patient for physical therapy for gait training.  I did advise him due to his extended neuropathy and fall risk to use a walker, he is reluctant but agrees to use a walker.  Since he is already on duloxetine and Neurontin for his peripheral neuropathy, I will add Trileptal.  Advised patient to contact me in 30 days and update me on the status of the pain. I will see him in 3 months for follow-up.    1. Other diabetic neurological complication associated with type 2 diabetes mellitus (HCC)   2. Gait abnormality   3.  Lightheadedness   4. Orthostatic hypotension      Patient Instructions  Continue current medications We will obtain neuropathy lab including hemoglobin A1c, vitamin B12 level and serum electrophoresis Start Trileptal 150 mg twice daily, contact me in 1 month to update me on the status of the pain Advised to use a walker due to fall risk Referral to physical therapy Follow-up with your primary care doctor for referral to vascular surgeon for lower extremity.  You do have lack of hair, some erythema and edema and edema. Follow-up in 3 months   Orders Placed This Encounter  Procedures   Ambulatory referral to Physical Therapy    Meds ordered this encounter  Medications   OXcarbazepine (TRILEPTAL) 150 MG tablet    Sig: Take 1 tablet (150 mg total) by mouth 2 (two) times daily.    Dispense:  60 tablet    Refill:  0    Return in about 3 months (around 06/05/2022).  I have spent a total of  65 minutes dedicated to this patient today, preparing to see patient, performing a medically appropriate examination and evaluation, ordering tests and/or medications and procedures, and counseling and educating the patient/family/caregiver; independently interpreting result and communicating results to the family/patient/caregiver; and documenting clinical information in the electronic medical record.    Alric Ran, MD 03/05/2022, 1:25 PM  [facility] [address] [place], [place] [address] [phone]

## 2022-03-05 NOTE — Telephone Encounter (Signed)
Referral for Physical Therapy sent to [facility] [phone].

## 2022-03-05 NOTE — Patient Instructions (Addendum)
Continue current medications We will obtain neuropathy lab including hemoglobin A1c, vitamin B12 level and serum electrophoresis Start Trileptal 150 mg twice daily, contact me in 1 month to update me on the status of the pain Advised to use a walker due to fall risk Referral to physical therapy Follow-up with your primary care doctor for referral to vascular surgeon for lower extremity.  You do have lack of hair, some erythema and edema and edema. Follow-up in 3 months

## 2022-03-27 ENCOUNTER — Encounter: Payer: Self-pay | Admitting: Neurology

## 2022-03-28 ENCOUNTER — Other Ambulatory Visit: Payer: Self-pay | Admitting: Neurology

## 2022-03-30 ENCOUNTER — Telehealth: Payer: Self-pay

## 2022-03-30 NOTE — Telephone Encounter (Signed)
I called the patient. He scheduled a follow up on 04/13/22.

## 2022-03-30 NOTE — Telephone Encounter (Signed)
Patient called office and left a voicemail this morning requesting an appointment with Dr. April Manson.  Please call patient to schedule this appointment.

## 2022-04-13 ENCOUNTER — Other Ambulatory Visit: Payer: Self-pay | Admitting: Neurology

## 2022-04-13 ENCOUNTER — Encounter: Payer: Self-pay | Admitting: Neurology

## 2022-04-13 ENCOUNTER — Ambulatory Visit: Payer: Medicare HMO | Admitting: Neurology

## 2022-04-13 VITALS — BP 174/73 | HR 80 | Ht 70.0 in | Wt 266.0 lb

## 2022-04-13 DIAGNOSIS — R269 Unspecified abnormalities of gait and mobility: Secondary | ICD-10-CM | POA: Diagnosis not present

## 2022-04-13 DIAGNOSIS — G629 Polyneuropathy, unspecified: Secondary | ICD-10-CM | POA: Diagnosis not present

## 2022-04-13 MED ORDER — PREGABALIN 150 MG PO CAPS: 150.0000 mg | ORAL_CAPSULE | Freq: Two times a day (BID) | ORAL | 6 refills | Status: DC

## 2022-04-13 MED ORDER — PREGABALIN 150 MG PO CAPS: 150.0000 mg | ORAL_CAPSULE | Freq: Two times a day (BID) | ORAL | 5 refills | Status: DC

## 2022-04-13 NOTE — Telephone Encounter (Signed)
[facility] [phone]  reports that too many refills were called in for pt's pregabalin (LYRICA) 150 MG capsule. A new Rx needs to be called in, only 5 refills are allowed in 6 months

## 2022-05-20 ENCOUNTER — Telehealth: Payer: Self-pay | Admitting: *Deleted

## 2022-05-20 NOTE — Telephone Encounter (Signed)
PA approved through 10/18/2022.

## 2022-05-20 NOTE — Telephone Encounter (Signed)
PA for pregabalin questionnaire was faxed to our office on Saturday, 05/16/22. It had a deadline set for Sunday, 05/17/22. Since our office was closed, the case was denied.  I called [facility] at [phone] and submitted a peer review which will be sent to the appeals department. The patient meets requirement for the medication so it should be overturned. The decision can take up to 72 business hours.   Pt ID# [id number] Case VT#[id number]

## 2022-06-11 ENCOUNTER — Ambulatory Visit: Payer: Medicare HMO | Admitting: Neurology

## 2022-06-19 ENCOUNTER — Encounter: Payer: Medicare HMO | Attending: Physician Assistant | Admitting: Physician Assistant

## 2022-06-19 DIAGNOSIS — Z7901 Long term (current) use of anticoagulants: Secondary | ICD-10-CM | POA: Insufficient documentation

## 2022-06-19 DIAGNOSIS — W010XXA Fall on same level from slipping, tripping and stumbling without subsequent striking against object, initial encounter: Secondary | ICD-10-CM | POA: Insufficient documentation

## 2022-06-19 DIAGNOSIS — L97522 Non-pressure chronic ulcer of other part of left foot with fat layer exposed: Secondary | ICD-10-CM | POA: Insufficient documentation

## 2022-06-19 DIAGNOSIS — E11621 Type 2 diabetes mellitus with foot ulcer: Secondary | ICD-10-CM | POA: Diagnosis present

## 2022-06-19 DIAGNOSIS — G8929 Other chronic pain: Secondary | ICD-10-CM | POA: Diagnosis not present

## 2022-06-19 DIAGNOSIS — E1142 Type 2 diabetes mellitus with diabetic polyneuropathy: Secondary | ICD-10-CM | POA: Insufficient documentation

## 2022-06-19 DIAGNOSIS — I10 Essential (primary) hypertension: Secondary | ICD-10-CM | POA: Insufficient documentation

## 2022-06-19 DIAGNOSIS — I480 Paroxysmal atrial fibrillation: Secondary | ICD-10-CM | POA: Diagnosis not present

## 2022-06-19 DIAGNOSIS — I2510 Atherosclerotic heart disease of native coronary artery without angina pectoris: Secondary | ICD-10-CM | POA: Diagnosis not present

## 2022-06-19 NOTE — Progress Notes (Signed)
[name], [name] ([id number]) Visit Report for 06/19/2022 Abuse Risk Screen Details Patient Name: [name], [name]. Date of Service: 06/19/2022 8:45 AM Medical Record Number: [id number] Patient Account Number: [id number] Date of Birth/Sex: 01/02/1946 (76 y.o. M) Treating RN: [name] Primary Care Kathren Scearce: Fulton Reek Other Clinician: Referring Darrow Barreiro: Self, Referral Treating Francenia Chimenti/Extender: Skipper Cliche in Treatment: 0 Abuse Risk Screen Items Answer ABUSE RISK SCREEN: Has anyone close to you tried to hurt or harm you recentlyo No Do you feel uncomfortable with anyone in your familyo No Has anyone forced you do things that you didnot want to doo No Electronic Signature(s) Signed: 06/19/2022 12:54:01 PM By: [name], BSN, RN, CWS, [name] RN, BSN Entered By: [name], BSN, RN, CWS, [name] on 06/19/2022 08:50:28 [name] ([id number]) -------------------------------------------------------------------------------- Activities of Daily Living Details Patient Name: [name]. Date of Service: 06/19/2022 8:45 AM Medical Record Number: [id number] Patient Account Number: [id number] Date of Birth/Sex: Jul 06, 1946 (76 y.o. M) Treating RN: [name] Primary Care Achaia Garlock: Fulton Reek Other Clinician: Referring Mychaela Lennartz: Self, Referral Treating Maleke Feria/Extender: Skipper Cliche in Treatment: 0 Activities of Daily Living Items Answer Activities of Daily Living (Please select one for each item) Drive Automobile Completely Able Take Medications Completely Able Use Telephone Completely Able Care for Appearance Completely Able Use Toilet Completely Able Bath / Shower Completely Able Dress Self Completely Able Feed Self Completely Able Walk Completely Able Get In / Out Bed Completely Able Housework Completely Able Prepare Meals Completely Able Handle Money Completely Able Shop for Self Completely Able Electronic Signature(s) Signed: 06/19/2022 12:54:01 PM By: [name],  BSN, RN, CWS, [name] RN, BSN Entered By: [name], BSN, RN, CWS, [name] on 06/19/2022 08:50:39 [name] ([id number]) -------------------------------------------------------------------------------- Education Screening Details Patient Name: [name]. Date of Service: 06/19/2022 8:45 AM Medical Record Number: [id number] Patient Account Number: [id number] Date of Birth/Sex: 01-06-1946 (76 y.o. M) Treating RN: [name] Primary Care [name] Sambrano: Fulton Reek Other Clinician: Referring Doylene Splinter: Self, Referral Treating Tyniesha Howald/Extender: Skipper Cliche in Treatment: 0 Learning Preferences/Education Level/Primary Language Preferred Language: English Cognitive Barrier Language Barrier: No Translator Needed: No Memory Deficit: No Emotional Barrier: No Cultural/Religious Beliefs Affecting Medical Care: No Physical Barrier Impaired Vision: No Impaired Hearing: No Decreased Hand dexterity: No Knowledge/Comprehension Knowledge Level: High Comprehension Level: High Ability to understand written instructions: High Ability to understand verbal instructions: High Motivation Anxiety Level: Calm Cooperation: Cooperative Education Importance: Acknowledges Need Interest in Health Problems: Asks Questions Perception: Coherent Willingness to Engage in Self-Management High Activities: Readiness to Engage in Self-Management High Activities: Engineer, maintenance) Signed: 06/19/2022 12:54:01 PM By: [name], BSN, RN, CWS, [name] RN, BSN Entered By: [name], BSN, RN, CWS, [name] on 06/19/2022 08:51:00 KASIR, HALLENBECK ([id number]) -------------------------------------------------------------------------------- Fall Risk Assessment Details Patient Name: [name]. Date of Service: 06/19/2022 8:45 AM Medical Record Number: [id number] Patient Account Number: [id number] Date of Birth/Sex: 1945-12-08 (76 y.o. M) Treating RN: [name] Primary Care Draden Cottingham: Fulton Reek Other  Clinician: Referring Austyn Perriello: Self, Referral Treating Quintarius Ferns/Extender: Skipper Cliche in Treatment: 0 Fall Risk Assessment Items Have you had 2 or more falls in the last 12 monthso 0 Yes Have you had any fall that resulted in injury in the last 12 monthso 0 Yes FALLS RISK SCREEN History of falling - immediate or within 3 months 0 No Secondary diagnosis (Do you have 2 or more medical diagnoseso) 15 Yes Ambulatory aid None/bed rest/wheelchair/nurse 0 No Crutches/cane/walker 15 Yes Furniture 0 No Intravenous therapy Access/Saline/Heparin Lock 0 No Gait/Transferring Normal/ bed  rest/ wheelchair 0 No Weak (short steps with or without shuffle, stooped but able to lift head while walking, may 10 Yes seek support from furniture) Impaired (short steps with shuffle, may have difficulty arising from chair, head down, impaired 0 No balance) Mental Status Oriented to own ability 0 Yes Electronic Signature(s) Signed: 06/19/2022 12:54:01 PM By: [name], BSN, RN, CWS, [name] RN, BSN Entered By: [name], BSN, RN, CWS, [name] on 06/19/2022 08:52:08 [name] ([id number]) -------------------------------------------------------------------------------- Foot Assessment Details Patient Name: [name]. Date of Service: 06/19/2022 8:45 AM Medical Record Number: [id number] Patient Account Number: [id number] Date of Birth/Sex: Nov 13, 1945 (76 y.o. M) Treating RN: [name] Primary Care Tameia Rafferty: Fulton Reek Other Clinician: Referring Ruvim Risko: Self, Referral Treating Trevino Wyatt/Extender: Skipper Cliche in Treatment: 0 Foot Assessment Items Site Locations + = Sensation present, - = Sensation absent, C = Callus, U = Ulcer R = Redness, W = Warmth, M = Maceration, PU = Pre-ulcerative lesion F = Fissure, S = Swelling, D = Dryness Assessment Right: Left: Other Deformity: No No Prior Foot Ulcer: No No Prior Amputation: No No Charcot Joint: No No Ambulatory Status: Ambulatory With  Help Assistance Device: Cane Gait: Administrator, arts) Signed: 06/19/2022 12:54:01 PM By: [name], BSN, RN, CWS, [name] RN, BSN Entered By: [name], BSN, RN, CWS, [name] on 06/19/2022 08:53:04 [name] ([id number]) -------------------------------------------------------------------------------- Nutrition Risk Screening Details Patient Name: JAVONNE, DORKO. Date of Service: 06/19/2022 8:45 AM Medical Record Number: [id number] Patient Account Number: [id number] Date of Birth/Sex: April 06, 1946 (76 y.o. M) Treating RN: [name] Primary Care Juliya Magill: Fulton Reek Other Clinician: Referring Samanatha Brammer: Self, Referral Treating Quentin Shorey/Extender: Skipper Cliche in Treatment: 0 Height (in): 70 Weight (lbs): 265 Body Mass Index (BMI): 38 Nutrition Risk Screening Items Score Screening NUTRITION RISK SCREEN: I have an illness or condition that made me change the kind and/or amount of food I eat 0 No I eat fewer than two meals per day 0 No I eat few fruits and vegetables, or milk products 0 No I have three or more drinks of beer, liquor or wine almost every day 0 No I have tooth or mouth problems that make it hard for me to eat 0 No I don't always have enough money to buy the food I need 0 No I eat alone most of the time 0 No I take three or more different prescribed or over-the-counter drugs a day 1 Yes Without wanting to, I have lost or gained 10 pounds in the last six months 0 No I am not always physically able to shop, cook and/or feed myself 0 No Nutrition Protocols Good Risk Protocol Provide education on elevated Moderate Risk Protocol 0 blood sugars and impact on wound healing, as applicable High Risk Proctocol Risk Level: Good Risk Score: 1 Electronic Signature(s) Signed: 06/19/2022 12:54:01 PM By: [name], BSN, RN, CWS, [name] RN, BSN Entered By: [name], BSN, RN, CWS, [name] on 06/19/2022 08:52:27

## 2022-06-19 NOTE — Progress Notes (Signed)
LAM, MCCUBBINS ([id number]) Visit Report for 06/19/2022 Chief Complaint Document Details Patient Name: [name], [name]. Date of Service: 06/19/2022 8:45 AM Medical Record Number: [id number] Patient Account Number: [id number] Date of Birth/Sex: Jan 24, 1946 (76 y.o. M) Treating RN: Primary Care Provider: Fulton Reek Other Clinician: Referring Provider: Self, Referral Treating Provider/Extender: Skipper Cliche in Treatment: 0 Information Obtained from: Patient Chief Complaint Left heel ulcer Electronic Signature(s) Signed: 06/19/2022 9:13:33 AM By: Worthy Keeler PA-C Entered By: Worthy Keeler on 06/19/2022 09:13:33 [name] ([id number]) -------------------------------------------------------------------------------- Debridement Details Patient Name: [name], [name]. Date of Service: 06/19/2022 8:45 AM Medical Record Number: [id number] Patient Account Number: [id number] Date of Birth/Sex: 1946-05-09 (76 y.o. M) Treating RN: [name] Primary Care Provider: Fulton Reek Other Clinician: Referring Provider: Self, Referral Treating Provider/Extender: Skipper Cliche in Treatment: 0 Debridement Performed for Wound #10 Left Calcaneus Assessment: Performed By: Physician Tommie Sams., PA-C Debridement Type: Debridement Severity of Tissue Pre Debridement: Fat layer exposed Level of Consciousness (Pre- Awake and Alert procedure): Pre-procedure Verification/Time Out Yes - 09:18 Taken: Total Area Debrided (L x W): 1 (cm) x 1.8 (cm) = 1.8 (cm) Tissue and other material Viable, Non-Viable, Callus, Slough, Subcutaneous, Slough debrided: Level: Skin/Subcutaneous Tissue Debridement Description: Excisional Instrument: Curette Bleeding: Moderate Hemostasis Achieved: Pressure Response to Treatment: Procedure was tolerated well Level of Consciousness (Post- Awake and Alert procedure): Post Debridement Measurements of Total Wound Length: (cm) 1 Width: (cm)  1.8 Depth: (cm) 0.2 Volume: (cm) 0.283 Character of Wound/Ulcer Post Debridement: Stable Severity of Tissue Post Debridement: Fat layer exposed Post Procedure Diagnosis Same as Pre-procedure Electronic Signature(s) Signed: 06/19/2022 12:54:01 PM By: [name], BSN, RN, CWS, [name] RN, BSN Signed: 06/19/2022 1:57:25 PM By: Worthy Keeler PA-C Entered By: [name], BSN, RN, CWS, [name] on 06/19/2022 09:20:42 [name] ([id number]) -------------------------------------------------------------------------------- Debridement Details Patient Name: [name], [name]. Date of Service: 06/19/2022 8:45 AM Medical Record Number: [id number] Patient Account Number: [id number] Date of Birth/Sex: [birth year] (76 y.o. M) Treating RN: [name] Primary Care Provider: Fulton Reek Other Clinician: Referring Provider: Self, Referral Treating Provider/Extender: Skipper Cliche in Treatment: 0 Debridement Performed for Wound #11 Left,Midline,Plantar Calcaneus Assessment: Performed By: Physician Tommie Sams., PA-C Debridement Type: Debridement Severity of Tissue Pre Debridement: Fat layer exposed Level of Consciousness (Pre- Awake and Alert procedure): Pre-procedure Verification/Time Out Yes - 09:18 Taken: Total Area Debrided (L x W): 0.2 (cm) x 0.4 (cm) = 0.08 (cm) Tissue and other material Viable, Non-Viable, Callus, Slough, Subcutaneous, Slough debrided: Level: Skin/Subcutaneous Tissue Debridement Description: Excisional Instrument: Curette Bleeding: Moderate Hemostasis Achieved: Pressure Response to Treatment: Procedure was tolerated well Level of Consciousness (Post- Awake and Alert procedure): Post Debridement Measurements of Total Wound Length: (cm) 0.2 Width: (cm) 0.4 Depth: (cm) 0.1 Volume: (cm) 0.006 Character of Wound/Ulcer Post Debridement: Stable Severity of Tissue Post Debridement: Fat layer exposed Post Procedure Diagnosis Same as Pre-procedure Electronic  Signature(s) Signed: 06/19/2022 12:54:01 PM By: [name], BSN, RN, CWS, [name] RN, BSN Signed: 06/19/2022 1:57:25 PM By: Worthy Keeler PA-C Entered By: [name], BSN, RN, CWS, [name] on 06/19/2022 09:26:13 [name] ([id number]) -------------------------------------------------------------------------------- HPI Details Patient Name: [name], [name]. Date of Service: 06/19/2022 8:45 AM Medical Record Number: [id number] Patient Account Number: [id number] Date of Birth/Sex: 10-Feb-1946 (76 y.o. M) Treating RN: Primary Care Provider: Fulton Reek Other Clinician: Referring Provider: Self, Referral Treating Provider/Extender: Skipper Cliche in Treatment: 0 History of Present Illness HPI Description: 09/24/2020 on evaluation today patient presents today for a  heel ulcer that he tells me has been present for about 2 years. He has been seeing podiatry and they have been attempting to manage this including what sounds to be a total contact cast, Unna boot, and just standard dressings otherwise as well. Most recently has been using triple antibiotic ointment. With that being said unfortunately despite everything he really has not had any significant improvement. He tells me that he cannot even really remember exactly how this began but he presumed it may have rubbed on his shoes or something of that nature. 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 He tells me that this does seem to help. Fortunately there is no signs of active infection at this time. His most recent hemoglobin A1c was 8.1 though he knows this was this year he cannot tell me the exact time. His fluid pills currently to help with some of the lower extremity  edema although he does obviously have signs of venous stasis/lymphedema. Currently there is no evidence of active infection. No fevers, chills, nausea, vomiting, or diarrhea. Patient has had fairly recent ABIs which were performed on 07/19/2020 and revealed that he has normal findings in both the ankle and toe locations bilaterally. His ABI on the right was 1.09 on the left was 1.08 with a TBI on the right of 0.88 and on the left of 0.94. Triphasic flow was noted throughout. 10/08/2020 on evaluation today patient appears to be doing pretty well in regard to his left heel currently in fact this is doing a great job and seems to be healing quite nicely. Unfortunately on his right leg he had a pile of wood that actually fell on him injuring his right leg this is somewhat erythematous has me concerned little bit about cellulitis though there is not really a good area to culture at this point. 10/24/2020 upon evaluation today patient appears to be doing well with regard to his heel ulcer. He is showing signs of improvement which is great news. His right leg is completely healed. Overall I feel like he is doing excellent and there is no signs of infection. 11/07/2020 upon evaluation today patient appears to be doing well with regard to his heel ulcer. He tells me that last week when he was unable to come in his wife actually thought that the wound was very close to closing if not closed. Then it began to "reopen again". I really feel like what may have happened as the collagen may have dried over the wound bed and that because that misunderstanding with thinking that the wound was healing. With that being said I did not see it last week I do not know that for certain. Either way I feel like he is doing great today I see no signs of infection at this point. 11/26/2020 upon inspection today patient appears to be doing decently well in regard to his heel ulcer. He has been tolerating the dressing changes without  complication. Fortunately there is no sign of active infection at this time. No fevers, chills, nausea, vomiting, or diarrhea. 12/10/2020 upon evaluation today patient appears to be doing fairly well in regard to the wound on his heel as well as what appears to be a  new wound of the left first metatarsal head plantar aspect. This seems to be an area that was callus that has split as the patient tells me has been trying to walk on his toes more has probably where this came from. With that being said there does not appear to be signs of active infection which is great news. 12/17/2020 upon evaluation today patient actually appears to be making good progress currently. Fortunately there is no evidence of active infection at this time. Overall I feel like he is very close to complete closure. 12/26/2020 upon evaluation today patient appears to be doing excellent in regard to his wounds. In fact I am not certain that these are not even completely healed on initial inspection. Overall I am very pleased with where things stand today. Good news is that they are healed he is actually get ready to go out of town and that will be helpful as well as he will be a full part of the time. Readmission: 02/04/2021 upon evaluation today patient appears to be doing well at this point in regard to his left heel that I previously saw him for. Unfortunately he is having issues with his right lower extremity. He has significant wounds at this point he also has erythema noted there is definite signs of cellulitis which is unfortunate. With that being said I think we do need to address this sooner rather than later. The good news is he did have a nice trip to the beach. He tells me that he had no issues during that time. 4/27; patient on Bactrim. Culture showed methicillin sensitive staph aureus therefore the Bactrim should be effective. 2 small areas on the right leg are healed the area on the mid aspect of the tibia almost 100%  covered in a very adherent necrotic debris. We have been using silver alginate 02/20/2021 upon evaluation today patient appears to be doing well with regard to his wound. He is showing signs of improvement which is great news overall very pleased with where things stand today. No fevers, chills, nausea, vomiting, or diarrhea. 02/27/2021 upon evaluation today patient appears to be doing well with regard to his leg ulcer. He is tolerating the dressing changes and overall appears to be doing quite excellent. I am extremely pleased with where things stand and overall I think patient is making great progress. There is no sign of active infection at this time which is also great news. 03/06/2021 upon evaluation today patient appears to be doing excellent in regard to his wounds. In fact he appears to be completely healed today based on what I am seeing. This is excellent news and overall I am extremely pleased with where he stands. Overall the patient is happy to hear this as well this has been quite sometime coming. Readmission: 04/01/2021 patient unfortunately returns for readmission today. He tells me that he has been wearing his compression socks on the right leg daily [name], [name] ([id number]) and he does not really know what is going on and why his legs are doing what they are doing. We did therefore go ahead and probe deeper into exactly what has been going on with him today. Subsequently the patient tells me that when he gets up and what we would call "first thing in the morning" are really 2 different things". He does not tend to sleep well so he tells me that he will often wake up at 3:00 in the morning. He will then potentially going into the living  room to get in his chair where he may read a book for a little while and then potentially fall asleep back in his chair. He then subsequently wake up around 5:00 or so and then get up and in his words "putter around". This often will end with him  proceeding at some point around 8 AM or 9 AM to putting on his compression socks. With that being said this means that anywhere from the 3:00 in the morning till roughly around 9:00 in the morning he has no compression on yet he is sitting in his recliner, walking around and up and about, and this is at least 5 to 6 hours of noncompressed time. That may be our issue here but I did not realize until we discussed this further today. Fortunately there does not appear to be any signs of active infection at this time which is great news. With that being said he has multiple wounds of the bilateral lower extremities. 04/10/2021 upon evaluation today patient appears to be doing well with regard to his legs for the most part. He does look like he had some injury where his wrap slid down nonetheless he did some "doctoring on them". I do feel like most of the areas honestly have cleared back up which is great news. There does not appear to be any signs of active infection at this time which is also great news. No fevers, chills, nausea, vomiting, or diarrhea. 04/18/2021 upon evaluation today patient appears to be doing well with regard to his wounds in fact he appears to be completely healed which is great news. Fortunately there does not appear to be any signs of active infection which is great news. No fevers, chills, nausea, vomiting, or diarrhea. READMISSION 07/28/2021 This is a now 76 year old man we have had in this clinic for quite a bit of this year. 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 He wears compression stockings. 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 He comes in an old running shoes although he says he wears better shoes at home. His history is that he noticed a blister in the right posterior heel. This open. He has been applying Neosporin to it currently the wound measures 2 x  1.4 cm. 100% slough covered. Not really offloading this in any rigorous way. Past medical history is essentially unchanged he has paroxysmal atrial fibrillation on Coumadin type 2 diabetes with a recent hemoglobin A1c of 7 on metformin. ABI in our clinic was 0.98 on the right 08/05/2021 upon evaluation today patient appears to be doing well with regard to his heel ulcer. Fortunately there does not appear to be any signs of active infection at this time. Overall I been very pleased with where things seem to be currently as far as the wound healing is concerned. Again this is first a lot of seeing him Dr. Dellia Nims readmitted him last week. Nonetheless I think we are definitely making some progress here. 08/11/2021 upon evaluation today patient's wound on the heel actually showing signs of excellent improvement. I am actually very pleased with where things stand currently. No fevers, chills, nausea, vomiting, or diarrhea. There does not appear to be any need for sharp debridement today either which is also great news. 08/25/2021 upon evaluation today patient appears to be doing well with regard to his heel ulcer. This is actually looking significantly improved compared to last time I saw  him. Fortunately there does not appear to be any evidence of active infection at this time. No fevers, chills, nausea, vomiting, or diarrhea. 09/01/2021 upon evaluation today patient appears to be doing decently well in regard to his wounds. Fortunately there does not appear to be any signs of active infection at this time which is great news and overall very pleased in that regard. I do not see any evidence of active infection systemically which is great news as well. No fevers, chills, nausea, vomiting, or diarrhea. I think that the heel is making excellent progress. 09/15/2021 upon evaluation today patient's heel unfortunately is significantly worse compared to what it was previous. I do believe that at this time he  would benefit from switching back to something a little bit more offloading from the cushion shoe he has right now this is more of a heel protector what I really need is a Prevalon offloading boot which I think is good to do a lot better for him than just the small heel protector. 09/23/2021 upon evaluation today patient appears to be doing a little better in regards to last week's visit. Overall I think that he is making good progress which is great news and there does not appear to be any evidence of active infection at this time. No fevers, chills, nausea, vomiting, or diarrhea. 09/30/2021 upon evaluation patient's heel is actually showing signs of good improvement which is great news. Fortunately there does not appear to be any evidence of active infection locally nor systemically at this point. No fevers, chills, nausea, vomiting, or diarrhea. 10/07/2021 upon evaluation today patient appears to be doing well currently in regard to his heel ulcer. He has been tolerating the dressing changes without complication. Fortunately I do not see any signs of active infection at this time which is great news. No fevers, chills, nausea, vomiting, or diarrhea. 12/27; wound is measuring smaller. We have been using silver alginate 10/28/2021 upon evaluation today patient appears to be doing well currently in regard to his heel ulcer. I am actually very pleased with where things stand and I think he is making excellent progress. Fortunately I do not see any signs of active infection locally nor systemically at this point which is great news. Nonetheless I do believe that the patient would benefit from a new offloading shoe and has been using it Velcro is no longer functioning properly and he tells me that he almost tripped and fell because of that today. Obviously I do not want him following that would be very bad. 11/11/2021 upon evaluation today patient appears to be doing excellent in regard to his wound. In  fact this appears to be completely healed based on what I see currently. I do not see any signs of anything open or draining and I did double check just by clearing away some of the callus around the edges of the wound to ensure that there was nothing still open or hiding underneath. Once this was cleared away it appeared that the patient was doing significantly better at this time which is great news and there was nothing actually open. Readmission: [name], [name] ([id number]) 06-19-2022 upon evaluation today patient appears to be doing well currently in regard to his heel ulcer all things considered. He unfortunately is having a bit of breakdown in general as far as the heel is concerned not nearly as bad as what we noted last time he was here in the clinic. The good news is I do think that  this should hopefully heal much more rapidly than what we noted last time. His past medical history has not changed he is on Coumadin. Electronic Signature(s) Signed: 06/19/2022 10:54:12 AM By: Worthy Keeler PA-C Entered By: Worthy Keeler on 06/19/2022 10:54:12 [name], [name] ([id number]) -------------------------------------------------------------------------------- Physical Exam Details Patient Name: GRAE, CANNATA Date of Service: 06/19/2022 8:45 AM Medical Record Number: [id number] Patient Account Number: [id number] Date of Birth/Sex: [birth year] (76 y.o. M) Treating RN: Primary Care Provider: Fulton Reek Other Clinician: Referring Provider: Self, Referral Treating Provider/Extender: Skipper Cliche in Treatment: 0 Constitutional patient is hypertensive.. Patient's pulse is actually on the slower side. He was measuring at 50 today. With that being said he is unsure if this is normal for him or not I recommended follow-up with PCP.Marland Kitchen respirations regular, non-labored and within target range for patient.. Eyes conjunctiva clear no eyelid edema noted. pupils equal round and reactive to  light and accommodation. Ears, Nose, Mouth, and Throat no gross abnormality of ear auricles or external auditory canals. normal hearing noted during conversation. mucus membranes moist. Respiratory normal breathing without difficulty. Cardiovascular 1+ dorsalis pedis/posterior tibialis pulses. no clubbing, cyanosis, significant edema, <3 sec cap refill. Musculoskeletal normal gait and posture. no significant deformity or arthritic changes, no loss or range of motion, no clubbing. Psychiatric this patient is able to make decisions and demonstrates good insight into disease process. Alert and Oriented x 3. pleasant and cooperative. Notes Upon inspection patient's heel ulcer actually showed signs of some callus and slough on the surface of the wound and around the edges of the wound. He tolerated this today without complication and postdebridement the wound bed actually appears to be doing much better. Electronic Signature(s) Signed: 06/19/2022 10:55:24 AM By: Worthy Keeler PA-C Entered By: Worthy Keeler on 06/19/2022 10:55:24 [name] ([id number]) -------------------------------------------------------------------------------- Physician Orders Details Patient Name: [name], [name]. Date of Service: 06/19/2022 8:45 AM Medical Record Number: [id number] Patient Account Number: [id number] Date of Birth/Sex: [birth year] (76 y.o. 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 Wound #11  Left,Midline,Plantar Calcaneus o Return Appointment in 2 weeks. Bathing/ Shower/ Hygiene o May shower; gently cleanse wound with antibacterial soap, rinse and pat dry prior to dressing wounds Off-Loading o Open toe surgical shoe with peg assist. Wound Treatment Wound #10 - Calcaneus Wound Laterality: Left Primary Dressing: Prisma 4.34 (in) (DME) (Generic) 3 x Per Week/30 Days Discharge Instructions: Moisten w/normal saline or sterile water; Cover wound as directed. Do not remove from wound bed. Secondary Dressing: ABD Pad 5x9 (in/in) (DME) (Generic) 3 x Per Week/30 Days Discharge Instructions: Cover with ABD pad Secured With: Kerlix Roll Sterile or Non-Sterile 6-ply 4.5x4 (yd/yd) 3 x Per Week/30 Days Discharge Instructions: Apply Kerlix as directed Wound #11 - Calcaneus Wound Laterality: Plantar, Left, Midline Primary Dressing: Prisma 4.34 (in) (DME) (Generic) 3 x Per Week/30 Days Discharge Instructions: Moisten w/normal saline or sterile water; Cover wound as directed. Do not remove from wound bed. Secondary Dressing: ABD Pad 5x9 (in/in) (DME) (Generic) 3 x Per Week/30 Days Discharge Instructions: Cover with ABD pad Secured With:  Kerlix Roll Sterile or Non-Sterile 6-ply 4.5x4 (yd/yd) 3 x Per Week/30 Days Discharge Instructions: Apply Kerlix as directed Electronic Signature(s) Signed: 06/19/2022 12:54:01 PM By: [name], BSN, RN, CWS, [name] RN, BSN Signed: 06/19/2022 1:57:25 PM By: Worthy Keeler PA-C Entered By: [name], BSN, RN, CWS, [name] on 06/19/2022 09:31:24 [name] ([id number]) -------------------------------------------------------------------------------- Problem List Details Patient Name: IAN, [name]. Date of Service: 06/19/2022 8:45 AM Medical Record Number: [id number] Patient Account Number: [id number] Date of Birth/Sex: 1946-05-09 (76 y.o. M) Treating RN: Primary Care Provider: Fulton Reek Other Clinician: Referring Provider: Self, Referral Treating  Provider/Extender: Skipper Cliche in Treatment: 0 Active Problems ICD-10 Encounter Code Description Active Date MDM Diagnosis E11.621 Type 2 diabetes mellitus with foot ulcer 06/19/2022 No Yes L97.522 Non-pressure chronic ulcer of other part of left foot with fat layer 06/19/2022 No Yes exposed E11.42 Type 2 diabetes mellitus with diabetic polyneuropathy 06/19/2022 No Yes Inactive Problems Resolved Problems Electronic Signature(s) Signed: 06/19/2022 9:13:02 AM By: Worthy Keeler PA-C Entered By: Worthy Keeler on 06/19/2022 09:13:02 [name] ([id number]) -------------------------------------------------------------------------------- Progress Note Details Patient Name: [name]. Date of Service: 06/19/2022 8:45 AM Medical Record Number: [id number] Patient Account Number: [id number] Date of Birth/Sex: September 02, 1946 (76 y.o. M) Treating RN: Primary Care Provider: Fulton Reek Other Clinician: Referring Provider: Self, Referral Treating Provider/Extender: Skipper Cliche in Treatment: 0 Subjective Chief Complaint Information obtained from Patient Left heel ulcer History of Present Illness (HPI) 09/24/2020 on evaluation today patient presents today for a heel ulcer that he tells me has been present for about 2 years. He has been seeing podiatry and they have been attempting to manage this including what sounds to be a total contact cast, Unna boot, and just standard dressings otherwise as well. Most recently has been using triple antibiotic ointment. With that being said unfortunately despite everything he really has not had any significant improvement. He tells me that he cannot even really remember exactly how this began but he presumed it may have rubbed on his shoes or something of that nature. 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 He tells me that this does seem to help. Fortunately there is no signs of active infection at this time. His most recent hemoglobin A1c was 8.1 though he knows this was this year he cannot tell me the exact time. His fluid pills currently to help with some of the lower extremity edema although he does obviously have signs of venous stasis/lymphedema. Currently there is no evidence of active infection. No fevers, chills, nausea, vomiting, or diarrhea. Patient has had fairly recent ABIs which were performed on 07/19/2020 and revealed that he has normal findings in both the ankle and toe locations bilaterally. His ABI on the right was 1.09 on the left was 1.08 with a TBI on the right of 0.88 and on the left of 0.94. Triphasic flow was noted throughout. 10/08/2020 on evaluation today patient appears to be doing pretty well in regard to his left heel currently in fact this is doing a great job and seems to be healing quite nicely. Unfortunately on his right leg he had a pile of wood that actually fell on him injuring his right leg this  is somewhat erythematous has me concerned little bit about cellulitis though there is not really a good area to culture at this point. 10/24/2020 upon evaluation today patient appears to be doing well with regard to his heel ulcer. He is showing signs of improvement which is great news. His right leg is completely healed. Overall I feel like he is doing excellent and there is no signs of infection. 11/07/2020 upon evaluation today patient appears to be doing well with regard to his heel ulcer. He tells me that last week when he was unable to come in his wife actually thought that the wound was very close to closing if not closed. Then it began to "reopen again". I really feel like what may have happened as the collagen may  have dried over the wound bed and that because that misunderstanding with thinking that the wound was healing. With that being said I did not see it last week I do not know that for certain. Either way I feel like he is doing great today I see no signs of infection at this point. 11/26/2020 upon inspection today patient appears to be doing decently well in regard to his heel ulcer. He has been tolerating the dressing changes without complication. Fortunately there is no sign of active infection at this time. No fevers, chills, nausea, vomiting, or diarrhea. 12/10/2020 upon evaluation today patient appears to be doing fairly well in regard to the wound on his heel as well as what appears to be a new wound of the left first metatarsal head plantar aspect. This seems to be an area that was callus that has split as the patient tells me has been trying to walk on his toes more has probably where this came from. With that being said there does not appear to be signs of active infection which is great news. 12/17/2020 upon evaluation today patient actually appears to be making good progress currently. Fortunately there is no evidence of active infection at this time. Overall I feel like he is very close to complete closure. 12/26/2020 upon evaluation today patient appears to be doing excellent in regard to his wounds. In fact I am not certain that these are not even completely healed on initial inspection. Overall I am very pleased with where things stand today. Good news is that they are healed he is actually get ready to go out of town and that will be helpful as well as he will be a full part of the time. Readmission: 02/04/2021 upon evaluation today patient appears to be doing well at this point in regard to his left heel that I previously saw him for. Unfortunately he is having issues with his right lower extremity. He has significant wounds at this point he also has erythema noted there is definite signs of  cellulitis which is unfortunate. With that being said I think we do need to address this sooner rather than later. The good news is he did have a nice trip to the beach. He tells me that he had no issues during that time. 4/27; patient on Bactrim. Culture showed methicillin sensitive staph aureus therefore the Bactrim should be effective. 2 small areas on the right leg are healed the area on the mid aspect of the tibia almost 100% covered in a very adherent necrotic debris. We have been using silver alginate 02/20/2021 upon evaluation today patient appears to be doing well with regard to his wound. He is showing signs of improvement which is great  news overall very pleased with where things stand today. No fevers, chills, nausea, vomiting, or diarrhea. 02/27/2021 upon evaluation today patient appears to be doing well with regard to his leg ulcer. He is tolerating the dressing changes and overall appears to be doing quite excellent. I am extremely pleased with where things stand and overall I think patient is making great progress. There is no sign of active infection at this time which is also great news. 03/06/2021 upon evaluation today patient appears to be doing excellent in regard to his wounds. In fact he appears to be completely healed today based on what I am seeing. This is excellent news and overall I am extremely pleased with where he stands. Overall the patient is happy to hear [name], [name] ([id number]) this as well this has been quite sometime coming. Readmission: 04/01/2021 patient unfortunately returns for readmission today. He tells me that he has been wearing his compression socks on the right leg daily and he does not really know what is going on and why his legs are doing what they are doing. We did therefore go ahead and probe deeper into exactly what has been going on with him today. Subsequently the patient tells me that when he gets up and what we would call "first thing in  the morning" are really 2 different things". He does not tend to sleep well so he tells me that he will often wake up at 3:00 in the morning. He will then potentially going into the living room to get in his chair where he may read a book for a little while and then potentially fall asleep back in his chair. He then subsequently wake up around 5:00 or so and then get up and in his words "putter around". This often will end with him proceeding at some point around 8 AM or 9 AM to putting on his compression socks. With that being said this means that anywhere from the 3:00 in the morning till roughly around 9:00 in the morning he has no compression on yet he is sitting in his recliner, walking around and up and about, and this is at least 5 to 6 hours of noncompressed time. That may be our issue here but I did not realize until we discussed this further today. Fortunately there does not appear to be any signs of active infection at this time which is great news. With that being said he has multiple wounds of the bilateral lower extremities. 04/10/2021 upon evaluation today patient appears to be doing well with regard to his legs for the most part. He does look like he had some injury where his wrap slid down nonetheless he did some "doctoring on them". I do feel like most of the areas honestly have cleared back up which is great news. There does not appear to be any signs of active infection at this time which is also great news. No fevers, chills, nausea, vomiting, or diarrhea. 04/18/2021 upon evaluation today patient appears to be doing well with regard to his wounds in fact he appears to be completely healed which is great news. Fortunately there does not appear to be any signs of active infection which is great news. No fevers, chills, nausea, vomiting, or diarrhea. READMISSION 07/28/2021 This is a now 76 year old man we have had in this clinic for quite a bit of this year. He has been in here with  bilateral lower extremity leg wounds probably secondary to chronic venous insufficiency. He wears compression  stockings. 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 He comes in an old running shoes although he says he wears better shoes at home. His history is that he noticed a blister in the right posterior heel. This open. He has been applying Neosporin to it currently the wound measures 2 x 1.4 cm. 100% slough covered. Not really offloading this in any rigorous way. Past medical history is essentially unchanged he has paroxysmal atrial fibrillation on Coumadin type 2 diabetes with a recent hemoglobin A1c of 7 on metformin. ABI in our clinic was 0.98 on the right 08/05/2021 upon evaluation today patient appears to be doing well with regard to his heel ulcer. Fortunately there does not appear to be any signs of active infection at this time. Overall I been very pleased with where things seem to be currently as far as the wound healing is concerned. Again this is first a lot of seeing him Dr. Dellia Nims readmitted him last week. Nonetheless I think we are definitely making some progress here. 08/11/2021 upon evaluation today patient's wound on the heel actually showing signs of excellent improvement. I am actually very pleased with where things stand currently. No fevers, chills, nausea, vomiting, or diarrhea. There does not appear to be any need for sharp debridement today either which is also great news. 08/25/2021 upon evaluation today patient appears to be doing well with regard to his heel ulcer. This is actually looking significantly improved compared to last time I saw him. Fortunately there does not appear to be any evidence of active infection at this time. No fevers, chills, nausea, vomiting, or diarrhea. 09/01/2021 upon evaluation today patient appears to be doing decently well in regard to his wounds. Fortunately there does not appear to be any signs of  active infection at this time which is great news and overall very pleased in that regard. I do not see any evidence of active infection systemically which is great news as well. No fevers, chills, nausea, vomiting, or diarrhea. I think that the heel is making excellent progress. 09/15/2021 upon evaluation today patient's heel unfortunately is significantly worse compared to what it was previous. I do believe that at this time he would benefit from switching back to something a little bit more offloading from the cushion shoe he has right now this is more of a heel protector what I really need is a Prevalon offloading boot which I think is good to do a lot better for him than just the small heel protector. 09/23/2021 upon evaluation today patient appears to be doing a little better in regards to last week's visit. Overall I think that he is making good progress which is great news and there does not appear to be any evidence of active infection at this time. No fevers, chills, nausea, vomiting, or diarrhea. 09/30/2021 upon evaluation patient's heel is actually showing signs of good improvement which is great news. Fortunately there does not appear to be any evidence of active infection locally nor systemically at this point. No fevers, chills, nausea, vomiting, or diarrhea. 10/07/2021 upon evaluation today patient appears to be doing well currently in regard to his heel ulcer. He has been tolerating the dressing changes without complication. Fortunately I do not see any signs of active infection at this time which is great news. No fevers, chills, nausea, vomiting, or diarrhea. 12/27; wound is measuring smaller. We have been using silver alginate 10/28/2021 upon evaluation today patient appears to be doing well  currently in regard to his heel ulcer. I am actually very pleased with where things stand and I think he is making excellent progress. Fortunately I do not see any signs of active infection  locally nor systemically at this point which is great news. Nonetheless I do believe that the patient would benefit from a new offloading shoe and has been using it Velcro is no longer functioning properly and he tells me that he almost tripped and fell because of that today. Obviously I do not want him following that would be very bad. 11/11/2021 upon evaluation today patient appears to be doing excellent in regard to his wound. In fact this appears to be completely healed based on what I see currently. I do not see any signs of anything open or draining and I did double check just by clearing away some of the callus [name], [name]. ([id number]) around the edges of the wound to ensure that there was nothing still open or hiding underneath. Once this was cleared away it appeared that the patient was doing significantly better at this time which is great news and there was nothing actually open. Readmission: 06-19-2022 upon evaluation today patient appears to be doing well currently in regard to his heel ulcer all things considered. He unfortunately is having a bit of breakdown in general as far as the heel is concerned not nearly as bad as what we noted last time he was here in the clinic. The good news is I do think that this should hopefully heal much more rapidly than what we noted last time. His past medical history has not changed he is on Coumadin. Patient History Information obtained from Patient. Allergies tetracycline (Reaction: hives) Social History Former smoker - ended on 10/20/1995, Marital Status - Married, Alcohol Use - Rarely, Drug Use - No History, Caffeine Use - Daily. Medical History Eyes Patient has history of Cataracts - removed Cardiovascular Patient has history of Arrhythmia, Coronary Artery Disease - valve replacement, Hypertension - medication Endocrine Patient has history of Type II Diabetes Musculoskeletal Patient has history of Osteoarthritis - hands, right  knee Neurologic Patient has history of Neuropathy - feet Objective Constitutional patient is hypertensive.. Patient's pulse is actually on the slower side. He was measuring at 50 today. With that being said he is unsure if this is normal for him or not I recommended follow-up with PCP.Marland Kitchen respirations regular, non-labored and within target range for patient.. Vitals Time Taken: 8:46 AM, Height: 70 in, Weight: 265 lbs, BMI: 38, Temperature: 98.1 F, Pulse: 50 bpm, Respiratory Rate: 18 breaths/min, Blood Pressure: 159/76 mmHg. Eyes conjunctiva clear no eyelid edema noted. pupils equal round and reactive to light and accommodation. Ears, Nose, Mouth, and Throat no gross abnormality of ear auricles or external auditory canals. normal hearing noted during conversation. mucus membranes moist. Respiratory normal breathing without difficulty. Cardiovascular 1+ dorsalis pedis/posterior tibialis pulses. no clubbing, cyanosis, significant edema, Musculoskeletal normal gait and posture. no significant deformity or arthritic changes, no loss or range of motion, no clubbing. Psychiatric this patient is able to make decisions and demonstrates good insight into disease process. Alert and Oriented x 3. pleasant and cooperative. General Notes: Upon inspection patient's heel ulcer actually showed signs of some callus and slough on the surface of the wound and around the edges of the wound. He tolerated this today without complication and postdebridement the wound bed actually appears to be doing much better. Integumentary (Hair, Skin) Wound #10 status is Open. Original cause of wound  was Gradually Appeared. The date acquired was: 06/05/2022. The wound is located on the Left Calcaneus. The wound measures 1cm length x 1.8cm width x 0.2cm depth; 1.414cm^2 area and 0.283cm^3 volume. There is Fat Layer (Subcutaneous Tissue) exposed. There is a medium amount of serous drainage noted. The wound margin is flat and  intact. There is no granulation [name], [name]. ([id number]) within the wound bed. There is a large (67-100%) amount of necrotic tissue within the wound bed including Adherent Slough. Wound #11 status is Open. Original cause of wound was Gradually Appeared. The date acquired was: 06/05/2022. The wound is located on the Left,Midline,Plantar Calcaneus. The wound measures 0.2cm length x 0.4cm width x 0.1cm depth; 0.063cm^2 area and 0.006cm^3 volume. There is Fat Layer (Subcutaneous Tissue) exposed. There is a large amount of sanguinous drainage noted. The wound margin is flat and intact. There is small (1-33%) friable granulation within the wound bed. There is no necrotic tissue within the wound bed. 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 There was a Excisional Skin/Subcutaneous Tissue Debridement with a total area of 1.8 sq cm performed by Tommie Sams., PA-C. With the following instrument(s): Curette to remove Viable and Non-Viable tissue/material. Material removed includes Callus, Subcutaneous Tissue, and Slough. No specimens were taken. A time out was conducted at 09:18, prior to the start of the procedure. A Moderate amount of bleeding was controlled with Pressure. The procedure was tolerated well. Post Debridement Measurements: 1cm length x 1.8cm width x 0.2cm depth; 0.283cm^3 volume. Character of Wound/Ulcer Post Debridement is stable. Severity of Tissue Post Debridement is: Fat layer exposed. Post procedure Diagnosis Wound #10: Same as Pre-Procedure Wound #11 Pre-procedure diagnosis of Wound #11 is a Diabetic Wound/Ulcer of the  Lower Extremity located on the Left,Midline,Plantar Calcaneus .Severity of Tissue Pre Debridement is: Fat layer exposed. There was a Excisional Skin/Subcutaneous Tissue Debridement with a total area of 0.08 sq cm performed by Tommie Sams., PA-C. With the following instrument(s): Curette to remove Viable and Non-Viable tissue/material. Material removed includes Callus, Subcutaneous Tissue, and Slough. No specimens were taken. A time out was conducted at 09:18, prior to the start of the procedure. A Moderate amount of bleeding was controlled with Pressure. The procedure was tolerated well. Post Debridement Measurements: 0.2cm length x 0.4cm width x 0.1cm depth; 0.006cm^3 volume. Character of Wound/Ulcer Post Debridement is stable. Severity of Tissue Post Debridement is: Fat layer exposed. Post procedure Diagnosis Wound #11: Same as Pre-Procedure Plan Follow-up Appointments: Wound #10 Left Calcaneus: Return Appointment in 2 weeks. Wound #11 Left,Midline,Plantar Calcaneus: Return Appointment in 2 weeks. Bathing/ Shower/ Hygiene: May shower; gently cleanse wound with antibacterial soap, rinse and pat dry prior to dressing wounds Off-Loading: Open toe surgical shoe with peg assist. WOUND #10: - Calcaneus Wound Laterality: Left Primary Dressing: Prisma 4.34 (in) (DME) (Generic) 3 x Per Week/30 Days Discharge Instructions: Moisten w/normal saline or sterile water; Cover wound as directed. Do not remove from wound bed. Secondary Dressing: ABD Pad 5x9 (in/in) (DME) (Generic) 3 x Per Week/30 Days Discharge Instructions: Cover with ABD pad Secured With: Kerlix Roll Sterile or Non-Sterile 6-ply 4.5x4 (yd/yd) 3 x Per Week/30 Days Discharge Instructions: Apply Kerlix as  directed WOUND #11: - Calcaneus Wound Laterality: Plantar, Left, Midline Primary Dressing: Prisma 4.34 (in) (DME) (Generic) 3 x Per Week/30 Days Discharge Instructions: Moisten w/normal saline or sterile water; Cover wound as  directed. Do not remove from wound bed. Secondary Dressing: ABD Pad 5x9 (in/in) (DME) (Generic) 3 x Per Week/30 Days Discharge Instructions: Cover with ABD pad [name], INGHAM ([id number]) Secured With: Kerlix Roll Sterile or Non-Sterile 6-ply 4.5x4 (yd/yd) 3 x Per Week/30 Days Discharge Instructions: Apply Kerlix as directed 1. I would recommend currently based on what we are seeing that we go ahead and initiate treatment with a silver collagen dressing which I think is can be a good option here. 2. Also can recommend ABD pad to cover followed by roll gauze to secure in place. 3. I am also can suggest the patient should continue with his offloading shoe he does have a heel offloading has been using already I think this is absolutely appropriate he should continue as such. We will see patient back for reevaluation in 2 weeks here in the clinic. If anything worsens or changes patient will contact our office for additional recommendations. Electronic Signature(s) Signed: 06/19/2022 10:56:05 AM By: Worthy Keeler PA-C Entered By: Worthy Keeler on 06/19/2022 10:56:04 [name] ([id number]) -------------------------------------------------------------------------------- ROS/PFSH Details Patient Name: ALPHA, MYSLIWIEC. Date of Service: 06/19/2022 8:45 AM Medical Record Number: [id number] Patient Account Number: [id number] Date of Birth/Sex: [name] 05, 1946 (76 y.o. M) Treating RN: [name] Primary Care Provider: Fulton Reek Other Clinician: Referring Provider: Self, Referral Treating Provider/Extender: Skipper Cliche in Treatment: 0 Information Obtained From Patient Eyes Medical History: Positive for: Cataracts - removed Cardiovascular Medical History: Positive for: Arrhythmia; Coronary Artery Disease - valve replacement; Hypertension - medication Endocrine Medical History: Positive for: Type II Diabetes Time with diabetes: 10 years Treated with: Oral agents Blood sugar  tested every day: No Musculoskeletal Medical History: Positive for: Osteoarthritis - hands, right knee Neurologic Medical History: Positive for: Neuropathy - feet HBO Extended History Items Eyes: Cataracts Immunizations Pneumococcal Vaccine: Received Pneumococcal Vaccination: No Implantable Devices None Family and Social History Former smoker - ended on 10/20/1995; Marital Status - Married; Alcohol Use: Rarely; Drug Use: No History; Caffeine Use: Daily; Financial Concerns: No; Food, Clothing or Shelter Needs: No; Support System Lacking: No; Transportation Concerns: No Engineer, maintenance) Signed: 06/19/2022 12:54:01 PM By: [name], BSN, RN, CWS, [name] RN, BSN Signed: 06/19/2022 1:57:25 PM By: Worthy Keeler PA-C Entered By: [name], BSN, RN, CWS, [name] on 06/19/2022 08:50:23 [name] ([id number]) -------------------------------------------------------------------------------- SuperBill Details Patient Name: MIKAI, MEINTS. Date of Service: 06/19/2022 Medical Record Number: [id number] Patient Account Number: [id number] Date of Birth/Sex: 1946-04-06 (76 y.o. M) Treating RN: Primary Care Provider: Fulton Reek Other Clinician: Referring Provider: Self, Referral Treating Provider/Extender: Skipper Cliche in Treatment: 0 Diagnosis Coding ICD-10 Codes Code Description E11.621 Type 2 diabetes mellitus with foot ulcer 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sed E11.42 Type 2 diabetes mellitus with diabetic polyneuropathy Facility Procedures CPT4 Code: 69678938 Description: 99213 - WOUND CARE VISIT-LEV 3 EST PT Modifier: Quantity: 1 CPT4 Code: 10175102 Description: 11042 - DEB SUBQ TISSUE 20 SQ CM/< Modifier: Quantity: 1 CPT4 Code: Description: ICD-10 Diagnosis Description 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 Modifier: sed Quantity: Physician Procedures CPT4 Code:  5852778 Description: 99213 - WC PHYS LEVEL 3 - EST PT Modifier: 25 Quantity: 1 CPT4 Code: Description: ICD-10 Diagnosis Description E11.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

## 2022-06-19 NOTE — Progress Notes (Signed)
[name] ([id number]) Visit Report for 06/19/2022 Allergy List Details Patient Name: [name], [name]. Date of Service: 06/19/2022 8:45 AM Medical Record Number: [id number] Patient Account Number: [id number] Date of Birth/Sex: 01-Jul-1946 (76 y.o. M) Treating RN: [name] Primary Care Daiveon Markman: Fulton Reek Other Clinician: Referring Jannifer Fischler: Self, Referral Treating Tysheem Accardo/Extender: Skipper Cliche in Treatment: 0 Allergies Active Allergies tetracycline Reaction: hives Allergy Notes Electronic Signature(s) Signed: 06/19/2022 12:54:01 PM By: [name], BSN, RN, CWS, [name] RN, BSN Entered By: [name], BSN, RN, CWS, [name] on 06/19/2022 08:49:58 [name] ([id number]) -------------------------------------------------------------------------------- Arrival Information Details Patient Name: [name], [name]. Date of Service: 06/19/2022 8:45 AM Medical Record Number: [id number] Patient Account Number: [id number] Date of Birth/Sex: 1946/10/18 (76 y.o. M) Treating RN: [name] Primary Care Sharif Rendell: Fulton Reek Other Clinician: Referring Lela Gell: Self, Referral Treating Wyndell Cardiff/Extender: Skipper Cliche in Treatment: 0 Visit Information Patient Arrived: [name] Time: 08:41 Transfer Assistance: None Patient Identification Verified: Yes Secondary Verification Process Completed: Yes Patient Requires Transmission-Based No Precautions: Patient Has Alerts: Yes Patient Alerts: Patient on Blood Thinner Warfarin Type II Diabetic History Since Last Visit Electronic Signature(s) Signed: 06/19/2022 12:54:01 PM By: [name], BSN, RN, CWS, [name] RN, BSN Entered By: [name], BSN, RN, CWS, [name] on 06/19/2022 08:46:00 [name] ([id number]) -------------------------------------------------------------------------------- Clinic Level of Care Assessment Details Patient Name: [name], [name]. Date of Service: 06/19/2022 8:45 AM Medical Record Number: [id number] Patient  Account Number: [id number] Date of Birth/Sex: Nov 07, 1945 (76 y.o. M) Treating RN: [name] Primary Care Dakota Stangl: Fulton Reek Other Clinician: Referring Rhen Kawecki: Self, Referral Treating Marland Reine/Extender: Skipper Cliche in Treatment: 0 Clinic Level of Care Assessment Items TOOL 1 Quantity Score []  - Use when EandM and Procedure is performed on INITIAL visit 0 ASSESSMENTS - Nursing Assessment / Reassessment X - General Physical Exam (combine w/ comprehensive assessment (listed just below) when performed on new 1 20 pt. evals) X- 1 25 Comprehensive Assessment (HX, ROS, Risk Assessments, Wounds Hx, etc.) ASSESSMENTS - Wound and Skin Assessment / Reassessment []  - Dermatologic / Skin Assessment (not related to wound area) 0 ASSESSMENTS - Ostomy and/or Continence Assessment and Care []  - Incontinence Assessment and Management 0 []  - 0 Ostomy Care Assessment and Management (repouching, etc.) PROCESS - Coordination of Care X - Simple Patient / Family Education for ongoing care 1 15 []  - 0 Complex (extensive) Patient / Family Education for ongoing care X- 1 10 Staff obtains Programmer, systems, Records, Test Results / Process Orders []  - 0 Staff telephones HHA, Nursing Homes / Clarify orders / etc []  - 0 Routine Transfer to another Facility (non-emergent condition) []  - 0 Routine Hospital Admission (non-emergent condition) X- 1 15 New Admissions / Biomedical engineer / Ordering NPWT, Apligraf, etc. []  - 0 Emergency Hospital Admission (emergent condition) PROCESS - Special Needs []  - Pediatric / Minor Patient Management 0 []  - 0 Isolation Patient Management []  - 0 Hearing / Language / Visual special needs []  - 0 Assessment of Community assistance (transportation, D/C planning, etc.) []  - 0 Additional assistance / Altered mentation []  - 0 Support Surface(s) Assessment (bed, cushion, seat, etc.) INTERVENTIONS - Miscellaneous []  - External ear exam 0 []  - 0 Patient Transfer  (multiple staff / Civil Service fast streamer / Similar devices) []  - 0 Simple Staple / Suture removal (25 or less) []  - 0 Complex Staple / Suture removal (26 or more) []  - 0 Hypo/Hyperglycemic Management (do not check if billed separately) X- 1 15 Ankle / Brachial Index (ABI) - do not  check if billed separately Has the patient been seen at the hospital within the last three years: Yes Total Score: 100 Level Of Care: New/Established - [address] [name] ([id number]) Electronic Signature(s) Signed: 06/19/2022 12:54:01 PM By: [name], BSN, RN, CWS, [name] RN, BSN Entered By: [name], BSN, RN, CWS, [name] on 06/19/2022 09:29:03 [name] ([id number]) -------------------------------------------------------------------------------- Encounter Discharge Information Details Patient Name: [name], [name]. Date of Service: 06/19/2022 8:45 AM Medical Record Number: [id number] Patient Account Number: [id number] Date of Birth/Sex: Mar 10, 1946 (76 y.o. M) Treating RN: [name] Primary Care Gene Colee: Fulton Reek Other Clinician: Referring [name]: Self, Referral Treating [name]/Extender: Skipper Cliche in Treatment: 0 Encounter Discharge Information Items Post Procedure Vitals Discharge Condition: Stable Temperature (F): 98.1 Ambulatory Status: Ambulatory Pulse (bpm): 50 Discharge Destination: Home Respiratory Rate (breaths/min): 16 Transportation: Private Auto Blood Pressure (mmHg): 159/76 Schedule Follow-up Appointment: Yes Clinical Summary of Care: Electronic Signature(s) Signed: 06/19/2022 12:54:01 PM By: [name], BSN, RN, CWS, [name] RN, BSN Entered By: [name], BSN, RN, CWS, [name] on 06/19/2022 09:30:42 [name] ([id number]) -------------------------------------------------------------------------------- Lower Extremity Assessment Details Patient Name: [name], [name]. Date of Service: 06/19/2022 8:45 AM Medical Record Number: [id number] Patient Account Number: [id number] Date of  Birth/Sex: 11-Dec-1945 (76 y.o. M) Treating RN: [name] Primary Care Tykesha Konicki: Fulton Reek Other Clinician: Referring Daviel Allegretto: Self, Referral Treating Pamelia Botto/Extender: Skipper Cliche in Treatment: 0 Edema Assessment Assessed: [Left: Yes] [Right: No] Edema: [Left: Ye] [Right: s] Calf Left: Right: Point of Measurement: From Medial Instep 41 cm Ankle Left: Right: Point of Measurement: From Medial Instep 24 cm Knee To Floor Left: Right: From Medial Instep 47 cm Vascular Assessment Pulses: Dorsalis Pedis Palpable: [Left:No] Doppler Audible: [Left:Yes] Posterior Tibial Palpable: [Left:Yes] Doppler Audible: [Left:Yes] Blood Pressure: Brachial: [Left:176] Ankle: [Left:Dorsalis Pedis: 169 0.96] [Right:Dorsalis Pedis: 171 0.97] Electronic Signature(s) Signed: 06/19/2022 12:54:01 PM By: [name], BSN, RN, CWS, [name] RN, BSN Entered By: [name], BSN, RN, CWS, [name] on 06/19/2022 09:07:57 [name] ([id number]) -------------------------------------------------------------------------------- Multi Wound Chart Details Patient Name: [name], [name]. Date of Service: 06/19/2022 8:45 AM Medical Record Number: [id number] Patient Account Number: [id number] Date of Birth/Sex: [birth year] (76 y.o. M) Treating RN: [name] Primary Care Mirna Sutcliffe: Fulton Reek Other Clinician: Referring Abree Romick: Self, Referral Treating Dezyre Hoefer/Extender: Skipper Cliche in Treatment: 0 Vital Signs Height(in): 70 Pulse(bpm): 50 Weight(lbs): 265 Blood Pressure(mmHg): 159/76 Body Mass Index(BMI): 38 Temperature(F): 98.1 Respiratory Rate(breaths/min): 18 Photos: [N/A:N/A] Wound Location: Left Calcaneus N/A N/A Wounding Event: Gradually Appeared N/A N/A Primary Etiology: Diabetic Wound/Ulcer of the Lower N/A N/A Extremity Secondary Etiology: Pressure Ulcer N/A N/A Comorbid History: Cataracts, Arrhythmia, Coronary N/A N/A Artery Disease, Hypertension, Type II Diabetes,  Osteoarthritis, Neuropathy Date Acquired: 06/05/2022 N/A N/A Weeks of Treatment: 0 N/A N/A Wound Status: Open N/A N/A Wound Recurrence: No N/A N/A Measurements L x W x D (cm) 1x1.8x0.2 N/A N/A Area (cm) : 1.414 N/A N/A Volume (cm) : 0.283 N/A N/A Classification: Grade 1 N/A N/A Exudate Amount: Medium N/A N/A Exudate Type: Serous N/A N/A Exudate Color: amber N/A N/A Wound Margin: Flat and Intact N/A N/A Granulation Amount: None Present (0%) N/A N/A Necrotic Amount: Large (67-100%) N/A N/A Exposed Structures: Fat Layer (Subcutaneous Tissue): N/A N/A Yes Fascia: No Tendon: No Muscle: No Joint: No Bone: No Treatment Notes Electronic Signature(s) Signed: 06/19/2022 12:54:01 PM By: [name], BSN, RN, CWS, [name] RN, BSN Entered By: [name], BSN, RN, CWS, [name] on 06/19/2022 09:19:28 [name] ([id number]) -------------------------------------------------------------------------------- [place] Details Patient Name: [name],  [name] J. Date of Service: 06/19/2022 8:45 AM Medical Record Number: [id number] Patient Account Number: [id number] Date of Birth/Sex: 1946/06/23 (76 y.o. M) Treating RN: [name] Primary Care Maddux First: Fulton Reek Other Clinician: Referring Lathon Adan: Self, Referral Treating Thurlow Gallaga/Extender: Skipper Cliche in Treatment: 0 Active Inactive Orientation to the Wound Care Program Nursing Diagnoses: Knowledge deficit related to the wound healing center program Goals: Patient/caregiver will verbalize understanding of the [place] Program Date Initiated: 06/19/2022 Target Resolution Date: 06/19/2022 Goal Status: Active Interventions: Provide education on orientation to the wound center Notes: Peripheral Neuropathy Nursing Diagnoses: Knowledge deficit related to disease process and management of peripheral neurovascular dysfunction Potential alteration in peripheral tissue perfusion (select prior to confirmation of  diagnosis) Goals: Patient/caregiver will verbalize understanding of disease process and disease management Date Initiated: 06/19/2022 Target Resolution Date: 06/19/2022 Goal Status: Active Interventions: Assess signs and symptoms of neuropathy upon admission and as needed Provide education on Management of Neuropathy and Related Ulcers Provide education on Management of Neuropathy upon discharge from the [facility] Notes: Pressure Nursing Diagnoses: Knowledge deficit related to causes and risk factors for pressure ulcer development Knowledge deficit related to management of pressures ulcers Potential for impaired tissue integrity related to pressure, friction, moisture, and shear Goals: Patient will remain free from development of additional pressure ulcers Date Initiated: 06/19/2022 Target Resolution Date: 06/19/2022 Goal Status: Active Patient/caregiver will verbalize risk factors for pressure ulcer development Date Initiated: 06/19/2022 Target Resolution Date: 06/19/2022 Goal Status: Active Patient/caregiver will verbalize understanding of pressure ulcer management Date Initiated: 06/19/2022 Target Resolution Date: 06/19/2022 Goal Status: Active Interventions: Assess: immobility, friction, shearing, incontinence upon admission and as needed Assess offloading mechanisms upon admission and as needed DAMEN, WINDSOR ([id number]) Assess potential for pressure ulcer upon admission and as needed Provide education on pressure ulcers Notes: Wound/Skin Impairment Nursing Diagnoses: Impaired tissue integrity Goals: Patient/caregiver will verbalize understanding of skin care regimen Date Initiated: 06/19/2022 Target Resolution Date: 06/19/2022 Goal Status: Active Ulcer/skin breakdown will have a volume reduction of 30% by week 4 Date Initiated: 06/19/2022 Target Resolution Date: 07/17/2022 Goal Status: Active Interventions: Assess patient/caregiver ability to obtain necessary supplies Assess  patient/caregiver ability to perform ulcer/skin care regimen upon admission and as needed Assess ulceration(s) every visit Provide education on ulcer and skin care Treatment Activities: Skin care regimen initiated : 06/19/2022 Topical wound management initiated : 06/19/2022 Notes: Electronic Signature(s) Signed: 06/19/2022 12:54:01 PM By: [name], BSN, RN, CWS, [name] RN, BSN Entered By: [name], BSN, RN, CWS, [name] on 06/19/2022 09:19:16 [name] ([id number]) -------------------------------------------------------------------------------- Pain Assessment Details Patient Name: [name], [name]. Date of Service: 06/19/2022 8:45 AM Medical Record Number: [id number] Patient Account Number: [id number] Date of Birth/Sex: [birth year] (76 y.o. M) Treating RN: [name] Primary Care Navika Hoopes: Fulton Reek Other Clinician: Referring Arrington Bencomo: Self, Referral Treating Sajan Cheatwood/Extender: Skipper Cliche in Treatment: 0 Active Problems Location of Pain Severity and Description of Pain Patient Has Paino No Site Locations Pain Management and Medication Current Pain Management: Electronic Signature(s) Signed: 06/19/2022 12:54:01 PM By: [name], BSN, RN, CWS, [name] RN, BSN Entered By: [name], BSN, RN, CWS, [name] on 06/19/2022 08:46:05 [name] ([id number]) -------------------------------------------------------------------------------- Patient/Caregiver Education Details Patient Name: [name], [name]. Date of Service: 06/19/2022 8:45 AM Medical Record Number: [id number] Patient Account Number: [id number] Date of Birth/Gender: [birth year] (76 y.o. M) Treating RN: [name] Primary Care Physician: Fulton Reek Other Clinician: Referring Physician: Self, Referral Treating Physician/Extender: Skipper Cliche in Treatment: 0 Education Assessment Education Provided To: Patient Education  Topics Provided Peripheral Neuropathy: Handouts: Diabetes, Neuropathy Methods: Explain/Verbal Responses:  State content correctly Pressure: Handouts: Preventing Pressure Ulcers Methods: Demonstration, Explain/Verbal Responses: State content correctly Welcome To [facility]: Handouts: Welcome To [facility] Methods: Demonstration, Explain/Verbal Responses: State content correctly Wound/Skin Impairment: Handouts: Caring for Your Ulcer Methods: Explain/Verbal Responses: State content correctly Electronic Signature(s) Signed: 06/19/2022 12:54:01 PM By: [name], BSN, RN, CWS, [name] RN, BSN Entered By: [name], BSN, RN, CWS, [name] on 06/19/2022 09:29:44 [name] ([id number]) -------------------------------------------------------------------------------- Wound Assessment Details Patient Name: [name], [name]. Date of Service: 06/19/2022 8:45 AM Medical Record Number: [id number] Patient Account Number: [id number] Date of Birth/Sex: 12-18-1945 (76 y.o. M) Treating RN: [name] Primary Care Brydan Downard: Fulton Reek Other Clinician: Referring Makaiya Geerdes: Self, Referral Treating Maciah Feeback/Extender: Skipper Cliche in Treatment: 0 Wound Status Wound Number: 10 Primary Diabetic Wound/Ulcer of the Lower Extremity Etiology: Wound Location: Left Calcaneus Secondary Pressure Ulcer Wounding Event: Gradually Appeared Etiology: Date Acquired: 06/05/2022 Wound Open Weeks Of Treatment: 0 Status: Clustered Wound: No Comorbid Cataracts, Arrhythmia, Coronary Artery Disease, History: Hypertension, Type II Diabetes, Osteoarthritis, Neuropathy Photos Wound Measurements Length: (cm) Width: (cm) Depth: (cm) Area: (cm) Volume: (cm) 1 % Reduction in Area: 1.8 % Reduction in Volume: 0.2 1.414 0.283 Wound Description Classification: Grade 1 Foul Odo Wound Margin: Flat and Intact Slough/F Exudate Amount: Medium Exudate Type: Serous Exudate Color: amber r After Cleansing: No ibrino Yes Wound Bed Granulation Amount: None Present (0%) Exposed Structure Necrotic Amount:  Large (67-100%) Fascia Exposed: No Necrotic Quality: Adherent Slough Fat Layer (Subcutaneous Tissue) Exposed: Yes Tendon Exposed: No Muscle Exposed: No Joint Exposed: No Bone Exposed: No Treatment Notes Wound #10 (Calcaneus) Wound Laterality: Left Cleanser Peri-Wound Care [name], [name] ([id number]) Topical Primary Dressing Prisma 4.34 (in) Discharge Instruction: Moisten w/normal saline or sterile water; Cover wound as directed. Do not remove from wound bed. Secondary Dressing ABD Pad 5x9 (in/in) Discharge Instruction: Cover with ABD pad Secured With Kerlix Roll Sterile or Non-Sterile 6-ply 4.5x4 (yd/yd) Discharge Instruction: Apply Kerlix as directed Compression Wrap Compression Stockings Add-Ons Electronic Signature(s) Signed: 06/19/2022 12:54:01 PM By: [name], BSN, RN, CWS, [name] RN, BSN Entered By: [name], BSN, RN, CWS, [name] on 06/19/2022 09:06:37 [name] ([id number]) -------------------------------------------------------------------------------- Wound Assessment Details Patient Name: [name], [name]. Date of Service: 06/19/2022 8:45 AM Medical Record Number: [id number] Patient Account Number: [id number] Date of Birth/Sex: 01-01-1946 (76 y.o. M) Treating RN: [name] Primary Care Tyliek Timberman: Fulton Reek Other Clinician: Referring Kaiyana Bedore: Self, Referral Treating Arli Bree/Extender: Skipper Cliche in Treatment: 0 Wound Status Wound Number: 11 Primary Diabetic Wound/Ulcer of the Lower Extremity Etiology: Wound Location: Left, Midline, Plantar Calcaneus Secondary Pressure Ulcer Wounding Event: Gradually Appeared Etiology: Date Acquired: 06/05/2022 Wound Open Weeks Of Treatment: 0 Status: Clustered Wound: No Comorbid Cataracts, Arrhythmia, Coronary Artery Disease, History: Hypertension, Type II Diabetes, Osteoarthritis, Neuropathy Photos Wound Measurements Length: (cm) Width: (cm) Depth: (cm) Area: (cm) Volume: (cm) 0.2 % Reduction in  Area: 0.4 % Reduction in Volume: 0.1 0.063 0.006 Wound Description Classification: Grade 1 Foul Wound Margin: Flat and Intact Sloug Exudate Amount: Large Exudate Type: Sanguinous Exudate Color: red Odor After Cleansing: No h/Fibrino No Wound Bed Granulation Amount: Small (1-33%) Exposed Structure Granulation Quality: Friable Fascia Exposed: No Necrotic Amount: None Present (0%) Fat Layer (Subcutaneous Tissue) Exposed: Yes Tendon Exposed: No Muscle Exposed: No Joint Exposed: No Bone Exposed: No Treatment Notes Wound #11 (Calcaneus) Wound Laterality: Plantar, Left, Midline Cleanser Peri-Wound Care EMANI, MORAD ([id number]) Topical Primary  Dressing Prisma 4.34 (in) Discharge Instruction: Moisten w/normal saline or sterile water; Cover wound as directed. Do not remove from wound bed. Secondary Dressing ABD Pad 5x9 (in/in) Discharge Instruction: Cover with ABD pad Secured With Kerlix Roll Sterile or Non-Sterile 6-ply 4.5x4 (yd/yd) Discharge Instruction: Apply Kerlix as directed Compression Wrap Compression Stockings Add-Ons Electronic Signature(s) Signed: 06/19/2022 12:54:01 PM By: [name], BSN, RN, CWS, [name] RN, BSN Entered By: [name], BSN, RN, CWS, [name] on 06/19/2022 09:24:49 [name] ([id number]) -------------------------------------------------------------------------------- [place] Details Patient Name: AARISH, ROCKERS. Date of Service: 06/19/2022 8:45 AM Medical Record Number: [id number] Patient Account Number: [id number] Date of Birth/Sex: [birth year] (76 y.o. M) Treating RN: [name] Primary Care Dorothy Polhemus: Fulton Reek Other Clinician: Referring Angely Dietz: Self, Referral Treating Magenta Schmiesing/Extender: Skipper Cliche in Treatment: 0 Vital Signs Time Taken: 08:46 Temperature (F): 98.1 Height (in): 70 Pulse (bpm): 50 Weight (lbs): 265 Respiratory Rate (breaths/min): 18 Body Mass Index (BMI): 38 Blood Pressure (mmHg): 159/76 Reference Range: 80  - 120 mg / dl Electronic Signature(s) Signed: 06/19/2022 12:54:01 PM By: [name], BSN, RN, CWS, [name] RN, BSN Entered By: [name], BSN, RN, CWS, [name] on 06/19/2022 08:46:39

## 2022-07-03 ENCOUNTER — Encounter: Payer: Medicare HMO | Admitting: Physician Assistant

## 2022-07-03 DIAGNOSIS — E11621 Type 2 diabetes mellitus with foot ulcer: Secondary | ICD-10-CM | POA: Diagnosis not present

## 2022-07-03 NOTE — Progress Notes (Signed)
BASTION, BOLGER ([id number]) Visit Report for 07/03/2022 Arrival Information Details Patient Name: [name], [name]. Date of Service: 07/03/2022 9:15 AM Medical Record Number: [id number] Patient Account Number: [id number] Date of Birth/Sex: 01/29/1946 (76 y.o. M) Treating RN: [name] Primary Care Matej Sappenfield: Fulton Reek Other Clinician: Referring Jonai Weyland: Fulton Reek Treating Iyah Laguna/Extender: Skipper Cliche in Treatment: 2 Visit Information History Since Last Visit Added or deleted any medications: Yes Patient Arrived: Cane Has Dressing in Place as Prescribed: Yes Arrival Time: 09:22 Has Footwear/Offloading in Place as Prescribed: Yes Accompanied By: self Left: Surgical Shoe with Pressure Relief Transfer Assistance: None Insole Patient Identification Verified: Yes Pain Present Now: No Patient Requires Transmission-Based No Precautions: Patient Has Alerts: Yes Patient Alerts: Patient on Blood Thinner Warfarin Type II Diabetic Electronic Signature(s) Signed: 07/03/2022 11:29:22 AM By: [name], BSN, RN, CWS, [name] RN, BSN Entered By: [name], BSN, RN, CWS, [name] on 07/03/2022 09:22:38 [name] ([id number]) -------------------------------------------------------------------------------- Encounter Discharge Information Details Patient Name: [name]. Date of Service: 07/03/2022 9:15 AM Medical Record Number: [id number] Patient Account Number: [id number] Date of Birth/Sex: 03/22/1946 (75 y.o. M) Treating RN: [name] Primary Care Ciela Mahajan: Fulton Reek Other Clinician: Referring Filomeno Cromley: Fulton Reek Treating Abhinav Mayorquin/Extender: Skipper Cliche in Treatment: 2 Encounter Discharge Information Items Post Procedure Vitals Discharge Condition: Stable Temperature (F): 98.3 Ambulatory Status: Ambulatory Pulse (bpm): 76 Discharge Destination: Home Respiratory Rate (breaths/min): 18 Transportation: Private Auto Blood Pressure (mmHg):  184/73 Accompanied By: self Schedule Follow-up Appointment: Yes Clinical Summary of Care: Electronic Signature(s) Signed: 07/03/2022 11:29:22 AM By: [name], BSN, RN, CWS, [name] RN, BSN Entered By: [name], BSN, RN, CWS, [name] on 07/03/2022 09:55:30 [name] ([id number]) -------------------------------------------------------------------------------- Lower Extremity Assessment Details Patient Name: [name]. Date of Service: 07/03/2022 9:15 AM Medical Record Number: [id number] Patient Account Number: [id number] Date of Birth/Sex: 26-[name]-1946 (76 y.o. M) Treating RN: [name] Primary Care Aashka Salomone: Fulton Reek Other Clinician: Referring Dannia Snook: Fulton Reek Treating Kolbey Teichert/Extender: Skipper Cliche in Treatment: 2 Edema Assessment Assessed: [Left: Yes] [Right: No] Edema: [Left: Ye] [Right: s] Calf Left: Right: Point of Measurement: From Medial Instep 41.5 cm Ankle Left: Right: Point of Measurement: From Medial Instep 24.5 cm Vascular Assessment Pulses: Dorsalis Pedis Palpable: [Left:Yes] Electronic Signature(s) Signed: 07/03/2022 11:29:22 AM By: [name], BSN, RN, CWS, [name] RN, BSN Entered By: [name], BSN, RN, CWS, [name] on 07/03/2022 09:39:13 [name] ([id number]) -------------------------------------------------------------------------------- Multi Wound Chart Details Patient Name: [name]. Date of Service: 07/03/2022 9:15 AM Medical Record Number: [id number] Patient Account Number: [id number] Date of Birth/Sex: 03/13/1946 (76 y.o. 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: Pressure Ulcer Pressure Ulcer N/A Comorbid History: Cataracts, Arrhythmia, Coronary Cataracts, Arrhythmia, Coronary N/A Artery Disease, Hypertension, Type Artery Disease, Hypertension, Type II Diabetes, Osteoarthritis, II Diabetes, Osteoarthritis, Neuropathy Neuropathy Date Acquired: 06/05/2022 06/05/2022 N/A Weeks of Treatment: 2 2 N/A Wound Status: Open Open N/A Wound Recurrence: No No N/A Measurements L x W x D (cm) 1x2.4x0.2 0.6x0.3x0.1 N/A Area (cm) : 1.885 0.141 N/A Volume (cm) : 0.377 0.014 N/A % Reduction in Area: -33.30% -123.80% N/A %  Reduction in Volume: -33.20% -133.30% N/A Classification: Grade 1 Grade 1 N/A Exudate Amount: Medium Large N/A Exudate Type: Serous Sanguinous N/A Exudate Color: amber red N/A Wound Margin: Flat and Intact Flat and Intact N/A Granulation Amount: None Present (0%) None Present (0%) N/A Necrotic Amount: Large (67-100%) Large (67-100%) N/A Necrotic Tissue: Adherent [place] N/A Exposed Structures: Fat Layer (Subcutaneous Tissue): Fat Layer (Subcutaneous Tissue): N/A Yes Yes Fascia: No Fascia: No Tendon: No Tendon: No Muscle: No Muscle: No Joint: No Joint: No Bone: No Bone: No Epithelialization: None None N/A Treatment Notes Electronic Signature(s) Signed: 07/03/2022 11:29:22 AM By: [name], BSN, RN, CWS, [name] RN, BSN [place], Empire ([id number]) Entered By: [name], BSN, RN, CWS, [name] on 07/03/2022 [place], Barnes ([id number]) -------------------------------------------------------------------------------- Multi-Disciplinary Care Plan Details Patient Name: [name]. Date of Service: 07/03/2022 9:15 AM Medical Record Number: [id number] Patient Account Number: [id number] Date of  Birth/Sex: [birth year] (76 y.o. M) Treating RN: [name] Primary Care Rahm Minix: Fulton Reek Other Clinician: Referring Alura Olveda: Fulton Reek Treating Marieke Lubke/Extender: Skipper Cliche in Treatment: 2 Active Inactive Orientation to the Wound Care Program Nursing Diagnoses: Knowledge deficit related to the wound healing center program Goals: Patient/caregiver will verbalize understanding of the [facility] Date Initiated: 06/19/2022 Target Resolution Date: 06/19/2022 Goal Status: Active Interventions: Provide education on orientation to the wound center Notes: Peripheral Neuropathy Nursing Diagnoses: Knowledge deficit related to disease process and management of peripheral neurovascular dysfunction Potential alteration in peripheral tissue perfusion (select prior to confirmation of diagnosis) Goals: Patient/caregiver will verbalize understanding of disease process and disease management Date Initiated: 06/19/2022 Target Resolution Date: 06/19/2022 Goal Status: Active Interventions: Assess signs and symptoms of neuropathy upon admission and as needed Provide education on Management of Neuropathy and Related Ulcers Provide education on Management of Neuropathy upon discharge from the [facility] Notes: Pressure Nursing Diagnoses: Knowledge deficit related to causes and risk factors for pressure ulcer development Knowledge deficit related to management of pressures ulcers Potential for impaired tissue integrity related to pressure, friction, moisture, and shear Goals: Patient will remain free from development of additional pressure ulcers Date Initiated: 06/19/2022 Target Resolution Date: 06/19/2022 Goal Status: Active Patient/caregiver will verbalize risk factors for pressure ulcer development Date Initiated: 06/19/2022 Target Resolution Date: 06/19/2022 Goal Status: Active Patient/caregiver will verbalize understanding of pressure ulcer management Date  Initiated: 06/19/2022 Target Resolution Date: 06/19/2022 Goal Status: Active Interventions: Assess: immobility, friction, shearing, incontinence upon admission and as needed Assess offloading mechanisms upon admission and as needed THAXTON, PELLEY ([id number]) Assess potential for pressure ulcer upon admission and as needed Provide education on pressure ulcers Notes: Wound/Skin Impairment Nursing Diagnoses: Impaired tissue integrity Goals: Patient/caregiver will verbalize understanding of skin care regimen Date Initiated: 06/19/2022 Target Resolution Date: 06/19/2022 Goal Status: Active Ulcer/skin breakdown will have a volume reduction of 30% by week 4 Date Initiated: 06/19/2022 Target Resolution Date: 07/17/2022 Goal Status: Active Interventions: Assess patient/caregiver ability to obtain necessary supplies Assess patient/caregiver ability to perform ulcer/skin care regimen upon admission and as needed Assess ulceration(s) every visit Provide education on ulcer and skin care Treatment Activities: Skin care regimen initiated : 06/19/2022 Topical wound management initiated : 06/19/2022 Notes: Electronic Signature(s) Signed: 07/03/2022 11:29:22 AM By: [name], BSN, RN, CWS, [name] RN, BSN Entered By: [name], BSN, RN, CWS, [name] on 07/03/2022 09:41:00 [name] ([id number]) -------------------------------------------------------------------------------- Pain Assessment Details Patient Name: RADAMES, MEJORADO. Date of Service: 07/03/2022 9:15 AM Medical Record Number: [id number] Patient Account Number: [id number] Date of  Birth/Sex: Nov 09, 1945 (76 y.o. M) Treating RN: [name] Primary Care Ladashia Demarinis: Fulton Reek Other Clinician: Referring Jacek Colson: Fulton Reek Treating Arya Luttrull/Extender: Skipper Cliche in Treatment: 2 Active Problems Location of Pain Severity and Description of Pain Patient Has Paino No Site Locations Pain Management and Medication Current Pain  Management: Notes PAtient denies pain at this time. Electronic Signature(s) Signed: 07/03/2022 11:29:22 AM By: [name], BSN, RN, CWS, [name] RN, BSN Entered By: [name], BSN, RN, CWS, [name] on 07/03/2022 09:25:10 MELQUISEDEC, JOURNEY ([id number]) -------------------------------------------------------------------------------- Wound Assessment Details Patient Name: ARTY, LANTZY. Date of Service: 07/03/2022 9:15 AM Medical Record Number: [id number] Patient Account Number: [id number] Date of Birth/Sex: 1946-06-21 (76 y.o. M) Treating RN: [name] Primary Care Lachrista Heslin: Fulton Reek Other Clinician: Referring Deshannon Seide: Fulton Reek Treating Abishai Viegas/Extender: Skipper Cliche in Treatment: 2 Wound Status Wound Number: 10 Primary Diabetic Wound/Ulcer of the Lower Extremity Etiology: Wound Location: Left Calcaneus Secondary Pressure Ulcer Wounding Event: Gradually Appeared Etiology: Date Acquired: 06/05/2022 Wound Open Weeks Of Treatment: 2 Status: Clustered Wound: No Comorbid Cataracts, Arrhythmia, Coronary Artery Disease, History: Hypertension, Type II Diabetes, Osteoarthritis, Neuropathy Photos Wound Measurements Length: (cm) 1 % Reduct Width: (cm) 2.4 % Reduct Depth: (cm) 0.2 Epitheli Area: (cm) 1.885 Tunneli Volume: (cm) 0.377 Undermi ion in Area: -33.3% ion in Volume: -33.2% alization: None ng: No ning: No Wound Description Classification: Grade 1 Foul Odo Wound Margin: Flat and Intact Slough/F Exudate Amount: Medium Exudate Type: Serous Exudate Color: amber r After Cleansing: No ibrino Yes Wound Bed Granulation Amount: None Present (0%) Exposed Structure Necrotic Amount: Large (67-100%) Fascia Exposed: No Necrotic Quality: Adherent Slough Fat Layer (Subcutaneous Tissue) Exposed: Yes Tendon Exposed: No Muscle Exposed: No Joint Exposed: No Bone Exposed: No Treatment Notes Wound #10 (Calcaneus) Wound Laterality: Left Cleanser Peri-Wound Care JAYLAN, HINOJOSA ([id number]) Topical Primary Dressing Prisma 4.34 (in) Discharge Instruction: Moisten w/normal saline or sterile water; Cover wound as directed. Do not remove from wound bed. Secondary Dressing ABD Pad 5x9 (in/in) Discharge Instruction: Cover with ABD pad Secured With Kerlix Roll Sterile or Non-Sterile 6-ply 4.5x4 (yd/yd) Discharge Instruction: Apply Kerlix as directed Compression Wrap Compression Stockings Add-Ons Electronic Signature(s) Signed: 07/03/2022 11:29:22 AM By: [name], BSN, RN, CWS, [name] RN, BSN Entered By: [name], BSN, RN, CWS, [name] on 07/03/2022 09:32:54 [name] ([id number]) -------------------------------------------------------------------------------- Wound Assessment Details Patient Name: DIYARI, CHERNE. Date of Service: 07/03/2022 9:15 AM Medical Record Number: [id number] Patient Account Number: [id number] Date of Birth/Sex: 30-Oct-1945 (76 y.o. M) Treating RN: [name] Primary Care Balian Schaller: Fulton Reek Other Clinician: Referring Melvinia Ashby: Fulton Reek Treating Shameca Landen/Extender: Skipper Cliche in Treatment: 2 Wound Status Wound Number: 11 Primary Diabetic Wound/Ulcer of the Lower Extremity Etiology: Wound Location: Left, Midline, Plantar Calcaneus Secondary Pressure Ulcer Wounding Event: Gradually Appeared Etiology: Date Acquired: 06/05/2022 Wound Open Weeks Of Treatment: 2 Status: Clustered Wound: No Comorbid Cataracts, Arrhythmia, Coronary Artery Disease, History: Hypertension, Type II Diabetes, Osteoarthritis, Neuropathy Photos Wound Measurements Length: (cm) 0 Width: (cm) 0 Depth: (cm) 0 Area: (cm) 0 Volume: (cm) 0 % Reduction in Area: 100% % Reduction in Volume: 100% Epithelialization: None Tunneling: No Undermining: No Wound Description Classification: Grade 1 Wound Margin: Flat and Intact Exudate Amount: Large Exudate Type: Sanguinous Exudate Color: red Foul Odor After Cleansing: No Slough/Fibrino  No Wound Bed Granulation Amount: None Present (0%) Exposed Structure Necrotic Amount: Large (67-100%) Fascia Exposed: No Necrotic Quality: Eschar Fat Layer (Subcutaneous Tissue) Exposed: Yes Tendon Exposed: No Muscle Exposed: No Joint Exposed: No Bone Exposed:  No Electronic Signature(s) Signed: 07/03/2022 11:29:22 AM By: [name], BSN, RN, CWS, [name] RN, BSN Entered By: [name], BSN, RN, CWS, [name] on 07/03/2022 09:46:23 [name] ([id number]) -------------------------------------------------------------------------------- Vitals Details Patient Name: KAMILO, OCH. Date of Service: 07/03/2022 9:15 AM Medical Record Number: [id number] Patient Account Number: [id number] Date of Birth/Sex: 1946/04/07 (76 y.o. M) Treating RN: [name] Primary Care Miriana Gaertner: Fulton Reek Other Clinician: Referring Hallelujah Wysong: Fulton Reek Treating Rael Yo/Extender: Skipper Cliche in Treatment: 2 Vital Signs Time Taken: 09:24 Temperature (F): 98.3 Height (in): 70 Pulse (bpm): 76 Weight (lbs): 265 Respiratory Rate (breaths/min): 18 Body Mass Index (BMI): 38 Blood Pressure (mmHg): 184/73 Reference Range: 80 - 120 mg / dl Electronic Signature(s) Signed: 07/03/2022 11:29:22 AM By: [name], BSN, RN, CWS, [name] RN, BSN Entered By: [name], BSN, RN, CWS, [name] on 07/03/2022 09:24:38

## 2022-07-03 NOTE — Progress Notes (Addendum)
SAKAI, WOLFORD ([id number]) Visit Report for 07/03/2022 Chief Complaint Document Details Patient Name: [name], [name]. Date of Service: 07/03/2022 9:15 AM Medical Record Number: [id number] Patient Account Number: [id number] Date of Birth/Sex: [birth year] (76 y.o. M) Treating RN: [name] Primary Care Provider: Fulton Reek Other Clinician: Referring Provider: Fulton Reek Treating Provider/Extender: Skipper Cliche in Treatment: 2 Information Obtained from: Patient Chief Complaint Left heel ulcer Electronic Signature(s) Signed: 07/03/2022 9:25:39 AM By: Worthy Keeler PA-C Entered By: Worthy Keeler on 07/03/2022 09:25:39 [name], [name] ([id number]) -------------------------------------------------------------------------------- Debridement Details Patient Name: [name], [name]. Date of Service: 07/03/2022 9:15 AM Medical Record Number: [id number] Patient Account Number: [id number] Date of Birth/Sex: November 28, 1945 (76 y.o. M) Treating RN: [name] Primary Care Provider: Fulton Reek Other Clinician: Referring Provider: Fulton Reek Treating Provider/Extender: Skipper Cliche in Treatment: 2 Debridement Performed for Wound #10 Left Calcaneus Assessment: Performed By: Physician Tommie Sams., PA-C Debridement Type: Debridement Severity of Tissue Pre Debridement: Fat layer exposed Level of Consciousness (Pre- Awake and Alert procedure): Pre-procedure Verification/Time Out Yes - 09:41 Taken: Total Area Debrided (L x W): 1 (cm) x 2.4 (cm) = 2.4 (cm) Tissue and other material Viable, Non-Viable, Callus, Slough, Subcutaneous, Slough debrided: Level: Skin/Subcutaneous Tissue Debridement Description: Excisional Instrument: Curette Bleeding: Minimum Hemostasis Achieved: Pressure Response to Treatment: Procedure was tolerated well Level of Consciousness (Post- Awake and Alert procedure): Post Debridement Measurements of Total Wound Length: (cm)  1 Width: (cm) 2.4 Depth: (cm) 0.2 Volume: (cm) 0.377 Character of Wound/Ulcer Post Debridement: Stable Severity of Tissue Post Debridement: Fat layer exposed Post Procedure Diagnosis Same as Pre-procedure Electronic Signature(s) Signed: 07/03/2022 11:29:22 AM By: [name], BSN, RN, CWS, [name] RN, BSN Signed: 07/03/2022 1:48:50 PM By: Worthy Keeler PA-C Entered By: [name], BSN, RN, CWS, [name] on 07/03/2022 09:43:00 [name] ([id number]) -------------------------------------------------------------------------------- Debridement Details Patient Name: [name], [name]. Date of Service: 07/03/2022 9:15 AM Medical Record Number: [id number] Patient Account Number: [id number] Date of Birth/Sex: 1946-06-04 (76 y.o. M) Treating RN: [name] Primary Care Provider: Fulton Reek Other Clinician: Referring Provider: Fulton Reek Treating Provider/Extender: Skipper Cliche in Treatment: 2 Debridement Performed for Wound #11 Left,Midline,Plantar Calcaneus Assessment: Performed By: Physician Tommie Sams., PA-C Debridement Type: Debridement Severity of Tissue Pre Debridement: Limited to breakdown of skin Level of Consciousness (Pre- Awake and Alert procedure): Pre-procedure Verification/Time Out Yes - 09:41 Taken: Total Area Debrided (L x W): 0.6 (cm) x 0.3 (cm) = 0.18 (cm) Tissue and other material Viable, Non-Viable, Callus, Slough, Subcutaneous, Slough debrided: Level: Skin/Subcutaneous Tissue Debridement Description: Excisional Instrument: Curette Bleeding: Minimum Hemostasis Achieved: Pressure Response to Treatment: Procedure was tolerated well Level of Consciousness (Post- Awake and Alert procedure): Post Debridement Measurements of Total Wound Length: (cm) 0.6 Width: (cm) 0.3 Depth: (cm) 0.2 Volume: (cm) 0.028 Character of Wound/Ulcer Post Debridement: Stable Severity of Tissue Post Debridement: Limited to breakdown of skin Post Procedure Diagnosis Same as  Pre-procedure Electronic Signature(s) Signed: 07/03/2022 11:29:22 AM By: [name], BSN, RN, CWS, [name] RN, BSN Signed: 07/03/2022 1:48:50 PM By: Worthy Keeler PA-C Entered By: [name], BSN, RN, CWS, [name] on 07/03/2022 09:45:17 [name] ([id number]) -------------------------------------------------------------------------------- HPI Details Patient Name: [name], [name]. Date of Service: 07/03/2022 9:15 AM Medical Record Number: [id number] Patient Account Number: [id number] Date of Birth/Sex: 03-Jul-1946 (76 y.o. M) Treating RN: [name] Primary Care Provider: Fulton Reek Other Clinician: Referring Provider: Fulton Reek Treating Provider/Extender: Skipper Cliche in Treatment: 2 History of Present Illness HPI Description: 09/24/2020  on evaluation today patient presents today for a heel ulcer that he tells me has been present for about 2 years. He has been seeing podiatry and they have been attempting to manage this including what sounds to be a total contact cast, Unna boot, and just standard dressings otherwise as well. Most recently has been using triple antibiotic ointment. With that being said unfortunately despite everything he really has not had any significant improvement. He tells me that he cannot even really remember exactly how this began but he presumed it may have rubbed on his shoes or something of that nature. 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 He tells me that this does seem to help. Fortunately there is no signs of active infection at this time. His most recent hemoglobin A1c was 8.1 though he knows this was this year he cannot tell me the exact time. His fluid pills currently to  help with some of the lower extremity edema although he does obviously have signs of venous stasis/lymphedema. Currently there is no evidence of active infection. No fevers, chills, nausea, vomiting, or diarrhea. Patient has had fairly recent ABIs which were performed on 07/19/2020 and revealed that he has normal findings in both the ankle and toe locations bilaterally. His ABI on the right was 1.09 on the left was 1.08 with a TBI on the right of 0.88 and on the left of 0.94. Triphasic flow was noted throughout. 10/08/2020 on evaluation today patient appears to be doing pretty well in regard to his left heel currently in fact this is doing a great job and seems to be healing quite nicely. Unfortunately on his right leg he had a pile of wood that actually fell on him injuring his right leg this is somewhat erythematous has me concerned little bit about cellulitis though there is not really a good area to culture at this point. 10/24/2020 upon evaluation today patient appears to be doing well with regard to his heel ulcer. He is showing signs of improvement which is great news. His right leg is completely healed. Overall I feel like he is doing excellent and there is no signs of infection. 11/07/2020 upon evaluation today patient appears to be doing well with regard to his heel ulcer. He tells me that last week when he was unable to come in his wife actually thought that the wound was very close to closing if not closed. Then it began to "reopen again". I really feel like what may have happened as the collagen may have dried over the wound bed and that because that misunderstanding with thinking that the wound was healing. With that being said I did not see it last week I do not know that for certain. Either way I feel like he is doing great today I see no signs of infection at this point. 11/26/2020 upon inspection today patient appears to be doing decently well in regard to his heel ulcer. He has been  tolerating the dressing changes without complication. Fortunately there is no sign of active infection at this time. No fevers, chills, nausea, vomiting, or diarrhea. 12/10/2020 upon evaluation today patient appears to be doing fairly well in regard to the wound on his heel  as well as what appears to be a new wound of the left first metatarsal head plantar aspect. This seems to be an area that was callus that has split as the patient tells me has been trying to walk on his toes more has probably where this came from. With that being said there does not appear to be signs of active infection which is great news. 12/17/2020 upon evaluation today patient actually appears to be making good progress currently. Fortunately there is no evidence of active infection at this time. Overall I feel like he is very close to complete closure. 12/26/2020 upon evaluation today patient appears to be doing excellent in regard to his wounds. In fact I am not certain that these are not even completely healed on initial inspection. Overall I am very pleased with where things stand today. Good news is that they are healed he is actually get ready to go out of town and that will be helpful as well as he will be a full part of the time. Readmission: 02/04/2021 upon evaluation today patient appears to be doing well at this point in regard to his left heel that I previously saw him for. Unfortunately he is having issues with his right lower extremity. He has significant wounds at this point he also has erythema noted there is definite signs of cellulitis which is unfortunate. With that being said I think we do need to address this sooner rather than later. The good news is he did have a nice trip to the beach. He tells me that he had no issues during that time. 4/27; patient on Bactrim. Culture showed methicillin sensitive staph aureus therefore the Bactrim should be effective. 2 small areas on the right leg are healed the area on  the mid aspect of the tibia almost 100% covered in a very adherent necrotic debris. We have been using silver alginate 02/20/2021 upon evaluation today patient appears to be doing well with regard to his wound. He is showing signs of improvement which is great news overall very pleased with where things stand today. No fevers, chills, nausea, vomiting, or diarrhea. 02/27/2021 upon evaluation today patient appears to be doing well with regard to his leg ulcer. He is tolerating the dressing changes and overall appears to be doing quite excellent. I am extremely pleased with where things stand and overall I think patient is making great progress. There is no sign of active infection at this time which is also great news. 03/06/2021 upon evaluation today patient appears to be doing excellent in regard to his wounds. In fact he appears to be completely healed today based on what I am seeing. This is excellent news and overall I am extremely pleased with where he stands. Overall the patient is happy to hear this as well this has been quite sometime coming. Readmission: 04/01/2021 patient unfortunately returns for readmission today. He tells me that he has been wearing his compression socks on the right leg daily [name], [name] ([id number]) and he does not really know what is going on and why his legs are doing what they are doing. We did therefore go ahead and probe deeper into exactly what has been going on with him today. Subsequently the patient tells me that when he gets up and what we would call "first thing in the morning" are really 2 different things". He does not tend to sleep well so he tells me that he will often wake up at 3:00 in the morning.  He will then potentially going into the living room to get in his chair where he may read a book for a little while and then potentially fall asleep back in his chair. He then subsequently wake up around 5:00 or so and then get up and in his words "putter  around". This often will end with him proceeding at some point around 8 AM or 9 AM to putting on his compression socks. With that being said this means that anywhere from the 3:00 in the morning till roughly around 9:00 in the morning he has no compression on yet he is sitting in his recliner, walking around and up and about, and this is at least 5 to 6 hours of noncompressed time. That may be our issue here but I did not realize until we discussed this further today. Fortunately there does not appear to be any signs of active infection at this time which is great news. With that being said he has multiple wounds of the bilateral lower extremities. 04/10/2021 upon evaluation today patient appears to be doing well with regard to his legs for the most part. He does look like he had some injury where his wrap slid down nonetheless he did some "doctoring on them". I do feel like most of the areas honestly have cleared back up which is great news. There does not appear to be any signs of active infection at this time which is also great news. No fevers, chills, nausea, vomiting, or diarrhea. 04/18/2021 upon evaluation today patient appears to be doing well with regard to his wounds in fact he appears to be completely healed which is great news. Fortunately there does not appear to be any signs of active infection which is great news. No fevers, chills, nausea, vomiting, or diarrhea. READMISSION 07/28/2021 This is a now 76 year old man we have had in this clinic for quite a bit of this year. 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 He wears compression stockings. 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 He comes in an old running shoes although he says he wears better shoes at home. His history is that he noticed a blister in the right posterior heel. This open. He has been applying Neosporin  to it currently the wound measures 2 x 1.4 cm. 100% slough covered. Not really offloading this in any rigorous way. Past medical history is essentially unchanged he has paroxysmal atrial fibrillation on Coumadin type 2 diabetes with a recent hemoglobin A1c of 7 on metformin. ABI in our clinic was 0.98 on the right 08/05/2021 upon evaluation today patient appears to be doing well with regard to his heel ulcer. Fortunately there does not appear to be any signs of active infection at this time. Overall I been very pleased with where things seem to be currently as far as the wound healing is concerned. Again this is first a lot of seeing him Dr. Dellia Nims readmitted him last week. Nonetheless I think we are definitely making some progress here. 08/11/2021 upon evaluation today patient's wound on the heel actually showing signs of excellent improvement. I am actually very pleased with where things stand currently. No fevers, chills, nausea, vomiting, or diarrhea. There does not appear to be any need for sharp debridement today either which is also great news. 08/25/2021 upon evaluation today patient appears to be doing well with regard to his heel ulcer. This is actually looking  significantly improved compared to last time I saw him. Fortunately there does not appear to be any evidence of active infection at this time. No fevers, chills, nausea, vomiting, or diarrhea. 09/01/2021 upon evaluation today patient appears to be doing decently well in regard to his wounds. Fortunately there does not appear to be any signs of active infection at this time which is great news and overall very pleased in that regard. I do not see any evidence of active infection systemically which is great news as well. No fevers, chills, nausea, vomiting, or diarrhea. I think that the heel is making excellent progress. 09/15/2021 upon evaluation today patient's heel unfortunately is significantly worse compared to what it was  previous. I do believe that at this time he would benefit from switching back to something a little bit more offloading from the cushion shoe he has right now this is more of a heel protector what I really need is a Prevalon offloading boot which I think is good to do a lot better for him than just the small heel protector. 09/23/2021 upon evaluation today patient appears to be doing a little better in regards to last week's visit. Overall I think that he is making good progress which is great news and there does not appear to be any evidence of active infection at this time. No fevers, chills, nausea, vomiting, or diarrhea. 09/30/2021 upon evaluation patient's heel is actually showing signs of good improvement which is great news. Fortunately there does not appear to be any evidence of active infection locally nor systemically at this point. No fevers, chills, nausea, vomiting, or diarrhea. 10/07/2021 upon evaluation today patient appears to be doing well currently in regard to his heel ulcer. He has been tolerating the dressing changes without complication. Fortunately I do not see any signs of active infection at this time which is great news. No fevers, chills, nausea, vomiting, or diarrhea. 12/27; wound is measuring smaller. We have been using silver alginate 10/28/2021 upon evaluation today patient appears to be doing well currently in regard to his heel ulcer. I am actually very pleased with where things stand and I think he is making excellent progress. Fortunately I do not see any signs of active infection locally nor systemically at this point which is great news. Nonetheless I do believe that the patient would benefit from a new offloading shoe and has been using it Velcro is no longer functioning properly and he tells me that he almost tripped and fell because of that today. Obviously I do not want him following that would be very bad. 11/11/2021 upon evaluation today patient appears to be  doing excellent in regard to his wound. In fact this appears to be completely healed based on what I see currently. I do not see any signs of anything open or draining and I did double check just by clearing away some of the callus around the edges of the wound to ensure that there was nothing still open or hiding underneath. Once this was cleared away it appeared that the patient was doing significantly better at this time which is great news and there was nothing actually open. Readmission: [name], [name] ([id number]) 06-19-2022 upon evaluation today patient appears to be doing well currently in regard to his heel ulcer all things considered. He unfortunately is having a bit of breakdown in general as far as the heel is concerned not nearly as bad as what we noted last time he was here in the clinic.  The good news is I do think that this should hopefully heal much more rapidly than what we noted last time. His past medical history has not changed he is on Coumadin. 07-03-2022 upon evaluation today patient appears to be doing better in regard to his heel. We will start to see some improvement here which is good news. Fortunately I do not see any evidence of infection locally or systemically at this time which is great news. No fevers, chills, nausea, vomiting, or diarrhea. Electronic Signature(s) Signed: 07/03/2022 9:52:06 AM By: Worthy Keeler PA-C Entered By: Worthy Keeler on 07/03/2022 09:52:06 BRYCEN, BEAN ([id number]) -------------------------------------------------------------------------------- Physical Exam Details Patient Name: [name], [name] Date of Service: 07/03/2022 9:15 AM Medical Record Number: [id number] Patient Account Number: [id number] Date of Birth/Sex: [birth year] (76 y.o. M) Treating RN: [name] Primary Care Provider: Fulton Reek Other Clinician: Referring Provider: Fulton Reek Treating Provider/Extender: Skipper Cliche in Treatment:  2 Constitutional Well-nourished and well-hydrated in no acute distress. Respiratory normal breathing without difficulty. Psychiatric this patient is able to make decisions and demonstrates good insight into disease process. Alert and Oriented x 3. pleasant and cooperative. Notes Upon inspection patient's wound bed showed evidence of good granulation and epithelization at this point. Fortunately I see no signs of active infection which is great news and overall I am extremely pleased I did perform debridement clearway some of the necrotic debris tolerated that without complication and postdebridement the wound bed appears to be doing much better. Electronic Signature(s) Signed: 07/03/2022 9:52:27 AM By: Worthy Keeler PA-C Entered By: Worthy Keeler on 07/03/2022 09:52:27 [name] ([id number]) -------------------------------------------------------------------------------- Physician Orders Details Patient Name: [name], [name]. Date of Service: 07/03/2022 9:15 AM Medical Record Number: [id number] Patient Account Number: [id number] Date of Birth/Sex: 11/11/1945 (76 y.o. 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 Bathing/ Shower/ Hygiene o May shower; gently cleanse wound with antibacterial soap, rinse and pat dry prior to dressing wounds Non-Wound Condition o Additional non-wound orders/instructions: - Apply 40% Urea cream to dry cracked skin on feet. Off-Loading o Open toe surgical  shoe with peg assist. Wound Treatment Wound #10 - Calcaneus Wound Laterality: Left Primary Dressing: Prisma 4.34 (in) (Generic) 3 x Per Week/30 Days Discharge Instructions: Moisten w/normal saline or sterile water; Cover wound as directed. Do not remove from wound bed. Secondary Dressing: ABD Pad 5x9 (in/in) (Generic) 3 x Per Week/30 Days Discharge Instructions: Cover with ABD pad Secured With: Kerlix Roll Sterile or Non-Sterile 6-ply 4.5x4 (yd/yd) 3 x Per Week/30 Days Discharge Instructions: Apply Kerlix as directed Electronic Signature(s) Signed: 07/03/2022 11:29:22 AM By: [name], BSN, RN, CWS, [name] RN, BSN Signed: 07/03/2022 1:48:50 PM By: Worthy Keeler PA-C Entered By: [name], BSN, RN, CWS, [name] on 07/03/2022 09:53:40 [name] ([id number]) -------------------------------------------------------------------------------- Problem List Details Patient Name: [name], [name]. Date of Service: 07/03/2022 9:15 AM Medical Record Number: [id number] Patient Account Number: [id number] Date of Birth/Sex: [birth year] (76 y.o. M) Treating RN: [name] Primary Care Provider: Fulton Reek Other Clinician: Referring Provider: Fulton Reek Treating Provider/Extender: Skipper Cliche in Treatment: 2  Active Problems ICD-10 Encounter Code Description Active Date MDM Diagnosis E11.621 Type 2 diabetes mellitus with foot ulcer 06/19/2022 No Yes L97.522 Non-pressure chronic ulcer of other part of left foot with fat layer 06/19/2022 No Yes exposed E11.42 Type 2 diabetes mellitus with diabetic polyneuropathy 06/19/2022 No Yes Inactive Problems Resolved Problems Electronic Signature(s) Signed: 07/03/2022 9:25:36 AM By: Worthy Keeler PA-C Entered By: Worthy Keeler on 07/03/2022 09:25:36 [name] ([id number]) -------------------------------------------------------------------------------- Progress Note Details Patient Name: [name]. Date of Service: 07/03/2022 9:15  AM Medical Record Number: [id number] Patient Account Number: [id number] Date of Birth/Sex: 03-27-1946 (76 y.o. M) Treating RN: [name] Primary Care Provider: Fulton Reek Other Clinician: Referring Provider: Fulton Reek Treating Provider/Extender: Skipper Cliche in Treatment: 2 Subjective Chief Complaint Information obtained from Patient Left heel ulcer History of Present Illness (HPI) 09/24/2020 on evaluation today patient presents today for a heel ulcer that he tells me has been present for about 2 years. He has been seeing podiatry and they have been attempting to manage this including what sounds to be a total contact cast, Unna boot, and just standard dressings otherwise as well. Most recently has been using triple antibiotic ointment. With that being said unfortunately despite everything he really has not had any significant improvement. He tells me that he cannot even really remember exactly how this began but he presumed it may have rubbed on his shoes or something of that nature. 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 He tells me that this does seem to help. Fortunately there is no signs of active infection at this time. His most recent hemoglobin A1c was 8.1 though he knows this was this year he cannot tell me the exact time. His fluid pills currently to help with some of the lower extremity edema although he does obviously have signs of venous stasis/lymphedema. Currently there is no evidence of active infection. No fevers, chills, nausea, vomiting, or diarrhea. Patient has had fairly recent ABIs which were performed on 07/19/2020 and revealed that he has normal findings in both the ankle and  toe locations bilaterally. His ABI on the right was 1.09 on the left was 1.08 with a TBI on the right of 0.88 and on the left of 0.94. Triphasic flow was noted throughout. 10/08/2020 on evaluation today patient appears to be doing pretty well in regard to his left heel currently in fact this is doing a great job and seems to be healing quite nicely. Unfortunately on his right leg he had a pile of wood that actually fell on him injuring his right leg this is somewhat erythematous has me concerned little bit about cellulitis though there is not really a good area to culture at this point. 10/24/2020 upon evaluation today patient appears to be doing well with regard to his heel ulcer. He is showing signs of improvement which is great news. His right leg is completely healed. Overall I feel like he is doing excellent and there is no signs of infection. 11/07/2020 upon evaluation today patient appears to be doing well with regard to his heel ulcer. He tells me that last week when he was unable to come in his wife actually thought that the  wound was very close to closing if not closed. Then it began to "reopen again". I really feel like what may have happened as the collagen may have dried over the wound bed and that because that misunderstanding with thinking that the wound was healing. With that being said I did not see it last week I do not know that for certain. Either way I feel like he is doing great today I see no signs of infection at this point. 11/26/2020 upon inspection today patient appears to be doing decently well in regard to his heel ulcer. He has been tolerating the dressing changes without complication. Fortunately there is no sign of active infection at this time. No fevers, chills, nausea, vomiting, or diarrhea. 12/10/2020 upon evaluation today patient appears to be doing fairly well in regard to the wound on his heel as well as what appears to be a new wound of the left first metatarsal head  plantar aspect. This seems to be an area that was callus that has split as the patient tells me has been trying to walk on his toes more has probably where this came from. With that being said there does not appear to be signs of active infection which is great news. 12/17/2020 upon evaluation today patient actually appears to be making good progress currently. Fortunately there is no evidence of active infection at this time. Overall I feel like he is very close to complete closure. 12/26/2020 upon evaluation today patient appears to be doing excellent in regard to his wounds. In fact I am not certain that these are not even completely healed on initial inspection. Overall I am very pleased with where things stand today. Good news is that they are healed he is actually get ready to go out of town and that will be helpful as well as he will be a full part of the time. Readmission: 02/04/2021 upon evaluation today patient appears to be doing well at this point in regard to his left heel that I previously saw him for. Unfortunately he is having issues with his right lower extremity. He has significant wounds at this point he also has erythema noted there is definite signs of cellulitis which is unfortunate. With that being said I think we do need to address this sooner rather than later. The good news is he did have a nice trip to the beach. He tells me that he had no issues during that time. 4/27; patient on Bactrim. Culture showed methicillin sensitive staph aureus therefore the Bactrim should be effective. 2 small areas on the right leg are healed the area on the mid aspect of the tibia almost 100% covered in a very adherent necrotic debris. We have been using silver alginate 02/20/2021 upon evaluation today patient appears to be doing well with regard to his wound. He is showing signs of improvement which is great news overall very pleased with where things stand today. No fevers, chills, nausea,  vomiting, or diarrhea. 02/27/2021 upon evaluation today patient appears to be doing well with regard to his leg ulcer. He is tolerating the dressing changes and overall appears to be doing quite excellent. I am extremely pleased with where things stand and overall I think patient is making great progress. There is no sign of active infection at this time which is also great news. 03/06/2021 upon evaluation today patient appears to be doing excellent in regard to his wounds. In fact he appears to be completely healed today based on what  I am seeing. This is excellent news and overall I am extremely pleased with where he stands. Overall the patient is happy to hear [name], [name] ([id number]) this as well this has been quite sometime coming. Readmission: 04/01/2021 patient unfortunately returns for readmission today. He tells me that he has been wearing his compression socks on the right leg daily and he does not really know what is going on and why his legs are doing what they are doing. We did therefore go ahead and probe deeper into exactly what has been going on with him today. Subsequently the patient tells me that when he gets up and what we would call "first thing in the morning" are really 2 different things". He does not tend to sleep well so he tells me that he will often wake up at 3:00 in the morning. He will then potentially going into the living room to get in his chair where he may read a book for a little while and then potentially fall asleep back in his chair. He then subsequently wake up around 5:00 or so and then get up and in his words "putter around". This often will end with him proceeding at some point around 8 AM or 9 AM to putting on his compression socks. With that being said this means that anywhere from the 3:00 in the morning till roughly around 9:00 in the morning he has no compression on yet he is sitting in his recliner, walking around and up and about, and this is at  least 5 to 6 hours of noncompressed time. That may be our issue here but I did not realize until we discussed this further today. Fortunately there does not appear to be any signs of active infection at this time which is great news. With that being said he has multiple wounds of the bilateral lower extremities. 04/10/2021 upon evaluation today patient appears to be doing well with regard to his legs for the most part. He does look like he had some injury where his wrap slid down nonetheless he did some "doctoring on them". I do feel like most of the areas honestly have cleared back up which is great news. There does not appear to be any signs of active infection at this time which is also great news. No fevers, chills, nausea, vomiting, or diarrhea. 04/18/2021 upon evaluation today patient appears to be doing well with regard to his wounds in fact he appears to be completely healed which is great news. Fortunately there does not appear to be any signs of active infection which is great news. No fevers, chills, nausea, vomiting, or diarrhea. READMISSION 07/28/2021 This is a now 76 year old man we have had in this clinic for quite a bit of this year. 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 He wears compression stockings. 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 He comes in an old running shoes although he says he wears better shoes at home. His history is that he noticed a blister in the right posterior heel. This open. He has been applying Neosporin to it currently the wound measures 2 x 1.4 cm. 100% slough covered. Not really offloading this in any rigorous way. Past medical history is essentially unchanged he has paroxysmal atrial fibrillation on Coumadin type 2 diabetes with a recent hemoglobin A1c of 7 on metformin. ABI in our clinic was 0.98 on the right 08/05/2021  upon evaluation  today patient appears to be doing well with regard to his heel ulcer. Fortunately there does not appear to be any signs of active infection at this time. Overall I been very pleased with where things seem to be currently as far as the wound healing is concerned. Again this is first a lot of seeing him Dr. Dellia Nims readmitted him last week. Nonetheless I think we are definitely making some progress here. 08/11/2021 upon evaluation today patient's wound on the heel actually showing signs of excellent improvement. I am actually very pleased with where things stand currently. No fevers, chills, nausea, vomiting, or diarrhea. There does not appear to be any need for sharp debridement today either which is also great news. 08/25/2021 upon evaluation today patient appears to be doing well with regard to his heel ulcer. This is actually looking significantly improved compared to last time I saw him. Fortunately there does not appear to be any evidence of active infection at this time. No fevers, chills, nausea, vomiting, or diarrhea. 09/01/2021 upon evaluation today patient appears to be doing decently well in regard to his wounds. Fortunately there does not appear to be any signs of active infection at this time which is great news and overall very pleased in that regard. I do not see any evidence of active infection systemically which is great news as well. No fevers, chills, nausea, vomiting, or diarrhea. I think that the heel is making excellent progress. 09/15/2021 upon evaluation today patient's heel unfortunately is significantly worse compared to what it was previous. I do believe that at this time he would benefit from switching back to something a little bit more offloading from the cushion shoe he has right now this is more of a heel protector what I really need is a Prevalon offloading boot which I think is good to do a lot better for him than just the small heel protector. 09/23/2021 upon evaluation  today patient appears to be doing a little better in regards to last week's visit. Overall I think that he is making good progress which is great news and there does not appear to be any evidence of active infection at this time. No fevers, chills, nausea, vomiting, or diarrhea. 09/30/2021 upon evaluation patient's heel is actually showing signs of good improvement which is great news. Fortunately there does not appear to be any evidence of active infection locally nor systemically at this point. No fevers, chills, nausea, vomiting, or diarrhea. 10/07/2021 upon evaluation today patient appears to be doing well currently in regard to his heel ulcer. He has been tolerating the dressing changes without complication. Fortunately I do not see any signs of active infection at this time which is great news. No fevers, chills, nausea, vomiting, or diarrhea. 12/27; wound is measuring smaller. We have been using silver alginate 10/28/2021 upon evaluation today patient appears to be doing well currently in regard to his heel ulcer. I am actually very pleased with where things stand and I think he is making excellent progress. Fortunately I do not see any signs of active infection locally nor systemically at this point which is great news. Nonetheless I do believe that the patient would benefit from a new offloading shoe and has been using it Velcro is no longer functioning properly and he tells me that he almost tripped and fell because of that today. Obviously I do not want him following that would be very bad. 11/11/2021 upon evaluation today patient appears to be doing excellent  in regard to his wound. In fact this appears to be completely healed based on what I see currently. I do not see any signs of anything open or draining and I did double check just by clearing away some of the callus [name], [name]. ([id number]) around the edges of the wound to ensure that there was nothing still open or hiding  underneath. Once this was cleared away it appeared that the patient was doing significantly better at this time which is great news and there was nothing actually open. Readmission: 06-19-2022 upon evaluation today patient appears to be doing well currently in regard to his heel ulcer all things considered. He unfortunately is having a bit of breakdown in general as far as the heel is concerned not nearly as bad as what we noted last time he was here in the clinic. The good news is I do think that this should hopefully heal much more rapidly than what we noted last time. His past medical history has not changed he is on Coumadin. 07-03-2022 upon evaluation today patient appears to be doing better in regard to his heel. We will start to see some improvement here which is good news. Fortunately I do not see any evidence of infection locally or systemically at this time which is great news. No fevers, chills, nausea, vomiting, or diarrhea. Objective Constitutional Well-nourished and well-hydrated in no acute distress. Vitals Time Taken: 9:24 AM, Height: 70 in, Weight: 265 lbs, BMI: 38, Temperature: 98.3 F, Pulse: 76 bpm, Respiratory Rate: 18 breaths/min, Blood Pressure: 184/73 mmHg. Respiratory normal breathing without difficulty. Psychiatric this patient is able to make decisions and demonstrates good insight into disease process. Alert and Oriented x 3. pleasant and cooperative. General Notes: Upon inspection patient's wound bed showed evidence of good granulation and epithelization at this point. Fortunately I see no signs of active infection which is great news and overall I am extremely pleased I did perform debridement clearway some of the necrotic debris tolerated that without complication and postdebridement the wound bed appears to be doing much better. Integumentary (Hair, Skin) Wound #10 status is Open. Original cause of wound was Gradually Appeared. The date acquired was: 06/05/2022.  The wound has been in treatment 2 weeks. The wound is located on the Left Calcaneus. The wound measures 1cm length x 2.4cm width x 0.2cm depth; 1.885cm^2 area and 0.377cm^3 volume. There is Fat Layer (Subcutaneous Tissue) exposed. There is no tunneling or undermining noted. There is a medium amount of serous drainage noted. The wound margin is flat and intact. There is no granulation within the wound bed. There is a large (67-100%) amount of necrotic tissue within the wound bed including Adherent Slough. Wound #11 status is Open. Original cause of wound was Gradually Appeared. The date acquired was: 06/05/2022. The wound has been in treatment 2 weeks. The wound is located on the Left,Midline,Plantar Calcaneus. The wound measures 0cm length x 0cm width x 0cm depth; 0cm^2 area and 0cm^3 volume. There is Fat Layer (Subcutaneous Tissue) exposed. There is no tunneling or undermining noted. There is a large amount of sanguinous drainage noted. The wound margin is flat and intact. There is no granulation within the wound bed. There is a large (67- 100%) amount of necrotic tissue within the wound bed including Eschar. 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 There was a Excisional Skin/Subcutaneous Tissue Debridement with a total area of 2.4 sq cm performed by Tommie Sams., PA-C. With the following instrument(s): Curette to remove Viable and Non-Viable tissue/material. Material removed includes Callus, Subcutaneous Tissue, and Slough. No specimens were taken. A time out was conducted at 09:41, prior to the start of  the procedure. A Minimum amount of bleeding was controlled with Pressure. The procedure was tolerated well. Post Debridement Measurements: 1cm length x 2.4cm width x 0.2cm depth; 0.377cm^3 volume. Character of Wound/Ulcer Post Debridement is stable. Severity of Tissue Post Debridement is: Fat layer exposed. Post procedure Diagnosis Wound #10: Same as Pre-Procedure Wound #11 Pre-procedure diagnosis of Wound #11 is a Diabetic Wound/Ulcer of the Lower Extremity located on the Left,Midline,Plantar Calcaneus .Severity of Tissue Pre Debridement is: Limited to breakdown of skin. There was a Excisional Skin/Subcutaneous Tissue Debridement with a total area of 0.18 sq cm performed by Tommie Sams., PA-C. With the following instrument(s): Curette to remove Viable and Non-Viable tissue/material. Material removed includes Callus, Subcutaneous Tissue, and Slough. No specimens were taken. A time out was conducted at 09:41, prior to the start of the procedure. A Minimum amount of bleeding was controlled with Pressure. The procedure was tolerated well. Post Debridement Measurements: 0.6cm length x 0.3cm width x 0.2cm depth; 0.028cm^3 volume. Character of Wound/Ulcer Post Debridement is stable. Severity of Tissue Post Debridement is: Limited to breakdown of skin. Post procedure Diagnosis Wound #11: Same as Pre-Procedure Plan 1. I am good recommend that we go ahead and continue with the wound care measures as before and the patient is in agreement with plan this includes the use of the silver collagen which I think is doing a good job. 2. I am also can recommend that we have the patient use an ABD pad to cover roll gauze to secure in place. 3. I would also recommend that he get some 40% urea cream to use around the callus surrounding the wound obviously this would not go on the wound itself. With that being said I think this can definitely help to loosen up some of the callus and will get this looking much better.  He is in agreement with this plan. We will see patient back for reevaluation in 1 week here in the clinic. If anything worsens or changes patient will contact our office for additional recommendations. Electronic Signature(s) Signed: 07/03/2022 9:53:19 AM By: Worthy Keeler PA-C Entered By: Worthy Keeler on 07/03/2022 09:53:19 ZAKAR, BROSCH ([id number]) -------------------------------------------------------------------------------- SuperBill Details Patient Name: HERNANDO, REALI. Date of Service: 07/03/2022 Medical Record Number: [id number] Patient Account Number: [id number] Date of Birth/Sex: 07/26/1946 (76 y.o. M) Treating RN: [name] Primary Care Provider: Fulton Reek Other Clinician: Referring Provider: Fulton Reek Treating Provider/Extender: Skipper Cliche in Treatment: 2 Diagnosis Coding ICD-10 Codes Code Description [phone] Type 2 diabetes mellitus with foot ulcer 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sed E11.42 Type 2 diabetes mellitus with diabetic polyneuropathy Facility Procedures CPT4 Code: 96789381 Description: 11042 - DEB SUBQ TISSUE 20 SQ CM/< Modifier: Quantity: 1 CPT4 Code: Description: ICD-10 Diagnosis Description L97.522 Non-pressure chronic ulcer of other part of left foot with fat layer exp Modifier: osed Quantity: Physician Procedures CPT4 Code: [id number] Description: 11042 - WC PHYS SUBQ TISS 20 SQ CM  Modifier: Quantity: 1 CPT4 Code: Description: ICD-10 Diagnosis Description L97.522 Non-pressure chronic ulcer of other part of left foot with fat layer exp Modifier: osed Quantity: Electronic Signature(s) Signed: 07/03/2022 9:53:27 AM By: Worthy Keeler PA-C Entered By: Worthy Keeler on 07/03/2022 09:53:27

## 2022-07-10 ENCOUNTER — Encounter: Payer: Medicare HMO | Admitting: Physician Assistant

## 2022-07-10 DIAGNOSIS — E11621 Type 2 diabetes mellitus with foot ulcer: Secondary | ICD-10-CM | POA: Diagnosis not present

## 2022-07-10 NOTE — Progress Notes (Signed)
[name], [name] ([id number]) Visit Report for 07/10/2022 Chief Complaint Document Details Patient Name: [name], [name]. Date of Service: 07/10/2022 9:15 AM Medical Record Number: [id number] Patient Account Number: [id number] Date of Birth/Sex: 1945/11/06 (75 y.o. M) Treating RN: [name] Primary Care Provider: Fulton Reek Other Clinician: Referring Provider: Fulton Reek Treating Provider/Extender: Skipper Cliche in Treatment: 3 Information Obtained from: Patient Chief Complaint Left heel ulcer Electronic Signature(s) Signed: 07/10/2022 9:32:08 AM By: Worthy Keeler PA-C Entered By: Worthy Keeler on 07/10/2022 09:32:08 [name], [name] ([id number]) -------------------------------------------------------------------------------- Problem List Details Patient Name: [name], [name]. Date of Service: 07/10/2022 9:15 AM Medical Record Number: [id number] Patient Account Number: [id number] Date of Birth/Sex: 10-16-1946 (76 y.o. M) Treating RN: [name] Primary Care Provider: Fulton Reek Other Clinician: Referring Provider: Fulton Reek Treating Provider/Extender: Skipper Cliche in Treatment: 3 Active Problems ICD-10 Encounter Code Description Active Date MDM Diagnosis E11.621 Type 2 diabetes mellitus with foot ulcer 06/19/2022 No Yes L97.522 Non-pressure chronic ulcer of other part of left foot with fat layer 06/19/2022 No Yes exposed E11.42 Type 2 diabetes mellitus with diabetic polyneuropathy 06/19/2022 No Yes Inactive Problems Resolved Problems Electronic Signature(s) Signed: 07/10/2022 9:32:03 AM By: Worthy Keeler PA-C Entered By: Worthy Keeler on 07/10/2022 09:32:03

## 2022-07-13 ENCOUNTER — Ambulatory Visit: Payer: Medicare HMO | Admitting: Neurology

## 2022-07-13 NOTE — Progress Notes (Signed)
[name] ([id number]) Visit Report for 07/10/2022 Arrival Information Details Patient Name: [name], [name]. Date of Service: 07/10/2022 9:15 AM Medical Record Number: [id number] Patient Account Number: [id number] Date of Birth/Sex: [birth year] (76 y.o. M) Treating [name]: [name] Primary Care [name]: [name] Other Clinician: Referring [name]: [name] Treating [name]/Extender: [name] in Treatment: 3 Visit Information History Since Last Visit Added or deleted any medications: No Patient Arrived: Ambulatory Has Dressing in Place as Prescribed: Yes Arrival Time: 09:22 Pain Present Now: No Accompanied By: self Transfer Assistance: None Patient Identification Verified: Yes Secondary Verification Process Completed: Yes Patient Requires Transmission-Based No Precautions: Patient Has Alerts: Yes Patient Alerts: Patient on Blood Thinner Warfarin Type II Diabetic Electronic Signature(s) Signed: 07/13/2022 8:05:30 AM By: [name], BSN, [name], CWS, [name], BSN Entered By: [name], BSN, [name], CWS, [name] on 07/10/2022 09:22:45 [name] ([id number]) -------------------------------------------------------------------------------- Encounter Discharge Information Details Patient Name: [name], [name]. Date of Service: 07/10/2022 9:15 AM Medical Record Number: [id number] Patient Account Number: [id number] Date of Birth/Sex: Sep 19, 1946 (76 y.o. M) Treating [name]: [name] Primary Care Termaine Roupp: [name] Other Clinician: Referring [name]: [name] Treating [name]/Extender: [name] in Treatment: 3 Encounter Discharge Information Items Post Procedure Vitals Discharge Condition: Stable Temperature (F): 98.5 Ambulatory Status: Cane Pulse (bpm): 71 Discharge Destination: Home Respiratory Rate (breaths/min): 18 Transportation: Other Blood Pressure (mmHg): 168/70 Accompanied By: self Schedule Follow-up Appointment: Yes Clinical  Summary of Care: Electronic Signature(s) Signed: 07/13/2022 8:05:30 AM By: [name], BSN, [name], CWS, [name], BSN Entered By: [name], BSN, [name], CWS, [name] on 07/10/2022 09:59:39 [name] ([id number]) -------------------------------------------------------------------------------- Lower Extremity Assessment Details Patient Name: [name], [name]. Date of Service: 07/10/2022 9:15 AM Medical Record Number: [id number] Patient Account Number: [id number] Date of Birth/Sex: 01/15/1946 (76 y.o. M) Treating [name]: [name] Primary Care Analysse Quinonez: [name] Other Clinician: Referring Ernest Orr: [name] Treating [name]/Extender: [name] in Treatment: 3 Edema Assessment Assessed: [name]: Yes] [name]: No] [Left: Edema] [Right: :] Vascular Assessment Pulses: Dorsalis Pedis Palpable: [Left:Yes] Electronic Signature(s) Signed: 07/13/2022 8:05:30 AM By: [name], BSN, [name], CWS, [name], BSN Entered By: [name], BSN, [name], CWS, [name] on 07/10/2022 09:31:39 [name] ([id number]) -------------------------------------------------------------------------------- Multi Wound Chart Details Patient Name: [name], [name]. Date of Service: 07/10/2022 9:15 AM Medical Record Number: [id number] Patient Account Number: [id number] Date of Birth/Sex: 1945-10-31 (76 y.o. M) Treating [name]: [name] Primary Care Dylann Gallier: [name] Other Clinician: Referring [name]: [name] Treating [name]/Extender: [name] in Treatment: 3 Vital Signs Height(in): 70 Pulse(bpm): 61 Weight(lbs): 265 Blood Pressure(mmHg): 168/70 Body Mass Index(BMI): 38 Temperature(F): 98.5 Respiratory Rate(breaths/min): 18 Photos: [N/A:N/A] Wound Location: Left Calcaneus N/A N/A Wounding Event: Gradually Appeared N/A N/A Primary Etiology: Diabetic Wound/Ulcer of the Lower N/A N/A Extremity Secondary Etiology: Pressure Ulcer N/A N/A Comorbid History: Cataracts, Arrhythmia, Coronary N/A  N/A Artery Disease, Hypertension, Type II Diabetes, Osteoarthritis, Neuropathy Date Acquired: 06/05/2022 N/A N/A Weeks of Treatment: 3 N/A N/A Wound Status: Open N/A N/A Wound Recurrence: No N/A N/A Measurements L x W x D (cm) 0.5x1.2x0.1 N/A N/A Area (cm) : 0.471 N/A N/A Volume (cm) : 0.047 N/A N/A % Reduction in Area: 66.70% N/A N/A % Reduction in Volume: 83.40% N/A N/A Classification: Grade 1 N/A N/A Exudate Amount: Medium N/A N/A Exudate Type: Serous N/A N/A Exudate Color: amber N/A N/A Wound Margin: Flat and Intact N/A N/A Granulation Amount: Medium (34-66%) N/A N/A Necrotic Amount: Medium (34-66%) N/A N/A Exposed Structures: Fat Layer (Subcutaneous Tissue): N/A  N/A Yes Fascia: No Tendon: No Muscle: No Joint: No Bone: No Epithelialization: None N/A N/A Treatment Notes Electronic Signature(s) Signed: 07/13/2022 8:05:30 AM By: [name], BSN, [name], CWS, [name], BSN Entered By: [name], BSN, [name], CWS, [name] on 07/10/2022 09:48:06 [name] ([id number]) [name] ([id number]) -------------------------------------------------------------------------------- Multi-Disciplinary Care Plan Details Patient Name: [name], [name]. Date of Service: 07/10/2022 9:15 AM Medical Record Number: [id number] Patient Account Number: [id number] Date of Birth/Sex: 01/19/1946 (76 y.o. M) Treating [name]: [name] Primary Care [name]: [name] Other Clinician: Referring [name]: [name] Treating [name]/Extender: [name] in Treatment: 3 Active Inactive Pressure Nursing Diagnoses: Knowledge deficit related to causes and risk factors for pressure ulcer development Knowledge deficit related to management of pressures ulcers Potential for impaired tissue integrity related to pressure, friction, moisture, and shear Goals: Patient will remain free from development of additional pressure ulcers Date Initiated: 06/19/2022 Target Resolution Date: 06/19/2022 Goal  Status: Active Patient/caregiver will verbalize risk factors for pressure ulcer development Date Initiated: 06/19/2022 Target Resolution Date: 06/19/2022 Goal Status: Active Patient/caregiver will verbalize understanding of pressure ulcer management Date Initiated: 06/19/2022 Target Resolution Date: 06/19/2022 Goal Status: Active Interventions: Assess: immobility, friction, shearing, incontinence upon admission and as needed Assess offloading mechanisms upon admission and as needed Assess potential for pressure ulcer upon admission and as needed Provide education on pressure ulcers Notes: Wound/Skin Impairment Nursing Diagnoses: Impaired tissue integrity Goals: Patient/caregiver will verbalize understanding of skin care regimen Date Initiated: 06/19/2022 Date Inactivated: 07/10/2022 Target Resolution Date: 06/19/2022 Goal Status: Met Ulcer/skin breakdown will have a volume reduction of 30% by week 4 Date Initiated: 06/19/2022 Target Resolution Date: 07/17/2022 Goal Status: Active Interventions: Assess patient/caregiver ability to obtain necessary supplies Assess patient/caregiver ability to perform ulcer/skin care regimen upon admission and as needed Assess ulceration(s) every visit Provide education on ulcer and skin care Treatment Activities: Skin care regimen initiated : 06/19/2022 Topical wound management initiated : 06/19/2022 Notes: Electronic Signature(s) JARION, HAWTHORNE ([id number]) Signed: 07/13/2022 8:05:30 AM By: [name], BSN, [name], CWS, [name], BSN Entered By: [name], BSN, [name], CWS, [name] on 07/10/2022 09:32:05 [name] ([id number]) -------------------------------------------------------------------------------- Pain Assessment Details Patient Name: [name], [name]. Date of Service: 07/10/2022 9:15 AM Medical Record Number: [id number] Patient Account Number: [id number] Date of Birth/Sex: 02-26-1946 (76 y.o. M) Treating [name]: [name] Primary Care Charleen Madera: [name]  Other Clinician: Referring Charika Mikelson: [name] Treating Ahmari Duerson/Extender: [name] in Treatment: 3 Active Problems Location of Pain Severity and Description of Pain Patient Has Paino No Site Locations Pain Management and Medication Current Pain Management: Notes Patient denies pain at this time. Electronic Signature(s) Signed: 07/13/2022 8:05:30 AM By: [name], BSN, [name], CWS, [name], BSN Entered By: [name], BSN, [name], CWS, [name] on 07/10/2022 09:24:21 [name] ([id number]) -------------------------------------------------------------------------------- Patient/Caregiver Education Details Patient Name: [name], [name]. Date of Service: 07/10/2022 9:15 AM Medical Record Number: [id number] Patient Account Number: [id number] Date of Birth/Gender: 20-Nov-1945 (76 y.o. M) Treating [name]: [name] Primary Care Physician: [name] Other Clinician: Referring Physician: Fulton [name] Treating Physician/Extender: [name] in Treatment: 3 Education Assessment Education Provided To: Patient Education Topics Provided Wound/Skin Impairment: Handouts: Caring for Your Ulcer Methods: Demonstration, Explain/Verbal Responses: State content correctly Electronic Signature(s) Signed: 07/13/2022 8:05:30 AM By: [name], BSN, [name], CWS, [name], BSN Entered By: [name], BSN, [name], CWS, [name] on 07/10/2022 09:58:42 [name] ([id number]) -------------------------------------------------------------------------------- Wound Assessment Details Patient Name: [name], [name]. Date of Service: 07/10/2022 9:15 AM Medical Record Number: [id number]  Patient Account Number: [id number] Date of Birth/Sex: [birth year] (76 y.o. M) Treating [name]: [name] Primary Care Brigid Vandekamp: [name] Other Clinician: Referring Dayleen Beske: [name] Treating Varina Hulon/Extender: [name] in Treatment: 3 Wound Status Wound Number: 10 Primary Diabetic Wound/Ulcer of the Lower  Extremity Etiology: Wound Location: Left Calcaneus Secondary Pressure Ulcer Wounding Event: Gradually Appeared Etiology: Date Acquired: 06/05/2022 Wound Open Weeks Of Treatment: 3 Status: Clustered Wound: No Comorbid Cataracts, Arrhythmia, Coronary Artery Disease, History: Hypertension, Type II Diabetes, Osteoarthritis, Neuropathy Photos Wound Measurements Length: (cm) 1 % Reduct Width: (cm) 2.2 % Reduct Depth: (cm) 0.2 Epitheli Area: (cm) 1.728 Tunneli Volume: (cm) 0.346 Undermi ion in Area: -22.2% ion in Volume: -22.3% alization: None ng: No ning: No Wound Description Classification: Grade 1 Foul Odo Wound Margin: Flat and Intact Slough/F Exudate Amount: Medium Exudate Type: Serous Exudate Color: amber r After Cleansing: No ibrino Yes Wound Bed Granulation Amount: Medium (34-66%) Exposed Structure Necrotic Amount: Medium (34-66%) Fascia Exposed: No Fat Layer (Subcutaneous Tissue) Exposed: Yes Tendon Exposed: No Muscle Exposed: No Joint Exposed: No Bone Exposed: No Treatment Notes Wound #10 (Calcaneus) Wound Laterality: Left Cleanser Peri-Wound Care [name], [name] ([id number]) Topical Primary Dressing Prisma 4.34 (in) Discharge Instruction: Moisten w/normal saline or sterile water; Cover wound as directed. Do not remove from wound bed. Secondary Dressing ABD Pad 5x9 (in/in) Discharge Instruction: Cover with ABD pad Secured With Coban Cohesive Bandage 4x5 (yds) Stretched Discharge Instruction: Apply Coban as directed. Compression Wrap Compression Stockings Add-Ons Electronic Signature(s) Signed: 07/13/2022 8:05:30 AM By: [name], BSN, [name], CWS, [name], BSN Entered By: [name], BSN, [name], CWS, [name] on 07/10/2022 09:51:02 [name] ([id number]) -------------------------------------------------------------------------------- Vitals Details Patient Name: [name], [name]. Date of Service: 07/10/2022 9:15 AM Medical Record Number: [id number] Patient  Account Number: [id number] Date of Birth/Sex: September 08, 1946 (76 y.o. M) Treating [name]: [name] Primary Care Yuriel Lopezmartinez: [name] Other Clinician: Referring Edyn Qazi: [name] Treating Nivin Braniff/Extender: [name] in Treatment: 3 Vital Signs Time Taken: 09:22 Temperature (F): 98.5 Height (in): 70 Pulse (bpm): 71 Weight (lbs): 265 Respiratory Rate (breaths/min): 18 Body Mass Index (BMI): 38 Blood Pressure (mmHg): 168/70 Reference Range: 80 - 120 mg / dl Electronic Signature(s) Signed: 07/13/2022 8:05:30 AM By: [name], BSN, [name], CWS, [name], BSN Entered By: [name], BSN, [name], CWS, [name] on 07/10/2022 09:24:04

## 2022-07-20 ENCOUNTER — Ambulatory Visit: Payer: Medicare HMO | Admitting: Physician Assistant

## 2022-07-21 ENCOUNTER — Encounter: Payer: Self-pay | Admitting: Neurology

## 2022-07-21 ENCOUNTER — Ambulatory Visit: Payer: Medicare HMO | Admitting: Neurology

## 2022-07-23 ENCOUNTER — Encounter: Payer: Medicare HMO | Attending: Physician Assistant | Admitting: Physician Assistant

## 2022-07-23 ENCOUNTER — Other Ambulatory Visit
Admission: RE | Admit: 2022-07-23 | Discharge: 2022-07-23 | Disposition: A | Discharge status: Home / Self Care | Payer: Medicare HMO | Source: Ambulatory Visit | Attending: Physician Assistant | Admitting: Physician Assistant

## 2022-07-23 DIAGNOSIS — B999 Unspecified infectious disease: Secondary | ICD-10-CM | POA: Insufficient documentation

## 2022-07-23 DIAGNOSIS — L97522 Non-pressure chronic ulcer of other part of left foot with fat layer exposed: Secondary | ICD-10-CM | POA: Diagnosis not present

## 2022-07-23 DIAGNOSIS — E11621 Type 2 diabetes mellitus with foot ulcer: Secondary | ICD-10-CM | POA: Diagnosis present

## 2022-07-23 DIAGNOSIS — E1142 Type 2 diabetes mellitus with diabetic polyneuropathy: Secondary | ICD-10-CM | POA: Diagnosis not present

## 2022-07-23 NOTE — Progress Notes (Addendum)
[name] ([id number]) Visit Report for 07/23/2022 Chief Complaint Document Details Patient Name: [name], [name]. Date of Service: 07/23/2022 9:15 AM Medical Record Number: [id number] Patient Account Number: [id number] Date of Birth/Sex: 04/23/1946 (76 y.o. M) Treating RN: [name] Primary Care Provider: Fulton Reek Other Clinician: Referring Provider: Fulton Reek Treating Provider/Extender: Skipper Cliche in Treatment: 4 Information Obtained from: Patient Chief Complaint Left heel ulcer Electronic Signature(s) Signed: 07/23/2022 9:49:02 AM By: Worthy Keeler PA-C Entered By: Worthy Keeler on 07/23/2022 09:49:02 AUDY, DAUPHINE ([id number]) -------------------------------------------------------------------------------- Debridement Details Patient Name: [name]. Date of Service: 07/23/2022 9:15 AM Medical Record Number: [id number] Patient Account Number: [id number] Date of Birth/Sex: 08-18-1946 (76 y.o. M) Treating RN: [name] Primary Care Provider: Fulton Reek Other Clinician: Referring Provider: Fulton Reek Treating Provider/Extender: Skipper Cliche in Treatment: 4 Debridement Performed for Wound #10 Left Calcaneus Assessment: Performed By: Physician Tommie Sams., PA-C Debridement Type: Debridement Severity of Tissue Pre Debridement: Fat layer exposed Level of Consciousness (Pre- Awake and Alert procedure): Pre-procedure Verification/Time Out Yes - 09:53 Taken: Pain Control: Lidocaine Total Area Debrided (L x W): 1.6 (cm) x 2.5 (cm) = 4 (cm) Tissue and other material Viable, Non-Viable, Slough, Subcutaneous, Biofilm, Slough debrided: Level: Skin/Subcutaneous Tissue Debridement Description: Excisional Instrument: Curette Specimen: Swab, Number of Specimens Taken: 1 Bleeding: Minimum Hemostasis Achieved: Silver Nitrate Response to Treatment: Procedure was tolerated well Level of Consciousness (Post- Awake and  Alert procedure): Post Debridement Measurements of Total Wound Length: (cm) 1.6 Width: (cm) 2.5 Depth: (cm) 0.3 Volume: (cm) 0.942 Character of Wound/Ulcer Post Debridement: Stable Severity of Tissue Post Debridement: Fat layer exposed Post Procedure Diagnosis Same as Pre-procedure Electronic Signature(s) Signed: 07/23/2022 2:50:44 PM By: Worthy Keeler PA-C Signed: 07/24/2022 3:11:48 PM By: [name], BSN, RN, CWS, [name] RN, BSN Entered By: [name], BSN, RN, CWS, [name] on 07/23/2022 10:01:32 [name] ([id number]) -------------------------------------------------------------------------------- HPI Details Patient Name: [name]. Date of Service: 07/23/2022 9:15 AM Medical Record Number: [id number] Patient Account Number: [id number] Date of Birth/Sex: [birth year] (76 y.o. M) Treating RN: [name] Primary Care Provider: Fulton Reek Other Clinician: Referring Provider: Fulton Reek Treating Provider/Extender: Skipper Cliche in Treatment: 4 History of Present Illness HPI Description: 09/24/2020 on evaluation today patient presents today for a heel ulcer that he tells me has been present for about 2 years. He has been seeing podiatry and they have been attempting to manage this including what sounds to be a total contact cast, Unna boot, and just standard dressings otherwise as well. Most recently has been using triple antibiotic ointment. With that being said unfortunately despite everything he really has not had any significant improvement. He tells me that he cannot even really remember exactly how this began but he presumed it may have rubbed on his shoes or something of that nature. 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 He also does have chronic pain in the way of neuropathy which he takes medications  for including Cymbalta and methadone. He tells me that this does seem to help. Fortunately there is no signs of active infection at this time. His most recent hemoglobin A1c was 8.1 though he knows this was this year he cannot tell me the exact time. His fluid pills currently to help with some of the lower extremity edema although  he does obviously have signs of venous stasis/lymphedema. Currently there is no evidence of active infection. No fevers, chills, nausea, vomiting, or diarrhea. Patient has had fairly recent ABIs which were performed on 07/19/2020 and revealed that he has normal findings in both the ankle and toe locations bilaterally. His ABI on the right was 1.09 on the left was 1.08 with a TBI on the right of 0.88 and on the left of 0.94. Triphasic flow was noted throughout. 10/08/2020 on evaluation today patient appears to be doing pretty well in regard to his left heel currently in fact this is doing a great job and seems to be healing quite nicely. Unfortunately on his right leg he had a pile of wood that actually fell on him injuring his right leg this is somewhat erythematous has me concerned little bit about cellulitis though there is not really a good area to culture at this point. 10/24/2020 upon evaluation today patient appears to be doing well with regard to his heel ulcer. He is showing signs of improvement which is great news. His right leg is completely healed. Overall I feel like he is doing excellent and there is no signs of infection. 11/07/2020 upon evaluation today patient appears to be doing well with regard to his heel ulcer. He tells me that last week when he was unable to come in his wife actually thought that the wound was very close to closing if not closed. Then it began to "reopen again". I really feel like what may have happened as the collagen may have dried over the wound bed and that because that misunderstanding with thinking that the wound was healing. With that  being said I did not see it last week I do not know that for certain. Either way I feel like he is doing great today I see no signs of infection at this point. 11/26/2020 upon inspection today patient appears to be doing decently well in regard to his heel ulcer. He has been tolerating the dressing changes without complication. Fortunately there is no sign of active infection at this time. No fevers, chills, nausea, vomiting, or diarrhea. 12/10/2020 upon evaluation today patient appears to be doing fairly well in regard to the wound on his heel as well as what appears to be a new wound of the left first metatarsal head plantar aspect. This seems to be an area that was callus that has split as the patient tells me has been trying to walk on his toes more has probably where this came from. With that being said there does not appear to be signs of active infection which is great news. 12/17/2020 upon evaluation today patient actually appears to be making good progress currently. Fortunately there is no evidence of active infection at this time. Overall I feel like he is very close to complete closure. 12/26/2020 upon evaluation today patient appears to be doing excellent in regard to his wounds. In fact I am not certain that these are not even completely healed on initial inspection. Overall I am very pleased with where things stand today. Good news is that they are healed he is actually get ready to go out of town and that will be helpful as well as he will be a full part of the time. Readmission: 02/04/2021 upon evaluation today patient appears to be doing well at this point in regard to his left heel that I previously saw him for. Unfortunately he is having issues with his right lower extremity. He  has significant wounds at this point he also has erythema noted there is definite signs of cellulitis which is unfortunate. With that being said I think we do need to address this sooner rather than later. The  good news is he did have a nice trip to the beach. He tells me that he had no issues during that time. 4/27; patient on Bactrim. Culture showed methicillin sensitive staph aureus therefore the Bactrim should be effective. 2 small areas on the right leg are healed the area on the mid aspect of the tibia almost 100% covered in a very adherent necrotic debris. We have been using silver alginate 02/20/2021 upon evaluation today patient appears to be doing well with regard to his wound. He is showing signs of improvement which is great news overall very pleased with where things stand today. No fevers, chills, nausea, vomiting, or diarrhea. 02/27/2021 upon evaluation today patient appears to be doing well with regard to his leg ulcer. He is tolerating the dressing changes and overall appears to be doing quite excellent. I am extremely pleased with where things stand and overall I think patient is making great progress. There is no sign of active infection at this time which is also great news. 03/06/2021 upon evaluation today patient appears to be doing excellent in regard to his wounds. In fact he appears to be completely healed today based on what I am seeing. This is excellent news and overall I am extremely pleased with where he stands. Overall the patient is happy to hear this as well this has been quite sometime coming. Readmission: 04/01/2021 patient unfortunately returns for readmission today. He tells me that he has been wearing his compression socks on the right leg daily CLAUDIE, RATHBONE ([id number]) and he does not really know what is going on and why his legs are doing what they are doing. We did therefore go ahead and probe deeper into exactly what has been going on with him today. Subsequently the patient tells me that when he gets up and what we would call "first thing in the morning" are really 2 different things". He does not tend to sleep well so he tells me that he will often wake up at  3:00 in the morning. He will then potentially going into the living room to get in his chair where he may read a book for a little while and then potentially fall asleep back in his chair. He then subsequently wake up around 5:00 or so and then get up and in his words "putter around". This often will end with him proceeding at some point around 8 AM or 9 AM to putting on his compression socks. With that being said this means that anywhere from the 3:00 in the morning till roughly around 9:00 in the morning he has no compression on yet he is sitting in his recliner, walking around and up and about, and this is at least 5 to 6 hours of noncompressed time. That may be our issue here but I did not realize until we discussed this further today. Fortunately there does not appear to be any signs of active infection at this time which is great news. With that being said he has multiple wounds of the bilateral lower extremities. 04/10/2021 upon evaluation today patient appears to be doing well with regard to his legs for the most part. He does look like he had some injury where his wrap slid down nonetheless he did some "doctoring on them". I  do feel like most of the areas honestly have cleared back up which is great news. There does not appear to be any signs of active infection at this time which is also great news. No fevers, chills, nausea, vomiting, or diarrhea. 04/18/2021 upon evaluation today patient appears to be doing well with regard to his wounds in fact he appears to be completely healed which is great news. Fortunately there does not appear to be any signs of active infection which is great news. No fevers, chills, nausea, vomiting, or diarrhea. READMISSION 07/28/2021 This is a now 76 year old man we have had in this clinic for quite a bit of this year. 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 He wears compression stockings.  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 He comes in an old running shoes although he says he wears better shoes at home. His history is that he noticed a blister in the right posterior heel. This open. He has been applying Neosporin to it currently the wound measures 2 x 1.4 cm. 100% slough covered. Not really offloading this in any rigorous way. Past medical history is essentially unchanged he has paroxysmal atrial fibrillation on Coumadin type 2 diabetes with a recent hemoglobin A1c of 7 on metformin. ABI in our clinic was 0.98 on the right 08/05/2021 upon evaluation today patient appears to be doing well with regard to his heel ulcer. Fortunately there does not appear to be any signs of active infection at this time. Overall I been very pleased with where things seem to be currently as far as the wound healing is concerned. Again this is first a lot of seeing him Dr. Dellia Nims readmitted him last week. Nonetheless I think we are definitely making some progress here. 08/11/2021 upon evaluation today patient's wound on the heel actually showing signs of excellent improvement. I am actually very pleased with where things stand currently. No fevers, chills, nausea, vomiting, or diarrhea. There does not appear to be any need for sharp debridement today either which is also great news. 08/25/2021 upon evaluation today patient appears to be doing well with regard to his heel ulcer. This is actually looking significantly improved compared to last time I saw him. Fortunately there does not appear to be any evidence of active infection at this time. No fevers, chills, nausea, vomiting, or diarrhea. 09/01/2021 upon evaluation today patient appears to be doing decently well in regard to his wounds. Fortunately there does not appear to be any signs of active infection at this time which is great news and overall very pleased in that regard. I do not see any evidence of  active infection systemically which is great news as well. No fevers, chills, nausea, vomiting, or diarrhea. I think that the heel is making excellent progress. 09/15/2021 upon evaluation today patient's heel unfortunately is significantly worse compared to what it was previous. I do believe that at this time he would benefit from switching back to something a little bit more offloading from the cushion shoe he has right now this is more of a heel protector what I really need is a Prevalon offloading boot which I think is good to do a lot better for him than just the small heel protector. 09/23/2021 upon evaluation today patient appears to be doing a little better in regards to last week's visit. Overall I think that he is making good progress which is great news  and there does not appear to be any evidence of active infection at this time. No fevers, chills, nausea, vomiting, or diarrhea. 09/30/2021 upon evaluation patient's heel is actually showing signs of good improvement which is great news. Fortunately there does not appear to be any evidence of active infection locally nor systemically at this point. No fevers, chills, nausea, vomiting, or diarrhea. 10/07/2021 upon evaluation today patient appears to be doing well currently in regard to his heel ulcer. He has been tolerating the dressing changes without complication. Fortunately I do not see any signs of active infection at this time which is great news. No fevers, chills, nausea, vomiting, or diarrhea. 12/27; wound is measuring smaller. We have been using silver alginate 10/28/2021 upon evaluation today patient appears to be doing well currently in regard to his heel ulcer. I am actually very pleased with where things stand and I think he is making excellent progress. Fortunately I do not see any signs of active infection locally nor systemically at this point which is great news. Nonetheless I do believe that the patient would benefit from a  new offloading shoe and has been using it Velcro is no longer functioning properly and he tells me that he almost tripped and fell because of that today. Obviously I do not want him following that would be very bad. 11/11/2021 upon evaluation today patient appears to be doing excellent in regard to his wound. In fact this appears to be completely healed based on what I see currently. I do not see any signs of anything open or draining and I did double check just by clearing away some of the callus around the edges of the wound to ensure that there was nothing still open or hiding underneath. Once this was cleared away it appeared that the patient was doing significantly better at this time which is great news and there was nothing actually open. Readmission: VICTORMANUEL, MCLURE ([id number]) 06-19-2022 upon evaluation today patient appears to be doing well currently in regard to his heel ulcer all things considered. He unfortunately is having a bit of breakdown in general as far as the heel is concerned not nearly as bad as what we noted last time he was here in the clinic. The good news is I do think that this should hopefully heal much more rapidly than what we noted last time. His past medical history has not changed he is on Coumadin. 07-03-2022 upon evaluation today patient appears to be doing better in regard to his heel. We will start to see some improvement here which is good news. Fortunately I do not see any evidence of infection locally or systemically at this time which is great news. No fevers, chills, nausea, vomiting, or diarrhea. 07-10-2022 upon evaluation today patient appears to be doing well currently in regard to his wound from the callus standpoint around the edges. Unfortunately from an actual wound bed standpoint he has some deep tissue injury noted at this point. Again I am not sure exactly what is going on that is causing this pressure to the region but he is definitely gotten  pressure that is occurring and causing this to not heal as effectively as what we would like to see. I discussed with him today that he may need to at night when he sleeping use a pillow up underneath his calf in order to prevent the heel from touching the bed at all. I also think that it may be beneficial for him to monitor throughout  his day and make sure there is no other time when he is getting the pressure to the heel. 07-23-2022 upon evaluation today patient appears to be doing poorly currently in regard to his heel ulcer. He has been tolerating the dressing changes without complication. Unfortunately I feel like he may be infected based on what I am seeing. Electronic Signature(s) Signed: 07/23/2022 10:37:46 AM By: Worthy Keeler PA-C Entered By: Worthy Keeler on 07/23/2022 10:37:46 [name] ([id number]) -------------------------------------------------------------------------------- Physical Exam Details Patient Name: QUILLAN, WHITTER Date of Service: 07/23/2022 9:15 AM Medical Record Number: [id number] Patient Account Number: [id number] Date of Birth/Sex: August 19, 1946 (76 y.o. M) Treating RN: [name] Primary Care Provider: Fulton Reek Other Clinician: Referring Provider: Fulton Reek Treating Provider/Extender: Skipper Cliche in Treatment: 4 Constitutional Well-nourished and well-hydrated in no acute distress. Respiratory normal breathing without difficulty. Psychiatric this patient is able to make decisions and demonstrates good insight into disease process. Alert and Oriented x 3. pleasant and cooperative. Notes Upon inspection patient's wound bed actually did show signs of necrotic tissue that required debridement today. I performed debridement of clearway the necrotic debris postdebridement the wound bed appears to be doing much better. Electronic Signature(s) Signed: 07/23/2022 10:37:59 AM By: Worthy Keeler PA-C Entered By: Worthy Keeler on  07/23/2022 10:37:59 [name] ([id number]) -------------------------------------------------------------------------------- Physician Orders Details Patient Name: COLTER, MAGOWAN. Date of Service: 07/23/2022 9:15 AM Medical Record Number: [id number] Patient Account Number: [id number] Date of Birth/Sex: June 10, 1946 (76 y.o. 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 Bathing/ Shower/ Hygiene o May shower; gently cleanse wound with antibacterial soap, rinse and pat dry prior to dressing wounds Non-Wound Condition o Additional non-wound orders/instructions: - Apply 40% Urea cream to dry cracked skin on feet. Off-Loading o Open toe surgical shoe with peg assist. Medications-Please add to medication list. o P.O. Antibiotics Wound Treatment Wound #10 - Calcaneus Wound Laterality: Left Primary Dressing: Prisma 4.34 (in) (Generic) 3 x Per Week/30 Days Discharge Instructions: Moisten w/normal saline or sterile water; Cover wound as directed. Do not remove from wound bed. Secondary Dressing: ABD Pad 5x9 (in/in) (Generic) 3 x Per Week/30 Days Discharge Instructions: Cover with ABD pad Secured With: Coban Cohesive Bandage 4x5 (yds) Stretched 3 x Per Week/30 Days Discharge Instructions: Apply Coban as directed. Laboratory o Bacteria identified in Wound by Culture (MICRO) - Left Heel oooo LOINC Code: 6546-5 KPTW Convenience Name: Wound culture routine Patient Medications Allergies:  tetracycline Notifications Medication Indication Start End Bactrim DS 07/23/2022 DOSE 1 - oral 800 mg-160 mg tablet - 1 tablet oral taken 2 times per day for 14 days KAMIL, MCHAFFIE ([id number]) Electronic Signature(s) Signed: 07/23/2022 10:41:36 AM By: Worthy Keeler PA-C Entered By: Worthy Keeler on 07/23/2022 10:41:36 [name] ([id number]) -------------------------------------------------------------------------------- Problem List Details Patient Name: KAYMON, DENOMME. Date of Service: 07/23/2022 9:15 AM Medical Record Number: [id number] Patient Account Number: [id number] Date of Birth/Sex: 1946-01-12 (76 y.o. M) Treating RN: [name] Primary Care Provider: Fulton Reek Other Clinician: Referring Provider: Fulton Reek Treating Provider/Extender: Skipper Cliche in Treatment: 4  Active Problems ICD-10 Encounter Code Description Active Date MDM Diagnosis E11.621 Type 2 diabetes mellitus with foot ulcer 06/19/2022 No Yes L97.522 Non-pressure chronic ulcer of other part of left foot with fat layer 06/19/2022 No Yes exposed E11.42 Type 2 diabetes mellitus with diabetic polyneuropathy 06/19/2022 No Yes Inactive Problems Resolved Problems Electronic Signature(s) Signed: 07/23/2022 9:48:58 AM By: Worthy Keeler PA-C Entered By: Worthy Keeler on 07/23/2022 09:48:58 [name] ([id number]) -------------------------------------------------------------------------------- Progress Note Details Patient Name: [name]. Date of Service: 07/23/2022 9:15 AM Medical Record Number: [id number] Patient Account Number: [id number] Date of Birth/Sex: 1946/08/12 (76 y.o. M) Treating RN: [name] Primary Care Provider: Fulton Reek Other Clinician: Referring Provider: Fulton Reek Treating Provider/Extender: Skipper Cliche in Treatment: 4 Subjective Chief Complaint Information obtained from Patient Left heel ulcer History of Present Illness  (HPI) 09/24/2020 on evaluation today patient presents today for a heel ulcer that he tells me has been present for about 2 years. He has been seeing podiatry and they have been attempting to manage this including what sounds to be a total contact cast, Unna boot, and just standard dressings otherwise as well. Most recently has been using triple antibiotic ointment. With that being said unfortunately despite everything he really has not had any significant improvement. He tells me that he cannot even really remember exactly how this began but he presumed it may have rubbed on his shoes or something of that nature. 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 He tells me that this does seem to help. Fortunately there is no signs of active infection at this time. His most recent hemoglobin A1c was 8.1 though he knows this was this year he cannot tell me the exact time. His fluid pills currently to help with some of the lower extremity edema although he does obviously have signs of venous stasis/lymphedema. Currently there is no evidence of active infection. No fevers, chills, nausea, vomiting, or diarrhea. Patient has had fairly recent ABIs which were performed on 07/19/2020 and revealed that he has normal findings in both the ankle and toe locations bilaterally. His ABI on the right was 1.09 on the left was 1.08 with a TBI on the right of 0.88 and on the left of 0.94. Triphasic flow was noted throughout. 10/08/2020 on evaluation today patient appears to be doing pretty well in regard to his left heel currently in fact this is doing a great job and seems to be healing quite nicely. Unfortunately on his right leg he had a pile of  wood that actually fell on him injuring his right leg this is somewhat erythematous has me concerned little bit about cellulitis though there is not really a good area to culture at this point. 10/24/2020 upon evaluation today patient appears to be doing well with regard to his heel ulcer. He is showing signs of improvement which is great news. His right leg is completely healed. Overall I feel like he is doing excellent and there is no signs of infection. 11/07/2020 upon evaluation today patient appears to be doing well with regard to his heel ulcer. He tells me that last week when he was unable to come in his wife actually thought that the  wound was very close to closing if not closed. Then it began to "reopen again". I really feel like what may have happened as the collagen may have dried over the wound bed and that because that misunderstanding with thinking that the wound was healing. With that being said I did not see it last week I do not know that for certain. Either way I feel like he is doing great today I see no signs of infection at this point. 11/26/2020 upon inspection today patient appears to be doing decently well in regard to his heel ulcer. He has been tolerating the dressing changes without complication. Fortunately there is no sign of active infection at this time. No fevers, chills, nausea, vomiting, or diarrhea. 12/10/2020 upon evaluation today patient appears to be doing fairly well in regard to the wound on his heel as well as what appears to be a new wound of the left first metatarsal head plantar aspect. This seems to be an area that was callus that has split as the patient tells me has been trying to walk on his toes more has probably where this came from. With that being said there does not appear to be signs of active infection which is great news. 12/17/2020 upon evaluation today patient actually appears to be making good progress currently. Fortunately there is no evidence of  active infection at this time. Overall I feel like he is very close to complete closure. 12/26/2020 upon evaluation today patient appears to be doing excellent in regard to his wounds. In fact I am not certain that these are not even completely healed on initial inspection. Overall I am very pleased with where things stand today. Good news is that they are healed he is actually get ready to go out of town and that will be helpful as well as he will be a full part of the time. Readmission: 02/04/2021 upon evaluation today patient appears to be doing well at this point in regard to his left heel that I previously saw him for. Unfortunately he is having issues with his right lower extremity. He has significant wounds at this point he also has erythema noted there is definite signs of cellulitis which is unfortunate. With that being said I think we do need to address this sooner rather than later. The good news is he did have a nice trip to the beach. He tells me that he had no issues during that time. 4/27; patient on Bactrim. Culture showed methicillin sensitive staph aureus therefore the Bactrim should be effective. 2 small areas on the right leg are healed the area on the mid aspect of the tibia almost 100% covered in a very adherent necrotic debris. We have been using silver alginate 02/20/2021 upon evaluation today patient appears to be doing well with regard to his wound. He is showing signs of improvement which is great news overall very pleased with where things stand today. No fevers, chills, nausea, vomiting, or diarrhea. 02/27/2021 upon evaluation today patient appears to be doing well with regard to his leg ulcer. He is tolerating the dressing changes and overall appears to be doing quite excellent. I am extremely pleased with where things stand and overall I think patient is making great progress. There is no sign of active infection at this time which is also great news. 03/06/2021 upon  evaluation today patient appears to be doing excellent in regard to his wounds. In fact he appears to be completely healed today based on what  I am seeing. This is excellent news and overall I am extremely pleased with where he stands. Overall the patient is happy to hear DANYELL, AWBREY ([id number]) this as well this has been quite sometime coming. Readmission: 04/01/2021 patient unfortunately returns for readmission today. He tells me that he has been wearing his compression socks on the right leg daily and he does not really know what is going on and why his legs are doing what they are doing. We did therefore go ahead and probe deeper into exactly what has been going on with him today. Subsequently the patient tells me that when he gets up and what we would call "first thing in the morning" are really 2 different things". He does not tend to sleep well so he tells me that he will often wake up at 3:00 in the morning. He will then potentially going into the living room to get in his chair where he may read a book for a little while and then potentially fall asleep back in his chair. He then subsequently wake up around 5:00 or so and then get up and in his words "putter around". This often will end with him proceeding at some point around 8 AM or 9 AM to putting on his compression socks. With that being said this means that anywhere from the 3:00 in the morning till roughly around 9:00 in the morning he has no compression on yet he is sitting in his recliner, walking around and up and about, and this is at least 5 to 6 hours of noncompressed time. That may be our issue here but I did not realize until we discussed this further today. Fortunately there does not appear to be any signs of active infection at this time which is great news. With that being said he has multiple wounds of the bilateral lower extremities. 04/10/2021 upon evaluation today patient appears to be doing well with regard to his  legs for the most part. He does look like he had some injury where his wrap slid down nonetheless he did some "doctoring on them". I do feel like most of the areas honestly have cleared back up which is great news. There does not appear to be any signs of active infection at this time which is also great news. No fevers, chills, nausea, vomiting, or diarrhea. 04/18/2021 upon evaluation today patient appears to be doing well with regard to his wounds in fact he appears to be completely healed which is great news. Fortunately there does not appear to be any signs of active infection which is great news. No fevers, chills, nausea, vomiting, or diarrhea. READMISSION 07/28/2021 This is a now 76 year old man we have had in this clinic for quite a bit of this year. 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 He wears compression stockings. 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 He comes in an old running shoes although he says he wears better shoes at home. His history is that he noticed a blister in the right posterior heel. This open. He has been applying Neosporin to it currently the wound measures 2 x 1.4 cm. 100% slough covered. Not really offloading this in any rigorous way. Past medical history is essentially unchanged he has paroxysmal atrial fibrillation on Coumadin type 2 diabetes with a recent hemoglobin A1c of 7 on metformin. ABI in our clinic was 0.98 on the right 08/05/2021  upon evaluation today patient appears to be doing well with regard to his heel ulcer. Fortunately there does not appear to be any signs of active infection at this time. Overall I been very pleased with where things seem to be currently as far as the wound healing is concerned. Again this is first a lot of seeing him Dr. Dellia Nims readmitted him last week. Nonetheless I think we are definitely making some progress  here. 08/11/2021 upon evaluation today patient's wound on the heel actually showing signs of excellent improvement. I am actually very pleased with where things stand currently. No fevers, chills, nausea, vomiting, or diarrhea. There does not appear to be any need for sharp debridement today either which is also great news. 08/25/2021 upon evaluation today patient appears to be doing well with regard to his heel ulcer. This is actually looking significantly improved compared to last time I saw him. Fortunately there does not appear to be any evidence of active infection at this time. No fevers, chills, nausea, vomiting, or diarrhea. 09/01/2021 upon evaluation today patient appears to be doing decently well in regard to his wounds. Fortunately there does not appear to be any signs of active infection at this time which is great news and overall very pleased in that regard. I do not see any evidence of active infection systemically which is great news as well. No fevers, chills, nausea, vomiting, or diarrhea. I think that the heel is making excellent progress. 09/15/2021 upon evaluation today patient's heel unfortunately is significantly worse compared to what it was previous. I do believe that at this time he would benefit from switching back to something a little bit more offloading from the cushion shoe he has right now this is more of a heel protector what I really need is a Prevalon offloading boot which I think is good to do a lot better for him than just the small heel protector. 09/23/2021 upon evaluation today patient appears to be doing a little better in regards to last week's visit. Overall I think that he is making good progress which is great news and there does not appear to be any evidence of active infection at this time. No fevers, chills, nausea, vomiting, or diarrhea. 09/30/2021 upon evaluation patient's heel is actually showing signs of good improvement which is great news.  Fortunately there does not appear to be any evidence of active infection locally nor systemically at this point. No fevers, chills, nausea, vomiting, or diarrhea. 10/07/2021 upon evaluation today patient appears to be doing well currently in regard to his heel ulcer. He has been tolerating the dressing changes without complication. Fortunately I do not see any signs of active infection at this time which is great news. No fevers, chills, nausea, vomiting, or diarrhea. 12/27; wound is measuring smaller. We have been using silver alginate 10/28/2021 upon evaluation today patient appears to be doing well currently in regard to his heel ulcer. I am actually very pleased with where things stand and I think he is making excellent progress. Fortunately I do not see any signs of active infection locally nor systemically at this point which is great news. Nonetheless I do believe that the patient would benefit from a new offloading shoe and has been using it Velcro is no longer functioning properly and he tells me that he almost tripped and fell because of that today. Obviously I do not want him following that would be very bad. 11/11/2021 upon evaluation today patient appears to be doing excellent  in regard to his wound. In fact this appears to be completely healed based on what I see currently. I do not see any signs of anything open or draining and I did double check just by clearing away some of the callus JUANMIGUEL, DEFELICE. ([id number]) around the edges of the wound to ensure that there was nothing still open or hiding underneath. Once this was cleared away it appeared that the patient was doing significantly better at this time which is great news and there was nothing actually open. Readmission: 06-19-2022 upon evaluation today patient appears to be doing well currently in regard to his heel ulcer all things considered. He unfortunately is having a bit of breakdown in general as far as the heel is  concerned not nearly as bad as what we noted last time he was here in the clinic. The good news is I do think that this should hopefully heal much more rapidly than what we noted last time. His past medical history has not changed he is on Coumadin. 07-03-2022 upon evaluation today patient appears to be doing better in regard to his heel. We will start to see some improvement here which is good news. Fortunately I do not see any evidence of infection locally or systemically at this time which is great news. No fevers, chills, nausea, vomiting, or diarrhea. 07-10-2022 upon evaluation today patient appears to be doing well currently in regard to his wound from the callus standpoint around the edges. Unfortunately from an actual wound bed standpoint he has some deep tissue injury noted at this point. Again I am not sure exactly what is going on that is causing this pressure to the region but he is definitely gotten pressure that is occurring and causing this to not heal as effectively as what we would like to see. I discussed with him today that he may need to at night when he sleeping use a pillow up underneath his calf in order to prevent the heel from touching the bed at all. I also think that it may be beneficial for him to monitor throughout his day and make sure there is no other time when he is getting the pressure to the heel. 07-23-2022 upon evaluation today patient appears to be doing poorly currently in regard to his heel ulcer. He has been tolerating the dressing changes without complication. Unfortunately I feel like he may be infected based on what I am seeing. Objective Constitutional Well-nourished and well-hydrated in no acute distress. Vitals Time Taken: 9:27 AM, Height: 70 in, Weight: 265 lbs, BMI: 38, Temperature: 98.3 F, Pulse: 81 bpm, Respiratory Rate: 16 breaths/min, Blood Pressure: 124/62 mmHg. Respiratory normal breathing without difficulty. Psychiatric this patient is able  to make decisions and demonstrates good insight into disease process. Alert and Oriented x 3. pleasant and cooperative. General Notes: Upon inspection patient's wound bed actually did show signs of necrotic tissue that required debridement today. I performed debridement of clearway the necrotic debris postdebridement the wound bed appears to be doing much better. Integumentary (Hair, Skin) Wound #10 status is Open. Original cause of wound was Gradually Appeared. The date acquired was: 06/05/2022. The wound has been in treatment 4 weeks. The wound is located on the Left Calcaneus. The wound measures 1.6cm length x 2.5cm width x 0.2cm depth; 3.142cm^2 area and 0.628cm^3 volume. There is Fat Layer (Subcutaneous Tissue) exposed. There is a medium amount of serous drainage noted. The wound margin is flat and intact. There is medium (34-66%) granulation  within the wound bed. There is a medium (34-66%) amount of necrotic tissue within the wound bed. General Notes: Dried skin and callus around wound. 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 There was a Excisional Skin/Subcutaneous Tissue Debridement with a total area of 4 sq cm performed by Tommie Sams., PA-C. With the following instrument(s): Curette to remove Viable and Non-Viable tissue/material. Material removed includes Subcutaneous Tissue, Slough, and Biofilm after achieving pain control using Lidocaine. 1 specimen was taken by a Swab and sent to the lab per facility protocol. A time out was conducted at 09:53, prior to the start of the procedure. A Minimum  amount of bleeding was controlled with Silver Nitrate. The procedure was tolerated well. Post Debridement Measurements: 1.6cm length x 2.5cm width x 0.3cm depth; 0.942cm^3 volume. Character of Wound/Ulcer Post Debridement is stable. Severity of Tissue Post Debridement is: Fat layer exposed. Post procedure Diagnosis Wound #10: Same as Pre-Procedure 1. I am going to suggest that we actually have the patient go ahead and initiate treatment with a continuation of the silver collagen followed by the ABD pad and roll gauze to secure in place. 2. I am also going to go ahead and send in a prescription as well for the patient and this is going to be Bactrim DS that I am sending in for him this is a good broad-spectrum antibiotic and again I think will be appropriate for treatment. Based on the culture results will make any adjustments as necessary. He is advised to let his Coumadin clinic know that he is going to be on this he is going there next after he leaves here today. We will see patient back for reevaluation in 1 week here in the clinic. If anything worsens or changes patient will contact our office for additional recommendations. Plan Follow-up Appointments: Wound #10 Left Calcaneus: Return Appointment in 1 week. Bathing/ Shower/ Hygiene: May shower; gently cleanse wound with antibacterial soap, rinse and pat dry prior to dressing wounds Non-Wound Condition: Additional non-wound orders/instructions: - Apply 40% Urea cream to dry cracked skin on feet. Off-Loading: Open toe surgical shoe with peg assist. Medications-Please add to medication list.: P.O. Antibiotics Laboratory ordered were: Wound culture routine - Left Heel The following medication(s) was prescribed: Bactrim DS oral 800 mg-160 mg tablet 1 1 tablet oral taken 2 times per day for 14 days starting 07/23/2022 WOUND #10: - Calcaneus Wound Laterality: Left Primary Dressing: Prisma 4.34 (in) (Generic) 3 x Per Week/30 Days Discharge  Instructions: Moisten w/normal saline or sterile water; Cover wound as directed. Do not remove from wound bed. Secondary Dressing: ABD Pad 5x9 (in/in) (Generic) 3 x Per Week/30 Days Discharge Instructions: Cover with ABD pad Secured With: Coban Cohesive Bandage 4x5 (yds) Stretched 3 x Per Week/30 Days Discharge Instructions: Apply Coban as directed. Electronic Signature(s) Signed: 07/23/2022 10:41:43 AM By: Worthy Keeler PA-C Entered By: Worthy Keeler on 07/23/2022 10:41:43 [name] ([id number]) -------------------------------------------------------------------------------- SuperBill Details Patient Name: DAEMIAN, GAHM. Date of Service: 07/23/2022 Medical Record Number: [id number] Patient Account Number: [id number] Date of Birth/Sex: 1946/04/29 (76 y.o. M) Treating RN: [name] Primary Care Provider: Fulton Reek Other Clinician: Referring  Provider: Fulton Reek Treating Provider/Extender: Skipper Cliche in Treatment: 4 Diagnosis Coding ICD-10 Codes Code Description E11.621 Type 2 diabetes mellitus with foot ulcer 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sed E11.42 Type 2 diabetes mellitus with diabetic polyneuropathy Facility Procedures CPT4 Code: [id number] Description: 85027 - DEB SUBQ TISSUE 20 SQ CM/< Modifier: Quantity: 1 CPT4 Code: Description: ICD-10 Diagnosis Description L97.522 Non-pressure chronic ulcer of other part of left foot with fat layer exp Modifier: osed Quantity: Physician Procedures CPT4 Code: [id number] Description: 67672 - WC PHYS LEVEL 4 - EST PT Modifier: 25 Quantity: 1 CPT4 Code: Description: ICD-10 Diagnosis Description E11.621 Type 2 diabetes mellitus with foot ulcer L97.522 Non-pressure chronic ulcer of other part of left foot with fat layer exp E11.42 Type 2 diabetes mellitus with diabetic polyneuropathy Modifier: osed Quantity: CPT4 Code: [id number] Description: 11042 - WC PHYS SUBQ  TISS 20 SQ CM Modifier: Quantity: 1 CPT4 Code: Description: ICD-10 Diagnosis Description L97.522 Non-pressure chronic ulcer of other part of left foot with fat layer exp Modifier: osed Quantity: Electronic Signature(s) Signed: 07/23/2022 10:42:00 AM By: Worthy Keeler PA-C Entered By: Worthy Keeler on 07/23/2022 10:41:59

## 2022-07-24 NOTE — Progress Notes (Signed)
[name], [name] ([id number]) Visit Report for 07/23/2022 Arrival Information Details Patient Name: [name], [name]. Date of Service: 07/23/2022 9:15 AM Medical Record Number: [id number] Patient Account Number: [id number] Date of Birth/Sex: [birth year] (76 y.o. M) Treating [name]: [name] Primary Care [name]: [name] Other Clinician: Referring [name]: [name] Treating [name]/Extender: [name] in Treatment: 4 Visit Information History Since Last Visit Added or deleted any medications: No Patient Arrived: Cane Has Dressing in Place as Prescribed: Yes Arrival Time: 09:27 Has Footwear/Offloading in Place as Prescribed: Yes Accompanied By: self Left: Wedge Shoe Transfer Assistance: None Pain Present Now: No Patient Identification Verified: Yes Secondary Verification Process Completed: Yes Patient Requires Transmission-Based No Precautions: Patient Has Alerts: Yes Patient Alerts: Patient on Blood Thinner Warfarin Type II Diabetic Electronic Signature(s) Signed: 07/24/2022 3:11:48 PM By: [name], BSN, [name], CWS, [name], BSN Entered By: [name], BSN, [name], CWS, [name] on 07/23/2022 09:27:55 [name] ([id number]) -------------------------------------------------------------------------------- Encounter Discharge Information Details Patient Name: [name], [name]. Date of Service: 07/23/2022 9:15 AM Medical Record Number: [id number] Patient Account Number: [id number] Date of Birth/Sex: 10-12-1946 (76 y.o. M) Treating [name]: [name] Primary Care [name]: [name] Other Clinician: Referring Chantelle Verdi: [name] Treating [name]/Extender: [name] in Treatment: 4 Encounter Discharge Information Items Post Procedure Vitals Discharge Condition: Stable Temperature (F): 98.3 Ambulatory Status: Cane Pulse (bpm): 81 Discharge Destination: Home Respiratory Rate (breaths/min): 16 Transportation: Private Auto Blood Pressure (mmHg):  124/62 Accompanied By: self Schedule Follow-up Appointment: Yes Clinical Summary of Care: Electronic Signature(s) Signed: 07/24/2022 3:11:48 PM By: [name], BSN, [name], CWS, [name], BSN Entered By: [name], BSN, [name], CWS, [name] on 07/23/2022 10:13:11 [name] ([id number]) -------------------------------------------------------------------------------- Lower Extremity Assessment Details Patient Name: [name], [name]. Date of Service: 07/23/2022 9:15 AM Medical Record Number: [id number] Patient Account Number: [id number] Date of Birth/Sex: 05-02-1946 (76 y.o. M) Treating [name]: [name] Primary Care [name]: [name] Other Clinician: Referring [name]: [name] Treating [name]/Extender: [name] in Treatment: 4 Edema Assessment Assessed: [Left: Yes] [Right: No] Edema: [Left: Ye] [Right: s] Calf Left: Right: Point of Measurement: 30 cm From Medial Instep 4.6 cm Ankle Left: Right: Point of Measurement: 10 cm From Medial Instep 26.8 cm Vascular Assessment Pulses: Dorsalis Pedis Palpable: [Left:Yes] Electronic Signature(s) Signed: 07/24/2022 3:11:48 PM By: [name], BSN, [name], CWS, [name], BSN Entered By: [name], BSN, [name], CWS, [name] on 07/23/2022 09:37:31 [name] ([id number]) -------------------------------------------------------------------------------- Multi Wound Chart Details Patient Name: [name], [name]. Date of Service: 07/23/2022 9:15 AM Medical Record Number: [id number] Patient Account Number: [id number] Date of Birth/Sex: [birth year] (76 y.o. 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: Pressure Ulcer N/A N/A Comorbid History: Cataracts, Arrhythmia, Coronary N/A N/A Artery Disease, Hypertension, Type II Diabetes, Osteoarthritis, Neuropathy Date Acquired: 06/05/2022 N/A N/A Weeks of Treatment: 4 N/A N/A Wound Status: Open N/A N/A Wound Recurrence: No N/A N/A Measurements L x W x D (cm) 1.6x2.5x0.2 N/A N/A Area (cm) : 3.142 N/A N/A Volume (cm) : 0.628 N/A N/A % Reduction in Area: -122.20% N/A N/A % Reduction in Volume: -121.90% N/A N/A Classification: Grade 1 N/A N/A Exudate Amount: Medium  N/A N/A Exudate Type: Serous N/A N/A Exudate Color: amber N/A N/A Wound Margin: Flat and Intact N/A N/A Granulation Amount: Medium (34-66%) N/A N/A Necrotic Amount: Medium (34-66%) N/A N/A Exposed Structures: Fat Layer (Subcutaneous Tissue): N/A N/A Yes Fascia: No Tendon: No Muscle: No Joint: No Bone: No Epithelialization: None N/A N/A Assessment Notes: Dried skin and callus around N/A N/A wound. Treatment Notes Electronic Signature(s) Signed: 07/24/2022 3:11:48 PM By: [name], BSN, [name], CWS, [name], BSN [name], [name] ([id number]) Entered By: [name], BSN, [name], CWS, [name] on 07/23/2022 09:51:41 [name] ([id number]) -------------------------------------------------------------------------------- [place] Details Patient Name: [name], [name]. Date of Service: 07/23/2022 9:15 AM Medical Record Number: [id number] Patient Account Number: [id number] Date of Birth/Sex: [birth year] (76 y.o. M) Treating [name]: [name] Primary Care [name]: [name] Other Clinician: Referring [name]: [name] Treating [name]/Extender: [name] in Treatment: 4 Active Inactive Pressure Nursing Diagnoses: Knowledge deficit related to causes and risk factors for pressure ulcer development Knowledge  deficit related to management of pressures ulcers Potential for impaired tissue integrity related to pressure, friction, moisture, and shear Goals: Patient will remain free from development of additional pressure ulcers Date Initiated: 06/19/2022 Target Resolution Date: 06/19/2022 Goal Status: Active Patient/caregiver will verbalize risk factors for pressure ulcer development Date Initiated: 06/19/2022 Target Resolution Date: 06/19/2022 Goal Status: Active Patient/caregiver will verbalize understanding of pressure ulcer management Date Initiated: 06/19/2022 Target Resolution Date: 06/19/2022 Goal Status: Active Interventions: Assess: immobility, friction, shearing, incontinence upon admission and as needed Assess offloading mechanisms upon admission and as needed Assess potential for pressure ulcer upon admission and as needed Provide education on pressure ulcers Notes: Wound/Skin Impairment Nursing Diagnoses: Impaired tissue integrity Goals: Patient/caregiver will verbalize understanding of skin care regimen Date Initiated: 06/19/2022 Date Inactivated: 07/10/2022 Target Resolution Date: 06/19/2022 Goal Status: Met Ulcer/skin breakdown will have a volume reduction of 30% by week 4 Date Initiated: 06/19/2022 Target Resolution Date: 07/17/2022 Goal Status: Active Interventions: Assess patient/caregiver ability to obtain necessary supplies Assess patient/caregiver ability to perform ulcer/skin care regimen upon admission and as needed Assess ulceration(s) every visit Provide education on ulcer and skin care Treatment Activities: Skin care regimen initiated : 06/19/2022 Topical wound management initiated : 06/19/2022 Notes: Electronic Signature(s) [name], [name] ([id number]) Signed: 07/24/2022 3:11:48 PM By: [name], BSN, [name], CWS, [name], BSN Entered By: [name], BSN, [name], CWS, [name] on 07/23/2022 09:38:23 [name]  ([id number]) -------------------------------------------------------------------------------- Pain Assessment Details Patient Name: [name], [name]. Date of Service: 07/23/2022 9:15 AM Medical Record Number: [id number] Patient Account Number: [id number] Date of Birth/Sex: Jul 04, 1946 (76 y.o. M) Treating [name]: [name] Primary Care Delia Slatten: [name] Other Clinician: Referring Kegan Shepardson: [name] Treating Leilanee Righetti/Extender: [name] in Treatment: 4 Active Problems Location of Pain Severity and Description of Pain Patient Has Paino No Site Locations Pain Management and Medication Current Pain Management: Notes Patient denies pain at this time. Electronic Signature(s) Signed: 07/24/2022 3:11:48 PM By: [name], BSN, [name], CWS, [name], BSN Entered By: [name], BSN, [name], CWS, [name] on 07/23/2022 09:28:26 [name] ([id number]) -------------------------------------------------------------------------------- Patient/Caregiver Education Details Patient Name: [name], [name]. Date of Service: 07/23/2022 9:15 AM Medical Record Number: [id number] Patient Account Number: [id number] Date of Birth/Gender: 1946-06-20 (76 y.o. M) Treating [name]: [name] Primary Care Physician: [name] Other Clinician: Referring Physician: Fulton [name] Treating Physician/Extender: [name] in Treatment: 4 Education Assessment Education Provided To: Patient Education Topics Provided Offloading: Handouts: What is Offloadingo, Other: casting next week Wound/Skin Impairment: Handouts: Caring  for Your Ulcer, Other: continue wound care as prescribed Electronic Signature(s) Signed: 07/24/2022 3:11:48 PM By: [name], BSN, [name], CWS, [name], BSN Entered By: [name], BSN, [name], CWS, [name] on 07/23/2022 10:12:24 [name] ([id number]) -------------------------------------------------------------------------------- Wound Assessment Details Patient Name: [name], [name]. Date  of Service: 07/23/2022 9:15 AM Medical Record Number: [id number] Patient Account Number: [id number] Date of Birth/Sex: 10/22/1945 (76 y.o. M) Treating [name]: [name] Primary Care Henryk Ursin: [name] Other Clinician: Referring Mao Lockner: [name] Treating Lacara Dunsworth/Extender: [name] in Treatment: 4 Wound Status Wound Number: 10 Primary Diabetic Wound/Ulcer of the Lower Extremity Etiology: Wound Location: Left Calcaneus Secondary Pressure Ulcer Wounding Event: Gradually Appeared Etiology: Date Acquired: 06/05/2022 Wound Open Weeks Of Treatment: 4 Status: Clustered Wound: No Comorbid Cataracts, Arrhythmia, Coronary Artery Disease, History: Hypertension, Type II Diabetes, Osteoarthritis, Neuropathy Photos Photo Uploaded By: [name], BSN, [name], CWS, [name] on 07/23/2022 09:39:03 Wound Measurements Length: (cm) 1.6 Width: (cm) 2.5 Depth: (cm) 0.2 Area: (cm) 3.142 Volume: (cm) 0.628 % Reduction in Area: -122.2% % Reduction in Volume: -121.9% Epithelialization: None Wound Description Classification: Grade 1 Wound Margin: Flat and Intact Exudate Amount: Medium Exudate Type: Serous Exudate Color: amber Foul Odor After Cleansing: No Slough/Fibrino Yes Wound Bed Granulation Amount: Medium (34-66%) Exposed Structure Necrotic Amount: Medium (34-66%) Fascia Exposed: No Fat Layer (Subcutaneous Tissue) Exposed: Yes Tendon Exposed: No Muscle Exposed: No Joint Exposed: No Bone Exposed: No Assessment Notes Dried skin and callus around wound. Treatment Notes Wound #10 (Calcaneus) Wound Laterality: Left [name], [name] ([id number]) Cleanser Peri-Wound Care Topical Primary Dressing Prisma 4.34 (in) Discharge Instruction: Moisten w/normal saline or sterile water; Cover wound as directed. Do not remove from wound bed. Secondary Dressing ABD Pad 5x9 (in/in) Discharge Instruction: Cover with ABD pad Secured With Coban Cohesive Bandage 4x5 (yds)  Stretched Discharge Instruction: Apply Coban as directed. Compression Wrap Compression Stockings Add-Ons Electronic Signature(s) Signed: 07/24/2022 3:11:48 PM By: [name], BSN, [name], CWS, [name], BSN Entered By: [name], BSN, [name], CWS, [name] on 07/23/2022 09:35:44 [name] ([id number]) -------------------------------------------------------------------------------- Vitals Details Patient Name: [name], [name]. Date of Service: 07/23/2022 9:15 AM Medical Record Number: [id number] Patient Account Number: [id number] Date of Birth/Sex: 04-[name]-1946 (76 y.o. M) Treating [name]: [name] Primary Care Arli Bree: [name] Other Clinician: Referring Yoel Kaufhold: [name] Treating Danelly Hassinger/Extender: [name] in Treatment: 4 Vital Signs Time Taken: 09:27 Temperature (F): 98.3 Height (in): 70 Pulse (bpm): 81 Weight (lbs): 265 Respiratory Rate (breaths/min): 16 Body Mass Index (BMI): 38 Blood Pressure (mmHg): 124/62 Reference Range: 80 - 120 mg / dl Electronic Signature(s) Signed: 07/24/2022 3:11:48 PM By: [name], BSN, [name], CWS, [name], BSN Entered By: [name], BSN, [name], CWS, [name] on 07/23/2022 68:25:74

## 2022-07-26 LAB — AEROBIC CULTURE W GRAM STAIN (SUPERFICIAL SPECIMEN)

## 2022-07-28 ENCOUNTER — Encounter: Payer: Medicare HMO | Admitting: Physician Assistant

## 2022-07-28 DIAGNOSIS — E11621 Type 2 diabetes mellitus with foot ulcer: Secondary | ICD-10-CM | POA: Diagnosis not present

## 2022-07-28 NOTE — Progress Notes (Addendum)
[name] ([id number]) Visit Report for 07/28/2022 Arrival Information Details Patient Name: [name], [name]. Date of Service: 07/28/2022 9:15 AM Medical Record Number: [id number] Patient Account Number: [id number] Date of Birth/Sex: 1945/12/29 (76 y.o. M) Treating RN: [name] Primary Care Darlen [name]: [name] Other Clinician: Referring [name]: [name] Treating [name]/Extender: [name] in Treatment: 5 Visit Information History Since Last Visit Added or deleted any medications: No Patient Arrived: [name] Any new allergies or adverse reactions: No Arrival Time: 09:07 Had a fall or experienced change in No Accompanied By: self activities of daily living that may affect Transfer Assistance: None risk of falls: Patient Identification Verified: Yes Signs or symptoms of abuse/neglect since last visito No Secondary Verification Process Completed: Yes Hospitalized since last visit: No Patient Requires Transmission-Based No Implantable device outside of the clinic excluding No Precautions: cellular tissue based products placed in the center Patient Has Alerts: Yes since last visit: Patient Alerts: Patient on Blood Has Dressing in Place as Prescribed: Yes Thinner Pain Present Now: No Warfarin Type II Diabetic Electronic Signature(s) Signed: 07/28/2022 5:00:21 PM By: [name] Gouty RN, BSN Entered By: [name] on 07/28/2022 09:13:57 [name] ([id number]) -------------------------------------------------------------------------------- Clinic Level of Care Assessment Details Patient Name: [name], [name]. Date of Service: 07/28/2022 9:15 AM Medical Record Number: [id number] Patient Account Number: [id number] Date of Birth/Sex: [birth year] (76 y.o. M) Treating RN: [name] Primary Care Dawnn Nam: [name] Other Clinician: Referring [name]: [name] Treating [name]/Extender: [name] in  Treatment: 5 Clinic Level of Care Assessment Items TOOL 4 Quantity Score '[]'  - Use when only an EandM is performed on FOLLOW-UP visit 0 ASSESSMENTS - Nursing Assessment / Reassessment X - Reassessment of Co-morbidities (includes updates in patient status) 1 10 X- 1 5 Reassessment of Adherence to Treatment Plan ASSESSMENTS - Wound and Skin Assessment / Reassessment '[]'  - Simple Wound Assessment / Reassessment - one wound 0 X- 2 5 Complex Wound Assessment / Reassessment - multiple wounds '[]'  - 0 Dermatologic / Skin Assessment (not related to wound area) ASSESSMENTS - Focused Assessment X - Circumferential Edema Measurements - multi extremities 2 5 '[]'  - 0 Nutritional Assessment / Counseling / Intervention X- 1 5 Lower Extremity Assessment (monofilament, tuning fork, pulses) '[]'  - 0 Peripheral Arterial Disease Assessment (using hand held doppler) ASSESSMENTS - Ostomy and/or Continence Assessment and Care '[]'  - Incontinence Assessment and Management 0 '[]'  - 0 Ostomy Care Assessment and Management (repouching, etc.) PROCESS - Coordination of Care X - Simple Patient / Family Education for ongoing care 1 15 '[]'  - 0 Complex (extensive) Patient / Family Education for ongoing care X- 1 10 Staff obtains Programmer, systems, Records, Test Results / Process Orders '[]'  - 0 Staff telephones HHA, Nursing Homes / Clarify orders / etc '[]'  - 0 Routine Transfer to another Facility (non-emergent condition) '[]'  - 0 Routine Hospital Admission (non-emergent condition) '[]'  - 0 New Admissions / Biomedical engineer / Ordering NPWT, Apligraf, etc. '[]'  - 0 Emergency Hospital Admission (emergent condition) X- 1 10 Simple Discharge Coordination '[]'  - 0 Complex (extensive) Discharge Coordination PROCESS - Special Needs '[]'  - Pediatric / Minor Patient Management 0 '[]'  - 0 Isolation Patient Management '[]'  - 0 Hearing / Language / Visual special needs '[]'  - 0 Assessment of Community assistance (transportation, D/C  planning, etc.) '[]'  - 0 Additional assistance / Altered mentation '[]'  - 0 Support Surface(s) Assessment (bed, cushion, seat, etc.) INTERVENTIONS - Wound Cleansing / Measurement [name] ([id number]) '[]'  - 0 Simple Wound  Cleansing - one wound X- 2 5 Complex Wound Cleansing - multiple wounds X- 1 5 Wound Imaging (photographs - any number of wounds) '[]'  - 0 Wound Tracing (instead of photographs) '[]'  - 0 Simple Wound Measurement - one wound X- 2 5 Complex Wound Measurement - multiple wounds INTERVENTIONS - Wound Dressings X - Small Wound Dressing one or multiple wounds 2 10 '[]'  - 0 Medium Wound Dressing one or multiple wounds '[]'  - 0 Large Wound Dressing one or multiple wounds '[]'  - 0 Application of Medications - topical '[]'  - 0 Application of Medications - injection INTERVENTIONS - Miscellaneous '[]'  - External ear exam 0 '[]'  - 0 Specimen Collection (cultures, biopsies, blood, body fluids, etc.) '[]'  - 0 Specimen(s) / Culture(s) sent or taken to Lab for analysis '[]'  - 0 Patient Transfer (multiple staff / Civil Service fast streamer / Similar devices) '[]'  - 0 Simple Staple / Suture removal (25 or less) '[]'  - 0 Complex Staple / Suture removal (26 or more) '[]'  - 0 Hypo / Hyperglycemic Management (close monitor of Blood Glucose) '[]'  - 0 Ankle / Brachial Index (ABI) - do not check if billed separately X- 1 5 Vital Signs Has the patient been seen at the hospital within the last three years: Yes Total Score: 125 Level Of Care: New/Established - Level 4 Electronic Signature(s) Signed: 07/28/2022 5:00:21 PM By: [name] Gouty RN, BSN Entered By: [name] on 07/28/2022 [name] ([id number]) -------------------------------------------------------------------------------- Encounter Discharge Information Details Patient Name: [name], [name]. Date of Service: 07/28/2022 9:15 AM Medical Record Number: [id number] Patient Account Number: [id number] Date of Birth/Sex:  Jul 19, 1946 (76 y.o. M) Treating RN: [name] Primary Care [name]: [name] Other Clinician: Referring [name]: [name] Treating Larena Ohnemus/Extender: [name] in Treatment: 5 Encounter Discharge Information Items Discharge Condition: Stable Ambulatory Status: Walker Discharge Destination: Home Transportation: Private Auto Accompanied By: self Schedule Follow-up Appointment: Yes Clinical Summary of Care: Patient Declined Electronic Signature(s) Signed: 07/28/2022 5:00:21 PM By: [name] Gouty RN, BSN Entered By: [name] on 07/28/2022 09:41:33 [name] ([id number]) -------------------------------------------------------------------------------- Lower Extremity Assessment Details Patient Name: [name], [name]. Date of Service: 07/28/2022 9:15 AM Medical Record Number: [id number] Patient Account Number: [id number] Date of Birth/Sex: 1946/07/16 (76 y.o. M) Treating RN: [name] Primary Care Amparo Donalson: [name] Other Clinician: Referring Zaniyah Wernette: [name] Treating Armida Vickroy/Extender: [name] in Treatment: 5 Edema Assessment Assessed: [Left: No] [Right: No] Edema: [Left: Yes] [Right: Yes] Calf Left: Right: Point of Measurement: From Medial Instep 42 cm 40.5 cm Ankle Left: Right: Point of Measurement: From Medial Instep 23.5 cm 22.5 cm Vascular Assessment Pulses: Dorsalis Pedis Palpable: [Left:No] Electronic Signature(s) Signed: 07/28/2022 5:00:21 PM By: [name] Gouty RN, BSN Entered By: [name] on 07/28/2022 09:29:16 [name] ([id number]) -------------------------------------------------------------------------------- Multi Wound Chart Details Patient Name: [name], [name]. Date of Service: 07/28/2022 9:15 AM Medical Record Number: [id number] Patient Account Number: [id number] Date of Birth/Sex: November 11, 1945 (76 y.o. 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 Diabetic Wound/Ulcer of the Lower N/A Extremity Comorbid History: Cataracts, Arrhythmia, Coronary Cataracts, Arrhythmia, Coronary N/A Artery Disease,  Hypertension, Type Artery Disease, Hypertension, Type II Diabetes, Osteoarthritis, II Diabetes, Osteoarthritis, Neuropathy Neuropathy Date Acquired: 06/05/2022 07/28/2022 N/A Weeks of Treatment: 5 0 N/A Wound Status: Open Open N/A Wound Recurrence: No No N/A Measurements L x W x D (cm) 1.8x2.6x0.1 2.7x2x0.1 N/A Area (cm) : 3.676 4.241 N/A Volume (cm) : 0.368 0.424 N/A % Reduction in Area: -160.00% N/A N/A % Reduction in Volume: -30.00% N/A N/A Classification: Grade 1 Full Thickness Without Exposed N/A Support Structures Exudate Amount: Medium Medium N/A Exudate Type: Purulent Serosanguineous N/A Exudate Color: yellow, brown, green red, brown N/A Wound Margin: Flat and Intact Flat and Intact N/A Granulation Amount: Small (1-33%) Large (67-100%) N/A Granulation Quality: Pink Red N/A Necrotic Amount: Large (67-100%) None Present (0%) N/A Exposed Structures: Fat Layer (Subcutaneous Tissue): Fat Layer (Subcutaneous Tissue): N/A Yes Yes Fascia: No Fascia: No Tendon: No Tendon: No Muscle: No Muscle: No Joint: No Joint: No Bone: No Bone: No Epithelialization: Small (1-33%) Small (1-33%) N/A Treatment Notes Electronic Signature(s) ZEEV, DEAKINS ([id number]) Signed: 07/28/2022  5:00:21 PM By: [name] Gouty RN, BSN Entered By: [name] on 07/28/2022 09:30:51 [name] ([id number]) -------------------------------------------------------------------------------- Multi-Disciplinary Care Plan Details Patient Name: [name], [name]. Date of Service: 07/28/2022 9:15 AM Medical Record Number: [id number] Patient Account Number: [id number] Date of Birth/Sex: 1946-06-23 (76 y.o. M) Treating RN: [name] Primary Care Archie Atilano: [name] Other Clinician: Referring Collene Massimino: [name] Treating Arien Morine/Extender: [name] in Treatment: 5 Active Inactive Pressure Nursing Diagnoses: Knowledge deficit related to causes and risk factors for pressure ulcer development Knowledge deficit related to management of pressures ulcers Potential for impaired tissue integrity related to pressure, friction, moisture, and shear Goals: Patient will remain free from development of additional pressure ulcers Date Initiated: 06/19/2022 Date Inactivated: 07/28/2022 Target Resolution Date: 06/19/2022 Goal Status: Met Patient/caregiver will verbalize risk factors for pressure ulcer development Date Initiated: 06/19/2022 Date Inactivated: 07/28/2022 Target Resolution Date: 06/19/2022 Goal Status: Met Patient/caregiver will verbalize understanding of pressure ulcer management Date Initiated: 06/19/2022 Target Resolution Date: 08/14/2022 Goal Status: Active Interventions: Assess: immobility, friction, shearing, incontinence upon admission and as needed Assess offloading mechanisms upon admission and as needed Assess potential for pressure ulcer upon admission and as needed Provide education on pressure ulcers Notes: Wound/Skin Impairment Nursing Diagnoses: Impaired tissue integrity Goals: Patient/caregiver will verbalize understanding of skin care regimen Date Initiated: 06/19/2022 Date Inactivated: 07/10/2022 Target Resolution Date: 06/19/2022 Goal  Status: Met Ulcer/skin breakdown will have a volume reduction of 30% by week 4 Date Initiated: 06/19/2022 Date Inactivated: 07/28/2022 Target Resolution Date: 07/17/2022 Goal Status: Unmet Unmet Reason: infection Ulcer/skin breakdown will have a volume reduction of 50% by week 8 Date Initiated: 07/28/2022 Target Resolution Date: 08/14/2022 Goal Status: Active Interventions: Assess patient/caregiver ability to obtain necessary supplies Assess patient/caregiver ability to perform ulcer/skin care regimen upon admission and as needed Assess ulceration(s) every visit Provide education on ulcer and skin care Treatment Activities: Skin care regimen initiated : 06/19/2022 Topical wound management initiated : 06/19/2022 Notes: RYOTA, TREECE ([id number]) Electronic Signature(s) Signed: 07/28/2022 5:00:21 PM By: [name] Gouty RN, BSN Entered By: [name] on 07/28/2022 09:32:35 JOHNTE, PORTNOY ([id number]) -------------------------------------------------------------------------------- Pain Assessment Details Patient Name: [name], [name]. Date of Service: 07/28/2022 9:15 AM Medical Record Number: [id number] Patient Account Number: [id number] Date of Birth/Sex: 1945-11-01 (76 y.o. M) Treating RN: [name] Primary Care Jillianne Gamino: [name] Other Clinician: Referring Paden Senger: [name] Treating Jamayah Myszka/Extender: [name] in Treatment: 5 Active Problems Location of Pain Severity and Description  of Pain Patient Has Paino No Site Locations Rate the pain. Current Pain Level: 0 Pain Management and Medication Current Pain Management: Electronic Signature(s) Signed: 07/28/2022 5:00:21 PM By: [name] Gouty RN, BSN Entered By: [name] on 07/28/2022 09:15:00 [name] ([id number]) -------------------------------------------------------------------------------- Patient/Caregiver Education Details Patient Name: [name], [name]. Date of  Service: 07/28/2022 9:15 AM Medical Record Number: [id number] Patient Account Number: [id number] Date of Birth/Gender: 03-23-1946 (75 y.o. M) Treating RN: [name] Primary Care Physician: [name] Other Clinician: Referring Physician: Fulton [name] Treating Physician/Extender: [name] in Treatment: 5 Education Assessment Education Provided To: Patient Education Topics Provided Offloading: Methods: Explain/Verbal Responses: Reinforcements needed, State content correctly Pressure: Methods: Explain/Verbal Responses: Reinforcements needed, State content correctly Wound/Skin Impairment: Methods: Explain/Verbal Responses: Reinforcements needed, State content correctly Electronic Signature(s) Signed: 07/28/2022 5:00:21 PM By: [name] Gouty RN, BSN Entered By: [name] on 07/28/2022 09:31:20 DEONTRAE, DRINKARD ([id number]) -------------------------------------------------------------------------------- Wound Assessment Details Patient Name: [name], [name]. Date of Service: 07/28/2022 9:15 AM Medical Record Number: [id number] Patient Account Number: [id number] Date of Birth/Sex: September 03, 1946 (76 y.o. M) Treating RN: [name] Primary Care Macintyre [name]: [name] Other Clinician: Referring Midas Daughety: [name] Treating Leida Luton/Extender: [name] in Treatment: 5 Wound Status Wound Number: 10 Primary Diabetic Wound/Ulcer of the Lower Extremity Etiology: Wound Location: Left Calcaneus Secondary Pressure Ulcer Wounding Event: Gradually Appeared Etiology: Date Acquired: 06/05/2022 Wound Open Weeks Of Treatment: 5 Status: Clustered Wound: No Comorbid Cataracts, Arrhythmia, Coronary Artery Disease, History: Hypertension, Type II Diabetes, Osteoarthritis, Neuropathy Photos Wound Measurements Length: (cm) 1.8 % Reduc Width: (cm) 2.6 % Reduc Depth: (cm) 0.1 Epithel Area: (cm) 3.676 Tunnel Volume: (cm) 0.368 Underm tion in  Area: -160% tion in Volume: -30% ialization: Small (1-33%) ing: No ining: No Wound Description Classification: Grade 1 Foul O Wound Margin: Flat and Intact Slough Exudate Amount: Medium Exudate Type: Purulent Exudate Color: yellow, brown, green dor After Cleansing: No /Fibrino Yes Wound Bed Granulation Amount: Small (1-33%) Exposed Structure Granulation Quality: Pink Fascia Exposed: No Necrotic Amount: Large (67-100%) Fat Layer (Subcutaneous Tissue) Exposed: Yes Necrotic Quality: Adherent Slough Tendon Exposed: No Muscle Exposed: No Joint Exposed: No Bone Exposed: No Treatment Notes Wound #10 (Calcaneus) Wound Laterality: Left Cleanser Peri-Wound Care WASH, NIENHAUS ([id number]) Topical Primary Dressing Prisma 4.34 (in) Discharge Instruction: Moisten w/normal saline or sterile water; Cover wound as directed. Do not remove from wound bed. Secondary Dressing ABD Pad 5x9 (in/in) Discharge Instruction: Cover with ABD pad Secured With Coban Cohesive Bandage 4x5 (yds) Stretched Discharge Instruction: Apply Coban as directed. Compression Wrap Compression Stockings Add-Ons Electronic Signature(s) Signed: 07/28/2022 5:00:21 PM By: [name] Gouty RN, BSN Entered By: [name] on 07/28/2022 15:72:62 RENLY, ROOTS ([id number]) -------------------------------------------------------------------------------- Wound Assessment Details Patient Name: [name], [name]. Date of Service: 07/28/2022 9:15 AM Medical Record Number: [id number] Patient Account Number: [id number] Date of Birth/Sex: [birth year] (76 y.o. M) Treating RN: [name] Primary Care Lucely Leard: [name] Other Clinician: Referring Saudia Smyser: [name] Treating Sherl Yzaguirre/Extender: [name] in Treatment: 5 Wound Status Wound Number: 12 Primary Venous Leg Ulcer Etiology: Wound Location: Right, Anterior Lower Leg Secondary Diabetic Wound/Ulcer of the Lower Extremity Wounding  Event: Blister Etiology: Date Acquired: 07/28/2022 Wound Open Weeks Of Treatment: 0 Status: Clustered Wound: No Comorbid Cataracts, Arrhythmia, Coronary Artery Disease, History: Hypertension, Type II Diabetes, Osteoarthritis, Neuropathy Photos Wound Measurements Length: (cm) 2.7 Width: (cm) 2 Depth: (cm) 0.1 Area: (cm) 4.241 Volume: (cm) 0.424 % Reduction in Area: % Reduction in Volume: Epithelialization: Small (1-33%)  Tunneling: No Undermining: No Wound Description Classification: Full Thickness Without Exposed Support Structu Wound Margin: Flat and Intact Exudate Amount: Medium Exudate Type: Serosanguineous Exudate Color: red, brown res Foul Odor After Cleansing: No Slough/Fibrino No Wound Bed Granulation Amount: Large (67-100%) Exposed Structure Granulation Quality: Red Fascia Exposed: No Necrotic Amount: None Present (0%) Fat Layer (Subcutaneous Tissue) Exposed: Yes Tendon Exposed: No Muscle Exposed: No Joint Exposed: No Bone Exposed: No Treatment Notes Wound #12 (Lower Leg) Wound Laterality: Right, Anterior Cleanser Peri-Wound Care ERRIN, CHEWNING ([id number]) Topical Primary Dressing Prisma 4.34 (in) Discharge Instruction: Moisten w/normal saline or sterile water; Cover wound as directed. Do not remove from wound bed. Secondary Dressing Bordered Gauze Sterile-HBD 4x4 (in/in) Discharge Instruction: Cover wound with Bordered Guaze Sterile as directed Secured With Compression Wrap Compression Stockings Add-Ons Electronic Signature(s) Signed: 07/28/2022 5:00:21 PM By: [name] Gouty RN, BSN Entered By: [name] on 07/28/2022 09:28:13 [name], [name] ([id number]) -------------------------------------------------------------------------------- Vitals Details Patient Name: [name], [name]. Date of Service: 07/28/2022 9:15 AM Medical Record Number: [id number] Patient Account Number: [id number] Date of Birth/Sex: 1946/05/14 (76 y.o.  M) Treating RN: [name] Primary Care Temiloluwa Recchia: [name] Other Clinician: Referring Iyonnah Ferrante: [name] Treating Kipling Graser/Extender: [name] in Treatment: 5 Vital Signs Time Taken: 09:14 Temperature (F): 98.7 Height (in): 70 Pulse (bpm): 76 Weight (lbs): 265 Respiratory Rate (breaths/min): 18 Body Mass Index (BMI): 38 Blood Pressure (mmHg): 170/76 Reference Range: 80 - 120 mg / dl Electronic Signature(s) Signed: 07/28/2022 5:00:21 PM By: [name] Gouty RN, BSN Entered By: [name] on 07/28/2022 09:14:25

## 2022-07-28 NOTE — Progress Notes (Signed)
CRISTAN, SCHERZER ([id number]) Visit Report for 07/28/2022 Chief Complaint Document Details Patient Name: [name], [name]. Date of Service: 07/28/2022 9:15 AM Medical Record Number: [id number] Patient Account Number: [id number] Date of Birth/Sex: 10-30-1945 (76 y.o. M) Treating RN: Baruch Gouty Primary Care Provider: Fulton Reek Other Clinician: Referring Provider: Fulton Reek Treating Provider/Extender: Skipper Cliche in Treatment: 5 Information Obtained from: Patient Chief Complaint Left heel ulcer Electronic Signature(s) Signed: 07/28/2022 9:03:42 AM By: Worthy Keeler PA-C Entered By: Worthy Keeler on 07/28/2022 09:03:42 [name] ([id number]) -------------------------------------------------------------------------------- Problem List Details Patient Name: FAVOR, [name]. Date of Service: 07/28/2022 9:15 AM Medical Record Number: [id number] Patient Account Number: [id number] Date of Birth/Sex: 08-Nov-1945 (76 y.o. M) Treating RN: Baruch Gouty Primary Care Provider: Fulton Reek Other Clinician: Referring Provider: Fulton Reek Treating Provider/Extender: Skipper Cliche in Treatment: 5 Active Problems ICD-10 Encounter Code Description Active Date MDM Diagnosis E11.621 Type 2 diabetes mellitus with foot ulcer 06/19/2022 No Yes L97.522 Non-pressure chronic ulcer of other part of left foot with fat layer 06/19/2022 No Yes exposed E11.42 Type 2 diabetes mellitus with diabetic polyneuropathy 06/19/2022 No Yes Inactive Problems Resolved Problems Electronic Signature(s) Signed: 07/28/2022 9:03:38 AM By: Worthy Keeler PA-C Entered By: Worthy Keeler on 07/28/2022 09:03:38

## 2022-07-31 ENCOUNTER — Ambulatory Visit: Payer: Medicare HMO | Admitting: Physician Assistant

## 2022-08-04 ENCOUNTER — Encounter: Payer: Medicare HMO | Admitting: Physician Assistant

## 2022-08-04 DIAGNOSIS — E11621 Type 2 diabetes mellitus with foot ulcer: Secondary | ICD-10-CM | POA: Diagnosis not present

## 2022-08-04 NOTE — Progress Notes (Signed)
[name], [name] ([id number]) [id number] Page 1 of 9 Visit Report for 08/04/2022 Arrival Information Details Patient Name: Date of Service: [name], [name] [facility] J. 08/04/2022 9:15 A M Medical Record Number: [id number] Patient Account Number: [id number] Date of Birth/Sex: Treating RN: 03/30/1946 (75 y.o. [name] Primary Care Ionia Schey: Fulton Reek Other Clinician: Referring Demaya Hardge: Treating Jehieli Brassell/Extender: [name] in Treatment: 6 Visit Information History Since Last Visit All ordered tests and consults were completed: No Patient Arrived: [name] Added or deleted any medications: No Arrival Time: 09:14 Any new allergies or adverse reactions: No Accompanied By: self Had a fall or experienced change in No Transfer Assistance: None activities of daily living that may affect Patient Identification Verified: Yes risk of falls: Secondary Verification Process Completed: Yes Signs or symptoms of abuse/neglect since last visito No Patient Requires Transmission-Based Precautions: No Hospitalized since last visit: No Patient Has Alerts: Yes Implantable device outside of the clinic excluding No Patient Alerts: Patient on Blood Thinner cellular tissue based products placed in the center Warfarin since last visit: Type II Diabetic Pain Present Now: No Electronic Signature(s) Signed: 08/06/2022 12:30:37 PM By: Rosalio Loud MSN RN CNS WTA Entered By: Rosalio Loud on 08/04/2022 09:18:33 -------------------------------------------------------------------------------- Encounter Discharge Information Details Patient Name: Date of Service: [name], [name] [name] 08/04/2022 9:15 A M Medical Record Number: [id number] Patient Account Number: [id number] Date of Birth/Sex: Treating RN: [birth year] (76 y.o. [name] Primary Care Joash Tony: Fulton Reek Other Clinician: Referring Mervyn Pflaum: Treating Avalin Briley/Extender: [name] in Treatment: 6 Encounter Discharge Information Items Post Procedure Vitals Discharge Condition: Stable Temperature (F): 98.3 Ambulatory Status: Cane Pulse (bpm): 84 Discharge Destination: Home Respiratory Rate (breaths/min): 16 Transportation: Private Auto Blood Pressure (mmHg): 138/63 Accompanied By: self Schedule Follow-up Appointment: No Clinical Summary of Care: [name], [name] ([id number]) [id number] Page 2 of 9 Electronic Signature(s) Signed: 08/06/2022 12:30:37 PM By: Rosalio Loud MSN RN CNS WTA Entered By: Rosalio Loud on 08/04/2022 09:53:34 -------------------------------------------------------------------------------- Lower Extremity Assessment Details Patient Name: Date of Service: [name], [name] [name] 08/04/2022 9:15 A M Medical Record Number: [id number] Patient Account Number: [id number] Date of Birth/Sex: Treating RN: 1945/12/04 (76 y.o. [name] Primary Care Vikrant Pryce: Fulton Reek Other Clinician: Referring Jeremie Abdelaziz: Treating Andres Vest/Extender: Maurilio Lovely Weeks in Treatment: 6 Edema Assessment Assessed: [Left: No] [Right: No] [Left: Edema] [Right: :] Calf Left: Right: Point of Measurement: 33 cm From Medial Instep 41 cm 40.5 cm Ankle Left: Right: Point of Measurement: 11 cm From Medial Instep 23.5 cm 24.2 cm Vascular Assessment Pulses: Dorsalis Pedis Palpable: [Left:Yes] [Right:Yes] Electronic Signature(s) Signed: 08/06/2022 12:30:37 PM By: Rosalio Loud MSN RN CNS WTA Entered By: Rosalio Loud on 08/04/2022 09:29:47 -------------------------------------------------------------------------------- Multi Wound Chart Details Patient Name: Date of Service: [name], [name] [name] 08/04/2022 9:15 A M Medical Record Number: [id number] Patient Account Number: [id number] Date of Birth/Sex: Treating RN: [birth year] (76 y.o. 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40% N/A % Reduction in Area: -91.50% 7.30% N/A % Reduction in Volume: Grade 1 Full Thickness Without Exposed N/A Classification: Support Structures Medium Medium N/A Exudate Amount: Serous Serosanguineous N/A Exudate Type: amber red, brown N/A Exudate Color: Flat and Intact Flat and Intact N/A Wound Margin: Small (1-33%) Large (67-100%) N/A Granulation Amount: Pink Red N/A Granulation Quality: Large (67-100%) None Present (0%) N/A Necrotic Amount: Fat Layer (Subcutaneous Tissue): Yes Fat Layer (Subcutaneous Tissue): Yes N/A Exposed Structures: Fascia: No Fascia: No Tendon: No Tendon: No Muscle: No Muscle: No Joint: No Joint: No Bone: No Bone: No Small (1-33%) Small (1-33%) N/A Epithelialization: Treatment  Notes Electronic Signature(s) Signed: 08/06/2022 12:30:37 PM By: Rosalio Loud MSN RN CNS WTA Entered By: Rosalio Loud on 08/04/2022 09:30:51 -------------------------------------------------------------------------------- Multi-Disciplinary Care Plan Details Patient Name: Date of Service: [name], [name] [name] 08/04/2022 9:15 A M Medical Record Number: [id number] Patient Account Number: [id number] SHADEN, LACHER ([id number]) [id number] Page 4 of 9 Date of Birth/Sex: Treating RN: 1945/12/21 (76 y.o. [name] Primary Care Khristen Cheyney: Fulton Reek Other Clinician: Referring Joby Richart: Treating Janiah Devinney/Extender: Maurilio Lovely Weeks in Treatment: 6 Active Inactive Pressure Nursing Diagnoses: Knowledge deficit related to causes and risk factors for pressure ulcer development Knowledge deficit related to management of pressures ulcers Potential for impaired tissue integrity related to pressure, friction, moisture, and shear Goals: Patient will remain free from development of additional pressure ulcers Date Initiated: 06/19/2022 Date Inactivated: 07/28/2022 Target Resolution Date: 06/19/2022 Goal Status: Met Patient/caregiver will verbalize risk factors for pressure ulcer development Date Initiated: 06/19/2022 Date Inactivated: 07/28/2022 Target Resolution Date: 06/19/2022 Goal Status: Met Patient/caregiver will verbalize understanding of pressure ulcer management Date Initiated: 06/19/2022 Target Resolution Date: 08/14/2022 Goal Status: Active Interventions: Assess: immobility, friction, shearing, incontinence upon admission and as needed Assess offloading mechanisms upon admission and as needed Assess potential for pressure ulcer upon admission and as needed Provide education on pressure ulcers Notes: Wound/Skin Impairment Nursing Diagnoses: Impaired tissue integrity Goals: Patient/caregiver will verbalize understanding of skin care  regimen Date Initiated: 06/19/2022 Date Inactivated: 07/10/2022 Target Resolution Date: 06/19/2022 Goal Status: Met Ulcer/skin breakdown will have a volume reduction of 30% by week 4 Date Initiated: 06/19/2022 Date Inactivated: 07/28/2022 Target Resolution Date: 07/17/2022 Goal Status: Unmet Unmet Reason: infection Ulcer/skin breakdown will have a volume reduction of 50% by week 8 Date Initiated: 07/28/2022 Target Resolution Date: 08/14/2022 Goal Status: Active Interventions: Assess patient/caregiver ability to obtain necessary supplies Assess patient/caregiver ability to perform ulcer/skin care regimen upon admission and as needed Assess ulceration(s) every visit Provide education on ulcer and skin care Treatment Activities: Skin care regimen initiated : 06/19/2022 Topical wound management initiated : 06/19/2022 Notes: Electronic Signature(s) Signed: 08/06/2022 12:30:37 PM By: Rosalio Loud MSN RN CNS WTA Entered By: Rosalio Loud on 08/04/2022 09:30:13 [name] ([id number]) [id number] Page 5 of 9 -------------------------------------------------------------------------------- Pain Assessment Details Patient Name: Date of Service: [name], [name] [name] 08/04/2022 9:15 A M Medical Record Number: [id number] Patient Account Number: [id number] Date of Birth/Sex: Treating RN: July 27, 1946 (76 y.o. [name] Primary Care Donisha Hoch: Fulton Reek Other Clinician: Referring Ivy Meriwether: Treating Starla Deller/Extender:  Stone, Hoyt Starbuck, Jeffrey Weeks in Treatment: 6 Active Problems Location of Pain Severity and Description of Pain Patient Has Paino No Site Locations With Dressing Change: No Rate the pain. Current Pain Level: 0 Pain Management and Medication Current Pain Management: Electronic Signature(s) Signed: 08/06/2022 12:30:37 PM By: Rosalio Loud MSN RN CNS WTA Entered By: Rosalio Loud on 08/04/2022  09:20:57 -------------------------------------------------------------------------------- Patient/Caregiver Education Details Patient Name: Date of Service: [name], [name] [name] 10/17/2023andnbsp9:15 A M Medical Record Number: [id number] Patient Account Number: [id number] Date of Birth/Gender: Treating RN: [birth year] (75 y.o. [name] Primary Care Physician: Fulton Reek Other Clinician: Referring Physician: Treating Physician/Extender: [name] in Treatment: 6 [name], [name] ([id number]) [id number] Page 6 of 9 Education Assessment Education Provided To: Patient Education Topics Provided Offloading: Handouts: What is Lexicographer, Other: Peg assist Research scientist (life sciences)) Signed: 08/06/2022 12:30:37 PM By: Rosalio Loud MSN RN CNS WTA Entered By: Rosalio Loud on 08/04/2022 09:46:59 -------------------------------------------------------------------------------- Wound Assessment Details Patient Name: Date of Service: [name], [name] [name] 08/04/2022 9:15 A M Medical Record Number: [id number] Patient Account Number: [id number] Date of Birth/Sex: Treating RN: [birth year] (76 y.o. [name] Primary Care Sonali Wivell: Fulton Reek Other Clinician: Referring Areanna Gengler: Treating Mikayah Joy/Extender: Maurilio Lovely Weeks in Treatment: 6 Wound Status Wound Number: 10 Primary Diabetic Wound/Ulcer of the Lower Extremity Etiology: Wound Location: Left Calcaneus Secondary Pressure Ulcer Wounding Event: Gradually Appeared Etiology: Date Acquired: 06/05/2022 Wound Open Weeks Of Treatment: 6 Status: Clustered Wound: No Comorbid Cataracts, Arrhythmia, Coronary Artery Disease, Hypertension, History: Type II Diabetes, Osteoarthritis, Neuropathy Photos Wound Measurements Length: (cm) 1.5 Width: (cm) 2.3 Depth: (cm) 0.2 Area: (cm) 2.71 Volume: (cm) 0.542 % Reduction in Area: -91.7% % Reduction in Volume:  -91.5% Epithelialization: Small (1-33%) Tunneling: No Undermining: No Wound Description Classification: Grade 1 Wound Margin: Flat and Intact Exudate Amount: Medium Exudate Type: Serous Exudate Color: amber [name], [name] ([id number]) Foul Odor After Cleansing: No Slough/Fibrino Yes [id number] Page 7 of 9 Wound Bed Granulation Amount: Small (1-33%) Exposed Structure Granulation Quality: Pink Fascia Exposed: No Necrotic Amount: Large (67-100%) Fat Layer (Subcutaneous Tissue) Exposed: Yes Necrotic Quality: Adherent Slough Tendon Exposed: No Muscle Exposed: No Joint Exposed: No Bone Exposed: No Treatment Notes Wound #10 (Calcaneus) Wound Laterality: Left Cleanser Peri-Wound Care Topical Primary Dressing Prisma 4.34 (in) Discharge Instruction: Moisten w/normal saline or sterile water; Cover wound as directed. Do not remove from wound bed. Secondary Dressing ABD Pad 5x9 (in/in) Discharge Instruction: Cover with ABD pad Secured With Coban Cohesive Bandage 4x5 (yds) Stretched Discharge Instruction: Apply Coban as directed. Compression Wrap Compression Stockings Add-Ons Electronic Signature(s) Signed: 08/06/2022 12:30:37 PM By: Rosalio Loud MSN RN CNS WTA Entered By: Rosalio Loud on 08/04/2022 09:26:26 -------------------------------------------------------------------------------- Wound Assessment Details Patient Name: Date of Service: [name], [name] [name] 08/04/2022 9:15 A M Medical Record Number: [id number] Patient Account Number: [id number] Date of Birth/Sex: Treating RN: 03-10-1946 (76 y.o. 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 Wounding Event:  Blister Etiology: Date Acquired: 07/28/2022 Wound Open Weeks Of Treatment: 1 Status: Clustered Wound: No Comorbid Cataracts, Arrhythmia, Coronary Artery Disease, Hypertension, History: Type II Diabetes, Osteoarthritis, Neuropathy Photos [name], [name] ([id number]) [id number] Page 8 of 9 Wound Measurements Length: (cm) 2 Width: (cm) 2.5 Depth: (cm) 0.1  Area: (cm) 3.927 Volume: (cm) 0.393 % Reduction in Area: 7.4% % Reduction in Volume: 7.3% Epithelialization: Small (1-33%) Wound Description Classification: Full Thickness Without Exposed Support Wound Margin: Flat and Intact Exudate Amount: Medium Exudate Type: Serosanguineous Exudate Color: red, brown Structures Foul Odor After Cleansing: No Slough/Fibrino No Wound Bed Granulation Amount: Large (67-100%) Exposed Structure Granulation Quality: Red Fascia Exposed: No Necrotic Amount: None Present (0%) Fat Layer (Subcutaneous Tissue) Exposed: Yes Tendon Exposed: No Muscle Exposed: No Joint Exposed: No Bone Exposed: No Treatment Notes Wound #12 (Lower Leg) Wound Laterality: Right, Anterior Cleanser Peri-Wound Care Topical Primary Dressing Prisma 4.34 (in) Discharge Instruction: Moisten w/normal saline or sterile water; Cover wound as directed. Do not remove from wound bed. Secondary Dressing Bordered [name] Sterile-HBD 4x4 (in/in) Discharge Instruction: Cover wound with Bordered Guaze Sterile as directed Secured With Compression Wrap Compression Stockings Add-Ons Electronic Signature(s) Signed: 08/06/2022 12:30:37 PM By: Rosalio Loud MSN RN CNS WTA Entered By: Rosalio Loud on 08/04/2022 09:27:45 [name] ([id number]) [id number] Page 9 of 9 -------------------------------------------------------------------------------- Vitals Details Patient Name: Date of Service: [name], [name] [name] 08/04/2022 9:15 A M Medical Record Number: [id number] Patient  Account Number: [id number] Date of Birth/Sex: Treating RN: 16-Oct-1946 (76 y.o. [name] Primary Care Norissa Bartee: Fulton Reek Other Clinician: Referring Olumide Dolinger: Treating Sulaiman Imbert/Extender: Maurilio Lovely Weeks in Treatment: 6 Vital Signs Time Taken: 09:18 Temperature (F): 98.3 Height (in): 70 Pulse (bpm): 84 Weight (lbs): 265 Respiratory Rate (breaths/min): 16 Body Mass Index (BMI): 38 Blood Pressure (mmHg): 138/63 Reference Range: 80 - 120 mg / dl Electronic Signature(s) Signed: 08/06/2022 12:30:37 PM By: Rosalio Loud MSN RN CNS WTA Entered By: Rosalio Loud on 08/04/2022 09:19:48

## 2022-08-04 NOTE — Progress Notes (Signed)
NILAY, MANGRUM ([id number]) [id number] Page 1 of 2 Visit Report for 08/04/2022 Chief Complaint Document Details Patient Name: Date of Service: [name], [name] [facility] J. 08/04/2022 9:15 A M Medical Record Number: [id number] Patient Account Number: [id number] Date of Birth/Sex: Treating RN: 01/01/1946 (76 y.o. [name] Primary Care Provider: Fulton Reek Other Clinician: Referring Provider: Treating Provider/Extender: Maurilio Lovely Weeks in Treatment: 6 Information Obtained from: Patient Chief Complaint Left heel ulcer Electronic Signature(s) Signed: 08/04/2022 9:16:58 AM By: Worthy Keeler PA-C Entered By: Worthy Keeler on 08/04/2022 09:16:58 -------------------------------------------------------------------------------- Problem List Details Patient Name: Date of Service: [name], [name] [name] 08/04/2022 9:15 A M Medical Record Number: [id number] Patient Account Number: [id number] Date of Birth/Sex: Treating RN: Aug 10, 1946 (76 y.o. [name] Primary Care Provider: Fulton Reek Other Clinician: Referring Provider: Treating Provider/Extender: Maurilio Lovely Weeks in Treatment: 6 Active Problems ICD-10 Encounter Code Description Active Date MDM Diagnosis E11.621 Type 2 diabetes mellitus with foot ulcer 06/19/2022 No Yes L97.522 Non-pressure chronic ulcer of other part of left foot with fat 06/19/2022 No Yes layer exposed E11.42 Type 2 diabetes mellitus with diabetic polyneuropathy 06/19/2022 No Yes [name], [name] ([id number]) [id number] Page 2 of 2 Inactive Problems Resolved Problems Electronic Signature(s) Signed: 08/04/2022 9:16:55 AM By: Worthy Keeler PA-C Entered By: Worthy Keeler on 08/04/2022 09:16:55

## 2022-08-10 ENCOUNTER — Encounter: Payer: Medicare HMO | Admitting: Physician Assistant

## 2022-08-10 DIAGNOSIS — E11621 Type 2 diabetes mellitus with foot ulcer: Secondary | ICD-10-CM | POA: Diagnosis not present

## 2022-08-10 NOTE — Progress Notes (Signed)
DAWN, KIPER ([id number]) [id number] Page 1 of 2 Visit Report for 08/10/2022 Chief Complaint Document Details Patient Name: Date of Service: [name], GEO [facility] J. 08/10/2022 10:00 A M Medical Record Number: [id number] Patient Account Number: [id number] Date of Birth/Sex: Treating RN: 03/15/1946 (76 y.o. [name] Primary Care Provider: Fulton Reek Other Clinician: Referring Provider: Treating Provider/Extender: Maurilio Lovely Weeks in Treatment: 7 Information Obtained from: Patient Chief Complaint Left heel ulcer Electronic Signature(s) Signed: 08/10/2022 10:22:23 AM By: Worthy Keeler PA-C Entered By: Worthy Keeler on 08/10/2022 10:22:23 -------------------------------------------------------------------------------- Problem List Details Patient Name: Date of Service: [name], [name] 08/10/2022 10:00 A M Medical Record Number: [id number] Patient Account Number: [id number] Date of Birth/Sex: Treating RN: Dec 18, 1945 (76 y.o. [name] Primary Care Provider: Fulton Reek Other Clinician: Referring Provider: Treating Provider/Extender: Maurilio Lovely Weeks in Treatment: 7 Active Problems ICD-10 Encounter Code Description Active Date MDM Diagnosis E11.621 Type 2 diabetes mellitus with foot ulcer 06/19/2022 No Yes L97.522 Non-pressure chronic ulcer of other part of left foot with fat 06/19/2022 No Yes layer exposed E11.42 Type 2 diabetes mellitus with diabetic polyneuropathy 06/19/2022 No Yes KELLYN, MANSFIELD ([id number]) [id number] Page 2 of 2 Inactive Problems Resolved Problems Electronic Signature(s) Signed: 08/10/2022 10:22:19 AM By: Worthy Keeler PA-C Entered By: Worthy Keeler on 08/10/2022 10:22:19

## 2022-08-10 NOTE — Progress Notes (Signed)
LUCIFER, SOJA ([id number]) [id number] Page 1 of 10 Visit Report for 08/10/2022 Arrival Information Details Patient Name: Date of Service: [name], [name] [facility] J. 08/10/2022 10:00 A M Medical Record Number: [id number] Patient Account Number: [id number] Date of Birth/Sex: Treating RN: [name] (76 y.o. [name] Primary Care [name]: [name] Other Clinician: Referring [name]: Treating Fabiha Rougeau/Extender: [name] in Treatment: 7 Visit Information History Since Last Visit Added or deleted any medications: No Patient Arrived: [name] Any new allergies or adverse reactions: No Arrival Time: 10:12 Hospitalized since last visit: No Accompanied By: self Pain Present Now: No Transfer Assistance: None Patient Requires Transmission-Based Precautions: No Patient Has Alerts: Yes Patient Alerts: Patient on Blood Thinner Warfarin Type II Diabetic Electronic Signature(s) Signed: 08/10/2022 4:52:26 PM By: [name] Loud MSN RN CNS WTA Entered By: [name] on 08/10/2022 10:29:43 -------------------------------------------------------------------------------- Clinic Level of Care Assessment Details Patient Name: Date of Service: [name], [name] [name] 08/10/2022 10:00 A M Medical Record Number: [id number] Patient Account Number: [id number] Date of Birth/Sex: Treating RN: [name] (76 y.o. [name] Primary Care [name]: [name] Other Clinician: Referring [name]: Treating [name]/Extender: [name] in Treatment: 7 Clinic Level of Care Assessment Items TOOL 1 Quantity Score _0  - 0 Use when EandM and Procedure is performed on INITIAL visit ASSESSMENTS - Nursing Assessment / Reassessment _1  - 0 General Physical Exam (combine w/ comprehensive assessment (listed just below) when performed on new pt. evals) _2  - 0 Comprehensive Assessment (HX, ROS, Risk Assessments, Wounds Hx,  etc.) ASSESSMENTS - Wound and Skin Assessment / Reassessment _3  - 0 Dermatologic / Skin Assessment (not related to wound area) ASSESSMENTS - Ostomy and/or Continence Assessment and Care OTIS, PORTAL ([id number]) [phone].pdf Page 2 of 10 _4  - 0 Incontinence Assessment and Management _5  - 0 Ostomy Care Assessment and Management (repouching, etc.) PROCESS - Coordination of Care _6  - 0 Simple Patient / Family Education for ongoing care _7  - 0 Complex (extensive) Patient / Family Education for ongoing care _8  - 0 Staff obtains Programmer, systems, Records, T Results / Process Orders est _9  - 0 Staff telephones HHA, Nursing Homes / Clarify orders / etc _10  - 0 Routine Transfer to another Facility (non-emergent condition) _11  - 0 Routine Hospital Admission (non-emergent condition) _12  - 0 New Admissions / Biomedical engineer / Ordering NPWT Apligraf, etc. , _13  - 0 Emergency Hospital Admission (emergent condition) PROCESS - Special Needs _14  - 0 Pediatric / Minor Patient Management _15  - 0 Isolation Patient Management _16  - 0 Hearing / Language / Visual special needs _17  - 0 Assessment of Community assistance (transportation, D/C planning, etc.) _18  - 0 Additional assistance / Altered mentation _19  - 0 Support Surface(s) Assessment (bed, cushion, seat, etc.) INTERVENTIONS - Miscellaneous _20  - 0 External ear exam _21  - 0 Patient Transfer (multiple staff / Civil Service fast streamer / Similar devices) _22  - 0 Simple Staple / Suture removal (25 or less) _23  - 0 Complex Staple / Suture removal (26 or more) _24  - 0 Hypo/Hyperglycemic Management (do not check if billed separately) _25  - 0 Ankle / Brachial Index (ABI) - do not check if billed separately Has the patient been seen at the hospital within the last three years: Yes Total Score: 0 Level Of Care: ____ Electronic Signature(s) Signed: 08/10/2022 4:52:26 PM By: [name] Loud MSN RN CNS WTA Entered By: [name] on  08/10/2022 10:58:20 -------------------------------------------------------------------------------- Encounter Discharge Information Details Patient Name: Date of Service: [name], [name] [name] 08/10/2022  10:00 A M Medical Record Number: [id number] Patient Account Number: [id number] Date of Birth/Sex: Treating RN: [name] (76 y.o. [name] Primary Care Adalea Handler: [name] Other Clinician: Referring [name]: Treating [name]/Extender: [name] in Treatment: 7 Encounter Discharge Information Items Post Procedure Vitals Discharge Condition: Stable Temperature (F): 98.5 [name], [name] ([id number]) [phone].pdf Page 3 of 10 Ambulatory Status: Cane Pulse (bpm): 97 Discharge Destination: Home Respiratory Rate (breaths/min): 16 Transportation: Private Auto Blood Pressure (mmHg): 114/57 Accompanied By: self Schedule Follow-up Appointment: Yes Clinical Summary of Care: Electronic Signature(s) Signed: 08/10/2022 4:52:26 PM By: [name] Loud MSN RN CNS WTA Entered By: [name] on 08/10/2022 11:01:05 -------------------------------------------------------------------------------- Lower Extremity Assessment Details Patient Name: Date of Service: [name], [name] [name] 08/10/2022 10:00 A M Medical Record Number: [id number] Patient Account Number: [id number] Date of Birth/Sex: Treating RN: October 19, 1946 (76 y.o. [name] Primary Care Yasmin Dibello: [name] Other Clinician: Referring Islah Eve: Treating Kimley Apsey/Extender: Maurilio Lovely Weeks in Treatment: 7 Edema Assessment Assessed: [Left: Yes] [Right: No] Edema: [Left: Ye] [Right: s] Calf Left: Right: Point of Measurement: 33 cm From Medial Instep 39.8 cm Ankle Left: Right: Point of Measurement: 11 cm From Medial Instep 24 cm Vascular Assessment Pulses: Dorsalis Pedis Palpable: [Left:Yes] Electronic Signature(s) Signed: 08/10/2022 4:52:26 PM  By: [name] Loud MSN RN CNS WTA Entered By: [name] on 08/10/2022 10:30:04 Multi Wound Chart Details -------------------------------------------------------------------------------- [name] ([id number]) [id number] Page 4 of 10 Patient Name: Date of Service: [name], [name] [name] 08/10/2022 10:00 A M Medical Record Number: [id number] Patient Account Number: [id number] Date of Birth/Sex: Treating RN: [name] (76 y.o. 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 N/A Primary Etiology: Extremity Pressure Ulcer Diabetic Wound/Ulcer of the Lower N/A Secondary Etiology: Extremity Cataracts, Arrhythmia, Coronary Cataracts, Arrhythmia, Coronary N/A Comorbid History: Artery Disease, Hypertension, Type II Artery Disease, Hypertension, Type II Diabetes, Osteoarthritis, Neuropathy Diabetes, Osteoarthritis, Neuropathy 06/05/2022 07/28/2022 N/A Date Acquired: 7 1 N/A Weeks of Treatment: Open Open N/A Wound Status: No No N/A Wound Recurrence: 1.5x2.1x0.1 1.5x1.8x0.1 N/A Measurements L x W x D (cm) 2.474 2.121 N/A A (cm) : rea 0.247 0.212 N/A Volume (cm) : -75.00% 50.00% N/A % Reduction in Area: 12.70% 50.00% N/A % Reduction in Volume: Grade 1 Full Thickness Without Exposed N/A Classification: Support Structures Medium Medium N/A Exudate Amount: Serous Serosanguineous N/A Exudate Type: amber red, brown N/A Exudate Color: Flat  and Intact Flat and Intact N/A Wound Margin: Small (1-33%) Large (67-100%) N/A Granulation Amount: Pink Red N/A Granulation Quality: Large (67-100%) None Present (0%) N/A Necrotic Amount: Fat Layer (Subcutaneous Tissue): Yes Fat Layer (Subcutaneous Tissue): Yes N/A Exposed Structures: Fascia: No Fascia: No Tendon: No Tendon: No Muscle: No Muscle: No Joint: No Joint: No Bone: No Bone: No Small (1-33%) Small (1-33%) N/A Epithelialization: Treatment Notes Electronic Signature(s) Signed: 08/10/2022 4:52:26 PM By: [name] Loud MSN RN CNS WTA Entered By: [name] on 08/10/2022 10:40:47 [name] ([id number]) [id number] Page 5 of 10 -------------------------------------------------------------------------------- Multi-Disciplinary Care Plan Details Patient Name: Date of Service: [name], [name] [name] 08/10/2022 10:00 A M Medical Record  Number: [id number] Patient Account Number: [id number] Date of Birth/Sex: Treating RN: [birth year] [name] (76 y.o. [name] Primary Care Slaton Reaser: [name] Other Clinician: Referring Burma Ketcher: Treating Camil Wilhelmsen/Extender: Maurilio Lovely Weeks in Treatment: 7 Active Inactive Pressure Nursing Diagnoses: Knowledge deficit related to causes and risk factors for pressure ulcer development Knowledge deficit related to management of pressures ulcers Potential for impaired tissue integrity related to pressure, friction, moisture, and shear Goals: Patient will remain free from development of additional pressure ulcers Date Initiated: 06/19/2022 Date Inactivated: 07/28/2022 Target Resolution Date: 06/19/2022 Goal Status: Met Patient/caregiver will verbalize risk factors for pressure ulcer development Date Initiated: 06/19/2022 Date Inactivated: 07/28/2022 Target Resolution Date: 06/19/2022 Goal Status: Met Patient/caregiver will verbalize understanding of pressure ulcer management Date Initiated:  06/19/2022 Target Resolution Date: 08/14/2022 Goal Status: Active Interventions: Assess: immobility, friction, shearing, incontinence upon admission and as needed Assess offloading mechanisms upon admission and as needed Assess potential for pressure ulcer upon admission and as needed Provide education on pressure ulcers Notes: Wound/Skin Impairment Nursing Diagnoses: Impaired tissue integrity Goals: Patient/caregiver will verbalize understanding of skin care regimen Date Initiated: 06/19/2022 Date Inactivated: 07/10/2022 Target Resolution Date: 06/19/2022 Goal Status: Met Ulcer/skin breakdown will have a volume reduction of 30% by week 4 Date Initiated: 06/19/2022 Date Inactivated: 07/28/2022 Target Resolution Date: 07/17/2022 Goal Status: Unmet Unmet Reason: infection Ulcer/skin breakdown will have a volume reduction of 50% by week 8 Date Initiated: 07/28/2022 Target Resolution Date: 08/14/2022 Goal Status: Active Interventions: Assess patient/caregiver ability to obtain necessary supplies Assess patient/caregiver ability to perform ulcer/skin care regimen upon admission and as needed Assess ulceration(s) every visit Provide education on ulcer and skin care Treatment Activities: Skin care regimen initiated : 06/19/2022 Topical wound management initiated : 06/19/2022 [name], [name] ([id number]) [phone].pdf Page 6 of 10 Notes: Electronic Signature(s) Signed: 08/10/2022 4:52:26 PM By: [name] Loud MSN RN CNS WTA Entered By: [name] on 08/10/2022 10:31:05 -------------------------------------------------------------------------------- Pain Assessment Details Patient Name: Date of Service: [name], [name] [name] 08/10/2022 10:00 A M Medical Record Number: [id number] Patient Account Number: [id number] Date of Birth/Sex: Treating RN: [birth year] (76 y.o. [name] Primary Care Trice Aspinall: [name] Other Clinician: Referring Laray Corbit: Treating  Unita Detamore/Extender: Maurilio Lovely Weeks in Treatment: 7 Active Problems Location of Pain Severity and Description of Pain Patient Has Paino No Site Locations Rate the pain. Current Pain Level: 0 Pain Management and Medication Current Pain Management: Notes Finished antibiotic Electronic Signature(s) Signed: 08/10/2022 4:52:26 PM By: [name] Loud MSN RN CNS WTA Entered By: [name] on 08/10/2022 10:29:52 [name] ([id number]) [id number] Page 7 of 10 -------------------------------------------------------------------------------- Patient/Caregiver Education Details Patient Name: Date of Service: [name], [name] [name] 10/23/2023andnbsp10:00 A M Medical Record Number: [id number] Patient Account Number: [id number] Date of Birth/Gender: Treating RN: [name] (76 y.o. [name] Primary Care Physician: [name] Other Clinician: Referring Physician: Treating Physician/Extender: [name] in Treatment: 7 Education Assessment Education Provided To: Patient Education Topics Provided Offloading: Methods: Demonstration Responses: Return demonstration correctly Wound Debridement: Handouts: Wound Debridement Methods: Demonstration Responses: State content correctly Electronic Signature(s) Signed: 08/10/2022 4:52:26 PM By: [name] Loud MSN RN CNS WTA Entered By: [name] on 08/10/2022 10:59:27 -------------------------------------------------------------------------------- Wound Assessment Details Patient Name: Date of Service: [name], [name] [name] 08/10/2022 10:00 A M Medical Record Number: [id number] Patient Account Number: [id number] Date of Birth/Sex: Treating RN: [name] (76 y.o. [name] Primary Care Aspin Palomarez: [name] Other Clinician: Referring Bonifacio Pruden: Treating Ashden Sonnenberg/Extender: Cristine Polio,  Hughes Better in Treatment: 7 Wound Status Wound Number: 10  Primary Diabetic Wound/Ulcer of the Lower Extremity Etiology: Wound Location: Left Calcaneus Secondary Pressure Ulcer Wounding Event: Gradually Appeared Etiology: Date Acquired: 06/05/2022 Wound Open Weeks Of Treatment: 7 Status: Clustered Wound: No Comorbid Cataracts, Arrhythmia, Coronary Artery Disease, Hypertension, History: Type II Diabetes, Osteoarthritis, Neuropathy [name], [name] ([id number]) [id number] Page 8 of 10 Photos Wound Measurements Length: (cm) 1.5 Width: (cm) 2.1 Depth: (cm) 0.1 Area: (cm) 2.474 Volume: (cm) 0.247 % Reduction in Area: -75% % Reduction in Volume: 12.7% Epithelialization: Small (1-33%) Wound Description Classification: Grade 1 Wound Margin: Flat and Intact Exudate Amount: Medium Exudate Type: Serous Exudate Color: amber Foul Odor After Cleansing: No Slough/Fibrino Yes Wound Bed Granulation Amount: Small (1-33%) Exposed Structure Granulation Quality: Pink Fascia Exposed: No Necrotic Amount: Large (67-100%) Fat Layer (Subcutaneous Tissue) Exposed: Yes Necrotic Quality: Adherent Slough Tendon Exposed: No Muscle Exposed: No Joint Exposed: No Bone Exposed: No Treatment Notes Wound #10 (Calcaneus) Wound Laterality: Left Cleanser Peri-Wound Care Topical Primary Dressing Prisma 4.34 (in) Discharge Instruction: Moisten w/normal saline or sterile water; Cover wound as directed. Do not remove from wound bed. Secondary Dressing ABD Pad 5x9 (in/in) Discharge Instruction: Cover with ABD pad Secured With ACE WRAP - 16M ACE Elastic Bandage With VELCRO Brand Closure, 4 (in) Conform 4'' - Conforming Stretch [name] Bandage 4x75 (in/in) Discharge Instruction: Apply as directed Compression Wrap Compression Stockings Add-Ons Electronic Signature(s) Signed: 08/10/2022 4:52:26 PM By: [name] Loud MSN RN CNS WTA Entered By: [name] on 08/10/2022 10:28:44 [name] ([id number])  [id number] Page 9 of 10 -------------------------------------------------------------------------------- Wound Assessment Details Patient Name: Date of Service: [name], [name] [name] 08/10/2022 10:00 A M Medical Record Number: [id number] Patient Account Number: [id number] Date of Birth/Sex: Treating RN: [name] (76 y.o. 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 Wounding Event: Blister Etiology: Date Acquired: 07/28/2022 Wound Open Weeks Of Treatment: 1 Status: Clustered Wound: No Comorbid Cataracts, Arrhythmia, Coronary Artery Disease, Hypertension, History: Type II Diabetes, Osteoarthritis, Neuropathy Photos Photo Uploaded By: [name] on 08/10/2022 10:31:50 Wound Measurements Length: (cm) 1.5 Width: (cm) 1.8 Depth: (cm) 0.1 Area: (cm) 2.121 Volume: (cm) 0.212 % Reduction in Area: 50% % Reduction in Volume: 50% Epithelialization: Small (1-33%) Wound Description Classification: Full Thickness Without Exposed Support Wound Margin: Flat and Intact Exudate Amount: Medium Exudate Type: Serosanguineous Exudate Color: red, brown Structures Foul Odor After Cleansing: No Slough/Fibrino No Wound Bed Granulation Amount: Large (67-100%) Exposed Structure Granulation Quality: Red Fascia Exposed: No Necrotic Amount: None Present (0%) Fat Layer (Subcutaneous Tissue) Exposed: Yes Tendon Exposed: No Muscle Exposed: No Joint Exposed: No Bone Exposed: No Treatment Notes Wound #12 (Lower Leg) Wound Laterality: Right, Anterior Cleanser [name], [name] ([id number]) [id number] Page 10 of 10 Peri-Wound Care Topical Triple Antibiotic  Ointment Primary Dressing Secondary Dressing Bordered [name] Sterile-HBD 4x4 (in/in) Discharge Instruction: Cover wound with Bordered Guaze Sterile as directed Secured With Compression Wrap Compression Stockings Add-Ons Electronic Signature(s) Signed: 08/10/2022 4:52:26 PM By: [name] Loud MSN RN CNS WTA Entered By: [name] on 08/10/2022 10:28:57 -------------------------------------------------------------------------------- Vitals Details Patient Name: Date of Service: [name], [name] [name] 08/10/2022 10:00 A M Medical Record Number: [id number] Patient Account Number: [id number] Date of Birth/Sex: Treating RN: [birth year][name] (76 y.o. [name] Primary  Care Napolean Sia: [name] Other Clinician: Referring Oluwaferanmi Wain: Treating Abid Bolla/Extender: [name] in Treatment: 7 Vital Signs Time Taken: 10:15 Temperature (F): 98.5 Height (in): 70 Pulse (bpm): 97 Weight (lbs): 265 Respiratory Rate (breaths/min): 16 Body Mass Index (BMI): 38 Blood Pressure (mmHg): 114/57 Reference Range: 80 - 120 mg / dl Electronic Signature(s) Signed: 08/10/2022 4:52:26 PM By: [name] Loud MSN RN CNS WTA Entered By: [name] on 08/10/2022 10:29:46

## 2022-08-11 ENCOUNTER — Ambulatory Visit: Payer: Medicare HMO | Admitting: Physician Assistant

## 2022-08-17 ENCOUNTER — Encounter: Payer: Self-pay | Admitting: Neurology

## 2022-08-17 ENCOUNTER — Encounter: Payer: Medicare HMO | Admitting: Physician Assistant

## 2022-08-17 ENCOUNTER — Ambulatory Visit (INDEPENDENT_AMBULATORY_CARE_PROVIDER_SITE_OTHER): Payer: Medicare HMO | Admitting: Neurology

## 2022-08-17 ENCOUNTER — Encounter (INDEPENDENT_AMBULATORY_CARE_PROVIDER_SITE_OTHER): Payer: Self-pay

## 2022-08-17 VITALS — BP 168/78 | HR 74 | Ht 70.0 in | Wt 277.0 lb

## 2022-08-17 DIAGNOSIS — E1149 Type 2 diabetes mellitus with other diabetic neurological complication: Secondary | ICD-10-CM

## 2022-08-17 DIAGNOSIS — R269 Unspecified abnormalities of gait and mobility: Secondary | ICD-10-CM | POA: Diagnosis not present

## 2022-08-17 DIAGNOSIS — E11621 Type 2 diabetes mellitus with foot ulcer: Secondary | ICD-10-CM | POA: Diagnosis not present

## 2022-08-17 DIAGNOSIS — G629 Polyneuropathy, unspecified: Secondary | ICD-10-CM | POA: Diagnosis not present

## 2022-08-17 MED ORDER — PREGABALIN 150 MG PO CAPS: ORAL_CAPSULE | ORAL | 3 refills | Status: DC

## 2022-08-17 NOTE — Addendum Note (Signed)
Addended byAlric Ran on: 08/17/2022 10:16 AM   Modules accepted: Level of Service

## 2022-08-17 NOTE — Progress Notes (Signed)
[name], [name] ([id number]) [id number] Page 1 of 7 Visit Report for 08/17/2022 Arrival Information Details Patient Name: Date of Service: [name] [facility] 08/17/2022 12:45 PM Medical Record Number: [id number] Patient Account Number: [id number] Date of Birth/Sex: Treating RN: January 09, 1946 (76 y.o. [name], [name] Primary Care Jan Olano: Fulton Reek Other Clinician: Massie Kluver Referring [name]: Treating [name]/Extender: [name] in Treatment: 8 Visit Information History Since Last Visit All ordered tests and consults were completed: No Patient Arrived: [name] Added or deleted any medications: No Arrival Time: 12:57 Any new allergies or adverse reactions: No Transfer Assistance: None Had a fall or experienced change in No Patient Requires Transmission-Based Precautions: No activities of daily living that may affect Patient Has Alerts: Yes risk of falls: Patient Alerts: Patient on Blood Thinner Hospitalized since last visit: No Warfarin Pain Present Now: No Type II Diabetic Electronic Signature(s) Unsigned Entered ByMassie Kluver on 08/17/2022 12:57:52 -------------------------------------------------------------------------------- Lower Extremity Assessment Details Patient Name: Date of ServiceSuann [name] 08/17/2022 12:45 PM Medical Record Number: [id number] Patient Account Number: [id number] Date of Birth/Sex: Treating RN: [birth year] (76 y.o. [name] Primary Care [name]: Fulton Reek Other Clinician: Massie Kluver Referring [name]: Treating [name]/Extender: [name] Weeks in Treatment: 8 Edema Assessment Assessed: [Left: No] [Right: No] [Left: Edema] [Right: :] Calf Left: Right: Point of Measurement: 33 cm From Medial Instep Ankle IDEN, [name] ([id number]) [id number] Page 2 of 7 Left: Right: Point of Measurement: 11 cm From  Medial Instep Electronic Signature(s) Unsigned Entered By: Massie Kluver on 08/17/2022 13:12:34 -------------------------------------------------------------------------------- Multi Wound Chart Details Patient Name: Date of Service: [name] [facility] 08/17/2022 12:45 PM Medical Record Number: [id number] Patient Account Number: [id number] Date of Birth/Sex: Treating RN: [birth year] (76 y.o. 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 N/A Primary Etiology: Extremity Pressure Ulcer Diabetic Wound/Ulcer of the Lower N/A Secondary Etiology: Extremity Cataracts, Arrhythmia, Coronary Cataracts, Arrhythmia, Coronary N/A Comorbid History: Artery Disease, Hypertension, Type II Artery Disease, Hypertension, Type II Diabetes, Osteoarthritis, Neuropathy Diabetes, Osteoarthritis, Neuropathy 06/05/2022 07/28/2022 N/A Date Acquired: 8 2 N/A Weeks of Treatment: Open Open N/A Wound Status: No No N/A Wound Recurrence: 0.8x1.3x0.1 0.1x0.1x0.1 N/A Measurements L x W x D (cm) 0.817 0.008 N/A A (cm) : rea 0.082 0.001 N/A Volume (cm) : 42.20% 99.80% N/A % Reduction in Area: 71.00% 99.80% N/A % Reduction in Volume: Grade 1 Full Thickness Without Exposed N/A Classification: Support Structures Medium Medium N/A Exudate Amount: Serous Serosanguineous N/A Exudate Type: amber red, brown N/A Exudate Color: Flat and Intact Flat and Intact N/A Wound Margin: Small (1-33%) Large (67-100%)  N/A Granulation Amount: Pink Red N/A Granulation Quality: Large (67-100%) None Present (0%) N/A Necrotic Amount: [name], [name] ([id number]) [id number] Page 3 of 7 Fat Layer (Subcutaneous Tissue): Yes Fat Layer (Subcutaneous Tissue): Yes N/A Exposed Structures: Fascia: No Fascia: No Tendon: No Tendon: No Muscle: No Muscle: No Joint: No Joint: No Bone: No Bone: No Small (1-33%) Small (1-33%) N/A Epithelialization: Treatment Notes Electronic Signature(s) Unsigned Entered ByMassie Kluver on 08/17/2022 13:12:46 -------------------------------------------------------------------------------- Multi-Disciplinary Care Plan Details Patient Name: Date of Service: [name], [name] [name] J. 08/17/2022 12:45 PM Medical Record Number: [id number] Patient Account Number: [id number] Date of Birth/Sex: Treating RN: [birth year] (76 y.o. [name], [name] Primary Care [name]: Fulton Reek Other Clinician: Massie Kluver Referring [name]: Treating [name]/Extender: [name] Weeks in Treatment: 8 Active Inactive Pressure Nursing Diagnoses: Knowledge deficit related to causes and risk factors for pressure ulcer development Knowledge deficit related to management of pressures ulcers Potential for impaired tissue integrity related to pressure, friction, moisture, and shear Goals: Patient will remain free from development of additional pressure ulcers Date Initiated: 06/19/2022 Date Inactivated: 07/28/2022 Target Resolution Date: 06/19/2022 Goal Status: Met Patient/caregiver will verbalize risk factors for pressure ulcer development Date Initiated: 06/19/2022 Date Inactivated: 07/28/2022 Target Resolution Date: 06/19/2022 Goal Status: Met Patient/caregiver will verbalize understanding of pressure ulcer management Date Initiated: 06/19/2022 Target Resolution Date: 08/14/2022 Goal Status: Active Interventions: Assess: immobility, friction,  shearing, incontinence upon admission and as needed Assess offloading mechanisms upon admission and as needed Assess potential for pressure ulcer upon admission and as needed Provide education on pressure ulcers Notes: Wound/Skin Impairment Nursing Diagnoses: Impaired tissue integrity Goals: Patient/caregiver will verbalize understanding of skin care regimen [name], [name] ([id number]) [id number] Page 4 of 7 Date Initiated: 06/19/2022 Date Inactivated: 07/10/2022 Target Resolution Date: 06/19/2022 Goal Status: Met Ulcer/skin breakdown will have a volume reduction of 30% by week 4 Date Initiated: 06/19/2022 Date Inactivated: 07/28/2022 Target Resolution Date: 07/17/2022 Goal Status: Unmet Unmet Reason: infection Ulcer/skin breakdown will have a volume reduction of 50% by week 8 Date Initiated: 07/28/2022 Target Resolution Date: 08/14/2022 Goal Status: Active Interventions: Assess patient/caregiver ability to obtain necessary supplies Assess patient/caregiver ability to perform ulcer/skin care regimen upon admission and as needed Assess ulceration(s) every visit Provide education on ulcer and skin care Treatment Activities: Skin care regimen initiated : 06/19/2022 Topical wound management initiated : 06/19/2022 Notes: Electronic Signature(s) Unsigned Entered By: Massie Kluver on 08/17/2022 13:12:38 -------------------------------------------------------------------------------- Pain Assessment Details Patient Name: Date of ServiceSuann [name] 08/17/2022 12:45 PM Medical Record Number: [id number] Patient Account Number: [id number] Date of Birth/Sex: Treating RN: 1946-05-29 (76 y.o. [name] Primary Care Zonnie Landen: Fulton Reek Other Clinician: Massie Kluver Referring Olyn Landstrom: Treating Lucina Betty/Extender: [name] Weeks in Treatment: 8 Active Problems Location of Pain Severity and Description of Pain Patient Has  Paino No Site Locations Pain Management and Medication Current Pain Management: [name], [name] ([id number]) [id number] Page 5 of 7 Electronic Signature(s) Unsigned Entered By: Massie Kluver on 08/17/2022 13:02:14 -------------------------------------------------------------------------------- Wound Assessment Details Patient Name: Date of Service: [name] [facility] 08/17/2022 12:45 PM Medical Record Number: [id number] Patient Account Number: [id number] Date of Birth/Sex: Treating RN: Jul 27, 1946 (76 y.o. [name], [name] Primary Care Alexsia Klindt: Fulton Reek Other Clinician: Massie Kluver Referring [name] [name]: Treating Oran Dillenburg/Extender: [name] Weeks in Treatment: 8 Wound Status Wound Number: 10 Primary Diabetic Wound/Ulcer of the Lower Extremity Etiology: Wound Location: Left Calcaneus Secondary Pressure Ulcer Wounding Event: Gradually Appeared Etiology: Date Acquired: 06/05/2022 Wound Open Weeks Of Treatment: 8 Status: Clustered Wound: No Comorbid Cataracts, Arrhythmia, Coronary Artery Disease, Hypertension, History: Type II Diabetes, Osteoarthritis, Neuropathy Photos Wound Measurements Length: (cm) 0.8 Width: (cm) 1.3 Depth: (cm) 0.1 Area: (cm) 0.817 Volume: (cm) 0.082 % Reduction in Area: 42.2% % Reduction in Volume: 71% Epithelialization: Small (1-33%) Wound Description Classification: Grade 1 Wound Margin: Flat and Intact Exudate Amount: Medium Exudate Type: Serous Exudate Color: amber Foul Odor After Cleansing: No Slough/Fibrino Yes Wound Bed Granulation Amount: Small (1-33%)  Exposed Structure Granulation Quality: Pink Fascia Exposed: No Necrotic Amount: Large (67-100%) Fat Layer (Subcutaneous Tissue) Exposed: Yes Necrotic Quality: Adherent Slough Tendon Exposed: No Muscle Exposed: No [name], [name] ([id number]) [id number] Page 6 of 7 Joint Exposed: No Bone Exposed:  No Electronic Signature(s) Unsigned Entered By: Massie Kluver on 08/17/2022 13:11:23 -------------------------------------------------------------------------------- Wound Assessment Details Patient Name: Date of Service: [name] 08/17/2022 12:45 PM Medical Record Number: [id number] Patient Account Number: [id number] Date of Birth/Sex: Treating RN: [birth year] (76 y.o. 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 Wounding Event: Blister Etiology: Date Acquired: 07/28/2022 Wound Open Weeks Of Treatment: 2 Status: Clustered Wound: No Comorbid Cataracts, Arrhythmia, Coronary Artery Disease, Hypertension, History: Type II Diabetes, Osteoarthritis, Neuropathy Photos Wound Measurements Length: (cm) 0.1 Width: (cm) 0.1 Depth: (cm) 0.1 Area: (cm) 0.008 Volume: (cm) 0.001 % Reduction in Area: 99.8% % Reduction in Volume: 99.8% Epithelialization: Small (1-33%) Wound Description Classification: Full Thickness Without Exposed Support Structures Wound Margin: Flat and Intact Exudate Amount: Medium Exudate Type: Serosanguineous Exudate Color: red, brown Foul Odor After Cleansing: No Slough/Fibrino No Wound Bed Granulation Amount: Large (67-100%) Exposed Structure Granulation Quality: Red Fascia Exposed: No Necrotic Amount: None Present (0%) Fat Layer (Subcutaneous Tissue) Exposed: Yes [name], [name] ([id number]) [id number] Page 7 of 7 Tendon Exposed: No Muscle Exposed: No Joint Exposed: No Bone Exposed: No Electronic Signature(s) Unsigned Entered By: Massie Kluver on 08/17/2022  13:11:56 -------------------------------------------------------------------------------- Vitals Details Patient Name: Date of Service: [name] [facility] J. 08/17/2022 12:45 PM Medical Record Number: [id number] Patient Account Number: [id number] Date of Birth/Sex: Treating RN: Mar 28, 1946 (76 y.o. [name], [name] Primary Care Nariah Morgano: Fulton Reek Other Clinician: Massie Kluver Referring Juanantonio Stolar: Treating Delvis Kau/Extender: [name] Weeks in Treatment: 8 Vital Signs Time Taken: 13:01 Temperature (F): 98.3 Height (in): 70 Pulse (bpm): 66 Weight (lbs): 265 Respiratory Rate (breaths/min): 18 Body Mass Index (BMI): 38 Blood Pressure (mmHg): 168/70 Reference Range: 80 - 120 mg / dl Electronic Signature(s) Unsigned Entered ByMassie Kluver on 08/17/2022 13:01:59 Signature(s): Date(s):

## 2022-08-17 NOTE — Progress Notes (Signed)
[facility]  PATIENT: [name] DOB: [birth year]  REQUESTING CLINICIAN: Idelle Crouch, MD HISTORY FROM: Patient and spouse  REASON FOR VISIT: Dizziness, neuropathy    HISTORICAL  CHIEF COMPLAINT:  Chief Complaint  Patient presents with   Follow-up    Rm 13, alone  Doing well on pregabalin 150 mg twice daily, no new concerns     INTERVAL HISTORY 08/17/22:  Patient presents today for follow-up, at last visit in June we had discontinued his Trileptal and gabapentin and start him on pregabalin.  Since then he has been doing well.  Reports that 150 twice a day of pregabalin has been helpful but at night he still experience neuropathic pain. He is still on duloxetine.  He also reports in the past, he was trialed for a spinal cord stimulator for neuropathy but did not tolerated therefore did not move forward with the procedure.  Still uses a cane with ambulation, denies any falls.   INTERVAL HISTORY 04/13/22 [name] presented today for follow-up, at last visit I started her on Trileptal for the peripheral neuropathy since he was already on gabapentin and duloxetine.  He states that his pain is not improving and actually worse in the evening when sitting down and relaxing.  He would like to try something else for the pain because this will bothering him, again described as burning, stabbing, numbing pain.  Currently he is using a walker, denies any recent falls.     HISTORY OF PRESENT ILLNESS:  This is a 76 year old gentleman with past medical history including atrial fibrillation, peripheral neuropathy, history of stroke, hypertension, hyperlipidemia, and diabetes mellitus type 2 who is presenting with complaint of dizziness.  Patient reports 4 years ago he was admitted, diagnosed with a stroke and since then has been having dizziness.  He described the dizziness as feeling lightheaded upon standing and starting to walk.  He denies any room spinning sensation and  denies having the dizziness while sitting down or laying down.  It only occurs upon standing and walking.  He feels like he is going to fall.  Currently he uses a cane.  He has not completed physical therapy lately.   On top of that, he is complaining of peripheral neuropathy, diagnosed for more than 15 years.  He describes the pain as painful burning sensation, difficulty walking, cannot feel the ground.  He did have work-up in the past including EMG, nerve conduction conduction study and ultrasound of the leg  Diagnosis was neuropathy and he had tried multiple medication including TCAs, antidepressant, antiseizure medication including Neurontin and pregabalin.  Currently he is on Neurontin 600 mg twice daily and duloxetine 60 mg in the evening.    OTHER MEDICAL CONDITIONS: Atrial fibrillation, peripheral neuropathy, diabetes mellitus type 2, hypertension, hyperlipidemia, CAD, history of CVA   REVIEW OF SYSTEMS: Full 14 system review of systems performed and negative with exception of: as noted in the HPI  ALLERGIES: Allergies  Allergen Reactions   Tetracycline Hives   Tetracyclines & Related Hives    HOME MEDICATIONS: Outpatient Medications Prior to Visit  Medication Sig Dispense Refill   amiodarone (PACERONE) 200 MG tablet Take 1 tablet (200 mg total) by mouth 2 (two) times daily. 60 tablet 0   aspirin EC 81 MG tablet Take 81 mg by mouth daily.     cyanocobalamin (,VITAMIN B-12,) 1000 MCG/ML injection Inject 1,000 mcg into the muscle every 30 (thirty) days.     cyclobenzaprine (FLEXERIL) 10 MG tablet Take  10 mg by mouth daily as needed for muscle spasms.     DULoxetine (CYMBALTA) 60 MG capsule Take 30 mg by mouth every evening.     ferrous sulfate 325 (65 FE) MG tablet Take 1 tablet (325 mg total) by mouth 2 (two) times daily with a meal. 60 tablet 3   loratadine (CLARITIN) 10 MG tablet Take 10 mg by mouth daily.     metFORMIN (GLUCOPHAGE) 1000 MG tablet Take 1,000 mg by mouth 2 (two)  times daily. With meals.     methadone (DOLOPHINE) 5 MG tablet Take 5 mg by mouth 2 (two) times daily.      methimazole (TAPAZOLE) 10 MG tablet Take 20 mg by mouth daily.     Multiple Vitamin (MULTI-VITAMINS) TABS Take 1 tablet by mouth daily.     pantoprazole (PROTONIX) 40 MG tablet Take 40 mg by mouth daily.     pravastatin (PRAVACHOL) 40 MG tablet Take 40 mg by mouth every evening.      torsemide (DEMADEX) 20 MG tablet Take by mouth daily.     warfarin (COUMADIN) 2.5 MG tablet Take 2.5 mg by mouth. Take on Monday and Friday only     warfarin (COUMADIN) 5 MG tablet Take 5 mg by mouth daily. Take on Tuesday, Wednesday, Thursday, Saturday, and Sunday     pregabalin (LYRICA) 150 MG capsule Take 1 capsule (150 mg total) by mouth 2 (two) times daily. 60 capsule 5   glipiZIDE (GLUCOTROL XL) 5 MG 24 hr tablet Take 5 mg by mouth daily.     metoprolol tartrate (LOPRESSOR) 25 MG tablet Take 25 mg by mouth 2 (two) times daily.      No facility-administered medications prior to visit.    PAST MEDICAL HISTORY: Past Medical History:  Diagnosis Date   Anemia    CHF (congestive heart failure) (HCC)    Chronic pain    Coronary artery disease    DDD (degenerative disc disease), hip    Diabetes mellitus without complication (HCC)    DJD (degenerative joint disease)    Dysrhythmia    GERD (gastroesophageal reflux disease)    History of hip replacement    Hyperlipemia    Hypertension    Insomnia    Neuropathy    Paroxysmal A-fib ([place])    Peripheral neuropathy    Stroke [facility])    Thoracic aortic atherosclerosis ([place])     PAST SURGICAL HISTORY: Past Surgical History:  Procedure Laterality Date   BACK SURGERY     CARDIAC SURGERY     CARDIAC VALVE REPLACEMENT     CARDIOVERSION N/A 05/04/2017   Procedure: CARDIOVERSION;  Surgeon: Corey Skains, MD;  Location: [facility] ORS;  Service: Cardiovascular;  Laterality: N/A;   CARDIOVERSION N/A 09/04/2021   Procedure: CARDIOVERSION;  Surgeon: Corey Skains, MD;  Location: [facility] ORS;  Service: Cardiovascular;  Laterality: N/A;   carpel tunnel release     CATARACT EXTRACTION W/ INTRAOCULAR LENS IMPLANT Bilateral    COLONOSCOPY     COLONOSCOPY WITH ESOPHAGOGASTRODUODENOSCOPY (EGD)     COLONOSCOPY WITH PROPOFOL N/A 05/16/2018   Procedure: COLONOSCOPY WITH PROPOFOL;  Surgeon: Manya Silvas, MD;  Location: [facility] ENDOSCOPY;  Service: Endoscopy;  Laterality: N/A;   COLONOSCOPY WITH PROPOFOL N/A 08/06/2021   Procedure: COLONOSCOPY WITH PROPOFOL;  Surgeon: Toledo, Benay Pike, MD;  Location: [facility] ENDOSCOPY;  Service: Gastroenterology;  Laterality: N/A;   ESOPHAGOGASTRODUODENOSCOPY (EGD) WITH PROPOFOL N/A 05/16/2018   Procedure: ESOPHAGOGASTRODUODENOSCOPY (EGD) WITH PROPOFOL;  Surgeon: Gaylyn Cheers  T, MD;  Location: [facility] ENDOSCOPY;  Service: Endoscopy;  Laterality: N/A;   JOINT REPLACEMENT     TEE WITHOUT CARDIOVERSION N/A 10/08/2016   Procedure: TRANSESOPHAGEAL ECHOCARDIOGRAM (TEE);  Surgeon: Teodoro Spray, MD;  Location: [facility] ORS;  Service: Cardiovascular;  Laterality: N/A;   TEE WITHOUT CARDIOVERSION N/A 11/23/2016   Procedure: TRANSESOPHAGEAL ECHOCARDIOGRAM (TEE);  Surgeon: Isaias Cowman, MD;  Location: [facility] ORS;  Service: Cardiovascular;  Laterality: N/A;   TONSILLECTOMY     VSD REPAIR      FAMILY HISTORY: Family History  Problem Relation Age of Onset   Stroke Mother    Heart attack Mother    Heart attack Father     SOCIAL HISTORY: Social History   Socioeconomic History   Marital status: Married    Spouse name: Not on file   Number of children: Not on file   Years of education: Not on file   Highest education level: Not on file  Occupational History   Not on file  Tobacco Use   Smoking status: Former    Types: Cigarettes    Quit date: 07/09/1996    Years since quitting: 26.1   Smokeless tobacco: Never  Vaping Use   Vaping Use: Never used  Substance and Sexual Activity   Alcohol use: Not Currently    Comment:  occasional   Drug use: No   Sexual activity: Yes  Other Topics Concern   Not on file  Social History Narrative   Not on file   Social Determinants of Health   Financial Resource Strain: Not on file  Food Insecurity: Not on file  Transportation Needs: Not on file  Physical Activity: Not on file  Stress: Not on file  Social Connections: Not on file  Intimate Partner Violence: Not on file    PHYSICAL EXAM  GENERAL EXAM/CONSTITUTIONAL: Vitals:  Vitals:   08/17/22 0935  BP: (!) 168/78  Pulse: 74  Weight: 277 lb (125.6 kg)  Height: 5\' 10"  (1.778 m)   Body mass index is 39.75 kg/m. Wt Readings from Last 3 Encounters:  08/17/22 277 lb (125.6 kg)  04/13/22 266 lb (120.7 kg)  03/05/22 268 lb (121.6 kg)   Patient is in no distress; well developed, nourished and groomed; neck is supple  EYES: Pupils round and reactive to light, Visual fields full to confrontation, Extraocular movements intacts,   MUSCULOSKELETAL: Gait, strength, tone, movements noted in Neurologic exam below  NEUROLOGIC: MENTAL STATUS:      No data to display         awake, alert, oriented to person, place and time recent and remote memory intact normal attention and concentration language fluent, comprehension intact, naming intact fund of knowledge appropriate  CRANIAL NERVE:  2nd, 3rd, 4th, 6th - pupils equal and reactive to light, visual fields full to confrontation, extraocular muscles intact, no nystagmus 5th - facial sensation symmetric 7th - facial strength symmetric 8th - hearing intact 9th - palate elevates symmetrically, uvula midline 11th - shoulder shrug symmetric 12th - tongue protrusion midline  MOTOR:  normal bulk and tone, full strength in the BUE, BLE  SENSORY:  Decrease sensation to light touch, pinprick and vibration in the bilateral lower extremities up to knees and affection tips of fingers. There is absent proprioception. Absent hair in the BLEs.   COORDINATION:   finger-nose-finger, fine finger movements normal   GAIT/STATION:  Uses a cane     DIAGNOSTIC DATA (LABS, IMAGING, TESTING) - I reviewed patient records, labs, notes, testing  and imaging myself where available.  Lab Results  Component Value Date   WBC 8.3 09/08/2021   HGB 10.3 (L) 09/08/2021   HCT 33.9 (L) 09/08/2021   MCV 94.4 09/08/2021   PLT 297 09/08/2021      Component Value Date/Time   NA 137 08/21/2021 0505   NA 135 (L) 08/10/2013 0554   K 3.5 08/21/2021 0505   K 3.6 08/10/2013 0554   CL 104 08/21/2021 0505   CL 104 08/10/2013 0554   CO2 27 08/21/2021 0505   CO2 24 08/10/2013 0554   GLUCOSE 153 (H) 08/21/2021 0505   GLUCOSE 143 (H) 08/10/2013 0554   BUN 19 08/21/2021 0505   BUN 12 08/10/2013 0554   CREATININE 0.85 08/21/2021 0505   CREATININE 0.98 08/10/2013 0554   CALCIUM 7.8 (L) 08/21/2021 0505   CALCIUM 8.4 (L) 08/10/2013 0554   PROT 6.5 08/18/2021 0649   ALBUMIN 2.9 (L) 08/18/2021 0649   AST 18 08/18/2021 0649   ALT 15 08/18/2021 0649   ALKPHOS 71 08/18/2021 0649   BILITOT 0.3 08/18/2021 0649   GFRNONAA >60 08/21/2021 0505   GFRNONAA >60 08/10/2013 0554   GFRAA >60 12/28/2018 1229   GFRAA >60 08/10/2013 0554   Lab Results  Component Value Date   CHOL 163 12/17/2016   HDL 48 12/17/2016   LDLCALC 85 12/17/2016   TRIG 152 (H) 12/17/2016   CHOLHDL 3.4 12/17/2016   Lab Results  Component Value Date   HGBA1C 7.3 (H) 08/17/2021   Lab Results  Component Value Date   VITAMINB12 205 08/18/2021   Lab Results  Component Value Date   TSH 3.560 08/18/2021    MRI Brain 07/07/20 Normal aging brain.     ASSESSMENT AND PLAN  76 y.o. year old male with atrial fibrillation, peripheral neuropathy, history of stroke, hypertension, hyperlipidemia, and diabetes mellitus type 2 who is presenting for follow up for his neuropathy.  He is doing well on Duloxetine and Pregabalin but still experiences pain in the evening. We will continue his Pregabalin 150  mg in the morning and increase his evening dose to 300 mg.  Continue other medication, continue with exercise, continue to follow with PCP and return in 6 months or sooner if worse    1. Peripheral polyneuropathy   2. Gait abnormality   3. Other diabetic neurological complication associated with type 2 diabetes mellitus ([place])       Patient Instructions  Increase pregabalin to 150 mg in the morning and 300 at night Continue medical other medication Continue exercise Follow with your PCP and return in 6 months or sooner if worse   No orders of the defined types were placed in this encounter.   Meds ordered this encounter  Medications   pregabalin (LYRICA) 150 MG capsule    Sig: Take 1 capsule (150 mg total) by mouth daily AND 2 capsules (300 mg total) every evening.    Dispense:  270 capsule    Refill:  3    Return in about 6 months (around 02/16/2023).    Alric Ran, MD 08/17/2022, 10:05 AM  [facility] [address], [place], [place] [address] [phone]

## 2022-08-17 NOTE — Patient Instructions (Signed)
Increase pregabalin to 150 mg in the morning and 300 at night Continue medical other medication Continue exercise Follow with your PCP and return in 6 months or sooner if worse

## 2022-08-18 NOTE — Progress Notes (Signed)
NORWOOD, QUEZADA ([id number]) [id number] Page 1 of 9 Visit Report for 08/17/2022 Chief Complaint Document Details Patient Name: Date of Service: [name] [facility] 08/17/2022 12:45 PM Medical Record Number: [id number] Patient Account Number: [id number] Date of Birth/Sex: Treating RN: 06-09-1946 (76 y.o. [name] Primary Care Provider: Fulton Reek Other Clinician: Massie Kluver Referring Provider: Treating Provider/Extender: Maurilio Lovely Weeks in Treatment: 8 Information Obtained from: Patient Chief Complaint Left heel ulcer Electronic Signature(s) Signed: 08/17/2022 12:56:12 PM By: Worthy Keeler PA-C Entered By: Worthy Keeler on 08/17/2022 12:56:12 -------------------------------------------------------------------------------- Debridement Details Patient Name: Date of Service: [name], [name] [name] 08/17/2022 12:45 PM Medical Record Number: [id number] Patient Account Number: [id number] Date of Birth/Sex: Treating RN: December 12, 1945 (76 y.o. [name] Primary Care Provider: Fulton Reek Other Clinician: Massie Kluver Referring Provider: Treating Provider/Extender: Maurilio Lovely Weeks in Treatment: 8 Debridement Performed for Assessment: Wound #10 Left Calcaneus Performed By: Physician Tommie Sams., PA-C Debridement Type: Debridement Severity of Tissue Pre Debridement: Fat layer exposed Level of Consciousness (Pre-procedure): Awake and Alert Pre-procedure Verification/Time Out Yes - 13:23 Taken: Start Time: 13:23 T Area Debrided (L x W): otal 1 (cm) x 1 (cm) = 1 (cm) Tissue and other material debrided: Viable, Non-Viable, Slough, Subcutaneous, Biofilm, Slough Level: Skin/Subcutaneous Tissue Debridement Description: Excisional Instrument: Curette Bleeding: Minimum Hemostasis Achieved: Pressure End Time: 13:26 Response to Treatment: Procedure was tolerated well Level of Consciousness (Post-  Awake and Alert procedure): [name], [name] ([id number]) [id number] Page 2 of 9 Post Debridement Measurements of Total Wound Length: (cm) 0.8 Width: (cm) 1.3 Depth: (cm) 0.1 Volume: (cm) 0.082 Character of Wound/Ulcer Post Debridement: Stable Severity of Tissue Post Debridement: Fat layer exposed Post Procedure Diagnosis Same as Pre-procedure Electronic Signature(s) Signed: 08/17/2022 4:20:16 PM By: Worthy Keeler PA-C Signed: 08/18/2022 12:02:04 PM By: [name], BSN, RN, CWS, [name] RN, BSN Entered By: Worthy Keeler on 08/17/2022 16:20:16 -------------------------------------------------------------------------------- HPI Details Patient Name: Date of Service: [name], [name] [name] 08/17/2022 12:45 PM Medical Record Number: [id number] Patient Account Number: [id number] Date of Birth/Sex: Treating RN: 12/11/1945 (76 y.o. [name] Primary Care Provider: Fulton Reek Other Clinician: Massie Kluver Referring Provider: Treating Provider/Extender: Maurilio Lovely Weeks in Treatment: 8 History of Present Illness HPI Description: 09/24/2020 on evaluation today patient presents today for a heel ulcer that he tells me has been present for about 2 years. He has been seeing podiatry and they have been attempting to manage this including what sounds to be a total contact cast, Unna boot, and just standard dressings otherwise as well. Most recently has been using triple antibiotic ointment. With that being said unfortunately despite everything he really has not had any significant improvement. He tells me that he cannot even really remember exactly how this began but he presumed it may have rubbed on his shoes or something of that nature. 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 He tells me that this does seem to help. Fortunately there is no signs of active infection at this time. His most recent hemoglobin A1c was 8.1 though he knows this was this year he cannot tell me the exact time. His fluid pills  currently to help with some of the lower extremity edema although he does obviously have signs of venous stasis/lymphedema. Currently there is no evidence of active infection. No fevers, chills, nausea, vomiting, or diarrhea. Patient has had fairly recent ABIs which were performed on 07/19/2020 and revealed that he has normal findings in both the ankle and toe locations bilaterally. His ABI on the right was 1.09 on the left was 1.08 with a TBI on the right of 0.88 and on the left of 0.94. Triphasic flow was noted throughout. 10/08/2020 on evaluation today patient appears to be doing pretty well in regard to his left heel currently in fact this is doing a great job and seems to be healing quite nicely. Unfortunately on his right leg he had a pile of wood that actually fell on him injuring his right leg this is somewhat erythematous has me concerned little bit about cellulitis though there is not really a good area to culture at this point. 10/24/2020 upon evaluation today patient appears to be doing well with regard to his heel ulcer. He is showing signs of improvement which is great news. His right leg is completely healed. Overall I feel like he is doing excellent and there is no signs of infection. 11/07/2020 upon evaluation today patient appears to be doing well with regard to his heel ulcer. He tells me that last week when he was unable to come in his wife actually thought that the wound was very close to closing if not closed. Then it began to "reopen again". I really feel like what may have happened as the collagen may have dried over the wound bed and that because  that misunderstanding with thinking that the wound was healing. With that being said I did not see it last week I do not know that for certain. Either way I feel like he is doing great today I see no signs of infection at this point. 11/26/2020 upon inspection today patient appears to be doing decently well in regard to his heel ulcer. He has been tolerating the dressing changes without complication. Fortunately there is no sign of active infection at this time. No fevers, chills, nausea, vomiting, or diarrhea. 12/10/2020 upon evaluation today patient appears to be doing fairly well in regard to the wound on his heel as well as what appears to be a new wound of the left first metatarsal head plantar aspect. This seems to be an area that was callus that has split as the patient tells me has been trying to walk on his toes more has probably where this came from. With that being said there does not appear to be signs of active infection which is great news. 12/17/2020 upon evaluation today patient actually appears to be making good progress currently. Fortunately there is no evidence of active infection at this time. Overall I feel like he is very close to complete closure. 12/26/2020 upon evaluation today patient appears to be doing excellent in regard to his wounds. In fact I am not certain that these are not even completely healed on initial inspection. Overall I am very pleased with where things stand today. Good news is that they are healed he is actually get ready to go out of town and that will be helpful as well as he will be a full part of the time. [name], [name] ([id number]) [id number] Page 3 of 9 Readmission: 02/04/2021 upon evaluation today patient appears to be doing well at this point in regard to  his left heel that I previously saw him for. Unfortunately he is having issues with his right lower extremity. He has significant wounds at this point he also has erythema  noted there is definite signs of cellulitis which is unfortunate. With that being said I think we do need to address this sooner rather than later. The good news is he did have a nice trip to the beach. He tells me that he had no issues during that time. 4/27; patient on Bactrim. Culture showed methicillin sensitive staph aureus therefore the Bactrim should be effective. 2 small areas on the right leg are healed the area on the mid aspect of the tibia almost 100% covered in a very adherent necrotic debris. We have been using silver alginate 02/20/2021 upon evaluation today patient appears to be doing well with regard to his wound. He is showing signs of improvement which is great news overall very pleased with where things stand today. No fevers, chills, nausea, vomiting, or diarrhea. 02/27/2021 upon evaluation today patient appears to be doing well with regard to his leg ulcer. He is tolerating the dressing changes and overall appears to be doing quite excellent. I am extremely pleased with where things stand and overall I think patient is making great progress. There is no sign of active infection at this time which is also great news. 03/06/2021 upon evaluation today patient appears to be doing excellent in regard to his wounds. In fact he appears to be completely healed today based on what I am seeing. This is excellent news and overall I am extremely pleased with where he stands. Overall the patient is happy to hear this as well this has been quite sometime coming. Readmission: 04/01/2021 patient unfortunately returns for readmission today. He tells me that he has been wearing his compression socks on the right leg daily and he does not really know what is going on and why his legs are doing what they are doing. We did therefore go ahead and probe deeper into exactly what has been going on with him today. Subsequently the patient tells me that when he gets up and what we would call "first thing in  the morning" are really 2 different things". He does not tend to sleep well so he tells me that he will often wake up at 3:00 in the morning. He will then potentially going into the living room to get in his chair where he may read a book for a little while and then potentially fall asleep back in his chair. He then subsequently wake up around 5:00 or so and then get up and in his words "putter around". This often will end with him proceeding at some point around 8 AM or 9 AM to putting on his compression socks. With that being said this means that anywhere from the 3:00 in the morning till roughly around 9:00 in the morning he has no compression on yet he is sitting in his recliner, walking around and up and about, and this is at least 5 to 6 hours of noncompressed time. That may be our issue here but I did not realize until we discussed this further today. Fortunately there does not appear to be any signs of active infection at this time which is great news. With that being said he has multiple wounds of the bilateral lower extremities. 04/10/2021 upon evaluation today patient appears to be doing well with regard to his legs for the most part. He does look like he  had some injury where his wrap slid down nonetheless he did some "doctoring on them". I do feel like most of the areas honestly have cleared back up which is great news. There does not appear to be any signs of active infection at this time which is also great news. No fevers, chills, nausea, vomiting, or diarrhea. 04/18/2021 upon evaluation today patient appears to be doing well with regard to his wounds in fact he appears to be completely healed which is great news. Fortunately there does not appear to be any signs of active infection which is great news. No fevers, chills, nausea, vomiting, or diarrhea. READMISSION 07/28/2021 This is a now 76 year old man we have had in this clinic for quite a bit of this year. He has been in here with  bilateral lower extremity leg wounds probably secondary to chronic venous insufficiency. He wears compression stockings. 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 He comes in an old running shoes although he says he wears better shoes at home. His history is that he noticed a blister in the right posterior heel. This open. He has been applying Neosporin to it currently the wound measures 2 x 1.4 cm. 100% slough covered. Not really offloading this in any rigorous way. Past medical history is essentially unchanged he has paroxysmal atrial fibrillation on Coumadin type 2 diabetes with a recent hemoglobin A1c of 7 on metformin. ABI in our clinic was 0.98 on the right 08/05/2021 upon evaluation today patient appears to be doing well with regard to his heel ulcer. Fortunately there does not appear to be any signs of active infection at this time. Overall I been very pleased with where things seem to be currently as far as the wound healing is concerned. Again this is first a lot of seeing him Dr. Dellia Nims readmitted him last week. Nonetheless I think we are definitely making some progress here. 08/11/2021 upon evaluation today patient's wound on the heel actually showing signs of excellent improvement. I am actually very pleased with where things stand currently. No fevers, chills, nausea, vomiting, or diarrhea. There does not appear to be any need for sharp debridement today either which is also great news. 08/25/2021 upon evaluation today patient appears to be doing well with regard to his heel ulcer. This is actually looking significantly improved compared to last time I saw him. Fortunately there does not appear to be any evidence of active infection at this time. No fevers, chills, nausea, vomiting, or diarrhea. 09/01/2021 upon evaluation today patient appears to be doing decently well in regard to his wounds. Fortunately there does not appear to be any signs  of active infection at this time which is great news and overall very pleased in that regard. I do not see any evidence of active infection systemically which is great news as well. No fevers, chills, nausea, vomiting, or diarrhea. I think that the heel is making excellent progress. 09/15/2021 upon evaluation today patient's heel unfortunately is significantly worse compared to what it was previous. I do believe that at this time he would benefit from switching back to something a little bit more offloading from the cushion shoe he has right now this is more of a heel protector what I really need is a Prevalon offloading boot which I think is good to do a lot better for him than just the small heel protector. 09/23/2021 upon evaluation today patient appears to be doing a little better in regards to  last week's visit. Overall I think that he is making good progress which is great news and there does not appear to be any evidence of active infection at this time. No fevers, chills, nausea, vomiting, or diarrhea. 09/30/2021 upon evaluation patient's heel is actually showing signs of good improvement which is great news. Fortunately there does not appear to be any evidence of active infection locally nor systemically at this point. No fevers, chills, nausea, vomiting, or diarrhea. 10/07/2021 upon evaluation today patient appears to be doing well currently in regard to his heel ulcer. He has been tolerating the dressing changes without complication. Fortunately I do not see any signs of active infection at this time which is great news. No fevers, chills, nausea, vomiting, or diarrhea. 12/27; wound is measuring smaller. We have been using silver alginate 10/28/2021 upon evaluation today patient appears to be doing well currently in regard to his heel ulcer. I am actually very pleased with where things stand and I think he is making excellent progress. Fortunately I do not see any signs of active infection  locally nor systemically at this point which is great news. Nonetheless I do believe that the patient would benefit from a new offloading shoe and has been using it Velcro is no longer functioning properly and he tells me that he almost tripped and fell because of that today. Obviously I do not want him following that would be very bad. 11/11/2021 upon evaluation today patient appears to be doing excellent in regard to his wound. In fact this appears to be completely healed based on what I see [name], [name] ([id number]) [id number] Page 4 of 9 currently. I do not see any signs of anything open or draining and I did double check just by clearing away some of the callus around the edges of the wound to ensure that there was nothing still open or hiding underneath. Once this was cleared away it appeared that the patient was doing significantly better at this time which is great news and there was nothing actually open. Readmission: 06-19-2022 upon evaluation today patient appears to be doing well currently in regard to his heel ulcer all things considered. He unfortunately is having a bit of breakdown in general as far as the heel is concerned not nearly as bad as what we noted last time he was here in the clinic. The good news is I do think that this should hopefully heal much more rapidly than what we noted last time. His past medical history has not changed he is on Coumadin. 07-03-2022 upon evaluation today patient appears to be doing better in regard to his heel. We will start to see some improvement here which is good news. Fortunately I do not see any evidence of infection locally or systemically at this time which is great news. No fevers, chills, nausea, vomiting, or diarrhea. 07-10-2022 upon evaluation today patient appears to be doing well currently in regard to his wound from the callus standpoint around the edges. Unfortunately from an actual wound bed standpoint he  has some deep tissue injury noted at this point. Again I am not sure exactly what is going on that is causing this pressure to the region but he is definitely gotten pressure that is occurring and causing this to not heal as effectively as what we would like to see. I discussed with him today that he may need to at night when he sleeping use a pillow up underneath his calf in order to prevent  the heel from touching the bed at all. I also think that it may be beneficial for him to monitor throughout his day and make sure there is no other time when he is getting the pressure to the heel. 07-23-2022 upon evaluation today patient appears to be doing poorly currently in regard to his heel ulcer. He has been tolerating the dressing changes without complication. Unfortunately I feel like he may be infected based on what I am seeing. 07-28-2022 I did review patient's culture results which showed positive for Proteus as well as Staphylococcus both of which would be treated with the Bactrim DS that I gave him at the last visit. This is good news and hopefully means that we should be able to apply the cast today as long as the wound itself does not appear to be doing poorly. Patient's wound bed actually showed signs of doing quite well and I am very pleased with where things stand and I do not see any signs of worsening overall which is great news. 08-04-2022 upon evaluation today patient appears to be doing well currently in regard to his heel ulcer. I am actually very pleased with where things stand and I do think this is looking significantly better. With regard to his right shin that is also showing signs of improvement which is great news. 08-10-2022 upon evaluation today patient's wound on the heel actually appears to be doing significantly better. In regard to the right anterior shin this is showing signs of healing it might even be completely healed but I still get a monitor I cannot get anything to fill  away but also could not see where there was anything actually draining at this point. I am tending to think healed but I want a monitor 1 more week before I call it for sure. 08-17-2022 upon evaluation today patient appears to be doing well currently in regard to his heel. Fortunately he is tolerating the dressing changes without complication. I do not see any signs of infection and overall I think he is making good progress here. We did get approval for the Apligraf but I think he had a $295 co-pay that would have to be paid per application. With that being said as good as he is doing right now I do not think that is necessary but is still an option depending on how things progress if we need to speed this up quite a bit. Electronic Signature(s) Signed: 08/17/2022 4:19:02 PM By: Worthy Keeler PA-C Entered By: Worthy Keeler on 08/17/2022 16:19:02 -------------------------------------------------------------------------------- Physical Exam Details Patient Name: Date of Service: [name] [facility] J. 08/17/2022 12:45 PM Medical Record Number: [id number] Patient Account Number: [id number] Date of Birth/Sex: Treating RN: 1946-05-06 (76 y.o. [name] Primary Care Provider: Fulton Reek Other Clinician: Massie Kluver Referring Provider: Treating Provider/Extender: Maurilio Lovely Weeks in Treatment: 8 Constitutional Well-nourished and well-hydrated in no acute distress. Respiratory normal breathing without difficulty. Psychiatric this patient is able to make decisions and demonstrates good insight into disease process. Alert and Oriented x 3. pleasant and cooperative. Notes Upon inspection patient's wound bed actually showed signs of good granulation and epithelization at this point. Fortunately I do not see any evidence of infection locally or systemically which is great news and overall I am extremely pleased with where we stand today. Electronic  Signature(s) Signed: 08/17/2022 4:19:18 PM By: Derry Lory [phone] By: Worthy Keeler PA-C [id number] Page 5 of 9 Signed:  08/17/2022 4:19:18 Entered By: Worthy Keeler on 08/17/2022 16:19:17 -------------------------------------------------------------------------------- Physician Orders Details Patient Name: Date of Service: [name] [facility] J. 08/17/2022 12:45 PM Medical Record Number: [id number] Patient Account Number: [id number] Date of Birth/Sex: Treating RN: 18-Jul-1946 (76 y.o. [name] Primary Care Provider: Fulton Reek Other Clinician: Massie Kluver Referring Provider: Treating Provider/Extender: [name] in Treatment: 8 Verbal / Phone Orders: No Diagnosis Coding Follow-up Appointments Wound #10 Left Calcaneus Return Appointment in 1 week. Bathing/ Shower/ Hygiene May shower; gently cleanse wound with antibacterial soap, rinse and pat dry prior to dressing wounds Non-Wound Condition dditional non-wound orders/instructions: - Apply 40% Urea cream to dry cracked skin on feet. A Off-Loading Open toe surgical shoe with peg assist. Wound Treatment Wound #10 - Calcaneus Wound Laterality: Left Prim Dressing: Prisma 4.34 (in) (Generic) 3 x Per Week/30 Days ary Discharge Instructions: Moisten w/normal saline or sterile water; Cover wound as directed. Do not remove from wound bed. Secondary Dressing: ABD Pad 5x9 (in/in) (Generic) 3 x Per Week/30 Days Discharge Instructions: Cover with ABD pad Secured With: ACE WRAP - 64M ACE Elastic Bandage With VELCRO Brand Closure, 4 (in) 3 x Per Week/30 Days Secured With: Conform 4'' - Conforming Stretch [name] Bandage 4x75 (in/in) 3 x Per Week/30 Days Discharge Instructions: Apply as directed Electronic Signature(s) Signed: 08/17/2022 4:57:47 PM By: Worthy Keeler PA-C Signed: 08/17/2022 5:32:10 PM By: Massie Kluver Entered By: Massie Kluver  on 08/17/2022 13:28:48 [name] ([id number]) [id number] Page 6 of 9 -------------------------------------------------------------------------------- Progress Note Details Patient Name: Date of Service: [name] 08/17/2022 12:45 PM Medical Record Number: [id number] Patient Account Number: [id number] Date of Birth/Sex: Treating RN: December 30, 1945 (76 y.o. [name] Primary Care Provider: Fulton Reek Other Clinician: Massie Kluver Referring Provider: Treating Provider/Extender: Maurilio Lovely Weeks in Treatment: 8 Subjective Chief Complaint Information obtained from Patient Left heel ulcer History of Present Illness (HPI) 09/24/2020 on evaluation today patient presents today for a heel ulcer that he tells me has been present for about 2 years. He has been seeing podiatry and they have been attempting to manage this including what sounds to be a total contact cast, Unna boot, and just standard dressings otherwise as well. Most recently has been using triple antibiotic ointment. With that being said unfortunately despite everything he really has not had any significant improvement. He tells me that he cannot even really remember exactly how this began but he presumed it may have rubbed on his shoes or something of that nature. 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 He tells me that this does seem to help. Fortunately there is no signs of active infection at this time. His most recent hemoglobin A1c was 8.1 though he knows this was this year he cannot tell me the exact time. His fluid pills currently to help with some of the lower extremity edema  although he does obviously have signs of venous stasis/lymphedema. Currently there is no evidence of active infection. No fevers, chills, nausea, vomiting, or diarrhea. Patient has had fairly recent ABIs which were performed on 07/19/2020 and revealed that he has normal findings in both the ankle and toe locations bilaterally. His  ABI on the right was 1.09 on the left was 1.08 with a TBI on the right of 0.88 and on the left of 0.94. Triphasic flow was noted throughout. 10/08/2020 on evaluation today patient appears to be doing pretty well in regard to his left heel currently in fact this is doing a great job and seems to be healing quite nicely. Unfortunately on his right leg he had a pile of wood that actually fell on him injuring his right leg this is somewhat erythematous has me concerned little bit about cellulitis though there is not really a good area to culture at this point. 10/24/2020 upon evaluation today patient appears to be doing well with regard to his heel ulcer. He is showing signs of improvement which is great news. His right leg is completely healed. Overall I feel like he is doing excellent and there is no signs of infection. 11/07/2020 upon evaluation today patient appears to be doing well with regard to his heel ulcer. He tells me that last week when he was unable to come in his wife actually thought that the wound was very close to closing if not closed. Then it began to "reopen again". I really feel like what may have happened as the collagen may have dried over the wound bed and that because that misunderstanding with thinking that the wound was healing. With that being said I did not see it last week I do not know that for certain. Either way I feel like he is doing great today I see no signs of infection at this point. 11/26/2020 upon inspection today patient appears to be doing decently well in regard to his heel ulcer. He has been tolerating the dressing changes  without complication. Fortunately there is no sign of active infection at this time. No fevers, chills, nausea, vomiting, or diarrhea. 12/10/2020 upon evaluation today patient appears to be doing fairly well in regard to the wound on his heel as well as what appears to be a new wound of the left first metatarsal head plantar aspect. This seems to be an area that was callus that has split as the patient tells me has been trying to walk on his toes more has probably where this came from. With that being said there does not appear to be signs of active infection which is great news. 12/17/2020 upon evaluation today patient actually appears to be making good progress currently. Fortunately there is no evidence of active infection at this time. Overall I feel like he is very close to complete closure. 12/26/2020 upon evaluation today patient appears to be doing excellent in regard to his wounds. In fact I am not certain that these are not even completely healed on initial inspection. Overall I am very pleased with where things stand today. Good news is that they are healed he is actually get ready to go out of town and that will be helpful as well as he will be a full part of the time. Readmission: 02/04/2021 upon evaluation today patient appears to be doing well at this point in regard to his left heel that I previously saw him for. Unfortunately he is having issues with his right lower extremity. He has significant wounds at this point he also has erythema noted there is definite signs of cellulitis which is unfortunate. With that being said I think we do need to address this sooner rather than later. The good news is he did have a nice trip to the beach. He  tells me that he had no issues during that time. 4/27; patient on Bactrim. Culture showed methicillin sensitive staph aureus therefore the Bactrim should be effective. 2 small areas on the right leg are healed the area on the mid aspect of the tibia  almost 100% covered in a very adherent necrotic debris. We have been using silver alginate 02/20/2021 upon evaluation today patient appears to be doing well with regard to his wound. He is showing signs of improvement which is great news overall very pleased with where things stand today. No fevers, chills, nausea, vomiting, or diarrhea. 02/27/2021 upon evaluation today patient appears to be doing well with regard to his leg ulcer. He is tolerating the dressing changes and overall appears to be doing quite excellent. I am extremely pleased with where things stand and overall I think patient is making great progress. There is no sign of active infection at this time which is also great news. 03/06/2021 upon evaluation today patient appears to be doing excellent in regard to his wounds. In fact he appears to be completely healed today based on what I am seeing. This is excellent news and overall I am extremely pleased with where he stands. Overall the patient is happy to hear this as well this has been quite sometime coming. Readmission: 04/01/2021 patient unfortunately returns for readmission today. He tells me that he has been wearing his compression socks on the right leg daily and he does not really know what is going on and why his legs are doing what they are doing. We did therefore go ahead and probe deeper into exactly what has been going on with him today. Subsequently the patient tells me that when he gets up and what we would call "first thing in the morning" are really 2 different things". He does not tend to sleep well so he tells me that he will often wake up at 3:00 in the morning. He will then potentially going into the living room to get in his chair where he may read a book for a little while and then potentially fall asleep back in his chair. He then subsequently wake up around 5:00 or so and then get up and in his words "putter around". This often will end with him proceeding at some  point around 8 AM or 9 AM to putting on his compression socks. With that being said this means that anywhere from the 3:00 in the morning till roughly around 9:00 in the morning he has no compression on yet he is sitting in his recliner, walking around and up and about, and this is at least 5 to 6 hours of noncompressed time. That may be our issue here but I did not realize until we discussed this further today. Fortunately there does not appear to be any signs of active infection at this time which is great news. With that being said he has multiple wounds of the bilateral lower extremities. [name], [name] ([id number]) [id number] Page 7 of 9 04/10/2021 upon evaluation today patient appears to be doing well with regard to his legs for the most part. He does look like he had some injury where his wrap slid down nonetheless he did some "doctoring on them". I do feel like most of the areas honestly have cleared back up which is great news. There does not appear to be any signs of active infection at this time which is also great news. No fevers, chills, nausea, vomiting, or diarrhea. 04/18/2021 upon evaluation  today patient appears to be doing well with regard to his wounds in fact he appears to be completely healed which is great news. Fortunately there does not appear to be any signs of active infection which is great news. No fevers, chills, nausea, vomiting, or diarrhea. READMISSION 07/28/2021 This is a now 76 year old man we have had in this clinic for quite a bit of this year. 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 He wears compression stockings. 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 He comes in an old running shoes although he says he wears better shoes at home. His history is that he noticed a blister in the right posterior heel. This open. He has  been applying Neosporin to it currently the wound measures 2 x 1.4 cm. 100% slough covered. Not really offloading this in any rigorous way. Past medical history is essentially unchanged he has paroxysmal atrial fibrillation on Coumadin type 2 diabetes with a recent hemoglobin A1c of 7 on metformin. ABI in our clinic was 0.98 on the right 08/05/2021 upon evaluation today patient appears to be doing well with regard to his heel ulcer. Fortunately there does not appear to be any signs of active infection at this time. Overall I been very pleased with where things seem to be currently as far as the wound healing is concerned. Again this is first a lot of seeing him Dr. Dellia Nims readmitted him last week. Nonetheless I think we are definitely making some progress here. 08/11/2021 upon evaluation today patient's wound on the heel actually showing signs of excellent improvement. I am actually very pleased with where things stand currently. No fevers, chills, nausea, vomiting, or diarrhea. There does not appear to be any need for sharp debridement today either which is also great news. 08/25/2021 upon evaluation today patient appears to be doing well with regard to his heel ulcer. This is actually looking significantly improved compared to last time I saw him. Fortunately there does not appear to be any evidence of active infection at this time. No fevers, chills, nausea, vomiting, or diarrhea. 09/01/2021 upon evaluation today patient appears to be doing decently well in regard to his wounds. Fortunately there does not appear to be any signs of active infection at this time which is great news and overall very pleased in that regard. I do not see any evidence of active infection systemically which is great news as well. No fevers, chills, nausea, vomiting, or diarrhea. I think that the heel is making excellent progress. 09/15/2021 upon evaluation today patient's heel unfortunately is significantly worse compared  to what it was previous. I do believe that at this time he would benefit from switching back to something a little bit more offloading from the cushion shoe he has right now this is more of a heel protector what I really need is a Prevalon offloading boot which I think is good to do a lot better for him than just the small heel protector. 09/23/2021 upon evaluation today patient appears to be doing a little better in regards to last week's visit. Overall I think that he is making good progress which is great news and there does not appear to be any evidence of active infection at this time. No fevers, chills, nausea, vomiting, or diarrhea. 09/30/2021 upon evaluation patient's heel is actually showing signs of good improvement which is great news. Fortunately there does not appear to be  any evidence of active infection locally nor systemically at this point. No fevers, chills, nausea, vomiting, or diarrhea. 10/07/2021 upon evaluation today patient appears to be doing well currently in regard to his heel ulcer. He has been tolerating the dressing changes without complication. Fortunately I do not see any signs of active infection at this time which is great news. No fevers, chills, nausea, vomiting, or diarrhea. 12/27; wound is measuring smaller. We have been using silver alginate 10/28/2021 upon evaluation today patient appears to be doing well currently in regard to his heel ulcer. I am actually very pleased with where things stand and I think he is making excellent progress. Fortunately I do not see any signs of active infection locally nor systemically at this point which is great news. Nonetheless I do believe that the patient would benefit from a new offloading shoe and has been using it Velcro is no longer functioning properly and he tells me that he almost tripped and fell because of that today. Obviously I do not want him following that would be very bad. 11/11/2021 upon evaluation today patient  appears to be doing excellent in regard to his wound. In fact this appears to be completely healed based on what I see currently. I do not see any signs of anything open or draining and I did double check just by clearing away some of the callus around the edges of the wound to ensure that there was nothing still open or hiding underneath. Once this was cleared away it appeared that the patient was doing significantly better at this time which is great news and there was nothing actually open. Readmission: 06-19-2022 upon evaluation today patient appears to be doing well currently in regard to his heel ulcer all things considered. He unfortunately is having a bit of breakdown in general as far as the heel is concerned not nearly as bad as what we noted last time he was here in the clinic. The good news is I do think that this should hopefully heal much more rapidly than what we noted last time. His past medical history has not changed he is on Coumadin. 07-03-2022 upon evaluation today patient appears to be doing better in regard to his heel. We will start to see some improvement here which is good news. Fortunately I do not see any evidence of infection locally or systemically at this time which is great news. No fevers, chills, nausea, vomiting, or diarrhea. 07-10-2022 upon evaluation today patient appears to be doing well currently in regard to his wound from the callus standpoint around the edges. Unfortunately from an actual wound bed standpoint he has some deep tissue injury noted at this point. Again I am not sure exactly what is going on that is causing this pressure to the region but he is definitely gotten pressure that is occurring and causing this to not heal as effectively as what we would like to see. I discussed with him today that he may need to at night when he sleeping use a pillow up underneath his calf in order to prevent the heel from touching the bed at all. I also think that it may  be beneficial for him to monitor throughout his day and make sure there is no other time when he is getting the pressure to the heel. 07-23-2022 upon evaluation today patient appears to be doing poorly currently in regard to his heel ulcer. He has been tolerating the dressing changes without complication. Unfortunately I feel like  he may be infected based on what I am seeing. 07-28-2022 I did review patient's culture results which showed positive for Proteus as well as Staphylococcus both of which would be treated with the Bactrim DS that I gave him at the last visit. This is good news and hopefully means that we should be able to apply the cast today as long as the wound itself does not appear to be doing poorly. Patient's wound bed actually showed signs of doing quite well and I am very pleased with where things stand and I do not see any signs of worsening overall which is great news. 08-04-2022 upon evaluation today patient appears to be doing well currently in regard to his heel ulcer. I am actually very pleased with where things stand and I do think this is looking significantly better. With regard to his right shin that is also showing signs of improvement which is great news. 08-10-2022 upon evaluation today patient's wound on the heel actually appears to be doing significantly better. In regard to the right anterior shin this is showing signs of healing it might even be completely healed but I still get a monitor I cannot get anything to fill away but also could not see where there was anything actually draining at this point. I am tending to think healed but I want a monitor 1 more week before I call it for sure. 08-17-2022 upon evaluation today patient appears to be doing well currently in regard to his heel. Fortunately he is tolerating the dressing changes without [name], [name] ([id number]) [id number] Page 8 of 9 complication. I do not see any signs of  infection and overall I think he is making good progress here. We did get approval for the Apligraf but I think he had a $295 co-pay that would have to be paid per application. With that being said as good as he is doing right now I do not think that is necessary but is still an option depending on how things progress if we need to speed this up quite a bit. Objective Constitutional Well-nourished and well-hydrated in no acute distress. Vitals Time Taken: 1:01 PM, Height: 70 in, Weight: 265 lbs, BMI: 38, Temperature: 98.3 F, Pulse: 66 bpm, Respiratory Rate: 18 breaths/min, Blood Pressure: 168/70 mmHg. Respiratory normal breathing without difficulty. Psychiatric this patient is able to make decisions and demonstrates good insight into disease process. Alert and Oriented x 3. pleasant and cooperative. General Notes: Upon inspection patient's wound bed actually showed signs of good granulation and epithelization at this point. Fortunately I do not see any evidence of infection locally or systemically which is great news and overall I am extremely pleased with where we stand today. Integumentary (Hair, Skin) Wound #10 status is Open. Original cause of wound was Gradually Appeared. The date acquired was: 06/05/2022. The wound has been in treatment 8 weeks. The wound is located on the Left Calcaneus. The wound measures 0.8cm length x 1.3cm width x 0.1cm depth; 0.817cm^2 area and 0.082cm^3 volume. There is Fat Layer (Subcutaneous Tissue) exposed. There is a medium amount of serous drainage noted. The wound margin is flat and intact. There is small (1-33%) pink granulation within the wound bed. There is a large (67-100%) amount of necrotic tissue within the wound bed including Adherent Slough. Wound #12 status is Healed - Epithelialized. Original cause of wound was Blister. The date acquired was: 07/28/2022. The wound has been in treatment 2 weeks. The wound is located on the [place]  Leg.  The wound measures 0cm length x 0cm width x 0cm depth; 0cm^2 area and 0cm^3 volume. There is no tunneling or undermining noted. There is a none present amount of drainage noted. The wound margin is flat and intact. There is no granulation within the wound bed. There is no necrotic tissue within the wound bed. Procedures Wound #10 Pre-procedure diagnosis of Wound #10 is a Diabetic Wound/Ulcer of the Lower Extremity located on the Left Calcaneus .Severity of Tissue Pre Debridement is: Fat layer exposed. There was a Excisional Skin/Subcutaneous Tissue Debridement with a total area of 1 sq cm performed by Tommie Sams., PA-C. With the following instrument(s): Curette to remove Viable and Non-Viable tissue/material. Material removed includes Subcutaneous Tissue, Slough, and Biofilm. A time out was conducted at 13:23, prior to the start of the procedure. A Minimum amount of bleeding was controlled with Pressure. The procedure was tolerated well. Post Debridement Measurements: 0.8cm length x 1.3cm width x 0.1cm depth; 0.082cm^3 volume. Character of Wound/Ulcer Post Debridement is stable. Severity of Tissue Post Debridement is: Fat layer exposed. Post procedure Diagnosis Wound #10: Same as Pre-Procedure Plan Follow-up Appointments: Wound #10 Left Calcaneus: Return Appointment in 1 week. Bathing/ Shower/ Hygiene: May shower; gently cleanse wound with antibacterial soap, rinse and pat dry prior to dressing wounds Non-Wound Condition: Additional non-wound orders/instructions: - Apply 40% Urea cream to dry cracked skin on feet. Off-Loading: Open toe surgical shoe with peg assist. WOUND #10: - Calcaneus Wound Laterality: Left Prim Dressing: Prisma 4.34 (in) (Generic) 3 x Per Week/30 Days ary Discharge Instructions: Moisten w/normal saline or sterile water; Cover wound as directed. Do not remove from wound bed. Secondary Dressing: ABD Pad 5x9 (in/in) (Generic) 3 x Per Week/30 Days Discharge  Instructions: Cover with ABD pad Secured With: ACE WRAP - 62M ACE Elastic Bandage With VELCRO Brand Closure, 4 (in) 3 x Per Week/30 Days Secured With: Conform 4'' - Conforming Stretch [name] Bandage 4x75 (in/in) 3 x Per Week/30 Days Discharge Instructions: Apply as directed 1. I Minna recommend that we have the patient going continue with the wound care measures as before and he is in agreement with the plan this includes the use of the silver collagen followed by ABD pad and an Ace wrap to cover. 2. I am also can recommend that he continue with the heel offloading shoe which I think is doing excellent for him. We will see patient back for reevaluation in 1 week here in the clinic. If anything worsens or changes patient will contact our office for additional [name], [name] ([id number]) [id number] Page 9 of 9 recommendations. Electronic Signature(s) Signed: 08/17/2022 4:21:00 PM By: Worthy Keeler PA-C Previous Signature: 08/17/2022 4:19:43 PM Version By: Worthy Keeler PA-C Entered By: Worthy Keeler on 08/17/2022 16:20:59 -------------------------------------------------------------------------------- SuperBill Details Patient Name: Date of Service: [name], [name] [name] 08/17/2022 Medical Record Number: [id number] Patient Account Number: [id number] Date of Birth/Sex: Treating RN: [birth year] (76 y.o. 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

## 2022-08-24 ENCOUNTER — Encounter: Payer: Medicare HMO | Attending: Physician Assistant | Admitting: Internal Medicine

## 2022-08-24 DIAGNOSIS — M1990 Unspecified osteoarthritis, unspecified site: Secondary | ICD-10-CM | POA: Insufficient documentation

## 2022-08-24 DIAGNOSIS — E1151 Type 2 diabetes mellitus with diabetic peripheral angiopathy without gangrene: Secondary | ICD-10-CM | POA: Insufficient documentation

## 2022-08-24 DIAGNOSIS — E11621 Type 2 diabetes mellitus with foot ulcer: Secondary | ICD-10-CM | POA: Diagnosis present

## 2022-08-24 DIAGNOSIS — I480 Paroxysmal atrial fibrillation: Secondary | ICD-10-CM | POA: Diagnosis not present

## 2022-08-24 DIAGNOSIS — I872 Venous insufficiency (chronic) (peripheral): Secondary | ICD-10-CM | POA: Insufficient documentation

## 2022-08-24 DIAGNOSIS — L97522 Non-pressure chronic ulcer of other part of left foot with fat layer exposed: Secondary | ICD-10-CM | POA: Insufficient documentation

## 2022-08-24 DIAGNOSIS — E1142 Type 2 diabetes mellitus with diabetic polyneuropathy: Secondary | ICD-10-CM | POA: Diagnosis not present

## 2022-08-24 DIAGNOSIS — Z7901 Long term (current) use of anticoagulants: Secondary | ICD-10-CM | POA: Diagnosis not present

## 2022-08-24 DIAGNOSIS — I10 Essential (primary) hypertension: Secondary | ICD-10-CM | POA: Diagnosis not present

## 2022-08-24 DIAGNOSIS — G8929 Other chronic pain: Secondary | ICD-10-CM | POA: Diagnosis not present

## 2022-08-26 NOTE — Progress Notes (Signed)
WITTEN, CERTAIN ([id number]) [id number] Page 1 of 9 Visit Report for 08/24/2022 Arrival Information Details Patient Name: Date of Service: [name], [name]. 08/24/2022 1:30 PM Medical Record Number: [id number] Patient Account Number: [id number] Date of Birth/Sex: Treating RN: 08-02-1946 (76 y.o. [name] Primary Care Mariette Cowley: Fulton Reek Other Clinician: Massie Kluver Referring Harrie Cazarez: Treating Darrien Laakso/Extender: RO BSO Delane Ginger, MICHA EL Ginny Forth in Treatment: 9 Visit Information History Since Last Visit All ordered tests and consults were completed: No Patient Arrived: [name] Added or deleted any medications: No Arrival Time: 13:29 Any new allergies or adverse reactions: No Transfer Assistance: None Had a fall or experienced change in Yes Patient Requires Transmission-Based Precautions: No activities of daily living that may affect Patient Has Alerts: Yes risk of falls: Patient Alerts: Patient on Blood Thinner Signs or symptoms of abuse/neglect since last visito No Warfarin Hospitalized since last visit: No Type II Diabetic Pain Present Now: No Electronic Signature(s) Signed: 08/25/2022 5:15:48 PM By: Massie Kluver Entered By: Massie Kluver on 08/24/2022 13:39:09 -------------------------------------------------------------------------------- Clinic Level of Care Assessment Details Patient Name: Date of Service: [name], [name] [facility] J. 08/24/2022 1:30 PM Medical Record Number: [id number] Patient Account Number: [id number] Date of Birth/Sex: Treating RN: 11-13-1945 (76 y.o. [name] Primary Care Jarian Longoria: Fulton Reek Other Clinician: Massie Kluver Referring Tay Whitwell: Treating Janney Priego/Extender: RO BSO N, MICHA EL Ginny Forth in Treatment: 9 Clinic Level of Care Assessment Items TOOL 4 Quantity Score _0  - 0 Use when only an EandM is performed on FOLLOW-UP visit ASSESSMENTS - Nursing Assessment /  Reassessment X- 1 10 Reassessment of Co-morbidities (includes updates in patient status) X- 1 5 Reassessment of Adherence to Treatment Plan ASSESSMENTS - Wound and Skin A ssessment / Reassessment X - Simple Wound Assessment / Reassessment - one wound 1 5 [name], [name] ([id number]) [id number] Page 2 of 9 _1  - 0 Complex Wound Assessment / Reassessment - multiple wounds _2  - 0 Dermatologic / Skin Assessment (not related to wound area) ASSESSMENTS - Focused Assessment _3  - 0 Circumferential Edema Measurements - multi extremities _4  - 0 Nutritional Assessment / Counseling / Intervention _5  - 0 Lower Extremity Assessment (monofilament, tuning fork, pulses) _6  - 0 Peripheral Arterial Disease Assessment (using hand held doppler) ASSESSMENTS - Ostomy and/or Continence Assessment and Care _7  - 0 Incontinence Assessment and Management _8  - 0 Ostomy Care Assessment and Management (repouching, etc.) PROCESS - Coordination of Care X - Simple Patient / Family Education for ongoing care 1 15 _9  - 0 Complex (extensive) Patient / Family Education for ongoing care _10  - 0 Staff obtains Programmer, systems, Records, T Results / Process Orders est _11  - 0 Staff telephones HHA, Nursing Homes / Clarify orders / etc _12  - 0 Routine Transfer to another Facility (non-emergent condition) _13  - 0 Routine Hospital Admission (non-emergent condition) _14  - 0 New Admissions / Biomedical engineer / Ordering NPWT Apligraf, etc. , _15  - 0 Emergency Hospital Admission (emergent condition) X- 1 10 Simple Discharge Coordination _16  - 0 Complex (extensive) Discharge Coordination PROCESS - Special Needs _17  - 0 Pediatric / Minor Patient Management _18  - 0 Isolation Patient Management _19  - 0 Hearing / Language / Visual special needs _20  - 0 Assessment of Community assistance (transportation, D/C planning, etc.) _21  - 0 Additional assistance / Altered mentation _22  - 0 Support  Surface(s) Assessment (bed, cushion, seat, etc.) INTERVENTIONS - Wound Cleansing / Measurement X - Simple Wound Cleansing - one wound 1 5 _23  - 0  Complex Wound Cleansing - multiple wounds X- 1 5 Wound Imaging (photographs - any number of wounds) _0  - 0 Wound Tracing (instead of photographs) X- 1 5 Simple Wound Measurement - one wound _1  - 0 Complex Wound Measurement - multiple wounds INTERVENTIONS - Wound Dressings _2  - 0 Small Wound Dressing one or multiple wounds X- 1 15 Medium Wound Dressing one or multiple wounds _3  - 0 Large Wound Dressing one or multiple wounds <ZOXWRUEAVWUJWJXB>_1<\/YNWGNFAOZHYQMVHQ>_4  - 0 Application of Medications - topical <ONGEXBMWUXLKGMWN>_0<\/UVOZDGUYQIHKVQQV>_9  - 0 Application of Medications - injection INTERVENTIONS - Miscellaneous _6  - 0 External ear exam _7  - 0 Specimen Collection (cultures, biopsies, blood, body fluids, etc.) _8  - 0 Specimen(s) / Culture(s) sent or taken to Lab for analysis [name], [name] ([id number]) [id number] Page 3 of 9 _9  - 0 Patient Transfer (multiple staff / Civil Service fast streamer / Similar devices) _10  - 0 Simple Staple / Suture removal (25 or less) _11  - 0 Complex Staple / Suture removal (26 or more) _12  - 0 Hypo / Hyperglycemic Management (close monitor of Blood Glucose) _13  - 0 Ankle / Brachial Index (ABI) - do not check if billed separately X- 1 5 Vital Signs Has the patient been seen at the hospital within the last three years: Yes Total Score: 80 Level Of Care: New/Established - Level 3 Electronic Signature(s) Signed: 08/25/2022 5:15:48 PM By: Massie Kluver Entered By: Massie Kluver on 08/24/2022 14:07:59 -------------------------------------------------------------------------------- Encounter Discharge Information Details Patient Name: Date of Service: [name], [name] [name] 08/24/2022 1:30 PM Medical Record Number: [id number] Patient Account Number: [id number] Date of Birth/Sex: Treating RN: February 02, 1946 (76 y.o. [name] Primary Care Roi Jafari: Fulton Reek Other Clinician: Massie Kluver Referring Lilias Lorensen: Treating Adhya Cocco/Extender: RO BSO Delane Ginger, MICHA EL Ginny Forth in Treatment: 9 Encounter Discharge Information Items Discharge Condition: Stable Ambulatory Status: Cane Discharge Destination: Home Transportation: Other Accompanied By: self Schedule Follow-up Appointment: Yes Clinical Summary of Care: Electronic Signature(s) Signed: 08/25/2022 5:15:48 PM By: Massie Kluver Entered By: Massie Kluver on 08/24/2022 14:19:40 -------------------------------------------------------------------------------- Lower Extremity Assessment Details Patient Name: Date of Service: [name], [name] [name] 08/24/2022 1:30 PM Medical Record Number: [id number] Patient Account Number: [id number] Date of Birth/Sex: Treating RN: 01/03/1946 (76 y.o. [name] Primary Care Rossy Virag: Fulton Reek Other Clinician: Buckley, Bradly ([id number]) [id number] Page 4 of 9 Referring Niylah Hassan: Treating Javonni Macke/Extender: RO BSO N, MICHA EL Bo Merino Weeks in Treatment: 9 Edema Assessment Assessed: [Left: Yes] [Right: No] Edema: [Left: Ye] [Right: s] Calf Left: Right: Point of Measurement: 33 cm From Medial Instep 41.4 cm Ankle Left: Right: Point of Measurement: 11 cm From Medial Instep 25 cm Vascular Assessment Pulses: Dorsalis Pedis Palpable: [Left:Yes] Electronic Signature(s) Signed: 08/25/2022 8:23:09 AM By: [name], BSN, RN, CWS, [name] RN, BSN Signed: 08/25/2022 5:15:48 PM By: Massie Kluver Entered By: Massie Kluver on 08/24/2022 13:50:16 -------------------------------------------------------------------------------- Multi Wound Chart Details Patient Name: Date of Service: [name], [name] [name] 08/24/2022 1:30 PM Medical Record Number: [id number] Patient Account Number: [id number] Date of Birth/Sex: Treating RN: 01-24-1946 (76 y.o. [name] Primary Care Joeanthony Seeling: Fulton Reek Other Clinician: Massie Kluver Referring Remee Charley: Treating Jos Cygan/Extender: RO BSO N, MICHA EL Ginny Forth in Treatment: 9 Vital Signs Height(in): 70 Pulse(bpm): 24 Weight(lbs): 265 Blood Pressure(mmHg): 124/62 Body Mass Index(BMI): 38 Temperature(F): 98.3 Respiratory Rate(breaths/min): 18 [10:Photos:] [N/A:N/A] Left Calcaneus N/A N/A Wound Location: Gradually Appeared N/A N/A Wounding 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  N/A N/A Secondary Etiology: ZAEEM, KANDEL ([id number]) [id number] Page 5 of 9 Cataracts, Arrhythmia, Coronary N/A N/A Comorbid History: Artery Disease, Hypertension, Type II Diabetes, Osteoarthritis, Neuropathy 06/05/2022 N/A N/A Date Acquired: 9 N/A N/A Weeks of Treatment: Open N/A N/A Wound Status: No N/A N/A Wound Recurrence: 1.3x2.2x0.1 N/A N/A Measurements L x W x D (cm) 2.246 N/A N/A A (cm) : rea 0.225 N/A N/A Volume (cm) : -58.80% N/A N/A % Reduction in A rea: 20.50% N/A N/A % Reduction in Volume: Grade 1 N/A N/A Classification: Medium N/A N/A Exudate A mount: Serous N/A N/A Exudate Type: amber N/A N/A Exudate Color: Flat and Intact N/A N/A Wound Margin: Small (1-33%) N/A N/A Granulation A mount: Pink N/A N/A Granulation Quality: Large (67-100%) N/A N/A Necrotic A mount: Fat Layer (Subcutaneous Tissue): Yes N/A N/A Exposed Structures: Fascia: No Tendon: No Muscle: No Joint: No Bone: No Small (1-33%) N/A N/A Epithelialization: Treatment Notes Electronic Signature(s) Signed: 08/25/2022 5:15:48 PM By: Massie Kluver Entered By: Massie Kluver on 08/24/2022 13:50:48 -------------------------------------------------------------------------------- Multi-Disciplinary Care Plan Details Patient Name: Date of Service: [name], [name] [name] 08/24/2022 1:30 PM Medical Record Number: [id number] Patient Account Number:  [id number] Date of Birth/Sex: Treating RN: Dec 27, 1945 (76 y.o. [name] Primary Care Tatsuo Musial: Fulton Reek Other Clinician: Massie Kluver Referring Raysa Bosak: Treating Siaosi Alter/Extender: RO BSO N, MICHA EL Ginny Forth in Treatment: 9 Active Inactive Pressure Nursing Diagnoses: Knowledge deficit related to causes and risk factors for pressure ulcer development Knowledge deficit related to management of pressures ulcers Potential for impaired tissue integrity related to pressure, friction, moisture, and shear Goals: Patient will remain free from development of additional pressure ulcers Date Initiated: 06/19/2022 Date Inactivated: 07/28/2022 Target Resolution Date: 06/19/2022 Goal Status: Met Patient/caregiver will verbalize risk factors for pressure ulcer development Date Initiated: 06/19/2022 Date Inactivated: 07/28/2022 Target Resolution Date: 06/19/2022 Goal Status: Met Patient/caregiver will verbalize understanding of pressure ulcer management Date Initiated: 06/19/2022 Target Resolution Date: 08/14/2022 Goal Status: Active [name], [name] ([id number]) [id number] Page 6 of 9 Interventions: Assess: immobility, friction, shearing, incontinence upon admission and as needed Assess offloading mechanisms upon admission and as needed Assess potential for pressure ulcer upon admission and as needed Provide education on pressure ulcers Notes: Wound/Skin Impairment Nursing Diagnoses: Impaired tissue integrity Goals: Patient/caregiver will verbalize understanding of skin care regimen Date Initiated: 06/19/2022 Date Inactivated: 07/10/2022 Target Resolution Date: 06/19/2022 Goal Status: Met Ulcer/skin breakdown will have a volume reduction of 30% by week 4 Date Initiated: 06/19/2022 Date Inactivated: 07/28/2022 Target Resolution Date: 07/17/2022 Goal Status: Unmet Unmet Reason: infection Ulcer/skin breakdown will have a volume reduction of 50%  by week 8 Date Initiated: 07/28/2022 Target Resolution Date: 08/14/2022 Goal Status: Active Interventions: Assess patient/caregiver ability to obtain necessary supplies Assess patient/caregiver ability to perform ulcer/skin care regimen upon admission and as needed Assess ulceration(s) every visit Provide education on ulcer and skin care Treatment Activities: Skin care regimen initiated : 06/19/2022 Topical wound management initiated : 06/19/2022 Notes: Electronic Signature(s) Signed: 08/25/2022 8:23:09 AM By: [name], BSN, RN, CWS, [name] RN, BSN Signed: 08/25/2022 5:15:48 PM By: Massie Kluver Entered By: Massie Kluver on 08/24/2022 13:50:21 -------------------------------------------------------------------------------- Pain Assessment Details Patient Name: Date of Service: [name], [name] [name] 08/24/2022 1:30 PM Medical Record Number: [id number] Patient Account Number: [id number] Date of Birth/Sex: Treating RN: 03/31/1946 (76 y.o. [name] Primary Care Vianney Kopecky: Fulton Reek Other Clinician: Massie Kluver Referring Mark Benecke: Treating Timia Casselman/Extender: RO BSO N, MICHA EL Ginny Forth in Treatment: 9  Active Problems Location of Pain Severity and Description of Pain Patient Has Paino No Site Locations [name], [name] ([id number]) [id number] Page 7 of 9 Pain Management and Medication Current Pain Management: Electronic Signature(s) Signed: 08/25/2022 8:23:09 AM By: [name], BSN, RN, CWS, [name] RN, BSN Signed: 08/25/2022 5:15:48 PM By: Massie Kluver Entered By: Massie Kluver on 08/24/2022 13:42:55 -------------------------------------------------------------------------------- Patient/Caregiver Education Details Patient Name: Date of Service: [name], [name] [name] 11/6/2023andnbsp1:30 PM Medical Record Number: [id number] Patient Account Number: [id number] Date of Birth/Gender: Treating RN: [birth year] (76 y.o. [name] Primary Care  Physician: Fulton Reek Other Clinician: Massie Kluver Referring Physician: Treating Physician/Extender: RO BSO Delane Ginger, [place] Ginny Forth in Treatment: 9 Education Assessment Education Provided To: Patient Education Topics Provided Wound/Skin Impairment: Handouts: Other: continue wound care as directed Methods: Explain/Verbal Responses: State content correctly Electronic Signature(s) Signed: 08/25/2022 5:15:48 PM By: Massie Kluver Entered By: Massie Kluver on 08/24/2022 14:08:51 [name] ([id number]) [id number] Page 8 of 9 -------------------------------------------------------------------------------- Wound Assessment Details Patient Name: Date of Service: [name], [name] [name] 08/24/2022 1:30 PM Medical Record Number: [id number] Patient Account Number: [id number] Date of Birth/Sex: Treating RN: Oct 07, 1946 (76 y.o. [name], [name] Primary Care Naseem Adler: Fulton Reek Other Clinician: Massie Kluver Referring Chaunce Winkels: Treating Blaiden Werth/Extender: RO BSO N, MICHA EL Ginny Forth in Treatment: 9 Wound Status Wound Number: 10 Primary Diabetic Wound/Ulcer of the Lower Extremity Etiology: Wound Location: Left Calcaneus Secondary Pressure Ulcer Wounding Event: Gradually Appeared Etiology: Date Acquired: 06/05/2022 Wound Open Weeks Of Treatment: 9 Status: Clustered Wound: No Comorbid Cataracts, Arrhythmia, Coronary Artery Disease, Hypertension, History: Type II Diabetes, Osteoarthritis, Neuropathy Photos Wound Measurements Length: (cm) 1.3 Width: (cm) 2.2 Depth: (cm) 0.1 Area: (cm) 2.246 Volume: (cm) 0.225 % Reduction in Area: -58.8% % Reduction in Volume: 20.5% Epithelialization: Small (1-33%) Wound Description Classification: Grade 1 Wound Margin: Flat and Intact Exudate Amount: Medium Exudate Type: Serous Exudate Color: amber Foul Odor After Cleansing: No Slough/Fibrino Yes Wound Bed Granulation  Amount: Small (1-33%) Exposed Structure Granulation Quality: Pink Fascia Exposed: No Necrotic Amount: Large (67-100%) Fat Layer (Subcutaneous Tissue) Exposed: Yes Necrotic Quality: Adherent Slough Tendon Exposed: No Muscle Exposed: No Joint Exposed: No Bone Exposed: No Treatment Notes Wound #10 (Calcaneus) Wound Laterality: Left Cleanser Peri-Wound Care [name], [name] ([id number]) [id number] Page 9 of 9 Topical Primary Dressing Prisma 4.34 (in) Discharge Instruction: Moisten w/normal saline or sterile water; Cover wound as directed. Do not remove from wound bed. Secondary Dressing ABD Pad 5x9 (in/in) Discharge Instruction: Cover with ABD pad Secured With ACE WRAP - 86M ACE Elastic Bandage With VELCRO Brand Closure, 4 (in) Conform 4'' - Conforming Stretch [name] Bandage 4x75 (in/in) Discharge Instruction: Apply as directed Compression Wrap Compression Stockings Add-Ons Electronic Signature(s) Signed: 08/25/2022 8:23:09 AM By: [name], BSN, RN, CWS, [name] RN, BSN Signed: 08/25/2022 5:15:48 PM By: Massie Kluver Entered By: Massie Kluver on 08/24/2022 13:47:38 -------------------------------------------------------------------------------- [place] Details Patient Name: Date of Service: [name], [name] [name] 08/24/2022 1:30 PM Medical Record Number: [id number] Patient Account Number: [id number] Date of Birth/Sex: Treating RN: 1946/04/06 (76 y.o. [name], [name] Primary Care Zayleigh Stroh: Fulton Reek Other Clinician: Massie Kluver Referring Amarii Bordas: Treating Colston Pyle/Extender: RO BSO N, MICHA EL Ginny Forth in Treatment: 9 Vital Signs Time Taken: 13:39 Temperature (F): 98.3 Height (in): 70 Pulse (bpm): 66 Weight (lbs): 265 Respiratory Rate (breaths/min): 18 Body Mass Index (BMI): 38 Blood Pressure (mmHg): 124/62 Reference Range: 80 - 120 mg / dl Electronic Signature(s) Signed: 08/25/2022  5:15:48 PM By: Massie Kluver Entered By:  Massie Kluver on 08/24/2022 13:42:46

## 2022-08-26 NOTE — Progress Notes (Signed)
STORY, VANVRANKEN ([id number]) [id number] Page 1 of 8 Visit Report for 08/24/2022 HPI Details Patient Name: Date of Service: [name], [name] [name] 08/24/2022 1:30 PM Medical Record Number: [id number] Patient Account Number: [id number] Date of Birth/Sex: Treating RN: 1946-03-08 (76 y.o. [name] Primary Care Provider: Fulton Reek Other Clinician: Massie Kluver Referring Provider: Treating Provider/Extender: RO BSO N, MICHA EL Ginny Forth in Treatment: 9 History of Present Illness HPI Description: 09/24/2020 on evaluation today patient presents today for a heel ulcer that he tells me has been present for about 2 years. He has been seeing podiatry and they have been attempting to manage this including what sounds to be a total contact cast, Unna boot, and just standard dressings otherwise as well. Most recently has been using triple antibiotic ointment. With that being said unfortunately despite everything he really has not had any significant improvement. He tells me that he cannot even really remember exactly how this began but he presumed it may have rubbed on his shoes or something of that nature. 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 He tells me that this does seem to help. Fortunately there is no signs of active infection at this time. His most recent hemoglobin A1c was 8.1 though he knows this was this year he cannot tell me the exact time. His fluid pills currently to help with some of the lower extremity edema although he does obviously have signs of venous stasis/lymphedema. Currently there is no evidence of active infection. No fevers, chills,  nausea, vomiting, or diarrhea. Patient has had fairly recent ABIs which were performed on 07/19/2020 and revealed that he has normal findings in both the ankle and toe locations bilaterally. His ABI on the right was 1.09 on the left was 1.08 with a TBI on the right of 0.88 and on the left of 0.94. Triphasic flow was noted throughout. 10/08/2020 on evaluation today patient appears to be doing pretty well in regard to his left heel currently in fact this is doing a great job and seems to be healing quite nicely. Unfortunately on his right leg he had a pile of wood that actually fell on him injuring his right leg this is somewhat erythematous has me concerned little bit about cellulitis though there is not really a good area to culture at this point. 10/24/2020 upon evaluation today patient appears to be doing well with regard to his heel ulcer. He is showing signs of improvement which is great news. His right leg is completely healed. Overall I feel like he is doing excellent and there is no signs of infection. 11/07/2020 upon evaluation today patient appears to be doing well with regard to his heel ulcer. He tells me that last week when he was unable to come in his wife actually thought that the wound was very close to closing if not closed. Then it began to "reopen again". I really feel like what may have happened as the collagen may have dried over the wound bed and that because that misunderstanding with thinking that the wound was healing. With that being said I did not see it last week I do not know that for certain. Either way I feel  like he is doing great today I see no signs of infection at this point. 11/26/2020 upon inspection today patient appears to be doing decently well in regard to his heel ulcer. He has been tolerating the dressing changes without complication. Fortunately there is no sign of active infection at this time. No fevers, chills, nausea, vomiting, or diarrhea. 12/10/2020 upon  evaluation today patient appears to be doing fairly well in regard to the wound on his heel as well as what appears to be a new wound of the left first metatarsal head plantar aspect. This seems to be an area that was callus that has split as the patient tells me has been trying to walk on his toes more has probably where this came from. With that being said there does not appear to be signs of active infection which is great news. 12/17/2020 upon evaluation today patient actually appears to be making good progress currently. Fortunately there is no evidence of active infection at this time. Overall I feel like he is very close to complete closure. 12/26/2020 upon evaluation today patient appears to be doing excellent in regard to his wounds. In fact I am not certain that these are not even completely healed on initial inspection. Overall I am very pleased with where things stand today. Good news is that they are healed he is actually get ready to go out of town and that will be helpful as well as he will be a full part of the time. Readmission: 02/04/2021 upon evaluation today patient appears to be doing well at this point in regard to his left heel that I previously saw him for. Unfortunately he is having issues with his right lower extremity. He has significant wounds at this point he also has erythema noted there is definite signs of cellulitis which is unfortunate. With that being said I think we do need to address this sooner rather than later. The good news is he did have a nice trip to the beach. He tells me that he had no issues during that time. 4/27; patient on Bactrim. Culture showed methicillin sensitive staph aureus therefore the Bactrim should be effective. 2 small areas on the right leg are healed the area on the mid aspect of the tibia almost 100% covered in a very adherent necrotic debris. We have been using silver alginate 02/20/2021 upon evaluation today patient appears to be doing well  with regard to his wound. He is showing signs of improvement which is great news overall very pleased with where things stand today. No fevers, chills, nausea, vomiting, or diarrhea. 02/27/2021 upon evaluation today patient appears to be doing well with regard to his leg ulcer. He is tolerating the dressing changes and overall appears to be doing quite excellent. I am extremely pleased with where things stand and overall I think patient is making great progress. There is no sign of active infection at this time which is also great news. 03/06/2021 upon evaluation today patient appears to be doing excellent in regard to his wounds. In fact he appears to be completely healed today based on what I am seeing. This is excellent news and overall I am extremely pleased with where he stands. Overall the patient is happy to hear this as well this has been CRISANTO, NIED ([id number]) [id number] Page 2 of 8 quite sometime coming. Readmission: 04/01/2021 patient unfortunately returns for readmission today. He tells me that he has been wearing his compression socks on the right leg daily and  he does not really know what is going on and why his legs are doing what they are doing. We did therefore go ahead and probe deeper into exactly what has been going on with him today. Subsequently the patient tells me that when he gets up and what we would call "first thing in the morning" are really 2 different things". He does not tend to sleep well so he tells me that he will often wake up at 3:00 in the morning. He will then potentially going into the living room to get in his chair where he may read a book for a little while and then potentially fall asleep back in his chair. He then subsequently wake up around 5:00 or so and then get up and in his words "putter around". This often will end with him proceeding at some point around 8 AM or 9 AM to putting on his compression socks. With  that being said this means that anywhere from the 3:00 in the morning till roughly around 9:00 in the morning he has no compression on yet he is sitting in his recliner, walking around and up and about, and this is at least 5 to 6 hours of noncompressed time. That may be our issue here but I did not realize until we discussed this further today. Fortunately there does not appear to be any signs of active infection at this time which is great news. With that being said he has multiple wounds of the bilateral lower extremities. 04/10/2021 upon evaluation today patient appears to be doing well with regard to his legs for the most part. He does look like he had some injury where his wrap slid down nonetheless he did some "doctoring on them". I do feel like most of the areas honestly have cleared back up which is great news. There does not appear to be any signs of active infection at this time which is also great news. No fevers, chills, nausea, vomiting, or diarrhea. 04/18/2021 upon evaluation today patient appears to be doing well with regard to his wounds in fact he appears to be completely healed which is great news. Fortunately there does not appear to be any signs of active infection which is great news. No fevers, chills, nausea, vomiting, or diarrhea. READMISSION 07/28/2021 This is a now 75 year old man we have had in this clinic for quite a bit of this year. 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 He wears compression stockings. 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 He comes in an old running shoes although he says he wears better shoes at home. His history is that he noticed a blister in the right posterior heel. This open. He has been applying Neosporin to it currently the wound measures 2 x 1.4 cm. 100% slough covered. Not really offloading this in any rigorous way. Past  medical history is essentially unchanged he has paroxysmal atrial fibrillation on Coumadin type 2 diabetes with a recent hemoglobin A1c of 7 on metformin. ABI in our clinic was 0.98 on the right 08/05/2021 upon evaluation today patient appears to be doing well with regard to his heel ulcer. Fortunately there does not appear to be any signs of active infection at this time. Overall I been very pleased with where things seem to be currently as far as the wound healing is concerned. Again this is first a lot of seeing him Dr. Dellia Nims  readmitted him last week. Nonetheless I think we are definitely making some progress here. 08/11/2021 upon evaluation today patient's wound on the heel actually showing signs of excellent improvement. I am actually very pleased with where things stand currently. No fevers, chills, nausea, vomiting, or diarrhea. There does not appear to be any need for sharp debridement today either which is also great news. 08/25/2021 upon evaluation today patient appears to be doing well with regard to his heel ulcer. This is actually looking significantly improved compared to last time I saw him. Fortunately there does not appear to be any evidence of active infection at this time. No fevers, chills, nausea, vomiting, or diarrhea. 09/01/2021 upon evaluation today patient appears to be doing decently well in regard to his wounds. Fortunately there does not appear to be any signs of active infection at this time which is great news and overall very pleased in that regard. I do not see any evidence of active infection systemically which is great news as well. No fevers, chills, nausea, vomiting, or diarrhea. I think that the heel is making excellent progress. 09/15/2021 upon evaluation today patient's heel unfortunately is significantly worse compared to what it was previous. I do believe that at this time he would benefit from switching back to something a little bit more offloading from the  cushion shoe he has right now this is more of a heel protector what I really need is a Prevalon offloading boot which I think is good to do a lot better for him than just the small heel protector. 09/23/2021 upon evaluation today patient appears to be doing a little better in regards to last week's visit. Overall I think that he is making good progress which is great news and there does not appear to be any evidence of active infection at this time. No fevers, chills, nausea, vomiting, or diarrhea. 09/30/2021 upon evaluation patient's heel is actually showing signs of good improvement which is great news. Fortunately there does not appear to be any evidence of active infection locally nor systemically at this point. No fevers, chills, nausea, vomiting, or diarrhea. 10/07/2021 upon evaluation today patient appears to be doing well currently in regard to his heel ulcer. He has been tolerating the dressing changes without complication. Fortunately I do not see any signs of active infection at this time which is great news. No fevers, chills, nausea, vomiting, or diarrhea. 12/27; wound is measuring smaller. We have been using silver alginate 10/28/2021 upon evaluation today patient appears to be doing well currently in regard to his heel ulcer. I am actually very pleased with where things stand and I think he is making excellent progress. Fortunately I do not see any signs of active infection locally nor systemically at this point which is great news. Nonetheless I do believe that the patient would benefit from a new offloading shoe and has been using it Velcro is no longer functioning properly and he tells me that he almost tripped and fell because of that today. Obviously I do not want him following that would be very bad. 11/11/2021 upon evaluation today patient appears to be doing excellent in regard to his wound. In fact this appears to be completely healed based on what I see currently. I do not see any  signs of anything open or draining and I did double check just by clearing away some of the callus around the edges of the wound to ensure that there was nothing still open or hiding underneath. Once  this was cleared away it appeared that the patient was doing significantly better at this time which is great news and there was nothing actually open. Readmission: 06-19-2022 upon evaluation today patient appears to be doing well currently in regard to his heel ulcer all things considered. He unfortunately is having a bit of breakdown in general as far as the heel is concerned not nearly as bad as what we noted last time he was here in the clinic. The good news is I do think that this should hopefully heal much more rapidly than what we noted last time. His past medical history has not changed he is on Coumadin. 07-03-2022 upon evaluation today patient appears to be doing better in regard to his heel. We will start to see some improvement here which is good news. Fortunately I do not see any evidence of infection locally or systemically at this time which is great news. No fevers, chills, nausea, vomiting, or diarrhea. 07-10-2022 upon evaluation today patient appears to be doing well currently in regard to his wound from the callus standpoint around the edges. Unfortunately from an actual wound bed standpoint he has some deep tissue injury noted at this point. Again I am not sure exactly what is going on that is causing this pressure to the region but he is definitely gotten pressure that is occurring and causing this to not heal as effectively as what we would like to see. I discussed with him today that he may need to at night when he sleeping use a pillow up underneath his calf in order to prevent the heel from touching the bed at all. I also think that it may be beneficial for him to monitor throughout his day and make sure there is no other time when he is getting the pressure to the heel. 07-23-2022 upon  evaluation today patient appears to be doing poorly currently in regard to his heel ulcer. He has been tolerating the dressing changes without KENNON, ENCINAS ([id number]) [id number] Page 3 of 8 complication. Unfortunately I feel like he may be infected based on what I am seeing. 07-28-2022 I did review patient's culture results which showed positive for Proteus as well as Staphylococcus both of which would be treated with the Bactrim DS that I gave him at the last visit. This is good news and hopefully means that we should be able to apply the cast today as long as the wound itself does not appear to be doing poorly. Patient's wound bed actually showed signs of doing quite well and I am very pleased with where things stand and I do not see any signs of worsening overall which is great news. 08-04-2022 upon evaluation today patient appears to be doing well currently in regard to his heel ulcer. I am actually very pleased with where things stand and I do think this is looking significantly better. With regard to his right shin that is also showing signs of improvement which is great news. 08-10-2022 upon evaluation today patient's wound on the heel actually appears to be doing significantly better. In regard to the right anterior shin this is showing signs of healing it might even be completely healed but I still get a monitor I cannot get anything to fill away but also could not see where there was anything actually draining at this point. I am tending to think healed but I want a monitor 1 more week before I call it for sure. 08-17-2022 upon evaluation today patient appears  to be doing well currently in regard to his heel. Fortunately he is tolerating the dressing changes without complication. I do not see any signs of infection and overall I think he is making good progress here. We did get approval for the Apligraf but I think he had a $295 co-pay that would have to be  paid per application. With that being said as good as he is doing right now I do not think that is necessary but is still an option depending on how things progress if we need to speed this up quite a bit. 08/24/2022; this patient has a wound on his left heel in the setting of type 2 diabetes. We have been using Prisma. He has a heel offloading boot Electronic Signature(s) Signed: 08/24/2022 4:14:14 PM By: Linton Ham MD Entered By: Linton Ham on 08/24/2022 14:16:33 -------------------------------------------------------------------------------- Physical Exam Details Patient Name: Date of Service: [name], [name] [name] 08/24/2022 1:30 PM Medical Record Number: [id number] Patient Account Number: [id number] Date of Birth/Sex: Treating RN: 10/15/1946 (76 y.o. [name] Primary Care Provider: Fulton Reek Other Clinician: Massie Kluver Referring Provider: Treating Provider/Extender: RO BSO N, Grannis EL Ginny Forth in Treatment: 9 Constitutional Sitting or standing Blood Pressure is within target range for patient.. Pulse regular and within target range for patient.Marland Kitchen Respirations regular, non-labored and within target range.. Temperature is normal and within the target range for the patient.Marland Kitchen appears in no distress. Notes Wound exam; left heel medial aspect. Under illumination the surface does not look too bad no debridement was done today. No evidence of surrounding infection Electronic Signature(s) Signed: 08/24/2022 4:14:14 PM By: Linton Ham MD Entered By: Linton Ham on 08/24/2022 14:17:19 -------------------------------------------------------------------------------- Physician Orders Details Patient Name: Date of Service: [name], [name] [name] 08/24/2022 1:30 PM Medical Record Number: [id number] Patient Account Number: [id number] PHOENYX, PAULSEN ([id number]) [id number] Page 4 of 8 Date of Birth/Sex: Treating RN: [birth year]  (76 y.o. [name] Primary Care Provider: Fulton Reek Other Clinician: Massie Kluver Referring Provider: Treating Provider/Extender: RO BSO N, MICHA EL Ginny Forth in Treatment: 9 Verbal / Phone Orders: No Diagnosis Coding Follow-up Appointments Wound #10 Left Calcaneus Return Appointment in 1 week. Bathing/ Shower/ Hygiene May shower; gently cleanse wound with antibacterial soap, rinse and pat dry prior to dressing wounds Non-Wound Condition dditional non-wound orders/instructions: - Apply 40% Urea cream to dry cracked skin on feet. A Off-Loading Open toe surgical shoe with peg assist. Wound Treatment Wound #10 - Calcaneus Wound Laterality: Left Prim Dressing: Prisma 4.34 (in) (Generic) 3 x Per Week/30 Days ary Discharge Instructions: Moisten w/normal saline or sterile water; Cover wound as directed. Do not remove from wound bed. Secondary Dressing: ABD Pad 5x9 (in/in) (Generic) 3 x Per Week/30 Days Discharge Instructions: Cover with ABD pad Secured With: ACE WRAP - 81M ACE Elastic Bandage With VELCRO Brand Closure, 4 (in) 3 x Per Week/30 Days Secured With: Conform 4'' - Conforming Stretch [name] Bandage 4x75 (in/in) 3 x Per Week/30 Days Discharge Instructions: Apply as directed Electronic Signature(s) Signed: 08/24/2022 4:14:14 PM By: Linton Ham MD Signed: 08/25/2022 5:15:48 PM By: Massie Kluver Entered By: Massie Kluver on 08/24/2022 14:07:25 -------------------------------------------------------------------------------- Problem List Details Patient Name: Date of Service: [name], [name] [name] 08/24/2022 1:30 PM Medical Record Number: [id number] Patient Account Number: [id number] Date of Birth/Sex: Treating RN: 10-30-1945 (76 y.o. [name] Primary Care Provider: Fulton Reek Other Clinician: Massie Kluver Referring Provider: Treating Provider/Extender: RO BSO N, MICHA EL  Bo Merino Weeks in Treatment: 9 Active  Problems ICD-10 Encounter Code Description Active Date MDM Diagnosis E11.621 Type 2 diabetes mellitus with foot ulcer 06/19/2022 No Yes 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sed 06/19/2022 No Yes SHOGO, LARKEY ([id number]) [id number] Page 5 of 8 E11.42 Type 2 diabetes mellitus with diabetic polyneuropathy 06/19/2022 No Yes Inactive Problems Resolved Problems Electronic Signature(s) Signed: 08/24/2022 4:14:14 PM By: Linton Ham MD Entered By: Linton Ham on 08/24/2022 14:15:10 -------------------------------------------------------------------------------- Progress Note Details Patient Name: Date of Service: [name], [name] [name] 08/24/2022 1:30 PM Medical Record Number: [id number] Patient Account Number: [id number] Date of Birth/Sex: Treating RN: 1945/10/24 (76 y.o. [name] Primary Care Provider: Fulton Reek Other Clinician: Massie Kluver Referring Provider: Treating Provider/Extender: RO BSO N, Fouke EL Ginny Forth in Treatment: 9 Subjective History of Present Illness (HPI) 09/24/2020 on evaluation today patient presents today for a heel ulcer that he tells me has been present for about 2 years. He has been seeing podiatry and they have been attempting to manage this including what sounds to be a total contact cast, Unna boot, and just standard dressings otherwise as well. Most recently has been using triple antibiotic ointment. With that being said unfortunately despite everything he really has not had any significant improvement. He tells me that he cannot even really remember exactly how this began but he presumed it may have rubbed on his shoes or something of that nature. 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 He tells me that this does seem to help. Fortunately there is no signs of active infection at this time. His most recent hemoglobin A1c was 8.1 though he knows this was this year he cannot tell me the exact time. His fluid pills currently to help with some of the lower extremity edema although he does obviously have signs of venous stasis/lymphedema. Currently there is no evidence of active infection. No fevers, chills, nausea, vomiting, or diarrhea. Patient has had fairly recent ABIs which were performed on 07/19/2020 and revealed that he has normal findings in both the ankle and toe locations bilaterally. His ABI on the right was 1.09 on the left was 1.08 with a TBI on the right of 0.88 and on the left of 0.94. Triphasic flow was noted throughout. 10/08/2020 on evaluation today patient appears to be doing pretty well in regard to his left heel currently in fact this is doing a great job and seems to be healing quite nicely. Unfortunately on his right leg he had a pile of wood that actually fell on him injuring his right leg this is somewhat erythematous has me concerned little bit about cellulitis though there is not really a good area to culture at this point. 10/24/2020 upon evaluation today patient appears to be doing well with regard to his heel ulcer. He is showing signs of improvement which is great news. His right leg is completely healed. Overall I feel like he is doing excellent and there is no signs of infection. 11/07/2020 upon evaluation today patient appears to be doing well with regard to his heel ulcer. He tells me that last week when he was unable  to come in his wife actually thought that the wound was very close to closing if not closed. Then it began to "reopen again". I really feel like what may have happened as the collagen may have dried over the wound bed and that because  that misunderstanding with thinking that the wound was healing. With that being said I did not see it last week I do not know that for certain. Either way I feel like he is doing great today I see no signs of infection at this point. 11/26/2020 upon inspection today patient appears to be doing decently well in regard to his heel ulcer. He has been tolerating the dressing changes without complication. Fortunately there is no sign of active infection at this time. No fevers, chills, nausea, vomiting, or diarrhea. 12/10/2020 upon evaluation today patient appears to be doing fairly well in regard to the wound on his heel as well as what appears to be a new wound of the left first metatarsal head plantar aspect. This seems to be an area that was callus that has split as the patient tells me has been trying to walk on his toes more has probably where this came from. With that being said there does not appear to be signs of active infection which is great news. 12/17/2020 upon evaluation today patient actually appears to be making good progress currently. Fortunately there is no evidence of active infection at this time. Overall I feel like he is very close to complete closure. 12/26/2020 upon evaluation today patient appears to be doing excellent in regard to his wounds. In fact I am not certain that these are not even completely healed on initial inspection. Overall I am very pleased with where things stand today. Good news is that they are healed he is actually get ready to go out of town and that will be helpful as well as he will be a full part of the time. Readmission: 02/04/2021 upon evaluation today patient appears to be doing well at this point in regard to his left heel that I previously saw him for. Unfortunately he is having KARNELL, VANDERLOOP ([id number]) [id number] Page 6 of 8 issues with his right lower extremity. He has significant wounds at this point he also has erythema  noted there is definite signs of cellulitis which is unfortunate. With that being said I think we do need to address this sooner rather than later. The good news is he did have a nice trip to the beach. He tells me that he had no issues during that time. 4/27; patient on Bactrim. Culture showed methicillin sensitive staph aureus therefore the Bactrim should be effective. 2 small areas on the right leg are healed the area on the mid aspect of the tibia almost 100% covered in a very adherent necrotic debris. We have been using silver alginate 02/20/2021 upon evaluation today patient appears to be doing well with regard to his wound. He is showing signs of improvement which is great news overall very pleased with where things stand today. No fevers, chills, nausea, vomiting, or diarrhea. 02/27/2021 upon evaluation today patient appears to be doing well with regard to his leg ulcer. He is tolerating the dressing changes and overall appears to be doing quite excellent. I am extremely pleased with where things stand and overall I think patient is making great progress. There is no sign of active infection at this time which is also great news. 03/06/2021 upon evaluation today patient appears to be doing  excellent in regard to his wounds. In fact he appears to be completely healed today based on what I am seeing. This is excellent news and overall I am extremely pleased with where he stands. Overall the patient is happy to hear this as well this has been quite sometime coming. Readmission: 04/01/2021 patient unfortunately returns for readmission today. He tells me that he has been wearing his compression socks on the right leg daily and he does not really know what is going on and why his legs are doing what they are doing. We did therefore go ahead and probe deeper into exactly what has been going on with him today. Subsequently the patient tells me that when he gets up and what we would call "first thing in  the morning" are really 2 different things". He does not tend to sleep well so he tells me that he will often wake up at 3:00 in the morning. He will then potentially going into the living room to get in his chair where he may read a book for a little while and then potentially fall asleep back in his chair. He then subsequently wake up around 5:00 or so and then get up and in his words "putter around". This often will end with him proceeding at some point around 8 AM or 9 AM to putting on his compression socks. With that being said this means that anywhere from the 3:00 in the morning till roughly around 9:00 in the morning he has no compression on yet he is sitting in his recliner, walking around and up and about, and this is at least 5 to 6 hours of noncompressed time. That may be our issue here but I did not realize until we discussed this further today. Fortunately there does not appear to be any signs of active infection at this time which is great news. With that being said he has multiple wounds of the bilateral lower extremities. 04/10/2021 upon evaluation today patient appears to be doing well with regard to his legs for the most part. He does look like he had some injury where his wrap slid down nonetheless he did some "doctoring on them". I do feel like most of the areas honestly have cleared back up which is great news. There does not appear to be any signs of active infection at this time which is also great news. No fevers, chills, nausea, vomiting, or diarrhea. 04/18/2021 upon evaluation today patient appears to be doing well with regard to his wounds in fact he appears to be completely healed which is great news. Fortunately there does not appear to be any signs of active infection which is great news. No fevers, chills, nausea, vomiting, or diarrhea. READMISSION 07/28/2021 This is a now 76 year old man we have had in this clinic for quite a bit of this year. He has been in here with  bilateral lower extremity leg wounds probably secondary to chronic venous insufficiency. He wears compression stockings. 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 He comes in an old running shoes although he says he wears better shoes at home. His history is that he noticed a blister in the right posterior heel. This open. He has been applying Neosporin to it currently the wound measures 2 x 1.4 cm. 100% slough covered. Not really offloading this in any rigorous way. Past medical history is essentially unchanged he has paroxysmal atrial fibrillation on Coumadin type 2 diabetes with a recent hemoglobin  A1c of 7 on metformin. ABI in our clinic was 0.98 on the right 08/05/2021 upon evaluation today patient appears to be doing well with regard to his heel ulcer. Fortunately there does not appear to be any signs of active infection at this time. Overall I been very pleased with where things seem to be currently as far as the wound healing is concerned. Again this is first a lot of seeing him Dr. Dellia Nims readmitted him last week. Nonetheless I think we are definitely making some progress here. 08/11/2021 upon evaluation today patient's wound on the heel actually showing signs of excellent improvement. I am actually very pleased with where things stand currently. No fevers, chills, nausea, vomiting, or diarrhea. There does not appear to be any need for sharp debridement today either which is also great news. 08/25/2021 upon evaluation today patient appears to be doing well with regard to his heel ulcer. This is actually looking significantly improved compared to last time I saw him. Fortunately there does not appear to be any evidence of active infection at this time. No fevers, chills, nausea, vomiting, or diarrhea. 09/01/2021 upon evaluation today patient appears to be doing decently well in regard to his wounds. Fortunately there does not appear to be any signs  of active infection at this time which is great news and overall very pleased in that regard. I do not see any evidence of active infection systemically which is great news as well. No fevers, chills, nausea, vomiting, or diarrhea. I think that the heel is making excellent progress. 09/15/2021 upon evaluation today patient's heel unfortunately is significantly worse compared to what it was previous. I do believe that at this time he would benefit from switching back to something a little bit more offloading from the cushion shoe he has right now this is more of a heel protector what I really need is a Prevalon offloading boot which I think is good to do a lot better for him than just the small heel protector. 09/23/2021 upon evaluation today patient appears to be doing a little better in regards to last week's visit. Overall I think that he is making good progress which is great news and there does not appear to be any evidence of active infection at this time. No fevers, chills, nausea, vomiting, or diarrhea. 09/30/2021 upon evaluation patient's heel is actually showing signs of good improvement which is great news. Fortunately there does not appear to be any evidence of active infection locally nor systemically at this point. No fevers, chills, nausea, vomiting, or diarrhea. 10/07/2021 upon evaluation today patient appears to be doing well currently in regard to his heel ulcer. He has been tolerating the dressing changes without complication. Fortunately I do not see any signs of active infection at this time which is great news. No fevers, chills, nausea, vomiting, or diarrhea. 12/27; wound is measuring smaller. We have been using silver alginate 10/28/2021 upon evaluation today patient appears to be doing well currently in regard to his heel ulcer. I am actually very pleased with where things stand and I think he is making excellent progress. Fortunately I do not see any signs of active infection  locally nor systemically at this point which is great news. Nonetheless I do believe that the patient would benefit from a new offloading shoe and has been using it Velcro is no longer functioning properly and he tells me that he almost tripped and fell because of that today. Obviously I do not want him following  that would be very bad. 11/11/2021 upon evaluation today patient appears to be doing excellent in regard to his wound. In fact this appears to be completely healed based on what I see currently. I do not see any signs of anything open or draining and I did double check just by clearing away some of the callus around the edges of the wound to ensure that there was nothing still open or hiding underneath. Once this was cleared away it appeared that the patient was doing significantly better at this time which is great news and there was nothing actually open. AQIL, GOETTING ([id number]) [id number] Page 7 of 8 Readmission: 06-19-2022 upon evaluation today patient appears to be doing well currently in regard to his heel ulcer all things considered. He unfortunately is having a bit of breakdown in general as far as the heel is concerned not nearly as bad as what we noted last time he was here in the clinic. The good news is I do think that this should hopefully heal much more rapidly than what we noted last time. His past medical history has not changed he is on Coumadin. 07-03-2022 upon evaluation today patient appears to be doing better in regard to his heel. We will start to see some improvement here which is good news. Fortunately I do not see any evidence of infection locally or systemically at this time which is great news. No fevers, chills, nausea, vomiting, or diarrhea. 07-10-2022 upon evaluation today patient appears to be doing well currently in regard to his wound from the callus standpoint around the edges. Unfortunately from an actual wound bed standpoint he  has some deep tissue injury noted at this point. Again I am not sure exactly what is going on that is causing this pressure to the region but he is definitely gotten pressure that is occurring and causing this to not heal as effectively as what we would like to see. I discussed with him today that he may need to at night when he sleeping use a pillow up underneath his calf in order to prevent the heel from touching the bed at all. I also think that it may be beneficial for him to monitor throughout his day and make sure there is no other time when he is getting the pressure to the heel. 07-23-2022 upon evaluation today patient appears to be doing poorly currently in regard to his heel ulcer. He has been tolerating the dressing changes without complication. Unfortunately I feel like he may be infected based on what I am seeing. 07-28-2022 I did review patient's culture results which showed positive for Proteus as well as Staphylococcus both of which would be treated with the Bactrim DS that I gave him at the last visit. This is good news and hopefully means that we should be able to apply the cast today as long as the wound itself does not appear to be doing poorly. Patient's wound bed actually showed signs of doing quite well and I am very pleased with where things stand and I do not see any signs of worsening overall which is great news. 08-04-2022 upon evaluation today patient appears to be doing well currently in regard to his heel ulcer. I am actually very pleased with where things stand and I do think this is looking significantly better. With regard to his right shin that is also showing signs of improvement which is great news. 08-10-2022 upon evaluation today patient's wound on the heel actually appears to  be doing significantly better. In regard to the right anterior shin this is showing signs of healing it might even be completely healed but I still get a monitor I cannot get anything to fill  away but also could not see where there was anything actually draining at this point. I am tending to think healed but I want a monitor 1 more week before I call it for sure. 08-17-2022 upon evaluation today patient appears to be doing well currently in regard to his heel. Fortunately he is tolerating the dressing changes without complication. I do not see any signs of infection and overall I think he is making good progress here. We did get approval for the Apligraf but I think he had a $295 co-pay that would have to be paid per application. With that being said as good as he is doing right now I do not think that is necessary but is still an option depending on how things progress if we need to speed this up quite a bit. 08/24/2022; this patient has a wound on his left heel in the setting of type 2 diabetes. We have been using Prisma. He has a heel offloading boot Objective Constitutional Sitting or standing Blood Pressure is within target range for patient.. Pulse regular and within target range for patient.Marland Kitchen Respirations regular, non-labored and within target range.. Temperature is normal and within the target range for the patient.Marland Kitchen appears in no distress. Vitals Time Taken: 1:39 PM, Height: 70 in, Weight: 265 lbs, BMI: 38, Temperature: 98.3 F, Pulse: 66 bpm, Respiratory Rate: 18 breaths/min, Blood Pressure: 124/62 mmHg. General Notes: Wound exam; left heel medial aspect. Under illumination the surface does not look too bad no debridement was done today. No evidence of surrounding infection Integumentary (Hair, Skin) Wound #10 status is Open. Original cause of wound was Gradually Appeared. The date acquired was: 06/05/2022. The wound has been in treatment 9 weeks. The wound is located on the Left Calcaneus. The wound measures 1.3cm length x 2.2cm width x 0.1cm depth; 2.246cm^2 area and 0.225cm^3 volume. There is Fat Layer (Subcutaneous Tissue) exposed. There is a medium amount of serous  drainage noted. The wound margin is flat and intact. There is small (1-33%) pink granulation within the wound bed. There is a large (67-100%) amount of necrotic tissue within the wound bed including Adherent Slough. 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 Bathing/ Shower/ Hygiene: CAYLEN, YARDLEY ([id number]) [id number] Page 8 of 8 May shower; gently cleanse wound with antibacterial soap, rinse and pat dry prior to dressing wounds Non-Wound Condition: Additional non-wound orders/instructions: - Apply 40% Urea cream to dry cracked skin on feet. Off-Loading: Open toe surgical shoe with peg assist. WOUND #10: - Calcaneus Wound Laterality: Left Prim Dressing: Prisma 4.34 (in) (Generic) 3 x Per Week/30 Days ary Discharge Instructions: Moisten w/normal saline or sterile water; Cover wound as directed. Do not remove from wound bed. Secondary Dressing: ABD Pad 5x9 (in/in) (Generic) 3 x Per Week/30 Days Discharge Instructions: Cover with ABD pad Secured With: ACE WRAP - 47M ACE Elastic Bandage With VELCRO Brand Closure, 4 (in) 3 x Per Week/30 Days Secured With: Conform 4'' - Conforming Stretch [name] Bandage 4x75 (in/in) 3 x Per Week/30 Days Discharge Instructions: Apply as directed 1. #1 no change of the  primary or secondary dressings. 2. He has a heel offloading shoe we talked about pressure relief. 3. I did not debride this but if this stalls it certainly will need consideration Electronic Signature(s) Signed: 08/24/2022 4:14:14 PM By: Linton Ham MD Entered By: Linton Ham on 08/24/2022 14:18:02 -------------------------------------------------------------------------------- SuperBill Details Patient  Name: Date of Service: [name], [name] [name] 08/24/2022 Medical Record Number: [id number] Patient Account Number: [id number] Date of Birth/Sex: Treating RN: 1946-09-20 (76 y.o. [name] Primary Care Provider: Fulton Reek Other Clinician: Massie Kluver Referring Provider: Treating Provider/Extender: Eldridge Dace, MICHA EL Ginny Forth in Treatment: 9 Diagnosis Coding ICD-10 Codes Code Description E11.621 Type 2 diabetes mellitus with foot ulcer 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sed E11.42 Type 2 diabetes mellitus with diabetic polyneuropathy Facility Procedures : CPT4 Code: [id number] Description: 99213 - WOUND CARE VISIT-LEV 3 EST PT Modifier: Quantity: 1 Physician Procedures : CPT4 Code Description Modifier [id number] - WC PHYS LEVEL 3 - EST PT ICD-10 Diagnosis Description E11.621 Type 2 diabetes mellitus with foot ulcer 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sed Quantity: 1 Electronic Signature(s) Signed: 08/24/2022 4:14:14 PM By: Linton Ham MD Entered By: Linton Ham on 08/24/2022 14:18:19

## 2022-08-31 ENCOUNTER — Encounter: Payer: Medicare HMO | Admitting: Physician Assistant

## 2022-08-31 DIAGNOSIS — E11621 Type 2 diabetes mellitus with foot ulcer: Secondary | ICD-10-CM | POA: Diagnosis not present

## 2022-08-31 LAB — GLUCOSE, CAPILLARY
Glucose-Capillary: 57 mg/dL — ABNORMAL LOW (ref 70–99)
Glucose-Capillary: 97 mg/dL (ref 70–99)

## 2022-08-31 NOTE — Progress Notes (Signed)
DAVONNE, JARNIGAN ([id number]) [id number] Page 1 of 6 Visit Report for 08/31/2022 Chief Complaint Document Details Patient Name: Date of Service: [name], [name] [name] 08/31/2022 1:30 PM Medical Record Number: [id number] Patient Account Number: [id number] Date of Birth/Sex: Treating RN: 03/19/1946 (76 y.o. [name] Primary Care Provider: Fulton Reek Other Clinician: Massie Kluver Referring Provider: Treating Provider/Extender: Maurilio Lovely Weeks in Treatment: 10 Information Obtained from: Patient Chief Complaint Left heel ulcer Electronic Signature(s) Signed: 08/31/2022 1:25:16 PM By: Worthy Keeler PA-C Entered By: Worthy Keeler on 08/31/2022 13:25:16 -------------------------------------------------------------------------------- Debridement Details Patient Name: Date of Service: [name], [name] [name] 08/31/2022 1:30 PM Medical Record Number: [id number] Patient Account Number: [id number] Date of Birth/Sex: Treating RN: 1946-09-24 (76 y.o. [name] Primary Care Provider: Fulton Reek Other Clinician: Massie Kluver Referring Provider: Treating Provider/Extender: Maurilio Lovely Weeks in Treatment: 10 Debridement Performed for Assessment: Wound #10 Left Calcaneus Performed By: Physician Tommie Sams., PA-C Debridement Type: Debridement Severity of Tissue Pre Debridement: Limited to breakdown of skin Level of Consciousness (Pre-procedure): Awake and Alert Pre-procedure Verification/Time Out Yes - 02:26 Taken: Start Time: 02:26 T Area Debrided (L x W): otal 2 (cm) x 3 (cm) = 6 (cm) Tissue and other material debrided: Viable, Non-Viable, Callus, Slough, Subcutaneous, Slough Level: Skin/Subcutaneous Tissue Debridement Description: Excisional Instrument: Curette Bleeding: Minimum Hemostasis Achieved: Pressure Response to Treatment: Procedure was tolerated well Level of Consciousness (Post- Awake and  Alert procedure): FREDIE, MAJANO ([id number]) [id number] Page 2 of 6 Post Debridement Measurements of Total Wound Length: (cm) 1.4 Width: (cm) 2.3 Depth: (cm) 0.2 Volume: (cm) 0.506 Character of Wound/Ulcer Post Debridement: Stable Severity of Tissue Post Debridement: Fat layer exposed Post Procedure Diagnosis Same as Pre-procedure Electronic Signature(s) Unsigned Entered ByMassie Kluver on 08/31/2022 14:32:02 -------------------------------------------------------------------------------- HPI Details Patient Name: Date of ServicePrimitivo [name], [name] [name] 08/31/2022 1:30 PM Medical Record Number: [id number] Patient Account Number: [id number] Date of Birth/Sex: Treating RN: 10/23/1945 (76 y.o. [name] Primary Care Provider: Fulton Reek Other Clinician: Massie Kluver Referring Provider: Treating Provider/Extender: Maurilio Lovely Weeks in Treatment: 10 History of Present Illness HPI Description: 09/24/2020 on evaluation today patient presents today for a heel ulcer that he tells me has been present for about 2 years. He has been seeing podiatry and they have been attempting to manage this including what sounds to be a total contact cast, Unna boot, and just standard dressings otherwise as well. Most recently has been using triple antibiotic ointment. With that being said unfortunately despite everything he really has not had any significant improvement. He tells me that he cannot even really remember exactly how this began but he presumed it may have rubbed on his shoes or something of that nature. 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 He tells me  that this does seem to help. Fortunately there is no signs of active infection at this time. His most recent hemoglobin A1c was 8.1 though he knows this was this year he cannot tell me the exact time. His fluid pills currently to help with some of the lower extremity edema although he does obviously have signs of venous stasis/lymphedema. Currently there is  no evidence of active infection. No fevers, chills, nausea, vomiting, or diarrhea. Patient has had fairly recent ABIs which were performed on 07/19/2020 and revealed that he has normal findings in both the ankle and toe locations bilaterally. His ABI on the right was 1.09 on the left was 1.08 with a TBI on the right of 0.88 and on the left of 0.94. Triphasic flow was noted throughout. 10/08/2020 on evaluation today patient appears to be doing pretty well in regard to his left heel currently in fact this is doing a great job and seems to be healing quite nicely. Unfortunately on his right leg he had a pile of wood that actually fell on him injuring his right leg this is somewhat erythematous has me concerned little bit about cellulitis though there is not really a good area to culture at this point. 10/24/2020 upon evaluation today patient appears to be doing well with regard to his heel ulcer. He is showing signs of improvement which is great news. His right leg is completely healed. Overall I feel like he is doing excellent and there is no signs of infection. 11/07/2020 upon evaluation today patient appears to be doing well with regard to his heel ulcer. He tells me that last week when he was unable to come in his wife actually thought that the wound was very close to closing if not closed. Then it began to "reopen again". I really feel like what may have happened as the collagen may have dried over the wound bed and that because that misunderstanding with thinking that the wound was healing. With that being said I did not see it last week I do not  know that for certain. Either way I feel like he is doing great today I see no signs of infection at this point. 11/26/2020 upon inspection today patient appears to be doing decently well in regard to his heel ulcer. He has been tolerating the dressing changes without complication. Fortunately there is no sign of active infection at this time. No fevers, chills, nausea, vomiting, or diarrhea. 12/10/2020 upon evaluation today patient appears to be doing fairly well in regard to the wound on his heel as well as what appears to be a new wound of the left first metatarsal head plantar aspect. This seems to be an area that was callus that has split as the patient tells me has been trying to walk on his toes more has probably where this came from. With that being said there does not appear to be signs of active infection which is great news. 12/17/2020 upon evaluation today patient actually appears to be making good progress currently. Fortunately there is no evidence of active infection at this time. Overall I feel like he is very close to complete closure. 12/26/2020 upon evaluation today patient appears to be doing excellent in regard to his wounds. In fact I am not certain that these are not even completely healed on initial inspection. Overall I am very pleased with where things stand today. Good news is that they are healed he is actually get ready to go out of town and that will be helpful as well as he will be a full part of the time. ROWEN, WILMER ([id number]) [id number] Page 3 of 6 Readmission: 02/04/2021 upon evaluation today patient appears to be doing well at this point in regard to his left heel that I previously saw him for. Unfortunately he is having issues with his right lower extremity. He has significant  wounds at this point he also has erythema noted there is definite signs of cellulitis which is unfortunate. With that being said I think we do need to address  this sooner rather than later. The good news is he did have a nice trip to the beach. He tells me that he had no issues during that time. 4/27; patient on Bactrim. Culture showed methicillin sensitive staph aureus therefore the Bactrim should be effective. 2 small areas on the right leg are healed the area on the mid aspect of the tibia almost 100% covered in a very adherent necrotic debris. We have been using silver alginate 02/20/2021 upon evaluation today patient appears to be doing well with regard to his wound. He is showing signs of improvement which is great news overall very pleased with where things stand today. No fevers, chills, nausea, vomiting, or diarrhea. 02/27/2021 upon evaluation today patient appears to be doing well with regard to his leg ulcer. He is tolerating the dressing changes and overall appears to be doing quite excellent. I am extremely pleased with where things stand and overall I think patient is making great progress. There is no sign of active infection at this time which is also great news. 03/06/2021 upon evaluation today patient appears to be doing excellent in regard to his wounds. In fact he appears to be completely healed today based on what I am seeing. This is excellent news and overall I am extremely pleased with where he stands. Overall the patient is happy to hear this as well this has been quite sometime coming. Readmission: 04/01/2021 patient unfortunately returns for readmission today. He tells me that he has been wearing his compression socks on the right leg daily and he does not really know what is going on and why his legs are doing what they are doing. We did therefore go ahead and probe deeper into exactly what has been going on with him today. Subsequently the patient tells me that when he gets up and what we would call "first thing in the morning" are really 2 different things". He does not tend to sleep well so he tells me that he will often wake up  at 3:00 in the morning. He will then potentially going into the living room to get in his chair where he may read a book for a little while and then potentially fall asleep back in his chair. He then subsequently wake up around 5:00 or so and then get up and in his words "putter around". This often will end with him proceeding at some point around 8 AM or 9 AM to putting on his compression socks. With that being said this means that anywhere from the 3:00 in the morning till roughly around 9:00 in the morning he has no compression on yet he is sitting in his recliner, walking around and up and about, and this is at least 5 to 6 hours of noncompressed time. That may be our issue here but I did not realize until we discussed this further today. Fortunately there does not appear to be any signs of active infection at this time which is great news. With that being said he has multiple wounds of the bilateral lower extremities. 04/10/2021 upon evaluation today patient appears to be doing well with regard to his legs for the most part. He does look like he had some injury where his wrap slid down nonetheless he did some "doctoring on them". I do feel like most of the  areas honestly have cleared back up which is great news. There does not appear to be any signs of active infection at this time which is also great news. No fevers, chills, nausea, vomiting, or diarrhea. 04/18/2021 upon evaluation today patient appears to be doing well with regard to his wounds in fact he appears to be completely healed which is great news. Fortunately there does not appear to be any signs of active infection which is great news. No fevers, chills, nausea, vomiting, or diarrhea. READMISSION 07/28/2021 This is a now 75 year old man we have had in this clinic for quite a bit of this year. 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 He wears compression stockings.  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 He comes in an old running shoes although he says he wears better shoes at home. His history is that he noticed a blister in the right posterior heel. This open. He has been applying Neosporin to it currently the wound measures 2 x 1.4 cm. 100% slough covered. Not really offloading this in any rigorous way. Past medical history is essentially unchanged he has paroxysmal atrial fibrillation on Coumadin type 2 diabetes with a recent hemoglobin A1c of 7 on metformin. ABI in our clinic was 0.98 on the right 08/05/2021 upon evaluation today patient appears to be doing well with regard to his heel ulcer. Fortunately there does not appear to be any signs of active infection at this time. Overall I been very pleased with where things seem to be currently as far as the wound healing is concerned. Again this is first a lot of seeing him Dr. Dellia Nims readmitted him last week. Nonetheless I think we are definitely making some progress here. 08/11/2021 upon evaluation today patient's wound on the heel actually showing signs of excellent improvement. I am actually very pleased with where things stand currently. No fevers, chills, nausea, vomiting, or diarrhea. There does not appear to be any need for sharp debridement today either which is also great news. 08/25/2021 upon evaluation today patient appears to be doing well with regard to his heel ulcer. This is actually looking significantly improved compared to last time I saw him. Fortunately there does not appear to be any evidence of active infection at this time. No fevers, chills, nausea, vomiting, or diarrhea. 09/01/2021 upon evaluation today patient appears to be doing decently well in regard to his wounds. Fortunately there does not appear to be any signs of active infection at this time which is great news and overall very pleased in that regard. I do not see any evidence of  active infection systemically which is great news as well. No fevers, chills, nausea, vomiting, or diarrhea. I think that the heel is making excellent progress. 09/15/2021 upon evaluation today patient's heel unfortunately is significantly worse compared to what it was previous. I do believe that at this time he would benefit from switching back to something a little bit more offloading from the cushion shoe he has right now this is more of a heel protector what I really need is a Prevalon offloading boot which I think is good to do a lot better for him than just the small heel protector. 09/23/2021 upon evaluation today patient appears to be doing a little better in regards to last week's visit. Overall I think that he is making good progress which is great news and there does not appear to  be any evidence of active infection at this time. No fevers, chills, nausea, vomiting, or diarrhea. 09/30/2021 upon evaluation patient's heel is actually showing signs of good improvement which is great news. Fortunately there does not appear to be any evidence of active infection locally nor systemically at this point. No fevers, chills, nausea, vomiting, or diarrhea. 10/07/2021 upon evaluation today patient appears to be doing well currently in regard to his heel ulcer. He has been tolerating the dressing changes without complication. Fortunately I do not see any signs of active infection at this time which is great news. No fevers, chills, nausea, vomiting, or diarrhea. 12/27; wound is measuring smaller. We have been using silver alginate 10/28/2021 upon evaluation today patient appears to be doing well currently in regard to his heel ulcer. I am actually very pleased with where things stand and I think he is making excellent progress. Fortunately I do not see any signs of active infection locally nor systemically at this point which is great news. Nonetheless I do believe that the patient would benefit from a new  offloading shoe and has been using it Velcro is no longer functioning properly and he tells me that he almost tripped and fell because of that today. Obviously I do not want him following that would be very bad. 11/11/2021 upon evaluation today patient appears to be doing excellent in regard to his wound. In fact this appears to be completely healed based on what I see KEMAR, PANDIT ([id number]) [id number] Page 4 of 6 currently. I do not see any signs of anything open or draining and I did double check just by clearing away some of the callus around the edges of the wound to ensure that there was nothing still open or hiding underneath. Once this was cleared away it appeared that the patient was doing significantly better at this time which is great news and there was nothing actually open. Readmission: 06-19-2022 upon evaluation today patient appears to be doing well currently in regard to his heel ulcer all things considered. He unfortunately is having a bit of breakdown in general as far as the heel is concerned not nearly as bad as what we noted last time he was here in the clinic. The good news is I do think that this should hopefully heal much more rapidly than what we noted last time. His past medical history has not changed he is on Coumadin. 07-03-2022 upon evaluation today patient appears to be doing better in regard to his heel. We will start to see some improvement here which is good news. Fortunately I do not see any evidence of infection locally or systemically at this time which is great news. No fevers, chills, nausea, vomiting, or diarrhea. 07-10-2022 upon evaluation today patient appears to be doing well currently in regard to his wound from the callus standpoint around the edges. Unfortunately from an actual wound bed standpoint he has some deep tissue injury noted at this point. Again I am not sure exactly what is going on that is causing this pressure to  the region but he is definitely gotten pressure that is occurring and causing this to not heal as effectively as what we would like to see. I discussed with him today that he may need to at night when he sleeping use a pillow up underneath his calf in order to prevent the heel from touching the bed at all. I also think that it may be beneficial for him to monitor throughout his  day and make sure there is no other time when he is getting the pressure to the heel. 07-23-2022 upon evaluation today patient appears to be doing poorly currently in regard to his heel ulcer. He has been tolerating the dressing changes without complication. Unfortunately I feel like he may be infected based on what I am seeing. 07-28-2022 I did review patient's culture results which showed positive for Proteus as well as Staphylococcus both of which would be treated with the Bactrim DS that I gave him at the last visit. This is good news and hopefully means that we should be able to apply the cast today as long as the wound itself does not appear to be doing poorly. Patient's wound bed actually showed signs of doing quite well and I am very pleased with where things stand and I do not see any signs of worsening overall which is great news. 08-04-2022 upon evaluation today patient appears to be doing well currently in regard to his heel ulcer. I am actually very pleased with where things stand and I do think this is looking significantly better. With regard to his right shin that is also showing signs of improvement which is great news. 08-10-2022 upon evaluation today patient's wound on the heel actually appears to be doing significantly better. In regard to the right anterior shin this is showing signs of healing it might even be completely healed but I still get a monitor I cannot get anything to fill away but also could not see where there was anything actually draining at this point. I am tending to think healed but I want a  monitor 1 more week before I call it for sure. 08-17-2022 upon evaluation today patient appears to be doing well currently in regard to his heel. Fortunately he is tolerating the dressing changes without complication. I do not see any signs of infection and overall I think he is making good progress here. We did get approval for the Apligraf but I think he had a $295 co-pay that would have to be paid per application. With that being said as good as he is doing right now I do not think that is necessary but is still an option depending on how things progress if we need to speed this up quite a bit. 08/24/2022; this patient has a wound on his left heel in the setting of type 2 diabetes. We have been using Prisma. He has a heel offloading boot 08-31-2022 upon evaluation today patient appears to be doing poorly in regard to his heel ulcer which is still showing signs of bruising I am just not as happy as I was when I saw him 2 weeks ago with this. He saw Dr. Dellia Nims last week. Also did not look good at that point apparently. Nonetheless I think that the patient is still continuing to have some counterpressure getting to the wound bed unfortunately. Electronic Signature(s) Signed: 08/31/2022 2:52:36 PM By: Worthy Keeler PA-C Entered By: Worthy Keeler on 08/31/2022 14:52:35 -------------------------------------------------------------------------------- Physician Orders Details Patient Name: Date of Service: [name], [name] [name] 08/31/2022 1:30 PM Medical Record Number: [id number] Patient Account Number: [id number] Date of Birth/Sex: Treating RN: 1946-06-21 (76 y.o. 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 Bathing/ Shower/ Hygiene May shower; gently cleanse wound with antibacterial soap, rinse and pat dry prior to dressing wounds Non-Wound Condition dditional non-wound orders/instructions: - Apply 40% Urea cream to dry cracked skin on feet. A Off-Loading Open toe surgical shoe with peg assist. Wound Treatment Wound #10 - Calcaneus Wound Laterality: Left Prim Dressing: Prisma 4.34 (in) (Generic) 3 x Per Week/30 Days ary Discharge Instructions: Moisten w/normal saline or sterile water; Cover wound as directed. Do not remove from wound bed. Secondary Dressing: ABD Pad 5x9 (in/in) (Generic) 3 x Per Week/30 Days Discharge Instructions: Cover with ABD pad Secured With: ACE WRAP - 27M ACE Elastic Bandage With VELCRO Brand Closure, 4 (in) 3 x Per Week/30 Days Secured With: Conform 4'' - Conforming Stretch [name] Bandage 4x75 (in/in) 3 x Per Week/30 Days Discharge Instructions: Apply as directed Electronic Signature(s) Unsigned Entered By: Massie Kluver on 08/31/2022 14:40:59 -------------------------------------------------------------------------------- Problem List Details Patient Name: Date of Service: [name], [name] [name] 08/31/2022 1:30 PM Medical Record Number: [id number] Patient Account Number: [id number] Date of Birth/Sex: Treating RN: [birth year] (76 y.o. [name] Primary Care Provider: Fulton Reek Other Clinician: Massie Kluver Referring Provider: Treating Provider/Extender: Maurilio Lovely Weeks in Treatment: 10 Active Problems ICD-10 Encounter Code Description Active Date MDM Diagnosis E11.621 Type 2 diabetes mellitus with foot ulcer 06/19/2022 No Yes L97.522 Non-pressure  chronic ulcer of other part of left foot with fat layer exposed 06/19/2022 No Yes E11.42 Type 2 diabetes mellitus with diabetic polyneuropathy 06/19/2022 No Yes Inactive Problems OSMAN, CALZADILLA ([id number]) [id number] Page 6 of 6 Resolved Problems Electronic Signature(s) Signed: 08/31/2022 1:25:04 PM By: Worthy Keeler PA-C Entered By: Worthy Keeler on 08/31/2022 13:25:03

## 2022-08-31 NOTE — Progress Notes (Signed)
ASHAN, CUEVA ([id number]) [id number] Page 1 of 9 Visit Report for 08/31/2022 Arrival Information Details Patient Name: Date of Service: [name], [name] [facility] J. 08/31/2022 1:30 PM Medical Record Number: [id number] Patient Account Number: [id number] Date of Birth/Sex: Treating RN: [birth year] (76 y.o. [name], [name] Primary Care Jaquelin Meaney: Fulton Reek Other Clinician: Massie Kluver Referring Cierrah Dace: Treating Tavoris Brisk/Extender: [name] in Treatment: 10 Visit Information History Since Last Visit All ordered tests and consults were completed: No Patient Arrived: [name] Added or deleted any medications: No Arrival Time: 13:54 Any new allergies or adverse reactions: No Transfer Assistance: None Had a fall or experienced change in No Patient Requires Transmission-Based Precautions: No activities of daily living that may affect Patient Has Alerts: Yes risk of falls: Patient Alerts: Patient on Blood Thinner Signs or symptoms of abuse/neglect since last visito No Warfarin Hospitalized since last visit: No Type II Diabetic Implantable device outside of the clinic excluding No cellular tissue based products placed in the center since last visit: Pain Present Now: No Electronic Signature(s) Unsigned Entered ByMassie Kluver on 08/31/2022 13:58:19 -------------------------------------------------------------------------------- Clinic Level of Care Assessment Details Patient Name: Date of Service: [name], [name] [facility] J. 08/31/2022 1:30 PM Medical Record Number: [id number] Patient Account Number: [id number] Date of Birth/Sex: Treating RN: 10-[name]-1946 (76 y.o. [name] Primary Care Keivon Garden: Fulton Reek Other Clinician: Massie Kluver Referring Ordell Prichett: Treating Anwar Crill/Extender: [name] in Treatment: 10 Clinic Level of Care Assessment Items TOOL 1 Quantity Score _0  - 0 Use when EandM and  Procedure is performed on INITIAL visit ASSESSMENTS - Nursing Assessment / Reassessment _1  - 0 General Physical Exam (combine w/ comprehensive assessment (listed just below) when performed on new pt. [address] MEL, LANGAN ([id number]) [id number] Page 2 of 9 _2  - 0 Comprehensive Assessment (HX, ROS, Risk Assessments, Wounds Hx, etc.) ASSESSMENTS - Wound and Skin Assessment / Reassessment _3  - 0 Dermatologic / Skin Assessment (not related to wound area) ASSESSMENTS - Ostomy and/or Continence Assessment and Care _4  - 0 Incontinence Assessment and Management _5  - 0 Ostomy Care Assessment and Management (repouching, etc.) PROCESS - Coordination of Care _6  - 0 Simple Patient / Family Education for ongoing care _7  - 0 Complex (extensive) Patient / Family Education for ongoing care _8  - 0 Staff obtains Programmer, systems, Records, T Results / Process Orders est _9  - 0 Staff telephones HHA, Nursing Homes / Clarify orders / etc _10  - 0 Routine Transfer to another Facility (non-emergent condition) _11  - 0 Routine Hospital Admission (non-emergent condition) _12  - 0 New Admissions / Biomedical engineer / Ordering NPWT Apligraf, etc. , _13  - 0 Emergency Hospital Admission (emergent condition) PROCESS - Special Needs _14  - 0 Pediatric / Minor Patient Management _15  - 0 Isolation Patient Management _16  - 0 Hearing / Language / Visual special needs _17  - 0 Assessment of Community assistance (transportation, D/C planning, etc.) _18  - 0 Additional assistance / Altered mentation _19  - 0 Support Surface(s) Assessment (bed, cushion, seat, etc.) INTERVENTIONS - Miscellaneous _20  - 0 External ear exam _21  - 0 Patient Transfer (multiple staff / Civil Service fast streamer / Similar devices) _22  - 0 Simple Staple / Suture removal (25 or less) _23  - 0 Complex Staple / Suture removal (26 or more) _24  - 0 Hypo/Hyperglycemic Management (do not check if billed separately) _25  - 0 Ankle / Brachial  Index (ABI) - do not check if billed separately Has the patient been seen at the hospital within the last three years: Yes  Total Score: 0 Level Of Care: ____ Electronic Signature(s) Unsigned Entered By: Massie Kluver on 08/31/2022 14:41:08 -------------------------------------------------------------------------------- Lower Extremity Assessment Details Patient Name: Date of Service: [name], [name] [name] 08/31/2022 1:30 PM Medical Record Number: [id number] Patient Account Number: [id number] [name], [name] ([id number]) [id number] Page 3 of 9 Date of Birth/Sex: Treating RN: 05-18-1946 (76 y.o. [name] Primary Care Arthuro Canelo: Fulton Reek Other Clinician: Massie Kluver Referring Sherley Mckenney: Treating Caz Weaver/Extender: Maurilio Lovely Weeks in Treatment: 10 Edema Assessment Assessed: [name]: Yes] [Right: No] Edema: [Left: Ye] [Right: s] Calf Left: Right: Point of Measurement: 33 cm From Medial Instep 43 cm Ankle Left: Right: Point of Measurement: 11 cm From Medial Instep 26.9 cm Vascular Assessment Pulses: Dorsalis Pedis Palpable: [Left:Yes] Electronic Signature(s) Unsigned Entered ByMassie Kluver on 08/31/2022 14:13:49 -------------------------------------------------------------------------------- Multi Wound Chart Details Patient Name: Date of Service: [name], [name] [name] 08/31/2022 1:30 PM Medical Record Number: [id number] Patient Account Number: [id number] Date of Birth/Sex: Treating RN: May 11, 1946 (76 y.o. [name] Primary Care Mithcell Schumpert: Fulton Reek Other Clinician: Massie Kluver Referring Jessamy Torosyan: Treating Edelmiro Innocent/Extender: Maurilio Lovely Weeks in Treatment: 10 Vital Signs Height(in): 70 Pulse(bpm): 59 Weight(lbs): 265 Blood Pressure(mmHg): 146/73 Body Mass Index(BMI): 38 Temperature(F): 98.3 Respiratory Rate(breaths/min): 18 [10:Photos:] [N/A:N/A] Left Calcaneus Left, Anterior  Lower Leg N/A Wound Location: Gradually Appeared Trauma N/A Wounding Event: [name], [name] ([id number]) [id number] Page 4 of 9 Diabetic Wound/Ulcer of the Lower Trauma, Other N/A Primary Etiology: Extremity Pressure Ulcer N/A N/A Secondary Etiology: Cataracts, Arrhythmia, Coronary Cataracts, Arrhythmia, Coronary N/A Comorbid History: Artery Disease, Hypertension, Type IIArtery Disease, Hypertension, Type II Diabetes, Osteoarthritis, Neuropathy Diabetes, Osteoarthritis, Neuropathy 06/05/2022 08/28/2022 N/A Date Acquired: 10 0 N/A Weeks of Treatment: Open Open N/A Wound Status: No No N/A Wound Recurrence: 1.4x2.3x0.1 1.3x2.3x0.1 N/A Measurements L x W x D (cm) 2.529 2.348 N/A A (cm) : rea 0.253 0.235 N/A Volume (cm) : -78.90% N/A N/A % Reduction in Area: 10.60% N/A N/A % Reduction in Volume: Grade 1 Full Thickness Without Exposed N/A Classification: Support Structures Medium Medium N/A Exudate Amount: Serosanguineous Serosanguineous N/A Exudate Type: red, brown red, brown N/A Exudate Color: Flat and Intact Flat and Intact N/A Wound Margin: Small (1-33%) None Present (0%) N/A Granulation Amount: Pink N/A N/A Granulation Quality: Large (67-100%) Small (1-33%) N/A Necrotic Amount: Fat Layer (Subcutaneous Tissue): Yes Fat Layer (Subcutaneous Tissue): Yes N/A Exposed Structures: Fascia: No Fascia: No Tendon: No Tendon: No Muscle: No Muscle: No Joint: No Joint: No Bone: No Bone: No Small (1-33%) None N/A Epithelialization: Treatment Notes Electronic Signature(s) Unsigned Entered ByMassie Kluver on 08/31/2022 14:14:21 -------------------------------------------------------------------------------- Multi-Disciplinary Care Plan Details Patient Name: Date of Service: [name], [name] [facility] J. 08/31/2022 1:30 PM Medical Record Number: [id number] Patient Account Number: [id number] Date of Birth/Sex: Treating RN: Feb 04, 1946 (76 y.o.  [name] Primary Care Shawnika Pepin: Fulton Reek Other Clinician: Massie Kluver Referring Kaien Pezzullo: Treating Kelyn Ponciano/Extender: Maurilio Lovely Weeks in Treatment: 10 Active Inactive Pressure Nursing Diagnoses: Knowledge deficit related to causes and risk factors for pressure ulcer development Knowledge deficit related to management of pressures ulcers Potential for impaired tissue integrity related to pressure, friction, moisture, and shear Goals: Patient will remain free from development of additional pressure ulcers Date Initiated: 06/19/2022 Date Inactivated: 07/28/2022 Target Resolution Date: 06/19/2022 Goal Status: Met [name], [name] ([id number]) [id number] Page 5 of 9 Patient/caregiver will verbalize risk factors for pressure ulcer development Date Initiated: 06/19/2022 Date Inactivated: 07/28/2022 Target Resolution Date: 06/19/2022 Goal Status: Met  Patient/caregiver will verbalize understanding of pressure ulcer management Date Initiated: 06/19/2022 Target Resolution Date: 08/14/2022 Goal Status: Active Interventions: Assess: immobility, friction, shearing, incontinence upon admission and as needed Assess offloading mechanisms upon admission and as needed Assess potential for pressure ulcer upon admission and as needed Provide education on pressure ulcers Notes: Wound/Skin Impairment Nursing Diagnoses: Impaired tissue integrity Goals: Patient/caregiver will verbalize understanding of skin care regimen Date Initiated: 06/19/2022 Date Inactivated: 07/10/2022 Target Resolution Date: 06/19/2022 Goal Status: Met Ulcer/skin breakdown will have a volume reduction of 30% by week 4 Date Initiated: 06/19/2022 Date Inactivated: 07/28/2022 Target Resolution Date: 07/17/2022 Goal Status: Unmet Unmet Reason: infection Ulcer/skin breakdown will have a volume reduction of 50% by week 8 Date Initiated: 07/28/2022 Target Resolution Date:  08/14/2022 Goal Status: Active Interventions: Assess patient/caregiver ability to obtain necessary supplies Assess patient/caregiver ability to perform ulcer/skin care regimen upon admission and as needed Assess ulceration(s) every visit Provide education on ulcer and skin care Treatment Activities: Skin care regimen initiated : 06/19/2022 Topical wound management initiated : 06/19/2022 Notes: Electronic Signature(s) Unsigned Entered By: Massie Kluver on 08/31/2022 14:13:58 -------------------------------------------------------------------------------- Pain Assessment Details Patient Name: Date of Service: [name], [name] [name] 08/31/2022 1:30 PM Medical Record Number: [id number] Patient Account Number: [id number] Date of Birth/Sex: Treating RN: [birth year] (76 y.o. [name] Primary Care Nikea Settle: Fulton Reek Other Clinician: Massie Kluver Referring Aldair Rickel: Treating Jerrye Seebeck/Extender: Maurilio Lovely Weeks in Treatment: 10 Active Problems Location of Pain Severity and Description of Pain Patient Has Paino No [name], [name] ([id number]) [id number] Page 6 of 9 Patient Has Paino No Site Locations Pain Management and Medication Current Pain Management: Electronic Signature(s) Unsigned Entered By: Massie Kluver on 08/31/2022 14:02:51 -------------------------------------------------------------------------------- Patient/Caregiver Education Details Patient Name: Date of Service: [name] [facility] J. 11/13/2023andnbsp1:30 PM Medical Record Number: [id number] Patient Account Number: [id number] Date of Birth/Gender: Treating RN: Nov 15, 1945 (76 y.o. [name] Primary Care Physician: Fulton Reek Other Clinician: Massie Kluver Referring Physician: Treating Physician/Extender: [name] in Treatment: 10 Education Assessment Education Provided To: Patient Education Topics Provided Wound/Skin  Impairment: Handouts: Other: continue wound care as directed Methods: Explain/Verbal Responses: State content correctly Electronic Signature(s) Unsigned Entered By: Massie Kluver on 08/31/2022 14:42:31 Signature(s): [name], [name] ([id number]) [phone] Date(s): [id number] Page 7 of 9 -------------------------------------------------------------------------------- Wound Assessment Details Patient Name: Date of Service: [name], [name] [name] 08/31/2022 1:30 PM Medical Record Number: [id number] Patient Account Number: [id number] Date of Birth/Sex: Treating RN: 25-Jul-1946 (76 y.o. [name], [name] Primary Care Deseray Daponte: Fulton Reek Other Clinician: Massie Kluver Referring Khaliq Turay: Treating Keyshun Elpers/Extender: Maurilio Lovely Weeks in Treatment: 10 Wound Status Wound Number: 10 Primary Diabetic Wound/Ulcer of the Lower Extremity Etiology: Wound Location: Left Calcaneus Secondary Pressure Ulcer Wounding Event: Gradually Appeared Etiology: Date Acquired: 06/05/2022 Wound Open Weeks Of Treatment: 10 Status: Clustered Wound: No Comorbid Cataracts, Arrhythmia, Coronary Artery Disease, Hypertension, History: Type II Diabetes, Osteoarthritis, Neuropathy Photos Wound Measurements Length: (cm) 1.4 Width: (cm) 2.3 Depth: (cm) 0.1 Area: (cm) 2.529 Volume: (cm) 0.253 % Reduction in Area: -78.9% % Reduction in Volume: 10.6% Epithelialization: Small (1-33%) Tunneling: No Undermining: No Wound Description Classification: Grade 1 Wound Margin: Flat and Intact Exudate Amount: Medium Exudate Type: Serosanguineous Exudate Color: red, brown Foul Odor After Cleansing: No Slough/Fibrino Yes Wound Bed Granulation Amount: Small (1-33%) Exposed Structure Granulation Quality: Pink Fascia Exposed: No Necrotic Amount: Large (67-100%) Fat Layer (Subcutaneous Tissue) Exposed: Yes Necrotic Quality: Adherent Slough Tendon Exposed: No Muscle Exposed:  No Joint Exposed: No Bone Exposed: No Electronic Signature(s) Unsigned Entered By: Massie Kluver on 08/31/2022 14:09:57 Signature(s): [name], [name] ([id number]) [id number] Date(s): [id number] Page 8 of 9 -------------------------------------------------------------------------------- Wound Assessment Details Patient Name: Date of Service: [name], [name] [name] 08/31/2022 1:30 PM Medical Record Number: [id number] Patient Account Number: [id number] Date of Birth/Sex: Treating RN: July 26, 1946 (76 y.o. [name], [name] Primary Care Lasondra Hodgkins: Fulton Reek Other Clinician: Massie Kluver Referring Pahola Dimmitt: Treating Lynnix Schoneman/Extender: Maurilio Lovely Weeks in Treatment: 10 Wound Status Wound Number: 13 Primary Trauma, Other Etiology: Wound Location: Left, Anterior Lower Leg Wound Open Wounding Event: Trauma Status: Date Acquired: 08/28/2022 Comorbid Cataracts, Arrhythmia, Coronary Artery Disease, Hypertension, Weeks Of Treatment: 0 History: Type II Diabetes, Osteoarthritis, Neuropathy Clustered Wound: No Photos Wound Measurements Length: (cm) 1.3 Width: (cm) 2.3 Depth: (cm) 0.1 Area: (cm) 2.348 Volume: (cm) 0.235 % Reduction in Area: % Reduction in Volume: Epithelialization: None Tunneling: No Undermining: No Wound Description Classification: Full Thickness Without Exposed Support Structures Wound Margin: Flat and Intact Exudate Amount: Medium Exudate Type: Serosanguineous Exudate Color: red, brown Foul Odor After Cleansing: No Slough/Fibrino No Wound Bed Granulation Amount: None Present (0%) Exposed Structure Necrotic Amount: Small (1-33%) Fascia Exposed: No Necrotic Quality: Adherent Slough Fat Layer (Subcutaneous Tissue) Exposed: Yes Tendon Exposed: No Muscle Exposed: No Joint Exposed: No Bone Exposed: No Electronic Signature(s) Unsigned Entered ByMassie Kluver on 08/31/2022 14:12:38 Signature(s): [name], [name]  ([id number]) [phone] Date(s): [id number] Page 9 of 9 -------------------------------------------------------------------------------- Vitals Details Patient Name: Date of Service: [name], [name] [name] 08/31/2022 1:30 PM Medical Record Number: [id number] Patient Account Number: [id number] Date of Birth/Sex: Treating RN: Mar 20, 1946 (76 y.o. [name], [name] Primary Care Javeion Cannedy: Fulton Reek Other Clinician: Massie Kluver Referring Rhiannan Kievit: Treating Eoghan Belcher/Extender: Maurilio Lovely Weeks in Treatment: 10 Vital Signs Time Taken: 13:58 Temperature (F): 98.3 Height (in): 70 Pulse (bpm): 59 Weight (lbs): 265 Respiratory Rate (breaths/min): 18 Body Mass Index (BMI): 38 Blood Pressure (mmHg): 146/73 Reference Range: 80 - 120 mg / dl Electronic Signature(s) Unsigned Entered ByMassie Kluver on 08/31/2022 14:02:45 Signature(s): Date(s):

## 2022-09-07 ENCOUNTER — Encounter: Payer: Medicare HMO | Admitting: Physician Assistant

## 2022-09-07 DIAGNOSIS — E11621 Type 2 diabetes mellitus with foot ulcer: Secondary | ICD-10-CM | POA: Diagnosis not present

## 2022-09-07 NOTE — Progress Notes (Signed)
TERIK, HAUGHEY ([id number]) [id number] Page 1 of 11 Visit Report for 09/07/2022 Chief Complaint Document Details Patient Name: Date of Service: [name], GEO [facility] J. 09/07/2022 1:30 PM Medical Record Number: [id number] Patient Account Number: [id number] Date of Birth/Sex: Treating RN: 1945/12/20 (76 y.o. [name] Primary Care Provider: Fulton Reek Other Clinician: Massie Kluver Referring Provider: Treating Provider/Extender: Maurilio Lovely Weeks in Treatment: 11 Information Obtained from: Patient Chief Complaint Left heel ulcer Electronic Signature(s) Signed: 09/07/2022 1:54:34 PM By: Worthy Keeler PA-C Entered By: Worthy Keeler on 09/07/2022 13:54:34 -------------------------------------------------------------------------------- Debridement Details Patient Name: Date of Service: [name], GEO [name] J. 09/07/2022 1:30 PM Medical Record Number: [id number] Patient Account Number: [id number] Date of Birth/Sex: Treating RN: 21-Nov-1945 (76 y.o. [name] Primary Care Provider: Fulton Reek Other Clinician: Massie Kluver Referring Provider: Treating Provider/Extender: [name] in Treatment: 11 Debridement Performed for Assessment: Wound #10 Left Calcaneus Performed By: Physician Tommie Sams., PA-C Debridement Type: Debridement Severity of Tissue Pre Debridement: Fat layer exposed Level of Consciousness (Pre-procedure): Awake and Alert Pre-procedure Verification/Time Out Yes - 13:57 Taken: Start Time: 13:57 T Area Debrided (L x W): otal 2.5 (cm) x 3 (cm) = 7.5 (cm) Tissue and other material debrided: Viable, Non-Viable, Callus, Slough, Subcutaneous, Slough Level: Skin/Subcutaneous Tissue Debridement Description: Excisional Instrument: Curette Bleeding: Minimum Hemostasis Achieved: Pressure Response to Treatment: Procedure was tolerated well Level of Consciousness (Post- Awake and  Alert procedure): [name], [name] ([id number]) [id number] Page 2 of 11 Post Debridement Measurements of Total Wound Length: (cm) 1.5 Width: (cm) 2.1 Depth: (cm) 0.1 Volume: (cm) 0.247 Character of Wound/Ulcer Post Debridement: Stable Severity of Tissue Post Debridement: Fat layer exposed Post Procedure Diagnosis Same as Pre-procedure Electronic Signature(s) Unsigned Entered ByMassie Kluver on 09/07/2022 14:01:51 -------------------------------------------------------------------------------- HPI Details Patient Name: Date of ServicePrimitivo [name], GEO [name] J. 09/07/2022 1:30 PM Medical Record Number: [id number] Patient Account Number: [id number] Date of Birth/Sex: Treating RN: 10/02/1946 (76 y.o. [name] Primary Care Provider: Fulton Reek Other Clinician: Massie Kluver Referring Provider: Treating Provider/Extender: Maurilio Lovely Weeks in Treatment: 11 History of Present Illness HPI Description: 09/24/2020 on evaluation today patient presents today for a heel ulcer that he tells me has been present for about 2 years. He has been seeing podiatry and they have been attempting to manage this including what sounds to be a total contact cast, Unna boot, and just standard dressings otherwise as well. Most recently has been using triple antibiotic ointment. With that being said unfortunately despite everything he really has not had any significant improvement. He tells me that he cannot even really remember exactly how this began but he presumed it may have rubbed on his shoes or something of that nature. 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 He tells me  that this does seem to help. Fortunately there is no signs of active infection at this time. His most recent hemoglobin A1c was 8.1 though he knows this was this year he cannot tell me the exact time. His fluid pills currently to help with some of the lower extremity edema although he does obviously have signs of venous stasis/lymphedema. Currently there is no evidence  of active infection. No fevers, chills, nausea, vomiting, or diarrhea. Patient has had fairly recent ABIs which were performed on 07/19/2020 and revealed that he has normal findings in both the ankle and toe locations bilaterally. His ABI on the right was 1.09 on the left was 1.08 with a TBI on the right of 0.88 and on the left of 0.94. Triphasic flow was noted throughout. 10/08/2020 on evaluation today patient appears to be doing pretty well in regard to his left heel currently in fact this is doing a great job and seems to be healing quite nicely. Unfortunately on his right leg he had a pile of wood that actually fell on him injuring his right leg this is somewhat erythematous has me concerned little bit about cellulitis though there is not really a good area to culture at this point. 10/24/2020 upon evaluation today patient appears to be doing well with regard to his heel ulcer. He is showing signs of improvement which is great news. His right leg is completely healed. Overall I feel like he is doing excellent and there is no signs of infection. 11/07/2020 upon evaluation today patient appears to be doing well with regard to his heel ulcer. He tells me that last week when he was unable to come in his wife actually thought that the wound was very close to closing if not closed. Then it began to "reopen again". I really feel like what may have happened as the collagen may have dried over the wound bed and that because that misunderstanding with thinking that the wound was healing. With that being said I did not see it last week I do not  know that for certain. Either way I feel like he is doing great today I see no signs of infection at this point. 11/26/2020 upon inspection today patient appears to be doing decently well in regard to his heel ulcer. He has been tolerating the dressing changes without complication. Fortunately there is no sign of active infection at this time. No fevers, chills, nausea, vomiting, or diarrhea. 12/10/2020 upon evaluation today patient appears to be doing fairly well in regard to the wound on his heel as well as what appears to be a new wound of the left first metatarsal head plantar aspect. This seems to be an area that was callus that has split as the patient tells me has been trying to walk on his toes more has probably where this came from. With that being said there does not appear to be signs of active infection which is great news. 12/17/2020 upon evaluation today patient actually appears to be making good progress currently. Fortunately there is no evidence of active infection at this time. Overall I feel like he is very close to complete closure. 12/26/2020 upon evaluation today patient appears to be doing excellent in regard to his wounds. In fact I am not certain that these are not even completely healed on initial inspection. Overall I am very pleased with where things stand today. Good news is that they are healed he is actually get ready to go out of town and that will be helpful as well as he will be a full part of the time. JODY, SILAS ([id number]) [id number] Page 3 of 11 Readmission: 02/04/2021 upon evaluation today patient appears to be doing well at this point in regard to his left heel that I previously saw him for. Unfortunately he is having issues with his right lower extremity. He has significant wounds at  this point he also has erythema noted there is definite signs of cellulitis which is unfortunate. With that being said I think we do need to address  this sooner rather than later. The good news is he did have a nice trip to the beach. He tells me that he had no issues during that time. 4/27; patient on Bactrim. Culture showed methicillin sensitive staph aureus therefore the Bactrim should be effective. 2 small areas on the right leg are healed the area on the mid aspect of the tibia almost 100% covered in a very adherent necrotic debris. We have been using silver alginate 02/20/2021 upon evaluation today patient appears to be doing well with regard to his wound. He is showing signs of improvement which is great news overall very pleased with where things stand today. No fevers, chills, nausea, vomiting, or diarrhea. 02/27/2021 upon evaluation today patient appears to be doing well with regard to his leg ulcer. He is tolerating the dressing changes and overall appears to be doing quite excellent. I am extremely pleased with where things stand and overall I think patient is making great progress. There is no sign of active infection at this time which is also great news. 03/06/2021 upon evaluation today patient appears to be doing excellent in regard to his wounds. In fact he appears to be completely healed today based on what I am seeing. This is excellent news and overall I am extremely pleased with where he stands. Overall the patient is happy to hear this as well this has been quite sometime coming. Readmission: 04/01/2021 patient unfortunately returns for readmission today. He tells me that he has been wearing his compression socks on the right leg daily and he does not really know what is going on and why his legs are doing what they are doing. We did therefore go ahead and probe deeper into exactly what has been going on with him today. Subsequently the patient tells me that when he gets up and what we would call "first thing in the morning" are really 2 different things". He does not tend to sleep well so he tells me that he will often wake up  at 3:00 in the morning. He will then potentially going into the living room to get in his chair where he may read a book for a little while and then potentially fall asleep back in his chair. He then subsequently wake up around 5:00 or so and then get up and in his words "putter around". This often will end with him proceeding at some point around 8 AM or 9 AM to putting on his compression socks. With that being said this means that anywhere from the 3:00 in the morning till roughly around 9:00 in the morning he has no compression on yet he is sitting in his recliner, walking around and up and about, and this is at least 5 to 6 hours of noncompressed time. That may be our issue here but I did not realize until we discussed this further today. Fortunately there does not appear to be any signs of active infection at this time which is great news. With that being said he has multiple wounds of the bilateral lower extremities. 04/10/2021 upon evaluation today patient appears to be doing well with regard to his legs for the most part. He does look like he had some injury where his wrap slid down nonetheless he did some "doctoring on them". I do feel like most of the areas honestly  have cleared back up which is great news. There does not appear to be any signs of active infection at this time which is also great news. No fevers, chills, nausea, vomiting, or diarrhea. 04/18/2021 upon evaluation today patient appears to be doing well with regard to his wounds in fact he appears to be completely healed which is great news. Fortunately there does not appear to be any signs of active infection which is great news. No fevers, chills, nausea, vomiting, or diarrhea. READMISSION 07/28/2021 This is a now 76 year old man we have had in this clinic for quite a bit of this year. 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 He wears compression stockings.  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 He comes in an old running shoes although he says he wears better shoes at home. His history is that he noticed a blister in the right posterior heel. This open. He has been applying Neosporin to it currently the wound measures 2 x 1.4 cm. 100% slough covered. Not really offloading this in any rigorous way. Past medical history is essentially unchanged he has paroxysmal atrial fibrillation on Coumadin type 2 diabetes with a recent hemoglobin A1c of 7 on metformin. ABI in our clinic was 0.98 on the right 08/05/2021 upon evaluation today patient appears to be doing well with regard to his heel ulcer. Fortunately there does not appear to be any signs of active infection at this time. Overall I been very pleased with where things seem to be currently as far as the wound healing is concerned. Again this is first a lot of seeing him Dr. Dellia Nims readmitted him last week. Nonetheless I think we are definitely making some progress here. 08/11/2021 upon evaluation today patient's wound on the heel actually showing signs of excellent improvement. I am actually very pleased with where things stand currently. No fevers, chills, nausea, vomiting, or diarrhea. There does not appear to be any need for sharp debridement today either which is also great news. 08/25/2021 upon evaluation today patient appears to be doing well with regard to his heel ulcer. This is actually looking significantly improved compared to last time I saw him. Fortunately there does not appear to be any evidence of active infection at this time. No fevers, chills, nausea, vomiting, or diarrhea. 09/01/2021 upon evaluation today patient appears to be doing decently well in regard to his wounds. Fortunately there does not appear to be any signs of active infection at this time which is great news and overall very pleased in that regard. I do not see any evidence of  active infection systemically which is great news as well. No fevers, chills, nausea, vomiting, or diarrhea. I think that the heel is making excellent progress. 09/15/2021 upon evaluation today patient's heel unfortunately is significantly worse compared to what it was previous. I do believe that at this time he would benefit from switching back to something a little bit more offloading from the cushion shoe he has right now this is more of a heel protector what I really need is a Prevalon offloading boot which I think is good to do a lot better for him than just the small heel protector. 09/23/2021 upon evaluation today patient appears to be doing a little better in regards to last week's visit. Overall I think that he is making good progress which is great news and there does not appear to be any  evidence of active infection at this time. No fevers, chills, nausea, vomiting, or diarrhea. 09/30/2021 upon evaluation patient's heel is actually showing signs of good improvement which is great news. Fortunately there does not appear to be any evidence of active infection locally nor systemically at this point. No fevers, chills, nausea, vomiting, or diarrhea. 10/07/2021 upon evaluation today patient appears to be doing well currently in regard to his heel ulcer. He has been tolerating the dressing changes without complication. Fortunately I do not see any signs of active infection at this time which is great news. No fevers, chills, nausea, vomiting, or diarrhea. 12/27; wound is measuring smaller. We have been using silver alginate 10/28/2021 upon evaluation today patient appears to be doing well currently in regard to his heel ulcer. I am actually very pleased with where things stand and I think he is making excellent progress. Fortunately I do not see any signs of active infection locally nor systemically at this point which is great news. Nonetheless I do believe that the patient would benefit from a new  offloading shoe and has been using it Velcro is no longer functioning properly and he tells me that he almost tripped and fell because of that today. Obviously I do not want him following that would be very bad. 11/11/2021 upon evaluation today patient appears to be doing excellent in regard to his wound. In fact this appears to be completely healed based on what I see JAIRE, PINKHAM ([id number]) [id number] Page 4 of 11 currently. I do not see any signs of anything open or draining and I did double check just by clearing away some of the callus around the edges of the wound to ensure that there was nothing still open or hiding underneath. Once this was cleared away it appeared that the patient was doing significantly better at this time which is great news and there was nothing actually open. Readmission: 06-19-2022 upon evaluation today patient appears to be doing well currently in regard to his heel ulcer all things considered. He unfortunately is having a bit of breakdown in general as far as the heel is concerned not nearly as bad as what we noted last time he was here in the clinic. The good news is I do think that this should hopefully heal much more rapidly than what we noted last time. His past medical history has not changed he is on Coumadin. 07-03-2022 upon evaluation today patient appears to be doing better in regard to his heel. We will start to see some improvement here which is good news. Fortunately I do not see any evidence of infection locally or systemically at this time which is great news. No fevers, chills, nausea, vomiting, or diarrhea. 07-10-2022 upon evaluation today patient appears to be doing well currently in regard to his wound from the callus standpoint around the edges. Unfortunately from an actual wound bed standpoint he has some deep tissue injury noted at this point. Again I am not sure exactly what is going on that is causing this pressure  to the region but he is definitely gotten pressure that is occurring and causing this to not heal as effectively as what we would like to see. I discussed with him today that he may need to at night when he sleeping use a pillow up underneath his calf in order to prevent the heel from touching the bed at all. I also think that it may be beneficial for him to monitor throughout his day and  make sure there is no other time when he is getting the pressure to the heel. 07-23-2022 upon evaluation today patient appears to be doing poorly currently in regard to his heel ulcer. He has been tolerating the dressing changes without complication. Unfortunately I feel like he may be infected based on what I am seeing. 07-28-2022 I did review patient's culture results which showed positive for Proteus as well as Staphylococcus both of which would be treated with the Bactrim DS that I gave him at the last visit. This is good news and hopefully means that we should be able to apply the cast today as long as the wound itself does not appear to be doing poorly. Patient's wound bed actually showed signs of doing quite well and I am very pleased with where things stand and I do not see any signs of worsening overall which is great news. 08-04-2022 upon evaluation today patient appears to be doing well currently in regard to his heel ulcer. I am actually very pleased with where things stand and I do think this is looking significantly better. With regard to his right shin that is also showing signs of improvement which is great news. 08-10-2022 upon evaluation today patient's wound on the heel actually appears to be doing significantly better. In regard to the right anterior shin this is showing signs of healing it might even be completely healed but I still get a monitor I cannot get anything to fill away but also could not see where there was anything actually draining at this point. I am tending to think healed but I want  a monitor 1 more week before I call it for sure. 08-17-2022 upon evaluation today patient appears to be doing well currently in regard to his heel. Fortunately he is tolerating the dressing changes without complication. I do not see any signs of infection and overall I think he is making good progress here. We did get approval for the Apligraf but I think he had a $295 co-pay that would have to be paid per application. With that being said as good as he is doing right now I do not think that is necessary but is still an option depending on how things progress if we need to speed this up quite a bit. 08/24/2022; this patient has a wound on his left heel in the setting of type 2 diabetes. We have been using Prisma. He has a heel offloading boot 08-31-2022 upon evaluation today patient appears to be doing poorly in regard to his heel ulcer which is still showing signs of bruising I am just not as happy as I was when I saw him 2 weeks ago with this. He saw Dr. Dellia Nims last week. Also did not look good at that point apparently. Nonetheless I think that the patient is still continuing to have some counterpressure getting to the wound bed unfortunately. 09-07-2022 upon evaluation today patient appears to be doing well currently in regard to his heel ulcer. He has been tolerating the dressing changes without complication. Fortunately there does not appear to be any signs of active infection locally or systemically at this time. Fortunately I do not see any evidence of active infection at this time. Electronic Signature(s) Signed: 09/07/2022 2:03:49 PM By: Worthy Keeler PA-C Entered By: Worthy Keeler on 09/07/2022 14:03:49 -------------------------------------------------------------------------------- Physical Exam Details Patient Name: Date of Service: [name], GEO [name] J. 09/07/2022 1:30 PM Medical Record Number: [id number] Patient Account Number: [id number] Date of Birth/Sex:  Treating RN: [birth year]  (76 y.o. [name] Primary Care Provider: Fulton Reek Other Clinician: Massie Kluver Referring Provider: Treating Provider/Extender: Maurilio Lovely Weeks in Treatment: 40 Constitutional Well-nourished and well-hydrated in no acute distress. Respiratory normal breathing without difficulty. Psychiatric MCKINNON, GLICK ([id number]) [id number] Page 5 of 11 this patient is able to make decisions and demonstrates good insight into disease process. Alert and Oriented x 3. pleasant and cooperative. Notes Patient's wound bed actually showed signs of good granulation and epithelization at this point. Unfortunately is having a lot more weeping from his leg and I think we need to do something to try to get this under control. We can actually put him in a compression wrap today which I think will be beneficial as well. Electronic Signature(s) Signed: 09/07/2022 2:04:09 PM By: Worthy Keeler PA-C Entered By: Worthy Keeler on 09/07/2022 14:04:08 -------------------------------------------------------------------------------- Physician Orders Details Patient Name: Date of Service: [name], GEO [name] J. 09/07/2022 1:30 PM Medical Record Number: [id number] Patient Account Number: [id number] Date of Birth/Sex: Treating RN: May 14, 1946 (76 y.o. 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 Bathing/ Shower/ Hygiene May shower; gently cleanse wound with antibacterial  soap, rinse and pat dry prior to dressing wounds Edema Control - Lymphedema / Segmental Compressive Device / Other 3 Layer Compression System for Lymphedema. - left lower leg Non-Wound Condition dditional non-wound orders/instructions: - Apply 40% Urea cream to dry cracked skin on feet. A Off-Loading Open toe surgical shoe with peg assist. Wound Treatment Wound #10 - Calcaneus Wound Laterality: Left Prim Dressing: Prisma 4.34 (in) (Generic) 3 x Per Week/30 Days ary Discharge Instructions: Moisten w/normal saline or sterile water; Cover wound as directed. Do not remove from wound bed. Secondary Dressing: Zetuvit Plus 4x4 (in/in) 3 x Per Week/30 Days Compression Wrap: 3-LAYER WRAP - Profore Lite LF 3 Multilayer Compression Bandaging System 3 x Per Week/30 Days Discharge Instructions: Apply 3 multi-layer wrap as prescribed. Wound #13 - Lower Leg Wound Laterality: Left, Anterior Prim Dressing: Prisma 4.34 (in) (Generic) 3 x Per Week/30 Days ary Discharge Instructions: Moisten w/normal saline or sterile water; Cover wound as directed. Do not remove from wound bed. Secondary Dressing: Zetuvit Plus 4x4 (in/in) 3 x Per Week/30 Days EUGINE, BUBB ([id number]) [id number] Page 6 of 11 Compression Wrap: 3-LAYER WRAP - Profore Lite LF 3 Multilayer Compression Bandaging System 3 x Per Week/30 Days Discharge Instructions: Apply 3 multi-layer wrap as prescribed. Electronic Signature(s) Unsigned Entered By: Massie Kluver on 09/07/2022 14:02:40 -------------------------------------------------------------------------------- Problem List Details Patient Name: Date of Service: [name], GEO [name] J. 09/07/2022 1:30 PM Medical Record Number: [id number] Patient Account Number: [id number] Date of Birth/Sex: Treating RN: 1946-03-16 (76 y.o. [name] Primary Care Provider: Fulton Reek Other Clinician: Massie Kluver Referring Provider: Treating Provider/Extender:  Maurilio Lovely Weeks in Treatment: 11 Active Problems ICD-10 Encounter Code Description Active Date MDM Diagnosis E11.621 Type 2 diabetes mellitus with foot ulcer 06/19/2022 No Yes 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sed 06/19/2022 No Yes E11.42 Type 2 diabetes mellitus with diabetic  polyneuropathy 06/19/2022 No Yes Inactive Problems Resolved Problems Electronic Signature(s) Signed: 09/07/2022 1:54:26 PM By: Worthy Keeler PA-C Entered By: Worthy Keeler on 09/07/2022 13:54:26 [name] ([id number]) [id number] Page 7 of 11 -------------------------------------------------------------------------------- Progress Note Details Patient Name: Date of Service: [name], GEO [name] J. 09/07/2022 1:30 PM Medical Record Number: [id number] Patient Account Number: [id number] Date of Birth/Sex: Treating RN: May 22, 1946 (76 y.o. [name] Primary Care Provider: Fulton Reek Other Clinician: Massie Kluver Referring Provider: Treating Provider/Extender: Maurilio Lovely Weeks in Treatment: 11 Subjective Chief Complaint Information obtained from Patient Left heel ulcer History of Present Illness (HPI) 09/24/2020 on evaluation today patient presents today for a heel ulcer that he tells me has been present for about 2 years. He has been seeing podiatry and they have been attempting to manage this including what sounds to be a total contact cast, Unna boot, and just standard dressings otherwise as well. Most recently has been using triple antibiotic ointment. With that being said unfortunately despite everything he really has not had any significant improvement. He tells me that he cannot even really remember exactly how this began but he presumed it may have rubbed on his shoes or something of that nature. 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 He tells me that this does seem to help. Fortunately there is no signs of active infection at this time. His most recent hemoglobin A1c was 8.1 though he knows this was this year he cannot tell me the exact time. His fluid pills currently to help with some of the lower extremity edema although he does obviously have signs of venous stasis/lymphedema. Currently there is no evidence of active infection. No fevers, chills, nausea, vomiting, or diarrhea. Patient has had fairly recent ABIs which were performed on 07/19/2020 and revealed that he has normal findings in both the ankle and toe locations bilaterally. His ABI on the right was 1.09 on the left was 1.08 with a TBI on the right of 0.88 and on the left of 0.94. Triphasic flow was noted throughout. 10/08/2020 on evaluation today patient appears to be doing pretty well in regard to his left heel currently in fact this is doing a great job and seems to be healing quite nicely. Unfortunately on his right leg he had a pile of wood that actually fell on him injuring his right leg this is somewhat erythematous has me concerned little bit about cellulitis though there is not really a good area to culture at this point. 10/24/2020 upon evaluation today patient appears to be doing well with regard to his heel ulcer. He is showing signs of improvement which is great news. His right leg is completely healed. Overall I feel like he is doing excellent and there is no signs of infection. 11/07/2020 upon evaluation today patient appears to be doing well with regard to his heel ulcer. He tells me that last week when he was unable to come in his wife actually thought that the wound was very close to closing if not closed. Then it began to  "reopen again". I really feel like what may have happened as the collagen may have dried over the wound bed and that because  that misunderstanding with thinking that the wound was healing. With that being said I did not see it last week I do not know that for certain. Either way I feel like he is doing great today I see no signs of infection at this point. 11/26/2020 upon inspection today patient appears to be doing decently well in regard to his heel ulcer. He has been tolerating the dressing changes without complication. Fortunately there is no sign of active infection at this time. No fevers, chills, nausea, vomiting, or diarrhea. 12/10/2020 upon evaluation today patient appears to be doing fairly well in regard to the wound on his heel as well as what appears to be a new wound of the left first metatarsal head plantar aspect. This seems to be an area that was callus that has split as the patient tells me has been trying to walk on his toes more has probably where this came from. With that being said there does not appear to be signs of active infection which is great news. 12/17/2020 upon evaluation today patient actually appears to be making good progress currently. Fortunately there is no evidence of active infection at this time. Overall I feel like he is very close to complete closure. 12/26/2020 upon evaluation today patient appears to be doing excellent in regard to his wounds. In fact I am not certain that these are not even completely healed on initial inspection. Overall I am very pleased with where things stand today. Good news is that they are healed he is actually get ready to go out of town and that will be helpful as well as he will be a full part of the time. Readmission: 02/04/2021 upon evaluation today patient appears to be doing well at this point in regard to his left heel that I previously saw him for. Unfortunately he is having issues with his right lower extremity. He has  significant wounds at this point he also has erythema noted there is definite signs of cellulitis which is unfortunate. With that being said I think we do need to address this sooner rather than later. The good news is he did have a nice trip to the beach. He tells me that he had no issues during that time. 4/27; patient on Bactrim. Culture showed methicillin sensitive staph aureus therefore the Bactrim should be effective. 2 small areas on the right leg are healed the area on the mid aspect of the tibia almost 100% covered in a very adherent necrotic debris. We have been using silver alginate 02/20/2021 upon evaluation today patient appears to be doing well with regard to his wound. He is showing signs of improvement which is great news overall very pleased with where things stand today. No fevers, chills, nausea, vomiting, or diarrhea. 02/27/2021 upon evaluation today patient appears to be doing well with regard to his leg ulcer. He is tolerating the dressing changes and overall appears to be doing quite excellent. I am extremely pleased with where things stand and overall I think patient is making great progress. There is no sign of active infection at this time which is also great news. 03/06/2021 upon evaluation today patient appears to be doing excellent in regard to his wounds. In fact he appears to be completely healed today based on what I am seeing. This is excellent news and overall I am extremely pleased with where he stands. Overall the patient is happy to hear this as well this has been quite sometime coming. Readmission: 04/01/2021 patient unfortunately  returns for readmission today. He tells me that he has been wearing his compression socks on the right leg daily and he does not really know what is going on and why his legs are doing what they are doing. We did therefore go ahead and probe deeper into exactly what has been going on with him today. Subsequently the patient tells me that  when he gets up and what we would call "first thing in the morning" are really 2 different things". He does not tend to sleep well so he tells me that he will often wake up at 3:00 in the morning. He will then potentially going into the living room to get in his chair where he may read a book for a little while and then potentially fall asleep back in his chair. He then subsequently wake up around 5:00 or so and then get up and in his words "putter around". This often will end with him proceeding at some point around 8 AM or 9 AM to putting on his compression socks. With that being said this means that anywhere from the 3:00 in the morning till roughly around 9:00 in the morning he has no compression on yet he is sitting in his recliner, walking around and up and about, and this is at least 5 to 6 hours of noncompressed time. That may be our issue here but I did not realize until we discussed this further today. Fortunately there does not appear to be any signs of active infection at this time which is great news. With that being said he has multiple wounds of the bilateral lower extremities. YOUSSEF, FOOTMAN ([id number]) [id number] Page 8 of 11 04/10/2021 upon evaluation today patient appears to be doing well with regard to his legs for the most part. He does look like he had some injury where his wrap slid down nonetheless he did some "doctoring on them". I do feel like most of the areas honestly have cleared back up which is great news. There does not appear to be any signs of active infection at this time which is also great news. No fevers, chills, nausea, vomiting, or diarrhea. 04/18/2021 upon evaluation today patient appears to be doing well with regard to his wounds in fact he appears to be completely healed which is great news. Fortunately there does not appear to be any signs of active infection which is great news. No fevers, chills, nausea, vomiting, or  diarrhea. READMISSION 07/28/2021 This is a now 76 year old man we have had in this clinic for quite a bit of this year. 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 He wears compression stockings. 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 He comes in an old running shoes although he says he wears better shoes at home. His history is that he noticed a blister in the right posterior heel. This open. He has been applying Neosporin to it currently the wound measures 2 x 1.4 cm. 100% slough covered. Not really offloading this in any rigorous way. Past medical history is essentially unchanged he has paroxysmal atrial fibrillation on Coumadin type 2 diabetes with a recent hemoglobin A1c of 7 on metformin. ABI in our clinic was 0.98 on the right 08/05/2021 upon evaluation today patient appears to be doing well with regard to his heel ulcer. Fortunately there does not appear to be any signs of active infection at this time. Overall  I been very pleased with where things seem to be currently as far as the wound healing is concerned. Again this is first a lot of seeing him Dr. Dellia Nims readmitted him last week. Nonetheless I think we are definitely making some progress here. 08/11/2021 upon evaluation today patient's wound on the heel actually showing signs of excellent improvement. I am actually very pleased with where things stand currently. No fevers, chills, nausea, vomiting, or diarrhea. There does not appear to be any need for sharp debridement today either which is also great news. 08/25/2021 upon evaluation today patient appears to be doing well with regard to his heel ulcer. This is actually looking significantly improved compared to last time I saw him. Fortunately there does not appear to be any evidence of active infection at this time. No fevers, chills, nausea, vomiting, or  diarrhea. 09/01/2021 upon evaluation today patient appears to be doing decently well in regard to his wounds. Fortunately there does not appear to be any signs of active infection at this time which is great news and overall very pleased in that regard. I do not see any evidence of active infection systemically which is great news as well. No fevers, chills, nausea, vomiting, or diarrhea. I think that the heel is making excellent progress. 09/15/2021 upon evaluation today patient's heel unfortunately is significantly worse compared to what it was previous. I do believe that at this time he would benefit from switching back to something a little bit more offloading from the cushion shoe he has right now this is more of a heel protector what I really need is a Prevalon offloading boot which I think is good to do a lot better for him than just the small heel protector. 09/23/2021 upon evaluation today patient appears to be doing a little better in regards to last week's visit. Overall I think that he is making good progress which is great news and there does not appear to be any evidence of active infection at this time. No fevers, chills, nausea, vomiting, or diarrhea. 09/30/2021 upon evaluation patient's heel is actually showing signs of good improvement which is great news. Fortunately there does not appear to be any evidence of active infection locally nor systemically at this point. No fevers, chills, nausea, vomiting, or diarrhea. 10/07/2021 upon evaluation today patient appears to be doing well currently in regard to his heel ulcer. He has been tolerating the dressing changes without complication. Fortunately I do not see any signs of active infection at this time which is great news. No fevers, chills, nausea, vomiting, or diarrhea. 12/27; wound is measuring smaller. We have been using silver alginate 10/28/2021 upon evaluation today patient appears to be doing well currently in regard to his heel  ulcer. I am actually very pleased with where things stand and I think he is making excellent progress. Fortunately I do not see any signs of active infection locally nor systemically at this point which is great news. Nonetheless I do believe that the patient would benefit from a new offloading shoe and has been using it Velcro is no longer functioning properly and he tells me that he almost tripped and fell because of that today. Obviously I do not want him following that would be very bad. 11/11/2021 upon evaluation today patient appears to be doing excellent in regard to his wound. In fact this appears to be completely healed based on what I see currently. I do not see any signs of anything open or draining and  I did double check just by clearing away some of the callus around the edges of the wound to ensure that there was nothing still open or hiding underneath. Once this was cleared away it appeared that the patient was doing significantly better at this time which is great news and there was nothing actually open. Readmission: 06-19-2022 upon evaluation today patient appears to be doing well currently in regard to his heel ulcer all things considered. He unfortunately is having a bit of breakdown in general as far as the heel is concerned not nearly as bad as what we noted last time he was here in the clinic. The good news is I do think that this should hopefully heal much more rapidly than what we noted last time. His past medical history has not changed he is on Coumadin. 07-03-2022 upon evaluation today patient appears to be doing better in regard to his heel. We will start to see some improvement here which is good news. Fortunately I do not see any evidence of infection locally or systemically at this time which is great news. No fevers, chills, nausea, vomiting, or diarrhea. 07-10-2022 upon evaluation today patient appears to be doing well currently in regard to his wound from the callus  standpoint around the edges. Unfortunately from an actual wound bed standpoint he has some deep tissue injury noted at this point. Again I am not sure exactly what is going on that is causing this pressure to the region but he is definitely gotten pressure that is occurring and causing this to not heal as effectively as what we would like to see. I discussed with him today that he may need to at night when he sleeping use a pillow up underneath his calf in order to prevent the heel from touching the bed at all. I also think that it may be beneficial for him to monitor throughout his day and make sure there is no other time when he is getting the pressure to the heel. 07-23-2022 upon evaluation today patient appears to be doing poorly currently in regard to his heel ulcer. He has been tolerating the dressing changes without complication. Unfortunately I feel like he may be infected based on what I am seeing. 07-28-2022 I did review patient's culture results which showed positive for Proteus as well as Staphylococcus both of which would be treated with the Bactrim DS that I gave him at the last visit. This is good news and hopefully means that we should be able to apply the cast today as long as the wound itself does not appear to be doing poorly. Patient's wound bed actually showed signs of doing quite well and I am very pleased with where things stand and I do not see any signs of worsening overall which is great news. 08-04-2022 upon evaluation today patient appears to be doing well currently in regard to his heel ulcer. I am actually very pleased with where things stand and I do think this is looking significantly better. With regard to his right shin that is also showing signs of improvement which is great news. 08-10-2022 upon evaluation today patient's wound on the heel actually appears to be doing significantly better. In regard to the right anterior shin this is showing signs of healing it might  even be completely healed but I still get a monitor I cannot get anything to fill away but also could not see where there was anything actually draining at this point. I am tending to  think healed but I want a monitor 1 more week before I call it for sure. 08-17-2022 upon evaluation today patient appears to be doing well currently in regard to his heel. Fortunately he is tolerating the dressing changes without KWEKU, STANKEY ([id number]) [id number] Page 9 of 11 complication. I do not see any signs of infection and overall I think he is making good progress here. We did get approval for the Apligraf but I think he had a $295 co-pay that would have to be paid per application. With that being said as good as he is doing right now I do not think that is necessary but is still an option depending on how things progress if we need to speed this up quite a bit. 08/24/2022; this patient has a wound on his left heel in the setting of type 2 diabetes. We have been using Prisma. He has a heel offloading boot 08-31-2022 upon evaluation today patient appears to be doing poorly in regard to his heel ulcer which is still showing signs of bruising I am just not as happy as I was when I saw him 2 weeks ago with this. He saw Dr. Dellia Nims last week. Also did not look good at that point apparently. Nonetheless I think that the patient is still continuing to have some counterpressure getting to the wound bed unfortunately. 09-07-2022 upon evaluation today patient appears to be doing well currently in regard to his heel ulcer. He has been tolerating the dressing changes without complication. Fortunately there does not appear to be any signs of active infection locally or systemically at this time. Fortunately I do not see any evidence of active infection at this time. Objective Constitutional Well-nourished and well-hydrated in no acute distress. Vitals Time Taken: 1:36 PM, Height: 70 in,  Weight: 265 lbs, BMI: 38, Temperature: 99.2 F, Pulse: 65 bpm, Respiratory Rate: 18 breaths/min, Blood Pressure: 154/75 mmHg. Respiratory normal breathing without difficulty. Psychiatric this patient is able to make decisions and demonstrates good insight into disease process. Alert and Oriented x 3. pleasant and cooperative. General Notes: Patient's wound bed actually showed signs of good granulation and epithelization at this point. Unfortunately is having a lot more weeping from his leg and I think we need to do something to try to get this under control. We can actually put him in a compression wrap today which I think will be beneficial as well. Integumentary (Hair, Skin) Wound #10 status is Open. Original cause of wound was Gradually Appeared. The date acquired was: 06/05/2022. The wound has been in treatment 11 weeks. The wound is located on the Left Calcaneus. The wound measures 1.5cm length x 2.1cm width x 0.1cm depth; 2.474cm^2 area and 0.247cm^3 volume. There is Fat Layer (Subcutaneous Tissue) exposed. There is no tunneling or undermining noted. There is a medium amount of serosanguineous drainage noted. The wound margin is flat and intact. There is small (1-33%) pink granulation within the wound bed. There is a large (67-100%) amount of necrotic tissue within the wound bed including Adherent Slough. Wound #13 status is Open. Original cause of wound was Trauma. The date acquired was: 08/28/2022. The wound has been in treatment 1 weeks. The wound is located on the Left,Anterior Lower Leg. The wound measures 1.1cm length x 0.9cm width x 0.1cm depth; 0.778cm^2 area and 0.078cm^3 volume. There is Fat Layer (Subcutaneous Tissue) exposed. There is no tunneling or undermining noted. There is a medium amount of serosanguineous drainage noted. The wound margin is flat and  intact. There is no granulation within the wound bed. There is a small (1-33%) amount of necrotic tissue within the wound bed  including Adherent Slough. 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 There was a Excisional Skin/Subcutaneous Tissue Debridement with a total area of 7.5 sq cm performed by Tommie Sams., PA-C. With the following instrument(s): Curette to remove Viable and Non-Viable tissue/material. Material removed includes Callus, Subcutaneous Tissue, and Slough. A time out was conducted at 13:57, prior to the start of the procedure. A Minimum amount of bleeding was controlled with Pressure. The procedure was tolerated well. Post Debridement Measurements: 1.5cm length x 2.1cm width x 0.1cm depth; 0.247cm^3 volume. Character of Wound/Ulcer Post Debridement is stable. Severity of Tissue Post Debridement is: Fat layer exposed. Post procedure Diagnosis Wound #10: Same as Pre-Procedure Wound #13 Pre-procedure diagnosis of Wound #13 is a Trauma, Other located on the Left,Anterior Lower Leg . There was a Three Layer Compression Therapy Procedure with a pre-treatment ABI of 1 by Massie Kluver. Post procedure Diagnosis Wound #13: Same as Pre-Procedure JAROME, TRULL ([id number]) [id number] Page 10 of 11 Plan Follow-up Appointments: Wound #10 Left Calcaneus: Return Appointment in 1 week. Bathing/ Shower/ Hygiene: May shower; gently cleanse wound with antibacterial soap, rinse and pat dry prior to dressing wounds Edema Control - Lymphedema / Segmental Compressive Device / Other: 3 Layer Compression System for Lymphedema. - left lower leg Non-Wound Condition: Additional non-wound orders/instructions: - Apply 40% Urea cream to  dry cracked skin on feet. Off-Loading: Open toe surgical shoe with peg assist. WOUND #10: - Calcaneus Wound Laterality: Left Prim Dressing: Prisma 4.34 (in) (Generic) 3 x Per Week/30 Days ary Discharge Instructions: Moisten w/normal saline or sterile water; Cover wound as directed. Do not remove from wound bed. Secondary Dressing: Zetuvit Plus 4x4 (in/in) 3 x Per Week/30 Days Com pression Wrap: 3-LAYER WRAP - Profore Lite LF 3 Multilayer Compression Bandaging System 3 x Per Week/30 Days Discharge Instructions: Apply 3 multi-layer wrap as prescribed. WOUND #13: - Lower Leg Wound Laterality: Left, Anterior Prim Dressing: Prisma 4.34 (in) (Generic) 3 x Per Week/30 Days ary Discharge Instructions: Moisten w/normal saline or sterile water; Cover wound as directed. Do not remove from wound bed. Secondary Dressing: Zetuvit Plus 4x4 (in/in) 3 x Per Week/30 Days Com pression Wrap: 3-LAYER WRAP - Profore Lite LF 3 Multilayer Compression Bandaging System 3 x Per Week/30 Days Discharge Instructions: Apply 3 multi-layer wrap as prescribed. 1. Based on what I am seeing I do believe that the patient would benefit from a continuation of therapy with the silver collagen which I think is doing a good job. 2. We are also going to continue Zetuvit over both areas on the leg as well as the heel. 3. Were also going to go ahead and initiate a 3 layer compression wrap to help with edema control. I think this should do a good job and my hope is that the patient will see signs of good improvement with the use of the compression wrapping. We will see patient back for reevaluation in 1 week here in the clinic. If anything worsens or changes patient will contact our office for additional recommendations. Electronic Signature(s) Signed:  09/07/2022 2:04:58 PM By: Worthy Keeler PA-C Entered By: Worthy Keeler on 09/07/2022  14:04:57 -------------------------------------------------------------------------------- SuperBill Details Patient Name: Date of Service: [name], GEO [name] J. 09/07/2022 Medical Record Number: [id number] Patient Account Number: [id number] Date of Birth/Sex: Treating RN: April 18, 1946 (76 y.o. 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 Facility Procedures YAMATO, KOPF ([id number]): CPT4 Code Description 32256720 11042 - DEB SUBQ TISSUE 20 SQ CM/< ICD-10 Diagnosis Description L97.522 Non-pressure chronic ulcer of other part of left foot with fat [id number] Page 11 of 11: Modifier Quantity 1 layer exposed Physician Procedures : CPT4 Code Description Modifier 9198022 17981 - WC PHYS SUBQ TISS 20 SQ CM ICD-10 Diagnosis Description 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sed Quantity: 1 Electronic Signature(s) Signed: 09/07/2022 2:10:58 PM By: Worthy Keeler PA-C Entered By: Worthy Keeler on 09/07/2022 14:10:57

## 2022-09-10 NOTE — Progress Notes (Signed)
[name] ([id number]) [id number] Page 1 of 11 Visit Report for 09/07/2022 Arrival Information Details Patient Name: Date of Service: [name], GEO [facility] J. 09/07/2022 1:30 PM Medical Record Number: [id number] Patient Account Number: [id number] Date of Birth/Sex: Treating RN: [birth year] (76 y.o. [name], [name] Primary Care Aryaa Bunting: Fulton Reek Other Clinician: Massie Kluver Referring Makeya Hilgert: Treating Jaisa Defino/Extender: [name] in Treatment: 11 Visit Information History Since Last Visit All ordered tests and consults were completed: No Patient Arrived: [name] Added or deleted any medications: No Arrival Time: 13:34 Any new allergies or adverse reactions: No Transfer Assistance: None Had a fall or experienced change in No Patient Requires Transmission-Based Precautions: No activities of daily living that may affect Patient Has Alerts: Yes risk of falls: Patient Alerts: Patient on Blood Thinner Signs or symptoms of abuse/neglect since last visito No Warfarin Hospitalized since last visit: No Type II Diabetic Implantable device outside of the clinic excluding No cellular tissue based products placed in the center since last visit: Pain Present Now: No Electronic Signature(s) Signed: 09/08/2022 5:00:42 PM By: Massie Kluver Entered By: Massie Kluver on 09/07/2022 13:36:05 -------------------------------------------------------------------------------- Clinic Level of Care Assessment Details Patient Name: Date of Service: [name] [facility] J. 09/07/2022 1:30 PM Medical Record Number: [id number] Patient Account Number: [id number] Date of Birth/Sex: Treating RN: 05-05-1946 (76 y.o. [name] Primary Care Caitlain Tweed: Fulton Reek Other Clinician: Massie Kluver Referring Jayren Cease: Treating Alahni Varone/Extender: [name] in Treatment: 11 Clinic Level of Care Assessment Items TOOL 1  Quantity Score _0  - 0 Use when EandM and Procedure is performed on INITIAL visit ASSESSMENTS - Nursing Assessment / Reassessment _1  - 0 General Physical Exam (combine w/ comprehensive assessment (listed just below) when performed on new pt. evals) _2  - 0 Comprehensive Assessment (HX, ROS, Risk Assessments, Wounds Hx, etc.) FADIL, MACMASTER ([id number]) [id number] Page 2 of 11 ASSESSMENTS - Wound and Skin Assessment / Reassessment _3  - 0 Dermatologic / Skin Assessment (not related to wound area) ASSESSMENTS - Ostomy and/or Continence Assessment and Care _4  - 0 Incontinence Assessment and Management _5  - 0 Ostomy Care Assessment and Management (repouching, etc.) PROCESS - Coordination of Care _6  - 0 Simple Patient / Family Education for ongoing care _7  - 0 Complex (extensive) Patient / Family Education for ongoing care _8  - 0 Staff obtains Programmer, systems, Records, T Results / Process Orders est _9  - 0 Staff telephones HHA, Nursing Homes / Clarify orders / etc _10  - 0 Routine Transfer to another Facility (non-emergent condition) _11  - 0 Routine Hospital Admission (non-emergent condition) _12  - 0 New Admissions / Biomedical engineer / Ordering NPWT Apligraf, etc. , _13  - 0 Emergency Hospital Admission (emergent condition) PROCESS - Special Needs _14  - 0 Pediatric / Minor Patient Management _15  - 0 Isolation Patient Management _16  - 0 Hearing / Language / Visual special needs _17  - 0 Assessment of Community assistance (transportation, D/C planning, etc.) _18  - 0 Additional assistance / Altered mentation _19  - 0 Support Surface(s) Assessment (bed, cushion, seat, etc.) INTERVENTIONS - Miscellaneous _20  - 0 External ear exam _21  - 0 Patient Transfer (multiple staff / Civil Service fast streamer / Similar devices) _22  - 0 Simple Staple / Suture removal (25 or less) _23  - 0 Complex Staple / Suture removal (26 or more) _24  - 0 Hypo/Hyperglycemic Management (do not check if  billed separately) _25  - 0 Ankle / Brachial Index (ABI) - do not check if billed separately Has the patient been seen at the hospital  within the last three years: Yes Total Score: 0 Level Of Care: ____ Electronic Signature(s) Signed: 09/08/2022 5:00:42 PM By: Massie Kluver Entered By: Massie Kluver on 09/07/2022 14:02:47 -------------------------------------------------------------------------------- Compression Therapy Details Patient Name: Date of Service: [name], [name] 09/07/2022 1:30 PM Medical Record Number: [id number] Patient Account Number: [id number] Date of Birth/Sex: Treating RN: 12-01-1945 (76 y.o. [name] Primary Care Tierria Watson: Fulton Reek Other Clinician: Massie Kluver Referring Azaliah Carrero: Treating Lyndell Allaire/Extender: [name] in Treatment: [address] ([id number]) [id number] Page 3 of 11 Compression Therapy Performed for Wound Assessment: Wound #13 Left,Anterior Lower Leg Performed By: Lenice Pressman, Angie, Compression Type: Three Layer Pre Treatment ABI: 1 Post Procedure Diagnosis Same as Pre-procedure Electronic Signature(s) Signed: 09/08/2022 5:00:42 PM By: Massie Kluver Entered By: Massie Kluver on 09/07/2022 14:02:26 -------------------------------------------------------------------------------- Encounter Discharge Information Details Patient Name: Date of Service: [name], [name] 09/07/2022 1:30 PM Medical Record Number: [id number] Patient Account Number: [id number] Date of Birth/Sex: Treating RN: 1945-11-24 (76 y.o. [name] Primary Care Trevonne Nyland: Fulton Reek Other Clinician: Massie Kluver Referring Rai Sinagra: Treating Nissan Frazzini/Extender: [name] in Treatment: 11 Encounter Discharge Information Items Post Procedure Vitals Discharge Condition: Stable Temperature (F): 99.2 Ambulatory Status: Cane Pulse (bpm): 65 Discharge  Destination: Home Respiratory Rate (breaths/min): 18 Transportation: Private Auto Blood Pressure (mmHg): 154/75 Accompanied By: self Schedule Follow-up Appointment: Yes Clinical Summary of Care: Electronic Signature(s) Signed: 09/08/2022 5:00:42 PM By: Massie Kluver Entered By: Massie Kluver on 09/07/2022 16:10:15 -------------------------------------------------------------------------------- Lower Extremity Assessment Details Patient Name: Date of Service: [name], [name] 09/07/2022 1:30 PM Medical Record Number: [id number] Patient Account Number: [id number] Date of Birth/Sex: Treating RN: September 28, 1946 (76 y.o. [name] Primary Care Valdemar Mcclenahan: Fulton Reek Other Clinician: Massie Kluver Referring Marlow Hendrie: Treating Fredrick Dray/Extender: Maurilio Lovely Weeks in Treatment: 11 Edema Assessment F[Left: ADANTE, COURINGTON ([id number])] [Right: [id number] Page 4 of 11] Assessed: [Left: Yes] [Right: No] Edema: [Left: Ye] [Right: s] Calf Left: Right: Point of Measurement: 33 cm From Medial Instep 43 cm Ankle Left: Right: Point of Measurement: 11 cm From Medial Instep 25.5 cm Vascular Assessment Pulses: Dorsalis Pedis Palpable: [Left:Yes] Electronic Signature(s) Signed: 09/08/2022 5:00:42 PM By: Massie Kluver Signed: 09/09/2022 5:38:08 PM By: [name], BSN, RN, CWS, [name] RN, BSN Entered By: Massie Kluver on 09/07/2022 13:50:27 -------------------------------------------------------------------------------- Multi Wound Chart Details Patient Name: Date of Service: [name], [name] 09/07/2022 1:30 PM Medical Record Number: [id number] Patient Account Number: [id number] Date of Birth/Sex: Treating RN: 1945-11-24 (76 y.o. [name], [name] Primary Care Levelle Edelen: Fulton Reek Other Clinician: Massie Kluver Referring Rosina Cressler: Treating Urvi Imes/Extender: Maurilio Lovely Weeks in Treatment: 11 Vital Signs Height(in):  70 Pulse(bpm): 17 Weight(lbs): 265 Blood Pressure(mmHg): 154/75 Body Mass Index(BMI): 38 Temperature(F): 99.2 Respiratory Rate(breaths/min): 18 [10:Photos:] [N/A:N/A] Left Calcaneus Left, Anterior Lower Leg N/A Wound Location: Gradually Appeared Trauma N/A Wounding Event: Diabetic Wound/Ulcer of the Lower Trauma, Other N/A Primary Etiology: Extremity Pressure Ulcer N/A N/A Secondary Etiology: Cataracts, Arrhythmia, Coronary Cataracts, Arrhythmia, Coronary N/A Comorbid History: Artery Disease, Hypertension, Type II Artery Disease, Hypertension, Type II Diabetes, Osteoarthritis, Neuropathy Diabetes, Osteoarthritis, Neuropathy 06/05/2022 08/28/2022 N/A Date Acquired: 60 1 N/A Weeks of Treatment: Open Open N/A Wound Status: TAVIUS, TURGEON ([id number]) [id number] Page 5 of 11 No No N/A Wound Recurrence: 1.5x2.1x0.1 1.1x0.9x0.1 N/A Measurements L x W x D (cm) 2.474 0.778 N/A A (cm) : rea 0.247 0.078 N/A Volume (cm) : -75.00% 66.90% N/A % Reduction in Area:  12.70% 66.80% N/A % Reduction in Volume: Grade 1 Full Thickness Without Exposed N/A Classification: Support Structures Medium Medium N/A Exudate Amount: Serosanguineous Serosanguineous N/A Exudate Type: red, brown red, brown N/A Exudate Color: Flat and Intact Flat and Intact N/A Wound Margin: Small (1-33%) None Present (0%) N/A Granulation Amount: Pink N/A N/A Granulation Quality: Large (67-100%) Small (1-33%) N/A Necrotic Amount: Fat Layer (Subcutaneous Tissue): Yes Fat Layer (Subcutaneous Tissue): Yes N/A Exposed Structures: Fascia: No Fascia: No Tendon: No Tendon: No Muscle: No Muscle: No Joint: No Joint: No Bone: No Bone: No Small (1-33%) None N/A Epithelialization: Treatment Notes Electronic Signature(s) Signed: 09/08/2022 5:00:42 PM By: Massie Kluver Entered By: Massie Kluver on 09/07/2022  13:50:47 -------------------------------------------------------------------------------- Multi-Disciplinary Care Plan Details Patient Name: Date of Service: [name], [name] 09/07/2022 1:30 PM Medical Record Number: [id number] Patient Account Number: [id number] Date of Birth/Sex: Treating RN: [birth year] (76 y.o. [name] Primary Care Adya Wirz: Fulton Reek Other Clinician: Massie Kluver Referring Nocole Zammit: Treating Stepahnie Campo/Extender: [name] in Treatment: 11 Active Inactive Pressure Nursing Diagnoses: Knowledge deficit related to causes and risk factors for pressure ulcer development Knowledge deficit related to management of pressures ulcers Potential for impaired tissue integrity related to pressure, friction, moisture, and shear Goals: Patient will remain free from development of additional pressure ulcers Date Initiated: 06/19/2022 Date Inactivated: 07/28/2022 Target Resolution Date: 06/19/2022 Goal Status: Met Patient/caregiver will verbalize risk factors for pressure ulcer development Date Initiated: 06/19/2022 Date Inactivated: 07/28/2022 Target Resolution Date: 06/19/2022 Goal Status: Met Patient/caregiver will verbalize understanding of pressure ulcer management Date Initiated: 06/19/2022 Target Resolution Date: 08/14/2022 Goal Status: Active Interventions: Assess: immobility, friction, shearing, incontinence upon admission and as needed Assess offloading mechanisms upon admission and as needed KARSON, REEDE ([id number]) [id number] Page 6 of 11 Assess potential for pressure ulcer upon admission and as needed Provide education on pressure ulcers Notes: Wound/Skin Impairment Nursing Diagnoses: Impaired tissue integrity Goals: Patient/caregiver will verbalize understanding of skin care regimen Date Initiated: 06/19/2022 Date Inactivated: 07/10/2022 Target Resolution Date: 06/19/2022 Goal Status:  Met Ulcer/skin breakdown will have a volume reduction of 30% by week 4 Date Initiated: 06/19/2022 Date Inactivated: 07/28/2022 Target Resolution Date: 07/17/2022 Goal Status: Unmet Unmet Reason: infection Ulcer/skin breakdown will have a volume reduction of 50% by week 8 Date Initiated: 07/28/2022 Target Resolution Date: 08/14/2022 Goal Status: Active Interventions: Assess patient/caregiver ability to obtain necessary supplies Assess patient/caregiver ability to perform ulcer/skin care regimen upon admission and as needed Assess ulceration(s) every visit Provide education on ulcer and skin care Treatment Activities: Skin care regimen initiated : 06/19/2022 Topical wound management initiated : 06/19/2022 Notes: Electronic Signature(s) Signed: 09/08/2022 5:00:42 PM By: Massie Kluver Signed: 09/09/2022 5:38:08 PM By: [name], BSN, RN, CWS, [name] RN, BSN Entered By: Massie Kluver on 09/07/2022 13:50:38 -------------------------------------------------------------------------------- Pain Assessment Details Patient Name: Date of Service: [name], [name] 09/07/2022 1:30 PM Medical Record Number: [id number] Patient Account Number: [id number] Date of Birth/Sex: Treating RN: 1945-12-09 (76 y.o. [name] Primary Care Favio Moder: Fulton Reek Other Clinician: Massie Kluver Referring Emit Kuenzel: Treating Justen Fonda/Extender: Maurilio Lovely Weeks in Treatment: 11 Active Problems Location of Pain Severity and Description of Pain Patient Has Paino No Site Locations KHALIFA, KNECHT ([id number]) [id number] Page 7 of 11 Pain Management and Medication Current Pain Management: Electronic Signature(s) Signed: 09/07/2022 1:46:28 PM By: [name], BSN, RN, CWS, [name] RN, BSN Signed: 09/08/2022 5:00:42 PM By: Massie Kluver Entered By: Massie Kluver on 09/07/2022  13:38:53 -------------------------------------------------------------------------------- Patient/Caregiver Education Details  Patient Name: Date of Service: [name] [facility] 11/20/2023andnbsp1:30 PM Medical Record Number: [id number] Patient Account Number: [id number] Date of Birth/Gender: Treating RN: [birth year] (77 y.o. [name] Primary Care Physician: Fulton Reek Other Clinician: Massie Kluver Referring Physician: Treating Physician/Extender: [name] in Treatment: 11 Education Assessment Education Provided To: Patient Education Topics Provided Wound/Skin Impairment: Handouts: Other: continue wound care as directed Methods: Explain/Verbal Responses: State content correctly Electronic Signature(s) Signed: 09/08/2022 5:00:42 PM By: Massie Kluver Entered By: Massie Kluver on 09/07/2022 14:03:23 [name] ([id number]) [id number] Page 8 of 11 -------------------------------------------------------------------------------- Wound Assessment Details Patient Name: Date of Service: [name], [name] 09/07/2022 1:30 PM Medical Record Number: [id number] Patient Account Number: [id number] Date of Birth/Sex: Treating RN: Sep 15, 1946 (76 y.o. [name], [name] Primary Care Ma Munoz: Fulton Reek Other Clinician: Massie Kluver Referring Ashleigh Luckow: Treating Vara Mairena/Extender: Maurilio Lovely Weeks in Treatment: 11 Wound Status Wound Number: 10 Primary Diabetic Wound/Ulcer of the Lower Extremity Etiology: Wound Location: Left Calcaneus Secondary Pressure Ulcer Wounding Event: Gradually Appeared Etiology: Date Acquired: 06/05/2022 Wound Open Weeks Of Treatment: 11 Status: Clustered Wound: No Comorbid Cataracts, Arrhythmia, Coronary Artery Disease, Hypertension, History: Type II Diabetes, Osteoarthritis, Neuropathy Photos Wound Measurements Length: (cm) 1.5 Width: (cm) 2.1 Depth: (cm) 0.1 Area:  (cm) 2.474 Volume: (cm) 0.247 % Reduction in Area: -75% % Reduction in Volume: 12.7% Epithelialization: Small (1-33%) Tunneling: No Undermining: No Wound Description Classification: Grade 1 Wound Margin: Flat and Intact Exudate Amount: Medium Exudate Type: Serosanguineous Exudate Color: red, brown Foul Odor After Cleansing: No Slough/Fibrino Yes Wound Bed Granulation Amount: Small (1-33%) Exposed Structure Granulation Quality: Pink Fascia Exposed: No Necrotic Amount: Large (67-100%) Fat Layer (Subcutaneous Tissue) Exposed: Yes Necrotic Quality: Adherent Slough Tendon Exposed: No Muscle Exposed: No Joint Exposed: No Bone Exposed: No Treatment Notes Wound #10 (Calcaneus) Wound Laterality: Left Cleanser Peri-Wound Care SAYF, KERNER ([id number]) [id number] Page 9 of 11 Topical Primary Dressing Prisma 4.34 (in) Discharge Instruction: Moisten w/normal saline or sterile water; Cover wound as directed. Do not remove from wound bed. Secondary Dressing Zetuvit Plus 4x4 (in/in) Secured With Compression Wrap 3-LAYER WRAP - Profore Lite LF 3 Multilayer Compression Bandaging System Discharge Instruction: Apply 3 multi-layer wrap as prescribed. Compression Stockings Add-Ons Electronic Signature(s) Signed: 09/08/2022 5:00:42 PM By: Massie Kluver Signed: 09/09/2022 5:38:08 PM By: [name], BSN, RN, CWS, [name] RN, BSN Entered By: Massie Kluver on 09/07/2022 13:48:32 -------------------------------------------------------------------------------- Wound Assessment Details Patient Name: Date of Service: [name], [name] 09/07/2022 1:30 PM Medical Record Number: [id number] Patient Account Number: [id number] Date of Birth/Sex: Treating RN: [birth year] (76 y.o. [name], [name] Primary Care Lavin Petteway: Fulton Reek Other Clinician: Massie Kluver Referring Odessia Asleson: Treating Shreena Baines/Extender: Maurilio Lovely Weeks in Treatment: 11 Wound  Status Wound Number: 13 Primary Trauma, Other Etiology: Wound Location: Left, Anterior Lower Leg Wound Open Wounding Event: Trauma Status: Date Acquired: 08/28/2022 Comorbid Cataracts, Arrhythmia, Coronary Artery Disease, Hypertension, Weeks Of Treatment: 1 History: Type II Diabetes, Osteoarthritis, Neuropathy Clustered Wound: No Photos Wound Measurements Length: (cm) 1.1 Width: (cm) 0.9 Depth: (cm) 0.1 Area: (cm) 0. Volume: (cm) 0. ALDRICH, LLOYD ([id number]) Wound Description Classification: Full Thickness Without Exposed Support Wound Margin: Flat and Intact Exudate Amount: Medium Exudate Type: Serosanguineous Exudate Color: red, brown Structures Foul Odor After Cleansing: Slough/Fibrino % Reduction in Area: 66.9% % Reduction in Volume: 66.8% Epithelialization: None 778 Tunneling: No 078 Undermining: No [id number] Page 10 of 11 No No Wound Bed Granulation Amount: None Present (  0%) Exposed Structure Necrotic Amount: Small (1-33%) Fascia Exposed: No Necrotic Quality: Adherent Slough Fat Layer (Subcutaneous Tissue) Exposed: Yes Tendon Exposed: No Muscle Exposed: No Joint Exposed: No Bone Exposed: No Treatment Notes Wound #13 (Lower Leg) Wound Laterality: Left, Anterior Cleanser Peri-Wound Care Topical Primary Dressing Prisma 4.34 (in) Discharge Instruction: Moisten w/normal saline or sterile water; Cover wound as directed. Do not remove from wound bed. Secondary Dressing Zetuvit Plus 4x4 (in/in) Secured With Compression Wrap 3-LAYER WRAP - Profore Lite LF 3 Multilayer Compression Bandaging System Discharge Instruction: Apply 3 multi-layer wrap as prescribed. Compression Stockings Add-Ons Electronic Signature(s) Signed: 09/08/2022 5:00:42 PM By: Massie Kluver Signed: 09/09/2022 5:38:08 PM By: [name], BSN, RN, CWS, [name] RN, BSN Entered By: Massie Kluver on 09/07/2022  13:49:32 -------------------------------------------------------------------------------- Vitals Details Patient Name: Date of Service: [name], [name] 09/07/2022 1:30 PM Medical Record Number: [id number] Patient Account Number: [id number] Date of Birth/Sex: Treating RN: 10-27-1945 (76 y.o. [name], [name] Primary Care Diandra Cimini: Fulton Reek Other Clinician: Massie Kluver Referring Raheem Kolbe: Treating Riannah Stagner/Extender: Maurilio Lovely Weeks in Treatment: 11 Vital Signs Time Taken: 13:36 Temperature (F): 99.2 Height (in): 70 Pulse (bpm): 65 Weight (lbs): 265 Respiratory Rate (breaths/min): 18 Body Mass Index (BMI): 38 Blood Pressure (mmHg): 154/75 TAVEN, STRITE ([id number]) [id number] Page 11 of 11 Reference Range: 80 - 120 mg / dl Electronic Signature(s) Signed: 09/08/2022 5:00:42 PM By: Massie Kluver Entered By: Massie Kluver on 09/07/2022 13:38:49

## 2022-09-15 ENCOUNTER — Encounter: Payer: Medicare HMO | Admitting: Physician Assistant

## 2022-09-15 DIAGNOSIS — E11621 Type 2 diabetes mellitus with foot ulcer: Secondary | ICD-10-CM | POA: Diagnosis not present

## 2022-09-15 NOTE — Progress Notes (Signed)
[name], [name] ([id number]) [id number] Page 1 of 2 Visit Report for 09/15/2022 Chief Complaint Document Details Patient Name: Date of Service: [name], GEO [facility] J. 09/15/2022 12:45 PM Medical Record Number: [id number] Patient Account Number: [id number] Date of Birth/Sex: Treating RN: 16-Oct-1946 (76 y.o. [name] Primary Care Provider: Fulton Reek Other Clinician: Massie Kluver Referring Provider: Treating Provider/Extender: Maurilio Lovely Weeks in Treatment: 12 Information Obtained from: Patient Chief Complaint Left heel ulcer Electronic Signature(s) Signed: 09/15/2022 1:02:41 PM By: Worthy Keeler PA-C Entered By: Worthy Keeler on 09/15/2022 13:02:41 -------------------------------------------------------------------------------- Problem List Details Patient Name: Date of Service: [name], [name] 09/15/2022 12:45 PM Medical Record Number: [id number] Patient Account Number: [id number] Date of Birth/Sex: Treating RN: [birth year] (76 y.o. [name] Primary Care Provider: Fulton Reek Other Clinician: Massie Kluver Referring Provider: Treating Provider/Extender: Maurilio Lovely Weeks in Treatment: 12 Active Problems ICD-10 Encounter Code Description Active Date MDM Diagnosis E11.621 Type 2 diabetes mellitus with foot ulcer 06/19/2022 No Yes L97.522 Non-pressure chronic ulcer of other part of left foot with fat 06/19/2022 No Yes layer exposed E11.42 Type 2 diabetes mellitus with diabetic polyneuropathy 06/19/2022 No Yes [name], [name] ([id number]) [id number] Page 2 of 2 Inactive Problems Resolved Problems Electronic Signature(s) Signed: 09/15/2022 1:02:38 PM By: Worthy Keeler PA-C Entered By: Worthy Keeler on 09/15/2022 13:02:38

## 2022-09-15 NOTE — Progress Notes (Signed)
[name], [name] ([id number]) [id number] Page 1 of 10 Visit Report for 09/15/2022 Arrival Information Details Patient Name: Date of Service: [name], GEO [facility] J. 09/15/2022 12:45 PM Medical Record Number: [id number] Patient Account Number: [id number] Date of Birth/Sex: Treating RN: [birth year] (76 y.o. [name] Primary Care Dermot Gremillion: Fulton Reek Other Clinician: Massie Kluver Referring Manya Balash: Treating Donzel Romack/Extender: [name] in Treatment: 12 Visit Information History Since Last Visit All ordered tests and consults were completed: No Patient Arrived: [name] Added or deleted any medications: No Arrival Time: 12:49 Any new allergies or adverse reactions: No Transfer Assistance: None Had a fall or experienced change in No Patient Identification Verified: Yes activities of daily living that may affect Secondary Verification Process Completed: Yes risk of falls: Patient Requires Transmission-Based Precautions: No Signs or symptoms of abuse/neglect since No Patient Has Alerts: Yes last visito Patient Alerts: Patient on Blood Thinner Hospitalized since last visit: No Warfarin Implantable device outside of the clinic No Type II Diabetic excluding cellular tissue based products placed in the center since last visit: Has Dressing in Place as Prescribed: Yes Has Footwear/Offloading in Place as Yes Prescribed: Left: Surgical Shoe with Pressure Relief Insole Pain Present Now: No Electronic Signature(s) Signed: 09/15/2022 5:05:44 PM By: Massie Kluver Entered By: Massie Kluver on 09/15/2022 12:54:28 -------------------------------------------------------------------------------- Clinic Level of Care Assessment Details Patient Name: Date of Service: [name] [facility] J. 09/15/2022 12:45 PM Medical Record Number: [id number] Patient Account Number: [id number] Date of Birth/Sex: Treating RN: 10/24/1945 (76 y.o. [name] Primary Care Sadaf Przybysz: Fulton Reek Other Clinician: Massie Kluver Referring Ermine Spofford: Treating Rhiley Tarver/Extender: [name] in Treatment: 12 Clinic Level of Care Assessment Items TOOL 1 Quantity Score JEURY, MCNAB ([id number]) [id number] Page 2 of 10 _0  - 0 Use when EandM and Procedure is performed on INITIAL visit ASSESSMENTS - Nursing Assessment / Reassessment _1  - 0 General Physical Exam (combine w/ comprehensive assessment (listed just below) when performed on new pt. evals) _2  - 0 Comprehensive Assessment (HX, ROS, Risk Assessments, Wounds Hx, etc.) ASSESSMENTS - Wound and Skin Assessment / Reassessment _3  - 0 Dermatologic / Skin Assessment (not related to wound area) ASSESSMENTS - Ostomy and/or Continence Assessment and Care _4  - 0 Incontinence Assessment and Management _5  - 0 Ostomy Care Assessment and Management (repouching, etc.) PROCESS - Coordination of Care _6  - 0 Simple Patient / Family Education for ongoing care _7  - 0 Complex (extensive) Patient / Family Education for ongoing care _8  - 0 Staff obtains Programmer, systems, Records, T Results / Process Orders est _9  - 0 Staff telephones HHA, Nursing Homes / Clarify orders / etc _10  - 0 Routine Transfer to another Facility (non-emergent condition) _11  - 0 Routine Hospital Admission (non-emergent condition) _12  - 0 New Admissions / Biomedical engineer / Ordering NPWT Apligraf, etc. , _13  - 0 Emergency Hospital Admission (emergent condition) PROCESS - Special Needs _14  - 0 Pediatric / Minor Patient Management _15  - 0 Isolation Patient Management _16  - 0 Hearing / Language / Visual special needs _17  - 0 Assessment of Community assistance (transportation, D/C planning, etc.) _18  - 0 Additional assistance / Altered mentation _19  - 0 Support Surface(s) Assessment (bed, cushion, seat, etc.) INTERVENTIONS - Miscellaneous _20  - 0 External ear exam _21  -  0 Patient Transfer (multiple staff / Civil Service fast streamer / Similar devices) _22  - 0 Simple Staple / Suture removal (25 or less) _23  - 0 Complex Staple / Suture removal (26 or more) _24  - 0 Hypo/Hyperglycemic  Management (do not check if billed separately) _0  - 0 Ankle / Brachial Index (ABI) - do not check if billed separately Has the patient been seen at the hospital within the last three years: Yes Total Score: 0 Level Of Care: ____ Electronic Signature(s) Signed: 09/15/2022 5:05:44 PM By: Massie Kluver Entered By: Massie Kluver on 09/15/2022 13:22:58 Encounter Discharge Information Details -------------------------------------------------------------------------------- [name] ([id number]) [id number] Page 3 of 10 Patient Name: Date of Service: [name], GEO [facility] J. 09/15/2022 12:45 PM Medical Record Number: [id number] Patient Account Number: [id number] Date of Birth/Sex: Treating RN: [birth year] (76 y.o. [name] Primary Care Donalda Job: Fulton Reek Other Clinician: Massie Kluver Referring Rhen Kawecki: Treating Tayton Decaire/Extender: [name] in Treatment: 12 Encounter Discharge Information Items Post Procedure Vitals Discharge Condition: Stable Temperature (F): 99.0 Ambulatory Status: Cane Pulse (bpm): 73 Discharge Destination: Home Respiratory Rate (breaths/min): 16 Transportation: Private Auto Blood Pressure (mmHg): 135/68 Accompanied By: self Schedule Follow-up Appointment: Yes Clinical Summary of Care: Electronic Signature(s) Signed: 09/15/2022 5:05:44 PM By: Massie Kluver Entered By: Massie Kluver on 09/15/2022 16:11:03 -------------------------------------------------------------------------------- Lower Extremity Assessment Details Patient Name: Date of Service: [name] [facility] J. 09/15/2022 12:45 PM Medical Record Number: [id number] Patient Account Number: [id number] Date of Birth/Sex: Treating  RN: 1946-04-20 (76 y.o. [name] Primary Care Bobbijo Holst: Fulton Reek Other Clinician: Massie Kluver Referring Lailanie Hasley: Treating Manessa Buley/Extender: Maurilio Lovely Weeks in Treatment: 12 Edema Assessment Assessed: Shirlyn Goltz: Yes] Patrice Paradise: No] Edema: [Left: Ye] [Right: s] Calf Left: Right: Point of Measurement: 33 cm From Medial Instep 39.3 cm Ankle Left: Right: Point of Measurement: 11 cm From Medial Instep 24.3 cm Vascular Assessment Pulses: Dorsalis Pedis Palpable: [Left:Yes] Electronic Signature(s) Signed: 09/15/2022 1:12:02 PM By: [name], BSN, RN, CWS, [name] RN, BSN Signed: 09/15/2022 5:05:44 PM By: Massie Kluver Entered By: Massie Kluver on 09/15/2022 13:08:52 [name] ([id number]) [id number] Page 4 of 10 -------------------------------------------------------------------------------- Multi Wound Chart Details Patient Name: Date of Service: [name], GEO [facility] J. 09/15/2022 12:45 PM Medical Record Number: [id number] Patient Account Number: [id number] Date of Birth/Sex: Treating RN: [birth year] (76 y.o. [name], [name] Primary Care Avien Taha: Fulton Reek Other Clinician: Massie Kluver Referring Sheralee Qazi: Treating Olimpia Tinch/Extender: Maurilio Lovely Weeks in Treatment: 12 Vital Signs Height(in): 53 Pulse(bpm): 49 Weight(lbs): 5 Blood Pressure(mmHg): 135/68 Body Mass Index(BMI): 38 Temperature(F): 99.0 Respiratory Rate(breaths/min): 18 [10:Photos:] [N/A:N/A] Left Calcaneus Left, Anterior Lower Leg N/A Wound Location: Gradually Appeared Trauma N/A Wounding Event: Diabetic Wound/Ulcer of the Lower Trauma, Other N/A Primary Etiology: Extremity Pressure Ulcer N/A N/A Secondary Etiology: Cataracts, Arrhythmia, Coronary Cataracts, Arrhythmia, Coronary N/A Comorbid History: Artery Disease, Hypertension, Type IIArtery Disease, Hypertension, Type II Diabetes, Osteoarthritis, Neuropathy Diabetes,  Osteoarthritis, Neuropathy 06/05/2022 08/28/2022 N/A Date Acquired: 12 2 N/A Weeks of Treatment: Open Open N/A Wound Status: No No N/A Wound Recurrence: 1.6x2.5x0.1 0x0x0 N/A Measurements L x W x D (cm) 3.142 0 N/A A (cm) : rea 0.314 0 N/A Volume (cm) : -122.20% 100.00% N/A % Reduction in Area: -11.00% 100.00% N/A % Reduction in Volume: Grade 1 Full Thickness Without Exposed N/A Classification: Support Structures Medium None Present N/A Exudate Amount: Serosanguineous N/A N/A Exudate Type: red, brown N/A N/A Exudate Color: Flat and Intact Flat and Intact N/A Wound Margin: Small (1-33%) None Present (0%) N/A Granulation Amount: Pink N/A N/A Granulation Quality: Large (67-100%) None Present (0%) N/A Necrotic Amount: Fat Layer (Subcutaneous Tissue): Yes Fascia: No N/A Exposed Structures: Fascia: No Fat Layer (Subcutaneous Tissue): No Tendon: No Tendon: No  Muscle: No Muscle: No Joint: No Joint: No Bone: No Bone: No Small (1-33%) Large (67-100%) N/A Epithelialization: Treatment Notes Electronic Signature(s) Signed: 09/15/2022 5:05:44 PM By: Karolee Ohs ([id number]) [id number] Page 5 of 10 Entered By: Massie Kluver on 09/15/2022 13:09:13 -------------------------------------------------------------------------------- Multi-Disciplinary Care Plan Details Patient Name: Date of Service: [name] [facility] J. 09/15/2022 12:45 PM Medical Record Number: [id number] Patient Account Number: [id number] Date of Birth/Sex: Treating RN: [birth year] (76 y.o. [name], [name] Primary Care Akshith Moncus: Fulton Reek Other Clinician: Massie Kluver Referring Oron Westrup: Treating Tangee Marszalek/Extender: [name] in Treatment: 12 Active Inactive Pressure Nursing Diagnoses: Knowledge deficit related to causes and risk factors for pressure ulcer development Knowledge deficit related to management of pressures  ulcers Potential for impaired tissue integrity related to pressure, friction, moisture, and shear Goals: Patient will remain free from development of additional pressure ulcers Date Initiated: 06/19/2022 Date Inactivated: 07/28/2022 Target Resolution Date: 06/19/2022 Goal Status: Met Patient/caregiver will verbalize risk factors for pressure ulcer development Date Initiated: 06/19/2022 Date Inactivated: 07/28/2022 Target Resolution Date: 06/19/2022 Goal Status: Met Patient/caregiver will verbalize understanding of pressure ulcer management Date Initiated: 06/19/2022 Target Resolution Date: 08/14/2022 Goal Status: Active Interventions: Assess: immobility, friction, shearing, incontinence upon admission and as needed Assess offloading mechanisms upon admission and as needed Assess potential for pressure ulcer upon admission and as needed Provide education on pressure ulcers Notes: Wound/Skin Impairment Nursing Diagnoses: Impaired tissue integrity Goals: Patient/caregiver will verbalize understanding of skin care regimen Date Initiated: 06/19/2022 Date Inactivated: 07/10/2022 Target Resolution Date: 06/19/2022 Goal Status: Met Ulcer/skin breakdown will have a volume reduction of 30% by week 4 Date Initiated: 06/19/2022 Date Inactivated: 07/28/2022 Target Resolution Date: 07/17/2022 Goal Status: Unmet Unmet Reason: infection Ulcer/skin breakdown will have a volume reduction of 50% by week 8 Date Initiated: 07/28/2022 Target Resolution Date: 08/14/2022 Goal Status: Active Interventions: Assess patient/caregiver ability to obtain necessary supplies Assess patient/caregiver ability to perform ulcer/skin care regimen upon admission and as needed Assess ulceration(s) every visit Provide education on ulcer and skin care IKER, NUTTALL ([id number]) [id number] Page 6 of 10 Treatment Activities: Skin care regimen initiated : 06/19/2022 Topical wound management initiated  : 06/19/2022 Notes: Electronic Signature(s) Signed: 09/15/2022 1:12:02 PM By: [name], BSN, RN, CWS, [name] RN, BSN Signed: 09/15/2022 5:05:44 PM By: Massie Kluver Entered By: Massie Kluver on 09/15/2022 13:09:03 -------------------------------------------------------------------------------- Pain Assessment Details Patient Name: Date of Service: [name], [name] 09/15/2022 12:45 PM Medical Record Number: [id number] Patient Account Number: [id number] Date of Birth/Sex: Treating RN: Aug 21, 1946 (76 y.o. [name] Primary Care Jomes Giraldo: Fulton Reek Other Clinician: Massie Kluver Referring Antoneo Ghrist: Treating Katheen Aslin/Extender: Maurilio Lovely Weeks in Treatment: 12 Active Problems Location of Pain Severity and Description of Pain Patient Has Paino No Site Locations Pain Management and Medication Current Pain Management: Electronic Signature(s) Signed: 09/15/2022 1:12:02 PM By: [name], BSN, RN, CWS, [name] RN, BSN Signed: 09/15/2022 5:05:44 PM By: Massie Kluver Entered By: Massie Kluver on 09/15/2022 12:56:52 [name] ([id number]) [id number] Page 7 of 10 -------------------------------------------------------------------------------- Patient/Caregiver Education Details Patient Name: Date of Service: [name] [facility] 11/28/2023andnbsp12:45 PM Medical Record Number: [id number] Patient Account Number: [id number] Date of Birth/Gender: Treating RN: April 27, 1946 (76 y.o. [name] Primary Care Physician: Fulton Reek Other Clinician: Massie Kluver Referring Physician: Treating Physician/Extender: [name] in Treatment: 12 Education Assessment Education Provided To: Patient Education Topics Provided Infection: Handouts: Infection Prevention and Management, Other: start Bactrim by mouth and Gentamycin topical  Methods: Explain/Verbal Responses: State content correctly Electronic  Signature(s) Signed: 09/15/2022 5:05:44 PM By: Massie Kluver Entered By: Massie Kluver on 09/15/2022 13:23:43 -------------------------------------------------------------------------------- Wound Assessment Details Patient Name: Date of Service: [name], [name] 09/15/2022 12:45 PM Medical Record Number: [id number] Patient Account Number: [id number] Date of Birth/Sex: Treating RN: 10/30/1945 (76 y.o. [name] Primary Care Brandalyn Harting: Fulton Reek Other Clinician: Massie Kluver Referring Briteny Fulghum: Treating Tam Delisle/Extender: Maurilio Lovely Weeks in Treatment: 12 Wound Status Wound Number: 10 Primary Diabetic Wound/Ulcer of the Lower Extremity Etiology: Wound Location: Left Calcaneus Secondary Pressure Ulcer Wounding Event: Gradually Appeared Etiology: Date Acquired: 06/05/2022 Wound Open Weeks Of Treatment: 12 Status: Clustered Wound: No Comorbid Cataracts, Arrhythmia, Coronary Artery Disease, Hypertension, History: Type II Diabetes, Osteoarthritis, Neuropathy Photos RUVEN, CORRADI ([id number]) [id number] Page 8 of 10 Wound Measurements Length: (cm) 1.6 Width: (cm) 2.5 Depth: (cm) 0.1 Area: (cm) 3.142 Volume: (cm) 0.314 % Reduction in Area: -122.2% % Reduction in Volume: -11% Epithelialization: Small (1-33%) Tunneling: No Undermining: No Wound Description Classification: Grade 1 Wound Margin: Flat and Intact Exudate Amount: Medium Exudate Type: Serosanguineous Exudate Color: red, brown Foul Odor After Cleansing: No Slough/Fibrino Yes Wound Bed Granulation Amount: Small (1-33%) Exposed Structure Granulation Quality: Pink Fascia Exposed: No Necrotic Amount: Large (67-100%) Fat Layer (Subcutaneous Tissue) Exposed: Yes Necrotic Quality: Adherent Slough Tendon Exposed: No Muscle Exposed: No Joint Exposed: No Bone Exposed: No Treatment Notes Wound #10 (Calcaneus) Wound Laterality: Left Cleanser Peri-Wound  Care Topical Gentamicin Discharge Instruction: Apply as directed by Rayette Mogg. Primary Dressing Prisma 4.34 (in) Discharge Instruction: Moisten w/normal saline or sterile water; Cover wound as directed. Do not remove from wound bed. Secondary Dressing Zetuvit Plus 4x4 (in/in) Secured With Compression Wrap 3-LAYER WRAP - Profore Lite LF 3 Multilayer Compression Bandaging System Discharge Instruction: Apply 3 multi-layer wrap as prescribed. Compression Stockings Add-Ons Electronic Signature(s) Signed: 09/15/2022 1:12:02 PM By: [name], BSN, RN, CWS, [name] RN, BSN Signed: 09/15/2022 5:05:44 PM By: Massie Kluver Entered By: Massie Kluver on 09/15/2022 13:05:06 [name] ([id number]) [id number] Page 9 of 10 -------------------------------------------------------------------------------- Wound Assessment Details Patient Name: Date of Service: [name], GEO [facility] J. 09/15/2022 12:45 PM Medical Record Number: [id number] Patient Account Number: [id number] Date of Birth/Sex: Treating RN: 08/31/1946 (76 y.o. [name], [name] Primary Care Marrion Accomando: Fulton Reek Other Clinician: Massie Kluver Referring Carlisle Enke: Treating Chinmayi Rumer/Extender: Maurilio Lovely Weeks in Treatment: 12 Wound Status Wound Number: 13 Primary Trauma, Other Etiology: Wound Location: Left, Anterior Lower Leg Wound Open Wounding Event: Trauma Status: Date Acquired: 08/28/2022 Comorbid Cataracts, Arrhythmia, Coronary Artery Disease, Hypertension, Weeks Of Treatment: 2 History: Type II Diabetes, Osteoarthritis, Neuropathy Clustered Wound: No Photos Wound Measurements Length: (cm) Width: (cm) Depth: (cm) Area: (cm) Volume: (cm) 0 % Reduction in Area: 100% 0 % Reduction in Volume: 100% 0 Epithelialization: Large (67-100%) 0 Tunneling: No 0 Undermining: No Wound Description Classification: Full Thickness Without Exposed Support Structures Wound Margin: Flat and  Intact Exudate Amount: None Present Foul Odor After Cleansing: No Slough/Fibrino No Wound Bed Granulation Amount: None Present (0%) Exposed Structure Necrotic Amount: None Present (0%) Fascia Exposed: No Fat Layer (Subcutaneous Tissue) Exposed: No Tendon Exposed: No Muscle Exposed: No Joint Exposed: No Bone Exposed: No Electronic Signature(s) Signed: 09/15/2022 1:12:02 PM By: [name], BSN, RN, CWS, [name] RN, BSN Signed: 09/15/2022 5:05:44 PM By: Massie Kluver Entered By: Massie Kluver on 09/15/2022 13:05:46 [name] ([id number]) [id number] Page 10 of 10 -------------------------------------------------------------------------------- Vitals Details Patient Name: Date of Service: [name], GEO  [name] 09/15/2022 12:45 PM Medical Record Number: [id number] Patient Account Number: [id number] Date of Birth/Sex: Treating RN: September 18, 1946 (76 y.o. [name], [name] Primary Care Keven Osborn: Fulton Reek Other Clinician: Massie Kluver Referring Shevawn Langenberg: Treating Myrna Vonseggern/Extender: Maurilio Lovely Weeks in Treatment: 12 Vital Signs Time Taken: 12:54 Temperature (F): 99.0 Height (in): 70 Pulse (bpm): 73 Weight (lbs): 265 Respiratory Rate (breaths/min): 18 Body Mass Index (BMI): 38 Blood Pressure (mmHg): 135/68 Reference Range: 80 - 120 mg / dl Electronic Signature(s) Signed: 09/15/2022 5:05:44 PM By: Massie Kluver Entered By: Massie Kluver on 09/15/2022 56:81:27

## 2022-09-21 ENCOUNTER — Encounter: Payer: Medicare HMO | Attending: Physician Assistant | Admitting: Physician Assistant

## 2022-09-21 DIAGNOSIS — L97522 Non-pressure chronic ulcer of other part of left foot with fat layer exposed: Secondary | ICD-10-CM | POA: Diagnosis not present

## 2022-09-21 DIAGNOSIS — I872 Venous insufficiency (chronic) (peripheral): Secondary | ICD-10-CM | POA: Insufficient documentation

## 2022-09-21 DIAGNOSIS — M1990 Unspecified osteoarthritis, unspecified site: Secondary | ICD-10-CM | POA: Insufficient documentation

## 2022-09-21 DIAGNOSIS — I480 Paroxysmal atrial fibrillation: Secondary | ICD-10-CM | POA: Diagnosis not present

## 2022-09-21 DIAGNOSIS — I10 Essential (primary) hypertension: Secondary | ICD-10-CM | POA: Insufficient documentation

## 2022-09-21 DIAGNOSIS — Z7901 Long term (current) use of anticoagulants: Secondary | ICD-10-CM | POA: Insufficient documentation

## 2022-09-21 DIAGNOSIS — E11621 Type 2 diabetes mellitus with foot ulcer: Secondary | ICD-10-CM | POA: Diagnosis present

## 2022-09-21 DIAGNOSIS — E1142 Type 2 diabetes mellitus with diabetic polyneuropathy: Secondary | ICD-10-CM | POA: Diagnosis not present

## 2022-09-21 NOTE — Progress Notes (Addendum)
DERRECK, WILTSEY ([id number]) [id number] Page 1 of 11 Visit Report for 09/21/2022 Chief Complaint Document Details Patient Name: Date of Service: [name], [name] [facility] J. 09/21/2022 1:30 PM Medical Record Number: [id number] Patient Account Number: [id number] Date of Birth/Sex: Treating RN: [birth year] (76 y.o. [name] Primary Care Provider: Fulton Reek Other Clinician: Massie Kluver Referring Provider: Treating Provider/Extender: Maurilio Lovely Weeks in Treatment: 13 Information Obtained from: Patient Chief Complaint Left heel ulcer Electronic Signature(s) Signed: 09/21/2022 1:41:08 PM By: Worthy Keeler PA-C Entered By: Worthy Keeler on 09/21/2022 13:41:08 -------------------------------------------------------------------------------- Debridement Details Patient Name: Date of Service: [name], [name] [name] 09/21/2022 1:30 PM Medical Record Number: [id number] Patient Account Number: [id number] Date of Birth/Sex: Treating RN: [birth year] (76 y.o. [name] Primary Care Provider: Fulton Reek Other Clinician: Massie Kluver Referring Provider: Treating Provider/Extender: [name] in Treatment: 13 Debridement Performed for Assessment: Wound #10 Left Calcaneus Performed By: Physician Tommie Sams., PA-C Debridement Type: Debridement Severity of Tissue Pre Debridement: Fat layer exposed Level of Consciousness (Pre-procedure): Awake and Alert Pre-procedure Verification/Time Out Yes - 14:12 Taken: Start Time: 14:12 T Area Debrided (L x W): otal 1.4 (cm) x 2.5 (cm) = 3.5 (cm) Tissue and other material debrided: Viable, Non-Viable, Slough, Subcutaneous, Biofilm, Slough Level: Skin/Subcutaneous Tissue Debridement Description: Excisional Instrument: Curette Bleeding: Minimum Hemostasis Achieved: Pressure Response to Treatment: Procedure was tolerated well Level of Consciousness (Post- Awake and  Alert procedure): [name], [name] ([id number]) [id number] Page 2 of 11 Post Debridement Measurements of Total Wound Length: (cm) 1.4 Width: (cm) 2.5 Depth: (cm) 0.1 Volume: (cm) 0.275 Character of Wound/Ulcer Post Debridement: Stable Severity of Tissue Post Debridement: Fat layer exposed Post Procedure Diagnosis Same as Pre-procedure Electronic Signature(s) Signed: 09/21/2022 2:54:45 PM By: [name], BSN, RN, CWS, [name] RN, BSN Signed: 09/21/2022 4:48:10 PM By: Worthy Keeler PA-C Signed: 09/21/2022 4:50:46 PM By: Massie Kluver Entered By: Massie Kluver on 09/21/2022 14:15:56 -------------------------------------------------------------------------------- HPI Details Patient Name: Date of Service: [name], [name] [name] 09/21/2022 1:30 PM Medical Record Number: [id number] Patient Account Number: [id number] Date of Birth/Sex: Treating RN: 1946/02/10 (76 y.o. [name] Primary Care Provider: Fulton Reek Other Clinician: Massie Kluver Referring Provider: Treating Provider/Extender: Maurilio Lovely Weeks in Treatment: 13 History of Present Illness HPI Description: 09/24/2020 on evaluation today patient presents today for a heel ulcer that he tells me has been present for about 2 years. He has been seeing podiatry and they have been attempting to manage this including what sounds to be a total contact cast, Unna boot, and just standard dressings otherwise as well. Most recently has been using triple antibiotic ointment. With that being said unfortunately despite everything he really has not had any significant improvement. He tells me that he cannot even really remember exactly how this began but he presumed it may have rubbed on his shoes or something of that nature. 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 He tells me that this does seem to help. Fortunately there is no signs of active infection at this time. His most recent hemoglobin A1c was 8.1 though he knows this was this year he cannot tell me the exact time.  His fluid pills currently to help with some of the lower extremity edema although he does obviously have signs of venous stasis/lymphedema. Currently there is no evidence of active infection. No fevers, chills, nausea, vomiting, or diarrhea. Patient has had fairly recent ABIs which were performed on 07/19/2020 and revealed that he has normal findings in both the ankle and toe locations bilaterally. His ABI on the right was 1.09 on the left was 1.08 with a TBI on the right of 0.88 and on the left of 0.94. Triphasic flow was noted throughout. 10/08/2020 on evaluation today patient appears to be doing pretty well in regard to his left heel currently in fact this is doing a great job and seems to be healing quite nicely. Unfortunately on his right leg he had a pile of wood that actually fell on him injuring his right leg this is somewhat erythematous has me concerned little bit about cellulitis though there is not really a good area to culture at this point. 10/24/2020 upon evaluation today patient appears to be doing well with regard to his heel ulcer. He is showing signs of improvement which is great news. His right leg is completely healed. Overall I feel like he is doing excellent and there is no signs of infection. 11/07/2020 upon evaluation today patient appears to be doing well with regard to his heel ulcer. He tells me that last week when he was unable to come in his wife actually thought that the wound was very close to closing if not closed. Then it began to "reopen again". I really feel like what may have happened as the collagen may have dried  over the wound bed and that because that misunderstanding with thinking that the wound was healing. With that being said I did not see it last week I do not know that for certain. Either way I feel like he is doing great today I see no signs of infection at this point. 11/26/2020 upon inspection today patient appears to be doing decently well in regard to his heel ulcer. He has been tolerating the dressing changes without complication. Fortunately there is no sign of active infection at this time. No fevers, chills, nausea, vomiting, or diarrhea. 12/10/2020 upon evaluation today patient appears to be doing fairly well in regard to the wound on his heel as well as what appears to be a new wound of the left first metatarsal head plantar aspect. This seems to be an area that was callus that has split as the patient tells me has been trying to walk on his toes more has probably where this came from. With that being said there does not appear to be signs of active infection which is great news. 12/17/2020 upon evaluation today patient actually appears to be making good progress currently. Fortunately there is no evidence of active infection at this time. Overall I feel like he is very close to complete closure. 12/26/2020 upon evaluation today patient appears to be doing excellent in regard to his wounds. In fact I am not certain that these are not even completely healed on initial inspection. Overall I am very pleased with where things stand today. Good news is that they are healed he is actually get ready to go out of town and that will be helpful as well as he will be a full part of the time. [name], [name] ([id number]) [id number] Page 3 of 11 Readmission: 02/04/2021 upon evaluation today patient appears to be doing well at this point  in regard to his left heel that I previously saw him for. Unfortunately he is having issues with his right lower extremity. He has significant  wounds at this point he also has erythema noted there is definite signs of cellulitis which is unfortunate. With that being said I think we do need to address this sooner rather than later. The good news is he did have a nice trip to the beach. He tells me that he had no issues during that time. 4/27; patient on Bactrim. Culture showed methicillin sensitive staph aureus therefore the Bactrim should be effective. 2 small areas on the right leg are healed the area on the mid aspect of the tibia almost 100% covered in a very adherent necrotic debris. We have been using silver alginate 02/20/2021 upon evaluation today patient appears to be doing well with regard to his wound. He is showing signs of improvement which is great news overall very pleased with where things stand today. No fevers, chills, nausea, vomiting, or diarrhea. 02/27/2021 upon evaluation today patient appears to be doing well with regard to his leg ulcer. He is tolerating the dressing changes and overall appears to be doing quite excellent. I am extremely pleased with where things stand and overall I think patient is making great progress. There is no sign of active infection at this time which is also great news. 03/06/2021 upon evaluation today patient appears to be doing excellent in regard to his wounds. In fact he appears to be completely healed today based on what I am seeing. This is excellent news and overall I am extremely pleased with where he stands. Overall the patient is happy to hear this as well this has been quite sometime coming. Readmission: 04/01/2021 patient unfortunately returns for readmission today. He tells me that he has been wearing his compression socks on the right leg daily and he does not really know what is going on and why his legs are doing what they are doing. We did therefore go ahead and probe deeper into exactly what has been going on with him today. Subsequently the patient tells me that when he gets up  and what we would call "first thing in the morning" are really 2 different things". He does not tend to sleep well so he tells me that he will often wake up at 3:00 in the morning. He will then potentially going into the living room to get in his chair where he may read a book for a little while and then potentially fall asleep back in his chair. He then subsequently wake up around 5:00 or so and then get up and in his words "putter around". This often will end with him proceeding at some point around 8 AM or 9 AM to putting on his compression socks. With that being said this means that anywhere from the 3:00 in the morning till roughly around 9:00 in the morning he has no compression on yet he is sitting in his recliner, walking around and up and about, and this is at least 5 to 6 hours of noncompressed time. That may be our issue here but I did not realize until we discussed this further today. Fortunately there does not appear to be any signs of active infection at this time which is great news. With that being said he has multiple wounds of the bilateral lower extremities. 04/10/2021 upon evaluation today patient appears to be doing well with regard to his legs for the most part. He does  look like he had some injury where his wrap slid down nonetheless he did some "doctoring on them". I do feel like most of the areas honestly have cleared back up which is great news. There does not appear to be any signs of active infection at this time which is also great news. No fevers, chills, nausea, vomiting, or diarrhea. 04/18/2021 upon evaluation today patient appears to be doing well with regard to his wounds in fact he appears to be completely healed which is great news. Fortunately there does not appear to be any signs of active infection which is great news. No fevers, chills, nausea, vomiting, or diarrhea. READMISSION 07/28/2021 This is a now 76 year old man we have had in this clinic for quite a bit of  this year. 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 He wears compression stockings. 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 He comes in an old running shoes although he says he wears better shoes at home. His history is that he noticed a blister in the right posterior heel. This open. He has been applying Neosporin to it currently the wound measures 2 x 1.4 cm. 100% slough covered. Not really offloading this in any rigorous way. Past medical history is essentially unchanged he has paroxysmal atrial fibrillation on Coumadin type 2 diabetes with a recent hemoglobin A1c of 7 on metformin. ABI in our clinic was 0.98 on the right 08/05/2021 upon evaluation today patient appears to be doing well with regard to his heel ulcer. Fortunately there does not appear to be any signs of active infection at this time. Overall I been very pleased with where things seem to be currently as far as the wound healing is concerned. Again this is first a lot of seeing him Dr. Dellia Nims readmitted him last week. Nonetheless I think we are definitely making some progress here. 08/11/2021 upon evaluation today patient's wound on the heel actually showing signs of excellent improvement. I am actually very pleased with where things stand currently. No fevers, chills, nausea, vomiting, or diarrhea. There does not appear to be any need for sharp debridement today either which is also great news. 08/25/2021 upon evaluation today patient appears to be doing well with regard to his heel ulcer. This is actually looking significantly improved compared to last time I saw him. Fortunately there does not appear to be any evidence of active infection at this time. No fevers, chills, nausea, vomiting, or diarrhea. 09/01/2021 upon evaluation today patient appears to be doing decently well in regard to his wounds. Fortunately there  does not appear to be any signs of active infection at this time which is great news and overall very pleased in that regard. I do not see any evidence of active infection systemically which is great news as well. No fevers, chills, nausea, vomiting, or diarrhea. I think that the heel is making excellent progress. 09/15/2021 upon evaluation today patient's heel unfortunately is significantly worse compared to what it was previous. I do believe that at this time he would benefit from switching back to something a little bit more offloading from the cushion shoe he has right now this is more of a heel protector what I really need is a Prevalon offloading boot which I think is good to do a lot better for him than just the small heel protector. 09/23/2021 upon evaluation today patient appears to be doing a little better  in regards to last week's visit. Overall I think that he is making good progress which is great news and there does not appear to be any evidence of active infection at this time. No fevers, chills, nausea, vomiting, or diarrhea. 09/30/2021 upon evaluation patient's heel is actually showing signs of good improvement which is great news. Fortunately there does not appear to be any evidence of active infection locally nor systemically at this point. No fevers, chills, nausea, vomiting, or diarrhea. 10/07/2021 upon evaluation today patient appears to be doing well currently in regard to his heel ulcer. He has been tolerating the dressing changes without complication. Fortunately I do not see any signs of active infection at this time which is great news. No fevers, chills, nausea, vomiting, or diarrhea. 12/27; wound is measuring smaller. We have been using silver alginate 10/28/2021 upon evaluation today patient appears to be doing well currently in regard to his heel ulcer. I am actually very pleased with where things stand and I think he is making excellent progress. Fortunately I do not see  any signs of active infection locally nor systemically at this point which is great news. Nonetheless I do believe that the patient would benefit from a new offloading shoe and has been using it Velcro is no longer functioning properly and he tells me that he almost tripped and fell because of that today. Obviously I do not want him following that would be very bad. 11/11/2021 upon evaluation today patient appears to be doing excellent in regard to his wound. In fact this appears to be completely healed based on what I see ABSHIR, [name] ([id number]) [id number] Page 4 of 11 currently. I do not see any signs of anything open or draining and I did double check just by clearing away some of the callus around the edges of the wound to ensure that there was nothing still open or hiding underneath. Once this was cleared away it appeared that the patient was doing significantly better at this time which is great news and there was nothing actually open. Readmission: 06-19-2022 upon evaluation today patient appears to be doing well currently in regard to his heel ulcer all things considered. He unfortunately is having a bit of breakdown in general as far as the heel is concerned not nearly as bad as what we noted last time he was here in the clinic. The good news is I do think that this should hopefully heal much more rapidly than what we noted last time. His past medical history has not changed he is on Coumadin. 07-03-2022 upon evaluation today patient appears to be doing better in regard to his heel. We will start to see some improvement here which is good news. Fortunately I do not see any evidence of infection locally or systemically at this time which is great news. No fevers, chills, nausea, vomiting, or diarrhea. 07-10-2022 upon evaluation today patient appears to be doing well currently in regard to his wound from the callus standpoint around the edges. Unfortunately from an  actual wound bed standpoint he has some deep tissue injury noted at this point. Again I am not sure exactly what is going on that is causing this pressure to the region but he is definitely gotten pressure that is occurring and causing this to not heal as effectively as what we would like to see. I discussed with him today that he may need to at night when he sleeping use a pillow up underneath his calf in  order to prevent the heel from touching the bed at all. I also think that it may be beneficial for him to monitor throughout his day and make sure there is no other time when he is getting the pressure to the heel. 07-23-2022 upon evaluation today patient appears to be doing poorly currently in regard to his heel ulcer. He has been tolerating the dressing changes without complication. Unfortunately I feel like he may be infected based on what I am seeing. 07-28-2022 I did review patient's culture results which showed positive for Proteus as well as Staphylococcus both of which would be treated with the Bactrim DS that I gave him at the last visit. This is good news and hopefully means that we should be able to apply the cast today as long as the wound itself does not appear to be doing poorly. Patient's wound bed actually showed signs of doing quite well and I am very pleased with where things stand and I do not see any signs of worsening overall which is great news. 08-04-2022 upon evaluation today patient appears to be doing well currently in regard to his heel ulcer. I am actually very pleased with where things stand and I do think this is looking significantly better. With regard to his right shin that is also showing signs of improvement which is great news. 08-10-2022 upon evaluation today patient's wound on the heel actually appears to be doing significantly better. In regard to the right anterior shin this is showing signs of healing it might even be completely healed but I still get a monitor  I cannot get anything to fill away but also could not see where there was anything actually draining at this point. I am tending to think healed but I want a monitor 1 more week before I call it for sure. 08-17-2022 upon evaluation today patient appears to be doing well currently in regard to his heel. Fortunately he is tolerating the dressing changes without complication. I do not see any signs of infection and overall I think he is making good progress here. We did get approval for the Apligraf but I think he had a $295 co-pay that would have to be paid per application. With that being said as good as he is doing right now I do not think that is necessary but is still an option depending on how things progress if we need to speed this up quite a bit. 08/24/2022; this patient has a wound on his left heel in the setting of type 2 diabetes. We have been using Prisma. He has a heel offloading boot 08-31-2022 upon evaluation today patient appears to be doing poorly in regard to his heel ulcer which is still showing signs of bruising I am just not as happy as I was when I saw him 2 weeks ago with this. He saw Dr. Dellia Nims last week. Also did not look good at that point apparently. Nonetheless I think that the patient is still continuing to have some counterpressure getting to the wound bed unfortunately. 09-07-2022 upon evaluation today patient appears to be doing well currently in regard to his heel ulcer. He has been tolerating the dressing changes without complication. Fortunately there does not appear to be any signs of active infection locally or systemically at this time. Fortunately I do not see any evidence of active infection at this time. 09-15-2022 upon evaluation today patient appears to be doing well currently in regard to his legs which in fact appear  to be completely healed this is great news. With that being said his heel does have some erythema and warmth as well as drainage I am concerned  about cellulitis at this location. 09-21-2022 upon evaluation today patient's wound is actually showing signs of being a little bit smaller but still we are not doing nearly as well as what I would like to see. Fortunately I do not see any signs of infection at this time which is great news and overall I am extremely pleased in that regard. With that being said I do think that he needs to continue to elevate his leg although the edema is under great control with a compression wrap I think switching to a different dressing topically may be beneficial. My suggestion is to switch him over to a Iodoflex dressing. Electronic Signature(s) Signed: 09/21/2022 2:41:22 PM By: Worthy Keeler PA-C Entered By: Worthy Keeler on 09/21/2022 14:41:22 -------------------------------------------------------------------------------- Physical Exam Details Patient Name: Date of Service: [name], [name] [name] 09/21/2022 1:30 PM Medical Record Number: [id number] Patient Account Number: [id number] Date of Birth/Sex: Treating RN: 09-05-1946 (76 y.o. [name] Primary Care Provider: Fulton Reek Other Clinician: Massie Kluver Referring Provider: Treating Provider/Extender: [name] in Treatment: [address] ([id number]) [id number] Page 5 of 11 Constitutional Well-nourished and well-hydrated in no acute distress. Respiratory normal breathing without difficulty. Psychiatric this patient is able to make decisions and demonstrates good insight into disease process. Alert and Oriented x 3. pleasant and cooperative. Notes Upon inspection patient's wound bed actually showed signs of good granulation epithelization at this point. Fortunately I do not see any evidence of infection locally nor systemically which is great news and overall I am extremely pleased with where things stand currently. Electronic Signature(s) Signed: 09/21/2022 2:41:37 PM By:  Worthy Keeler PA-C Entered By: Worthy Keeler on 09/21/2022 14:41:37 -------------------------------------------------------------------------------- Physician Orders Details Patient Name: Date of Service: [name], [name] [name] 09/21/2022 1:30 PM Medical Record Number: [id number] Patient Account Number: [id number] Date of Birth/Sex: Treating RN: 06/03/1946 (76 y.o. 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 Bathing/ Shower/ Hygiene May shower; gently cleanse wound with antibacterial soap, rinse and pat dry prior to dressing wounds Edema Control - Lymphedema / Segmental Compressive Device / Other 3 Layer Compression System for Lymphedema. - left lower leg Non-Wound Condition dditional non-wound orders/instructions: - Apply 40% Urea cream to dry cracked skin on feet. A Off-Loading Open toe surgical shoe with peg assist. Medications-Please add to medication list. ntibiotics - continue Bactrim P.O. A Wound Treatment Wound #10 - Calcaneus Wound Laterality: Left Prim Dressing: IODOFLEX 0.9% Cadexomer Iodine Pad 3 x Per Week/30 Days ary Discharge Instructions: Apply Iodoflex to wound bed only as directed. Secondary Dressing: Zetuvit Plus 4x8 (in/in) 3 x Per Week/30 Days [name], [name] ([id number]) [id number] Page 6 of 11 Compression Wrap: 3-LAYER WRAP - Profore Lite LF 3 Multilayer Compression Bandaging System 3 x Per Week/30 Days Discharge Instructions: Apply 3  multi-layer wrap as prescribed. Electronic Signature(s) Signed: 09/21/2022 4:48:10 PM By: Worthy Keeler PA-C Signed: 09/21/2022 4:50:46  PM By: Massie Kluver Entered By: Massie Kluver on 09/21/2022 14:16:19 -------------------------------------------------------------------------------- Problem List Details Patient Name: Date of Service: [name], [name] [name] 09/21/2022 1:30 PM Medical Record Number: [id number] Patient Account Number: [id number] Date of Birth/Sex: Treating RN: [birth year] (76 y.o. [name] Primary Care Provider: Fulton Reek Other Clinician: Massie Kluver Referring Provider: Treating Provider/Extender: Maurilio Lovely Weeks in Treatment: 13 Active Problems ICD-10 Encounter Code Description Active Date MDM Diagnosis E11.621 Type 2 diabetes mellitus with foot ulcer 06/19/2022 No Yes 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sed 06/19/2022 No Yes E11.42 Type 2 diabetes mellitus with diabetic polyneuropathy 06/19/2022 No Yes Inactive Problems Resolved Problems Electronic Signature(s) Signed: 09/21/2022 1:41:04 PM By: Worthy Keeler PA-C Entered By: Worthy Keeler on 09/21/2022 13:41:03 [name] ([id number]) [id number] Page 7 of 11 -------------------------------------------------------------------------------- Progress Note Details Patient Name: Date of Service: [name], [name] [name] 09/21/2022 1:30 PM Medical Record Number: [id number] Patient Account Number: [id number] Date of Birth/Sex: Treating RN: 1946-08-31 (76 y.o. [name] Primary Care Provider: Fulton Reek Other Clinician: Massie Kluver Referring Provider: Treating Provider/Extender: Maurilio Lovely Weeks in Treatment: 13 Subjective Chief Complaint Information obtained from Patient Left heel ulcer History of Present Illness (HPI) 09/24/2020 on evaluation today patient presents today for a heel  ulcer that he tells me has been present for about 2 years. He has been seeing podiatry and they have been attempting to manage this including what sounds to be a total contact cast, Unna boot, and just standard dressings otherwise as well. Most recently has been using triple antibiotic ointment. With that being said unfortunately despite everything he really has not had any significant improvement. He tells me that he cannot even really remember exactly how this began but he presumed it may have rubbed on his shoes or something of that nature. 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 He tells me that this does seem to help. Fortunately there is no signs of active infection at this time. His most recent hemoglobin A1c was 8.1 though he knows this was this year he cannot tell me the exact time. His fluid pills currently to help with some of the lower extremity edema although he does obviously have signs of venous stasis/lymphedema. Currently there is no evidence of active infection. No fevers, chills, nausea, vomiting, or diarrhea. Patient has had fairly recent ABIs which were performed on 07/19/2020 and revealed that he has normal findings in both the ankle and toe locations bilaterally. His ABI on the right was 1.09 on the left was 1.08 with a TBI on the right of 0.88 and on the left of 0.94. Triphasic flow was noted throughout. 10/08/2020 on evaluation today patient appears to be doing pretty well in regard to his left heel currently in fact this is doing a great job and seems to be healing quite nicely. Unfortunately on his right leg he had a pile of wood that actually fell on him injuring his right leg this is somewhat  erythematous has me concerned little bit about cellulitis though there is not really a good area to culture at this point. 10/24/2020 upon evaluation today patient  appears to be doing well with regard to his heel ulcer. He is showing signs of improvement which is great news. His right leg is completely healed. Overall I feel like he is doing excellent and there is no signs of infection. 11/07/2020 upon evaluation today patient appears to be doing well with regard to his heel ulcer. He tells me that last week when he was unable to come in his wife actually thought that the wound was very close to closing if not closed. Then it began to "reopen again". I really feel like what may have happened as the collagen may have dried over the wound bed and that because that misunderstanding with thinking that the wound was healing. With that being said I did not see it last week I do not know that for certain. Either way I feel like he is doing great today I see no signs of infection at this point. 11/26/2020 upon inspection today patient appears to be doing decently well in regard to his heel ulcer. He has been tolerating the dressing changes without complication. Fortunately there is no sign of active infection at this time. No fevers, chills, nausea, vomiting, or diarrhea. 12/10/2020 upon evaluation today patient appears to be doing fairly well in regard to the wound on his heel as well as what appears to be a new wound of the left first metatarsal head plantar aspect. This seems to be an area that was callus that has split as the patient tells me has been trying to walk on his toes more has probably where this came from. With that being said there does not appear to be signs of active infection which is great news. 12/17/2020 upon evaluation today patient actually appears to be making good progress currently. Fortunately there is no evidence of active infection at this time. Overall I feel like he is very close to  complete closure. 12/26/2020 upon evaluation today patient appears to be doing excellent in regard to his wounds. In fact I am not certain that these are not even completely healed on initial inspection. Overall I am very pleased with where things stand today. Good news is that they are healed he is actually get ready to go out of town and that will be helpful as well as he will be a full part of the time. Readmission: 02/04/2021 upon evaluation today patient appears to be doing well at this point in regard to his left heel that I previously saw him for. Unfortunately he is having issues with his right lower extremity. He has significant wounds at this point he also has erythema noted there is definite signs of cellulitis which is unfortunate. With that being said I think we do need to address this sooner rather than later. The good news is he did have a nice trip to the beach. He tells me that he had no issues during that time. 4/27; patient on Bactrim. Culture showed methicillin sensitive staph aureus therefore the Bactrim should be effective. 2 small areas on the right leg are healed the area on the mid aspect of the tibia almost 100% covered in a very adherent necrotic debris. We have been using silver alginate 02/20/2021 upon evaluation today patient appears to be doing well with regard to his wound. He is showing signs of improvement which is great news overall very pleased with where things stand today. No fevers, chills, nausea, vomiting, or diarrhea. 02/27/2021 upon evaluation today patient appears to be doing well with regard to  his leg ulcer. He is tolerating the dressing changes and overall appears to be doing quite excellent. I am extremely pleased with where things stand and overall I think patient is making great progress. There is no sign of active infection at this time which is also great news. 03/06/2021 upon evaluation today patient appears to be doing excellent in regard to his wounds.  In fact he appears to be completely healed today based on what I am seeing. This is excellent news and overall I am extremely pleased with where he stands. Overall the patient is happy to hear this as well this has been quite sometime coming. Readmission: 04/01/2021 patient unfortunately returns for readmission today. He tells me that he has been wearing his compression socks on the right leg daily and he does not really know what is going on and why his legs are doing what they are doing. We did therefore go ahead and probe deeper into exactly what has been going on with him today. Subsequently the patient tells me that when he gets up and what we would call "first thing in the morning" are really 2 different things". He does not tend to sleep well so he tells me that he will often wake up at 3:00 in the morning. He will then potentially going into the living room to get in his chair where he may read a book for a little while and then potentially fall asleep back in his chair. He then subsequently wake up around 5:00 or so and then get up and in his words "putter around". This often will end with him proceeding at some point around 8 AM or 9 AM to putting on his compression socks. With that being said this means that anywhere from the 3:00 in the morning till roughly around 9:00 in the morning he has no compression on yet he is sitting in his recliner, walking around and up and about, and this is at least 5 to 6 hours of noncompressed time. That may be our issue here but I did not realize until we discussed this further today. Fortunately there does not appear to be any signs of active infection at this time which is great news. With that being said he has multiple wounds of the bilateral lower extremities. [name], [name] ([id number]) [id number] Page 8 of 11 04/10/2021 upon evaluation today patient appears to be doing well with regard to his legs for the most part. He  does look like he had some injury where his wrap slid down nonetheless he did some "doctoring on them". I do feel like most of the areas honestly have cleared back up which is great news. There does not appear to be any signs of active infection at this time which is also great news. No fevers, chills, nausea, vomiting, or diarrhea. 04/18/2021 upon evaluation today patient appears to be doing well with regard to his wounds in fact he appears to be completely healed which is great news. Fortunately there does not appear to be any signs of active infection which is great news. No fevers, chills, nausea, vomiting, or diarrhea. READMISSION 07/28/2021 This is a now 76 year old man we have had in this clinic for quite a bit of this year. 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 He wears compression stockings. 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 He comes in an old running shoes although he says  he wears better shoes at home. His history is that he noticed a blister in the right posterior heel. This open. He has been applying Neosporin to it currently the wound measures 2 x 1.4 cm. 100% slough covered. Not really offloading this in any rigorous way. Past medical history is essentially unchanged he has paroxysmal atrial fibrillation on Coumadin type 2 diabetes with a recent hemoglobin A1c of 7 on metformin. ABI in our clinic was 0.98 on the right 08/05/2021 upon evaluation today patient appears to be doing well with regard to his heel ulcer. Fortunately there does not appear to be any signs of active infection at this time. Overall I been very pleased with where things seem to be currently as far as the wound healing is concerned. Again this is first a lot of seeing him Dr. Dellia Nims readmitted him last week. Nonetheless I think we are definitely making some progress here. 08/11/2021 upon  evaluation today patient's wound on the heel actually showing signs of excellent improvement. I am actually very pleased with where things stand currently. No fevers, chills, nausea, vomiting, or diarrhea. There does not appear to be any need for sharp debridement today either which is also great news. 08/25/2021 upon evaluation today patient appears to be doing well with regard to his heel ulcer. This is actually looking significantly improved compared to last time I saw him. Fortunately there does not appear to be any evidence of active infection at this time. No fevers, chills, nausea, vomiting, or diarrhea. 09/01/2021 upon evaluation today patient appears to be doing decently well in regard to his wounds. Fortunately there does not appear to be any signs of active infection at this time which is great news and overall very pleased in that regard. I do not see any evidence of active infection systemically which is great news as well. No fevers, chills, nausea, vomiting, or diarrhea. I think that the heel is making excellent progress. 09/15/2021 upon evaluation today patient's heel unfortunately is significantly worse compared to what it was previous. I do believe that at this time he would benefit from switching back to something a little bit more offloading from the cushion shoe he has right now this is more of a heel protector what I really need is a Prevalon offloading boot which I think is good to do a lot better for him than just the small heel protector. 09/23/2021 upon evaluation today patient appears to be doing a little better in regards to last week's visit. Overall I think that he is making good progress which is great news and there does not appear to be any evidence of active infection at this time. No fevers, chills, nausea, vomiting, or diarrhea. 09/30/2021 upon evaluation patient's heel is actually showing signs of good improvement which is great news. Fortunately there does not appear  to be any evidence of active infection locally nor systemically at this point. No fevers, chills, nausea, vomiting, or diarrhea. 10/07/2021 upon evaluation today patient appears to be doing well currently in regard to his heel ulcer. He has been tolerating the dressing changes without complication. Fortunately I do not see any signs of active infection at this time which is great news. No fevers, chills, nausea, vomiting, or diarrhea. 12/27; wound is measuring smaller. We have been using silver alginate 10/28/2021 upon evaluation today patient appears to be doing well currently in regard to his heel ulcer. I am actually very pleased with where things stand and I think he is making excellent  progress. Fortunately I do not see any signs of active infection locally nor systemically at this point which is great news. Nonetheless I do believe that the patient would benefit from a new offloading shoe and has been using it Velcro is no longer functioning properly and he tells me that he almost tripped and fell because of that today. Obviously I do not want him following that would be very bad. 11/11/2021 upon evaluation today patient appears to be doing excellent in regard to his wound. In fact this appears to be completely healed based on what I see currently. I do not see any signs of anything open or draining and I did double check just by clearing away some of the callus around the edges of the wound to ensure that there was nothing still open or hiding underneath. Once this was cleared away it appeared that the patient was doing significantly better at this time which is great news and there was nothing actually open. Readmission: 06-19-2022 upon evaluation today patient appears to be doing well currently in regard to his heel ulcer all things considered. He unfortunately is having a bit of breakdown in general as far as the heel is concerned not nearly as bad as what we noted last time he was here in the  clinic. The good news is I do think that this should hopefully heal much more rapidly than what we noted last time. His past medical history has not changed he is on Coumadin. 07-03-2022 upon evaluation today patient appears to be doing better in regard to his heel. We will start to see some improvement here which is good news. Fortunately I do not see any evidence of infection locally or systemically at this time which is great news. No fevers, chills, nausea, vomiting, or diarrhea. 07-10-2022 upon evaluation today patient appears to be doing well currently in regard to his wound from the callus standpoint around the edges. Unfortunately from an actual wound bed standpoint he has some deep tissue injury noted at this point. Again I am not sure exactly what is going on that is causing this pressure to the region but he is definitely gotten pressure that is occurring and causing this to not heal as effectively as what we would like to see. I discussed with him today that he may need to at night when he sleeping use a pillow up underneath his calf in order to prevent the heel from touching the bed at all. I also think that it may be beneficial for him to monitor throughout his day and make sure there is no other time when he is getting the pressure to the heel. 07-23-2022 upon evaluation today patient appears to be doing poorly currently in regard to his heel ulcer. He has been tolerating the dressing changes without complication. Unfortunately I feel like he may be infected based on what I am seeing. 07-28-2022 I did review patient's culture results which showed positive for Proteus as well as Staphylococcus both of which would be treated with the Bactrim DS that I gave him at the last visit. This is good news and hopefully means that we should be able to apply the cast today as long as the wound itself does not appear to be doing poorly. Patient's wound bed actually showed signs of doing quite well and I  am very pleased with where things stand and I do not see any signs of worsening overall which is great news. 08-04-2022 upon evaluation today patient  appears to be doing well currently in regard to his heel ulcer. I am actually very pleased with where things stand and I do think this is looking significantly better. With regard to his right shin that is also showing signs of improvement which is great news. 08-10-2022 upon evaluation today patient's wound on the heel actually appears to be doing significantly better. In regard to the right anterior shin this is showing signs of healing it might even be completely healed but I still get a monitor I cannot get anything to fill away but also could not see where there was anything actually draining at this point. I am tending to think healed but I want a monitor 1 more week before I call it for sure. 08-17-2022 upon evaluation today patient appears to be doing well currently in regard to his heel. Fortunately he is tolerating the dressing changes without [name], [name] ([id number]) [id number] Page 9 of 11 complication. I do not see any signs of infection and overall I think he is making good progress here. We did get approval for the Apligraf but I think he had a $295 co-pay that would have to be paid per application. With that being said as good as he is doing right now I do not think that is necessary but is still an option depending on how things progress if we need to speed this up quite a bit. 08/24/2022; this patient has a wound on his left heel in the setting of type 2 diabetes. We have been using Prisma. He has a heel offloading boot 08-31-2022 upon evaluation today patient appears to be doing poorly in regard to his heel ulcer which is still showing signs of bruising I am just not as happy as I was when I saw him 2 weeks ago with this. He saw Dr. Dellia Nims last week. Also did not look good at that point apparently.  Nonetheless I think that the patient is still continuing to have some counterpressure getting to the wound bed unfortunately. 09-07-2022 upon evaluation today patient appears to be doing well currently in regard to his heel ulcer. He has been tolerating the dressing changes without complication. Fortunately there does not appear to be any signs of active infection locally or systemically at this time. Fortunately I do not see any evidence of active infection at this time. 09-15-2022 upon evaluation today patient appears to be doing well currently in regard to his legs which in fact appear to be completely healed this is great news. With that being said his heel does have some erythema and warmth as well as drainage I am concerned about cellulitis at this location. 09-21-2022 upon evaluation today patient's wound is actually showing signs of being a little bit smaller but still we are not doing nearly as well as what I would like to see. Fortunately I do not see any signs of infection at this time which is great news and overall I am extremely pleased in that regard. With that being said I do think that he needs to continue to elevate his leg although the edema is under great control with a compression wrap I think switching to a different dressing topically may be beneficial. My suggestion is to switch him over to a Iodoflex dressing. Objective Constitutional Well-nourished and well-hydrated in no acute distress. Vitals Time Taken: 1:43 PM, Height: 70 in, Weight: 265 lbs, BMI: 38, Temperature: 98.7 F, Pulse: 59 bpm, Respiratory Rate: 18 breaths/min, Blood Pressure: 138/67 mmHg. Respiratory normal  breathing without difficulty. Psychiatric this patient is able to make decisions and demonstrates good insight into disease process. Alert and Oriented x 3. pleasant and cooperative. General Notes: Upon inspection patient's wound bed actually showed signs of good granulation epithelization at this  point. Fortunately I do not see any evidence of infection locally nor systemically which is great news and overall I am extremely pleased with where things stand currently. Integumentary (Hair, Skin) Wound #10 status is Open. Original cause of wound was Gradually Appeared. The date acquired was: 06/05/2022. The wound has been in treatment 13 weeks. The wound is located on the Left Calcaneus. The wound measures 1.4cm length x 2.5cm width x 0.1cm depth; 2.749cm^2 area and 0.275cm^3 volume. There is Fat Layer (Subcutaneous Tissue) exposed. There is no tunneling or undermining noted. There is a medium amount of serosanguineous drainage noted. The wound margin is flat and intact. There is small (1-33%) pink granulation within the wound bed. There is a large (67-100%) amount of necrotic tissue within the wound bed including Adherent Slough. 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 There was a Excisional Skin/Subcutaneous Tissue Debridement with a total area of 3.5 sq cm performed by Tommie Sams., PA-C. With the following instrument(s): Curette to remove Viable and Non-Viable tissue/material. Material removed includes Subcutaneous Tissue, Slough, and Biofilm. A time out was conducted at 14:12, prior to the start of the procedure. A Minimum amount of bleeding was controlled with Pressure. The procedure was tolerated well. Post Debridement Measurements: 1.4cm length x 2.5cm width x 0.1cm depth; 0.275cm^3 volume. Character of Wound/Ulcer Post Debridement is stable. Severity of Tissue Post Debridement is: Fat layer exposed. Post procedure Diagnosis  Wound #10: Same as Pre-Procedure [name], [name] ([id number]) [id number] Page 10 of 11 Plan Follow-up Appointments: Wound #10 Left Calcaneus: Return Appointment in 1 week. Bathing/ Shower/ Hygiene: May shower; gently cleanse wound with antibacterial soap, rinse and pat dry prior to dressing wounds Edema Control - Lymphedema / Segmental Compressive Device / Other: 3 Layer Compression System for Lymphedema. - left lower leg Non-Wound Condition: Additional non-wound orders/instructions: - Apply 40% Urea cream to dry cracked skin on feet. Off-Loading: Open toe surgical shoe with peg assist. Medications-Please add to medication list.: P.O. Antibiotics - continue Bactrim WOUND #10: - Calcaneus Wound Laterality: Left Prim Dressing: IODOFLEX 0.9% Cadexomer Iodine Pad 3 x Per Week/30 Days ary Discharge Instructions: Apply Iodoflex to wound bed only as directed. Secondary Dressing: Zetuvit Plus 4x8 (in/in) 3 x Per Week/30 Days Com pression Wrap: 3-LAYER WRAP - Profore Lite LF 3 Multilayer Compression Bandaging System 3 x Per Week/30 Days Discharge Instructions: Apply 3 multi-layer wrap as prescribed. 1. I am going to recommend that we have the patient continue to monitor for any signs of infection or worsening the compression wrap seems to be doing great for his leg that has completely sealed up and is in good control good shape and looks much smaller. 2. With regard to the heel wound we will get a switch over to an Iodoflex dressing to try to see if we can cut back on some of the biofilm that is building up. I think that that is going to be one  of the biggest deals as far as treatment going forward is concerned. He is in agreement with that plan and we will see how things do. We will see patient back for reevaluation in 1 week here in the clinic. If anything worsens or changes patient will contact our office for additional recommendations. Electronic  Signature(s) Signed: 09/21/2022 2:42:27 PM By: Worthy Keeler PA-C Entered By: Worthy Keeler on 09/21/2022 14:42:27 -------------------------------------------------------------------------------- SuperBill Details Patient Name: Date of Service: [name], [name] [name] 09/21/2022 Medical Record Number: [id number] Patient Account Number: [id number] Date of Birth/Sex: Treating RN: 01-21-1946 (76 y.o. 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 Facility Procedures : CPT4 Code: 29021115 Description: 52080 - DEB SUBQ TISSUE 20 SQ CM/< ICD-10 Diagnosis Description 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sed Modifier: Quantity: 1 Physician Procedures [name], [name] ([id number]): CPT4 Code Description 4975300 51102 - WC PHYS SUBQ TISS 20 SQ CM ICD-10 Diagnosis Description L97.522 Non-pressure chronic ulcer of other part of left foot with fat [id number] Page 11 of 11: Quantity Modifier 1 layer exposed Electronic Signature(s) Signed: 09/21/2022 2:42:58 PM By: Worthy Keeler PA-C Entered By: Worthy Keeler on 09/21/2022 14:42:57

## 2022-09-21 NOTE — Progress Notes (Signed)
HEMI, CHACKO ([id number]) [phone] [id number] Page 1 of 1 Visit Report for 09/21/2022 Fall Risk Assessment Details Patient Name: Date of Service: [name], [name] [facility] J. 09/21/2022 1:30 PM Medical Record Number: [id number] Patient Account Number: [id number] Date of Birth/Sex: Treating RN: 12-[name]-1946 (76 y.o. [name], [name] Primary Care Tien Aispuro: Fulton Reek Other Clinician: Massie Kluver Referring Cameran Pettey: Treating Jeannie Mallinger/Extender: [name] in Treatment: 13 Fall Risk Assessment Items Have you had 2 or more falls in the last 12 monthso 0 Yes Have you had any fall that resulted in injury in the last 12 monthso 0 No FALLS RISK SCREEN History of falling - immediate or within 3 months 25 Yes Secondary diagnosis (Do you have 2 or more medical diagnoseso) 15 Yes Ambulatory aid None/bed rest/wheelchair/nurse 0 No Crutches/cane/walker 15 Yes Furniture 0 No Intravenous therapy Access/Saline/Heparin Lock 0 No Gait/Transferring Normal/ bed rest/ wheelchair 0 No Weak (short steps with or without shuffle, stooped but able to lift head while walking, may seek 0 No support from furniture) Impaired (short steps with shuffle, may have difficulty arising from chair, head down, impaired 20 Yes balance) Mental Status Oriented to own ability 0 Yes Notes discussed with patient the importance of using a walker for more stability while walking Electronic Signature(s) Signed: 09/21/2022 2:54:45 PM By: [name], BSN, RN, CWS, [name] RN, BSN Signed: 09/21/2022 4:50:46 PM By: Massie Kluver Entered By: Massie Kluver on 09/21/2022 13:43:22

## 2022-09-21 NOTE — Progress Notes (Signed)
[name] ([id number]) [id number] Page 1 of 9 Visit Report for 09/21/2022 Arrival Information Details Patient Name: Date of Service: [name], [name] [facility] J. 09/21/2022 1:30 PM Medical Record Number: [id number] Patient Account Number: [id number] Date of Birth/Sex: Treating RN: [birth year] (76 y.o. [name] Primary Care Jonika Critz: Fulton Reek Other Clinician: Massie Kluver Referring Gayla Benn: Treating Maxxwell Edgett/Extender: [name] in Treatment: 64 Visit Information History Since Last Visit All ordered tests and consults were completed: No Patient Arrived: [name] Added or deleted any medications: No Arrival Time: 13:39 Any new allergies or adverse reactions: No Transfer Assistance: None Had a fall or experienced change in Yes Patient Identification Verified: Yes activities of daily living that may affect Secondary Verification Process Completed: Yes risk of falls: Patient Requires Transmission-Based Precautions: No Signs or symptoms of abuse/neglect since No Patient Has Alerts: Yes last visito Patient Alerts: Patient on Blood Thinner Hospitalized since last visit: No Warfarin Implantable device outside of the clinic No Type II Diabetic excluding cellular tissue based products placed in the center since last visit: Has Dressing in Place as Prescribed: Yes Has Compression in Place as Prescribed: Yes Has Footwear/Offloading in Place as Yes Prescribed: Left: Surgical Shoe with Pressure Relief Insole Pain Present Now: No Electronic Signature(s) Signed: 09/21/2022 4:50:46 PM By: Massie Kluver Entered By: Massie Kluver on 09/21/2022 13:41:14 -------------------------------------------------------------------------------- Clinic Level of Care Assessment Details Patient Name: Date of Service: [name] [facility] J. 09/21/2022 1:30 PM Medical Record Number: [id number] Patient Account Number: [id number] Date of Birth/Sex:  Treating RN: November 04, 1945 (76 y.o. [name] Primary Care Marieliz Strang: Fulton Reek Other Clinician: Massie Kluver Referring Courteny Egler: Treating Thanh Pomerleau/Extender: [name] in Treatment: King Assessment Items [name], [name] ([id number]) [id number] Page 2 of 9 TOOL 1 Quantity Score _0  - 0 Use when EandM and Procedure is performed on INITIAL visit ASSESSMENTS - Nursing Assessment / Reassessment _1  - 0 General Physical Exam (combine w/ comprehensive assessment (listed just below) when performed on new pt. evals) _2  - 0 Comprehensive Assessment (HX, ROS, Risk Assessments, Wounds Hx, etc.) ASSESSMENTS - Wound and Skin Assessment / Reassessment _3  - 0 Dermatologic / Skin Assessment (not related to wound area) ASSESSMENTS - Ostomy and/or Continence Assessment and Care _4  - 0 Incontinence Assessment and Management _5  - 0 Ostomy Care Assessment and Management (repouching, etc.) PROCESS - Coordination of Care _6  - 0 Simple Patient / Family Education for ongoing care _7  - 0 Complex (extensive) Patient / Family Education for ongoing care _8  - 0 Staff obtains Programmer, systems, Records, T Results / Process Orders est _9  - 0 Staff telephones HHA, Nursing Homes / Clarify orders / etc _10  - 0 Routine Transfer to another Facility (non-emergent condition) _11  - 0 Routine Hospital Admission (non-emergent condition) _12  - 0 New Admissions / Biomedical engineer / Ordering NPWT Apligraf, etc. , _13  - 0 Emergency Hospital Admission (emergent condition) PROCESS - Special Needs _14  - 0 Pediatric / Minor Patient Management _15  - 0 Isolation Patient Management _16  - 0 Hearing / Language / Visual special needs _17  - 0 Assessment of Community assistance (transportation, D/C planning, etc.) _18  - 0 Additional assistance / Altered mentation _19  - 0 Support Surface(s) Assessment (bed, cushion, seat, etc.) INTERVENTIONS -  Miscellaneous _20  - 0 External ear exam _21  - 0 Patient Transfer (multiple staff / Civil Service fast streamer / Similar devices) _22  - 0 Simple Staple / Suture removal (25 or less) _23  - 0 Complex Staple / Suture removal (  26 or more) _0  - 0 Hypo/Hyperglycemic Management (do not check if billed separately) _1  - 0 Ankle / Brachial Index (ABI) - do not check if billed separately Has the patient been seen at the hospital within the last three years: Yes Total Score: 0 Level Of Care: ____ Electronic Signature(s) Signed: 09/21/2022 4:50:46 PM By: Massie Kluver Entered By: Massie Kluver on 09/21/2022 14:16:25 [name] ([id number]) [id number] Page 3 of 9 -------------------------------------------------------------------------------- Encounter Discharge Information Details Patient Name: Date of Service: [name], [name] [facility] J. 09/21/2022 1:30 PM Medical Record Number: [id number] Patient Account Number: [id number] Date of Birth/Sex: Treating RN: June 25, 1946 (76 y.o. [name] Primary Care Press Casale: Fulton Reek Other Clinician: Massie Kluver Referring Azari Hasler: Treating Darice Vicario/Extender: [name] in Treatment: 13 Encounter Discharge Information Items Post Procedure Vitals Discharge Condition: Stable Temperature (F): 97.8 Ambulatory Status: Cane Pulse (bpm): 59 Discharge Destination: Home Respiratory Rate (breaths/min): 18 Transportation: Private Auto Blood Pressure (mmHg): 138/67 Accompanied By: self Schedule Follow-up Appointment: Yes Clinical Summary of Care: Electronic Signature(s) Signed: 09/21/2022 4:50:46 PM By: Massie Kluver Entered By: Massie Kluver on 09/21/2022 16:43:28 -------------------------------------------------------------------------------- Lower Extremity Assessment Details Patient Name: Date of Service: [name], [name] [name] 09/21/2022 1:30 PM Medical Record Number: [id number] Patient Account Number:  [id number] Date of Birth/Sex: Treating RN: 12-Sep-1946 (76 y.o. [name] Primary Care Kamaree Wheatley: Fulton Reek Other Clinician: Massie Kluver Referring Ab Leaming: Treating Seiya Silsby/Extender: Maurilio Lovely Weeks in Treatment: 13 Edema Assessment Assessed: Shirlyn Goltz: Yes] Patrice Paradise: No] Edema: [Left: Ye] [Right: s] Calf Left: Right: Point of Measurement: 33 cm From Medial Instep 39.7 cm Ankle Left: Right: Point of Measurement: 11 cm From Medial Instep 24.5 cm Vascular Assessment Pulses: Dorsalis Pedis Palpable: [Left:Yes] Electronic Signature(s) Signed: 09/21/2022 2:54:45 PM By: [name], BSN, RN, CWS, [name] RN, BSN Signed: 09/21/2022 4:50:46 PM By: Massie Kluver Entered By: Massie Kluver on 09/21/2022 13:58:47 [name] ([id number]) [id number] Page 4 of 9 -------------------------------------------------------------------------------- Multi Wound Chart Details Patient Name: Date of Service: [name], [name] [name] 09/21/2022 1:30 PM Medical Record Number: [id number] Patient Account Number: [id number] Date of Birth/Sex: Treating RN: 1946-04-07 (76 y.o. [name] Primary Care Guerry Covington: Fulton Reek Other Clinician: Massie Kluver Referring Terrance Usery: Treating Kecia Swoboda/Extender: Maurilio Lovely Weeks in Treatment: 13 Vital Signs Height(in): 70 Pulse(bpm): 6 Weight(lbs): 265 Blood Pressure(mmHg): 138/67 Body Mass Index(BMI): 38 Temperature(F): 98.7 Respiratory Rate(breaths/min): 18 [10:Photos:] [N/A:N/A] Left Calcaneus N/A N/A Wound Location: Gradually Appeared N/A N/A Wounding 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 N/A N/A Secondary Etiology: Cataracts, Arrhythmia, Coronary N/A N/A Comorbid History: Artery Disease, Hypertension, Type II Diabetes, Osteoarthritis, Neuropathy 06/05/2022 N/A N/A Date Acquired: 13 N/A N/A Weeks of Treatment: Open N/A  N/A Wound Status: No N/A N/A Wound Recurrence: 1.4x2.5x0.1 N/A N/A Measurements L x W x D (cm) 2.749 N/A N/A A (cm) : rea 0.275 N/A N/A Volume (cm) : -94.40% N/A N/A % Reduction in A rea: 2.80% N/A N/A % Reduction in Volume: Grade 1 N/A N/A Classification: Medium N/A N/A Exudate A mount: Serosanguineous N/A N/A Exudate Type: red, brown N/A N/A Exudate Color: Flat and Intact N/A N/A Wound Margin: Small (1-33%) N/A N/A Granulation A mount: Pink N/A N/A Granulation Quality: Large (67-100%) N/A N/A Necrotic A mount: Fat Layer (Subcutaneous Tissue): Yes N/A N/A Exposed Structures: Fascia: No Tendon: No Muscle: No Joint: No Bone: No Small (1-33%) N/A N/A Epithelialization: Treatment Notes Electronic Signature(s) Signed: 09/21/2022 4:50:46 PM By: Massie Kluver  Entered By: Massie Kluver on 09/21/2022 13:58:53 [name] ([id number]) [id number] Page 5 of 9 -------------------------------------------------------------------------------- Multi-Disciplinary Care Plan Details Patient Name: Date of Service: [name], [name] [name] 09/21/2022 1:30 PM Medical Record Number: [id number] Patient Account Number: [id number] Date of Birth/Sex: Treating RN: [birth year] (76 y.o. [name], [name] Primary Care Zyliah Schier: Fulton Reek Other Clinician: Massie Kluver Referring Evalette Montrose: Treating Destanee Bedonie/Extender: [name] in Treatment: 13 Active Inactive Pressure Nursing Diagnoses: Knowledge deficit related to causes and risk factors for pressure ulcer development Knowledge deficit related to management of pressures ulcers Potential for impaired tissue integrity related to pressure, friction, moisture, and shear Goals: Patient will remain free from development of additional pressure ulcers Date Initiated: 06/19/2022 Date Inactivated: 07/28/2022 Target Resolution Date: 06/19/2022 Goal Status: Met Patient/caregiver will  verbalize risk factors for pressure ulcer development Date Initiated: 06/19/2022 Date Inactivated: 07/28/2022 Target Resolution Date: 06/19/2022 Goal Status: Met Patient/caregiver will verbalize understanding of pressure ulcer management Date Initiated: 06/19/2022 Target Resolution Date: 08/14/2022 Goal Status: Active Interventions: Assess: immobility, friction, shearing, incontinence upon admission and as needed Assess offloading mechanisms upon admission and as needed Assess potential for pressure ulcer upon admission and as needed Provide education on pressure ulcers Notes: Wound/Skin Impairment Nursing Diagnoses: Impaired tissue integrity Goals: Patient/caregiver will verbalize understanding of skin care regimen Date Initiated: 06/19/2022 Date Inactivated: 07/10/2022 Target Resolution Date: 06/19/2022 Goal Status: Met Ulcer/skin breakdown will have a volume reduction of 30% by week 4 Date Initiated: 06/19/2022 Date Inactivated: 07/28/2022 Target Resolution Date: 07/17/2022 Goal Status: Unmet Unmet Reason: infection Ulcer/skin breakdown will have a volume reduction of 50% by week 8 Date Initiated: 07/28/2022 Target Resolution Date: 08/14/2022 Goal Status: Active Interventions: Assess patient/caregiver ability to obtain necessary supplies Assess patient/caregiver ability to perform ulcer/skin care regimen upon admission and as needed Assess ulceration(s) every visit Provide education on ulcer and skin care Treatment Activities: JAYDRIEN, WASSENAAR ([id number]) [id number] Page 6 of 9 Skin care regimen initiated : 06/19/2022 Topical wound management initiated : 06/19/2022 Notes: Electronic Signature(s) Signed: 09/21/2022 2:54:45 PM By: [name], BSN, RN, CWS, [name] RN, BSN Signed: 09/21/2022 4:50:46 PM By: Massie Kluver Entered By: Massie Kluver on 09/21/2022 14:17:05 -------------------------------------------------------------------------------- Pain Assessment  Details Patient Name: Date of Service: [name], [name] [name] 09/21/2022 1:30 PM Medical Record Number: [id number] Patient Account Number: [id number] Date of Birth/Sex: Treating RN: 18-Sep-1946 (76 y.o. [name] Primary Care Biruk Troia: Fulton Reek Other Clinician: Massie Kluver Referring Miken Stecher: Treating Larrie Lucia/Extender: [name] in Treatment: 13 Active Problems Location of Pain Severity and Description of Pain Patient Has Paino No Site Locations Pain Management and Medication Current Pain Management: Electronic Signature(s) Signed: 09/21/2022 2:54:45 PM By: [name], BSN, RN, CWS, [name] RN, BSN Signed: 09/21/2022 4:50:46 PM By: Massie Kluver Entered By: Massie Kluver on 09/21/2022 13:47:29 [name] ([id number]) [id number] Page 7 of 9 -------------------------------------------------------------------------------- Patient/Caregiver Education Details Patient Name: Date of Service: [name] [facility] 12/4/2023andnbsp1:30 PM Medical Record Number: [id number] Patient Account Number: [id number] Date of Birth/Gender: Treating RN: 1946/07/11 (76 y.o. [name] Primary Care Physician: Fulton Reek Other Clinician: Massie Kluver Referring Physician: Treating Physician/Extender: [name] in Treatment: 13 Education Assessment Education Provided To: Patient Education Topics Provided Wound/Skin Impairment: Handouts: Other: continue wound care as directed Methods: Explain/Verbal Responses: State content correctly Electronic Signature(s) Signed: 09/21/2022 4:50:46 PM By: Massie Kluver Entered By: Massie Kluver on 09/21/2022 14:16:56 -------------------------------------------------------------------------------- Wound Assessment Details Patient Name: Date of Service: [name], [name]  [name] 09/21/2022 1:30 PM Medical Record Number: [id number] Patient Account Number: [id number] Date of  Birth/Sex: Treating RN: December 24, 1945 (76 y.o. [name] Primary Care Jream Broyles: Fulton Reek Other Clinician: Massie Kluver Referring Kenley Troop: Treating Shakema Surita/Extender: Maurilio Lovely Weeks in Treatment: 13 Wound Status Wound Number: 10 Primary Diabetic Wound/Ulcer of the Lower Extremity Etiology: Wound Location: Left Calcaneus Secondary Pressure Ulcer Wounding Event: Gradually Appeared Etiology: Date Acquired: 06/05/2022 Wound Open Weeks Of Treatment: 13 Status: Clustered Wound: No Comorbid Cataracts, Arrhythmia, Coronary Artery Disease, Hypertension, History: Type II Diabetes, Osteoarthritis, Neuropathy Photos ARLIE, RIKER ([id number]) [id number] Page 8 of 9 Wound Measurements Length: (cm) 1.4 Width: (cm) 2.5 Depth: (cm) 0.1 Area: (cm) 2.749 Volume: (cm) 0.275 % Reduction in Area: -94.4% % Reduction in Volume: 2.8% Epithelialization: Small (1-33%) Tunneling: No Undermining: No Wound Description Classification: Grade 1 Wound Margin: Flat and Intact Exudate Amount: Medium Exudate Type: Serosanguineous Exudate Color: red, brown Foul Odor After Cleansing: No Slough/Fibrino Yes Wound Bed Granulation Amount: Small (1-33%) Exposed Structure Granulation Quality: Pink Fascia Exposed: No Necrotic Amount: Large (67-100%) Fat Layer (Subcutaneous Tissue) Exposed: Yes Necrotic Quality: Adherent Slough Tendon Exposed: No Muscle Exposed: No Joint Exposed: No Bone Exposed: No Treatment Notes Wound #10 (Calcaneus) Wound Laterality: Left Cleanser Peri-Wound Care Topical Primary Dressing IODOFLEX 0.9% Cadexomer Iodine Pad Discharge Instruction: Apply Iodoflex to wound bed only as directed. Secondary Dressing Zetuvit Plus 4x8 (in/in) Secured With Compression Wrap 3-LAYER WRAP - Profore Lite LF 3 Multilayer Compression Bandaging System Discharge Instruction: Apply 3 multi-layer wrap as prescribed. Compression  Stockings Add-Ons Electronic Signature(s) Signed: 09/21/2022 2:54:45 PM By: [name], BSN, RN, CWS, [name] RN, BSN Signed: 09/21/2022 4:50:46 PM By: Massie Kluver Entered By: Massie Kluver on 09/21/2022 13:57:46 [name] ([id number]) [id number] Page 9 of 9 -------------------------------------------------------------------------------- Vitals Details Patient Name: Date of Service: [name], [name] [name] 09/21/2022 1:30 PM Medical Record Number: [id number] Patient Account Number: [id number] Date of Birth/Sex: Treating RN: Apr 26, 1946 (76 y.o. [name], [name] Primary Care Tyqwan Pink: Fulton Reek Other Clinician: Massie Kluver Referring Garnet Overfield: Treating Christee Mervine/Extender: Maurilio Lovely Weeks in Treatment: 13 Vital Signs Time Taken: 13:43 Temperature (F): 98.7 Height (in): 70 Pulse (bpm): 59 Weight (lbs): 265 Respiratory Rate (breaths/min): 18 Body Mass Index (BMI): 38 Blood Pressure (mmHg): 138/67 Reference Range: 80 - 120 mg / dl Electronic Signature(s) Signed: 09/21/2022 4:50:46 PM By: Massie Kluver Entered By: Massie Kluver on 09/21/2022 13:47:21

## 2022-09-28 ENCOUNTER — Encounter: Payer: Medicare HMO | Admitting: Physician Assistant

## 2022-09-28 DIAGNOSIS — E11621 Type 2 diabetes mellitus with foot ulcer: Secondary | ICD-10-CM | POA: Diagnosis not present

## 2022-09-28 NOTE — Progress Notes (Addendum)
[name], [name] ([id number]) [id number] Page 1 of 11 Visit Report for 09/28/2022 Chief Complaint Document Details Patient Name: Date of Service: [name], [name] [facility] J. 09/28/2022 1:30 PM Medical Record Number: [id number] Patient Account Number: [id number] Date of Birth/Sex: Treating RN: 1946/07/28 (76 y.o. [name] Primary Care Provider: Fulton Reek Other Clinician: Massie Kluver Referring Provider: Treating Provider/Extender: Maurilio Lovely Weeks in Treatment: 14 Information Obtained from: Patient Chief Complaint Left heel ulcer Electronic Signature(s) Signed: 09/28/2022 1:20:47 PM By: Worthy Keeler PA-C Entered By: Worthy Keeler on 09/28/2022 13:20:47 -------------------------------------------------------------------------------- Debridement Details Patient Name: Date of Service: [name], [name] [name] 09/28/2022 1:30 PM Medical Record Number: [id number] Patient Account Number: [id number] Date of Birth/Sex: Treating RN: [birth year] (76 y.o. [name] Primary Care Provider: Fulton Reek Other Clinician: Massie Kluver Referring Provider: Treating Provider/Extender: Maurilio Lovely Weeks in Treatment: 14 Debridement Performed for Assessment: Wound #10 Left Calcaneus Performed By: Physician Tommie Sams., PA-C Debridement Type: Debridement Severity of Tissue Pre Debridement: Fat layer exposed Level of Consciousness (Pre-procedure): Awake and Alert Pre-procedure Verification/Time Out Yes - 14:11 Taken: Start Time: 14:11 T Area Debrided (L x W): otal 2 (cm) x 3 (cm) = 6 (cm) Tissue and other material debrided: Viable, Non-Viable, Callus, Slough, Subcutaneous, Slough Level: Skin/Subcutaneous Tissue Debridement Description: Excisional Instrument: Curette Bleeding: Minimum Hemostasis Achieved: Pressure Response to Treatment: Procedure was tolerated well Level of Consciousness (Post- Awake and  Alert procedure): [name], [name] ([id number]) [id number] Page 2 of 11 Post Debridement Measurements of Total Wound Length: (cm) 1.2 Width: (cm) 2.1 Depth: (cm) 0.2 Volume: (cm) 0.396 Character of Wound/Ulcer Post Debridement: Stable Severity of Tissue Post Debridement: Fat layer exposed Post Procedure Diagnosis Same as Pre-procedure Electronic Signature(s) Signed: 09/28/2022 3:51:57 PM By: Rosalio Loud MSN RN CNS WTA Signed: 09/28/2022 5:07:14 PM By: Worthy Keeler PA-C Signed: 10/06/2022 4:45:28 PM By: Massie Kluver Entered By: Massie Kluver on 09/28/2022 14:15:23 -------------------------------------------------------------------------------- HPI Details Patient Name: Date of Service: [name], [name] [name] 09/28/2022 1:30 PM Medical Record Number: [id number] Patient Account Number: [id number] Date of Birth/Sex: Treating RN: May 18, 1946 (76 y.o. [name] Primary Care Provider: Fulton Reek Other Clinician: Massie Kluver Referring Provider: Treating Provider/Extender: Maurilio Lovely Weeks in Treatment: 14 History of Present Illness HPI Description: 09/24/2020 on evaluation today patient presents today for a heel ulcer that he tells me has been present for about 2 years. He has been seeing podiatry and they have been attempting to manage this including what sounds to be a total contact cast, Unna boot, and just standard dressings otherwise as well. Most recently has been using triple antibiotic ointment. With that being said unfortunately despite everything he really has not had any significant improvement. He tells me that he cannot even really remember exactly how this began but he presumed it may have rubbed on his shoes or something of that nature. 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 He tells me that this does seem to help. Fortunately there is no signs of active infection at this time. His most recent hemoglobin A1c was 8.1 though he knows this was this year he cannot tell me the exact time. His  fluid pills currently to help with some of the lower extremity edema although he does obviously have signs of venous stasis/lymphedema. Currently there is no evidence of active infection. No fevers, chills, nausea, vomiting, or diarrhea. Patient has had fairly recent ABIs which were performed on 07/19/2020 and revealed that he has normal findings in both the ankle and toe locations bilaterally. His ABI on the right was 1.09 on the left was 1.08 with a TBI on the right of 0.88 and on the left of 0.94. Triphasic flow was noted throughout. 10/08/2020 on evaluation today patient appears to be doing pretty well in regard to his left heel currently in fact this is doing a great job and seems to be healing quite nicely. Unfortunately on his right leg he had a pile of wood that actually fell on him injuring his right leg this is somewhat erythematous has me concerned little bit about cellulitis though there is not really a good area to culture at this point. 10/24/2020 upon evaluation today patient appears to be doing well with regard to his heel ulcer. He is showing signs of improvement which is great news. His right leg is completely healed. Overall I feel like he is doing excellent and there is no signs of infection. 11/07/2020 upon evaluation today patient appears to be doing well with regard to his heel ulcer. He tells me that last week when he was unable to come in his wife actually thought that the wound was very close to closing if not closed. Then it began to "reopen again". I really feel like what may have happened as the collagen may have dried  over the wound bed and that because that misunderstanding with thinking that the wound was healing. With that being said I did not see it last week I do not know that for certain. Either way I feel like he is doing great today I see no signs of infection at this point. 11/26/2020 upon inspection today patient appears to be doing decently well in regard to his heel ulcer. He has been tolerating the dressing changes without complication. Fortunately there is no sign of active infection at this time. No fevers, chills, nausea, vomiting, or diarrhea. 12/10/2020 upon evaluation today patient appears to be doing fairly well in regard to the wound on his heel as well as what appears to be a new wound of the left first metatarsal head plantar aspect. This seems to be an area that was callus that has split as the patient tells me has been trying to walk on his toes more has probably where this came from. With that being said there does not appear to be signs of active infection which is great news. 12/17/2020 upon evaluation today patient actually appears to be making good progress currently. Fortunately there is no evidence of active infection at this time. Overall I feel like he is very close to complete closure. 12/26/2020 upon evaluation today patient appears to be doing excellent in regard to his wounds. In fact I am not certain that these are not even completely healed on initial inspection. Overall I am very pleased with where things stand today. Good news is that they are healed he is actually get ready to go out of town and that will be helpful as well as he will be a full part of the time. [name], [name] ([id number]) [id number] Page 3 of 11 Readmission: 02/04/2021 upon evaluation today patient appears to be doing well at this point in  regard to his left heel that I previously saw him for. Unfortunately he is having issues with his right lower extremity. He has significant  wounds at this point he also has erythema noted there is definite signs of cellulitis which is unfortunate. With that being said I think we do need to address this sooner rather than later. The good news is he did have a nice trip to the beach. He tells me that he had no issues during that time. 4/27; patient on Bactrim. Culture showed methicillin sensitive staph aureus therefore the Bactrim should be effective. 2 small areas on the right leg are healed the area on the mid aspect of the tibia almost 100% covered in a very adherent necrotic debris. We have been using silver alginate 02/20/2021 upon evaluation today patient appears to be doing well with regard to his wound. He is showing signs of improvement which is great news overall very pleased with where things stand today. No fevers, chills, nausea, vomiting, or diarrhea. 02/27/2021 upon evaluation today patient appears to be doing well with regard to his leg ulcer. He is tolerating the dressing changes and overall appears to be doing quite excellent. I am extremely pleased with where things stand and overall I think patient is making great progress. There is no sign of active infection at this time which is also great news. 03/06/2021 upon evaluation today patient appears to be doing excellent in regard to his wounds. In fact he appears to be completely healed today based on what I am seeing. This is excellent news and overall I am extremely pleased with where he stands. Overall the patient is happy to hear this as well this has been quite sometime coming. Readmission: 04/01/2021 patient unfortunately returns for readmission today. He tells me that he has been wearing his compression socks on the right leg daily and he does not really know what is going on and why his legs are doing what they are doing. We did therefore go ahead and probe deeper into exactly what has been going on with him today. Subsequently the patient tells me that when he gets up  and what we would call "first thing in the morning" are really 2 different things". He does not tend to sleep well so he tells me that he will often wake up at 3:00 in the morning. He will then potentially going into the living room to get in his chair where he may read a book for a little while and then potentially fall asleep back in his chair. He then subsequently wake up around 5:00 or so and then get up and in his words "putter around". This often will end with him proceeding at some point around 8 AM or 9 AM to putting on his compression socks. With that being said this means that anywhere from the 3:00 in the morning till roughly around 9:00 in the morning he has no compression on yet he is sitting in his recliner, walking around and up and about, and this is at least 5 to 6 hours of noncompressed time. That may be our issue here but I did not realize until we discussed this further today. Fortunately there does not appear to be any signs of active infection at this time which is great news. With that being said he has multiple wounds of the bilateral lower extremities. 04/10/2021 upon evaluation today patient appears to be doing well with regard to his legs for the most part. He does look  like he had some injury where his wrap slid down nonetheless he did some "doctoring on them". I do feel like most of the areas honestly have cleared back up which is great news. There does not appear to be any signs of active infection at this time which is also great news. No fevers, chills, nausea, vomiting, or diarrhea. 04/18/2021 upon evaluation today patient appears to be doing well with regard to his wounds in fact he appears to be completely healed which is great news. Fortunately there does not appear to be any signs of active infection which is great news. No fevers, chills, nausea, vomiting, or diarrhea. READMISSION 07/28/2021 This is a now 76 year old man we have had in this clinic for quite a bit of  this year. 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 He wears compression stockings. 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 He comes in an old running shoes although he says he wears better shoes at home. His history is that he noticed a blister in the right posterior heel. This open. He has been applying Neosporin to it currently the wound measures 2 x 1.4 cm. 100% slough covered. Not really offloading this in any rigorous way. Past medical history is essentially unchanged he has paroxysmal atrial fibrillation on Coumadin type 2 diabetes with a recent hemoglobin A1c of 7 on metformin. ABI in our clinic was 0.98 on the right 08/05/2021 upon evaluation today patient appears to be doing well with regard to his heel ulcer. Fortunately there does not appear to be any signs of active infection at this time. Overall I been very pleased with where things seem to be currently as far as the wound healing is concerned. Again this is first a lot of seeing him Dr. Dellia Nims readmitted him last week. Nonetheless I think we are definitely making some progress here. 08/11/2021 upon evaluation today patient's wound on the heel actually showing signs of excellent improvement. I am actually very pleased with where things stand currently. No fevers, chills, nausea, vomiting, or diarrhea. There does not appear to be any need for sharp debridement today either which is also great news. 08/25/2021 upon evaluation today patient appears to be doing well with regard to his heel ulcer. This is actually looking significantly improved compared to last time I saw him. Fortunately there does not appear to be any evidence of active infection at this time. No fevers, chills, nausea, vomiting, or diarrhea. 09/01/2021 upon evaluation today patient appears to be doing decently well in regard to his wounds. Fortunately there  does not appear to be any signs of active infection at this time which is great news and overall very pleased in that regard. I do not see any evidence of active infection systemically which is great news as well. No fevers, chills, nausea, vomiting, or diarrhea. I think that the heel is making excellent progress. 09/15/2021 upon evaluation today patient's heel unfortunately is significantly worse compared to what it was previous. I do believe that at this time he would benefit from switching back to something a little bit more offloading from the cushion shoe he has right now this is more of a heel protector what I really need is a Prevalon offloading boot which I think is good to do a lot better for him than just the small heel protector. 09/23/2021 upon evaluation today patient appears to be doing a little better in  regards to last week's visit. Overall I think that he is making good progress which is great news and there does not appear to be any evidence of active infection at this time. No fevers, chills, nausea, vomiting, or diarrhea. 09/30/2021 upon evaluation patient's heel is actually showing signs of good improvement which is great news. Fortunately there does not appear to be any evidence of active infection locally nor systemically at this point. No fevers, chills, nausea, vomiting, or diarrhea. 10/07/2021 upon evaluation today patient appears to be doing well currently in regard to his heel ulcer. He has been tolerating the dressing changes without complication. Fortunately I do not see any signs of active infection at this time which is great news. No fevers, chills, nausea, vomiting, or diarrhea. 12/27; wound is measuring smaller. We have been using silver alginate 10/28/2021 upon evaluation today patient appears to be doing well currently in regard to his heel ulcer. I am actually very pleased with where things stand and I think he is making excellent progress. Fortunately I do not see  any signs of active infection locally nor systemically at this point which is great news. Nonetheless I do believe that the patient would benefit from a new offloading shoe and has been using it Velcro is no longer functioning properly and he tells me that he almost tripped and fell because of that today. Obviously I do not want him following that would be very bad. 11/11/2021 upon evaluation today patient appears to be doing excellent in regard to his wound. In fact this appears to be completely healed based on what I see [name], [name] ([id number]) [id number] Page 4 of 11 currently. I do not see any signs of anything open or draining and I did double check just by clearing away some of the callus around the edges of the wound to ensure that there was nothing still open or hiding underneath. Once this was cleared away it appeared that the patient was doing significantly better at this time which is great news and there was nothing actually open. Readmission: 06-19-2022 upon evaluation today patient appears to be doing well currently in regard to his heel ulcer all things considered. He unfortunately is having a bit of breakdown in general as far as the heel is concerned not nearly as bad as what we noted last time he was here in the clinic. The good news is I do think that this should hopefully heal much more rapidly than what we noted last time. His past medical history has not changed he is on Coumadin. 07-03-2022 upon evaluation today patient appears to be doing better in regard to his heel. We will start to see some improvement here which is good news. Fortunately I do not see any evidence of infection locally or systemically at this time which is great news. No fevers, chills, nausea, vomiting, or diarrhea. 07-10-2022 upon evaluation today patient appears to be doing well currently in regard to his wound from the callus standpoint around the edges. Unfortunately from an  actual wound bed standpoint he has some deep tissue injury noted at this point. Again I am not sure exactly what is going on that is causing this pressure to the region but he is definitely gotten pressure that is occurring and causing this to not heal as effectively as what we would like to see. I discussed with him today that he may need to at night when he sleeping use a pillow up underneath his calf in order  to prevent the heel from touching the bed at all. I also think that it may be beneficial for him to monitor throughout his day and make sure there is no other time when he is getting the pressure to the heel. 07-23-2022 upon evaluation today patient appears to be doing poorly currently in regard to his heel ulcer. He has been tolerating the dressing changes without complication. Unfortunately I feel like he may be infected based on what I am seeing. 07-28-2022 I did review patient's culture results which showed positive for Proteus as well as Staphylococcus both of which would be treated with the Bactrim DS that I gave him at the last visit. This is good news and hopefully means that we should be able to apply the cast today as long as the wound itself does not appear to be doing poorly. Patient's wound bed actually showed signs of doing quite well and I am very pleased with where things stand and I do not see any signs of worsening overall which is great news. 08-04-2022 upon evaluation today patient appears to be doing well currently in regard to his heel ulcer. I am actually very pleased with where things stand and I do think this is looking significantly better. With regard to his right shin that is also showing signs of improvement which is great news. 08-10-2022 upon evaluation today patient's wound on the heel actually appears to be doing significantly better. In regard to the right anterior shin this is showing signs of healing it might even be completely healed but I still get a monitor  I cannot get anything to fill away but also could not see where there was anything actually draining at this point. I am tending to think healed but I want a monitor 1 more week before I call it for sure. 08-17-2022 upon evaluation today patient appears to be doing well currently in regard to his heel. Fortunately he is tolerating the dressing changes without complication. I do not see any signs of infection and overall I think he is making good progress here. We did get approval for the Apligraf but I think he had a $295 co-pay that would have to be paid per application. With that being said as good as he is doing right now I do not think that is necessary but is still an option depending on how things progress if we need to speed this up quite a bit. 08/24/2022; this patient has a wound on his left heel in the setting of type 2 diabetes. We have been using Prisma. He has a heel offloading boot 08-31-2022 upon evaluation today patient appears to be doing poorly in regard to his heel ulcer which is still showing signs of bruising I am just not as happy as I was when I saw him 2 weeks ago with this. He saw Dr. Dellia Nims last week. Also did not look good at that point apparently. Nonetheless I think that the patient is still continuing to have some counterpressure getting to the wound bed unfortunately. 09-07-2022 upon evaluation today patient appears to be doing well currently in regard to his heel ulcer. He has been tolerating the dressing changes without complication. Fortunately there does not appear to be any signs of active infection locally or systemically at this time. Fortunately I do not see any evidence of active infection at this time. 09-15-2022 upon evaluation today patient appears to be doing well currently in regard to his legs which in fact appear to  be completely healed this is great news. With that being said his heel does have some erythema and warmth as well as drainage I am concerned  about cellulitis at this location. 09-21-2022 upon evaluation today patient's wound is actually showing signs of being a little bit smaller but still we are not doing nearly as well as what I would like to see. Fortunately I do not see any signs of infection at this time which is great news and overall I am extremely pleased in that regard. With that being said I do think that he needs to continue to elevate his leg although the edema is under great control with a compression wrap I think switching to a different dressing topically may be beneficial. My suggestion is to switch him over to a Iodoflex dressing. 09-28-2022 upon evaluation today patient appears to be doing well with regard to his heel ulcer. This is actually showing signs of improvement which is great news and overall I am extremely pleased with where we stand today. Electronic Signature(s) Signed: 09/28/2022 2:31:18 PM By: Worthy Keeler PA-C Entered By: Worthy Keeler on 09/28/2022 14:31:18 -------------------------------------------------------------------------------- Physical Exam Details Patient Name: Date of Service: [name], [name] [name] 09/28/2022 1:30 PM Medical Record Number: [id number] Patient Account Number: [id number] Date of Birth/Sex: Treating RN: Dec 06, 1945 (76 y.o. [name] Primary Care Provider: Fulton Reek Other Clinician: Massie Kluver Referring Provider: Treating Provider/Extender: [name] in Treatment: [address] ([id number]) [id number] Page 5 of 11 Constitutional Well-nourished and well-hydrated in no acute distress. Respiratory normal breathing without difficulty. Psychiatric this patient is able to make decisions and demonstrates good insight into disease process. Alert and Oriented x 3. pleasant and cooperative. Notes Upon inspection patient's wound bed showed signs of good granulation epithelization at this point.  Fortunately I see no evidence of active infection locally nor systemically which is great news and overall I do think we are headed in the right direction here. Electronic Signature(s) Signed: 09/28/2022 2:31:38 PM By: Worthy Keeler PA-C Entered By: Worthy Keeler on 09/28/2022 14:31:38 -------------------------------------------------------------------------------- Physician Orders Details Patient Name: Date of Service: [name], [name] [name] 09/28/2022 1:30 PM Medical Record Number: [id number] Patient Account Number: [id number] Date of Birth/Sex: Treating RN: 25-Sep-1946 (76 y.o. 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 Bathing/ Shower/ Hygiene May shower; gently cleanse wound with antibacterial soap, rinse and pat dry prior to dressing wounds Edema Control - Lymphedema / Segmental Compressive Device / Other 3 Layer Compression System for Lymphedema. - left lower leg Non-Wound Condition dditional non-wound orders/instructions: - Apply 40% Urea cream to dry cracked skin on feet. A Off-Loading Open toe surgical shoe with peg assist. Medications-Please add to medication list. ntibiotics - continue Bactrim P.O. A Wound Treatment Wound #10 - Calcaneus Wound Laterality: Left Prim Dressing: IODOFLEX 0.9% Cadexomer Iodine Pad 3 x Per Week/30 Days ary Discharge Instructions: Apply Iodoflex to wound bed only as directed. [name], [name] ([id number]) [id number] Page 6 of  11 Secondary Dressing: Zetuvit Plus 4x8 (in/in) 3 x Per Week/30 Days Compression Wrap: 3-LAYER  WRAP - Profore Lite LF 3 Multilayer Compression Bandaging System 3 x Per Week/30 Days Discharge Instructions: Apply 3 multi-layer wrap as prescribed. Electronic Signature(s) Signed: 09/28/2022 5:07:14 PM By: Worthy Keeler PA-C Signed: 10/06/2022 4:45:28 PM By: Massie Kluver Entered By: Massie Kluver on 09/28/2022 14:16:44 -------------------------------------------------------------------------------- Problem List Details Patient Name: Date of Service: [name], [name] [name] 09/28/2022 1:30 PM Medical Record Number: [id number] Patient Account Number: [id number] Date of Birth/Sex: Treating RN: May 07, 1946 (76 y.o. [name] Primary Care Provider: Fulton Reek Other Clinician: Massie Kluver Referring Provider: Treating Provider/Extender: Maurilio Lovely Weeks in Treatment: 14 Active Problems ICD-10 Encounter Code Description Active Date MDM Diagnosis E11.621 Type 2 diabetes mellitus with foot ulcer 06/19/2022 No Yes 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sed 06/19/2022 No Yes E11.42 Type 2 diabetes mellitus with diabetic polyneuropathy 06/19/2022 No Yes Inactive Problems Resolved Problems Electronic Signature(s) Signed: 09/28/2022 1:20:39 PM By: Worthy Keeler PA-C Entered By: Worthy Keeler on 09/28/2022 13:20:39 [name] ([id number]) [id number] Page 7 of 11 -------------------------------------------------------------------------------- Progress Note Details Patient Name: Date of Service: [name], [name] [name] 09/28/2022 1:30 PM Medical Record Number: [id number] Patient Account Number: [id number] Date of Birth/Sex: Treating RN: June 02, 1946 (76 y.o. [name] Primary Care Provider: Fulton Reek Other Clinician: Massie Kluver Referring Provider: Treating Provider/Extender: Maurilio Lovely Weeks in Treatment: 14 Subjective Chief Complaint Information obtained from Patient Left heel ulcer History of Present Illness (HPI) 09/24/2020 on evaluation today patient presents today for a heel ulcer that he tells me has been present for about 2 years. He has been seeing podiatry and they have been attempting to manage this including what sounds to be a total contact cast, Unna boot, and just standard dressings otherwise as well. Most recently has been using triple antibiotic ointment. With that being said unfortunately despite everything he really has not had any significant improvement. He tells me that he cannot even really remember exactly how this began but he presumed it may have rubbed on his shoes or something of that nature. 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 He tells me that this does seem to help. Fortunately there is no signs of active infection at this time. His most recent hemoglobin A1c was 8.1 though he knows this was this year he cannot tell me the exact time. His fluid pills currently to help with some of the lower extremity edema although he does obviously have signs of venous stasis/lymphedema. Currently there is no evidence of active infection. No fevers, chills, nausea, vomiting, or diarrhea. Patient has had fairly recent ABIs which were performed on 07/19/2020 and revealed that he has normal findings in both the ankle and toe locations bilaterally. His ABI on the right was 1.09 on the left was 1.08 with a TBI on the right of 0.88 and on the left of 0.94. Triphasic flow was noted throughout. 10/08/2020 on evaluation today patient appears to be doing pretty well in  regard to his left heel currently in fact this is doing a great job and seems to be healing quite nicely. Unfortunately on his right leg he had a pile of wood that  actually fell on him injuring his right leg this is somewhat erythematous has me concerned little bit about cellulitis though there is not really a good area to culture at this point. 10/24/2020 upon evaluation today patient appears to be doing well with regard to his heel ulcer. He is showing signs of improvement which is great news. His right leg is completely healed. Overall I feel like he is doing excellent and there is no signs of infection. 11/07/2020 upon evaluation today patient appears to be doing well with regard to his heel ulcer. He tells me that last week when he was unable to come in his wife actually thought that the wound was very close to closing if not closed. Then it began to "reopen again". I really feel like what may have happened as the collagen may have dried over the wound bed and that because that misunderstanding with thinking that the wound was healing. With that being said I did not see it last week I do not know that for certain. Either way I feel like he is doing great today I see no signs of infection at this point. 11/26/2020 upon inspection today patient appears to be doing decently well in regard to his heel ulcer. He has been tolerating the dressing changes without complication. Fortunately there is no sign of active infection at this time. No fevers, chills, nausea, vomiting, or diarrhea. 12/10/2020 upon evaluation today patient appears to be doing fairly well in regard to the wound on his heel as well as what appears to be a new wound of the left first metatarsal head plantar aspect. This seems to be an area that was callus that has split as the patient tells me has been trying to walk on his toes more has probably where this came from. With that being said there does not appear to be signs of active infection  which is great news. 12/17/2020 upon evaluation today patient actually appears to be making good progress currently. Fortunately there is no evidence of active infection at this time. Overall I feel like he is very close to complete closure. 12/26/2020 upon evaluation today patient appears to be doing excellent in regard to his wounds. In fact I am not certain that these are not even completely healed on initial inspection. Overall I am very pleased with where things stand today. Good news is that they are healed he is actually get ready to go out of town and that will be helpful as well as he will be a full part of the time. Readmission: 02/04/2021 upon evaluation today patient appears to be doing well at this point in regard to his left heel that I previously saw him for. Unfortunately he is having issues with his right lower extremity. He has significant wounds at this point he also has erythema noted there is definite signs of cellulitis which is unfortunate. With that being said I think we do need to address this sooner rather than later. The good news is he did have a nice trip to the beach. He tells me that he had no issues during that time. 4/27; patient on Bactrim. Culture showed methicillin sensitive staph aureus therefore the Bactrim should be effective. 2 small areas on the right leg are healed the area on the mid aspect of the tibia almost 100% covered in a very adherent necrotic debris. We have been using silver alginate 02/20/2021 upon evaluation today patient appears to be doing well with regard to his wound. He  is showing signs of improvement which is great news overall very pleased with where things stand today. No fevers, chills, nausea, vomiting, or diarrhea. 02/27/2021 upon evaluation today patient appears to be doing well with regard to his leg ulcer. He is tolerating the dressing changes and overall appears to be doing quite excellent. I am extremely pleased with where things stand  and overall I think patient is making great progress. There is no sign of active infection at this time which is also great news. 03/06/2021 upon evaluation today patient appears to be doing excellent in regard to his wounds. In fact he appears to be completely healed today based on what I am seeing. This is excellent news and overall I am extremely pleased with where he stands. Overall the patient is happy to hear this as well this has been quite sometime coming. [name], [name] ([id number]) [id number] Page 8 of 11 Readmission: 04/01/2021 patient unfortunately returns for readmission today. He tells me that he has been wearing his compression socks on the right leg daily and he does not really know what is going on and why his legs are doing what they are doing. We did therefore go ahead and probe deeper into exactly what has been going on with him today. Subsequently the patient tells me that when he gets up and what we would call "first thing in the morning" are really 2 different things". He does not tend to sleep well so he tells me that he will often wake up at 3:00 in the morning. He will then potentially going into the living room to get in his chair where he may read a book for a little while and then potentially fall asleep back in his chair. He then subsequently wake up around 5:00 or so and then get up and in his words "putter around". This often will end with him proceeding at some point around 8 AM or 9 AM to putting on his compression socks. With that being said this means that anywhere from the 3:00 in the morning till roughly around 9:00 in the morning he has no compression on yet he is sitting in his recliner, walking around and up and about, and this is at least 5 to 6 hours of noncompressed time. That may be our issue here but I did not realize until we discussed this further today. Fortunately there does not appear to be any signs of active infection at  this time which is great news. With that being said he has multiple wounds of the bilateral lower extremities. 04/10/2021 upon evaluation today patient appears to be doing well with regard to his legs for the most part. He does look like he had some injury where his wrap slid down nonetheless he did some "doctoring on them". I do feel like most of the areas honestly have cleared back up which is great news. There does not appear to be any signs of active infection at this time which is also great news. No fevers, chills, nausea, vomiting, or diarrhea. 04/18/2021 upon evaluation today patient appears to be doing well with regard to his wounds in fact he appears to be completely healed which is great news. Fortunately there does not appear to be any signs of active infection which is great news. No fevers, chills, nausea, vomiting, or diarrhea. READMISSION 07/28/2021 This is a now 76 year old man we have had in this clinic for quite a bit of this year. He has been in here with bilateral  lower extremity leg wounds probably secondary to chronic venous insufficiency. He wears compression stockings. 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 He comes in an old running shoes although he says he wears better shoes at home. His history is that he noticed a blister in the right posterior heel. This open. He has been applying Neosporin to it currently the wound measures 2 x 1.4 cm. 100% slough covered. Not really offloading this in any rigorous way. Past medical history is essentially unchanged he has paroxysmal atrial fibrillation on Coumadin type 2 diabetes with a recent hemoglobin A1c of 7 on metformin. ABI in our clinic was 0.98 on the right 08/05/2021 upon evaluation today patient appears to be doing well with regard to his heel ulcer. Fortunately there does not appear to be any signs of active infection at this time. Overall I been very pleased with where things seem  to be currently as far as the wound healing is concerned. Again this is first a lot of seeing him Dr. Dellia Nims readmitted him last week. Nonetheless I think we are definitely making some progress here. 08/11/2021 upon evaluation today patient's wound on the heel actually showing signs of excellent improvement. I am actually very pleased with where things stand currently. No fevers, chills, nausea, vomiting, or diarrhea. There does not appear to be any need for sharp debridement today either which is also great news. 08/25/2021 upon evaluation today patient appears to be doing well with regard to his heel ulcer. This is actually looking significantly improved compared to last time I saw him. Fortunately there does not appear to be any evidence of active infection at this time. No fevers, chills, nausea, vomiting, or diarrhea. 09/01/2021 upon evaluation today patient appears to be doing decently well in regard to his wounds. Fortunately there does not appear to be any signs of active infection at this time which is great news and overall very pleased in that regard. I do not see any evidence of active infection systemically which is great news as well. No fevers, chills, nausea, vomiting, or diarrhea. I think that the heel is making excellent progress. 09/15/2021 upon evaluation today patient's heel unfortunately is significantly worse compared to what it was previous. I do believe that at this time he would benefit from switching back to something a little bit more offloading from the cushion shoe he has right now this is more of a heel protector what I really need is a Prevalon offloading boot which I think is good to do a lot better for him than just the small heel protector. 09/23/2021 upon evaluation today patient appears to be doing a little better in regards to last week's visit. Overall I think that he is making good progress which is great news and there does not appear to be any evidence of active  infection at this time. No fevers, chills, nausea, vomiting, or diarrhea. 09/30/2021 upon evaluation patient's heel is actually showing signs of good improvement which is great news. Fortunately there does not appear to be any evidence of active infection locally nor systemically at this point. No fevers, chills, nausea, vomiting, or diarrhea. 10/07/2021 upon evaluation today patient appears to be doing well currently in regard to his heel ulcer. He has been tolerating the dressing changes without complication. Fortunately I do not see any signs of active infection at this time which is great news. No fevers, chills, nausea, vomiting, or diarrhea. 12/27; wound is measuring smaller. We have  been using silver alginate 10/28/2021 upon evaluation today patient appears to be doing well currently in regard to his heel ulcer. I am actually very pleased with where things stand and I think he is making excellent progress. Fortunately I do not see any signs of active infection locally nor systemically at this point which is great news. Nonetheless I do believe that the patient would benefit from a new offloading shoe and has been using it Velcro is no longer functioning properly and he tells me that he almost tripped and fell because of that today. Obviously I do not want him following that would be very bad. 11/11/2021 upon evaluation today patient appears to be doing excellent in regard to his wound. In fact this appears to be completely healed based on what I see currently. I do not see any signs of anything open or draining and I did double check just by clearing away some of the callus around the edges of the wound to ensure that there was nothing still open or hiding underneath. Once this was cleared away it appeared that the patient was doing significantly better at this time which is great news and there was nothing actually open. Readmission: 06-19-2022 upon evaluation today patient appears to be doing  well currently in regard to his heel ulcer all things considered. He unfortunately is having a bit of breakdown in general as far as the heel is concerned not nearly as bad as what we noted last time he was here in the clinic. The good news is I do think that this should hopefully heal much more rapidly than what we noted last time. His past medical history has not changed he is on Coumadin. 07-03-2022 upon evaluation today patient appears to be doing better in regard to his heel. We will start to see some improvement here which is good news. Fortunately I do not see any evidence of infection locally or systemically at this time which is great news. No fevers, chills, nausea, vomiting, or diarrhea. 07-10-2022 upon evaluation today patient appears to be doing well currently in regard to his wound from the callus standpoint around the edges. Unfortunately from an actual wound bed standpoint he has some deep tissue injury noted at this point. Again I am not sure exactly what is going on that is causing this pressure to the region but he is definitely gotten pressure that is occurring and causing this to not heal as effectively as what we would like to see. I discussed with him today that he may need to at night when he sleeping use a pillow up underneath his calf in order to prevent the heel from touching the bed at all. I also think that it may be beneficial for him to monitor throughout his day and make sure there is no other time when he is getting the pressure to the heel. 07-23-2022 upon evaluation today patient appears to be doing poorly currently in regard to his heel ulcer. He has been tolerating the dressing changes without complication. Unfortunately I feel like he may be infected based on what I am seeing. [name], [name] ([id number]) [id number] Page 9 of 11 07-28-2022 I did review patient's culture results which showed positive for Proteus as well as Staphylococcus  both of which would be treated with the Bactrim DS that I gave him at the last visit. This is good news and hopefully means that we should be able to apply the cast today as long as the wound  itself does not appear to be doing poorly. Patient's wound bed actually showed signs of doing quite well and I am very pleased with where things stand and I do not see any signs of worsening overall which is great news. 08-04-2022 upon evaluation today patient appears to be doing well currently in regard to his heel ulcer. I am actually very pleased with where things stand and I do think this is looking significantly better. With regard to his right shin that is also showing signs of improvement which is great news. 08-10-2022 upon evaluation today patient's wound on the heel actually appears to be doing significantly better. In regard to the right anterior shin this is showing signs of healing it might even be completely healed but I still get a monitor I cannot get anything to fill away but also could not see where there was anything actually draining at this point. I am tending to think healed but I want a monitor 1 more week before I call it for sure. 08-17-2022 upon evaluation today patient appears to be doing well currently in regard to his heel. Fortunately he is tolerating the dressing changes without complication. I do not see any signs of infection and overall I think he is making good progress here. We did get approval for the Apligraf but I think he had a $295 co-pay that would have to be paid per application. With that being said as good as he is doing right now I do not think that is necessary but is still an option depending on how things progress if we need to speed this up quite a bit. 08/24/2022; this patient has a wound on his left heel in the setting of type 2 diabetes. We have been using Prisma. He has a heel offloading boot 08-31-2022 upon evaluation today patient appears to be doing poorly in  regard to his heel ulcer which is still showing signs of bruising I am just not as happy as I was when I saw him 2 weeks ago with this. He saw Dr. Dellia Nims last week. Also did not look good at that point apparently. Nonetheless I think that the patient is still continuing to have some counterpressure getting to the wound bed unfortunately. 09-07-2022 upon evaluation today patient appears to be doing well currently in regard to his heel ulcer. He has been tolerating the dressing changes without complication. Fortunately there does not appear to be any signs of active infection locally or systemically at this time. Fortunately I do not see any evidence of active infection at this time. 09-15-2022 upon evaluation today patient appears to be doing well currently in regard to his legs which in fact appear to be completely healed this is great news. With that being said his heel does have some erythema and warmth as well as drainage I am concerned about cellulitis at this location. 09-21-2022 upon evaluation today patient's wound is actually showing signs of being a little bit smaller but still we are not doing nearly as well as what I would like to see. Fortunately I do not see any signs of infection at this time which is great news and overall I am extremely pleased in that regard. With that being said I do think that he needs to continue to elevate his leg although the edema is under great control with a compression wrap I think switching to a different dressing topically may be beneficial. My suggestion is to switch him over to a Iodoflex dressing. 09-28-2022  upon evaluation today patient appears to be doing well with regard to his heel ulcer. This is actually showing signs of improvement which is great news and overall I am extremely pleased with where we stand today. Objective Constitutional Well-nourished and well-hydrated in no acute distress. Vitals Time Taken: 1:26 PM, Height: 70 in, Weight: 265  lbs, BMI: 38, Temperature: 98.6 F, Pulse: 56 bpm, Respiratory Rate: 18 breaths/min, Blood Pressure: 168/71 mmHg. Respiratory normal breathing without difficulty. Psychiatric this patient is able to make decisions and demonstrates good insight into disease process. Alert and Oriented x 3. pleasant and cooperative. General Notes: Upon inspection patient's wound bed showed signs of good granulation epithelization at this point. Fortunately I see no evidence of active infection locally nor systemically which is great news and overall I do think we are headed in the right direction here. Integumentary (Hair, Skin) Wound #10 status is Open. Original cause of wound was Gradually Appeared. The date acquired was: 06/05/2022. The wound has been in treatment 14 weeks. The wound is located on the Left Calcaneus. The wound measures 1.2cm length x 2.1cm width x 0.2cm depth; 1.979cm^2 area and 0.396cm^3 volume. There is Fat Layer (Subcutaneous Tissue) exposed. There is no tunneling or undermining noted. There is a medium amount of serosanguineous drainage noted. The wound margin is flat and intact. There is small (1-33%) pink granulation within the wound bed. There is a large (67-100%) amount of necrotic tissue within the wound bed including Adherent Slough. 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 There was a Excisional Skin/Subcutaneous Tissue Debridement with a total area of 6 sq cm performed by Tommie Sams., PA-C. With the following instrument(s): Curette to  remove Viable and Non-Viable tissue/material. Material removed includes Callus, Subcutaneous Tissue, and Slough. A time out was conducted at 14:11, prior to the start of the procedure. A Minimum amount of bleeding was controlled with Pressure. The procedure was tolerated well. Post Debridement Measurements: 1.2cm length x 2.1cm width x 0.2cm depth; 0.396cm^3 volume. Character of Wound/Ulcer Post Debridement is stable. Severity of Tissue Post Debridement is: Fat layer exposed. 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 There was a Three Layer Compression Therapy Procedure with a pre-treatment ABI of 1 by Massie Kluver. Post procedure Diagnosis Wound #10: Same as Pre-Procedure Plan Follow-up Appointments: Wound #10 Left Calcaneus: Return Appointment in 1 week. Bathing/ Shower/ Hygiene: May shower; gently cleanse wound with antibacterial soap, rinse and pat dry prior to dressing wounds Edema Control - Lymphedema / Segmental Compressive Device / Other: 3 Layer Compression System for Lymphedema. - left lower leg Non-Wound Condition: Additional non-wound orders/instructions: - Apply 40% Urea cream to dry cracked skin on feet. Off-Loading: Open toe surgical shoe with peg assist. Medications-Please add to medication list.: P.O. Antibiotics - continue Bactrim WOUND #10: - Calcaneus Wound Laterality: Left Prim Dressing: IODOFLEX 0.9% Cadexomer Iodine Pad 3 x Per Week/30 Days ary Discharge Instructions: Apply Iodoflex to wound bed only as directed. Secondary Dressing: Zetuvit Plus 4x8 (in/in) 3 x Per Week/30 Days  Com pression Wrap: 3-LAYER WRAP - Profore Lite LF 3 Multilayer Compression Bandaging System 3 x Per Week/30 Days Discharge Instructions: Apply 3 multi-layer wrap as prescribed. 1. I am going to suggest that we have him continue with the Iodoflex which I  think is doing an awesome job. 2. Also can recommend that we have the patient continue with the 3 layer compression wrap which I think is keeping the edema under good control. 3. I would also suggest that he continue to elevate the legs much as possible to help with edema control. We will see patient back for reevaluation in 1 week here in the clinic. If anything worsens or changes patient will contact our office for additional recommendations. Electronic Signature(s) Signed: 09/28/2022 2:31:57 PM By: Worthy Keeler PA-C Entered By: Worthy Keeler on 09/28/2022 14:31:57 -------------------------------------------------------------------------------- SuperBill Details Patient Name: Date of Service: [name], [name] [name] 09/28/2022 Medical Record Number: [id number] Patient Account Number: [id number] Date of Birth/Sex: Treating RN: 13-Jul-1946 (76 y.o. [name] Primary Care Provider: Fulton Reek Other Clinician: Massie Kluver Referring Provider: Treating Provider/Extender: [name] in Treatment: [address] 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

## 2022-09-28 NOTE — Progress Notes (Addendum)
KEEVON, HENNEY ([id number]) [id number] Page 1 of 9 Visit Report for 09/28/2022 Arrival Information Details Patient Name: Date of Service: [name], [name] [facility] J. 09/28/2022 1:30 PM Medical Record Number: [id number] Patient Account Number: [id number] Date of Birth/Sex: Treating RN: 1946/09/16 (76 y.o. [name] Primary Care Leilyn Frayre: Fulton Reek Other Clinician: Massie Kluver Referring Aurorah Schlachter: Treating Jovonni Borquez/Extender: [name] in Treatment: 33 Visit Information History Since Last Visit All ordered tests and consults were completed: No Patient Arrived: [name] Added or deleted any medications: No Arrival Time: 13:24 Any new allergies or adverse reactions: No Transfer Assistance: None Had a fall or experienced change in No Patient Identification Verified: Yes activities of daily living that may affect Secondary Verification Process Completed: Yes risk of falls: Patient Requires Transmission-Based Precautions: No Signs or symptoms of abuse/neglect since last visito No Patient Has Alerts: Yes Hospitalized since last visit: No Patient Alerts: Patient on Blood Thinner Implantable device outside of the clinic excluding No Warfarin cellular tissue based products placed in the center Type II Diabetic since last visit: Has Dressing in Place as Prescribed: Yes Has Compression in Place as Prescribed: Yes Pain Present Now: No Electronic Signature(s) Signed: 10/06/2022 4:45:28 PM By: Massie Kluver Entered By: Massie Kluver on 09/28/2022 13:25:54 -------------------------------------------------------------------------------- Clinic Level of Care Assessment Details Patient Name: Date of Service: [name], [name] J. 09/28/2022 1:30 PM Medical Record Number: [id number] Patient Account Number: [id number] Date of Birth/Sex: Treating RN: 05/22/1946 (76 y.o. [name] Primary Care Davante Gerke: Fulton Reek Other  Clinician: Massie Kluver Referring Delva Derden: Treating Ethelyn Cerniglia/Extender: [name] in Treatment: 14 Clinic Level of Care Assessment Items TOOL 1 Quantity Score _0  - 0 Use when EandM and Procedure is performed on INITIAL visit ASSESSMENTS - Nursing Assessment / Reassessment _1  - 0 General Physical Exam (combine w/ comprehensive assessment (listed just below) when performed on new pt. [address] [name], [name] ([id number]) [id number] Page 2 of 9 _2  - 0 Comprehensive Assessment (HX, ROS, Risk Assessments, Wounds Hx, etc.) ASSESSMENTS - Wound and Skin Assessment / Reassessment _3  - 0 Dermatologic / Skin Assessment (not related to wound area) ASSESSMENTS - Ostomy and/or Continence Assessment and Care _4  - 0 Incontinence Assessment and Management _5  - 0 Ostomy Care Assessment and Management (repouching, etc.) PROCESS - Coordination of Care _6  - 0 Simple Patient / Family Education for ongoing care _7  - 0 Complex (extensive) Patient / Family Education for ongoing care _8  - 0 Staff obtains Programmer, systems, Records, T Results / Process Orders est _9  - 0 Staff telephones HHA, Nursing Homes / Clarify orders / etc _10  - 0 Routine Transfer to another Facility (non-emergent condition) _11  - 0 Routine Hospital Admission (non-emergent condition) _12  - 0 New Admissions / Biomedical engineer / Ordering NPWT Apligraf, etc. , _13  - 0 Emergency Hospital Admission (emergent condition) PROCESS - Special Needs _14  - 0 Pediatric / Minor Patient Management _15  - 0 Isolation Patient Management _16  - 0 Hearing / Language / Visual special needs _17  - 0 Assessment of Community assistance (transportation, D/C planning, etc.) _18  - 0 Additional assistance / Altered mentation _19  - 0 Support Surface(s) Assessment (bed, cushion, seat, etc.) INTERVENTIONS - Miscellaneous _20  - 0 External ear exam _21  - 0 Patient Transfer (multiple staff / Civil Service fast streamer /  Similar devices) _22  - 0 Simple Staple / Suture removal (25 or less) _23  - 0 Complex Staple / Suture removal (26 or more) _24  - 0 Hypo/Hyperglycemic Management (do not check if billed separately) _25  -  0 Ankle / Brachial Index (ABI) - do not check if billed separately Has the patient been seen at the hospital within the last three years: Yes Total Score: 0 Level Of Care: ____ Electronic Signature(s) Signed: 10/06/2022 4:45:28 PM By: Massie Kluver Entered By: Massie Kluver on 09/28/2022 14:16:51 -------------------------------------------------------------------------------- Compression Therapy Details Patient Name: Date of Service: [name], [name] [name] 09/28/2022 1:30 PM Medical Record Number: [id number] Patient Account Number: [id number] Date of Birth/Sex: Treating RN: 03-08-1946 (76 y.o. [name] Primary Care Brently Voorhis: Fulton Reek Other Clinician: Jaray, Boliver ([id number]) [id number] Page 3 of 9 Referring Larrell Rapozo: Treating Angelina Venard/Extender: [name] in Treatment: 14 Compression Therapy Performed for Wound Assessment: Wound #10 Left Calcaneus Performed By: Lenice Pressman, Angie, Compression Type: Three Layer Pre Treatment ABI: 1 Post Procedure Diagnosis Same as Pre-procedure Electronic Signature(s) Signed: 10/06/2022 4:45:28 PM By: Massie Kluver Entered By: Massie Kluver on 09/28/2022 14:16:20 -------------------------------------------------------------------------------- Encounter Discharge Information Details Patient Name: Date of Service: [name], [name] [name] 09/28/2022 1:30 PM Medical Record Number: [id number] Patient Account Number: [id number] Date of Birth/Sex: Treating RN: 15-May-1946 (76 y.o. [name] Primary Care Lenna Hagarty: Fulton Reek Other Clinician: Massie Kluver Referring Hadassah Rana: Treating Marv Alfrey/Extender: [name] in Treatment:  14 Encounter Discharge Information Items Post Procedure Vitals Discharge Condition: Stable Temperature (F): 98.6 Ambulatory Status: Walker Pulse (bpm): 56 Discharge Destination: Home Respiratory Rate (breaths/min): 18 Transportation: Private Auto Blood Pressure (mmHg): 168/71 Accompanied By: self Schedule Follow-up Appointment: Yes Clinical Summary of Care: Electronic Signature(s) Signed: 10/06/2022 4:45:28 PM By: Massie Kluver Entered By: Massie Kluver on 09/28/2022 17:06:33 -------------------------------------------------------------------------------- Lower Extremity Assessment Details Patient Name: Date of Service: [name], [name] [name] 09/28/2022 1:30 PM Medical Record Number: [id number] Patient Account Number: [id number] Date of Birth/Sex: Treating RN: 1946-08-27 (76 y.o. [name] Primary Care Dimitrios Balestrieri: Fulton Reek Other Clinician: Massie Kluver Referring Yanissa Michalsky: Treating Gresham Caetano/Extender: [name] in Treatment: [address] [place] J ([id number]) [id number] Page 4 of 9 Edema Assessment Assessed: [Left: Yes] [Right: No] Edema: [Left: Ye] [Right: s] Calf Left: Right: Point of Measurement: 33 cm From Medial Instep 38.4 cm Ankle Left: Right: Point of Measurement: 11 cm From Medial Instep 23.5 cm Vascular Assessment Pulses: Dorsalis Pedis Palpable: [Left:Yes] Electronic Signature(s) Signed: 09/28/2022 3:51:57 PM By: Rosalio Loud MSN RN CNS WTA Signed: 10/06/2022 4:45:28 PM By: Massie Kluver Entered By: Massie Kluver on 09/28/2022 13:40:50 -------------------------------------------------------------------------------- Multi Wound Chart Details Patient Name: Date of Service: [name], [name] [name] 09/28/2022 1:30 PM Medical Record Number: [id number] Patient Account Number: [id number] Date of Birth/Sex: Treating RN: 08-30-1946 (76 y.o. [name] Primary Care Braylyn Kalter: Fulton Reek Other  Clinician: Massie Kluver Referring Duayne Brideau: Treating Leana Springston/Extender: Maurilio Lovely Weeks in Treatment: 14 Vital Signs Height(in): 70 Pulse(bpm): 37 Weight(lbs): 265 Blood Pressure(mmHg): 168/71 Body Mass Index(BMI): 38 Temperature(F): 98.6 Respiratory Rate(breaths/min): 18 [10:Photos:] [N/A:N/A] Left Calcaneus N/A N/A Wound Location: Gradually Appeared N/A N/A Wounding 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 N/A N/A Secondary Etiology: Cataracts, Arrhythmia, Coronary N/A N/A Comorbid History: Artery Disease, Hypertension, Type II Diabetes, Osteoarthritis, Neuropathy IZAYAH, MINER ([id number]) [id number].pdf Page 5 of 9 06/05/2022 N/A N/A Date Acquired: 31 N/A N/A Weeks of Treatment: Open N/A N/A Wound Status: No N/A N/A Wound Recurrence: 1.2x2.1x0.2 N/A N/A Measurements L x W x D (cm) 1.979 N/A N/A A (cm) : rea 0.396 N/A N/A Volume (cm) : -40.00% N/A N/A %  Reduction in A rea: -39.90% N/A N/A % Reduction in Volume: Grade 1 N/A N/A Classification: Medium N/A N/A Exudate A mount: Serosanguineous N/A N/A Exudate Type: red, brown N/A N/A Exudate Color: Flat and Intact N/A N/A Wound Margin: Small (1-33%) N/A N/A Granulation A mount: Pink N/A N/A Granulation Quality: Large (67-100%) N/A N/A Necrotic A mount: Fat Layer (Subcutaneous Tissue): Yes N/A N/A Exposed Structures: Fascia: No Tendon: No Muscle: No Joint: No Bone: No Small (1-33%) N/A N/A Epithelialization: Treatment Notes Electronic Signature(s) Signed: 10/06/2022 4:45:28 PM By: Massie Kluver Entered By: Massie Kluver on 09/28/2022 13:41:02 -------------------------------------------------------------------------------- Multi-Disciplinary Care Plan Details Patient Name: Date of Service: [name], [name] [name] 09/28/2022 1:30 PM Medical Record Number: [id number] Patient Account Number:  [id number] Date of Birth/Sex: Treating RN: 1945-12-13 (76 y.o. [name] Primary Care Breely Panik: Fulton Reek Other Clinician: Massie Kluver Referring Aaliya Maultsby: Treating Vianne Grieshop/Extender: [name] in Treatment: 14 Active Inactive Pressure Nursing Diagnoses: Knowledge deficit related to causes and risk factors for pressure ulcer development Knowledge deficit related to management of pressures ulcers Potential for impaired tissue integrity related to pressure, friction, moisture, and shear Goals: Patient will remain free from development of additional pressure ulcers Date Initiated: 06/19/2022 Date Inactivated: 07/28/2022 Target Resolution Date: 06/19/2022 Goal Status: Met Patient/caregiver will verbalize risk factors for pressure ulcer development Date Initiated: 06/19/2022 Date Inactivated: 07/28/2022 Target Resolution Date: 06/19/2022 Goal Status: Met Patient/caregiver will verbalize understanding of pressure ulcer management Date Initiated: 06/19/2022 Target Resolution Date: 08/14/2022 Goal Status: Active Interventions: CRISTIANO, CAPRI ([id number]) [phone].pdf Page 6 of 9 Assess: immobility, friction, shearing, incontinence upon admission and as needed Assess offloading mechanisms upon admission and as needed Assess potential for pressure ulcer upon admission and as needed Provide education on pressure ulcers Notes: Wound/Skin Impairment Nursing Diagnoses: Impaired tissue integrity Goals: Patient/caregiver will verbalize understanding of skin care regimen Date Initiated: 06/19/2022 Date Inactivated: 07/10/2022 Target Resolution Date: 06/19/2022 Goal Status: Met Ulcer/skin breakdown will have a volume reduction of 30% by week 4 Date Initiated: 06/19/2022 Date Inactivated: 07/28/2022 Target Resolution Date: 07/17/2022 Goal Status: Unmet Unmet Reason: infection Ulcer/skin breakdown will have a volume reduction of 50% by week  8 Date Initiated: 07/28/2022 Target Resolution Date: 08/14/2022 Goal Status: Active Interventions: Assess patient/caregiver ability to obtain necessary supplies Assess patient/caregiver ability to perform ulcer/skin care regimen upon admission and as needed Assess ulceration(s) every visit Provide education on ulcer and skin care Treatment Activities: Skin care regimen initiated : 06/19/2022 Topical wound management initiated : 06/19/2022 Notes: Electronic Signature(s) Signed: 09/29/2022 4:38:24 PM By: Rosalio Loud MSN RN CNS WTA Signed: 10/06/2022 4:45:28 PM By: Massie Kluver Entered By: Massie Kluver on 09/28/2022 17:05:23 -------------------------------------------------------------------------------- Pain Assessment Details Patient Name: Date of Service: [name], [name] [name] 09/28/2022 1:30 PM Medical Record Number: [id number] Patient Account Number: [id number] Date of Birth/Sex: Treating RN: 05/27/1946 (76 y.o. [name] Primary Care Ulysess Witz: Fulton Reek Other Clinician: Massie Kluver Referring Maxene Byington: Treating Jashley Yellin/Extender: Maurilio Lovely Weeks in Treatment: 14 Active Problems Location of Pain Severity and Description of Pain Patient Has Paino No Site Locations GARNER, DULLEA ([id number]) [id number] Page 7 of 9 Pain Management and Medication Current Pain Management: Electronic Signature(s) Signed: 09/28/2022 3:51:57 PM By: Rosalio Loud MSN RN CNS WTA Signed: 10/06/2022 4:45:28 PM By: Massie Kluver Entered By: Massie Kluver on 09/28/2022 13:29:50 -------------------------------------------------------------------------------- Patient/Caregiver Education Details Patient Name: Date of Service: [name], [name] [name] 12/11/2023andnbsp1:30 PM Medical Record Number: [id number] Patient Account Number: [id number] Date  of Birth/Gender: Treating RN: [birth year] (76 y.o. [name] Primary Care Physician: Fulton Reek Other Clinician: Massie Kluver Referring Physician: Treating Physician/Extender: [name] in Treatment: 14 Education Assessment Education Provided To: Patient Education Topics Provided Wound/Skin Impairment: Handouts: Other: Continue wound care as directed Methods: Explain/Verbal Responses: State content correctly Electronic Signature(s) Signed: 10/06/2022 4:45:28 PM By: Massie Kluver Entered By: Massie Kluver on 09/28/2022 17:05:17 [name] ([id number]) [id number] Page 8 of 9 -------------------------------------------------------------------------------- Wound Assessment Details Patient Name: Date of Service: [name], [name] [name] 09/28/2022 1:30 PM Medical Record Number: [id number] Patient Account Number: [id number] Date of Birth/Sex: Treating RN: 1946/06/11 (76 y.o. [name] Primary Care Shedric Fredericks: Fulton Reek Other Clinician: Massie Kluver Referring Nichalas Coin: Treating Rhett Najera/Extender: Maurilio Lovely Weeks in Treatment: 14 Wound Status Wound Number: 10 Primary Diabetic Wound/Ulcer of the Lower Extremity Etiology: Wound Location: Left Calcaneus Secondary Pressure Ulcer Wounding Event: Gradually Appeared Etiology: Date Acquired: 06/05/2022 Wound Open Weeks Of Treatment: 14 Status: Clustered Wound: No Comorbid Cataracts, Arrhythmia, Coronary Artery Disease, Hypertension, History: Type II Diabetes, Osteoarthritis, Neuropathy Photos Wound Measurements Length: (cm) 1.2 Width: (cm) 2.1 Depth: (cm) 0.2 Area: (cm) 1.979 Volume: (cm) 0.396 % Reduction in Area: -40% % Reduction in Volume: -39.9% Epithelialization: Small (1-33%) Tunneling: No Undermining: No Wound Description Classification: Grade 1 Wound Margin: Flat and Intact Exudate Amount: Medium Exudate Type: Serosanguineous Exudate Color: red, brown Foul Odor After Cleansing: No Slough/Fibrino Yes Wound  Bed Granulation Amount: Small (1-33%) Exposed Structure Granulation Quality: Pink Fascia Exposed: No Necrotic Amount: Large (67-100%) Fat Layer (Subcutaneous Tissue) Exposed: Yes Necrotic Quality: Adherent Slough Tendon Exposed: No Muscle Exposed: No Joint Exposed: No Bone Exposed: No Electronic Signature(s) Signed: 09/28/2022 3:51:57 PM By: Rosalio Loud MSN RN CNS WTA Signed: 10/06/2022 4:45:28 PM By: Massie Kluver Entered By: Massie Kluver on 09/28/2022 13:39:44 [name] ([id number]) [id number] Page 9 of 9 -------------------------------------------------------------------------------- Vitals Details Patient Name: Date of Service: [name], [name] [name] 09/28/2022 1:30 PM Medical Record Number: [id number] Patient Account Number: [id number] Date of Birth/Sex: Treating RN: [birth year] (76 y.o. [name] Primary Care Murle Hellstrom: Fulton Reek Other Clinician: Massie Kluver Referring Felecity Lemaster: Treating Mina Carlisi/Extender: Maurilio Lovely Weeks in Treatment: 14 Vital Signs Time Taken: 13:26 Temperature (F): 98.6 Height (in): 70 Pulse (bpm): 56 Weight (lbs): 265 Respiratory Rate (breaths/min): 18 Body Mass Index (BMI): 38 Blood Pressure (mmHg): 168/71 Reference Range: 80 - 120 mg / dl Electronic Signature(s) Signed: 10/06/2022 4:45:28 PM By: Massie Kluver Entered By: Massie Kluver on 09/28/2022 13:29:45

## 2022-10-06 ENCOUNTER — Encounter: Payer: Medicare HMO | Admitting: Physician Assistant

## 2022-10-06 DIAGNOSIS — E11621 Type 2 diabetes mellitus with foot ulcer: Secondary | ICD-10-CM | POA: Diagnosis not present

## 2022-10-06 NOTE — Progress Notes (Addendum)
[name] ([id number]) [id number] Page 1 of 10 Visit Report for 10/06/2022 Chief Complaint Document Details Patient Name: Date of Service: [name], [name] [facility] J. 10/06/2022 2:15 PM Medical Record Number: [id number] Patient Account Number: [id number] Date of Birth/Sex: Treating RN: [birth year] (76 y.o. [name] Primary Care Provider: Fulton [name] Other Clinician: Massie [name] Referring Provider: Treating Provider/Extender: [name] Weeks in Treatment: 15 Information Obtained from: Patient Chief Complaint Left heel ulcer Electronic Signature(s) Signed: 10/06/2022 2:35:07 PM By: [name] Keeler PA-C Entered By: [name] on 10/06/2022 14:35:07 -------------------------------------------------------------------------------- HPI Details Patient Name: Date of Service: [name], [name] [name] 10/06/2022 2:15 PM Medical Record Number: [id number] Patient Account Number: [id number] Date of Birth/Sex: Treating RN: Feb 06, 1946 (76 y.o. [name] Primary Care Provider: Fulton [name] Other Clinician: Massie [name] Referring Provider: Treating Provider/Extender: [name] Weeks in Treatment: 15 History of Present Illness HPI Description: 09/24/2020 on evaluation today patient presents today for a heel ulcer that he tells me has been present for about 2 years. He has been seeing podiatry and they have been attempting to manage this including what sounds to be a total contact cast, Unna boot, and just standard dressings otherwise as well. Most recently has been using triple antibiotic ointment. With that being said unfortunately despite everything he really has not had any significant improvement. He tells me that he cannot even really remember exactly how this began but he presumed it may have rubbed on his shoes or something of that nature. 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 He tells me that this does seem to help. Fortunately there is no signs of active infection at this time. His most recent hemoglobin A1c was 8.1 though he knows this was this year he cannot tell me the exact time. His fluid pills currently to help with some of the lower extremity edema although he does obviously have signs of venous stasis/lymphedema. Currently there is no evidence of active infection. No fevers, chills, nausea, vomiting, or diarrhea. Patient has had fairly recent ABIs which were performed on 07/19/2020 and revealed that he has normal findings in both the ankle and toe locations bilaterally. His ABI on the right was 1.09 on the left was 1.08 with a TBI on the right of 0.88 and on the left of 0.94. Triphasic flow was noted throughout. 10/08/2020 on evaluation today patient appears to be doing pretty well in regard to his left heel currently in fact this is doing a great job and seems to be healing quite nicely. Unfortunately on his right leg he had a pile of wood that actually fell on him injuring his right leg this is somewhat erythematous has me concerned little bit about cellulitis though there is not really a good area to culture at this point. [name] ([id number]) [id number] Page 2 of 10 10/24/2020 upon evaluation today patient appears to be doing well with regard to his heel ulcer. He is showing signs of improvement which is great news. His right leg is completely healed. Overall I feel like he is doing excellent and there is no signs of infection. 11/07/2020 upon evaluation today patient appears to be doing well with  regard to his heel ulcer. He tells me that last week when he was unable to  come in his wife actually thought that the wound was very close to closing if not closed. Then it began to "reopen again". I really feel like what may have happened as the collagen may have dried over the wound bed and that because that misunderstanding with thinking that the wound was healing. With that being said I did not see it last week I do not know that for certain. Either way I feel like he is doing great today I see no signs of infection at this point. 11/26/2020 upon inspection today patient appears to be doing decently well in regard to his heel ulcer. He has been tolerating the dressing changes without complication. Fortunately there is no sign of active infection at this time. No fevers, chills, nausea, vomiting, or diarrhea. 12/10/2020 upon evaluation today patient appears to be doing fairly well in regard to the wound on his heel as well as what appears to be a new wound of the left first metatarsal head plantar aspect. This seems to be an area that was callus that has split as the patient tells me has been trying to walk on his toes more has probably where this came from. With that being said there does not appear to be signs of active infection which is great news. 12/17/2020 upon evaluation today patient actually appears to be making good progress currently. Fortunately there is no evidence of active infection at this time. Overall I feel like he is very close to complete closure. 12/26/2020 upon evaluation today patient appears to be doing excellent in regard to his wounds. In fact I am not certain that these are not even completely healed on initial inspection. Overall I am very pleased with where things stand today. Good news is that they are healed he is actually get ready to go out of town and that will be helpful as well as he will be a full part of the time. Readmission: 02/04/2021 upon evaluation today patient appears to be doing well at this point in regard to his left heel that I  previously saw him for. Unfortunately he is having issues with his right lower extremity. He has significant wounds at this point he also has erythema noted there is definite signs of cellulitis which is unfortunate. With that being said I think we do need to address this sooner rather than later. The good news is he did have a nice trip to the beach. He tells me that he had no issues during that time. 4/27; patient on Bactrim. Culture showed methicillin sensitive staph aureus therefore the Bactrim should be effective. 2 small areas on the right leg are healed the area on the mid aspect of the tibia almost 100% covered in a very adherent necrotic debris. We have been using silver alginate 02/20/2021 upon evaluation today patient appears to be doing well with regard to his wound. He is showing signs of improvement which is great news overall very pleased with where things stand today. No fevers, chills, nausea, vomiting, or diarrhea. 02/27/2021 upon evaluation today patient appears to be doing well with regard to his leg ulcer. He is tolerating the dressing changes and overall appears to be doing quite excellent. I am extremely pleased with where things stand and overall I think patient is making great progress. There is no sign of active infection at this time which is also great news. 03/06/2021 upon evaluation  today patient appears to be doing excellent in regard to his wounds. In fact he appears to be completely healed today based on what I am seeing. This is excellent news and overall I am extremely pleased with where he stands. Overall the patient is happy to hear this as well this has been quite sometime coming. Readmission: 04/01/2021 patient unfortunately returns for readmission today. He tells me that he has been wearing his compression socks on the right leg daily and he does not really know what is going on and why his legs are doing what they are doing. We did therefore go ahead and probe  deeper into exactly what has been going on with him today. Subsequently the patient tells me that when he gets up and what we would call "first thing in the morning" are really 2 different things". He does not tend to sleep well so he tells me that he will often wake up at 3:00 in the morning. He will then potentially going into the living room to get in his chair where he may read a book for a little while and then potentially fall asleep back in his chair. He then subsequently wake up around 5:00 or so and then get up and in his words "putter around". This often will end with him proceeding at some point around 8 AM or 9 AM to putting on his compression socks. With that being said this means that anywhere from the 3:00 in the morning till roughly around 9:00 in the morning he has no compression on yet he is sitting in his recliner, walking around and up and about, and this is at least 5 to 6 hours of noncompressed time. That may be our issue here but I did not realize until we discussed this further today. Fortunately there does not appear to be any signs of active infection at this time which is great news. With that being said he has multiple wounds of the bilateral lower extremities. 04/10/2021 upon evaluation today patient appears to be doing well with regard to his legs for the most part. He does look like he had some injury where his wrap slid down nonetheless he did some "doctoring on them". I do feel like most of the areas honestly have cleared back up which is great news. There does not appear to be any signs of active infection at this time which is also great news. No fevers, chills, nausea, vomiting, or diarrhea. 04/18/2021 upon evaluation today patient appears to be doing well with regard to his wounds in fact he appears to be completely healed which is great news. Fortunately there does not appear to be any signs of active infection which is great news. No fevers, chills, nausea, vomiting,  or diarrhea. READMISSION 07/28/2021 This is a now 76 year old man we have had in this clinic for quite a bit of this year. 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 He wears compression stockings. 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 He comes in an old running shoes although he says he wears better shoes at home. His history is that he noticed a blister in the right posterior heel. This open. He has been applying Neosporin to it currently the wound measures 2 x 1.4 cm. 100% slough covered. Not really offloading this in any rigorous way. Past medical history is essentially unchanged he has paroxysmal atrial fibrillation on Coumadin type  2 diabetes with a recent hemoglobin A1c of 7 on metformin. ABI in our clinic was 0.98 on the right 08/05/2021 upon evaluation today patient appears to be doing well with regard to his heel ulcer. Fortunately there does not appear to be any signs of active infection at this time. Overall I been very pleased with where things seem to be currently as far as the wound healing is concerned. Again this is first a lot of seeing him Dr. Dellia Nims readmitted him last week. Nonetheless I think we are definitely making some progress here. 08/11/2021 upon evaluation today patient's wound on the heel actually showing signs of excellent improvement. I am actually very pleased with where things stand currently. No fevers, chills, nausea, vomiting, or diarrhea. There does not appear to be any need for sharp debridement today either which is also great news. 08/25/2021 upon evaluation today patient appears to be doing well with regard to his heel ulcer. This is actually looking significantly improved compared to last time I saw him. Fortunately there does not appear to be any evidence of active infection at this time. No fevers, chills, nausea, vomiting, or  diarrhea. 09/01/2021 upon evaluation today patient appears to be doing decently well in regard to his wounds. Fortunately there does not appear to be any signs of active infection at this time which is great news and overall very pleased in that regard. I do not see any evidence of active infection systemically which is great news as well. No fevers, chills, nausea, vomiting, or diarrhea. I think that the heel is making excellent progress. NICKI, GRACY ([id number]) [id number] Page 3 of 10 09/15/2021 upon evaluation today patient's heel unfortunately is significantly worse compared to what it was previous. I do believe that at this time he would benefit from switching back to something a little bit more offloading from the cushion shoe he has right now this is more of a heel protector what I really need is a Prevalon offloading boot which I think is good to do a lot better for him than just the small heel protector. 09/23/2021 upon evaluation today patient appears to be doing a little better in regards to last week's visit. Overall I think that he is making good progress which is great news and there does not appear to be any evidence of active infection at this time. No fevers, chills, nausea, vomiting, or diarrhea. 09/30/2021 upon evaluation patient's heel is actually showing signs of good improvement which is great news. Fortunately there does not appear to be any evidence of active infection locally nor systemically at this point. No fevers, chills, nausea, vomiting, or diarrhea. 10/07/2021 upon evaluation today patient appears to be doing well currently in regard to his heel ulcer. He has been tolerating the dressing changes without complication. Fortunately I do not see any signs of active infection at this time which is great news. No fevers, chills, nausea, vomiting, or diarrhea. 12/27; wound is measuring smaller. We have been using silver alginate 10/28/2021 upon  evaluation today patient appears to be doing well currently in regard to his heel ulcer. I am actually very pleased with where things stand and I think he is making excellent progress. Fortunately I do not see any signs of active infection locally nor systemically at this point which is great news. Nonetheless I do believe that the patient would benefit from a new offloading shoe and has been using it Velcro is no longer functioning properly and he tells me that he  almost tripped and fell because of that today. Obviously I do not want him following that would be very bad. 11/11/2021 upon evaluation today patient appears to be doing excellent in regard to his wound. In fact this appears to be completely healed based on what I see currently. I do not see any signs of anything open or draining and I did double check just by clearing away some of the callus around the edges of the wound to ensure that there was nothing still open or hiding underneath. Once this was cleared away it appeared that the patient was doing significantly better at this time which is great news and there was nothing actually open. Readmission: 06-19-2022 upon evaluation today patient appears to be doing well currently in regard to his heel ulcer all things considered. He unfortunately is having a bit of breakdown in general as far as the heel is concerned not nearly as bad as what we noted last time he was here in the clinic. The good news is I do think that this should hopefully heal much more rapidly than what we noted last time. His past medical history has not changed he is on Coumadin. 07-03-2022 upon evaluation today patient appears to be doing better in regard to his heel. We will start to see some improvement here which is good news. Fortunately I do not see any evidence of infection locally or systemically at this time which is great news. No fevers, chills, nausea, vomiting, or diarrhea. 07-10-2022 upon evaluation today  patient appears to be doing well currently in regard to his wound from the callus standpoint around the edges. Unfortunately from an actual wound bed standpoint he has some deep tissue injury noted at this point. Again I am not sure exactly what is going on that is causing this pressure to the region but he is definitely gotten pressure that is occurring and causing this to not heal as effectively as what we would like to see. I discussed with him today that he may need to at night when he sleeping use a pillow up underneath his calf in order to prevent the heel from touching the bed at all. I also think that it may be beneficial for him to monitor throughout his day and make sure there is no other time when he is getting the pressure to the heel. 07-23-2022 upon evaluation today patient appears to be doing poorly currently in regard to his heel ulcer. He has been tolerating the dressing changes without complication. Unfortunately I feel like he may be infected based on what I am seeing. 07-28-2022 I did review patient's culture results which showed positive for Proteus as well as Staphylococcus both of which would be treated with the Bactrim DS that I gave him at the last visit. This is good news and hopefully means that we should be able to apply the cast today as long as the wound itself does not appear to be doing poorly. Patient's wound bed actually showed signs of doing quite well and I am very pleased with where things stand and I do not see any signs of worsening overall which is great news. 08-04-2022 upon evaluation today patient appears to be doing well currently in regard to his heel ulcer. I am actually very pleased with where things stand and I do think this is looking significantly better. With regard to his right shin that is also showing signs of improvement which is great news. 08-10-2022 upon evaluation today patient's wound  on the heel actually appears to be doing significantly  better. In regard to the right anterior shin this is showing signs of healing it might even be completely healed but I still get a monitor I cannot get anything to fill away but also could not see where there was anything actually draining at this point. I am tending to think healed but I want a monitor 1 more week before I call it for sure. 08-17-2022 upon evaluation today patient appears to be doing well currently in regard to his heel. Fortunately he is tolerating the dressing changes without complication. I do not see any signs of infection and overall I think he is making good progress here. We did get approval for the Apligraf but I think he had a $295 co-pay that would have to be paid per application. With that being said as good as he is doing right now I do not think that is necessary but is still an option depending on how things progress if we need to speed this up quite a bit. 08/24/2022; this patient has a wound on his left heel in the setting of type 2 diabetes. We have been using Prisma. He has a heel offloading boot 08-31-2022 upon evaluation today patient appears to be doing poorly in regard to his heel ulcer which is still showing signs of bruising I am just not as happy as I was when I saw him 2 weeks ago with this. He saw Dr. Dellia Nims last week. Also did not look good at that point apparently. Nonetheless I think that the patient is still continuing to have some counterpressure getting to the wound bed unfortunately. 09-07-2022 upon evaluation today patient appears to be doing well currently in regard to his heel ulcer. He has been tolerating the dressing changes without complication. Fortunately there does not appear to be any signs of active infection locally or systemically at this time. Fortunately I do not see any evidence of active infection at this time. 09-15-2022 upon evaluation today patient appears to be doing well currently in regard to his legs which in fact appear to be  completely healed this is great news. With that being said his heel does have some erythema and warmth as well as drainage I am concerned about cellulitis at this location. 09-21-2022 upon evaluation today patient's wound is actually showing signs of being a little bit smaller but still we are not doing nearly as well as what I would like to see. Fortunately I do not see any signs of infection at this time which is great news and overall I am extremely pleased in that regard. With that being said I do think that he needs to continue to elevate his leg although the edema is under great control with a compression wrap I think switching to a different dressing topically may be beneficial. My suggestion is to switch him over to a Iodoflex dressing. 09-28-2022 upon evaluation today patient appears to be doing well with regard to his heel ulcer. This is actually showing signs of improvement which is great news and overall I am extremely pleased with where we stand today. 10-06-2022 upon evaluation today patient actually appears to be making excellent progress at this point. Fortunately there does not appear to be any signs of active infection locally nor systemically which is great news and overall I am extremely pleased with where we stand. I do feel like he is actually making some pretty good progress here as we switch to  the wrap along with the Iodoflex. Electronic Signature(s) Signed: 10/06/2022 3:24:29 PM By: [name] Keeler PA-C Entered By: [name] on 10/06/2022 15:24:29 [name] ([id number]) [id number] Page 4 of 10 -------------------------------------------------------------------------------- Physical Exam Details Patient Name: Date of Service: [name], [name] [name] 10/06/2022 2:15 PM Medical Record Number: [id number] Patient Account Number: [id number] Date of Birth/Sex: Treating RN: 04-Aug-1946 (76 y.o. [name] Primary Care Provider: Fulton [name] Other Clinician: Massie [name] Referring Provider: Treating Provider/Extender: [name] Weeks in Treatment: 100 Constitutional Well-nourished and well-hydrated in no acute distress. Respiratory normal breathing without difficulty. Psychiatric this patient is able to make decisions and demonstrates good insight into disease process. Alert and Oriented x 3. pleasant and cooperative. Notes Upon inspection patient's wound bed actually showed signs of good granulation epithelization at this point. Fortunately I see no signs of infection and I feel like they were really doing quite well here. I am extremely pleased with where things stand. Electronic Signature(s) Signed: 10/06/2022 3:24:47 PM By: [name] Keeler PA-C Entered By: [name] on 10/06/2022 15:24:47 -------------------------------------------------------------------------------- Physician Orders Details Patient Name: Date of Service: [name], [name] [name] 10/06/2022 2:15 PM Medical Record Number: [id number] Patient Account Number: [id number] Date of Birth/Sex: Treating RN: [birth year] (76 y.o. 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 DAVIDLEE, JEANBAPTISTE ([id number]) [id number] Page 5 of 10 Bathing/ Shower/ Hygiene May shower; gently cleanse wound with antibacterial soap, rinse and pat dry prior to dressing wounds Edema Control - Lymphedema / Segmental Compressive Device / Other 3  Layer Compression System for Lymphedema. - left lower leg Non-Wound Condition dditional non-wound orders/instructions: - Apply 40% Urea cream to dry cracked skin on feet. A Off-Loading Open toe surgical shoe with peg assist. Wound Treatment Wound #10 - Calcaneus Wound Laterality: Left Prim Dressing: IODOFLEX 0.9% Cadexomer Iodine Pad 3 x Per Week/30 Days ary Discharge Instructions: Apply Iodoflex to wound bed only as directed. Secondary Dressing: Zetuvit Plus 4x8 (in/in) 3 x Per Week/30 Days Compression Wrap: 3-LAYER WRAP - Profore Lite LF 3 Multilayer Compression Bandaging System 3 x Per Week/30 Days Discharge Instructions: Apply 3 multi-layer wrap as prescribed. Electronic Signature(s) Signed: 10/06/2022 4:11:06 PM By: [name] Keeler PA-C Signed: 10/06/2022 4:44:06 PM By: [name] Entered By: [name] on 10/06/2022 15:06:56 -------------------------------------------------------------------------------- Problem List Details Patient Name: Date of Service: [name], [name] [name] 10/06/2022 2:15 PM Medical Record Number: [id number] Patient Account Number: [id number] Date of Birth/Sex: Treating RN: [name] 12, 1946 (76 y.o. [name] Primary Care Provider: Fulton [name] Other Clinician: Massie [name] Referring Provider: Treating Provider/Extender: [name] Weeks in Treatment: 15 Active Problems ICD-10 Encounter Code Description Active Date MDM Diagnosis E11.621 Type 2 diabetes mellitus with foot ulcer 06/19/2022 No Yes 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sed 06/19/2022 No Yes E11.42 Type 2 diabetes mellitus with diabetic polyneuropathy 06/19/2022 No Yes Inactive Problems Resolved Problems ADALBERT, ALBERTO ([id number]) [phone] Page 6 of 10 Electronic Signature(s) Signed: 10/06/2022 2:35:01 PM By: [name] Keeler PA-C Entered By: Melburn Hake,  Ho Parisi on 10/06/2022  14:35:00 -------------------------------------------------------------------------------- Progress Note Details Patient Name: Date of Service: [name], [name] [name] 10/06/2022 2:15 PM Medical Record Number: [id number] Patient Account Number: [id number] Date of Birth/Sex: Treating RN: [birth year] (76 y.o. [name] Primary Care Provider: Fulton [name] Other Clinician: Massie [name] Referring Provider: Treating Provider/Extender: [name] Weeks in Treatment: 15 Subjective Chief Complaint Information obtained from Patient Left heel ulcer History of Present Illness (HPI) 09/24/2020 on evaluation today patient presents today for a heel ulcer that he tells me has been present for about 2 years. He has been seeing podiatry and they have been attempting to manage this including what sounds to be a total contact cast, Unna boot, and just standard dressings otherwise as well. Most recently has been using triple antibiotic ointment. With that being said unfortunately despite everything he really has not had any significant improvement. He tells me that he cannot even really remember exactly how this began but he presumed it may have rubbed on his shoes or something of that nature. 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 He tells me that this does seem to help. Fortunately there is no signs of active infection at this time. His most recent hemoglobin A1c was 8.1 though he knows this was this year he cannot tell me the exact time. His fluid pills currently to help with some of the lower extremity edema although he does obviously have signs of venous stasis/lymphedema. Currently there is no evidence of  active infection. No fevers, chills, nausea, vomiting, or diarrhea. Patient has had fairly recent ABIs which were performed on 07/19/2020 and revealed that he has normal findings in both the ankle and toe locations bilaterally. His ABI on the right was 1.09 on the left was 1.08 with a TBI on the right of 0.88 and on the left of 0.94. Triphasic flow was noted throughout. 10/08/2020 on evaluation today patient appears to be doing pretty well in regard to his left heel currently in fact this is doing a great job and seems to be healing quite nicely. Unfortunately on his right leg he had a pile of wood that actually fell on him injuring his right leg this is somewhat erythematous has me concerned little bit about cellulitis though there is not really a good area to culture at this point. 10/24/2020 upon evaluation today patient appears to be doing well with regard to his heel ulcer. He is showing signs of improvement which is great news. His right leg is completely healed. Overall I feel like he is doing excellent and there is no signs of infection. 11/07/2020 upon evaluation today patient appears to be doing well with regard to his heel ulcer. He tells me that last week when he was unable to come in his wife actually thought that the wound was very close to closing if not closed. Then it began to "reopen again". I really feel like what may have happened as the collagen may have dried over the wound bed and that because that misunderstanding with thinking that the wound was healing. With that being said I did not see it last week I do not know that for certain. Either way I feel like  he is doing great today I see no signs of infection at this point. 11/26/2020 upon inspection today patient appears to be doing decently well in regard to his heel ulcer. He has been tolerating the dressing changes without complication. Fortunately there is no sign of active infection at this time. No fevers, chills, nausea,  vomiting, or diarrhea. 12/10/2020 upon evaluation today patient appears to be doing fairly well in regard to the wound on his heel as well as what appears to be a new wound of the left first metatarsal head plantar aspect. This seems to be an area that was callus that has split as the patient tells me has been trying to walk on his toes more has probably where this came from. With that being said there does not appear to be signs of active infection which is great news. 12/17/2020 upon evaluation today patient actually appears to be making good progress currently. Fortunately there is no evidence of active infection at this time. Overall I feel like he is very close to complete closure. 12/26/2020 upon evaluation today patient appears to be doing excellent in regard to his wounds. In fact I am not certain that these are not even completely healed on initial inspection. Overall I am very pleased with where things stand today. Good news is that they are healed he is actually get ready to go out of town and that will be helpful as well as he will be a full part of the time. Readmission: 02/04/2021 upon evaluation today patient appears to be doing well at this point in regard to his left heel that I previously saw him for. Unfortunately he is having issues with his right lower extremity. He has significant wounds at this point he also has erythema noted there is definite signs of cellulitis which is unfortunate. With that being said I think we do need to address this sooner rather than later. The good news is he did have a nice trip to the beach. He tells me that he had no issues during that time. 4/27; patient on Bactrim. Culture showed methicillin sensitive staph aureus therefore the Bactrim should be effective. 2 small areas on the right leg are healed the area on the mid aspect of the tibia almost 100% covered in a very adherent necrotic debris. We have been using silver alginate 02/20/2021 upon evaluation  today patient appears to be doing well with regard to his wound. He is showing signs of improvement which is great news overall very ZAILYN, ROWSER ([id number]) [phone].pdf Page 7 of 10 pleased with where things stand today. No fevers, chills, nausea, vomiting, or diarrhea. 02/27/2021 upon evaluation today patient appears to be doing well with regard to his leg ulcer. He is tolerating the dressing changes and overall appears to be doing quite excellent. I am extremely pleased with where things stand and overall I think patient is making great progress. There is no sign of active infection at this time which is also great news. 03/06/2021 upon evaluation today patient appears to be doing excellent in regard to his wounds. In fact he appears to be completely healed today based on what I am seeing. This is excellent news and overall I am extremely pleased with where he stands. Overall the patient is happy to hear this as well this has been quite sometime coming. Readmission: 04/01/2021 patient unfortunately returns for readmission today. He tells me that he has been wearing his compression socks on the right leg daily and he  does not really know what is going on and why his legs are doing what they are doing. We did therefore go ahead and probe deeper into exactly what has been going on with him today. Subsequently the patient tells me that when he gets up and what we would call "first thing in the morning" are really 2 different things". He does not tend to sleep well so he tells me that he will often wake up at 3:00 in the morning. He will then potentially going into the living room to get in his chair where he may read a book for a little while and then potentially fall asleep back in his chair. He then subsequently wake up around 5:00 or so and then get up and in his words "putter around". This often will end with him proceeding at some point around 8 AM or 9 AM to putting  on his compression socks. With that being said this means that anywhere from the 3:00 in the morning till roughly around 9:00 in the morning he has no compression on yet he is sitting in his recliner, walking around and up and about, and this is at least 5 to 6 hours of noncompressed time. That may be our issue here but I did not realize until we discussed this further today. Fortunately there does not appear to be any signs of active infection at this time which is great news. With that being said he has multiple wounds of the bilateral lower extremities. 04/10/2021 upon evaluation today patient appears to be doing well with regard to his legs for the most part. He does look like he had some injury where his wrap slid down nonetheless he did some "doctoring on them". I do feel like most of the areas honestly have cleared back up which is great news. There does not appear to be any signs of active infection at this time which is also great news. No fevers, chills, nausea, vomiting, or diarrhea. 04/18/2021 upon evaluation today patient appears to be doing well with regard to his wounds in fact he appears to be completely healed which is great news. Fortunately there does not appear to be any signs of active infection which is great news. No fevers, chills, nausea, vomiting, or diarrhea. READMISSION 07/28/2021 This is a now 76 year old man we have had in this clinic for quite a bit of this year. 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 He wears compression stockings. 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 He comes in an old running shoes although he says he wears better shoes at home. His history is that he noticed a blister in the right posterior heel. This open. He has been applying Neosporin to it currently the wound measures 2 x 1.4 cm. 100% slough covered. Not really offloading this in  any rigorous way. Past medical history is essentially unchanged he has paroxysmal atrial fibrillation on Coumadin type 2 diabetes with a recent hemoglobin A1c of 7 on metformin. ABI in our clinic was 0.98 on the right 08/05/2021 upon evaluation today patient appears to be doing well with regard to his heel ulcer. Fortunately there does not appear to be any signs of active infection at this time. Overall I been very pleased with where things seem to be currently as far as the wound healing is concerned. Again this is first a lot of seeing him Dr. Dellia Nims readmitted  him last week. Nonetheless I think we are definitely making some progress here. 08/11/2021 upon evaluation today patient's wound on the heel actually showing signs of excellent improvement. I am actually very pleased with where things stand currently. No fevers, chills, nausea, vomiting, or diarrhea. There does not appear to be any need for sharp debridement today either which is also great news. 08/25/2021 upon evaluation today patient appears to be doing well with regard to his heel ulcer. This is actually looking significantly improved compared to last time I saw him. Fortunately there does not appear to be any evidence of active infection at this time. No fevers, chills, nausea, vomiting, or diarrhea. 09/01/2021 upon evaluation today patient appears to be doing decently well in regard to his wounds. Fortunately there does not appear to be any signs of active infection at this time which is great news and overall very pleased in that regard. I do not see any evidence of active infection systemically which is great news as well. No fevers, chills, nausea, vomiting, or diarrhea. I think that the heel is making excellent progress. 09/15/2021 upon evaluation today patient's heel unfortunately is significantly worse compared to what it was previous. I do believe that at this time he would benefit from switching back to something a little bit  more offloading from the cushion shoe he has right now this is more of a heel protector what I really need is a Prevalon offloading boot which I think is good to do a lot better for him than just the small heel protector. 09/23/2021 upon evaluation today patient appears to be doing a little better in regards to last week's visit. Overall I think that he is making good progress which is great news and there does not appear to be any evidence of active infection at this time. No fevers, chills, nausea, vomiting, or diarrhea. 09/30/2021 upon evaluation patient's heel is actually showing signs of good improvement which is great news. Fortunately there does not appear to be any evidence of active infection locally nor systemically at this point. No fevers, chills, nausea, vomiting, or diarrhea. 10/07/2021 upon evaluation today patient appears to be doing well currently in regard to his heel ulcer. He has been tolerating the dressing changes without complication. Fortunately I do not see any signs of active infection at this time which is great news. No fevers, chills, nausea, vomiting, or diarrhea. 12/27; wound is measuring smaller. We have been using silver alginate 10/28/2021 upon evaluation today patient appears to be doing well currently in regard to his heel ulcer. I am actually very pleased with where things stand and I think he is making excellent progress. Fortunately I do not see any signs of active infection locally nor systemically at this point which is great news. Nonetheless I do believe that the patient would benefit from a new offloading shoe and has been using it Velcro is no longer functioning properly and he tells me that he almost tripped and fell because of that today. Obviously I do not want him following that would be very bad. 11/11/2021 upon evaluation today patient appears to be doing excellent in regard to his wound. In fact this appears to be completely healed based on what I  see currently. I do not see any signs of anything open or draining and I did double check just by clearing away some of the callus around the edges of the wound to ensure that there was nothing still open or hiding underneath. Once this  was cleared away it appeared that the patient was doing significantly better at this time which is great news and there was nothing actually open. Readmission: 06-19-2022 upon evaluation today patient appears to be doing well currently in regard to his heel ulcer all things considered. He unfortunately is having a bit of breakdown in general as far as the heel is concerned not nearly as bad as what we noted last time he was here in the clinic. The good news is I do think that this should hopefully heal much more rapidly than what we noted last time. His past medical history has not changed he is on Coumadin. 07-03-2022 upon evaluation today patient appears to be doing better in regard to his heel. We will start to see some improvement here which is good news. Fortunately I do not see any evidence of infection locally or systemically at this time which is great news. No fevers, chills, nausea, vomiting, or diarrhea. BOSS, DANIELSEN ([id number]) [id number] Page 8 of 10 07-10-2022 upon evaluation today patient appears to be doing well currently in regard to his wound from the callus standpoint around the edges. Unfortunately from an actual wound bed standpoint he has some deep tissue injury noted at this point. Again I am not sure exactly what is going on that is causing this pressure to the region but he is definitely gotten pressure that is occurring and causing this to not heal as effectively as what we would like to see. I discussed with him today that he may need to at night when he sleeping use a pillow up underneath his calf in order to prevent the heel from touching the bed at all. I also think that it may be beneficial for him to monitor  throughout his day and make sure there is no other time when he is getting the pressure to the heel. 07-23-2022 upon evaluation today patient appears to be doing poorly currently in regard to his heel ulcer. He has been tolerating the dressing changes without complication. Unfortunately I feel like he may be infected based on what I am seeing. 07-28-2022 I did review patient's culture results which showed positive for Proteus as well as Staphylococcus both of which would be treated with the Bactrim DS that I gave him at the last visit. This is good news and hopefully means that we should be able to apply the cast today as long as the wound itself does not appear to be doing poorly. Patient's wound bed actually showed signs of doing quite well and I am very pleased with where things stand and I do not see any signs of worsening overall which is great news. 08-04-2022 upon evaluation today patient appears to be doing well currently in regard to his heel ulcer. I am actually very pleased with where things stand and I do think this is looking significantly better. With regard to his right shin that is also showing signs of improvement which is great news. 08-10-2022 upon evaluation today patient's wound on the heel actually appears to be doing significantly better. In regard to the right anterior shin this is showing signs of healing it might even be completely healed but I still get a monitor I cannot get anything to fill away but also could not see where there was anything actually draining at this point. I am tending to think healed but I want a monitor 1 more week before I call it for sure. 08-17-2022 upon evaluation today patient appears to  be doing well currently in regard to his heel. Fortunately he is tolerating the dressing changes without complication. I do not see any signs of infection and overall I think he is making good progress here. We did get approval for the Apligraf but I think he had  a $295 co-pay that would have to be paid per application. With that being said as good as he is doing right now I do not think that is necessary but is still an option depending on how things progress if we need to speed this up quite a bit. 08/24/2022; this patient has a wound on his left heel in the setting of type 2 diabetes. We have been using Prisma. He has a heel offloading boot 08-31-2022 upon evaluation today patient appears to be doing poorly in regard to his heel ulcer which is still showing signs of bruising I am just not as happy as I was when I saw him 2 weeks ago with this. He saw Dr. Dellia Nims last week. Also did not look good at that point apparently. Nonetheless I think that the patient is still continuing to have some counterpressure getting to the wound bed unfortunately. 09-07-2022 upon evaluation today patient appears to be doing well currently in regard to his heel ulcer. He has been tolerating the dressing changes without complication. Fortunately there does not appear to be any signs of active infection locally or systemically at this time. Fortunately I do not see any evidence of active infection at this time. 09-15-2022 upon evaluation today patient appears to be doing well currently in regard to his legs which in fact appear to be completely healed this is great news. With that being said his heel does have some erythema and warmth as well as drainage I am concerned about cellulitis at this location. 09-21-2022 upon evaluation today patient's wound is actually showing signs of being a little bit smaller but still we are not doing nearly as well as what I would like to see. Fortunately I do not see any signs of infection at this time which is great news and overall I am extremely pleased in that regard. With that being said I do think that he needs to continue to elevate his leg although the edema is under great control with a compression wrap I think switching to a  different dressing topically may be beneficial. My suggestion is to switch him over to a Iodoflex dressing. 09-28-2022 upon evaluation today patient appears to be doing well with regard to his heel ulcer. This is actually showing signs of improvement which is great news and overall I am extremely pleased with where we stand today. 10-06-2022 upon evaluation today patient actually appears to be making excellent progress at this point. Fortunately there does not appear to be any signs of active infection locally nor systemically which is great news and overall I am extremely pleased with where we stand. I do feel like he is actually making some pretty good progress here as we switch to the wrap along with the Iodoflex. Objective Constitutional Well-nourished and well-hydrated in no acute distress. Vitals Time Taken: 2:45 PM, Height: 70 in, Weight: 265 lbs, BMI: 38, Temperature: 98.2 F, Pulse: 67 bpm, Respiratory Rate: 18 breaths/min, Blood Pressure: 134/76 mmHg. Respiratory normal breathing without difficulty. Psychiatric this patient is able to make decisions and demonstrates good insight into disease process. Alert and Oriented x 3. pleasant and cooperative. General Notes: Upon inspection patient's wound bed actually showed signs of good granulation  epithelization at this point. Fortunately I see no signs of infection and I feel like they were really doing quite well here. I am extremely pleased with where things stand. Integumentary (Hair, Skin) Wound #10 status is Open. Original cause of wound was Gradually Appeared. The date acquired was: 06/05/2022. The wound has been in treatment 15 weeks. The wound is located on the Left Calcaneus. The wound measures 1cm length x 1.9cm width x 0.2cm depth; 1.492cm^2 area and 0.298cm^3 volume. There is Fat Layer (Subcutaneous Tissue) exposed. There is no tunneling or undermining noted. There is a medium amount of serosanguineous drainage noted. The wound  margin is flat and intact. There is small (1-33%) pink granulation within the wound bed. There is a large (67-100%) amount of necrotic tissue within the wound bed including Adherent Slough. 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 There was a Three Layer Compression Therapy Procedure with a pre-treatment ABI of 1 by [name]. Post procedure Diagnosis Wound #10: Same as Pre-Procedure Plan Follow-up Appointments: Wound #10 Left Calcaneus: Return Appointment in 1 week. Bathing/ Shower/ Hygiene: May shower; gently cleanse wound with antibacterial soap, rinse and pat dry prior to dressing wounds Edema Control - Lymphedema / Segmental Compressive Device / Other: 3 Layer Compression System for Lymphedema. - left lower leg Non-Wound Condition: Additional non-wound orders/instructions: - Apply 40% Urea cream to dry cracked skin on feet. Off-Loading: Open toe surgical shoe with peg assist. WOUND #10: - Calcaneus Wound Laterality: Left Prim Dressing: IODOFLEX 0.9% Cadexomer Iodine Pad 3 x Per Week/30 Days ary Discharge Instructions: Apply Iodoflex to wound bed only as directed. Secondary Dressing: Zetuvit Plus 4x8 (in/in) 3 x Per Week/30 Days Com pression Wrap: 3-LAYER WRAP - Profore Lite LF 3 Multilayer Compression Bandaging System 3 x Per Week/30 Days Discharge Instructions: Apply 3 multi-layer wrap as prescribed. 1. I am going to recommend that we have the patient continue to monitor for any signs of infection or worsening. Fortunately I do not see any signs of active infection which is great news. 2. I  am good recommend as well that we patient have the patient continue with the Iodoflex which is really doing a great job here. 3. He will also continue with the 3 layer compression wrap which I think is doing also for him. We will see patient back for reevaluation in 1 week here in the clinic. If anything worsens or changes patient will contact our office for additional recommendations. Electronic Signature(s) Signed: 10/06/2022 3:25:18 PM By: [name] Keeler PA-C Entered By: [name] on 10/06/2022 15:25:18 -------------------------------------------------------------------------------- SuperBill Details Patient Name: Date of Service: [name], [name] [name] 10/06/2022 Medical Record Number: [id number] Patient Account Number: [id number] Date of Birth/Sex: Treating RN: 1946/06/03 (76 y.o. 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 Facility Procedures : CPT4 Code: [id number]  Description: Acupuncturist Use Only) [phone] - [facility]G ICD-10 Diagnosis Description 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sed Modifier: Quantity: 1 Physician Procedures : CPT4 Code Description Modifier [id number] - WC PHYS LEVEL 3 - EST PT ICD-10 Diagnosis Description E11.621 Type 2 diabetes mellitus with foot ulcer 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sed E11.42 Type 2  diabetes mellitus with diabetic polyneuropathy Quantity: 1 Electronic  Signature(s) Signed: 10/06/2022 3:25:38 PM By: [name] Keeler PA-C Entered By: [name] on 10/06/2022 15:25:38

## 2022-10-06 NOTE — Progress Notes (Addendum)
[name], [name] ([id number]) [id number] Page 1 of 9 Visit Report for 10/06/2022 Arrival Information Details Patient Name: Date of Service: [name], [name]. 10/06/2022 2:15 PM Medical Record Number: [id number] Patient Account Number: [id number] Date of Birth/Sex: Treating RN: 1946-01-30 (76 y.o. [name] Brinley [name]: Fulton Reek Other Clinician: Massie Kluver Referring Nicolaas Savo: Treating Shayon Trompeter/Extender: [name] in Treatment: 15 Visit Information History Since Last Visit All ordered tests and consults were completed: No Patient Arrived: [name] Added or deleted any medications: No Arrival Time: 14:38 Any new allergies or adverse reactions: No Transfer Assistance: None Had a fall or experienced change in No Patient Identification Verified: Yes activities of daily living that may affect Secondary Verification Process Completed: Yes risk of falls: Patient Requires Transmission-Based Precautions: No Signs or symptoms of abuse/neglect since last visito No Patient Has Alerts: Yes Hospitalized since last visit: No Patient Alerts: Patient on Blood Thinner Implantable device outside of the clinic excluding No Warfarin cellular tissue based products placed in the center Type II Diabetic since last visit: Has Dressing in Place as Prescribed: Yes Has Compression in Place as Prescribed: Yes Has Footwear/Offloading in Place as Prescribed: Yes Left: Wedge Shoe Pain Present Now: No Electronic Signature(s) Signed: 10/06/2022 4:44:06 PM By: Massie Kluver Entered By: Massie Kluver on 10/06/2022 14:45:18 -------------------------------------------------------------------------------- Clinic Level of [name] Assessment Details Patient Name: Date of Service: [name]. 10/06/2022 2:15 PM Medical Record Number: [id number] Patient Account Number: [id number] Date of Birth/Sex: Treating RN: 04/17/1946 (76 y.o.  [name]: Fulton Reek Other Clinician: Massie Kluver Referring [name]: Treating [name]/Extender: [name] in Treatment: 15 Clinic Level of [name] Assessment Items TOOL 1 Quantity Score [] - 0 Use when EandM and Procedure is performed on INITIAL visit ASSESSMENTS - Nursing Assessment / Reassessment [name], [name] ([id number]) [phone].pdf Page 2 of 9 [] - 0 General Physical Exam (combine w/ comprehensive assessment (listed just below) when performed on new pt. evals) [] - 0 Comprehensive Assessment (HX, ROS, Risk Assessments, Wounds Hx, etc.) ASSESSMENTS - Wound and Skin Assessment / Reassessment [] - 0 Dermatologic / Skin Assessment (not related to wound area) ASSESSMENTS - Ostomy and/or Continence Assessment and [name] [] - 0 Incontinence Assessment and Management [] - 0 Ostomy [name] Assessment and Management (repouching, etc.) PROCESS - Coordination of [name] [] - 0 Simple Patient / Family Education for ongoing [name] [] - 0 Complex (extensive) Patient / Family Education for ongoing [name] [] - 0 Staff obtains Programmer, systems, Records, T Results / Process Orders est [] - 0 Staff telephones HHA, Nursing Homes / Clarify orders / etc [] - 0 Routine Transfer to another Facility (non-emergent condition) [] - 0 Routine Hospital Admission (non-emergent condition) [] - 0 New Admissions / Biomedical engineer / Ordering NPWT Apligraf, etc. , [] - 0 Emergency Hospital Admission (emergent condition) PROCESS - Special Needs [] - 0 Pediatric / Minor Patient Management [] - 0 Isolation Patient Management [] - 0 Hearing / Language / Visual special needs [] - 0 Assessment of Community assistance (transportation, D/C planning, etc.) [] - 0 Additional assistance / Altered mentation [] - 0 Support Surface(s) Assessment (bed, cushion, seat, etc.) INTERVENTIONS - Miscellaneous [] - 0 External ear  exam [] - 0 Patient Transfer (multiple staff / Civil Service fast streamer / Similar devices) [] - 0 Simple Staple / Suture removal (25 or less) [] - 0 Complex Staple / Suture removal (26 or more) [] -  0 Hypo/Hyperglycemic Management (do not check if billed separately) [] - 0 Ankle / Brachial Index (ABI) - do not check if billed separately Has the patient been seen at the hospital within the last three years: Yes Total Score: 0 Level Of [name]: ____ Electronic Signature(s) Signed: 10/06/2022 4:44:06 PM By: Massie Kluver Entered By: Massie Kluver on 10/06/2022 15:07:04 -------------------------------------------------------------------------------- Compression Therapy Details Patient Name: Date of Service: [name], [name]. 10/06/2022 2:15 PM Medical Record Number: [id number] Patient Account Number: [id number] [name], [name] ([id number]) [phone].pdf Page 3 of 9 Date of Birth/Sex: Treating RN: Oct 28, 1945 (76 y.o. [name]: Fulton Reek Other Clinician: Massie Kluver Referring [name]: Treating Brayant Dorr/Extender: [name] in Treatment: 15 Compression Therapy Performed for Wound Assessment: Wound #10 Left Calcaneus Performed By: Lenice Pressman, [name], Compression Type: Three Layer Pre Treatment ABI: 1 Post Procedure Diagnosis Same as Pre-procedure Electronic Signature(s) Signed: 10/06/2022 4:44:06 PM By: Massie Kluver Entered By: Massie Kluver on 10/06/2022 15:06:26 -------------------------------------------------------------------------------- Encounter Discharge Information Details Patient Name: Date of Service: [name], [name]. 10/06/2022 2:15 PM Medical Record Number: [id number] Patient Account Number: [id number] Date of Birth/Sex: Treating RN: 20-Oct-1945 (76 y.o. [name]: Fulton Reek Other Clinician: Massie Kluver Referring [name]: Treating  [name]/Extender: [name] in Treatment: 15 Encounter Discharge Information Items Discharge Condition: Stable Ambulatory Status: Walker Discharge Destination: Home Transportation: Private Auto Accompanied By: self Schedule Follow-up Appointment: Yes Clinical Summary of [name]: Electronic Signature(s) Signed: 10/06/2022 4:44:06 PM By: Massie Kluver Entered By: Massie Kluver on 10/06/2022 15:26:05 -------------------------------------------------------------------------------- Lower Extremity Assessment Details Patient Name: Date of Service: [name], [name]. 10/06/2022 2:15 PM Medical Record Number: [id number] Patient Account Number: [id number] Date of Birth/Sex: Treating RN: 1946-03-14 (76 y.o. [name] Shandell Jallow: Fulton Reek Other Clinician: Massie Kluver Referring Jordie Schreur: Treating Abdurrahman Petersheim/Extender: [name] in Treatment: [address] ([id number]) [id number] Page 4 of 9 Edema Assessment Assessed: [Left: Yes] [Right: No] Edema: [Left: Ye] [Right: s] Calf Left: Right: Point of Measurement: 33 cm From Medial Instep 39 cm Ankle Left: Right: Point of Measurement: 11 cm From Medial Instep 23.2 cm Vascular Assessment Pulses: Dorsalis Pedis Palpable: [Left:Yes] Electronic Signature(s) Signed: 10/06/2022 4:44:06 PM By: Massie Kluver Signed: 10/06/2022 5:11:45 PM By: [name], BSN, RN, CWS, [name] RN, BSN Entered By: Massie Kluver on 10/06/2022 14:59:12 -------------------------------------------------------------------------------- Multi Wound Chart Details Patient Name: Date of Service: [name], [name]. 10/06/2022 2:15 PM Medical Record Number: [id number] Patient Account Number: [id number] Date of Birth/Sex: Treating RN: 1946/09/21 (76 y.o. [name] Anali Cabanilla: Fulton Reek Other Clinician: Massie Kluver Referring Ziare Cryder: Treating  Anselma Herbel/Extender: [name] in Treatment: 15 Vital Signs Height(in): 70 Pulse(bpm): 96 Weight(lbs): 265 Blood Pressure(mmHg): 134/76 Body Mass Index(BMI): 38 Temperature(F): 98.2 Respiratory Rate(breaths/min): 18 [10:Photos:] [N/A:N/A] Left Calcaneus N/A N/A Wound Location: Gradually Appeared N/A N/A Wounding 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 N/A N/A Secondary Etiology: Cataracts, Arrhythmia, Coronary N/A N/A Comorbid History: Artery Disease, Hypertension, Type II [name], [name] ([id number]) [id number] Page 5 of 9 Diabetes, Osteoarthritis, Neuropathy 06/05/2022 N/A N/A Date Acquired: 15 N/A N/A [name] of Treatment: Open N/A N/A Wound Status: No N/A N/A Wound Recurrence: 1x1.9x0.2 N/A N/A Measurements L x W x D (cm) 1.492 N/A N/A A (cm) : rea 0.298 N/A N/A Volume (cm) : -5.50% N/A N/A % Reduction in A rea: -5.30%  N/A N/A % Reduction in Volume: Grade 1 N/A N/A Classification: Medium N/A N/A Exudate A mount: Serosanguineous N/A N/A Exudate Type: red, brown N/A N/A Exudate Color: Flat and Intact N/A N/A Wound Margin: Small (1-33%) N/A N/A Granulation A mount: Pink N/A N/A Granulation Quality: Large (67-100%) N/A N/A Necrotic A mount: Fat Layer (Subcutaneous Tissue): Yes N/A N/A Exposed Structures: Fascia: No Tendon: No Muscle: No Joint: No Bone: No Small (1-33%) N/A N/A Epithelialization: Treatment Notes Electronic Signature(s) Signed: 10/06/2022 4:44:06 PM By: Venable, [name] Entered By: Venable, [name] on 10/06/2022 14:59:18 -------------------------------------------------------------------------------- Multi-Disciplinary [name] Plan Details Patient Name: Date of Service: [name], [name]. 10/06/2022 2:15 PM Medical Record Number: [id number] Patient Account Number: [id number] Date of Birth/Sex: Treating RN: 06/09/1946 (76 y.o. M)  Woody, [name] Primary [name] : Sparks, Jeffrey Other Clinician: Venable, [name] Referring : Treating /Extender: Stone, Hoyt Sparks, Jeffrey [name] in Treatment: 15 Active Inactive Pressure Nursing Diagnoses: Knowledge deficit related to causes and risk factors for pressure ulcer development Knowledge deficit related to management of pressures ulcers Potential for impaired tissue integrity related to pressure, friction, moisture, and shear Goals: Patient will remain free from development of additional pressure ulcers Date Initiated: 06/19/2022 Date Inactivated: 07/28/2022 Target Resolution Date: 06/19/2022 Goal Status: Met Patient/caregiver will verbalize risk factors for pressure ulcer development Date Initiated: 06/19/2022 Date Inactivated: 07/28/2022 Target Resolution Date: 06/19/2022 Goal Status: Met Patient/caregiver will verbalize understanding of pressure ulcer management Date Initiated: 06/19/2022 Target Resolution Date: 08/14/2022 Goal Status: Active [name], [name] ([id number]) [id number] Page 6 of 9 Interventions: Assess: immobility, friction, shearing, incontinence upon admission and as needed Assess offloading mechanisms upon admission and as needed Assess potential for pressure ulcer upon admission and as needed Provide education on pressure ulcers Notes: Wound/Skin Impairment Nursing Diagnoses: Impaired tissue integrity Goals: Patient/caregiver will verbalize understanding of skin [name] regimen Date Initiated: 06/19/2022 Date Inactivated: 07/10/2022 Target Resolution Date: 06/19/2022 Goal Status: Met Ulcer/skin breakdown will have a volume reduction of 30% by week 4 Date Initiated: 06/19/2022 Date Inactivated: 07/28/2022 Target Resolution Date: 07/17/2022 Goal Status: Unmet Unmet Reason: infection Ulcer/skin breakdown will have a volume reduction of 50% by week 8 Date Initiated: 07/28/2022 Target Resolution Date:  08/14/2022 Goal Status: Active Interventions: Assess patient/caregiver ability to obtain necessary supplies Assess patient/caregiver ability to perform ulcer/skin [name] regimen upon admission and as needed Assess ulceration(s) every visit Provide education on ulcer and skin [name] Treatment Activities: Skin [name] regimen initiated : 06/19/2022 Topical wound management initiated : 06/19/2022 Notes: Electronic Signature(s) Signed: 10/06/2022 4:44:06 PM By: Venable, [name] Signed: 10/06/2022 5:11:45 PM By: Woody, BSN, RN, CWS, [name] RN, BSN Entered By: Venable, [name] on 10/06/2022 15:25:30 -------------------------------------------------------------------------------- Pain Assessment Details Patient Name: Date of Service: [name], [name]. 10/06/2022 2:15 PM Medical Record Number: [id number] Patient Account Number: [id number] Date of Birth/Sex: Treating RN: 06/24/1946 (76 y.o. M) Woody, [name] Primary [name] : Sparks, Jeffrey Other Clinician: Venable, [name] Referring : Treating /Extender: Stone, Hoyt Sparks, Jeffrey [name] in Treatment: 15 Active Problems Location of Pain Severity and Description of Pain Patient Has Paino No Site Locations [name], [name] ([id number]) [id number] Page 7 of 9 Pain Management and Medication Current Pain Management: Electronic Signature(s) Signed: 10/06/2022 4:44:06 PM By: Venable, [name] Signed: 10/06/2022 5:11:45 PM By: Woody, BSN, RN, CWS, [name] RN, BSN Entered By: Venable, [name] on 10/06/2022 14:48:21 -------------------------------------------------------------------------------- Patient/Caregiver Education Details Patient Name: Date of Service: [name], [name]. 12/19/2023andnbsp2:15 PM Medical Record Number: [id number] Patient Account Number: [id number] Date of Birth/Gender: Treating   RN: 12-[name]-1946 (76 y.o. [name] Physician: Fulton Reek Other Clinician: Massie Kluver Referring  Physician: Treating Physician/Extender: [name] in Treatment: 15 Education Assessment Education Provided To: Patient Education Topics Provided Wound/Skin Impairment: Handouts: Other: continue wound [name] as directed Methods: Explain/Verbal Responses: State content correctly Electronic Signature(s) Signed: 10/06/2022 4:44:06 PM By: Massie Kluver Entered By: Massie Kluver on 10/06/2022 15:07:58 [name] ([id number]) [id number] Page 8 of 9 -------------------------------------------------------------------------------- Wound Assessment Details Patient Name: Date of Service: [name], [name]. 10/06/2022 2:15 PM Medical Record Number: [id number] Patient Account Number: [id number] Date of Birth/Sex: Treating RN: [birth year] (76 y.o. [name], [name] Primary [name] Ardeth Repetto: Fulton Reek Other Clinician: Massie Kluver Referring Ashunti Schofield: Treating Danasia Baker/Extender: [name] in Treatment: 15 Wound Status Wound Number: 10 Primary Diabetic Wound/Ulcer of the Lower Extremity Etiology: Wound Location: Left Calcaneus Secondary Pressure Ulcer Wounding Event: Gradually Appeared Etiology: Date Acquired: 06/05/2022 Wound Open [name] Of Treatment: 15 Status: Clustered Wound: No Comorbid Cataracts, Arrhythmia, Coronary Artery Disease, Hypertension, History: Type II Diabetes, Osteoarthritis, Neuropathy Photos Wound Measurements Length: (cm) 1 Width: (cm) 1.9 Depth: (cm) 0.2 Area: (cm) 1.492 Volume: (cm) 0.298 % Reduction in Area: -5.5% % Reduction in Volume: -5.3% Epithelialization: Small (1-33%) Tunneling: No Undermining: No Wound Description Classification: Grade 1 Wound Margin: Flat and Intact Exudate Amount: Medium Exudate Type: Serosanguineous Exudate Color: red, brown Foul Odor After Cleansing: No Slough/Fibrino Yes Wound Bed Granulation Amount: Small (1-33%) Exposed  Structure Granulation Quality: Pink Fascia Exposed: No Necrotic Amount: Large (67-100%) Fat Layer (Subcutaneous Tissue) Exposed: Yes Necrotic Quality: Adherent Slough Tendon Exposed: No Muscle Exposed: No Joint Exposed: No Bone Exposed: No Treatment Notes Wound #10 (Calcaneus) Wound Laterality: Left Cleanser Peri-Wound [name] LEMARCUS, BAGGERLY ([id number]) [phone] Page 9 of 9 Topical Primary Dressing IODOFLEX 0.9% Cadexomer Iodine Pad Discharge Instruction: Apply Iodoflex to wound bed only as directed. Secondary Dressing Zetuvit Plus 4x8 (in/in) Secured With Compression Wrap 3-LAYER WRAP - Profore Lite LF 3 Multilayer Compression Bandaging System Discharge Instruction: Apply 3 multi-layer wrap as prescribed. Compression Stockings Add-Ons Electronic Signature(s) Signed: 10/06/2022 4:44:06 PM By: Massie Kluver Signed: 10/06/2022 5:11:45 PM By: [name], BSN, RN, CWS, [name] RN, BSN Entered By: Massie Kluver on 10/06/2022 14:57:55 -------------------------------------------------------------------------------- Vitals Details Patient Name: Date of Service: [name], [name]. 10/06/2022 2:15 PM Medical Record Number: [id number] Patient Account Number: [id number] Date of Birth/Sex: Treating RN: [name] 20, 1946 (76 y.o. [name], [name] Primary [name] Katerin Negrete: Fulton Reek Other Clinician: Massie Kluver Referring Pamelia Botto: Treating Baelyn [name]/Extender: [name] in Treatment: 15 Vital Signs Time Taken: 14:45 Temperature (F): 98.2 Height (in): 70 Pulse (bpm): 67 Weight (lbs): 265 Respiratory Rate (breaths/min): 18 Body Mass Index (BMI): 38 Blood Pressure (mmHg): 134/76 Reference Range: 80 - 120 mg / dl Electronic Signature(s) Signed: 10/06/2022 4:44:06 PM By: Massie Kluver Entered By: Massie Kluver on 10/06/2022 14:48:16

## 2022-10-13 ENCOUNTER — Encounter: Payer: Medicare HMO | Admitting: Internal Medicine

## 2022-10-13 DIAGNOSIS — E11621 Type 2 diabetes mellitus with foot ulcer: Secondary | ICD-10-CM | POA: Diagnosis not present

## 2022-10-14 NOTE — Progress Notes (Signed)
MARION, ROSENBERRY ([id number]) [id number] Page 1 of 9 Visit Report for 10/13/2022 Arrival Information Details Patient Name: Date of Service: [name], GEO [facility] J. 10/13/2022 2:15 PM Medical Record Number: [id number] Patient Account Number: [id number] Date of Birth/Sex: Treating RN: [birth year] (76 y.o. [name] Primary Care : Fulton Reek Other Clinician: Referring : Treating /Extender: RO BSO N, MICHA EL Ginny Forth in Treatment: 16 Visit Information History Since Last Visit Added or deleted any medications: No Patient Arrived: [name] Any new allergies or adverse reactions: No Arrival Time: 14:37 Had a fall or experienced change in No Accompanied By: self activities of daily living that may affect Transfer Assistance: None risk of falls: Patient Identification Verified: Yes Hospitalized since last visit: No Secondary Verification Process Completed: Yes Has Dressing in Place as Prescribed: Yes Patient Requires Transmission-Based Precautions: No Has Compression in Place as Prescribed: Yes Patient Has Alerts: Yes Pain Present Now: No Patient Alerts: Patient on Blood Thinner Warfarin Type II Diabetic Electronic Signature(s) Signed: 10/13/2022 3:02:45 PM By: Rosalio Loud MSN RN CNS WTA Previous Signature: 10/13/2022 3:02:21 PM Version By: Rosalio Loud MSN RN CNS WTA Entered By: Rosalio Loud on 10/13/2022 15:02:44 -------------------------------------------------------------------------------- Clinic Level of Care Assessment Details Patient Name: Date of Service: [name], [name] 10/13/2022 2:15 PM Medical Record Number: [id number] Patient Account Number: [id number] Date of Birth/Sex: Treating RN: [birth year] (76 y.o. [name] Primary Care : Fulton Reek Other Clinician: Referring : Treating /Extender: RO BSO Delane Ginger, MICHA EL Ginny Forth in Treatment: 16 Clinic  Level of Care Assessment Items TOOL 1 Quantity Score [] - 0 Use when EandM and Procedure is performed on INITIAL visit ASSESSMENTS - Nursing Assessment / Reassessment [] - 0 General Physical Exam (combine w/ comprehensive assessment (listed just below) when performed on new pt. evals) [] - 0 Comprehensive Assessment (HX, ROS, Risk Assessments, Wounds Hx, etc.) ASSESSMENTS - Wound and Skin Assessment / Reassessment BILLY, TURVEY ([id number]) [phone].pdf Page 2 of 9 [] - 0 Dermatologic / Skin Assessment (not related to wound area) ASSESSMENTS - Ostomy and/or Continence Assessment and Care [] - 0 Incontinence Assessment and Management [] - 0 Ostomy Care Assessment and Management (repouching, etc.) PROCESS - Coordination of Care [] - 0 Simple Patient / Family Education for ongoing care [] - 0 Complex (extensive) Patient / Family Education for ongoing care [] - 0 Staff obtains Programmer, systems, Records, T Results / Process Orders est [] - 0 Staff telephones HHA, Nursing Homes / Clarify orders / etc [] - 0 Routine Transfer to another Facility (non-emergent condition) [] - 0 Routine Hospital Admission (non-emergent condition) [] - 0 New Admissions / Biomedical engineer / Ordering NPWT Apligraf, etc. , [] - 0 Emergency Hospital Admission (emergent condition) PROCESS - Special Needs [] - 0 Pediatric / Minor Patient Management [] - 0 Isolation Patient Management [] - 0 Hearing / Language / Visual special needs [] - 0 Assessment of Community assistance (transportation, D/C planning, etc.) [] - 0 Additional assistance / Altered mentation [] - 0 Support Surface(s) Assessment (bed, cushion, seat, etc.) INTERVENTIONS - Miscellaneous [] - 0 External ear exam [] - 0 Patient Transfer (multiple staff / Civil Service fast streamer / Similar devices) [] - 0 Simple Staple / Suture removal (25 or less) [] - 0 Complex Staple / Suture removal (26 or more) [] -  0 Hypo/Hyperglycemic Management (do not check if billed separately) [] - 0 Ankle / Brachial Index (ABI) - do not check  if billed separately Has the patient been seen at the hospital within the last three years: Yes Total Score: 0 Level Of Care: ____ Electronic Signature(s) Signed: 10/13/2022 5:21:00 PM By: Rosalio Loud MSN RN CNS WTA Entered By: Rosalio Loud on 10/13/2022 15:10:35 -------------------------------------------------------------------------------- Compression Therapy Details Patient Name: Date of Service: [name], [name] 10/13/2022 2:15 PM Medical Record Number: [id number] Patient Account Number: [id number] Date of Birth/Sex: Treating RN: 10/17/1946 (76 y.o. [name] Primary Care : Fulton Reek Other Clinician: Referring : Treating /Extender: RO BSO N, MICHA EL Ginny Forth in Treatment: 16 Compression Therapy Performed for Wound Assessment: Wound #10 Left Calcaneus DEMITRIUS, CRASS ([id number]) [id number] Page 3 of 9 Performed By: Clinician Rosalio Loud, RN Compression Type: Three Layer Post Procedure Diagnosis Same as Pre-procedure Electronic Signature(s) Signed: 10/13/2022 5:21:00 PM By: Rosalio Loud MSN RN CNS WTA Entered By: Rosalio Loud on 10/13/2022 15:09:47 -------------------------------------------------------------------------------- Encounter Discharge Information Details Patient Name: Date of Service: [name], [name] 10/13/2022 2:15 PM Medical Record Number: [id number] Patient Account Number: [id number] Date of Birth/Sex: Treating RN: [birth year] (76 y.o. [name] Primary Care : Fulton Reek Other Clinician: Referring : Treating /Extender: RO BSO N, MICHA EL Ginny Forth in Treatment: 16 Encounter Discharge Information Items Post Procedure Vitals Discharge Condition: Stable Temperature (F): 99.2 Ambulatory Status:  Walker Pulse (bpm): 70 Discharge Destination: Home Respiratory Rate (breaths/min): 16 Transportation: Private Auto Blood Pressure (mmHg): 133/71 Accompanied By: self Schedule Follow-up Appointment: Yes Clinical Summary of Care: Electronic Signature(s) Signed: 10/13/2022 5:21:00 PM By: Rosalio Loud MSN RN CNS WTA Entered By: Rosalio Loud on 10/13/2022 15:12:19 -------------------------------------------------------------------------------- Lower Extremity Assessment Details Patient Name: Date of Service: [name], [name] 10/13/2022 2:15 PM Medical Record Number: [id number] Patient Account Number: [id number] Date of Birth/Sex: Treating RN: 1946/09/25 (76 y.o. [name] Primary Care : Fulton Reek Other Clinician: Referring : Treating /Extender: RO BSO Delane Ginger, MICHA EL Ginny Forth in Treatment: 16 Edema Assessment Assessed: [Left: No] [Right: No] [Left: Edema] Patrice Paradise: :] F[LeftFRANCISO, DIERKS ([id number])] [Right: [id number] Page 4 of 9] Calf Left: Right: Point of Measurement: 33 cm From Medial Instep 38 cm Ankle Left: Right: Point of Measurement: 11 cm From Medial Instep 23 cm Vascular Assessment Pulses: Dorsalis Pedis Palpable: [Left:Yes] Electronic Signature(s) Signed: 10/13/2022 3:03:39 PM By: Rosalio Loud MSN RN CNS WTA Entered By: Rosalio Loud on 10/13/2022 15:03:39 -------------------------------------------------------------------------------- Multi Wound Chart Details Patient Name: Date of Service: [name], [name] 10/13/2022 2:15 PM Medical Record Number: [id number] Patient Account Number: [id number] Date of Birth/Sex: Treating RN: Feb 23, 1946 (76 y.o. [name] Primary Care : Fulton Reek Other Clinician: Referring : Treating /Extender: RO BSO N, MICHA EL Ginny Forth in Treatment: 16 Vital Signs Height(in): 70 Pulse(bpm): 63 Weight(lbs):  265 Blood Pressure(mmHg): 133/71 Body Mass Index(BMI): 38 Temperature(F): 99.2 Respiratory Rate(breaths/min): 18 [10:Photos:] [N/A:N/A] Left Calcaneus N/A N/A Wound Location: Gradually Appeared N/A N/A Wounding 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 N/A N/A Secondary Etiology: Cataracts, Arrhythmia, Coronary N/A N/A Comorbid History: Artery Disease, Hypertension, Type II Diabetes, Osteoarthritis, Neuropathy 06/05/2022 N/A N/A Date Acquired: 16 N/A N/A Weeks of Treatment: Open N/A N/A Wound Status: No N/A N/A Wound Recurrence: 0.7x2x0.2 N/A N/A Measurements L x W x D (cm) 1.1 N/A N/A A (cm) : rea 0.22 N/A N/A Volume (cm) : [name] ([id number]) [id number] Page 5 of 9 22.20% N/A N/A %  Reduction in A rea: 22.30% N/A N/A % Reduction in Volume: Grade 1 N/A N/A Classification: Medium N/A N/A Exudate A mount: Serosanguineous N/A N/A Exudate Type: red, brown N/A N/A Exudate Color: Flat and Intact N/A N/A Wound Margin: Small (1-33%) N/A N/A Granulation A mount: Pink N/A N/A Granulation Quality: Large (67-100%) N/A N/A Necrotic A mount: Fat Layer (Subcutaneous Tissue): Yes N/A N/A Exposed Structures: Fascia: No Tendon: No Muscle: No Joint: No Bone: No Small (1-33%) N/A N/A Epithelialization: Treatment Notes Electronic Signature(s) Signed: 10/13/2022 3:03:45 PM By: Rosalio Loud MSN RN CNS WTA Entered By: Rosalio Loud on 10/13/2022 15:03:45 -------------------------------------------------------------------------------- Multi-Disciplinary Care Plan Details Patient Name: Date of Service: [name], [name] 10/13/2022 2:15 PM Medical Record Number: [id number] Patient Account Number: [id number] Date of Birth/Sex: Treating RN: Aug 14, 1946 (76 y.o. [name] Primary Care : Fulton Reek Other Clinician: Referring : Treating /Extender: RO  BSO N, MICHA EL Ginny Forth in Treatment: 16 Active Inactive Pressure Nursing Diagnoses: Knowledge deficit related to causes and risk factors for pressure ulcer development Knowledge deficit related to management of pressures ulcers Potential for impaired tissue integrity related to pressure, friction, moisture, and shear Goals: Patient will remain free from development of additional pressure ulcers Date Initiated: 06/19/2022 Date Inactivated: 07/28/2022 Target Resolution Date: 06/19/2022 Goal Status: Met Patient/caregiver will verbalize risk factors for pressure ulcer development Date Initiated: 06/19/2022 Date Inactivated: 07/28/2022 Target Resolution Date: 06/19/2022 Goal Status: Met Patient/caregiver will verbalize understanding of pressure ulcer management Date Initiated: 06/19/2022 Target Resolution Date: 08/14/2022 Goal Status: Active Interventions: Assess: immobility, friction, shearing, incontinence upon admission and as needed Assess offloading mechanisms upon admission and as needed Assess potential for pressure ulcer upon admission and as needed Provide education on pressure ulcers Notes: CAIDENCE, HIGASHI ([id number]) [phone].pdf Page 6 of 9 Wound/Skin Impairment Nursing Diagnoses: Impaired tissue integrity Goals: Patient/caregiver will verbalize understanding of skin care regimen Date Initiated: 06/19/2022 Date Inactivated: 07/10/2022 Target Resolution Date: 06/19/2022 Goal Status: Met Ulcer/skin breakdown will have a volume reduction of 30% by week 4 Date Initiated: 06/19/2022 Date Inactivated: 07/28/2022 Target Resolution Date: 07/17/2022 Goal Status: Unmet Unmet Reason: infection Ulcer/skin breakdown will have a volume reduction of 50% by week 8 Date Initiated: 07/28/2022 Target Resolution Date: 08/14/2022 Goal Status: Active Interventions: Assess patient/caregiver ability to obtain necessary supplies Assess patient/caregiver  ability to perform ulcer/skin care regimen upon admission and as needed Assess ulceration(s) every visit Provide education on ulcer and skin care Treatment Activities: Skin care regimen initiated : 06/19/2022 Topical wound management initiated : 06/19/2022 Notes: Electronic Signature(s) Signed: 10/13/2022 5:21:00 PM By: Rosalio Loud MSN RN CNS WTA Entered By: Rosalio Loud on 10/13/2022 15:11:26 -------------------------------------------------------------------------------- Pain Assessment Details Patient Name: Date of Service: [name], [name] 10/13/2022 2:15 PM Medical Record Number: [id number] Patient Account Number: [id number] Date of Birth/Sex: Treating RN: 04-Apr-1946 (76 y.o. [name] Primary Care : Fulton Reek Other Clinician: Referring : Treating /Extender: RO BSO N, MICHA EL Ginny Forth in Treatment: 16 Active Problems Location of Pain Severity and Description of Pain Patient Has Paino No Site Locations VERNAL, HRITZ ([id number]) [phone].pdf Page 7 of 9 Pain Management and Medication Current Pain Management: Electronic Signature(s) Signed: 10/13/2022 5:21:00 PM By: Rosalio Loud MSN RN CNS WTA Entered By: Rosalio Loud on 10/13/2022 15:03:20 -------------------------------------------------------------------------------- Patient/Caregiver Education Details Patient Name: Date of Service: [name], [name] 12/26/2023andnbsp2:15 PM Medical Record Number: [id number] Patient Account Number: [id number] Date of Birth/Gender: Treating RN: 03-10-1946 [phone]  y.o. [name] Primary Care Physician: Fulton Reek Other Clinician: Referring Physician: Treating Physician/Extender: RO BSO Delane Ginger, [place] Ginny Forth in Treatment: 16 Education Assessment Education Provided To: Patient Education Topics Provided Wound Debridement: Handouts: Wound Debridement Methods:  Explain/Verbal Responses: State content correctly Electronic Signature(s) Signed: 10/13/2022 5:21:00 PM By: Rosalio Loud MSN RN CNS WTA Entered By: Rosalio Loud on 10/13/2022 15:11:17 [name] ([id number]) [id number] Page 8 of 9 -------------------------------------------------------------------------------- Wound Assessment Details Patient Name: Date of Service: [name], [name] 10/13/2022 2:15 PM Medical Record Number: [id number] Patient Account Number: [id number] Date of Birth/Sex: Treating RN: Jan 13, 1946 (76 y.o. [name] Primary Care Shulem Mader: Fulton Reek Other Clinician: Referring Shaneika Rossa: Treating Navy Rothschild/Extender: RO BSO N, MICHA EL Ginny Forth in Treatment: 16 Wound Status Wound Number: 10 Primary Diabetic Wound/Ulcer of the Lower Extremity Etiology: Wound Location: Left Calcaneus Secondary Pressure Ulcer Wounding Event: Gradually Appeared Etiology: Date Acquired: 06/05/2022 Wound Open Weeks Of Treatment: 16 Status: Clustered Wound: No Comorbid Cataracts, Arrhythmia, Coronary Artery Disease, Hypertension, History: Type II Diabetes, Osteoarthritis, Neuropathy Photos Wound Measurements Length: (cm) 0.7 Width: (cm) 2 Depth: (cm) 0.2 Area: (cm) 1.1 Volume: (cm) 0.22 % Reduction in Area: 22.2% % Reduction in Volume: 22.3% Epithelialization: Small (1-33%) Wound Description Classification: Grade 1 Wound Margin: Flat and Intact Exudate Amount: Medium Exudate Type: Serosanguineous Exudate Color: red, brown Foul Odor After Cleansing: No Slough/Fibrino Yes Wound Bed Granulation Amount: Small (1-33%) Exposed Structure Granulation Quality: Pink Fascia Exposed: No Necrotic Amount: Large (67-100%) Fat Layer (Subcutaneous Tissue) Exposed: Yes Necrotic Quality: Adherent Slough Tendon Exposed: No Muscle Exposed: No Joint Exposed: No Bone Exposed: No Treatment Notes Wound #10 (Calcaneus) Wound  Laterality: Left Cleanser Peri-Wound Care KAYLAN, FRIEDMANN ([id number]) [phone].pdf Page 9 of 9 Topical Primary Dressing IODOFLEX 0.9% Cadexomer Iodine Pad Discharge Instruction: Apply Iodoflex to wound bed only as directed. Secondary Dressing Zetuvit Plus 4x8 (in/in) Secured With Compression Wrap 3-LAYER WRAP - Profore Lite LF 3 Multilayer Compression Bandaging System Discharge Instruction: Apply 3 multi-layer wrap as prescribed. Compression Stockings Add-Ons Electronic Signature(s) Signed: 10/13/2022 5:21:00 PM By: Rosalio Loud MSN RN CNS WTA Entered By: Rosalio Loud on 10/13/2022 14:50:55 -------------------------------------------------------------------------------- Vitals Details Patient Name: Date of Service: [name], [name] 10/13/2022 2:15 PM Medical Record Number: [id number] Patient Account Number: [id number] Date of Birth/Sex: Treating RN: 09/26/1946 (76 y.o. [name] Primary Care Stacie Knutzen: Fulton Reek Other Clinician: Referring Jenavee Laguardia: Treating Sinclaire Artiga/Extender: RO BSO N, MICHA EL Ginny Forth in Treatment: 16 Vital Signs Time Taken: 14:37 Temperature (F): 99.2 Height (in): 70 Pulse (bpm): 70 Weight (lbs): 265 Respiratory Rate (breaths/min): 18 Body Mass Index (BMI): 38 Blood Pressure (mmHg): 133/71 Reference Range: 80 - 120 mg / dl Electronic Signature(s) Signed: 10/13/2022 3:02:53 PM By: Rosalio Loud MSN RN CNS WTA Entered By: Rosalio Loud on 10/13/2022 15:02:53

## 2022-10-14 NOTE — Progress Notes (Signed)
RISHAB, STOUDT ([id number]) [id number] Page 1 of 10 Visit Report for 10/13/2022 Debridement Details Patient Name: Date of Service: [name], [name] [name] 10/13/2022 2:15 PM Medical Record Number: [id number] Patient Account Number: [id number] Date of Birth/Sex: Treating RN: 11-26-1945 (76 y.o. [name] Primary Care Provider: Fulton Reek Other Clinician: Referring Provider: Treating Provider/Extender: RO BSO Delane Ginger, MICHA EL Ginny Forth in Treatment: 16 Debridement Performed for Assessment: Wound #10 Left Calcaneus Performed By: Physician Ricard Dillon, MD Debridement Type: Debridement Severity of Tissue Pre Debridement: Fat layer exposed Level of Consciousness (Pre-procedure): Awake and Alert Pre-procedure Verification/Time Out No Taken: Start Time: 15:07 T Area Debrided (L x W): otal 0.7 (cm) x 2 (cm) = 1.4 (cm) Tissue and other material debrided: Viable, Non-Viable, Slough, Subcutaneous, Slough Level: Skin/Subcutaneous Tissue Debridement Description: Excisional Instrument: Curette Bleeding: Minimum Hemostasis Achieved: Silver Nitrate Response to Treatment: Procedure was tolerated well Level of Consciousness (Post- Awake and Alert procedure): Post Debridement Measurements of Total Wound Length: (cm) 0.7 Width: (cm) 2 Depth: (cm) 0.3 Volume: (cm) 0.33 Character of Wound/Ulcer Post Debridement: Stable Severity of Tissue Post Debridement: Fat layer exposed Post Procedure Diagnosis Same as Pre-procedure Electronic Signature(s) Signed: 10/13/2022 4:30:46 PM By: Linton Ham MD Signed: 10/13/2022 5:21:00 PM By: Rosalio Loud MSN RN CNS WTA Entered By: Rosalio Loud on 10/13/2022 15:10:11 -------------------------------------------------------------------------------- HPI Details Patient Name: Date of Service: [name], [name] [name] 10/13/2022 2:15 PM [name] ([id number]) [id number] Page  2 of 10 Medical Record Number: [id number] Patient Account Number: [id number] Date of Birth/Sex: Treating RN: 1946/08/12 (76 y.o. [name] Primary Care Provider: Fulton Reek Other Clinician: Referring Provider: Treating Provider/Extender: RO BSO N, MICHA EL Ginny Forth in Treatment: 16 History of Present Illness HPI Description: 09/24/2020 on evaluation today patient presents today for a heel ulcer that he tells me has been present for about 2 years. He has been seeing podiatry and they have been attempting to manage this including what sounds to be a total contact cast, Unna boot, and just standard dressings otherwise as well. Most recently has been using triple antibiotic ointment. With that being said unfortunately despite everything he really has not had any significant improvement. He tells me that he cannot even really remember exactly how this began but he presumed it may have rubbed on his shoes or something of that nature. 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 He tells me that this does seem to help. Fortunately there is no signs of active infection at this time. His most recent hemoglobin A1c was 8.1 though he knows this was this year he cannot tell me the exact time. His fluid pills currently to help with some of the lower extremity edema although he does obviously have signs of venous stasis/lymphedema. Currently there is no evidence of active infection. No fevers, chills, nausea, vomiting, or diarrhea. Patient has had fairly recent ABIs which were performed on 07/19/2020 and revealed that he has normal findings in both the ankle and toe locations bilaterally. His ABI on the right was  1.09 on the left was 1.08 with a TBI on the right of 0.88 and on the left of 0.94. Triphasic flow was noted  throughout. 10/08/2020 on evaluation today patient appears to be doing pretty well in regard to his left heel currently in fact this is doing a great job and seems to be healing quite nicely. Unfortunately on his right leg he had a pile of wood that actually fell on him injuring his right leg this is somewhat erythematous has me concerned little bit about cellulitis though there is not really a good area to culture at this point. 10/24/2020 upon evaluation today patient appears to be doing well with regard to his heel ulcer. He is showing signs of improvement which is great news. His right leg is completely healed. Overall I feel like he is doing excellent and there is no signs of infection. 11/07/2020 upon evaluation today patient appears to be doing well with regard to his heel ulcer. He tells me that last week when he was unable to come in his wife actually thought that the wound was very close to closing if not closed. Then it began to "reopen again". I really feel like what may have happened as the collagen may have dried over the wound bed and that because that misunderstanding with thinking that the wound was healing. With that being said I did not see it last week I do not know that for certain. Either way I feel like he is doing great today I see no signs of infection at this point. 11/26/2020 upon inspection today patient appears to be doing decently well in regard to his heel ulcer. He has been tolerating the dressing changes without complication. Fortunately there is no sign of active infection at this time. No fevers, chills, nausea, vomiting, or diarrhea. 12/10/2020 upon evaluation today patient appears to be doing fairly well in regard to the wound on his heel as well as what appears to be a new wound of the left first metatarsal head plantar aspect. This seems to be an area that was  callus that has split as the patient tells me has been trying to walk on his toes more has probably where this came from. With that being said there does not appear to be signs of active infection which is great news. 12/17/2020 upon evaluation today patient actually appears to be making good progress currently. Fortunately there is no evidence of active infection at this time. Overall I feel like he is very close to complete closure. 12/26/2020 upon evaluation today patient appears to be doing excellent in regard to his wounds. In fact I am not certain that these are not even completely healed on initial inspection. Overall I am very pleased with where things stand today. Good news is that they are healed he is actually get ready to go out of town and that will be helpful as well as he will be a full part of the time. Readmission: 02/04/2021 upon evaluation today patient appears to be doing well at this point in regard to his left heel that I previously saw him for. Unfortunately he is having issues with his right lower extremity. He has significant wounds at this point he also has erythema noted there is definite signs of cellulitis which is unfortunate. With that being said I think we do need to address this sooner rather than later. The good news is he did have a nice trip to the beach. He tells me that he had no issues during that time. 4/27; patient on Bactrim. Culture showed methicillin sensitive staph aureus therefore the Bactrim should be effective. 2 small areas  on the right leg are healed the area on the mid aspect of the tibia almost 100% covered in a very adherent necrotic debris. We have been using silver alginate 02/20/2021 upon evaluation today patient appears to be doing well with regard to his wound. He is showing signs of improvement which is great news overall very pleased with where things stand today. No fevers, chills, nausea, vomiting, or diarrhea. 02/27/2021 upon evaluation today  patient appears to be doing well with regard to his leg ulcer. He is tolerating the dressing changes and overall appears to be doing quite excellent. I am extremely pleased with where things stand and overall I think patient is making great progress. There is no sign of active infection at this time which is also great news. 03/06/2021 upon evaluation today patient appears to be doing excellent in regard to his wounds. In fact he appears to be completely healed today based on what I am seeing. This is excellent news and overall I am extremely pleased with where he stands. Overall the patient is happy to hear this as well this has been quite sometime coming. Readmission: 04/01/2021 patient unfortunately returns for readmission today. He tells me that he has been wearing his compression socks on the right leg daily and he does not really know what is going on and why his legs are doing what they are doing. We did therefore go ahead and probe deeper into exactly what has been going on with him today. Subsequently the patient tells me that when he gets up and what we would call "first thing in the morning" are really 2 different things". He does not tend to sleep well so he tells me that he will often wake up at 3:00 in the morning. He will then potentially going into the living room to get in his chair where he may read a book for a little while and then potentially fall asleep back in his chair. He then subsequently wake up around 5:00 or so and then get up and in his words "putter around". This often will end with him proceeding at some point around 8 AM or 9 AM to putting on his compression socks. With that being said this means that anywhere from the 3:00 in the morning till roughly around 9:00 in the morning he has no compression on yet he is sitting in his recliner, walking around and up and about, and this is at least 5 to 6 hours of noncompressed time. That may be our issue here but I did not  realize until we discussed this further today. Fortunately there does not appear to be any signs of active infection at this time which is great news. With that being said he has multiple wounds of the bilateral lower extremities. 04/10/2021 upon evaluation today patient appears to be doing well with regard to his legs for the most part. He does look like he had some injury where his wrap slid down nonetheless he did some "doctoring on them". I do feel like most of the areas honestly have cleared back up which is great news. There does not appear to be any signs of active infection at this time which is also great news. No fevers, chills, nausea, vomiting, or diarrhea. 04/18/2021 upon evaluation today patient appears to be doing well with regard to his wounds in fact he appears to be completely healed which is great news. Fortunately there does not appear to be any signs of active infection which is  great news. No fevers, chills, nausea, vomiting, or diarrhea. [name], [name] ([id number]) [id number] Page 3 of 10 07/28/2021 This is a now 76 year old man we have had in this clinic for quite a bit of this year. 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 He wears compression stockings. 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 He comes in an old running shoes although he says he wears better shoes at home. His history is that he noticed a blister in the right posterior heel. This open. He has been applying Neosporin to it currently the wound measures 2 x 1.4 cm. 100% slough covered. Not really offloading this in any rigorous way. Past medical history is essentially unchanged he has paroxysmal atrial fibrillation on Coumadin type 2 diabetes with a recent hemoglobin A1c of 7 on metformin. ABI in our clinic was 0.98 on the right 08/05/2021 upon evaluation  today patient appears to be doing well with regard to his heel ulcer. Fortunately there does not appear to be any signs of active infection at this time. Overall I been very pleased with where things seem to be currently as far as the wound healing is concerned. Again this is first a lot of seeing him Dr. Dellia Nims readmitted him last week. Nonetheless I think we are definitely making some progress here. 08/11/2021 upon evaluation today patient's wound on the heel actually showing signs of excellent improvement. I am actually very pleased with where things stand currently. No fevers, chills, nausea, vomiting, or diarrhea. There does not appear to be any need for sharp debridement today either which is also great news. 08/25/2021 upon evaluation today patient appears to be doing well with regard to his heel ulcer. This is actually looking significantly improved compared to last time I saw him. Fortunately there does not appear to be any evidence of active infection at this time. No fevers, chills, nausea, vomiting, or diarrhea. 09/01/2021 upon evaluation today patient appears to be doing decently well in regard to his wounds. Fortunately there does not appear to be any signs of active infection at this time which is great news and overall very pleased in that regard. I do not see any evidence of active infection systemically which is great news as well. No fevers, chills, nausea, vomiting, or diarrhea. I think that the heel is making excellent progress. 09/15/2021 upon evaluation today patient's heel unfortunately is significantly worse compared to what it was previous. I do believe that at this time he would benefit from switching back to something a little bit more offloading from the cushion shoe he has right now this is more of a heel protector what I really need is a Prevalon offloading boot which I think is good to do a lot better for him than just the small heel protector. 09/23/2021 upon evaluation  today patient appears to be doing a little better in regards to last week's visit. Overall I think that he is making good progress which is great news and there does not appear to be any evidence of active infection at this time. No fevers, chills, nausea, vomiting, or diarrhea. 09/30/2021 upon evaluation patient's heel is actually showing signs of good improvement which is great news. Fortunately there does not appear to be any evidence of active infection locally nor systemically at this point. No fevers, chills, nausea, vomiting, or diarrhea. 10/07/2021 upon evaluation today patient appears to be doing well currently  in regard to his heel ulcer. He has been tolerating the dressing changes without complication. Fortunately I do not see any signs of active infection at this time which is great news. No fevers, chills, nausea, vomiting, or diarrhea. 12/27; wound is measuring smaller. We have been using silver alginate 10/28/2021 upon evaluation today patient appears to be doing well currently in regard to his heel ulcer. I am actually very pleased with where things stand and I think he is making excellent progress. Fortunately I do not see any signs of active infection locally nor systemically at this point which is great news. Nonetheless I do believe that the patient would benefit from a new offloading shoe and has been using it Velcro is no longer functioning properly and he tells me that he almost tripped and fell because of that today. Obviously I do not want him following that would be very bad. 11/11/2021 upon evaluation today patient appears to be doing excellent in regard to his wound. In fact this appears to be completely healed based on what I see currently. I do not see any signs of anything open or draining and I did double check just by clearing away some of the callus around the edges of the wound to ensure that there was nothing still open or hiding underneath. Once this was cleared away  it appeared that the patient was doing significantly better at this time which is great news and there was nothing actually open. Readmission: 06-19-2022 upon evaluation today patient appears to be doing well currently in regard to his heel ulcer all things considered. He unfortunately is having a bit of breakdown in general as far as the heel is concerned not nearly as bad as what we noted last time he was here in the clinic. The good news is I do think that this should hopefully heal much more rapidly than what we noted last time. His past medical history has not changed he is on Coumadin. 07-03-2022 upon evaluation today patient appears to be doing better in regard to his heel. We will start to see some improvement here which is good news. Fortunately I do not see any evidence of infection locally or systemically at this time which is great news. No fevers, chills, nausea, vomiting, or diarrhea. 07-10-2022 upon evaluation today patient appears to be doing well currently in regard to his wound from the callus standpoint around the edges. Unfortunately from an actual wound bed standpoint he has some deep tissue injury noted at this point. Again I am not sure exactly what is going on that is causing this pressure to the region but he is definitely gotten pressure that is occurring and causing this to not heal as effectively as what we would like to see. I discussed with him today that he may need to at night when he sleeping use a pillow up underneath his calf in order to prevent the heel from touching the bed at all. I also think that it may be beneficial for him to monitor throughout his day and make sure there is no other time when he is getting the pressure to the heel. 07-23-2022 upon evaluation today patient appears to be doing poorly currently in regard to his heel ulcer. He has been tolerating the dressing changes without complication. Unfortunately I feel like he may be infected based on what I am  seeing. 07-28-2022 I did review patient's culture results which showed positive for Proteus as well as Staphylococcus both of which  would be treated with the Bactrim DS that I gave him at the last visit. This is good news and hopefully means that we should be able to apply the cast today as long as the wound itself does not appear to be doing poorly. Patient's wound bed actually showed signs of doing quite well and I am very pleased with where things stand and I do not see any signs of worsening overall which is great news. 08-04-2022 upon evaluation today patient appears to be doing well currently in regard to his heel ulcer. I am actually very pleased with where things stand and I do think this is looking significantly better. With regard to his right shin that is also showing signs of improvement which is great news. 08-10-2022 upon evaluation today patient's wound on the heel actually appears to be doing significantly better. In regard to the right anterior shin this is showing signs of healing it might even be completely healed but I still get a monitor I cannot get anything to fill away but also could not see where there was anything actually draining at this point. I am tending to think healed but I want a monitor 1 more week before I call it for sure. 08-17-2022 upon evaluation today patient appears to be doing well currently in regard to his heel. Fortunately he is tolerating the dressing changes without complication. I do not see any signs of infection and overall I think he is making good progress here. We did get approval for the Apligraf but I think he had a $295 co-pay that would have to be paid per application. With that being said as good as he is doing right now I do not think that is necessary but is still an option depending on how things progress if we need to speed this up quite a bit. 08/24/2022; this patient has a wound on his left heel in the setting of type 2 diabetes. We have  been using Prisma. He has a heel offloading boot 08-31-2022 upon evaluation today patient appears to be doing poorly in regard to his heel ulcer which is still showing signs of bruising I am just not as happy as I was when I saw him 2 weeks ago with this. He saw Dr. Dellia Nims last week. Also did not look good at that point apparently. Nonetheless I think that the patient [name], [name] ([id number]) [id number] Page 4 of 10 is still continuing to have some counterpressure getting to the wound bed unfortunately. 09-07-2022 upon evaluation today patient appears to be doing well currently in regard to his heel ulcer. He has been tolerating the dressing changes without complication. Fortunately there does not appear to be any signs of active infection locally or systemically at this time. Fortunately I do not see any evidence of active infection at this time. 09-15-2022 upon evaluation today patient appears to be doing well currently in regard to his legs which in fact appear to be completely healed this is great news. With that being said his heel does have some erythema and warmth as well as drainage I am concerned about cellulitis at this location. 09-21-2022 upon evaluation today patient's wound is actually showing signs of being a little bit smaller but still we are not doing nearly as well as what I would like to see. Fortunately I do not see any signs of infection at this time which is great news and overall I am extremely pleased in that regard. With that being said  I do think that he needs to continue to elevate his leg although the edema is under great control with a compression wrap I think switching to a different dressing topically may be beneficial. My suggestion is to switch him over to a Iodoflex dressing. 09-28-2022 upon evaluation today patient appears to be doing well with regard to his heel ulcer. This is actually showing signs of improvement which is  great news and overall I am extremely pleased with where we stand today. 10-06-2022 upon evaluation today patient actually appears to be making excellent progress at this point. Fortunately there does not appear to be any signs of active infection locally nor systemically which is great news and overall I am extremely pleased with where we stand. I do feel like he is actually making some pretty good progress here as we switch to the wrap along with the Iodoflex. 12/26; patient has a wound on the tip of his left heel. This is not a weightbearing surface. He is using a heel offloading boot and carefully offloading this at other times during the day. He is using Iodoflex and Zetuvitunder 3 layer compression Electronic Signature(s) Signed: 10/13/2022 4:30:46 PM By: Linton Ham MD Entered By: Linton Ham on 10/13/2022 15:14:55 -------------------------------------------------------------------------------- Physical Exam Details Patient Name: Date of Service: [name], [name] [name] 10/13/2022 2:15 PM Medical Record Number: [id number] Patient Account Number: [id number] Date of Birth/Sex: Treating RN: 03-23-1946 (76 y.o. [name] Primary Care Provider: Fulton Reek Other Clinician: Referring Provider: Treating Provider/Extender: RO BSO N, Bee EL Ginny Forth in Treatment: 16 Constitutional Sitting or standing Blood Pressure is within target range for patient.. Pulse regular and within target range for patient.Marland Kitchen Respirations regular, non-labored and within target range.. Temperature is normal and within the target range for the patient.. Notes Wound exam; under careful inspection the wound has nonviable tissue on the surface I used a #3 curette to remove this removing necrotic subcutaneous tissue and slough. Hemostasis with direct pressure and silver nitrate. There is no evidence of surrounding infection dimensions are measuring smaller Electronic Signature(s) Signed:  10/13/2022 4:30:46 PM By: Linton Ham MD Entered By: Linton Ham on 10/13/2022 15:15:46 Physician Orders Details -------------------------------------------------------------------------------- [name] ([id number]) [id number] Page 5 of 10 Patient Name: Date of Service: [name], [name] [name] 10/13/2022 2:15 PM Medical Record Number: [id number] Patient Account Number: [id number] Date of Birth/Sex: Treating RN: 02-24-1946 (76 y.o. [name] Primary Care Provider: Fulton Reek Other Clinician: Referring Provider: Treating Provider/Extender: RO BSO N, MICHA EL Ginny Forth in Treatment: 16 Verbal / Phone Orders: No Diagnosis Coding Follow-up Appointments Wound #10 Left Calcaneus Return Appointment in 1 week. Bathing/ Shower/ Hygiene May shower; gently cleanse wound with antibacterial soap, rinse and pat dry prior to dressing wounds Edema Control - Lymphedema / Segmental Compressive Device / Other 3 Layer Compression System for Lymphedema. - left lower leg Non-Wound Condition dditional non-wound orders/instructions: - Apply 40% Urea cream to dry cracked skin on feet. A Off-Loading Open toe surgical shoe with peg assist. Wound Treatment Wound #10 - Calcaneus Wound Laterality: Left Prim Dressing: IODOFLEX 0.9% Cadexomer Iodine Pad 3 x Per Week/30 Days ary Discharge Instructions: Apply Iodoflex to wound bed only as directed. Secondary Dressing: Zetuvit Plus 4x8 (in/in) 3 x Per Week/30 Days Compression Wrap: 3-LAYER WRAP - Profore Lite LF 3 Multilayer Compression Bandaging System 3 x Per Week/30 Days Discharge Instructions: Apply 3 multi-layer wrap as prescribed. Electronic Signature(s) Signed: 10/13/2022 4:30:46 PM By: Dellia Nims,  Legrand Como MD Signed: 10/13/2022 5:21:00 PM By: Rosalio Loud MSN RN CNS WTA Previous Signature: 10/13/2022 3:04:40 PM Version By: Rosalio Loud MSN RN CNS WTA Entered By: Rosalio Loud on 10/13/2022  15:10:26 -------------------------------------------------------------------------------- Problem List Details Patient Name: Date of Service: [name], [name] [name] 10/13/2022 2:15 PM Medical Record Number: [id number] Patient Account Number: [id number] Date of Birth/Sex: Treating RN: 1946-07-29 (76 y.o. [name] Primary Care Provider: Fulton Reek Other Clinician: Referring Provider: Treating Provider/Extender: RO BSO N, MICHA EL Ginny Forth in Treatment: 16 Active Problems ICD-10 Encounter Code Description Active Date MDM Diagnosis E11.621 Type 2 diabetes mellitus with foot ulcer 06/19/2022 No Yes KHYRAN, RIERA ([id number]) [phone].pdf Page 6 of 10 [phone] Non-pressure chronic ulcer of other part of left foot with fat layer exposed 06/19/2022 No Yes E11.42 Type 2 diabetes mellitus with diabetic polyneuropathy 06/19/2022 No Yes Inactive Problems Resolved Problems Electronic Signature(s) Signed: 10/13/2022 4:30:46 PM By: Linton Ham MD Entered By: Linton Ham on 10/13/2022 15:13:30 -------------------------------------------------------------------------------- Progress Note Details Patient Name: Date of Service: [name], [name] [name] 10/13/2022 2:15 PM Medical Record Number: [id number] Patient Account Number: [id number] Date of Birth/Sex: Treating RN: 11-May-1946 (76 y.o. [name] Primary Care Provider: Fulton Reek Other Clinician: Referring Provider: Treating Provider/Extender: RO BSO N, [place] EL Ginny Forth in Treatment: 16 Subjective History of Present Illness (HPI) 09/24/2020 on evaluation today patient presents today for a heel ulcer that he tells me has been present for about 2 years. He has been seeing podiatry and they have been attempting to manage this including what sounds to be a total contact cast, Unna boot, and just standard dressings otherwise as well. Most recently has been using triple  antibiotic ointment. With that being said unfortunately despite everything he really has not had any significant improvement. He tells me that he cannot even really remember exactly how this began but he presumed it may have rubbed on his shoes or something of that nature. 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 He tells me that this does seem to help. Fortunately there is no signs of active infection at this time. His most recent hemoglobin A1c was 8.1 though he knows this was this year he cannot tell me the exact time. His fluid pills currently to help with some of the lower extremity edema although he does obviously have signs of venous stasis/lymphedema. Currently there is no evidence of active infection. No fevers, chills, nausea, vomiting, or diarrhea. Patient has had fairly recent ABIs which were performed on 07/19/2020 and revealed that he has normal findings in both the ankle and toe locations bilaterally. His ABI on the right was 1.09 on the left was 1.08 with a TBI on the right of 0.88 and on the left of 0.94. Triphasic flow was noted throughout. 10/08/2020 on evaluation today patient appears to be doing pretty well in regard to his left heel currently in fact this is doing a great job and seems to be healing quite nicely. Unfortunately on his right leg he had a pile of wood that actually fell on him injuring his right leg this is somewhat erythematous has me concerned little bit about cellulitis though there is  not really a good area to culture at this point. 10/24/2020 upon evaluation today patient appears to be doing well with regard to his heel ulcer. He is showing signs of improvement which is great news. His  right leg is completely healed. Overall I feel like he is doing excellent and there is no signs of infection. 11/07/2020 upon evaluation today patient appears to be doing well with regard to his heel ulcer. He tells me that last week when he was unable to come in his wife actually thought that the wound was very close to closing if not closed. Then it began to "reopen again". I really feel like what may have happened as the collagen may have dried over the wound bed and that because that misunderstanding with thinking that the wound was healing. With that being said I did not see it last week I do not know that for certain. Either way I feel like he is doing great today I see no signs of infection at this point. 11/26/2020 upon inspection today patient appears to be doing decently well in regard to his heel ulcer. He has been tolerating the dressing changes without complication. Fortunately there is no sign of active infection at this time. No fevers, chills, nausea, vomiting, or diarrhea. 12/10/2020 upon evaluation today patient appears to be doing fairly well in regard to the wound on his heel as well as what appears to be a new wound of the left first metatarsal head plantar aspect. This seems to be an area that was callus that has split as the patient tells me has been trying to walk on his toes more has probably where this came from. With that being said there does not appear to be signs of active infection which is great news. 12/17/2020 upon evaluation today patient actually appears to be making good progress currently. Fortunately there is no evidence of active infection at this time. Overall I feel like he is very close to complete closure. 12/26/2020 upon evaluation today patient appears to be doing excellent in regard to his wounds. In fact I am not certain that these are not even completely healed on initial inspection. Overall I am very pleased with where things stand today. Good news is that  they are healed he is actually get ready to go out of town and that will be helpful as well as he will be a full part of the time. [name], [name] ([id number]) [id number] Page 7 of 10 Readmission: 02/04/2021 upon evaluation today patient appears to be doing well at this point in regard to his left heel that I previously saw him for. Unfortunately he is having issues with his right lower extremity. He has significant wounds at this point he also has erythema noted there is definite signs of cellulitis which is unfortunate. With that being said I think we do need to address this sooner rather than later. The good news is he did have a nice trip to the beach. He tells me that he had no issues during that time. 4/27; patient on Bactrim. Culture showed methicillin sensitive staph aureus therefore the Bactrim should be effective. 2 small areas on the right leg are healed the area on the mid aspect of the tibia almost 100% covered in a very adherent necrotic debris. We have been using silver alginate 02/20/2021 upon evaluation today patient appears to be doing well with regard to his wound. He is showing signs of improvement which is great news overall very pleased  with where things stand today. No fevers, chills, nausea, vomiting, or diarrhea. 02/27/2021 upon evaluation today patient appears to be doing well with regard to his leg ulcer. He is tolerating the dressing changes and overall appears to be doing quite excellent. I am extremely pleased with where things stand and overall I think patient is making great progress. There is no sign of active infection at this time which is also great news. 03/06/2021 upon evaluation today patient appears to be doing excellent in regard to his wounds. In fact he appears to be completely healed today based on what I am seeing. This is excellent news and overall I am extremely pleased with where he stands. Overall the patient is happy to hear  this as well this has been quite sometime coming. Readmission: 04/01/2021 patient unfortunately returns for readmission today. He tells me that he has been wearing his compression socks on the right leg daily and he does not really know what is going on and why his legs are doing what they are doing. We did therefore go ahead and probe deeper into exactly what has been going on with him today. Subsequently the patient tells me that when he gets up and what we would call "first thing in the morning" are really 2 different things". He does not tend to sleep well so he tells me that he will often wake up at 3:00 in the morning. He will then potentially going into the living room to get in his chair where he may read a book for a little while and then potentially fall asleep back in his chair. He then subsequently wake up around 5:00 or so and then get up and in his words "putter around". This often will end with him proceeding at some point around 8 AM or 9 AM to putting on his compression socks. With that being said this means that anywhere from the 3:00 in the morning till roughly around 9:00 in the morning he has no compression on yet he is sitting in his recliner, walking around and up and about, and this is at least 5 to 6 hours of noncompressed time. That may be our issue here but I did not realize until we discussed this further today. Fortunately there does not appear to be any signs of active infection at this time which is great news. With that being said he has multiple wounds of the bilateral lower extremities. 04/10/2021 upon evaluation today patient appears to be doing well with regard to his legs for the most part. He does look like he had some injury where his wrap slid down nonetheless he did some "doctoring on them". I do feel like most of the areas honestly have cleared back up which is great news. There does not appear to be any signs of active infection at this time which is also great  news. No fevers, chills, nausea, vomiting, or diarrhea. 04/18/2021 upon evaluation today patient appears to be doing well with regard to his wounds in fact he appears to be completely healed which is great news. Fortunately there does not appear to be any signs of active infection which is great news. No fevers, chills, nausea, vomiting, or diarrhea. READMISSION 07/28/2021 This is a now 76 year old man we have had in this clinic for quite a bit of this year. 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 He wears compression stockings. 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 He comes in an old running shoes although he says he wears better shoes at home. His history is that he noticed a blister in the right posterior heel. This open. He has been applying Neosporin to it currently the wound measures 2 x 1.4 cm. 100% slough covered. Not really offloading this in any rigorous way. Past medical history is essentially unchanged he has paroxysmal atrial fibrillation on Coumadin type 2 diabetes with a recent hemoglobin A1c of 7 on metformin. ABI in our clinic was 0.98 on the right 08/05/2021 upon evaluation today patient appears to be doing well with regard to his heel ulcer. Fortunately there does not appear to be any signs of active infection at this time. Overall I been very pleased with where things seem to be currently as far as the wound healing is concerned. Again this is first a lot of seeing him Dr. Dellia Nims readmitted him last week. Nonetheless I think we are definitely making some progress here. 08/11/2021 upon evaluation today patient's wound on the heel actually showing signs of excellent improvement. I am actually very pleased with where things stand currently. No fevers, chills, nausea, vomiting, or diarrhea. There does not appear to be any need for sharp debridement today either which  is also great news. 08/25/2021 upon evaluation today patient appears to be doing well with regard to his heel ulcer. This is actually looking significantly improved compared to last time I saw him. Fortunately there does not appear to be any evidence of active infection at this time. No fevers, chills, nausea, vomiting, or diarrhea. 09/01/2021 upon evaluation today patient appears to be doing decently well in regard to his wounds. Fortunately there does not appear to be any signs of active infection at this time which is great news and overall very pleased in that regard. I do not see any evidence of active infection systemically which is great news as well. No fevers, chills, nausea, vomiting, or diarrhea. I think that the heel is making excellent progress. 09/15/2021 upon evaluation today patient's heel unfortunately is significantly worse compared to what it was previous. I do believe that at this time he would benefit from switching back to something a little bit more offloading from the cushion shoe he has right now this is more of a heel protector what I really need is a Prevalon offloading boot which I think is good to do a lot better for him than just the small heel protector. 09/23/2021 upon evaluation today patient appears to be doing a little better in regards to last week's visit. Overall I think that he is making good progress which is great news and there does not appear to be any evidence of active infection at this time. No fevers, chills, nausea, vomiting, or diarrhea. 09/30/2021 upon evaluation patient's heel is actually showing signs of good improvement which is great news. Fortunately there does not appear to be any evidence of active infection locally nor systemically at this point. No fevers, chills, nausea, vomiting, or diarrhea. 10/07/2021 upon evaluation today patient appears to be doing well currently in regard to his heel ulcer. He has been tolerating the dressing changes  without complication. Fortunately I do not see any signs of active infection at this time which is great news. No fevers, chills, nausea, vomiting, or diarrhea. 12/27; wound is measuring smaller. We have been using silver alginate 10/28/2021 upon evaluation today patient appears to be doing well currently in regard to his heel ulcer.  I am actually very pleased with where things stand and I think he is making excellent progress. Fortunately I do not see any signs of active infection locally nor systemically at this point which is great news. Nonetheless I do believe that the patient would benefit from a new offloading shoe and has been using it Velcro is no longer functioning properly and he tells me that he almost tripped and fell because of that today. Obviously I do not want him following that would be very bad. 11/11/2021 upon evaluation today patient appears to be doing excellent in regard to his wound. In fact this appears to be completely healed based on what I see [name], [name] ([id number]) [phone].pdf Page 8 of 10 currently. I do not see any signs of anything open or draining and I did double check just by clearing away some of the callus around the edges of the wound to ensure that there was nothing still open or hiding underneath. Once this was cleared away it appeared that the patient was doing significantly better at this time which is great news and there was nothing actually open. Readmission: 06-19-2022 upon evaluation today patient appears to be doing well currently in regard to his heel ulcer all things considered. He unfortunately is having a bit of breakdown in general as far as the heel is concerned not nearly as bad as what we noted last time he was here in the clinic. The good news is I do think that this should hopefully heal much more rapidly than what we noted last time. His past medical history has not changed he is on Coumadin. 07-03-2022 upon  evaluation today patient appears to be doing better in regard to his heel. We will start to see some improvement here which is good news. Fortunately I do not see any evidence of infection locally or systemically at this time which is great news. No fevers, chills, nausea, vomiting, or diarrhea. 07-10-2022 upon evaluation today patient appears to be doing well currently in regard to his wound from the callus standpoint around the edges. Unfortunately from an actual wound bed standpoint he has some deep tissue injury noted at this point. Again I am not sure exactly what is going on that is causing this pressure to the region but he is definitely gotten pressure that is occurring and causing this to not heal as effectively as what we would like to see. I discussed with him today that he may need to at night when he sleeping use a pillow up underneath his calf in order to prevent the heel from touching the bed at all. I also think that it may be beneficial for him to monitor throughout his day and make sure there is no other time when he is getting the pressure to the heel. 07-23-2022 upon evaluation today patient appears to be doing poorly currently in regard to his heel ulcer. He has been tolerating the dressing changes without complication. Unfortunately I feel like he may be infected based on what I am seeing. 07-28-2022 I did review patient's culture results which showed positive for Proteus as well as Staphylococcus both of which would be treated with the Bactrim DS that I gave him at the last visit. This is good news and hopefully means that we should be able to apply the cast today as long as the wound itself does not appear to be doing poorly. Patient's wound bed actually showed signs of doing quite well and I am very  pleased with where things stand and I do not see any signs of worsening overall which is great news. 08-04-2022 upon evaluation today patient appears to be doing well currently in  regard to his heel ulcer. I am actually very pleased with where things stand and I do think this is looking significantly better. With regard to his right shin that is also showing signs of improvement which is great news. 08-10-2022 upon evaluation today patient's wound on the heel actually appears to be doing significantly better. In regard to the right anterior shin this is showing signs of healing it might even be completely healed but I still get a monitor I cannot get anything to fill away but also could not see where there was anything actually draining at this point. I am tending to think healed but I want a monitor 1 more week before I call it for sure. 08-17-2022 upon evaluation today patient appears to be doing well currently in regard to his heel. Fortunately he is tolerating the dressing changes without complication. I do not see any signs of infection and overall I think he is making good progress here. We did get approval for the Apligraf but I think he had a $295 co-pay that would have to be paid per application. With that being said as good as he is doing right now I do not think that is necessary but is still an option depending on how things progress if we need to speed this up quite a bit. 08/24/2022; this patient has a wound on his left heel in the setting of type 2 diabetes. We have been using Prisma. He has a heel offloading boot 08-31-2022 upon evaluation today patient appears to be doing poorly in regard to his heel ulcer which is still showing signs of bruising I am just not as happy as I was when I saw him 2 weeks ago with this. He saw Dr. Dellia Nims last week. Also did not look good at that point apparently. Nonetheless I think that the patient is still continuing to have some counterpressure getting to the wound bed unfortunately. 09-07-2022 upon evaluation today patient appears to be doing well currently in regard to his heel ulcer. He has been tolerating the dressing changes  without complication. Fortunately there does not appear to be any signs of active infection locally or systemically at this time. Fortunately I do not see any evidence of active infection at this time. 09-15-2022 upon evaluation today patient appears to be doing well currently in regard to his legs which in fact appear to be completely healed this is great news. With that being said his heel does have some erythema and warmth as well as drainage I am concerned about cellulitis at this location. 09-21-2022 upon evaluation today patient's wound is actually showing signs of being a little bit smaller but still we are not doing nearly as well as what I would like to see. Fortunately I do not see any signs of infection at this time which is great news and overall I am extremely pleased in that regard. With that being said I do think that he needs to continue to elevate his leg although the edema is under great control with a compression wrap I think switching to a different dressing topically may be beneficial. My suggestion is to switch him over to a Iodoflex dressing. 09-28-2022 upon evaluation today patient appears to be doing well with regard to his heel ulcer. This is actually showing signs of  improvement which is great news and overall I am extremely pleased with where we stand today. 10-06-2022 upon evaluation today patient actually appears to be making excellent progress at this point. Fortunately there does not appear to be any signs of active infection locally nor systemically which is great news and overall I am extremely pleased with where we stand. I do feel like he is actually making some pretty good progress here as we switch to the wrap along with the Iodoflex. 12/26; patient has a wound on the tip of his left heel. This is not a weightbearing surface. He is using a heel offloading boot and carefully offloading this at other times during the day. He is using Iodoflex and Zetuvitunder 3 layer  compression Objective Constitutional Sitting or standing Blood Pressure is within target range for patient.. Pulse regular and within target range for patient.Marland Kitchen Respirations regular, non-labored and within target range.. Temperature is normal and within the target range for the patient.. Vitals Time Taken: 2:37 PM, Height: 70 in, Weight: 265 lbs, BMI: 38, Temperature: 99.2 F, Pulse: 70 bpm, Respiratory Rate: 18 breaths/min, Blood Pressure: 133/71 mmHg. General Notes: Wound exam; under careful inspection the wound has nonviable tissue on the surface I used a #3 curette to remove this removing necrotic subcutaneous tissue and slough. Hemostasis with direct pressure and silver nitrate. There is no evidence of surrounding infection dimensions are measuring smaller Integumentary (Hair, Skin) [name], [name] ([id number]) [id number] Page 9 of 10 Wound #10 status is Open. Original cause of wound was Gradually Appeared. The date acquired was: 06/05/2022. The wound has been in treatment 16 weeks. The wound is located on the Left Calcaneus. The wound measures 0.7cm length x 2cm width x 0.2cm depth; 1.1cm^2 area and 0.22cm^3 volume. There is Fat Layer (Subcutaneous Tissue) exposed. There is a medium amount of serosanguineous drainage noted. The wound margin is flat and intact. There is small (1- 33%) pink granulation within the wound bed. There is a large (67-100%) amount of necrotic tissue within the wound bed including Adherent Slough. 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 There was a Excisional Skin/Subcutaneous  Tissue Debridement with a total area of 1.4 sq cm performed by Ricard Dillon, MD. With the following instrument(s): Curette to remove Viable and Non-Viable tissue/material. Material removed includes Subcutaneous Tissue and Slough and. No specimens were taken.A Minimum amount of bleeding was controlled with Silver Nitrate. The procedure was tolerated well. Post Debridement Measurements: 0.7cm length x 2cm width x 0.3cm depth; 0.33cm^3 volume. Character of Wound/Ulcer Post Debridement is stable. Severity of Tissue Post Debridement is: Fat layer exposed. 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 There was a Three Layer Compression Therapy Procedure by Rosalio Loud, RN. Post procedure Diagnosis Wound #10: Same as Pre-Procedure Plan Follow-up Appointments: Wound #10 Left Calcaneus: Return Appointment in 1 week. Bathing/ Shower/ Hygiene: May shower; gently cleanse wound with antibacterial soap, rinse and pat dry prior to dressing wounds Edema Control - Lymphedema / Segmental Compressive Device / Other: 3 Layer Compression System for Lymphedema. - left lower leg Non-Wound  Condition: Additional non-wound orders/instructions: - Apply 40% Urea cream to dry cracked skin on feet. Off-Loading: Open toe surgical shoe with peg assist. WOUND #10: - Calcaneus Wound Laterality: Left Prim Dressing: IODOFLEX 0.9% Cadexomer Iodine Pad 3 x Per Week/30 Days ary Discharge Instructions: Apply Iodoflex to wound bed only as directed. Secondary Dressing: Zetuvit Plus 4x8 (in/in) 3 x Per Week/30 Days Com pression Wrap: 3-LAYER WRAP - Profore Lite LF 3 Multilayer Compression Bandaging System 3 x Per Week/30 Days Discharge Instructions: Apply 3 multi-layer wrap as prescribed. 1. I am continuing with the same dressing which is Iodoflex as a primary dressing. 2. He tells me he is  carefully offloading this area 3. May continue to require mechanical debridements depending on the surface condition. He does have a rim of new skin inferiorly and dimensions overall are smaller Electronic Signature(s) Signed: 10/13/2022 4:30:46 PM By: Linton Ham MD Entered By: Linton Ham on 10/13/2022 15:16:33 [name] ([id number]) [id number] Page 10 of 10 -------------------------------------------------------------------------------- SuperBill Details Patient Name: Date of Service: [name], [name] 10/13/2022 Medical Record Number: [id number] Patient Account Number: [id number] Date of Birth/Sex: Treating RN: [birth year] (76 y.o. 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 Facility Procedures : CPT4 Code: [id number] Description: 88280 - DEB SUBQ TISSUE 20 SQ CM/< ICD-10 Diagnosis Description E11.621 Type 2 diabetes mellitus with foot ulcer 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sed Modifier: Quantity: 1 Physician Procedures : CPT4 Code Description Modifier [id number] - WC PHYS SUBQ TISS 20 SQ CM ICD-10 Diagnosis Description E11.621 Type 2 diabetes mellitus with foot ulcer 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sed Quantity: 1 Electronic Signature(s) Signed: 10/13/2022 4:30:46 PM By: Linton Ham MD Entered By: Linton Ham on 10/13/2022 15:16:48

## 2022-10-22 ENCOUNTER — Encounter: Payer: Medicare HMO | Attending: Physician Assistant | Admitting: Physician Assistant

## 2022-10-22 DIAGNOSIS — I480 Paroxysmal atrial fibrillation: Secondary | ICD-10-CM | POA: Insufficient documentation

## 2022-10-22 DIAGNOSIS — Z79899 Other long term (current) drug therapy: Secondary | ICD-10-CM | POA: Diagnosis not present

## 2022-10-22 DIAGNOSIS — L97522 Non-pressure chronic ulcer of other part of left foot with fat layer exposed: Secondary | ICD-10-CM | POA: Diagnosis not present

## 2022-10-22 DIAGNOSIS — E11621 Type 2 diabetes mellitus with foot ulcer: Secondary | ICD-10-CM | POA: Insufficient documentation

## 2022-10-22 DIAGNOSIS — Z7901 Long term (current) use of anticoagulants: Secondary | ICD-10-CM | POA: Diagnosis not present

## 2022-10-22 DIAGNOSIS — G8929 Other chronic pain: Secondary | ICD-10-CM | POA: Insufficient documentation

## 2022-10-22 DIAGNOSIS — Z952 Presence of prosthetic heart valve: Secondary | ICD-10-CM | POA: Insufficient documentation

## 2022-10-22 DIAGNOSIS — E1142 Type 2 diabetes mellitus with diabetic polyneuropathy: Secondary | ICD-10-CM | POA: Insufficient documentation

## 2022-10-22 NOTE — Progress Notes (Addendum)
FREMONT, SKALICKY ([id number]) [id number] Page 1 of 11 Visit Report for 10/22/2022 Chief Complaint Document Details Patient Name: Date of Service: [name], [name] [name] 10/22/2022 9:00 A M Medical Record Number: [id number] Patient Account Number: [id number] Date of Birth/Sex: Treating RN: 1946-09-02 (77 y.o. [name] Primary Care Provider: Fulton [name] Other Clinician: Massie [name] Referring Provider: Treating Provider/Extender: [name] Weeks in Treatment: 17 Information Obtained from: Patient Chief Complaint Left heel ulcer Electronic Signature(s) Signed: 10/22/2022 9:17:25 AM By: [name] Keeler PA-C Entered By: [name] on 10/22/2022 09:17:25 -------------------------------------------------------------------------------- Debridement Details Patient Name: Date of Service: [name], [name] [name] 10/22/2022 9:00 A M Medical Record Number: [id number] Patient Account Number: [id number] Date of Birth/Sex: Treating RN: Oct 14, 1946 (77 y.o. [name] Primary Care Provider: Fulton [name] Other Clinician: Massie [name] Referring Provider: Treating Provider/Extender: [name] in Treatment: 17 Debridement Performed for Assessment: Wound #10 Left Calcaneus Performed By: Physician [name] Sams., PA-C Debridement Type: Debridement Severity of Tissue Pre Debridement: Fat layer exposed Level of Consciousness (Pre-procedure): Awake and Alert Pre-procedure Verification/Time Out Yes - 09:49 Taken: Start Time: 09:49 T Area Debrided (L x W): otal 2 (cm) x 3 (cm) = 6 (cm) Tissue and other material debrided: Viable, Non-Viable, Callus, Slough, Subcutaneous, Biofilm, Slough Level: Skin/Subcutaneous Tissue Debridement Description: Excisional Instrument: Curette Bleeding: Minimum Hemostasis Achieved: Pressure Response to Treatment: Procedure was tolerated well Level of Consciousness (Post- Awake and  Alert procedure): [name] ([id number]) [id number] Page 2 of 11 Post Debridement Measurements of Total Wound Length: (cm) 1 Width: (cm) 2.3 Depth: (cm) 0.2 Volume: (cm) 0.361 Character of Wound/Ulcer Post Debridement: Stable Severity of Tissue Post Debridement: Fat layer exposed Post Procedure Diagnosis Same as Pre-procedure Electronic Signature(s) Signed: 10/22/2022 5:24:26 PM By: [name] Signed: 10/23/2022 1:38:11 PM By: [name] Keeler PA-C Signed: 10/23/2022 1:40:16 PM By: Rosalio Loud MSN RN CNS WTA Entered By: [name] on 10/22/2022 09:52:59 -------------------------------------------------------------------------------- HPI Details Patient Name: Date of Service: [name], [name] [name] 10/22/2022 9:00 A M Medical Record Number: [id number] Patient Account Number: [id number] Date of Birth/Sex: Treating RN: Jan 10, 1946 (77 y.o. [name] Primary Care Provider: Fulton [name] Other Clinician: Massie [name] Referring Provider: Treating Provider/Extender: [name] Weeks in Treatment: 17 History of Present Illness HPI Description: 09/24/2020 on evaluation today patient presents today for a heel ulcer that he tells me has been present for about 2 years. He has been seeing podiatry and they have been attempting to manage this including what sounds to be a total contact cast, Unna boot, and just standard dressings otherwise as well. Most recently has been using triple antibiotic ointment. With that being said unfortunately despite everything he really has not had any significant improvement. He tells me that he cannot even really remember exactly how this began but he presumed it may have rubbed on his shoes or something of that nature. 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 He tells me that this does seem to help. Fortunately there is no signs of active infection at this time. His most recent hemoglobin A1c was 8.1 though he knows this was this year he cannot tell me  the exact time. His fluid pills currently to help with some of the lower extremity edema although he does obviously have signs of venous stasis/lymphedema. Currently there is no evidence of active infection. No fevers, chills, nausea, vomiting, or diarrhea. Patient has had fairly recent ABIs which were performed on 07/19/2020 and revealed that he has normal findings in both the ankle and toe locations bilaterally. His ABI on the right was 1.09 on the left was 1.08 with a TBI on the right of 0.88 and on the left of 0.94. Triphasic flow was noted throughout. 10/08/2020 on evaluation today patient appears to be doing pretty well in regard to his left heel currently in fact this is doing a great job and seems to be healing quite nicely. Unfortunately on his right leg he had a pile of wood that actually fell on him injuring his right leg this is somewhat erythematous has me concerned little bit about cellulitis though there is not really a good area to culture at this point. 10/24/2020 upon evaluation today patient appears to be doing well with regard to his heel ulcer. He is showing signs of improvement which is great news. His right leg is completely healed. Overall I feel like he is doing excellent and there is no signs of infection. 11/07/2020 upon evaluation today patient appears to be doing well with regard to his heel ulcer. He tells me that last week when he was unable to come in his wife actually thought that the wound was very close to closing if not closed. Then it began to "reopen again". I really feel like what may have happened as the collagen may have dried over the  wound bed and that because that misunderstanding with thinking that the wound was healing. With that being said I did not see it last week I do not know that for certain. Either way I feel like he is doing great today I see no signs of infection at this point. 11/26/2020 upon inspection today patient appears to be doing decently well in regard to his heel ulcer. He has been tolerating the dressing changes without complication. Fortunately there is no sign of active infection at this time. No fevers, chills, nausea, vomiting, or diarrhea. 12/10/2020 upon evaluation today patient appears to be doing fairly well in regard to the wound on his heel as well as what appears to be a new wound of the left first metatarsal head plantar aspect. This seems to be an area that was callus that has split as the patient tells me has been trying to walk on his toes more has probably where this came from. With that being said there does not appear to be signs of active infection which is great news. 12/17/2020 upon evaluation today patient actually appears to be making good progress currently. Fortunately there is no evidence of active infection at this time. Overall I feel like he is very close to complete closure. 12/26/2020 upon evaluation today patient appears to be doing excellent in regard to his wounds. In fact I am not certain that these are not even completely healed on initial inspection. Overall I am very pleased with where things stand today. Good news is that they are healed he is actually get ready to go out of town and that will be helpful as well as he will be a full part of the time. [name], [name] ([id number]) [id number] Page 3 of 11 Readmission: 02/04/2021 upon evaluation today patient appears to be doing well  at this point in regard to his left heel that I previously saw him for. Unfortunately he is having issues with his right lower extremity. He has significant wounds at  this point he also has erythema noted there is definite signs of cellulitis which is unfortunate. With that being said I think we do need to address this sooner rather than later. The good news is he did have a nice trip to the beach. He tells me that he had no issues during that time. 4/27; patient on Bactrim. Culture showed methicillin sensitive staph aureus therefore the Bactrim should be effective. 2 small areas on the right leg are healed the area on the mid aspect of the tibia almost 100% covered in a very adherent necrotic debris. We have been using silver alginate 02/20/2021 upon evaluation today patient appears to be doing well with regard to his wound. He is showing signs of improvement which is great news overall very pleased with where things stand today. No fevers, chills, nausea, vomiting, or diarrhea. 02/27/2021 upon evaluation today patient appears to be doing well with regard to his leg ulcer. He is tolerating the dressing changes and overall appears to be doing quite excellent. I am extremely pleased with where things stand and overall I think patient is making great progress. There is no sign of active infection at this time which is also great news. 03/06/2021 upon evaluation today patient appears to be doing excellent in regard to his wounds. In fact he appears to be completely healed today based on what I am seeing. This is excellent news and overall I am extremely pleased with where he stands. Overall the patient is happy to hear this as well this has been quite sometime coming. Readmission: 04/01/2021 patient unfortunately returns for readmission today. He tells me that he has been wearing his compression socks on the right leg daily and he does not really know what is going on and why his legs are doing what they are doing. We did therefore go ahead and probe deeper into exactly what has been going on with him today. Subsequently the patient tells me that when he gets up and what  we would call "first thing in the morning" are really 2 different things". He does not tend to sleep well so he tells me that he will often wake up at 3:00 in the morning. He will then potentially going into the living room to get in his chair where he may read a book for a little while and then potentially fall asleep back in his chair. He then subsequently wake up around 5:00 or so and then get up and in his words "putter around". This often will end with him proceeding at some point around 8 AM or 9 AM to putting on his compression socks. With that being said this means that anywhere from the 3:00 in the morning till roughly around 9:00 in the morning he has no compression on yet he is sitting in his recliner, walking around and up and about, and this is at least 5 to 6 hours of noncompressed time. That may be our issue here but I did not realize until we discussed this further today. Fortunately there does not appear to be any signs of active infection at this time which is great news. With that being said he has multiple wounds of the bilateral lower extremities. 04/10/2021 upon evaluation today patient appears to be doing well with regard to his legs for the most  part. He does look like he had some injury where his wrap slid down nonetheless he did some "doctoring on them". I do feel like most of the areas honestly have cleared back up which is great news. There does not appear to be any signs of active infection at this time which is also great news. No fevers, chills, nausea, vomiting, or diarrhea. 04/18/2021 upon evaluation today patient appears to be doing well with regard to his wounds in fact he appears to be completely healed which is great news. Fortunately there does not appear to be any signs of active infection which is great news. No fevers, chills, nausea, vomiting, or diarrhea. READMISSION 07/28/2021 This is a now 77 year old man we have had in this clinic for quite a bit of this  year. 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 He wears compression stockings. 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 He comes in an old running shoes although he says he wears better shoes at home. His history is that he noticed a blister in the right posterior heel. This open. He has been applying Neosporin to it currently the wound measures 2 x 1.4 cm. 100% slough covered. Not really offloading this in any rigorous way. Past medical history is essentially unchanged he has paroxysmal atrial fibrillation on Coumadin type 2 diabetes with a recent hemoglobin A1c of 7 on metformin. ABI in our clinic was 0.98 on the right 08/05/2021 upon evaluation today patient appears to be doing well with regard to his heel ulcer. Fortunately there does not appear to be any signs of active infection at this time. Overall I been very pleased with where things seem to be currently as far as the wound healing is concerned. Again this is first a lot of seeing him Dr. Dellia Nims readmitted him last week. Nonetheless I think we are definitely making some progress here. 08/11/2021 upon evaluation today patient's wound on the heel actually showing signs of excellent improvement. I am actually very pleased with where things stand currently. No fevers, chills, nausea, vomiting, or diarrhea. There does not appear to be any need for sharp debridement today either which is also great news. 08/25/2021 upon evaluation today patient appears to be doing well with regard to his heel ulcer. This is actually looking significantly improved compared to last time I saw him. Fortunately there does not appear to be any evidence of active infection at this time. No fevers, chills, nausea, vomiting, or diarrhea. 09/01/2021 upon evaluation today patient appears to be doing decently well in regard to his wounds. Fortunately there does  not appear to be any signs of active infection at this time which is great news and overall very pleased in that regard. I do not see any evidence of active infection systemically which is great news as well. No fevers, chills, nausea, vomiting, or diarrhea. I think that the heel is making excellent progress. 09/15/2021 upon evaluation today patient's heel unfortunately is significantly worse compared to what it was previous. I do believe that at this time he would benefit from switching back to something a little bit more offloading from the cushion shoe he has right now this is more of a heel protector what I really need is a Prevalon offloading boot which I think is good to do a lot better for him than just the small heel protector. 09/23/2021 upon evaluation today patient appears to be doing  a little better in regards to last week's visit. Overall I think that he is making good progress which is great news and there does not appear to be any evidence of active infection at this time. No fevers, chills, nausea, vomiting, or diarrhea. 09/30/2021 upon evaluation patient's heel is actually showing signs of good improvement which is great news. Fortunately there does not appear to be any evidence of active infection locally nor systemically at this point. No fevers, chills, nausea, vomiting, or diarrhea. 10/07/2021 upon evaluation today patient appears to be doing well currently in regard to his heel ulcer. He has been tolerating the dressing changes without complication. Fortunately I do not see any signs of active infection at this time which is great news. No fevers, chills, nausea, vomiting, or diarrhea. 12/27; wound is measuring smaller. We have been using silver alginate 10/28/2021 upon evaluation today patient appears to be doing well currently in regard to his heel ulcer. I am actually very pleased with where things stand and I think he is making excellent progress. Fortunately I do not see any  signs of active infection locally nor systemically at this point which is great news. Nonetheless I do believe that the patient would benefit from a new offloading shoe and has been using it Velcro is no longer functioning properly and he tells me that he almost tripped and fell because of that today. Obviously I do not want him following that would be very bad. 11/11/2021 upon evaluation today patient appears to be doing excellent in regard to his wound. In fact this appears to be completely healed based on what I see [name], [name] ([id number]) [id number] Page 4 of 11 currently. I do not see any signs of anything open or draining and I did double check just by clearing away some of the callus around the edges of the wound to ensure that there was nothing still open or hiding underneath. Once this was cleared away it appeared that the patient was doing significantly better at this time which is great news and there was nothing actually open. Readmission: 06-19-2022 upon evaluation today patient appears to be doing well currently in regard to his heel ulcer all things considered. He unfortunately is having a bit of breakdown in general as far as the heel is concerned not nearly as bad as what we noted last time he was here in the clinic. The good news is I do think that this should hopefully heal much more rapidly than what we noted last time. His past medical history has not changed he is on Coumadin. 07-03-2022 upon evaluation today patient appears to be doing better in regard to his heel. We will start to see some improvement here which is good news. Fortunately I do not see any evidence of infection locally or systemically at this time which is great news. No fevers, chills, nausea, vomiting, or diarrhea. 07-10-2022 upon evaluation today patient appears to be doing well currently in regard to his wound from the callus standpoint around the edges. Unfortunately from an  actual wound bed standpoint he has some deep tissue injury noted at this point. Again I am not sure exactly what is going on that is causing this pressure to the region but he is definitely gotten pressure that is occurring and causing this to not heal as effectively as what we would like to see. I discussed with him today that he may need to at night when he sleeping use a pillow up underneath  his calf in order to prevent the heel from touching the bed at all. I also think that it may be beneficial for him to monitor throughout his day and make sure there is no other time when he is getting the pressure to the heel. 07-23-2022 upon evaluation today patient appears to be doing poorly currently in regard to his heel ulcer. He has been tolerating the dressing changes without complication. Unfortunately I feel like he may be infected based on what I am seeing. 07-28-2022 I did review patient's culture results which showed positive for Proteus as well as Staphylococcus both of which would be treated with the Bactrim DS that I gave him at the last visit. This is good news and hopefully means that we should be able to apply the cast today as long as the wound itself does not appear to be doing poorly. Patient's wound bed actually showed signs of doing quite well and I am very pleased with where things stand and I do not see any signs of worsening overall which is great news. 08-04-2022 upon evaluation today patient appears to be doing well currently in regard to his heel ulcer. I am actually very pleased with where things stand and I do think this is looking significantly better. With regard to his right shin that is also showing signs of improvement which is great news. 08-10-2022 upon evaluation today patient's wound on the heel actually appears to be doing significantly better. In regard to the right anterior shin this is showing signs of healing it might even be completely healed but I still get a monitor  I cannot get anything to fill away but also could not see where there was anything actually draining at this point. I am tending to think healed but I want a monitor 1 more week before I call it for sure. 08-17-2022 upon evaluation today patient appears to be doing well currently in regard to his heel. Fortunately he is tolerating the dressing changes without complication. I do not see any signs of infection and overall I think he is making good progress here. We did get approval for the Apligraf but I think he had a $295 co-pay that would have to be paid per application. With that being said as good as he is doing right now I do not think that is necessary but is still an option depending on how things progress if we need to speed this up quite a bit. 08/24/2022; this patient has a wound on his left heel in the setting of type 2 diabetes. We have been using Prisma. He has a heel offloading boot 08-31-2022 upon evaluation today patient appears to be doing poorly in regard to his heel ulcer which is still showing signs of bruising I am just not as happy as I was when I saw him 2 weeks ago with this. He saw Dr. Dellia Nims last week. Also did not look good at that point apparently. Nonetheless I think that the patient is still continuing to have some counterpressure getting to the wound bed unfortunately. 09-07-2022 upon evaluation today patient appears to be doing well currently in regard to his heel ulcer. He has been tolerating the dressing changes without complication. Fortunately there does not appear to be any signs of active infection locally or systemically at this time. Fortunately I do not see any evidence of active infection at this time. 09-15-2022 upon evaluation today patient appears to be doing well currently in regard to his legs which  in fact appear to be completely healed this is great news. With that being said his heel does have some erythema and warmth as well as drainage I am concerned  about cellulitis at this location. 09-21-2022 upon evaluation today patient's wound is actually showing signs of being a little bit smaller but still we are not doing nearly as well as what I would like to see. Fortunately I do not see any signs of infection at this time which is great news and overall I am extremely pleased in that regard. With that being said I do think that he needs to continue to elevate his leg although the edema is under great control with a compression wrap I think switching to a different dressing topically may be beneficial. My suggestion is to switch him over to a Iodoflex dressing. 09-28-2022 upon evaluation today patient appears to be doing well with regard to his heel ulcer. This is actually showing signs of improvement which is great news and overall I am extremely pleased with where we stand today. 10-06-2022 upon evaluation today patient actually appears to be making excellent progress at this point. Fortunately there does not appear to be any signs of active infection locally nor systemically which is great news and overall I am extremely pleased with where we stand. I do feel like he is actually making some pretty good progress here as we switch to the wrap along with the Iodoflex. 12/26; patient has a wound on the tip of his left heel. This is not a weightbearing surface. He is using a heel offloading boot and carefully offloading this at other times during the day. He is using Iodoflex and Zetuvitunder 3 layer compression 10-22-2022 upon evaluation today patient appears to be doing well currently in regard to his wound. He has been tolerating the dressing changes without complication. Fortunately there does not appear to be any signs of active infection locally nor systemically at this time. No fevers, chills, nausea, vomiting, or diarrhea. Electronic Signature(s) Signed: 10/22/2022 9:58:24 AM By: [name] Keeler PA-C Entered By: [name] on 10/22/2022  09:58:24 [name] ([id number]) [id number] Page 5 of 11 -------------------------------------------------------------------------------- Physical Exam Details Patient Name: Date of Service: [name], [name] [name] 10/22/2022 9:00 A M Medical Record Number: [id number] Patient Account Number: [id number] Date of Birth/Sex: Treating RN: July 26, 1946 (77 y.o. [name] Primary Care Provider: Fulton [name] Other Clinician: Massie [name] Referring Provider: Treating Provider/Extender: [name] Weeks in Treatment: 68 Constitutional Well-nourished and well-hydrated in no acute distress. Respiratory normal breathing without difficulty. Psychiatric this patient is able to make decisions and demonstrates good insight into disease process. Alert and Oriented x 3. pleasant and cooperative. Notes Patient's wound bed did require sharp debridement clearway some of the necrotic debris he tolerated that today without complication and postdebridement wound bed appears to be doing much better which is great news. No fevers, chills, nausea, vomiting, or diarrhea. Electronic Signature(s) Signed: 10/22/2022 9:58:40 AM By: [name] Keeler PA-C Entered By: [name] on 10/22/2022 09:58:39 -------------------------------------------------------------------------------- Physician Orders Details Patient Name: Date of Service: [name], [name] [name] 10/22/2022 9:00 A M Medical Record Number: [id number] Patient Account Number: [id number] Date of Birth/Sex: Treating RN: 03-28-1946 (77 y.o. 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 [name], [name] ([id number]) [id number] Page 6 of 11 Bathing/ Shower/ Hygiene May shower; gently cleanse wound with antibacterial soap, rinse and pat dry prior to dressing wounds Edema Control - Lymphedema / Segmental Compressive Device / Other 3 Layer Compression System for Lymphedema. - left lower leg Non-Wound Condition dditional non-wound orders/instructions: - Apply 40% Urea cream to dry cracked skin on feet. A Off-Loading Open toe surgical shoe with peg assist. Wound Treatment Wound #10 - Calcaneus Wound Laterality: Left Prim Dressing: IODOFLEX 0.9% Cadexomer Iodine Pad 3 x Per Week/30 Days ary Discharge Instructions: Apply Iodoflex to wound bed only as directed. Secondary Dressing: Zetuvit Plus 4x8 (in/in) 3 x Per Week/30 Days Compression Wrap: 3-LAYER WRAP - Profore Lite LF 3 Multilayer Compression Bandaging System 3 x Per Week/30 Days Discharge Instructions: Apply 3 multi-layer wrap as prescribed. Electronic Signature(s) Signed: 10/22/2022 5:24:26 PM By: [name] Signed: 10/23/2022 1:38:11 PM By: [name] Keeler PA-C Entered By: [name] on 10/22/2022 09:56:20 -------------------------------------------------------------------------------- Problem List Details Patient Name: Date of Service: [name], [name] [name] 10/22/2022 9:00 A M Medical Record Number: [id number] Patient Account Number: [id number] Date of Birth/Sex: Treating RN: 1945-10-30 (77 y.o. [name] Primary Care Provider: Fulton [name] Other Clinician: Massie [name] Referring Provider: Treating Provider/Extender: [name] Weeks in Treatment: 17 Active Problems ICD-10 Encounter Code Description Active Date MDM Diagnosis E11.621 Type 2 diabetes mellitus with foot ulcer 06/19/2022 No  Yes 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sed 06/19/2022 No Yes E11.42 Type 2 diabetes mellitus with diabetic polyneuropathy 06/19/2022 No Yes Inactive Problems Resolved Problems [name], [name] ([id number]) [id number] Page 7 of 11 Electronic Signature(s) Signed: 10/22/2022 9:17:22 AM By: [name] Keeler PA-C Entered By: [name] on 10/22/2022 09:17:21 -------------------------------------------------------------------------------- Progress Note Details Patient Name: Date of Service: [name], [name] [name] 10/22/2022 9:00 A M Medical Record Number: [id number] Patient Account Number: [id number] Date of Birth/Sex: Treating RN: 05/07/1946 (77 y.o. [name] Primary Care Provider: Fulton [name] Other Clinician: Massie [name] Referring Provider: Treating Provider/Extender: [name] Weeks in Treatment: 17 Subjective Chief Complaint Information obtained from Patient Left heel ulcer History of Present Illness (HPI) 09/24/2020 on evaluation today patient presents today for a heel ulcer that he tells me has been present for about 2 years. He has been seeing podiatry and they have been attempting to manage this including what sounds to be a total contact cast, Unna boot, and just standard dressings otherwise as well. Most recently has been using triple antibiotic ointment. With that being said unfortunately despite everything he really has not had any significant improvement. He tells me that he cannot even really remember exactly how this began but he presumed it may have rubbed on his shoes or something of that nature. 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 He also does have chronic pain in the way of neuropathy which he takes  medications for including Cymbalta and methadone. He tells me that this does seem to help. Fortunately there is no signs of active infection at this time. His most recent hemoglobin  A1c was 8.1 though he knows this was this year he cannot tell me the exact time. His fluid pills currently to help with some of the lower extremity edema although he does obviously have signs of venous stasis/lymphedema. Currently there is no evidence of active infection. No fevers, chills, nausea, vomiting, or diarrhea. Patient has had fairly recent ABIs which were performed on 07/19/2020 and revealed that he has normal findings in both the ankle and toe locations bilaterally. His ABI on the right was 1.09 on the left was 1.08 with a TBI on the right of 0.88 and on the left of 0.94. Triphasic flow was noted throughout. 10/08/2020 on evaluation today patient appears to be doing pretty well in regard to his left heel currently in fact this is doing a great job and seems to be healing quite nicely. Unfortunately on his right leg he had a pile of wood that actually fell on him injuring his right leg this is somewhat erythematous has me concerned little bit about cellulitis though there is not really a good area to culture at this point. 10/24/2020 upon evaluation today patient appears to be doing well with regard to his heel ulcer. He is showing signs of improvement which is great news. His right leg is completely healed. Overall I feel like he is doing excellent and there is no signs of infection. 11/07/2020 upon evaluation today patient appears to be doing well with regard to his heel ulcer. He tells me that last week when he was unable to come in his wife actually thought that the wound was very close to closing if not closed. Then it began to "reopen again". I really feel like what may have happened as the collagen may have dried over the wound bed and that because that misunderstanding with thinking that the wound was healing.  With that being said I did not see it last week I do not know that for certain. Either way I feel like he is doing great today I see no signs of infection at this point. 11/26/2020 upon inspection today patient appears to be doing decently well in regard to his heel ulcer. He has been tolerating the dressing changes without complication. Fortunately there is no sign of active infection at this time. No fevers, chills, nausea, vomiting, or diarrhea. 12/10/2020 upon evaluation today patient appears to be doing fairly well in regard to the wound on his heel as well as what appears to be a new wound of the left first metatarsal head plantar aspect. This seems to be an area that was callus that has split as the patient tells me has been trying to walk on his toes more has probably where this came from. With that being said there does not appear to be signs of active infection which is great news. 12/17/2020 upon evaluation today patient actually appears to be making good progress currently. Fortunately there is no evidence of active infection at this time. Overall I feel like he is very close to complete closure. 12/26/2020 upon evaluation today patient appears to be doing excellent in regard to his wounds. In fact I am not certain that these are not even completely healed on initial inspection. Overall I am very pleased with where things stand today. Good news is that they are healed he is actually get ready to go out of town and that will be helpful as well as he will be a full part of the time. Readmission: 02/04/2021 upon evaluation today patient  appears to be doing well at this point in regard to his left heel that I previously saw him for. Unfortunately he is having issues with his right lower extremity. He has significant wounds at this point he also has erythema noted there is definite signs of cellulitis which is unfortunate. With that being said I think we do need to address this sooner rather than  later. The good news is he did have a nice trip to the beach. He tells me that he had no issues during that time. 4/27; patient on Bactrim. Culture showed methicillin sensitive staph aureus therefore the Bactrim should be effective. 2 small areas on the right leg are healed the area on the mid aspect of the tibia almost 100% covered in a very adherent necrotic debris. We have been using silver alginate 02/20/2021 upon evaluation today patient appears to be doing well with regard to his wound. He is showing signs of improvement which is great news overall very pleased with where things stand today. No fevers, chills, nausea, vomiting, or diarrhea. [name], [name] ([id number]) [id number] Page 8 of 11 02/27/2021 upon evaluation today patient appears to be doing well with regard to his leg ulcer. He is tolerating the dressing changes and overall appears to be doing quite excellent. I am extremely pleased with where things stand and overall I think patient is making great progress. There is no sign of active infection at this time which is also great news. 03/06/2021 upon evaluation today patient appears to be doing excellent in regard to his wounds. In fact he appears to be completely healed today based on what I am seeing. This is excellent news and overall I am extremely pleased with where he stands. Overall the patient is happy to hear this as well this has been quite sometime coming. Readmission: 04/01/2021 patient unfortunately returns for readmission today. He tells me that he has been wearing his compression socks on the right leg daily and he does not really know what is going on and why his legs are doing what they are doing. We did therefore go ahead and probe deeper into exactly what has been going on with him today. Subsequently the patient tells me that when he gets up and what we would call "first thing in the morning" are really 2 different things". He does not  tend to sleep well so he tells me that he will often wake up at 3:00 in the morning. He will then potentially going into the living room to get in his chair where he may read a book for a little while and then potentially fall asleep back in his chair. He then subsequently wake up around 5:00 or so and then get up and in his words "putter around". This often will end with him proceeding at some point around 8 AM or 9 AM to putting on his compression socks. With that being said this means that anywhere from the 3:00 in the morning till roughly around 9:00 in the morning he has no compression on yet he is sitting in his recliner, walking around and up and about, and this is at least 5 to 6 hours of noncompressed time. That may be our issue here but I did not realize until we discussed this further today. Fortunately there does not appear to be any signs of active infection at this time which is great news. With that being said he has multiple wounds of the bilateral lower extremities. 04/10/2021 upon evaluation today  patient appears to be doing well with regard to his legs for the most part. He does look like he had some injury where his wrap slid down nonetheless he did some "doctoring on them". I do feel like most of the areas honestly have cleared back up which is great news. There does not appear to be any signs of active infection at this time which is also great news. No fevers, chills, nausea, vomiting, or diarrhea. 04/18/2021 upon evaluation today patient appears to be doing well with regard to his wounds in fact he appears to be completely healed which is great news. Fortunately there does not appear to be any signs of active infection which is great news. No fevers, chills, nausea, vomiting, or diarrhea. READMISSION 07/28/2021 This is a now 77 year old man we have had in this clinic for quite a bit of this year. He has been in here with bilateral lower extremity leg wounds probably secondary to  chronic venous insufficiency. He wears compression stockings. 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 He comes in an old running shoes although he says he wears better shoes at home. His history is that he noticed a blister in the right posterior heel. This open. He has been applying Neosporin to it currently the wound measures 2 x 1.4 cm. 100% slough covered. Not really offloading this in any rigorous way. Past medical history is essentially unchanged he has paroxysmal atrial fibrillation on Coumadin type 2 diabetes with a recent hemoglobin A1c of 7 on metformin. ABI in our clinic was 0.98 on the right 08/05/2021 upon evaluation today patient appears to be doing well with regard to his heel ulcer. Fortunately there does not appear to be any signs of active infection at this time. Overall I been very pleased with where things seem to be currently as far as the wound healing is concerned. Again this is first a lot of seeing him Dr. Dellia Nims readmitted him last week. Nonetheless I think we are definitely making some progress here. 08/11/2021 upon evaluation today patient's wound on the heel actually showing signs of excellent improvement. I am actually very pleased with where things stand currently. No fevers, chills, nausea, vomiting, or diarrhea. There does not appear to be any need for sharp debridement today either which is also great news. 08/25/2021 upon evaluation today patient appears to be doing well with regard to his heel ulcer. This is actually looking significantly improved compared to last time I saw him. Fortunately there does not appear to be any evidence of active infection at this time. No fevers, chills, nausea, vomiting, or diarrhea. 09/01/2021 upon evaluation today patient appears to be doing decently well in regard to his wounds. Fortunately there does not appear to be any signs of active infection at this time which is great news and  overall very pleased in that regard. I do not see any evidence of active infection systemically which is great news as well. No fevers, chills, nausea, vomiting, or diarrhea. I think that the heel is making excellent progress. 09/15/2021 upon evaluation today patient's heel unfortunately is significantly worse compared to what it was previous. I do believe that at this time he would benefit from switching back to something a little bit more offloading from the cushion shoe he has right now this is more of a heel protector what I really need is a Prevalon offloading boot which I think is good to do a lot better for him than  just the small heel protector. 09/23/2021 upon evaluation today patient appears to be doing a little better in regards to last week's visit. Overall I think that he is making good progress which is great news and there does not appear to be any evidence of active infection at this time. No fevers, chills, nausea, vomiting, or diarrhea. 09/30/2021 upon evaluation patient's heel is actually showing signs of good improvement which is great news. Fortunately there does not appear to be any evidence of active infection locally nor systemically at this point. No fevers, chills, nausea, vomiting, or diarrhea. 10/07/2021 upon evaluation today patient appears to be doing well currently in regard to his heel ulcer. He has been tolerating the dressing changes without complication. Fortunately I do not see any signs of active infection at this time which is great news. No fevers, chills, nausea, vomiting, or diarrhea. 12/27; wound is measuring smaller. We have been using silver alginate 10/28/2021 upon evaluation today patient appears to be doing well currently in regard to his heel ulcer. I am actually very pleased with where things stand and I think he is making excellent progress. Fortunately I do not see any signs of active infection locally nor systemically at this point which is great  news. Nonetheless I do believe that the patient would benefit from a new offloading shoe and has been using it Velcro is no longer functioning properly and he tells me that he almost tripped and fell because of that today. Obviously I do not want him following that would be very bad. 11/11/2021 upon evaluation today patient appears to be doing excellent in regard to his wound. In fact this appears to be completely healed based on what I see currently. I do not see any signs of anything open or draining and I did double check just by clearing away some of the callus around the edges of the wound to ensure that there was nothing still open or hiding underneath. Once this was cleared away it appeared that the patient was doing significantly better at this time which is great news and there was nothing actually open. Readmission: 06-19-2022 upon evaluation today patient appears to be doing well currently in regard to his heel ulcer all things considered. He unfortunately is having a bit of breakdown in general as far as the heel is concerned not nearly as bad as what we noted last time he was here in the clinic. The good news is I do think that this should hopefully heal much more rapidly than what we noted last time. His past medical history has not changed he is on Coumadin. 07-03-2022 upon evaluation today patient appears to be doing better in regard to his heel. We will start to see some improvement here which is good news. Fortunately I do not see any evidence of infection locally or systemically at this time which is great news. No fevers, chills, nausea, vomiting, or diarrhea. [name], [name] ([id number]) [id number] Page 9 of 11 07-10-2022 upon evaluation today patient appears to be doing well currently in regard to his wound from the callus standpoint around the edges. Unfortunately from an actual wound bed standpoint he has some deep tissue injury noted at this point.  Again I am not sure exactly what is going on that is causing this pressure to the region but he is definitely gotten pressure that is occurring and causing this to not heal as effectively as what we would like to see. I discussed with him today that  he may need to at night when he sleeping use a pillow up underneath his calf in order to prevent the heel from touching the bed at all. I also think that it may be beneficial for him to monitor throughout his day and make sure there is no other time when he is getting the pressure to the heel. 07-23-2022 upon evaluation today patient appears to be doing poorly currently in regard to his heel ulcer. He has been tolerating the dressing changes without complication. Unfortunately I feel like he may be infected based on what I am seeing. 07-28-2022 I did review patient's culture results which showed positive for Proteus as well as Staphylococcus both of which would be treated with the Bactrim DS that I gave him at the last visit. This is good news and hopefully means that we should be able to apply the cast today as long as the wound itself does not appear to be doing poorly. Patient's wound bed actually showed signs of doing quite well and I am very pleased with where things stand and I do not see any signs of worsening overall which is great news. 08-04-2022 upon evaluation today patient appears to be doing well currently in regard to his heel ulcer. I am actually very pleased with where things stand and I do think this is looking significantly better. With regard to his right shin that is also showing signs of improvement which is great news. 08-10-2022 upon evaluation today patient's wound on the heel actually appears to be doing significantly better. In regard to the right anterior shin this is showing signs of healing it might even be completely healed but I still get a monitor I cannot get anything to fill away but also could not see where there  was anything actually draining at this point. I am tending to think healed but I want a monitor 1 more week before I call it for sure. 08-17-2022 upon evaluation today patient appears to be doing well currently in regard to his heel. Fortunately he is tolerating the dressing changes without complication. I do not see any signs of infection and overall I think he is making good progress here. We did get approval for the Apligraf but I think he had a $295 co-pay that would have to be paid per application. With that being said as good as he is doing right now I do not think that is necessary but is still an option depending on how things progress if we need to speed this up quite a bit. 08/24/2022; this patient has a wound on his left heel in the setting of type 2 diabetes. We have been using Prisma. He has a heel offloading boot 08-31-2022 upon evaluation today patient appears to be doing poorly in regard to his heel ulcer which is still showing signs of bruising I am just not as happy as I was when I saw him 2 weeks ago with this. He saw Dr. Dellia Nims last week. Also did not look good at that point apparently. Nonetheless I think that the patient is still continuing to have some counterpressure getting to the wound bed unfortunately. 09-07-2022 upon evaluation today patient appears to be doing well currently in regard to his heel ulcer. He has been tolerating the dressing changes without complication. Fortunately there does not appear to be any signs of active infection locally or systemically at this time. Fortunately I do not see any evidence of active infection at this time. 09-15-2022 upon evaluation  today patient appears to be doing well currently in regard to his legs which in fact appear to be completely healed this is great news. With that being said his heel does have some erythema and warmth as well as drainage I am concerned about cellulitis at this location. 09-21-2022 upon evaluation today  patient's wound is actually showing signs of being a little bit smaller but still we are not doing nearly as well as what I would like to see. Fortunately I do not see any signs of infection at this time which is great news and overall I am extremely pleased in that regard. With that being said I do think that he needs to continue to elevate his leg although the edema is under great control with a compression wrap I think switching to a different dressing topically may be beneficial. My suggestion is to switch him over to a Iodoflex dressing. 09-28-2022 upon evaluation today patient appears to be doing well with regard to his heel ulcer. This is actually showing signs of improvement which is great news and overall I am extremely pleased with where we stand today. 10-06-2022 upon evaluation today patient actually appears to be making excellent progress at this point. Fortunately there does not appear to be any signs of active infection locally nor systemically which is great news and overall I am extremely pleased with where we stand. I do feel like he is actually making some pretty good progress here as we switch to the wrap along with the Iodoflex. 12/26; patient has a wound on the tip of his left heel. This is not a weightbearing surface. He is using a heel offloading boot and carefully offloading this at other times during the day. He is using Iodoflex and Zetuvitunder 3 layer compression 10-22-2022 upon evaluation today patient appears to be doing well currently in regard to his wound. He has been tolerating the dressing changes without complication. Fortunately there does not appear to be any signs of active infection locally nor systemically at this time. No fevers, chills, nausea, vomiting, or diarrhea. Objective Constitutional Well-nourished and well-hydrated in no acute distress. Vitals Time Taken: 9:03 AM, Height: 70 in, Weight: 265 lbs, BMI: 38, Temperature: 98.6 F, Pulse: 79 bpm,  Respiratory Rate: 18 breaths/min, Blood Pressure: 163/69 mmHg. Respiratory normal breathing without difficulty. Psychiatric this patient is able to make decisions and demonstrates good insight into disease process. Alert and Oriented x 3. pleasant and cooperative. General Notes: Patient's wound bed did require sharp debridement clearway some of the necrotic debris he tolerated that today without complication and postdebridement wound bed appears to be doing much better which is great news. No fevers, chills, nausea, vomiting, or diarrhea. Integumentary (Hair, Skin) Wound #10 status is Open. Original cause of wound was Gradually Appeared. The date acquired was: 06/05/2022. The wound has been in treatment 17 weeks. The wound is located on the Left Calcaneus. The wound measures 1cm length x 2.3cm width x 0.2cm depth; 1.806cm^2 area and 0.361cm^3 volume. There is Fat Layer (Subcutaneous Tissue) exposed. There is a medium amount of serosanguineous drainage noted. The wound margin is flat and intact. There is small (1-33%) pink granulation within the wound bed. There is a large (67-100%) amount of necrotic tissue within the wound bed including Adherent Slough. 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 There was a Excisional Skin/Subcutaneous Tissue Debridement with a total area of 6 sq cm performed by [name] Sams., PA-C. With the following instrument(s): Curette to remove Viable and Non-Viable tissue/material. Material removed includes Callus, Subcutaneous  Tissue, Slough, and Biofilm. A time out was conducted at 09:49, prior to the start of the procedure. A Minimum amount of bleeding was controlled with Pressure. The procedure was tolerated well. Post Debridement Measurements: 1cm length x 2.3cm width x 0.2cm depth; 0.361cm^3 volume. Character of Wound/Ulcer Post Debridement is stable. Severity of Tissue Post Debridement is: Fat layer exposed. 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 There was a Three Layer Compression Therapy Procedure with a pre-treatment ABI of 1 by [name]. Post procedure Diagnosis Wound #10: Same as Pre-Procedure Plan Follow-up Appointments: Wound #10 Left Calcaneus: Return Appointment in 1 week. Bathing/ Shower/ Hygiene: May shower; gently cleanse wound with antibacterial soap, rinse and pat dry prior to dressing wounds Edema Control - Lymphedema / Segmental Compressive Device / Other: 3 Layer Compression System for Lymphedema. - left lower leg Non-Wound Condition: Additional non-wound orders/instructions: - Apply 40% Urea cream to dry cracked skin on feet. Off-Loading: Open toe surgical shoe with peg assist. WOUND #10: - Calcaneus Wound Laterality: Left Prim Dressing: IODOFLEX 0.9% Cadexomer Iodine Pad 3 x Per Week/30 Days ary Discharge Instructions: Apply Iodoflex to wound bed only as directed. Secondary Dressing: Zetuvit Plus 4x8 (in/in) 3 x Per Week/30 Days Com pression Wrap: 3-LAYER WRAP - Profore Lite LF 3 Multilayer Compression Bandaging System 3 x Per Week/30 Days Discharge Instructions: Apply 3 multi-layer wrap as prescribed. 1. I am going to recommend that we have the patient going continue to monitor for any signs of infection or worsening. Based on what I am seeing I do feel like that he is doing better especially compared to last week Iodoflex seems to be doing a  good job here. 2. I am also can recommend that the patient should continue to monitor for any signs of infection or worsening of see if anything changes he can contact the office and let me know. We will see patient back for reevaluation in 1 week here in the clinic. If anything worsens or changes patient will contact our office for additional recommendations. Electronic Signature(s) Signed: 10/22/2022 9:59:25 AM By: [name] Keeler PA-C Entered By: [name] on 10/22/2022 09:59:25 [name] ([id number]) [id number] Page 11 of 11 -------------------------------------------------------------------------------- SuperBill Details Patient Name: Date of Service: [name], [name] [name] 10/22/2022 Medical Record Number: [id number] Patient Account Number: [id number] Date of Birth/Sex: Treating RN: 1946-07-11 (77 y.o. [name] Primary Care Provider: Fulton [name] Other Clinician: Massie [name] Referring Provider: Treating Provider/Extender: [name] Weeks in Treatment: 17 Diagnosis Coding ICD-10 Codes Code Description E11.621 Type 2 diabetes mellitus with foot ulcer 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sed E11.42 Type 2 diabetes mellitus with diabetic polyneuropathy Facility Procedures : CPT4 Code: 50277412 Description: 87867 - DEB SUBQ TISSUE 20 SQ CM/< ICD-10 Diagnosis Description 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sed Modifier: Quantity: 1 Physician Procedures : CPT4 Code Description Modifier  [id number] 11042 - WC PHYS SUBQ TISS 20 SQ CM ICD-10 Diagnosis Description 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sed Quantity: 1 Electronic Signature(s) Signed: 10/22/2022 10:04:03 AM By: [name] Keeler PA-C Entered By: [name] on 10/22/2022 10:04:02

## 2022-10-23 NOTE — Progress Notes (Signed)
[name], [name] ([id number]) [id number] Page 1 of 9 Visit Report for 10/22/2022 Arrival Information Details Patient Name: Date of Service: [name], [name] [name] 10/22/2022 9:00 A M Medical Record Number: [id number] Patient Account Number: [id number] Date of Birth/Sex: Treating RN: 07-Feb-1946 (77 y.o. [name] Primary Care Rollie Hynek: [name] Other Clinician: Massie Kluver Referring Shavaun Osterloh: Treating Damascus Feldpausch/Extender: [name] Visit Information History Since Last Visit All ordered tests and consults were completed: No Patient Arrived: [name] Added or deleted any medications: No Arrival Time: 09:02 Any new allergies or adverse reactions: No Transfer Assistance: None Had a fall or experienced change in No Patient Identification Verified: Yes activities of daily living that may affect Secondary Verification Process Completed: Yes risk of falls: Patient Requires Transmission-Based Precautions: No Signs or symptoms of abuse/neglect since last visito No Patient Has Alerts: Yes Hospitalized since last visit: No Patient Alerts: Patient on Blood Thinner Implantable device outside of the clinic excluding No Warfarin cellular tissue based products placed in the center Type II Diabetic since last visit: Has Dressing in Place as Prescribed: Yes Has Compression in Place as Prescribed: Yes Pain Present Now: No Electronic Signature(s) Signed: 10/22/2022 5:24:26 PM By: Massie Kluver Entered By: Massie Kluver on 10/22/2022 09:03:18 -------------------------------------------------------------------------------- Clinic Level of Care Assessment Details Patient Name: Date of Service: [name], [name] [name] 10/22/2022 9:00 A M Medical Record Number: [id number] Patient Account Number: [id number] Date of Birth/Sex: Treating RN: September 16, 1946 (77 y.o. [name] Primary Care Regana Kemple: [name] Other Clinician:  Massie Kluver Referring Mohogany Toppins: Treating Herve Haug/Extender: [name] Clinic Level of Care Assessment Items TOOL 1 Quantity Score []  - 0 Use when EandM and Procedure is performed on INITIAL visit ASSESSMENTS - Nursing Assessment / Reassessment []  - 0 General Physical Exam (combine w/ comprehensive assessment (listed just below) when performed on new pt. [address] FANNIE, ALOMAR ([id number]) [id number] Page 2 of 9 []  - 0 Comprehensive Assessment (HX, ROS, Risk Assessments, Wounds Hx, etc.) ASSESSMENTS - Wound and Skin Assessment / Reassessment []  - 0 Dermatologic / Skin Assessment (not related to wound area) ASSESSMENTS - Ostomy and/or Continence Assessment and Care []  - 0 Incontinence Assessment and Management []  - 0 Ostomy Care Assessment and Management (repouching, etc.) PROCESS - Coordination of Care []  - 0 Simple Patient / Family Education for ongoing care []  - 0 Complex (extensive) Patient / Family Education for ongoing care []  - 0 Staff obtains Programmer, systems, Records, T Results / Process Orders est []  - 0 Staff telephones HHA, Nursing Homes / Clarify orders / etc []  - 0 Routine Transfer to another Facility (non-emergent condition) []  - 0 Routine Hospital Admission (non-emergent condition) []  - 0 New Admissions / Biomedical engineer / Ordering NPWT Apligraf, etc. , []  - 0 Emergency Hospital Admission (emergent condition) PROCESS - Special Needs []  - 0 Pediatric / Minor Patient Management []  - 0 Isolation Patient Management []  - 0 Hearing / Language / Visual special needs []  - 0 Assessment of Community assistance (transportation, D/C planning, etc.) []  - 0 Additional assistance / Altered mentation []  - 0 Support Surface(s) Assessment (bed, cushion, seat, etc.) INTERVENTIONS - Miscellaneous []  - 0 External ear exam []  - 0 Patient Transfer (multiple staff / Civil Service fast streamer / Similar  devices) []  - 0 Simple Staple / Suture removal (25 or less) []  - 0 Complex Staple / Suture removal (26 or more) []  - 0 Hypo/Hyperglycemic Management (do not check if  billed separately) []  - 0 Ankle / Brachial Index (ABI) - do not check if billed separately Has the patient been seen at the hospital within the last three years: Yes Total Score: 0 Level Of Care: ____ Electronic Signature(s) Signed: 10/22/2022 5:24:26 PM By: Massie Kluver Entered By: Massie Kluver on 10/22/2022 09:56:28 -------------------------------------------------------------------------------- Compression Therapy Details Patient Name: Date of Service: [name], [name] [name] 10/22/2022 9:00 A M Medical Record Number: [id number] Patient Account Number: [id number] Date of Birth/Sex: Treating RN: 1945/11/01 (77 y.o. [name] Primary Care Argil Mahl: [name] Other Clinician: Dusty, Raczkowski ([id number]) [id number] Page 3 of 9 Referring Sadarius Norman: Treating Itza Maniaci/Extender: [name] Compression Therapy Performed for Wound Assessment: Wound #10 Left Calcaneus Performed By: Lenice Pressman, Angie, Compression Type: Three Layer Pre Treatment ABI: 1 Post Procedure Diagnosis Same as Pre-procedure Electronic Signature(s) Signed: 10/22/2022 5:24:26 PM By: Massie Kluver Entered By: Massie Kluver on 10/22/2022 09:55:46 -------------------------------------------------------------------------------- Encounter Discharge Information Details Patient Name: Date of Service: [name], [name] [name] 10/22/2022 9:00 A M Medical Record Number: [id number] Patient Account Number: [id number] Date of Birth/Sex: Treating RN: 1946/05/20 (77 y.o. [name] Primary Care Terryn Rosenkranz: [name] Other Clinician: Massie Kluver Referring Arabel Barcenas: Treating Markeesha Char/Extender: [name] Encounter  Discharge Information Items Post Procedure Vitals Discharge Condition: Stable Temperature (F): 98.6 Ambulatory Status: Walker Pulse (bpm): 79 Discharge Destination: Home Respiratory Rate (breaths/min): 18 Transportation: Private Auto Blood Pressure (mmHg): 163/69 Accompanied By: self Schedule Follow-up Appointment: Yes Clinical Summary of Care: Electronic Signature(s) Signed: 10/22/2022 5:24:26 PM By: Massie Kluver Entered By: Massie Kluver on 10/22/2022 10:11:38 -------------------------------------------------------------------------------- Lower Extremity Assessment Details Patient Name: Date of Service: [name], [name] [name] 10/22/2022 9:00 A M Medical Record Number: [id number] Patient Account Number: [id number] Date of Birth/Sex: Treating RN: Mar 14, 1946 (77 y.o. [name] Primary Care Jonaven Hilgers: [name] Other Clinician: Massie Kluver Referring Keldrick Pomplun: Treating Glennis Montenegro/Extender: [name] in Treatment: [address] ([id number]) [phone].pdf Page 4 of 9 Edema Assessment Assessed: [Left: Yes] [Right: No] [Left: Edema] [Right: :] Calf Left: Right: Point of Measurement: 33 cm From Medial Instep 38.5 cm Ankle Left: Right: Point of Measurement: 11 cm From Medial Instep 22.8 cm Vascular Assessment Pulses: Dorsalis Pedis Palpable: [Left:Yes] Electronic Signature(s) Signed: 10/22/2022 5:24:26 PM By: Massie Kluver Signed: 10/23/2022 1:40:16 PM By: Rosalio Loud MSN RN CNS WTA Entered By: Massie Kluver on 10/22/2022 09:18:30 -------------------------------------------------------------------------------- Multi Wound Chart Details Patient Name: Date of Service: [name], [name] [name] 10/22/2022 9:00 A M Medical Record Number: [id number] Patient Account Number: [id number] Date of Birth/Sex: Treating RN: [birth year] (77 y.o. [name] Primary Care Dalayla Aldredge: [name] Other Clinician: Massie Kluver Referring Kamila Broda: Treating Rowin Bayron/Extender: Maurilio Lovely Weeks in Treatment: [name] Vital Signs Height(in): 70 Pulse(bpm): 74 Weight(lbs): 265 Blood Pressure(mmHg): 163/69 Body Mass Index(BMI): 38 Temperature(F): 98.6 Respiratory Rate(breaths/min): 18 [10:Photos:] [N/A:N/A] Left Calcaneus N/A N/A Wound Location: Gradually Appeared N/A N/A Wounding 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 N/A N/A Secondary Etiology: Cataracts, Arrhythmia, Coronary N/A N/A Comorbid History: Artery Disease, Hypertension, Type II Diabetes, Osteoarthritis, Neuropathy DEZMOND, DOWNIE ([id number]) [id number] Page 5 of 9 06/05/2022 N/A N/A Date Acquired: 84 N/A N/A Weeks of Treatment: Open N/A N/A Wound Status: No N/A N/A Wound Recurrence: 1x2.3x0.2 N/A N/A Measurements L x W x D (cm) 1.806 N/A N/A A (cm) : rea 0.361 N/A N/A  Volume (cm) : -27.70% N/A N/A % Reduction in A rea: -27.60% N/A N/A % Reduction in Volume: Grade 1 N/A N/A Classification: Medium N/A N/A Exudate A mount: Serosanguineous N/A N/A Exudate Type: red, brown N/A N/A Exudate Color: Flat and Intact N/A N/A Wound Margin: Small (1-33%) N/A N/A Granulation A mount: Pink N/A N/A Granulation Quality: Large (67-100%) N/A N/A Necrotic A mount: Fat Layer (Subcutaneous Tissue): Yes N/A N/A Exposed Structures: Fascia: No Tendon: No Muscle: No Joint: No Bone: No Small (1-33%) N/A N/A Epithelialization: Treatment Notes Electronic Signature(s) Signed: 10/22/2022 5:24:26 PM By: Massie Kluver Entered By: Massie Kluver on 10/22/2022 09:18:38 -------------------------------------------------------------------------------- Multi-Disciplinary Care Plan Details Patient Name: Date of Service: [name], [name] [name] 10/22/2022 9:00 A M Medical Record Number: [id number] Patient Account Number: [id number] Date of Birth/Sex:  Treating RN: 05/29/1946 (77 y.o. [name] Primary Care Mairead Schwarzkopf: [name] Other Clinician: Massie Kluver Referring Sircharles Holzheimer: Treating Ting Cage/Extender: [name] Active Inactive Pressure Nursing Diagnoses: Knowledge deficit related to causes and risk factors for pressure ulcer development Knowledge deficit related to management of pressures ulcers Potential for impaired tissue integrity related to pressure, friction, moisture, and shear Goals: Patient will remain free from development of additional pressure ulcers Date Initiated: 06/19/2022 Date Inactivated: 07/28/2022 Target Resolution Date: 06/19/2022 Goal Status: Met Patient/caregiver will verbalize risk factors for pressure ulcer development Date Initiated: 06/19/2022 Date Inactivated: 07/28/2022 Target Resolution Date: 06/19/2022 Goal Status: Met Patient/caregiver will verbalize understanding of pressure ulcer management Date Initiated: 06/19/2022 Target Resolution Date: 08/14/2022 Goal Status: Active Interventions: AIMAR, BORGHI ([id number]) [id number] Page 6 of 9 Assess: immobility, friction, shearing, incontinence upon admission and as needed Assess offloading mechanisms upon admission and as needed Assess potential for pressure ulcer upon admission and as needed Provide education on pressure ulcers Notes: Wound/Skin Impairment Nursing Diagnoses: Impaired tissue integrity Goals: Patient/caregiver will verbalize understanding of skin care regimen Date Initiated: 06/19/2022 Date Inactivated: 07/10/2022 Target Resolution Date: 06/19/2022 Goal Status: Met Ulcer/skin breakdown will have a volume reduction of 30% by week 4 Date Initiated: 06/19/2022 Date Inactivated: 07/28/2022 Target Resolution Date: 07/17/2022 Goal Status: Unmet Unmet Reason: infection Ulcer/skin breakdown will have a volume reduction of 50% by week 8 Date Initiated:  07/28/2022 Target Resolution Date: 08/14/2022 Goal Status: Active Interventions: Assess patient/caregiver ability to obtain necessary supplies Assess patient/caregiver ability to perform ulcer/skin care regimen upon admission and as needed Assess ulceration(s) every visit Provide education on ulcer and skin care Treatment Activities: Skin care regimen initiated : 06/19/2022 Topical wound management initiated : 06/19/2022 Notes: Electronic Signature(s) Signed: 10/22/2022 5:24:26 PM By: Massie Kluver Signed: 10/23/2022 1:40:16 PM By: Rosalio Loud MSN RN CNS WTA Entered By: Massie Kluver on 10/22/2022 10:09:53 -------------------------------------------------------------------------------- Pain Assessment Details Patient Name: Date of Service: [name], [name] [name] 10/22/2022 9:00 A M Medical Record Number: [id number] Patient Account Number: [id number] Date of Birth/Sex: Treating RN: [birth year] (77 y.o. [name] Primary Care Nakyra Bourn: [name] Other Clinician: Massie Kluver Referring Newton Frutiger: Treating Tamario Heal/Extender: Maurilio Lovely Weeks in Treatment: [name] Active Problems Location of Pain Severity and Description of Pain Patient Has Paino No Site Locations DAVONTE, SIEBENALER ([id number]) [phone].pdf Page 7 of 9 Pain Management and Medication Current Pain Management: Electronic Signature(s) Signed: 10/22/2022 5:24:26 PM By: Massie Kluver Signed: 10/23/2022 1:40:16 PM By: Rosalio Loud MSN RN CNS WTA Entered By: Massie Kluver on 10/22/2022 09:06:20 -------------------------------------------------------------------------------- Patient/Caregiver Education Details Patient Name: Date of Service: FO RSYTH, [name] [name] 1/4/2024andnbsp9:00 A  M Medical Record Number: [id number] Patient Account Number: [id number] Date of Birth/Gender: Treating RN: [birth year] (77 y.o. [name] Primary Care Physician: [name] Other Clinician:  Massie Kluver Referring Physician: Treating Physician/Extender: [name] Education Assessment Education Provided To: Patient Education Topics Provided Electronic Signature(s) Signed: 10/22/2022 5:24:26 PM By: Massie Kluver Entered By: Massie Kluver on 10/22/2022 10:09:47 [name] ([id number]) [id number] Page 8 of 9 -------------------------------------------------------------------------------- Wound Assessment Details Patient Name: Date of Service: [name], [name] [name] 10/22/2022 9:00 A M Medical Record Number: [id number] Patient Account Number: [id number] Date of Birth/Sex: Treating RN: 06/19/1946 (77 y.o. [name] Primary Care Ece Cumberland: [name] Other Clinician: Massie Kluver Referring Marlise Fahr: Treating Shanielle Correll/Extender: Maurilio Lovely Weeks in Treatment: [name] Wound Status Wound Number: 10 Primary Diabetic Wound/Ulcer of the Lower Extremity Etiology: Wound Location: Left Calcaneus Secondary Pressure Ulcer Wounding Event: Gradually Appeared Etiology: Date Acquired: 06/05/2022 Wound Open Weeks Of Treatment: [name] Status: Clustered Wound: No Comorbid Cataracts, Arrhythmia, Coronary Artery Disease, Hypertension, History: Type II Diabetes, Osteoarthritis, Neuropathy Photos Wound Measurements Length: (cm) 1 Width: (cm) 2.3 Depth: (cm) 0.2 Area: (cm) 1.806 Volume: (cm) 0.361 % Reduction in Area: -27.7% % Reduction in Volume: -27.6% Epithelialization: Small (1-33%) Wound Description Classification: Grade 1 Wound Margin: Flat and Intact Exudate Amount: Medium Exudate Type: Serosanguineous Exudate Color: red, brown Foul Odor After Cleansing: No Slough/Fibrino Yes Wound Bed Granulation Amount: Small (1-33%) Exposed Structure Granulation Quality: Pink Fascia Exposed: No Necrotic Amount: Large (67-100%) Fat Layer (Subcutaneous Tissue) Exposed: Yes Necrotic Quality:  Adherent Slough Tendon Exposed: No Muscle Exposed: No Joint Exposed: No Bone Exposed: No Treatment Notes Wound #10 (Calcaneus) Wound Laterality: Left Cleanser Peri-Wound Care STEPHANE, NIEMANN ([id number]) [id number] Page 9 of 9 Topical Primary Dressing IODOFLEX 0.9% Cadexomer Iodine Pad Discharge Instruction: Apply Iodoflex to wound bed only as directed. Secondary Dressing Zetuvit Plus 4x8 (in/in) Secured With Compression Wrap 3-LAYER WRAP - Profore Lite LF 3 Multilayer Compression Bandaging System Discharge Instruction: Apply 3 multi-layer wrap as prescribed. Compression Stockings Add-Ons Electronic Signature(s) Signed: 10/22/2022 5:24:26 PM By: Massie Kluver Signed: 10/23/2022 1:40:16 PM By: Rosalio Loud MSN RN CNS WTA Entered By: Massie Kluver on 10/22/2022 09:[name]:35 -------------------------------------------------------------------------------- Vitals Details Patient Name: Date of Service: [name], [name] [name] 10/22/2022 9:00 A M Medical Record Number: [id number] Patient Account Number: [id number] Date of Birth/Sex: Treating RN: 1946/08/10 (77 y.o. [name] Primary Care Maudy Yonan: [name] Other Clinician: Massie Kluver Referring Adler Alton: Treating Corinthian Mizrahi/Extender: Maurilio Lovely Weeks in Treatment: [name] Vital Signs Time Taken: 09:03 Temperature (F): 98.6 Height (in): 70 Pulse (bpm): 79 Weight (lbs): 265 Respiratory Rate (breaths/min): 18 Body Mass Index (BMI): 38 Blood Pressure (mmHg): 163/69 Reference Range: 80 - 120 mg / dl Electronic Signature(s) Signed: 10/22/2022 5:24:26 PM By: Massie Kluver Entered By: Massie Kluver on 10/22/2022 09:06:08

## 2022-10-29 ENCOUNTER — Encounter: Payer: Medicare HMO | Admitting: Physician Assistant

## 2022-10-29 DIAGNOSIS — E11621 Type 2 diabetes mellitus with foot ulcer: Secondary | ICD-10-CM | POA: Diagnosis not present

## 2022-10-29 NOTE — Progress Notes (Addendum)
LERON, STOFFERS ([id number]) [id number] Page 1 of 10 Visit Report for 10/29/2022 Chief Complaint Document Details Patient Name: Date of Service: [name], [name] [name] 10/29/2022 1:00 PM Medical Record Number: [id number] Patient Account Number: [id number] Date of Birth/Sex: Treating RN: Sep 13, 1946 (77 y.o. [name] Primary Care Provider: [name] Other Clinician: Betha Loa Referring Provider: Treating Provider/Extender: Hermine Messick Weeks in Treatment: 18 Information Obtained from: Patient Chief Complaint Left heel ulcer Electronic Signature(s) Signed: 10/29/2022 1:43:44 PM By: Lenda Kelp PA-C Entered By: Lenda Kelp on 10/29/2022 13:43:44 -------------------------------------------------------------------------------- HPI Details Patient Name: Date of Service: [name], [name] [name] 10/29/2022 1:00 PM Medical Record Number: [id number] Patient Account Number: [id number] Date of Birth/Sex: Treating RN: 08-[name]-1946 (77 y.o. [name] Primary Care Provider: [name] Other Clinician: Betha Loa Referring Provider: Treating Provider/Extender: Hermine Messick Weeks in Treatment: 18 History of Present Illness HPI Description: 09/24/2020 on evaluation today patient presents today for a heel ulcer that he tells me has been present for about 2 years. He has been seeing podiatry and they have been attempting to manage this including what sounds to be a total contact cast, Unna boot, and just standard dressings otherwise as well. Most recently has been using triple antibiotic ointment. With that being said unfortunately despite everything he really has not had any significant improvement. He tells me that he cannot even really remember exactly how this began but he presumed it may have rubbed on his shoes or something of that nature. 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 He tells me that this does seem to help. Fortunately there is no signs of active infection at this time. His most recent hemoglobin A1c was 8.1 though he knows this was this year he cannot tell me the exact time. His fluid pills currently to help with some of the lower extremity edema although he does obviously have signs of venous stasis/lymphedema. Currently there is no evidence of active infection. No fevers, chills, nausea, vomiting, or diarrhea. Patient has had fairly recent ABIs which were performed on 07/19/2020 and revealed that he has normal findings in both the ankle and toe locations bilaterally. His ABI on the right was 1.09 on the left was 1.08 with a TBI on the right of 0.88 and on the left of 0.94. Triphasic flow was noted throughout. 10/08/2020 on evaluation today patient appears to be doing pretty well in regard to his left heel currently in fact this is doing a great job and seems to be healing quite nicely. Unfortunately on his right leg he had a pile of wood that actually fell on him injuring his right leg this is somewhat erythematous has me concerned little bit about cellulitis though there is not really a good area to culture at this point. [name], [name] ([id number]) [id number] Page 2 of 10 10/24/2020 upon evaluation today patient appears to be doing well with regard to his heel ulcer. He is showing signs of improvement which is great news. His right leg is completely healed. Overall I feel like he is doing excellent and there is no signs of infection. 11/07/2020 upon evaluation today patient appears to be doing well with  regard to his heel ulcer. He tells me that last week when he was unable to come in  his wife actually thought that the wound was very close to closing if not closed. Then it began to "reopen again". I really feel like what may have happened as the collagen may have dried over the wound bed and that because that misunderstanding with thinking that the wound was healing. With that being said I did not see it last week I do not know that for certain. Either way I feel like he is doing great today I see no signs of infection at this point. 11/26/2020 upon inspection today patient appears to be doing decently well in regard to his heel ulcer. He has been tolerating the dressing changes without complication. Fortunately there is no sign of active infection at this time. No fevers, chills, nausea, vomiting, or diarrhea. 12/10/2020 upon evaluation today patient appears to be doing fairly well in regard to the wound on his heel as well as what appears to be a new wound of the left first metatarsal head plantar aspect. This seems to be an area that was callus that has split as the patient tells me has been trying to walk on his toes more has probably where this came from. With that being said there does not appear to be signs of active infection which is great news. 12/17/2020 upon evaluation today patient actually appears to be making good progress currently. Fortunately there is no evidence of active infection at this time. Overall I feel like he is very close to complete closure. 12/26/2020 upon evaluation today patient appears to be doing excellent in regard to his wounds. In fact I am not certain that these are not even completely healed on initial inspection. Overall I am very pleased with where things stand today. Good news is that they are healed he is actually get ready to go out of town and that will be helpful as well as he will be a full part of the time. Readmission: 02/04/2021 upon evaluation today patient appears to be doing well at this point in regard to his left heel that I  previously saw him for. Unfortunately he is having issues with his right lower extremity. He has significant wounds at this point he also has erythema noted there is definite signs of cellulitis which is unfortunate. With that being said I think we do need to address this sooner rather than later. The good news is he did have a nice trip to the beach. He tells me that he had no issues during that time. 4/27; patient on Bactrim. Culture showed methicillin sensitive staph aureus therefore the Bactrim should be effective. 2 small areas on the right leg are healed the area on the mid aspect of the tibia almost 100% covered in a very adherent necrotic debris. We have been using silver alginate 02/20/2021 upon evaluation today patient appears to be doing well with regard to his wound. He is showing signs of improvement which is great news overall very pleased with where things stand today. No fevers, chills, nausea, vomiting, or diarrhea. 02/27/2021 upon evaluation today patient appears to be doing well with regard to his leg ulcer. He is tolerating the dressing changes and overall appears to be doing quite excellent. I am extremely pleased with where things stand and overall I think patient is making great progress. There is no sign of active infection at this time which is also great news. 03/06/2021 upon evaluation  today patient appears to be doing excellent in regard to his wounds. In fact he appears to be completely healed today based on what I am seeing. This is excellent news and overall I am extremely pleased with where he stands. Overall the patient is happy to hear this as well this has been quite sometime coming. Readmission: 04/01/2021 patient unfortunately returns for readmission today. He tells me that he has been wearing his compression socks on the right leg daily and he does not really know what is going on and why his legs are doing what they are doing. We did therefore go ahead and probe  deeper into exactly what has been going on with him today. Subsequently the patient tells me that when he gets up and what we would call "first thing in the morning" are really 2 different things". He does not tend to sleep well so he tells me that he will often wake up at 3:00 in the morning. He will then potentially going into the living room to get in his chair where he may read a book for a little while and then potentially fall asleep back in his chair. He then subsequently wake up around 5:00 or so and then get up and in his words "putter around". This often will end with him proceeding at some point around 8 AM or 9 AM to putting on his compression socks. With that being said this means that anywhere from the 3:00 in the morning till roughly around 9:00 in the morning he has no compression on yet he is sitting in his recliner, walking around and up and about, and this is at least 5 to 6 hours of noncompressed time. That may be our issue here but I did not realize until we discussed this further today. Fortunately there does not appear to be any signs of active infection at this time which is great news. With that being said he has multiple wounds of the bilateral lower extremities. 04/10/2021 upon evaluation today patient appears to be doing well with regard to his legs for the most part. He does look like he had some injury where his wrap slid down nonetheless he did some "doctoring on them". I do feel like most of the areas honestly have cleared back up which is great news. There does not appear to be any signs of active infection at this time which is also great news. No fevers, chills, nausea, vomiting, or diarrhea. 04/18/2021 upon evaluation today patient appears to be doing well with regard to his wounds in fact he appears to be completely healed which is great news. Fortunately there does not appear to be any signs of active infection which is great news. No fevers, chills, nausea, vomiting,  or diarrhea. READMISSION 07/28/2021 This is a now 77 year old man we have had in this clinic for quite a bit of this year. 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 He wears compression stockings. 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 He comes in an old running shoes although he says he wears better shoes at home. His history is that he noticed a blister in the right posterior heel. This open. He has been applying Neosporin to it currently the wound measures 2 x 1.4 cm. 100% slough covered. Not really offloading this in any rigorous way. Past medical history is essentially unchanged he has paroxysmal atrial fibrillation on Coumadin type  2 diabetes with a recent hemoglobin A1c of 7 on metformin. ABI in our clinic was 0.98 on the right 08/05/2021 upon evaluation today patient appears to be doing well with regard to his heel ulcer. Fortunately there does not appear to be any signs of active infection at this time. Overall I been very pleased with where things seem to be currently as far as the wound healing is concerned. Again this is first a lot of seeing him Dr. Leanord Hawking readmitted him last week. Nonetheless I think we are definitely making some progress here. 08/11/2021 upon evaluation today patient's wound on the heel actually showing signs of excellent improvement. I am actually very pleased with where things stand currently. No fevers, chills, nausea, vomiting, or diarrhea. There does not appear to be any need for sharp debridement today either which is also great news. 08/25/2021 upon evaluation today patient appears to be doing well with regard to his heel ulcer. This is actually looking significantly improved compared to last time I saw him. Fortunately there does not appear to be any evidence of active infection at this time. No fevers, chills, nausea, vomiting, or  diarrhea. 09/01/2021 upon evaluation today patient appears to be doing decently well in regard to his wounds. Fortunately there does not appear to be any signs of active infection at this time which is great news and overall very pleased in that regard. I do not see any evidence of active infection systemically which is great news as well. No fevers, chills, nausea, vomiting, or diarrhea. I think that the heel is making excellent progress. FRAN, MCREE ([id number]) [id number] Page 3 of 10 09/15/2021 upon evaluation today patient's heel unfortunately is significantly worse compared to what it was previous. I do believe that at this time he would benefit from switching back to something a little bit more offloading from the cushion shoe he has right now this is more of a heel protector what I really need is a Prevalon offloading boot which I think is good to do a lot better for him than just the small heel protector. 09/23/2021 upon evaluation today patient appears to be doing a little better in regards to last week's visit. Overall I think that he is making good progress which is great news and there does not appear to be any evidence of active infection at this time. No fevers, chills, nausea, vomiting, or diarrhea. 09/30/2021 upon evaluation patient's heel is actually showing signs of good improvement which is great news. Fortunately there does not appear to be any evidence of active infection locally nor systemically at this point. No fevers, chills, nausea, vomiting, or diarrhea. 10/07/2021 upon evaluation today patient appears to be doing well currently in regard to his heel ulcer. He has been tolerating the dressing changes without complication. Fortunately I do not see any signs of active infection at this time which is great news. No fevers, chills, nausea, vomiting, or diarrhea. 12/27; wound is measuring smaller. We have been using silver alginate 10/28/2021 upon  evaluation today patient appears to be doing well currently in regard to his heel ulcer. I am actually very pleased with where things stand and I think he is making excellent progress. Fortunately I do not see any signs of active infection locally nor systemically at this point which is great news. Nonetheless I do believe that the patient would benefit from a new offloading shoe and has been using it Velcro is no longer functioning properly and he tells me that he  almost tripped and fell because of that today. Obviously I do not want him following that would be very bad. 11/11/2021 upon evaluation today patient appears to be doing excellent in regard to his wound. In fact this appears to be completely healed based on what I see currently. I do not see any signs of anything open or draining and I did double check just by clearing away some of the callus around the edges of the wound to ensure that there was nothing still open or hiding underneath. Once this was cleared away it appeared that the patient was doing significantly better at this time which is great news and there was nothing actually open. Readmission: 06-19-2022 upon evaluation today patient appears to be doing well currently in regard to his heel ulcer all things considered. He unfortunately is having a bit of breakdown in general as far as the heel is concerned not nearly as bad as what we noted last time he was here in the clinic. The good news is I do think that this should hopefully heal much more rapidly than what we noted last time. His past medical history has not changed he is on Coumadin. 07-03-2022 upon evaluation today patient appears to be doing better in regard to his heel. We will start to see some improvement here which is good news. Fortunately I do not see any evidence of infection locally or systemically at this time which is great news. No fevers, chills, nausea, vomiting, or diarrhea. 07-10-2022 upon evaluation today  patient appears to be doing well currently in regard to his wound from the callus standpoint around the edges. Unfortunately from an actual wound bed standpoint he has some deep tissue injury noted at this point. Again I am not sure exactly what is going on that is causing this pressure to the region but he is definitely gotten pressure that is occurring and causing this to not heal as effectively as what we would like to see. I discussed with him today that he may need to at night when he sleeping use a pillow up underneath his calf in order to prevent the heel from touching the bed at all. I also think that it may be beneficial for him to monitor throughout his day and make sure there is no other time when he is getting the pressure to the heel. 07-23-2022 upon evaluation today patient appears to be doing poorly currently in regard to his heel ulcer. He has been tolerating the dressing changes without complication. Unfortunately I feel like he may be infected based on what I am seeing. 07-28-2022 I did review patient's culture results which showed positive for Proteus as well as Staphylococcus both of which would be treated with the Bactrim DS that I gave him at the last visit. This is good news and hopefully means that we should be able to apply the cast today as long as the wound itself does not appear to be doing poorly. Patient's wound bed actually showed signs of doing quite well and I am very pleased with where things stand and I do not see any signs of worsening overall which is great news. 08-04-2022 upon evaluation today patient appears to be doing well currently in regard to his heel ulcer. I am actually very pleased with where things stand and I do think this is looking significantly better. With regard to his right shin that is also showing signs of improvement which is great news. 08-10-2022 upon evaluation today patient's wound  on the heel actually appears to be doing significantly  better. In regard to the right anterior shin this is showing signs of healing it might even be completely healed but I still get a monitor I cannot get anything to fill away but also could not see where there was anything actually draining at this point. I am tending to think healed but I want a monitor 1 more week before I call it for sure. 08-17-2022 upon evaluation today patient appears to be doing well currently in regard to his heel. Fortunately he is tolerating the dressing changes without complication. I do not see any signs of infection and overall I think he is making good progress here. We did get approval for the Apligraf but I think he had a $295 co-pay that would have to be paid per application. With that being said as good as he is doing right now I do not think that is necessary but is still an option depending on how things progress if we need to speed this up quite a bit. 08/24/2022; this patient has a wound on his left heel in the setting of type 2 diabetes. We have been using Prisma. He has a heel offloading boot 08-31-2022 upon evaluation today patient appears to be doing poorly in regard to his heel ulcer which is still showing signs of bruising I am just not as happy as I was when I saw him 2 weeks ago with this. He saw Dr. Leanord Hawking last week. Also did not look good at that point apparently. Nonetheless I think that the patient is still continuing to have some counterpressure getting to the wound bed unfortunately. 09-07-2022 upon evaluation today patient appears to be doing well currently in regard to his heel ulcer. He has been tolerating the dressing changes without complication. Fortunately there does not appear to be any signs of active infection locally or systemically at this time. Fortunately I do not see any evidence of active infection at this time. 09-15-2022 upon evaluation today patient appears to be doing well currently in regard to his legs which in fact appear to be  completely healed this is great news. With that being said his heel does have some erythema and warmth as well as drainage I am concerned about cellulitis at this location. 09-21-2022 upon evaluation today patient's wound is actually showing signs of being a little bit smaller but still we are not doing nearly as well as what I would like to see. Fortunately I do not see any signs of infection at this time which is great news and overall I am extremely pleased in that regard. With that being said I do think that he needs to continue to elevate his leg although the edema is under great control with a compression wrap I think switching to a different dressing topically may be beneficial. My suggestion is to switch him over to a Iodoflex dressing. 09-28-2022 upon evaluation today patient appears to be doing well with regard to his heel ulcer. This is actually showing signs of improvement which is great news and overall I am extremely pleased with where we stand today. 10-06-2022 upon evaluation today patient actually appears to be making excellent progress at this point. Fortunately there does not appear to be any signs of active infection locally nor systemically which is great news and overall I am extremely pleased with where we stand. I do feel like he is actually making some pretty good progress here as we switch to  the wrap along with the Iodoflex. 12/26; patient has a wound on the tip of his left heel. This is not a weightbearing surface. He is using a heel offloading boot and carefully offloading this at other times during the day. He is using Iodoflex and Zetuvitunder 3 layer compression 10-22-2022 upon evaluation today patient appears to be doing well currently in regard to his wound. He has been tolerating the dressing changes without complication. Fortunately there does not appear to be any signs of active infection locally nor systemically at this time. No fevers, chills, nausea, vomiting, or  diarrhea. KANAV, KAZMIERCZAK ([id number]) [id number] Page 4 of 10 08-30-2023 upon evaluation today patient appears to be doing well currently in regard to his heel ulcer which is showing signs of good improvement. Fortunately I do not see any evidence of infection locally nor systemically which is great news and overall I am extremely pleased with where we stand. No fevers, chills, nausea, vomiting, or diarrhea. Unfortunately he does have a wound on the side of his fifth toe where I think this did rub on the shoe. Electronic Signature(s) Signed: 10/29/2022 2:31:21 PM By: Lenda Kelp PA-C Entered By: Lenda Kelp on 10/29/2022 14:31:21 -------------------------------------------------------------------------------- Physical Exam Details Patient Name: Date of Service: [name], [name] [name] 10/29/2022 1:00 PM Medical Record Number: [id number] Patient Account Number: [id number] Date of Birth/Sex: Treating RN: Jan 09, 1946 (77 y.o. [name] Primary Care Provider: [name] Other Clinician: Betha Loa Referring Provider: Treating Provider/Extender: Hermine Messick Weeks in Treatment: 18 Constitutional Well-nourished and well-hydrated in no acute distress. Respiratory normal breathing without difficulty. Psychiatric this patient is able to make decisions and demonstrates good insight into disease process. Alert and Oriented x 3. pleasant and cooperative. Notes Upon inspection patient's wound bed actually showed signs of good granulation epithelization at this point. Fortunately I do not see any evidence of infection locally nor systemically which is great news and overall I am extremely pleased with where we stand. No fevers, chills, nausea, vomiting, or diarrhea. Electronic Signature(s) Signed: 10/29/2022 2:31:54 PM By: Lenda Kelp PA-C Entered By: Lenda Kelp on 10/29/2022  14:31:54 -------------------------------------------------------------------------------- Physician Orders Details Patient Name: Date of Service: [name], [name] [name] 10/29/2022 1:00 PM Medical Record Number: [id number] Patient Account Number: [id number] Date of Birth/Sex: Treating RN: 05-04-1946 (77 y.o. 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 Bathing/ Shower/ Hygiene May shower; gently cleanse wound with antibacterial soap, rinse and pat dry prior to dressing wounds Edema Control - Lymphedema / Segmental Compressive Device / Other 3 Layer Compression System for Lymphedema. - left lower leg Non-Wound Condition dditional non-wound orders/instructions: - Apply 40% Urea cream to dry cracked skin on feet. A Off-Loading Open toe surgical shoe with peg assist. Wound Treatment Wound #10 - Calcaneus Wound Laterality: Left Prim Dressing: IODOFLEX 0.9% Cadexomer Iodine Pad 3 x Per Week/30 Days ary Discharge Instructions: Apply Iodoflex to wound bed only as directed. Secondary Dressing: Zetuvit Plus 4x8 (in/in) 3 x Per Week/30 Days Compression Wrap: 3-LAYER WRAP -  Profore Lite LF 3 Multilayer Compression Bandaging System 3 x Per Week/30 Days Discharge Instructions: Apply 3 multi-layer wrap as prescribed. Wound #14 - T Fifth oe Wound Laterality: Left Prim Dressing: Xeroform-HBD 2x2 (in/in) 3 x Per Week/30  Days ary Discharge Instructions: Apply Xeroform-HBD 2x2 (in/in) as directed Secondary Dressing: Coverlet Latex-Free Fabric Adhesive Dressings 3 x Per Week/30 Days Discharge Instructions: 1.5 x 2 Electronic Signature(s) Signed: 10/29/2022 5:05:26 PM By: Lenda Kelp PA-C Signed: 10/30/2022 1:23:45 PM By: Betha Loa Entered By: Betha Loa on 10/29/2022 13:51:56 -------------------------------------------------------------------------------- Problem List Details Patient Name: Date of Service: [name], [name] [name] 10/29/2022 1:00 PM Medical Record Number: [id number] Patient Account Number: [id number] Date of Birth/Sex: Treating RN: 30-Sep-1946 (77 y.o. [name] Primary Care Provider: [name] Other Clinician: Betha Loa Referring Provider: Treating Provider/Extender: [name] in Treatment: 18 Active Problems ICD-10 Encounter Code Description Active Date MDM NIKKOLAS, COOMES ([id number]) [id number] Page 6 of 10 Code Description Active Date MDM Diagnosis E11.621 Type 2 diabetes mellitus with foot ulcer 06/19/2022 No Yes 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sed 06/19/2022 No Yes E11.42 Type 2 diabetes mellitus with diabetic polyneuropathy 06/19/2022 No Yes Inactive Problems Resolved Problems Electronic Signature(s) Signed: 10/29/2022 1:43:40 PM By: Lenda Kelp PA-C Entered By: Lenda Kelp on 10/29/2022 13:43:39 -------------------------------------------------------------------------------- Progress Note Details Patient Name: Date of Service: [name], [name] [name] 10/29/2022 1:00 PM Medical Record Number: [id number] Patient Account Number: [id number] Date of Birth/Sex: Treating RN: 1946/06/23 (77 y.o. [name] Primary Care Provider: [name] Other Clinician: Betha Loa Referring Provider: Treating Provider/Extender: Hermine Messick Weeks in Treatment: 18 Subjective Chief Complaint Information obtained from Patient Left heel ulcer History of Present Illness (HPI) 09/24/2020 on evaluation today patient presents today for a heel ulcer that he tells me has been present for about 2 years. He has been seeing podiatry and they have been attempting to manage this including what sounds to be a total contact cast, Unna boot, and just standard dressings otherwise as well. Most recently has been using triple antibiotic ointment. With that being said unfortunately despite everything he really has not had any significant improvement. He tells me that he cannot even really remember exactly how this began but he presumed it may have rubbed on his shoes or something of that nature. 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 He tells me that this does seem to help. Fortunately there is no signs of active infection at this time. His most recent hemoglobin A1c was 8.1 though he knows this was this year he cannot tell me the exact time. His fluid pills currently to help with some of the lower extremity edema although he does obviously have signs of venous stasis/lymphedema. Currently there is no evidence of active infection. No fevers, chills, nausea, vomiting, or diarrhea. Patient has had fairly recent ABIs which were performed on 07/19/2020 and revealed that he has normal findings in both the ankle and toe locations bilaterally. His ABI on the right was 1.09 on the left was 1.08 with a TBI on the right of 0.88 and on the left of 0.94. Triphasic flow  was noted throughout. 10/08/2020 on evaluation today patient appears to be doing pretty well in regard to his  left heel currently in fact this is doing a great job and seems to be healing quite nicely. Unfortunately on his right leg he had a pile of wood that actually fell on him injuring his right leg this is somewhat erythematous has me concerned little bit about cellulitis though there is not really a good area to culture at this point. 10/24/2020 upon evaluation today patient appears to be doing well with regard to his heel ulcer. He is showing signs of improvement which is great news. His right leg is completely healed. Overall I feel like he is doing excellent and there is no signs of infection. 11/07/2020 upon evaluation today patient appears to be doing well with regard to his heel ulcer. He tells me that last week when he was unable to come in his wife actually thought that the wound was very close to closing if not closed. Then it began to "reopen again". I really feel like what may have happened as the collagen may have dried over the wound bed and that because that misunderstanding with thinking that the wound was healing. With that being said I did not see it last week I do not know that for certain. Either way I feel like he is doing great today I see no signs of infection at this point. 11/26/2020 upon inspection today patient appears to be doing decently well in regard to his heel ulcer. He has been tolerating the dressing changes without complication. Fortunately there is no sign of active infection at this time. No fevers, chills, nausea, vomiting, or diarrhea. 12/10/2020 upon evaluation today patient appears to be doing fairly well in regard to the wound on his heel as well as what appears to be a new wound of the left LA, DIBELLA ([id number]) [phone].pdf Page 7 of 10 first metatarsal head plantar aspect. This seems to be an area that was callus that has split as the patient tells me has been trying to walk on his toes more has probably where this came from. With  that being said there does not appear to be signs of active infection which is great news. 12/17/2020 upon evaluation today patient actually appears to be making good progress currently. Fortunately there is no evidence of active infection at this time. Overall I feel like he is very close to complete closure. 12/26/2020 upon evaluation today patient appears to be doing excellent in regard to his wounds. In fact I am not certain that these are not even completely healed on initial inspection. Overall I am very pleased with where things stand today. Good news is that they are healed he is actually get ready to go out of town and that will be helpful as well as he will be a full part of the time. Readmission: 02/04/2021 upon evaluation today patient appears to be doing well at this point in regard to his left heel that I previously saw him for. Unfortunately he is having issues with his right lower extremity. He has significant wounds at this point he also has erythema noted there is definite signs of cellulitis which is unfortunate. With that being said I think we do need to address this sooner rather than later. The good news is he did have a nice trip to the beach. He tells me that he had no issues during that time. 4/27; patient on Bactrim. Culture showed methicillin  sensitive staph aureus therefore the Bactrim should be effective. 2 small areas on the right leg are healed the area on the mid aspect of the tibia almost 100% covered in a very adherent necrotic debris. We have been using silver alginate 02/20/2021 upon evaluation today patient appears to be doing well with regard to his wound. He is showing signs of improvement which is great news overall very pleased with where things stand today. No fevers, chills, nausea, vomiting, or diarrhea. 02/27/2021 upon evaluation today patient appears to be doing well with regard to his leg ulcer. He is tolerating the dressing changes and overall appears to  be doing quite excellent. I am extremely pleased with where things stand and overall I think patient is making great progress. There is no sign of active infection at this time which is also great news. 03/06/2021 upon evaluation today patient appears to be doing excellent in regard to his wounds. In fact he appears to be completely healed today based on what I am seeing. This is excellent news and overall I am extremely pleased with where he stands. Overall the patient is happy to hear this as well this has been quite sometime coming. Readmission: 04/01/2021 patient unfortunately returns for readmission today. He tells me that he has been wearing his compression socks on the right leg daily and he does not really know what is going on and why his legs are doing what they are doing. We did therefore go ahead and probe deeper into exactly what has been going on with him today. Subsequently the patient tells me that when he gets up and what we would call "first thing in the morning" are really 2 different things". He does not tend to sleep well so he tells me that he will often wake up at 3:00 in the morning. He will then potentially going into the living room to get in his chair where he may read a book for a little while and then potentially fall asleep back in his chair. He then subsequently wake up around 5:00 or so and then get up and in his words "putter around". This often will end with him proceeding at some point around 8 AM or 9 AM to putting on his compression socks. With that being said this means that anywhere from the 3:00 in the morning till roughly around 9:00 in the morning he has no compression on yet he is sitting in his recliner, walking around and up and about, and this is at least 5 to 6 hours of noncompressed time. That may be our issue here but I did not realize until we discussed this further today. Fortunately there does not appear to be any signs of active infection at this time  which is great news. With that being said he has multiple wounds of the bilateral lower extremities. 04/10/2021 upon evaluation today patient appears to be doing well with regard to his legs for the most part. He does look like he had some injury where his wrap slid down nonetheless he did some "doctoring on them". I do feel like most of the areas honestly have cleared back up which is great news. There does not appear to be any signs of active infection at this time which is also great news. No fevers, chills, nausea, vomiting, or diarrhea. 04/18/2021 upon evaluation today patient appears to be doing well with regard to his wounds in fact he appears to be completely healed which is great news. Fortunately there  does not appear to be any signs of active infection which is great news. No fevers, chills, nausea, vomiting, or diarrhea. READMISSION 07/28/2021 This is a now 77 year old man we have had in this clinic for quite a bit of this year. 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 He wears compression stockings. 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 He comes in an old running shoes although he says he wears better shoes at home. His history is that he noticed a blister in the right posterior heel. This open. He has been applying Neosporin to it currently the wound measures 2 x 1.4 cm. 100% slough covered. Not really offloading this in any rigorous way. Past medical history is essentially unchanged he has paroxysmal atrial fibrillation on Coumadin type 2 diabetes with a recent hemoglobin A1c of 7 on metformin. ABI in our clinic was 0.98 on the right 08/05/2021 upon evaluation today patient appears to be doing well with regard to his heel ulcer. Fortunately there does not appear to be any signs of active infection at this time. Overall I been very pleased with where things seem to be  currently as far as the wound healing is concerned. Again this is first a lot of seeing him Dr. Dellia Nims readmitted him last week. Nonetheless I think we are definitely making some progress here. 08/11/2021 upon evaluation today patient's wound on the heel actually showing signs of excellent improvement. I am actually very pleased with where things stand currently. No fevers, chills, nausea, vomiting, or diarrhea. There does not appear to be any need for sharp debridement today either which is also great news. 08/25/2021 upon evaluation today patient appears to be doing well with regard to his heel ulcer. This is actually looking significantly improved compared to last time I saw him. Fortunately there does not appear to be any evidence of active infection at this time. No fevers, chills, nausea, vomiting, or diarrhea. 09/01/2021 upon evaluation today patient appears to be doing decently well in regard to his wounds. Fortunately there does not appear to be any signs of active infection at this time which is great news and overall very pleased in that regard. I do not see any evidence of active infection systemically which is great news as well. No fevers, chills, nausea, vomiting, or diarrhea. I think that the heel is making excellent progress. 09/15/2021 upon evaluation today patient's heel unfortunately is significantly worse compared to what it was previous. I do believe that at this time he would benefit from switching back to something a little bit more offloading from the cushion shoe he has right now this is more of a heel protector what I really need is a Prevalon offloading boot which I think is good to do a lot better for him than just the small heel protector. 09/23/2021 upon evaluation today patient appears to be doing a little better in regards to last week's visit. Overall I think that he is making good progress which is great news and there does not appear to be any evidence of active  infection at this time. No fevers, chills, nausea, vomiting, or diarrhea. 09/30/2021 upon evaluation patient's heel is actually showing signs of good improvement which is great news. Fortunately there does not appear to be any evidence of active infection locally nor systemically at this point. No fevers, chills, nausea, vomiting, or diarrhea. 10/07/2021 upon evaluation today patient appears to be  doing well currently in regard to his heel ulcer. He has been tolerating the dressing changes without JOMO, FORAND ([id number]) [id number] Page 8 of 10 complication. Fortunately I do not see any signs of active infection at this time which is great news. No fevers, chills, nausea, vomiting, or diarrhea. 12/27; wound is measuring smaller. We have been using silver alginate 10/28/2021 upon evaluation today patient appears to be doing well currently in regard to his heel ulcer. I am actually very pleased with where things stand and I think he is making excellent progress. Fortunately I do not see any signs of active infection locally nor systemically at this point which is great news. Nonetheless I do believe that the patient would benefit from a new offloading shoe and has been using it Velcro is no longer functioning properly and he tells me that he almost tripped and fell because of that today. Obviously I do not want him following that would be very bad. 11/11/2021 upon evaluation today patient appears to be doing excellent in regard to his wound. In fact this appears to be completely healed based on what I see currently. I do not see any signs of anything open or draining and I did double check just by clearing away some of the callus around the edges of the wound to ensure that there was nothing still open or hiding underneath. Once this was cleared away it appeared that the patient was doing significantly better at this time which is great news and there was nothing actually  open. Readmission: 06-19-2022 upon evaluation today patient appears to be doing well currently in regard to his heel ulcer all things considered. He unfortunately is having a bit of breakdown in general as far as the heel is concerned not nearly as bad as what we noted last time he was here in the clinic. The good news is I do think that this should hopefully heal much more rapidly than what we noted last time. His past medical history has not changed he is on Coumadin. 07-03-2022 upon evaluation today patient appears to be doing better in regard to his heel. We will start to see some improvement here which is good news. Fortunately I do not see any evidence of infection locally or systemically at this time which is great news. No fevers, chills, nausea, vomiting, or diarrhea. 07-10-2022 upon evaluation today patient appears to be doing well currently in regard to his wound from the callus standpoint around the edges. Unfortunately from an actual wound bed standpoint he has some deep tissue injury noted at this point. Again I am not sure exactly what is going on that is causing this pressure to the region but he is definitely gotten pressure that is occurring and causing this to not heal as effectively as what we would like to see. I discussed with him today that he may need to at night when he sleeping use a pillow up underneath his calf in order to prevent the heel from touching the bed at all. I also think that it may be beneficial for him to monitor throughout his day and make sure there is no other time when he is getting the pressure to the heel. 07-23-2022 upon evaluation today patient appears to be doing poorly currently in regard to his heel ulcer. He has been tolerating the dressing changes without complication. Unfortunately I feel like he may be infected based on what I am seeing. 07-28-2022 I did review patient's culture results which  showed positive for Proteus as well as Staphylococcus both  of which would be treated with the Bactrim DS that I gave him at the last visit. This is good news and hopefully means that we should be able to apply the cast today as long as the wound itself does not appear to be doing poorly. Patient's wound bed actually showed signs of doing quite well and I am very pleased with where things stand and I do not see any signs of worsening overall which is great news. 08-04-2022 upon evaluation today patient appears to be doing well currently in regard to his heel ulcer. I am actually very pleased with where things stand and I do think this is looking significantly better. With regard to his right shin that is also showing signs of improvement which is great news. 08-10-2022 upon evaluation today patient's wound on the heel actually appears to be doing significantly better. In regard to the right anterior shin this is showing signs of healing it might even be completely healed but I still get a monitor I cannot get anything to fill away but also could not see where there was anything actually draining at this point. I am tending to think healed but I want a monitor 1 more week before I call it for sure. 08-17-2022 upon evaluation today patient appears to be doing well currently in regard to his heel. Fortunately he is tolerating the dressing changes without complication. I do not see any signs of infection and overall I think he is making good progress here. We did get approval for the Apligraf but I think he had a $295 co-pay that would have to be paid per application. With that being said as good as he is doing right now I do not think that is necessary but is still an option depending on how things progress if we need to speed this up quite a bit. 08/24/2022; this patient has a wound on his left heel in the setting of type 2 diabetes. We have been using Prisma. He has a heel offloading boot 08-31-2022 upon evaluation today patient appears to be doing poorly in  regard to his heel ulcer which is still showing signs of bruising I am just not as happy as I was when I saw him 2 weeks ago with this. He saw Dr. Leanord Hawking last week. Also did not look good at that point apparently. Nonetheless I think that the patient is still continuing to have some counterpressure getting to the wound bed unfortunately. 09-07-2022 upon evaluation today patient appears to be doing well currently in regard to his heel ulcer. He has been tolerating the dressing changes without complication. Fortunately there does not appear to be any signs of active infection locally or systemically at this time. Fortunately I do not see any evidence of active infection at this time. 09-15-2022 upon evaluation today patient appears to be doing well currently in regard to his legs which in fact appear to be completely healed this is great news. With that being said his heel does have some erythema and warmth as well as drainage I am concerned about cellulitis at this location. 09-21-2022 upon evaluation today patient's wound is actually showing signs of being a little bit smaller but still we are not doing nearly as well as what I would like to see. Fortunately I do not see any signs of infection at this time which is great news and overall I am extremely pleased in that regard. With  that being said I do think that he needs to continue to elevate his leg although the edema is under great control with a compression wrap I think switching to a different dressing topically may be beneficial. My suggestion is to switch him over to a Iodoflex dressing. 09-28-2022 upon evaluation today patient appears to be doing well with regard to his heel ulcer. This is actually showing signs of improvement which is great news and overall I am extremely pleased with where we stand today. 10-06-2022 upon evaluation today patient actually appears to be making excellent progress at this point. Fortunately there does not appear  to be any signs of active infection locally nor systemically which is great news and overall I am extremely pleased with where we stand. I do feel like he is actually making some pretty good progress here as we switch to the wrap along with the Iodoflex. 12/26; patient has a wound on the tip of his left heel. This is not a weightbearing surface. He is using a heel offloading boot and carefully offloading this at other times during the day. He is using Iodoflex and Zetuvitunder 3 layer compression 10-22-2022 upon evaluation today patient appears to be doing well currently in regard to his wound. He has been tolerating the dressing changes without complication. Fortunately there does not appear to be any signs of active infection locally nor systemically at this time. No fevers, chills, nausea, vomiting, or diarrhea. 08-30-2023 upon evaluation today patient appears to be doing well currently in regard to his heel ulcer which is showing signs of good improvement. Fortunately I do not see any evidence of infection locally nor systemically which is great news and overall I am extremely pleased with where we stand. No fevers, chills, nausea, vomiting, or diarrhea. Unfortunately he does have a wound on the side of his fifth toe where I think this did rub on the shoe. AUTHER, LYERLY ([id number]) [id number] Page 9 of 10 Objective Constitutional Well-nourished and well-hydrated in no acute distress. Vitals Time Taken: 1:10 PM, Height: 70 in, Weight: 265 lbs, BMI: 38, Temperature: 98.2 F, Pulse: 71 bpm, Respiratory Rate: 18 breaths/min, Blood Pressure: 147/68 mmHg. Respiratory normal breathing without difficulty. Psychiatric this patient is able to make decisions and demonstrates good insight into disease process. Alert and Oriented x 3. pleasant and cooperative. General Notes: Upon inspection patient's wound bed actually showed signs of good granulation epithelization at  this point. Fortunately I do not see any evidence of infection locally nor systemically which is great news and overall I am extremely pleased with where we stand. No fevers, chills, nausea, vomiting, or diarrhea. Integumentary (Hair, Skin) Wound #10 status is Open. Original cause of wound was Gradually Appeared. The date acquired was: 06/05/2022. The wound has been in treatment 18 weeks. The wound is located on the Left Calcaneus. The wound measures 0.7cm length x 2.2cm width x 0.1cm depth; 1.21cm^2 area and 0.121cm^3 volume. There is Fat Layer (Subcutaneous Tissue) exposed. There is a medium amount of serosanguineous drainage noted. The wound margin is flat and intact. There is small (1-33%) pink granulation within the wound bed. There is a large (67-100%) amount of necrotic tissue within the wound bed including Adherent Slough. Wound #14 status is Open. Original cause of wound was Shear/Friction. The date acquired was: 10/29/2022. The wound is located on the Left T Fifth. The oe wound measures 0.4cm length x 0.7cm width x 0.1cm depth; 0.22cm^2 area and 0.022cm^3 volume. There is fascia exposed. There is  no tunneling or undermining noted. There is a medium amount of sanguinous drainage noted. 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 There was a Three Layer Compression Therapy Procedure with a pre-treatment ABI of 1 by Betha Loa. Post procedure Diagnosis Wound #10: Same as Pre-Procedure Plan Follow-up Appointments: Wound #10 Left Calcaneus: Return Appointment in 1 week. Bathing/ Shower/ Hygiene: May shower; gently cleanse wound with antibacterial soap, rinse and pat dry prior to  dressing wounds Edema Control - Lymphedema / Segmental Compressive Device / Other: 3 Layer Compression System for Lymphedema. - left lower leg Non-Wound Condition: Additional non-wound orders/instructions: - Apply 40% Urea cream to dry cracked skin on feet. Off-Loading: Open toe surgical shoe with peg assist. WOUND #10: - Calcaneus Wound Laterality: Left Prim Dressing: IODOFLEX 0.9% Cadexomer Iodine Pad 3 x Per Week/30 Days ary Discharge Instructions: Apply Iodoflex to wound bed only as directed. Secondary Dressing: Zetuvit Plus 4x8 (in/in) 3 x Per Week/30 Days Com pression Wrap: 3-LAYER WRAP - Profore Lite LF 3 Multilayer Compression Bandaging System 3 x Per Week/30 Days Discharge Instructions: Apply 3 multi-layer wrap as prescribed. WOUND #14: - T Fifth Wound Laterality: Left oe Prim Dressing: Xeroform-HBD 2x2 (in/in) 3 x Per Week/30 Days ary Discharge Instructions: Apply Xeroform-HBD 2x2 (in/in) as directed Secondary Dressing: Coverlet Latex-Free Fabric Adhesive Dressings 3 x Per Week/30 Days Discharge Instructions: 1.5 x 2 MASASHI, SNOWDON ([id number]) [id number] Page 10 of 10 1. I am good recommend currently based on what we are seeing that we go ahead and continue with the Iodoflex for the heel which I think is doing a good job. 2. I am also can recommend that we have the patient continue with the Xeroform gauze and a Band-Aid for the toe ulcer. 3. I would also suggest that the patient should continue to use an offloading shoe but I think more of a postop shoe would be better versus the heel off loader. He is going to see about just pulling out one of the postop shoes he has at home as we previously given this to him and he will give that a try over the next week we will see how things go. He is in agreement with that plan. We will see patient back for reevaluation in 1 week here in the clinic. If anything worsens or changes patient will contact our  office for additional recommendations. Electronic Signature(s) Signed: 10/29/2022 2:33:44 PM By: Lenda Kelp PA-C Entered By: Lenda Kelp on 10/29/2022 14:33:43 -------------------------------------------------------------------------------- SuperBill Details Patient Name: Date of Service: [name], [name] [name] 10/29/2022 Medical Record Number: [id number] Patient Account Number: [id number] Date of Birth/Sex: Treating RN: 1945/10/21 (77 y.o. [name] Primary Care Provider: [name] Other Clinician: Betha Loa Referring Provider: Treating Provider/Extender: Hermine Messick Weeks in Treatment: 18 Diagnosis Coding ICD-10 Codes Code Description E11.621 Type 2 diabetes mellitus with foot ulcer 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sed E11.42 Type 2 diabetes mellitus with diabetic polyneuropathy Facility Procedures : CPT4 Code: [id number] Description: (Facility Use Only) [phone] - APPLY MULTLAY COMPRS LWR LT LEG ICD-10 Diagnosis Description E11.621 Type 2 diabetes mellitus with foot ulcer Modifier: Quantity: 1 Physician Procedures : CPT4 Code Description  Modifier [id number] - WC PHYS LEVEL 3 - EST PT ICD-10 Diagnosis Description E11.621 Type 2 diabetes mellitus with foot ulcer 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sed E11.42 Type 2  diabetes mellitus with diabetic polyneuropathy Quantity: 1 Electronic Signature(s) Signed: 10/29/2022 2:33:57 PM By: Lenda Kelp PA-C Entered By: Lenda Kelp on 10/29/2022 14:33:56

## 2022-10-30 NOTE — Progress Notes (Signed)
EUDELL, MCPHEE ([id number]) [id number] Page 1 of 11 Visit Report for 10/29/2022 Arrival Information Details Patient Name: Date of Service: [name], [name] [name] 10/29/2022 1:00 PM Medical Record Number: [id number] Patient Account Number: [id number] Date of Birth/Sex: Treating RN: Jan 27, 1946 (77 y.o. [name] Primary Care [name]: Fulton Reek Other Clinician: Massie Kluver Referring Neils Siracusa: Treating Anik Wesch/Extender: [name] in Treatment: 18 Visit Information History Since Last Visit All ordered tests and consults were completed: No Patient Arrived: [name] Added or deleted any medications: No Arrival Time: 13:09 Any new allergies or adverse reactions: No Transfer Assistance: None Had a fall or experienced change in No Patient Identification Verified: Yes activities of daily living that may affect Secondary Verification Process Completed: Yes risk of falls: Patient Requires Transmission-Based Precautions: No Signs or symptoms of abuse/neglect since No Patient Has Alerts: Yes last visito Patient Alerts: Patient on Blood Thinner Hospitalized since last visit: No Warfarin Implantable device outside of the clinic No Type II Diabetic excluding cellular tissue based products placed in the center since last visit: Has Dressing in Place as Prescribed: Yes Has Compression in Place as Prescribed: Yes Has Footwear/Offloading in Place as Yes Prescribed: Left: Surgical Shoe with Pressure Relief Insole Pain Present Now: No Electronic Signature(s) Signed: 10/30/2022 1:23:45 PM By: Massie Kluver Entered By: Massie Kluver on 10/29/2022 13:10:23 -------------------------------------------------------------------------------- Clinic Level of Care Assessment Details Patient Name: Date of Service: [name], [name] [facility] J. 10/29/2022 1:00 PM Medical Record Number: [id number] Patient Account Number: [id number] Date of Birth/Sex:  Treating RN: 1946/03/05 (77 y.o. [name] Primary Care Delmos Velaquez: Fulton Reek Other Clinician: Massie Kluver Referring Phyllis Whitefield: Treating Jelina Paulsen/Extender: [name] in Treatment: Becker Assessment Items CRESENCIO, REESOR ([id number]) [phone].pdf Page 2 of 11 TOOL 1 Quantity Score []  - 0 Use when EandM and Procedure is performed on INITIAL visit ASSESSMENTS - Nursing Assessment / Reassessment []  - 0 [name] Physical Exam (combine w/ comprehensive assessment (listed just below) when performed on new pt. evals) []  - 0 Comprehensive Assessment (HX, ROS, Risk Assessments, Wounds Hx, etc.) ASSESSMENTS - Wound and Skin Assessment / Reassessment []  - 0 Dermatologic / Skin Assessment (not related to wound area) ASSESSMENTS - Ostomy and/or Continence Assessment and Care []  - 0 Incontinence Assessment and Management []  - 0 Ostomy Care Assessment and Management (repouching, etc.) PROCESS - Coordination of Care []  - 0 Simple Patient / Family Education for ongoing care []  - 0 Complex (extensive) Patient / Family Education for ongoing care []  - 0 Staff obtains Programmer, systems, Records, T Results / Process Orders est []  - 0 Staff telephones HHA, Nursing Homes / Clarify orders / etc []  - 0 Routine Transfer to another Facility (non-emergent condition) []  - 0 Routine Hospital Admission (non-emergent condition) []  - 0 New Admissions / Biomedical engineer / Ordering NPWT Apligraf, etc. , []  - 0 Emergency Hospital Admission (emergent condition) PROCESS - Special Needs []  - 0 Pediatric / Minor Patient Management []  - 0 Isolation Patient Management []  - 0 Hearing / Language / Visual special needs []  - 0 Assessment of Community assistance (transportation, D/C planning, etc.) []  - 0 Additional assistance / Altered mentation []  - 0 Support Surface(s) Assessment (bed, cushion, seat, etc.) INTERVENTIONS -  Miscellaneous []  - 0 External ear exam []  - 0 Patient Transfer (multiple staff / Civil Service fast streamer / Similar devices) []  - 0 Simple Staple / Suture removal (25 or less) []  - 0 Complex Staple / Suture removal (  26 or more) []  - 0 Hypo/Hyperglycemic Management (do not check if billed separately) []  - 0 Ankle / Brachial Index (ABI) - do not check if billed separately Has the patient been seen at the hospital within the last three years: Yes Total Score: 0 Level Of Care: ____ Electronic Signature(s) Signed: 10/30/2022 1:23:45 PM By: Massie Kluver Entered By: Massie Kluver on 10/29/2022 13:52:15 [name] ([id number]) [id number] Page 3 of 11 -------------------------------------------------------------------------------- Compression Therapy Details Patient Name: Date of Service: [name], [name] [name] 10/29/2022 1:00 PM Medical Record Number: [id number] Patient Account Number: [id number] Date of Birth/Sex: Treating RN: [birth year] (77 y.o. [name] Primary Care Mathilda Maguire: Fulton Reek Other Clinician: Massie Kluver Referring Abimbola Aki: Treating Jabrea Kallstrom/Extender: Maurilio Lovely Weeks in Treatment: 18 Compression Therapy Performed for Wound Assessment: Wound #10 Left Calcaneus Performed By: Lenice Pressman, Angie, Compression Type: Three Layer Pre Treatment ABI: 1 Post Procedure Diagnosis Same as Pre-procedure Electronic Signature(s) Signed: 10/30/2022 1:23:45 PM By: Massie Kluver Entered By: Massie Kluver on 10/29/2022 13:50:29 -------------------------------------------------------------------------------- Encounter Discharge Information Details Patient Name: Date of Service: [name], [name] [name] 10/29/2022 1:00 PM Medical Record Number: [id number] Patient Account Number: [id number] Date of Birth/Sex: Treating RN: 1946/09/29 (77 y.o. [name], [name] Primary Care Katalaya Beel: Fulton Reek Other Clinician: Massie Kluver Referring  Shakemia Madera: Treating Ad Guttman/Extender: [name] in Treatment: 18 Encounter Discharge Information Items Discharge Condition: Stable Ambulatory Status: Walker Discharge Destination: Home Transportation: Private Auto Accompanied By: self Schedule Follow-up Appointment: Yes Clinical Summary of Care: Electronic Signature(s) Signed: 10/30/2022 1:23:45 PM By: Massie Kluver Entered By: Massie Kluver on 10/29/2022 17:09:32 -------------------------------------------------------------------------------- Lower Extremity Assessment Details Patient Name: Date of Service: [name], [name] [name] 10/29/2022 1:00 PM [name] ([id number]) [phone].pdf Page 4 of 11 Medical Record Number: [id number] Patient Account Number: [id number] Date of Birth/Sex: Treating RN: 1945-11-22 (77 y.o. [name] Primary Care Malcome Ambrocio: Fulton Reek Other Clinician: Massie Kluver Referring Janiesha Diehl: Treating Zoya Sprecher/Extender: Maurilio Lovely Weeks in Treatment: 18 Edema Assessment Assessed: [name]: Yes] [name]: No] Edema: [Left: Ye] [Right: s] Calf Left: Right: Point of Measurement: 33 cm From Medial Instep 38.7 cm Ankle Left: Right: Point of Measurement: 11 cm From Medial Instep 22.8 cm Vascular Assessment Pulses: Dorsalis Pedis Palpable: [Left:Yes] Electronic Signature(s) Signed: 10/29/2022 3:17:21 PM By: [name], BSN, RN, CWS, [name] RN, BSN Signed: 10/30/2022 1:23:45 PM By: Massie Kluver Entered By: Massie Kluver on 10/29/2022 13:26:39 -------------------------------------------------------------------------------- Multi Wound Chart Details Patient Name: Date of Service: [name], [name] [name] 10/29/2022 1:00 PM Medical Record Number: [id number] Patient Account Number: [id number] Date of Birth/Sex: Treating RN: Jul 18, 1946 (77 y.o. 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 N/A Primary Etiology: Extremity Pressure Ulcer N/A N/A Secondary Etiology: Cataracts, Arrhythmia, Coronary Cataracts, Arrhythmia, Coronary N/A Comorbid History: Artery Disease, Hypertension, Type II Artery Disease, Hypertension, Type II Diabetes, Osteoarthritis, Neuropathy Diabetes, Osteoarthritis, Neuropathy 06/05/2022 10/29/2022 N/A Date Acquired: 30 0 N/A Weeks of Treatment: Open Open N/A Wound Status: No No N/A Wound Recurrence: 0.7x2.2x0.1 0.4x0.7x0.1 N/A Measurements L x W x D (cm) 1.21 0.22 N/A  A (cm) : rea 0.121 0.022 N/A Volume (cm) : 14.40% N/A N/A % Reduction in A rea: 57.20% N/A N/A % Reduction in Volume: Grade 1 Unstageable/Unclassified N/A Classification: Medium Medium N/A Exudate A mount: Serosanguineous Sanguinous N/A Exudate Type: red, brown red N/A Exudate Color: Flat and Intact N/A N/A Wound Margin: Small (1-33%) N/A N/A Granulation A mount: Pink N/A N/A Granulation Quality: Large (67-100%) N/A N/A Necrotic A mount: Fat Layer (Subcutaneous Tissue): Yes Fascia: Yes N/A Exposed Structures: Fascia: No Fat Layer (Subcutaneous Tissue): No Tendon: No Tendon: No Muscle: No Muscle: No Joint: No Joint: No Bone: No Bone: No Small (1-33%) None N/A Epithelialization: Treatment Notes Electronic Signature(s) Signed: 10/30/2022 1:23:45 PM By: [name] Entered By: [name] on 10/29/2022  13:50:05 -------------------------------------------------------------------------------- Multi-Disciplinary Care Plan Details Patient Name: Date of Service: [name], [name] [name] 10/29/2022 1:00 PM Medical Record Number: [id number] Patient Account Number: [id number] Date of Birth/Sex: Treating RN: August 28, 1946 (77 y.o. [name] Primary Care Yuleidy Rappleye: [name] Other Clinician: Betha [name] Referring Dicy Smigel: Treating Saliou Barnier/Extender: [name] in Treatment: 18 Active Inactive Pressure Nursing Diagnoses: Knowledge deficit related to causes and risk factors for pressure ulcer development Knowledge deficit related to management of pressures ulcers Potential for impaired tissue integrity related to pressure, friction, moisture, and shear Goals: Patient will remain free from development of additional pressure ulcers Date Initiated: 06/19/2022 Date Inactivated: 07/28/2022 Target Resolution Date: 06/19/2022 Goal Status: Met Patient/caregiver will verbalize risk factors for pressure ulcer development Date Initiated: 06/19/2022 Date Inactivated: 07/28/2022 Target Resolution Date: 06/19/2022 Goal Status: Met [name], [name] ([id number]) [id number] Page 6 of 11 Patient/caregiver will verbalize understanding of pressure ulcer management Date Initiated: 06/19/2022 Target Resolution Date: 08/14/2022 Goal Status: Active Interventions: Assess: immobility, friction, shearing, incontinence upon admission and as needed Assess offloading mechanisms upon admission and as needed Assess potential for pressure ulcer upon admission and as needed Provide education on pressure ulcers Notes: Wound/Skin Impairment Nursing Diagnoses: Impaired tissue integrity Goals: Patient/caregiver will verbalize understanding of skin care regimen Date Initiated: 06/19/2022 Date Inactivated: 07/10/2022 Target Resolution Date: 06/19/2022 Goal Status:  Met Ulcer/skin breakdown will have a volume reduction of 30% by week 4 Date Initiated: 06/19/2022 Date Inactivated: 07/28/2022 Target Resolution Date: 07/17/2022 Goal Status: Unmet Unmet Reason: infection Ulcer/skin breakdown will have a volume reduction of 50% by week 8 Date Initiated: 07/28/2022 Target Resolution Date: 08/14/2022 Goal Status: Active Interventions: Assess patient/caregiver ability to obtain necessary supplies Assess patient/caregiver ability to perform ulcer/skin care regimen upon admission and as needed Assess ulceration(s) every visit Provide education on ulcer and skin care Treatment Activities: Skin care regimen initiated : 06/19/2022 Topical wound management initiated : 06/19/2022 Notes: Electronic Signature(s) Signed: 10/29/2022 3:17:21 PM By: Elliot Gurney, BSN, RN, CWS, [name] RN, BSN Signed: 10/30/2022 1:23:45 PM By: [name] Entered By: [name] on 10/29/2022 14:15:46 -------------------------------------------------------------------------------- Pain Assessment Details Patient Name: Date of Service: [name], [name] [name] 10/29/2022 1:00 PM Medical Record Number: [id number] Patient Account Number: [id number] Date of Birth/Sex: Treating RN: December 13, 1945 (77 y.o. [name] Primary Care Kaelah Hayashi: [name] Other Clinician: Betha [name] Referring Maliyah Willets: Treating Demarie Hyneman/Extender: Hermine Messick Weeks in Treatment: 18 Active Problems Location of Pain Severity and Description of Pain Patient Has Paino No Site Locations [name], [name] ([id number]) [phone].pdf Page 7 of 11 Pain Management and Medication Current Pain Management: Electronic Signature(s) Signed: 10/29/2022 3:17:21 PM By: Elliot Gurney, BSN, RN, CWS, [name] RN, BSN Signed: 10/30/2022 1:23:45 PM By: [name] Entered By:  [name] on 10/29/2022  13:13:20 -------------------------------------------------------------------------------- Patient/Caregiver Education Details Patient Name: Date of Service: [name] [facility] 1/11/2024andnbsp1:00 PM Medical Record Number: [id number] Patient Account Number: [id number] Date of Birth/Gender: Treating RN: 1946-05-19 (77 y.o. [name] Primary Care Physician: [name] Other Clinician: Betha [name] Referring Physician: Treating Physician/Extender: [name] in Treatment: 18 Education Assessment Education Provided To: Patient Education Topics Provided Wound/Skin Impairment: Handouts: Other: continue wound care as directed Methods: Explain/Verbal Responses: State content correctly Electronic Signature(s) Signed: 10/30/2022 1:23:45 PM By: [name] Entered By: [name] on 10/29/2022 14:15:41 Barrie Folk ([id number]) [id number] Page 8 of 11 -------------------------------------------------------------------------------- Wound Assessment Details Patient Name: Date of Service: [name], [name] [name] 10/29/2022 1:00 PM Medical Record Number: [id number] Patient Account Number: [id number] Date of Birth/Sex: Treating RN: Jan 02, 1946 (77 y.o. [name], [name] Primary Care Crosley Stejskal: [name] Other Clinician: Betha [name] Referring Jaaziah Schulke: Treating Tatem Holsonback/Extender: Hermine Messick Weeks in Treatment: 18 Wound Status Wound Number: 10 Primary Diabetic Wound/Ulcer of the Lower Extremity Etiology: Wound Location: Left Calcaneus Secondary Pressure Ulcer Wounding Event: Gradually Appeared Etiology: Date Acquired: 06/05/2022 Wound Open Weeks Of Treatment: 18 Status: Clustered Wound: No Comorbid Cataracts, Arrhythmia, Coronary Artery Disease, Hypertension, History: Type II Diabetes, Osteoarthritis, Neuropathy Photos Wound Measurements Length: (cm) 0.7 Width: (cm) 2.2 Depth: (cm) 0.1 Area:  (cm) 1.21 Volume: (cm) 0.121 % Reduction in Area: 14.4% % Reduction in Volume: 57.2% Epithelialization: Small (1-33%) Wound Description Classification: Grade 1 Wound Margin: Flat and Intact Exudate Amount: Medium Exudate Type: Serosanguineous Exudate Color: red, brown Foul Odor After Cleansing: No Slough/Fibrino Yes Wound Bed Granulation Amount: Small (1-33%) Exposed Structure Granulation Quality: Pink Fascia Exposed: No Necrotic Amount: Large (67-100%) Fat Layer (Subcutaneous Tissue) Exposed: Yes Necrotic Quality: Adherent Slough Tendon Exposed: No Muscle Exposed: No Joint Exposed: No Bone Exposed: No Treatment Notes Wound #10 (Calcaneus) Wound Laterality: Left Cleanser Peri-Wound Care [name], STRAWSER ([id number]) [phone].pdf Page 9 of 11 Topical Primary Dressing IODOFLEX 0.9% Cadexomer Iodine Pad Discharge Instruction: Apply Iodoflex to wound bed only as directed. Secondary Dressing Zetuvit Plus 4x8 (in/in) Secured With Compression Wrap 3-LAYER WRAP - Profore Lite LF 3 Multilayer Compression Bandaging System Discharge Instruction: Apply 3 multi-layer wrap as prescribed. Compression Stockings Add-Ons Electronic Signature(s) Signed: 10/29/2022 3:17:21 PM By: Elliot Gurney, BSN, RN, CWS, [name] RN, BSN Signed: 10/30/2022 1:23:45 PM By: [name] Entered By: [name] on 10/29/2022 13:23:25 -------------------------------------------------------------------------------- Wound Assessment Details Patient Name: Date of Service: [name], [name] [name] 10/29/2022 1:00 PM Medical Record Number: [id number] Patient Account Number: [id number] Date of Birth/Sex: Treating RN: [birth year] (77 y.o. [name], [name] Primary Care Aftan Vint: [name] Other Clinician: Betha [name] Referring Ivis Henneman: Treating Lynetta Tomczak/Extender: Hermine Messick Weeks in Treatment: 18 Wound Status Wound Number: 14 Primary Pressure Ulcer Etiology: Wound  Location: Left T Fifth oe Wound Open Wounding Event: Shear/Friction Status: Date Acquired: 10/29/2022 Comorbid Cataracts, Arrhythmia, Coronary Artery Disease, Hypertension, Weeks Of Treatment: 0 History: Type II Diabetes, Osteoarthritis, Neuropathy Clustered Wound: No Photos Wound Measurements Length: (cm) 0.4 Width: (cm) 0.7 Depth: (cm) 0.1 Area: (cm) 0.22 Volume: (cm) 0.022 QUOC, TOME ([id number]) Wound Description Classification: Unstageable/Unclassified Exudate Amount: Medium Exudate Type: Sanguinous Exudate Color: red Foul Odor After Cleansing: No Slough/Fibrino No % Reduction in Area: % Reduction in Volume: Epithelialization: None Tunneling: No Undermining: No [phone].pdf Page 10 of 11 Wound Bed Exposed Structure Fascia Exposed: Yes Fat Layer (Subcutaneous Tissue) Exposed: No Tendon Exposed: No Muscle Exposed: No Joint Exposed:  No Bone Exposed: No Treatment Notes Wound #14 (Toe Fifth) Wound Laterality: Left Cleanser Peri-Wound Care Topical Primary Dressing Xeroform-HBD 2x2 (in/in) Discharge Instruction: Apply Xeroform-HBD 2x2 (in/in) as directed Secondary Dressing Coverlet Latex-Free Fabric Adhesive Dressings Discharge Instruction: 1.5 x 2 Secured With Compression Wrap Compression Stockings Add-Ons Electronic Signature(s) Signed: 10/29/2022 3:17:21 PM By: Elliot Gurney, BSN, RN, CWS, [name] RN, BSN Signed: 10/30/2022 1:23:45 PM By: [name] Entered By: [name] on 10/29/2022 13:25:44 -------------------------------------------------------------------------------- Vitals Details Patient Name: Date of Service: [name], [name] [name] 10/29/2022 1:00 PM Medical Record Number: [id number] Patient Account Number: [id number] Date of Birth/Sex: Treating RN: 1946-09-09 (77 y.o. [name], [name] Primary Care Kenika Sahm: [name] Other Clinician: Betha [name] Referring Karole Oo: Treating Kalista Laguardia/Extender: Hermine Messick Weeks in Treatment: 18 Vital Signs Time Taken: 13:10 Temperature (F): 98.2 Height (in): 70 Pulse (bpm): 71 Weight (lbs): 265 Respiratory Rate (breaths/min): 18 Body Mass Index (BMI): 38 Blood Pressure (mmHg): 147/68 Reference Range: 80 - 120 mg / dl CAGE, GUPTON ([id number]) [phone].pdf Page 11 of 11 Electronic Signature(s) Signed: 10/30/2022 1:23:45 PM By: [name] Entered By: [name] on 10/29/2022 13:13:15

## 2022-11-05 ENCOUNTER — Encounter: Payer: Medicare HMO | Admitting: Physician Assistant

## 2022-11-05 DIAGNOSIS — E11621 Type 2 diabetes mellitus with foot ulcer: Secondary | ICD-10-CM | POA: Diagnosis not present

## 2022-11-05 NOTE — Progress Notes (Addendum)
[name], [name] ([id number]) [id number] Page 1 of 10 Visit Report for 11/05/2022 Chief Complaint Document Details Patient Name: Date of Service: [name], [name] [name] 11/05/2022 2:00 PM Medical Record Number: [id number] Patient Account Number: [id number] Date of Birth/Sex: Treating RN: Dec 14, 1945 (77 y.o. [name] Primary Care Provider: [name] Other Clinician: Betha Loa Referring Provider: Treating Provider/Extender: Hermine Messick Weeks in Treatment: 19 Information Obtained from: Patient Chief Complaint Left heel ulcer Electronic Signature(s) Signed: 11/05/2022 2:13:59 PM By: Lenda Kelp PA-C Entered By: Lenda Kelp on 11/05/2022 14:13:58 -------------------------------------------------------------------------------- HPI Details Patient Name: Date of Service: [name], [name] [name] 11/05/2022 2:00 PM Medical Record Number: [id number] Patient Account Number: [id number] Date of Birth/Sex: Treating RN: [birth year] (77 y.o. [name] Primary Care Provider: [name] Other Clinician: Betha Loa Referring Provider: Treating Provider/Extender: Hermine Messick Weeks in Treatment: 19 History of Present Illness HPI Description: 09/24/2020 on evaluation today patient presents today for a heel ulcer that he tells me has been present for about 2 years. He has been seeing podiatry and they have been attempting to manage this including what sounds to be a total contact cast, Unna boot, and just standard dressings otherwise as well. Most recently has been using triple antibiotic ointment. With that being said unfortunately despite everything he really has not had any significant improvement. He tells me that he cannot even really remember exactly how this began but he presumed it may have rubbed on his shoes or something of that nature. 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 He tells me that this does seem to help. Fortunately there is no signs of active infection at this time. His most recent hemoglobin A1c was 8.1 though he knows this was this year he cannot tell me the exact time. His fluid pills currently to help with some of the lower extremity edema although he does obviously have signs of venous stasis/lymphedema. Currently there is no evidence of active infection. No fevers, chills, nausea, vomiting, or diarrhea. Patient has had fairly recent ABIs which were performed on 07/19/2020 and revealed that he has normal findings in both the ankle and toe locations bilaterally. His ABI on the right was 1.09 on the left was 1.08 with a TBI on the right of 0.88 and on the left of 0.94. Triphasic flow was noted throughout. 10/08/2020 on evaluation today patient appears to be doing pretty well in regard to his left heel currently in fact this is doing a great job and seems to be healing quite nicely. Unfortunately on his right leg he had a pile of wood that actually fell on him injuring his right leg this is somewhat erythematous has me concerned little bit about cellulitis though there is not really a good area to culture at this point. [name], [name] ([id number]) [id number] Page 2 of 10 10/24/2020 upon evaluation today patient appears to be doing well with regard to his heel ulcer. He is showing signs of improvement which is great news. His right leg is completely healed. Overall I feel like he is doing excellent and there is no signs of infection. 11/07/2020 upon evaluation today patient appears to be doing well with  regard to his heel ulcer. He tells me that last week when he was unable to come in  his wife actually thought that the wound was very close to closing if not closed. Then it began to "reopen again". I really feel like what may have happened as the collagen may have dried over the wound bed and that because that misunderstanding with thinking that the wound was healing. With that being said I did not see it last week I do not know that for certain. Either way I feel like he is doing great today I see no signs of infection at this point. 11/26/2020 upon inspection today patient appears to be doing decently well in regard to his heel ulcer. He has been tolerating the dressing changes without complication. Fortunately there is no sign of active infection at this time. No fevers, chills, nausea, vomiting, or diarrhea. 12/10/2020 upon evaluation today patient appears to be doing fairly well in regard to the wound on his heel as well as what appears to be a new wound of the left first metatarsal head plantar aspect. This seems to be an area that was callus that has split as the patient tells me has been trying to walk on his toes more has probably where this came from. With that being said there does not appear to be signs of active infection which is great news. 12/17/2020 upon evaluation today patient actually appears to be making good progress currently. Fortunately there is no evidence of active infection at this time. Overall I feel like he is very close to complete closure. 12/26/2020 upon evaluation today patient appears to be doing excellent in regard to his wounds. In fact I am not certain that these are not even completely healed on initial inspection. Overall I am very pleased with where things stand today. Good news is that they are healed he is actually get ready to go out of town and that will be helpful as well as he will be a full part of the time. Readmission: 02/04/2021 upon evaluation today patient appears to be doing well at this point in regard to his left heel that I  previously saw him for. Unfortunately he is having issues with his right lower extremity. He has significant wounds at this point he also has erythema noted there is definite signs of cellulitis which is unfortunate. With that being said I think we do need to address this sooner rather than later. The good news is he did have a nice trip to the beach. He tells me that he had no issues during that time. 4/27; patient on Bactrim. Culture showed methicillin sensitive staph aureus therefore the Bactrim should be effective. 2 small areas on the right leg are healed the area on the mid aspect of the tibia almost 100% covered in a very adherent necrotic debris. We have been using silver alginate 02/20/2021 upon evaluation today patient appears to be doing well with regard to his wound. He is showing signs of improvement which is great news overall very pleased with where things stand today. No fevers, chills, nausea, vomiting, or diarrhea. 02/27/2021 upon evaluation today patient appears to be doing well with regard to his leg ulcer. He is tolerating the dressing changes and overall appears to be doing quite excellent. I am extremely pleased with where things stand and overall I think patient is making great progress. There is no sign of active infection at this time which is also great news. 03/06/2021 upon evaluation  today patient appears to be doing excellent in regard to his wounds. In fact he appears to be completely healed today based on what I am seeing. This is excellent news and overall I am extremely pleased with where he stands. Overall the patient is happy to hear this as well this has been quite sometime coming. Readmission: 04/01/2021 patient unfortunately returns for readmission today. He tells me that he has been wearing his compression socks on the right leg daily and he does not really know what is going on and why his legs are doing what they are doing. We did therefore go ahead and probe  deeper into exactly what has been going on with him today. Subsequently the patient tells me that when he gets up and what we would call "first thing in the morning" are really 2 different things". He does not tend to sleep well so he tells me that he will often wake up at 3:00 in the morning. He will then potentially going into the living room to get in his chair where he may read a book for a little while and then potentially fall asleep back in his chair. He then subsequently wake up around 5:00 or so and then get up and in his words "putter around". This often will end with him proceeding at some point around 8 AM or 9 AM to putting on his compression socks. With that being said this means that anywhere from the 3:00 in the morning till roughly around 9:00 in the morning he has no compression on yet he is sitting in his recliner, walking around and up and about, and this is at least 5 to 6 hours of noncompressed time. That may be our issue here but I did not realize until we discussed this further today. Fortunately there does not appear to be any signs of active infection at this time which is great news. With that being said he has multiple wounds of the bilateral lower extremities. 04/10/2021 upon evaluation today patient appears to be doing well with regard to his legs for the most part. He does look like he had some injury where his wrap slid down nonetheless he did some "doctoring on them". I do feel like most of the areas honestly have cleared back up which is great news. There does not appear to be any signs of active infection at this time which is also great news. No fevers, chills, nausea, vomiting, or diarrhea. 04/18/2021 upon evaluation today patient appears to be doing well with regard to his wounds in fact he appears to be completely healed which is great news. Fortunately there does not appear to be any signs of active infection which is great news. No fevers, chills, nausea, vomiting,  or diarrhea. READMISSION 07/28/2021 This is a now 77 year old man we have had in this clinic for quite a bit of this year. 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 He wears compression stockings. 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 He comes in an old running shoes although he says he wears better shoes at home. His history is that he noticed a blister in the right posterior heel. This open. He has been applying Neosporin to it currently the wound measures 2 x 1.4 cm. 100% slough covered. Not really offloading this in any rigorous way. Past medical history is essentially unchanged he has paroxysmal atrial fibrillation on Coumadin type  2 diabetes with a recent hemoglobin A1c of 7 on metformin. ABI in our clinic was 0.98 on the right 08/05/2021 upon evaluation today patient appears to be doing well with regard to his heel ulcer. Fortunately there does not appear to be any signs of active infection at this time. Overall I been very pleased with where things seem to be currently as far as the wound healing is concerned. Again this is first a lot of seeing him Dr. Leanord Hawking readmitted him last week. Nonetheless I think we are definitely making some progress here. 08/11/2021 upon evaluation today patient's wound on the heel actually showing signs of excellent improvement. I am actually very pleased with where things stand currently. No fevers, chills, nausea, vomiting, or diarrhea. There does not appear to be any need for sharp debridement today either which is also great news. 08/25/2021 upon evaluation today patient appears to be doing well with regard to his heel ulcer. This is actually looking significantly improved compared to last time I saw him. Fortunately there does not appear to be any evidence of active infection at this time. No fevers, chills, nausea, vomiting, or  diarrhea. 09/01/2021 upon evaluation today patient appears to be doing decently well in regard to his wounds. Fortunately there does not appear to be any signs of active infection at this time which is great news and overall very pleased in that regard. I do not see any evidence of active infection systemically which is great news as well. No fevers, chills, nausea, vomiting, or diarrhea. I think that the heel is making excellent progress. [name], [name] ([id number]) [id number] Page 3 of 10 09/15/2021 upon evaluation today patient's heel unfortunately is significantly worse compared to what it was previous. I do believe that at this time he would benefit from switching back to something a little bit more offloading from the cushion shoe he has right now this is more of a heel protector what I really need is a Prevalon offloading boot which I think is good to do a lot better for him than just the small heel protector. 09/23/2021 upon evaluation today patient appears to be doing a little better in regards to last week's visit. Overall I think that he is making good progress which is great news and there does not appear to be any evidence of active infection at this time. No fevers, chills, nausea, vomiting, or diarrhea. 09/30/2021 upon evaluation patient's heel is actually showing signs of good improvement which is great news. Fortunately there does not appear to be any evidence of active infection locally nor systemically at this point. No fevers, chills, nausea, vomiting, or diarrhea. 10/07/2021 upon evaluation today patient appears to be doing well currently in regard to his heel ulcer. He has been tolerating the dressing changes without complication. Fortunately I do not see any signs of active infection at this time which is great news. No fevers, chills, nausea, vomiting, or diarrhea. 12/27; wound is measuring smaller. We have been using silver alginate 10/28/2021 upon  evaluation today patient appears to be doing well currently in regard to his heel ulcer. I am actually very pleased with where things stand and I think he is making excellent progress. Fortunately I do not see any signs of active infection locally nor systemically at this point which is great news. Nonetheless I do believe that the patient would benefit from a new offloading shoe and has been using it Velcro is no longer functioning properly and he tells me that he  almost tripped and fell because of that today. Obviously I do not want him following that would be very bad. 11/11/2021 upon evaluation today patient appears to be doing excellent in regard to his wound. In fact this appears to be completely healed based on what I see currently. I do not see any signs of anything open or draining and I did double check just by clearing away some of the callus around the edges of the wound to ensure that there was nothing still open or hiding underneath. Once this was cleared away it appeared that the patient was doing significantly better at this time which is great news and there was nothing actually open. Readmission: 06-19-2022 upon evaluation today patient appears to be doing well currently in regard to his heel ulcer all things considered. He unfortunately is having a bit of breakdown in general as far as the heel is concerned not nearly as bad as what we noted last time he was here in the clinic. The good news is I do think that this should hopefully heal much more rapidly than what we noted last time. His past medical history has not changed he is on Coumadin. 07-03-2022 upon evaluation today patient appears to be doing better in regard to his heel. We will start to see some improvement here which is good news. Fortunately I do not see any evidence of infection locally or systemically at this time which is great news. No fevers, chills, nausea, vomiting, or diarrhea. 07-10-2022 upon evaluation today  patient appears to be doing well currently in regard to his wound from the callus standpoint around the edges. Unfortunately from an actual wound bed standpoint he has some deep tissue injury noted at this point. Again I am not sure exactly what is going on that is causing this pressure to the region but he is definitely gotten pressure that is occurring and causing this to not heal as effectively as what we would like to see. I discussed with him today that he may need to at night when he sleeping use a pillow up underneath his calf in order to prevent the heel from touching the bed at all. I also think that it may be beneficial for him to monitor throughout his day and make sure there is no other time when he is getting the pressure to the heel. 07-23-2022 upon evaluation today patient appears to be doing poorly currently in regard to his heel ulcer. He has been tolerating the dressing changes without complication. Unfortunately I feel like he may be infected based on what I am seeing. 07-28-2022 I did review patient's culture results which showed positive for Proteus as well as Staphylococcus both of which would be treated with the Bactrim DS that I gave him at the last visit. This is good news and hopefully means that we should be able to apply the cast today as long as the wound itself does not appear to be doing poorly. Patient's wound bed actually showed signs of doing quite well and I am very pleased with where things stand and I do not see any signs of worsening overall which is great news. 08-04-2022 upon evaluation today patient appears to be doing well currently in regard to his heel ulcer. I am actually very pleased with where things stand and I do think this is looking significantly better. With regard to his right shin that is also showing signs of improvement which is great news. 08-10-2022 upon evaluation today patient's wound  on the heel actually appears to be doing significantly  better. In regard to the right anterior shin this is showing signs of healing it might even be completely healed but I still get a monitor I cannot get anything to fill away but also could not see where there was anything actually draining at this point. I am tending to think healed but I want a monitor 1 more week before I call it for sure. 08-17-2022 upon evaluation today patient appears to be doing well currently in regard to his heel. Fortunately he is tolerating the dressing changes without complication. I do not see any signs of infection and overall I think he is making good progress here. We did get approval for the Apligraf but I think he had a $295 co-pay that would have to be paid per application. With that being said as good as he is doing right now I do not think that is necessary but is still an option depending on how things progress if we need to speed this up quite a bit. 08/24/2022; this patient has a wound on his left heel in the setting of type 2 diabetes. We have been using Prisma. He has a heel offloading boot 08-31-2022 upon evaluation today patient appears to be doing poorly in regard to his heel ulcer which is still showing signs of bruising I am just not as happy as I was when I saw him 2 weeks ago with this. He saw Dr. Leanord Hawking last week. Also did not look good at that point apparently. Nonetheless I think that the patient is still continuing to have some counterpressure getting to the wound bed unfortunately. 09-07-2022 upon evaluation today patient appears to be doing well currently in regard to his heel ulcer. He has been tolerating the dressing changes without complication. Fortunately there does not appear to be any signs of active infection locally or systemically at this time. Fortunately I do not see any evidence of active infection at this time. 09-15-2022 upon evaluation today patient appears to be doing well currently in regard to his legs which in fact appear to be  completely healed this is great news. With that being said his heel does have some erythema and warmth as well as drainage I am concerned about cellulitis at this location. 09-21-2022 upon evaluation today patient's wound is actually showing signs of being a little bit smaller but still we are not doing nearly as well as what I would like to see. Fortunately I do not see any signs of infection at this time which is great news and overall I am extremely pleased in that regard. With that being said I do think that he needs to continue to elevate his leg although the edema is under great control with a compression wrap I think switching to a different dressing topically may be beneficial. My suggestion is to switch him over to a Iodoflex dressing. 09-28-2022 upon evaluation today patient appears to be doing well with regard to his heel ulcer. This is actually showing signs of improvement which is great news and overall I am extremely pleased with where we stand today. 10-06-2022 upon evaluation today patient actually appears to be making excellent progress at this point. Fortunately there does not appear to be any signs of active infection locally nor systemically which is great news and overall I am extremely pleased with where we stand. I do feel like he is actually making some pretty good progress here as we switch to  the wrap along with the Iodoflex. 12/26; patient has a wound on the tip of his left heel. This is not a weightbearing surface. He is using a heel offloading boot and carefully offloading this at other times during the day. He is using Iodoflex and Zetuvitunder 3 layer compression 10-22-2022 upon evaluation today patient appears to be doing well currently in regard to his wound. He has been tolerating the dressing changes without complication. Fortunately there does not appear to be any signs of active infection locally nor systemically at this time. No fevers, chills, nausea, vomiting, or  diarrhea. [name], [name] ([id number]) [id number] Page 4 of 10 08-30-2023 upon evaluation today patient appears to be doing well currently in regard to his heel ulcer which is showing signs of good improvement. Fortunately I do not see any evidence of infection locally nor systemically which is great news and overall I am extremely pleased with where we stand. No fevers, chills, nausea, vomiting, or diarrhea. Unfortunately he does have a wound on the side of his fifth toe where I think this did rub on the shoe. 11-05-2022 upon evaluation today patient still still showing some signs of pressure at this point and there may have even been some fluid collection at this time. Fortunately I do not see any evidence of active infection locally nor systemically which is great news and overall I am extremely pleased with where we stand. No fevers, chills, nausea, vomiting, or diarrhea. Electronic Signature(s) Signed: 11/05/2022 5:34:20 PM By: Lenda Kelp PA-C Entered By: Lenda Kelp on 11/05/2022 17:34:20 -------------------------------------------------------------------------------- Physical Exam Details Patient Name: Date of Service: [name], [name] [name] 11/05/2022 2:00 PM Medical Record Number: [id number] Patient Account Number: [id number] Date of Birth/Sex: Treating RN: Apr 07, 1946 (77 y.o. [name] Primary Care Provider: [name] Other Clinician: Betha Loa Referring Provider: Treating Provider/Extender: Hermine Messick Weeks in Treatment: 67 Constitutional Well-nourished and well-hydrated in no acute distress. Respiratory normal breathing without difficulty. Psychiatric this patient is able to make decisions and demonstrates good insight into disease process. Alert and Oriented x 3. pleasant and cooperative. Notes Upon inspection patient's wound bed actually did not require sharp debridement today which was good news. Fortunately  I do not see any evidence of infection locally nor systemically which is great news as well and in general I think that we are on the right track. I do believe that he is going to require continuation of the compression wrapping which seems to be doing extremely well for him. I am going to avoid any gauze applied over top of the Iodoflex which is directly applied to the Zetuvit. Hopefully this will be better as far as making sure nothing rubs or clicks are trapped in the fluid. Electronic Signature(s) Signed: 11/05/2022 5:35:01 PM By: Lenda Kelp PA-C Entered By: Lenda Kelp on 11/05/2022 17:35:01 -------------------------------------------------------------------------------- Physician Orders Details Patient Name: Date of Service: [name], [name] [name] 11/05/2022 2:00 PM Medical Record Number: [id number] Patient Account Number: [id number] Date of Birth/Sex: Treating RN: November 15, 1945 (77 y.o. [name] Primary Care Provider: [name] Other Clinician: Betha Loa Referring Provider: Treating Provider/Extender: [name] in Treatment: [address] 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 Bathing/ Shower/ Hygiene May shower; gently cleanse wound with antibacterial soap, rinse and pat dry prior to dressing wounds Edema Control - Lymphedema / Segmental Compressive Device / Other 3 Layer Compression System for Lymphedema. - left lower leg Non-Wound Condition dditional non-wound orders/instructions: - Apply 40% Urea cream to dry cracked skin on feet. A Off-Loading Open toe  surgical shoe with peg assist. Wound Treatment Wound #10 - Calcaneus Wound Laterality: Left Prim Dressing: IODOFLEX 0.9% Cadexomer Iodine Pad 3 x Per Week/30 Days ary Discharge Instructions: Apply Iodoflex to wound bed only as directed. Secondary Dressing: Zetuvit Plus 4x8 (in/in) 3 x Per Week/30 Days Compression Wrap: 3-LAYER WRAP - Profore Lite LF 3 Multilayer Compression Bandaging System 3 x Per Week/30 Days Discharge Instructions: Apply 3 multi-layer wrap as prescribed. Wound #14 - T Fifth oe Wound Laterality: Left Prim Dressing: Xeroform-HBD 2x2 (in/in) 3 x Per Week/30 Days ary Discharge Instructions: Apply Xeroform-HBD 2x2 (in/in) as directed Secondary Dressing: Coverlet Latex-Free Fabric Adhesive Dressings 3 x Per Week/30 Days Discharge Instructions: 1.5 x 2 Electronic Signature(s) Signed: 11/05/2022 5:43:57 PM By: Lenda Kelp PA-C Signed: 11/06/2022 9:50:53 AM By: Betha Loa Entered By: Betha Loa on 11/05/2022 15:14:47 -------------------------------------------------------------------------------- Problem List Details Patient Name: Date of Service: [name], [name] [name] 11/05/2022 2:00 PM Medical Record Number: [id number] Patient Account Number: [id number] Date of Birth/Sex: Treating RN: 1946/09/07 (77 y.o. [name] Primary Care Provider: [name] Other Clinician: Betha Loa Referring Provider: Treating Provider/Extender: [name] in Treatment: [address] ([id number]) [phone].pdf Page 6 of 10 Active Problems ICD-10 Encounter Code Description Active Date MDM Diagnosis E11.621 Type 2 diabetes mellitus with foot ulcer 06/19/2022 No Yes 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sed 06/19/2022 No Yes E11.42 Type 2 diabetes mellitus with diabetic polyneuropathy 06/19/2022 No Yes Inactive Problems Resolved Problems Electronic Signature(s) Signed:  11/05/2022 2:13:55 PM By: Lenda Kelp PA-C Entered By: Lenda Kelp on 11/05/2022 14:13:54 -------------------------------------------------------------------------------- Progress Note Details Patient Name: Date of Service: [name], [name] [name] 11/05/2022 2:00 PM Medical Record Number: [id number] Patient Account Number: [id number] Date of Birth/Sex: Treating RN: 1946/06/03 (77 y.o. [name] Primary Care Provider: [name] Other Clinician: Betha Loa Referring Provider: Treating Provider/Extender: Hermine Messick Weeks in Treatment: 19 Subjective Chief Complaint Information obtained from Patient Left heel ulcer History of Present Illness (HPI) 09/24/2020 on evaluation today patient presents today for a heel ulcer that he tells me has been present for about 2 years. He has been seeing podiatry and they have been attempting to manage this including what sounds to be a total contact cast, Unna boot, and just standard dressings otherwise as well. Most recently has been using triple antibiotic ointment. With that being said unfortunately despite everything he really has not had any significant improvement. He tells me that he cannot even really remember exactly how this began but he presumed it may have rubbed on his shoes or something of that nature. 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 He tells me that this does seem to help. Fortunately there is no signs of active  infection at this time. His most recent hemoglobin A1c was 8.1 though he knows this was this year he cannot tell me the exact time. His fluid pills currently to help with some of the lower extremity edema although he  does obviously have signs of venous stasis/lymphedema. Currently there is no evidence of active infection. No fevers, chills, nausea, vomiting, or diarrhea. Patient has had fairly recent ABIs which were performed on 07/19/2020 and revealed that he has normal findings in both the ankle and toe locations bilaterally. His ABI on the right was 1.09 on the left was 1.08 with a TBI on the right of 0.88 and on the left of 0.94. Triphasic flow was noted throughout. 10/08/2020 on evaluation today patient appears to be doing pretty well in regard to his left heel currently in fact this is doing a great job and seems to be healing quite nicely. Unfortunately on his right leg he had a pile of wood that actually fell on him injuring his right leg this is somewhat erythematous has me concerned little bit about cellulitis though there is not really a good area to culture at this point. 10/24/2020 upon evaluation today patient appears to be doing well with regard to his heel ulcer. He is showing signs of improvement which is great news. His right leg is completely healed. Overall I feel like he is doing excellent and there is no signs of infection. 11/07/2020 upon evaluation today patient appears to be doing well with regard to his heel ulcer. He tells me that last week when he was unable to come in his wife actually thought that the wound was very close to closing if not closed. Then it began to "reopen again". I really feel like what may have happened as the collagen may have dried over the wound bed and that because that misunderstanding with thinking that the wound was healing. With that being said I did not [name], [name] ([id number]) [id number] Page 7 of 10 see it last week I do not know that for certain. Either way I feel like he is doing great today I see no signs of infection at this point. 11/26/2020 upon inspection today patient appears to be doing decently well in regard to his  heel ulcer. He has been tolerating the dressing changes without complication. Fortunately there is no sign of active infection at this time. No fevers, chills, nausea, vomiting, or diarrhea. 12/10/2020 upon evaluation today patient appears to be doing fairly well in regard to the wound on his heel as well as what appears to be a new wound of the left first metatarsal head plantar aspect. This seems to be an area that was callus that has split as the patient tells me has been trying to walk on his toes more has probably where this came from. With that being said there does not appear to be signs of active infection which is great news. 12/17/2020 upon evaluation today patient actually appears to be making good progress currently. Fortunately there is no evidence of active infection at this time. Overall I feel like he is very close to complete closure. 12/26/2020 upon evaluation today patient appears to be doing excellent in regard to his wounds. In fact I am not certain that these are not even completely healed on initial inspection. Overall I am very pleased with where things stand today. Good news is that they are healed he is actually get ready to go out of town and that will be helpful  as well as he will be a full part of the time. Readmission: 02/04/2021 upon evaluation today patient appears to be doing well at this point in regard to his left heel that I previously saw him for. Unfortunately he is having issues with his right lower extremity. He has significant wounds at this point he also has erythema noted there is definite signs of cellulitis which is unfortunate. With that being said I think we do need to address this sooner rather than later. The good news is he did have a nice trip to the beach. He tells me that he had no issues during that time. 4/27; patient on Bactrim. Culture showed methicillin sensitive staph aureus therefore the Bactrim should be effective. 2 small areas on the right leg  are healed the area on the mid aspect of the tibia almost 100% covered in a very adherent necrotic debris. We have been using silver alginate 02/20/2021 upon evaluation today patient appears to be doing well with regard to his wound. He is showing signs of improvement which is great news overall very pleased with where things stand today. No fevers, chills, nausea, vomiting, or diarrhea. 02/27/2021 upon evaluation today patient appears to be doing well with regard to his leg ulcer. He is tolerating the dressing changes and overall appears to be doing quite excellent. I am extremely pleased with where things stand and overall I think patient is making great progress. There is no sign of active infection at this time which is also great news. 03/06/2021 upon evaluation today patient appears to be doing excellent in regard to his wounds. In fact he appears to be completely healed today based on what I am seeing. This is excellent news and overall I am extremely pleased with where he stands. Overall the patient is happy to hear this as well this has been quite sometime coming. Readmission: 04/01/2021 patient unfortunately returns for readmission today. He tells me that he has been wearing his compression socks on the right leg daily and he does not really know what is going on and why his legs are doing what they are doing. We did therefore go ahead and probe deeper into exactly what has been going on with him today. Subsequently the patient tells me that when he gets up and what we would call "first thing in the morning" are really 2 different things". He does not tend to sleep well so he tells me that he will often wake up at 3:00 in the morning. He will then potentially going into the living room to get in his chair where he may read a book for a little while and then potentially fall asleep back in his chair. He then subsequently wake up around 5:00 or so and then get up and in his words "putter around".  This often will end with him proceeding at some point around 8 AM or 9 AM to putting on his compression socks. With that being said this means that anywhere from the 3:00 in the morning till roughly around 9:00 in the morning he has no compression on yet he is sitting in his recliner, walking around and up and about, and this is at least 5 to 6 hours of noncompressed time. That may be our issue here but I did not realize until we discussed this further today. Fortunately there does not appear to be any signs of active infection at this time which is great news. With that being said he has multiple wounds  of the bilateral lower extremities. 04/10/2021 upon evaluation today patient appears to be doing well with regard to his legs for the most part. He does look like he had some injury where his wrap slid down nonetheless he did some "doctoring on them". I do feel like most of the areas honestly have cleared back up which is great news. There does not appear to be any signs of active infection at this time which is also great news. No fevers, chills, nausea, vomiting, or diarrhea. 04/18/2021 upon evaluation today patient appears to be doing well with regard to his wounds in fact he appears to be completely healed which is great news. Fortunately there does not appear to be any signs of active infection which is great news. No fevers, chills, nausea, vomiting, or diarrhea. READMISSION 07/28/2021 This is a now 77 year old man we have had in this clinic for quite a bit of this year. 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 He wears compression stockings. 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 He comes in an old running shoes although he says he wears better shoes at home. His history is that he noticed a blister in the right posterior heel. This open. He has been applying Neosporin to it  currently the wound measures 2 x 1.4 cm. 100% slough covered. Not really offloading this in any rigorous way. Past medical history is essentially unchanged he has paroxysmal atrial fibrillation on Coumadin type 2 diabetes with a recent hemoglobin A1c of 7 on metformin. ABI in our clinic was 0.98 on the right 08/05/2021 upon evaluation today patient appears to be doing well with regard to his heel ulcer. Fortunately there does not appear to be any signs of active infection at this time. Overall I been very pleased with where things seem to be currently as far as the wound healing is concerned. Again this is first a lot of seeing him Dr. Dellia Nims readmitted him last week. Nonetheless I think we are definitely making some progress here. 08/11/2021 upon evaluation today patient's wound on the heel actually showing signs of excellent improvement. I am actually very pleased with where things stand currently. No fevers, chills, nausea, vomiting, or diarrhea. There does not appear to be any need for sharp debridement today either which is also great news. 08/25/2021 upon evaluation today patient appears to be doing well with regard to his heel ulcer. This is actually looking significantly improved compared to last time I saw him. Fortunately there does not appear to be any evidence of active infection at this time. No fevers, chills, nausea, vomiting, or diarrhea. 09/01/2021 upon evaluation today patient appears to be doing decently well in regard to his wounds. Fortunately there does not appear to be any signs of active infection at this time which is great news and overall very pleased in that regard. I do not see any evidence of active infection systemically which is great news as well. No fevers, chills, nausea, vomiting, or diarrhea. I think that the heel is making excellent progress. 09/15/2021 upon evaluation today patient's heel unfortunately is significantly worse compared to what it was previous. I do  believe that at this time he would benefit from switching back to something a little bit more offloading from the cushion shoe he has right now this is more of a heel protector what I really need is a Prevalon offloading boot which I think is  good to do a lot better for him than just the small heel protector. 09/23/2021 upon evaluation today patient appears to be doing a little better in regards to last week's visit. Overall I think that he is making good progress which [name], [name] ([id number]) [id number] Page 8 of 10 is great news and there does not appear to be any evidence of active infection at this time. No fevers, chills, nausea, vomiting, or diarrhea. 09/30/2021 upon evaluation patient's heel is actually showing signs of good improvement which is great news. Fortunately there does not appear to be any evidence of active infection locally nor systemically at this point. No fevers, chills, nausea, vomiting, or diarrhea. 10/07/2021 upon evaluation today patient appears to be doing well currently in regard to his heel ulcer. He has been tolerating the dressing changes without complication. Fortunately I do not see any signs of active infection at this time which is great news. No fevers, chills, nausea, vomiting, or diarrhea. 12/27; wound is measuring smaller. We have been using silver alginate 10/28/2021 upon evaluation today patient appears to be doing well currently in regard to his heel ulcer. I am actually very pleased with where things stand and I think he is making excellent progress. Fortunately I do not see any signs of active infection locally nor systemically at this point which is great news. Nonetheless I do believe that the patient would benefit from a new offloading shoe and has been using it Velcro is no longer functioning properly and he tells me that he almost tripped and fell because of that today. Obviously I do not want him following that would be  very bad. 11/11/2021 upon evaluation today patient appears to be doing excellent in regard to his wound. In fact this appears to be completely healed based on what I see currently. I do not see any signs of anything open or draining and I did double check just by clearing away some of the callus around the edges of the wound to ensure that there was nothing still open or hiding underneath. Once this was cleared away it appeared that the patient was doing significantly better at this time which is great news and there was nothing actually open. Readmission: 06-19-2022 upon evaluation today patient appears to be doing well currently in regard to his heel ulcer all things considered. He unfortunately is having a bit of breakdown in general as far as the heel is concerned not nearly as bad as what we noted last time he was here in the clinic. The good news is I do think that this should hopefully heal much more rapidly than what we noted last time. His past medical history has not changed he is on Coumadin. 07-03-2022 upon evaluation today patient appears to be doing better in regard to his heel. We will start to see some improvement here which is good news. Fortunately I do not see any evidence of infection locally or systemically at this time which is great news. No fevers, chills, nausea, vomiting, or diarrhea. 07-10-2022 upon evaluation today patient appears to be doing well currently in regard to his wound from the callus standpoint around the edges. Unfortunately from an actual wound bed standpoint he has some deep tissue injury noted at this point. Again I am not sure exactly what is going on that is causing this pressure to the region but he is definitely gotten pressure that is occurring and causing this to not heal as effectively as what we would like  to see. I discussed with him today that he may need to at night when he sleeping use a pillow up underneath his calf in order to prevent the heel from  touching the bed at all. I also think that it may be beneficial for him to monitor throughout his day and make sure there is no other time when he is getting the pressure to the heel. 07-23-2022 upon evaluation today patient appears to be doing poorly currently in regard to his heel ulcer. He has been tolerating the dressing changes without complication. Unfortunately I feel like he may be infected based on what I am seeing. 07-28-2022 I did review patient's culture results which showed positive for Proteus as well as Staphylococcus both of which would be treated with the Bactrim DS that I gave him at the last visit. This is good news and hopefully means that we should be able to apply the cast today as long as the wound itself does not appear to be doing poorly. Patient's wound bed actually showed signs of doing quite well and I am very pleased with where things stand and I do not see any signs of worsening overall which is great news. 08-04-2022 upon evaluation today patient appears to be doing well currently in regard to his heel ulcer. I am actually very pleased with where things stand and I do think this is looking significantly better. With regard to his right shin that is also showing signs of improvement which is great news. 08-10-2022 upon evaluation today patient's wound on the heel actually appears to be doing significantly better. In regard to the right anterior shin this is showing signs of healing it might even be completely healed but I still get a monitor I cannot get anything to fill away but also could not see where there was anything actually draining at this point. I am tending to think healed but I want a monitor 1 more week before I call it for sure. 08-17-2022 upon evaluation today patient appears to be doing well currently in regard to his heel. Fortunately he is tolerating the dressing changes without complication. I do not see any signs of infection and overall I think he is  making good progress here. We did get approval for the Apligraf but I think he had a $295 co-pay that would have to be paid per application. With that being said as good as he is doing right now I do not think that is necessary but is still an option depending on how things progress if we need to speed this up quite a bit. 08/24/2022; this patient has a wound on his left heel in the setting of type 2 diabetes. We have been using Prisma. He has a heel offloading boot 08-31-2022 upon evaluation today patient appears to be doing poorly in regard to his heel ulcer which is still showing signs of bruising I am just not as happy as I was when I saw him 2 weeks ago with this. He saw Dr. Leanord Hawking last week. Also did not look good at that point apparently. Nonetheless I think that the patient is still continuing to have some counterpressure getting to the wound bed unfortunately. 09-07-2022 upon evaluation today patient appears to be doing well currently in regard to his heel ulcer. He has been tolerating the dressing changes without complication. Fortunately there does not appear to be any signs of active infection locally or systemically at this time. Fortunately I do not see any evidence  of active infection at this time. 09-15-2022 upon evaluation today patient appears to be doing well currently in regard to his legs which in fact appear to be completely healed this is great news. With that being said his heel does have some erythema and warmth as well as drainage I am concerned about cellulitis at this location. 09-21-2022 upon evaluation today patient's wound is actually showing signs of being a little bit smaller but still we are not doing nearly as well as what I would like to see. Fortunately I do not see any signs of infection at this time which is great news and overall I am extremely pleased in that regard. With that being said I do think that he needs to continue to elevate his leg although the edema is  under great control with a compression wrap I think switching to a different dressing topically may be beneficial. My suggestion is to switch him over to a Iodoflex dressing. 09-28-2022 upon evaluation today patient appears to be doing well with regard to his heel ulcer. This is actually showing signs of improvement which is great news and overall I am extremely pleased with where we stand today. 10-06-2022 upon evaluation today patient actually appears to be making excellent progress at this point. Fortunately there does not appear to be any signs of active infection locally nor systemically which is great news and overall I am extremely pleased with where we stand. I do feel like he is actually making some pretty good progress here as we switch to the wrap along with the Iodoflex. 12/26; patient has a wound on the tip of his left heel. This is not a weightbearing surface. He is using a heel offloading boot and carefully offloading this at other times during the day. He is using Iodoflex and Zetuvitunder 3 layer compression 10-22-2022 upon evaluation today patient appears to be doing well currently in regard to his wound. He has been tolerating the dressing changes without complication. Fortunately there does not appear to be any signs of active infection locally nor systemically at this time. No fevers, chills, nausea, vomiting, or diarrhea. 08-30-2023 upon evaluation today patient appears to be doing well currently in regard to his heel ulcer which is showing signs of good improvement. Fortunately I do not see any evidence of infection locally nor systemically which is great news and overall I am extremely pleased with where we stand. No fevers, chills, nausea, vomiting, or diarrhea. Unfortunately he does have a wound on the side of his fifth toe where I think this did rub on the shoe. 11-05-2022 upon evaluation today patient still still showing some signs of pressure at this point and there may have  even been some fluid collection at this time. Fortunately I do not see any evidence of active infection locally nor systemically which is great news and overall I am extremely pleased with where we [name], [name] ([id number]) [id number] Page 9 of 10 stand. No fevers, chills, nausea, vomiting, or diarrhea. Objective Constitutional Well-nourished and well-hydrated in no acute distress. Vitals Time Taken: 2:17 PM, Height: 70 in, Weight: 265 lbs, BMI: 38, Temperature: 98.2 F, Pulse: 66 bpm, Respiratory Rate: 18 breaths/min, Blood Pressure: 140/74 mmHg. Respiratory normal breathing without difficulty. Psychiatric this patient is able to make decisions and demonstrates good insight into disease process. Alert and Oriented x 3. pleasant and cooperative. General Notes: Upon inspection patient's wound bed actually did not require sharp debridement today which was good news. Fortunately I do not  see any evidence of infection locally nor systemically which is great news as well and in general I think that we are on the right track. I do believe that he is going to require continuation of the compression wrapping which seems to be doing extremely well for him. I am going to avoid any gauze applied over top of the Iodoflex which is directly applied to the Zetuvit. Hopefully this will be better as far as making sure nothing rubs or clicks are trapped in the fluid. Integumentary (Hair, Skin) Wound #10 status is Open. Original cause of wound was Gradually Appeared. The date acquired was: 06/05/2022. The wound has been in treatment 19 weeks. The wound is located on the Left Calcaneus. The wound measures 1.4cm length x 2.4cm width x 0.2cm depth; 2.639cm^2 area and 0.528cm^3 volume. There is Fat Layer (Subcutaneous Tissue) exposed. There is a medium amount of serosanguineous drainage noted. The wound margin is flat and intact. There is small (1-33%) pink granulation within the wound  bed. There is a large (67-100%) amount of necrotic tissue within the wound bed including Adherent Slough. Wound #14 status is Open. Original cause of wound was Shear/Friction. The date acquired was: 10/29/2022. The wound has been in treatment 1 weeks. The wound is located on the Left T Fifth. The wound measures 0.2cm length x 0.4cm width x 0.1cm depth; 0.063cm^2 area and 0.006cm^3 volume. There is oe fascia exposed. There is a medium amount of sanguinous drainage noted. 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 Bathing/ Shower/ Hygiene: May shower; gently cleanse wound with antibacterial soap, rinse and pat dry prior to dressing wounds Edema Control - Lymphedema / Segmental Compressive Device / Other: 3 Layer Compression System for Lymphedema. - left lower leg Non-Wound Condition: Additional non-wound orders/instructions: - Apply 40% Urea cream to dry cracked skin on feet. Off-Loading: Open toe surgical shoe with peg assist. WOUND #10: - Calcaneus Wound Laterality: Left Prim Dressing: IODOFLEX 0.9% Cadexomer Iodine Pad 3 x Per Week/30 Days ary Discharge Instructions: Apply Iodoflex to wound bed only as directed. Secondary Dressing: Zetuvit Plus 4x8 (in/in) 3 x Per Week/30 Days Com pression Wrap: 3-LAYER WRAP - Profore Lite LF 3 Multilayer Compression Bandaging System 3 x Per Week/30 Days Discharge Instructions: Apply 3 multi-layer wrap as prescribed. WOUND #14: - T Fifth Wound Laterality: Left oe Prim Dressing: Xeroform-HBD 2x2 (in/in) 3 x Per Week/30 Days ary Discharge Instructions: Apply Xeroform-HBD 2x2 (in/in) as directed Secondary Dressing: Coverlet Latex-Free Fabric Adhesive Dressings 3 x Per Week/30 Days Discharge Instructions:  1.5 x 2 1. I am going to recommend that we have the patient continue to monitor for any signs of infection or worsening. Basically I think at this point we will get a continue with the plan but will avoid any gauze in between the Iodoflex and the Zetuvit and just see how this does I think that may do much better as far as [name], [name] ([id number]) [id number] Page 10 of 10 preventing any rubbing or fluid collection here. 2. Also can recommend that we continue with the Xeroform over the fifth toe which is actually doing quite well. The new open toed shoes is not rubbing as much on this area. We will see patient back for reevaluation in 1 week here in the clinic. If  anything worsens or changes patient will contact our office for additional recommendations. Electronic Signature(s) Signed: 11/05/2022 5:35:40 PM By: Lenda Kelp PA-C Entered By: Lenda Kelp on 11/05/2022 17:35:39 -------------------------------------------------------------------------------- SuperBill Details Patient Name: Date of Service: [name], [name] [name] 11/05/2022 Medical Record Number: [id number] Patient Account Number: [id number] Date of Birth/Sex: Treating RN: 1946-07-17 (77 y.o. [name], [name] Primary Care Provider: [name] Other Clinician: Betha Loa Referring Provider: Treating Provider/Extender: Hermine Messick Weeks in Treatment: 19 Diagnosis Coding ICD-10 Codes Code Description E11.621 Type 2 diabetes mellitus with foot ulcer 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sed E11.42 Type 2 diabetes mellitus with diabetic polyneuropathy Facility Procedures : CPT4 Code: [id number] Description: (Facility Use Only) [id number] - APPLY MULTLAY COMPRS LWR LT LEG ICD-10 Diagnosis Description E11.621 Type 2 diabetes mellitus with foot ulcer Modifier: Quantity: 1 Physician Procedures : CPT4 Code Description Modifier [id number]  [id number] - WC PHYS LEVEL 3 - EST PT ICD-10 Diagnosis Description E11.621 Type 2 diabetes mellitus with foot ulcer 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sed E11.42 Type 2  diabetes mellitus with diabetic polyneuropathy Quantity: 1 Electronic Signature(s) Signed: 11/05/2022 5:35:57 PM By: Lenda Kelp PA-C Entered By: Lenda Kelp on 11/05/2022 17:35:57

## 2022-11-05 NOTE — Progress Notes (Addendum)
[name] ([id number]) [id number] Page 1 of 10 Visit Report for 11/05/2022 Arrival Information Details Patient Name: Date of Service: [name], [name] [name] 11/05/2022 2:00 PM Medical Record Number: [id number] Patient Account Number: [id number] Date of Birth/Sex: Treating RN: 1946/01/28 (77 y.o. [name] Primary Care [name]: Fulton Reek Other Clinician: Massie Kluver Referring [name]: Treating [name]/Extender: [name] in Treatment: 19 Visit Information History Since Last Visit All ordered tests and consults were completed: No Patient Arrived: [name] Added or deleted any medications: No Arrival Time: 14:08 Any new allergies or adverse reactions: No Transfer Assistance: None Had a fall or experienced change in No Patient Identification Verified: Yes activities of daily living that may affect Secondary Verification Process Completed: Yes risk of falls: Patient Requires Transmission-Based Precautions: No Signs or symptoms of abuse/neglect since No Patient Has Alerts: Yes last visito Patient Alerts: Patient on Blood Thinner Hospitalized since last visit: No Warfarin Implantable device outside of the clinic No Type II Diabetic excluding cellular tissue based products placed in the center since last visit: Has Dressing in Place as Prescribed: Yes Has Compression in Place as Prescribed: Yes Has Footwear/Offloading in Place as Yes Prescribed: Left: Surgical Shoe with Pressure Relief Insole Pain Present Now: No Electronic Signature(s) Signed: 11/06/2022 9:50:53 AM By: Massie Kluver Entered By: Massie Kluver on 11/05/2022 14:16:57 -------------------------------------------------------------------------------- Clinic Level of Care Assessment Details Patient Name: Date of Service: [name], [name] [name] 11/05/2022 2:00 PM Medical Record Number: [id number] Patient Account Number: [id number] Date of Birth/Sex:  Treating RN: [birth year] (77 y.o. [name] Primary Care [name]: Fulton Reek Other Clinician: Massie Kluver Referring [name]: Treating [name]/Extender: [name] in Treatment: [place] Assessment Items MARKEISE, MATHEWS ([id number]) [phone].pdf Page 2 of 10 TOOL 1 Quantity Score []  - 0 Use when EandM and Procedure is performed on INITIAL visit ASSESSMENTS - Nursing Assessment / Reassessment []  - 0 [name] Physical Exam (combine w/ comprehensive assessment (listed just below) when performed on new pt. evals) []  - 0 Comprehensive Assessment (HX, ROS, Risk Assessments, Wounds Hx, etc.) ASSESSMENTS - Wound and Skin Assessment / Reassessment []  - 0 Dermatologic / Skin Assessment (not related to wound area) ASSESSMENTS - Ostomy and/or Continence Assessment and Care []  - 0 Incontinence Assessment and Management []  - 0 Ostomy Care Assessment and Management (repouching, etc.) PROCESS - Coordination of Care []  - 0 Simple Patient / Family Education for ongoing care []  - 0 Complex (extensive) Patient / Family Education for ongoing care []  - 0 Staff obtains Programmer, systems, Records, T Results / Process Orders est []  - 0 Staff telephones HHA, Nursing Homes / Clarify orders / etc []  - 0 Routine Transfer to another Facility (non-emergent condition) []  - 0 Routine Hospital Admission (non-emergent condition) []  - 0 New Admissions / Biomedical engineer / Ordering NPWT Apligraf, etc. , []  - 0 Emergency Hospital Admission (emergent condition) PROCESS - Special Needs []  - 0 Pediatric / Minor Patient Management []  - 0 Isolation Patient Management []  - 0 Hearing / Language / Visual special needs []  - 0 Assessment of Community assistance (transportation, D/C planning, etc.) []  - 0 Additional assistance / Altered mentation []  - 0 Support Surface(s) Assessment (bed, cushion, seat, etc.) INTERVENTIONS -  Miscellaneous []  - 0 External ear exam []  - 0 Patient Transfer (multiple staff / Civil Service fast streamer / Similar devices) []  - 0 Simple Staple / Suture removal (25 or less) []  - 0 Complex Staple / Suture removal (  26 or more) []  - 0 Hypo/Hyperglycemic Management (do not check if billed separately) []  - 0 Ankle / Brachial Index (ABI) - do not check if billed separately Has the patient been seen at the hospital within the last three years: Yes Total Score: 0 Level Of Care: ____ Electronic Signature(s) Signed: 11/06/2022 9:50:53 AM By: Massie Kluver Entered By: Massie Kluver on 11/05/2022 15:15:02 [name] ([id number]) [id number] Page 3 of 10 -------------------------------------------------------------------------------- Encounter Discharge Information Details Patient Name: Date of Service: [name], [name] [name] 11/05/2022 2:00 PM Medical Record Number: [id number] Patient Account Number: [id number] Date of Birth/Sex: Treating RN: April 23, 1946 (77 y.o. [name] Primary Care [name]: Fulton Reek Other Clinician: Massie Kluver Referring Amador Braddy: Treating [name]/Extender: [name] in Treatment: 19 Encounter Discharge Information Items Discharge Condition: Stable Ambulatory Status: Walker Discharge Destination: Home Transportation: Private Auto Accompanied By: self Schedule Follow-up Appointment: Yes Clinical Summary of Care: Electronic Signature(s) Signed: 11/06/2022 9:50:53 AM By: Massie Kluver Entered By: Massie Kluver on 11/05/2022 15:16:48 -------------------------------------------------------------------------------- Lower Extremity Assessment Details Patient Name: Date of Service: [name], [name] [name] 11/05/2022 2:00 PM Medical Record Number: [id number] Patient Account Number: [id number] Date of Birth/Sex: Treating RN: [birth year] (77 y.o. [name] Primary Care [name]: Fulton Reek Other Clinician:  Massie Kluver Referring [name]: Treating [name]/Extender: Maurilio Lovely Weeks in Treatment: 19 Edema Assessment Assessed: [name]: Yes] [name]: No] Edema: [Left: Ye] [Right: s] Calf Left: Right: Point of Measurement: 33 cm From Medial Instep 39.4 cm Ankle Left: Right: Point of Measurement: 11 cm From Medial Instep 22.8 cm Vascular Assessment Pulses: Dorsalis Pedis Palpable: [Left:Yes] Electronic Signature(s) Signed: 11/05/2022 6:11:46 PM By: [name], BSN, RN, CWS, [name] RN, BSN Signed: 11/06/2022 9:50:53 AM By: Massie Kluver Entered By: Massie Kluver on 11/05/2022 14:35:38 [name] ([id number]) [id number] Page 4 of 10 -------------------------------------------------------------------------------- Multi Wound Chart Details Patient Name: Date of Service: [name], [name] [name] 11/05/2022 2:00 PM Medical Record Number: [id number] Patient Account Number: [id number] Date of Birth/Sex: Treating RN: 03-16-1946 (77 y.o. 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 N/A Primary Etiology: Extremity Pressure Ulcer N/A N/A Secondary Etiology: Cataracts, Arrhythmia, Coronary Cataracts, Arrhythmia, Coronary N/A Comorbid History: Artery Disease, Hypertension, Type II Artery Disease, Hypertension, Type II Diabetes, Osteoarthritis, Neuropathy Diabetes, Osteoarthritis, Neuropathy 06/05/2022 10/29/2022 N/A Date Acquired: 36 1 N/A Weeks of Treatment: Open  Open N/A Wound Status: No No N/A Wound Recurrence: 1.4x2.4x0.2 0.2x0.4x0.1 N/A Measurements L x W x D (cm) 2.639 0.063 N/A A (cm) : rea 0.528 0.006 N/A Volume (cm) : -86.60% 71.40% N/A % Reduction in A rea: -86.60% 72.70% N/A % Reduction in Volume: Grade 1 Unstageable/Unclassified N/A Classification: Medium Medium N/A Exudate A mount: Serosanguineous Sanguinous N/A Exudate Type: red, brown red N/A Exudate Color: Flat and Intact N/A N/A Wound Margin: Small (1-33%) N/A N/A Granulation A mount: Pink N/A N/A Granulation Quality: Large (67-100%) N/A N/A Necrotic A mount: Fat Layer (Subcutaneous Tissue): Yes Fascia: Yes N/A Exposed Structures: Fascia: No Fat Layer (Subcutaneous Tissue): No Tendon: No Tendon: No Muscle: No Muscle: No Joint: No Joint: No Bone: No Bone: No Small (1-33%) None N/A Epithelialization:  Treatment Notes Electronic Signature(s) Signed: 11/06/2022 9:50:53 AM By: Betha Loa Entered By: Betha Loa on 11/05/2022 14:35:50 Barrie Folk ([id number]) [id number] Page 5 of 10 -------------------------------------------------------------------------------- Multi-Disciplinary Care Plan Details Patient Name: Date of Service: [name], [name] [name] 11/05/2022 2:00 PM Medical Record Number: [id number] Patient Account Number: [id number] Date of Birth/Sex: Treating RN: 1946/06/22 (77 y.o. [name], [name] Primary Care Jakevion Arney: [name] Other Clinician: Betha Loa Referring Chelci Wintermute: Treating Rolf Fells/Extender: [name] in Treatment: 19 Active Inactive Pressure Nursing Diagnoses: Knowledge deficit related to causes and risk factors for pressure ulcer development Knowledge deficit related to management of pressures ulcers Potential for impaired tissue integrity related to pressure, friction, moisture, and shear Goals: Patient will remain free from development of additional pressure  ulcers Date Initiated: 06/19/2022 Date Inactivated: 07/28/2022 Target Resolution Date: 06/19/2022 Goal Status: Met Patient/caregiver will verbalize risk factors for pressure ulcer development Date Initiated: 06/19/2022 Date Inactivated: 07/28/2022 Target Resolution Date: 06/19/2022 Goal Status: Met Patient/caregiver will verbalize understanding of pressure ulcer management Date Initiated: 06/19/2022 Target Resolution Date: 08/14/2022 Goal Status: Active Interventions: Assess: immobility, friction, shearing, incontinence upon admission and as needed Assess offloading mechanisms upon admission and as needed Assess potential for pressure ulcer upon admission and as needed Provide education on pressure ulcers Notes: Wound/Skin Impairment Nursing Diagnoses: Impaired tissue integrity Goals: Patient/caregiver will verbalize understanding of skin care regimen Date Initiated: 06/19/2022 Date Inactivated: 07/10/2022 Target Resolution Date: 06/19/2022 Goal Status: Met Ulcer/skin breakdown will have a volume reduction of 30% by week 4 Date Initiated: 06/19/2022 Date Inactivated: 07/28/2022 Target Resolution Date: 07/17/2022 Goal Status: Unmet Unmet Reason: infection Ulcer/skin breakdown will have a volume reduction of 50% by week 8 Date Initiated: 07/28/2022 Target Resolution Date: 08/14/2022 Goal Status: Active Interventions: Assess patient/caregiver ability to obtain necessary supplies Assess patient/caregiver ability to perform ulcer/skin care regimen upon admission and as needed Assess ulceration(s) every visit Provide education on ulcer and skin care Treatment Activities: [name], [name] ([id number]) [phone].pdf Page 6 of 10 Skin care regimen initiated : 06/19/2022 Topical wound management initiated : 06/19/2022 Notes: Electronic Signature(s) Signed: 11/05/2022 6:11:46 PM By: Elliot Gurney, BSN, RN, CWS, [name] RN, BSN Signed: 11/06/2022 9:50:53 AM By: Betha Loa Entered By:  Betha Loa on 11/05/2022 15:16:00 -------------------------------------------------------------------------------- Pain Assessment Details Patient Name: Date of Service: [name], [name] [name] 11/05/2022 2:00 PM Medical Record Number: [id number] Patient Account Number: [id number] Date of Birth/Sex: Treating RN: 08-09-1946 (77 y.o. [name] Primary Care Quantia Grullon: [name] Other Clinician: Betha Loa Referring Kalynne Womac: Treating Pura Picinich/Extender: Hermine Messick Weeks in Treatment: 19 Active Problems Location of Pain Severity and Description of Pain Patient Has Paino No Site Locations Pain Management and Medication Current Pain Management: Electronic Signature(s) Signed: 11/05/2022 6:11:46 PM By: Elliot Gurney, BSN, RN, CWS, [name] RN, BSN Signed: 11/06/2022 9:50:53 AM By: Betha Loa Entered By: Betha Loa on 11/05/2022 14:22:23 Barrie Folk ([id number]) [id number] Page 7 of 10 -------------------------------------------------------------------------------- Patient/Caregiver Education Details Patient Name: Date of Service: [name] [facility] 1/18/2024andnbsp2:00 PM Medical Record Number: [id number] Patient Account Number: [id number] Date of Birth/Gender: Treating RN: 01-04-1946 (77 y.o. [name] Primary Care Physician: [name] Other Clinician: Betha Loa Referring Physician: Treating Physician/Extender: [name] in Treatment: 19 Education Assessment Education Provided To: Patient Education Topics Provided Wound/Skin Impairment: Handouts: Other: continue wound care as directed Methods: Explain/Verbal Responses: State content correctly Electronic Signature(s) Signed: 11/06/2022 9:50:53 AM By: Betha Loa Entered By: Betha Loa on 11/05/2022 15:15:51 --------------------------------------------------------------------------------  Wound Assessment Details Patient Name:  Date of Service: [name], [name] [name] 11/05/2022 2:00 PM Medical Record Number: [id number] Patient Account Number: [id number] Date of Birth/Sex: Treating RN: [birth year] (77 y.o. [name] Primary Care Harmoney Sienkiewicz: [name] Other Clinician: Betha Loa Referring Mir Fullilove: Treating Rashard Ryle/Extender: Hermine Messick Weeks in Treatment: 19 Wound Status Wound Number: 10 Primary Diabetic Wound/Ulcer of the Lower Extremity Etiology: Wound Location: Left Calcaneus Secondary Pressure Ulcer Wounding Event: Gradually Appeared Etiology: Date Acquired: 06/05/2022 Wound Open Weeks Of Treatment: 19 Status: Clustered Wound: No Comorbid Cataracts, Arrhythmia, Coronary Artery Disease, Hypertension, History: Type II Diabetes, Osteoarthritis, Neuropathy Photos [name], [name] ([id number]) [phone].pdf Page 8 of 10 Wound Measurements Length: (cm) 1.4 Width: (cm) 2.4 Depth: (cm) 0.2 Area: (cm) 2.639 Volume: (cm) 0.528 % Reduction in Area: -86.6% % Reduction in Volume: -86.6% Epithelialization: Small (1-33%) Wound Description Classification: Grade 1 Wound Margin: Flat and Intact Exudate Amount: Medium Exudate Type: Serosanguineous Exudate Color: red, brown Foul Odor After Cleansing: No Slough/Fibrino Yes Wound Bed Granulation Amount: Small (1-33%) Exposed Structure Granulation Quality: Pink Fascia Exposed: No Necrotic Amount: Large (67-100%) Fat Layer (Subcutaneous Tissue) Exposed: Yes Necrotic Quality: Adherent Slough Tendon Exposed: No Muscle Exposed: No Joint Exposed: No Bone Exposed: No Treatment Notes Wound #10 (Calcaneus) Wound Laterality: Left Cleanser Peri-Wound Care Topical Primary Dressing IODOFLEX 0.9% Cadexomer Iodine Pad Discharge Instruction: Apply Iodoflex to wound bed only as directed. Secondary Dressing Zetuvit Plus 4x8 (in/in) Secured With Compression Wrap 3-LAYER WRAP - Profore Lite LF 3 Multilayer  Compression Bandaging System Discharge Instruction: Apply 3 multi-layer wrap as prescribed. Compression Stockings Add-Ons Electronic Signature(s) Signed: 11/05/2022 6:11:46 PM By: Elliot Gurney, BSN, RN, CWS, [name] RN, BSN Signed: 11/06/2022 9:50:53 AM By: Betha Loa Entered By: Betha Loa on 11/05/2022 14:34:05 Barrie Folk ([id number]) [id number] Page 9 of 10 -------------------------------------------------------------------------------- Wound Assessment Details Patient Name: Date of Service: [name], [name] [name] 11/05/2022 2:00 PM Medical Record Number: [id number] Patient Account Number: [id number] Date of Birth/Sex: Treating RN: Jul 08, 1946 (77 y.o. [name], [name] Primary Care Sabrin Dunlevy: [name] Other Clinician: Betha Loa Referring Kripa Foskey: Treating Keoshia Steinmetz/Extender: Hermine Messick Weeks in Treatment: 19 Wound Status Wound Number: 14 Primary Pressure Ulcer Etiology: Wound Location: Left T Fifth oe Wound Open Wounding Event: Shear/Friction Status: Date Acquired: 10/29/2022 Comorbid Cataracts, Arrhythmia, Coronary Artery Disease, Hypertension, Weeks Of Treatment: 1 History: Type II Diabetes, Osteoarthritis, Neuropathy Clustered Wound: No Photos Wound Measurements Length: (cm) 0.2 Width: (cm) 0.4 Depth: (cm) 0.1 Area: (cm) 0.063 Volume: (cm) 0.006 % Reduction in Area: 71.4% % Reduction in Volume: 72.7% Epithelialization: None Wound Description Classification: Unstageable/Unclassified Exudate Amount: Medium Exudate Type: Sanguinous Exudate Color: red Foul Odor After Cleansing: No Slough/Fibrino No Wound Bed Exposed Structure Fascia Exposed: Yes Fat Layer (Subcutaneous Tissue) Exposed: No Tendon Exposed: No Muscle Exposed: No Joint Exposed: No Bone Exposed: No Treatment Notes Wound #14 (Toe Fifth) Wound Laterality: Left Cleanser Peri-Wound Care Topical [name], [name] ([id number])  [phone].pdf Page 10 of 10 Primary Dressing Xeroform-HBD 2x2 (in/in) Discharge Instruction: Apply Xeroform-HBD 2x2 (in/in) as directed Secondary Dressing Coverlet Latex-Free Fabric Adhesive Dressings Discharge Instruction: 1.5 x 2 Secured With Compression Wrap Compression Stockings Add-Ons Electronic Signature(s) Signed: 11/05/2022 6:11:46 PM By: Elliot Gurney, BSN, RN, CWS, [name] RN, BSN Signed: 11/06/2022 9:50:53 AM By: Betha Loa Entered By: Betha Loa on 11/05/2022 14:34:40 -------------------------------------------------------------------------------- Vitals Details Patient Name: Date of Service: [name], [name] [name] 11/05/2022 2:00 PM Medical Record Number: [id number] Patient Account Number: [id number] Date of Birth/Sex:  Treating RN: Aug 28, 1946 (77 y.o. [name], [name] Primary Care Praveen Coia: [name] Other Clinician: Betha Loa Referring Shylyn Younce: Treating Shedric Fredericks/Extender: Hermine Messick Weeks in Treatment: 19 Vital Signs Time Taken: 14:17 Temperature (F): 98.2 Height (in): 70 Pulse (bpm): 66 Weight (lbs): 265 Respiratory Rate (breaths/min): 18 Body Mass Index (BMI): 38 Blood Pressure (mmHg): 140/74 Reference Range: 80 - 120 mg / dl Electronic Signature(s) Signed: 11/06/2022 9:50:53 AM By: Betha Loa Entered By: Betha Loa on 11/05/2022 14:22:08

## 2022-11-12 ENCOUNTER — Encounter: Payer: Medicare HMO | Admitting: Internal Medicine

## 2022-11-12 DIAGNOSIS — E11621 Type 2 diabetes mellitus with foot ulcer: Secondary | ICD-10-CM | POA: Diagnosis not present

## 2022-11-17 NOTE — Progress Notes (Signed)
[name], [name] ([id number]) [id number] Page 1 of 10 Visit Report for 11/12/2022 Arrival Information Details Patient Name: Date of Service: [name], [name] [facility] J. 11/12/2022 11:00 A M Medical Record Number: [id number] Patient Account Number: [id number] Date of Birth/Sex: Treating RN: 04/08/1946 (77 y.o. [name] Primary Care Ronie Barnhart: Fulton Reek Other Clinician: Massie Kluver Referring Amos Gaber: Treating Tayah Idrovo/Extender: RO BSO N, MICHA EL Ginny Forth in Treatment: 20 Visit Information History Since Last Visit All ordered tests and consults were completed: No Patient Arrived: Wheel Chair Added or deleted any medications: No Arrival Time: 11:05 Any new allergies or adverse reactions: No Transfer Assistance: None Had a fall or experienced change in No Patient Identification Verified: Yes activities of daily living that may affect Secondary Verification Process Completed: Yes risk of falls: Patient Requires Transmission-Based Precautions: No Signs or symptoms of abuse/neglect since No Patient Has Alerts: Yes last visito Patient Alerts: Patient on Blood Thinner Hospitalized since last visit: No Warfarin Implantable device outside of the clinic No Type II Diabetic excluding cellular tissue based products placed in the center since last visit: Has Dressing in Place as Prescribed: Yes Has Compression in Place as Prescribed: Yes Has Footwear/Offloading in Place as Yes Prescribed: Left: Surgical Shoe with Pressure Relief Insole Pain Present Now: No Electronic Signature(s) Signed: 11/17/2022 4:19:16 PM By: Massie Kluver Entered By: Massie Kluver on 11/12/2022 11:06:05 -------------------------------------------------------------------------------- Clinic Level of Care Assessment Details Patient Name: Date of Service: [name] [facility] J. 11/12/2022 11:00 A M Medical Record Number: [id number] Patient Account Number:  [id number] Date of Birth/Sex: Treating RN: 05/24/1946 (77 y.o. [name] Primary Care Douglas Smolinsky: Fulton Reek Other Clinician: Massie Kluver Referring Terralyn Matsumura: Treating Belisa Eichholz/Extender: Eldridge Dace, MICHA EL Ginny Forth in Treatment: [facility] Level of Care Assessment Items [name], [name] ([id number]) [phone].pdf Page 2 of 10 TOOL 1 Quantity Score []  - 0 Use when EandM and Procedure is performed on INITIAL visit ASSESSMENTS - Nursing Assessment / Reassessment []  - 0 General Physical Exam (combine w/ comprehensive assessment (listed just below) when performed on new pt. evals) []  - 0 Comprehensive Assessment (HX, ROS, Risk Assessments, Wounds Hx, etc.) ASSESSMENTS - Wound and Skin Assessment / Reassessment []  - 0 Dermatologic / Skin Assessment (not related to wound area) ASSESSMENTS - Ostomy and/or Continence Assessment and Care []  - 0 Incontinence Assessment and Management []  - 0 Ostomy Care Assessment and Management (repouching, etc.) PROCESS - Coordination of Care []  - 0 Simple Patient / Family Education for ongoing care []  - 0 Complex (extensive) Patient / Family Education for ongoing care []  - 0 Staff obtains Programmer, systems, Records, T Results / Process Orders est []  - 0 Staff telephones HHA, Nursing Homes / Clarify orders / etc []  - 0 Routine Transfer to another Facility (non-emergent condition) []  - 0 Routine Hospital Admission (non-emergent condition) []  - 0 New Admissions / Biomedical engineer / Ordering NPWT Apligraf, etc. , []  - 0 Emergency Hospital Admission (emergent condition) PROCESS - Special Needs []  - 0 Pediatric / Minor Patient Management []  - 0 Isolation Patient Management []  - 0 Hearing / Language / Visual special needs []  - 0 Assessment of Community assistance (transportation, D/C planning, etc.) []  - 0 Additional assistance / Altered mentation []  - 0 Support Surface(s) Assessment (bed, cushion,  seat, etc.) INTERVENTIONS - Miscellaneous []  - 0 External ear exam []  - 0 Patient Transfer (multiple staff / Civil Service fast streamer / Similar devices) []  - 0 Simple Staple / Suture removal (  25 or less) []  - 0 Complex Staple / Suture removal (26 or more) []  - 0 Hypo/Hyperglycemic Management (do not check if billed separately) []  - 0 Ankle / Brachial Index (ABI) - do not check if billed separately Has the patient been seen at the hospital within the last three years: Yes Total Score: 0 Level Of Care: ____ Electronic Signature(s) Signed: 11/17/2022 4:19:16 PM By: Massie Kluver Entered By: Massie Kluver on 11/12/2022 11:28:56 [name] ([id number]) [id number] Page 3 of 10 -------------------------------------------------------------------------------- Compression Therapy Details Patient Name: Date of Service: [name], [name] [name] 11/12/2022 11:00 A M Medical Record Number: [id number] Patient Account Number: [id number] Date of Birth/Sex: Treating RN: 09/28/1946 (77 y.o. [name] Primary Care Neri Samek: Fulton Reek Other Clinician: Massie Kluver Referring Rukia Mcgillivray: Treating Vanesha [place]/Extender: RO BSO N, MICHA EL Ginny Forth in Treatment: 20 Compression Therapy Performed for Wound Assessment: Wound #10 Left Calcaneus Performed By: Lenice Pressman, Angie, Compression Type: Three Layer Pre Treatment ABI: 1 Post Procedure Diagnosis Same as Pre-procedure Electronic Signature(s) Signed: 11/17/2022 4:19:16 PM By: Massie Kluver Entered By: Massie Kluver on 11/12/2022 11:28:00 -------------------------------------------------------------------------------- Encounter Discharge Information Details Patient Name: Date of Service: [name], [name] [name] 11/12/2022 11:00 A M Medical Record Number: [id number] Patient Account Number: [id number] Date of Birth/Sex: Treating RN: 11-21-1945 (78 y.o. [name] Primary Care Hazael Olveda: Fulton Reek Other Clinician: Massie Kluver Referring Larri Yehle: Treating Jearldean Gutt/Extender: RO BSO N, MICHA EL Ginny Forth in Treatment: 20 Encounter Discharge Information Items Post Procedure Vitals Discharge Condition: Stable Temperature (F): 98.5 Ambulatory Status: Walker Pulse (bpm): 72 Discharge Destination: Home Respiratory Rate (breaths/min): 18 Transportation: Private Auto Blood Pressure (mmHg): 147/70 Accompanied By: self Schedule Follow-up Appointment: Yes Clinical Summary of Care: Electronic Signature(s) Signed: 11/17/2022 4:19:16 PM By: Massie Kluver Entered By: Massie Kluver on 11/12/2022 15:39:24 -------------------------------------------------------------------------------- Lower Extremity Assessment Details Patient Name: Date of Service: [name], [name] [name] 11/12/2022 11:00 A [name], [name] ([id number]) [phone].pdf Page 4 of 10 Medical Record Number: [id number] Patient Account Number: [id number] Date of Birth/Sex: Treating RN: Nov 13, 1945 (77 y.o. [name] Primary Care Darrielle Pflieger: Fulton Reek Other Clinician: Massie Kluver Referring Kaila Devries: Treating Kalii Chesmore/Extender: RO BSO N, MICHA EL Ginny Forth in Treatment: 20 Edema Assessment Assessed: [Left: Yes] [Right: No] Edema: [Left: Ye] [Right: s] Calf Left: Right: Point of Measurement: 33 cm From Medial Instep 38.9 cm Ankle Left: Right: Point of Measurement: 11 cm From Medial Instep 23.5 cm Vascular Assessment Pulses: Dorsalis Pedis Palpable: [Left:Yes] Electronic Signature(s) Signed: 11/12/2022 3:12:14 PM By: [name], BSN, RN, CWS, [name] RN, BSN Signed: 11/17/2022 4:19:16 PM By: Massie Kluver Entered By: Massie Kluver on 11/12/2022 11:20:49 -------------------------------------------------------------------------------- Multi Wound Chart Details Patient Name: Date of Service: [name], [name] [name] 11/12/2022 11:00 A M Medical Record Number:  [id number] Patient Account Number: [id number] Date of Birth/Sex: Treating RN: 1946/01/16 (77 y.o. 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 N/A Primary Etiology: Extremity Pressure Ulcer N/A N/A Secondary Etiology: Cataracts, Arrhythmia, Coronary Cataracts, Arrhythmia, Coronary N/A Comorbid History: Artery Disease,  Hypertension, Type II Artery Disease, Hypertension, Type II Diabetes, Osteoarthritis, Neuropathy Diabetes, Osteoarthritis, Neuropathy 06/05/2022 10/29/2022 N/A Date Acquired: 20 2 N/A Weeks of Treatment: Open Open N/A Wound Status: No No N/A Wound Recurrence: 1x2.4x0.2 0.1x0.1x0.1 N/A Measurements L x W x D (cm) 1.885 0.008 N/A A (cm) : rea 0.377 0.001 N/A Volume (cm) : -33.30% 96.40% N/A % Reduction in A rea: -33.20% 95.50% N/A % Reduction in Volume: Grade 1 Unstageable/Unclassified N/A Classification: Medium None Present N/A Exudate A mount: Serosanguineous N/A N/A Exudate Type: red, brown N/A N/A Exudate Color: Flat and Intact N/A N/A Wound Margin: Small (1-33%) N/A N/A Granulation A mount: Pink N/A N/A Granulation Quality: Large (67-100%) N/A N/A Necrotic A mount: Fat Layer (Subcutaneous Tissue): Yes Fascia: Yes N/A Exposed Structures: Fascia: No Fat Layer (Subcutaneous Tissue): No Tendon: No Tendon: No Muscle: No Muscle: No Joint: No Joint: No Bone:  No Bone: No Small (1-33%) Large (67-100%) N/A Epithelialization: Treatment Notes Electronic Signature(s) Signed: 11/17/2022 4:19:16 PM By: Massie Kluver Entered By: Massie Kluver on 11/12/2022 11:20:57 -------------------------------------------------------------------------------- Multi-Disciplinary Care Plan Details Patient Name: Date of Service: [name], [name] [name] 11/12/2022 11:00 A M Medical Record Number: [id number] Patient Account Number: [id number] Date of Birth/Sex: Treating RN: Sep 03, 1946 (77 y.o. [name] Primary Care Gwyn Hieronymus: Fulton Reek Other Clinician: Massie Kluver Referring Toniyah Dilmore: Treating Devaun Hernandez/Extender: RO BSO N, MICHA EL Renita Papa, Hughes Better in Treatment: 20 Active Inactive Pressure Nursing Diagnoses: Knowledge deficit related to causes and risk factors for pressure ulcer development Knowledge deficit related to management of pressures ulcers Potential for impaired tissue integrity related to pressure, friction, moisture, and shear Goals: Patient will remain free from development of additional pressure ulcers Date Initiated: 06/19/2022 Date Inactivated: 07/28/2022 Target Resolution Date: 06/19/2022 Goal Status: Met Patient/caregiver will verbalize risk factors for pressure ulcer development Date Initiated: 06/19/2022 Date Inactivated: 07/28/2022 Target Resolution Date: 06/19/2022 Goal Status: Met [name], [name] ([id number]) [id number] Page 6 of 10 Patient/caregiver will verbalize understanding of pressure ulcer management Date Initiated: 06/19/2022 Target Resolution Date: 08/14/2022 Goal Status: Active Interventions: Assess: immobility, friction, shearing, incontinence upon admission and as needed Assess offloading mechanisms upon admission and as needed Assess potential for pressure ulcer upon admission and as needed Provide education on pressure ulcers Notes: Wound/Skin Impairment Nursing Diagnoses: Impaired  tissue integrity Goals: Patient/caregiver will verbalize understanding of skin care regimen Date Initiated: 06/19/2022 Date Inactivated: 07/10/2022 Target Resolution Date: 06/19/2022 Goal Status: Met Ulcer/skin breakdown will have a volume reduction of 30% by week 4 Date Initiated: 06/19/2022 Date Inactivated: 07/28/2022 Target Resolution Date: 07/17/2022 Goal Status: Unmet Unmet Reason: infection Ulcer/skin breakdown will have a volume reduction of 50% by week 8 Date Initiated: 07/28/2022 Target Resolution Date: 08/14/2022 Goal Status: Active Interventions: Assess patient/caregiver ability to obtain necessary supplies Assess patient/caregiver ability to perform ulcer/skin care regimen upon admission and as needed Assess ulceration(s) every visit Provide education on ulcer and skin care Treatment Activities: Skin care regimen initiated : 06/19/2022 Topical wound management initiated : 06/19/2022 Notes: Electronic Signature(s) Signed: 11/13/2022 12:04:39 PM By: [name], BSN, RN, CWS, [name] RN, BSN Signed: 11/17/2022 4:19:16 PM By: Massie Kluver Entered By: Massie Kluver on 11/12/2022 15:38:03 -------------------------------------------------------------------------------- Pain Assessment Details Patient Name: Date of Service: [name], [name] [name] 11/12/2022 11:00 A M Medical Record Number: [id number] Patient Account Number: [id number] Date of Birth/Sex: Treating RN: 05-02-1946 (77 y.o. [name] Primary Care Tyreshia Ingman: Fulton Reek Other Clinician: Massie Kluver Referring Jaelah Hauth: Treating Jess Toney/Extender: RO BSO N, [place],  Hughes Better in Treatment: 20 Active Problems Location of Pain Severity and Description of Pain Patient Has Paino No Site Locations [name], [name] ([id number]) [id number] Page 7 of 10 Pain Management and Medication Current Pain Management: Electronic Signature(s) Signed: 11/12/2022 3:12:14 PM By: [name], BSN, RN, CWS,  [name] RN, BSN Signed: 11/17/2022 4:19:16 PM By: Massie Kluver Entered By: Massie Kluver on 11/12/2022 11:09:05 -------------------------------------------------------------------------------- Patient/Caregiver Education Details Patient Name: Date of Service: [name], [name] [name] 1/25/2024andnbsp11:00 A M Medical Record Number: [id number] Patient Account Number: [id number] Date of Birth/Gender: Treating RN: 1946-06-07 (77 y.o. [name] Primary Care Physician: Fulton Reek Other Clinician: Massie Kluver Referring Physician: Treating Physician/Extender: RO BSO Delane Ginger, [place] Ginny Forth in Treatment: 20 Education Assessment Education Provided To: Patient Education Topics Provided Wound/Skin Impairment: Handouts: Other: continue wound care as directed Methods: Explain/Verbal Responses: State content correctly Electronic Signature(s) Signed: 11/17/2022 4:19:16 PM By: Massie Kluver Entered By: Massie Kluver on 11/12/2022 11:29:36 [name] ([id number]) [id number] Page 8 of 10 -------------------------------------------------------------------------------- Wound Assessment Details Patient Name: Date of Service: [name], [name] [name] 11/12/2022 11:00 A M Medical Record Number: [id number] Patient Account Number: [id number] Date of Birth/Sex: Treating RN: [birth year] (77 y.o. [name], [name] Primary Care Elen Acero: Fulton Reek Other Clinician: Massie Kluver Referring Maigen Mozingo: Treating Jaquelinne Glendening/Extender: RO BSO N, MICHA EL Ginny Forth in Treatment: 20 Wound Status Wound Number: 10 Primary Diabetic Wound/Ulcer of the Lower Extremity Etiology: Wound Location: Left Calcaneus Secondary Pressure Ulcer Wounding Event: Gradually Appeared Etiology: Date Acquired: 06/05/2022 Wound Open Weeks Of Treatment: 20 Status: Clustered Wound: No Comorbid Cataracts, Arrhythmia, Coronary Artery Disease, Hypertension, History: Type II  Diabetes, Osteoarthritis, Neuropathy Photos Wound Measurements Length: (cm) 1 Width: (cm) 2.4 Depth: (cm) 0.2 Area: (cm) 1.885 Volume: (cm) 0.377 % Reduction in Area: -33.3% % Reduction in Volume: -33.2% Epithelialization: Small (1-33%) Wound Description Classification: Grade 1 Wound Margin: Flat and Intact Exudate Amount: Medium Exudate Type: Serosanguineous Exudate Color: red, brown Foul Odor After Cleansing: No Slough/Fibrino Yes Wound Bed Granulation Amount: Small (1-33%) Exposed Structure Granulation Quality: Pink Fascia Exposed: No Necrotic Amount: Large (67-100%) Fat Layer (Subcutaneous Tissue) Exposed: Yes Necrotic Quality: Adherent Slough Tendon Exposed: No Muscle Exposed: No Joint Exposed: No Bone Exposed: No Treatment Notes Wound #10 (Calcaneus) Wound Laterality: Left Cleanser Peri-Wound Care [name], [name] ([id number]) [id number] Page 9 of 10 Topical Primary Dressing IODOFLEX 0.9% Cadexomer Iodine Pad Discharge Instruction: Apply Iodoflex to wound bed only as directed. Secondary Dressing Zetuvit Plus 4x8 (in/in) Secured With Compression Wrap 3-LAYER WRAP - Profore Lite LF 3 Multilayer Compression Bandaging System Discharge Instruction: Apply 3 multi-layer wrap as prescribed. Compression Stockings Environmental education officer) Signed: 11/12/2022 3:12:14 PM By: [name], BSN, RN, CWS, [name] RN, BSN Signed: 11/17/2022 4:19:16 PM By: Massie Kluver Entered By: Massie Kluver on 11/12/2022 11:19:57 -------------------------------------------------------------------------------- Wound Assessment Details Patient Name: Date of Service: [name], [name] [name] 11/12/2022 11:00 A M Medical Record Number: [id number] Patient Account Number: [id number] Date of Birth/Sex: Treating RN: 1946-06-17 (77 y.o. [name] Primary Care Avis Mcmahill: Fulton Reek Other Clinician: Massie Kluver Referring Daenerys Buttram: Treating Nowell Sites/Extender: RO  BSO N, MICHA EL Ginny Forth in Treatment: 20 Wound Status Wound Number: 14 Primary Pressure Ulcer Etiology: Wound Location: Left T Fifth oe Wound Healed - Epithelialized Wounding Event: Shear/Friction Status: Date Acquired: 10/29/2022 Comorbid Cataracts, Arrhythmia, Coronary Artery Disease, Hypertension, Weeks Of Treatment: 2 History: Type II Diabetes, Osteoarthritis, Neuropathy Clustered Wound: No Photos Wound Measurements Length: (cm)  0 Width: (cm) 0 Depth: (cm) 0 Area: (cm) 0 Volume: (cm) 0 [name], [name] ([id number]) Wound Description Classification: Unstageable/Unclassified Exudate Amount: None Present Foul Odor After Cleansing: No Slough/Fibrino No % Reduction in Area: 100% % Reduction in Volume: 100% Epithelialization: Large (67-100%) [id number].pdf Page 10 of 10 Wound Bed Exposed Structure Fascia Exposed: Yes Fat Layer (Subcutaneous Tissue) Exposed: No Tendon Exposed: No Muscle Exposed: No Joint Exposed: No Bone Exposed: No Treatment Notes Wound #14 (Toe Fifth) Wound Laterality: Left Cleanser Peri-Wound Care Topical Primary Dressing Secondary Dressing Secured With Compression Wrap Compression Stockings Add-Ons Electronic Signature(s) Signed: 11/12/2022 3:12:14 PM By: [name], BSN, RN, CWS, [name] RN, BSN Signed: 11/17/2022 4:19:16 PM By: Massie Kluver Entered By: Massie Kluver on 11/12/2022 11:25:52 -------------------------------------------------------------------------------- Vitals Details Patient Name: Date of Service: [name], [name] [name] 11/12/2022 11:00 A M Medical Record Number: [id number] Patient Account Number: [id number] Date of Birth/Sex: Treating RN: 1946-08-21 (77 y.o. [name], [name] Primary Care Muhannad Bignell: Fulton Reek Other Clinician: Massie Kluver Referring Wentworth Edelen: Treating Terissa Haffey/Extender: RO BSO N, MICHA EL Ginny Forth in Treatment: 20 Vital Signs Time Taken:  11:06 Temperature (F): 98.5 Height (in): 70 Pulse (bpm): 72 Weight (lbs): 265 Respiratory Rate (breaths/min): 18 Body Mass Index (BMI): 38 Blood Pressure (mmHg): 147/70 Reference Range: 80 - 120 mg / dl Electronic Signature(s) Signed: 11/17/2022 4:19:16 PM By: Massie Kluver Entered By: Massie Kluver on 11/12/2022 11:08:59

## 2022-11-17 NOTE — Progress Notes (Signed)
[name] ([id number]) [id number] Page 1 of 10 Visit Report for 11/12/2022 Debridement Details Patient Name: Date of Service: [name], [name] [name] J. 11/12/2022 11:00 A M Medical Record Number: [id number] Patient Account Number: [id number] Date of Birth/Sex: Treating RN: [birth year] (77 y.o. [name], [name] Primary Care Provider: Fulton [name] Other Clinician: Massie [name] Referring Provider: Treating Provider/Extender: [name], [name] [name] in Treatment: 20 Debridement Performed for Assessment: Wound #10 Left Calcaneus Performed By: Physician [name] Dillon, MD Debridement Type: Debridement Severity of Tissue Pre Debridement: Fat layer exposed Level of Consciousness (Pre-procedure): Awake and Alert Pre-procedure Verification/Time Out Yes - 11:26 Taken: Start Time: 11:26 T Area Debrided (L x W): otal 1 (cm) x 2.4 (cm) = 2.4 (cm) Tissue and other material debrided: Viable, Non-Viable, Subcutaneous Level: Skin/Subcutaneous Tissue Debridement Description: Excisional Instrument: Curette Bleeding: Minimum Hemostasis Achieved: Pressure Response to Treatment: Procedure was tolerated well Level of Consciousness (Post- Awake and Alert procedure): Post Debridement Measurements of Total Wound Length: (cm) 1 Width: (cm) 2.4 Depth: (cm) 0.2 Volume: (cm) 0.377 Character of Wound/Ulcer Post Debridement: Stable Severity of Tissue Post Debridement: Fat layer exposed Post Procedure Diagnosis Same as Pre-procedure Electronic Signature(s) Signed: 11/12/2022 3:12:14 PM By: [name], BSN, RN, CWS, [name] RN, BSN Signed: 11/12/2022 4:38:56 PM By: [name] Ham MD Entered By: [name] on 11/12/2022 12:18:16 -------------------------------------------------------------------------------- HPI Details Patient Name: Date of Service: [name], [name] [name] J. 11/12/2022 11:00 A [name] ([id number])  [id number] Page 2 of 10 Medical Record Number: [id number] Patient Account Number: [id number] Date of Birth/Sex: Treating RN: [birth year] (77 y.o. [name] Primary Care Provider: Fulton [name] Other Clinician: Massie [name] Referring Provider: Treating Provider/Extender: [name] in Treatment: 20 History of Present Illness HPI Description: 09/24/2020 on evaluation today patient presents today for a heel ulcer that he tells me has been present for about 2 years. He has been seeing podiatry and they have been attempting to manage this including what sounds to be a total contact cast, Unna boot, and just standard dressings otherwise as well. Most recently has been using triple antibiotic ointment. With that being said unfortunately despite everything he really has not had any significant improvement. He tells me that he cannot even really remember exactly how this began but he presumed it may have rubbed on his shoes or something of that nature. 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 He tells me that this does seem to help. Fortunately there is no signs of active infection at this time. His most recent hemoglobin A1c was 8.1 though he knows this was this year he cannot tell me the exact time. His fluid pills currently to help with some of the lower extremity edema although he does obviously have signs of venous stasis/lymphedema. Currently there is no evidence of active infection. No fevers, chills, nausea, vomiting, or diarrhea. Patient has had fairly recent ABIs which were performed on 07/19/2020 and revealed that he has normal findings in both the  ankle and toe locations bilaterally. His ABI on the right was 1.09 on the left was 1.08 with a TBI on the right of 0.88 and on the left  of 0.94. Triphasic flow was noted throughout. 10/08/2020 on evaluation today patient appears to be doing pretty well in regard to his left heel currently in fact this is doing a great job and seems to be healing quite nicely. Unfortunately on his right leg he had a pile of wood that actually fell on him injuring his right leg this is somewhat erythematous has me concerned little bit about cellulitis though there is not really a good area to culture at this point. 10/24/2020 upon evaluation today patient appears to be doing well with regard to his heel ulcer. He is showing signs of improvement which is great news. His right leg is completely healed. Overall I feel like he is doing excellent and there is no signs of infection. 11/07/2020 upon evaluation today patient appears to be doing well with regard to his heel ulcer. He tells me that last week when he was unable to come in his wife actually thought that the wound was very close to closing if not closed. Then it began to "reopen again". I really feel like what may have happened as the collagen may have dried over the wound bed and that because that misunderstanding with thinking that the wound was healing. With that being said I did not see it last week I do not know that for certain. Either way I feel like he is doing great today I see no signs of infection at this point. 11/26/2020 upon inspection today patient appears to be doing decently well in regard to his heel ulcer. He has been tolerating the dressing changes without complication. Fortunately there is no sign of active infection at this time. No fevers, chills, nausea, vomiting, or diarrhea. 12/10/2020 upon evaluation today patient appears to be doing fairly well in regard to the wound on his heel as well as what appears to be a new wound of the left first  metatarsal head plantar aspect. This seems to be an area that was callus that has split as the patient tells me has been trying to walk on his toes more has probably where this came from. With that being said there does not appear to be signs of active infection which is great news. 12/17/2020 upon evaluation today patient actually appears to be making good progress currently. Fortunately there is no evidence of active infection at this time. Overall I feel like he is very close to complete closure. 12/26/2020 upon evaluation today patient appears to be doing excellent in regard to his wounds. In fact I am not certain that these are not even completely healed on initial inspection. Overall I am very pleased with where things stand today. Good news is that they are healed he is actually get ready to go out of town and that will be helpful as well as he will be a full part of the time. Readmission: 02/04/2021 upon evaluation today patient appears to be doing well at this point in regard to his left heel that I previously saw him for. Unfortunately he is having issues with his right lower extremity. He has significant wounds at this point he also has erythema noted there is definite signs of cellulitis which is unfortunate. With that being said I think we do need to address this sooner rather than later. The good news is he did have a nice trip to the beach. He tells me that he had no issues during that time. 4/27; patient on Bactrim. Culture showed methicillin sensitive staph aureus therefore the Bactrim  should be effective. 2 small areas on the right leg are healed the area on the mid aspect of the tibia almost 100% covered in a very adherent necrotic debris. We have been using silver alginate 02/20/2021 upon evaluation today patient appears to be doing well with regard to his wound. He is showing signs of improvement which is great news overall very pleased with where things stand today. No fevers, chills,  nausea, vomiting, or diarrhea. 02/27/2021 upon evaluation today patient appears to be doing well with regard to his leg ulcer. He is tolerating the dressing changes and overall appears to be doing quite excellent. I am extremely pleased with where things stand and overall I think patient is making great progress. There is no sign of active infection at this time which is also great news. 03/06/2021 upon evaluation today patient appears to be doing excellent in regard to his wounds. In fact he appears to be completely healed today based on what I am seeing. This is excellent news and overall I am extremely pleased with where he stands. Overall the patient is happy to hear this as well this has been quite sometime coming. Readmission: 04/01/2021 patient unfortunately returns for readmission today. He tells me that he has been wearing his compression socks on the right leg daily and he does not really know what is going on and why his legs are doing what they are doing. We did therefore go ahead and probe deeper into exactly what has been going on with him today. Subsequently the patient tells me that when he gets up and what we would call "first thing in the morning" are really 2 different things". He does not tend to sleep well so he tells me that he will often wake up at 3:00 in the morning. He will then potentially going into the living room to get in his chair where he may read a book for a little while and then potentially fall asleep back in his chair. He then subsequently wake up around 5:00 or so and then get up and in his words "putter around". This often will end with him proceeding at some point around 8 AM or 9 AM to putting on his compression socks. With that being said this means that anywhere from the 3:00 in the morning till roughly around 9:00 in the morning he has no compression on yet he is sitting in his recliner, walking around and up and about, and this is at least 5 to 6 hours of  noncompressed time. That may be our issue here but I did not realize until we discussed this further today. Fortunately there does not appear to be any signs of active infection at this time which is great news. With that being said he has multiple wounds of the bilateral lower extremities. 04/10/2021 upon evaluation today patient appears to be doing well with regard to his legs for the most part. He does look like he had some injury where his wrap slid down nonetheless he did some "doctoring on them". I do feel like most of the areas honestly have cleared back up which is great news. There does not appear to be any signs of active infection at this time which is also great news. No fevers, chills, nausea, vomiting, or diarrhea. 04/18/2021 upon evaluation today patient appears to be doing well with regard to his wounds in fact he appears to be completely healed which is great news. Fortunately there does not appear to be any  signs of active infection which is great news. No fevers, chills, nausea, vomiting, or diarrhea. [name], [name] ([id number]) [id number] Page 3 of 10 07/28/2021 This is a now 77 year old man we have had in this clinic for quite a bit of this year. 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 He wears compression stockings. 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 He comes in an old running shoes although he says he wears better shoes at home. His history is that he noticed a blister in the right posterior heel. This open. He has been applying Neosporin to it currently the wound measures 2 x 1.4 cm. 100% slough covered. Not really offloading this in any rigorous way. Past medical history is essentially unchanged he has paroxysmal atrial fibrillation on Coumadin type 2 diabetes with a recent hemoglobin A1c of 7 on metformin. ABI in  our clinic was 0.98 on the right 08/05/2021 upon evaluation today patient appears to be doing well with regard to his heel ulcer. Fortunately there does not appear to be any signs of active infection at this time. Overall I been very pleased with where things seem to be currently as far as the wound healing is concerned. Again this is first a lot of seeing him Dr. Dellia Nims readmitted him last week. Nonetheless I think we are definitely making some progress here. 08/11/2021 upon evaluation today patient's wound on the heel actually showing signs of excellent improvement. I am actually very pleased with where things stand currently. No fevers, chills, nausea, vomiting, or diarrhea. There does not appear to be any need for sharp debridement today either which is also great news. 08/25/2021 upon evaluation today patient appears to be doing well with regard to his heel ulcer. This is actually looking significantly improved compared to last time I saw him. Fortunately there does not appear to be any evidence of active infection at this time. No fevers, chills, nausea, vomiting, or diarrhea. 09/01/2021 upon evaluation today patient appears to be doing decently well in regard to his wounds. Fortunately there does not appear to be any signs of active infection at this time which is great news and overall very pleased in that regard. I do not see any evidence of active infection systemically which is great news as well. No fevers, chills, nausea, vomiting, or diarrhea. I think that the heel is making excellent progress. 09/15/2021 upon evaluation today patient's heel unfortunately is significantly worse compared to what it was previous. I do believe that at this time he would benefit from switching back to something a little bit more offloading from the cushion shoe he has right now this is more of a heel protector what I really need is a Prevalon offloading boot which I think is good to do a lot better for him  than just the small heel protector. 09/23/2021 upon evaluation today patient appears to be doing a little better in regards to last week's visit. Overall I think that he is making good progress which is great news and there does not appear to be any evidence of active infection at this time. No fevers, chills, nausea, vomiting, or diarrhea. 09/30/2021 upon evaluation patient's heel is actually showing signs of good improvement which is great news. Fortunately there does not appear to be any evidence of active infection locally nor systemically at this point. No fevers, chills, nausea, vomiting, or diarrhea. 10/07/2021 upon evaluation today patient  appears to be doing well currently in regard to his heel ulcer. He has been tolerating the dressing changes without complication. Fortunately I do not see any signs of active infection at this time which is great news. No fevers, chills, nausea, vomiting, or diarrhea. 12/27; wound is measuring smaller. We have been using silver alginate 10/28/2021 upon evaluation today patient appears to be doing well currently in regard to his heel ulcer. I am actually very pleased with where things stand and I think he is making excellent progress. Fortunately I do not see any signs of active infection locally nor systemically at this point which is great news. Nonetheless I do believe that the patient would benefit from a new offloading shoe and has been using it Velcro is no longer functioning properly and he tells me that he almost tripped and fell because of that today. Obviously I do not want him following that would be very bad. 11/11/2021 upon evaluation today patient appears to be doing excellent in regard to his wound. In fact this appears to be completely healed based on what I see currently. I do not see any signs of anything open or draining and I did double check just by clearing away some of the callus around the edges of the wound to ensure that there was  nothing still open or hiding underneath. Once this was cleared away it appeared that the patient was doing significantly better at this time which is great news and there was nothing actually open. Readmission: 06-19-2022 upon evaluation today patient appears to be doing well currently in regard to his heel ulcer all things considered. He unfortunately is having a bit of breakdown in general as far as the heel is concerned not nearly as bad as what we noted last time he was here in the clinic. The good news is I do think that this should hopefully heal much more rapidly than what we noted last time. His past medical history has not changed he is on Coumadin. 07-03-2022 upon evaluation today patient appears to be doing better in regard to his heel. We will start to see some improvement here which is good news. Fortunately I do not see any evidence of infection locally or systemically at this time which is great news. No fevers, chills, nausea, vomiting, or diarrhea. 07-10-2022 upon evaluation today patient appears to be doing well currently in regard to his wound from the callus standpoint around the edges. Unfortunately from an actual wound bed standpoint he has some deep tissue injury noted at this point. Again I am not sure exactly what is going on that is causing this pressure to the region but he is definitely gotten pressure that is occurring and causing this to not heal as effectively as what we would like to see. I discussed with him today that he may need to at night when he sleeping use a pillow up underneath his calf in order to prevent the heel from touching the bed at all. I also think that it may be beneficial for him to monitor throughout his day and make sure there is no other time when he is getting the pressure to the heel. 07-23-2022 upon evaluation today patient appears to be doing poorly currently in regard to his heel ulcer. He has been tolerating the dressing changes  without complication. Unfortunately I feel like he may be infected based on what I am seeing. 07-28-2022 I did review patient's culture results which showed positive for Proteus as  well as Staphylococcus both of which would be treated with the Bactrim DS that I gave him at the last visit. This is good news and hopefully means that we should be able to apply the cast today as long as the wound itself does not appear to be doing poorly. Patient's wound bed actually showed signs of doing quite well and I am very pleased with where things stand and I do not see any signs of worsening overall which is great news. 08-04-2022 upon evaluation today patient appears to be doing well currently in regard to his heel ulcer. I am actually very pleased with where things stand and I do think this is looking significantly better. With regard to his right shin that is also showing signs of improvement which is great news. 08-10-2022 upon evaluation today patient's wound on the heel actually appears to be doing significantly better. In regard to the right anterior shin this is showing signs of healing it might even be completely healed but I still get a monitor I cannot get anything to fill away but also could not see where there was anything actually draining at this point. I am tending to think healed but I want a monitor 1 more week before I call it for sure. 08-17-2022 upon evaluation today patient appears to be doing well currently in regard to his heel. Fortunately he is tolerating the dressing changes without complication. I do not see any signs of infection and overall I think he is making good progress here. We did get approval for the Apligraf but I think he had a $295 co-pay that would have to be paid per application. With that being said as good as he is doing right now I do not think that is necessary but is still an option depending on how things progress if we need to speed this up quite a bit. 08/24/2022;  this patient has a wound on his left heel in the setting of type 2 diabetes. We have been using Prisma. He has a heel offloading boot 08-31-2022 upon evaluation today patient appears to be doing poorly in regard to his heel ulcer which is still showing signs of bruising I am just not as happy as I was when I saw him 2 weeks ago with this. He saw Dr. Dellia Nims last week. Also did not look good at that point apparently. Nonetheless I think that the patient [name], [name] ([id number]) [id number] Page 4 of 10 is still continuing to have some counterpressure getting to the wound bed unfortunately. 09-07-2022 upon evaluation today patient appears to be doing well currently in regard to his heel ulcer. He has been tolerating the dressing changes without complication. Fortunately there does not appear to be any signs of active infection locally or systemically at this time. Fortunately I do not see any evidence of active infection at this time. 09-15-2022 upon evaluation today patient appears to be doing well currently in regard to his legs which in fact appear to be completely healed this is great news. With that being said his heel does have some erythema and warmth as well as drainage I am concerned about cellulitis at this location. 09-21-2022 upon evaluation today patient's wound is actually showing signs of being a little bit smaller but still we are not doing nearly as well as what I would like to see. Fortunately I do not see any signs of infection at this time which is great news and overall I am extremely pleased in  that regard. With that being said I do think that he needs to continue to elevate his leg although the edema is under great control with a compression wrap I think switching to a different dressing topically may be beneficial. My suggestion is to switch him over to a Iodoflex dressing. 09-28-2022 upon evaluation today patient appears to be doing well with regard  to his heel ulcer. This is actually showing signs of improvement which is great news and overall I am extremely pleased with where we stand today. 10-06-2022 upon evaluation today patient actually appears to be making excellent progress at this point. Fortunately there does not appear to be any signs of active infection locally nor systemically which is great news and overall I am extremely pleased with where we stand. I do feel like he is actually making some pretty good progress here as we switch to the wrap along with the Iodoflex. 12/26; patient has a wound on the tip of his left heel. This is not a weightbearing surface. He is using a heel offloading boot and carefully offloading this at other times during the day. He is using Iodoflex and Zetuvitunder 3 layer compression 10-22-2022 upon evaluation today patient appears to be doing well currently in regard to his wound. He has been tolerating the dressing changes without complication. Fortunately there does not appear to be any signs of active infection locally nor systemically at this time. No fevers, chills, nausea, vomiting, or diarrhea. 08-30-2023 upon evaluation today patient appears to be doing well currently in regard to his heel ulcer which is showing signs of good improvement. Fortunately I do not see any evidence of infection locally nor systemically which is great news and overall I am extremely pleased with where we stand. No fevers, chills, nausea, vomiting, or diarrhea. Unfortunately he does have a wound on the side of his fifth toe where I think this did rub on the shoe. 11-05-2022 upon evaluation today patient still still showing some signs of pressure at this point and there may have even been some fluid collection at this time. Fortunately I do not see any evidence of active infection locally nor systemically which is great news and overall I am extremely pleased with where we stand. No fevers, chills, nausea, vomiting, or  diarrhea. 1/25; the patient's area on the dorsal aspect of the left fifth toe is healed. We have been using Iodoflex to the area on the left heel. The lateral wound is slightly down in length. Electronic Signature(s) Signed: 11/12/2022 4:38:56 PM By: [name] Ham MD Entered By: [name] on 11/12/2022 12:13:41 -------------------------------------------------------------------------------- Physical Exam Details Patient Name: Date of Service: [name], [name] [name] J. 11/12/2022 11:00 A M Medical Record Number: [id number] Patient Account Number: [id number] Date of Birth/Sex: Treating RN: 07-01-1946 (77 y.o. [name] Primary Care Provider: Fulton [name] Other Clinician: Massie [name] Referring Provider: Treating Provider/Extender: [name] in Treatment: 52 Constitutional Patient is hypertensive.. Pulse regular and within target range for patient.Marland Kitchen Respirations regular, non-labored and within target range.. Temperature is normal and within the target range for the patient.Marland Kitchen appears in no distress. Notes Wound exam; the left fifth toe is healed on the dorsal aspect. The area on the medial aspect of the left calcaneus requires debridement with a #5 curette this cleans up quite nicely. There is no evidence of surrounding infection this does not probe to bone. Electronic Signature(s) Signed: 11/12/2022 4:38:56 PM By: [name] Ham MD Entered By: Dellia Nims,  Legrand Como on 11/12/2022 12:15:19 MUJAHID, JALOMO ([id number]) [id number] Page 5 of 10 -------------------------------------------------------------------------------- Physician Orders Details Patient Name: Date of Service: [name], [name] [facility] J. 11/12/2022 11:00 A M Medical Record Number: [id number] Patient Account Number: [id number] Date of Birth/Sex: Treating RN: January 16, 1946 (77 y.o. [name] Primary Care Provider: Fulton [name] Other Clinician: Massie [name] Referring Provider: Treating Provider/Extender: [name] in Treatment: 20 Verbal / Phone Orders: No Diagnosis Coding Follow-up Appointments Wound #10 Left Calcaneus Return Appointment in 1 week. Bathing/ Shower/ Hygiene May shower; gently cleanse wound with antibacterial soap, rinse and pat dry prior to dressing wounds Edema Control - Lymphedema / Segmental Compressive Device / Other 3 Layer Compression System for Lymphedema. - left lower leg Non-Wound Condition dditional non-wound orders/instructions: - Apply 40% Urea cream to dry cracked skin on feet. A Off-Loading Open toe surgical shoe with peg assist. Wound Treatment Wound #10 - Calcaneus Wound Laterality: Left Prim Dressing: IODOFLEX 0.9% Cadexomer Iodine Pad 3 x Per Week/30 Days ary Discharge Instructions: Apply Iodoflex to wound bed only as directed. Secondary Dressing: Zetuvit Plus 4x8 (in/in) 3 x Per Week/30 Days Compression Wrap: 3-LAYER WRAP - Profore Lite LF 3 Multilayer Compression Bandaging System 3 x Per Week/30 Days Discharge Instructions: Apply 3 multi-layer wrap as prescribed. Electronic Signature(s) Signed: 11/12/2022 4:38:56 PM By: [name] Ham MD Signed: 11/17/2022 4:19:16 PM By: [name] Entered By: [name] on 11/12/2022 11:28:47 -------------------------------------------------------------------------------- Problem List Details Patient Name: Date of Service: [name], [name] [name] J. 11/12/2022 11:00 A M Medical Record Number: [id number] Patient Account Number: [id number] Date of Birth/Sex: Treating RN: 1946-03-15 (77 y.o. Wane, [place], [place] ([id number]) [id number] Page 6 of 10 Primary Care Provider: Fulton [name] Other Clinician: Massie [name] Referring Provider: Treating Provider/Extender: [name] in Treatment: 20 Active Problems ICD-10 Encounter Code Description Active  Date MDM Diagnosis E11.621 Type 2 diabetes mellitus with foot ulcer 06/19/2022 No Yes 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sed 06/19/2022 No Yes E11.42 Type 2 diabetes mellitus with diabetic polyneuropathy 06/19/2022 No Yes Inactive Problems Resolved Problems Electronic Signature(s) Signed: 11/12/2022 4:38:56 PM By: [name] Ham MD Entered By: [name] on 11/12/2022 12:12:41 -------------------------------------------------------------------------------- Progress Note Details Patient Name: Date of Service: [name], [name] [name] J. 11/12/2022 11:00 A M Medical Record Number: [id number] Patient Account Number: [id number] Date of Birth/Sex: Treating RN: Jul 27, 1946 (77 y.o. [name] Primary Care Provider: Fulton [name] Other Clinician: Massie [name] Referring Provider: Treating Provider/Extender: [name], [place] [name] [name] in Treatment: 20 Subjective History of Present Illness (HPI) 09/24/2020 on evaluation today patient presents today for a heel ulcer that he tells me has been present for about 2 years. He has been seeing podiatry and they have been attempting to manage this including what sounds to be a total contact cast, Unna boot, and just standard dressings otherwise as well. Most recently has been using triple antibiotic ointment. With that being said unfortunately despite everything he really has not had any significant improvement. He tells me that he cannot even really remember exactly how this began but he presumed it may have rubbed on his shoes or something of that nature. 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 He tells me that this  does seem to help. Fortunately there is no signs of active infection at this time. His most recent hemoglobin A1c was 8.1 though he knows this was this year he cannot tell me the exact time. His fluid pills currently to help with some of the lower extremity edema although he does obviously have signs of venous stasis/lymphedema. Currently there is no evidence of active infection. No fevers, chills, nausea, vomiting, or diarrhea. Patient has had fairly recent ABIs which were performed on 07/19/2020 and revealed that he has normal findings in both the ankle and toe locations bilaterally. His ABI on the right was 1.09 on the left was 1.08 with a TBI on the right of 0.88 and on the left of 0.94. Triphasic flow was noted throughout. 10/08/2020 on evaluation today patient appears to be doing pretty well in regard to his left heel currently in fact this is doing a great job and seems to be healing quite nicely. Unfortunately on his right leg he had a pile of wood that actually fell on him injuring his right leg this is somewhat erythematous has me concerned little bit about cellulitis though there is not really a good area to culture at this point. 10/24/2020 upon evaluation today patient appears to be doing well with regard to his heel ulcer. He is showing signs of improvement which is great news. His right leg is completely healed. Overall I feel like he is doing excellent and there is no signs of infection. 11/07/2020 upon evaluation today patient appears to be doing well with regard to his heel ulcer. He tells me that last week when he was unable to come in his wife actually thought that the wound was very close to closing if not closed. Then it began to "reopen again". I really feel like what may have happened as the YARIEL, FERRARIS ([id number]) [id number] Page 7 of 10 collagen may have dried over the wound bed and that because that misunderstanding with thinking that the wound  was healing. With that being said I did not see it last week I do not know that for certain. Either way I feel like he is doing great today I see no signs of infection at this point. 11/26/2020 upon inspection today patient appears to be doing decently well in regard to his heel ulcer. He has been tolerating the dressing changes without complication. Fortunately there is no sign of active infection at this time. No fevers, chills, nausea, vomiting, or diarrhea. 12/10/2020 upon evaluation today patient appears to be doing fairly well in regard to the wound on his heel as well as what appears to be a new wound of the left first metatarsal head plantar aspect. This seems to be an area that was callus that has split as the patient tells me has been trying to walk on his toes more has probably where this came from. With that being said there does not appear to be signs of active infection which is great news. 12/17/2020 upon evaluation today patient actually appears to be making good progress currently. Fortunately there is no evidence of active infection at this time. Overall I feel like he is very close to complete closure. 12/26/2020 upon evaluation today patient appears to be doing excellent in regard to his wounds. In fact I am not certain that these are not even  completely healed on initial inspection. Overall I am very pleased with where things stand today. Good news is that they are healed he is actually get ready to go out of town and that will be helpful as well as he will be a full part of the time. Readmission: 02/04/2021 upon evaluation today patient appears to be doing well at this point in regard to his left heel that I previously saw him for. Unfortunately he is having issues with his right lower extremity. He has significant wounds at this point he also has erythema noted there is definite signs of cellulitis which is unfortunate. With that being said I think we do need to address this sooner  rather than later. The good news is he did have a nice trip to the beach. He tells me that he had no issues during that time. 4/27; patient on Bactrim. Culture showed methicillin sensitive staph aureus therefore the Bactrim should be effective. 2 small areas on the right leg are healed the area on the mid aspect of the tibia almost 100% covered in a very adherent necrotic debris. We have been using silver alginate 02/20/2021 upon evaluation today patient appears to be doing well with regard to his wound. He is showing signs of improvement which is great news overall very pleased with where things stand today. No fevers, chills, nausea, vomiting, or diarrhea. 02/27/2021 upon evaluation today patient appears to be doing well with regard to his leg ulcer. He is tolerating the dressing changes and overall appears to be doing quite excellent. I am extremely pleased with where things stand and overall I think patient is making great progress. There is no sign of active infection at this time which is also great news. 03/06/2021 upon evaluation today patient appears to be doing excellent in regard to his wounds. In fact he appears to be completely healed today based on what I am seeing. This is excellent news and overall I am extremely pleased with where he stands. Overall the patient is happy to hear this as well this has been quite sometime coming. Readmission: 04/01/2021 patient unfortunately returns for readmission today. He tells me that he has been wearing his compression socks on the right leg daily and he does not really know what is going on and why his legs are doing what they are doing. We did therefore go ahead and probe deeper into exactly what has been going on with him today. Subsequently the patient tells me that when he gets up and what we would call "first thing in the morning" are really 2 different things". He does not tend to sleep well so he tells me that he will often wake up at 3:00 in  the morning. He will then potentially going into the living room to get in his chair where he may read a book for a little while and then potentially fall asleep back in his chair. He then subsequently wake up around 5:00 or so and then get up and in his words "putter around". This often will end with him proceeding at some point around 8 AM or 9 AM to putting on his compression socks. With that being said this means that anywhere from the 3:00 in the morning till roughly around 9:00 in the morning he has no compression on yet he is sitting in his recliner, walking around and up and about, and this is at least 5 to 6 hours of noncompressed time. That may be our issue here but  I did not realize until we discussed this further today. Fortunately there does not appear to be any signs of active infection at this time which is great news. With that being said he has multiple wounds of the bilateral lower extremities. 04/10/2021 upon evaluation today patient appears to be doing well with regard to his legs for the most part. He does look like he had some injury where his wrap slid down nonetheless he did some "doctoring on them". I do feel like most of the areas honestly have cleared back up which is great news. There does not appear to be any signs of active infection at this time which is also great news. No fevers, chills, nausea, vomiting, or diarrhea. 04/18/2021 upon evaluation today patient appears to be doing well with regard to his wounds in fact he appears to be completely healed which is great news. Fortunately there does not appear to be any signs of active infection which is great news. No fevers, chills, nausea, vomiting, or diarrhea. READMISSION 07/28/2021 This is a now 77 year old man we have had in this clinic for quite a bit of this year. 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 He wears compression stockings. 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 He comes in an old running shoes although he says he wears better shoes at home. His history is that he noticed a blister in the right posterior heel. This open. He has been applying Neosporin to it currently the wound measures 2 x 1.4 cm. 100% slough covered. Not really offloading this in any rigorous way. Past medical history is essentially unchanged he has paroxysmal atrial fibrillation on Coumadin type 2 diabetes with a recent hemoglobin A1c of 7 on metformin. ABI in our clinic was 0.98 on the right 08/05/2021 upon evaluation today patient appears to be doing well with regard to his heel ulcer. Fortunately there does not appear to be any signs of active infection at this time. Overall I been very pleased with where things seem to be currently as far as the wound healing is concerned. Again this is first a lot of seeing him Dr. Dellia Nims readmitted him last week. Nonetheless I think we are definitely making some progress here. 08/11/2021 upon evaluation today patient's wound on the heel actually showing signs of excellent improvement. I am actually very pleased with where things stand currently. No fevers, chills, nausea, vomiting, or diarrhea. There does not appear to be any need for sharp debridement today either which is also great news. 08/25/2021 upon evaluation today patient appears to be doing well with regard to his heel ulcer. This is actually looking significantly improved compared to last time I saw him. Fortunately there does not appear to be any evidence of active infection at this time. No fevers, chills, nausea, vomiting, or diarrhea. 09/01/2021 upon evaluation today patient appears to be doing decently well in regard to his wounds. Fortunately there does not appear to be any signs of active infection at this time which is great news and overall very pleased in that regard. I do not see any evidence of active  infection systemically which is great news as well. No fevers, chills, nausea, vomiting, or diarrhea. I think that the heel is making excellent progress. 09/15/2021 upon evaluation today patient's heel unfortunately is significantly worse compared to what it was previous. I do believe that at this time he would benefit from  switching back to something a little bit more offloading from the cushion shoe he has right now this is more of a heel protector what I really need is a Prevalon offloading boot which I think is good to do a lot better for him than just the small heel protector. [name], [name] ([id number]) [id number] Page 8 of 10 09/23/2021 upon evaluation today patient appears to be doing a little better in regards to last week's visit. Overall I think that he is making good progress which is great news and there does not appear to be any evidence of active infection at this time. No fevers, chills, nausea, vomiting, or diarrhea. 09/30/2021 upon evaluation patient's heel is actually showing signs of good improvement which is great news. Fortunately there does not appear to be any evidence of active infection locally nor systemically at this point. No fevers, chills, nausea, vomiting, or diarrhea. 10/07/2021 upon evaluation today patient appears to be doing well currently in regard to his heel ulcer. He has been tolerating the dressing changes without complication. Fortunately I do not see any signs of active infection at this time which is great news. No fevers, chills, nausea, vomiting, or diarrhea. 12/27; wound is measuring smaller. We have been using silver alginate 10/28/2021 upon evaluation today patient appears to be doing well currently in regard to his heel ulcer. I am actually very pleased with where things stand and I think he is making excellent progress. Fortunately I do not see any signs of active infection locally nor systemically at this point which is  great news. Nonetheless I do believe that the patient would benefit from a new offloading shoe and has been using it Velcro is no longer functioning properly and he tells me that he almost tripped and fell because of that today. Obviously I do not want him following that would be very bad. 11/11/2021 upon evaluation today patient appears to be doing excellent in regard to his wound. In fact this appears to be completely healed based on what I see currently. I do not see any signs of anything open or draining and I did double check just by clearing away some of the callus around the edges of the wound to ensure that there was nothing still open or hiding underneath. Once this was cleared away it appeared that the patient was doing significantly better at this time which is great news and there was nothing actually open. Readmission: 06-19-2022 upon evaluation today patient appears to be doing well currently in regard to his heel ulcer all things considered. He unfortunately is having a bit of breakdown in general as far as the heel is concerned not nearly as bad as what we noted last time he was here in the clinic. The good news is I do think that this should hopefully heal much more rapidly than what we noted last time. His past medical history has not changed he is on Coumadin. 07-03-2022 upon evaluation today patient appears to be doing better in regard to his heel. We will start to see some improvement here which is good news. Fortunately I do not see any evidence of infection locally or systemically at this time which is great news. No fevers, chills, nausea, vomiting, or diarrhea. 07-10-2022 upon evaluation today patient appears to be doing well currently in regard to his wound from the callus standpoint around the edges. Unfortunately from an actual wound bed standpoint he has some deep tissue injury noted at this point. Again I am  not sure exactly what is going on that is causing this pressure to  the region but he is definitely gotten pressure that is occurring and causing this to not heal as effectively as what we would like to see. I discussed with him today that he may need to at night when he sleeping use a pillow up underneath his calf in order to prevent the heel from touching the bed at all. I also think that it may be beneficial for him to monitor throughout his day and make sure there is no other time when he is getting the pressure to the heel. 07-23-2022 upon evaluation today patient appears to be doing poorly currently in regard to his heel ulcer. He has been tolerating the dressing changes without complication. Unfortunately I feel like he may be infected based on what I am seeing. 07-28-2022 I did review patient's culture results which showed positive for Proteus as well as Staphylococcus both of which would be treated with the Bactrim DS that I gave him at the last visit. This is good news and hopefully means that we should be able to apply the cast today as long as the wound itself does not appear to be doing poorly. Patient's wound bed actually showed signs of doing quite well and I am very pleased with where things stand and I do not see any signs of worsening overall which is great news. 08-04-2022 upon evaluation today patient appears to be doing well currently in regard to his heel ulcer. I am actually very pleased with where things stand and I do think this is looking significantly better. With regard to his right shin that is also showing signs of improvement which is great news. 08-10-2022 upon evaluation today patient's wound on the heel actually appears to be doing significantly better. In regard to the right anterior shin this is showing signs of healing it might even be completely healed but I still get a monitor I cannot get anything to fill away but also could not see where there was anything actually draining at this point. I am tending to think healed but I want a  monitor 1 more week before I call it for sure. 08-17-2022 upon evaluation today patient appears to be doing well currently in regard to his heel. Fortunately he is tolerating the dressing changes without complication. I do not see any signs of infection and overall I think he is making good progress here. We did get approval for the Apligraf but I think he had a $295 co-pay that would have to be paid per application. With that being said as good as he is doing right now I do not think that is necessary but is still an option depending on how things progress if we need to speed this up quite a bit. 08/24/2022; this patient has a wound on his left heel in the setting of type 2 diabetes. We have been using Prisma. He has a heel offloading boot 08-31-2022 upon evaluation today patient appears to be doing poorly in regard to his heel ulcer which is still showing signs of bruising I am just not as happy as I was when I saw him 2 weeks ago with this. He saw Dr. Dellia Nims last week. Also did not look good at that point apparently. Nonetheless I think that the patient is still continuing to have some counterpressure getting to the wound bed unfortunately. 09-07-2022 upon evaluation today patient appears to be doing well currently in regard to  his heel ulcer. He has been tolerating the dressing changes without complication. Fortunately there does not appear to be any signs of active infection locally or systemically at this time. Fortunately I do not see any evidence of active infection at this time. 09-15-2022 upon evaluation today patient appears to be doing well currently in regard to his legs which in fact appear to be completely healed this is great news. With that being said his heel does have some erythema and warmth as well as drainage I am concerned about cellulitis at this location. 09-21-2022 upon evaluation today patient's wound is actually showing signs of being a little bit smaller but still we are not  doing nearly as well as what I would like to see. Fortunately I do not see any signs of infection at this time which is great news and overall I am extremely pleased in that regard. With that being said I do think that he needs to continue to elevate his leg although the edema is under great control with a compression wrap I think switching to a different dressing topically may be beneficial. My suggestion is to switch him over to a Iodoflex dressing. 09-28-2022 upon evaluation today patient appears to be doing well with regard to his heel ulcer. This is actually showing signs of improvement which is great news and overall I am extremely pleased with where we stand today. 10-06-2022 upon evaluation today patient actually appears to be making excellent progress at this point. Fortunately there does not appear to be any signs of active infection locally nor systemically which is great news and overall I am extremely pleased with where we stand. I do feel like he is actually making some pretty good progress here as we switch to the wrap along with the Iodoflex. 12/26; patient has a wound on the tip of his left heel. This is not a weightbearing surface. He is using a heel offloading boot and carefully offloading this at other times during the day. He is using Iodoflex and Zetuvitunder 3 layer compression 10-22-2022 upon evaluation today patient appears to be doing well currently in regard to his wound. He has been tolerating the dressing changes without complication. Fortunately there does not appear to be any signs of active infection locally nor systemically at this time. No fevers, chills, nausea, vomiting, or diarrhea. 08-30-2023 upon evaluation today patient appears to be doing well currently in regard to his heel ulcer which is showing signs of good improvement. Fortunately I do not see any evidence of infection locally nor systemically which is great news and overall I am extremely pleased with where  we stand. No fevers, chills, nausea, vomiting, or diarrhea. Unfortunately he does have a wound on the side of his fifth toe where I think this did rub on the shoe. 11-05-2022 upon evaluation today patient still still showing some signs of pressure at this point and there may have even been some fluid collection at this [name], [name] ([id number]) [id number] Page 9 of 10 time. Fortunately I do not see any evidence of active infection locally nor systemically which is great news and overall I am extremely pleased with where we stand. No fevers, chills, nausea, vomiting, or diarrhea. 1/25; the patient's area on the dorsal aspect of the left fifth toe is healed. We have been using Iodoflex to the area on the left heel. The lateral wound is slightly down in length. Objective Constitutional Patient is hypertensive.. Pulse regular and within target range for patient.Marland Kitchen Respirations  regular, non-labored and within target range.. Temperature is normal and within the target range for the patient.Marland Kitchen appears in no distress. Vitals Time Taken: 11:06 AM, Height: 70 in, Weight: 265 lbs, BMI: 38, Temperature: 98.5 F, Pulse: 72 bpm, Respiratory Rate: 18 breaths/min, Blood Pressure: 147/70 mmHg. General Notes: Wound exam; the left fifth toe is healed on the dorsal aspect. The area on the medial aspect of the left calcaneus requires debridement with a #5 curette this cleans up quite nicely. There is no evidence of surrounding infection this does not probe to bone. Integumentary (Hair, Skin) Wound #10 status is Open. Original cause of wound was Gradually Appeared. The date acquired was: 06/05/2022. The wound has been in treatment 20 weeks. The wound is located on the Left Calcaneus. The wound measures 1cm length x 2.4cm width x 0.2cm depth; 1.885cm^2 area and 0.377cm^3 volume. There is Fat Layer (Subcutaneous Tissue) exposed. There is a medium amount of serosanguineous drainage noted. The  wound margin is flat and intact. There is small (1-33%) pink granulation within the wound bed. There is a large (67-100%) amount of necrotic tissue within the wound bed including Adherent Slough. Wound #14 status is Healed - Epithelialized. Original cause of wound was Shear/Friction. The date acquired was: 10/29/2022. The wound has been in treatment 2 weeks. The wound is located on the Left T Fifth. The wound measures 0cm length x 0cm width x 0cm depth; 0cm^2 area and 0cm^3 volume. There is fascia oe exposed. There is a none present amount of drainage noted. 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 There was a Excisional Skin/Subcutaneous Tissue Debridement with a total area of 2.4 sq cm performed by [name] Dillon, MD. With the following instrument(s): Curette to remove Viable and Non-Viable tissue/material. Material removed includes Subcutaneous Tissue. A time out was conducted at 11:26, prior to the start of the procedure. A Minimum amount of bleeding was controlled with Pressure. The procedure was tolerated well. Post Debridement Measurements: 1cm length x 2.4cm width x 0.2cm depth; 0.377cm^3 volume. Character of Wound/Ulcer Post Debridement is stable. Severity of Tissue Post Debridement is: Fat layer exposed. 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 There was a Three Layer Compression Therapy Procedure with a pre-treatment ABI of 1 by [name]. Post procedure Diagnosis Wound #10: Same as  Pre-Procedure Plan Follow-up Appointments: Wound #10 Left Calcaneus: Return Appointment in 1 week. Bathing/ Shower/ Hygiene: May shower; gently cleanse wound with antibacterial soap, rinse and pat dry prior to dressing wounds Edema Control - Lymphedema / Segmental Compressive Device / Other: 3 Layer Compression System for Lymphedema. - left lower leg Non-Wound Condition: Additional non-wound orders/instructions: - Apply 40% Urea cream to dry cracked skin on feet. CORIE, VAVRA ([id number]) [id number] Page 10 of 10 Off-Loading: Open toe surgical shoe with peg assist. WOUND #10: - Calcaneus Wound Laterality: Left Prim Dressing: IODOFLEX 0.9% Cadexomer Iodine Pad 3 x Per Week/30 Days ary Discharge Instructions: Apply Iodoflex to wound bed only as directed. Secondary Dressing: Zetuvit Plus 4x8 (in/in) 3 x Per Week/30 Days Com pression Wrap: 3-LAYER  WRAP - Profore Lite LF 3 Multilayer Compression Bandaging System 3 x Per Week/30 Days Discharge Instructions: Apply 3 multi-layer wrap as prescribed. 1. I continued the Iodoflex to the left heel which is slightly down in length. We have him in 3 layer compression I continued this. 2. The area on the left fifth toe is healed. He thinks this may have been trauma from a offloading boot that he was wearing. 3. He is likely to require ongoing debridement until we can get the surface of the wound a little healthier and then probably change the primary dressing at that point. Until then Iodoflex and possibly continued debridement 4. He tells me he is carefully offloading thiss Electronic Signature(s) Signed: 11/12/2022 4:38:56 PM By: [name] Ham MD Entered By: [name] on 11/12/2022 12:17:49 -------------------------------------------------------------------------------- SuperBill Details Patient Name: Date of Service: [name], [name] [name] J. 11/12/2022 Medical Record Number: [id number] Patient Account Number:  [id number] Date of Birth/Sex: Treating RN: 25-Nov-1945 (77 y.o. [name], [name] Primary Care Provider: Fulton [name] Other Clinician: Massie [name] Referring Provider: Treating Provider/Extender: [name], [name] [name] in Treatment: 20 Diagnosis Coding ICD-10 Codes Code Description E11.621 Type 2 diabetes mellitus with foot ulcer 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sed E11.42 Type 2 diabetes mellitus with diabetic polyneuropathy Facility Procedures : CPT4 Code: [id number] Description: 13887 - DEB SUBQ TISSUE 20 SQ CM/< ICD-10 Diagnosis Description 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sed E11.621 Type 2 diabetes mellitus with foot ulcer Modifier: Quantity: 1 Physician Procedures : CPT4 Code Description Modifier [id number] - WC PHYS SUBQ TISS 20 SQ CM ICD-10 Diagnosis Description 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sed E11.621 Type 2 diabetes mellitus with foot ulcer Quantity: 1 Electronic Signature(s) Signed: 11/12/2022 4:38:56 PM By: [name] Ham MD Entered By: [name] on 11/12/2022 12:18:00

## 2022-11-19 ENCOUNTER — Encounter: Payer: Medicare HMO | Attending: Physician Assistant | Admitting: Physician Assistant

## 2022-11-19 DIAGNOSIS — L97522 Non-pressure chronic ulcer of other part of left foot with fat layer exposed: Secondary | ICD-10-CM | POA: Diagnosis not present

## 2022-11-19 DIAGNOSIS — E11621 Type 2 diabetes mellitus with foot ulcer: Secondary | ICD-10-CM | POA: Insufficient documentation

## 2022-11-19 DIAGNOSIS — E1142 Type 2 diabetes mellitus with diabetic polyneuropathy: Secondary | ICD-10-CM | POA: Insufficient documentation

## 2022-11-19 NOTE — Progress Notes (Addendum)
LAKSH, HINNERS ([id number]) [id number] Page 1 of 11 Visit Report for 11/19/2022 Chief Complaint Document Details Patient Name: Date of Service: [name], [name] [name] 11/19/2022 1:00 PM Medical Record Number: [id number] Patient Account Number: [id number] Date of Birth/Sex: Treating RN: 1946/04/23 (77 y.o. [name] Primary Care Provider: Fulton Reek Other Clinician: Massie Kluver Referring Provider: Treating Provider/Extender: Maurilio Lovely Weeks in Treatment: 21 Information Obtained from: Patient Chief Complaint Left heel ulcer Electronic Signature(s) Signed: 11/19/2022 1:10:24 PM By: Worthy Keeler PA-C Entered By: Worthy Keeler on 11/19/2022 13:10:24 -------------------------------------------------------------------------------- Debridement Details Patient Name: Date of Service: [name], [name] [name] 11/19/2022 1:00 PM Medical Record Number: [id number] Patient Account Number: [id number] Date of Birth/Sex: Treating RN: 1946-09-02 (77 y.o. [name] Primary Care Provider: Fulton Reek Other Clinician: Massie Kluver Referring Provider: Treating Provider/Extender: [name] in Treatment: 21 Debridement Performed for Assessment: Wound #10 Left Calcaneus Performed By: Physician Tommie Sams., PA-C Debridement Type: Debridement Severity of Tissue Pre Debridement: Fat layer exposed Level of Consciousness (Pre-procedure): Awake and Alert Pre-procedure Verification/Time Out Yes - 13:31 Taken: Start Time: 13:31 T Area Debrided (L x W): otal 1 (cm) x 2 (cm) = 2 (cm) Tissue and other material debrided: Viable, Non-Viable, Callus, Slough, Subcutaneous, Slough Level: Skin/Subcutaneous Tissue Debridement Description: Excisional Instrument: Curette Bleeding: Minimum Hemostasis Achieved: Pressure Response to Treatment: Procedure was tolerated well Level of Consciousness (Post- Awake and  Alert procedure): [name] ([id number]) [id number] Page 2 of 11 Post Debridement Measurements of Total Wound Length: (cm) 1 Width: (cm) 2 Depth: (cm) 0.2 Volume: (cm) 0.314 Character of Wound/Ulcer Post Debridement: Stable Severity of Tissue Post Debridement: Fat layer exposed Post Procedure Diagnosis Same as Pre-procedure Electronic Signature(s) Signed: 11/19/2022 4:41:53 PM By: Worthy Keeler PA-C Signed: 11/20/2022 4:09:44 PM By: [name] BSN, RN, CWS, [name] RN, BSN Signed: 11/24/2022 4:49:02 PM By: Massie Kluver Entered By: Massie Kluver on 11/19/2022 13:33:47 -------------------------------------------------------------------------------- HPI Details Patient Name: Date of Service: [name], [name] [name] 11/19/2022 1:00 PM Medical Record Number: [id number] Patient Account Number: [id number] Date of Birth/Sex: Treating RN: [birth year] (77 y.o. [name] Primary Care Provider: Fulton Reek Other Clinician: Massie Kluver Referring Provider: Treating Provider/Extender: [name] in Treatment: 21 History of Present Illness HPI Description: 09/24/2020 on evaluation today patient presents today for a heel ulcer that he tells me has been present for about 2 years. He has been seeing podiatry and they have been attempting to manage this including what sounds to be a total contact cast, Unna boot, and just standard dressings otherwise as well. Most recently has been using triple antibiotic ointment. With that being said unfortunately despite everything he really has not had any significant improvement. He tells me that he cannot even really remember exactly how this began but he presumed it may have rubbed on his shoes or something of that nature. 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 He tells me that this does seem to help. Fortunately there is no signs of active infection at this time. His most recent hemoglobin A1c was 8.1 though he knows this was this year he cannot tell me the exact time.  His fluid pills currently to help with some of the lower extremity edema although he does obviously have signs of venous stasis/lymphedema. Currently there is no evidence of active infection. No fevers, chills, nausea, vomiting, or diarrhea. Patient has had fairly recent ABIs which were performed on 07/19/2020 and revealed that he has normal findings in both the ankle and toe locations bilaterally. His ABI on the right was 1.09 on the left was 1.08 with a TBI on the right of 0.88 and on the left of 0.94. Triphasic flow was noted throughout. 10/08/2020 on evaluation today patient appears to be doing pretty well in regard to his left heel currently in fact this is doing a great job and seems to be healing quite nicely. Unfortunately on his right leg he had a pile of wood that actually fell on him injuring his right leg this is somewhat erythematous has me concerned little bit about cellulitis though there is not really a good area to culture at this point. 10/24/2020 upon evaluation today patient appears to be doing well with regard to his heel ulcer. He is showing signs of improvement which is great news. His right leg is completely healed. Overall I feel like he is doing excellent and there is no signs of infection. 11/07/2020 upon evaluation today patient appears to be doing well with regard to his heel ulcer. He tells me that last week when he was unable to come in his wife actually thought that the wound was very close to closing if not closed. Then it began to "reopen again". I really feel like what may have happened as the collagen may have dried over the  wound bed and that because that misunderstanding with thinking that the wound was healing. With that being said I did not see it last week I do not know that for certain. Either way I feel like he is doing great today I see no signs of infection at this point. 11/26/2020 upon inspection today patient appears to be doing decently well in regard to his heel ulcer. He has been tolerating the dressing changes without complication. Fortunately there is no sign of active infection at this time. No fevers, chills, nausea, vomiting, or diarrhea. 12/10/2020 upon evaluation today patient appears to be doing fairly well in regard to the wound on his heel as well as what appears to be a new wound of the left first metatarsal head plantar aspect. This seems to be an area that was callus that has split as the patient tells me has been trying to walk on his toes more has probably where this came from. With that being said there does not appear to be signs of active infection which is great news. 12/17/2020 upon evaluation today patient actually appears to be making good progress currently. Fortunately there is no evidence of active infection at this time. Overall I feel like he is very close to complete closure. 12/26/2020 upon evaluation today patient appears to be doing excellent in regard to his wounds. In fact I am not certain that these are not even completely healed on initial inspection. Overall I am very pleased with where things stand today. Good news is that they are healed he is actually get ready to go out of town and that will be helpful as well as he will be a full part of the time. AXZEL, ROCKHILL ([id number]) [id number] Page 3 of 11 Readmission: 02/04/2021 upon evaluation today patient appears to be doing well at this point  in regard to his left heel that I previously saw him for. Unfortunately he is having issues with his right lower extremity. He has significant wounds at  this point he also has erythema noted there is definite signs of cellulitis which is unfortunate. With that being said I think we do need to address this sooner rather than later. The good news is he did have a nice trip to the beach. He tells me that he had no issues during that time. 4/27; patient on Bactrim. Culture showed methicillin sensitive staph aureus therefore the Bactrim should be effective. 2 small areas on the right leg are healed the area on the mid aspect of the tibia almost 100% covered in a very adherent necrotic debris. We have been using silver alginate 02/20/2021 upon evaluation today patient appears to be doing well with regard to his wound. He is showing signs of improvement which is great news overall very pleased with where things stand today. No fevers, chills, nausea, vomiting, or diarrhea. 02/27/2021 upon evaluation today patient appears to be doing well with regard to his leg ulcer. He is tolerating the dressing changes and overall appears to be doing quite excellent. I am extremely pleased with where things stand and overall I think patient is making great progress. There is no sign of active infection at this time which is also great news. 03/06/2021 upon evaluation today patient appears to be doing excellent in regard to his wounds. In fact he appears to be completely healed today based on what I am seeing. This is excellent news and overall I am extremely pleased with where he stands. Overall the patient is happy to hear this as well this has been quite sometime coming. Readmission: 04/01/2021 patient unfortunately returns for readmission today. He tells me that he has been wearing his compression socks on the right leg daily and he does not really know what is going on and why his legs are doing what they are doing. We did therefore go ahead and probe deeper into exactly what has been going on with him today. Subsequently the patient tells me that when he gets up and what  we would call "first thing in the morning" are really 2 different things". He does not tend to sleep well so he tells me that he will often wake up at 3:00 in the morning. He will then potentially going into the living room to get in his chair where he may read a book for a little while and then potentially fall asleep back in his chair. He then subsequently wake up around 5:00 or so and then get up and in his words "putter around". This often will end with him proceeding at some point around 8 AM or 9 AM to putting on his compression socks. With that being said this means that anywhere from the 3:00 in the morning till roughly around 9:00 in the morning he has no compression on yet he is sitting in his recliner, walking around and up and about, and this is at least 5 to 6 hours of noncompressed time. That may be our issue here but I did not realize until we discussed this further today. Fortunately there does not appear to be any signs of active infection at this time which is great news. With that being said he has multiple wounds of the bilateral lower extremities. 04/10/2021 upon evaluation today patient appears to be doing well with regard to his legs for the most part. He does  look like he had some injury where his wrap slid down nonetheless he did some "doctoring on them". I do feel like most of the areas honestly have cleared back up which is great news. There does not appear to be any signs of active infection at this time which is also great news. No fevers, chills, nausea, vomiting, or diarrhea. 04/18/2021 upon evaluation today patient appears to be doing well with regard to his wounds in fact he appears to be completely healed which is great news. Fortunately there does not appear to be any signs of active infection which is great news. No fevers, chills, nausea, vomiting, or diarrhea. READMISSION 07/28/2021 This is a now 77 year old man we have had in this clinic for quite a bit of this  year. 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 He wears compression stockings. 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 He comes in an old running shoes although he says he wears better shoes at home. His history is that he noticed a blister in the right posterior heel. This open. He has been applying Neosporin to it currently the wound measures 2 x 1.4 cm. 100% slough covered. Not really offloading this in any rigorous way. Past medical history is essentially unchanged he has paroxysmal atrial fibrillation on Coumadin type 2 diabetes with a recent hemoglobin A1c of 7 on metformin. ABI in our clinic was 0.98 on the right 08/05/2021 upon evaluation today patient appears to be doing well with regard to his heel ulcer. Fortunately there does not appear to be any signs of active infection at this time. Overall I been very pleased with where things seem to be currently as far as the wound healing is concerned. Again this is first a lot of seeing him Dr. Dellia Nims readmitted him last week. Nonetheless I think we are definitely making some progress here. 08/11/2021 upon evaluation today patient's wound on the heel actually showing signs of excellent improvement. I am actually very pleased with where things stand currently. No fevers, chills, nausea, vomiting, or diarrhea. There does not appear to be any need for sharp debridement today either which is also great news. 08/25/2021 upon evaluation today patient appears to be doing well with regard to his heel ulcer. This is actually looking significantly improved compared to last time I saw him. Fortunately there does not appear to be any evidence of active infection at this time. No fevers, chills, nausea, vomiting, or diarrhea. 09/01/2021 upon evaluation today patient appears to be doing decently well in regard to his wounds. Fortunately there does  not appear to be any signs of active infection at this time which is great news and overall very pleased in that regard. I do not see any evidence of active infection systemically which is great news as well. No fevers, chills, nausea, vomiting, or diarrhea. I think that the heel is making excellent progress. 09/15/2021 upon evaluation today patient's heel unfortunately is significantly worse compared to what it was previous. I do believe that at this time he would benefit from switching back to something a little bit more offloading from the cushion shoe he has right now this is more of a heel protector what I really need is a Prevalon offloading boot which I think is good to do a lot better for him than just the small heel protector. 09/23/2021 upon evaluation today patient appears to be doing a little better  in regards to last week's visit. Overall I think that he is making good progress which is great news and there does not appear to be any evidence of active infection at this time. No fevers, chills, nausea, vomiting, or diarrhea. 09/30/2021 upon evaluation patient's heel is actually showing signs of good improvement which is great news. Fortunately there does not appear to be any evidence of active infection locally nor systemically at this point. No fevers, chills, nausea, vomiting, or diarrhea. 10/07/2021 upon evaluation today patient appears to be doing well currently in regard to his heel ulcer. He has been tolerating the dressing changes without complication. Fortunately I do not see any signs of active infection at this time which is great news. No fevers, chills, nausea, vomiting, or diarrhea. 12/27; wound is measuring smaller. We have been using silver alginate 10/28/2021 upon evaluation today patient appears to be doing well currently in regard to his heel ulcer. I am actually very pleased with where things stand and I think he is making excellent progress. Fortunately I do not see any  signs of active infection locally nor systemically at this point which is great news. Nonetheless I do believe that the patient would benefit from a new offloading shoe and has been using it Velcro is no longer functioning properly and he tells me that he almost tripped and fell because of that today. Obviously I do not want him following that would be very bad. 11/11/2021 upon evaluation today patient appears to be doing excellent in regard to his wound. In fact this appears to be completely healed based on what I see [name], [name] ([id number]) [id number] Page 4 of 11 currently. I do not see any signs of anything open or draining and I did double check just by clearing away some of the callus around the edges of the wound to ensure that there was nothing still open or hiding underneath. Once this was cleared away it appeared that the patient was doing significantly better at this time which is great news and there was nothing actually open. Readmission: 06-19-2022 upon evaluation today patient appears to be doing well currently in regard to his heel ulcer all things considered. He unfortunately is having a bit of breakdown in general as far as the heel is concerned not nearly as bad as what we noted last time he was here in the clinic. The good news is I do think that this should hopefully heal much more rapidly than what we noted last time. His past medical history has not changed he is on Coumadin. 07-03-2022 upon evaluation today patient appears to be doing better in regard to his heel. We will start to see some improvement here which is good news. Fortunately I do not see any evidence of infection locally or systemically at this time which is great news. No fevers, chills, nausea, vomiting, or diarrhea. 07-10-2022 upon evaluation today patient appears to be doing well currently in regard to his wound from the callus standpoint around the edges. Unfortunately from an  actual wound bed standpoint he has some deep tissue injury noted at this point. Again I am not sure exactly what is going on that is causing this pressure to the region but he is definitely gotten pressure that is occurring and causing this to not heal as effectively as what we would like to see. I discussed with him today that he may need to at night when he sleeping use a pillow up underneath his calf in  order to prevent the heel from touching the bed at all. I also think that it may be beneficial for him to monitor throughout his day and make sure there is no other time when he is getting the pressure to the heel. 07-23-2022 upon evaluation today patient appears to be doing poorly currently in regard to his heel ulcer. He has been tolerating the dressing changes without complication. Unfortunately I feel like he may be infected based on what I am seeing. 07-28-2022 I did review patient's culture results which showed positive for Proteus as well as Staphylococcus both of which would be treated with the Bactrim DS that I gave him at the last visit. This is good news and hopefully means that we should be able to apply the cast today as long as the wound itself does not appear to be doing poorly. Patient's wound bed actually showed signs of doing quite well and I am very pleased with where things stand and I do not see any signs of worsening overall which is great news. 08-04-2022 upon evaluation today patient appears to be doing well currently in regard to his heel ulcer. I am actually very pleased with where things stand and I do think this is looking significantly better. With regard to his right shin that is also showing signs of improvement which is great news. 08-10-2022 upon evaluation today patient's wound on the heel actually appears to be doing significantly better. In regard to the right anterior shin this is showing signs of healing it might even be completely healed but I still get a monitor  I cannot get anything to fill away but also could not see where there was anything actually draining at this point. I am tending to think healed but I want a monitor 1 more week before I call it for sure. 08-17-2022 upon evaluation today patient appears to be doing well currently in regard to his heel. Fortunately he is tolerating the dressing changes without complication. I do not see any signs of infection and overall I think he is making good progress here. We did get approval for the Apligraf but I think he had a $295 co-pay that would have to be paid per application. With that being said as good as he is doing right now I do not think that is necessary but is still an option depending on how things progress if we need to speed this up quite a bit. 08/24/2022; this patient has a wound on his left heel in the setting of type 2 diabetes. We have been using Prisma. He has a heel offloading boot 08-31-2022 upon evaluation today patient appears to be doing poorly in regard to his heel ulcer which is still showing signs of bruising I am just not as happy as I was when I saw him 2 weeks ago with this. He saw Dr. Dellia Nims last week. Also did not look good at that point apparently. Nonetheless I think that the patient is still continuing to have some counterpressure getting to the wound bed unfortunately. 09-07-2022 upon evaluation today patient appears to be doing well currently in regard to his heel ulcer. He has been tolerating the dressing changes without complication. Fortunately there does not appear to be any signs of active infection locally or systemically at this time. Fortunately I do not see any evidence of active infection at this time. 09-15-2022 upon evaluation today patient appears to be doing well currently in regard to his legs which in fact appear  to be completely healed this is great news. With that being said his heel does have some erythema and warmth as well as drainage I am concerned  about cellulitis at this location. 09-21-2022 upon evaluation today patient's wound is actually showing signs of being a little bit smaller but still we are not doing nearly as well as what I would like to see. Fortunately I do not see any signs of infection at this time which is great news and overall I am extremely pleased in that regard. With that being said I do think that he needs to continue to elevate his leg although the edema is under great control with a compression wrap I think switching to a different dressing topically may be beneficial. My suggestion is to switch him over to a Iodoflex dressing. 09-28-2022 upon evaluation today patient appears to be doing well with regard to his heel ulcer. This is actually showing signs of improvement which is great news and overall I am extremely pleased with where we stand today. 10-06-2022 upon evaluation today patient actually appears to be making excellent progress at this point. Fortunately there does not appear to be any signs of active infection locally nor systemically which is great news and overall I am extremely pleased with where we stand. I do feel like he is actually making some pretty good progress here as we switch to the wrap along with the Iodoflex. 12/26; patient has a wound on the tip of his left heel. This is not a weightbearing surface. He is using a heel offloading boot and carefully offloading this at other times during the day. He is using Iodoflex and Zetuvitunder 3 layer compression 10-22-2022 upon evaluation today patient appears to be doing well currently in regard to his wound. He has been tolerating the dressing changes without complication. Fortunately there does not appear to be any signs of active infection locally nor systemically at this time. No fevers, chills, nausea, vomiting, or diarrhea. 08-30-2023 upon evaluation today patient appears to be doing well currently in regard to his heel ulcer which is showing signs of  good improvement. Fortunately I do not see any evidence of infection locally nor systemically which is great news and overall I am extremely pleased with where we stand. No fevers, chills, nausea, vomiting, or diarrhea. Unfortunately he does have a wound on the side of his fifth toe where I think this did rub on the shoe. 11-05-2022 upon evaluation today patient still still showing some signs of pressure at this point and there may have even been some fluid collection at this time. Fortunately I do not see any evidence of active infection locally nor systemically which is great news and overall I am extremely pleased with where we stand. No fevers, chills, nausea, vomiting, or diarrhea. 1/25; the patient's area on the dorsal aspect of the left fifth toe is healed. We have been using Iodoflex to the area on the left heel. The lateral wound is slightly down in length. 11-19-2022 upon evaluation today patient appears to be doing well currently in regard to his wound. He has been tolerating the dressing changes without complication. Fortunately there does not appear to be any signs of infection we been using Iodoflex up to this point. Electronic Signature(s) Signed: 11/19/2022 1:46:17 PM By: Worthy Keeler PA-C Entered By: Worthy Keeler on 11/19/2022 13:46:17 [name] ([id number]) [id number] Page 5 of 11 -------------------------------------------------------------------------------- Physical Exam Details Patient Name: Date of Service: [name], [name] [name]  11/19/2022 1:00 PM Medical Record Number: [id number] Patient Account Number: [id number] Date of Birth/Sex: Treating RN: 08/09/1946 (77 y.o. [name] Primary Care Provider: Fulton Reek Other Clinician: Massie Kluver Referring Provider: Treating Provider/Extender: Maurilio Lovely Weeks in Treatment: 21 Constitutional Obese and well-hydrated in no acute distress. Respiratory normal  breathing without difficulty. Psychiatric this patient is able to make decisions and demonstrates good insight into disease process. Alert and Oriented x 3. pleasant and cooperative. Notes Upon inspection patient's wound bed actually showed signs of good granulation epithelization at this point. Fortunately I do not see any evidence of active infection locally nor systemically which is great news and overall I do believe that we are headed in the right direction. I did perform debridement to clear away some of the necrotic debris he tolerated that today without complication and postdebridement the wound bed is significantly improved. Electronic Signature(s) Signed: 11/19/2022 1:46:37 PM By: Worthy Keeler PA-C Entered By: Worthy Keeler on 11/19/2022 13:46:37 -------------------------------------------------------------------------------- Physician Orders Details Patient Name: Date of Service: [name], [name] [name] 11/19/2022 1:00 PM Medical Record Number: [id number] Patient Account Number: [id number] Date of Birth/Sex: Treating RN: 11/24/1945 (77 y.o. 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 Bathing/ Shower/ Hygiene May shower; gently cleanse wound with antibacterial soap, rinse and pat dry prior to dressing wounds Edema Control - Lymphedema / Segmental Compressive Device / Other 3 Layer Compression System for Lymphedema.  - left lower leg Non-Wound Condition dditional non-wound orders/instructions: - Apply 40% Urea cream to dry cracked skin on feet. A Off-Loading Open toe surgical shoe with peg assist. Wound Treatment Wound #10 - Calcaneus Wound Laterality: Left Prim Dressing: IODOFLEX 0.9% Cadexomer Iodine Pad 3 x Per Week/30 Days ary Discharge Instructions: Apply Iodoflex to wound bed only as directed. Secondary Dressing: Zetuvit Plus 4x8 (in/in) 3 x Per Week/30 Days Compression Wrap: 3-LAYER WRAP - Profore Lite LF 3 Multilayer Compression Bandaging System 3 x Per Week/30 Days Discharge Instructions: Apply 3 multi-layer wrap as prescribed. Electronic Signature(s) Signed: 11/19/2022 4:41:53 PM By: Worthy Keeler PA-C Signed: 11/24/2022 4:49:02 PM By: Massie Kluver Entered By: Massie Kluver on 11/19/2022 13:34:59 -------------------------------------------------------------------------------- Problem List Details Patient Name: Date of Service: [name], [name] [name] 11/19/2022 1:00 PM Medical Record Number: [id number] Patient Account Number: [id number] Date of Birth/Sex: Treating RN: 1946/07/15 (77 y.o. [name] Primary Care Provider: Fulton Reek Other Clinician: Massie Kluver Referring Provider: Treating Provider/Extender: Maurilio Lovely Weeks in Treatment: 21 Active Problems ICD-10 Encounter Code Description Active Date MDM Diagnosis E11.621 Type 2 diabetes mellitus with foot ulcer 06/19/2022 No Yes 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sed 06/19/2022 No Yes E11.42 Type 2 diabetes mellitus with diabetic polyneuropathy 06/19/2022 No Yes Inactive Problems Resolved Problems ARIA, PICKRELL ([id number]) [id number] Page 7 of 11 Electronic Signature(s) Signed: 11/19/2022 1:10:20 PM By: Worthy Keeler PA-C Entered By: Worthy Keeler on 11/19/2022  13:10:19 -------------------------------------------------------------------------------- Progress Note Details Patient Name: Date of Service: [name], [name] [name]  J. 11/19/2022 1:00 PM Medical Record Number: [id number] Patient Account Number: [id number] Date of Birth/Sex: Treating RN: 08-29-1946 (77 y.o. [name] Primary Care Provider: Fulton Reek Other Clinician: Massie Kluver Referring Provider: Treating Provider/Extender: Maurilio Lovely Weeks in Treatment: 21 Subjective Chief Complaint Information obtained from Patient Left heel ulcer History of Present Illness (HPI) 09/24/2020 on evaluation today patient presents today for a heel ulcer that he tells me has been present for about 2 years. He has been seeing podiatry and they have been attempting to manage this including what sounds to be a total contact cast, Unna boot, and just standard dressings otherwise as well. Most recently has been using triple antibiotic ointment. With that being said unfortunately despite everything he really has not had any significant improvement. He tells me that he cannot even really remember exactly how this began but he presumed it may have rubbed on his shoes or something of that nature. 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 He tells me that this does seem to help. Fortunately there is no signs of active infection at this time. His most recent hemoglobin A1c was 8.1 though he knows this was this year he cannot tell me the exact time. His fluid pills currently to help with some of the lower extremity edema although he does obviously have signs of venous stasis/lymphedema. Currently there is no evidence of  active infection. No fevers, chills, nausea, vomiting, or diarrhea. Patient has had fairly recent ABIs which were performed on 07/19/2020 and revealed that he has normal findings in both the ankle and toe locations bilaterally. His ABI on the right was 1.09 on the left was 1.08 with a TBI on the right of 0.88 and on the left of 0.94. Triphasic flow was noted throughout. 10/08/2020 on evaluation today patient appears to be doing pretty well in regard to his left heel currently in fact this is doing a great job and seems to be healing quite nicely. Unfortunately on his right leg he had a pile of wood that actually fell on him injuring his right leg this is somewhat erythematous has me concerned little bit about cellulitis though there is not really a good area to culture at this point. 10/24/2020 upon evaluation today patient appears to be doing well with regard to his heel ulcer. He is showing signs of improvement which is great news. His right leg is completely healed. Overall I feel like he is doing excellent and there is no signs of infection. 11/07/2020 upon evaluation today patient appears to be doing well with regard to his heel ulcer. He tells me that last week when he was unable to come in his wife actually thought that the wound was very close to closing if not closed. Then it began to "reopen again". I really feel like what may have happened as the collagen may have dried over the wound bed and that because that misunderstanding with thinking that the wound was healing. With that being said I did not see it last week I do not know that for certain. Either way I feel like he is doing great today I see no signs of infection at this point. 11/26/2020 upon inspection today  patient appears to be doing decently well in regard to his heel ulcer. He has been tolerating the dressing changes without complication. Fortunately there is no sign of active infection at this time. No fevers, chills, nausea,  vomiting, or diarrhea. 12/10/2020 upon evaluation today patient appears to be doing fairly well in regard to the wound on his heel as well as what appears to be a new wound of the left first metatarsal head plantar aspect. This seems to be an area that was callus that has split as the patient tells me has been trying to walk on his toes more has probably where this came from. With that being said there does not appear to be signs of active infection which is great news. 12/17/2020 upon evaluation today patient actually appears to be making good progress currently. Fortunately there is no evidence of active infection at this time. Overall I feel like he is very close to complete closure. 12/26/2020 upon evaluation today patient appears to be doing excellent in regard to his wounds. In fact I am not certain that these are not even completely healed on initial inspection. Overall I am very pleased with where things stand today. Good news is that they are healed he is actually get ready to go out of town and that will be helpful as well as he will be a full part of the time. Readmission: 02/04/2021 upon evaluation today patient appears to be doing well at this point in regard to his left heel that I previously saw him for. Unfortunately he is having issues with his right lower extremity. He has significant wounds at this point he also has erythema noted there is definite signs of cellulitis which is unfortunate. With that being said I think we do need to address this sooner rather than later. The good news is he did have a nice trip to the beach. He tells me that he had no issues during that time. 4/27; patient on Bactrim. Culture showed methicillin sensitive staph aureus therefore the Bactrim should be effective. 2 small areas on the right leg are healed the area on the mid aspect of the tibia almost 100% covered in a very adherent necrotic debris. We have been using silver alginate [name], [name]  ([id number]) [id number] Page 8 of 11 02/20/2021 upon evaluation today patient appears to be doing well with regard to his wound. He is showing signs of improvement which is great news overall very pleased with where things stand today. No fevers, chills, nausea, vomiting, or diarrhea. 02/27/2021 upon evaluation today patient appears to be doing well with regard to his leg ulcer. He is tolerating the dressing changes and overall appears to be doing quite excellent. I am extremely pleased with where things stand and overall I think patient is making great progress. There is no sign of active infection at this time which is also great news. 03/06/2021 upon evaluation today patient appears to be doing excellent in regard to his wounds. In fact he appears to be completely healed today based on what I am seeing. This is excellent news and overall I am extremely pleased with where he stands. Overall the patient is happy to hear this as well this has been quite sometime coming. Readmission: 04/01/2021 patient unfortunately returns for readmission today. He tells me that he has been wearing his compression socks on the right leg daily and he does not really know what is going on and why his legs are doing what they are doing.  We did therefore go ahead and probe deeper into exactly what has been going on with him today. Subsequently the patient tells me that when he gets up and what we would call "first thing in the morning" are really 2 different things". He does not tend to sleep well so he tells me that he will often wake up at 3:00 in the morning. He will then potentially going into the living room to get in his chair where he may read a book for a little while and then potentially fall asleep back in his chair. He then subsequently wake up around 5:00 or so and then get up and in his words "putter around". This often will end with him proceeding at some point around 8 AM or 9 AM to  putting on his compression socks. With that being said this means that anywhere from the 3:00 in the morning till roughly around 9:00 in the morning he has no compression on yet he is sitting in his recliner, walking around and up and about, and this is at least 5 to 6 hours of noncompressed time. That may be our issue here but I did not realize until we discussed this further today. Fortunately there does not appear to be any signs of active infection at this time which is great news. With that being said he has multiple wounds of the bilateral lower extremities. 04/10/2021 upon evaluation today patient appears to be doing well with regard to his legs for the most part. He does look like he had some injury where his wrap slid down nonetheless he did some "doctoring on them". I do feel like most of the areas honestly have cleared back up which is great news. There does not appear to be any signs of active infection at this time which is also great news. No fevers, chills, nausea, vomiting, or diarrhea. 04/18/2021 upon evaluation today patient appears to be doing well with regard to his wounds in fact he appears to be completely healed which is great news. Fortunately there does not appear to be any signs of active infection which is great news. No fevers, chills, nausea, vomiting, or diarrhea. READMISSION 07/28/2021 This is a now 77 year old man we have had in this clinic for quite a bit of this year. 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 He wears compression stockings. 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 He comes in an old running shoes although he says he wears better shoes at home. His history is that he noticed a blister in the right posterior heel. This open. He has been applying Neosporin to it currently the wound measures 2 x 1.4 cm. 100% slough covered. Not really offloading  this in any rigorous way. Past medical history is essentially unchanged he has paroxysmal atrial fibrillation on Coumadin type 2 diabetes with a recent hemoglobin A1c of 7 on metformin. ABI in our clinic was 0.98 on the right 08/05/2021 upon evaluation today patient appears to be doing well with regard to his heel ulcer. Fortunately there does not appear to be any signs of active infection at this time. Overall I been very pleased with where things seem to be currently as far as the wound healing is concerned. Again this is first a lot of seeing him Dr. Dellia Nims readmitted him last week. Nonetheless I think we are definitely making some progress here. 08/11/2021 upon evaluation today patient's  wound on the heel actually showing signs of excellent improvement. I am actually very pleased with where things stand currently. No fevers, chills, nausea, vomiting, or diarrhea. There does not appear to be any need for sharp debridement today either which is also great news. 08/25/2021 upon evaluation today patient appears to be doing well with regard to his heel ulcer. This is actually looking significantly improved compared to last time I saw him. Fortunately there does not appear to be any evidence of active infection at this time. No fevers, chills, nausea, vomiting, or diarrhea. 09/01/2021 upon evaluation today patient appears to be doing decently well in regard to his wounds. Fortunately there does not appear to be any signs of active infection at this time which is great news and overall very pleased in that regard. I do not see any evidence of active infection systemically which is great news as well. No fevers, chills, nausea, vomiting, or diarrhea. I think that the heel is making excellent progress. 09/15/2021 upon evaluation today patient's heel unfortunately is significantly worse compared to what it was previous. I do believe that at this time he would benefit from switching back to something a  little bit more offloading from the cushion shoe he has right now this is more of a heel protector what I really need is a Prevalon offloading boot which I think is good to do a lot better for him than just the small heel protector. 09/23/2021 upon evaluation today patient appears to be doing a little better in regards to last week's visit. Overall I think that he is making good progress which is great news and there does not appear to be any evidence of active infection at this time. No fevers, chills, nausea, vomiting, or diarrhea. 09/30/2021 upon evaluation patient's heel is actually showing signs of good improvement which is great news. Fortunately there does not appear to be any evidence of active infection locally nor systemically at this point. No fevers, chills, nausea, vomiting, or diarrhea. 10/07/2021 upon evaluation today patient appears to be doing well currently in regard to his heel ulcer. He has been tolerating the dressing changes without complication. Fortunately I do not see any signs of active infection at this time which is great news. No fevers, chills, nausea, vomiting, or diarrhea. 12/27; wound is measuring smaller. We have been using silver alginate 10/28/2021 upon evaluation today patient appears to be doing well currently in regard to his heel ulcer. I am actually very pleased with where things stand and I think he is making excellent progress. Fortunately I do not see any signs of active infection locally nor systemically at this point which is great news. Nonetheless I do believe that the patient would benefit from a new offloading shoe and has been using it Velcro is no longer functioning properly and he tells me that he almost tripped and fell because of that today. Obviously I do not want him following that would be very bad. 11/11/2021 upon evaluation today patient appears to be doing excellent in regard to his wound. In fact this appears to be completely healed based on  what I see currently. I do not see any signs of anything open or draining and I did double check just by clearing away some of the callus around the edges of the wound to ensure that there was nothing still open or hiding underneath. Once this was cleared away it appeared that the patient was doing significantly better at this time which is great  news and there was nothing actually open. Readmission: 06-19-2022 upon evaluation today patient appears to be doing well currently in regard to his heel ulcer all things considered. He unfortunately is having a bit of breakdown in general as far as the heel is concerned not nearly as bad as what we noted last time he was here in the clinic. The good news is I do think that this should hopefully heal much more rapidly than what we noted last time. His past medical history has not changed he is on Coumadin. 07-03-2022 upon evaluation today patient appears to be doing better in regard to his heel. We will start to see some improvement here which is good news. [name], [name] ([id number]) [id number] Page 9 of 11 Fortunately I do not see any evidence of infection locally or systemically at this time which is great news. No fevers, chills, nausea, vomiting, or diarrhea. 07-10-2022 upon evaluation today patient appears to be doing well currently in regard to his wound from the callus standpoint around the edges. Unfortunately from an actual wound bed standpoint he has some deep tissue injury noted at this point. Again I am not sure exactly what is going on that is causing this pressure to the region but he is definitely gotten pressure that is occurring and causing this to not heal as effectively as what we would like to see. I discussed with him today that he may need to at night when he sleeping use a pillow up underneath his calf in order to prevent the heel from touching the bed at all. I also think that it may be beneficial for him to  monitor throughout his day and make sure there is no other time when he is getting the pressure to the heel. 07-23-2022 upon evaluation today patient appears to be doing poorly currently in regard to his heel ulcer. He has been tolerating the dressing changes without complication. Unfortunately I feel like he may be infected based on what I am seeing. 07-28-2022 I did review patient's culture results which showed positive for Proteus as well as Staphylococcus both of which would be treated with the Bactrim DS that I gave him at the last visit. This is good news and hopefully means that we should be able to apply the cast today as long as the wound itself does not appear to be doing poorly. Patient's wound bed actually showed signs of doing quite well and I am very pleased with where things stand and I do not see any signs of worsening overall which is great news. 08-04-2022 upon evaluation today patient appears to be doing well currently in regard to his heel ulcer. I am actually very pleased with where things stand and I do think this is looking significantly better. With regard to his right shin that is also showing signs of improvement which is great news. 08-10-2022 upon evaluation today patient's wound on the heel actually appears to be doing significantly better. In regard to the right anterior shin this is showing signs of healing it might even be completely healed but I still get a monitor I cannot get anything to fill away but also could not see where there was anything actually draining at this point. I am tending to think healed but I want a monitor 1 more week before I call it for sure. 08-17-2022 upon evaluation today patient appears to be doing well currently in regard to his heel. Fortunately he is tolerating the dressing changes without complication.  I do not see any signs of infection and overall I think he is making good progress here. We did get approval for the Apligraf but I think he  had a $295 co-pay that would have to be paid per application. With that being said as good as he is doing right now I do not think that is necessary but is still an option depending on how things progress if we need to speed this up quite a bit. 08/24/2022; this patient has a wound on his left heel in the setting of type 2 diabetes. We have been using Prisma. He has a heel offloading boot 08-31-2022 upon evaluation today patient appears to be doing poorly in regard to his heel ulcer which is still showing signs of bruising I am just not as happy as I was when I saw him 2 weeks ago with this. He saw Dr. Dellia Nims last week. Also did not look good at that point apparently. Nonetheless I think that the patient is still continuing to have some counterpressure getting to the wound bed unfortunately. 09-07-2022 upon evaluation today patient appears to be doing well currently in regard to his heel ulcer. He has been tolerating the dressing changes without complication. Fortunately there does not appear to be any signs of active infection locally or systemically at this time. Fortunately I do not see any evidence of active infection at this time. 09-15-2022 upon evaluation today patient appears to be doing well currently in regard to his legs which in fact appear to be completely healed this is great news. With that being said his heel does have some erythema and warmth as well as drainage I am concerned about cellulitis at this location. 09-21-2022 upon evaluation today patient's wound is actually showing signs of being a little bit smaller but still we are not doing nearly as well as what I would like to see. Fortunately I do not see any signs of infection at this time which is great news and overall I am extremely pleased in that regard. With that being said I do think that he needs to continue to elevate his leg although the edema is under great control with a compression wrap I think switching to a  different dressing topically may be beneficial. My suggestion is to switch him over to a Iodoflex dressing. 09-28-2022 upon evaluation today patient appears to be doing well with regard to his heel ulcer. This is actually showing signs of improvement which is great news and overall I am extremely pleased with where we stand today. 10-06-2022 upon evaluation today patient actually appears to be making excellent progress at this point. Fortunately there does not appear to be any signs of active infection locally nor systemically which is great news and overall I am extremely pleased with where we stand. I do feel like he is actually making some pretty good progress here as we switch to the wrap along with the Iodoflex. 12/26; patient has a wound on the tip of his left heel. This is not a weightbearing surface. He is using a heel offloading boot and carefully offloading this at other times during the day. He is using Iodoflex and Zetuvitunder 3 layer compression 10-22-2022 upon evaluation today patient appears to be doing well currently in regard to his wound. He has been tolerating the dressing changes without complication. Fortunately there does not appear to be any signs of active infection locally nor systemically at this time. No fevers, chills, nausea, vomiting, or diarrhea. 08-30-2023  upon evaluation today patient appears to be doing well currently in regard to his heel ulcer which is showing signs of good improvement. Fortunately I do not see any evidence of infection locally nor systemically which is great news and overall I am extremely pleased with where we stand. No fevers, chills, nausea, vomiting, or diarrhea. Unfortunately he does have a wound on the side of his fifth toe where I think this did rub on the shoe. 11-05-2022 upon evaluation today patient still still showing some signs of pressure at this point and there may have even been some fluid collection at this time. Fortunately I do not  see any evidence of active infection locally nor systemically which is great news and overall I am extremely pleased with where we stand. No fevers, chills, nausea, vomiting, or diarrhea. 1/25; the patient's area on the dorsal aspect of the left fifth toe is healed. We have been using Iodoflex to the area on the left heel. The lateral wound is slightly down in length. 11-19-2022 upon evaluation today patient appears to be doing well currently in regard to his wound. He has been tolerating the dressing changes without complication. Fortunately there does not appear to be any signs of infection we been using Iodoflex up to this point. Objective Constitutional Obese and well-hydrated in no acute distress. Vitals Time Taken: 1:10 PM, Height: 70 in, Weight: 265 lbs, BMI: 38, Temperature: 97.7 F, Pulse: 64 bpm, Respiratory Rate: 18 breaths/min, Blood Pressure: 157/66 mmHg, Pulse Oximetry: 92 %. [name], [name] ([id number]) [id number] Page 10 of 11 Respiratory normal breathing without difficulty. Psychiatric this patient is able to make decisions and demonstrates good insight into disease process. Alert and Oriented x 3. pleasant and cooperative. General Notes: Upon inspection patient's wound bed actually showed signs of good granulation epithelization at this point. Fortunately I do not see any evidence of active infection locally nor systemically which is great news and overall I do believe that we are headed in the right direction. I did perform debridement to clear away some of the necrotic debris he tolerated that today without complication and postdebridement the wound bed is significantly improved. Integumentary (Hair, Skin) Wound #10 status is Open. Original cause of wound was Gradually Appeared. The date acquired was: 06/05/2022. The wound has been in treatment 21 weeks. The wound is located on the Left Calcaneus. The wound measures 1cm length x 2cm width x 0.2cm  depth; 1.571cm^2 area and 0.314cm^3 volume. There is Fat Layer (Subcutaneous Tissue) exposed. There is a medium amount of serosanguineous drainage noted. The wound margin is flat and intact. There is small (1- 33%) pink granulation within the wound bed. There is a large (67-100%) amount of necrotic tissue within the wound bed including Adherent Slough. 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 There was a Excisional Skin/Subcutaneous Tissue Debridement with a total area of 2 sq cm performed by Tommie Sams., PA-C. With the following instrument(s): Curette to remove Viable and Non-Viable tissue/material. Material removed includes Callus, Subcutaneous Tissue, and Slough. A time out was conducted at 13:31, prior to the start of the procedure. A Minimum amount of bleeding was controlled with Pressure. The procedure was tolerated  well. Post Debridement Measurements: 1cm length x 2cm width x 0.2cm depth; 0.314cm^3 volume. Character of Wound/Ulcer Post Debridement is stable. Severity of Tissue Post Debridement is: Fat layer exposed. 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 There was a Three Layer Compression Therapy Procedure with a pre-treatment ABI of 1 by Massie Kluver. Post procedure Diagnosis Wound #10: Same as Pre-Procedure Plan Follow-up Appointments: Wound #10 Left Calcaneus: Return Appointment in 1 week. Bathing/ Shower/ Hygiene: May shower; gently cleanse wound with antibacterial soap, rinse and pat  dry prior to dressing wounds Edema Control - Lymphedema / Segmental Compressive Device / Other: 3 Layer Compression System for Lymphedema. - left lower leg Non-Wound Condition: Additional non-wound orders/instructions: - Apply 40% Urea cream to dry cracked skin on feet. Off-Loading: Open toe surgical shoe with peg assist. WOUND #10: - Calcaneus Wound Laterality: Left Prim Dressing: IODOFLEX 0.9% Cadexomer Iodine Pad 3 x Per Week/30 Days ary Discharge Instructions: Apply Iodoflex to wound bed only as directed. Secondary Dressing: Zetuvit Plus 4x8 (in/in) 3 x Per Week/30 Days Com pression Wrap: 3-LAYER WRAP - Profore Lite LF 3 Multilayer Compression Bandaging System 3 x Per Week/30 Days Discharge Instructions: Apply 3 multi-layer wrap as prescribed. 1. I am going to suggest that we have the patient continue to monitor for any signs of infection or worsening. Based on what I am seeing I do believe that he should continue with the Iodoflex which is doing a good job here. 2. I am also going to recommend that we have the patient continue to monitor for any signs of infection or worsening such as increased pain obviously if anything changes he knows to contact the office and let me know. Otherwise we will continue with the 3 layer compression wrap currently. 3. I do feel like he is doing much better in regard to the heel and bruising especially since we switched him over to the new shoe. We will see patient back for reevaluation in 1 week here in the clinic. If anything worsens or changes patient will contact our office for additional recommendations. [name], [name] ([id number]) [id number] Page 11 of 11 Electronic Signature(s) Signed: 11/19/2022 1:48:56 PM By: Worthy Keeler PA-C Entered By: Worthy Keeler on 11/19/2022 13:48:56 -------------------------------------------------------------------------------- SuperBill Details Patient Name: Date of Service: [name], [name] [name] 11/19/2022 Medical Record Number: [id number] Patient Account Number: [id number] Date of Birth/Sex: Treating RN: [birth year] (77 y.o. [name] Primary Care Provider: Fulton Reek Other Clinician: Massie Kluver Referring Provider: Treating Provider/Extender: Maurilio Lovely Weeks in Treatment: 21 Diagnosis Coding ICD-10 Codes Code Description E11.621 Type 2 diabetes mellitus with foot ulcer 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sed E11.42 Type 2 diabetes mellitus with diabetic polyneuropathy Facility Procedures : CPT4 Code: [id number] 1 Description: 9983 - DEB SUBQ TISSUE 20 SQ CM/< ICD-10 Diagnosis Description 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sed Modifier: Quantity: 1 Physician Procedures : CPT4 Code Description Modifier [id number] - WC PHYS SUBQ TISS 20 SQ CM ICD-10 Diagnosis Description 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sed Quantity: 1 Electronic Signature(s) Signed: 11/19/2022 1:49:14 PM By: Worthy Keeler PA-C Entered By: Worthy Keeler on 11/19/2022 13:49:13

## 2022-11-25 NOTE — Progress Notes (Signed)
CHAI, ROUTH ([id number]) [id number] Page 1 of 9 Visit Report for 11/19/2022 Arrival Information Details Patient Name: Date of Service: [name], [name] [facility] J. 11/19/2022 1:00 PM Medical Record Number: [id number] Patient Account Number: [id number] Date of Birth/Sex: Treating RN: Aug 15, 1946 (77 y.o. [name] Primary Care Noelly Lasseigne: Fulton Reek Other Clinician: Massie Kluver Referring Danett Palazzo: Treating Sneha Willig/Extender: [name] in Treatment: 21 Visit Information History Since Last Visit All ordered tests and consults were completed: No Patient Arrived: [name] Added or deleted any medications: No Arrival Time: 13:07 Any new allergies or adverse reactions: No Transfer Assistance: None Had a fall or experienced change in No Patient Identification Verified: Yes activities of daily living that may affect Secondary Verification Process Completed: Yes risk of falls: Patient Requires Transmission-Based Precautions: No Signs or symptoms of abuse/neglect since No Patient Has Alerts: Yes last visito Patient Alerts: Patient on Blood Thinner Hospitalized since last visit: No Warfarin Implantable device outside of the clinic No Type II Diabetic excluding cellular tissue based products placed in the center since last visit: Has Dressing in Place as Prescribed: Yes Has Compression in Place as Prescribed: Yes Has Footwear/Offloading in Place as Yes Prescribed: Left: Surgical Shoe with Pressure Relief Insole Pain Present Now: No Electronic Signature(s) Signed: 11/24/2022 4:49:02 PM By: Massie Kluver Entered By: Massie Kluver on 11/19/2022 13:09:28 -------------------------------------------------------------------------------- Clinic Level of Care Assessment Details Patient Name: Date of Service: [name], [name] [name] 11/19/2022 1:00 PM Medical Record Number: [id number] Patient Account Number: [id number] Date of Birth/Sex:  Treating RN: [birth year] (77 y.o. [name] Primary Care Rashea Hoskie: Fulton Reek Other Clinician: Massie Kluver Referring Eulia Hatcher: Treating Zahir Eisenhour/Extender: [name] in Treatment: [place] Assessment Items [name], [name] ([id number]) [phone].pdf Page 2 of 9 TOOL 1 Quantity Score []  - 0 Use when EandM and Procedure is performed on INITIAL visit ASSESSMENTS - Nursing Assessment / Reassessment []  - 0 General Physical Exam (combine w/ comprehensive assessment (listed just below) when performed on new pt. evals) []  - 0 Comprehensive Assessment (HX, ROS, Risk Assessments, Wounds Hx, etc.) ASSESSMENTS - Wound and Skin Assessment / Reassessment []  - 0 Dermatologic / Skin Assessment (not related to wound area) ASSESSMENTS - Ostomy and/or Continence Assessment and Care []  - 0 Incontinence Assessment and Management []  - 0 Ostomy Care Assessment and Management (repouching, etc.) PROCESS - Coordination of Care []  - 0 Simple Patient / Family Education for ongoing care []  - 0 Complex (extensive) Patient / Family Education for ongoing care []  - 0 Staff obtains Programmer, systems, Records, T Results / Process Orders est []  - 0 Staff telephones HHA, Nursing Homes / Clarify orders / etc []  - 0 Routine Transfer to another Facility (non-emergent condition) []  - 0 Routine Hospital Admission (non-emergent condition) []  - 0 New Admissions / Biomedical engineer / Ordering NPWT Apligraf, etc. , []  - 0 Emergency Hospital Admission (emergent condition) PROCESS - Special Needs []  - 0 Pediatric / Minor Patient Management []  - 0 Isolation Patient Management []  - 0 Hearing / Language / Visual special needs []  - 0 Assessment of Community assistance (transportation, D/C planning, etc.) []  - 0 Additional assistance / Altered mentation []  - 0 Support Surface(s) Assessment (bed, cushion, seat, etc.) INTERVENTIONS -  Miscellaneous []  - 0 External ear exam []  - 0 Patient Transfer (multiple staff / Civil Service fast streamer / Similar devices) []  - 0 Simple Staple / Suture removal (25 or less) []  - 0 Complex Staple / Suture removal (  26 or more) []  - 0 Hypo/Hyperglycemic Management (do not check if billed separately) []  - 0 Ankle / Brachial Index (ABI) - do not check if billed separately Has the patient been seen at the hospital within the last three years: Yes Total Score: 0 Level Of Care: ____ Electronic Signature(s) Signed: 11/24/2022 4:49:02 PM By: Massie Kluver Entered By: Massie Kluver on 11/19/2022 13:35:05 [name] ([id number]) [id number] Page 3 of 9 -------------------------------------------------------------------------------- Compression Therapy Details Patient Name: Date of Service: [name], [name] [name] 11/19/2022 1:00 PM Medical Record Number: [id number] Patient Account Number: [id number] Date of Birth/Sex: Treating RN: 1945-11-13 (77 y.o. [name] Primary Care Rania Prothero: Fulton Reek Other Clinician: Massie Kluver Referring Jazlene Bares: Treating Rahel Carlton/Extender: Maurilio Lovely Weeks in Treatment: 21 Compression Therapy Performed for Wound Assessment: Wound #10 Left Calcaneus Performed By: Lenice Pressman, Angie, Compression Type: Three Layer Pre Treatment ABI: 1 Post Procedure Diagnosis Same as Pre-procedure Electronic Signature(s) Signed: 11/24/2022 4:49:02 PM By: Massie Kluver Entered By: Massie Kluver on 11/19/2022 13:34:33 -------------------------------------------------------------------------------- Encounter Discharge Information Details Patient Name: Date of Service: [name], [name] [name] 11/19/2022 1:00 PM Medical Record Number: [id number] Patient Account Number: [id number] Date of Birth/Sex: Treating RN: 1946-09-18 (77 y.o. [name] Primary Care Sema Stangler: Fulton Reek Other Clinician: Massie Kluver Referring  Latoya Maulding: Treating Nashae Maudlin/Extender: [name] in Treatment: 21 Encounter Discharge Information Items Post Procedure Vitals Discharge Condition: Stable Temperature (F): 97.7 Ambulatory Status: Walker Pulse (bpm): 64 Discharge Destination: Home Respiratory Rate (breaths/min): 18 Transportation: Private Auto Blood Pressure (mmHg): 157/66 Accompanied By: self Schedule Follow-up Appointment: Yes Clinical Summary of Care: Electronic Signature(s) Signed: 11/24/2022 4:49:02 PM By: Massie Kluver Entered By: Massie Kluver on 11/19/2022 13:54:30 -------------------------------------------------------------------------------- Lower Extremity Assessment Details Patient Name: Date of Service: [name], [name] [name] 11/19/2022 1:00 PM [name] ([id number]) [id number] Page 4 of 9 Medical Record Number: [id number] Patient Account Number: [id number] Date of Birth/Sex: Treating RN: 1946/03/25 (77 y.o. [name] Primary Care Pranay Hilbun: Fulton Reek Other Clinician: Massie Kluver Referring Rosella Crandell: Treating Tangi Shroff/Extender: Maurilio Lovely Weeks in Treatment: 21 Edema Assessment Assessed: Shirlyn Goltz: Yes] Patrice Paradise: No] Edema: [Left: Ye] [Right: s] Calf Left: Right: Point of Measurement: 33 cm From Medial Instep 39.3 cm Ankle Left: Right: Point of Measurement: 11 cm From Medial Instep 23 cm Vascular Assessment Pulses: Dorsalis Pedis Palpable: [Left:Yes] Electronic Signature(s) Signed: 11/20/2022 4:09:44 PM By: [name], BSN, RN, CWS, [name] RN, BSN Signed: 11/24/2022 4:49:02 PM By: Massie Kluver Entered By: Massie Kluver on 11/19/2022 13:26:15 -------------------------------------------------------------------------------- Multi Wound Chart Details Patient Name: Date of Service: [name], [name] [name] 11/19/2022 1:00 PM Medical Record Number: [id number] Patient Account Number: [id number] Date of Birth/Sex: Treating  RN: Oct 17, 1946 (77 y.o. 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 N/A N/A Secondary Etiology: Cataracts, Arrhythmia, Coronary N/A N/A Comorbid History: Artery Disease, Hypertension, Type II Diabetes, Osteoarthritis, Neuropathy 06/05/2022 N/A N/A Date Acquired: 21 N/A N/A Weeks of Treatment: Open N/A N/A Wound Status: No N/A N/A Wound Recurrence: 1x2x0.2 N/A N/A Measurements L x W x D (cm)  1.571 N/A N/A A (cm) : rea 0.314 N/A N/A Volume (cm) : -11.10% N/A N/A % Reduction in A rea: -11.00% N/A N/A % Reduction in Volume: Grade 1 N/A N/A Classification: Medium N/A N/A Exudate A mount: Serosanguineous N/A N/A Exudate Type: red, brown N/A N/A Exudate Color: Flat and Intact N/A N/A Wound Margin: Small (1-33%) N/A N/A Granulation A mount: Pink N/A N/A Granulation Quality: Large (67-100%) N/A N/A Necrotic A mount: Fat Layer (Subcutaneous Tissue): Yes N/A N/A Exposed Structures: Fascia: No Tendon: No Muscle: No Joint: No Bone: No Small (1-33%) N/A N/A Epithelialization: Treatment Notes Electronic Signature(s) Signed: 11/24/2022 4:49:02 PM By: Massie Kluver Entered By: Massie Kluver on 11/19/2022 13:26:20 -------------------------------------------------------------------------------- Multi-Disciplinary Care Plan Details Patient Name: Date of Service: [name], [name] [name]  11/19/2022 1:00 PM Medical Record Number: [id number] Patient Account Number: [id number] Date of Birth/Sex: Treating RN: [birth year] (77 y.o. [name] Primary Care Vanna Sailer: Fulton Reek Other Clinician: Massie Kluver Referring Sotiria Keast: Treating Zylan Almquist/Extender: [name] in Treatment: 21 Active Inactive Pressure Nursing Diagnoses: Knowledge deficit related to causes and risk factors for pressure ulcer development Knowledge deficit related to management of pressures ulcers Potential for impaired tissue integrity related to pressure, friction, moisture, and shear Goals: Patient will remain free from development of additional pressure ulcers Date Initiated: 06/19/2022 Date Inactivated: 07/28/2022 Target Resolution Date: 06/19/2022 Goal Status: Met Patient/caregiver will verbalize risk factors for pressure ulcer development Date Initiated: 06/19/2022 Date Inactivated: 07/28/2022 Target Resolution Date: 06/19/2022 Goal Status: Met [name], [name] ([id number]) [id number] Page 6 of 9 Patient/caregiver will verbalize understanding of pressure ulcer management Date Initiated: 06/19/2022 Target Resolution Date: 08/14/2022 Goal Status: Active Interventions: Assess: immobility, friction, shearing, incontinence upon admission and as needed Assess offloading mechanisms upon admission and as needed Assess potential for pressure ulcer upon admission and as needed Provide education on pressure ulcers Notes: Wound/Skin Impairment Nursing Diagnoses: Impaired tissue integrity Goals: Patient/caregiver will verbalize understanding of skin care regimen Date Initiated: 06/19/2022 Date Inactivated: 07/10/2022 Target Resolution Date: 06/19/2022 Goal Status: Met Ulcer/skin breakdown will have a volume reduction of 30% by week 4 Date Initiated: 06/19/2022 Date Inactivated: 07/28/2022 Target Resolution Date: 07/17/2022 Goal Status: Unmet Unmet Reason:  infection Ulcer/skin breakdown will have a volume reduction of 50% by week 8 Date Initiated: 07/28/2022 Target Resolution Date: 08/14/2022 Goal Status: Active Interventions: Assess patient/caregiver ability to obtain necessary supplies Assess patient/caregiver ability to perform ulcer/skin care regimen upon admission and as needed Assess ulceration(s) every visit Provide education on ulcer and skin care Treatment Activities: Skin care regimen initiated : 06/19/2022 Topical wound management initiated : 06/19/2022 Notes: Electronic Signature(s) Signed: 11/20/2022 4:09:44 PM By: [name], BSN, RN, CWS, [name] RN, BSN Signed: 11/24/2022 4:49:02 PM By: Massie Kluver Entered By: Massie Kluver on 11/19/2022 13:53:39 -------------------------------------------------------------------------------- Pain Assessment Details Patient Name: Date of Service: [name], [name] [name] 11/19/2022 1:00 PM Medical Record Number: [id number] Patient Account Number: [id number] Date of Birth/Sex: Treating RN: 1946/03/16 (77 y.o. [name] Primary Care Seynabou Fults: Fulton Reek Other Clinician: Massie Kluver Referring Calah Gershman: Treating Kylia Grajales/Extender: Maurilio Lovely Weeks in Treatment: 21 Active Problems Location of Pain Severity and Description of Pain Patient Has Paino No Site Locations [name], [name] ([id number]) [phone].pdf Page 7 of 9 Pain Management and Medication Current Pain Management: Electronic Signature(s) Signed: 11/20/2022 4:09:44 PM By: [name], BSN, RN, CWS, [name] RN, BSN Signed: 11/24/2022 4:49:02 PM By: Massie Kluver Entered By: Massie Kluver on 11/19/2022 13:15:34 -------------------------------------------------------------------------------- Patient/Caregiver Education Details Patient Name:  Date of Service: [name] 2/1/2024andnbsp1:00 PM Medical Record Number: [id number] Patient Account Number: [id number] Date of Birth/Gender: Treating  RN: 12-16-1945 (77 y.o. [name] Primary Care Physician: Fulton Reek Other Clinician: Massie Kluver Referring Physician: Treating Physician/Extender: [name] in Treatment: 21 Education Assessment Education Provided To: Patient Education Topics Provided Wound/Skin Impairment: Handouts: Other: continue wound care as directed Methods: Explain/Verbal Responses: State content correctly Electronic Signature(s) Signed: 11/24/2022 4:49:02 PM By: Massie Kluver Entered By: Massie Kluver on 11/19/2022 13:53:31 [name] ([id number]) [id number] Page 8 of 9 -------------------------------------------------------------------------------- Wound Assessment Details Patient Name: Date of Service: [name], [name] [name] 11/19/2022 1:00 PM Medical Record Number: [id number] Patient Account Number: [id number] Date of Birth/Sex: Treating RN: 27-Jul-1946 (77 y.o. [name], [name] Primary Care Arra Connaughton: Fulton Reek Other Clinician: Massie Kluver Referring Cheo Selvey: Treating Brinton Brandel/Extender: Maurilio Lovely Weeks in Treatment: 21 Wound Status Wound Number: 10 Primary Diabetic Wound/Ulcer of the Lower Extremity Etiology: Wound Location: Left Calcaneus Secondary Pressure Ulcer Wounding Event: Gradually Appeared Etiology: Date Acquired: 06/05/2022 Wound Open Weeks Of Treatment: 21 Status: Clustered Wound: No Comorbid Cataracts, Arrhythmia, Coronary Artery Disease, Hypertension, History: Type II Diabetes, Osteoarthritis, Neuropathy Photos Wound Measurements Length: (cm) 1 Width: (cm) 2 Depth: (cm) 0.2 Area: (cm) 1.571 Volume: (cm) 0.314 % Reduction in Area: -11.1% % Reduction in Volume: -11% Epithelialization: Small (1-33%) Wound Description Classification: Grade 1 Wound Margin: Flat and Intact Exudate Amount: Medium Exudate Type: Serosanguineous Exudate Color: red, brown Foul Odor After Cleansing:  No Slough/Fibrino Yes Wound Bed Granulation Amount: Small (1-33%) Exposed Structure Granulation Quality: Pink Fascia Exposed: No Necrotic Amount: Large (67-100%) Fat Layer (Subcutaneous Tissue) Exposed: Yes Necrotic Quality: Adherent Slough Tendon Exposed: No Muscle Exposed: No Joint Exposed: No Bone Exposed: No Treatment Notes Wound #10 (Calcaneus) Wound Laterality: Left Cleanser Peri-Wound Care [name], [name] ([id number]) [id number] Page 9 of 9 Topical Primary Dressing IODOFLEX 0.9% Cadexomer Iodine Pad Discharge Instruction: Apply Iodoflex to wound bed only as directed. Secondary Dressing Zetuvit Plus 4x8 (in/in) Secured With Compression Wrap 3-LAYER WRAP - Profore Lite LF 3 Multilayer Compression Bandaging System Discharge Instruction: Apply 3 multi-layer wrap as prescribed. Compression Stockings Environmental education officer) Signed: 11/20/2022 4:09:44 PM By: [name], BSN, RN, CWS, [name] RN, BSN Signed: 11/24/2022 4:49:02 PM By: Massie Kluver Entered By: Massie Kluver on 11/19/2022 13:25:23 -------------------------------------------------------------------------------- Vitals Details Patient Name: Date of Service: [name], [name] [name] 11/19/2022 1:00 PM Medical Record Number: [id number] Patient Account Number: [id number] Date of Birth/Sex: Treating RN: July 02, 1946 (77 y.o. [name], [name] Primary Care Totiana Everson: Fulton Reek Other Clinician: Massie Kluver Referring Ayjah Show: Treating Naiyah Klostermann/Extender: Maurilio Lovely Weeks in Treatment: 21 Vital Signs Time Taken: 13:10 Temperature (F): 97.7 Height (in): 70 Pulse (bpm): 64 Weight (lbs): 265 Respiratory Rate (breaths/min): 18 Body Mass Index (BMI): 38 Blood Pressure (mmHg): 157/66 Reference Range: 80 - 120 mg / dl Airway Pulse Oximetry (%): 92 Electronic Signature(s) Signed: 11/24/2022 4:49:02 PM By: Massie Kluver Entered By: Massie Kluver on 11/19/2022 13:14:35

## 2022-11-26 ENCOUNTER — Other Ambulatory Visit
Admission: RE | Admit: 2022-11-26 | Discharge: 2022-11-26 | Disposition: A | Discharge status: Home / Self Care | Payer: Medicare HMO | Source: Ambulatory Visit | Attending: Physician Assistant | Admitting: Physician Assistant

## 2022-11-26 ENCOUNTER — Encounter: Payer: Medicare HMO | Admitting: Physician Assistant

## 2022-11-26 DIAGNOSIS — B999 Unspecified infectious disease: Secondary | ICD-10-CM | POA: Insufficient documentation

## 2022-11-26 DIAGNOSIS — E11621 Type 2 diabetes mellitus with foot ulcer: Secondary | ICD-10-CM | POA: Diagnosis not present

## 2022-11-26 NOTE — Progress Notes (Addendum)
[name], [name] ([id number]) [id number] Page 1 of 9 Visit Report for 11/26/2022 Arrival Information Details Patient Name: Date of Service: [name], [name] [facility] [name]. 11/26/2022 1:00 PM Medical Record Number: [id number] Patient Account Number: [id number] Date of Birth/Sex: Treating RN: August 21, 1946 (77 y.o. [name] Primary Care Rashawd Laskaris: Fulton Reek Other Clinician: Massie Kluver Referring Jasmaine Rochel: Treating Damyiah Moxley/Extender: [name] in Treatment: 13 Visit Information History Since Last Visit All ordered tests and consults were completed: No Patient Arrived: [name] Added or deleted any medications: No Arrival Time: 13:05 Any new allergies or adverse reactions: No Transfer Assistance: None Had a fall or experienced change in Yes Patient Identification Verified: Yes activities of daily living that may affect Secondary Verification Process Completed: Yes risk of falls: Patient Requires Transmission-Based Precautions: No Signs or symptoms of abuse/neglect since No Patient Has Alerts: Yes last visito Patient Alerts: Patient on Blood Thinner Hospitalized since last visit: No Warfarin Implantable device outside of the clinic No Type II Diabetic excluding cellular tissue based products placed in the center since last visit: Has Dressing in Place as Prescribed: Yes Has Compression in Place as Prescribed: Yes Has Footwear/Offloading in Place as Yes Prescribed: Left: Surgical Shoe with Pressure Relief Insole Pain Present Now: No Electronic Signature(s) Signed: 11/27/2022 1:48:57 PM By: Massie Kluver Entered By: Massie Kluver on 11/26/2022 13:07:57 -------------------------------------------------------------------------------- Clinic Level of Care Assessment Details Patient Name: Date of Service: [name], [name]. 11/26/2022 1:00 PM Medical Record Number: [id number] Patient Account Number: [id number] Date of Birth/Sex:  Treating RN: 02/28/1946 (77 y.o. [name] Primary Care Demi Trieu: Fulton Reek Other Clinician: Massie Kluver Referring Brycelynn Stampley: Treating Vesper Trant/Extender: [name] in Treatment: [place] Assessment Items [name], [name] ([id number]) [phone].pdf Page 2 of 9 TOOL 1 Quantity Score []  - 0 Use when EandM and Procedure is performed on INITIAL visit ASSESSMENTS - Nursing Assessment / Reassessment []  - 0 General Physical Exam (combine w/ comprehensive assessment (listed just below) when performed on new pt. evals) []  - 0 Comprehensive Assessment (HX, ROS, Risk Assessments, Wounds Hx, etc.) ASSESSMENTS - Wound and Skin Assessment / Reassessment []  - 0 Dermatologic / Skin Assessment (not related to wound area) ASSESSMENTS - Ostomy and/or Continence Assessment and Care []  - 0 Incontinence Assessment and Management []  - 0 Ostomy Care Assessment and Management (repouching, etc.) PROCESS - Coordination of Care []  - 0 Simple Patient / Family Education for ongoing care []  - 0 Complex (extensive) Patient / Family Education for ongoing care []  - 0 Staff obtains Programmer, systems, Records, T Results / Process Orders est []  - 0 Staff telephones HHA, Nursing Homes / Clarify orders / etc []  - 0 Routine Transfer to another Facility (non-emergent condition) []  - 0 Routine Hospital Admission (non-emergent condition) []  - 0 New Admissions / Biomedical engineer / Ordering NPWT Apligraf, etc. , []  - 0 Emergency Hospital Admission (emergent condition) PROCESS - Special Needs []  - 0 Pediatric / Minor Patient Management []  - 0 Isolation Patient Management []  - 0 Hearing / Language / Visual special needs []  - 0 Assessment of Community assistance (transportation, D/C planning, etc.) []  - 0 Additional assistance / Altered mentation []  - 0 Support Surface(s) Assessment (bed, cushion, seat, etc.) INTERVENTIONS -  Miscellaneous []  - 0 External ear exam []  - 0 Patient Transfer (multiple staff / Civil Service fast streamer / Similar devices) []  - 0 Simple Staple / Suture removal (25 or less) []  - 0 Complex Staple / Suture removal (  26 or more) []  - 0 Hypo/Hyperglycemic Management (do not check if billed separately) []  - 0 Ankle / Brachial Index (ABI) - do not check if billed separately Has the patient been seen at the hospital within the last three years: Yes Total Score: 0 Level Of Care: ____ Electronic Signature(s) Signed: 11/27/2022 1:48:57 PM By: Massie Kluver Entered By: Massie Kluver on 11/26/2022 13:38:56 [name] ([id number]) [id number] Page 3 of 9 -------------------------------------------------------------------------------- Compression Therapy Details Patient Name: Date of Service: [name], [name]. 11/26/2022 1:00 PM Medical Record Number: [id number] Patient Account Number: [id number] Date of Birth/Sex: Treating RN: 1946-03-21 (77 y.o. [name] Primary Care Kathleen Tamm: Fulton Reek Other Clinician: Massie Kluver Referring Laquia Rosano: Treating Lynise Porr/Extender: Maurilio Lovely Weeks in Treatment: 22 Compression Therapy Performed for Wound Assessment: Wound #10 Left Calcaneus Performed By: Lenice Pressman, Angie, Compression Type: Three Layer Pre Treatment ABI: 1 Post Procedure Diagnosis Same as Pre-procedure Electronic Signature(s) Signed: 11/27/2022 1:48:57 PM By: Massie Kluver Entered By: Massie Kluver on 11/26/2022 13:36:22 -------------------------------------------------------------------------------- Encounter Discharge Information Details Patient Name: Date of Service: [name], [name]. 11/26/2022 1:00 PM Medical Record Number: [id number] Patient Account Number: [id number] Date of Birth/Sex: Treating RN: 1946-04-23 (77 y.o. [name] Primary Care Karinna Beadles: Fulton Reek Other Clinician: Massie Kluver Referring  Chike Farrington: Treating Lukasz Rogus/Extender: [name] in Treatment: 22 Encounter Discharge Information Items Post Procedure Vitals Discharge Condition: Stable Temperature (F): 97.7 Ambulatory Status: Walker Pulse (bpm): 66 Discharge Destination: Home Respiratory Rate (breaths/min): 18 Transportation: Private Auto Blood Pressure (mmHg): 150/77 Accompanied By: self Schedule Follow-up Appointment: Yes Clinical Summary of Care: Electronic Signature(s) Signed: 11/27/2022 1:48:57 PM By: Massie Kluver Entered By: Massie Kluver on 11/26/2022 14:50:24 -------------------------------------------------------------------------------- Lower Extremity Assessment Details Patient Name: Date of Service: [name], [name]. 11/26/2022 1:00 PM [name] ([id number]) [id number] Page 4 of 9 Medical Record Number: [id number] Patient Account Number: [id number] Date of Birth/Sex: Treating RN: 03/13/1946 (77 y.o. [name] Primary Care Amiir Heckard: Fulton Reek Other Clinician: Massie Kluver Referring Evin Loiseau: Treating Siris Hoos/Extender: Maurilio Lovely Weeks in Treatment: 22 Edema Assessment Assessed: [Left: Yes] [Right: No] Edema: [Left: Ye] [Right: s] Calf Left: Right: Point of Measurement: 33 cm From Medial Instep 39 cm Ankle Left: Right: Point of Measurement: 11 cm From Medial Instep 23 cm Vascular Assessment Pulses: Dorsalis Pedis Palpable: [Left:Yes] Electronic Signature(s) Signed: 11/27/2022 1:48:57 PM By: Massie Kluver Signed: 11/27/2022 1:49:05 PM By: [name], BSN, RN, CWS, [name] RN, BSN Entered By: Massie Kluver on 11/26/2022 13:24:10 -------------------------------------------------------------------------------- Multi Wound Chart Details Patient Name: Date of Service: [name], [name]. 11/26/2022 1:00 PM Medical Record Number: [id number] Patient Account Number: [id number] Date of Birth/Sex: Treating RN: 20-Sep-1946  (77 y.o. 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 N/A N/A Secondary Etiology: Cataracts, Arrhythmia, Coronary N/A N/A Comorbid History: Artery Disease, Hypertension, Type II Diabetes, Osteoarthritis, Neuropathy 06/05/2022 N/A N/A Date Acquired: 22 N/A N/A Weeks of Treatment: Open N/A N/A Wound Status: No N/A N/A Wound Recurrence: 1.8x2.3x0.2 N/A N/A Measurements L x W x D (cm)  3.252 N/A N/A A (cm) : rea 0.65 N/A N/A Volume (cm) : -130.00% N/A N/A % Reduction in A rea: -129.70% N/A N/A % Reduction in Volume: Grade 1 N/A N/A Classification: Medium N/A N/A Exudate A mount: Serosanguineous N/A N/A Exudate Type: red, brown N/A N/A Exudate Color: Flat and Intact N/A N/A Wound Margin: Small (1-33%) N/A N/A Granulation A mount: Pink N/A N/A Granulation Quality: Large (67-100%) N/A N/A Necrotic A mount: Fat Layer (Subcutaneous Tissue): Yes N/A N/A Exposed Structures: Fascia: No Tendon: No Muscle: No Joint: No Bone: No Small (1-33%) N/A N/A Epithelialization: Treatment Notes Electronic Signature(s) Signed: 11/27/2022 1:48:57 PM By: Massie Kluver Entered By: Massie Kluver on 11/26/2022 13:24:22 -------------------------------------------------------------------------------- Multi-Disciplinary Care Plan Details Patient Name: Date of Service: [name], [name]. 11/26/2022 1:00  PM Medical Record Number: [id number] Patient Account Number: [id number] Date of Birth/Sex: Treating RN: [birth year] (77 y.o. [name] Primary Care Coston Mandato: Fulton Reek Other Clinician: Massie Kluver Referring Ambert Virrueta: Treating Kimla Furth/Extender: [name] in Treatment: 22 Active Inactive Pressure Nursing Diagnoses: Knowledge deficit related to causes and risk factors for pressure ulcer development Knowledge deficit related to management of pressures ulcers Potential for impaired tissue integrity related to pressure, friction, moisture, and shear Goals: Patient will remain free from development of additional pressure ulcers Date Initiated: 06/19/2022 Date Inactivated: 07/28/2022 Target Resolution Date: 06/19/2022 Goal Status: Met Patient/caregiver will verbalize risk factors for pressure ulcer development Date Initiated: 06/19/2022 Date Inactivated: 07/28/2022 Target Resolution Date: 06/19/2022 Goal Status: Met [name], [name] ([id number]) [id number] Page 6 of 9 Patient/caregiver will verbalize understanding of pressure ulcer management Date Initiated: 06/19/2022 Target Resolution Date: 08/14/2022 Goal Status: Active Interventions: Assess: immobility, friction, shearing, incontinence upon admission and as needed Assess offloading mechanisms upon admission and as needed Assess potential for pressure ulcer upon admission and as needed Provide education on pressure ulcers Notes: Wound/Skin Impairment Nursing Diagnoses: Impaired tissue integrity Goals: Patient/caregiver will verbalize understanding of skin care regimen Date Initiated: 06/19/2022 Date Inactivated: 07/10/2022 Target Resolution Date: 06/19/2022 Goal Status: Met Ulcer/skin breakdown will have a volume reduction of 30% by week 4 Date Initiated: 06/19/2022 Date Inactivated: 07/28/2022 Target Resolution Date: 07/17/2022 Goal Status: Unmet Unmet Reason:  infection Ulcer/skin breakdown will have a volume reduction of 50% by week 8 Date Initiated: 07/28/2022 Target Resolution Date: 08/14/2022 Goal Status: Active Interventions: Assess patient/caregiver ability to obtain necessary supplies Assess patient/caregiver ability to perform ulcer/skin care regimen upon admission and as needed Assess ulceration(s) every visit Provide education on ulcer and skin care Treatment Activities: Skin care regimen initiated : 06/19/2022 Topical wound management initiated : 06/19/2022 Notes: Electronic Signature(s) Signed: 11/27/2022 1:48:57 PM By: Massie Kluver Signed: 11/27/2022 1:49:05 PM By: [name], BSN, RN, CWS, [name] RN, BSN Entered By: Massie Kluver on 11/26/2022 14:49:04 -------------------------------------------------------------------------------- Pain Assessment Details Patient Name: Date of Service: [name], [name]. 11/26/2022 1:00 PM Medical Record Number: [id number] Patient Account Number: [id number] Date of Birth/Sex: Treating RN: [birth year] (77 y.o. [name] Primary Care Shantrell Placzek: Fulton Reek Other Clinician: Massie Kluver Referring Kemp Gomes: Treating Netta Fodge/Extender: Maurilio Lovely Weeks in Treatment: 22 Active Problems Location of Pain Severity and Description of Pain Patient Has Paino No Site Locations [name], [name] ([id number]) [phone].pdf Page 7 of 9 Pain Management and Medication Current Pain Management: Electronic Signature(s) Signed: 11/27/2022 1:48:57 PM By: Massie Kluver Signed: 11/27/2022 1:49:05 PM By: [name], BSN, RN, CWS, [name] RN, BSN Entered By: Massie Kluver on 11/26/2022 13:12:46 -------------------------------------------------------------------------------- Patient/Caregiver Education Details Patient Name:  Date of Service: [name] 2/8/2024andnbsp1:00 PM Medical Record Number: [id number] Patient Account Number: [id number] Date of Birth/Gender: Treating  RN: 1946/07/08 (77 y.o. [name] Primary Care Physician: Fulton Reek Other Clinician: Massie Kluver Referring Physician: Treating Physician/Extender: [name] in Treatment: 22 Education Assessment Education Provided To: Patient Education Topics Provided Wound/Skin Impairment: Handouts: Other: continue wound care as directed Methods: Explain/Verbal Responses: State content correctly Electronic Signature(s) Signed: 11/27/2022 1:48:57 PM By: Massie Kluver Entered By: Massie Kluver on 11/26/2022 14:48:57 [name] ([id number]) [id number] Page 8 of 9 -------------------------------------------------------------------------------- Wound Assessment Details Patient Name: Date of Service: [name], [name]. 11/26/2022 1:00 PM Medical Record Number: [id number] Patient Account Number: [id number] Date of Birth/Sex: Treating RN: [birth year] (76 y.o. [name], [name] Primary Care Arther Heisler: Fulton Reek Other Clinician: Massie Kluver Referring Kashius Dominic: Treating Laryssa Hassing/Extender: Maurilio Lovely Weeks in Treatment: 22 Wound Status Wound Number: 10 Primary Diabetic Wound/Ulcer of the Lower Extremity Etiology: Wound Location: Left Calcaneus Secondary Pressure Ulcer Wounding Event: Gradually Appeared Etiology: Date Acquired: 06/05/2022 Wound Open Weeks Of Treatment: 22 Status: Clustered Wound: No Comorbid Cataracts, Arrhythmia, Coronary Artery Disease, Hypertension, History: Type II Diabetes, Osteoarthritis, Neuropathy Photos Wound Measurements Length: (cm) 1.8 Width: (cm) 2.3 Depth: (cm) 0.2 Area: (cm) 3.252 Volume: (cm) 0.65 % Reduction in Area: -130% % Reduction in Volume: -129.7% Epithelialization: Small (1-33%) Wound Description Classification: Grade 1 Wound Margin: Flat and Intact Exudate Amount: Medium Exudate Type: Serosanguineous Exudate Color: red, brown Foul Odor After Cleansing:  No Slough/Fibrino Yes Wound Bed Granulation Amount: Small (1-33%) Exposed Structure Granulation Quality: Pink Fascia Exposed: No Necrotic Amount: Large (67-100%) Fat Layer (Subcutaneous Tissue) Exposed: Yes Necrotic Quality: Adherent Slough Tendon Exposed: No Muscle Exposed: No Joint Exposed: No Bone Exposed: No Treatment Notes Wound #10 (Calcaneus) Wound Laterality: Left Cleanser Peri-Wound Care [name], [name] ([id number]) [phone].pdf Page 9 of 9 Topical Primary Dressing IODOFLEX 0.9% Cadexomer Iodine Pad Discharge Instruction: Apply Iodoflex to wound bed only as directed. Secondary Dressing Zetuvit Plus 4x8 (in/in) Secured With Compression Wrap 3-LAYER WRAP - Profore Lite LF 3 Multilayer Compression Bandaging System Discharge Instruction: Apply 3 multi-layer wrap as prescribed. Compression Stockings Add-Ons Electronic Signature(s) Signed: 11/27/2022 1:48:57 PM By: Massie Kluver Signed: 11/27/2022 1:49:05 PM By: [name], BSN, RN, CWS, [name] RN, BSN Entered By: Massie Kluver on 11/26/2022 13:23:14 -------------------------------------------------------------------------------- Vitals Details Patient Name: Date of Service: [name], [name]. 11/26/2022 1:00 PM Medical Record Number: [id number] Patient Account Number: [id number] Date of Birth/Sex: Treating RN: 09/09/1946 (77 y.o. [name], [name] Primary Care Yaslyn Cumby: Fulton Reek Other Clinician: Massie Kluver Referring Belita Warsame: Treating Bobby Ragan/Extender: Maurilio Lovely Weeks in Treatment: 22 Vital Signs Time Taken: 13:09 Temperature (F): 97.9 Height (in): 70 Pulse (bpm): 66 Weight (lbs): 265 Respiratory Rate (breaths/min): 18 Body Mass Index (BMI): 38 Blood Pressure (mmHg): 150/77 Reference Range: 80 - 120 mg / dl Electronic Signature(s) Signed: 11/27/2022 1:48:57 PM By: Massie Kluver Entered By: Massie Kluver on 11/26/2022 13:12:39

## 2022-11-26 NOTE — Progress Notes (Addendum)
BUNNY, LOWDERMILK ([id number]) [id number] Page 1 of 12 Visit Report for 11/26/2022 Chief Complaint Document Details Patient Name: Date of Service: [name], [name] [name] 11/26/2022 1:00 PM Medical Record Number: [id number] Patient Account Number: [id number] Date of Birth/Sex: Treating RN: 24-Feb-1946 (76 y.o. [name] Primary Care Provider: Fulton Reek Other Clinician: Massie Kluver Referring Provider: Treating Provider/Extender: Maurilio Lovely Weeks in Treatment: 22 Information Obtained from: Patient Chief Complaint Left heel ulcer Electronic Signature(s) Signed: 11/26/2022 1:07:01 PM By: Worthy Keeler PA-C Entered By: Worthy Keeler on 11/26/2022 13:07:01 -------------------------------------------------------------------------------- Debridement Details Patient Name: Date of Service: [name], [name] [name] 11/26/2022 1:00 PM Medical Record Number: [id number] Patient Account Number: [id number] Date of Birth/Sex: Treating RN: Jul 19, 1946 (77 y.o. [name] Primary Care Provider: Fulton Reek Other Clinician: Massie Kluver Referring Provider: Treating Provider/Extender: Maurilio Lovely Weeks in Treatment: 22 Debridement Performed for Assessment: Wound #10 Left Calcaneus Performed By: Physician Tommie Sams., PA-C Debridement Type: Debridement Severity of Tissue Pre Debridement: Fat layer exposed Level of Consciousness (Pre-procedure): Awake and Alert Pre-procedure Verification/Time Out Yes - 13:34 Taken: Start Time: 13:34 T Area Debrided (L x W): otal 1.8 (cm) x 2.3 (cm) = 4.14 (cm) Tissue and other material debrided: Viable, Non-Viable, Slough, Subcutaneous, Biofilm, Slough Level: Skin/Subcutaneous Tissue Debridement Description: Excisional Instrument: Curette Bleeding: Minimum Hemostasis Achieved: Pressure Response to Treatment: Procedure was tolerated well Level of Consciousness (Post- Awake and  Alert procedure): FLOY, RIEGLER ([id number]) [id number] Page 2 of 12 Post Debridement Measurements of Total Wound Length: (cm) 1.8 Width: (cm) 2.3 Depth: (cm) 0.2 Volume: (cm) 0.65 Character of Wound/Ulcer Post Debridement: Stable Severity of Tissue Post Debridement: Fat layer exposed Post Procedure Diagnosis Same as Pre-procedure Electronic Signature(s) Signed: 11/26/2022 6:29:11 PM By: Worthy Keeler PA-C Signed: 11/27/2022 1:48:57 PM By: Massie Kluver Signed: 11/27/2022 1:49:05 PM By: [name], BSN, RN, CWS, [name] RN, BSN Entered By: Massie Kluver on 11/26/2022 13:35:45 -------------------------------------------------------------------------------- HPI Details Patient Name: Date of Service: [name], [name] [name] 11/26/2022 1:00 PM Medical Record Number: [id number] Patient Account Number: [id number] Date of Birth/Sex: Treating RN: January 05, 1946 (77 y.o. [name] Primary Care Provider: Fulton Reek Other Clinician: Massie Kluver Referring Provider: Treating Provider/Extender: Maurilio Lovely Weeks in Treatment: 22 History of Present Illness HPI Description: 09/24/2020 on evaluation today patient presents today for a heel ulcer that he tells me has been present for about 2 years. He has been seeing podiatry and they have been attempting to manage this including what sounds to be a total contact cast, Unna boot, and just standard dressings otherwise as well. Most recently has been using triple antibiotic ointment. With that being said unfortunately despite everything he really has not had any significant improvement. He tells me that he cannot even really remember exactly how this began but he presumed it may have rubbed on his shoes or something of that nature. 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 He tells me that this does seem to help. Fortunately there is no signs of active infection at this time. His most recent hemoglobin A1c was 8.1 though he knows this was this year he cannot tell me the exact time.  His fluid pills currently to help with some of the lower extremity edema although he does obviously have signs of venous stasis/lymphedema. Currently there is no evidence of active infection. No fevers, chills, nausea, vomiting, or diarrhea. Patient has had fairly recent ABIs which were performed on 07/19/2020 and revealed that he has normal findings in both the ankle and toe locations bilaterally. His ABI on the right was 1.09 on the left was 1.08 with a TBI on the right of 0.88 and on the left of 0.94. Triphasic flow was noted throughout. 10/08/2020 on evaluation today patient appears to be doing pretty well in regard to his left heel currently in fact this is doing a great job and seems to be healing quite nicely. Unfortunately on his right leg he had a pile of wood that actually fell on him injuring his right leg this is somewhat erythematous has me concerned little bit about cellulitis though there is not really a good area to culture at this point. 10/24/2020 upon evaluation today patient appears to be doing well with regard to his heel ulcer. He is showing signs of improvement which is great news. His right leg is completely healed. Overall I feel like he is doing excellent and there is no signs of infection. 11/07/2020 upon evaluation today patient appears to be doing well with regard to his heel ulcer. He tells me that last week when he was unable to come in his wife actually thought that the wound was very close to closing if not closed. Then it began to "reopen again". I really feel like what may have happened as the collagen may have dried  over the wound bed and that because that misunderstanding with thinking that the wound was healing. With that being said I did not see it last week I do not know that for certain. Either way I feel like he is doing great today I see no signs of infection at this point. 11/26/2020 upon inspection today patient appears to be doing decently well in regard to his heel ulcer. He has been tolerating the dressing changes without complication. Fortunately there is no sign of active infection at this time. No fevers, chills, nausea, vomiting, or diarrhea. 12/10/2020 upon evaluation today patient appears to be doing fairly well in regard to the wound on his heel as well as what appears to be a new wound of the left first metatarsal head plantar aspect. This seems to be an area that was callus that has split as the patient tells me has been trying to walk on his toes more has probably where this came from. With that being said there does not appear to be signs of active infection which is great news. 12/17/2020 upon evaluation today patient actually appears to be making good progress currently. Fortunately there is no evidence of active infection at this time. Overall I feel like he is very close to complete closure. 12/26/2020 upon evaluation today patient appears to be doing excellent in regard to his wounds. In fact I am not certain that these are not even completely healed on initial inspection. Overall I am very pleased with where things stand today. Good news is that they are healed he is actually get ready to go out of town and that will be helpful as well as he will be a full part of the time. CREW, GOREN ([id number]) [id number] Page 3 of 12 Readmission: 02/04/2021 upon evaluation today patient appears to be doing well at this point  in regard to his left heel that I previously saw him for. Unfortunately he is having issues with his right lower extremity. He has significant  wounds at this point he also has erythema noted there is definite signs of cellulitis which is unfortunate. With that being said I think we do need to address this sooner rather than later. The good news is he did have a nice trip to the beach. He tells me that he had no issues during that time. 4/27; patient on Bactrim. Culture showed methicillin sensitive staph aureus therefore the Bactrim should be effective. 2 small areas on the right leg are healed the area on the mid aspect of the tibia almost 100% covered in a very adherent necrotic debris. We have been using silver alginate 02/20/2021 upon evaluation today patient appears to be doing well with regard to his wound. He is showing signs of improvement which is great news overall very pleased with where things stand today. No fevers, chills, nausea, vomiting, or diarrhea. 02/27/2021 upon evaluation today patient appears to be doing well with regard to his leg ulcer. He is tolerating the dressing changes and overall appears to be doing quite excellent. I am extremely pleased with where things stand and overall I think patient is making great progress. There is no sign of active infection at this time which is also great news. 03/06/2021 upon evaluation today patient appears to be doing excellent in regard to his wounds. In fact he appears to be completely healed today based on what I am seeing. This is excellent news and overall I am extremely pleased with where he stands. Overall the patient is happy to hear this as well this has been quite sometime coming. Readmission: 04/01/2021 patient unfortunately returns for readmission today. He tells me that he has been wearing his compression socks on the right leg daily and he does not really know what is going on and why his legs are doing what they are doing. We did therefore go ahead and probe deeper into exactly what has been going on with him today. Subsequently the patient tells me that when he gets up  and what we would call "first thing in the morning" are really 2 different things". He does not tend to sleep well so he tells me that he will often wake up at 3:00 in the morning. He will then potentially going into the living room to get in his chair where he may read a book for a little while and then potentially fall asleep back in his chair. He then subsequently wake up around 5:00 or so and then get up and in his words "putter around". This often will end with him proceeding at some point around 8 AM or 9 AM to putting on his compression socks. With that being said this means that anywhere from the 3:00 in the morning till roughly around 9:00 in the morning he has no compression on yet he is sitting in his recliner, walking around and up and about, and this is at least 5 to 6 hours of noncompressed time. That may be our issue here but I did not realize until we discussed this further today. Fortunately there does not appear to be any signs of active infection at this time which is great news. With that being said he has multiple wounds of the bilateral lower extremities. 04/10/2021 upon evaluation today patient appears to be doing well with regard to his legs for the most part. He does  look like he had some injury where his wrap slid down nonetheless he did some "doctoring on them". I do feel like most of the areas honestly have cleared back up which is great news. There does not appear to be any signs of active infection at this time which is also great news. No fevers, chills, nausea, vomiting, or diarrhea. 04/18/2021 upon evaluation today patient appears to be doing well with regard to his wounds in fact he appears to be completely healed which is great news. Fortunately there does not appear to be any signs of active infection which is great news. No fevers, chills, nausea, vomiting, or diarrhea. READMISSION 07/28/2021 This is a now 77 year old man we have had in this clinic for quite a bit of  this year. 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 He wears compression stockings. 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 He comes in an old running shoes although he says he wears better shoes at home. His history is that he noticed a blister in the right posterior heel. This open. He has been applying Neosporin to it currently the wound measures 2 x 1.4 cm. 100% slough covered. Not really offloading this in any rigorous way. Past medical history is essentially unchanged he has paroxysmal atrial fibrillation on Coumadin type 2 diabetes with a recent hemoglobin A1c of 7 on metformin. ABI in our clinic was 0.98 on the right 08/05/2021 upon evaluation today patient appears to be doing well with regard to his heel ulcer. Fortunately there does not appear to be any signs of active infection at this time. Overall I been very pleased with where things seem to be currently as far as the wound healing is concerned. Again this is first a lot of seeing him Dr. Dellia Nims readmitted him last week. Nonetheless I think we are definitely making some progress here. 08/11/2021 upon evaluation today patient's wound on the heel actually showing signs of excellent improvement. I am actually very pleased with where things stand currently. No fevers, chills, nausea, vomiting, or diarrhea. There does not appear to be any need for sharp debridement today either which is also great news. 08/25/2021 upon evaluation today patient appears to be doing well with regard to his heel ulcer. This is actually looking significantly improved compared to last time I saw him. Fortunately there does not appear to be any evidence of active infection at this time. No fevers, chills, nausea, vomiting, or diarrhea. 09/01/2021 upon evaluation today patient appears to be doing decently well in regard to his wounds. Fortunately there  does not appear to be any signs of active infection at this time which is great news and overall very pleased in that regard. I do not see any evidence of active infection systemically which is great news as well. No fevers, chills, nausea, vomiting, or diarrhea. I think that the heel is making excellent progress. 09/15/2021 upon evaluation today patient's heel unfortunately is significantly worse compared to what it was previous. I do believe that at this time he would benefit from switching back to something a little bit more offloading from the cushion shoe he has right now this is more of a heel protector what I really need is a Prevalon offloading boot which I think is good to do a lot better for him than just the small heel protector. 09/23/2021 upon evaluation today patient appears to be doing a little better  in regards to last week's visit. Overall I think that he is making good progress which is great news and there does not appear to be any evidence of active infection at this time. No fevers, chills, nausea, vomiting, or diarrhea. 09/30/2021 upon evaluation patient's heel is actually showing signs of good improvement which is great news. Fortunately there does not appear to be any evidence of active infection locally nor systemically at this point. No fevers, chills, nausea, vomiting, or diarrhea. 10/07/2021 upon evaluation today patient appears to be doing well currently in regard to his heel ulcer. He has been tolerating the dressing changes without complication. Fortunately I do not see any signs of active infection at this time which is great news. No fevers, chills, nausea, vomiting, or diarrhea. 12/27; wound is measuring smaller. We have been using silver alginate 10/28/2021 upon evaluation today patient appears to be doing well currently in regard to his heel ulcer. I am actually very pleased with where things stand and I think he is making excellent progress. Fortunately I do not see  any signs of active infection locally nor systemically at this point which is great news. Nonetheless I do believe that the patient would benefit from a new offloading shoe and has been using it Velcro is no longer functioning properly and he tells me that he almost tripped and fell because of that today. Obviously I do not want him following that would be very bad. 11/11/2021 upon evaluation today patient appears to be doing excellent in regard to his wound. In fact this appears to be completely healed based on what I see JORDYN, DOANE ([id number]) [id number] Page 4 of 12 currently. I do not see any signs of anything open or draining and I did double check just by clearing away some of the callus around the edges of the wound to ensure that there was nothing still open or hiding underneath. Once this was cleared away it appeared that the patient was doing significantly better at this time which is great news and there was nothing actually open. Readmission: 06-19-2022 upon evaluation today patient appears to be doing well currently in regard to his heel ulcer all things considered. He unfortunately is having a bit of breakdown in general as far as the heel is concerned not nearly as bad as what we noted last time he was here in the clinic. The good news is I do think that this should hopefully heal much more rapidly than what we noted last time. His past medical history has not changed he is on Coumadin. 07-03-2022 upon evaluation today patient appears to be doing better in regard to his heel. We will start to see some improvement here which is good news. Fortunately I do not see any evidence of infection locally or systemically at this time which is great news. No fevers, chills, nausea, vomiting, or diarrhea. 07-10-2022 upon evaluation today patient appears to be doing well currently in regard to his wound from the callus standpoint around the edges. Unfortunately from an  actual wound bed standpoint he has some deep tissue injury noted at this point. Again I am not sure exactly what is going on that is causing this pressure to the region but he is definitely gotten pressure that is occurring and causing this to not heal as effectively as what we would like to see. I discussed with him today that he may need to at night when he sleeping use a pillow up underneath his calf in  order to prevent the heel from touching the bed at all. I also think that it may be beneficial for him to monitor throughout his day and make sure there is no other time when he is getting the pressure to the heel. 07-23-2022 upon evaluation today patient appears to be doing poorly currently in regard to his heel ulcer. He has been tolerating the dressing changes without complication. Unfortunately I feel like he may be infected based on what I am seeing. 07-28-2022 I did review patient's culture results which showed positive for Proteus as well as Staphylococcus both of which would be treated with the Bactrim DS that I gave him at the last visit. This is good news and hopefully means that we should be able to apply the cast today as long as the wound itself does not appear to be doing poorly. Patient's wound bed actually showed signs of doing quite well and I am very pleased with where things stand and I do not see any signs of worsening overall which is great news. 08-04-2022 upon evaluation today patient appears to be doing well currently in regard to his heel ulcer. I am actually very pleased with where things stand and I do think this is looking significantly better. With regard to his right shin that is also showing signs of improvement which is great news. 08-10-2022 upon evaluation today patient's wound on the heel actually appears to be doing significantly better. In regard to the right anterior shin this is showing signs of healing it might even be completely healed but I still get a monitor  I cannot get anything to fill away but also could not see where there was anything actually draining at this point. I am tending to think healed but I want a monitor 1 more week before I call it for sure. 08-17-2022 upon evaluation today patient appears to be doing well currently in regard to his heel. Fortunately he is tolerating the dressing changes without complication. I do not see any signs of infection and overall I think he is making good progress here. We did get approval for the Apligraf but I think he had a $295 co-pay that would have to be paid per application. With that being said as good as he is doing right now I do not think that is necessary but is still an option depending on how things progress if we need to speed this up quite a bit. 08/24/2022; this patient has a wound on his left heel in the setting of type 2 diabetes. We have been using Prisma. He has a heel offloading boot 08-31-2022 upon evaluation today patient appears to be doing poorly in regard to his heel ulcer which is still showing signs of bruising I am just not as happy as I was when I saw him 2 weeks ago with this. He saw Dr. Dellia Nims last week. Also did not look good at that point apparently. Nonetheless I think that the patient is still continuing to have some counterpressure getting to the wound bed unfortunately. 09-07-2022 upon evaluation today patient appears to be doing well currently in regard to his heel ulcer. He has been tolerating the dressing changes without complication. Fortunately there does not appear to be any signs of active infection locally or systemically at this time. Fortunately I do not see any evidence of active infection at this time. 09-15-2022 upon evaluation today patient appears to be doing well currently in regard to his legs which in fact appear  to be completely healed this is great news. With that being said his heel does have some erythema and warmth as well as drainage I am concerned  about cellulitis at this location. 09-21-2022 upon evaluation today patient's wound is actually showing signs of being a little bit smaller but still we are not doing nearly as well as what I would like to see. Fortunately I do not see any signs of infection at this time which is great news and overall I am extremely pleased in that regard. With that being said I do think that he needs to continue to elevate his leg although the edema is under great control with a compression wrap I think switching to a different dressing topically may be beneficial. My suggestion is to switch him over to a Iodoflex dressing. 09-28-2022 upon evaluation today patient appears to be doing well with regard to his heel ulcer. This is actually showing signs of improvement which is great news and overall I am extremely pleased with where we stand today. 10-06-2022 upon evaluation today patient actually appears to be making excellent progress at this point. Fortunately there does not appear to be any signs of active infection locally nor systemically which is great news and overall I am extremely pleased with where we stand. I do feel like he is actually making some pretty good progress here as we switch to the wrap along with the Iodoflex. 12/26; patient has a wound on the tip of his left heel. This is not a weightbearing surface. He is using a heel offloading boot and carefully offloading this at other times during the day. He is using Iodoflex and Zetuvitunder 3 layer compression 10-22-2022 upon evaluation today patient appears to be doing well currently in regard to his wound. He has been tolerating the dressing changes without complication. Fortunately there does not appear to be any signs of active infection locally nor systemically at this time. No fevers, chills, nausea, vomiting, or diarrhea. 08-30-2023 upon evaluation today patient appears to be doing well currently in regard to his heel ulcer which is showing signs of  good improvement. Fortunately I do not see any evidence of infection locally nor systemically which is great news and overall I am extremely pleased with where we stand. No fevers, chills, nausea, vomiting, or diarrhea. Unfortunately he does have a wound on the side of his fifth toe where I think this did rub on the shoe. 11-05-2022 upon evaluation today patient still still showing some signs of pressure at this point and there may have even been some fluid collection at this time. Fortunately I do not see any evidence of active infection locally nor systemically which is great news and overall I am extremely pleased with where we stand. No fevers, chills, nausea, vomiting, or diarrhea. 1/25; the patient's area on the dorsal aspect of the left fifth toe is healed. We have been using Iodoflex to the area on the left heel. The lateral wound is slightly down in length. 11-19-2022 upon evaluation today patient appears to be doing well currently in regard to his wound. He has been tolerating the dressing changes without complication. Fortunately there does not appear to be any signs of infection we been using Iodoflex up to this point. 11-26-2022 upon evaluation today patient appears to be doing poorly currently in regard to his wound. He has been tolerating the dressing changes with the Iodoflex without complication. With that being said he does not have any signs of active infection systemically  though locally I do feel like there is some evidence of infection here. Electronic Signature(s) Signed: 11/26/2022 1:51:10 PM By: Derry Lory ([id number]) By: Worthy Keeler PA-C [phone].pdf Page 5 of 12 Signed: 11/26/2022 1:51:10 PM Entered By: Worthy Keeler on 11/26/2022 13:51:09 -------------------------------------------------------------------------------- Physical Exam Details Patient Name: Date of Service: [name], [name] [name] 11/26/2022 1:00 PM Medical  Record Number: [id number] Patient Account Number: [id number] Date of Birth/Sex: Treating RN: 17-Aug-1946 (77 y.o. [name] Primary Care Provider: Fulton Reek Other Clinician: Massie Kluver Referring Provider: Treating Provider/Extender: Maurilio Lovely Weeks in Treatment: 33 Constitutional Obese and well-hydrated in no acute distress. Respiratory normal breathing without difficulty. Psychiatric this patient is able to make decisions and demonstrates good insight into disease process. Alert and Oriented x 3. pleasant and cooperative. Notes Upon inspection patient's wound bed actually showed signs of good granulation and some areas he had biofilm and slough buildup and others. I did perform sharp debridement to clearway the necrotic debris that today without complication postdebridement the wound bed is cleaner although it was a little larger with extension with some bruising area on the posterior aspect of the heel. Subsequently I did obtain a wound culture postdebridement to evaluate for he tolerated signs of any bacteria present at this point. Electronic Signature(s) Signed: 11/26/2022 1:51:43 PM By: Worthy Keeler PA-C Entered By: Worthy Keeler on 11/26/2022 13:51:43 -------------------------------------------------------------------------------- Physician Orders Details Patient Name: Date of Service: [name], [name] [name] 11/26/2022 1:00 PM Medical Record Number: [id number] Patient Account Number: [id number] Date of Birth/Sex: Treating RN: 1946-06-17 (77 y.o. 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 Bathing/ Shower/ Hygiene May shower; gently cleanse wound with antibacterial soap, rinse and pat dry prior to dressing wounds Edema Control - Lymphedema / Segmental Compressive Device / Other 3 Layer Compression System for Lymphedema. - left lower leg Non-Wound Condition dditional non-wound orders/instructions: - Apply 40% Urea cream to dry cracked skin on feet. A Off-Loading Open toe surgical shoe with peg assist. Medications-Please add to medication list. ntibiotics - start Bactrim P.O. A Wound Treatment Wound #10 - Calcaneus Wound Laterality: Left Prim Dressing: IODOFLEX 0.9% Cadexomer Iodine Pad 3 x Per Week/30 Days ary Discharge Instructions: Apply Iodoflex to wound bed only as directed. Secondary Dressing: Zetuvit Plus 4x8 (in/in) 3 x Per Week/30 Days Compression Wrap: 3-LAYER WRAP - Profore Lite LF 3 Multilayer Compression Bandaging System 3 x Per Week/30 Days Discharge Instructions: Apply 3 multi-layer wrap as prescribed. Laboratory Bacteria identified in Wound by Culture (MICRO) - culture obtained left calcaneus LOINC Code: 0093-8 Convenience Name: Wound culture routine Patient Medications llergies: tetracycline A Notifications Medication Indication Start End 11/26/2022 Bactrim DS DOSE 1 - oral 800 mg-160 mg tablet - 1 tablet oral twice a day x 14 days Electronic Signature(s) Signed: 11/26/2022 1:54:09 PM By: Worthy Keeler PA-C Entered By: Worthy Keeler on 11/26/2022 13:54:09 -------------------------------------------------------------------------------- Problem List Details Patient Name: Date of Service: [name], [name] [name] 11/26/2022 1:00 PM Medical Record Number:  [id number] Patient Account Number: [id number] Date of Birth/Sex: Treating RN: 1946/04/13  (77 y.o. [name] Primary Care Provider: Fulton Reek Other Clinician: Massie Kluver Referring Provider: Treating Provider/Extender: [name] in Treatment: [address] ([id number]) [phone].pdf Page 7 of 12 Active Problems ICD-10 Encounter Code Description Active Date MDM Diagnosis E11.621 Type 2 diabetes mellitus with foot ulcer 06/19/2022 No Yes 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sed 06/19/2022 No Yes E11.42 Type 2 diabetes mellitus with diabetic polyneuropathy 06/19/2022 No Yes Inactive Problems Resolved Problems Electronic Signature(s) Signed: 11/26/2022 1:06:44 PM By: Worthy Keeler PA-C Entered By: Worthy Keeler on 11/26/2022 13:06:44 -------------------------------------------------------------------------------- Progress Note Details Patient Name: Date of Service: [name], [name] [name] 11/26/2022 1:00 PM Medical Record Number: [id number] Patient Account Number: [id number] Date of Birth/Sex: Treating RN: 01-10-1946 (77 y.o. [name] Primary Care Provider: Fulton Reek Other Clinician: Massie Kluver Referring Provider: Treating Provider/Extender: Maurilio Lovely Weeks in Treatment: 69 Subjective Chief Complaint Information obtained from Patient Left heel ulcer History of Present Illness (HPI) 09/24/2020 on evaluation today patient presents today for a heel ulcer that he tells me has been present for about 2 years. He has been seeing podiatry and they have been attempting to manage this including what sounds to be a total contact cast, Unna boot, and just standard dressings otherwise as well. Most recently has been using triple antibiotic ointment. With that being said unfortunately despite everything he really has not had any significant improvement. He tells me that he cannot even really remember exactly how this began but he presumed it may  have rubbed on his shoes or something of that nature. 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 He tells me that this does seem to help. Fortunately there is no signs of active infection at this time. His most recent hemoglobin A1c was 8.1 though he knows this was this year he cannot tell me the exact time. His fluid pills currently to help with some of the lower extremity edema although he does obviously have signs of venous stasis/lymphedema. Currently there is no evidence of active infection. No fevers, chills, nausea, vomiting, or diarrhea. Patient has had fairly recent ABIs which were performed on 07/19/2020 and revealed that he has normal findings in both the ankle and toe locations bilaterally. His ABI on the right was 1.09 on the left was 1.08 with a TBI on the right of 0.88 and on the left of 0.94. Triphasic flow was noted throughout. 10/08/2020 on evaluation today patient appears to be doing pretty well in regard to his left heel currently in fact this is doing a great job and seems to be healing quite nicely. Unfortunately on his right leg he had a pile of wood that actually fell on him injuring his right leg this is somewhat erythematous has me concerned little bit about cellulitis though there is not really a good area to culture at this point. 10/24/2020 upon evaluation today patient appears to be doing well with regard to his heel ulcer. He is showing signs of improvement which is great news. His right leg is completely healed. Overall I feel like  he is doing excellent and there is no signs of infection. 11/07/2020 upon evaluation today patient appears to be doing well with regard to his heel ulcer. He tells me  that last week when he was unable to come in his wife actually thought that the wound was very close to closing if not closed. Then it began to "reopen again". I really feel like what may have happened as the MONTARIUS, KITAGAWA ([id number]) [id number] Page 8 of 12 collagen may have dried over the wound bed and that because that misunderstanding with thinking that the wound was healing. With that being said I did not see it last week I do not know that for certain. Either way I feel like he is doing great today I see no signs of infection at this point. 11/26/2020 upon inspection today patient appears to be doing decently well in regard to his heel ulcer. He has been tolerating the dressing changes without complication. Fortunately there is no sign of active infection at this time. No fevers, chills, nausea, vomiting, or diarrhea. 12/10/2020 upon evaluation today patient appears to be doing fairly well in regard to the wound on his heel as well as what appears to be a new wound of the left first metatarsal head plantar aspect. This seems to be an area that was callus that has split as the patient tells me has been trying to walk on his toes more has probably where this came from. With that being said there does not appear to be signs of active infection which is great news. 12/17/2020 upon evaluation today patient actually appears to be making good progress currently. Fortunately there is no evidence of active infection at this time. Overall I feel like he is very close to complete closure. 12/26/2020 upon evaluation today patient appears to be doing excellent in regard to his wounds. In fact I am not certain that these are not even completely healed on initial inspection. Overall I am very pleased with where things stand today. Good news is that they are healed he is actually get ready to go out of town and that will be helpful as well as he will be a full part of the  time. Readmission: 02/04/2021 upon evaluation today patient appears to be doing well at this point in regard to his left heel that I previously saw him for. Unfortunately he is having issues with his right lower extremity. He has significant wounds at this point he also has erythema noted there is definite signs of cellulitis which is unfortunate. With that being said I think we do need to address this sooner rather than later. The good news is he did have a nice trip to the beach. He tells me that he had no issues during that time. 4/27; patient on Bactrim. Culture showed methicillin sensitive staph aureus therefore the Bactrim should be effective. 2 small areas on the right leg are healed the area on the mid aspect of the tibia almost 100% covered in a very adherent necrotic debris. We have been using silver alginate 02/20/2021 upon evaluation today patient appears to be doing well with regard to his wound. He is showing signs of improvement which is great news overall very pleased with where things stand today. No fevers, chills, nausea, vomiting, or diarrhea. 02/27/2021 upon evaluation today patient appears to be doing well with regard to his leg ulcer. He is tolerating the dressing changes and overall appears to be doing quite excellent. I am extremely pleased  with where things stand and overall I think patient is making great progress. There is no sign of active infection at this time which is also great news. 03/06/2021 upon evaluation today patient appears to be doing excellent in regard to his wounds. In fact he appears to be completely healed today based on what I am seeing. This is excellent news and overall I am extremely pleased with where he stands. Overall the patient is happy to hear this as well this has been quite sometime coming. Readmission: 04/01/2021 patient unfortunately returns for readmission today. He tells me that he has been wearing his compression socks on the right leg daily  and he does not really know what is going on and why his legs are doing what they are doing. We did therefore go ahead and probe deeper into exactly what has been going on with him today. Subsequently the patient tells me that when he gets up and what we would call "first thing in the morning" are really 2 different things". He does not tend to sleep well so he tells me that he will often wake up at 3:00 in the morning. He will then potentially going into the living room to get in his chair where he may read a book for a little while and then potentially fall asleep back in his chair. He then subsequently wake up around 5:00 or so and then get up and in his words "putter around". This often will end with him proceeding at some point around 8 AM or 9 AM to putting on his compression socks. With that being said this means that anywhere from the 3:00 in the morning till roughly around 9:00 in the morning he has no compression on yet he is sitting in his recliner, walking around and up and about, and this is at least 5 to 6 hours of noncompressed time. That may be our issue here but I did not realize until we discussed this further today. Fortunately there does not appear to be any signs of active infection at this time which is great news. With that being said he has multiple wounds of the bilateral lower extremities. 04/10/2021 upon evaluation today patient appears to be doing well with regard to his legs for the most part. He does look like he had some injury where his wrap slid down nonetheless he did some "doctoring on them". I do feel like most of the areas honestly have cleared back up which is great news. There does not appear to be any signs of active infection at this time which is also great news. No fevers, chills, nausea, vomiting, or diarrhea. 04/18/2021 upon evaluation today patient appears to be doing well with regard to his wounds in fact he appears to be completely healed which is great  news. Fortunately there does not appear to be any signs of active infection which is great news. No fevers, chills, nausea, vomiting, or diarrhea. READMISSION 07/28/2021 This is a now 77 year old man we have had in this clinic for quite a bit of this year. 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 He wears compression stockings. 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 He comes in an old running shoes although he says he wears better shoes at home. His history is that he noticed a blister in the right posterior heel. This open. He has been applying Neosporin to it currently the  wound measures 2 x 1.4 cm. 100% slough covered. Not really offloading this in any rigorous way. Past medical history is essentially unchanged he has paroxysmal atrial fibrillation on Coumadin type 2 diabetes with a recent hemoglobin A1c of 7 on metformin. ABI in our clinic was 0.98 on the right 08/05/2021 upon evaluation today patient appears to be doing well with regard to his heel ulcer. Fortunately there does not appear to be any signs of active infection at this time. Overall I been very pleased with where things seem to be currently as far as the wound healing is concerned. Again this is first a lot of seeing him Dr. Dellia Nims readmitted him last week. Nonetheless I think we are definitely making some progress here. 08/11/2021 upon evaluation today patient's wound on the heel actually showing signs of excellent improvement. I am actually very pleased with where things stand currently. No fevers, chills, nausea, vomiting, or diarrhea. There does not appear to be any need for sharp debridement today either which is also great news. 08/25/2021 upon evaluation today patient appears to be doing well with regard to his heel ulcer. This is actually looking significantly improved compared to last time I saw him.  Fortunately there does not appear to be any evidence of active infection at this time. No fevers, chills, nausea, vomiting, or diarrhea. 09/01/2021 upon evaluation today patient appears to be doing decently well in regard to his wounds. Fortunately there does not appear to be any signs of active infection at this time which is great news and overall very pleased in that regard. I do not see any evidence of active infection systemically which is great news as well. No fevers, chills, nausea, vomiting, or diarrhea. I think that the heel is making excellent progress. 09/15/2021 upon evaluation today patient's heel unfortunately is significantly worse compared to what it was previous. I do believe that at this time he would benefit from switching back to something a little bit more offloading from the cushion shoe he has right now this is more of a heel protector what I really need is a Prevalon offloading boot which I think is good to do a lot better for him than just the small heel protector. CORDAE, MCCAREY ([id number]) [id number] Page 9 of 12 09/23/2021 upon evaluation today patient appears to be doing a little better in regards to last week's visit. Overall I think that he is making good progress which is great news and there does not appear to be any evidence of active infection at this time. No fevers, chills, nausea, vomiting, or diarrhea. 09/30/2021 upon evaluation patient's heel is actually showing signs of good improvement which is great news. Fortunately there does not appear to be any evidence of active infection locally nor systemically at this point. No fevers, chills, nausea, vomiting, or diarrhea. 10/07/2021 upon evaluation today patient appears to be doing well currently in regard to his heel ulcer. He has been tolerating the dressing changes without complication. Fortunately I do not see any signs of active infection at this time which is great news. No  fevers, chills, nausea, vomiting, or diarrhea. 12/27; wound is measuring smaller. We have been using silver alginate 10/28/2021 upon evaluation today patient appears to be doing well currently in regard to his heel ulcer. I am actually very pleased with where things stand and I think he is making excellent progress. Fortunately I do not see any signs of active infection locally nor systemically at this point which is great news.  Nonetheless I do believe that the patient would benefit from a new offloading shoe and has been using it Velcro is no longer functioning properly and he tells me that he almost tripped and fell because of that today. Obviously I do not want him following that would be very bad. 11/11/2021 upon evaluation today patient appears to be doing excellent in regard to his wound. In fact this appears to be completely healed based on what I see currently. I do not see any signs of anything open or draining and I did double check just by clearing away some of the callus around the edges of the wound to ensure that there was nothing still open or hiding underneath. Once this was cleared away it appeared that the patient was doing significantly better at this time which is great news and there was nothing actually open. Readmission: 06-19-2022 upon evaluation today patient appears to be doing well currently in regard to his heel ulcer all things considered. He unfortunately is having a bit of breakdown in general as far as the heel is concerned not nearly as bad as what we noted last time he was here in the clinic. The good news is I do think that this should hopefully heal much more rapidly than what we noted last time. His past medical history has not changed he is on Coumadin. 07-03-2022 upon evaluation today patient appears to be doing better in regard to his heel. We will start to see some improvement here which is good news. Fortunately I do not see any evidence of infection locally or  systemically at this time which is great news. No fevers, chills, nausea, vomiting, or diarrhea. 07-10-2022 upon evaluation today patient appears to be doing well currently in regard to his wound from the callus standpoint around the edges. Unfortunately from an actual wound bed standpoint he has some deep tissue injury noted at this point. Again I am not sure exactly what is going on that is causing this pressure to the region but he is definitely gotten pressure that is occurring and causing this to not heal as effectively as what we would like to see. I discussed with him today that he may need to at night when he sleeping use a pillow up underneath his calf in order to prevent the heel from touching the bed at all. I also think that it may be beneficial for him to monitor throughout his day and make sure there is no other time when he is getting the pressure to the heel. 07-23-2022 upon evaluation today patient appears to be doing poorly currently in regard to his heel ulcer. He has been tolerating the dressing changes without complication. Unfortunately I feel like he may be infected based on what I am seeing. 07-28-2022 I did review patient's culture results which showed positive for Proteus as well as Staphylococcus both of which would be treated with the Bactrim DS that I gave him at the last visit. This is good news and hopefully means that we should be able to apply the cast today as long as the wound itself does not appear to be doing poorly. Patient's wound bed actually showed signs of doing quite well and I am very pleased with where things stand and I do not see any signs of worsening overall which is great news. 08-04-2022 upon evaluation today patient appears to be doing well currently in regard to his heel ulcer. I am actually very pleased with where things stand and  I do think this is looking significantly better. With regard to his right shin that is also showing signs of improvement  which is great news. 08-10-2022 upon evaluation today patient's wound on the heel actually appears to be doing significantly better. In regard to the right anterior shin this is showing signs of healing it might even be completely healed but I still get a monitor I cannot get anything to fill away but also could not see where there was anything actually draining at this point. I am tending to think healed but I want a monitor 1 more week before I call it for sure. 08-17-2022 upon evaluation today patient appears to be doing well currently in regard to his heel. Fortunately he is tolerating the dressing changes without complication. I do not see any signs of infection and overall I think he is making good progress here. We did get approval for the Apligraf but I think he had a $295 co-pay that would have to be paid per application. With that being said as good as he is doing right now I do not think that is necessary but is still an option depending on how things progress if we need to speed this up quite a bit. 08/24/2022; this patient has a wound on his left heel in the setting of type 2 diabetes. We have been using Prisma. He has a heel offloading boot 08-31-2022 upon evaluation today patient appears to be doing poorly in regard to his heel ulcer which is still showing signs of bruising I am just not as happy as I was when I saw him 2 weeks ago with this. He saw Dr. Dellia Nims last week. Also did not look good at that point apparently. Nonetheless I think that the patient is still continuing to have some counterpressure getting to the wound bed unfortunately. 09-07-2022 upon evaluation today patient appears to be doing well currently in regard to his heel ulcer. He has been tolerating the dressing changes without complication. Fortunately there does not appear to be any signs of active infection locally or systemically at this time. Fortunately I do not see any evidence of active infection at this  time. 09-15-2022 upon evaluation today patient appears to be doing well currently in regard to his legs which in fact appear to be completely healed this is great news. With that being said his heel does have some erythema and warmth as well as drainage I am concerned about cellulitis at this location. 09-21-2022 upon evaluation today patient's wound is actually showing signs of being a little bit smaller but still we are not doing nearly as well as what I would like to see. Fortunately I do not see any signs of infection at this time which is great news and overall I am extremely pleased in that regard. With that being said I do think that he needs to continue to elevate his leg although the edema is under great control with a compression wrap I think switching to a different dressing topically may be beneficial. My suggestion is to switch him over to a Iodoflex dressing. 09-28-2022 upon evaluation today patient appears to be doing well with regard to his heel ulcer. This is actually showing signs of improvement which is great news and overall I am extremely pleased with where we stand today. 10-06-2022 upon evaluation today patient actually appears to be making excellent progress at this point. Fortunately there does not appear to be any signs of active infection locally nor systemically  which is great news and overall I am extremely pleased with where we stand. I do feel like he is actually making some pretty good progress here as we switch to the wrap along with the Iodoflex. 12/26; patient has a wound on the tip of his left heel. This is not a weightbearing surface. He is using a heel offloading boot and carefully offloading this at other times during the day. He is using Iodoflex and Zetuvitunder 3 layer compression 10-22-2022 upon evaluation today patient appears to be doing well currently in regard to his wound. He has been tolerating the dressing changes without complication. Fortunately there  does not appear to be any signs of active infection locally nor systemically at this time. No fevers, chills, nausea, vomiting, or diarrhea. 08-30-2023 upon evaluation today patient appears to be doing well currently in regard to his heel ulcer which is showing signs of good improvement. Fortunately I do not see any evidence of infection locally nor systemically which is great news and overall I am extremely pleased with where we stand. No fevers, chills, nausea, vomiting, or diarrhea. Unfortunately he does have a wound on the side of his fifth toe where I think this did rub on the shoe. 11-05-2022 upon evaluation today patient still still showing some signs of pressure at this point and there may have even been some fluid collection at this JERMON, CHALFANT ([id number]) [id number] Page 10 of 12 time. Fortunately I do not see any evidence of active infection locally nor systemically which is great news and overall I am extremely pleased with where we stand. No fevers, chills, nausea, vomiting, or diarrhea. 1/25; the patient's area on the dorsal aspect of the left fifth toe is healed. We have been using Iodoflex to the area on the left heel. The lateral wound is slightly down in length. 11-19-2022 upon evaluation today patient appears to be doing well currently in regard to his wound. He has been tolerating the dressing changes without complication. Fortunately there does not appear to be any signs of infection we been using Iodoflex up to this point. 11-26-2022 upon evaluation today patient appears to be doing poorly currently in regard to his wound. He has been tolerating the dressing changes with the Iodoflex without complication. With that being said he does not have any signs of active infection systemically though locally I do feel like there is some evidence of infection here. Objective Constitutional Obese and well-hydrated in no acute distress. Vitals Time Taken:  1:09 PM, Height: 70 in, Weight: 265 lbs, BMI: 38, Temperature: 97.9 F, Pulse: 66 bpm, Respiratory Rate: 18 breaths/min, Blood Pressure: 150/77 mmHg. Respiratory normal breathing without difficulty. Psychiatric this patient is able to make decisions and demonstrates good insight into disease process. Alert and Oriented x 3. pleasant and cooperative. General Notes: Upon inspection patient's wound bed actually showed signs of good granulation and some areas he had biofilm and slough buildup and others. I did perform sharp debridement to clearway the necrotic debris that today without complication postdebridement the wound bed is cleaner although it was a little larger with extension with some bruising area on the posterior aspect of the heel. Subsequently I did obtain a wound culture postdebridement to evaluate for he tolerated signs of any bacteria present at this point. Integumentary (Hair, Skin) Wound #10 status is Open. Original cause of wound was Gradually Appeared. The date acquired was: 06/05/2022. The wound has been in treatment 22 weeks. The wound is located on the Left Calcaneus.  The wound measures 1.8cm length x 2.3cm width x 0.2cm depth; 3.252cm^2 area and 0.65cm^3 volume. There is Fat Layer (Subcutaneous Tissue) exposed. There is a medium amount of serosanguineous drainage noted. The wound margin is flat and intact. There is small (1-33%) pink granulation within the wound bed. There is a large (67-100%) amount of necrotic tissue within the wound bed including Adherent Slough. 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 There was a  Excisional Skin/Subcutaneous Tissue Debridement with a total area of 4.14 sq cm performed by Tommie Sams., PA-C. With the following instrument(s): Curette to remove Viable and Non-Viable tissue/material. Material removed includes Subcutaneous Tissue, Slough, and Biofilm. A time out was conducted at 13:34, prior to the start of the procedure. A Minimum amount of bleeding was controlled with Pressure. The procedure was tolerated well. Post Debridement Measurements: 1.8cm length x 2.3cm width x 0.2cm depth; 0.65cm^3 volume. Character of Wound/Ulcer Post Debridement is stable. Severity of Tissue Post Debridement is: Fat layer exposed. 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 There was a Three Layer Compression Therapy Procedure with a pre-treatment ABI of 1 by Massie Kluver. Post procedure Diagnosis Wound #10: Same as Pre-Procedure Plan GARRIT, MARROW ([id number]) [id number] Page 11 of 12 Follow-up Appointments: Wound #10 Left Calcaneus: Return Appointment in 1 week. Bathing/ Shower/ Hygiene: May shower; gently cleanse wound with antibacterial soap, rinse and pat dry prior to dressing wounds Edema Control - Lymphedema / Segmental Compressive Device / Other: 3 Layer Compression System for Lymphedema. - left lower leg Non-Wound Condition: Additional non-wound orders/instructions: - Apply 40% Urea cream to dry cracked skin on feet. Off-Loading: Open toe surgical shoe with peg assist. Medications-Please add to medication list.: P.O. Antibiotics - start Bactrim Laboratory ordered were: Wound culture routine - culture obtained left calcaneus The following medication(s) was prescribed: Bactrim DS oral 800 mg-160 mg tablet 1 1 tablet oral twice a day x 14 days starting 11/26/2022 WOUND #10: - Calcaneus Wound Laterality: Left Prim  Dressing: IODOFLEX 0.9% Cadexomer Iodine Pad 3 x Per Week/30 Days ary Discharge Instructions: Apply Iodoflex to wound bed only as directed. Secondary Dressing: Zetuvit Plus 4x8 (in/in) 3 x Per Week/30 Days Com pression Wrap: 3-LAYER WRAP - Profore Lite LF 3 Multilayer Compression Bandaging System 3 x Per Week/30 Days Discharge Instructions: Apply 3 multi-layer wrap as prescribed. 1. I am going to recommend currently that we have the patient continue to monitor for any signs of infection or worsening such as increased pain right now is tell me he is really not hurting which is good news. 2. I am good to go ahead and continue with the Iodoflex along with the Zetuvit the 3 layer compression wrap. 3. I am also going to go ahead and place the patient on a prescription for Bactrim DS. I discussed with him that I think this is probably the best way to go and that he has done well with this in the past we had treated him back in November. Nonetheless I am also obtaining a wound culture and depending on the results of  the culture will make any adjustments in care as necessary going forward. 4. I am also going to recommend he should continue with appropriate offloading. He is also had a lot of falls recently and he needs to be very careful in that regard We will see patient back for reevaluation in 1 week here in the clinic. If anything worsens or changes patient will contact our office for additional recommendations. Electronic Signature(s) Signed: 11/26/2022 1:54:35 PM By: Worthy Keeler PA-C Entered By: Worthy Keeler on 11/26/2022 13:54:35 -------------------------------------------------------------------------------- SuperBill Details Patient Name: Date of Service: [name], [name] [name] 11/26/2022 Medical Record Number: [id number] Patient Account Number: [id number] Date of Birth/Sex: Treating RN: 31-Mar-1946 (77 y.o. [name] Primary Care Provider: Fulton Reek Other Clinician: Massie Kluver Referring Provider: Treating Provider/Extender: Maurilio Lovely Weeks in Treatment: 22 Diagnosis Coding ICD-10 Codes Code Description E11.621 Type 2 diabetes mellitus with foot ulcer 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sed E11.42 Type 2 diabetes mellitus with diabetic polyneuropathy Facility Procedures : [name], [name] Code: [id number] KHYREE CARILLO ([id number]) L Description: 97282 - DEB SUBQ TISSUE 20 SQ CM/< ICD-10 Diagnosis Description [id number] 97.522 Non-pressure chronic ulcer of other part of left foot with fat layer exposed Modifier: [id number] Page Quantity: 1 12 of 12 Physician Procedures : CPT4 Code Description Modifier [id number] 73403 - WC PHYS LEVEL 4 - EST PT 25 ICD-10 Diagnosis Description E11.621 Type 2 diabetes mellitus with foot ulcer 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sed E11.42 Type 2  diabetes mellitus with diabetic polyneuropathy Quantity: 1 : [id number] 11042 - WC PHYS SUBQ TISS 20 SQ CM ICD-10 Diagnosis Description 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sed Quantity: 1 Electronic Signature(s) Signed: 11/26/2022 1:54:53 PM By: Worthy Keeler PA-C Entered By: Worthy Keeler on 11/26/2022 13:54:52

## 2022-11-29 LAB — AEROBIC CULTURE W GRAM STAIN (SUPERFICIAL SPECIMEN): Gram Stain: NONE SEEN

## 2022-12-03 ENCOUNTER — Encounter: Payer: Medicare HMO | Admitting: Physician Assistant

## 2022-12-03 DIAGNOSIS — E11621 Type 2 diabetes mellitus with foot ulcer: Secondary | ICD-10-CM | POA: Diagnosis not present

## 2022-12-04 NOTE — Progress Notes (Signed)
HEBER, HOOG ([id number]) [id number] Page 1 of 6 Visit Report for 12/03/2022 Arrival Information Details Patient Name: Date of Service: [name], [name] [name] 12/03/2022 1:00 PM Medical Record Number: [id number] Patient Account Number: [id number] Date of Birth/Sex: Treating RN: [birth year] (77 y.o. [name] Primary Care [name]: Fulton Reek Other Clinician: Massie Kluver Referring [name]: Treating Zenna Traister/Extender: [name] in Treatment: 23 Visit Information History Since Last Visit All ordered tests and consults were completed: No Patient Arrived: [name] Added or deleted any medications: No Arrival Time: 13:08 Any new allergies or adverse reactions: No Transfer Assistance: None Had a fall or experienced change in No Patient Identification Verified: Yes activities of daily living that may affect Secondary Verification Process Completed: Yes risk of falls: Patient Requires Transmission-Based Precautions: No Signs or symptoms of abuse/neglect since No Patient Has Alerts: Yes last visito Patient Alerts: Patient on Blood Thinner Hospitalized since last visit: No Warfarin Implantable device outside of the clinic No Type II Diabetic excluding cellular tissue based products placed in the center since last visit: Has Dressing in Place as Prescribed: Yes Has Compression in Place as Prescribed: Yes Has Footwear/Offloading in Place as Yes Prescribed: Left: Surgical Shoe with Pressure Relief Insole Pain Present Now: No Electronic Signature(s) Unsigned Entered ByMassie Kluver on 12/03/2022 13:09:17 -------------------------------------------------------------------------------- Clinic Level of Care Assessment Details Patient Name: Date of Service: [name], [name] [name] 12/03/2022 1:00 PM Medical Record Number: [id number] Patient Account Number: [id number] Date of Birth/Sex: Treating RN: 04-Aug-1946 (77 y.o. [name] Primary Care [name]: Fulton Reek Other Clinician: Massie Kluver Referring [name]: Treating Azam Gervasi/Extender: [name] in Treatment: [address] ([id number]) [id number] Page 2 of 6 Clinic Level of Care Assessment Items TOOL 1 Quantity Score []  - 0 Use when EandM and Procedure is performed on INITIAL visit ASSESSMENTS - Nursing Assessment / Reassessment []  - 0 General Physical Exam (combine w/ comprehensive assessment (listed just below) when performed on new pt. evals) []  - 0 Comprehensive Assessment (HX, ROS, Risk Assessments, Wounds Hx, etc.) ASSESSMENTS - Wound and Skin Assessment / Reassessment []  - 0 Dermatologic / Skin Assessment (not related to wound area) ASSESSMENTS - Ostomy and/or Continence Assessment and Care []  - 0 Incontinence Assessment and Management []  - 0 Ostomy Care Assessment and Management (repouching, etc.) PROCESS - Coordination of Care []  - 0 Simple Patient / Family Education for ongoing care []  - 0 Complex (extensive) Patient / Family Education for ongoing care []  - 0 Staff obtains Programmer, systems, Records, T Results / Process Orders est []  - 0 Staff telephones HHA, Nursing Homes / Clarify orders / etc []  - 0 Routine Transfer to another Facility (non-emergent condition) []  - 0 Routine Hospital Admission (non-emergent condition) []  - 0 New Admissions / Biomedical engineer / Ordering NPWT Apligraf, etc. , []  - 0 Emergency Hospital Admission (emergent condition) PROCESS - Special Needs []  - 0 Pediatric / Minor Patient Management []  - 0 Isolation Patient Management []  - 0 Hearing / Language / Visual special needs []  - 0 Assessment of Community assistance (transportation, D/C planning, etc.) []  - 0 Additional assistance / Altered mentation []  - 0 Support Surface(s) Assessment (bed, cushion, seat, etc.) INTERVENTIONS - Miscellaneous []  - 0 External ear exam []  -  0 Patient Transfer (multiple staff / Civil Service fast streamer / Similar devices) []  - 0 Simple Staple / Suture removal (25 or less) []  - 0 Complex Staple / Suture removal (26 or more) []  - 0  Hypo/Hyperglycemic Management (do not check if billed separately) []  - 0 Ankle / Brachial Index (ABI) - do not check if billed separately Has the patient been seen at the hospital within the last three years: Yes Total Score: 0 Level Of Care: ____ Electronic Signature(s) Unsigned Entered By: Massie Kluver on 12/03/2022 13:56:23 Signature(s): [name], [name] ([id number]) [phone] Date(s): [id number] Page 3 of 6 -------------------------------------------------------------------------------- Lower Extremity Assessment Details Patient Name: Date of Service: [name], [name] [name] 12/03/2022 1:00 PM Medical Record Number: [id number] Patient Account Number: [id number] Date of Birth/Sex: Treating RN: 17-Mar-1946 (77 y.o. [name] Primary Care [name]: Fulton Reek Other Clinician: Massie Kluver Referring [name]: Treating [name]/Extender: [name] Weeks in Treatment: 23 Edema Assessment Assessed: [Left: Yes] [Right: No] Edema: [Left: Ye] [Right: s] Calf Left: Right: Point of Measurement: 33 cm From Medial Instep 39.8 cm Ankle Left: Right: Point of Measurement: 11 cm From Medial Instep 22.9 cm Vascular Assessment Pulses: Dorsalis Pedis Palpable: [Left:Yes] Electronic Signature(s) Signed: 12/04/2022 1:08:07 PM By: [name], BSN, RN, CWS, [name] RN, BSN Entered By: Massie Kluver on 12/03/2022 13:22:15 -------------------------------------------------------------------------------- Multi Wound Chart Details Patient Name: Date of Service: [name], [name] [name] 12/03/2022 1:00 PM Medical Record Number: [id number] Patient Account Number: [id number] Date of Birth/Sex: Treating RN: 25-Dec-1945 (77 y.o. [name] Primary Care [name]: Fulton Reek Other  Clinician: Massie Kluver Referring [name]: Treating [name]/Extender: [name] Weeks in Treatment: 23 Vital Signs Height(in): 70 Pulse(bpm): 80 Weight(lbs): 265 Blood Pressure(mmHg): 172/74 Body Mass Index(BMI): 38 Temperature(F): 98.1 Respiratory Rate(breaths/min): [address] ([id number]) [id number].pdf Page 4 of 6 [10:Photos:] [N/A:N/A] Left Calcaneus N/A N/A Wound Location: Gradually Appeared N/A N/A Wounding 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 N/A N/A Secondary Etiology: Cataracts, Arrhythmia, Coronary N/A N/A Comorbid History: Artery Disease, Hypertension, Type II Diabetes, Osteoarthritis, Neuropathy 06/05/2022 N/A N/A Date Acquired: 66 N/A N/A Weeks of Treatment: Open N/A N/A Wound Status: No N/A N/A Wound Recurrence: 1.7x3x0.2 N/A N/A Measurements L x W x D (cm) 4.006 N/A N/A A (cm) : rea 0.801 N/A N/A Volume (cm) : -183.30% N/A N/A % Reduction in A rea: -183.00% N/A N/A % Reduction in Volume: Grade 1 N/A N/A Classification: Medium N/A N/A Exudate A mount: Serosanguineous N/A N/A Exudate Type: red, brown N/A N/A Exudate Color: Flat and Intact N/A N/A Wound Margin: Small (1-33%) N/A N/A Granulation A mount: Pink N/A N/A Granulation Quality: Large (67-100%) N/A N/A Necrotic A mount: Fat Layer (Subcutaneous Tissue): Yes N/A N/A Exposed Structures: Fascia: No Tendon: No Muscle: No Joint: No Bone: No Small (1-33%) N/A N/A Epithelialization: Treatment Notes Electronic Signature(s) Unsigned Entered ByMassie Kluver on 12/03/2022 13:22:31 -------------------------------------------------------------------------------- Pain Assessment Details Patient Name: Date of Service: [name], [name] [name] 12/03/2022 1:00 PM Medical Record Number: [id number] Patient Account Number: [id number] Date of Birth/Sex: Treating RN: [birth year] (77  y.o. [name] Primary Care Baby Gieger: Fulton Reek Other Clinician: Massie Kluver Referring Zanna Hawn: Treating Taiya Nutting/Extender: [name] in Treatment: [address] RAYBON, CONARD ([id number]) [id number] Page 5 of 6 Location of Pain Severity and Description of Pain Patient Has Paino No Site Locations Pain Management and Medication Current Pain Management: Electronic Signature(s) Signed: 12/04/2022 1:08:07 PM By: [name], BSN, RN, CWS, [name] RN, BSN Entered By: Massie Kluver on 12/03/2022 13:12:11 -------------------------------------------------------------------------------- Wound Assessment Details Patient Name: Date of Service: [name], [name] [name] 12/03/2022 1:00 PM Medical Record Number: [id number] Patient Account  Number: [id number] Date of Birth/Sex: Treating RN: May 17, 1946 (77 y.o. [name] Primary Care Llesenia Fogal: Fulton Reek Other Clinician: Massie Kluver Referring Myson Levi: Treating Virga Haltiwanger/Extender: [name] Weeks in Treatment: 23 Wound Status Wound Number: 10 Primary Diabetic Wound/Ulcer of the Lower Extremity Etiology: Wound Location: Left Calcaneus Secondary Pressure Ulcer Wounding Event: Gradually Appeared Etiology: Date Acquired: 06/05/2022 Wound Open Weeks Of Treatment: 23 Status: Clustered Wound: No Comorbid Cataracts, Arrhythmia, Coronary Artery Disease, Hypertension, History: Type II Diabetes, Osteoarthritis, Neuropathy Photos ISAY, PERLEBERG ([id number]) [phone].pdf Page 6 of 6 Wound Measurements Length: (cm) 1.7 Width: (cm) 3 Depth: (cm) 0.2 Area: (cm) 4.006 Volume: (cm) 0.801 % Reduction in Area: -183.3% % Reduction in Volume: -183% Epithelialization: Small (1-33%) Wound Description Classification: Grade 1 Wound Margin: Flat and Intact Exudate Amount: Medium Exudate Type: Serosanguineous Exudate Color: red, brown Foul  Odor After Cleansing: No Slough/Fibrino Yes Wound Bed Granulation Amount: Small (1-33%) Exposed Structure Granulation Quality: Pink Fascia Exposed: No Necrotic Amount: Large (67-100%) Fat Layer (Subcutaneous Tissue) Exposed: Yes Necrotic Quality: Adherent Slough Tendon Exposed: No Muscle Exposed: No Joint Exposed: No Bone Exposed: No Electronic Signature(s) Signed: 12/04/2022 1:08:07 PM By: [name], BSN, RN, CWS, [name] RN, BSN Entered By: Massie Kluver on 12/03/2022 13:21:08 -------------------------------------------------------------------------------- Vitals Details Patient Name: Date of Service: [name], [name] [name] 12/03/2022 1:00 PM Medical Record Number: [id number] Patient Account Number: [id number] Date of Birth/Sex: Treating RN: Apr 24, 1946 (77 y.o. [name], [name] Primary Care Ava Tangney: Fulton Reek Other Clinician: Massie Kluver Referring Cordell Guercio: Treating Nafeesah Lapaglia/Extender: [name] Weeks in Treatment: 23 Vital Signs Time Taken: 13:09 Temperature (F): 98.1 Height (in): 70 Pulse (bpm): 80 Weight (lbs): 265 Respiratory Rate (breaths/min): 18 Body Mass Index (BMI): 38 Blood Pressure (mmHg): 172/74 Reference Range: 80 - 120 mg / dl Electronic Signature(s) Unsigned Entered ByMassie Kluver on 12/03/2022 13:12:03 Signature(s): Date(s):

## 2022-12-04 NOTE — Progress Notes (Signed)
[name], [name] ([id number]) [id number] Page 1 of 11 Visit Report for 12/03/2022 Chief Complaint Document Details Patient Name: Date of Service: [name], [name] [name] 12/03/2022 1:00 PM Medical Record Number: [id number] Patient Account Number: [id number] Date of Birth/Sex: Treating RN: 1946-08-26 (77 y.o. [name] Primary Care Provider: Fulton Reek Other Clinician: Massie Kluver Referring Provider: Treating Provider/Extender: Maurilio Lovely Weeks in Treatment: 23 Information Obtained from: Patient Chief Complaint Left heel ulcer Electronic Signature(s) Signed: 12/03/2022 1:07:36 PM By: Worthy Keeler PA-C Entered By: Worthy Keeler on 12/03/2022 13:07:36 -------------------------------------------------------------------------------- Debridement Details Patient Name: Date of Service: [name], [name] [name] 12/03/2022 1:00 PM Medical Record Number: [id number] Patient Account Number: [id number] Date of Birth/Sex: Treating RN: [birth year] (77 y.o. [name] Primary Care Provider: Fulton Reek Other Clinician: Massie Kluver Referring Provider: Treating Provider/Extender: [name] in Treatment: 23 Debridement Performed for Assessment: Wound #10 Left Calcaneus Performed By: Physician Tommie Sams., PA-C Debridement Type: Debridement Severity of Tissue Pre Debridement: Fat layer exposed Level of Consciousness (Pre-procedure): Awake and Alert Pre-procedure Verification/Time Out Yes - 13:52 Taken: Start Time: 13:52 T Area Debrided (L x W): otal 1.7 (cm) x 3 (cm) = 5.1 (cm) Tissue and other material debrided: Viable, Non-Viable, Slough, Subcutaneous, Biofilm, Slough Level: Skin/Subcutaneous Tissue Debridement Description: Excisional Instrument: Curette Bleeding: Minimum Hemostasis Achieved: Pressure Response to Treatment: Procedure was tolerated well Level of Consciousness (Post- Awake and  Alert procedure): FIELDS, OROS ([id number]) [id number] Page 2 of 11 Post Debridement Measurements of Total Wound Length: (cm) 1.7 Width: (cm) 3 Depth: (cm) 0.2 Volume: (cm) 0.801 Character of Wound/Ulcer Post Debridement: Stable Severity of Tissue Post Debridement: Fat layer exposed Post Procedure Diagnosis Same as Pre-procedure Electronic Signature(s) Signed: 12/03/2022 2:34:50 PM By: Worthy Keeler PA-C Signed: 12/04/2022 1:08:07 PM By: [name], BSN, RN, CWS, [name] RN, BSN Entered By: Worthy Keeler on 12/03/2022 14:34:50 -------------------------------------------------------------------------------- HPI Details Patient Name: Date of Service: [name], [name] [name] 12/03/2022 1:00 PM Medical Record Number: [id number] Patient Account Number: [id number] Date of Birth/Sex: Treating RN: 1946/08/20 (77 y.o. [name] Primary Care Provider: Fulton Reek Other Clinician: Massie Kluver Referring Provider: Treating Provider/Extender: [name] in Treatment: 23 History of Present Illness HPI Description: 09/24/2020 on evaluation today patient presents today for a heel ulcer that he tells me has been present for about 2 years. He has been seeing podiatry and they have been attempting to manage this including what sounds to be a total contact cast, Unna boot, and just standard dressings otherwise as well. Most recently has been using triple antibiotic ointment. With that being said unfortunately despite everything he really has not had any significant improvement. He tells me that he cannot even really remember exactly how this began but he presumed it may have rubbed on his shoes or something of that nature. 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 He also does have chronic  pain in the way of neuropathy which he takes medications for including Cymbalta and methadone. He tells me that this does seem to help. Fortunately there is no signs of active infection at this time. His most recent hemoglobin A1c was 8.1 though he knows this was this year he cannot tell me the exact time. His fluid pills currently to help  with some of the lower extremity edema although he does obviously have signs of venous stasis/lymphedema. Currently there is no evidence of active infection. No fevers, chills, nausea, vomiting, or diarrhea. Patient has had fairly recent ABIs which were performed on 07/19/2020 and revealed that he has normal findings in both the ankle and toe locations bilaterally. His ABI on the right was 1.09 on the left was 1.08 with a TBI on the right of 0.88 and on the left of 0.94. Triphasic flow was noted throughout. 10/08/2020 on evaluation today patient appears to be doing pretty well in regard to his left heel currently in fact this is doing a great job and seems to be healing quite nicely. Unfortunately on his right leg he had a pile of wood that actually fell on him injuring his right leg this is somewhat erythematous has me concerned little bit about cellulitis though there is not really a good area to culture at this point. 10/24/2020 upon evaluation today patient appears to be doing well with regard to his heel ulcer. He is showing signs of improvement which is great news. His right leg is completely healed. Overall I feel like he is doing excellent and there is no signs of infection. 11/07/2020 upon evaluation today patient appears to be doing well with regard to his heel ulcer. He tells me that last week when he was unable to come in his wife actually thought that the wound was very close to closing if not closed. Then it began to "reopen again". I really feel like what may have happened as the collagen may have dried over the wound bed and that because that  misunderstanding with thinking that the wound was healing. With that being said I did not see it last week I do not know that for certain. Either way I feel like he is doing great today I see no signs of infection at this point. 11/26/2020 upon inspection today patient appears to be doing decently well in regard to his heel ulcer. He has been tolerating the dressing changes without complication. Fortunately there is no sign of active infection at this time. No fevers, chills, nausea, vomiting, or diarrhea. 12/10/2020 upon evaluation today patient appears to be doing fairly well in regard to the wound on his heel as well as what appears to be a new wound of the left first metatarsal head plantar aspect. This seems to be an area that was callus that has split as the patient tells me has been trying to walk on his toes more has probably where this came from. With that being said there does not appear to be signs of active infection which is great news. 12/17/2020 upon evaluation today patient actually appears to be making good progress currently. Fortunately there is no evidence of active infection at this time. Overall I feel like he is very close to complete closure. 12/26/2020 upon evaluation today patient appears to be doing excellent in regard to his wounds. In fact I am not certain that these are not even completely healed on initial inspection. Overall I am very pleased with where things stand today. Good news is that they are healed he is actually get ready to go out of town and that will be helpful as well as he will be a full part of the time. SATVIK, PARCO ([id number]) [id number] Page 3 of 11 Readmission: 02/04/2021 upon evaluation today patient appears to be doing well at this point in regard to his left heel  that I previously saw him for. Unfortunately he is having issues with his right lower extremity. He has significant wounds at this point he also has erythema  noted there is definite signs of cellulitis which is unfortunate. With that being said I think we do need to address this sooner rather than later. The good news is he did have a nice trip to the beach. He tells me that he had no issues during that time. 4/27; patient on Bactrim. Culture showed methicillin sensitive staph aureus therefore the Bactrim should be effective. 2 small areas on the right leg are healed the area on the mid aspect of the tibia almost 100% covered in a very adherent necrotic debris. We have been using silver alginate 02/20/2021 upon evaluation today patient appears to be doing well with regard to his wound. He is showing signs of improvement which is great news overall very pleased with where things stand today. No fevers, chills, nausea, vomiting, or diarrhea. 02/27/2021 upon evaluation today patient appears to be doing well with regard to his leg ulcer. He is tolerating the dressing changes and overall appears to be doing quite excellent. I am extremely pleased with where things stand and overall I think patient is making great progress. There is no sign of active infection at this time which is also great news. 03/06/2021 upon evaluation today patient appears to be doing excellent in regard to his wounds. In fact he appears to be completely healed today based on what I am seeing. This is excellent news and overall I am extremely pleased with where he stands. Overall the patient is happy to hear this as well this has been quite sometime coming. Readmission: 04/01/2021 patient unfortunately returns for readmission today. He tells me that he has been wearing his compression socks on the right leg daily and he does not really know what is going on and why his legs are doing what they are doing. We did therefore go ahead and probe deeper into exactly what has been going on with him today. Subsequently the patient tells me that when he gets up and what we would call "first thing in  the morning" are really 2 different things". He does not tend to sleep well so he tells me that he will often wake up at 3:00 in the morning. He will then potentially going into the living room to get in his chair where he may read a book for a little while and then potentially fall asleep back in his chair. He then subsequently wake up around 5:00 or so and then get up and in his words "putter around". This often will end with him proceeding at some point around 8 AM or 9 AM to putting on his compression socks. With that being said this means that anywhere from the 3:00 in the morning till roughly around 9:00 in the morning he has no compression on yet he is sitting in his recliner, walking around and up and about, and this is at least 5 to 6 hours of noncompressed time. That may be our issue here but I did not realize until we discussed this further today. Fortunately there does not appear to be any signs of active infection at this time which is great news. With that being said he has multiple wounds of the bilateral lower extremities. 04/10/2021 upon evaluation today patient appears to be doing well with regard to his legs for the most part. He does look like he had some injury  where his wrap slid down nonetheless he did some "doctoring on them". I do feel like most of the areas honestly have cleared back up which is great news. There does not appear to be any signs of active infection at this time which is also great news. No fevers, chills, nausea, vomiting, or diarrhea. 04/18/2021 upon evaluation today patient appears to be doing well with regard to his wounds in fact he appears to be completely healed which is great news. Fortunately there does not appear to be any signs of active infection which is great news. No fevers, chills, nausea, vomiting, or diarrhea. READMISSION 07/28/2021 This is a now 77 year old man we have had in this clinic for quite a bit of this year. He has been in here with  bilateral lower extremity leg wounds probably secondary to chronic venous insufficiency. He wears compression stockings. 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 He comes in an old running shoes although he says he wears better shoes at home. His history is that he noticed a blister in the right posterior heel. This open. He has been applying Neosporin to it currently the wound measures 2 x 1.4 cm. 100% slough covered. Not really offloading this in any rigorous way. Past medical history is essentially unchanged he has paroxysmal atrial fibrillation on Coumadin type 2 diabetes with a recent hemoglobin A1c of 7 on metformin. ABI in our clinic was 0.98 on the right 08/05/2021 upon evaluation today patient appears to be doing well with regard to his heel ulcer. Fortunately there does not appear to be any signs of active infection at this time. Overall I been very pleased with where things seem to be currently as far as the wound healing is concerned. Again this is first a lot of seeing him Dr. Dellia Nims readmitted him last week. Nonetheless I think we are definitely making some progress here. 08/11/2021 upon evaluation today patient's wound on the heel actually showing signs of excellent improvement. I am actually very pleased with where things stand currently. No fevers, chills, nausea, vomiting, or diarrhea. There does not appear to be any need for sharp debridement today either which is also great news. 08/25/2021 upon evaluation today patient appears to be doing well with regard to his heel ulcer. This is actually looking significantly improved compared to last time I saw him. Fortunately there does not appear to be any evidence of active infection at this time. No fevers, chills, nausea, vomiting, or diarrhea. 09/01/2021 upon evaluation today patient appears to be doing decently well in regard to his wounds. Fortunately there does not appear to be any signs  of active infection at this time which is great news and overall very pleased in that regard. I do not see any evidence of active infection systemically which is great news as well. No fevers, chills, nausea, vomiting, or diarrhea. I think that the heel is making excellent progress. 09/15/2021 upon evaluation today patient's heel unfortunately is significantly worse compared to what it was previous. I do believe that at this time he would benefit from switching back to something a little bit more offloading from the cushion shoe he has right now this is more of a heel protector what I really need is a Prevalon offloading boot which I think is good to do a lot better for him than just the small heel protector. 09/23/2021 upon evaluation today patient appears to be doing a little better in regards to last week's visit.  Overall I think that he is making good progress which is great news and there does not appear to be any evidence of active infection at this time. No fevers, chills, nausea, vomiting, or diarrhea. 09/30/2021 upon evaluation patient's heel is actually showing signs of good improvement which is great news. Fortunately there does not appear to be any evidence of active infection locally nor systemically at this point. No fevers, chills, nausea, vomiting, or diarrhea. 10/07/2021 upon evaluation today patient appears to be doing well currently in regard to his heel ulcer. He has been tolerating the dressing changes without complication. Fortunately I do not see any signs of active infection at this time which is great news. No fevers, chills, nausea, vomiting, or diarrhea. 12/27; wound is measuring smaller. We have been using silver alginate 10/28/2021 upon evaluation today patient appears to be doing well currently in regard to his heel ulcer. I am actually very pleased with where things stand and I think he is making excellent progress. Fortunately I do not see any signs of active infection  locally nor systemically at this point which is great news. Nonetheless I do believe that the patient would benefit from a new offloading shoe and has been using it Velcro is no longer functioning properly and he tells me that he almost tripped and fell because of that today. Obviously I do not want him following that would be very bad. 11/11/2021 upon evaluation today patient appears to be doing excellent in regard to his wound. In fact this appears to be completely healed based on what I see currently. I do not see any signs of anything open or draining and I did double check just by clearing away some of the callus around the edges of the wound [name], [name] ([id number]) [id number] Page 4 of 11 to ensure that there was nothing still open or hiding underneath. Once this was cleared away it appeared that the patient was doing significantly better at this time which is great news and there was nothing actually open. Readmission: 06-19-2022 upon evaluation today patient appears to be doing well currently in regard to his heel ulcer all things considered. He unfortunately is having a bit of breakdown in general as far as the heel is concerned not nearly as bad as what we noted last time he was here in the clinic. The good news is I do think that this should hopefully heal much more rapidly than what we noted last time. His past medical history has not changed he is on Coumadin. 07-03-2022 upon evaluation today patient appears to be doing better in regard to his heel. We will start to see some improvement here which is good news. Fortunately I do not see any evidence of infection locally or systemically at this time which is great news. No fevers, chills, nausea, vomiting, or diarrhea. 07-10-2022 upon evaluation today patient appears to be doing well currently in regard to his wound from the callus standpoint around the edges. Unfortunately from an actual wound bed standpoint  he has some deep tissue injury noted at this point. Again I am not sure exactly what is going on that is causing this pressure to the region but he is definitely gotten pressure that is occurring and causing this to not heal as effectively as what we would like to see. I discussed with him today that he may need to at night when he sleeping use a pillow up underneath his calf in order to prevent the heel from  touching the bed at all. I also think that it may be beneficial for him to monitor throughout his day and make sure there is no other time when he is getting the pressure to the heel. 07-23-2022 upon evaluation today patient appears to be doing poorly currently in regard to his heel ulcer. He has been tolerating the dressing changes without complication. Unfortunately I feel like he may be infected based on what I am seeing. 07-28-2022 I did review patient's culture results which showed positive for Proteus as well as Staphylococcus both of which would be treated with the Bactrim DS that I gave him at the last visit. This is good news and hopefully means that we should be able to apply the cast today as long as the wound itself does not appear to be doing poorly. Patient's wound bed actually showed signs of doing quite well and I am very pleased with where things stand and I do not see any signs of worsening overall which is great news. 08-04-2022 upon evaluation today patient appears to be doing well currently in regard to his heel ulcer. I am actually very pleased with where things stand and I do think this is looking significantly better. With regard to his right shin that is also showing signs of improvement which is great news. 08-10-2022 upon evaluation today patient's wound on the heel actually appears to be doing significantly better. In regard to the right anterior shin this is showing signs of healing it might even be completely healed but I still get a monitor I cannot get anything to  fill away but also could not see where there was anything actually draining at this point. I am tending to think healed but I want a monitor 1 more week before I call it for sure. 08-17-2022 upon evaluation today patient appears to be doing well currently in regard to his heel. Fortunately he is tolerating the dressing changes without complication. I do not see any signs of infection and overall I think he is making good progress here. We did get approval for the Apligraf but I think he had a $295 co-pay that would have to be paid per application. With that being said as good as he is doing right now I do not think that is necessary but is still an option depending on how things progress if we need to speed this up quite a bit. 08/24/2022; this patient has a wound on his left heel in the setting of type 2 diabetes. We have been using Prisma. He has a heel offloading boot 08-31-2022 upon evaluation today patient appears to be doing poorly in regard to his heel ulcer which is still showing signs of bruising I am just not as happy as I was when I saw him 2 weeks ago with this. He saw Dr. Dellia Nims last week. Also did not look good at that point apparently. Nonetheless I think that the patient is still continuing to have some counterpressure getting to the wound bed unfortunately. 09-07-2022 upon evaluation today patient appears to be doing well currently in regard to his heel ulcer. He has been tolerating the dressing changes without complication. Fortunately there does not appear to be any signs of active infection locally or systemically at this time. Fortunately I do not see any evidence of active infection at this time. 09-15-2022 upon evaluation today patient appears to be doing well currently in regard to his legs which in fact appear to be completely healed this is  great news. With that being said his heel does have some erythema and warmth as well as drainage I am concerned about cellulitis at this  location. 09-21-2022 upon evaluation today patient's wound is actually showing signs of being a little bit smaller but still we are not doing nearly as well as what I would like to see. Fortunately I do not see any signs of infection at this time which is great news and overall I am extremely pleased in that regard. With that being said I do think that he needs to continue to elevate his leg although the edema is under great control with a compression wrap I think switching to a different dressing topically may be beneficial. My suggestion is to switch him over to a Iodoflex dressing. 09-28-2022 upon evaluation today patient appears to be doing well with regard to his heel ulcer. This is actually showing signs of improvement which is great news and overall I am extremely pleased with where we stand today. 10-06-2022 upon evaluation today patient actually appears to be making excellent progress at this point. Fortunately there does not appear to be any signs of active infection locally nor systemically which is great news and overall I am extremely pleased with where we stand. I do feel like he is actually making some pretty good progress here as we switch to the wrap along with the Iodoflex. 12/26; patient has a wound on the tip of his left heel. This is not a weightbearing surface. He is using a heel offloading boot and carefully offloading this at other times during the day. He is using Iodoflex and Zetuvitunder 3 layer compression 10-22-2022 upon evaluation today patient appears to be doing well currently in regard to his wound. He has been tolerating the dressing changes without complication. Fortunately there does not appear to be any signs of active infection locally nor systemically at this time. No fevers, chills, nausea, vomiting, or diarrhea. 08-30-2023 upon evaluation today patient appears to be doing well currently in regard to his heel ulcer which is showing signs of good improvement.  Fortunately I do not see any evidence of infection locally nor systemically which is great news and overall I am extremely pleased with where we stand. No fevers, chills, nausea, vomiting, or diarrhea. Unfortunately he does have a wound on the side of his fifth toe where I think this did rub on the shoe. 11-05-2022 upon evaluation today patient still still showing some signs of pressure at this point and there may have even been some fluid collection at this time. Fortunately I do not see any evidence of active infection locally nor systemically which is great news and overall I am extremely pleased with where we stand. No fevers, chills, nausea, vomiting, or diarrhea. 1/25; the patient's area on the dorsal aspect of the left fifth toe is healed. We have been using Iodoflex to the area on the left heel. The lateral wound is slightly down in length. 11-19-2022 upon evaluation today patient appears to be doing well currently in regard to his wound. He has been tolerating the dressing changes without complication. Fortunately there does not appear to be any signs of infection we been using Iodoflex up to this point. 11-26-2022 upon evaluation today patient appears to be doing poorly currently in regard to his wound. He has been tolerating the dressing changes with the Iodoflex without complication. With that being said he does not have any signs of active infection systemically though locally I do feel like  there is some evidence of infection here. 12-03-2022 upon evaluation today patient appears to be doing well currently in regard to his wound this is better with the Bactrim compared to last week still there was some significant slough and biofilm buildup which is going require sharp debridement at this point. Fortunately I do not see any signs of active infection locally nor systemically which is great news. [name], [name] ([id number]) [id number] Page 5 of 11 Electronic  Signature(s) Signed: 12/03/2022 2:32:58 PM By: Worthy Keeler PA-C Entered By: Worthy Keeler on 12/03/2022 14:32:58 -------------------------------------------------------------------------------- Physical Exam Details Patient Name: Date of Service: [name], [name] [name] 12/03/2022 1:00 PM Medical Record Number: [id number] Patient Account Number: [id number] Date of Birth/Sex: Treating RN: 1946-03-21 (77 y.o. [name] Primary Care Provider: Fulton Reek Other Clinician: Massie Kluver Referring Provider: Treating Provider/Extender: Maurilio Lovely Weeks in Treatment: 26 Constitutional Well-nourished and well-hydrated in no acute distress. Respiratory normal breathing without difficulty. Psychiatric this patient is able to make decisions and demonstrates good insight into disease process. Alert and Oriented x 3. pleasant and cooperative. Notes Upon inspection patient's wound bed actually showed signs of good granulation epithelization at this point. Fortunately I do not see anything that seems to be worsening significantly which is great news and in general I do think that we are on the right track. Nonetheless I think he should continue the Bactrim I did send in gentamicin topical as well in order to see if we can help keep this under control as far as the infection is concerned Electronic Signature(s) Signed: 12/03/2022 2:33:23 PM By: Worthy Keeler PA-C Entered By: Worthy Keeler on 12/03/2022 14:33:23 -------------------------------------------------------------------------------- Physician Orders Details Patient Name: Date of Service: [name], [name] [name] 12/03/2022 1:00 PM Medical Record Number: [id number] Patient Account Number: [id number] Date of Birth/Sex: Treating RN: [birth year] (77 y.o. 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 Bathing/ Shower/ Hygiene May shower; gently cleanse wound with antibacterial soap, rinse and pat dry prior to dressing wounds Non-Wound Condition dditional non-wound orders/instructions: - Apply 40% Urea cream to dry cracked skin on feet. A Off-Loading Open toe surgical shoe with peg assist. Medications-Please add to medication list. ntibiotics - continue Bactrim P.O. A Wound Treatment Wound #10 - Calcaneus Wound Laterality: Left Topical: Gentamicin 3 x Per Week/30 Days Discharge Instructions: Apply as directed by provider. Prim Dressing: Hydrofera Blue Ready Transfer Foam, 2.5x2.5 (in/in) (DME) (Dispense As Written) 3 x Per Week/30 Days ary Discharge Instructions: Apply Hydrofera Blue Ready to wound bed as directed Secondary Dressing: ABD Pad 5x9 (in/in) (DME) (Generic) 3 x Per Week/30 Days Discharge Instructions: Cover with ABD pad Secured With: Kerlix Roll Sterile or Non-Sterile 6-ply 4.5x4 (yd/yd) 3 x Per Week/30 Days Discharge Instructions: Apply Kerlix as directed Secured With: Tubigrip Size D, 3x10 (in/yd) 3 x Per Week/30 Days Electronic Signature(s) Signed: 12/03/2022 5:39:55 PM By: Worthy Keeler PA-C Signed: 12/07/2022 4:23:47 PM By: Massie Kluver Entered By: Massie Kluver on 12/03/2022 14:00:38 -------------------------------------------------------------------------------- Problem List Details Patient Name: Date  of Service: [name], [name] [name] 12/03/2022 1:00 PM Medical Record Number: [id number] Patient Account Number:  [id number] Date of Birth/Sex: Treating RN: [birth year] (77 y.o. [name] Primary Care Provider: Fulton Reek Other Clinician: Massie Kluver Referring Provider: Treating Provider/Extender: Maurilio Lovely Weeks in Treatment: 23 Active Problems ICD-10 Encounter Code Description Active Date MDM Diagnosis E11.621 Type 2 diabetes mellitus with foot ulcer 06/19/2022 No Yes 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sed 06/19/2022 No Yes [name], [name] ([id number]) [id number] Page 7 of 11 E11.42 Type 2 diabetes mellitus with diabetic polyneuropathy 06/19/2022 No Yes Inactive Problems Resolved Problems Electronic Signature(s) Signed: 12/03/2022 1:07:30 PM By: Worthy Keeler PA-C Entered By: Worthy Keeler on 12/03/2022 13:07:30 -------------------------------------------------------------------------------- Progress Note Details Patient Name: Date of Service: [name], [name] [name] 12/03/2022 1:00 PM Medical Record Number: [id number] Patient Account Number: [id number] Date of Birth/Sex: Treating RN: July 05, 1946 (77 y.o. [name] Primary Care Provider: Fulton Reek Other Clinician: Massie Kluver Referring Provider: Treating Provider/Extender: Maurilio Lovely Weeks in Treatment: 23 Subjective Chief Complaint Information obtained from Patient Left heel ulcer History of Present Illness (HPI) 09/24/2020 on evaluation today patient presents today for a heel ulcer that he tells me has been present for about 2 years. He has been seeing podiatry and they have been attempting to manage this including what sounds to be a total contact cast, Unna boot, and just standard dressings otherwise as well. Most recently has been using triple antibiotic ointment. With that being said unfortunately despite everything he really has not had any significant improvement. He tells me that he cannot even really  remember exactly how this began but he presumed it may have rubbed on his shoes or something of that nature. 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 He tells me that this does seem to help. Fortunately there is no signs of active infection at this time. His most recent hemoglobin A1c was 8.1 though he knows this was this year he cannot tell me the exact time. His fluid pills currently to help with some of the lower extremity edema although he does obviously have signs of venous stasis/lymphedema. Currently there is no evidence of active infection. No fevers, chills, nausea, vomiting, or diarrhea. Patient has had fairly recent ABIs which were performed on 07/19/2020 and revealed that he has normal findings in both the ankle and toe locations bilaterally. His ABI on the right was 1.09 on the left was 1.08 with a TBI on the right of 0.88 and on the left of 0.94. Triphasic flow was noted throughout. 10/08/2020 on evaluation today patient appears to be doing pretty well in regard to his left heel currently in fact this is doing a great job and seems to be healing quite nicely. Unfortunately on his right leg he had a pile of wood that actually fell on him injuring his right leg this is somewhat erythematous has me concerned little bit about cellulitis though there is not really a good area to culture at this point. 10/24/2020 upon evaluation today patient appears to be doing well with regard to his heel ulcer. He is showing signs of improvement which  is great news. His right leg is completely healed. Overall I feel like he is doing excellent and there is no signs of infection. 11/07/2020 upon evaluation today patient appears to be  doing well with regard to his heel ulcer. He tells me that last week when he was unable to come in his wife actually thought that the wound was very close to closing if not closed. Then it began to "reopen again". I really feel like what may have happened as the collagen may have dried over the wound bed and that because that misunderstanding with thinking that the wound was healing. With that being said I did not see it last week I do not know that for certain. Either way I feel like he is doing great today I see no signs of infection at this point. 11/26/2020 upon inspection today patient appears to be doing decently well in regard to his heel ulcer. He has been tolerating the dressing changes without complication. Fortunately there is no sign of active infection at this time. No fevers, chills, nausea, vomiting, or diarrhea. 12/10/2020 upon evaluation today patient appears to be doing fairly well in regard to the wound on his heel as well as what appears to be a new wound of the left first metatarsal head plantar aspect. This seems to be an area that was callus that has split as the patient tells me has been trying to walk on his toes more has probably where this came from. With that being said there does not appear to be signs of active infection which is great news. 12/17/2020 upon evaluation today patient actually appears to be making good progress currently. Fortunately there is no evidence of active infection at this time. Overall I feel like he is very close to complete closure. 12/26/2020 upon evaluation today patient appears to be doing excellent in regard to his wounds. In fact I am not certain that these are not even completely [name], [name] ([id number]) [id number] Page 8 of 11 healed on initial inspection. Overall I am very pleased with where things stand today. Good news is that they are healed he is actually get ready to go out of town and that will be helpful  as well as he will be a full part of the time. Readmission: 02/04/2021 upon evaluation today patient appears to be doing well at this point in regard to his left heel that I previously saw him for. Unfortunately he is having issues with his right lower extremity. He has significant wounds at this point he also has erythema noted there is definite signs of cellulitis which is unfortunate. With that being said I think we do need to address this sooner rather than later. The good news is he did have a nice trip to the beach. He tells me that he had no issues during that time. 4/27; patient on Bactrim. Culture showed methicillin sensitive staph aureus therefore the Bactrim should be effective. 2 small areas on the right leg are healed the area on the mid aspect of the tibia almost 100% covered in a very adherent necrotic debris. We have been using silver alginate 02/20/2021 upon evaluation today patient appears to be doing well with regard to his wound. He is showing signs of improvement which is great news overall very pleased with where things stand today. No fevers, chills, nausea, vomiting, or diarrhea. 02/27/2021 upon evaluation today patient appears to be doing well with regard to his leg ulcer. He is tolerating the dressing  changes and overall appears to be doing quite excellent. I am extremely pleased with where things stand and overall I think patient is making great progress. There is no sign of active infection at this time which is also great news. 03/06/2021 upon evaluation today patient appears to be doing excellent in regard to his wounds. In fact he appears to be completely healed today based on what I am seeing. This is excellent news and overall I am extremely pleased with where he stands. Overall the patient is happy to hear this as well this has been quite sometime coming. Readmission: 04/01/2021 patient unfortunately returns for readmission today. He tells me that he has been wearing his  compression socks on the right leg daily and he does not really know what is going on and why his legs are doing what they are doing. We did therefore go ahead and probe deeper into exactly what has been going on with him today. Subsequently the patient tells me that when he gets up and what we would call "first thing in the morning" are really 2 different things". He does not tend to sleep well so he tells me that he will often wake up at 3:00 in the morning. He will then potentially going into the living room to get in his chair where he may read a book for a little while and then potentially fall asleep back in his chair. He then subsequently wake up around 5:00 or so and then get up and in his words "putter around". This often will end with him proceeding at some point around 8 AM or 9 AM to putting on his compression socks. With that being said this means that anywhere from the 3:00 in the morning till roughly around 9:00 in the morning he has no compression on yet he is sitting in his recliner, walking around and up and about, and this is at least 5 to 6 hours of noncompressed time. That may be our issue here but I did not realize until we discussed this further today. Fortunately there does not appear to be any signs of active infection at this time which is great news. With that being said he has multiple wounds of the bilateral lower extremities. 04/10/2021 upon evaluation today patient appears to be doing well with regard to his legs for the most part. He does look like he had some injury where his wrap slid down nonetheless he did some "doctoring on them". I do feel like most of the areas honestly have cleared back up which is great news. There does not appear to be any signs of active infection at this time which is also great news. No fevers, chills, nausea, vomiting, or diarrhea. 04/18/2021 upon evaluation today patient appears to be doing well with regard to his wounds in fact he appears to  be completely healed which is great news. Fortunately there does not appear to be any signs of active infection which is great news. No fevers, chills, nausea, vomiting, or diarrhea. READMISSION 07/28/2021 This is a now 77 year old man we have had in this clinic for quite a bit of this year. 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 He wears compression stockings. 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 He comes in an old running shoes although he says he wears better shoes at home. His history is that he noticed a blister in the right  posterior heel. This open. He has been applying Neosporin to it currently the wound measures 2 x 1.4 cm. 100% slough covered. Not really offloading this in any rigorous way. Past medical history is essentially unchanged he has paroxysmal atrial fibrillation on Coumadin type 2 diabetes with a recent hemoglobin A1c of 7 on metformin. ABI in our clinic was 0.98 on the right 08/05/2021 upon evaluation today patient appears to be doing well with regard to his heel ulcer. Fortunately there does not appear to be any signs of active infection at this time. Overall I been very pleased with where things seem to be currently as far as the wound healing is concerned. Again this is first a lot of seeing him Dr. Dellia Nims readmitted him last week. Nonetheless I think we are definitely making some progress here. 08/11/2021 upon evaluation today patient's wound on the heel actually showing signs of excellent improvement. I am actually very pleased with where things stand currently. No fevers, chills, nausea, vomiting, or diarrhea. There does not appear to be any need for sharp debridement today either which is also great news. 08/25/2021 upon evaluation today patient appears to be doing well with regard to his heel ulcer. This is actually looking significantly improved  compared to last time I saw him. Fortunately there does not appear to be any evidence of active infection at this time. No fevers, chills, nausea, vomiting, or diarrhea. 09/01/2021 upon evaluation today patient appears to be doing decently well in regard to his wounds. Fortunately there does not appear to be any signs of active infection at this time which is great news and overall very pleased in that regard. I do not see any evidence of active infection systemically which is great news as well. No fevers, chills, nausea, vomiting, or diarrhea. I think that the heel is making excellent progress. 09/15/2021 upon evaluation today patient's heel unfortunately is significantly worse compared to what it was previous. I do believe that at this time he would benefit from switching back to something a little bit more offloading from the cushion shoe he has right now this is more of a heel protector what I really need is a Prevalon offloading boot which I think is good to do a lot better for him than just the small heel protector. 09/23/2021 upon evaluation today patient appears to be doing a little better in regards to last week's visit. Overall I think that he is making good progress which is great news and there does not appear to be any evidence of active infection at this time. No fevers, chills, nausea, vomiting, or diarrhea. 09/30/2021 upon evaluation patient's heel is actually showing signs of good improvement which is great news. Fortunately there does not appear to be any evidence of active infection locally nor systemically at this point. No fevers, chills, nausea, vomiting, or diarrhea. 10/07/2021 upon evaluation today patient appears to be doing well currently in regard to his heel ulcer. He has been tolerating the dressing changes without complication. Fortunately I do not see any signs of active infection at this time which is great news. No fevers, chills, nausea, vomiting, or diarrhea. 12/27;  wound is measuring smaller. We have been using silver alginate 10/28/2021 upon evaluation today patient appears to be doing well currently in regard to his heel ulcer. I am actually very pleased with where things stand and I think he is making excellent progress. Fortunately I do not see any signs of active infection locally nor systemically at this point  which is great news. Nonetheless I do believe that the patient would benefit from a new offloading shoe and has been using it Velcro is no longer functioning properly and he tells me that he almost tripped and fell because of that today. Obviously I do not want him following that would be very bad. [name], [name] ([id number]) [id number] Page 9 of 11 11/11/2021 upon evaluation today patient appears to be doing excellent in regard to his wound. In fact this appears to be completely healed based on what I see currently. I do not see any signs of anything open or draining and I did double check just by clearing away some of the callus around the edges of the wound to ensure that there was nothing still open or hiding underneath. Once this was cleared away it appeared that the patient was doing significantly better at this time which is great news and there was nothing actually open. Readmission: 06-19-2022 upon evaluation today patient appears to be doing well currently in regard to his heel ulcer all things considered. He unfortunately is having a bit of breakdown in general as far as the heel is concerned not nearly as bad as what we noted last time he was here in the clinic. The good news is I do think that this should hopefully heal much more rapidly than what we noted last time. His past medical history has not changed he is on Coumadin. 07-03-2022 upon evaluation today patient appears to be doing better in regard to his heel. We will start to see some improvement here which is good news. Fortunately I do not see any  evidence of infection locally or systemically at this time which is great news. No fevers, chills, nausea, vomiting, or diarrhea. 07-10-2022 upon evaluation today patient appears to be doing well currently in regard to his wound from the callus standpoint around the edges. Unfortunately from an actual wound bed standpoint he has some deep tissue injury noted at this point. Again I am not sure exactly what is going on that is causing this pressure to the region but he is definitely gotten pressure that is occurring and causing this to not heal as effectively as what we would like to see. I discussed with him today that he may need to at night when he sleeping use a pillow up underneath his calf in order to prevent the heel from touching the bed at all. I also think that it may be beneficial for him to monitor throughout his day and make sure there is no other time when he is getting the pressure to the heel. 07-23-2022 upon evaluation today patient appears to be doing poorly currently in regard to his heel ulcer. He has been tolerating the dressing changes without complication. Unfortunately I feel like he may be infected based on what I am seeing. 07-28-2022 I did review patient's culture results which showed positive for Proteus as well as Staphylococcus both of which would be treated with the Bactrim DS that I gave him at the last visit. This is good news and hopefully means that we should be able to apply the cast today as long as the wound itself does not appear to be doing poorly. Patient's wound bed actually showed signs of doing quite well and I am very pleased with where things stand and I do not see any signs of worsening overall which is great news. 08-04-2022 upon evaluation today patient appears to be doing well currently in regard to  his heel ulcer. I am actually very pleased with where things stand and I do think this is looking significantly better. With regard to his right shin that is  also showing signs of improvement which is great news. 08-10-2022 upon evaluation today patient's wound on the heel actually appears to be doing significantly better. In regard to the right anterior shin this is showing signs of healing it might even be completely healed but I still get a monitor I cannot get anything to fill away but also could not see where there was anything actually draining at this point. I am tending to think healed but I want a monitor 1 more week before I call it for sure. 08-17-2022 upon evaluation today patient appears to be doing well currently in regard to his heel. Fortunately he is tolerating the dressing changes without complication. I do not see any signs of infection and overall I think he is making good progress here. We did get approval for the Apligraf but I think he had a $295 co-pay that would have to be paid per application. With that being said as good as he is doing right now I do not think that is necessary but is still an option depending on how things progress if we need to speed this up quite a bit. 08/24/2022; this patient has a wound on his left heel in the setting of type 2 diabetes. We have been using Prisma. He has a heel offloading boot 08-31-2022 upon evaluation today patient appears to be doing poorly in regard to his heel ulcer which is still showing signs of bruising I am just not as happy as I was when I saw him 2 weeks ago with this. He saw Dr. Dellia Nims last week. Also did not look good at that point apparently. Nonetheless I think that the patient is still continuing to have some counterpressure getting to the wound bed unfortunately. 09-07-2022 upon evaluation today patient appears to be doing well currently in regard to his heel ulcer. He has been tolerating the dressing changes without complication. Fortunately there does not appear to be any signs of active infection locally or systemically at this time. Fortunately I do not see any  evidence of active infection at this time. 09-15-2022 upon evaluation today patient appears to be doing well currently in regard to his legs which in fact appear to be completely healed this is great news. With that being said his heel does have some erythema and warmth as well as drainage I am concerned about cellulitis at this location. 09-21-2022 upon evaluation today patient's wound is actually showing signs of being a little bit smaller but still we are not doing nearly as well as what I would like to see. Fortunately I do not see any signs of infection at this time which is great news and overall I am extremely pleased in that regard. With that being said I do think that he needs to continue to elevate his leg although the edema is under great control with a compression wrap I think switching to a different dressing topically may be beneficial. My suggestion is to switch him over to a Iodoflex dressing. 09-28-2022 upon evaluation today patient appears to be doing well with regard to his heel ulcer. This is actually showing signs of improvement which is great news and overall I am extremely pleased with where we stand today. 10-06-2022 upon evaluation today patient actually appears to be making excellent progress at this point. Fortunately there  does not appear to be any signs of active infection locally nor systemically which is great news and overall I am extremely pleased with where we stand. I do feel like he is actually making some pretty good progress here as we switch to the wrap along with the Iodoflex. 12/26; patient has a wound on the tip of his left heel. This is not a weightbearing surface. He is using a heel offloading boot and carefully offloading this at other times during the day. He is using Iodoflex and Zetuvitunder 3 layer compression 10-22-2022 upon evaluation today patient appears to be doing well currently in regard to his wound. He has been tolerating the dressing changes  without complication. Fortunately there does not appear to be any signs of active infection locally nor systemically at this time. No fevers, chills, nausea, vomiting, or diarrhea. 08-30-2023 upon evaluation today patient appears to be doing well currently in regard to his heel ulcer which is showing signs of good improvement. Fortunately I do not see any evidence of infection locally nor systemically which is great news and overall I am extremely pleased with where we stand. No fevers, chills, nausea, vomiting, or diarrhea. Unfortunately he does have a wound on the side of his fifth toe where I think this did rub on the shoe. 11-05-2022 upon evaluation today patient still still showing some signs of pressure at this point and there may have even been some fluid collection at this time. Fortunately I do not see any evidence of active infection locally nor systemically which is great news and overall I am extremely pleased with where we stand. No fevers, chills, nausea, vomiting, or diarrhea. 1/25; the patient's area on the dorsal aspect of the left fifth toe is healed. We have been using Iodoflex to the area on the left heel. The lateral wound is slightly down in length. 11-19-2022 upon evaluation today patient appears to be doing well currently in regard to his wound. He has been tolerating the dressing changes without complication. Fortunately there does not appear to be any signs of infection we been using Iodoflex up to this point. 11-26-2022 upon evaluation today patient appears to be doing poorly currently in regard to his wound. He has been tolerating the dressing changes with the Iodoflex without complication. With that being said he does not have any signs of active infection systemically though locally I do feel like there is some evidence of infection here. 12-03-2022 upon evaluation today patient appears to be doing well currently in regard to his wound this is better with the Bactrim compared  to last week still there [name], [name] ([id number]) [id number] Page 10 of 11 was some significant slough and biofilm buildup which is going require sharp debridement at this point. Fortunately I do not see any signs of active infection locally nor systemically which is great news. Objective Constitutional Well-nourished and well-hydrated in no acute distress. Vitals Time Taken: 1:09 PM, Height: 70 in, Weight: 265 lbs, BMI: 38, Temperature: 98.1 F, Pulse: 80 bpm, Respiratory Rate: 18 breaths/min, Blood Pressure: 172/74 mmHg. Respiratory normal breathing without difficulty. Psychiatric this patient is able to make decisions and demonstrates good insight into disease process. Alert and Oriented x 3. pleasant and cooperative. General Notes: Upon inspection patient's wound bed actually showed signs of good granulation epithelization at this point. Fortunately I do not see anything that seems to be worsening significantly which is great news and in general I do think that we are on the right track.  Nonetheless I think he should continue the Bactrim I did send in gentamicin topical as well in order to see if we can help keep this under control as far as the infection is concerned Integumentary (Hair, Skin) Wound #10 status is Open. Original cause of wound was Gradually Appeared. The date acquired was: 06/05/2022. The wound has been in treatment 23 weeks. The wound is located on the Left Calcaneus. The wound measures 1.7cm length x 3cm width x 0.2cm depth; 4.006cm^2 area and 0.801cm^3 volume. There is Fat Layer (Subcutaneous Tissue) exposed. There is a medium amount of serosanguineous drainage noted. The wound margin is flat and intact. There is small (1-33%) pink granulation within the wound bed. There is a large (67-100%) amount of necrotic tissue within the wound bed including Adherent Slough. 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 There was a Excisional Skin/Subcutaneous Tissue Debridement with a total area of 5.1 sq cm performed by Tommie Sams., PA-C. With the following instrument(s): Curette to remove Viable and Non-Viable tissue/material. Material removed includes Subcutaneous Tissue, Slough, and Biofilm. A time out was conducted at 13:52, prior to the start of the procedure. A Minimum amount of bleeding was controlled with Pressure. The procedure was tolerated well. Post Debridement Measurements: 1.7cm length x 3cm width x 0.2cm depth; 0.801cm^3 volume. Character of Wound/Ulcer Post Debridement is stable. Severity of Tissue Post Debridement is: Fat layer exposed. Post procedure Diagnosis Wound #10: Same as Pre-Procedure Plan Follow-up Appointments: Wound #10 Left Calcaneus: Return Appointment in 1 week. Bathing/ Shower/ Hygiene: May shower; gently cleanse wound with antibacterial soap, rinse and pat dry prior to dressing wounds Non-Wound Condition: Additional non-wound orders/instructions: - Apply 40% Urea cream to dry cracked skin on feet. Off-Loading: Open toe surgical shoe with peg assist. Medications-Please add to medication list.: P.O. Antibiotics - continue Bactrim WOUND #10: - Calcaneus Wound Laterality: Left Topical: Gentamicin 3 x Per Week/30 Days Discharge Instructions: Apply as directed by provider. Prim Dressing: Hydrofera Blue Ready Transfer Foam, 2.5x2.5 (in/in) (DME) (Dispense As Written) 3 x Per Week/30 Days ary Discharge Instructions: Apply Hydrofera Blue Ready to wound bed as directed AMERY, MINASYAN ([id number]) [id number] Page 11  of 11 Secondary Dressing: ABD Pad 5x9 (in/in) (DME) (Generic) 3 x Per Week/30 Days Discharge Instructions: Cover with ABD pad Secured With: Kerlix Roll Sterile or Non-Sterile 6-ply 4.5x4 (yd/yd) 3 x Per Week/30 Days Discharge Instructions: Apply Kerlix as directed Secured With: Tubigrip Size D, 3x10 (in/yd) 3 x Per Week/30 Days 1. I am going to recommend that we have the patient continue to monitor for any signs of infection or worsening. Based on what I am seeing I do believe we are headed in the right direction my hope is that he will continue to show signs of improvement ongoing. 2. I am also going to recommend specifically that we switch to [facility] were using topical gentamicin along with this. 3. He will complete the Bactrim DS as well. 4. I will take him out of the compression wrap for the time being because of longer to be able to apply the gentamicin more frequently. He is in agreement with this plan and we will subsequently see where  things stand at follow-up. As soon as we get the infection under control I would like to get him back into the compression wrapping. We will see patient back for reevaluation in 1 week here in the clinic. If anything worsens or changes patient will contact our office for additional recommendations. Electronic Signature(s) Signed: 12/03/2022 2:35:06 PM By: Worthy Keeler PA-C Previous Signature: 12/03/2022 2:34:18 PM Version By: Worthy Keeler PA-C Entered By: Worthy Keeler on 12/03/2022 14:35:06 -------------------------------------------------------------------------------- SuperBill Details Patient Name: Date of Service: [name], [name] [name] 12/03/2022 Medical Record Number: [id number] Patient Account Number: [id number] Date of Birth/Sex: Treating RN: [birth year] (77 y.o. [name] Primary Care Provider: Fulton Reek Other Clinician: Massie Kluver Referring Provider: Treating Provider/Extender: Maurilio Lovely Weeks in  Treatment: 23 Diagnosis Coding ICD-10 Codes Code Description E11.621 Type 2 diabetes mellitus with foot ulcer 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sed E11.42 Type 2 diabetes mellitus with diabetic polyneuropathy Facility Procedures : CPT4 Code: [id number] Description: 76160 - DEB SUBQ TISSUE 20 SQ CM/< ICD-10 Diagnosis Description 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sed Modifier: Quantity: 1 Physician Procedures : CPT4 Code Description Modifier [id number] - WC PHYS SUBQ TISS 20 SQ CM ICD-10 Diagnosis Description 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sed Quantity: 1 Electronic Signature(s) Signed: 12/03/2022 2:36:05 PM By: Worthy Keeler PA-C Entered By: Worthy Keeler on 12/03/2022 14:36:04

## 2022-12-10 ENCOUNTER — Encounter: Payer: Medicare HMO | Admitting: Internal Medicine

## 2022-12-10 ENCOUNTER — Ambulatory Visit: Payer: Medicare HMO | Admitting: Physician Assistant

## 2022-12-11 ENCOUNTER — Encounter: Payer: Medicare HMO | Admitting: Physician Assistant

## 2022-12-11 DIAGNOSIS — E11621 Type 2 diabetes mellitus with foot ulcer: Secondary | ICD-10-CM | POA: Diagnosis not present

## 2022-12-14 NOTE — Progress Notes (Signed)
[name] ([id number]) [id number] Page 1 of 12 Visit Report for 12/11/2022 Chief Complaint Document Details Patient Name: Date of Service: [name], [name] [name]. 12/11/2022 7:30 A M Medical Record Number: [id number] Patient Account Number: [id number] Date of Birth/Sex: Treating RN: Oct 14, 1946 (77 y.o. [name] Primary Care Provider: Fulton Reek Other Clinician: Massie Kluver Referring Provider: Treating Provider/Extender: Maurilio Lovely Weeks in Treatment: 25 Information Obtained from: Patient Chief Complaint Left heel ulcer Electronic Signature(s) Signed: 12/11/2022 1:45:46 PM By: Worthy Keeler PA-C Entered By: Worthy Keeler on 12/11/2022 08:37:04 -------------------------------------------------------------------------------- Debridement Details Patient Name: Date of Service: [name], [name]. 12/11/2022 7:30 A M Medical Record Number: [id number] Patient Account Number: [id number] Date of Birth/Sex: Treating RN: [birth year] (77 y.o. [name] Primary Care Provider: Fulton Reek Other Clinician: Massie Kluver Referring Provider: Treating Provider/Extender: Maurilio Lovely Weeks in Treatment: 25 Debridement Performed for Assessment: Wound #10 Left Calcaneus Performed By: Physician Tommie Sams., PA-C Debridement Type: Debridement Severity of Tissue Pre Debridement: Fat layer exposed Level of Consciousness (Pre-procedure): Awake and Alert Pre-procedure Verification/Time Out Yes - 08:46 Taken: Start Time: 08:46 T Area Debrided (L x W): otal 1.4 (cm) x 2.5 (cm) = 3.5 (cm) Tissue and other material debrided: Viable, Non-Viable, Slough, Subcutaneous, Slough Level: Skin/Subcutaneous Tissue Debridement Description: Excisional Instrument: Curette Bleeding: Minimum Hemostasis Achieved: Pressure Response to Treatment: Procedure was tolerated well Level of Consciousness (Post- Awake and  Alert procedure): LACEDRIC, GAUGH ([id number]) [id number] Page 2 of 12 Post Debridement Measurements of Total Wound Length: (cm) 1.4 Width: (cm) 2.5 Depth: (cm) 0.2 Volume: (cm) 0.55 Character of Wound/Ulcer Post Debridement: Stable Severity of Tissue Post Debridement: Fat layer exposed Post Procedure Diagnosis Same as Pre-procedure Electronic Signature(s) Signed: 12/11/2022 12:25:37 PM By: [name], BSN, RN, CWS, [name] RN, BSN Signed: 12/11/2022 1:45:46 PM By: Worthy Keeler PA-C Signed: 12/14/2022 9:50:02 AM By: Massie Kluver Entered By: Massie Kluver on 12/11/2022 08:48:36 -------------------------------------------------------------------------------- HPI Details Patient Name: Date of Service: [name], [name]. 12/11/2022 7:30 A M Medical Record Number: [id number] Patient Account Number: [id number] Date of Birth/Sex: Treating RN: 1945-10-21 (77 y.o. [name] Primary Care Provider: Fulton Reek Other Clinician: Massie Kluver Referring Provider: Treating Provider/Extender: Maurilio Lovely Weeks in Treatment: 25 History of Present Illness HPI Description: 09/24/2020 on evaluation today patient presents today for a heel ulcer that he tells me has been present for about 2 years. He has been seeing podiatry and they have been attempting to manage this including what sounds to be a total contact cast, Unna boot, and just standard dressings otherwise as well. Most recently has been using triple antibiotic ointment. With that being said unfortunately despite everything he really has not had any significant improvement. He tells me that he cannot even really remember exactly how this began but he presumed it may have rubbed on his shoes or something of that nature. 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 He tells me that this does seem to help. Fortunately there is no signs of active infection at this time. His most recent hemoglobin A1c was 8.1 though he knows this was this year he cannot tell me the  exact time. His fluid pills currently to help with some of the lower extremity edema although he does obviously have signs of venous stasis/lymphedema. Currently there is no evidence of active infection. No fevers, chills, nausea, vomiting, or diarrhea. Patient has had fairly recent ABIs which were performed on 07/19/2020 and revealed that he has normal findings in both the ankle and toe locations bilaterally. His ABI on the right was 1.09 on the left was 1.08 with a TBI on the right of 0.88 and on the left of 0.94. Triphasic flow was noted throughout. 10/08/2020 on evaluation today patient appears to be doing pretty well in regard to his left heel currently in fact this is doing a great job and seems to be healing quite nicely. Unfortunately on his right leg he had a pile of wood that actually fell on him injuring his right leg this is somewhat erythematous has me concerned little bit about cellulitis though there is not really a good area to culture at this point. 10/24/2020 upon evaluation today patient appears to be doing well with regard to his heel ulcer. He is showing signs of improvement which is great news. His right leg is completely healed. Overall I feel like he is doing excellent and there is no signs of infection. 11/07/2020 upon evaluation today patient appears to be doing well with regard to his heel ulcer. He tells me that last week when he was unable to come in his wife actually thought that the wound was very close to closing if not closed. Then it began to "reopen again". I really feel like what may have happened as the collagen may have dried  over the wound bed and that because that misunderstanding with thinking that the wound was healing. With that being said I did not see it last week I do not know that for certain. Either way I feel like he is doing great today I see no signs of infection at this point. 11/26/2020 upon inspection today patient appears to be doing decently well in regard to his heel ulcer. He has been tolerating the dressing changes without complication. Fortunately there is no sign of active infection at this time. No fevers, chills, nausea, vomiting, or diarrhea. 12/10/2020 upon evaluation today patient appears to be doing fairly well in regard to the wound on his heel as well as what appears to be a new wound of the left first metatarsal head plantar aspect. This seems to be an area that was callus that has split as the patient tells me has been trying to walk on his toes more has probably where this came from. With that being said there does not appear to be signs of active infection which is great news. 12/17/2020 upon evaluation today patient actually appears to be making good progress currently. Fortunately there is no evidence of active infection at this time. Overall I feel like he is very close to complete closure. 12/26/2020 upon evaluation today patient appears to be doing excellent in regard to his wounds. In fact I am not certain that these are not even completely healed on initial inspection. Overall I am very pleased with where things stand today. Good news is that they are healed he is actually get ready to go out of town and that will be helpful as well as he will be a full part of the time. FRANCO, EDMUNDSON ([id number]) [id number] Page 3 of 12 Readmission: 02/04/2021 upon evaluation today patient appears to be doing well at  this point in regard to his left heel that I previously saw him for. Unfortunately he is having issues with his right lower extremity. He has significant  wounds at this point he also has erythema noted there is definite signs of cellulitis which is unfortunate. With that being said I think we do need to address this sooner rather than later. The good news is he did have a nice trip to the beach. He tells me that he had no issues during that time. 4/27; patient on Bactrim. Culture showed methicillin sensitive staph aureus therefore the Bactrim should be effective. 2 small areas on the right leg are healed the area on the mid aspect of the tibia almost 100% covered in a very adherent necrotic debris. We have been using silver alginate 02/20/2021 upon evaluation today patient appears to be doing well with regard to his wound. He is showing signs of improvement which is great news overall very pleased with where things stand today. No fevers, chills, nausea, vomiting, or diarrhea. 02/27/2021 upon evaluation today patient appears to be doing well with regard to his leg ulcer. He is tolerating the dressing changes and overall appears to be doing quite excellent. I am extremely pleased with where things stand and overall I think patient is making great progress. There is no sign of active infection at this time which is also great news. 03/06/2021 upon evaluation today patient appears to be doing excellent in regard to his wounds. In fact he appears to be completely healed today based on what I am seeing. This is excellent news and overall I am extremely pleased with where he stands. Overall the patient is happy to hear this as well this has been quite sometime coming. Readmission: 04/01/2021 patient unfortunately returns for readmission today. He tells me that he has been wearing his compression socks on the right leg daily and he does not really know what is going on and why his legs are doing what they are doing. We did therefore go ahead and probe deeper into exactly what has been going on with him today. Subsequently the patient tells me that when he gets up  and what we would call "first thing in the morning" are really 2 different things". He does not tend to sleep well so he tells me that he will often wake up at 3:00 in the morning. He will then potentially going into the living room to get in his chair where he may read a book for a little while and then potentially fall asleep back in his chair. He then subsequently wake up around 5:00 or so and then get up and in his words "putter around". This often will end with him proceeding at some point around 8 AM or 9 AM to putting on his compression socks. With that being said this means that anywhere from the 3:00 in the morning till roughly around 9:00 in the morning he has no compression on yet he is sitting in his recliner, walking around and up and about, and this is at least 5 to 6 hours of noncompressed time. That may be our issue here but I did not realize until we discussed this further today. Fortunately there does not appear to be any signs of active infection at this time which is great news. With that being said he has multiple wounds of the bilateral lower extremities. 04/10/2021 upon evaluation today patient appears to be doing well with regard to his legs for the most part.  He does look like he had some injury where his wrap slid down nonetheless he did some "doctoring on them". I do feel like most of the areas honestly have cleared back up which is great news. There does not appear to be any signs of active infection at this time which is also great news. No fevers, chills, nausea, vomiting, or diarrhea. 04/18/2021 upon evaluation today patient appears to be doing well with regard to his wounds in fact he appears to be completely healed which is great news. Fortunately there does not appear to be any signs of active infection which is great news. No fevers, chills, nausea, vomiting, or diarrhea. READMISSION 07/28/2021 This is a now 77 year old man we have had in this clinic for quite a bit of  this year. 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 He wears compression stockings. 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 He comes in an old running shoes although he says he wears better shoes at home. His history is that he noticed a blister in the right posterior heel. This open. He has been applying Neosporin to it currently the wound measures 2 x 1.4 cm. 100% slough covered. Not really offloading this in any rigorous way. Past medical history is essentially unchanged he has paroxysmal atrial fibrillation on Coumadin type 2 diabetes with a recent hemoglobin A1c of 7 on metformin. ABI in our clinic was 0.98 on the right 08/05/2021 upon evaluation today patient appears to be doing well with regard to his heel ulcer. Fortunately there does not appear to be any signs of active infection at this time. Overall I been very pleased with where things seem to be currently as far as the wound healing is concerned. Again this is first a lot of seeing him Dr. Dellia Nims readmitted him last week. Nonetheless I think we are definitely making some progress here. 08/11/2021 upon evaluation today patient's wound on the heel actually showing signs of excellent improvement. I am actually very pleased with where things stand currently. No fevers, chills, nausea, vomiting, or diarrhea. There does not appear to be any need for sharp debridement today either which is also great news. 08/25/2021 upon evaluation today patient appears to be doing well with regard to his heel ulcer. This is actually looking significantly improved compared to last time I saw him. Fortunately there does not appear to be any evidence of active infection at this time. No fevers, chills, nausea, vomiting, or diarrhea. 09/01/2021 upon evaluation today patient appears to be doing decently well in regard to his wounds. Fortunately there  does not appear to be any signs of active infection at this time which is great news and overall very pleased in that regard. I do not see any evidence of active infection systemically which is great news as well. No fevers, chills, nausea, vomiting, or diarrhea. I think that the heel is making excellent progress. 09/15/2021 upon evaluation today patient's heel unfortunately is significantly worse compared to what it was previous. I do believe that at this time he would benefit from switching back to something a little bit more offloading from the cushion shoe he has right now this is more of a heel protector what I really need is a Prevalon offloading boot which I think is good to do a lot better for him than just the small heel protector. 09/23/2021 upon evaluation today patient appears to be doing a  little better in regards to last week's visit. Overall I think that he is making good progress which is great news and there does not appear to be any evidence of active infection at this time. No fevers, chills, nausea, vomiting, or diarrhea. 09/30/2021 upon evaluation patient's heel is actually showing signs of good improvement which is great news. Fortunately there does not appear to be any evidence of active infection locally nor systemically at this point. No fevers, chills, nausea, vomiting, or diarrhea. 10/07/2021 upon evaluation today patient appears to be doing well currently in regard to his heel ulcer. He has been tolerating the dressing changes without complication. Fortunately I do not see any signs of active infection at this time which is great news. No fevers, chills, nausea, vomiting, or diarrhea. 12/27; wound is measuring smaller. We have been using silver alginate 10/28/2021 upon evaluation today patient appears to be doing well currently in regard to his heel ulcer. I am actually very pleased with where things stand and I think he is making excellent progress. Fortunately I do not see  any signs of active infection locally nor systemically at this point which is great news. Nonetheless I do believe that the patient would benefit from a new offloading shoe and has been using it Velcro is no longer functioning properly and he tells me that he almost tripped and fell because of that today. Obviously I do not want him following that would be very bad. 11/11/2021 upon evaluation today patient appears to be doing excellent in regard to his wound. In fact this appears to be completely healed based on what I see AMANDA, DURANGO ([id number]) [id number] Page 4 of 12 currently. I do not see any signs of anything open or draining and I did double check just by clearing away some of the callus around the edges of the wound to ensure that there was nothing still open or hiding underneath. Once this was cleared away it appeared that the patient was doing significantly better at this time which is great news and there was nothing actually open. Readmission: 06-19-2022 upon evaluation today patient appears to be doing well currently in regard to his heel ulcer all things considered. He unfortunately is having a bit of breakdown in general as far as the heel is concerned not nearly as bad as what we noted last time he was here in the clinic. The good news is I do think that this should hopefully heal much more rapidly than what we noted last time. His past medical history has not changed he is on Coumadin. 07-03-2022 upon evaluation today patient appears to be doing better in regard to his heel. We will start to see some improvement here which is good news. Fortunately I do not see any evidence of infection locally or systemically at this time which is great news. No fevers, chills, nausea, vomiting, or diarrhea. 07-10-2022 upon evaluation today patient appears to be doing well currently in regard to his wound from the callus standpoint around the edges. Unfortunately from an  actual wound bed standpoint he has some deep tissue injury noted at this point. Again I am not sure exactly what is going on that is causing this pressure to the region but he is definitely gotten pressure that is occurring and causing this to not heal as effectively as what we would like to see. I discussed with him today that he may need to at night when he sleeping use a pillow up underneath his  calf in order to prevent the heel from touching the bed at all. I also think that it may be beneficial for him to monitor throughout his day and make sure there is no other time when he is getting the pressure to the heel. 07-23-2022 upon evaluation today patient appears to be doing poorly currently in regard to his heel ulcer. He has been tolerating the dressing changes without complication. Unfortunately I feel like he may be infected based on what I am seeing. 07-28-2022 I did review patient's culture results which showed positive for Proteus as well as Staphylococcus both of which would be treated with the Bactrim DS that I gave him at the last visit. This is good news and hopefully means that we should be able to apply the cast today as long as the wound itself does not appear to be doing poorly. Patient's wound bed actually showed signs of doing quite well and I am very pleased with where things stand and I do not see any signs of worsening overall which is great news. 08-04-2022 upon evaluation today patient appears to be doing well currently in regard to his heel ulcer. I am actually very pleased with where things stand and I do think this is looking significantly better. With regard to his right shin that is also showing signs of improvement which is great news. 08-10-2022 upon evaluation today patient's wound on the heel actually appears to be doing significantly better. In regard to the right anterior shin this is showing signs of healing it might even be completely healed but I still get a monitor  I cannot get anything to fill away but also could not see where there was anything actually draining at this point. I am tending to think healed but I want a monitor 1 more week before I call it for sure. 08-17-2022 upon evaluation today patient appears to be doing well currently in regard to his heel. Fortunately he is tolerating the dressing changes without complication. I do not see any signs of infection and overall I think he is making good progress here. We did get approval for the Apligraf but I think he had a $295 co-pay that would have to be paid per application. With that being said as good as he is doing right now I do not think that is necessary but is still an option depending on how things progress if we need to speed this up quite a bit. 08/24/2022; this patient has a wound on his left heel in the setting of type 2 diabetes. We have been using Prisma. He has a heel offloading boot 08-31-2022 upon evaluation today patient appears to be doing poorly in regard to his heel ulcer which is still showing signs of bruising I am just not as happy as I was when I saw him 2 weeks ago with this. He saw Dr. Dellia Nims last week. Also did not look good at that point apparently. Nonetheless I think that the patient is still continuing to have some counterpressure getting to the wound bed unfortunately. 09-07-2022 upon evaluation today patient appears to be doing well currently in regard to his heel ulcer. He has been tolerating the dressing changes without complication. Fortunately there does not appear to be any signs of active infection locally or systemically at this time. Fortunately I do not see any evidence of active infection at this time. 09-15-2022 upon evaluation today patient appears to be doing well currently in regard to his legs which in  fact appear to be completely healed this is great news. With that being said his heel does have some erythema and warmth as well as drainage I am concerned  about cellulitis at this location. 09-21-2022 upon evaluation today patient's wound is actually showing signs of being a little bit smaller but still we are not doing nearly as well as what I would like to see. Fortunately I do not see any signs of infection at this time which is great news and overall I am extremely pleased in that regard. With that being said I do think that he needs to continue to elevate his leg although the edema is under great control with a compression wrap I think switching to a different dressing topically may be beneficial. My suggestion is to switch him over to a Iodoflex dressing. 09-28-2022 upon evaluation today patient appears to be doing well with regard to his heel ulcer. This is actually showing signs of improvement which is great news and overall I am extremely pleased with where we stand today. 10-06-2022 upon evaluation today patient actually appears to be making excellent progress at this point. Fortunately there does not appear to be any signs of active infection locally nor systemically which is great news and overall I am extremely pleased with where we stand. I do feel like he is actually making some pretty good progress here as we switch to the wrap along with the Iodoflex. 12/26; patient has a wound on the tip of his left heel. This is not a weightbearing surface. He is using a heel offloading boot and carefully offloading this at other times during the day. He is using Iodoflex and Zetuvitunder 3 layer compression 10-22-2022 upon evaluation today patient appears to be doing well currently in regard to his wound. He has been tolerating the dressing changes without complication. Fortunately there does not appear to be any signs of active infection locally nor systemically at this time. No fevers, chills, nausea, vomiting, or diarrhea. 08-30-2023 upon evaluation today patient appears to be doing well currently in regard to his heel ulcer which is showing signs of  good improvement. Fortunately I do not see any evidence of infection locally nor systemically which is great news and overall I am extremely pleased with where we stand. No fevers, chills, nausea, vomiting, or diarrhea. Unfortunately he does have a wound on the side of his fifth toe where I think this did rub on the shoe. 11-05-2022 upon evaluation today patient still still showing some signs of pressure at this point and there may have even been some fluid collection at this time. Fortunately I do not see any evidence of active infection locally nor systemically which is great news and overall I am extremely pleased with where we stand. No fevers, chills, nausea, vomiting, or diarrhea. 1/25; the patient's area on the dorsal aspect of the left fifth toe is healed. We have been using Iodoflex to the area on the left heel. The lateral wound is slightly down in length. 11-19-2022 upon evaluation today patient appears to be doing well currently in regard to his wound. He has been tolerating the dressing changes without complication. Fortunately there does not appear to be any signs of infection we been using Iodoflex up to this point. 11-26-2022 upon evaluation today patient appears to be doing poorly currently in regard to his wound. He has been tolerating the dressing changes with the Iodoflex without complication. With that being said he does not have any signs of active  infection systemically though locally I do feel like there is some evidence of infection here. 12-03-2022 upon evaluation today patient appears to be doing well currently in regard to his wound this is better with the Bactrim compared to last week still there was some significant slough and biofilm buildup which is going require sharp debridement at this point. Fortunately I do not see any signs of active infection locally nor systemically which is great news. LAYKE, CATRETT ([id number]) [id number] Page 5 of  12 12-11-2022 upon evaluation today patient actually appears to be doing significantly better at this time in regard to his heel. I am actually very pleased with where things stand currently. There is no signs of active infection locally or systemically at this point. Electronic Signature(s) Signed: 12/11/2022 1:22:09 PM By: Worthy Keeler PA-C Entered By: Worthy Keeler on 12/11/2022 13:22:09 -------------------------------------------------------------------------------- Physical Exam Details Patient Name: Date of Service: [name], [name]. 12/11/2022 7:30 A M Medical Record Number: [id number] Patient Account Number: [id number] Date of Birth/Sex: Treating RN: 12/26/1945 (77 y.o. [name] Primary Care Provider: Fulton Reek Other Clinician: Massie Kluver Referring Provider: Treating Provider/Extender: Maurilio Lovely Weeks in Treatment: 17 Constitutional Well-nourished and well-hydrated in no acute distress. Respiratory normal breathing without difficulty. Psychiatric this patient is able to make decisions and demonstrates good insight into disease process. Alert and Oriented x 3. pleasant and cooperative. Notes Upon inspection patient's wound bed showed signs of good granulation and epithelization currently. I am actually very pleased with where we stand and I do believe that he is in the right pathway here as far as getting this healed. He is going to keep doing what he has been doing over the past week for the next week. Electronic Signature(s) Signed: 12/11/2022 1:22:26 PM By: Worthy Keeler PA-C Entered By: Worthy Keeler on 12/11/2022 13:22:26 -------------------------------------------------------------------------------- Physician Orders Details Patient Name: Date of Service: [name], [name]. 12/11/2022 7:30 A M Medical Record Number: [id number] Patient Account Number: [id number] Date of Birth/Sex: Treating RN: [birth year] (77 y.o. [name] Primary Care Provider: Fulton Reek Other Clinician: Massie Kluver Referring Provider: Treating Provider/Extender: [name] in Treatment: 25 Verbal / Phone Orders: No Diagnosis [address] 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 Bathing/ Shower/ Hygiene May shower; gently cleanse wound with antibacterial soap, rinse and pat dry prior to dressing wounds Non-Wound Condition dditional non-wound orders/instructions: - Apply 40% Urea cream to dry cracked skin on feet. A Off-Loading Open toe surgical shoe with peg assist. Medications-Please add to medication list. ntibiotics - continue Bactrim P.O. A Wound Treatment Wound #10 - Calcaneus Wound Laterality: Left Topical: Gentamicin 3 x Per Week/30 Days Discharge Instructions: Apply as directed by provider. Prim Dressing: Hydrofera Blue Ready Transfer Foam, 2.5x2.5 (in/in) (Dispense As Written) 3 x Per Week/30 Days ary Discharge Instructions: Apply Hydrofera Blue Ready to wound bed as directed Secondary Dressing: ABD Pad 5x9 (in/in) (Generic) 3 x Per Week/30 Days Discharge Instructions: Cover with ABD pad Secured With: Kerlix Roll Sterile or Non-Sterile 6-ply 4.5x4 (yd/yd) 3 x Per Week/30 Days Discharge Instructions: Apply Kerlix as directed Secured With: Tubigrip Size D, 3x10 (in/yd) 3 x Per Week/30 Days Electronic  Signature(s) Signed: 12/11/2022 1:45:46 PM By: Worthy Keeler PA-C Signed: 12/14/2022 9:50:02 AM By: Massie Kluver Entered By: Massie Kluver on 12/11/2022  09:35:53 -------------------------------------------------------------------------------- Problem List Details Patient Name: Date of Service: [name], [name]. 12/11/2022 7:30 A M Medical Record Number: [id number] Patient Account Number: [id number] Date of Birth/Sex: Treating RN: 11/28/1945 (77 y.o. [name] Primary Care Provider: Fulton Reek Other Clinician: Massie Kluver Referring Provider: Treating Provider/Extender: Maurilio Lovely Weeks in Treatment: 25 Active Problems ICD-10 Encounter Code Description Active Date MDM Diagnosis E11.621 Type 2 diabetes mellitus with foot ulcer 06/19/2022 No Yes RYCE, VARDEMAN ([id number]) [id number] Page 7 of 12 [phone] Non-pressure chronic ulcer of other part of left foot with fat layer exposed 06/19/2022 No Yes E11.42 Type 2 diabetes mellitus with diabetic polyneuropathy 06/19/2022 No Yes Inactive Problems Resolved Problems Electronic Signature(s) Signed: 12/11/2022 1:45:46 PM By: Worthy Keeler PA-C Entered By: Worthy Keeler on 12/11/2022 08:36:52 -------------------------------------------------------------------------------- Progress Note Details Patient Name: Date of Service: [name], [name]. 12/11/2022 7:30 A M Medical Record Number: [id number] Patient Account Number: [id number] Date of Birth/Sex: Treating RN: 1946/03/25 (77 y.o. [name] Primary Care Provider: Fulton Reek Other Clinician: Massie Kluver Referring Provider: Treating Provider/Extender: Maurilio Lovely Weeks in Treatment: 25 Subjective Chief Complaint Information obtained from Patient Left heel ulcer History of Present Illness (HPI) 09/24/2020 on evaluation today patient presents today for a heel ulcer that he tells me has been present for about 2 years. He has been seeing podiatry and they have been attempting to manage this including what sounds to be a total contact cast, Unna boot, and  just standard dressings otherwise as well. Most recently has been using triple antibiotic ointment. With that being said unfortunately despite everything he really has not had any significant improvement. He tells me that he cannot even really remember exactly how this began but he presumed it may have rubbed on his shoes or something of that nature. 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 He tells me that this does seem to help. Fortunately there is no signs of active infection at this time. His most recent hemoglobin A1c was 8.1 though he knows this was this year he cannot tell me the exact time. His fluid pills currently to help with some of the lower extremity edema although he does obviously have signs of venous stasis/lymphedema. Currently there is no evidence of active infection. No fevers, chills, nausea, vomiting, or diarrhea. Patient has had fairly recent ABIs which were performed on 07/19/2020 and revealed that he has normal findings in both the ankle and toe locations bilaterally. His ABI on the right was 1.09 on the left was 1.08 with a TBI on the right of 0.88 and on the left of 0.94. Triphasic flow was noted throughout. 10/08/2020 on evaluation today patient appears to be doing pretty well in regard to his left heel currently in fact this is doing a great job and seems to be healing quite nicely. Unfortunately on his right leg he had a pile of wood that actually fell on him injuring his right leg this is somewhat erythematous has me concerned little bit about cellulitis though there is  not really a good area to culture at this point. 10/24/2020 upon evaluation today patient appears to be doing well with regard  to his heel ulcer. He is showing signs of improvement which is great news. His right leg is completely healed. Overall I feel like he is doing excellent and there is no signs of infection. 11/07/2020 upon evaluation today patient appears to be doing well with regard to his heel ulcer. He tells me that last week when he was unable to come in his wife actually thought that the wound was very close to closing if not closed. Then it began to "reopen again". I really feel like what may have happened as the collagen may have dried over the wound bed and that because that misunderstanding with thinking that the wound was healing. With that being said I did not see it last week I do not know that for certain. Either way I feel like he is doing great today I see no signs of infection at this point. 11/26/2020 upon inspection today patient appears to be doing decently well in regard to his heel ulcer. He has been tolerating the dressing changes without complication. Fortunately there is no sign of active infection at this time. No fevers, chills, nausea, vomiting, or diarrhea. 12/10/2020 upon evaluation today patient appears to be doing fairly well in regard to the wound on his heel as well as what appears to be a new wound of the left first metatarsal head plantar aspect. This seems to be an area that was callus that has split as the patient tells me has been trying to walk on his toes more has probably where this came from. With that being said there does not appear to be signs of active infection which is great news. LEQUAN, VILLARROEL ([id number]) [id number] Page 8 of 12 12/17/2020 upon evaluation today patient actually appears to be making good progress currently. Fortunately there is no evidence of active infection at this time. Overall I feel like he is very close to complete closure. 12/26/2020 upon evaluation today patient appears to be doing excellent in regard to his wounds. In  fact I am not certain that these are not even completely healed on initial inspection. Overall I am very pleased with where things stand today. Good news is that they are healed he is actually get ready to go out of town and that will be helpful as well as he will be a full part of the time. Readmission: 02/04/2021 upon evaluation today patient appears to be doing well at this point in regard to his left heel that I previously saw him for. Unfortunately he is having issues with his right lower extremity. He has significant wounds at this point he also has erythema noted there is definite signs of cellulitis which is unfortunate. With that being said I think we do need to address this sooner rather than later. The good news is he did have a nice trip to the beach. He tells me that he had no issues during that time. 4/27; patient on Bactrim. Culture showed methicillin sensitive staph aureus therefore the Bactrim should be effective. 2 small areas on the right leg are healed the area on the mid aspect of the tibia almost 100% covered in a very adherent necrotic debris. We have been using silver alginate 02/20/2021 upon evaluation today patient appears to be doing well with regard to his wound. He is showing signs of improvement which is great news overall very pleased  with where things stand today. No fevers, chills, nausea, vomiting, or diarrhea. 02/27/2021 upon evaluation today patient appears to be doing well with regard to his leg ulcer. He is tolerating the dressing changes and overall appears to be doing quite excellent. I am extremely pleased with where things stand and overall I think patient is making great progress. There is no sign of active infection at this time which is also great news. 03/06/2021 upon evaluation today patient appears to be doing excellent in regard to his wounds. In fact he appears to be completely healed today based on what I am seeing. This is excellent news and overall I am  extremely pleased with where he stands. Overall the patient is happy to hear this as well this has been quite sometime coming. Readmission: 04/01/2021 patient unfortunately returns for readmission today. He tells me that he has been wearing his compression socks on the right leg daily and he does not really know what is going on and why his legs are doing what they are doing. We did therefore go ahead and probe deeper into exactly what has been going on with him today. Subsequently the patient tells me that when he gets up and what we would call "first thing in the morning" are really 2 different things". He does not tend to sleep well so he tells me that he will often wake up at 3:00 in the morning. He will then potentially going into the living room to get in his chair where he may read a book for a little while and then potentially fall asleep back in his chair. He then subsequently wake up around 5:00 or so and then get up and in his words "putter around". This often will end with him proceeding at some point around 8 AM or 9 AM to putting on his compression socks. With that being said this means that anywhere from the 3:00 in the morning till roughly around 9:00 in the morning he has no compression on yet he is sitting in his recliner, walking around and up and about, and this is at least 5 to 6 hours of noncompressed time. That may be our issue here but I did not realize until we discussed this further today. Fortunately there does not appear to be any signs of active infection at this time which is great news. With that being said he has multiple wounds of the bilateral lower extremities. 04/10/2021 upon evaluation today patient appears to be doing well with regard to his legs for the most part. He does look like he had some injury where his wrap slid down nonetheless he did some "doctoring on them". I do feel like most of the areas honestly have cleared back up which is great news. There does  not appear to be any signs of active infection at this time which is also great news. No fevers, chills, nausea, vomiting, or diarrhea. 04/18/2021 upon evaluation today patient appears to be doing well with regard to his wounds in fact he appears to be completely healed which is great news. Fortunately there does not appear to be any signs of active infection which is great news. No fevers, chills, nausea, vomiting, or diarrhea. READMISSION 07/28/2021 This is a now 77 year old man we have had in this clinic for quite a bit of this year. 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 He wears compression stockings. 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 He comes in an old running shoes although he says he wears better shoes at home. His history is that he noticed a blister in the right posterior heel. This open. He has been applying Neosporin to it currently the wound measures 2 x 1.4 cm. 100% slough covered. Not really offloading this in any rigorous way. Past medical history is essentially unchanged he has paroxysmal atrial fibrillation on Coumadin type 2 diabetes with a recent hemoglobin A1c of 7 on metformin. ABI in our clinic was 0.98 on the right 08/05/2021 upon evaluation today patient appears to be doing well with regard to his heel ulcer. Fortunately there does not appear to be any signs of active infection at this time. Overall I been very pleased with where things seem to be currently as far as the wound healing is concerned. Again this is first a lot of seeing him Dr. Dellia Nims readmitted him last week. Nonetheless I think we are definitely making some progress here. 08/11/2021 upon evaluation today patient's wound on the heel actually showing signs of excellent improvement. I am actually very pleased with where things stand currently. No fevers, chills, nausea, vomiting, or  diarrhea. There does not appear to be any need for sharp debridement today either which is also great news. 08/25/2021 upon evaluation today patient appears to be doing well with regard to his heel ulcer. This is actually looking significantly improved compared to last time I saw him. Fortunately there does not appear to be any evidence of active infection at this time. No fevers, chills, nausea, vomiting, or diarrhea. 09/01/2021 upon evaluation today patient appears to be doing decently well in regard to his wounds. Fortunately there does not appear to be any signs of active infection at this time which is great news and overall very pleased in that regard. I do not see any evidence of active infection systemically which is great news as well. No fevers, chills, nausea, vomiting, or diarrhea. I think that the heel is making excellent progress. 09/15/2021 upon evaluation today patient's heel unfortunately is significantly worse compared to what it was previous. I do believe that at this time he would benefit from switching back to something a little bit more offloading from the cushion shoe he has right now this is more of a heel protector what I really need is a Prevalon offloading boot which I think is good to do a lot better for him than just the small heel protector. 09/23/2021 upon evaluation today patient appears to be doing a little better in regards to last week's visit. Overall I think that he is making good progress which is great news and there does not appear to be any evidence of active infection at this time. No fevers, chills, nausea, vomiting, or diarrhea. 09/30/2021 upon evaluation patient's heel is actually showing signs of good improvement which is great news. Fortunately there does not appear to be any evidence of active infection locally nor systemically at this point. No fevers, chills, nausea, vomiting, or diarrhea. 10/07/2021 upon evaluation today patient appears to be doing well  currently in regard to his heel ulcer. He has been tolerating the dressing changes without complication. Fortunately I do not see any signs of active infection at this time which is great news. No fevers, chills, nausea, vomiting, or diarrhea. 12/27; wound is measuring smaller. We have been using silver alginate DAVIOUS, OEN ([id number]) [id number] Page 9 of 12 10/28/2021 upon evaluation today patient appears to be  doing well currently in regard to his heel ulcer. I am actually very pleased with where things stand and I think he is making excellent progress. Fortunately I do not see any signs of active infection locally nor systemically at this point which is great news. Nonetheless I do believe that the patient would benefit from a new offloading shoe and has been using it Velcro is no longer functioning properly and he tells me that he almost tripped and fell because of that today. Obviously I do not want him following that would be very bad. 11/11/2021 upon evaluation today patient appears to be doing excellent in regard to his wound. In fact this appears to be completely healed based on what I see currently. I do not see any signs of anything open or draining and I did double check just by clearing away some of the callus around the edges of the wound to ensure that there was nothing still open or hiding underneath. Once this was cleared away it appeared that the patient was doing significantly better at this time which is great news and there was nothing actually open. Readmission: 06-19-2022 upon evaluation today patient appears to be doing well currently in regard to his heel ulcer all things considered. He unfortunately is having a bit of breakdown in general as far as the heel is concerned not nearly as bad as what we noted last time he was here in the clinic. The good news is I do think that this should hopefully heal much more rapidly than what we noted last  time. His past medical history has not changed he is on Coumadin. 07-03-2022 upon evaluation today patient appears to be doing better in regard to his heel. We will start to see some improvement here which is good news. Fortunately I do not see any evidence of infection locally or systemically at this time which is great news. No fevers, chills, nausea, vomiting, or diarrhea. 07-10-2022 upon evaluation today patient appears to be doing well currently in regard to his wound from the callus standpoint around the edges. Unfortunately from an actual wound bed standpoint he has some deep tissue injury noted at this point. Again I am not sure exactly what is going on that is causing this pressure to the region but he is definitely gotten pressure that is occurring and causing this to not heal as effectively as what we would like to see. I discussed with him today that he may need to at night when he sleeping use a pillow up underneath his calf in order to prevent the heel from touching the bed at all. I also think that it may be beneficial for him to monitor throughout his day and make sure there is no other time when he is getting the pressure to the heel. 07-23-2022 upon evaluation today patient appears to be doing poorly currently in regard to his heel ulcer. He has been tolerating the dressing changes without complication. Unfortunately I feel like he may be infected based on what I am seeing. 07-28-2022 I did review patient's culture results which showed positive for Proteus as well as Staphylococcus both of which would be treated with the Bactrim DS that I gave him at the last visit. This is good news and hopefully means that we should be able to apply the cast today as long as the wound itself does not appear to be doing poorly. Patient's wound bed actually showed signs of doing quite well and I am very  pleased with where things stand and I do not see any signs of worsening overall which is great  news. 08-04-2022 upon evaluation today patient appears to be doing well currently in regard to his heel ulcer. I am actually very pleased with where things stand and I do think this is looking significantly better. With regard to his right shin that is also showing signs of improvement which is great news. 08-10-2022 upon evaluation today patient's wound on the heel actually appears to be doing significantly better. In regard to the right anterior shin this is showing signs of healing it might even be completely healed but I still get a monitor I cannot get anything to fill away but also could not see where there was anything actually draining at this point. I am tending to think healed but I want a monitor 1 more week before I call it for sure. 08-17-2022 upon evaluation today patient appears to be doing well currently in regard to his heel. Fortunately he is tolerating the dressing changes without complication. I do not see any signs of infection and overall I think he is making good progress here. We did get approval for the Apligraf but I think he had a $295 co-pay that would have to be paid per application. With that being said as good as he is doing right now I do not think that is necessary but is still an option depending on how things progress if we need to speed this up quite a bit. 08/24/2022; this patient has a wound on his left heel in the setting of type 2 diabetes. We have been using Prisma. He has a heel offloading boot 08-31-2022 upon evaluation today patient appears to be doing poorly in regard to his heel ulcer which is still showing signs of bruising I am just not as happy as I was when I saw him 2 weeks ago with this. He saw Dr. Dellia Nims last week. Also did not look good at that point apparently. Nonetheless I think that the patient is still continuing to have some counterpressure getting to the wound bed unfortunately. 09-07-2022 upon evaluation today patient appears to be doing well  currently in regard to his heel ulcer. He has been tolerating the dressing changes without complication. Fortunately there does not appear to be any signs of active infection locally or systemically at this time. Fortunately I do not see any evidence of active infection at this time. 09-15-2022 upon evaluation today patient appears to be doing well currently in regard to his legs which in fact appear to be completely healed this is great news. With that being said his heel does have some erythema and warmth as well as drainage I am concerned about cellulitis at this location. 09-21-2022 upon evaluation today patient's wound is actually showing signs of being a little bit smaller but still we are not doing nearly as well as what I would like to see. Fortunately I do not see any signs of infection at this time which is great news and overall I am extremely pleased in that regard. With that being said I do think that he needs to continue to elevate his leg although the edema is under great control with a compression wrap I think switching to a different dressing topically may be beneficial. My suggestion is to switch him over to a Iodoflex dressing. 09-28-2022 upon evaluation today patient appears to be doing well with regard to his heel ulcer. This is actually showing signs of  improvement which is great news and overall I am extremely pleased with where we stand today. 10-06-2022 upon evaluation today patient actually appears to be making excellent progress at this point. Fortunately there does not appear to be any signs of active infection locally nor systemically which is great news and overall I am extremely pleased with where we stand. I do feel like he is actually making some pretty good progress here as we switch to the wrap along with the Iodoflex. 12/26; patient has a wound on the tip of his left heel. This is not a weightbearing surface. He is using a heel offloading boot and carefully offloading  this at other times during the day. He is using Iodoflex and Zetuvitunder 3 layer compression 10-22-2022 upon evaluation today patient appears to be doing well currently in regard to his wound. He has been tolerating the dressing changes without complication. Fortunately there does not appear to be any signs of active infection locally nor systemically at this time. No fevers, chills, nausea, vomiting, or diarrhea. 08-30-2023 upon evaluation today patient appears to be doing well currently in regard to his heel ulcer which is showing signs of good improvement. Fortunately I do not see any evidence of infection locally nor systemically which is great news and overall I am extremely pleased with where we stand. No fevers, chills, nausea, vomiting, or diarrhea. Unfortunately he does have a wound on the side of his fifth toe where I think this did rub on the shoe. 11-05-2022 upon evaluation today patient still still showing some signs of pressure at this point and there may have even been some fluid collection at this time. Fortunately I do not see any evidence of active infection locally nor systemically which is great news and overall I am extremely pleased with where we stand. No fevers, chills, nausea, vomiting, or diarrhea. 1/25; the patient's area on the dorsal aspect of the left fifth toe is healed. We have been using Iodoflex to the area on the left heel. The lateral wound is slightly down in length. 11-19-2022 upon evaluation today patient appears to be doing well currently in regard to his wound. He has been tolerating the dressing changes without complication. Fortunately there does not appear to be any signs of infection we been using Iodoflex up to this point. KWAMANE, RESPICIO ([id number]) [id number] Page 10 of 12 11-26-2022 upon evaluation today patient appears to be doing poorly currently in regard to his wound. He has been tolerating the dressing changes with  the Iodoflex without complication. With that being said he does not have any signs of active infection systemically though locally I do feel like there is some evidence of infection here. 12-03-2022 upon evaluation today patient appears to be doing well currently in regard to his wound this is better with the Bactrim compared to last week still there was some significant slough and biofilm buildup which is going require sharp debridement at this point. Fortunately I do not see any signs of active infection locally nor systemically which is great news. 12-11-2022 upon evaluation today patient actually appears to be doing significantly better at this time in regard to his heel. I am actually very pleased with where things stand currently. There is no signs of active infection locally or systemically at this point. Objective Constitutional Well-nourished and well-hydrated in no acute distress. Vitals Time Taken: 7:39 AM, Height: 70 in, Weight: 265 lbs, BMI: 38, Temperature: 98.3 F, Pulse: 70 bpm, Respiratory Rate: 18 breaths/min, Blood Pressure:  167/75 mmHg. Respiratory normal breathing without difficulty. Psychiatric this patient is able to make decisions and demonstrates good insight into disease process. Alert and Oriented x 3. pleasant and cooperative. General Notes: Upon inspection patient's wound bed showed signs of good granulation and epithelization currently. I am actually very pleased with where we stand and I do believe that he is in the right pathway here as far as getting this healed. He is going to keep doing what he has been doing over the past week for the next week. Integumentary (Hair, Skin) Wound #10 status is Open. Original cause of wound was Gradually Appeared. The date acquired was: 06/05/2022. The wound has been in treatment 25 weeks. The wound is located on the Left Calcaneus. The wound measures 1.4cm length x 2.5cm width x 0.2cm depth; 2.749cm^2 area and 0.55cm^3 volume.  There is Fat Layer (Subcutaneous Tissue) exposed. There is a medium amount of serosanguineous drainage noted. The wound margin is flat and intact. There is small (1-33%) pink granulation within the wound bed. There is a large (67-100%) amount of necrotic tissue within the wound bed including Adherent Slough. 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 There was a Excisional Skin/Subcutaneous Tissue Debridement with a total area of 3.5 sq cm performed by Tommie Sams., PA-C. With the following instrument(s): Curette to remove Viable and Non-Viable tissue/material. Material removed includes Subcutaneous Tissue and Slough and. A time out was conducted at 08:46, prior to the start of the procedure. A Minimum amount of bleeding was controlled with Pressure. The procedure was tolerated well. Post Debridement Measurements: 1.4cm length x 2.5cm width x 0.2cm depth; 0.55cm^3 volume. Character of Wound/Ulcer Post Debridement is stable. Severity of Tissue Post Debridement is: Fat layer exposed. Post procedure Diagnosis Wound #10: Same as Pre-Procedure Plan Follow-up Appointments: Wound #10 Left Calcaneus: Return Appointment in 1 week. Bathing/ Shower/ Hygiene: May shower; gently cleanse wound with antibacterial soap, rinse and pat dry prior to dressing wounds Non-Wound Condition: Additional non-wound orders/instructions: - Apply 40% Urea cream to dry cracked skin on feet. JARDON, HELLWEGE ([id number]) [id number] Page 11 of 12 Off-Loading: Open toe surgical shoe with peg assist. Medications-Please add to medication  list.: P.O. Antibiotics - continue Bactrim WOUND #10: - Calcaneus Wound Laterality: Left Topical: Gentamicin 3 x Per Week/30 Days Discharge Instructions: Apply as directed by provider. Prim Dressing: Hydrofera Blue Ready Transfer Foam, 2.5x2.5 (in/in) (Dispense As Written) 3 x Per Week/30 Days ary Discharge Instructions: Apply Hydrofera Blue Ready to wound bed as directed Secondary Dressing: ABD Pad 5x9 (in/in) (Generic) 3 x Per Week/30 Days Discharge Instructions: Cover with ABD pad Secured With: Kerlix Roll Sterile or Non-Sterile 6-ply 4.5x4 (yd/yd) 3 x Per Week/30 Days Discharge Instructions: Apply Kerlix as directed Secured With: Tubigrip Size D, 3x10 (in/yd) 3 x Per Week/30 Days 1. I am good to suggest that we continue with the wound care measures as before and the patient is in agreement with plan. This includes the use of the [facility] dressing which I think is doing a good job. Will also get a continue with ABD pad to cover followed by the roll [name] to secure in place. Using Tubigrip size D. 2.  I am also going to continue to recommend that he try to offload aggressively keeping pressure off of the heels more so this he can do the better. We will see patient back for reevaluation in 1 week here in the clinic. If anything worsens or changes patient will contact our office for additional recommendations. Electronic Signature(s) Signed: 12/11/2022 1:23:02 PM By: Worthy Keeler PA-C Entered By: Worthy Keeler on 12/11/2022 13:23:01 -------------------------------------------------------------------------------- SuperBill Details Patient Name: Date of Service: [name], [name]. 12/11/2022 Medical Record Number: [id number] Patient Account Number: [id number] Date of Birth/Sex: Treating RN: June 22, 1946 (77 y.o. [name] Primary Care Provider: Fulton Reek Other Clinician: Massie Kluver Referring Provider: Treating Provider/Extender: Maurilio Lovely Weeks in  Treatment: 25 Diagnosis Coding ICD-10 Codes Code Description E11.621 Type 2 diabetes mellitus with foot ulcer 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sed E11.42 Type 2 diabetes mellitus with diabetic polyneuropathy Facility Procedures : CPT4 Code: [id number] Description: [id number] - DEB SUBQ TISSUE 20 SQ CM/< ICD-10 Diagnosis Description 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sed Modifier: Quantity: 1 Physician Procedures : CPT4 Code Description Modifier [id number] - WC PHYS SUBQ TISS 20 SQ CM ICD-10 Diagnosis Description 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sed ELBER, GOSCINSKI ([id number])  [id number] Page 12 o Quantity: 1 f 12 Electronic Signature(s) Signed: 12/11/2022 1:23:28 PM By: Worthy Keeler PA-C Entered By: Worthy Keeler on 12/11/2022 13:23:27

## 2022-12-14 NOTE — Progress Notes (Signed)
[name], [name] ([id number]) [id number] Page 1 of 9 Visit Report for 12/11/2022 Arrival Information Details Patient Name: Date of Service: [name], [name] [facility] J. 12/11/2022 7:30 A M Medical Record Number: [id number] Patient Account Number: [id number] Date of Birth/Sex: Treating RN: 08/24/1946 (77 y.o. [name] Primary Care Medhansh Brinkmeier: Fulton Reek Other Clinician: Massie Kluver Referring Deavion Dobbs: Treating Hever Castilleja/Extender: [name] in Treatment: 25 Visit Information History Since Last Visit All ordered tests and consults were completed: No Patient Arrived: [name] Added or deleted any medications: No Arrival Time: 07:36 Any new allergies or adverse reactions: No Transfer Assistance: None Had a fall or experienced change in No Patient Identification Verified: Yes activities of daily living that may affect Secondary Verification Process Completed: Yes risk of falls: Patient Requires Transmission-Based Precautions: No Signs or symptoms of abuse/neglect since No Patient Has Alerts: Yes last visito Patient Alerts: Patient on Blood Thinner Hospitalized since last visit: No Warfarin Implantable device outside of the clinic No Type II Diabetic excluding cellular tissue based products placed in the center since last visit: Has Dressing in Place as Prescribed: Yes Has Footwear/Offloading in Place as Yes Prescribed: Left: Surgical Shoe with Pressure Relief Insole Pain Present Now: No Electronic Signature(s) Signed: 12/14/2022 9:50:02 AM By: Massie Kluver Entered By: Massie Kluver on 12/11/2022 07:38:54 -------------------------------------------------------------------------------- Clinic Level of Care Assessment Details Patient Name: Date of Service: [name], [name] [facility] J. 12/11/2022 7:30 A M Medical Record Number: [id number] Patient Account Number: [id number] Date of Birth/Sex: Treating RN: 04-26-1946 (77 y.o. [name] Primary Care Kasy Iannacone: Fulton Reek Other Clinician: Massie Kluver Referring Khadim Lundberg: Treating Jaman Aro/Extender: [name] in Treatment: 25 Clinic Level of Care Assessment Items TOOL 1 Quantity Score [name], SPERRAZZA ([id number]) [phone].pdf Page 2 of 9 '[]'$  - 0 Use when EandM and Procedure is performed on INITIAL visit ASSESSMENTS - Nursing Assessment / Reassessment '[]'$  - 0 General Physical Exam (combine w/ comprehensive assessment (listed just below) when performed on new pt. evals) '[]'$  - 0 Comprehensive Assessment (HX, ROS, Risk Assessments, Wounds Hx, etc.) ASSESSMENTS - Wound and Skin Assessment / Reassessment '[]'$  - 0 Dermatologic / Skin Assessment (not related to wound area) ASSESSMENTS - Ostomy and/or Continence Assessment and Care '[]'$  - 0 Incontinence Assessment and Management '[]'$  - 0 Ostomy Care Assessment and Management (repouching, etc.) PROCESS - Coordination of Care '[]'$  - 0 Simple Patient / Family Education for ongoing care '[]'$  - 0 Complex (extensive) Patient / Family Education for ongoing care '[]'$  - 0 Staff obtains Programmer, systems, Records, T Results / Process Orders est '[]'$  - 0 Staff telephones HHA, Nursing Homes / Clarify orders / etc '[]'$  - 0 Routine Transfer to another Facility (non-emergent condition) '[]'$  - 0 Routine Hospital Admission (non-emergent condition) '[]'$  - 0 New Admissions / Biomedical engineer / Ordering NPWT Apligraf, etc. , '[]'$  - 0 Emergency Hospital Admission (emergent condition) PROCESS - Special Needs '[]'$  - 0 Pediatric / Minor Patient Management '[]'$  - 0 Isolation Patient Management '[]'$  - 0 Hearing / Language / Visual special needs '[]'$  - 0 Assessment of Community assistance (transportation, D/C planning, etc.) '[]'$  - 0 Additional assistance / Altered mentation '[]'$  - 0 Support Surface(s) Assessment (bed, cushion, seat, etc.) INTERVENTIONS - Miscellaneous '[]'$  - 0 External ear exam '[]'$  -  0 Patient Transfer (multiple staff / Civil Service fast streamer / Similar devices) '[]'$  - 0 Simple Staple / Suture removal (25 or less) '[]'$  - 0 Complex Staple / Suture removal (26 or more) '[]'$  -  0 Hypo/Hyperglycemic Management (do not check if billed separately) '[]'$  - 0 Ankle / Brachial Index (ABI) - do not check if billed separately Has the patient been seen at the hospital within the last three years: Yes Total Score: 0 Level Of Care: ____ Electronic Signature(s) Signed: 12/14/2022 9:50:02 AM By: Massie Kluver Entered By: Massie Kluver on 12/11/2022 08:49:27 Encounter Discharge Information Details -------------------------------------------------------------------------------- [name] ([id number]) [id number] Page 3 of 9 Patient Name: Date of Service: [name], [name] [facility] J. 12/11/2022 7:30 A M Medical Record Number: [id number] Patient Account Number: [id number] Date of Birth/Sex: Treating RN: 11/04/1945 (77 y.o. [name] Primary Care Nyiah Pianka: Fulton Reek Other Clinician: Massie Kluver Referring Gussie Murton: Treating Braidon Chermak/Extender: [name] in Treatment: 25 Encounter Discharge Information Items Post Procedure Vitals Discharge Condition: Stable Temperature (F): 98.3 Ambulatory Status: Walker Pulse (bpm): 70 Discharge Destination: Home Respiratory Rate (breaths/min): 18 Transportation: Private Auto Blood Pressure (mmHg): 167/75 Accompanied By: self Schedule Follow-up Appointment: Yes Clinical Summary of Care: Electronic Signature(s) Signed: 12/14/2022 9:50:02 AM By: Massie Kluver Entered By: Massie Kluver on 12/11/2022 09:33:27 -------------------------------------------------------------------------------- Lower Extremity Assessment Details Patient Name: Date of Service: [name], [name] [name]. 12/11/2022 7:30 A M Medical Record Number: [id number] Patient Account Number: [id number] Date of Birth/Sex: Treating RN: 1946-09-09  (77 y.o. [name] Primary Care Trayvond Viets: Fulton Reek Other Clinician: Massie Kluver Referring Torey Regan: Treating Draven Natter/Extender: Maurilio Lovely Weeks in Treatment: 25 Edema Assessment Assessed: Shirlyn Goltz: Yes] [Right: No] Edema: [Left: Ye] [Right: s] Calf Left: Right: Point of Measurement: 33 cm From Medial Instep 39.5 cm Ankle Left: Right: Point of Measurement: 11 cm From Medial Instep 22.4 cm Vascular Assessment Pulses: Dorsalis Pedis Palpable: [Left:Yes] Electronic Signature(s) Signed: 12/11/2022 12:25:37 PM By: [name], BSN, RN, CWS, [name] RN, BSN Signed: 12/14/2022 9:50:02 AM By: Massie Kluver Entered By: Massie Kluver on 12/11/2022 07:48:15 [name] ([id number]) [id number] Page 4 of 9 -------------------------------------------------------------------------------- Multi Wound Chart Details Patient Name: Date of Service: [name], [name] [name]. 12/11/2022 7:30 A M Medical Record Number: [id number] Patient Account Number: [id number] Date of Birth/Sex: Treating RN: 03/02/1946 (77 y.o. [name] Primary Care Curtisha Bendix: Fulton Reek Other Clinician: Massie Kluver Referring Brydan Downard: Treating Haniya Fern/Extender: Maurilio Lovely Weeks in Treatment: 25 Vital Signs Height(in): 32 Pulse(bpm): 74 Weight(lbs): 3 Blood Pressure(mmHg): 167/75 Body Mass Index(BMI): 38 Temperature(F): 98.3 Respiratory Rate(breaths/min): 18 [10:Photos:] [N/A:N/A] Left Calcaneus N/A N/A Wound Location: Gradually Appeared N/A N/A Wounding 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 N/A N/A Secondary Etiology: Cataracts, Arrhythmia, Coronary N/A N/A Comorbid History: Artery Disease, Hypertension, Type II Diabetes, Osteoarthritis, Neuropathy 06/05/2022 N/A N/A Date Acquired: 25 N/A N/A Weeks of Treatment: Open N/A N/A Wound Status: No N/A N/A Wound  Recurrence: 1.4x2.5x0.2 N/A N/A Measurements L x W x D (cm) 2.749 N/A N/A A (cm) : rea 0.55 N/A N/A Volume (cm) : -94.40% N/A N/A % Reduction in A rea: -94.30% N/A N/A % Reduction in Volume: Grade 1 N/A N/A Classification: Medium N/A N/A Exudate A mount: Serosanguineous N/A N/A Exudate Type: red, brown N/A N/A Exudate Color: Flat and Intact N/A N/A Wound Margin: Small (1-33%) N/A N/A Granulation A mount: Pink N/A N/A Granulation Quality: Large (67-100%) N/A N/A Necrotic A mount: Fat Layer (Subcutaneous Tissue): Yes N/A N/A Exposed Structures: Fascia: No Tendon: No Muscle: No Joint: No Bone: No Small (1-33%) N/A N/A Epithelialization: Treatment Notes Electronic Signature(s) Signed: 12/14/2022 9:50:02 AM By: Massie Kluver Entered By:  Massie Kluver on 12/11/2022 07:48:26 [name], [name] ([id number]) [id number] Page 5 of 9 -------------------------------------------------------------------------------- Multi-Disciplinary Care Plan Details Patient Name: Date of Service: [name], [name] [facility] J. 12/11/2022 7:30 A M Medical Record Number: [id number] Patient Account Number: [id number] Date of Birth/Sex: Treating RN: [birth year] (77 y.o. [name], [name] Primary Care Elah Avellino: Fulton Reek Other Clinician: Massie Kluver Referring Alaijah Gibler: Treating Silas Muff/Extender: [name] in Treatment: 25 Active Inactive Pressure Nursing Diagnoses: Knowledge deficit related to causes and risk factors for pressure ulcer development Knowledge deficit related to management of pressures ulcers Potential for impaired tissue integrity related to pressure, friction, moisture, and shear Goals: Patient will remain free from development of additional pressure ulcers Date Initiated: 06/19/2022 Date Inactivated: 07/28/2022 Target Resolution Date: 06/19/2022 Goal Status: Met Patient/caregiver will verbalize risk factors for pressure ulcer  development Date Initiated: 06/19/2022 Date Inactivated: 07/28/2022 Target Resolution Date: 06/19/2022 Goal Status: Met Patient/caregiver will verbalize understanding of pressure ulcer management Date Initiated: 06/19/2022 Target Resolution Date: 08/14/2022 Goal Status: Active Interventions: Assess: immobility, friction, shearing, incontinence upon admission and as needed Assess offloading mechanisms upon admission and as needed Assess potential for pressure ulcer upon admission and as needed Provide education on pressure ulcers Notes: Wound/Skin Impairment Nursing Diagnoses: Impaired tissue integrity Goals: Patient/caregiver will verbalize understanding of skin care regimen Date Initiated: 06/19/2022 Date Inactivated: 07/10/2022 Target Resolution Date: 06/19/2022 Goal Status: Met Ulcer/skin breakdown will have a volume reduction of 30% by week 4 Date Initiated: 06/19/2022 Date Inactivated: 07/28/2022 Target Resolution Date: 07/17/2022 Goal Status: Unmet Unmet Reason: infection Ulcer/skin breakdown will have a volume reduction of 50% by week 8 Date Initiated: 07/28/2022 Target Resolution Date: 08/14/2022 Goal Status: Active Interventions: Assess patient/caregiver ability to obtain necessary supplies Assess patient/caregiver ability to perform ulcer/skin care regimen upon admission and as needed Assess ulceration(s) every visit Provide education on ulcer and skin care Treatment Activities: [name], [name] ([id number]) [id number] Page 6 of 9 Skin care regimen initiated : 06/19/2022 Topical wound management initiated : 06/19/2022 Notes: Electronic Signature(s) Signed: 12/11/2022 12:25:37 PM By: [name], BSN, RN, CWS, [name] RN, BSN Signed: 12/14/2022 9:50:02 AM By: Massie Kluver Entered By: Massie Kluver on 12/11/2022 08:57:10 -------------------------------------------------------------------------------- Pain Assessment Details Patient Name: Date of Service: [name], [name] [name] J. 12/11/2022 7:30 A M Medical Record Number: [id number] Patient Account Number: [id number] Date of Birth/Sex: Treating RN: [birth year] (77 y.o. [name] Primary Care Hamed Debella: Fulton Reek Other Clinician: Massie Kluver Referring Tavares Levinson: Treating Reneisha Stilley/Extender: Maurilio Lovely Weeks in Treatment: 25 Active Problems Location of Pain Severity and Description of Pain Patient Has Paino No Site Locations Pain Management and Medication Current Pain Management: Electronic Signature(s) Signed: 12/11/2022 12:25:37 PM By: [name], BSN, RN, CWS, [name] RN, BSN Signed: 12/14/2022 9:50:02 AM By: Massie Kluver Entered By: Massie Kluver on 12/11/2022 07:41:33 [name] ([id number]) [id number] Page 7 of 9 -------------------------------------------------------------------------------- Patient/Caregiver Education Details Patient Name: Date of Service: [name], [name] [name] J. 2/23/2024andnbsp7:30 A M Medical Record Number: [id number] Patient Account Number: [id number] Date of Birth/Gender: Treating RN: 09/30/1946 (77 y.o. [name] Primary Care Physician: Fulton Reek Other Clinician: Massie Kluver Referring Physician: Treating Physician/Extender: [name] in Treatment: 25 Education Assessment Education Provided To: Patient Education Topics Provided Wound/Skin Impairment: Handouts: Other: continue wound care as directed Methods: Explain/Verbal Responses: State content correctly Electronic Signature(s) Signed: 12/14/2022 9:50:02 AM By: Massie Kluver Entered By: Massie Kluver on 12/11/2022 09:32:19 -------------------------------------------------------------------------------- Wound Assessment Details Patient Name: Date of Service: [name],  [name] [name] J. 12/11/2022 7:30 A M Medical Record Number: [id number] Patient Account Number: [id number] Date of Birth/Sex: Treating RN: 05/02/1946 (77 y.o.  [name] Primary Care Marshun Duva: Fulton Reek Other Clinician: Massie Kluver Referring Dimetrius Montfort: Treating Stephene Alegria/Extender: Maurilio Lovely Weeks in Treatment: 25 Wound Status Wound Number: 10 Primary Diabetic Wound/Ulcer of the Lower Extremity Etiology: Wound Location: Left Calcaneus Secondary Pressure Ulcer Wounding Event: Gradually Appeared Etiology: Date Acquired: 06/05/2022 Wound Open Weeks Of Treatment: 25 Status: Clustered Wound: No Comorbid Cataracts, Arrhythmia, Coronary Artery Disease, Hypertension, History: Type II Diabetes, Osteoarthritis, Neuropathy Photos [name], [name] ([id number]) [phone].pdf Page 8 of 9 Wound Measurements Length: (cm) 1.4 Width: (cm) 2.5 Depth: (cm) 0.2 Area: (cm) 2.749 Volume: (cm) 0.55 % Reduction in Area: -94.4% % Reduction in Volume: -94.3% Epithelialization: Small (1-33%) Wound Description Classification: Grade 1 Wound Margin: Flat and Intact Exudate Amount: Medium Exudate Type: Serosanguineous Exudate Color: red, brown Foul Odor After Cleansing: No Slough/Fibrino Yes Wound Bed Granulation Amount: Small (1-33%) Exposed Structure Granulation Quality: Pink Fascia Exposed: No Necrotic Amount: Large (67-100%) Fat Layer (Subcutaneous Tissue) Exposed: Yes Necrotic Quality: Adherent Slough Tendon Exposed: No Muscle Exposed: No Joint Exposed: No Bone Exposed: No Treatment Notes Wound #10 (Calcaneus) Wound Laterality: Left Cleanser Peri-Wound Care Topical Mupirocin Ointment Discharge Instruction: Apply as directed by Orville Widmann. Primary Dressing Hydrofera Blue Ready Transfer Foam, 2.5x2.5 (in/in) Discharge Instruction: Apply Hydrofera Blue Ready to wound bed as directed Secondary Dressing ABD Pad 5x9 (in/in) Discharge Instruction: Cover with ABD pad Secured With Kerlix Roll Sterile or Non-Sterile 6-ply 4.5x4 (yd/yd) Discharge Instruction: Apply Kerlix as directed Tubigrip  Size D, 3x10 (in/yd) Compression Wrap Compression Stockings Add-Ons Electronic Signature(s) Signed: 12/11/2022 12:25:37 PM By: [name], BSN, RN, CWS, [name] RN, BSN Signed: 12/14/2022 9:50:02 AM By: Massie Kluver Entered By: Massie Kluver on 12/11/2022 07:47:08 [name] ([id number]) [id number] Page 9 of 9 -------------------------------------------------------------------------------- Vitals Details Patient Name: Date of Service: [name], [name] [name] J. 12/11/2022 7:30 A M Medical Record Number: [id number] Patient Account Number: [id number] Date of Birth/Sex: Treating RN: 1946/04/06 (77 y.o. [name], [name] Primary Care Jaley Yan: Fulton Reek Other Clinician: Massie Kluver Referring Dorrian Doggett: Treating Catherine Oak/Extender: Maurilio Lovely Weeks in Treatment: 25 Vital Signs Time Taken: 07:39 Temperature (F): 98.3 Height (in): 70 Pulse (bpm): 70 Weight (lbs): 265 Respiratory Rate (breaths/min): 18 Body Mass Index (BMI): 38 Blood Pressure (mmHg): 167/75 Reference Range: 80 - 120 mg / dl Electronic Signature(s) Signed: 12/14/2022 9:50:02 AM By: Massie Kluver Entered By: Massie Kluver on 12/11/2022 07:41:29

## 2022-12-17 ENCOUNTER — Encounter: Payer: Medicare HMO | Admitting: Physician Assistant

## 2022-12-17 DIAGNOSIS — E11621 Type 2 diabetes mellitus with foot ulcer: Secondary | ICD-10-CM | POA: Diagnosis not present

## 2022-12-21 NOTE — Progress Notes (Signed)
[name], [name] ([id number]) [id number] Page 1 of 9 Visit Report for 12/17/2022 Arrival Information Details Patient Name: Date of Service: [name], GEO [facility] J. 12/17/2022 1:00 PM Medical Record Number: [id number] Patient Account Number: [id number] Date of Birth/Sex: Treating RN: 09/20/1946 (77 y.o. [name] Primary Care Milagro Belmares: Fulton Reek Other Clinician: Massie Kluver Referring Kaina Orengo: Treating Nikos Anglemyer/Extender: [name] in Treatment: 25 Visit Information History Since Last Visit All ordered tests and consults were completed: No Patient Arrived: Ambulatory Added or deleted any medications: No Arrival Time: 13:05 Any new allergies or adverse reactions: No Transfer Assistance: None Had a fall or experienced change in No Patient Identification Verified: Yes activities of daily living that may affect Secondary Verification Process Completed: Yes risk of falls: Patient Requires Transmission-Based Precautions: No Signs or symptoms of abuse/neglect since last visito No Patient Has Alerts: Yes Hospitalized since last visit: No Patient Alerts: Patient on Blood Thinner Implantable device outside of the clinic excluding No Warfarin cellular tissue based products placed in the center Type II Diabetic since last visit: Has Dressing in Place as Prescribed: Yes Pain Present Now: No Electronic Signature(s) Signed: 12/18/2022 1:35:31 PM By: Massie Kluver Entered By: Massie Kluver on 12/17/2022 13:09:26 -------------------------------------------------------------------------------- Clinic Level of Care Assessment Details Patient Name: Date of Service: [name], GEO [facility] J. 12/17/2022 1:00 PM Medical Record Number: [id number] Patient Account Number: [id number] Date of Birth/Sex: Treating RN: September 25, 1946 (77 y.o. [name] Primary Care [name]: Fulton Reek Other Clinician: Massie Kluver Referring [name]: Treating  [name]/Extender: [name] in Treatment: 25 Clinic Level of Care Assessment Items TOOL 1 Quantity Score '[]'$  - 0 Use when EandM and Procedure is performed on INITIAL visit ASSESSMENTS - Nursing Assessment / Reassessment '[]'$  - 0 General Physical Exam (combine w/ comprehensive assessment (listed just below) when performed on new pt. evals) '[]'$  - 0 Comprehensive Assessment (HX, ROS, Risk Assessments, Wounds Hx, etc.) [name], [name] ([id number]) [phone].pdf Page 2 of 9 ASSESSMENTS - Wound and Skin Assessment / Reassessment '[]'$  - 0 Dermatologic / Skin Assessment (not related to wound area) ASSESSMENTS - Ostomy and/or Continence Assessment and Care '[]'$  - 0 Incontinence Assessment and Management '[]'$  - 0 Ostomy Care Assessment and Management (repouching, etc.) PROCESS - Coordination of Care '[]'$  - 0 Simple Patient / Family Education for ongoing care '[]'$  - 0 Complex (extensive) Patient / Family Education for ongoing care '[]'$  - 0 Staff obtains Programmer, systems, Records, T Results / Process Orders est '[]'$  - 0 Staff telephones HHA, Nursing Homes / Clarify orders / etc '[]'$  - 0 Routine Transfer to another Facility (non-emergent condition) '[]'$  - 0 Routine Hospital Admission (non-emergent condition) '[]'$  - 0 New Admissions / Biomedical engineer / Ordering NPWT Apligraf, etc. , '[]'$  - 0 Emergency Hospital Admission (emergent condition) PROCESS - Special Needs '[]'$  - 0 Pediatric / Minor Patient Management '[]'$  - 0 Isolation Patient Management '[]'$  - 0 Hearing / Language / Visual special needs '[]'$  - 0 Assessment of Community assistance (transportation, D/C planning, etc.) '[]'$  - 0 Additional assistance / Altered mentation '[]'$  - 0 Support Surface(s) Assessment (bed, cushion, seat, etc.) INTERVENTIONS - Miscellaneous '[]'$  - 0 External ear exam '[]'$  - 0 Patient Transfer (multiple staff / Civil Service fast streamer / Similar devices) '[]'$  - 0 Simple Staple / Suture removal (25 or  less) '[]'$  - 0 Complex Staple / Suture removal (26 or more) '[]'$  - 0 Hypo/Hyperglycemic Management (do not check if billed separately) '[]'$  - 0 Ankle / Brachial Index (  ABI) - do not check if billed separately Has the patient been seen at the hospital within the last three years: Yes Total Score: 0 Level Of Care: ____ Electronic Signature(s) Signed: 12/18/2022 1:35:31 PM By: Massie Kluver Entered By: Massie Kluver on 12/17/2022 13:58:10 -------------------------------------------------------------------------------- Encounter Discharge Information Details Patient Name: Date of Service: [name], [name] 12/17/2022 1:00 PM Medical Record Number: [id number] Patient Account Number: [id number] Date of Birth/Sex: Treating RN: 09-29-1946 (76 y.o. [name] Primary Care [name]: Fulton Reek Other Clinician: Massie Kluver Referring [name]: Treating [name]/Extender: [name], [name] ([id number]) [id number] Page 3 of 9 Weeks in Treatment: 25 Encounter Discharge Information Items Post Procedure Vitals Discharge Condition: Stable Temperature (F): 98.0 Ambulatory Status: Walker Pulse (bpm): 66 Discharge Destination: Home Respiratory Rate (breaths/min): 18 Transportation: Private Auto Blood Pressure (mmHg): 135/40 Accompanied By: self Schedule Follow-up Appointment: Yes Clinical Summary of Care: Electronic Signature(s) Signed: 12/18/2022 1:35:31 PM By: Massie Kluver Entered By: Massie Kluver on 12/17/2022 15:39:26 -------------------------------------------------------------------------------- Lower Extremity Assessment Details Patient Name: Date of Service: [name], [name] 12/17/2022 1:00 PM Medical Record Number: [id number] Patient Account Number: [id number] Date of Birth/Sex: Treating RN: 05/07/1946 (77 y.o. [name] Primary Care [name]: Fulton Reek Other Clinician: Massie Kluver Referring  [name]: Treating [name]/Extender: [name] Weeks in Treatment: 25 Edema Assessment Assessed: Shirlyn Goltz: Yes] Patrice Paradise: Yes] Edema: [Left: Yes] [Right: No] Calf Left: Right: Point of Measurement: 33 cm From Medial Instep 41.6 cm 39.5 cm Ankle Left: Right: Point of Measurement: 11 cm From Medial Instep 23.8 cm 24.5 cm Knee To Floor Left: Right: From Medial Instep 51 cm 51 cm Vascular Assessment Pulses: Dorsalis Pedis Palpable: [Left:Yes] Electronic Signature(s) Signed: 12/17/2022 6:12:13 PM By: [name], BSN, RN, CWS, [name] RN, BSN Signed: 12/18/2022 1:35:31 PM By: Massie Kluver Entered By: Massie Kluver on 12/17/2022 13:26:34 [name] ([id number] Page 4 of 9 -------------------------------------------------------------------------------- Multi Wound Chart Details Patient Name: Date of Service: [name], [name] 12/17/2022 1:00 PM Medical Record Number: [id number] Patient Account Number: [id number] Date of Birth/Sex: Treating RN: [birth year] (77 y.o. [name] Primary Care Asiyah Pineau: Fulton Reek Other Clinician: Massie Kluver Referring Rianne Degraaf: Treating Jayleigh Notarianni/Extender: [name] Weeks in Treatment: 25 Vital Signs Height(in): 76 Pulse(bpm): 52 Weight(lbs): 265 Blood Pressure(mmHg): 135/40 Body Mass Index(BMI): 38 Temperature(F): 98.0 Respiratory Rate(breaths/min): 18 [10:Photos:] [N/A:N/A] Left Calcaneus N/A N/A Wound Location: Gradually Appeared N/A N/A Wounding 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 N/A N/A Secondary Etiology: Cataracts, Arrhythmia, Coronary N/A N/A Comorbid History: Artery Disease, Hypertension, Type II Diabetes, Osteoarthritis, Neuropathy 06/05/2022 N/A N/A Date Acquired: 25 N/A N/A Weeks of Treatment: Open N/A N/A Wound Status: No N/A N/A Wound Recurrence: 1.5x3x0.2 N/A N/A Measurements L x  W x D (cm) 3.534 N/A N/A A (cm) : rea 0.707 N/A N/A Volume (cm) : -149.90% N/A N/A % Reduction in A rea: -149.80% N/A N/A % Reduction in Volume: Grade 1 N/A N/A Classification: Medium N/A N/A Exudate A mount: Serosanguineous N/A N/A Exudate Type: red, brown N/A N/A Exudate Color: Flat and Intact N/A N/A Wound Margin: Small (1-33%) N/A N/A Granulation A mount: Pink N/A N/A Granulation Quality: Large (67-100%) N/A N/A Necrotic A mount: Fat Layer (Subcutaneous Tissue): Yes N/A N/A Exposed Structures: Fascia: No Tendon: No Muscle: No Joint: No Bone: No Small (1-33%) N/A N/A Epithelialization: Treatment Notes Electronic Signature(s) Signed: 12/18/2022 1:35:31 PM By: Massie Kluver Entered By: Massie Kluver on  12/17/2022 13:26:45 BALDOMERO, DEBLANC ([id number]) [id number] Page 5 of 9 -------------------------------------------------------------------------------- Multi-Disciplinary Care Plan Details Patient Name: Date of Service: [name], [name] 12/17/2022 1:00 PM Medical Record Number: [id number] Patient Account Number: [id number] Date of Birth/Sex: Treating RN: [birth year] (78 y.o. [name], [name] Primary Care Makana Feigel: Fulton Reek Other Clinician: Massie Kluver Referring Trinidy Masterson: Treating Quinne Pires/Extender: [name] in Treatment: 25 Active Inactive Pressure Nursing Diagnoses: Knowledge deficit related to causes and risk factors for pressure ulcer development Knowledge deficit related to management of pressures ulcers Potential for impaired tissue integrity related to pressure, friction, moisture, and shear Goals: Patient will remain free from development of additional pressure ulcers Date Initiated: 06/19/2022 Date Inactivated: 07/28/2022 Target Resolution Date: 06/19/2022 Goal Status: Met Patient/caregiver will verbalize risk factors for pressure ulcer development Date Initiated: 06/19/2022 Date  Inactivated: 07/28/2022 Target Resolution Date: 06/19/2022 Goal Status: Met Patient/caregiver will verbalize understanding of pressure ulcer management Date Initiated: 06/19/2022 Target Resolution Date: 08/14/2022 Goal Status: Active Interventions: Assess: immobility, friction, shearing, incontinence upon admission and as needed Assess offloading mechanisms upon admission and as needed Assess potential for pressure ulcer upon admission and as needed Provide education on pressure ulcers Notes: Wound/Skin Impairment Nursing Diagnoses: Impaired tissue integrity Goals: Patient/caregiver will verbalize understanding of skin care regimen Date Initiated: 06/19/2022 Date Inactivated: 07/10/2022 Target Resolution Date: 06/19/2022 Goal Status: Met Ulcer/skin breakdown will have a volume reduction of 30% by week 4 Date Initiated: 06/19/2022 Date Inactivated: 07/28/2022 Target Resolution Date: 07/17/2022 Goal Status: Unmet Unmet Reason: infection Ulcer/skin breakdown will have a volume reduction of 50% by week 8 Date Initiated: 07/28/2022 Target Resolution Date: 08/14/2022 Goal Status: Active Interventions: Assess patient/caregiver ability to obtain necessary supplies Assess patient/caregiver ability to perform ulcer/skin care regimen upon admission and as needed Assess ulceration(s) every visit Provide education on ulcer and skin care Treatment Activities: QUENT, LINGENFELTER ([id number]) [phone].pdf Page 6 of 9 Skin care regimen initiated : 06/19/2022 Topical wound management initiated : 06/19/2022 Notes: Electronic Signature(s) Signed: 12/17/2022 6:12:13 PM By: [name], BSN, RN, CWS, [name] RN, BSN Signed: 12/18/2022 1:35:31 PM By: Massie Kluver Entered By: Massie Kluver on 12/17/2022 15:38:11 -------------------------------------------------------------------------------- Pain Assessment Details Patient Name: Date of Service: [name], [name] 12/17/2022 1:00 PM Medical  Record Number: [id number] Patient Account Number: [id number] Date of Birth/Sex: Treating RN: January 11, 1946 (77 y.o. [name] Primary Care Ashawna Hanback: Fulton Reek Other Clinician: Massie Kluver Referring Hedwig Mcfall: Treating Altheia Shafran/Extender: [name] Weeks in Treatment: 25 Active Problems Location of Pain Severity and Description of Pain Patient Has Paino No Site Locations Pain Management and Medication Current Pain Management: Electronic Signature(s) Signed: 12/17/2022 6:12:13 PM By: [name], BSN, RN, CWS, [name] RN, BSN Signed: 12/18/2022 1:35:31 PM By: Massie Kluver Entered By: Massie Kluver on 12/17/2022 13:19:41 [name] ([id number] Page 7 of 9 -------------------------------------------------------------------------------- Patient/Caregiver Education Details Patient Name: Date of Service: [name] [name] 2/29/2024andnbsp1:00 PM Medical Record Number: [id number] Patient Account Number: [id number] Date of Birth/Gender: Treating RN: 11-17-1945 (77 y.o. [name] Primary Care Physician: Fulton Reek Other Clinician: Massie Kluver Referring Physician: Treating Physician/Extender: [name] in Treatment: 25 Education Assessment Education Provided To: Patient Education Topics Provided Wound/Skin Impairment: Handouts: Other: continue wound care as directed Methods: Explain/Verbal Responses: State content correctly Electronic Signature(s) Signed: 12/18/2022 1:35:31 PM By: Massie Kluver Entered By: Massie Kluver on 12/17/2022 15:38:32 -------------------------------------------------------------------------------- Wound Assessment Details Patient Name: Date of Service: [name], [name] 12/17/2022 1:00 PM  Medical Record Number: [id number] Patient Account Number: [id number] Date of Birth/Sex: Treating RN: Aug 24, 1946 (77 y.o. [name] Primary Care Melvin Whiteford: Fulton Reek Other Clinician: Massie Kluver Referring Bassel Gaskill: Treating Emiel Kielty/Extender: [name] Weeks in Treatment: 25 Wound Status Wound Number: 10 Primary Diabetic Wound/Ulcer of the Lower Extremity Etiology: Wound Location: Left Calcaneus Secondary Pressure Ulcer Wounding Event: Gradually Appeared Etiology: Date Acquired: 06/05/2022 Wound Open Weeks Of Treatment: 25 Status: Clustered Wound: No Comorbid Cataracts, Arrhythmia, Coronary Artery Disease, Hypertension, History: Type II Diabetes, Osteoarthritis, Neuropathy Photos JACOB, JILANI ([id number]) [phone].pdf Page 8 of 9 Wound Measurements Length: (cm) 1.5 Width: (cm) 3 Depth: (cm) 0.2 Area: (cm) 3.534 Volume: (cm) 0.707 % Reduction in Area: -149.9% % Reduction in Volume: -149.8% Epithelialization: Small (1-33%) Wound Description Classification: Grade 1 Wound Margin: Flat and Intact Exudate Amount: Medium Exudate Type: Serosanguineous Exudate Color: red, brown Foul Odor After Cleansing: No Slough/Fibrino Yes Wound Bed Granulation Amount: Small (1-33%) Exposed Structure Granulation Quality: Pink Fascia Exposed: No Necrotic Amount: Large (67-100%) Fat Layer (Subcutaneous Tissue) Exposed: Yes Necrotic Quality: Adherent Slough Tendon Exposed: No Muscle Exposed: No Joint Exposed: No Bone Exposed: No Treatment Notes Wound #10 (Calcaneus) Wound Laterality: Left Cleanser Peri-Wound Care Topical Gentamicin Discharge Instruction: Apply as directed by Arline Ketter. Primary Dressing Hydrofera Blue Ready Transfer Foam, 2.5x2.5 (in/in) Discharge Instruction: Apply Hydrofera Blue Ready to wound bed as directed Secondary Dressing ABD Pad 5x9 (in/in) Discharge Instruction: Cover with ABD pad Secured With Kerlix Roll Sterile or Non-Sterile 6-ply 4.5x4 (yd/yd) Discharge Instruction: Apply Kerlix as directed Tubigrip Size D, 3x10 (in/yd) Compression Wrap Compression  Stockings Add-Ons Electronic Signature(s) Signed: 12/17/2022 6:12:13 PM By: [name], BSN, RN, CWS, [name] RN, BSN Signed: 12/18/2022 1:35:31 PM By: Massie Kluver Entered By: Massie Kluver on 12/17/2022 13:23:21 [name] ([id number] Page 9 of 9 -------------------------------------------------------------------------------- Vitals Details Patient Name: Date of Service: [name], [name] 12/17/2022 1:00 PM Medical Record Number: [id number] Patient Account Number: [id number] Date of Birth/Sex: Treating RN: 08-24-1946 (77 y.o. [name], [name] Primary Care Javana Schey: Fulton Reek Other Clinician: Massie Kluver Referring Afra Tricarico: Treating Gloriann Riede/Extender: [name] Weeks in Treatment: 25 Vital Signs Time Taken: 13:10 Temperature (F): 98.0 Height (in): 70 Pulse (bpm): 66 Weight (lbs): 265 Respiratory Rate (breaths/min): 18 Body Mass Index (BMI): 38 Blood Pressure (mmHg): 135/40 Reference Range: 80 - 120 mg / dl Electronic Signature(s) Signed: 12/18/2022 1:35:31 PM By: Massie Kluver Entered By: Massie Kluver on 12/17/2022 13:19:36

## 2022-12-21 NOTE — Progress Notes (Signed)
[name], [name] ([id number]) [id number] Page 1 of 12 Visit Report for 12/17/2022 Chief Complaint Document Details Patient Name: Date of Service: [name], [name] [name] 12/17/2022 1:00 PM Medical Record Number: [id number] Patient Account Number: [id number] Date of Birth/Sex: Treating RN: 1946/10/17 (77 y.o. [name] Primary Care Provider: Fulton Reek Other Clinician: Massie Kluver Referring Provider: Treating Provider/Extender: Maurilio Lovely Weeks in Treatment: 25 Information Obtained from: Patient Chief Complaint Left heel ulcer Electronic Signature(s) Signed: 12/17/2022 1:07:12 PM By: Worthy Keeler PA-C Entered By: Worthy Keeler on 12/17/2022 13:07:12 -------------------------------------------------------------------------------- Debridement Details Patient Name: Date of Service: [name], [name] [name] 12/17/2022 1:00 PM Medical Record Number: [id number] Patient Account Number: [id number] Date of Birth/Sex: Treating RN: July 02, 1946 (77 y.o. [name] Primary Care Provider: Fulton Reek Other Clinician: Massie Kluver Referring Provider: Treating Provider/Extender: Maurilio Lovely Weeks in Treatment: 25 Debridement Performed for Assessment: Wound #10 Left Calcaneus Performed By: Physician Tommie Sams., PA-C Debridement Type: Debridement Severity of Tissue Pre Debridement: Fat layer exposed Level of Consciousness (Pre-procedure): Awake and Alert Pre-procedure Verification/Time Out Yes - 13:50 Taken: Start Time: 13:50 T Area Debrided (L x W): otal 1.5 (cm) x 3 (cm) = 4.5 (cm) Tissue and other material debrided: Viable, Non-Viable, Slough, Subcutaneous, Slough Level: Skin/Subcutaneous Tissue Debridement Description: Excisional Instrument: Curette Bleeding: Minimum Hemostasis Achieved: Pressure Response to Treatment: Procedure was tolerated well Level of Consciousness (Post- Awake and  Alert procedure): [name], [name] ([id number]) [id number] Page 2 of 12 Post Debridement Measurements of Total Wound Length: (cm) 1.5 Width: (cm) 3 Depth: (cm) 0.2 Volume: (cm) 0.707 Character of Wound/Ulcer Post Debridement: Stable Severity of Tissue Post Debridement: Fat layer exposed Post Procedure Diagnosis Same as Pre-procedure Electronic Signature(s) Signed: 12/17/2022 5:15:32 PM By: Worthy Keeler PA-C Signed: 12/17/2022 6:12:13 PM By: [name] BSN, RN, CWS, [name] RN, BSN Signed: 12/18/2022 1:35:31 PM By: Massie Kluver Entered By: Massie Kluver on 12/17/2022 13:54:10 -------------------------------------------------------------------------------- HPI Details Patient Name: Date of Service: [name], [name] [name] 12/17/2022 1:00 PM Medical Record Number: [id number] Patient Account Number: [id number] Date of Birth/Sex: Treating RN: 1945/10/22 (77 y.o. [name] Primary Care Provider: Fulton Reek Other Clinician: Massie Kluver Referring Provider: Treating Provider/Extender: Maurilio Lovely Weeks in Treatment: 25 History of Present Illness HPI Description: 09/24/2020 on evaluation today patient presents today for a heel ulcer that he tells me has been present for about 2 years. He has been seeing podiatry and they have been attempting to manage this including what sounds to be a total contact cast, Unna boot, and just standard dressings otherwise as well. Most recently has been using triple antibiotic ointment. With that being said unfortunately despite everything he really has not had any significant improvement. He tells me that he cannot even really remember exactly how this began but he presumed it may have rubbed on his shoes or something of that nature. 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 He tells me that this does seem to help. Fortunately there is no signs of active infection at this time. His most recent hemoglobin A1c was 8.1 though he knows this was this year he cannot tell me the exact time. His  fluid pills currently to help with some of the lower extremity edema although he does obviously have signs of venous stasis/lymphedema. Currently there is no evidence of active infection. No fevers, chills, nausea, vomiting, or diarrhea. Patient has had fairly recent ABIs which were performed on 07/19/2020 and revealed that he has normal findings in both the ankle and toe locations bilaterally. His ABI on the right was 1.09 on the left was 1.08 with a TBI on the right of 0.88 and on the left of 0.94. Triphasic flow was noted throughout. 10/08/2020 on evaluation today patient appears to be doing pretty well in regard to his left heel currently in fact this is doing a great job and seems to be healing quite nicely. Unfortunately on his right leg he had a pile of wood that actually fell on him injuring his right leg this is somewhat erythematous has me concerned little bit about cellulitis though there is not really a good area to culture at this point. 10/24/2020 upon evaluation today patient appears to be doing well with regard to his heel ulcer. He is showing signs of improvement which is great news. His right leg is completely healed. Overall I feel like he is doing excellent and there is no signs of infection. 11/07/2020 upon evaluation today patient appears to be doing well with regard to his heel ulcer. He tells me that last week when he was unable to come in his wife actually thought that the wound was very close to closing if not closed. Then it began to "reopen again". I really feel like what may have happened as the collagen may have dried  over the wound bed and that because that misunderstanding with thinking that the wound was healing. With that being said I did not see it last week I do not know that for certain. Either way I feel like he is doing great today I see no signs of infection at this point. 11/26/2020 upon inspection today patient appears to be doing decently well in regard to his heel ulcer. He has been tolerating the dressing changes without complication. Fortunately there is no sign of active infection at this time. No fevers, chills, nausea, vomiting, or diarrhea. 12/10/2020 upon evaluation today patient appears to be doing fairly well in regard to the wound on his heel as well as what appears to be a new wound of the left first metatarsal head plantar aspect. This seems to be an area that was callus that has split as the patient tells me has been trying to walk on his toes more has probably where this came from. With that being said there does not appear to be signs of active infection which is great news. 12/17/2020 upon evaluation today patient actually appears to be making good progress currently. Fortunately there is no evidence of active infection at this time. Overall I feel like he is very close to complete closure. 12/26/2020 upon evaluation today patient appears to be doing excellent in regard to his wounds. In fact I am not certain that these are not even completely healed on initial inspection. Overall I am very pleased with where things stand today. Good news is that they are healed he is actually get ready to go out of town and that will be helpful as well as he will be a full part of the time. [name], [name] ([id number]) [id number] Page 3 of 12 Readmission: 02/04/2021 upon evaluation today patient appears to be doing well at this point in  regard to his left heel that I previously saw him for. Unfortunately he is having issues with his right lower extremity. He has significant  wounds at this point he also has erythema noted there is definite signs of cellulitis which is unfortunate. With that being said I think we do need to address this sooner rather than later. The good news is he did have a nice trip to the beach. He tells me that he had no issues during that time. 4/27; patient on Bactrim. Culture showed methicillin sensitive staph aureus therefore the Bactrim should be effective. 2 small areas on the right leg are healed the area on the mid aspect of the tibia almost 100% covered in a very adherent necrotic debris. We have been using silver alginate 02/20/2021 upon evaluation today patient appears to be doing well with regard to his wound. He is showing signs of improvement which is great news overall very pleased with where things stand today. No fevers, chills, nausea, vomiting, or diarrhea. 02/27/2021 upon evaluation today patient appears to be doing well with regard to his leg ulcer. He is tolerating the dressing changes and overall appears to be doing quite excellent. I am extremely pleased with where things stand and overall I think patient is making great progress. There is no sign of active infection at this time which is also great news. 03/06/2021 upon evaluation today patient appears to be doing excellent in regard to his wounds. In fact he appears to be completely healed today based on what I am seeing. This is excellent news and overall I am extremely pleased with where he stands. Overall the patient is happy to hear this as well this has been quite sometime coming. Readmission: 04/01/2021 patient unfortunately returns for readmission today. He tells me that he has been wearing his compression socks on the right leg daily and he does not really know what is going on and why his legs are doing what they are doing. We did therefore go ahead and probe deeper into exactly what has been going on with him today. Subsequently the patient tells me that when he gets up  and what we would call "first thing in the morning" are really 2 different things". He does not tend to sleep well so he tells me that he will often wake up at 3:00 in the morning. He will then potentially going into the living room to get in his chair where he may read a book for a little while and then potentially fall asleep back in his chair. He then subsequently wake up around 5:00 or so and then get up and in his words "putter around". This often will end with him proceeding at some point around 8 AM or 9 AM to putting on his compression socks. With that being said this means that anywhere from the 3:00 in the morning till roughly around 9:00 in the morning he has no compression on yet he is sitting in his recliner, walking around and up and about, and this is at least 5 to 6 hours of noncompressed time. That may be our issue here but I did not realize until we discussed this further today. Fortunately there does not appear to be any signs of active infection at this time which is great news. With that being said he has multiple wounds of the bilateral lower extremities. 04/10/2021 upon evaluation today patient appears to be doing well with regard to his legs for the most part. He does look  like he had some injury where his wrap slid down nonetheless he did some "doctoring on them". I do feel like most of the areas honestly have cleared back up which is great news. There does not appear to be any signs of active infection at this time which is also great news. No fevers, chills, nausea, vomiting, or diarrhea. 04/18/2021 upon evaluation today patient appears to be doing well with regard to his wounds in fact he appears to be completely healed which is great news. Fortunately there does not appear to be any signs of active infection which is great news. No fevers, chills, nausea, vomiting, or diarrhea. READMISSION 07/28/2021 This is a now 77 year old man we have had in this clinic for quite a bit of  this year. 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 He wears compression stockings. 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 He comes in an old running shoes although he says he wears better shoes at home. His history is that he noticed a blister in the right posterior heel. This open. He has been applying Neosporin to it currently the wound measures 2 x 1.4 cm. 100% slough covered. Not really offloading this in any rigorous way. Past medical history is essentially unchanged he has paroxysmal atrial fibrillation on Coumadin type 2 diabetes with a recent hemoglobin A1c of 7 on metformin. ABI in our clinic was 0.98 on the right 08/05/2021 upon evaluation today patient appears to be doing well with regard to his heel ulcer. Fortunately there does not appear to be any signs of active infection at this time. Overall I been very pleased with where things seem to be currently as far as the wound healing is concerned. Again this is first a lot of seeing him Dr. Dellia Nims readmitted him last week. Nonetheless I think we are definitely making some progress here. 08/11/2021 upon evaluation today patient's wound on the heel actually showing signs of excellent improvement. I am actually very pleased with where things stand currently. No fevers, chills, nausea, vomiting, or diarrhea. There does not appear to be any need for sharp debridement today either which is also great news. 08/25/2021 upon evaluation today patient appears to be doing well with regard to his heel ulcer. This is actually looking significantly improved compared to last time I saw him. Fortunately there does not appear to be any evidence of active infection at this time. No fevers, chills, nausea, vomiting, or diarrhea. 09/01/2021 upon evaluation today patient appears to be doing decently well in regard to his wounds. Fortunately there  does not appear to be any signs of active infection at this time which is great news and overall very pleased in that regard. I do not see any evidence of active infection systemically which is great news as well. No fevers, chills, nausea, vomiting, or diarrhea. I think that the heel is making excellent progress. 09/15/2021 upon evaluation today patient's heel unfortunately is significantly worse compared to what it was previous. I do believe that at this time he would benefit from switching back to something a little bit more offloading from the cushion shoe he has right now this is more of a heel protector what I really need is a Prevalon offloading boot which I think is good to do a lot better for him than just the small heel protector. 09/23/2021 upon evaluation today patient appears to be doing a little better in  regards to last week's visit. Overall I think that he is making good progress which is great news and there does not appear to be any evidence of active infection at this time. No fevers, chills, nausea, vomiting, or diarrhea. 09/30/2021 upon evaluation patient's heel is actually showing signs of good improvement which is great news. Fortunately there does not appear to be any evidence of active infection locally nor systemically at this point. No fevers, chills, nausea, vomiting, or diarrhea. 10/07/2021 upon evaluation today patient appears to be doing well currently in regard to his heel ulcer. He has been tolerating the dressing changes without complication. Fortunately I do not see any signs of active infection at this time which is great news. No fevers, chills, nausea, vomiting, or diarrhea. 12/27; wound is measuring smaller. We have been using silver alginate 10/28/2021 upon evaluation today patient appears to be doing well currently in regard to his heel ulcer. I am actually very pleased with where things stand and I think he is making excellent progress. Fortunately I do not see  any signs of active infection locally nor systemically at this point which is great news. Nonetheless I do believe that the patient would benefit from a new offloading shoe and has been using it Velcro is no longer functioning properly and he tells me that he almost tripped and fell because of that today. Obviously I do not want him following that would be very bad. 11/11/2021 upon evaluation today patient appears to be doing excellent in regard to his wound. In fact this appears to be completely healed based on what I see [name], [name] ([id number]) [phone].pdf Page 4 of 12 currently. I do not see any signs of anything open or draining and I did double check just by clearing away some of the callus around the edges of the wound to ensure that there was nothing still open or hiding underneath. Once this was cleared away it appeared that the patient was doing significantly better at this time which is great news and there was nothing actually open. Readmission: 06-19-2022 upon evaluation today patient appears to be doing well currently in regard to his heel ulcer all things considered. He unfortunately is having a bit of breakdown in general as far as the heel is concerned not nearly as bad as what we noted last time he was here in the clinic. The good news is I do think that this should hopefully heal much more rapidly than what we noted last time. His past medical history has not changed he is on Coumadin. 07-03-2022 upon evaluation today patient appears to be doing better in regard to his heel. We will start to see some improvement here which is good news. Fortunately I do not see any evidence of infection locally or systemically at this time which is great news. No fevers, chills, nausea, vomiting, or diarrhea. 07-10-2022 upon evaluation today patient appears to be doing well currently in regard to his wound from the callus standpoint around the edges. Unfortunately from an  actual wound bed standpoint he has some deep tissue injury noted at this point. Again I am not sure exactly what is going on that is causing this pressure to the region but he is definitely gotten pressure that is occurring and causing this to not heal as effectively as what we would like to see. I discussed with him today that he may need to at night when he sleeping use a pillow up underneath his calf in order  to prevent the heel from touching the bed at all. I also think that it may be beneficial for him to monitor throughout his day and make sure there is no other time when he is getting the pressure to the heel. 07-23-2022 upon evaluation today patient appears to be doing poorly currently in regard to his heel ulcer. He has been tolerating the dressing changes without complication. Unfortunately I feel like he may be infected based on what I am seeing. 07-28-2022 I did review patient's culture results which showed positive for Proteus as well as Staphylococcus both of which would be treated with the Bactrim DS that I gave him at the last visit. This is good news and hopefully means that we should be able to apply the cast today as long as the wound itself does not appear to be doing poorly. Patient's wound bed actually showed signs of doing quite well and I am very pleased with where things stand and I do not see any signs of worsening overall which is great news. 08-04-2022 upon evaluation today patient appears to be doing well currently in regard to his heel ulcer. I am actually very pleased with where things stand and I do think this is looking significantly better. With regard to his right shin that is also showing signs of improvement which is great news. 08-10-2022 upon evaluation today patient's wound on the heel actually appears to be doing significantly better. In regard to the right anterior shin this is showing signs of healing it might even be completely healed but I still get a monitor  I cannot get anything to fill away but also could not see where there was anything actually draining at this point. I am tending to think healed but I want a monitor 1 more week before I call it for sure. 08-17-2022 upon evaluation today patient appears to be doing well currently in regard to his heel. Fortunately he is tolerating the dressing changes without complication. I do not see any signs of infection and overall I think he is making good progress here. We did get approval for the Apligraf but I think he had a $295 co-pay that would have to be paid per application. With that being said as good as he is doing right now I do not think that is necessary but is still an option depending on how things progress if we need to speed this up quite a bit. 08/24/2022; this patient has a wound on his left heel in the setting of type 2 diabetes. We have been using Prisma. He has a heel offloading boot 08-31-2022 upon evaluation today patient appears to be doing poorly in regard to his heel ulcer which is still showing signs of bruising I am just not as happy as I was when I saw him 2 weeks ago with this. He saw Dr. Dellia Nims last week. Also did not look good at that point apparently. Nonetheless I think that the patient is still continuing to have some counterpressure getting to the wound bed unfortunately. 09-07-2022 upon evaluation today patient appears to be doing well currently in regard to his heel ulcer. He has been tolerating the dressing changes without complication. Fortunately there does not appear to be any signs of active infection locally or systemically at this time. Fortunately I do not see any evidence of active infection at this time. 09-15-2022 upon evaluation today patient appears to be doing well currently in regard to his legs which in fact appear to  be completely healed this is great news. With that being said his heel does have some erythema and warmth as well as drainage I am concerned  about cellulitis at this location. 09-21-2022 upon evaluation today patient's wound is actually showing signs of being a little bit smaller but still we are not doing nearly as well as what I would like to see. Fortunately I do not see any signs of infection at this time which is great news and overall I am extremely pleased in that regard. With that being said I do think that he needs to continue to elevate his leg although the edema is under great control with a compression wrap I think switching to a different dressing topically may be beneficial. My suggestion is to switch him over to a Iodoflex dressing. 09-28-2022 upon evaluation today patient appears to be doing well with regard to his heel ulcer. This is actually showing signs of improvement which is great news and overall I am extremely pleased with where we stand today. 10-06-2022 upon evaluation today patient actually appears to be making excellent progress at this point. Fortunately there does not appear to be any signs of active infection locally nor systemically which is great news and overall I am extremely pleased with where we stand. I do feel like he is actually making some pretty good progress here as we switch to the wrap along with the Iodoflex. 12/26; patient has a wound on the tip of his left heel. This is not a weightbearing surface. He is using a heel offloading boot and carefully offloading this at other times during the day. He is using Iodoflex and Zetuvitunder 3 layer compression 10-22-2022 upon evaluation today patient appears to be doing well currently in regard to his wound. He has been tolerating the dressing changes without complication. Fortunately there does not appear to be any signs of active infection locally nor systemically at this time. No fevers, chills, nausea, vomiting, or diarrhea. 08-30-2023 upon evaluation today patient appears to be doing well currently in regard to his heel ulcer which is showing signs of  good improvement. Fortunately I do not see any evidence of infection locally nor systemically which is great news and overall I am extremely pleased with where we stand. No fevers, chills, nausea, vomiting, or diarrhea. Unfortunately he does have a wound on the side of his fifth toe where I think this did rub on the shoe. 11-05-2022 upon evaluation today patient still still showing some signs of pressure at this point and there may have even been some fluid collection at this time. Fortunately I do not see any evidence of active infection locally nor systemically which is great news and overall I am extremely pleased with where we stand. No fevers, chills, nausea, vomiting, or diarrhea. 1/25; the patient's area on the dorsal aspect of the left fifth toe is healed. We have been using Iodoflex to the area on the left heel. The lateral wound is slightly down in length. 11-19-2022 upon evaluation today patient appears to be doing well currently in regard to his wound. He has been tolerating the dressing changes without complication. Fortunately there does not appear to be any signs of infection we been using Iodoflex up to this point. 11-26-2022 upon evaluation today patient appears to be doing poorly currently in regard to his wound. He has been tolerating the dressing changes with the Iodoflex without complication. With that being said he does not have any signs of active infection systemically though  locally I do feel like there is some evidence of infection here. 12-03-2022 upon evaluation today patient appears to be doing well currently in regard to his wound this is better with the Bactrim compared to last week still there was some significant slough and biofilm buildup which is going require sharp debridement at this point. Fortunately I do not see any signs of active infection locally nor systemically which is great news. [name], [name] ([id number]) [id number] Page 5 of  12 12-11-2022 upon evaluation today patient actually appears to be doing significantly better at this time in regard to his heel. I am actually very pleased with where things stand currently. There is no signs of active infection locally or systemically at this point. 12-17-2022 upon evaluation today patient appears to be doing better as far as the overall appearance of his wound. I am actually very pleased in that regard. Fortunately there does not appear to be any signs of active infection locally nor systemically at this time. I do feel like that the patient is showing signs of excellent improvement in general. Nonetheless he needs something better for compression I think he should go ahead and see about getting compression socks. Electronic Signature(s) Signed: 12/17/2022 4:04:23 PM By: Worthy Keeler PA-C Entered By: Worthy Keeler on 12/17/2022 16:04:23 -------------------------------------------------------------------------------- Physical Exam Details Patient Name: Date of Service: [name], [name] [name] 12/17/2022 1:00 PM Medical Record Number: [id number] Patient Account Number: [id number] Date of Birth/Sex: Treating RN: 26-Jul-1946 (77 y.o. [name] Primary Care Provider: Fulton Reek Other Clinician: Massie Kluver Referring Provider: Treating Provider/Extender: Maurilio Lovely Weeks in Treatment: 66 Constitutional Well-nourished and well-hydrated in no acute distress. Respiratory normal breathing without difficulty. Psychiatric this patient is able to make decisions and demonstrates good insight into disease process. Alert and Oriented x 3. pleasant and cooperative. Notes Upon inspection patient's wound bed actually did require sharp debridement clearway some of the necrotic debris he tolerated that today without complication and postdebridement wound bed actually appears to be doing much better which is great news. Electronic Signature(s) Signed: 12/17/2022  4:04:38 PM By: Worthy Keeler PA-C Entered By: Worthy Keeler on 12/17/2022 16:04:38 -------------------------------------------------------------------------------- Physician Orders Details Patient Name: Date of Service: [name], [name] [name] 12/17/2022 1:00 PM Medical Record Number: [id number] Patient Account Number: [id number] Date of Birth/Sex: Treating RN: 24-Feb-1946 (77 y.o. [name] Primary Care Provider: Fulton Reek Other Clinician: Massie Kluver Referring Provider: Treating Provider/Extender: [name] in Treatment: [address] 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 Bathing/ Shower/ Hygiene May shower; gently cleanse wound with antibacterial soap, rinse and pat dry prior to dressing wounds Non-Wound Condition dditional non-wound orders/instructions: - Apply 40% Urea cream to dry cracked skin on feet. A Off-Loading Open toe surgical shoe with peg assist. Wound Treatment Wound #10 - Calcaneus Wound Laterality: Left Topical: Gentamicin 3 x Per Week/30 Days Discharge Instructions: Apply as directed by provider. Prim Dressing: Hydrofera Blue Ready Transfer Foam, 2.5x2.5 (in/in) (Dispense As Written) 3 x Per Week/30 Days ary Discharge Instructions: Apply Hydrofera Blue Ready to wound bed as directed Secondary Dressing: ABD Pad 5x9 (in/in) (Generic) 3 x Per Week/30  Days Discharge Instructions: Cover with ABD pad Secured With: Kerlix Roll Sterile or Non-Sterile 6-ply 4.5x4 (yd/yd) 3 x Per Week/30 Days Discharge Instructions: Apply Kerlix as directed Secured With:  Tubigrip Size D, 3x10 (in/yd) 3 x Per Week/30 Days Electronic Signature(s) Signed: 12/17/2022 5:15:32 PM By: Worthy Keeler PA-C Signed: 12/18/2022 1:35:31 PM By: Massie Kluver Entered By: Massie Kluver on 12/17/2022 13:58:02 -------------------------------------------------------------------------------- Problem List Details Patient Name: Date of Service: [name], [name] [name] 12/17/2022 1:00 PM Medical Record Number: [id number] Patient Account Number: [id number] Date of Birth/Sex: Treating RN: 02-22-1946 (77 y.o. [name] Primary Care Provider: Fulton Reek Other Clinician: Massie Kluver Referring Provider: Treating Provider/Extender: [name] in Treatment: 25 Active Problems ICD-10 Encounter Code Description Active Date MDM Diagnosis [name], [name] ([id number]) [phone].pdf Page 7 of 12 E11.621 Type 2 diabetes mellitus with foot ulcer 06/19/2022 No Yes 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sed 06/19/2022 No Yes E11.42 Type 2 diabetes mellitus with diabetic polyneuropathy 06/19/2022 No Yes Inactive Problems Resolved Problems Electronic Signature(s) Signed: 12/17/2022 1:07:05 PM By: Worthy Keeler PA-C Entered By: Worthy Keeler on 12/17/2022 13:07:04 -------------------------------------------------------------------------------- Progress Note Details Patient Name: Date of Service: [name], [name] [name] 12/17/2022 1:00 PM Medical Record Number: [id number] Patient Account Number: [id number] Date of Birth/Sex: Treating RN: 1946/05/08 (77 y.o. [name] Primary Care Provider: Fulton Reek Other Clinician: Massie Kluver Referring Provider: Treating Provider/Extender: Maurilio Lovely Weeks in Treatment: 25 Subjective Chief Complaint Information obtained from Patient Left heel ulcer History of Present Illness (HPI) 09/24/2020 on evaluation today patient  presents today for a heel ulcer that he tells me has been present for about 2 years. He has been seeing podiatry and they have been attempting to manage this including what sounds to be a total contact cast, Unna boot, and just standard dressings otherwise as well. Most recently has been using triple antibiotic ointment. With that being said unfortunately despite everything he really has not had any significant improvement. He tells me that he cannot even really remember exactly how this began but he presumed it may have rubbed on his shoes or something of that nature. 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 He tells me that this does seem to help. Fortunately there is no signs of active infection at this time. His most recent hemoglobin A1c was 8.1 though he knows this was this year he cannot tell me the exact time. His fluid pills currently to help with some of the lower extremity edema although he does obviously have signs of venous stasis/lymphedema. Currently there is no evidence of active infection. No fevers, chills, nausea, vomiting, or diarrhea. Patient has had fairly recent ABIs which were performed on 07/19/2020 and revealed that he has normal findings in both the ankle and toe locations bilaterally. His ABI on the right was 1.09 on the left was 1.08 with a TBI on the right of 0.88 and on the left of 0.94. Triphasic flow was noted throughout. 10/08/2020 on evaluation today patient appears to be doing pretty well in regard to his left heel currently in fact this is doing a great job and seems  to be healing quite nicely. Unfortunately on his right leg he had a pile of wood that actually fell on him injuring his  right leg this is somewhat erythematous has me concerned little bit about cellulitis though there is not really a good area to culture at this point. 10/24/2020 upon evaluation today patient appears to be doing well with regard to his heel ulcer. He is showing signs of improvement which is great news. His right leg is completely healed. Overall I feel like he is doing excellent and there is no signs of infection. 11/07/2020 upon evaluation today patient appears to be doing well with regard to his heel ulcer. He tells me that last week when he was unable to come in his wife actually thought that the wound was very close to closing if not closed. Then it began to "reopen again". I really feel like what may have happened as the collagen may have dried over the wound bed and that because that misunderstanding with thinking that the wound was healing. With that being said I did not see it last week I do not know that for certain. Either way I feel like he is doing great today I see no signs of infection at this point. 11/26/2020 upon inspection today patient appears to be doing decently well in regard to his heel ulcer. He has been tolerating the dressing changes without complication. Fortunately there is no sign of active infection at this time. No fevers, chills, nausea, vomiting, or diarrhea. 12/10/2020 upon evaluation today patient appears to be doing fairly well in regard to the wound on his heel as well as what appears to be a new wound of the left first metatarsal head plantar aspect. This seems to be an area that was callus that has split as the patient tells me has been trying to walk on his toes more [name], [name] ([id number]) [phone].pdf Page 8 of 12 has probably where this came from. With that being said there does not appear to be signs of active infection which is great news. 12/17/2020 upon evaluation today patient actually appears to be making good progress currently.  Fortunately there is no evidence of active infection at this time. Overall I feel like he is very close to complete closure. 12/26/2020 upon evaluation today patient appears to be doing excellent in regard to his wounds. In fact I am not certain that these are not even completely healed on initial inspection. Overall I am very pleased with where things stand today. Good news is that they are healed he is actually get ready to go out of town and that will be helpful as well as he will be a full part of the time. Readmission: 02/04/2021 upon evaluation today patient appears to be doing well at this point in regard to his left heel that I previously saw him for. Unfortunately he is having issues with his right lower extremity. He has significant wounds at this point he also has erythema noted there is definite signs of cellulitis which is unfortunate. With that being said I think we do need to address this sooner rather than later. The good news is he did have a nice trip to the beach. He tells me that he had no issues during that time. 4/27; patient on Bactrim. Culture showed methicillin sensitive staph aureus therefore the Bactrim should be effective. 2 small areas on the right leg are healed the area on the mid aspect of the tibia almost 100% covered in  a very adherent necrotic debris. We have been using silver alginate 02/20/2021 upon evaluation today patient appears to be doing well with regard to his wound. He is showing signs of improvement which is great news overall very pleased with where things stand today. No fevers, chills, nausea, vomiting, or diarrhea. 02/27/2021 upon evaluation today patient appears to be doing well with regard to his leg ulcer. He is tolerating the dressing changes and overall appears to be doing quite excellent. I am extremely pleased with where things stand and overall I think patient is making great progress. There is no sign of active infection at this time which is also  great news. 03/06/2021 upon evaluation today patient appears to be doing excellent in regard to his wounds. In fact he appears to be completely healed today based on what I am seeing. This is excellent news and overall I am extremely pleased with where he stands. Overall the patient is happy to hear this as well this has been quite sometime coming. Readmission: 04/01/2021 patient unfortunately returns for readmission today. He tells me that he has been wearing his compression socks on the right leg daily and he does not really know what is going on and why his legs are doing what they are doing. We did therefore go ahead and probe deeper into exactly what has been going on with him today. Subsequently the patient tells me that when he gets up and what we would call "first thing in the morning" are really 2 different things". He does not tend to sleep well so he tells me that he will often wake up at 3:00 in the morning. He will then potentially going into the living room to get in his chair where he may read a book for a little while and then potentially fall asleep back in his chair. He then subsequently wake up around 5:00 or so and then get up and in his words "putter around". This often will end with him proceeding at some point around 8 AM or 9 AM to putting on his compression socks. With that being said this means that anywhere from the 3:00 in the morning till roughly around 9:00 in the morning he has no compression on yet he is sitting in his recliner, walking around and up and about, and this is at least 5 to 6 hours of noncompressed time. That may be our issue here but I did not realize until we discussed this further today. Fortunately there does not appear to be any signs of active infection at this time which is great news. With that being said he has multiple wounds of the bilateral lower extremities. 04/10/2021 upon evaluation today patient appears to be doing well with regard to his legs  for the most part. He does look like he had some injury where his wrap slid down nonetheless he did some "doctoring on them". I do feel like most of the areas honestly have cleared back up which is great news. There does not appear to be any signs of active infection at this time which is also great news. No fevers, chills, nausea, vomiting, or diarrhea. 04/18/2021 upon evaluation today patient appears to be doing well with regard to his wounds in fact he appears to be completely healed which is great news. Fortunately there does not appear to be any signs of active infection which is great news. No fevers, chills, nausea, vomiting, or diarrhea. READMISSION 07/28/2021 This is a now 77 year old man we have  had in this clinic for quite a bit of this year. 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 He wears compression stockings. 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 He comes in an old running shoes although he says he wears better shoes at home. His history is that he noticed a blister in the right posterior heel. This open. He has been applying Neosporin to it currently the wound measures 2 x 1.4 cm. 100% slough covered. Not really offloading this in any rigorous way. Past medical history is essentially unchanged he has paroxysmal atrial fibrillation on Coumadin type 2 diabetes with a recent hemoglobin A1c of 7 on metformin. ABI in our clinic was 0.98 on the right 08/05/2021 upon evaluation today patient appears to be doing well with regard to his heel ulcer. Fortunately there does not appear to be any signs of active infection at this time. Overall I been very pleased with where things seem to be currently as far as the wound healing is concerned. Again this is first a lot of seeing him Dr. Dellia Nims readmitted him last week. Nonetheless I think we are definitely making some progress  here. 08/11/2021 upon evaluation today patient's wound on the heel actually showing signs of excellent improvement. I am actually very pleased with where things stand currently. No fevers, chills, nausea, vomiting, or diarrhea. There does not appear to be any need for sharp debridement today either which is also great news. 08/25/2021 upon evaluation today patient appears to be doing well with regard to his heel ulcer. This is actually looking significantly improved compared to last time I saw him. Fortunately there does not appear to be any evidence of active infection at this time. No fevers, chills, nausea, vomiting, or diarrhea. 09/01/2021 upon evaluation today patient appears to be doing decently well in regard to his wounds. Fortunately there does not appear to be any signs of active infection at this time which is great news and overall very pleased in that regard. I do not see any evidence of active infection systemically which is great news as well. No fevers, chills, nausea, vomiting, or diarrhea. I think that the heel is making excellent progress. 09/15/2021 upon evaluation today patient's heel unfortunately is significantly worse compared to what it was previous. I do believe that at this time he would benefit from switching back to something a little bit more offloading from the cushion shoe he has right now this is more of a heel protector what I really need is a Prevalon offloading boot which I think is good to do a lot better for him than just the small heel protector. 09/23/2021 upon evaluation today patient appears to be doing a little better in regards to last week's visit. Overall I think that he is making good progress which is great news and there does not appear to be any evidence of active infection at this time. No fevers, chills, nausea, vomiting, or diarrhea. 09/30/2021 upon evaluation patient's heel is actually showing signs of good improvement which is great news. Fortunately  there does not appear to be any evidence of active infection locally nor systemically at this point. No fevers, chills, nausea, vomiting, or diarrhea. 10/07/2021 upon evaluation today patient appears to be doing well currently in regard to his heel ulcer. He has been tolerating the dressing changes without complication. Fortunately I do not see any signs of active infection at this time  which is great news. No fevers, chills, nausea, vomiting, or diarrhea. [name], [name] ([id number]) [id number] Page 9 of 12 12/27; wound is measuring smaller. We have been using silver alginate 10/28/2021 upon evaluation today patient appears to be doing well currently in regard to his heel ulcer. I am actually very pleased with where things stand and I think he is making excellent progress. Fortunately I do not see any signs of active infection locally nor systemically at this point which is great news. Nonetheless I do believe that the patient would benefit from a new offloading shoe and has been using it Velcro is no longer functioning properly and he tells me that he almost tripped and fell because of that today. Obviously I do not want him following that would be very bad. 11/11/2021 upon evaluation today patient appears to be doing excellent in regard to his wound. In fact this appears to be completely healed based on what I see currently. I do not see any signs of anything open or draining and I did double check just by clearing away some of the callus around the edges of the wound to ensure that there was nothing still open or hiding underneath. Once this was cleared away it appeared that the patient was doing significantly better at this time which is great news and there was nothing actually open. Readmission: 06-19-2022 upon evaluation today patient appears to be doing well currently in regard to his heel ulcer all things considered. He unfortunately is having a bit of breakdown in  general as far as the heel is concerned not nearly as bad as what we noted last time he was here in the clinic. The good news is I do think that this should hopefully heal much more rapidly than what we noted last time. His past medical history has not changed he is on Coumadin. 07-03-2022 upon evaluation today patient appears to be doing better in regard to his heel. We will start to see some improvement here which is good news. Fortunately I do not see any evidence of infection locally or systemically at this time which is great news. No fevers, chills, nausea, vomiting, or diarrhea. 07-10-2022 upon evaluation today patient appears to be doing well currently in regard to his wound from the callus standpoint around the edges. Unfortunately from an actual wound bed standpoint he has some deep tissue injury noted at this point. Again I am not sure exactly what is going on that is causing this pressure to the region but he is definitely gotten pressure that is occurring and causing this to not heal as effectively as what we would like to see. I discussed with him today that he may need to at night when he sleeping use a pillow up underneath his calf in order to prevent the heel from touching the bed at all. I also think that it may be beneficial for him to monitor throughout his day and make sure there is no other time when he is getting the pressure to the heel. 07-23-2022 upon evaluation today patient appears to be doing poorly currently in regard to his heel ulcer. He has been tolerating the dressing changes without complication. Unfortunately I feel like he may be infected based on what I am seeing. 07-28-2022 I did review patient's culture results which showed positive for Proteus as well as Staphylococcus both of which would be treated with the Bactrim DS that I gave him at the last visit. This is good news and  hopefully means that we should be able to apply the cast today as long as the wound itself  does not appear to be doing poorly. Patient's wound bed actually showed signs of doing quite well and I am very pleased with where things stand and I do not see any signs of worsening overall which is great news. 08-04-2022 upon evaluation today patient appears to be doing well currently in regard to his heel ulcer. I am actually very pleased with where things stand and I do think this is looking significantly better. With regard to his right shin that is also showing signs of improvement which is great news. 08-10-2022 upon evaluation today patient's wound on the heel actually appears to be doing significantly better. In regard to the right anterior shin this is showing signs of healing it might even be completely healed but I still get a monitor I cannot get anything to fill away but also could not see where there was anything actually draining at this point. I am tending to think healed but I want a monitor 1 more week before I call it for sure. 08-17-2022 upon evaluation today patient appears to be doing well currently in regard to his heel. Fortunately he is tolerating the dressing changes without complication. I do not see any signs of infection and overall I think he is making good progress here. We did get approval for the Apligraf but I think he had a $295 co-pay that would have to be paid per application. With that being said as good as he is doing right now I do not think that is necessary but is still an option depending on how things progress if we need to speed this up quite a bit. 08/24/2022; this patient has a wound on his left heel in the setting of type 2 diabetes. We have been using Prisma. He has a heel offloading boot 08-31-2022 upon evaluation today patient appears to be doing poorly in regard to his heel ulcer which is still showing signs of bruising I am just not as happy as I was when I saw him 2 weeks ago with this. He saw Dr. Dellia Nims last week. Also did not look good at that  point apparently. Nonetheless I think that the patient is still continuing to have some counterpressure getting to the wound bed unfortunately. 09-07-2022 upon evaluation today patient appears to be doing well currently in regard to his heel ulcer. He has been tolerating the dressing changes without complication. Fortunately there does not appear to be any signs of active infection locally or systemically at this time. Fortunately I do not see any evidence of active infection at this time. 09-15-2022 upon evaluation today patient appears to be doing well currently in regard to his legs which in fact appear to be completely healed this is great news. With that being said his heel does have some erythema and warmth as well as drainage I am concerned about cellulitis at this location. 09-21-2022 upon evaluation today patient's wound is actually showing signs of being a little bit smaller but still we are not doing nearly as well as what I would like to see. Fortunately I do not see any signs of infection at this time which is great news and overall I am extremely pleased in that regard. With that being said I do think that he needs to continue to elevate his leg although the edema is under great control with a compression wrap I think switching to a  different dressing topically may be beneficial. My suggestion is to switch him over to a Iodoflex dressing. 09-28-2022 upon evaluation today patient appears to be doing well with regard to his heel ulcer. This is actually showing signs of improvement which is great news and overall I am extremely pleased with where we stand today. 10-06-2022 upon evaluation today patient actually appears to be making excellent progress at this point. Fortunately there does not appear to be any signs of active infection locally nor systemically which is great news and overall I am extremely pleased with where we stand. I do feel like he is actually making some pretty good  progress here as we switch to the wrap along with the Iodoflex. 12/26; patient has a wound on the tip of his left heel. This is not a weightbearing surface. He is using a heel offloading boot and carefully offloading this at other times during the day. He is using Iodoflex and Zetuvitunder 3 layer compression 10-22-2022 upon evaluation today patient appears to be doing well currently in regard to his wound. He has been tolerating the dressing changes without complication. Fortunately there does not appear to be any signs of active infection locally nor systemically at this time. No fevers, chills, nausea, vomiting, or diarrhea. 08-30-2023 upon evaluation today patient appears to be doing well currently in regard to his heel ulcer which is showing signs of good improvement. Fortunately I do not see any evidence of infection locally nor systemically which is great news and overall I am extremely pleased with where we stand. No fevers, chills, nausea, vomiting, or diarrhea. Unfortunately he does have a wound on the side of his fifth toe where I think this did rub on the shoe. 11-05-2022 upon evaluation today patient still still showing some signs of pressure at this point and there may have even been some fluid collection at this time. Fortunately I do not see any evidence of active infection locally nor systemically which is great news and overall I am extremely pleased with where we stand. No fevers, chills, nausea, vomiting, or diarrhea. 1/25; the patient's area on the dorsal aspect of the left fifth toe is healed. We have been using Iodoflex to the area on the left heel. The lateral wound is slightly down in length. 11-19-2022 upon evaluation today patient appears to be doing well currently in regard to his wound. He has been tolerating the dressing changes without complication. Fortunately there does not appear to be any signs of infection we been using Iodoflex up to this point. [name], [name]  ([id number]) [id number] Page 10 of 12 11-26-2022 upon evaluation today patient appears to be doing poorly currently in regard to his wound. He has been tolerating the dressing changes with the Iodoflex without complication. With that being said he does not have any signs of active infection systemically though locally I do feel like there is some evidence of infection here. 12-03-2022 upon evaluation today patient appears to be doing well currently in regard to his wound this is better with the Bactrim compared to last week still there was some significant slough and biofilm buildup which is going require sharp debridement at this point. Fortunately I do not see any signs of active infection locally nor systemically which is great news. 12-11-2022 upon evaluation today patient actually appears to be doing significantly better at this time in regard to his heel. I am actually very pleased with where things stand currently. There is no signs of active infection locally  or systemically at this point. 12-17-2022 upon evaluation today patient appears to be doing better as far as the overall appearance of his wound. I am actually very pleased in that regard. Fortunately there does not appear to be any signs of active infection locally nor systemically at this time. I do feel like that the patient is showing signs of excellent improvement in general. Nonetheless he needs something better for compression I think he should go ahead and see about getting compression socks. Objective Constitutional Well-nourished and well-hydrated in no acute distress. Vitals Time Taken: 1:10 PM, Height: 70 in, Weight: 265 lbs, BMI: 38, Temperature: 98.0 F, Pulse: 66 bpm, Respiratory Rate: 18 breaths/min, Blood Pressure: 135/40 mmHg. Respiratory normal breathing without difficulty. Psychiatric this patient is able to make decisions and demonstrates good insight into disease process. Alert and  Oriented x 3. pleasant and cooperative. General Notes: Upon inspection patient's wound bed actually did require sharp debridement clearway some of the necrotic debris he tolerated that today without complication and postdebridement wound bed actually appears to be doing much better which is great news. Integumentary (Hair, Skin) Wound #10 status is Open. Original cause of wound was Gradually Appeared. The date acquired was: 06/05/2022. The wound has been in treatment 25 weeks. The wound is located on the Left Calcaneus. The wound measures 1.5cm length x 3cm width x 0.2cm depth; 3.534cm^2 area and 0.707cm^3 volume. There is Fat Layer (Subcutaneous Tissue) exposed. There is a medium amount of serosanguineous drainage noted. The wound margin is flat and intact. There is small (1-33%) pink granulation within the wound bed. There is a large (67-100%) amount of necrotic tissue within the wound bed including Adherent Slough. 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 There was a Excisional Skin/Subcutaneous Tissue Debridement with a total area of 4.5 sq cm performed by Tommie Sams., PA-C. With the following instrument(s): Curette to remove Viable and Non-Viable tissue/material. Material removed includes Subcutaneous Tissue and Slough and. A time out was conducted at 13:50, prior to the start of the procedure. A Minimum amount of bleeding was controlled with Pressure. The procedure was tolerated well. Post Debridement Measurements: 1.5cm length x 3cm width x 0.2cm depth; 0.707cm^3 volume. Character of Wound/Ulcer Post Debridement is stable. Severity  of Tissue Post Debridement is: Fat layer exposed. Post procedure Diagnosis Wound #10: Same as Pre-Procedure Plan Follow-up Appointments: Wound #10 Left Calcaneus: ULICES, SASSE ([id number]) [phone].pdf Page 11 of 12 Return Appointment in 1 week. Bathing/ Shower/ Hygiene: May shower; gently cleanse wound with antibacterial soap, rinse and pat dry prior to dressing wounds Non-Wound Condition: Additional non-wound orders/instructions: - Apply 40% Urea cream to dry cracked skin on feet. Off-Loading: Open toe surgical shoe with peg assist. WOUND #10: - Calcaneus Wound Laterality: Left Topical: Gentamicin 3 x Per Week/30 Days Discharge Instructions: Apply as directed by provider. Prim Dressing: Hydrofera Blue Ready Transfer Foam, 2.5x2.5 (in/in) (Dispense As Written) 3 x Per Week/30 Days ary Discharge Instructions: Apply Hydrofera Blue Ready to wound bed as directed Secondary Dressing: ABD Pad 5x9 (in/in) (Generic) 3 x Per Week/30 Days Discharge Instructions: Cover with ABD pad Secured With: Kerlix Roll Sterile or Non-Sterile 6-ply 4.5x4 (yd/yd) 3 x  Per Week/30 Days Discharge Instructions: Apply Kerlix as directed Secured With: Tubigrip Size D, 3x10 (in/yd) 3 x Per Week/30 Days 1. I would recommend that we have the patient continue with the wound care measures as before and he is in agreement with plan. This includes the use of the [facility] after applying the gentamicin topically. 2. I am also recommend we continue to cover with an ABD pad followed by the Tubigrip size D until he get his compression socks. We will see patient back for reevaluation in 1 week here in the clinic. If anything worsens or changes patient will contact our office for additional recommendations. Electronic Signature(s) Signed: 12/17/2022 4:06:25 PM By: Worthy Keeler PA-C Entered By: Worthy Keeler on 12/17/2022  16:06:25 -------------------------------------------------------------------------------- SuperBill Details Patient Name: Date of Service: [name], [name] [name] 12/17/2022 Medical Record Number: [id number] Patient Account Number: [id number] Date of Birth/Sex: Treating RN: September 28, 1946 (77 y.o. [name] Primary Care Provider: Fulton Reek Other Clinician: Massie Kluver Referring Provider: Treating Provider/Extender: Maurilio Lovely Weeks in Treatment: 25 Diagnosis Coding ICD-10 Codes Code Description E11.621 Type 2 diabetes mellitus with foot ulcer 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sed E11.42 Type 2 diabetes mellitus with diabetic polyneuropathy Facility Procedures : CPT4 Code: [id number] Description: [id number] - DEB SUBQ TISSUE 20 SQ CM/< ICD-10 Diagnosis Description 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sed Modifier: Quantity: 1 Physician Procedures : CPT4 Code Description Modifier [id number] - WC PHYS SUBQ TISS 20 SQ CM ICD-10 Diagnosis Description 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sed [name], [name] ([id number])  [phone].pdf Page 12 o Quantity: 1 f 12 Electronic Signature(s) Signed: 12/17/2022 4:09:20 PM By: Worthy Keeler PA-C Entered By: Worthy Keeler on 12/17/2022 16:09:20

## 2022-12-24 ENCOUNTER — Encounter: Payer: Medicare HMO | Attending: Internal Medicine | Admitting: Internal Medicine

## 2022-12-24 DIAGNOSIS — G8929 Other chronic pain: Secondary | ICD-10-CM | POA: Insufficient documentation

## 2022-12-24 DIAGNOSIS — L97522 Non-pressure chronic ulcer of other part of left foot with fat layer exposed: Secondary | ICD-10-CM | POA: Diagnosis not present

## 2022-12-24 DIAGNOSIS — Z952 Presence of prosthetic heart valve: Secondary | ICD-10-CM | POA: Insufficient documentation

## 2022-12-24 DIAGNOSIS — I10 Essential (primary) hypertension: Secondary | ICD-10-CM | POA: Diagnosis not present

## 2022-12-24 DIAGNOSIS — M1990 Unspecified osteoarthritis, unspecified site: Secondary | ICD-10-CM | POA: Insufficient documentation

## 2022-12-24 DIAGNOSIS — Z7901 Long term (current) use of anticoagulants: Secondary | ICD-10-CM | POA: Insufficient documentation

## 2022-12-24 DIAGNOSIS — E11621 Type 2 diabetes mellitus with foot ulcer: Secondary | ICD-10-CM | POA: Insufficient documentation

## 2022-12-24 DIAGNOSIS — E1142 Type 2 diabetes mellitus with diabetic polyneuropathy: Secondary | ICD-10-CM | POA: Diagnosis not present

## 2022-12-26 NOTE — Progress Notes (Signed)
HUDSYN, SCHIAPPA ([id number]) [id number] Page 1 of 9 Visit Report for 12/24/2022 Arrival Information Details Patient Name: Date of Service: [name], [name]. 12/24/2022 10:00 A M Medical Record Number: [id number] Patient Account Number: [id number] Date of Birth/Sex: Treating RN: 25-Aug-1946 (77 y.o. [name] Primary Care Nieshia Larmon: Fulton Reek Other Clinician: Massie Kluver Referring Raynie Steinhaus: Treating Desia Saban/Extender: RO BSO N, MICHA EL Ginny Forth in Treatment: 27 Visit Information History Since Last Visit All ordered tests and consults were completed: No Patient Arrived: Ambulatory Added or deleted any medications: No Arrival Time: 10:28 Any new allergies or adverse reactions: No Transfer Assistance: None Had a fall or experienced change in No Patient Identification Verified: Yes activities of daily living that may affect Secondary Verification Process Completed: Yes risk of falls: Patient Requires Transmission-Based Precautions: No Signs or symptoms of abuse/neglect since No Patient Has Alerts: Yes last visito Patient Alerts: Patient on Blood Thinner Hospitalized since last visit: No Warfarin Implantable device outside of the clinic No Type II Diabetic excluding cellular tissue based products placed in the center since last visit: Has Dressing in Place as Prescribed: Yes Has Compression in Place as Prescribed: Yes Has Footwear/Offloading in Place as Yes Prescribed: Left: Surgical Shoe with Pressure Relief Insole Pain Present Now: No Electronic Signature(s) Signed: 12/24/2022 3:52:49 PM By: Massie Kluver Entered By: Massie Kluver on 12/24/2022 10:35:13 -------------------------------------------------------------------------------- Clinic Level of Care Assessment Details Patient Name: Date of Service: [name], [name] [name] 12/24/2022 10:00 A M Medical Record Number: [id number] Patient Account Number: [id number] Date of  Birth/Sex: Treating RN: 16-Mar-1946 (78 y.o. [name] Primary Care [name]: Fulton Reek Other Clinician: Massie Kluver Referring Royalti Schauf: Treating Makinley Muscato/Extender: Eldridge Dace, MICHA EL Ginny Forth in Treatment: [facility] Level of Care Assessment Items SPENCER, DELORENZO ([id number]) [phone].pdf Page 2 of 9 TOOL 4 Quantity Score '[]'$  - 0 Use when only an EandM is performed on FOLLOW-UP visit ASSESSMENTS - Nursing Assessment / Reassessment X- 1 10 Reassessment of Co-morbidities (includes updates in patient status) X- 1 5 Reassessment of Adherence to Treatment Plan ASSESSMENTS - Wound and Skin A ssessment / Reassessment X - Simple Wound Assessment / Reassessment - one wound 1 5 '[]'$  - 0 Complex Wound Assessment / Reassessment - multiple wounds '[]'$  - 0 Dermatologic / Skin Assessment (not related to wound area) ASSESSMENTS - Focused Assessment X- 1 5 Circumferential Edema Measurements - multi extremities '[]'$  - 0 Nutritional Assessment / Counseling / Intervention '[]'$  - 0 Lower Extremity Assessment (monofilament, tuning fork, pulses) '[]'$  - 0 Peripheral Arterial Disease Assessment (using hand held doppler) ASSESSMENTS - Ostomy and/or Continence Assessment and Care '[]'$  - 0 Incontinence Assessment and Management '[]'$  - 0 Ostomy Care Assessment and Management (repouching, etc.) PROCESS - Coordination of Care X - Simple Patient / Family Education for ongoing care 1 15 '[]'$  - 0 Complex (extensive) Patient / Family Education for ongoing care '[]'$  - 0 Staff obtains Programmer, systems, Records, T Results / Process Orders est '[]'$  - 0 Staff telephones HHA, Nursing Homes / Clarify orders / etc '[]'$  - 0 Routine Transfer to another Facility (non-emergent condition) '[]'$  - 0 Routine Hospital Admission (non-emergent condition) '[]'$  - 0 New Admissions / Biomedical engineer / Ordering NPWT Apligraf, etc. , '[]'$  - 0 Emergency Hospital Admission (emergent condition) X- 1  10 Simple Discharge Coordination '[]'$  - 0 Complex (extensive) Discharge Coordination PROCESS - Special Needs '[]'$  - 0 Pediatric / Minor Patient Management '[]'$  - 0 Isolation Patient Management '[]'$  -  0 Hearing / Language / Visual special needs '[]'$  - 0 Assessment of Community assistance (transportation, D/C planning, etc.) '[]'$  - 0 Additional assistance / Altered mentation '[]'$  - 0 Support Surface(s) Assessment (bed, cushion, seat, etc.) INTERVENTIONS - Wound Cleansing / Measurement X - Simple Wound Cleansing - one wound 1 5 '[]'$  - 0 Complex Wound Cleansing - multiple wounds X- 1 5 Wound Imaging (photographs - any number of wounds) '[]'$  - 0 Wound Tracing (instead of photographs) X- 1 5 Simple Wound Measurement - one wound '[]'$  - 0 Complex Wound Measurement - multiple wounds INTERVENTIONS - Wound Dressings '[]'$  - 0 Small Wound Dressing one or multiple wounds X- 1 15 Medium Wound Dressing one or multiple wounds '[]'$  - 0 Large Wound Dressing one or multiple wounds [name], [name] ([id number]) [id number] Page 3 of 9 X- 1 5 Application of Medications - topical '[]'$  - 0 Application of Medications - injection INTERVENTIONS - Miscellaneous '[]'$  - 0 External ear exam '[]'$  - 0 Specimen Collection (cultures, biopsies, blood, body fluids, etc.) '[]'$  - 0 Specimen(s) / Culture(s) sent or taken to Lab for analysis '[]'$  - 0 Patient Transfer (multiple staff / [name] / Similar devices) '[]'$  - 0 Simple Staple / Suture removal (25 or less) '[]'$  - 0 Complex Staple / Suture removal (26 or more) '[]'$  - 0 Hypo / Hyperglycemic Management (close monitor of Blood Glucose) '[]'$  - 0 Ankle / Brachial Index (ABI) - do not check if billed separately X- 1 5 Vital Signs Has the patient been seen at the hospital within the last three years: Yes Total Score: 90 Level Of Care: New/Established - Level 3 Electronic Signature(s) Signed: 12/24/2022 3:52:49 PM By: Massie Kluver Entered By: Massie Kluver  on 12/24/2022 11:02:26 -------------------------------------------------------------------------------- Encounter Discharge Information Details Patient Name: Date of Service: [name], [name] [name] 12/24/2022 10:00 A M Medical Record Number: [id number] Patient Account Number: [id number] Date of Birth/Sex: Treating RN: 1946-07-26 (77 y.o. [name], [name] Primary Care Corena Tilson: Fulton Reek Other Clinician: Massie Kluver Referring Kaleea Penner: Treating Suleiman Finigan/Extender: RO BSO Delane Ginger, MICHA EL Ginny Forth in Treatment: 26 Encounter Discharge Information Items Discharge Condition: Stable Ambulatory Status: Walker Discharge Destination: Home Transportation: Private Auto Accompanied By: self Schedule Follow-up Appointment: Yes Clinical Summary of Care: Electronic Signature(s) Signed: 12/24/2022 3:52:49 PM By: Massie Kluver Entered By: Massie Kluver on 12/24/2022 13:16:31 [name] ([id number]) [id number] Page 4 of 9 -------------------------------------------------------------------------------- Lower Extremity Assessment Details Patient Name: Date of Service: [name], [name] [name] 12/24/2022 10:00 A M Medical Record Number: [id number] Patient Account Number: [id number] Date of Birth/Sex: Treating RN: [birth year] (77 y.o. [name] Primary Care Clarice Bonaventure: Fulton Reek Other Clinician: Massie Kluver Referring Kayleann Mccaffery: Treating Rapheal Masso/Extender: RO BSO N, MICHA EL Ginny Forth in Treatment: 26 Edema Assessment Assessed: [Left: Yes] [Right: Yes] Edema: [Left: Yes] [Right: Yes] Calf Left: Right: Point of Measurement: 33 cm From Medial Instep 40 cm 39.9 cm Ankle Left: Right: Point of Measurement: 11 cm From Medial Instep 23 cm 23.5 cm Knee To Floor Left: Right: From Medial Instep 51 cm 51 cm Electronic Signature(s) Signed: 12/24/2022 3:52:49 PM By: Massie Kluver Signed: 12/25/2022 3:20:50 PM By: [name], BSN, RN, CWS, [name] RN,  BSN Entered By: Massie Kluver on 12/24/2022 10:49:39 -------------------------------------------------------------------------------- Multi Wound Chart Details Patient Name: Date of Service: [name], [name] [name] 12/24/2022 10:00 A M Medical Record Number: [id number] Patient Account Number: [id number] Date of Birth/Sex: Treating RN: 1946-03-23 (77 y.o. [name] Primary Care  Hydia Copelin: Fulton Reek Other Clinician: Massie Kluver Referring Dmarco Baldus: Treating Mannie Wineland/Extender: RO BSO N, MICHA EL Ginny Forth in Treatment: 26 Vital Signs Height(in): 70 Pulse(bpm): 69 Weight(lbs): 265 Blood Pressure(mmHg): 147/74 Body Mass Index(BMI): 38 Temperature(F): 99.0 Respiratory Rate(breaths/min): [address] ([id number]):Wound Assessments] [[id number] Page 5 of 316 [id number].pdf Page 5 of 9] Wound Number: 10 N/A N/A Photos: N/A N/A Left Calcaneus N/A N/A Wound Location: Gradually Appeared N/A N/A Wounding 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 N/A N/A Secondary Etiology: Cataracts, Arrhythmia, Coronary N/A N/A Comorbid History: Artery Disease, Hypertension, Type II Diabetes, Osteoarthritis, Neuropathy 06/05/2022 N/A N/A Date Acquired: 78 N/A N/A Weeks of Treatment: Open N/A N/A Wound Status: No N/A N/A Wound Recurrence: 1.2x2.8x0.2 N/A N/A Measurements L x W x D (cm) 2.639 N/A N/A A (cm) : rea 0.528 N/A N/A Volume (cm) : -86.60% N/A N/A % Reduction in A rea: -86.60% N/A N/A % Reduction in Volume: Grade 1 N/A N/A Classification: Medium N/A N/A Exudate A mount: Serosanguineous N/A N/A Exudate Type: red, brown N/A N/A Exudate Color: Flat and Intact N/A N/A Wound Margin: Small (1-33%) N/A N/A Granulation A mount: Pink N/A N/A Granulation Quality: Large (67-100%) N/A N/A Necrotic A mount: Fat Layer (Subcutaneous Tissue): Yes N/A  N/A Exposed Structures: Fascia: No Tendon: No Muscle: No Joint: No Bone: No Small (1-33%) N/A N/A Epithelialization: Treatment Notes Electronic Signature(s) Signed: 12/24/2022 3:52:49 PM By: Massie Kluver Entered By: Massie Kluver on 12/24/2022 10:49:47 -------------------------------------------------------------------------------- [place] Details Patient Name: Date of Service: [name], [name] [name] 12/24/2022 10:00 A M Medical Record Number: [id number] Patient Account Number: [id number] Date of Birth/Sex: Treating RN: 1946/04/19 (77 y.o. [name] Primary Care Arno Cullers: Fulton Reek Other Clinician: Massie Kluver Referring Lockie Bothun: Treating Shelli Portilla/Extender: Eldridge Dace, MICHA EL Ginny Forth in Treatment: [address] JAIDYN, RENNO ([id number]) [id number] Page 6 of 9 Nursing Diagnoses: Knowledge deficit related to causes and risk factors for pressure ulcer development Knowledge deficit related to management of pressures ulcers Potential for impaired tissue integrity related to pressure, friction, moisture, and shear Goals: Patient will remain free from development of additional pressure ulcers Date Initiated: 06/19/2022 Date Inactivated: 07/28/2022 Target Resolution Date: 06/19/2022 Goal Status: Met Patient/caregiver will verbalize risk factors for pressure ulcer development Date Initiated: 06/19/2022 Date Inactivated: 07/28/2022 Target Resolution Date: 06/19/2022 Goal Status: Met Patient/caregiver will verbalize understanding of pressure ulcer management Date Initiated: 06/19/2022 Target Resolution Date: 08/14/2022 Goal Status: Active Interventions: Assess: immobility, friction, shearing, incontinence upon admission and as needed Assess offloading mechanisms upon admission and as needed Assess potential for pressure ulcer upon admission and as needed Provide education on pressure  ulcers Notes: Wound/Skin Impairment Nursing Diagnoses: Impaired tissue integrity Goals: Patient/caregiver will verbalize understanding of skin care regimen Date Initiated: 06/19/2022 Date Inactivated: 07/10/2022 Target Resolution Date: 06/19/2022 Goal Status: Met Ulcer/skin breakdown will have a volume reduction of 30% by week 4 Date Initiated: 06/19/2022 Date Inactivated: 07/28/2022 Target Resolution Date: 07/17/2022 Goal Status: Unmet Unmet Reason: infection Ulcer/skin breakdown will have a volume reduction of 50% by week 8 Date Initiated: 07/28/2022 Target Resolution Date: 08/14/2022 Goal Status: Active Interventions: Assess patient/caregiver ability to obtain necessary supplies Assess patient/caregiver ability to perform ulcer/skin care regimen upon admission and as needed Assess ulceration(s) every visit Provide education on ulcer and skin care Treatment Activities: Skin care regimen initiated : 06/19/2022 Topical wound management initiated : 06/19/2022 Notes: Electronic Signature(s) Signed: 12/24/2022 3:52:49 PM By:  Massie Kluver Signed: 12/25/2022 3:20:50 PM By: [name], BSN, RN, CWS, [name] RN, BSN Entered By: Massie Kluver on 12/24/2022 11:11:20 -------------------------------------------------------------------------------- Pain Assessment Details Patient Name: Date of Service: [name], [name] [name] 12/24/2022 10:00 A M Medical Record Number: [id number] Patient Account Number: [id number] ADIT, PANNULLO ([id number]) [phone].pdf Page 7 of 9 Date of Birth/Sex: Treating RN: 12-Mar-1946 (77 y.o. [name] Primary Care Gracin Soohoo: Fulton Reek Other Clinician: Massie Kluver Referring Emori Kamau: Treating Alejandra Hunt/Extender: RO BSO N, MICHA EL Ginny Forth in Treatment: 26 Active Problems Location of Pain Severity and Description of Pain Patient Has Paino No Site Locations Pain Management and Medication Current Pain Management: Electronic  Signature(s) Signed: 12/24/2022 3:52:49 PM By: Massie Kluver Signed: 12/25/2022 3:20:50 PM By: [name], BSN, RN, CWS, [name] RN, BSN Entered By: Massie Kluver on 12/24/2022 10:38:49 -------------------------------------------------------------------------------- Patient/Caregiver Education Details Patient Name: Date of Service: [name], [name] [name] 3/7/2024andnbsp10:00 A M Medical Record Number: [id number] Patient Account Number: [id number] Date of Birth/Gender: Treating RN: 03-Feb-1946 (77 y.o. [name] Primary Care Physician: Fulton Reek Other Clinician: Massie Kluver Referring Physician: Treating Physician/Extender: RO BSO Delane Ginger, MICHA EL Ginny Forth in Treatment: 26 Education Assessment Education Provided To: Patient Education Topics Provided Wound/Skin Impairment: Handouts: Other: continue wound care as directed Methods: Explain/Verbal Responses: State content correctly [employer]) JOHNEDWARD, BLADEN ([id number]) [id number] Page 8 of 9 Signed: 12/24/2022 3:52:49 PM By: Massie Kluver Entered By: Massie Kluver on 12/24/2022 11:11:54 -------------------------------------------------------------------------------- Wound Assessment Details Patient Name: Date of Service: [name], [name] [name] 12/24/2022 10:00 A M Medical Record Number: [id number] Patient Account Number: [id number] Date of Birth/Sex: Treating RN: 1946-10-09 (77 y.o. [name], [name] Primary Care Daquan Crapps: Fulton Reek Other Clinician: Massie Kluver Referring Kirstan Fentress: Treating Peder Allums/Extender: RO BSO N, MICHA EL Ginny Forth in Treatment: 26 Wound Status Wound Number: 10 Primary Diabetic Wound/Ulcer of the Lower Extremity Etiology: Wound Location: Left Calcaneus Secondary Pressure Ulcer Wounding Event: Gradually Appeared Etiology: Date Acquired: 06/05/2022 Wound Open Weeks Of Treatment: 26 Status: Clustered Wound: No Comorbid Cataracts,  Arrhythmia, Coronary Artery Disease, Hypertension, History: Type II Diabetes, Osteoarthritis, Neuropathy Photos Wound Measurements Length: (cm) 1.2 Width: (cm) 2.8 Depth: (cm) 0.2 Area: (cm) 2.639 Volume: (cm) 0.528 % Reduction in Area: -86.6% % Reduction in Volume: -86.6% Epithelialization: Small (1-33%) Wound Description Classification: Grade 1 Wound Margin: Flat and Intact Exudate Amount: Medium Exudate Type: Serosanguineous Exudate Color: red, brown Foul Odor After Cleansing: No Slough/Fibrino Yes Wound Bed Granulation Amount: Small (1-33%) Exposed Structure Granulation Quality: Pink Fascia Exposed: No Necrotic Amount: Large (67-100%) Fat Layer (Subcutaneous Tissue) Exposed: Yes Necrotic Quality: Adherent Slough Tendon Exposed: No Muscle Exposed: No Joint Exposed: No Bone Exposed: No Treatment Notes SYLVESTRE, DROTAR ([id number]) [id number] Page 9 of 9 Wound #10 (Calcaneus) Wound Laterality: Left Cleanser Peri-Wound Care Topical Gentamicin Discharge Instruction: Apply as directed by Payslee Bateson. Primary Dressing Hydrofera Blue Ready Transfer Foam, 2.5x2.5 (in/in) Discharge Instruction: Apply Hydrofera Blue Ready to wound bed as directed Secondary Dressing ABD Pad 5x9 (in/in) Discharge Instruction: Cover with ABD pad Secured With Kerlix Roll Sterile or Non-Sterile 6-ply 4.5x4 (yd/yd) Discharge Instruction: Apply Kerlix as directed Tubigrip Size D, 3x10 (in/yd) Compression Wrap Compression Stockings Add-Ons Electronic Signature(s) Signed: 12/24/2022 3:52:49 PM By: Massie Kluver Signed: 12/25/2022 3:20:50 PM By: [name], BSN, RN, CWS, [name] RN, BSN Entered By: Massie Kluver on 12/24/2022 10:43:50 -------------------------------------------------------------------------------- Vitals Details Patient Name: Date of Service: [name], [name] [name] 12/24/2022 10:00 A M Medical Record  Number: [id number] Patient Account Number: [id number] Date of  Birth/Sex: Treating RN: May 29, 1946 (77 y.o. [name], [name] Primary Care Vadie Principato: Fulton Reek Other Clinician: Massie Kluver Referring Darvell Monteforte: Treating Erice Ahles/Extender: RO BSO N, MICHA EL Ginny Forth in Treatment: 26 Vital Signs Time Taken: 10:35 Temperature (F): 99.0 Height (in): 70 Pulse (bpm): 69 Weight (lbs): 265 Respiratory Rate (breaths/min): 18 Body Mass Index (BMI): 38 Blood Pressure (mmHg): 147/74 Reference Range: 80 - 120 mg / dl Electronic Signature(s) Signed: 12/24/2022 3:52:49 PM By: Massie Kluver Entered By: Massie Kluver on 12/24/2022 10:38:43

## 2022-12-26 NOTE — Progress Notes (Signed)
[name], [name] ([id number]) [id number] Page 1 of 10 Visit Report for 12/24/2022 HPI Details Patient Name: Date of Service: [name], [name] [name] 12/24/2022 10:00 A M Medical Record Number: [id number] Patient Account Number: [id number] Date of Birth/Sex: Treating RN: [birth year] (77 y.o. [name] Primary Care Provider: Fulton Reek Other Clinician: Massie Kluver Referring Provider: Treating Provider/Extender: [name], [name] in Treatment: 26 History of Present Illness HPI Description: 09/24/2020 on evaluation today patient presents today for a heel ulcer that he tells me has been present for about 2 years. He has been seeing podiatry and they have been attempting to manage this including what sounds to be a total contact cast, Unna boot, and just standard dressings otherwise as well. Most recently has been using triple antibiotic ointment. With that being said unfortunately despite everything he really has not had any significant improvement. He tells me that he cannot even really remember exactly how this began but he presumed it may have rubbed on his shoes or something of that nature. 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 He tells me that this does seem to help. Fortunately there is no signs of active infection at this time. His most recent hemoglobin A1c was 8.1 though he knows this was this year he cannot tell me the exact time. His fluid pills currently to help with some of the lower extremity edema although he does obviously have signs of venous stasis/lymphedema. Currently there is no evidence of active infection. No fevers, chills,  nausea, vomiting, or diarrhea. Patient has had fairly recent ABIs which were performed on 07/19/2020 and revealed that he has normal findings in both the ankle and toe locations bilaterally. His ABI on the right was 1.09 on the left was 1.08 with a TBI on the right of 0.88 and on the left of 0.94. Triphasic flow was noted throughout. 10/08/2020 on evaluation today patient appears to be doing pretty well in regard to his left heel currently in fact this is doing a great job and seems to be healing quite nicely. Unfortunately on his right leg he had a pile of wood that actually fell on him injuring his right leg this is somewhat erythematous has me concerned little bit about cellulitis though there is not really a good area to culture at this point. 10/24/2020 upon evaluation today patient appears to be doing well with regard to his heel ulcer. He is showing signs of improvement which is great news. His right leg is completely healed. Overall I feel like he is doing excellent and there is no signs of infection. 11/07/2020 upon evaluation today patient appears to be doing well with regard to his heel ulcer. He tells me that last week when he was unable to come in his wife actually thought that the wound was very close to closing if not closed. Then it began to "reopen again". I really feel like what may have happened as the collagen may have dried over the wound bed and that because that misunderstanding with thinking that the wound was healing. With that being said I did not see it last week I do not know that for certain. Either way I  feel like he is doing great today I see no signs of infection at this point. 11/26/2020 upon inspection today patient appears to be doing decently well in regard to his heel ulcer. He has been tolerating the dressing changes without complication. Fortunately there is no sign of active infection at this time. No fevers, chills, nausea, vomiting, or diarrhea. 12/10/2020 upon  evaluation today patient appears to be doing fairly well in regard to the wound on his heel as well as what appears to be a new wound of the left first metatarsal head plantar aspect. This seems to be an area that was callus that has split as the patient tells me has been trying to walk on his toes more has probably where this came from. With that being said there does not appear to be signs of active infection which is great news. 12/17/2020 upon evaluation today patient actually appears to be making good progress currently. Fortunately there is no evidence of active infection at this time. Overall I feel like he is very close to complete closure. 12/26/2020 upon evaluation today patient appears to be doing excellent in regard to his wounds. In fact I am not certain that these are not even completely healed on initial inspection. Overall I am very pleased with where things stand today. Good news is that they are healed he is actually get ready to go out of town and that will be helpful as well as he will be a full part of the time. Readmission: 02/04/2021 upon evaluation today patient appears to be doing well at this point in regard to his left heel that I previously saw him for. Unfortunately he is having issues with his right lower extremity. He has significant wounds at this point he also has erythema noted there is definite signs of cellulitis which is unfortunate. With that being said I think we do need to address this sooner rather than later. The good news is he did have a nice trip to the beach. He tells me that he had no issues during that time. 4/27; patient on Bactrim. Culture showed methicillin sensitive staph aureus therefore the Bactrim should be effective. 2 small areas on the right leg are healed the area on the mid aspect of the tibia almost 100% covered in a very adherent necrotic debris. We have been using silver alginate 02/20/2021 upon evaluation today patient appears to be doing well  with regard to his wound. He is showing signs of improvement which is great news overall very pleased with where things stand today. No fevers, chills, nausea, vomiting, or diarrhea. 02/27/2021 upon evaluation today patient appears to be doing well with regard to his leg ulcer. He is tolerating the dressing changes and overall appears to be doing quite excellent. I am extremely pleased with where things stand and overall I think patient is making great progress. There is no sign of active infection at this time which is also great news. 03/06/2021 upon evaluation today patient appears to be doing excellent in regard to his wounds. In fact he appears to be completely healed today based on what I am seeing. This is excellent news and overall I am extremely pleased with where he stands. Overall the patient is happy to hear this as well this has been [name], [name] ([id number]) [id number] Page 2 of 10 quite sometime coming. Readmission: 04/01/2021 patient unfortunately returns for readmission today. He tells me that he has been wearing his compression socks on the right leg daily  and he does not really know what is going on and why his legs are doing what they are doing. We did therefore go ahead and probe deeper into exactly what has been going on with him today. Subsequently the patient tells me that when he gets up and what we would call "first thing in the morning" are really 2 different things". He does not tend to sleep well so he tells me that he will often wake up at 3:00 in the morning. He will then potentially going into the living room to get in his chair where he may read a book for a little while and then potentially fall asleep back in his chair. He then subsequently wake up around 5:00 or so and then get up and in his words "putter around". This often will end with him proceeding at some point around 8 AM or 9 AM to putting on his compression socks. With  that being said this means that anywhere from the 3:00 in the morning till roughly around 9:00 in the morning he has no compression on yet he is sitting in his recliner, walking around and up and about, and this is at least 5 to 6 hours of noncompressed time. That may be our issue here but I did not realize until we discussed this further today. Fortunately there does not appear to be any signs of active infection at this time which is great news. With that being said he has multiple wounds of the bilateral lower extremities. 04/10/2021 upon evaluation today patient appears to be doing well with regard to his legs for the most part. He does look like he had some injury where his wrap slid down nonetheless he did some "doctoring on them". I do feel like most of the areas honestly have cleared back up which is great news. There does not appear to be any signs of active infection at this time which is also great news. No fevers, chills, nausea, vomiting, or diarrhea. 04/18/2021 upon evaluation today patient appears to be doing well with regard to his wounds in fact he appears to be completely healed which is great news. Fortunately there does not appear to be any signs of active infection which is great news. No fevers, chills, nausea, vomiting, or diarrhea. READMISSION 07/28/2021 This is a now 77 year old man we have had in this clinic for quite a bit of this year. 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 He wears compression stockings. 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 He comes in an old running shoes although he says he wears better shoes at home. His history is that he noticed a blister in the right posterior heel. This open. He has been applying Neosporin to it currently the wound measures 2 x 1.4 cm. 100% slough covered. Not really offloading this in any rigorous way. Past  medical history is essentially unchanged he has paroxysmal atrial fibrillation on Coumadin type 2 diabetes with a recent hemoglobin A1c of 7 on metformin. ABI in our clinic was 0.98 on the right 08/05/2021 upon evaluation today patient appears to be doing well with regard to his heel ulcer. Fortunately there does not appear to be any signs of active infection at this time. Overall I been very pleased with where things seem to be currently as far as the wound healing is concerned. Again this is first a lot of seeing him Dr.  Daphyne Miguez readmitted him last week. Nonetheless I think we are definitely making some progress here. 08/11/2021 upon evaluation today patient's wound on the heel actually showing signs of excellent improvement. I am actually very pleased with where things stand currently. No fevers, chills, nausea, vomiting, or diarrhea. There does not appear to be any need for sharp debridement today either which is also great news. 08/25/2021 upon evaluation today patient appears to be doing well with regard to his heel ulcer. This is actually looking significantly improved compared to last time I saw him. Fortunately there does not appear to be any evidence of active infection at this time. No fevers, chills, nausea, vomiting, or diarrhea. 09/01/2021 upon evaluation today patient appears to be doing decently well in regard to his wounds. Fortunately there does not appear to be any signs of active infection at this time which is great news and overall very pleased in that regard. I do not see any evidence of active infection systemically which is great news as well. No fevers, chills, nausea, vomiting, or diarrhea. I think that the heel is making excellent progress. 09/15/2021 upon evaluation today patient's heel unfortunately is significantly worse compared to what it was previous. I do believe that at this time he would benefit from switching back to something a little bit more offloading from the  cushion shoe he has right now this is more of a heel protector what I really need is a Prevalon offloading boot which I think is good to do a lot better for him than just the small heel protector. 09/23/2021 upon evaluation today patient appears to be doing a little better in regards to last week's visit. Overall I think that he is making good progress which is great news and there does not appear to be any evidence of active infection at this time. No fevers, chills, nausea, vomiting, or diarrhea. 09/30/2021 upon evaluation patient's heel is actually showing signs of good improvement which is great news. Fortunately there does not appear to be any evidence of active infection locally nor systemically at this point. No fevers, chills, nausea, vomiting, or diarrhea. 10/07/2021 upon evaluation today patient appears to be doing well currently in regard to his heel ulcer. He has been tolerating the dressing changes without complication. Fortunately I do not see any signs of active infection at this time which is great news. No fevers, chills, nausea, vomiting, or diarrhea. 12/27; wound is measuring smaller. We have been using silver alginate 10/28/2021 upon evaluation today patient appears to be doing well currently in regard to his heel ulcer. I am actually very pleased with where things stand and I think he is making excellent progress. Fortunately I do not see any signs of active infection locally nor systemically at this point which is great news. Nonetheless I do believe that the patient would benefit from a new offloading shoe and has been using it Velcro is no longer functioning properly and he tells me that he almost tripped and fell because of that today. Obviously I do not want him following that would be very bad. 11/11/2021 upon evaluation today patient appears to be doing excellent in regard to his wound. In fact this appears to be completely healed based on what I see currently. I do not see any  signs of anything open or draining and I did double check just by clearing away some of the callus around the edges of the wound to ensure that there was nothing still open or hiding underneath.  Once this was cleared away it appeared that the patient was doing significantly better at this time which is great news and there was nothing actually open. Readmission: 06-19-2022 upon evaluation today patient appears to be doing well currently in regard to his heel ulcer all things considered. He unfortunately is having a bit of breakdown in general as far as the heel is concerned not nearly as bad as what we noted last time he was here in the clinic. The good news is I do think that this should hopefully heal much more rapidly than what we noted last time. His past medical history has not changed he is on Coumadin. 07-03-2022 upon evaluation today patient appears to be doing better in regard to his heel. We will start to see some improvement here which is good news. Fortunately I do not see any evidence of infection locally or systemically at this time which is great news. No fevers, chills, nausea, vomiting, or diarrhea. 07-10-2022 upon evaluation today patient appears to be doing well currently in regard to his wound from the callus standpoint around the edges. Unfortunately from an actual wound bed standpoint he has some deep tissue injury noted at this point. Again I am not sure exactly what is going on that is causing this pressure to the region but he is definitely gotten pressure that is occurring and causing this to not heal as effectively as what we would like to see. I discussed with him today that he may need to at night when he sleeping use a pillow up underneath his calf in order to prevent the heel from touching the bed at all. I also think that it may be beneficial for him to monitor throughout his day and make sure there is no other time when he is getting the pressure to the heel. 07-23-2022 upon  evaluation today patient appears to be doing poorly currently in regard to his heel ulcer. He has been tolerating the dressing changes without [name], [name] ([id number]) [id number] Page 3 of 10 complication. Unfortunately I feel like he may be infected based on what I am seeing. 07-28-2022 I did review patient's culture results which showed positive for Proteus as well as Staphylococcus both of which would be treated with the Bactrim DS that I gave him at the last visit. This is good news and hopefully means that we should be able to apply the cast today as long as the wound itself does not appear to be doing poorly. Patient's wound bed actually showed signs of doing quite well and I am very pleased with where things stand and I do not see any signs of worsening overall which is great news. 08-04-2022 upon evaluation today patient appears to be doing well currently in regard to his heel ulcer. I am actually very pleased with where things stand and I do think this is looking significantly better. With regard to his right shin that is also showing signs of improvement which is great news. 08-10-2022 upon evaluation today patient's wound on the heel actually appears to be doing significantly better. In regard to the right anterior shin this is showing signs of healing it might even be completely healed but I still get a monitor I cannot get anything to fill away but also could not see where there was anything actually draining at this point. I am tending to think healed but I want a monitor 1 more week before I call it for sure. 08-17-2022 upon evaluation today patient  appears to be doing well currently in regard to his heel. Fortunately he is tolerating the dressing changes without complication. I do not see any signs of infection and overall I think he is making good progress here. We did get approval for the Apligraf but I think he had a $295 co-pay that would have to be  paid per application. With that being said as good as he is doing right now I do not think that is necessary but is still an option depending on how things progress if we need to speed this up quite a bit. 08/24/2022; this patient has a wound on his left heel in the setting of type 2 diabetes. We have been using Prisma. He has a heel offloading boot 08-31-2022 upon evaluation today patient appears to be doing poorly in regard to his heel ulcer which is still showing signs of bruising I am just not as happy as I was when I saw him 2 weeks ago with this. He saw Dr. Dellia Nims last week. Also did not look good at that point apparently. Nonetheless I think that the patient is still continuing to have some counterpressure getting to the wound bed unfortunately. 09-07-2022 upon evaluation today patient appears to be doing well currently in regard to his heel ulcer. He has been tolerating the dressing changes without complication. Fortunately there does not appear to be any signs of active infection locally or systemically at this time. Fortunately I do not see any evidence of active infection at this time. 09-15-2022 upon evaluation today patient appears to be doing well currently in regard to his legs which in fact appear to be completely healed this is great news. With that being said his heel does have some erythema and warmth as well as drainage I am concerned about cellulitis at this location. 09-21-2022 upon evaluation today patient's wound is actually showing signs of being a little bit smaller but still we are not doing nearly as well as what I would like to see. Fortunately I do not see any signs of infection at this time which is great news and overall I am extremely pleased in that regard. With that being said I do think that he needs to continue to elevate his leg although the edema is under great control with a compression wrap I think switching to a different dressing topically may be beneficial. My  suggestion is to switch him over to a Iodoflex dressing. 09-28-2022 upon evaluation today patient appears to be doing well with regard to his heel ulcer. This is actually showing signs of improvement which is great news and overall I am extremely pleased with where we stand today. 10-06-2022 upon evaluation today patient actually appears to be making excellent progress at this point. Fortunately there does not appear to be any signs of active infection locally nor systemically which is great news and overall I am extremely pleased with where we stand. I do feel like he is actually making some pretty good progress here as we switch to the wrap along with the Iodoflex. 12/26; patient has a wound on the tip of his left heel. This is not a weightbearing surface. He is using a heel offloading boot and carefully offloading this at other times during the day. He is using Iodoflex and Zetuvitunder 3 layer compression 10-22-2022 upon evaluation today patient appears to be doing well currently in regard to his wound. He has been tolerating the dressing changes without complication. Fortunately there does not appear to  be any signs of active infection locally nor systemically at this time. No fevers, chills, nausea, vomiting, or diarrhea. 08-30-2023 upon evaluation today patient appears to be doing well currently in regard to his heel ulcer which is showing signs of good improvement. Fortunately I do not see any evidence of infection locally nor systemically which is great news and overall I am extremely pleased with where we stand. No fevers, chills, nausea, vomiting, or diarrhea. Unfortunately he does have a wound on the side of his fifth toe where I think this did rub on the shoe. 11-05-2022 upon evaluation today patient still still showing some signs of pressure at this point and there may have even been some fluid collection at this time. Fortunately I do not see any evidence of active infection locally nor  systemically which is great news and overall I am extremely pleased with where we stand. No fevers, chills, nausea, vomiting, or diarrhea. 1/25; the patient's area on the dorsal aspect of the left fifth toe is healed. We have been using Iodoflex to the area on the left heel. The lateral wound is slightly down in length. 11-19-2022 upon evaluation today patient appears to be doing well currently in regard to his wound. He has been tolerating the dressing changes without complication. Fortunately there does not appear to be any signs of infection we been using Iodoflex up to this point. 11-26-2022 upon evaluation today patient appears to be doing poorly currently in regard to his wound. He has been tolerating the dressing changes with the Iodoflex without complication. With that being said he does not have any signs of active infection systemically though locally I do feel like there is some evidence of infection here. 12-03-2022 upon evaluation today patient appears to be doing well currently in regard to his wound this is better with the Bactrim compared to last week still there was some significant slough and biofilm buildup which is going require sharp debridement at this point. Fortunately I do not see any signs of active infection locally nor systemically which is great news. 12-11-2022 upon evaluation today patient actually appears to be doing significantly better at this time in regard to his heel. I am actually very pleased with where things stand currently. There is no signs of active infection locally or systemically at this point. 12-17-2022 upon evaluation today patient appears to be doing better as far as the overall appearance of his wound. I am actually very pleased in that regard. Fortunately there does not appear to be any signs of active infection locally nor systemically at this time. I do feel like that the patient is showing signs of excellent improvement in general. Nonetheless he needs  something better for compression I think he should go ahead and see about getting compression socks. 3/7; tip of the left heel measurements are smaller he has been using [employer]) Signed: 12/24/2022 4:44:08 PM By: Linton Ham MD Entered By: Linton Ham on 12/24/2022 11:06:37 [name] ([id number]) [id number] Page 4 of 10 -------------------------------------------------------------------------------- Physical Exam Details Patient Name: Date of Service: [name], [name] [name] 12/24/2022 10:00 A M Medical Record Number: [id number] Patient Account Number: [id number] Date of Birth/Sex: Treating RN: 1945/12/19 (77 y.o. [name] Primary Care Provider: Fulton Reek Other Clinician: Massie Kluver Referring Provider: Treating Provider/Extender: [name], [place] EL Ginny [name] in Treatment: 26 Constitutional Sitting or standing Blood Pressure is within target range for patient.. Pulse regular and within target range for  patient.Marland Kitchen Respirations regular, non-labored and within target range.. Temperature is normal and within the target range for the patient.Marland Kitchen appears in no distress. Notes Wound exam; on inspection the wound looks fairly healthy and is smaller. No evidence of infection. I did not debride this today. There was no surrounding erythema. Electronic Signature(s) Signed: 12/24/2022 4:44:08 PM By: Linton Ham MD Entered By: Linton Ham on 12/24/2022 11:07:20 -------------------------------------------------------------------------------- Physician Orders Details Patient Name: Date of Service: [name], [name] [name] 12/24/2022 10:00 A M Medical Record Number: [id number] Patient Account Number: [id number] Date of Birth/Sex: Treating RN: 12-02-1945 (77 y.o. [name] Primary Care Provider: Fulton Reek Other Clinician: Massie Kluver Referring Provider: Treating Provider/Extender: [name],  [name] in Treatment: 26 Verbal / Phone Orders: No Diagnosis Coding Follow-up Appointments Wound #10 Left Calcaneus Return Appointment in 1 week. Bathing/ Shower/ Hygiene May shower; gently cleanse wound with antibacterial soap, rinse and pat dry prior to dressing wounds Non-Wound Condition dditional non-wound orders/instructions: - Apply 40% Urea cream to dry cracked skin on feet. A Off-Loading Open toe surgical shoe with peg assist. Wound Treatment Wound #10 - Calcaneus Wound Laterality: Left [name], [name] ([id number]) [id number] Page 5 of 10 Topical: Gentamicin 3 x Per Week/30 Days Discharge Instructions: Apply as directed by provider. Prim Dressing: Hydrofera Blue Ready Transfer Foam, 2.5x2.5 (in/in) (Dispense As Written) 3 x Per Week/30 Days ary Discharge Instructions: Apply Hydrofera Blue Ready to wound bed as directed Secondary Dressing: ABD Pad 5x9 (in/in) (Generic) 3 x Per Week/30 Days Discharge Instructions: Cover with ABD pad Secured With: Kerlix Roll Sterile or Non-Sterile 6-ply 4.5x4 (yd/yd) 3 x Per Week/30 Days Discharge Instructions: Apply Kerlix as directed Secured With: Tubigrip Size D, 3x10 (in/yd) 3 x Per Week/30 Days Electronic Signature(s) Signed: 12/24/2022 3:52:49 PM By: Massie Kluver Signed: 12/24/2022 4:44:08 PM By: Linton Ham MD Entered By: Massie Kluver on 12/24/2022 11:01:38 -------------------------------------------------------------------------------- Problem List Details Patient Name: Date of Service: [name], [name] [name] 12/24/2022 10:00 A M Medical Record Number: [id number] Patient Account Number: [id number] Date of Birth/Sex: Treating RN: 05-11-1946 (77 y.o. [name] Primary Care Provider: Fulton Reek Other Clinician: Massie Kluver Referring Provider: Treating Provider/Extender: [name], [name] in Treatment: 26 Active  Problems ICD-10 Encounter Code Description Active Date MDM Diagnosis E11.621 Type 2 diabetes mellitus with foot ulcer 06/19/2022 No Yes 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sed 06/19/2022 No Yes E11.42 Type 2 diabetes mellitus with diabetic polyneuropathy 06/19/2022 No Yes Inactive Problems Resolved Problems Electronic Signature(s) Signed: 12/24/2022 4:44:08 PM By: Linton Ham MD Entered By: Linton Ham on 12/24/2022 11:05:10 [name] ([id number]) [id number] Page 6 of 10 -------------------------------------------------------------------------------- Progress Note Details Patient Name: Date of Service: [name], [name] [name] 12/24/2022 10:00 A M Medical Record Number: [id number] Patient Account Number: [id number] Date of Birth/Sex: Treating RN: 1945-12-01 (77 y.o. [name] Primary Care Provider: Fulton Reek Other Clinician: Massie Kluver Referring Provider: Treating Provider/Extender: [name], [place] EL Ginny [name] in Treatment: 26 Subjective History of Present Illness (HPI) 09/24/2020 on evaluation today patient presents today for a heel ulcer that he tells me has been present for about 2 years. He has been seeing podiatry and they have been attempting to manage this including what sounds to be a total contact cast, Unna boot, and just standard dressings otherwise as well. Most recently has been using triple antibiotic ointment.  With that being said unfortunately despite everything he really has not had any significant improvement. He tells me that he cannot even really remember exactly how this began but he presumed it may have rubbed on his shoes or something of that nature. 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 He tells me that this does seem to help. Fortunately there is no signs of active infection at this time. His most recent hemoglobin A1c was 8.1 though he knows this was this year he cannot tell me the exact time. His fluid pills currently to help with some of the lower extremity edema although he does obviously have signs of venous stasis/lymphedema. Currently there is no evidence of active infection. No fevers, chills, nausea, vomiting, or diarrhea. Patient has had fairly recent ABIs which were performed on 07/19/2020 and revealed that he has normal findings in both the ankle and toe locations bilaterally. His ABI on the right was 1.09 on the left was 1.08 with a TBI on the right of 0.88 and on the left of 0.94. Triphasic flow was noted throughout. 10/08/2020 on evaluation today patient appears to be doing pretty well in regard to his left heel currently in fact this is doing a great job and seems to be healing quite nicely. Unfortunately on his right leg he had a pile of wood that actually fell on him injuring his right leg this is somewhat erythematous has me concerned little bit about cellulitis though there is not really a good area to culture at this point. 10/24/2020 upon evaluation today patient appears to be doing well with regard to his heel ulcer. He is showing signs of improvement which is great news. His right leg is completely healed. Overall I feel like he is doing excellent and there is no signs of infection. 11/07/2020 upon evaluation today patient appears to be doing well with regard to his heel ulcer. He tells me that last week when he was unable to come in his wife actually thought that the wound was very close to closing if not closed. Then it began to "reopen again". I really feel like what may have happened as the collagen may have dried over the wound bed and that because  that misunderstanding with thinking that the wound was healing. With that being said I did not see it last week I do not know that for certain. Either way I feel like he is doing great today I see no signs of infection at this point. 11/26/2020 upon inspection today patient appears to be doing decently well in regard to his heel ulcer. He has been tolerating the dressing changes without complication. Fortunately there is no sign of active infection at this time. No fevers, chills, nausea, vomiting, or diarrhea. 12/10/2020 upon evaluation today patient appears to be doing fairly well in regard to the wound on his heel as well as what appears to be a new wound of the left first metatarsal head plantar aspect. This seems to be an area that was callus that has split as the patient tells me has been trying to walk on his toes more has probably where this came from. With that being said there does not appear to be  signs of active infection which is great news. 12/17/2020 upon evaluation today patient actually appears to be making good progress currently. Fortunately there is no evidence of active infection at this time. Overall I feel like he is very close to complete closure. 12/26/2020 upon evaluation today patient appears to be doing excellent in regard to his wounds. In fact I am not certain that these are not even completely healed on initial inspection. Overall I am very pleased with where things stand today. Good news is that they are healed he is actually get ready to go out of town and that will be helpful as well as he will be a full part of the time. Readmission: 02/04/2021 upon evaluation today patient appears to be doing well at this point in regard to his left heel that I previously saw him for. Unfortunately he is having issues with his right lower extremity. He has significant wounds at this point he also has erythema noted there is definite signs of cellulitis which is unfortunate. With that being  said I think we do need to address this sooner rather than later. The good news is he did have a nice trip to the beach. He tells me that he had no issues during that time. 4/27; patient on Bactrim. Culture showed methicillin sensitive staph aureus therefore the Bactrim should be effective. 2 small areas on the right leg are healed the area on the mid aspect of the tibia almost 100% covered in a very adherent necrotic debris. We have been using silver alginate 02/20/2021 upon evaluation today patient appears to be doing well with regard to his wound. He is showing signs of improvement which is great news overall very pleased with where things stand today. No fevers, chills, nausea, vomiting, or diarrhea. 02/27/2021 upon evaluation today patient appears to be doing well with regard to his leg ulcer. He is tolerating the dressing changes and overall appears to be doing quite excellent. I am extremely pleased with where things stand and overall I think patient is making great progress. There is no sign of active infection at this time which is also great news. 03/06/2021 upon evaluation today patient appears to be doing excellent in regard to his wounds. In fact he appears to be completely healed today based on what I am seeing. This is excellent news and overall I am extremely pleased with where he stands. Overall the patient is happy to hear this as well this has been quite sometime coming. Readmission: 04/01/2021 patient unfortunately returns for readmission today. He tells me that he has been wearing his compression socks on the right leg daily and he does not really know what is going on and why his legs are doing what they are doing. We did therefore go ahead and probe deeper into exactly what has been going [name], [name] ([id number]) [id number] Page 7 of 10 on with him today. Subsequently the patient tells me that when he gets up and what we would call "first thing in  the morning" are really 2 different things". He does not tend to sleep well so he tells me that he will often wake up at 3:00 in the morning. He will then potentially going into the living room to get in his chair where he may read a book for a little while and then potentially fall asleep back in his chair. He then subsequently wake up around 5:00 or so and then get up and in his words "putter around". This often  will end with him proceeding at some point around 8 AM or 9 AM to putting on his compression socks. With that being said this means that anywhere from the 3:00 in the morning till roughly around 9:00 in the morning he has no compression on yet he is sitting in his recliner, walking around and up and about, and this is at least 5 to 6 hours of noncompressed time. That may be our issue here but I did not realize until we discussed this further today. Fortunately there does not appear to be any signs of active infection at this time which is great news. With that being said he has multiple wounds of the bilateral lower extremities. 04/10/2021 upon evaluation today patient appears to be doing well with regard to his legs for the most part. He does look like he had some injury where his wrap slid down nonetheless he did some "doctoring on them". I do feel like most of the areas honestly have cleared back up which is great news. There does not appear to be any signs of active infection at this time which is also great news. No fevers, chills, nausea, vomiting, or diarrhea. 04/18/2021 upon evaluation today patient appears to be doing well with regard to his wounds in fact he appears to be completely healed which is great news. Fortunately there does not appear to be any signs of active infection which is great news. No fevers, chills, nausea, vomiting, or diarrhea. READMISSION 07/28/2021 This is a now 77 year old man we have had in this clinic for quite a bit of this year. He has been in here with  bilateral lower extremity leg wounds probably secondary to chronic venous insufficiency. He wears compression stockings. 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 He comes in an old running shoes although he says he wears better shoes at home. His history is that he noticed a blister in the right posterior heel. This open. He has been applying Neosporin to it currently the wound measures 2 x 1.4 cm. 100% slough covered. Not really offloading this in any rigorous way. Past medical history is essentially unchanged he has paroxysmal atrial fibrillation on Coumadin type 2 diabetes with a recent hemoglobin A1c of 7 on metformin. ABI in our clinic was 0.98 on the right 08/05/2021 upon evaluation today patient appears to be doing well with regard to his heel ulcer. Fortunately there does not appear to be any signs of active infection at this time. Overall I been very pleased with where things seem to be currently as far as the wound healing is concerned. Again this is first a lot of seeing him Dr. Dellia Nims readmitted him last week. Nonetheless I think we are definitely making some progress here. 08/11/2021 upon evaluation today patient's wound on the heel actually showing signs of excellent improvement. I am actually very pleased with where things stand currently. No fevers, chills, nausea, vomiting, or diarrhea. There does not appear to be any need for sharp debridement today either which is also great news. 08/25/2021 upon evaluation today patient appears to be doing well with regard to his heel ulcer. This is actually looking significantly improved compared to last time I saw him. Fortunately there does not appear to be any evidence of active infection at this time. No fevers, chills, nausea, vomiting, or diarrhea. 09/01/2021 upon evaluation today patient appears to be doing decently well in regard to his wounds. Fortunately there does not appear to be any  signs  of active infection at this time which is great news and overall very pleased in that regard. I do not see any evidence of active infection systemically which is great news as well. No fevers, chills, nausea, vomiting, or diarrhea. I think that the heel is making excellent progress. 09/15/2021 upon evaluation today patient's heel unfortunately is significantly worse compared to what it was previous. I do believe that at this time he would benefit from switching back to something a little bit more offloading from the cushion shoe he has right now this is more of a heel protector what I really need is a Prevalon offloading boot which I think is good to do a lot better for him than just the small heel protector. 09/23/2021 upon evaluation today patient appears to be doing a little better in regards to last week's visit. Overall I think that he is making good progress which is great news and there does not appear to be any evidence of active infection at this time. No fevers, chills, nausea, vomiting, or diarrhea. 09/30/2021 upon evaluation patient's heel is actually showing signs of good improvement which is great news. Fortunately there does not appear to be any evidence of active infection locally nor systemically at this point. No fevers, chills, nausea, vomiting, or diarrhea. 10/07/2021 upon evaluation today patient appears to be doing well currently in regard to his heel ulcer. He has been tolerating the dressing changes without complication. Fortunately I do not see any signs of active infection at this time which is great news. No fevers, chills, nausea, vomiting, or diarrhea. 12/27; wound is measuring smaller. We have been using silver alginate 10/28/2021 upon evaluation today patient appears to be doing well currently in regard to his heel ulcer. I am actually very pleased with where things stand and I think he is making excellent progress. Fortunately I do not see any signs of active infection  locally nor systemically at this point which is great news. Nonetheless I do believe that the patient would benefit from a new offloading shoe and has been using it Velcro is no longer functioning properly and he tells me that he almost tripped and fell because of that today. Obviously I do not want him following that would be very bad. 11/11/2021 upon evaluation today patient appears to be doing excellent in regard to his wound. In fact this appears to be completely healed based on what I see currently. I do not see any signs of anything open or draining and I did double check just by clearing away some of the callus around the edges of the wound to ensure that there was nothing still open or hiding underneath. Once this was cleared away it appeared that the patient was doing significantly better at this time which is great news and there was nothing actually open. Readmission: 06-19-2022 upon evaluation today patient appears to be doing well currently in regard to his heel ulcer all things considered. He unfortunately is having a bit of breakdown in general as far as the heel is concerned not nearly as bad as what we noted last time he was here in the clinic. The good news is I do think that this should hopefully heal much more rapidly than what we noted last time. His past medical history has not changed he is on Coumadin. 07-03-2022 upon evaluation today patient appears to be doing better in regard to his heel. We will start to see some improvement here which is good news.  Fortunately I do not see any evidence of infection locally or systemically at this time which is great news. No fevers, chills, nausea, vomiting, or diarrhea. 07-10-2022 upon evaluation today patient appears to be doing well currently in regard to his wound from the callus standpoint around the edges. Unfortunately from an actual wound bed standpoint he has some deep tissue injury noted at this point. Again I am not sure exactly what  is going on that is causing this pressure to the region but he is definitely gotten pressure that is occurring and causing this to not heal as effectively as what we would like to see. I discussed with him today that he may need to at night when he sleeping use a pillow up underneath his calf in order to prevent the heel from touching the bed at all. I also think that it may be beneficial for him to monitor throughout his day and make sure there is no other time when he is getting the pressure to the heel. 07-23-2022 upon evaluation today patient appears to be doing poorly currently in regard to his heel ulcer. He has been tolerating the dressing changes without complication. Unfortunately I feel like he may be infected based on what I am seeing. 07-28-2022 I did review patient's culture results which showed positive for Proteus as well as Staphylococcus both of which would be treated with the Bactrim DS that I gave him at the last visit. This is good news and hopefully means that we should be able to apply the cast today as long as the wound itself does not appear to be doing poorly. Patient's wound bed actually showed signs of doing quite well and I am very pleased with where things stand and I do not see any signs of worsening overall which is great news. [name], [name] ([id number]) [id number] Page 8 of 10 08-04-2022 upon evaluation today patient appears to be doing well currently in regard to his heel ulcer. I am actually very pleased with where things stand and I do think this is looking significantly better. With regard to his right shin that is also showing signs of improvement which is great news. 08-10-2022 upon evaluation today patient's wound on the heel actually appears to be doing significantly better. In regard to the right anterior shin this is showing signs of healing it might even be completely healed but I still get a monitor I cannot get anything to fill  away but also could not see where there was anything actually draining at this point. I am tending to think healed but I want a monitor 1 more week before I call it for sure. 08-17-2022 upon evaluation today patient appears to be doing well currently in regard to his heel. Fortunately he is tolerating the dressing changes without complication. I do not see any signs of infection and overall I think he is making good progress here. We did get approval for the Apligraf but I think he had a $295 co-pay that would have to be paid per application. With that being said as good as he is doing right now I do not think that is necessary but is still an option depending on how things progress if we need to speed this up quite a bit. 08/24/2022; this patient has a wound on his left heel in the setting of type 2 diabetes. We have been using Prisma. He has a heel offloading boot 08-31-2022 upon evaluation today patient appears to be doing poorly in  regard to his heel ulcer which is still showing signs of bruising I am just not as happy as I was when I saw him 2 weeks ago with this. He saw Dr. Dellia Nims last week. Also did not look good at that point apparently. Nonetheless I think that the patient is still continuing to have some counterpressure getting to the wound bed unfortunately. 09-07-2022 upon evaluation today patient appears to be doing well currently in regard to his heel ulcer. He has been tolerating the dressing changes without complication. Fortunately there does not appear to be any signs of active infection locally or systemically at this time. Fortunately I do not see any evidence of active infection at this time. 09-15-2022 upon evaluation today patient appears to be doing well currently in regard to his legs which in fact appear to be completely healed this is great news. With that being said his heel does have some erythema and warmth as well as drainage I am concerned about cellulitis at this  location. 09-21-2022 upon evaluation today patient's wound is actually showing signs of being a little bit smaller but still we are not doing nearly as well as what I would like to see. Fortunately I do not see any signs of infection at this time which is great news and overall I am extremely pleased in that regard. With that being said I do think that he needs to continue to elevate his leg although the edema is under great control with a compression wrap I think switching to a different dressing topically may be beneficial. My suggestion is to switch him over to a Iodoflex dressing. 09-28-2022 upon evaluation today patient appears to be doing well with regard to his heel ulcer. This is actually showing signs of improvement which is great news and overall I am extremely pleased with where we stand today. 10-06-2022 upon evaluation today patient actually appears to be making excellent progress at this point. Fortunately there does not appear to be any signs of active infection locally nor systemically which is great news and overall I am extremely pleased with where we stand. I do feel like he is actually making some pretty good progress here as we switch to the wrap along with the Iodoflex. 12/26; patient has a wound on the tip of his left heel. This is not a weightbearing surface. He is using a heel offloading boot and carefully offloading this at other times during the day. He is using Iodoflex and Zetuvitunder 3 layer compression 10-22-2022 upon evaluation today patient appears to be doing well currently in regard to his wound. He has been tolerating the dressing changes without complication. Fortunately there does not appear to be any signs of active infection locally nor systemically at this time. No fevers, chills, nausea, vomiting, or diarrhea. 08-30-2023 upon evaluation today patient appears to be doing well currently in regard to his heel ulcer which is showing signs of good improvement.  Fortunately I do not see any evidence of infection locally nor systemically which is great news and overall I am extremely pleased with where we stand. No fevers, chills, nausea, vomiting, or diarrhea. Unfortunately he does have a wound on the side of his fifth toe where I think this did rub on the shoe. 11-05-2022 upon evaluation today patient still still showing some signs of pressure at this point and there may have even been some fluid collection at this time. Fortunately I do not see any evidence of active infection locally nor systemically which  is great news and overall I am extremely pleased with where we stand. No fevers, chills, nausea, vomiting, or diarrhea. 1/25; the patient's area on the dorsal aspect of the left fifth toe is healed. We have been using Iodoflex to the area on the left heel. The lateral wound is slightly down in length. 11-19-2022 upon evaluation today patient appears to be doing well currently in regard to his wound. He has been tolerating the dressing changes without complication. Fortunately there does not appear to be any signs of infection we been using Iodoflex up to this point. 11-26-2022 upon evaluation today patient appears to be doing poorly currently in regard to his wound. He has been tolerating the dressing changes with the Iodoflex without complication. With that being said he does not have any signs of active infection systemically though locally I do feel like there is some evidence of infection here. 12-03-2022 upon evaluation today patient appears to be doing well currently in regard to his wound this is better with the Bactrim compared to last week still there was some significant slough and biofilm buildup which is going require sharp debridement at this point. Fortunately I do not see any signs of active infection locally nor systemically which is great news. 12-11-2022 upon evaluation today patient actually appears to be doing significantly better at this  time in regard to his heel. I am actually very pleased with where things stand currently. There is no signs of active infection locally or systemically at this point. 12-17-2022 upon evaluation today patient appears to be doing better as far as the overall appearance of his wound. I am actually very pleased in that regard. Fortunately there does not appear to be any signs of active infection locally nor systemically at this time. I do feel like that the patient is showing signs of excellent improvement in general. Nonetheless he needs something better for compression I think he should go ahead and see about getting compression socks. 3/7; tip of the left heel measurements are smaller he has been using Hydrofera Blue Objective Constitutional Sitting or standing Blood Pressure is within target range for patient.. Pulse regular and within target range for patient.Marland Kitchen Respirations regular, non-labored and within target range.. Temperature is normal and within the target range for the patient.Marland Kitchen appears in no distress. [name], [name] ([id number]) [id number] Page 9 of 10 Vitals Time Taken: 10:35 AM, Height: 70 in, Weight: 265 lbs, BMI: 38, Temperature: 99.0 F, Pulse: 69 bpm, Respiratory Rate: 18 breaths/min, Blood Pressure: 147/74 mmHg. General Notes: Wound exam; on inspection the wound looks fairly healthy and is smaller. No evidence of infection. I did not debride this today. There was no surrounding erythema. Integumentary (Hair, Skin) Wound #10 status is Open. Original cause of wound was Gradually Appeared. The date acquired was: 06/05/2022. The wound has been in treatment 26 weeks. The wound is located on the Left Calcaneus. The wound measures 1.2cm length x 2.8cm width x 0.2cm depth; 2.639cm^2 area and 0.528cm^3 volume. There is Fat Layer (Subcutaneous Tissue) exposed. There is a medium amount of serosanguineous drainage noted. The wound margin is flat and intact. There  is small (1-33%) pink granulation within the wound bed. There is a large (67-100%) amount of necrotic tissue within the wound bed including Adherent Slough. 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 Bathing/ Shower/ Hygiene: May shower; gently cleanse wound with antibacterial soap, rinse and pat dry prior to dressing wounds Non-Wound Condition: Additional non-wound orders/instructions: - Apply 40% Urea cream to dry cracked skin on feet. Off-Loading: Open toe surgical shoe with peg assist. WOUND #10: - Calcaneus Wound Laterality: Left Topical: Gentamicin 3 x Per Week/30 Days Discharge Instructions: Apply as directed by provider. Prim Dressing: Hydrofera Blue Ready Transfer Foam, 2.5x2.5 (in/in) (Dispense As Written) 3 x Per Week/30 Days ary Discharge Instructions: Apply Hydrofera Blue Ready to wound bed as directed Secondary Dressing: ABD Pad 5x9 (in/in) (Generic) 3 x Per Week/30 Days Discharge Instructions: Cover with ABD pad Secured With: Kerlix Roll Sterile or Non-Sterile 6-ply 4.5x4 (yd/yd) 3 x Per Week/30 Days Discharge Instructions: Apply Kerlix as directed Secured With: Tubigrip Size D, 3x10 (in/yd) 3 x Per Week/30 Days 1. In general good looking wound compared to last time. I did not debride this today 2. We are using gentamicin Hydrofera Blue under Tubigrip changing every second day 3. He tells me he keeps the pressure off this religiously Electronic Signature(s) Signed: 12/24/2022 4:44:08 PM By: Linton Ham MD Entered By: Linton Ham on 12/24/2022 11:08:18 -------------------------------------------------------------------------------- SuperBill Details Patient Name: Date of Service: [name], [name] [name]  12/24/2022 Medical Record Number: [id number] Patient Account Number: [id number] Date of Birth/Sex: Treating RN: 12-13-1945 (77 y.o. Marus, [place], [place] ([id number]) [id number] Page 10 of 10 Primary Care Provider: Fulton Reek Other Clinician: Massie Kluver Referring Provider: Treating Provider/Extender: Eldridge Dace, [name] in Treatment: 26 Diagnosis Coding ICD-10 Codes Code Description E11.621 Type 2 diabetes mellitus with foot ulcer 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sed E11.42 Type 2 diabetes mellitus with diabetic polyneuropathy Facility Procedures : CPT4 Code: [id number] Description: 99213 - WOUND CARE VISIT-LEV 3 EST PT Modifier: Quantity: 1 Physician Procedures : CPT4 Code Description Modifier [id number] - WC PHYS LEVEL 3 - EST PT ICD-10 Diagnosis Description E11.621 Type 2 diabetes mellitus with foot ulcer 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sed Quantity: 1 Electronic Signature(s) Signed: 12/24/2022 4:44:08 PM By: Linton Ham MD Entered By: Linton Ham on 12/24/2022 11:09:29

## 2022-12-31 ENCOUNTER — Ambulatory Visit: Payer: Medicare HMO | Admitting: Internal Medicine

## 2023-01-07 ENCOUNTER — Encounter: Payer: Medicare HMO | Admitting: Physician Assistant

## 2023-01-07 DIAGNOSIS — E11621 Type 2 diabetes mellitus with foot ulcer: Secondary | ICD-10-CM | POA: Diagnosis not present

## 2023-01-08 NOTE — Progress Notes (Addendum)
[name], [name] ([id number]) [id number] Page 1 of 9 Visit Report for 01/07/2023 Arrival Information Details Patient Name: Date of Service: [name], [name] J. 01/07/2023 12:45 PM Medical Record Number: [id number] Patient Account Number: [id number] Date of Birth/Sex: Treating RN: [birth year] (77 y.o. [name] Primary Care Ethelreda Sukhu: Fulton Reek Other Clinician: Massie Kluver Referring Virgen Belland: Treating Trajon Rosete/Extender: [name] in Treatment: 59 Visit Information History Since Last Visit All ordered tests and consults were completed: No Patient Arrived: [name] Added or deleted any medications: No Arrival Time: 12:50 Any new allergies or adverse reactions: No Transfer Assistance: None Had a fall or experienced change in No Patient Identification Verified: Yes activities of daily living that may affect Secondary Verification Process Completed: Yes risk of falls: Patient Requires Transmission-Based Precautions: No Signs or symptoms of abuse/neglect since No Patient Has Alerts: Yes last visito Patient Alerts: Patient on Blood Thinner Hospitalized since last visit: No Warfarin Implantable device outside of the clinic No Type II Diabetic excluding cellular tissue based products placed in the [name] since last visit: Has Dressing in Place as Prescribed: Yes Has Footwear/Offloading in Place as Yes Prescribed: Left: Surgical Shoe with Pressure Relief Insole Pain Present Now: No Electronic Signature(s) Signed: 01/07/2023 4:10:18 PM By: Massie Kluver Entered By: Massie Kluver on 01/07/2023 12:54:18 -------------------------------------------------------------------------------- Clinic Level of Care Assessment Details Patient Name: Date of Service: [name] [facility] J. 01/07/2023 12:45 PM Medical Record Number: [id number] Patient Account Number: [id number] Date of Birth/Sex: Treating RN: 1946/02/03 (77 y.o. [name] Primary Care [name]: Fulton Reek Other Clinician: Massie Kluver Referring [name]: Treating [name]/Extender: [name] in Treatment: 28 Clinic Level of Care Assessment Items TOOL 1 Quantity Score KANIELA, [name] ([id number]) [phone].pdf Page 2 of 9 []  - 0 Use when EandM and Procedure is performed on INITIAL visit ASSESSMENTS - Nursing Assessment / Reassessment []  - 0 General Physical Exam (combine w/ comprehensive assessment (listed just below) when performed on new pt. evals) []  - 0 Comprehensive Assessment (HX, ROS, Risk Assessments, Wounds Hx, etc.) ASSESSMENTS - Wound and Skin Assessment / Reassessment []  - 0 Dermatologic / Skin Assessment (not related to wound area) ASSESSMENTS - Ostomy and/or Continence Assessment and Care []  - 0 Incontinence Assessment and Management []  - 0 Ostomy Care Assessment and Management (repouching, etc.) PROCESS - Coordination of Care []  - 0 Simple Patient / Family Education for ongoing care []  - 0 Complex (extensive) Patient / Family Education for ongoing care []  - 0 Staff obtains Programmer, systems, Records, T Results / Process Orders est []  - 0 Staff telephones HHA, Nursing Homes / Clarify orders / etc []  - 0 Routine Transfer to another Facility (non-emergent condition) []  - 0 Routine Hospital Admission (non-emergent condition) []  - 0 New Admissions / Biomedical engineer / Ordering NPWT Apligraf, etc. , []  - 0 Emergency Hospital Admission (emergent condition) PROCESS - Special Needs []  - 0 Pediatric / Minor Patient Management []  - 0 Isolation Patient Management []  - 0 Hearing / Language / Visual special needs []  - 0 Assessment of Community assistance (transportation, D/C planning, etc.) []  - 0 Additional assistance / Altered mentation []  - 0 Support Surface(s) Assessment (bed, cushion, seat, etc.) INTERVENTIONS - Miscellaneous []  - 0 External ear exam []  -  0 Patient Transfer (multiple staff / Civil Service fast streamer / Similar devices) []  - 0 Simple Staple / Suture removal (25 or less) []  - 0 Complex Staple / Suture removal (26 or more) []  - 0 Hypo/Hyperglycemic  Management (do not check if billed separately) []  - 0 Ankle / Brachial Index (ABI) - do not check if billed separately Has the patient been seen at the hospital within the last three years: Yes Total Score: 0 Level Of Care: ____ Electronic Signature(s) Signed: 01/07/2023 4:10:18 PM By: Massie Kluver Entered By: Massie Kluver on 01/07/2023 13:27:48 Encounter Discharge Information Details -------------------------------------------------------------------------------- [name] ([id number]) [id number] Page 3 of 9 Patient Name: Date of Service: [name] [name] 01/07/2023 12:45 PM Medical Record Number: [id number] Patient Account Number: [id number] Date of Birth/Sex: Treating RN: 1946-03-07 (77 y.o. [name] Primary Care [name]: Fulton Reek Other Clinician: Massie Kluver Referring [name]: Treating [name]/Extender: [name] in Treatment: 28 Encounter Discharge Information Items Post Procedure Vitals Discharge Condition: Stable Temperature (F): 98.6 Ambulatory Status: Walker Pulse (bpm): 61 Discharge Destination: Home Respiratory Rate (breaths/min): 18 Transportation: Private Auto Blood Pressure (mmHg): 141/61 Accompanied By: self Schedule Follow-up Appointment: Yes Clinical Summary of Care: Electronic Signature(s) Signed: 01/07/2023 4:10:18 PM By: Massie Kluver Entered By: Massie Kluver on 01/07/2023 13:48:19 -------------------------------------------------------------------------------- Lower Extremity Assessment Details Patient Name: Date of Service: [name] [name] J. 01/07/2023 12:45 PM Medical Record Number: [id number] Patient Account Number: [id number] Date of Birth/Sex: Treating RN: 1946-06-28  (77 y.o. [name] Primary Care Renie Stelmach: Fulton Reek Other Clinician: Massie Kluver Referring [name]: Treating [name]/Extender: Maurilio Lovely Weeks in Treatment: 28 Edema Assessment Assessed: [Left: No] [Right: No] Edema: [Left: Ye] [Right: s] Calf Left: Right: Point of Measurement: 33 cm From Medial Instep 42.3 cm Ankle Left: Right: Point of Measurement: 11 cm From Medial Instep 25 cm Vascular Assessment Pulses: Dorsalis Pedis Palpable: [Left:Yes] Electronic Signature(s) Signed: 01/07/2023 4:10:18 PM By: Massie Kluver Signed: 01/07/2023 4:35:37 PM By: [name], BSN, RN, CWS, [name] RN, BSN Entered By: Massie Kluver on 01/07/2023 13:08:29 [name] ([id number].pdf Page 4 of 9 -------------------------------------------------------------------------------- Multi Wound Chart Details Patient Name: Date of Service: [name] [name] J. 01/07/2023 12:45 PM Medical Record Number: [id number] Patient Account Number: [id number] Date of Birth/Sex: Treating RN: [birth year] (77 y.o. [name] Primary Care Kruze Atchley: Fulton Reek Other Clinician: Massie Kluver Referring Grainger Mccarley: Treating Shakiera Edelson/Extender: Maurilio Lovely Weeks in Treatment: 28 Vital Signs Height(in): 70 Pulse(bpm): 75 Weight(lbs): 265 Blood Pressure(mmHg): 141/61 Body Mass Index(BMI): 38 Temperature(F): 98.6 Respiratory Rate(breaths/min): 18 [10:Photos:] [N/A:N/A] Left Calcaneus N/A N/A Wound Location: Gradually Appeared N/A N/A Wounding 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 N/A N/A Secondary Etiology: Cataracts, Arrhythmia, Coronary N/A N/A Comorbid History: Artery Disease, Hypertension, Type II Diabetes, Osteoarthritis, Neuropathy 06/05/2022 N/A N/A Date Acquired: 28 N/A N/A Weeks of Treatment: Open N/A N/A Wound Status: No N/A N/A Wound Recurrence: 1x1.7x0.1  N/A N/A Measurements L x W x D (cm) 1.335 N/A N/A A (cm) : rea 0.134 N/A N/A Volume (cm) : 5.60% N/A N/A % Reduction in A rea: 52.70% N/A N/A % Reduction in Volume: Grade 1 N/A N/A Classification: Medium N/A N/A Exudate A mount: Serosanguineous N/A N/A Exudate Type: red, brown N/A N/A Exudate Color: Flat and Intact N/A N/A Wound Margin: Small (1-33%) N/A N/A Granulation A mount: Pink N/A N/A Granulation Quality: Large (67-100%) N/A N/A Necrotic A mount: Fat Layer (Subcutaneous Tissue): Yes N/A N/A Exposed Structures: Fascia: No Tendon: No Muscle: No Joint: No Bone: No Small (1-33%) N/A N/A Epithelialization: Treatment Notes Electronic Signature(s) Signed: 01/07/2023 4:10:18 PM By: Massie Kluver Entered By: Massie Kluver on 01/07/2023 13:08:39  AFSHIN, [name] ([id number]) [id number] Page 5 of 9 -------------------------------------------------------------------------------- Multi-Disciplinary Care Plan Details Patient Name: Date of Service: [name] [name] 01/07/2023 12:45 PM Medical Record Number: [id number] Patient Account Number: [id number] Date of Birth/Sex: Treating RN: 06-29-1946 (77 y.o. [name], [name] Primary Care Mumtaz Lovins: Fulton Reek Other Clinician: Massie Kluver Referring Vona Whiters: Treating Jobin Montelongo/Extender: [name] in Treatment: 28 Active Inactive Pressure Nursing Diagnoses: Knowledge deficit related to causes and risk factors for pressure ulcer development Knowledge deficit related to management of pressures ulcers Potential for impaired tissue integrity related to pressure, friction, moisture, and shear Goals: Patient will remain free from development of additional pressure ulcers Date Initiated: 06/19/2022 Date Inactivated: 07/28/2022 Target Resolution Date: 06/19/2022 Goal Status: Met Patient/caregiver will verbalize risk factors for pressure ulcer development Date Initiated:  06/19/2022 Date Inactivated: 07/28/2022 Target Resolution Date: 06/19/2022 Goal Status: Met Patient/caregiver will verbalize understanding of pressure ulcer management Date Initiated: 06/19/2022 Target Resolution Date: 08/14/2022 Goal Status: Active Interventions: Assess: immobility, friction, shearing, incontinence upon admission and as needed Assess offloading mechanisms upon admission and as needed Assess potential for pressure ulcer upon admission and as needed Provide education on pressure ulcers Notes: Wound/Skin Impairment Nursing Diagnoses: Impaired tissue integrity Goals: Patient/caregiver will verbalize understanding of skin care regimen Date Initiated: 06/19/2022 Date Inactivated: 07/10/2022 Target Resolution Date: 06/19/2022 Goal Status: Met Ulcer/skin breakdown will have a volume reduction of 30% by week 4 Date Initiated: 06/19/2022 Date Inactivated: 07/28/2022 Target Resolution Date: 07/17/2022 Goal Status: Unmet Unmet Reason: infection Ulcer/skin breakdown will have a volume reduction of 50% by week 8 Date Initiated: 07/28/2022 Target Resolution Date: 08/14/2022 Goal Status: Active Interventions: Assess patient/caregiver ability to obtain necessary supplies Assess patient/caregiver ability to perform ulcer/skin care regimen upon admission and as needed Assess ulceration(s) every visit Provide education on ulcer and skin care Treatment Activities: SAMUELLE, FULFORD ([id number]) [phone].pdf Page 6 of 9 Skin care regimen initiated : 06/19/2022 Topical wound management initiated : 06/19/2022 Notes: Electronic Signature(s) Signed: 01/07/2023 4:10:18 PM By: Massie Kluver Signed: 01/07/2023 4:35:37 PM By: [name], BSN, RN, CWS, [name] RN, BSN Entered By: Massie Kluver on 01/07/2023 13:47:11 -------------------------------------------------------------------------------- Pain Assessment Details Patient Name: Date of Service: [name], [name] [name] 01/07/2023  12:45 PM Medical Record Number: [id number] Patient Account Number: [id number] Date of Birth/Sex: Treating RN: 11-27-1945 (77 y.o. [name] Primary Care Burk Hoctor: Fulton Reek Other Clinician: Massie Kluver Referring Maher Shon: Treating Adley Mazurowski/Extender: Maurilio Lovely Weeks in Treatment: 28 Active Problems Location of Pain Severity and Description of Pain Patient Has Paino No Site Locations Pain Management and Medication Current Pain Management: Electronic Signature(s) Signed: 01/07/2023 4:10:18 PM By: Massie Kluver Signed: 01/07/2023 4:35:37 PM By: [name], BSN, RN, CWS, [name] RN, BSN Entered By: Massie Kluver on 01/07/2023 12:58:34 [name] ([id number] Page 7 of 9 -------------------------------------------------------------------------------- Patient/Caregiver Education Details Patient Name: Date of Service: [name] [name] 3/21/2024andnbsp12:45 PM Medical Record Number: [id number] Patient Account Number: [id number] Date of Birth/Gender: Treating RN: February 14, 1946 (77 y.o. [name] Primary Care Physician: Fulton Reek Other Clinician: Massie Kluver Referring Physician: Treating Physician/Extender: [name] in Treatment: 28 Education Assessment Education Provided To: Patient Education Topics Provided Wound/Skin Impairment: Handouts: Other: continue wound care as directed Methods: Explain/Verbal Responses: State content correctly Electronic Signature(s) Signed: 01/07/2023 4:10:18 PM By: Massie Kluver Entered By: Massie Kluver on 01/07/2023 13:47:29 -------------------------------------------------------------------------------- Wound Assessment Details Patient Name: Date of Service: [name], [name] [name] 01/07/2023 12:45 PM Medical Record  Number: [id number] Patient Account Number: [id number] Date of Birth/Sex: Treating RN: [birth year] (77 y.o. [name] Primary Care  Trayvond Viets: Fulton Reek Other Clinician: Massie Kluver Referring Cassius Cullinane: Treating Izzy Doubek/Extender: Maurilio Lovely Weeks in Treatment: 28 Wound Status Wound Number: 10 Primary Diabetic Wound/Ulcer of the Lower Extremity Etiology: Wound Location: Left Calcaneus Secondary Pressure Ulcer Wounding Event: Gradually Appeared Etiology: Date Acquired: 06/05/2022 Wound Open Weeks Of Treatment: 28 Status: Clustered Wound: No Comorbid Cataracts, Arrhythmia, Coronary Artery Disease, Hypertension, History: Type II Diabetes, Osteoarthritis, Neuropathy Photos MARCELLOUS, VELTMAN ([id number]) [phone].pdf Page 8 of 9 Wound Measurements Length: (cm) 1 Width: (cm) 1.7 Depth: (cm) 0.1 Area: (cm) 1.335 Volume: (cm) 0.134 % Reduction in Area: 5.6% % Reduction in Volume: 52.7% Epithelialization: Small (1-33%) Wound Description Classification: Grade 1 Wound Margin: Flat and Intact Exudate Amount: Medium Exudate Type: Serosanguineous Exudate Color: red, brown Foul Odor After Cleansing: No Slough/Fibrino Yes Wound Bed Granulation Amount: Small (1-33%) Exposed Structure Granulation Quality: Pink Fascia Exposed: No Necrotic Amount: Large (67-100%) Fat Layer (Subcutaneous Tissue) Exposed: Yes Necrotic Quality: Adherent Slough Tendon Exposed: No Muscle Exposed: No Joint Exposed: No Bone Exposed: No Treatment Notes Wound #10 (Calcaneus) Wound Laterality: Left Cleanser Peri-Wound Care Topical Gentamicin Discharge Instruction: Apply as directed by Effa Yarrow. Primary Dressing Hydrofera Blue Ready Transfer Foam, 2.5x2.5 (in/in) Discharge Instruction: Apply Hydrofera Blue Ready to wound bed as directed Secondary Dressing ABD Pad 5x9 (in/in) Discharge Instruction: Cover with ABD pad Secured With Kerlix Roll Sterile or Non-Sterile 6-ply 4.5x4 (yd/yd) Discharge Instruction: Apply Kerlix as directed Tubigrip Size D, 3x10 (in/yd) Compression  Wrap Compression Stockings Add-Ons Electronic Signature(s) Signed: 01/07/2023 4:10:18 PM By: Massie Kluver Signed: 01/07/2023 4:35:37 PM By: [name], BSN, RN, CWS, [name] RN, BSN Entered By: Massie Kluver on 01/07/2023 13:06:51 [name] ([id number]) [phone].pdf Page 9 of 9 -------------------------------------------------------------------------------- Vitals Details Patient Name: Date of Service: [name], [name] [facility] J. 01/07/2023 12:45 PM Medical Record Number: [id number] Patient Account Number: [id number] Date of Birth/Sex: Treating RN: 24-Aug-1946 (77 y.o. [name], [name] Primary Care Jesiel Garate: Fulton Reek Other Clinician: Massie Kluver Referring Luie Laneve: Treating Zev Blue/Extender: Maurilio Lovely Weeks in Treatment: 28 Vital Signs Time Taken: 12:55 Temperature (F): 98.6 Height (in): 70 Pulse (bpm): 61 Weight (lbs): 265 Respiratory Rate (breaths/min): 18 Body Mass Index (BMI): 38 Blood Pressure (mmHg): 141/61 Reference Range: 80 - 120 mg / dl Electronic Signature(s) Signed: 01/07/2023 4:10:18 PM By: Massie Kluver Entered By: Massie Kluver on 01/07/2023 12:58:30

## 2023-01-08 NOTE — Progress Notes (Addendum)
[name], [name] ([id number]) [id number] Page 1 of 12 Visit Report for 01/07/2023 Chief Complaint Document Details Patient Name: Date of Service: [name] [facility] 01/07/2023 12:45 PM Medical Record Number: [id number] Patient Account Number: [id number] Date of Birth/Sex: Treating RN: 04-20-1946 (77 y.o. [name] Primary Care Provider: Fulton Reek Other Clinician: Massie [name] Referring Provider: Treating Provider/Extender: Maurilio Lovely Weeks in Treatment: 28 Information Obtained from: Patient Chief Complaint Left heel ulcer Electronic Signature(s) Signed: 01/07/2023 12:48:01 PM By: Worthy Keeler PA-C Entered By: Worthy Keeler on 01/07/2023 12:48:00 -------------------------------------------------------------------------------- Debridement Details Patient Name: Date of Service: [name], [name] [name] 01/07/2023 12:45 PM Medical Record Number: [id number] Patient Account Number: [id number] Date of Birth/Sex: Treating RN: 06/15/1946 (77 y.o. [name] Primary Care Provider: Fulton Reek Other Clinician: Massie [name] Referring Provider: Treating Provider/Extender: Maurilio Lovely Weeks in Treatment: 28 Debridement Performed for Assessment: Wound #10 Left Calcaneus Performed By: Physician Tommie Sams., PA-C Debridement Type: Debridement Severity of Tissue Pre Debridement: Fat layer exposed Level of Consciousness (Pre-procedure): Awake and Alert Pre-procedure Verification/Time Out Yes - 13:23 Taken: Start Time: 13:23 T Area Debrided (L x W): otal 1 (cm) x 1.7 (cm) = 1.7 (cm) Tissue and other material debrided: Viable, Non-Viable, Callus, Slough, Subcutaneous, Slough Level: Skin/Subcutaneous Tissue Debridement Description: Excisional Instrument: Curette Bleeding: Minimum Hemostasis Achieved: Pressure Response to Treatment: Procedure was tolerated well Level of Consciousness (Post- Awake and  Alert procedure): STONE, WATRING ([id number]) [id number] Page 2 of 12 Post Debridement Measurements of Total Wound Length: (cm) 1 Width: (cm) 1.7 Depth: (cm) 0.2 Volume: (cm) 0.267 Character of Wound/Ulcer Post Debridement: Stable Severity of Tissue Post Debridement: Fat layer exposed Post Procedure Diagnosis Same as Pre-procedure Electronic Signature(s) Signed: 01/07/2023 4:10:18 PM By: [name] Signed: 01/07/2023 4:35:37 PM By: [name], BSN, RN, CWS, [name] RN, BSN Signed: 01/07/2023 5:06:45 PM By: Worthy Keeler PA-C Entered By: [name] on 01/07/2023 13:25:18 -------------------------------------------------------------------------------- HPI Details Patient Name: Date of Service: [name], [name] [name] 01/07/2023 12:45 PM Medical Record Number: [id number] Patient Account Number: [id number] Date of Birth/Sex: Treating RN: 10-26-1945 (77 y.o. [name] Primary Care Provider: Fulton Reek Other Clinician: Massie [name] Referring Provider: Treating Provider/Extender: Maurilio Lovely Weeks in Treatment: 28 History of Present Illness HPI Description: 09/24/2020 on evaluation today patient presents today for a heel ulcer that he tells me has been present for about 2 years. He has been seeing podiatry and they have been attempting to manage this including what sounds to be a total contact cast, Unna boot, and just standard dressings otherwise as well. Most recently has been using triple antibiotic ointment. With that being said unfortunately despite everything he really has not had any significant improvement. He tells me that he cannot even really remember exactly how this began but he presumed it may have rubbed on his shoes or something of that nature. 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 He tells me that this does seem to help. Fortunately there is no signs of active infection at this time. His most recent hemoglobin A1c was 8.1 though he knows this was this year he cannot tell me the exact time.  His fluid pills currently to help with some of the lower extremity edema although he does obviously have signs of venous stasis/lymphedema. Currently there is no evidence of active infection. No fevers, chills, nausea, vomiting, or diarrhea. Patient has had fairly recent ABIs which were performed on 07/19/2020 and revealed that he has normal findings in both the ankle and toe locations bilaterally. His ABI on the right was 1.09 on the left was 1.08 with a TBI on the right of 0.88 and on the left of 0.94. Triphasic flow was noted throughout. 10/08/2020 on evaluation today patient appears to be doing pretty well in regard to his left heel currently in fact this is doing a great job and seems to be healing quite nicely. Unfortunately on his right leg he had a pile of wood that actually fell on him injuring his right leg this is somewhat erythematous has me concerned little bit about cellulitis though there is not really a good area to culture at this point. 10/24/2020 upon evaluation today patient appears to be doing well with regard to his heel ulcer. He is showing signs of improvement which is great news. His right leg is completely healed. Overall I feel like he is doing excellent and there is no signs of infection. 11/07/2020 upon evaluation today patient appears to be doing well with regard to his heel ulcer. He tells me that last week when he was unable to come in his wife actually thought that the wound was very close to closing if not closed. Then it began to "reopen again". I really feel like what may have happened as the collagen may have dried  over the wound bed and that because that misunderstanding with thinking that the wound was healing. With that being said I did not see it last week I do not know that for certain. Either way I feel like he is doing great today I see no signs of infection at this point. 11/26/2020 upon inspection today patient appears to be doing decently well in regard to his heel ulcer. He has been tolerating the dressing changes without complication. Fortunately there is no sign of active infection at this time. No fevers, chills, nausea, vomiting, or diarrhea. 12/10/2020 upon evaluation today patient appears to be doing fairly well in regard to the wound on his heel as well as what appears to be a new wound of the left first metatarsal head plantar aspect. This seems to be an area that was callus that has split as the patient tells me has been trying to walk on his toes more has probably where this came from. With that being said there does not appear to be signs of active infection which is great news. 12/17/2020 upon evaluation today patient actually appears to be making good progress currently. Fortunately there is no evidence of active infection at this time. Overall I feel like he is very close to complete closure. 12/26/2020 upon evaluation today patient appears to be doing excellent in regard to his wounds. In fact I am not certain that these are not even completely healed on initial inspection. Overall I am very pleased with where things stand today. Good news is that they are healed he is actually get ready to go out of town and that will be helpful as well as he will be a full part of the time. MIKLO, THREATS ([id number]) [id number] Page 3 of 12 Readmission: 02/04/2021 upon evaluation today patient appears to be doing well at this point  in regard to his left heel that I previously saw him for. Unfortunately he is having issues with his right lower extremity. He has significant  wounds at this point he also has erythema noted there is definite signs of cellulitis which is unfortunate. With that being said I think we do need to address this sooner rather than later. The good news is he did have a nice trip to the beach. He tells me that he had no issues during that time. 4/27; patient on Bactrim. Culture showed methicillin sensitive staph aureus therefore the Bactrim should be effective. 2 small areas on the right leg are healed the area on the mid aspect of the tibia almost 100% covered in a very adherent necrotic debris. We have been using silver alginate 02/20/2021 upon evaluation today patient appears to be doing well with regard to his wound. He is showing signs of improvement which is great news overall very pleased with where things stand today. No fevers, chills, nausea, vomiting, or diarrhea. 02/27/2021 upon evaluation today patient appears to be doing well with regard to his leg ulcer. He is tolerating the dressing changes and overall appears to be doing quite excellent. I am extremely pleased with where things stand and overall I think patient is making great progress. There is no sign of active infection at this time which is also great news. 03/06/2021 upon evaluation today patient appears to be doing excellent in regard to his wounds. In fact he appears to be completely healed today based on what I am seeing. This is excellent news and overall I am extremely pleased with where he stands. Overall the patient is happy to hear this as well this has been quite sometime coming. Readmission: 04/01/2021 patient unfortunately returns for readmission today. He tells me that he has been wearing his compression socks on the right leg daily and he does not really know what is going on and why his legs are doing what they are doing. We did therefore go ahead and probe deeper into exactly what has been going on with him today. Subsequently the patient tells me that when he gets up  and what we would call "first thing in the morning" are really 2 different things". He does not tend to sleep well so he tells me that he will often wake up at 3:00 in the morning. He will then potentially going into the living room to get in his chair where he may read a book for a little while and then potentially fall asleep back in his chair. He then subsequently wake up around 5:00 or so and then get up and in his words "putter around". This often will end with him proceeding at some point around 8 AM or 9 AM to putting on his compression socks. With that being said this means that anywhere from the 3:00 in the morning till roughly around 9:00 in the morning he has no compression on yet he is sitting in his recliner, walking around and up and about, and this is at least 5 to 6 hours of noncompressed time. That may be our issue here but I did not realize until we discussed this further today. Fortunately there does not appear to be any signs of active infection at this time which is great news. With that being said he has multiple wounds of the bilateral lower extremities. 04/10/2021 upon evaluation today patient appears to be doing well with regard to his legs for the most part. He does  look like he had some injury where his wrap slid down nonetheless he did some "doctoring on them". I do feel like most of the areas honestly have cleared back up which is great news. There does not appear to be any signs of active infection at this time which is also great news. No fevers, chills, nausea, vomiting, or diarrhea. 04/18/2021 upon evaluation today patient appears to be doing well with regard to his wounds in fact he appears to be completely healed which is great news. Fortunately there does not appear to be any signs of active infection which is great news. No fevers, chills, nausea, vomiting, or diarrhea. READMISSION 07/28/2021 This is a now 77 year old man we have had in this clinic for quite a bit of  this year. 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 He wears compression stockings. 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 He comes in an old running shoes although he says he wears better shoes at home. His history is that he noticed a blister in the right posterior heel. This open. He has been applying Neosporin to it currently the wound measures 2 x 1.4 cm. 100% slough covered. Not really offloading this in any rigorous way. Past medical history is essentially unchanged he has paroxysmal atrial fibrillation on Coumadin type 2 diabetes with a recent hemoglobin A1c of 7 on metformin. ABI in our clinic was 0.98 on the right 08/05/2021 upon evaluation today patient appears to be doing well with regard to his heel ulcer. Fortunately there does not appear to be any signs of active infection at this time. Overall I been very pleased with where things seem to be currently as far as the wound healing is concerned. Again this is first a lot of seeing him Dr. Dellia Nims readmitted him last week. Nonetheless I think we are definitely making some progress here. 08/11/2021 upon evaluation today patient's wound on the heel actually showing signs of excellent improvement. I am actually very pleased with where things stand currently. No fevers, chills, nausea, vomiting, or diarrhea. There does not appear to be any need for sharp debridement today either which is also great news. 08/25/2021 upon evaluation today patient appears to be doing well with regard to his heel ulcer. This is actually looking significantly improved compared to last time I saw him. Fortunately there does not appear to be any evidence of active infection at this time. No fevers, chills, nausea, vomiting, or diarrhea. 09/01/2021 upon evaluation today patient appears to be doing decently well in regard to his wounds. Fortunately there  does not appear to be any signs of active infection at this time which is great news and overall very pleased in that regard. I do not see any evidence of active infection systemically which is great news as well. No fevers, chills, nausea, vomiting, or diarrhea. I think that the heel is making excellent progress. 09/15/2021 upon evaluation today patient's heel unfortunately is significantly worse compared to what it was previous. I do believe that at this time he would benefit from switching back to something a little bit more offloading from the cushion shoe he has right now this is more of a heel protector what I really need is a Prevalon offloading boot which I think is good to do a lot better for him than just the small heel protector. 09/23/2021 upon evaluation today patient appears to be doing a little better  in regards to last week's visit. Overall I think that he is making good progress which is great news and there does not appear to be any evidence of active infection at this time. No fevers, chills, nausea, vomiting, or diarrhea. 09/30/2021 upon evaluation patient's heel is actually showing signs of good improvement which is great news. Fortunately there does not appear to be any evidence of active infection locally nor systemically at this point. No fevers, chills, nausea, vomiting, or diarrhea. 10/07/2021 upon evaluation today patient appears to be doing well currently in regard to his heel ulcer. He has been tolerating the dressing changes without complication. Fortunately I do not see any signs of active infection at this time which is great news. No fevers, chills, nausea, vomiting, or diarrhea. 12/27; wound is measuring smaller. We have been using silver alginate 10/28/2021 upon evaluation today patient appears to be doing well currently in regard to his heel ulcer. I am actually very pleased with where things stand and I think he is making excellent progress. Fortunately I do not see  any signs of active infection locally nor systemically at this point which is great news. Nonetheless I do believe that the patient would benefit from a new offloading shoe and has been using it Velcro is no longer functioning properly and he tells me that he almost tripped and fell because of that today. Obviously I do not want him following that would be very bad. 11/11/2021 upon evaluation today patient appears to be doing excellent in regard to his wound. In fact this appears to be completely healed based on what I see LOVELESS, CORRO ([id number]) [id number] Page 4 of 12 currently. I do not see any signs of anything open or draining and I did double check just by clearing away some of the callus around the edges of the wound to ensure that there was nothing still open or hiding underneath. Once this was cleared away it appeared that the patient was doing significantly better at this time which is great news and there was nothing actually open. Readmission: 06-19-2022 upon evaluation today patient appears to be doing well currently in regard to his heel ulcer all things considered. He unfortunately is having a bit of breakdown in general as far as the heel is concerned not nearly as bad as what we noted last time he was here in the clinic. The good news is I do think that this should hopefully heal much more rapidly than what we noted last time. His past medical history has not changed he is on Coumadin. 07-03-2022 upon evaluation today patient appears to be doing better in regard to his heel. We will start to see some improvement here which is good news. Fortunately I do not see any evidence of infection locally or systemically at this time which is great news. No fevers, chills, nausea, vomiting, or diarrhea. 07-10-2022 upon evaluation today patient appears to be doing well currently in regard to his wound from the callus standpoint around the edges. Unfortunately from an  actual wound bed standpoint he has some deep tissue injury noted at this point. Again I am not sure exactly what is going on that is causing this pressure to the region but he is definitely gotten pressure that is occurring and causing this to not heal as effectively as what we would like to see. I discussed with him today that he may need to at night when he sleeping use a pillow up underneath his calf in  order to prevent the heel from touching the bed at all. I also think that it may be beneficial for him to monitor throughout his day and make sure there is no other time when he is getting the pressure to the heel. 07-23-2022 upon evaluation today patient appears to be doing poorly currently in regard to his heel ulcer. He has been tolerating the dressing changes without complication. Unfortunately I feel like he may be infected based on what I am seeing. 07-28-2022 I did review patient's culture results which showed positive for Proteus as well as Staphylococcus both of which would be treated with the Bactrim DS that I gave him at the last visit. This is good news and hopefully means that we should be able to apply the cast today as long as the wound itself does not appear to be doing poorly. Patient's wound bed actually showed signs of doing quite well and I am very pleased with where things stand and I do not see any signs of worsening overall which is great news. 08-04-2022 upon evaluation today patient appears to be doing well currently in regard to his heel ulcer. I am actually very pleased with where things stand and I do think this is looking significantly better. With regard to his right shin that is also showing signs of improvement which is great news. 08-10-2022 upon evaluation today patient's wound on the heel actually appears to be doing significantly better. In regard to the right anterior shin this is showing signs of healing it might even be completely healed but I still get a monitor  I cannot get anything to fill away but also could not see where there was anything actually draining at this point. I am tending to think healed but I want a monitor 1 more week before I call it for sure. 08-17-2022 upon evaluation today patient appears to be doing well currently in regard to his heel. Fortunately he is tolerating the dressing changes without complication. I do not see any signs of infection and overall I think he is making good progress here. We did get approval for the Apligraf but I think he had a $295 co-pay that would have to be paid per application. With that being said as good as he is doing right now I do not think that is necessary but is still an option depending on how things progress if we need to speed this up quite a bit. 08/24/2022; this patient has a wound on his left heel in the setting of type 2 diabetes. We have been using Prisma. He has a heel offloading boot 08-31-2022 upon evaluation today patient appears to be doing poorly in regard to his heel ulcer which is still showing signs of bruising I am just not as happy as I was when I saw him 2 weeks ago with this. He saw Dr. Dellia Nims last week. Also did not look good at that point apparently. Nonetheless I think that the patient is still continuing to have some counterpressure getting to the wound bed unfortunately. 09-07-2022 upon evaluation today patient appears to be doing well currently in regard to his heel ulcer. He has been tolerating the dressing changes without complication. Fortunately there does not appear to be any signs of active infection locally or systemically at this time. Fortunately I do not see any evidence of active infection at this time. 09-15-2022 upon evaluation today patient appears to be doing well currently in regard to his legs which in fact appear  to be completely healed this is great news. With that being said his heel does have some erythema and warmth as well as drainage I am concerned  about cellulitis at this location. 09-21-2022 upon evaluation today patient's wound is actually showing signs of being a little bit smaller but still we are not doing nearly as well as what I would like to see. Fortunately I do not see any signs of infection at this time which is great news and overall I am extremely pleased in that regard. With that being said I do think that he needs to continue to elevate his leg although the edema is under great control with a compression wrap I think switching to a different dressing topically may be beneficial. My suggestion is to switch him over to a Iodoflex dressing. 09-28-2022 upon evaluation today patient appears to be doing well with regard to his heel ulcer. This is actually showing signs of improvement which is great news and overall I am extremely pleased with where we stand today. 10-06-2022 upon evaluation today patient actually appears to be making excellent progress at this point. Fortunately there does not appear to be any signs of active infection locally nor systemically which is great news and overall I am extremely pleased with where we stand. I do feel like he is actually making some pretty good progress here as we switch to the wrap along with the Iodoflex. 12/26; patient has a wound on the tip of his left heel. This is not a weightbearing surface. He is using a heel offloading boot and carefully offloading this at other times during the day. He is using Iodoflex and Zetuvitunder 3 layer compression 10-22-2022 upon evaluation today patient appears to be doing well currently in regard to his wound. He has been tolerating the dressing changes without complication. Fortunately there does not appear to be any signs of active infection locally nor systemically at this time. No fevers, chills, nausea, vomiting, or diarrhea. 08-30-2023 upon evaluation today patient appears to be doing well currently in regard to his heel ulcer which is showing signs of  good improvement. Fortunately I do not see any evidence of infection locally nor systemically which is great news and overall I am extremely pleased with where we stand. No fevers, chills, nausea, vomiting, or diarrhea. Unfortunately he does have a wound on the side of his fifth toe where I think this did rub on the shoe. 11-05-2022 upon evaluation today patient still still showing some signs of pressure at this point and there may have even been some fluid collection at this time. Fortunately I do not see any evidence of active infection locally nor systemically which is great news and overall I am extremely pleased with where we stand. No fevers, chills, nausea, vomiting, or diarrhea. 1/25; the patient's area on the dorsal aspect of the left fifth toe is healed. We have been using Iodoflex to the area on the left heel. The lateral wound is slightly down in length. 11-19-2022 upon evaluation today patient appears to be doing well currently in regard to his wound. He has been tolerating the dressing changes without complication. Fortunately there does not appear to be any signs of infection we been using Iodoflex up to this point. 11-26-2022 upon evaluation today patient appears to be doing poorly currently in regard to his wound. He has been tolerating the dressing changes with the Iodoflex without complication. With that being said he does not have any signs of active infection systemically  though locally I do feel like there is some evidence of infection here. 12-03-2022 upon evaluation today patient appears to be doing well currently in regard to his wound this is better with the Bactrim compared to last week still there was some significant slough and biofilm buildup which is going require sharp debridement at this point. Fortunately I do not see any signs of active infection locally nor systemically which is great news. CAMIREN, DORWARD ([id number]) [id number] Page 5 of  12 12-11-2022 upon evaluation today patient actually appears to be doing significantly better at this time in regard to his heel. I am actually very pleased with where things stand currently. There is no signs of active infection locally or systemically at this point. 12-17-2022 upon evaluation today patient appears to be doing better as far as the overall appearance of his wound. I am actually very pleased in that regard. Fortunately there does not appear to be any signs of active infection locally nor systemically at this time. I do feel like that the patient is showing signs of excellent improvement in general. Nonetheless he needs something better for compression I think he should go ahead and see about getting compression socks. 3/7; tip of the left heel measurements are smaller he has been using Hydrofera Blue 01-07-2023 upon evaluation today patient appears to be doing well currently in regard to his wound. This has not been debrided since I last saw him and it definitely shows that definitely needs some debridement today. Fortunately I do not see any evidence of active infection locally nor systemically which is great news. Electronic Signature(s) Signed: 01/07/2023 1:28:16 PM By: Worthy Keeler PA-C Entered By: Worthy Keeler on 01/07/2023 13:28:16 -------------------------------------------------------------------------------- Physical Exam Details Patient Name: Date of Service: [name], [name] [name] 01/07/2023 12:45 PM Medical Record Number: [id number] Patient Account Number: [id number] Date of Birth/Sex: Treating RN: 04/16/1946 (77 y.o. [name] Primary Care Provider: Fulton Reek Other Clinician: Massie [name] Referring Provider: Treating Provider/Extender: Maurilio Lovely Weeks in Treatment: 57 Constitutional Well-nourished and well-hydrated in no acute distress. Respiratory normal breathing without difficulty. Psychiatric this patient is able to make  decisions and demonstrates good insight into disease process. Alert and Oriented x 3. pleasant and cooperative. Notes Upon inspection patient's wound bed actually showed signs of good granulation and some areas there was biofilm and slough buildup I did perform debridement to clear this away postdebridement this looks to be doing much better. Electronic Signature(s) Signed: 01/07/2023 1:28:28 PM By: Worthy Keeler PA-C Entered By: Worthy Keeler on 01/07/2023 [id number] -------------------------------------------------------------------------------- Physician Orders Details Patient Name: Date of Service: [name], [name] [name] 01/07/2023 12:45 PM Medical Record Number: [id number] Patient Account Number: [id number] CARLTON, MACER ([id number]) [phone].pdf Page 6 of 12 Date of Birth/Sex: Treating RN: 01-14-1946 (77 y.o. 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 Bathing/ Shower/ Hygiene May shower; gently cleanse wound with antibacterial soap, rinse and pat dry prior to dressing wounds Non-Wound Condition dditional non-wound orders/instructions: - Apply 40% Urea cream to dry  cracked skin on feet. A Off-Loading Open toe surgical shoe with peg assist. Wound Treatment Wound #10 - Calcaneus Wound Laterality: Left Topical: Gentamicin 3 x Per Week/30 Days Discharge Instructions: Apply as directed by provider. Prim Dressing:  Hydrofera Blue Ready Transfer Foam, 2.5x2.5 (in/in) (Dispense As Written) 3 x Per Week/30 Days ary Discharge Instructions: Apply Hydrofera Blue Ready to wound bed as directed Secondary Dressing: ABD Pad 5x9 (in/in) (Generic) 3 x Per Week/30 Days Discharge Instructions: Cover with ABD pad Secured With: Kerlix Roll Sterile or Non-Sterile 6-ply 4.5x4 (yd/yd) 3 x Per Week/30 Days Discharge Instructions: Apply Kerlix as directed Secured With: Tubigrip Size D, 3x10 (in/yd) 3 x Per Week/30 Days Electronic Signature(s) Signed: 01/07/2023 4:10:18 PM By: [name] Signed: 01/07/2023 5:06:45 PM By: Worthy Keeler PA-C Entered By: [name] on 01/07/2023 13:27:40 -------------------------------------------------------------------------------- Problem List Details Patient Name: Date of Service: [name], [name] [name] 01/07/2023 12:45 PM Medical Record Number: [id number] Patient Account Number: [id number] Date of Birth/Sex: Treating RN: [birth year] (77 y.o. [name] Primary Care Provider: Fulton Reek Other Clinician: Massie [name] Referring Provider: Treating Provider/Extender: [name] in Treatment: [address] [place] ([id number]) [phone].pdf Page 7 of 12 Active Problems ICD-10 Encounter Code Description Active Date MDM Diagnosis E11.621 Type 2 diabetes mellitus with foot ulcer 06/19/2022 No Yes 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sed 06/19/2022 No Yes E11.42 Type 2 diabetes mellitus with diabetic polyneuropathy 06/19/2022 No Yes Inactive Problems Resolved Problems Electronic Signature(s) Signed: 01/07/2023 12:47:51 PM By: Worthy Keeler PA-C Entered By: Worthy Keeler on 01/07/2023 12:47:51 -------------------------------------------------------------------------------- Progress Note Details Patient Name: Date of Service: [name], [name] [name] 01/07/2023 12:45 PM Medical Record  Number: [id number] Patient Account Number: [id number] Date of Birth/Sex: Treating RN: Apr 16, 1946 (77 y.o. [name] Primary Care Provider: Fulton Reek Other Clinician: Massie [name] Referring Provider: Treating Provider/Extender: Maurilio Lovely Weeks in Treatment: 28 Subjective Chief Complaint Information obtained from Patient Left heel ulcer History of Present Illness (HPI) 09/24/2020 on evaluation today patient presents today for a heel ulcer that he tells me has been present for about 2 years. He has been seeing podiatry and they have been attempting to manage this including what sounds to be a total contact cast, Unna boot, and just standard dressings otherwise as well. Most recently has been using triple antibiotic ointment. With that being said unfortunately despite everything he really has not had any significant improvement. He tells me that he cannot even really remember exactly how this began but he presumed it may have rubbed on his shoes or something of that nature. 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 He tells me that this does seem to help. Fortunately there is no signs of active infection at this time. His most recent hemoglobin A1c was 8.1 though he knows this was this year he cannot tell me the exact time. His fluid pills currently to help with some of the lower extremity edema although he does obviously have signs of venous stasis/lymphedema. Currently there is no evidence of active infection. No fevers, chills, nausea, vomiting, or diarrhea. Patient has had fairly recent ABIs which were performed  on 07/19/2020 and revealed that he has normal findings in both the ankle and  toe locations bilaterally. His ABI on the right was 1.09 on the left was 1.08 with a TBI on the right of 0.88 and on the left of 0.94. Triphasic flow was noted throughout. 10/08/2020 on evaluation today patient appears to be doing pretty well in regard to his left heel currently in fact this is doing a great job and seems to be healing quite nicely. Unfortunately on his right leg he had a pile of wood that actually fell on him injuring his right leg this is somewhat erythematous has me concerned little bit about cellulitis though there is not really a good area to culture at this point. 10/24/2020 upon evaluation today patient appears to be doing well with regard to his heel ulcer. He is showing signs of improvement which is great news. His right leg is completely healed. Overall I feel like he is doing excellent and there is no signs of infection. 11/07/2020 upon evaluation today patient appears to be doing well with regard to his heel ulcer. He tells me that last week when he was unable to come in his wife actually thought that the wound was very close to closing if not closed. Then it began to "reopen again". I really feel like what may have happened as the collagen may have dried over the wound bed and that because that misunderstanding with thinking that the wound was healing. With that being said I did not BOWAN, FOWLE ([id number]) [id number] Page 8 of 12 see it last week I do not know that for certain. Either way I feel like he is doing great today I see no signs of infection at this point. 11/26/2020 upon inspection today patient appears to be doing decently well in regard to his heel ulcer. He has been tolerating the dressing changes without complication. Fortunately there is no sign of active infection at this time. No fevers, chills, nausea, vomiting, or diarrhea. 12/10/2020 upon evaluation today patient appears to be doing fairly well in regard to the wound on his  heel as well as what appears to be a new wound of the left first metatarsal head plantar aspect. This seems to be an area that was callus that has split as the patient tells me has been trying to walk on his toes more has probably where this came from. With that being said there does not appear to be signs of active infection which is great news. 12/17/2020 upon evaluation today patient actually appears to be making good progress currently. Fortunately there is no evidence of active infection at this time. Overall I feel like he is very close to complete closure. 12/26/2020 upon evaluation today patient appears to be doing excellent in regard to his wounds. In fact I am not certain that these are not even completely healed on initial inspection. Overall I am very pleased with where things stand today. Good news is that they are healed he is actually get ready to go out of town and that will be helpful as well as he will be a full part of the time. Readmission: 02/04/2021 upon evaluation today patient appears to be doing well at this point in regard to his left heel that I previously saw him for. Unfortunately he is having issues with his right lower extremity. He has significant wounds at this point he also has erythema noted there is definite signs of cellulitis which is unfortunate. With that  being said I think we do need to address this sooner rather than later. The good news is he did have a nice trip to the beach. He tells me that he had no issues during that time. 4/27; patient on Bactrim. Culture showed methicillin sensitive staph aureus therefore the Bactrim should be effective. 2 small areas on the right leg are healed the area on the mid aspect of the tibia almost 100% covered in a very adherent necrotic debris. We have been using silver alginate 02/20/2021 upon evaluation today patient appears to be doing well with regard to his wound. He is showing signs of improvement which is great news  overall very pleased with where things stand today. No fevers, chills, nausea, vomiting, or diarrhea. 02/27/2021 upon evaluation today patient appears to be doing well with regard to his leg ulcer. He is tolerating the dressing changes and overall appears to be doing quite excellent. I am extremely pleased with where things stand and overall I think patient is making great progress. There is no sign of active infection at this time which is also great news. 03/06/2021 upon evaluation today patient appears to be doing excellent in regard to his wounds. In fact he appears to be completely healed today based on what I am seeing. This is excellent news and overall I am extremely pleased with where he stands. Overall the patient is happy to hear this as well this has been quite sometime coming. Readmission: 04/01/2021 patient unfortunately returns for readmission today. He tells me that he has been wearing his compression socks on the right leg daily and he does not really know what is going on and why his legs are doing what they are doing. We did therefore go ahead and probe deeper into exactly what has been going on with him today. Subsequently the patient tells me that when he gets up and what we would call "first thing in the morning" are really 2 different things". He does not tend to sleep well so he tells me that he will often wake up at 3:00 in the morning. He will then potentially going into the living room to get in his chair where he may read a book for a little while and then potentially fall asleep back in his chair. He then subsequently wake up around 5:00 or so and then get up and in his words "putter around". This often will end with him proceeding at some point around 8 AM or 9 AM to putting on his compression socks. With that being said this means that anywhere from the 3:00 in the morning till roughly around 9:00 in the morning he has no compression on yet he is sitting in his recliner,  walking around and up and about, and this is at least 5 to 6 hours of noncompressed time. That may be our issue here but I did not realize until we discussed this further today. Fortunately there does not appear to be any signs of active infection at this time which is great news. With that being said he has multiple wounds of the bilateral lower extremities. 04/10/2021 upon evaluation today patient appears to be doing well with regard to his legs for the most part. He does look like he had some injury where his wrap slid down nonetheless he did some "doctoring on them". I do feel like most of the areas honestly have cleared back up which is great news. There does not appear to be any signs of active  infection at this time which is also great news. No fevers, chills, nausea, vomiting, or diarrhea. 04/18/2021 upon evaluation today patient appears to be doing well with regard to his wounds in fact he appears to be completely healed which is great news. Fortunately there does not appear to be any signs of active infection which is great news. No fevers, chills, nausea, vomiting, or diarrhea. READMISSION 07/28/2021 This is a now 77 year old man we have had in this clinic for quite a bit of this year. 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 He wears compression stockings. 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 He comes in an old running shoes although he says he wears better shoes at home. His history is that he noticed a blister in the right posterior heel. This open. He has been applying Neosporin to it currently the wound measures 2 x 1.4 cm. 100% slough covered. Not really offloading this in any rigorous way. Past medical history is essentially unchanged he has paroxysmal atrial fibrillation on Coumadin type 2 diabetes with a recent hemoglobin A1c of 7 on metformin. ABI in our clinic was  0.98 on the right 08/05/2021 upon evaluation today patient appears to be doing well with regard to his heel ulcer. Fortunately there does not appear to be any signs of active infection at this time. Overall I been very pleased with where things seem to be currently as far as the wound healing is concerned. Again this is first a lot of seeing him Dr. Dellia Nims readmitted him last week. Nonetheless I think we are definitely making some progress here. 08/11/2021 upon evaluation today patient's wound on the heel actually showing signs of excellent improvement. I am actually very pleased with where things stand currently. No fevers, chills, nausea, vomiting, or diarrhea. There does not appear to be any need for sharp debridement today either which is also great news. 08/25/2021 upon evaluation today patient appears to be doing well with regard to his heel ulcer. This is actually looking significantly improved compared to last time I saw him. Fortunately there does not appear to be any evidence of active infection at this time. No fevers, chills, nausea, vomiting, or diarrhea. 09/01/2021 upon evaluation today patient appears to be doing decently well in regard to his wounds. Fortunately there does not appear to be any signs of active infection at this time which is great news and overall very pleased in that regard. I do not see any evidence of active infection systemically which is great news as well. No fevers, chills, nausea, vomiting, or diarrhea. I think that the heel is making excellent progress. 09/15/2021 upon evaluation today patient's heel unfortunately is significantly worse compared to what it was previous. I do believe that at this time he would benefit from switching back to something a little bit more offloading from the cushion shoe he has right now this is more of a heel protector what I really need is a Prevalon offloading boot which I think is good to do a lot better for him than just the  small heel protector. 09/23/2021 upon evaluation today patient appears to be doing a little better in regards to last week's visit. Overall I think that he is making good progress which LARRELL, FREDIANI ([id number]) [id number] Page 9 of 12 is great news and there does not appear to be any evidence of active infection at this time. No fevers,  chills, nausea, vomiting, or diarrhea. 09/30/2021 upon evaluation patient's heel is actually showing signs of good improvement which is great news. Fortunately there does not appear to be any evidence of active infection locally nor systemically at this point. No fevers, chills, nausea, vomiting, or diarrhea. 10/07/2021 upon evaluation today patient appears to be doing well currently in regard to his heel ulcer. He has been tolerating the dressing changes without complication. Fortunately I do not see any signs of active infection at this time which is great news. No fevers, chills, nausea, vomiting, or diarrhea. 12/27; wound is measuring smaller. We have been using silver alginate 10/28/2021 upon evaluation today patient appears to be doing well currently in regard to his heel ulcer. I am actually very pleased with where things stand and I think he is making excellent progress. Fortunately I do not see any signs of active infection locally nor systemically at this point which is great news. Nonetheless I do believe that the patient would benefit from a new offloading shoe and has been using it Velcro is no longer functioning properly and he tells me that he almost tripped and fell because of that today. Obviously I do not want him following that would be very bad. 11/11/2021 upon evaluation today patient appears to be doing excellent in regard to his wound. In fact this appears to be completely healed based on what I see currently. I do not see any signs of anything open or draining and I did double check just by clearing away some of the  callus around the edges of the wound to ensure that there was nothing still open or hiding underneath. Once this was cleared away it appeared that the patient was doing significantly better at this time which is great news and there was nothing actually open. Readmission: 06-19-2022 upon evaluation today patient appears to be doing well currently in regard to his heel ulcer all things considered. He unfortunately is having a bit of breakdown in general as far as the heel is concerned not nearly as bad as what we noted last time he was here in the clinic. The good news is I do think that this should hopefully heal much more rapidly than what we noted last time. His past medical history has not changed he is on Coumadin. 07-03-2022 upon evaluation today patient appears to be doing better in regard to his heel. We will start to see some improvement here which is good news. Fortunately I do not see any evidence of infection locally or systemically at this time which is great news. No fevers, chills, nausea, vomiting, or diarrhea. 07-10-2022 upon evaluation today patient appears to be doing well currently in regard to his wound from the callus standpoint around the edges. Unfortunately from an actual wound bed standpoint he has some deep tissue injury noted at this point. Again I am not sure exactly what is going on that is causing this pressure to the region but he is definitely gotten pressure that is occurring and causing this to not heal as effectively as what we would like to see. I discussed with him today that he may need to at night when he sleeping use a pillow up underneath his calf in order to prevent the heel from touching the bed at all. I also think that it may be beneficial for him to monitor throughout his day and make sure there is no other time when he is getting the pressure to the heel. 07-23-2022 upon evaluation  today patient appears to be doing poorly currently in regard to his heel ulcer.  He has been tolerating the dressing changes without complication. Unfortunately I feel like he may be infected based on what I am seeing. 07-28-2022 I did review patient's culture results which showed positive for Proteus as well as Staphylococcus both of which would be treated with the Bactrim DS that I gave him at the last visit. This is good news and hopefully means that we should be able to apply the cast today as long as the wound itself does not appear to be doing poorly. Patient's wound bed actually showed signs of doing quite well and I am very pleased with where things stand and I do not see any signs of worsening overall which is great news. 08-04-2022 upon evaluation today patient appears to be doing well currently in regard to his heel ulcer. I am actually very pleased with where things stand and I do think this is looking significantly better. With regard to his right shin that is also showing signs of improvement which is great news. 08-10-2022 upon evaluation today patient's wound on the heel actually appears to be doing significantly better. In regard to the right anterior shin this is showing signs of healing it might even be completely healed but I still get a monitor I cannot get anything to fill away but also could not see where there was anything actually draining at this point. I am tending to think healed but I want a monitor 1 more week before I call it for sure. 08-17-2022 upon evaluation today patient appears to be doing well currently in regard to his heel. Fortunately he is tolerating the dressing changes without complication. I do not see any signs of infection and overall I think he is making good progress here. We did get approval for the Apligraf but I think he had a $295 co-pay that would have to be paid per application. With that being said as good as he is doing right now I do not think that is necessary but is still an option depending on how things progress if we  need to speed this up quite a bit. 08/24/2022; this patient has a wound on his left heel in the setting of type 2 diabetes. We have been using Prisma. He has a heel offloading boot 08-31-2022 upon evaluation today patient appears to be doing poorly in regard to his heel ulcer which is still showing signs of bruising I am just not as happy as I was when I saw him 2 weeks ago with this. He saw Dr. Dellia Nims last week. Also did not look good at that point apparently. Nonetheless I think that the patient is still continuing to have some counterpressure getting to the wound bed unfortunately. 09-07-2022 upon evaluation today patient appears to be doing well currently in regard to his heel ulcer. He has been tolerating the dressing changes without complication. Fortunately there does not appear to be any signs of active infection locally or systemically at this time. Fortunately I do not see any evidence of active infection at this time. 09-15-2022 upon evaluation today patient appears to be doing well currently in regard to his legs which in fact appear to be completely healed this is great news. With that being said his heel does have some erythema and warmth as well as drainage I am concerned about cellulitis at this location. 09-21-2022 upon evaluation today patient's wound is actually showing signs of being a  little bit smaller but still we are not doing nearly as well as what I would like to see. Fortunately I do not see any signs of infection at this time which is great news and overall I am extremely pleased in that regard. With that being said I do think that he needs to continue to elevate his leg although the edema is under great control with a compression wrap I think switching to a different dressing topically may be beneficial. My suggestion is to switch him over to a Iodoflex dressing. 09-28-2022 upon evaluation today patient appears to be doing well with regard to his heel ulcer. This is actually  showing signs of improvement which is great news and overall I am extremely pleased with where we stand today. 10-06-2022 upon evaluation today patient actually appears to be making excellent progress at this point. Fortunately there does not appear to be any signs of active infection locally nor systemically which is great news and overall I am extremely pleased with where we stand. I do feel like he is actually making some pretty good progress here as we switch to the wrap along with the Iodoflex. 12/26; patient has a wound on the tip of his left heel. This is not a weightbearing surface. He is using a heel offloading boot and carefully offloading this at other times during the day. He is using Iodoflex and Zetuvitunder 3 layer compression 10-22-2022 upon evaluation today patient appears to be doing well currently in regard to his wound. He has been tolerating the dressing changes without complication. Fortunately there does not appear to be any signs of active infection locally nor systemically at this time. No fevers, chills, nausea, vomiting, or diarrhea. 08-30-2023 upon evaluation today patient appears to be doing well currently in regard to his heel ulcer which is showing signs of good improvement. Fortunately I do not see any evidence of infection locally nor systemically which is great news and overall I am extremely pleased with where we stand. No fevers, chills, nausea, vomiting, or diarrhea. Unfortunately he does have a wound on the side of his fifth toe where I think this did rub on the shoe. 11-05-2022 upon evaluation today patient still still showing some signs of pressure at this point and there may have even been some fluid collection at this time. Fortunately I do not see any evidence of active infection locally nor systemically which is great news and overall I am extremely pleased with where we ENRIGUE, SAULNIER ([id number]) [phone].pdf Page 10 of 12 stand.  No fevers, chills, nausea, vomiting, or diarrhea. 1/25; the patient's area on the dorsal aspect of the left fifth toe is healed. We have been using Iodoflex to the area on the left heel. The lateral wound is slightly down in length. 11-19-2022 upon evaluation today patient appears to be doing well currently in regard to his wound. He has been tolerating the dressing changes without complication. Fortunately there does not appear to be any signs of infection we been using Iodoflex up to this point. 11-26-2022 upon evaluation today patient appears to be doing poorly currently in regard to his wound. He has been tolerating the dressing changes with the Iodoflex without complication. With that being said he does not have any signs of active infection systemically though locally I do feel like there is some evidence of infection here. 12-03-2022 upon evaluation today patient appears to be doing well currently in regard to his wound this is better with the Bactrim compared  to last week still there was some significant slough and biofilm buildup which is going require sharp debridement at this point. Fortunately I do not see any signs of active infection locally nor systemically which is great news. 12-11-2022 upon evaluation today patient actually appears to be doing significantly better at this time in regard to his heel. I am actually very pleased with where things stand currently. There is no signs of active infection locally or systemically at this point. 12-17-2022 upon evaluation today patient appears to be doing better as far as the overall appearance of his wound. I am actually very pleased in that regard. Fortunately there does not appear to be any signs of active infection locally nor systemically at this time. I do feel like that the patient is showing signs of excellent improvement in general. Nonetheless he needs something better for compression I think he should go ahead and see about getting  compression socks. 3/7; tip of the left heel measurements are smaller he has been using Hydrofera Blue 01-07-2023 upon evaluation today patient appears to be doing well currently in regard to his wound. This has not been debrided since I last saw him and it definitely shows that definitely needs some debridement today. Fortunately I do not see any evidence of active infection locally nor systemically which is great news. Objective Constitutional Well-nourished and well-hydrated in no acute distress. Vitals Time Taken: 12:55 PM, Height: 70 in, Weight: 265 lbs, BMI: 38, Temperature: 98.6 F, Pulse: 61 bpm, Respiratory Rate: 18 breaths/min, Blood Pressure: 141/61 mmHg. Respiratory normal breathing without difficulty. Psychiatric this patient is able to make decisions and demonstrates good insight into disease process. Alert and Oriented x 3. pleasant and cooperative. General Notes: Upon inspection patient's wound bed actually showed signs of good granulation and some areas there was biofilm and slough buildup I did perform debridement to clear this away postdebridement this looks to be doing much better. Integumentary (Hair, Skin) Wound #10 status is Open. Original cause of wound was Gradually Appeared. The date acquired was: 06/05/2022. The wound has been in treatment 28 weeks. The wound is located on the Left Calcaneus. The wound measures 1cm length x 1.7cm width x 0.1cm depth; 1.335cm^2 area and 0.134cm^3 volume. There is Fat Layer (Subcutaneous Tissue) exposed. There is a medium amount of serosanguineous drainage noted. The wound margin is flat and intact. There is small (1-33%) pink granulation within the wound bed. There is a large (67-100%) amount of necrotic tissue within the wound bed including Adherent Slough. 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 There was a Excisional Skin/Subcutaneous Tissue Debridement with a total area of 1.7 sq cm performed by Tommie Sams., PA-C. With the following instrument(s): Curette to remove Viable and Non-Viable tissue/material. Material removed includes Callus, Subcutaneous Tissue, and Slough. A time out was conducted at 13:23, prior to the start of the procedure. A Minimum amount of bleeding was controlled with Pressure. The procedure was tolerated well. Post Debridement Measurements: 1cm length x 1.7cm width x 0.2cm depth; 0.267cm^3 volume. ZEYDEN, LOUCKS ([id number]) [id number] Page 11 of 12 Character of Wound/Ulcer Post Debridement is stable. Severity of Tissue Post Debridement is: Fat layer exposed.  Post procedure Diagnosis Wound #10: Same as Pre-Procedure Plan Follow-up Appointments: Wound #10 Left Calcaneus: Return Appointment in 1 week. Bathing/ Shower/ Hygiene: May shower; gently cleanse wound with antibacterial soap, rinse and pat dry prior to dressing wounds Non-Wound Condition: Additional non-wound orders/instructions: - Apply 40% Urea cream to dry cracked skin on feet. Off-Loading: Open toe surgical shoe with peg assist. WOUND #10: - Calcaneus Wound Laterality: Left Topical: Gentamicin 3 x Per Week/30 Days Discharge Instructions: Apply as directed by provider. Prim Dressing: Hydrofera Blue Ready Transfer Foam, 2.5x2.5 (in/in) (Dispense As Written) 3 x Per Week/30 Days ary Discharge Instructions: Apply Hydrofera Blue Ready to wound bed as directed Secondary Dressing: ABD Pad 5x9 (in/in) (Generic) 3 x Per Week/30 Days Discharge Instructions: Cover with ABD pad Secured With: Kerlix Roll Sterile or Non-Sterile 6-ply 4.5x4 (yd/yd) 3 x Per  Week/30 Days Discharge Instructions: Apply Kerlix as directed Secured With: Tubigrip Size D, 3x10 (in/yd) 3 x Per Week/30 Days 1. I would recommend currently that we have the patient continue to monitor for any signs of infection or worsening. Based on what I am seeing I do believe that he is making good progress here. Overall I am going to stick with the gentamicin and Hydrofera Blue. 2. Also can recommend the patient should continue with the ABD pad to cover and subsequently were using Tubigrip size D starting to change this more frequently at home that is been very good for him. We will see patient back for reevaluation in 1 week here in the clinic. If anything worsens or changes patient will contact our office for additional recommendations. Electronic Signature(s) Signed: 01/07/2023 1:28:56 PM By: Worthy Keeler PA-C Entered By: Worthy Keeler on 01/07/2023 13:28:56 -------------------------------------------------------------------------------- SuperBill Details Patient Name: Date of Service: [name], [name] [name] 01/07/2023 Medical Record Number: [id number] Patient Account Number: [id number] Date of Birth/Sex: Treating RN: [id number] (77 y.o. [name] Primary Care Provider: Fulton Reek Other Clinician: Massie [name] Referring Provider: Treating Provider/Extender: Maurilio Lovely Weeks in Treatment: 28 Diagnosis Coding ICD-10 Codes Code Description E11.621 Type 2 diabetes mellitus with foot ulcer 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sed E11.42 Type 2 diabetes mellitus with diabetic polyneuropathy Facility Procedures : NOLIN, GRANIER Code: [id number] [facility] ([id number] Description: [id number] - DEB SUBQ TISSUE 20 SQ CM/< ) [phone] Modifier: [id number] Page Quantity: 1 12 of 12 : CPT4 Code: I Description: CD-10 Diagnosis Description L97.522 Non-pressure chronic ulcer of other part of left foot  with fat layer exposed Modifier: Quantity: Physician Procedures : CPT4 Code Description Modifier [id number] - WC PHYS SUBQ TISS 20 SQ CM ICD-10 Diagnosis Description 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sed Quantity: 1 Electronic Signature(s) Signed: 01/07/2023 1:29:04 PM By: Worthy Keeler PA-C Entered By: Worthy Keeler on 01/07/2023 13:29:03

## 2023-01-14 ENCOUNTER — Encounter: Payer: Medicare HMO | Admitting: Physician Assistant

## 2023-01-14 DIAGNOSIS — E11621 Type 2 diabetes mellitus with foot ulcer: Secondary | ICD-10-CM | POA: Diagnosis not present

## 2023-01-14 NOTE — Progress Notes (Addendum)
[name], [name] ([id number]) [id number] Page 1 of 11 Visit Report for 01/14/2023 Chief Complaint Document Details Patient Name: Date of Service: [name], [name] [facility] 01/14/2023 12:45 PM Medical Record Number: [id number] Patient Account Number: [id number] Date of Birth/Sex: Treating RN: Jul 27, 1946 (77 y.o. [name] Primary Care Provider: [name] Other Clinician: Betha [name] Referring Provider: Treating Provider/Extender: [name] in Treatment: 29 Information Obtained from: Patient Chief Complaint Left heel ulcer Electronic Signature(s) Signed: 01/14/2023 12:49:48 PM By: [name] Kelp PA-C Entered By: [name] on 01/14/2023 12:49:48 -------------------------------------------------------------------------------- Debridement Details Patient Name: Date of Service: [name], [name] [name] 01/14/2023 12:45 PM Medical Record Number: [id number] Patient Account Number: [id number] Date of Birth/Sex: Treating RN: December 07, 1945 (77 y.o. [name], [name] Primary Care Provider: [name] Other Clinician: Betha [name] Referring Provider: Treating Provider/Extender: [name] in Treatment: 29 Debridement Performed for Assessment: Wound #10 Left Calcaneus Performed By: Physician [name]., PA-C Debridement Type: Debridement Severity of Tissue Pre Debridement: Fat layer exposed Level of Consciousness (Pre-procedure): Awake and Alert Pre-procedure Verification/Time Out Yes - 13:12 Taken: Start Time: 13:12 T Area Debrided (L x W): otal 1.2 (cm) x 2.6 (cm) = 3.12 (cm) Tissue and other material debrided: Viable, Non-Viable, Callus, Slough, Subcutaneous, Biofilm, Slough Level: Skin/Subcutaneous Tissue Debridement Description: Excisional Instrument: Curette Bleeding: Minimum Hemostasis Achieved: Pressure Response to Treatment: Procedure was tolerated well Level of Consciousness (Post- Awake  and Alert procedure): Post Debridement Measurements of Total Wound Length: (cm) 1.2 Width: (cm) 2.6 Depth: (cm) 0.2 Volume: (cm) 0.49 Character of Wound/Ulcer Post Debridement: Stable Severity of Tissue Post Debridement: Fat layer exposed Post Procedure Diagnosis Same as Pre-procedure Electronic Signature(s) Signed: 01/14/2023 4:02:24 PM By: [name] Kelp PA-C Signed: 01/14/2023 4:25:05 PM By: [name] Signed: 01/22/2023 9:18:45 AM By: [name], BSN, RN, CWS, [name] RN, BSN Entered By: [name] on 01/14/2023 13:14:59 [name] ([id number]) [id number] Page 2 of 11 -------------------------------------------------------------------------------- HPI Details Patient Name: Date of Service: [name], [name] [name] 01/14/2023 12:45 PM Medical Record Number: [id number] Patient Account Number: [id number] Date of Birth/Sex: Treating RN: [birth year] (76 y.o. [name] Primary Care Provider: [name] Other Clinician: Betha [name] Referring Provider: Treating Provider/Extender: [name] in Treatment: 29 History of Present Illness HPI Description: 09/24/2020 on evaluation today patient presents today for a heel ulcer that he tells me has been present for about 2 years. He has been seeing podiatry and they have been attempting to manage this including what sounds to be a total contact cast, Unna boot, and just standard dressings otherwise as well. Most recently has been using triple antibiotic ointment. With that being said unfortunately despite everything he really has not had any significant improvement. He tells me that he cannot even really remember exactly how this began but he presumed it may have rubbed on his shoes or something of that nature. 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 He also does have chronic pain in the way of neuropathy which he takes medications for including [name] and methadone. He tells me that this does seem to help. Fortunately there is no signs of active infection at this time. His most recent hemoglobin A1c was 8.1 though he knows this was this year he cannot tell me the exact  time. His fluid pills currently to help with some of the lower extremity edema although he does obviously have signs of venous stasis/lymphedema. Currently there is no evidence of active infection. No fevers, chills, nausea, vomiting, or diarrhea. Patient has had fairly recent ABIs which were performed on 07/19/2020 and revealed that he has normal findings in both the ankle and toe locations bilaterally. His ABI on the right was 1.09 on the left was 1.08 with a TBI on the right of 0.88 and on the left of 0.94. Triphasic flow was noted throughout. 10/08/2020 on evaluation today patient appears to be doing pretty well in regard to his left heel currently in fact this is doing a great job and seems to be healing quite nicely. Unfortunately on his right leg he had a pile of wood that actually fell on him injuring his right leg this is somewhat erythematous has me concerned little bit about cellulitis though there is not really a good area to culture at this point. 10/24/2020 upon evaluation today patient appears to be doing well with regard to his heel ulcer. He is showing signs of improvement which is great news. His right leg is completely healed. Overall I feel like he is doing excellent and there is no signs of infection. 11/07/2020 upon evaluation today patient appears to be doing well with regard to his heel ulcer. He tells me that last week when he was unable to come in his wife actually thought that the wound was very close to closing if not closed. Then it began to "reopen again". I really feel like what may have happened as the collagen may  have dried over the wound bed and that because that misunderstanding with thinking that the wound was healing. With that being said I did not see it last week I do not know that for certain. Either way I feel like he is doing great today I see no signs of infection at this point. 11/26/2020 upon inspection today patient appears to be doing decently well in regard to his heel ulcer. He has been tolerating the dressing changes without complication. Fortunately there is no sign of active infection at this time. No fevers, chills, nausea, vomiting, or diarrhea. 12/10/2020 upon evaluation today patient appears to be doing fairly well in regard to the wound on his heel as well as what appears to be a new wound of the left first metatarsal head plantar aspect. This seems to be an area that was callus that has split as the patient tells me has been trying to walk on his toes more has probably where this came from. With that being said there does not appear to be signs of active infection which is great news. 12/17/2020 upon evaluation today patient actually appears to be making good progress currently. Fortunately there is no evidence of active infection at this time. Overall I feel like he is very close to complete closure. 12/26/2020 upon evaluation today patient appears to be doing excellent in regard to his wounds. In fact I am not certain that these are not even completely healed on initial inspection. Overall I am very pleased with where things stand today. Good news is that they are healed he is actually get ready to go out of town and that will be helpful as well as he will be a full part of the time. Readmission: 02/04/2021 upon evaluation today patient appears to be doing well at this point in regard to his left heel that I  previously saw him for. Unfortunately he is having issues with his right lower extremity. He has significant wounds at this point he also has erythema noted there is definite signs of  cellulitis which is unfortunate. With that being said I think we do need to address this sooner rather than later. The good news is he did have a nice trip to the beach. He tells me that he had no issues during that time. 4/27; patient on Bactrim. Culture showed methicillin sensitive staph aureus therefore the Bactrim should be effective. 2 small areas on the right leg are healed the area on the mid aspect of the tibia almost 100% covered in a very adherent necrotic debris. We have been using silver alginate 02/20/2021 upon evaluation today patient appears to be doing well with regard to his wound. He is showing signs of improvement which is great news overall very pleased with where things stand today. No fevers, chills, nausea, vomiting, or diarrhea. 02/27/2021 upon evaluation today patient appears to be doing well with regard to his leg ulcer. He is tolerating the dressing changes and overall appears to be doing quite excellent. I am extremely pleased with where things stand and overall I think patient is making great progress. There is no sign of active infection at this time which is also great news. 03/06/2021 upon evaluation today patient appears to be doing excellent in regard to his wounds. In fact he appears to be completely healed today based on what I am seeing. This is excellent news and overall I am extremely pleased with where he stands. Overall the patient is happy to hear this as well this has been quite sometime coming. Readmission: 04/01/2021 patient unfortunately returns for readmission today. He tells me that he has been wearing his compression socks on the right leg daily and he does not really know what is going on and why his legs are doing what they are doing. We did therefore go ahead and probe deeper into exactly what has been going on with him today. Subsequently the patient tells me that when he gets up and what we would call "first thing in the morning" are really 2 different  things". He does not tend to sleep well so he tells me that he will often wake up at 3:00 in the morning. He will then potentially going into the living room to get in his chair where he may read a book for a little while and then potentially fall asleep back in his chair. He then subsequently wake up around 5:00 or so and then get up and in his words "putter around". This often will end with him proceeding at some point around 8 AM or 9 AM to putting on his compression socks. With that being said this means that anywhere from the 3:00 in the morning till roughly around 9:00 in the morning he has no compression on yet he is sitting in his recliner, walking around and up and about, and this is at least 5 to 6 hours of noncompressed time. That may be our issue here but I did not realize until we discussed this further today. Fortunately there does not appear to be any signs of active infection at this time which is great news. With that being said he has multiple wounds of the bilateral lower extremities. 04/10/2021 upon evaluation today patient appears to be doing well with regard to his legs for the most part. He does look like he had some injury where his  wrap slid down nonetheless he did some "doctoring on them". I do feel like most of the areas honestly have cleared back up which is great news. There does not appear to be any signs of active infection at this time which is also great news. No fevers, chills, nausea, vomiting, or diarrhea. [name] FolkFORSYTH, Elia J ([id number]) [id number] Page 3 of 11 04/18/2021 upon evaluation today patient appears to be doing well with regard to his wounds in fact he appears to be completely healed which is great news. Fortunately there does not appear to be any signs of active infection which is great news. No fevers, chills, nausea, vomiting, or diarrhea. READMISSION 07/28/2021 This is a now 77 year old man we have had in this clinic for quite  a bit of this year. 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 He wears compression stockings. 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 He comes in an old running shoes although he says he wears better shoes at home. His history is that he noticed a blister in the right posterior heel. This open. He has been applying Neosporin to it currently the wound measures 2 x 1.4 cm. 100% slough covered. Not really offloading this in any rigorous way. Past medical history is essentially unchanged he has paroxysmal atrial fibrillation on Coumadin type 2 diabetes with a recent hemoglobin A1c of 7 on metformin. ABI in our clinic was 0.98 on the right 08/05/2021 upon evaluation today patient appears to be doing well with regard to his heel ulcer. Fortunately there does not appear to be any signs of active infection at this time. Overall I been very pleased with where things seem to be currently as far as the wound healing is concerned. Again this is first a lot of seeing him Dr. Leanord Hawkingobson readmitted him last week. Nonetheless I think we are definitely making some progress here. 08/11/2021 upon evaluation today patient's wound on the heel actually showing signs of excellent improvement. I am actually very pleased with where things stand currently. No fevers, chills, nausea, vomiting, or diarrhea. There does not appear to be any need for sharp debridement today either which is also great news. 08/25/2021 upon evaluation today patient appears to be doing well with regard to his heel ulcer. This is actually looking significantly improved compared to last time I saw him. Fortunately there does not appear to be any evidence of active infection at this time. No fevers, chills, nausea, vomiting, or diarrhea. 09/01/2021 upon evaluation today patient appears to be doing decently well in regard to his wounds.  Fortunately there does not appear to be any signs of active infection at this time which is great news and overall very pleased in that regard. I do not see any evidence of active infection systemically which is great news as well. No fevers, chills, nausea, vomiting, or diarrhea. I think that the heel is making excellent progress. 09/15/2021 upon evaluation today patient's heel unfortunately is significantly worse compared to what it was previous. I do believe that at this time he would benefit from switching back to something a little bit more offloading from the cushion shoe he has right now this is more of a heel protector what I really need is a Prevalon offloading boot which I think is good to do a lot better for him than just the small heel protector. 09/23/2021 upon evaluation today patient appears to be doing a little  better in regards to last week's visit. Overall I think that he is making good progress which is great news and there does not appear to be any evidence of active infection at this time. No fevers, chills, nausea, vomiting, or diarrhea. 09/30/2021 upon evaluation patient's heel is actually showing signs of good improvement which is great news. Fortunately there does not appear to be any evidence of active infection locally nor systemically at this point. No fevers, chills, nausea, vomiting, or diarrhea. 10/07/2021 upon evaluation today patient appears to be doing well currently in regard to his heel ulcer. He has been tolerating the dressing changes without complication. Fortunately I do not see any signs of active infection at this time which is great news. No fevers, chills, nausea, vomiting, or diarrhea. 12/27; wound is measuring smaller. We have been using silver alginate 10/28/2021 upon evaluation today patient appears to be doing well currently in regard to his heel ulcer. I am actually very pleased with where things stand and I think he is making excellent progress.  Fortunately I do not see any signs of active infection locally nor systemically at this point which is great news. Nonetheless I do believe that the patient would benefit from a new offloading shoe and has been using it Velcro is no longer functioning properly and he tells me that he almost tripped and fell because of that today. Obviously I do not want him following that would be very bad. 11/11/2021 upon evaluation today patient appears to be doing excellent in regard to his wound. In fact this appears to be completely healed based on what I see currently. I do not see any signs of anything open or draining and I did double check just by clearing away some of the callus around the edges of the wound to ensure that there was nothing still open or hiding underneath. Once this was cleared away it appeared that the patient was doing significantly better at this time which is great news and there was nothing actually open. Readmission: 06-19-2022 upon evaluation today patient appears to be doing well currently in regard to his heel ulcer all things considered. He unfortunately is having a bit of breakdown in general as far as the heel is concerned not nearly as bad as what we noted last time he was here in the clinic. The good news is I do think that this should hopefully heal much more rapidly than what we noted last time. His past medical history has not changed he is on Coumadin. 07-03-2022 upon evaluation today patient appears to be doing better in regard to his heel. We will start to see some improvement here which is good news. Fortunately I do not see any evidence of infection locally or systemically at this time which is great news. No fevers, chills, nausea, vomiting, or diarrhea. 07-10-2022 upon evaluation today patient appears to be doing well currently in regard to his wound from the callus standpoint around the edges. Unfortunately from an actual wound bed standpoint he has some deep tissue injury  noted at this point. Again I am not sure exactly what is going on that is causing this pressure to the region but he is definitely gotten pressure that is occurring and causing this to not heal as effectively as what we would like to see. I discussed with him today that he may need to at night when he sleeping use a pillow up underneath his calf in order to prevent the heel from touching the  bed at all. I also think that it may be beneficial for him to monitor throughout his day and make sure there is no other time when he is getting the pressure to the heel. 07-23-2022 upon evaluation today patient appears to be doing poorly currently in regard to his heel ulcer. He has been tolerating the dressing changes without complication. Unfortunately I feel like he may be infected based on what I am seeing. 07-28-2022 I did review patient's culture results which showed positive for Proteus as well as Staphylococcus both of which would be treated with the Bactrim DS that I gave him at the last visit. This is good news and hopefully means that we should be able to apply the cast today as long as the wound itself does not appear to be doing poorly. Patient's wound bed actually showed signs of doing quite well and I am very pleased with where things stand and I do not see any signs of worsening overall which is great news. 08-04-2022 upon evaluation today patient appears to be doing well currently in regard to his heel ulcer. I am actually very pleased with where things stand and I do think this is looking significantly better. With regard to his right shin that is also showing signs of improvement which is great news. 08-10-2022 upon evaluation today patient's wound on the heel actually appears to be doing significantly better. In regard to the right anterior shin this is showing signs of healing it might even be completely healed but I still get a monitor I cannot get anything to fill away but also could not see  where there was anything actually draining at this point. I am tending to think healed but I want a monitor 1 more week before I call it for sure. 08-17-2022 upon evaluation today patient appears to be doing well currently in regard to his heel. Fortunately he is tolerating the dressing changes without complication. I do not see any signs of infection and overall I think he is making good progress here. We did get approval for the Apligraf but I think he had a $295 co-pay that would have to be paid per application. With that being said as good as he is doing right now I do not think that is necessary but is still an option depending on how things progress if we need to speed this up quite a bit. [name], [name] ([id number]) [id number] Page 4 of 11 08/24/2022; this patient has a wound on his left heel in the setting of type 2 diabetes. We have been using Prisma. He has a heel offloading boot 08-31-2022 upon evaluation today patient appears to be doing poorly in regard to his heel ulcer which is still showing signs of bruising I am just not as happy as I was when I saw him 2 [name] ago with this. He saw Dr. Leanord Hawking last week. Also did not look good at that point apparently. Nonetheless I think that the patient is still continuing to have some counterpressure getting to the wound bed unfortunately. 09-07-2022 upon evaluation today patient appears to be doing well currently in regard to his heel ulcer. He has been tolerating the dressing changes without complication. Fortunately there does not appear to be any signs of active infection locally or systemically at this time. Fortunately I do not see any evidence of active infection at this time. 09-15-2022 upon evaluation today patient appears to be doing well currently in regard to his legs which in fact  appear to be completely healed this is great news. With that being said his heel does have some erythema and warmth as well as  drainage I am concerned about cellulitis at this location. 09-21-2022 upon evaluation today patient's wound is actually showing signs of being a little bit smaller but still we are not doing nearly as well as what I would like to see. Fortunately I do not see any signs of infection at this time which is great news and overall I am extremely pleased in that regard. With that being said I do think that he needs to continue to elevate his leg although the edema is under great control with a compression wrap I think switching to a different dressing topically may be beneficial. My suggestion is to switch him over to a Iodoflex dressing. 09-28-2022 upon evaluation today patient appears to be doing well with regard to his heel ulcer. This is actually showing signs of improvement which is great news and overall I am extremely pleased with where we stand today. 10-06-2022 upon evaluation today patient actually appears to be making excellent progress at this point. Fortunately there does not appear to be any signs of active infection locally nor systemically which is great news and overall I am extremely pleased with where we stand. I do feel like he is actually making some pretty good progress here as we switch to the wrap along with the Iodoflex. 12/26; patient has a wound on the tip of his left heel. This is not a weightbearing surface. He is using a heel offloading boot and carefully offloading this at other times during the day. He is using Iodoflex and Zetuvitunder 3 layer compression 10-22-2022 upon evaluation today patient appears to be doing well currently in regard to his wound. He has been tolerating the dressing changes without complication. Fortunately there does not appear to be any signs of active infection locally nor systemically at this time. No fevers, chills, nausea, vomiting, or diarrhea. 08-30-2023 upon evaluation today patient appears to be doing well currently in regard to his heel ulcer  which is showing signs of good improvement. Fortunately I do not see any evidence of infection locally nor systemically which is great news and overall I am extremely pleased with where we stand. No fevers, chills, nausea, vomiting, or diarrhea. Unfortunately he does have a wound on the side of his fifth toe where I think this did rub on the shoe. 11-05-2022 upon evaluation today patient still still showing some signs of pressure at this point and there may have even been some fluid collection at this time. Fortunately I do not see any evidence of active infection locally nor systemically which is great news and overall I am extremely pleased with where we stand. No fevers, chills, nausea, vomiting, or diarrhea. 1/25; the patient's area on the dorsal aspect of the left fifth toe is healed. We have been using Iodoflex to the area on the left heel. The lateral wound is slightly down in length. 11-19-2022 upon evaluation today patient appears to be doing well currently in regard to his wound. He has been tolerating the dressing changes without complication. Fortunately there does not appear to be any signs of infection we been using Iodoflex up to this point. 11-26-2022 upon evaluation today patient appears to be doing poorly currently in regard to his wound. He has been tolerating the dressing changes with the Iodoflex without complication. With that being said he does not have any signs of active infection  systemically though locally I do feel like there is some evidence of infection here. 12-03-2022 upon evaluation today patient appears to be doing well currently in regard to his wound this is better with the Bactrim compared to last week still there was some significant slough and biofilm buildup which is going require sharp debridement at this point. Fortunately I do not see any signs of active infection locally nor systemically which is great news. 12-11-2022 upon evaluation today patient actually  appears to be doing significantly better at this time in regard to his heel. I am actually very pleased with where things stand currently. There is no signs of active infection locally or systemically at this point. 12-17-2022 upon evaluation today patient appears to be doing better as far as the overall appearance of his wound. I am actually very pleased in that regard. Fortunately there does not appear to be any signs of active infection locally nor systemically at this time. I do feel like that the patient is showing signs of excellent improvement in general. Nonetheless he needs something better for compression I think he should go ahead and see about getting compression socks. 3/7; tip of the left heel measurements are smaller he has been using Hydrofera Blue 01-07-2023 upon evaluation today patient appears to be doing well currently in regard to his wound. This has not been debrided since I last saw him and it definitely shows that definitely needs some debridement today. Fortunately I do not see any evidence of active infection locally nor systemically which is great news. 01-14-2023 upon evaluation today patient appears to be doing well currently although he does have some erythema and warmth to the heel I feel like he may have developed an infection yet again. This is something that is been ongoing issue we have been trying to manage his foot best we could. With that being said I do think he may need to go back on the Bactrim this has done well for him in the past. Electronic Signature(s) Signed: 01/14/2023 1:18:32 PM By: [name] Kelp PA-C Entered By: [name] on 01/14/2023 13:18:32 -------------------------------------------------------------------------------- Physical Exam Details Patient Name: Date of Service: [name], [name] [name] 01/14/2023 12:45 PM Medical Record Number: [id number] Patient Account Number: [id number] Date of Birth/Sex: Treating RN: [birth year] (77 y.o. [name] Primary Care Provider: [name] Other Clinician: Betha [name] Referring Provider: Treating Provider/Extender: [name] in Treatment: [address], [place] J ([id number]) [id number] Page 5 of 11 Constitutional Obese and well-hydrated in no acute distress. Respiratory normal breathing without difficulty. Psychiatric this patient is able to make decisions and demonstrates good insight into disease process. Alert and Oriented x 3. pleasant and cooperative. Notes Upon inspection patient's wound bed actually showed signs of good granulation epithelization at this point. Fortunately I do not see any signs of active infection locally nor systemically which is great news and overall I am extremely pleased with where we stand. patient does seem to have some signs of cellulitis at this point. Fortunately he is not having any actual pain currently. Electronic Signature(s) Signed: 01/14/2023 1:19:04 PM By: [name] Kelp PA-C Entered By: [name] on 01/14/2023 13:19:04 -------------------------------------------------------------------------------- Physician Orders Details Patient Name: Date of Service: [name], [name] [name] 01/14/2023 12:45 PM Medical Record Number: [id number] Patient Account Number: [id number] Date of Birth/Sex: Treating RN: September 06, 1946 (77 y.o. 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 Bathing/ Shower/ Hygiene May shower; gently cleanse wound with antibacterial soap, rinse and pat dry prior to dressing wounds Non-Wound Condition dditional non-wound orders/instructions: - Apply 40% Urea cream to dry cracked skin on feet. A Off-Loading Open toe surgical shoe with peg assist. Medications-Please add to medication list. ntibiotics - Start Bactrim P.O. A Wound Treatment Wound #10 - Calcaneus Wound Laterality: Left Topical: Gentamicin 3 x Per Week/30 Days Discharge Instructions: Apply as directed by provider. Prim Dressing: Hydrofera Blue Ready Transfer Foam, 2.5x2.5 (in/in) (Dispense As Written) 3 x Per Week/30 Days ary Discharge Instructions: Apply Hydrofera Blue Ready to wound bed as directed Secondary Dressing: ABD Pad 5x9 (in/in) (Generic) 3 x Per Week/30 Days Discharge Instructions: Cover with ABD pad Secured With: Kerlix Roll Sterile or Non-Sterile 6-ply 4.5x4 (yd/yd) 3 x Per Week/30 Days Discharge Instructions: Apply Kerlix as directed Secured With: Tubigrip Size D, 3x10 (in/yd) 3 x Per Week/30 Days JOMARI, BARTNIK ([id number]) [phone].pdf Page 6 of 11 Patient Medications llergies: tetracycline A Notifications Medication Indication Start End 01/14/2023 Bactrim DS DOSE 1 - oral 800 mg-160 mg tablet - 1 tablet oral twice a day x 14 days Electronic Signature(s) Signed: 01/14/2023 1:20:48 PM By: [name] Kelp PA-C Entered By: [name] on 01/14/2023 13:20:48 -------------------------------------------------------------------------------- Problem List Details Patient Name: Date of Service: [name], [name] [name] 01/14/2023 12:45 PM Medical Record Number: [id number] Patient Account Number: [id number] Date of Birth/Sex: Treating RN: [birth year] (77 y.o. [name], [name] Primary Care Provider: [name] Other Clinician: Betha [name] Referring Provider: Treating Provider/Extender: [name] in Treatment: 29 Active Problems ICD-10 Encounter Code Description Active Date MDM Diagnosis E11.621 Type 2 diabetes mellitus with foot ulcer 06/19/2022 No Yes 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sed 06/19/2022 No Yes E11.42 Type 2 diabetes mellitus with diabetic polyneuropathy 06/19/2022 No Yes Inactive Problems Resolved Problems Electronic Signature(s) Signed: 01/14/2023 12:49:46 PM By: [name] Kelp PA-C Entered By: [name] on 01/14/2023 12:49:45 -------------------------------------------------------------------------------- Progress Note Details Patient Name: Date of Service: [name], [name] [name] 01/14/2023 12:45 PM Medical Record Number: [id number] Patient Account Number: [id number] Date of Birth/Sex: Treating RN: May 01, 1946 (77 y.o. [name] Primary Care Provider: [name] Other Clinician: Betha [name] Referring Provider: Treating Provider/Extender: [name] in Treatment: 29 Subjective Chief Complaint Information obtained from Patient Left heel ulcer History of Present Illness (HPI) 09/24/2020 on evaluation today patient presents today for a heel ulcer that he tells me has been present for about 2 years. He has been seeing podiatry and they have been attempting to manage this including what sounds to be a total contact cast, Unna boot, and just standard dressings otherwise as well. [address] [name], [name] ([id number]) [id number] Page 7 of 11 recently has been using triple antibiotic ointment. With that being said unfortunately despite everything he really has not had any significant improvement. He tells me that he cannot even really remember exactly how this began but he presumed it may have rubbed on his shoes or something of that nature. 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 He also does have chronic pain in the way of neuropathy which he takes medications for including [name] and methadone. He tells me that this does seem to help. Fortunately there is no signs of active infection at this time. His most recent hemoglobin A1c was 8.1 though he knows this was this year he cannot tell me the exact time. His fluid pills currently to help with some of the lower extremity edema although he does obviously have signs of venous stasis/lymphedema. Currently there is no evidence of active infection. No fevers, chills, nausea, vomiting, or diarrhea. Patient has had fairly recent ABIs which were performed on 07/19/2020 and revealed that he has normal findings in both the ankle and toe locations bilaterally. His ABI on the right was 1.09 on the left was 1.08 with a TBI on the right of 0.88 and on the left of 0.94. Triphasic flow was noted throughout. 10/08/2020 on evaluation today patient appears to be doing pretty well in regard to his left heel currently in fact this is doing a great job and seems to be healing quite nicely. Unfortunately on his right leg he had a pile of wood that actually fell on him injuring his right leg this is somewhat erythematous has me concerned little bit about cellulitis though there is not really a good area to culture at this point. 10/24/2020 upon evaluation today patient appears to be doing well with regard to his heel ulcer. He is showing signs of improvement which is great news. His right leg is completely healed. Overall I feel like he is doing excellent and there is no signs of infection. 11/07/2020 upon evaluation today patient appears to be doing well with regard to his heel ulcer. He tells me that last week when he was unable to come in his wife actually thought that the wound was very close to closing if not closed. Then it began to "reopen again". I  really feel like what may have happened as the collagen may have dried over the wound bed and that because that misunderstanding with thinking that the wound was healing. With that being said I did not see it last week I do not know that for certain. Either way I feel like he is doing great today I see no signs of infection at this point. 11/26/2020 upon inspection today patient appears to be doing decently well in regard to his heel ulcer. He has been tolerating the dressing changes without complication. Fortunately there is no sign of active infection at this time. No fevers, chills, nausea, vomiting, or diarrhea. 12/10/2020 upon evaluation today patient appears to be doing fairly well in regard to the wound on his heel as well as what appears to be a new wound of the left first metatarsal head plantar aspect. This seems to be an area that was callus that has split as the patient tells me has been trying to walk on his toes more has probably where this came from. With that being said there does not appear to be signs of active infection which is great news. 12/17/2020 upon evaluation today patient actually appears to be making good progress currently. Fortunately there is no evidence of active infection at this time. Overall I feel like he is very close to complete closure. 12/26/2020 upon  evaluation today patient appears to be doing excellent in regard to his wounds. In fact I am not certain that these are not even completely healed on initial inspection. Overall I am very pleased with where things stand today. Good news is that they are healed he is actually get ready to go out of town and that will be helpful as well as he will be a full part of the time. Readmission: 02/04/2021 upon evaluation today patient appears to be doing well at this point in regard to his left heel that I previously saw him for. Unfortunately he is having issues with his right lower extremity. He has significant wounds at this  point he also has erythema noted there is definite signs of cellulitis which is unfortunate. With that being said I think we do need to address this sooner rather than later. The good news is he did have a nice trip to the beach. He tells me that he had no issues during that time. 4/27; patient on Bactrim. Culture showed methicillin sensitive staph aureus therefore the Bactrim should be effective. 2 small areas on the right leg are healed the area on the mid aspect of the tibia almost 100% covered in a very adherent necrotic debris. We have been using silver alginate 02/20/2021 upon evaluation today patient appears to be doing well with regard to his wound. He is showing signs of improvement which is great news overall very pleased with where things stand today. No fevers, chills, nausea, vomiting, or diarrhea. 02/27/2021 upon evaluation today patient appears to be doing well with regard to his leg ulcer. He is tolerating the dressing changes and overall appears to be doing quite excellent. I am extremely pleased with where things stand and overall I think patient is making great progress. There is no sign of active infection at this time which is also great news. 03/06/2021 upon evaluation today patient appears to be doing excellent in regard to his wounds. In fact he appears to be completely healed today based on what I am seeing. This is excellent news and overall I am extremely pleased with where he stands. Overall the patient is happy to hear this as well this has been quite sometime coming. Readmission: 04/01/2021 patient unfortunately returns for readmission today. He tells me that he has been wearing his compression socks on the right leg daily and he does not really know what is going on and why his legs are doing what they are doing. We did therefore go ahead and probe deeper into exactly what has been going on with him today. Subsequently the patient tells me that when he gets up and what we  would call "first thing in the morning" are really 2 different things". He does not tend to sleep well so he tells me that he will often wake up at 3:00 in the morning. He will then potentially going into the living room to get in his chair where he may read a book for a little while and then potentially fall asleep back in his chair. He then subsequently wake up around 5:00 or so and then get up and in his words "putter around". This often will end with him proceeding at some point around 8 AM or 9 AM to putting on his compression socks. With that being said this means that anywhere from the 3:00 in the morning till roughly around 9:00 in the morning he has no compression on yet he is sitting in his recliner, walking  around and up and about, and this is at least 5 to 6 hours of noncompressed time. That may be our issue here but I did not realize until we discussed this further today. Fortunately there does not appear to be any signs of active infection at this time which is great news. With that being said he has multiple wounds of the bilateral lower extremities. 04/10/2021 upon evaluation today patient appears to be doing well with regard to his legs for the most part. He does look like he had some injury where his wrap slid down nonetheless he did some "doctoring on them". I do feel like most of the areas honestly have cleared back up which is great news. There does not appear to be any signs of active infection at this time which is also great news. No fevers, chills, nausea, vomiting, or diarrhea. 04/18/2021 upon evaluation today patient appears to be doing well with regard to his wounds in fact he appears to be completely healed which is great news. Fortunately there does not appear to be any signs of active infection which is great news. No fevers, chills, nausea, vomiting, or diarrhea. READMISSION 07/28/2021 This is a now 77 year old man we have had in this clinic for quite a bit of this year.  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 He wears compression stockings. 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 He comes in an old running shoes although he says he wears better shoes at home. His history is that he noticed a blister in the right posterior heel. This open. He has been applying Neosporin to it currently the wound measures 2 x 1.4 cm. 100% slough covered. Not really offloading this in any rigorous way. Past medical history is essentially unchanged he has paroxysmal atrial fibrillation on Coumadin type 2 diabetes with a recent hemoglobin A1c of 7 on metformin. ABI in our clinic was 0.98 on the right HAMEED, KOLAR ([id number]) [phone].pdf Page 8 of 11 08/05/2021 upon evaluation today patient appears to be doing well with regard to his heel ulcer. Fortunately there does not appear to be any signs of active infection at this time. Overall I been very pleased with where things seem to be currently as far as the wound healing is concerned. Again this is first a lot of seeing him Dr. Leanord Hawking readmitted him last week. Nonetheless I think we are definitely making some progress here. 08/11/2021 upon evaluation today patient's wound on the heel actually showing signs of excellent improvement. I am actually very pleased with where things stand currently. No fevers, chills, nausea, vomiting, or diarrhea. There does not appear to be any need for sharp debridement today either which is also great news. 08/25/2021 upon evaluation today patient appears to be doing well with regard to his heel ulcer. This is actually looking significantly improved compared to last time I saw him. Fortunately there does not appear to be any evidence of active infection at this time. No fevers, chills, nausea, vomiting, or diarrhea. 09/01/2021 upon evaluation today patient  appears to be doing decently well in regard to his wounds. Fortunately there does not appear to be any signs of active infection at this time which is great news and overall very pleased in that regard. I do not see any evidence of active infection systemically which is great news as well. No fevers, chills, nausea, vomiting, or diarrhea. I think that  the heel is making excellent progress. 09/15/2021 upon evaluation today patient's heel unfortunately is significantly worse compared to what it was previous. I do believe that at this time he would benefit from switching back to something a little bit more offloading from the cushion shoe he has right now this is more of a heel protector what I really need is a Prevalon offloading boot which I think is good to do a lot better for him than just the small heel protector. 09/23/2021 upon evaluation today patient appears to be doing a little better in regards to last week's visit. Overall I think that he is making good progress which is great news and there does not appear to be any evidence of active infection at this time. No fevers, chills, nausea, vomiting, or diarrhea. 09/30/2021 upon evaluation patient's heel is actually showing signs of good improvement which is great news. Fortunately there does not appear to be any evidence of active infection locally nor systemically at this point. No fevers, chills, nausea, vomiting, or diarrhea. 10/07/2021 upon evaluation today patient appears to be doing well currently in regard to his heel ulcer. He has been tolerating the dressing changes without complication. Fortunately I do not see any signs of active infection at this time which is great news. No fevers, chills, nausea, vomiting, or diarrhea. 12/27; wound is measuring smaller. We have been using silver alginate 10/28/2021 upon evaluation today patient appears to be doing well currently in regard to his heel ulcer. I am actually very pleased with where things  stand and I think he is making excellent progress. Fortunately I do not see any signs of active infection locally nor systemically at this point which is great news. Nonetheless I do believe that the patient would benefit from a new offloading shoe and has been using it Velcro is no longer functioning properly and he tells me that he almost tripped and fell because of that today. Obviously I do not want him following that would be very bad. 11/11/2021 upon evaluation today patient appears to be doing excellent in regard to his wound. In fact this appears to be completely healed based on what I see currently. I do not see any signs of anything open or draining and I did double check just by clearing away some of the callus around the edges of the wound to ensure that there was nothing still open or hiding underneath. Once this was cleared away it appeared that the patient was doing significantly better at this time which is great news and there was nothing actually open. Readmission: 06-19-2022 upon evaluation today patient appears to be doing well currently in regard to his heel ulcer all things considered. He unfortunately is having a bit of breakdown in general as far as the heel is concerned not nearly as bad as what we noted last time he was here in the clinic. The good news is I do think that this should hopefully heal much more rapidly than what we noted last time. His past medical history has not changed he is on Coumadin. 07-03-2022 upon evaluation today patient appears to be doing better in regard to his heel. We will start to see some improvement here which is good news. Fortunately I do not see any evidence of infection locally or systemically at this time which is great news. No fevers, chills, nausea, vomiting, or diarrhea. 07-10-2022 upon evaluation today patient appears to be doing well currently in regard to his wound from the callus  standpoint around the edges. Unfortunately from an actual  wound bed standpoint he has some deep tissue injury noted at this point. Again I am not sure exactly what is going on that is causing this pressure to the region but he is definitely gotten pressure that is occurring and causing this to not heal as effectively as what we would like to see. I discussed with him today that he may need to at night when he sleeping use a pillow up underneath his calf in order to prevent the heel from touching the bed at all. I also think that it may be beneficial for him to monitor throughout his day and make sure there is no other time when he is getting the pressure to the heel. 07-23-2022 upon evaluation today patient appears to be doing poorly currently in regard to his heel ulcer. He has been tolerating the dressing changes without complication. Unfortunately I feel like he may be infected based on what I am seeing. 07-28-2022 I did review patient's culture results which showed positive for Proteus as well as Staphylococcus both of which would be treated with the Bactrim DS that I gave him at the last visit. This is good news and hopefully means that we should be able to apply the cast today as long as the wound itself does not appear to be doing poorly. Patient's wound bed actually showed signs of doing quite well and I am very pleased with where things stand and I do not see any signs of worsening overall which is great news. 08-04-2022 upon evaluation today patient appears to be doing well currently in regard to his heel ulcer. I am actually very pleased with where things stand and I do think this is looking significantly better. With regard to his right shin that is also showing signs of improvement which is great news. 08-10-2022 upon evaluation today patient's wound on the heel actually appears to be doing significantly better. In regard to the right anterior shin this is showing signs of healing it might even be completely healed but I still get a monitor I  cannot get anything to fill away but also could not see where there was anything actually draining at this point. I am tending to think healed but I want a monitor 1 more week before I call it for sure. 08-17-2022 upon evaluation today patient appears to be doing well currently in regard to his heel. Fortunately he is tolerating the dressing changes without complication. I do not see any signs of infection and overall I think he is making good progress here. We did get approval for the Apligraf but I think he had a $295 co-pay that would have to be paid per application. With that being said as good as he is doing right now I do not think that is necessary but is still an option depending on how things progress if we need to speed this up quite a bit. 08/24/2022; this patient has a wound on his left heel in the setting of type 2 diabetes. We have been using Prisma. He has a heel offloading boot 08-31-2022 upon evaluation today patient appears to be doing poorly in regard to his heel ulcer which is still showing signs of bruising I am just not as happy as I was when I saw him 2 [name] ago with this. He saw Dr. Leanord Hawking last week. Also did not look good at that point apparently. Nonetheless I think that the patient is still continuing  to have some counterpressure getting to the wound bed unfortunately. 09-07-2022 upon evaluation today patient appears to be doing well currently in regard to his heel ulcer. He has been tolerating the dressing changes without complication. Fortunately there does not appear to be any signs of active infection locally or systemically at this time. Fortunately I do not see any evidence of active infection at this time. 09-15-2022 upon evaluation today patient appears to be doing well currently in regard to his legs which in fact appear to be completely healed this is great news. With that being said his heel does have some erythema and warmth as well as drainage I am concerned  about cellulitis at this location. 09-21-2022 upon evaluation today patient's wound is actually showing signs of being a little bit smaller but still we are not doing nearly as well as what I would like to see. Fortunately I do not see any signs of infection at this time which is great news and overall I am extremely pleased in that regard. With that being said I do think that he needs to continue to elevate his leg although the edema is under great control with a compression wrap I think switching to a different dressing topically may be beneficial. My suggestion is to switch him over to a Iodoflex dressing. [name], [name] ([id number]) [id number] Page 9 of 11 09-28-2022 upon evaluation today patient appears to be doing well with regard to his heel ulcer. This is actually showing signs of improvement which is great news and overall I am extremely pleased with where we stand today. 10-06-2022 upon evaluation today patient actually appears to be making excellent progress at this point. Fortunately there does not appear to be any signs of active infection locally nor systemically which is great news and overall I am extremely pleased with where we stand. I do feel like he is actually making some pretty good progress here as we switch to the wrap along with the Iodoflex. 12/26; patient has a wound on the tip of his left heel. This is not a weightbearing surface. He is using a heel offloading boot and carefully offloading this at other times during the day. He is using Iodoflex and Zetuvitunder 3 layer compression 10-22-2022 upon evaluation today patient appears to be doing well currently in regard to his wound. He has been tolerating the dressing changes without complication. Fortunately there does not appear to be any signs of active infection locally nor systemically at this time. No fevers, chills, nausea, vomiting, or diarrhea. 08-30-2023 upon evaluation today patient  appears to be doing well currently in regard to his heel ulcer which is showing signs of good improvement. Fortunately I do not see any evidence of infection locally nor systemically which is great news and overall I am extremely pleased with where we stand. No fevers, chills, nausea, vomiting, or diarrhea. Unfortunately he does have a wound on the side of his fifth toe where I think this did rub on the shoe. 11-05-2022 upon evaluation today patient still still showing some signs of pressure at this point and there may have even been some fluid collection at this time. Fortunately I do not see any evidence of active infection locally nor systemically which is great news and overall I am extremely pleased with where we stand. No fevers, chills, nausea, vomiting, or diarrhea. 1/25; the patient's area on the dorsal aspect of the left fifth toe is healed. We have been using Iodoflex to the area on the  left heel. The lateral wound is slightly down in length. 11-19-2022 upon evaluation today patient appears to be doing well currently in regard to his wound. He has been tolerating the dressing changes without complication. Fortunately there does not appear to be any signs of infection we been using Iodoflex up to this point. 11-26-2022 upon evaluation today patient appears to be doing poorly currently in regard to his wound. He has been tolerating the dressing changes with the Iodoflex without complication. With that being said he does not have any signs of active infection systemically though locally I do feel like there is some evidence of infection here. 12-03-2022 upon evaluation today patient appears to be doing well currently in regard to his wound this is better with the Bactrim compared to last week still there was some significant slough and biofilm buildup which is going require sharp debridement at this point. Fortunately I do not see any signs of active infection locally nor systemically which is great  news. 12-11-2022 upon evaluation today patient actually appears to be doing significantly better at this time in regard to his heel. I am actually very pleased with where things stand currently. There is no signs of active infection locally or systemically at this point. 12-17-2022 upon evaluation today patient appears to be doing better as far as the overall appearance of his wound. I am actually very pleased in that regard. Fortunately there does not appear to be any signs of active infection locally nor systemically at this time. I do feel like that the patient is showing signs of excellent improvement in general. Nonetheless he needs something better for compression I think he should go ahead and see about getting compression socks. 3/7; tip of the left heel measurements are smaller he has been using Hydrofera Blue 01-07-2023 upon evaluation today patient appears to be doing well currently in regard to his wound. This has not been debrided since I last saw him and it definitely shows that definitely needs some debridement today. Fortunately I do not see any evidence of active infection locally nor systemically which is great news. 01-14-2023 upon evaluation today patient appears to be doing well currently although he does have some erythema and warmth to the heel I feel like he may have developed an infection yet again. This is something that is been ongoing issue we have been trying to manage his foot best we could. With that being said I do think he may need to go back on the Bactrim this has done well for him in the past. Objective Constitutional Obese and well-hydrated in no acute distress. Vitals Time Taken: 12:52 PM, Height: 70 in, Weight: 265 lbs, BMI: 38, Temperature: 98.2 F, Pulse: 65 bpm, Respiratory Rate: 18 breaths/min, Blood Pressure: 165/67 mmHg. Respiratory normal breathing without difficulty. Psychiatric this patient is able to make decisions and demonstrates good insight into  disease process. Alert and Oriented x 3. pleasant and cooperative. General Notes: Upon inspection patient's wound bed actually showed signs of good granulation epithelization at this point. Fortunately I do not see any signs of active infection locally nor systemically which is great news and overall I am extremely pleased with where we stand. patient does seem to have some signs of cellulitis at this point. Fortunately he is not having any actual pain currently. Integumentary (Hair, Skin) Wound #10 status is Open. Original cause of wound was Gradually Appeared. The date acquired was: 06/05/2022. The wound has been in treatment 29 [name]. The wound is located  on the Left Calcaneus. The wound measures 1.2cm length x 2.6cm width x 0.1cm depth; 2.45cm^2 area and 0.245cm^3 volume. There is Fat Layer (Subcutaneous Tissue) exposed. There is a medium amount of serosanguineous drainage noted. The wound margin is flat and intact. There is small (1-33%) pink granulation within the wound bed. There is a large (67-100%) amount of necrotic tissue within the wound bed including Adherent Slough. 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 There was a Excisional Skin/Subcutaneous Tissue Debridement with a total area of 3.12 sq cm performed by [name]., PA-C. With the following instrument(s): Curette to remove Viable and Non-Viable tissue/material. Material removed includes Callus, Subcutaneous Tissue, Slough, and Biofilm. A time out was conducted at 13:12, prior to the  start of the procedure. A Minimum amount of bleeding was controlled with Pressure. The procedure was tolerated well. Post Debridement Measurements: 1.2cm length x 2.6cm width x 0.2cm depth; 0.49cm^3 volume. Character of Wound/Ulcer Post Debridement is stable. Severity of Tissue Post Debridement is: Fat layer exposed. Post procedure Diagnosis Wound #10: Same as Pre-Procedure Plan Follow-up Appointments: Wound #10 Left Calcaneus: Return Appointment in 1 week. Bathing/ Shower/ Hygiene: May shower; gently cleanse wound with antibacterial soap, rinse and pat dry prior to dressing wounds Non-Wound Condition: Additional non-wound orders/instructions: - Apply 40% Urea cream to dry cracked skin on feet. Off-Loading: Open toe surgical shoe with peg assist. Medications-Please add to medication list.: P.O. Antibiotics - Start Bactrim The following medication(s) was prescribed: Bactrim DS oral 800 mg-160 mg tablet 1 1 tablet oral twice a day x 14 days starting 01/14/2023 WOUND #10: - Calcaneus Wound Laterality: Left Topical: Gentamicin 3 x Per Week/30 Days Discharge Instructions: Apply as directed by provider. Prim Dressing: Hydrofera Blue Ready Transfer Foam, 2.5x2.5 (in/in) (Dispense As Written) 3 x Per Week/30 Days ary Discharge Instructions: Apply Hydrofera Blue Ready to wound bed as directed Secondary Dressing: ABD Pad 5x9 (in/in) (Generic) 3 x Per Week/30 Days Discharge Instructions: Cover with ABD pad Secured With: Kerlix Roll Sterile or Non-Sterile 6-ply 4.5x4 (yd/yd) 3 x Per Week/30 Days Discharge Instructions: Apply Kerlix as directed Secured With: Tubigrip Size D, 3x10 (in/yd) 3 x Per Week/30 Days 1. Would recommend currently based on what we are seeing that we go ahead and continue to monitor for any signs of infection or worsening. Based on what I am seeing I do believe that we are headed in the right direction with that being said he still continues to have erythema and warmth around the  wound I think this is likely infected. Therefore I am going to place him on Bactrim. 2. We are going to continue with the [facility] after applying the gentamicin followed by ABD pad and roll gauze to secure in place. He is using Tubigrip size D single-layer. We will see patient back for reevaluation in 1 week here in the clinic. If anything worsens or changes patient will contact our office for additional recommendations. Electronic Signature(s) Signed: 01/14/2023 1:21:00 PM By: [name] Kelp PA-C Previous Signature: 01/14/2023 1:19:37 PM Version By: [name] Kelp PA-C Entered By: [name] on 01/14/2023 13:20:59 -------------------------------------------------------------------------------- SuperBill  Details Patient Name: Date of Service: Ollen Gross 01/14/2023 Medical Record Number: [id number] Patient Account Number: [id number] Date of Birth/Sex: Treating RN: 09/10/1946 (77 y.o. [name] Primary Care Provider: [name] Other Clinician: Betha [name] Referring Provider: Treating Provider/Extender: [name] in Treatment: [address] [place] ([id number]) [id number] Page 11 of 11 Diagnosis Coding ICD-10 Codes Code Description E11.621 Type 2 diabetes mellitus with foot ulcer 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sed E11.42 Type 2 diabetes mellitus with diabetic polyneuropathy Facility Procedures : CPT4 Code: [id number] Description: 11042 - DEB SUBQ TISSUE 20 SQ CM/< ICD-10 Diagnosis Description 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sed Modifier: Quantity: 1 Physician Procedures : CPT4 Code Description Modifier [id number] 11042 - WC PHYS SUBQ TISS 20 SQ CM ICD-10 Diagnosis Description 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sed Quantity: 1 Electronic Signature(s) Signed:  01/14/2023 1:21:06 PM By: [name] Kelp PA-C Entered By: [name] on 01/14/2023 13:21:05

## 2023-01-14 NOTE — Progress Notes (Addendum)
TRISHA, CAMPE ([id number]) [id number] Page 1 of 7 Visit Report for 01/14/2023 Arrival Information Details Patient Name: Date of Service: [name], [name] [name] [name]. 01/14/2023 12:45 PM Medical Record Number: [id number] Patient Account Number: [id number] Date of Birth/Sex: Treating RN: 1946/01/07 (77 y.o. [name] Primary Care [name]: [name] Other Clinician: Betha [name] Referring [name]: Treating [name]/Extender: [name] in Treatment: 29 Visit Information History Since Last Visit All ordered tests and consults were completed: No Patient Arrived: [name] Added or deleted any medications: No Arrival Time: 12:50 Any new allergies or adverse reactions: No Transfer Assistance: None Had a fall or experienced change in No Patient Identification Verified: Yes activities of daily living that may affect Secondary Verification Process Completed: Yes risk of falls: Patient Requires Transmission-Based Precautions: No Signs or symptoms of abuse/neglect since No Patient Has Alerts: Yes last visito Patient Alerts: Patient on Blood Thinner Hospitalized since last visit: No Warfarin Implantable device outside of the clinic No Type II Diabetic excluding cellular tissue based products placed in the center since last visit: Has Dressing in Place as Prescribed: Yes Has Footwear/Offloading in Place as Yes Prescribed: Left: Surgical Shoe with Pressure Relief Insole Pain Present Now: Yes Electronic Signature(s) Signed: 01/14/2023 4:25:05 PM By: [name] Entered By: [name] on 01/14/2023 12:52:07 -------------------------------------------------------------------------------- Clinic Level of Care Assessment Details Patient Name: Date of Service: [name] [name] [name]. 01/14/2023 12:45 PM Medical Record Number: [id number] Patient Account Number: [id number] Date of Birth/Sex: Treating RN: 1946-08-04 (77 y.o. [name] Primary Care [name]: [name] Other Clinician: Betha [name] Referring [name]: Treating [name]/Extender: [name] in Treatment: 29 Clinic Level of Care Assessment Items TOOL 1 Quantity Score []  - 0 Use when EandM and Procedure is performed on INITIAL visit ASSESSMENTS - Nursing Assessment / Reassessment []  - 0 [name] Physical Exam (combine w/ comprehensive assessment (listed just below) when performed on new pt. evals) []  - 0 Comprehensive Assessment (HX, ROS, Risk Assessments, Wounds Hx, etc.) ASSESSMENTS - Wound and Skin Assessment / Reassessment []  - 0 Dermatologic / Skin Assessment (not related to wound area) ASSESSMENTS - Ostomy and/or Continence Assessment and Care []  - 0 Incontinence Assessment and Management []  - 0 Ostomy Care Assessment and Management (repouching, etc.) PROCESS - Coordination of Care []  - 0 Simple Patient / Family Education for ongoing care []  - 0 Complex (extensive) Patient / Family Education for ongoing care []  - 0 Staff obtains Chiropractor, Records, T Results / Process Orders est []  - 0 Staff telephones HHA, Nursing Homes / Clarify orders / etc DEMETERIUS, KREISMAN ([id number]) [id number] Page 2 of 7 []  - 0 Routine Transfer to another Facility (non-emergent condition) []  - 0 Routine [name] Admission (non-emergent condition) []  - 0 New Admissions / Manufacturing engineer / Ordering NPWT Apligraf, etc. , []  - 0 Emergency [name] Admission (emergent condition) PROCESS - Special Needs []  - 0 Pediatric / Minor Patient Management []  - 0 Isolation Patient Management []  - 0 Hearing / Language / Visual special needs []  - 0 Assessment of Community assistance (transportation, D/C planning, etc.) []  - 0 Additional assistance / Altered mentation []  - 0 Support Surface(s) Assessment (bed, cushion, seat, etc.) INTERVENTIONS - Miscellaneous []  - 0 External ear exam []  -  0 Patient Transfer (multiple staff / Nurse, adult / Similar devices) []  - 0 Simple Staple / Suture removal (25 or less) []  - 0 Complex Staple / Suture removal (26 or more) []  - 0 Hypo/Hyperglycemic  Management (do not check if billed separately) []  - 0 Ankle / Brachial Index (ABI) - do not check if billed separately Has the patient been seen at the [name] within the last three years: Yes Total Score: 0 Level Of Care: ____ Electronic Signature(s) Signed: 01/14/2023 4:25:05 PM By: [name] Entered By: [name] on 01/14/2023 13:17:11 -------------------------------------------------------------------------------- Encounter Discharge Information Details Patient Name: Date of Service: [name], [name] [name]. 01/14/2023 12:45 PM Medical Record Number: [id number] Patient Account Number: [id number] Date of Birth/Sex: Treating RN: [birth year] (77 y.o. [name] Primary Care [name]: [name] Other Clinician: Betha [name] Referring [name]: Treating [name]/Extender: [name] in Treatment: 29 Encounter Discharge Information Items Post Procedure Vitals Discharge Condition: Stable Temperature (F): 98.2 Ambulatory Status: Walker Pulse (bpm): 65 Discharge Destination: Home Respiratory Rate (breaths/min): 18 Transportation: Private Auto Blood Pressure (mmHg): 165/67 Accompanied By: self Schedule Follow-up Appointment: Yes Clinical Summary of Care: Electronic Signature(s) Signed: 01/14/2023 4:25:05 PM By: [name] Entered By: [name] on 01/14/2023 13:28:47 -------------------------------------------------------------------------------- Lower Extremity Assessment Details Patient Name: Date of Service: [name], [name] [name]. 01/14/2023 12:45 PM Medical Record Number: [id number] Patient Account Number: [id number] Date of Birth/Sex: Treating RN: 1945-11-03 (77 y.o. [name] Primary Care Hartley Urton: [name] Other Clinician:  Betha [name] Referring Mithcell Schumpert: Treating Tanda Morrissey/Extender: [name] ([id number]) [id number] Page 3 of 7 Weeks in Treatment: 29 Edema Assessment Assessed: [Left: Yes] [Right: No] Edema: [Left: Ye] [Right: s] Calf Left: Right: Point of Measurement: 33 cm From Medial Instep 39.8 cm Ankle Left: Right: Point of Measurement: 11 cm From Medial Instep 25.3 cm Vascular Assessment Pulses: Dorsalis Pedis Palpable: [Left:Yes] Electronic Signature(s) Signed: 01/14/2023 4:25:05 PM By: [name] Signed: 01/22/2023 9:18:45 AM By: Elliot Gurney, BSN, RN, CWS, [name] RN, BSN Entered By: [name] on 01/14/2023 13:02:23 -------------------------------------------------------------------------------- Multi Wound Chart Details Patient Name: Date of Service: [name], [name] [name]. 01/14/2023 12:45 PM Medical Record Number: [id number] Patient Account Number: [id number] Date of Birth/Sex: Treating RN: 10/03/1946 (77 y.o. [name] Primary Care Kolbi Tofte: [name] Other Clinician: Betha [name] Referring Devlin Brink: Treating Inge Waldroup/Extender: Hermine Messick Weeks in Treatment: 29 Vital Signs Height(in): 70 Pulse(bpm): 65 Weight(lbs): 265 Blood Pressure(mmHg): 165/67 Body Mass Index(BMI): 38 Temperature(F): 98.2 Respiratory Rate(breaths/min): 18 [10:Photos:] [N/A:N/A] Left Calcaneus N/A N/A Wound Location: Gradually Appeared N/A N/A Wounding 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 N/A N/A Secondary Etiology: Cataracts, Arrhythmia, Coronary N/A N/A Comorbid History: Artery Disease, Hypertension, Type II Diabetes, Osteoarthritis, Neuropathy 06/05/2022 N/A N/A Date Acquired: 64 N/A N/A Weeks of Treatment: Open N/A N/A Wound Status: No N/A N/A Wound Recurrence: 1.2x2.6x0.1 N/A N/A Measurements L x W x D (cm) 2.45 N/A N/A A (cm) : rea 0.245 N/A  N/A Volume (cm) : -73.30% N/A N/A % Reduction in Area: 13.40% N/A N/A % Reduction in Volume: Grade 1 N/A N/A Classification: [name], [name] ([id number]) [id number] Page 4 of 7 Medium N/A N/A Exudate Amount: Serosanguineous N/A N/A Exudate Type: red, brown N/A N/A Exudate Color: Flat and Intact N/A N/A Wound Margin: Small (1-33%) N/A N/A Granulation Amount: Pink N/A N/A Granulation Quality: Large (67-100%) N/A N/A Necrotic Amount: Fat Layer (Subcutaneous Tissue): Yes N/A N/A Exposed Structures: Fascia: No Tendon: No Muscle: No Joint: No Bone: No Small (1-33%) N/A N/A Epithelialization: Treatment Notes Electronic Signature(s) Signed: 01/14/2023 4:25:05 PM By: [name] Entered By: [name] on 01/14/2023 13:03:07 -------------------------------------------------------------------------------- Multi-Disciplinary Care Plan  Details Patient Name: Date of Service: [name] 01/14/2023 12:45 PM Medical Record Number: [id number] Patient Account Number: [id number] Date of Birth/Sex: Treating RN: 1945-12-21 (77 y.o. Loel LoftyM) Woody, Selena BattenKim Primary Care Tjay Velazquez: [name] BeechamSparks, Jeffrey Other Clinician: Betha LoaVenable, Angie Referring Jorene Kaylor: Treating Anshu Wehner/Extender: [name] EaringStone, Hoyt Sparks, Jeffrey Weeks in Treatment: 29 Active Inactive Pressure Nursing Diagnoses: Knowledge deficit related to causes and risk factors for pressure ulcer development Knowledge deficit related to management of pressures ulcers Potential for impaired tissue integrity related to pressure, friction, moisture, and shear Goals: Patient will remain free from development of additional pressure ulcers Date Initiated: 06/19/2022 Date Inactivated: 07/28/2022 Target Resolution Date: 06/19/2022 Goal Status: Met Patient/caregiver will verbalize risk factors for pressure ulcer development Date Initiated: 06/19/2022 Date Inactivated: 07/28/2022 Target Resolution Date: 06/19/2022 Goal  Status: Met Patient/caregiver will verbalize understanding of pressure ulcer management Date Initiated: 06/19/2022 Target Resolution Date: 08/14/2022 Goal Status: Active Interventions: Assess: immobility, friction, shearing, incontinence upon admission and as needed Assess offloading mechanisms upon admission and as needed Assess potential for pressure ulcer upon admission and as needed Provide education on pressure ulcers Notes: Wound/Skin Impairment Nursing Diagnoses: Impaired tissue integrity Goals: Patient/caregiver will verbalize understanding of skin care regimen Date Initiated: 06/19/2022 Date Inactivated: 07/10/2022 Target Resolution Date: 06/19/2022 Goal Status: Met Ulcer/skin breakdown will have a volume reduction of 30% by week 4 Date Initiated: 06/19/2022 Date Inactivated: 07/28/2022 Target Resolution Date: 07/17/2022 Goal Status: Unmet Unmet Reason: infection Ulcer/skin breakdown will have a volume reduction of 50% by week 8 Barrie FolkFORSYTH, [name] ([id number]) [id number] Page 5 of 7 Date Initiated: 07/28/2022 Target Resolution Date: 08/14/2022 Goal Status: Active Interventions: Assess patient/caregiver ability to obtain necessary supplies Assess patient/caregiver ability to perform ulcer/skin care regimen upon admission and as needed Assess ulceration(s) every visit Provide education on ulcer and skin care Treatment Activities: Skin care regimen initiated : 06/19/2022 Topical wound management initiated : 06/19/2022 Notes: Electronic Signature(s) Signed: 01/14/2023 4:25:05 PM By: [name] LoaVenable, Angie Signed: 01/22/2023 9:18:45 AM By: Elliot GurneyWoody, BSN, RN, CWS, [name] RN, BSN Entered By: [name] LoaVenable, Angie on 01/14/2023 13:26:31 -------------------------------------------------------------------------------- Pain Assessment Details Patient Name: Date of Service: [name] BanningFO [name], [name] [name]. 01/14/2023 12:45 PM Medical Record Number: [id number] Patient Account Number:  [id number] Date of Birth/Sex: Treating RN: 1945-12-21 (77 y.o. [name] HolmsM) Woody, [name] Primary Care Shanyn Preisler: [name] BeechamSparks, Jeffrey Other Clinician: Betha LoaVenable, Angie Referring Langley Ingalls: Treating Auther Lyerly/Extender: Hermine MessickStone, Hoyt Sparks, Jeffrey Weeks in Treatment: 29 Active Problems Location of Pain Severity and Description of Pain Patient Has Paino No Site Locations Pain Management and Medication Current Pain Management: Electronic Signature(s) Signed: 01/14/2023 4:25:05 PM By: [name] LoaVenable, Angie Signed: 01/22/2023 9:18:45 AM By: Elliot GurneyWoody, BSN, RN, CWS, [name] RN, BSN Entered By: [name] LoaVenable, Angie on 01/14/2023 12:54:59 -------------------------------------------------------------------------------- Patient/Caregiver Education Details Patient Name: Date of Service: [name] BanningFO [name], [name] [name]. 3/28/2024andnbsp12:45 PM Medical Record Number: [id number] Patient Account Number: [id number] Date of Birth/Gender: Treating RN: 1945-12-21 (77 y.o. [name] HolmsM) Woody, [name] Primary Care Physician: [name] BeechamSparks, Jeffrey Other Clinician: Betha LoaVenable, Angie Referring Physician: Treating Physician/Extender: Mignon PineStone, Hoyt Sparks, Jeffrey Montefusco, Darrold [name] ([id number]) [id number] Page 6 of 7 Weeks in Treatment: 29 Education Assessment Education Provided To: Patient Education Topics Provided Infection: Handouts: CDC antimicrobial patient education_English, Other: take probiotics Methods: Explain/Verbal Responses: State content correctly Electronic Signature(s) Signed: 01/14/2023 4:25:05 PM By: [name] LoaVenable, Angie Entered By: [name] LoaVenable, Angie on 01/14/2023 13:27:07 -------------------------------------------------------------------------------- Wound Assessment Details Patient Name: Date of Service: [name] BanningFO [name], [name] [name]. 01/14/2023 12:45 PM Medical Record Number: [id number] Patient Account Number: [id number] Date of Birth/Sex: Treating RN: 1945-12-21 (76  y.o. [name]) Elliot Gurney, [name] Primary Care Jentri Aye: [name] Other Clinician: Betha [name] Referring [name]: Treating Elenora Hawbaker/Extender: Hermine Messick Weeks in Treatment: 29 Wound Status Wound Number: 10 Primary Diabetic Wound/Ulcer of the Lower Extremity Etiology: Wound Location: Left Calcaneus Secondary Pressure Ulcer Wounding Event: Gradually Appeared Etiology: Date Acquired: 06/05/2022 Wound Open Weeks Of Treatment: 29 Status: Clustered Wound: No Comorbid Cataracts, Arrhythmia, Coronary Artery Disease, Hypertension, History: Type II Diabetes, Osteoarthritis, Neuropathy Photos Wound Measurements Length: (cm) 1.2 Width: (cm) 2.6 Depth: (cm) 0.1 Area: (cm) 2.45 Volume: (cm) 0.245 % Reduction in Area: -73.3% % Reduction in Volume: 13.4% Epithelialization: Small (1-33%) Wound Description Classification: Grade 1 Wound Margin: Flat and Intact Exudate Amount: Medium Exudate Type: Serosanguineous Exudate Color: red, brown Foul Odor After Cleansing: No Slough/Fibrino Yes Wound Bed Granulation Amount: Small (1-33%) Exposed Structure Granulation Quality: Pink Fascia Exposed: No Necrotic Amount: Large (67-100%) Fat Layer (Subcutaneous Tissue) ExposedValentino Hue [name], [name] ([id number]) [id number] Page 7 of 7 Necrotic Quality: Adherent Slough Tendon Exposed: No Muscle Exposed: No Joint Exposed: No Bone Exposed: No Electronic Signature(s) Signed: 01/14/2023 4:25:05 PM By: [name] Signed: 01/22/2023 9:18:45 AM By: Elliot Gurney, BSN, RN, CWS, [name] RN, BSN Entered By: [name] on 01/14/2023 13:00:54 -------------------------------------------------------------------------------- Vitals Details Patient Name: Date of Service: [name], [name] [name]. 01/14/2023 12:45 PM Medical Record Number: [id number] Patient Account Number: [id number] Date of Birth/Sex: Treating RN: Feb 14, 1946 (77 y.o. [name], [name] Primary Care Ashika Apuzzo: [name] Other Clinician: Betha [name] Referring Avry Roedl: Treating  Annaliyah Willig/Extender: Hermine Messick Weeks in Treatment: 29 Vital Signs Time Taken: 12:52 Temperature (F): 98.2 Height (in): 70 Pulse (bpm): 65 Weight (lbs): 265 Respiratory Rate (breaths/min): 18 Body Mass Index (BMI): 38 Blood Pressure (mmHg): 165/67 Reference Range: 80 - 120 mg / dl Electronic Signature(s) Signed: 01/14/2023 4:25:05 PM By: [name] Entered By: [name] on 01/14/2023 12:54:55

## 2023-01-21 ENCOUNTER — Encounter: Payer: Medicare HMO | Attending: Physician Assistant | Admitting: Physician Assistant

## 2023-01-21 DIAGNOSIS — I872 Venous insufficiency (chronic) (peripheral): Secondary | ICD-10-CM | POA: Diagnosis not present

## 2023-01-21 DIAGNOSIS — I10 Essential (primary) hypertension: Secondary | ICD-10-CM | POA: Diagnosis not present

## 2023-01-21 DIAGNOSIS — I480 Paroxysmal atrial fibrillation: Secondary | ICD-10-CM | POA: Insufficient documentation

## 2023-01-21 DIAGNOSIS — E1151 Type 2 diabetes mellitus with diabetic peripheral angiopathy without gangrene: Secondary | ICD-10-CM | POA: Diagnosis not present

## 2023-01-21 DIAGNOSIS — G8929 Other chronic pain: Secondary | ICD-10-CM | POA: Diagnosis not present

## 2023-01-21 DIAGNOSIS — L97522 Non-pressure chronic ulcer of other part of left foot with fat layer exposed: Secondary | ICD-10-CM | POA: Insufficient documentation

## 2023-01-21 DIAGNOSIS — M1990 Unspecified osteoarthritis, unspecified site: Secondary | ICD-10-CM | POA: Insufficient documentation

## 2023-01-21 DIAGNOSIS — Z7901 Long term (current) use of anticoagulants: Secondary | ICD-10-CM | POA: Insufficient documentation

## 2023-01-21 DIAGNOSIS — E1142 Type 2 diabetes mellitus with diabetic polyneuropathy: Secondary | ICD-10-CM | POA: Diagnosis not present

## 2023-01-21 DIAGNOSIS — Z6838 Body mass index (BMI) 38.0-38.9, adult: Secondary | ICD-10-CM | POA: Diagnosis not present

## 2023-01-21 DIAGNOSIS — Z79899 Other long term (current) drug therapy: Secondary | ICD-10-CM | POA: Diagnosis not present

## 2023-01-21 DIAGNOSIS — Z952 Presence of prosthetic heart valve: Secondary | ICD-10-CM | POA: Insufficient documentation

## 2023-01-21 DIAGNOSIS — E11621 Type 2 diabetes mellitus with foot ulcer: Secondary | ICD-10-CM | POA: Diagnosis not present

## 2023-01-21 DIAGNOSIS — E669 Obesity, unspecified: Secondary | ICD-10-CM | POA: Diagnosis not present

## 2023-01-21 NOTE — Progress Notes (Addendum)
DERVON, EBERSOLE ([id number]) [id number] Page 1 of 7 Visit Report for 01/21/2023 Arrival Information Details Patient Name: Date of Service: [name], GEO [facility] [place] J. 01/21/2023 1:15 PM Medical Record Number: [id number] Patient Account Number: [id number] Date of Birth/Sex: Treating RN: February 01, 1946 (77 y.o. [name] Primary Care Noboru Bidinger: [name] Other Clinician: Referring Bary Limbach: Treating Glenice Ciccone/Extender: [name] in Treatment: 30 Visit Information History Since Last Visit Added or deleted any medications: No Patient Arrived: Walker Has Dressing in Place as Prescribed: Yes Arrival Time: 13:40 Pain Present Now: No Accompanied By: self Transfer Assistance: None Patient Identification Verified: Yes Secondary Verification Process Completed: Yes Patient Requires Transmission-Based Precautions: No Patient Has Alerts: Yes Patient Alerts: Patient on Blood Thinner Warfarin Type II Diabetic Electronic Signature(s) Signed: 01/21/2023 4:51:01 PM By: Midge Aver MSN RN CNS WTA Entered By: Midge Aver on 01/21/2023 13:40:42 -------------------------------------------------------------------------------- Clinic Level of Care Assessment Details Patient Name: Date of Service: [name], [name] 01/21/2023 1:15 PM Medical Record Number: [id number] Patient Account Number: [id number] Date of Birth/Sex: Treating RN: 16-May-1946 (77 y.o. [name] Primary Care Digby Groeneveld: [name] Other Clinician: Referring Kirstyn Lean: Treating Theador Jezewski/Extender: [name] in Treatment: 30 Clinic Level of Care Assessment Items TOOL 1 Quantity Score []  - 0 Use when EandM and Procedure is performed on INITIAL visit ASSESSMENTS - Nursing Assessment / Reassessment []  - 0 General Physical Exam (combine w/ comprehensive assessment (listed just below) when performed on new pt. evals) []  - 0 Comprehensive Assessment (HX, ROS,  Risk Assessments, Wounds Hx, etc.) ASSESSMENTS - Wound and Skin Assessment / Reassessment []  - 0 Dermatologic / Skin Assessment (not related to wound area) ASSESSMENTS - Ostomy and/or Continence Assessment and Care []  - 0 Incontinence Assessment and Management []  - 0 Ostomy Care Assessment and Management (repouching, etc.) PROCESS - Coordination of Care []  - 0 Simple Patient / Family Education for ongoing care []  - 0 Complex (extensive) Patient / Family Education for ongoing care []  - 0 Staff obtains Chiropractor, Records, T Results / Process Orders est []  - 0 Staff telephones HHA, Nursing Homes / Clarify orders / etc []  - 0 Routine Transfer to another Facility (non-emergent condition) []  - 0 Routine Hospital Admission (non-emergent condition) []  - 0 New Admissions / Insurance Authorizations / Ordering NPWT Apligraf, etc. , []  - 0 Emergency Hospital Admission (emergent condition) SHELDON, LANGSETH ([id number]) [phone] Page 2 of 7 PROCESS - Special Needs []  - 0 Pediatric / Minor Patient Management []  - 0 Isolation Patient Management []  - 0 Hearing / Language / Visual special needs []  - 0 Assessment of Community assistance (transportation, D/C planning, etc.) []  - 0 Additional assistance / Altered mentation []  - 0 Support Surface(s) Assessment (bed, cushion, seat, etc.) INTERVENTIONS - Miscellaneous []  - 0 External ear exam []  - 0 Patient Transfer (multiple staff / Nurse, adult / Similar devices) []  - 0 Simple Staple / Suture removal (25 or less) []  - 0 Complex Staple / Suture removal (26 or more) []  - 0 Hypo/Hyperglycemic Management (do not check if billed separately) []  - 0 Ankle / Brachial Index (ABI) - do not check if billed separately Has the patient been seen at the hospital within the last three years: Yes Total Score: 0 Level Of Care: ____ Electronic Signature(s) Signed: 01/21/2023 4:51:01 PM By: Midge Aver MSN RN CNS  WTA Entered By: Midge Aver on 01/21/2023 14:09:57 -------------------------------------------------------------------------------- Encounter Discharge Information Details Patient Name: Date of Service: FO RSYTH, GEO [name]  J. 01/21/2023 1:15 PM Medical Record Number: [id number] Patient Account Number: [id number] Date of Birth/Sex: Treating RN: 10-31-1945 (77 y.o. [name] Primary Care Brandan Glauber: [name] Other Clinician: Referring Tanya Crothers: Treating Nickey Canedo/Extender: [name] in Treatment: 30 Encounter Discharge Information Items Post Procedure Vitals Discharge Condition: Stable Temperature (F): 98.4 Ambulatory Status: Walker Pulse (bpm): 68 Discharge Destination: Home Respiratory Rate (breaths/min): 16 Transportation: Private Auto Blood Pressure (mmHg): 136/71 Accompanied By: self Schedule Follow-up Appointment: Yes Clinical Summary of Care: Electronic Signature(s) Signed: 01/21/2023 4:51:01 PM By: Midge Aver MSN RN CNS WTA Entered By: Midge Aver on 01/21/2023 14:12:49 -------------------------------------------------------------------------------- Lower Extremity Assessment Details Patient Name: Date of Service: [name], [name] 01/21/2023 1:15 PM Medical Record Number: [id number] Patient Account Number: [id number] Date of Birth/Sex: Treating RN: 1946-04-05 (77 y.o. [name] Primary Care Rielly Corlett: [name] Other Clinician: Referring Eve Rey: Treating Jeffifer Rabold/Extender: Hermine Messick Weeks in Treatment: 30 Edema Assessment Assessed: Kyra Searles: Yes] Franne Forts: No] [Left: Edema] Franne Forts: :] F[LeftVallarie Mare ([id number])] [Right: [id number] Page 3 of 7] Calf Left: Right: Point of Measurement: 33 cm From Medial Instep 38.5 cm Ankle Left: Right: Point of Measurement: 11 cm From Medial Instep 23.4 cm Vascular Assessment Pulses: Dorsalis Pedis Palpable: [Left:Yes] Electronic  Signature(s) Signed: 01/21/2023 4:51:01 PM By: Midge Aver MSN RN CNS WTA Entered By: Midge Aver on 01/21/2023 13:50:10 -------------------------------------------------------------------------------- Multi Wound Chart Details Patient Name: Date of Service: [name], [name] 01/21/2023 1:15 PM Medical Record Number: [id number] Patient Account Number: [id number] Date of Birth/Sex: Treating RN: 05-17-1946 (77 y.o. [name] Primary Care Jordin Vicencio: [name] Other Clinician: Referring Sherard Sutch: Treating Zymire Turnbo/Extender: Hermine Messick Weeks in Treatment: 30 Vital Signs Height(in): 70 Pulse(bpm): 68 Weight(lbs): 265 Blood Pressure(mmHg): 136/71 Body Mass Index(BMI): 38 Temperature(F): 98.4 Respiratory Rate(breaths/min): 16 [10:Photos:] [N/A:N/A] Left Calcaneus N/A N/A Wound Location: Gradually Appeared N/A N/A Wounding 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 N/A N/A Secondary Etiology: Cataracts, Arrhythmia, Coronary N/A N/A Comorbid History: Artery Disease, Hypertension, Type II Diabetes, Osteoarthritis, Neuropathy 06/05/2022 N/A N/A Date Acquired: 30 N/A N/A Weeks of Treatment: Open N/A N/A Wound Status: No N/A N/A Wound Recurrence: 1x1.8x0.12 N/A N/A Measurements L x W x D (cm) 1.414 N/A N/A A (cm) : rea 0.17 N/A N/A Volume (cm) : 0.00% N/A N/A % Reduction in A rea: 39.90% N/A N/A % Reduction in Volume: Grade 1 N/A N/A Classification: Medium N/A N/A Exudate A mount: Serosanguineous N/A N/A Exudate Type: red, brown N/A N/A Exudate Color: Flat and Intact N/A N/A Wound Margin: Small (1-33%) N/A N/A Granulation A mount: Pink N/A N/A Granulation Quality: Large (67-100%) N/A N/A Necrotic A mount: Fat Layer (Subcutaneous Tissue): Yes N/A N/A Exposed Structures: Fascia: No CHIKEZIE, WITTMER ([id number]) [phone].pdf Page 4 of 7 Tendon: No Muscle:  No Joint: No Bone: No Small (1-33%) N/A N/A Epithelialization: Treatment Notes Electronic Signature(s) Signed: 01/21/2023 4:51:01 PM By: Midge Aver MSN RN CNS WTA Entered By: Midge Aver on 01/21/2023 13:53:00 -------------------------------------------------------------------------------- Multi-Disciplinary Care Plan Details Patient Name: Date of Service: [name], [name] 01/21/2023 1:15 PM Medical Record Number: [id number] Patient Account Number: [id number] Date of Birth/Sex: Treating RN: 09/05/1946 (77 y.o. [name] Primary Care Gentle Hoge: [name] Other Clinician: Referring Jarreau Callanan: Treating Leanord Thibeau/Extender: Hermine Messick Weeks in Treatment: 30 Active Inactive Wound/Skin Impairment Nursing Diagnoses: Impaired tissue integrity Goals: Patient/caregiver will verbalize understanding of skin care regimen Date Initiated: 06/19/2022 Date  Inactivated: 07/10/2022 Target Resolution Date: 06/19/2022 Goal Status: Met Ulcer/skin breakdown will have a volume reduction of 30% by week 4 Date Initiated: 06/19/2022 Date Inactivated: 07/28/2022 Target Resolution Date: 07/17/2022 Goal Status: Met Ulcer/skin breakdown will have a volume reduction of 50% by week 8 Date Initiated: 07/28/2022 Target Resolution Date: 02/17/2023 Goal Status: Active Interventions: Assess patient/caregiver ability to obtain necessary supplies Assess patient/caregiver ability to perform ulcer/skin care regimen upon admission and as needed Assess ulceration(s) every visit Provide education on ulcer and skin care Treatment Activities: Skin care regimen initiated : 06/19/2022 Topical wound management initiated : 06/19/2022 Notes: Electronic Signature(s) Signed: 01/21/2023 4:51:01 PM By: Midge Aver MSN RN CNS WTA Entered By: Midge Aver on 01/21/2023 14:10:45 -------------------------------------------------------------------------------- Pain Assessment Details Patient Name: Date of  Service: [name], [name] 01/21/2023 1:15 PM Medical Record Number: [id number] Patient Account Number: [id number] Date of Birth/Sex: Treating RN: October 11, 1946 (77 y.o. [name] Primary Care Kenlyn Lose: [name] Other Clinician: Referring Romario Tith: Treating Gayatri Teasdale/Extender: [name] in Treatment: [address] ULYSES, PANICO ([id number]) [id number] Page 5 of 7 Location of Pain Severity and Description of Pain Patient Has Paino No Site Locations Pain Management and Medication Current Pain Management: Electronic Signature(s) Signed: 01/21/2023 4:51:01 PM By: Midge Aver MSN RN CNS WTA Entered By: Midge Aver on 01/21/2023 13:43:08 -------------------------------------------------------------------------------- Patient/Caregiver Education Details Patient Name: Date of Service: [name], GEO Cherrie Gauze 4/4/2024andnbsp1:15 PM Medical Record Number: [id number] Patient Account Number: [id number] Date of Birth/Gender: Treating RN: [birth year] (77 y.o. [name] Primary Care Physician: [name] Other Clinician: Referring Physician: Treating Physician/Extender: [name] in Treatment: 30 Education Assessment Education Provided To: Patient Education Topics Provided Wound Debridement: Handouts: Wound Debridement Methods: Explain/Verbal Responses: State content correctly Wound/Skin Impairment: Handouts: Caring for Your Ulcer Methods: Explain/Verbal Responses: State content correctly Electronic Signature(s) Signed: 01/21/2023 4:51:01 PM By: Midge Aver MSN RN CNS WTA Entered By: Midge Aver on 01/21/2023 14:11:15 -------------------------------------------------------------------------------- Wound Assessment Details Patient Name: Date of Service: [name], [name] 01/21/2023 1:15 PM Medical Record Number: [id number] Patient Account Number: [id number] Date of Birth/Sex: Treating  RN: 1946/01/27 (77 y.o. [name] Primary Care Elion Hocker: [name] Other Clinician: Referring Kaylei Frink: Treating Julya Alioto/Extender: Thien, Berka ([id number]) [id number] Page 6 of 7 Weeks in Treatment: 30 Wound Status Wound Number: 10 Primary Diabetic Wound/Ulcer of the Lower Extremity Etiology: Wound Location: Left Calcaneus Secondary Pressure Ulcer Wounding Event: Gradually Appeared Etiology: Date Acquired: 06/05/2022 Wound Open Weeks Of Treatment: 30 Status: Clustered Wound: No Comorbid Cataracts, Arrhythmia, Coronary Artery Disease, Hypertension, History: Type II Diabetes, Osteoarthritis, Neuropathy Photos Wound Measurements Length: (cm) 1 Width: (cm) 1.8 Depth: (cm) 0.12 Area: (cm) 1.414 Volume: (cm) 0.17 % Reduction in Area: 0% % Reduction in Volume: 39.9% Epithelialization: Small (1-33%) Wound Description Classification: Grade 1 Wound Margin: Flat and Intact Exudate Amount: Medium Exudate Type: Serosanguineous Exudate Color: red, brown Foul Odor After Cleansing: No Slough/Fibrino Yes Wound Bed Granulation Amount: Small (1-33%) Exposed Structure Granulation Quality: Pink Fascia Exposed: No Necrotic Amount: Large (67-100%) Fat Layer (Subcutaneous Tissue) Exposed: Yes Necrotic Quality: Adherent Slough Tendon Exposed: No Muscle Exposed: No Joint Exposed: No Bone Exposed: No Treatment Notes Wound #10 (Calcaneus) Wound Laterality: Left Cleanser Peri-Wound Care Topical Gentamicin Discharge Instruction: Apply as directed by Chalonda Schlatter. Primary Dressing Hydrofera Blue Ready Transfer Foam, 2.5x2.5 (in/in) Discharge Instruction: Apply Hydrofera Blue Ready to wound bed as directed Secondary Dressing ABD Pad 5x9 (in/in) Discharge Instruction:  Cover with ABD pad Secured With [employer] Sterile or Non-Sterile 6-ply 4.5x4 (yd/yd) Discharge Instruction: Apply Kerlix as directed Tubigrip Size  D, 3x10 (in/yd) Compression [address] FRITZ, CAUTHON ([id number]) [id number] Page 7 of 7 Compression Stockings Add-Ons Electronic Signature(s) Signed: 01/21/2023 4:51:01 PM By: Midge Aver MSN RN CNS WTA Entered By: Midge Aver on 01/21/2023 13:47:15 -------------------------------------------------------------------------------- Vitals Details Patient Name: Date of Service: [name], [name] 01/21/2023 1:15 PM Medical Record Number: [id number] Patient Account Number: [id number] Date of Birth/Sex: Treating RN: May 09, 1946 (77 y.o. [name] Primary Care Turquoise Esch: [name] Other Clinician: Referring Trevar Boehringer: Treating Billy Rocco/Extender: Hermine Messick Weeks in Treatment: 30 Vital Signs Time Taken: 13:42 Temperature (F): 98.4 Height (in): 70 Pulse (bpm): 68 Weight (lbs): 265 Respiratory Rate (breaths/min): 16 Body Mass Index (BMI): 38 Blood Pressure (mmHg): 136/71 Reference Range: 80 - 120 mg / dl Electronic Signature(s) Signed: 01/21/2023 4:51:01 PM By: Midge Aver MSN RN CNS WTA Entered By: Midge Aver on 01/21/2023 13:42:58

## 2023-01-21 NOTE — Progress Notes (Addendum)
CARRSON, LIGHTCAP ([id number]) [id number] Page 1 of 11 Visit Report for 01/21/2023 Chief Complaint Document Details Patient Name: Date of Service: [name], GEO [facility] J. 01/21/2023 1:15 PM Medical Record Number: [id number] Patient Account Number: [id number] Date of Birth/Sex: Treating RN: [birth year] (77 y.o. [name] Primary Care Provider: [name] Other Clinician: Referring Provider: Treating Provider/Extender: Hermine Messick Weeks in Treatment: 30 Information Obtained from: Patient Chief Complaint Left heel ulcer Electronic Signature(s) Signed: 01/21/2023 4:51:01 PM By: Midge Aver MSN RN CNS WTA Signed: 01/22/2023 8:47:01 AM By: Lenda Kelp PA-C Previous Signature: 01/21/2023 1:38:36 PM Version By: Lenda Kelp PA-C Entered By: Midge Aver on 01/21/2023 14:11:46 -------------------------------------------------------------------------------- Debridement Details Patient Name: Date of Service: [name], [name] 01/21/2023 1:15 PM Medical Record Number: [id number] Patient Account Number: [id number] Date of Birth/Sex: Treating RN: 11-14-1945 (77 y.o. [name] Primary Care Provider: [name] Other Clinician: Referring Provider: Treating Provider/Extender: Hermine Messick Weeks in Treatment: 30 Debridement Performed for Assessment: Wound #10 Left Calcaneus Performed By: Physician Nelida Meuse., PA-C Debridement Type: Debridement Severity of Tissue Pre Debridement: Fat layer exposed Level of Consciousness (Pre-procedure): Awake and Alert Pre-procedure Verification/Time Out Yes - 14:07 Taken: Start Time: 14:07 T Area Debrided (L x W): otal 1 (cm) x 1.8 (cm) = 1.8 (cm) Tissue and other material debrided: Viable, Non-Viable, Slough, Subcutaneous, Slough Level: Skin/Subcutaneous Tissue Debridement Description: Excisional Instrument: Curette Bleeding: Minimum Hemostasis Achieved: Pressure Response to  Treatment: Procedure was tolerated well Level of Consciousness (Post- Awake and Alert procedure): Post Debridement Measurements of Total Wound Length: (cm) 1 Width: (cm) 1.8 Depth: (cm) 0.1 Volume: (cm) 0.141 Character of Wound/Ulcer Post Debridement: Stable Severity of Tissue Post Debridement: Fat layer exposed Post Procedure Diagnosis Same as Pre-procedure Electronic Signature(s) Signed: 01/21/2023 4:51:01 PM By: Midge Aver MSN RN CNS WTA Signed: 01/22/2023 8:47:01 AM By: Lenda Kelp PA-C Entered By: Midge Aver on 01/21/2023 14:09:07 Barrie Folk ([id number]) [id number] Page 2 of 11 -------------------------------------------------------------------------------- HPI Details Patient Name: Date of Service: [name], [name] 01/21/2023 1:15 PM Medical Record Number: [id number] Patient Account Number: [id number] Date of Birth/Sex: Treating RN: October 01, 1946 (77 y.o. [name] Primary Care Provider: [name] Other Clinician: Referring Provider: Treating Provider/Extender: Hermine Messick Weeks in Treatment: 30 History of Present Illness HPI Description: 09/24/2020 on evaluation today patient presents today for a heel ulcer that he tells me has been present for about 2 years. He has been seeing podiatry and they have been attempting to manage this including what sounds to be a total contact cast, Unna boot, and just standard dressings otherwise as well. Most recently has been using triple antibiotic ointment. With that being said unfortunately despite everything he really has not had any significant improvement. He tells me that he cannot even really remember exactly how this began but he presumed it may have rubbed on his shoes or something of that nature. 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 He tells me that this does seem to help. Fortunately there is no signs of active infection at this time. His most recent hemoglobin A1c was 8.1 though he knows this was this year he  cannot tell me the exact time. His fluid pills currently to help with some of the lower extremity edema although he does obviously have signs of venous stasis/lymphedema. Currently there is no evidence of active infection. No fevers, chills, nausea, vomiting, or diarrhea. Patient has had fairly recent ABIs which were performed on 07/19/2020 and revealed that he has normal findings in both the ankle and toe locations bilaterally. His ABI on the right was 1.09 on the left was 1.08 with a TBI on the right of 0.88 and on the left of 0.94. Triphasic flow was noted throughout. 10/08/2020 on evaluation today patient appears to be doing pretty well in regard to his left heel currently in fact this is doing a great job and seems to be healing quite nicely. Unfortunately on his right leg he had a pile of wood that actually fell on him injuring his right leg this is somewhat erythematous has me concerned little bit about cellulitis though there is not really a good area to culture at this point. 10/24/2020 upon evaluation today patient appears to be doing well with regard to his heel ulcer. He is showing signs of improvement which is great news. His right leg is completely healed. Overall I feel like he is doing excellent and there is no signs of infection. 11/07/2020 upon evaluation today patient appears to be doing well with regard to his heel ulcer. He tells me that last week when he was unable to come in his wife actually thought that the wound was very close to closing if not closed. Then it began to "reopen again". I really feel like what may have happened as the collagen may  have dried over the wound bed and that because that misunderstanding with thinking that the wound was healing. With that being said I did not see it last week I do not know that for certain. Either way I feel like he is doing great today I see no signs of infection at this point. 11/26/2020 upon inspection today patient appears to be doing decently well in regard to his heel ulcer. He has been tolerating the dressing changes without complication. Fortunately there is no sign of active infection at this time. No fevers, chills, nausea, vomiting, or diarrhea. 12/10/2020 upon evaluation today patient appears to be doing fairly well in regard to the wound on his heel as well as what appears to be a new wound of the left first metatarsal head plantar aspect. This seems to be an area that was callus that has split as the patient tells me has been trying to walk on his toes more has probably where this came from. With that being said there does not appear to be signs of active infection which is great news. 12/17/2020 upon evaluation today patient actually appears to be making good progress currently. Fortunately there is no evidence of active infection at this time. Overall I feel like he is very close to complete closure. 12/26/2020 upon evaluation today patient appears to be doing excellent in regard to his wounds. In fact I am not certain that these are not even completely healed on initial inspection. Overall I am very pleased with where things stand today. Good news is that they are healed he is actually get ready to go out of town and that will be helpful as well as he will be a full part of the time. Readmission: 02/04/2021 upon evaluation today patient appears to be doing well at this point in regard to  his left heel that I previously saw him for. Unfortunately he is having issues with his right lower extremity. He has significant wounds at this point he also has erythema noted there is definite signs of  cellulitis which is unfortunate. With that being said I think we do need to address this sooner rather than later. The good news is he did have a nice trip to the beach. He tells me that he had no issues during that time. 4/27; patient on Bactrim. Culture showed methicillin sensitive staph aureus therefore the Bactrim should be effective. 2 small areas on the right leg are healed the area on the mid aspect of the tibia almost 100% covered in a very adherent necrotic debris. We have been using silver alginate 02/20/2021 upon evaluation today patient appears to be doing well with regard to his wound. He is showing signs of improvement which is great news overall very pleased with where things stand today. No fevers, chills, nausea, vomiting, or diarrhea. 02/27/2021 upon evaluation today patient appears to be doing well with regard to his leg ulcer. He is tolerating the dressing changes and overall appears to be doing quite excellent. I am extremely pleased with where things stand and overall I think patient is making great progress. There is no sign of active infection at this time which is also great news. 03/06/2021 upon evaluation today patient appears to be doing excellent in regard to his wounds. In fact he appears to be completely healed today based on what I am seeing. This is excellent news and overall I am extremely pleased with where he stands. Overall the patient is happy to hear this as well this has been quite sometime coming. Readmission: 04/01/2021 patient unfortunately returns for readmission today. He tells me that he has been wearing his compression socks on the right leg daily and he does not really know what is going on and why his legs are doing what they are doing. We did therefore go ahead and probe deeper into exactly what has been going on with him today. Subsequently the patient tells me that when he gets up and what we would call "first thing in the morning" are really 2 different  things". He does not tend to sleep well so he tells me that he will often wake up at 3:00 in the morning. He will then potentially going into the living room to get in his chair where he may read a book for a little while and then potentially fall asleep back in his chair. He then subsequently wake up around 5:00 or so and then get up and in his words "putter around". This often will end with him proceeding at some point around 8 AM or 9 AM to putting on his compression socks. With that being said this means that anywhere from the 3:00 in the morning till roughly around 9:00 in the morning he has no compression on yet he is sitting in his recliner, walking around and up and about, and this is at least 5 to 6 hours of noncompressed time. That may be our issue here but I did not realize until we discussed this further today. Fortunately there does not appear to be any signs of active infection at this time which is great news. With that being said he has multiple wounds of the bilateral lower extremities. 04/10/2021 upon evaluation today patient appears to be doing well with regard to his legs for the most part. He does look like he  had some injury where his wrap slid down nonetheless he did some "doctoring on them". I do feel like most of the areas honestly have cleared back up which is great news. There does not DEMARIUS, ARCHILA ([id number]) [id number] Page 3 of 11 appear to be any signs of active infection at this time which is also great news. No fevers, chills, nausea, vomiting, or diarrhea. 04/18/2021 upon evaluation today patient appears to be doing well with regard to his wounds in fact he appears to be completely healed which is great news. Fortunately there does not appear to be any signs of active infection which is great news. No fevers, chills, nausea, vomiting, or diarrhea. READMISSION 07/28/2021 This is a now 77 year old man we have had in this clinic for quite  a bit of this year. 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 He wears compression stockings. 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 He comes in an old running shoes although he says he wears better shoes at home. His history is that he noticed a blister in the right posterior heel. This open. He has been applying Neosporin to it currently the wound measures 2 x 1.4 cm. 100% slough covered. Not really offloading this in any rigorous way. Past medical history is essentially unchanged he has paroxysmal atrial fibrillation on Coumadin type 2 diabetes with a recent hemoglobin A1c of 7 on metformin. ABI in our clinic was 0.98 on the right 08/05/2021 upon evaluation today patient appears to be doing well with regard to his heel ulcer. Fortunately there does not appear to be any signs of active infection at this time. Overall I been very pleased with where things seem to be currently as far as the wound healing is concerned. Again this is first a lot of seeing him Dr. Leanord Hawking readmitted him last week. Nonetheless I think we are definitely making some progress here. 08/11/2021 upon evaluation today patient's wound on the heel actually showing signs of excellent improvement. I am actually very pleased with where things stand currently. No fevers, chills, nausea, vomiting, or diarrhea. There does not appear to be any need for sharp debridement today either which is also great news. 08/25/2021 upon evaluation today patient appears to be doing well with regard to his heel ulcer. This is actually looking significantly improved compared to last time I saw him. Fortunately there does not appear to be any evidence of active infection at this time. No fevers, chills, nausea, vomiting, or diarrhea. 09/01/2021 upon evaluation today patient appears to be doing decently well in regard to his wounds.  Fortunately there does not appear to be any signs of active infection at this time which is great news and overall very pleased in that regard. I do not see any evidence of active infection systemically which is great news as well. No fevers, chills, nausea, vomiting, or diarrhea. I think that the heel is making excellent progress. 09/15/2021 upon evaluation today patient's heel unfortunately is significantly worse compared to what it was previous. I do believe that at this time he would benefit from switching back to something a little bit more offloading from the cushion shoe he has right now this is more of a heel protector what I really need is a Prevalon offloading boot which I think is good to do a lot better for him than just the small heel protector. 09/23/2021 upon evaluation today patient appears  to be doing a little better in regards to last week's visit. Overall I think that he is making good progress which is great news and there does not appear to be any evidence of active infection at this time. No fevers, chills, nausea, vomiting, or diarrhea. 09/30/2021 upon evaluation patient's heel is actually showing signs of good improvement which is great news. Fortunately there does not appear to be any evidence of active infection locally nor systemically at this point. No fevers, chills, nausea, vomiting, or diarrhea. 10/07/2021 upon evaluation today patient appears to be doing well currently in regard to his heel ulcer. He has been tolerating the dressing changes without complication. Fortunately I do not see any signs of active infection at this time which is great news. No fevers, chills, nausea, vomiting, or diarrhea. 12/27; wound is measuring smaller. We have been using silver alginate 10/28/2021 upon evaluation today patient appears to be doing well currently in regard to his heel ulcer. I am actually very pleased with where things stand and I think he is making excellent progress.  Fortunately I do not see any signs of active infection locally nor systemically at this point which is great news. Nonetheless I do believe that the patient would benefit from a new offloading shoe and has been using it Velcro is no longer functioning properly and he tells me that he almost tripped and fell because of that today. Obviously I do not want him following that would be very bad. 11/11/2021 upon evaluation today patient appears to be doing excellent in regard to his wound. In fact this appears to be completely healed based on what I see currently. I do not see any signs of anything open or draining and I did double check just by clearing away some of the callus around the edges of the wound to ensure that there was nothing still open or hiding underneath. Once this was cleared away it appeared that the patient was doing significantly better at this time which is great news and there was nothing actually open. Readmission: 06-19-2022 upon evaluation today patient appears to be doing well currently in regard to his heel ulcer all things considered. He unfortunately is having a bit of breakdown in general as far as the heel is concerned not nearly as bad as what we noted last time he was here in the clinic. The good news is I do think that this should hopefully heal much more rapidly than what we noted last time. His past medical history has not changed he is on Coumadin. 07-03-2022 upon evaluation today patient appears to be doing better in regard to his heel. We will start to see some improvement here which is good news. Fortunately I do not see any evidence of infection locally or systemically at this time which is great news. No fevers, chills, nausea, vomiting, or diarrhea. 07-10-2022 upon evaluation today patient appears to be doing well currently in regard to his wound from the callus standpoint around the edges. Unfortunately from an actual wound bed standpoint he has some deep tissue injury  noted at this point. Again I am not sure exactly what is going on that is causing this pressure to the region but he is definitely gotten pressure that is occurring and causing this to not heal as effectively as what we would like to see. I discussed with him today that he may need to at night when he sleeping use a pillow up underneath his calf in order to prevent  the heel from touching the bed at all. I also think that it may be beneficial for him to monitor throughout his day and make sure there is no other time when he is getting the pressure to the heel. 07-23-2022 upon evaluation today patient appears to be doing poorly currently in regard to his heel ulcer. He has been tolerating the dressing changes without complication. Unfortunately I feel like he may be infected based on what I am seeing. 07-28-2022 I did review patient's culture results which showed positive for Proteus as well as Staphylococcus both of which would be treated with the Bactrim DS that I gave him at the last visit. This is good news and hopefully means that we should be able to apply the cast today as long as the wound itself does not appear to be doing poorly. Patient's wound bed actually showed signs of doing quite well and I am very pleased with where things stand and I do not see any signs of worsening overall which is great news. 08-04-2022 upon evaluation today patient appears to be doing well currently in regard to his heel ulcer. I am actually very pleased with where things stand and I do think this is looking significantly better. With regard to his right shin that is also showing signs of improvement which is great news. 08-10-2022 upon evaluation today patient's wound on the heel actually appears to be doing significantly better. In regard to the right anterior shin this is showing signs of healing it might even be completely healed but I still get a monitor I cannot get anything to fill away but also could not see  where there was anything actually draining at this point. I am tending to think healed but I want a monitor 1 more week before I call it for sure. 08-17-2022 upon evaluation today patient appears to be doing well currently in regard to his heel. Fortunately he is tolerating the dressing changes without complication. I do not see any signs of infection and overall I think he is making good progress here. We did get approval for the Apligraf but I think he had a $295 co-pay that would have to be paid per application. With that being said as good as he is doing right now I do not think that is necessary but is still an SADLER, TESCHNER ([id number]) [id number] Page 4 of 11 option depending on how things progress if we need to speed this up quite a bit. 08/24/2022; this patient has a wound on his left heel in the setting of type 2 diabetes. We have been using Prisma. He has a heel offloading boot 08-31-2022 upon evaluation today patient appears to be doing poorly in regard to his heel ulcer which is still showing signs of bruising I am just not as happy as I was when I saw him 2 weeks ago with this. He saw Dr. Leanord Hawking last week. Also did not look good at that point apparently. Nonetheless I think that the patient is still continuing to have some counterpressure getting to the wound bed unfortunately. 09-07-2022 upon evaluation today patient appears to be doing well currently in regard to his heel ulcer. He has been tolerating the dressing changes without complication. Fortunately there does not appear to be any signs of active infection locally or systemically at this time. Fortunately I do not see any evidence of active infection at this time. 09-15-2022 upon evaluation today patient appears to be doing well currently in regard to  his legs which in fact appear to be completely healed this is great news. With that being said his heel does have some erythema and warmth as well as  drainage I am concerned about cellulitis at this location. 09-21-2022 upon evaluation today patient's wound is actually showing signs of being a little bit smaller but still we are not doing nearly as well as what I would like to see. Fortunately I do not see any signs of infection at this time which is great news and overall I am extremely pleased in that regard. With that being said I do think that he needs to continue to elevate his leg although the edema is under great control with a compression wrap I think switching to a different dressing topically may be beneficial. My suggestion is to switch him over to a Iodoflex dressing. 09-28-2022 upon evaluation today patient appears to be doing well with regard to his heel ulcer. This is actually showing signs of improvement which is great news and overall I am extremely pleased with where we stand today. 10-06-2022 upon evaluation today patient actually appears to be making excellent progress at this point. Fortunately there does not appear to be any signs of active infection locally nor systemically which is great news and overall I am extremely pleased with where we stand. I do feel like he is actually making some pretty good progress here as we switch to the wrap along with the Iodoflex. 12/26; patient has a wound on the tip of his left heel. This is not a weightbearing surface. He is using a heel offloading boot and carefully offloading this at other times during the day. He is using Iodoflex and Zetuvitunder 3 layer compression 10-22-2022 upon evaluation today patient appears to be doing well currently in regard to his wound. He has been tolerating the dressing changes without complication. Fortunately there does not appear to be any signs of active infection locally nor systemically at this time. No fevers, chills, nausea, vomiting, or diarrhea. 08-30-2023 upon evaluation today patient appears to be doing well currently in regard to his heel ulcer  which is showing signs of good improvement. Fortunately I do not see any evidence of infection locally nor systemically which is great news and overall I am extremely pleased with where we stand. No fevers, chills, nausea, vomiting, or diarrhea. Unfortunately he does have a wound on the side of his fifth toe where I think this did rub on the shoe. 11-05-2022 upon evaluation today patient still still showing some signs of pressure at this point and there may have even been some fluid collection at this time. Fortunately I do not see any evidence of active infection locally nor systemically which is great news and overall I am extremely pleased with where we stand. No fevers, chills, nausea, vomiting, or diarrhea. 1/25; the patient's area on the dorsal aspect of the left fifth toe is healed. We have been using Iodoflex to the area on the left heel. The lateral wound is slightly down in length. 11-19-2022 upon evaluation today patient appears to be doing well currently in regard to his wound. He has been tolerating the dressing changes without complication. Fortunately there does not appear to be any signs of infection we been using Iodoflex up to this point. 11-26-2022 upon evaluation today patient appears to be doing poorly currently in regard to his wound. He has been tolerating the dressing changes with the Iodoflex without complication. With that being said he does not have  any signs of active infection systemically though locally I do feel like there is some evidence of infection here. 12-03-2022 upon evaluation today patient appears to be doing well currently in regard to his wound this is better with the Bactrim compared to last week still there was some significant slough and biofilm buildup which is going require sharp debridement at this point. Fortunately I do not see any signs of active infection locally nor systemically which is great news. 12-11-2022 upon evaluation today patient actually  appears to be doing significantly better at this time in regard to his heel. I am actually very pleased with where things stand currently. There is no signs of active infection locally or systemically at this point. 12-17-2022 upon evaluation today patient appears to be doing better as far as the overall appearance of his wound. I am actually very pleased in that regard. Fortunately there does not appear to be any signs of active infection locally nor systemically at this time. I do feel like that the patient is showing signs of excellent improvement in general. Nonetheless he needs something better for compression I think he should go ahead and see about getting compression socks. 3/7; tip of the left heel measurements are smaller he has been using Hydrofera Blue 01-07-2023 upon evaluation today patient appears to be doing well currently in regard to his wound. This has not been debrided since I last saw him and it definitely shows that definitely needs some debridement today. Fortunately I do not see any evidence of active infection locally nor systemically which is great news. 01-14-2023 upon evaluation today patient appears to be doing well currently although he does have some erythema and warmth to the heel I feel like he may have developed an infection yet again. This is something that is been ongoing issue we have been trying to manage his foot best we could. With that being said I do think he may need to go back on the Bactrim this has done well for him in the past. 01-21-2023 upon evaluation today patient appears to be doing better in regard to his heel. He is doing very well with the antibiotics and that is made a big difference for him based on what I am seeing I am very pleased with where we stand today. He is obviously doing extremely well which is great news. Electronic Signature(s) Signed: 01/21/2023 6:04:11 PM By: Lenda KelpStone III, Deaven Urwin PA-C Entered By: Lenda KelpStone III, Othelia Riederer on 01/21/2023  18:04:11 -------------------------------------------------------------------------------- Physical Exam Details Patient Name: Date of Service: [name] BanningFO RSYTH, [name] 01/21/2023 1:15 PM Medical Record Number: [id number] Patient Account Number: [id number] Barrie FolkFORSYTH, Jad J ([id number]) [id number] Page 5 of 11 Date of Birth/Sex: Treating RN: 08-26-1946 (77 y.o. Roel CluckM) Smith, Vicki Primary Care Provider: Aram BeechamSparks, Jeffrey Other Clinician: Referring Provider: Treating Provider/Extender: Hermine MessickStone, Gisele Pack Sparks, Jeffrey Weeks in Treatment: 30 Constitutional Well-nourished and well-hydrated in no acute distress. Respiratory normal breathing without difficulty. Psychiatric this patient is able to make decisions and demonstrates good insight into disease process. Alert and Oriented x 3. pleasant and cooperative. Notes Upon inspection patient's wound bed actually showed signs of good granulation epithelization at this point. Fortunately I do not see any evidence of active infection locally nor systemically which is great news and overall I am extremely pleased with where things stand currently. Electronic Signature(s) Signed: 01/21/2023 6:04:30 PM By: Lenda KelpStone III, Suezette Lafave PA-C Entered By: Lenda KelpStone III, Sylvester Salonga on 01/21/2023 18:04:30 -------------------------------------------------------------------------------- Physician Orders Details Patient Name: Date of Service: [name] BanningFO RSYTH, GEO  [name] 01/21/2023 1:15 PM Medical Record Number: [id number] Patient Account Number: [id number] Date of Birth/Sex: Treating RN: [birth year] (77 y.o. 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 Bathing/ Shower/ Hygiene May shower; gently cleanse wound with antibacterial soap, rinse and pat dry prior to dressing wounds Non-Wound Condition dditional non-wound orders/instructions: - Apply 40% Urea cream to dry cracked skin on feet. A Off-Loading Open toe surgical shoe with peg assist. Medications-Please add to medication list. ntibiotics - Start Bactrim P.O. A Wound Treatment Wound #10 - Calcaneus Wound Laterality: Left Topical: Gentamicin 3 x Per Week/30 Days Discharge Instructions: Apply as directed by provider. Prim Dressing: Hydrofera Blue Ready Transfer Foam, 2.5x2.5 (in/in) (Dispense As Written) 3 x Per Week/30 Days ary Discharge Instructions: Apply Hydrofera Blue Ready to wound bed as directed Secondary Dressing: ABD Pad 5x9 (in/in) (Generic) 3 x Per Week/30 Days Discharge Instructions: Cover with ABD pad Secured With: Kerlix Roll Sterile or Non-Sterile 6-ply 4.5x4 (yd/yd) 3 x Per Week/30 Days Discharge Instructions: Apply Mariah Milling as directed JAEDEN, MESSER ([id number]) [id number] Page 6 of 11 Secured With: Tubigrip Size D, 3x10 (in/yd) 3 x Per Week/30 Days Electronic Signature(s) Signed: 01/21/2023 4:51:01 PM By: Midge Aver MSN RN CNS WTA Signed: 01/22/2023 8:47:01 AM By: Lenda Kelp PA-C Entered By: Midge Aver on 01/21/2023 14:09:43 -------------------------------------------------------------------------------- Problem List Details Patient Name: Date of Service: [name], [name] 01/21/2023 1:15 PM Medical Record Number: [id number] Patient Account Number: [id number] Date of Birth/Sex: Treating RN: 11/12/1945 (77 y.o. [name] Primary Care Provider: [name] Other Clinician: Referring Provider: Treating Provider/Extender: Hermine Messick Weeks in  Treatment: 30 Active Problems ICD-10 Encounter Code Description Active Date MDM Diagnosis E11.621 Type 2 diabetes mellitus with foot ulcer 06/19/2022 No Yes 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sed 06/19/2022 No Yes E11.42 Type 2 diabetes mellitus with diabetic polyneuropathy 06/19/2022 No Yes Inactive Problems Resolved Problems Electronic Signature(s) Signed: 01/21/2023 4:51:01 PM By: Midge Aver MSN RN CNS WTA Signed: 01/22/2023 8:47:01 AM By: Lenda Kelp PA-C Previous Signature: 01/21/2023 1:38:29 PM Version By: Lenda Kelp PA-C Entered By: Midge Aver on 01/21/2023 14:11:40 -------------------------------------------------------------------------------- Progress Note Details Patient Name: Date of Service: [name], [name] 01/21/2023 1:15 PM Medical Record Number: [id number] Patient Account Number: [id number] Date of Birth/Sex: Treating RN: 11/26/1945 (77 y.o. [name] Primary Care Provider: [name] Other Clinician: Referring Provider: Treating Provider/Extender: Hermine Messick Weeks in Treatment: 30 Subjective Chief Complaint Information obtained from Patient Left heel ulcer History of Present Illness (HPI) 09/24/2020 on evaluation today patient presents today for a heel ulcer that he tells me has been present for about 2 years. He has been seeing podiatry and they have been attempting to manage this including what sounds to be a total contact cast, Unna boot, and just standard dressings otherwise as well. Most recently has been using triple antibiotic ointment. With that being said unfortunately despite everything he really has not had any significant improvement. He tells me that he cannot even really remember exactly how this began  but he presumed it may have rubbed on his shoes or something of that nature. 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 He also does have chronic pain in the way of neuropathy which he takes medications for including Cymbalta and MAYSIN, CARSTENS ([id number]) [phone].pdf Page 7 of 11 methadone. He tells me that this does seem to help. Fortunately there is no signs of active infection at this time. His most recent hemoglobin A1c was 8.1 though he knows this was this year he cannot tell me the exact time. His fluid pills currently to help with some of the lower extremity edema although he does obviously have signs of venous stasis/lymphedema. Currently there is no evidence of active infection. No fevers, chills, nausea, vomiting, or diarrhea. Patient has had fairly recent ABIs which were performed on 07/19/2020 and revealed that he has normal findings in both the ankle and toe locations bilaterally. His ABI on the right was 1.09 on the left was 1.08 with a TBI on the right of 0.88 and on the left of 0.94. Triphasic flow was noted throughout. 10/08/2020 on evaluation today patient appears to be doing pretty well in regard to his left heel currently in fact this is doing a great job and seems to be healing quite nicely. Unfortunately on his right leg he had a pile of wood that actually fell on him injuring his right leg this is somewhat erythematous has me concerned little bit about cellulitis though there is not really a good area to culture at this point. 10/24/2020 upon evaluation today patient appears to be doing well with regard to his heel ulcer. He is showing signs of improvement which is great news. His right leg is completely healed. Overall I feel like he is doing excellent and there is no signs of infection. 11/07/2020 upon evaluation today patient appears to be doing well with regard to his heel ulcer. He tells me that last week when he was unable to come in his wife actually  thought that the wound was very close to closing if not closed. Then it began to "reopen again". I really feel like what may have happened as the collagen may have dried over the wound bed and that because that misunderstanding with thinking that the wound was healing. With that being said I did not see it last week I do not know that for certain. Either way I feel like he is doing great today I see no signs of infection at this point. 11/26/2020 upon inspection today patient appears to be doing decently well in regard to his heel ulcer. He has been tolerating the dressing changes without complication. Fortunately there is no sign of active infection at this time. No fevers, chills, nausea, vomiting, or diarrhea. 12/10/2020 upon evaluation today patient appears to be doing fairly well in regard to the wound on his heel as well as what appears to be a new wound of the left first metatarsal head plantar aspect. This seems to be an area that was callus that has split as the patient tells me has been trying to walk on his toes more has probably where this came from. With that being said there does not appear to be signs of active infection which is great news. 12/17/2020 upon evaluation today patient actually appears to be making good  progress currently. Fortunately there is no evidence of active infection at this time. Overall I feel like he is very close to complete closure. 12/26/2020 upon evaluation today patient appears to be doing excellent in regard to his wounds. In fact I am not certain that these are not even completely healed on initial inspection. Overall I am very pleased with where things stand today. Good news is that they are healed he is actually get ready to go out of town and that will be helpful as well as he will be a full part of the time. Readmission: 02/04/2021 upon evaluation today patient appears to be doing well at this point in regard to his left heel that I previously saw him for.  Unfortunately he is having issues with his right lower extremity. He has significant wounds at this point he also has erythema noted there is definite signs of cellulitis which is unfortunate. With that being said I think we do need to address this sooner rather than later. The good news is he did have a nice trip to the beach. He tells me that he had no issues during that time. 4/27; patient on Bactrim. Culture showed methicillin sensitive staph aureus therefore the Bactrim should be effective. 2 small areas on the right leg are healed the area on the mid aspect of the tibia almost 100% covered in a very adherent necrotic debris. We have been using silver alginate 02/20/2021 upon evaluation today patient appears to be doing well with regard to his wound. He is showing signs of improvement which is great news overall very pleased with where things stand today. No fevers, chills, nausea, vomiting, or diarrhea. 02/27/2021 upon evaluation today patient appears to be doing well with regard to his leg ulcer. He is tolerating the dressing changes and overall appears to be doing quite excellent. I am extremely pleased with where things stand and overall I think patient is making great progress. There is no sign of active infection at this time which is also great news. 03/06/2021 upon evaluation today patient appears to be doing excellent in regard to his wounds. In fact he appears to be completely healed today based on what I am seeing. This is excellent news and overall I am extremely pleased with where he stands. Overall the patient is happy to hear this as well this has been quite sometime coming. Readmission: 04/01/2021 patient unfortunately returns for readmission today. He tells me that he has been wearing his compression socks on the right leg daily and he does not really know what is going on and why his legs are doing what they are doing. We did therefore go ahead and probe deeper into exactly what has  been going on with him today. Subsequently the patient tells me that when he gets up and what we would call "first thing in the morning" are really 2 different things". He does not tend to sleep well so he tells me that he will often wake up at 3:00 in the morning. He will then potentially going into the living room to get in his chair where he may read a book for a little while and then potentially fall asleep back in his chair. He then subsequently wake up around 5:00 or so and then get up and in his words "putter around". This often will end with him proceeding at some point around 8 AM or 9 AM to putting on his compression socks. With that being said this means that anywhere  from the 3:00 in the morning till roughly around 9:00 in the morning he has no compression on yet he is sitting in his recliner, walking around and up and about, and this is at least 5 to 6 hours of noncompressed time. That may be our issue here but I did not realize until we discussed this further today. Fortunately there does not appear to be any signs of active infection at this time which is great news. With that being said he has multiple wounds of the bilateral lower extremities. 04/10/2021 upon evaluation today patient appears to be doing well with regard to his legs for the most part. He does look like he had some injury where his wrap slid down nonetheless he did some "doctoring on them". I do feel like most of the areas honestly have cleared back up which is great news. There does not appear to be any signs of active infection at this time which is also great news. No fevers, chills, nausea, vomiting, or diarrhea. 04/18/2021 upon evaluation today patient appears to be doing well with regard to his wounds in fact he appears to be completely healed which is great news. Fortunately there does not appear to be any signs of active infection which is great news. No fevers, chills, nausea, vomiting, or  diarrhea. READMISSION 07/28/2021 This is a now 77 year old man we have had in this clinic for quite a bit of this year. 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 He wears compression stockings. 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 He comes in an old running shoes although he says he wears better shoes at home. His history is that he noticed a blister in the right posterior heel. This open. He has been applying Neosporin to it currently the wound measures 2 x 1.4 cm. 100% slough covered. Not really offloading this in any rigorous way. Past medical history is essentially unchanged he has paroxysmal atrial fibrillation on Coumadin type 2 diabetes with a recent hemoglobin A1c of 7 on metformin. ABI in our clinic was 0.98 on the right 08/05/2021 upon evaluation today patient appears to be doing well with regard to his heel ulcer. Fortunately there does not appear to be any signs of active infection at this time. Overall I been very pleased with where things seem to be currently as far as the wound healing is concerned. Again this is first a lot of seeing him Dr. Leanord Hawking readmitted him last week. Nonetheless I think we are definitely making some progress here. DRAYCEN, WEISHAUPT ([id number]) [id number] Page 8 of 11 08/11/2021 upon evaluation today patient's wound on the heel actually showing signs of excellent improvement. I am actually very pleased with where things stand currently. No fevers, chills, nausea, vomiting, or diarrhea. There does not appear to be any need for sharp debridement today either which is also great news. 08/25/2021 upon evaluation today patient appears to be doing well with regard to his heel ulcer. This is actually looking significantly improved compared to last time I saw him. Fortunately there does not appear to be any evidence of  active infection at this time. No fevers, chills, nausea, vomiting, or diarrhea. 09/01/2021 upon evaluation today patient appears to be doing decently well in regard to his wounds. Fortunately there does not appear to be any signs of active infection at this time which is great news and overall very pleased in that regard.  I do not see any evidence of active infection systemically which is great news as well. No fevers, chills, nausea, vomiting, or diarrhea. I think that the heel is making excellent progress. 09/15/2021 upon evaluation today patient's heel unfortunately is significantly worse compared to what it was previous. I do believe that at this time he would benefit from switching back to something a little bit more offloading from the cushion shoe he has right now this is more of a heel protector what I really need is a Prevalon offloading boot which I think is good to do a lot better for him than just the small heel protector. 09/23/2021 upon evaluation today patient appears to be doing a little better in regards to last week's visit. Overall I think that he is making good progress which is great news and there does not appear to be any evidence of active infection at this time. No fevers, chills, nausea, vomiting, or diarrhea. 09/30/2021 upon evaluation patient's heel is actually showing signs of good improvement which is great news. Fortunately there does not appear to be any evidence of active infection locally nor systemically at this point. No fevers, chills, nausea, vomiting, or diarrhea. 10/07/2021 upon evaluation today patient appears to be doing well currently in regard to his heel ulcer. He has been tolerating the dressing changes without complication. Fortunately I do not see any signs of active infection at this time which is great news. No fevers, chills, nausea, vomiting, or diarrhea. 12/27; wound is measuring smaller. We have been using silver alginate 10/28/2021 upon evaluation  today patient appears to be doing well currently in regard to his heel ulcer. I am actually very pleased with where things stand and I think he is making excellent progress. Fortunately I do not see any signs of active infection locally nor systemically at this point which is great news. Nonetheless I do believe that the patient would benefit from a new offloading shoe and has been using it Velcro is no longer functioning properly and he tells me that he almost tripped and fell because of that today. Obviously I do not want him following that would be very bad. 11/11/2021 upon evaluation today patient appears to be doing excellent in regard to his wound. In fact this appears to be completely healed based on what I see currently. I do not see any signs of anything open or draining and I did double check just by clearing away some of the callus around the edges of the wound to ensure that there was nothing still open or hiding underneath. Once this was cleared away it appeared that the patient was doing significantly better at this time which is great news and there was nothing actually open. Readmission: 06-19-2022 upon evaluation today patient appears to be doing well currently in regard to his heel ulcer all things considered. He unfortunately is having a bit of breakdown in general as far as the heel is concerned not nearly as bad as what we noted last time he was here in the clinic. The good news is I do think that this should hopefully heal much more rapidly than what we noted last time. His past medical history has not changed he is on Coumadin. 07-03-2022 upon evaluation today patient appears to be doing better in regard to his heel. We will start to see some improvement here which is good news. Fortunately I do not see any evidence of infection locally or systemically at this time which is great news.  No fevers, chills, nausea, vomiting, or diarrhea. 07-10-2022 upon evaluation today patient appears  to be doing well currently in regard to his wound from the callus standpoint around the edges. Unfortunately from an actual wound bed standpoint he has some deep tissue injury noted at this point. Again I am not sure exactly what is going on that is causing this pressure to the region but he is definitely gotten pressure that is occurring and causing this to not heal as effectively as what we would like to see. I discussed with him today that he may need to at night when he sleeping use a pillow up underneath his calf in order to prevent the heel from touching the bed at all. I also think that it may be beneficial for him to monitor throughout his day and make sure there is no other time when he is getting the pressure to the heel. 07-23-2022 upon evaluation today patient appears to be doing poorly currently in regard to his heel ulcer. He has been tolerating the dressing changes without complication. Unfortunately I feel like he may be infected based on what I am seeing. 07-28-2022 I did review patient's culture results which showed positive for Proteus as well as Staphylococcus both of which would be treated with the Bactrim DS that I gave him at the last visit. This is good news and hopefully means that we should be able to apply the cast today as long as the wound itself does not appear to be doing poorly. Patient's wound bed actually showed signs of doing quite well and I am very pleased with where things stand and I do not see any signs of worsening overall which is great news. 08-04-2022 upon evaluation today patient appears to be doing well currently in regard to his heel ulcer. I am actually very pleased with where things stand and I do think this is looking significantly better. With regard to his right shin that is also showing signs of improvement which is great news. 08-10-2022 upon evaluation today patient's wound on the heel actually appears to be doing significantly better. In regard to  the right anterior shin this is showing signs of healing it might even be completely healed but I still get a monitor I cannot get anything to fill away but also could not see where there was anything actually draining at this point. I am tending to think healed but I want a monitor 1 more week before I call it for sure. 08-17-2022 upon evaluation today patient appears to be doing well currently in regard to his heel. Fortunately he is tolerating the dressing changes without complication. I do not see any signs of infection and overall I think he is making good progress here. We did get approval for the Apligraf but I think he had a $295 co-pay that would have to be paid per application. With that being said as good as he is doing right now I do not think that is necessary but is still an option depending on how things progress if we need to speed this up quite a bit. 08/24/2022; this patient has a wound on his left heel in the setting of type 2 diabetes. We have been using Prisma. He has a heel offloading boot 08-31-2022 upon evaluation today patient appears to be doing poorly in regard to his heel ulcer which is still showing signs of bruising I am just not as happy as I was when I saw him 2 weeks ago  with this. He saw Dr. Leanord Hawking last week. Also did not look good at that point apparently. Nonetheless I think that the patient is still continuing to have some counterpressure getting to the wound bed unfortunately. 09-07-2022 upon evaluation today patient appears to be doing well currently in regard to his heel ulcer. He has been tolerating the dressing changes without complication. Fortunately there does not appear to be any signs of active infection locally or systemically at this time. Fortunately I do not see any evidence of active infection at this time. 09-15-2022 upon evaluation today patient appears to be doing well currently in regard to his legs which in fact appear to be completely healed this  is great news. With that being said his heel does have some erythema and warmth as well as drainage I am concerned about cellulitis at this location. 09-21-2022 upon evaluation today patient's wound is actually showing signs of being a little bit smaller but still we are not doing nearly as well as what I would like to see. Fortunately I do not see any signs of infection at this time which is great news and overall I am extremely pleased in that regard. With that being said I do think that he needs to continue to elevate his leg although the edema is under great control with a compression wrap I think switching to a different dressing topically may be beneficial. My suggestion is to switch him over to a Iodoflex dressing. 09-28-2022 upon evaluation today patient appears to be doing well with regard to his heel ulcer. This is actually showing signs of improvement which is great news and overall I am extremely pleased with where we stand today. 10-06-2022 upon evaluation today patient actually appears to be making excellent progress at this point. Fortunately there does not appear to be any signs of WYNTER, GRAVE ([id number]) [id number] Page 9 of 11 active infection locally nor systemically which is great news and overall I am extremely pleased with where we stand. I do feel like he is actually making some pretty good progress here as we switch to the wrap along with the Iodoflex. 12/26; patient has a wound on the tip of his left heel. This is not a weightbearing surface. He is using a heel offloading boot and carefully offloading this at other times during the day. He is using Iodoflex and Zetuvitunder 3 layer compression 10-22-2022 upon evaluation today patient appears to be doing well currently in regard to his wound. He has been tolerating the dressing changes without complication. Fortunately there does not appear to be any signs of active infection locally nor  systemically at this time. No fevers, chills, nausea, vomiting, or diarrhea. 08-30-2023 upon evaluation today patient appears to be doing well currently in regard to his heel ulcer which is showing signs of good improvement. Fortunately I do not see any evidence of infection locally nor systemically which is great news and overall I am extremely pleased with where we stand. No fevers, chills, nausea, vomiting, or diarrhea. Unfortunately he does have a wound on the side of his fifth toe where I think this did rub on the shoe. 11-05-2022 upon evaluation today patient still still showing some signs of pressure at this point and there may have even been some fluid collection at this time. Fortunately I do not see any evidence of active infection locally nor systemically which is great news and overall I am extremely pleased with where we stand. No fevers, chills, nausea, vomiting, or  diarrhea. 1/25; the patient's area on the dorsal aspect of the left fifth toe is healed. We have been using Iodoflex to the area on the left heel. The lateral wound is slightly down in length. 11-19-2022 upon evaluation today patient appears to be doing well currently in regard to his wound. He has been tolerating the dressing changes without complication. Fortunately there does not appear to be any signs of infection we been using Iodoflex up to this point. 11-26-2022 upon evaluation today patient appears to be doing poorly currently in regard to his wound. He has been tolerating the dressing changes with the Iodoflex without complication. With that being said he does not have any signs of active infection systemically though locally I do feel like there is some evidence of infection here. 12-03-2022 upon evaluation today patient appears to be doing well currently in regard to his wound this is better with the Bactrim compared to last week still there was some significant slough and biofilm buildup which is going require sharp  debridement at this point. Fortunately I do not see any signs of active infection locally nor systemically which is great news. 12-11-2022 upon evaluation today patient actually appears to be doing significantly better at this time in regard to his heel. I am actually very pleased with where things stand currently. There is no signs of active infection locally or systemically at this point. 12-17-2022 upon evaluation today patient appears to be doing better as far as the overall appearance of his wound. I am actually very pleased in that regard. Fortunately there does not appear to be any signs of active infection locally nor systemically at this time. I do feel like that the patient is showing signs of excellent improvement in general. Nonetheless he needs something better for compression I think he should go ahead and see about getting compression socks. 3/7; tip of the left heel measurements are smaller he has been using Hydrofera Blue 01-07-2023 upon evaluation today patient appears to be doing well currently in regard to his wound. This has not been debrided since I last saw him and it definitely shows that definitely needs some debridement today. Fortunately I do not see any evidence of active infection locally nor systemically which is great news. 01-14-2023 upon evaluation today patient appears to be doing well currently although he does have some erythema and warmth to the heel I feel like he may have developed an infection yet again. This is something that is been ongoing issue we have been trying to manage his foot best we could. With that being said I do think he may need to go back on the Bactrim this has done well for him in the past. 01-21-2023 upon evaluation today patient appears to be doing better in regard to his heel. He is doing very well with the antibiotics and that is made a big difference for him based on what I am seeing I am very pleased with where we stand today. He is obviously  doing extremely well which is great news. Objective Constitutional Well-nourished and well-hydrated in no acute distress. Vitals Time Taken: 1:42 PM, Height: 70 in, Weight: 265 lbs, BMI: 38, Temperature: 98.4 F, Pulse: 68 bpm, Respiratory Rate: 16 breaths/min, Blood Pressure: 136/71 mmHg. Respiratory normal breathing without difficulty. Psychiatric this patient is able to make decisions and demonstrates good insight into disease process. Alert and Oriented x 3. pleasant and cooperative. General Notes: Upon inspection patient's wound bed actually showed signs of good granulation epithelization  at this point. Fortunately I do not see any evidence of active infection locally nor systemically which is great news and overall I am extremely pleased with where things stand currently. Integumentary (Hair, Skin) Wound #10 status is Open. Original cause of wound was Gradually Appeared. The date acquired was: 06/05/2022. The wound has been in treatment 30 weeks. The wound is located on the Left Calcaneus. The wound measures 1cm length x 1.8cm width x 0.12cm depth; 1.414cm^2 area and 0.17cm^3 volume. There is Fat Layer (Subcutaneous Tissue) exposed. There is a medium amount of serosanguineous drainage noted. The wound margin is flat and intact. There is small (1-33%) pink granulation within the wound bed. There is a large (67-100%) amount of necrotic tissue within the wound bed including Adherent Slough. 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 There was a  Excisional Skin/Subcutaneous Tissue Debridement with a total area of 1.8 sq cm performed by Nelida Meuse., PA-C. With the following instrument(s): Curette to remove Viable and Non-Viable tissue/material. Material removed includes Subcutaneous Tissue and Slough and. No specimens were taken. A time out was conducted at 14:07, prior to the start of the procedure. A Minimum amount of bleeding was controlled with Pressure. The procedure was tolerated well. Post Debridement Measurements: 1cm length x 1.8cm width x 0.1cm depth; 0.141cm^3 volume. Character of Wound/Ulcer Post Debridement is stable. Severity of Tissue Post Debridement is: Fat layer exposed. Post procedure Diagnosis Wound #10: Same as Pre-Procedure Plan Follow-up Appointments: Wound #10 Left Calcaneus: Return Appointment in 1 week. Bathing/ Shower/ Hygiene: May shower; gently cleanse wound with antibacterial soap, rinse and pat dry prior to dressing wounds Non-Wound Condition: Additional non-wound orders/instructions: - Apply 40% Urea cream to dry cracked skin on feet. Off-Loading: Open toe surgical shoe with peg assist. Medications-Please add to medication list.: P.O. Antibiotics - Start Bactrim WOUND #10: - Calcaneus Wound Laterality: Left Topical: Gentamicin 3 x Per Week/30 Days Discharge Instructions: Apply as directed by provider. Prim Dressing: Hydrofera Blue Ready Transfer Foam, 2.5x2.5 (in/in) (Dispense As Written) 3 x Per Week/30 Days ary Discharge Instructions: Apply Hydrofera Blue Ready to wound bed as directed Secondary Dressing: ABD Pad 5x9 (in/in) (Generic) 3 x Per Week/30 Days Discharge Instructions: Cover with ABD pad Secured With: Kerlix Roll Sterile or Non-Sterile 6-ply 4.5x4 (yd/yd) 3 x Per Week/30 Days Discharge Instructions: Apply Kerlix as directed Secured With: Tubigrip Size D, 3x10 (in/yd) 3 x Per Week/30 Days 1. I would recommend based on what we are seeing that we go ahead and proceed with the wound care  measures as before I think he is doing excellent at this point and to be honest he is using the [facility] followed by ABD pad along with the Tubigrip I feel like this is excellent. 2. I am good recommend that he continue with the antibiotics. This is the Bactrim DS which seems to obviously be doing a great job here. We are using the gentamicin as well and to be honest if the Bactrim has not completely cleared the erythema come next week I am going to extend it for another couple of weeks. The key is to get  this cleared so we get this wound healed he is in agreement with that plan. We will see patient back for reevaluation in 1 week here in the clinic. If anything worsens or changes patient will contact our office for additional recommendations. Electronic Signature(s) Signed: 01/21/2023 6:05:11 PM By: Lenda Kelp PA-C Entered By: Lenda Kelp on 01/21/2023 18:05:11 -------------------------------------------------------------------------------- SuperBill Details Patient Name: Date of Service: [name], [name] 01/21/2023 Medical Record Number: [id number] Patient Account Number: [id number] Date of Birth/Sex: Treating RN: 04-Mar-1946 (77 y.o. [name] Primary Care Provider: [name] Other Clinician: Referring Provider: Treating Provider/Extender: [name] in Treatment: [address] BRISCOE, DANIELLO ([id number]) [id number] Page 11 of 11 ICD-10 Codes Code Description E11.621 Type 2 diabetes mellitus with foot ulcer 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sed E11.42 Type 2 diabetes mellitus with diabetic polyneuropathy Facility Procedures : CPT4 Code: [id number] 1 Description: 1042 - DEB SUBQ TISSUE 20 SQ CM/< ICD-10 Diagnosis Description 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sed Modifier: Quantity: 1 Physician Procedures :  CPT4 Code Description Modifier [id number] 11042 - WC PHYS SUBQ TISS 20 SQ CM ICD-10 Diagnosis Description 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sed Quantity: 1 Electronic Signature(s) Signed: 01/21/2023 6:06:07 PM By: Lenda Kelp PA-C Entered By: Lenda Kelp on 01/21/2023 18:06:07

## 2023-01-28 ENCOUNTER — Encounter: Payer: Medicare HMO | Admitting: Physician Assistant

## 2023-01-28 DIAGNOSIS — E11621 Type 2 diabetes mellitus with foot ulcer: Secondary | ICD-10-CM | POA: Diagnosis not present

## 2023-01-28 NOTE — Progress Notes (Addendum)
DAXEN, LANUM ([id number]) [id number] Page 1 of 11 Visit Report for 01/28/2023 Chief Complaint Document Details Patient Name: Date of Service: [name], [name] [facility] J. 01/28/2023 1:45 PM Medical Record Number: [id number] Patient Account Number: [id number] Date of Birth/Sex: Treating RN: [birth year] (77 y.o. [name] Primary Care Provider: [name] Other Clinician: Betha [name] Referring Provider: Treating Provider/Extender: [name] Weeks in Treatment: 31 Information Obtained from: Patient Chief Complaint Left heel ulcer Electronic Signature(s) Signed: 01/28/2023 1:55:26 PM By: [name] Derry PA-C Entered By: [name] on 01/28/2023 13:55:26 -------------------------------------------------------------------------------- Debridement Details Patient Name: Date of Service: [name], [name] [name] 01/28/2023 1:45 PM Medical Record Number: [id number] Patient Account Number: [id number] Date of Birth/Sex: Treating RN: [birth year] (77 y.o. [name], [name] Primary Care Provider: [name] Other Clinician: Betha [name] Referring Provider: Treating Provider/Extender: [name] in Treatment: 31 Debridement Performed for Assessment: Wound #10 Left Calcaneus Performed By: Physician [name] Derry, PA-C Debridement Type: Debridement Severity of Tissue Pre Debridement: Fat layer exposed Level of Consciousness (Pre-procedure): Awake and Alert Pre-procedure Verification/Time Out Yes - 14:18 Taken: Start Time: 14:18 T Area Debrided (L x W): otal 1.1 (cm) x 2.7 (cm) = 2.97 (cm) Tissue and other material debrided: Viable, Non-Viable, Slough, Subcutaneous, Slough Level: Skin/Subcutaneous Tissue Debridement Description: Excisional Instrument: Curette Bleeding: Minimum Hemostasis Achieved: Pressure Response to Treatment: Procedure was tolerated well Level of Consciousness (Post- Awake and Alert procedure): Post  Debridement Measurements of Total Wound Length: (cm) 1.1 Width: (cm) 2.4 Depth: (cm) 0.1 Volume: (cm) 0.207 Character of Wound/Ulcer Post Debridement: Stable Severity of Tissue Post Debridement: Fat layer exposed Post Procedure Diagnosis Same as Pre-procedure Electronic Signature(s) Signed: 01/28/2023 5:04:13 PM By: [name] Signed: 01/28/2023 10:26:23 PM By: [name] Derry PA-C Signed: 01/29/2023 12:36:06 PM By: [name], BSN, RN, CWS, [name] RN, BSN Entered By: [name] on 01/28/2023 14:19:46 [name] ([id number]) [id number] Page 2 of 11 -------------------------------------------------------------------------------- HPI Details Patient Name: Date of Service: [name], [name] [name] 01/28/2023 1:45 PM Medical Record Number: [id number] Patient Account Number: [id number] Date of Birth/Sex: Treating RN: 03-13-1946 (77 y.o. [name] Primary Care Provider: [name] Other Clinician: Betha [name] Referring Provider: Treating Provider/Extender: [name] Weeks in Treatment: 31 History of Present Illness HPI Description: 09/24/2020 on evaluation today patient presents today for a heel ulcer that he tells me has been present for about 2 years. He has been seeing podiatry and they have been attempting to manage this including what sounds to be a total contact cast, Unna boot, and just standard dressings otherwise as well. Most recently has been using triple antibiotic ointment. With that being said unfortunately despite everything he really has not had any significant improvement. He tells me that he cannot even really remember exactly how this began but he presumed it may have rubbed on his shoes or something of that nature. 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 He  also does have chronic pain in the way of neuropathy which he takes medications for including Cymbalta and methadone. He tells me that this does seem to help. Fortunately there is no signs of active infection at this time. His most recent hemoglobin A1c was 8.1 though he knows this was this year he cannot tell me the exact time. His fluid pills currently to  help with some of the lower extremity edema although he does obviously have signs of venous stasis/lymphedema. Currently there is no evidence of active infection. No fevers, chills, nausea, vomiting, or diarrhea. Patient has had fairly recent ABIs which were performed on 07/19/2020 and revealed that he has normal findings in both the ankle and toe locations bilaterally. His ABI on the right was 1.09 on the left was 1.08 with a TBI on the right of 0.88 and on the left of 0.94. Triphasic flow was noted throughout. 10/08/2020 on evaluation today patient appears to be doing pretty well in regard to his left heel currently in fact this is doing a great job and seems to be healing quite nicely. Unfortunately on his right leg he had a pile of wood that actually fell on him injuring his right leg this is somewhat erythematous has me concerned little bit about cellulitis though there is not really a good area to culture at this point. 10/24/2020 upon evaluation today patient appears to be doing well with regard to his heel ulcer. He is showing signs of improvement which is great news. His right leg is completely healed. Overall I feel like he is doing excellent and there is no signs of infection. 11/07/2020 upon evaluation today patient appears to be doing well with regard to his heel ulcer. He tells me that last week when he was unable to come in his wife actually thought that the wound was very close to closing if not closed. Then it began to "reopen again". I really feel like what may have happened as the collagen may have dried over the wound bed and that  because that misunderstanding with thinking that the wound was healing. With that being said I did not see it last week I do not know that for certain. Either way I feel like he is doing great today I see no signs of infection at this point. 11/26/2020 upon inspection today patient appears to be doing decently well in regard to his heel ulcer. He has been tolerating the dressing changes without complication. Fortunately there is no sign of active infection at this time. No fevers, chills, nausea, vomiting, or diarrhea. 12/10/2020 upon evaluation today patient appears to be doing fairly well in regard to the wound on his heel as well as what appears to be a new wound of the left first metatarsal head plantar aspect. This seems to be an area that was callus that has split as the patient tells me has been trying to walk on his toes more has probably where this came from. With that being said there does not appear to be signs of active infection which is great news. 12/17/2020 upon evaluation today patient actually appears to be making good progress currently. Fortunately there is no evidence of active infection at this time. Overall I feel like he is very close to complete closure. 12/26/2020 upon evaluation today patient appears to be doing excellent in regard to his wounds. In fact I am not certain that these are not even completely healed on initial inspection. Overall I am very pleased with where things stand today. Good news is that they are healed he is actually get ready to go out of town and that will be helpful as well as he will be a full part of the time. Readmission: 02/04/2021 upon evaluation today patient appears to be doing well at this point in regard to his left heel that I previously saw him for. Unfortunately he  is having issues with his right lower extremity. He has significant wounds at this point he also has erythema noted there is definite signs of cellulitis which is unfortunate. With  that being said I think we do need to address this sooner rather than later. The good news is he did have a nice trip to the beach. He tells me that he had no issues during that time. 4/27; patient on Bactrim. Culture showed methicillin sensitive staph aureus therefore the Bactrim should be effective. 2 small areas on the right leg are healed the area on the mid aspect of the tibia almost 100% covered in a very adherent necrotic debris. We have been using silver alginate 02/20/2021 upon evaluation today patient appears to be doing well with regard to his wound. He is showing signs of improvement which is great news overall very pleased with where things stand today. No fevers, chills, nausea, vomiting, or diarrhea. 02/27/2021 upon evaluation today patient appears to be doing well with regard to his leg ulcer. He is tolerating the dressing changes and overall appears to be doing quite excellent. I am extremely pleased with where things stand and overall I think patient is making great progress. There is no sign of active infection at this time which is also great news. 03/06/2021 upon evaluation today patient appears to be doing excellent in regard to his wounds. In fact he appears to be completely healed today based on what I am seeing. This is excellent news and overall I am extremely pleased with where he stands. Overall the patient is happy to hear this as well this has been quite sometime coming. Readmission: 04/01/2021 patient unfortunately returns for readmission today. He tells me that he has been wearing his compression socks on the right leg daily and he does not really know what is going on and why his legs are doing what they are doing. We did therefore go ahead and probe deeper into exactly what has been going on with him today. Subsequently the patient tells me that when he gets up and what we would call "first thing in the morning" are really 2 different things". He does not tend to sleep  well so he tells me that he will often wake up at 3:00 in the morning. He will then potentially going into the living room to get in his chair where he may read a book for a little while and then potentially fall asleep back in his chair. He then subsequently wake up around 5:00 or so and then get up and in his words "putter around". This often will end with him proceeding at some point around 8 AM or 9 AM to putting on his compression socks. With that being said this means that anywhere from the 3:00 in the morning till roughly around 9:00 in the morning he has no compression on yet he is sitting in his recliner, walking around and up and about, and this is at least 5 to 6 hours of noncompressed time. That may be our issue here but I did not realize until we discussed this further today. Fortunately there does not appear to be any signs of active infection at this time which is great news. With that being said he has multiple wounds of the bilateral lower extremities. 04/10/2021 upon evaluation today patient appears to be doing well with regard to his legs for the most part. He does look like he had some injury where his wrap slid down nonetheless he did  some "doctoring on them". I do feel like most of the areas honestly have cleared back up which is great news. There does not appear to be any signs of active infection at this time which is also great news. No fevers, chills, nausea, vomiting, or diarrhea. [name], [name] ([id number]) [id number] Page 3 of 11 04/18/2021 upon evaluation today patient appears to be doing well with regard to his wounds in fact he appears to be completely healed which is great news. Fortunately there does not appear to be any signs of active infection which is great news. No fevers, chills, nausea, vomiting, or diarrhea. READMISSION 07/28/2021 This is a now 77 year old man we have had in this clinic for quite a bit of this year. He has been in  here with bilateral lower extremity leg wounds probably secondary to chronic venous insufficiency. He wears compression stockings. 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 He comes in an old running shoes although he says he wears better shoes at home. His history is that he noticed a blister in the right posterior heel. This open. He has been applying Neosporin to it currently the wound measures 2 x 1.4 cm. 100% slough covered. Not really offloading this in any rigorous way. Past medical history is essentially unchanged he has paroxysmal atrial fibrillation on Coumadin type 2 diabetes with a recent hemoglobin A1c of 7 on metformin. ABI in our clinic was 0.98 on the right 08/05/2021 upon evaluation today patient appears to be doing well with regard to his heel ulcer. Fortunately there does not appear to be any signs of active infection at this time. Overall I been very pleased with where things seem to be currently as far as the wound healing is concerned. Again this is first a lot of seeing him Dr. Leanord Hawking readmitted him last week. Nonetheless I think we are definitely making some progress here. 08/11/2021 upon evaluation today patient's wound on the heel actually showing signs of excellent improvement. I am actually very pleased with where things stand currently. No fevers, chills, nausea, vomiting, or diarrhea. There does not appear to be any need for sharp debridement today either which is also great news. 08/25/2021 upon evaluation today patient appears to be doing well with regard to his heel ulcer. This is actually looking significantly improved compared to last time I saw him. Fortunately there does not appear to be any evidence of active infection at this time. No fevers, chills, nausea, vomiting, or diarrhea. 09/01/2021 upon evaluation today patient appears to be doing decently well in regard to his wounds. Fortunately there does not appear to be any  signs of active infection at this time which is great news and overall very pleased in that regard. I do not see any evidence of active infection systemically which is great news as well. No fevers, chills, nausea, vomiting, or diarrhea. I think that the heel is making excellent progress. 09/15/2021 upon evaluation today patient's heel unfortunately is significantly worse compared to what it was previous. I do believe that at this time he would benefit from switching back to something a little bit more offloading from the cushion shoe he has right now this is more of a heel protector what I really need is a Prevalon offloading boot which I think is good to do a lot better for him than just the small heel protector. 09/23/2021 upon evaluation today patient appears to be doing a little better in regards to last week's  visit. Overall I think that he is making good progress which is great news and there does not appear to be any evidence of active infection at this time. No fevers, chills, nausea, vomiting, or diarrhea. 09/30/2021 upon evaluation patient's heel is actually showing signs of good improvement which is great news. Fortunately there does not appear to be any evidence of active infection locally nor systemically at this point. No fevers, chills, nausea, vomiting, or diarrhea. 10/07/2021 upon evaluation today patient appears to be doing well currently in regard to his heel ulcer. He has been tolerating the dressing changes without complication. Fortunately I do not see any signs of active infection at this time which is great news. No fevers, chills, nausea, vomiting, or diarrhea. 12/27; wound is measuring smaller. We have been using silver alginate 10/28/2021 upon evaluation today patient appears to be doing well currently in regard to his heel ulcer. I am actually very pleased with where things stand and I think he is making excellent progress. Fortunately I do not see any signs of active  infection locally nor systemically at this point which is great news. Nonetheless I do believe that the patient would benefit from a new offloading shoe and has been using it Velcro is no longer functioning properly and he tells me that he almost tripped and fell because of that today. Obviously I do not want him following that would be very bad. 11/11/2021 upon evaluation today patient appears to be doing excellent in regard to his wound. In fact this appears to be completely healed based on what I see currently. I do not see any signs of anything open or draining and I did double check just by clearing away some of the callus around the edges of the wound to ensure that there was nothing still open or hiding underneath. Once this was cleared away it appeared that the patient was doing significantly better at this time which is great news and there was nothing actually open. Readmission: 06-19-2022 upon evaluation today patient appears to be doing well currently in regard to his heel ulcer all things considered. He unfortunately is having a bit of breakdown in general as far as the heel is concerned not nearly as bad as what we noted last time he was here in the clinic. The good news is I do think that this should hopefully heal much more rapidly than what we noted last time. His past medical history has not changed he is on Coumadin. 07-03-2022 upon evaluation today patient appears to be doing better in regard to his heel. We will start to see some improvement here which is good news. Fortunately I do not see any evidence of infection locally or systemically at this time which is great news. No fevers, chills, nausea, vomiting, or diarrhea. 07-10-2022 upon evaluation today patient appears to be doing well currently in regard to his wound from the callus standpoint around the edges. Unfortunately from an actual wound bed standpoint he has some deep tissue injury noted at this point. Again I am not sure  exactly what is going on that is causing this pressure to the region but he is definitely gotten pressure that is occurring and causing this to not heal as effectively as what we would like to see. I discussed with him today that he may need to at night when he sleeping use a pillow up underneath his calf in order to prevent the heel from touching the bed at all. I also think  that it may be beneficial for him to monitor throughout his day and make sure there is no other time when he is getting the pressure to the heel. 07-23-2022 upon evaluation today patient appears to be doing poorly currently in regard to his heel ulcer. He has been tolerating the dressing changes without complication. Unfortunately I feel like he may be infected based on what I am seeing. 07-28-2022 I did review patient's culture results which showed positive for Proteus as well as Staphylococcus both of which would be treated with the Bactrim DS that I gave him at the last visit. This is good news and hopefully means that we should be able to apply the cast today as long as the wound itself does not appear to be doing poorly. Patient's wound bed actually showed signs of doing quite well and I am very pleased with where things stand and I do not see any signs of worsening overall which is great news. 08-04-2022 upon evaluation today patient appears to be doing well currently in regard to his heel ulcer. I am actually very pleased with where things stand and I do think this is looking significantly better. With regard to his right shin that is also showing signs of improvement which is great news. 08-10-2022 upon evaluation today patient's wound on the heel actually appears to be doing significantly better. In regard to the right anterior shin this is showing signs of healing it might even be completely healed but I still get a monitor I cannot get anything to fill away but also could not see where there was anything actually  draining at this point. I am tending to think healed but I want a monitor 1 more week before I call it for sure. 08-17-2022 upon evaluation today patient appears to be doing well currently in regard to his heel. Fortunately he is tolerating the dressing changes without complication. I do not see any signs of infection and overall I think he is making good progress here. We did get approval for the Apligraf but I think he had a $295 co-pay that would have to be paid per application. With that being said as good as he is doing right now I do not think that is necessary but is still an option depending on how things progress if we need to speed this up quite a bit. [name], [name] ([id number]) [id number] Page 4 of 11 08/24/2022; this patient has a wound on his left heel in the setting of type 2 diabetes. We have been using Prisma. He has a heel offloading boot 08-31-2022 upon evaluation today patient appears to be doing poorly in regard to his heel ulcer which is still showing signs of bruising I am just not as happy as I was when I saw him 2 weeks ago with this. He saw Dr. Leanord Hawking last week. Also did not look good at that point apparently. Nonetheless I think that the patient is still continuing to have some counterpressure getting to the wound bed unfortunately. 09-07-2022 upon evaluation today patient appears to be doing well currently in regard to his heel ulcer. He has been tolerating the dressing changes without complication. Fortunately there does not appear to be any signs of active infection locally or systemically at this time. Fortunately I do not see any evidence of active infection at this time. 09-15-2022 upon evaluation today patient appears to be doing well currently in regard to his legs which in fact appear to be completely healed this  is great news. With that being said his heel does have some erythema and warmth as well as drainage I am concerned about  cellulitis at this location. 09-21-2022 upon evaluation today patient's wound is actually showing signs of being a little bit smaller but still we are not doing nearly as well as what I would like to see. Fortunately I do not see any signs of infection at this time which is great news and overall I am extremely pleased in that regard. With that being said I do think that he needs to continue to elevate his leg although the edema is under great control with a compression wrap I think switching to a different dressing topically may be beneficial. My suggestion is to switch him over to a Iodoflex dressing. 09-28-2022 upon evaluation today patient appears to be doing well with regard to his heel ulcer. This is actually showing signs of improvement which is great news and overall I am extremely pleased with where we stand today. 10-06-2022 upon evaluation today patient actually appears to be making excellent progress at this point. Fortunately there does not appear to be any signs of active infection locally nor systemically which is great news and overall I am extremely pleased with where we stand. I do feel like he is actually making some pretty good progress here as we switch to the wrap along with the Iodoflex. 12/26; patient has a wound on the tip of his left heel. This is not a weightbearing surface. He is using a heel offloading boot and carefully offloading this at other times during the day. He is using Iodoflex and Zetuvitunder 3 layer compression 10-22-2022 upon evaluation today patient appears to be doing well currently in regard to his wound. He has been tolerating the dressing changes without complication. Fortunately there does not appear to be any signs of active infection locally nor systemically at this time. No fevers, chills, nausea, vomiting, or diarrhea. 08-30-2023 upon evaluation today patient appears to be doing well currently in regard to his heel ulcer which is showing signs of good  improvement. Fortunately I do not see any evidence of infection locally nor systemically which is great news and overall I am extremely pleased with where we stand. No fevers, chills, nausea, vomiting, or diarrhea. Unfortunately he does have a wound on the side of his fifth toe where I think this did rub on the shoe. 11-05-2022 upon evaluation today patient still still showing some signs of pressure at this point and there may have even been some fluid collection at this time. Fortunately I do not see any evidence of active infection locally nor systemically which is great news and overall I am extremely pleased with where we stand. No fevers, chills, nausea, vomiting, or diarrhea. 1/25; the patient's area on the dorsal aspect of the left fifth toe is healed. We have been using Iodoflex to the area on the left heel. The lateral wound is slightly down in length. 11-19-2022 upon evaluation today patient appears to be doing well currently in regard to his wound. He has been tolerating the dressing changes without complication. Fortunately there does not appear to be any signs of infection we been using Iodoflex up to this point. 11-26-2022 upon evaluation today patient appears to be doing poorly currently in regard to his wound. He has been tolerating the dressing changes with the Iodoflex without complication. With that being said he does not have any signs of active infection systemically though locally I do feel  like there is some evidence of infection here. 12-03-2022 upon evaluation today patient appears to be doing well currently in regard to his wound this is better with the Bactrim compared to last week still there was some significant slough and biofilm buildup which is going require sharp debridement at this point. Fortunately I do not see any signs of active infection locally nor systemically which is great news. 12-11-2022 upon evaluation today patient actually appears to be doing significantly  better at this time in regard to his heel. I am actually very pleased with where things stand currently. There is no signs of active infection locally or systemically at this point. 12-17-2022 upon evaluation today patient appears to be doing better as far as the overall appearance of his wound. I am actually very pleased in that regard. Fortunately there does not appear to be any signs of active infection locally nor systemically at this time. I do feel like that the patient is showing signs of excellent improvement in general. Nonetheless he needs something better for compression I think he should go ahead and see about getting compression socks. 3/7; tip of the left heel measurements are smaller he has been using Hydrofera Blue 01-07-2023 upon evaluation today patient appears to be doing well currently in regard to his wound. This has not been debrided since I last saw him and it definitely shows that definitely needs some debridement today. Fortunately I do not see any evidence of active infection locally nor systemically which is great news. 01-14-2023 upon evaluation today patient appears to be doing well currently although he does have some erythema and warmth to the heel I feel like he may have developed an infection yet again. This is something that is been ongoing issue we have been trying to manage his foot best we could. With that being said I do think he may need to go back on the Bactrim this has done well for him in the past. 01-21-2023 upon evaluation today patient appears to be doing better in regard to his heel. He is doing very well with the antibiotics and that is made a big difference for him based on what I am seeing I am very pleased with where we stand today. He is obviously doing extremely well which is great news. 01-28-2023 upon evaluation today patient appears to be doing well currently in regard to his wound. This is actually showing signs of excellent improvement I am actually  very pleased with where we stand and I do believe that he is making good progress here. Fortunately I do not see any evidence of active infection locally nor systemically which is great news. No fevers, chills, nausea, vomiting, or diarrhea. Electronic Signature(s) Signed: 01/28/2023 2:30:34 PM By: [name] Derry PA-C Previous Signature: 01/28/2023 2:12:53 PM Version By: [name] Derry PA-C Previous Signature: 01/28/2023 2:10:25 PM Version By: [name] Derry PA-C Entered By: [name] on 01/28/2023 14:30:34 [name] ([id number]) [id number] Page 5 of 11 -------------------------------------------------------------------------------- Physical Exam Details Patient Name: Date of Service: [name], [name] [name] 01/28/2023 1:45 PM Medical Record Number: [id number] Patient Account Number: [id number] Date of Birth/Sex: Treating RN: January 01, 1946 (77 y.o. [name] Primary Care Provider: [name] Other Clinician: Betha [name] Referring Provider: Treating Provider/Extender: [name] Weeks in Treatment: 31 Constitutional Obese and well-hydrated in no acute distress. Respiratory normal breathing without difficulty. Psychiatric this patient is able to make decisions and demonstrates good insight into disease process. Alert and Oriented x 3. pleasant  and cooperative. Notes Upon inspection patient's wound bed actually showed signs of good granulation and epithelization at this point. I do think that he is making good progress. I do not see any signs of infection which is great news. No fevers, chills, nausea, vomiting, or diarrhea. Electronic Signature(s) Signed: 01/28/2023 2:31:59 PM By: [name] Derry PA-C Previous Signature: 01/28/2023 2:13:26 PM Version By: [name] Derry PA-C Previous Signature: 01/28/2023 2:11:42 PM Version By: [name] Derry PA-C Entered By: [name] on 01/28/2023  14:31:58 -------------------------------------------------------------------------------- Physician Orders Details Patient Name: Date of Service: [name], [name] [name] 01/28/2023 1:45 PM Medical Record Number: [id number] Patient Account Number: [id number] Date of Birth/Sex: Treating RN: Aug 14, 1946 (77 y.o. 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 Bathing/ Shower/ Hygiene May shower; gently cleanse wound with antibacterial soap, rinse and pat dry prior to dressing wounds Non-Wound Condition dditional non-wound orders/instructions: - Apply 40% Urea cream to dry cracked skin on feet. A Off-Loading Open toe surgical shoe with peg assist. Medications-Please add to medication list. ntibiotics - Start Bactrim P.O. A Wound Treatment Wound #10 - Calcaneus Wound Laterality: Left Topical: Gentamicin 3 x Per Week/30 Days Discharge Instructions: Apply as directed by provider. Prim Dressing: Hydrofera Blue Ready Transfer Foam, 2.5x2.5 (in/in) (Dispense As Written) 3 x Per Week/30 Days ary Discharge Instructions: Apply Hydrofera Blue Ready to wound bed as directed [name], [name] ([id number]) [id number] Page 6 of 11 Secondary Dressing: ABD Pad 5x9 (in/in) (Generic) 3 x Per Week/30 Days Discharge Instructions: Cover with ABD pad Secured With: Kerlix Roll Sterile or Non-Sterile 6-ply 4.5x4 (yd/yd) 3 x Per Week/30 Days Discharge  Instructions: Apply Kerlix as directed Secured With: Tubigrip Size D, 3x10 (in/yd) 3 x Per Week/30 Days Electronic Signature(s) Signed: 01/28/2023 5:04:13 PM By: [name] Signed: 01/28/2023 10:26:23 PM By: [name] Derry PA-C Entered By: [name] on 01/28/2023 14:20:26 -------------------------------------------------------------------------------- Problem List Details Patient Name: Date of Service: [name], [name] [name] 01/28/2023 1:45 PM Medical Record Number: [id number] Patient Account Number: [id number] Date of Birth/Sex: Treating RN: 1946/10/05 (77 y.o. [name], [name] Primary Care Provider: [name] Other Clinician: Betha [name] Referring Provider: Treating Provider/Extender: [name] Weeks in Treatment: 31 Active Problems ICD-10 Encounter Code Description Active Date MDM Diagnosis E11.621 Type 2 diabetes mellitus with foot ulcer 06/19/2022 No Yes 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sed 06/19/2022 No Yes E11.42 Type 2 diabetes mellitus with diabetic polyneuropathy 06/19/2022 No Yes Inactive Problems Resolved Problems Electronic Signature(s) Signed: 01/28/2023 1:55:23 PM By: [name] Derry PA-C Entered By: [name] on 01/28/2023 13:55:22 -------------------------------------------------------------------------------- Progress Note Details Patient Name: Date of Service: [name], [name] [name] 01/28/2023 1:45 PM Medical Record Number: [id number] Patient Account Number: [id number] Date of Birth/Sex: Treating RN: 1945/12/25 (77 y.o. [name] Primary Care Provider: [name] Other Clinician: Betha [name] Referring Provider: Treating Provider/Extender: [name] Weeks in Treatment: 31 Subjective Chief Complaint Information obtained from Patient Left heel ulcer History of Present Illness (HPI) 09/24/2020 on evaluation today patient presents today  for a heel ulcer that he tells  me has been present for about 2 years. He has been seeing podiatry and [name], [name] ([id number]) [id number] Page 7 of 11 they have been attempting to manage this including what sounds to be a total contact cast, Unna boot, and just standard dressings otherwise as well. Most recently has been using triple antibiotic ointment. With that being said unfortunately despite everything he really has not had any significant improvement. He tells me that he cannot even really remember exactly how this began but he presumed it may have rubbed on his shoes or something of that nature. 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 He tells me that this does seem to help. Fortunately there is no signs of active infection at this time. His most recent hemoglobin A1c was 8.1 though he knows this was this year he cannot tell me the exact time. His fluid pills currently to help with some of the lower extremity edema although he does obviously have signs of venous stasis/lymphedema. Currently there is no evidence of active infection. No fevers, chills, nausea, vomiting, or diarrhea. Patient has had fairly recent ABIs which were performed on 07/19/2020 and revealed that he has normal findings in both the ankle and toe locations bilaterally. His ABI on the right was 1.09 on the left was 1.08 with a TBI on the right of 0.88 and on the left of 0.94. Triphasic flow was noted throughout. 10/08/2020 on evaluation today patient appears to be doing pretty well in regard to his left heel currently in fact this is doing a great job and seems to be healing quite nicely. Unfortunately on his right leg he had a pile of wood that  actually fell on him injuring his right leg this is somewhat erythematous has me concerned little bit about cellulitis though there is not really a good area to culture at this point. 10/24/2020 upon evaluation today patient appears to be doing well with regard to his heel ulcer. He is showing signs of improvement which is great news. His right leg is completely healed. Overall I feel like he is doing excellent and there is no signs of infection. 11/07/2020 upon evaluation today patient appears to be doing well with regard to his heel ulcer. He tells me that last week when he was unable to come in his wife actually thought that the wound was very close to closing if not closed. Then it began to "reopen again". I really feel like what may have happened as the collagen may have dried over the wound bed and that because that misunderstanding with thinking that the wound was healing. With that being said I did not see it last week I do not know that for certain. Either way I feel like he is doing great today I see no signs of infection at this point. 11/26/2020 upon inspection today patient appears to be doing decently well in regard to his heel ulcer. He has been tolerating the dressing changes without complication. Fortunately there is no sign of active infection at this time. No fevers, chills, nausea, vomiting, or diarrhea. 12/10/2020 upon evaluation today patient appears to be doing fairly well in regard to the wound  on his heel as well as what appears to be a new wound of the left first metatarsal head plantar aspect. This seems to be an area that was callus that has split as the patient tells me has been trying to walk on his toes more has probably where this came from. With that being said there does not appear to be signs of active infection which is great news. 12/17/2020 upon evaluation today patient actually appears to be making good progress currently. Fortunately there is no evidence of active  infection at this time. Overall I feel like he is very close to complete closure. 12/26/2020 upon evaluation today patient appears to be doing excellent in regard to his wounds. In fact I am not certain that these are not even completely healed on initial inspection. Overall I am very pleased with where things stand today. Good news is that they are healed he is actually get ready to go out of town and that will be helpful as well as he will be a full part of the time. Readmission: 02/04/2021 upon evaluation today patient appears to be doing well at this point in regard to his left heel that I previously saw him for. Unfortunately he is having issues with his right lower extremity. He has significant wounds at this point he also has erythema noted there is definite signs of cellulitis which is unfortunate. With that being said I think we do need to address this sooner rather than later. The good news is he did have a nice trip to the beach. He tells me that he had no issues during that time. 4/27; patient on Bactrim. Culture showed methicillin sensitive staph aureus therefore the Bactrim should be effective. 2 small areas on the right leg are healed the area on the mid aspect of the tibia almost 100% covered in a very adherent necrotic debris. We have been using silver alginate 02/20/2021 upon evaluation today patient appears to be doing well with regard to his wound. He is showing signs of improvement which is great news overall very pleased with where things stand today. No fevers, chills, nausea, vomiting, or diarrhea. 02/27/2021 upon evaluation today patient appears to be doing well with regard to his leg ulcer. He is tolerating the dressing changes and overall appears to be doing quite excellent. I am extremely pleased with where things stand and overall I think patient is making great progress. There is no sign of active infection at this time which is also great news. 03/06/2021 upon evaluation  today patient appears to be doing excellent in regard to his wounds. In fact he appears to be completely healed today based on what I am seeing. This is excellent news and overall I am extremely pleased with where he stands. Overall the patient is happy to hear this as well this has been quite sometime coming. Readmission: 04/01/2021 patient unfortunately returns for readmission today. He tells me that he has been wearing his compression socks on the right leg daily and he does not really know what is going on and why his legs are doing what they are doing. We did therefore go ahead and probe deeper into exactly what has been going on with him today. Subsequently the patient tells me that when he gets up and what we would call "first thing in the morning" are really 2 different things". He does not tend to sleep well so he tells me that he will often wake up at 3:00 in the morning.  He will then potentially going into the living room to get in his chair where he may read a book for a little while and then potentially fall asleep back in his chair. He then subsequently wake up around 5:00 or so and then get up and in his words "putter around". This often will end with him proceeding at some point around 8 AM or 9 AM to putting on his compression socks. With that being said this means that anywhere from the 3:00 in the morning till roughly around 9:00 in the morning he has no compression on yet he is sitting in his recliner, walking around and up and about, and this is at least 5 to 6 hours of noncompressed time. That may be our issue here but I did not realize until we discussed this further today. Fortunately there does not appear to be any signs of active infection at this time which is great news. With that being said he has multiple wounds of the bilateral lower extremities. 04/10/2021 upon evaluation today patient appears to be doing well with regard to his legs for the most part. He does look like he  had some injury where his wrap slid down nonetheless he did some "doctoring on them". I do feel like most of the areas honestly have cleared back up which is great news. There does not appear to be any signs of active infection at this time which is also great news. No fevers, chills, nausea, vomiting, or diarrhea. 04/18/2021 upon evaluation today patient appears to be doing well with regard to his wounds in fact he appears to be completely healed which is great news. Fortunately there does not appear to be any signs of active infection which is great news. No fevers, chills, nausea, vomiting, or diarrhea. READMISSION 07/28/2021 This is a now 77 year old man we have had in this clinic for quite a bit of this year. 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 He wears compression stockings. 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 He comes in an old running shoes although he says he wears better shoes at home. His history is that he noticed a blister in the right posterior heel. This open. He has been applying Neosporin to it currently the wound measures 2 x 1.4 cm. 100% slough covered. Not really offloading this in any rigorous way. Past medical history is essentially unchanged he has paroxysmal atrial fibrillation on Coumadin type 2 diabetes with a recent hemoglobin A1c of 7 on metformin. ABI in our clinic was 0.98 on the right ASHAWN, RINEHART ([id number]) [id number] Page 8 of 11 08/05/2021 upon evaluation today patient appears to be doing well with regard to his heel ulcer. Fortunately there does not appear to be any signs of active infection at this time. Overall I been very pleased with where things seem to be currently as far as the wound healing is concerned. Again this is first a lot of seeing him Dr. Leanord Hawking readmitted him last week. Nonetheless I think  we are definitely making some progress here. 08/11/2021 upon evaluation today patient's wound on the heel actually showing signs of excellent improvement. I am actually very pleased with where things stand currently. No fevers, chills, nausea, vomiting, or diarrhea. There does not appear to be any need for sharp debridement today either which is also great news. 08/25/2021 upon evaluation today patient appears to be doing well with  regard to his heel ulcer. This is actually looking significantly improved compared to last time I saw him. Fortunately there does not appear to be any evidence of active infection at this time. No fevers, chills, nausea, vomiting, or diarrhea. 09/01/2021 upon evaluation today patient appears to be doing decently well in regard to his wounds. Fortunately there does not appear to be any signs of active infection at this time which is great news and overall very pleased in that regard. I do not see any evidence of active infection systemically which is great news as well. No fevers, chills, nausea, vomiting, or diarrhea. I think that the heel is making excellent progress. 09/15/2021 upon evaluation today patient's heel unfortunately is significantly worse compared to what it was previous. I do believe that at this time he would benefit from switching back to something a little bit more offloading from the cushion shoe he has right now this is more of a heel protector what I really need is a Prevalon offloading boot which I think is good to do a lot better for him than just the small heel protector. 09/23/2021 upon evaluation today patient appears to be doing a little better in regards to last week's visit. Overall I think that he is making good progress which is great news and there does not appear to be any evidence of active infection at this time. No fevers, chills, nausea, vomiting, or diarrhea. 09/30/2021 upon evaluation patient's heel is actually showing signs of good  improvement which is great news. Fortunately there does not appear to be any evidence of active infection locally nor systemically at this point. No fevers, chills, nausea, vomiting, or diarrhea. 10/07/2021 upon evaluation today patient appears to be doing well currently in regard to his heel ulcer. He has been tolerating the dressing changes without complication. Fortunately I do not see any signs of active infection at this time which is great news. No fevers, chills, nausea, vomiting, or diarrhea. 12/27; wound is measuring smaller. We have been using silver alginate 10/28/2021 upon evaluation today patient appears to be doing well currently in regard to his heel ulcer. I am actually very pleased with where things stand and I think he is making excellent progress. Fortunately I do not see any signs of active infection locally nor systemically at this point which is great news. Nonetheless I do believe that the patient would benefit from a new offloading shoe and has been using it Velcro is no longer functioning properly and he tells me that he almost tripped and fell because of that today. Obviously I do not want him following that would be very bad. 11/11/2021 upon evaluation today patient appears to be doing excellent in regard to his wound. In fact this appears to be completely healed based on what I see currently. I do not see any signs of anything open or draining and I did double check just by clearing away some of the callus around the edges of the wound to ensure that there was nothing still open or hiding underneath. Once this was cleared away it appeared that the patient was doing significantly better at this time which is great news and there was nothing actually open. Readmission: 06-19-2022 upon evaluation today patient appears to be doing well currently in regard to his heel ulcer all things considered. He unfortunately is having a bit of breakdown in general as far as the heel is  concerned not nearly as bad as what we noted last time he  was here in the clinic. The good news is I do think that this should hopefully heal much more rapidly than what we noted last time. His past medical history has not changed he is on Coumadin. 07-03-2022 upon evaluation today patient appears to be doing better in regard to his heel. We will start to see some improvement here which is good news. Fortunately I do not see any evidence of infection locally or systemically at this time which is great news. No fevers, chills, nausea, vomiting, or diarrhea. 07-10-2022 upon evaluation today patient appears to be doing well currently in regard to his wound from the callus standpoint around the edges. Unfortunately from an actual wound bed standpoint he has some deep tissue injury noted at this point. Again I am not sure exactly what is going on that is causing this pressure to the region but he is definitely gotten pressure that is occurring and causing this to not heal as effectively as what we would like to see. I discussed with him today that he may need to at night when he sleeping use a pillow up underneath his calf in order to prevent the heel from touching the bed at all. I also think that it may be beneficial for him to monitor throughout his day and make sure there is no other time when he is getting the pressure to the heel. 07-23-2022 upon evaluation today patient appears to be doing poorly currently in regard to his heel ulcer. He has been tolerating the dressing changes without complication. Unfortunately I feel like he may be infected based on what I am seeing. 07-28-2022 I did review patient's culture results which showed positive for Proteus as well as Staphylococcus both of which would be treated with the Bactrim DS that I gave him at the last visit. This is good news and hopefully means that we should be able to apply the cast today as long as the wound itself does not appear to be doing  poorly. Patient's wound bed actually showed signs of doing quite well and I am very pleased with where things stand and I do not see any signs of worsening overall which is great news. 08-04-2022 upon evaluation today patient appears to be doing well currently in regard to his heel ulcer. I am actually very pleased with where things stand and I do think this is looking significantly better. With regard to his right shin that is also showing signs of improvement which is great news. 08-10-2022 upon evaluation today patient's wound on the heel actually appears to be doing significantly better. In regard to the right anterior shin this is showing signs of healing it might even be completely healed but I still get a monitor I cannot get anything to fill away but also could not see where there was anything actually draining at this point. I am tending to think healed but I want a monitor 1 more week before I call it for sure. 08-17-2022 upon evaluation today patient appears to be doing well currently in regard to his heel. Fortunately he is tolerating the dressing changes without complication. I do not see any signs of infection and overall I think he is making good progress here. We did get approval for the Apligraf but I think he had a $295 co-pay that would have to be paid per application. With that being said as good as he is doing right now I do not think that is necessary but is still an option  depending on how things progress if we need to speed this up quite a bit. 08/24/2022; this patient has a wound on his left heel in the setting of type 2 diabetes. We have been using Prisma. He has a heel offloading boot 08-31-2022 upon evaluation today patient appears to be doing poorly in regard to his heel ulcer which is still showing signs of bruising I am just not as happy as I was when I saw him 2 weeks ago with this. He saw Dr. Leanord Hawking last week. Also did not look good at that point apparently. Nonetheless I  think that the patient is still continuing to have some counterpressure getting to the wound bed unfortunately. 09-07-2022 upon evaluation today patient appears to be doing well currently in regard to his heel ulcer. He has been tolerating the dressing changes without complication. Fortunately there does not appear to be any signs of active infection locally or systemically at this time. Fortunately I do not see any evidence of active infection at this time. 09-15-2022 upon evaluation today patient appears to be doing well currently in regard to his legs which in fact appear to be completely healed this is great news. With that being said his heel does have some erythema and warmth as well as drainage I am concerned about cellulitis at this location. 09-21-2022 upon evaluation today patient's wound is actually showing signs of being a little bit smaller but still we are not doing nearly as well as what I would like to see. Fortunately I do not see any signs of infection at this time which is great news and overall I am extremely pleased in that regard. With that being said I do think that he needs to continue to elevate his leg although the edema is under great control with a compression wrap I think switching to a different dressing topically may be beneficial. My suggestion is to switch him over to a Iodoflex dressing. [name], [name] ([id number]) [id number] Page 9 of 11 09-28-2022 upon evaluation today patient appears to be doing well with regard to his heel ulcer. This is actually showing signs of improvement which is great news and overall I am extremely pleased with where we stand today. 10-06-2022 upon evaluation today patient actually appears to be making excellent progress at this point. Fortunately there does not appear to be any signs of active infection locally nor systemically which is great news and overall I am extremely pleased with where we stand. I do  feel like he is actually making some pretty good progress here as we switch to the wrap along with the Iodoflex. 12/26; patient has a wound on the tip of his left heel. This is not a weightbearing surface. He is using a heel offloading boot and carefully offloading this at other times during the day. He is using Iodoflex and Zetuvitunder 3 layer compression 10-22-2022 upon evaluation today patient appears to be doing well currently in regard to his wound. He has been tolerating the dressing changes without complication. Fortunately there does not appear to be any signs of active infection locally nor systemically at this time. No fevers, chills, nausea, vomiting, or diarrhea. 08-30-2023 upon evaluation today patient appears to be doing well currently in regard to his heel ulcer which is showing signs of good improvement. Fortunately I do not see any evidence of infection locally nor systemically which is great news and overall I am extremely pleased with where we stand. No fevers, chills, nausea, vomiting, or diarrhea.  Unfortunately he does have a wound on the side of his fifth toe where I think this did rub on the shoe. 11-05-2022 upon evaluation today patient still still showing some signs of pressure at this point and there may have even been some fluid collection at this time. Fortunately I do not see any evidence of active infection locally nor systemically which is great news and overall I am extremely pleased with where we stand. No fevers, chills, nausea, vomiting, or diarrhea. 1/25; the patient's area on the dorsal aspect of the left fifth toe is healed. We have been using Iodoflex to the area on the left heel. The lateral wound is slightly down in length. 11-19-2022 upon evaluation today patient appears to be doing well currently in regard to his wound. He has been tolerating the dressing changes without complication. Fortunately there does not appear to be any signs of infection we been using  Iodoflex up to this point. 11-26-2022 upon evaluation today patient appears to be doing poorly currently in regard to his wound. He has been tolerating the dressing changes with the Iodoflex without complication. With that being said he does not have any signs of active infection systemically though locally I do feel like there is some evidence of infection here. 12-03-2022 upon evaluation today patient appears to be doing well currently in regard to his wound this is better with the Bactrim compared to last week still there was some significant slough and biofilm buildup which is going require sharp debridement at this point. Fortunately I do not see any signs of active infection locally nor systemically which is great news. 12-11-2022 upon evaluation today patient actually appears to be doing significantly better at this time in regard to his heel. I am actually very pleased with where things stand currently. There is no signs of active infection locally or systemically at this point. 12-17-2022 upon evaluation today patient appears to be doing better as far as the overall appearance of his wound. I am actually very pleased in that regard. Fortunately there does not appear to be any signs of active infection locally nor systemically at this time. I do feel like that the patient is showing signs of excellent improvement in general. Nonetheless he needs something better for compression I think he should go ahead and see about getting compression socks. 3/7; tip of the left heel measurements are smaller he has been using Hydrofera Blue 01-07-2023 upon evaluation today patient appears to be doing well currently in regard to his wound. This has not been debrided since I last saw him and it definitely shows that definitely needs some debridement today. Fortunately I do not see any evidence of active infection locally nor systemically which is great news. 01-14-2023 upon evaluation today patient appears to be  doing well currently although he does have some erythema and warmth to the heel I feel like he may have developed an infection yet again. This is something that is been ongoing issue we have been trying to manage his foot best we could. With that being said I do think he may need to go back on the Bactrim this has done well for him in the past. 01-21-2023 upon evaluation today patient appears to be doing better in regard to his heel. He is doing very well with the antibiotics and that is made a big difference for him based on what I am seeing I am very pleased with where we stand today. He is obviously doing extremely well  which is great news. 01-28-2023 upon evaluation today patient appears to be doing well currently in regard to his wound. This is actually showing signs of excellent improvement I am actually very pleased with where we stand and I do believe that he is making good progress here. Fortunately I do not see any evidence of active infection locally nor systemically which is great news. No fevers, chills, nausea, vomiting, or diarrhea. Objective Constitutional Obese and well-hydrated in no acute distress. Vitals Time Taken: 1:54 AM, Height: 70 in, Weight: 265 lbs, BMI: 38, Temperature: 98.5 F, Pulse: 75 bpm, Respiratory Rate: 18 breaths/min, Blood Pressure: 152/80 mmHg. Respiratory normal breathing without difficulty. Psychiatric this patient is able to make decisions and demonstrates good insight into disease process. Alert and Oriented x 3. pleasant and cooperative. General Notes: Upon inspection patient's wound bed actually showed signs of good granulation and epithelization at this point. I do think that he is making good progress. I do not see any signs of infection which is great news. No fevers, chills, nausea, vomiting, or diarrhea. Integumentary (Hair, Skin) Wound #10 status is Open. Original cause of wound was Gradually Appeared. The date acquired was: 06/05/2022. The wound  has been in treatment 31 weeks. [name], [name] ([id number]) [id number] Page 10 of 11 The wound is located on the Left Calcaneus. The wound measures 1.1cm length x 2.7cm width x 0.1cm depth; 2.333cm^2 area and 0.233cm^3 volume. There is Fat Layer (Subcutaneous Tissue) exposed. There is a medium amount of serosanguineous drainage noted. The wound margin is flat and intact. There is small (1-33%) pink granulation within the wound bed. There is a large (67-100%) amount of necrotic tissue within the wound bed including Adherent Slough. 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 There was a Excisional Skin/Subcutaneous Tissue Debridement with a total area of 2.97 sq cm performed by [name] Derry, PA-C. With the following instrument(s): Curette to remove Viable and Non-Viable tissue/material. Material removed includes Subcutaneous Tissue and Slough and. A time out was conducted at 14:18, prior to the start of the procedure. A Minimum amount of bleeding was controlled with Pressure. The procedure was tolerated well. Post Debridement Measurements: 1.1cm length x 2.4cm width x 0.1cm depth; 0.207cm^3 volume. Character of Wound/Ulcer Post Debridement is stable. Severity of Tissue Post Debridement is: Fat layer exposed. Post procedure Diagnosis Wound #10: Same as Pre-Procedure Plan Follow-up Appointments: Wound #10 Left Calcaneus: Return Appointment in 1 week. Bathing/ Shower/ Hygiene: May shower; gently cleanse wound with antibacterial soap, rinse and pat dry prior to dressing wounds Non-Wound Condition: Additional  non-wound orders/instructions: - Apply 40% Urea cream to dry cracked skin on feet. Off-Loading: Open toe surgical shoe with peg assist. Medications-Please add to medication list.: P.O. Antibiotics - Start Bactrim WOUND #10: - Calcaneus Wound Laterality: Left Topical: Gentamicin 3 x Per Week/30 Days Discharge Instructions: Apply as directed by provider. Prim Dressing: Hydrofera Blue Ready Transfer Foam, 2.5x2.5 (in/in) (Dispense As Written) 3 x Per Week/30 Days ary Discharge Instructions: Apply Hydrofera Blue Ready to wound bed as directed Secondary Dressing: ABD Pad 5x9 (in/in) (Generic) 3 x Per Week/30 Days Discharge Instructions: Cover with ABD pad Secured With: Kerlix Roll Sterile or Non-Sterile 6-ply  4.5x4 (yd/yd) 3 x Per Week/30 Days Discharge Instructions: Apply Kerlix as directed Secured With: Tubigrip Size D, 3x10 (in/yd) 3 x Per Week/30 Days 1. I would recommend currently based on what we are seeing that we go ahead and have the patient continue to monitor for any signs of infection or worsening. Based on what I am seeing I think the infection is under control organ to discontinue his antibiotics and hopefully will be able to see things improve going forward. 2. I am good recommend as well that the patient should continue to elevate his legs much as possible keeping pressure off the heel he also needs to be careful with his shoe hitting the opposite side of his top of the foot where he did actually cause a little bit of a injury today nothing too bad but he is to be very careful as his skin is very fragile. We will see patient back for reevaluation in 1 week here in the clinic. If anything worsens or changes patient will contact our office for additional recommendations. Electronic Signature(s) Signed: 01/28/2023 2:32:37 PM By: [name] Derry PA-C Previous Signature: 01/28/2023 2:12:03 PM Version By: [name] Derry PA-C Entered By: [name] on 01/28/2023 14:32:37 SuperBill  Details -------------------------------------------------------------------------------- [name] ([id number]) [id number] Page 11 of 11 Patient Name: Date of Service: [name], [name] [facility] J. 01/28/2023 Medical Record Number: [id number] Patient Account Number: [id number] Date of Birth/Sex: Treating RN: 1946/08/21 (77 y.o. [name], [name] Primary Care Provider: [name] Other Clinician: Betha [name] Referring Provider: Treating Provider/Extender: [name] Weeks in Treatment: 31 Diagnosis Coding ICD-10 Codes Code Description E11.621 Type 2 diabetes mellitus with foot ulcer 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sed E11.42 Type 2 diabetes mellitus with diabetic polyneuropathy Facility Procedures CPT4 Code Description Modifier Quantity [id number] 11042 - DEB SUBQ TISSUE 20 SQ CM/< 1 ICD-10 Diagnosis Description 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sed Physician Procedures Quantity CPT4 Code Description Modifier [id number] 11042 - WC PHYS SUBQ TISS 20 SQ CM 1 ICD-10 Diagnosis Description 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sed Electronic Signature(s) Signed: 01/28/2023 2:32:46 PM By: [name] Derry PA-C Entered By: [name] on 01/28/2023 14:32:46

## 2023-01-29 NOTE — Progress Notes (Signed)
[name], [name] ([id number]) [id number] Page 1 of 7 Visit Report for 01/28/2023 Arrival Information Details Patient Name: Date of Service: [name], GEO [facility] J. 01/28/2023 1:45 PM Medical Record Number: [id number] Patient Account Number: [id number] Date of Birth/Sex: Treating RN: 1946/01/11 (77 y.o. [name] Primary Care Eulene Pekar: [name] Other Clinician: Betha Loa Referring Aprile Dickenson: Treating Edin Kon/Extender: [name] in Treatment: 31 Visit Information History Since Last Visit All ordered tests and consults were completed: No Patient Arrived: [name] Added or deleted any medications: No Arrival Time: 13:53 Any new allergies or adverse reactions: No Transfer Assistance: None Had a fall or experienced change in No Patient Identification Verified: Yes activities of daily living that may affect Secondary Verification Process Completed: Yes risk of falls: Patient Requires Transmission-Based Precautions: No Signs or symptoms of abuse/neglect since No Patient Has Alerts: Yes last visito Patient Alerts: Patient on Blood Thinner Hospitalized since last visit: No Warfarin Implantable device outside of the clinic No Type II Diabetic excluding cellular tissue based products placed in the [name] since last visit: Has Dressing in Place as Prescribed: Yes Has Compression in Place as Prescribed: Yes Has Footwear/Offloading in Place as Yes Prescribed: Left: Surgical Shoe with Pressure Relief Insole Pain Present Now: No Electronic Signature(s) Signed: 01/28/2023 5:04:13 PM By: Betha Loa Entered By: Betha Loa on 01/28/2023 13:54:25 -------------------------------------------------------------------------------- Clinic Level of Care Assessment Details Patient Name: Date of Service: [name] J. 01/28/2023 1:45 PM Medical Record Number: [id number] Patient Account Number: [id number] Date of Birth/Sex:  Treating RN: 15-Dec-1945 (77 y.o. [name] Primary Care Adalyna Godbee: [name] Other Clinician: Betha Loa Referring Ziggy Chanthavong: Treating Iniko Robles/Extender: [name] in Treatment: 31 Clinic Level of Care Assessment Items TOOL 1 Quantity Score  - 0 Use when EandM and Procedure is performed on INITIAL visit ASSESSMENTS - Nursing Assessment / Reassessment  - 0 General Physical Exam (combine w/ comprehensive assessment (listed just below) when performed on new pt. evals)  - 0 Comprehensive Assessment (HX, ROS, Risk Assessments, Wounds Hx, etc.) ASSESSMENTS - Wound and Skin Assessment / Reassessment  - 0 Dermatologic / Skin Assessment (not related to wound area) ASSESSMENTS - Ostomy and/or Continence Assessment and Care  - 0 Incontinence Assessment and Management  - 0 Ostomy Care Assessment and Management (repouching, etc.) PROCESS - Coordination of Care  - 0 Simple Patient / Family Education for ongoing care  - 0 Complex (extensive) Patient / Family Education for ongoing care  - 0 Staff obtains Consents, Records, T Results / Process Orders est FIELD, STANISZEWSKI ([id number]) [id number] Page 2 of 7  - 0 Staff telephones HHA, Nursing Homes / Clarify orders / etc  - 0 Routine Transfer to another Facility (non-emergent condition)  - 0 Routine Hospital Admission (non-emergent condition)  - 0 New Admissions / Manufacturing engineer / Ordering NPWT Apligraf, etc. ,  - 0 Emergency Hospital Admission (emergent condition) PROCESS - Special Needs  - 0 Pediatric / Minor Patient Management  - 0 Isolation Patient Management  - 0 Hearing / Language / Visual special needs  - 0 Assessment of Community assistance (transportation, D/C planning, etc.)  - 0 Additional assistance / Altered mentation  - 0 Support Surface(s) Assessment (bed, cushion, seat, etc.) INTERVENTIONS -  Miscellaneous  - 0 External ear exam  - 0 Patient Transfer (multiple staff / Nurse, adult / Similar devices)  - 0 Simple Staple / Suture removal (25 or less)  - 0 Complex Staple / Suture removal (  26 or more) []  - 0 Hypo/Hyperglycemic Management (do not check if billed separately) []  - 0 Ankle / Brachial Index (ABI) - do not check if billed separately Has the patient been seen at the hospital within the last three years: Yes Total Score: 0 Level Of Care: ____ Electronic Signature(s) Signed: 01/28/2023 5:04:13 PM By: Betha Loa Entered By: Betha Loa on 01/28/2023 14:20:34 -------------------------------------------------------------------------------- Encounter Discharge Information Details Patient Name: Date of Service: [name], [name] 01/28/2023 1:45 PM Medical Record Number: [id number] Patient Account Number: [id number] Date of Birth/Sex: Treating RN: 1946/02/12 (77 y.o. [name] Primary Care Alyria Krack: [name] Other Clinician: Betha Loa Referring Rayanne Padmanabhan: Treating Jaymeson Mengel/Extender: [name] in Treatment: 31 Encounter Discharge Information Items Post Procedure Vitals Discharge Condition: Stable Temperature (F): 98.5 Ambulatory Status: Walker Pulse (bpm): 75 Discharge Destination: Home Respiratory Rate (breaths/min): 18 Transportation: Other Blood Pressure (mmHg): 152/80 Accompanied By: self Schedule Follow-up Appointment: Yes Clinical Summary of Care: Electronic Signature(s) Signed: 01/28/2023 5:04:13 PM By: Betha Loa Entered By: Betha Loa on 01/28/2023 16:01:35 -------------------------------------------------------------------------------- Lower Extremity Assessment Details Patient Name: Date of Service: [name], [name] 01/28/2023 1:45 PM Medical Record Number: [id number] Patient Account Number: [id number] Date of Birth/Sex: Treating RN: 1945-12-21 (77 y.o. [name] Primary Care  Metta Koranda: [name] Other Clinician: Anson, Miracle ([id number]) [id number] Page 3 of 7 Referring Aspyn Warnke: Treating Gracin Mcpartland/Extender: [name] in Treatment: 31 Edema Assessment Assessed: [Left: Yes] [Right: No] Edema: [Left: Ye] [Right: s] Calf Left: Right: Point of Measurement: 33 cm From Medial Instep 40.3 cm Ankle Left: Right: Point of Measurement: 11 cm From Medial Instep 23 cm Vascular Assessment Pulses: Dorsalis Pedis Palpable: [Left:Yes] Electronic Signature(s) Signed: 01/28/2023 5:04:13 PM By: Betha Loa Signed: 01/29/2023 12:36:06 PM By: Elliot Gurney, BSN, RN, CWS, [name] RN, BSN Entered By: Betha Loa on 01/28/2023 14:07:51 -------------------------------------------------------------------------------- Multi Wound Chart Details Patient Name: Date of Service: [name], [name] 01/28/2023 1:45 PM Medical Record Number: [id number] Patient Account Number: [id number] Date of Birth/Sex: Treating RN: 1946-07-24 (77 y.o. [name] Primary Care Veronika Heard: [name] Other Clinician: Betha Loa Referring Merrie Epler: Treating Winthrop Shannahan/Extender: Hermine Messick Weeks in Treatment: 31 Vital Signs Height(in): 70 Pulse(bpm): 75 Weight(lbs): 265 Blood Pressure(mmHg): 152/80 Body Mass Index(BMI): 38 Temperature(F): 98.5 Respiratory Rate(breaths/min): 18 [10:Photos:] [N/A:N/A] Left Calcaneus N/A N/A Wound Location: Gradually Appeared N/A N/A Wounding 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 N/A N/A Secondary Etiology: Cataracts, Arrhythmia, Coronary N/A N/A Comorbid History: Artery Disease, Hypertension, Type II Diabetes, Osteoarthritis, Neuropathy 06/05/2022 N/A N/A Date Acquired: 31 N/A N/A Weeks of Treatment: Open N/A N/A Wound Status: No N/A N/A Wound Recurrence: 1.1x2.7x0.1 N/A N/A Measurements L x W x D  (cm) 2.333 N/A N/A A (cm) : rea 0.233 N/A N/A Volume (cm) : -65.00% N/A N/A % Reduction in Area: 17.70% N/A N/A % Reduction in VolumeLUCCAS, STRYCKER ([id number]) [id number] Page 4 of 7 Grade 1 N/A N/A Classification: Medium N/A N/A Exudate A mount: Serosanguineous N/A N/A Exudate Type: red, brown N/A N/A Exudate Color: Flat and Intact N/A N/A Wound Margin: Small (1-33%) N/A N/A Granulation A mount: Pink N/A N/A Granulation Quality: Large (67-100%) N/A N/A Necrotic A mount: Fat Layer (Subcutaneous Tissue): Yes N/A N/A Exposed Structures: Fascia: No Tendon: No Muscle: No Joint: No Bone: No Small (1-33%) N/A N/A Epithelialization: Treatment Notes Electronic Signature(s) Signed: 01/28/2023 5:04:13 PM By: Betha Loa Entered By:  Betha Loa on 01/28/2023 14:08:01 -------------------------------------------------------------------------------- Multi-Disciplinary Care Plan Details Patient Name: Date of Service: [name], GEO [facility] J. 01/28/2023 1:45 PM Medical Record Number: [id number] Patient Account Number: [id number] Date of Birth/Sex: Treating RN: 11-10-1945 (77 y.o. [name], [name] Primary Care Saman Umstead: [name] Other Clinician: Betha Loa Referring Traveon Louro: Treating Ema Hebner/Extender: Hermine Messick Weeks in Treatment: 31 Active Inactive Wound/Skin Impairment Nursing Diagnoses: Impaired tissue integrity Goals: Patient/caregiver will verbalize understanding of skin care regimen Date Initiated: 06/19/2022 Date Inactivated: 07/10/2022 Target Resolution Date: 06/19/2022 Goal Status: Met Ulcer/skin breakdown will have a volume reduction of 30% by week 4 Date Initiated: 06/19/2022 Date Inactivated: 07/28/2022 Target Resolution Date: 07/17/2022 Goal Status: Met Ulcer/skin breakdown will have a volume reduction of 50% by week 8 Date Initiated: 07/28/2022 Target Resolution Date: 02/17/2023 Goal Status:  Active Interventions: Assess patient/caregiver ability to obtain necessary supplies Assess patient/caregiver ability to perform ulcer/skin care regimen upon admission and as needed Assess ulceration(s) every visit Provide education on ulcer and skin care Treatment Activities: Skin care regimen initiated : 06/19/2022 Topical wound management initiated : 06/19/2022 Notes: Electronic Signature(s) Signed: 01/28/2023 5:04:13 PM By: Betha Loa Signed: 01/29/2023 12:36:06 PM By: Elliot Gurney, BSN, RN, CWS, [name] RN, BSN Entered By: Betha Loa on 01/28/2023 16:00:14 Barrie Folk ([id number]) [id number] Page 5 of 7 -------------------------------------------------------------------------------- Pain Assessment Details Patient Name: Date of Service: [name], [name] 01/28/2023 1:45 PM Medical Record Number: [id number] Patient Account Number: [id number] Date of Birth/Sex: Treating RN: Mar 16, 1946 (77 y.o. [name] Primary Care Deondrae Mcgrail: [name] Other Clinician: Betha Loa Referring Korynn Kenedy: Treating Allon Costlow/Extender: Hermine Messick Weeks in Treatment: 31 Active Problems Location of Pain Severity and Description of Pain Patient Has Paino No Site Locations Pain Management and Medication Current Pain Management: Electronic Signature(s) Signed: 01/28/2023 5:04:13 PM By: Betha Loa Signed: 01/29/2023 12:36:06 PM By: Elliot Gurney, BSN, RN, CWS, [name] RN, BSN Entered By: Betha Loa on 01/28/2023 13:58:32 -------------------------------------------------------------------------------- Patient/Caregiver Education Details Patient Name: Date of Service: [name], GEO Cherrie Gauze 4/11/2024andnbsp1:45 PM Medical Record Number: [id number] Patient Account Number: [id number] Date of Birth/Gender: Treating RN: 1945/11/17 (77 y.o. [name] Primary Care Physician: [name] Other Clinician: Betha Loa Referring Physician: Treating  Physician/Extender: [name] in Treatment: 31 Education Assessment Education Provided To: Patient Education Topics Provided Wound/Skin Impairment: Handouts: Other: continue wound care as directed Methods: Explain/Verbal Responses: State content correctly Electronic Signature(s) Signed: 01/28/2023 5:04:13 PM By: Betha Loa Entered By: Betha Loa on 01/28/2023 16:00:43 Barrie Folk ([id number]) [id number] Page 6 of 7 -------------------------------------------------------------------------------- Wound Assessment Details Patient Name: Date of Service: [name], [name] 01/28/2023 1:45 PM Medical Record Number: [id number] Patient Account Number: [id number] Date of Birth/Sex: Treating RN: October 19, 1946 (77 y.o. [name], [name] Primary Care Roney Youtz: [name] Other Clinician: Betha Loa Referring Misbah Hornaday: Treating Alysha Doolan/Extender: Hermine Messick Weeks in Treatment: 31 Wound Status Wound Number: 10 Primary Diabetic Wound/Ulcer of the Lower Extremity Etiology: Wound Location: Left Calcaneus Secondary Pressure Ulcer Wounding Event: Gradually Appeared Etiology: Date Acquired: 06/05/2022 Wound Open Weeks Of Treatment: 31 Status: Clustered Wound: No Comorbid Cataracts, Arrhythmia, Coronary Artery Disease, Hypertension, History: Type II Diabetes, Osteoarthritis, Neuropathy Photos Wound Measurements Length: (cm) 1.1 Width: (cm) 2.7 Depth: (cm) 0.1 Area: (cm) 2.333 Volume: (cm) 0.233 % Reduction in Area: -65% % Reduction in Volume: 17.7% Epithelialization: Small (1-33%) Wound Description Classification: Grade 1 Wound Margin: Flat and Intact Exudate Amount: Medium Exudate Type: Serosanguineous Exudate Color: red, brown Foul  Odor After Cleansing: No Slough/Fibrino Yes Wound Bed Granulation Amount: Small (1-33%) Exposed Structure Granulation Quality: Pink Fascia Exposed: No Necrotic  Amount: Large (67-100%) Fat Layer (Subcutaneous Tissue) Exposed: Yes Necrotic Quality: Adherent Slough Tendon Exposed: No Muscle Exposed: No Joint Exposed: No Bone Exposed: No Treatment Notes Wound #10 (Calcaneus) Wound Laterality: Left Cleanser Peri-Wound Care Topical Gentamicin Discharge Instruction: Apply as directed by Venus Gilles. Primary Dressing Hydrofera Blue Ready Transfer Foam, 2.5x2.5 (in/in) Discharge Instruction: Apply Hydrofera Blue Ready to wound bed as directed Secondary Dressing ABD Pad 5x9 (in/in) Discharge Instruction: Cover with ABD pad DEIVI, HUCKINS ([id number]) [id number] Page 7 of 7 Secured With [employer] or Non-Sterile 6-ply 4.5x4 (yd/yd) Discharge Instruction: Apply Kerlix as directed Tubigrip Size D, 3x10 (in/yd) Compression Wrap Compression Stockings Add-Ons Electronic Signature(s) Signed: 01/28/2023 5:04:13 PM By: Betha Loa Signed: 01/29/2023 12:36:06 PM By: Elliot Gurney, BSN, RN, CWS, [name] RN, BSN Entered By: Betha Loa on 01/28/2023 14:06:51 -------------------------------------------------------------------------------- Vitals Details Patient Name: Date of Service: [name], [name] 01/28/2023 1:45 PM Medical Record Number: [id number] Patient Account Number: [id number] Date of Birth/Sex: Treating RN: 1946/04/21 (77 y.o. [name], [name] Primary Care Tamey Wanek: [name] Other Clinician: Betha Loa Referring Eliyana Pagliaro: Treating Gay Moncivais/Extender: Hermine Messick Weeks in Treatment: 31 Vital Signs Time Taken: 01:54 Temperature (F): 98.5 Height (in): 70 Pulse (bpm): 75 Weight (lbs): 265 Respiratory Rate (breaths/min): 18 Body Mass Index (BMI): 38 Blood Pressure (mmHg): 152/80 Reference Range: 80 - 120 mg / dl Electronic Signature(s) Signed: 01/28/2023 5:04:13 PM By: Betha Loa Entered By: Betha Loa on 01/28/2023 13:58:26

## 2023-02-04 ENCOUNTER — Encounter: Payer: Medicare HMO | Admitting: Physician Assistant

## 2023-02-04 DIAGNOSIS — E11621 Type 2 diabetes mellitus with foot ulcer: Secondary | ICD-10-CM | POA: Diagnosis not present

## 2023-02-04 NOTE — Progress Notes (Addendum)
JAVONN, GAUGER ([id number]) [id number] Page 1 of 7 Visit Report for 02/04/2023 Arrival Information Details Patient Name: Date of Service: [name], GEO [facility] J. 02/04/2023 1:15 PM Medical Record Number: [id number] Patient Account Number: [id number] Date of Birth/Sex: Treating RN: Nov 28, 1945 (77 y.o. [name] Primary Care Nyle Limb: [name] Other Clinician: Referring Marsean Elkhatib: Treating Mohamadou Maciver/Extender: [name] in Treatment: 32 Visit Information History Since Last Visit Added or deleted any medications: No Patient Arrived: Cane Has Dressing in Place as Prescribed: Yes Arrival Time: 13:39 Pain Present Now: No Accompanied By: self Transfer Assistance: None Patient Identification Verified: Yes Secondary Verification Process Completed: Yes Patient Requires Transmission-Based Precautions: No Patient Has Alerts: Yes Patient Alerts: Patient on Blood Thinner Warfarin Type II Diabetic Electronic Signature(s) Signed: 02/04/2023 4:52:52 PM By: Midge Aver MSN RN CNS WTA Previous Signature: 02/04/2023 1:40:42 PM Version By: Midge Aver MSN RN CNS WTA Entered By: Midge Aver on 02/04/2023 14:14:57 -------------------------------------------------------------------------------- Clinic Level of Care Assessment Details Patient Name: Date of Service: [name], [name] 02/04/2023 1:15 PM Medical Record Number: [id number] Patient Account Number: [id number] Date of Birth/Sex: Treating RN: [birth year] (77 y.o. [name] Primary Care Ario Mcdiarmid: [name] Other Clinician: Referring Xitlaly Ault: Treating Artha Stavros/Extender: [name] in Treatment: 32 Clinic Level of Care Assessment Items TOOL 1 Quantity Score  - 0 Use when EandM and Procedure is performed on INITIAL visit ASSESSMENTS - Nursing Assessment / Reassessment  - 0 General Physical Exam (combine w/ comprehensive assessment (listed just  below) when performed on new pt. evals)  - 0 Comprehensive Assessment (HX, ROS, Risk Assessments, Wounds Hx, etc.) ASSESSMENTS - Wound and Skin Assessment / Reassessment  - 0 Dermatologic / Skin Assessment (not related to wound area) ASSESSMENTS - Ostomy and/or Continence Assessment and Care  - 0 Incontinence Assessment and Management  - 0 Ostomy Care Assessment and Management (repouching, etc.) PROCESS - Coordination of Care  - 0 Simple Patient / Family Education for ongoing care  - 0 Complex (extensive) Patient / Family Education for ongoing care  - 0 Staff obtains Chiropractor, Records, T Results / Process Orders est  - 0 Staff telephones HHA, Nursing Homes / Clarify orders / etc  - 0 Routine Transfer to another Facility (non-emergent condition)  - 0 Routine Hospital Admission (non-emergent condition)  - 0 New Admissions / Insurance Authorizations / Ordering NPWT Apligraf, etcBARRE, AYDELOTT ([id number]) [id number] Page 2 of 7  - 0 Emergency Hospital Admission (emergent condition) PROCESS - Special Needs  - 0 Pediatric / Minor Patient Management  - 0 Isolation Patient Management  - 0 Hearing / Language / Visual special needs  - 0 Assessment of Community assistance (transportation, D/C planning, etc.)  - 0 Additional assistance / Altered mentation  - 0 Support Surface(s) Assessment (bed, cushion, seat, etc.) INTERVENTIONS - Miscellaneous  - 0 External ear exam  - 0 Patient Transfer (multiple staff / Nurse, adult / Similar devices)  - 0 Simple Staple / Suture removal (25 or less)  - 0 Complex Staple / Suture removal (26 or more)  - 0 Hypo/Hyperglycemic Management (do not check if billed separately)  - 0 Ankle / Brachial Index (ABI) - do not check if billed separately Has the patient been seen at the hospital within the last three years: Yes Total Score: 0 Level Of Care:  ____ Electronic Signature(s) Signed: 02/04/2023 4:52:52 PM By: Midge Aver MSN RN CNS WTA Entered By: Midge Aver on 02/04/2023 14:18:54 --------------------------------------------------------------------------------  Encounter Discharge Information Details Patient Name: Date of Service: [name], GEO [facility] J. 02/04/2023 1:15 PM Medical Record Number: [id number] Patient Account Number: [id number] Date of Birth/Sex: Treating RN: [birth year] (77 y.o. [name] Primary Care Caileen Veracruz: [name] Other Clinician: Referring Remmington Urieta: Treating Samona Chihuahua/Extender: [name] in Treatment: 32 Encounter Discharge Information Items Post Procedure Vitals Discharge Condition: Stable Temperature (F): 97.8 Ambulatory Status: Walker Pulse (bpm): 69 Discharge Destination: Home Respiratory Rate (breaths/min): 16 Transportation: Private Auto Blood Pressure (mmHg): 122/65 Accompanied By: self Schedule Follow-up Appointment: Yes Clinical Summary of Care: Electronic Signature(s) Signed: 02/04/2023 4:52:52 PM By: Midge Aver MSN RN CNS WTA Entered By: Midge Aver on 02/04/2023 14:21:00 -------------------------------------------------------------------------------- Lower Extremity Assessment Details Patient Name: Date of Service: [name], [name] 02/04/2023 1:15 PM Medical Record Number: [id number] Patient Account Number: [id number] Date of Birth/Sex: Treating RN: 1946/04/13 (77 y.o. [name] Primary Care Rhianna Raulerson: [name] Other Clinician: Referring Kista Robb: Treating Donda Friedli/Extender: Hermine Messick Weeks in Treatment: 32 Edema Assessment Assessed: Kyra Searles: Yes] Franne Forts: No] [Left: Edema] Franne Forts: Marland Kitchen] F[LeftVallarie Mare ([id number])] [Right: [id number] Page 3 of 7] Calf Left: Right: Point of Measurement: 33 cm From Medial Instep 41 cm Ankle Left: Right: Point of Measurement: 11 cm From Medial Instep  22.5 cm Knee To Floor Left: Right: From Medial Instep 42 cm Vascular Assessment Pulses: Dorsalis Pedis Palpable: [Left:Yes] Electronic Signature(s) Signed: 02/04/2023 4:52:52 PM By: Midge Aver MSN RN CNS WTA Entered By: Midge Aver on 02/04/2023 14:02:23 -------------------------------------------------------------------------------- Multi Wound Chart Details Patient Name: Date of Service: [name], [name] 02/04/2023 1:15 PM Medical Record Number: [id number] Patient Account Number: [id number] Date of Birth/Sex: Treating RN: 03-29-1946 (77 y.o. [name] Primary Care Tanicka Bisaillon: [name] Other Clinician: Referring Browning Southwood: Treating Aiyla Baucom/Extender: Hermine Messick Weeks in Treatment: 32 Vital Signs Height(in): 70 Pulse(bpm): 69 Weight(lbs): 265 Blood Pressure(mmHg): 122/65 Body Mass Index(BMI): 38 Temperature(F): 97.8 Respiratory Rate(breaths/min): 16 [10:Photos:] [N/A:N/A] Left Calcaneus N/A N/A Wound Location: Gradually Appeared N/A N/A Wounding 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 N/A N/A Secondary Etiology: Cataracts, Arrhythmia, Coronary N/A N/A Comorbid History: Artery Disease, Hypertension, Type II Diabetes, Osteoarthritis, Neuropathy 06/05/2022 N/A N/A Date Acquired: 23 N/A N/A Weeks of Treatment: Open N/A N/A Wound Status: No N/A N/A Wound Recurrence: 1.3x2.7x0.1 N/A N/A Measurements L x W x D (cm) 2.757 N/A N/A A (cm) : rea 0.276 N/A N/A Volume (cm) : -95.00% N/A N/A % Reduction in A rea: 2.50% N/A N/A % Reduction in Volume: Grade 1 N/A N/A Classification: Medium N/A N/A Exudate A mount: Serosanguineous N/A N/A Exudate Type: red, brown N/A N/A Exudate ColorFEDOR, KAZMIERSKI ([id number]) [id number] Page 4 of 7 Flat and Intact N/A N/A Wound Margin: Small (1-33%) N/A N/A Granulation Amount: Pink N/A N/A Granulation  Quality: Large (67-100%) N/A N/A Necrotic Amount: Fat Layer (Subcutaneous Tissue): Yes N/A N/A Exposed Structures: Fascia: No Tendon: No Muscle: No Joint: No Bone: No Small (1-33%) N/A N/A Epithelialization: Treatment Notes Electronic Signature(s) Signed: 02/04/2023 4:52:52 PM By: Midge Aver MSN RN CNS WTA Entered By: Midge Aver on 02/04/2023 14:15:08 -------------------------------------------------------------------------------- Multi-Disciplinary Care Plan Details Patient Name: Date of Service: [name], [name] 02/04/2023 1:15 PM Medical Record Number: [id number] Patient Account Number: [id number] Date of Birth/Sex: Treating RN: 02-19-1946 (77 y.o. [name] Primary Care Namiko Pritts: [name] Other Clinician: Referring Mayrene Bastarache: Treating Altovise Wahler/Extender: Hermine Messick Weeks in Treatment: [phone] Active  Inactive Wound/Skin Impairment Nursing Diagnoses: Impaired tissue integrity Goals: Patient/caregiver will verbalize understanding of skin care regimen Date Initiated: 06/19/2022 Date Inactivated: 07/10/2022 Target Resolution Date: 06/19/2022 Goal Status: Met Ulcer/skin breakdown will have a volume reduction of 30% by week 4 Date Initiated: 06/19/2022 Date Inactivated: 07/28/2022 Target Resolution Date: 07/17/2022 Goal Status: Met Ulcer/skin breakdown will have a volume reduction of 50% by week 8 Date Initiated: 07/28/2022 Target Resolution Date: 02/17/2023 Goal Status: Active Interventions: Assess patient/caregiver ability to obtain necessary supplies Assess patient/caregiver ability to perform ulcer/skin care regimen upon admission and as needed Assess ulceration(s) every visit Provide education on ulcer and skin care Treatment Activities: Skin care regimen initiated : 06/19/2022 Topical wound management initiated : 06/19/2022 Notes: Electronic Signature(s) Signed: 02/04/2023 4:52:52 PM By: Midge Aver MSN RN CNS WTA Entered By: Midge Aver on  02/04/2023 14:19:15 -------------------------------------------------------------------------------- Pain Assessment Details Patient Name: Date of Service: [name], [name] 02/04/2023 1:15 PM Medical Record Number: [id number] Patient Account Number: [id number] Date of Birth/Sex: Treating RN: Feb 02, 1946 (77 y.o. [name] Primary Care Val Schiavo: [name] Other Clinician: ZYON, ROSEBUSH ([id number]) [id number] Page 5 of 7 Referring Xan Ingraham: Treating Karryn Kosinski/Extender: [name] in Treatment: 32 Active Problems Location of Pain Severity and Description of Pain Patient Has Paino No Site Locations Pain Management and Medication Current Pain Management: Electronic Signature(s) Signed: 02/04/2023 4:52:52 PM By: Midge Aver MSN RN CNS WTA Entered By: Midge Aver on 02/04/2023 13:56:05 -------------------------------------------------------------------------------- Patient/Caregiver Education Details Patient Name: Date of Service: [name], GEO Cherrie Gauze 4/18/2024andnbsp1:15 PM Medical Record Number: [id number] Patient Account Number: [id number] Date of Birth/Gender: Treating RN: [birth year] (77 y.o. [name] Primary Care Physician: [name] Other Clinician: Referring Physician: Treating Physician/Extender: [name] in Treatment: 32 Education Assessment Education Provided To: Patient Education Topics Provided Wound/Skin Impairment: Handouts: Caring for Your Ulcer Methods: Explain/Verbal Responses: State content correctly Electronic Signature(s) Signed: 02/04/2023 4:52:52 PM By: Midge Aver MSN RN CNS WTA Entered By: Midge Aver on 02/04/2023 14:19:26 -------------------------------------------------------------------------------- Wound Assessment Details Patient Name: Date of Service: [name], [name] 02/04/2023 1:15 PM Medical Record Number: [id number] Patient Account  Number: [id number] Date of Birth/Sex: Treating RN: [birth year] (77 y.o. [name] Primary Care Zeina Akkerman: [name] Other Clinician: Referring Pavneet Markwood: Treating Darry Kelnhofer/Extender: Shem, Klouda ([id number]) [id number] Page 6 of 7 Weeks in Treatment: 32 Wound Status Wound Number: 10 Primary Diabetic Wound/Ulcer of the Lower Extremity Etiology: Wound Location: Left Calcaneus Secondary Pressure Ulcer Wounding Event: Gradually Appeared Etiology: Date Acquired: 06/05/2022 Wound Open Weeks Of Treatment: 32 Status: Clustered Wound: No Comorbid Cataracts, Arrhythmia, Coronary Artery Disease, Hypertension, History: Type II Diabetes, Osteoarthritis, Neuropathy Photos Wound Measurements Length: (cm) 1.3 Width: (cm) 2.7 Depth: (cm) 0.1 Area: (cm) 2.757 Volume: (cm) 0.276 % Reduction in Area: -95% % Reduction in Volume: 2.5% Epithelialization: Small (1-33%) Wound Description Classification: Grade 1 Wound Margin: Flat and Intact Exudate Amount: Medium Exudate Type: Serosanguineous Exudate Color: red, brown Foul Odor After Cleansing: No Slough/Fibrino Yes Wound Bed Granulation Amount: Small (1-33%) Exposed Structure Granulation Quality: Pink Fascia Exposed: No Necrotic Amount: Large (67-100%) Fat Layer (Subcutaneous Tissue) Exposed: Yes Necrotic Quality: Adherent Slough Tendon Exposed: No Muscle Exposed: No Joint Exposed: No Bone Exposed: No Treatment Notes Wound #10 (Calcaneus) Wound Laterality: Left Cleanser Peri-Wound Care Topical Gentamicin Discharge Instruction: Apply as directed by Arzella Rehmann. Primary Dressing Hydrofera Blue Ready Transfer Foam, 2.5x2.5 (in/in) Discharge Instruction: Apply Hydrofera Blue Ready to wound bed  as directed Secondary Dressing ABD Pad 5x9 (in/in) Discharge Instruction: Cover with ABD pad Secured With Kerlix Roll Sterile or Non-Sterile 6-ply 4.5x4 (yd/yd) Discharge  Instruction: Apply Kerlix as directed Tubigrip Size D, 3x10 (in/yd) Compression [address] JORDON, KRISTIANSEN ([id number]) [id number] Page 7 of 7 Compression Stockings Add-Ons Electronic Signature(s) Signed: 02/04/2023 4:52:52 PM By: Midge Aver MSN RN CNS WTA Entered By: Midge Aver on 02/04/2023 14:00:51 -------------------------------------------------------------------------------- Vitals Details Patient Name: Date of Service: [name], [name] 02/04/2023 1:15 PM Medical Record Number: [id number] Patient Account Number: [id number] Date of Birth/Sex: Treating RN: 23-Nov-1945 (77 y.o. [name] Primary Care Taheera Thomann: [name] Other Clinician: Referring Dominion Kathan: Treating Soma Bachand/Extender: Hermine Messick Weeks in Treatment: 32 Vital Signs Time Taken: 13:53 Temperature (F): 97.8 Height (in): 70 Pulse (bpm): 69 Weight (lbs): 265 Respiratory Rate (breaths/min): 16 Body Mass Index (BMI): 38 Blood Pressure (mmHg): 122/65 Reference Range: 80 - 120 mg / dl Electronic Signature(s) Signed: 02/04/2023 4:52:52 PM By: Midge Aver MSN RN CNS WTA Entered By: Midge Aver on 02/04/2023 13:55:59

## 2023-02-04 NOTE — Progress Notes (Addendum)
NATHANIEL, WAKELEY ([id number]) [id number] Page 1 of 11 Visit Report for 02/04/2023 Chief Complaint Document Details Patient Name: Date of Service: [name], GEO [facility] J. 02/04/2023 1:15 PM Medical Record Number: [id number] Patient Account Number: [id number] Date of Birth/Sex: Treating RN: April 25, 1946 (77 y.o. [name] Primary Care Provider: [name] Other Clinician: Referring Provider: Treating Provider/Extender: Hermine Messick Weeks in Treatment: 32 Information Obtained from: Patient Chief Complaint Left heel ulcer Electronic Signature(s) Signed: 02/04/2023 1:36:06 PM By: [name] Derry PA-C Entered By: [name] Derry on 02/04/2023 13:36:06 -------------------------------------------------------------------------------- Debridement Details Patient Name: Date of Service: [name], [name] 02/04/2023 1:15 PM Medical Record Number: [id number] Patient Account Number: [id number] Date of Birth/Sex: Treating RN: [birth year] (77 y.o. [name] Primary Care Provider: [name] Other Clinician: Referring Provider: Treating Provider/Extender: [name] in Treatment: 32 Debridement Performed for Assessment: Wound #10 Left Calcaneus Performed By: Physician [name] Derry, PA-C Debridement Type: Debridement Severity of Tissue Pre Debridement: Fat layer exposed Level of Consciousness (Pre-procedure): Awake and Alert Pre-procedure Verification/Time Out Yes - 14:16 Taken: Start Time: 14:16 T Area Debrided (L x W): otal 1.3 (cm) x 2.7 (cm) = 3.51 (cm) Tissue and other material debrided: Viable, Non-Viable, Slough, Subcutaneous, Biofilm, Slough Level: Skin/Subcutaneous Tissue Debridement Description: Excisional Instrument: Curette Bleeding: Minimum Hemostasis Achieved: Pressure Procedural Pain: 0 Post Procedural Pain: 0 Response to Treatment: Procedure was tolerated well Level of Consciousness (Post- Awake and  Alert procedure): Post Debridement Measurements of Total Wound Length: (cm) 1.3 Width: (cm) 2.7 Depth: (cm) 0.2 Volume: (cm) 0.551 Character of Wound/Ulcer Post Debridement: Stable Severity of Tissue Post Debridement: Fat layer exposed Post Procedure Diagnosis Same as Pre-procedure Electronic Signature(s) Signed: 02/04/2023 4:52:52 PM By: Midge Aver MSN RN CNS WTA Signed: 02/05/2023 1:44:50 PM By: [name] Derry PA-C Entered By: Midge Aver on 02/04/2023 14:18:10 Barrie Folk ([id number]) [id number] Page 2 of 11 -------------------------------------------------------------------------------- HPI Details Patient Name: Date of Service: [name], [name] 02/04/2023 1:15 PM Medical Record Number: [id number] Patient Account Number: [id number] Date of Birth/Sex: Treating RN: 1946/09/04 (76 y.o. [name] Primary Care Provider: [name] Other Clinician: Referring Provider: Treating Provider/Extender: Hermine Messick Weeks in Treatment: 32 History of Present Illness HPI Description: 09/24/2020 on evaluation today patient presents today for a heel ulcer that he tells me has been present for about 2 years. He has been seeing podiatry and they have been attempting to manage this including what sounds to be a total contact cast, Unna boot, and just standard dressings otherwise as well. Most recently has been using triple antibiotic ointment. With that being said unfortunately despite everything he really has not had any significant improvement. He tells me that he cannot even really remember exactly how this began but he presumed it may have rubbed on his shoes or something of that nature. 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 He also does have chronic pain in the way of  neuropathy which he takes medications for including Cymbalta and methadone. He tells me that this does seem to help. Fortunately there is no signs of active infection at this time. His most recent hemoglobin A1c was 8.1 though he knows this was this year he cannot tell me the exact time. His fluid pills currently to help with some of the lower  extremity edema although he does obviously have signs of venous stasis/lymphedema. Currently there is no evidence of active infection. No fevers, chills, nausea, vomiting, or diarrhea. Patient has had fairly recent ABIs which were performed on 07/19/2020 and revealed that he has normal findings in both the ankle and toe locations bilaterally. His ABI on the right was 1.09 on the left was 1.08 with a TBI on the right of 0.88 and on the left of 0.94. Triphasic flow was noted throughout. 10/08/2020 on evaluation today patient appears to be doing pretty well in regard to his left heel currently in fact this is doing a great job and seems to be healing quite nicely. Unfortunately on his right leg he had a pile of wood that actually fell on him injuring his right leg this is somewhat erythematous has me concerned little bit about cellulitis though there is not really a good area to culture at this point. 10/24/2020 upon evaluation today patient appears to be doing well with regard to his heel ulcer. He is showing signs of improvement which is great news. His right leg is completely healed. Overall I feel like he is doing excellent and there is no signs of infection. 11/07/2020 upon evaluation today patient appears to be doing well with regard to his heel ulcer. He tells me that last week when he was unable to come in his wife actually thought that the wound was very close to closing if not closed. Then it began to "reopen again". I really feel like what may have happened as the collagen may have dried over the wound bed and that because that misunderstanding with thinking  that the wound was healing. With that being said I did not see it last week I do not know that for certain. Either way I feel like he is doing great today I see no signs of infection at this point. 11/26/2020 upon inspection today patient appears to be doing decently well in regard to his heel ulcer. He has been tolerating the dressing changes without complication. Fortunately there is no sign of active infection at this time. No fevers, chills, nausea, vomiting, or diarrhea. 12/10/2020 upon evaluation today patient appears to be doing fairly well in regard to the wound on his heel as well as what appears to be a new wound of the left first metatarsal head plantar aspect. This seems to be an area that was callus that has split as the patient tells me has been trying to walk on his toes more has probably where this came from. With that being said there does not appear to be signs of active infection which is great news. 12/17/2020 upon evaluation today patient actually appears to be making good progress currently. Fortunately there is no evidence of active infection at this time. Overall I feel like he is very close to complete closure. 12/26/2020 upon evaluation today patient appears to be doing excellent in regard to his wounds. In fact I am not certain that these are not even completely healed on initial inspection. Overall I am very pleased with where things stand today. Good news is that they are healed he is actually get ready to go out of town and that will be helpful as well as he will be a full part of the time. Readmission: 02/04/2021 upon evaluation today patient appears to be doing well at this point in regard to his left heel that I previously saw him for. Unfortunately he is having issues with his right  lower extremity. He has significant wounds at this point he also has erythema noted there is definite signs of cellulitis which is unfortunate. With that being said I think we do need to address  this sooner rather than later. The good news is he did have a nice trip to the beach. He tells me that he had no issues during that time. 4/27; patient on Bactrim. Culture showed methicillin sensitive staph aureus therefore the Bactrim should be effective. 2 small areas on the right leg are healed the area on the mid aspect of the tibia almost 100% covered in a very adherent necrotic debris. We have been using silver alginate 02/20/2021 upon evaluation today patient appears to be doing well with regard to his wound. He is showing signs of improvement which is great news overall very pleased with where things stand today. No fevers, chills, nausea, vomiting, or diarrhea. 02/27/2021 upon evaluation today patient appears to be doing well with regard to his leg ulcer. He is tolerating the dressing changes and overall appears to be doing quite excellent. I am extremely pleased with where things stand and overall I think patient is making great progress. There is no sign of active infection at this time which is also great news. 03/06/2021 upon evaluation today patient appears to be doing excellent in regard to his wounds. In fact he appears to be completely healed today based on what I am seeing. This is excellent news and overall I am extremely pleased with where he stands. Overall the patient is happy to hear this as well this has been quite sometime coming. Readmission: 04/01/2021 patient unfortunately returns for readmission today. He tells me that he has been wearing his compression socks on the right leg daily and he does not really know what is going on and why his legs are doing what they are doing. We did therefore go ahead and probe deeper into exactly what has been going on with him today. Subsequently the patient tells me that when he gets up and what we would call "first thing in the morning" are really 2 different things". He does not tend to sleep well so he tells me that he will often wake up  at 3:00 in the morning. He will then potentially going into the living room to get in his chair where he may read a book for a little while and then potentially fall asleep back in his chair. He then subsequently wake up around 5:00 or so and then get up and in his words "putter around". This often will end with him proceeding at some point around 8 AM or 9 AM to putting on his compression socks. With that being said this means that anywhere from the 3:00 in the morning till roughly around 9:00 in the morning he has no compression on yet he is sitting in his recliner, walking around and up and about, and this is at least 5 to 6 hours of noncompressed time. That may be our issue here but I did not realize until we discussed this further today. Fortunately there does not appear to be any signs of active infection at this time which is great news. With that being said he has multiple wounds of the bilateral lower extremities. 04/10/2021 upon evaluation today patient appears to be doing well with regard to his legs for the most part. He does look like he had some injury where his wrap slid down nonetheless he did some "doctoring on them". I do  feel like most of the areas honestly have cleared back up which is great news. There does not ESTEFANO, VICTORY ([id number]) [id number] Page 3 of 11 appear to be any signs of active infection at this time which is also great news. No fevers, chills, nausea, vomiting, or diarrhea. 04/18/2021 upon evaluation today patient appears to be doing well with regard to his wounds in fact he appears to be completely healed which is great news. Fortunately there does not appear to be any signs of active infection which is great news. No fevers, chills, nausea, vomiting, or diarrhea. READMISSION 07/28/2021 This is a now 77 year old man we have had in this clinic for quite a bit of this year. He has been in here with bilateral lower extremity leg wounds  probably secondary to chronic venous insufficiency. He wears compression stockings. 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 He comes in an old running shoes although he says he wears better shoes at home. His history is that he noticed a blister in the right posterior heel. This open. He has been applying Neosporin to it currently the wound measures 2 x 1.4 cm. 100% slough covered. Not really offloading this in any rigorous way. Past medical history is essentially unchanged he has paroxysmal atrial fibrillation on Coumadin type 2 diabetes with a recent hemoglobin A1c of 7 on metformin. ABI in our clinic was 0.98 on the right 08/05/2021 upon evaluation today patient appears to be doing well with regard to his heel ulcer. Fortunately there does not appear to be any signs of active infection at this time. Overall I been very pleased with where things seem to be currently as far as the wound healing is concerned. Again this is first a lot of seeing him Dr. Leanord Hawking readmitted him last week. Nonetheless I think we are definitely making some progress here. 08/11/2021 upon evaluation today patient's wound on the heel actually showing signs of excellent improvement. I am actually very pleased with where things stand currently. No fevers, chills, nausea, vomiting, or diarrhea. There does not appear to be any need for sharp debridement today either which is also great news. 08/25/2021 upon evaluation today patient appears to be doing well with regard to his heel ulcer. This is actually looking significantly improved compared to last time I saw him. Fortunately there does not appear to be any evidence of active infection at this time. No fevers, chills, nausea, vomiting, or diarrhea. 09/01/2021 upon evaluation today patient appears to be doing decently well in regard to his wounds. Fortunately there does not appear to be any signs of active infection at this time which  is great news and overall very pleased in that regard. I do not see any evidence of active infection systemically which is great news as well. No fevers, chills, nausea, vomiting, or diarrhea. I think that the heel is making excellent progress. 09/15/2021 upon evaluation today patient's heel unfortunately is significantly worse compared to what it was previous. I do believe that at this time he would benefit from switching back to something a little bit more offloading from the cushion shoe he has right now this is more of a heel protector what I really need is a Prevalon offloading boot which I think is good to do a lot better for him than just the small heel protector. 09/23/2021 upon evaluation today patient appears to be doing a little better in regards to last week's visit. Overall I think that he  is making good progress which is great news and there does not appear to be any evidence of active infection at this time. No fevers, chills, nausea, vomiting, or diarrhea. 09/30/2021 upon evaluation patient's heel is actually showing signs of good improvement which is great news. Fortunately there does not appear to be any evidence of active infection locally nor systemically at this point. No fevers, chills, nausea, vomiting, or diarrhea. 10/07/2021 upon evaluation today patient appears to be doing well currently in regard to his heel ulcer. He has been tolerating the dressing changes without complication. Fortunately I do not see any signs of active infection at this time which is great news. No fevers, chills, nausea, vomiting, or diarrhea. 12/27; wound is measuring smaller. We have been using silver alginate 10/28/2021 upon evaluation today patient appears to be doing well currently in regard to his heel ulcer. I am actually very pleased with where things stand and I think he is making excellent progress. Fortunately I do not see any signs of active infection locally nor systemically at this point  which is great news. Nonetheless I do believe that the patient would benefit from a new offloading shoe and has been using it Velcro is no longer functioning properly and he tells me that he almost tripped and fell because of that today. Obviously I do not want him following that would be very bad. 11/11/2021 upon evaluation today patient appears to be doing excellent in regard to his wound. In fact this appears to be completely healed based on what I see currently. I do not see any signs of anything open or draining and I did double check just by clearing away some of the callus around the edges of the wound to ensure that there was nothing still open or hiding underneath. Once this was cleared away it appeared that the patient was doing significantly better at this time which is great news and there was nothing actually open. Readmission: 06-19-2022 upon evaluation today patient appears to be doing well currently in regard to his heel ulcer all things considered. He unfortunately is having a bit of breakdown in general as far as the heel is concerned not nearly as bad as what we noted last time he was here in the clinic. The good news is I do think that this should hopefully heal much more rapidly than what we noted last time. His past medical history has not changed he is on Coumadin. 07-03-2022 upon evaluation today patient appears to be doing better in regard to his heel. We will start to see some improvement here which is good news. Fortunately I do not see any evidence of infection locally or systemically at this time which is great news. No fevers, chills, nausea, vomiting, or diarrhea. 07-10-2022 upon evaluation today patient appears to be doing well currently in regard to his wound from the callus standpoint around the edges. Unfortunately from an actual wound bed standpoint he has some deep tissue injury noted at this point. Again I am not sure exactly what is going on that is causing  this pressure to the region but he is definitely gotten pressure that is occurring and causing this to not heal as effectively as what we would like to see. I discussed with him today that he may need to at night when he sleeping use a pillow up underneath his calf in order to prevent the heel from touching the bed at all. I also think that it may be beneficial for  him to monitor throughout his day and make sure there is no other time when he is getting the pressure to the heel. 07-23-2022 upon evaluation today patient appears to be doing poorly currently in regard to his heel ulcer. He has been tolerating the dressing changes without complication. Unfortunately I feel like he may be infected based on what I am seeing. 07-28-2022 I did review patient's culture results which showed positive for Proteus as well as Staphylococcus both of which would be treated with the Bactrim DS that I gave him at the last visit. This is good news and hopefully means that we should be able to apply the cast today as long as the wound itself does not appear to be doing poorly. Patient's wound bed actually showed signs of doing quite well and I am very pleased with where things stand and I do not see any signs of worsening overall which is great news. 08-04-2022 upon evaluation today patient appears to be doing well currently in regard to his heel ulcer. I am actually very pleased with where things stand and I do think this is looking significantly better. With regard to his right shin that is also showing signs of improvement which is great news. 08-10-2022 upon evaluation today patient's wound on the heel actually appears to be doing significantly better. In regard to the right anterior shin this is showing signs of healing it might even be completely healed but I still get a monitor I cannot get anything to fill away but also could not see where there was anything actually draining at this point. I am tending to think  healed but I want a monitor 1 more week before I call it for sure. 08-17-2022 upon evaluation today patient appears to be doing well currently in regard to his heel. Fortunately he is tolerating the dressing changes without complication. I do not see any signs of infection and overall I think he is making good progress here. We did get approval for the Apligraf but I think he had a $295 co-pay that would have to be paid per application. With that being said as good as he is doing right now I do not think that is necessary but is still an TERRICK, ALLRED ([id number]) [id number] Page 4 of 11 option depending on how things progress if we need to speed this up quite a bit. 08/24/2022; this patient has a wound on his left heel in the setting of type 2 diabetes. We have been using Prisma. He has a heel offloading boot 08-31-2022 upon evaluation today patient appears to be doing poorly in regard to his heel ulcer which is still showing signs of bruising I am just not as happy as I was when I saw him 2 weeks ago with this. He saw Dr. Leanord Hawking last week. Also did not look good at that point apparently. Nonetheless I think that the patient is still continuing to have some counterpressure getting to the wound bed unfortunately. 09-07-2022 upon evaluation today patient appears to be doing well currently in regard to his heel ulcer. He has been tolerating the dressing changes without complication. Fortunately there does not appear to be any signs of active infection locally or systemically at this time. Fortunately I do not see any evidence of active infection at this time. 09-15-2022 upon evaluation today patient appears to be doing well currently in regard to his legs which in fact appear to be completely healed this is great news. With that being  said his heel does have some erythema and warmth as well as drainage I am concerned about cellulitis at this location. 09-21-2022 upon  evaluation today patient's wound is actually showing signs of being a little bit smaller but still we are not doing nearly as well as what I would like to see. Fortunately I do not see any signs of infection at this time which is great news and overall I am extremely pleased in that regard. With that being said I do think that he needs to continue to elevate his leg although the edema is under great control with a compression wrap I think switching to a different dressing topically may be beneficial. My suggestion is to switch him over to a Iodoflex dressing. 09-28-2022 upon evaluation today patient appears to be doing well with regard to his heel ulcer. This is actually showing signs of improvement which is great news and overall I am extremely pleased with where we stand today. 10-06-2022 upon evaluation today patient actually appears to be making excellent progress at this point. Fortunately there does not appear to be any signs of active infection locally nor systemically which is great news and overall I am extremely pleased with where we stand. I do feel like he is actually making some pretty good progress here as we switch to the wrap along with the Iodoflex. 12/26; patient has a wound on the tip of his left heel. This is not a weightbearing surface. He is using a heel offloading boot and carefully offloading this at other times during the day. He is using Iodoflex and Zetuvitunder 3 layer compression 10-22-2022 upon evaluation today patient appears to be doing well currently in regard to his wound. He has been tolerating the dressing changes without complication. Fortunately there does not appear to be any signs of active infection locally nor systemically at this time. No fevers, chills, nausea, vomiting, or diarrhea. 08-30-2023 upon evaluation today patient appears to be doing well currently in regard to his heel ulcer which is showing signs of good improvement. Fortunately I do not see any  evidence of infection locally nor systemically which is great news and overall I am extremely pleased with where we stand. No fevers, chills, nausea, vomiting, or diarrhea. Unfortunately he does have a wound on the side of his fifth toe where I think this did rub on the shoe. 11-05-2022 upon evaluation today patient still still showing some signs of pressure at this point and there may have even been some fluid collection at this time. Fortunately I do not see any evidence of active infection locally nor systemically which is great news and overall I am extremely pleased with where we stand. No fevers, chills, nausea, vomiting, or diarrhea. 1/25; the patient's area on the dorsal aspect of the left fifth toe is healed. We have been using Iodoflex to the area on the left heel. The lateral wound is slightly down in length. 11-19-2022 upon evaluation today patient appears to be doing well currently in regard to his wound. He has been tolerating the dressing changes without complication. Fortunately there does not appear to be any signs of infection we been using Iodoflex up to this point. 11-26-2022 upon evaluation today patient appears to be doing poorly currently in regard to his wound. He has been tolerating the dressing changes with the Iodoflex without complication. With that being said he does not have any signs of active infection systemically though locally I do feel like there is some evidence of  infection here. 12-03-2022 upon evaluation today patient appears to be doing well currently in regard to his wound this is better with the Bactrim compared to last week still there was some significant slough and biofilm buildup which is going require sharp debridement at this point. Fortunately I do not see any signs of active infection locally nor systemically which is great news. 12-11-2022 upon evaluation today patient actually appears to be doing significantly better at this time in regard to his heel. I  am actually very pleased with where things stand currently. There is no signs of active infection locally or systemically at this point. 12-17-2022 upon evaluation today patient appears to be doing better as far as the overall appearance of his wound. I am actually very pleased in that regard. Fortunately there does not appear to be any signs of active infection locally nor systemically at this time. I do feel like that the patient is showing signs of excellent improvement in general. Nonetheless he needs something better for compression I think he should go ahead and see about getting compression socks. 3/7; tip of the left heel measurements are smaller he has been using Hydrofera Blue 01-07-2023 upon evaluation today patient appears to be doing well currently in regard to his wound. This has not been debrided since I last saw him and it definitely shows that definitely needs some debridement today. Fortunately I do not see any evidence of active infection locally nor systemically which is great news. 01-14-2023 upon evaluation today patient appears to be doing well currently although he does have some erythema and warmth to the heel I feel like he may have developed an infection yet again. This is something that is been ongoing issue we have been trying to manage his foot best we could. With that being said I do think he may need to go back on the Bactrim this has done well for him in the past. 01-21-2023 upon evaluation today patient appears to be doing better in regard to his heel. He is doing very well with the antibiotics and that is made a big difference for him based on what I am seeing I am very pleased with where we stand today. He is obviously doing extremely well which is great news. 01-28-2023 upon evaluation today patient appears to be doing well currently in regard to his wound. This is actually showing signs of excellent improvement I am actually very pleased with where we stand and I do  believe that he is making good progress here. Fortunately I do not see any evidence of active infection locally nor systemically which is great news. No fevers, chills, nausea, vomiting, or diarrhea. 02-04-2023 upon evaluation today patient's wound is actually showed signs of excellent improvement I am actually very pleased with where we stand. I do not see any signs of active infection locally nor systemically which is great news and in general I do believe that we are moving in the right direction here. No fevers, chills, nausea, vomiting, or diarrhea. Electronic Signature(s) Signed: 02/05/2023 1:21:56 PM By: [name] Derry PA-C Entered By: [name] Derry on 02/05/2023 13:21:55 Barrie Folk ([id number]) [id number] Page 5 of 11 -------------------------------------------------------------------------------- Physical Exam Details Patient Name: Date of Service: [name], [name] 02/04/2023 1:15 PM Medical Record Number: [id number] Patient Account Number: [id number] Date of Birth/Sex: Treating RN: Feb 10, 1946 (77 y.o. [name] Primary Care Provider: [name] Other Clinician: Referring Provider: Treating Provider/Extender: Hermine Messick Weeks in Treatment: 32 Constitutional Well-nourished  and well-hydrated in no acute distress. Respiratory normal breathing without difficulty. Psychiatric this patient is able to make decisions and demonstrates good insight into disease process. Alert and Oriented x 3. pleasant and cooperative. Notes Upon inspection patient's wound bed showed signs of good granulation epithelization at this point. Fortunately I do not see any evidence of active infection locally nor systemically which is great news and I did have to perform debridement but fortunately this was not too significant today it actually was the least of how to do in a long time. Electronic Signature(s) Signed: 02/05/2023 1:22:20 PM By: [name] Derry PA-C Entered By: [name] Derry on 02/05/2023 13:22:20 -------------------------------------------------------------------------------- Physician Orders Details Patient Name: Date of Service: [name], [name] 02/04/2023 1:15 PM Medical Record Number: [id number] Patient Account Number: [id number] Date of Birth/Sex: Treating RN: 20-[name]-1946 (77 y.o. 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 Bathing/ Shower/ Hygiene May shower; gently cleanse wound with antibacterial soap, rinse and pat dry prior to dressing wounds Non-Wound Condition dditional non-wound orders/instructions: - Apply 40% Urea cream to dry cracked skin on feet. A Off-Loading Open toe surgical shoe with peg assist. Medications-Please add to medication list. ntibiotics - Start Bactrim P.O. A Wound Treatment Wound #10 - Calcaneus Wound Laterality: Left Topical: Gentamicin 3 x Per Week/30 Days Discharge Instructions: Apply as directed by provider. Prim Dressing: Hydrofera Blue Ready Transfer Foam, 2.5x2.5 (in/in) (Dispense As Written) 3 x Per Week/30 Days ary AMAR, SIPPEL ([id number]) [id number] Page 6 of 11 Discharge Instructions: Apply Hydrofera Blue Ready to wound bed as directed Secondary Dressing: ABD Pad 5x9 (in/in) (Generic) 3 x Per Week/30 Days Discharge Instructions: Cover with ABD pad Secured With: Kerlix Roll Sterile or Non-Sterile 6-ply 4.5x4 (yd/yd) 3 x Per Week/30 Days Discharge Instructions: Apply  Kerlix as directed Secured With: Tubigrip Size D, 3x10 (in/yd) 3 x Per Week/30 Days Electronic Signature(s) Signed: 02/04/2023 4:52:52 PM By: Midge Aver MSN RN CNS WTA Signed: 02/05/2023 1:44:50 PM By: [name] Derry PA-C Entered By: Midge Aver on 02/04/2023 14:18:47 -------------------------------------------------------------------------------- Problem List Details Patient Name: Date of Service: [name], [name] 02/04/2023 1:15 PM Medical Record Number: [id number] Patient Account Number: [id number] Date of Birth/Sex: Treating RN: August 26, 1946 (77 y.o. [name] Primary Care Provider: [name] Other Clinician: Referring Provider: Treating Provider/Extender: Hermine Messick Weeks in Treatment: 32 Active Problems ICD-10 Encounter Code Description Active Date MDM Diagnosis E11.621 Type 2 diabetes mellitus with foot ulcer 06/19/2022 No Yes 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sed 06/19/2022 No Yes E11.42 Type 2 diabetes mellitus with diabetic polyneuropathy 06/19/2022 No Yes Inactive Problems Resolved Problems Electronic Signature(s) Signed: 02/04/2023 4:52:52 PM By: Midge Aver MSN RN CNS WTA Signed: 02/05/2023 1:44:50 PM By: [name] Derry PA-C Previous Signature: 02/04/2023 1:36:02 PM Version By: [name] Derry PA-C Entered By: Midge Aver on 02/04/2023 14:19:54 -------------------------------------------------------------------------------- Progress Note Details Patient Name: Date of Service: [name], [name] 02/04/2023 1:15 PM Medical Record Number: [id number] Patient Account Number: [id number] Date of Birth/Sex: Treating RN: 1945/12/30 (77 y.o. [name] Primary Care Provider:  [name] Other Clinician: Referring Provider: Treating Provider/Extender: [name] in Treatment: 32 Subjective Chief Complaint Information obtained from Patient Left heel ulcer DIAN, MINAHAN  ([id number]) [id number] Page 7 of 11 History of Present Illness (HPI) 09/24/2020 on evaluation today patient presents today for a heel ulcer that he tells me has been present for about 2 years. He has been seeing podiatry and they have been attempting to manage this including what sounds to be a total contact cast, Unna boot, and just standard dressings otherwise as well. Most recently has been using triple antibiotic ointment. With that being said unfortunately despite everything he really has not had any significant improvement. He tells me that he cannot even really remember exactly how this began but he presumed it may have rubbed on his shoes or something of that nature. 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 He tells me that this does seem to help. Fortunately there is no signs of active infection at this time. His most recent hemoglobin A1c was 8.1 though he knows this was this year he cannot tell me the exact time. His fluid pills currently to help with some of the lower extremity edema although he does obviously have signs of venous stasis/lymphedema. Currently there is no evidence of active infection. No fevers, chills, nausea, vomiting, or diarrhea. Patient has had fairly recent ABIs which were performed on 07/19/2020 and revealed that he has normal findings in both the ankle and toe locations bilaterally. His ABI on the right was 1.09 on the left was 1.08 with a TBI on the right of 0.88 and on the left of 0.94. Triphasic flow was noted throughout. 10/08/2020 on evaluation today patient appears to be doing pretty well in regard to his left heel currently in fact this is doing a great  job and seems to be healing quite nicely. Unfortunately on his right leg he had a pile of wood that actually fell on him injuring his right leg this is somewhat erythematous has me concerned little bit about cellulitis though there is not really a good area to culture at this point. 10/24/2020 upon evaluation today patient appears to be doing well with regard to his heel ulcer. He is showing signs of improvement which is great news. His right leg is completely healed. Overall I feel like he is doing excellent and there is no signs of infection. 11/07/2020 upon evaluation today patient appears to be doing well with regard to his heel ulcer. He tells me that last week when he was unable to come in his wife actually thought that the wound was very close to closing if not closed. Then it began to "reopen again". I really feel like what may have happened as the collagen may have dried over the wound bed and that because that misunderstanding with thinking that the wound was healing. With that being said I did not see it last week I do not know that for certain. Either way I feel like he is doing great today I see no signs of infection at this point. 11/26/2020 upon inspection today patient appears to be doing decently well in regard to his heel ulcer. He has been tolerating the  dressing changes without complication. Fortunately there is no sign of active infection at this time. No fevers, chills, nausea, vomiting, or diarrhea. 12/10/2020 upon evaluation today patient appears to be doing fairly well in regard to the wound on his heel as well as what appears to be a new wound of the left first metatarsal head plantar aspect. This seems to be an area that was callus that has split as the patient tells me has been trying to walk on his toes more has probably where this came from. With that being said there does not appear to be signs of active infection which is great news. 12/17/2020 upon evaluation today patient  actually appears to be making good progress currently. Fortunately there is no evidence of active infection at this time. Overall I feel like he is very close to complete closure. 12/26/2020 upon evaluation today patient appears to be doing excellent in regard to his wounds. In fact I am not certain that these are not even completely healed on initial inspection. Overall I am very pleased with where things stand today. Good news is that they are healed he is actually get ready to go out of town and that will be helpful as well as he will be a full part of the time. Readmission: 02/04/2021 upon evaluation today patient appears to be doing well at this point in regard to his left heel that I previously saw him for. Unfortunately he is having issues with his right lower extremity. He has significant wounds at this point he also has erythema noted there is definite signs of cellulitis which is unfortunate. With that being said I think we do need to address this sooner rather than later. The good news is he did have a nice trip to the beach. He tells me that he had no issues during that time. 4/27; patient on Bactrim. Culture showed methicillin sensitive staph aureus therefore the Bactrim should be effective. 2 small areas on the right leg are healed the area on the mid aspect of the tibia almost 100% covered in a very adherent necrotic debris. We have been using silver alginate 02/20/2021 upon evaluation today patient appears to be doing well with regard to his wound. He is showing signs of improvement which is great news overall very pleased with where things stand today. No fevers, chills, nausea, vomiting, or diarrhea. 02/27/2021 upon evaluation today patient appears to be doing well with regard to his leg ulcer. He is tolerating the dressing changes and overall appears to be doing quite excellent. I am extremely pleased with where things stand and overall I think patient is making great progress. There is  no sign of active infection at this time which is also great news. 03/06/2021 upon evaluation today patient appears to be doing excellent in regard to his wounds. In fact he appears to be completely healed today based on what I am seeing. This is excellent news and overall I am extremely pleased with where he stands. Overall the patient is happy to hear this as well this has been quite sometime coming. Readmission: 04/01/2021 patient unfortunately returns for readmission today. He tells me that he has been wearing his compression socks on the right leg daily and he does not really know what is going on and why his legs are doing what they are doing. We did therefore go ahead and probe deeper into exactly what has been going on with him today. Subsequently the patient tells me that when he gets  up and what we would call "first thing in the morning" are really 2 different things". He does not tend to sleep well so he tells me that he will often wake up at 3:00 in the morning. He will then potentially going into the living room to get in his chair where he may read a book for a little while and then potentially fall asleep back in his chair. He then subsequently wake up around 5:00 or so and then get up and in his words "putter around". This often will end with him proceeding at some point around 8 AM or 9 AM to putting on his compression socks. With that being said this means that anywhere from the 3:00 in the morning till roughly around 9:00 in the morning he has no compression on yet he is sitting in his recliner, walking around and up and about, and this is at least 5 to 6 hours of noncompressed time. That may be our issue here but I did not realize until we discussed this further today. Fortunately there does not appear to be any signs of active infection at this time which is great news. With that being said he has multiple wounds of the bilateral lower extremities. 04/10/2021 upon evaluation today  patient appears to be doing well with regard to his legs for the most part. He does look like he had some injury where his wrap slid down nonetheless he did some "doctoring on them". I do feel like most of the areas honestly have cleared back up which is great news. There does not appear to be any signs of active infection at this time which is also great news. No fevers, chills, nausea, vomiting, or diarrhea. 04/18/2021 upon evaluation today patient appears to be doing well with regard to his wounds in fact he appears to be completely healed which is great news. Fortunately there does not appear to be any signs of active infection which is great news. No fevers, chills, nausea, vomiting, or diarrhea. READMISSION 07/28/2021 This is a now 77 year old man we have had in this clinic for quite a bit of this year. 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 He wears compression stockings. 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 He comes in an old running shoes although he says he wears better shoes at home. His history is that he noticed a blister in the right posterior heel. This open. He has been applying Neosporin to it currently the wound measures 2 x 1.4 cm. 100% slough covered. Not really offloading this in any rigorous way. MALAKIE, BALIS ([id number]) [id number] Page 8 of 11 Past medical history is essentially unchanged he has paroxysmal atrial fibrillation on Coumadin type 2 diabetes with a recent hemoglobin A1c of 7 on metformin. ABI in our clinic was 0.98 on the right 08/05/2021 upon evaluation today patient appears to be doing well with regard to his heel ulcer. Fortunately there does not appear to be any signs of active infection at this time. Overall I been very pleased with where things seem to be currently as far as the wound healing is concerned.  Again this is first a lot of seeing him Dr. Leanord Hawking readmitted him last week. Nonetheless I think we are definitely making some progress here. 08/11/2021 upon evaluation today patient's wound on the heel actually showing signs of excellent improvement. I am actually very pleased with where things  stand currently. No fevers, chills, nausea, vomiting, or diarrhea. There does not appear to be any need for sharp debridement today either which is also great news. 08/25/2021 upon evaluation today patient appears to be doing well with regard to his heel ulcer. This is actually looking significantly improved compared to last time I saw him. Fortunately there does not appear to be any evidence of active infection at this time. No fevers, chills, nausea, vomiting, or diarrhea. 09/01/2021 upon evaluation today patient appears to be doing decently well in regard to his wounds. Fortunately there does not appear to be any signs of active infection at this time which is great news and overall very pleased in that regard. I do not see any evidence of active infection systemically which is great news as well. No fevers, chills, nausea, vomiting, or diarrhea. I think that the heel is making excellent progress. 09/15/2021 upon evaluation today patient's heel unfortunately is significantly worse compared to what it was previous. I do believe that at this time he would benefit from switching back to something a little bit more offloading from the cushion shoe he has right now this is more of a heel protector what I really need is a Prevalon offloading boot which I think is good to do a lot better for him than just the small heel protector. 09/23/2021 upon evaluation today patient appears to be doing a little better in regards to last week's visit. Overall I think that he is making good progress which is great news and there does not appear to be any evidence of active infection at this time. No fevers, chills, nausea,  vomiting, or diarrhea. 09/30/2021 upon evaluation patient's heel is actually showing signs of good improvement which is great news. Fortunately there does not appear to be any evidence of active infection locally nor systemically at this point. No fevers, chills, nausea, vomiting, or diarrhea. 10/07/2021 upon evaluation today patient appears to be doing well currently in regard to his heel ulcer. He has been tolerating the dressing changes without complication. Fortunately I do not see any signs of active infection at this time which is great news. No fevers, chills, nausea, vomiting, or diarrhea. 12/27; wound is measuring smaller. We have been using silver alginate 10/28/2021 upon evaluation today patient appears to be doing well currently in regard to his heel ulcer. I am actually very pleased with where things stand and I think he is making excellent progress. Fortunately I do not see any signs of active infection locally nor systemically at this point which is great news. Nonetheless I do believe that the patient would benefit from a new offloading shoe and has been using it Velcro is no longer functioning properly and he tells me that he almost tripped and fell because of that today. Obviously I do not want him following that would be very bad. 11/11/2021 upon evaluation today patient appears to be doing excellent in regard to his wound. In fact this appears to be completely healed based on what I see currently. I do not see any signs of anything open or draining and I did double check just by clearing away some of the callus around the edges of the wound to ensure that there was nothing still open or hiding underneath. Once this was cleared away it appeared that the patient was doing significantly better at this time which is great news and there was nothing actually open. Readmission: 06-19-2022 upon evaluation today patient appears to be doing well currently  in regard to his heel ulcer all things  considered. He unfortunately is having a bit of breakdown in general as far as the heel is concerned not nearly as bad as what we noted last time he was here in the clinic. The good news is I do think that this should hopefully heal much more rapidly than what we noted last time. His past medical history has not changed he is on Coumadin. 07-03-2022 upon evaluation today patient appears to be doing better in regard to his heel. We will start to see some improvement here which is good news. Fortunately I do not see any evidence of infection locally or systemically at this time which is great news. No fevers, chills, nausea, vomiting, or diarrhea. 07-10-2022 upon evaluation today patient appears to be doing well currently in regard to his wound from the callus standpoint around the edges. Unfortunately from an actual wound bed standpoint he has some deep tissue injury noted at this point. Again I am not sure exactly what is going on that is causing this pressure to the region but he is definitely gotten pressure that is occurring and causing this to not heal as effectively as what we would like to see. I discussed with him today that he may need to at night when he sleeping use a pillow up underneath his calf in order to prevent the heel from touching the bed at all. I also think that it may be beneficial for him to monitor throughout his day and make sure there is no other time when he is getting the pressure to the heel. 07-23-2022 upon evaluation today patient appears to be doing poorly currently in regard to his heel ulcer. He has been tolerating the dressing changes without complication. Unfortunately I feel like he may be infected based on what I am seeing. 07-28-2022 I did review patient's culture results which showed positive for Proteus as well as Staphylococcus both of which would be treated with the Bactrim DS that I gave him at the last visit. This is good news and hopefully means that we should  be able to apply the cast today as long as the wound itself does not appear to be doing poorly. Patient's wound bed actually showed signs of doing quite well and I am very pleased with where things stand and I do not see any signs of worsening overall which is great news. 08-04-2022 upon evaluation today patient appears to be doing well currently in regard to his heel ulcer. I am actually very pleased with where things stand and I do think this is looking significantly better. With regard to his right shin that is also showing signs of improvement which is great news. 08-10-2022 upon evaluation today patient's wound on the heel actually appears to be doing significantly better. In regard to the right anterior shin this is showing signs of healing it might even be completely healed but I still get a monitor I cannot get anything to fill away but also could not see where there was anything actually draining at this point. I am tending to think healed but I want a monitor 1 more week before I call it for sure. 08-17-2022 upon evaluation today patient appears to be doing well currently in regard to his heel. Fortunately he is tolerating the dressing changes without complication. I do not see any signs of infection and overall I think he is making good progress here. We did get approval for the Apligraf but I  think he had a $295 co-pay that would have to be paid per application. With that being said as good as he is doing right now I do not think that is necessary but is still an option depending on how things progress if we need to speed this up quite a bit. 08/24/2022; this patient has a wound on his left heel in the setting of type 2 diabetes. We have been using Prisma. He has a heel offloading boot 08-31-2022 upon evaluation today patient appears to be doing poorly in regard to his heel ulcer which is still showing signs of bruising I am just not as happy as I was when I saw him 2 weeks ago with this. He  saw Dr. Leanord Hawking last week. Also did not look good at that point apparently. Nonetheless I think that the patient is still continuing to have some counterpressure getting to the wound bed unfortunately. 09-07-2022 upon evaluation today patient appears to be doing well currently in regard to his heel ulcer. He has been tolerating the dressing changes without complication. Fortunately there does not appear to be any signs of active infection locally or systemically at this time. Fortunately I do not see any evidence of active infection at this time. 09-15-2022 upon evaluation today patient appears to be doing well currently in regard to his legs which in fact appear to be completely healed this is great news. With that being said his heel does have some erythema and warmth as well as drainage I am concerned about cellulitis at this location. TOD, ABRAHAMSEN ([id number]) [id number] Page 9 of 11 09-21-2022 upon evaluation today patient's wound is actually showing signs of being a little bit smaller but still we are not doing nearly as well as what I would like to see. Fortunately I do not see any signs of infection at this time which is great news and overall I am extremely pleased in that regard. With that being said I do think that he needs to continue to elevate his leg although the edema is under great control with a compression wrap I think switching to a different dressing topically may be beneficial. My suggestion is to switch him over to a Iodoflex dressing. 09-28-2022 upon evaluation today patient appears to be doing well with regard to his heel ulcer. This is actually showing signs of improvement which is great news and overall I am extremely pleased with where we stand today. 10-06-2022 upon evaluation today patient actually appears to be making excellent progress at this point. Fortunately there does not appear to be any signs of active infection locally nor  systemically which is great news and overall I am extremely pleased with where we stand. I do feel like he is actually making some pretty good progress here as we switch to the wrap along with the Iodoflex. 12/26; patient has a wound on the tip of his left heel. This is not a weightbearing surface. He is using a heel offloading boot and carefully offloading this at other times during the day. He is using Iodoflex and Zetuvitunder 3 layer compression 10-22-2022 upon evaluation today patient appears to be doing well currently in regard to his wound. He has been tolerating the dressing changes without complication. Fortunately there does not appear to be any signs of active infection locally nor systemically at this time. No fevers, chills, nausea, vomiting, or diarrhea. 08-30-2023 upon evaluation today patient appears to be doing well currently in regard to his heel ulcer which is  showing signs of good improvement. Fortunately I do not see any evidence of infection locally nor systemically which is great news and overall I am extremely pleased with where we stand. No fevers, chills, nausea, vomiting, or diarrhea. Unfortunately he does have a wound on the side of his fifth toe where I think this did rub on the shoe. 11-05-2022 upon evaluation today patient still still showing some signs of pressure at this point and there may have even been some fluid collection at this time. Fortunately I do not see any evidence of active infection locally nor systemically which is great news and overall I am extremely pleased with where we stand. No fevers, chills, nausea, vomiting, or diarrhea. 1/25; the patient's area on the dorsal aspect of the left fifth toe is healed. We have been using Iodoflex to the area on the left heel. The lateral wound is slightly down in length. 11-19-2022 upon evaluation today patient appears to be doing well currently in regard to his wound. He has been tolerating the dressing changes  without complication. Fortunately there does not appear to be any signs of infection we been using Iodoflex up to this point. 11-26-2022 upon evaluation today patient appears to be doing poorly currently in regard to his wound. He has been tolerating the dressing changes with the Iodoflex without complication. With that being said he does not have any signs of active infection systemically though locally I do feel like there is some evidence of infection here. 12-03-2022 upon evaluation today patient appears to be doing well currently in regard to his wound this is better with the Bactrim compared to last week still there was some significant slough and biofilm buildup which is going require sharp debridement at this point. Fortunately I do not see any signs of active infection locally nor systemically which is great news. 12-11-2022 upon evaluation today patient actually appears to be doing significantly better at this time in regard to his heel. I am actually very pleased with where things stand currently. There is no signs of active infection locally or systemically at this point. 12-17-2022 upon evaluation today patient appears to be doing better as far as the overall appearance of his wound. I am actually very pleased in that regard. Fortunately there does not appear to be any signs of active infection locally nor systemically at this time. I do feel like that the patient is showing signs of excellent improvement in general. Nonetheless he needs something better for compression I think he should go ahead and see about getting compression socks. 3/7; tip of the left heel measurements are smaller he has been using Hydrofera Blue 01-07-2023 upon evaluation today patient appears to be doing well currently in regard to his wound. This has not been debrided since I last saw him and it definitely shows that definitely needs some debridement today. Fortunately I do not see any evidence of active infection  locally nor systemically which is great news. 01-14-2023 upon evaluation today patient appears to be doing well currently although he does have some erythema and warmth to the heel I feel like he may have developed an infection yet again. This is something that is been ongoing issue we have been trying to manage his foot best we could. With that being said I do think he may need to go back on the Bactrim this has done well for him in the past. 01-21-2023 upon evaluation today patient appears to be doing better in regard to his heel.  He is doing very well with the antibiotics and that is made a big difference for him based on what I am seeing I am very pleased with where we stand today. He is obviously doing extremely well which is great news. 01-28-2023 upon evaluation today patient appears to be doing well currently in regard to his wound. This is actually showing signs of excellent improvement I am actually very pleased with where we stand and I do believe that he is making good progress here. Fortunately I do not see any evidence of active infection locally nor systemically which is great news. No fevers, chills, nausea, vomiting, or diarrhea. 02-04-2023 upon evaluation today patient's wound is actually showed signs of excellent improvement I am actually very pleased with where we stand. I do not see any signs of active infection locally nor systemically which is great news and in general I do believe that we are moving in the right direction here. No fevers, chills, nausea, vomiting, or diarrhea. Objective Constitutional Well-nourished and well-hydrated in no acute distress. Vitals Time Taken: 1:53 PM, Height: 70 in, Weight: 265 lbs, BMI: 38, Temperature: 97.8 F, Pulse: 69 bpm, Respiratory Rate: 16 breaths/min, Blood Pressure: 122/65 mmHg. Respiratory normal breathing without difficulty. Psychiatric DIETER, HANE ([id number]) [id number] Page 10 of 11 this  patient is able to make decisions and demonstrates good insight into disease process. Alert and Oriented x 3. pleasant and cooperative. General Notes: Upon inspection patient's wound bed showed signs of good granulation epithelization at this point. Fortunately I do not see any evidence of active infection locally nor systemically which is great news and I did have to perform debridement but fortunately this was not too significant today it actually was the least of how to do in a long time. Integumentary (Hair, Skin) Wound #10 status is Open. Original cause of wound was Gradually Appeared. The date acquired was: 06/05/2022. The wound has been in treatment 32 weeks. The wound is located on the Left Calcaneus. The wound measures 1.3cm length x 2.7cm width x 0.1cm depth; 2.757cm^2 area and 0.276cm^3 volume. There is Fat Layer (Subcutaneous Tissue) exposed. There is a medium amount of serosanguineous drainage noted. The wound margin is flat and intact. There is small (1-33%) pink granulation within the wound bed. There is a large (67-100%) amount of necrotic tissue within the wound bed including Adherent Slough. 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 There was a Excisional Skin/Subcutaneous Tissue Debridement with a total area of 3.51 sq cm performed by [name] Derry, PA-C. With the following instrument(s): Curette to remove Viable and Non-Viable tissue/material. Material removed includes Subcutaneous Tissue, Slough, and Biofilm. No specimens were taken. A time out was conducted at 14:16, prior to the start of the procedure. A Minimum amount of  bleeding was controlled with Pressure. The procedure was tolerated well with a pain level of 0 throughout and a pain level of 0 following the procedure. Post Debridement Measurements: 1.3cm length x 2.7cm width x 0.2cm depth; 0.551cm^3 volume. Character of Wound/Ulcer Post Debridement is stable. Severity of Tissue Post Debridement is: Fat layer exposed. Post procedure Diagnosis Wound #10: Same as Pre-Procedure Plan Follow-up Appointments: Wound #10 Left Calcaneus: Return  Appointment in 1 week. Bathing/ Shower/ Hygiene: May shower; gently cleanse wound with antibacterial soap, rinse and pat dry prior to dressing wounds Non-Wound Condition: Additional non-wound orders/instructions: - Apply 40% Urea cream to dry cracked skin on feet. Off-Loading: Open toe surgical shoe with peg assist. Medications-Please add to medication list.: P.O. Antibiotics - Start Bactrim WOUND #10: - Calcaneus Wound Laterality: Left Topical: Gentamicin 3 x Per Week/30 Days Discharge Instructions: Apply as directed by provider. Prim Dressing: Hydrofera Blue Ready Transfer Foam, 2.5x2.5 (in/in) (Dispense As Written) 3 x Per Week/30 Days ary Discharge Instructions: Apply Hydrofera Blue Ready to wound bed as directed Secondary Dressing: ABD Pad 5x9 (in/in) (Generic) 3 x Per Week/30 Days Discharge Instructions: Cover with ABD pad Secured With: Kerlix Roll Sterile or Non-Sterile 6-ply 4.5x4 (yd/yd) 3 x Per Week/30 Days Discharge Instructions: Apply Kerlix as directed Secured With: Tubigrip Size D, 3x10 (in/yd) 3 x Per Week/30 Days 1. I am going to recommend currently that the patient continue to monitor for any signs of infection or worsening. Obviously if anything changes he knows contact the office let me know. 2. I am also going to suggest that he continue to elevate his legs much as possible keep pressure off the heel both of which are good to be beneficial for him. We will see patient back for reevaluation in 1 week  here in the clinic. If anything worsens or changes patient will contact our office for additional recommendations. Electronic Signature(s) Signed: 02/05/2023 1:22:39 PM By: [name] Derry PA-C Entered By: [name] Derry on 02/05/2023 13:22:38 Barrie Folk ([id number]) [id number] Page 11 of 11 -------------------------------------------------------------------------------- SuperBill Details Patient Name: Date of Service: [name], [name] 02/04/2023 Medical Record Number: [id number] Patient Account Number: [id number] Date of Birth/Sex: Treating RN: Jan 26, 1946 (77 y.o. [name] Primary Care Provider: [name] Other Clinician: Referring Provider: Treating Provider/Extender: Hermine Messick Weeks in Treatment: 32 Diagnosis Coding ICD-10 Codes Code Description E11.621 Type 2 diabetes mellitus with foot ulcer 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sed E11.42 Type 2 diabetes mellitus with diabetic polyneuropathy Facility Procedures : CPT4 Code: [id number] Description: 11042 - DEB SUBQ TISSUE 20 SQ CM/< ICD-10 Diagnosis Description 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sed Modifier: Quantity: 1 Physician Procedures : CPT4 Code Description Modifier [id number] 11042 - WC PHYS SUBQ TISS 20 SQ CM ICD-10 Diagnosis Description 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sed Quantity: 1 Electronic Signature(s) Signed: 02/05/2023 1:25:15 PM By: [name] Derry PA-C Entered By: [name] Derry on 02/05/2023 13:25:14

## 2023-02-11 ENCOUNTER — Encounter: Payer: Medicare HMO | Admitting: Physician Assistant

## 2023-02-11 DIAGNOSIS — E11621 Type 2 diabetes mellitus with foot ulcer: Secondary | ICD-10-CM | POA: Diagnosis not present

## 2023-02-12 NOTE — Progress Notes (Signed)
[name] ([id number]) [id number] Page 1 of 10 Visit Report for 02/11/2023 Chief Complaint Document Details Patient Name: Date of Service: [name], [name] [facility] : [name] 02/11/2023 1:45 PM Medical Record Number: [id number] Patient Account Number: [id number] Date of Birth/Sex: Treating RN: 1946-01-29 (77 y.o. [name] Primary Care Provider: Aram [name] Other Clinician: Betha [name] Referring Provider: Treating Provider/Extender: [name] Weeks in Treatment: 33 Information Obtained from: Patient Chief Complaint Left heel ulcer Electronic Signature(s) Signed: 02/11/2023 2:10:42 PM By: [name] Derry PA-C Entered By: [name] on 02/11/2023 14:10:41 -------------------------------------------------------------------------------- HPI Details Patient Name: Date of Service: [name], [name] [name] J. 02/11/2023 1:45 PM Medical Record Number: [id number] Patient Account Number: [id number] Date of Birth/Sex: Treating RN: 05/21/1946 (77 y.o. [name] Primary Care Provider: Aram [name] Other Clinician: Betha [name] Referring Provider: Treating Provider/Extender: [name] in Treatment: 33 History of Present Illness HPI Description: 09/24/2020 on evaluation today patient presents today for a heel ulcer that he tells me has been present for about 2 years. He has been seeing podiatry and they have been attempting to manage this including what sounds to be a total contact cast, Unna boot, and just standard dressings otherwise as well. Most recently has been using triple antibiotic ointment. With that being said unfortunately despite everything he really has not had any significant improvement. He tells me that he cannot even really remember exactly how this began but he presumed it may have rubbed on his shoes or something of that nature. 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 He tells me that this does seem to help. Fortunately there is no signs of active infection at this time. His most recent hemoglobin A1c was 8.1 though he knows this was this year he cannot tell me the exact time. His fluid pills currently to help with some of the lower extremity edema although he does obviously have signs of venous stasis/lymphedema. Currently there is no evidence of active infection. No fevers, chills, nausea, vomiting, or diarrhea. Patient has had fairly recent ABIs which were performed on 07/19/2020 and revealed that he has normal findings in both the ankle and toe locations bilaterally. His ABI on the right was 1.09 on the left was 1.08 with a TBI on the right of 0.88 and on the left of 0.94. Triphasic flow was noted throughout. 10/08/2020 on evaluation today patient appears to be doing pretty well in regard to his left heel currently in fact this is doing a great job and seems to be healing quite nicely. Unfortunately on his right leg he had a pile of wood that actually fell on him injuring his right leg this is somewhat erythematous has me concerned little bit about cellulitis though there is not really a good area to culture at this point. 10/24/2020 upon evaluation today patient appears to be doing well with regard to his heel ulcer. He is showing signs of improvement which is great news. His right leg is completely healed. Overall I feel like he is doing excellent and there is no signs of infection. 11/07/2020 upon evaluation today patient appears to be doing well with regard to his heel ulcer. He tells me that last week  when he was unable to come in his wife actually thought that the wound was very close to closing if not closed. Then it  began to "reopen again". I really feel like what may have happened as the collagen may have dried over the wound bed and that because that misunderstanding with thinking that the wound was healing. With that being said I did not see it last week I do not know that for certain. Either way I feel like he is doing great today I see no signs of infection at this point. 11/26/2020 upon inspection today patient appears to be doing decently well in regard to his heel ulcer. He has been tolerating the dressing changes without complication. Fortunately there is no sign of active infection at this time. No fevers, chills, nausea, vomiting, or diarrhea. 12/10/2020 upon evaluation today patient appears to be doing fairly well in regard to the wound on his heel as well as what appears to be a new wound of the left first metatarsal head plantar aspect. This seems to be an area that was callus that has split as the patient tells me has been trying to walk on his toes more has probably where this came from. With that being said there does not appear to be signs of active infection which is great news. 12/17/2020 upon evaluation today patient actually appears to be making good progress currently. Fortunately there is no evidence of active infection at this time. Overall I feel like he is very close to complete closure. 12/26/2020 upon evaluation today patient appears to be doing excellent in regard to his wounds. In fact I am not certain that these are not even completely healed on initial inspection. Overall I am very pleased with where things stand today. Good news is that they are healed he is actually get ready to go out of town and that will be helpful as well as he will be a full part of the time. Readmission: 02/04/2021 upon evaluation today patient appears to be doing well at this point in regard to his left heel that I previously saw him for. Unfortunately he is having [name], [name] ([id number])  [id number] Page 2 of 10 issues with his right lower extremity. He has significant wounds at this point he also has erythema noted there is definite signs of cellulitis which is unfortunate. With that being said I think we do need to address this sooner rather than later. The good news is he did have a nice trip to the beach. He tells me that he had no issues during that time. 4/27; patient on Bactrim. Culture showed methicillin sensitive staph aureus therefore the Bactrim should be effective. 2 small areas on the right leg are healed the area on the mid aspect of the tibia almost 100% covered in a very adherent necrotic debris. We have been using silver alginate 02/20/2021 upon evaluation today patient appears to be doing well with regard to his wound. He is showing signs of improvement which is great news overall very pleased with where things stand today. No fevers, chills, nausea, vomiting, or diarrhea. 02/27/2021 upon evaluation today patient appears to be doing well with regard to his leg ulcer. He is tolerating the dressing changes and overall appears to be doing quite excellent. I am extremely pleased with where things stand and overall I think patient is making great progress. There is no sign of active infection at this time which is also great news. 03/06/2021 upon evaluation today patient  appears to be doing excellent in regard to his wounds. In fact he appears to be completely healed today based on what I am seeing. This is excellent news and overall I am extremely pleased with where he stands. Overall the patient is happy to hear this as well this has been quite sometime coming. Readmission: 04/01/2021 patient unfortunately returns for readmission today. He tells me that he has been wearing his compression socks on the right leg daily and he does not really know what is going on and why his legs are doing what they are doing. We did therefore go ahead and probe  deeper into exactly what has been going on with him today. Subsequently the patient tells me that when he gets up and what we would call "first thing in the morning" are really 2 different things". He does not tend to sleep well so he tells me that he will often wake up at 3:00 in the morning. He will then potentially going into the living room to get in his chair where he may read a book for a little while and then potentially fall asleep back in his chair. He then subsequently wake up around 5:00 or so and then get up and in his words "putter around". This often will end with him proceeding at some point around 8 AM or 9 AM to putting on his compression socks. With that being said this means that anywhere from the 3:00 in the morning till roughly around 9:00 in the morning he has no compression on yet he is sitting in his recliner, walking around and up and about, and this is at least 5 to 6 hours of noncompressed time. That may be our issue here but I did not realize until we discussed this further today. Fortunately there does not appear to be any signs of active infection at this time which is great news. With that being said he has multiple wounds of the bilateral lower extremities. 04/10/2021 upon evaluation today patient appears to be doing well with regard to his legs for the most part. He does look like he had some injury where his wrap slid down nonetheless he did some "doctoring on them". I do feel like most of the areas honestly have cleared back up which is great news. There does not appear to be any signs of active infection at this time which is also great news. No fevers, chills, nausea, vomiting, or diarrhea. 04/18/2021 upon evaluation today patient appears to be doing well with regard to his wounds in fact he appears to be completely healed which is great news. Fortunately there does not appear to be any signs of active infection which is great news. No fevers, chills, nausea, vomiting,  or diarrhea. READMISSION 07/28/2021 This is a now 77 year old man we have had in this clinic for quite a bit of this year. 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 He wears compression stockings. 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 He comes in an old running shoes although he says he wears better shoes at home. His history is that he noticed a blister in the right posterior heel. This open. He has been applying Neosporin to it currently the wound measures 2 x 1.4 cm. 100% slough covered. Not really offloading this in any rigorous way. Past medical history is essentially unchanged he has paroxysmal atrial fibrillation on Coumadin type 2 diabetes  with a recent hemoglobin A1c of 7 on metformin. ABI in our clinic was 0.98 on the right 08/05/2021 upon evaluation today patient appears to be doing well with regard to his heel ulcer. Fortunately there does not appear to be any signs of active infection at this time. Overall I been very pleased with where things seem to be currently as far as the wound healing is concerned. Again this is first a lot of seeing him Dr. Leanord Hawking readmitted him last week. Nonetheless I think we are definitely making some progress here. 08/11/2021 upon evaluation today patient's wound on the heel actually showing signs of excellent improvement. I am actually very pleased with where things stand currently. No fevers, chills, nausea, vomiting, or diarrhea. There does not appear to be any need for sharp debridement today either which is also great news. 08/25/2021 upon evaluation today patient appears to be doing well with regard to his heel ulcer. This is actually looking significantly improved compared to last time I saw him. Fortunately there does not appear to be any evidence of active infection at this time. No fevers, chills, nausea, vomiting, or  diarrhea. 09/01/2021 upon evaluation today patient appears to be doing decently well in regard to his wounds. Fortunately there does not appear to be any signs of active infection at this time which is great news and overall very pleased in that regard. I do not see any evidence of active infection systemically which is great news as well. No fevers, chills, nausea, vomiting, or diarrhea. I think that the heel is making excellent progress. 09/15/2021 upon evaluation today patient's heel unfortunately is significantly worse compared to what it was previous. I do believe that at this time he would benefit from switching back to something a little bit more offloading from the cushion shoe he has right now this is more of a heel protector what I really need is a Prevalon offloading boot which I think is good to do a lot better for him than just the small heel protector. 09/23/2021 upon evaluation today patient appears to be doing a little better in regards to last week's visit. Overall I think that he is making good progress which is great news and there does not appear to be any evidence of active infection at this time. No fevers, chills, nausea, vomiting, or diarrhea. 09/30/2021 upon evaluation patient's heel is actually showing signs of good improvement which is great news. Fortunately there does not appear to be any evidence of active infection locally nor systemically at this point. No fevers, chills, nausea, vomiting, or diarrhea. 10/07/2021 upon evaluation today patient appears to be doing well currently in regard to his heel ulcer. He has been tolerating the dressing changes without complication. Fortunately I do not see any signs of active infection at this time which is great news. No fevers, chills, nausea, vomiting, or diarrhea. 12/27; wound is measuring smaller. We have been using silver alginate 10/28/2021 upon evaluation today patient appears to be doing well currently in regard to his heel  ulcer. I am actually very pleased with where things stand and I think he is making excellent progress. Fortunately I do not see any signs of active infection locally nor systemically at this point which is great news. Nonetheless I do believe that the patient would benefit from a new offloading shoe and has been using it Velcro is no longer functioning properly and he tells me that he almost tripped and fell because of that today. Obviously I do  not want him following that would be very bad. 11/11/2021 upon evaluation today patient appears to be doing excellent in regard to his wound. In fact this appears to be completely healed based on what I see currently. I do not see any signs of anything open or draining and I did double check just by clearing away some of the callus around the edges of the wound to ensure that there was nothing still open or hiding underneath. Once this was cleared away it appeared that the patient was doing significantly better at this time which is great news and there was nothing actually open. [name], [name] ([id number]) [id number] Page 3 of 10 Readmission: 06-19-2022 upon evaluation today patient appears to be doing well currently in regard to his heel ulcer all things considered. He unfortunately is having a bit of breakdown in general as far as the heel is concerned not nearly as bad as what we noted last time he was here in the clinic. The good news is I do think that this should hopefully heal much more rapidly than what we noted last time. His past medical history has not changed he is on Coumadin. 07-03-2022 upon evaluation today patient appears to be doing better in regard to his heel. We will start to see some improvement here which is good news. Fortunately I do not see any evidence of infection locally or systemically at this time which is great news. No fevers, chills, nausea, vomiting, or diarrhea. 07-10-2022 upon evaluation today  patient appears to be doing well currently in regard to his wound from the callus standpoint around the edges. Unfortunately from an actual wound bed standpoint he has some deep tissue injury noted at this point. Again I am not sure exactly what is going on that is causing this pressure to the region but he is definitely gotten pressure that is occurring and causing this to not heal as effectively as what we would like to see. I discussed with him today that he may need to at night when he sleeping use a pillow up underneath his calf in order to prevent the heel from touching the bed at all. I also think that it may be beneficial for him to monitor throughout his day and make sure there is no other time when he is getting the pressure to the heel. 07-23-2022 upon evaluation today patient appears to be doing poorly currently in regard to his heel ulcer. He has been tolerating the dressing changes without complication. Unfortunately I feel like he may be infected based on what I am seeing. 07-28-2022 I did review patient's culture results which showed positive for Proteus as well as Staphylococcus both of which would be treated with the Bactrim DS that I gave him at the last visit. This is good news and hopefully means that we should be able to apply the cast today as long as the wound itself does not appear to be doing poorly. Patient's wound bed actually showed signs of doing quite well and I am very pleased with where things stand and I do not see any signs of worsening overall which is great news. 08-04-2022 upon evaluation today patient appears to be doing well currently in regard to his heel ulcer. I am actually very pleased with where things stand and I do think this is looking significantly better. With regard to his right shin that is also showing signs of improvement which is great news. 08-10-2022 upon evaluation today patient's wound on the  heel actually appears to be doing significantly  better. In regard to the right anterior shin this is showing signs of healing it might even be completely healed but I still get a monitor I cannot get anything to fill away but also could not see where there was anything actually draining at this point. I am tending to think healed but I want a monitor 1 more week before I call it for sure. 08-17-2022 upon evaluation today patient appears to be doing well currently in regard to his heel. Fortunately he is tolerating the dressing changes without complication. I do not see any signs of infection and overall I think he is making good progress here. We did get approval for the Apligraf but I think he had a $295 co-pay that would have to be paid per application. With that being said as good as he is doing right now I do not think that is necessary but is still an option depending on how things progress if we need to speed this up quite a bit. 08/24/2022; this patient has a wound on his left heel in the setting of type 2 diabetes. We have been using Prisma. He has a heel offloading boot 08-31-2022 upon evaluation today patient appears to be doing poorly in regard to his heel ulcer which is still showing signs of bruising I am just not as happy as I was when I saw him 2 weeks ago with this. He saw Dr. Leanord Hawking last week. Also did not look good at that point apparently. Nonetheless I think that the patient is still continuing to have some counterpressure getting to the wound bed unfortunately. 09-07-2022 upon evaluation today patient appears to be doing well currently in regard to his heel ulcer. He has been tolerating the dressing changes without complication. Fortunately there does not appear to be any signs of active infection locally or systemically at this time. Fortunately I do not see any evidence of active infection at this time. 09-15-2022 upon evaluation today patient appears to be doing well currently in regard to his legs which in fact appear to be  completely healed this is great news. With that being said his heel does have some erythema and warmth as well as drainage I am concerned about cellulitis at this location. 09-21-2022 upon evaluation today patient's wound is actually showing signs of being a little bit smaller but still we are not doing nearly as well as what I would like to see. Fortunately I do not see any signs of infection at this time which is great news and overall I am extremely pleased in that regard. With that being said I do think that he needs to continue to elevate his leg although the edema is under great control with a compression wrap I think switching to a different dressing topically may be beneficial. My suggestion is to switch him over to a Iodoflex dressing. 09-28-2022 upon evaluation today patient appears to be doing well with regard to his heel ulcer. This is actually showing signs of improvement which is great news and overall I am extremely pleased with where we stand today. 10-06-2022 upon evaluation today patient actually appears to be making excellent progress at this point. Fortunately there does not appear to be any signs of active infection locally nor systemically which is great news and overall I am extremely pleased with where we stand. I do feel like he is actually making some pretty good progress here as we switch to the wrap  along with the Iodoflex. 12/26; patient has a wound on the tip of his left heel. This is not a weightbearing surface. He is using a heel offloading boot and carefully offloading this at other times during the day. He is using Iodoflex and Zetuvitunder 3 layer compression 10-22-2022 upon evaluation today patient appears to be doing well currently in regard to his wound. He has been tolerating the dressing changes without complication. Fortunately there does not appear to be any signs of active infection locally nor systemically at this time. No fevers, chills, nausea, vomiting, or  diarrhea. 08-30-2023 upon evaluation today patient appears to be doing well currently in regard to his heel ulcer which is showing signs of good improvement. Fortunately I do not see any evidence of infection locally nor systemically which is great news and overall I am extremely pleased with where we stand. No fevers, chills, nausea, vomiting, or diarrhea. Unfortunately he does have a wound on the side of his fifth toe where I think this did rub on the shoe. 11-05-2022 upon evaluation today patient still still showing some signs of pressure at this point and there may have even been some fluid collection at this time. Fortunately I do not see any evidence of active infection locally nor systemically which is great news and overall I am extremely pleased with where we stand. No fevers, chills, nausea, vomiting, or diarrhea. 1/25; the patient's area on the dorsal aspect of the left fifth toe is healed. We have been using Iodoflex to the area on the left heel. The lateral wound is slightly down in length. 11-19-2022 upon evaluation today patient appears to be doing well currently in regard to his wound. He has been tolerating the dressing changes without complication. Fortunately there does not appear to be any signs of infection we been using Iodoflex up to this point. 11-26-2022 upon evaluation today patient appears to be doing poorly currently in regard to his wound. He has been tolerating the dressing changes with the Iodoflex without complication. With that being said he does not have any signs of active infection systemically though locally I do feel like there is some evidence of infection here. 12-03-2022 upon evaluation today patient appears to be doing well currently in regard to his wound this is better with the Bactrim compared to last week still there was some significant slough and biofilm buildup which is going require sharp debridement at this point. Fortunately I do not see any signs of  active infection locally nor systemically which is great news. 12-11-2022 upon evaluation today patient actually appears to be doing significantly better at this time in regard to his heel. I am actually very pleased with where things stand currently. There is no signs of active infection locally or systemically at this point. [name], [name] ([id number]) [id number] Page 4 of 10 12-17-2022 upon evaluation today patient appears to be doing better as far as the overall appearance of his wound. I am actually very pleased in that regard. Fortunately there does not appear to be any signs of active infection locally nor systemically at this time. I do feel like that the patient is showing signs of excellent improvement in general. Nonetheless he needs something better for compression I think he should go ahead and see about getting compression socks. 3/7; tip of the left heel measurements are smaller he has been using Hydrofera Blue 01-07-2023 upon evaluation today patient appears to be doing well currently in regard to his wound. This has not  been debrided since I last saw him and it definitely shows that definitely needs some debridement today. Fortunately I do not see any evidence of active infection locally nor systemically which is great news. 01-14-2023 upon evaluation today patient appears to be doing well currently although he does have some erythema and warmth to the heel I feel like he may have developed an infection yet again. This is something that is been ongoing issue we have been trying to manage his foot best we could. With that being said I do think he may need to go back on the Bactrim this has done well for him in the past. 01-21-2023 upon evaluation today patient appears to be doing better in regard to his heel. He is doing very well with the antibiotics and that is made a big difference for him based on what I am seeing I am very pleased with where we stand  today. He is obviously doing extremely well which is great news. 01-28-2023 upon evaluation today patient appears to be doing well currently in regard to his wound. This is actually showing signs of excellent improvement I am actually very pleased with where we stand and I do believe that he is making good progress here. Fortunately I do not see any evidence of active infection locally nor systemically which is great news. No fevers, chills, nausea, vomiting, or diarrhea. 02-04-2023 upon evaluation today patient's wound is actually showed signs of excellent improvement I am actually very pleased with where we stand. I do not see any signs of active infection locally nor systemically which is great news and in general I do believe that we are moving in the right direction here. No fevers, chills, nausea, vomiting, or diarrhea. 02-11-2023 upon evaluation patient's wound actually showing signs of excellent improvement I am actually very pleased with where we stand I think that his pain is better there is no signs of infection we will continue to use the gentamicin and overall we are making excellent progress. Electronic Signature(s) Signed: 02/11/2023 5:09:33 PM By: [name] Derry PA-C Entered By: [name] on 02/11/2023 17:09:33 -------------------------------------------------------------------------------- Physical Exam Details Patient Name: Date of Service: [name], [name] [name] J. 02/11/2023 1:45 PM Medical Record Number: [id number] Patient Account Number: [id number] Date of Birth/Sex: Treating RN: 08-29-1946 (77 y.o. [name] Primary Care Provider: Aram [name] Other Clinician: Betha [name] Referring Provider: Treating Provider/Extender: [name] Weeks in Treatment: 37 Constitutional Well-nourished and well-hydrated in no acute distress. Respiratory normal breathing without difficulty. Psychiatric this patient is able to make decisions and demonstrates good insight  into disease process. Alert and Oriented x 3. pleasant and cooperative. Notes Upon inspection patient's wound bed actually showed signs of good granulation and epithelization at this point. I do think that he does not need any sharp debridement today which is great news. Overall I think that he is really doing quite well. Electronic Signature(s) Signed: 02/11/2023 5:10:07 PM By: [name] Derry PA-C Entered By: [name] on 02/11/2023 17:10:07 -------------------------------------------------------------------------------- Physician Orders Details Patient Name: Date of Service: [name], [name] [name] J. 02/11/2023 1:45 PM Medical Record Number: [id number] Patient Account Number: [id number] Date of Birth/Sex: Treating RN: [birth year] (77 y.o. 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 Bathing/ Shower/ Hygiene May shower; gently cleanse wound with antibacterial soap, rinse and pat dry prior to dressing wounds Non-Wound Condition dditional non-wound orders/instructions: - Apply 40% Urea cream to dry cracked skin on feet. A Off-Loading Open toe surgical shoe with peg assist. Wound Treatment Wound #10 - Calcaneus Wound Laterality: Left Topical: Gentamicin 3 x Per Week/30 Days Discharge Instructions: Apply as directed by provider. Prim Dressing: Hydrofera Blue Ready Transfer Foam,  2.5x2.5 (in/in) (Dispense As Written) 3 x Per Week/30 Days ary Discharge Instructions: Apply Hydrofera Blue Ready to wound bed as directed Secondary Dressing: ABD Pad 5x9 (in/in) (Generic) 3 x Per Week/30 Days Discharge Instructions: Cover with ABD pad Secured With: Kerlix Roll Sterile or Non-Sterile 6-ply 4.5x4 (yd/yd) 3 x Per Week/30 Days Discharge Instructions: Apply Kerlix as directed Secured With: Tubigrip Size D, 3x10 (in/yd) 3 x Per Week/30 Days Electronic Signature(s) Signed: 02/11/2023 5:47:58 PM By: [name] Derry PA-C Signed: 02/12/2023 1:22:31 PM By: [name] Entered By: [name] on 02/11/2023 14:34:24 -------------------------------------------------------------------------------- Problem List Details Patient Name: Date of Service: [name], [name] [name] J. 02/11/2023 1:45 PM Medical Record Number: [id number] Patient Account Number: [id number] Date of Birth/Sex: Treating RN: 16-May-1946 (77 y.o. [name] Primary Care Provider: Aram [name] Other Clinician: Betha [name] Referring Provider: Treating Provider/Extender: [name] Weeks in Treatment: 33 Active Problems ICD-10 Encounter Code Description Active Date MDM Diagnosis E11.621 Type 2 diabetes mellitus with foot ulcer 06/19/2022 No Yes 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sed 06/19/2022 No Yes E11.42 Type 2 diabetes mellitus with diabetic polyneuropathy 06/19/2022 No Yes Inactive Problems ITZAE, MIRALLES ([id number]) [id number] Page 6 of 10 Resolved Problems Electronic Signature(s) Signed: 02/11/2023 2:10:27 PM By: [name] Derry PA-C Entered By: [name] on 02/11/2023 14:10:27 -------------------------------------------------------------------------------- Progress Note Details Patient Name: Date of Service: [name], [name] [name] J. 02/11/2023 1:45 PM Medical Record Number: [id number] Patient Account Number:  [id number] Date of Birth/Sex: Treating RN: 08/06/1946 (77 y.o. [name] Primary Care Provider: Aram [name] Other Clinician: Betha [name] Referring Provider: Treating Provider/Extender: [name] Weeks in Treatment: 33 Subjective Chief Complaint Information obtained from Patient Left heel ulcer History of Present Illness (HPI) 09/24/2020 on evaluation today patient presents today for a heel ulcer that he tells me has been present for about 2 years. He has been seeing podiatry and they have been attempting to manage this including what sounds to be a total contact cast, Unna boot, and just standard dressings otherwise as well. Most recently has been using triple antibiotic ointment. With that being said unfortunately despite everything he really has not had any significant improvement. He tells me that he cannot even really remember exactly how this began but he presumed it may have rubbed on his shoes or something of that nature. 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 He tells me that this does seem to help. Fortunately there is no signs of active infection at  this time. His most recent hemoglobin A1c was 8.1 though he knows this was this year he cannot tell me the exact time. His fluid pills currently to help with some of the lower extremity edema although he does obviously have signs of venous stasis/lymphedema. Currently there is no evidence of active infection. No fevers, chills, nausea, vomiting, or diarrhea. Patient has had fairly recent ABIs which were performed on 07/19/2020 and revealed that he has normal findings in both the ankle and toe locations bilaterally. His ABI on the  right was 1.09 on the left was 1.08 with a TBI on the right of 0.88 and on the left of 0.94. Triphasic flow was noted throughout. 10/08/2020 on evaluation today patient appears to be doing pretty well in regard to his left heel currently in fact this is doing a great job and seems to be healing quite nicely. Unfortunately on his right leg he had a pile of wood that actually fell on him injuring his right leg this is somewhat erythematous has me concerned little bit about cellulitis though there is not really a good area to culture at this point. 10/24/2020 upon evaluation today patient appears to be doing well with regard to his heel ulcer. He is showing signs of improvement which is great news. His right leg is completely healed. Overall I feel like he is doing excellent and there is no signs of infection. 11/07/2020 upon evaluation today patient appears to be doing well with regard to his heel ulcer. He tells me that last week when he was unable to come in his wife actually thought that the wound was very close to closing if not closed. Then it began to "reopen again". I really feel like what may have happened as the collagen may have dried over the wound bed and that because that misunderstanding with thinking that the wound was healing. With that being said I did not see it last week I do not know that for certain. Either way I feel like he is doing great today I see no signs of infection at this point. 11/26/2020 upon inspection today patient appears to be doing decently well in regard to his heel ulcer. He has been tolerating the dressing changes without complication. Fortunately there is no sign of active infection at this time. No fevers, chills, nausea, vomiting, or diarrhea. 12/10/2020 upon evaluation today patient appears to be doing fairly well in regard to the wound on his heel as well as what appears to be a new wound of the left first metatarsal head plantar aspect. This seems to be an area  that was callus that has split as the patient tells me has been trying to walk on his toes more has probably where this came from. With that being said there does not appear to be signs of active infection which is great news. 12/17/2020 upon evaluation today patient actually appears to be making good progress currently. Fortunately there is no evidence of active infection at this time. Overall I feel like he is very close to complete closure. 12/26/2020 upon evaluation today patient appears to be doing excellent in regard to his wounds. In fact I am not certain that these are not even completely healed on initial inspection. Overall I am very pleased with where things stand today. Good news is that they are healed he is actually get ready to go out of town and that will be helpful as well as he will be a full part of the  time. Readmission: 02/04/2021 upon evaluation today patient appears to be doing well at this point in regard to his left heel that I previously saw him for. Unfortunately he is having issues with his right lower extremity. He has significant wounds at this point he also has erythema noted there is definite signs of cellulitis which is unfortunate. With that being said I think we do need to address this sooner rather than later. The good news is he did have a nice trip to the beach. He tells me that he had no issues during that time. 4/27; patient on Bactrim. Culture showed methicillin sensitive staph aureus therefore the Bactrim should be effective. 2 small areas on the right leg are healed the area on the mid aspect of the tibia almost 100% covered in a very adherent necrotic debris. We have been using silver alginate 02/20/2021 upon evaluation today patient appears to be doing well with regard to his wound. He is showing signs of improvement which is great news overall very pleased with where things stand today. No fevers, chills, nausea, vomiting, or diarrhea. 02/27/2021 upon evaluation  today patient appears to be doing well with regard to his leg ulcer. He is tolerating the dressing changes and overall appears to be doing quite excellent. I am extremely pleased with where things stand and overall I think patient is making great progress. There is no sign of active infection at this time which is also great news. 03/06/2021 upon evaluation today patient appears to be doing excellent in regard to his wounds. In fact he appears to be completely healed today based on what I am seeing. This is excellent news and overall I am extremely pleased with where he stands. Overall the patient is happy to hear this as well this has been quite sometime coming. [name], [name] ([id number]) [id number] Page 7 of 10 Readmission: 04/01/2021 patient unfortunately returns for readmission today. He tells me that he has been wearing his compression socks on the right leg daily and he does not really know what is going on and why his legs are doing what they are doing. We did therefore go ahead and probe deeper into exactly what has been going on with him today. Subsequently the patient tells me that when he gets up and what we would call "first thing in the morning" are really 2 different things". He does not tend to sleep well so he tells me that he will often wake up at 3:00 in the morning. He will then potentially going into the living room to get in his chair where he may read a book for a little while and then potentially fall asleep back in his chair. He then subsequently wake up around 5:00 or so and then get up and in his words "putter around". This often will end with him proceeding at some point around 8 AM or 9 AM to putting on his compression socks. With that being said this means that anywhere from the 3:00 in the morning till roughly around 9:00 in the morning he has no compression on yet he is sitting in his recliner, walking around and up and about, and this is at  least 5 to 6 hours of noncompressed time. That may be our issue here but I did not realize until we discussed this further today. Fortunately there does not appear to be any signs of active infection at this time which is great news. With that being said he has multiple wounds of the  bilateral lower extremities. 04/10/2021 upon evaluation today patient appears to be doing well with regard to his legs for the most part. He does look like he had some injury where his wrap slid down nonetheless he did some "doctoring on them". I do feel like most of the areas honestly have cleared back up which is great news. There does not appear to be any signs of active infection at this time which is also great news. No fevers, chills, nausea, vomiting, or diarrhea. 04/18/2021 upon evaluation today patient appears to be doing well with regard to his wounds in fact he appears to be completely healed which is great news. Fortunately there does not appear to be any signs of active infection which is great news. No fevers, chills, nausea, vomiting, or diarrhea. READMISSION 07/28/2021 This is a now 77 year old man we have had in this clinic for quite a bit of this year. 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 He wears compression stockings. 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 He comes in an old running shoes although he says he wears better shoes at home. His history is that he noticed a blister in the right posterior heel. This open. He has been applying Neosporin to it currently the wound measures 2 x 1.4 cm. 100% slough covered. Not really offloading this in any rigorous way. Past medical history is essentially unchanged he has paroxysmal atrial fibrillation on Coumadin type 2 diabetes with a recent hemoglobin A1c of 7 on metformin. ABI in our clinic was 0.98 on the right 08/05/2021 upon evaluation  today patient appears to be doing well with regard to his heel ulcer. Fortunately there does not appear to be any signs of active infection at this time. Overall I been very pleased with where things seem to be currently as far as the wound healing is concerned. Again this is first a lot of seeing him Dr. Leanord Hawking readmitted him last week. Nonetheless I think we are definitely making some progress here. 08/11/2021 upon evaluation today patient's wound on the heel actually showing signs of excellent improvement. I am actually very pleased with where things stand currently. No fevers, chills, nausea, vomiting, or diarrhea. There does not appear to be any need for sharp debridement today either which is also great news. 08/25/2021 upon evaluation today patient appears to be doing well with regard to his heel ulcer. This is actually looking significantly improved compared to last time I saw him. Fortunately there does not appear to be any evidence of active infection at this time. No fevers, chills, nausea, vomiting, or diarrhea. 09/01/2021 upon evaluation today patient appears to be doing decently well in regard to his wounds. Fortunately there does not appear to be any signs of active infection at this time which is great news and overall very pleased in that regard. I do not see any evidence of active infection systemically which is great news as well. No fevers, chills, nausea, vomiting, or diarrhea. I think that the heel is making excellent progress. 09/15/2021 upon evaluation today patient's heel unfortunately is significantly worse compared to what it was previous. I do believe that at this time he would benefit from switching back to something a little bit more offloading from the cushion shoe he has right now this is more of a heel protector what I really need is a Prevalon offloading boot which I think is good to do  a lot better for him than just the small heel protector. 09/23/2021 upon evaluation  today patient appears to be doing a little better in regards to last week's visit. Overall I think that he is making good progress which is great news and there does not appear to be any evidence of active infection at this time. No fevers, chills, nausea, vomiting, or diarrhea. 09/30/2021 upon evaluation patient's heel is actually showing signs of good improvement which is great news. Fortunately there does not appear to be any evidence of active infection locally nor systemically at this point. No fevers, chills, nausea, vomiting, or diarrhea. 10/07/2021 upon evaluation today patient appears to be doing well currently in regard to his heel ulcer. He has been tolerating the dressing changes without complication. Fortunately I do not see any signs of active infection at this time which is great news. No fevers, chills, nausea, vomiting, or diarrhea. 12/27; wound is measuring smaller. We have been using silver alginate 10/28/2021 upon evaluation today patient appears to be doing well currently in regard to his heel ulcer. I am actually very pleased with where things stand and I think he is making excellent progress. Fortunately I do not see any signs of active infection locally nor systemically at this point which is great news. Nonetheless I do believe that the patient would benefit from a new offloading shoe and has been using it Velcro is no longer functioning properly and he tells me that he almost tripped and fell because of that today. Obviously I do not want him following that would be very bad. 11/11/2021 upon evaluation today patient appears to be doing excellent in regard to his wound. In fact this appears to be completely healed based on what I see currently. I do not see any signs of anything open or draining and I did double check just by clearing away some of the callus around the edges of the wound to ensure that there was nothing still open or hiding underneath. Once this was cleared away  it appeared that the patient was doing significantly better at this time which is great news and there was nothing actually open. Readmission: 06-19-2022 upon evaluation today patient appears to be doing well currently in regard to his heel ulcer all things considered. He unfortunately is having a bit of breakdown in general as far as the heel is concerned not nearly as bad as what we noted last time he was here in the clinic. The good news is I do think that this should hopefully heal much more rapidly than what we noted last time. His past medical history has not changed he is on Coumadin. 07-03-2022 upon evaluation today patient appears to be doing better in regard to his heel. We will start to see some improvement here which is good news. Fortunately I do not see any evidence of infection locally or systemically at this time which is great news. No fevers, chills, nausea, vomiting, or diarrhea. 07-10-2022 upon evaluation today patient appears to be doing well currently in regard to his wound from the callus standpoint around the edges. Unfortunately from an actual wound bed standpoint he has some deep tissue injury noted at this point. Again I am not sure exactly what is going on that is causing this pressure to the region but he is definitely gotten pressure that is occurring and causing this to not heal as effectively as what we would like to see. I discussed with him today that he may need  to at night when he sleeping use a pillow up underneath his calf in order to prevent the heel from touching the bed at all. I also think that it may be beneficial for him to monitor throughout his day and make sure there is no other time when he is getting the pressure to the heel. 07-23-2022 upon evaluation today patient appears to be doing poorly currently in regard to his heel ulcer. He has been tolerating the dressing changes without complication. Unfortunately I feel like he may be infected based on what I am  seeing. [name], [name] ([id number]) [id number] Page 8 of 10 07-28-2022 I did review patient's culture results which showed positive for Proteus as well as Staphylococcus both of which would be treated with the Bactrim DS that I gave him at the last visit. This is good news and hopefully means that we should be able to apply the cast today as long as the wound itself does not appear to be doing poorly. Patient's wound bed actually showed signs of doing quite well and I am very pleased with where things stand and I do not see any signs of worsening overall which is great news. 08-04-2022 upon evaluation today patient appears to be doing well currently in regard to his heel ulcer. I am actually very pleased with where things stand and I do think this is looking significantly better. With regard to his right shin that is also showing signs of improvement which is great news. 08-10-2022 upon evaluation today patient's wound on the heel actually appears to be doing significantly better. In regard to the right anterior shin this is showing signs of healing it might even be completely healed but I still get a monitor I cannot get anything to fill away but also could not see where there was anything actually draining at this point. I am tending to think healed but I want a monitor 1 more week before I call it for sure. 08-17-2022 upon evaluation today patient appears to be doing well currently in regard to his heel. Fortunately he is tolerating the dressing changes without complication. I do not see any signs of infection and overall I think he is making good progress here. We did get approval for the Apligraf but I think he had a $295 co-pay that would have to be paid per application. With that being said as good as he is doing right now I do not think that is necessary but is still an option depending on how things progress if we need to speed this up quite a bit. 08/24/2022;  this patient has a wound on his left heel in the setting of type 2 diabetes. We have been using Prisma. He has a heel offloading boot 08-31-2022 upon evaluation today patient appears to be doing poorly in regard to his heel ulcer which is still showing signs of bruising I am just not as happy as I was when I saw him 2 weeks ago with this. He saw Dr. Leanord Hawking last week. Also did not look good at that point apparently. Nonetheless I think that the patient is still continuing to have some counterpressure getting to the wound bed unfortunately. 09-07-2022 upon evaluation today patient appears to be doing well currently in regard to his heel ulcer. He has been tolerating the dressing changes without complication. Fortunately there does not appear to be any signs of active infection locally or systemically at this time. Fortunately I do not see any evidence of active  infection at this time. 09-15-2022 upon evaluation today patient appears to be doing well currently in regard to his legs which in fact appear to be completely healed this is great news. With that being said his heel does have some erythema and warmth as well as drainage I am concerned about cellulitis at this location. 09-21-2022 upon evaluation today patient's wound is actually showing signs of being a little bit smaller but still we are not doing nearly as well as what I would like to see. Fortunately I do not see any signs of infection at this time which is great news and overall I am extremely pleased in that regard. With that being said I do think that he needs to continue to elevate his leg although the edema is under great control with a compression wrap I think switching to a different dressing topically may be beneficial. My suggestion is to switch him over to a Iodoflex dressing. 09-28-2022 upon evaluation today patient appears to be doing well with regard to his heel ulcer. This is actually showing signs of improvement which is  great news and overall I am extremely pleased with where we stand today. 10-06-2022 upon evaluation today patient actually appears to be making excellent progress at this point. Fortunately there does not appear to be any signs of active infection locally nor systemically which is great news and overall I am extremely pleased with where we stand. I do feel like he is actually making some pretty good progress here as we switch to the wrap along with the Iodoflex. 12/26; patient has a wound on the tip of his left heel. This is not a weightbearing surface. He is using a heel offloading boot and carefully offloading this at other times during the day. He is using Iodoflex and Zetuvitunder 3 layer compression 10-22-2022 upon evaluation today patient appears to be doing well currently in regard to his wound. He has been tolerating the dressing changes without complication. Fortunately there does not appear to be any signs of active infection locally nor systemically at this time. No fevers, chills, nausea, vomiting, or diarrhea. 08-30-2023 upon evaluation today patient appears to be doing well currently in regard to his heel ulcer which is showing signs of good improvement. Fortunately I do not see any evidence of infection locally nor systemically which is great news and overall I am extremely pleased with where we stand. No fevers, chills, nausea, vomiting, or diarrhea. Unfortunately he does have a wound on the side of his fifth toe where I think this did rub on the shoe. 11-05-2022 upon evaluation today patient still still showing some signs of pressure at this point and there may have even been some fluid collection at this time. Fortunately I do not see any evidence of active infection locally nor systemically which is great news and overall I am extremely pleased with where we stand. No fevers, chills, nausea, vomiting, or diarrhea. 1/25; the patient's area on the dorsal aspect of the left fifth toe is  healed. We have been using Iodoflex to the area on the left heel. The lateral wound is slightly down in length. 11-19-2022 upon evaluation today patient appears to be doing well currently in regard to his wound. He has been tolerating the dressing changes without complication. Fortunately there does not appear to be any signs of infection we been using Iodoflex up to this point. 11-26-2022 upon evaluation today patient appears to be doing poorly currently in regard to his wound. He has  been tolerating the dressing changes with the Iodoflex without complication. With that being said he does not have any signs of active infection systemically though locally I do feel like there is some evidence of infection here. 12-03-2022 upon evaluation today patient appears to be doing well currently in regard to his wound this is better with the Bactrim compared to last week still there was some significant slough and biofilm buildup which is going require sharp debridement at this point. Fortunately I do not see any signs of active infection locally nor systemically which is great news. 12-11-2022 upon evaluation today patient actually appears to be doing significantly better at this time in regard to his heel. I am actually very pleased with where things stand currently. There is no signs of active infection locally or systemically at this point. 12-17-2022 upon evaluation today patient appears to be doing better as far as the overall appearance of his wound. I am actually very pleased in that regard. Fortunately there does not appear to be any signs of active infection locally nor systemically at this time. I do feel like that the patient is showing signs of excellent improvement in general. Nonetheless he needs something better for compression I think he should go ahead and see about getting compression socks. 3/7; tip of the left heel measurements are smaller he has been using Hydrofera Blue 01-07-2023 upon  evaluation today patient appears to be doing well currently in regard to his wound. This has not been debrided since I last saw him and it definitely shows that definitely needs some debridement today. Fortunately I do not see any evidence of active infection locally nor systemically which is great news. 01-14-2023 upon evaluation today patient appears to be doing well currently although he does have some erythema and warmth to the heel I feel like he may have developed an infection yet again. This is something that is been ongoing issue we have been trying to manage his foot best we could. With that being said I do think he may need to go back on the Bactrim this has done well for him in the past. 01-21-2023 upon evaluation today patient appears to be doing better in regard to his heel. He is doing very well with the antibiotics and that is made a big difference for him based on what I am seeing I am very pleased with where we stand today. He is obviously doing extremely well which is great news. [name], [name] ([id number]) [id number] Page 9 of 10 01-28-2023 upon evaluation today patient appears to be doing well currently in regard to his wound. This is actually showing signs of excellent improvement I am actually very pleased with where we stand and I do believe that he is making good progress here. Fortunately I do not see any evidence of active infection locally nor systemically which is great news. No fevers, chills, nausea, vomiting, or diarrhea. 02-04-2023 upon evaluation today patient's wound is actually showed signs of excellent improvement I am actually very pleased with where we stand. I do not see any signs of active infection locally nor systemically which is great news and in general I do believe that we are moving in the right direction here. No fevers, chills, nausea, vomiting, or diarrhea. 02-11-2023 upon evaluation patient's wound actually showing signs of  excellent improvement I am actually very pleased with where we stand I think that his pain is better there is no signs of infection we will continue to use the  gentamicin and overall we are making excellent progress. Objective Constitutional Well-nourished and well-hydrated in no acute distress. Vitals Time Taken: 1:58 PM, Height: 70 in, Weight: 265 lbs, BMI: 38, Temperature: 98.4 F, Pulse: 64 bpm, Respiratory Rate: 18 breaths/min, Blood Pressure: 146/77 mmHg. Respiratory normal breathing without difficulty. Psychiatric this patient is able to make decisions and demonstrates good insight into disease process. Alert and Oriented x 3. pleasant and cooperative. General Notes: Upon inspection patient's wound bed actually showed signs of good granulation and epithelization at this point. I do think that he does not need any sharp debridement today which is great news. Overall I think that he is really doing quite well. Integumentary (Hair, Skin) Wound #10 status is Open. Original cause of wound was Gradually Appeared. The date acquired was: 06/05/2022. The wound has been in treatment 33 weeks. The wound is located on the Left Calcaneus. The wound measures 1cm length x 2.5cm width x 0.1cm depth; 1.963cm^2 area and 0.196cm^3 volume. There is Fat Layer (Subcutaneous Tissue) exposed. There is a medium amount of serosanguineous drainage noted. The wound margin is flat and intact. There is small (1-33%) pink granulation within the wound bed. There is a large (67-100%) amount of necrotic tissue within the wound bed including Adherent Slough. 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 Bathing/ Shower/ Hygiene: May shower; gently cleanse wound with  antibacterial soap, rinse and pat dry prior to dressing wounds Non-Wound Condition: Additional non-wound orders/instructions: - Apply 40% Urea cream to dry cracked skin on feet. Off-Loading: Open toe surgical shoe with peg assist. WOUND #10: - Calcaneus Wound Laterality: Left Topical: Gentamicin 3 x Per Week/30 Days Discharge Instructions: Apply as directed by provider. Prim Dressing: Hydrofera Blue Ready Transfer Foam, 2.5x2.5 (in/in) (Dispense As Written) 3 x Per Week/30 Days ary Discharge Instructions: Apply Hydrofera Blue Ready to wound bed as directed Secondary Dressing: ABD Pad 5x9 (in/in) (Generic) 3 x Per Week/30 Days Discharge Instructions: Cover with ABD pad Secured With: Kerlix Roll Sterile or Non-Sterile 6-ply 4.5x4 (yd/yd) 3 x Per Week/30 Days Discharge Instructions: Apply Kerlix as directed Secured With: Tubigrip Size D, 3x10 (in/yd) 3 x Per Week/30 Days 1. I would recommend at this point based on what I am seeing that we go ahead and have the patient continue with the wound care measures as before and he is in agreement the plan. This includes the use of the [facility] along with the gentamicin. AYAAN, SHUTES ([id number]) [id number] Page 10 of 10 2. I am also can recommend we continue with ABD pad to cover roll gauze and secure in place. 3. Will continue with the Tubigrip which is doing pretty well for him at this point. We will see patient back for reevaluation in 1 week here in the clinic. If anything worsens or changes patient will contact our office for additional recommendations. Electronic Signature(s) Signed: 02/11/2023 5:11:16 PM By: [name] Derry PA-C Entered By: [name] on 02/11/2023 17:11:16 -------------------------------------------------------------------------------- SuperBill Details Patient Name: Date of Service: [name], [name] [name] J. 02/11/2023 Medical Record Number: [id number] Patient Account Number: [id number] Date of  Birth/Sex: Treating RN: 03-15-1946 (77 y.o. [name] Primary Care Provider: Aram [name] Other Clinician: Betha [name] Referring Provider: Treating Provider/Extender: [name] Weeks in Treatment: 33 Diagnosis Coding ICD-10 Codes  Code Description E11.621 Type 2 diabetes mellitus with foot ulcer 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sed E11.42 Type 2 diabetes mellitus with diabetic polyneuropathy Facility Procedures : CPT4 Code: [id number] Description: 99213 - WOUND CARE VISIT-LEV 3 EST PT Modifier: Quantity: 1 Physician Procedures : CPT4 Code Description Modifier [id number] 99213 - WC PHYS LEVEL 3 - EST PT ICD-10 Diagnosis Description E11.621 Type 2 diabetes mellitus with foot ulcer 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sed E11.42 Type 2  diabetes mellitus with diabetic polyneuropathy Quantity: 1 Electronic Signature(s) Signed: 02/11/2023 5:12:15 PM By: [name] Derry PA-C Entered By: [name] on 02/11/2023 17:12:14

## 2023-02-13 NOTE — Progress Notes (Signed)
LEMOYNE, NESTOR ([id number]) [id number] Page 1 of 8 Visit Report for 02/11/2023 Arrival Information Details Patient Name: Date of Service: [name], GEO [facility] J. 02/11/2023 1:45 PM Medical Record Number: [id number] Patient Account Number: [id number] Date of Birth/Sex: Treating RN: 1946/08/13 (77 y.o. [name] Primary Care Josecarlos Harriott: [name] Other Clinician: Betha Loa Referring Rondey Fallen: Treating Velvet Moomaw/Extender: [name] in Treatment: 33 Visit Information History Since Last Visit All ordered tests and consults were completed: No Patient Arrived: [name] Added or deleted any medications: No Arrival Time: 13:49 Any new allergies or adverse reactions: No Transfer Assistance: None Had a fall or experienced change in No Patient Identification Verified: Yes activities of daily living that may affect Secondary Verification Process Completed: Yes risk of falls: Patient Requires Transmission-Based Precautions: No Signs or symptoms of abuse/neglect since No Patient Has Alerts: Yes last visito Patient Alerts: Patient on Blood Thinner Hospitalized since last visit: No Warfarin Implantable device outside of the clinic No Type II Diabetic excluding cellular tissue based products placed in the center since last visit: Has Dressing in Place as Prescribed: Yes Has Compression in Place as Prescribed: Yes Has Footwear/Offloading in Place as Yes Prescribed: Left: Surgical Shoe with Pressure Relief Insole Pain Present Now: Yes Electronic Signature(s) Signed: 02/12/2023 1:22:31 PM By: Betha Loa Entered By: Betha Loa on 02/11/2023 13:56:52 -------------------------------------------------------------------------------- Clinic Level of Care Assessment Details Patient Name: Date of Service: [name] J. 02/11/2023 1:45 PM Medical Record Number: [id number] Patient Account Number: [id number] Date of Birth/Sex:  Treating RN: 1945/12/18 (77 y.o. [name], [name] Primary Care Kiana Hollar: [name] Other Clinician: Betha Loa Referring Dorlis Judice: Treating Charlot Gouin/Extender: [name] in Treatment: 33 Clinic Level of Care Assessment Items TOOL 4 Quantity Score []  - 0 Use when only an EandM is performed on FOLLOW-UP visit ASSESSMENTS - Nursing Assessment / Reassessment X- 1 10 Reassessment of Co-morbidities (includes updates in patient status) X- 1 5 Reassessment of Adherence to Treatment Plan ASSESSMENTS - Wound and Skin A ssessment / Reassessment X - Simple Wound Assessment / Reassessment - one wound 1 5 []  - 0 Complex Wound Assessment / Reassessment - multiple wounds []  - 0 Dermatologic / Skin Assessment (not related to wound area) ASSESSMENTS - Focused Assessment []  - 0 Circumferential Edema Measurements - multi extremities []  - 0 Nutritional Assessment / Counseling / Intervention []  - 0 Lower Extremity Assessment (monofilament, tuning fork, pulses) []  - 0 Peripheral Arterial Disease Assessment (using hand held doppler) DEAVIN, FORST ([id number]) [id number] Page 2 of 8 ASSESSMENTS - Ostomy and/or Continence Assessment and Care []  - 0 Incontinence Assessment and Management []  - 0 Ostomy Care Assessment and Management (repouching, etc.) PROCESS - Coordination of Care X - Simple Patient / Family Education for ongoing care 1 15 []  - 0 Complex (extensive) Patient / Family Education for ongoing care []  - 0 Staff obtains Chiropractor, Records, T Results / Process Orders est []  - 0 Staff telephones HHA, Nursing Homes / Clarify orders / etc []  - 0 Routine Transfer to another Facility (non-emergent condition) []  - 0 Routine Hospital Admission (non-emergent condition) []  - 0 New Admissions / Manufacturing engineer / Ordering NPWT Apligraf, etc. , []  - 0 Emergency Hospital Admission (emergent condition) X- 1 10 Simple  Discharge Coordination []  - 0 Complex (extensive) Discharge Coordination PROCESS - Special Needs []  - 0 Pediatric / Minor Patient Management []  - 0 Isolation Patient Management []  - 0 Hearing / Language / Visual special needs []  -  0 Assessment of Community assistance (transportation, D/C planning, etc.) []  - 0 Additional assistance / Altered mentation []  - 0 Support Surface(s) Assessment (bed, cushion, seat, etc.) INTERVENTIONS - Wound Cleansing / Measurement X - Simple Wound Cleansing - one wound 1 5 []  - 0 Complex Wound Cleansing - multiple wounds X- 1 5 Wound Imaging (photographs - any number of wounds) []  - 0 Wound Tracing (instead of photographs) X- 1 5 Simple Wound Measurement - one wound []  - 0 Complex Wound Measurement - multiple wounds INTERVENTIONS - Wound Dressings []  - 0 Small Wound Dressing one or multiple wounds X- 1 15 Medium Wound Dressing one or multiple wounds []  - 0 Large Wound Dressing one or multiple wounds []  - 0 Application of Medications - topical []  - 0 Application of Medications - injection INTERVENTIONS - Miscellaneous []  - 0 External ear exam []  - 0 Specimen Collection (cultures, biopsies, blood, body fluids, etc.) []  - 0 Specimen(s) / Culture(s) sent or taken to Lab for analysis []  - 0 Patient Transfer (multiple staff / Nurse, adult / Similar devices) []  - 0 Simple Staple / Suture removal (25 or less) []  - 0 Complex Staple / Suture removal (26 or more) []  - 0 Hypo / Hyperglycemic Management (close monitor of Blood Glucose) []  - 0 Ankle / Brachial Index (ABI) - do not check if billed separately X- 1 5 Vital Signs EUCLID, CASSETTA ([id number]) [id number] Page 3 of 8 Has the patient been seen at the hospital within the last three years: Yes Total Score: 80 Level Of Care: New/Established - Level 3 Electronic Signature(s) Signed: 02/12/2023 1:22:31 PM By: Betha Loa Entered By: Betha Loa on  02/11/2023 14:34:51 -------------------------------------------------------------------------------- Encounter Discharge Information Details Patient Name: Date of Service: [name], [name] 02/11/2023 1:45 PM Medical Record Number: [id number] Patient Account Number: [id number] Date of Birth/Sex: Treating RN: Dec 17, 1945 (77 y.o. [name] Primary Care Aella Ronda: [name] Other Clinician: Betha Loa Referring Burak Zerbe: Treating Novah Goza/Extender: [name] in Treatment: 33 Encounter Discharge Information Items Discharge Condition: Stable Ambulatory Status: Cane Discharge Destination: Home Transportation: Private Auto Accompanied By: self Schedule Follow-up Appointment: Yes Clinical Summary of Care: Electronic Signature(s) Signed: 02/12/2023 1:22:31 PM By: Betha Loa Entered By: Betha Loa on 02/11/2023 14:51:58 -------------------------------------------------------------------------------- Lower Extremity Assessment Details Patient Name: Date of Service: [name] J. 02/11/2023 1:45 PM Medical Record Number: [id number] Patient Account Number: [id number] Date of Birth/Sex: Treating RN: May 07, 1946 (77 y.o. [name] Primary Care Zannie Locastro: [name] Other Clinician: Betha Loa Referring Amberlyn Martinezgarcia: Treating Tamyra Fojtik/Extender: Hermine Messick Weeks in Treatment: 33 Edema Assessment Assessed: [Left: Yes] [Right: No] Edema: [Left: Ye] [Right: s] Calf Left: Right: Point of Measurement: 33 cm From Medial Instep 39.5 cm Ankle Left: Right: Point of Measurement: 11 cm From Medial Instep 23.5 cm Knee To Floor Left: Right: From Medial Instep 42 cm Vascular Assessment Pulses: Dorsalis Pedis Palpable: [Left:Yes] Electronic Signature(s) Signed: 02/11/2023 3:23:16 PM By: Elliot Gurney, BSN, RN, CWS, [name] RN, BSN Signed: 02/12/2023 1:22:31 PM By: Lawana Pai ([id number])  [id number] Page 4 of 8 Entered By: Betha Loa on 02/11/2023 14:14:42 -------------------------------------------------------------------------------- Multi Wound Chart Details Patient Name: Date of Service: [name] J. 02/11/2023 1:45 PM Medical Record Number: [id number] Patient Account Number: [id number] Date of Birth/Sex: Treating RN: 1946/06/04 (77 y.o. [name] Primary Care Tahji : [name] Other Clinician: Betha Loa Referring Tell Rozelle: Treating Bennett Ram/Extender: Hermine Messick Weeks in Treatment: 84 Vital  Signs Height(in): 70 Pulse(bpm): 64 Weight(lbs): 265 Blood Pressure(mmHg): 146/77 Body Mass Index(BMI): 38 Temperature(F): 98.4 Respiratory Rate(breaths/min): 18 [10:Photos:] [N/A:N/A] Left Calcaneus N/A N/A Wound Location: Gradually Appeared N/A N/A Wounding 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 N/A N/A Secondary Etiology: Cataracts, Arrhythmia, Coronary N/A N/A Comorbid History: Artery Disease, Hypertension, Type II Diabetes, Osteoarthritis, Neuropathy 06/05/2022 N/A N/A Date Acquired: 19 N/A N/A Weeks of Treatment: Open N/A N/A Wound Status: No N/A N/A Wound Recurrence: 1x2.5x0.1 N/A N/A Measurements L x W x D (cm) 1.963 N/A N/A A (cm) : rea 0.196 N/A N/A Volume (cm) : -38.80% N/A N/A % Reduction in A rea: 30.70% N/A N/A % Reduction in Volume: Grade 1 N/A N/A Classification: Medium N/A N/A Exudate A mount: Serosanguineous N/A N/A Exudate Type: red, brown N/A N/A Exudate Color: Flat and Intact N/A N/A Wound Margin: Small (1-33%) N/A N/A Granulation A mount: Pink N/A N/A Granulation Quality: Large (67-100%) N/A N/A Necrotic A mount: Fat Layer (Subcutaneous Tissue): Yes N/A N/A Exposed Structures: Fascia: No Tendon: No Muscle: No Joint: No Bone: No Small (1-33%) N/A N/A Epithelialization: Treatment  Notes Electronic Signature(s) Signed: 02/12/2023 1:22:31 PM By: Betha Loa Entered By: Betha Loa on 02/11/2023 14:14:52 -------------------------------------------------------------------------------- Multi-Disciplinary Care Plan Details Patient Name: Date of Service: [name], [name] 02/11/2023 1:45 PM Barrie Folk ([id number]) [id number] Page 5 of 8 Medical Record Number: [id number] Patient Account Number: [id number] Date of Birth/Sex: Treating RN: 12-28-1945 (77 y.o. [name], [name] Primary Care Dajuan Turnley: [name] Other Clinician: Betha Loa Referring Albertha Beattie: Treating Lucas Winograd/Extender: Hermine Messick Weeks in Treatment: 33 Active Inactive Wound/Skin Impairment Nursing Diagnoses: Impaired tissue integrity Goals: Patient/caregiver will verbalize understanding of skin care regimen Date Initiated: 06/19/2022 Date Inactivated: 07/10/2022 Target Resolution Date: 06/19/2022 Goal Status: Met Ulcer/skin breakdown will have a volume reduction of 30% by week 4 Date Initiated: 06/19/2022 Date Inactivated: 07/28/2022 Target Resolution Date: 07/17/2022 Goal Status: Met Ulcer/skin breakdown will have a volume reduction of 50% by week 8 Date Initiated: 07/28/2022 Target Resolution Date: 02/17/2023 Goal Status: Active Interventions: Assess patient/caregiver ability to obtain necessary supplies Assess patient/caregiver ability to perform ulcer/skin care regimen upon admission and as needed Assess ulceration(s) every visit Provide education on ulcer and skin care Treatment Activities: Skin care regimen initiated : 06/19/2022 Topical wound management initiated : 06/19/2022 Notes: Electronic Signature(s) Signed: 02/11/2023 3:23:16 PM By: Elliot Gurney, BSN, RN, CWS, [name] RN, BSN Signed: 02/12/2023 1:22:31 PM By: Betha Loa Entered By: Betha Loa on 02/11/2023  14:35:07 -------------------------------------------------------------------------------- Pain Assessment Details Patient Name: Date of Service: [name], [name] 02/11/2023 1:45 PM Medical Record Number: [id number] Patient Account Number: [id number] Date of Birth/Sex: Treating RN: 06/20/1946 (77 y.o. [name] Primary Care Aniesa Boback: [name] Other Clinician: Betha Loa Referring Dorien Bessent: Treating Bhavika Schnider/Extender: [name] in Treatment: 33 Active Problems Location of Pain Severity and Description of Pain Patient Has Paino No Site Locations Duration of the Pain. IMMANUEL, FEDAK ([id number]) [id number] Page 6 of 8 Duration of the Pain. Constant / Intermittento Constant Rate the pain. Current Pain Level: 7 Character of Pain Describe the Pain: Burning, Other: prickly Pain Management and Medication Current Pain Management: Medication: No Cold Application: No Rest: No Massage: No Activity: No T.E.N.S.: No Heat Application: No Leg drop or elevation: No Is the Current Pain Management Adequate: Inadequate How does your wound impact your activities of daily livingo Sleep: Yes Bathing: No Appetite: No Relationship With Others: No  Bladder Continence: No Emotions: No Bowel Continence: No Work: No Toileting: No Drive: No Dressing: No Hobbies: No Psychologist, prison and probation services) Signed: 02/11/2023 3:23:16 PM By: Elliot Gurney, BSN, RN, CWS, [name] RN, BSN Signed: 02/12/2023 1:22:31 PM By: Betha Loa Entered By: Betha Loa on 02/11/2023 14:03:53 -------------------------------------------------------------------------------- Patient/Caregiver Education Details Patient Name: Date of Service: [name], GEO Cherrie Gauze 4/25/2024andnbsp1:45 PM Medical Record Number: [id number] Patient Account Number: [id number] Date of Birth/Gender: Treating RN: [birth year] (77 y.o. [name] Primary Care Physician: [name] Other  Clinician: Betha Loa Referring Physician: Treating Physician/Extender: [name] in Treatment: 73 Education Assessment Education Provided To: Patient Education Topics Provided Wound/Skin Impairment: Handouts: Other: continue wound care as directed Methods: Explain/Verbal Responses: State content correctly Electronic Signature(s) Signed: 02/12/2023 1:22:31 PM By: Betha Loa Entered By: Betha Loa on 02/11/2023 14:35:29 Barrie Folk ([id number]) [id number] Page 7 of 8 -------------------------------------------------------------------------------- Wound Assessment Details Patient Name: Date of Service: [name], [name] 02/11/2023 1:45 PM Medical Record Number: [id number] Patient Account Number: [id number] Date of Birth/Sex: Treating RN: 31-Oct-1945 (77 y.o. [name], [name] Primary Care Sadrac Zeoli: [name] Other Clinician: Betha Loa Referring Eryanna Regal: Treating Gizel Riedlinger/Extender: Hermine Messick Weeks in Treatment: 33 Wound Status Wound Number: 10 Primary Diabetic Wound/Ulcer of the Lower Extremity Etiology: Wound Location: Left Calcaneus Secondary Pressure Ulcer Wounding Event: Gradually Appeared Etiology: Date Acquired: 06/05/2022 Wound Open Weeks Of Treatment: 33 Status: Clustered Wound: No Comorbid Cataracts, Arrhythmia, Coronary Artery Disease, Hypertension, History: Type II Diabetes, Osteoarthritis, Neuropathy Photos Wound Measurements Length: (cm) 1 Width: (cm) 2.5 Depth: (cm) 0.1 Area: (cm) 1.963 Volume: (cm) 0.196 % Reduction in Area: -38.8% % Reduction in Volume: 30.7% Epithelialization: Small (1-33%) Wound Description Classification: Grade 1 Wound Margin: Flat and Intact Exudate Amount: Medium Exudate Type: Serosanguineous Exudate Color: red, brown Foul Odor After Cleansing: No Slough/Fibrino Yes Wound Bed Granulation Amount: Small (1-33%) Exposed  Structure Granulation Quality: Pink Fascia Exposed: No Necrotic Amount: Large (67-100%) Fat Layer (Subcutaneous Tissue) Exposed: Yes Necrotic Quality: Adherent Slough Tendon Exposed: No Muscle Exposed: No Joint Exposed: No Bone Exposed: No Treatment Notes Wound #10 (Calcaneus) Wound Laterality: Left Cleanser Peri-Wound Care Topical Gentamicin Discharge Instruction: Apply as directed by Laryn Venning. Primary Dressing Hydrofera Blue Ready Transfer Foam, 2.5x2.5 (in/in) Discharge Instruction: Apply Hydrofera Blue Ready to wound bed as directed Secondary Dressing GIAN, YBARRA ([id number]) [id number] Page 8 of 8 ABD Pad 5x9 (in/in) Discharge Instruction: Cover with ABD pad Secured With Kerlix Roll Sterile or Non-Sterile 6-ply 4.5x4 (yd/yd) Discharge Instruction: Apply Kerlix as directed Tubigrip Size D, 3x10 (in/yd) Compression Wrap Compression Stockings Add-Ons Electronic Signature(s) Signed: 02/11/2023 3:23:16 PM By: Elliot Gurney, BSN, RN, CWS, [name] RN, BSN Signed: 02/12/2023 1:22:31 PM By: Betha Loa Entered By: Betha Loa on 02/11/2023 14:13:03 -------------------------------------------------------------------------------- Vitals Details Patient Name: Date of Service: [name], [name] 02/11/2023 1:45 PM Medical Record Number: [id number] Patient Account Number: [id number] Date of Birth/Sex: Treating RN: 1946-04-05 (77 y.o. [name], [name] Primary Care Kiwanna Spraker: [name] Other Clinician: Betha Loa Referring Jaquesha Boroff: Treating Rilley Stash/Extender: Hermine Messick Weeks in Treatment: 33 Vital Signs Time Taken: 13:58 Temperature (F): 98.4 Height (in): 70 Pulse (bpm): 64 Weight (lbs): 265 Respiratory Rate (breaths/min): 18 Body Mass Index (BMI): 38 Blood Pressure (mmHg): 146/77 Reference Range: 80 - 120 mg / dl Electronic Signature(s) Signed: 02/12/2023 1:22:31 PM By: Betha Loa Entered By: Betha Loa on  02/11/2023 14:03:47

## 2023-02-16 ENCOUNTER — Ambulatory Visit: Payer: Medicare HMO | Admitting: Neurology

## 2023-02-16 ENCOUNTER — Encounter: Payer: Self-pay | Admitting: Neurology

## 2023-02-16 VITALS — BP 139/67 | HR 68 | Ht 70.0 in | Wt 271.5 lb

## 2023-02-16 DIAGNOSIS — R269 Unspecified abnormalities of gait and mobility: Secondary | ICD-10-CM | POA: Diagnosis not present

## 2023-02-16 DIAGNOSIS — G629 Polyneuropathy, unspecified: Secondary | ICD-10-CM

## 2023-02-16 DIAGNOSIS — E1149 Type 2 diabetes mellitus with other diabetic neurological complication: Secondary | ICD-10-CM | POA: Diagnosis not present

## 2023-02-16 NOTE — Patient Instructions (Addendum)
Increase Duloxetine to 60 mg in the morning  Increase Pregabalin to 450 mg at bedtime  Trial of lidocaine cream  Please contact me in a month for update  Not interested in surgical evaluation for spinal cord stimulator.  Continue your other medications  Follow up in 6 months or sooner if worse

## 2023-02-16 NOTE — Progress Notes (Signed)
[facility]  PATIENT: [name] DOB: 02/23/1946  REQUESTING CLINICIAN: Marguarite Arbour, MD HISTORY FROM: Patient and spouse  REASON FOR VISIT: Dizziness, neuropathy    HISTORICAL  CHIEF COMPLAINT:  Chief Complaint  Patient presents with   Follow-up    Rm 12, wife present (margaret) Peripheral polyneuropathy:getting worse w/feet on top primarily still consistent on the bottoms as well, Gait abnormality: dizziness getting worse,Other diabetic neurological complication associated with type 2 diabetes mellitus       INTERVAL HISTORY 02/16/2023:  Patient presents today for follow-up, he is accompanied by his wife.  Reports that his neuropathic pain is still present and is very painful.  He is on duloxetine 30 mg in the morning and also pregabalin 150 in the morning and 300 at night.  He reports that this is not enough to control his pain.  He reports in the past, 12 years ago he did have spinal cord stimulator for neuropathic pain but did not like the results, he is not interested in another evaluation for spinal cord stimulation for neuropathic pain.  He is willing to make some changes to his medication but no surgical procedure.   INTERVAL HISTORY 08/17/22:  Patient presents today for follow-up, at last visit in June we had discontinued his Trileptal and gabapentin and start him on pregabalin.  Since then he has been doing well.  Reports that 150 twice a day of pregabalin has been helpful but at night he still experience neuropathic pain. He is still on duloxetine.  He also reports in the past, he was trialed for a spinal cord stimulator for neuropathy but did not tolerated therefore did not move forward with the procedure.  Still uses a cane with ambulation, denies any falls.   INTERVAL HISTORY 04/13/22 [name] presented today for follow-up, at last visit I started her on Trileptal for the peripheral neuropathy since he was already on gabapentin and  duloxetine.  He states that his pain is not improving and actually worse in the evening when sitting down and relaxing.  He would like to try something else for the pain because this will bothering him, again described as burning, stabbing, numbing pain.  Currently he is using a walker, denies any recent falls.     HISTORY OF PRESENT ILLNESS:  This is a 77 year old gentleman with past medical history including atrial fibrillation, peripheral neuropathy, history of stroke, hypertension, hyperlipidemia, and diabetes mellitus type 2 who is presenting with complaint of dizziness.  Patient reports 4 years ago he was admitted, diagnosed with a stroke and since then has been having dizziness.  He described the dizziness as feeling lightheaded upon standing and starting to walk.  He denies any room spinning sensation and denies having the dizziness while sitting down or laying down.  It only occurs upon standing and walking.  He feels like he is going to fall.  Currently he uses a cane.  He has not completed physical therapy lately.   On top of that, he is complaining of peripheral neuropathy, diagnosed for more than 15 years.  He describes the pain as painful burning sensation, difficulty walking, cannot feel the ground.  He did have work-up in the past including EMG, nerve conduction conduction study and ultrasound of the leg  Diagnosis was neuropathy and he had tried multiple medication including TCAs, antidepressant, antiseizure medication including Neurontin and pregabalin.  Currently he is on Neurontin 600 mg twice daily and duloxetine 60 mg in the evening.  OTHER MEDICAL CONDITIONS: Atrial fibrillation, peripheral neuropathy, diabetes mellitus type 2, hypertension, hyperlipidemia, CAD, history of CVA   REVIEW OF SYSTEMS: Full 14 system review of systems performed and negative with exception of: as noted in the HPI  ALLERGIES: Allergies  Allergen Reactions   Tetracycline Hives    Other  Reaction(s): Not available   Tetracyclines & Related Hives    HOME MEDICATIONS: Outpatient Medications Prior to Visit  Medication Sig Dispense Refill   amiodarone (PACERONE) 200 MG tablet Take 1 tablet (200 mg total) by mouth 2 (two) times daily. 60 tablet 0   aspirin EC 81 MG tablet Take 81 mg by mouth daily.     cyanocobalamin (,VITAMIN B-12,) 1000 MCG/ML injection Inject 1,000 mcg into the muscle every 30 (thirty) days.     cyclobenzaprine (FLEXERIL) 10 MG tablet Take 10 mg by mouth daily as needed for muscle spasms.     DULoxetine (CYMBALTA) 60 MG capsule Take 30 mg by mouth every evening.     ferrous sulfate 325 (65 FE) MG tablet Take 1 tablet (325 mg total) by mouth 2 (two) times daily with a meal. 60 tablet 3   glipiZIDE (GLUCOTROL XL) 5 MG 24 hr tablet Take 5 mg by mouth daily.     loratadine (CLARITIN) 10 MG tablet Take 10 mg by mouth daily.     metFORMIN (GLUCOPHAGE) 1000 MG tablet Take 1,000 mg by mouth 2 (two) times daily. With meals.     methadone (DOLOPHINE) 5 MG tablet Take 5 mg by mouth 2 (two) times daily.      methimazole (TAPAZOLE) 10 MG tablet Take 20 mg by mouth daily.     Multiple Vitamin (MULTI-VITAMINS) TABS Take 1 tablet by mouth daily.     pantoprazole (PROTONIX) 40 MG tablet Take 40 mg by mouth daily.     pravastatin (PRAVACHOL) 40 MG tablet Take 40 mg by mouth every evening.      pregabalin (LYRICA) 150 MG capsule Take 1 capsule (150 mg total) by mouth daily AND 2 capsules (300 mg total) every evening. (Patient taking differently: 2 capsules (300 mg total) every evening.) 270 capsule 3   torsemide (DEMADEX) 20 MG tablet Take by mouth daily.     warfarin (COUMADIN) 2.5 MG tablet Take 2.5 mg by mouth. Take on Monday and Friday only     warfarin (COUMADIN) 5 MG tablet Take 5 mg by mouth daily. Take on Tuesday, Wednesday, Thursday, Saturday, and Sunday     No facility-administered medications prior to visit.    PAST MEDICAL HISTORY: Past Medical History:   Diagnosis Date   Anemia    CHF (congestive heart failure) (HCC)    Chronic pain    Coronary artery disease    DDD (degenerative disc disease), hip    Diabetes mellitus without complication (HCC)    DJD (degenerative joint disease)    Dysrhythmia    GERD (gastroesophageal reflux disease)    History of hip replacement    Hyperlipemia    Hypertension    Insomnia    Neuropathy    Paroxysmal A-fib (HCC)    Peripheral neuropathy    Stroke [facility])    Thoracic aortic atherosclerosis (HCC)     PAST SURGICAL HISTORY: Past Surgical History:  Procedure Laterality Date   BACK SURGERY     CARDIAC SURGERY     CARDIAC VALVE REPLACEMENT     CARDIOVERSION N/A 05/04/2017   Procedure: CARDIOVERSION;  Surgeon: Lamar Blinks, MD;  Location: [facility] ORS;  Service: Cardiovascular;  Laterality: N/A;   CARDIOVERSION N/A 09/04/2021   Procedure: CARDIOVERSION;  Surgeon: Lamar Blinks, MD;  Location: [facility] ORS;  Service: Cardiovascular;  Laterality: N/A;   carpel tunnel release     CATARACT EXTRACTION W/ INTRAOCULAR LENS IMPLANT Bilateral    COLONOSCOPY     COLONOSCOPY WITH ESOPHAGOGASTRODUODENOSCOPY (EGD)     COLONOSCOPY WITH PROPOFOL N/A 05/16/2018   Procedure: COLONOSCOPY WITH PROPOFOL;  Surgeon: Scot [name], MD;  Location: [facility] ENDOSCOPY;  Service: Endoscopy;  Laterality: N/A;   COLONOSCOPY WITH PROPOFOL N/A 08/06/2021   Procedure: COLONOSCOPY WITH PROPOFOL;  Surgeon: Toledo, Boykin Nearing, MD;  Location: [facility] ENDOSCOPY;  Service: Gastroenterology;  Laterality: N/A;   ESOPHAGOGASTRODUODENOSCOPY (EGD) WITH PROPOFOL N/A 05/16/2018   Procedure: ESOPHAGOGASTRODUODENOSCOPY (EGD) WITH PROPOFOL;  Surgeon: Scot [name], MD;  Location: [facility] ENDOSCOPY;  Service: Endoscopy;  Laterality: N/A;   JOINT REPLACEMENT     TEE WITHOUT CARDIOVERSION N/A 10/08/2016   Procedure: TRANSESOPHAGEAL ECHOCARDIOGRAM (TEE);  Surgeon: Dalia Heading, MD;  Location: [facility] ORS;  Service: Cardiovascular;  Laterality: N/A;    TEE WITHOUT CARDIOVERSION N/A 11/23/2016   Procedure: TRANSESOPHAGEAL ECHOCARDIOGRAM (TEE);  Surgeon: Marcina Millard, MD;  Location: [facility] ORS;  Service: Cardiovascular;  Laterality: N/A;   TONSILLECTOMY     VSD REPAIR      FAMILY HISTORY: Family History  Problem Relation Age of Onset   Stroke Mother    Heart attack Mother    Heart attack Father     SOCIAL HISTORY: Social History   Socioeconomic History   Marital status: Married    Spouse name: Not on file   Number of children: 2   Years of education: Not on file   Highest education level: Not on file  Occupational History   Not on file  Tobacco Use   Smoking status: Former    Types: Cigarettes    Quit date: 07/09/1996    Years since quitting: 26.6   Smokeless tobacco: Never  Vaping Use   Vaping Use: Never used  Substance and Sexual Activity   Alcohol use: Yes    Alcohol/week: 2.0 standard drinks of alcohol    Types: 1 Cans of beer, 1 Standard drinks or equivalent per week    Comment: occasional   Drug use: No   Sexual activity: Yes  Other Topics Concern   Not on file  Social History Narrative   Not on file   Social Determinants of Health   Financial Resource Strain: Not on file  Food Insecurity: Not on file  Transportation Needs: Not on file  Physical Activity: Not on file  Stress: Not on file  Social Connections: Not on file  Intimate Partner Violence: Not on file    PHYSICAL EXAM  GENERAL EXAM/CONSTITUTIONAL: Vitals:  Vitals:   02/16/23 1021  BP: 139/67  Pulse: 68  SpO2: 93%  Weight: 271 lb 8 oz (123.2 kg)  Height: 5\' 10"  (1.778 m)   Body mass index is 38.96 kg/m. Wt Readings from Last 3 Encounters:  02/16/23 271 lb 8 oz (123.2 kg)  08/17/22 277 lb (125.6 kg)  04/13/22 266 lb (120.7 kg)   Patient is in no distress; well developed, nourished and groomed; neck is supple  MUSCULOSKELETAL: Gait, strength, tone, movements noted in Neurologic exam below  NEUROLOGIC: MENTAL STATUS:       No data to display         awake, alert, oriented to person, place and time recent and remote memory intact normal attention  and concentration language fluent, comprehension intact, naming intact fund of knowledge appropriate  CRANIAL NERVE:  2nd, 3rd, 4th, 6th - pupils equal and reactive to light, visual fields full to confrontation, extraocular muscles intact, no nystagmus 5th - facial sensation symmetric 7th - facial strength symmetric 8th - hearing intact 9th - palate elevates symmetrically, uvula midline 11th - shoulder shrug symmetric 12th - tongue protrusion midline  MOTOR:  normal bulk and tone, full strength in the BUE, BLE  SENSORY:  Absent sensation to light touch, pinprick and vibration in the bilateral lower extremities up to knees and decrease sensation to  tips of fingers. There is absent proprioception. Absent hair in the BLEs.   COORDINATION:  finger-nose-finger, fine finger movements normal   GAIT/STATION:  Wide base, uses a cane, very antalgic, wobbly gait    DIAGNOSTIC DATA (LABS, IMAGING, TESTING) - I reviewed patient records, labs, notes, testing and imaging myself where available.  Lab Results  Component Value Date   WBC 8.3 09/08/2021   HGB 10.3 (L) 09/08/2021   HCT 33.9 (L) 09/08/2021   MCV 94.4 09/08/2021   PLT 297 09/08/2021      Component Value Date/Time   NA 137 08/21/2021 0505   NA 135 (L) 08/10/2013 0554   K 3.5 08/21/2021 0505   K 3.6 08/10/2013 0554   CL 104 08/21/2021 0505   CL 104 08/10/2013 0554   CO2 27 08/21/2021 0505   CO2 24 08/10/2013 0554   GLUCOSE 153 (H) 08/21/2021 0505   GLUCOSE 143 (H) 08/10/2013 0554   BUN 19 08/21/2021 0505   BUN 12 08/10/2013 0554   CREATININE 0.85 08/21/2021 0505   CREATININE 0.98 08/10/2013 0554   CALCIUM 7.8 (L) 08/21/2021 0505   CALCIUM 8.4 (L) 08/10/2013 0554   PROT 6.5 08/18/2021 0649   ALBUMIN 2.9 (L) 08/18/2021 0649   AST 18 08/18/2021 0649   ALT 15 08/18/2021 0649   ALKPHOS  71 08/18/2021 0649   BILITOT 0.3 08/18/2021 0649   GFRNONAA >60 08/21/2021 0505   GFRNONAA >60 08/10/2013 0554   GFRAA >60 12/28/2018 1229   GFRAA >60 08/10/2013 0554   Lab Results  Component Value Date   CHOL 163 12/17/2016   HDL 48 12/17/2016   LDLCALC 85 12/17/2016   TRIG 152 (H) 12/17/2016   CHOLHDL 3.4 12/17/2016   Lab Results  Component Value Date   HGBA1C 7.3 (H) 08/17/2021   Lab Results  Component Value Date   VITAMINB12 205 08/18/2021   Lab Results  Component Value Date   TSH 3.560 08/18/2021    MRI Brain 07/07/20 Normal aging brain.    ASSESSMENT AND PLAN  77 y.o. year old male with atrial fibrillation, peripheral neuropathy, history of stroke, hypertension, hyperlipidemia, and diabetes mellitus type 2 who is presenting for follow up for his neuropathy.  He was doing well on Duloxetine and Pregabalin but there is increase neuropathic pain, mainly in the evening. Plan will be to increase Duloxetine to 60 mg in the morning and Pregabalin to 450 mg in the evening. still experiences pain in the evening. Continue other medication, continue with exercise, continue to follow with PCP and return in 6 months or sooner if worse    1. Peripheral polyneuropathy   2. Other diabetic neurological complication associated with type 2 diabetes mellitus (HCC)   3. Gait abnormality      Patient Instructions  Increase Duloxetine to 60 mg in the morning  Increase Pregabalin to 450 mg at bedtime  Trial  of lidocaine cream  Please contact me in a month for update  Not interested in surgical evaluation for spinal cord stimulator.  Continue your other medications  Follow up in 6 months or sooner if worse     No orders of the defined types were placed in this encounter.   No orders of the defined types were placed in this encounter.   Return in about 6 months (around 08/18/2023).    Windell Norfolk, MD 02/16/2023, 4:32 PM  [facility] [address],  [address] [place], [place] [address] [phone]

## 2023-02-18 ENCOUNTER — Encounter: Payer: Medicare HMO | Attending: Physician Assistant | Admitting: Physician Assistant

## 2023-02-18 DIAGNOSIS — Z7901 Long term (current) use of anticoagulants: Secondary | ICD-10-CM | POA: Insufficient documentation

## 2023-02-18 DIAGNOSIS — E11621 Type 2 diabetes mellitus with foot ulcer: Secondary | ICD-10-CM | POA: Insufficient documentation

## 2023-02-18 DIAGNOSIS — E1142 Type 2 diabetes mellitus with diabetic polyneuropathy: Secondary | ICD-10-CM | POA: Diagnosis not present

## 2023-02-18 DIAGNOSIS — L97522 Non-pressure chronic ulcer of other part of left foot with fat layer exposed: Secondary | ICD-10-CM | POA: Insufficient documentation

## 2023-02-19 NOTE — Progress Notes (Signed)
SHADEN, HAUGHT ([id number]) [id number] Page 1 of 7 Visit Report for 02/18/2023 Arrival Information Details Patient Name: Date of Service: [name], [name] [facility] J. 02/18/2023 1:15 PM Medical Record Number: [id number] Patient Account Number: [id number] Date of Birth/Sex: Treating RN: December 03, 1945 (77 y.o. [name] Primary Care [name]: [name] Other Clinician: Referring [name]: Treating [name]/Extender: [name] in Treatment: 34 Visit Information History Since Last Visit Added or deleted any medications: No Patient Arrived: Walker Has Dressing in Place as Prescribed: Yes Arrival Time: 13:18 Pain Present Now: No Accompanied By: self Transfer Assistance: None Patient Identification Verified: Yes Secondary Verification Process Completed: Yes Patient Requires Transmission-Based Precautions: No Patient Has Alerts: Yes Patient Alerts: Patient on Blood Thinner Warfarin Type II Diabetic Electronic Signature(s) Unsigned Entered By: [name] on 02/18/2023 13:22:39 -------------------------------------------------------------------------------- Clinic Level of Care Assessment Details Patient Name: Date of Service: [name] [name] [facility] J. 02/18/2023 1:15 PM Medical Record Number: [id number] Patient Account Number: [id number] Date of Birth/Sex: Treating RN: 1945-12-03 (77 y.o. [name] Primary Care Azarya Oconnell: [name] Other Clinician: Referring [name]: Treating [name]/Extender: [name] in Treatment: 34 Clinic Level of Care Assessment Items TOOL 1 Quantity Score []  - 0 Use when EandM and Procedure is performed on INITIAL visit ASSESSMENTS - Nursing Assessment / Reassessment []  - 0 [name] Physical Exam (combine w/ comprehensive assessment (listed just below) when performed on new pt. evals) []  - 0 Comprehensive Assessment (HX, ROS, Risk Assessments, Wounds Hx, etc.) ASSESSMENTS -  Wound and Skin Assessment / Reassessment []  - 0 Dermatologic / Skin Assessment (not related to wound area) ASSESSMENTS - Ostomy and/or Continence Assessment and Care []  - 0 Incontinence Assessment and Management []  - 0 Ostomy Care Assessment and Management (repouching, etc.) PROCESS - Coordination of Care []  - 0 Simple Patient / Family Education for ongoing care []  - 0 Complex (extensive) Patient / Family Education for ongoing care []  - 0 Staff obtains Chiropractor, Records, T Results / Process Orders est []  - 0 Staff telephones HHA, Nursing Homes / Clarify orders / etc []  - 0 Routine Transfer to another Facility (non-emergent condition) []  - 0 Routine Hospital Admission (non-emergent condition) []  - 0 New Admissions / Insurance Authorizations / Ordering NPWT Apligraf, etcPARTH, KOLAR ([id number]) [id number] Page 2 of 7 []  - 0 Emergency Hospital Admission (emergent condition) PROCESS - Special Needs []  - 0 Pediatric / Minor Patient Management []  - 0 Isolation Patient Management []  - 0 Hearing / Language / Visual special needs []  - 0 Assessment of Community assistance (transportation, D/C planning, etc.) []  - 0 Additional assistance / Altered mentation []  - 0 Support Surface(s) Assessment (bed, cushion, seat, etc.) INTERVENTIONS - Miscellaneous []  - 0 External ear exam []  - 0 Patient Transfer (multiple staff / Nurse, adult / Similar devices) []  - 0 Simple Staple / Suture removal (25 or less) []  - 0 Complex Staple / Suture removal (26 or more) []  - 0 Hypo/Hyperglycemic Management (do not check if billed separately) []  - 0 Ankle / Brachial Index (ABI) - do not check if billed separately Has the patient been seen at the hospital within the last three years: Yes Total Score: 0 Level Of Care: ____ Electronic Signature(s) Unsigned Entered By: [name] on 02/18/2023  13:42:05 -------------------------------------------------------------------------------- Encounter Discharge Information Details Patient Name: Date of Service: [name] [name] [facility] J. 02/18/2023 1:15 PM Medical Record Number: [id number] Patient Account Number: [id number] Date of Birth/Sex: Treating RN: June 21, 1946 (77 y.o.  [name] Primary Care [name]: [name] Other Clinician: Referring [name]: Treating [name]/Extender: [name] in Treatment: 34 Encounter Discharge Information Items Post Procedure Vitals Discharge Condition: Stable Temperature (F): 98.5 Ambulatory Status: Walker Pulse (bpm): 68 Discharge Destination: Home Respiratory Rate (breaths/min): 16 Transportation: Private Auto Blood Pressure (mmHg): 118/79 Accompanied By: self Schedule Follow-up Appointment: Yes Clinical Summary of Care: Electronic Signature(s) Unsigned Entered By: [name] on 02/18/2023 13:44:05 -------------------------------------------------------------------------------- Lower Extremity Assessment Details Patient Name: Date of Service: [name] [name] [facility] J. 02/18/2023 1:15 PM Medical Record Number: [id number] Patient Account Number: [id number] Date of Birth/Sex: Treating RN: 1946/08/24 (77 y.o. [name] Primary Care [name]: [name] Other Clinician: Referring [name]: Treating [name]/Extender: [name] in Treatment: [address] ([id number]) [id number] Page 3 of 7 Edema Assessment Assessed: [Left: No] [Right: No] [Left: Edema] [Right: :] Calf Left: Right: Point of Measurement: 33 cm From Medial Instep 41 cm Ankle Left: Right: Point of Measurement: 11 cm From Medial Instep 23.3 cm Knee To Floor Left: Right: From Medial Instep 42 cm Vascular Assessment Pulses: Dorsalis Pedis Palpable: [Left:Yes] Electronic Signature(s) Unsigned Entered By: [name] on 02/18/2023  13:33:05 -------------------------------------------------------------------------------- Multi Wound Chart Details Patient Name: Date of Service: [name] [name] [facility] J. 02/18/2023 1:15 PM Medical Record Number: [id number] Patient Account Number: [id number] Date of Birth/Sex: Treating RN: April 28, 1946 (77 y.o. [name] Primary Care Lai Hendriks: [name] Other Clinician: Referring Arleigh Dicola: Treating Freman Lapage/Extender: Hermine Messick Weeks in Treatment: 34 Vital Signs Height(in): 70 Pulse(bpm): 68 Weight(lbs): 265 Blood Pressure(mmHg): 118/79 Body Mass Index(BMI): 38 Temperature(F): 98.5 Respiratory Rate(breaths/min): 18 [10:Photos:] [N/A:N/A] Left Calcaneus N/A N/A Wound Location: Gradually Appeared N/A N/A Wounding 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 N/A N/A Secondary Etiology: Cataracts, Arrhythmia, Coronary N/A N/A Comorbid History: Artery Disease, Hypertension, Type II Diabetes, Osteoarthritis, Neuropathy 06/05/2022 N/A N/A Date Acquired: 37 N/A N/A Weeks of Treatment: Open N/A N/A Wound Status: No N/A N/A Wound Recurrence: 1.2x1.5x0.1 N/A N/A Measurements L x W x D (cm) 1.414 N/A N/A A (cm) : [name], [name] ([id number]) [id number] Page 4 of 7 0.141 N/A N/A Volume (cm) : 0.00% N/A N/A % Reduction in A rea: 50.20% N/A N/A % Reduction in Volume: Grade 1 N/A N/A Classification: Medium N/A N/A Exudate A mount: Serosanguineous N/A N/A Exudate Type: red, brown N/A N/A Exudate Color: Flat and Intact N/A N/A Wound Margin: Small (1-33%) N/A N/A Granulation A mount: Pink N/A N/A Granulation Quality: Large (67-100%) N/A N/A Necrotic A mount: Fat Layer (Subcutaneous Tissue): Yes N/A N/A Exposed Structures: Fascia: No Tendon: No Muscle: No Joint: No Bone: No Small (1-33%) N/A N/A Epithelialization: Treatment Notes Electronic  Signature(s) Unsigned Entered By: [name] on 02/18/2023 13:36:18 -------------------------------------------------------------------------------- Multi-Disciplinary Care Plan Details Patient Name: Date of Service: [name] [facility] J. 02/18/2023 1:15 PM Medical Record Number: [id number] Patient Account Number: [id number] Date of Birth/Sex: Treating RN: 09-Aug-1946 (77 y.o. [name] Primary Care Nimai Burbach: [name] Other Clinician: Referring Kynleigh Artz: Treating Labron Bloodgood/Extender: Hermine Messick Weeks in Treatment: 19 Active Inactive Wound/Skin Impairment Nursing Diagnoses: Impaired tissue integrity Goals: Patient/caregiver will verbalize understanding of skin care regimen Date Initiated: 06/19/2022 Date Inactivated: 07/10/2022 Target Resolution Date: 06/19/2022 Goal Status: Met Ulcer/skin breakdown will have a volume reduction of 30% by week 4 Date Initiated: 06/19/2022 Date Inactivated: 07/28/2022 Target Resolution Date: 07/17/2022 Goal Status: Met Ulcer/skin breakdown will have a volume reduction of  50% by week 8 Date Initiated: 07/28/2022 Target Resolution Date: 03/20/2023 Goal Status: Active Interventions: Assess patient/caregiver ability to obtain necessary supplies Assess patient/caregiver ability to perform ulcer/skin care regimen upon admission and as needed Assess ulceration(s) every visit Provide education on ulcer and skin care Treatment Activities: Skin care regimen initiated : 06/19/2022 Topical wound management initiated : 06/19/2022 Notes: Electronic Signature(s) Unsigned Entered By: [name] on 02/18/2023 13:42:27 Signature(s): Barrie Folk ([id number]) [id number]_ Date(s): [id number].pdf Page 5 of 7 -------------------------------------------------------------------------------- Pain Assessment Details Patient Name: Date of Service: [name], [name] [name] 02/18/2023 1:15 PM Medical Record Number: [id number] Patient Account  Number: [id number] Date of Birth/Sex: Treating RN: 02/12/1946 (77 y.o. [name] Primary Care Amerie Beaumont: [name] Other Clinician: Referring Veola Cafaro: Treating Manessa Buley/Extender: [name] in Treatment: 34 Active Problems Location of Pain Severity and Description of Pain Patient Has Paino No Site Locations Pain Management and Medication Current Pain Management: Electronic Signature(s) Unsigned Entered By: [name] on 02/18/2023 13:25:15 -------------------------------------------------------------------------------- Patient/Caregiver Education Details Patient Name: Date of Service: [name], [name] [facility] 5/2/2024andnbsp1:15 PM Medical Record Number: [id number] Patient Account Number: [id number] Date of Birth/Gender: Treating RN: 1946/04/25 (77 y.o. [name] Primary Care Physician: [name] Other Clinician: Referring Physician: Treating Physician/Extender: [name] in Treatment: 72 Education Assessment Education Provided To: Patient Education Topics Provided Wound/Skin Impairment: Handouts: Caring for Your Ulcer Methods: Explain/Verbal Responses: State content correctly Electronic Signature(s) JOVONNE, ODORIZZI ([id number]) [id number] Page 6 of 7 Unsigned Entered By: [name] on 02/18/2023 13:42:41 -------------------------------------------------------------------------------- Wound Assessment Details Patient Name: Date of Service: [name], [name] [facility] J. 02/18/2023 1:15 PM Medical Record Number: [id number] Patient Account Number: [id number] Date of Birth/Sex: Treating RN: 10-31-1945 (77 y.o. [name] Primary Care Tysin Salada: [name] Other Clinician: Referring Lamisha Roussell: Treating Sathvik Tiedt/Extender: Hermine Messick Weeks in Treatment: 34 Wound Status Wound Number: 10 Primary Diabetic Wound/Ulcer of the Lower Extremity Etiology: Wound  Location: Left Calcaneus Secondary Pressure Ulcer Wounding Event: Gradually Appeared Etiology: Date Acquired: 06/05/2022 Wound Open Weeks Of Treatment: 34 Status: Clustered Wound: No Comorbid Cataracts, Arrhythmia, Coronary Artery Disease, Hypertension, History: Type II Diabetes, Osteoarthritis, Neuropathy Photos Wound Measurements Length: (cm) 1.2 Width: (cm) 1.5 Depth: (cm) 0.1 Area: (cm) 1.414 Volume: (cm) 0.141 % Reduction in Area: 0% % Reduction in Volume: 50.2% Epithelialization: Small (1-33%) Wound Description Classification: Grade 1 Wound Margin: Flat and Intact Exudate Amount: Medium Exudate Type: Serosanguineous Exudate Color: red, brown Foul Odor After Cleansing: No Slough/Fibrino Yes Wound Bed Granulation Amount: Small (1-33%) Exposed Structure Granulation Quality: Pink Fascia Exposed: No Necrotic Amount: Large (67-100%) Fat Layer (Subcutaneous Tissue) Exposed: Yes Necrotic Quality: Adherent Slough Tendon Exposed: No Muscle Exposed: No Joint Exposed: No Bone Exposed: No Treatment Notes Wound #10 (Calcaneus) Wound Laterality: Left Cleanser Peri-Wound Care Topical Gentamicin Discharge Instruction: Apply as directed by Muneer Leider. HERCHELL, [name] ([id number]) [id number] Page 7 of 7 Primary Dressing Hydrofera Blue Ready Transfer Foam, 2.5x2.5 (in/in) Discharge Instruction: Apply Hydrofera Blue Ready to wound bed as directed Secondary Dressing ABD Pad 5x9 (in/in) Discharge Instruction: Cover with ABD pad Secured With Kerlix Roll Sterile or Non-Sterile 6-ply 4.5x4 (yd/yd) Discharge Instruction: Apply Kerlix as directed Tubigrip Size D, 3x10 (in/yd) Compression Wrap Compression Stockings Add-Ons Electronic Signature(s) Unsigned Entered By: [name] on 02/18/2023 13:31:45 -------------------------------------------------------------------------------- Vitals Details Patient Name: Date of Service: [name], [name] [name]  J. 02/18/2023 1:15 PM Medical Record Number: [id number] Patient Account Number: [id number] Date of Birth/Sex: Treating RN:  01-11-1946 (77 y.o. [name] Primary Care Elyshia Kumagai: [name] Other Clinician: Referring Lallie Strahm: Treating Malasia Torain/Extender: [name] in Treatment: 34 Vital Signs Time Taken: 13:22 Temperature (F): 98.5 Height (in): 70 Pulse (bpm): 68 Weight (lbs): 265 Respiratory Rate (breaths/min): 18 Body Mass Index (BMI): 38 Blood Pressure (mmHg): 118/79 Reference Range: 80 - 120 mg / dl Electronic Signature(s) Unsigned Entered By: [name] on 02/18/2023 13:25:09 Signature(s): Date(s):

## 2023-02-19 NOTE — Progress Notes (Signed)
BOWAN, FOWLE ([id number]) [id number] Page 1 of 11 Visit Report for 02/18/2023 Chief Complaint Document Details Patient Name: Date of Service: [name], GEO [facility] J. 02/18/2023 1:15 PM Medical Record Number: [id number] Patient Account Number: [id number] Date of Birth/Sex: Treating RN: 1945/11/30 (76 y.o. [name] Primary Care Provider: [name] Other Clinician: Referring Provider: Treating Provider/Extender: Hermine Messick Weeks in Treatment: 34 Information Obtained from: Patient Chief Complaint Left heel ulcer Electronic Signature(s) Signed: 02/18/2023 4:17:31 PM By: [name] Derry PA-C Signed: 02/19/2023 1:06:24 PM By: Midge Aver MSN RN CNS WTA Previous Signature: 02/18/2023 1:19:37 PM Version By: [name] Derry PA-C Entered By: Midge Aver on 02/18/2023 13:43:10 -------------------------------------------------------------------------------- Debridement Details Patient Name: Date of Service: [name], [name] 02/18/2023 1:15 PM Medical Record Number: [id number] Patient Account Number: [id number] Date of Birth/Sex: Treating RN: Feb 22, 1946 (77 y.o. [name] Primary Care Provider: [name] Other Clinician: Referring Provider: Treating Provider/Extender: [name] in Treatment: 34 Debridement Performed for Assessment: Wound #10 Left Calcaneus Performed By: Physician [name] Derry, PA-C Debridement Type: Debridement Severity of Tissue Pre Debridement: Fat layer exposed Level of Consciousness (Pre-procedure): Awake and Alert Pre-procedure Verification/Time Out Yes - 13:38 Taken: Start Time: 13:38 Percent of Wound Bed Debrided: 100% T Area Debrided (cm): otal 1.41 Tissue and other material debrided: Viable, Non-Viable, Slough, Subcutaneous, Slough Level: Skin/Subcutaneous Tissue Debridement Description: Excisional Instrument: Curette Bleeding: Minimum Hemostasis Achieved: Pressure Procedural  Pain: 0 Post Procedural Pain: 0 Response to Treatment: Procedure was tolerated well Level of Consciousness (Post- Awake and Alert procedure): Post Debridement Measurements of Total Wound Length: (cm) 1.2 Width: (cm) 1.5 Depth: (cm) 0.2 Volume: (cm) 0.283 Character of Wound/Ulcer Post Debridement: Stable Severity of Tissue Post Debridement: Fat layer exposed Post Procedure Diagnosis Same as Pre-procedure Electronic Signature(s) Signed: 02/18/2023 4:17:31 PM By: [name] Derry PA-C Signed: 02/19/2023 1:06:24 PM By: Midge Aver MSN RN CNS Cecille Amsterdam ([id number]) [id number] Page 2 of 11 Entered By: Midge Aver on 02/18/2023 13:39:47 -------------------------------------------------------------------------------- HPI Details Patient Name: Date of Service: [name], GEO [facility] J. 02/18/2023 1:15 PM Medical Record Number: [id number] Patient Account Number: [id number] Date of Birth/Sex: Treating RN: [name] 03, 1946 (77 y.o. [name] Primary Care Provider: [name] Other Clinician: Referring Provider: Treating Provider/Extender: Hermine Messick Weeks in Treatment: 34 History of Present Illness HPI Description: 09/24/2020 on evaluation today patient presents today for a heel ulcer that he tells me has been present for about 2 years. He has been seeing podiatry and they have been attempting to manage this including what sounds to be a total contact cast, Unna boot, and just standard dressings otherwise as well. Most recently has been using triple antibiotic ointment. With that being said unfortunately despite everything he really has not had any significant improvement. He tells me that he cannot even really remember exactly how this began but he presumed it may have rubbed on his shoes or something of that nature. 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 He tells me that this does seem to help. Fortunately there is no signs of active infection at this time. His most recent hemoglobin A1c was 8.1 though he knows this was this year he  cannot tell me the exact time. His fluid pills currently to help with some of the lower extremity edema although he does obviously have signs of venous stasis/lymphedema. Currently there is no evidence of active infection. No fevers, chills, nausea, vomiting, or diarrhea. Patient has had fairly recent ABIs which were performed on 07/19/2020 and revealed that he has normal findings in both the ankle and toe locations bilaterally. His ABI on the right was 1.09 on the left was 1.08 with a TBI on the right of 0.88 and on the left of 0.94. Triphasic flow was noted throughout. 10/08/2020 on evaluation today patient appears to be doing pretty well in regard to his left heel currently in fact this is doing a great job and seems to be healing quite nicely. Unfortunately on his right leg he had a pile of wood that actually fell on him injuring his right leg this is somewhat erythematous has me concerned little bit about cellulitis though there is not really a good area to culture at this point. 10/24/2020 upon evaluation today patient appears to be doing well with regard to his heel ulcer. He is showing signs of improvement which is great news. His right leg is completely healed. Overall I feel like he is doing excellent and there is no signs of infection. 11/07/2020 upon evaluation today patient appears to be doing well with regard to his heel ulcer. He tells me that last week when he was unable to come in his wife actually thought that the wound was very close to closing if not closed. Then it began to "reopen again". I really feel like  what may have happened as the collagen may have dried over the wound bed and that because that misunderstanding with thinking that the wound was healing. With that being said I did not see it last week I do not know that for certain. Either way I feel like he is doing great today I see no signs of infection at this point. 11/26/2020 upon inspection today patient appears to be doing decently well in regard to his heel ulcer. He has been tolerating the dressing changes without complication. Fortunately there is no sign of active infection at this time. No fevers, chills, nausea, vomiting, or diarrhea. 12/10/2020 upon evaluation today patient appears to be doing fairly well in regard to the wound on his heel as well as what appears to be a new wound of the left first metatarsal head plantar aspect. This seems to be an area that was callus that has split as the patient tells me has been trying to walk on his toes more has probably where this came from. With that being said there does not appear to be signs of active infection which is great news. 12/17/2020 upon evaluation today patient actually appears to be making good progress currently. Fortunately there is no evidence of active infection at this time. Overall I feel like he is very close to complete closure. 12/26/2020 upon evaluation today patient appears to be doing excellent in regard to his wounds. In fact I am not certain that these are not even completely healed on initial inspection. Overall I am very pleased with where things stand today. Good news is that they are healed he is actually get ready to go out of town and that will be helpful as well as he will be a full part of the time. Readmission: 02/04/2021 upon evaluation today patient appears to be doing well at this point in regard to  his left heel that I previously saw him for. Unfortunately he is having issues with his right lower extremity. He has significant wounds at this point he also has  erythema noted there is definite signs of cellulitis which is unfortunate. With that being said I think we do need to address this sooner rather than later. The good news is he did have a nice trip to the beach. He tells me that he had no issues during that time. 4/27; patient on Bactrim. Culture showed methicillin sensitive staph aureus therefore the Bactrim should be effective. 2 small areas on the right leg are healed the area on the mid aspect of the tibia almost 100% covered in a very adherent necrotic debris. We have been using silver alginate 02/20/2021 upon evaluation today patient appears to be doing well with regard to his wound. He is showing signs of improvement which is great news overall very pleased with where things stand today. No fevers, chills, nausea, vomiting, or diarrhea. 02/27/2021 upon evaluation today patient appears to be doing well with regard to his leg ulcer. He is tolerating the dressing changes and overall appears to be doing quite excellent. I am extremely pleased with where things stand and overall I think patient is making great progress. There is no sign of active infection at this time which is also great news. 03/06/2021 upon evaluation today patient appears to be doing excellent in regard to his wounds. In fact he appears to be completely healed today based on what I am seeing. This is excellent news and overall I am extremely pleased with where he stands. Overall the patient is happy to hear this as well this has been quite sometime coming. Readmission: 04/01/2021 patient unfortunately returns for readmission today. He tells me that he has been wearing his compression socks on the right leg daily and he does not really know what is going on and why his legs are doing what they are doing. We did therefore go ahead and probe deeper into exactly what has been going on with him today. Subsequently the patient tells me that when he gets up and what we would call "first  thing in the morning" are really 2 different things". He does not tend to sleep well so he tells me that he will often wake up at 3:00 in the morning. He will then potentially going into the living room to get in his chair where he may read a book for a little while and then potentially fall asleep back in his chair. He then subsequently wake up around 5:00 or so and then get up and in his words "putter around". This often will end with him proceeding at some point around 8 AM or 9 AM to putting on his compression socks. With that being said this means that anywhere from the 3:00 in the morning till roughly around 9:00 in the morning he has no compression on yet he is sitting in his recliner, walking around and up and about, and this is at least 5 to 6 hours of noncompressed time. That may be our issue here but I did not realize until we discussed this further today. Fortunately there does not appear to be any signs of active infection at this time which is great news. With that being said he has CHADERICK, NABA ([id number]) [id number] Page 3 of 11 multiple wounds of the bilateral lower extremities. 04/10/2021 upon evaluation today patient appears to be doing well with regard to his legs  for the most part. He does look like he had some injury where his wrap slid down nonetheless he did some "doctoring on them". I do feel like most of the areas honestly have cleared back up which is great news. There does not appear to be any signs of active infection at this time which is also great news. No fevers, chills, nausea, vomiting, or diarrhea. 04/18/2021 upon evaluation today patient appears to be doing well with regard to his wounds in fact he appears to be completely healed which is great news. Fortunately there does not appear to be any signs of active infection which is great news. No fevers, chills, nausea, vomiting, or diarrhea. READMISSION 07/28/2021 This is a now  77 year old man we have had in this clinic for quite a bit of this year. 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 He wears compression stockings. 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 He comes in an old running shoes although he says he wears better shoes at home. His history is that he noticed a blister in the right posterior heel. This open. He has been applying Neosporin to it currently the wound measures 2 x 1.4 cm. 100% slough covered. Not really offloading this in any rigorous way. Past medical history is essentially unchanged he has paroxysmal atrial fibrillation on Coumadin type 2 diabetes with a recent hemoglobin A1c of 7 on metformin. ABI in our clinic was 0.98 on the right 08/05/2021 upon evaluation today patient appears to be doing well with regard to his heel ulcer. Fortunately there does not appear to be any signs of active infection at this time. Overall I been very pleased with where things seem to be currently as far as the wound healing is concerned. Again this is first a lot of seeing him Dr. Leanord Hawking readmitted him last week. Nonetheless I think we are definitely making some progress here. 08/11/2021 upon evaluation today patient's wound on the heel actually showing signs of excellent improvement. I am actually very pleased with where things stand currently. No fevers, chills, nausea, vomiting, or diarrhea. There does not appear to be any need for sharp debridement today either which is also great news. 08/25/2021 upon evaluation today patient appears to be doing well with regard to his heel ulcer. This is actually looking significantly improved compared to last time I saw him. Fortunately there does not appear to be any evidence of active infection at this time. No fevers, chills, nausea, vomiting, or diarrhea. 09/01/2021 upon evaluation today patient appears to be  doing decently well in regard to his wounds. Fortunately there does not appear to be any signs of active infection at this time which is great news and overall very pleased in that regard. I do not see any evidence of active infection systemically which is great news as well. No fevers, chills, nausea, vomiting, or diarrhea. I think that the heel is making excellent progress. 09/15/2021 upon evaluation today patient's heel unfortunately is significantly worse compared to what it was previous. I do believe that at this time he would benefit from switching back to something a little bit more offloading from the cushion shoe he has right now this is more of a heel protector what I really need is a Prevalon offloading boot which I think is good to do a lot better for him than just the small heel protector. 09/23/2021 upon evaluation today patient appears  to be doing a little better in regards to last week's visit. Overall I think that he is making good progress which is great news and there does not appear to be any evidence of active infection at this time. No fevers, chills, nausea, vomiting, or diarrhea. 09/30/2021 upon evaluation patient's heel is actually showing signs of good improvement which is great news. Fortunately there does not appear to be any evidence of active infection locally nor systemically at this point. No fevers, chills, nausea, vomiting, or diarrhea. 10/07/2021 upon evaluation today patient appears to be doing well currently in regard to his heel ulcer. He has been tolerating the dressing changes without complication. Fortunately I do not see any signs of active infection at this time which is great news. No fevers, chills, nausea, vomiting, or diarrhea. 12/27; wound is measuring smaller. We have been using silver alginate 10/28/2021 upon evaluation today patient appears to be doing well currently in regard to his heel ulcer. I am actually very pleased with where things stand and  I think he is making excellent progress. Fortunately I do not see any signs of active infection locally nor systemically at this point which is great news. Nonetheless I do believe that the patient would benefit from a new offloading shoe and has been using it Velcro is no longer functioning properly and he tells me that he almost tripped and fell because of that today. Obviously I do not want him following that would be very bad. 11/11/2021 upon evaluation today patient appears to be doing excellent in regard to his wound. In fact this appears to be completely healed based on what I see currently. I do not see any signs of anything open or draining and I did double check just by clearing away some of the callus around the edges of the wound to ensure that there was nothing still open or hiding underneath. Once this was cleared away it appeared that the patient was doing significantly better at this time which is great news and there was nothing actually open. Readmission: 06-19-2022 upon evaluation today patient appears to be doing well currently in regard to his heel ulcer all things considered. He unfortunately is having a bit of breakdown in general as far as the heel is concerned not nearly as bad as what we noted last time he was here in the clinic. The good news is I do think that this should hopefully heal much more rapidly than what we noted last time. His past medical history has not changed he is on Coumadin. 07-03-2022 upon evaluation today patient appears to be doing better in regard to his heel. We will start to see some improvement here which is good news. Fortunately I do not see any evidence of infection locally or systemically at this time which is great news. No fevers, chills, nausea, vomiting, or diarrhea. 07-10-2022 upon evaluation today patient appears to be doing well currently in regard to his wound from the callus standpoint around the edges. Unfortunately from an actual wound bed  standpoint he has some deep tissue injury noted at this point. Again I am not sure exactly what is going on that is causing this pressure to the region but he is definitely gotten pressure that is occurring and causing this to not heal as effectively as what we would like to see. I discussed with him today that he may need to at night when he sleeping use a pillow up underneath his calf in order to prevent  the heel from touching the bed at all. I also think that it may be beneficial for him to monitor throughout his day and make sure there is no other time when he is getting the pressure to the heel. 07-23-2022 upon evaluation today patient appears to be doing poorly currently in regard to his heel ulcer. He has been tolerating the dressing changes without complication. Unfortunately I feel like he may be infected based on what I am seeing. 07-28-2022 I did review patient's culture results which showed positive for Proteus as well as Staphylococcus both of which would be treated with the Bactrim DS that I gave him at the last visit. This is good news and hopefully means that we should be able to apply the cast today as long as the wound itself does not appear to be doing poorly. Patient's wound bed actually showed signs of doing quite well and I am very pleased with where things stand and I do not see any signs of worsening overall which is great news. 08-04-2022 upon evaluation today patient appears to be doing well currently in regard to his heel ulcer. I am actually very pleased with where things stand and I do think this is looking significantly better. With regard to his right shin that is also showing signs of improvement which is great news. 08-10-2022 upon evaluation today patient's wound on the heel actually appears to be doing significantly better. In regard to the right anterior shin this is showing signs of healing it might even be completely healed but I still get a monitor I cannot get  anything to fill away but also could not see where there was anything actually draining at this point. I am tending to think healed but I want a monitor 1 more week before I call it for sure. JAIS, RUBI ([id number]) [id number] Page 4 of 11 08-17-2022 upon evaluation today patient appears to be doing well currently in regard to his heel. Fortunately he is tolerating the dressing changes without complication. I do not see any signs of infection and overall I think he is making good progress here. We did get approval for the Apligraf but I think he had a $295 co-pay that would have to be paid per application. With that being said as good as he is doing right now I do not think that is necessary but is still an option depending on how things progress if we need to speed this up quite a bit. 08/24/2022; this patient has a wound on his left heel in the setting of type 2 diabetes. We have been using Prisma. He has a heel offloading boot 08-31-2022 upon evaluation today patient appears to be doing poorly in regard to his heel ulcer which is still showing signs of bruising I am just not as happy as I was when I saw him 2 weeks ago with this. He saw Dr. Leanord Hawking last week. Also did not look good at that point apparently. Nonetheless I think that the patient is still continuing to have some counterpressure getting to the wound bed unfortunately. 09-07-2022 upon evaluation today patient appears to be doing well currently in regard to his heel ulcer. He has been tolerating the dressing changes without complication. Fortunately there does not appear to be any signs of active infection locally or systemically at this time. Fortunately I do not see any evidence of active infection at this time. 09-15-2022 upon evaluation today patient appears to be doing well currently in regard to  his legs which in fact appear to be completely healed this is great news. With that being said his heel  does have some erythema and warmth as well as drainage I am concerned about cellulitis at this location. 09-21-2022 upon evaluation today patient's wound is actually showing signs of being a little bit smaller but still we are not doing nearly as well as what I would like to see. Fortunately I do not see any signs of infection at this time which is great news and overall I am extremely pleased in that regard. With that being said I do think that he needs to continue to elevate his leg although the edema is under great control with a compression wrap I think switching to a different dressing topically may be beneficial. My suggestion is to switch him over to a Iodoflex dressing. 09-28-2022 upon evaluation today patient appears to be doing well with regard to his heel ulcer. This is actually showing signs of improvement which is great news and overall I am extremely pleased with where we stand today. 10-06-2022 upon evaluation today patient actually appears to be making excellent progress at this point. Fortunately there does not appear to be any signs of active infection locally nor systemically which is great news and overall I am extremely pleased with where we stand. I do feel like he is actually making some pretty good progress here as we switch to the wrap along with the Iodoflex. 12/26; patient has a wound on the tip of his left heel. This is not a weightbearing surface. He is using a heel offloading boot and carefully offloading this at other times during the day. He is using Iodoflex and Zetuvitunder 3 layer compression 10-22-2022 upon evaluation today patient appears to be doing well currently in regard to his wound. He has been tolerating the dressing changes without complication. Fortunately there does not appear to be any signs of active infection locally nor systemically at this time. No fevers, chills, nausea, vomiting, or diarrhea. 08-30-2023 upon evaluation today patient appears to be  doing well currently in regard to his heel ulcer which is showing signs of good improvement. Fortunately I do not see any evidence of infection locally nor systemically which is great news and overall I am extremely pleased with where we stand. No fevers, chills, nausea, vomiting, or diarrhea. Unfortunately he does have a wound on the side of his fifth toe where I think this did rub on the shoe. 11-05-2022 upon evaluation today patient still still showing some signs of pressure at this point and there may have even been some fluid collection at this time. Fortunately I do not see any evidence of active infection locally nor systemically which is great news and overall I am extremely pleased with where we stand. No fevers, chills, nausea, vomiting, or diarrhea. 1/25; the patient's area on the dorsal aspect of the left fifth toe is healed. We have been using Iodoflex to the area on the left heel. The lateral wound is slightly down in length. 11-19-2022 upon evaluation today patient appears to be doing well currently in regard to his wound. He has been tolerating the dressing changes without complication. Fortunately there does not appear to be any signs of infection we been using Iodoflex up to this point. 11-26-2022 upon evaluation today patient appears to be doing poorly currently in regard to his wound. He has been tolerating the dressing changes with the Iodoflex without complication. With that being said he does not have  any signs of active infection systemically though locally I do feel like there is some evidence of infection here. 12-03-2022 upon evaluation today patient appears to be doing well currently in regard to his wound this is better with the Bactrim compared to last week still there was some significant slough and biofilm buildup which is going require sharp debridement at this point. Fortunately I do not see any signs of active infection locally nor systemically which is great  news. 12-11-2022 upon evaluation today patient actually appears to be doing significantly better at this time in regard to his heel. I am actually very pleased with where things stand currently. There is no signs of active infection locally or systemically at this point. 12-17-2022 upon evaluation today patient appears to be doing better as far as the overall appearance of his wound. I am actually very pleased in that regard. Fortunately there does not appear to be any signs of active infection locally nor systemically at this time. I do feel like that the patient is showing signs of excellent improvement in general. Nonetheless he needs something better for compression I think he should go ahead and see about getting compression socks. 3/7; tip of the left heel measurements are smaller he has been using Hydrofera Blue 01-07-2023 upon evaluation today patient appears to be doing well currently in regard to his wound. This has not been debrided since I last saw him and it definitely shows that definitely needs some debridement today. Fortunately I do not see any evidence of active infection locally nor systemically which is great news. 01-14-2023 upon evaluation today patient appears to be doing well currently although he does have some erythema and warmth to the heel I feel like he may have developed an infection yet again. This is something that is been ongoing issue we have been trying to manage his foot best we could. With that being said I do think he may need to go back on the Bactrim this has done well for him in the past. 01-21-2023 upon evaluation today patient appears to be doing better in regard to his heel. He is doing very well with the antibiotics and that is made a big difference for him based on what I am seeing I am very pleased with where we stand today. He is obviously doing extremely well which is great news. 01-28-2023 upon evaluation today patient appears to be doing well currently in  regard to his wound. This is actually showing signs of excellent improvement I am actually very pleased with where we stand and I do believe that he is making good progress here. Fortunately I do not see any evidence of active infection locally nor systemically which is great news. No fevers, chills, nausea, vomiting, or diarrhea. 02-04-2023 upon evaluation today patient's wound is actually showed signs of excellent improvement I am actually very pleased with where we stand. I do not see any signs of active infection locally nor systemically which is great news and in general I do believe that we are moving in the right direction here. No fevers, chills, nausea, vomiting, or diarrhea. 02-11-2023 upon evaluation patient's wound actually showing signs of excellent improvement I am actually very pleased with where we stand I think that his pain is better there is no signs of infection we will continue to use the gentamicin and overall we are making excellent progress. CHIJIOKE, LUNGREN ([id number]) [id number] Page 5 of 11 02-18-2023 upon evaluation today patient appears to be doing well  currently in regard to his wound. He has been tolerating the dressing changes without complication. Fortunately I do not see any signs of active infection locally nor systemically which is great news. Electronic Signature(s) Signed: 02/18/2023 1:44:04 PM By: [name] Derry PA-C Entered By: [name] Derry on 02/18/2023 13:44:04 -------------------------------------------------------------------------------- Physical Exam Details Patient Name: Date of Service: [name], [name] 02/18/2023 1:15 PM Medical Record Number: [id number] Patient Account Number: [id number] Date of Birth/Sex: Treating RN: Jan 31, 1946 (77 y.o. [name] Primary Care Provider: [name] Other Clinician: Referring Provider: Treating Provider/Extender: Hermine Messick Weeks in Treatment:  9 Constitutional Well-nourished and well-hydrated in no acute distress. Respiratory normal breathing without difficulty. Psychiatric this patient is able to make decisions and demonstrates good insight into disease process. Alert and Oriented x 3. pleasant and cooperative. Notes Upon inspection patient's wound bed did require sharp debridement this is measuring smaller and looking much better but again I did have to perform some debridement clearway some of the slough and biofilm on the surface of the wound down to good subcutaneous tissue today without complication and postdebridement this appears to be much improved. he tolerated that Electronic Signature(s) Signed: 02/18/2023 1:44:20 PM By: [name] Derry PA-C Entered By: [name] Derry on 02/18/2023 13:44:20 -------------------------------------------------------------------------------- Physician Orders Details Patient Name: Date of Service: [name], [name] 02/18/2023 1:15 PM Medical Record Number: [id number] Patient Account Number: [id number] Date of Birth/Sex: Treating RN: 02-09-1946 (77 y.o. 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 Bathing/ Shower/ Hygiene May shower; gently cleanse wound with antibacterial soap, rinse and pat dry prior to dressing wounds Non-Wound Condition dditional non-wound orders/instructions: - Apply 40% Urea cream to dry cracked skin on feet. A Off-Loading Open toe surgical shoe with peg assist. Wound Treatment SURAJ, JOURNIGAN  ([id number]) [id number] Page 6 of 11 Wound #10 - Calcaneus Wound Laterality: Left Topical: Gentamicin 3 x Per Week/30 Days Discharge Instructions: Apply as directed by provider. Prim Dressing: Hydrofera Blue Ready Transfer Foam, 2.5x2.5 (in/in) (Dispense As Written) 3 x Per Week/30 Days ary Discharge Instructions: Apply Hydrofera Blue Ready to wound bed as directed Secondary Dressing: ABD Pad 5x9 (in/in) (Generic) 3 x Per Week/30 Days Discharge Instructions: Cover with ABD pad Secured With: Kerlix Roll Sterile or Non-Sterile 6-ply 4.5x4 (yd/yd) 3 x Per Week/30 Days Discharge Instructions: Apply Kerlix as directed Secured With: Tubigrip Size D, 3x10 (in/yd) 3 x Per Week/30 Days Electronic Signature(s) Signed: 02/18/2023 4:17:31 PM By: [name] Derry PA-C Signed: 02/19/2023 1:06:24 PM By: Midge Aver MSN RN CNS WTA Entered By: Midge Aver on 02/18/2023 13:41:57 -------------------------------------------------------------------------------- Problem List Details Patient Name: Date of Service: [name], [name] 02/18/2023 1:15 PM Medical Record Number: [id number] Patient Account Number: [id number] Date of Birth/Sex: Treating RN: 04-22-1946 (77 y.o. [name] Primary Care Provider: [name] Other Clinician: Referring Provider: Treating Provider/Extender: Hermine Messick Weeks in Treatment: 56 Active Problems ICD-10 Encounter Code Description Active Date MDM Diagnosis E11.621 Type 2 diabetes mellitus with foot ulcer 06/19/2022 No Yes 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sed 06/19/2022  No Yes E11.42 Type 2 diabetes mellitus with diabetic polyneuropathy 06/19/2022 No Yes Inactive Problems Resolved Problems Electronic Signature(s) Signed: 02/18/2023 4:17:31 PM By: [name] Derry PA-C Signed: 02/19/2023 1:06:24 PM By: Midge Aver MSN RN CNS WTA Previous Signature: 02/18/2023 1:19:31 PM Version By: [name] Derry  PA-C Entered By: Midge Aver on 02/18/2023 13:42:54 -------------------------------------------------------------------------------- Progress Note Details Patient Name: Date of Service: [name], [name] 02/18/2023 1:15 PM Medical Record Number: [id number] Patient Account Number: [id number] Date of Birth/Sex: Treating RN: 1945/11/01 (77 y.o. [name] Primary Care Provider: [name] Other Clinician: Referring Provider: Treating Provider/Extender: [name] in Treatment: [address] ([id number]) [id number] Page 7 of 11 Subjective Chief Complaint Information obtained from Patient Left heel ulcer History of Present Illness (HPI) 09/24/2020 on evaluation today patient presents today for a heel ulcer that he tells me has been present for about 2 years. He has been seeing podiatry and they have been attempting to manage this including what sounds to be a total contact cast, Unna boot, and just standard dressings otherwise as well. Most recently has been using triple antibiotic ointment. With that being said unfortunately despite everything he really has not had any significant improvement. He tells me that he cannot even really remember exactly how this began but he presumed it may have rubbed on his shoes or something of that nature. 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 He tells me that this does seem to help. Fortunately there is no signs of active infection at this time. His most recent hemoglobin A1c was 8.1 though he knows this was this year he cannot tell me the exact time. His fluid pills currently to help with some of the lower  extremity edema although he does obviously have signs of venous stasis/lymphedema. Currently there is no evidence of active infection. No fevers, chills, nausea, vomiting, or diarrhea. Patient has had fairly recent ABIs which were performed on 07/19/2020 and revealed that he has normal findings in both the ankle and toe locations bilaterally. His ABI on the right was 1.09 on the left was 1.08 with a TBI on the right of 0.88 and on the left of 0.94. Triphasic flow was noted throughout. 10/08/2020 on evaluation today patient appears to be doing pretty well in regard to his left heel currently in fact this is doing a great job and seems to be healing quite nicely. Unfortunately on his right leg he had a pile of wood that actually fell on him injuring his right leg this is somewhat erythematous has me concerned little bit about cellulitis though there is not really a good area to culture at this point. 10/24/2020 upon evaluation today patient appears to be doing well with regard to his heel ulcer. He is showing signs of improvement which is great news. His right leg is completely healed. Overall I feel like he is doing excellent and there is no signs of infection. 11/07/2020 upon evaluation today patient appears to be doing well with regard to his heel ulcer. He tells me that last week when he was unable to come in his wife actually thought that the wound was very close to closing if not closed. Then  it began to "reopen again". I really feel like what may have happened as the collagen may have dried over the wound bed and that because that misunderstanding with thinking that the wound was healing. With that being said I did not see it last week I do not know that for certain. Either way I feel like he is doing great today I see no signs of infection at this point. 11/26/2020 upon inspection today patient appears to be doing decently well in regard to his heel ulcer. He has been tolerating the dressing changes  without complication. Fortunately there is no sign of active infection at this time. No fevers, chills, nausea, vomiting, or diarrhea. 12/10/2020 upon evaluation today patient appears to be doing fairly well in regard to the wound on his heel as well as what appears to be a new wound of the left first metatarsal head plantar aspect. This seems to be an area that was callus that has split as the patient tells me has been trying to walk on his toes more has probably where this came from. With that being said there does not appear to be signs of active infection which is great news. 12/17/2020 upon evaluation today patient actually appears to be making good progress currently. Fortunately there is no evidence of active infection at this time. Overall I feel like he is very close to complete closure. 12/26/2020 upon evaluation today patient appears to be doing excellent in regard to his wounds. In fact I am not certain that these are not even completely healed on initial inspection. Overall I am very pleased with where things stand today. Good news is that they are healed he is actually get ready to go out of town and that will be helpful as well as he will be a full part of the time. Readmission: 02/04/2021 upon evaluation today patient appears to be doing well at this point in regard to his left heel that I previously saw him for. Unfortunately he is having issues with his right lower extremity. He has significant wounds at this point he also has erythema noted there is definite signs of cellulitis which is unfortunate. With that being said I think we do need to address this sooner rather than later. The good news is he did have a nice trip to the beach. He tells me that he had no issues during that time. 4/27; patient on Bactrim. Culture showed methicillin sensitive staph aureus therefore the Bactrim should be effective. 2 small areas on the right leg are healed the area on the mid aspect of the tibia  almost 100% covered in a very adherent necrotic debris. We have been using silver alginate 02/20/2021 upon evaluation today patient appears to be doing well with regard to his wound. He is showing signs of improvement which is great news overall very pleased with where things stand today. No fevers, chills, nausea, vomiting, or diarrhea. 02/27/2021 upon evaluation today patient appears to be doing well with regard to his leg ulcer. He is tolerating the dressing changes and overall appears to be doing quite excellent. I am extremely pleased with where things stand and overall I think patient is making great progress. There is no sign of active infection at this time which is also great news. 03/06/2021 upon evaluation today patient appears to be doing excellent in regard to his wounds. In fact he appears to be completely healed today based on what I am seeing. This is excellent news and overall I  am extremely pleased with where he stands. Overall the patient is happy to hear this as well this has been quite sometime coming. Readmission: 04/01/2021 patient unfortunately returns for readmission today. He tells me that he has been wearing his compression socks on the right leg daily and he does not really know what is going on and why his legs are doing what they are doing. We did therefore go ahead and probe deeper into exactly what has been going on with him today. Subsequently the patient tells me that when he gets up and what we would call "first thing in the morning" are really 2 different things". He does not tend to sleep well so he tells me that he will often wake up at 3:00 in the morning. He will then potentially going into the living room to get in his chair where he may read a book for a little while and then potentially fall asleep back in his chair. He then subsequently wake up around 5:00 or so and then get up and in his words "putter around". This often will end with him proceeding at some  point around 8 AM or 9 AM to putting on his compression socks. With that being said this means that anywhere from the 3:00 in the morning till roughly around 9:00 in the morning he has no compression on yet he is sitting in his recliner, walking around and up and about, and this is at least 5 to 6 hours of noncompressed time. That may be our issue here but I did not realize until we discussed this further today. Fortunately there does not appear to be any signs of active infection at this time which is great news. With that being said he has multiple wounds of the bilateral lower extremities. 04/10/2021 upon evaluation today patient appears to be doing well with regard to his legs for the most part. He does look like he had some injury where his wrap slid down nonetheless he did some "doctoring on them". I do feel like most of the areas honestly have cleared back up which is great news. There does not appear to be any signs of active infection at this time which is also great news. No fevers, chills, nausea, vomiting, or diarrhea. 04/18/2021 upon evaluation today patient appears to be doing well with regard to his wounds in fact he appears to be completely healed which is great news. Fortunately there does not appear to be any signs of active infection which is great news. No fevers, chills, nausea, vomiting, or diarrhea. READMISSION 07/28/2021 This is a now 77 year old man we have had in this clinic for quite a bit of this year. He has been in here with bilateral lower extremity leg wounds probably GOTHAM, LENNING ([id number]) [id number] Page 8 of 11 secondary to chronic venous insufficiency. He wears compression stockings. 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 He comes in an old running shoes although he says he wears better shoes at home. His history is that he noticed a blister in the right posterior heel. This open. He has  been applying Neosporin to it currently the wound measures 2 x 1.4 cm. 100% slough covered. Not really offloading this in any rigorous way. Past medical history is essentially unchanged he has paroxysmal atrial fibrillation on Coumadin type 2 diabetes with a recent hemoglobin A1c of 7 on metformin. ABI in our clinic was 0.98 on the right 08/05/2021 upon evaluation today patient  appears to be doing well with regard to his heel ulcer. Fortunately there does not appear to be any signs of active infection at this time. Overall I been very pleased with where things seem to be currently as far as the wound healing is concerned. Again this is first a lot of seeing him Dr. Leanord Hawking readmitted him last week. Nonetheless I think we are definitely making some progress here. 08/11/2021 upon evaluation today patient's wound on the heel actually showing signs of excellent improvement. I am actually very pleased with where things stand currently. No fevers, chills, nausea, vomiting, or diarrhea. There does not appear to be any need for sharp debridement today either which is also great news. 08/25/2021 upon evaluation today patient appears to be doing well with regard to his heel ulcer. This is actually looking significantly improved compared to last time I saw him. Fortunately there does not appear to be any evidence of active infection at this time. No fevers, chills, nausea, vomiting, or diarrhea. 09/01/2021 upon evaluation today patient appears to be doing decently well in regard to his wounds. Fortunately there does not appear to be any signs of active infection at this time which is great news and overall very pleased in that regard. I do not see any evidence of active infection systemically which is great news as well. No fevers, chills, nausea, vomiting, or diarrhea. I think that the heel is making excellent progress. 09/15/2021 upon evaluation today patient's heel unfortunately is significantly worse compared  to what it was previous. I do believe that at this time he would benefit from switching back to something a little bit more offloading from the cushion shoe he has right now this is more of a heel protector what I really need is a Prevalon offloading boot which I think is good to do a lot better for him than just the small heel protector. 09/23/2021 upon evaluation today patient appears to be doing a little better in regards to last week's visit. Overall I think that he is making good progress which is great news and there does not appear to be any evidence of active infection at this time. No fevers, chills, nausea, vomiting, or diarrhea. 09/30/2021 upon evaluation patient's heel is actually showing signs of good improvement which is great news. Fortunately there does not appear to be any evidence of active infection locally nor systemically at this point. No fevers, chills, nausea, vomiting, or diarrhea. 10/07/2021 upon evaluation today patient appears to be doing well currently in regard to his heel ulcer. He has been tolerating the dressing changes without complication. Fortunately I do not see any signs of active infection at this time which is great news. No fevers, chills, nausea, vomiting, or diarrhea. 12/27; wound is measuring smaller. We have been using silver alginate 10/28/2021 upon evaluation today patient appears to be doing well currently in regard to his heel ulcer. I am actually very pleased with where things stand and I think he is making excellent progress. Fortunately I do not see any signs of active infection locally nor systemically at this point which is great news. Nonetheless I do believe that the patient would benefit from a new offloading shoe and has been using it Velcro is no longer functioning properly and he tells me that he almost tripped and fell because of that today. Obviously I do not want him following that would be very bad. 11/11/2021 upon evaluation today patient  appears to be doing excellent in regard to his  wound. In fact this appears to be completely healed based on what I see currently. I do not see any signs of anything open or draining and I did double check just by clearing away some of the callus around the edges of the wound to ensure that there was nothing still open or hiding underneath. Once this was cleared away it appeared that the patient was doing significantly better at this time which is great news and there was nothing actually open. Readmission: 06-19-2022 upon evaluation today patient appears to be doing well currently in regard to his heel ulcer all things considered. He unfortunately is having a bit of breakdown in general as far as the heel is concerned not nearly as bad as what we noted last time he was here in the clinic. The good news is I do think that this should hopefully heal much more rapidly than what we noted last time. His past medical history has not changed he is on Coumadin. 07-03-2022 upon evaluation today patient appears to be doing better in regard to his heel. We will start to see some improvement here which is good news. Fortunately I do not see any evidence of infection locally or systemically at this time which is great news. No fevers, chills, nausea, vomiting, or diarrhea. 07-10-2022 upon evaluation today patient appears to be doing well currently in regard to his wound from the callus standpoint around the edges. Unfortunately from an actual wound bed standpoint he has some deep tissue injury noted at this point. Again I am not sure exactly what is going on that is causing this pressure to the region but he is definitely gotten pressure that is occurring and causing this to not heal as effectively as what we would like to see. I discussed with him today that he may need to at night when he sleeping use a pillow up underneath his calf in order to prevent the heel from touching the bed at all. I also think that it may  be beneficial for him to monitor throughout his day and make sure there is no other time when he is getting the pressure to the heel. 07-23-2022 upon evaluation today patient appears to be doing poorly currently in regard to his heel ulcer. He has been tolerating the dressing changes without complication. Unfortunately I feel like he may be infected based on what I am seeing. 07-28-2022 I did review patient's culture results which showed positive for Proteus as well as Staphylococcus both of which would be treated with the Bactrim DS that I gave him at the last visit. This is good news and hopefully means that we should be able to apply the cast today as long as the wound itself does not appear to be doing poorly. Patient's wound bed actually showed signs of doing quite well and I am very pleased with where things stand and I do not see any signs of worsening overall which is great news. 08-04-2022 upon evaluation today patient appears to be doing well currently in regard to his heel ulcer. I am actually very pleased with where things stand and I do think this is looking significantly better. With regard to his right shin that is also showing signs of improvement which is great news. 08-10-2022 upon evaluation today patient's wound on the heel actually appears to be doing significantly better. In regard to the right anterior shin this is showing signs of healing it might even be completely healed but I still get a  monitor I cannot get anything to fill away but also could not see where there was anything actually draining at this point. I am tending to think healed but I want a monitor 1 more week before I call it for sure. 08-17-2022 upon evaluation today patient appears to be doing well currently in regard to his heel. Fortunately he is tolerating the dressing changes without complication. I do not see any signs of infection and overall I think he is making good progress here. We did get approval for  the Apligraf but I think he had a $295 co-pay that would have to be paid per application. With that being said as good as he is doing right now I do not think that is necessary but is still an option depending on how things progress if we need to speed this up quite a bit. 08/24/2022; this patient has a wound on his left heel in the setting of type 2 diabetes. We have been using Prisma. He has a heel offloading boot 08-31-2022 upon evaluation today patient appears to be doing poorly in regard to his heel ulcer which is still showing signs of bruising I am just not as happy as I was when I saw him 2 weeks ago with this. He saw Dr. Leanord Hawking last week. Also did not look good at that point apparently. Nonetheless I think that the patient is still continuing to have some counterpressure getting to the wound bed unfortunately. 09-07-2022 upon evaluation today patient appears to be doing well currently in regard to his heel ulcer. He has been tolerating the dressing changes without KOFI, PRINKEY ([id number]) [id number] Page 9 of 11 complication. Fortunately there does not appear to be any signs of active infection locally or systemically at this time. Fortunately I do not see any evidence of active infection at this time. 09-15-2022 upon evaluation today patient appears to be doing well currently in regard to his legs which in fact appear to be completely healed this is great news. With that being said his heel does have some erythema and warmth as well as drainage I am concerned about cellulitis at this location. 09-21-2022 upon evaluation today patient's wound is actually showing signs of being a little bit smaller but still we are not doing nearly as well as what I would like to see. Fortunately I do not see any signs of infection at this time which is great news and overall I am extremely pleased in that regard. With that being said I do think that he needs to continue to  elevate his leg although the edema is under great control with a compression wrap I think switching to a different dressing topically may be beneficial. My suggestion is to switch him over to a Iodoflex dressing. 09-28-2022 upon evaluation today patient appears to be doing well with regard to his heel ulcer. This is actually showing signs of improvement which is great news and overall I am extremely pleased with where we stand today. 10-06-2022 upon evaluation today patient actually appears to be making excellent progress at this point. Fortunately there does not appear to be any signs of active infection locally nor systemically which is great news and overall I am extremely pleased with where we stand. I do feel like he is actually making some pretty good progress here as we switch to the wrap along with the Iodoflex. 12/26; patient has a wound on the tip of his left heel. This is not a weightbearing surface. He  is using a heel offloading boot and carefully offloading this at other times during the day. He is using Iodoflex and Zetuvitunder 3 layer compression 10-22-2022 upon evaluation today patient appears to be doing well currently in regard to his wound. He has been tolerating the dressing changes without complication. Fortunately there does not appear to be any signs of active infection locally nor systemically at this time. No fevers, chills, nausea, vomiting, or diarrhea. 08-30-2023 upon evaluation today patient appears to be doing well currently in regard to his heel ulcer which is showing signs of good improvement. Fortunately I do not see any evidence of infection locally nor systemically which is great news and overall I am extremely pleased with where we stand. No fevers, chills, nausea, vomiting, or diarrhea. Unfortunately he does have a wound on the side of his fifth toe where I think this did rub on the shoe. 11-05-2022 upon evaluation today patient still still showing some signs of  pressure at this point and there may have even been some fluid collection at this time. Fortunately I do not see any evidence of active infection locally nor systemically which is great news and overall I am extremely pleased with where we stand. No fevers, chills, nausea, vomiting, or diarrhea. 1/25; the patient's area on the dorsal aspect of the left fifth toe is healed. We have been using Iodoflex to the area on the left heel. The lateral wound is slightly down in length. 11-19-2022 upon evaluation today patient appears to be doing well currently in regard to his wound. He has been tolerating the dressing changes without complication. Fortunately there does not appear to be any signs of infection we been using Iodoflex up to this point. 11-26-2022 upon evaluation today patient appears to be doing poorly currently in regard to his wound. He has been tolerating the dressing changes with the Iodoflex without complication. With that being said he does not have any signs of active infection systemically though locally I do feel like there is some evidence of infection here. 12-03-2022 upon evaluation today patient appears to be doing well currently in regard to his wound this is better with the Bactrim compared to last week still there was some significant slough and biofilm buildup which is going require sharp debridement at this point. Fortunately I do not see any signs of active infection locally nor systemically which is great news. 12-11-2022 upon evaluation today patient actually appears to be doing significantly better at this time in regard to his heel. I am actually very pleased with where things stand currently. There is no signs of active infection locally or systemically at this point. 12-17-2022 upon evaluation today patient appears to be doing better as far as the overall appearance of his wound. I am actually very pleased in that regard. Fortunately there does not appear to be any signs of  active infection locally nor systemically at this time. I do feel like that the patient is showing signs of excellent improvement in general. Nonetheless he needs something better for compression I think he should go ahead and see about getting compression socks. 3/7; tip of the left heel measurements are smaller he has been using Hydrofera Blue 01-07-2023 upon evaluation today patient appears to be doing well currently in regard to his wound. This has not been debrided since I last saw him and it definitely shows that definitely needs some debridement today. Fortunately I do not see any evidence of active infection locally nor systemically which is  great news. 01-14-2023 upon evaluation today patient appears to be doing well currently although he does have some erythema and warmth to the heel I feel like he may have developed an infection yet again. This is something that is been ongoing issue we have been trying to manage his foot best we could. With that being said I do think he may need to go back on the Bactrim this has done well for him in the past. 01-21-2023 upon evaluation today patient appears to be doing better in regard to his heel. He is doing very well with the antibiotics and that is made a big difference for him based on what I am seeing I am very pleased with where we stand today. He is obviously doing extremely well which is great news. 01-28-2023 upon evaluation today patient appears to be doing well currently in regard to his wound. This is actually showing signs of excellent improvement I am actually very pleased with where we stand and I do believe that he is making good progress here. Fortunately I do not see any evidence of active infection locally nor systemically which is great news. No fevers, chills, nausea, vomiting, or diarrhea. 02-04-2023 upon evaluation today patient's wound is actually showed signs of excellent improvement I am actually very pleased with where we stand. I do  not see any signs of active infection locally nor systemically which is great news and in general I do believe that we are moving in the right direction here. No fevers, chills, nausea, vomiting, or diarrhea. 02-11-2023 upon evaluation patient's wound actually showing signs of excellent improvement I am actually very pleased with where we stand I think that his pain is better there is no signs of infection we will continue to use the gentamicin and overall we are making excellent progress. 02-18-2023 upon evaluation today patient appears to be doing well currently in regard to his wound. He has been tolerating the dressing changes without complication. Fortunately I do not see any signs of active infection locally nor systemically which is great news. [name] XIN, BOSSHARDT ([id number]) [id number] Page 10 of 11 Constitutional Well-nourished and well-hydrated in no acute distress. Vitals Time Taken: 1:22 PM, Height: 70 in, Weight: 265 lbs, BMI: 38, Temperature: 98.5 F, Pulse: 68 bpm, Respiratory Rate: 18 breaths/min, Blood Pressure: 118/79 mmHg. Respiratory normal breathing without difficulty. Psychiatric this patient is able to make decisions and demonstrates good insight into disease process. Alert and Oriented x 3. pleasant and cooperative. General Notes: Upon inspection patient's wound bed did require sharp debridement this is measuring smaller and looking much better but again I did have to perform some debridement clearway some of the slough and biofilm on the surface of the wound down to good subcutaneous tissue today without complication and postdebridement this appears to be much improved. he tolerated that Integumentary (Hair, Skin) Wound #10 status is Open. Original cause of wound was Gradually Appeared. The date acquired was: 06/05/2022. The wound has been in treatment 34 weeks. The wound is located on the Left Calcaneus. The wound measures 1.2cm length  x 1.5cm width x 0.1cm depth; 1.414cm^2 area and 0.141cm^3 volume. There is Fat Layer (Subcutaneous Tissue) exposed. There is a medium amount of serosanguineous drainage noted. The wound margin is flat and intact. There is small (1-33%) pink granulation within the wound bed. There is a large (67-100%) amount of necrotic tissue within the wound bed including Adherent Slough. 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 There was a Excisional Skin/Subcutaneous Tissue Debridement with a total area of 1.41 sq cm performed by [name] Derry, PA-C. With the following instrument(s): Curette to remove Viable and Non-Viable tissue/material. Material removed includes Subcutaneous Tissue and Slough and. No specimens were taken. A time out was conducted at 13:38, prior to the start of the procedure. A Minimum amount of bleeding was controlled with Pressure. The procedure was tolerated well with a pain level of 0 throughout and a pain level of 0 following the procedure. Post Debridement Measurements: 1.2cm length x 1.5cm width x 0.2cm depth; 0.283cm^3 volume. Character of Wound/Ulcer Post Debridement is stable. Severity of Tissue Post Debridement is: Fat layer exposed. Post procedure Diagnosis Wound #10: Same as Pre-Procedure Plan Follow-up Appointments: Wound #10 Left Calcaneus: Return Appointment in 1 week. Bathing/ Shower/ Hygiene: May shower; gently cleanse wound with antibacterial soap, rinse and pat dry prior to dressing wounds Non-Wound Condition: Additional non-wound orders/instructions: - Apply 40% Urea cream to dry cracked skin on  feet. Off-Loading: Open toe surgical shoe with peg assist. WOUND #10: - Calcaneus Wound Laterality: Left Topical: Gentamicin 3 x Per Week/30 Days Discharge Instructions: Apply as directed by provider. Prim Dressing: Hydrofera Blue Ready Transfer Foam, 2.5x2.5 (in/in) (Dispense As Written) 3 x Per Week/30 Days ary Discharge Instructions: Apply Hydrofera Blue Ready to wound bed as directed Secondary Dressing: ABD Pad 5x9 (in/in) (Generic) 3 x Per Week/30 Days Discharge Instructions: Cover with ABD pad Secured With: Kerlix Roll Sterile or Non-Sterile 6-ply 4.5x4 (yd/yd) 3 x Per Week/30 Days Discharge Instructions: Apply Kerlix as directed Secured With: Tubigrip Size D, 3x10 (in/yd) 3 x Per Week/30 Days 1. I would recommend currently that we have the patient continue to monitor for any signs of infection or worsening. Based on what I am seeing I do believe removing in the right direction. 2. I am also can recommend that we continue with the [facility] followed by the ABD pad and roll gauze and secure in place. 3. I would recommend he continue with the Tubigrip as well. RAYMUNDO, PINALES ([id number]) [id number] Page 11 of 11 4. I would also suggest that with his increased swelling he should continue with his furosemide which he is doubled up on. However if he continues to have issues despite this I would recommend that he Needs to get in touch with his cardiologist ASAP he is actually to be seen in the next week. Once things get worse and sooner he needs to talk to him sooner. We will see patient back for reevaluation in 1 week here in the clinic. If anything worsens or changes patient will contact our office for additional recommendations. Electronic Signature(s) Signed: 02/18/2023 1:54:02 PM By: [name] Derry PA-C Entered By: [name] Derry on 02/18/2023 13:54:01 -------------------------------------------------------------------------------- SuperBill  Details Patient Name: Date of Service: [name], [name] 02/18/2023 Medical Record Number: [id number] Patient Account Number: [id number] Date of Birth/Sex: Treating RN: [birth year] (77 y.o. [name] Primary Care Provider: [name] Other Clinician: Referring Provider: Treating Provider/Extender: Hermine Messick Weeks in Treatment: 34 Diagnosis Coding ICD-10 Codes Code Description E11.621 Type 2 diabetes mellitus with foot ulcer L97.522 Non-pressure chronic ulcer of other part of left foot with  fat layer exposed E11.42 Type 2 diabetes mellitus with diabetic polyneuropathy Facility Procedures : CPT4 Code: [id number] Description: 11042 - DEB SUBQ TISSUE 20 SQ CM/< ICD-10 Diagnosis Description 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sed Modifier: Quantity: 1 Physician Procedures : CPT4 Code Description Modifier [id number] 11042 - WC PHYS SUBQ TISS 20 SQ CM ICD-10 Diagnosis Description 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sed Quantity: 1 Electronic Signature(s) Signed: 02/18/2023 1:54:10 PM By: [name] Derry PA-C Entered By: [name] Derry on 02/18/2023 13:54:09

## 2023-02-25 ENCOUNTER — Encounter: Payer: Medicare HMO | Admitting: Physician Assistant

## 2023-02-25 DIAGNOSIS — E11621 Type 2 diabetes mellitus with foot ulcer: Secondary | ICD-10-CM | POA: Diagnosis not present

## 2023-02-25 NOTE — Progress Notes (Addendum)
JAMAUL, FENDLEY ([id number]) [id number] Page 1 of 10 Visit Report for 02/25/2023 Chief Complaint Document Details Patient Name: Date of Service: [name], GEO [facility] J. 02/25/2023 10:45 A M Medical Record Number: [id number] Patient Account Number: [id number] Date of Birth/Sex: Treating RN: 03/05/1946 (77 y.o. [name] Primary Care Provider: [name] Other Clinician: Betha Loa Referring Provider: Treating Provider/Extender: Hermine Messick Weeks in Treatment: 35 Information Obtained from: Patient Chief Complaint Left heel ulcer Electronic Signature(s) Signed: 02/25/2023 10:49:51 AM By: [name] Derry PA-C Entered By: [name] Derry on 02/25/2023 10:49:51 -------------------------------------------------------------------------------- HPI Details Patient Name: Date of Service: [name], [name] 02/25/2023 10:45 A M Medical Record Number: [id number] Patient Account Number: [id number] Date of Birth/Sex: Treating RN: 01-30-1946 (77 y.o. [name] Primary Care Provider: [name] Other Clinician: Betha Loa Referring Provider: Treating Provider/Extender: Hermine Messick Weeks in Treatment: 35 History of Present Illness HPI Description: 09/24/2020 on evaluation today patient presents today for a heel ulcer that he tells me has been present for about 2 years. He has been seeing podiatry and they have been attempting to manage this including what sounds to be a total contact cast, Unna boot, and just standard dressings otherwise as well. Most recently has been using triple antibiotic ointment. With that being said unfortunately despite everything he really has not had any significant improvement. He tells me that he cannot even really remember exactly how this began but he presumed it may have rubbed on his shoes or something of that nature. 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 He tells me that this does seem to help. Fortunately there is no signs of active infection at this time. His most recent hemoglobin A1c was 8.1 though he knows this was this year he cannot tell me the exact time. His fluid pills currently to help with some of the lower extremity edema although he does obviously have signs of venous stasis/lymphedema. Currently there is no evidence of active infection. No fevers, chills, nausea, vomiting, or diarrhea. Patient has had fairly recent ABIs which were performed on 07/19/2020 and revealed that he has normal findings in both the ankle and toe locations bilaterally. His ABI on the right was 1.09 on the left was 1.08 with a TBI on the right of 0.88 and on the left of 0.94. Triphasic flow was noted throughout. 10/08/2020 on evaluation today patient appears to be doing pretty well in regard to his left heel currently in fact this is doing a great job and seems to be healing quite nicely. Unfortunately on his right leg he had a pile of wood that actually fell on him injuring his right leg this is somewhat erythematous has me concerned little bit about cellulitis though there is not really a good area to culture at this point. 10/24/2020 upon evaluation today patient appears to be doing well with regard to his heel ulcer. He is showing signs of improvement which is great news. His right leg is completely healed. Overall I feel like he is doing excellent and there is no signs of infection. 11/07/2020 upon evaluation today patient appears to be doing well with regard to his heel ulcer. He tells me that  last week when he was unable to come in his wife actually thought that the wound was very close to closing if not  closed. Then it began to "reopen again". I really feel like what may have happened as the collagen may have dried over the wound bed and that because that misunderstanding with thinking that the wound was healing. With that being said I did not see it last week I do not know that for certain. Either way I feel like he is doing great today I see no signs of infection at this point. 11/26/2020 upon inspection today patient appears to be doing decently well in regard to his heel ulcer. He has been tolerating the dressing changes without complication. Fortunately there is no sign of active infection at this time. No fevers, chills, nausea, vomiting, or diarrhea. 12/10/2020 upon evaluation today patient appears to be doing fairly well in regard to the wound on his heel as well as what appears to be a new wound of the left first metatarsal head plantar aspect. This seems to be an area that was callus that has split as the patient tells me has been trying to walk on his toes more has probably where this came from. With that being said there does not appear to be signs of active infection which is great news. 12/17/2020 upon evaluation today patient actually appears to be making good progress currently. Fortunately there is no evidence of active infection at this time. Overall I feel like he is very close to complete closure. 12/26/2020 upon evaluation today patient appears to be doing excellent in regard to his wounds. In fact I am not certain that these are not even completely healed on initial inspection. Overall I am very pleased with where things stand today. Good news is that they are healed he is actually get ready to go out of town and that will be helpful as well as he will be a full part of the time. Readmission: 02/04/2021 upon evaluation today patient appears to be doing well at this point in regard to his left heel that I previously saw him for. Unfortunately he is having SAMIK, BAPST ([id number])  [id number] Page 2 of 10 issues with his right lower extremity. He has significant wounds at this point he also has erythema noted there is definite signs of cellulitis which is unfortunate. With that being said I think we do need to address this sooner rather than later. The good news is he did have a nice trip to the beach. He tells me that he had no issues during that time. 4/27; patient on Bactrim. Culture showed methicillin sensitive staph aureus therefore the Bactrim should be effective. 2 small areas on the right leg are healed the area on the mid aspect of the tibia almost 100% covered in a very adherent necrotic debris. We have been using silver alginate 02/20/2021 upon evaluation today patient appears to be doing well with regard to his wound. He is showing signs of improvement which is great news overall very pleased with where things stand today. No fevers, chills, nausea, vomiting, or diarrhea. 02/27/2021 upon evaluation today patient appears to be doing well with regard to his leg ulcer. He is tolerating the dressing changes and overall appears to be doing quite excellent. I am extremely pleased with where things stand and overall I think patient is making great progress. There is no sign of active infection at this time which is also great news. 03/06/2021 upon evaluation  today patient appears to be doing excellent in regard to his wounds. In fact he appears to be completely healed today based on what I am seeing. This is excellent news and overall I am extremely pleased with where he stands. Overall the patient is happy to hear this as well this has been quite sometime coming. Readmission: 04/01/2021 patient unfortunately returns for readmission today. He tells me that he has been wearing his compression socks on the right leg daily and he does not really know what is going on and why his legs are doing what they are doing. We did therefore go ahead and probe  deeper into exactly what has been going on with him today. Subsequently the patient tells me that when he gets up and what we would call "first thing in the morning" are really 2 different things". He does not tend to sleep well so he tells me that he will often wake up at 3:00 in the morning. He will then potentially going into the living room to get in his chair where he may read a book for a little while and then potentially fall asleep back in his chair. He then subsequently wake up around 5:00 or so and then get up and in his words "putter around". This often will end with him proceeding at some point around 8 AM or 9 AM to putting on his compression socks. With that being said this means that anywhere from the 3:00 in the morning till roughly around 9:00 in the morning he has no compression on yet he is sitting in his recliner, walking around and up and about, and this is at least 5 to 6 hours of noncompressed time. That may be our issue here but I did not realize until we discussed this further today. Fortunately there does not appear to be any signs of active infection at this time which is great news. With that being said he has multiple wounds of the bilateral lower extremities. 04/10/2021 upon evaluation today patient appears to be doing well with regard to his legs for the most part. He does look like he had some injury where his wrap slid down nonetheless he did some "doctoring on them". I do feel like most of the areas honestly have cleared back up which is great news. There does not appear to be any signs of active infection at this time which is also great news. No fevers, chills, nausea, vomiting, or diarrhea. 04/18/2021 upon evaluation today patient appears to be doing well with regard to his wounds in fact he appears to be completely healed which is great news. Fortunately there does not appear to be any signs of active infection which is great news. No fevers, chills, nausea, vomiting,  or diarrhea. READMISSION 07/28/2021 This is a now 77 year old man we have had in this clinic for quite a bit of this year. 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 He wears compression stockings. 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 He comes in an old running shoes although he says he wears better shoes at home. His history is that he noticed a blister in the right posterior heel. This open. He has been applying Neosporin to it currently the wound measures 2 x 1.4 cm. 100% slough covered. Not really offloading this in any rigorous way. Past medical history is essentially unchanged he has paroxysmal atrial fibrillation on Coumadin type  2 diabetes with a recent hemoglobin A1c of 7 on metformin. ABI in our clinic was 0.98 on the right 08/05/2021 upon evaluation today patient appears to be doing well with regard to his heel ulcer. Fortunately there does not appear to be any signs of active infection at this time. Overall I been very pleased with where things seem to be currently as far as the wound healing is concerned. Again this is first a lot of seeing him Dr. Leanord Hawking readmitted him last week. Nonetheless I think we are definitely making some progress here. 08/11/2021 upon evaluation today patient's wound on the heel actually showing signs of excellent improvement. I am actually very pleased with where things stand currently. No fevers, chills, nausea, vomiting, or diarrhea. There does not appear to be any need for sharp debridement today either which is also great news. 08/25/2021 upon evaluation today patient appears to be doing well with regard to his heel ulcer. This is actually looking significantly improved compared to last time I saw him. Fortunately there does not appear to be any evidence of active infection at this time. No fevers, chills, nausea, vomiting, or  diarrhea. 09/01/2021 upon evaluation today patient appears to be doing decently well in regard to his wounds. Fortunately there does not appear to be any signs of active infection at this time which is great news and overall very pleased in that regard. I do not see any evidence of active infection systemically which is great news as well. No fevers, chills, nausea, vomiting, or diarrhea. I think that the heel is making excellent progress. 09/15/2021 upon evaluation today patient's heel unfortunately is significantly worse compared to what it was previous. I do believe that at this time he would benefit from switching back to something a little bit more offloading from the cushion shoe he has right now this is more of a heel protector what I really need is a Prevalon offloading boot which I think is good to do a lot better for him than just the small heel protector. 09/23/2021 upon evaluation today patient appears to be doing a little better in regards to last week's visit. Overall I think that he is making good progress which is great news and there does not appear to be any evidence of active infection at this time. No fevers, chills, nausea, vomiting, or diarrhea. 09/30/2021 upon evaluation patient's heel is actually showing signs of good improvement which is great news. Fortunately there does not appear to be any evidence of active infection locally nor systemically at this point. No fevers, chills, nausea, vomiting, or diarrhea. 10/07/2021 upon evaluation today patient appears to be doing well currently in regard to his heel ulcer. He has been tolerating the dressing changes without complication. Fortunately I do not see any signs of active infection at this time which is great news. No fevers, chills, nausea, vomiting, or diarrhea. 12/27; wound is measuring smaller. We have been using silver alginate 10/28/2021 upon evaluation today patient appears to be doing well currently in regard to his heel  ulcer. I am actually very pleased with where things stand and I think he is making excellent progress. Fortunately I do not see any signs of active infection locally nor systemically at this point which is great news. Nonetheless I do believe that the patient would benefit from a new offloading shoe and has been using it Velcro is no longer functioning properly and he tells me that he almost tripped and fell because of that today. Obviously  I do not want him following that would be very bad. 11/11/2021 upon evaluation today patient appears to be doing excellent in regard to his wound. In fact this appears to be completely healed based on what I see currently. I do not see any signs of anything open or draining and I did double check just by clearing away some of the callus around the edges of the wound to ensure that there was nothing still open or hiding underneath. Once this was cleared away it appeared that the patient was doing significantly better at this time which is great news and there was nothing actually open. JAVALE, ATHANS ([id number]) [id number] Page 3 of 10 Readmission: 06-19-2022 upon evaluation today patient appears to be doing well currently in regard to his heel ulcer all things considered. He unfortunately is having a bit of breakdown in general as far as the heel is concerned not nearly as bad as what we noted last time he was here in the clinic. The good news is I do think that this should hopefully heal much more rapidly than what we noted last time. His past medical history has not changed he is on Coumadin. 07-03-2022 upon evaluation today patient appears to be doing better in regard to his heel. We will start to see some improvement here which is good news. Fortunately I do not see any evidence of infection locally or systemically at this time which is great news. No fevers, chills, nausea, vomiting, or diarrhea. 07-10-2022 upon evaluation today  patient appears to be doing well currently in regard to his wound from the callus standpoint around the edges. Unfortunately from an actual wound bed standpoint he has some deep tissue injury noted at this point. Again I am not sure exactly what is going on that is causing this pressure to the region but he is definitely gotten pressure that is occurring and causing this to not heal as effectively as what we would like to see. I discussed with him today that he may need to at night when he sleeping use a pillow up underneath his calf in order to prevent the heel from touching the bed at all. I also think that it may be beneficial for him to monitor throughout his day and make sure there is no other time when he is getting the pressure to the heel. 07-23-2022 upon evaluation today patient appears to be doing poorly currently in regard to his heel ulcer. He has been tolerating the dressing changes without complication. Unfortunately I feel like he may be infected based on what I am seeing. 07-28-2022 I did review patient's culture results which showed positive for Proteus as well as Staphylococcus both of which would be treated with the Bactrim DS that I gave him at the last visit. This is good news and hopefully means that we should be able to apply the cast today as long as the wound itself does not appear to be doing poorly. Patient's wound bed actually showed signs of doing quite well and I am very pleased with where things stand and I do not see any signs of worsening overall which is great news. 08-04-2022 upon evaluation today patient appears to be doing well currently in regard to his heel ulcer. I am actually very pleased with where things stand and I do think this is looking significantly better. With regard to his right shin that is also showing signs of improvement which is great news. 08-10-2022 upon evaluation today patient's wound  on the heel actually appears to be doing significantly  better. In regard to the right anterior shin this is showing signs of healing it might even be completely healed but I still get a monitor I cannot get anything to fill away but also could not see where there was anything actually draining at this point. I am tending to think healed but I want a monitor 1 more week before I call it for sure. 08-17-2022 upon evaluation today patient appears to be doing well currently in regard to his heel. Fortunately he is tolerating the dressing changes without complication. I do not see any signs of infection and overall I think he is making good progress here. We did get approval for the Apligraf but I think he had a $295 co-pay that would have to be paid per application. With that being said as good as he is doing right now I do not think that is necessary but is still an option depending on how things progress if we need to speed this up quite a bit. 08/24/2022; this patient has a wound on his left heel in the setting of type 2 diabetes. We have been using Prisma. He has a heel offloading boot 08-31-2022 upon evaluation today patient appears to be doing poorly in regard to his heel ulcer which is still showing signs of bruising I am just not as happy as I was when I saw him 2 weeks ago with this. He saw Dr. Leanord Hawking last week. Also did not look good at that point apparently. Nonetheless I think that the patient is still continuing to have some counterpressure getting to the wound bed unfortunately. 09-07-2022 upon evaluation today patient appears to be doing well currently in regard to his heel ulcer. He has been tolerating the dressing changes without complication. Fortunately there does not appear to be any signs of active infection locally or systemically at this time. Fortunately I do not see any evidence of active infection at this time. 09-15-2022 upon evaluation today patient appears to be doing well currently in regard to his legs which in fact appear to be  completely healed this is great news. With that being said his heel does have some erythema and warmth as well as drainage I am concerned about cellulitis at this location. 09-21-2022 upon evaluation today patient's wound is actually showing signs of being a little bit smaller but still we are not doing nearly as well as what I would like to see. Fortunately I do not see any signs of infection at this time which is great news and overall I am extremely pleased in that regard. With that being said I do think that he needs to continue to elevate his leg although the edema is under great control with a compression wrap I think switching to a different dressing topically may be beneficial. My suggestion is to switch him over to a Iodoflex dressing. 09-28-2022 upon evaluation today patient appears to be doing well with regard to his heel ulcer. This is actually showing signs of improvement which is great news and overall I am extremely pleased with where we stand today. 10-06-2022 upon evaluation today patient actually appears to be making excellent progress at this point. Fortunately there does not appear to be any signs of active infection locally nor systemically which is great news and overall I am extremely pleased with where we stand. I do feel like he is actually making some pretty good progress here as we switch to  the wrap along with the Iodoflex. 12/26; patient has a wound on the tip of his left heel. This is not a weightbearing surface. He is using a heel offloading boot and carefully offloading this at other times during the day. He is using Iodoflex and Zetuvitunder 3 layer compression 10-22-2022 upon evaluation today patient appears to be doing well currently in regard to his wound. He has been tolerating the dressing changes without complication. Fortunately there does not appear to be any signs of active infection locally nor systemically at this time. No fevers, chills, nausea, vomiting, or  diarrhea. 08-30-2023 upon evaluation today patient appears to be doing well currently in regard to his heel ulcer which is showing signs of good improvement. Fortunately I do not see any evidence of infection locally nor systemically which is great news and overall I am extremely pleased with where we stand. No fevers, chills, nausea, vomiting, or diarrhea. Unfortunately he does have a wound on the side of his fifth toe where I think this did rub on the shoe. 11-05-2022 upon evaluation today patient still still showing some signs of pressure at this point and there may have even been some fluid collection at this time. Fortunately I do not see any evidence of active infection locally nor systemically which is great news and overall I am extremely pleased with where we stand. No fevers, chills, nausea, vomiting, or diarrhea. 1/25; the patient's area on the dorsal aspect of the left fifth toe is healed. We have been using Iodoflex to the area on the left heel. The lateral wound is slightly down in length. 11-19-2022 upon evaluation today patient appears to be doing well currently in regard to his wound. He has been tolerating the dressing changes without complication. Fortunately there does not appear to be any signs of infection we been using Iodoflex up to this point. 11-26-2022 upon evaluation today patient appears to be doing poorly currently in regard to his wound. He has been tolerating the dressing changes with the Iodoflex without complication. With that being said he does not have any signs of active infection systemically though locally I do feel like there is some evidence of infection here. 12-03-2022 upon evaluation today patient appears to be doing well currently in regard to his wound this is better with the Bactrim compared to last week still there was some significant slough and biofilm buildup which is going require sharp debridement at this point. Fortunately I do not see any signs of  active infection locally nor systemically which is great news. 12-11-2022 upon evaluation today patient actually appears to be doing significantly better at this time in regard to his heel. I am actually very pleased with where things stand currently. There is no signs of active infection locally or systemically at this point. YVON, DECK ([id number]) [id number] Page 4 of 10 12-17-2022 upon evaluation today patient appears to be doing better as far as the overall appearance of his wound. I am actually very pleased in that regard. Fortunately there does not appear to be any signs of active infection locally nor systemically at this time. I do feel like that the patient is showing signs of excellent improvement in general. Nonetheless he needs something better for compression I think he should go ahead and see about getting compression socks. 3/7; tip of the left heel measurements are smaller he has been using Hydrofera Blue 01-07-2023 upon evaluation today patient appears to be doing well currently in regard to his wound. This  has not been debrided since I last saw him and it definitely shows that definitely needs some debridement today. Fortunately I do not see any evidence of active infection locally nor systemically which is great news. 01-14-2023 upon evaluation today patient appears to be doing well currently although he does have some erythema and warmth to the heel I feel like he may have developed an infection yet again. This is something that is been ongoing issue we have been trying to manage his foot best we could. With that being said I do think he may need to go back on the Bactrim this has done well for him in the past. 01-21-2023 upon evaluation today patient appears to be doing better in regard to his heel. He is doing very well with the antibiotics and that is made a big difference for him based on what I am seeing I am very pleased with where we stand  today. He is obviously doing extremely well which is great news. 01-28-2023 upon evaluation today patient appears to be doing well currently in regard to his wound. This is actually showing signs of excellent improvement I am actually very pleased with where we stand and I do believe that he is making good progress here. Fortunately I do not see any evidence of active infection locally nor systemically which is great news. No fevers, chills, nausea, vomiting, or diarrhea. 02-04-2023 upon evaluation today patient's wound is actually showed signs of excellent improvement I am actually very pleased with where we stand. I do not see any signs of active infection locally nor systemically which is great news and in general I do believe that we are moving in the right direction here. No fevers, chills, nausea, vomiting, or diarrhea. 02-11-2023 upon evaluation patient's wound actually showing signs of excellent improvement I am actually very pleased with where we stand I think that his pain is better there is no signs of infection we will continue to use the gentamicin and overall we are making excellent progress. 02-18-2023 upon evaluation today patient appears to be doing well currently in regard to his wound. He has been tolerating the dressing changes without complication. Fortunately I do not see any signs of active infection locally nor systemically which is great news. 02-25-2023 upon evaluation today patient's heel actually showed signs of excellent improvement. I am actually very pleased with where we stand today and I feel like that he is making really good progress. Fortunately I do not see any signs of active infection which is good news. No fevers, chills, nausea, vomiting, or diarrhea. Electronic Signature(s) Signed: 02/25/2023 12:57:12 PM By: [name] Derry PA-C Entered By: [name] Derry on 02/25/2023 12:57:11 -------------------------------------------------------------------------------- Physical Exam  Details Patient Name: Date of Service: [name], [name] 02/25/2023 10:45 A M Medical Record Number: [id number] Patient Account Number: [id number] Date of Birth/Sex: Treating RN: 06-Mar-1946 (77 y.o. [name] Primary Care Provider: [name] Other Clinician: Betha Loa Referring Provider: Treating Provider/Extender: Hermine Messick Weeks in Treatment: 35 Constitutional Well-nourished and well-hydrated in no acute distress. Respiratory normal breathing without difficulty. Psychiatric this patient is able to make decisions and demonstrates good insight into disease process. Alert and Oriented x 3. pleasant and cooperative. Notes Upon inspection patient's wound again showed signs of good granulation epithelization at this point. Fortunately I do not see any signs of infection. No need for sharp debridement was noted today. Electronic Signature(s) Signed: 02/25/2023 12:58:21 PM By: [name] Derry PA-C Entered By: [name] Derry  on 02/25/2023 12:58:21 -------------------------------------------------------------------------------- Physician Orders Details Patient Name: Date of Service: [name], GEO [facility] J. 02/25/2023 10:45 A M Medical Record Number: [id number] Patient Account Number: [id number] Date of Birth/Sex: Treating RN: 29-Sep-1946 (77 y.o. 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 Bathing/ Shower/ Hygiene May shower; gently cleanse wound with antibacterial soap, rinse and pat dry prior to dressing wounds Non-Wound Condition dditional non-wound orders/instructions: - Apply 40% Urea cream to dry cracked skin on feet. A Off-Loading Open toe surgical shoe with peg assist. Wound Treatment Wound #10 - Calcaneus Wound Laterality: Left Topical: Gentamicin 3 x Per Week/30 Days Discharge Instructions: Apply as directed by provider. Prim Dressing: Hydrofera Blue Ready Transfer Foam, 2.5x2.5 (in/in) (Dispense As Written) 3 x Per Week/30 Days ary Discharge Instructions: Apply Hydrofera Blue Ready to wound bed as directed Secondary Dressing: ABD Pad 5x9 (in/in) (Generic) 3 x Per Week/30 Days Discharge Instructions: Cover with ABD pad Secured With: Kerlix Roll Sterile or Non-Sterile 6-ply 4.5x4 (yd/yd) 3 x Per Week/30 Days Discharge Instructions: Apply Kerlix as directed Secured With: Tubigrip Size D, 3x10 (in/yd) 3 x Per Week/30 Days Electronic Signature(s) Signed: 02/25/2023 4:04:13 PM By: [name] Derry PA-C Signed: 02/26/2023 12:57:35 PM By: Betha Loa Entered By: Betha Loa on 02/25/2023 11:17:09 -------------------------------------------------------------------------------- Problem List Details Patient Name: Date of Service: [name], [name] 02/25/2023 10:45 A M Medical Record Number: [id number] Patient Account Number: [id number] Date of Birth/Sex: Treating RN: 01-20-1946 (77 y.o. [name] Primary Care Provider: [name] Other Clinician: Betha Loa Referring Provider: Treating Provider/Extender: Hermine Messick Weeks in Treatment: 35 Active Problems ICD-10 Encounter Code Description Active Date MDM Diagnosis E11.621 Type 2 diabetes mellitus with foot ulcer 06/19/2022 No Yes 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sed  06/19/2022 No Yes E11.42 Type 2 diabetes mellitus with diabetic polyneuropathy 06/19/2022 No Yes TARI, JOINTER ([id number]) [id number] Page 6 of 10 Inactive Problems Resolved Problems Electronic Signature(s) Signed: 02/25/2023 10:49:48 AM By: [name] Derry PA-C Entered By: [name] Derry on 02/25/2023 10:49:48 -------------------------------------------------------------------------------- Progress Note Details Patient Name: Date of Service: [name], [name] 02/25/2023 10:45 A M Medical Record Number: [id number] Patient Account Number: [id number] Date of Birth/Sex: Treating RN: 27-Sep-1946 (77 y.o. [name] Primary Care Provider: [name] Other Clinician: Betha Loa Referring Provider: Treating Provider/Extender: Hermine Messick Weeks in Treatment: 35 Subjective Chief Complaint Information obtained from Patient Left heel ulcer History of Present Illness (HPI) 09/24/2020 on evaluation today patient presents today for a heel ulcer that he tells me has been present for about 2 years. He has been seeing podiatry and they have been attempting to manage this including what sounds to be a total contact cast, Unna boot, and just standard dressings otherwise as well. Most recently has been using triple antibiotic ointment. With that being said unfortunately despite everything he really has not had any significant improvement. He tells me that he cannot even really remember exactly how  this began but he presumed it may have rubbed on his shoes or something of that nature. 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 He tells me that this does seem to help. Fortunately  there is no signs of active infection at this time. His most recent hemoglobin A1c was 8.1 though he knows this was this year he cannot tell me the exact time. His fluid pills currently to help with some of the lower extremity edema although he does obviously have signs of venous stasis/lymphedema. Currently there is no evidence of active infection. No fevers, chills, nausea, vomiting, or diarrhea. Patient has had fairly recent ABIs which were performed on 07/19/2020 and revealed that he has normal findings in both the ankle and toe locations bilaterally. His ABI on the right was 1.09 on the left was 1.08 with a TBI on the right of 0.88 and on the left of 0.94. Triphasic flow was noted throughout. 10/08/2020 on evaluation today patient appears to be doing pretty well in regard to his left heel currently in fact this is doing a great job and seems to be healing quite nicely. Unfortunately on his right leg he had a pile of wood that actually fell on him injuring his right leg this is somewhat erythematous has me concerned little bit about cellulitis though there is not really a good area to culture at this point. 10/24/2020 upon evaluation today patient appears to be doing well with regard to his heel ulcer. He is showing signs of improvement which is great news. His right leg is completely healed. Overall I feel like he is doing excellent and there is no signs of infection. 11/07/2020 upon evaluation today patient appears to be doing well with regard to his heel ulcer. He tells me that last week when he was unable to come in his wife actually thought that the wound was very close to closing if not closed. Then it began to "reopen again". I really feel like what may have happened as the collagen may have dried over the wound bed and that because that misunderstanding with thinking that the wound was healing. With that being said I did not see it last week I do not know that for certain. Either way I feel like  he is doing great today I see no signs of infection at this point. 11/26/2020 upon inspection today patient appears to be doing decently well in regard to his heel ulcer. He has been tolerating the dressing changes without complication. Fortunately there is no sign of active infection at this time. No fevers, chills, nausea, vomiting, or diarrhea. 12/10/2020 upon evaluation today patient appears to be doing fairly well in regard to the wound on his heel as well as what appears to be a new wound of the left first metatarsal head plantar aspect. This seems to be an area that was callus that has split as the patient tells me has been trying to walk on his toes more has probably where this came from. With that being said there does not appear to be signs of active infection which is great news. 12/17/2020 upon evaluation today patient actually appears to be making good progress currently. Fortunately there is no evidence  of active infection at this time. Overall I feel like he is very close to complete closure. 12/26/2020 upon evaluation today patient appears to be doing excellent in regard to his wounds. In fact I am not certain that these are not even completely healed on initial inspection. Overall I am very pleased with where things stand today. Good news is that they are healed he is actually get ready to go out of town and that will be helpful as well as he will be a full part of the time. Readmission: 02/04/2021 upon evaluation today patient appears to be doing well at this point in regard to his left heel that I previously saw him for. Unfortunately he is having issues with his right lower extremity. He has significant wounds at this point he also has erythema noted there is definite signs of cellulitis which is unfortunate. With that being said I think we do need to address this sooner rather than later. The good news is he did have a nice trip to the beach. He tells me that he had no issues during that  time. 4/27; patient on Bactrim. Culture showed methicillin sensitive staph aureus therefore the Bactrim should be effective. 2 small areas on the right leg are healed the area on the mid aspect of the tibia almost 100% covered in a very adherent necrotic debris. We have been using silver alginate 02/20/2021 upon evaluation today patient appears to be doing well with regard to his wound. He is showing signs of improvement which is great news overall very pleased with where things stand today. No fevers, chills, nausea, vomiting, or diarrhea. 02/27/2021 upon evaluation today patient appears to be doing well with regard to his leg ulcer. He is tolerating the dressing changes and overall appears to be doing quite excellent. I am extremely pleased with where things stand and overall I think patient is making great progress. There is no sign of active infection at KAENAN, HIMMELBERGER ([id number]) [id number] Page 7 of 10 this time which is also great news. 03/06/2021 upon evaluation today patient appears to be doing excellent in regard to his wounds. In fact he appears to be completely healed today based on what I am seeing. This is excellent news and overall I am extremely pleased with where he stands. Overall the patient is happy to hear this as well this has been quite sometime coming. Readmission: 04/01/2021 patient unfortunately returns for readmission today. He tells me that he has been wearing his compression socks on the right leg daily and he does not really know what is going on and why his legs are doing what they are doing. We did therefore go ahead and probe deeper into exactly what has been going on with him today. Subsequently the patient tells me that when he gets up and what we would call "first thing in the morning" are really 2 different things". He does not tend to sleep well so he tells me that he will often wake up at 3:00 in the morning. He will then potentially  going into the living room to get in his chair where he may read a book for a little while and then potentially fall asleep back in his chair. He then subsequently wake up around 5:00 or so and then get up and in his words "putter around". This often will end with him proceeding at some point around 8 AM or 9 AM to putting on his compression socks. With that being said this means  that anywhere from the 3:00 in the morning till roughly around 9:00 in the morning he has no compression on yet he is sitting in his recliner, walking around and up and about, and this is at least 5 to 6 hours of noncompressed time. That may be our issue here but I did not realize until we discussed this further today. Fortunately there does not appear to be any signs of active infection at this time which is great news. With that being said he has multiple wounds of the bilateral lower extremities. 04/10/2021 upon evaluation today patient appears to be doing well with regard to his legs for the most part. He does look like he had some injury where his wrap slid down nonetheless he did some "doctoring on them". I do feel like most of the areas honestly have cleared back up which is great news. There does not appear to be any signs of active infection at this time which is also great news. No fevers, chills, nausea, vomiting, or diarrhea. 04/18/2021 upon evaluation today patient appears to be doing well with regard to his wounds in fact he appears to be completely healed which is great news. Fortunately there does not appear to be any signs of active infection which is great news. No fevers, chills, nausea, vomiting, or diarrhea. READMISSION 07/28/2021 This is a now 77 year old man we have had in this clinic for quite a bit of this year. 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 He wears compression stockings. 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 He comes in an old running shoes although he says he wears better shoes at home. His history is that he noticed a blister in the right posterior heel. This open. He has been applying Neosporin to it currently the wound measures 2 x 1.4 cm. 100% slough covered. Not really offloading this in any rigorous way. Past medical history is essentially unchanged he has paroxysmal atrial fibrillation on Coumadin type 2 diabetes with a recent hemoglobin A1c of 7 on metformin. ABI in our clinic was 0.98 on the right 08/05/2021 upon evaluation today patient appears to be doing well with regard to his heel ulcer. Fortunately there does not appear to be any signs of active infection at this time. Overall I been very pleased with where things seem to be currently as far as the wound healing is concerned. Again this is first a lot of seeing him Dr. Leanord Hawking readmitted him last week. Nonetheless I think we are definitely making some progress here. 08/11/2021 upon evaluation today patient's wound on the heel actually showing signs of excellent improvement. I am actually very pleased with where things stand currently. No fevers, chills, nausea, vomiting, or diarrhea. There does not appear to be any need for sharp debridement today either which is also great news. 08/25/2021 upon evaluation today patient appears to be doing well with regard to his heel ulcer. This is actually looking significantly improved compared to last time I saw him. Fortunately there does not appear to be any evidence of active infection at this time. No fevers, chills, nausea, vomiting, or diarrhea. 09/01/2021 upon evaluation today patient appears to be doing decently well in regard to his wounds. Fortunately there does not appear to be any signs of active infection at this time which is great news and overall very pleased in that regard. I do not see any evidence of active  infection systemically which is great  news as well. No fevers, chills, nausea, vomiting, or diarrhea. I think that the heel is making excellent progress. 09/15/2021 upon evaluation today patient's heel unfortunately is significantly worse compared to what it was previous. I do believe that at this time he would benefit from switching back to something a little bit more offloading from the cushion shoe he has right now this is more of a heel protector what I really need is a Prevalon offloading boot which I think is good to do a lot better for him than just the small heel protector. 09/23/2021 upon evaluation today patient appears to be doing a little better in regards to last week's visit. Overall I think that he is making good progress which is great news and there does not appear to be any evidence of active infection at this time. No fevers, chills, nausea, vomiting, or diarrhea. 09/30/2021 upon evaluation patient's heel is actually showing signs of good improvement which is great news. Fortunately there does not appear to be any evidence of active infection locally nor systemically at this point. No fevers, chills, nausea, vomiting, or diarrhea. 10/07/2021 upon evaluation today patient appears to be doing well currently in regard to his heel ulcer. He has been tolerating the dressing changes without complication. Fortunately I do not see any signs of active infection at this time which is great news. No fevers, chills, nausea, vomiting, or diarrhea. 12/27; wound is measuring smaller. We have been using silver alginate 10/28/2021 upon evaluation today patient appears to be doing well currently in regard to his heel ulcer. I am actually very pleased with where things stand and I think he is making excellent progress. Fortunately I do not see any signs of active infection locally nor systemically at this point which is great news. Nonetheless I do believe that the patient would benefit from a new offloading shoe and has been using it Velcro  is no longer functioning properly and he tells me that he almost tripped and fell because of that today. Obviously I do not want him following that would be very bad. 11/11/2021 upon evaluation today patient appears to be doing excellent in regard to his wound. In fact this appears to be completely healed based on what I see currently. I do not see any signs of anything open or draining and I did double check just by clearing away some of the callus around the edges of the wound to ensure that there was nothing still open or hiding underneath. Once this was cleared away it appeared that the patient was doing significantly better at this time which is great news and there was nothing actually open. Readmission: 06-19-2022 upon evaluation today patient appears to be doing well currently in regard to his heel ulcer all things considered. He unfortunately is having a bit of breakdown in general as far as the heel is concerned not nearly as bad as what we noted last time he was here in the clinic. The good news is I do think that this should hopefully heal much more rapidly than what we noted last time. His past medical history has not changed he is on Coumadin. 07-03-2022 upon evaluation today patient appears to be doing better in regard to his heel. We will start to see some improvement here which is good news. Fortunately I do not see any evidence of infection locally or systemically at this time which is great news. No fevers, chills, nausea, vomiting, or diarrhea.  07-10-2022 upon evaluation today patient appears to be doing well currently in regard to his wound from the callus standpoint around the edges. Unfortunately from an actual wound bed standpoint he has some deep tissue injury noted at this point. Again I am not sure exactly what is going on that is causing this pressure to the region but he is definitely gotten pressure that is occurring and causing this to not heal as effectively as what we would  like to see. I CHANNIN, LENNARTZ ([id number]) [id number] Page 8 of 10 with him today that he may need to at night when he sleeping use a pillow up underneath his calf in order to prevent the heel from touching the bed at all. I also think that it may be beneficial for him to monitor throughout his day and make sure there is no other time when he is getting the pressure to the heel. 07-23-2022 upon evaluation today patient appears to be doing poorly currently in regard to his heel ulcer. He has been tolerating the dressing changes without complication. Unfortunately I feel like he may be infected based on what I am seeing. 07-28-2022 I did review patient's culture results which showed positive for Proteus as well as Staphylococcus both of which would be treated with the Bactrim DS that I gave him at the last visit. This is good news and hopefully means that we should be able to apply the cast today as long as the wound itself does not appear to be doing poorly. Patient's wound bed actually showed signs of doing quite well and I am very pleased with where things stand and I do not see any signs of worsening overall which is great news. 08-04-2022 upon evaluation today patient appears to be doing well currently in regard to his heel ulcer. I am actually very pleased with where things stand and I do think this is looking significantly better. With regard to his right shin that is also showing signs of improvement which is great news. 08-10-2022 upon evaluation today patient's wound on the heel actually appears to be doing significantly better. In regard to the right anterior shin this is showing signs of healing it might even be completely healed but I still get a monitor I cannot get anything to fill away but also could not see where there was anything actually draining at this point. I am tending to think healed but I want a monitor 1 more week before I call it for  sure. 08-17-2022 upon evaluation today patient appears to be doing well currently in regard to his heel. Fortunately he is tolerating the dressing changes without complication. I do not see any signs of infection and overall I think he is making good progress here. We did get approval for the Apligraf but I think he had a $295 co-pay that would have to be paid per application. With that being said as good as he is doing right now I do not think that is necessary but is still an option depending on how things progress if we need to speed this up quite a bit. 08/24/2022; this patient has a wound on his left heel in the setting of type 2 diabetes. We have been using Prisma. He has a heel offloading boot 08-31-2022 upon evaluation today patient appears to be doing poorly in regard to his heel ulcer which is still showing signs of bruising I am just not as happy as I was when I saw him 2  weeks ago with this. He saw Dr. Leanord Hawking last week. Also did not look good at that point apparently. Nonetheless I think that the patient is still continuing to have some counterpressure getting to the wound bed unfortunately. 09-07-2022 upon evaluation today patient appears to be doing well currently in regard to his heel ulcer. He has been tolerating the dressing changes without complication. Fortunately there does not appear to be any signs of active infection locally or systemically at this time. Fortunately I do not see any evidence of active infection at this time. 09-15-2022 upon evaluation today patient appears to be doing well currently in regard to his legs which in fact appear to be completely healed this is great news. With that being said his heel does have some erythema and warmth as well as drainage I am concerned about cellulitis at this location. 09-21-2022 upon evaluation today patient's wound is actually showing signs of being a little bit smaller but still we are not doing nearly as well as what I  would like to see. Fortunately I do not see any signs of infection at this time which is great news and overall I am extremely pleased in that regard. With that being said I do think that he needs to continue to elevate his leg although the edema is under great control with a compression wrap I think switching to a different dressing topically may be beneficial. My suggestion is to switch him over to a Iodoflex dressing. 09-28-2022 upon evaluation today patient appears to be doing well with regard to his heel ulcer. This is actually showing signs of improvement which is great news and overall I am extremely pleased with where we stand today. 10-06-2022 upon evaluation today patient actually appears to be making excellent progress at this point. Fortunately there does not appear to be any signs of active infection locally nor systemically which is great news and overall I am extremely pleased with where we stand. I do feel like he is actually making some pretty good progress here as we switch to the wrap along with the Iodoflex. 12/26; patient has a wound on the tip of his left heel. This is not a weightbearing surface. He is using a heel offloading boot and carefully offloading this at other times during the day. He is using Iodoflex and Zetuvitunder 3 layer compression 10-22-2022 upon evaluation today patient appears to be doing well currently in regard to his wound. He has been tolerating the dressing changes without complication. Fortunately there does not appear to be any signs of active infection locally nor systemically at this time. No fevers, chills, nausea, vomiting, or diarrhea. 08-30-2023 upon evaluation today patient appears to be doing well currently in regard to his heel ulcer which is showing signs of good improvement. Fortunately I do not see any evidence of infection locally nor systemically which is great news and overall I am extremely pleased with where we stand. No fevers,  chills, nausea, vomiting, or diarrhea. Unfortunately he does have a wound on the side of his fifth toe where I think this did rub on the shoe. 11-05-2022 upon evaluation today patient still still showing some signs of pressure at this point and there may have even been some fluid collection at this time. Fortunately I do not see any evidence of active infection locally nor systemically which is great news and overall I am extremely pleased with where we stand. No fevers, chills, nausea, vomiting, or diarrhea. 1/25; the patient's area on the  dorsal aspect of the left fifth toe is healed. We have been using Iodoflex to the area on the left heel. The lateral wound is slightly down in length. 11-19-2022 upon evaluation today patient appears to be doing well currently in regard to his wound. He has been tolerating the dressing changes without complication. Fortunately there does not appear to be any signs of infection we been using Iodoflex up to this point. 11-26-2022 upon evaluation today patient appears to be doing poorly currently in regard to his wound. He has been tolerating the dressing changes with the Iodoflex without complication. With that being said he does not have any signs of active infection systemically though locally I do feel like there is some evidence of infection here. 12-03-2022 upon evaluation today patient appears to be doing well currently in regard to his wound this is better with the Bactrim compared to last week still there was some significant slough and biofilm buildup which is going require sharp debridement at this point. Fortunately I do not see any signs of active infection locally nor systemically which is great news. 12-11-2022 upon evaluation today patient actually appears to be doing significantly better at this time in regard to his heel. I am actually very pleased with where things stand currently. There is no signs of active infection locally or systemically at this  point. 12-17-2022 upon evaluation today patient appears to be doing better as far as the overall appearance of his wound. I am actually very pleased in that regard. Fortunately there does not appear to be any signs of active infection locally nor systemically at this time. I do feel like that the patient is showing signs of excellent improvement in general. Nonetheless he needs something better for compression I think he should go ahead and see about getting compression socks. 3/7; tip of the left heel measurements are smaller he has been using Hydrofera Blue 01-07-2023 upon evaluation today patient appears to be doing well currently in regard to his wound. This has not been debrided since I last saw him and it definitely shows that definitely needs some debridement today. Fortunately I do not see any evidence of active infection locally nor systemically which is great news. 01-14-2023 upon evaluation today patient appears to be doing well currently although he does have some erythema and warmth to the heel I feel like he may MYCHAL, SARAZIN ([id number]) [id number] Page 9 of 10 have developed an infection yet again. This is something that is been ongoing issue we have been trying to manage his foot best we could. With that being said I do think he may need to go back on the Bactrim this has done well for him in the past. 01-21-2023 upon evaluation today patient appears to be doing better in regard to his heel. He is doing very well with the antibiotics and that is made a big difference for him based on what I am seeing I am very pleased with where we stand today. He is obviously doing extremely well which is great news. 01-28-2023 upon evaluation today patient appears to be doing well currently in regard to his wound. This is actually showing signs of excellent improvement I am actually very pleased with where we stand and I do believe that he is making good progress here.  Fortunately I do not see any evidence of active infection locally nor systemically which is great news. No fevers, chills, nausea, vomiting, or diarrhea. 02-04-2023 upon evaluation today patient's wound is actually  showed signs of excellent improvement I am actually very pleased with where we stand. I do not see any signs of active infection locally nor systemically which is great news and in general I do believe that we are moving in the right direction here. No fevers, chills, nausea, vomiting, or diarrhea. 02-11-2023 upon evaluation patient's wound actually showing signs of excellent improvement I am actually very pleased with where we stand I think that his pain is better there is no signs of infection we will continue to use the gentamicin and overall we are making excellent progress. 02-18-2023 upon evaluation today patient appears to be doing well currently in regard to his wound. He has been tolerating the dressing changes without complication. Fortunately I do not see any signs of active infection locally nor systemically which is great news. 02-25-2023 upon evaluation today patient's heel actually showed signs of excellent improvement. I am actually very pleased with where we stand today and I feel like that he is making really good progress. Fortunately I do not see any signs of active infection which is good news. No fevers, chills, nausea, vomiting, or diarrhea. Objective Constitutional Well-nourished and well-hydrated in no acute distress. Vitals Time Taken: 10:48 AM, Height: 70 in, Weight: 265 lbs, BMI: 38, Temperature: 96.8 F, Pulse: 68 bpm, Respiratory Rate: 18 breaths/min, Blood Pressure: 146/85 mmHg. Respiratory normal breathing without difficulty. Psychiatric this patient is able to make decisions and demonstrates good insight into disease process. Alert and Oriented x 3. pleasant and cooperative. General Notes: Upon inspection patient's wound again showed signs of good  granulation epithelization at this point. Fortunately I do not see any signs of infection. No need for sharp debridement was noted today. Integumentary (Hair, Skin) Wound #10 status is Open. Original cause of wound was Gradually Appeared. The date acquired was: 06/05/2022. The wound has been in treatment 35 weeks. The wound is located on the Left Calcaneus. The wound measures 1.2cm length x 2.5cm width x 0.1cm depth; 2.356cm^2 area and 0.236cm^3 volume. There is Fat Layer (Subcutaneous Tissue) exposed. There is a medium amount of serosanguineous drainage noted. The wound margin is flat and intact. There is small (1-33%) pink granulation within the wound bed. There is a large (67-100%) amount of necrotic tissue within the wound bed including Adherent Slough. 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 Bathing/ Shower/ Hygiene: May shower; gently cleanse wound with antibacterial soap, rinse and pat dry prior to dressing wounds Non-Wound Condition: Additional non-wound orders/instructions: - Apply 40% Urea cream to dry cracked skin on feet. Off-Loading: Open toe surgical shoe with peg assist. WOUND #10: - Calcaneus Wound Laterality: Left Topical: Gentamicin 3 x Per Week/30 Days Discharge Instructions: Apply as directed by provider. DEVARIO, TISON ([id number]) [id number] Page 10 of 10 Prim Dressing: Hydrofera Blue Ready Transfer Foam, 2.5x2.5 (in/in) (Dispense As Written) 3 x Per Week/30 Days ary Discharge Instructions: Apply Hydrofera Blue Ready to wound bed as directed Secondary Dressing: ABD Pad 5x9 (in/in) (Generic) 3 x Per Week/30 Days Discharge Instructions: Cover with ABD pad Secured With: Kerlix Roll Sterile  or Non-Sterile 6-ply 4.5x4 (yd/yd) 3 x Per Week/30 Days Discharge Instructions: Apply Kerlix as directed Secured With: Tubigrip Size D, 3x10 (in/yd) 3 x Per Week/30 Days 1. Would recommend currently based on what we are  seeing that we have the patient going to continue to monitor for any signs of infection or worsening. Based on what I see I do feel like that we are making some really good progress here. 2. I am also can recommend the patient should continue to utilize the [facility] which I think is doing quite well. We will see patient back for reevaluation in 1 week here in the clinic. If anything worsens or changes patient will contact our office for additional recommendations. Electronic Signature(s) Signed: 02/25/2023 12:58:45 PM By: [name] Derry PA-C Entered By: [name] Derry on 02/25/2023 12:58:45 -------------------------------------------------------------------------------- SuperBill Details Patient Name: Date of Service: [name], [name] 02/25/2023 Medical Record Number: [id number] Patient Account Number: [id number] Date of Birth/Sex: Treating RN: [birth year] (77 y.o. [name] Primary Care Provider: [name] Other Clinician: Betha Loa Referring Provider: Treating Provider/Extender: Hermine Messick Weeks in Treatment: 35 Diagnosis Coding ICD-10 Codes Code Description E11.621 Type 2 diabetes mellitus with foot ulcer 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sed E11.42 Type 2 diabetes mellitus with diabetic polyneuropathy Facility Procedures : CPT4 Code: [id number] Description: 99213 - WOUND CARE VISIT-LEV 3 EST PT Modifier: Quantity: 1 Physician Procedures : CPT4 Code Description Modifier [id number] 99213 - WC PHYS LEVEL 3 - EST PT ICD-10 Diagnosis Description E11.621 Type 2 diabetes mellitus with foot ulcer 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sed E11.42 Type 2   diabetes mellitus with diabetic polyneuropathy Quantity: 1 Electronic Signature(s) Signed: 02/25/2023 1:05:20 PM By: [name] Derry PA-C Entered By: [name] Derry on 02/25/2023 13:05:20

## 2023-02-26 NOTE — Progress Notes (Signed)
[name], [name] ([id number]) [id number] Page 1 of 8 Visit Report for 02/25/2023 Arrival Information Details Patient Name: Date of Service: [name], [name] J. 02/25/2023 10:45 A M Medical Record Number: [id number] Patient Account Number: [id number] Date of Birth/Sex: Treating RN: July 22, 1946 (77 y.o. [name] Primary Care Rayden Scheper: [name] Other Clinician: Betha Loa Referring Sarvesh Meddaugh: Treating Valorie Mcgrory/Extender: [name] in Treatment: 35 Visit Information History Since Last Visit All ordered tests and consults were completed: No Patient Arrived: [name] Added or deleted any medications: No Arrival Time: 10:46 Any new allergies or adverse reactions: No Transfer Assistance: None Had a fall or experienced change in No Patient Identification Verified: Yes activities of daily living that may affect Secondary Verification Process Completed: Yes risk of falls: Patient Requires Transmission-Based Precautions: No Signs or symptoms of abuse/neglect since last visito No Patient Has Alerts: Yes Hospitalized since last visit: No Patient Alerts: Patient on Blood Thinner Implantable device outside of the clinic excluding No Warfarin cellular tissue based products placed in the center Type II Diabetic since last visit: Has Dressing in Place as Prescribed: Yes Has Compression in Place as Prescribed: Yes Pain Present Now: No Electronic Signature(s) Signed: 02/26/2023 12:57:35 PM By: Betha Loa Entered By: Betha Loa on 02/25/2023 10:48:11 -------------------------------------------------------------------------------- Clinic Level of Care Assessment Details Patient Name: Date of Service: [name] [facility] J. 02/25/2023 10:45 A M Medical Record Number: [id number] Patient Account Number: [id number] Date of Birth/Sex: Treating RN: Sep 19, 1946 (77 y.o. [name] Primary Care Maryana Pittmon: [name] Other  Clinician: Betha Loa Referring Leliana Kontz: Treating Quantay Zaremba/Extender: [name] in Treatment: 35 Clinic Level of Care Assessment Items TOOL 4 Quantity Score []  - 0 Use when only an EandM is performed on FOLLOW-UP visit ASSESSMENTS - Nursing Assessment / Reassessment X- 1 10 Reassessment of Co-morbidities (includes updates in patient status) X- 1 5 Reassessment of Adherence to Treatment Plan ASSESSMENTS - Wound and Skin A ssessment / Reassessment X - Simple Wound Assessment / Reassessment - one wound 1 5 []  - 0 Complex Wound Assessment / Reassessment - multiple wounds []  - 0 Dermatologic / Skin Assessment (not related to wound area) ASSESSMENTS - Focused Assessment []  - 0 Circumferential Edema Measurements - multi extremities []  - 0 Nutritional Assessment / Counseling / Intervention []  - 0 Lower Extremity Assessment (monofilament, tuning fork, pulses) []  - 0 Peripheral Arterial Disease Assessment (using hand held doppler) ASSESSMENTS - Ostomy and/or Continence Assessment and Care []  - 0 Incontinence Assessment and Management []  - 0 Ostomy Care Assessment and Management (repouching, etc.) YUMA, SHIMP ([id number]) [id number] Page 2 of 8 PROCESS - Coordination of Care X - Simple Patient / Family Education for ongoing care 1 15 []  - 0 Complex (extensive) Patient / Family Education for ongoing care []  - 0 Staff obtains Chiropractor, Records, T Results / Process Orders est []  - 0 Staff telephones HHA, Nursing Homes / Clarify orders / etc []  - 0 Routine Transfer to another Facility (non-emergent condition) []  - 0 Routine [name] Admission (non-emergent condition) []  - 0 New Admissions / Manufacturing engineer / Ordering NPWT Apligraf, etc. , []  - 0 Emergency [name] Admission (emergent condition) X- 1 10 Simple Discharge Coordination []  - 0 Complex (extensive) Discharge Coordination PROCESS - Special  Needs []  - 0 Pediatric / Minor Patient Management []  - 0 Isolation Patient Management []  - 0 Hearing / Language / Visual special needs []  - 0 Assessment of Community assistance (transportation, D/C planning, etc.) []  -  0 Additional assistance / Altered mentation []  - 0 Support Surface(s) Assessment (bed, cushion, seat, etc.) INTERVENTIONS - Wound Cleansing / Measurement X - Simple Wound Cleansing - one wound 1 5 []  - 0 Complex Wound Cleansing - multiple wounds X- 1 5 Wound Imaging (photographs - any number of wounds) []  - 0 Wound Tracing (instead of photographs) X- 1 5 Simple Wound Measurement - one wound []  - 0 Complex Wound Measurement - multiple wounds INTERVENTIONS - Wound Dressings []  - 0 Small Wound Dressing one or multiple wounds X- 1 15 Medium Wound Dressing one or multiple wounds []  - 0 Large Wound Dressing one or multiple wounds []  - 0 Application of Medications - topical []  - 0 Application of Medications - injection INTERVENTIONS - Miscellaneous []  - 0 External ear exam []  - 0 Specimen Collection (cultures, biopsies, blood, body fluids, etc.) []  - 0 Specimen(s) / Culture(s) sent or taken to Lab for analysis []  - 0 Patient Transfer (multiple staff / Nurse, adult / Similar devices) []  - 0 Simple Staple / Suture removal (25 or less) []  - 0 Complex Staple / Suture removal (26 or more) []  - 0 Hypo / Hyperglycemic Management (close monitor of Blood Glucose) []  - 0 Ankle / Brachial Index (ABI) - do not check if billed separately X- 1 5 Vital Signs Has the patient been seen at the [name] within the last three years: Yes Total Score: 80 Level Of Care: New/Established - Level 3 Electronic Signature(s) Signed: 02/26/2023 12:57:35 PM By: Lawana Pai [id number] [email or web address] Page 3 of 8 Signed: 02/26/2023 12:57:35 Entered By: Betha Loa on 02/25/2023  11:17:38 -------------------------------------------------------------------------------- Encounter Discharge Information Details Patient Name: Date of Service: [name], [name] J. 02/25/2023 10:45 A M Medical Record Number: [id number] Patient Account Number: [id number] Date of Birth/Sex: Treating RN: October 17, 1946 (77 y.o. [name] Primary Care Izamar Linden: [name] Other Clinician: Betha Loa Referring Luther Newhouse: Treating Patric Buckhalter/Extender: [name] in Treatment: 35 Encounter Discharge Information Items Discharge Condition: Stable Ambulatory Status: Walker Discharge Destination: Home Transportation: Private Auto Accompanied By: self Schedule Follow-up Appointment: Yes Clinical Summary of Care: Electronic Signature(s) Signed: 02/26/2023 12:57:35 PM By: Betha Loa Entered By: Betha Loa on 02/25/2023 11:32:09 -------------------------------------------------------------------------------- Lower Extremity Assessment Details Patient Name: Date of Service: [name], [name] [name] 02/25/2023 10:45 A M Medical Record Number: [id number] Patient Account Number: [id number] Date of Birth/Sex: Treating RN: 24-Jul-1946 (77 y.o. [name] Primary Care Phil Michels: [name] Other Clinician: Betha Loa Referring Lorrain Rivers: Treating Chaselyn Nanney/Extender: Hermine Messick Weeks in Treatment: 35 Edema Assessment Assessed: [Left: Yes] [Right: No] Edema: [Left: Ye] [Right: s] Calf Left: Right: Point of Measurement: 33 cm From Medial Instep 41.4 cm Ankle Left: Right: Point of Measurement: 11 cm From Medial Instep 23.4 cm Vascular Assessment Pulses: Dorsalis Pedis Palpable: [Left:Yes] Electronic Signature(s) Signed: 02/25/2023 4:04:22 PM By: Angelina Pih Signed: 02/26/2023 12:57:35 PM By: Betha Loa Entered By: Betha Loa on 02/25/2023  11:03:25 -------------------------------------------------------------------------------- Multi Wound Chart Details Patient Name: Date of Service: [name], [name] [name] 02/25/2023 10:45 A M Medical Record Number: [id number] Patient Account Number: [id number] Date of Birth/Sex: Treating RN: 09/15/1946 (77 y.o. [place], [place], [place] J ([id number]) [phone].pdf Page 4 of 8 Primary Care Flora Parks: [name] Other Clinician: Betha Loa Referring Damond Borchers: Treating Romina Divirgilio/Extender: [name] in Treatment: 35 Vital Signs Height(in): 70 Pulse(bpm): 68 Weight(lbs): 265 Blood Pressure(mmHg): 146/85 Body Mass Index(BMI): 38 Temperature(F): 96.8 Respiratory  Rate(breaths/min): 18 [10:Photos:] [N/A:N/A] Left Calcaneus N/A N/A Wound Location: Gradually Appeared N/A N/A Wounding 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 N/A N/A Secondary Etiology: Cataracts, Arrhythmia, Coronary N/A N/A Comorbid History: Artery Disease, Hypertension, Type II Diabetes, Osteoarthritis, Neuropathy 06/05/2022 N/A N/A Date Acquired: 35 N/A N/A Weeks of Treatment: Open N/A N/A Wound Status: No N/A N/A Wound Recurrence: 1.2x2.5x0.1 N/A N/A Measurements L x W x D (cm) 2.356 N/A N/A A (cm) : rea 0.236 N/A N/A Volume (cm) : -66.60% N/A N/A % Reduction in A rea: 16.60% N/A N/A % Reduction in Volume: Grade 1 N/A N/A Classification: Medium N/A N/A Exudate A mount: Serosanguineous N/A N/A Exudate Type: red, brown N/A N/A Exudate Color: Flat and Intact N/A N/A Wound Margin: Small (1-33%) N/A N/A Granulation A mount: Pink N/A N/A Granulation Quality: Large (67-100%) N/A N/A Necrotic A mount: Fat Layer (Subcutaneous Tissue): Yes N/A N/A Exposed Structures: Fascia: No Tendon: No Muscle: No Joint: No Bone: No Small (1-33%) N/A N/A Epithelialization: Treatment  Notes Electronic Signature(s) Signed: 02/26/2023 12:57:35 PM By: Betha Loa Entered By: Betha Loa on 02/25/2023 11:03:37 -------------------------------------------------------------------------------- Multi-Disciplinary Care Plan Details Patient Name: Date of Service: [name], [name] [name] 02/25/2023 10:45 A M Medical Record Number: [id number] Patient Account Number: [id number] Date of Birth/Sex: Treating RN: 1946-05-17 (77 y.o. [name] Primary Care Alban Marucci: [name] Other Clinician: Betha Loa Referring Roben Tatsch: Treating Deland Slocumb/Extender: [name] in Treatment: [address] YERALDO, DORFF ([id number]) [id number] Page 5 of 8 Wound/Skin Impairment Nursing Diagnoses: Impaired tissue integrity Goals: Patient/caregiver will verbalize understanding of skin care regimen Date Initiated: 06/19/2022 Date Inactivated: 07/10/2022 Target Resolution Date: 06/19/2022 Goal Status: Met Ulcer/skin breakdown will have a volume reduction of 30% by week 4 Date Initiated: 06/19/2022 Date Inactivated: 07/28/2022 Target Resolution Date: 07/17/2022 Goal Status: Met Ulcer/skin breakdown will have a volume reduction of 50% by week 8 Date Initiated: 07/28/2022 Target Resolution Date: 03/20/2023 Goal Status: Active Interventions: Assess patient/caregiver ability to obtain necessary supplies Assess patient/caregiver ability to perform ulcer/skin care regimen upon admission and as needed Assess ulceration(s) every visit Provide education on ulcer and skin care Treatment Activities: Skin care regimen initiated : 06/19/2022 Topical wound management initiated : 06/19/2022 Notes: Electronic Signature(s) Signed: 02/25/2023 4:04:22 PM By: Angelina Pih Signed: 02/26/2023 12:57:35 PM By: Betha Loa Entered By: Betha Loa on 02/25/2023  11:17:52 -------------------------------------------------------------------------------- Pain Assessment Details Patient Name: Date of Service: [name], [name] [name] 02/25/2023 10:45 A M Medical Record Number: [id number] Patient Account Number: [id number] Date of Birth/Sex: Treating RN: June 02, 1946 (77 y.o. [name] Primary Care Daquawn Seelman: [name] Other Clinician: Betha Loa Referring Carmencita Cusic: Treating Ersel Enslin/Extender: Hermine Messick Weeks in Treatment: 35 Active Problems Location of Pain Severity and Description of Pain Patient Has Paino No Site Locations Pain Management and Medication Current Pain Management: Electronic Signature(s) Signed: 02/25/2023 4:04:22 PM By: Burman Riis ([id number]) [id number] Page 6 of 8 Signed: 02/26/2023 12:57:35 PM By: Betha Loa Entered By: Betha Loa on 02/25/2023 10:52:18 -------------------------------------------------------------------------------- Patient/Caregiver Education Details Patient Name: Date of Service: [name], [name] [name] 5/9/2024andnbsp10:45 A M Medical Record Number: [id number] Patient Account Number: [id number] Date of Birth/Gender: Treating RN: 1946-07-26 (77 y.o. [name] Primary Care Physician: [name] Other Clinician: Betha Loa Referring Physician: Treating Physician/Extender: [name] in Treatment: 35 Education Assessment Education Provided To: Patient Education Topics Provided Wound/Skin Impairment: Handouts: Other: continue wound care  as directed Methods: Explain/Verbal Responses: State content correctly Electronic Signature(s) Signed: 02/26/2023 12:57:35 PM By: Betha Loa Entered By: Betha Loa on 02/25/2023 11:18:10 -------------------------------------------------------------------------------- Wound Assessment Details Patient Name: Date of Service: [name], [name] [name]  J. 02/25/2023 10:45 A M Medical Record Number: [id number] Patient Account Number: [id number] Date of Birth/Sex: Treating RN: 09/04/1946 (77 y.o. [name] Primary Care Hilberto Burzynski: [name] Other Clinician: Betha Loa Referring Iker Nuttall: Treating Jamaiyah Pyle/Extender: Hermine Messick Weeks in Treatment: 35 Wound Status Wound Number: 10 Primary Diabetic Wound/Ulcer of the Lower Extremity Etiology: Wound Location: Left Calcaneus Secondary Pressure Ulcer Wounding Event: Gradually Appeared Etiology: Date Acquired: 06/05/2022 Wound Open Weeks Of Treatment: 35 Status: Clustered Wound: No Comorbid Cataracts, Arrhythmia, Coronary Artery Disease, Hypertension, History: Type II Diabetes, Osteoarthritis, Neuropathy Photos Wound Measurements Length: (cm) 1.2 Width: (cm) 2.5 Depth: (cm) 0.1 Area: (cm) 2.3 ARTRELL, MATTO ([id number]) Volume: (cm) % Reduction in Area: -66.6% % Reduction in Volume: 16.6% Epithelialization: Small (1-33%) 56 [id number] Page 7 of 8 0.236 Wound Description Classification: Grade 1 Wound Margin: Flat and Intact Exudate Amount: Medium Exudate Type: Serosanguineous Exudate Color: red, brown Foul Odor After Cleansing: No Slough/Fibrino Yes Wound Bed Granulation Amount: Small (1-33%) Exposed Structure Granulation Quality: Pink Fascia Exposed: No Necrotic Amount: Large (67-100%) Fat Layer (Subcutaneous Tissue) Exposed: Yes Necrotic Quality: Adherent Slough Tendon Exposed: No Muscle Exposed: No Joint Exposed: No Bone Exposed: No Treatment Notes Wound #10 (Calcaneus) Wound Laterality: Left Cleanser Peri-Wound Care Topical Gentamicin Discharge Instruction: Apply as directed by Damarcus Reggio. Primary Dressing Hydrofera Blue Ready Transfer Foam, 2.5x2.5 (in/in) Discharge Instruction: Apply Hydrofera Blue Ready to wound bed as directed Secondary Dressing ABD Pad 5x9 (in/in) Discharge Instruction:  Cover with ABD pad Secured With Kerlix Roll Sterile or Non-Sterile 6-ply 4.5x4 (yd/yd) Discharge Instruction: Apply Kerlix as directed Tubigrip Size D, 3x10 (in/yd) Compression Wrap Compression Stockings Add-Ons Electronic Signature(s) Signed: 02/25/2023 4:04:22 PM By: Angelina Pih Signed: 02/26/2023 12:57:35 PM By: Betha Loa Entered By: Betha Loa on 02/25/2023 11:02:06 -------------------------------------------------------------------------------- Vitals Details Patient Name: Date of Service: [name], [name] [name] 02/25/2023 10:45 A M Medical Record Number: [id number] Patient Account Number: [id number] Date of Birth/Sex: Treating RN: July 27, 1946 (77 y.o. [name] Primary Care Geofrey Silliman: [name] Other Clinician: Betha Loa Referring Marquese Burkland: Treating Mcdaniel Ohms/Extender: Hermine Messick Weeks in Treatment: 35 Vital Signs Time Taken: 10:48 Temperature (F): 96.8 Height (in): 70 Pulse (bpm): 68 Weight (lbs): 265 Respiratory Rate (breaths/min): 18 Body Mass Index (BMI): 38 Blood Pressure (mmHg): 146/85 Reference Range: 80 - 120 mg / dl RODGERICK, ISAAK ([id number]) [id number] Page 8 of 8 Electronic Signature(s) Signed: 02/26/2023 12:57:35 PM By: Betha Loa Entered By: Betha Loa on 02/25/2023 10:52:12

## 2023-03-04 ENCOUNTER — Encounter: Payer: Medicare HMO | Admitting: Physician Assistant

## 2023-03-04 DIAGNOSIS — E11621 Type 2 diabetes mellitus with foot ulcer: Secondary | ICD-10-CM | POA: Diagnosis not present

## 2023-03-11 ENCOUNTER — Encounter: Payer: Medicare HMO | Admitting: Physician Assistant

## 2023-03-11 DIAGNOSIS — E11621 Type 2 diabetes mellitus with foot ulcer: Secondary | ICD-10-CM | POA: Diagnosis not present

## 2023-03-17 ENCOUNTER — Encounter: Payer: Self-pay | Admitting: Neurology

## 2023-03-18 ENCOUNTER — Encounter: Payer: Medicare HMO | Admitting: Physician Assistant

## 2023-03-18 ENCOUNTER — Other Ambulatory Visit: Payer: Self-pay

## 2023-03-18 DIAGNOSIS — E11621 Type 2 diabetes mellitus with foot ulcer: Secondary | ICD-10-CM | POA: Diagnosis not present

## 2023-03-18 MED ORDER — PREGABALIN 150 MG PO CAPS: ORAL_CAPSULE | ORAL | 1 refills | Status: DC

## 2023-03-18 NOTE — Telephone Encounter (Signed)
Requested Prescriptions   Pending Prescriptions Disp Refills   pregabalin (LYRICA) 150 MG capsule 360 capsule 1    Sig: Take 1 capsule (150 mg total) by mouth daily AND 3 capsules (450 mg total) every evening.   Last seen 02/16/23, next appt scheduled 08/23/23 Increase Pregabalin to 450 mg at bedtime this was new dosing after last visit so he needed updated rx which I changed in this encounter Dispenses   Dispensed Days Supply Quantity Provider Pharmacy  PREGABALIN 150 MG CAPSULE 11/22/2022 90 270 each Windell Norfolk, MD [facility] [phone] - M...  PREGABALIN 150 MG CAPSULE 08/18/2022 90 270 each Windell Norfolk, MD [facility] [phone] - M...  PREGABALIN 150MG  CAP 07/22/2022 30 60 capsule Windell Norfolk, MD [facility] [phone] ...  PREGABALIN 150MG  CAP 06/10/2022 30 60 each Windell Norfolk, MD [facility] [phone] ...  PREGABALIN 150MG  CAP 05/14/2022 30 60 each Windell Norfolk, MD [facility] [phone] ...  PREGABALIN 150MG  CAP 04/13/2022 30 60 each Windell Norfolk, MD [facility] [phone] .Marland KitchenMarland Kitchen

## 2023-03-19 ENCOUNTER — Other Ambulatory Visit: Payer: Self-pay | Admitting: Pulmonary Disease

## 2023-03-19 DIAGNOSIS — J849 Interstitial pulmonary disease, unspecified: Secondary | ICD-10-CM

## 2023-03-19 DIAGNOSIS — R609 Edema, unspecified: Secondary | ICD-10-CM

## 2023-03-23 NOTE — Telephone Encounter (Signed)
Received and completed additional info for PA for lyrica, completed and faxed back to [facility]

## 2023-03-25 ENCOUNTER — Encounter: Payer: Medicare HMO | Attending: Physician Assistant | Admitting: Physician Assistant

## 2023-03-25 DIAGNOSIS — Z7901 Long term (current) use of anticoagulants: Secondary | ICD-10-CM | POA: Diagnosis not present

## 2023-03-25 DIAGNOSIS — L97522 Non-pressure chronic ulcer of other part of left foot with fat layer exposed: Secondary | ICD-10-CM | POA: Diagnosis not present

## 2023-03-25 DIAGNOSIS — E1142 Type 2 diabetes mellitus with diabetic polyneuropathy: Secondary | ICD-10-CM | POA: Diagnosis not present

## 2023-03-25 DIAGNOSIS — E11621 Type 2 diabetes mellitus with foot ulcer: Secondary | ICD-10-CM | POA: Diagnosis present

## 2023-03-26 ENCOUNTER — Other Ambulatory Visit: Payer: Medicare HMO

## 2023-03-26 ENCOUNTER — Ambulatory Visit
Admission: RE | Admit: 2023-03-26 | Discharge: 2023-03-26 | Disposition: A | Discharge status: Home / Self Care | Payer: Medicare HMO | Source: Ambulatory Visit | Attending: Pulmonary Disease | Admitting: Pulmonary Disease

## 2023-03-26 DIAGNOSIS — J849 Interstitial pulmonary disease, unspecified: Secondary | ICD-10-CM | POA: Diagnosis present

## 2023-03-26 DIAGNOSIS — R609 Edema, unspecified: Secondary | ICD-10-CM | POA: Insufficient documentation

## 2023-04-02 ENCOUNTER — Ambulatory Visit: Payer: Medicare HMO

## 2023-04-05 ENCOUNTER — Encounter: Payer: Medicare HMO | Admitting: Physician Assistant

## 2023-04-05 DIAGNOSIS — E11621 Type 2 diabetes mellitus with foot ulcer: Secondary | ICD-10-CM | POA: Diagnosis not present

## 2023-04-05 NOTE — Progress Notes (Signed)
DAVONNE, DEHNER ([id number]) [id number] Page 1 of 2 Visit Report for 04/05/2023 Chief Complaint Document Details Patient Name: Date of Service: [name], GEO [facility] J. 04/05/2023 1:45 PM Medical Record Number: [id number] Patient Account Number: [id number] Date of Birth/Sex: Treating RN: 04/30/1946 (76 y.o. [name]) [name] Primary Care Provider: [name] Other Clinician: Betha Loa Referring Provider: Treating Provider/Extender: [name] in Treatment: 67 Information Obtained from: Patient Chief Complaint Left heel ulcer Electronic Signature(s) Signed: 04/05/2023 2:13:18 PM By: [name] Derry PA-C Entered By: [name] Derry on 04/05/2023 14:13:18 -------------------------------------------------------------------------------- Problem List Details Patient Name: Date of Service: [name], [name] 04/05/2023 1:45 PM Medical Record Number: [id number] Patient Account Number: [id number] Date of Birth/Sex: Treating RN: [birth year] (76 y.o. [name] Primary Care Provider: [name] Other Clinician: Betha Loa Referring Provider: Treating Provider/Extender: [name] in Treatment: 24 Active Problems ICD-10 Encounter Code Description Active Date MDM Diagnosis E11.621 Type 2 diabetes mellitus with foot ulcer 06/19/2022 No Yes L97.522 Non-pressure chronic ulcer of other part of left foot with fat 06/19/2022 No Yes layer exposed E11.42 Type 2 diabetes mellitus with diabetic polyneuropathy 06/19/2022 No Yes IRIC, BEYMER ([id number]) [id number] Page 2 of 2 Inactive Problems Resolved Problems Electronic Signature(s) Signed: 04/05/2023 2:13:11 PM By: [name] Derry PA-C Entered By: [name] Derry on 04/05/2023 14:13:10

## 2023-04-08 ENCOUNTER — Ambulatory Visit: Payer: Medicare HMO | Admitting: Physician Assistant

## 2023-04-08 NOTE — Progress Notes (Signed)
[name] ([id number]) [id number] Page 1 of 9 Visit Report for 04/05/2023 Arrival Information Details Patient Name: Date of Service: [name], GEO [facility] J. 04/05/2023 1:45 PM Medical Record Number: [id number] Patient Account Number: [id number] Date of Birth/Sex: Treating RN: 04-21-1946 (77 y.o. [name]) [name] Primary Care Diana Armijo: [name] Other Clinician: Betha Loa Referring Andren Bethea: Treating Nishan Ovens/Extender: [name] in Treatment: 83 Visit Information History Since Last Visit All ordered tests and consults were completed: No Patient Arrived: [name] Added or deleted any medications: No Arrival Time: 14:00 Any new allergies or adverse reactions: No Transfer Assistance: None Had a fall or experienced change in No Patient Identification Verified: Yes activities of daily living that may affect Secondary Verification Process Completed: Yes risk of falls: Patient Requires Transmission-Based Precautions: No Signs or symptoms of abuse/neglect since last visito No Patient Has Alerts: Yes Hospitalized since last visit: No Patient Alerts: Patient on Blood Thinner Implantable device outside of the clinic excluding No Warfarin cellular tissue based products placed in the [name] Type II Diabetic since last visit: Has Dressing in Place as Prescribed: Yes Has Compression in Place as Prescribed: Yes Pain Present Now: No Electronic Signature(s) Signed: 04/06/2023 8:00:35 AM By: Betha Loa Entered By: Betha Loa on 04/05/2023 14:00:46 -------------------------------------------------------------------------------- Clinic Level of Care Assessment Details Patient Name: Date of Service: [name] J. 04/05/2023 1:45 PM Medical Record Number: [id number] Patient Account Number: [id number] Date of Birth/Sex: Treating RN: [birth year] (77 y.o. [name]) [name] Primary Care Vincent Ehrler: [name] Other Clinician:  Betha Loa Referring Keonia Pasko: Treating Candiss Galeana/Extender: [name] in Treatment: 41 Clinic Level of Care Assessment Items TOOL 1 Quantity Score []  - 0 Use when EandM and Procedure is performed on INITIAL visit ASSESSMENTS - Nursing Assessment / Reassessment []  - 0 General Physical Exam (combine w/ comprehensive assessment (listed just below) when performed on new pt. [address]GARETT, [name] ([id number]) [id number] Page 2 of 9 []  - 0 Comprehensive Assessment (HX, ROS, Risk Assessments, Wounds Hx, etc.) ASSESSMENTS - Wound and Skin Assessment / Reassessment []  - 0 Dermatologic / Skin Assessment (not related to wound area) ASSESSMENTS - Ostomy and/or Continence Assessment and Care []  - 0 Incontinence Assessment and Management []  - 0 Ostomy Care Assessment and Management (repouching, etc.) PROCESS - Coordination of Care []  - 0 Simple Patient / Family Education for ongoing care []  - 0 Complex (extensive) Patient / Family Education for ongoing care []  - 0 Staff obtains Chiropractor, Records, T Results / Process Orders est []  - 0 Staff telephones HHA, Nursing Homes / Clarify orders / etc []  - 0 Routine Transfer to another Facility (non-emergent condition) []  - 0 Routine Hospital Admission (non-emergent condition) []  - 0 New Admissions / Manufacturing engineer / Ordering NPWT Apligraf, etc. , []  - 0 Emergency Hospital Admission (emergent condition) PROCESS - Special Needs []  - 0 Pediatric / Minor Patient Management []  - 0 Isolation Patient Management []  - 0 Hearing / Language / Visual special needs []  - 0 Assessment of Community assistance (transportation, D/C planning, etc.) []  - 0 Additional assistance / Altered mentation []  - 0 Support Surface(s) Assessment (bed, cushion, seat, etc.) INTERVENTIONS - Miscellaneous []  - 0 External ear exam []  - 0 Patient Transfer (multiple staff / Nurse, adult / Similar  devices) []  - 0 Simple Staple / Suture removal (25 or less) []  - 0 Complex Staple / Suture removal (26 or more) []  - 0 Hypo/Hyperglycemic Management (do not check if billed separately) []  -  0 Ankle / Brachial Index (ABI) - do not check if billed separately Has the patient been seen at the hospital within the last three years: Yes Total Score: 0 Level Of Care: ____ Electronic Signature(s) Signed: 04/06/2023 8:00:35 AM By: Betha Loa Entered By: Betha Loa on 04/05/2023 14:37:03 -------------------------------------------------------------------------------- Compression Therapy Details Patient Name: Date of Service: [name], [name] 04/05/2023 1:45 PM Medical Record Number: [id number] Patient Account Number: [id number] Date of Birth/Sex: Treating RN: [birth year] (77 y.o. [name] Primary Care Afra Tricarico: [name] Other Clinician: Gavin, Leatham ([id number]) [id number] Page 3 of 9 Referring Abhijay Morriss: Treating Khyren Hing/Extender: [name] in Treatment: 41 Compression Therapy Performed for Wound Assessment: Wound #10 Left Calcaneus Performed By: Farrel Gordon, Angie, Compression Type: Double Layer Pre Treatment ABI: 1 Post Procedure Diagnosis Same as Pre-procedure Electronic Signature(s) Signed: 04/06/2023 8:00:35 AM By: Betha Loa Entered By: Betha Loa on 04/05/2023 14:35:40 -------------------------------------------------------------------------------- Encounter Discharge Information Details Patient Name: Date of Service: [name], [name] 04/05/2023 1:45 PM Medical Record Number: [id number] Patient Account Number: [id number] Date of Birth/Sex: Treating RN: 1945-11-22 (77 y.o. [name] Primary Care Inika Bellanger: [name] Other Clinician: Betha Loa Referring Danuel Felicetti: Treating Dasean Brow/Extender: [name] in Treatment: 49 Encounter  Discharge Information Items Post Procedure Vitals Discharge Condition: Stable Temperature (F): 98.2 Ambulatory Status: Walker Pulse (bpm): 66 Discharge Destination: Home Respiratory Rate (breaths/min): 18 Transportation: Private Auto Blood Pressure (mmHg): 131/62 Accompanied By: self Schedule Follow-up Appointment: Yes Clinical Summary of Care: Electronic Signature(s) Signed: 04/06/2023 8:00:35 AM By: Betha Loa Entered By: Betha Loa on 04/05/2023 14:52:55 -------------------------------------------------------------------------------- Lower Extremity Assessment Details Patient Name: Date of Service: [name] J. 04/05/2023 1:45 PM Medical Record Number: [id number] Patient Account Number: [id number] Date of Birth/Sex: Treating RN: 08/16/1946 (76 y.o. [name] Primary Care Olukemi Panchal: [name] Other Clinician: Betha Loa Referring Makael Stein: Treating Aldea Avis/Extender: [name] in Treatment: [address] ([id number]) [phone].pdf Page 4 of 9 Edema Assessment Assessed: [Left: Yes] [Right: No] Edema: [Left: Ye] [Right: s] Calf Left: Right: Point of Measurement: 33 cm From Medial Instep 42 cm Ankle Left: Right: Point of Measurement: 11 cm From Medial Instep 22.5 cm Vascular Assessment Pulses: Dorsalis Pedis Palpable: [Left:Yes] Electronic Signature(s) Signed: 04/06/2023 8:00:35 AM By: Betha Loa Signed: 04/08/2023 12:58:51 PM By: [name] RN Entered By: Betha Loa on 04/05/2023 14:18:53 -------------------------------------------------------------------------------- Multi Wound Chart Details Patient Name: Date of Service: [name], [name] 04/05/2023 1:45 PM Medical Record Number: [id number] Patient Account Number: [id number] Date of Birth/Sex: Treating RN: April 29, 1946 (76 y.o. [name] Primary Care Illyria Sobocinski: [name] Other Clinician: Betha Loa Referring  Marili Vader: Treating Tommie Dejoseph/Extender: [name] in Treatment: 41 Vital Signs Height(in): 70 Pulse(bpm): 66 Weight(lbs): 265 Blood Pressure(mmHg): 131/62 Body Mass Index(BMI): 38 Temperature(F): 98.2 Respiratory Rate(breaths/min): 18 [10:Photos:] [N/A:N/A] Left Calcaneus N/A N/A Wound Location: Gradually Appeared N/A N/A Wounding 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 N/A N/A Secondary Etiology: Cataracts, Arrhythmia, Coronary N/A N/A Comorbid History: Artery Disease, Hypertension, Type II Diabetes, Osteoarthritis, Neuropathy JOCSAN, RAUP ([id number]) [id number] Page 5 of 9 06/05/2022 N/A N/A Date Acquired: 52 N/A N/A Weeks of Treatment: Open N/A N/A Wound Status: No N/A N/A Wound Recurrence: 1x2.5x0.2 N/A N/A Measurements L x W x D (cm) 1.963 N/A N/A A (cm) : rea 0.393 N/A N/A Volume (cm) : -38.80% N/A N/A % Reduction in A  rea: -38.90% N/A N/A % Reduction in Volume: Grade 1 N/A N/A Classification: Medium N/A N/A Exudate A mount: Serosanguineous N/A N/A Exudate Type: red, brown N/A N/A Exudate Color: Flat and Intact N/A N/A Wound Margin: Small (1-33%) N/A N/A Granulation A mount: Pink N/A N/A Granulation Quality: Large (67-100%) N/A N/A Necrotic A mount: Fat Layer (Subcutaneous Tissue): Yes N/A N/A Exposed Structures: Fascia: No Tendon: No Muscle: No Joint: No Bone: No Small (1-33%) N/A N/A Epithelialization: Treatment Notes Electronic Signature(s) Signed: 04/06/2023 8:00:35 AM By: Betha Loa Entered By: Betha Loa on 04/05/2023 14:19:02 -------------------------------------------------------------------------------- Multi-Disciplinary Care Plan Details Patient Name: Date of Service: [name], [name] 04/05/2023 1:45 PM Medical Record Number: [id number] Patient Account Number: [id number] Date of Birth/Sex: Treating  RN: 11-26-1945 (76 y.o. [name]) [name] Primary Care Marva Hendryx: [name] Other Clinician: Betha Loa Referring Ailey Wessling: Treating Alethea Terhaar/Extender: [name] in Treatment: 41 Active Inactive Necrotic Tissue Nursing Diagnoses: Impaired tissue integrity related to necrotic/devitalized tissue Knowledge deficit related to management of necrotic/devitalized tissue Goals: Necrotic/devitalized tissue will be minimized in the wound bed Date Initiated: 03/04/2023 Target Resolution Date: 03/18/2023 Goal Status: Active Patient/caregiver will verbalize understanding of reason and process for debridement of necrotic tissue Date Initiated: 03/04/2023 Target Resolution Date: 03/04/2023 Goal Status: Active Interventions: Assess patient pain level pre-, during and post procedure and prior to discharge Provide education on necrotic tissue and debridement process Treatment Activities: Excisional debridement : 03/04/2023 Barrie Folk ([id number]) [phone].pdf Page 6 of 9 Notes: Wound/Skin Impairment Nursing Diagnoses: Impaired tissue integrity Goals: Patient/caregiver will verbalize understanding of skin care regimen Date Initiated: 06/19/2022 Date Inactivated: 07/10/2022 Target Resolution Date: 06/19/2022 Goal Status: Met Ulcer/skin breakdown will have a volume reduction of 30% by week 4 Date Initiated: 06/19/2022 Date Inactivated: 07/28/2022 Target Resolution Date: 07/17/2022 Goal Status: Met Ulcer/skin breakdown will have a volume reduction of 50% by week 8 Date Initiated: 07/28/2022 Target Resolution Date: 03/20/2023 Goal Status: Active Interventions: Assess patient/caregiver ability to obtain necessary supplies Assess patient/caregiver ability to perform ulcer/skin care regimen upon admission and as needed Assess ulceration(s) every visit Provide education on ulcer and skin care Treatment Activities: Skin care regimen initiated :  06/19/2022 Topical wound management initiated : 06/19/2022 Notes: Electronic Signature(s) Signed: 04/06/2023 8:00:35 AM By: Betha Loa Signed: 04/08/2023 12:58:51 PM By: [name] RN Entered By: Betha Loa on 04/05/2023 14:37:28 -------------------------------------------------------------------------------- Pain Assessment Details Patient Name: Date of Service: [name], [name] 04/05/2023 1:45 PM Medical Record Number: [id number] Patient Account Number: [id number] Date of Birth/Sex: Treating RN: [birth year] (76 y.o. [name] Primary Care Dominic Mahaney: [name] Other Clinician: Betha Loa Referring Adebayo Ensminger: Treating Nimai Burbach/Extender: [name] in Treatment: 41 Active Problems Location of Pain Severity and Description of Pain Patient Has Paino No Site Locations FREDERICK, OSCAR ([id number]) [phone].pdf Page 7 of 9 Pain Management and Medication Current Pain Management: Electronic Signature(s) Signed: 04/06/2023 8:00:35 AM By: Betha Loa Signed: 04/08/2023 12:58:51 PM By: [name] RN Entered By: Betha Loa on 04/05/2023 14:04:40 -------------------------------------------------------------------------------- Patient/Caregiver Education Details Patient Name: Date of Service: [name], GEO Cherrie Gauze 6/17/2024andnbsp1:45 PM Medical Record Number: [id number] Patient Account Number: [id number] Date of Birth/Gender: Treating RN: 1946-08-29 (76 y.o. [name] Primary Care Physician: [name] Other Clinician: Betha Loa Referring Physician: Treating Physician/Extender: [name] in Treatment: 55 Education Assessment Education Provided To: Patient Education Topics Provided Wound/Skin Impairment: Handouts: Other: continue wound care as directed Methods: Explain/Verbal Responses: State content correctly  Electronic Signature(s) Signed: 04/06/2023 8:00:35 AM By:  Betha Loa Entered By: Betha Loa on 04/05/2023 14:38:09 Barrie Folk ([id number]) [id number] Page 8 of 9 -------------------------------------------------------------------------------- Wound Assessment Details Patient Name: Date of Service: [name], [name] 04/05/2023 1:45 PM Medical Record Number: [id number] Patient Account Number: [id number] Date of Birth/Sex: Treating RN: August 30, 1946 (76 y.o. [name]) [name] Primary Care Kristena Wilhelmi: [name] Other Clinician: Betha Loa Referring Yovana Scogin: Treating Hydeia Mcatee/Extender: Hermine Messick Weeks in Treatment: 41 Wound Status Wound Number: 10 Primary Diabetic Wound/Ulcer of the Lower Extremity Etiology: Wound Location: Left Calcaneus Secondary Pressure Ulcer Wounding Event: Gradually Appeared Etiology: Date Acquired: 06/05/2022 Wound Open Weeks Of Treatment: 41 Status: Clustered Wound: No Comorbid Cataracts, Arrhythmia, Coronary Artery Disease, Hypertension, History: Type II Diabetes, Osteoarthritis, Neuropathy Photos Wound Measurements Length: (cm) 1 Width: (cm) 2.5 Depth: (cm) 0.2 Area: (cm) 1.963 Volume: (cm) 0.393 % Reduction in Area: -38.8% % Reduction in Volume: -38.9% Epithelialization: Small (1-33%) Wound Description Classification: Grade 1 Wound Margin: Flat and Intact Exudate Amount: Medium Exudate Type: Serosanguineous Exudate Color: red, brown Foul Odor After Cleansing: No Slough/Fibrino Yes Wound Bed Granulation Amount: Small (1-33%) Exposed Structure Granulation Quality: Pink Fascia Exposed: No Necrotic Amount: Large (67-100%) Fat Layer (Subcutaneous Tissue) Exposed: Yes Necrotic Quality: Adherent Slough Tendon Exposed: No Muscle Exposed: No Joint Exposed: No Bone Exposed: No Treatment Notes Wound #10 (Calcaneus) Wound Laterality: Left Cleanser Peri-Wound Care KEVIS, OTTAVIANI ([id number]) [id number] Page 9 of  9 Topical Mupirocin Ointment Discharge Instruction: Apply as directed by Aldrick Derrig. Primary Dressing Hydrofera Blue Ready Transfer Foam, 2.5x2.5 (in/in) Discharge Instruction: Apply Hydrofera Blue Ready to wound bed as directed Secondary Dressing Zetuvit Plus 4x4 (in/in) Secured With Compression Wrap Urgo K2 Lite, two layer compression system, regular Compression Stockings Add-Ons Electronic Signature(s) Signed: 04/06/2023 8:00:35 AM By: Betha Loa Signed: 04/08/2023 12:58:51 PM By: [name] RN Entered By: Betha Loa on 04/05/2023 14:17:23 -------------------------------------------------------------------------------- Vitals Details Patient Name: Date of Service: [name], [name] 04/05/2023 1:45 PM Medical Record Number: [id number] Patient Account Number: [id number] Date of Birth/Sex: Treating RN: [birth year] (76 y.o. [name] Primary Care Cori Henningsen: [name] Other Clinician: Betha Loa Referring Joleah Kosak: Treating Jacorian Golaszewski/Extender: [name] in Treatment: 41 Vital Signs Time Taken: 14:01 Temperature (F): 98.2 Height (in): 70 Pulse (bpm): 66 Weight (lbs): 265 Respiratory Rate (breaths/min): 18 Body Mass Index (BMI): 38 Blood Pressure (mmHg): 131/62 Reference Range: 80 - 120 mg / dl Electronic Signature(s) Signed: 04/06/2023 8:00:35 AM By: Betha Loa Entered By: Betha Loa on 04/05/2023 14:04:34

## 2023-04-15 ENCOUNTER — Ambulatory Visit: Payer: Medicare HMO | Admitting: Physician Assistant

## 2023-04-19 ENCOUNTER — Encounter: Payer: Medicare HMO | Attending: Physician Assistant | Admitting: Physician Assistant

## 2023-04-19 DIAGNOSIS — L97522 Non-pressure chronic ulcer of other part of left foot with fat layer exposed: Secondary | ICD-10-CM | POA: Insufficient documentation

## 2023-04-19 DIAGNOSIS — E11621 Type 2 diabetes mellitus with foot ulcer: Secondary | ICD-10-CM | POA: Insufficient documentation

## 2023-04-19 DIAGNOSIS — E1142 Type 2 diabetes mellitus with diabetic polyneuropathy: Secondary | ICD-10-CM | POA: Insufficient documentation

## 2023-04-19 NOTE — Progress Notes (Signed)
JLON, QUERRY ([id number]) [id number] Page 1 of 2 Visit Report for 04/19/2023 Chief Complaint Document Details Patient Name: Date of Service: [name], [name] [facility] J. 04/19/2023 1:45 PM Medical Record Number: [id number] Patient Account Number: [id number] Date of Birth/Sex: Treating RN: [birth year] (76 y.o. [name]) [name] Primary Care Provider: [name] Other Clinician: Betha Loa Referring Provider: Treating Provider/Extender: [name] in Treatment: 24 Information Obtained from: Patient Chief Complaint Left heel ulcer Electronic Signature(s) Signed: 04/19/2023 2:07:28 PM By: [name] Derry PA-C Entered By: [name] Derry on 04/19/2023 14:07:28 -------------------------------------------------------------------------------- Problem List Details Patient Name: Date of Service: [name], [name] [name] J. 04/19/2023 1:45 PM Medical Record Number: [id number] Patient Account Number: [id number] Date of Birth/Sex: Treating RN: 1946/01/21 (76 y.o. [name] Primary Care Provider: [name] Other Clinician: Betha Loa Referring Provider: Treating Provider/Extender: [name] in Treatment: 74 Active Problems ICD-10 Encounter Code Description Active Date MDM Diagnosis E11.621 Type 2 diabetes mellitus with foot ulcer 06/19/2022 No Yes L97.522 Non-pressure chronic ulcer of other part of left foot with fat 06/19/2022 No Yes layer exposed E11.42 Type 2 diabetes mellitus with diabetic polyneuropathy 06/19/2022 No Yes [name], [name] ([id number]) [id number] Page 2 of 2 Inactive Problems Resolved Problems Electronic Signature(s) Signed: 04/19/2023 1:49:08 PM By: [name] Derry PA-C Entered By: [name] Derry on 04/19/2023 13:49:08

## 2023-04-20 NOTE — Progress Notes (Signed)
[name], [name] ([id number]) [id number] Page 1 of 9 Visit Report for 04/19/2023 Arrival Information Details Patient Name: Date of Service: [name], GEO [facility] J. 04/19/2023 1:45 PM Medical Record Number: [id number] Patient Account Number: [id number] Date of Birth/Sex: Treating RN: 1946-04-30 (77 y.o. [name]) [name] Primary Care Collen Hostler: [name] Other Clinician: Betha Loa Referring Christene Pounds: Treating Hadar Elgersma/Extender: [name] in Treatment: 53 Visit Information History Since Last Visit All ordered tests and consults were completed: No Patient Arrived: [name] Added or deleted any medications: No Arrival Time: 13:51 Any new allergies or adverse reactions: No Transfer Assistance: None Had a fall or experienced change in No Patient Identification Verified: Yes activities of daily living that may affect Secondary Verification Process Completed: Yes risk of falls: Patient Requires Transmission-Based Precautions: No Signs or symptoms of abuse/neglect since last visito No Patient Has Alerts: Yes Hospitalized since last visit: No Patient Alerts: Patient on Blood Thinner Implantable device outside of the clinic excluding No Warfarin cellular tissue based products placed in the center Type II Diabetic since last visit: Has Dressing in Place as Prescribed: Yes Has Compression in Place as Prescribed: Yes Pain Present Now: No Electronic Signature(s) Signed: 04/19/2023 5:09:19 PM By: Betha Loa Entered By: Betha Loa on 04/19/2023 13:56:58 -------------------------------------------------------------------------------- Clinic Level of Care Assessment Details Patient Name: Date of Service: [name] J. 04/19/2023 1:45 PM Medical Record Number: [id number] Patient Account Number: [id number] Date of Birth/Sex: Treating RN: [birth year] (77 y.o. [name]) [name] Primary Care Trinia Georgi: [name] Other Clinician:  Betha Loa Referring Jakayden Cancio: Treating Shlomo Seres/Extender: [name] in Treatment: 43 Clinic Level of Care Assessment Items TOOL 1 Quantity Score []  - 0 Use when EandM and Procedure is performed on INITIAL visit ASSESSMENTS - Nursing Assessment / Reassessment []  - 0 General Physical Exam (combine w/ comprehensive assessment (listed just below) when performed on new pt. 21 [name] AdamsIBRAHIMA, [name] ([id number]) [id number] Page 2 of 9 []  - 0 Comprehensive Assessment (HX, ROS, Risk Assessments, Wounds Hx, etc.) ASSESSMENTS - Wound and Skin Assessment / Reassessment []  - 0 Dermatologic / Skin Assessment (not related to wound area) ASSESSMENTS - Ostomy and/or Continence Assessment and Care []  - 0 Incontinence Assessment and Management []  - 0 Ostomy Care Assessment and Management (repouching, etc.) PROCESS - Coordination of Care []  - 0 Simple Patient / Family Education for ongoing care []  - 0 Complex (extensive) Patient / Family Education for ongoing care []  - 0 Staff obtains Chiropractor, Records, T Results / Process Orders est []  - 0 Staff telephones HHA, Nursing Homes / Clarify orders / etc []  - 0 Routine Transfer to another Facility (non-emergent condition) []  - 0 Routine Hospital Admission (non-emergent condition) []  - 0 New Admissions / Manufacturing engineer / Ordering NPWT Apligraf, etc. , []  - 0 Emergency Hospital Admission (emergent condition) PROCESS - Special Needs []  - 0 Pediatric / Minor Patient Management []  - 0 Isolation Patient Management []  - 0 Hearing / Language / Visual special needs []  - 0 Assessment of Community assistance (transportation, D/C planning, etc.) []  - 0 Additional assistance / Altered mentation []  - 0 Support Surface(s) Assessment (bed, cushion, seat, etc.) INTERVENTIONS - Miscellaneous []  - 0 External ear exam []  - 0 Patient Transfer (multiple staff / Nurse, adult / Similar  devices) []  - 0 Simple Staple / Suture removal (25 or less) []  - 0 Complex Staple / Suture removal (26 or more) []  - 0 Hypo/Hyperglycemic Management (do not check if billed separately) []  -  0 Ankle / Brachial Index (ABI) - do not check if billed separately Has the patient been seen at the hospital within the last three years: Yes Total Score: 0 Level Of Care: ____ Electronic Signature(s) Signed: 04/19/2023 5:09:19 PM By: Betha Loa Entered By: Betha Loa on 04/19/2023 14:38:17 -------------------------------------------------------------------------------- Compression Therapy Details Patient Name: Date of Service: [name], [name] 04/19/2023 1:45 PM Medical Record Number: [id number] Patient Account Number: [id number] Date of Birth/Sex: Treating RN: Mar 10, 1946 (77 y.o. [name] Primary Care Nathen Balaban: [name] Other Clinician: Amauri, Hochstetler ([id number]) [id number] Page 3 of 9 Referring Brason Berthelot: Treating Staisha Winiarski/Extender: [name] in Treatment: 43 Compression Therapy Performed for Wound Assessment: Wound #10 Left Calcaneus Performed By: Farrel Gordon, Angie, Compression Type: Double Layer Pre Treatment ABI: 1 Post Procedure Diagnosis Same as Pre-procedure Electronic Signature(s) Signed: 04/19/2023 5:09:19 PM By: Betha Loa Entered By: Betha Loa on 04/19/2023 14:36:59 -------------------------------------------------------------------------------- Encounter Discharge Information Details Patient Name: Date of Service: [name], [name] 04/19/2023 1:45 PM Medical Record Number: [id number] Patient Account Number: [id number] Date of Birth/Sex: Treating RN: 11-23-1945 (77 y.o. [name] Primary Care Uziel Covault: [name] Other Clinician: Betha Loa Referring Leslie Langille: Treating Maryiah Olvey/Extender: [name] in Treatment: 25 Encounter  Discharge Information Items Post Procedure Vitals Discharge Condition: Stable Temperature (F): 98.1 Ambulatory Status: Walker Pulse (bpm): 70 Discharge Destination: Home Respiratory Rate (breaths/min): 18 Transportation: Private Auto Blood Pressure (mmHg): 137/66 Accompanied By: self Schedule Follow-up Appointment: Yes Clinical Summary of Care: Electronic Signature(s) Signed: 04/19/2023 5:09:19 PM By: Betha Loa Entered By: Betha Loa on 04/19/2023 14:55:29 -------------------------------------------------------------------------------- Lower Extremity Assessment Details Patient Name: Date of Service: [name] J. 04/19/2023 1:45 PM Medical Record Number: [id number] Patient Account Number: [id number] Date of Birth/Sex: Treating RN: [birth year] (77 y.o. [name] Primary Care Sunita Demond: [name] Other Clinician: Betha Loa Referring Shakira Los: Treating Guiselle Mian/Extender: [name] in Treatment: [address] ([id number]) [id number] Page 4 of 9 Edema Assessment Assessed: [Left: Yes] [Right: No] Edema: [Left: Ye] [Right: s] Calf Left: Right: Point of Measurement: 33 cm From Medial Instep 40.2 cm Ankle Left: Right: Point of Measurement: 11 cm From Medial Instep 22.4 cm Vascular Assessment Pulses: Dorsalis Pedis Palpable: [Left:Yes] Electronic Signature(s) Signed: 04/19/2023 5:09:19 PM By: Betha Loa Signed: 04/20/2023 3:15:47 PM By: [name] RN Entered By: Betha Loa on 04/19/2023 14:14:43 -------------------------------------------------------------------------------- Multi Wound Chart Details Patient Name: Date of Service: [name], [name] 04/19/2023 1:45 PM Medical Record Number: [id number] Patient Account Number: [id number] Date of Birth/Sex: Treating RN: 04-01-1946 (77 y.o. [name] Primary Care Aveion Nguyen: [name] Other Clinician: Betha Loa Referring  Ciaira Natividad: Treating Hayes Rehfeldt/Extender: [name] in Treatment: 43 Vital Signs Height(in): 70 Pulse(bpm): 70 Weight(lbs): 265 Blood Pressure(mmHg): 137/66 Body Mass Index(BMI): 38 Temperature(F): 98.1 Respiratory Rate(breaths/min): 18 [10:Photos:] [N/A:N/A] Left Calcaneus N/A N/A Wound Location: Gradually Appeared N/A N/A Wounding 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 N/A N/A Secondary Etiology: Cataracts, Arrhythmia, Coronary N/A N/A Comorbid History: Artery Disease, Hypertension, Type II Diabetes, Osteoarthritis, Neuropathy LAEL, GARHART ([id number]) [id number] Page 5 of 9 06/05/2022 N/A N/A Date Acquired: 83 N/A N/A Weeks of Treatment: Open N/A N/A Wound Status: No N/A N/A Wound Recurrence: 0.8x1.8x0.2 N/A N/A Measurements L x W x D (cm) 1.131 N/A N/A A (cm) : rea 0.226 N/A N/A Volume (cm) : 20.00% N/A N/A % Reduction in A  rea: 20.10% N/A N/A % Reduction in Volume: Grade 1 N/A N/A Classification: Medium N/A N/A Exudate A mount: Serosanguineous N/A N/A Exudate Type: red, brown N/A N/A Exudate Color: Flat and Intact N/A N/A Wound Margin: Small (1-33%) N/A N/A Granulation A mount: Pink N/A N/A Granulation Quality: Large (67-100%) N/A N/A Necrotic A mount: Fat Layer (Subcutaneous Tissue): Yes N/A N/A Exposed Structures: Fascia: No Tendon: No Muscle: No Joint: No Bone: No Small (1-33%) N/A N/A Epithelialization: Treatment Notes Electronic Signature(s) Signed: 04/19/2023 5:09:19 PM By: Betha Loa Entered By: Betha Loa on 04/19/2023 14:14:52 -------------------------------------------------------------------------------- Multi-Disciplinary Care Plan Details Patient Name: Date of Service: [name], [name] 04/19/2023 1:45 PM Medical Record Number: [id number] Patient Account Number: [id number] Date of Birth/Sex: Treating  RN: 27-Aug-1946 (77 y.o. [name]) [name] Primary Care Elizah Mierzwa: [name] Other Clinician: Betha Loa Referring Johnluke Haugen: Treating Zykerria Tanton/Extender: [name] in Treatment: 43 Active Inactive Necrotic Tissue Nursing Diagnoses: Impaired tissue integrity related to necrotic/devitalized tissue Knowledge deficit related to management of necrotic/devitalized tissue Goals: Necrotic/devitalized tissue will be minimized in the wound bed Date Initiated: 03/04/2023 Target Resolution Date: 03/18/2023 Goal Status: Active Patient/caregiver will verbalize understanding of reason and process for debridement of necrotic tissue Date Initiated: 03/04/2023 Target Resolution Date: 03/04/2023 Goal Status: Active Interventions: Assess patient pain level pre-, during and post procedure and prior to discharge Provide education on necrotic tissue and debridement process Treatment Activities: Excisional debridement : 03/04/2023 MEDHANSH, SPANGLER ([id number]) [id number] Page 6 of 9 Notes: Wound/Skin Impairment Nursing Diagnoses: Impaired tissue integrity Goals: Patient/caregiver will verbalize understanding of skin care regimen Date Initiated: 06/19/2022 Date Inactivated: 07/10/2022 Target Resolution Date: 06/19/2022 Goal Status: Met Ulcer/skin breakdown will have a volume reduction of 30% by week 4 Date Initiated: 06/19/2022 Date Inactivated: 07/28/2022 Target Resolution Date: 07/17/2022 Goal Status: Met Ulcer/skin breakdown will have a volume reduction of 50% by week 8 Date Initiated: 07/28/2022 Target Resolution Date: 03/20/2023 Goal Status: Active Interventions: Assess patient/caregiver ability to obtain necessary supplies Assess patient/caregiver ability to perform ulcer/skin care regimen upon admission and as needed Assess ulceration(s) every visit Provide education on ulcer and skin care Treatment Activities: Skin care regimen initiated :  06/19/2022 Topical wound management initiated : 06/19/2022 Notes: Electronic Signature(s) Signed: 04/19/2023 5:09:19 PM By: Betha Loa Signed: 04/20/2023 3:15:47 PM By: [name] RN Entered By: Betha Loa on 04/19/2023 14:54:01 -------------------------------------------------------------------------------- Pain Assessment Details Patient Name: Date of Service: [name], [name] 04/19/2023 1:45 PM Medical Record Number: [id number] Patient Account Number: [id number] Date of Birth/Sex: Treating RN: 07-23-1946 (77 y.o. [name] Primary Care Windsor Goeken: [name] Other Clinician: Betha Loa Referring Virgil Lightner: Treating Chalese Peach/Extender: [name] in Treatment: 43 Active Problems Location of Pain Severity and Description of Pain Patient Has Paino No Site Locations PEJA, HARTTER ([id number]) [id number] Page 7 of 9 Pain Management and Medication Current Pain Management: Electronic Signature(s) Signed: 04/19/2023 5:09:19 PM By: Betha Loa Signed: 04/20/2023 3:15:47 PM By: [name] RN Entered By: Betha Loa on 04/19/2023 14:02:24 -------------------------------------------------------------------------------- Patient/Caregiver Education Details Patient Name: Date of Service: [name], GEO Cherrie Gauze 7/1/2024andnbsp1:45 PM Medical Record Number: [id number] Patient Account Number: [id number] Date of Birth/Gender: Treating RN: January 13, 1946 (77 y.o. [name] Primary Care Physician: [name] Other Clinician: Betha Loa Referring Physician: Treating Physician/Extender: [name] in Treatment: 75 Education Assessment Education Provided To: Patient Education Topics Provided Wound/Skin Impairment: Handouts: Other: continue wound care as directed Methods: Explain/Verbal Responses: State content correctly  Electronic Signature(s) Signed: 04/19/2023 5:09:19 PM By: Betha Loa Entered By: Betha Loa on 04/19/2023 14:54:33 Barrie Folk ([id number]) [id number] Page 8 of 9 -------------------------------------------------------------------------------- Wound Assessment Details Patient Name: Date of Service: [name], [name] 04/19/2023 1:45 PM Medical Record Number: [id number] Patient Account Number: [id number] Date of Birth/Sex: Treating RN: 1946-09-12 (77 y.o. [name]) [name] Primary Care Kimani Hovis: [name] Other Clinician: Betha Loa Referring Montserrath Madding: Treating Dallana Mavity/Extender: Hermine Messick Weeks in Treatment: 43 Wound Status Wound Number: 10 Primary Diabetic Wound/Ulcer of the Lower Extremity Etiology: Wound Location: Left Calcaneus Secondary Pressure Ulcer Wounding Event: Gradually Appeared Etiology: Date Acquired: 06/05/2022 Wound Open Weeks Of Treatment: 43 Status: Clustered Wound: No Comorbid Cataracts, Arrhythmia, Coronary Artery Disease, Hypertension, History: Type II Diabetes, Osteoarthritis, Neuropathy Photos Wound Measurements Length: (cm) 0.8 Width: (cm) 1.8 Depth: (cm) 0.2 Area: (cm) 1.131 Volume: (cm) 0.226 % Reduction in Area: 20% % Reduction in Volume: 20.1% Epithelialization: Small (1-33%) Wound Description Classification: Grade 1 Wound Margin: Flat and Intact Exudate Amount: Medium Exudate Type: Serosanguineous Exudate Color: red, brown Foul Odor After Cleansing: No Slough/Fibrino Yes Wound Bed Granulation Amount: Small (1-33%) Exposed Structure Granulation Quality: Pink Fascia Exposed: No Necrotic Amount: Large (67-100%) Fat Layer (Subcutaneous Tissue) Exposed: Yes Necrotic Quality: Adherent Slough Tendon Exposed: No Muscle Exposed: No Joint Exposed: No Bone Exposed: No Treatment Notes Wound #10 (Calcaneus) Wound Laterality: Left Cleanser Peri-Wound Care AMI, KUMAGAI ([id number]) [id number] Page 9 of  9 Topical Mupirocin Ointment Discharge Instruction: Apply as directed by Damya Comley. Primary Dressing Hydrofera Blue Ready Transfer Foam, 2.5x2.5 (in/in) Discharge Instruction: Apply Hydrofera Blue Ready to wound bed as directed Secondary Dressing Zetuvit Plus 4x4 (in/in) Secured With Compression Wrap Urgo K2 Lite, two layer compression system, regular Compression Stockings Add-Ons Electronic Signature(s) Signed: 04/19/2023 5:09:19 PM By: Betha Loa Signed: 04/20/2023 3:15:47 PM By: [name] RN Entered By: Betha Loa on 04/19/2023 14:13:38 -------------------------------------------------------------------------------- Vitals Details Patient Name: Date of Service: [name], [name] 04/19/2023 1:45 PM Medical Record Number: [id number] Patient Account Number: [id number] Date of Birth/Sex: Treating RN: 1946/02/17 (77 y.o. [name]) [name] Primary Care Caidin Heidenreich: [name] Other Clinician: Betha Loa Referring Elenora Hawbaker: Treating Talin Rozeboom/Extender: [name] in Treatment: 43 Vital Signs Time Taken: 13:58 Temperature (F): 98.1 Height (in): 70 Pulse (bpm): 70 Weight (lbs): 265 Respiratory Rate (breaths/min): 18 Body Mass Index (BMI): 38 Blood Pressure (mmHg): 137/66 Reference Range: 80 - 120 mg / dl Electronic Signature(s) Signed: 04/19/2023 5:09:19 PM By: Betha Loa Entered By: Betha Loa on 04/19/2023 14:02:11

## 2023-04-23 ENCOUNTER — Ambulatory Visit: Payer: Medicare HMO | Admitting: Physician Assistant

## 2023-04-26 ENCOUNTER — Encounter: Payer: Medicare HMO | Admitting: Physician Assistant

## 2023-04-26 DIAGNOSIS — E11621 Type 2 diabetes mellitus with foot ulcer: Secondary | ICD-10-CM | POA: Diagnosis not present

## 2023-04-26 NOTE — Progress Notes (Addendum)
LANGDON, CROSSON ([id number]) [id number] Page 1 of 13 Visit Report for 04/26/2023 Chief Complaint Document Details Patient Name: Date of Service: [name], [name] [facility] J. 04/26/2023 1:45 PM Medical Record Number: [id number] Patient Account Number: [id number] Date of Birth/Sex: Treating RN: May 03, 1946 (77 y.o. [name]) [name] Primary Care Provider: [name] Other Clinician: Betha Loa Referring Provider: Treating Provider/Extender: [name] in Treatment: 76 Information Obtained from: Patient Chief Complaint Left heel ulcer Electronic Signature(s) Signed: 04/26/2023 1:50:15 PM By: [name] Derry PA-C Entered By: [name] Derry on 04/26/2023 13:50:15 -------------------------------------------------------------------------------- Debridement Details Patient Name: Date of Service: [name], [name] [name] J. 04/26/2023 1:45 PM Medical Record Number: [id number] Patient Account Number: [id number] Date of Birth/Sex: Treating RN: 10-29-1945 (77 y.o. [name]) [name] Primary Care Provider: [name] Other Clinician: Betha Loa Referring Provider: Treating Provider/Extender: [name] in Treatment: 44 Debridement Performed for Assessment: Wound #10 Left Calcaneus Performed By: Physician [name] Derry, PA-C Debridement Type: Debridement Severity of Tissue Pre Debridement: Fat layer exposed Level of Consciousness (Pre-procedure): Awake and Alert Pre-procedure Verification/Time Out Yes - 14:16 Taken: Start Time: 14:16 Percent of Wound Bed Debrided: 100% T Area Debrided (cm): otal 0.44 Tissue and other material debrided: Viable, Non-Viable, Callus, Slough, Subcutaneous, Slough Level: Skin/Subcutaneous Tissue Debridement Description: Excisional Instrument: Curette Bleeding: Minimum Hemostasis Achieved: Pressure Response to Treatment: Procedure was tolerated well Level of Consciousness (Post- Awake and  Alert procedure): [name], [name] ([id number]) [id number] Page 2 of 13 Post Debridement Measurements of Total Wound Length: (cm) 0.4 Width: (cm) 1.4 Depth: (cm) 0.2 Volume: (cm) 0.088 Character of Wound/Ulcer Post Debridement: Stable Severity of Tissue Post Debridement: Fat layer exposed Post Procedure Diagnosis Same as Pre-procedure Electronic Signature(s) Signed: 04/26/2023 5:05:34 PM By: [name] Derry PA-C Signed: 04/29/2023 1:44:50 PM By: [name] RN Signed: 05/03/2023 4:36:38 PM By: Betha Loa Entered By: Betha Loa on 04/26/2023 14:20:14 -------------------------------------------------------------------------------- HPI Details Patient Name: Date of Service: [name], [name] [name] J. 04/26/2023 1:45 PM Medical Record Number: [id number] Patient Account Number: [id number] Date of Birth/Sex: Treating RN: 02/15/1946 (77 y.o. [name] Primary Care Provider: [name] Other Clinician: Betha Loa Referring Provider: Treating Provider/Extender: [name] in Treatment: 44 History of Present Illness HPI Description: 09/24/2020 on evaluation today patient presents today for a heel ulcer that he tells me has been present for about 2 years. He has been seeing podiatry and they have been attempting to manage this including what sounds to be a total contact cast, Unna boot, and just standard dressings otherwise as well. Most recently has been using triple antibiotic ointment. With that being said unfortunately despite everything he really has not had any significant improvement. He tells me that he cannot even really remember exactly how this began but he presumed it may have rubbed on his shoes or something of that nature. 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 He tells me that this does seem to help. Fortunately there is no signs of active infection at this time. His most recent hemoglobin A1c was 8.1 though he knows this was this year he cannot tell me the exact time. His fluid pills currently to help with some of the lower  extremity edema although he does obviously have signs of venous stasis/lymphedema. Currently there is no evidence of active infection. No fevers, chills, nausea, vomiting, or diarrhea. Patient has had fairly recent ABIs which were performed on 07/19/2020 and revealed that he has normal findings in both the ankle and toe locations bilaterally. His ABI on the right was 1.09 on the left was 1.08 with a TBI on the right of 0.88 and on the left of 0.94. Triphasic flow was noted throughout. 10/08/2020 on evaluation today patient appears to be doing pretty well in regard to his left heel currently in fact this is doing a great job and seems to be healing quite nicely. Unfortunately on his right leg he had a pile of wood that actually fell on him injuring his right leg this is somewhat erythematous has me concerned little bit about cellulitis though there is not really a good area to culture at this point. 10/24/2020 upon evaluation today patient appears to be doing well with regard to his heel ulcer. He is showing signs of improvement which is great news. His right leg is completely healed. Overall I feel like he is doing excellent and there is no signs of infection. 11/07/2020 upon evaluation today patient appears to be doing well with regard to his heel ulcer. He tells me that last week when he was unable to come in his wife actually thought that the wound was very close to closing if not closed. Then it began to "reopen again". I really feel like what may have happened as the collagen may have dried over the wound bed  and that because that misunderstanding with thinking that the wound was healing. With that being said I did not see it last week I do not know that for certain. Either way I feel like he is doing great today I see no signs of infection at this point. 11/26/2020 upon inspection today patient appears to be doing decently well in regard to his heel ulcer. He has been tolerating the dressing changes without complication. Fortunately there is no sign of active infection at this time. No fevers, chills, nausea, vomiting, or diarrhea. 12/10/2020 upon evaluation today patient appears to be doing fairly well in regard to the wound on his heel as well as what appears to be a new wound of the left first metatarsal head plantar aspect. This seems to be an area that was callus that has split as the patient tells me has been trying to walk on his toes more has probably where this came from. With that being said there does not appear to be signs of active infection which is great news. 12/17/2020 upon evaluation today patient actually appears to be making good progress currently. Fortunately there is no evidence of active infection at this time. Overall I feel like he is very close to complete closure. 12/26/2020 upon evaluation today patient appears to be doing excellent in regard to his wounds. In fact I am not certain that these are not even completely healed on initial inspection. Overall I am very pleased with where things stand today. Good news is that they are healed he is actually get ready to go out of KRZYSZTOF, REICHELT ([id number]) [id number] Page 3 of 59 town and that will be helpful as well as he will be a full part of the time. Readmission: 02/04/2021 upon evaluation today patient appears to be doing well at this point in regard to his left heel that I previously saw him  for. Unfortunately he is having issues with his right lower extremity. He has significant wounds at this point he  also has erythema noted there is definite signs of cellulitis which is unfortunate. With that being said I think we do need to address this sooner rather than later. The good news is he did have a nice trip to the beach. He tells me that he had no issues during that time. 4/27; patient on Bactrim. Culture showed methicillin sensitive staph aureus therefore the Bactrim should be effective. 2 small areas on the right leg are healed the area on the mid aspect of the tibia almost 100% covered in a very adherent necrotic debris. We have been using silver alginate 02/20/2021 upon evaluation today patient appears to be doing well with regard to his wound. He is showing signs of improvement which is great news overall very pleased with where things stand today. No fevers, chills, nausea, vomiting, or diarrhea. 02/27/2021 upon evaluation today patient appears to be doing well with regard to his leg ulcer. He is tolerating the dressing changes and overall appears to be doing quite excellent. I am extremely pleased with where things stand and overall I think patient is making great progress. There is no sign of active infection at this time which is also great news. 03/06/2021 upon evaluation today patient appears to be doing excellent in regard to his wounds. In fact he appears to be completely healed today based on what I am seeing. This is excellent news and overall I am extremely pleased with where he stands. Overall the patient is happy to hear this as well this has been quite sometime coming. Readmission: 04/01/2021 patient unfortunately returns for readmission today. He tells me that he has been wearing his compression socks on the right leg daily and he does not really know what is going on and why his legs are doing what they are doing. We did therefore go ahead and probe deeper into exactly what has been going on with him today. Subsequently the patient tells me that when he gets up and what we would call  "first thing in the morning" are really 2 different things". He does not tend to sleep well so he tells me that he will often wake up at 3:00 in the morning. He will then potentially going into the living room to get in his chair where he may read a book for a little while and then potentially fall asleep back in his chair. He then subsequently wake up around 5:00 or so and then get up and in his words "putter around". This often will end with him proceeding at some point around 8 AM or 9 AM to putting on his compression socks. With that being said this means that anywhere from the 3:00 in the morning till roughly around 9:00 in the morning he has no compression on yet he is sitting in his recliner, walking around and up and about, and this is at least 5 to 6 hours of noncompressed time. That may be our issue here but I did not realize until we discussed this further today. Fortunately there does not appear to be any signs of active infection at this time which is great news. With that being said he has multiple wounds of the bilateral lower extremities. 04/10/2021 upon evaluation today patient appears to be doing well with regard to his legs for the most part. He does look like he had some injury where his wrap slid down  nonetheless he did some "doctoring on them". I do feel like most of the areas honestly have cleared back up which is great news. There does not appear to be any signs of active infection at this time which is also great news. No fevers, chills, nausea, vomiting, or diarrhea. 04/18/2021 upon evaluation today patient appears to be doing well with regard to his wounds in fact he appears to be completely healed which is great news. Fortunately there does not appear to be any signs of active infection which is great news. No fevers, chills, nausea, vomiting, or diarrhea. READMISSION 07/28/2021 This is a now 77 year old man we have had in this clinic for quite a bit of this year. He has been  in here with bilateral lower extremity leg wounds probably secondary to chronic venous insufficiency. He wears compression stockings. 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 He comes in an old running shoes although he says he wears better shoes at home. His history is that he noticed a blister in the right posterior heel. This open. He has been applying Neosporin to it currently the wound measures 2 x 1.4 cm. 100% slough covered. Not really offloading this in any rigorous way. Past medical history is essentially unchanged he has paroxysmal atrial fibrillation on Coumadin type 2 diabetes with a recent hemoglobin A1c of 7 on metformin. ABI in our clinic was 0.98 on the right 08/05/2021 upon evaluation today patient appears to be doing well with regard to his heel ulcer. Fortunately there does not appear to be any signs of active infection at this time. Overall I been very pleased with where things seem to be currently as far as the wound healing is concerned. Again this is first a lot of seeing him Dr. Leanord Hawking readmitted him last week. Nonetheless I think we are definitely making some progress here. 08/11/2021 upon evaluation today patient's wound on the heel actually showing signs of excellent improvement. I am actually very pleased with where things stand currently. No fevers, chills, nausea, vomiting, or diarrhea. There does not appear to be any need for sharp debridement today either which is also great news. 08/25/2021 upon evaluation today patient appears to be doing well with regard to his heel ulcer. This is actually looking significantly improved compared to last time I saw him. Fortunately there does not appear to be any evidence of active infection at this time. No fevers, chills, nausea, vomiting, or diarrhea. 09/01/2021 upon evaluation today patient appears to be doing decently well in regard to his wounds. Fortunately there does not appear to be  any signs of active infection at this time which is great news and overall very pleased in that regard. I do not see any evidence of active infection systemically which is great news as well. No fevers, chills, nausea, vomiting, or diarrhea. I think that the heel is making excellent progress. 09/15/2021 upon evaluation today patient's heel unfortunately is significantly worse compared to what it was previous. I do believe that at this time he would benefit from switching back to something a little bit more offloading from the cushion shoe he has right now this is more of a heel protector what I really need is a Prevalon offloading boot which I think is good to do a lot better for him than just the small heel protector. 09/23/2021 upon evaluation today patient appears to be doing a little better in regards to last week's visit. Overall I think that he  is making good progress which is great news and there does not appear to be any evidence of active infection at this time. No fevers, chills, nausea, vomiting, or diarrhea. 09/30/2021 upon evaluation patient's heel is actually showing signs of good improvement which is great news. Fortunately there does not appear to be any evidence of active infection locally nor systemically at this point. No fevers, chills, nausea, vomiting, or diarrhea. 10/07/2021 upon evaluation today patient appears to be doing well currently in regard to his heel ulcer. He has been tolerating the dressing changes without complication. Fortunately I do not see any signs of active infection at this time which is great news. No fevers, chills, nausea, vomiting, or diarrhea. 12/27; wound is measuring smaller. We have been using silver alginate 10/28/2021 upon evaluation today patient appears to be doing well currently in regard to his heel ulcer. I am actually very pleased with where things stand and I think he is making excellent progress. Fortunately I do not see any signs of active  infection locally nor systemically at this point which is great news. Nonetheless I do believe that the patient would benefit from a new offloading shoe and has been using it Velcro is no longer functioning properly and he tells me that he almost tripped and fell because of that today. Obviously I do not want him following that would be very bad. [name], [name] ([id number]) [id number] Page 4 of 13 11/11/2021 upon evaluation today patient appears to be doing excellent in regard to his wound. In fact this appears to be completely healed based on what I see currently. I do not see any signs of anything open or draining and I did double check just by clearing away some of the callus around the edges of the wound to ensure that there was nothing still open or hiding underneath. Once this was cleared away it appeared that the patient was doing significantly better at this time which is great news and there was nothing actually open. Readmission: 06-19-2022 upon evaluation today patient appears to be doing well currently in regard to his heel ulcer all things considered. He unfortunately is having a bit of breakdown in general as far as the heel is concerned not nearly as bad as what we noted last time he was here in the clinic. The good news is I do think that this should hopefully heal much more rapidly than what we noted last time. His past medical history has not changed he is on Coumadin. 07-03-2022 upon evaluation today patient appears to be doing better in regard to his heel. We will start to see some improvement here which is good news. Fortunately I do not see any evidence of infection locally or systemically at this time which is great news. No fevers, chills, nausea, vomiting, or diarrhea. 07-10-2022 upon evaluation today patient appears to be doing well currently in regard to his wound from the callus standpoint around the edges. Unfortunately from an actual wound bed  standpoint he has some deep tissue injury noted at this point. Again I am not sure exactly what is going on that is causing this pressure to the region but he is definitely gotten pressure that is occurring and causing this to not heal as effectively as what we would like to see. I discussed with him today that he may need to at night when he sleeping use a pillow up underneath his calf in order to prevent the heel from touching the bed at all.  I also think that it may be beneficial for him to monitor throughout his day and make sure there is no other time when he is getting the pressure to the heel. 07-23-2022 upon evaluation today patient appears to be doing poorly currently in regard to his heel ulcer. He has been tolerating the dressing changes without complication. Unfortunately I feel like he may be infected based on what I am seeing. 07-28-2022 I did review patient's culture results which showed positive for Proteus as well as Staphylococcus both of which would be treated with the Bactrim DS that I gave him at the last visit. This is good news and hopefully means that we should be able to apply the cast today as long as the wound itself does not appear to be doing poorly. Patient's wound bed actually showed signs of doing quite well and I am very pleased with where things stand and I do not see any signs of worsening overall which is great news. 08-04-2022 upon evaluation today patient appears to be doing well currently in regard to his heel ulcer. I am actually very pleased with where things stand and I do think this is looking significantly better. With regard to his right shin that is also showing signs of improvement which is great news. 08-10-2022 upon evaluation today patient's wound on the heel actually appears to be doing significantly better. In regard to the right anterior shin this is showing signs of healing it might even be completely healed but I still get a monitor I cannot get  anything to fill away but also could not see where there was anything actually draining at this point. I am tending to think healed but I want a monitor 1 more week before I call it for sure. 08-17-2022 upon evaluation today patient appears to be doing well currently in regard to his heel. Fortunately he is tolerating the dressing changes without complication. I do not see any signs of infection and overall I think he is making good progress here. We did get approval for the Apligraf but I think he had a $295 co-pay that would have to be paid per application. With that being said as good as he is doing right now I do not think that is necessary but is still an option depending on how things progress if we need to speed this up quite a bit. 08/24/2022; this patient has a wound on his left heel in the setting of type 2 diabetes. We have been using Prisma. He has a heel offloading boot 08-31-2022 upon evaluation today patient appears to be doing poorly in regard to his heel ulcer which is still showing signs of bruising I am just not as happy as I was when I saw him 2 weeks ago with this. He saw Dr. Leanord Hawking last week. Also did not look good at that point apparently. Nonetheless I think that the patient is still continuing to have some counterpressure getting to the wound bed unfortunately. 09-07-2022 upon evaluation today patient appears to be doing well currently in regard to his heel ulcer. He has been tolerating the dressing changes without complication. Fortunately there does not appear to be any signs of active infection locally or systemically at this time. Fortunately I do not see any evidence of active infection at this time. 09-15-2022 upon evaluation today patient appears to be doing well currently in regard to his legs which in fact appear to be completely healed this is great news. With that being  said his heel does have some erythema and warmth as well as drainage I am concerned about  cellulitis at this location. 09-21-2022 upon evaluation today patient's wound is actually showing signs of being a little bit smaller but still we are not doing nearly as well as what I would like to see. Fortunately I do not see any signs of infection at this time which is great news and overall I am extremely pleased in that regard. With that being said I do think that he needs to continue to elevate his leg although the edema is under great control with a compression wrap I think switching to a different dressing topically may be beneficial. My suggestion is to switch him over to a Iodoflex dressing. 09-28-2022 upon evaluation today patient appears to be doing well with regard to his heel ulcer. This is actually showing signs of improvement which is great news and overall I am extremely pleased with where we stand today. 10-06-2022 upon evaluation today patient actually appears to be making excellent progress at this point. Fortunately there does not appear to be any signs of active infection locally nor systemically which is great news and overall I am extremely pleased with where we stand. I do feel like he is actually making some pretty good progress here as we switch to the wrap along with the Iodoflex. 12/26; patient has a wound on the tip of his left heel. This is not a weightbearing surface. He is using a heel offloading boot and carefully offloading this at other times during the day. He is using Iodoflex and Zetuvitunder 3 layer compression 10-22-2022 upon evaluation today patient appears to be doing well currently in regard to his wound. He has been tolerating the dressing changes without complication. Fortunately there does not appear to be any signs of active infection locally nor systemically at this time. No fevers, chills, nausea, vomiting, or diarrhea. 08-30-2023 upon evaluation today patient appears to be doing well currently in regard to his heel ulcer which is showing signs of good  improvement. Fortunately I do not see any evidence of infection locally nor systemically which is great news and overall I am extremely pleased with where we stand. No fevers, chills, nausea, vomiting, or diarrhea. Unfortunately he does have a wound on the side of his fifth toe where I think this did rub on the shoe. 11-05-2022 upon evaluation today patient still still showing some signs of pressure at this point and there may have even been some fluid collection at this time. Fortunately I do not see any evidence of active infection locally nor systemically which is great news and overall I am extremely pleased with where we stand. No fevers, chills, nausea, vomiting, or diarrhea. 1/25; the patient's area on the dorsal aspect of the left fifth toe is healed. We have been using Iodoflex to the area on the left heel. The lateral wound is slightly down in length. 11-19-2022 upon evaluation today patient appears to be doing well currently in regard to his wound. He has been tolerating the dressing changes without complication. Fortunately there does not appear to be any signs of infection we been using Iodoflex up to this point. 11-26-2022 upon evaluation today patient appears to be doing poorly currently in regard to his wound. He has been tolerating the dressing changes with the Iodoflex without complication. With that being said he does not have any signs of active infection systemically though locally I do feel like there is some evidence of  infection here. 12-03-2022 upon evaluation today patient appears to be doing well currently in regard to his wound this is better with the Bactrim compared to last week still there was some significant slough and biofilm buildup which is going require sharp debridement at this point. Fortunately I do not see any signs of active infection [name], [name] ([id number]) [id number] Page 5 of 13 locally nor systemically which is great  news. 12-11-2022 upon evaluation today patient actually appears to be doing significantly better at this time in regard to his heel. I am actually very pleased with where things stand currently. There is no signs of active infection locally or systemically at this point. 12-17-2022 upon evaluation today patient appears to be doing better as far as the overall appearance of his wound. I am actually very pleased in that regard. Fortunately there does not appear to be any signs of active infection locally nor systemically at this time. I do feel like that the patient is showing signs of excellent improvement in general. Nonetheless he needs something better for compression I think he should go ahead and see about getting compression socks. 3/7; tip of the left heel measurements are smaller he has been using Hydrofera Blue 01-07-2023 upon evaluation today patient appears to be doing well currently in regard to his wound. This has not been debrided since I last saw him and it definitely shows that definitely needs some debridement today. Fortunately I do not see any evidence of active infection locally nor systemically which is great news. 01-14-2023 upon evaluation today patient appears to be doing well currently although he does have some erythema and warmth to the heel I feel like he may have developed an infection yet again. This is something that is been ongoing issue we have been trying to manage his foot best we could. With that being said I do think he may need to go back on the Bactrim this has done well for him in the past. 01-21-2023 upon evaluation today patient appears to be doing better in regard to his heel. He is doing very well with the antibiotics and that is made a big difference for him based on what I am seeing I am very pleased with where we stand today. He is obviously doing extremely well which is great news. 01-28-2023 upon evaluation today patient appears to be doing well currently in  regard to his wound. This is actually showing signs of excellent improvement I am actually very pleased with where we stand and I do believe that he is making good progress here. Fortunately I do not see any evidence of active infection locally nor systemically which is great news. No fevers, chills, nausea, vomiting, or diarrhea. 02-04-2023 upon evaluation today patient's wound is actually showed signs of excellent improvement I am actually very pleased with where we stand. I do not see any signs of active infection locally nor systemically which is great news and in general I do believe that we are moving in the right direction here. No fevers, chills, nausea, vomiting, or diarrhea. 02-11-2023 upon evaluation patient's wound actually showing signs of excellent improvement I am actually very pleased with where we stand I think that his pain is better there is no signs of infection we will continue to use the gentamicin and overall we are making excellent progress. 02-18-2023 upon evaluation today patient appears to be doing well currently in regard to his wound. He has been tolerating the dressing changes without complication. Fortunately I  do not see any signs of active infection locally nor systemically which is great news. 02-25-2023 upon evaluation today patient's heel actually showed signs of excellent improvement. I am actually very pleased with where we stand today and I feel like that he is making really good progress. Fortunately I do not see any signs of active infection which is good news. No fevers, chills, nausea, vomiting, or diarrhea. 03-04-2023 upon evaluation today patient appears to be doing pretty well currently in regard to his heel. He has been tolerating the dressing changes without complication. Fortunately there does not appear to be any signs of active infection locally nor systemically which is great news. No fevers, chills, nausea, vomiting, or diarrhea. 03-11-2023 upon evaluation  today patient appears to be doing poorly in regard to his heel ulcer. He tells me he has been having to sleep on his back to having trouble breathing therefore I think he has been having some issues here as far as the heel is concerned it looks like he has a deep tissue injury yet again at this point. Fortunately I do not see any signs of infection which is good news but at the same time I do feel like that he is doing worse than where he was previous. I do not see any signs of active infection systemically which is great news. 03-18-2023 upon evaluation today patient appears to be doing well currently in fact the heel is significantly better compared to last week. I am extremely pleased with where we are seeing I am hopeful that he will continue to show signs of improvement going forward. 03-25-2023 upon evaluation today patient appears to be doing well currently in regard to his wounds. He is actually showing signs of improvement on the heel and I am very pleased in that regard. Fortunately I do not see any signs of active infection at this time which is great news. No fevers, chills, nausea, vomiting, or diarrhea. 04-05-2023 upon evaluation today patient's wound is actually showing signs of good improvement. Actually very pleased with where things stand today. Fortunately there does not appear to be any signs of active infection locally or systemically which is great news. 04-19-2023 upon evaluation today patient's wound actually looks the best it has in quite some time. He did have a wrap on for 2 weeks which I think is caused a little bit of irritation going to the forefoot everything else looks to be doing well the wrap did slide down a little bit as well. Fortunately I do not see any signs of infection however I do think that in the future if he has any issues with not being able to get scheduled to see me he should definitely make an appointment at least for a nurse visit he is aware and in agreement  with plan going forward. 04-26-2023 upon evaluation today patient's wound is actually significantly smaller this is great news he is making good progress in the past really 5 or 6 visits we have been seeing improvement week by week I am very pleased in that regard. Electronic Signature(s) Signed: 04/26/2023 2:32:05 PM By: [name] Derry PA-C Entered By: [name] Derry on 04/26/2023 14:32:05 Physical Exam Details -------------------------------------------------------------------------------- [name] ([id number]) [id number] Page 6 of 13 Patient Name: Date of Service: [name], [name] [facility] 04/26/2023 1:45 PM Medical Record Number: [id number] Patient Account Number: [id number] Date of Birth/Sex: Treating RN: Oct 01, 1946 (76 y.o. [name]) [name] Primary Care Provider: [name] Other Clinician: Betha Loa Referring Provider:  Treating Provider/Extender: Hermine Messick Weeks in Treatment: 58 Constitutional Well-nourished and well-hydrated in no acute distress. Respiratory normal breathing without difficulty. Psychiatric this patient is able to make decisions and demonstrates good insight into disease process. Alert and Oriented x 3. pleasant and cooperative. Notes Upon inspection patient's wound bed actually showed signs of good granulation epithelization at this point. I would recommend that we continue with the wound care measures as before he is in agreement with plan we will see where things stand at follow-up. I did continue to improve this area and this is doing really well at this point. I do believe that subsequently postdebridement this looks much better and he is definitely moving in the right direction towards closure. Electronic Signature(s) Signed: 04/26/2023 2:32:36 PM By: [name] Derry PA-C Entered By: [name] Derry on 04/26/2023 14:32:36 -------------------------------------------------------------------------------- Physician Orders  Details Patient Name: Date of Service: [name], [name] [name] J. 04/26/2023 1:45 PM Medical Record Number: [id number] Patient Account Number: [id number] Date of Birth/Sex: Treating RN: [birth year] (76 y.o. 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 Bathing/ Shower/ Hygiene May shower; gently cleanse wound with antibacterial soap, rinse and pat dry prior to dressing wounds Non-Wound Condition dditional non-wound orders/instructions: - Apply 40% Urea cream to dry cracked skin on feet. A Off-Loading Open toe surgical shoe Wound Treatment Wound #10 - Calcaneus Wound Laterality: Left Topical: Mupirocin Ointment 3 x Per Week/30 Days CLOUD, GRAHAM ([id number]) [id number] Page 7 of 13 Discharge Instructions: Apply as directed by provider. Prim Dressing: Hydrofera Blue Ready Transfer Foam, 2.5x2.5 (in/in) (Dispense As Written) 3 x Per Week/30 Days ary Discharge Instructions: Apply Hydrofera Blue Ready to wound bed as directed Secondary Dressing: ABD Pad 5x9 (in/in) 3 x Per Week/30 Days Discharge Instructions: Cover with ABD pad Compression Wrap: Urgo K2 Lite, two layer compression system, regular 3 x Per Week/30 Days Electronic Signature(s) Signed: 04/26/2023 5:05:34 PM By: [name] Derry PA-C Signed: 05/03/2023 4:36:38 PM By: Betha Loa Entered By: Betha Loa on 04/26/2023  14:21:27 -------------------------------------------------------------------------------- Problem List Details Patient Name: Date of Service: [name], [name] [name] J. 04/26/2023 1:45 PM Medical Record Number: [id number] Patient Account Number: [id number] Date of Birth/Sex: Treating RN: 1946/04/01 (76 y.o. [name] Primary Care Provider: [name] Other Clinician: Betha Loa Referring Provider: Treating Provider/Extender: [name] in Treatment: 44 Active Problems ICD-10 Encounter Code Description Active Date MDM Diagnosis E11.621 Type 2 diabetes mellitus with foot ulcer 06/19/2022 No Yes 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sed 06/19/2022 No Yes E11.42 Type 2 diabetes mellitus with diabetic polyneuropathy 06/19/2022 No Yes Inactive Problems Resolved Problems Electronic Signature(s) Signed: 04/26/2023 1:50:11 PM By: [name] Derry PA-C Entered By: [name] Derry on 04/26/2023 13:50:11 [name] ([id number]) [id number] Page 8 of 13 -------------------------------------------------------------------------------- Progress Note Details Patient Name: Date of Service: [name], [name] [name] J. 04/26/2023 1:45 PM Medical Record Number: [id number] Patient Account Number: [id number] Date of Birth/Sex: Treating RN: 1946/06/22 (76 y.o. [name]) [name] Primary Care Provider: [name] Other Clinician:  Betha Loa Referring Provider: Treating Provider/Extender: Hermine Messick Weeks in Treatment: 50 Subjective Chief Complaint Information obtained from Patient Left heel ulcer History of Present Illness (HPI) 09/24/2020 on evaluation today patient presents today for a heel ulcer that he tells me has been present for about 2 years. He has been seeing podiatry and they have been attempting to manage this including what sounds to be a total contact cast, Unna boot, and just  standard dressings otherwise as well. Most recently has been using triple antibiotic ointment. With that being said unfortunately despite everything he really has not had any significant improvement. He tells me that he cannot even really remember exactly how this began but he presumed it may have rubbed on his shoes or something of that nature. 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 He tells me that this does seem to help. Fortunately there is no signs of active infection at this time. His most recent hemoglobin A1c was 8.1 though he knows this was this year he cannot tell me the exact time. His fluid pills currently to help with some of the lower extremity edema although he does obviously have signs of venous stasis/lymphedema. Currently there is no evidence of active infection. No fevers, chills, nausea, vomiting, or diarrhea. Patient has had fairly recent ABIs which were performed on 07/19/2020 and revealed that he has normal findings in both the ankle and toe locations bilaterally. His ABI on the right was 1.09 on the left was 1.08 with a TBI on the right of 0.88 and on the left of 0.94. Triphasic flow was noted throughout. 10/08/2020 on evaluation today patient appears to be doing pretty well in regard to his left heel currently in fact this is doing a great job and seems to be healing quite nicely. Unfortunately on his right leg he had a pile of wood that actually fell on him injuring his right leg this is somewhat erythematous has me concerned little bit about cellulitis though there is not really a good area to culture at this point. 10/24/2020 upon evaluation today patient appears to be doing well with regard to  his heel ulcer. He is showing signs of improvement which is great news. His right leg is completely healed. Overall I feel like he is doing excellent and there is no signs of infection. 11/07/2020 upon evaluation today patient appears to be doing well with regard to his heel ulcer. He tells me that last week when he was unable to come in his wife actually thought that the wound was very close to closing if not closed. Then it began to "reopen again". I really feel like what may have happened as the collagen may have dried over the wound bed and that because that misunderstanding with thinking that the wound was healing. With that being said I did not see it last week I do not know that for certain. Either way I feel like he is doing great today I see no signs of infection at this point. 11/26/2020 upon inspection today patient appears to be doing decently well in regard to his heel ulcer. He has been tolerating the dressing changes without complication. Fortunately there is no sign of active  infection at this time. No fevers, chills, nausea, vomiting, or diarrhea. 12/10/2020 upon evaluation today patient appears to be doing fairly well in regard to the wound on his heel as well as what appears to be a new wound of the left first metatarsal head plantar aspect. This seems to be an area that was callus that has split as the patient tells me has been trying to walk on his toes more has probably where this came from. With that being said there does not appear to be signs of active infection which is great news. 12/17/2020 upon evaluation today patient actually appears to be making good progress currently. Fortunately there is no evidence of active infection at this time. Overall I feel like he is very close to complete closure. 12/26/2020 upon evaluation today patient appears to be doing excellent in regard to his wounds. In fact I am not certain that these are not even completely healed on initial inspection.  Overall I am very pleased with where things stand today. Good news is that they are healed he is actually get ready to go out of town and that will be helpful as well as he will be a full part of the time. Readmission: 02/04/2021 upon evaluation today patient appears to be doing well at this point in regard to his left heel that I previously saw him for. Unfortunately he is having issues with his right lower extremity. He has significant wounds at this point he also has erythema noted there is definite signs of cellulitis which is unfortunate. With that being said I think we do need to address this sooner rather than later. The good news is he did have a nice trip to the beach. He tells me that he had no issues during that time. 4/27; patient on Bactrim. Culture showed methicillin sensitive staph aureus therefore the Bactrim should be effective. 2 small areas on the right leg are healed the area on the mid aspect of the tibia almost 100% covered in a very adherent necrotic debris. We have been using silver alginate 02/20/2021 upon evaluation today patient appears to be doing well with regard to his wound. He is showing signs of improvement which is great news overall very pleased with where things stand today. No fevers, chills, nausea, vomiting, or diarrhea. 02/27/2021 upon evaluation today patient appears to be doing well with regard to his leg ulcer. He is tolerating the dressing changes and overall appears to be doing quite excellent. I am extremely pleased with where things stand and overall I think patient is making great progress. There is no sign of active infection at this time which is also great news. 03/06/2021 upon evaluation today patient appears to be doing excellent in regard to his wounds. In fact he appears to be completely healed today based on what I am seeing. This is excellent news and overall I am extremely pleased with where he stands. Overall the patient is happy to hear this as  well this has been quite sometime coming. [name], [name] ([id number]) [id number] Page 9 of 13 Readmission: 04/01/2021 patient unfortunately returns for readmission today. He tells me that he has been wearing his compression socks on the right leg daily and he does not really know what is going on and why his legs are doing what they are doing. We did therefore go ahead and probe deeper into exactly what has been going on with him today. Subsequently the patient tells me that when he gets up and  what we would call "first thing in the morning" are really 2 different things". He does not tend to sleep well so he tells me that he will often wake up at 3:00 in the morning. He will then potentially going into the living room to get in his chair where he may read a book for a little while and then potentially fall asleep back in his chair. He then subsequently wake up around 5:00 or so and then get up and in his words "putter around". This often will end with him proceeding at some point around 8 AM or 9 AM to putting on his compression socks. With that being said this means that anywhere from the 3:00 in the morning till roughly around 9:00 in the morning he has no compression on yet he is sitting in his recliner, walking around and up and about, and this is at least 5 to 6 hours of noncompressed time. That may be our issue here but I did not realize until we discussed this further today. Fortunately there does not appear to be any signs of active infection at this time which is great news. With that being said he has multiple wounds of the bilateral lower extremities. 04/10/2021 upon evaluation today patient appears to be doing well with regard to his legs for the most part. He does look like he had some injury where his wrap slid down nonetheless he did some "doctoring on them". I do feel like most of the areas honestly have cleared back up which is great news. There does  not appear to be any signs of active infection at this time which is also great news. No fevers, chills, nausea, vomiting, or diarrhea. 04/18/2021 upon evaluation today patient appears to be doing well with regard to his wounds in fact he appears to be completely healed which is great news. Fortunately there does not appear to be any signs of active infection which is great news. No fevers, chills, nausea, vomiting, or diarrhea. READMISSION 07/28/2021 This is a now 77 year old man we have had in this clinic for quite a bit of this year. 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 He wears compression stockings. 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 He comes in an old running shoes although he says he wears better shoes at home. His history is that he noticed a blister in the right posterior heel. This open. He has been applying Neosporin to it currently the wound measures 2 x 1.4 cm. 100% slough covered. Not really offloading this in any rigorous way. Past medical history is essentially unchanged he has paroxysmal atrial fibrillation on Coumadin type 2 diabetes with a recent hemoglobin A1c of 7 on metformin. ABI in our clinic was 0.98 on the right 08/05/2021 upon evaluation today patient appears to be doing well with regard to his heel ulcer. Fortunately there does not appear to be any signs of active infection at this time. Overall I been very pleased with where things seem to be currently as far as the wound healing is concerned. Again this is first a lot of seeing him Dr. Leanord Hawking readmitted him last week. Nonetheless I think we are definitely making some progress here. 08/11/2021 upon evaluation today patient's wound on the heel actually showing signs of excellent improvement. I am actually very pleased with where things stand currently. No fevers, chills, nausea, vomiting, or  diarrhea. There does  not appear to be any need for sharp debridement today either which is also great news. 08/25/2021 upon evaluation today patient appears to be doing well with regard to his heel ulcer. This is actually looking significantly improved compared to last time I saw him. Fortunately there does not appear to be any evidence of active infection at this time. No fevers, chills, nausea, vomiting, or diarrhea. 09/01/2021 upon evaluation today patient appears to be doing decently well in regard to his wounds. Fortunately there does not appear to be any signs of active infection at this time which is great news and overall very pleased in that regard. I do not see any evidence of active infection systemically which is great news as well. No fevers, chills, nausea, vomiting, or diarrhea. I think that the heel is making excellent progress. 09/15/2021 upon evaluation today patient's heel unfortunately is significantly worse compared to what it was previous. I do believe that at this time he would benefit from switching back to something a little bit more offloading from the cushion shoe he has right now this is more of a heel protector what I really need is a Prevalon offloading boot which I think is good to do a lot better for him than just the small heel protector. 09/23/2021 upon evaluation today patient appears to be doing a little better in regards to last week's visit. Overall I think that he is making good progress which is great news and there does not appear to be any evidence of active infection at this time. No fevers, chills, nausea, vomiting, or diarrhea. 09/30/2021 upon evaluation patient's heel is actually showing signs of good improvement which is great news. Fortunately there does not appear to be any evidence of active infection locally nor systemically at this point. No fevers, chills, nausea, vomiting, or diarrhea. 10/07/2021 upon evaluation today patient appears to be doing well  currently in regard to his heel ulcer. He has been tolerating the dressing changes without complication. Fortunately I do not see any signs of active infection at this time which is great news. No fevers, chills, nausea, vomiting, or diarrhea. 12/27; wound is measuring smaller. We have been using silver alginate 10/28/2021 upon evaluation today patient appears to be doing well currently in regard to his heel ulcer. I am actually very pleased with where things stand and I think he is making excellent progress. Fortunately I do not see any signs of active infection locally nor systemically at this point which is great news. Nonetheless I do believe that the patient would benefit from a new offloading shoe and has been using it Velcro is no longer functioning properly and he tells me that he almost tripped and fell because of that today. Obviously I do not want him following that would be very bad. 11/11/2021 upon evaluation today patient appears to be doing excellent in regard to his wound. In fact this appears to be completely healed based on what I see currently. I do not see any signs of anything open or draining and I did double check just by clearing away some of the callus around the edges of the wound to ensure that there was nothing still open or hiding underneath. Once this was cleared away it appeared that the patient was doing significantly better at this time which is great news and there was nothing actually open. Readmission: 06-19-2022 upon evaluation today patient appears to be doing well currently in regard to his heel ulcer all things considered. He unfortunately  is having a bit of breakdown in general as far as the heel is concerned not nearly as bad as what we noted last time he was here in the clinic. The good news is I do think that this should hopefully heal much more rapidly than what we noted last time. His past medical history has not changed he is on Coumadin. 07-03-2022 upon  evaluation today patient appears to be doing better in regard to his heel. We will start to see some improvement here which is good news. Fortunately I do not see any evidence of infection locally or systemically at this time which is great news. No fevers, chills, nausea, vomiting, or diarrhea. 07-10-2022 upon evaluation today patient appears to be doing well currently in regard to his wound from the callus standpoint around the edges. Unfortunately from an actual wound bed standpoint he has some deep tissue injury noted at this point. Again I am not sure exactly what is going on that is causing this pressure to the region but he is definitely gotten pressure that is occurring and causing this to not heal as effectively as what we would like to see. I discussed with him today that he may need to at night when he sleeping use a pillow up underneath his calf in order to prevent the heel from touching the bed at all. I also think that it may be beneficial for him to monitor throughout his day and make sure there is no other time when he is getting the pressure to the heel. 07-23-2022 upon evaluation today patient appears to be doing poorly currently in regard to his heel ulcer. He has been tolerating the dressing changes without complication. Unfortunately I feel like he may be infected based on what I am seeing. [name], [name] ([id number]) [id number] Page 10 of 13 07-28-2022 I did review patient's culture results which showed positive for Proteus as well as Staphylococcus both of which would be treated with the Bactrim DS that I gave him at the last visit. This is good news and hopefully means that we should be able to apply the cast today as long as the wound itself does not appear to be doing poorly. Patient's wound bed actually showed signs of doing quite well and I am very pleased with where things stand and I do not see any signs of worsening overall which is great  news. 08-04-2022 upon evaluation today patient appears to be doing well currently in regard to his heel ulcer. I am actually very pleased with where things stand and I do think this is looking significantly better. With regard to his right shin that is also showing signs of improvement which is great news. 08-10-2022 upon evaluation today patient's wound on the heel actually appears to be doing significantly better. In regard to the right anterior shin this is showing signs of healing it might even be completely healed but I still get a monitor I cannot get anything to fill away but also could not see where there was anything actually draining at this point. I am tending to think healed but I want a monitor 1 more week before I call it for sure. 08-17-2022 upon evaluation today patient appears to be doing well currently in regard to his heel. Fortunately he is tolerating the dressing changes without complication. I do not see any signs of infection and overall I think he is making good progress here. We did get approval for the Apligraf but I think he  had a $295 co-pay that would have to be paid per application. With that being said as good as he is doing right now I do not think that is necessary but is still an option depending on how things progress if we need to speed this up quite a bit. 08/24/2022; this patient has a wound on his left heel in the setting of type 2 diabetes. We have been using Prisma. He has a heel offloading boot 08-31-2022 upon evaluation today patient appears to be doing poorly in regard to his heel ulcer which is still showing signs of bruising I am just not as happy as I was when I saw him 2 weeks ago with this. He saw Dr. Leanord Hawking last week. Also did not look good at that point apparently. Nonetheless I think that the patient is still continuing to have some counterpressure getting to the wound bed unfortunately. 09-07-2022 upon evaluation today patient appears to be doing well  currently in regard to his heel ulcer. He has been tolerating the dressing changes without complication. Fortunately there does not appear to be any signs of active infection locally or systemically at this time. Fortunately I do not see any evidence of active infection at this time. 09-15-2022 upon evaluation today patient appears to be doing well currently in regard to his legs which in fact appear to be completely healed this is great news. With that being said his heel does have some erythema and warmth as well as drainage I am concerned about cellulitis at this location. 09-21-2022 upon evaluation today patient's wound is actually showing signs of being a little bit smaller but still we are not doing nearly as well as what I would like to see. Fortunately I do not see any signs of infection at this time which is great news and overall I am extremely pleased in that regard. With that being said I do think that he needs to continue to elevate his leg although the edema is under great control with a compression wrap I think switching to a different dressing topically may be beneficial. My suggestion is to switch him over to a Iodoflex dressing. 09-28-2022 upon evaluation today patient appears to be doing well with regard to his heel ulcer. This is actually showing signs of improvement which is great news and overall I am extremely pleased with where we stand today. 10-06-2022 upon evaluation today patient actually appears to be making excellent progress at this point. Fortunately there does not appear to be any signs of active infection locally nor systemically which is great news and overall I am extremely pleased with where we stand. I do feel like he is actually making some pretty good progress here as we switch to the wrap along with the Iodoflex. 12/26; patient has a wound on the tip of his left heel. This is not a weightbearing surface. He is using a heel offloading boot and carefully offloading  this at other times during the day. He is using Iodoflex and Zetuvitunder 3 layer compression 10-22-2022 upon evaluation today patient appears to be doing well currently in regard to his wound. He has been tolerating the dressing changes without complication. Fortunately there does not appear to be any signs of active infection locally nor systemically at this time. No fevers, chills, nausea, vomiting, or diarrhea. 08-30-2023 upon evaluation today patient appears to be doing well currently in regard to his heel ulcer which is showing signs of good improvement. Fortunately I do not see any  evidence of infection locally nor systemically which is great news and overall I am extremely pleased with where we stand. No fevers, chills, nausea, vomiting, or diarrhea. Unfortunately he does have a wound on the side of his fifth toe where I think this did rub on the shoe. 11-05-2022 upon evaluation today patient still still showing some signs of pressure at this point and there may have even been some fluid collection at this time. Fortunately I do not see any evidence of active infection locally nor systemically which is great news and overall I am extremely pleased with where we stand. No fevers, chills, nausea, vomiting, or diarrhea. 1/25; the patient's area on the dorsal aspect of the left fifth toe is healed. We have been using Iodoflex to the area on the left heel. The lateral wound is slightly down in length. 11-19-2022 upon evaluation today patient appears to be doing well currently in regard to his wound. He has been tolerating the dressing changes without complication. Fortunately there does not appear to be any signs of infection we been using Iodoflex up to this point. 11-26-2022 upon evaluation today patient appears to be doing poorly currently in regard to his wound. He has been tolerating the dressing changes with the Iodoflex without complication. With that being said he does not have any signs of  active infection systemically though locally I do feel like there is some evidence of infection here. 12-03-2022 upon evaluation today patient appears to be doing well currently in regard to his wound this is better with the Bactrim compared to last week still there was some significant slough and biofilm buildup which is going require sharp debridement at this point. Fortunately I do not see any signs of active infection locally nor systemically which is great news. 12-11-2022 upon evaluation today patient actually appears to be doing significantly better at this time in regard to his heel. I am actually very pleased with where things stand currently. There is no signs of active infection locally or systemically at this point. 12-17-2022 upon evaluation today patient appears to be doing better as far as the overall appearance of his wound. I am actually very pleased in that regard. Fortunately there does not appear to be any signs of active infection locally nor systemically at this time. I do feel like that the patient is showing signs of excellent improvement in general. Nonetheless he needs something better for compression I think he should go ahead and see about getting compression socks. 3/7; tip of the left heel measurements are smaller he has been using Hydrofera Blue 01-07-2023 upon evaluation today patient appears to be doing well currently in regard to his wound. This has not been debrided since I last saw him and it definitely shows that definitely needs some debridement today. Fortunately I do not see any evidence of active infection locally nor systemically which is great news. 01-14-2023 upon evaluation today patient appears to be doing well currently although he does have some erythema and warmth to the heel I feel like he may have developed an infection yet again. This is something that is been ongoing issue we have been trying to manage his foot best we could. With that being said I do  think he may need to go back on the Bactrim this has done well for him in the past. 01-21-2023 upon evaluation today patient appears to be doing better in regard to his heel. He is doing very well with the antibiotics and that is  made a big difference for him based on what I am seeing I am very pleased with where we stand today. He is obviously doing extremely well which is great news. [name], [name] ([id number]) [id number] Page 11 of 13 01-28-2023 upon evaluation today patient appears to be doing well currently in regard to his wound. This is actually showing signs of excellent improvement I am actually very pleased with where we stand and I do believe that he is making good progress here. Fortunately I do not see any evidence of active infection locally nor systemically which is great news. No fevers, chills, nausea, vomiting, or diarrhea. 02-04-2023 upon evaluation today patient's wound is actually showed signs of excellent improvement I am actually very pleased with where we stand. I do not see any signs of active infection locally nor systemically which is great news and in general I do believe that we are moving in the right direction here. No fevers, chills, nausea, vomiting, or diarrhea. 02-11-2023 upon evaluation patient's wound actually showing signs of excellent improvement I am actually very pleased with where we stand I think that his pain is better there is no signs of infection we will continue to use the gentamicin and overall we are making excellent progress. 02-18-2023 upon evaluation today patient appears to be doing well currently in regard to his wound. He has been tolerating the dressing changes without complication. Fortunately I do not see any signs of active infection locally nor systemically which is great news. 02-25-2023 upon evaluation today patient's heel actually showed signs of excellent improvement. I am actually very pleased with where we stand  today and I feel like that he is making really good progress. Fortunately I do not see any signs of active infection which is good news. No fevers, chills, nausea, vomiting, or diarrhea. 03-04-2023 upon evaluation today patient appears to be doing pretty well currently in regard to his heel. He has been tolerating the dressing changes without complication. Fortunately there does not appear to be any signs of active infection locally nor systemically which is great news. No fevers, chills, nausea, vomiting, or diarrhea. 03-11-2023 upon evaluation today patient appears to be doing poorly in regard to his heel ulcer. He tells me he has been having to sleep on his back to having trouble breathing therefore I think he has been having some issues here as far as the heel is concerned it looks like he has a deep tissue injury yet again at this point. Fortunately I do not see any signs of infection which is good news but at the same time I do feel like that he is doing worse than where he was previous. I do not see any signs of active infection systemically which is great news. 03-18-2023 upon evaluation today patient appears to be doing well currently in fact the heel is significantly better compared to last week. I am extremely pleased with where we are seeing I am hopeful that he will continue to show signs of improvement going forward. 03-25-2023 upon evaluation today patient appears to be doing well currently in regard to his wounds. He is actually showing signs of improvement on the heel and I am very pleased in that regard. Fortunately I do not see any signs of active infection at this time which is great news. No fevers, chills, nausea, vomiting, or diarrhea. 04-05-2023 upon evaluation today patient's wound is actually showing signs of good improvement. Actually very pleased with where things stand today. Fortunately there does  not appear to be any signs of active infection locally or systemically which is  great news. 04-19-2023 upon evaluation today patient's wound actually looks the best it has in quite some time. He did have a wrap on for 2 weeks which I think is caused a little bit of irritation going to the forefoot everything else looks to be doing well the wrap did slide down a little bit as well. Fortunately I do not see any signs of infection however I do think that in the future if he has any issues with not being able to get scheduled to see me he should definitely make an appointment at least for a nurse visit he is aware and in agreement with plan going forward. 04-26-2023 upon evaluation today patient's wound is actually significantly smaller this is great news he is making good progress in the past really 5 or 6 visits we have been seeing improvement week by week I am very pleased in that regard. Objective Constitutional Well-nourished and well-hydrated in no acute distress. Vitals Time Taken: 1:48 AM, Height: 70 in, Weight: 265 lbs, BMI: 38, Temperature: 98.4 F, Pulse: 67 bpm, Respiratory Rate: 18 breaths/min, Blood Pressure: 138/64 mmHg. Respiratory normal breathing without difficulty. Psychiatric this patient is able to make decisions and demonstrates good insight into disease process. Alert and Oriented x 3. pleasant and cooperative. General Notes: Upon inspection patient's wound bed actually showed signs of good granulation epithelization at this point. I would recommend that we continue with the wound care measures as before he is in agreement with plan we will see where things stand at follow-up. I did continue to improve this area and this is doing really well at this point. I do believe that subsequently postdebridement this looks much better and he is definitely moving in the right direction towards closure. Integumentary (Hair, Skin) Wound #10 status is Open. Original cause of wound was Gradually Appeared. The date acquired was: 06/05/2022. The wound has been in treatment 44  weeks. The wound is located on the Left Calcaneus. The wound measures 0.4cm length x 1.4cm width x 0.2cm depth; 0.44cm^2 area and 0.088cm^3 volume. There is Fat Layer (Subcutaneous Tissue) exposed. There is a medium amount of serosanguineous drainage noted. The wound margin is flat and intact. There is small (1-33%) pink granulation within the wound bed. There is a large (67-100%) amount of necrotic tissue within the wound bed including Adherent Slough. 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 There was a Excisional Skin/Subcutaneous Tissue Debridement with a total area of 0.44 sq cm performed by [name] Derry, PA-C. With the following instrument(s): Curette to remove Viable and Non-Viable tissue/material. Material removed includes Callus, Subcutaneous Tissue, and Slough. A time out was conducted at 14:16, prior to the start of the procedure. A Minimum amount of bleeding was controlled with Pressure. The procedure was tolerated well. Post Debridement Measurements: 0.4cm length x 1.4cm width x 0.2cm depth; 0.088cm^3 volume. Character of Wound/Ulcer Post Debridement is stable. Severity of Tissue Post Debridement is: Fat layer exposed. Post procedure Diagnosis Wound #10: Same as Pre-Procedure Pre-procedure diagnosis of Wound #10 is a Diabetic Wound/Ulcer of  the Lower Extremity located on the Left Calcaneus . There was a Double Layer Compression Therapy Procedure with a pre-treatment ABI of 1 by Betha Loa. Post procedure Diagnosis Wound #10: Same as  Pre-Procedure Plan Follow-up Appointments: Wound #10 Left Calcaneus: Return Appointment in 1 week. Bathing/ Shower/ Hygiene: May shower; gently cleanse wound with antibacterial soap, rinse and pat dry prior to dressing wounds Non-Wound Condition: Additional non-wound orders/instructions: - Apply 40% Urea cream to dry cracked skin on feet. Off-Loading: Open toe surgical shoe WOUND #10: - Calcaneus Wound Laterality: Left Topical: Mupirocin Ointment 3 x Per Week/30 Days Discharge Instructions: Apply as directed by provider. Prim Dressing: Hydrofera Blue Ready Transfer Foam, 2.5x2.5 (in/in) (Dispense As Written) 3 x Per Week/30 Days ary Discharge Instructions: Apply Hydrofera Blue Ready to wound bed as directed Secondary Dressing: ABD Pad 5x9 (in/in) 3 x Per Week/30 Days Discharge Instructions: Cover with ABD pad Com pression Wrap: Urgo K2 Lite, two layer compression system, regular 3 x Per Week/30 Days 1. I would recommend that we have the patient continue to monitor for any signs of infection or worsening. Obviously if anything changes he knows contact the office let me know. 2. Also can recommend the patient should continue with the [facility] as well as the ABD pad to cover. 3. I am also going to continue with the Urgo K2 lite compression wrap. We will see patient back for reevaluation in 1 week here in the clinic. If anything worsens or changes patient will contact our office for additional recommendations. Electronic Signature(s) Signed: 04/26/2023 2:33:01 PM By: [name] Derry PA-C Entered By: [name] Derry on 04/26/2023 14:33:01 -------------------------------------------------------------------------------- SuperBill Details Patient Name: Date of Service: [name], [name] [name] J. 04/26/2023 Medical Record Number: [id number] Patient Account Number: [id number] Date of Birth/Sex: Treating RN: 12/02/1945 (76 y.o. [name]) Euell, Schiff ([id number])  [id number] Page 13 of 13 Primary Care Provider: [name] Other Clinician: Betha Loa Referring Provider: Treating Provider/Extender: [name] in Treatment: 44 Diagnosis Coding ICD-10 Codes Code Description E11.621 Type 2 diabetes mellitus with foot ulcer 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sed E11.42 Type 2 diabetes mellitus with diabetic polyneuropathy Facility Procedures : CPT4 Code: [id number] Description: 11042 - DEB SUBQ TISSUE 20 SQ CM/< ICD-10 Diagnosis Description 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sed Modifier: Quantity: 1 Physician Procedures : CPT4 Code Description Modifier [id number] 11042 - WC PHYS SUBQ TISS 20 SQ CM ICD-10 Diagnosis Description 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sed Quantity: 1 Electronic Signature(s) Signed: 04/26/2023 2:33:17 PM By: [name] Derry PA-C Entered By: [name] Derry on 04/26/2023 14:33:16

## 2023-04-30 ENCOUNTER — Ambulatory Visit: Payer: Medicare HMO | Admitting: Physician Assistant

## 2023-05-03 ENCOUNTER — Encounter: Payer: Medicare HMO | Admitting: Physician Assistant

## 2023-05-03 DIAGNOSIS — E11621 Type 2 diabetes mellitus with foot ulcer: Secondary | ICD-10-CM | POA: Diagnosis not present

## 2023-05-03 NOTE — Progress Notes (Signed)
STROTHER, EVERITT ([id number]) [id number] Page 1 of 9 Visit Report for 04/26/2023 Arrival Information Details Patient Name: Date of Service: [name], [name] [facility] [name] J. 04/26/2023 1:45 PM Medical Record Number: [id number] Patient Account Number: [id number] Date of Birth/Sex: Treating RN: 09/10/1946 (76 y.o. [name]) [name] Primary Care Grasiela Jonsson: [name] Other Clinician: Betha Loa Referring Lolamae Voisin: Treating Markiesha Delia/Extender: [name] in Treatment: 13 Visit Information History Since Last Visit All ordered tests and consults were completed: No Patient Arrived: [name] Added or deleted any medications: No Arrival Time: 13:42 Any new allergies or adverse reactions: No Transfer Assistance: None Had a fall or experienced change in No Patient Identification Verified: Yes activities of daily living that may affect Secondary Verification Process Completed: Yes risk of falls: Patient Requires Transmission-Based Precautions: No Signs or symptoms of abuse/neglect since No Patient Has Alerts: Yes last visito Patient Alerts: Patient on Blood Thinner Hospitalized since last visit: No Warfarin Implantable device outside of the clinic No Type II Diabetic excluding cellular tissue based products placed in the [name] since last visit: Has Dressing in Place as Prescribed: Yes Has Compression in Place as Prescribed: Yes Has Footwear/Offloading in Place as Yes Prescribed: Left: Surgical Shoe with Pressure Relief Insole Pain Present Now: No Electronic Signature(s) Signed: 05/03/2023 4:36:38 PM By: Betha Loa Entered By: Betha Loa on 04/26/2023 13:47:55 -------------------------------------------------------------------------------- Clinic Level of Care Assessment Details Patient Name: Date of Service: [name] [place] J. 04/26/2023 1:45 PM Medical Record Number: [id number] Patient Account Number: [id number] Date of Birth/Sex:  Treating RN: 04/17/1946 (76 y.o. [name]) [name] Primary Care Shanira Tine: [name] Other Clinician: Betha Loa Referring Rosalva Neary: Treating Aleigh Grunden/Extender: [name] in Treatment: 44 Clinic Level of Care Assessment Items [name], [name] ([id number]) [id number] Page 2 of 9 TOOL 1 Quantity Score []  - 0 Use when EandM and Procedure is performed on INITIAL visit ASSESSMENTS - Nursing Assessment / Reassessment []  - 0 General Physical Exam (combine w/ comprehensive assessment (listed just below) when performed on new pt. evals) []  - 0 Comprehensive Assessment (HX, ROS, Risk Assessments, Wounds Hx, etc.) ASSESSMENTS - Wound and Skin Assessment / Reassessment []  - 0 Dermatologic / Skin Assessment (not related to wound area) ASSESSMENTS - Ostomy and/or Continence Assessment and Care []  - 0 Incontinence Assessment and Management []  - 0 Ostomy Care Assessment and Management (repouching, etc.) PROCESS - Coordination of Care []  - 0 Simple Patient / Family Education for ongoing care []  - 0 Complex (extensive) Patient / Family Education for ongoing care []  - 0 Staff obtains Chiropractor, Records, T Results / Process Orders est []  - 0 Staff telephones HHA, Nursing Homes / Clarify orders / etc []  - 0 Routine Transfer to another Facility (non-emergent condition) []  - 0 Routine Hospital Admission (non-emergent condition) []  - 0 New Admissions / Manufacturing engineer / Ordering NPWT Apligraf, etc. , []  - 0 Emergency Hospital Admission (emergent condition) PROCESS - Special Needs []  - 0 Pediatric / Minor Patient Management []  - 0 Isolation Patient Management []  - 0 Hearing / Language / Visual special needs []  - 0 Assessment of Community assistance (transportation, D/C planning, etc.) []  - 0 Additional assistance / Altered mentation []  - 0 Support Surface(s) Assessment (bed, cushion, seat, etc.) INTERVENTIONS -  Miscellaneous []  - 0 External ear exam []  - 0 Patient Transfer (multiple staff / Nurse, adult / Similar devices) []  - 0 Simple Staple / Suture removal (25 or less) []  - 0 Complex Staple / Suture removal (  26 or more) []  - 0 Hypo/Hyperglycemic Management (do not check if billed separately) []  - 0 Ankle / Brachial Index (ABI) - do not check if billed separately Has the patient been seen at the hospital within the last three years: Yes Total Score: 0 Level Of Care: ____ Electronic Signature(s) Signed: 05/03/2023 4:36:38 PM By: Betha Loa Entered By: Betha Loa on 04/26/2023 14:21:51 [name] ([id number]) [id number] Page 3 of 9 -------------------------------------------------------------------------------- Compression Therapy Details Patient Name: Date of Service: [name], [name] [name] 04/26/2023 1:45 PM Medical Record Number: [id number] Patient Account Number: [id number] Date of Birth/Sex: Treating RN: 01/08/1946 (76 y.o. [name]) [name] Primary Care Nada Godley: [name] Other Clinician: Betha Loa Referring Inger Wiest: Treating Caroljean Monsivais/Extender: Hermine Messick Weeks in Treatment: 10 Compression Therapy Performed for Wound Assessment: Wound #10 Left Calcaneus Performed By: Farrel Gordon, Angie, Compression Type: Double Layer Pre Treatment ABI: 1 Post Procedure Diagnosis Same as Pre-procedure Electronic Signature(s) Signed: 05/03/2023 4:36:38 PM By: Betha Loa Entered By: Betha Loa on 04/26/2023 14:16:23 -------------------------------------------------------------------------------- Encounter Discharge Information Details Patient Name: Date of Service: [name], [name] [name] 04/26/2023 1:45 PM Medical Record Number: [id number] Patient Account Number: [id number] Date of Birth/Sex: Treating RN: 13-Feb-1946 (76 y.o. [name] Primary Care Tarnesha Ulloa: [name] Other Clinician: Betha Loa Referring  Arijana Narayan: Treating Trenten Watchman/Extender: [name] in Treatment: 50 Encounter Discharge Information Items Post Procedure Vitals Discharge Condition: Stable Temperature (F): 98.4 Ambulatory Status: Walker Pulse (bpm): 67 Discharge Destination: Home Respiratory Rate (breaths/min): 18 Transportation: Private Auto Blood Pressure (mmHg): 138/64 Accompanied By: self Schedule Follow-up Appointment: Yes Clinical Summary of Care: Electronic Signature(s) Signed: 05/03/2023 4:36:38 PM By: Betha Loa Entered By: Betha Loa on 04/26/2023 15:07:37 -------------------------------------------------------------------------------- Lower Extremity Assessment Details Patient Name: Date of Service: [name], [name] [name] 04/26/2023 1:45 PM [name] ([id number]) [id number] Page 4 of 9 Medical Record Number: [id number] Patient Account Number: [id number] Date of Birth/Sex: Treating RN: 27-Oct-1945 (76 y.o. [name]) [name] Primary Care Altie Savard: [name] Other Clinician: Betha Loa Referring Mackenzy Eisenberg: Treating Senai Kingsley/Extender: Hermine Messick Weeks in Treatment: 44 Edema Assessment Assessed: [Left: Yes] [Right: No] Edema: [Left: Ye] [Right: s] Calf Left: Right: Point of Measurement: 33 cm From Medial Instep 38.5 cm Ankle Left: Right: Point of Measurement: 11 cm From Medial Instep 21.5 cm Vascular Assessment Pulses: Dorsalis Pedis Palpable: [Left:Yes] Electronic Signature(s) Signed: 04/29/2023 1:44:50 PM By: [name] RN Signed: 05/03/2023 4:36:38 PM By: Betha Loa Entered By: Betha Loa on 04/26/2023 14:07:03 -------------------------------------------------------------------------------- Multi Wound Chart Details Patient Name: Date of Service: [name], [name] [name] 04/26/2023 1:45 PM Medical Record Number: [id number] Patient Account Number: [id number] Date of Birth/Sex: Treating RN: August 22, 1946 (76  y.o. 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 N/A N/A Secondary Etiology: Cataracts, Arrhythmia, Coronary N/A N/A Comorbid History: Artery Disease, Hypertension, Type II Diabetes, Osteoarthritis, Neuropathy 06/05/2022 N/A N/A Date Acquired: 63 N/A N/A Weeks of Treatment: Open N/A N/A Wound Status: No N/A N/A Wound Recurrence: 0.4x1.4x0.2 N/A N/A Measurements L x W x D (cm) 0.44 N/A N/A A (  cm) : rea 0.088 N/A N/A Volume (cm) : 68.90% N/A N/A % Reduction in A rea: 68.90% N/A N/A % Reduction in Volume: Grade 1 N/A N/A Classification: Medium N/A N/A Exudate A mount: Serosanguineous N/A N/A Exudate Type: red, brown N/A N/A Exudate Color: Flat and Intact N/A N/A Wound Margin: Small (1-33%) N/A N/A Granulation A mount: Pink N/A N/A Granulation Quality: Large (67-100%) N/A N/A Necrotic A mount: Fat Layer (Subcutaneous Tissue): Yes N/A N/A Exposed Structures: Fascia: No Tendon: No Muscle: No Joint: No Bone: No Small (1-33%) N/A N/A Epithelialization: Treatment Notes Electronic Signature(s) Signed: 05/03/2023 4:36:38 PM By: Betha Loa Entered By: Betha Loa on 04/26/2023 14:07:09 -------------------------------------------------------------------------------- Multi-Disciplinary Care Plan Details Patient Name: Date of Service: [name], [name] [name] 04/26/2023 1:45  PM Medical Record Number: [id number] Patient Account Number: [id number] Date of Birth/Sex: Treating RN: 20-Oct-1945 (76 y.o. [name]) [name] Primary Care Avanti Jetter: [name] Other Clinician: Betha Loa Referring Lionardo Haze: Treating Makhai Fulco/Extender: [name] in Treatment: 44 Active Inactive Necrotic Tissue Nursing Diagnoses: Impaired tissue integrity related to necrotic/devitalized tissue Knowledge deficit related to management of necrotic/devitalized tissue Goals: Necrotic/devitalized tissue will be minimized in the wound bed Date Initiated: 03/04/2023 Target Resolution Date: 03/18/2023 Goal Status: Active Patient/caregiver will verbalize understanding of reason and process for debridement of necrotic tissue Date Initiated: 03/04/2023 Target Resolution Date: 03/04/2023 Goal Status: Active TIMOTHEY, DAHLSTROM ([id number]) [id number] Page 6 of 9 Interventions: Assess patient pain level pre-, during and post procedure and prior to discharge Provide education on necrotic tissue and debridement process Treatment Activities: Excisional debridement : 03/04/2023 Notes: Wound/Skin Impairment Nursing Diagnoses: Impaired tissue integrity Goals: Patient/caregiver will verbalize understanding of skin care regimen Date Initiated: 06/19/2022 Date Inactivated: 07/10/2022 Target Resolution Date: 06/19/2022 Goal Status: Met Ulcer/skin breakdown will have a volume reduction of 30% by week 4 Date Initiated: 06/19/2022 Date Inactivated: 07/28/2022 Target Resolution Date: 07/17/2022 Goal Status: Met Ulcer/skin breakdown will have a volume reduction of 50% by week 8 Date Initiated: 07/28/2022 Target Resolution Date: 03/20/2023 Goal Status: Active Interventions: Assess patient/caregiver ability to obtain necessary supplies Assess patient/caregiver ability to perform ulcer/skin care regimen upon admission and as needed Assess ulceration(s) every  visit Provide education on ulcer and skin care Treatment Activities: Skin care regimen initiated : 06/19/2022 Topical wound management initiated : 06/19/2022 Notes: Electronic Signature(s) Signed: 04/29/2023 1:44:50 PM By: [name] RN Signed: 05/03/2023 4:36:38 PM By: Betha Loa Entered By: Betha Loa on 04/26/2023 15:06:16 -------------------------------------------------------------------------------- Pain Assessment Details Patient Name: Date of Service: [name], [name] [name] 04/26/2023 1:45 PM Medical Record Number: [id number] Patient Account Number: [id number] Date of Birth/Sex: Treating RN: Jan 29, 1946 (76 y.o. [name] Primary Care Cobey Raineri: [name] Other Clinician: Betha Loa Referring Llana Deshazo: Treating [name]/Extender: [name] in Treatment: 44 Active Problems Location of Pain Severity and Description of Pain Patient Has Paino No Site Locations [name], [name] ([id number]) [id number] Page 7 of 9 Pain Management and Medication Current Pain Management: Electronic Signature(s) Signed: 04/29/2023 1:44:50 PM By: [name] RN Signed: 05/03/2023 4:36:38 PM By: Betha Loa Entered By: Betha Loa on 04/26/2023 13:52:59 -------------------------------------------------------------------------------- Patient/Caregiver Education Details Patient Name: Date of Service: [name], [name] 7/8/2024andnbsp1:45 PM Medical Record Number: [id number] Patient Account Number: [id number] Date of Birth/Gender: Treating RN: [birth year] (76 y.o. [name] Primary Care Physician: [name] Other Clinician: Betha Loa Referring Physician: Treating Physician/Extender: [name] in Treatment: 35 Education Assessment Education Provided To: Patient Education Topics  Provided Wound/Skin Impairment: Handouts: Other: continue wound care as directed Methods:  Explain/Verbal Responses: State content correctly Electronic Signature(s) Signed: 05/03/2023 4:36:38 PM By: Betha Loa Entered By: Betha Loa on 04/26/2023 15:06:42 [name] ([id number]) [id number] Page 8 of 9 -------------------------------------------------------------------------------- Wound Assessment Details Patient Name: Date of Service: [name], [name] [name] 04/26/2023 1:45 PM Medical Record Number: [id number] Patient Account Number: [id number] Date of Birth/Sex: Treating RN: 04-Sep-1946 (76 y.o. [name]) [name] Primary Care Elma Limas: [name] Other Clinician: Betha Loa Referring Kadesha Virrueta: Treating [name]/Extender: Hermine Messick Weeks in Treatment: 44 Wound Status Wound Number: 10 Primary Diabetic Wound/Ulcer of the Lower Extremity Etiology: Wound Location: Left Calcaneus Secondary Pressure Ulcer Wounding Event: Gradually Appeared Etiology: Date Acquired: 06/05/2022 Wound Open Weeks Of Treatment: 44 Status: Clustered Wound: No Comorbid Cataracts, Arrhythmia, Coronary Artery Disease, Hypertension, History: Type II Diabetes, Osteoarthritis, Neuropathy Photos Wound Measurements Length: (cm) 0.4 Width: (cm) 1.4 Depth: (cm) 0.2 Area: (cm) 0.44 Volume: (cm) 0.088 % Reduction in Area: 68.9% % Reduction in Volume: 68.9% Epithelialization: Small (1-33%) Wound Description Classification: Grade 1 Wound Margin: Flat and Intact Exudate Amount: Medium Exudate Type: Serosanguineous Exudate Color: red, brown Foul Odor After Cleansing: No Slough/Fibrino Yes Wound Bed Granulation Amount: Small (1-33%) Exposed Structure Granulation Quality: Pink Fascia Exposed: No Necrotic Amount: Large (67-100%) Fat Layer (Subcutaneous Tissue) Exposed: Yes Necrotic Quality: Adherent Slough Tendon Exposed: No Muscle Exposed: No Joint Exposed: No Bone Exposed: No Electronic Signature(s) Signed: 04/29/2023 1:44:50 PM By:  [name] RN Signed: 05/03/2023 4:36:38 PM By: Betha Loa Entered By: Betha Loa on 04/26/2023 14:06:01 [name] ([id number]) [id number] Page 9 of 9 -------------------------------------------------------------------------------- Vitals Details Patient Name: Date of Service: [name], [name] [name] 04/26/2023 1:45 PM Medical Record Number: [id number] Patient Account Number: [id number] Date of Birth/Sex: Treating RN: 1946/05/06 (76 y.o. [name]) [name] Primary Care Easten Maceachern: [name] Other Clinician: Betha Loa Referring Maryanna Stuber: Treating Kemonte Ullman/Extender: [name] in Treatment: 78 Vital Signs Time Taken: 01:48 Temperature (F): 98.4 Height (in): 70 Pulse (bpm): 67 Weight (lbs): 265 Respiratory Rate (breaths/min): 18 Body Mass Index (BMI): 38 Blood Pressure (mmHg): 138/64 Reference Range: 80 - 120 mg / dl Electronic Signature(s) Signed: 05/03/2023 4:36:38 PM By: Betha Loa Entered By: Betha Loa on 04/26/2023 13:52:44

## 2023-05-03 NOTE — Progress Notes (Addendum)
[name], [name] ([id number]) [id number] Page 1 of 13 Visit Report for 05/03/2023 Chief Complaint Document Details Patient Name: Date of Service: [name], GEO [facility] J. 05/03/2023 1:45 PM Medical Record Number: [id number] Patient Account Number: [id number] Date of Birth/Sex: Treating RN: [birth year] (77 y.o. [name]) [name] Primary Care Provider: [name] Other Clinician: Midge Aver Referring Provider: Treating Provider/Extender: [name] in Treatment: 45 Information Obtained from: Patient Chief Complaint Left heel ulcer Electronic Signature(s) Signed: 05/03/2023 1:19:32 PM By: [name] Derry PA-C Entered By: [name] Derry on 05/03/2023 13:19:32 -------------------------------------------------------------------------------- Debridement Details Patient Name: Date of Service: [name], [name] 05/03/2023 1:45 PM Medical Record Number: [id number] Patient Account Number: [id number] Date of Birth/Sex: Treating RN: May 17, 1946 (77 y.o. [name]) [name] Primary Care Provider: [name] Other Clinician: Betha Loa Referring Provider: Treating Provider/Extender: [name] in Treatment: 45 Debridement Performed for Assessment: Wound #10 Left Calcaneus Performed By: Physician [name] Derry, PA-C Debridement Type: Debridement Severity of Tissue Pre Debridement: Fat layer exposed Level of Consciousness (Pre-procedure): Awake and Alert Pre-procedure Verification/Time Out Yes - 14:20 Taken: Start Time: 14:20 Percent of Wound Bed Debrided: 100% T Area Debrided (cm): otal 0.39 Tissue and other material debrided: Viable, Non-Viable, Callus, Slough, Subcutaneous, Slough Level: Skin/Subcutaneous Tissue Debridement Description: Excisional Instrument: Curette Bleeding: Minimum Hemostasis Achieved: Pressure Response to Treatment: Procedure was tolerated well Level of Consciousness (Post- Awake and  Alert procedure): GABIEL, YANT ([id number]) [id number] Page 2 of 13 Post Debridement Measurements of Total Wound Length: (cm) 0.5 Width: (cm) 1 Depth: (cm) 0.1 Volume: (cm) 0.039 Character of Wound/Ulcer Post Debridement: Improved Severity of Tissue Post Debridement: Fat layer exposed Post Procedure Diagnosis Same as Pre-procedure Electronic Signature(s) Signed: 05/03/2023 4:35:54 PM By: Betha Loa Signed: 05/04/2023 5:59:11 PM By: [name] Derry PA-C Signed: 05/06/2023 3:42:23 PM By: [name] RN Entered By: Betha Loa on 05/03/2023 14:21:46 -------------------------------------------------------------------------------- HPI Details Patient Name: Date of Service: [name], [name] 05/03/2023 1:45 PM Medical Record Number: [id number] Patient Account Number: [id number] Date of Birth/Sex: Treating RN: [name] 05, 1946 (77 y.o. [name] Primary Care Provider: [name] Other Clinician: Midge Aver Referring Provider: Treating Provider/Extender: [name] in Treatment: 45 History of Present Illness HPI Description: 09/24/2020 on evaluation today patient presents today for a heel ulcer that he tells me has been present for about 2 years. He has been seeing podiatry and they have been attempting to manage this including what sounds to be a total contact cast, Unna boot, and just standard dressings otherwise as well. Most recently has been using triple antibiotic ointment. With that being said unfortunately despite everything he really has not had any significant improvement. He tells me that he cannot even really remember exactly how this began but he presumed it may have rubbed on his shoes or something of that nature. 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 He tells me that this does seem to help. Fortunately there is no signs of active infection at this time. His most recent hemoglobin A1c was 8.1 though he knows this was this year he cannot tell me the exact time. His fluid pills currently to help with some of the lower  extremity edema although he does obviously have signs of venous stasis/lymphedema. Currently there is no evidence of active infection. No fevers, chills, nausea, vomiting, or diarrhea. Patient has had fairly recent ABIs which were performed on 07/19/2020 and revealed that he has normal findings in both the ankle and toe locations bilaterally. His ABI on the right was 1.09 on the left was 1.08 with a TBI on the right of 0.88 and on the left of 0.94. Triphasic flow was noted throughout. 10/08/2020 on evaluation today patient appears to be doing pretty well in regard to his left heel currently in fact this is doing a great job and seems to be healing quite nicely. Unfortunately on his right leg he had a pile of wood that actually fell on him injuring his right leg this is somewhat erythematous has me concerned little bit about cellulitis though there is not really a good area to culture at this point. 10/24/2020 upon evaluation today patient appears to be doing well with regard to his heel ulcer. He is showing signs of improvement which is great news. His right leg is completely healed. Overall I feel like he is doing excellent and there is no signs of infection. 11/07/2020 upon evaluation today patient appears to be doing well with regard to his heel ulcer. He tells me that last week when he was unable to come in his wife actually thought that the wound was very close to closing if not closed. Then it began to "reopen again". I really feel like what may have happened as the collagen may have dried over the wound bed  and that because that misunderstanding with thinking that the wound was healing. With that being said I did not see it last week I do not know that for certain. Either way I feel like he is doing great today I see no signs of infection at this point. 11/26/2020 upon inspection today patient appears to be doing decently well in regard to his heel ulcer. He has been tolerating the dressing changes without complication. Fortunately there is no sign of active infection at this time. No fevers, chills, nausea, vomiting, or diarrhea. 12/10/2020 upon evaluation today patient appears to be doing fairly well in regard to the wound on his heel as well as what appears to be a new wound of the left first metatarsal head plantar aspect. This seems to be an area that was callus that has split as the patient tells me has been trying to walk on his toes more has probably where this came from. With that being said there does not appear to be signs of active infection which is great news. 12/17/2020 upon evaluation today patient actually appears to be making good progress currently. Fortunately there is no evidence of active infection at this time. Overall I feel like he is very close to complete closure. 12/26/2020 upon evaluation today patient appears to be doing excellent in regard to his wounds. In fact I am not certain that these are not even completely healed on initial inspection. Overall I am very pleased with where things stand today. Good news is that they are healed he is actually get ready to go out of FARIS, SOLLARS ([id number]) [id number] Page 3 of 73 town and that will be helpful as well as he will be a full part of the time. Readmission: 02/04/2021 upon evaluation today patient appears to be doing well at this point in regard to his left heel that I previously saw him  for. Unfortunately he is having issues with his right lower extremity. He has significant wounds at this point he  also has erythema noted there is definite signs of cellulitis which is unfortunate. With that being said I think we do need to address this sooner rather than later. The good news is he did have a nice trip to the beach. He tells me that he had no issues during that time. 4/27; patient on Bactrim. Culture showed methicillin sensitive staph aureus therefore the Bactrim should be effective. 2 small areas on the right leg are healed the area on the mid aspect of the tibia almost 100% covered in a very adherent necrotic debris. We have been using silver alginate 02/20/2021 upon evaluation today patient appears to be doing well with regard to his wound. He is showing signs of improvement which is great news overall very pleased with where things stand today. No fevers, chills, nausea, vomiting, or diarrhea. 02/27/2021 upon evaluation today patient appears to be doing well with regard to his leg ulcer. He is tolerating the dressing changes and overall appears to be doing quite excellent. I am extremely pleased with where things stand and overall I think patient is making great progress. There is no sign of active infection at this time which is also great news. 03/06/2021 upon evaluation today patient appears to be doing excellent in regard to his wounds. In fact he appears to be completely healed today based on what I am seeing. This is excellent news and overall I am extremely pleased with where he stands. Overall the patient is happy to hear this as well this has been quite sometime coming. Readmission: 04/01/2021 patient unfortunately returns for readmission today. He tells me that he has been wearing his compression socks on the right leg daily and he does not really know what is going on and why his legs are doing what they are doing. We did therefore go ahead and probe deeper into exactly what has been going on with him today. Subsequently the patient tells me that when he gets up and what we would call  "first thing in the morning" are really 2 different things". He does not tend to sleep well so he tells me that he will often wake up at 3:00 in the morning. He will then potentially going into the living room to get in his chair where he may read a book for a little while and then potentially fall asleep back in his chair. He then subsequently wake up around 5:00 or so and then get up and in his words "putter around". This often will end with him proceeding at some point around 8 AM or 9 AM to putting on his compression socks. With that being said this means that anywhere from the 3:00 in the morning till roughly around 9:00 in the morning he has no compression on yet he is sitting in his recliner, walking around and up and about, and this is at least 5 to 6 hours of noncompressed time. That may be our issue here but I did not realize until we discussed this further today. Fortunately there does not appear to be any signs of active infection at this time which is great news. With that being said he has multiple wounds of the bilateral lower extremities. 04/10/2021 upon evaluation today patient appears to be doing well with regard to his legs for the most part. He does look like he had some injury where his wrap slid down  nonetheless he did some "doctoring on them". I do feel like most of the areas honestly have cleared back up which is great news. There does not appear to be any signs of active infection at this time which is also great news. No fevers, chills, nausea, vomiting, or diarrhea. 04/18/2021 upon evaluation today patient appears to be doing well with regard to his wounds in fact he appears to be completely healed which is great news. Fortunately there does not appear to be any signs of active infection which is great news. No fevers, chills, nausea, vomiting, or diarrhea. READMISSION 07/28/2021 This is a now 77 year old man we have had in this clinic for quite a bit of this year. He has been  in here with bilateral lower extremity leg wounds probably secondary to chronic venous insufficiency. He wears compression stockings. 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 He comes in an old running shoes although he says he wears better shoes at home. His history is that he noticed a blister in the right posterior heel. This open. He has been applying Neosporin to it currently the wound measures 2 x 1.4 cm. 100% slough covered. Not really offloading this in any rigorous way. Past medical history is essentially unchanged he has paroxysmal atrial fibrillation on Coumadin type 2 diabetes with a recent hemoglobin A1c of 7 on metformin. ABI in our clinic was 0.98 on the right 08/05/2021 upon evaluation today patient appears to be doing well with regard to his heel ulcer. Fortunately there does not appear to be any signs of active infection at this time. Overall I been very pleased with where things seem to be currently as far as the wound healing is concerned. Again this is first a lot of seeing him Dr. Leanord Hawking readmitted him last week. Nonetheless I think we are definitely making some progress here. 08/11/2021 upon evaluation today patient's wound on the heel actually showing signs of excellent improvement. I am actually very pleased with where things stand currently. No fevers, chills, nausea, vomiting, or diarrhea. There does not appear to be any need for sharp debridement today either which is also great news. 08/25/2021 upon evaluation today patient appears to be doing well with regard to his heel ulcer. This is actually looking significantly improved compared to last time I saw him. Fortunately there does not appear to be any evidence of active infection at this time. No fevers, chills, nausea, vomiting, or diarrhea. 09/01/2021 upon evaluation today patient appears to be doing decently well in regard to his wounds. Fortunately there does not appear to be  any signs of active infection at this time which is great news and overall very pleased in that regard. I do not see any evidence of active infection systemically which is great news as well. No fevers, chills, nausea, vomiting, or diarrhea. I think that the heel is making excellent progress. 09/15/2021 upon evaluation today patient's heel unfortunately is significantly worse compared to what it was previous. I do believe that at this time he would benefit from switching back to something a little bit more offloading from the cushion shoe he has right now this is more of a heel protector what I really need is a Prevalon offloading boot which I think is good to do a lot better for him than just the small heel protector. 09/23/2021 upon evaluation today patient appears to be doing a little better in regards to last week's visit. Overall I think that he  is making good progress which is great news and there does not appear to be any evidence of active infection at this time. No fevers, chills, nausea, vomiting, or diarrhea. 09/30/2021 upon evaluation patient's heel is actually showing signs of good improvement which is great news. Fortunately there does not appear to be any evidence of active infection locally nor systemically at this point. No fevers, chills, nausea, vomiting, or diarrhea. 10/07/2021 upon evaluation today patient appears to be doing well currently in regard to his heel ulcer. He has been tolerating the dressing changes without complication. Fortunately I do not see any signs of active infection at this time which is great news. No fevers, chills, nausea, vomiting, or diarrhea. 12/27; wound is measuring smaller. We have been using silver alginate 10/28/2021 upon evaluation today patient appears to be doing well currently in regard to his heel ulcer. I am actually very pleased with where things stand and I think he is making excellent progress. Fortunately I do not see any signs of active  infection locally nor systemically at this point which is great news. Nonetheless I do believe that the patient would benefit from a new offloading shoe and has been using it Velcro is no longer functioning properly and he tells me that he almost tripped and fell because of that today. Obviously I do not want him following that would be very bad. TRYON, SWIGGETT ([id number]) [id number] Page 4 of 13 11/11/2021 upon evaluation today patient appears to be doing excellent in regard to his wound. In fact this appears to be completely healed based on what I see currently. I do not see any signs of anything open or draining and I did double check just by clearing away some of the callus around the edges of the wound to ensure that there was nothing still open or hiding underneath. Once this was cleared away it appeared that the patient was doing significantly better at this time which is great news and there was nothing actually open. Readmission: 06-19-2022 upon evaluation today patient appears to be doing well currently in regard to his heel ulcer all things considered. He unfortunately is having a bit of breakdown in general as far as the heel is concerned not nearly as bad as what we noted last time he was here in the clinic. The good news is I do think that this should hopefully heal much more rapidly than what we noted last time. His past medical history has not changed he is on Coumadin. 07-03-2022 upon evaluation today patient appears to be doing better in regard to his heel. We will start to see some improvement here which is good news. Fortunately I do not see any evidence of infection locally or systemically at this time which is great news. No fevers, chills, nausea, vomiting, or diarrhea. 07-10-2022 upon evaluation today patient appears to be doing well currently in regard to his wound from the callus standpoint around the edges. Unfortunately from an actual wound bed  standpoint he has some deep tissue injury noted at this point. Again I am not sure exactly what is going on that is causing this pressure to the region but he is definitely gotten pressure that is occurring and causing this to not heal as effectively as what we would like to see. I discussed with him today that he may need to at night when he sleeping use a pillow up underneath his calf in order to prevent the heel from touching the bed at all.  I also think that it may be beneficial for him to monitor throughout his day and make sure there is no other time when he is getting the pressure to the heel. 07-23-2022 upon evaluation today patient appears to be doing poorly currently in regard to his heel ulcer. He has been tolerating the dressing changes without complication. Unfortunately I feel like he may be infected based on what I am seeing. 07-28-2022 I did review patient's culture results which showed positive for Proteus as well as Staphylococcus both of which would be treated with the Bactrim DS that I gave him at the last visit. This is good news and hopefully means that we should be able to apply the cast today as long as the wound itself does not appear to be doing poorly. Patient's wound bed actually showed signs of doing quite well and I am very pleased with where things stand and I do not see any signs of worsening overall which is great news. 08-04-2022 upon evaluation today patient appears to be doing well currently in regard to his heel ulcer. I am actually very pleased with where things stand and I do think this is looking significantly better. With regard to his right shin that is also showing signs of improvement which is great news. 08-10-2022 upon evaluation today patient's wound on the heel actually appears to be doing significantly better. In regard to the right anterior shin this is showing signs of healing it might even be completely healed but I still get a monitor I cannot get  anything to fill away but also could not see where there was anything actually draining at this point. I am tending to think healed but I want a monitor 1 more week before I call it for sure. 08-17-2022 upon evaluation today patient appears to be doing well currently in regard to his heel. Fortunately he is tolerating the dressing changes without complication. I do not see any signs of infection and overall I think he is making good progress here. We did get approval for the Apligraf but I think he had a $295 co-pay that would have to be paid per application. With that being said as good as he is doing right now I do not think that is necessary but is still an option depending on how things progress if we need to speed this up quite a bit. 08/24/2022; this patient has a wound on his left heel in the setting of type 2 diabetes. We have been using Prisma. He has a heel offloading boot 08-31-2022 upon evaluation today patient appears to be doing poorly in regard to his heel ulcer which is still showing signs of bruising I am just not as happy as I was when I saw him 2 weeks ago with this. He saw Dr. Leanord Hawking last week. Also did not look good at that point apparently. Nonetheless I think that the patient is still continuing to have some counterpressure getting to the wound bed unfortunately. 09-07-2022 upon evaluation today patient appears to be doing well currently in regard to his heel ulcer. He has been tolerating the dressing changes without complication. Fortunately there does not appear to be any signs of active infection locally or systemically at this time. Fortunately I do not see any evidence of active infection at this time. 09-15-2022 upon evaluation today patient appears to be doing well currently in regard to his legs which in fact appear to be completely healed this is great news. With that being  said his heel does have some erythema and warmth as well as drainage I am concerned about  cellulitis at this location. 09-21-2022 upon evaluation today patient's wound is actually showing signs of being a little bit smaller but still we are not doing nearly as well as what I would like to see. Fortunately I do not see any signs of infection at this time which is great news and overall I am extremely pleased in that regard. With that being said I do think that he needs to continue to elevate his leg although the edema is under great control with a compression wrap I think switching to a different dressing topically may be beneficial. My suggestion is to switch him over to a Iodoflex dressing. 09-28-2022 upon evaluation today patient appears to be doing well with regard to his heel ulcer. This is actually showing signs of improvement which is great news and overall I am extremely pleased with where we stand today. 10-06-2022 upon evaluation today patient actually appears to be making excellent progress at this point. Fortunately there does not appear to be any signs of active infection locally nor systemically which is great news and overall I am extremely pleased with where we stand. I do feel like he is actually making some pretty good progress here as we switch to the wrap along with the Iodoflex. 12/26; patient has a wound on the tip of his left heel. This is not a weightbearing surface. He is using a heel offloading boot and carefully offloading this at other times during the day. He is using Iodoflex and Zetuvitunder 3 layer compression 10-22-2022 upon evaluation today patient appears to be doing well currently in regard to his wound. He has been tolerating the dressing changes without complication. Fortunately there does not appear to be any signs of active infection locally nor systemically at this time. No fevers, chills, nausea, vomiting, or diarrhea. 08-30-2023 upon evaluation today patient appears to be doing well currently in regard to his heel ulcer which is showing signs of good  improvement. Fortunately I do not see any evidence of infection locally nor systemically which is great news and overall I am extremely pleased with where we stand. No fevers, chills, nausea, vomiting, or diarrhea. Unfortunately he does have a wound on the side of his fifth toe where I think this did rub on the shoe. 11-05-2022 upon evaluation today patient still still showing some signs of pressure at this point and there may have even been some fluid collection at this time. Fortunately I do not see any evidence of active infection locally nor systemically which is great news and overall I am extremely pleased with where we stand. No fevers, chills, nausea, vomiting, or diarrhea. 1/25; the patient's area on the dorsal aspect of the left fifth toe is healed. We have been using Iodoflex to the area on the left heel. The lateral wound is slightly down in length. 11-19-2022 upon evaluation today patient appears to be doing well currently in regard to his wound. He has been tolerating the dressing changes without complication. Fortunately there does not appear to be any signs of infection we been using Iodoflex up to this point. 11-26-2022 upon evaluation today patient appears to be doing poorly currently in regard to his wound. He has been tolerating the dressing changes with the Iodoflex without complication. With that being said he does not have any signs of active infection systemically though locally I do feel like there is some evidence of  infection here. 12-03-2022 upon evaluation today patient appears to be doing well currently in regard to his wound this is better with the Bactrim compared to last week still there was some significant slough and biofilm buildup which is going require sharp debridement at this point. Fortunately I do not see any signs of active infection KERR, SOTTO ([id number]) [id number] Page 5 of 13 locally nor systemically which is great  news. 12-11-2022 upon evaluation today patient actually appears to be doing significantly better at this time in regard to his heel. I am actually very pleased with where things stand currently. There is no signs of active infection locally or systemically at this point. 12-17-2022 upon evaluation today patient appears to be doing better as far as the overall appearance of his wound. I am actually very pleased in that regard. Fortunately there does not appear to be any signs of active infection locally nor systemically at this time. I do feel like that the patient is showing signs of excellent improvement in general. Nonetheless he needs something better for compression I think he should go ahead and see about getting compression socks. 3/7; tip of the left heel measurements are smaller he has been using Hydrofera Blue 01-07-2023 upon evaluation today patient appears to be doing well currently in regard to his wound. This has not been debrided since I last saw him and it definitely shows that definitely needs some debridement today. Fortunately I do not see any evidence of active infection locally nor systemically which is great news. 01-14-2023 upon evaluation today patient appears to be doing well currently although he does have some erythema and warmth to the heel I feel like he may have developed an infection yet again. This is something that is been ongoing issue we have been trying to manage his foot best we could. With that being said I do think he may need to go back on the Bactrim this has done well for him in the past. 01-21-2023 upon evaluation today patient appears to be doing better in regard to his heel. He is doing very well with the antibiotics and that is made a big difference for him based on what I am seeing I am very pleased with where we stand today. He is obviously doing extremely well which is great news. 01-28-2023 upon evaluation today patient appears to be doing well currently in  regard to his wound. This is actually showing signs of excellent improvement I am actually very pleased with where we stand and I do believe that he is making good progress here. Fortunately I do not see any evidence of active infection locally nor systemically which is great news. No fevers, chills, nausea, vomiting, or diarrhea. 02-04-2023 upon evaluation today patient's wound is actually showed signs of excellent improvement I am actually very pleased with where we stand. I do not see any signs of active infection locally nor systemically which is great news and in general I do believe that we are moving in the right direction here. No fevers, chills, nausea, vomiting, or diarrhea. 02-11-2023 upon evaluation patient's wound actually showing signs of excellent improvement I am actually very pleased with where we stand I think that his pain is better there is no signs of infection we will continue to use the gentamicin and overall we are making excellent progress. 02-18-2023 upon evaluation today patient appears to be doing well currently in regard to his wound. He has been tolerating the dressing changes without complication. Fortunately I  do not see any signs of active infection locally nor systemically which is great news. 02-25-2023 upon evaluation today patient's heel actually showed signs of excellent improvement. I am actually very pleased with where we stand today and I feel like that he is making really good progress. Fortunately I do not see any signs of active infection which is good news. No fevers, chills, nausea, vomiting, or diarrhea. 03-04-2023 upon evaluation today patient appears to be doing pretty well currently in regard to his heel. He has been tolerating the dressing changes without complication. Fortunately there does not appear to be any signs of active infection locally nor systemically which is great news. No fevers, chills, nausea, vomiting, or diarrhea. 03-11-2023 upon evaluation  today patient appears to be doing poorly in regard to his heel ulcer. He tells me he has been having to sleep on his back to having trouble breathing therefore I think he has been having some issues here as far as the heel is concerned it looks like he has a deep tissue injury yet again at this point. Fortunately I do not see any signs of infection which is good news but at the same time I do feel like that he is doing worse than where he was previous. I do not see any signs of active infection systemically which is great news. 03-18-2023 upon evaluation today patient appears to be doing well currently in fact the heel is significantly better compared to last week. I am extremely pleased with where we are seeing I am hopeful that he will continue to show signs of improvement going forward. 03-25-2023 upon evaluation today patient appears to be doing well currently in regard to his wounds. He is actually showing signs of improvement on the heel and I am very pleased in that regard. Fortunately I do not see any signs of active infection at this time which is great news. No fevers, chills, nausea, vomiting, or diarrhea. 04-05-2023 upon evaluation today patient's wound is actually showing signs of good improvement. Actually very pleased with where things stand today. Fortunately there does not appear to be any signs of active infection locally or systemically which is great news. 04-19-2023 upon evaluation today patient's wound actually looks the best it has in quite some time. He did have a wrap on for 2 weeks which I think is caused a little bit of irritation going to the forefoot everything else looks to be doing well the wrap did slide down a little bit as well. Fortunately I do not see any signs of infection however I do think that in the future if he has any issues with not being able to get scheduled to see me he should definitely make an appointment at least for a nurse visit he is aware and in agreement  with plan going forward. 04-26-2023 upon evaluation today patient's wound is actually significantly smaller this is great news he is making good progress in the past really 5 or 6 visits we have been seeing improvement week by week I am very pleased in that regard. 05-03-2023 upon evaluation today patient appears to be doing excellent in regard to the wound on his heel. This is actually showing signs of improvement he continues to make good progress towards complete closure which is great news. Electronic Signature(s) Signed: 05/03/2023 2:38:08 PM By: [name] Derry PA-C Entered By: [name] Derry on 05/03/2023 14:38:08 Barrie Folk ([id number]) [id number] Page 6 of 13 -------------------------------------------------------------------------------- Physical Exam Details Patient Name: Date of Service:  FO RSYTH, [name] 05/03/2023 1:45 PM Medical Record Number: [id number] Patient Account Number: [id number] Date of Birth/Sex: Treating RN: 01/11/1946 (76 y.o. [name]) [name] Primary Care Provider: [name] Other Clinician: Betha Loa Referring Provider: Treating Provider/Extender: Hermine Messick Weeks in Treatment: 45 Constitutional Well-nourished and well-hydrated in no acute distress. Respiratory normal breathing without difficulty. Psychiatric this patient is able to make decisions and demonstrates good insight into disease process. Alert and Oriented x 3. pleasant and cooperative. Notes Upon inspection patient's wound bed actually showed signs of good granulation epithelization there was some slough and biofilm buildup I did perform debridement clearway the slough and biofilm as well as callus down to good subcutaneous tissue he tolerated this today without complication postdebridement the wound bed is significantly improved. Electronic Signature(s) Signed: 05/03/2023 2:38:30 PM By: [name] Derry PA-C Entered By: [name] Derry on 05/03/2023  14:38:30 -------------------------------------------------------------------------------- Physician Orders Details Patient Name: Date of Service: [name], [name] 05/03/2023 1:45 PM Medical Record Number: [id number] Patient Account Number: [id number] Date of Birth/Sex: Treating RN: 1946/03/24 (76 y.o. 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 JASSIEM, TAMBE ([id number]) [id number] Page 7 of 13 Bathing/ Shower/ Hygiene May shower; gently cleanse wound with antibacterial soap, rinse and pat dry prior to dressing wounds Non-Wound Condition dditional non-wound orders/instructions: - Apply 40% Urea cream to dry cracked skin on feet. A Off-Loading Open toe surgical shoe Wound Treatment Wound #10 - Calcaneus Wound Laterality: Left Topical: Mupirocin Ointment 3 x Per Week/30 Days Discharge Instructions: Apply as directed by provider. Prim Dressing: Hydrofera Blue Ready Transfer Foam, 2.5x2.5 (in/in) (Dispense As Written) 3 x Per Week/30 Days ary Discharge Instructions: Apply Hydrofera Blue Ready to wound bed as directed Secondary Dressing: ABD Pad 5x9 (in/in) 3 x Per Week/30 Days Discharge Instructions: Cover with ABD pad Compression Wrap: Urgo K2 Lite, two layer compression system, regular 3 x Per Week/30 Days Electronic Signature(s) Signed: 05/03/2023 4:35:54 PM By: Betha Loa Signed: 05/04/2023 5:59:11 PM By:  [name] Derry PA-C Entered By: Betha Loa on 05/03/2023 14:23:42 -------------------------------------------------------------------------------- Problem List Details Patient Name: Date of Service: [name], [name] 05/03/2023 1:45 PM Medical Record Number: [id number] Patient Account Number: [id number] Date of Birth/Sex: Treating RN: [birth year] (76 y.o. [name] Primary Care Provider: [name] Other Clinician: Midge Aver Referring Provider: Treating Provider/Extender: [name] in Treatment: 45 Active Problems ICD-10 Encounter Code Description Active Date MDM Diagnosis E11.621 Type 2 diabetes mellitus with foot ulcer 06/19/2022 No Yes 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sed 06/19/2022 No Yes E11.42 Type 2 diabetes mellitus with diabetic polyneuropathy 06/19/2022 No Yes Inactive Problems Resolved Problems LEXTON, HEARN ([id number]) [id number] Page 8 of 13 Electronic Signature(s) Signed: 05/03/2023 1:47:59 PM By: [name] Derry PA-C Previous Signature: 05/03/2023 1:19:28 PM Version By: [name] Derry PA-C Entered By: [name] Derry on 05/03/2023 13:47:59 -------------------------------------------------------------------------------- Progress Note Details Patient Name: Date of Service: [name], [name] 05/03/2023 1:45 PM Medical Record Number: [id number] Patient Account Number:  [id number] Date of Birth/Sex: Treating RN: April 06, 1946 (76 y.o. [name]) [name] Primary Care Provider: [name] Other Clinician: Betha Loa Referring Provider: Treating Provider/Extender: [name] in Treatment: 45 Subjective Chief Complaint Information obtained from Patient Left heel ulcer History of Present Illness (HPI) 09/24/2020 on evaluation today patient presents today for a heel ulcer that he tells me has been present for about 2 years. He has been seeing  podiatry and they have been attempting to manage this including what sounds to be a total contact cast, Unna boot, and just standard dressings otherwise as well. Most recently has been using triple antibiotic ointment. With that being said unfortunately despite everything he really has not had any significant improvement. He tells me that he cannot even really remember exactly how this began but he presumed it may have rubbed on his shoes or something of that nature. 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 He tells me that this does seem to help. Fortunately there is no signs of active infection at this time. His most recent hemoglobin A1c was 8.1 though he knows this was this year he cannot tell me the exact time. His fluid pills currently to help with some of the lower extremity edema although he does obviously have signs of venous stasis/lymphedema. Currently there is no evidence of active infection. No fevers, chills, nausea, vomiting, or diarrhea. Patient has had fairly recent ABIs which were performed on 07/19/2020 and revealed that he has normal findings in both the ankle and toe locations bilaterally. His ABI on the right was 1.09 on the left was 1.08 with a TBI on the right of 0.88 and on the left of 0.94. Triphasic flow was noted throughout. 10/08/2020 on evaluation today patient appears to be doing pretty well in regard to his left heel currently in fact this is doing a great job and seems to be healing quite nicely. Unfortunately on his right leg he had a pile of wood that actually fell on him injuring his right leg this is somewhat erythematous has me concerned little bit about cellulitis though there is not  really a good area to culture at this point. 10/24/2020 upon evaluation today patient appears to be doing well with regard to his heel ulcer. He is showing signs of improvement which is great news. His right leg is completely healed. Overall I feel like he is doing excellent and there is no signs of infection. 11/07/2020 upon evaluation today patient appears to be doing well with regard to his heel ulcer. He tells me that last week when he was unable to come in his wife actually thought that the wound was very close to closing if not closed. Then it began to "reopen again". I really feel like what may have happened as the collagen may have dried over the wound bed and that because that misunderstanding with thinking that the wound was healing. With that being said I did not see it last week I do not know that for certain. Either way I feel like he is doing great today I see no signs of infection at this point. 11/26/2020 upon inspection today patient appears to be doing decently well in regard to  his heel ulcer. He has been tolerating the dressing changes without complication. Fortunately there is no sign of active infection at this time. No fevers, chills, nausea, vomiting, or diarrhea. 12/10/2020 upon evaluation today patient appears to be doing fairly well in regard to the wound on his heel as well as what appears to be a new wound of the left first metatarsal head plantar aspect. This seems to be an area that was callus that has split as the patient tells me has been trying to walk on his toes more has probably where this came from. With that being said there does not appear to be signs of active infection which is great news. 12/17/2020 upon evaluation today patient actually appears to be making good progress currently. Fortunately there is no evidence of active infection at this time. Overall I feel like he is very close to complete closure. 12/26/2020 upon evaluation today patient appears to be doing  excellent in regard to his wounds. In fact I am not certain that these are not even completely healed on initial inspection. Overall I am very pleased with where things stand today. Good news is that they are healed he is actually get ready to go out of town and that will be helpful as well as he will be a full part of the time. Readmission: 02/04/2021 upon evaluation today patient appears to be doing well at this point in regard to his left heel that I previously saw him for. Unfortunately he is having issues with his right lower extremity. He has significant wounds at this point he also has erythema noted there is definite signs of cellulitis which is unfortunate. With that being said I think we do need to address this sooner rather than later. The good news is he did have a nice trip to the beach. He tells me that he had no issues during that time. 4/27; patient on Bactrim. Culture showed methicillin sensitive staph aureus therefore the Bactrim should be effective. 2 small areas on the right leg are healed the area on the mid aspect of the tibia almost 100% covered in a very adherent necrotic debris. We have been using silver alginate TRENNIS, JUDE ([id number]) [id number] Page 9 of 13 02/20/2021 upon evaluation today patient appears to be doing well with regard to his wound. He is showing signs of improvement which is great news overall very pleased with where things stand today. No fevers, chills, nausea, vomiting, or diarrhea. 02/27/2021 upon evaluation today patient appears to be doing well with regard to his leg ulcer. He is tolerating the dressing changes and overall appears to be doing quite excellent. I am extremely pleased with where things stand and overall I think patient is making great progress. There is no sign of active infection at this time which is also great news. 03/06/2021 upon evaluation today patient appears to be doing excellent in regard to his  wounds. In fact he appears to be completely healed today based on what I am seeing. This is excellent news and overall I am extremely pleased with where he stands. Overall the patient is happy to hear this as well this has been quite sometime coming. Readmission: 04/01/2021 patient unfortunately returns for readmission today. He tells me that he has been wearing his compression socks on the right leg daily and he does not really know what is going on and why his legs are doing what they are doing. We did therefore go ahead and probe deeper into exactly  what has been going on with him today. Subsequently the patient tells me that when he gets up and what we would call "first thing in the morning" are really 2 different things". He does not tend to sleep well so he tells me that he will often wake up at 3:00 in the morning. He will then potentially going into the living room to get in his chair where he may read a book for a little while and then potentially fall asleep back in his chair. He then subsequently wake up around 5:00 or so and then get up and in his words "putter around". This often will end with him proceeding at some point around 8 AM or 9 AM to putting on his compression socks. With that being said this means that anywhere from the 3:00 in the morning till roughly around 9:00 in the morning he has no compression on yet he is sitting in his recliner, walking around and up and about, and this is at least 5 to 6 hours of noncompressed time. That may be our issue here but I did not realize until we discussed this further today. Fortunately there does not appear to be any signs of active infection at this time which is great news. With that being said he has multiple wounds of the bilateral lower extremities. 04/10/2021 upon evaluation today patient appears to be doing well with regard to his legs for the most part. He does look like he had some injury where his wrap slid down nonetheless he did  some "doctoring on them". I do feel like most of the areas honestly have cleared back up which is great news. There does not appear to be any signs of active infection at this time which is also great news. No fevers, chills, nausea, vomiting, or diarrhea. 04/18/2021 upon evaluation today patient appears to be doing well with regard to his wounds in fact he appears to be completely healed which is great news. Fortunately there does not appear to be any signs of active infection which is great news. No fevers, chills, nausea, vomiting, or diarrhea. READMISSION 07/28/2021 This is a now 77 year old man we have had in this clinic for quite a bit of this year. 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 He wears compression stockings. 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 He comes in an old running shoes although he says he wears better shoes at home. His history is that he noticed a blister in the right posterior heel. This open. He has been applying Neosporin to it currently the wound measures 2 x 1.4 cm. 100% slough covered. Not really offloading this in any rigorous way. Past medical history is essentially unchanged he has paroxysmal atrial fibrillation on Coumadin type 2 diabetes with a recent hemoglobin A1c of 7 on metformin. ABI in our clinic was 0.98 on the right 08/05/2021 upon evaluation today patient appears to be doing well with regard to his heel ulcer. Fortunately there does not appear to be any signs of active infection at this time. Overall I been very pleased with where things seem to be currently as far as the wound healing is concerned. Again this is first a lot of seeing him Dr. Leanord Hawking readmitted him last week. Nonetheless I think we are definitely making some progress here. 08/11/2021 upon evaluation today patient's wound on the heel actually showing signs of excellent  improvement.  I am actually very pleased with where things stand currently. No fevers, chills, nausea, vomiting, or diarrhea. There does not appear to be any need for sharp debridement today either which is also great news. 08/25/2021 upon evaluation today patient appears to be doing well with regard to his heel ulcer. This is actually looking significantly improved compared to last time I saw him. Fortunately there does not appear to be any evidence of active infection at this time. No fevers, chills, nausea, vomiting, or diarrhea. 09/01/2021 upon evaluation today patient appears to be doing decently well in regard to his wounds. Fortunately there does not appear to be any signs of active infection at this time which is great news and overall very pleased in that regard. I do not see any evidence of active infection systemically which is great news as well. No fevers, chills, nausea, vomiting, or diarrhea. I think that the heel is making excellent progress. 09/15/2021 upon evaluation today patient's heel unfortunately is significantly worse compared to what it was previous. I do believe that at this time he would benefit from switching back to something a little bit more offloading from the cushion shoe he has right now this is more of a heel protector what I really need is a Prevalon offloading boot which I think is good to do a lot better for him than just the small heel protector. 09/23/2021 upon evaluation today patient appears to be doing a little better in regards to last week's visit. Overall I think that he is making good progress which is great news and there does not appear to be any evidence of active infection at this time. No fevers, chills, nausea, vomiting, or diarrhea. 09/30/2021 upon evaluation patient's heel is actually showing signs of good improvement which is great news. Fortunately there does not appear to be any evidence of active infection locally nor systemically at this point.  No fevers, chills, nausea, vomiting, or diarrhea. 10/07/2021 upon evaluation today patient appears to be doing well currently in regard to his heel ulcer. He has been tolerating the dressing changes without complication. Fortunately I do not see any signs of active infection at this time which is great news. No fevers, chills, nausea, vomiting, or diarrhea. 12/27; wound is measuring smaller. We have been using silver alginate 10/28/2021 upon evaluation today patient appears to be doing well currently in regard to his heel ulcer. I am actually very pleased with where things stand and I think he is making excellent progress. Fortunately I do not see any signs of active infection locally nor systemically at this point which is great news. Nonetheless I do believe that the patient would benefit from a new offloading shoe and has been using it Velcro is no longer functioning properly and he tells me that he almost tripped and fell because of that today. Obviously I do not want him following that would be very bad. 11/11/2021 upon evaluation today patient appears to be doing excellent in regard to his wound. In fact this appears to be completely healed based on what I see currently. I do not see any signs of anything open or draining and I did double check just by clearing away some of the callus around the edges of the wound to ensure that there was nothing still open or hiding underneath. Once this was cleared away it appeared that the patient was doing significantly better at this time which is great news and there was nothing actually open. Readmission: 06-19-2022 upon evaluation  today patient appears to be doing well currently in regard to his heel ulcer all things considered. He unfortunately is having a bit of breakdown in general as far as the heel is concerned not nearly as bad as what we noted last time he was here in the clinic. The good news is I do think that this should hopefully heal much more  rapidly than what we noted last time. His past medical history has not changed he is on Coumadin. 07-03-2022 upon evaluation today patient appears to be doing better in regard to his heel. We will start to see some improvement here which is good news. RIGEL, KOYANAGI ([id number]) [id number] Page 10 of 13 Fortunately I do not see any evidence of infection locally or systemically at this time which is great news. No fevers, chills, nausea, vomiting, or diarrhea. 07-10-2022 upon evaluation today patient appears to be doing well currently in regard to his wound from the callus standpoint around the edges. Unfortunately from an actual wound bed standpoint he has some deep tissue injury noted at this point. Again I am not sure exactly what is going on that is causing this pressure to the region but he is definitely gotten pressure that is occurring and causing this to not heal as effectively as what we would like to see. I discussed with him today that he may need to at night when he sleeping use a pillow up underneath his calf in order to prevent the heel from touching the bed at all. I also think that it may be beneficial for him to monitor throughout his day and make sure there is no other time when he is getting the pressure to the heel. 07-23-2022 upon evaluation today patient appears to be doing poorly currently in regard to his heel ulcer. He has been tolerating the dressing changes without complication. Unfortunately I feel like he may be infected based on what I am seeing. 07-28-2022 I did review patient's culture results which showed positive for Proteus as well as Staphylococcus both of which would be treated with the Bactrim DS that I gave him at the last visit. This is good news and hopefully means that we should be able to apply the cast today as long as the wound itself does not appear to be doing poorly. Patient's wound bed actually showed signs of doing quite well  and I am very pleased with where things stand and I do not see any signs of worsening overall which is great news. 08-04-2022 upon evaluation today patient appears to be doing well currently in regard to his heel ulcer. I am actually very pleased with where things stand and I do think this is looking significantly better. With regard to his right shin that is also showing signs of improvement which is great news. 08-10-2022 upon evaluation today patient's wound on the heel actually appears to be doing significantly better. In regard to the right anterior shin this is showing signs of healing it might even be completely healed but I still get a monitor I cannot get anything to fill away but also could not see where there was anything actually draining at this point. I am tending to think healed but I want a monitor 1 more week before I call it for sure. 08-17-2022 upon evaluation today patient appears to be doing well currently in regard to his heel. Fortunately he is tolerating the dressing changes without complication. I do not see any signs of infection and overall  I think he is making good progress here. We did get approval for the Apligraf but I think he had a $295 co-pay that would have to be paid per application. With that being said as good as he is doing right now I do not think that is necessary but is still an option depending on how things progress if we need to speed this up quite a bit. 08/24/2022; this patient has a wound on his left heel in the setting of type 2 diabetes. We have been using Prisma. He has a heel offloading boot 08-31-2022 upon evaluation today patient appears to be doing poorly in regard to his heel ulcer which is still showing signs of bruising I am just not as happy as I was when I saw him 2 weeks ago with this. He saw Dr. Leanord Hawking last week. Also did not look good at that point apparently. Nonetheless I think that the patient is still continuing to have some  counterpressure getting to the wound bed unfortunately. 09-07-2022 upon evaluation today patient appears to be doing well currently in regard to his heel ulcer. He has been tolerating the dressing changes without complication. Fortunately there does not appear to be any signs of active infection locally or systemically at this time. Fortunately I do not see any evidence of active infection at this time. 09-15-2022 upon evaluation today patient appears to be doing well currently in regard to his legs which in fact appear to be completely healed this is great news. With that being said his heel does have some erythema and warmth as well as drainage I am concerned about cellulitis at this location. 09-21-2022 upon evaluation today patient's wound is actually showing signs of being a little bit smaller but still we are not doing nearly as well as what I would like to see. Fortunately I do not see any signs of infection at this time which is great news and overall I am extremely pleased in that regard. With that being said I do think that he needs to continue to elevate his leg although the edema is under great control with a compression wrap I think switching to a different dressing topically may be beneficial. My suggestion is to switch him over to a Iodoflex dressing. 09-28-2022 upon evaluation today patient appears to be doing well with regard to his heel ulcer. This is actually showing signs of improvement which is great news and overall I am extremely pleased with where we stand today. 10-06-2022 upon evaluation today patient actually appears to be making excellent progress at this point. Fortunately there does not appear to be any signs of active infection locally nor systemically which is great news and overall I am extremely pleased with where we stand. I do feel like he is actually making some pretty good progress here as we switch to the wrap along with the Iodoflex. 12/26; patient has a wound on  the tip of his left heel. This is not a weightbearing surface. He is using a heel offloading boot and carefully offloading this at other times during the day. He is using Iodoflex and Zetuvitunder 3 layer compression 10-22-2022 upon evaluation today patient appears to be doing well currently in regard to his wound. He has been tolerating the dressing changes without complication. Fortunately there does not appear to be any signs of active infection locally nor systemically at this time. No fevers, chills, nausea, vomiting, or diarrhea. 08-30-2023 upon evaluation today patient appears to be doing well currently  in regard to his heel ulcer which is showing signs of good improvement. Fortunately I do not see any evidence of infection locally nor systemically which is great news and overall I am extremely pleased with where we stand. No fevers, chills, nausea, vomiting, or diarrhea. Unfortunately he does have a wound on the side of his fifth toe where I think this did rub on the shoe. 11-05-2022 upon evaluation today patient still still showing some signs of pressure at this point and there may have even been some fluid collection at this time. Fortunately I do not see any evidence of active infection locally nor systemically which is great news and overall I am extremely pleased with where we stand. No fevers, chills, nausea, vomiting, or diarrhea. 1/25; the patient's area on the dorsal aspect of the left fifth toe is healed. We have been using Iodoflex to the area on the left heel. The lateral wound is slightly down in length. 11-19-2022 upon evaluation today patient appears to be doing well currently in regard to his wound. He has been tolerating the dressing changes without complication. Fortunately there does not appear to be any signs of infection we been using Iodoflex up to this point. 11-26-2022 upon evaluation today patient appears to be doing poorly currently in regard to his wound. He has been  tolerating the dressing changes with the Iodoflex without complication. With that being said he does not have any signs of active infection systemically though locally I do feel like there is some evidence of infection here. 12-03-2022 upon evaluation today patient appears to be doing well currently in regard to his wound this is better with the Bactrim compared to last week still there was some significant slough and biofilm buildup which is going require sharp debridement at this point. Fortunately I do not see any signs of active infection locally nor systemically which is great news. 12-11-2022 upon evaluation today patient actually appears to be doing significantly better at this time in regard to his heel. I am actually very pleased with where things stand currently. There is no signs of active infection locally or systemically at this point. 12-17-2022 upon evaluation today patient appears to be doing better as far as the overall appearance of his wound. I am actually very pleased in that regard. Fortunately there does not appear to be any signs of active infection locally nor systemically at this time. I do feel like that the patient is showing signs of excellent improvement in general. Nonetheless he needs something better for compression I think he should go ahead and see about getting compression socks. 3/7; tip of the left heel measurements are smaller he has been using [address] FIDEL, TOTA ([id number]) [id number] Page 11 of 13 01-07-2023 upon evaluation today patient appears to be doing well currently in regard to his wound. This has not been debrided since I last saw him and it definitely shows that definitely needs some debridement today. Fortunately I do not see any evidence of active infection locally nor systemically which is great news. 01-14-2023 upon evaluation today patient appears to be doing well currently although he does have some erythema  and warmth to the heel I feel like he may have developed an infection yet again. This is something that is been ongoing issue we have been trying to manage his foot best we could. With that being said I do think he may need to go back on the Bactrim this has done well for him in  the past. 01-21-2023 upon evaluation today patient appears to be doing better in regard to his heel. He is doing very well with the antibiotics and that is made a big difference for him based on what I am seeing I am very pleased with where we stand today. He is obviously doing extremely well which is great news. 01-28-2023 upon evaluation today patient appears to be doing well currently in regard to his wound. This is actually showing signs of excellent improvement I am actually very pleased with where we stand and I do believe that he is making good progress here. Fortunately I do not see any evidence of active infection locally nor systemically which is great news. No fevers, chills, nausea, vomiting, or diarrhea. 02-04-2023 upon evaluation today patient's wound is actually showed signs of excellent improvement I am actually very pleased with where we stand. I do not see any signs of active infection locally nor systemically which is great news and in general I do believe that we are moving in the right direction here. No fevers, chills, nausea, vomiting, or diarrhea. 02-11-2023 upon evaluation patient's wound actually showing signs of excellent improvement I am actually very pleased with where we stand I think that his pain is better there is no signs of infection we will continue to use the gentamicin and overall we are making excellent progress. 02-18-2023 upon evaluation today patient appears to be doing well currently in regard to his wound. He has been tolerating the dressing changes without complication. Fortunately I do not see any signs of active infection locally nor systemically which is great news. 02-25-2023 upon  evaluation today patient's heel actually showed signs of excellent improvement. I am actually very pleased with where we stand today and I feel like that he is making really good progress. Fortunately I do not see any signs of active infection which is good news. No fevers, chills, nausea, vomiting, or diarrhea. 03-04-2023 upon evaluation today patient appears to be doing pretty well currently in regard to his heel. He has been tolerating the dressing changes without complication. Fortunately there does not appear to be any signs of active infection locally nor systemically which is great news. No fevers, chills, nausea, vomiting, or diarrhea. 03-11-2023 upon evaluation today patient appears to be doing poorly in regard to his heel ulcer. He tells me he has been having to sleep on his back to having trouble breathing therefore I think he has been having some issues here as far as the heel is concerned it looks like he has a deep tissue injury yet again at this point. Fortunately I do not see any signs of infection which is good news but at the same time I do feel like that he is doing worse than where he was previous. I do not see any signs of active infection systemically which is great news. 03-18-2023 upon evaluation today patient appears to be doing well currently in fact the heel is significantly better compared to last week. I am extremely pleased with where we are seeing I am hopeful that he will continue to show signs of improvement going forward. 03-25-2023 upon evaluation today patient appears to be doing well currently in regard to his wounds. He is actually showing signs of improvement on the heel and I am very pleased in that regard. Fortunately I do not see any signs of active infection at this time which is great news. No fevers, chills, nausea, vomiting, or diarrhea. 04-05-2023 upon evaluation today patient's  wound is actually showing signs of good improvement. Actually very pleased with  where things stand today. Fortunately there does not appear to be any signs of active infection locally or systemically which is great news. 04-19-2023 upon evaluation today patient's wound actually looks the best it has in quite some time. He did have a wrap on for 2 weeks which I think is caused a little bit of irritation going to the forefoot everything else looks to be doing well the wrap did slide down a little bit as well. Fortunately I do not see any signs of infection however I do think that in the future if he has any issues with not being able to get scheduled to see me he should definitely make an appointment at least for a nurse visit he is aware and in agreement with plan going forward. 04-26-2023 upon evaluation today patient's wound is actually significantly smaller this is great news he is making good progress in the past really 5 or 6 visits we have been seeing improvement week by week I am very pleased in that regard. 05-03-2023 upon evaluation today patient appears to be doing excellent in regard to the wound on his heel. This is actually showing signs of improvement he continues to make good progress towards complete closure which is great news. Objective Constitutional Well-nourished and well-hydrated in no acute distress. Vitals Time Taken: 1:55 PM, Height: 70 in, Weight: 265 lbs, BMI: 38, Temperature: 97.7 F, Pulse: 62 bpm, Respiratory Rate: 18 breaths/min, Blood Pressure: 132/75 mmHg. Respiratory normal breathing without difficulty. Psychiatric this patient is able to make decisions and demonstrates good insight into disease process. Alert and Oriented x 3. pleasant and cooperative. General Notes: Upon inspection patient's wound bed actually showed signs of good granulation epithelization there was some slough and biofilm buildup I did perform debridement clearway the slough and biofilm as well as callus down to good subcutaneous tissue he tolerated this today without  complication postdebridement the wound bed is significantly improved. Integumentary (Hair, Skin) Wound #10 status is Open. Original cause of wound was Gradually Appeared. The date acquired was: 06/05/2022. The wound has been in treatment 45 weeks. The wound is located on the Left Calcaneus. The wound measures 0.5cm length x 1cm width x 0.1cm depth; 0.393cm^2 area and 0.039cm^3 volume. There is Fat Layer (Subcutaneous Tissue) exposed. There is a medium amount of serosanguineous drainage noted. The wound margin is flat and intact. There is small CLYDIE, DELAMORA ([id number]) [id number] Page 12 of 13 (1-33%) pink granulation within the wound bed. There is a large (67-100%) amount of necrotic tissue within the wound bed including Adherent Slough. 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 There was a Excisional Skin/Subcutaneous Tissue Debridement with a total area of 0.39 sq cm performed by [name] Derry, PA-C. With the following instrument(s): Curette to remove Viable and Non-Viable tissue/material. Material removed includes Callus, Subcutaneous Tissue, and Slough. A time out was conducted at 14:20, prior to the start of the procedure. A Minimum amount of bleeding was controlled with Pressure. The procedure was tolerated well. Post Debridement Measurements: 0.5cm length x 1cm width x 0.1cm depth; 0.039cm^3 volume. Character of Wound/Ulcer Post Debridement is  improved. Severity of Tissue Post Debridement is: Fat layer exposed. Post procedure Diagnosis Wound #10: Same as Pre-Procedure Pre-procedure diagnosis of Wound  #10 is a Diabetic Wound/Ulcer of the Lower Extremity located on the Left Calcaneus . There was a Double Layer Compression Therapy Procedure with a pre-treatment ABI of 1 by Betha Loa. Post procedure Diagnosis Wound #10: Same as Pre-Procedure Plan Follow-up Appointments: Wound #10 Left Calcaneus: Return Appointment in 1 week. Bathing/ Shower/ Hygiene: May shower; gently cleanse wound with antibacterial soap, rinse and pat dry prior to dressing wounds Non-Wound Condition: Additional non-wound orders/instructions: - Apply 40% Urea cream to dry cracked skin on feet. Off-Loading: Open toe surgical shoe WOUND #10: - Calcaneus Wound Laterality: Left Topical: Mupirocin Ointment 3 x Per Week/30 Days Discharge Instructions: Apply as directed by provider. Prim Dressing: Hydrofera Blue Ready Transfer Foam, 2.5x2.5 (in/in) (Dispense As Written) 3 x Per Week/30 Days ary Discharge Instructions: Apply Hydrofera Blue Ready to wound bed as directed Secondary Dressing: ABD Pad 5x9 (in/in) 3 x Per Week/30 Days Discharge Instructions: Cover with ABD pad Com pression Wrap: Urgo K2 Lite, two layer compression system, regular 3 x Per Week/30 Days 1. I would recommend that we have the patient continue to monitor for any signs of infection or worsening. Based on what I am seeing I do believe that we are making good headway towards closure. 2. I am good recommend that the patient should continue to utilize the compression wrapping I think this is really making good progress towards getting this completely closed. 3. I am also can recommend specifically that he should continue with appropriate offloading the more that he does the better. We will see patient back for reevaluation in 1 week here in the clinic. If anything worsens or changes patient will contact our office for additional recommendations. Electronic Signature(s) Signed: 05/03/2023 2:39:04 PM By: [name] Derry PA-C Entered By: [name] Derry on  05/03/2023 14:39:04 Barrie Folk ([id number]) [id number] Page 13 of 13 -------------------------------------------------------------------------------- SuperBill Details Patient Name: Date of Service: [name], [name] 05/03/2023 Medical Record Number: [id number] Patient Account Number: [id number] Date of Birth/Sex: Treating RN: [birth year] (76 y.o. [name]) [name] Primary Care Provider: [name] Other Clinician: Betha Loa Referring Provider: Treating Provider/Extender: Hermine Messick Weeks in Treatment: 45 Diagnosis Coding ICD-10 Codes Code Description E11.621 Type 2 diabetes mellitus with foot ulcer 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sed E11.42 Type 2 diabetes mellitus with diabetic polyneuropathy Facility Procedures : CPT4 Code: 86578469 Description: 11042 - DEB SUBQ TISSUE 20 SQ CM/< ICD-10 Diagnosis Description 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sed Modifier: Quantity: 1 Physician Procedures : CPT4 Code Description Modifier [id number] 11042 - WC PHYS SUBQ TISS 20 SQ CM ICD-10 Diagnosis Description 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sed Quantity: 1 Electronic Signature(s) Signed: 05/03/2023 2:39:21 PM By: [name] Derry PA-C Entered By: [name] Derry on 05/03/2023 14:39:20

## 2023-05-03 NOTE — Progress Notes (Addendum)
STEBAN, [name] ([id number]) [id number] Page 1 of 9 Visit Report for 05/03/2023 Arrival Information Details Patient Name: Date of Service: [name], GEO [facility] J. 05/03/2023 1:45 PM Medical Record Number: [id number] Patient Account Number: [id number] Date of Birth/Sex: Treating RN: Oct 27, 1945 (77 y.o. [name]) [name] Primary Care Yoel Kaufhold: [name] Other Clinician: Betha Loa Referring Stephene Alegria: Treating Adileny Delon/Extender: [name] in Treatment: 45 Visit Information History Since Last Visit All ordered tests and consults were completed: No Patient Arrived: [name] Added or deleted any medications: No Arrival Time: 13:51 Any new allergies or adverse reactions: No Transfer Assistance: None Had a fall or experienced change in No Patient Identification Verified: Yes activities of daily living that may affect Secondary Verification Process Completed: Yes risk of falls: Patient Requires Transmission-Based Precautions: No Signs or symptoms of abuse/neglect since last visito No Patient Has Alerts: Yes Hospitalized since last visit: No Patient Alerts: Patient on Blood Thinner Implantable device outside of the clinic excluding No Warfarin cellular tissue based products placed in the [name] Type II Diabetic since last visit: Has Dressing in Place as Prescribed: Yes Has Compression in Place as Prescribed: Yes Pain Present Now: No Electronic Signature(s) Signed: 05/03/2023 4:35:54 PM By: Betha Loa Entered By: Betha Loa on 05/03/2023 13:53:56 -------------------------------------------------------------------------------- Clinic Level of Care Assessment Details Patient Name: Date of Service: [name] J. 05/03/2023 1:45 PM Medical Record Number: [id number] Patient Account Number: [id number] Date of Birth/Sex: Treating RN: [birth year] (77 y.o. [name]) [name] Primary Care Silvia Hightower: [name] Other Clinician:  Betha Loa Referring Margues Filippini: Treating Kawanna Christley/Extender: [name] in Treatment: 45 Clinic Level of Care Assessment Items TOOL 1 Quantity Score []  - 0 Use when EandM and Procedure is performed on INITIAL visit ASSESSMENTS - Nursing Assessment / Reassessment []  - 0 General Physical Exam (combine w/ comprehensive assessment (listed just below) when performed on new pt. [name] [name], [name] ([id number]) [id number] Page 2 of 9 []  - 0 Comprehensive Assessment (HX, ROS, Risk Assessments, Wounds Hx, etc.) ASSESSMENTS - Wound and Skin Assessment / Reassessment []  - 0 Dermatologic / Skin Assessment (not related to wound area) ASSESSMENTS - Ostomy and/or Continence Assessment and Care []  - 0 Incontinence Assessment and Management []  - 0 Ostomy Care Assessment and Management (repouching, etc.) PROCESS - Coordination of Care []  - 0 Simple Patient / Family Education for ongoing care []  - 0 Complex (extensive) Patient / Family Education for ongoing care []  - 0 Staff obtains Chiropractor, Records, T Results / Process Orders est []  - 0 Staff telephones HHA, Nursing Homes / Clarify orders / etc []  - 0 Routine Transfer to another Facility (non-emergent condition) []  - 0 Routine Hospital Admission (non-emergent condition) []  - 0 New Admissions / Manufacturing engineer / Ordering NPWT Apligraf, etc. , []  - 0 Emergency Hospital Admission (emergent condition) PROCESS - Special Needs []  - 0 Pediatric / Minor Patient Management []  - 0 Isolation Patient Management []  - 0 Hearing / Language / Visual special needs []  - 0 Assessment of Community assistance (transportation, D/C planning, etc.) []  - 0 Additional assistance / Altered mentation []  - 0 Support Surface(s) Assessment (bed, cushion, seat, etc.) INTERVENTIONS - Miscellaneous []  - 0 External ear exam []  - 0 Patient Transfer (multiple staff / Nurse, adult / Similar  devices) []  - 0 Simple Staple / Suture removal (25 or less) []  - 0 Complex Staple / Suture removal (26 or more) []  - 0 Hypo/Hyperglycemic Management (do not check if billed separately) []  -  0 Ankle / Brachial Index (ABI) - do not check if billed separately Has the patient been seen at the hospital within the last three years: Yes Total Score: 0 Level Of Care: ____ Electronic Signature(s) Signed: 05/03/2023 4:35:54 PM By: Betha Loa Entered By: Betha Loa on 05/03/2023 14:23:53 -------------------------------------------------------------------------------- Compression Therapy Details Patient Name: Date of Service: [name], [name] 05/03/2023 1:45 PM Medical Record Number: [id number] Patient Account Number: [id number] Date of Birth/Sex: Treating RN: 11-10-1945 (77 y.o. [name] Primary [place] Breda Bond: [name] Other Clinician: Daquain, Contorno ([id number]) [id number] Page 3 of 9 Referring Cande Mastropietro: Treating [name]/Extender: [name] in Treatment: 45 Compression Therapy Performed for Wound Assessment: Wound #10 Left Calcaneus Performed By: Farrel Gordon, Angie, Compression Type: Double Layer Pre Treatment ABI: 1 Post Procedure Diagnosis Same as Pre-procedure Electronic Signature(s) Signed: 05/03/2023 4:35:54 PM By: Betha Loa Entered By: Betha Loa on 05/03/2023 14:22:36 -------------------------------------------------------------------------------- Encounter Discharge Information Details Patient Name: Date of Service: [name], [name] 05/03/2023 1:45 PM Medical Record Number: [id number] Patient Account Number: [id number] Date of Birth/Sex: Treating RN: 05/25/1946 (77 y.o. [name] Primary Care [name]: [name] Other Clinician: Betha Loa Referring [place] Mckeough: Treating Cloyce Paterson/Extender: [name] in Treatment: 45 Encounter  Discharge Information Items Post Procedure Vitals Discharge Condition: Stable Temperature (F): 97.2 Ambulatory Status: Walker Pulse (bpm): 62 Discharge Destination: Home Respiratory Rate (breaths/min): 18 Transportation: Private Auto Blood Pressure (mmHg): 132/75 Accompanied By: self Schedule Follow-up Appointment: Yes Clinical Summary of Care: Electronic Signature(s) Signed: 05/03/2023 4:35:54 PM By: Betha Loa Entered By: Betha Loa on 05/03/2023 14:39:39 -------------------------------------------------------------------------------- Lower Extremity Assessment Details Patient Name: Date of Service: [name] [facility] J. 05/03/2023 1:45 PM Medical Record Number: [id number] Patient Account Number: [id number] Date of Birth/Sex: Treating RN: 03-16-1946 (76 y.o. [name] Primary Care Lindie Roberson: [name] Other Clinician: Betha Loa Referring Chantee Cerino: Treating Kecia Swoboda/Extender: [name] in Treatment: [address] ([id number]) [id number] Page 4 of 9 Edema Assessment Assessed: [Left: Yes] [Right: No] Edema: [Left: Ye] [Right: s] Calf Left: Right: Point of Measurement: 33 cm From Medial Instep 39 cm Ankle Left: Right: Point of Measurement: 11 cm From Medial Instep 21.5 cm Vascular Assessment Pulses: Dorsalis Pedis Palpable: [Left:Yes] Electronic Signature(s) Signed: 05/03/2023 4:35:54 PM By: Betha Loa Signed: 05/06/2023 3:42:23 PM By: [name] RN Entered By: Betha Loa on 05/03/2023 14:10:09 -------------------------------------------------------------------------------- Multi Wound Chart Details Patient Name: Date of Service: [name], [name] 05/03/2023 1:45 PM Medical Record Number: [id number] Patient Account Number: [id number] Date of Birth/Sex: Treating RN: 1946/04/07 (76 y.o. [name] Primary Care Raina Sole: [name] Other Clinician: Betha Loa Referring  Martyn Timme: Treating Logan Vegh/Extender: [name] in Treatment: 45 Vital Signs Height(in): 70 Pulse(bpm): 62 Weight(lbs): 265 Blood Pressure(mmHg): 132/75 Body Mass Index(BMI): 38 Temperature(F): 97.7 Respiratory Rate(breaths/min): 18 [10:Photos:] [N/A:N/A] Left Calcaneus N/A N/A Wound Location: Gradually Appeared N/A N/A Wounding 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 N/A N/A Secondary Etiology: Cataracts, Arrhythmia, Coronary N/A N/A Comorbid History: Artery Disease, Hypertension, Type II Diabetes, Osteoarthritis, Neuropathy DEJEAN, DOSTAL ([id number]) [id number] Page 5 of 9 06/05/2022 N/A N/A Date Acquired: 41 N/A N/A Weeks of Treatment: Open N/A N/A Wound Status: No N/A N/A Wound Recurrence: 0.5x1x0.1 N/A N/A Measurements L x W x D (cm) 0.393 N/A N/A A (cm) : rea 0.039 N/A N/A Volume (cm) : 72.20% N/A N/A % Reduction in A  rea: 86.20% N/A N/A % Reduction in Volume: Grade 1 N/A N/A Classification: Medium N/A N/A Exudate A mount: Serosanguineous N/A N/A Exudate Type: red, brown N/A N/A Exudate Color: Flat and Intact N/A N/A Wound Margin: Small (1-33%) N/A N/A Granulation A mount: Pink N/A N/A Granulation Quality: Large (67-100%) N/A N/A Necrotic A mount: Fat Layer (Subcutaneous Tissue): Yes N/A N/A Exposed Structures: Fascia: No Tendon: No Muscle: No Joint: No Bone: No Small (1-33%) N/A N/A Epithelialization: Treatment Notes Electronic Signature(s) Signed: 05/03/2023 4:35:54 PM By: Betha Loa Entered By: Betha Loa on 05/03/2023 14:10:14 -------------------------------------------------------------------------------- Multi-Disciplinary Care Plan Details Patient Name: Date of Service: [name], [name] 05/03/2023 1:45 PM Medical Record Number: [id number] Patient Account Number: [id number] Date of Birth/Sex: Treating  RN: 1946/02/18 (76 y.o. [name]) [name] Primary Care Domonic Kimball: [name] Other Clinician: Betha Loa Referring Aliviah Spain: Treating Si Jachim/Extender: [name] in Treatment: 45 Active Inactive Necrotic Tissue Nursing Diagnoses: Impaired tissue integrity related to necrotic/devitalized tissue Knowledge deficit related to management of necrotic/devitalized tissue Goals: Necrotic/devitalized tissue will be minimized in the wound bed Date Initiated: 03/04/2023 Target Resolution Date: 03/18/2023 Goal Status: Active Patient/caregiver will verbalize understanding of reason and process for debridement of necrotic tissue Date Initiated: 03/04/2023 Target Resolution Date: 03/04/2023 Goal Status: Active Interventions: Assess patient pain level pre-, during and post procedure and prior to discharge Provide education on necrotic tissue and debridement process Treatment Activities: Excisional debridement : 03/04/2023 Barrie Folk ([id number]) [id number] Page 6 of 9 Notes: Wound/Skin Impairment Nursing Diagnoses: Impaired tissue integrity Goals: Patient/caregiver will verbalize understanding of skin care regimen Date Initiated: 06/19/2022 Date Inactivated: 07/10/2022 Target Resolution Date: 06/19/2022 Goal Status: Met Ulcer/skin breakdown will have a volume reduction of 30% by week 4 Date Initiated: 06/19/2022 Date Inactivated: 07/28/2022 Target Resolution Date: 07/17/2022 Goal Status: Met Ulcer/skin breakdown will have a volume reduction of 50% by week 8 Date Initiated: 07/28/2022 Target Resolution Date: 03/20/2023 Goal Status: Active Interventions: Assess patient/caregiver ability to obtain necessary supplies Assess patient/caregiver ability to perform ulcer/skin care regimen upon admission and as needed Assess ulceration(s) every visit Provide education on ulcer and skin care Treatment Activities: Skin care regimen initiated :  06/19/2022 Topical wound management initiated : 06/19/2022 Notes: Electronic Signature(s) Signed: 05/03/2023 4:35:54 PM By: Betha Loa Signed: 05/06/2023 3:42:23 PM By: [name] RN Entered By: Betha Loa on 05/03/2023 14:24:19 -------------------------------------------------------------------------------- Pain Assessment Details Patient Name: Date of Service: [name], [name] 05/03/2023 1:45 PM Medical Record Number: [id number] Patient Account Number: [id number] Date of Birth/Sex: Treating RN: 08-28-1946 (76 y.o. [name] Primary Care Trygve Thal: [name] Other Clinician: Betha Loa Referring Shayma Pfefferle: Treating Karole Oo/Extender: [name] in Treatment: 45 Active Problems Location of Pain Severity and Description of Pain Patient Has Paino No Site Locations [place], ZELAZNY ([id number]) [id number] Page 7 of 9 Pain Management and Medication Current Pain Management: Electronic Signature(s) Signed: 05/03/2023 4:35:54 PM By: Betha Loa Signed: 05/06/2023 3:42:23 PM By: [name] RN Entered By: Betha Loa on 05/03/2023 13:57:17 -------------------------------------------------------------------------------- Patient/Caregiver Education Details Patient Name: Date of Service: [name], GEO Cherrie Gauze 7/15/2024andnbsp1:45 PM Medical Record Number: [id number] Patient Account Number: [id number] Date of Birth/Gender: Treating RN: 11/02/1945 (76 y.o. [name] Primary Care Physician: [name] Other Clinician: Betha Loa Referring Physician: Treating Physician/Extender: [name] in Treatment: 45 Education Assessment Education Provided To: Patient Education Topics Provided Wound/Skin Impairment: Handouts: Other: continue wound care as directed Methods: Explain/Verbal Responses: State content correctly  Electronic Signature(s) Signed: 05/03/2023 4:35:54 PM By:  Betha Loa Entered By: Betha Loa on 05/03/2023 14:24:41 Barrie Folk ([id number]) [id number] Page 8 of 9 -------------------------------------------------------------------------------- Wound Assessment Details Patient Name: Date of Service: [name], [name] 05/03/2023 1:45 PM Medical Record Number: [id number] Patient Account Number: [id number] Date of Birth/Sex: Treating RN: 20-Aug-1946 (76 y.o. [name]) [name] Primary Care Dorthy Magnussen: [name] Other Clinician: Betha Loa Referring Cally Nygard: Treating Zafirah Vanzee/Extender: Hermine Messick Weeks in Treatment: 45 Wound Status Wound Number: 10 Primary Diabetic Wound/Ulcer of the Lower Extremity Etiology: Wound Location: Left Calcaneus Secondary Pressure Ulcer Wounding Event: Gradually Appeared Etiology: Date Acquired: 06/05/2022 Wound Open Weeks Of Treatment: 45 Status: Clustered Wound: No Comorbid Cataracts, Arrhythmia, Coronary Artery Disease, Hypertension, History: Type II Diabetes, Osteoarthritis, Neuropathy Photos Wound Measurements Length: (cm) 0.5 Width: (cm) 1 Depth: (cm) 0.1 Area: (cm) 0.393 Volume: (cm) 0.039 % Reduction in Area: 72.2% % Reduction in Volume: 86.2% Epithelialization: Small (1-33%) Wound Description Classification: Grade 1 Wound Margin: Flat and Intact Exudate Amount: Medium Exudate Type: Serosanguineous Exudate Color: red, brown Foul Odor After Cleansing: No Slough/Fibrino Yes Wound Bed Granulation Amount: Small (1-33%) Exposed Structure Granulation Quality: Pink Fascia Exposed: No Necrotic Amount: Large (67-100%) Fat Layer (Subcutaneous Tissue) Exposed: Yes Necrotic Quality: Adherent Slough Tendon Exposed: No Muscle Exposed: No Joint Exposed: No Bone Exposed: No Treatment Notes Wound #10 (Calcaneus) Wound Laterality: Left Cleanser Peri-Wound Care MALE, MEATH ([id number]) [id number] Page 9 of  9 Topical Mupirocin Ointment Discharge Instruction: Apply as directed by Chani Ghanem. Primary Dressing Hydrofera Blue Ready Transfer Foam, 2.5x2.5 (in/in) Discharge Instruction: Apply Hydrofera Blue Ready to wound bed as directed Secondary Dressing ABD Pad 5x9 (in/in) Discharge Instruction: Cover with ABD pad Secured With Compression Wrap Urgo K2 Lite, two layer compression system, regular Compression Stockings Add-Ons Electronic Signature(s) Signed: 05/03/2023 4:35:54 PM By: Betha Loa Signed: 05/06/2023 3:42:23 PM By: [name] RN Entered By: Betha Loa on 05/03/2023 14:09:09 -------------------------------------------------------------------------------- Vitals Details Patient Name: Date of Service: [name], [name] 05/03/2023 1:45 PM Medical Record Number: [id number] Patient Account Number: [id number] Date of Birth/Sex: Treating RN: 1945-12-04 (76 y.o. [name] Primary Care Jarred Purtee: [name] Other Clinician: Betha Loa Referring Kaedance Magos: Treating Eisha Chatterjee/Extender: [name] in Treatment: 45 Vital Signs Time Taken: 13:55 Temperature (F): 97.7 Height (in): 70 Pulse (bpm): 62 Weight (lbs): 265 Respiratory Rate (breaths/min): 18 Body Mass Index (BMI): 38 Blood Pressure (mmHg): 132/75 Reference Range: 80 - 120 mg / dl Electronic Signature(s) Signed: 05/03/2023 4:35:54 PM By: Betha Loa Entered By: Betha Loa on 05/03/2023 13:57:11

## 2023-05-07 ENCOUNTER — Ambulatory Visit: Payer: Medicare HMO | Admitting: Physician Assistant

## 2023-05-10 ENCOUNTER — Ambulatory Visit: Payer: Medicare HMO | Admitting: Physician Assistant

## 2023-05-10 ENCOUNTER — Encounter: Payer: Medicare HMO | Admitting: Physician Assistant

## 2023-05-11 ENCOUNTER — Encounter: Payer: Medicare HMO | Admitting: Internal Medicine

## 2023-05-11 DIAGNOSIS — E11621 Type 2 diabetes mellitus with foot ulcer: Secondary | ICD-10-CM | POA: Diagnosis not present

## 2023-05-12 NOTE — Progress Notes (Signed)
[name], [name] ([id number]) [id number] Page 1 of 11 Visit Report for 05/11/2023 HPI Details Patient Name: Date of Service: [name] [facility] 05/11/2023 12:45 PM Medical Record Number: [id number] Patient Account Number: [id number] Date of Birth/Sex: Treating RN: 1946-10-08 (77 y.o. [name] Primary Care Provider: [name] Other Clinician: Referring Provider: Treating Provider/Extender: RO BSO N, MICHA EL Cathlean Sauer in Treatment: 46 History of Present Illness HPI Description: 09/24/2020 on evaluation today patient presents today for a heel ulcer that he tells me has been present for about 2 years. He has been seeing podiatry and they have been attempting to manage this including what sounds to be a total contact cast, Unna boot, and just standard dressings otherwise as well. Most recently has been using triple antibiotic ointment. With that being said unfortunately despite everything he really has not had any significant improvement. He tells me that he cannot even really remember exactly how this began but he presumed it may have rubbed on his shoes or something of that nature. 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 He tells me that this does seem to help. Fortunately there is no signs of active infection at this time. His most recent hemoglobin A1c was 8.1 though he knows this was this year he cannot tell me the exact time. His fluid pills currently to help with some of the lower extremity edema although he does obviously have signs of venous stasis/lymphedema. Currently there is no evidence of active infection. No fevers, chills, nausea, vomiting, or  diarrhea. Patient has had fairly recent ABIs which were performed on 07/19/2020 and revealed that he has normal findings in both the ankle and toe locations bilaterally. His ABI on the right was 1.09 on the left was 1.08 with a TBI on the right of 0.88 and on the left of 0.94. Triphasic flow was noted throughout. 10/08/2020 on evaluation today patient appears to be doing pretty well in regard to his left heel currently in fact this is doing a great job and seems to be healing quite nicely. Unfortunately on his right leg he had a pile of wood that actually fell on him injuring his right leg this is somewhat erythematous has me concerned little bit about cellulitis though there is not really a good area to culture at this point. 10/24/2020 upon evaluation today patient appears to be doing well with regard to his heel ulcer. He is showing signs of improvement which is great news. His right leg is completely healed. Overall I feel like he is doing excellent and there is no signs of infection. 11/07/2020 upon evaluation today patient appears to be doing well with regard to his heel ulcer. He tells me that last week when he was unable to come in his wife actually thought that the wound was very close to closing if not closed. Then it began to "reopen again". I really feel like what may have happened as the collagen may have dried over the wound bed and that because that misunderstanding with thinking that the wound was healing. With that being said I did not see it last week I do not know that for certain. Either way I feel like he  is doing great today I see no signs of infection at this point. 11/26/2020 upon inspection today patient appears to be doing decently well in regard to his heel ulcer. He has been tolerating the dressing changes without complication. Fortunately there is no sign of active infection at this time. No fevers, chills, nausea, vomiting, or diarrhea. 12/10/2020 upon evaluation today patient  appears to be doing fairly well in regard to the wound on his heel as well as what appears to be a new wound of the left first metatarsal head plantar aspect. This seems to be an area that was callus that has split as the patient tells me has been trying to walk on his toes more has probably where this came from. With that being said there does not appear to be signs of active infection which is great news. 12/17/2020 upon evaluation today patient actually appears to be making good progress currently. Fortunately there is no evidence of active infection at this time. Overall I feel like he is very close to complete closure. 12/26/2020 upon evaluation today patient appears to be doing excellent in regard to his wounds. In fact I am not certain that these are not even completely healed on initial inspection. Overall I am very pleased with where things stand today. Good news is that they are healed he is actually get ready to go out of town and that will be helpful as well as he will be a full part of the time. Readmission: 02/04/2021 upon evaluation today patient appears to be doing well at this point in regard to his left heel that I previously saw him for. Unfortunately he is having issues with his right lower extremity. He has significant wounds at this point he also has erythema noted there is definite signs of cellulitis which is unfortunate. With that being said I think we do need to address this sooner rather than later. The good news is he did have a nice trip to the beach. He tells me that he had no issues during that time. 4/27; patient on Bactrim. Culture showed methicillin sensitive staph aureus therefore the Bactrim should be effective. 2 small areas on the right leg are healed the area on the mid aspect of the tibia almost 100% covered in a very adherent necrotic debris. We have been using silver alginate 02/20/2021 upon evaluation today patient appears to be doing well with regard to his wound.  He is showing signs of improvement which is great news overall very pleased with where things stand today. No fevers, chills, nausea, vomiting, or diarrhea. 02/27/2021 upon evaluation today patient appears to be doing well with regard to his leg ulcer. He is tolerating the dressing changes and overall appears to be doing quite excellent. I am extremely pleased with where things stand and overall I think patient is making great progress. There is no sign of active infection at this time which is also great news. 03/06/2021 upon evaluation today patient appears to be doing excellent in regard to his wounds. In fact he appears to be completely healed today based on what I am seeing. This is excellent news and overall I am extremely pleased with where he stands. Overall the patient is happy to hear this as well this has been REMO, KIRSCHENMANN ([id number]) [id number] Page 2 of 11 quite sometime coming. Readmission: 04/01/2021 patient unfortunately returns for readmission today. He tells me that he has been wearing his compression socks on the right leg daily and he does  not really know what is going on and why his legs are doing what they are doing. We did therefore go ahead and probe deeper into exactly what has been going on with him today. Subsequently the patient tells me that when he gets up and what we would call "first thing in the morning" are really 2 different things". He does not tend to sleep well so he tells me that he will often wake up at 3:00 in the morning. He will then potentially going into the living room to get in his chair where he may read a book for a little while and then potentially fall asleep back in his chair. He then subsequently wake up around 5:00 or so and then get up and in his words "putter around". This often will end with him proceeding at some point around 8 AM or 9 AM to putting on his compression socks. With that being said this means that  anywhere from the 3:00 in the morning till roughly around 9:00 in the morning he has no compression on yet he is sitting in his recliner, walking around and up and about, and this is at least 5 to 6 hours of noncompressed time. That may be our issue here but I did not realize until we discussed this further today. Fortunately there does not appear to be any signs of active infection at this time which is great news. With that being said he has multiple wounds of the bilateral lower extremities. 04/10/2021 upon evaluation today patient appears to be doing well with regard to his legs for the most part. He does look like he had some injury where his wrap slid down nonetheless he did some "doctoring on them". I do feel like most of the areas honestly have cleared back up which is great news. There does not appear to be any signs of active infection at this time which is also great news. No fevers, chills, nausea, vomiting, or diarrhea. 04/18/2021 upon evaluation today patient appears to be doing well with regard to his wounds in fact he appears to be completely healed which is great news. Fortunately there does not appear to be any signs of active infection which is great news. No fevers, chills, nausea, vomiting, or diarrhea. READMISSION 07/28/2021 This is a now 77 year old man we have had in this clinic for quite a bit of this year. 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 He wears compression stockings. 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 He comes in an old running shoes although he says he wears better shoes at home. His history is that he noticed a blister in the right posterior heel. This open. He has been applying Neosporin to it currently the wound measures 2 x 1.4 cm. 100% slough covered. Not really offloading this in any rigorous way. Past medical history is essentially  unchanged he has paroxysmal atrial fibrillation on Coumadin type 2 diabetes with a recent hemoglobin A1c of 7 on metformin. ABI in our clinic was 0.98 on the right 08/05/2021 upon evaluation today patient appears to be doing well with regard to his heel ulcer. Fortunately there does not appear to be any signs of active infection at this time. Overall I been very pleased with where things seem to be currently as far as the wound healing is concerned. Again this is first a lot of seeing him Dr. Leanord Hawking readmitted him  last week. Nonetheless I think we are definitely making some progress here. 08/11/2021 upon evaluation today patient's wound on the heel actually showing signs of excellent improvement. I am actually very pleased with where things stand currently. No fevers, chills, nausea, vomiting, or diarrhea. There does not appear to be any need for sharp debridement today either which is also great news. 08/25/2021 upon evaluation today patient appears to be doing well with regard to his heel ulcer. This is actually looking significantly improved compared to last time I saw him. Fortunately there does not appear to be any evidence of active infection at this time. No fevers, chills, nausea, vomiting, or diarrhea. 09/01/2021 upon evaluation today patient appears to be doing decently well in regard to his wounds. Fortunately there does not appear to be any signs of active infection at this time which is great news and overall very pleased in that regard. I do not see any evidence of active infection systemically which is great news as well. No fevers, chills, nausea, vomiting, or diarrhea. I think that the heel is making excellent progress. 09/15/2021 upon evaluation today patient's heel unfortunately is significantly worse compared to what it was previous. I do believe that at this time he would benefit from switching back to something a little bit more offloading from the cushion shoe he has right now  this is more of a heel protector what I really need is a Prevalon offloading boot which I think is good to do a lot better for him than just the small heel protector. 09/23/2021 upon evaluation today patient appears to be doing a little better in regards to last week's visit. Overall I think that he is making good progress which is great news and there does not appear to be any evidence of active infection at this time. No fevers, chills, nausea, vomiting, or diarrhea. 09/30/2021 upon evaluation patient's heel is actually showing signs of good improvement which is great news. Fortunately there does not appear to be any evidence of active infection locally nor systemically at this point. No fevers, chills, nausea, vomiting, or diarrhea. 10/07/2021 upon evaluation today patient appears to be doing well currently in regard to his heel ulcer. He has been tolerating the dressing changes without complication. Fortunately I do not see any signs of active infection at this time which is great news. No fevers, chills, nausea, vomiting, or diarrhea. 12/27; wound is measuring smaller. We have been using silver alginate 10/28/2021 upon evaluation today patient appears to be doing well currently in regard to his heel ulcer. I am actually very pleased with where things stand and I think he is making excellent progress. Fortunately I do not see any signs of active infection locally nor systemically at this point which is great news. Nonetheless I do believe that the patient would benefit from a new offloading shoe and has been using it Velcro is no longer functioning properly and he tells me that he almost tripped and fell because of that today. Obviously I do not want him following that would be very bad. 11/11/2021 upon evaluation today patient appears to be doing excellent in regard to his wound. In fact this appears to be completely healed based on what I see currently. I do not see any signs of anything open or  draining and I did double check just by clearing away some of the callus around the edges of the wound to ensure that there was nothing still open or hiding underneath. Once this was  cleared away it appeared that the patient was doing significantly better at this time which is great news and there was nothing actually open. Readmission: 06-19-2022 upon evaluation today patient appears to be doing well currently in regard to his heel ulcer all things considered. He unfortunately is having a bit of breakdown in [name] as far as the heel is concerned not nearly as bad as what we noted last time he was here in the clinic. The good news is I do think that this should hopefully heal much more rapidly than what we noted last time. His past medical history has not changed he is on Coumadin. 07-03-2022 upon evaluation today patient appears to be doing better in regard to his heel. We will start to see some improvement here which is good news. Fortunately I do not see any evidence of infection locally or systemically at this time which is great news. No fevers, chills, nausea, vomiting, or diarrhea. 07-10-2022 upon evaluation today patient appears to be doing well currently in regard to his wound from the callus standpoint around the edges. Unfortunately from an actual wound bed standpoint he has some deep tissue injury noted at this point. Again I am not sure exactly what is going on that is causing this pressure to the region but he is definitely gotten pressure that is occurring and causing this to not heal as effectively as what we would like to see. I discussed with him today that he may need to at night when he sleeping use a pillow up underneath his calf in order to prevent the heel from touching the bed at all. I also think that it may be beneficial for him to monitor throughout his day and make sure there is no other time when he is getting the pressure to the heel. 07-23-2022 upon evaluation today patient  appears to be doing poorly currently in regard to his heel ulcer. He has been tolerating the dressing changes without [name], [name] ([id number]) [id number] Page 3 of 11 complication. Unfortunately I feel like he may be infected based on what I am seeing. 07-28-2022 I did review patient's culture results which showed positive for Proteus as well as Staphylococcus both of which would be treated with the Bactrim DS that I gave him at the last visit. This is good news and hopefully means that we should be able to apply the cast today as long as the wound itself does not appear to be doing poorly. Patient's wound bed actually showed signs of doing quite well and I am very pleased with where things stand and I do not see any signs of worsening overall which is great news. 08-04-2022 upon evaluation today patient appears to be doing well currently in regard to his heel ulcer. I am actually very pleased with where things stand and I do think this is looking significantly better. With regard to his right shin that is also showing signs of improvement which is great news. 08-10-2022 upon evaluation today patient's wound on the heel actually appears to be doing significantly better. In regard to the right anterior shin this is showing signs of healing it might even be completely healed but I still get a monitor I cannot get anything to fill away but also could not see where there was anything actually draining at this point. I am tending to think healed but I want a monitor 1 more week before I call it for sure. 08-17-2022 upon evaluation today patient appears to be  doing well currently in regard to his heel. Fortunately he is tolerating the dressing changes without complication. I do not see any signs of infection and overall I think he is making good progress here. We did get approval for the Apligraf but I think he had a $295 co-pay that would have to be paid per application.  With that being said as good as he is doing right now I do not think that is necessary but is still an option depending on how things progress if we need to speed this up quite a bit. 08/24/2022; this patient has a wound on his left heel in the setting of type 2 diabetes. We have been using Prisma. He has a heel offloading boot 08-31-2022 upon evaluation today patient appears to be doing poorly in regard to his heel ulcer which is still showing signs of bruising I am just not as happy as I was when I saw him 2 weeks ago with this. He saw Dr. Leanord Hawking last week. Also did not look good at that point apparently. Nonetheless I think that the patient is still continuing to have some counterpressure getting to the wound bed unfortunately. 09-07-2022 upon evaluation today patient appears to be doing well currently in regard to his heel ulcer. He has been tolerating the dressing changes without complication. Fortunately there does not appear to be any signs of active infection locally or systemically at this time. Fortunately I do not see any evidence of active infection at this time. 09-15-2022 upon evaluation today patient appears to be doing well currently in regard to his legs which in fact appear to be completely healed this is great news. With that being said his heel does have some erythema and warmth as well as drainage I am concerned about cellulitis at this location. 09-21-2022 upon evaluation today patient's wound is actually showing signs of being a little bit smaller but still we are not doing nearly as well as what I would like to see. Fortunately I do not see any signs of infection at this time which is great news and overall I am extremely pleased in that regard. With that being said I do think that he needs to continue to elevate his leg although the edema is under great control with a compression wrap I think switching to a different dressing topically may be beneficial. My suggestion is to  switch him over to a Iodoflex dressing. 09-28-2022 upon evaluation today patient appears to be doing well with regard to his heel ulcer. This is actually showing signs of improvement which is great news and overall I am extremely pleased with where we stand today. 10-06-2022 upon evaluation today patient actually appears to be making excellent progress at this point. Fortunately there does not appear to be any signs of active infection locally nor systemically which is great news and overall I am extremely pleased with where we stand. I do feel like he is actually making some pretty good progress here as we switch to the wrap along with the Iodoflex. 12/26; patient has a wound on the tip of his left heel. This is not a weightbearing surface. He is using a heel offloading boot and carefully offloading this at other times during the day. He is using Iodoflex and Zetuvitunder 3 layer compression 10-22-2022 upon evaluation today patient appears to be doing well currently in regard to his wound. He has been tolerating the dressing changes without complication. Fortunately there does not appear to be any signs  of active infection locally nor systemically at this time. No fevers, chills, nausea, vomiting, or diarrhea. 08-30-2023 upon evaluation today patient appears to be doing well currently in regard to his heel ulcer which is showing signs of good improvement. Fortunately I do not see any evidence of infection locally nor systemically which is great news and overall I am extremely pleased with where we stand. No fevers, chills, nausea, vomiting, or diarrhea. Unfortunately he does have a wound on the side of his fifth toe where I think this did rub on the shoe. 11-05-2022 upon evaluation today patient still still showing some signs of pressure at this point and there may have even been some fluid collection at this time. Fortunately I do not see any evidence of active infection locally nor systemically which  is great news and overall I am extremely pleased with where we stand. No fevers, chills, nausea, vomiting, or diarrhea. 1/25; the patient's area on the dorsal aspect of the left fifth toe is healed. We have been using Iodoflex to the area on the left heel. The lateral wound is slightly down in length. 11-19-2022 upon evaluation today patient appears to be doing well currently in regard to his wound. He has been tolerating the dressing changes without complication. Fortunately there does not appear to be any signs of infection we been using Iodoflex up to this point. 11-26-2022 upon evaluation today patient appears to be doing poorly currently in regard to his wound. He has been tolerating the dressing changes with the Iodoflex without complication. With that being said he does not have any signs of active infection systemically though locally I do feel like there is some evidence of infection here. 12-03-2022 upon evaluation today patient appears to be doing well currently in regard to his wound this is better with the Bactrim compared to last week still there was some significant slough and biofilm buildup which is going require sharp debridement at this point. Fortunately I do not see any signs of active infection locally nor systemically which is great news. 12-11-2022 upon evaluation today patient actually appears to be doing significantly better at this time in regard to his heel. I am actually very pleased with where things stand currently. There is no signs of active infection locally or systemically at this point. 12-17-2022 upon evaluation today patient appears to be doing better as far as the overall appearance of his wound. I am actually very pleased in that regard. Fortunately there does not appear to be any signs of active infection locally nor systemically at this time. I do feel like that the patient is showing signs of excellent improvement in [name]. Nonetheless he needs something better  for compression I think he should go ahead and see about getting compression socks. 3/7; tip of the left heel measurements are smaller he has been using Hydrofera Blue 01-07-2023 upon evaluation today patient appears to be doing well currently in regard to his wound. This has not been debrided since I last saw him and it definitely shows that definitely needs some debridement today. Fortunately I do not see any evidence of active infection locally nor systemically which is great news. 01-14-2023 upon evaluation today patient appears to be doing well currently although he does have some erythema and warmth to the heel I feel like he may have developed an infection yet again. This is something that is been ongoing issue we have been trying to manage his foot best we could. With that being said I  do think he may need to go back on the Bactrim this has done well for him in the past. 01-21-2023 upon evaluation today patient appears to be doing better in regard to his heel. He is doing very well with the antibiotics and that is made a big [name], [name] ([id number]) [id number] Page 4 of 11 difference for him based on what I am seeing I am very pleased with where we stand today. He is obviously doing extremely well which is great news. 01-28-2023 upon evaluation today patient appears to be doing well currently in regard to his wound. This is actually showing signs of excellent improvement I am actually very pleased with where we stand and I do believe that he is making good progress here. Fortunately I do not see any evidence of active infection locally nor systemically which is great news. No fevers, chills, nausea, vomiting, or diarrhea. 02-04-2023 upon evaluation today patient's wound is actually showed signs of excellent improvement I am actually very pleased with where we stand. I do not see any signs of active infection locally nor systemically which is great news and in  [name] I do believe that we are moving in the right direction here. No fevers, chills, nausea, vomiting, or diarrhea. 02-11-2023 upon evaluation patient's wound actually showing signs of excellent improvement I am actually very pleased with where we stand I think that his pain is better there is no signs of infection we will continue to use the gentamicin and overall we are making excellent progress. 02-18-2023 upon evaluation today patient appears to be doing well currently in regard to his wound. He has been tolerating the dressing changes without complication. Fortunately I do not see any signs of active infection locally nor systemically which is great news. 02-25-2023 upon evaluation today patient's heel actually showed signs of excellent improvement. I am actually very pleased with where we stand today and I feel like that he is making really good progress. Fortunately I do not see any signs of active infection which is good news. No fevers, chills, nausea, vomiting, or diarrhea. 03-04-2023 upon evaluation today patient appears to be doing pretty well currently in regard to his heel. He has been tolerating the dressing changes without complication. Fortunately there does not appear to be any signs of active infection locally nor systemically which is great news. No fevers, chills, nausea, vomiting, or diarrhea. 03-11-2023 upon evaluation today patient appears to be doing poorly in regard to his heel ulcer. He tells me he has been having to sleep on his back to having trouble breathing therefore I think he has been having some issues here as far as the heel is concerned it looks like he has a deep tissue injury yet again at this point. Fortunately I do not see any signs of infection which is good news but at the same time I do feel like that he is doing worse than where he was previous. I do not see any signs of active infection systemically which is great news. 03-18-2023 upon evaluation today patient  appears to be doing well currently in fact the heel is significantly better compared to last week. I am extremely pleased with where we are seeing I am hopeful that he will continue to show signs of improvement going forward. 03-25-2023 upon evaluation today patient appears to be doing well currently in regard to his wounds. He is actually showing signs of improvement on the heel and I am very pleased in that regard. Fortunately  I do not see any signs of active infection at this time which is great news. No fevers, chills, nausea, vomiting, or diarrhea. 04-05-2023 upon evaluation today patient's wound is actually showing signs of good improvement. Actually very pleased with where things stand today. Fortunately there does not appear to be any signs of active infection locally or systemically which is great news. 04-19-2023 upon evaluation today patient's wound actually looks the best it has in quite some time. He did have a wrap on for 2 weeks which I think is caused a little bit of irritation going to the forefoot everything else looks to be doing well the wrap did slide down a little bit as well. Fortunately I do not see any signs of infection however I do think that in the future if he has any issues with not being able to get scheduled to see me he should definitely make an appointment at least for a nurse visit he is aware and in agreement with plan going forward. 04-26-2023 upon evaluation today patient's wound is actually significantly smaller this is great news he is making good progress in the past really 5 or 6 visits we have been seeing improvement week by week I am very pleased in that regard. 05-03-2023 upon evaluation today patient appears to be doing excellent in regard to the wound on his heel. This is actually showing signs of improvement he continues to make good progress towards complete closure which is great news. 7/23; wound continues to do nicely. Smaller with healthy epithelialization.  This is actually a reopening we had already healed this area once and we discussed this this visit. Electronic Signature(s) Signed: 05/11/2023 4:43:46 PM By: Baltazar Najjar MD Entered By: Baltazar Najjar on 05/11/2023 13:39:40 -------------------------------------------------------------------------------- Physical Exam Details Patient Name: Date of Service: [name], [name] 05/11/2023 12:45 PM Medical Record Number: [id number] Patient Account Number: [id number] Date of Birth/Sex: Treating RN: [birth year] (77 y.o. [name] Primary Care Provider: [name] Other Clinician: Referring Provider: Treating Provider/Extender: RO BSO N, MICHA EL Cathlean Sauer in Treatment: 8 Constitutional Patient is hypertensive.. Pulse regular and within target range for patient.Marland Kitchen Respirations regular, non-labored and within target range.. Temperature is normal and within the target range for the patient.Marland Kitchen appears in no distress. [name], [name] ([id number]) [id number] Page 5 of 11 Notes Wound exam; this is a small wound near the tip of the heel. Under illumination nothing required mechanical debridement. There was no surrounding callus. Nice rim of surrounding epithelialization. No evidence of infection Electronic Signature(s) Signed: 05/11/2023 4:43:46 PM By: Baltazar Najjar MD Entered By: Baltazar Najjar on 05/11/2023 13:40:54 -------------------------------------------------------------------------------- Physician Orders Details Patient Name: Date of Service: [name], [name] 05/11/2023 12:45 PM Medical Record Number: [id number] Patient Account Number: [id number] Date of Birth/Sex: Treating RN: 06-May-1946 (77 y.o. [name] Primary Care Provider: [name] Other Clinician: Referring Provider: Treating Provider/Extender: RO BSO N, MICHA EL Cathlean Sauer in Treatment: [phone] Verbal / Phone Orders: No Diagnosis Coding Follow-up  Appointments Wound #10 Left Calcaneus Return Appointment in 1 week. Bathing/ Shower/ Hygiene May shower; gently cleanse wound with antibacterial soap, rinse and pat dry prior to dressing wounds Off-Loading Open toe surgical shoe Wound Treatment Wound #10 - Calcaneus Wound Laterality: Left Topical: Mupirocin Ointment 3 x Per Week/30 Days Discharge Instructions: Apply as directed by provider. Prim Dressing: Hydrofera Blue Ready Transfer Foam, 2.5x2.5 (in/in) (Dispense As Written) 3 x Per Week/30 Days ary Discharge Instructions: Apply Hydrofera  Blue Ready to wound bed as directed Secondary Dressing: ABD Pad 5x9 (in/in) 3 x Per Week/30 Days Discharge Instructions: Cover with ABD pad Compression Wrap: Urgo K2 Lite, two layer compression system, regular 3 x Per Week/30 Days Electronic Signature(s) Signed: 05/11/2023 4:43:46 PM By: Baltazar Najjar MD Signed: 05/12/2023 10:01:53 AM By: Elliot Gurney, BSN, RN, CWS, [name] RN, BSN Entered By: Elliot Gurney, BSN, RN, CWS, [name] on 05/11/2023 13:21:15 Barrie Folk ([id number]) [id number] Page 6 of 11 -------------------------------------------------------------------------------- Problem List Details Patient Name: Date of Service: [name] [facility] J. 05/11/2023 12:45 PM Medical Record Number: [id number] Patient Account Number: [id number] Date of Birth/Sex: Treating RN: 1945/11/15 (77 y.o. [name] Primary Care Provider: [name] Other Clinician: Referring Provider: Treating Provider/Extender: RO BSO N, MICHA EL Cathlean Sauer in Treatment: [id number] Active Problems ICD-10 Encounter Code Description Active Date MDM Diagnosis E11.621 Type 2 diabetes mellitus with foot ulcer 06/19/2022 No Yes 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sed 06/19/2022 No Yes E11.42 Type 2 diabetes mellitus with diabetic polyneuropathy 06/19/2022 No Yes Inactive Problems Resolved Problems Electronic  Signature(s) Signed: 05/11/2023 4:43:46 PM By: Baltazar Najjar MD Entered By: Baltazar Najjar on 05/11/2023 13:38:47 -------------------------------------------------------------------------------- Progress Note Details Patient Name: Date of Service: [name], [name] 05/11/2023 12:45 PM Medical Record Number: [id number] Patient Account Number: [id number] Date of Birth/Sex: Treating RN: 06-Feb-1946 (77 y.o. [name] Primary Care Provider: [name] Other Clinician: Referring Provider: Treating Provider/Extender: RO BSO N, MICHA EL Cathlean Sauer in Treatment: 46 Subjective History of Present Illness (HPI) 09/24/2020 on evaluation today patient presents today for a heel ulcer that he tells me has been present for about 2 years. He has been seeing podiatry and [name], [name] ([id number]) [id number] Page 7 of 11 they have been attempting to manage this including what sounds to be a total contact cast, Unna boot, and just standard dressings otherwise as well. Most recently has been using triple antibiotic ointment. With that being said unfortunately despite everything he really has not had any significant improvement. He tells me that he cannot even really remember exactly how this began but he presumed it may have rubbed on his shoes or something of that nature. 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 He tells me that this does seem to help. Fortunately there is no signs of active infection at this time. His most recent hemoglobin A1c was 8.1 though he knows this was this year he cannot tell me the exact time. His fluid pills currently to help with some of the lower  extremity edema although he does obviously have signs of venous stasis/lymphedema. Currently there is no evidence of active infection. No fevers, chills, nausea, vomiting, or diarrhea. Patient has had fairly recent ABIs which were performed on 07/19/2020 and revealed that he has normal findings in both the ankle and toe locations bilaterally. His ABI on the right was 1.09 on the left was 1.08 with a TBI on the right of 0.88 and on the left of 0.94. Triphasic flow was noted throughout. 10/08/2020 on evaluation today patient appears to be doing pretty well in regard to his left heel currently in fact  this is doing a great job and seems to be healing quite nicely. Unfortunately on his right leg he had a pile of wood that actually fell on him injuring his right leg this is somewhat erythematous has me concerned little bit about cellulitis though there is not really a good area to culture at this point. 10/24/2020 upon evaluation today patient appears to be doing well with regard to his heel ulcer. He is showing signs of improvement which is great news. His right leg is completely healed. Overall I feel like he is doing excellent and there is no signs of infection. 11/07/2020 upon evaluation today patient appears to be doing well with regard to his heel ulcer. He tells me that last week when he was unable to come in his wife actually thought that the wound was very close to closing if not closed. Then it began to "reopen again". I really feel like what may have happened as the collagen may have dried over the wound bed and that because that misunderstanding with thinking that the wound was healing. With that being said I did not see it last week I do not know that for certain. Either way I feel like he is doing great today I see no signs of infection at this point. 11/26/2020 upon inspection today patient appears to be doing decently well in regard to his heel ulcer. He has been tolerating the dressing changes  without complication. Fortunately there is no sign of active infection at this time. No fevers, chills, nausea, vomiting, or diarrhea. 12/10/2020 upon evaluation today patient appears to be doing fairly well in regard to the wound on his heel as well as what appears to be a new wound of the left first metatarsal head plantar aspect. This seems to be an area that was callus that has split as the patient tells me has been trying to walk on his toes more has probably where this came from. With that being said there does not appear to be signs of active infection which is great news. 12/17/2020 upon evaluation today patient actually appears to be making good progress currently. Fortunately there is no evidence of active infection at this time. Overall I feel like he is very close to complete closure. 12/26/2020 upon evaluation today patient appears to be doing excellent in regard to his wounds. In fact I am not certain that these are not even completely healed on initial inspection. Overall I am very pleased with where things stand today. Good news is that they are healed he is actually get ready to go out of town and that will be helpful as well as he will be a full part of the time. Readmission: 02/04/2021 upon evaluation today patient appears to be doing well at this point in regard to his left heel that I previously saw him for. Unfortunately he is having issues with his right lower extremity. He has significant wounds at this point he also has erythema noted there is definite signs of cellulitis which is unfortunate. With that being said I think we do need to address this sooner rather than later. The good news is he did have a nice trip to the beach. He tells me that he had no issues during that time. 4/27; patient on Bactrim. Culture showed methicillin sensitive staph aureus therefore the Bactrim should be effective. 2 small areas on the right leg are healed the area on the mid aspect of the tibia  almost 100% covered in a  very adherent necrotic debris. We have been using silver alginate 02/20/2021 upon evaluation today patient appears to be doing well with regard to his wound. He is showing signs of improvement which is great news overall very pleased with where things stand today. No fevers, chills, nausea, vomiting, or diarrhea. 02/27/2021 upon evaluation today patient appears to be doing well with regard to his leg ulcer. He is tolerating the dressing changes and overall appears to be doing quite excellent. I am extremely pleased with where things stand and overall I think patient is making great progress. There is no sign of active infection at this time which is also great news. 03/06/2021 upon evaluation today patient appears to be doing excellent in regard to his wounds. In fact he appears to be completely healed today based on what I am seeing. This is excellent news and overall I am extremely pleased with where he stands. Overall the patient is happy to hear this as well this has been quite sometime coming. Readmission: 04/01/2021 patient unfortunately returns for readmission today. He tells me that he has been wearing his compression socks on the right leg daily and he does not really know what is going on and why his legs are doing what they are doing. We did therefore go ahead and probe deeper into exactly what has been going on with him today. Subsequently the patient tells me that when he gets up and what we would call "first thing in the morning" are really 2 different things". He does not tend to sleep well so he tells me that he will often wake up at 3:00 in the morning. He will then potentially going into the living room to get in his chair where he may read a book for a little while and then potentially fall asleep back in his chair. He then subsequently wake up around 5:00 or so and then get up and in his words "putter around". This often will end with him proceeding at some  point around 8 AM or 9 AM to putting on his compression socks. With that being said this means that anywhere from the 3:00 in the morning till roughly around 9:00 in the morning he has no compression on yet he is sitting in his recliner, walking around and up and about, and this is at least 5 to 6 hours of noncompressed time. That may be our issue here but I did not realize until we discussed this further today. Fortunately there does not appear to be any signs of active infection at this time which is great news. With that being said he has multiple wounds of the bilateral lower extremities. 04/10/2021 upon evaluation today patient appears to be doing well with regard to his legs for the most part. He does look like he had some injury where his wrap slid down nonetheless he did some "doctoring on them". I do feel like most of the areas honestly have cleared back up which is great news. There does not appear to be any signs of active infection at this time which is also great news. No fevers, chills, nausea, vomiting, or diarrhea. 04/18/2021 upon evaluation today patient appears to be doing well with regard to his wounds in fact he appears to be completely healed which is great news. Fortunately there does not appear to be any signs of active infection which is great news. No fevers, chills, nausea, vomiting, or diarrhea. READMISSION 07/28/2021 This is a now 77 year old man we have had in  this clinic for quite a bit of this year. 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 He wears compression stockings. 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 He comes in an old running shoes although he says he wears better shoes at home. His history is that he noticed a blister in the right posterior heel. This open. He has been applying Neosporin to it currently the wound measures 2 x 1.4 cm. 100% slough  covered. Not really offloading this in any rigorous way. Past medical history is essentially unchanged he has paroxysmal atrial fibrillation on Coumadin type 2 diabetes with a recent hemoglobin A1c of 7 on metformin. ABI in our clinic was 0.98 on the right [name], [name] ([id number]) [id number] Page 8 of 11 08/05/2021 upon evaluation today patient appears to be doing well with regard to his heel ulcer. Fortunately there does not appear to be any signs of active infection at this time. Overall I been very pleased with where things seem to be currently as far as the wound healing is concerned. Again this is first a lot of seeing him Dr. Leanord Hawking readmitted him last week. Nonetheless I think we are definitely making some progress here. 08/11/2021 upon evaluation today patient's wound on the heel actually showing signs of excellent improvement. I am actually very pleased with where things stand currently. No fevers, chills, nausea, vomiting, or diarrhea. There does not appear to be any need for sharp debridement today either which is also great news. 08/25/2021 upon evaluation today patient appears to be doing well with regard to his heel ulcer. This is actually looking significantly improved compared to last time I saw him. Fortunately there does not appear to be any evidence of active infection at this time. No fevers, chills, nausea, vomiting, or diarrhea. 09/01/2021 upon evaluation today patient appears to be doing decently well in regard to his wounds. Fortunately there does not appear to be any signs of active infection at this time which is great news and overall very pleased in that regard. I do not see any evidence of active infection systemically which is great news as well. No fevers, chills, nausea, vomiting, or diarrhea. I think that the heel is making excellent progress. 09/15/2021 upon evaluation today patient's heel unfortunately is significantly worse compared  to what it was previous. I do believe that at this time he would benefit from switching back to something a little bit more offloading from the cushion shoe he has right now this is more of a heel protector what I really need is a Prevalon offloading boot which I think is good to do a lot better for him than just the small heel protector. 09/23/2021 upon evaluation today patient appears to be doing a little better in regards to last week's visit. Overall I think that he is making good progress which is great news and there does not appear to be any evidence of active infection at this time. No fevers, chills, nausea, vomiting, or diarrhea. 09/30/2021 upon evaluation patient's heel is actually showing signs of good improvement which is great news. Fortunately there does not appear to be any evidence of active infection locally nor systemically at this point. No fevers, chills, nausea, vomiting, or diarrhea. 10/07/2021 upon evaluation today patient appears to be doing well currently in regard to his heel ulcer. He has been tolerating the dressing changes without complication. Fortunately I do not see any  signs of active infection at this time which is great news. No fevers, chills, nausea, vomiting, or diarrhea. 12/27; wound is measuring smaller. We have been using silver alginate 10/28/2021 upon evaluation today patient appears to be doing well currently in regard to his heel ulcer. I am actually very pleased with where things stand and I think he is making excellent progress. Fortunately I do not see any signs of active infection locally nor systemically at this point which is great news. Nonetheless I do believe that the patient would benefit from a new offloading shoe and has been using it Velcro is no longer functioning properly and he tells me that he almost tripped and fell because of that today. Obviously I do not want him following that would be very bad. 11/11/2021 upon evaluation today patient  appears to be doing excellent in regard to his wound. In fact this appears to be completely healed based on what I see currently. I do not see any signs of anything open or draining and I did double check just by clearing away some of the callus around the edges of the wound to ensure that there was nothing still open or hiding underneath. Once this was cleared away it appeared that the patient was doing significantly better at this time which is great news and there was nothing actually open. Readmission: 06-19-2022 upon evaluation today patient appears to be doing well currently in regard to his heel ulcer all things considered. He unfortunately is having a bit of breakdown in [name] as far as the heel is concerned not nearly as bad as what we noted last time he was here in the clinic. The good news is I do think that this should hopefully heal much more rapidly than what we noted last time. His past medical history has not changed he is on Coumadin. 07-03-2022 upon evaluation today patient appears to be doing better in regard to his heel. We will start to see some improvement here which is good news. Fortunately I do not see any evidence of infection locally or systemically at this time which is great news. No fevers, chills, nausea, vomiting, or diarrhea. 07-10-2022 upon evaluation today patient appears to be doing well currently in regard to his wound from the callus standpoint around the edges. Unfortunately from an actual wound bed standpoint he has some deep tissue injury noted at this point. Again I am not sure exactly what is going on that is causing this pressure to the region but he is definitely gotten pressure that is occurring and causing this to not heal as effectively as what we would like to see. I discussed with him today that he may need to at night when he sleeping use a pillow up underneath his calf in order to prevent the heel from touching the bed at all. I also think that it may  be beneficial for him to monitor throughout his day and make sure there is no other time when he is getting the pressure to the heel. 07-23-2022 upon evaluation today patient appears to be doing poorly currently in regard to his heel ulcer. He has been tolerating the dressing changes without complication. Unfortunately I feel like he may be infected based on what I am seeing. 07-28-2022 I did review patient's culture results which showed positive for Proteus as well as Staphylococcus both of which would be treated with the Bactrim DS that I gave him at the last visit. This is good news and hopefully  means that we should be able to apply the cast today as long as the wound itself does not appear to be doing poorly. Patient's wound bed actually showed signs of doing quite well and I am very pleased with where things stand and I do not see any signs of worsening overall which is great news. 08-04-2022 upon evaluation today patient appears to be doing well currently in regard to his heel ulcer. I am actually very pleased with where things stand and I do think this is looking significantly better. With regard to his right shin that is also showing signs of improvement which is great news. 08-10-2022 upon evaluation today patient's wound on the heel actually appears to be doing significantly better. In regard to the right anterior shin this is showing signs of healing it might even be completely healed but I still get a monitor I cannot get anything to fill away but also could not see where there was anything actually draining at this point. I am tending to think healed but I want a monitor 1 more week before I call it for sure. 08-17-2022 upon evaluation today patient appears to be doing well currently in regard to his heel. Fortunately he is tolerating the dressing changes without complication. I do not see any signs of infection and overall I think he is making good progress here. We did get approval for  the Apligraf but I think he had a $295 co-pay that would have to be paid per application. With that being said as good as he is doing right now I do not think that is necessary but is still an option depending on how things progress if we need to speed this up quite a bit. 08/24/2022; this patient has a wound on his left heel in the setting of type 2 diabetes. We have been using Prisma. He has a heel offloading boot 08-31-2022 upon evaluation today patient appears to be doing poorly in regard to his heel ulcer which is still showing signs of bruising I am just not as happy as I was when I saw him 2 weeks ago with this. He saw Dr. Leanord Hawking last week. Also did not look good at that point apparently. Nonetheless I think that the patient is still continuing to have some counterpressure getting to the wound bed unfortunately. 09-07-2022 upon evaluation today patient appears to be doing well currently in regard to his heel ulcer. He has been tolerating the dressing changes without complication. Fortunately there does not appear to be any signs of active infection locally or systemically at this time. Fortunately I do not see any evidence of active infection at this time. 09-15-2022 upon evaluation today patient appears to be doing well currently in regard to his legs which in fact appear to be completely healed this is great news. With that being said his heel does have some erythema and warmth as well as drainage I am concerned about cellulitis at this location. 09-21-2022 upon evaluation today patient's wound is actually showing signs of being a little bit smaller but still we are not doing nearly as well as what I would like to see. Fortunately I do not see any signs of infection at this time which is great news and overall I am extremely pleased in that regard. With that being said I do think that he needs to continue to elevate his leg although the edema is under great control with a compression wrap I  think switching to a different  dressing topically may be beneficial. My suggestion is to switch him over to a Iodoflex dressing. [name], [name] ([id number]) [id number] Page 9 of 11 09-28-2022 upon evaluation today patient appears to be doing well with regard to his heel ulcer. This is actually showing signs of improvement which is great news and overall I am extremely pleased with where we stand today. 10-06-2022 upon evaluation today patient actually appears to be making excellent progress at this point. Fortunately there does not appear to be any signs of active infection locally nor systemically which is great news and overall I am extremely pleased with where we stand. I do feel like he is actually making some pretty good progress here as we switch to the wrap along with the Iodoflex. 12/26; patient has a wound on the tip of his left heel. This is not a weightbearing surface. He is using a heel offloading boot and carefully offloading this at other times during the day. He is using Iodoflex and Zetuvitunder 3 layer compression 10-22-2022 upon evaluation today patient appears to be doing well currently in regard to his wound. He has been tolerating the dressing changes without complication. Fortunately there does not appear to be any signs of active infection locally nor systemically at this time. No fevers, chills, nausea, vomiting, or diarrhea. 08-30-2023 upon evaluation today patient appears to be doing well currently in regard to his heel ulcer which is showing signs of good improvement. Fortunately I do not see any evidence of infection locally nor systemically which is great news and overall I am extremely pleased with where we stand. No fevers, chills, nausea, vomiting, or diarrhea. Unfortunately he does have a wound on the side of his fifth toe where I think this did rub on the shoe. 11-05-2022 upon evaluation today patient still still showing some signs of  pressure at this point and there may have even been some fluid collection at this time. Fortunately I do not see any evidence of active infection locally nor systemically which is great news and overall I am extremely pleased with where we stand. No fevers, chills, nausea, vomiting, or diarrhea. 1/25; the patient's area on the dorsal aspect of the left fifth toe is healed. We have been using Iodoflex to the area on the left heel. The lateral wound is slightly down in length. 11-19-2022 upon evaluation today patient appears to be doing well currently in regard to his wound. He has been tolerating the dressing changes without complication. Fortunately there does not appear to be any signs of infection we been using Iodoflex up to this point. 11-26-2022 upon evaluation today patient appears to be doing poorly currently in regard to his wound. He has been tolerating the dressing changes with the Iodoflex without complication. With that being said he does not have any signs of active infection systemically though locally I do feel like there is some evidence of infection here. 12-03-2022 upon evaluation today patient appears to be doing well currently in regard to his wound this is better with the Bactrim compared to last week still there was some significant slough and biofilm buildup which is going require sharp debridement at this point. Fortunately I do not see any signs of active infection locally nor systemically which is great news. 12-11-2022 upon evaluation today patient actually appears to be doing significantly better at this time in regard to his heel. I am actually very pleased with where things stand currently. There is no signs of active infection locally or systemically  at this point. 12-17-2022 upon evaluation today patient appears to be doing better as far as the overall appearance of his wound. I am actually very pleased in that regard. Fortunately there does not appear to be any signs of  active infection locally nor systemically at this time. I do feel like that the patient is showing signs of excellent improvement in [name]. Nonetheless he needs something better for compression I think he should go ahead and see about getting compression socks. 3/7; tip of the left heel measurements are smaller he has been using Hydrofera Blue 01-07-2023 upon evaluation today patient appears to be doing well currently in regard to his wound. This has not been debrided since I last saw him and it definitely shows that definitely needs some debridement today. Fortunately I do not see any evidence of active infection locally nor systemically which is great news. 01-14-2023 upon evaluation today patient appears to be doing well currently although he does have some erythema and warmth to the heel I feel like he may have developed an infection yet again. This is something that is been ongoing issue we have been trying to manage his foot best we could. With that being said I do think he may need to go back on the Bactrim this has done well for him in the past. 01-21-2023 upon evaluation today patient appears to be doing better in regard to his heel. He is doing very well with the antibiotics and that is made a big difference for him based on what I am seeing I am very pleased with where we stand today. He is obviously doing extremely well which is great news. 01-28-2023 upon evaluation today patient appears to be doing well currently in regard to his wound. This is actually showing signs of excellent improvement I am actually very pleased with where we stand and I do believe that he is making good progress here. Fortunately I do not see any evidence of active infection locally nor systemically which is great news. No fevers, chills, nausea, vomiting, or diarrhea. 02-04-2023 upon evaluation today patient's wound is actually showed signs of excellent improvement I am actually very pleased with where we stand. I do  not see any signs of active infection locally nor systemically which is great news and in [name] I do believe that we are moving in the right direction here. No fevers, chills, nausea, vomiting, or diarrhea. 02-11-2023 upon evaluation patient's wound actually showing signs of excellent improvement I am actually very pleased with where we stand I think that his pain is better there is no signs of infection we will continue to use the gentamicin and overall we are making excellent progress. 02-18-2023 upon evaluation today patient appears to be doing well currently in regard to his wound. He has been tolerating the dressing changes without complication. Fortunately I do not see any signs of active infection locally nor systemically which is great news. 02-25-2023 upon evaluation today patient's heel actually showed signs of excellent improvement. I am actually very pleased with where we stand today and I feel like that he is making really good progress. Fortunately I do not see any signs of active infection which is good news. No fevers, chills, nausea, vomiting, or diarrhea. 03-04-2023 upon evaluation today patient appears to be doing pretty well currently in regard to his heel. He has been tolerating the dressing changes without complication. Fortunately there does not appear to be any signs of active infection locally nor systemically which is  great news. No fevers, chills, nausea, vomiting, or diarrhea. 03-11-2023 upon evaluation today patient appears to be doing poorly in regard to his heel ulcer. He tells me he has been having to sleep on his back to having trouble breathing therefore I think he has been having some issues here as far as the heel is concerned it looks like he has a deep tissue injury yet again at this point. Fortunately I do not see any signs of infection which is good news but at the same time I do feel like that he is doing worse than where he was previous. I do not see any signs of  active infection systemically which is great news. 03-18-2023 upon evaluation today patient appears to be doing well currently in fact the heel is significantly better compared to last week. I am extremely pleased with where we are seeing I am hopeful that he will continue to show signs of improvement going forward. 03-25-2023 upon evaluation today patient appears to be doing well currently in regard to his wounds. He is actually showing signs of improvement on the heel and I am very pleased in that regard. Fortunately I do not see any signs of active infection at this time which is great news. No fevers, chills, nausea, vomiting, or diarrhea. [name], [name] ([id number]) [id number] Page 10 of 11 04-05-2023 upon evaluation today patient's wound is actually showing signs of good improvement. Actually very pleased with where things stand today. Fortunately there does not appear to be any signs of active infection locally or systemically which is great news. 04-19-2023 upon evaluation today patient's wound actually looks the best it has in quite some time. He did have a wrap on for 2 weeks which I think is caused a little bit of irritation going to the forefoot everything else looks to be doing well the wrap did slide down a little bit as well. Fortunately I do not see any signs of infection however I do think that in the future if he has any issues with not being able to get scheduled to see me he should definitely make an appointment at least for a nurse visit he is aware and in agreement with plan going forward. 04-26-2023 upon evaluation today patient's wound is actually significantly smaller this is great news he is making good progress in the past really 5 or 6 visits we have been seeing improvement week by week I am very pleased in that regard. 05-03-2023 upon evaluation today patient appears to be doing excellent in regard to the wound on his heel. This is actually showing  signs of improvement he continues to make good progress towards complete closure which is great news. 7/23; wound continues to do nicely. Smaller with healthy epithelialization. This is actually a reopening we had already healed this area once and we discussed this this visit. Objective Constitutional Patient is hypertensive.. Pulse regular and within target range for patient.Marland Kitchen Respirations regular, non-labored and within target range.. Temperature is normal and within the target range for the patient.Marland Kitchen appears in no distress. Vitals Time Taken: 12:52 PM, Height: 70 in, Weight: 265 lbs, BMI: 38, Temperature: 98.3 F, Pulse: 72 bpm, Respiratory Rate: 18 breaths/min, Blood Pressure: 121/93 mmHg. [name] Notes: Wound exam; this is a small wound near the tip of the heel. Under illumination nothing required mechanical debridement. There was no surrounding callus. Nice rim of surrounding epithelialization. No evidence of infection Integumentary (Hair, Skin) Wound #10 status is Open. Original cause of wound was  Gradually Appeared. The date acquired was: 06/05/2022. The wound has been in treatment 46 weeks. The wound is located on the Left Calcaneus. The wound measures 0.6cm length x 0.9cm width x 0.1cm depth; 0.424cm^2 area and 0.042cm^3 volume. There is Fat Layer (Subcutaneous Tissue) exposed. There is no tunneling or undermining noted. There is a medium amount of serosanguineous drainage noted. The wound margin is flat and intact. There is small (1-33%) pink granulation within the wound bed. There is a small (1-33%) amount of necrotic tissue within the wound bed including Adherent Slough. 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 There was a Double Layer Compression Therapy Procedure with a pre-treatment ABI of 1 by Huel Coventry, RN. Post procedure Diagnosis Wound #10: Same as Pre-Procedure Plan Follow-up Appointments: Wound #10 Left Calcaneus: Return Appointment in 1 week. Bathing/ Shower/ Hygiene: May shower; gently cleanse wound with antibacterial soap, rinse and pat dry prior to dressing wounds Off-Loading: Open toe surgical shoe WOUND #10: - Calcaneus Wound Laterality: Left Topical: Mupirocin Ointment 3 x Per Week/30 Days BABACAR, HAYCRAFT ([id number]) [id number] Page 11 of 11 Discharge Instructions: Apply as directed by provider. Prim Dressing: Hydrofera Blue Ready Transfer Foam, 2.5x2.5 (in/in) (Dispense As Written) 3 x Per Week/30 Days ary Discharge Instructions: Apply Hydrofera Blue Ready to wound bed as directed Secondary Dressing: ABD Pad 5x9 (in/in) 3 x Per Week/30 Days Discharge Instructions: Cover with ABD pad Com pression Wrap: Urgo K2 Lite, two layer compression system, regular 3 x Per Week/30 Days 1. We are continuing with the [facility], ABD under a Urgo K2 lite 2. Really making nice progress 3. The exact reason for the reopening was never really clear. We did put forward the idea of diabetic shoes I asked him to call his Medicare advantage plan/[employer] to see if they cover this. Otherwise he is going to need to continue to pad this area probably indefinitely. He came into the clinic in regular looking running shoes Electronic Signature(s) Signed: 05/11/2023 4:43:46 PM By: Baltazar Najjar MD Entered By: Baltazar Najjar on 05/11/2023 13:42:12 -------------------------------------------------------------------------------- SuperBill Details Patient Name: Date of Service: [name], [name] 05/11/2023 Medical Record Number: [id number] Patient Account Number: [id number] Date of Birth/Sex:  Treating RN: 1946/05/21 (77 y.o. [name] Primary Care Provider: [name] Other Clinician: Referring Provider: Treating Provider/Extender: RO BSO N, MICHA EL Cathlean Sauer in Treatment: 46 Diagnosis Coding ICD-10 Codes Code Description E11.621 Type 2 diabetes mellitus with foot ulcer 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sed E11.42 Type 2 diabetes mellitus with diabetic polyneuropathy Facility Procedures : CPT4 Code: [id number] Description: (Facility Use Only) [phone] - APPLY MULTLAY COMPRS LWR LT LEG Modifier: Quantity: 1 Physician Procedures : CPT4 Code Description Modifier [id number] - WC PHYS LEVEL 3 - EST PT ICD-10 Diagnosis Description 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sed E11.621 Type 2 diabetes mellitus with foot ulcer Quantity: 1 Electronic Signature(s) Signed: 05/11/2023 4:43:46 PM By: Baltazar Najjar MD Entered By: Baltazar Najjar on 05/11/2023 13:42:40

## 2023-05-12 NOTE — Progress Notes (Signed)
[name], [name] ([id number]) [id number] Page 1 of 9 Visit Report for 05/11/2023 Arrival Information Details Patient Name: Date of Service: [name], [name] [facility] J. 05/11/2023 12:45 PM Medical Record Number: [id number] Patient Account Number: [id number] Date of Birth/Sex: Treating RN: 14-May-1946 (77 y.o. [name] Primary Care Staley Budzinski: [name] Other Clinician: Referring Josph Norfleet: Treating Abigail Teall/Extender: RO BSO Dorris Carnes, MICHA EL Cathlean Sauer in Treatment: 46 Visit Information History Since Last Visit Added or deleted any medications: No Patient Arrived: Ambulatory Has Dressing in Place as Prescribed: Yes Arrival Time: 12:52 Has Compression in Place as Prescribed: Yes Accompanied By: self Pain Present Now: No Transfer Assistance: None Patient Identification Verified: Yes Secondary Verification Process Completed: Yes Patient Requires Transmission-Based Precautions: No Patient Has Alerts: Yes Patient Alerts: Patient on Blood Thinner Warfarin Type II Diabetic Electronic Signature(s) Signed: 05/12/2023 10:01:53 AM By: Elliot Gurney, BSN, RN, CWS, [name] RN, BSN Entered By: Elliot Gurney, BSN, RN, CWS, [name] on 05/11/2023 12:52:21 -------------------------------------------------------------------------------- Clinic Level of Care Assessment Details Patient Name: Date of Service: [name], [name] [name] 05/11/2023 12:45 PM Medical Record Number: [id number] Patient Account Number: [id number] Date of Birth/Sex: Treating RN: 30-Dec-1945 (78 y.o. [name] Primary Care Murray Durrell: [name] Other Clinician: Referring Glynn Freas: Treating Wilda Wetherell/Extender: RO BSO N, MICHA EL Cathlean Sauer in Treatment: 46 Clinic Level of Care Assessment Items TOOL 1 Quantity Score []  - 0 Use when EandM and Procedure is performed on INITIAL visit ASSESSMENTS - Nursing Assessment / Reassessment []  - 0 [name] Physical Exam (combine w/ comprehensive assessment (listed  just below) when performed on new pt. evals) []  - 0 Comprehensive Assessment (HX, ROS, Risk Assessments, Wounds Hx, etc.) ASSESSMENTS - Wound and Skin Assessment / Reassessment []  - 0 Dermatologic / Skin Assessment (not related to wound area) [name], [name] ([id number]) [id number] Page 2 of 9 ASSESSMENTS - Ostomy and/or Continence Assessment and Care []  - 0 Incontinence Assessment and Management []  - 0 Ostomy Care Assessment and Management (repouching, etc.) PROCESS - Coordination of Care []  - 0 Simple Patient / Family Education for ongoing care []  - 0 Complex (extensive) Patient / Family Education for ongoing care []  - 0 Staff obtains Chiropractor, Records, T Results / Process Orders est []  - 0 Staff telephones HHA, Nursing Homes / Clarify orders / etc []  - 0 Routine Transfer to another Facility (non-emergent condition) []  - 0 Routine Hospital Admission (non-emergent condition) []  - 0 New Admissions / Manufacturing engineer / Ordering NPWT Apligraf, etc. , []  - 0 Emergency Hospital Admission (emergent condition) PROCESS - Special Needs []  - 0 Pediatric / Minor Patient Management []  - 0 Isolation Patient Management []  - 0 Hearing / Language / Visual special needs []  - 0 Assessment of Community assistance (transportation, D/C planning, etc.) []  - 0 Additional assistance / Altered mentation []  - 0 Support Surface(s) Assessment (bed, cushion, seat, etc.) INTERVENTIONS - Miscellaneous []  - 0 External ear exam []  - 0 Patient Transfer (multiple staff / Nurse, adult / Similar devices) []  - 0 Simple Staple / Suture removal (25 or less) []  - 0 Complex Staple / Suture removal (26 or more) []  - 0 Hypo/Hyperglycemic Management (do not check if billed separately) []  - 0 Ankle / Brachial Index (ABI) - do not check if billed separately Has the patient been seen at the hospital within the last three years: Yes Total Score: 0 Level Of Care:  ____ Electronic Signature(s) Signed: 05/12/2023 10:01:53 AM By: Elliot Gurney, BSN, RN, CWS, [name] RN, BSN Entered  By: Elliot Gurney, BSN, RN, CWS, [name] on 05/11/2023 13:33:15 -------------------------------------------------------------------------------- Compression Therapy Details Patient Name: Date of Service: [name] [facility] J. 05/11/2023 12:45 PM Medical Record Number: [id number] Patient Account Number: [id number] Date of Birth/Sex: Treating RN: [birth year] (77 y.o. [name] Primary Care Cloa Bushong: [name] Other Clinician: Referring Neshawn Aird: Treating Bralee Feldt/Extender: RO BSO Dorris Carnes, MICHA EL Cathlean Sauer in Treatment: 46 Compression Therapy Performed for Wound Assessment: Wound #10 Left Calcaneus Performed By: [name] Gash, RN [name] ([id number]) [phone] Page 3 of 9 Compression Type: Double Layer Pre Treatment ABI: 1 Post Procedure Diagnosis Same as Pre-procedure Electronic Signature(s) Signed: 05/12/2023 10:01:53 AM By: Elliot Gurney, BSN, RN, CWS, [name] RN, BSN Entered By: Elliot Gurney, BSN, RN, CWS, [name] on 05/11/2023 13:20:24 -------------------------------------------------------------------------------- Encounter Discharge Information Details Patient Name: Date of Service: [name], [name] [name] 05/11/2023 12:45 PM Medical Record Number: [id number] Patient Account Number: [id number] Date of Birth/Sex: Treating RN: January 17, 1946 (77 y.o. [name] Primary Care Mical Brun: [name] Other Clinician: Referring Blayton Huttner: Treating Izea Livolsi/Extender: RO BSO N, MICHA EL Cathlean Sauer in Treatment: [phone] Encounter Discharge Information Items Discharge Condition: Stable Ambulatory Status: Walker Discharge Destination: Home Transportation: Private Auto Accompanied By: self Schedule Follow-up Appointment: Yes Clinical Summary of Care: Electronic Signature(s) Signed: 05/12/2023 10:01:53 AM By: Elliot Gurney, BSN, RN, CWS, [name] RN, BSN Entered  By: Elliot Gurney, BSN, RN, CWS, [name] on 05/11/2023 13:35:06 -------------------------------------------------------------------------------- Lower Extremity Assessment Details Patient Name: Date of Service: [name], [name] [name] 05/11/2023 12:45 PM Medical Record Number: [id number] Patient Account Number: [id number] Date of Birth/Sex: Treating RN: 1946/04/30 (77 y.o. [name] Primary Care Leanette Eutsler: [name] Other Clinician: Referring Isreal Moline: Treating Runette Scifres/Extender: RO BSO N, MICHA EL Cathlean Sauer in Treatment: 46 Edema Assessment Assessed: [Left: No] [Right: No] [Left: Edema] [name]: :] F[LeftLOWELL, [name] ([id number])] [Right: [id number] Page 4 of 9] Calf Left: Right: Point of Measurement: 33 cm From Medial Instep 42 cm Ankle Left: Right: Point of Measurement: 11 cm From Medial Instep 24 cm Vascular Assessment Pulses: Dorsalis Pedis Palpable: [Left:Yes] Notes wrap had slid down leg. Electronic Signature(s) Signed: 05/12/2023 10:01:53 AM By: Elliot Gurney, BSN, RN, CWS, [name] RN, BSN Entered By: Elliot Gurney, BSN, RN, CWS, [name] on 05/11/2023 13:02:31 -------------------------------------------------------------------------------- Multi Wound Chart Details Patient Name: Date of Service: [name], [name] [name] 05/11/2023 12:45 PM Medical Record Number: [id number] Patient Account Number: [id number] Date of Birth/Sex: Treating RN: August 15, 1946 (77 y.o. [name] Primary Care Caden Fukushima: [name] Other Clinician: Referring Merdis Snodgrass: Treating Silver Achey/Extender: RO BSO N, MICHA EL Cathlean Sauer in Treatment: 46 Vital Signs Height(in): 70 Pulse(bpm): 72 Weight(lbs): 265 Blood Pressure(mmHg): 121/93 Body Mass Index(BMI): 38 Temperature(F): 98.3 Respiratory Rate(breaths/min): 18 [10:Photos:] [N/A:N/A] Left Calcaneus N/A N/A Wound Location: Gradually Appeared N/A N/A Wounding Event: Diabetic Wound/Ulcer of the Lower N/A  N/A Primary Etiology: Extremity Pressure Ulcer N/A N/A Secondary Etiology: Cataracts, Arrhythmia, Coronary N/A N/A Comorbid History: Artery Disease, Hypertension, Type II Diabetes, Osteoarthritis, Neuropathy 06/05/2022 N/A N/A Date Acquired: 15 N/A N/A Weeks of Treatment: Open N/A N/A Wound Status: No N/A N/A Wound Recurrence: [name], [name] ([id number]) [id number] Page 5 of 9 0.6x0.9x0.1 N/A N/A Measurements L x W x D (cm) 0.424 N/A N/A A (cm) : rea 0.042 N/A N/A Volume (cm) : 70.00% N/A N/A % Reduction in A rea: 85.20% N/A N/A % Reduction in Volume: Grade 1 N/A N/A Classification: Medium N/A N/A Exudate A mount: Serosanguineous N/A N/A Exudate Type: red, brown  N/A N/A Exudate Color: Flat and Intact N/A N/A Wound Margin: Small (1-33%) N/A N/A Granulation A mount: Pink N/A N/A Granulation Quality: Small (1-33%) N/A N/A Necrotic A mount: Fat Layer (Subcutaneous Tissue): Yes N/A N/A Exposed Structures: Fascia: No Tendon: No Muscle: No Joint: No Bone: No Medium (34-66%) N/A N/A Epithelialization: callus surrounding wound N/A N/A Assessment Notes: Treatment Notes Electronic Signature(s) Signed: 05/12/2023 10:01:53 AM By: Elliot Gurney, BSN, RN, CWS, [name] RN, BSN Entered By: Elliot Gurney, BSN, RN, CWS, [name] on 05/11/2023 13:19:38 -------------------------------------------------------------------------------- Multi-Disciplinary Care Plan Details Patient Name: Date of Service: [name], [name] [name] 05/11/2023 12:45 PM Medical Record Number: [id number] Patient Account Number: [id number] Date of Birth/Sex: Treating RN: [birth year] (77 y.o. [name] Primary Care Beaulah Romanek: [name] Other Clinician: Referring Guilherme Schwenke: Treating Elissia Spiewak/Extender: RO BSO N, MICHA EL Cathlean Sauer in Treatment: [phone] Active Inactive Necrotic Tissue Nursing Diagnoses: Impaired tissue integrity related to necrotic/devitalized tissue Knowledge  deficit related to management of necrotic/devitalized tissue Goals: Necrotic/devitalized tissue will be minimized in the wound bed Date Initiated: 03/04/2023 Target Resolution Date: 03/18/2023 Goal Status: Active Patient/caregiver will verbalize understanding of reason and process for debridement of necrotic tissue Date Initiated: 03/04/2023 Target Resolution Date: 03/04/2023 Goal Status: Active Interventions: Assess patient pain level pre-, during and post procedure and prior to discharge Provide education on necrotic tissue and debridement process Treatment Activities: Excisional debridement : 03/04/2023 Notes: [name], [name] ([id number]) [id number] Page 6 of 9 Wound/Skin Impairment Nursing Diagnoses: Impaired tissue integrity Goals: Patient/caregiver will verbalize understanding of skin care regimen Date Initiated: 06/19/2022 Date Inactivated: 07/10/2022 Target Resolution Date: 06/19/2022 Goal Status: Met Ulcer/skin breakdown will have a volume reduction of 30% by week 4 Date Initiated: 06/19/2022 Date Inactivated: 07/28/2022 Target Resolution Date: 07/17/2022 Goal Status: Met Ulcer/skin breakdown will have a volume reduction of 50% by week 8 Date Initiated: 07/28/2022 Target Resolution Date: 03/20/2023 Goal Status: Active Interventions: Assess patient/caregiver ability to obtain necessary supplies Assess patient/caregiver ability to perform ulcer/skin care regimen upon admission and as needed Assess ulceration(s) every visit Provide education on ulcer and skin care Treatment Activities: Skin care regimen initiated : 06/19/2022 Topical wound management initiated : 06/19/2022 Notes: Electronic Signature(s) Signed: 05/12/2023 10:01:53 AM By: Elliot Gurney, BSN, RN, CWS, [name] RN, BSN Entered By: Elliot Gurney, BSN, RN, CWS, [name] on 05/11/2023 13:33:37 -------------------------------------------------------------------------------- Pain Assessment Details Patient Name: Date of  Service: [name], [name] [name] 05/11/2023 12:45 PM Medical Record Number: [id number] Patient Account Number: [id number] Date of Birth/Sex: Treating RN: 1945-11-02 (77 y.o. [name] Primary Care Yussef Jorge: [name] Other Clinician: Referring Edwin Baines: Treating Minoru Chap/Extender: RO BSO N, MICHA EL Cathlean Sauer in Treatment: [phone] Active Problems Location of Pain Severity and Description of Pain Patient Has Paino No Site Locations [name], [name] ([id number]) [id number] Page 7 of 9 Pain Management and Medication Current Pain Management: Electronic Signature(s) Signed: 05/12/2023 10:01:53 AM By: Elliot Gurney, BSN, RN, CWS, [name] RN, BSN Entered By: Elliot Gurney, BSN, RN, CWS, [name] on 05/11/2023 12:54:58 -------------------------------------------------------------------------------- Patient/Caregiver Education Details Patient Name: Date of Service: [name], [name] [name] 7/23/2024andnbsp12:45 PM Medical Record Number: [id number] Patient Account Number: [id number] Date of Birth/Gender: Treating RN: Jul 05, 1946 (77 y.o. [name] Primary Care Physician: [name] Other Clinician: Referring Physician: Treating Physician/Extender: RO BSO Dorris Carnes, MICHA EL Cathlean Sauer in Treatment: 84 Education Assessment Education Provided To: Patient Education Topics Provided Venous: Handouts: Controlling Swelling with Compression Stockings Methods: Explain/Verbal Responses: State content correctly Wound/Skin Impairment: Handouts: Caring for Your Ulcer,  Other: continue wound care as prescribed Methods: Explain/Verbal Responses: State content correctly Electronic Signature(s) Signed: 05/12/2023 10:01:53 AM By: Elliot Gurney, BSN, RN, CWS, [name] RN, BSN Entered By: Elliot Gurney, BSN, RN, CWS, [name] on 05/11/2023 13:34:10 [name] ([id number]) [id number] Page 8 of  9 -------------------------------------------------------------------------------- Wound Assessment Details Patient Name: Date of Service: [name] [facility] 05/11/2023 12:45 PM Medical Record Number: [id number] Patient Account Number: [id number] Date of Birth/Sex: Treating RN: 17-May-1946 (77 y.o. [name], [name] Primary Care Riku Buttery: [name] Other Clinician: Referring Tracey Stewart: Treating Jebidiah Baggerly/Extender: RO BSO N, MICHA EL Cathlean Sauer in Treatment: 46 Wound Status Wound Number: 10 Primary Diabetic Wound/Ulcer of the Lower Extremity Etiology: Wound Location: Left Calcaneus Secondary Pressure Ulcer Wounding Event: Gradually Appeared Etiology: Date Acquired: 06/05/2022 Wound Open Weeks Of Treatment: 46 Status: Clustered Wound: No Comorbid Cataracts, Arrhythmia, Coronary Artery Disease, Hypertension, History: Type II Diabetes, Osteoarthritis, Neuropathy Photos Wound Measurements Length: (cm) 0.6 Width: (cm) 0.9 Depth: (cm) 0.1 Area: (cm) 0.4 Volume: (cm) 0.0 % Reduction in Area: 70% % Reduction in Volume: 85.2% Epithelialization: Medium (34-66%) 24 Tunneling: No 42 Undermining: No Wound Description Classification: Grade 1 Wound Margin: Flat and Intact Exudate Amount: Medium Exudate Type: Serosanguineous Exudate Color: red, brown Foul Odor After Cleansing: No Slough/Fibrino Yes Wound Bed Granulation Amount: Small (1-33%) Exposed Structure Granulation Quality: Pink Fascia Exposed: No Necrotic Amount: Small (1-33%) Fat Layer (Subcutaneous Tissue) Exposed: Yes Necrotic Quality: Adherent Slough Tendon Exposed: No Muscle Exposed: No Joint Exposed: No Bone Exposed: No Assessment Notes callus surrounding wound Treatment Notes Wound #10 (Calcaneus) Wound Laterality: Left [name], [name] ([id number]) [id number] Page 9 of 9 Cleanser Peri-Wound Care Topical Mupirocin Ointment Discharge Instruction: Apply as directed by  Sary Bogie. Primary Dressing Hydrofera Blue Ready Transfer Foam, 2.5x2.5 (in/in) Discharge Instruction: Apply Hydrofera Blue Ready to wound bed as directed Secondary Dressing ABD Pad 5x9 (in/in) Discharge Instruction: Cover with ABD pad Secured With Compression Wrap Urgo K2 Lite, two layer compression system, regular Compression Stockings Add-Ons Electronic Signature(s) Signed: 05/12/2023 10:01:53 AM By: Elliot Gurney, BSN, RN, CWS, [name] RN, BSN Entered By: Elliot Gurney, BSN, RN, CWS, [name] on 05/11/2023 13:01:10 -------------------------------------------------------------------------------- Vitals Details Patient Name: Date of Service: [name], [name] [name] 05/11/2023 12:45 PM Medical Record Number: [id number] Patient Account Number: [id number] Date of Birth/Sex: Treating RN: 1946-02-08 (77 y.o. [name] Primary Care Kerrianne Jeng: [name] Other Clinician: Referring Adolph Clutter: Treating Manford Sprong/Extender: RO BSO N, MICHA EL Cathlean Sauer in Treatment: 46 Vital Signs Time Taken: 12:52 Temperature (F): 98.3 Height (in): 70 Pulse (bpm): 72 Weight (lbs): 265 Respiratory Rate (breaths/min): 18 Body Mass Index (BMI): 38 Blood Pressure (mmHg): 121/93 Reference Range: 80 - 120 mg / dl Electronic Signature(s) Signed: 05/12/2023 10:01:53 AM By: Elliot Gurney, BSN, RN, CWS, [name] RN, BSN Entered By: Elliot Gurney, BSN, RN, CWS, [name] on 05/11/2023 12:52:49

## 2023-05-14 ENCOUNTER — Ambulatory Visit: Payer: Medicare HMO | Admitting: Physician Assistant

## 2023-05-17 ENCOUNTER — Encounter: Payer: Medicare HMO | Admitting: Physician Assistant

## 2023-05-18 ENCOUNTER — Encounter: Payer: Medicare HMO | Admitting: Physician Assistant

## 2023-05-18 DIAGNOSIS — E11621 Type 2 diabetes mellitus with foot ulcer: Secondary | ICD-10-CM | POA: Diagnosis not present

## 2023-05-18 NOTE — Progress Notes (Signed)
[name], [name] ([id number]) [id number] Page 1 of 3 Visit Report for 05/18/2023 Chief Complaint Document Details Patient Name: Date of Service: [name], [name] 05/18/2023 3:15 PM Medical Record Number: [id number] Patient Account Number: [id number] Date of Birth/Sex: Treating RN: [birth year] (77 y.o. [name] Petit) [name] Primary Care Provider: Aram [name] Other Clinician: Referring Provider: Treating Provider/Extender: [name] in Treatment: 77 Information Obtained from: Patient Chief Complaint Left heel ulcer Electronic Signature(s) Signed: 05/18/2023 3:12:04 PM By: [name] Derry PA-C Entered By: [name] on 05/18/2023 15:12:04 -------------------------------------------------------------------------------- Debridement Details Patient Name: Date of Service: [name], [name] [name] 05/18/2023 3:15 PM Medical Record Number: [id number] Patient Account Number: [id number] Date of Birth/Sex: Treating RN: 12/09/1945 (77 y.o. [name] Petit) [name] Primary Care Provider: Aram [name] Other Clinician: Referring Provider: Treating Provider/Extender: [name] in Treatment: 77 Debridement Performed for Assessment: Wound #10 Left Calcaneus Performed By: Physician [name] Derry, PA-C Debridement Type: Debridement Severity of Tissue Pre Debridement: Fat layer exposed Level of Consciousness (Pre-procedure): Awake and Alert Pre-procedure Verification/Time Out Yes - 15:45 Taken: Start Time: 15:45 Percent of Wound Bed Debrided: 100% T Area Debrided (cm): otal 0.35 Tissue and other material debrided: Viable, Non-Viable, Slough, Subcutaneous, Skin: Dermis , Slough Level: Skin/Subcutaneous Tissue Debridement Description: Excisional Instrument: Curette Bleeding: Minimum End Time: 15:48 Procedural Pain: 0 Post Procedural Pain: 0 Response to Treatment: Procedure was tolerated well [name], [name] ([id number])  [id number] Page 2 of 3 Level of Consciousness (Post- Awake and Alert procedure): Post Debridement Measurements of Total Wound Length: (cm) 0.5 Width: (cm) 0.9 Depth: (cm) 0.1 Volume: (cm) 0.035 Character of Wound/Ulcer Post Debridement: Improved Severity of Tissue Post Debridement: Fat layer exposed Post Procedure Diagnosis Same as Pre-procedure Electronic Signature(s) Unsigned Entered ByYevonne Pax on 05/18/2023 15:47:43 -------------------------------------------------------------------------------- Physician Orders Details Patient Name: Date of Service: [name] [name] J. 05/18/2023 3:15 PM Medical Record Number: [id number] Patient Account Number: [id number] Date of Birth/Sex: Treating RN: 1946/09/04 (77 y.o. 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 Bathing/ Shower/ Hygiene May shower; gently cleanse wound with antibacterial soap, rinse and pat dry prior to dressing wounds Off-Loading Open toe surgical shoe Wound Treatment Wound #10 - Calcaneus Wound Laterality: Left Topical: Mupirocin Ointment 3 x Per Week/30 Days Discharge Instructions: Apply as directed by provider. Prim Dressing: Hydrofera Blue Ready Transfer Foam, 2.5x2.5 (in/in) (Dispense As Written) 3 x Per Week/30 Days ary Discharge Instructions: Apply Hydrofera Blue Ready to wound bed as directed Secondary Dressing: ABD Pad 5x9 (in/in) 3 x Per Week/30 Days Discharge Instructions: Cover with ABD pad [name], [name] ([id number]) [id number] Page 3 of 3 Compression Wrap: Urgo K2 Lite, two layer compression system, regular 3 x Per Week/30 Days Electronic Signature(s) Signed: 05/18/2023 3:24:02 PM By: [name] RN Entered By: [name] on 05/18/2023 15:24:01 -------------------------------------------------------------------------------- Problem List Details Patient Name: Date of Service: [name], [name] [name] 05/18/2023 3:15 PM Medical Record Number: [id number] Patient Account Number: [id number] Date of Birth/Sex: Treating RN: 10-12-1946 (76 y.o. [name] Primary Care Provider: Aram [name] Other Clinician: Referring Provider: Treating Provider/Extender: [name]  [name] Weeks in Treatment: 77 Active Problems ICD-10 Encounter Code Description Active Date MDM Diagnosis E11.621 Type 2 diabetes mellitus with foot ulcer 06/19/2022 No Yes 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sed 06/19/2022 No Yes E11.42 Type 2 diabetes mellitus with diabetic polyneuropathy 06/19/2022 No Yes Inactive Problems Resolved Problems Electronic Signature(s) Signed: 05/18/2023 3:12:01 PM By: [name] Derry PA-C Entered By: [name] on 05/18/2023 15:12:01

## 2023-05-19 NOTE — Progress Notes (Signed)
JUDAH, ELLETT ([id number]) [id number] Page 1 of 9 Visit Report for 05/18/2023 Arrival Information Details Patient Name: Date of Service: [name], GEO [name] 05/18/2023 3:15 PM Medical Record Number: [id number] Patient Account Number: [id number] Date of Birth/Sex: Treating RN: [birth year] (77 y.o. [name]) [name] Primary Care Cassidi Modesitt: [name] Other Clinician: Referring Raiza Kiesel: Treating Sallie Maker/Extender: [name] in Treatment: 61 Visit Information History Since Last Visit Added or deleted any medications: No Patient Arrived: Ambulatory Any new allergies or adverse reactions: No Arrival Time: 15:09 Had a fall or experienced change in No Accompanied By: self activities of daily living that may affect Transfer Assistance: None risk of falls: Patient Identification Verified: Yes Signs or symptoms of abuse/neglect since last visito No Secondary Verification Process Completed: Yes Hospitalized since last visit: No Patient Requires Transmission-Based Precautions: No Implantable device outside of the clinic excluding No Patient Has Alerts: Yes cellular tissue based products placed in the center Patient Alerts: Patient on Blood Thinner since last visit: Warfarin Has Dressing in Place as Prescribed: Yes Type II Diabetic Has Compression in Place as Prescribed: Yes Pain Present Now: No Electronic Signature(s) Signed: 05/19/2023 4:21:14 PM By: [name] RN Entered By: [name] on 05/18/2023 15:09:45 -------------------------------------------------------------------------------- Clinic Level of Care Assessment Details Patient Name: Date of Service: [name] 05/18/2023 3:15 PM Medical Record Number: [id number] Patient Account Number: [id number] Date of Birth/Sex: Treating RN: [birth year] (77 y.o. [name]) [name] Primary Care Anaia Frith: [name] Other Clinician: Referring Nilan Iddings: Treating  Naksh Radi/Extender: [name] in Treatment: 47 Clinic Level of Care Assessment Items TOOL 1 Quantity Score []  - 0 Use when EandM and Procedure is performed on INITIAL visit ASSESSMENTS - Nursing Assessment / Reassessment []  - 0 General Physical Exam (combine w/ comprehensive assessment (listed just below) when performed on new pt. evals) []  - 0 Comprehensive Assessment (HX, ROS, Risk Assessments, Wounds Hx, etc.) DAEGAN, RYCROFT ([id number]) [phone] Page 2 of 9 ASSESSMENTS - Wound and Skin Assessment / Reassessment []  - 0 Dermatologic / Skin Assessment (not related to wound area) ASSESSMENTS - Ostomy and/or Continence Assessment and Care []  - 0 Incontinence Assessment and Management []  - 0 Ostomy Care Assessment and Management (repouching, etc.) PROCESS - Coordination of Care []  - 0 Simple Patient / Family Education for ongoing care []  - 0 Complex (extensive) Patient / Family Education for ongoing care []  - 0 Staff obtains Chiropractor, Records, T Results / Process Orders est []  - 0 Staff telephones HHA, Nursing Homes / Clarify orders / etc []  - 0 Routine Transfer to another Facility (non-emergent condition) []  - 0 Routine [name] Admission (non-emergent condition) []  - 0 New Admissions / Manufacturing engineer / Ordering NPWT Apligraf, etc. , []  - 0 Emergency [name] Admission (emergent condition) PROCESS - Special Needs []  - 0 Pediatric / Minor Patient Management []  - 0 Isolation Patient Management []  - 0 Hearing / Language / Visual special needs []  - 0 Assessment of Community assistance (transportation, D/C planning, etc.) []  - 0 Additional assistance / Altered mentation []  - 0 Support Surface(s) Assessment (bed, cushion, seat, etc.) INTERVENTIONS - Miscellaneous []  - 0 External ear exam []  - 0 Patient Transfer (multiple staff / Nurse, adult / Similar devices) []  - 0 Simple Staple / Suture removal (25 or  less) []  - 0 Complex Staple / Suture removal (26 or more) []  - 0 Hypo/Hyperglycemic Management (do not check if billed separately) []  - 0 Ankle / Brachial Index (ABI) -  do not check if billed separately Has the patient been seen at the [name] within the last three years: Yes Total Score: 0 Level Of Care: ____ Electronic Signature(s) Signed: 05/19/2023 4:21:14 PM By: [name] RN Entered By: [name] on 05/18/2023 15:47:56 -------------------------------------------------------------------------------- Encounter Discharge Information Details Patient Name: Date of Service: [name], [name] 05/18/2023 3:15 PM Medical Record Number: [id number] Patient Account Number: [id number] Date of Birth/Sex: Treating RN: 01/10/1946 (76 y.o. [name] Primary Care Sheika Coutts: [name] Other Clinician: Referring Vega Stare: Treating [name]/Extender: [name] ([id number]) [id number] Page 3 of 9 Weeks in Treatment: 47 Encounter Discharge Information Items Post Procedure Vitals Discharge Condition: Stable Temperature (F): 98.2 Ambulatory Status: Walker Pulse (bpm): 69 Discharge Destination: Home Respiratory Rate (breaths/min): 18 Transportation: Private Auto Blood Pressure (mmHg): 123/62 Accompanied By: self Schedule Follow-up Appointment: Yes Clinical Summary of Care: Electronic Signature(s) Signed: 05/19/2023 4:21:14 PM By: [name] RN Entered By: [name] on 05/18/2023 15:48:52 -------------------------------------------------------------------------------- Lower Extremity Assessment Details Patient Name: Date of Service: [name] J. 05/18/2023 3:15 PM Medical Record Number: [id number] Patient Account Number: [id number] Date of Birth/Sex: Treating RN: 22-May-1946 (77 y.o. [name]) [name] Primary Care [name]: [name] Other Clinician: Referring [name]: Treating [name]/Extender:  Hermine Messick Weeks in Treatment: 47 Edema Assessment Assessed: [Left: No] [Right: No] Edema: [Left: Ye] [Right: s] Calf Left: Right: Point of Measurement: 33 cm From Medial Instep 41 cm Ankle Left: Right: Point of Measurement: 11 cm From Medial Instep 24 cm Vascular Assessment Pulses: Dorsalis Pedis Palpable: [Left:Yes] Extremity colors, hair growth, and conditions: Hair Growth on Extremity: [Left:No] Temperature of Extremity: [Left:Warm] Capillary Refill: [Left:< 3 seconds] Dependent Rubor: [Left:No] Blanched when Elevated: [Left:No No] Toe Nail Assessment Left: Right: Thick: Yes Discolored: Yes Deformed: Yes Improper Length and Hygiene: Yes Electronic Signature(s) DAVIONE, FEEMSTER ([id number]) [id number] Page 4 of 9 Signed: 05/19/2023 4:21:14 PM By: [name] RN Entered By: [name] on 05/18/2023 15:17:52 -------------------------------------------------------------------------------- Multi Wound Chart Details Patient Name: Date of Service: [name], [name] 05/18/2023 3:15 PM Medical Record Number: [id number] Patient Account Number: [id number] Date of Birth/Sex: Treating RN: August 11, 1946 (77 y.o. [name]) [name] Primary Care Arden Tinoco: [name] Other Clinician: Referring Fynlee Rowlands: Treating Kordelia Severin/Extender: [name] in Treatment: 27 Vital Signs Height(in): 70 Pulse(bpm): 69 Weight(lbs): 265 Blood Pressure(mmHg): 123/62 Body Mass Index(BMI): 38 Temperature(F): 98.2 Respiratory Rate(breaths/min): 18 [10:Photos:] [N/A:N/A] Left Calcaneus N/A N/A Wound Location: Gradually Appeared N/A N/A Wounding 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 N/A N/A Secondary Etiology: Cataracts, Arrhythmia, Coronary N/A N/A Comorbid History: Artery Disease, Hypertension, Type II Diabetes, Osteoarthritis, Neuropathy 06/05/2022 N/A N/A Date  Acquired: 30 N/A N/A Weeks of Treatment: Open N/A N/A Wound Status: No N/A N/A Wound Recurrence: 0.5x0.9x0.1 N/A N/A Measurements L x W x D (cm) 0.353 N/A N/A A (cm) : rea 0.035 N/A N/A Volume (cm) : 75.00% N/A N/A % Reduction in A rea: 87.60% N/A N/A % Reduction in Volume: Grade 1 N/A N/A Classification: Medium N/A N/A Exudate A mount: Serosanguineous N/A N/A Exudate Type: red, brown N/A N/A Exudate Color: Flat and Intact N/A N/A Wound Margin: Small (1-33%) N/A N/A Granulation A mount: Pink N/A N/A Granulation Quality: Small (1-33%) N/A N/A Necrotic A mount: Fat Layer (Subcutaneous Tissue): Yes N/A N/A Exposed Structures: Fascia: No Tendon: No Muscle: No Joint: No Bone: No Medium (34-66%) N/A N/A Epithelialization: Treatment Notes Radoncic, Keller J (  [id number]) [id number] Page 5 of 9 Electronic Signature(s) Signed: 05/19/2023 4:21:14 PM By: [name] RN Entered By: [name] on 05/18/2023 15:18:01 -------------------------------------------------------------------------------- Multi-Disciplinary Care Plan Details Patient Name: Date of Service: [name], [name] 05/18/2023 3:15 PM Medical Record Number: [id number] Patient Account Number: [id number] Date of Birth/Sex: Treating RN: June 30, 1946 (77 y.o. [name]) [name] Primary Care Loralei Radcliffe: [name] Other Clinician: Referring Mileydi Milsap: Treating Camelle Henkels/Extender: [name] in Treatment: 4 Active Inactive Wound/Skin Impairment Nursing Diagnoses: Impaired tissue integrity Goals: Patient/caregiver will verbalize understanding of skin care regimen Date Initiated: 06/19/2022 Target Resolution Date: 07/19/2022 Goal Status: Active Ulcer/skin breakdown will have a volume reduction of 30% by week 4 Date Initiated: 06/19/2022 Date Inactivated: 07/28/2022 Target Resolution Date: 07/17/2022 Goal Status: Unmet Unmet Reason: infection Ulcer/skin  breakdown will have a volume reduction of 50% by week 8 Date Initiated: 07/28/2022 Date Inactivated: 05/18/2023 Target Resolution Date: 03/20/2023 Goal Status: Unmet Unmet Reason: comorbidities Interventions: Assess patient/caregiver ability to obtain necessary supplies Assess patient/caregiver ability to perform ulcer/skin care regimen upon admission and as needed Assess ulceration(s) every visit Provide education on ulcer and skin care Treatment Activities: Skin care regimen initiated : 06/19/2022 Topical wound management initiated : 06/19/2022 Notes: Electronic Signature(s) Signed: 05/19/2023 4:21:14 PM By: [name] RN Entered By: [name] on 05/18/2023 15:19:19 Barrie Folk ([id number]) [id number] Page 6 of 9 -------------------------------------------------------------------------------- Pain Assessment Details Patient Name: Date of Service: Ollen Gross 05/18/2023 3:15 PM Medical Record Number: [id number] Patient Account Number: [id number] Date of Birth/Sex: Treating RN: [birth year] (77 y.o. [name]) [name] Primary Care Capricia Serda: [name] Other Clinician: Referring Kentley Cedillo: Treating Roarke Marciano/Extender: [name] in Treatment: 11 Active Problems Location of Pain Severity and Description of Pain Patient Has Paino No Site Locations Pain Management and Medication Current Pain Management: Electronic Signature(s) Signed: 05/19/2023 4:21:14 PM By: [name] RN Entered By: [name] on 05/18/2023 15:10:22 -------------------------------------------------------------------------------- Patient/Caregiver Education Details Patient Name: Date of Service: [name], GEO Cherrie Gauze 7/30/2024andnbsp3:15 PM Medical Record Number: [id number] Patient Account Number: [id number] Date of Birth/Gender: Treating RN: 05-24-1946 (76 y.o. [name] Primary Care Physician: [name] Other Clinician: Referring  Physician: Treating Physician/Extender: [name] in Treatment: [address] ([id number]) [id number] Page 7 of 9 Education Assessment Education Provided To: Patient Education Topics Provided Wound/Skin Impairment: Handouts: Caring for Your Ulcer Methods: Explain/Verbal Responses: State content correctly Electronic Signature(s) Signed: 05/19/2023 4:21:14 PM By: [name] RN Entered By: [name] on 05/18/2023 15:19:38 -------------------------------------------------------------------------------- Wound Assessment Details Patient Name: Date of Service: [name], [name] 05/18/2023 3:15 PM Medical Record Number: [id number] Patient Account Number: [id number] Date of Birth/Sex: Treating RN: 1946/09/05 (77 y.o. 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 Location: Left Calcaneus Secondary Pressure Ulcer Wounding Event: Gradually Appeared Etiology: Date Acquired: 06/05/2022 Wound Open Weeks Of Treatment: 47 Status: Clustered Wound: No Comorbid Cataracts, Arrhythmia, Coronary Artery Disease, Hypertension, History: Type II Diabetes, Osteoarthritis, Neuropathy Photos Wound Measurements Length: (cm) 0.5 Width: (cm) 0.9 Depth: (cm) 0.1 Area: (cm) 0.353 Volume: (cm) 0.035 % Reduction in Area: 75% % Reduction in Volume: 87.6% Epithelialization: Medium (34-66%) Tunneling: No Undermining: No Wound Description Classification: Grade 1 Wound Margin: Flat and Intact CANE, NAEEM ([id number]) Exudate Amount: Medium Exudate Type: Serosanguineous Exudate Color: red, brown  Foul Odor After Cleansing: No Slough/Fibrino Yes [id number] Page 8 of 9 Wound  Bed Granulation Amount: Small (1-33%) Exposed Structure Granulation Quality: Pink Fascia Exposed: No Necrotic Amount: Small (1-33%) Fat Layer (Subcutaneous Tissue) Exposed: Yes Necrotic Quality: Adherent Slough Tendon Exposed: No Muscle Exposed: No Joint Exposed: No Bone Exposed: No Treatment Notes Wound #10 (Calcaneus) Wound Laterality: Left Cleanser Peri-Wound Care Topical Mupirocin Ointment Discharge Instruction: Apply as directed by Kyshawn Teal. Primary Dressing Hydrofera Blue Ready Transfer Foam, 2.5x2.5 (in/in) Discharge Instruction: Apply Hydrofera Blue Ready to wound bed as directed Secondary Dressing ABD Pad 5x9 (in/in) Discharge Instruction: Cover with ABD pad Secured With Compression Wrap Urgo K2 Lite, two layer compression system, regular Compression Stockings Add-Ons Electronic Signature(s) Signed: 05/19/2023 4:21:14 PM By: [name] RN Entered By: [name] on 05/18/2023 15:16:29 -------------------------------------------------------------------------------- Vitals Details Patient Name: Date of Service: [name], [name] 05/18/2023 3:15 PM Medical Record Number: [id number] Patient Account Number: [id number] Date of Birth/Sex: Treating RN: 30-[name]-1946 (77 y.o. [name]) [name] Primary Care Emelda Kohlbeck: [name] Other Clinician: Referring Anyi Fels: Treating Issabelle Mcraney/Extender: [name] in Treatment: 47 Vital Signs Time Taken: 15:09 Temperature (F): 98.2 Height (in): 70 Pulse (bpm): 69 Weight (lbs): 265 Respiratory Rate (breaths/min): 18 Body Mass Index (BMI): 38 Blood Pressure (mmHg): 123/62 Reference Range: 80 - 120 mg / dl CHAROD, LINQUIST ([id number]) [id number] Page 9 of 9 Electronic Signature(s) Signed: 05/19/2023 4:21:14 PM By: [name] RN Entered By: [name] on 05/18/2023 15:10:09

## 2023-05-24 ENCOUNTER — Encounter: Payer: Medicare HMO | Attending: Physician Assistant | Admitting: Physician Assistant

## 2023-05-24 DIAGNOSIS — Z952 Presence of prosthetic heart valve: Secondary | ICD-10-CM | POA: Insufficient documentation

## 2023-05-24 DIAGNOSIS — Z7901 Long term (current) use of anticoagulants: Secondary | ICD-10-CM | POA: Diagnosis not present

## 2023-05-24 DIAGNOSIS — E1142 Type 2 diabetes mellitus with diabetic polyneuropathy: Secondary | ICD-10-CM | POA: Diagnosis not present

## 2023-05-24 DIAGNOSIS — I872 Venous insufficiency (chronic) (peripheral): Secondary | ICD-10-CM | POA: Insufficient documentation

## 2023-05-24 DIAGNOSIS — L97522 Non-pressure chronic ulcer of other part of left foot with fat layer exposed: Secondary | ICD-10-CM | POA: Insufficient documentation

## 2023-05-24 DIAGNOSIS — G8929 Other chronic pain: Secondary | ICD-10-CM | POA: Insufficient documentation

## 2023-05-24 DIAGNOSIS — I480 Paroxysmal atrial fibrillation: Secondary | ICD-10-CM | POA: Insufficient documentation

## 2023-05-24 DIAGNOSIS — M1990 Unspecified osteoarthritis, unspecified site: Secondary | ICD-10-CM | POA: Insufficient documentation

## 2023-05-24 DIAGNOSIS — I10 Essential (primary) hypertension: Secondary | ICD-10-CM | POA: Diagnosis not present

## 2023-05-24 DIAGNOSIS — I2510 Atherosclerotic heart disease of native coronary artery without angina pectoris: Secondary | ICD-10-CM | POA: Diagnosis not present

## 2023-05-24 DIAGNOSIS — E11621 Type 2 diabetes mellitus with foot ulcer: Secondary | ICD-10-CM | POA: Insufficient documentation

## 2023-05-24 NOTE — Progress Notes (Signed)
[name], [name] ([id number]) [id number] Page 1 of 9 Visit Report for 05/24/2023 Arrival Information Details Patient Name: Date of Service: [name] 05/24/2023 3:45 PM Medical Record Number: [id number] Patient Account Number: [id number] Date of Birth/Sex: Treating RN: 08/17/1946 (76 y.o. [name]) [name] Primary Care : [name] Other Clinician: Betha [name] Referring : Treating /Extender: [name] in Treatment: 48 Visit Information History Since Last Visit All ordered tests and consults were completed: No Patient Arrived: [name] Added or deleted any medications: No Arrival Time: 15:49 Any new allergies or adverse reactions: No Transfer Assistance: None Had a fall or experienced change in No Patient Identification Verified: Yes activities of daily living that may affect Secondary Verification Process Completed: Yes risk of falls: Patient Requires Transmission-Based Precautions: No Signs or symptoms of abuse/neglect since last visito No Patient Has Alerts: Yes Hospitalized since last visit: No Patient Alerts: Patient on Blood Thinner Implantable device outside of the clinic excluding No Warfarin cellular tissue based products placed in the [name] Type II Diabetic since last visit: Has Dressing in Place as Prescribed: Yes Has Compression in Place as Prescribed: Yes Pain Present Now: No Electronic Signature(s) Unsigned Entered ByBetha [name] on 05/24/2023 15:56:36 -------------------------------------------------------------------------------- Clinic Level of Care Assessment Details Patient Name: Date of Service: [name] [facility] 05/24/2023 3:45 PM Medical Record Number: [id number] Patient Account Number: [id number] Date of Birth/Sex: Treating RN: 1946-04-19 (76 y.o. [name] Primary Care : [name] Other Clinician: Betha [name] Referring  : Treating /Extender: [name] in Treatment: 48 Clinic Level of Care Assessment Items TOOL 1 Quantity Score []  - 0 Use when EandM and Procedure is performed on INITIAL visit ASSESSMENTS - Nursing Assessment / Reassessment [name] ([id number]) [id number] Page 2 of 9 []  - 0 [name] Physical Exam (combine w/ comprehensive assessment (listed just below) when performed on new pt. evals) []  - 0 Comprehensive Assessment (HX, ROS, Risk Assessments, Wounds Hx, etc.) ASSESSMENTS - Wound and Skin Assessment / Reassessment []  - 0 Dermatologic / Skin Assessment (not related to wound area) ASSESSMENTS - Ostomy and/or Continence Assessment and Care []  - 0 Incontinence Assessment and Management []  - 0 Ostomy Care Assessment and Management (repouching, etc.) PROCESS - Coordination of Care []  - 0 Simple Patient / Family Education for ongoing care []  - 0 Complex (extensive) Patient / Family Education for ongoing care []  - 0 Staff obtains Chiropractor, Records, T Results / Process Orders est []  - 0 Staff telephones HHA, Nursing Homes / Clarify orders / etc []  - 0 Routine Transfer to another Facility (non-emergent condition) []  - 0 Routine Hospital Admission (non-emergent condition) []  - 0 New Admissions / Manufacturing engineer / Ordering NPWT Apligraf, etc. , []  - 0 Emergency Hospital Admission (emergent condition) PROCESS - Special Needs []  - 0 Pediatric / Minor Patient Management []  - 0 Isolation Patient Management []  - 0 Hearing / Language / Visual special needs []  - 0 Assessment of Community assistance (transportation, D/C planning, etc.) []  - 0 Additional assistance / Altered mentation []  - 0 Support Surface(s) Assessment (bed, cushion, seat, etc.) INTERVENTIONS - Miscellaneous []  - 0 External ear exam []  - 0 Patient Transfer (multiple staff / Nurse, adult / Similar devices) []  - 0 Simple Staple /  Suture removal (25 or less) []  - 0 Complex Staple / Suture removal (26 or more) []  - 0 Hypo/Hyperglycemic Management (do not check if billed separately) []  - 0 Ankle / Brachial  Index (ABI) - do not check if billed separately Has the patient been seen at the hospital within the last three years: Yes Total Score: 0 Level Of Care: ____ Electronic Signature(s) Unsigned Entered By: [name] on 05/24/2023 16:42:01 -------------------------------------------------------------------------------- Compression Therapy Details Patient Name: Date of Service: [name], [name] [name] 05/24/2023 3:45 PM Barrie [name] ([id number]) [id number] Page 3 of 9 Medical Record Number: [id number] Patient Account Number: [id number] Date of Birth/Sex: Treating RN: [birth year] (76 y.o. [name]) [name] Primary Care : [name] Other Clinician: Betha [name] Referring : Treating /Extender: [name] Weeks in Treatment: 48 Compression Therapy Performed for Wound Assessment: Wound #10 Left Calcaneus Performed By: [name], [name], Compression Type: Double Layer Pre Treatment ABI: 1 Post Procedure Diagnosis Same as Pre-procedure Electronic Signature(s) Unsigned Entered By: [name] on 05/24/2023 16:41:28 -------------------------------------------------------------------------------- Encounter Discharge Information Details Patient Name: Date of Service: [name] [name] 05/24/2023 3:45 PM Medical Record Number: [id number] Patient Account Number: [id number] Date of Birth/Sex: Treating RN: 04/19/1946 (76 y.o. [name] Primary Care : [name] Other Clinician: Betha [name] Referring : Treating /Extender: [name] in Treatment: 48 Encounter Discharge Information Items Post Procedure Vitals Discharge Condition: Stable Temperature (F): 98.4 Ambulatory  Status: Walker Pulse (bpm): 71 Discharge Destination: Home Respiratory Rate (breaths/min): 18 Transportation: Private Auto Blood Pressure (mmHg): 137/66 Accompanied By: self Schedule Follow-up Appointment: Yes Clinical Summary of Care: Electronic Signature(s) Unsigned Entered ByBetha [name] on 05/24/2023 17:07:08 -------------------------------------------------------------------------------- Lower Extremity Assessment Details Patient Name: Date of Service: [name], [name] [name] 05/24/2023 3:45 PM Barrie [name] ([id number]) [id number] Page 4 of 9 Medical Record Number: [id number] Patient Account Number: [id number] Date of Birth/Sex: Treating RN: 04/10/1946 (76 y.o. [name]) [name] Primary Care : [name] Other Clinician: Betha [name] Referring : Treating /Extender: [name] Weeks in Treatment: 48 Edema Assessment Assessed: [Left: Yes] [Right: No] Edema: [Left: Ye] [Right: s] Calf Left: Right: Point of Measurement: 33 cm From Medial Instep 40.8 cm Ankle Left: Right: Point of Measurement: 11 cm From Medial Instep 22.4 cm Vascular Assessment Pulses: Dorsalis Pedis Palpable: [Left:Yes] Toe Nail Assessment Left: Right: Thick: Yes Discolored: Yes Deformed: No Electronic Signature(s) Unsigned Entered ByBetha [name] on 05/24/2023 16:12:12 -------------------------------------------------------------------------------- Multi Wound Chart Details Patient Name: Date of Service: [name] 05/24/2023 3:45 PM Medical Record Number: [id number] Patient Account Number: [id number] Date of Birth/Sex: Treating RN: June 26, 1946 (76 y.o. [name]) [name] Primary Care : [name] Other Clinician: Betha [name] Referring : Treating /Extender: [name] in Treatment: 48 Vital Signs Height(in): 70 Pulse(bpm): 71 Weight(lbs): 265 Blood  Pressure(mmHg): 137/66 Body Mass Index(BMI): 38 Temperature(F): 98.4 Respiratory Rate(breaths/min): 18 [10:Photos:] [N/A:N/A] Left Calcaneus N/A N/A Wound Location: Gradually Appeared N/A N/A Wounding 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 N/A N/A Secondary Etiology: Cataracts, Arrhythmia, Coronary N/A N/A Comorbid History: Artery Disease, Hypertension, Type II Diabetes, Osteoarthritis, Neuropathy 06/05/2022 N/A N/A Date Acquired: 48 N/A N/A Weeks of Treatment: Open N/A N/A Wound Status: No N/A N/A Wound Recurrence: 1.5x0.6x0.1 N/A N/A Measurements L x W x D (cm) 0.707 N/A N/A A (cm) : rea 0.071 N/A N/A Volume (cm) : 50.00% N/A N/A % Reduction in A rea: 74.90% N/A N/A % Reduction in Volume: Grade 1 N/A N/A Classification: Medium N/A N/A Exudate A mount: Serosanguineous N/A N/A Exudate Type: red, brown N/A N/A Exudate Color: Flat and Intact N/A  N/A Wound Margin: Small (1-33%) N/A N/A Granulation A mount: Pink N/A N/A Granulation Quality: Small (1-33%) N/A N/A Necrotic A mount: Fat Layer (Subcutaneous Tissue): Yes N/A N/A Exposed Structures: Fascia: No Tendon: No Muscle: No Joint: No Bone: No Medium (34-66%) N/A N/A Epithelialization: Treatment Notes Electronic Signature(s) Unsigned Entered ByBetha [name] on 05/24/2023 16:37:14 -------------------------------------------------------------------------------- Multi-Disciplinary Care Plan Details Patient Name: Date of Service: [name] [facility] 05/24/2023 3:45 PM Medical Record Number: [id number] Patient Account Number: [id number] Date of Birth/Sex: Treating RN: 1946-10-05 (76 y.o. [name]) [name] Primary Care : [name] Other Clinician: Betha [name] Referring : Treating /Extender: [name] in Treatment: 48 Active Inactive Wound/Skin Impairment Nursing Diagnoses: Impaired tissue  integrity [name], [name] ([id number]) [id number] Page 6 of 9 Goals: Patient/caregiver will verbalize understanding of skin care regimen Date Initiated: 06/19/2022 Target Resolution Date: 07/19/2022 Goal Status: Active Ulcer/skin breakdown will have a volume reduction of 30% by week 4 Date Initiated: 06/19/2022 Date Inactivated: 07/28/2022 Target Resolution Date: 07/17/2022 Goal Status: Unmet Unmet Reason: infection Ulcer/skin breakdown will have a volume reduction of 50% by week 8 Date Initiated: 07/28/2022 Date Inactivated: 05/18/2023 Target Resolution Date: 03/20/2023 Goal Status: Unmet Unmet Reason: comorbidities Interventions: Assess patient/caregiver ability to obtain necessary supplies Assess patient/caregiver ability to perform ulcer/skin care regimen upon admission and as needed Assess ulceration(s) every visit Provide education on ulcer and skin care Treatment Activities: Skin care regimen initiated : 06/19/2022 Topical wound management initiated : 06/19/2022 Notes: Electronic Signature(s) Unsigned Entered By: [name] on 05/24/2023 17:05:51 -------------------------------------------------------------------------------- Pain Assessment Details Patient Name: Date of Service: [name] 05/24/2023 3:45 PM Medical Record Number: [id number] Patient Account Number: [id number] Date of Birth/Sex: Treating RN: Mar 31, 1946 (76 y.o. [name] Primary Care : [name] Other Clinician: Betha [name] Referring : Treating /Extender: [name] in Treatment: 48 Active Problems Location of Pain Severity and Description of Pain Patient Has Paino No Site Locations Pain Management and Medication [name], [name] ([id number]) [id number] Page 7 of 9 Current Pain Management: Electronic Signature(s) Unsigned Entered By: [name] on 05/24/2023  15:59:33 -------------------------------------------------------------------------------- Patient/Caregiver Education Details Patient Name: Date of Service: [name] [name] 8/5/2024andnbsp3:45 PM Medical Record Number: [id number] Patient Account Number: [id number] Date of Birth/Gender: Treating RN: [birth year] (76 y.o. [name]) [name] Primary Care Physician: [name] Other Clinician: Betha [name] Referring Physician: Treating Physician/Extender: [name] in Treatment: 27 Education Assessment Education Provided To: Patient Education Topics Provided Wound/Skin Impairment: Handouts: Other: continue wound care as directed Methods: Explain/Verbal Responses: State content correctly Electronic Signature(s) Unsigned Entered By: [name] on 05/24/2023 17:06:09 -------------------------------------------------------------------------------- Wound Assessment Details Patient Name: Date of Service: [name] 05/24/2023 3:45 PM Medical Record Number: [id number] Patient Account Number: [id number] Date of Birth/Sex: Treating RN: 05-13-1946 (76 y.o. [name] Primary Care : [name] Other Clinician: Betha [name] Referring : Treating /Extender: [name] in Treatment: [address] ([id number]) [id number] Page 8 of 9 Wound Status Wound Number: 10 Primary Diabetic Wound/Ulcer of the Lower Extremity Etiology: Wound Location: Left Calcaneus Secondary Pressure Ulcer Wounding Event: Gradually Appeared Etiology: Date Acquired: 06/05/2022 Wound Open Weeks Of Treatment: 48 Status: Clustered Wound: No Comorbid Cataracts, Arrhythmia, Coronary Artery Disease, Hypertension, History: Type II Diabetes, Osteoarthritis, Neuropathy Photos Wound Measurements Length: (cm) 1.5 Width: (cm) 0.6 Depth: (cm) 0.1 Area: (cm) 0.707 Volume: (cm) 0.071 %  Reduction in Area: 50% % Reduction  in Volume: 74.9% Epithelialization: Medium (34-66%) Wound Description Classification: Grade 1 Wound Margin: Flat and Intact Exudate Amount: Medium Exudate Type: Serosanguineous Exudate Color: red, brown Foul Odor After Cleansing: No Slough/Fibrino Yes Wound Bed Granulation Amount: Small (1-33%) Exposed Structure Granulation Quality: Pink Fascia Exposed: No Necrotic Amount: Small (1-33%) Fat Layer (Subcutaneous Tissue) Exposed: Yes Necrotic Quality: Adherent Slough Tendon Exposed: No Muscle Exposed: No Joint Exposed: No Bone Exposed: No Treatment Notes Wound #10 (Calcaneus) Wound Laterality: Left Cleanser Peri-Wound Care Topical Mupirocin Ointment Discharge Instruction: Apply as directed by . Primary Dressing Hydrofera Blue Ready Transfer Foam, 2.5x2.5 (in/in) Discharge Instruction: Apply Hydrofera Blue Ready to wound bed as directed Secondary Dressing ABD Pad 5x9 (in/in) Discharge Instruction: Cover with ABD pad Secured With Compression Wrap Urgo K2 Lite, two layer compression system, regular Compression Stockings Add-Ons AMOGH, BEAUMAN ([id number]) [id number] Page 9 of 9 Electronic Signature(s) Unsigned Entered By: [name] on 05/24/2023 16:11:07 -------------------------------------------------------------------------------- Vitals Details Patient Name: Date of Service: [name] [facility]. 05/24/2023 3:45 PM Medical Record Number: [id number] Patient Account Number: [id number] Date of Birth/Sex: Treating RN: [birth year] (76 y.o. [name]) [name] Primary Care : [name] Other Clinician: Betha [name] Referring : Treating /Extender: [name] in Treatment: 48 Vital Signs Time Taken: 15:56 Temperature (F): 98.4 Height (in): 70 Pulse (bpm): 71 Weight (lbs): 265 Respiratory Rate (breaths/min): 18 Body Mass Index (BMI): 38 Blood  Pressure (mmHg): 137/66 Reference Range: 80 - 120 mg / dl Electronic Signature(s) Unsigned Entered ByBetha [name] on 05/24/2023 15:59:23 Signature(s): Date(s):

## 2023-05-24 NOTE — Progress Notes (Addendum)
STELLAR, ROG ([id number]) [id number] Page 1 of 13 Visit Report for 05/24/2023 Chief Complaint Document Details Patient Name: Date of Service: [name] 05/24/2023 3:45 PM Medical Record Number: [id number] Patient Account Number: [id number] Date of Birth/Sex: Treating RN: May 26, 1946 (76 y.o. [name]) [name] Primary Care Provider: [name] Other Clinician: Betha Loa Referring Provider: Treating Provider/Extender: [name] in Treatment: 48 Information Obtained from: Patient Chief Complaint Left heel ulcer Electronic Signature(s) Signed: 05/24/2023 3:36:53 PM By: [name] Derry PA-C Entered By: [name] Derry on 05/24/2023 15:36:53 -------------------------------------------------------------------------------- Debridement Details Patient Name: Date of Service: [name], [name] 05/24/2023 3:45 PM Medical Record Number: [id number] Patient Account Number: [id number] Date of Birth/Sex: Treating RN: September 16, 1946 (76 y.o. [name]) [name] Primary Care Provider: [name] Other Clinician: Betha Loa Referring Provider: Treating Provider/Extender: [name] in Treatment: 48 Debridement Performed for Assessment: Wound #10 Left Calcaneus Performed By: Physician [name] Derry, PA-C Debridement Type: Debridement Severity of Tissue Pre Debridement: Fat layer exposed Level of Consciousness (Pre-procedure): Awake and Alert Pre-procedure Verification/Time Out Yes - 16:37 Taken: Start Time: 16:37 Percent of Wound Bed Debrided: 100% T Area Debrided (cm): otal 0.71 Tissue and other material debrided: Viable, Non-Viable, Callus, Slough, Subcutaneous, Slough Level: Skin/Subcutaneous Tissue Debridement Description: Excisional Instrument: Curette Bleeding: Minimum Hemostasis Achieved: Pressure Response to Treatment: Procedure was tolerated well Level of Consciousness (Post- Awake and  Alert procedure): EZRIEL, HARBEN ([id number]) [id number] Page 2 of 13 Post Debridement Measurements of Total Wound Length: (cm) 1.5 Width: (cm) 0.6 Depth: (cm) 0.1 Volume: (cm) 0.071 Character of Wound/Ulcer Post Debridement: Improved Severity of Tissue Post Debridement: Fat layer exposed Post Procedure Diagnosis Same as Pre-procedure Electronic Signature(s) Signed: 05/24/2023 6:13:17 PM By: [name] Derry PA-C Signed: 05/25/2023 3:47:31 PM By: [name] RN Signed: 05/26/2023 5:47:33 PM By: Betha Loa Entered By: Betha Loa on 05/24/2023 16:39:50 -------------------------------------------------------------------------------- HPI Details Patient Name: Date of Service: [name], [name] 05/24/2023 3:45 PM Medical Record Number: [id number] Patient Account Number: [id number] Date of Birth/Sex: Treating RN: [birth year] (76 y.o. [name] Primary Care Provider: [name] Other Clinician: Betha Loa Referring Provider: Treating Provider/Extender: [name] in Treatment: 48 History of Present Illness HPI Description: 09/24/2020 on evaluation today patient presents today for a heel ulcer that he tells me has been present for about 2 years. He has been seeing podiatry and they have been attempting to manage this including what sounds to be a total contact cast, Unna boot, and just standard dressings otherwise as well. Most recently has been using triple antibiotic ointment. With that being said unfortunately despite everything he really has not had any significant improvement. He tells me that he cannot even really remember exactly how this began but he presumed it may have rubbed on his shoes or something of that nature. 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 He tells me that this does seem to help. Fortunately there is no signs of active infection at this time. His most recent hemoglobin A1c was 8.1 though he knows this was this year he cannot tell me the exact time. His fluid pills currently to help with some of the lower  extremity edema although he does obviously have signs of venous stasis/lymphedema. Currently there is no evidence of active infection. No fevers, chills, nausea, vomiting, or diarrhea. Patient has had fairly recent ABIs which were performed on 07/19/2020 and revealed that he has normal findings in both the ankle and toe locations bilaterally. His ABI on the right was 1.09 on the left was 1.08 with a TBI on the right of 0.88 and on the left of 0.94. Triphasic flow was noted throughout. 10/08/2020 on evaluation today patient appears to be doing pretty well in regard to his left heel currently in fact this is doing a great job and seems to be healing quite nicely. Unfortunately on his right leg he had a pile of wood that actually fell on him injuring his right leg this is somewhat erythematous has me concerned little bit about cellulitis though there is not really a good area to culture at this point. 10/24/2020 upon evaluation today patient appears to be doing well with regard to his heel ulcer. He is showing signs of improvement which is great news. His right leg is completely healed. Overall I feel like he is doing excellent and there is no signs of infection. 11/07/2020 upon evaluation today patient appears to be doing well with regard to his heel ulcer. He tells me that last week when he was unable to come in his wife actually thought that the wound was very close to closing if not closed. Then it began to "reopen again". I really feel like what may have happened as the collagen may have dried over the wound bed  and that because that misunderstanding with thinking that the wound was healing. With that being said I did not see it last week I do not know that for certain. Either way I feel like he is doing great today I see no signs of infection at this point. 11/26/2020 upon inspection today patient appears to be doing decently well in regard to his heel ulcer. He has been tolerating the dressing changes without complication. Fortunately there is no sign of active infection at this time. No fevers, chills, nausea, vomiting, or diarrhea. 12/10/2020 upon evaluation today patient appears to be doing fairly well in regard to the wound on his heel as well as what appears to be a new wound of the left first metatarsal head plantar aspect. This seems to be an area that was callus that has split as the patient tells me has been trying to walk on his toes more has probably where this came from. With that being said there does not appear to be signs of active infection which is great news. 12/17/2020 upon evaluation today patient actually appears to be making good progress currently. Fortunately there is no evidence of active infection at this time. Overall I feel like he is very close to complete closure. 12/26/2020 upon evaluation today patient appears to be doing excellent in regard to his wounds. In fact I am not certain that these are not even completely healed on initial inspection. Overall I am very pleased with where things stand today. Good news is that they are healed he is actually get ready to go out of BASILIO, SCHLICHTING ([id number]) [id number] Page 3 of 10 town and that will be helpful as well as he will be a full part of the time. Readmission: 02/04/2021 upon evaluation today patient appears to be doing well at this point in regard to his left heel that I previously saw him  for. Unfortunately he is having issues with his right lower extremity. He has significant wounds at this point he  also has erythema noted there is definite signs of cellulitis which is unfortunate. With that being said I think we do need to address this sooner rather than later. The good news is he did have a nice trip to the beach. He tells me that he had no issues during that time. 4/27; patient on Bactrim. Culture showed methicillin sensitive staph aureus therefore the Bactrim should be effective. 2 small areas on the right leg are healed the area on the mid aspect of the tibia almost 100% covered in a very adherent necrotic debris. We have been using silver alginate 02/20/2021 upon evaluation today patient appears to be doing well with regard to his wound. He is showing signs of improvement which is great news overall very pleased with where things stand today. No fevers, chills, nausea, vomiting, or diarrhea. 02/27/2021 upon evaluation today patient appears to be doing well with regard to his leg ulcer. He is tolerating the dressing changes and overall appears to be doing quite excellent. I am extremely pleased with where things stand and overall I think patient is making great progress. There is no sign of active infection at this time which is also great news. 03/06/2021 upon evaluation today patient appears to be doing excellent in regard to his wounds. In fact he appears to be completely healed today based on what I am seeing. This is excellent news and overall I am extremely pleased with where he stands. Overall the patient is happy to hear this as well this has been quite sometime coming. Readmission: 04/01/2021 patient unfortunately returns for readmission today. He tells me that he has been wearing his compression socks on the right leg daily and he does not really know what is going on and why his legs are doing what they are doing. We did therefore go ahead and probe deeper into exactly what has been going on with him today. Subsequently the patient tells me that when he gets up and what we would call  "first thing in the morning" are really 2 different things". He does not tend to sleep well so he tells me that he will often wake up at 3:00 in the morning. He will then potentially going into the living room to get in his chair where he may read a book for a little while and then potentially fall asleep back in his chair. He then subsequently wake up around 5:00 or so and then get up and in his words "putter around". This often will end with him proceeding at some point around 8 AM or 9 AM to putting on his compression socks. With that being said this means that anywhere from the 3:00 in the morning till roughly around 9:00 in the morning he has no compression on yet he is sitting in his recliner, walking around and up and about, and this is at least 5 to 6 hours of noncompressed time. That may be our issue here but I did not realize until we discussed this further today. Fortunately there does not appear to be any signs of active infection at this time which is great news. With that being said he has multiple wounds of the bilateral lower extremities. 04/10/2021 upon evaluation today patient appears to be doing well with regard to his legs for the most part. He does look like he had some injury where his wrap slid down  nonetheless he did some "doctoring on them". I do feel like most of the areas honestly have cleared back up which is great news. There does not appear to be any signs of active infection at this time which is also great news. No fevers, chills, nausea, vomiting, or diarrhea. 04/18/2021 upon evaluation today patient appears to be doing well with regard to his wounds in fact he appears to be completely healed which is great news. Fortunately there does not appear to be any signs of active infection which is great news. No fevers, chills, nausea, vomiting, or diarrhea. READMISSION 07/28/2021 This is a now 77 year old man we have had in this clinic for quite a bit of this year. He has been  in here with bilateral lower extremity leg wounds probably secondary to chronic venous insufficiency. He wears compression stockings. 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 He comes in an old running shoes although he says he wears better shoes at home. His history is that he noticed a blister in the right posterior heel. This open. He has been applying Neosporin to it currently the wound measures 2 x 1.4 cm. 100% slough covered. Not really offloading this in any rigorous way. Past medical history is essentially unchanged he has paroxysmal atrial fibrillation on Coumadin type 2 diabetes with a recent hemoglobin A1c of 7 on metformin. ABI in our clinic was 0.98 on the right 08/05/2021 upon evaluation today patient appears to be doing well with regard to his heel ulcer. Fortunately there does not appear to be any signs of active infection at this time. Overall I been very pleased with where things seem to be currently as far as the wound healing is concerned. Again this is first a lot of seeing him Dr. Leanord Hawking readmitted him last week. Nonetheless I think we are definitely making some progress here. 08/11/2021 upon evaluation today patient's wound on the heel actually showing signs of excellent improvement. I am actually very pleased with where things stand currently. No fevers, chills, nausea, vomiting, or diarrhea. There does not appear to be any need for sharp debridement today either which is also great news. 08/25/2021 upon evaluation today patient appears to be doing well with regard to his heel ulcer. This is actually looking significantly improved compared to last time I saw him. Fortunately there does not appear to be any evidence of active infection at this time. No fevers, chills, nausea, vomiting, or diarrhea. 09/01/2021 upon evaluation today patient appears to be doing decently well in regard to his wounds. Fortunately there does not appear to be  any signs of active infection at this time which is great news and overall very pleased in that regard. I do not see any evidence of active infection systemically which is great news as well. No fevers, chills, nausea, vomiting, or diarrhea. I think that the heel is making excellent progress. 09/15/2021 upon evaluation today patient's heel unfortunately is significantly worse compared to what it was previous. I do believe that at this time he would benefit from switching back to something a little bit more offloading from the cushion shoe he has right now this is more of a heel protector what I really need is a Prevalon offloading boot which I think is good to do a lot better for him than just the small heel protector. 09/23/2021 upon evaluation today patient appears to be doing a little better in regards to last week's visit. Overall I think that he  is making good progress which is great news and there does not appear to be any evidence of active infection at this time. No fevers, chills, nausea, vomiting, or diarrhea. 09/30/2021 upon evaluation patient's heel is actually showing signs of good improvement which is great news. Fortunately there does not appear to be any evidence of active infection locally nor systemically at this point. No fevers, chills, nausea, vomiting, or diarrhea. 10/07/2021 upon evaluation today patient appears to be doing well currently in regard to his heel ulcer. He has been tolerating the dressing changes without complication. Fortunately I do not see any signs of active infection at this time which is great news. No fevers, chills, nausea, vomiting, or diarrhea. 12/27; wound is measuring smaller. We have been using silver alginate 10/28/2021 upon evaluation today patient appears to be doing well currently in regard to his heel ulcer. I am actually very pleased with where things stand and I think he is making excellent progress. Fortunately I do not see any signs of active  infection locally nor systemically at this point which is great news. Nonetheless I do believe that the patient would benefit from a new offloading shoe and has been using it Velcro is no longer functioning properly and he tells me that he almost tripped and fell because of that today. Obviously I do not want him following that would be very bad. SENICA, NILO ([id number]) [id number] Page 4 of 13 11/11/2021 upon evaluation today patient appears to be doing excellent in regard to his wound. In fact this appears to be completely healed based on what I see currently. I do not see any signs of anything open or draining and I did double check just by clearing away some of the callus around the edges of the wound to ensure that there was nothing still open or hiding underneath. Once this was cleared away it appeared that the patient was doing significantly better at this time which is great news and there was nothing actually open. Readmission: 06-19-2022 upon evaluation today patient appears to be doing well currently in regard to his heel ulcer all things considered. He unfortunately is having a bit of breakdown in general as far as the heel is concerned not nearly as bad as what we noted last time he was here in the clinic. The good news is I do think that this should hopefully heal much more rapidly than what we noted last time. His past medical history has not changed he is on Coumadin. 07-03-2022 upon evaluation today patient appears to be doing better in regard to his heel. We will start to see some improvement here which is good news. Fortunately I do not see any evidence of infection locally or systemically at this time which is great news. No fevers, chills, nausea, vomiting, or diarrhea. 07-10-2022 upon evaluation today patient appears to be doing well currently in regard to his wound from the callus standpoint around the edges. Unfortunately from an actual wound bed  standpoint he has some deep tissue injury noted at this point. Again I am not sure exactly what is going on that is causing this pressure to the region but he is definitely gotten pressure that is occurring and causing this to not heal as effectively as what we would like to see. I discussed with him today that he may need to at night when he sleeping use a pillow up underneath his calf in order to prevent the heel from touching the bed at all.  I also think that it may be beneficial for him to monitor throughout his day and make sure there is no other time when he is getting the pressure to the heel. 07-23-2022 upon evaluation today patient appears to be doing poorly currently in regard to his heel ulcer. He has been tolerating the dressing changes without complication. Unfortunately I feel like he may be infected based on what I am seeing. 07-28-2022 I did review patient's culture results which showed positive for Proteus as well as Staphylococcus both of which would be treated with the Bactrim DS that I gave him at the last visit. This is good news and hopefully means that we should be able to apply the cast today as long as the wound itself does not appear to be doing poorly. Patient's wound bed actually showed signs of doing quite well and I am very pleased with where things stand and I do not see any signs of worsening overall which is great news. 08-04-2022 upon evaluation today patient appears to be doing well currently in regard to his heel ulcer. I am actually very pleased with where things stand and I do think this is looking significantly better. With regard to his right shin that is also showing signs of improvement which is great news. 08-10-2022 upon evaluation today patient's wound on the heel actually appears to be doing significantly better. In regard to the right anterior shin this is showing signs of healing it might even be completely healed but I still get a monitor I cannot get  anything to fill away but also could not see where there was anything actually draining at this point. I am tending to think healed but I want a monitor 1 more week before I call it for sure. 08-17-2022 upon evaluation today patient appears to be doing well currently in regard to his heel. Fortunately he is tolerating the dressing changes without complication. I do not see any signs of infection and overall I think he is making good progress here. We did get approval for the Apligraf but I think he had a $295 co-pay that would have to be paid per application. With that being said as good as he is doing right now I do not think that is necessary but is still an option depending on how things progress if we need to speed this up quite a bit. 08/24/2022; this patient has a wound on his left heel in the setting of type 2 diabetes. We have been using Prisma. He has a heel offloading boot 08-31-2022 upon evaluation today patient appears to be doing poorly in regard to his heel ulcer which is still showing signs of bruising I am just not as happy as I was when I saw him 2 weeks ago with this. He saw Dr. Leanord Hawking last week. Also did not look good at that point apparently. Nonetheless I think that the patient is still continuing to have some counterpressure getting to the wound bed unfortunately. 09-07-2022 upon evaluation today patient appears to be doing well currently in regard to his heel ulcer. He has been tolerating the dressing changes without complication. Fortunately there does not appear to be any signs of active infection locally or systemically at this time. Fortunately I do not see any evidence of active infection at this time. 09-15-2022 upon evaluation today patient appears to be doing well currently in regard to his legs which in fact appear to be completely healed this is great news. With that being  said his heel does have some erythema and warmth as well as drainage I am concerned about  cellulitis at this location. 09-21-2022 upon evaluation today patient's wound is actually showing signs of being a little bit smaller but still we are not doing nearly as well as what I would like to see. Fortunately I do not see any signs of infection at this time which is great news and overall I am extremely pleased in that regard. With that being said I do think that he needs to continue to elevate his leg although the edema is under great control with a compression wrap I think switching to a different dressing topically may be beneficial. My suggestion is to switch him over to a Iodoflex dressing. 09-28-2022 upon evaluation today patient appears to be doing well with regard to his heel ulcer. This is actually showing signs of improvement which is great news and overall I am extremely pleased with where we stand today. 10-06-2022 upon evaluation today patient actually appears to be making excellent progress at this point. Fortunately there does not appear to be any signs of active infection locally nor systemically which is great news and overall I am extremely pleased with where we stand. I do feel like he is actually making some pretty good progress here as we switch to the wrap along with the Iodoflex. 12/26; patient has a wound on the tip of his left heel. This is not a weightbearing surface. He is using a heel offloading boot and carefully offloading this at other times during the day. He is using Iodoflex and Zetuvitunder 3 layer compression 10-22-2022 upon evaluation today patient appears to be doing well currently in regard to his wound. He has been tolerating the dressing changes without complication. Fortunately there does not appear to be any signs of active infection locally nor systemically at this time. No fevers, chills, nausea, vomiting, or diarrhea. 08-30-2023 upon evaluation today patient appears to be doing well currently in regard to his heel ulcer which is showing signs of good  improvement. Fortunately I do not see any evidence of infection locally nor systemically which is great news and overall I am extremely pleased with where we stand. No fevers, chills, nausea, vomiting, or diarrhea. Unfortunately he does have a wound on the side of his fifth toe where I think this did rub on the shoe. 11-05-2022 upon evaluation today patient still still showing some signs of pressure at this point and there may have even been some fluid collection at this time. Fortunately I do not see any evidence of active infection locally nor systemically which is great news and overall I am extremely pleased with where we stand. No fevers, chills, nausea, vomiting, or diarrhea. 1/25; the patient's area on the dorsal aspect of the left fifth toe is healed. We have been using Iodoflex to the area on the left heel. The lateral wound is slightly down in length. 11-19-2022 upon evaluation today patient appears to be doing well currently in regard to his wound. He has been tolerating the dressing changes without complication. Fortunately there does not appear to be any signs of infection we been using Iodoflex up to this point. 11-26-2022 upon evaluation today patient appears to be doing poorly currently in regard to his wound. He has been tolerating the dressing changes with the Iodoflex without complication. With that being said he does not have any signs of active infection systemically though locally I do feel like there is some evidence of  infection here. 12-03-2022 upon evaluation today patient appears to be doing well currently in regard to his wound this is better with the Bactrim compared to last week still there was some significant slough and biofilm buildup which is going require sharp debridement at this point. Fortunately I do not see any signs of active infection YUVIN, DUBEL ([id number]) [id number] Page 5 of 13 locally nor systemically which is great  news. 12-11-2022 upon evaluation today patient actually appears to be doing significantly better at this time in regard to his heel. I am actually very pleased with where things stand currently. There is no signs of active infection locally or systemically at this point. 12-17-2022 upon evaluation today patient appears to be doing better as far as the overall appearance of his wound. I am actually very pleased in that regard. Fortunately there does not appear to be any signs of active infection locally nor systemically at this time. I do feel like that the patient is showing signs of excellent improvement in general. Nonetheless he needs something better for compression I think he should go ahead and see about getting compression socks. 3/7; tip of the left heel measurements are smaller he has been using Hydrofera Blue 01-07-2023 upon evaluation today patient appears to be doing well currently in regard to his wound. This has not been debrided since I last saw him and it definitely shows that definitely needs some debridement today. Fortunately I do not see any evidence of active infection locally nor systemically which is great news. 01-14-2023 upon evaluation today patient appears to be doing well currently although he does have some erythema and warmth to the heel I feel like he may have developed an infection yet again. This is something that is been ongoing issue we have been trying to manage his foot best we could. With that being said I do think he may need to go back on the Bactrim this has done well for him in the past. 01-21-2023 upon evaluation today patient appears to be doing better in regard to his heel. He is doing very well with the antibiotics and that is made a big difference for him based on what I am seeing I am very pleased with where we stand today. He is obviously doing extremely well which is great news. 01-28-2023 upon evaluation today patient appears to be doing well currently in  regard to his wound. This is actually showing signs of excellent improvement I am actually very pleased with where we stand and I do believe that he is making good progress here. Fortunately I do not see any evidence of active infection locally nor systemically which is great news. No fevers, chills, nausea, vomiting, or diarrhea. 02-04-2023 upon evaluation today patient's wound is actually showed signs of excellent improvement I am actually very pleased with where we stand. I do not see any signs of active infection locally nor systemically which is great news and in general I do believe that we are moving in the right direction here. No fevers, chills, nausea, vomiting, or diarrhea. 02-11-2023 upon evaluation patient's wound actually showing signs of excellent improvement I am actually very pleased with where we stand I think that his pain is better there is no signs of infection we will continue to use the gentamicin and overall we are making excellent progress. 02-18-2023 upon evaluation today patient appears to be doing well currently in regard to his wound. He has been tolerating the dressing changes without complication. Fortunately I  do not see any signs of active infection locally nor systemically which is great news. 02-25-2023 upon evaluation today patient's heel actually showed signs of excellent improvement. I am actually very pleased with where we stand today and I feel like that he is making really good progress. Fortunately I do not see any signs of active infection which is good news. No fevers, chills, nausea, vomiting, or diarrhea. 03-04-2023 upon evaluation today patient appears to be doing pretty well currently in regard to his heel. He has been tolerating the dressing changes without complication. Fortunately there does not appear to be any signs of active infection locally nor systemically which is great news. No fevers, chills, nausea, vomiting, or diarrhea. 03-11-2023 upon evaluation  today patient appears to be doing poorly in regard to his heel ulcer. He tells me he has been having to sleep on his back to having trouble breathing therefore I think he has been having some issues here as far as the heel is concerned it looks like he has a deep tissue injury yet again at this point. Fortunately I do not see any signs of infection which is good news but at the same time I do feel like that he is doing worse than where he was previous. I do not see any signs of active infection systemically which is great news. 03-18-2023 upon evaluation today patient appears to be doing well currently in fact the heel is significantly better compared to last week. I am extremely pleased with where we are seeing I am hopeful that he will continue to show signs of improvement going forward. 03-25-2023 upon evaluation today patient appears to be doing well currently in regard to his wounds. He is actually showing signs of improvement on the heel and I am very pleased in that regard. Fortunately I do not see any signs of active infection at this time which is great news. No fevers, chills, nausea, vomiting, or diarrhea. 04-05-2023 upon evaluation today patient's wound is actually showing signs of good improvement. Actually very pleased with where things stand today. Fortunately there does not appear to be any signs of active infection locally or systemically which is great news. 04-19-2023 upon evaluation today patient's wound actually looks the best it has in quite some time. He did have a wrap on for 2 weeks which I think is caused a little bit of irritation going to the forefoot everything else looks to be doing well the wrap did slide down a little bit as well. Fortunately I do not see any signs of infection however I do think that in the future if he has any issues with not being able to get scheduled to see me he should definitely make an appointment at least for a nurse visit he is aware and in agreement  with plan going forward. 04-26-2023 upon evaluation today patient's wound is actually significantly smaller this is great news he is making good progress in the past really 5 or 6 visits we have been seeing improvement week by week I am very pleased in that regard. 05-03-2023 upon evaluation today patient appears to be doing excellent in regard to the wound on his heel. This is actually showing signs of improvement he continues to make good progress towards complete closure which is great news. 7/23; wound continues to do nicely. Smaller with healthy epithelialization. This is actually a reopening we had already healed this area once and we discussed this this visit. 05-18-2023 upon evaluation today patient's wound actually  showing signs of improvement. Fortunately I do not see any signs of active infection locally or systemically which is great news. No fevers, chills, nausea, vomiting, or diarrhea. 05-24-2023 upon evaluation today patient's wound on the heel appears to be doing quite well. Fortunately there does not appear to be any signs of active infection at this time which is great news. Fortunately I think that he is making excellent headway with the current treatment regimen. Electronic Signature(s) Signed: 05/28/2023 12:03:54 PM By: [name] Derry PA-C Entered By: [name] Derry on 05/28/2023 12:03:54 Barrie Folk ([id number]) [id number] Page 6 of 13 -------------------------------------------------------------------------------- Physical Exam Details Patient Name: Date of Service: [name] 05/24/2023 3:45 PM Medical Record Number: [id number] Patient Account Number: [id number] Date of Birth/Sex: Treating RN: [birth year] (76 y.o. [name]) [name] Primary Care Provider: [name] Other Clinician: Betha Loa Referring Provider: Treating Provider/Extender: Hermine Messick Weeks in Treatment: 71 Constitutional Well-nourished and  well-hydrated in no acute distress. Respiratory normal breathing without difficulty. Psychiatric this patient is able to make decisions and demonstrates good insight into disease process. Alert and Oriented x 3. pleasant and cooperative. Notes Upon inspection patient's wound bed actually showed signs of good granulation and epithelization at this point. Fortunately I do not see any evidence of worsening overall I think that he is going require some sharp debridement postdebridement however this appears to be doing much better which is great news. Electronic Signature(s) Signed: 05/28/2023 12:04:10 PM By: [name] Derry PA-C Entered By: [name] Derry on 05/28/2023 12:04:10 -------------------------------------------------------------------------------- Physician Orders Details Patient Name: Date of Service: [name], [name] 05/24/2023 3:45 PM Medical Record Number: [id number] Patient Account Number: [id number] Date of Birth/Sex: Treating RN: 1946/04/23 (76 y.o. 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 CION, BENZING ([id number]) [id number] Page 7 of 13 Bathing/ Shower/ Hygiene May shower; gently cleanse wound with antibacterial soap, rinse and pat dry prior to dressing wounds Off-Loading Open toe surgical shoe Wound Treatment Wound #10 - Calcaneus Wound Laterality: Left Topical: Mupirocin Ointment 1 x Per Week/30  Days Discharge Instructions: Apply as directed by provider. Prim Dressing: Hydrofera Blue Ready Transfer Foam, 2.5x2.5 (in/in) (Dispense As Written) 1 x Per Week/30 Days ary Discharge Instructions: Apply Hydrofera Blue Ready to wound bed as directed Secondary Dressing: ABD Pad 5x9 (in/in) 1 x Per Week/30 Days Discharge Instructions: Cover with ABD pad Compression Wrap: Urgo K2 Lite, two layer compression system, regular 1 x Per Week/30 Days Electronic Signature(s) Signed: 05/24/2023 6:13:17 PM By: [name] Derry PA-C Signed: 05/26/2023 5:47:33 PM By: Betha Loa Entered By: Betha Loa on 05/24/2023 16:41:53 -------------------------------------------------------------------------------- Problem List Details Patient Name: Date of Service: [name], [name] 05/24/2023 3:45 PM Medical Record Number: [id number] Patient Account Number: [id number] Date of Birth/Sex: Treating RN: [name] 28, 1946 (76 y.o. [name] Primary Care Provider: [name] Other Clinician: Betha Loa Referring Provider: Treating Provider/Extender: [name] in Treatment: 48 Active Problems ICD-10 Encounter Code Description Active Date MDM Diagnosis E11.621 Type 2 diabetes mellitus with foot ulcer 06/19/2022 No  Yes 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sed 06/19/2022 No Yes E11.42 Type 2 diabetes mellitus with diabetic polyneuropathy 06/19/2022 No Yes Inactive Problems Resolved Problems Electronic Signature(s) Signed: 05/24/2023 3:36:49 PM By: Ricarda Frame, [name] Lunger ([id number]) [id number] Page 8 of 13 Entered By: [name] Derry on 05/24/2023 15:36:49 -------------------------------------------------------------------------------- Progress Note Details Patient Name: Date of Service: [name] [facility] 05/24/2023 3:45 PM Medical Record Number: [id number] Patient Account Number: [id number] Date of  Birth/Sex: Treating RN: 01-Nov-1945 (76 y.o. [name]) [name] Primary Care Provider: [name] Other Clinician: Betha Loa Referring Provider: Treating Provider/Extender: [name] in Treatment: 48 Subjective Chief Complaint Information obtained from Patient Left heel ulcer History of Present Illness (HPI) 09/24/2020 on evaluation today patient presents today for a heel ulcer that he tells me has been present for about 2 years. He has been seeing podiatry and they have been attempting to manage this including what sounds to be a total contact cast, Unna boot, and just standard dressings otherwise as well. Most recently has been using triple antibiotic ointment. With that being said unfortunately despite everything he really has not had any significant improvement. He tells me that he cannot even really remember exactly how this began but he presumed it may have rubbed on his shoes or something of that nature. 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 He tells me that this does seem to help. Fortunately there is no signs of active infection at this time. His most recent hemoglobin A1c was 8.1 though he knows this was this year he cannot tell me the exact time. His fluid pills currently to help with some of the lower extremity edema although he does obviously have signs of venous stasis/lymphedema. Currently there is no evidence of active infection. No fevers, chills, nausea, vomiting, or diarrhea. Patient has had fairly recent ABIs which were performed on 07/19/2020 and revealed that he has normal findings in both the ankle and toe locations bilaterally. His ABI on the right was 1.09 on  the left was 1.08 with a TBI on the right of 0.88 and on the left of 0.94. Triphasic flow was noted throughout. 10/08/2020 on evaluation today patient appears to be doing pretty well in regard to his left heel currently in fact this is doing a great job and seems to be healing quite nicely. Unfortunately on his right leg he had a pile of wood that actually fell on him injuring his right leg this is somewhat erythematous has me concerned little bit about cellulitis though there is not really a good area to culture at this point. 10/24/2020 upon evaluation today patient appears to be doing well with regard to his heel ulcer. He is showing signs of improvement which is great news. His right leg is completely healed. Overall I feel like he is doing excellent and there is no signs of infection. 11/07/2020 upon evaluation today patient appears to be doing well with regard to his heel ulcer. He tells me that last week when he was unable to come in his wife actually thought that the wound was very close to closing if not closed. Then it began to "  reopen again". I really feel like what may have happened as the collagen may have dried over the wound bed and that because that misunderstanding with thinking that the wound was healing. With that being said I did not see it last week I do not know that for certain. Either way I feel like he is doing great today I see no signs of infection at this point. 11/26/2020 upon inspection today patient appears to be doing decently well in regard to his heel ulcer. He has been tolerating the dressing changes without complication. Fortunately there is no sign of active infection at this time. No fevers, chills, nausea, vomiting, or diarrhea. 12/10/2020 upon evaluation today patient appears to be doing fairly well in regard to the wound on his heel as well as what appears to be a new wound of the left first metatarsal head plantar aspect. This seems to be an area that was callus that  has split as the patient tells me has been trying to walk on his toes more has probably where this came from. With that being said there does not appear to be signs of active infection which is great news. 12/17/2020 upon evaluation today patient actually appears to be making good progress currently. Fortunately there is no evidence of active infection at this time. Overall I feel like he is very close to complete closure. 12/26/2020 upon evaluation today patient appears to be doing excellent in regard to his wounds. In fact I am not certain that these are not even completely healed on initial inspection. Overall I am very pleased with where things stand today. Good news is that they are healed he is actually get ready to go out of town and that will be helpful as well as he will be a full part of the time. Readmission: 02/04/2021 upon evaluation today patient appears to be doing well at this point in regard to his left heel that I previously saw him for. Unfortunately he is having issues with his right lower extremity. He has significant wounds at this point he also has erythema noted there is definite signs of cellulitis which is unfortunate. With that being said I think we do need to address this sooner rather than later. The good news is he did have a nice trip to the beach. He tells me that he had no issues during that time. 4/27; patient on Bactrim. Culture showed methicillin sensitive staph aureus therefore the Bactrim should be effective. 2 small areas on the right leg are healed the area on the mid aspect of the tibia almost 100% covered in a very adherent necrotic debris. We have been using silver alginate 02/20/2021 upon evaluation today patient appears to be doing well with regard to his wound. He is showing signs of improvement which is great news overall very pleased with where things stand today. No fevers, chills, nausea, vomiting, or diarrhea. 02/27/2021 upon evaluation today patient  appears to be doing well with regard to his leg ulcer. He is tolerating the dressing changes and overall appears to be doing quite excellent. I am extremely pleased with where things stand and overall I think patient is making great progress. There is no sign of active infection at this time which is also great news. JADARRIUS, DUHART ([id number]) [id number] Page 9 of 13 03/06/2021 upon evaluation today patient appears to be doing excellent in regard to his wounds. In fact he appears to be completely healed today based on what I am seeing. This  is excellent news and overall I am extremely pleased with where he stands. Overall the patient is happy to hear this as well this has been quite sometime coming. Readmission: 04/01/2021 patient unfortunately returns for readmission today. He tells me that he has been wearing his compression socks on the right leg daily and he does not really know what is going on and why his legs are doing what they are doing. We did therefore go ahead and probe deeper into exactly what has been going on with him today. Subsequently the patient tells me that when he gets up and what we would call "first thing in the morning" are really 2 different things". He does not tend to sleep well so he tells me that he will often wake up at 3:00 in the morning. He will then potentially going into the living room to get in his chair where he may read a book for a little while and then potentially fall asleep back in his chair. He then subsequently wake up around 5:00 or so and then get up and in his words "putter around". This often will end with him proceeding at some point around 8 AM or 9 AM to putting on his compression socks. With that being said this means that anywhere from the 3:00 in the morning till roughly around 9:00 in the morning he has no compression on yet he is sitting in his recliner, walking around and up and about, and this is at least 5 to 6  hours of noncompressed time. That may be our issue here but I did not realize until we discussed this further today. Fortunately there does not appear to be any signs of active infection at this time which is great news. With that being said he has multiple wounds of the bilateral lower extremities. 04/10/2021 upon evaluation today patient appears to be doing well with regard to his legs for the most part. He does look like he had some injury where his wrap slid down nonetheless he did some "doctoring on them". I do feel like most of the areas honestly have cleared back up which is great news. There does not appear to be any signs of active infection at this time which is also great news. No fevers, chills, nausea, vomiting, or diarrhea. 04/18/2021 upon evaluation today patient appears to be doing well with regard to his wounds in fact he appears to be completely healed which is great news. Fortunately there does not appear to be any signs of active infection which is great news. No fevers, chills, nausea, vomiting, or diarrhea. READMISSION 07/28/2021 This is a now 77 year old man we have had in this clinic for quite a bit of this year. 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 He wears compression stockings. 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 He comes in an old running shoes although he says he wears better shoes at home. His history is that he noticed a blister in the right posterior heel. This open. He has been applying Neosporin to it currently the wound measures 2 x 1.4 cm. 100% slough covered. Not really offloading this in any rigorous way. Past medical history is essentially unchanged he has paroxysmal atrial fibrillation on Coumadin type 2 diabetes with a recent hemoglobin A1c of 7 on metformin. ABI in our clinic was 0.98 on the right 08/05/2021 upon evaluation today patient  appears to be  doing well with regard to his heel ulcer. Fortunately there does not appear to be any signs of active infection at this time. Overall I been very pleased with where things seem to be currently as far as the wound healing is concerned. Again this is first a lot of seeing him Dr. Leanord Hawking readmitted him last week. Nonetheless I think we are definitely making some progress here. 08/11/2021 upon evaluation today patient's wound on the heel actually showing signs of excellent improvement. I am actually very pleased with where things stand currently. No fevers, chills, nausea, vomiting, or diarrhea. There does not appear to be any need for sharp debridement today either which is also great news. 08/25/2021 upon evaluation today patient appears to be doing well with regard to his heel ulcer. This is actually looking significantly improved compared to last time I saw him. Fortunately there does not appear to be any evidence of active infection at this time. No fevers, chills, nausea, vomiting, or diarrhea. 09/01/2021 upon evaluation today patient appears to be doing decently well in regard to his wounds. Fortunately there does not appear to be any signs of active infection at this time which is great news and overall very pleased in that regard. I do not see any evidence of active infection systemically which is great news as well. No fevers, chills, nausea, vomiting, or diarrhea. I think that the heel is making excellent progress. 09/15/2021 upon evaluation today patient's heel unfortunately is significantly worse compared to what it was previous. I do believe that at this time he would benefit from switching back to something a little bit more offloading from the cushion shoe he has right now this is more of a heel protector what I really need is a Prevalon offloading boot which I think is good to do a lot better for him than just the small heel protector. 09/23/2021 upon evaluation today patient  appears to be doing a little better in regards to last week's visit. Overall I think that he is making good progress which is great news and there does not appear to be any evidence of active infection at this time. No fevers, chills, nausea, vomiting, or diarrhea. 09/30/2021 upon evaluation patient's heel is actually showing signs of good improvement which is great news. Fortunately there does not appear to be any evidence of active infection locally nor systemically at this point. No fevers, chills, nausea, vomiting, or diarrhea. 10/07/2021 upon evaluation today patient appears to be doing well currently in regard to his heel ulcer. He has been tolerating the dressing changes without complication. Fortunately I do not see any signs of active infection at this time which is great news. No fevers, chills, nausea, vomiting, or diarrhea. 12/27; wound is measuring smaller. We have been using silver alginate 10/28/2021 upon evaluation today patient appears to be doing well currently in regard to his heel ulcer. I am actually very pleased with where things stand and I think he is making excellent progress. Fortunately I do not see any signs of active infection locally nor systemically at this point which is great news. Nonetheless I do believe that the patient would benefit from a new offloading shoe and has been using it Velcro is no longer functioning properly and he tells me that he almost tripped and fell because of that today. Obviously I do not want him following that would be very bad. 11/11/2021 upon evaluation today patient appears to be doing excellent in regard to his wound. In fact this  appears to be completely healed based on what I see currently. I do not see any signs of anything open or draining and I did double check just by clearing away some of the callus around the edges of the wound to ensure that there was nothing still open or hiding underneath. Once this was cleared away it appeared  that the patient was doing significantly better at this time which is great news and there was nothing actually open. Readmission: 06-19-2022 upon evaluation today patient appears to be doing well currently in regard to his heel ulcer all things considered. He unfortunately is having a bit of breakdown in general as far as the heel is concerned not nearly as bad as what we noted last time he was here in the clinic. The good news is I do think that this should hopefully heal much more rapidly than what we noted last time. His past medical history has not changed he is on Coumadin. 07-03-2022 upon evaluation today patient appears to be doing better in regard to his heel. We will start to see some improvement here which is good news. Fortunately I do not see any evidence of infection locally or systemically at this time which is great news. No fevers, chills, nausea, vomiting, or diarrhea. 07-10-2022 upon evaluation today patient appears to be doing well currently in regard to his wound from the callus standpoint around the edges. Unfortunately from an actual wound bed standpoint he has some deep tissue injury noted at this point. Again I am not sure exactly what is going on that is causing this pressure to the region but he is definitely gotten pressure that is occurring and causing this to not heal as effectively as what we would like to see. I discussed with him today that he may need to at night when he sleeping use a pillow up underneath his calf in order to prevent the heel from touching the bed at all. I also MICH, AMEDEE ([id number]) [id number] Page 10 of 13 think that it may be beneficial for him to monitor throughout his day and make sure there is no other time when he is getting the pressure to the heel. 07-23-2022 upon evaluation today patient appears to be doing poorly currently in regard to his heel ulcer. He has been tolerating the dressing changes  without complication. Unfortunately I feel like he may be infected based on what I am seeing. 07-28-2022 I did review patient's culture results which showed positive for Proteus as well as Staphylococcus both of which would be treated with the Bactrim DS that I gave him at the last visit. This is good news and hopefully means that we should be able to apply the cast today as long as the wound itself does not appear to be doing poorly. Patient's wound bed actually showed signs of doing quite well and I am very pleased with where things stand and I do not see any signs of worsening overall which is great news. 08-04-2022 upon evaluation today patient appears to be doing well currently in regard to his heel ulcer. I am actually very pleased with where things stand and I do think this is looking significantly better. With regard to his right shin that is also showing signs of improvement which is great news. 08-10-2022 upon evaluation today patient's wound on the heel actually appears to be doing significantly better. In regard to the right anterior shin this is showing signs of healing it might even be completely  healed but I still get a monitor I cannot get anything to fill away but also could not see where there was anything actually draining at this point. I am tending to think healed but I want a monitor 1 more week before I call it for sure. 08-17-2022 upon evaluation today patient appears to be doing well currently in regard to his heel. Fortunately he is tolerating the dressing changes without complication. I do not see any signs of infection and overall I think he is making good progress here. We did get approval for the Apligraf but I think he had a $295 co-pay that would have to be paid per application. With that being said as good as he is doing right now I do not think that is necessary but is still an option depending on how things progress if we need to speed this up quite a bit. 08/24/2022;  this patient has a wound on his left heel in the setting of type 2 diabetes. We have been using Prisma. He has a heel offloading boot 08-31-2022 upon evaluation today patient appears to be doing poorly in regard to his heel ulcer which is still showing signs of bruising I am just not as happy as I was when I saw him 2 weeks ago with this. He saw Dr. Leanord Hawking last week. Also did not look good at that point apparently. Nonetheless I think that the patient is still continuing to have some counterpressure getting to the wound bed unfortunately. 09-07-2022 upon evaluation today patient appears to be doing well currently in regard to his heel ulcer. He has been tolerating the dressing changes without complication. Fortunately there does not appear to be any signs of active infection locally or systemically at this time. Fortunately I do not see any evidence of active infection at this time. 09-15-2022 upon evaluation today patient appears to be doing well currently in regard to his legs which in fact appear to be completely healed this is great news. With that being said his heel does have some erythema and warmth as well as drainage I am concerned about cellulitis at this location. 09-21-2022 upon evaluation today patient's wound is actually showing signs of being a little bit smaller but still we are not doing nearly as well as what I would like to see. Fortunately I do not see any signs of infection at this time which is great news and overall I am extremely pleased in that regard. With that being said I do think that he needs to continue to elevate his leg although the edema is under great control with a compression wrap I think switching to a different dressing topically may be beneficial. My suggestion is to switch him over to a Iodoflex dressing. 09-28-2022 upon evaluation today patient appears to be doing well with regard to his heel ulcer. This is actually showing signs of improvement which is  great news and overall I am extremely pleased with where we stand today. 10-06-2022 upon evaluation today patient actually appears to be making excellent progress at this point. Fortunately there does not appear to be any signs of active infection locally nor systemically which is great news and overall I am extremely pleased with where we stand. I do feel like he is actually making some pretty good progress here as we switch to the wrap along with the Iodoflex. 12/26; patient has a wound on the tip of his left heel. This is not a weightbearing surface. He is using a  heel offloading boot and carefully offloading this at other times during the day. He is using Iodoflex and Zetuvitunder 3 layer compression 10-22-2022 upon evaluation today patient appears to be doing well currently in regard to his wound. He has been tolerating the dressing changes without complication. Fortunately there does not appear to be any signs of active infection locally nor systemically at this time. No fevers, chills, nausea, vomiting, or diarrhea. 08-30-2023 upon evaluation today patient appears to be doing well currently in regard to his heel ulcer which is showing signs of good improvement. Fortunately I do not see any evidence of infection locally nor systemically which is great news and overall I am extremely pleased with where we stand. No fevers, chills, nausea, vomiting, or diarrhea. Unfortunately he does have a wound on the side of his fifth toe where I think this did rub on the shoe. 11-05-2022 upon evaluation today patient still still showing some signs of pressure at this point and there may have even been some fluid collection at this time. Fortunately I do not see any evidence of active infection locally nor systemically which is great news and overall I am extremely pleased with where we stand. No fevers, chills, nausea, vomiting, or diarrhea. 1/25; the patient's area on the dorsal aspect of the left fifth toe is  healed. We have been using Iodoflex to the area on the left heel. The lateral wound is slightly down in length. 11-19-2022 upon evaluation today patient appears to be doing well currently in regard to his wound. He has been tolerating the dressing changes without complication. Fortunately there does not appear to be any signs of infection we been using Iodoflex up to this point. 11-26-2022 upon evaluation today patient appears to be doing poorly currently in regard to his wound. He has been tolerating the dressing changes with the Iodoflex without complication. With that being said he does not have any signs of active infection systemically though locally I do feel like there is some evidence of infection here. 12-03-2022 upon evaluation today patient appears to be doing well currently in regard to his wound this is better with the Bactrim compared to last week still there was some significant slough and biofilm buildup which is going require sharp debridement at this point. Fortunately I do not see any signs of active infection locally nor systemically which is great news. 12-11-2022 upon evaluation today patient actually appears to be doing significantly better at this time in regard to his heel. I am actually very pleased with where things stand currently. There is no signs of active infection locally or systemically at this point. 12-17-2022 upon evaluation today patient appears to be doing better as far as the overall appearance of his wound. I am actually very pleased in that regard. Fortunately there does not appear to be any signs of active infection locally nor systemically at this time. I do feel like that the patient is showing signs of excellent improvement in general. Nonetheless he needs something better for compression I think he should go ahead and see about getting compression socks. 3/7; tip of the left heel measurements are smaller he has been using Hydrofera Blue 01-07-2023 upon  evaluation today patient appears to be doing well currently in regard to his wound. This has not been debrided since I last saw him and it definitely shows that definitely needs some debridement today. Fortunately I do not see any evidence of active infection locally nor systemically which is great news. 01-14-2023  upon evaluation today patient appears to be doing well currently although he does have some erythema and warmth to the heel I feel like he may have developed an infection yet again. This is something that is been ongoing issue we have been trying to manage his foot best we could. With that being JERRIC, VORWERK ([id number]) [id number] Page 11 of 13 said I do think he may need to go back on the Bactrim this has done well for him in the past. 01-21-2023 upon evaluation today patient appears to be doing better in regard to his heel. He is doing very well with the antibiotics and that is made a big difference for him based on what I am seeing I am very pleased with where we stand today. He is obviously doing extremely well which is great news. 01-28-2023 upon evaluation today patient appears to be doing well currently in regard to his wound. This is actually showing signs of excellent improvement I am actually very pleased with where we stand and I do believe that he is making good progress here. Fortunately I do not see any evidence of active infection locally nor systemically which is great news. No fevers, chills, nausea, vomiting, or diarrhea. 02-04-2023 upon evaluation today patient's wound is actually showed signs of excellent improvement I am actually very pleased with where we stand. I do not see any signs of active infection locally nor systemically which is great news and in general I do believe that we are moving in the right direction here. No fevers, chills, nausea, vomiting, or diarrhea. 02-11-2023 upon evaluation patient's wound actually showing signs of  excellent improvement I am actually very pleased with where we stand I think that his pain is better there is no signs of infection we will continue to use the gentamicin and overall we are making excellent progress. 02-18-2023 upon evaluation today patient appears to be doing well currently in regard to his wound. He has been tolerating the dressing changes without complication. Fortunately I do not see any signs of active infection locally nor systemically which is great news. 02-25-2023 upon evaluation today patient's heel actually showed signs of excellent improvement. I am actually very pleased with where we stand today and I feel like that he is making really good progress. Fortunately I do not see any signs of active infection which is good news. No fevers, chills, nausea, vomiting, or diarrhea. 03-04-2023 upon evaluation today patient appears to be doing pretty well currently in regard to his heel. He has been tolerating the dressing changes without complication. Fortunately there does not appear to be any signs of active infection locally nor systemically which is great news. No fevers, chills, nausea, vomiting, or diarrhea. 03-11-2023 upon evaluation today patient appears to be doing poorly in regard to his heel ulcer. He tells me he has been having to sleep on his back to having trouble breathing therefore I think he has been having some issues here as far as the heel is concerned it looks like he has a deep tissue injury yet again at this point. Fortunately I do not see any signs of infection which is good news but at the same time I do feel like that he is doing worse than where he was previous. I do not see any signs of active infection systemically which is great news. 03-18-2023 upon evaluation today patient appears to be doing well currently in fact the heel is significantly better compared to last week. I am extremely  pleased with where we are seeing I am hopeful that he will continue to  show signs of improvement going forward. 03-25-2023 upon evaluation today patient appears to be doing well currently in regard to his wounds. He is actually showing signs of improvement on the heel and I am very pleased in that regard. Fortunately I do not see any signs of active infection at this time which is great news. No fevers, chills, nausea, vomiting, or diarrhea. 04-05-2023 upon evaluation today patient's wound is actually showing signs of good improvement. Actually very pleased with where things stand today. Fortunately there does not appear to be any signs of active infection locally or systemically which is great news. 04-19-2023 upon evaluation today patient's wound actually looks the best it has in quite some time. He did have a wrap on for 2 weeks which I think is caused a little bit of irritation going to the forefoot everything else looks to be doing well the wrap did slide down a little bit as well. Fortunately I do not see any signs of infection however I do think that in the future if he has any issues with not being able to get scheduled to see me he should definitely make an appointment at least for a nurse visit he is aware and in agreement with plan going forward. 04-26-2023 upon evaluation today patient's wound is actually significantly smaller this is great news he is making good progress in the past really 5 or 6 visits we have been seeing improvement week by week I am very pleased in that regard. 05-03-2023 upon evaluation today patient appears to be doing excellent in regard to the wound on his heel. This is actually showing signs of improvement he continues to make good progress towards complete closure which is great news. 7/23; wound continues to do nicely. Smaller with healthy epithelialization. This is actually a reopening we had already healed this area once and we discussed this this visit. 05-18-2023 upon evaluation today patient's wound actually showing signs of  improvement. Fortunately I do not see any signs of active infection locally or systemically which is great news. No fevers, chills, nausea, vomiting, or diarrhea. 05-24-2023 upon evaluation today patient's wound on the heel appears to be doing quite well. Fortunately there does not appear to be any signs of active infection at this time which is great news. Fortunately I think that he is making excellent headway with the current treatment regimen. Objective Constitutional Well-nourished and well-hydrated in no acute distress. Vitals Time Taken: 3:56 PM, Height: 70 in, Weight: 265 lbs, BMI: 38, Temperature: 98.4 F, Pulse: 71 bpm, Respiratory Rate: 18 breaths/min, Blood Pressure: 137/66 mmHg. Respiratory normal breathing without difficulty. Psychiatric this patient is able to make decisions and demonstrates good insight into disease process. Alert and Oriented x 3. pleasant and cooperative. General Notes: Upon inspection patient's wound bed actually showed signs of good granulation and epithelization at this point. Fortunately I do not see any evidence of worsening overall I think that he is going require some sharp debridement postdebridement however this appears to be doing much better which is great news. Integumentary (Hair, Skin) ORIEL, KLAUS ([id number]) [id number] Page 12 of 13 Wound #10 status is Open. Original cause of wound was Gradually Appeared. The date acquired was: 06/05/2022. The wound has been in treatment 48 weeks. The wound is located on the Left Calcaneus. The wound measures 1.5cm length x 0.6cm width x 0.1cm depth; 0.707cm^2 area and 0.071cm^3 volume. There is Fat  Layer (Subcutaneous Tissue) exposed. There is a medium amount of serosanguineous drainage noted. The wound margin is flat and intact. There is small (1-33%) pink granulation within the wound bed. There is a small (1-33%) amount of necrotic tissue within the wound bed including Adherent  Slough. 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 There was a Excisional Skin/Subcutaneous Tissue Debridement with a total area of 0.71 sq cm performed by [name] Derry, PA-C. With the following instrument(s): Curette to remove Viable and Non-Viable tissue/material. Material removed includes Callus, Subcutaneous Tissue, and Slough. A time out was conducted at 16:37, prior to the start of the procedure. A Minimum amount of bleeding was controlled with Pressure. The procedure was tolerated well. Post Debridement Measurements: 1.5cm length x 0.6cm width x 0.1cm depth; 0.071cm^3 volume. Character of Wound/Ulcer Post Debridement is improved. Severity of Tissue Post Debridement is: Fat layer exposed. 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 There was a Double Layer Compression Therapy Procedure with a pre-treatment ABI of 1 by Betha Loa. Post procedure Diagnosis Wound #10: Same as Pre-Procedure Plan Follow-up Appointments: Wound #10 Left Calcaneus: Return Appointment in 1 week. Bathing/ Shower/ Hygiene: May shower; gently cleanse wound with antibacterial soap, rinse and pat dry prior to dressing wounds Off-Loading: Open toe surgical shoe WOUND #10: - Calcaneus Wound Laterality: Left Topical: Mupirocin Ointment 1 x Per Week/30 Days Discharge Instructions: Apply as directed by provider. Prim Dressing: Hydrofera Blue Ready Transfer Foam, 2.5x2.5 (in/in) (Dispense As Written) 1 x Per Week/30  Days ary Discharge Instructions: Apply Hydrofera Blue Ready to wound bed as directed Secondary Dressing: ABD Pad 5x9 (in/in) 1 x Per Week/30 Days Discharge Instructions: Cover with ABD pad Com pression Wrap: Urgo K2 Lite, two layer compression system, regular 1 x Per Week/30 Days 1. I am going to recommend that we have the patient continue to monitor for any signs of infection or worsening. Based on what I see I do think there were making excellent headway towards complete closure. 2. I am going to suggest as well that he continue with the [facility] followed by ABD pad and then the Urgo K2 lite compression wrap. We will see patient back for reevaluation in 1 week here in the clinic. If anything worsens or changes patient will contact our office for additional recommendations. Electronic Signature(s) Signed: 05/28/2023 12:04:35 PM By: [name] Derry PA-C Entered By: [name] Derry on 05/28/2023 12:04:35 Barrie Folk ([id number]) [id number] Page 13 of 13 -------------------------------------------------------------------------------- SuperBill Details Patient Name: Date of Service: [name] 05/24/2023 Medical Record Number: [id number] Patient Account Number: [id number] Date of Birth/Sex: Treating RN: 09/19/1946 (76 y.o. [name]) [name] Primary Care Provider: [name] Other Clinician: Betha Loa Referring Provider: Treating Provider/Extender: Hermine Messick Weeks in Treatment: 48 Diagnosis Coding ICD-10 Codes Code Description E11.621 Type 2 diabetes mellitus with foot ulcer 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sed E11.42 Type 2 diabetes mellitus with diabetic polyneuropathy Facility Procedures : CPT4 Code: [id number] Description: 11042 - DEB SUBQ TISSUE 20 SQ CM/< ICD-10  Diagnosis Description L97.522 Non-pressure chronic ulcer of other part of left foot with fat layer  exposed Modifier: Quantity: 1 Physician Procedures : CPT4 Code Description Modifier [id number] 11042 - WC PHYS SUBQ TISS 20 SQ CM ICD-10 Diagnosis Description 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sed Quantity: 1 Electronic Signature(s) Signed: 05/24/2023 5:47:46 PM By: [name] Derry PA-C Entered By: [name] Derry on 05/24/2023 17:47:45

## 2023-06-01 ENCOUNTER — Encounter: Payer: Medicare HMO | Admitting: Physician Assistant

## 2023-06-01 DIAGNOSIS — E11621 Type 2 diabetes mellitus with foot ulcer: Secondary | ICD-10-CM | POA: Diagnosis not present

## 2023-06-01 NOTE — Progress Notes (Addendum)
TERIK, DACANAY ([id number]) [id number] Page 1 of 13 Visit Report for 06/01/2023 Chief Complaint Document Details Patient Name: Date of Service: [name], GEO [facility] J. 06/01/2023 1:45 PM Medical Record Number: [id number] Patient Account Number: [id number] Date of Birth/Sex: Treating RN: 1946/08/25 (77 y.o. [name] Primary Care Provider: [name] Other Clinician: Betha Loa Referring Provider: Treating Provider/Extender: Hermine Messick Weeks in Treatment: 91 Information Obtained from: Patient Chief Complaint Left heel ulcer Electronic Signature(s) Signed: 06/01/2023 1:38:56 PM By: [name] Derry PA-C Entered By: [name] Derry on 06/01/2023 13:38:55 -------------------------------------------------------------------------------- Debridement Details Patient Name: Date of Service: [name], [name] 06/01/2023 1:45 PM Medical Record Number: [id number] Patient Account Number: [id number] Date of Birth/Sex: Treating RN: 11/04/1945 (77 y.o. [name] Primary Care Provider: [name] Other Clinician: Referring Provider: Treating Provider/Extender: [name] in Treatment: 49 Debridement Performed for Assessment: Wound #10 Left Calcaneus Performed By: Physician [name] Derry, PA-C Debridement Type: Debridement Severity of Tissue Pre Debridement: Fat layer exposed Level of Consciousness (Pre-procedure): Awake and Alert Pre-procedure Verification/Time Out Yes - 14:23 Taken: Percent of Wound Bed Debrided: 100% T Area Debrided (cm): otal 0.85 Tissue and other material debrided: Viable, Non-Viable, Callus, Slough, Subcutaneous, Slough Level: Skin/Subcutaneous Tissue Debridement Description: Excisional Instrument: Curette Bleeding: Moderate Hemostasis Achieved: Pressure Response to Treatment: Procedure was tolerated well Level of Consciousness (Post- Awake and Alert procedure): WOODWARD, TEPLY  ([id number]) [id number] Page 2 of 13 Post Debridement Measurements of Total Wound Length: (cm) 1.2 Width: (cm) 0.9 Depth: (cm) 0.1 Volume: (cm) 0.085 Character of Wound/Ulcer Post Debridement: Stable Severity of Tissue Post Debridement: Fat layer exposed Post Procedure Diagnosis Same as Pre-procedure Electronic Signature(s) Signed: 06/01/2023 4:30:30 PM By: [name] Derry PA-C Signed: 06/01/2023 4:40:48 PM By: Angelina Pih Entered By: Angelina Pih on 06/01/2023 14:24:14 -------------------------------------------------------------------------------- HPI Details Patient Name: Date of Service: [name], [name] 06/01/2023 1:45 PM Medical Record Number: [id number] Patient Account Number: [id number] Date of Birth/Sex: Treating RN: October 16, 1946 (77 y.o. [name] Primary Care Provider: [name] Other Clinician: Betha Loa Referring Provider: Treating Provider/Extender: Hermine Messick Weeks in Treatment: 62 History of Present Illness HPI Description: 09/24/2020 on evaluation today patient presents today for a heel ulcer that he tells me has been present for about 2 years. He has been seeing podiatry and they have been attempting to manage this including what sounds to be a total contact cast, Unna boot, and just standard dressings otherwise as well. Most recently has been using triple antibiotic ointment. With that being said unfortunately despite everything he really has not had any significant improvement. He tells me that he cannot even really remember exactly how this began but he presumed it may have rubbed on his shoes or something of that nature. 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 He also does have chronic pain in the way of neuropathy which he takes  medications for including Cymbalta and methadone. He tells me that this does seem to help. Fortunately there is no signs of active infection at this time. His most recent hemoglobin A1c was 8.1 though he knows this was this year he cannot tell me the exact time. His fluid pills currently to help with some of the lower extremity edema although he does obviously have signs of venous stasis/lymphedema. Currently there  is no evidence of active infection. No fevers, chills, nausea, vomiting, or diarrhea. Patient has had fairly recent ABIs which were performed on 07/19/2020 and revealed that he has normal findings in both the ankle and toe locations bilaterally. His ABI on the right was 1.09 on the left was 1.08 with a TBI on the right of 0.88 and on the left of 0.94. Triphasic flow was noted throughout. 10/08/2020 on evaluation today patient appears to be doing pretty well in regard to his left heel currently in fact this is doing a great job and seems to be healing quite nicely. Unfortunately on his right leg he had a pile of wood that actually fell on him injuring his right leg this is somewhat erythematous has me concerned little bit about cellulitis though there is not really a good area to culture at this point. 10/24/2020 upon evaluation today patient appears to be doing well with regard to his heel ulcer. He is showing signs of improvement which is great news. His right leg is completely healed. Overall I feel like he is doing excellent and there is no signs of infection. 11/07/2020 upon evaluation today patient appears to be doing well with regard to his heel ulcer. He tells me that last week when he was unable to come in his wife actually thought that the wound was very close to closing if not closed. Then it began to "reopen again". I really feel like what may have happened as the collagen may have dried over the wound bed and that because that misunderstanding with thinking that the wound was  healing. With that being said I did not see it last week I do not know that for certain. Either way I feel like he is doing great today I see no signs of infection at this point. 11/26/2020 upon inspection today patient appears to be doing decently well in regard to his heel ulcer. He has been tolerating the dressing changes without complication. Fortunately there is no sign of active infection at this time. No fevers, chills, nausea, vomiting, or diarrhea. 12/10/2020 upon evaluation today patient appears to be doing fairly well in regard to the wound on his heel as well as what appears to be a new wound of the left first metatarsal head plantar aspect. This seems to be an area that was callus that has split as the patient tells me has been trying to walk on his toes more has probably where this came from. With that being said there does not appear to be signs of active infection which is great news. 12/17/2020 upon evaluation today patient actually appears to be making good progress currently. Fortunately there is no evidence of active infection at this time. Overall I feel like he is very close to complete closure. 12/26/2020 upon evaluation today patient appears to be doing excellent in regard to his wounds. In fact I am not certain that these are not even completely healed on initial inspection. Overall I am very pleased with where things stand today. Good news is that they are healed he is actually get ready to go out of town and that will be helpful as well as he will be a full part of the time. ISAIHA, DRECHSLER ([id number]) [id number] Page 3 of 13 Readmission: 02/04/2021 upon evaluation today patient appears to be doing well at this point in regard to his left heel that I previously saw him for. Unfortunately he is having issues with his right lower extremity. He has  significant wounds at this point he also has erythema noted there is definite signs of cellulitis which is  unfortunate. With that being said I think we do need to address this sooner rather than later. The good news is he did have a nice trip to the beach. He tells me that he had no issues during that time. 4/27; patient on Bactrim. Culture showed methicillin sensitive staph aureus therefore the Bactrim should be effective. 2 small areas on the right leg are healed the area on the mid aspect of the tibia almost 100% covered in a very adherent necrotic debris. We have been using silver alginate 02/20/2021 upon evaluation today patient appears to be doing well with regard to his wound. He is showing signs of improvement which is great news overall very pleased with where things stand today. No fevers, chills, nausea, vomiting, or diarrhea. 02/27/2021 upon evaluation today patient appears to be doing well with regard to his leg ulcer. He is tolerating the dressing changes and overall appears to be doing quite excellent. I am extremely pleased with where things stand and overall I think patient is making great progress. There is no sign of active infection at this time which is also great news. 03/06/2021 upon evaluation today patient appears to be doing excellent in regard to his wounds. In fact he appears to be completely healed today based on what I am seeing. This is excellent news and overall I am extremely pleased with where he stands. Overall the patient is happy to hear this as well this has been quite sometime coming. Readmission: 04/01/2021 patient unfortunately returns for readmission today. He tells me that he has been wearing his compression socks on the right leg daily and he does not really know what is going on and why his legs are doing what they are doing. We did therefore go ahead and probe deeper into exactly what has been going on with him today. Subsequently the patient tells me that when he gets up and what we would call "first thing in the morning" are really 2 different things". He does  not tend to sleep well so he tells me that he will often wake up at 3:00 in the morning. He will then potentially going into the living room to get in his chair where he may read a book for a little while and then potentially fall asleep back in his chair. He then subsequently wake up around 5:00 or so and then get up and in his words "putter around". This often will end with him proceeding at some point around 8 AM or 9 AM to putting on his compression socks. With that being said this means that anywhere from the 3:00 in the morning till roughly around 9:00 in the morning he has no compression on yet he is sitting in his recliner, walking around and up and about, and this is at least 5 to 6 hours of noncompressed time. That may be our issue here but I did not realize until we discussed this further today. Fortunately there does not appear to be any signs of active infection at this time which is great news. With that being said he has multiple wounds of the bilateral lower extremities. 04/10/2021 upon evaluation today patient appears to be doing well with regard to his legs for the most part. He does look like he had some injury where his wrap slid down nonetheless he did some "doctoring on them". I do feel like most of  the areas honestly have cleared back up which is great news. There does not appear to be any signs of active infection at this time which is also great news. No fevers, chills, nausea, vomiting, or diarrhea. 04/18/2021 upon evaluation today patient appears to be doing well with regard to his wounds in fact he appears to be completely healed which is great news. Fortunately there does not appear to be any signs of active infection which is great news. No fevers, chills, nausea, vomiting, or diarrhea. READMISSION 07/28/2021 This is a now 77 year old man we have had in this clinic for quite a bit of this year. He has been in here with bilateral lower extremity leg wounds probably secondary  to chronic venous insufficiency. He wears compression stockings. 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 He comes in an old running shoes although he says he wears better shoes at home. His history is that he noticed a blister in the right posterior heel. This open. He has been applying Neosporin to it currently the wound measures 2 x 1.4 cm. 100% slough covered. Not really offloading this in any rigorous way. Past medical history is essentially unchanged he has paroxysmal atrial fibrillation on Coumadin type 2 diabetes with a recent hemoglobin A1c of 7 on metformin. ABI in our clinic was 0.98 on the right 08/05/2021 upon evaluation today patient appears to be doing well with regard to his heel ulcer. Fortunately there does not appear to be any signs of active infection at this time. Overall I been very pleased with where things seem to be currently as far as the wound healing is concerned. Again this is first a lot of seeing him Dr. Leanord Hawking readmitted him last week. Nonetheless I think we are definitely making some progress here. 08/11/2021 upon evaluation today patient's wound on the heel actually showing signs of excellent improvement. I am actually very pleased with where things stand currently. No fevers, chills, nausea, vomiting, or diarrhea. There does not appear to be any need for sharp debridement today either which is also great news. 08/25/2021 upon evaluation today patient appears to be doing well with regard to his heel ulcer. This is actually looking significantly improved compared to last time I saw him. Fortunately there does not appear to be any evidence of active infection at this time. No fevers, chills, nausea, vomiting, or diarrhea. 09/01/2021 upon evaluation today patient appears to be doing decently well in regard to his wounds. Fortunately there does not appear to be any signs of active infection at this time which is great news and  overall very pleased in that regard. I do not see any evidence of active infection systemically which is great news as well. No fevers, chills, nausea, vomiting, or diarrhea. I think that the heel is making excellent progress. 09/15/2021 upon evaluation today patient's heel unfortunately is significantly worse compared to what it was previous. I do believe that at this time he would benefit from switching back to something a little bit more offloading from the cushion shoe he has right now this is more of a heel protector what I really need is a Prevalon offloading boot which I think is good to do a lot better for him than just the small heel protector. 09/23/2021 upon evaluation today patient appears to be doing a little better in regards to last week's visit. Overall I think that he is making good progress which is great news and there does not appear  to be any evidence of active infection at this time. No fevers, chills, nausea, vomiting, or diarrhea. 09/30/2021 upon evaluation patient's heel is actually showing signs of good improvement which is great news. Fortunately there does not appear to be any evidence of active infection locally nor systemically at this point. No fevers, chills, nausea, vomiting, or diarrhea. 10/07/2021 upon evaluation today patient appears to be doing well currently in regard to his heel ulcer. He has been tolerating the dressing changes without complication. Fortunately I do not see any signs of active infection at this time which is great news. No fevers, chills, nausea, vomiting, or diarrhea. 12/27; wound is measuring smaller. We have been using silver alginate 10/28/2021 upon evaluation today patient appears to be doing well currently in regard to his heel ulcer. I am actually very pleased with where things stand and I think he is making excellent progress. Fortunately I do not see any signs of active infection locally nor systemically at this point which is great  news. Nonetheless I do believe that the patient would benefit from a new offloading shoe and has been using it Velcro is no longer functioning properly and he tells me that he almost tripped and fell because of that today. Obviously I do not want him following that would be very bad. 11/11/2021 upon evaluation today patient appears to be doing excellent in regard to his wound. In fact this appears to be completely healed based on what I see currently. I do not see any signs of anything open or draining and I did double check just by clearing away some of the callus around the edges of the wound ROMAIN, KAISER ([id number]) [id number] Page 4 of 13 to ensure that there was nothing still open or hiding underneath. Once this was cleared away it appeared that the patient was doing significantly better at this time which is great news and there was nothing actually open. Readmission: 06-19-2022 upon evaluation today patient appears to be doing well currently in regard to his heel ulcer all things considered. He unfortunately is having a bit of breakdown in general as far as the heel is concerned not nearly as bad as what we noted last time he was here in the clinic. The good news is I do think that this should hopefully heal much more rapidly than what we noted last time. His past medical history has not changed he is on Coumadin. 07-03-2022 upon evaluation today patient appears to be doing better in regard to his heel. We will start to see some improvement here which is good news. Fortunately I do not see any evidence of infection locally or systemically at this time which is great news. No fevers, chills, nausea, vomiting, or diarrhea. 07-10-2022 upon evaluation today patient appears to be doing well currently in regard to his wound from the callus standpoint around the edges. Unfortunately from an actual wound bed standpoint he has some deep tissue injury noted at this point.  Again I am not sure exactly what is going on that is causing this pressure to the region but he is definitely gotten pressure that is occurring and causing this to not heal as effectively as what we would like to see. I discussed with him today that he may need to at night when he sleeping use a pillow up underneath his calf in order to prevent the heel from touching the bed at all. I also think that it may be beneficial for him to monitor throughout  his day and make sure there is no other time when he is getting the pressure to the heel. 07-23-2022 upon evaluation today patient appears to be doing poorly currently in regard to his heel ulcer. He has been tolerating the dressing changes without complication. Unfortunately I feel like he may be infected based on what I am seeing. 07-28-2022 I did review patient's culture results which showed positive for Proteus as well as Staphylococcus both of which would be treated with the Bactrim DS that I gave him at the last visit. This is good news and hopefully means that we should be able to apply the cast today as long as the wound itself does not appear to be doing poorly. Patient's wound bed actually showed signs of doing quite well and I am very pleased with where things stand and I do not see any signs of worsening overall which is great news. 08-04-2022 upon evaluation today patient appears to be doing well currently in regard to his heel ulcer. I am actually very pleased with where things stand and I do think this is looking significantly better. With regard to his right shin that is also showing signs of improvement which is great news. 08-10-2022 upon evaluation today patient's wound on the heel actually appears to be doing significantly better. In regard to the right anterior shin this is showing signs of healing it might even be completely healed but I still get a monitor I cannot get anything to fill away but also could not see where there  was anything actually draining at this point. I am tending to think healed but I want a monitor 1 more week before I call it for sure. 08-17-2022 upon evaluation today patient appears to be doing well currently in regard to his heel. Fortunately he is tolerating the dressing changes without complication. I do not see any signs of infection and overall I think he is making good progress here. We did get approval for the Apligraf but I think he had a $295 co-pay that would have to be paid per application. With that being said as good as he is doing right now I do not think that is necessary but is still an option depending on how things progress if we need to speed this up quite a bit. 08/24/2022; this patient has a wound on his left heel in the setting of type 2 diabetes. We have been using Prisma. He has a heel offloading boot 08-31-2022 upon evaluation today patient appears to be doing poorly in regard to his heel ulcer which is still showing signs of bruising I am just not as happy as I was when I saw him 2 weeks ago with this. He saw Dr. Leanord Hawking last week. Also did not look good at that point apparently. Nonetheless I think that the patient is still continuing to have some counterpressure getting to the wound bed unfortunately. 09-07-2022 upon evaluation today patient appears to be doing well currently in regard to his heel ulcer. He has been tolerating the dressing changes without complication. Fortunately there does not appear to be any signs of active infection locally or systemically at this time. Fortunately I do not see any evidence of active infection at this time. 09-15-2022 upon evaluation today patient appears to be doing well currently in regard to his legs which in fact appear to be completely healed this is great news. With that being said his heel does have some erythema and warmth as well as drainage  I am concerned about cellulitis at this location. 09-21-2022 upon evaluation today  patient's wound is actually showing signs of being a little bit smaller but still we are not doing nearly as well as what I would like to see. Fortunately I do not see any signs of infection at this time which is great news and overall I am extremely pleased in that regard. With that being said I do think that he needs to continue to elevate his leg although the edema is under great control with a compression wrap I think switching to a different dressing topically may be beneficial. My suggestion is to switch him over to a Iodoflex dressing. 09-28-2022 upon evaluation today patient appears to be doing well with regard to his heel ulcer. This is actually showing signs of improvement which is great news and overall I am extremely pleased with where we stand today. 10-06-2022 upon evaluation today patient actually appears to be making excellent progress at this point. Fortunately there does not appear to be any signs of active infection locally nor systemically which is great news and overall I am extremely pleased with where we stand. I do feel like he is actually making some pretty good progress here as we switch to the wrap along with the Iodoflex. 12/26; patient has a wound on the tip of his left heel. This is not a weightbearing surface. He is using a heel offloading boot and carefully offloading this at other times during the day. He is using Iodoflex and Zetuvitunder 3 layer compression 10-22-2022 upon evaluation today patient appears to be doing well currently in regard to his wound. He has been tolerating the dressing changes without complication. Fortunately there does not appear to be any signs of active infection locally nor systemically at this time. No fevers, chills, nausea, vomiting, or diarrhea. 08-30-2023 upon evaluation today patient appears to be doing well currently in regard to his heel ulcer which is showing signs of good improvement. Fortunately I do not see any evidence of  infection locally nor systemically which is great news and overall I am extremely pleased with where we stand. No fevers, chills, nausea, vomiting, or diarrhea. Unfortunately he does have a wound on the side of his fifth toe where I think this did rub on the shoe. 11-05-2022 upon evaluation today patient still still showing some signs of pressure at this point and there may have even been some fluid collection at this time. Fortunately I do not see any evidence of active infection locally nor systemically which is great news and overall I am extremely pleased with where we stand. No fevers, chills, nausea, vomiting, or diarrhea. 1/25; the patient's area on the dorsal aspect of the left fifth toe is healed. We have been using Iodoflex to the area on the left heel. The lateral wound is slightly down in length. 11-19-2022 upon evaluation today patient appears to be doing well currently in regard to his wound. He has been tolerating the dressing changes without complication. Fortunately there does not appear to be any signs of infection we been using Iodoflex up to this point. 11-26-2022 upon evaluation today patient appears to be doing poorly currently in regard to his wound. He has been tolerating the dressing changes with the Iodoflex without complication. With that being said he does not have any signs of active infection systemically though locally I do feel like there is some evidence of infection here. 12-03-2022 upon evaluation today patient appears to be doing well currently  in regard to his wound this is better with the Bactrim compared to last week still there was some significant slough and biofilm buildup which is going require sharp debridement at this point. Fortunately I do not see any signs of active infection locally nor systemically which is great news. JAMARCO, SCHRUPP ([id number]) [id number] Page 5 of 13 12-11-2022 upon evaluation today patient actually appears  to be doing significantly better at this time in regard to his heel. I am actually very pleased with where things stand currently. There is no signs of active infection locally or systemically at this point. 12-17-2022 upon evaluation today patient appears to be doing better as far as the overall appearance of his wound. I am actually very pleased in that regard. Fortunately there does not appear to be any signs of active infection locally nor systemically at this time. I do feel like that the patient is showing signs of excellent improvement in general. Nonetheless he needs something better for compression I think he should go ahead and see about getting compression socks. 3/7; tip of the left heel measurements are smaller he has been using Hydrofera Blue 01-07-2023 upon evaluation today patient appears to be doing well currently in regard to his wound. This has not been debrided since I last saw him and it definitely shows that definitely needs some debridement today. Fortunately I do not see any evidence of active infection locally nor systemically which is great news. 01-14-2023 upon evaluation today patient appears to be doing well currently although he does have some erythema and warmth to the heel I feel like he may have developed an infection yet again. This is something that is been ongoing issue we have been trying to manage his foot best we could. With that being said I do think he may need to go back on the Bactrim this has done well for him in the past. 01-21-2023 upon evaluation today patient appears to be doing better in regard to his heel. He is doing very well with the antibiotics and that is made a big difference for him based on what I am seeing I am very pleased with where we stand today. He is obviously doing extremely well which is great news. 01-28-2023 upon evaluation today patient appears to be doing well currently in regard to his wound. This is actually showing signs of excellent  improvement I am actually very pleased with where we stand and I do believe that he is making good progress here. Fortunately I do not see any evidence of active infection locally nor systemically which is great news. No fevers, chills, nausea, vomiting, or diarrhea. 02-04-2023 upon evaluation today patient's wound is actually showed signs of excellent improvement I am actually very pleased with where we stand. I do not see any signs of active infection locally nor systemically which is great news and in general I do believe that we are moving in the right direction here. No fevers, chills, nausea, vomiting, or diarrhea. 02-11-2023 upon evaluation patient's wound actually showing signs of excellent improvement I am actually very pleased with where we stand I think that his pain is better there is no signs of infection we will continue to use the gentamicin and overall we are making excellent progress. 02-18-2023 upon evaluation today patient appears to be doing well currently in regard to his wound. He has been tolerating the dressing changes without complication. Fortunately I do not see any signs of active infection locally nor systemically which is  great news. 02-25-2023 upon evaluation today patient's heel actually showed signs of excellent improvement. I am actually very pleased with where we stand today and I feel like that he is making really good progress. Fortunately I do not see any signs of active infection which is good news. No fevers, chills, nausea, vomiting, or diarrhea. 03-04-2023 upon evaluation today patient appears to be doing pretty well currently in regard to his heel. He has been tolerating the dressing changes without complication. Fortunately there does not appear to be any signs of active infection locally nor systemically which is great news. No fevers, chills, nausea, vomiting, or diarrhea. 03-11-2023 upon evaluation today patient appears to be doing poorly in regard to his heel  ulcer. He tells me he has been having to sleep on his back to having trouble breathing therefore I think he has been having some issues here as far as the heel is concerned it looks like he has a deep tissue injury yet again at this point. Fortunately I do not see any signs of infection which is good news but at the same time I do feel like that he is doing worse than where he was previous. I do not see any signs of active infection systemically which is great news. 03-18-2023 upon evaluation today patient appears to be doing well currently in fact the heel is significantly better compared to last week. I am extremely pleased with where we are seeing I am hopeful that he will continue to show signs of improvement going forward. 03-25-2023 upon evaluation today patient appears to be doing well currently in regard to his wounds. He is actually showing signs of improvement on the heel and I am very pleased in that regard. Fortunately I do not see any signs of active infection at this time which is great news. No fevers, chills, nausea, vomiting, or diarrhea. 04-05-2023 upon evaluation today patient's wound is actually showing signs of good improvement. Actually very pleased with where things stand today. Fortunately there does not appear to be any signs of active infection locally or systemically which is great news. 04-19-2023 upon evaluation today patient's wound actually looks the best it has in quite some time. He did have a wrap on for 2 weeks which I think is caused a little bit of irritation going to the forefoot everything else looks to be doing well the wrap did slide down a little bit as well. Fortunately I do not see any signs of infection however I do think that in the future if he has any issues with not being able to get scheduled to see me he should definitely make an appointment at least for a nurse visit he is aware and in agreement with plan going forward. 04-26-2023 upon evaluation today  patient's wound is actually significantly smaller this is great news he is making good progress in the past really 5 or 6 visits we have been seeing improvement week by week I am very pleased in that regard. 05-03-2023 upon evaluation today patient appears to be doing excellent in regard to the wound on his heel. This is actually showing signs of improvement he continues to make good progress towards complete closure which is great news. 7/23; wound continues to do nicely. Smaller with healthy epithelialization. This is actually a reopening we had already healed this area once and we discussed this this visit. 05-18-2023 upon evaluation today patient's wound actually showing signs of improvement. Fortunately I do not see any signs of active  infection locally or systemically which is great news. No fevers, chills, nausea, vomiting, or diarrhea. 05-24-2023 upon evaluation today patient's wound on the heel appears to be doing quite well. Fortunately there does not appear to be any signs of active infection at this time which is great news. Fortunately I think that he is making excellent headway with the current treatment regimen. 06-01-2023 upon evaluation today patient appears to be doing excellent in regard to his wound on the heel this is actually showing signs of being smaller measuring better and looking like is doing quite well. Fortunately I do not see any signs of infection at this time. He does have a small wound on the leg which we can also need to address this looks like it was just a small blister that ruptured I think it should heal pretty quickly. Electronic Signature(s) Signed: 06/01/2023 2:27:28 PM By: [name] Derry PA-C Entered By: [name] Derry on 06/01/2023 14:27:28 Barrie Folk ([id number]) [id number] Page 6 of 13 -------------------------------------------------------------------------------- Physical Exam Details Patient Name: Date of Service: [name],  [name] 06/01/2023 1:45 PM Medical Record Number: [id number] Patient Account Number: [id number] Date of Birth/Sex: Treating RN: Oct 19, 1946 (77 y.o. [name] Primary Care Provider: [name] Other Clinician: Referring Provider: Treating Provider/Extender: Hermine Messick Weeks in Treatment: 65 Constitutional Well-nourished and well-hydrated in no acute distress. Respiratory normal breathing without difficulty. Psychiatric this patient is able to make decisions and demonstrates good insight into disease process. Alert and Oriented x 3. pleasant and cooperative. Notes Upon inspection patient's wound bed actually showed signs of good granulation and epithelization at this point. Fortunately I do not see any signs of worsening overall and I do believe that the patient is making excellent headway towards complete closure which is great news. No fevers, chills, nausea, vomiting, or diarrhea. Electronic Signature(s) Signed: 06/01/2023 2:27:52 PM By: [name] Derry PA-C Entered By: [name] Derry on 06/01/2023 14:27:51 -------------------------------------------------------------------------------- Physician Orders Details Patient Name: Date of Service: [name], [name] 06/01/2023 1:45 PM Medical Record Number: [id number] Patient Account Number: [id number] Date of Birth/Sex: Treating RN: 1946-09-10 (77 y.o. 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 LEVENTE, GENTSCH ([id number])  [id number] Page 7 of 13 Bathing/ Shower/ Hygiene May shower; gently cleanse wound with antibacterial soap, rinse and pat dry prior to dressing wounds Off-Loading Open toe surgical shoe Wound Treatment Wound #10 - Calcaneus Wound Laterality: Left Topical: Mupirocin Ointment 1 x Per Week/30 Days Discharge Instructions: Apply as directed by provider. Prim Dressing: Hydrofera Blue Ready Transfer Foam, 2.5x2.5 (in/in) (Dispense As Written) 1 x Per Week/30 Days ary Discharge Instructions: Apply Hydrofera Blue Ready to wound bed as directed Secondary Dressing: ABD Pad 5x9 (in/in) 1 x Per Week/30 Days Discharge Instructions: Cover with ABD pad Compression Wrap: Urgo K2 Lite, two layer compression system, regular 1 x Per Week/30 Days Wound #15 - Lower Leg Wound Laterality: Left Cleanser: Soap and Water 1 x Per Week/15 Days Discharge Instructions: Gently cleanse wound with antibacterial soap, rinse and pat dry prior to dressing wounds Prim Dressing: Silvercel Small 2x2 (in/in) 1 x Per Week/15  Days ary Discharge Instructions: Apply Silvercel Small 2x2 (in/in) as instructed Secondary Dressing: ABD Pad 5x9 (in/in) 1 x Per Week/15 Days Discharge Instructions: Cover with ABD pad Compression Wrap: Urgo K2 Lite, two layer compression system, regular 1 x Per Week/15 Days Electronic Signature(s) Signed: 06/01/2023 4:30:30 PM By: [name] Derry PA-C Signed: 06/01/2023 4:40:48 PM By: Angelina Pih Entered By: Angelina Pih on 06/01/2023 14:41:53 -------------------------------------------------------------------------------- Problem List Details Patient Name: Date of Service: [name], [name] 06/01/2023 1:45 PM Medical Record Number: [id number] Patient Account Number: [id number] Date of Birth/Sex: Treating RN: 26-Mar-1946 (77 y.o. [name] Primary Care Provider: [name] Other Clinician: Betha Loa Referring Provider: Treating Provider/Extender: Hermine Messick Weeks in Treatment: 23 Active Problems ICD-10 Encounter Code Description Active Date MDM Diagnosis E11.621 Type 2 diabetes mellitus with foot ulcer 06/19/2022 No Yes 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sed 06/19/2022 No Yes CELVIN, PARA ([id number]) [id number] Page 8 of 13 E11.42 Type 2 diabetes mellitus with diabetic polyneuropathy 06/19/2022 No Yes Inactive Problems Resolved Problems Electronic Signature(s) Signed: 06/01/2023 1:38:47 PM By: [name] Derry PA-C Entered By: [name] Derry on 06/01/2023 13:38:47 -------------------------------------------------------------------------------- Progress Note Details Patient Name: Date of Service: [name], [name] 06/01/2023 1:45 PM Medical Record Number: [id number] Patient Account Number: [id number] Date of Birth/Sex: Treating RN: Sep 05, 1946 (77 y.o. [name] Primary Care Provider: [name] Other Clinician: Referring Provider: Treating Provider/Extender: [name] in Treatment: 78 Subjective Chief Complaint Information obtained from Patient Left heel ulcer History of Present Illness (HPI) 09/24/2020 on evaluation today patient presents today for a heel ulcer that he tells me has been present for about 2 years. He has been seeing podiatry and they have been attempting to manage this including what sounds to be a total contact cast, Unna boot, and just standard dressings otherwise as well. Most recently has been using triple antibiotic ointment. With that being said unfortunately despite everything he really has not had any significant improvement. He tells me that he cannot even really remember exactly how this began but he presumed it may have rubbed on his shoes or something of that nature. 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 He tells me that this does seem to help. Fortunately there is no signs of active infection at this time. His most recent hemoglobin A1c was 8.1 though he knows this was this year he cannot tell me the exact time. His fluid pills currently to help with some of the lower extremity edema although he does obviously have signs of venous stasis/lymphedema. Currently there is no evidence of active infection. No fevers, chills, nausea, vomiting, or diarrhea. Patient has had fairly recent ABIs which were performed on 07/19/2020 and revealed that he has normal findings in both the ankle and toe locations bilaterally. His ABI on the right was 1.09 on the left was 1.08 with a TBI on the right of 0.88 and on the left of 0.94. Triphasic flow was noted throughout. 10/08/2020 on evaluation today patient appears to be doing pretty well in regard to his left heel currently in fact this is doing a great job and seems to be  healing quite nicely. Unfortunately on his right leg he had a pile of wood that actually fell on him injuring his right leg this is somewhat erythematous has me concerned little bit about cellulitis though there is not really a good area to culture at this point. 10/24/2020 upon evaluation today patient appears to be doing well with regard to his heel ulcer. He is showing signs of improvement which is great news. His right leg is completely healed. Overall I feel like he is doing excellent and there is no signs of infection. 11/07/2020 upon evaluation today patient appears to be doing well with regard to his heel ulcer. He tells me that last week when he was unable to come in his wife actually thought that the wound was very close to closing if not closed. Then it began to "reopen again". I really feel  like what may have happened as the collagen may have dried over the wound bed and that because that misunderstanding with thinking that the wound was healing. With that being said I did not see it last week I do not know that for certain. Either way I feel like he is doing great today I see no signs of infection at this point. 11/26/2020 upon inspection today patient appears to be doing decently well in regard to his heel ulcer. He has been tolerating the dressing changes without complication. Fortunately there is no sign of active infection at this time. No fevers, chills, nausea, vomiting, or diarrhea. 12/10/2020 upon evaluation today patient appears to be doing fairly well in regard to the wound on his heel as well as what appears to be a new wound of the left first metatarsal head plantar aspect. This seems to be an area that was callus that has split as the patient tells me has been trying to walk on his toes more has probably where this came from. With that being said there does not appear to be signs of active infection which is great news. 12/17/2020 upon evaluation today patient actually appears to be making good progress currently. Fortunately there is no evidence of active infection at this time. Overall I feel like he is very close to complete closure. 12/26/2020 upon evaluation today patient appears to be doing excellent in regard to his wounds. In fact I am not certain that these are not even completely healed on initial inspection. Overall I am very pleased with where things stand today. Good news is that they are healed he is actually get ready to go out of town and that will be helpful as well as he will be a full part of the time. ROCKNEY, BALTHAZOR ([id number]) [id number] Page 9 of 13 Readmission: 02/04/2021 upon evaluation today patient appears to be doing well at this point in regard to his left heel that I previously saw him for. Unfortunately he is  having issues with his right lower extremity. He has significant wounds at this point he also has erythema noted there is definite signs of cellulitis which is unfortunate. With that being said I think we do need to address this sooner rather than later. The good news is he did have a nice trip to the beach. He tells me that he had no issues during that time. 4/27; patient on Bactrim. Culture showed methicillin sensitive staph aureus therefore the Bactrim should be effective. 2 small areas on the right leg are healed the area on the mid aspect of the tibia almost 100% covered in a very  adherent necrotic debris. We have been using silver alginate 02/20/2021 upon evaluation today patient appears to be doing well with regard to his wound. He is showing signs of improvement which is great news overall very pleased with where things stand today. No fevers, chills, nausea, vomiting, or diarrhea. 02/27/2021 upon evaluation today patient appears to be doing well with regard to his leg ulcer. He is tolerating the dressing changes and overall appears to be doing quite excellent. I am extremely pleased with where things stand and overall I think patient is making great progress. There is no sign of active infection at this time which is also great news. 03/06/2021 upon evaluation today patient appears to be doing excellent in regard to his wounds. In fact he appears to be completely healed today based on what I am seeing. This is excellent news and overall I am extremely pleased with where he stands. Overall the patient is happy to hear this as well this has been quite sometime coming. Readmission: 04/01/2021 patient unfortunately returns for readmission today. He tells me that he has been wearing his compression socks on the right leg daily and he does not really know what is going on and why his legs are doing what they are doing. We did therefore go ahead and probe deeper into exactly what has been going on with  him today. Subsequently the patient tells me that when he gets up and what we would call "first thing in the morning" are really 2 different things". He does not tend to sleep well so he tells me that he will often wake up at 3:00 in the morning. He will then potentially going into the living room to get in his chair where he may read a book for a little while and then potentially fall asleep back in his chair. He then subsequently wake up around 5:00 or so and then get up and in his words "putter around". This often will end with him proceeding at some point around 8 AM or 9 AM to putting on his compression socks. With that being said this means that anywhere from the 3:00 in the morning till roughly around 9:00 in the morning he has no compression on yet he is sitting in his recliner, walking around and up and about, and this is at least 5 to 6 hours of noncompressed time. That may be our issue here but I did not realize until we discussed this further today. Fortunately there does not appear to be any signs of active infection at this time which is great news. With that being said he has multiple wounds of the bilateral lower extremities. 04/10/2021 upon evaluation today patient appears to be doing well with regard to his legs for the most part. He does look like he had some injury where his wrap slid down nonetheless he did some "doctoring on them". I do feel like most of the areas honestly have cleared back up which is great news. There does not appear to be any signs of active infection at this time which is also great news. No fevers, chills, nausea, vomiting, or diarrhea. 04/18/2021 upon evaluation today patient appears to be doing well with regard to his wounds in fact he appears to be completely healed which is great news. Fortunately there does not appear to be any signs of active infection which is great news. No fevers, chills, nausea, vomiting, or diarrhea. READMISSION 07/28/2021 This is a  now 77 year old man we have had in  this clinic for quite a bit of this year. 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 He wears compression stockings. 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 He comes in an old running shoes although he says he wears better shoes at home. His history is that he noticed a blister in the right posterior heel. This open. He has been applying Neosporin to it currently the wound measures 2 x 1.4 cm. 100% slough covered. Not really offloading this in any rigorous way. Past medical history is essentially unchanged he has paroxysmal atrial fibrillation on Coumadin type 2 diabetes with a recent hemoglobin A1c of 7 on metformin. ABI in our clinic was 0.98 on the right 08/05/2021 upon evaluation today patient appears to be doing well with regard to his heel ulcer. Fortunately there does not appear to be any signs of active infection at this time. Overall I been very pleased with where things seem to be currently as far as the wound healing is concerned. Again this is first a lot of seeing him Dr. Leanord Hawking readmitted him last week. Nonetheless I think we are definitely making some progress here. 08/11/2021 upon evaluation today patient's wound on the heel actually showing signs of excellent improvement. I am actually very pleased with where things stand currently. No fevers, chills, nausea, vomiting, or diarrhea. There does not appear to be any need for sharp debridement today either which is also great news. 08/25/2021 upon evaluation today patient appears to be doing well with regard to his heel ulcer. This is actually looking significantly improved compared to last time I saw him. Fortunately there does not appear to be any evidence of active infection at this time. No fevers, chills, nausea, vomiting, or diarrhea. 09/01/2021 upon evaluation today patient appears to  be doing decently well in regard to his wounds. Fortunately there does not appear to be any signs of active infection at this time which is great news and overall very pleased in that regard. I do not see any evidence of active infection systemically which is great news as well. No fevers, chills, nausea, vomiting, or diarrhea. I think that the heel is making excellent progress. 09/15/2021 upon evaluation today patient's heel unfortunately is significantly worse compared to what it was previous. I do believe that at this time he would benefit from switching back to something a little bit more offloading from the cushion shoe he has right now this is more of a heel protector what I really need is a Prevalon offloading boot which I think is good to do a lot better for him than just the small heel protector. 09/23/2021 upon evaluation today patient appears to be doing a little better in regards to last week's visit. Overall I think that he is making good progress which is great news and there does not appear to be any evidence of active infection at this time. No fevers, chills, nausea, vomiting, or diarrhea. 09/30/2021 upon evaluation patient's heel is actually showing signs of good improvement which is great news. Fortunately there does not appear to be any evidence of active infection locally nor systemically at this point. No fevers, chills, nausea, vomiting, or diarrhea. 10/07/2021 upon evaluation today patient appears to be doing well currently in regard to his heel ulcer. He has been tolerating the dressing changes without complication. Fortunately I do not see any signs of active infection at this time which is great  news. No fevers, chills, nausea, vomiting, or diarrhea. 12/27; wound is measuring smaller. We have been using silver alginate 10/28/2021 upon evaluation today patient appears to be doing well currently in regard to his heel ulcer. I am actually very pleased with where things stand and  I think he is making excellent progress. Fortunately I do not see any signs of active infection locally nor systemically at this point which is great news. Nonetheless I do believe that the patient would benefit from a new offloading shoe and has been using it Velcro is no longer functioning properly and he tells me that he almost tripped and fell because of that today. Obviously I do not want him following that would be very bad. 11/11/2021 upon evaluation today patient appears to be doing excellent in regard to his wound. In fact this appears to be completely healed based on what I see KHANYE, CLEMENT ([id number]) [id number] Page 10 of 13 currently. I do not see any signs of anything open or draining and I did double check just by clearing away some of the callus around the edges of the wound to ensure that there was nothing still open or hiding underneath. Once this was cleared away it appeared that the patient was doing significantly better at this time which is great news and there was nothing actually open. Readmission: 06-19-2022 upon evaluation today patient appears to be doing well currently in regard to his heel ulcer all things considered. He unfortunately is having a bit of breakdown in general as far as the heel is concerned not nearly as bad as what we noted last time he was here in the clinic. The good news is I do think that this should hopefully heal much more rapidly than what we noted last time. His past medical history has not changed he is on Coumadin. 07-03-2022 upon evaluation today patient appears to be doing better in regard to his heel. We will start to see some improvement here which is good news. Fortunately I do not see any evidence of infection locally or systemically at this time which is great news. No fevers, chills, nausea, vomiting, or diarrhea. 07-10-2022 upon evaluation today patient appears to be doing well currently in regard to his wound  from the callus standpoint around the edges. Unfortunately from an actual wound bed standpoint he has some deep tissue injury noted at this point. Again I am not sure exactly what is going on that is causing this pressure to the region but he is definitely gotten pressure that is occurring and causing this to not heal as effectively as what we would like to see. I discussed with him today that he may need to at night when he sleeping use a pillow up underneath his calf in order to prevent the heel from touching the bed at all. I also think that it may be beneficial for him to monitor throughout his day and make sure there is no other time when he is getting the pressure to the heel. 07-23-2022 upon evaluation today patient appears to be doing poorly currently in regard to his heel ulcer. He has been tolerating the dressing changes without complication. Unfortunately I feel like he may be infected based on what I am seeing. 07-28-2022 I did review patient's culture results which showed positive for Proteus as well as Staphylococcus both of which would be treated with the Bactrim DS that I gave him at the last visit. This is good news and hopefully means  that we should be able to apply the cast today as long as the wound itself does not appear to be doing poorly. Patient's wound bed actually showed signs of doing quite well and I am very pleased with where things stand and I do not see any signs of worsening overall which is great news. 08-04-2022 upon evaluation today patient appears to be doing well currently in regard to his heel ulcer. I am actually very pleased with where things stand and I do think this is looking significantly better. With regard to his right shin that is also showing signs of improvement which is great news. 08-10-2022 upon evaluation today patient's wound on the heel actually appears to be doing significantly better. In regard to the right anterior shin this is showing signs of  healing it might even be completely healed but I still get a monitor I cannot get anything to fill away but also could not see where there was anything actually draining at this point. I am tending to think healed but I want a monitor 1 more week before I call it for sure. 08-17-2022 upon evaluation today patient appears to be doing well currently in regard to his heel. Fortunately he is tolerating the dressing changes without complication. I do not see any signs of infection and overall I think he is making good progress here. We did get approval for the Apligraf but I think he had a $295 co-pay that would have to be paid per application. With that being said as good as he is doing right now I do not think that is necessary but is still an option depending on how things progress if we need to speed this up quite a bit. 08/24/2022; this patient has a wound on his left heel in the setting of type 2 diabetes. We have been using Prisma. He has a heel offloading boot 08-31-2022 upon evaluation today patient appears to be doing poorly in regard to his heel ulcer which is still showing signs of bruising I am just not as happy as I was when I saw him 2 weeks ago with this. He saw Dr. Leanord Hawking last week. Also did not look good at that point apparently. Nonetheless I think that the patient is still continuing to have some counterpressure getting to the wound bed unfortunately. 09-07-2022 upon evaluation today patient appears to be doing well currently in regard to his heel ulcer. He has been tolerating the dressing changes without complication. Fortunately there does not appear to be any signs of active infection locally or systemically at this time. Fortunately I do not see any evidence of active infection at this time. 09-15-2022 upon evaluation today patient appears to be doing well currently in regard to his legs which in fact appear to be completely healed this is great news. With that being said his heel  does have some erythema and warmth as well as drainage I am concerned about cellulitis at this location. 09-21-2022 upon evaluation today patient's wound is actually showing signs of being a little bit smaller but still we are not doing nearly as well as what I would like to see. Fortunately I do not see any signs of infection at this time which is great news and overall I am extremely pleased in that regard. With that being said I do think that he needs to continue to elevate his leg although the edema is under great control with a compression wrap I think switching to a different dressing  topically may be beneficial. My suggestion is to switch him over to a Iodoflex dressing. 09-28-2022 upon evaluation today patient appears to be doing well with regard to his heel ulcer. This is actually showing signs of improvement which is great news and overall I am extremely pleased with where we stand today. 10-06-2022 upon evaluation today patient actually appears to be making excellent progress at this point. Fortunately there does not appear to be any signs of active infection locally nor systemically which is great news and overall I am extremely pleased with where we stand. I do feel like he is actually making some pretty good progress here as we switch to the wrap along with the Iodoflex. 12/26; patient has a wound on the tip of his left heel. This is not a weightbearing surface. He is using a heel offloading boot and carefully offloading this at other times during the day. He is using Iodoflex and Zetuvitunder 3 layer compression 10-22-2022 upon evaluation today patient appears to be doing well currently in regard to his wound. He has been tolerating the dressing changes without complication. Fortunately there does not appear to be any signs of active infection locally nor systemically at this time. No fevers, chills, nausea, vomiting, or diarrhea. 08-30-2023 upon evaluation today patient appears to be  doing well currently in regard to his heel ulcer which is showing signs of good improvement. Fortunately I do not see any evidence of infection locally nor systemically which is great news and overall I am extremely pleased with where we stand. No fevers, chills, nausea, vomiting, or diarrhea. Unfortunately he does have a wound on the side of his fifth toe where I think this did rub on the shoe. 11-05-2022 upon evaluation today patient still still showing some signs of pressure at this point and there may have even been some fluid collection at this time. Fortunately I do not see any evidence of active infection locally nor systemically which is great news and overall I am extremely pleased with where we stand. No fevers, chills, nausea, vomiting, or diarrhea. 1/25; the patient's area on the dorsal aspect of the left fifth toe is healed. We have been using Iodoflex to the area on the left heel. The lateral wound is slightly down in length. 11-19-2022 upon evaluation today patient appears to be doing well currently in regard to his wound. He has been tolerating the dressing changes without complication. Fortunately there does not appear to be any signs of infection we been using Iodoflex up to this point. 11-26-2022 upon evaluation today patient appears to be doing poorly currently in regard to his wound. He has been tolerating the dressing changes with the Iodoflex without complication. With that being said he does not have any signs of active infection systemically though locally I do feel like there is some evidence of infection here. 12-03-2022 upon evaluation today patient appears to be doing well currently in regard to his wound this is better with the Bactrim compared to last week still there was some significant slough and biofilm buildup which is going require sharp debridement at this point. Fortunately I do not see any signs of active infection locally nor systemically which is great  news. JANOS, MELCHIORRE ([id number]) [id number] Page 11 of 13 12-11-2022 upon evaluation today patient actually appears to be doing significantly better at this time in regard to his heel. I am actually very pleased with where things stand currently. There is no signs of active infection locally or systemically  at this point. 12-17-2022 upon evaluation today patient appears to be doing better as far as the overall appearance of his wound. I am actually very pleased in that regard. Fortunately there does not appear to be any signs of active infection locally nor systemically at this time. I do feel like that the patient is showing signs of excellent improvement in general. Nonetheless he needs something better for compression I think he should go ahead and see about getting compression socks. 3/7; tip of the left heel measurements are smaller he has been using Hydrofera Blue 01-07-2023 upon evaluation today patient appears to be doing well currently in regard to his wound. This has not been debrided since I last saw him and it definitely shows that definitely needs some debridement today. Fortunately I do not see any evidence of active infection locally nor systemically which is great news. 01-14-2023 upon evaluation today patient appears to be doing well currently although he does have some erythema and warmth to the heel I feel like he may have developed an infection yet again. This is something that is been ongoing issue we have been trying to manage his foot best we could. With that being said I do think he may need to go back on the Bactrim this has done well for him in the past. 01-21-2023 upon evaluation today patient appears to be doing better in regard to his heel. He is doing very well with the antibiotics and that is made a big difference for him based on what I am seeing I am very pleased with where we stand today. He is obviously doing extremely well which is great  news. 01-28-2023 upon evaluation today patient appears to be doing well currently in regard to his wound. This is actually showing signs of excellent improvement I am actually very pleased with where we stand and I do believe that he is making good progress here. Fortunately I do not see any evidence of active infection locally nor systemically which is great news. No fevers, chills, nausea, vomiting, or diarrhea. 02-04-2023 upon evaluation today patient's wound is actually showed signs of excellent improvement I am actually very pleased with where we stand. I do not see any signs of active infection locally nor systemically which is great news and in general I do believe that we are moving in the right direction here. No fevers, chills, nausea, vomiting, or diarrhea. 02-11-2023 upon evaluation patient's wound actually showing signs of excellent improvement I am actually very pleased with where we stand I think that his pain is better there is no signs of infection we will continue to use the gentamicin and overall we are making excellent progress. 02-18-2023 upon evaluation today patient appears to be doing well currently in regard to his wound. He has been tolerating the dressing changes without complication. Fortunately I do not see any signs of active infection locally nor systemically which is great news. 02-25-2023 upon evaluation today patient's heel actually showed signs of excellent improvement. I am actually very pleased with where we stand today and I feel like that he is making really good progress. Fortunately I do not see any signs of active infection which is good news. No fevers, chills, nausea, vomiting, or diarrhea. 03-04-2023 upon evaluation today patient appears to be doing pretty well currently in regard to his heel. He has been tolerating the dressing changes without complication. Fortunately there does not appear to be any signs of active infection locally nor systemically which is  great news. No fevers, chills, nausea, vomiting, or diarrhea. 03-11-2023 upon evaluation today patient appears to be doing poorly in regard to his heel ulcer. He tells me he has been having to sleep on his back to having trouble breathing therefore I think he has been having some issues here as far as the heel is concerned it looks like he has a deep tissue injury yet again at this point. Fortunately I do not see any signs of infection which is good news but at the same time I do feel like that he is doing worse than where he was previous. I do not see any signs of active infection systemically which is great news. 03-18-2023 upon evaluation today patient appears to be doing well currently in fact the heel is significantly better compared to last week. I am extremely pleased with where we are seeing I am hopeful that he will continue to show signs of improvement going forward. 03-25-2023 upon evaluation today patient appears to be doing well currently in regard to his wounds. He is actually showing signs of improvement on the heel and I am very pleased in that regard. Fortunately I do not see any signs of active infection at this time which is great news. No fevers, chills, nausea, vomiting, or diarrhea. 04-05-2023 upon evaluation today patient's wound is actually showing signs of good improvement. Actually very pleased with where things stand today. Fortunately there does not appear to be any signs of active infection locally or systemically which is great news. 04-19-2023 upon evaluation today patient's wound actually looks the best it has in quite some time. He did have a wrap on for 2 weeks which I think is caused a little bit of irritation going to the forefoot everything else looks to be doing well the wrap did slide down a little bit as well. Fortunately I do not see any signs of infection however I do think that in the future if he has any issues with not being able to get scheduled to see me he  should definitely make an appointment at least for a nurse visit he is aware and in agreement with plan going forward. 04-26-2023 upon evaluation today patient's wound is actually significantly smaller this is great news he is making good progress in the past really 5 or 6 visits we have been seeing improvement week by week I am very pleased in that regard. 05-03-2023 upon evaluation today patient appears to be doing excellent in regard to the wound on his heel. This is actually showing signs of improvement he continues to make good progress towards complete closure which is great news. 7/23; wound continues to do nicely. Smaller with healthy epithelialization. This is actually a reopening we had already healed this area once and we discussed this this visit. 05-18-2023 upon evaluation today patient's wound actually showing signs of improvement. Fortunately I do not see any signs of active infection locally or systemically which is great news. No fevers, chills, nausea, vomiting, or diarrhea. 05-24-2023 upon evaluation today patient's wound on the heel appears to be doing quite well. Fortunately there does not appear to be any signs of active infection at this time which is great news. Fortunately I think that he is making excellent headway with the current treatment regimen. 06-01-2023 upon evaluation today patient appears to be doing excellent in regard to his wound on the heel this is actually showing signs of being smaller measuring better and looking like is doing quite well. Fortunately I  do not see any signs of infection at this time. He does have a small wound on the leg which we can also need to address this looks like it was just a small blister that ruptured I think it should heal pretty quickly. [address] DAVIER, DOHMEN ([id number]) [id number] Page 12 of 13 Constitutional Well-nourished and well-hydrated in no acute distress. Vitals Time Taken: 1:52 PM, Height:  70 in, Weight: 265 lbs, BMI: 38, Temperature: 98 F, Pulse: 80 bpm, Respiratory Rate: 18 breaths/min, Blood Pressure: 90/53 mmHg. General Notes: pt states he is always dizzy Respiratory normal breathing without difficulty. Psychiatric this patient is able to make decisions and demonstrates good insight into disease process. Alert and Oriented x 3. pleasant and cooperative. General Notes: Upon inspection patient's wound bed actually showed signs of good granulation and epithelization at this point. Fortunately I do not see any signs of worsening overall and I do believe that the patient is making excellent headway towards complete closure which is great news. No fevers, chills, nausea, vomiting, or diarrhea. Integumentary (Hair, Skin) Wound #10 status is Open. Original cause of wound was Gradually Appeared. The date acquired was: 06/05/2022. The wound has been in treatment 49 weeks. The wound is located on the Left Calcaneus. The wound measures 1.2cm length x 0.9cm width x 0.1cm depth; 0.848cm^2 area and 0.085cm^3 volume. There is Fat Layer (Subcutaneous Tissue) exposed. There is no tunneling or undermining noted. There is a medium amount of serosanguineous drainage noted. The wound margin is flat and intact. There is small (1-33%) pink granulation within the wound bed. There is a small (1-33%) amount of necrotic tissue within the wound bed including Adherent Slough. Wound #15 status is Open. Original cause of wound was Gradually Appeared. The date acquired was: 06/01/2023. The wound is located on the Left Lower Leg. The wound measures 2.4cm length x 0.9cm width x 0.1cm depth; 1.696cm^2 area and 0.17cm^3 volume. There is Fat Layer (Subcutaneous Tissue) exposed. There is no tunneling or undermining noted. There is a medium amount of serosanguineous drainage noted. There is large (67-100%) red granulation within the wound bed. There is no necrotic tissue within the wound bed. 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 There was a Excisional Skin/Subcutaneous Tissue Debridement with a total area of 0.85 sq cm performed by [name] Derry, PA-C. With the following instrument(s): Curette to remove Viable and Non-Viable tissue/material. Material removed includes Callus, Subcutaneous Tissue, and Slough. No specimens were taken. A time out was conducted at 14:23, prior to the start of the procedure. A Moderate amount of bleeding was controlled with Pressure. The procedure was tolerated well. Post Debridement Measurements: 1.2cm length x 0.9cm width x 0.1cm depth; 0.085cm^3 volume. Character of Wound/Ulcer Post Debridement is stable. Severity of Tissue Post Debridement is: Fat layer exposed. Post procedure Diagnosis Wound #10: Same as Pre-Procedure Plan 1. I am going to recommend that we have the patient continue to monitor for any signs of infection or worsening. The patient is in agreement with plan although right now recommend continue with the [facility] along with the mupirocin which seems to be doing quite well. 2. Also can recommend the patient should continue to keep  pressure off of the heel obviously I think this is beneficial when he does and right now I see no signs of active pressure buildup which is good news. 3. Muscle can do suggest that he should continue to use the compression wrapping I think this would be good for both the heel wound as well as the leg wound we are going to using Hydrofera Blue on the leg as well. We will see patient back for reevaluation in 1 week here in the clinic. If anything worsens or changes  patient will contact our office for additional recommendations. Electronic Signature(s) Signed: 06/01/2023 2:28:58 PM By: [name] Derry PA-C Entered By: [name] Derry on 06/01/2023 14:28:57 Barrie Folk ([id number]) [id number] Page 13 of 13 -------------------------------------------------------------------------------- SuperBill Details Patient Name: Date of Service: [name], [name] 06/01/2023 Medical Record Number: [id number] Patient Account Number: [id number] Date of Birth/Sex: Treating RN: November 03, 1945 (77 y.o. 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 Facility Procedures : CPT4 Code: 78469629 Description: 11042 - DEB SUBQ TISSUE 20 SQ CM/< ICD-10 Diagnosis Description 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sed Modifier: Quantity: 1 Physician Procedures : CPT4 Code Description Modifier [id number] 11042 - WC PHYS SUBQ TISS 20 SQ CM ICD-10 Diagnosis Description 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sed Quantity: 1 Electronic Signature(s) Signed: 06/01/2023 4:30:30 PM By: [name] Derry PA-C Signed: 06/01/2023 4:40:48 PM By: Angelina Pih Previous Signature: 06/01/2023 2:29:40 PM Version By: [name] Derry PA-C Entered By: Angelina Pih on 06/01/2023 14:42:43

## 2023-06-01 NOTE — Progress Notes (Addendum)
[name], [name] ([id number]) [id number] Page 1 of 10 Visit Report for 06/01/2023 Arrival Information Details Patient Name: Date of Service: [name], [name] [facility] J. 06/01/2023 1:45 PM Medical Record Number: [id number] Patient Account Number: [id number] Date of Birth/Sex: Treating RN: 08-Dec-1945 (77 y.o. [name] Primary Care : [name] Other Clinician: Betha Loa Referring : Treating /Extender: [name] in Treatment: 95 Visit Information History Since Last Visit Added or deleted any medications: No Patient Arrived: [name] Any new allergies or adverse reactions: No Arrival Time: 13:51 Had a fall or experienced change in No Accompanied By: self activities of daily living that may affect Transfer Assistance: None risk of falls: Patient Identification Verified: Yes Hospitalized since last visit: No Secondary Verification Process Completed: Yes Has Dressing in Place as Prescribed: Yes Patient Requires Transmission-Based Precautions: No Has Compression in Place as Prescribed: Yes Patient Has Alerts: Yes Pain Present Now: Yes Patient Alerts: Patient on Blood Thinner Warfarin Type II Diabetic Electronic Signature(s) Signed: 06/01/2023 4:40:48 PM By: Angelina Pih Entered By: Angelina Pih on 06/01/2023 13:52:15 -------------------------------------------------------------------------------- Clinic Level of Care Assessment Details Patient Name: Date of Service: [name] [facility] J. 06/01/2023 1:45 PM Medical Record Number: [id number] Patient Account Number: [id number] Date of Birth/Sex: Treating RN: 1946/07/31 (77 y.o. [name] Primary Care : [name] Other Clinician: Referring : Treating /Extender: [name] in Treatment: 49 Clinic Level of Care Assessment Items TOOL 1 Quantity Score []  - 0 Use when EandM and Procedure  is performed on INITIAL visit ASSESSMENTS - Nursing Assessment / Reassessment []  - 0 [name] Physical Exam (combine w/ comprehensive assessment (listed just below) when performed on new pt. evals) []  - 0 Comprehensive Assessment (HX, ROS, Risk Assessments, Wounds Hx, etc.) ASSESSMENTS - Wound and Skin Assessment / Reassessment []  - 0 Dermatologic / Skin Assessment (not related to wound area) [name] ([id number]) [id number] Page 2 of 10 ASSESSMENTS - Ostomy and/or Continence Assessment and Care []  - 0 Incontinence Assessment and Management []  - 0 Ostomy Care Assessment and Management (repouching, etc.) PROCESS - Coordination of Care []  - 0 Simple Patient / Family Education for ongoing care []  - 0 Complex (extensive) Patient / Family Education for ongoing care []  - 0 Staff obtains Chiropractor, Records, T Results / Process Orders est []  - 0 Staff telephones HHA, Nursing Homes / Clarify orders / etc []  - 0 Routine Transfer to another Facility (non-emergent condition) []  - 0 Routine Hospital Admission (non-emergent condition) []  - 0 New Admissions / Manufacturing engineer / Ordering NPWT Apligraf, etc. , []  - 0 Emergency Hospital Admission (emergent condition) PROCESS - Special Needs []  - 0 Pediatric / Minor Patient Management []  - 0 Isolation Patient Management []  - 0 Hearing / Language / Visual special needs []  - 0 Assessment of Community assistance (transportation, D/C planning, etc.) []  - 0 Additional assistance / Altered mentation []  - 0 Support Surface(s) Assessment (bed, cushion, seat, etc.) INTERVENTIONS - Miscellaneous []  - 0 External ear exam []  - 0 Patient Transfer (multiple staff / Nurse, adult / Similar devices) []  - 0 Simple Staple / Suture removal (25 or less) []  - 0 Complex Staple / Suture removal (26 or more) []  - 0 Hypo/Hyperglycemic Management (do not check if billed separately) []  - 0 Ankle / Brachial Index  (ABI) - do not check if billed separately Has the patient been seen at the hospital within the last three years: Yes Total Score: 0 Level Of Care:  ____ Electronic Signature(s) Signed: 06/01/2023 4:40:48 PM By: Angelina Pih Entered By: Angelina Pih on 06/01/2023 14:42:35 -------------------------------------------------------------------------------- Compression Therapy Details Patient Name: Date of Service: [name], [name] [name] 06/01/2023 1:45 PM Medical Record Number: [id number] Patient Account Number: [id number] Date of Birth/Sex: Treating RN: 1946-08-14 (77 y.o. [name] Primary Care : [name] Other Clinician: Referring : Treating /Extender: [name] Weeks in Treatment: 62 Compression Therapy Performed for Wound Assessment: Wound #10 Left Calcaneus Performed By: Holly Bodily, RN [name], [name] ([id number]) [phone].pdf Page 3 of 10 Compression Type: Double Layer Post Procedure Diagnosis Same as Pre-procedure Electronic Signature(s) Signed: 06/01/2023 4:40:48 PM By: Angelina Pih Entered By: Angelina Pih on 06/01/2023 14:42:24 -------------------------------------------------------------------------------- Compression Therapy Details Patient Name: Date of Service: [name], [name] [name] 06/01/2023 1:45 PM Medical Record Number: [id number] Patient Account Number: [id number] Date of Birth/Sex: Treating RN: 04-24-1946 (77 y.o. [name] Primary Care : [name] Other Clinician: Referring : Treating /Extender: [name] Weeks in Treatment: 49 Compression Therapy Performed for Wound Assessment: Wound #15 Left Lower Leg Performed By: Clinician Angelina Pih, RN Compression Type: Double Layer Post Procedure Diagnosis Same as Pre-procedure Electronic Signature(s) Signed: 06/01/2023 4:40:48 PM By: Angelina Pih Entered By: Angelina Pih on 06/01/2023 14:42:25 -------------------------------------------------------------------------------- Encounter Discharge Information Details Patient Name: Date of Service: [name], [name] [name] 06/01/2023 1:45 PM Medical Record Number: [id number] Patient Account Number: [id number] Date of Birth/Sex: Treating RN: 10/01/1946 (77 y.o. [name] Primary Care : [name] Other Clinician: Referring : Treating /Extender: [name] in Treatment: 64 Encounter Discharge Information Items Post Procedure Vitals Discharge Condition: Stable Temperature (F): 98 Ambulatory Status: Walker Pulse (bpm): 80 Discharge Destination: Home Respiratory Rate (breaths/min): 18 Transportation: Private Auto Blood Pressure (mmHg): 90/53 Accompanied By: self Schedule Follow-up Appointment: Yes Clinical Summary of Care: [name], [name] ([id number]) [id number] Page 4 of 10 Notes PA Stone made aware of decreased BP Electronic Signature(s) Signed: 06/01/2023 4:40:48 PM By: Angelina Pih Entered By: Angelina Pih on 06/01/2023 14:44:20 -------------------------------------------------------------------------------- Lower Extremity Assessment Details Patient Name: Date of Service: [name] [facility] J. 06/01/2023 1:45 PM Medical Record Number: [id number] Patient Account Number: [id number] Date of Birth/Sex: Treating RN: September 20, 1946 (77 y.o. [name] Primary Care : [name] Other Clinician: Betha Loa Referring : Treating /Extender: [name] Weeks in Treatment: 49 Edema Assessment Assessed: [Left: No] [Right: No] Edema: [Left: Ye] [Right: s] Calf Left: Right: Point of Measurement: 33 cm From Medial Instep 41 cm Ankle Left: Right: Point of Measurement: 11 cm From Medial Instep 22.2 cm Vascular  Assessment Pulses: Dorsalis Pedis Palpable: [Left:No] Doppler Audible: [Left:Yes] Posterior Tibial Doppler Audible: [Left:Yes] Extremity colors, hair growth, and conditions: Extremity Color: [Left:Red] Hair Growth on Extremity: [Left:No] Temperature of Extremity: [Left:Warm < 3 seconds] Toe Nail Assessment Left: Right: Thick: Yes Discolored: Yes Deformed: Yes Improper Length and Hygiene: No Electronic Signature(s) Signed: 06/01/2023 4:40:48 PM By: Angelina Pih Entered By: Angelina Pih on 06/01/2023 14:06:18 [name] ([id number]) [id number] Page 5 of 10 -------------------------------------------------------------------------------- Multi Wound Chart Details Patient Name: Date of Service: [name], [name] [name] 06/01/2023 1:45 PM Medical Record Number: [id number] Patient Account Number: [id number] Date of Birth/Sex: Treating RN: 1945/11/01 (77 y.o. [name] Primary Care : [name] Other Clinician: Referring : Treating /Extender: [name] Weeks in Treatment: 71 Vital Signs Height(in): 70 Pulse(bpm): 80 Weight(lbs): 265 Blood Pressure(mmHg): 90/53 Body Mass Index(BMI): 38  Temperature(F): 98 Respiratory Rate(breaths/min): 18 [10:Photos:] [N/A:N/A] Left Calcaneus Left Lower Leg N/A Wound Location: Gradually Appeared Gradually Appeared N/A Wounding Event: Diabetic Wound/Ulcer of the Lower T be determined o N/A Primary Etiology: Extremity Pressure Ulcer N/A N/A Secondary Etiology: Cataracts, Arrhythmia, Coronary Cataracts, Arrhythmia, Coronary N/A Comorbid History: Artery Disease, Hypertension, Type IIArtery Disease, Hypertension, Type II Diabetes, Osteoarthritis, Neuropathy Diabetes, Osteoarthritis, Neuropathy 06/05/2022 06/01/2023 N/A Date Acquired: 49 0 N/A Weeks of Treatment: Open Open N/A Wound Status: No No N/A Wound Recurrence: 1.2x0.9x0.1 2.4x0.9x0.1  N/A Measurements L x W x D (cm) 0.848 1.696 N/A A (cm) : rea 0.085 0.17 N/A Volume (cm) : 40.00% N/A N/A % Reduction in Area: 70.00% N/A N/A % Reduction in Volume: Grade 1 Full Thickness Without Exposed N/A Classification: Support Structures Medium Medium N/A Exudate Amount: Serosanguineous Serosanguineous N/A Exudate Type: red, brown red, brown N/A Exudate Color: Flat and Intact N/A N/A Wound Margin: Small (1-33%) Large (67-100%) N/A Granulation Amount: Pink Red N/A Granulation Quality: Small (1-33%) None Present (0%) N/A Necrotic Amount: Fat Layer (Subcutaneous Tissue): Yes Fat Layer (Subcutaneous Tissue): Yes N/A Exposed Structures: Fascia: No Tendon: No Muscle: No Joint: No Bone: No Medium (34-66%) None N/A Epithelialization: Treatment Notes Electronic Signature(s) Signed: 06/01/2023 4:40:48 PM By: Burman Riis ([id number]) [id number] Page 6 of 10 Entered By: Angelina Pih on 06/01/2023 14:22:56 -------------------------------------------------------------------------------- Multi-Disciplinary Care Plan Details Patient Name: Date of Service: [name] [facility] J. 06/01/2023 1:45 PM Medical Record Number: [id number] Patient Account Number: [id number] Date of Birth/Sex: Treating RN: 17-Oct-1946 (77 y.o. [name] Primary Care : [name] Other Clinician: Referring : Treating /Extender: [name] in Treatment: 50 Active Inactive Wound/Skin Impairment Nursing Diagnoses: Impaired tissue integrity Goals: Patient/caregiver will verbalize understanding of skin care regimen Date Initiated: 06/19/2022 Target Resolution Date: 07/19/2022 Goal Status: Active Ulcer/skin breakdown will have a volume reduction of 30% by week 4 Date Initiated: 06/19/2022 Date Inactivated: 07/28/2022 Target Resolution Date: 07/17/2022 Goal Status: Unmet Unmet Reason:  infection Ulcer/skin breakdown will have a volume reduction of 50% by week 8 Date Initiated: 07/28/2022 Date Inactivated: 05/18/2023 Target Resolution Date: 03/20/2023 Goal Status: Unmet Unmet Reason: comorbidities Interventions: Assess patient/caregiver ability to obtain necessary supplies Assess patient/caregiver ability to perform ulcer/skin care regimen upon admission and as needed Assess ulceration(s) every visit Provide education on ulcer and skin care Treatment Activities: Skin care regimen initiated : 06/19/2022 Topical wound management initiated : 06/19/2022 Notes: Electronic Signature(s) Signed: 06/01/2023 4:40:48 PM By: Angelina Pih Entered By: Angelina Pih on 06/01/2023 14:42:58 Pain Assessment Details -------------------------------------------------------------------------------- [name] ([id number]) [id number] Page 7 of 10 Patient Name: Date of Service: [name], [name] [facility] J. 06/01/2023 1:45 PM Medical Record Number: [id number] Patient Account Number: [id number] Date of Birth/Sex: Treating RN: 08/21/1946 (77 y.o. [name] Primary Care : [name] Other Clinician: Betha Loa Referring : Treating /Extender: [name] in Treatment: 63 Active Problems Location of Pain Severity and Description of Pain Patient Has Paino Yes Site Locations Rate the pain. Current Pain Level: 7 Pain Management and Medication Current Pain Management: Notes generalized pain Electronic Signature(s) Signed: 06/01/2023 4:40:48 PM By: Angelina Pih Entered By: Angelina Pih on 06/01/2023 13:54:00 -------------------------------------------------------------------------------- Patient/Caregiver Education Details Patient Name: Date of Service: [name], [name] [name] 8/13/2024andnbsp1:45 PM Medical Record Number: [id number] Patient Account Number: [id number] Date of Birth/Gender: Treating  RN: Nov 04, 1945 (77 y.o. [name] Primary Care Physician: [name] Other Clinician: Referring Physician: Treating Physician/Extender: Larina Bras,  Margret Chance, Tinnie Gens Weeks in Treatment: 49 Education Assessment Education Provided To: Patient Education Topics Provided Wound Debridement: Handouts: Wound Debridement Methods: Explain/Verbal [name], [name] ([id number]) [id number] Page 8 of 10 Responses: State content correctly Wound/Skin Impairment: Handouts: Caring for Your Ulcer Methods: Explain/Verbal Responses: State content correctly Electronic Signature(s) Signed: 06/01/2023 4:40:48 PM By: Angelina Pih Entered By: Angelina Pih on 06/01/2023 14:43:18 -------------------------------------------------------------------------------- Wound Assessment Details Patient Name: Date of Service: [name], [name] [name] 06/01/2023 1:45 PM Medical Record Number: [id number] Patient Account Number: [id number] Date of Birth/Sex: Treating RN: 07/30/1946 (77 y.o. [name] Primary Care : [name] Other Clinician: Betha Loa Referring : Treating /Extender: [name] Weeks in Treatment: 49 Wound Status Wound Number: 10 Primary Diabetic Wound/Ulcer of the Lower Extremity Etiology: Wound Location: Left Calcaneus Secondary Pressure Ulcer Wounding Event: Gradually Appeared Etiology: Date Acquired: 06/05/2022 Wound Open Weeks Of Treatment: 49 Status: Clustered Wound: No Comorbid Cataracts, Arrhythmia, Coronary Artery Disease, Hypertension, History: Type II Diabetes, Osteoarthritis, Neuropathy Photos Wound Measurements Length: (cm) 1.2 Width: (cm) 0.9 Depth: (cm) 0.1 Area: (cm) 0.848 Volume: (cm) 0.085 % Reduction in Area: 40% % Reduction in Volume: 70% Epithelialization: Medium (34-66%) Tunneling: No Undermining: No Wound Description Classification: Grade 1 Wound Margin: Flat and  Intact Exudate Amount: Medium Exudate Type: Serosanguineous Exudate Color: red, brown Foul Odor After Cleansing: No Slough/Fibrino Yes Wound Bed [name], [name] ([id number]) [id number] Page 9 of 10 Granulation Amount: Small (1-33%) Exposed Structure Granulation Quality: Pink Fascia Exposed: No Necrotic Amount: Small (1-33%) Fat Layer (Subcutaneous Tissue) Exposed: Yes Necrotic Quality: Adherent Slough Tendon Exposed: No Muscle Exposed: No Joint Exposed: No Bone Exposed: No Treatment Notes Wound #10 (Calcaneus) Wound Laterality: Left Cleanser Peri-Wound Care Topical Mupirocin Ointment Discharge Instruction: Apply as directed by . Primary Dressing Hydrofera Blue Ready Transfer Foam, 2.5x2.5 (in/in) Discharge Instruction: Apply Hydrofera Blue Ready to wound bed as directed Secondary Dressing ABD Pad 5x9 (in/in) Discharge Instruction: Cover with ABD pad Secured With Compression Wrap Urgo K2 Lite, two layer compression system, regular Compression Stockings Add-Ons Electronic Signature(s) Signed: 06/01/2023 4:40:48 PM By: Angelina Pih Entered By: Angelina Pih on 06/01/2023 14:05:29 -------------------------------------------------------------------------------- Wound Assessment Details Patient Name: Date of Service: [name], [name] [name] 06/01/2023 1:45 PM Medical Record Number: [id number] Patient Account Number: [id number] Date of Birth/Sex: Treating RN: Apr 12, 1946 (77 y.o. [name] Primary Care : [name] Other Clinician: Betha Loa Referring : Treating /Extender: [name] Weeks in Treatment: 49 Wound Status Wound Number: 15 Primary T be determined o Etiology: Wound Location: Left Lower Leg Wound Open Wounding Event: Gradually Appeared Status: Date Acquired: 06/01/2023 Comorbid Cataracts, Arrhythmia, Coronary Artery Disease, Hypertension, Weeks Of Treatment:  0 History: Type II Diabetes, Osteoarthritis, Neuropathy Clustered Wound: No Photos [name], [name] ([id number]) [id number] Page 10 of 10 Wound Measurements Length: (cm) 2.4 Width: (cm) 0.9 Depth: (cm) 0.1 Area: (cm) 1.696 Volume: (cm) 0.17 % Reduction in Area: % Reduction in Volume: Epithelialization: None Tunneling: No Undermining: No Wound Description Classification: Full Thickness Without Exposed Suppo Exudate Amount: Medium Exudate Type: Serosanguineous Exudate Color: red, brown rt Structures Foul Odor After Cleansing: No Slough/Fibrino No Wound Bed Granulation Amount: Large (67-100%) Exposed Structure Granulation Quality: Red Fat Layer (Subcutaneous Tissue) Exposed: Yes Necrotic Amount: None Present (0%) Electronic Signature(s) Signed: 06/01/2023 4:40:48 PM By: Angelina Pih Entered By: Angelina Pih on 06/01/2023 14:08:34 -------------------------------------------------------------------------------- Vitals Details Patient Name: Date of Service: [name], [name] [name] 06/01/2023 1:45 PM Medical Record Number: [id number] Patient  Account Number: [id number] Date of Birth/Sex: Treating RN: 1946/02/12 (77 y.o. [name] Primary Care : [name] Other Clinician: Betha Loa Referring : Treating /Extender: [name] in Treatment: 49 Vital Signs Time Taken: 13:52 Temperature (F): 98 Height (in): 70 Pulse (bpm): 80 Weight (lbs): 265 Respiratory Rate (breaths/min): 18 Body Mass Index (BMI): 38 Blood Pressure (mmHg): 90/53 Reference Range: 80 - 120 mg / dl Notes pt states he is always dizzy Electronic Signature(s) Signed: 06/01/2023 4:40:48 PM By: Angelina Pih Entered By: Angelina Pih on 06/01/2023 13:53:43

## 2023-06-07 ENCOUNTER — Encounter: Payer: Medicare HMO | Admitting: Physician Assistant

## 2023-06-07 DIAGNOSIS — E11621 Type 2 diabetes mellitus with foot ulcer: Secondary | ICD-10-CM | POA: Diagnosis not present

## 2023-06-07 NOTE — Progress Notes (Addendum)
ORMOND, SCHERER ([id number]) [id number] Page 1 of 14 Visit Report for 06/07/2023 Chief Complaint Document Details Patient Name: Date of Service: [name], [name] J. 06/07/2023 1:45 PM Medical Record Number: [id number] Patient Account Number: [id number] Date of Birth/Sex: Treating RN: 1946/07/05 (77 y.o. [name]) [name] Primary Care Provider: [name] Other Clinician: Betha Loa Referring Provider: Treating Provider/Extender: [name] in Treatment: 50 Information Obtained from: Patient Chief Complaint Left heel ulcer Electronic Signature(s) Signed: 06/07/2023 1:49:49 PM By: [name] Derry PA-C Entered By: [name] Derry on 06/07/2023 10:49:49 -------------------------------------------------------------------------------- Debridement Details Patient Name: Date of Service: [name], [name] [name] 06/07/2023 1:45 PM Medical Record Number: [id number] Patient Account Number: [id number] Date of Birth/Sex: Treating RN: 12/05/1945 (77 y.o. [name]) [name] Primary Care Provider: [name] Other Clinician: Betha Loa Referring Provider: Treating Provider/Extender: [name] in Treatment: 50 Debridement Performed for Assessment: Wound #10 Left Calcaneus Performed By: Physician [name] Derry, PA-C Debridement Type: Debridement Severity of Tissue Pre Debridement: Limited to breakdown of skin Level of Consciousness (Pre-procedure): Awake and Alert Pre-procedure Verification/Time Out Yes - 14:24 Taken: Start Time: 14:24 Percent of Wound Bed Debrided: 100% T Area Debrided (cm): otal 0.44 Tissue and other material debrided: Viable, Non-Viable, Callus, Slough, Subcutaneous, Slough Level: Skin/Subcutaneous Tissue Debridement Description: Excisional Instrument: Curette Bleeding: Minimum Hemostasis Achieved: Pressure Response to Treatment: Procedure was tolerated well Level of Consciousness (Post- Awake  and Alert procedure): OMNI, HELLMICH ([id number]) [id number] Page 2 of 14 Post Debridement Measurements of Total Wound Length: (cm) 0.8 Width: (cm) 0.7 Depth: (cm) 0.1 Volume: (cm) 0.044 Character of Wound/Ulcer Post Debridement: Stable Severity of Tissue Post Debridement: Fat layer exposed Post Procedure Diagnosis Same as Pre-procedure Electronic Signature(s) Signed: 06/07/2023 4:39:35 PM By: Betha Loa Signed: 06/07/2023 5:35:17 PM By: [name] Derry PA-C Signed: 06/18/2023 11:59:03 AM By: [name] RN Entered By: Betha Loa on 06/07/2023 11:26:15 -------------------------------------------------------------------------------- HPI Details Patient Name: Date of Service: [name], [name] [name] 06/07/2023 1:45 PM Medical Record Number: [id number] Patient Account Number: [id number] Date of Birth/Sex: Treating RN: Apr 19, 1946 (77 y.o. [name] Primary Care Provider: [name] Other Clinician: Betha Loa Referring Provider: Treating Provider/Extender: [name] in Treatment: 50 History of Present Illness HPI Description: 09/24/2020 on evaluation today patient presents today for a heel ulcer that he tells me has been present for about 2 years. He has been seeing podiatry and they have been attempting to manage this including what sounds to be a total contact cast, Unna boot, and just standard dressings otherwise as well. Most recently has been using triple antibiotic ointment. With that being said unfortunately despite everything he really has not had any significant improvement. He tells me that he cannot even really remember exactly how this began but he presumed it may have rubbed on his shoes or something of that nature. 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 He tells me that this does seem to help. Fortunately there is no signs of active infection at this time. His most recent hemoglobin A1c was 8.1 though he knows this was this year he cannot tell me the exact time. His fluid pills currently to help with some of  the lower extremity edema although he does obviously have signs of venous stasis/lymphedema. Currently there is no evidence of active infection. No fevers, chills, nausea, vomiting, or diarrhea. Patient has had fairly recent ABIs which were performed on 07/19/2020 and revealed that he has normal findings in both the ankle and toe locations bilaterally. His ABI on the right was 1.09 on the left was 1.08 with a TBI on the right of 0.88 and on the left of 0.94. Triphasic flow was noted throughout. 10/08/2020 on evaluation today patient appears to be doing pretty well in regard to his left heel currently in fact this is doing a great job and seems to be healing quite nicely. Unfortunately on his right leg he had a pile of wood that actually fell on him injuring his right leg this is somewhat erythematous has me concerned little bit about cellulitis though there is not really a good area to culture at this point. 10/24/2020 upon evaluation today patient appears to be doing well with regard to his heel ulcer. He is showing signs of improvement which is great news. His right leg is completely healed. Overall I feel like he is doing excellent and there is no signs of infection. 11/07/2020 upon evaluation today patient appears to be doing well with regard to his heel ulcer. He tells me that last week when he was unable to come in his wife actually thought that the wound was very close to closing if not closed. Then it began to "reopen again". I really feel like what may have happened as the collagen may have dried over the wound bed  and that because that misunderstanding with thinking that the wound was healing. With that being said I did not see it last week I do not know that for certain. Either way I feel like he is doing great today I see no signs of infection at this point. 11/26/2020 upon inspection today patient appears to be doing decently well in regard to his heel ulcer. He has been tolerating the dressing changes without complication. Fortunately there is no sign of active infection at this time. No fevers, chills, nausea, vomiting, or diarrhea. 12/10/2020 upon evaluation today patient appears to be doing fairly well in regard to the wound on his heel as well as what appears to be a new wound of the left first metatarsal head plantar aspect. This seems to be an area that was callus that has split as the patient tells me has been trying to walk on his toes more has probably where this came from. With that being said there does not appear to be signs of active infection which is great news. 12/17/2020 upon evaluation today patient actually appears to be making good progress currently. Fortunately there is no evidence of active infection at this time. Overall I feel like he is very close to complete closure. 12/26/2020 upon evaluation today patient appears to be doing excellent in regard to his wounds. In fact I am not certain that these are not even completely healed on initial inspection. Overall I am very pleased with where things stand today. Good news is that they are healed he is actually get ready to go out of ERAGON, DREIBELBIS ([id number]) [id number] Page 3 of 75 town and that will be helpful as well as he will be a full part of the time. Readmission: 02/04/2021 upon evaluation today patient appears to be doing well at this point in regard to his left heel that I previously  saw him for. Unfortunately he is having issues with his right lower extremity. He has significant wounds at this point he  also has erythema noted there is definite signs of cellulitis which is unfortunate. With that being said I think we do need to address this sooner rather than later. The good news is he did have a nice trip to the beach. He tells me that he had no issues during that time. 4/27; patient on Bactrim. Culture showed methicillin sensitive staph aureus therefore the Bactrim should be effective. 2 small areas on the right leg are healed the area on the mid aspect of the tibia almost 100% covered in a very adherent necrotic debris. We have been using silver alginate 02/20/2021 upon evaluation today patient appears to be doing well with regard to his wound. He is showing signs of improvement which is great news overall very pleased with where things stand today. No fevers, chills, nausea, vomiting, or diarrhea. 02/27/2021 upon evaluation today patient appears to be doing well with regard to his leg ulcer. He is tolerating the dressing changes and overall appears to be doing quite excellent. I am extremely pleased with where things stand and overall I think patient is making great progress. There is no sign of active infection at this time which is also great news. 03/06/2021 upon evaluation today patient appears to be doing excellent in regard to his wounds. In fact he appears to be completely healed today based on what I am seeing. This is excellent news and overall I am extremely pleased with where he stands. Overall the patient is happy to hear this as well this has been quite sometime coming. Readmission: 04/01/2021 patient unfortunately returns for readmission today. He tells me that he has been wearing his compression socks on the right leg daily and he does not really know what is going on and why his legs are doing what they are doing. We did therefore go ahead and probe deeper into exactly what has been going on with him today. Subsequently the patient tells me that when he gets up and what we would call  "first thing in the morning" are really 2 different things". He does not tend to sleep well so he tells me that he will often wake up at 3:00 in the morning. He will then potentially going into the living room to get in his chair where he may read a book for a little while and then potentially fall asleep back in his chair. He then subsequently wake up around 5:00 or so and then get up and in his words "putter around". This often will end with him proceeding at some point around 8 AM or 9 AM to putting on his compression socks. With that being said this means that anywhere from the 3:00 in the morning till roughly around 9:00 in the morning he has no compression on yet he is sitting in his recliner, walking around and up and about, and this is at least 5 to 6 hours of noncompressed time. That may be our issue here but I did not realize until we discussed this further today. Fortunately there does not appear to be any signs of active infection at this time which is great news. With that being said he has multiple wounds of the bilateral lower extremities. 04/10/2021 upon evaluation today patient appears to be doing well with regard to his legs for the most part. He does look like he had some injury where his wrap  slid down nonetheless he did some "doctoring on them". I do feel like most of the areas honestly have cleared back up which is great news. There does not appear to be any signs of active infection at this time which is also great news. No fevers, chills, nausea, vomiting, or diarrhea. 04/18/2021 upon evaluation today patient appears to be doing well with regard to his wounds in fact he appears to be completely healed which is great news. Fortunately there does not appear to be any signs of active infection which is great news. No fevers, chills, nausea, vomiting, or diarrhea. READMISSION 07/28/2021 This is a now 77 year old man we have had in this clinic for quite a bit of this year. He has been  in here with bilateral lower extremity leg wounds probably secondary to chronic venous insufficiency. He wears compression stockings. 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 He comes in an old running shoes although he says he wears better shoes at home. His history is that he noticed a blister in the right posterior heel. This open. He has been applying Neosporin to it currently the wound measures 2 x 1.4 cm. 100% slough covered. Not really offloading this in any rigorous way. Past medical history is essentially unchanged he has paroxysmal atrial fibrillation on Coumadin type 2 diabetes with a recent hemoglobin A1c of 7 on metformin. ABI in our clinic was 0.98 on the right 08/05/2021 upon evaluation today patient appears to be doing well with regard to his heel ulcer. Fortunately there does not appear to be any signs of active infection at this time. Overall I been very pleased with where things seem to be currently as far as the wound healing is concerned. Again this is first a lot of seeing him Dr. Leanord Hawking readmitted him last week. Nonetheless I think we are definitely making some progress here. 08/11/2021 upon evaluation today patient's wound on the heel actually showing signs of excellent improvement. I am actually very pleased with where things stand currently. No fevers, chills, nausea, vomiting, or diarrhea. There does not appear to be any need for sharp debridement today either which is also great news. 08/25/2021 upon evaluation today patient appears to be doing well with regard to his heel ulcer. This is actually looking significantly improved compared to last time I saw him. Fortunately there does not appear to be any evidence of active infection at this time. No fevers, chills, nausea, vomiting, or diarrhea. 09/01/2021 upon evaluation today patient appears to be doing decently well in regard to his wounds. Fortunately there does not appear to be  any signs of active infection at this time which is great news and overall very pleased in that regard. I do not see any evidence of active infection systemically which is great news as well. No fevers, chills, nausea, vomiting, or diarrhea. I think that the heel is making excellent progress. 09/15/2021 upon evaluation today patient's heel unfortunately is significantly worse compared to what it was previous. I do believe that at this time he would benefit from switching back to something a little bit more offloading from the cushion shoe he has right now this is more of a heel protector what I really need is a Prevalon offloading boot which I think is good to do a lot better for him than just the small heel protector. 09/23/2021 upon evaluation today patient appears to be doing a little better in regards to last week's visit. Overall I think  that he is making good progress which is great news and there does not appear to be any evidence of active infection at this time. No fevers, chills, nausea, vomiting, or diarrhea. 09/30/2021 upon evaluation patient's heel is actually showing signs of good improvement which is great news. Fortunately there does not appear to be any evidence of active infection locally nor systemically at this point. No fevers, chills, nausea, vomiting, or diarrhea. 10/07/2021 upon evaluation today patient appears to be doing well currently in regard to his heel ulcer. He has been tolerating the dressing changes without complication. Fortunately I do not see any signs of active infection at this time which is great news. No fevers, chills, nausea, vomiting, or diarrhea. 12/27; wound is measuring smaller. We have been using silver alginate 10/28/2021 upon evaluation today patient appears to be doing well currently in regard to his heel ulcer. I am actually very pleased with where things stand and I think he is making excellent progress. Fortunately I do not see any signs of active  infection locally nor systemically at this point which is great news. Nonetheless I do believe that the patient would benefit from a new offloading shoe and has been using it Velcro is no longer functioning properly and he tells me that he almost tripped and fell because of that today. Obviously I do not want him following that would be very bad. [name], [name] ([id number]) [id number] Page 4 of 14 11/11/2021 upon evaluation today patient appears to be doing excellent in regard to his wound. In fact this appears to be completely healed based on what I see currently. I do not see any signs of anything open or draining and I did double check just by clearing away some of the callus around the edges of the wound to ensure that there was nothing still open or hiding underneath. Once this was cleared away it appeared that the patient was doing significantly better at this time which is great news and there was nothing actually open. Readmission: 06-19-2022 upon evaluation today patient appears to be doing well currently in regard to his heel ulcer all things considered. He unfortunately is having a bit of breakdown in general as far as the heel is concerned not nearly as bad as what we noted last time he was here in the clinic. The good news is I do think that this should hopefully heal much more rapidly than what we noted last time. His past medical history has not changed he is on Coumadin. 07-03-2022 upon evaluation today patient appears to be doing better in regard to his heel. We will start to see some improvement here which is good news. Fortunately I do not see any evidence of infection locally or systemically at this time which is great news. No fevers, chills, nausea, vomiting, or diarrhea. 07-10-2022 upon evaluation today patient appears to be doing well currently in regard to his wound from the callus standpoint around the edges. Unfortunately from an actual wound bed  standpoint he has some deep tissue injury noted at this point. Again I am not sure exactly what is going on that is causing this pressure to the region but he is definitely gotten pressure that is occurring and causing this to not heal as effectively as what we would like to see. I discussed with him today that he may need to at night when he sleeping use a pillow up underneath his calf in order to prevent the heel from touching the bed  at all. I also think that it may be beneficial for him to monitor throughout his day and make sure there is no other time when he is getting the pressure to the heel. 07-23-2022 upon evaluation today patient appears to be doing poorly currently in regard to his heel ulcer. He has been tolerating the dressing changes without complication. Unfortunately I feel like he may be infected based on what I am seeing. 07-28-2022 I did review patient's culture results which showed positive for Proteus as well as Staphylococcus both of which would be treated with the Bactrim DS that I gave him at the last visit. This is good news and hopefully means that we should be able to apply the cast today as long as the wound itself does not appear to be doing poorly. Patient's wound bed actually showed signs of doing quite well and I am very pleased with where things stand and I do not see any signs of worsening overall which is great news. 08-04-2022 upon evaluation today patient appears to be doing well currently in regard to his heel ulcer. I am actually very pleased with where things stand and I do think this is looking significantly better. With regard to his right shin that is also showing signs of improvement which is great news. 08-10-2022 upon evaluation today patient's wound on the heel actually appears to be doing significantly better. In regard to the right anterior shin this is showing signs of healing it might even be completely healed but I still get a monitor I cannot get  anything to fill away but also could not see where there was anything actually draining at this point. I am tending to think healed but I want a monitor 1 more week before I call it for sure. 08-17-2022 upon evaluation today patient appears to be doing well currently in regard to his heel. Fortunately he is tolerating the dressing changes without complication. I do not see any signs of infection and overall I think he is making good progress here. We did get approval for the Apligraf but I think he had a $295 co-pay that would have to be paid per application. With that being said as good as he is doing right now I do not think that is necessary but is still an option depending on how things progress if we need to speed this up quite a bit. 08/24/2022; this patient has a wound on his left heel in the setting of type 2 diabetes. We have been using Prisma. He has a heel offloading boot 08-31-2022 upon evaluation today patient appears to be doing poorly in regard to his heel ulcer which is still showing signs of bruising I am just not as happy as I was when I saw him 2 weeks ago with this. He saw Dr. Leanord Hawking last week. Also did not look good at that point apparently. Nonetheless I think that the patient is still continuing to have some counterpressure getting to the wound bed unfortunately. 09-07-2022 upon evaluation today patient appears to be doing well currently in regard to his heel ulcer. He has been tolerating the dressing changes without complication. Fortunately there does not appear to be any signs of active infection locally or systemically at this time. Fortunately I do not see any evidence of active infection at this time. 09-15-2022 upon evaluation today patient appears to be doing well currently in regard to his legs which in fact appear to be completely healed this is great news. With  that being said his heel does have some erythema and warmth as well as drainage I am concerned about  cellulitis at this location. 09-21-2022 upon evaluation today patient's wound is actually showing signs of being a little bit smaller but still we are not doing nearly as well as what I would like to see. Fortunately I do not see any signs of infection at this time which is great news and overall I am extremely pleased in that regard. With that being said I do think that he needs to continue to elevate his leg although the edema is under great control with a compression wrap I think switching to a different dressing topically may be beneficial. My suggestion is to switch him over to a Iodoflex dressing. 09-28-2022 upon evaluation today patient appears to be doing well with regard to his heel ulcer. This is actually showing signs of improvement which is great news and overall I am extremely pleased with where we stand today. 10-06-2022 upon evaluation today patient actually appears to be making excellent progress at this point. Fortunately there does not appear to be any signs of active infection locally nor systemically which is great news and overall I am extremely pleased with where we stand. I do feel like he is actually making some pretty good progress here as we switch to the wrap along with the Iodoflex. 12/26; patient has a wound on the tip of his left heel. This is not a weightbearing surface. He is using a heel offloading boot and carefully offloading this at other times during the day. He is using Iodoflex and Zetuvitunder 3 layer compression 10-22-2022 upon evaluation today patient appears to be doing well currently in regard to his wound. He has been tolerating the dressing changes without complication. Fortunately there does not appear to be any signs of active infection locally nor systemically at this time. No fevers, chills, nausea, vomiting, or diarrhea. 08-30-2023 upon evaluation today patient appears to be doing well currently in regard to his heel ulcer which is showing signs of good  improvement. Fortunately I do not see any evidence of infection locally nor systemically which is great news and overall I am extremely pleased with where we stand. No fevers, chills, nausea, vomiting, or diarrhea. Unfortunately he does have a wound on the side of his fifth toe where I think this did rub on the shoe. 11-05-2022 upon evaluation today patient still still showing some signs of pressure at this point and there may have even been some fluid collection at this time. Fortunately I do not see any evidence of active infection locally nor systemically which is great news and overall I am extremely pleased with where we stand. No fevers, chills, nausea, vomiting, or diarrhea. 1/25; the patient's area on the dorsal aspect of the left fifth toe is healed. We have been using Iodoflex to the area on the left heel. The lateral wound is slightly down in length. 11-19-2022 upon evaluation today patient appears to be doing well currently in regard to his wound. He has been tolerating the dressing changes without complication. Fortunately there does not appear to be any signs of infection we been using Iodoflex up to this point. 11-26-2022 upon evaluation today patient appears to be doing poorly currently in regard to his wound. He has been tolerating the dressing changes with the Iodoflex without complication. With that being said he does not have any signs of active infection systemically though locally I do feel like there is some  evidence of infection here. 12-03-2022 upon evaluation today patient appears to be doing well currently in regard to his wound this is better with the Bactrim compared to last week still there was some significant slough and biofilm buildup which is going require sharp debridement at this point. Fortunately I do not see any signs of active infection [name], [name] ([id number]) [id number] Page 5 of 14 locally nor systemically which is great  news. 12-11-2022 upon evaluation today patient actually appears to be doing significantly better at this time in regard to his heel. I am actually very pleased with where things stand currently. There is no signs of active infection locally or systemically at this point. 12-17-2022 upon evaluation today patient appears to be doing better as far as the overall appearance of his wound. I am actually very pleased in that regard. Fortunately there does not appear to be any signs of active infection locally nor systemically at this time. I do feel like that the patient is showing signs of excellent improvement in general. Nonetheless he needs something better for compression I think he should go ahead and see about getting compression socks. 3/7; tip of the left heel measurements are smaller he has been using Hydrofera Blue 01-07-2023 upon evaluation today patient appears to be doing well currently in regard to his wound. This has not been debrided since I last saw him and it definitely shows that definitely needs some debridement today. Fortunately I do not see any evidence of active infection locally nor systemically which is great news. 01-14-2023 upon evaluation today patient appears to be doing well currently although he does have some erythema and warmth to the heel I feel like he may have developed an infection yet again. This is something that is been ongoing issue we have been trying to manage his foot best we could. With that being said I do think he may need to go back on the Bactrim this has done well for him in the past. 01-21-2023 upon evaluation today patient appears to be doing better in regard to his heel. He is doing very well with the antibiotics and that is made a big difference for him based on what I am seeing I am very pleased with where we stand today. He is obviously doing extremely well which is great news. 01-28-2023 upon evaluation today patient appears to be doing well currently in  regard to his wound. This is actually showing signs of excellent improvement I am actually very pleased with where we stand and I do believe that he is making good progress here. Fortunately I do not see any evidence of active infection locally nor systemically which is great news. No fevers, chills, nausea, vomiting, or diarrhea. 02-04-2023 upon evaluation today patient's wound is actually showed signs of excellent improvement I am actually very pleased with where we stand. I do not see any signs of active infection locally nor systemically which is great news and in general I do believe that we are moving in the right direction here. No fevers, chills, nausea, vomiting, or diarrhea. 02-11-2023 upon evaluation patient's wound actually showing signs of excellent improvement I am actually very pleased with where we stand I think that his pain is better there is no signs of infection we will continue to use the gentamicin and overall we are making excellent progress. 02-18-2023 upon evaluation today patient appears to be doing well currently in regard to his wound. He has been tolerating the dressing changes without complication.  Fortunately I do not see any signs of active infection locally nor systemically which is great news. 02-25-2023 upon evaluation today patient's heel actually showed signs of excellent improvement. I am actually very pleased with where we stand today and I feel like that he is making really good progress. Fortunately I do not see any signs of active infection which is good news. No fevers, chills, nausea, vomiting, or diarrhea. 03-04-2023 upon evaluation today patient appears to be doing pretty well currently in regard to his heel. He has been tolerating the dressing changes without complication. Fortunately there does not appear to be any signs of active infection locally nor systemically which is great news. No fevers, chills, nausea, vomiting, or diarrhea. 03-11-2023 upon evaluation  today patient appears to be doing poorly in regard to his heel ulcer. He tells me he has been having to sleep on his back to having trouble breathing therefore I think he has been having some issues here as far as the heel is concerned it looks like he has a deep tissue injury yet again at this point. Fortunately I do not see any signs of infection which is good news but at the same time I do feel like that he is doing worse than where he was previous. I do not see any signs of active infection systemically which is great news. 03-18-2023 upon evaluation today patient appears to be doing well currently in fact the heel is significantly better compared to last week. I am extremely pleased with where we are seeing I am hopeful that he will continue to show signs of improvement going forward. 03-25-2023 upon evaluation today patient appears to be doing well currently in regard to his wounds. He is actually showing signs of improvement on the heel and I am very pleased in that regard. Fortunately I do not see any signs of active infection at this time which is great news. No fevers, chills, nausea, vomiting, or diarrhea. 04-05-2023 upon evaluation today patient's wound is actually showing signs of good improvement. Actually very pleased with where things stand today. Fortunately there does not appear to be any signs of active infection locally or systemically which is great news. 04-19-2023 upon evaluation today patient's wound actually looks the best it has in quite some time. He did have a wrap on for 2 weeks which I think is caused a little bit of irritation going to the forefoot everything else looks to be doing well the wrap did slide down a little bit as well. Fortunately I do not see any signs of infection however I do think that in the future if he has any issues with not being able to get scheduled to see me he should definitely make an appointment at least for a nurse visit he is aware and in agreement  with plan going forward. 04-26-2023 upon evaluation today patient's wound is actually significantly smaller this is great news he is making good progress in the past really 5 or 6 visits we have been seeing improvement week by week I am very pleased in that regard. 05-03-2023 upon evaluation today patient appears to be doing excellent in regard to the wound on his heel. This is actually showing signs of improvement he continues to make good progress towards complete closure which is great news. 7/23; wound continues to do nicely. Smaller with healthy epithelialization. This is actually a reopening we had already healed this area once and we discussed this this visit. 05-18-2023 upon evaluation today patient's  wound actually showing signs of improvement. Fortunately I do not see any signs of active infection locally or systemically which is great news. No fevers, chills, nausea, vomiting, or diarrhea. 05-24-2023 upon evaluation today patient's wound on the heel appears to be doing quite well. Fortunately there does not appear to be any signs of active infection at this time which is great news. Fortunately I think that he is making excellent headway with the current treatment regimen. 06-01-2023 upon evaluation today patient appears to be doing excellent in regard to his wound on the heel this is actually showing signs of being smaller measuring better and looking like is doing quite well. Fortunately I do not see any signs of infection at this time. He does have a small wound on the leg which we can also need to address this looks like it was just a small blister that ruptured I think it should heal pretty quickly. 06-07-2023 upon evaluation today patient appears to be doing well currently in regard to his wound. He has been tolerating the dressing changes without complication. Fortunately there does not appear to be any signs of active infection locally or systemically at this time. No fevers, chills,  nausea, vomiting, or diarrhea. Electronic Signature(s) Signed: 06/07/2023 2:34:19 PM By: Ricarda Frame, [name] Lunger ([id number]) [id number] Page 6 of 14 Entered By: [name] Derry on 06/07/2023 11:34:18 -------------------------------------------------------------------------------- Physical Exam Details Patient Name: Date of Service: [name] [facility] J. 06/07/2023 1:45 PM Medical Record Number: [id number] Patient Account Number: [id number] Date of Birth/Sex: Treating RN: 01/14/1946 (76 y.o. [name]) [name] Primary Care Provider: [name] Other Clinician: Betha Loa Referring Provider: Treating Provider/Extender: Hermine Messick Weeks in Treatment: 50 Constitutional Well-nourished and well-hydrated in no acute distress. Respiratory normal breathing without difficulty. Psychiatric this patient is able to make decisions and demonstrates good insight into disease process. Alert and Oriented x 3. pleasant and cooperative. Notes Upon inspection patient's wound bed actually showed signs of good granulation epithelization at this point. Fortunately I do not see any signs of worsening at this point I did perform debridement clearway necrotic debris he tolerated that today without complication postdebridement the wound bed is significantly improved. Electronic Signature(s) Signed: 06/07/2023 2:46:46 PM By: [name] Derry PA-C Entered By: [name] Derry on 06/07/2023 11:46:46 -------------------------------------------------------------------------------- Physician Orders Details Patient Name: Date of Service: [name], [name] [name] 06/07/2023 1:45 PM Medical Record Number: [id number] Patient Account Number: [id number] Date of Birth/Sex: Treating RN: March 25, 1946 (76 y.o. 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 Bathing/ Shower/ Hygiene May shower; gently cleanse wound with antibacterial soap, rinse and pat dry prior to dressing wounds Off-Loading Open toe surgical shoe Wound Treatment Wound #10 - Calcaneus Wound Laterality: Left Topical: Mupirocin Ointment 1 x Per Week/30 Days Discharge Instructions: Apply as directed by provider. Prim Dressing: Hydrofera Blue Ready Transfer Foam, 2.5x2.5 (in/in) (Dispense As Written) 1 x Per Week/30 Days ary Discharge Instructions: Apply Hydrofera Blue Ready to wound bed as directed Secondary Dressing:  ABD Pad 5x9 (in/in) 1 x Per Week/30 Days Discharge Instructions: Cover with ABD pad Compression Wrap: Urgo K2 Lite, two layer compression system, regular 1 x Per Week/30 Days Wound #15 - Lower Leg Wound Laterality: Left Cleanser: Soap and Water 1 x Per Week/15 Days Discharge Instructions: Gently cleanse wound with antibacterial soap, rinse and pat dry prior to dressing wounds Prim Dressing: Silvercel Small 2x2 (in/in) 1 x Per Week/15 Days ary Discharge Instructions: Apply Silvercel Small 2x2 (in/in) as instructed Secondary Dressing: ABD Pad 5x9 (in/in) 1 x Per Week/15 Days Discharge Instructions: Cover with ABD pad Compression Wrap: Urgo K2 Lite, two layer compression system, regular 1 x Per Week/15 Days Electronic Signature(s) Signed: 06/07/2023 4:39:35 PM By: Betha Loa Signed: 06/07/2023 5:35:17 PM By: [name] Derry PA-C Entered By: Betha Loa on 06/07/2023  11:27:36 -------------------------------------------------------------------------------- Problem List Details Patient Name: Date of Service: [name], [name] [name] 06/07/2023 1:45 PM Medical Record Number: [id number] Patient Account Number: [id number] Date of Birth/Sex: Treating RN: Jan 13, 1946 (76 y.o. [name] Primary Care Provider: [name] Other Clinician: Betha Loa Referring Provider: Treating Provider/Extender: [name] in Treatment: 50 Active Problems ICD-10 Encounter Code Description Active Date MDM Diagnosis E11.621 Type 2 diabetes mellitus with foot ulcer 06/19/2022 No Yes [name], [name] ([id number]) [id number] Page 8 of 14 [phone] Non-pressure chronic ulcer of other part of left foot with fat layer exposed 06/19/2022 No Yes E11.42 Type 2 diabetes mellitus with diabetic polyneuropathy 06/19/2022 No Yes Inactive Problems Resolved Problems Electronic Signature(s) Signed: 06/07/2023 4:39:35 PM By: Betha Loa Signed: 06/07/2023 5:35:17 PM By: [name] Derry PA-C Previous Signature: 06/07/2023 1:49:44 PM Version By: [name] Derry PA-C Entered By: Betha Loa on 06/07/2023 11:46:09 -------------------------------------------------------------------------------- Progress Note Details Patient Name: Date of Service: [name], [name] [name] 06/07/2023 1:45 PM Medical Record Number: [id number] Patient Account Number: [id number] Date of Birth/Sex: Treating RN: 1946-06-07 (76 y.o. [name] Primary Care Provider: [name] Other Clinician: Betha Loa Referring Provider: Treating Provider/Extender: [name] in Treatment: 50 Subjective Chief Complaint Information obtained from Patient Left heel ulcer History of Present Illness (HPI) 09/24/2020 on evaluation today patient presents today for a heel ulcer that he tells me has been present for about 2 years. He has been seeing  podiatry and they have been attempting to manage this including what sounds to be a total contact cast, Unna boot, and just standard dressings otherwise as well. Most recently has been using triple antibiotic ointment. With that being said unfortunately despite everything he really has not had any significant improvement. He tells me that he cannot even really remember exactly how this began but he presumed it may have rubbed on his shoes or something of that nature. 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 He tells me that this does seem to help. Fortunately there is no signs of active infection at this time. His most recent hemoglobin A1c was 8.1 though he knows this was this year he cannot tell me the exact time. His fluid pills currently to help with some of the lower extremity edema although he does obviously have signs of venous stasis/lymphedema. Currently there is no evidence of active infection. No fevers,  chills, nausea, vomiting, or diarrhea. Patient has had fairly recent ABIs which were performed on 07/19/2020 and revealed that he has normal findings in both the ankle and toe locations bilaterally. His ABI on the right was 1.09 on the left was 1.08 with a TBI on the right of 0.88 and on the left of 0.94. Triphasic flow was noted throughout. 10/08/2020 on evaluation today patient appears to be doing pretty well in regard to his left heel currently in fact this is doing a great job and seems to be healing quite nicely. Unfortunately on his right leg he had a pile of wood that actually fell on him injuring his right leg this is somewhat erythematous has me concerned little bit about cellulitis though there is not  really a good area to culture at this point. 10/24/2020 upon evaluation today patient appears to be doing well with regard to his heel ulcer. He is showing signs of improvement which is great news. His right leg is completely healed. Overall I feel like he is doing excellent and there is no signs of infection. 11/07/2020 upon evaluation today patient appears to be doing well with regard to his heel ulcer. He tells me that last week when he was unable to come in his wife actually thought that the wound was very close to closing if not closed. Then it began to "reopen again". I really feel like what may have happened as the collagen may have dried over the wound bed and that because that misunderstanding with thinking that the wound was healing. With that being said I did not see it last week I do not know that for certain. Either way I feel like he is doing great today I see no signs of infection at this point. 11/26/2020 upon inspection today patient appears to be doing decently well in regard to his heel ulcer. He has been tolerating the dressing changes without complication. Fortunately there is no sign of active infection at this time. No fevers, chills, nausea, vomiting, or diarrhea. 12/10/2020 upon evaluation today patient appears to be doing fairly well in regard to the wound on his heel as well as what appears to be a new wound of the left first metatarsal head plantar aspect. This seems to be an area that was callus that has split as the patient tells me has been trying to walk on his toes more has probably where this came from. With that being said there does not appear to be signs of active infection which is great news. 12/17/2020 upon evaluation today patient actually appears to be making good progress currently. Fortunately there is no evidence of active infection at this time. [name], [name] ([id number]) [id number] Page 9 of 14 Overall I feel like he is very close  to complete closure. 12/26/2020 upon evaluation today patient appears to be doing excellent in regard to his wounds. In fact I am not certain that these are not even completely healed on initial inspection. Overall I am very pleased with where things stand today. Good news is that they are healed he is actually get ready to go out of town and that will be helpful as well as he will be a full part of the time. Readmission: 02/04/2021 upon evaluation today patient appears to be doing well at this point in regard to his left heel that I previously saw him for. Unfortunately he is having issues with his right lower extremity. He has significant wounds at this point he also  has erythema noted there is definite signs of cellulitis which is unfortunate. With that being said I think we do need to address this sooner rather than later. The good news is he did have a nice trip to the beach. He tells me that he had no issues during that time. 4/27; patient on Bactrim. Culture showed methicillin sensitive staph aureus therefore the Bactrim should be effective. 2 small areas on the right leg are healed the area on the mid aspect of the tibia almost 100% covered in a very adherent necrotic debris. We have been using silver alginate 02/20/2021 upon evaluation today patient appears to be doing well with regard to his wound. He is showing signs of improvement which is great news overall very pleased with where things stand today. No fevers, chills, nausea, vomiting, or diarrhea. 02/27/2021 upon evaluation today patient appears to be doing well with regard to his leg ulcer. He is tolerating the dressing changes and overall appears to be doing quite excellent. I am extremely pleased with where things stand and overall I think patient is making great progress. There is no sign of active infection at this time which is also great news. 03/06/2021 upon evaluation today patient appears to be doing excellent in regard to his  wounds. In fact he appears to be completely healed today based on what I am seeing. This is excellent news and overall I am extremely pleased with where he stands. Overall the patient is happy to hear this as well this has been quite sometime coming. Readmission: 04/01/2021 patient unfortunately returns for readmission today. He tells me that he has been wearing his compression socks on the right leg daily and he does not really know what is going on and why his legs are doing what they are doing. We did therefore go ahead and probe deeper into exactly what has been going on with him today. Subsequently the patient tells me that when he gets up and what we would call "first thing in the morning" are really 2 different things". He does not tend to sleep well so he tells me that he will often wake up at 3:00 in the morning. He will then potentially going into the living room to get in his chair where he may read a book for a little while and then potentially fall asleep back in his chair. He then subsequently wake up around 5:00 or so and then get up and in his words "putter around". This often will end with him proceeding at some point around 8 AM or 9 AM to putting on his compression socks. With that being said this means that anywhere from the 3:00 in the morning till roughly around 9:00 in the morning he has no compression on yet he is sitting in his recliner, walking around and up and about, and this is at least 5 to 6 hours of noncompressed time. That may be our issue here but I did not realize until we discussed this further today. Fortunately there does not appear to be any signs of active infection at this time which is great news. With that being said he has multiple wounds of the bilateral lower extremities. 04/10/2021 upon evaluation today patient appears to be doing well with regard to his legs for the most part. He does look like he had some injury where his wrap slid down nonetheless he did  some "doctoring on them". I do feel like most of the areas honestly have cleared back up  which is great news. There does not appear to be any signs of active infection at this time which is also great news. No fevers, chills, nausea, vomiting, or diarrhea. 04/18/2021 upon evaluation today patient appears to be doing well with regard to his wounds in fact he appears to be completely healed which is great news. Fortunately there does not appear to be any signs of active infection which is great news. No fevers, chills, nausea, vomiting, or diarrhea. READMISSION 07/28/2021 This is a now 76 year old man we have had in this clinic for quite a bit of this year. 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 He wears compression stockings. 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 He comes in an old running shoes although he says he wears better shoes at home. His history is that he noticed a blister in the right posterior heel. This open. He has been applying Neosporin to it currently the wound measures 2 x 1.4 cm. 100% slough covered. Not really offloading this in any rigorous way. Past medical history is essentially unchanged he has paroxysmal atrial fibrillation on Coumadin type 2 diabetes with a recent hemoglobin A1c of 7 on metformin. ABI in our clinic was 0.98 on the right 08/05/2021 upon evaluation today patient appears to be doing well with regard to his heel ulcer. Fortunately there does not appear to be any signs of active infection at this time. Overall I been very pleased with where things seem to be currently as far as the wound healing is concerned. Again this is first a lot of seeing him Dr. Leanord Hawking readmitted him last week. Nonetheless I think we are definitely making some progress here. 08/11/2021 upon evaluation today patient's wound on the heel actually showing signs of excellent  improvement. I am actually very pleased with where things stand currently. No fevers, chills, nausea, vomiting, or diarrhea. There does not appear to be any need for sharp debridement today either which is also great news. 08/25/2021 upon evaluation today patient appears to be doing well with regard to his heel ulcer. This is actually looking significantly improved compared to last time I saw him. Fortunately there does not appear to be any evidence of active infection at this time. No fevers, chills, nausea, vomiting, or diarrhea. 09/01/2021 upon evaluation today patient appears to be doing decently well in regard to his wounds. Fortunately there does not appear to be any signs of active infection at this time which is great news and overall very pleased in that regard. I do not see any evidence of active infection systemically which is great news as well. No fevers, chills, nausea, vomiting, or diarrhea. I think that the heel is making excellent progress. 09/15/2021 upon evaluation today patient's heel unfortunately is significantly worse compared to what it was previous. I do believe that at this time he would benefit from switching back to something a little bit more offloading from the cushion shoe he has right now this is more of a heel protector what I really need is a Prevalon offloading boot which I think is good to do a lot better for him than just the small heel protector. 09/23/2021 upon evaluation today patient appears to be doing a little better in regards to last week's visit. Overall I think that he is making good progress which is great news and there does not appear to be any evidence of active infection at  this time. No fevers, chills, nausea, vomiting, or diarrhea. 09/30/2021 upon evaluation patient's heel is actually showing signs of good improvement which is great news. Fortunately there does not appear to be any evidence of active infection locally nor systemically at this point.  No fevers, chills, nausea, vomiting, or diarrhea. 10/07/2021 upon evaluation today patient appears to be doing well currently in regard to his heel ulcer. He has been tolerating the dressing changes without complication. Fortunately I do not see any signs of active infection at this time which is great news. No fevers, chills, nausea, vomiting, or diarrhea. 12/27; wound is measuring smaller. We have been using silver alginate 10/28/2021 upon evaluation today patient appears to be doing well currently in regard to his heel ulcer. I am actually very pleased with where things stand and I [name], [name] ([id number]) [id number] Page 10 of 14 think he is making excellent progress. Fortunately I do not see any signs of active infection locally nor systemically at this point which is great news. Nonetheless I do believe that the patient would benefit from a new offloading shoe and has been using it Velcro is no longer functioning properly and he tells me that he almost tripped and fell because of that today. Obviously I do not want him following that would be very bad. 11/11/2021 upon evaluation today patient appears to be doing excellent in regard to his wound. In fact this appears to be completely healed based on what I see currently. I do not see any signs of anything open or draining and I did double check just by clearing away some of the callus around the edges of the wound to ensure that there was nothing still open or hiding underneath. Once this was cleared away it appeared that the patient was doing significantly better at this time which is great news and there was nothing actually open. Readmission: 06-19-2022 upon evaluation today patient appears to be doing well currently in regard to his heel ulcer all things considered. He unfortunately is having a bit of breakdown in general as far as the heel is concerned not nearly as bad as what we noted last time he was here in  the clinic. The good news is I do think that this should hopefully heal much more rapidly than what we noted last time. His past medical history has not changed he is on Coumadin. 07-03-2022 upon evaluation today patient appears to be doing better in regard to his heel. We will start to see some improvement here which is good news. Fortunately I do not see any evidence of infection locally or systemically at this time which is great news. No fevers, chills, nausea, vomiting, or diarrhea. 07-10-2022 upon evaluation today patient appears to be doing well currently in regard to his wound from the callus standpoint around the edges. Unfortunately from an actual wound bed standpoint he has some deep tissue injury noted at this point. Again I am not sure exactly what is going on that is causing this pressure to the region but he is definitely gotten pressure that is occurring and causing this to not heal as effectively as what we would like to see. I discussed with him today that he may need to at night when he sleeping use a pillow up underneath his calf in order to prevent the heel from touching the bed at all. I also think that it may be beneficial for him to monitor throughout his day and make sure there is no  other time when he is getting the pressure to the heel. 07-23-2022 upon evaluation today patient appears to be doing poorly currently in regard to his heel ulcer. He has been tolerating the dressing changes without complication. Unfortunately I feel like he may be infected based on what I am seeing. 07-28-2022 I did review patient's culture results which showed positive for Proteus as well as Staphylococcus both of which would be treated with the Bactrim DS that I gave him at the last visit. This is good news and hopefully means that we should be able to apply the cast today as long as the wound itself does not appear to be doing poorly. Patient's wound bed actually showed signs of doing quite well and  I am very pleased with where things stand and I do not see any signs of worsening overall which is great news. 08-04-2022 upon evaluation today patient appears to be doing well currently in regard to his heel ulcer. I am actually very pleased with where things stand and I do think this is looking significantly better. With regard to his right shin that is also showing signs of improvement which is great news. 08-10-2022 upon evaluation today patient's wound on the heel actually appears to be doing significantly better. In regard to the right anterior shin this is showing signs of healing it might even be completely healed but I still get a monitor I cannot get anything to fill away but also could not see where there was anything actually draining at this point. I am tending to think healed but I want a monitor 1 more week before I call it for sure. 08-17-2022 upon evaluation today patient appears to be doing well currently in regard to his heel. Fortunately he is tolerating the dressing changes without complication. I do not see any signs of infection and overall I think he is making good progress here. We did get approval for the Apligraf but I think he had a $295 co-pay that would have to be paid per application. With that being said as good as he is doing right now I do not think that is necessary but is still an option depending on how things progress if we need to speed this up quite a bit. 08/24/2022; this patient has a wound on his left heel in the setting of type 2 diabetes. We have been using Prisma. He has a heel offloading boot 08-31-2022 upon evaluation today patient appears to be doing poorly in regard to his heel ulcer which is still showing signs of bruising I am just not as happy as I was when I saw him 2 weeks ago with this. He saw Dr. Leanord Hawking last week. Also did not look good at that point apparently. Nonetheless I think that the patient is still continuing to have some counterpressure  getting to the wound bed unfortunately. 09-07-2022 upon evaluation today patient appears to be doing well currently in regard to his heel ulcer. He has been tolerating the dressing changes without complication. Fortunately there does not appear to be any signs of active infection locally or systemically at this time. Fortunately I do not see any evidence of active infection at this time. 09-15-2022 upon evaluation today patient appears to be doing well currently in regard to his legs which in fact appear to be completely healed this is great news. With that being said his heel does have some erythema and warmth as well as drainage I am concerned about cellulitis at this  location. 09-21-2022 upon evaluation today patient's wound is actually showing signs of being a little bit smaller but still we are not doing nearly as well as what I would like to see. Fortunately I do not see any signs of infection at this time which is great news and overall I am extremely pleased in that regard. With that being said I do think that he needs to continue to elevate his leg although the edema is under great control with a compression wrap I think switching to a different dressing topically may be beneficial. My suggestion is to switch him over to a Iodoflex dressing. 09-28-2022 upon evaluation today patient appears to be doing well with regard to his heel ulcer. This is actually showing signs of improvement which is great news and overall I am extremely pleased with where we stand today. 10-06-2022 upon evaluation today patient actually appears to be making excellent progress at this point. Fortunately there does not appear to be any signs of active infection locally nor systemically which is great news and overall I am extremely pleased with where we stand. I do feel like he is actually making some pretty good progress here as we switch to the wrap along with the Iodoflex. 12/26; patient has a wound on the tip of his  left heel. This is not a weightbearing surface. He is using a heel offloading boot and carefully offloading this at other times during the day. He is using Iodoflex and Zetuvitunder 3 layer compression 10-22-2022 upon evaluation today patient appears to be doing well currently in regard to his wound. He has been tolerating the dressing changes without complication. Fortunately there does not appear to be any signs of active infection locally nor systemically at this time. No fevers, chills, nausea, vomiting, or diarrhea. 08-30-2023 upon evaluation today patient appears to be doing well currently in regard to his heel ulcer which is showing signs of good improvement. Fortunately I do not see any evidence of infection locally nor systemically which is great news and overall I am extremely pleased with where we stand. No fevers, chills, nausea, vomiting, or diarrhea. Unfortunately he does have a wound on the side of his fifth toe where I think this did rub on the shoe. 11-05-2022 upon evaluation today patient still still showing some signs of pressure at this point and there may have even been some fluid collection at this time. Fortunately I do not see any evidence of active infection locally nor systemically which is great news and overall I am extremely pleased with where we stand. No fevers, chills, nausea, vomiting, or diarrhea. 1/25; the patient's area on the dorsal aspect of the left fifth toe is healed. We have been using Iodoflex to the area on the left heel. The lateral wound is slightly down in length. 11-19-2022 upon evaluation today patient appears to be doing well currently in regard to his wound. He has been tolerating the dressing changes without complication. Fortunately there does not appear to be any signs of infection we been using Iodoflex up to this point. 11-26-2022 upon evaluation today patient appears to be doing poorly currently in regard to his wound. He has been tolerating the  dressing changes with the Iodoflex without complication. With that being said he does not have any signs of active infection systemically though locally I do feel like there is some [name], [name] ([id number]) [id number] Page 11 of 14 evidence of infection here. 12-03-2022 upon evaluation today patient appears to be doing  well currently in regard to his wound this is better with the Bactrim compared to last week still there was some significant slough and biofilm buildup which is going require sharp debridement at this point. Fortunately I do not see any signs of active infection locally nor systemically which is great news. 12-11-2022 upon evaluation today patient actually appears to be doing significantly better at this time in regard to his heel. I am actually very pleased with where things stand currently. There is no signs of active infection locally or systemically at this point. 12-17-2022 upon evaluation today patient appears to be doing better as far as the overall appearance of his wound. I am actually very pleased in that regard. Fortunately there does not appear to be any signs of active infection locally nor systemically at this time. I do feel like that the patient is showing signs of excellent improvement in general. Nonetheless he needs something better for compression I think he should go ahead and see about getting compression socks. 3/7; tip of the left heel measurements are smaller he has been using Hydrofera Blue 01-07-2023 upon evaluation today patient appears to be doing well currently in regard to his wound. This has not been debrided since I last saw him and it definitely shows that definitely needs some debridement today. Fortunately I do not see any evidence of active infection locally nor systemically which is great news. 01-14-2023 upon evaluation today patient appears to be doing well currently although he does have some erythema and warmth to  the heel I feel like he may have developed an infection yet again. This is something that is been ongoing issue we have been trying to manage his foot best we could. With that being said I do think he may need to go back on the Bactrim this has done well for him in the past. 01-21-2023 upon evaluation today patient appears to be doing better in regard to his heel. He is doing very well with the antibiotics and that is made a big difference for him based on what I am seeing I am very pleased with where we stand today. He is obviously doing extremely well which is great news. 01-28-2023 upon evaluation today patient appears to be doing well currently in regard to his wound. This is actually showing signs of excellent improvement I am actually very pleased with where we stand and I do believe that he is making good progress here. Fortunately I do not see any evidence of active infection locally nor systemically which is great news. No fevers, chills, nausea, vomiting, or diarrhea. 02-04-2023 upon evaluation today patient's wound is actually showed signs of excellent improvement I am actually very pleased with where we stand. I do not see any signs of active infection locally nor systemically which is great news and in general I do believe that we are moving in the right direction here. No fevers, chills, nausea, vomiting, or diarrhea. 02-11-2023 upon evaluation patient's wound actually showing signs of excellent improvement I am actually very pleased with where we stand I think that his pain is better there is no signs of infection we will continue to use the gentamicin and overall we are making excellent progress. 02-18-2023 upon evaluation today patient appears to be doing well currently in regard to his wound. He has been tolerating the dressing changes without complication. Fortunately I do not see any signs of active infection locally nor systemically which is great news. 02-25-2023 upon evaluation today  patient's  heel actually showed signs of excellent improvement. I am actually very pleased with where we stand today and I feel like that he is making really good progress. Fortunately I do not see any signs of active infection which is good news. No fevers, chills, nausea, vomiting, or diarrhea. 03-04-2023 upon evaluation today patient appears to be doing pretty well currently in regard to his heel. He has been tolerating the dressing changes without complication. Fortunately there does not appear to be any signs of active infection locally nor systemically which is great news. No fevers, chills, nausea, vomiting, or diarrhea. 03-11-2023 upon evaluation today patient appears to be doing poorly in regard to his heel ulcer. He tells me he has been having to sleep on his back to having trouble breathing therefore I think he has been having some issues here as far as the heel is concerned it looks like he has a deep tissue injury yet again at this point. Fortunately I do not see any signs of infection which is good news but at the same time I do feel like that he is doing worse than where he was previous. I do not see any signs of active infection systemically which is great news. 03-18-2023 upon evaluation today patient appears to be doing well currently in fact the heel is significantly better compared to last week. I am extremely pleased with where we are seeing I am hopeful that he will continue to show signs of improvement going forward. 03-25-2023 upon evaluation today patient appears to be doing well currently in regard to his wounds. He is actually showing signs of improvement on the heel and I am very pleased in that regard. Fortunately I do not see any signs of active infection at this time which is great news. No fevers, chills, nausea, vomiting, or diarrhea. 04-05-2023 upon evaluation today patient's wound is actually showing signs of good improvement. Actually very pleased with where things stand  today. Fortunately there does not appear to be any signs of active infection locally or systemically which is great news. 04-19-2023 upon evaluation today patient's wound actually looks the best it has in quite some time. He did have a wrap on for 2 weeks which I think is caused a little bit of irritation going to the forefoot everything else looks to be doing well the wrap did slide down a little bit as well. Fortunately I do not see any signs of infection however I do think that in the future if he has any issues with not being able to get scheduled to see me he should definitely make an appointment at least for a nurse visit he is aware and in agreement with plan going forward. 04-26-2023 upon evaluation today patient's wound is actually significantly smaller this is great news he is making good progress in the past really 5 or 6 visits we have been seeing improvement week by week I am very pleased in that regard. 05-03-2023 upon evaluation today patient appears to be doing excellent in regard to the wound on his heel. This is actually showing signs of improvement he continues to make good progress towards complete closure which is great news. 7/23; wound continues to do nicely. Smaller with healthy epithelialization. This is actually a reopening we had already healed this area once and we discussed this this visit. 05-18-2023 upon evaluation today patient's wound actually showing signs of improvement. Fortunately I do not see any signs of active infection locally or systemically which is great news.  No fevers, chills, nausea, vomiting, or diarrhea. 05-24-2023 upon evaluation today patient's wound on the heel appears to be doing quite well. Fortunately there does not appear to be any signs of active infection at this time which is great news. Fortunately I think that he is making excellent headway with the current treatment regimen. 06-01-2023 upon evaluation today patient appears to be doing excellent in  regard to his wound on the heel this is actually showing signs of being smaller measuring better and looking like is doing quite well. Fortunately I do not see any signs of infection at this time. He does have a small wound on the leg which we can also need to address this looks like it was just a small blister that ruptured I think it should heal pretty quickly. 06-07-2023 upon evaluation today patient appears to be doing well currently in regard to his wound. He has been tolerating the dressing changes without complication. Fortunately there does not appear to be any signs of active infection locally or systemically at this time. No fevers, chills, nausea, vomiting, or diarrhea. [name], [name] ([id number]) [id number] Page 12 of 14 Objective Constitutional Well-nourished and well-hydrated in no acute distress. Vitals Time Taken: 1:49 PM, Height: 70 in, Weight: 265 lbs, BMI: 38, Temperature: 98.7 F, Pulse: 74 bpm, Respiratory Rate: 18 breaths/min, Blood Pressure: 123/61 mmHg. Respiratory normal breathing without difficulty. Psychiatric this patient is able to make decisions and demonstrates good insight into disease process. Alert and Oriented x 3. pleasant and cooperative. General Notes: Upon inspection patient's wound bed actually showed signs of good granulation epithelization at this point. Fortunately I do not see any signs of worsening at this point I did perform debridement clearway necrotic debris he tolerated that today without complication postdebridement the wound bed is significantly improved. Integumentary (Hair, Skin) Wound #10 status is Open. Original cause of wound was Gradually Appeared. The date acquired was: 06/05/2022. The wound has been in treatment 50 weeks. The wound is located on the Left Calcaneus. The wound measures 0.8cm length x 0.7cm width x 0.1cm depth; 0.44cm^2 area and 0.044cm^3 volume. There is Fat Layer (Subcutaneous Tissue)  exposed. There is a medium amount of serosanguineous drainage noted. The wound margin is flat and intact. There is small (1-33%) pink granulation within the wound bed. There is a small (1-33%) amount of necrotic tissue within the wound bed including Adherent Slough. Wound #15 status is Open. Original cause of wound was Gradually Appeared. The date acquired was: 06/01/2023. The wound is located on the Left Lower Leg. The wound measures 0.5cm length x 0.4cm width x 0.1cm depth; 0.157cm^2 area and 0.016cm^3 volume. There is Fat Layer (Subcutaneous Tissue) exposed. There is a medium amount of serosanguineous drainage noted. There is large (67-100%) red granulation within the wound bed. There is no necrotic tissue within the wound bed. 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 There was a Excisional Skin/Subcutaneous Tissue Debridement with a total area of 0.44 sq cm performed by [name] Derry, PA-C. With the following instrument(s): Curette to remove Viable and Non-Viable tissue/material. Material removed includes Callus, Subcutaneous Tissue, and Slough. A time out was conducted at 14:24, prior to the  start of the procedure. A Minimum amount of bleeding was controlled with Pressure. The procedure was tolerated well. Post Debridement Measurements: 0.8cm length x 0.7cm width x 0.1cm depth; 0.044cm^3 volume. Character of Wound/Ulcer Post Debridement is stable. Severity of Tissue Post Debridement is: Fat layer exposed. Post procedure Diagnosis Wound #10: Same as Pre-Procedure Pre-procedure diagnosis of Wound #10 is a Diabetic Wound/Ulcer of  the Lower Extremity located on the Left Calcaneus . There was a Double Layer Compression Therapy Procedure with a pre-treatment ABI of 1 by Betha Loa. Post procedure Diagnosis Wound #10: Same as Pre-Procedure Plan Follow-up Appointments: Wound #10 Left Calcaneus: Return Appointment in 1 week. Bathing/ Shower/ Hygiene: May shower; gently cleanse wound with antibacterial soap, rinse and pat dry prior to dressing wounds Off-Loading: Open toe surgical shoe WOUND #10: - Calcaneus Wound Laterality: Left [name], [name] ([id number]) [id number] Page 13 of 14 Topical: Mupirocin Ointment 1 x Per Week/30 Days Discharge Instructions: Apply as directed by provider. Prim Dressing: Hydrofera Blue Ready Transfer Foam, 2.5x2.5 (in/in) (Dispense As Written) 1 x Per Week/30 Days ary Discharge Instructions: Apply Hydrofera Blue Ready to wound bed as directed Secondary Dressing: ABD Pad 5x9 (in/in) 1 x Per Week/30 Days Discharge Instructions: Cover with ABD pad Com pression Wrap: Urgo K2 Lite, two layer compression system, regular 1 x Per Week/30 Days WOUND #15: - Lower Leg Wound Laterality: Left Cleanser: Soap and Water 1 x Per Week/15 Days Discharge Instructions: Gently cleanse wound with antibacterial soap, rinse and pat dry prior to dressing wounds Prim Dressing: Silvercel Small 2x2 (in/in) 1 x Per Week/15 Days ary Discharge Instructions: Apply Silvercel Small 2x2 (in/in) as instructed Secondary Dressing: ABD Pad 5x9 (in/in) 1 x Per Week/15 Days Discharge Instructions: Cover with ABD pad Com pression Wrap: Urgo K2 Lite, two layer compression system, regular 1 x Per Week/15 Days 1. I am going to recommend that we have the patient continue to monitor for any evidence of infection or worsening. Based on what I am seeing I do believe that we will make an excellent headway towards closure. After cleaning this up today everything looks much better. 2. I am going to  suggest as well that the patient should continue specifically with the silver alginate which I think has been beneficial as well and he is in agreement with the plan. 3. I am also going to recommend that we continue with the Urgo K2 compression wrap which is doing well we are using the light version. We will see patient back for reevaluation in 1 week here in the clinic. If anything worsens or changes patient will contact our office for additional recommendations. Electronic Signature(s) Signed: 06/07/2023 2:47:28 PM By: [name] Derry PA-C Entered By: [name] Derry on 06/07/2023 11:47:27 -------------------------------------------------------------------------------- SuperBill Details Patient Name: Date of Service: [name], [name] [name] 06/07/2023 Medical Record Number: [id number] Patient Account Number: [id number] Date of Birth/Sex: Treating RN: Nov 05, 1945 (76 y.o. 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 Facility Procedures : CPT4 Code: [id number] Description: 11042 - DEB SUBQ TISSUE 20 SQ CM/< ICD-10 Diagnosis Description 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sed Modifier: Quantity: 1 Physician Procedures : CPT4 Code Description  Modifier [id number] 11042 - WC PHYS SUBQ TISS 20 SQ CM ICD-10 Diagnosis Description 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sed REMIGIO, KARAMAN ([id number])  [id number] Page 14 of Quantity: 1 14 Electronic Signature(s) Signed: 06/07/2023 2:47:48 PM By: [name] Derry  PA-C Entered By: [name] Derry on 06/07/2023 11:47:48

## 2023-06-14 ENCOUNTER — Encounter: Payer: Medicare HMO | Admitting: Internal Medicine

## 2023-06-14 DIAGNOSIS — E11621 Type 2 diabetes mellitus with foot ulcer: Secondary | ICD-10-CM | POA: Diagnosis not present

## 2023-06-16 NOTE — Progress Notes (Signed)
GEARLD, LEMERY ([id number]) [id number] Page 1 of 12 Visit Report for 06/14/2023 HPI Details Patient Name: Date of Service: [name] [facility] 06/14/2023 1:45 PM Medical Record Number: [id number] Patient Account Number: [id number] Date of Birth/Sex: Treating RN: 05/25/1946 (77 y.o. [name]) [name] Primary Care Provider: [name] Other Clinician: Betha Loa Referring Provider: Treating Provider/Extender: RO BSO N, MICHA EL Cathlean Sauer in Treatment: 66 History of Present Illness HPI Description: 09/24/2020 on evaluation today patient presents today for a heel ulcer that he tells me has been present for about 2 years. He has been seeing podiatry and they have been attempting to manage this including what sounds to be a total contact cast, Unna boot, and just standard dressings otherwise as well. Most recently has been using triple antibiotic ointment. With that being said unfortunately despite everything he really has not had any significant improvement. He tells me that he cannot even really remember exactly how this began but he presumed it may have rubbed on his shoes or something of that nature. 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 He tells me that this does seem to help. Fortunately there is no signs of active infection at this time. His most recent hemoglobin A1c was 8.1 though he knows this was this year he cannot tell me the exact time. His fluid pills currently to help with some of the lower extremity edema although he does obviously have signs of venous stasis/lymphedema. Currently there is no evidence of active infection. No fevers, chills,  nausea, vomiting, or diarrhea. Patient has had fairly recent ABIs which were performed on 07/19/2020 and revealed that he has normal findings in both the ankle and toe locations bilaterally. His ABI on the right was 1.09 on the left was 1.08 with a TBI on the right of 0.88 and on the left of 0.94. Triphasic flow was noted throughout. 10/08/2020 on evaluation today patient appears to be doing pretty well in regard to his left heel currently in fact this is doing a great job and seems to be healing quite nicely. Unfortunately on his right leg he had a pile of wood that actually fell on him injuring his right leg this is somewhat erythematous has me concerned little bit about cellulitis though there is not really a good area to culture at this point. 10/24/2020 upon evaluation today patient appears to be doing well with regard to his heel ulcer. He is showing signs of improvement which is great news. His right leg is completely healed. Overall I feel like he is doing excellent and there is no signs of infection. 11/07/2020 upon evaluation today patient appears to be doing well with regard to his heel ulcer. He tells me that last week when he was unable to come in his wife actually thought that the wound was very close to closing if not closed. Then it began to "reopen again". I really feel like what may have happened as the collagen may have dried over the wound bed and that because that misunderstanding with thinking that the wound was healing. With that being said I did not see it last week I do not know that for certain. Either way I feel  like he is doing great today I see no signs of infection at this point. 11/26/2020 upon inspection today patient appears to be doing decently well in regard to his heel ulcer. He has been tolerating the dressing changes without complication. Fortunately there is no sign of active infection at this time. No fevers, chills, nausea, vomiting, or diarrhea. 12/10/2020 upon  evaluation today patient appears to be doing fairly well in regard to the wound on his heel as well as what appears to be a new wound of the left first metatarsal head plantar aspect. This seems to be an area that was callus that has split as the patient tells me has been trying to walk on his toes more has probably where this came from. With that being said there does not appear to be signs of active infection which is great news. 12/17/2020 upon evaluation today patient actually appears to be making good progress currently. Fortunately there is no evidence of active infection at this time. Overall I feel like he is very close to complete closure. 12/26/2020 upon evaluation today patient appears to be doing excellent in regard to his wounds. In fact I am not certain that these are not even completely healed on initial inspection. Overall I am very pleased with where things stand today. Good news is that they are healed he is actually get ready to go out of town and that will be helpful as well as he will be a full part of the time. Readmission: 02/04/2021 upon evaluation today patient appears to be doing well at this point in regard to his left heel that I previously saw him for. Unfortunately he is having issues with his right lower extremity. He has significant wounds at this point he also has erythema noted there is definite signs of cellulitis which is unfortunate. With that being said I think we do need to address this sooner rather than later. The good news is he did have a nice trip to the beach. He tells me that he had no issues during that time. 4/27; patient on Bactrim. Culture showed methicillin sensitive staph aureus therefore the Bactrim should be effective. 2 small areas on the right leg are healed the area on the mid aspect of the tibia almost 100% covered in a very adherent necrotic debris. We have been using silver alginate 02/20/2021 upon evaluation today patient appears to be doing well  with regard to his wound. He is showing signs of improvement which is great news overall very pleased with where things stand today. No fevers, chills, nausea, vomiting, or diarrhea. 02/27/2021 upon evaluation today patient appears to be doing well with regard to his leg ulcer. He is tolerating the dressing changes and overall appears to be doing quite excellent. I am extremely pleased with where things stand and overall I think patient is making great progress. There is no sign of active infection at this time which is also great news. 03/06/2021 upon evaluation today patient appears to be doing excellent in regard to his wounds. In fact he appears to be completely healed today based on what I am seeing. This is excellent news and overall I am extremely pleased with where he stands. Overall the patient is happy to hear this as well this has been AIDEEN, MEDIATE ([id number]) [id number] Page 2 of 12 quite sometime coming. Readmission: 04/01/2021 patient unfortunately returns for readmission today. He tells me that he has been wearing his compression socks on the right leg daily and  he does not really know what is going on and why his legs are doing what they are doing. We did therefore go ahead and probe deeper into exactly what has been going on with him today. Subsequently the patient tells me that when he gets up and what we would call "first thing in the morning" are really 2 different things". He does not tend to sleep well so he tells me that he will often wake up at 3:00 in the morning. He will then potentially going into the living room to get in his chair where he may read a book for a little while and then potentially fall asleep back in his chair. He then subsequently wake up around 5:00 or so and then get up and in his words "putter around". This often will end with him proceeding at some point around 8 AM or 9 AM to putting on his compression socks. With  that being said this means that anywhere from the 3:00 in the morning till roughly around 9:00 in the morning he has no compression on yet he is sitting in his recliner, walking around and up and about, and this is at least 5 to 6 hours of noncompressed time. That may be our issue here but I did not realize until we discussed this further today. Fortunately there does not appear to be any signs of active infection at this time which is great news. With that being said he has multiple wounds of the bilateral lower extremities. 04/10/2021 upon evaluation today patient appears to be doing well with regard to his legs for the most part. He does look like he had some injury where his wrap slid down nonetheless he did some "doctoring on them". I do feel like most of the areas honestly have cleared back up which is great news. There does not appear to be any signs of active infection at this time which is also great news. No fevers, chills, nausea, vomiting, or diarrhea. 04/18/2021 upon evaluation today patient appears to be doing well with regard to his wounds in fact he appears to be completely healed which is great news. Fortunately there does not appear to be any signs of active infection which is great news. No fevers, chills, nausea, vomiting, or diarrhea. READMISSION 07/28/2021 This is a now 77 year old man we have had in this clinic for quite a bit of this year. 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 He wears compression stockings. 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 He comes in an old running shoes although he says he wears better shoes at home. His history is that he noticed a blister in the right posterior heel. This open. He has been applying Neosporin to it currently the wound measures 2 x 1.4 cm. 100% slough covered. Not really offloading this in any rigorous way. Past  medical history is essentially unchanged he has paroxysmal atrial fibrillation on Coumadin type 2 diabetes with a recent hemoglobin A1c of 7 on metformin. ABI in our clinic was 0.98 on the right 08/05/2021 upon evaluation today patient appears to be doing well with regard to his heel ulcer. Fortunately there does not appear to be any signs of active infection at this time. Overall I been very pleased with where things seem to be currently as far as the wound healing is concerned. Again this is first a lot of seeing him Dr. Leanord Hawking  readmitted him last week. Nonetheless I think we are definitely making some progress here. 08/11/2021 upon evaluation today patient's wound on the heel actually showing signs of excellent improvement. I am actually very pleased with where things stand currently. No fevers, chills, nausea, vomiting, or diarrhea. There does not appear to be any need for sharp debridement today either which is also great news. 08/25/2021 upon evaluation today patient appears to be doing well with regard to his heel ulcer. This is actually looking significantly improved compared to last time I saw him. Fortunately there does not appear to be any evidence of active infection at this time. No fevers, chills, nausea, vomiting, or diarrhea. 09/01/2021 upon evaluation today patient appears to be doing decently well in regard to his wounds. Fortunately there does not appear to be any signs of active infection at this time which is great news and overall very pleased in that regard. I do not see any evidence of active infection systemically which is great news as well. No fevers, chills, nausea, vomiting, or diarrhea. I think that the heel is making excellent progress. 09/15/2021 upon evaluation today patient's heel unfortunately is significantly worse compared to what it was previous. I do believe that at this time he would benefit from switching back to something a little bit more offloading from the  cushion shoe he has right now this is more of a heel protector what I really need is a Prevalon offloading boot which I think is good to do a lot better for him than just the small heel protector. 09/23/2021 upon evaluation today patient appears to be doing a little better in regards to last week's visit. Overall I think that he is making good progress which is great news and there does not appear to be any evidence of active infection at this time. No fevers, chills, nausea, vomiting, or diarrhea. 09/30/2021 upon evaluation patient's heel is actually showing signs of good improvement which is great news. Fortunately there does not appear to be any evidence of active infection locally nor systemically at this point. No fevers, chills, nausea, vomiting, or diarrhea. 10/07/2021 upon evaluation today patient appears to be doing well currently in regard to his heel ulcer. He has been tolerating the dressing changes without complication. Fortunately I do not see any signs of active infection at this time which is great news. No fevers, chills, nausea, vomiting, or diarrhea. 12/27; wound is measuring smaller. We have been using silver alginate 10/28/2021 upon evaluation today patient appears to be doing well currently in regard to his heel ulcer. I am actually very pleased with where things stand and I think he is making excellent progress. Fortunately I do not see any signs of active infection locally nor systemically at this point which is great news. Nonetheless I do believe that the patient would benefit from a new offloading shoe and has been using it Velcro is no longer functioning properly and he tells me that he almost tripped and fell because of that today. Obviously I do not want him following that would be very bad. 11/11/2021 upon evaluation today patient appears to be doing excellent in regard to his wound. In fact this appears to be completely healed based on what I see currently. I do not see any  signs of anything open or draining and I did double check just by clearing away some of the callus around the edges of the wound to ensure that there was nothing still open or hiding underneath. Once  this was cleared away it appeared that the patient was doing significantly better at this time which is great news and there was nothing actually open. Readmission: 06-19-2022 upon evaluation today patient appears to be doing well currently in regard to his heel ulcer all things considered. He unfortunately is having a bit of breakdown in general as far as the heel is concerned not nearly as bad as what we noted last time he was here in the clinic. The good news is I do think that this should hopefully heal much more rapidly than what we noted last time. His past medical history has not changed he is on Coumadin. 07-03-2022 upon evaluation today patient appears to be doing better in regard to his heel. We will start to see some improvement here which is good news. Fortunately I do not see any evidence of infection locally or systemically at this time which is great news. No fevers, chills, nausea, vomiting, or diarrhea. 07-10-2022 upon evaluation today patient appears to be doing well currently in regard to his wound from the callus standpoint around the edges. Unfortunately from an actual wound bed standpoint he has some deep tissue injury noted at this point. Again I am not sure exactly what is going on that is causing this pressure to the region but he is definitely gotten pressure that is occurring and causing this to not heal as effectively as what we would like to see. I discussed with him today that he may need to at night when he sleeping use a pillow up underneath his calf in order to prevent the heel from touching the bed at all. I also think that it may be beneficial for him to monitor throughout his day and make sure there is no other time when he is getting the pressure to the heel. 07-23-2022 upon  evaluation today patient appears to be doing poorly currently in regard to his heel ulcer. He has been tolerating the dressing changes without TRIEU, KOREY ([id number]) [id number] Page 3 of 12 complication. Unfortunately I feel like he may be infected based on what I am seeing. 07-28-2022 I did review patient's culture results which showed positive for Proteus as well as Staphylococcus both of which would be treated with the Bactrim DS that I gave him at the last visit. This is good news and hopefully means that we should be able to apply the cast today as long as the wound itself does not appear to be doing poorly. Patient's wound bed actually showed signs of doing quite well and I am very pleased with where things stand and I do not see any signs of worsening overall which is great news. 08-04-2022 upon evaluation today patient appears to be doing well currently in regard to his heel ulcer. I am actually very pleased with where things stand and I do think this is looking significantly better. With regard to his right shin that is also showing signs of improvement which is great news. 08-10-2022 upon evaluation today patient's wound on the heel actually appears to be doing significantly better. In regard to the right anterior shin this is showing signs of healing it might even be completely healed but I still get a monitor I cannot get anything to fill away but also could not see where there was anything actually draining at this point. I am tending to think healed but I want a monitor 1 more week before I call it for sure. 08-17-2022 upon evaluation today patient appears  to be doing well currently in regard to his heel. Fortunately he is tolerating the dressing changes without complication. I do not see any signs of infection and overall I think he is making good progress here. We did get approval for the Apligraf but I think he had a $295 co-pay that would have to be  paid per application. With that being said as good as he is doing right now I do not think that is necessary but is still an option depending on how things progress if we need to speed this up quite a bit. 08/24/2022; this patient has a wound on his left heel in the setting of type 2 diabetes. We have been using Prisma. He has a heel offloading boot 08-31-2022 upon evaluation today patient appears to be doing poorly in regard to his heel ulcer which is still showing signs of bruising I am just not as happy as I was when I saw him 2 weeks ago with this. He saw Dr. Leanord Hawking last week. Also did not look good at that point apparently. Nonetheless I think that the patient is still continuing to have some counterpressure getting to the wound bed unfortunately. 09-07-2022 upon evaluation today patient appears to be doing well currently in regard to his heel ulcer. He has been tolerating the dressing changes without complication. Fortunately there does not appear to be any signs of active infection locally or systemically at this time. Fortunately I do not see any evidence of active infection at this time. 09-15-2022 upon evaluation today patient appears to be doing well currently in regard to his legs which in fact appear to be completely healed this is great news. With that being said his heel does have some erythema and warmth as well as drainage I am concerned about cellulitis at this location. 09-21-2022 upon evaluation today patient's wound is actually showing signs of being a little bit smaller but still we are not doing nearly as well as what I would like to see. Fortunately I do not see any signs of infection at this time which is great news and overall I am extremely pleased in that regard. With that being said I do think that he needs to continue to elevate his leg although the edema is under great control with a compression wrap I think switching to a different dressing topically may be beneficial. My  suggestion is to switch him over to a Iodoflex dressing. 09-28-2022 upon evaluation today patient appears to be doing well with regard to his heel ulcer. This is actually showing signs of improvement which is great news and overall I am extremely pleased with where we stand today. 10-06-2022 upon evaluation today patient actually appears to be making excellent progress at this point. Fortunately there does not appear to be any signs of active infection locally nor systemically which is great news and overall I am extremely pleased with where we stand. I do feel like he is actually making some pretty good progress here as we switch to the wrap along with the Iodoflex. 12/26; patient has a wound on the tip of his left heel. This is not a weightbearing surface. He is using a heel offloading boot and carefully offloading this at other times during the day. He is using Iodoflex and Zetuvitunder 3 layer compression 10-22-2022 upon evaluation today patient appears to be doing well currently in regard to his wound. He has been tolerating the dressing changes without complication. Fortunately there does not appear to be  any signs of active infection locally nor systemically at this time. No fevers, chills, nausea, vomiting, or diarrhea. 08-30-2023 upon evaluation today patient appears to be doing well currently in regard to his heel ulcer which is showing signs of good improvement. Fortunately I do not see any evidence of infection locally nor systemically which is great news and overall I am extremely pleased with where we stand. No fevers, chills, nausea, vomiting, or diarrhea. Unfortunately he does have a wound on the side of his fifth toe where I think this did rub on the shoe. 11-05-2022 upon evaluation today patient still still showing some signs of pressure at this point and there may have even been some fluid collection at this time. Fortunately I do not see any evidence of active infection locally nor  systemically which is great news and overall I am extremely pleased with where we stand. No fevers, chills, nausea, vomiting, or diarrhea. 1/25; the patient's area on the dorsal aspect of the left fifth toe is healed. We have been using Iodoflex to the area on the left heel. The lateral wound is slightly down in length. 11-19-2022 upon evaluation today patient appears to be doing well currently in regard to his wound. He has been tolerating the dressing changes without complication. Fortunately there does not appear to be any signs of infection we been using Iodoflex up to this point. 11-26-2022 upon evaluation today patient appears to be doing poorly currently in regard to his wound. He has been tolerating the dressing changes with the Iodoflex without complication. With that being said he does not have any signs of active infection systemically though locally I do feel like there is some evidence of infection here. 12-03-2022 upon evaluation today patient appears to be doing well currently in regard to his wound this is better with the Bactrim compared to last week still there was some significant slough and biofilm buildup which is going require sharp debridement at this point. Fortunately I do not see any signs of active infection locally nor systemically which is great news. 12-11-2022 upon evaluation today patient actually appears to be doing significantly better at this time in regard to his heel. I am actually very pleased with where things stand currently. There is no signs of active infection locally or systemically at this point. 12-17-2022 upon evaluation today patient appears to be doing better as far as the overall appearance of his wound. I am actually very pleased in that regard. Fortunately there does not appear to be any signs of active infection locally nor systemically at this time. I do feel like that the patient is showing signs of excellent improvement in general. Nonetheless he needs  something better for compression I think he should go ahead and see about getting compression socks. 3/7; tip of the left heel measurements are smaller he has been using Hydrofera Blue 01-07-2023 upon evaluation today patient appears to be doing well currently in regard to his wound. This has not been debrided since I last saw him and it definitely shows that definitely needs some debridement today. Fortunately I do not see any evidence of active infection locally nor systemically which is great news. 01-14-2023 upon evaluation today patient appears to be doing well currently although he does have some erythema and warmth to the heel I feel like he may have developed an infection yet again. This is something that is been ongoing issue we have been trying to manage his foot best we could. With that being  said I do think he may need to go back on the Bactrim this has done well for him in the past. 01-21-2023 upon evaluation today patient appears to be doing better in regard to his heel. He is doing very well with the antibiotics and that is made a big NEMIAH, SCALLION ([id number]) [id number] Page 4 of 12 difference for him based on what I am seeing I am very pleased with where we stand today. He is obviously doing extremely well which is great news. 01-28-2023 upon evaluation today patient appears to be doing well currently in regard to his wound. This is actually showing signs of excellent improvement I am actually very pleased with where we stand and I do believe that he is making good progress here. Fortunately I do not see any evidence of active infection locally nor systemically which is great news. No fevers, chills, nausea, vomiting, or diarrhea. 02-04-2023 upon evaluation today patient's wound is actually showed signs of excellent improvement I am actually very pleased with where we stand. I do not see any signs of active infection locally nor systemically which is great  news and in general I do believe that we are moving in the right direction here. No fevers, chills, nausea, vomiting, or diarrhea. 02-11-2023 upon evaluation patient's wound actually showing signs of excellent improvement I am actually very pleased with where we stand I think that his pain is better there is no signs of infection we will continue to use the gentamicin and overall we are making excellent progress. 02-18-2023 upon evaluation today patient appears to be doing well currently in regard to his wound. He has been tolerating the dressing changes without complication. Fortunately I do not see any signs of active infection locally nor systemically which is great news. 02-25-2023 upon evaluation today patient's heel actually showed signs of excellent improvement. I am actually very pleased with where we stand today and I feel like that he is making really good progress. Fortunately I do not see any signs of active infection which is good news. No fevers, chills, nausea, vomiting, or diarrhea. 03-04-2023 upon evaluation today patient appears to be doing pretty well currently in regard to his heel. He has been tolerating the dressing changes without complication. Fortunately there does not appear to be any signs of active infection locally nor systemically which is great news. No fevers, chills, nausea, vomiting, or diarrhea. 03-11-2023 upon evaluation today patient appears to be doing poorly in regard to his heel ulcer. He tells me he has been having to sleep on his back to having trouble breathing therefore I think he has been having some issues here as far as the heel is concerned it looks like he has a deep tissue injury yet again at this point. Fortunately I do not see any signs of infection which is good news but at the same time I do feel like that he is doing worse than where he was previous. I do not see any signs of active infection systemically which is great news. 03-18-2023 upon evaluation  today patient appears to be doing well currently in fact the heel is significantly better compared to last week. I am extremely pleased with where we are seeing I am hopeful that he will continue to show signs of improvement going forward. 03-25-2023 upon evaluation today patient appears to be doing well currently in regard to his wounds. He is actually showing signs of improvement on the heel and I am very pleased in that  regard. Fortunately I do not see any signs of active infection at this time which is great news. No fevers, chills, nausea, vomiting, or diarrhea. 04-05-2023 upon evaluation today patient's wound is actually showing signs of good improvement. Actually very pleased with where things stand today. Fortunately there does not appear to be any signs of active infection locally or systemically which is great news. 04-19-2023 upon evaluation today patient's wound actually looks the best it has in quite some time. He did have a wrap on for 2 weeks which I think is caused a little bit of irritation going to the forefoot everything else looks to be doing well the wrap did slide down a little bit as well. Fortunately I do not see any signs of infection however I do think that in the future if he has any issues with not being able to get scheduled to see me he should definitely make an appointment at least for a nurse visit he is aware and in agreement with plan going forward. 04-26-2023 upon evaluation today patient's wound is actually significantly smaller this is great news he is making good progress in the past really 5 or 6 visits we have been seeing improvement week by week I am very pleased in that regard. 05-03-2023 upon evaluation today patient appears to be doing excellent in regard to the wound on his heel. This is actually showing signs of improvement he continues to make good progress towards complete closure which is great news. 7/23; wound continues to do nicely. Smaller with healthy  epithelialization. This is actually a reopening we had already healed this area once and we discussed this this visit. 05-18-2023 upon evaluation today patient's wound actually showing signs of improvement. Fortunately I do not see any signs of active infection locally or systemically which is great news. No fevers, chills, nausea, vomiting, or diarrhea. 05-24-2023 upon evaluation today patient's wound on the heel appears to be doing quite well. Fortunately there does not appear to be any signs of active infection at this time which is great news. Fortunately I think that he is making excellent headway with the current treatment regimen. 06-01-2023 upon evaluation today patient appears to be doing excellent in regard to his wound on the heel this is actually showing signs of being smaller measuring better and looking like is doing quite well. Fortunately I do not see any signs of infection at this time. He does have a small wound on the leg which we can also need to address this looks like it was just a small blister that ruptured I think it should heal pretty quickly. 06-07-2023 upon evaluation today patient appears to be doing well currently in regard to his wound. He has been tolerating the dressing changes without complication. Fortunately there does not appear to be any signs of active infection locally or systemically at this time. No fevers, chills, nausea, vomiting, or diarrhea. 8/26; wound near the tip of the left heel. He is a diabetic. We have been using Hydrofera Blue under Urgo K2 lite wrap he has been in his surgical shoe although he tells me it disintegrated and he came in on a regular running shoe today. 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 He is not wearing compression stocking Electronic Signature(s) Signed: 06/14/2023 4:34:10 PM By: Baltazar Najjar MD Entered By: Baltazar Najjar on 06/14/2023 14:16:00 Barrie Folk ([id number])  [id number] Page 5 of 12 -------------------------------------------------------------------------------- Physical Exam Details Patient Name: Date of  Service: [name], [name] 06/14/2023 1:45 PM Medical Record Number: [id number] Patient Account Number: [id number] Date of Birth/Sex: Treating RN: 1946/02/21 (76 y.o. [name]) [name] Primary Care Provider: [name] Other Clinician: Betha Loa Referring Provider: Treating Provider/Extender: RO BSO N, MICHA EL Cathlean Sauer in Treatment: 51 Constitutional Sitting or standing Blood Pressure is within target range for patient.. Pulse regular and within target range for patient.Marland Kitchen Respirations regular, non-labored and within target range.. Temperature is normal and within the target range for the patient.Marland Kitchen appears in no distress. Cardiovascular Pedal pulses are palpable on the left. Edema control is adequate. Notes Wound exam; wound bed actually looks quite healthy small wound just caudal to the tip of the left heel. No debridement appeared to be necessary. No evidence of surrounding infection. The previous wound on the left lower leg remains healed Electronic Signature(s) Signed: 06/14/2023 4:34:10 PM By: Baltazar Najjar MD Entered By: Baltazar Najjar on 06/14/2023 14:19:08 -------------------------------------------------------------------------------- Physician Orders Details Patient Name: Date of Service: [name], [name] 06/14/2023 1:45 PM Medical Record Number: [id number] Patient Account Number: [id number] Date of Birth/Sex: Treating RN: 12/02/1945 (76 y.o. [name] Primary Care Provider: [name] Other Clinician: Betha Loa Referring Provider: Treating Provider/Extender: RO BSO Dorris Carnes, MICHA EL Cathlean Sauer in Treatment: 60 Verbal / Phone Orders: Yes Clinician: [name] Read Back and Verified: Yes Diagnosis Coding Follow-up Appointments Wound #10  Left Calcaneus Return Appointment in 1 week. Bathing/ Shower/ Hygiene May shower; gently cleanse wound with antibacterial soap, rinse and pat dry prior to dressing wounds Off-Loading Open toe surgical shoe Wound Treatment Wound #10 - Calcaneus Wound Laterality: Left ZASHAWN, CONKEY ([id number]) [id number] Page 6 of 12 Topical: Mupirocin Ointment 1 x Per Week/30 Days Discharge Instructions: Apply as directed by provider. Prim Dressing: Hydrofera Blue Ready Transfer Foam, 2.5x2.5 (in/in) (Dispense As Written) 1 x Per Week/30 Days ary Discharge Instructions: Apply Hydrofera Blue Ready to wound bed as directed Secondary Dressing: ABD Pad 5x9 (in/in) 1 x Per Week/30 Days Discharge Instructions: Cover with ABD pad Compression Wrap: Urgo K2 Lite, two layer compression system, regular 1 x Per Week/30 Days Electronic Signature(s) Signed: 06/14/2023 4:34:10 PM By: Baltazar Najjar MD Signed: 06/16/2023 4:41:54 PM By: Betha Loa Entered By: Betha Loa on 06/14/2023 14:12:39 -------------------------------------------------------------------------------- Problem List Details Patient Name: Date of Service: [name], [name] 06/14/2023 1:45 PM Medical Record Number: [id number] Patient Account Number: [id number] Date of Birth/Sex: Treating RN: 04/25/1946 (76 y.o. [name] Primary Care Provider: [name] Other Clinician: Betha Loa Referring Provider: Treating Provider/Extender: RO BSO N, MICHA EL Cathlean Sauer in Treatment: 51 Active Problems ICD-10 Encounter Code Description Active Date MDM Diagnosis E11.621 Type 2 diabetes mellitus with foot ulcer 06/19/2022 No Yes 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sed 06/19/2022 No Yes E11.42 Type 2 diabetes mellitus with diabetic polyneuropathy 06/19/2022 No Yes Inactive Problems Resolved Problems Electronic Signature(s) Signed: 06/14/2023 4:34:10  PM By: Baltazar Najjar MD Entered By: Baltazar Najjar on 06/14/2023 14:15:02 Barrie Folk ([id number]) [id number] Page 7 of 12 -------------------------------------------------------------------------------- Progress Note Details Patient Name: Date of Service: Ollen Gross 06/14/2023 1:45 PM Medical Record Number: [id number] Patient Account Number: [id number] Date of Birth/Sex: Treating RN: 1946/10/13 (76 y.o. [name]) [name] Primary Care Provider: [name] Other Clinician: Betha Loa Referring Provider: Treating Provider/Extender: RO BSO N, MICHA EL Cathlean Sauer in Treatment: 51 Subjective History of  Present Illness (HPI) 09/24/2020 on evaluation today patient presents today for a heel ulcer that he tells me has been present for about 2 years. He has been seeing podiatry and they have been attempting to manage this including what sounds to be a total contact cast, Unna boot, and just standard dressings otherwise as well. Most recently has been using triple antibiotic ointment. With that being said unfortunately despite everything he really has not had any significant improvement. He tells me that he cannot even really remember exactly how this began but he presumed it may have rubbed on his shoes or something of that nature. 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 He tells me that this does seem to help. Fortunately there is no signs of active infection at this time. His most recent hemoglobin A1c was 8.1 though he knows this was this year he cannot tell me the exact time. His fluid pills currently to help with some of the lower extremity edema  although he does obviously have signs of venous stasis/lymphedema. Currently there is no evidence of active infection. No fevers, chills, nausea, vomiting, or diarrhea. Patient has had fairly recent ABIs which were performed on 07/19/2020 and revealed that he has normal findings in both the ankle and toe locations bilaterally. His ABI on the right was 1.09 on the left was 1.08 with a TBI on the right of 0.88 and on the left of 0.94. Triphasic flow was noted throughout. 10/08/2020 on evaluation today patient appears to be doing pretty well in regard to his left heel currently in fact this is doing a great job and seems to be healing quite nicely. Unfortunately on his right leg he had a pile of wood that actually fell on him injuring his right leg this is somewhat erythematous has me concerned little bit about cellulitis though there is not really a good area to culture at this point. 10/24/2020 upon evaluation today patient appears to be doing well with regard to his heel ulcer. He is showing signs of improvement which is great news. His right leg is completely healed. Overall I feel like he is doing excellent and there is no signs of infection. 11/07/2020 upon evaluation today patient appears to be doing well with regard to his heel ulcer. He tells me that last week when he was unable to come in his wife actually thought that the wound was very close to closing if not closed. Then it began to "reopen again". I really feel like what may have happened as the collagen may have dried over the wound bed and that because that misunderstanding with thinking that the wound was healing. With that being said I did not see it last week I do not know that for certain. Either way I feel like he is doing great today I see no signs of infection at this point. 11/26/2020 upon inspection today patient appears to be doing decently well in regard to his heel ulcer. He has been tolerating the dressing changes  without complication. Fortunately there is no sign of active infection at this time. No fevers, chills, nausea, vomiting, or diarrhea. 12/10/2020 upon evaluation today patient appears to be doing fairly well in regard to the  wound on his heel as well as what appears to be a new wound of the left first metatarsal head plantar aspect. This seems to be an area that was callus that has split as the patient tells me has been trying to walk on his toes more has probably where this came from. With that being said there does not appear to be signs of active infection which is great news. 12/17/2020 upon evaluation today patient actually appears to be making good progress currently. Fortunately there is no evidence of active infection at this time. Overall I feel like he is very close to complete closure. 12/26/2020 upon evaluation today patient appears to be doing excellent in regard to his wounds. In fact I am not certain that these are not even completely healed on initial inspection. Overall I am very pleased with where things stand today. Good news is that they are healed he is actually get ready to go out of town and that will be helpful as well as he will be a full part of the time. Readmission: 02/04/2021 upon evaluation today patient appears to be doing well at this point in regard to his left heel that I previously saw him for. Unfortunately he is having issues with his right lower extremity. He has significant wounds at this point he also has erythema noted there is definite signs of cellulitis which is unfortunate. With that being said I think we do need to address this sooner rather than later. The good news is he did have a nice trip to the beach. He tells me that he had no issues during that time. 4/27; patient on Bactrim. Culture showed methicillin sensitive staph aureus therefore the Bactrim should be effective. 2 small areas on the right leg are healed the area on the mid aspect of the tibia  almost 100% covered in a very adherent necrotic debris. We have been using silver alginate 02/20/2021 upon evaluation today patient appears to be doing well with regard to his wound. He is showing signs of improvement which is great news overall very pleased with where things stand today. No fevers, chills, nausea, vomiting, or diarrhea. 02/27/2021 upon evaluation today patient appears to be doing well with regard to his leg ulcer. He is tolerating the dressing changes and overall appears to be doing quite excellent. I am extremely pleased with where things stand and overall I think patient is making great progress. There is no sign of active infection at this time which is also great news. 03/06/2021 upon evaluation today patient appears to be doing excellent in regard to his wounds. In fact he appears to be completely healed today based on what I am seeing. This is excellent news and overall I am extremely pleased with where he stands. Overall the patient is happy to hear this as well this has been quite sometime coming. Readmission: 04/01/2021 patient unfortunately returns for readmission today. He tells me that he has been wearing his compression socks on the right leg daily and he does not really know what is going on and why his legs are doing what they are doing. We did therefore go ahead and probe deeper into exactly what has been going GERIK, BRAMLETT ([id number]) [id number] Page 8 of 12 on with him today. Subsequently the patient tells me that when he gets up and what we would call "first thing in the morning" are really 2 different things". He does not tend to sleep well so he tells me that he  will often wake up at 3:00 in the morning. He will then potentially going into the living room to get in his chair where he may read a book for a little while and then potentially fall asleep back in his chair. He then subsequently wake up around 5:00 or so and then get  up and in his words "putter around". This often will end with him proceeding at some point around 8 AM or 9 AM to putting on his compression socks. With that being said this means that anywhere from the 3:00 in the morning till roughly around 9:00 in the morning he has no compression on yet he is sitting in his recliner, walking around and up and about, and this is at least 5 to 6 hours of noncompressed time. That may be our issue here but I did not realize until we discussed this further today. Fortunately there does not appear to be any signs of active infection at this time which is great news. With that being said he has multiple wounds of the bilateral lower extremities. 04/10/2021 upon evaluation today patient appears to be doing well with regard to his legs for the most part. He does look like he had some injury where his wrap slid down nonetheless he did some "doctoring on them". I do feel like most of the areas honestly have cleared back up which is great news. There does not appear to be any signs of active infection at this time which is also great news. No fevers, chills, nausea, vomiting, or diarrhea. 04/18/2021 upon evaluation today patient appears to be doing well with regard to his wounds in fact he appears to be completely healed which is great news. Fortunately there does not appear to be any signs of active infection which is great news. No fevers, chills, nausea, vomiting, or diarrhea. READMISSION 07/28/2021 This is a now 77 year old man we have had in this clinic for quite a bit of this year. 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 He wears compression stockings. 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 He comes in an old running shoes although he says he wears better shoes at home. His history is that he noticed a blister in the right posterior heel. This open. He has  been applying Neosporin to it currently the wound measures 2 x 1.4 cm. 100% slough covered. Not really offloading this in any rigorous way. Past medical history is essentially unchanged he has paroxysmal atrial fibrillation on Coumadin type 2 diabetes with a recent hemoglobin A1c of 7 on metformin. ABI in our clinic was 0.98 on the right 08/05/2021 upon evaluation today patient appears to be doing well with regard to his heel ulcer. Fortunately there does not appear to be any signs of active infection at this time. Overall I been very pleased with where things seem to be currently as far as the wound healing is concerned. Again this is first a lot of seeing him Dr. Leanord Hawking readmitted him last week. Nonetheless I think we are definitely making some progress here. 08/11/2021 upon evaluation today patient's wound on the heel actually showing signs of excellent improvement. I am actually very pleased with where things stand currently. No fevers, chills, nausea, vomiting, or diarrhea. There does not appear to be any need for sharp debridement today either which is also great news. 08/25/2021 upon evaluation today patient appears to be doing well with  regard to his heel ulcer. This is actually looking significantly improved compared to last time I saw him. Fortunately there does not appear to be any evidence of active infection at this time. No fevers, chills, nausea, vomiting, or diarrhea. 09/01/2021 upon evaluation today patient appears to be doing decently well in regard to his wounds. Fortunately there does not appear to be any signs of active infection at this time which is great news and overall very pleased in that regard. I do not see any evidence of active infection systemically which is great news as well. No fevers, chills, nausea, vomiting, or diarrhea. I think that the heel is making excellent progress. 09/15/2021 upon evaluation today patient's heel unfortunately is significantly worse compared  to what it was previous. I do believe that at this time he would benefit from switching back to something a little bit more offloading from the cushion shoe he has right now this is more of a heel protector what I really need is a Prevalon offloading boot which I think is good to do a lot better for him than just the small heel protector. 09/23/2021 upon evaluation today patient appears to be doing a little better in regards to last week's visit. Overall I think that he is making good progress which is great news and there does not appear to be any evidence of active infection at this time. No fevers, chills, nausea, vomiting, or diarrhea. 09/30/2021 upon evaluation patient's heel is actually showing signs of good improvement which is great news. Fortunately there does not appear to be any evidence of active infection locally nor systemically at this point. No fevers, chills, nausea, vomiting, or diarrhea. 10/07/2021 upon evaluation today patient appears to be doing well currently in regard to his heel ulcer. He has been tolerating the dressing changes without complication. Fortunately I do not see any signs of active infection at this time which is great news. No fevers, chills, nausea, vomiting, or diarrhea. 12/27; wound is measuring smaller. We have been using silver alginate 10/28/2021 upon evaluation today patient appears to be doing well currently in regard to his heel ulcer. I am actually very pleased with where things stand and I think he is making excellent progress. Fortunately I do not see any signs of active infection locally nor systemically at this point which is great news. Nonetheless I do believe that the patient would benefit from a new offloading shoe and has been using it Velcro is no longer functioning properly and he tells me that he almost tripped and fell because of that today. Obviously I do not want him following that would be very bad. 11/11/2021 upon evaluation today patient  appears to be doing excellent in regard to his wound. In fact this appears to be completely healed based on what I see currently. I do not see any signs of anything open or draining and I did double check just by clearing away some of the callus around the edges of the wound to ensure that there was nothing still open or hiding underneath. Once this was cleared away it appeared that the patient was doing significantly better at this time which is great news and there was nothing actually open. Readmission: 06-19-2022 upon evaluation today patient appears to be doing well currently in regard to his heel ulcer all things considered. He unfortunately is having a bit of breakdown in general as far as the heel is concerned not nearly as bad as what we noted last time he  was here in the clinic. The good news is I do think that this should hopefully heal much more rapidly than what we noted last time. His past medical history has not changed he is on Coumadin. 07-03-2022 upon evaluation today patient appears to be doing better in regard to his heel. We will start to see some improvement here which is good news. Fortunately I do not see any evidence of infection locally or systemically at this time which is great news. No fevers, chills, nausea, vomiting, or diarrhea. 07-10-2022 upon evaluation today patient appears to be doing well currently in regard to his wound from the callus standpoint around the edges. Unfortunately from an actual wound bed standpoint he has some deep tissue injury noted at this point. Again I am not sure exactly what is going on that is causing this pressure to the region but he is definitely gotten pressure that is occurring and causing this to not heal as effectively as what we would like to see. I discussed with him today that he may need to at night when he sleeping use a pillow up underneath his calf in order to prevent the heel from touching the bed at all. I also think that it may  be beneficial for him to monitor throughout his day and make sure there is no other time when he is getting the pressure to the heel. 07-23-2022 upon evaluation today patient appears to be doing poorly currently in regard to his heel ulcer. He has been tolerating the dressing changes without complication. Unfortunately I feel like he may be infected based on what I am seeing. 07-28-2022 I did review patient's culture results which showed positive for Proteus as well as Staphylococcus both of which would be treated with the Bactrim DS that I gave him at the last visit. This is good news and hopefully means that we should be able to apply the cast today as long as the wound itself does not appear to be doing poorly. Patient's wound bed actually showed signs of doing quite well and I am very pleased with where things stand and I do not see any signs of worsening overall which is great news. JOWEL, MCCORMAC ([id number]) [id number] Page 9 of 12 08-04-2022 upon evaluation today patient appears to be doing well currently in regard to his heel ulcer. I am actually very pleased with where things stand and I do think this is looking significantly better. With regard to his right shin that is also showing signs of improvement which is great news. 08-10-2022 upon evaluation today patient's wound on the heel actually appears to be doing significantly better. In regard to the right anterior shin this is showing signs of healing it might even be completely healed but I still get a monitor I cannot get anything to fill away but also could not see where there was anything actually draining at this point. I am tending to think healed but I want a monitor 1 more week before I call it for sure. 08-17-2022 upon evaluation today patient appears to be doing well currently in regard to his heel. Fortunately he is tolerating the dressing changes without complication. I do not see any signs of  infection and overall I think he is making good progress here. We did get approval for the Apligraf but I think he had a $295 co-pay that would have to be paid per application. With that being said as good as he is doing right now I do  not think that is necessary but is still an option depending on how things progress if we need to speed this up quite a bit. 08/24/2022; this patient has a wound on his left heel in the setting of type 2 diabetes. We have been using Prisma. He has a heel offloading boot 08-31-2022 upon evaluation today patient appears to be doing poorly in regard to his heel ulcer which is still showing signs of bruising I am just not as happy as I was when I saw him 2 weeks ago with this. He saw Dr. Leanord Hawking last week. Also did not look good at that point apparently. Nonetheless I think that the patient is still continuing to have some counterpressure getting to the wound bed unfortunately. 09-07-2022 upon evaluation today patient appears to be doing well currently in regard to his heel ulcer. He has been tolerating the dressing changes without complication. Fortunately there does not appear to be any signs of active infection locally or systemically at this time. Fortunately I do not see any evidence of active infection at this time. 09-15-2022 upon evaluation today patient appears to be doing well currently in regard to his legs which in fact appear to be completely healed this is great news. With that being said his heel does have some erythema and warmth as well as drainage I am concerned about cellulitis at this location. 09-21-2022 upon evaluation today patient's wound is actually showing signs of being a little bit smaller but still we are not doing nearly as well as what I would like to see. Fortunately I do not see any signs of infection at this time which is great news and overall I am extremely pleased in that regard. With that being said I do think that he needs to continue to  elevate his leg although the edema is under great control with a compression wrap I think switching to a different dressing topically may be beneficial. My suggestion is to switch him over to a Iodoflex dressing. 09-28-2022 upon evaluation today patient appears to be doing well with regard to his heel ulcer. This is actually showing signs of improvement which is great news and overall I am extremely pleased with where we stand today. 10-06-2022 upon evaluation today patient actually appears to be making excellent progress at this point. Fortunately there does not appear to be any signs of active infection locally nor systemically which is great news and overall I am extremely pleased with where we stand. I do feel like he is actually making some pretty good progress here as we switch to the wrap along with the Iodoflex. 12/26; patient has a wound on the tip of his left heel. This is not a weightbearing surface. He is using a heel offloading boot and carefully offloading this at other times during the day. He is using Iodoflex and Zetuvitunder 3 layer compression 10-22-2022 upon evaluation today patient appears to be doing well currently in regard to his wound. He has been tolerating the dressing changes without complication. Fortunately there does not appear to be any signs of active infection locally nor systemically at this time. No fevers, chills, nausea, vomiting, or diarrhea. 08-30-2023 upon evaluation today patient appears to be doing well currently in regard to his heel ulcer which is showing signs of good improvement. Fortunately I do not see any evidence of infection locally nor systemically which is great news and overall I am extremely pleased with where we stand. No fevers, chills, nausea, vomiting, or diarrhea.  Unfortunately he does have a wound on the side of his fifth toe where I think this did rub on the shoe. 11-05-2022 upon evaluation today patient still still showing some signs of  pressure at this point and there may have even been some fluid collection at this time. Fortunately I do not see any evidence of active infection locally nor systemically which is great news and overall I am extremely pleased with where we stand. No fevers, chills, nausea, vomiting, or diarrhea. 1/25; the patient's area on the dorsal aspect of the left fifth toe is healed. We have been using Iodoflex to the area on the left heel. The lateral wound is slightly down in length. 11-19-2022 upon evaluation today patient appears to be doing well currently in regard to his wound. He has been tolerating the dressing changes without complication. Fortunately there does not appear to be any signs of infection we been using Iodoflex up to this point. 11-26-2022 upon evaluation today patient appears to be doing poorly currently in regard to his wound. He has been tolerating the dressing changes with the Iodoflex without complication. With that being said he does not have any signs of active infection systemically though locally I do feel like there is some evidence of infection here. 12-03-2022 upon evaluation today patient appears to be doing well currently in regard to his wound this is better with the Bactrim compared to last week still there was some significant slough and biofilm buildup which is going require sharp debridement at this point. Fortunately I do not see any signs of active infection locally nor systemically which is great news. 12-11-2022 upon evaluation today patient actually appears to be doing significantly better at this time in regard to his heel. I am actually very pleased with where things stand currently. There is no signs of active infection locally or systemically at this point. 12-17-2022 upon evaluation today patient appears to be doing better as far as the overall appearance of his wound. I am actually very pleased in that regard. Fortunately there does not appear to be any signs of  active infection locally nor systemically at this time. I do feel like that the patient is showing signs of excellent improvement in general. Nonetheless he needs something better for compression I think he should go ahead and see about getting compression socks. 3/7; tip of the left heel measurements are smaller he has been using Hydrofera Blue 01-07-2023 upon evaluation today patient appears to be doing well currently in regard to his wound. This has not been debrided since I last saw him and it definitely shows that definitely needs some debridement today. Fortunately I do not see any evidence of active infection locally nor systemically which is great news. 01-14-2023 upon evaluation today patient appears to be doing well currently although he does have some erythema and warmth to the heel I feel like he may have developed an infection yet again. This is something that is been ongoing issue we have been trying to manage his foot best we could. With that being said I do think he may need to go back on the Bactrim this has done well for him in the past. 01-21-2023 upon evaluation today patient appears to be doing better in regard to his heel. He is doing very well with the antibiotics and that is made a big difference for him based on what I am seeing I am very pleased with where we stand today. He is obviously doing extremely well  which is great news. 01-28-2023 upon evaluation today patient appears to be doing well currently in regard to his wound. This is actually showing signs of excellent improvement I am actually very pleased with where we stand and I do believe that he is making good progress here. Fortunately I do not see any evidence of active infection locally nor systemically which is great news. No fevers, chills, nausea, vomiting, or diarrhea. MARLIN, DENNEN ([id number]) [id number] Page 10 of 12 02-04-2023 upon evaluation today patient's wound is actually  showed signs of excellent improvement I am actually very pleased with where we stand. I do not see any signs of active infection locally nor systemically which is great news and in general I do believe that we are moving in the right direction here. No fevers, chills, nausea, vomiting, or diarrhea. 02-11-2023 upon evaluation patient's wound actually showing signs of excellent improvement I am actually very pleased with where we stand I think that his pain is better there is no signs of infection we will continue to use the gentamicin and overall we are making excellent progress. 02-18-2023 upon evaluation today patient appears to be doing well currently in regard to his wound. He has been tolerating the dressing changes without complication. Fortunately I do not see any signs of active infection locally nor systemically which is great news. 02-25-2023 upon evaluation today patient's heel actually showed signs of excellent improvement. I am actually very pleased with where we stand today and I feel like that he is making really good progress. Fortunately I do not see any signs of active infection which is good news. No fevers, chills, nausea, vomiting, or diarrhea. 03-04-2023 upon evaluation today patient appears to be doing pretty well currently in regard to his heel. He has been tolerating the dressing changes without complication. Fortunately there does not appear to be any signs of active infection locally nor systemically which is great news. No fevers, chills, nausea, vomiting, or diarrhea. 03-11-2023 upon evaluation today patient appears to be doing poorly in regard to his heel ulcer. He tells me he has been having to sleep on his back to having trouble breathing therefore I think he has been having some issues here as far as the heel is concerned it looks like he has a deep tissue injury yet again at this point. Fortunately I do not see any signs of infection which is good news but at the same time I  do feel like that he is doing worse than where he was previous. I do not see any signs of active infection systemically which is great news. 03-18-2023 upon evaluation today patient appears to be doing well currently in fact the heel is significantly better compared to last week. I am extremely pleased with where we are seeing I am hopeful that he will continue to show signs of improvement going forward. 03-25-2023 upon evaluation today patient appears to be doing well currently in regard to his wounds. He is actually showing signs of improvement on the heel and I am very pleased in that regard. Fortunately I do not see any signs of active infection at this time which is great news. No fevers, chills, nausea, vomiting, or diarrhea. 04-05-2023 upon evaluation today patient's wound is actually showing signs of good improvement. Actually very pleased with where things stand today. Fortunately there does not appear to be any signs of active infection locally or systemically which is great news. 04-19-2023 upon evaluation today patient's wound actually looks the best  it has in quite some time. He did have a wrap on for 2 weeks which I think is caused a little bit of irritation going to the forefoot everything else looks to be doing well the wrap did slide down a little bit as well. Fortunately I do not see any signs of infection however I do think that in the future if he has any issues with not being able to get scheduled to see me he should definitely make an appointment at least for a nurse visit he is aware and in agreement with plan going forward. 04-26-2023 upon evaluation today patient's wound is actually significantly smaller this is great news he is making good progress in the past really 5 or 6 visits we have been seeing improvement week by week I am very pleased in that regard. 05-03-2023 upon evaluation today patient appears to be doing excellent in regard to the wound on his heel. This is actually  showing signs of improvement he continues to make good progress towards complete closure which is great news. 7/23; wound continues to do nicely. Smaller with healthy epithelialization. This is actually a reopening we had already healed this area once and we discussed this this visit. 05-18-2023 upon evaluation today patient's wound actually showing signs of improvement. Fortunately I do not see any signs of active infection locally or systemically which is great news. No fevers, chills, nausea, vomiting, or diarrhea. 05-24-2023 upon evaluation today patient's wound on the heel appears to be doing quite well. Fortunately there does not appear to be any signs of active infection at this time which is great news. Fortunately I think that he is making excellent headway with the current treatment regimen. 06-01-2023 upon evaluation today patient appears to be doing excellent in regard to his wound on the heel this is actually showing signs of being smaller measuring better and looking like is doing quite well. Fortunately I do not see any signs of infection at this time. He does have a small wound on the leg which we can also need to address this looks like it was just a small blister that ruptured I think it should heal pretty quickly. 06-07-2023 upon evaluation today patient appears to be doing well currently in regard to his wound. He has been tolerating the dressing changes without complication. Fortunately there does not appear to be any signs of active infection locally or systemically at this time. No fevers, chills, nausea, vomiting, or diarrhea. 8/26; wound near the tip of the left heel. He is a diabetic. We have been using Hydrofera Blue under Urgo K2 lite wrap he has been in his surgical shoe although he tells me it disintegrated and he came in on a regular running shoe today. 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 He is not wearing compression  stocking Objective Constitutional Sitting or standing Blood Pressure is within target range for patient.. Pulse regular and within target range for patient.Marland Kitchen Respirations regular, non-labored and within target range.. Temperature is normal and within the target range for the patient.Marland Kitchen appears in no distress. Vitals Time Taken: 1:46 PM, Height: 70 in, Weight: 265 lbs, BMI: 38, Temperature: 98.2 F, Pulse: 72 bpm, Respiratory Rate: 18 breaths/min, Blood Pressure: 143/74 mmHg. Cardiovascular Pedal pulses are palpable on the left. Edema control is adequate. General Notes: Wound exam; wound bed actually looks quite healthy small wound just caudal to the tip of the left heel. No debridement appeared to be necessary.  No evidence of surrounding infection. The previous wound on the left lower leg remains healed Integumentary (Hair, Skin) TAVITA, RODELA ([id number]) [id number] Page 11 of 12 Wound #10 status is Open. Original cause of wound was Gradually Appeared. The date acquired was: 06/05/2022. The wound has been in treatment 51 weeks. The wound is located on the Left Calcaneus. The wound measures 0.4cm length x 0.6cm width x 0.2cm depth; 0.188cm^2 area and 0.038cm^3 volume. There is Fat Layer (Subcutaneous Tissue) exposed. There is a medium amount of serosanguineous drainage noted. The wound margin is flat and intact. There is small (1-33%) pink granulation within the wound bed. There is a small (1-33%) amount of necrotic tissue within the wound bed including Adherent Slough. Wound #15 status is Healed - Epithelialized. Original cause of wound was Gradually Appeared. The date acquired was: 06/01/2023. The wound has been in treatment 1 weeks. The wound is located on the Left Lower Leg. The wound measures 0cm length x 0cm width x 0cm depth; 0cm^2 area and 0cm^3 volume. There is Fat Layer (Subcutaneous Tissue) exposed. There is a none present amount of drainage noted. There is  no granulation within the wound bed. There is no necrotic tissue within the wound bed. 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 There was a Double Layer Compression Therapy Procedure with a pre-treatment ABI of 1 by Betha Loa. Post procedure Diagnosis Wound #10: Same as Pre-Procedure Plan Follow-up Appointments: Wound #10 Left Calcaneus: Return Appointment in 1 week. Bathing/ Shower/ Hygiene: May shower; gently cleanse wound with antibacterial soap, rinse and pat dry prior to dressing wounds Off-Loading: Open toe surgical shoe WOUND #10: - Calcaneus Wound Laterality: Left Topical: Mupirocin Ointment 1 x Per Week/30 Days Discharge Instructions: Apply as directed by provider. Prim Dressing: Hydrofera Blue Ready Transfer Foam, 2.5x2.5 (in/in) (Dispense As Written) 1 x Per Week/30 Days ary Discharge Instructions: Apply Hydrofera Blue Ready to wound bed as directed Secondary Dressing: ABD Pad 5x9 (in/in) 1 x Per Week/30 Days Discharge Instructions: Cover with ABD pad Com pression Wrap: Urgo K2 Lite, two layer compression system, regular 1 x Per Week/30 Days 1. No reason to change the primary dressing which is Hydrofera Blue 2. We replaced the patient's surgical shoe 3. He has chronic venous insufficiency is not wearing stockings although I did not get into this discussion today. Electronic Signature(s) Signed: 06/14/2023 4:34:10 PM By: Baltazar Najjar MD Entered By: Baltazar Najjar on 06/14/2023 14:21:35 Barrie Folk ([id number]) [id number] Page 12 of 12 -------------------------------------------------------------------------------- SuperBill  Details Patient Name: Date of Service: Ollen Gross 06/14/2023 Medical Record Number: [id number] Patient Account Number: [id number] Date of Birth/Sex: Treating RN: 1945/11/14 (76 y.o. [name]) [name] Primary Care Provider: [name] Other Clinician: Betha Loa Referring Provider: Treating Provider/Extender: RO BSO Dorris Carnes, MICHA EL Cathlean Sauer in Treatment: 51 Diagnosis Coding ICD-10 Codes Code Description E11.621 Type 2 diabetes mellitus with foot ulcer 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sed E11.42 Type 2 diabetes mellitus with diabetic polyneuropathy Facility Procedures : CPT4 Code: 51884166 Description: (Facility Use Only) 29581LT - APPLY MULTLAY COMPRS LWR LT LEG Modifier: Quantity: 1 Physician Procedures : CPT4 Code Description Modifier [id number] - WC  PHYS LEVEL 3 - EST PT ICD-10 Diagnosis Description 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sed E11.621 Type 2 diabetes mellitus with foot ulcer E11.42 Type 2  diabetes mellitus with diabetic polyneuropathy Quantity: 1 Electronic Signature(s) Signed: 06/14/2023 4:34:10 PM By: Baltazar Najjar MD Entered By: Baltazar Najjar on 06/14/2023 14:21:55

## 2023-06-18 NOTE — Progress Notes (Signed)
TORRIAN, WETZ ([id number]) [id number] Page 1 of 9 Visit Report for 06/07/2023 Arrival Information Details Patient Name: Date of Service: [name], [name] [facility] J. 06/07/2023 1:45 PM Medical Record Number: [id number] Patient Account Number: [id number] Date of Birth/Sex: Treating RN: 06-Aug-1946 (76 y.o. [name]) [name] Primary Care Sharilynn Cassity: [name] Other Clinician: Betha Loa Referring Breshae Belcher: Treating Daylyn Christine/Extender: [name] in Treatment: 50 Visit Information History Since Last Visit All ordered tests and consults were completed: No Patient Arrived: [name] Added or deleted any medications: No Arrival Time: 13:43 Any new allergies or adverse reactions: No Transfer Assistance: None Had a fall or experienced change in No Patient Identification Verified: Yes activities of daily living that may affect Secondary Verification Process Completed: Yes risk of falls: Patient Requires Transmission-Based Precautions: No Signs or symptoms of abuse/neglect since last visito No Patient Has Alerts: Yes Hospitalized since last visit: No Patient Alerts: Patient on Blood Thinner Implantable device outside of the clinic excluding No Warfarin cellular tissue based products placed in the center Type II Diabetic since last visit: Has Dressing in Place as Prescribed: Yes Has Compression in Place as Prescribed: Yes Pain Present Now: No Electronic Signature(s) Signed: 06/07/2023 4:39:35 PM By: Betha Loa Entered By: Betha Loa on 06/07/2023 10:49:14 -------------------------------------------------------------------------------- Clinic Level of Care Assessment Details Patient Name: Date of Service: [name] General [facility] J. 06/07/2023 1:45 PM Medical Record Number: [id number] Patient Account Number: [id number] Date of Birth/Sex: Treating RN: [birth year] (76 y.o. [name]) [name] Primary Care Caulder Wehner: [name] Other Clinician:  Betha Loa Referring Zarian Colpitts: Treating Izzabella Besse/Extender: [name] in Treatment: 50 Clinic Level of Care Assessment Items TOOL 1 Quantity Score []  - 0 Use when EandM and Procedure is performed on INITIAL visit ASSESSMENTS - Nursing Assessment / Reassessment []  - 0 General Physical Exam (combine w/ comprehensive assessment (listed just below) when performed on new pt. [address] Gar[address]RALPHIE, [name] ([id number]) [id number] Page 2 of 9 []  - 0 Comprehensive Assessment (HX, ROS, Risk Assessments, Wounds Hx, etc.) ASSESSMENTS - Wound and Skin Assessment / Reassessment []  - 0 Dermatologic / Skin Assessment (not related to wound area) ASSESSMENTS - Ostomy and/or Continence Assessment and Care []  - 0 Incontinence Assessment and Management []  - 0 Ostomy Care Assessment and Management (repouching, etc.) PROCESS - Coordination of Care []  - 0 Simple Patient / Family Education for ongoing care []  - 0 Complex (extensive) Patient / Family Education for ongoing care []  - 0 Staff obtains Chiropractor, Records, T Results / Process Orders est []  - 0 Staff telephones HHA, Nursing Homes / Clarify orders / etc []  - 0 Routine Transfer to another Facility (non-emergent condition) []  - 0 Routine Hospital Admission (non-emergent condition) []  - 0 New Admissions / Manufacturing engineer / Ordering NPWT Apligraf, etc. , []  - 0 Emergency Hospital Admission (emergent condition) PROCESS - Special Needs []  - 0 Pediatric / Minor Patient Management []  - 0 Isolation Patient Management []  - 0 Hearing / Language / Visual special needs []  - 0 Assessment of Community assistance (transportation, D/C planning, etc.) []  - 0 Additional assistance / Altered mentation []  - 0 Support Surface(s) Assessment (bed, cushion, seat, etc.) INTERVENTIONS - Miscellaneous []  - 0 External ear exam []  - 0 Patient Transfer (multiple staff / Nurse, adult / Similar  devices) []  - 0 Simple Staple / Suture removal (25 or less) []  - 0 Complex Staple / Suture removal (26 or more) []  - 0 Hypo/Hyperglycemic Management (do not check if billed separately) []  -  0 Ankle / Brachial Index (ABI) - do not check if billed separately Has the patient been seen at the hospital within the last three years: Yes Total Score: 0 Level Of Care: ____ Electronic Signature(s) Signed: 06/07/2023 4:39:35 PM By: Betha Loa Entered By: Betha Loa on 06/07/2023 11:27:45 -------------------------------------------------------------------------------- Compression Therapy Details Patient Name: Date of Service: [name], [name] [name] 06/07/2023 1:45 PM Medical Record Number: [id number] Patient Account Number: [id number] Date of Birth/Sex: Treating RN: 1945/12/14 (76 y.o. [name] Primary Care Shelsey Rieth: [name] Other Clinician: Adarion, [name] ([id number]) [id number] Page 3 of 9 Referring Amandamarie Feggins: Treating Matson Welch/Extender: [name] in Treatment: 50 Compression Therapy Performed for Wound Assessment: Wound #10 Left Calcaneus Performed By: Farrel Gordon, Angie, Compression Type: Double Layer Pre Treatment ABI: 1 Post Procedure Diagnosis Same as Pre-procedure Electronic Signature(s) Signed: 06/07/2023 4:39:35 PM By: Betha Loa Entered By: Betha Loa on 06/07/2023 11:27:11 -------------------------------------------------------------------------------- Encounter Discharge Information Details Patient Name: Date of Service: [name], [name] [name] 06/07/2023 1:45 PM Medical Record Number: [id number] Patient Account Number: [id number] Date of Birth/Sex: Treating RN: 1946-10-19 (76 y.o. [name] Primary Care Zonia Caplin: [name] Other Clinician: Betha Loa Referring Hopelynn Gartland: Treating Perle Gibbon/Extender: [name] in Treatment: 50 Encounter  Discharge Information Items Post Procedure Vitals Discharge Condition: Stable Temperature (F): 98.7 Ambulatory Status: Walker Pulse (bpm): 74 Discharge Destination: Home Respiratory Rate (breaths/min): 18 Transportation: Private Auto Blood Pressure (mmHg): 123/61 Accompanied By: self Schedule Follow-up Appointment: Yes Clinical Summary of Care: Electronic Signature(s) Signed: 06/07/2023 4:39:35 PM By: Betha Loa Entered By: Betha Loa on 06/07/2023 11:46:58 -------------------------------------------------------------------------------- Lower Extremity Assessment Details Patient Name: Date of Service: [name] General [facility] J. 06/07/2023 1:45 PM Medical Record Number: [id number] Patient Account Number: [id number] Date of Birth/Sex: Treating RN: 15-Oct-1946 (76 y.o. [name] Primary Care Fransico Sciandra: [name] Other Clinician: Betha Loa Referring Cy Bresee: Treating Chamara Dyck/Extender: [name] in Treatment: [address] ([id number]) [id number] Page 4 of 9 Edema Assessment Assessed: [Left: Yes] [Right: No] Edema: [Left: Ye] [Right: s] Calf Left: Right: Point of Measurement: 33 cm From Medial Instep 40.4 cm Ankle Left: Right: Point of Measurement: 1 cm From Medial Instep 22.3 cm Vascular Assessment Pulses: Dorsalis Pedis Palpable: [Left:Yes] Electronic Signature(s) Signed: 06/07/2023 4:39:35 PM By: Betha Loa Signed: 06/18/2023 11:59:03 AM By: [name] RN Entered By: Betha Loa on 06/07/2023 11:09:45 -------------------------------------------------------------------------------- Multi Wound Chart Details Patient Name: Date of Service: [name], [name] [name] 06/07/2023 1:45 PM Medical Record Number: [id number] Patient Account Number: [id number] Date of Birth/Sex: Treating RN: 1946-05-09 (76 y.o. 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 N/A Primary Etiology: Extremity Pressure Ulcer N/A N/A Secondary Etiology: Cataracts, Arrhythmia, Coronary Cataracts, Arrhythmia, Coronary N/A Comorbid History: Artery Disease, Hypertension, Type II Artery Disease, Hypertension, Type II Diabetes, Osteoarthritis, Neuropathy Diabetes, Osteoarthritis, Neuropathy KEIR, TRAUGHBER ([id number]) [id number] Page 5 of 9 06/05/2022 06/01/2023 N/A Date Acquired: 50 0 N/A Weeks of Treatment: Open Open N/A Wound Status: No No N/A Wound Recurrence: 0.8x0.7x0.1 0.5x0.4x0.1 N/A Measurements L x W x D (cm) 0.44 0.157 N/A A (cm) :  rea 0.044 0.016 N/A Volume (cm) : 68.90% 90.70% N/A % Reduction in Area: 84.50% 90.60% N/A % Reduction in Volume: Grade 1 Full Thickness Without Exposed N/A Classification: Support Structures Medium Medium N/A Exudate Amount: Serosanguineous Serosanguineous N/A Exudate Type: red, brown red, brown N/A Exudate Color: Flat and Intact N/A N/A Wound Margin: Small (1-33%) Large (67-100%) N/A Granulation Amount: Pink Red N/A Granulation Quality: Small (1-33%) None Present (0%) N/A Necrotic Amount: Fat Layer (Subcutaneous Tissue): Yes Fat Layer (Subcutaneous Tissue): Yes N/A Exposed Structures: Fascia: No Tendon: No Muscle: No Joint: No Bone: No Medium (34-66%) None N/A Epithelialization: Treatment Notes Electronic Signature(s) Signed: 06/07/2023 4:39:35 PM By: Betha Loa Entered By: Betha Loa on 06/07/2023  11:09:51 -------------------------------------------------------------------------------- Multi-Disciplinary Care Plan Details Patient Name: Date of Service: [name], [name] [name] 06/07/2023 1:45 PM Medical Record Number: [id number] Patient Account Number: [id number] Date of Birth/Sex: Treating RN: [birth year] (76 y.o. [name]) [name] Primary Care Zyquan Crotty: [name] Other Clinician: Betha Loa Referring Alysah Carton: Treating Azir Muzyka/Extender: [name] in Treatment: 50 Active Inactive Wound/Skin Impairment Nursing Diagnoses: Impaired tissue integrity Goals: Patient/caregiver will verbalize understanding of skin care regimen Date Initiated: 06/19/2022 Target Resolution Date: 07/19/2022 Goal Status: Active Ulcer/skin breakdown will have a volume reduction of 30% by week 4 Date Initiated: 06/19/2022 Date Inactivated: 07/28/2022 Target Resolution Date: 07/17/2022 Goal Status: Unmet Unmet Reason: infection Ulcer/skin breakdown will have a volume reduction of 50% by week 8 Date Initiated: 07/28/2022 Date Inactivated: 05/18/2023 Target Resolution Date: 03/20/2023 Goal Status: Unmet Unmet Reason: comorbidities Interventions: Assess patient/caregiver ability to obtain necessary supplies Assess patient/caregiver ability to perform ulcer/skin care regimen upon admission and as needed RORI, MCAVOY ([id number]) [id number] Page 6 of 9 Assess ulceration(s) every visit Provide education on ulcer and skin care Treatment Activities: Skin care regimen initiated : 06/19/2022 Topical wound management initiated : 06/19/2022 Notes: Electronic Signature(s) Signed: 06/07/2023 4:39:35 PM By: Betha Loa Signed: 06/18/2023 11:59:03 AM By: [name] RN Entered By: Betha Loa on 06/07/2023 11:28:07 -------------------------------------------------------------------------------- Pain Assessment Details Patient Name: Date of Service: [name], [name] [name] 06/07/2023 1:45 PM Medical Record Number: [id number] Patient Account Number: [id number] Date of Birth/Sex: Treating RN: June 01, 1946 (76 y.o. [name] Primary Care Hesper Venturella: [name] Other Clinician: Betha Loa Referring Zeena Starkel: Treating Beryle Bagsby/Extender: [name] in Treatment: 50 Active Problems Location of Pain Severity and Description of Pain Patient Has Paino No Site Locations Pain Management and Medication Current Pain Management: Electronic Signature(s) Signed: 06/07/2023 4:39:35 PM By: Betha Loa Signed: 06/18/2023 11:59:03 AM By: [name] RN Entered By: Betha Loa on 06/07/2023 10:52:36 Barrie Folk ([id number]) [id number] Page 7 of 9 -------------------------------------------------------------------------------- Patient/Caregiver Education Details Patient Name: Date of Service: [name] General [facility] 8/19/2024andnbsp1:45 PM Medical Record Number: [id number] Patient Account Number: [id number] Date of Birth/Gender: Treating RN: 11-May-1946 (76 y.o. [name]) [name] Primary Care Physician: [name] Other Clinician: Betha Loa Referring Physician: Treating Physician/Extender: [name] in Treatment: 50 Education Assessment Education Provided To: Patient Education Topics Provided Wound/Skin Impairment: Handouts: Other: continue wound care as directed Methods: Explain/Verbal Responses: State content correctly Electronic Signature(s) Signed: 06/07/2023 4:39:35 PM By: Betha Loa Entered By: Betha Loa on 06/07/2023 11:29:02 -------------------------------------------------------------------------------- Wound Assessment Details Patient Name: Date of Service: [name], [name] [name] 06/07/2023 1:45 PM Medical Record Number: [id number] Patient Account Number: [id number] Date of Birth/Sex: Treating RN: [birth year] (76 y.o. [name] Primary Care Monique Gift: [name] Other Clinician: Betha Loa  Referring Chanan Detwiler: Treating Christain Niznik/Extender: [name] in Treatment: 50 Wound Status Wound Number: 10 Primary Diabetic Wound/Ulcer of the Lower Extremity Etiology: Wound Location: Left Calcaneus Secondary Pressure Ulcer Wounding Event: Gradually Appeared Etiology: Date Acquired: 06/05/2022 Wound Open Weeks Of Treatment: 50 Status: Clustered Wound: No Comorbid Cataracts, Arrhythmia, Coronary Artery Disease, Hypertension, History: Type II Diabetes, Osteoarthritis, Neuropathy Photos ROMOND, [name] ([id number]) [id number] Page 8 of 9 Wound Measurements Length: (cm) 0.8 Width: (cm) 0.7 Depth: (cm) 0.1 Area: (cm) 0.44 Volume: (cm) 0.044 % Reduction in Area: 68.9% % Reduction in Volume: 84.5% Epithelialization: Medium (34-66%) Wound Description Classification: Grade 1 Wound Margin: Flat and Intact Exudate Amount: Medium Exudate Type: Serosanguineous Exudate Color: red, brown Foul Odor After Cleansing: No Slough/Fibrino Yes Wound Bed Granulation Amount: Small (1-33%) Exposed Structure Granulation Quality: Pink Fascia Exposed: No Necrotic Amount: Small (1-33%) Fat Layer (Subcutaneous Tissue) Exposed: Yes Necrotic Quality: Adherent Slough Tendon Exposed: No Muscle Exposed: No Joint Exposed: No Bone Exposed: No Electronic Signature(s) Signed: 06/07/2023 4:39:35 PM By: Betha Loa Signed: 06/18/2023 11:59:03 AM By: [name] RN Entered By: Betha Loa on 06/07/2023 11:07:49 -------------------------------------------------------------------------------- Wound Assessment Details Patient Name: Date of Service: [name], [name] [name] 06/07/2023 1:45 PM Medical Record Number: [id number] Patient Account Number: [id number] Date of Birth/Sex: Treating RN: [birth year] (76 y.o. [name] Primary Care Kodi Steil: [name] Other  Clinician: Betha Loa Referring Luiscarlos Kaczmarczyk: Treating Jawaun Celmer/Extender: Hermine Messick Weeks in Treatment: 50 Wound Status Wound Number: 15 Primary Venous Leg Ulcer Etiology: Wound Location: Left Lower Leg Wound Open Wounding Event: Gradually Appeared Status: Date Acquired: 06/01/2023 Comorbid Cataracts, Arrhythmia, Coronary Artery Disease, Hypertension, Weeks Of Treatment: 0 History: Type II Diabetes, Osteoarthritis, Neuropathy Clustered Wound: No Photos LEMAR, LYGA ([id number]) [id number] Page 9 of 9 Wound Measurements Length: (cm) 0.5 Width: (cm) 0.4 Depth: (cm) 0.1 Area: (cm) 0.157 Volume: (cm) 0.016 % Reduction in Area: 90.7% % Reduction in Volume: 90.6% Epithelialization: None Wound Description Classification: Full Thickness Without Exposed Support Structures Exudate Amount: Medium Exudate Type: Serosanguineous Exudate Color: red, brown Foul Odor After Cleansing: No Slough/Fibrino No Wound Bed Granulation Amount: Large (67-100%) Exposed Structure Granulation Quality: Red Fat Layer (Subcutaneous Tissue) Exposed: Yes Necrotic Amount: None Present (0%) Electronic Signature(s) Signed: 06/07/2023 4:39:35 PM By: Betha Loa Signed: 06/18/2023 11:59:03 AM By: [name] RN Entered By: Betha Loa on 06/07/2023 11:08:22 -------------------------------------------------------------------------------- Vitals Details Patient Name: Date of Service: [name], [name] [name] 06/07/2023 1:45 PM Medical Record Number: [id number] Patient Account Number: [id number] Date of Birth/Sex: Treating RN: [birth year] (76 y.o. [name] Primary Care Vanden Fawaz: [name] Other Clinician: Betha Loa Referring Savita Runner: Treating Lajuane Leatham/Extender: [name] in Treatment: 50 Vital Signs Time Taken: 13:49 Temperature (F): 98.7 Height (in): 70 Pulse (bpm): 74 Weight (lbs): 265 Respiratory Rate  (breaths/min): 18 Body Mass Index (BMI): 38 Blood Pressure (mmHg): 123/61 Reference Range: 80 - 120 mg / dl Electronic Signature(s) Signed: 06/07/2023 4:39:35 PM By: Betha Loa Entered By: Betha Loa on 06/07/2023 10:52:31

## 2023-06-18 NOTE — Progress Notes (Signed)
MACUS, DILLE ([id number]) [id number] Page 1 of 10 Visit Report for 06/14/2023 Arrival Information Details Patient Name: Date of Service: [name] [facility] 06/14/2023 1:45 PM Medical Record Number: [id number] Patient Account Number: [id number] Date of Birth/Sex: Treating RN: 10[birth year]19/[name] (77 y.o. [name]) [name] Primary Care [name]: [name] Other Clinician: Betha [name] Referring [name]: Treating [name]/Extender: [name], [name] in Treatment: 51 Visit Information History Since Last Visit All ordered tests and consults were completed: No Patient Arrived: [name] Added or deleted any medications: No Arrival Time: 13:45 Any new allergies or adverse reactions: No Transfer Assistance: None Had a fall or experienced change in No Patient Identification Verified: Yes activities of daily living that [name] affect Secondary Verification Process Completed: Yes risk of falls: Patient Requires Transmission-Based Precautions: No Signs or symptoms of abuse/neglect since last visito No Patient Has Alerts: Yes Hospitalized since last visit: No Patient Alerts: Patient on Blood Thinner Implantable device outside of the clinic excluding No Warfarin cellular tissue based products placed in the center Type II Diabetic since last visit: Has Dressing in Place as Prescribed: Yes Has Compression in Place as Prescribed: Yes Pain Present Now: No Electronic Signature(s) Signed: 06/16/2023 4:41:54 PM By: [name] Entered By: [name] on 06/14/2023 10:46:25 -------------------------------------------------------------------------------- Clinic Level of Care Assessment Details Patient Name: Date of Service: [name] [facility]. 06/14/2023 1:45 PM Medical Record Number: [id number] Patient Account Number: [id number] Date of Birth/Sex: Treating RN: [name] (77 y.o. [name]) [name] Primary Care Sylvio Weatherall: [name] Other  Clinician: Betha [name] Referring Akita Maxim: Treating Triton Heidrich/Extender: [name], [name] in Treatment: 51 Clinic Level of Care Assessment Items TOOL 1 Quantity Score []  - 0 Use when EandM and Procedure is performed on INITIAL visit ASSESSMENTS - Nursing Assessment / Reassessment []  - 0 [name] Physical Exam (combine w/ comprehensive assessment (listed just below) when performed on new pt. [address]AHMAD, MAGANN ([id number]) [id number] Page 2 of 10 []  - 0 Comprehensive Assessment (HX, ROS, Risk Assessments, Wounds Hx, etc.) ASSESSMENTS - Wound and Skin Assessment / Reassessment []  - 0 Dermatologic / Skin Assessment (not related to wound area) ASSESSMENTS - Ostomy and/or Continence Assessment and Care []  - 0 Incontinence Assessment and Management []  - 0 Ostomy Care Assessment and Management (repouching, etc.) PROCESS - Coordination of Care []  - 0 Simple Patient / Family Education for ongoing care []  - 0 Complex (extensive) Patient / Family Education for ongoing care []  - 0 Staff obtains Chiropractor, Records, T Results / Process Orders est []  - 0 Staff telephones HHA, Nursing Homes / Clarify orders / etc []  - 0 Routine Transfer to another Facility (non-emergent condition) []  - 0 Routine Hospital Admission (non-emergent condition) []  - 0 New Admissions / Manufacturing engineer / Ordering NPWT Apligraf, etc. , []  - 0 Emergency Hospital Admission (emergent condition) PROCESS - Special Needs []  - 0 Pediatric / Minor Patient Management []  - 0 Isolation Patient Management []  - 0 Hearing / Language / Visual special needs []  - 0 Assessment of Community assistance (transportation, D/C planning, etc.) []  - 0 Additional assistance / Altered mentation []  - 0 Support Surface(s) Assessment (bed, cushion, seat, etc.) INTERVENTIONS - Miscellaneous []  - 0 External ear exam []  - 0 Patient Transfer (multiple staff / Water quality scientist / Similar devices) []  - 0 Simple Staple / Suture removal (25 or less) []  - 0 Complex Staple / Suture removal (26 or more) []  - 0  Hypo/Hyperglycemic Management (do not check if billed separately) []  - 0 Ankle / Brachial Index (ABI) - do not check if billed separately Has the patient been seen at the hospital within the last three years: Yes Total Score: 0 Level Of Care: ____ Electronic Signature(s) Signed: 06/16/2023 4:41:54 PM By: [name] Entered By: [name] on 06/14/2023 11:12:48 -------------------------------------------------------------------------------- Compression Therapy Details Patient Name: Date of Service: [name], [name] [name] 06/14/2023 1:45 PM Medical Record Number: [id number] Patient Account Number: [id number] Date of Birth/Sex: Treating RN: [name][birth year] (77 y.o. [name] Primary Care Kelissa Merlin: [name] Other Clinician: Nasheed, [name] ([id number]) [id number] Page 3 of 10 Referring [name]: Treating [name]/Extender: [name], [name] [name] Weeks in Treatment: 51 Compression Therapy Performed for Wound Assessment: Wound #10 Left Calcaneus Performed By: [name], [name], Compression Type: Double Layer Pre Treatment ABI: 1 Post Procedure Diagnosis Same as Pre-procedure Electronic Signature(s) Signed: 06/16/2023 4:41:54 PM By: [name] Entered By: [name] on 06/14/2023 11:12:19 -------------------------------------------------------------------------------- Encounter Discharge Information Details Patient Name: Date of Service: [name], [name] [name] 06/14/2023 1:45 PM Medical Record Number: [id number] Patient Account Number: [id number] Date of Birth/Sex: Treating RN: [name] (77 y.o. [name]) [name] Primary Care Zacchaeus Halm: [name] Other Clinician: Betha [name] Referring Jackline Castilla: Treating Mahika Vanvoorhis/Extender: [name], [name] in Treatment: 51 Encounter Discharge Information Items Discharge Condition: Stable Ambulatory Status: Walker Discharge Destination: Home Transportation: Private Auto Accompanied By: self Schedule Follow-up Appointment: Yes Clinical Summary of Care: Electronic Signature(s) Signed: 06/16/2023 4:41:54 PM By: [name] Entered By: [name] on 06/14/2023 11:35:34 -------------------------------------------------------------------------------- Lower Extremity Assessment Details Patient Name: Date of Service: [name] [facility] J. 06/14/2023 1:45 PM Medical Record Number: [id number] Patient Account Number: [id number] Date of Birth/Sex: Treating RN: [birth year][name] (76 y.o. [name] Primary Care Amos Micheals: [name] Other Clinician: Betha [name] Referring Harlie Ragle: Treating Darry Kelnhofer/Extender: Chauncey Mann, [name] in Treatment: [address] [place] J ([id number]) [id number] Page 4 of 10 Edema Assessment Assessed: [Left: Yes] [Right: No] Edema: [Left: Ye] [Right: s] Calf Left: Right: Point of Measurement: 33 cm From Medial Instep 41 cm Ankle Left: Right: Point of Measurement: 10 cm From Medial Instep 21.5 cm Vascular Assessment Pulses: Dorsalis Pedis Palpable: [Left:Yes] Electronic Signature(s) Signed: 06/16/2023 4:41:54 PM By: [name] Signed: 06/18/2023 11:58:03 AM By: [name] Pax RN Entered By: [name] on 06/14/2023 11:05:07 -------------------------------------------------------------------------------- Multi Wound Chart Details Patient Name: Date of Service: [name], [name] [name] 06/14/2023 1:45 PM Medical Record Number: [id number] Patient Account Number: [id number] Date of Birth/Sex: Treating RN: [name][birth year] (76 y.o. 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/A Primary Etiology: Extremity Pressure Ulcer [name]/A [name]/A Secondary Etiology: Cataracts, Arrhythmia, Coronary Cataracts, Arrhythmia, Coronary [name]/A Comorbid History: Artery Disease, Hypertension, Type II Artery Disease, Hypertension, Type II Diabetes, Osteoarthritis, Neuropathy Diabetes, Osteoarthritis, Neuropathy [name], [name] ([id number]) [id number] Page 5 of 10 06/05/2022 06/01/2023 [name]/A Date Acquired: 51 1 [name]/A Weeks of Treatment: Open Open [name]/A Wound Status: No No [name]/A Wound Recurrence: 0.4x0.6x0.2 0.1x0.1x0.1 [name]/A Measurements L x  W x D (cm) 0.188 0.008 [name]/A A (cm) : rea 0.038 0.001 [name]/A Volume (cm) : 86.70% 99.50% [name]/A % Reduction in Area: 86.60% 99.40% [name]/A % Reduction in Volume: Grade 1 Full Thickness Without Exposed [name]/A Classification: Support Structures Medium Medium [name]/A Exudate Amount: Serosanguineous Serosanguineous [name]/A Exudate Type: red, brown red, brown [name]/A Exudate Color: Flat and Intact [name]/A [name]/A Wound Margin: Small (1-33%) Large (67-100%) [name]/A Granulation Amount: Pink Red [name]/A Granulation Quality: Small (1-33%) None Present (0%) [name]/A Necrotic Amount: Fat Layer (Subcutaneous Tissue): Yes Fat Layer (Subcutaneous Tissue): Yes [name]/A Exposed Structures: Fascia: No Tendon: No Muscle: No Joint: No Bone: No Medium (34-66%) None [name]/A Epithelialization: Treatment Notes Electronic Signature(s) Signed: 06/16/2023 4:41:54 PM By: [name] Entered By: [name] on 06/14/2023  11:05:15 -------------------------------------------------------------------------------- Multi-Disciplinary Care Plan Details Patient Name: Date of Service: [name], [name] [name] 06/14/2023 1:45 PM Medical Record Number: [id number] Patient Account Number: [id number] Date of Birth/Sex: Treating RN: [name][birth year] (76 y.o. [name]) [name] Primary Care Kaiser Belluomini: [name] Other Clinician: Betha [name] Referring Taysean Wager: Treating Koula Venier/Extender: [name], [name] in Treatment: 51 Active Inactive Wound/Skin Impairment Nursing Diagnoses: Impaired tissue integrity Goals: Patient/caregiver will verbalize understanding of skin care regimen Date Initiated: 06/19/2022 Target Resolution Date: 07/19/2022 Goal Status: Active Ulcer/skin breakdown will have a volume reduction of 30% by week 4 Date Initiated: 06/19/2022 Date Inactivated: 07/28/2022 Target Resolution Date: 07/17/2022 Goal Status: Unmet Unmet Reason: infection Ulcer/skin breakdown will have a volume reduction of 50% by week 8 Date Initiated: 07/28/2022 Date Inactivated: 05/18/2023 Target Resolution Date: 03/20/2023 Goal Status: Unmet Unmet Reason: comorbidities Interventions: Assess patient/caregiver ability to obtain necessary supplies Assess patient/caregiver ability to perform ulcer/skin care regimen upon admission and as needed GEOVANI, ISA ([id number]) [id number] Page 6 of 10 Assess ulceration(s) every visit Provide education on ulcer and skin care Treatment Activities: Skin care regimen initiated : 06/19/2022 Topical wound management initiated : 06/19/2022 Notes: Electronic Signature(s) Signed: 06/16/2023 4:41:54 PM By: [name] Signed: 06/18/2023 11:58:03 AM By: [name] Pax RN Entered By: [name] on 06/14/2023 11:13:31 -------------------------------------------------------------------------------- Pain Assessment Details Patient Name: Date of  Service: [name], [name] [name] 06/14/2023 1:45 PM Medical Record Number: [id number] Patient Account Number: [id number] Date of Birth/Sex: Treating RN: 10-[birth year]-[name] (76 y.o. [name] Primary Care Ovetta Bazzano: [name] Other Clinician: Betha [name] Referring [name] Muscatello: Treating Devaughn Savant/Extender: [name], [name] in Treatment: 51 Active Problems Location of Pain Severity and Description of Pain Patient Has Paino No Site Locations Pain Management and Medication Current Pain Management: Electronic Signature(s) Signed: 06/16/2023 4:41:54 PM By: [name] Signed: 06/18/2023 11:58:03 AM By: [name] Pax RN Entered By: [name] on 06/14/2023 10:49:44 Barrie Folk ([id number]) [id number] Page 7 of 10 -------------------------------------------------------------------------------- Patient/Caregiver Education Details Patient Name: Date of Service: [name] [name] 8/26/2024andnbsp1:45 PM Medical Record Number: [id number] Patient Account Number: [id number] Date of Birth/Gender: Treating RN: April [birth year], [name] (76 y.o. [name]) [name] Primary Care Physician: [name] Other Clinician: Betha [name] Referring Physician: Treating Physician/Extender: [name], [name] in Treatment: 89 Education Assessment Education Provided To: Patient Education Topics Provided Wound/Skin Impairment: Handouts: Other: continue wound care as directed Methods: Explain/Verbal Responses: State content correctly Electronic Signature(s) Signed: 06/16/2023 4:41:54 PM By: [name] Entered By: [name] on 06/14/2023 11:13:56 -------------------------------------------------------------------------------- Wound Assessment Details Patient Name: Date of Service: [name], [name] [name] 06/14/2023 1:45 PM Medical Record Number: [id number] Patient Account Number:  [id number] Date of Birth/Sex: Treating  RN: [birth year] (76 y.o. [name]) [name] Primary Care Keishawn Darsey: [name] Other Clinician: Betha [name] Referring Roman Sandall: Treating Rachael Ferrie/Extender: [name], [name] in Treatment: 51 Wound Status Wound Number: 10 Primary Diabetic Wound/Ulcer of the Lower Extremity Etiology: Wound Location: Left Calcaneus Secondary Pressure Ulcer Wounding Event: Gradually Appeared Etiology: Date Acquired: 06/05/2022 Wound Open Weeks Of Treatment: 51 Status: Clustered Wound: No Comorbid Cataracts, Arrhythmia, Coronary Artery Disease, Hypertension, History: Type II Diabetes, Osteoarthritis, Neuropathy Photos [name], [name] ([id number]) [id number] Page 8 of 10 Wound Measurements Length: (cm) 0.4 Width: (cm) 0.6 Depth: (cm) 0.2 Area: (cm) 0.188 Volume: (cm) 0.038 % Reduction in Area: 86.7% % Reduction in Volume: 86.6% Epithelialization: Medium (34-66%) Wound Description Classification: Grade 1 Wound Margin: Flat and Intact Exudate Amount: Medium Exudate Type: Serosanguineous Exudate Color: red, brown Foul Odor After Cleansing: No Slough/Fibrino Yes Wound Bed Granulation Amount: Small (1-33%) Exposed Structure Granulation Quality: Pink Fascia Exposed: No Necrotic Amount: Small (1-33%) Fat Layer (Subcutaneous Tissue) Exposed: Yes Necrotic Quality: Adherent Slough Tendon Exposed: No Muscle Exposed: No Joint Exposed: No Bone Exposed: No Treatment Notes Wound #10 (Calcaneus) Wound Laterality: Left Cleanser Peri-Wound Care Topical Mupirocin Ointment Discharge Instruction: Apply as directed by Yarethzy Croak. Primary Dressing Hydrofera Blue Ready Transfer Foam, 2.5x2.5 (in/in) Discharge Instruction: Apply Hydrofera Blue Ready to wound bed as directed Secondary Dressing ABD Pad 5x9 (in/in) Discharge Instruction: Cover with ABD pad Secured With Compression Wrap Urgo K2 Lite, two layer compression system, regular Compression  Stockings Add-Ons Electronic Signature(s) Signed: 06/16/2023 4:41:54 PM By: [name] Signed: 06/18/2023 11:58:03 AM By: [name] Pax RN Entered By: [name] on 06/14/2023 11:03:12 Barrie Folk ([id number]) [id number] Page 9 of 10 -------------------------------------------------------------------------------- Wound Assessment Details Patient Name: Date of Service: [name], [name] [facility] 06/14/2023 1:45 PM Medical Record Number: [id number] Patient Account Number: [id number] Date of Birth/Sex: Treating RN: 11-02-[name] (76 y.o. [name]) [name] Primary Care Sylvester Minton: [name] Other Clinician: Betha [name] Referring Myesha Stillion: Treating Michaeleen Down/Extender: [name], [name] in Treatment: 51 Wound Status Wound Number: 15 Primary Venous Leg Ulcer Etiology: Wound Location: Left Lower Leg Wound Healed - Epithelialized Wounding Event: Gradually Appeared Status: Date Acquired: 06/01/2023 Comorbid Cataracts, Arrhythmia, Coronary Artery Disease, Hypertension, Weeks Of Treatment: 1 History: Type II Diabetes, Osteoarthritis, Neuropathy Clustered Wound: No Photos Wound Measurements Length: (cm) Width: (cm) Depth: (cm) Area: (cm) Volume: (cm) 0 % Reduction in Area: 100% 0 % Reduction in Volume: 100% 0 Epithelialization: Large (67-100%) 0 0 Wound Description Classification: Full Thickness Without Exposed Support Structures Exudate Amount: None Present Foul Odor After Cleansing: No Slough/Fibrino No Wound Bed Granulation Amount: None Present (0%) Exposed Structure Necrotic Amount: None Present (0%) Fat Layer (Subcutaneous Tissue) Exposed: Yes Treatment Notes Wound #15 (Lower Leg) Wound Laterality: Left Cleanser Peri-Wound Care Topical Primary Dressing Secondary Dressing Secured With Compression [name], [name] ([id number]) [id number] Page 10 of 10 Compression  Stockings Add-Ons Electronic Signature(s) Signed: 06/16/2023 4:41:54 PM By: [name] Signed: 06/18/2023 11:58:03 AM By: [name] Pax RN Entered By: [name] on 06/14/2023 11:11:40 -------------------------------------------------------------------------------- Vitals Details Patient Name: Date of Service: [name], [name] [name] 06/14/2023 1:45 PM Medical Record Number: [id number] Patient Account Number: [id number] Date of Birth/Sex: Treating RN: 01/10/[name] (76 y.o. [name] Primary Care Abhi Moccia: [name] Other Clinician: Betha [name] Referring Faye Sanfilippo: Treating Joelene Barriere/Extender: [name], [name] in Treatment: 51 Vital Signs Time Taken: 13:46  Temperature (F): 98.2 Height (in): 70 Pulse (bpm): 72 Weight (lbs): 265 Respiratory Rate (breaths/min): 18 Body Mass Index (BMI): 38 Blood Pressure (mmHg): 143/74 Reference Range: 80 - 120 mg / dl Electronic Signature(s) Signed: 06/16/2023 4:41:54 PM By: [name] Entered By: [name] on 06/14/2023 10:49:37

## 2023-06-22 ENCOUNTER — Encounter: Payer: Medicare HMO | Attending: Physician Assistant | Admitting: Physician Assistant

## 2023-06-22 DIAGNOSIS — L97522 Non-pressure chronic ulcer of other part of left foot with fat layer exposed: Secondary | ICD-10-CM | POA: Insufficient documentation

## 2023-06-22 DIAGNOSIS — E1151 Type 2 diabetes mellitus with diabetic peripheral angiopathy without gangrene: Secondary | ICD-10-CM | POA: Insufficient documentation

## 2023-06-22 DIAGNOSIS — Z952 Presence of prosthetic heart valve: Secondary | ICD-10-CM | POA: Diagnosis not present

## 2023-06-22 DIAGNOSIS — Z7901 Long term (current) use of anticoagulants: Secondary | ICD-10-CM | POA: Insufficient documentation

## 2023-06-22 DIAGNOSIS — L97422 Non-pressure chronic ulcer of left heel and midfoot with fat layer exposed: Secondary | ICD-10-CM | POA: Diagnosis not present

## 2023-06-22 DIAGNOSIS — I10 Essential (primary) hypertension: Secondary | ICD-10-CM | POA: Diagnosis not present

## 2023-06-22 DIAGNOSIS — E1142 Type 2 diabetes mellitus with diabetic polyneuropathy: Secondary | ICD-10-CM | POA: Diagnosis not present

## 2023-06-22 DIAGNOSIS — G8929 Other chronic pain: Secondary | ICD-10-CM | POA: Diagnosis not present

## 2023-06-22 DIAGNOSIS — E11621 Type 2 diabetes mellitus with foot ulcer: Secondary | ICD-10-CM | POA: Insufficient documentation

## 2023-06-22 NOTE — Progress Notes (Addendum)
[name], [name] ([id number]) [id number] Page 1 of 13 Visit Report for 06/22/2023 Chief Complaint Document Details Patient Name: Date of Service: [name], GEO [facility] J. 06/22/2023 1:45 PM Medical Record Number: [id number] Patient Account Number: [id number] Date of Birth/Sex: Treating RN: 28-Feb-1946 (77 y.o. [name] Primary Care Provider: [name] Other Clinician: Betha Loa Referring Provider: Treating Provider/Extender: Hermine Messick Weeks in Treatment: 42 Information Obtained from: Patient Chief Complaint Left heel ulcer Electronic Signature(s) Signed: 06/22/2023 2:19:08 PM By: [name] Derry PA-C Entered By: [name] Derry on 06/22/2023 14:19:08 -------------------------------------------------------------------------------- Debridement Details Patient Name: Date of Service: [name], [name] 06/22/2023 1:45 PM Medical Record Number: [id number] Patient Account Number: [id number] Date of Birth/Sex: Treating RN: [birth year] (77 y.o. [name] Primary Care Provider: [name] Other Clinician: Betha Loa Referring Provider: Treating Provider/Extender: [name] in Treatment: 52 Debridement Performed for Assessment: Wound #10 Left Calcaneus Performed By: Physician [name] Derry, PA-C Debridement Type: Debridement Severity of Tissue Pre Debridement: Fat layer exposed Level of Consciousness (Pre-procedure): Awake and Alert Pre-procedure Verification/Time Out Yes - 14:31 Taken: Start Time: 14:31 Percent of Wound Bed Debrided: 100% T Area Debrided (cm): otal 0.19 Tissue and other material debrided: Viable, Non-Viable, Callus, Slough, Subcutaneous, Slough Level: Skin/Subcutaneous Tissue Debridement Description: Excisional Instrument: Curette Bleeding: Minimum Hemostasis Achieved: Pressure Response to Treatment: Procedure was tolerated well Level of Consciousness (Post- Awake and  Alert procedure): JAYSTEN, ROCKMAN ([id number]) [phone].pdf Page 2 of 13 Post Debridement Measurements of Total Wound Length: (cm) 0.4 Width: (cm) 0.6 Depth: (cm) 0.2 Volume: (cm) 0.038 Character of Wound/Ulcer Post Debridement: Stable Severity of Tissue Post Debridement: Fat layer exposed Post Procedure Diagnosis Same as Pre-procedure Electronic Signature(s) Signed: 06/22/2023 5:22:58 PM By: Betha Loa Signed: 06/22/2023 5:30:17 PM By: Midge Aver MSN RN CNS WTA Signed: 06/25/2023 2:02:35 PM By: [name] Derry PA-C Entered By: Betha Loa on 06/22/2023 14:33:23 -------------------------------------------------------------------------------- HPI Details Patient Name: Date of Service: [name], [name] 06/22/2023 1:45 PM Medical Record Number: [id number] Patient Account Number: [id number] Date of Birth/Sex: Treating RN: 1946-07-22 (77 y.o. [name] Primary Care Provider: [name] Other Clinician: Betha Loa Referring Provider: Treating Provider/Extender: [name] in Treatment: 18 History of Present Illness HPI Description: 09/24/2020 on evaluation today patient presents today for a heel ulcer that he tells me has been present for about 2 years. He has been seeing podiatry and they have been attempting to manage this including what sounds to be a total contact cast, Unna boot, and just standard dressings otherwise as well. Most recently has been using triple antibiotic ointment. With that being said unfortunately despite everything he really has not had any significant improvement. He tells me that he cannot even really remember exactly how this began but he presumed it may have rubbed on his shoes or something of that nature. 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 He tells me that this does seem to help. Fortunately there is no signs of active infection at this time. His most recent hemoglobin A1c was 8.1 though he knows this was this year he cannot tell me the exact time. His fluid pills currently to help with some  of the lower extremity edema although he does obviously have signs of venous stasis/lymphedema. Currently there is no evidence of active infection. No fevers, chills, nausea, vomiting, or diarrhea. Patient has had fairly recent ABIs which were performed on 07/19/2020 and revealed that he has normal findings in both the ankle and toe locations bilaterally. His ABI on the right was 1.09 on the left was 1.08 with a TBI on the right of 0.88 and on the left of 0.94. Triphasic flow was noted throughout. 10/08/2020 on evaluation today patient appears to be doing pretty well in regard to his left heel currently in fact this is doing a great job and seems to be healing quite nicely. Unfortunately on his right leg he had a pile of wood that actually fell on him injuring his right leg this is somewhat erythematous has me concerned little bit about cellulitis though there is not really a good area to culture at this point. 10/24/2020 upon evaluation today patient appears to be doing well with regard to his heel ulcer. He is showing signs of improvement which is great news. His right leg is completely healed. Overall I feel like he is doing excellent and there is no signs of infection. 11/07/2020 upon evaluation today patient appears to be doing well with regard to his heel ulcer. He tells me that last week when he was unable to come in his wife actually thought that the wound was very close to closing if not closed. Then it began to "reopen again". I really feel like what may have happened as the collagen may have dried over the  wound bed and that because that misunderstanding with thinking that the wound was healing. With that being said I did not see it last week I do not know that for certain. Either way I feel like he is doing great today I see no signs of infection at this point. 11/26/2020 upon inspection today patient appears to be doing decently well in regard to his heel ulcer. He has been tolerating the dressing changes without complication. Fortunately there is no sign of active infection at this time. No fevers, chills, nausea, vomiting, or diarrhea. 12/10/2020 upon evaluation today patient appears to be doing fairly well in regard to the wound on his heel as well as what appears to be a new wound of the left first metatarsal head plantar aspect. This seems to be an area that was callus that has split as the patient tells me has been trying to walk on his toes more has probably where this came from. With that being said there does not appear to be signs of active infection which is great news. 12/17/2020 upon evaluation today patient actually appears to be making good progress currently. Fortunately there is no evidence of active infection at this time. Overall I feel like he is very close to complete closure. 12/26/2020 upon evaluation today patient appears to be doing excellent in regard to his wounds. In fact I am not certain that these are not even completely healed on initial inspection. Overall I am very pleased with where things stand today. Good news is that they are healed he is actually get ready to go out of ETHANMICHAEL, [place]OR[place] ([id number]) [phone].pdf Page 3 of 26 town and that will be helpful as well as he will be a full part of the time. Readmission: 02/04/2021 upon evaluation today patient appears to be doing well at this point in regard to his left heel that I  previously saw him for. Unfortunately he is having issues with his right lower extremity. He has significant wounds at  this point he also has erythema noted there is definite signs of cellulitis which is unfortunate. With that being said I think we do need to address this sooner rather than later. The good news is he did have a nice trip to the beach. He tells me that he had no issues during that time. 4/27; patient on Bactrim. Culture showed methicillin sensitive staph aureus therefore the Bactrim should be effective. 2 small areas on the right leg are healed the area on the mid aspect of the tibia almost 100% covered in a very adherent necrotic debris. We have been using silver alginate 02/20/2021 upon evaluation today patient appears to be doing well with regard to his wound. He is showing signs of improvement which is great news overall very pleased with where things stand today. No fevers, chills, nausea, vomiting, or diarrhea. 02/27/2021 upon evaluation today patient appears to be doing well with regard to his leg ulcer. He is tolerating the dressing changes and overall appears to be doing quite excellent. I am extremely pleased with where things stand and overall I think patient is making great progress. There is no sign of active infection at this time which is also great news. 03/06/2021 upon evaluation today patient appears to be doing excellent in regard to his wounds. In fact he appears to be completely healed today based on what I am seeing. This is excellent news and overall I am extremely pleased with where he stands. Overall the patient is happy to hear this as well this has been quite sometime coming. Readmission: 04/01/2021 patient unfortunately returns for readmission today. He tells me that he has been wearing his compression socks on the right leg daily and he does not really know what is going on and why his legs are doing what they are doing. We did therefore go ahead and probe deeper into exactly what has been going on with him today. Subsequently the patient tells me that when he gets up and what  we would call "first thing in the morning" are really 2 different things". He does not tend to sleep well so he tells me that he will often wake up at 3:00 in the morning. He will then potentially going into the living room to get in his chair where he may read a book for a little while and then potentially fall asleep back in his chair. He then subsequently wake up around 5:00 or so and then get up and in his words "putter around". This often will end with him proceeding at some point around 8 AM or 9 AM to putting on his compression socks. With that being said this means that anywhere from the 3:00 in the morning till roughly around 9:00 in the morning he has no compression on yet he is sitting in his recliner, walking around and up and about, and this is at least 5 to 6 hours of noncompressed time. That may be our issue here but I did not realize until we discussed this further today. Fortunately there does not appear to be any signs of active infection at this time which is great news. With that being said he has multiple wounds of the bilateral lower extremities. 04/10/2021 upon evaluation today patient appears to be doing well with regard to his legs for the most part. He does look like he had some injury where his  wrap slid down nonetheless he did some "doctoring on them". I do feel like most of the areas honestly have cleared back up which is great news. There does not appear to be any signs of active infection at this time which is also great news. No fevers, chills, nausea, vomiting, or diarrhea. 04/18/2021 upon evaluation today patient appears to be doing well with regard to his wounds in fact he appears to be completely healed which is great news. Fortunately there does not appear to be any signs of active infection which is great news. No fevers, chills, nausea, vomiting, or diarrhea. READMISSION 07/28/2021 This is a now 77 year old man we have had in this clinic for quite a bit of this  year. 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 He wears compression stockings. 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 He comes in an old running shoes although he says he wears better shoes at home. His history is that he noticed a blister in the right posterior heel. This open. He has been applying Neosporin to it currently the wound measures 2 x 1.4 cm. 100% slough covered. Not really offloading this in any rigorous way. Past medical history is essentially unchanged he has paroxysmal atrial fibrillation on Coumadin type 2 diabetes with a recent hemoglobin A1c of 7 on metformin. ABI in our clinic was 0.98 on the right 08/05/2021 upon evaluation today patient appears to be doing well with regard to his heel ulcer. Fortunately there does not appear to be any signs of active infection at this time. Overall I been very pleased with where things seem to be currently as far as the wound healing is concerned. Again this is first a lot of seeing him Dr. Leanord Hawking readmitted him last week. Nonetheless I think we are definitely making some progress here. 08/11/2021 upon evaluation today patient's wound on the heel actually showing signs of excellent improvement. I am actually very pleased with where things stand currently. No fevers, chills, nausea, vomiting, or diarrhea. There does not appear to be any need for sharp debridement today either which is also great news. 08/25/2021 upon evaluation today patient appears to be doing well with regard to his heel ulcer. This is actually looking significantly improved compared to last time I saw him. Fortunately there does not appear to be any evidence of active infection at this time. No fevers, chills, nausea, vomiting, or diarrhea. 09/01/2021 upon evaluation today patient appears to be doing decently well in regard to his wounds. Fortunately there does  not appear to be any signs of active infection at this time which is great news and overall very pleased in that regard. I do not see any evidence of active infection systemically which is great news as well. No fevers, chills, nausea, vomiting, or diarrhea. I think that the heel is making excellent progress. 09/15/2021 upon evaluation today patient's heel unfortunately is significantly worse compared to what it was previous. I do believe that at this time he would benefit from switching back to something a little bit more offloading from the cushion shoe he has right now this is more of a heel protector what I really need is a Prevalon offloading boot which I think is good to do a lot better for him than just the small heel protector. 09/23/2021 upon evaluation today patient appears to be doing a little better in regards to last week's visit. Overall I  think that he is making good progress which is great news and there does not appear to be any evidence of active infection at this time. No fevers, chills, nausea, vomiting, or diarrhea. 09/30/2021 upon evaluation patient's heel is actually showing signs of good improvement which is great news. Fortunately there does not appear to be any evidence of active infection locally nor systemically at this point. No fevers, chills, nausea, vomiting, or diarrhea. 10/07/2021 upon evaluation today patient appears to be doing well currently in regard to his heel ulcer. He has been tolerating the dressing changes without complication. Fortunately I do not see any signs of active infection at this time which is great news. No fevers, chills, nausea, vomiting, or diarrhea. 12/27; wound is measuring smaller. We have been using silver alginate 10/28/2021 upon evaluation today patient appears to be doing well currently in regard to his heel ulcer. I am actually very pleased with where things stand and I think he is making excellent progress. Fortunately I do not see any  signs of active infection locally nor systemically at this point which is great news. Nonetheless I do believe that the patient would benefit from a new offloading shoe and has been using it Velcro is no longer functioning properly and he tells me that he almost tripped and fell because of that today. Obviously I do not want him following that would be very bad. GARDNER, HEBEL ([id number]) [id number] Page 4 of 13 11/11/2021 upon evaluation today patient appears to be doing excellent in regard to his wound. In fact this appears to be completely healed based on what I see currently. I do not see any signs of anything open or draining and I did double check just by clearing away some of the callus around the edges of the wound to ensure that there was nothing still open or hiding underneath. Once this was cleared away it appeared that the patient was doing significantly better at this time which is great news and there was nothing actually open. Readmission: 06-19-2022 upon evaluation today patient appears to be doing well currently in regard to his heel ulcer all things considered. He unfortunately is having a bit of breakdown in general as far as the heel is concerned not nearly as bad as what we noted last time he was here in the clinic. The good news is I do think that this should hopefully heal much more rapidly than what we noted last time. His past medical history has not changed he is on Coumadin. 07-03-2022 upon evaluation today patient appears to be doing better in regard to his heel. We will start to see some improvement here which is good news. Fortunately I do not see any evidence of infection locally or systemically at this time which is great news. No fevers, chills, nausea, vomiting, or diarrhea. 07-10-2022 upon evaluation today patient appears to be doing well currently in regard to his wound from the callus standpoint around the edges. Unfortunately from an  actual wound bed standpoint he has some deep tissue injury noted at this point. Again I am not sure exactly what is going on that is causing this pressure to the region but he is definitely gotten pressure that is occurring and causing this to not heal as effectively as what we would like to see. I discussed with him today that he may need to at night when he sleeping use a pillow up underneath his calf in order to prevent the heel from touching the  bed at all. I also think that it may be beneficial for him to monitor throughout his day and make sure there is no other time when he is getting the pressure to the heel. 07-23-2022 upon evaluation today patient appears to be doing poorly currently in regard to his heel ulcer. He has been tolerating the dressing changes without complication. Unfortunately I feel like he may be infected based on what I am seeing. 07-28-2022 I did review patient's culture results which showed positive for Proteus as well as Staphylococcus both of which would be treated with the Bactrim DS that I gave him at the last visit. This is good news and hopefully means that we should be able to apply the cast today as long as the wound itself does not appear to be doing poorly. Patient's wound bed actually showed signs of doing quite well and I am very pleased with where things stand and I do not see any signs of worsening overall which is great news. 08-04-2022 upon evaluation today patient appears to be doing well currently in regard to his heel ulcer. I am actually very pleased with where things stand and I do think this is looking significantly better. With regard to his right shin that is also showing signs of improvement which is great news. 08-10-2022 upon evaluation today patient's wound on the heel actually appears to be doing significantly better. In regard to the right anterior shin this is showing signs of healing it might even be completely healed but I still get a monitor  I cannot get anything to fill away but also could not see where there was anything actually draining at this point. I am tending to think healed but I want a monitor 1 more week before I call it for sure. 08-17-2022 upon evaluation today patient appears to be doing well currently in regard to his heel. Fortunately he is tolerating the dressing changes without complication. I do not see any signs of infection and overall I think he is making good progress here. We did get approval for the Apligraf but I think he had a $295 co-pay that would have to be paid per application. With that being said as good as he is doing right now I do not think that is necessary but is still an option depending on how things progress if we need to speed this up quite a bit. 08/24/2022; this patient has a wound on his left heel in the setting of type 2 diabetes. We have been using Prisma. He has a heel offloading boot 08-31-2022 upon evaluation today patient appears to be doing poorly in regard to his heel ulcer which is still showing signs of bruising I am just not as happy as I was when I saw him 2 weeks ago with this. He saw Dr. Leanord Hawking last week. Also did not look good at that point apparently. Nonetheless I think that the patient is still continuing to have some counterpressure getting to the wound bed unfortunately. 09-07-2022 upon evaluation today patient appears to be doing well currently in regard to his heel ulcer. He has been tolerating the dressing changes without complication. Fortunately there does not appear to be any signs of active infection locally or systemically at this time. Fortunately I do not see any evidence of active infection at this time. 09-15-2022 upon evaluation today patient appears to be doing well currently in regard to his legs which in fact appear to be completely healed this is great news.  With that being said his heel does have some erythema and warmth as well as drainage I am concerned  about cellulitis at this location. 09-21-2022 upon evaluation today patient's wound is actually showing signs of being a little bit smaller but still we are not doing nearly as well as what I would like to see. Fortunately I do not see any signs of infection at this time which is great news and overall I am extremely pleased in that regard. With that being said I do think that he needs to continue to elevate his leg although the edema is under great control with a compression wrap I think switching to a different dressing topically may be beneficial. My suggestion is to switch him over to a Iodoflex dressing. 09-28-2022 upon evaluation today patient appears to be doing well with regard to his heel ulcer. This is actually showing signs of improvement which is great news and overall I am extremely pleased with where we stand today. 10-06-2022 upon evaluation today patient actually appears to be making excellent progress at this point. Fortunately there does not appear to be any signs of active infection locally nor systemically which is great news and overall I am extremely pleased with where we stand. I do feel like he is actually making some pretty good progress here as we switch to the wrap along with the Iodoflex. 12/26; patient has a wound on the tip of his left heel. This is not a weightbearing surface. He is using a heel offloading boot and carefully offloading this at other times during the day. He is using Iodoflex and Zetuvitunder 3 layer compression 10-22-2022 upon evaluation today patient appears to be doing well currently in regard to his wound. He has been tolerating the dressing changes without complication. Fortunately there does not appear to be any signs of active infection locally nor systemically at this time. No fevers, chills, nausea, vomiting, or diarrhea. 08-30-2023 upon evaluation today patient appears to be doing well currently in regard to his heel ulcer which is showing signs of  good improvement. Fortunately I do not see any evidence of infection locally nor systemically which is great news and overall I am extremely pleased with where we stand. No fevers, chills, nausea, vomiting, or diarrhea. Unfortunately he does have a wound on the side of his fifth toe where I think this did rub on the shoe. 11-05-2022 upon evaluation today patient still still showing some signs of pressure at this point and there may have even been some fluid collection at this time. Fortunately I do not see any evidence of active infection locally nor systemically which is great news and overall I am extremely pleased with where we stand. No fevers, chills, nausea, vomiting, or diarrhea. 1/25; the patient's area on the dorsal aspect of the left fifth toe is healed. We have been using Iodoflex to the area on the left heel. The lateral wound is slightly down in length. 11-19-2022 upon evaluation today patient appears to be doing well currently in regard to his wound. He has been tolerating the dressing changes without complication. Fortunately there does not appear to be any signs of infection we been using Iodoflex up to this point. 11-26-2022 upon evaluation today patient appears to be doing poorly currently in regard to his wound. He has been tolerating the dressing changes with the Iodoflex without complication. With that being said he does not have any signs of active infection systemically though locally I do feel like there is  some evidence of infection here. 12-03-2022 upon evaluation today patient appears to be doing well currently in regard to his wound this is better with the Bactrim compared to last week still there was some significant slough and biofilm buildup which is going require sharp debridement at this point. Fortunately I do not see any signs of active infection CUBA, SUMROW ([id number]) [phone].pdf Page 5 of 13 locally nor systemically which is great  news. 12-11-2022 upon evaluation today patient actually appears to be doing significantly better at this time in regard to his heel. I am actually very pleased with where things stand currently. There is no signs of active infection locally or systemically at this point. 12-17-2022 upon evaluation today patient appears to be doing better as far as the overall appearance of his wound. I am actually very pleased in that regard. Fortunately there does not appear to be any signs of active infection locally nor systemically at this time. I do feel like that the patient is showing signs of excellent improvement in general. Nonetheless he needs something better for compression I think he should go ahead and see about getting compression socks. 3/7; tip of the left heel measurements are smaller he has been using Hydrofera Blue 01-07-2023 upon evaluation today patient appears to be doing well currently in regard to his wound. This has not been debrided since I last saw him and it definitely shows that definitely needs some debridement today. Fortunately I do not see any evidence of active infection locally nor systemically which is great news. 01-14-2023 upon evaluation today patient appears to be doing well currently although he does have some erythema and warmth to the heel I feel like he may have developed an infection yet again. This is something that is been ongoing issue we have been trying to manage his foot best we could. With that being said I do think he may need to go back on the Bactrim this has done well for him in the past. 01-21-2023 upon evaluation today patient appears to be doing better in regard to his heel. He is doing very well with the antibiotics and that is made a big difference for him based on what I am seeing I am very pleased with where we stand today. He is obviously doing extremely well which is great news. 01-28-2023 upon evaluation today patient appears to be doing well currently in  regard to his wound. This is actually showing signs of excellent improvement I am actually very pleased with where we stand and I do believe that he is making good progress here. Fortunately I do not see any evidence of active infection locally nor systemically which is great news. No fevers, chills, nausea, vomiting, or diarrhea. 02-04-2023 upon evaluation today patient's wound is actually showed signs of excellent improvement I am actually very pleased with where we stand. I do not see any signs of active infection locally nor systemically which is great news and in general I do believe that we are moving in the right direction here. No fevers, chills, nausea, vomiting, or diarrhea. 02-11-2023 upon evaluation patient's wound actually showing signs of excellent improvement I am actually very pleased with where we stand I think that his pain is better there is no signs of infection we will continue to use the gentamicin and overall we are making excellent progress. 02-18-2023 upon evaluation today patient appears to be doing well currently in regard to his wound. He has been tolerating the dressing changes without  complication. Fortunately I do not see any signs of active infection locally nor systemically which is great news. 02-25-2023 upon evaluation today patient's heel actually showed signs of excellent improvement. I am actually very pleased with where we stand today and I feel like that he is making really good progress. Fortunately I do not see any signs of active infection which is good news. No fevers, chills, nausea, vomiting, or diarrhea. 03-04-2023 upon evaluation today patient appears to be doing pretty well currently in regard to his heel. He has been tolerating the dressing changes without complication. Fortunately there does not appear to be any signs of active infection locally nor systemically which is great news. No fevers, chills, nausea, vomiting, or diarrhea. 03-11-2023 upon evaluation  today patient appears to be doing poorly in regard to his heel ulcer. He tells me he has been having to sleep on his back to having trouble breathing therefore I think he has been having some issues here as far as the heel is concerned it looks like he has a deep tissue injury yet again at this point. Fortunately I do not see any signs of infection which is good news but at the same time I do feel like that he is doing worse than where he was previous. I do not see any signs of active infection systemically which is great news. 03-18-2023 upon evaluation today patient appears to be doing well currently in fact the heel is significantly better compared to last week. I am extremely pleased with where we are seeing I am hopeful that he will continue to show signs of improvement going forward. 03-25-2023 upon evaluation today patient appears to be doing well currently in regard to his wounds. He is actually showing signs of improvement on the heel and I am very pleased in that regard. Fortunately I do not see any signs of active infection at this time which is great news. No fevers, chills, nausea, vomiting, or diarrhea. 04-05-2023 upon evaluation today patient's wound is actually showing signs of good improvement. Actually very pleased with where things stand today. Fortunately there does not appear to be any signs of active infection locally or systemically which is great news. 04-19-2023 upon evaluation today patient's wound actually looks the best it has in quite some time. He did have a wrap on for 2 weeks which I think is caused a little bit of irritation going to the forefoot everything else looks to be doing well the wrap did slide down a little bit as well. Fortunately I do not see any signs of infection however I do think that in the future if he has any issues with not being able to get scheduled to see me he should definitely make an appointment at least for a nurse visit he is aware and in agreement  with plan going forward. 04-26-2023 upon evaluation today patient's wound is actually significantly smaller this is great news he is making good progress in the past really 5 or 6 visits we have been seeing improvement week by week I am very pleased in that regard. 05-03-2023 upon evaluation today patient appears to be doing excellent in regard to the wound on his heel. This is actually showing signs of improvement he continues to make good progress towards complete closure which is great news. 7/23; wound continues to do nicely. Smaller with healthy epithelialization. This is actually a reopening we had already healed this area once and we discussed this this visit. 05-18-2023 upon evaluation today  patient's wound actually showing signs of improvement. Fortunately I do not see any signs of active infection locally or systemically which is great news. No fevers, chills, nausea, vomiting, or diarrhea. 05-24-2023 upon evaluation today patient's wound on the heel appears to be doing quite well. Fortunately there does not appear to be any signs of active infection at this time which is great news. Fortunately I think that he is making excellent headway with the current treatment regimen. 06-01-2023 upon evaluation today patient appears to be doing excellent in regard to his wound on the heel this is actually showing signs of being smaller measuring better and looking like is doing quite well. Fortunately I do not see any signs of infection at this time. He does have a small wound on the leg which we can also need to address this looks like it was just a small blister that ruptured I think it should heal pretty quickly. 06-07-2023 upon evaluation today patient appears to be doing well currently in regard to his wound. He has been tolerating the dressing changes without complication. Fortunately there does not appear to be any signs of active infection locally or systemically at this time. No fevers, chills,  nausea, vomiting, or diarrhea. 8/26; wound near the tip of the left heel. He is a diabetic. We have been using Hydrofera Blue under Urgo K2 lite wrap he has been in his surgical shoe although he tells me it disintegrated and he came in on a regular running shoe today. 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 He is not wearing compression stocking JOCQUI, DIXON ([id number]) [phone] Page 6 of 13 06-22-2023 upon evaluation today patient appears to be doing excellent in regard to his wound on the heel which is actually significantly smaller compared to last week's evaluation. Fortunately I do not see any signs of active infection locally or systemically at this time. Electronic Signature(s) Signed: 06/22/2023 2:42:03 PM By: [name] Derry PA-C Entered By: [name] Derry on 06/22/2023 14:42:03 -------------------------------------------------------------------------------- Physical Exam Details Patient Name: Date of Service: [name], [name] 06/22/2023 1:45 PM Medical Record Number: [id number] Patient Account Number: [id number] Date of Birth/Sex: Treating RN: [birth year] (77 y.o. [name] Primary Care Provider: [name] Other Clinician: Betha Loa Referring Provider: Treating Provider/Extender: Hermine Messick Weeks in Treatment: 67 Constitutional Well-nourished and well-hydrated in no acute distress. Respiratory normal breathing without difficulty. Psychiatric this patient is able to make decisions and demonstrates good insight into disease process. Alert and Oriented x 3. pleasant and cooperative. Notes Upon inspection patient's wound bed actually showed signs of good granulation and epithelization at this point. I feel like that he is actually making excellent progress towards closure. I did perform some debridement away necrotic debris today and he tolerated that without complication. Postdebridement the  wound bed is significantly improved compared to last time I saw him. I removed callus as well as slough and biofilm down to good subcutaneous tissue. Electronic Signature(s) Signed: 06/22/2023 2:42:50 PM By: [name] Derry PA-C Entered By: [name] Derry on 06/22/2023 14:42:49 -------------------------------------------------------------------------------- Physician Orders Details Patient Name: Date of Service: [name], [name] 06/22/2023 1:45 PM Medical Record Number: [id number] Patient Account Number: [id number] Date of Birth/Sex: Treating RN: 26-Feb-1946 (77 y.o. [name] Primary Care Provider: [name] Other Clinician: Betha Loa Referring Provider: Treating Provider/Extender: [name] in Treatment: 9 Verbal / Phone Orders: Yes Clinician: Midge Aver Read Back and Verified:  Yes Diagnosis [address] 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 Bathing/ Shower/ Hygiene May shower; gently cleanse wound with antibacterial soap, rinse and pat dry prior to dressing wounds Off-Loading Open toe surgical shoe Wound Treatment Wound #10 - Calcaneus Wound Laterality: Left Topical: Mupirocin Ointment 1 x Per Week/30 Days Discharge Instructions: Apply as directed by provider. Prim Dressing: Hydrofera Blue Ready Transfer Foam, 2.5x2.5 (in/in) (Dispense As Written) 1 x Per Week/30 Days ary Discharge Instructions: Apply Hydrofera Blue Ready to wound bed as directed Secondary Dressing: ABD Pad 5x9 (in/in) 1 x Per Week/30 Days Discharge Instructions: Cover with ABD pad Compression Wrap: Urgo K2 Lite, two layer  compression system, regular 1 x Per Week/30 Days Electronic Signature(s) Signed: 06/22/2023 5:22:58 PM By: Betha Loa Signed: 06/25/2023 2:02:35 PM By: [name] Derry PA-C Entered By: Betha Loa on 06/22/2023 14:38:24 -------------------------------------------------------------------------------- Problem List Details Patient Name: Date of Service: [name], [name] 06/22/2023 1:45 PM Medical Record Number: [id number] Patient Account Number: [id number] Date of Birth/Sex: Treating RN: 1946-04-10 (77 y.o. [name] Primary Care Provider: [name] Other Clinician: Betha Loa Referring Provider: Treating Provider/Extender: Hermine Messick Weeks in Treatment: 26 Active Problems ICD-10 Encounter Code Description Active Date MDM Diagnosis E11.621 Type 2 diabetes mellitus with foot ulcer 06/19/2022 No Yes 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sed 06/19/2022 No Yes E11.42 Type 2 diabetes mellitus with diabetic polyneuropathy 06/19/2022 No Yes KHALEED, SIPE ([id number]) [phone].pdf Page 8 of 13 Inactive Problems Resolved Problems Electronic Signature(s) Signed: 06/22/2023 2:14:47 PM By: [name] Derry PA-C Entered By: [name] Derry on 06/22/2023 14:14:46 -------------------------------------------------------------------------------- Progress Note Details Patient Name: Date of Service: [name], [name] 06/22/2023 1:45 PM Medical Record Number: [id number] Patient Account Number: [id number] Date of Birth/Sex: Treating RN: 1946-04-03 (78 y.o. [name] Primary Care Provider: [name] Other Clinician: Betha Loa Referring Provider: Treating Provider/Extender: Hermine Messick Weeks in Treatment: 2 Subjective Chief Complaint Information obtained from Patient Left heel ulcer History of Present Illness (HPI) 09/24/2020 on evaluation today patient presents today for a  heel ulcer that he tells me has been present for about 2 years. He has been seeing podiatry and they have been attempting to manage this including what sounds to be a total contact cast, Unna boot, and just standard dressings otherwise as well. Most recently has been using triple antibiotic ointment. With that being said unfortunately despite everything he really has not had any significant improvement. He tells me that he cannot even really remember exactly how this began but he presumed it may have rubbed on his shoes or something of that nature. 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 He tells me that this does seem to help. Fortunately there is no signs of active infection at this time. His most recent hemoglobin A1c was 8.1 though he knows this was this year he cannot tell me the exact  time. His fluid pills currently to help with some of the lower extremity edema although he does obviously have signs of venous stasis/lymphedema. Currently there is no evidence of active infection. No fevers, chills, nausea, vomiting, or diarrhea. Patient has had fairly recent ABIs which were performed on 07/19/2020 and revealed that he has normal findings in both the ankle and toe locations bilaterally. His ABI on the right was 1.09 on the left was 1.08 with a TBI on the right of 0.88 and on the left of 0.94. Triphasic flow was noted throughout. 10/08/2020 on evaluation today patient appears to be doing pretty well in regard to his left heel currently in fact this is doing a great job and seems to be healing quite nicely. Unfortunately on his right leg he had a pile of wood that actually fell on him injuring his right leg this is  somewhat erythematous has me concerned little bit about cellulitis though there is not really a good area to culture at this point. 10/24/2020 upon evaluation today patient appears to be doing well with regard to his heel ulcer. He is showing signs of improvement which is great news. His right leg is completely healed. Overall I feel like he is doing excellent and there is no signs of infection. 11/07/2020 upon evaluation today patient appears to be doing well with regard to his heel ulcer. He tells me that last week when he was unable to come in his wife actually thought that the wound was very close to closing if not closed. Then it began to "reopen again". I really feel like what may have happened as the collagen may have dried over the wound bed and that because that misunderstanding with thinking that the wound was healing. With that being said I did not see it last week I do not know that for certain. Either way I feel like he is doing great today I see no signs of infection at this point. 11/26/2020 upon inspection today patient appears to be doing decently well in regard to his heel ulcer. He has been tolerating the dressing changes without complication. Fortunately there is no sign of active infection at this time. No fevers, chills, nausea, vomiting, or diarrhea. 12/10/2020 upon evaluation today patient appears to be doing fairly well in regard to the wound on his heel as well as what appears to be a new wound of the left first metatarsal head plantar aspect. This seems to be an area that was callus that has split as the patient tells me has been trying to walk on his toes more has probably where this came from. With that being said there does not appear to be signs of active infection which is great news. 12/17/2020 upon evaluation today patient actually appears to be making good progress currently. Fortunately there is no evidence of active infection at this time. Overall I feel like he is very  close to complete closure. 12/26/2020 upon evaluation today patient appears to be doing excellent in regard to his wounds. In fact I am not certain that these are not even completely healed on initial inspection. Overall I am very pleased with where things stand today. Good news is that they are healed he is actually get ready to go out of town and that will be helpful as well as he will be a full part of the time. Readmission: 02/04/2021 upon evaluation today patient appears to be doing well at this point in regard to his left heel that  I previously saw him for. Unfortunately he is having MARQUIE, PEREL ([id number]) [id number] Page 9 of 13 issues with his right lower extremity. He has significant wounds at this point he also has erythema noted there is definite signs of cellulitis which is unfortunate. With that being said I think we do need to address this sooner rather than later. The good news is he did have a nice trip to the beach. He tells me that he had no issues during that time. 4/27; patient on Bactrim. Culture showed methicillin sensitive staph aureus therefore the Bactrim should be effective. 2 small areas on the right leg are healed the area on the mid aspect of the tibia almost 100% covered in a very adherent necrotic debris. We have been using silver alginate 02/20/2021 upon evaluation today patient appears to be doing well with regard to his wound. He is showing signs of improvement which is great news overall very pleased with where things stand today. No fevers, chills, nausea, vomiting, or diarrhea. 02/27/2021 upon evaluation today patient appears to be doing well with regard to his leg ulcer. He is tolerating the dressing changes and overall appears to be doing quite excellent. I am extremely pleased with where things stand and overall I think patient is making great progress. There is no sign of active infection at this time which is also great  news. 03/06/2021 upon evaluation today patient appears to be doing excellent in regard to his wounds. In fact he appears to be completely healed today based on what I am seeing. This is excellent news and overall I am extremely pleased with where he stands. Overall the patient is happy to hear this as well this has been quite sometime coming. Readmission: 04/01/2021 patient unfortunately returns for readmission today. He tells me that he has been wearing his compression socks on the right leg daily and he does not really know what is going on and why his legs are doing what they are doing. We did therefore go ahead and probe deeper into exactly what has been going on with him today. Subsequently the patient tells me that when he gets up and what we would call "first thing in the morning" are really 2 different things". He does not tend to sleep well so he tells me that he will often wake up at 3:00 in the morning. He will then potentially going into the living room to get in his chair where he may read a book for a little while and then potentially fall asleep back in his chair. He then subsequently wake up around 5:00 or so and then get up and in his words "putter around". This often will end with him proceeding at some point around 8 AM or 9 AM to putting on his compression socks. With that being said this means that anywhere from the 3:00 in the morning till roughly around 9:00 in the morning he has no compression on yet he is sitting in his recliner, walking around and up and about, and this is at least 5 to 6 hours of noncompressed time. That may be our issue here but I did not realize until we discussed this further today. Fortunately there does not appear to be any signs of active infection at this time which is great news. With that being said he has multiple wounds of the bilateral lower extremities. 04/10/2021 upon evaluation today patient appears to be doing well with regard to his legs for  the most part.  He does look like he had some injury where his wrap slid down nonetheless he did some "doctoring on them". I do feel like most of the areas honestly have cleared back up which is great news. There does not appear to be any signs of active infection at this time which is also great news. No fevers, chills, nausea, vomiting, or diarrhea. 04/18/2021 upon evaluation today patient appears to be doing well with regard to his wounds in fact he appears to be completely healed which is great news. Fortunately there does not appear to be any signs of active infection which is great news. No fevers, chills, nausea, vomiting, or diarrhea. READMISSION 07/28/2021 This is a now 77 year old man we have had in this clinic for quite a bit of this year. 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 He wears compression stockings. 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 He comes in an old running shoes although he says he wears better shoes at home. His history is that he noticed a blister in the right posterior heel. This open. He has been applying Neosporin to it currently the wound measures 2 x 1.4 cm. 100% slough covered. Not really offloading this in any rigorous way. Past medical history is essentially unchanged he has paroxysmal atrial fibrillation on Coumadin type 2 diabetes with a recent hemoglobin A1c of 7 on metformin. ABI in our clinic was 0.98 on the right 08/05/2021 upon evaluation today patient appears to be doing well with regard to his heel ulcer. Fortunately there does not appear to be any signs of active infection at this time. Overall I been very pleased with where things seem to be currently as far as the wound healing is concerned. Again this is first a lot of seeing him Dr. Leanord Hawking readmitted him last week. Nonetheless I think we are definitely making some progress  here. 08/11/2021 upon evaluation today patient's wound on the heel actually showing signs of excellent improvement. I am actually very pleased with where things stand currently. No fevers, chills, nausea, vomiting, or diarrhea. There does not appear to be any need for sharp debridement today either which is also great news. 08/25/2021 upon evaluation today patient appears to be doing well with regard to his heel ulcer. This is actually looking significantly improved compared to last time I saw him. Fortunately there does not appear to be any evidence of active infection at this time. No fevers, chills, nausea, vomiting, or diarrhea. 09/01/2021 upon evaluation today patient appears to be doing decently well in regard to his wounds. Fortunately there does not appear to be any signs of active infection at this time which is great news and overall very pleased in that regard. I do not see any evidence of active infection systemically which is great news as well. No fevers, chills, nausea, vomiting, or diarrhea. I think that the heel is making excellent progress. 09/15/2021 upon evaluation today patient's heel unfortunately is significantly worse compared to what it was previous. I do believe that at this time he would benefit from switching back to something a little bit more offloading from the cushion shoe he has right now this is more of a heel protector what I really need is a Prevalon offloading boot which I think is good to do a lot better for him than just the small heel protector. 09/23/2021 upon evaluation today patient appears to be doing a little  better in regards to last week's visit. Overall I think that he is making good progress which is great news and there does not appear to be any evidence of active infection at this time. No fevers, chills, nausea, vomiting, or diarrhea. 09/30/2021 upon evaluation patient's heel is actually showing signs of good improvement which is great news. Fortunately  there does not appear to be any evidence of active infection locally nor systemically at this point. No fevers, chills, nausea, vomiting, or diarrhea. 10/07/2021 upon evaluation today patient appears to be doing well currently in regard to his heel ulcer. He has been tolerating the dressing changes without complication. Fortunately I do not see any signs of active infection at this time which is great news. No fevers, chills, nausea, vomiting, or diarrhea. 12/27; wound is measuring smaller. We have been using silver alginate 10/28/2021 upon evaluation today patient appears to be doing well currently in regard to his heel ulcer. I am actually very pleased with where things stand and I think he is making excellent progress. Fortunately I do not see any signs of active infection locally nor systemically at this point which is great news. Nonetheless I do believe that the patient would benefit from a new offloading shoe and has been using it Velcro is no longer functioning properly and he tells me that he almost tripped and fell because of that today. Obviously I do not want him following that would be very bad. 11/11/2021 upon evaluation today patient appears to be doing excellent in regard to his wound. In fact this appears to be completely healed based on what I see currently. I do not see any signs of anything open or draining and I did double check just by clearing away some of the callus around the edges of the wound to ensure that there was nothing still open or hiding underneath. Once this was cleared away it appeared that the patient was doing significantly better at this time which is great news and there was nothing actually open. ANOOP, BISSETT ([id number]) [id number] Page 10 of 13 Readmission: 06-19-2022 upon evaluation today patient appears to be doing well currently in regard to his heel ulcer all things considered. He unfortunately is having a bit of breakdown  in general as far as the heel is concerned not nearly as bad as what we noted last time he was here in the clinic. The good news is I do think that this should hopefully heal much more rapidly than what we noted last time. His past medical history has not changed he is on Coumadin. 07-03-2022 upon evaluation today patient appears to be doing better in regard to his heel. We will start to see some improvement here which is good news. Fortunately I do not see any evidence of infection locally or systemically at this time which is great news. No fevers, chills, nausea, vomiting, or diarrhea. 07-10-2022 upon evaluation today patient appears to be doing well currently in regard to his wound from the callus standpoint around the edges. Unfortunately from an actual wound bed standpoint he has some deep tissue injury noted at this point. Again I am not sure exactly what is going on that is causing this pressure to the region but he is definitely gotten pressure that is occurring and causing this to not heal as effectively as what we would like to see. I discussed with him today that he may need to at night when he sleeping use a pillow up underneath his calf  in order to prevent the heel from touching the bed at all. I also think that it may be beneficial for him to monitor throughout his day and make sure there is no other time when he is getting the pressure to the heel. 07-23-2022 upon evaluation today patient appears to be doing poorly currently in regard to his heel ulcer. He has been tolerating the dressing changes without complication. Unfortunately I feel like he may be infected based on what I am seeing. 07-28-2022 I did review patient's culture results which showed positive for Proteus as well as Staphylococcus both of which would be treated with the Bactrim DS that I gave him at the last visit. This is good news and hopefully means that we should be able to apply the cast today as long as the wound itself  does not appear to be doing poorly. Patient's wound bed actually showed signs of doing quite well and I am very pleased with where things stand and I do not see any signs of worsening overall which is great news. 08-04-2022 upon evaluation today patient appears to be doing well currently in regard to his heel ulcer. I am actually very pleased with where things stand and I do think this is looking significantly better. With regard to his right shin that is also showing signs of improvement which is great news. 08-10-2022 upon evaluation today patient's wound on the heel actually appears to be doing significantly better. In regard to the right anterior shin this is showing signs of healing it might even be completely healed but I still get a monitor I cannot get anything to fill away but also could not see where there was anything actually draining at this point. I am tending to think healed but I want a monitor 1 more week before I call it for sure. 08-17-2022 upon evaluation today patient appears to be doing well currently in regard to his heel. Fortunately he is tolerating the dressing changes without complication. I do not see any signs of infection and overall I think he is making good progress here. We did get approval for the Apligraf but I think he had a $295 co-pay that would have to be paid per application. With that being said as good as he is doing right now I do not think that is necessary but is still an option depending on how things progress if we need to speed this up quite a bit. 08/24/2022; this patient has a wound on his left heel in the setting of type 2 diabetes. We have been using Prisma. He has a heel offloading boot 08-31-2022 upon evaluation today patient appears to be doing poorly in regard to his heel ulcer which is still showing signs of bruising I am just not as happy as I was when I saw him 2 weeks ago with this. He saw Dr. Leanord Hawking last week. Also did not look good at that  point apparently. Nonetheless I think that the patient is still continuing to have some counterpressure getting to the wound bed unfortunately. 09-07-2022 upon evaluation today patient appears to be doing well currently in regard to his heel ulcer. He has been tolerating the dressing changes without complication. Fortunately there does not appear to be any signs of active infection locally or systemically at this time. Fortunately I do not see any evidence of active infection at this time. 09-15-2022 upon evaluation today patient appears to be doing well currently in regard to his legs which in  fact appear to be completely healed this is great news. With that being said his heel does have some erythema and warmth as well as drainage I am concerned about cellulitis at this location. 09-21-2022 upon evaluation today patient's wound is actually showing signs of being a little bit smaller but still we are not doing nearly as well as what I would like to see. Fortunately I do not see any signs of infection at this time which is great news and overall I am extremely pleased in that regard. With that being said I do think that he needs to continue to elevate his leg although the edema is under great control with a compression wrap I think switching to a different dressing topically may be beneficial. My suggestion is to switch him over to a Iodoflex dressing. 09-28-2022 upon evaluation today patient appears to be doing well with regard to his heel ulcer. This is actually showing signs of improvement which is great news and overall I am extremely pleased with where we stand today. 10-06-2022 upon evaluation today patient actually appears to be making excellent progress at this point. Fortunately there does not appear to be any signs of active infection locally nor systemically which is great news and overall I am extremely pleased with where we stand. I do feel like he is actually making some pretty good  progress here as we switch to the wrap along with the Iodoflex. 12/26; patient has a wound on the tip of his left heel. This is not a weightbearing surface. He is using a heel offloading boot and carefully offloading this at other times during the day. He is using Iodoflex and Zetuvitunder 3 layer compression 10-22-2022 upon evaluation today patient appears to be doing well currently in regard to his wound. He has been tolerating the dressing changes without complication. Fortunately there does not appear to be any signs of active infection locally nor systemically at this time. No fevers, chills, nausea, vomiting, or diarrhea. 08-30-2023 upon evaluation today patient appears to be doing well currently in regard to his heel ulcer which is showing signs of good improvement. Fortunately I do not see any evidence of infection locally nor systemically which is great news and overall I am extremely pleased with where we stand. No fevers, chills, nausea, vomiting, or diarrhea. Unfortunately he does have a wound on the side of his fifth toe where I think this did rub on the shoe. 11-05-2022 upon evaluation today patient still still showing some signs of pressure at this point and there may have even been some fluid collection at this time. Fortunately I do not see any evidence of active infection locally nor systemically which is great news and overall I am extremely pleased with where we stand. No fevers, chills, nausea, vomiting, or diarrhea. 1/25; the patient's area on the dorsal aspect of the left fifth toe is healed. We have been using Iodoflex to the area on the left heel. The lateral wound is slightly down in length. 11-19-2022 upon evaluation today patient appears to be doing well currently in regard to his wound. He has been tolerating the dressing changes without complication. Fortunately there does not appear to be any signs of infection we been using Iodoflex up to this point. 11-26-2022 upon  evaluation today patient appears to be doing poorly currently in regard to his wound. He has been tolerating the dressing changes with the Iodoflex without complication. With that being said he does not have any signs of active  infection systemically though locally I do feel like there is some evidence of infection here. 12-03-2022 upon evaluation today patient appears to be doing well currently in regard to his wound this is better with the Bactrim compared to last week still there was some significant slough and biofilm buildup which is going require sharp debridement at this point. Fortunately I do not see any signs of active infection locally nor systemically which is great news. 12-11-2022 upon evaluation today patient actually appears to be doing significantly better at this time in regard to his heel. I am actually very pleased with where things stand currently. There is no signs of active infection locally or systemically at this point. HARSHAAN, FRAKES ([id number]) [id number] Page 11 of 13 12-17-2022 upon evaluation today patient appears to be doing better as far as the overall appearance of his wound. I am actually very pleased in that regard. Fortunately there does not appear to be any signs of active infection locally nor systemically at this time. I do feel like that the patient is showing signs of excellent improvement in general. Nonetheless he needs something better for compression I think he should go ahead and see about getting compression socks. 3/7; tip of the left heel measurements are smaller he has been using Hydrofera Blue 01-07-2023 upon evaluation today patient appears to be doing well currently in regard to his wound. This has not been debrided since I last saw him and it definitely shows that definitely needs some debridement today. Fortunately I do not see any evidence of active infection locally nor systemically which is great news. 01-14-2023  upon evaluation today patient appears to be doing well currently although he does have some erythema and warmth to the heel I feel like he may have developed an infection yet again. This is something that is been ongoing issue we have been trying to manage his foot best we could. With that being said I do think he may need to go back on the Bactrim this has done well for him in the past. 01-21-2023 upon evaluation today patient appears to be doing better in regard to his heel. He is doing very well with the antibiotics and that is made a big difference for him based on what I am seeing I am very pleased with where we stand today. He is obviously doing extremely well which is great news. 01-28-2023 upon evaluation today patient appears to be doing well currently in regard to his wound. This is actually showing signs of excellent improvement I am actually very pleased with where we stand and I do believe that he is making good progress here. Fortunately I do not see any evidence of active infection locally nor systemically which is great news. No fevers, chills, nausea, vomiting, or diarrhea. 02-04-2023 upon evaluation today patient's wound is actually showed signs of excellent improvement I am actually very pleased with where we stand. I do not see any signs of active infection locally nor systemically which is great news and in general I do believe that we are moving in the right direction here. No fevers, chills, nausea, vomiting, or diarrhea. 02-11-2023 upon evaluation patient's wound actually showing signs of excellent improvement I am actually very pleased with where we stand I think that his pain is better there is no signs of infection we will continue to use the gentamicin and overall we are making excellent progress. 02-18-2023 upon evaluation today patient appears to be doing well currently in regard to his  wound. He has been tolerating the dressing changes without complication. Fortunately I do not  see any signs of active infection locally nor systemically which is great news. 02-25-2023 upon evaluation today patient's heel actually showed signs of excellent improvement. I am actually very pleased with where we stand today and I feel like that he is making really good progress. Fortunately I do not see any signs of active infection which is good news. No fevers, chills, nausea, vomiting, or diarrhea. 03-04-2023 upon evaluation today patient appears to be doing pretty well currently in regard to his heel. He has been tolerating the dressing changes without complication. Fortunately there does not appear to be any signs of active infection locally nor systemically which is great news. No fevers, chills, nausea, vomiting, or diarrhea. 03-11-2023 upon evaluation today patient appears to be doing poorly in regard to his heel ulcer. He tells me he has been having to sleep on his back to having trouble breathing therefore I think he has been having some issues here as far as the heel is concerned it looks like he has a deep tissue injury yet again at this point. Fortunately I do not see any signs of infection which is good news but at the same time I do feel like that he is doing worse than where he was previous. I do not see any signs of active infection systemically which is great news. 03-18-2023 upon evaluation today patient appears to be doing well currently in fact the heel is significantly better compared to last week. I am extremely pleased with where we are seeing I am hopeful that he will continue to show signs of improvement going forward. 03-25-2023 upon evaluation today patient appears to be doing well currently in regard to his wounds. He is actually showing signs of improvement on the heel and I am very pleased in that regard. Fortunately I do not see any signs of active infection at this time which is great news. No fevers, chills, nausea, vomiting, or diarrhea. 04-05-2023 upon evaluation  today patient's wound is actually showing signs of good improvement. Actually very pleased with where things stand today. Fortunately there does not appear to be any signs of active infection locally or systemically which is great news. 04-19-2023 upon evaluation today patient's wound actually looks the best it has in quite some time. He did have a wrap on for 2 weeks which I think is caused a little bit of irritation going to the forefoot everything else looks to be doing well the wrap did slide down a little bit as well. Fortunately I do not see any signs of infection however I do think that in the future if he has any issues with not being able to get scheduled to see me he should definitely make an appointment at least for a nurse visit he is aware and in agreement with plan going forward. 04-26-2023 upon evaluation today patient's wound is actually significantly smaller this is great news he is making good progress in the past really 5 or 6 visits we have been seeing improvement week by week I am very pleased in that regard. 05-03-2023 upon evaluation today patient appears to be doing excellent in regard to the wound on his heel. This is actually showing signs of improvement he continues to make good progress towards complete closure which is great news. 7/23; wound continues to do nicely. Smaller with healthy epithelialization. This is actually a reopening we had already healed this area once and  we discussed this this visit. 05-18-2023 upon evaluation today patient's wound actually showing signs of improvement. Fortunately I do not see any signs of active infection locally or systemically which is great news. No fevers, chills, nausea, vomiting, or diarrhea. 05-24-2023 upon evaluation today patient's wound on the heel appears to be doing quite well. Fortunately there does not appear to be any signs of active infection at this time which is great news. Fortunately I think that he is making excellent  headway with the current treatment regimen. 06-01-2023 upon evaluation today patient appears to be doing excellent in regard to his wound on the heel this is actually showing signs of being smaller measuring better and looking like is doing quite well. Fortunately I do not see any signs of infection at this time. He does have a small wound on the leg which we can also need to address this looks like it was just a small blister that ruptured I think it should heal pretty quickly. 06-07-2023 upon evaluation today patient appears to be doing well currently in regard to his wound. He has been tolerating the dressing changes without complication. Fortunately there does not appear to be any signs of active infection locally or systemically at this time. No fevers, chills, nausea, vomiting, or diarrhea. 8/26; wound near the tip of the left heel. He is a diabetic. We have been using Hydrofera Blue under Urgo K2 lite wrap he has been in his surgical shoe although he tells me it disintegrated and he came in on a regular running shoe today. 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 He is not wearing compression stocking 06-22-2023 upon evaluation today patient appears to be doing excellent in regard to his wound on the heel which is actually significantly smaller compared to last week's evaluation. Fortunately I do not see any signs of active infection locally or systemically at this time. CHARBEL, MCCLEAF ([id number]) [id number] Page 12 of 13 Objective Constitutional Well-nourished and well-hydrated in no acute distress. Vitals Time Taken: 2:06 PM, Height: 70 in, Weight: 265 lbs, BMI: 38, Temperature: 99.2 F, Pulse: 72 bpm, Respiratory Rate: 18 breaths/min, Blood Pressure: 111/62 mmHg. Respiratory normal breathing without difficulty. Psychiatric this patient is able to make decisions and demonstrates good insight into disease process. Alert and Oriented x  3. pleasant and cooperative. General Notes: Upon inspection patient's wound bed actually showed signs of good granulation and epithelization at this point. I feel like that he is actually making excellent progress towards closure. I did perform some debridement away necrotic debris today and he tolerated that without complication. Postdebridement the wound bed is significantly improved compared to last time I saw him. I removed callus as well as slough and biofilm down to good subcutaneous tissue. Integumentary (Hair, Skin) Wound #10 status is Open. Original cause of wound was Gradually Appeared. The date acquired was: 06/05/2022. The wound has been in treatment 52 weeks. The wound is located on the Left Calcaneus. The wound measures 0.4cm length x 0.6cm width x 0.2cm depth; 0.188cm^2 area and 0.038cm^3 volume. There is Fat Layer (Subcutaneous Tissue) exposed. There is a medium amount of serosanguineous drainage noted. The wound margin is flat and intact. There is small (1-33%) pink granulation within the wound bed. There is a small (1-33%) amount of necrotic tissue within the wound bed including Adherent Slough. 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 There was a Excisional Skin/Subcutaneous Tissue Debridement with a total area of 0.19 sq cm performed by [name] Derry, PA-C. With the following instrument(s): Curette to remove Viable and Non-Viable tissue/material. Material removed includes Callus, Subcutaneous Tissue, and Slough. A time out was conducted at 14:31, prior to the start of the procedure. A Minimum amount of bleeding was  controlled with Pressure. The procedure was tolerated well. Post Debridement Measurements: 0.4cm length x 0.6cm width x 0.2cm depth; 0.038cm^3 volume. Character of Wound/Ulcer Post Debridement is stable. Severity of Tissue Post Debridement is: Fat layer exposed. 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 There was a Double Layer Compression Therapy Procedure with a pre-treatment ABI of 1 by Betha Loa. Post procedure Diagnosis Wound #10: Same as Pre-Procedure Plan Follow-up Appointments: Wound #10 Left Calcaneus: Return Appointment in 1 week. Bathing/ Shower/ Hygiene: May shower; gently cleanse wound with antibacterial soap, rinse and pat dry prior to dressing wounds Off-Loading: Open toe surgical shoe WOUND #10: - Calcaneus Wound Laterality: Left Topical: Mupirocin Ointment 1 x Per Week/30 Days Discharge Instructions: Apply as directed by provider. Prim Dressing: Hydrofera Blue Ready Transfer Foam, 2.5x2.5 (in/in) (Dispense As Written) 1 x Per Week/30 Days ary Discharge Instructions: Apply Hydrofera Blue Ready to wound bed as directed Secondary Dressing: ABD Pad 5x9 (in/in) 1 x Per Week/30 Days Discharge Instructions: Cover with ABD pad ROYEL, BORSTAD ([id number]) [phone].pdf Page 13 of 13 Compression Wrap: Urgo K2 Lite, two layer compression system, regular 1 x Per Week/30 Days 1. Based on what I am seeing I am going to recommend that the patient should continue currently with wound care measures as before and he is in agreement with the plan. This includes the use of the [facility] after applying mupirocin ointment topically to the wound. 2. I would recommend ABD pad to cover followed by the Urgo K2 lite compression wrap. We will see patient back for reevaluation in 1 week here in the clinic. If anything worsens or  changes patient will contact our office for additional recommendations. Electronic Signature(s) Signed: 06/22/2023 2:43:40 PM By: [name] Derry PA-C Entered By: [name] Derry on 06/22/2023 14:43:40 -------------------------------------------------------------------------------- SuperBill Details Patient Name: Date of Service: [name], [name] 06/22/2023 Medical Record Number: [id number] Patient Account Number: [id number] Date of Birth/Sex: Treating RN: [birth year] (77 y.o. 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

## 2023-06-23 DIAGNOSIS — R42 Dizziness and giddiness: Secondary | ICD-10-CM | POA: Diagnosis not present

## 2023-06-23 NOTE — Progress Notes (Signed)
[name], [name] ([id number]) [id number] Page 1 of 9 Visit Report for 06/22/2023 Arrival Information Details Patient Name: Date of Service: [name], GEO [facility] J. 06/22/2023 1:45 PM Medical Record Number: [id number] Patient Account Number: [id number] Date of Birth/Sex: Treating RN: 1946-05-26 (77 y.o. [name] Primary Care Lyric Rossano: [name] Other Clinician: Betha Loa Referring Eustolia Drennen: Treating Nikisha Fleece/Extender: [name] in Treatment: 98 Visit Information History Since Last Visit All ordered tests and consults were completed: No Patient Arrived: [name] Added or deleted any medications: No Arrival Time: 13:51 Any new allergies or adverse reactions: No Transfer Assistance: None Had a fall or experienced change in Yes Patient Identification Verified: Yes activities of daily living that may affect Secondary Verification Process Completed: Yes risk of falls: Patient Requires Transmission-Based Precautions: No Signs or symptoms of abuse/neglect since last visito No Patient Has Alerts: Yes Hospitalized since last visit: No Patient Alerts: Patient on Blood Thinner Implantable device outside of the clinic excluding No Warfarin cellular tissue based products placed in the center Type II Diabetic since last visit: Has Dressing in Place as Prescribed: Yes Has Compression in Place as Prescribed: Yes Has Footwear/Offloading in Place as Prescribed: No Pain Present Now: No Electronic Signature(s) Signed: 06/22/2023 5:22:58 PM By: Betha Loa Entered By: Betha Loa on 06/22/2023 11:00:50 -------------------------------------------------------------------------------- Clinic Level of Care Assessment Details Patient Name: Date of Service: [name] J. 06/22/2023 1:45 PM Medical Record Number: [id number] Patient Account Number: [id number] Date of Birth/Sex: Treating RN: [birth year] (77 y.o. [name] Primary  Care Calaya Gildner: [name] Other Clinician: Betha Loa Referring Tifanie Gardiner: Treating Reshaun Briseno/Extender: [name] in Treatment: 52 Clinic Level of Care Assessment Items TOOL 1 Quantity Score []  - 0 Use when EandM and Procedure is performed on INITIAL visit ASSESSMENTS - Nursing Assessment / Reassessment []  - 0 General Physical Exam (combine w/ comprehensive assessment (listed just below) when performed on new pt. [address][name], [name] ([id number]) [id number] Page 2 of 9 []  - 0 Comprehensive Assessment (HX, ROS, Risk Assessments, Wounds Hx, etc.) ASSESSMENTS - Wound and Skin Assessment / Reassessment []  - 0 Dermatologic / Skin Assessment (not related to wound area) ASSESSMENTS - Ostomy and/or Continence Assessment and Care []  - 0 Incontinence Assessment and Management []  - 0 Ostomy Care Assessment and Management (repouching, etc.) PROCESS - Coordination of Care []  - 0 Simple Patient / Family Education for ongoing care []  - 0 Complex (extensive) Patient / Family Education for ongoing care []  - 0 Staff obtains Chiropractor, Records, T Results / Process Orders est []  - 0 Staff telephones HHA, Nursing Homes / Clarify orders / etc []  - 0 Routine Transfer to another Facility (non-emergent condition) []  - 0 Routine Hospital Admission (non-emergent condition) []  - 0 New Admissions / Manufacturing engineer / Ordering NPWT Apligraf, etc. , []  - 0 Emergency Hospital Admission (emergent condition) PROCESS - Special Needs []  - 0 Pediatric / Minor Patient Management []  - 0 Isolation Patient Management []  - 0 Hearing / Language / Visual special needs []  - 0 Assessment of Community assistance (transportation, D/C planning, etc.) []  - 0 Additional assistance / Altered mentation []  - 0 Support Surface(s) Assessment (bed, cushion, seat, etc.) INTERVENTIONS - Miscellaneous []  - 0 External ear exam []  - 0 Patient Transfer  (multiple staff / Nurse, adult / Similar devices) []  - 0 Simple Staple / Suture removal (25 or less) []  - 0 Complex Staple / Suture removal (26 or more) []  - 0 Hypo/Hyperglycemic  Management (do not check if billed separately) []  - 0 Ankle / Brachial Index (ABI) - do not check if billed separately Has the patient been seen at the hospital within the last three years: Yes Total Score: 0 Level Of Care: ____ Electronic Signature(s) Signed: 06/22/2023 5:22:58 PM By: Betha Loa Entered By: Betha Loa on 06/22/2023 11:39:04 -------------------------------------------------------------------------------- Compression Therapy Details Patient Name: Date of Service: [name], [name] 06/22/2023 1:45 PM Medical Record Number: [id number] Patient Account Number: [id number] Date of Birth/Sex: Treating RN: [birth year] (77 y.o. Christianjames, Minser, [place] ([id number]) [id number] Page 3 of 9 Primary Care Mahi Zabriskie: [name] Other Clinician: Betha Loa Referring Eustacio Ellen: Treating Lorelei Heikkila/Extender: [name] in Treatment: 52 Compression Therapy Performed for Wound Assessment: Wound #10 Left Calcaneus Performed By: Farrel Gordon, Angie, Compression Type: Double Layer Pre Treatment ABI: 1 Post Procedure Diagnosis Same as Pre-procedure Electronic Signature(s) Signed: 06/22/2023 5:22:58 PM By: Betha Loa Entered By: Betha Loa on 06/22/2023 11:35:03 -------------------------------------------------------------------------------- Encounter Discharge Information Details Patient Name: Date of Service: [name], [name] 06/22/2023 1:45 PM Medical Record Number: [id number] Patient Account Number: [id number] Date of Birth/Sex: Treating RN: 1946-06-11 (77 y.o. [name] Primary Care Henok Heacock: [name] Other Clinician: Betha Loa Referring Vinnie Bobst: Treating Pluma Diniz/Extender: [name] in Treatment: 71 Encounter Discharge Information Items Post Procedure Vitals Discharge Condition: Stable Temperature (F): 99.2 Ambulatory Status: Walker Pulse (bpm): 72 Discharge Destination: Home Respiratory Rate (breaths/min): 18 Transportation: Private Auto Blood Pressure (mmHg): 111/62 Accompanied By: self Schedule Follow-up Appointment: Yes Clinical Summary of Care: Electronic Signature(s) Signed: 06/22/2023 5:22:58 PM By: Betha Loa Entered By: Betha Loa on 06/22/2023 14:18:09 -------------------------------------------------------------------------------- Lower Extremity Assessment Details Patient Name: Date of Service: [name], [name] 06/22/2023 1:45 PM Medical Record Number: [id number] Patient Account Number: [id number] Date of Birth/Sex: Treating RN: 10/02/1946 (77 y.o. [name] Primary Care Arma Reining: [name] Other Clinician: Betha Loa Referring Yulanda Diggs: Treating Zakyria Metzinger/Extender: [name] in Treatment: [address] [place] ([id number]) [id number] Page 4 of 9 Edema Assessment Assessed: [Left: Yes] [Right: No] Edema: [Left: Ye] [Right: s] Calf Left: Right: Point of Measurement: 33 cm From Medial Instep 38.5 cm Ankle Left: Right: Point of Measurement: 10 cm From Medial Instep 21.5 cm Vascular Assessment Pulses: Dorsalis Pedis Palpable: [Left:Yes] Toe Nail Assessment Left: Right: Thick: Yes Discolored: Yes Deformed: No Improper Length and Hygiene: No Electronic Signature(s) Signed: 06/22/2023 5:22:58 PM By: Betha Loa Signed: 06/22/2023 5:30:17 PM By: Midge Aver MSN RN CNS WTA Entered By: Betha Loa on 06/22/2023 11:19:37 -------------------------------------------------------------------------------- Multi Wound Chart Details Patient Name: Date of Service: [name], [name] 06/22/2023 1:45 PM Medical Record Number: [id number] Patient Account Number:  [id number] Date of Birth/Sex: Treating RN: [birth year] (77 y.o. [name] Primary Care Niklas Chretien: [name] Other Clinician: Betha Loa Referring Jayda White: Treating Cyla Haluska/Extender: Hermine Messick Weeks in Treatment: 59 Vital Signs Height(in): 70 Pulse(bpm): 72 Weight(lbs): 265 Blood Pressure(mmHg): 111/62 Body Mass Index(BMI): 38 Temperature(F): 99.2 Respiratory Rate(breaths/min): 18 [10:Photos:] [N/A:N/A] COLON, DELAOSSA ([id number]) [10:Left Calcaneus Wound Location: Gradually Appeared Wounding Event: Diabetic Wound/Ulcer of the Lower Primary Etiology: Extremity Pressure Ulcer Secondary Etiology: Cataracts, Arrhythmia, Coronary Comorbid History: Artery Disease, Hypertension, Type II  Diabetes, Osteoarthritis, Neuropathy 06/05/2022 Date Acquired: 32 Weeks of Treatment: Open Wound Status: No Wound Recurrence: 0.4x0.6x0.2 Measurements L x W x D (cm) 0.188 A (cm) : rea 0.038 Volume (cm) : 86.70% % Reduction in A rea: 86.60% %  Reduction  in Volume: Grade 1 Classification: Medium Exudate A mount: Serosanguineous Exudate Type: red, brown Exudate Color: Flat and Intact Wound Margin: Small (1-33%) Granulation A mount: Pink Granulation Quality: Small (1-33%) Necrotic A mount: Fat Layer  (Subcutaneous Tissue): Yes N/A Exposed Structures: Fascia: No Tendon: No Muscle: No Joint: No Bone: No Medium (34-66%) Epithelialization:] [N/A:N/A N/A N/A N/A N/A N/A N/A N/A N/A N/A N/A N/A N/A N/A N/A N/A N/A N/A N/A N/A N/A N/A N/A] Treatment Notes Electronic Signature(s) Signed: 06/22/2023 5:22:58 PM By: Betha Loa Entered By: Betha Loa on 06/22/2023 11:20:25 -------------------------------------------------------------------------------- Multi-Disciplinary Care Plan Details Patient Name: Date of Service: [name], [name] 06/22/2023 1:45 PM Medical Record Number: [id number] Patient Account Number: [id number] Date of Birth/Sex: Treating RN: 12-06-1945 (77 y.o. [name] Primary Care Pratham Cassatt: [name] Other Clinician: Betha Loa Referring Caeli Linehan: Treating Johnette Teigen/Extender: Hermine Messick Weeks in Treatment: 79 Active Inactive Wound/Skin Impairment Nursing Diagnoses: Impaired tissue integrity Goals: Patient/caregiver will verbalize understanding of skin care regimen Date Initiated: 06/19/2022 Target Resolution Date: 07/19/2022 Goal Status: Active Ulcer/skin breakdown will have a volume reduction of 30% by week 4 Date Initiated: 06/19/2022 Date Inactivated: 07/28/2022 Target Resolution Date: 07/17/2022 Goal Status: Unmet Unmet Reason: infection BERNHARD, SPEAK ([id number]) [id number] Page 6 of 9 Ulcer/skin breakdown will have a volume reduction of 50% by week 8 Date Initiated: 07/28/2022 Date Inactivated: 05/18/2023 Target Resolution Date: 03/20/2023 Goal Status: Unmet Unmet Reason: comorbidities Interventions: Assess patient/caregiver ability to obtain necessary supplies Assess patient/caregiver ability to perform ulcer/skin care regimen upon admission and as needed Assess ulceration(s) every visit Provide education on ulcer and skin care Treatment Activities: Skin care regimen initiated : 06/19/2022 Topical wound management initiated : 06/19/2022 Notes: Electronic Signature(s) Signed: 06/22/2023 5:22:58 PM By: Betha Loa Signed: 06/22/2023 5:30:17 PM By: Midge Aver MSN RN CNS WTA Entered By: Betha Loa on 06/22/2023 11:54:04 -------------------------------------------------------------------------------- Pain Assessment Details Patient Name: Date of Service: [name], [name] 06/22/2023 1:45 PM Medical Record Number: [id number] Patient Account Number: [id number] Date of Birth/Sex: Treating RN: [birth year] (77 y.o. [name] Primary Care Masiya Claassen: [name] Other Clinician: Betha Loa Referring Jenniferann Stuckert: Treating Elaysia Devargas/Extender: Hermine Messick Weeks in Treatment: 52 Active Problems Location of Pain Severity and Description of Pain Patient Has Paino No Site Locations Pain Management and Medication Current Pain Management: Electronic Signature(s) Signed: 06/22/2023 5:22:58 PM By: Betha Loa Signed: 06/22/2023 5:30:17 PM By: Midge Aver MSN RN CNS [address], [name] Lunger ([id number]) [id number] Page 7 of 9 Entered By: Betha Loa on 06/22/2023 11:07:14 -------------------------------------------------------------------------------- Patient/Caregiver Education Details Patient Name: Date of Service: [name] 9/3/2024andnbsp1:45 PM Medical Record Number: [id number] Patient Account Number: [id number] Date of Birth/Gender: Treating RN: 1946-10-02 (77 y.o. [name] Primary Care Physician: [name] Other Clinician: Betha Loa Referring Physician: Treating Physician/Extender: [name] in Treatment: 29 Education Assessment Education Provided To: Patient Education Topics Provided Wound/Skin Impairment: Handouts: Other: continue wound care as directed Methods: Explain/Verbal Responses: State content correctly Electronic Signature(s) Signed: 06/22/2023 5:22:58 PM By: Betha Loa Entered By: Betha Loa on 06/22/2023 14:14:00 -------------------------------------------------------------------------------- Wound Assessment Details Patient Name: Date of Service: [name], [name] 06/22/2023 1:45 PM Medical Record Number: [id number] Patient Account Number: [id number] Date of Birth/Sex: Treating RN: [birth year] (77 y.o. [name] Primary Care Miki Labuda: [name] Other Clinician: Betha Loa Referring Diem Pagnotta: Treating Agape Hardiman/Extender: Hermine Messick Weeks in Treatment: 52 Wound Status Wound Number: 10  Primary Diabetic Wound/Ulcer of the Lower Extremity Etiology: Wound Location: Left  Calcaneus Secondary Pressure Ulcer Wounding Event: Gradually Appeared Etiology: Date Acquired: 06/05/2022 Wound Open Weeks Of Treatment: 52 Status: Clustered Wound: No Comorbid Cataracts, Arrhythmia, Coronary Artery Disease, Hypertension, History: Type II Diabetes, Osteoarthritis, Neuropathy Photos MASAICHI, VANACKER ([id number]) [phone].pdf Page 8 of 9 Wound Measurements Length: (cm) 0.4 Width: (cm) 0.6 Depth: (cm) 0.2 Area: (cm) 0.188 Volume: (cm) 0.038 % Reduction in Area: 86.7% % Reduction in Volume: 86.6% Epithelialization: Medium (34-66%) Wound Description Classification: Grade 1 Wound Margin: Flat and Intact Exudate Amount: Medium Exudate Type: Serosanguineous Exudate Color: red, brown Foul Odor After Cleansing: No Slough/Fibrino Yes Wound Bed Granulation Amount: Small (1-33%) Exposed Structure Granulation Quality: Pink Fascia Exposed: No Necrotic Amount: Small (1-33%) Fat Layer (Subcutaneous Tissue) Exposed: Yes Necrotic Quality: Adherent Slough Tendon Exposed: No Muscle Exposed: No Joint Exposed: No Bone Exposed: No Treatment Notes Wound #10 (Calcaneus) Wound Laterality: Left Cleanser Peri-Wound Care Topical Mupirocin Ointment Discharge Instruction: Apply as directed by Lavonne Cass. Primary Dressing Hydrofera Blue Ready Transfer Foam, 2.5x2.5 (in/in) Discharge Instruction: Apply Hydrofera Blue Ready to wound bed as directed Secondary Dressing ABD Pad 5x9 (in/in) Discharge Instruction: Cover with ABD pad Secured With Compression Wrap Urgo K2 Lite, two layer compression system, regular Compression Stockings Add-Ons Electronic Signature(s) Signed: 06/22/2023 5:22:58 PM By: Betha Loa Signed: 06/22/2023 5:30:17 PM By: Midge Aver MSN RN CNS WTA Entered By: Betha Loa on 06/22/2023 11:17:18 Barrie Folk ([id number]) [id number] Page 9 of  9 -------------------------------------------------------------------------------- Vitals Details Patient Name: Date of Service: [name], [name] 06/22/2023 1:45 PM Medical Record Number: [id number] Patient Account Number: [id number] Date of Birth/Sex: Treating RN: [birth year] (77 y.o. [name] Primary Care Zaven Klemens: [name] Other Clinician: Betha Loa Referring Jaila Schellhorn: Treating Chevelle Durr/Extender: Hermine Messick Weeks in Treatment: 52 Vital Signs Time Taken: 14:06 Temperature (F): 99.2 Height (in): 70 Pulse (bpm): 72 Weight (lbs): 265 Respiratory Rate (breaths/min): 18 Body Mass Index (BMI): 38 Blood Pressure (mmHg): 111/62 Reference Range: 80 - 120 mg / dl Electronic Signature(s) Signed: 06/22/2023 5:22:58 PM By: Betha Loa Entered By: Betha Loa on 06/22/2023 11:07:03

## 2023-06-23 NOTE — Progress Notes (Signed)
QUILL, RAJARAM ([id number]) [phone] [id number].pdf Page 1 of 1 Visit Report for 06/22/2023 Fall Risk Assessment Details Patient Name: Date of Service: [name], GEO [facility] J. 06/22/2023 1:45 PM Medical Record Number: [id number] Patient Account Number: [id number] Date of Birth/Sex: Treating RN: [birth year] (76 y.o. [name] Primary Care Sopheap Basic: [name] Other Clinician: Betha Loa Referring Aideliz Garmany: Treating Markevius Trombetta/Extender: [name] in Treatment: 91 Fall Risk Assessment Items Have you had 2 or more falls in the last 12 monthso 0 Yes Have you had any fall that resulted in injury in the last 12 monthso 0 Yes FALLS RISK SCREEN History of falling - immediate or within 3 months 25 Yes Secondary diagnosis (Do you have 2 or more medical diagnoseso) 15 Yes Ambulatory aid None/bed rest/wheelchair/nurse 0 No Crutches/cane/walker 15 Yes Furniture 0 No Intravenous therapy Access/Saline/Heparin Lock 0 No Gait/Transferring Normal/ bed rest/ wheelchair 0 Yes Weak (short steps with or without shuffle, stooped but able to lift head while walking, may seek 0 No support from furniture) Impaired (short steps with shuffle, may have difficulty arising from chair, head down, impaired 20 Yes balance) Mental Status Oriented to own ability 0 Yes Electronic Signature(s) Signed: 06/22/2023 5:22:58 PM By: Betha Loa Signed: 06/22/2023 5:30:17 PM By: Midge Aver MSN RN CNS WTA Entered By: Betha Loa on 06/22/2023 11:02:49

## 2023-06-24 DIAGNOSIS — R791 Abnormal coagulation profile: Secondary | ICD-10-CM | POA: Diagnosis not present

## 2023-06-28 ENCOUNTER — Encounter: Payer: Medicare HMO | Admitting: Physician Assistant

## 2023-06-28 DIAGNOSIS — Z7901 Long term (current) use of anticoagulants: Secondary | ICD-10-CM | POA: Diagnosis not present

## 2023-06-28 DIAGNOSIS — E1151 Type 2 diabetes mellitus with diabetic peripheral angiopathy without gangrene: Secondary | ICD-10-CM | POA: Diagnosis not present

## 2023-06-28 DIAGNOSIS — I10 Essential (primary) hypertension: Secondary | ICD-10-CM | POA: Diagnosis not present

## 2023-06-28 DIAGNOSIS — L97422 Non-pressure chronic ulcer of left heel and midfoot with fat layer exposed: Secondary | ICD-10-CM | POA: Diagnosis not present

## 2023-06-28 DIAGNOSIS — Z952 Presence of prosthetic heart valve: Secondary | ICD-10-CM | POA: Diagnosis not present

## 2023-06-28 DIAGNOSIS — E1142 Type 2 diabetes mellitus with diabetic polyneuropathy: Secondary | ICD-10-CM | POA: Diagnosis not present

## 2023-06-28 DIAGNOSIS — E11621 Type 2 diabetes mellitus with foot ulcer: Secondary | ICD-10-CM | POA: Diagnosis not present

## 2023-06-28 DIAGNOSIS — L97522 Non-pressure chronic ulcer of other part of left foot with fat layer exposed: Secondary | ICD-10-CM | POA: Diagnosis not present

## 2023-06-28 DIAGNOSIS — G8929 Other chronic pain: Secondary | ICD-10-CM | POA: Diagnosis not present

## 2023-06-28 NOTE — Progress Notes (Addendum)
rea: 96.80% N/A N/A % Reduction in Volume: Grade 1 N/A N/A Classification: Medium N/A N/A Exudate A mount: Serosanguineous N/A N/A Exudate Type: red, brown N/A N/A Exudate Color: Flat and Intact N/A N/A Wound Margin: Small (1-33%) N/A N/A Granulation A mount: Pink N/A N/A Granulation Quality: Small (1-33%) N/A N/A Necrotic A mount: Fat Layer (Subcutaneous Tissue): Yes N/A N/A Exposed Structures: Fascia: No Tendon: No Muscle: No Joint: No Bone: No Medium (34-66%) N/A N/A Epithelialization: Treatment Notes Electronic Signature(s) Signed: 06/29/2023 4:31:19 PM By: Betha Loa Entered By: Betha Loa on 06/28/2023 14:42:24 -------------------------------------------------------------------------------- Multi-Disciplinary Care Plan Details Patient Name: Date of Service: [name], [name] 06/28/2023 1:45 PM Medical Record Number: [id number] Patient Account Number: [id number] Date of Birth/Sex: Treating  RN: 02-02-1946 (76 y.o. [name]) [name] Primary Care Finnleigh Marchetti: [name] Other Clinician: Betha Loa Referring Mikalia Fessel: Treating Amylah Will/Extender: [name] in Treatment: 53 Active Inactive Wound/Skin Impairment Nursing Diagnoses: Impaired tissue integrity Goals: Patient/caregiver will verbalize understanding of skin care regimen Date Initiated: 06/19/2022 Target Resolution Date: 07/19/2022 Goal Status: Active Ulcer/skin breakdown will have a volume reduction of 30% by week 4 Date Initiated: 06/19/2022 Date Inactivated: 07/28/2022 Target Resolution Date: 07/17/2022 Goal Status: Unmet Unmet Reason: infection Ulcer/skin breakdown will have a volume reduction of 50% by week 8 Date Initiated: 07/28/2022 Date Inactivated: 05/18/2023 Target Resolution Date: 03/20/2023 Goal Status: Unmet Unmet Reason: comorbidities Interventions: Assess patient/caregiver ability to obtain necessary supplies Assess patient/caregiver ability to perform ulcer/skin care regimen upon admission and as needed ABBOTT, MICHAELIS ([id number]) [id number] Page 6 of 9 Assess ulceration(s) every visit Provide education on ulcer and skin care Treatment Activities: Skin care regimen initiated : 06/19/2022 Topical wound management initiated : 06/19/2022 Notes: Electronic Signature(s) Signed: 06/29/2023 4:31:19 PM By: Betha Loa Signed: 07/02/2023 1:15:19 PM By: [name] RN Entered By: Betha Loa on 06/28/2023 14:59:18 -------------------------------------------------------------------------------- Pain Assessment Details Patient Name: Date of Service: [name], [name] 06/28/2023 1:45 PM Medical Record Number: [id number] Patient Account Number: [id number] Date of Birth/Sex: Treating RN: 01/10/1946 (76 y.o. [name] Primary Care Aprill Banko: [name] Other Clinician: Betha Loa Referring Kyann Heydt: Treating Mckinzey Entwistle/Extender: [name] in Treatment: 85 Active Problems Location of Pain Severity and Description of Pain Patient Has Paino No Site Locations Pain Management and Medication Current Pain Management: Electronic Signature(s) Signed: 06/29/2023 4:31:19 PM By: Betha Loa Signed: 07/02/2023 1:15:19 PM By: [name] RN Entered By: Betha Loa on 06/28/2023 14:31:41 Barrie Folk ([id number]) [id number] Page 7 of 9 -------------------------------------------------------------------------------- Patient/Caregiver Education Details Patient Name: Date of Service: [name] [facility] 9/9/2024andnbsp1:45 PM Medical Record Number: [id number] Patient Account Number: [id number] Date of Birth/Gender: Treating RN: 12/09/1945 (76 y.o. [name]) [name] Primary Care Physician: [name] Other Clinician: Betha Loa Referring Physician: Treating Physician/Extender: [name] in Treatment: 106 Education Assessment Education Provided To: Patient Education Topics Provided Wound/Skin Impairment: Handouts: Other: continue wound care as directed Methods: Explain/Verbal Responses: State content correctly Electronic Signature(s) Signed: 06/29/2023 4:31:19 PM By: Betha Loa Entered By: Betha Loa on 06/28/2023 14:59:41 -------------------------------------------------------------------------------- Wound Assessment Details Patient Name: Date of Service: [name], [name] 06/28/2023 1:45 PM Medical Record Number: [id number] Patient Account Number: [id number] Date of Birth/Sex: Treating RN: [birth year] (76 y.o. [name] Primary Care Tallan Sandoz: [name] Other Clinician: Betha Loa Referring Veva Grimley: Treating Savien Mamula/Extender: Hermine Messick Weeks in Treatment: 53 Wound Status Wound Number: 10 Primary Diabetic Wound/Ulcer of the Lower Extremity  0 Ankle / Brachial Index (ABI) - do not check if billed separately Has the patient been seen at the hospital within the last three years: Yes Total Score: 0 Level Of Care: ____ Electronic Signature(s) Signed: 06/29/2023 4:31:19 PM By: Betha Loa Entered By: Betha Loa on 06/28/2023 14:59:04 -------------------------------------------------------------------------------- Compression Therapy Details Patient Name: Date of Service: [name], [name] 06/28/2023 1:45 PM Medical Record Number: [id number] Patient Account Number: [id number] Date of Birth/Sex: Treating RN: Feb 20, 1946 (76 y.o. [name] Primary Care Adin Lariccia: [name] Other Clinician: Dominiq, Nealy ([id number]) [id number] Page 3 of 9 Referring Makaelyn Aponte: Treating Burr Soffer/Extender: [name] in Treatment: 53 Compression Therapy Performed for Wound Assessment: Wound #10 Left Calcaneus Performed By: Farrel Gordon, Angie, Compression Type: Double Layer Pre Treatment ABI: 1 Post Procedure Diagnosis Same as Pre-procedure Electronic Signature(s) Signed: 06/29/2023 4:31:19 PM By: Betha Loa Entered By: Betha Loa on 06/28/2023 14:58:38 -------------------------------------------------------------------------------- Encounter Discharge Information Details Patient Name: Date of Service: [name], [name] 06/28/2023 1:45 PM Medical Record Number: [id number] Patient Account Number: [id number] Date of Birth/Sex: Treating RN: 10/13/1946 (76 y.o. [name] Primary Care Forever Arechiga: [name] Other Clinician: Betha Loa Referring Merla Sawka: Treating Analilia Geddis/Extender: [name] in Treatment: 71 Encounter  Discharge Information Items Post Procedure Vitals Discharge Condition: Stable Temperature (F): 98.4 Ambulatory Status: Walker Pulse (bpm): 74 Discharge Destination: Home Respiratory Rate (breaths/min): 18 Transportation: Private Auto Blood Pressure (mmHg): 116/75 Accompanied By: self Schedule Follow-up Appointment: Yes Clinical Summary of Care: Electronic Signature(s) Signed: 06/29/2023 4:31:19 PM By: Betha Loa Entered By: Betha Loa on 06/28/2023 15:46:41 -------------------------------------------------------------------------------- Lower Extremity Assessment Details Patient Name: Date of Service: [name] [facility] J. 06/28/2023 1:45 PM Medical Record Number: [id number] Patient Account Number: [id number] Date of Birth/Sex: Treating RN: 1946/04/05 (76 y.o. [name] Primary Care Lawson Mahone: [name] Other Clinician: Betha Loa Referring Arlie Riker: Treating Lyvonne Cassell/Extender: [name] in Treatment: [address] [place] J ([id number]) [id number] Page 4 of 9 Edema Assessment Assessed: [Left: Yes] [Right: No] Edema: [Left: Ye] [Right: s] Calf Left: Right: Point of Measurement: 33 cm From Medial Instep 37.5 cm Ankle Left: Right: Point of Measurement: 10 cm From Medial Instep 21.5 cm Vascular Assessment Pulses: Dorsalis Pedis Palpable: [Left:Yes] Electronic Signature(s) Signed: 06/29/2023 4:31:19 PM By: Betha Loa Signed: 07/02/2023 1:15:19 PM By: [name] RN Entered By: Betha Loa on 06/28/2023 14:42:16 -------------------------------------------------------------------------------- Multi Wound Chart Details Patient Name: Date of Service: [name], [name] 06/28/2023 1:45 PM Medical Record Number: [id number] Patient Account Number: [id number] Date of Birth/Sex: Treating RN: 1945/12/07 (76 y.o. [name] Primary Care [place] Hanlon: [name] Other Clinician: Betha Loa Referring  Lundyn Coste: Treating Minh Roanhorse/Extender: [name] in Treatment: 53 Vital Signs Height(in): 70 Pulse(bpm): 74 Weight(lbs): 265 Blood Pressure(mmHg): 116/75 Body Mass Index(BMI): 38 Temperature(F): 98.4 Respiratory Rate(breaths/min): 18 [10:Photos:] [N/A:N/A] Left Calcaneus N/A N/A Wound Location: Gradually Appeared N/A N/A Wounding 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 N/A N/A Secondary Etiology: Cataracts, Arrhythmia, Coronary N/A N/A Comorbid History: Artery Disease, Hypertension, Type II Diabetes, Osteoarthritis, Neuropathy RUFINO, TACK ([id number]) [id number] Page 5 of 9 06/05/2022 N/A N/A Date Acquired: 86 N/A N/A Weeks of Treatment: Open N/A N/A Wound Status: No N/A N/A Wound Recurrence: 0.3x0.4x0.1 N/A N/A Measurements L x W x D (cm) 0.094 N/A N/A A (cm) : rea 0.009 N/A N/A Volume (cm) : 93.40% N/A N/A % Reduction in A  Etiology: Wound Location: Left  Calcaneus Secondary Pressure Ulcer Wounding Event: Gradually Appeared Etiology: Date Acquired: 06/05/2022 Wound Open Weeks Of Treatment: 53 Status: Clustered Wound: No Comorbid Cataracts, Arrhythmia, Coronary Artery Disease, Hypertension, History: Type II Diabetes, Osteoarthritis, Neuropathy Photos MADDEX, ALLTOP ([id number]) [phone].pdf Page 8 of 9 Wound Measurements Length: (cm) 0.3 Width: (cm) 0.4 Depth: (cm) 0.1 Area: (cm) 0.094 Volume: (cm) 0.009 % Reduction in Area: 93.4% % Reduction in Volume: 96.8% Epithelialization: Medium (34-66%) Wound Description Classification: Grade 1 Wound Margin: Flat and Intact Exudate Amount: Medium Exudate Type: Serosanguineous Exudate Color: red, brown Foul Odor After Cleansing: No Slough/Fibrino Yes Wound Bed Granulation Amount: Small (1-33%) Exposed Structure Granulation Quality: Pink Fascia Exposed: No Necrotic Amount: Small (1-33%) Fat Layer (Subcutaneous Tissue) Exposed: Yes Necrotic Quality: Adherent Slough Tendon Exposed: No Muscle Exposed: No Joint Exposed: No Bone Exposed: No Treatment Notes Wound #10 (Calcaneus) Wound Laterality: Left Cleanser Peri-Wound Care Topical Mupirocin Ointment Discharge Instruction: Apply as directed by Latrease Kunde. Primary Dressing Hydrofera Blue Ready Transfer Foam, 2.5x2.5 (in/in) Discharge Instruction: Apply Hydrofera Blue Ready to wound bed as directed Secondary Dressing ABD Pad 5x9 (in/in) Discharge Instruction: Cover with ABD pad Secured With Compression Wrap Urgo K2 Lite, two layer compression system, regular Compression Stockings Add-Ons Electronic Signature(s) Signed: 06/29/2023 4:31:19 PM By: Betha Loa Signed: 07/02/2023 1:15:19 PM By: [name] RN Entered By: Betha Loa on 06/28/2023 14:41:25 Barrie Folk ([id number]) [id number] Page 9 of  9 -------------------------------------------------------------------------------- Vitals Details Patient Name: Date of Service: [name], [name] 06/28/2023 1:45 PM Medical Record Number: [id number] Patient Account Number: [id number] Date of Birth/Sex: Treating RN: 10-23-1945 (76 y.o. [name]) [name] Primary Care Mosi Hannold: [name] Other Clinician: Betha Loa Referring Lamondre Wesche: Treating Amisha Pospisil/Extender: [name] in Treatment: 34 Vital Signs Time Taken: 14:29 Temperature (F): 98.4 Height (in): 70 Pulse (bpm): 74 Weight (lbs): 265 Respiratory Rate (breaths/min): 18 Body Mass Index (BMI): 38 Blood Pressure (mmHg): 116/75 Reference Range: 80 - 120 mg / dl Electronic Signature(s) Signed: 06/29/2023 4:31:19 PM By: Betha Loa Entered By: Betha Loa on 06/28/2023 14:31:34  rea: 96.80% N/A N/A % Reduction in Volume: Grade 1 N/A N/A Classification: Medium N/A N/A Exudate A mount: Serosanguineous N/A N/A Exudate Type: red, brown N/A N/A Exudate Color: Flat and Intact N/A N/A Wound Margin: Small (1-33%) N/A N/A Granulation A mount: Pink N/A N/A Granulation Quality: Small (1-33%) N/A N/A Necrotic A mount: Fat Layer (Subcutaneous Tissue): Yes N/A N/A Exposed Structures: Fascia: No Tendon: No Muscle: No Joint: No Bone: No Medium (34-66%) N/A N/A Epithelialization: Treatment Notes Electronic Signature(s) Signed: 06/29/2023 4:31:19 PM By: Betha Loa Entered By: Betha Loa on 06/28/2023 14:42:24 -------------------------------------------------------------------------------- Multi-Disciplinary Care Plan Details Patient Name: Date of Service: [name], [name] 06/28/2023 1:45 PM Medical Record Number: [id number] Patient Account Number: [id number] Date of Birth/Sex: Treating  RN: 02-02-1946 (76 y.o. [name]) [name] Primary Care Finnleigh Marchetti: [name] Other Clinician: Betha Loa Referring Mikalia Fessel: Treating Amylah Will/Extender: [name] in Treatment: 53 Active Inactive Wound/Skin Impairment Nursing Diagnoses: Impaired tissue integrity Goals: Patient/caregiver will verbalize understanding of skin care regimen Date Initiated: 06/19/2022 Target Resolution Date: 07/19/2022 Goal Status: Active Ulcer/skin breakdown will have a volume reduction of 30% by week 4 Date Initiated: 06/19/2022 Date Inactivated: 07/28/2022 Target Resolution Date: 07/17/2022 Goal Status: Unmet Unmet Reason: infection Ulcer/skin breakdown will have a volume reduction of 50% by week 8 Date Initiated: 07/28/2022 Date Inactivated: 05/18/2023 Target Resolution Date: 03/20/2023 Goal Status: Unmet Unmet Reason: comorbidities Interventions: Assess patient/caregiver ability to obtain necessary supplies Assess patient/caregiver ability to perform ulcer/skin care regimen upon admission and as needed ABBOTT, MICHAELIS ([id number]) [id number] Page 6 of 9 Assess ulceration(s) every visit Provide education on ulcer and skin care Treatment Activities: Skin care regimen initiated : 06/19/2022 Topical wound management initiated : 06/19/2022 Notes: Electronic Signature(s) Signed: 06/29/2023 4:31:19 PM By: Betha Loa Signed: 07/02/2023 1:15:19 PM By: [name] RN Entered By: Betha Loa on 06/28/2023 14:59:18 -------------------------------------------------------------------------------- Pain Assessment Details Patient Name: Date of Service: [name], [name] 06/28/2023 1:45 PM Medical Record Number: [id number] Patient Account Number: [id number] Date of Birth/Sex: Treating RN: 01/10/1946 (76 y.o. [name] Primary Care Aprill Banko: [name] Other Clinician: Betha Loa Referring Kyann Heydt: Treating Mckinzey Entwistle/Extender: [name] in Treatment: 85 Active Problems Location of Pain Severity and Description of Pain Patient Has Paino No Site Locations Pain Management and Medication Current Pain Management: Electronic Signature(s) Signed: 06/29/2023 4:31:19 PM By: Betha Loa Signed: 07/02/2023 1:15:19 PM By: [name] RN Entered By: Betha Loa on 06/28/2023 14:31:41 Barrie Folk ([id number]) [id number] Page 7 of 9 -------------------------------------------------------------------------------- Patient/Caregiver Education Details Patient Name: Date of Service: [name] [facility] 9/9/2024andnbsp1:45 PM Medical Record Number: [id number] Patient Account Number: [id number] Date of Birth/Gender: Treating RN: 12/09/1945 (76 y.o. [name]) [name] Primary Care Physician: [name] Other Clinician: Betha Loa Referring Physician: Treating Physician/Extender: [name] in Treatment: 106 Education Assessment Education Provided To: Patient Education Topics Provided Wound/Skin Impairment: Handouts: Other: continue wound care as directed Methods: Explain/Verbal Responses: State content correctly Electronic Signature(s) Signed: 06/29/2023 4:31:19 PM By: Betha Loa Entered By: Betha Loa on 06/28/2023 14:59:41 -------------------------------------------------------------------------------- Wound Assessment Details Patient Name: Date of Service: [name], [name] 06/28/2023 1:45 PM Medical Record Number: [id number] Patient Account Number: [id number] Date of Birth/Sex: Treating RN: [birth year] (76 y.o. [name] Primary Care Tallan Sandoz: [name] Other Clinician: Betha Loa Referring Veva Grimley: Treating Savien Mamula/Extender: Hermine Messick Weeks in Treatment: 53 Wound Status Wound Number: 10 Primary Diabetic Wound/Ulcer of the Lower Extremity

## 2023-06-28 NOTE — Progress Notes (Addendum)
SABIEN, MAKAREWICZ ([id number]) [id number] Page 1 of 14 Visit Report for 06/28/2023 Chief Complaint Document Details Patient Name: Date of Service: [name], [name] [facility] J. 06/28/2023 1:45 PM Medical Record Number: [id number] Patient Account Number: [id number] Date of Birth/Sex: Treating RN: 1946/09/04 (77 y.o. [name]) [name] Primary Care Provider: [name] Other Clinician: Betha Loa Referring Provider: Treating Provider/Extender: [name] in Treatment: 35 Information Obtained from: Patient Chief Complaint Left heel ulcer Electronic Signature(s) Signed: 06/28/2023 1:45:26 PM By: [name] Derry PA-C Entered By: [name] Derry on 06/28/2023 13:45:26 -------------------------------------------------------------------------------- Debridement Details Patient Name: Date of Service: [name], [name] [name] 06/28/2023 1:45 PM Medical Record Number: [id number] Patient Account Number: [id number] Date of Birth/Sex: Treating RN: 03/19/1946 (77 y.o. [name]) [name] Primary Care Provider: [name] Other Clinician: Betha Loa Referring Provider: Treating Provider/Extender: [name] in Treatment: 53 Debridement Performed for Assessment: Wound #10 Left Calcaneus Performed By: Physician [name] Derry, PA-C Debridement Type: Debridement Severity of Tissue Pre Debridement: Fat layer exposed Level of Consciousness (Pre-procedure): Awake and Alert Pre-procedure Verification/Time Out Yes - 14:55 Taken: Start Time: 14:55 Percent of Wound Bed Debrided: 100% T Area Debrided (cm): otal 0.09 Tissue and other material debrided: Viable, Non-Viable, Callus, Slough, Slough Level: Non-Viable Tissue Debridement Description: Selective/Open Wound Instrument: Curette Bleeding: Moderate Hemostasis Achieved: Silver Nitrate Response to Treatment: Procedure was tolerated well Level of Consciousness (Post- Awake and  Alert procedure): ALISA, APPLEYARD ([id number]) [id number].pdf Page 2 of 14 Post Debridement Measurements of Total Wound Length: (cm) 0.3 Width: (cm) 0.4 Depth: (cm) 0.1 Volume: (cm) 0.009 Character of Wound/Ulcer Post Debridement: Stable Severity of Tissue Post Debridement: Fat layer exposed Post Procedure Diagnosis Same as Pre-procedure Notes one silver nitrate stick used to stop bleeding Electronic Signature(s) Signed: 06/29/2023 4:31:19 PM By: Betha Loa Signed: 07/01/2023 4:31:22 PM By: [name] Derry PA-C Signed: 07/02/2023 1:15:19 PM By: [name] RN Entered By: Betha Loa on 06/28/2023 14:57:15 -------------------------------------------------------------------------------- HPI Details Patient Name: Date of Service: [name], [name] [name] 06/28/2023 1:45 PM Medical Record Number: [id number] Patient Account Number: [id number] Date of Birth/Sex: Treating RN: 1946-09-06 (77 y.o. 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 He has been seeing podiatry and they have been attempting to manage this including what sounds to be a total contact cast, Unna boot, and just standard dressings otherwise as well. Most recently has been using triple antibiotic ointment. With that being said unfortunately despite everything he really has not had any significant improvement. He tells me that he cannot even really remember exactly how this began but he presumed it may have rubbed on his shoes or something of that nature. 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 He tells me that this does seem to help. Fortunately there is no signs of active infection at this time. His most recent hemoglobin A1c was 8.1 though he knows this was this year he cannot tell me the exact time. His  this this visit. 05-18-2023 upon evaluation today patient's wound actually showing signs of improvement. Fortunately I do not see any signs of active infection locally or systemically which is great news. No fevers, chills, nausea, vomiting, or diarrhea. 05-24-2023 upon evaluation today patient's wound on the heel appears to be doing quite well. Fortunately there does not appear to be any signs of active infection at this time which is great news. Fortunately I think that he is making excellent headway with the current treatment regimen. 06-01-2023 upon evaluation today patient appears to be doing excellent in regard to his wound on the heel this is actually showing signs of being smaller measuring better and looking like is doing quite well. Fortunately I do not see any signs of infection at this time. He does have a small wound on the leg which we can also need to address this looks like it was just a small blister that ruptured I think it should heal pretty quickly. 06-07-2023 upon evaluation today patient appears to be doing well currently in regard to his wound. He has been tolerating the dressing changes without complication. Fortunately there does not appear to be any signs of active infection locally or  systemically at this time. No fevers, chills, nausea, vomiting, or diarrhea. [name], [name] ([id number]) [id number] Page 6 of 14 8/26; wound near the tip of the left heel. He is a diabetic. We have been using Hydrofera Blue under Urgo K2 lite wrap he has been in his surgical shoe although he tells me it disintegrated and he came in on a regular running shoe today. 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 He is not wearing compression stocking 06-22-2023 upon evaluation today patient appears to be doing excellent in regard to his wound on the heel which is actually significantly smaller compared to last week's evaluation. Fortunately I do not see any signs of active infection locally or systemically at this time. 06-28-2023 upon evaluation today patient appears to be doing well currently in regard to his heel which is actually showing signs of improvement. Fortunately I do not see any evidence of active infection at this time which is great news and in general I do believe that we are making headway towards complete closure which is good news. No fevers, chills, nausea, vomiting, or diarrhea. Electronic Signature(s) Signed: 06/28/2023 3:02:07 PM By: [name] Derry PA-C Previous Signature: 06/28/2023 2:51:31 PM Version By: [name] Derry PA-C Entered By: [name] Derry on 06/28/2023 15:02:07 -------------------------------------------------------------------------------- Physical Exam Details Patient Name: Date of Service: [name], [name] [name] 06/28/2023 1:45 PM Medical Record Number: [id number] Patient Account Number: [id number] Date of Birth/Sex: Treating RN: 1945/11/03 (76 y.o. [name] Primary Care Provider: [name] Other Clinician: Betha Loa Referring Provider: Treating Provider/Extender: Hermine Messick Weeks in Treatment: 82 Constitutional Well-nourished and well-hydrated in no acute distress. Respiratory normal  breathing without difficulty. Psychiatric this patient is able to make decisions and demonstrates good insight into disease process. Alert and Oriented x 3. pleasant and cooperative. Notes Upon inspection patient's wound showed signs of good granulation epithelization at this point. Fortunately I do not see any signs of worsening overall and I do believe that the patient is making headway towards closure. I do think that he is going require some debridement clearway slough and biofilm down to good subcutaneous tissue also removed some of the callus around there was 1 area when removing the callus that was bleeding I used a  this this visit. 05-18-2023 upon evaluation today patient's wound actually showing signs of improvement. Fortunately I do not see any signs of active infection locally or systemically which is great news. No fevers, chills, nausea, vomiting, or diarrhea. 05-24-2023 upon evaluation today patient's wound on the heel appears to be doing quite well. Fortunately there does not appear to be any signs of active infection at this time which is great news. Fortunately I think that he is making excellent headway with the current treatment regimen. 06-01-2023 upon evaluation today patient appears to be doing excellent in regard to his wound on the heel this is actually showing signs of being smaller measuring better and looking like is doing quite well. Fortunately I do not see any signs of infection at this time. He does have a small wound on the leg which we can also need to address this looks like it was just a small blister that ruptured I think it should heal pretty quickly. 06-07-2023 upon evaluation today patient appears to be doing well currently in regard to his wound. He has been tolerating the dressing changes without complication. Fortunately there does not appear to be any signs of active infection locally or  systemically at this time. No fevers, chills, nausea, vomiting, or diarrhea. [name], [name] ([id number]) [id number] Page 6 of 14 8/26; wound near the tip of the left heel. He is a diabetic. We have been using Hydrofera Blue under Urgo K2 lite wrap he has been in his surgical shoe although he tells me it disintegrated and he came in on a regular running shoe today. 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 He is not wearing compression stocking 06-22-2023 upon evaluation today patient appears to be doing excellent in regard to his wound on the heel which is actually significantly smaller compared to last week's evaluation. Fortunately I do not see any signs of active infection locally or systemically at this time. 06-28-2023 upon evaluation today patient appears to be doing well currently in regard to his heel which is actually showing signs of improvement. Fortunately I do not see any evidence of active infection at this time which is great news and in general I do believe that we are making headway towards complete closure which is good news. No fevers, chills, nausea, vomiting, or diarrhea. Electronic Signature(s) Signed: 06/28/2023 3:02:07 PM By: [name] Derry PA-C Previous Signature: 06/28/2023 2:51:31 PM Version By: [name] Derry PA-C Entered By: [name] Derry on 06/28/2023 15:02:07 -------------------------------------------------------------------------------- Physical Exam Details Patient Name: Date of Service: [name], [name] [name] 06/28/2023 1:45 PM Medical Record Number: [id number] Patient Account Number: [id number] Date of Birth/Sex: Treating RN: 1945/11/03 (76 y.o. [name] Primary Care Provider: [name] Other Clinician: Betha Loa Referring Provider: Treating Provider/Extender: Hermine Messick Weeks in Treatment: 82 Constitutional Well-nourished and well-hydrated in no acute distress. Respiratory normal  breathing without difficulty. Psychiatric this patient is able to make decisions and demonstrates good insight into disease process. Alert and Oriented x 3. pleasant and cooperative. Notes Upon inspection patient's wound showed signs of good granulation epithelization at this point. Fortunately I do not see any signs of worsening overall and I do believe that the patient is making headway towards closure. I do think that he is going require some debridement clearway slough and biofilm down to good subcutaneous tissue also removed some of the callus around there was 1 area when removing the callus that was bleeding I used a  this this visit. 05-18-2023 upon evaluation today patient's wound actually showing signs of improvement. Fortunately I do not see any signs of active infection locally or systemically which is great news. No fevers, chills, nausea, vomiting, or diarrhea. 05-24-2023 upon evaluation today patient's wound on the heel appears to be doing quite well. Fortunately there does not appear to be any signs of active infection at this time which is great news. Fortunately I think that he is making excellent headway with the current treatment regimen. 06-01-2023 upon evaluation today patient appears to be doing excellent in regard to his wound on the heel this is actually showing signs of being smaller measuring better and looking like is doing quite well. Fortunately I do not see any signs of infection at this time. He does have a small wound on the leg which we can also need to address this looks like it was just a small blister that ruptured I think it should heal pretty quickly. 06-07-2023 upon evaluation today patient appears to be doing well currently in regard to his wound. He has been tolerating the dressing changes without complication. Fortunately there does not appear to be any signs of active infection locally or  systemically at this time. No fevers, chills, nausea, vomiting, or diarrhea. [name], [name] ([id number]) [id number] Page 6 of 14 8/26; wound near the tip of the left heel. He is a diabetic. We have been using Hydrofera Blue under Urgo K2 lite wrap he has been in his surgical shoe although he tells me it disintegrated and he came in on a regular running shoe today. 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 He is not wearing compression stocking 06-22-2023 upon evaluation today patient appears to be doing excellent in regard to his wound on the heel which is actually significantly smaller compared to last week's evaluation. Fortunately I do not see any signs of active infection locally or systemically at this time. 06-28-2023 upon evaluation today patient appears to be doing well currently in regard to his heel which is actually showing signs of improvement. Fortunately I do not see any evidence of active infection at this time which is great news and in general I do believe that we are making headway towards complete closure which is good news. No fevers, chills, nausea, vomiting, or diarrhea. Electronic Signature(s) Signed: 06/28/2023 3:02:07 PM By: [name] Derry PA-C Previous Signature: 06/28/2023 2:51:31 PM Version By: [name] Derry PA-C Entered By: [name] Derry on 06/28/2023 15:02:07 -------------------------------------------------------------------------------- Physical Exam Details Patient Name: Date of Service: [name], [name] [name] 06/28/2023 1:45 PM Medical Record Number: [id number] Patient Account Number: [id number] Date of Birth/Sex: Treating RN: 1945/11/03 (76 y.o. [name] Primary Care Provider: [name] Other Clinician: Betha Loa Referring Provider: Treating Provider/Extender: Hermine Messick Weeks in Treatment: 82 Constitutional Well-nourished and well-hydrated in no acute distress. Respiratory normal  breathing without difficulty. Psychiatric this patient is able to make decisions and demonstrates good insight into disease process. Alert and Oriented x 3. pleasant and cooperative. Notes Upon inspection patient's wound showed signs of good granulation epithelization at this point. Fortunately I do not see any signs of worsening overall and I do believe that the patient is making headway towards closure. I do think that he is going require some debridement clearway slough and biofilm down to good subcutaneous tissue also removed some of the callus around there was 1 area when removing the callus that was bleeding I used a  SABIEN, MAKAREWICZ ([id number]) [id number] Page 1 of 14 Visit Report for 06/28/2023 Chief Complaint Document Details Patient Name: Date of Service: [name], [name] [facility] J. 06/28/2023 1:45 PM Medical Record Number: [id number] Patient Account Number: [id number] Date of Birth/Sex: Treating RN: 1946/09/04 (77 y.o. [name]) [name] Primary Care Provider: [name] Other Clinician: Betha Loa Referring Provider: Treating Provider/Extender: [name] in Treatment: 35 Information Obtained from: Patient Chief Complaint Left heel ulcer Electronic Signature(s) Signed: 06/28/2023 1:45:26 PM By: [name] Derry PA-C Entered By: [name] Derry on 06/28/2023 13:45:26 -------------------------------------------------------------------------------- Debridement Details Patient Name: Date of Service: [name], [name] [name] 06/28/2023 1:45 PM Medical Record Number: [id number] Patient Account Number: [id number] Date of Birth/Sex: Treating RN: 03/19/1946 (77 y.o. [name]) [name] Primary Care Provider: [name] Other Clinician: Betha Loa Referring Provider: Treating Provider/Extender: [name] in Treatment: 53 Debridement Performed for Assessment: Wound #10 Left Calcaneus Performed By: Physician [name] Derry, PA-C Debridement Type: Debridement Severity of Tissue Pre Debridement: Fat layer exposed Level of Consciousness (Pre-procedure): Awake and Alert Pre-procedure Verification/Time Out Yes - 14:55 Taken: Start Time: 14:55 Percent of Wound Bed Debrided: 100% T Area Debrided (cm): otal 0.09 Tissue and other material debrided: Viable, Non-Viable, Callus, Slough, Slough Level: Non-Viable Tissue Debridement Description: Selective/Open Wound Instrument: Curette Bleeding: Moderate Hemostasis Achieved: Silver Nitrate Response to Treatment: Procedure was tolerated well Level of Consciousness (Post- Awake and  Alert procedure): ALISA, APPLEYARD ([id number]) [phone].pdf Page 2 of 14 Post Debridement Measurements of Total Wound Length: (cm) 0.3 Width: (cm) 0.4 Depth: (cm) 0.1 Volume: (cm) 0.009 Character of Wound/Ulcer Post Debridement: Stable Severity of Tissue Post Debridement: Fat layer exposed Post Procedure Diagnosis Same as Pre-procedure Notes one silver nitrate stick used to stop bleeding Electronic Signature(s) Signed: 06/29/2023 4:31:19 PM By: Betha Loa Signed: 07/01/2023 4:31:22 PM By: [name] Derry PA-C Signed: 07/02/2023 1:15:19 PM By: [name] RN Entered By: Betha Loa on 06/28/2023 14:57:15 -------------------------------------------------------------------------------- HPI Details Patient Name: Date of Service: [name], [name] [name] 06/28/2023 1:45 PM Medical Record Number: [id number] Patient Account Number: [id number] Date of Birth/Sex: Treating RN: 1946-09-06 (77 y.o. 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 He has been seeing podiatry and they have been attempting to manage this including what sounds to be a total contact cast, Unna boot, and just standard dressings otherwise as well. Most recently has been using triple antibiotic ointment. With that being said unfortunately despite everything he really has not had any significant improvement. He tells me that he cannot even really remember exactly how this began but he presumed it may have rubbed on his shoes or something of that nature. 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 He tells me that this does seem to help. Fortunately there is no signs of active infection at this time. His most recent hemoglobin A1c was 8.1 though he knows this was this year he cannot tell me the exact time. His  this this visit. 05-18-2023 upon evaluation today patient's wound actually showing signs of improvement. Fortunately I do not see any signs of active infection locally or systemically which is great news. No fevers, chills, nausea, vomiting, or diarrhea. 05-24-2023 upon evaluation today patient's wound on the heel appears to be doing quite well. Fortunately there does not appear to be any signs of active infection at this time which is great news. Fortunately I think that he is making excellent headway with the current treatment regimen. 06-01-2023 upon evaluation today patient appears to be doing excellent in regard to his wound on the heel this is actually showing signs of being smaller measuring better and looking like is doing quite well. Fortunately I do not see any signs of infection at this time. He does have a small wound on the leg which we can also need to address this looks like it was just a small blister that ruptured I think it should heal pretty quickly. 06-07-2023 upon evaluation today patient appears to be doing well currently in regard to his wound. He has been tolerating the dressing changes without complication. Fortunately there does not appear to be any signs of active infection locally or  systemically at this time. No fevers, chills, nausea, vomiting, or diarrhea. [name], [name] ([id number]) [id number] Page 6 of 14 8/26; wound near the tip of the left heel. He is a diabetic. We have been using Hydrofera Blue under Urgo K2 lite wrap he has been in his surgical shoe although he tells me it disintegrated and he came in on a regular running shoe today. 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 He is not wearing compression stocking 06-22-2023 upon evaluation today patient appears to be doing excellent in regard to his wound on the heel which is actually significantly smaller compared to last week's evaluation. Fortunately I do not see any signs of active infection locally or systemically at this time. 06-28-2023 upon evaluation today patient appears to be doing well currently in regard to his heel which is actually showing signs of improvement. Fortunately I do not see any evidence of active infection at this time which is great news and in general I do believe that we are making headway towards complete closure which is good news. No fevers, chills, nausea, vomiting, or diarrhea. Electronic Signature(s) Signed: 06/28/2023 3:02:07 PM By: [name] Derry PA-C Previous Signature: 06/28/2023 2:51:31 PM Version By: [name] Derry PA-C Entered By: [name] Derry on 06/28/2023 15:02:07 -------------------------------------------------------------------------------- Physical Exam Details Patient Name: Date of Service: [name], [name] [name] 06/28/2023 1:45 PM Medical Record Number: [id number] Patient Account Number: [id number] Date of Birth/Sex: Treating RN: 1945/11/03 (76 y.o. [name] Primary Care Provider: [name] Other Clinician: Betha Loa Referring Provider: Treating Provider/Extender: Hermine Messick Weeks in Treatment: 82 Constitutional Well-nourished and well-hydrated in no acute distress. Respiratory normal  breathing without difficulty. Psychiatric this patient is able to make decisions and demonstrates good insight into disease process. Alert and Oriented x 3. pleasant and cooperative. Notes Upon inspection patient's wound showed signs of good granulation epithelization at this point. Fortunately I do not see any signs of worsening overall and I do believe that the patient is making headway towards closure. I do think that he is going require some debridement clearway slough and biofilm down to good subcutaneous tissue also removed some of the callus around there was 1 area when removing the callus that was bleeding I used a  SABIEN, MAKAREWICZ ([id number]) [id number] Page 1 of 14 Visit Report for 06/28/2023 Chief Complaint Document Details Patient Name: Date of Service: [name], [name] [facility] J. 06/28/2023 1:45 PM Medical Record Number: [id number] Patient Account Number: [id number] Date of Birth/Sex: Treating RN: 1946/09/04 (77 y.o. [name]) [name] Primary Care Provider: [name] Other Clinician: Betha [place] Referring Provider: Treating Provider/Extender: [name] in Treatment: 35 Information Obtained from: Patient Chief Complaint Left heel ulcer Electronic Signature(s) Signed: 06/28/2023 1:45:26 PM By: [name] Derry PA-C Entered By: [name] Derry on 06/28/2023 13:45:26 -------------------------------------------------------------------------------- Debridement Details Patient Name: Date of Service: [name], [name] [name] 06/28/2023 1:45 PM Medical Record Number: [id number] Patient Account Number: [id number] Date of Birth/Sex: Treating RN: 03/19/1946 (77 y.o. [name]) [name] Primary Care Provider: [name] Other Clinician: Betha Loa Referring Provider: Treating Provider/Extender: [name] in Treatment: 53 Debridement Performed for Assessment: Wound #10 Left Calcaneus Performed By: Physician [name] Derry, PA-C Debridement Type: Debridement Severity of Tissue Pre Debridement: Fat layer exposed Level of Consciousness (Pre-procedure): Awake and Alert Pre-procedure Verification/Time Out Yes - 14:55 Taken: Start Time: 14:55 Percent of Wound Bed Debrided: 100% T Area Debrided (cm): otal 0.09 Tissue and other material debrided: Viable, Non-Viable, Callus, Slough, Slough Level: Non-Viable Tissue Debridement Description: Selective/Open Wound Instrument: Curette Bleeding: Moderate Hemostasis Achieved: Silver Nitrate Response to Treatment: Procedure was tolerated well Level of Consciousness (Post- Awake and  Alert procedure): ALISA, APPLEYARD ([id number]) [phone].pdf Page 2 of 14 Post Debridement Measurements of Total Wound Length: (cm) 0.3 Width: (cm) 0.4 Depth: (cm) 0.1 Volume: (cm) 0.009 Character of Wound/Ulcer Post Debridement: Stable Severity of Tissue Post Debridement: Fat layer exposed Post Procedure Diagnosis Same as Pre-procedure Notes one silver nitrate stick used to stop bleeding Electronic Signature(s) Signed: 06/29/2023 4:31:19 PM By: Betha Loa Signed: 07/01/2023 4:31:22 PM By: [name] Derry PA-C Signed: 07/02/2023 1:15:19 PM By: [name] RN Entered By: Betha Loa on 06/28/2023 14:57:15 -------------------------------------------------------------------------------- HPI Details Patient Name: Date of Service: [name], [name] [name] 06/28/2023 1:45 PM Medical Record Number: [id number] Patient Account Number: [id number] Date of Birth/Sex: Treating RN: 1946-09-06 (77 y.o. 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 He has been seeing podiatry and they have been attempting to manage this including what sounds to be a total contact cast, Unna boot, and just standard dressings otherwise as well. Most recently has been using triple antibiotic ointment. With that being said unfortunately despite everything he really has not had any significant improvement. He tells me that he cannot even really remember exactly how this began but he presumed it may have rubbed on his shoes or something of that nature. 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 He tells me that this does seem to help. Fortunately there is no signs of active infection at this time. His most recent hemoglobin A1c was 8.1 though he knows this was this year he cannot tell me the exact time. His  this this visit. 05-18-2023 upon evaluation today patient's wound actually showing signs of improvement. Fortunately I do not see any signs of active infection locally or systemically which is great news. No fevers, chills, nausea, vomiting, or diarrhea. 05-24-2023 upon evaluation today patient's wound on the heel appears to be doing quite well. Fortunately there does not appear to be any signs of active infection at this time which is great news. Fortunately I think that he is making excellent headway with the current treatment regimen. 06-01-2023 upon evaluation today patient appears to be doing excellent in regard to his wound on the heel this is actually showing signs of being smaller measuring better and looking like is doing quite well. Fortunately I do not see any signs of infection at this time. He does have a small wound on the leg which we can also need to address this looks like it was just a small blister that ruptured I think it should heal pretty quickly. 06-07-2023 upon evaluation today patient appears to be doing well currently in regard to his wound. He has been tolerating the dressing changes without complication. Fortunately there does not appear to be any signs of active infection locally or  systemically at this time. No fevers, chills, nausea, vomiting, or diarrhea. [name], [name] ([id number]) [id number] Page 6 of 14 8/26; wound near the tip of the left heel. He is a diabetic. We have been using Hydrofera Blue under Urgo K2 lite wrap he has been in his surgical shoe although he tells me it disintegrated and he came in on a regular running shoe today. 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 He is not wearing compression stocking 06-22-2023 upon evaluation today patient appears to be doing excellent in regard to his wound on the heel which is actually significantly smaller compared to last week's evaluation. Fortunately I do not see any signs of active infection locally or systemically at this time. 06-28-2023 upon evaluation today patient appears to be doing well currently in regard to his heel which is actually showing signs of improvement. Fortunately I do not see any evidence of active infection at this time which is great news and in general I do believe that we are making headway towards complete closure which is good news. No fevers, chills, nausea, vomiting, or diarrhea. Electronic Signature(s) Signed: 06/28/2023 3:02:07 PM By: [name] Derry PA-C Previous Signature: 06/28/2023 2:51:31 PM Version By: [name] Derry PA-C Entered By: [name] Derry on 06/28/2023 15:02:07 -------------------------------------------------------------------------------- Physical Exam Details Patient Name: Date of Service: [name], [name] [name] 06/28/2023 1:45 PM Medical Record Number: [id number] Patient Account Number: [id number] Date of Birth/Sex: Treating RN: 1945/11/03 (76 y.o. [name] Primary Care Provider: [name] Other Clinician: Betha Loa Referring Provider: Treating Provider/Extender: Hermine Messick Weeks in Treatment: 82 Constitutional Well-nourished and well-hydrated in no acute distress. Respiratory normal  breathing without difficulty. Psychiatric this patient is able to make decisions and demonstrates good insight into disease process. Alert and Oriented x 3. pleasant and cooperative. Notes Upon inspection patient's wound showed signs of good granulation epithelization at this point. Fortunately I do not see any signs of worsening overall and I do believe that the patient is making headway towards closure. I do think that he is going require some debridement clearway slough and biofilm down to good subcutaneous tissue also removed some of the callus around there was 1 area when removing the callus that was bleeding I used a  this this visit. 05-18-2023 upon evaluation today patient's wound actually showing signs of improvement. Fortunately I do not see any signs of active infection locally or systemically which is great news. No fevers, chills, nausea, vomiting, or diarrhea. 05-24-2023 upon evaluation today patient's wound on the heel appears to be doing quite well. Fortunately there does not appear to be any signs of active infection at this time which is great news. Fortunately I think that he is making excellent headway with the current treatment regimen. 06-01-2023 upon evaluation today patient appears to be doing excellent in regard to his wound on the heel this is actually showing signs of being smaller measuring better and looking like is doing quite well. Fortunately I do not see any signs of infection at this time. He does have a small wound on the leg which we can also need to address this looks like it was just a small blister that ruptured I think it should heal pretty quickly. 06-07-2023 upon evaluation today patient appears to be doing well currently in regard to his wound. He has been tolerating the dressing changes without complication. Fortunately there does not appear to be any signs of active infection locally or  systemically at this time. No fevers, chills, nausea, vomiting, or diarrhea. [name], [name] ([id number]) [id number] Page 6 of 14 8/26; wound near the tip of the left heel. He is a diabetic. We have been using Hydrofera Blue under Urgo K2 lite wrap he has been in his surgical shoe although he tells me it disintegrated and he came in on a regular running shoe today. 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 He is not wearing compression stocking 06-22-2023 upon evaluation today patient appears to be doing excellent in regard to his wound on the heel which is actually significantly smaller compared to last week's evaluation. Fortunately I do not see any signs of active infection locally or systemically at this time. 06-28-2023 upon evaluation today patient appears to be doing well currently in regard to his heel which is actually showing signs of improvement. Fortunately I do not see any evidence of active infection at this time which is great news and in general I do believe that we are making headway towards complete closure which is good news. No fevers, chills, nausea, vomiting, or diarrhea. Electronic Signature(s) Signed: 06/28/2023 3:02:07 PM By: [name] Derry PA-C Previous Signature: 06/28/2023 2:51:31 PM Version By: [name] Derry PA-C Entered By: [name] Derry on 06/28/2023 15:02:07 -------------------------------------------------------------------------------- Physical Exam Details Patient Name: Date of Service: [name], [name] [name] 06/28/2023 1:45 PM Medical Record Number: [id number] Patient Account Number: [id number] Date of Birth/Sex: Treating RN: 1945/11/03 (76 y.o. [name] Primary Care Provider: [name] Other Clinician: Betha Loa Referring Provider: Treating Provider/Extender: Hermine Messick Weeks in Treatment: 82 Constitutional Well-nourished and well-hydrated in no acute distress. Respiratory normal  breathing without difficulty. Psychiatric this patient is able to make decisions and demonstrates good insight into disease process. Alert and Oriented x 3. pleasant and cooperative. Notes Upon inspection patient's wound showed signs of good granulation epithelization at this point. Fortunately I do not see any signs of worsening overall and I do believe that the patient is making headway towards closure. I do think that he is going require some debridement clearway slough and biofilm down to good subcutaneous tissue also removed some of the callus around there was 1 area when removing the callus that was bleeding I used a  this this visit. 05-18-2023 upon evaluation today patient's wound actually showing signs of improvement. Fortunately I do not see any signs of active infection locally or systemically which is great news. No fevers, chills, nausea, vomiting, or diarrhea. 05-24-2023 upon evaluation today patient's wound on the heel appears to be doing quite well. Fortunately there does not appear to be any signs of active infection at this time which is great news. Fortunately I think that he is making excellent headway with the current treatment regimen. 06-01-2023 upon evaluation today patient appears to be doing excellent in regard to his wound on the heel this is actually showing signs of being smaller measuring better and looking like is doing quite well. Fortunately I do not see any signs of infection at this time. He does have a small wound on the leg which we can also need to address this looks like it was just a small blister that ruptured I think it should heal pretty quickly. 06-07-2023 upon evaluation today patient appears to be doing well currently in regard to his wound. He has been tolerating the dressing changes without complication. Fortunately there does not appear to be any signs of active infection locally or  systemically at this time. No fevers, chills, nausea, vomiting, or diarrhea. [name], [name] ([id number]) [id number] Page 6 of 14 8/26; wound near the tip of the left heel. He is a diabetic. We have been using Hydrofera Blue under Urgo K2 lite wrap he has been in his surgical shoe although he tells me it disintegrated and he came in on a regular running shoe today. 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 He is not wearing compression stocking 06-22-2023 upon evaluation today patient appears to be doing excellent in regard to his wound on the heel which is actually significantly smaller compared to last week's evaluation. Fortunately I do not see any signs of active infection locally or systemically at this time. 06-28-2023 upon evaluation today patient appears to be doing well currently in regard to his heel which is actually showing signs of improvement. Fortunately I do not see any evidence of active infection at this time which is great news and in general I do believe that we are making headway towards complete closure which is good news. No fevers, chills, nausea, vomiting, or diarrhea. Electronic Signature(s) Signed: 06/28/2023 3:02:07 PM By: [name] Derry PA-C Previous Signature: 06/28/2023 2:51:31 PM Version By: [name] Derry PA-C Entered By: [name] Derry on 06/28/2023 15:02:07 -------------------------------------------------------------------------------- Physical Exam Details Patient Name: Date of Service: [name], [name] [name] 06/28/2023 1:45 PM Medical Record Number: [id number] Patient Account Number: [id number] Date of Birth/Sex: Treating RN: 1945/11/03 (76 y.o. [name] Primary Care Provider: [name] Other Clinician: Betha Loa Referring Provider: Treating Provider/Extender: Hermine Messick Weeks in Treatment: 82 Constitutional Well-nourished and well-hydrated in no acute distress. Respiratory normal  breathing without difficulty. Psychiatric this patient is able to make decisions and demonstrates good insight into disease process. Alert and Oriented x 3. pleasant and cooperative. Notes Upon inspection patient's wound showed signs of good granulation epithelization at this point. Fortunately I do not see any signs of worsening overall and I do believe that the patient is making headway towards closure. I do think that he is going require some debridement clearway slough and biofilm down to good subcutaneous tissue also removed some of the callus around there was 1 area when removing the callus that was bleeding I used a  this this visit. 05-18-2023 upon evaluation today patient's wound actually showing signs of improvement. Fortunately I do not see any signs of active infection locally or systemically which is great news. No fevers, chills, nausea, vomiting, or diarrhea. 05-24-2023 upon evaluation today patient's wound on the heel appears to be doing quite well. Fortunately there does not appear to be any signs of active infection at this time which is great news. Fortunately I think that he is making excellent headway with the current treatment regimen. 06-01-2023 upon evaluation today patient appears to be doing excellent in regard to his wound on the heel this is actually showing signs of being smaller measuring better and looking like is doing quite well. Fortunately I do not see any signs of infection at this time. He does have a small wound on the leg which we can also need to address this looks like it was just a small blister that ruptured I think it should heal pretty quickly. 06-07-2023 upon evaluation today patient appears to be doing well currently in regard to his wound. He has been tolerating the dressing changes without complication. Fortunately there does not appear to be any signs of active infection locally or  systemically at this time. No fevers, chills, nausea, vomiting, or diarrhea. [name], [name] ([id number]) [id number] Page 6 of 14 8/26; wound near the tip of the left heel. He is a diabetic. We have been using Hydrofera Blue under Urgo K2 lite wrap he has been in his surgical shoe although he tells me it disintegrated and he came in on a regular running shoe today. 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 He is not wearing compression stocking 06-22-2023 upon evaluation today patient appears to be doing excellent in regard to his wound on the heel which is actually significantly smaller compared to last week's evaluation. Fortunately I do not see any signs of active infection locally or systemically at this time. 06-28-2023 upon evaluation today patient appears to be doing well currently in regard to his heel which is actually showing signs of improvement. Fortunately I do not see any evidence of active infection at this time which is great news and in general I do believe that we are making headway towards complete closure which is good news. No fevers, chills, nausea, vomiting, or diarrhea. Electronic Signature(s) Signed: 06/28/2023 3:02:07 PM By: [name] Derry PA-C Previous Signature: 06/28/2023 2:51:31 PM Version By: [name] Derry PA-C Entered By: [name] Derry on 06/28/2023 15:02:07 -------------------------------------------------------------------------------- Physical Exam Details Patient Name: Date of Service: [name], [name] [name] 06/28/2023 1:45 PM Medical Record Number: [id number] Patient Account Number: [id number] Date of Birth/Sex: Treating RN: 1945/11/03 (76 y.o. [name] Primary Care Provider: [name] Other Clinician: Betha Loa Referring Provider: Treating Provider/Extender: Hermine Messick Weeks in Treatment: 82 Constitutional Well-nourished and well-hydrated in no acute distress. Respiratory normal  breathing without difficulty. Psychiatric this patient is able to make decisions and demonstrates good insight into disease process. Alert and Oriented x 3. pleasant and cooperative. Notes Upon inspection patient's wound showed signs of good granulation epithelization at this point. Fortunately I do not see any signs of worsening overall and I do believe that the patient is making headway towards closure. I do think that he is going require some debridement clearway slough and biofilm down to good subcutaneous tissue also removed some of the callus around there was 1 area when removing the callus that was bleeding I used a  SABIEN, MAKAREWICZ ([id number]) [id number] Page 1 of 14 Visit Report for 06/28/2023 Chief Complaint Document Details Patient Name: Date of Service: [name], [name] [facility] J. 06/28/2023 1:45 PM Medical Record Number: [id number] Patient Account Number: [id number] Date of Birth/Sex: Treating RN: 1946/09/04 (77 y.o. [name]) [name] Primary Care Provider: [name] Other Clinician: Betha Loa Referring Provider: Treating Provider/Extender: [name] in Treatment: 35 Information Obtained from: Patient Chief Complaint Left heel ulcer Electronic Signature(s) Signed: 06/28/2023 1:45:26 PM By: [name] Derry PA-C Entered By: [name] Derry on 06/28/2023 13:45:26 -------------------------------------------------------------------------------- Debridement Details Patient Name: Date of Service: [name], [name] [name] 06/28/2023 1:45 PM Medical Record Number: [id number] Patient Account Number: [id number] Date of Birth/Sex: Treating RN: 03/19/1946 (77 y.o. [name]) [name] Primary Care Provider: [name] Other Clinician: Betha Loa Referring Provider: Treating Provider/Extender: [name] in Treatment: 53 Debridement Performed for Assessment: Wound #10 Left Calcaneus Performed By: Physician [name] Derry, PA-C Debridement Type: Debridement Severity of Tissue Pre Debridement: Fat layer exposed Level of Consciousness (Pre-procedure): Awake and Alert Pre-procedure Verification/Time Out Yes - 14:55 Taken: Start Time: 14:55 Percent of Wound Bed Debrided: 100% T Area Debrided (cm): otal 0.09 Tissue and other material debrided: Viable, Non-Viable, Callus, Slough, Slough Level: Non-Viable Tissue Debridement Description: Selective/Open Wound Instrument: Curette Bleeding: Moderate Hemostasis Achieved: Silver Nitrate Response to Treatment: Procedure was tolerated well Level of Consciousness (Post- Awake and  Alert procedure): ALISA, APPLEYARD ([id number]) [phone].pdf Page 2 of 14 Post Debridement Measurements of Total Wound Length: (cm) 0.3 Width: (cm) 0.4 Depth: (cm) 0.1 Volume: (cm) 0.009 Character of Wound/Ulcer Post Debridement: Stable Severity of Tissue Post Debridement: Fat layer exposed Post Procedure Diagnosis Same as Pre-procedure Notes one silver nitrate stick used to stop bleeding Electronic Signature(s) Signed: 06/29/2023 4:31:19 PM By: Betha Loa Signed: 07/01/2023 4:31:22 PM By: [name] Derry PA-C Signed: 07/02/2023 1:15:19 PM By: [name] RN Entered By: Betha Loa on 06/28/2023 14:57:15 -------------------------------------------------------------------------------- HPI Details Patient Name: Date of Service: [name], [name] [name] 06/28/2023 1:45 PM Medical Record Number: [id number] Patient Account Number: [id number] Date of Birth/Sex: Treating RN: 1946-09-06 (77 y.o. 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 He has been seeing podiatry and they have been attempting to manage this including what sounds to be a total contact cast, Unna boot, and just standard dressings otherwise as well. Most recently has been using triple antibiotic ointment. With that being said unfortunately despite everything he really has not had any significant improvement. He tells me that he cannot even really remember exactly how this began but he presumed it may have rubbed on his shoes or something of that nature. 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 He tells me that this does seem to help. Fortunately there is no signs of active infection at this time. His most recent hemoglobin A1c was 8.1 though he knows this was this year he cannot tell me the exact time. His  SABIEN, MAKAREWICZ ([id number]) [id number] Page 1 of 14 Visit Report for 06/28/2023 Chief Complaint Document Details Patient Name: Date of Service: [name], [name] [facility] J. 06/28/2023 1:45 PM Medical Record Number: [id number] Patient Account Number: [id number] Date of Birth/Sex: Treating RN: 1946/09/04 (77 y.o. [name]) [name] Primary Care Provider: [name] Other Clinician: Betha Loa Referring Provider: Treating Provider/Extender: [name] in Treatment: 35 Information Obtained from: Patient Chief Complaint Left heel ulcer Electronic Signature(s) Signed: 06/28/2023 1:45:26 PM By: [name] Derry PA-C Entered By: [name] Derry on 06/28/2023 13:45:26 -------------------------------------------------------------------------------- Debridement Details Patient Name: Date of Service: [name], [name] [name] 06/28/2023 1:45 PM Medical Record Number: [id number] Patient Account Number: [id number] Date of Birth/Sex: Treating RN: 03/19/1946 (77 y.o. [name]) [name] Primary Care Provider: [name] Other Clinician: Betha Loa Referring Provider: Treating Provider/Extender: [name] in Treatment: 53 Debridement Performed for Assessment: Wound #10 Left Calcaneus Performed By: Physician [name] Derry, PA-C Debridement Type: Debridement Severity of Tissue Pre Debridement: Fat layer exposed Level of Consciousness (Pre-procedure): Awake and Alert Pre-procedure Verification/Time Out Yes - 14:55 Taken: Start Time: 14:55 Percent of Wound Bed Debrided: 100% T Area Debrided (cm): otal 0.09 Tissue and other material debrided: Viable, Non-Viable, Callus, Slough, Slough Level: Non-Viable Tissue Debridement Description: Selective/Open Wound Instrument: Curette Bleeding: Moderate Hemostasis Achieved: Silver Nitrate Response to Treatment: Procedure was tolerated well Level of Consciousness (Post- Awake and  Alert procedure): ALISA, APPLEYARD ([id number]) [id number].pdf Page 2 of 14 Post Debridement Measurements of Total Wound Length: (cm) 0.3 Width: (cm) 0.4 Depth: (cm) 0.1 Volume: (cm) 0.009 Character of Wound/Ulcer Post Debridement: Stable Severity of Tissue Post Debridement: Fat layer exposed Post Procedure Diagnosis Same as Pre-procedure Notes one silver nitrate stick used to stop bleeding Electronic Signature(s) Signed: 06/29/2023 4:31:19 PM By: Betha Loa Signed: 07/01/2023 4:31:22 PM By: [name] Derry PA-C Signed: 07/02/2023 1:15:19 PM By: [name] RN Entered By: Betha Loa on 06/28/2023 14:57:15 -------------------------------------------------------------------------------- HPI Details Patient Name: Date of Service: [name], [name] [name] 06/28/2023 1:45 PM Medical Record Number: [id number] Patient Account Number: [id number] Date of Birth/Sex: Treating RN: 1946-09-06 (77 y.o. 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 He has been seeing podiatry and they have been attempting to manage this including what sounds to be a total contact cast, Unna boot, and just standard dressings otherwise as well. Most recently has been using triple antibiotic ointment. With that being said unfortunately despite everything he really has not had any significant improvement. He tells me that he cannot even really remember exactly how this began but he presumed it may have rubbed on his shoes or something of that nature. 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 He tells me that this does seem to help. Fortunately there is no signs of active infection at this time. His most recent hemoglobin A1c was 8.1 though he knows this was this year he cannot tell me the exact time. His  SABIEN, MAKAREWICZ ([id number]) [id number] Page 1 of 14 Visit Report for 06/28/2023 Chief Complaint Document Details Patient Name: Date of Service: [name], [name] [facility] J. 06/28/2023 1:45 PM Medical Record Number: [id number] Patient Account Number: [id number] Date of Birth/Sex: Treating RN: 1946/09/04 (77 y.o. [name]) [name] Primary Care Provider: [name] Other Clinician: Betha Loa Referring Provider: Treating Provider/Extender: [name] in Treatment: 35 Information Obtained from: Patient Chief Complaint Left heel ulcer Electronic Signature(s) Signed: 06/28/2023 1:45:26 PM By: [name] Derry PA-C Entered By: [name] Derry on 06/28/2023 13:45:26 -------------------------------------------------------------------------------- Debridement Details Patient Name: Date of Service: [name], [name] [name] 06/28/2023 1:45 PM Medical Record Number: [id number] Patient Account Number: [id number] Date of Birth/Sex: Treating RN: 03/19/1946 (77 y.o. [name]) [name] Primary Care Provider: [name] Other Clinician: Betha Loa Referring Provider: Treating Provider/Extender: [name] in Treatment: 53 Debridement Performed for Assessment: Wound #10 Left Calcaneus Performed By: Physician [name] Derry, PA-C Debridement Type: Debridement Severity of Tissue Pre Debridement: Fat layer exposed Level of Consciousness (Pre-procedure): Awake and Alert Pre-procedure Verification/Time Out Yes - 14:55 Taken: Start Time: 14:55 Percent of Wound Bed Debrided: 100% T Area Debrided (cm): otal 0.09 Tissue and other material debrided: Viable, Non-Viable, Callus, Slough, Slough Level: Non-Viable Tissue Debridement Description: Selective/Open Wound Instrument: Curette Bleeding: Moderate Hemostasis Achieved: Silver Nitrate Response to Treatment: Procedure was tolerated well Level of Consciousness (Post- Awake and  Alert procedure): ALISA, APPLEYARD ([id number]) [id number].pdf Page 2 of 14 Post Debridement Measurements of Total Wound Length: (cm) 0.3 Width: (cm) 0.4 Depth: (cm) 0.1 Volume: (cm) 0.009 Character of Wound/Ulcer Post Debridement: Stable Severity of Tissue Post Debridement: Fat layer exposed Post Procedure Diagnosis Same as Pre-procedure Notes one silver nitrate stick used to stop bleeding Electronic Signature(s) Signed: 06/29/2023 4:31:19 PM By: Betha Loa Signed: 07/01/2023 4:31:22 PM By: [name] Derry PA-C Signed: 07/02/2023 1:15:19 PM By: [name] RN Entered By: Betha Loa on 06/28/2023 14:57:15 -------------------------------------------------------------------------------- HPI Details Patient Name: Date of Service: [name], [name] [name] 06/28/2023 1:45 PM Medical Record Number: [id number] Patient Account Number: [id number] Date of Birth/Sex: Treating RN: 1946-09-06 (77 y.o. 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 He has been seeing podiatry and they have been attempting to manage this including what sounds to be a total contact cast, Unna boot, and just standard dressings otherwise as well. Most recently has been using triple antibiotic ointment. With that being said unfortunately despite everything he really has not had any significant improvement. He tells me that he cannot even really remember exactly how this began but he presumed it may have rubbed on his shoes or something of that nature. 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 He tells me that this does seem to help. Fortunately there is no signs of active infection at this time. His most recent hemoglobin A1c was 8.1 though he knows this was this year he cannot tell me the exact time. His  this this visit. 05-18-2023 upon evaluation today patient's wound actually showing signs of improvement. Fortunately I do not see any signs of active infection locally or systemically which is great news. No fevers, chills, nausea, vomiting, or diarrhea. 05-24-2023 upon evaluation today patient's wound on the heel appears to be doing quite well. Fortunately there does not appear to be any signs of active infection at this time which is great news. Fortunately I think that he is making excellent headway with the current treatment regimen. 06-01-2023 upon evaluation today patient appears to be doing excellent in regard to his wound on the heel this is actually showing signs of being smaller measuring better and looking like is doing quite well. Fortunately I do not see any signs of infection at this time. He does have a small wound on the leg which we can also need to address this looks like it was just a small blister that ruptured I think it should heal pretty quickly. 06-07-2023 upon evaluation today patient appears to be doing well currently in regard to his wound. He has been tolerating the dressing changes without complication. Fortunately there does not appear to be any signs of active infection locally or  systemically at this time. No fevers, chills, nausea, vomiting, or diarrhea. [name], [name] ([id number]) [id number] Page 6 of 14 8/26; wound near the tip of the left heel. He is a diabetic. We have been using Hydrofera Blue under Urgo K2 lite wrap he has been in his surgical shoe although he tells me it disintegrated and he came in on a regular running shoe today. 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 He is not wearing compression stocking 06-22-2023 upon evaluation today patient appears to be doing excellent in regard to his wound on the heel which is actually significantly smaller compared to last week's evaluation. Fortunately I do not see any signs of active infection locally or systemically at this time. 06-28-2023 upon evaluation today patient appears to be doing well currently in regard to his heel which is actually showing signs of improvement. Fortunately I do not see any evidence of active infection at this time which is great news and in general I do believe that we are making headway towards complete closure which is good news. No fevers, chills, nausea, vomiting, or diarrhea. Electronic Signature(s) Signed: 06/28/2023 3:02:07 PM By: [name] Derry PA-C Previous Signature: 06/28/2023 2:51:31 PM Version By: [name] Derry PA-C Entered By: [name] Derry on 06/28/2023 15:02:07 -------------------------------------------------------------------------------- Physical Exam Details Patient Name: Date of Service: [name], [name] [name] 06/28/2023 1:45 PM Medical Record Number: [id number] Patient Account Number: [id number] Date of Birth/Sex: Treating RN: 1945/11/03 (76 y.o. [name] Primary Care Provider: [name] Other Clinician: Betha Loa Referring Provider: Treating Provider/Extender: Hermine Messick Weeks in Treatment: 82 Constitutional Well-nourished and well-hydrated in no acute distress. Respiratory normal  breathing without difficulty. Psychiatric this patient is able to make decisions and demonstrates good insight into disease process. Alert and Oriented x 3. pleasant and cooperative. Notes Upon inspection patient's wound showed signs of good granulation epithelization at this point. Fortunately I do not see any signs of worsening overall and I do believe that the patient is making headway towards closure. I do think that he is going require some debridement clearway slough and biofilm down to good subcutaneous tissue also removed some of the callus around there was 1 area when removing the callus that was bleeding I used a  SABIEN, MAKAREWICZ ([id number]) [id number] Page 1 of 14 Visit Report for 06/28/2023 Chief Complaint Document Details Patient Name: Date of Service: [name], [name] [facility] J. 06/28/2023 1:45 PM Medical Record Number: [id number] Patient Account Number: [id number] Date of Birth/Sex: Treating RN: 1946/09/04 (77 y.o. [name]) [name] Primary Care Provider: [name] Other Clinician: Betha Loa Referring Provider: Treating Provider/Extender: [name] in Treatment: 35 Information Obtained from: Patient Chief Complaint Left heel ulcer Electronic Signature(s) Signed: 06/28/2023 1:45:26 PM By: [name] Derry PA-C Entered By: [name] Derry on 06/28/2023 13:45:26 -------------------------------------------------------------------------------- Debridement Details Patient Name: Date of Service: [name], [name] [name] 06/28/2023 1:45 PM Medical Record Number: [id number] Patient Account Number: [id number] Date of Birth/Sex: Treating RN: 03/19/1946 (77 y.o. [name]) [name] Primary Care Provider: [name] Other Clinician: Betha Loa Referring Provider: Treating Provider/Extender: [name] in Treatment: 53 Debridement Performed for Assessment: Wound #10 Left Calcaneus Performed By: Physician [name] Derry, PA-C Debridement Type: Debridement Severity of Tissue Pre Debridement: Fat layer exposed Level of Consciousness (Pre-procedure): Awake and Alert Pre-procedure Verification/Time Out Yes - 14:55 Taken: Start Time: 14:55 Percent of Wound Bed Debrided: 100% T Area Debrided (cm): otal 0.09 Tissue and other material debrided: Viable, Non-Viable, Callus, Slough, Slough Level: Non-Viable Tissue Debridement Description: Selective/Open Wound Instrument: Curette Bleeding: Moderate Hemostasis Achieved: Silver Nitrate Response to Treatment: Procedure was tolerated well Level of Consciousness (Post- Awake and  Alert procedure): ALISA, APPLEYARD ([id number]) [id number].pdf Page 2 of 14 Post Debridement Measurements of Total Wound Length: (cm) 0.3 Width: (cm) 0.4 Depth: (cm) 0.1 Volume: (cm) 0.009 Character of Wound/Ulcer Post Debridement: Stable Severity of Tissue Post Debridement: Fat layer exposed Post Procedure Diagnosis Same as Pre-procedure Notes one silver nitrate stick used to stop bleeding Electronic Signature(s) Signed: 06/29/2023 4:31:19 PM By: Betha Loa Signed: 07/01/2023 4:31:22 PM By: [name] Derry PA-C Signed: 07/02/2023 1:15:19 PM By: [name] RN Entered By: Betha Loa on 06/28/2023 14:57:15 -------------------------------------------------------------------------------- HPI Details Patient Name: Date of Service: [name], [name] [name] 06/28/2023 1:45 PM Medical Record Number: [id number] Patient Account Number: [id number] Date of Birth/Sex: Treating RN: 1946-09-06 (77 y.o. 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 He has been seeing podiatry and they have been attempting to manage this including what sounds to be a total contact cast, Unna boot, and just standard dressings otherwise as well. Most recently has been using triple antibiotic ointment. With that being said unfortunately despite everything he really has not had any significant improvement. He tells me that he cannot even really remember exactly how this began but he presumed it may have rubbed on his shoes or something of that nature. 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 He tells me that this does seem to help. Fortunately there is no signs of active infection at this time. His most recent hemoglobin A1c was 8.1 though he knows this was this year he cannot tell me the exact time. His  SABIEN, MAKAREWICZ ([id number]) [id number] Page 1 of 14 Visit Report for 06/28/2023 Chief Complaint Document Details Patient Name: Date of Service: [name], [name] [facility] J. 06/28/2023 1:45 PM Medical Record Number: [id number] Patient Account Number: [id number] Date of Birth/Sex: Treating RN: 1946/09/04 (77 y.o. [name]) [name] Primary Care Provider: [name] Other Clinician: Betha Loa Referring Provider: Treating Provider/Extender: [name] in Treatment: 35 Information Obtained from: Patient Chief Complaint Left heel ulcer Electronic Signature(s) Signed: 06/28/2023 1:45:26 PM By: [name] Derry PA-C Entered By: [name] Derry on 06/28/2023 13:45:26 -------------------------------------------------------------------------------- Debridement Details Patient Name: Date of Service: [name], [name] [name] 06/28/2023 1:45 PM Medical Record Number: [id number] Patient Account Number: [id number] Date of Birth/Sex: Treating RN: 03/19/1946 (77 y.o. [name]) [name] Primary Care Provider: [name] Other Clinician: Betha Loa Referring Provider: Treating Provider/Extender: [name] in Treatment: 53 Debridement Performed for Assessment: Wound #10 Left Calcaneus Performed By: Physician [name] Derry, PA-C Debridement Type: Debridement Severity of Tissue Pre Debridement: Fat layer exposed Level of Consciousness (Pre-procedure): Awake and Alert Pre-procedure Verification/Time Out Yes - 14:55 Taken: Start Time: 14:55 Percent of Wound Bed Debrided: 100% T Area Debrided (cm): otal 0.09 Tissue and other material debrided: Viable, Non-Viable, Callus, Slough, Slough Level: Non-Viable Tissue Debridement Description: Selective/Open Wound Instrument: Curette Bleeding: Moderate Hemostasis Achieved: Silver Nitrate Response to Treatment: Procedure was tolerated well Level of Consciousness (Post- Awake and  Alert procedure): ALISA, APPLEYARD ([id number]) [id number].pdf Page 2 of 14 Post Debridement Measurements of Total Wound Length: (cm) 0.3 Width: (cm) 0.4 Depth: (cm) 0.1 Volume: (cm) 0.009 Character of Wound/Ulcer Post Debridement: Stable Severity of Tissue Post Debridement: Fat layer exposed Post Procedure Diagnosis Same as Pre-procedure Notes one silver nitrate stick used to stop bleeding Electronic Signature(s) Signed: 06/29/2023 4:31:19 PM By: Betha Loa Signed: 07/01/2023 4:31:22 PM By: [name] Derry PA-C Signed: 07/02/2023 1:15:19 PM By: [name] RN Entered By: Betha Loa on 06/28/2023 14:57:15 -------------------------------------------------------------------------------- HPI Details Patient Name: Date of Service: [name], [name] [name] 06/28/2023 1:45 PM Medical Record Number: [id number] Patient Account Number: [id number] Date of Birth/Sex: Treating RN: 1946-09-06 (77 y.o. 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 He has been seeing podiatry and they have been attempting to manage this including what sounds to be a total contact cast, Unna boot, and just standard dressings otherwise as well. Most recently has been using triple antibiotic ointment. With that being said unfortunately despite everything he really has not had any significant improvement. He tells me that he cannot even really remember exactly how this began but he presumed it may have rubbed on his shoes or something of that nature. 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 He tells me that this does seem to help. Fortunately there is no signs of active infection at this time. His most recent hemoglobin A1c was 8.1 though he knows this was this year he cannot tell me the exact time. His  this this visit. 05-18-2023 upon evaluation today patient's wound actually showing signs of improvement. Fortunately I do not see any signs of active infection locally or systemically which is great news. No fevers, chills, nausea, vomiting, or diarrhea. 05-24-2023 upon evaluation today patient's wound on the heel appears to be doing quite well. Fortunately there does not appear to be any signs of active infection at this time which is great news. Fortunately I think that he is making excellent headway with the current treatment regimen. 06-01-2023 upon evaluation today patient appears to be doing excellent in regard to his wound on the heel this is actually showing signs of being smaller measuring better and looking like is doing quite well. Fortunately I do not see any signs of infection at this time. He does have a small wound on the leg which we can also need to address this looks like it was just a small blister that ruptured I think it should heal pretty quickly. 06-07-2023 upon evaluation today patient appears to be doing well currently in regard to his wound. He has been tolerating the dressing changes without complication. Fortunately there does not appear to be any signs of active infection locally or  systemically at this time. No fevers, chills, nausea, vomiting, or diarrhea. [name], [name] ([id number]) [id number] Page 6 of 14 8/26; wound near the tip of the left heel. He is a diabetic. We have been using Hydrofera Blue under Urgo K2 lite wrap he has been in his surgical shoe although he tells me it disintegrated and he came in on a regular running shoe today. 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 He is not wearing compression stocking 06-22-2023 upon evaluation today patient appears to be doing excellent in regard to his wound on the heel which is actually significantly smaller compared to last week's evaluation. Fortunately I do not see any signs of active infection locally or systemically at this time. 06-28-2023 upon evaluation today patient appears to be doing well currently in regard to his heel which is actually showing signs of improvement. Fortunately I do not see any evidence of active infection at this time which is great news and in general I do believe that we are making headway towards complete closure which is good news. No fevers, chills, nausea, vomiting, or diarrhea. Electronic Signature(s) Signed: 06/28/2023 3:02:07 PM By: [name] Derry PA-C Previous Signature: 06/28/2023 2:51:31 PM Version By: [name] Derry PA-C Entered By: [name] Derry on 06/28/2023 15:02:07 -------------------------------------------------------------------------------- Physical Exam Details Patient Name: Date of Service: [name], [name] [name] 06/28/2023 1:45 PM Medical Record Number: [id number] Patient Account Number: [id number] Date of Birth/Sex: Treating RN: 1945/11/03 (76 y.o. [name] Primary Care Provider: [name] Other Clinician: Betha Loa Referring Provider: Treating Provider/Extender: Hermine Messick Weeks in Treatment: 82 Constitutional Well-nourished and well-hydrated in no acute distress. Respiratory normal  breathing without difficulty. Psychiatric this patient is able to make decisions and demonstrates good insight into disease process. Alert and Oriented x 3. pleasant and cooperative. Notes Upon inspection patient's wound showed signs of good granulation epithelization at this point. Fortunately I do not see any signs of worsening overall and I do believe that the patient is making headway towards closure. I do think that he is going require some debridement clearway slough and biofilm down to good subcutaneous tissue also removed some of the callus around there was 1 area when removing the callus that was bleeding I used a  SABIEN, MAKAREWICZ ([id number]) [id number] Page 1 of 14 Visit Report for 06/28/2023 Chief Complaint Document Details Patient Name: Date of Service: [name], [name] [facility] J. 06/28/2023 1:45 PM Medical Record Number: [id number] Patient Account Number: [id number] Date of Birth/Sex: Treating RN: 1946/09/04 (77 y.o. [name]) [name] Primary Care Provider: [name] Other Clinician: Betha Loa Referring Provider: Treating Provider/Extender: [name] in Treatment: 35 Information Obtained from: Patient Chief Complaint Left heel ulcer Electronic Signature(s) Signed: 06/28/2023 1:45:26 PM By: [name] Derry PA-C Entered By: [name] Derry on 06/28/2023 13:45:26 -------------------------------------------------------------------------------- Debridement Details Patient Name: Date of Service: [name], [name] [name] 06/28/2023 1:45 PM Medical Record Number: [id number] Patient Account Number: [id number] Date of Birth/Sex: Treating RN: 03/19/1946 (77 y.o. [name]) [name] Primary Care Provider: [name] Other Clinician: Betha Loa Referring Provider: Treating Provider/Extender: [name] in Treatment: 53 Debridement Performed for Assessment: Wound #10 Left Calcaneus Performed By: Physician [name] Derry, PA-C Debridement Type: Debridement Severity of Tissue Pre Debridement: Fat layer exposed Level of Consciousness (Pre-procedure): Awake and Alert Pre-procedure Verification/Time Out Yes - 14:55 Taken: Start Time: 14:55 Percent of Wound Bed Debrided: 100% T Area Debrided (cm): otal 0.09 Tissue and other material debrided: Viable, Non-Viable, Callus, Slough, Slough Level: Non-Viable Tissue Debridement Description: Selective/Open Wound Instrument: Curette Bleeding: Moderate Hemostasis Achieved: Silver Nitrate Response to Treatment: Procedure was tolerated well Level of Consciousness (Post- Awake and  Alert procedure): ALISA, APPLEYARD ([id number]) [phone].pdf Page 2 of 14 Post Debridement Measurements of Total Wound Length: (cm) 0.3 Width: (cm) 0.4 Depth: (cm) 0.1 Volume: (cm) 0.009 Character of Wound/Ulcer Post Debridement: Stable Severity of Tissue Post Debridement: Fat layer exposed Post Procedure Diagnosis Same as Pre-procedure Notes one silver nitrate stick used to stop bleeding Electronic Signature(s) Signed: 06/29/2023 4:31:19 PM By: Betha Loa Signed: 07/01/2023 4:31:22 PM By: [name] Derry PA-C Signed: 07/02/2023 1:15:19 PM By: [name] RN Entered By: Betha Loa on 06/28/2023 14:57:15 -------------------------------------------------------------------------------- HPI Details Patient Name: Date of Service: [name], [name] [name] 06/28/2023 1:45 PM Medical Record Number: [id number] Patient Account Number: [id number] Date of Birth/Sex: Treating RN: 1946-09-06 (77 y.o. 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 He has been seeing podiatry and they have been attempting to manage this including what sounds to be a total contact cast, Unna boot, and just standard dressings otherwise as well. Most recently has been using triple antibiotic ointment. With that being said unfortunately despite everything he really has not had any significant improvement. He tells me that he cannot even really remember exactly how this began but he presumed it may have rubbed on his shoes or something of that nature. 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 He tells me that this does seem to help. Fortunately there is no signs of active infection at this time. His most recent hemoglobin A1c was 8.1 though he knows this was this year he cannot tell me the exact time. His  this this visit. 05-18-2023 upon evaluation today patient's wound actually showing signs of improvement. Fortunately I do not see any signs of active infection locally or systemically which is great news. No fevers, chills, nausea, vomiting, or diarrhea. 05-24-2023 upon evaluation today patient's wound on the heel appears to be doing quite well. Fortunately there does not appear to be any signs of active infection at this time which is great news. Fortunately I think that he is making excellent headway with the current treatment regimen. 06-01-2023 upon evaluation today patient appears to be doing excellent in regard to his wound on the heel this is actually showing signs of being smaller measuring better and looking like is doing quite well. Fortunately I do not see any signs of infection at this time. He does have a small wound on the leg which we can also need to address this looks like it was just a small blister that ruptured I think it should heal pretty quickly. 06-07-2023 upon evaluation today patient appears to be doing well currently in regard to his wound. He has been tolerating the dressing changes without complication. Fortunately there does not appear to be any signs of active infection locally or  systemically at this time. No fevers, chills, nausea, vomiting, or diarrhea. [name], [name] ([id number]) [id number] Page 6 of 14 8/26; wound near the tip of the left heel. He is a diabetic. We have been using Hydrofera Blue under Urgo K2 lite wrap he has been in his surgical shoe although he tells me it disintegrated and he came in on a regular running shoe today. 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 He is not wearing compression stocking 06-22-2023 upon evaluation today patient appears to be doing excellent in regard to his wound on the heel which is actually significantly smaller compared to last week's evaluation. Fortunately I do not see any signs of active infection locally or systemically at this time. 06-28-2023 upon evaluation today patient appears to be doing well currently in regard to his heel which is actually showing signs of improvement. Fortunately I do not see any evidence of active infection at this time which is great news and in general I do believe that we are making headway towards complete closure which is good news. No fevers, chills, nausea, vomiting, or diarrhea. Electronic Signature(s) Signed: 06/28/2023 3:02:07 PM By: [name] Derry PA-C Previous Signature: 06/28/2023 2:51:31 PM Version By: [name] Derry PA-C Entered By: [name] Derry on 06/28/2023 15:02:07 -------------------------------------------------------------------------------- Physical Exam Details Patient Name: Date of Service: [name], [name] [name] 06/28/2023 1:45 PM Medical Record Number: [id number] Patient Account Number: [id number] Date of Birth/Sex: Treating RN: 1945/11/03 (76 y.o. [name] Primary Care Provider: [name] Other Clinician: Betha Loa Referring Provider: Treating Provider/Extender: Hermine Messick Weeks in Treatment: 82 Constitutional Well-nourished and well-hydrated in no acute distress. Respiratory normal  breathing without difficulty. Psychiatric this patient is able to make decisions and demonstrates good insight into disease process. Alert and Oriented x 3. pleasant and cooperative. Notes Upon inspection patient's wound showed signs of good granulation epithelization at this point. Fortunately I do not see any signs of worsening overall and I do believe that the patient is making headway towards closure. I do think that he is going require some debridement clearway slough and biofilm down to good subcutaneous tissue also removed some of the callus around there was 1 area when removing the callus that was bleeding I used a  this this visit. 05-18-2023 upon evaluation today patient's wound actually showing signs of improvement. Fortunately I do not see any signs of active infection locally or systemically which is great news. No fevers, chills, nausea, vomiting, or diarrhea. 05-24-2023 upon evaluation today patient's wound on the heel appears to be doing quite well. Fortunately there does not appear to be any signs of active infection at this time which is great news. Fortunately I think that he is making excellent headway with the current treatment regimen. 06-01-2023 upon evaluation today patient appears to be doing excellent in regard to his wound on the heel this is actually showing signs of being smaller measuring better and looking like is doing quite well. Fortunately I do not see any signs of infection at this time. He does have a small wound on the leg which we can also need to address this looks like it was just a small blister that ruptured I think it should heal pretty quickly. 06-07-2023 upon evaluation today patient appears to be doing well currently in regard to his wound. He has been tolerating the dressing changes without complication. Fortunately there does not appear to be any signs of active infection locally or  systemically at this time. No fevers, chills, nausea, vomiting, or diarrhea. [name], [name] ([id number]) [id number] Page 6 of 14 8/26; wound near the tip of the left heel. He is a diabetic. We have been using Hydrofera Blue under Urgo K2 lite wrap he has been in his surgical shoe although he tells me it disintegrated and he came in on a regular running shoe today. 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 He is not wearing compression stocking 06-22-2023 upon evaluation today patient appears to be doing excellent in regard to his wound on the heel which is actually significantly smaller compared to last week's evaluation. Fortunately I do not see any signs of active infection locally or systemically at this time. 06-28-2023 upon evaluation today patient appears to be doing well currently in regard to his heel which is actually showing signs of improvement. Fortunately I do not see any evidence of active infection at this time which is great news and in general I do believe that we are making headway towards complete closure which is good news. No fevers, chills, nausea, vomiting, or diarrhea. Electronic Signature(s) Signed: 06/28/2023 3:02:07 PM By: [name] Derry PA-C Previous Signature: 06/28/2023 2:51:31 PM Version By: [name] Derry PA-C Entered By: [name] Derry on 06/28/2023 15:02:07 -------------------------------------------------------------------------------- Physical Exam Details Patient Name: Date of Service: [name], [name] [name] 06/28/2023 1:45 PM Medical Record Number: [id number] Patient Account Number: [id number] Date of Birth/Sex: Treating RN: 1945/11/03 (76 y.o. [name] Primary Care Provider: [name] Other Clinician: Betha Loa Referring Provider: Treating Provider/Extender: Hermine Messick Weeks in Treatment: 82 Constitutional Well-nourished and well-hydrated in no acute distress. Respiratory normal  breathing without difficulty. Psychiatric this patient is able to make decisions and demonstrates good insight into disease process. Alert and Oriented x 3. pleasant and cooperative. Notes Upon inspection patient's wound showed signs of good granulation epithelization at this point. Fortunately I do not see any signs of worsening overall and I do believe that the patient is making headway towards closure. I do think that he is going require some debridement clearway slough and biofilm down to good subcutaneous tissue also removed some of the callus around there was 1 area when removing the callus that was bleeding I used a  SABIEN, MAKAREWICZ ([id number]) [id number] Page 1 of 14 Visit Report for 06/28/2023 Chief Complaint Document Details Patient Name: Date of Service: [name], [name] [facility] J. 06/28/2023 1:45 PM Medical Record Number: [id number] Patient Account Number: [id number] Date of Birth/Sex: Treating RN: 1946/09/04 (77 y.o. [name]) [name] Primary Care Provider: [name] Other Clinician: Betha Loa Referring Provider: Treating Provider/Extender: [name] in Treatment: 35 Information Obtained from: Patient Chief Complaint Left heel ulcer Electronic Signature(s) Signed: 06/28/2023 1:45:26 PM By: [name] Derry PA-C Entered By: [name] Derry on 06/28/2023 13:45:26 -------------------------------------------------------------------------------- Debridement Details Patient Name: Date of Service: [name], [name] [name] 06/28/2023 1:45 PM Medical Record Number: [id number] Patient Account Number: [id number] Date of Birth/Sex: Treating RN: 03/19/1946 (77 y.o. [name]) [name] Primary Care Provider: [name] Other Clinician: Betha Loa Referring Provider: Treating Provider/Extender: [name] in Treatment: 53 Debridement Performed for Assessment: Wound #10 Left Calcaneus Performed By: Physician [name] Derry, PA-C Debridement Type: Debridement Severity of Tissue Pre Debridement: Fat layer exposed Level of Consciousness (Pre-procedure): Awake and Alert Pre-procedure Verification/Time Out Yes - 14:55 Taken: Start Time: 14:55 Percent of Wound Bed Debrided: 100% T Area Debrided (cm): otal 0.09 Tissue and other material debrided: Viable, Non-Viable, Callus, Slough, Slough Level: Non-Viable Tissue Debridement Description: Selective/Open Wound Instrument: Curette Bleeding: Moderate Hemostasis Achieved: Silver Nitrate Response to Treatment: Procedure was tolerated well Level of Consciousness (Post- Awake and  Alert procedure): ALISA, APPLEYARD ([id number]) [phone].pdf Page 2 of 14 Post Debridement Measurements of Total Wound Length: (cm) 0.3 Width: (cm) 0.4 Depth: (cm) 0.1 Volume: (cm) 0.009 Character of Wound/Ulcer Post Debridement: Stable Severity of Tissue Post Debridement: Fat layer exposed Post Procedure Diagnosis Same as Pre-procedure Notes one silver nitrate stick used to stop bleeding Electronic Signature(s) Signed: 06/29/2023 4:31:19 PM By: Betha Loa Signed: 07/01/2023 4:31:22 PM By: [name] Derry PA-C Signed: 07/02/2023 1:15:19 PM By: [name] RN Entered By: Betha Loa on 06/28/2023 14:57:15 -------------------------------------------------------------------------------- HPI Details Patient Name: Date of Service: [name], [name] [name] 06/28/2023 1:45 PM Medical Record Number: [id number] Patient Account Number: [id number] Date of Birth/Sex: Treating RN: 1946-09-06 (77 y.o. 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 He has been seeing podiatry and they have been attempting to manage this including what sounds to be a total contact cast, Unna boot, and just standard dressings otherwise as well. Most recently has been using triple antibiotic ointment. With that being said unfortunately despite everything he really has not had any significant improvement. He tells me that he cannot even really remember exactly how this began but he presumed it may have rubbed on his shoes or something of that nature. 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 He tells me that this does seem to help. Fortunately there is no signs of active infection at this time. His most recent hemoglobin A1c was 8.1 though he knows this was this year he cannot tell me the exact time. His

## 2023-06-30 DIAGNOSIS — M1711 Unilateral primary osteoarthritis, right knee: Secondary | ICD-10-CM | POA: Diagnosis not present

## 2023-06-30 DIAGNOSIS — S62522B Displaced fracture of distal phalanx of left thumb, initial encounter for open fracture: Secondary | ICD-10-CM | POA: Diagnosis not present

## 2023-07-02 DIAGNOSIS — Z23 Encounter for immunization: Secondary | ICD-10-CM | POA: Diagnosis not present

## 2023-07-02 DIAGNOSIS — I480 Paroxysmal atrial fibrillation: Secondary | ICD-10-CM | POA: Diagnosis not present

## 2023-07-05 ENCOUNTER — Encounter: Payer: Medicare HMO | Admitting: Physician Assistant

## 2023-07-05 DIAGNOSIS — E11621 Type 2 diabetes mellitus with foot ulcer: Secondary | ICD-10-CM | POA: Diagnosis not present

## 2023-07-05 DIAGNOSIS — L97522 Non-pressure chronic ulcer of other part of left foot with fat layer exposed: Secondary | ICD-10-CM | POA: Diagnosis not present

## 2023-07-05 DIAGNOSIS — Z7901 Long term (current) use of anticoagulants: Secondary | ICD-10-CM | POA: Diagnosis not present

## 2023-07-05 DIAGNOSIS — I10 Essential (primary) hypertension: Secondary | ICD-10-CM | POA: Diagnosis not present

## 2023-07-05 DIAGNOSIS — Z952 Presence of prosthetic heart valve: Secondary | ICD-10-CM | POA: Diagnosis not present

## 2023-07-05 DIAGNOSIS — G8929 Other chronic pain: Secondary | ICD-10-CM | POA: Diagnosis not present

## 2023-07-05 DIAGNOSIS — E1151 Type 2 diabetes mellitus with diabetic peripheral angiopathy without gangrene: Secondary | ICD-10-CM | POA: Diagnosis not present

## 2023-07-05 DIAGNOSIS — L97422 Non-pressure chronic ulcer of left heel and midfoot with fat layer exposed: Secondary | ICD-10-CM | POA: Diagnosis not present

## 2023-07-05 DIAGNOSIS — E1142 Type 2 diabetes mellitus with diabetic polyneuropathy: Secondary | ICD-10-CM | POA: Diagnosis not present

## 2023-07-05 NOTE — Progress Notes (Signed)
[name], [name] ([id number]) [id number] Page 1 of 14 Visit Report for 07/05/2023 Chief Complaint Document Details Patient Name: Date of Service: [name], [name]. 07/05/2023 1:45 PM Medical Record Number: [id number] Patient Account Number: [id number] Date of Birth/Sex: Treating RN: Apr 30, 1946 (77 y.o. [name]) [name] Primary Care Provider: [name] Other Clinician: Betha Loa Referring Provider: Treating Provider/Extender: [name] in Treatment: 50 Information Obtained from: Patient Chief Complaint Left heel ulcer Electronic Signature(s) Signed: 07/05/2023 1:49:01 PM By: [name] Derry PA-C Entered By: [name] Derry on 07/05/2023 13:49:01 -------------------------------------------------------------------------------- Debridement Details Patient Name: Date of Service: [name], GEO [name]. 07/05/2023 1:45 PM Medical Record Number: [id number] Patient Account Number: [id number] Date of Birth/Sex: Treating RN: 03-08-1946 (77 y.o. [name]) [name] Primary Care Provider: [name] Other Clinician: Betha Loa Referring Provider: Treating Provider/Extender: [name] in Treatment: 54 Debridement Performed for Assessment: Wound #10 Left Calcaneus Performed By: Physician [name] Derry, PA-C Debridement Type: Debridement Severity of Tissue Pre Debridement: Fat layer exposed Level of Consciousness (Pre-procedure): Awake and Alert Pre-procedure Verification/Time Out Yes - 14:26 Taken: Start Time: 14:26 Percent of Wound Bed Debrided: 100% T Area Debrided (cm): otal 0.09 Tissue and other material debrided: Viable, Non-Viable, Callus Level: Non-Viable Tissue Debridement Description: Selective/Open Wound Instrument: Curette Bleeding: Minimum Hemostasis Achieved: Pressure Response to Treatment: Procedure was tolerated well Level of Consciousness (Post- Awake and Alert procedure): CONROY, SEITZ ([id number]) [id number] Page 2 of 14 Post Debridement Measurements of Total Wound Length: (cm) 0.3 Width: (cm) 0.4 Depth: (cm) 0.1 Volume: (cm) 0.009 Character of Wound/Ulcer Post Debridement: Improved Severity of Tissue Post Debridement: Fat layer exposed Post Procedure Diagnosis Same as Pre-procedure Electronic Signature(s) Signed: 07/05/2023 5:11:44 PM By: [name] Derry PA-C Signed: 07/05/2023 5:21:21 PM By: Betha Loa Signed: 07/08/2023 1:18:15 PM By: [name] RN Entered By: Betha Loa on 07/05/2023 14:30:05 -------------------------------------------------------------------------------- HPI Details Patient Name: Date of Service: [name], GEO [name]. 07/05/2023 1:45 PM Medical Record Number: [id number] Patient Account Number: [id number] Date of Birth/Sex: Treating RN: 1945/11/09 (77 y.o. [name] Primary Care Provider: [name] Other Clinician: Betha Loa Referring Provider: Treating Provider/Extender: [name] in Treatment: 54 History of Present Illness HPI Description: 09/24/2020 on evaluation today patient presents today for a heel ulcer that he tells me has been present for about 2 years. He has been seeing podiatry and they have been attempting to manage this including what sounds to be a total contact cast, Unna boot, and just standard dressings otherwise as well. Most recently has been using triple antibiotic ointment. With that being said unfortunately despite everything he really has not had any significant improvement. He tells me that he cannot even really remember exactly how this began but he presumed it may have rubbed on his shoes or something of that nature. 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 He also does have chronic  pain in the way of neuropathy which he takes medications for including Cymbalta and methadone. He tells me that this does seem to help. Fortunately there is no signs of active infection at this time. His most recent hemoglobin A1c was 8.1 though he knows this was this year he cannot tell me the exact time. His fluid pills currently to help with some of the lower extremity edema  Number: [id number] Patient Account Number: [id number] Date of Birth/Sex: Treating RN: 06/16/1946 (77 y.o. 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 Facility Procedures : CPT4 Code: 41660630 Description: 97597 - DEBRIDE WOUND 1ST 20 SQ CM OR < ICD-10  Diagnosis Description 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sed Modifier: Quantity: 1 Physician Procedures : CPT4 Code Description Modifier [id number] 97597 - WC PHYS DEBR WO ANESTH 20 SQ CM ICD-10 Diagnosis Description 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sed Quantity: 1 Electronic Signature(s) Signed: 07/05/2023 2:41:32 PM By: [name] Derry PA-C Entered By: [name] Derry on 07/05/2023 14:41:31 [name] ([id number]) [id number] Page 14 of 14  [name] on 07/05/2023 14:40:25 -------------------------------------------------------------------------------- Physician Orders Details Patient Name: Date of Service: [name]. 07/05/2023 1:45 PM Medical Record Number: [id number] Patient Account Number: [id number] Date of Birth/Sex: Treating RN: March 24, 1946 (77 y.o. 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 Bathing/ Shower/ Hygiene May shower; gently cleanse wound with antibacterial soap, rinse and pat dry prior to dressing wounds Off-Loading Open toe surgical shoe Wound Treatment Wound #10 - Calcaneus Wound Laterality: Left Topical:  Mupirocin Ointment 1 x Per Week/30 Days Discharge Instructions: Apply as directed by provider. Prim Dressing: Hydrofera Blue Ready Transfer Foam, 2.5x2.5 (in/in) (Dispense As Written) 1 x Per Week/30 Days ary Discharge Instructions: Apply Hydrofera Blue Ready to wound bed as directed Secondary Dressing: ABD Pad 5x9 (in/in) 1 x Per Week/30 Days Discharge Instructions: Cover with ABD pad Compression Wrap: Urgo K2 Lite, two layer compression system, regular 1 x Per Week/30 Days Electronic Signature(s) Signed: 07/05/2023 5:11:44 PM By: [name] Derry PA-C Signed: 07/05/2023 5:21:21 PM By: Betha Loa Entered By: Betha Loa on 07/05/2023 14:31:09 -------------------------------------------------------------------------------- Problem List Details Patient Name: Date of Service: [name], GEO [name]. 07/05/2023 1:45 PM Medical Record Number: [id number] Patient Account Number: [id number] Date of Birth/Sex: Treating RN: 01-13-1946 (76 y.o. [name] Primary Care Provider: [name] Other Clinician: Betha Loa Referring Provider: Treating Provider/Extender: [name] in Treatment: 76 Active Problems ICD-10 Encounter Code Description Active Date MDM Diagnosis E11.621 Type 2 diabetes mellitus with foot ulcer 06/19/2022 No Yes 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sed 06/19/2022 No Yes [name], [name] ([id number]) [phone].pdf Page 8 of 14 E11.42 Type 2 diabetes mellitus with diabetic polyneuropathy 06/19/2022 No Yes Inactive Problems Resolved Problems Electronic Signature(s) Signed: 07/05/2023 1:48:45 PM By: [name] Derry PA-C Entered By: [name] Derry on 07/05/2023 13:48:45 -------------------------------------------------------------------------------- Progress Note Details Patient Name: Date of Service: [name], GEO [name]. 07/05/2023 1:45 PM Medical Record Number: [id number] Patient  Account Number: [id number] Date of Birth/Sex: Treating RN: 1946/04/18 (76 y.o. [name] Primary Care Provider: [name] Other Clinician: Betha Loa Referring Provider: Treating Provider/Extender: [name] in Treatment: 56 Subjective Chief Complaint Information obtained from Patient Left heel ulcer History of Present Illness (HPI) 09/24/2020 on evaluation today patient presents today for a heel ulcer that he tells me has been present for about 2 years. He has been seeing podiatry and they have been attempting to manage this including what sounds to be a total contact cast, Unna boot, and just standard dressings otherwise as well. Most recently has been using triple antibiotic ointment. With that being said unfortunately despite everything he really has not had any significant improvement. He tells me that he cannot even really remember exactly how this began but he presumed it may have rubbed on his shoes or something of that nature. With that being said he tells me that the other issues that he has majorly is the presence of a artificial heart valve  [name] on 07/05/2023 14:40:25 -------------------------------------------------------------------------------- Physician Orders Details Patient Name: Date of Service: [name]. 07/05/2023 1:45 PM Medical Record Number: [id number] Patient Account Number: [id number] Date of Birth/Sex: Treating RN: March 24, 1946 (77 y.o. 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 Bathing/ Shower/ Hygiene May shower; gently cleanse wound with antibacterial soap, rinse and pat dry prior to dressing wounds Off-Loading Open toe surgical shoe Wound Treatment Wound #10 - Calcaneus Wound Laterality: Left Topical:  Mupirocin Ointment 1 x Per Week/30 Days Discharge Instructions: Apply as directed by provider. Prim Dressing: Hydrofera Blue Ready Transfer Foam, 2.5x2.5 (in/in) (Dispense As Written) 1 x Per Week/30 Days ary Discharge Instructions: Apply Hydrofera Blue Ready to wound bed as directed Secondary Dressing: ABD Pad 5x9 (in/in) 1 x Per Week/30 Days Discharge Instructions: Cover with ABD pad Compression Wrap: Urgo K2 Lite, two layer compression system, regular 1 x Per Week/30 Days Electronic Signature(s) Signed: 07/05/2023 5:11:44 PM By: [name] Derry PA-C Signed: 07/05/2023 5:21:21 PM By: Betha Loa Entered By: Betha Loa on 07/05/2023 14:31:09 -------------------------------------------------------------------------------- Problem List Details Patient Name: Date of Service: [name], GEO [name]. 07/05/2023 1:45 PM Medical Record Number: [id number] Patient Account Number: [id number] Date of Birth/Sex: Treating RN: 01-13-1946 (76 y.o. [name] Primary Care Provider: [name] Other Clinician: Betha Loa Referring Provider: Treating Provider/Extender: [name] in Treatment: 76 Active Problems ICD-10 Encounter Code Description Active Date MDM Diagnosis E11.621 Type 2 diabetes mellitus with foot ulcer 06/19/2022 No Yes 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sed 06/19/2022 No Yes [name], [name] ([id number]) [phone].pdf Page 8 of 14 E11.42 Type 2 diabetes mellitus with diabetic polyneuropathy 06/19/2022 No Yes Inactive Problems Resolved Problems Electronic Signature(s) Signed: 07/05/2023 1:48:45 PM By: [name] Derry PA-C Entered By: [name] Derry on 07/05/2023 13:48:45 -------------------------------------------------------------------------------- Progress Note Details Patient Name: Date of Service: [name], GEO [name]. 07/05/2023 1:45 PM Medical Record Number: [id number] Patient  Account Number: [id number] Date of Birth/Sex: Treating RN: 1946/04/18 (76 y.o. [name] Primary Care Provider: [name] Other Clinician: Betha Loa Referring Provider: Treating Provider/Extender: [name] in Treatment: 56 Subjective Chief Complaint Information obtained from Patient Left heel ulcer History of Present Illness (HPI) 09/24/2020 on evaluation today patient presents today for a heel ulcer that he tells me has been present for about 2 years. He has been seeing podiatry and they have been attempting to manage this including what sounds to be a total contact cast, Unna boot, and just standard dressings otherwise as well. Most recently has been using triple antibiotic ointment. With that being said unfortunately despite everything he really has not had any significant improvement. He tells me that he cannot even really remember exactly how this began but he presumed it may have rubbed on his shoes or something of that nature. With that being said he tells me that the other issues that he has majorly is the presence of a artificial heart valve  Number: [id number] Patient Account Number: [id number] Date of Birth/Sex: Treating RN: 06/16/1946 (77 y.o. 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 Facility Procedures : CPT4 Code: 41660630 Description: 97597 - DEBRIDE WOUND 1ST 20 SQ CM OR < ICD-10  Diagnosis Description 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sed Modifier: Quantity: 1 Physician Procedures : CPT4 Code Description Modifier [id number] 97597 - WC PHYS DEBR WO ANESTH 20 SQ CM ICD-10 Diagnosis Description 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sed Quantity: 1 Electronic Signature(s) Signed: 07/05/2023 2:41:32 PM By: [name] Derry PA-C Entered By: [name] Derry on 07/05/2023 14:41:31 [name] ([id number]) [id number] Page 14 of 14  Number: [id number] Patient Account Number: [id number] Date of Birth/Sex: Treating RN: 06/16/1946 (77 y.o. 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 Facility Procedures : CPT4 Code: 41660630 Description: 97597 - DEBRIDE WOUND 1ST 20 SQ CM OR < ICD-10  Diagnosis Description 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sed Modifier: Quantity: 1 Physician Procedures : CPT4 Code Description Modifier [id number] 97597 - WC PHYS DEBR WO ANESTH 20 SQ CM ICD-10 Diagnosis Description 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sed Quantity: 1 Electronic Signature(s) Signed: 07/05/2023 2:41:32 PM By: [name] Derry PA-C Entered By: [name] Derry on 07/05/2023 14:41:31 [name] ([id number]) [id number] Page 14 of 14  [name], [name] ([id number]) [id number] Page 1 of 14 Visit Report for 07/05/2023 Chief Complaint Document Details Patient Name: Date of Service: [name], [name]. 07/05/2023 1:45 PM Medical Record Number: [id number] Patient Account Number: [id number] Date of Birth/Sex: Treating RN: Apr 30, 1946 (77 y.o. [name]) [name] Primary Care Provider: [name] Other Clinician: Betha Loa Referring Provider: Treating Provider/Extender: [name] in Treatment: 50 Information Obtained from: Patient Chief Complaint Left heel ulcer Electronic Signature(s) Signed: 07/05/2023 1:49:01 PM By: [name] Derry PA-C Entered By: [name] Derry on 07/05/2023 13:49:01 -------------------------------------------------------------------------------- Debridement Details Patient Name: Date of Service: [name], GEO [name]. 07/05/2023 1:45 PM Medical Record Number: [id number] Patient Account Number: [id number] Date of Birth/Sex: Treating RN: 03-08-1946 (77 y.o. [name]) [name] Primary Care Provider: [name] Other Clinician: Betha Loa Referring Provider: Treating Provider/Extender: [name] in Treatment: 54 Debridement Performed for Assessment: Wound #10 Left Calcaneus Performed By: Physician [name] Derry, PA-C Debridement Type: Debridement Severity of Tissue Pre Debridement: Fat layer exposed Level of Consciousness (Pre-procedure): Awake and Alert Pre-procedure Verification/Time Out Yes - 14:26 Taken: Start Time: 14:26 Percent of Wound Bed Debrided: 100% T Area Debrided (cm): otal 0.09 Tissue and other material debrided: Viable, Non-Viable, Callus Level: Non-Viable Tissue Debridement Description: Selective/Open Wound Instrument: Curette Bleeding: Minimum Hemostasis Achieved: Pressure Response to Treatment: Procedure was tolerated well Level of Consciousness (Post- Awake and Alert procedure): CONROY, SEITZ ([id number]) [id number] Page 2 of 14 Post Debridement Measurements of Total Wound Length: (cm) 0.3 Width: (cm) 0.4 Depth: (cm) 0.1 Volume: (cm) 0.009 Character of Wound/Ulcer Post Debridement: Improved Severity of Tissue Post Debridement: Fat layer exposed Post Procedure Diagnosis Same as Pre-procedure Electronic Signature(s) Signed: 07/05/2023 5:11:44 PM By: [name] Derry PA-C Signed: 07/05/2023 5:21:21 PM By: Betha Loa Signed: 07/08/2023 1:18:15 PM By: [name] RN Entered By: Betha Loa on 07/05/2023 14:30:05 -------------------------------------------------------------------------------- HPI Details Patient Name: Date of Service: [name], GEO [name]. 07/05/2023 1:45 PM Medical Record Number: [id number] Patient Account Number: [id number] Date of Birth/Sex: Treating RN: 1945/11/09 (77 y.o. [name] Primary Care Provider: [name] Other Clinician: Betha Loa Referring Provider: Treating Provider/Extender: [name] in Treatment: 54 History of Present Illness HPI Description: 09/24/2020 on evaluation today patient presents today for a heel ulcer that he tells me has been present for about 2 years. He has been seeing podiatry and they have been attempting to manage this including what sounds to be a total contact cast, Unna boot, and just standard dressings otherwise as well. Most recently has been using triple antibiotic ointment. With that being said unfortunately despite everything he really has not had any significant improvement. He tells me that he cannot even really remember exactly how this began but he presumed it may have rubbed on his shoes or something of that nature. 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 He also does have chronic  pain in the way of neuropathy which he takes medications for including Cymbalta and methadone. He tells me that this does seem to help. Fortunately there is no signs of active infection at this time. His most recent hemoglobin A1c was 8.1 though he knows this was this year he cannot tell me the exact time. His fluid pills currently to help with some of the lower extremity edema  [name] on 07/05/2023 14:40:25 -------------------------------------------------------------------------------- Physician Orders Details Patient Name: Date of Service: [name]. 07/05/2023 1:45 PM Medical Record Number: [id number] Patient Account Number: [id number] Date of Birth/Sex: Treating RN: March 24, 1946 (77 y.o. 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 Bathing/ Shower/ Hygiene May shower; gently cleanse wound with antibacterial soap, rinse and pat dry prior to dressing wounds Off-Loading Open toe surgical shoe Wound Treatment Wound #10 - Calcaneus Wound Laterality: Left Topical:  Mupirocin Ointment 1 x Per Week/30 Days Discharge Instructions: Apply as directed by provider. Prim Dressing: Hydrofera Blue Ready Transfer Foam, 2.5x2.5 (in/in) (Dispense As Written) 1 x Per Week/30 Days ary Discharge Instructions: Apply Hydrofera Blue Ready to wound bed as directed Secondary Dressing: ABD Pad 5x9 (in/in) 1 x Per Week/30 Days Discharge Instructions: Cover with ABD pad Compression Wrap: Urgo K2 Lite, two layer compression system, regular 1 x Per Week/30 Days Electronic Signature(s) Signed: 07/05/2023 5:11:44 PM By: [name] Derry PA-C Signed: 07/05/2023 5:21:21 PM By: Betha Loa Entered By: Betha Loa on 07/05/2023 14:31:09 -------------------------------------------------------------------------------- Problem List Details Patient Name: Date of Service: [name], GEO [name]. 07/05/2023 1:45 PM Medical Record Number: [id number] Patient Account Number: [id number] Date of Birth/Sex: Treating RN: 01-13-1946 (76 y.o. [name] Primary Care Provider: [name] Other Clinician: Betha Loa Referring Provider: Treating Provider/Extender: [name] in Treatment: 76 Active Problems ICD-10 Encounter Code Description Active Date MDM Diagnosis E11.621 Type 2 diabetes mellitus with foot ulcer 06/19/2022 No Yes 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sed 06/19/2022 No Yes [name], [name] ([id number]) [phone].pdf Page 8 of 14 E11.42 Type 2 diabetes mellitus with diabetic polyneuropathy 06/19/2022 No Yes Inactive Problems Resolved Problems Electronic Signature(s) Signed: 07/05/2023 1:48:45 PM By: [name] Derry PA-C Entered By: [name] Derry on 07/05/2023 13:48:45 -------------------------------------------------------------------------------- Progress Note Details Patient Name: Date of Service: [name], GEO [name]. 07/05/2023 1:45 PM Medical Record Number: [id number] Patient  Account Number: [id number] Date of Birth/Sex: Treating RN: 1946/04/18 (76 y.o. [name] Primary Care Provider: [name] Other Clinician: Betha Loa Referring Provider: Treating Provider/Extender: [name] in Treatment: 56 Subjective Chief Complaint Information obtained from Patient Left heel ulcer History of Present Illness (HPI) 09/24/2020 on evaluation today patient presents today for a heel ulcer that he tells me has been present for about 2 years. He has been seeing podiatry and they have been attempting to manage this including what sounds to be a total contact cast, Unna boot, and just standard dressings otherwise as well. Most recently has been using triple antibiotic ointment. With that being said unfortunately despite everything he really has not had any significant improvement. He tells me that he cannot even really remember exactly how this began but he presumed it may have rubbed on his shoes or something of that nature. With that being said he tells me that the other issues that he has majorly is the presence of a artificial heart valve  [name] on 07/05/2023 14:40:25 -------------------------------------------------------------------------------- Physician Orders Details Patient Name: Date of Service: [name]. 07/05/2023 1:45 PM Medical Record Number: [id number] Patient Account Number: [id number] Date of Birth/Sex: Treating RN: March 24, 1946 (77 y.o. 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 Bathing/ Shower/ Hygiene May shower; gently cleanse wound with antibacterial soap, rinse and pat dry prior to dressing wounds Off-Loading Open toe surgical shoe Wound Treatment Wound #10 - Calcaneus Wound Laterality: Left Topical:  Mupirocin Ointment 1 x Per Week/30 Days Discharge Instructions: Apply as directed by provider. Prim Dressing: Hydrofera Blue Ready Transfer Foam, 2.5x2.5 (in/in) (Dispense As Written) 1 x Per Week/30 Days ary Discharge Instructions: Apply Hydrofera Blue Ready to wound bed as directed Secondary Dressing: ABD Pad 5x9 (in/in) 1 x Per Week/30 Days Discharge Instructions: Cover with ABD pad Compression Wrap: Urgo K2 Lite, two layer compression system, regular 1 x Per Week/30 Days Electronic Signature(s) Signed: 07/05/2023 5:11:44 PM By: [name] Derry PA-C Signed: 07/05/2023 5:21:21 PM By: Betha Loa Entered By: Betha Loa on 07/05/2023 14:31:09 -------------------------------------------------------------------------------- Problem List Details Patient Name: Date of Service: [name], GEO [name]. 07/05/2023 1:45 PM Medical Record Number: [id number] Patient Account Number: [id number] Date of Birth/Sex: Treating RN: 01-13-1946 (76 y.o. [name] Primary Care Provider: [name] Other Clinician: Betha Loa Referring Provider: Treating Provider/Extender: [name] in Treatment: 76 Active Problems ICD-10 Encounter Code Description Active Date MDM Diagnosis E11.621 Type 2 diabetes mellitus with foot ulcer 06/19/2022 No Yes 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sed 06/19/2022 No Yes [name], [name] ([id number]) [phone].pdf Page 8 of 14 E11.42 Type 2 diabetes mellitus with diabetic polyneuropathy 06/19/2022 No Yes Inactive Problems Resolved Problems Electronic Signature(s) Signed: 07/05/2023 1:48:45 PM By: [name] Derry PA-C Entered By: [name] Derry on 07/05/2023 13:48:45 -------------------------------------------------------------------------------- Progress Note Details Patient Name: Date of Service: [name], GEO [name]. 07/05/2023 1:45 PM Medical Record Number: [id number] Patient  Account Number: [id number] Date of Birth/Sex: Treating RN: 1946/04/18 (76 y.o. [name] Primary Care Provider: [name] Other Clinician: Betha Loa Referring Provider: Treating Provider/Extender: [name] in Treatment: 56 Subjective Chief Complaint Information obtained from Patient Left heel ulcer History of Present Illness (HPI) 09/24/2020 on evaluation today patient presents today for a heel ulcer that he tells me has been present for about 2 years. He has been seeing podiatry and they have been attempting to manage this including what sounds to be a total contact cast, Unna boot, and just standard dressings otherwise as well. Most recently has been using triple antibiotic ointment. With that being said unfortunately despite everything he really has not had any significant improvement. He tells me that he cannot even really remember exactly how this began but he presumed it may have rubbed on his shoes or something of that nature. With that being said he tells me that the other issues that he has majorly is the presence of a artificial heart valve  [name], [name] ([id number]) [id number] Page 1 of 14 Visit Report for 07/05/2023 Chief Complaint Document Details Patient Name: Date of Service: [name], [name]. 07/05/2023 1:45 PM Medical Record Number: [id number] Patient Account Number: [id number] Date of Birth/Sex: Treating RN: Apr 30, 1946 (77 y.o. [name]) [name] Primary Care Provider: [name] Other Clinician: Betha Loa Referring Provider: Treating Provider/Extender: [name] in Treatment: 50 Information Obtained from: Patient Chief Complaint Left heel ulcer Electronic Signature(s) Signed: 07/05/2023 1:49:01 PM By: [name] Derry PA-C Entered By: [name] Derry on 07/05/2023 13:49:01 -------------------------------------------------------------------------------- Debridement Details Patient Name: Date of Service: [name], GEO [name]. 07/05/2023 1:45 PM Medical Record Number: [id number] Patient Account Number: [id number] Date of Birth/Sex: Treating RN: 03-08-1946 (77 y.o. [name]) [name] Primary Care Provider: [name] Other Clinician: Betha Loa Referring Provider: Treating Provider/Extender: [name] in Treatment: 54 Debridement Performed for Assessment: Wound #10 Left Calcaneus Performed By: Physician [name] Derry, PA-C Debridement Type: Debridement Severity of Tissue Pre Debridement: Fat layer exposed Level of Consciousness (Pre-procedure): Awake and Alert Pre-procedure Verification/Time Out Yes - 14:26 Taken: Start Time: 14:26 Percent of Wound Bed Debrided: 100% T Area Debrided (cm): otal 0.09 Tissue and other material debrided: Viable, Non-Viable, Callus Level: Non-Viable Tissue Debridement Description: Selective/Open Wound Instrument: Curette Bleeding: Minimum Hemostasis Achieved: Pressure Response to Treatment: Procedure was tolerated well Level of Consciousness (Post- Awake and Alert procedure): CONROY, SEITZ ([id number]) [id number] Page 2 of 14 Post Debridement Measurements of Total Wound Length: (cm) 0.3 Width: (cm) 0.4 Depth: (cm) 0.1 Volume: (cm) 0.009 Character of Wound/Ulcer Post Debridement: Improved Severity of Tissue Post Debridement: Fat layer exposed Post Procedure Diagnosis Same as Pre-procedure Electronic Signature(s) Signed: 07/05/2023 5:11:44 PM By: [name] Derry PA-C Signed: 07/05/2023 5:21:21 PM By: Betha Loa Signed: 07/08/2023 1:18:15 PM By: [name] RN Entered By: Betha Loa on 07/05/2023 14:30:05 -------------------------------------------------------------------------------- HPI Details Patient Name: Date of Service: [name], GEO [name]. 07/05/2023 1:45 PM Medical Record Number: [id number] Patient Account Number: [id number] Date of Birth/Sex: Treating RN: 1945/11/09 (77 y.o. [name] Primary Care Provider: [name] Other Clinician: Betha Loa Referring Provider: Treating Provider/Extender: [name] in Treatment: 54 History of Present Illness HPI Description: 09/24/2020 on evaluation today patient presents today for a heel ulcer that he tells me has been present for about 2 years. He has been seeing podiatry and they have been attempting to manage this including what sounds to be a total contact cast, Unna boot, and just standard dressings otherwise as well. Most recently has been using triple antibiotic ointment. With that being said unfortunately despite everything he really has not had any significant improvement. He tells me that he cannot even really remember exactly how this began but he presumed it may have rubbed on his shoes or something of that nature. 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 He also does have chronic  pain in the way of neuropathy which he takes medications for including Cymbalta and methadone. He tells me that this does seem to help. Fortunately there is no signs of active infection at this time. His most recent hemoglobin A1c was 8.1 though he knows this was this year he cannot tell me the exact time. His fluid pills currently to help with some of the lower extremity edema  [name] on 07/05/2023 14:40:25 -------------------------------------------------------------------------------- Physician Orders Details Patient Name: Date of Service: [name]. 07/05/2023 1:45 PM Medical Record Number: [id number] Patient Account Number: [id number] Date of Birth/Sex: Treating RN: March 24, 1946 (77 y.o. 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 Bathing/ Shower/ Hygiene May shower; gently cleanse wound with antibacterial soap, rinse and pat dry prior to dressing wounds Off-Loading Open toe surgical shoe Wound Treatment Wound #10 - Calcaneus Wound Laterality: Left Topical:  Mupirocin Ointment 1 x Per Week/30 Days Discharge Instructions: Apply as directed by provider. Prim Dressing: Hydrofera Blue Ready Transfer Foam, 2.5x2.5 (in/in) (Dispense As Written) 1 x Per Week/30 Days ary Discharge Instructions: Apply Hydrofera Blue Ready to wound bed as directed Secondary Dressing: ABD Pad 5x9 (in/in) 1 x Per Week/30 Days Discharge Instructions: Cover with ABD pad Compression Wrap: Urgo K2 Lite, two layer compression system, regular 1 x Per Week/30 Days Electronic Signature(s) Signed: 07/05/2023 5:11:44 PM By: [name] Derry PA-C Signed: 07/05/2023 5:21:21 PM By: Betha Loa Entered By: Betha Loa on 07/05/2023 14:31:09 -------------------------------------------------------------------------------- Problem List Details Patient Name: Date of Service: [name], GEO [name]. 07/05/2023 1:45 PM Medical Record Number: [id number] Patient Account Number: [id number] Date of Birth/Sex: Treating RN: 01-13-1946 (76 y.o. [name] Primary Care Provider: [name] Other Clinician: Betha Loa Referring Provider: Treating Provider/Extender: [name] in Treatment: 76 Active Problems ICD-10 Encounter Code Description Active Date MDM Diagnosis E11.621 Type 2 diabetes mellitus with foot ulcer 06/19/2022 No Yes 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sed 06/19/2022 No Yes [name], [name] ([id number]) [phone].pdf Page 8 of 14 E11.42 Type 2 diabetes mellitus with diabetic polyneuropathy 06/19/2022 No Yes Inactive Problems Resolved Problems Electronic Signature(s) Signed: 07/05/2023 1:48:45 PM By: [name] Derry PA-C Entered By: [name] Derry on 07/05/2023 13:48:45 -------------------------------------------------------------------------------- Progress Note Details Patient Name: Date of Service: [name], GEO [name]. 07/05/2023 1:45 PM Medical Record Number: [id number] Patient  Account Number: [id number] Date of Birth/Sex: Treating RN: 1946/04/18 (76 y.o. [name] Primary Care Provider: [name] Other Clinician: Betha Loa Referring Provider: Treating Provider/Extender: [name] in Treatment: 56 Subjective Chief Complaint Information obtained from Patient Left heel ulcer History of Present Illness (HPI) 09/24/2020 on evaluation today patient presents today for a heel ulcer that he tells me has been present for about 2 years. He has been seeing podiatry and they have been attempting to manage this including what sounds to be a total contact cast, Unna boot, and just standard dressings otherwise as well. Most recently has been using triple antibiotic ointment. With that being said unfortunately despite everything he really has not had any significant improvement. He tells me that he cannot even really remember exactly how this began but he presumed it may have rubbed on his shoes or something of that nature. With that being said he tells me that the other issues that he has majorly is the presence of a artificial heart valve  [name], [name] ([id number]) [id number] Page 1 of 14 Visit Report for 07/05/2023 Chief Complaint Document Details Patient Name: Date of Service: [name], [name]. 07/05/2023 1:45 PM Medical Record Number: [id number] Patient Account Number: [id number] Date of Birth/Sex: Treating RN: Apr 30, 1946 (77 y.o. [name]) [name] Primary Care Provider: [name] Other Clinician: Betha Loa Referring Provider: Treating Provider/Extender: [name] in Treatment: 50 Information Obtained from: Patient Chief Complaint Left heel ulcer Electronic Signature(s) Signed: 07/05/2023 1:49:01 PM By: [name] Derry PA-C Entered By: [name] Derry on 07/05/2023 13:49:01 -------------------------------------------------------------------------------- Debridement Details Patient Name: Date of Service: [name], GEO [name]. 07/05/2023 1:45 PM Medical Record Number: [id number] Patient Account Number: [id number] Date of Birth/Sex: Treating RN: 03-08-1946 (77 y.o. [name]) [name] Primary Care Provider: [name] Other Clinician: Betha Loa Referring Provider: Treating Provider/Extender: [name] in Treatment: 54 Debridement Performed for Assessment: Wound #10 Left Calcaneus Performed By: Physician [name] Derry, PA-C Debridement Type: Debridement Severity of Tissue Pre Debridement: Fat layer exposed Level of Consciousness (Pre-procedure): Awake and Alert Pre-procedure Verification/Time Out Yes - 14:26 Taken: Start Time: 14:26 Percent of Wound Bed Debrided: 100% T Area Debrided (cm): otal 0.09 Tissue and other material debrided: Viable, Non-Viable, Callus Level: Non-Viable Tissue Debridement Description: Selective/Open Wound Instrument: Curette Bleeding: Minimum Hemostasis Achieved: Pressure Response to Treatment: Procedure was tolerated well Level of Consciousness (Post- Awake and Alert procedure): CONROY, SEITZ ([id number]) [id number] Page 2 of 14 Post Debridement Measurements of Total Wound Length: (cm) 0.3 Width: (cm) 0.4 Depth: (cm) 0.1 Volume: (cm) 0.009 Character of Wound/Ulcer Post Debridement: Improved Severity of Tissue Post Debridement: Fat layer exposed Post Procedure Diagnosis Same as Pre-procedure Electronic Signature(s) Signed: 07/05/2023 5:11:44 PM By: [name] Derry PA-C Signed: 07/05/2023 5:21:21 PM By: Betha Loa Signed: 07/08/2023 1:18:15 PM By: [name] RN Entered By: Betha Loa on 07/05/2023 14:30:05 -------------------------------------------------------------------------------- HPI Details Patient Name: Date of Service: [name], GEO [name]. 07/05/2023 1:45 PM Medical Record Number: [id number] Patient Account Number: [id number] Date of Birth/Sex: Treating RN: 1945/11/09 (77 y.o. [name] Primary Care Provider: [name] Other Clinician: Betha Loa Referring Provider: Treating Provider/Extender: [name] in Treatment: 54 History of Present Illness HPI Description: 09/24/2020 on evaluation today patient presents today for a heel ulcer that he tells me has been present for about 2 years. He has been seeing podiatry and they have been attempting to manage this including what sounds to be a total contact cast, Unna boot, and just standard dressings otherwise as well. Most recently has been using triple antibiotic ointment. With that being said unfortunately despite everything he really has not had any significant improvement. He tells me that he cannot even really remember exactly how this began but he presumed it may have rubbed on his shoes or something of that nature. 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 He also does have chronic  pain in the way of neuropathy which he takes medications for including Cymbalta and methadone. He tells me that this does seem to help. Fortunately there is no signs of active infection at this time. His most recent hemoglobin A1c was 8.1 though he knows this was this year he cannot tell me the exact time. His fluid pills currently to help with some of the lower extremity edema  [name] on 07/05/2023 14:40:25 -------------------------------------------------------------------------------- Physician Orders Details Patient Name: Date of Service: [name]. 07/05/2023 1:45 PM Medical Record Number: [id number] Patient Account Number: [id number] Date of Birth/Sex: Treating RN: March 24, 1946 (77 y.o. 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 Bathing/ Shower/ Hygiene May shower; gently cleanse wound with antibacterial soap, rinse and pat dry prior to dressing wounds Off-Loading Open toe surgical shoe Wound Treatment Wound #10 - Calcaneus Wound Laterality: Left Topical:  Mupirocin Ointment 1 x Per Week/30 Days Discharge Instructions: Apply as directed by provider. Prim Dressing: Hydrofera Blue Ready Transfer Foam, 2.5x2.5 (in/in) (Dispense As Written) 1 x Per Week/30 Days ary Discharge Instructions: Apply Hydrofera Blue Ready to wound bed as directed Secondary Dressing: ABD Pad 5x9 (in/in) 1 x Per Week/30 Days Discharge Instructions: Cover with ABD pad Compression Wrap: Urgo K2 Lite, two layer compression system, regular 1 x Per Week/30 Days Electronic Signature(s) Signed: 07/05/2023 5:11:44 PM By: [name] Derry PA-C Signed: 07/05/2023 5:21:21 PM By: Betha Loa Entered By: Betha Loa on 07/05/2023 14:31:09 -------------------------------------------------------------------------------- Problem List Details Patient Name: Date of Service: [name], GEO [name]. 07/05/2023 1:45 PM Medical Record Number: [id number] Patient Account Number: [id number] Date of Birth/Sex: Treating RN: 01-13-1946 (76 y.o. [name] Primary Care Provider: [name] Other Clinician: Betha Loa Referring Provider: Treating Provider/Extender: [name] in Treatment: 76 Active Problems ICD-10 Encounter Code Description Active Date MDM Diagnosis E11.621 Type 2 diabetes mellitus with foot ulcer 06/19/2022 No Yes 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sed 06/19/2022 No Yes [name], [name] ([id number]) [phone].pdf Page 8 of 14 E11.42 Type 2 diabetes mellitus with diabetic polyneuropathy 06/19/2022 No Yes Inactive Problems Resolved Problems Electronic Signature(s) Signed: 07/05/2023 1:48:45 PM By: [name] Derry PA-C Entered By: [name] Derry on 07/05/2023 13:48:45 -------------------------------------------------------------------------------- Progress Note Details Patient Name: Date of Service: [name], GEO [name]. 07/05/2023 1:45 PM Medical Record Number: [id number] Patient  Account Number: [id number] Date of Birth/Sex: Treating RN: 1946/04/18 (76 y.o. [name] Primary Care Provider: [name] Other Clinician: Betha Loa Referring Provider: Treating Provider/Extender: [name] in Treatment: 56 Subjective Chief Complaint Information obtained from Patient Left heel ulcer History of Present Illness (HPI) 09/24/2020 on evaluation today patient presents today for a heel ulcer that he tells me has been present for about 2 years. He has been seeing podiatry and they have been attempting to manage this including what sounds to be a total contact cast, Unna boot, and just standard dressings otherwise as well. Most recently has been using triple antibiotic ointment. With that being said unfortunately despite everything he really has not had any significant improvement. He tells me that he cannot even really remember exactly how this began but he presumed it may have rubbed on his shoes or something of that nature. With that being said he tells me that the other issues that he has majorly is the presence of a artificial heart valve  [name] on 07/05/2023 14:40:25 -------------------------------------------------------------------------------- Physician Orders Details Patient Name: Date of Service: [name]. 07/05/2023 1:45 PM Medical Record Number: [id number] Patient Account Number: [id number] Date of Birth/Sex: Treating RN: March 24, 1946 (77 y.o. 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 Bathing/ Shower/ Hygiene May shower; gently cleanse wound with antibacterial soap, rinse and pat dry prior to dressing wounds Off-Loading Open toe surgical shoe Wound Treatment Wound #10 - Calcaneus Wound Laterality: Left Topical:  Mupirocin Ointment 1 x Per Week/30 Days Discharge Instructions: Apply as directed by provider. Prim Dressing: Hydrofera Blue Ready Transfer Foam, 2.5x2.5 (in/in) (Dispense As Written) 1 x Per Week/30 Days ary Discharge Instructions: Apply Hydrofera Blue Ready to wound bed as directed Secondary Dressing: ABD Pad 5x9 (in/in) 1 x Per Week/30 Days Discharge Instructions: Cover with ABD pad Compression Wrap: Urgo K2 Lite, two layer compression system, regular 1 x Per Week/30 Days Electronic Signature(s) Signed: 07/05/2023 5:11:44 PM By: [name] Derry PA-C Signed: 07/05/2023 5:21:21 PM By: Betha Loa Entered By: Betha Loa on 07/05/2023 14:31:09 -------------------------------------------------------------------------------- Problem List Details Patient Name: Date of Service: [name], GEO [name]. 07/05/2023 1:45 PM Medical Record Number: [id number] Patient Account Number: [id number] Date of Birth/Sex: Treating RN: 01-13-1946 (76 y.o. [name] Primary Care Provider: [name] Other Clinician: Betha Loa Referring Provider: Treating Provider/Extender: [name] in Treatment: 76 Active Problems ICD-10 Encounter Code Description Active Date MDM Diagnosis E11.621 Type 2 diabetes mellitus with foot ulcer 06/19/2022 No Yes 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sed 06/19/2022 No Yes [name], [name] ([id number]) [phone].pdf Page 8 of 14 E11.42 Type 2 diabetes mellitus with diabetic polyneuropathy 06/19/2022 No Yes Inactive Problems Resolved Problems Electronic Signature(s) Signed: 07/05/2023 1:48:45 PM By: [name] Derry PA-C Entered By: [name] Derry on 07/05/2023 13:48:45 -------------------------------------------------------------------------------- Progress Note Details Patient Name: Date of Service: [name], GEO [name]. 07/05/2023 1:45 PM Medical Record Number: [id number] Patient  Account Number: [id number] Date of Birth/Sex: Treating RN: 1946/04/18 (76 y.o. [name] Primary Care Provider: [name] Other Clinician: Betha Loa Referring Provider: Treating Provider/Extender: [name] in Treatment: 56 Subjective Chief Complaint Information obtained from Patient Left heel ulcer History of Present Illness (HPI) 09/24/2020 on evaluation today patient presents today for a heel ulcer that he tells me has been present for about 2 years. He has been seeing podiatry and they have been attempting to manage this including what sounds to be a total contact cast, Unna boot, and just standard dressings otherwise as well. Most recently has been using triple antibiotic ointment. With that being said unfortunately despite everything he really has not had any significant improvement. He tells me that he cannot even really remember exactly how this began but he presumed it may have rubbed on his shoes or something of that nature. With that being said he tells me that the other issues that he has majorly is the presence of a artificial heart valve  Number: [id number] Patient Account Number: [id number] Date of Birth/Sex: Treating RN: 06/16/1946 (77 y.o. 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 Facility Procedures : CPT4 Code: 41660630 Description: 97597 - DEBRIDE WOUND 1ST 20 SQ CM OR < ICD-10  Diagnosis Description 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sed Modifier: Quantity: 1 Physician Procedures : CPT4 Code Description Modifier 1601093 97597 - WC PHYS DEBR WO ANESTH 20 SQ CM ICD-10 Diagnosis Description 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sed Quantity: 1 Electronic Signature(s) Signed: 07/05/2023 2:41:32 PM By: [name] Derry PA-C Entered By: [name] Derry on 07/05/2023 14:41:31 [name] ([id number]) [id number] Page 14 of 14  Number: [id number] Patient Account Number: [id number] Date of Birth/Sex: Treating RN: 06/16/1946 (77 y.o. 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 Facility Procedures : CPT4 Code: 41660630 Description: 97597 - DEBRIDE WOUND 1ST 20 SQ CM OR < ICD-10  Diagnosis Description 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sed Modifier: Quantity: 1 Physician Procedures : CPT4 Code Description Modifier 1601093 97597 - WC PHYS DEBR WO ANESTH 20 SQ CM ICD-10 Diagnosis Description 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sed Quantity: 1 Electronic Signature(s) Signed: 07/05/2023 2:41:32 PM By: [name] Derry PA-C Entered By: [name] Derry on 07/05/2023 14:41:31 [name] ([id number]) [id number] Page 14 of 14  [name] on 07/05/2023 14:40:25 -------------------------------------------------------------------------------- Physician Orders Details Patient Name: Date of Service: [name]. 07/05/2023 1:45 PM Medical Record Number: [id number] Patient Account Number: [id number] Date of Birth/Sex: Treating RN: March 24, 1946 (77 y.o. 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 Bathing/ Shower/ Hygiene May shower; gently cleanse wound with antibacterial soap, rinse and pat dry prior to dressing wounds Off-Loading Open toe surgical shoe Wound Treatment Wound #10 - Calcaneus Wound Laterality: Left Topical:  Mupirocin Ointment 1 x Per Week/30 Days Discharge Instructions: Apply as directed by provider. Prim Dressing: Hydrofera Blue Ready Transfer Foam, 2.5x2.5 (in/in) (Dispense As Written) 1 x Per Week/30 Days ary Discharge Instructions: Apply Hydrofera Blue Ready to wound bed as directed Secondary Dressing: ABD Pad 5x9 (in/in) 1 x Per Week/30 Days Discharge Instructions: Cover with ABD pad Compression Wrap: Urgo K2 Lite, two layer compression system, regular 1 x Per Week/30 Days Electronic Signature(s) Signed: 07/05/2023 5:11:44 PM By: [name] Derry PA-C Signed: 07/05/2023 5:21:21 PM By: Betha Loa Entered By: Betha Loa on 07/05/2023 14:31:09 -------------------------------------------------------------------------------- Problem List Details Patient Name: Date of Service: [name], GEO [name]. 07/05/2023 1:45 PM Medical Record Number: [id number] Patient Account Number: [id number] Date of Birth/Sex: Treating RN: 01-13-1946 (76 y.o. [name] Primary Care Provider: [name] Other Clinician: Betha Loa Referring Provider: Treating Provider/Extender: [name] in Treatment: 76 Active Problems ICD-10 Encounter Code Description Active Date MDM Diagnosis E11.621 Type 2 diabetes mellitus with foot ulcer 06/19/2022 No Yes 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sed 06/19/2022 No Yes [name], [name] ([id number]) [phone].pdf Page 8 of 14 E11.42 Type 2 diabetes mellitus with diabetic polyneuropathy 06/19/2022 No Yes Inactive Problems Resolved Problems Electronic Signature(s) Signed: 07/05/2023 1:48:45 PM By: [name] Derry PA-C Entered By: [name] Derry on 07/05/2023 13:48:45 -------------------------------------------------------------------------------- Progress Note Details Patient Name: Date of Service: [name], GEO [name]. 07/05/2023 1:45 PM Medical Record Number: [id number] Patient  Account Number: [id number] Date of Birth/Sex: Treating RN: 1946/04/18 (76 y.o. [name] Primary Care Provider: [name] Other Clinician: Betha Loa Referring Provider: Treating Provider/Extender: [name] in Treatment: 56 Subjective Chief Complaint Information obtained from Patient Left heel ulcer History of Present Illness (HPI) 09/24/2020 on evaluation today patient presents today for a heel ulcer that he tells me has been present for about 2 years. He has been seeing podiatry and they have been attempting to manage this including what sounds to be a total contact cast, Unna boot, and just standard dressings otherwise as well. Most recently has been using triple antibiotic ointment. With that being said unfortunately despite everything he really has not had any significant improvement. He tells me that he cannot even really remember exactly how this began but he presumed it may have rubbed on his shoes or something of that nature. With that being said he tells me that the other issues that he has majorly is the presence of a artificial heart valve  Number: [id number] Patient Account Number: [id number] Date of Birth/Sex: Treating RN: 06/16/1946 (77 y.o. 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 Facility Procedures : CPT4 Code: 41660630 Description: 97597 - DEBRIDE WOUND 1ST 20 SQ CM OR < ICD-10  Diagnosis Description 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sed Modifier: Quantity: 1 Physician Procedures : CPT4 Code Description Modifier [id number] 97597 - WC PHYS DEBR WO ANESTH 20 SQ CM ICD-10 Diagnosis Description 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sed Quantity: 1 Electronic Signature(s) Signed: 07/05/2023 2:41:32 PM By: [name] Derry PA-C Entered By: [name] Derry on 07/05/2023 14:41:31 [name] ([id number]) [id number] Page 14 of 14  [name] on 07/05/2023 14:40:25 -------------------------------------------------------------------------------- Physician Orders Details Patient Name: Date of Service: [name]. 07/05/2023 1:45 PM Medical Record Number: [id number] Patient Account Number: [id number] Date of Birth/Sex: Treating RN: March 24, 1946 (77 y.o. 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 Bathing/ Shower/ Hygiene May shower; gently cleanse wound with antibacterial soap, rinse and pat dry prior to dressing wounds Off-Loading Open toe surgical shoe Wound Treatment Wound #10 - Calcaneus Wound Laterality: Left Topical:  Mupirocin Ointment 1 x Per Week/30 Days Discharge Instructions: Apply as directed by provider. Prim Dressing: Hydrofera Blue Ready Transfer Foam, 2.5x2.5 (in/in) (Dispense As Written) 1 x Per Week/30 Days ary Discharge Instructions: Apply Hydrofera Blue Ready to wound bed as directed Secondary Dressing: ABD Pad 5x9 (in/in) 1 x Per Week/30 Days Discharge Instructions: Cover with ABD pad Compression Wrap: Urgo K2 Lite, two layer compression system, regular 1 x Per Week/30 Days Electronic Signature(s) Signed: 07/05/2023 5:11:44 PM By: [name] Derry PA-C Signed: 07/05/2023 5:21:21 PM By: Betha Loa Entered By: Betha Loa on 07/05/2023 14:31:09 -------------------------------------------------------------------------------- Problem List Details Patient Name: Date of Service: [name], GEO [name]. 07/05/2023 1:45 PM Medical Record Number: [id number] Patient Account Number: [id number] Date of Birth/Sex: Treating RN: 01-13-1946 (76 y.o. [name] Primary Care Provider: [name] Other Clinician: Betha Loa Referring Provider: Treating Provider/Extender: [name] in Treatment: 76 Active Problems ICD-10 Encounter Code Description Active Date MDM Diagnosis E11.621 Type 2 diabetes mellitus with foot ulcer 06/19/2022 No Yes 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sed 06/19/2022 No Yes [name], [name] ([id number]) [phone].pdf Page 8 of 14 E11.42 Type 2 diabetes mellitus with diabetic polyneuropathy 06/19/2022 No Yes Inactive Problems Resolved Problems Electronic Signature(s) Signed: 07/05/2023 1:48:45 PM By: [name] Derry PA-C Entered By: [name] Derry on 07/05/2023 13:48:45 -------------------------------------------------------------------------------- Progress Note Details Patient Name: Date of Service: [name], GEO [name]. 07/05/2023 1:45 PM Medical Record Number: [id number] Patient  Account Number: [id number] Date of Birth/Sex: Treating RN: 1946/04/18 (76 y.o. [name] Primary Care Provider: [name] Other Clinician: Betha Loa Referring Provider: Treating Provider/Extender: [name] in Treatment: 56 Subjective Chief Complaint Information obtained from Patient Left heel ulcer History of Present Illness (HPI) 09/24/2020 on evaluation today patient presents today for a heel ulcer that he tells me has been present for about 2 years. He has been seeing podiatry and they have been attempting to manage this including what sounds to be a total contact cast, Unna boot, and just standard dressings otherwise as well. Most recently has been using triple antibiotic ointment. With that being said unfortunately despite everything he really has not had any significant improvement. He tells me that he cannot even really remember exactly how this began but he presumed it may have rubbed on his shoes or something of that nature. With that being said he tells me that the other issues that he has majorly is the presence of a artificial heart valve  [name] on 07/05/2023 14:40:25 -------------------------------------------------------------------------------- Physician Orders Details Patient Name: Date of Service: [name]. 07/05/2023 1:45 PM Medical Record Number: [id number] Patient Account Number: [id number] Date of Birth/Sex: Treating RN: March 24, 1946 (77 y.o. 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 Bathing/ Shower/ Hygiene May shower; gently cleanse wound with antibacterial soap, rinse and pat dry prior to dressing wounds Off-Loading Open toe surgical shoe Wound Treatment Wound #10 - Calcaneus Wound Laterality: Left Topical:  Mupirocin Ointment 1 x Per Week/30 Days Discharge Instructions: Apply as directed by provider. Prim Dressing: Hydrofera Blue Ready Transfer Foam, 2.5x2.5 (in/in) (Dispense As Written) 1 x Per Week/30 Days ary Discharge Instructions: Apply Hydrofera Blue Ready to wound bed as directed Secondary Dressing: ABD Pad 5x9 (in/in) 1 x Per Week/30 Days Discharge Instructions: Cover with ABD pad Compression Wrap: Urgo K2 Lite, two layer compression system, regular 1 x Per Week/30 Days Electronic Signature(s) Signed: 07/05/2023 5:11:44 PM By: [name] Derry PA-C Signed: 07/05/2023 5:21:21 PM By: Betha Loa Entered By: Betha Loa on 07/05/2023 14:31:09 -------------------------------------------------------------------------------- Problem List Details Patient Name: Date of Service: [name], GEO [name]. 07/05/2023 1:45 PM Medical Record Number: [id number] Patient Account Number: [id number] Date of Birth/Sex: Treating RN: 01-13-1946 (76 y.o. [name] Primary Care Provider: [name] Other Clinician: Betha Loa Referring Provider: Treating Provider/Extender: [name] in Treatment: 76 Active Problems ICD-10 Encounter Code Description Active Date MDM Diagnosis E11.621 Type 2 diabetes mellitus with foot ulcer 06/19/2022 No Yes 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sed 06/19/2022 No Yes [name], [name] ([id number]) [phone].pdf Page 8 of 14 E11.42 Type 2 diabetes mellitus with diabetic polyneuropathy 06/19/2022 No Yes Inactive Problems Resolved Problems Electronic Signature(s) Signed: 07/05/2023 1:48:45 PM By: [name] Derry PA-C Entered By: [name] Derry on 07/05/2023 13:48:45 -------------------------------------------------------------------------------- Progress Note Details Patient Name: Date of Service: [name], GEO [name]. 07/05/2023 1:45 PM Medical Record Number: [id number] Patient  Account Number: [id number] Date of Birth/Sex: Treating RN: 1946/04/18 (76 y.o. [name] Primary Care Provider: [name] Other Clinician: Betha Loa Referring Provider: Treating Provider/Extender: [name] in Treatment: 56 Subjective Chief Complaint Information obtained from Patient Left heel ulcer History of Present Illness (HPI) 09/24/2020 on evaluation today patient presents today for a heel ulcer that he tells me has been present for about 2 years. He has been seeing podiatry and they have been attempting to manage this including what sounds to be a total contact cast, Unna boot, and just standard dressings otherwise as well. Most recently has been using triple antibiotic ointment. With that being said unfortunately despite everything he really has not had any significant improvement. He tells me that he cannot even really remember exactly how this began but he presumed it may have rubbed on his shoes or something of that nature. With that being said he tells me that the other issues that he has majorly is the presence of a artificial heart valve  [name], [name] ([id number]) [id number] Page 1 of 14 Visit Report for 07/05/2023 Chief Complaint Document Details Patient Name: Date of Service: [name], [name]. 07/05/2023 1:45 PM Medical Record Number: [id number] Patient Account Number: [id number] Date of Birth/Sex: Treating RN: Apr 30, 1946 (77 y.o. [name]) [name] Primary Care Provider: [name] Other Clinician: Betha Loa Referring Provider: Treating Provider/Extender: [name] in Treatment: 50 Information Obtained from: Patient Chief Complaint Left heel ulcer Electronic Signature(s) Signed: 07/05/2023 1:49:01 PM By: [name] Derry PA-C Entered By: [name] Derry on 07/05/2023 13:49:01 -------------------------------------------------------------------------------- Debridement Details Patient Name: Date of Service: [name], GEO [name]. 07/05/2023 1:45 PM Medical Record Number: [id number] Patient Account Number: [id number] Date of Birth/Sex: Treating RN: 03-08-1946 (77 y.o. [name]) [name] Primary Care Provider: [name] Other Clinician: Betha Loa Referring Provider: Treating Provider/Extender: [name] in Treatment: 54 Debridement Performed for Assessment: Wound #10 Left Calcaneus Performed By: Physician [name] Derry, PA-C Debridement Type: Debridement Severity of Tissue Pre Debridement: Fat layer exposed Level of Consciousness (Pre-procedure): Awake and Alert Pre-procedure Verification/Time Out Yes - 14:26 Taken: Start Time: 14:26 Percent of Wound Bed Debrided: 100% T Area Debrided (cm): otal 0.09 Tissue and other material debrided: Viable, Non-Viable, Callus Level: Non-Viable Tissue Debridement Description: Selective/Open Wound Instrument: Curette Bleeding: Minimum Hemostasis Achieved: Pressure Response to Treatment: Procedure was tolerated well Level of Consciousness (Post- Awake and Alert procedure): CONROY, SEITZ ([id number]) [id number] Page 2 of 14 Post Debridement Measurements of Total Wound Length: (cm) 0.3 Width: (cm) 0.4 Depth: (cm) 0.1 Volume: (cm) 0.009 Character of Wound/Ulcer Post Debridement: Improved Severity of Tissue Post Debridement: Fat layer exposed Post Procedure Diagnosis Same as Pre-procedure Electronic Signature(s) Signed: 07/05/2023 5:11:44 PM By: [name] Derry PA-C Signed: 07/05/2023 5:21:21 PM By: Betha Loa Signed: 07/08/2023 1:18:15 PM By: [name] RN Entered By: Betha Loa on 07/05/2023 14:30:05 -------------------------------------------------------------------------------- HPI Details Patient Name: Date of Service: [name], GEO [name]. 07/05/2023 1:45 PM Medical Record Number: [id number] Patient Account Number: [id number] Date of Birth/Sex: Treating RN: 1945/11/09 (77 y.o. [name] Primary Care Provider: [name] Other Clinician: Betha Loa Referring Provider: Treating Provider/Extender: [name] in Treatment: 54 History of Present Illness HPI Description: 09/24/2020 on evaluation today patient presents today for a heel ulcer that he tells me has been present for about 2 years. He has been seeing podiatry and they have been attempting to manage this including what sounds to be a total contact cast, Unna boot, and just standard dressings otherwise as well. Most recently has been using triple antibiotic ointment. With that being said unfortunately despite everything he really has not had any significant improvement. He tells me that he cannot even really remember exactly how this began but he presumed it may have rubbed on his shoes or something of that nature. 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 He also does have chronic  pain in the way of neuropathy which he takes medications for including Cymbalta and methadone. He tells me that this does seem to help. Fortunately there is no signs of active infection at this time. His most recent hemoglobin A1c was 8.1 though he knows this was this year he cannot tell me the exact time. His fluid pills currently to help with some of the lower extremity edema  Number: [id number] Patient Account Number: [id number] Date of Birth/Sex: Treating RN: 06/16/1946 (77 y.o. 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 Facility Procedures : CPT4 Code: 41660630 Description: 97597 - DEBRIDE WOUND 1ST 20 SQ CM OR < ICD-10  Diagnosis Description 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sed Modifier: Quantity: 1 Physician Procedures : CPT4 Code Description Modifier [id number] 97597 - WC PHYS DEBR WO ANESTH 20 SQ CM ICD-10 Diagnosis Description 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sed Quantity: 1 Electronic Signature(s) Signed: 07/05/2023 2:41:32 PM By: [name] Derry PA-C Entered By: [name] Derry on 07/05/2023 14:41:31 [name] ([id number]) [id number] Page 14 of 14

## 2023-07-07 DIAGNOSIS — E782 Mixed hyperlipidemia: Secondary | ICD-10-CM | POA: Diagnosis not present

## 2023-07-07 DIAGNOSIS — Z23 Encounter for immunization: Secondary | ICD-10-CM | POA: Diagnosis not present

## 2023-07-07 DIAGNOSIS — I10 Essential (primary) hypertension: Secondary | ICD-10-CM | POA: Diagnosis not present

## 2023-07-07 DIAGNOSIS — E0590 Thyrotoxicosis, unspecified without thyrotoxic crisis or storm: Secondary | ICD-10-CM | POA: Diagnosis not present

## 2023-07-07 DIAGNOSIS — I480 Paroxysmal atrial fibrillation: Secondary | ICD-10-CM | POA: Diagnosis not present

## 2023-07-07 DIAGNOSIS — Z6837 Body mass index (BMI) 37.0-37.9, adult: Secondary | ICD-10-CM | POA: Diagnosis not present

## 2023-07-07 DIAGNOSIS — E1169 Type 2 diabetes mellitus with other specified complication: Secondary | ICD-10-CM | POA: Diagnosis not present

## 2023-07-07 DIAGNOSIS — Z79899 Other long term (current) drug therapy: Secondary | ICD-10-CM | POA: Diagnosis not present

## 2023-07-08 DIAGNOSIS — R791 Abnormal coagulation profile: Secondary | ICD-10-CM | POA: Diagnosis not present

## 2023-07-08 DIAGNOSIS — E538 Deficiency of other specified B group vitamins: Secondary | ICD-10-CM | POA: Diagnosis not present

## 2023-07-08 NOTE — Progress Notes (Signed)
Weeks in Treatment: 54 Wound Status Wound Number: 10 Primary  Diabetic Wound/Ulcer of the Lower Extremity Etiology: Wound Location: Left Calcaneus Secondary Pressure Ulcer Wounding Event: Gradually Appeared Etiology: Date Acquired: 06/05/2022 Wound Open Weeks Of Treatment: 54 Status: Clustered Wound: No Comorbid Cataracts, Arrhythmia, Coronary Artery Disease, Hypertension, History: Type II Diabetes, Osteoarthritis, Neuropathy [name], [name] ([id number]) [phone].pdf Page 8 of 9 Photos Wound Measurements Length: (cm) 0.3 Width: (cm) 0.4 Depth: (cm) 0.1 Area: (cm) 0.094 Volume: (cm) 0.009 % Reduction in Area: 93.4% % Reduction in Volume: 96.8% Epithelialization: Medium (34-66%) Wound Description Classification: Grade 1 Wound Margin: Flat and Intact Exudate Amount: Medium Exudate Type: Serosanguineous Exudate Color: red, brown Foul Odor After Cleansing: No Slough/Fibrino Yes Wound Bed Granulation Amount: Small (1-33%) Exposed Structure Granulation Quality: Pink Fascia Exposed: No Necrotic Amount: Small (1-33%) Fat Layer (Subcutaneous Tissue) Exposed: Yes Necrotic Quality: Adherent Slough Tendon Exposed: No Muscle Exposed: No Joint Exposed: No Bone Exposed: No Treatment Notes Wound #10 (Calcaneus) Wound Laterality: Left Cleanser Peri-Wound Care Topical Mupirocin Ointment Discharge Instruction: Apply as directed by [name]. Primary Dressing Hydrofera Blue Ready Transfer Foam, 2.5x2.5 (in/in) Discharge Instruction: Apply Hydrofera Blue Ready to wound bed as directed Secondary Dressing ABD Pad 5x9 (in/in) Discharge Instruction: Cover with ABD pad Secured With Compression Wrap Urgo K2 Lite, two layer compression system, regular Compression Stockings Add-Ons Electronic Signature(s) Signed: 07/05/2023 5:21:21 PM By: [name] Signed: 07/08/2023 1:18:15 PM By: [name] Pax RN Entered By: [name] on 07/05/2023 14:00:25 [name] ([id number]) [phone].pdf  Page 9 of 9 -------------------------------------------------------------------------------- Vitals Details Patient Name: Date of Service: [name], [name] [name] 07/05/2023 1:45 PM Medical Record Number: [id number] Patient Account Number: [id number] Date of Birth/Sex: Treating RN: [birth year] (77 y.o. [name]) [name] Primary Care [name]: [name] Other Clinician: Betha [name] Referring [name]: Treating [name]/Extender: [name] in Treatment: 70 Vital Signs Time Taken: 13:46 Temperature (F): 98.1 Height (in): 70 Pulse (bpm): 70 Weight (lbs): 265 Respiratory Rate (breaths/min): 18 Body Mass Index (BMI): 38 Blood Pressure (mmHg): 142/67 Reference Range: 80 - 120 mg / dl Electronic Signature(s) Signed: 07/05/2023 5:21:21 PM By: [name] Entered By: [name] on 07/05/2023 13:49:13  cm) : rea 0.009 N/A N/A Volume (cm) : 93.40% N/A N/A % Reduction in A rea: 96.80% N/A N/A % Reduction in Volume: Grade 1 N/A N/A Classification: Medium N/A N/A Exudate A mount: Serosanguineous N/A N/A Exudate Type: red, brown N/A N/A Exudate Color: Flat and Intact N/A N/A Wound Margin: Small (1-33%) N/A N/A Granulation A mount: Pink N/A N/A Granulation Quality: Small (1-33%) N/A N/A Necrotic A mount: Fat Layer (Subcutaneous Tissue): Yes N/A N/A Exposed Structures: Fascia: No Tendon: No Muscle: No Joint: No Bone: No Medium (34-66%) N/A N/A Epithelialization: Treatment Notes Electronic Signature(s) Signed: 07/05/2023 5:21:21 PM By: [name] Entered By: [name] on 07/05/2023 14:02:19 -------------------------------------------------------------------------------- Multi-Disciplinary Care Plan Details Patient Name: Date of Service: [name], [name] [name] 07/05/2023 1:45 PM Medical Record  Number: [id number] Patient Account Number: [id number] Date of Birth/Sex: Treating RN: 1946-04-24 (77 y.o. [name] Primary Care [name]: [name] Other Clinician: Betha [name] Referring [name]: Treating [name]/Extender: [name] in Treatment: 54 Active Inactive Wound/Skin Impairment Nursing Diagnoses: Impaired tissue integrity Goals: Patient/caregiver will verbalize understanding of skin care regimen Date Initiated: 06/19/2022 Target Resolution Date: 07/19/2022 Goal Status: Active Ulcer/skin breakdown will have a volume reduction of 30% by week 4 [name] ([id number]) [id number] Page 6 of 9 Date Initiated: 06/19/2022 Date Inactivated: 07/28/2022 Target Resolution Date: 07/17/2022 Goal Status: Unmet Unmet Reason: infection Ulcer/skin breakdown will have a volume reduction of 50% by week 8 Date Initiated: 07/28/2022 Date Inactivated: 05/18/2023 Target Resolution Date: 03/20/2023 Goal Status: Unmet Unmet Reason: comorbidities Interventions: Assess patient/caregiver ability to obtain necessary supplies Assess patient/caregiver ability to perform ulcer/skin care regimen upon admission and as needed Assess ulceration(s) every visit Provide education on ulcer and skin care Treatment Activities: Skin care regimen initiated : 06/19/2022 Topical wound management initiated : 06/19/2022 Notes: Electronic Signature(s) Signed: 07/05/2023 5:21:21 PM By: [name] Signed: 07/08/2023 1:18:15 PM By: [name] Pax RN Entered By: [name] on 07/05/2023 17:19:08 -------------------------------------------------------------------------------- Pain Assessment Details Patient Name: Date of Service: [name], [name] [name] 07/05/2023 1:45 PM Medical Record Number: [id number] Patient Account Number: [id number] Date of Birth/Sex: Treating RN: [birth year] (77 y.o. [name] Primary Care [name]: [name] Other  Clinician: Betha [name] Referring [name]: Treating [name]/Extender: [name] in Treatment: 54 Active Problems Location of Pain Severity and Description of Pain Patient Has Paino No Site Locations Pain Management and Medication Current Pain Management: Electronic Signature(s) Signed: 07/05/2023 5:21:21 PM By: [name] ([id number]) [id number] Page 7 of 9 Signed: 07/08/2023 1:18:15 PM By: [name] Pax RN Entered By: [name] on 07/05/2023 13:49:19 -------------------------------------------------------------------------------- Patient/Caregiver Education Details Patient Name: Date of Service: [name], [name] Cherrie Gauze 9/16/2024andnbsp1:45 PM Medical Record Number: [id number] Patient Account Number: [id number] Date of Birth/Gender: Treating RN: 27-May-1946 (76 y.o. [name]) [name] Primary Care Physician: [name] Other Clinician: Betha [name] Referring Physician: Treating Physician/Extender: [name] in Treatment: 78 Education Assessment Education Provided To: Patient Education Topics Provided Wound/Skin Impairment: Handouts: Other: continue wound care as directed Methods: Explain/Verbal Responses: State content correctly Electronic Signature(s) Signed: 07/05/2023 5:21:21 PM By: [name] Entered By: [name] on 07/05/2023 17:19:32 -------------------------------------------------------------------------------- Wound Assessment Details Patient Name: Date of Service: [name], [name] [name] 07/05/2023 1:45 PM Medical Record Number: [id number] Patient Account Number: [id number] Date of Birth/Sex: Treating RN: 05/24/1946 (76 y.o. [name] Primary Care Nocholas Damaso: [name] Other Clinician: Betha [name] Referring Atif Chapple: Treating Nehemyah Foushee/Extender: Hermine Messick  cm) : rea 0.009 N/A N/A Volume (cm) : 93.40% N/A N/A % Reduction in A rea: 96.80% N/A N/A % Reduction in Volume: Grade 1 N/A N/A Classification: Medium N/A N/A Exudate A mount: Serosanguineous N/A N/A Exudate Type: red, brown N/A N/A Exudate Color: Flat and Intact N/A N/A Wound Margin: Small (1-33%) N/A N/A Granulation A mount: Pink N/A N/A Granulation Quality: Small (1-33%) N/A N/A Necrotic A mount: Fat Layer (Subcutaneous Tissue): Yes N/A N/A Exposed Structures: Fascia: No Tendon: No Muscle: No Joint: No Bone: No Medium (34-66%) N/A N/A Epithelialization: Treatment Notes Electronic Signature(s) Signed: 07/05/2023 5:21:21 PM By: [name] Entered By: [name] on 07/05/2023 14:02:19 -------------------------------------------------------------------------------- Multi-Disciplinary Care Plan Details Patient Name: Date of Service: [name], [name] [name] 07/05/2023 1:45 PM Medical Record  Number: [id number] Patient Account Number: [id number] Date of Birth/Sex: Treating RN: 1946-04-24 (77 y.o. [name] Primary Care [name]: [name] Other Clinician: Betha [name] Referring [name]: Treating [name]/Extender: [name] in Treatment: 54 Active Inactive Wound/Skin Impairment Nursing Diagnoses: Impaired tissue integrity Goals: Patient/caregiver will verbalize understanding of skin care regimen Date Initiated: 06/19/2022 Target Resolution Date: 07/19/2022 Goal Status: Active Ulcer/skin breakdown will have a volume reduction of 30% by week 4 [name] ([id number]) [id number] Page 6 of 9 Date Initiated: 06/19/2022 Date Inactivated: 07/28/2022 Target Resolution Date: 07/17/2022 Goal Status: Unmet Unmet Reason: infection Ulcer/skin breakdown will have a volume reduction of 50% by week 8 Date Initiated: 07/28/2022 Date Inactivated: 05/18/2023 Target Resolution Date: 03/20/2023 Goal Status: Unmet Unmet Reason: comorbidities Interventions: Assess patient/caregiver ability to obtain necessary supplies Assess patient/caregiver ability to perform ulcer/skin care regimen upon admission and as needed Assess ulceration(s) every visit Provide education on ulcer and skin care Treatment Activities: Skin care regimen initiated : 06/19/2022 Topical wound management initiated : 06/19/2022 Notes: Electronic Signature(s) Signed: 07/05/2023 5:21:21 PM By: [name] Signed: 07/08/2023 1:18:15 PM By: [name] Pax RN Entered By: [name] on 07/05/2023 17:19:08 -------------------------------------------------------------------------------- Pain Assessment Details Patient Name: Date of Service: [name], [name] [name] 07/05/2023 1:45 PM Medical Record Number: [id number] Patient Account Number: [id number] Date of Birth/Sex: Treating RN: [birth year] (77 y.o. [name] Primary Care [name]: [name] Other  Clinician: Betha [name] Referring [name]: Treating [name]/Extender: [name] in Treatment: 54 Active Problems Location of Pain Severity and Description of Pain Patient Has Paino No Site Locations Pain Management and Medication Current Pain Management: Electronic Signature(s) Signed: 07/05/2023 5:21:21 PM By: [name] ([id number]) [id number] Page 7 of 9 Signed: 07/08/2023 1:18:15 PM By: [name] Pax RN Entered By: [name] on 07/05/2023 13:49:19 -------------------------------------------------------------------------------- Patient/Caregiver Education Details Patient Name: Date of Service: [name], [name] Cherrie Gauze 9/16/2024andnbsp1:45 PM Medical Record Number: [id number] Patient Account Number: [id number] Date of Birth/Gender: Treating RN: 27-May-1946 (76 y.o. [name]) [name] Primary Care Physician: [name] Other Clinician: Betha [name] Referring Physician: Treating Physician/Extender: [name] in Treatment: 78 Education Assessment Education Provided To: Patient Education Topics Provided Wound/Skin Impairment: Handouts: Other: continue wound care as directed Methods: Explain/Verbal Responses: State content correctly Electronic Signature(s) Signed: 07/05/2023 5:21:21 PM By: [name] Entered By: [name] on 07/05/2023 17:19:32 -------------------------------------------------------------------------------- Wound Assessment Details Patient Name: Date of Service: [name], [name] [name] 07/05/2023 1:45 PM Medical Record Number: [id number] Patient Account Number: [id number] Date of Birth/Sex: Treating RN: 05/24/1946 (76 y.o. [name] Primary Care Nocholas Damaso: [name] Other Clinician: Betha [name] Referring Atif Chapple: Treating Nehemyah Foushee/Extender: Hermine Messick  cm) : rea 0.009 N/A N/A Volume (cm) : 93.40% N/A N/A % Reduction in A rea: 96.80% N/A N/A % Reduction in Volume: Grade 1 N/A N/A Classification: Medium N/A N/A Exudate A mount: Serosanguineous N/A N/A Exudate Type: red, brown N/A N/A Exudate Color: Flat and Intact N/A N/A Wound Margin: Small (1-33%) N/A N/A Granulation A mount: Pink N/A N/A Granulation Quality: Small (1-33%) N/A N/A Necrotic A mount: Fat Layer (Subcutaneous Tissue): Yes N/A N/A Exposed Structures: Fascia: No Tendon: No Muscle: No Joint: No Bone: No Medium (34-66%) N/A N/A Epithelialization: Treatment Notes Electronic Signature(s) Signed: 07/05/2023 5:21:21 PM By: [name] Entered By: [name] on 07/05/2023 14:02:19 -------------------------------------------------------------------------------- Multi-Disciplinary Care Plan Details Patient Name: Date of Service: [name], [name] [name] 07/05/2023 1:45 PM Medical Record  Number: [id number] Patient Account Number: [id number] Date of Birth/Sex: Treating RN: 1946-04-24 (77 y.o. [name] Primary Care [name]: [name] Other Clinician: Betha [name] Referring [name]: Treating [name]/Extender: [name] in Treatment: 54 Active Inactive Wound/Skin Impairment Nursing Diagnoses: Impaired tissue integrity Goals: Patient/caregiver will verbalize understanding of skin care regimen Date Initiated: 06/19/2022 Target Resolution Date: 07/19/2022 Goal Status: Active Ulcer/skin breakdown will have a volume reduction of 30% by week 4 [name] ([id number]) [id number] Page 6 of 9 Date Initiated: 06/19/2022 Date Inactivated: 07/28/2022 Target Resolution Date: 07/17/2022 Goal Status: Unmet Unmet Reason: infection Ulcer/skin breakdown will have a volume reduction of 50% by week 8 Date Initiated: 07/28/2022 Date Inactivated: 05/18/2023 Target Resolution Date: 03/20/2023 Goal Status: Unmet Unmet Reason: comorbidities Interventions: Assess patient/caregiver ability to obtain necessary supplies Assess patient/caregiver ability to perform ulcer/skin care regimen upon admission and as needed Assess ulceration(s) every visit Provide education on ulcer and skin care Treatment Activities: Skin care regimen initiated : 06/19/2022 Topical wound management initiated : 06/19/2022 Notes: Electronic Signature(s) Signed: 07/05/2023 5:21:21 PM By: [name] Signed: 07/08/2023 1:18:15 PM By: [name] Pax RN Entered By: [name] on 07/05/2023 17:19:08 -------------------------------------------------------------------------------- Pain Assessment Details Patient Name: Date of Service: [name], [name] [name] 07/05/2023 1:45 PM Medical Record Number: [id number] Patient Account Number: [id number] Date of Birth/Sex: Treating RN: [birth year] (77 y.o. [name] Primary Care [name]: [name] Other  Clinician: Betha [name] Referring [name]: Treating [name]/Extender: [name] in Treatment: 54 Active Problems Location of Pain Severity and Description of Pain Patient Has Paino No Site Locations Pain Management and Medication Current Pain Management: Electronic Signature(s) Signed: 07/05/2023 5:21:21 PM By: [name] ([id number]) [id number] Page 7 of 9 Signed: 07/08/2023 1:18:15 PM By: [name] Pax RN Entered By: [name] on 07/05/2023 13:49:19 -------------------------------------------------------------------------------- Patient/Caregiver Education Details Patient Name: Date of Service: [name], [name] Cherrie Gauze 9/16/2024andnbsp1:45 PM Medical Record Number: [id number] Patient Account Number: [id number] Date of Birth/Gender: Treating RN: 27-May-1946 (76 y.o. [name]) [name] Primary Care Physician: [name] Other Clinician: Betha [name] Referring Physician: Treating Physician/Extender: [name] in Treatment: 78 Education Assessment Education Provided To: Patient Education Topics Provided Wound/Skin Impairment: Handouts: Other: continue wound care as directed Methods: Explain/Verbal Responses: State content correctly Electronic Signature(s) Signed: 07/05/2023 5:21:21 PM By: [name] Entered By: [name] on 07/05/2023 17:19:32 -------------------------------------------------------------------------------- Wound Assessment Details Patient Name: Date of Service: [name], [name] [name] 07/05/2023 1:45 PM Medical Record Number: [id number] Patient Account Number: [id number] Date of Birth/Sex: Treating RN: 05/24/1946 (76 y.o. [name] Primary Care Nocholas Damaso: [name] Other Clinician: Betha [name] Referring Atif Chapple: Treating Nehemyah Foushee/Extender: Hermine Messick

## 2023-07-12 ENCOUNTER — Encounter: Payer: Medicare HMO | Admitting: Physician Assistant

## 2023-07-12 DIAGNOSIS — Z952 Presence of prosthetic heart valve: Secondary | ICD-10-CM | POA: Diagnosis not present

## 2023-07-12 DIAGNOSIS — L97422 Non-pressure chronic ulcer of left heel and midfoot with fat layer exposed: Secondary | ICD-10-CM | POA: Diagnosis not present

## 2023-07-12 DIAGNOSIS — I10 Essential (primary) hypertension: Secondary | ICD-10-CM | POA: Diagnosis not present

## 2023-07-12 DIAGNOSIS — E1142 Type 2 diabetes mellitus with diabetic polyneuropathy: Secondary | ICD-10-CM | POA: Diagnosis not present

## 2023-07-12 DIAGNOSIS — G8929 Other chronic pain: Secondary | ICD-10-CM | POA: Diagnosis not present

## 2023-07-12 DIAGNOSIS — L97522 Non-pressure chronic ulcer of other part of left foot with fat layer exposed: Secondary | ICD-10-CM | POA: Diagnosis not present

## 2023-07-12 DIAGNOSIS — E1151 Type 2 diabetes mellitus with diabetic peripheral angiopathy without gangrene: Secondary | ICD-10-CM | POA: Diagnosis not present

## 2023-07-12 DIAGNOSIS — Z7901 Long term (current) use of anticoagulants: Secondary | ICD-10-CM | POA: Diagnosis not present

## 2023-07-12 DIAGNOSIS — E11621 Type 2 diabetes mellitus with foot ulcer: Secondary | ICD-10-CM | POA: Diagnosis not present

## 2023-07-12 NOTE — Progress Notes (Addendum)
pat dry prior to dressing wounds Off-Loading Open toe surgical shoe Medications-Please add to medication list. ntibiotics - start Bactrim DS P.O. A Wound Treatment Wound #10 - Calcaneus Wound Laterality: Left Topical: Mupirocin Ointment 1 x Per Week/30 Days Discharge Instructions: Apply as directed by provider. Prim Dressing: Hydrofera Blue Ready Transfer Foam, 2.5x2.5 (in/in) (Dispense As Written) 1 x Per Week/30 Days ary Discharge Instructions: Apply Hydrofera Blue Ready to wound bed as directed Secondary Dressing: ABD Pad 5x9 (in/in) 1 x Per Week/30 Days Discharge Instructions: Cover with ABD pad Compression Wrap: Urgo K2 Lite, two layer compression system, regular 1 x Per Week/30 Days Patient Medications llergies: tetracycline A Notifications Medication Indication Start End 07/12/2023 Bactrim DS DOSE 1 - oral 800 mg-160 mg tablet - 1 tablet oral twice a day x 14 days Electronic Signature(s) Signed: 07/12/2023 2:44:17 PM By: [name] Derry PA-C Entered By: [name] Derry on 07/12/2023 14:44:17 Barrie Folk ([id number]) [id number] Page 7 of 13 -------------------------------------------------------------------------------- Problem List Details Patient Name: Date of Service: [name], [name] 07/12/2023 1:45 PM Medical Record Number: [id number] Patient Account Number: [id number] Date of Birth/Sex: Treating RN: 10/10/1946 (77 y.o. [name]) [name] Primary Care Provider: [name] Other  Clinician: Betha Loa Referring Provider: Treating Provider/Extender: [name] in Treatment: 75 Active Problems ICD-10 Encounter Code Description Active Date MDM Diagnosis E11.621 Type 2 diabetes mellitus with foot ulcer 06/19/2022 No Yes 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sed 06/19/2022 No Yes E11.42 Type 2 diabetes mellitus with diabetic polyneuropathy 06/19/2022 No Yes Inactive Problems Resolved Problems Electronic Signature(s) Signed: 07/12/2023 1:57:42 PM By: [name] Derry PA-C Entered By: [name] Derry on 07/12/2023 13:57:42 -------------------------------------------------------------------------------- Progress Note Details Patient Name: Date of Service: [name], [name] 07/12/2023 1:45 PM Medical Record Number: [id number] Patient Account Number: [id number] Date of Birth/Sex: Treating RN: 02-07-1946 (77 y.o. [name]) [name] Primary Care Provider: [name] Other Clinician: Betha Loa Referring Provider: Treating Provider/Extender: [name] in Treatment: 55 Subjective Chief Complaint Information obtained from Patient [name], [name] ([id number]) [id number] Page 8 of 13 Left heel ulcer History of Present Illness (HPI) 09/24/2020 on evaluation today patient presents today for a heel ulcer that he tells me has been present for about 2 years. He has been seeing podiatry and they have been attempting to manage this including what sounds to be a total contact cast, Unna boot, and just standard dressings otherwise as well. Most recently has been using triple antibiotic ointment. With that being said unfortunately despite everything he really has not had any significant improvement. He tells me that he cannot even really remember exactly how this began but he presumed it may have rubbed on his shoes or something of that nature. 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 He tells me that this does seem to help. Fortunately there is no signs of active infection at this time. His most recent hemoglobin A1c was 8.1 though he knows this was this year he cannot tell me the exact time. His fluid pills currently to help with some of the lower extremity edema although he does obviously have signs of venous stasis/lymphedema. Currently there  pat dry prior to dressing wounds Off-Loading Open toe surgical shoe Medications-Please add to medication list. ntibiotics - start Bactrim DS P.O. A Wound Treatment Wound #10 - Calcaneus Wound Laterality: Left Topical: Mupirocin Ointment 1 x Per Week/30 Days Discharge Instructions: Apply as directed by provider. Prim Dressing: Hydrofera Blue Ready Transfer Foam, 2.5x2.5 (in/in) (Dispense As Written) 1 x Per Week/30 Days ary Discharge Instructions: Apply Hydrofera Blue Ready to wound bed as directed Secondary Dressing: ABD Pad 5x9 (in/in) 1 x Per Week/30 Days Discharge Instructions: Cover with ABD pad Compression Wrap: Urgo K2 Lite, two layer compression system, regular 1 x Per Week/30 Days Patient Medications llergies: tetracycline A Notifications Medication Indication Start End 07/12/2023 Bactrim DS DOSE 1 - oral 800 mg-160 mg tablet - 1 tablet oral twice a day x 14 days Electronic Signature(s) Signed: 07/12/2023 2:44:17 PM By: [name] Derry PA-C Entered By: [name] Derry on 07/12/2023 14:44:17 Barrie Folk ([id number]) [id number] Page 7 of 13 -------------------------------------------------------------------------------- Problem List Details Patient Name: Date of Service: [name], [name] 07/12/2023 1:45 PM Medical Record Number: [id number] Patient Account Number: [id number] Date of Birth/Sex: Treating RN: 10/10/1946 (77 y.o. [name]) [name] Primary Care Provider: [name] Other  Clinician: Betha Loa Referring Provider: Treating Provider/Extender: [name] in Treatment: 75 Active Problems ICD-10 Encounter Code Description Active Date MDM Diagnosis E11.621 Type 2 diabetes mellitus with foot ulcer 06/19/2022 No Yes 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sed 06/19/2022 No Yes E11.42 Type 2 diabetes mellitus with diabetic polyneuropathy 06/19/2022 No Yes Inactive Problems Resolved Problems Electronic Signature(s) Signed: 07/12/2023 1:57:42 PM By: [name] Derry PA-C Entered By: [name] Derry on 07/12/2023 13:57:42 -------------------------------------------------------------------------------- Progress Note Details Patient Name: Date of Service: [name], [name] 07/12/2023 1:45 PM Medical Record Number: [id number] Patient Account Number: [id number] Date of Birth/Sex: Treating RN: 02-07-1946 (77 y.o. [name]) [name] Primary Care Provider: [name] Other Clinician: Betha Loa Referring Provider: Treating Provider/Extender: [name] in Treatment: 55 Subjective Chief Complaint Information obtained from Patient [name], [name] ([id number]) [id number] Page 8 of 13 Left heel ulcer History of Present Illness (HPI) 09/24/2020 on evaluation today patient presents today for a heel ulcer that he tells me has been present for about 2 years. He has been seeing podiatry and they have been attempting to manage this including what sounds to be a total contact cast, Unna boot, and just standard dressings otherwise as well. Most recently has been using triple antibiotic ointment. With that being said unfortunately despite everything he really has not had any significant improvement. He tells me that he cannot even really remember exactly how this began but he presumed it may have rubbed on his shoes or something of that nature. 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 He tells me that this does seem to help. Fortunately there is no signs of active infection at this time. His most recent hemoglobin A1c was 8.1 though he knows this was this year he cannot tell me the exact time. His fluid pills currently to help with some of the lower extremity edema although he does obviously have signs of venous stasis/lymphedema. Currently there  diabetic. We have been using Hydrofera Blue under Urgo K2 lite wrap he has been in his surgical shoe although he tells me it disintegrated and he came in on a regular running shoe today. 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 He is not wearing compression stocking 06-22-2023 upon evaluation today patient appears to be doing excellent in regard to his wound on the heel which is actually significantly smaller compared to last week's evaluation. Fortunately I do not see any signs of active infection locally or systemically at this time. 06-28-2023 upon evaluation today patient appears to be doing well currently in regard to his heel which is actually showing signs of improvement. Fortunately I do not see any evidence of active infection at this time which is great news and in general I do believe that we are making headway towards complete closure which is good news. No fevers, chills, nausea, vomiting, or diarrhea. 07-05-2023 upon evaluation today patient appears to be doing well currently in regard to his heel ulcer. This is actually very close I think that being completely healed. This is great news and overall very pleased with where we stand today. 07-12-2023 upon evaluation today patient appears to be doing well currently in regard to his wound. He in fact appears to be very close to complete resolution I am very pleased that regard. However on the tip of his foot there appears to be some issue here with inflammation secondary to infection. I am very concerned about the infection at the site. Electronic Signature(s) Signed: 07/12/2023 2:41:27 PM By: [name] Derry  PA-C Entered By: [name] Derry on 07/12/2023 14:41:26 -------------------------------------------------------------------------------- Physical Exam Details Patient Name: Date of Service: [name], [name] 07/12/2023 1:45 PM Medical Record Number: [id number] Patient Account Number: [id number] Date of Birth/Sex: Treating RN: [birth year] (77 y.o. [name] Primary Care Provider: [name] Other Clinician: Betha Loa Referring Provider: Treating Provider/Extender: Hermine Messick Weeks in Treatment: 35 Constitutional Well-nourished and well-hydrated in no acute distress. Respiratory normal breathing without difficulty. Psychiatric this patient is able to make decisions and demonstrates good insight into disease process. Alert and Oriented x 3. pleasant and cooperative. Notes Upon inspection patient's wound bed actually showed signs of good granulation epithelization at this point. Fortunately I do not see any signs of infection locally or systemically anywhere except for the distal portion of his foot this is nowhere unrelated shin to the wound but does appear to be of concern. I am going to get him started on antibiotic here today as well. Electronic Signature(s) Signed: 07/12/2023 2:41:50 PM By: [name] Derry PA-C Entered By: [name] Derry on 07/12/2023 14:41:50 Barrie Folk ([id number]) [id number] Page 6 of 13 -------------------------------------------------------------------------------- Physician Orders Details Patient Name: Date of Service: [name] J. 07/12/2023 1:45 PM Medical Record Number: [id number] Patient Account Number: [id number] Date of Birth/Sex: Treating RN: April 29, 1946 (77 y.o. 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 Bathing/ Shower/ Hygiene May shower; gently cleanse wound with antibacterial soap, rinse and  pat dry prior to dressing wounds Off-Loading Open toe surgical shoe Medications-Please add to medication list. ntibiotics - start Bactrim DS P.O. A Wound Treatment Wound #10 - Calcaneus Wound Laterality: Left Topical: Mupirocin Ointment 1 x Per Week/30 Days Discharge Instructions: Apply as directed by provider. Prim Dressing: Hydrofera Blue Ready Transfer Foam, 2.5x2.5 (in/in) (Dispense As Written) 1 x Per Week/30 Days ary Discharge Instructions: Apply Hydrofera Blue Ready to wound bed as directed Secondary Dressing: ABD Pad 5x9 (in/in) 1 x Per Week/30 Days Discharge Instructions: Cover with ABD pad Compression Wrap: Urgo K2 Lite, two layer compression system, regular 1 x Per Week/30 Days Patient Medications llergies: tetracycline A Notifications Medication Indication Start End 07/12/2023 Bactrim DS DOSE 1 - oral 800 mg-160 mg tablet - 1 tablet oral twice a day x 14 days Electronic Signature(s) Signed: 07/12/2023 2:44:17 PM By: [name] Derry PA-C Entered By: [name] Derry on 07/12/2023 14:44:17 Barrie Folk ([id number]) [id number] Page 7 of 13 -------------------------------------------------------------------------------- Problem List Details Patient Name: Date of Service: [name], [name] 07/12/2023 1:45 PM Medical Record Number: [id number] Patient Account Number: [id number] Date of Birth/Sex: Treating RN: 10/10/1946 (77 y.o. [name]) [name] Primary Care Provider: [name] Other  Clinician: Betha Loa Referring Provider: Treating Provider/Extender: [name] in Treatment: 75 Active Problems ICD-10 Encounter Code Description Active Date MDM Diagnosis E11.621 Type 2 diabetes mellitus with foot ulcer 06/19/2022 No Yes 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sed 06/19/2022 No Yes E11.42 Type 2 diabetes mellitus with diabetic polyneuropathy 06/19/2022 No Yes Inactive Problems Resolved Problems Electronic Signature(s) Signed: 07/12/2023 1:57:42 PM By: [name] Derry PA-C Entered By: [name] Derry on 07/12/2023 13:57:42 -------------------------------------------------------------------------------- Progress Note Details Patient Name: Date of Service: [name], [name] 07/12/2023 1:45 PM Medical Record Number: [id number] Patient Account Number: [id number] Date of Birth/Sex: Treating RN: 02-07-1946 (77 y.o. [name]) [name] Primary Care Provider: [name] Other Clinician: Betha Loa Referring Provider: Treating Provider/Extender: [name] in Treatment: 55 Subjective Chief Complaint Information obtained from Patient [name], [name] ([id number]) [id number] Page 8 of 13 Left heel ulcer History of Present Illness (HPI) 09/24/2020 on evaluation today patient presents today for a heel ulcer that he tells me has been present for about 2 years. He has been seeing podiatry and they have been attempting to manage this including what sounds to be a total contact cast, Unna boot, and just standard dressings otherwise as well. Most recently has been using triple antibiotic ointment. With that being said unfortunately despite everything he really has not had any significant improvement. He tells me that he cannot even really remember exactly how this began but he presumed it may have rubbed on his shoes or something of that nature. 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 He tells me that this does seem to help. Fortunately there is no signs of active infection at this time. His most recent hemoglobin A1c was 8.1 though he knows this was this year he cannot tell me the exact time. His fluid pills currently to help with some of the lower extremity edema although he does obviously have signs of venous stasis/lymphedema. Currently there  diabetic. We have been using Hydrofera Blue under Urgo K2 lite wrap he has been in his surgical shoe although he tells me it disintegrated and he came in on a regular running shoe today. 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 He is not wearing compression stocking 06-22-2023 upon evaluation today patient appears to be doing excellent in regard to his wound on the heel which is actually significantly smaller compared to last week's evaluation. Fortunately I do not see any signs of active infection locally or systemically at this time. 06-28-2023 upon evaluation today patient appears to be doing well currently in regard to his heel which is actually showing signs of improvement. Fortunately I do not see any evidence of active infection at this time which is great news and in general I do believe that we are making headway towards complete closure which is good news. No fevers, chills, nausea, vomiting, or diarrhea. 07-05-2023 upon evaluation today patient appears to be doing well currently in regard to his heel ulcer. This is actually very close I think that being completely healed. This is great news and overall very pleased with where we stand today. 07-12-2023 upon evaluation today patient appears to be doing well currently in regard to his wound. He in fact appears to be very close to complete resolution I am very pleased that regard. However on the tip of his foot there appears to be some issue here with inflammation secondary to infection. I am very concerned about the infection at the site. Electronic Signature(s) Signed: 07/12/2023 2:41:27 PM By: [name] Derry  PA-C Entered By: [name] Derry on 07/12/2023 14:41:26 -------------------------------------------------------------------------------- Physical Exam Details Patient Name: Date of Service: [name], [name] 07/12/2023 1:45 PM Medical Record Number: [id number] Patient Account Number: [id number] Date of Birth/Sex: Treating RN: [birth year] (77 y.o. [name] Primary Care Provider: [name] Other Clinician: Betha Loa Referring Provider: Treating Provider/Extender: Hermine Messick Weeks in Treatment: 35 Constitutional Well-nourished and well-hydrated in no acute distress. Respiratory normal breathing without difficulty. Psychiatric this patient is able to make decisions and demonstrates good insight into disease process. Alert and Oriented x 3. pleasant and cooperative. Notes Upon inspection patient's wound bed actually showed signs of good granulation epithelization at this point. Fortunately I do not see any signs of infection locally or systemically anywhere except for the distal portion of his foot this is nowhere unrelated shin to the wound but does appear to be of concern. I am going to get him started on antibiotic here today as well. Electronic Signature(s) Signed: 07/12/2023 2:41:50 PM By: [name] Derry PA-C Entered By: [name] Derry on 07/12/2023 14:41:50 Barrie Folk ([id number]) [id number] Page 6 of 13 -------------------------------------------------------------------------------- Physician Orders Details Patient Name: Date of Service: [name] J. 07/12/2023 1:45 PM Medical Record Number: [id number] Patient Account Number: [id number] Date of Birth/Sex: Treating RN: April 29, 1946 (77 y.o. 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 Bathing/ Shower/ Hygiene May shower; gently cleanse wound with antibacterial soap, rinse and  pat dry prior to dressing wounds Off-Loading Open toe surgical shoe Medications-Please add to medication list. ntibiotics - start Bactrim DS P.O. A Wound Treatment Wound #10 - Calcaneus Wound Laterality: Left Topical: Mupirocin Ointment 1 x Per Week/30 Days Discharge Instructions: Apply as directed by provider. Prim Dressing: Hydrofera Blue Ready Transfer Foam, 2.5x2.5 (in/in) (Dispense As Written) 1 x Per Week/30 Days ary Discharge Instructions: Apply Hydrofera Blue Ready to wound bed as directed Secondary Dressing: ABD Pad 5x9 (in/in) 1 x Per Week/30 Days Discharge Instructions: Cover with ABD pad Compression Wrap: Urgo K2 Lite, two layer compression system, regular 1 x Per Week/30 Days Patient Medications llergies: tetracycline A Notifications Medication Indication Start End 07/12/2023 Bactrim DS DOSE 1 - oral 800 mg-160 mg tablet - 1 tablet oral twice a day x 14 days Electronic Signature(s) Signed: 07/12/2023 2:44:17 PM By: [name] Derry PA-C Entered By: [name] Derry on 07/12/2023 14:44:17 Barrie Folk ([id number]) [id number] Page 7 of 13 -------------------------------------------------------------------------------- Problem List Details Patient Name: Date of Service: [name], [name] 07/12/2023 1:45 PM Medical Record Number: [id number] Patient Account Number: [id number] Date of Birth/Sex: Treating RN: 10/10/1946 (77 y.o. [name]) [name] Primary Care Provider: [name] Other  Clinician: Betha Loa Referring Provider: Treating Provider/Extender: [name] in Treatment: 75 Active Problems ICD-10 Encounter Code Description Active Date MDM Diagnosis E11.621 Type 2 diabetes mellitus with foot ulcer 06/19/2022 No Yes 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sed 06/19/2022 No Yes E11.42 Type 2 diabetes mellitus with diabetic polyneuropathy 06/19/2022 No Yes Inactive Problems Resolved Problems Electronic Signature(s) Signed: 07/12/2023 1:57:42 PM By: [name] Derry PA-C Entered By: [name] Derry on 07/12/2023 13:57:42 -------------------------------------------------------------------------------- Progress Note Details Patient Name: Date of Service: [name], [name] 07/12/2023 1:45 PM Medical Record Number: [id number] Patient Account Number: [id number] Date of Birth/Sex: Treating RN: 02-07-1946 (77 y.o. [name]) [name] Primary Care Provider: [name] Other Clinician: Betha Loa Referring Provider: Treating Provider/Extender: [name] in Treatment: 55 Subjective Chief Complaint Information obtained from Patient [name], [name] ([id number]) [id number] Page 8 of 13 Left heel ulcer History of Present Illness (HPI) 09/24/2020 on evaluation today patient presents today for a heel ulcer that he tells me has been present for about 2 years. He has been seeing podiatry and they have been attempting to manage this including what sounds to be a total contact cast, Unna boot, and just standard dressings otherwise as well. Most recently has been using triple antibiotic ointment. With that being said unfortunately despite everything he really has not had any significant improvement. He tells me that he cannot even really remember exactly how this began but he presumed it may have rubbed on his shoes or something of that nature. 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 He tells me that this does seem to help. Fortunately there is no signs of active infection at this time. His most recent hemoglobin A1c was 8.1 though he knows this was this year he cannot tell me the exact time. His fluid pills currently to help with some of the lower extremity edema although he does obviously have signs of venous stasis/lymphedema. Currently there  diabetic. We have been using Hydrofera Blue under Urgo K2 lite wrap he has been in his surgical shoe although he tells me it disintegrated and he came in on a regular running shoe today. 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 He is not wearing compression stocking 06-22-2023 upon evaluation today patient appears to be doing excellent in regard to his wound on the heel which is actually significantly smaller compared to last week's evaluation. Fortunately I do not see any signs of active infection locally or systemically at this time. 06-28-2023 upon evaluation today patient appears to be doing well currently in regard to his heel which is actually showing signs of improvement. Fortunately I do not see any evidence of active infection at this time which is great news and in general I do believe that we are making headway towards complete closure which is good news. No fevers, chills, nausea, vomiting, or diarrhea. 07-05-2023 upon evaluation today patient appears to be doing well currently in regard to his heel ulcer. This is actually very close I think that being completely healed. This is great news and overall very pleased with where we stand today. 07-12-2023 upon evaluation today patient appears to be doing well currently in regard to his wound. He in fact appears to be very close to complete resolution I am very pleased that regard. However on the tip of his foot there appears to be some issue here with inflammation secondary to infection. I am very concerned about the infection at the site. Electronic Signature(s) Signed: 07/12/2023 2:41:27 PM By: [name] Derry  PA-C Entered By: [name] Derry on 07/12/2023 14:41:26 -------------------------------------------------------------------------------- Physical Exam Details Patient Name: Date of Service: [name], [name] 07/12/2023 1:45 PM Medical Record Number: [id number] Patient Account Number: [id number] Date of Birth/Sex: Treating RN: [birth year] (77 y.o. [name] Primary Care Provider: [name] Other Clinician: Betha Loa Referring Provider: Treating Provider/Extender: Hermine Messick Weeks in Treatment: 35 Constitutional Well-nourished and well-hydrated in no acute distress. Respiratory normal breathing without difficulty. Psychiatric this patient is able to make decisions and demonstrates good insight into disease process. Alert and Oriented x 3. pleasant and cooperative. Notes Upon inspection patient's wound bed actually showed signs of good granulation epithelization at this point. Fortunately I do not see any signs of infection locally or systemically anywhere except for the distal portion of his foot this is nowhere unrelated shin to the wound but does appear to be of concern. I am going to get him started on antibiotic here today as well. Electronic Signature(s) Signed: 07/12/2023 2:41:50 PM By: [name] Derry PA-C Entered By: [name] Derry on 07/12/2023 14:41:50 Barrie Folk ([id number]) [id number] Page 6 of 13 -------------------------------------------------------------------------------- Physician Orders Details Patient Name: Date of Service: [name] J. 07/12/2023 1:45 PM Medical Record Number: [id number] Patient Account Number: [id number] Date of Birth/Sex: Treating RN: April 29, 1946 (77 y.o. 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 Bathing/ Shower/ Hygiene May shower; gently cleanse wound with antibacterial soap, rinse and  pat dry prior to dressing wounds Off-Loading Open toe surgical shoe Medications-Please add to medication list. ntibiotics - start Bactrim DS P.O. A Wound Treatment Wound #10 - Calcaneus Wound Laterality: Left Topical: Mupirocin Ointment 1 x Per Week/30 Days Discharge Instructions: Apply as directed by provider. Prim Dressing: Hydrofera Blue Ready Transfer Foam, 2.5x2.5 (in/in) (Dispense As Written) 1 x Per Week/30 Days ary Discharge Instructions: Apply Hydrofera Blue Ready to wound bed as directed Secondary Dressing: ABD Pad 5x9 (in/in) 1 x Per Week/30 Days Discharge Instructions: Cover with ABD pad Compression Wrap: Urgo K2 Lite, two layer compression system, regular 1 x Per Week/30 Days Patient Medications llergies: tetracycline A Notifications Medication Indication Start End 07/12/2023 Bactrim DS DOSE 1 - oral 800 mg-160 mg tablet - 1 tablet oral twice a day x 14 days Electronic Signature(s) Signed: 07/12/2023 2:44:17 PM By: [name] Derry PA-C Entered By: [name] Derry on 07/12/2023 14:44:17 Barrie Folk ([id number]) [id number] Page 7 of 13 -------------------------------------------------------------------------------- Problem List Details Patient Name: Date of Service: [name], [name] 07/12/2023 1:45 PM Medical Record Number: [id number] Patient Account Number: [id number] Date of Birth/Sex: Treating RN: 10/10/1946 (77 y.o. [name]) [name] Primary Care Provider: [name] Other  Clinician: Betha Loa Referring Provider: Treating Provider/Extender: [name] in Treatment: 75 Active Problems ICD-10 Encounter Code Description Active Date MDM Diagnosis E11.621 Type 2 diabetes mellitus with foot ulcer 06/19/2022 No Yes 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sed 06/19/2022 No Yes E11.42 Type 2 diabetes mellitus with diabetic polyneuropathy 06/19/2022 No Yes Inactive Problems Resolved Problems Electronic Signature(s) Signed: 07/12/2023 1:57:42 PM By: [name] Derry PA-C Entered By: [name] Derry on 07/12/2023 13:57:42 -------------------------------------------------------------------------------- Progress Note Details Patient Name: Date of Service: [name], [name] 07/12/2023 1:45 PM Medical Record Number: [id number] Patient Account Number: [id number] Date of Birth/Sex: Treating RN: 02-07-1946 (77 y.o. [name]) [name] Primary Care Provider: [name] Other Clinician: Betha Loa Referring Provider: Treating Provider/Extender: [name] in Treatment: 55 Subjective Chief Complaint Information obtained from Patient [name], [name] ([id number]) [id number] Page 8 of 13 Left heel ulcer History of Present Illness (HPI) 09/24/2020 on evaluation today patient presents today for a heel ulcer that he tells me has been present for about 2 years. He has been seeing podiatry and they have been attempting to manage this including what sounds to be a total contact cast, Unna boot, and just standard dressings otherwise as well. Most recently has been using triple antibiotic ointment. With that being said unfortunately despite everything he really has not had any significant improvement. He tells me that he cannot even really remember exactly how this began but he presumed it may have rubbed on his shoes or something of that nature. 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 He tells me that this does seem to help. Fortunately there is no signs of active infection at this time. His most recent hemoglobin A1c was 8.1 though he knows this was this year he cannot tell me the exact time. His fluid pills currently to help with some of the lower extremity edema although he does obviously have signs of venous stasis/lymphedema. Currently there  diabetic. We have been using Hydrofera Blue under Urgo K2 lite wrap he has been in his surgical shoe although he tells me it disintegrated and he came in on a regular running shoe today. 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 He is not wearing compression stocking 06-22-2023 upon evaluation today patient appears to be doing excellent in regard to his wound on the heel which is actually significantly smaller compared to last week's evaluation. Fortunately I do not see any signs of active infection locally or systemically at this time. 06-28-2023 upon evaluation today patient appears to be doing well currently in regard to his heel which is actually showing signs of improvement. Fortunately I do not see any evidence of active infection at this time which is great news and in general I do believe that we are making headway towards complete closure which is good news. No fevers, chills, nausea, vomiting, or diarrhea. 07-05-2023 upon evaluation today patient appears to be doing well currently in regard to his heel ulcer. This is actually very close I think that being completely healed. This is great news and overall very pleased with where we stand today. 07-12-2023 upon evaluation today patient appears to be doing well currently in regard to his wound. He in fact appears to be very close to complete resolution I am very pleased that regard. However on the tip of his foot there appears to be some issue here with inflammation secondary to infection. I am very concerned about the infection at the site. Electronic Signature(s) Signed: 07/12/2023 2:41:27 PM By: [name] Derry  PA-C Entered By: [name] Derry on 07/12/2023 14:41:26 -------------------------------------------------------------------------------- Physical Exam Details Patient Name: Date of Service: [name], [name] 07/12/2023 1:45 PM Medical Record Number: [id number] Patient Account Number: [id number] Date of Birth/Sex: Treating RN: [birth year] (77 y.o. [name] Primary Care Provider: [name] Other Clinician: Betha Loa Referring Provider: Treating Provider/Extender: Hermine Messick Weeks in Treatment: 35 Constitutional Well-nourished and well-hydrated in no acute distress. Respiratory normal breathing without difficulty. Psychiatric this patient is able to make decisions and demonstrates good insight into disease process. Alert and Oriented x 3. pleasant and cooperative. Notes Upon inspection patient's wound bed actually showed signs of good granulation epithelization at this point. Fortunately I do not see any signs of infection locally or systemically anywhere except for the distal portion of his foot this is nowhere unrelated shin to the wound but does appear to be of concern. I am going to get him started on antibiotic here today as well. Electronic Signature(s) Signed: 07/12/2023 2:41:50 PM By: [name] Derry PA-C Entered By: [name] Derry on 07/12/2023 14:41:50 Barrie Folk ([id number]) [id number] Page 6 of 13 -------------------------------------------------------------------------------- Physician Orders Details Patient Name: Date of Service: [name] J. 07/12/2023 1:45 PM Medical Record Number: [id number] Patient Account Number: [id number] Date of Birth/Sex: Treating RN: April 29, 1946 (77 y.o. 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 Bathing/ Shower/ Hygiene May shower; gently cleanse wound with antibacterial soap, rinse and  diabetic. We have been using Hydrofera Blue under Urgo K2 lite wrap he has been in his surgical shoe although he tells me it disintegrated and he came in on a regular running shoe today. 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 He is not wearing compression stocking 06-22-2023 upon evaluation today patient appears to be doing excellent in regard to his wound on the heel which is actually significantly smaller compared to last week's evaluation. Fortunately I do not see any signs of active infection locally or systemically at this time. 06-28-2023 upon evaluation today patient appears to be doing well currently in regard to his heel which is actually showing signs of improvement. Fortunately I do not see any evidence of active infection at this time which is great news and in general I do believe that we are making headway towards complete closure which is good news. No fevers, chills, nausea, vomiting, or diarrhea. 07-05-2023 upon evaluation today patient appears to be doing well currently in regard to his heel ulcer. This is actually very close I think that being completely healed. This is great news and overall very pleased with where we stand today. 07-12-2023 upon evaluation today patient appears to be doing well currently in regard to his wound. He in fact appears to be very close to complete resolution I am very pleased that regard. However on the tip of his foot there appears to be some issue here with inflammation secondary to infection. I am very concerned about the infection at the site. Electronic Signature(s) Signed: 07/12/2023 2:41:27 PM By: [name] Derry  PA-C Entered By: [name] Derry on 07/12/2023 14:41:26 -------------------------------------------------------------------------------- Physical Exam Details Patient Name: Date of Service: [name], [name] 07/12/2023 1:45 PM Medical Record Number: [id number] Patient Account Number: [id number] Date of Birth/Sex: Treating RN: [birth year] (77 y.o. [name] Primary Care Provider: [name] Other Clinician: Betha Loa Referring Provider: Treating Provider/Extender: Hermine Messick Weeks in Treatment: 35 Constitutional Well-nourished and well-hydrated in no acute distress. Respiratory normal breathing without difficulty. Psychiatric this patient is able to make decisions and demonstrates good insight into disease process. Alert and Oriented x 3. pleasant and cooperative. Notes Upon inspection patient's wound bed actually showed signs of good granulation epithelization at this point. Fortunately I do not see any signs of infection locally or systemically anywhere except for the distal portion of his foot this is nowhere unrelated shin to the wound but does appear to be of concern. I am going to get him started on antibiotic here today as well. Electronic Signature(s) Signed: 07/12/2023 2:41:50 PM By: [name] Derry PA-C Entered By: [name] Derry on 07/12/2023 14:41:50 Barrie Folk ([id number]) [id number] Page 6 of 13 -------------------------------------------------------------------------------- Physician Orders Details Patient Name: Date of Service: [name] J. 07/12/2023 1:45 PM Medical Record Number: [id number] Patient Account Number: [id number] Date of Birth/Sex: Treating RN: April 29, 1946 (77 y.o. 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 Bathing/ Shower/ Hygiene May shower; gently cleanse wound with antibacterial soap, rinse and  pat dry prior to dressing wounds Off-Loading Open toe surgical shoe Medications-Please add to medication list. ntibiotics - start Bactrim DS P.O. A Wound Treatment Wound #10 - Calcaneus Wound Laterality: Left Topical: Mupirocin Ointment 1 x Per Week/30 Days Discharge Instructions: Apply as directed by provider. Prim Dressing: Hydrofera Blue Ready Transfer Foam, 2.5x2.5 (in/in) (Dispense As Written) 1 x Per Week/30 Days ary Discharge Instructions: Apply Hydrofera Blue Ready to wound bed as directed Secondary Dressing: ABD Pad 5x9 (in/in) 1 x Per Week/30 Days Discharge Instructions: Cover with ABD pad Compression Wrap: Urgo K2 Lite, two layer compression system, regular 1 x Per Week/30 Days Patient Medications llergies: tetracycline A Notifications Medication Indication Start End 07/12/2023 Bactrim DS DOSE 1 - oral 800 mg-160 mg tablet - 1 tablet oral twice a day x 14 days Electronic Signature(s) Signed: 07/12/2023 2:44:17 PM By: [name] Derry PA-C Entered By: [name] Derry on 07/12/2023 14:44:17 Barrie Folk ([id number]) [id number] Page 7 of 13 -------------------------------------------------------------------------------- Problem List Details Patient Name: Date of Service: [name], [name] 07/12/2023 1:45 PM Medical Record Number: [id number] Patient Account Number: [id number] Date of Birth/Sex: Treating RN: 10/10/1946 (77 y.o. [name]) [name] Primary Care Provider: [name] Other  Clinician: Betha Loa Referring Provider: Treating Provider/Extender: [name] in Treatment: 75 Active Problems ICD-10 Encounter Code Description Active Date MDM Diagnosis E11.621 Type 2 diabetes mellitus with foot ulcer 06/19/2022 No Yes 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sed 06/19/2022 No Yes E11.42 Type 2 diabetes mellitus with diabetic polyneuropathy 06/19/2022 No Yes Inactive Problems Resolved Problems Electronic Signature(s) Signed: 07/12/2023 1:57:42 PM By: [name] Derry PA-C Entered By: [name] Derry on 07/12/2023 13:57:42 -------------------------------------------------------------------------------- Progress Note Details Patient Name: Date of Service: [name], [name] 07/12/2023 1:45 PM Medical Record Number: [id number] Patient Account Number: [id number] Date of Birth/Sex: Treating RN: 02-07-1946 (77 y.o. [name]) [name] Primary Care Provider: [name] Other Clinician: Betha Loa Referring Provider: Treating Provider/Extender: [name] in Treatment: 55 Subjective Chief Complaint Information obtained from Patient [name], [name] ([id number]) [id number] Page 8 of 13 Left heel ulcer History of Present Illness (HPI) 09/24/2020 on evaluation today patient presents today for a heel ulcer that he tells me has been present for about 2 years. He has been seeing podiatry and they have been attempting to manage this including what sounds to be a total contact cast, Unna boot, and just standard dressings otherwise as well. Most recently has been using triple antibiotic ointment. With that being said unfortunately despite everything he really has not had any significant improvement. He tells me that he cannot even really remember exactly how this began but he presumed it may have rubbed on his shoes or something of that nature. 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 He tells me that this does seem to help. Fortunately there is no signs of active infection at this time. His most recent hemoglobin A1c was 8.1 though he knows this was this year he cannot tell me the exact time. His fluid pills currently to help with some of the lower extremity edema although he does obviously have signs of venous stasis/lymphedema. Currently there  pat dry prior to dressing wounds Off-Loading Open toe surgical shoe Medications-Please add to medication list. ntibiotics - start Bactrim DS P.O. A Wound Treatment Wound #10 - Calcaneus Wound Laterality: Left Topical: Mupirocin Ointment 1 x Per Week/30 Days Discharge Instructions: Apply as directed by provider. Prim Dressing: Hydrofera Blue Ready Transfer Foam, 2.5x2.5 (in/in) (Dispense As Written) 1 x Per Week/30 Days ary Discharge Instructions: Apply Hydrofera Blue Ready to wound bed as directed Secondary Dressing: ABD Pad 5x9 (in/in) 1 x Per Week/30 Days Discharge Instructions: Cover with ABD pad Compression Wrap: Urgo K2 Lite, two layer compression system, regular 1 x Per Week/30 Days Patient Medications llergies: tetracycline A Notifications Medication Indication Start End 07/12/2023 Bactrim DS DOSE 1 - oral 800 mg-160 mg tablet - 1 tablet oral twice a day x 14 days Electronic Signature(s) Signed: 07/12/2023 2:44:17 PM By: [name] Derry PA-C Entered By: [name] Derry on 07/12/2023 14:44:17 Barrie Folk ([id number]) [id number] Page 7 of 13 -------------------------------------------------------------------------------- Problem List Details Patient Name: Date of Service: [name], [name] 07/12/2023 1:45 PM Medical Record Number: [id number] Patient Account Number: [id number] Date of Birth/Sex: Treating RN: 10/10/1946 (77 y.o. [name]) [name] Primary Care Provider: [name] Other  Clinician: Betha Loa Referring Provider: Treating Provider/Extender: [name] in Treatment: 75 Active Problems ICD-10 Encounter Code Description Active Date MDM Diagnosis E11.621 Type 2 diabetes mellitus with foot ulcer 06/19/2022 No Yes 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sed 06/19/2022 No Yes E11.42 Type 2 diabetes mellitus with diabetic polyneuropathy 06/19/2022 No Yes Inactive Problems Resolved Problems Electronic Signature(s) Signed: 07/12/2023 1:57:42 PM By: [name] Derry PA-C Entered By: [name] Derry on 07/12/2023 13:57:42 -------------------------------------------------------------------------------- Progress Note Details Patient Name: Date of Service: [name], [name] 07/12/2023 1:45 PM Medical Record Number: [id number] Patient Account Number: [id number] Date of Birth/Sex: Treating RN: 02-07-1946 (77 y.o. [name]) [name] Primary Care Provider: [name] Other Clinician: Betha Loa Referring Provider: Treating Provider/Extender: [name] in Treatment: 55 Subjective Chief Complaint Information obtained from Patient [name], [name] ([id number]) [id number] Page 8 of 13 Left heel ulcer History of Present Illness (HPI) 09/24/2020 on evaluation today patient presents today for a heel ulcer that he tells me has been present for about 2 years. He has been seeing podiatry and they have been attempting to manage this including what sounds to be a total contact cast, Unna boot, and just standard dressings otherwise as well. Most recently has been using triple antibiotic ointment. With that being said unfortunately despite everything he really has not had any significant improvement. He tells me that he cannot even really remember exactly how this began but he presumed it may have rubbed on his shoes or something of that nature. 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 He tells me that this does seem to help. Fortunately there is no signs of active infection at this time. His most recent hemoglobin A1c was 8.1 though he knows this was this year he cannot tell me the exact time. His fluid pills currently to help with some of the lower extremity edema although he does obviously have signs of venous stasis/lymphedema. Currently there  pat dry prior to dressing wounds Off-Loading Open toe surgical shoe Medications-Please add to medication list. ntibiotics - start Bactrim DS P.O. A Wound Treatment Wound #10 - Calcaneus Wound Laterality: Left Topical: Mupirocin Ointment 1 x Per Week/30 Days Discharge Instructions: Apply as directed by provider. Prim Dressing: Hydrofera Blue Ready Transfer Foam, 2.5x2.5 (in/in) (Dispense As Written) 1 x Per Week/30 Days ary Discharge Instructions: Apply Hydrofera Blue Ready to wound bed as directed Secondary Dressing: ABD Pad 5x9 (in/in) 1 x Per Week/30 Days Discharge Instructions: Cover with ABD pad Compression Wrap: Urgo K2 Lite, two layer compression system, regular 1 x Per Week/30 Days Patient Medications llergies: tetracycline A Notifications Medication Indication Start End 07/12/2023 Bactrim DS DOSE 1 - oral 800 mg-160 mg tablet - 1 tablet oral twice a day x 14 days Electronic Signature(s) Signed: 07/12/2023 2:44:17 PM By: [name] Derry PA-C Entered By: [name] Derry on 07/12/2023 14:44:17 Barrie Folk ([id number]) [id number] Page 7 of 13 -------------------------------------------------------------------------------- Problem List Details Patient Name: Date of Service: [name], [name] 07/12/2023 1:45 PM Medical Record Number: [id number] Patient Account Number: [id number] Date of Birth/Sex: Treating RN: 10/10/1946 (77 y.o. [name]) [name] Primary Care Provider: [name] Other  Clinician: Betha Loa Referring Provider: Treating Provider/Extender: [name] in Treatment: 75 Active Problems ICD-10 Encounter Code Description Active Date MDM Diagnosis E11.621 Type 2 diabetes mellitus with foot ulcer 06/19/2022 No Yes 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sed 06/19/2022 No Yes E11.42 Type 2 diabetes mellitus with diabetic polyneuropathy 06/19/2022 No Yes Inactive Problems Resolved Problems Electronic Signature(s) Signed: 07/12/2023 1:57:42 PM By: [name] Derry PA-C Entered By: [name] Derry on 07/12/2023 13:57:42 -------------------------------------------------------------------------------- Progress Note Details Patient Name: Date of Service: [name], [name] 07/12/2023 1:45 PM Medical Record Number: [id number] Patient Account Number: [id number] Date of Birth/Sex: Treating RN: 02-07-1946 (77 y.o. [name]) [name] Primary Care Provider: [name] Other Clinician: Betha Loa Referring Provider: Treating Provider/Extender: [name] in Treatment: 55 Subjective Chief Complaint Information obtained from Patient [name], [name] ([id number]) [id number] Page 8 of 13 Left heel ulcer History of Present Illness (HPI) 09/24/2020 on evaluation today patient presents today for a heel ulcer that he tells me has been present for about 2 years. He has been seeing podiatry and they have been attempting to manage this including what sounds to be a total contact cast, Unna boot, and just standard dressings otherwise as well. Most recently has been using triple antibiotic ointment. With that being said unfortunately despite everything he really has not had any significant improvement. He tells me that he cannot even really remember exactly how this began but he presumed it may have rubbed on his shoes or something of that nature. 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 He tells me that this does seem to help. Fortunately there is no signs of active infection at this time. His most recent hemoglobin A1c was 8.1 though he knows this was this year he cannot tell me the exact time. His fluid pills currently to help with some of the lower extremity edema although he does obviously have signs of venous stasis/lymphedema. Currently there  pat dry prior to dressing wounds Off-Loading Open toe surgical shoe Medications-Please add to medication list. ntibiotics - start Bactrim DS P.O. A Wound Treatment Wound #10 - Calcaneus Wound Laterality: Left Topical: Mupirocin Ointment 1 x Per Week/30 Days Discharge Instructions: Apply as directed by provider. Prim Dressing: Hydrofera Blue Ready Transfer Foam, 2.5x2.5 (in/in) (Dispense As Written) 1 x Per Week/30 Days ary Discharge Instructions: Apply Hydrofera Blue Ready to wound bed as directed Secondary Dressing: ABD Pad 5x9 (in/in) 1 x Per Week/30 Days Discharge Instructions: Cover with ABD pad Compression Wrap: Urgo K2 Lite, two layer compression system, regular 1 x Per Week/30 Days Patient Medications llergies: tetracycline A Notifications Medication Indication Start End 07/12/2023 Bactrim DS DOSE 1 - oral 800 mg-160 mg tablet - 1 tablet oral twice a day x 14 days Electronic Signature(s) Signed: 07/12/2023 2:44:17 PM By: [name] Derry PA-C Entered By: [name] Derry on 07/12/2023 14:44:17 Barrie Folk ([id number]) [id number] Page 7 of 13 -------------------------------------------------------------------------------- Problem List Details Patient Name: Date of Service: [name], [name] 07/12/2023 1:45 PM Medical Record Number: [id number] Patient Account Number: [id number] Date of Birth/Sex: Treating RN: 10/10/1946 (77 y.o. [name]) [name] Primary Care Provider: [name] Other  Clinician: Betha Loa Referring Provider: Treating Provider/Extender: [name] in Treatment: 75 Active Problems ICD-10 Encounter Code Description Active Date MDM Diagnosis E11.621 Type 2 diabetes mellitus with foot ulcer 06/19/2022 No Yes L97.522 Non-pressure chronic ulcer of other part of left foot with fat layer exposed 06/19/2022 No Yes E11.42 Type 2 diabetes mellitus with diabetic polyneuropathy 06/19/2022 No Yes Inactive Problems Resolved Problems Electronic Signature(s) Signed: 07/12/2023 1:57:42 PM By: [name] Derry PA-C Entered By: [name] Derry on 07/12/2023 13:57:42 -------------------------------------------------------------------------------- Progress Note Details Patient Name: Date of Service: [name], [name] 07/12/2023 1:45 PM Medical Record Number: [id number] Patient Account Number: [id number] Date of Birth/Sex: Treating RN: 02-07-1946 (77 y.o. [name]) [name] Primary Care Provider: [name] Other Clinician: Betha Loa Referring Provider: Treating Provider/Extender: [name] in Treatment: 55 Subjective Chief Complaint Information obtained from Patient [name], [name] ([id number]) [id number] Page 8 of 13 Left heel ulcer History of Present Illness (HPI) 09/24/2020 on evaluation today patient presents today for a heel ulcer that he tells me has been present for about 2 years. He has been seeing podiatry and they have been attempting to manage this including what sounds to be a total contact cast, Unna boot, and just standard dressings otherwise as well. Most recently has been using triple antibiotic ointment. With that being said unfortunately despite everything he really has not had any significant improvement. He tells me that he cannot even really remember exactly how this began but he presumed it may have rubbed on his shoes or something of that nature. 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 He tells me that this does seem to help. Fortunately there is no signs of active infection at this time. His most recent hemoglobin A1c was 8.1 though he knows this was this year he cannot tell me the exact time. His fluid pills currently to help with some of the lower extremity edema although he does obviously have signs of venous stasis/lymphedema. Currently there  diabetic. We have been using Hydrofera Blue under Urgo K2 lite wrap he has been in his surgical shoe although he tells me it disintegrated and he came in on a regular running shoe today. 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 He is not wearing compression stocking 06-22-2023 upon evaluation today patient appears to be doing excellent in regard to his wound on the heel which is actually significantly smaller compared to last week's evaluation. Fortunately I do not see any signs of active infection locally or systemically at this time. 06-28-2023 upon evaluation today patient appears to be doing well currently in regard to his heel which is actually showing signs of improvement. Fortunately I do not see any evidence of active infection at this time which is great news and in general I do believe that we are making headway towards complete closure which is good news. No fevers, chills, nausea, vomiting, or diarrhea. 07-05-2023 upon evaluation today patient appears to be doing well currently in regard to his heel ulcer. This is actually very close I think that being completely healed. This is great news and overall very pleased with where we stand today. 07-12-2023 upon evaluation today patient appears to be doing well currently in regard to his wound. He in fact appears to be very close to complete resolution I am very pleased that regard. However on the tip of his foot there appears to be some issue here with inflammation secondary to infection. I am very concerned about the infection at the site. Electronic Signature(s) Signed: 07/12/2023 2:41:27 PM By: [name] Derry  PA-C Entered By: [name] Derry on 07/12/2023 14:41:26 -------------------------------------------------------------------------------- Physical Exam Details Patient Name: Date of Service: [name], [name] 07/12/2023 1:45 PM Medical Record Number: [id number] Patient Account Number: [id number] Date of Birth/Sex: Treating RN: [birth year] (77 y.o. [name] Primary Care Provider: [name] Other Clinician: Betha Loa Referring Provider: Treating Provider/Extender: Hermine Messick Weeks in Treatment: 35 Constitutional Well-nourished and well-hydrated in no acute distress. Respiratory normal breathing without difficulty. Psychiatric this patient is able to make decisions and demonstrates good insight into disease process. Alert and Oriented x 3. pleasant and cooperative. Notes Upon inspection patient's wound bed actually showed signs of good granulation epithelization at this point. Fortunately I do not see any signs of infection locally or systemically anywhere except for the distal portion of his foot this is nowhere unrelated shin to the wound but does appear to be of concern. I am going to get him started on antibiotic here today as well. Electronic Signature(s) Signed: 07/12/2023 2:41:50 PM By: [name] Derry PA-C Entered By: [name] Derry on 07/12/2023 14:41:50 Barrie Folk ([id number]) [id number] Page 6 of 13 -------------------------------------------------------------------------------- Physician Orders Details Patient Name: Date of Service: [name] J. 07/12/2023 1:45 PM Medical Record Number: [id number] Patient Account Number: [id number] Date of Birth/Sex: Treating RN: April 29, 1946 (77 y.o. 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 Bathing/ Shower/ Hygiene May shower; gently cleanse wound with antibacterial soap, rinse and  diabetic. We have been using Hydrofera Blue under Urgo K2 lite wrap he has been in his surgical shoe although he tells me it disintegrated and he came in on a regular running shoe today. 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 He is not wearing compression stocking 06-22-2023 upon evaluation today patient appears to be doing excellent in regard to his wound on the heel which is actually significantly smaller compared to last week's evaluation. Fortunately I do not see any signs of active infection locally or systemically at this time. 06-28-2023 upon evaluation today patient appears to be doing well currently in regard to his heel which is actually showing signs of improvement. Fortunately I do not see any evidence of active infection at this time which is great news and in general I do believe that we are making headway towards complete closure which is good news. No fevers, chills, nausea, vomiting, or diarrhea. 07-05-2023 upon evaluation today patient appears to be doing well currently in regard to his heel ulcer. This is actually very close I think that being completely healed. This is great news and overall very pleased with where we stand today. 07-12-2023 upon evaluation today patient appears to be doing well currently in regard to his wound. He in fact appears to be very close to complete resolution I am very pleased that regard. However on the tip of his foot there appears to be some issue here with inflammation secondary to infection. I am very concerned about the infection at the site. Electronic Signature(s) Signed: 07/12/2023 2:41:27 PM By: [name] Derry  PA-C Entered By: [name] Derry on 07/12/2023 14:41:26 -------------------------------------------------------------------------------- Physical Exam Details Patient Name: Date of Service: [name], [name] 07/12/2023 1:45 PM Medical Record Number: [id number] Patient Account Number: [id number] Date of Birth/Sex: Treating RN: [birth year] (77 y.o. [name] Primary Care Provider: [name] Other Clinician: Betha Loa Referring Provider: Treating Provider/Extender: Hermine Messick Weeks in Treatment: 35 Constitutional Well-nourished and well-hydrated in no acute distress. Respiratory normal breathing without difficulty. Psychiatric this patient is able to make decisions and demonstrates good insight into disease process. Alert and Oriented x 3. pleasant and cooperative. Notes Upon inspection patient's wound bed actually showed signs of good granulation epithelization at this point. Fortunately I do not see any signs of infection locally or systemically anywhere except for the distal portion of his foot this is nowhere unrelated shin to the wound but does appear to be of concern. I am going to get him started on antibiotic here today as well. Electronic Signature(s) Signed: 07/12/2023 2:41:50 PM By: [name] Derry PA-C Entered By: [name] Derry on 07/12/2023 14:41:50 Barrie Folk ([id number]) [id number] Page 6 of 13 -------------------------------------------------------------------------------- Physician Orders Details Patient Name: Date of Service: [name] J. 07/12/2023 1:45 PM Medical Record Number: [id number] Patient Account Number: [id number] Date of Birth/Sex: Treating RN: April 29, 1946 (77 y.o. 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 Bathing/ Shower/ Hygiene May shower; gently cleanse wound with antibacterial soap, rinse and  pat dry prior to dressing wounds Off-Loading Open toe surgical shoe Medications-Please add to medication list. ntibiotics - start Bactrim DS P.O. A Wound Treatment Wound #10 - Calcaneus Wound Laterality: Left Topical: Mupirocin Ointment 1 x Per Week/30 Days Discharge Instructions: Apply as directed by provider. Prim Dressing: Hydrofera Blue Ready Transfer Foam, 2.5x2.5 (in/in) (Dispense As Written) 1 x Per Week/30 Days ary Discharge Instructions: Apply Hydrofera Blue Ready to wound bed as directed Secondary Dressing: ABD Pad 5x9 (in/in) 1 x Per Week/30 Days Discharge Instructions: Cover with ABD pad Compression Wrap: Urgo K2 Lite, two layer compression system, regular 1 x Per Week/30 Days Patient Medications llergies: tetracycline A Notifications Medication Indication Start End 07/12/2023 Bactrim DS DOSE 1 - oral 800 mg-160 mg tablet - 1 tablet oral twice a day x 14 days Electronic Signature(s) Signed: 07/12/2023 2:44:17 PM By: [name] Derry PA-C Entered By: [name] Derry on 07/12/2023 14:44:17 Barrie Folk ([id number]) [id number] Page 7 of 13 -------------------------------------------------------------------------------- Problem List Details Patient Name: Date of Service: [name], [name] 07/12/2023 1:45 PM Medical Record Number: [id number] Patient Account Number: [id number] Date of Birth/Sex: Treating RN: 10/10/1946 (77 y.o. [name]) [name] Primary Care Provider: [name] Other  Clinician: Betha Loa Referring Provider: Treating Provider/Extender: [name] in Treatment: 75 Active Problems ICD-10 Encounter Code Description Active Date MDM Diagnosis E11.621 Type 2 diabetes mellitus with foot ulcer 06/19/2022 No Yes L97.522 Non-pressure chronic ulcer of other part of left foot with fat layer exposed 06/19/2022 No Yes E11.42 Type 2 diabetes mellitus with diabetic polyneuropathy 06/19/2022 No Yes Inactive Problems Resolved Problems Electronic Signature(s) Signed: 07/12/2023 1:57:42 PM By: [name] Derry PA-C Entered By: [name] Derry on 07/12/2023 13:57:42 -------------------------------------------------------------------------------- Progress Note Details Patient Name: Date of Service: [name], [name] 07/12/2023 1:45 PM Medical Record Number: [id number] Patient Account Number: [id number] Date of Birth/Sex: Treating RN: 02-07-1946 (77 y.o. [name]) [name] Primary Care Provider: [name] Other Clinician: Betha Loa Referring Provider: Treating Provider/Extender: [name] in Treatment: 55 Subjective Chief Complaint Information obtained from Patient [name], [name] ([id number]) [id number] Page 8 of 13 Left heel ulcer History of Present Illness (HPI) 09/24/2020 on evaluation today patient presents today for a heel ulcer that he tells me has been present for about 2 years. He has been seeing podiatry and they have been attempting to manage this including what sounds to be a total contact cast, Unna boot, and just standard dressings otherwise as well. Most recently has been using triple antibiotic ointment. With that being said unfortunately despite everything he really has not had any significant improvement. He tells me that he cannot even really remember exactly how this began but he presumed it may have rubbed on his shoes or something of that nature. 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 He tells me that this does seem to help. Fortunately there is no signs of active infection at this time. His most recent hemoglobin A1c was 8.1 though he knows this was this year he cannot tell me the exact time. His fluid pills currently to help with some of the lower extremity edema although he does obviously have signs of venous stasis/lymphedema. Currently there  pat dry prior to dressing wounds Off-Loading Open toe surgical shoe Medications-Please add to medication list. ntibiotics - start Bactrim DS P.O. A Wound Treatment Wound #10 - Calcaneus Wound Laterality: Left Topical: Mupirocin Ointment 1 x Per Week/30 Days Discharge Instructions: Apply as directed by provider. Prim Dressing: Hydrofera Blue Ready Transfer Foam, 2.5x2.5 (in/in) (Dispense As Written) 1 x Per Week/30 Days ary Discharge Instructions: Apply Hydrofera Blue Ready to wound bed as directed Secondary Dressing: ABD Pad 5x9 (in/in) 1 x Per Week/30 Days Discharge Instructions: Cover with ABD pad Compression Wrap: Urgo K2 Lite, two layer compression system, regular 1 x Per Week/30 Days Patient Medications llergies: tetracycline A Notifications Medication Indication Start End 07/12/2023 Bactrim DS DOSE 1 - oral 800 mg-160 mg tablet - 1 tablet oral twice a day x 14 days Electronic Signature(s) Signed: 07/12/2023 2:44:17 PM By: [name] Derry PA-C Entered By: [name] Derry on 07/12/2023 14:44:17 Barrie Folk ([id number]) [id number] Page 7 of 13 -------------------------------------------------------------------------------- Problem List Details Patient Name: Date of Service: [name], [name] 07/12/2023 1:45 PM Medical Record Number: [id number] Patient Account Number: [id number] Date of Birth/Sex: Treating RN: 10/10/1946 (77 y.o. [name]) [name] Primary Care Provider: [name] Other  Clinician: Betha Loa Referring Provider: Treating Provider/Extender: [name] in Treatment: 75 Active Problems ICD-10 Encounter Code Description Active Date MDM Diagnosis E11.621 Type 2 diabetes mellitus with foot ulcer 06/19/2022 No Yes L97.522 Non-pressure chronic ulcer of other part of left foot with fat layer exposed 06/19/2022 No Yes E11.42 Type 2 diabetes mellitus with diabetic polyneuropathy 06/19/2022 No Yes Inactive Problems Resolved Problems Electronic Signature(s) Signed: 07/12/2023 1:57:42 PM By: [name] Derry PA-C Entered By: [name] Derry on 07/12/2023 13:57:42 -------------------------------------------------------------------------------- Progress Note Details Patient Name: Date of Service: [name], [name] 07/12/2023 1:45 PM Medical Record Number: [id number] Patient Account Number: [id number] Date of Birth/Sex: Treating RN: 02-07-1946 (77 y.o. [name]) [name] Primary Care Provider: [name] Other Clinician: Betha Loa Referring Provider: Treating Provider/Extender: [name] in Treatment: 55 Subjective Chief Complaint Information obtained from Patient [name], [name] ([id number]) [id number] Page 8 of 13 Left heel ulcer History of Present Illness (HPI) 09/24/2020 on evaluation today patient presents today for a heel ulcer that he tells me has been present for about 2 years. He has been seeing podiatry and they have been attempting to manage this including what sounds to be a total contact cast, Unna boot, and just standard dressings otherwise as well. Most recently has been using triple antibiotic ointment. With that being said unfortunately despite everything he really has not had any significant improvement. He tells me that he cannot even really remember exactly how this began but he presumed it may have rubbed on his shoes or something of that nature. 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 He tells me that this does seem to help. Fortunately there is no signs of active infection at this time. His most recent hemoglobin A1c was 8.1 though he knows this was this year he cannot tell me the exact time. His fluid pills currently to help with some of the lower extremity edema although he does obviously have signs of venous stasis/lymphedema. Currently there  pat dry prior to dressing wounds Off-Loading Open toe surgical shoe Medications-Please add to medication list. ntibiotics - start Bactrim DS P.O. A Wound Treatment Wound #10 - Calcaneus Wound Laterality: Left Topical: Mupirocin Ointment 1 x Per Week/30 Days Discharge Instructions: Apply as directed by provider. Prim Dressing: Hydrofera Blue Ready Transfer Foam, 2.5x2.5 (in/in) (Dispense As Written) 1 x Per Week/30 Days ary Discharge Instructions: Apply Hydrofera Blue Ready to wound bed as directed Secondary Dressing: ABD Pad 5x9 (in/in) 1 x Per Week/30 Days Discharge Instructions: Cover with ABD pad Compression Wrap: Urgo K2 Lite, two layer compression system, regular 1 x Per Week/30 Days Patient Medications llergies: tetracycline A Notifications Medication Indication Start End 07/12/2023 Bactrim DS DOSE 1 - oral 800 mg-160 mg tablet - 1 tablet oral twice a day x 14 days Electronic Signature(s) Signed: 07/12/2023 2:44:17 PM By: [name] Derry PA-C Entered By: [name] Derry on 07/12/2023 14:44:17 Barrie Folk ([id number]) [id number] Page 7 of 13 -------------------------------------------------------------------------------- Problem List Details Patient Name: Date of Service: [name], [name] 07/12/2023 1:45 PM Medical Record Number: [id number] Patient Account Number: [id number] Date of Birth/Sex: Treating RN: 10/10/1946 (77 y.o. [name]) [name] Primary Care Provider: [name] Other  Clinician: Betha Loa Referring Provider: Treating Provider/Extender: [name] in Treatment: 75 Active Problems ICD-10 Encounter Code Description Active Date MDM Diagnosis E11.621 Type 2 diabetes mellitus with foot ulcer 06/19/2022 No Yes L97.522 Non-pressure chronic ulcer of other part of left foot with fat layer exposed 06/19/2022 No Yes E11.42 Type 2 diabetes mellitus with diabetic polyneuropathy 06/19/2022 No Yes Inactive Problems Resolved Problems Electronic Signature(s) Signed: 07/12/2023 1:57:42 PM By: [name] Derry PA-C Entered By: [name] Derry on 07/12/2023 13:57:42 -------------------------------------------------------------------------------- Progress Note Details Patient Name: Date of Service: [name], [name] 07/12/2023 1:45 PM Medical Record Number: [id number] Patient Account Number: [id number] Date of Birth/Sex: Treating RN: 02-07-1946 (77 y.o. [name]) [name] Primary Care Provider: [name] Other Clinician: Betha Loa Referring Provider: Treating Provider/Extender: [name] in Treatment: 55 Subjective Chief Complaint Information obtained from Patient [name], [name] ([id number]) [id number] Page 8 of 13 Left heel ulcer History of Present Illness (HPI) 09/24/2020 on evaluation today patient presents today for a heel ulcer that he tells me has been present for about 2 years. He has been seeing podiatry and they have been attempting to manage this including what sounds to be a total contact cast, Unna boot, and just standard dressings otherwise as well. Most recently has been using triple antibiotic ointment. With that being said unfortunately despite everything he really has not had any significant improvement. He tells me that he cannot even really remember exactly how this began but he presumed it may have rubbed on his shoes or something of that nature. 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 He tells me that this does seem to help. Fortunately there is no signs of active infection at this time. His most recent hemoglobin A1c was 8.1 though he knows this was this year he cannot tell me the exact time. His fluid pills currently to help with some of the lower extremity edema although he does obviously have signs of venous stasis/lymphedema. Currently there  diabetic. We have been using Hydrofera Blue under Urgo K2 lite wrap he has been in his surgical shoe although he tells me it disintegrated and he came in on a regular running shoe today. 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 He is not wearing compression stocking 06-22-2023 upon evaluation today patient appears to be doing excellent in regard to his wound on the heel which is actually significantly smaller compared to last week's evaluation. Fortunately I do not see any signs of active infection locally or systemically at this time. 06-28-2023 upon evaluation today patient appears to be doing well currently in regard to his heel which is actually showing signs of improvement. Fortunately I do not see any evidence of active infection at this time which is great news and in general I do believe that we are making headway towards complete closure which is good news. No fevers, chills, nausea, vomiting, or diarrhea. 07-05-2023 upon evaluation today patient appears to be doing well currently in regard to his heel ulcer. This is actually very close I think that being completely healed. This is great news and overall very pleased with where we stand today. 07-12-2023 upon evaluation today patient appears to be doing well currently in regard to his wound. He in fact appears to be very close to complete resolution I am very pleased that regard. However on the tip of his foot there appears to be some issue here with inflammation secondary to infection. I am very concerned about the infection at the site. Electronic Signature(s) Signed: 07/12/2023 2:41:27 PM By: [name] Derry  PA-C Entered By: [name] Derry on 07/12/2023 14:41:26 -------------------------------------------------------------------------------- Physical Exam Details Patient Name: Date of Service: [name], [name] 07/12/2023 1:45 PM Medical Record Number: [id number] Patient Account Number: [id number] Date of Birth/Sex: Treating RN: [birth year] (77 y.o. [name] Primary Care Provider: [name] Other Clinician: Betha Loa Referring Provider: Treating Provider/Extender: Hermine Messick Weeks in Treatment: 35 Constitutional Well-nourished and well-hydrated in no acute distress. Respiratory normal breathing without difficulty. Psychiatric this patient is able to make decisions and demonstrates good insight into disease process. Alert and Oriented x 3. pleasant and cooperative. Notes Upon inspection patient's wound bed actually showed signs of good granulation epithelization at this point. Fortunately I do not see any signs of infection locally or systemically anywhere except for the distal portion of his foot this is nowhere unrelated shin to the wound but does appear to be of concern. I am going to get him started on antibiotic here today as well. Electronic Signature(s) Signed: 07/12/2023 2:41:50 PM By: [name] Derry PA-C Entered By: [name] Derry on 07/12/2023 14:41:50 Barrie Folk ([id number]) [id number] Page 6 of 13 -------------------------------------------------------------------------------- Physician Orders Details Patient Name: Date of Service: [name] J. 07/12/2023 1:45 PM Medical Record Number: [id number] Patient Account Number: [id number] Date of Birth/Sex: Treating RN: April 29, 1946 (77 y.o. 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 Bathing/ Shower/ Hygiene May shower; gently cleanse wound with antibacterial soap, rinse and  pat dry prior to dressing wounds Off-Loading Open toe surgical shoe Medications-Please add to medication list. ntibiotics - start Bactrim DS P.O. A Wound Treatment Wound #10 - Calcaneus Wound Laterality: Left Topical: Mupirocin Ointment 1 x Per Week/30 Days Discharge Instructions: Apply as directed by provider. Prim Dressing: Hydrofera Blue Ready Transfer Foam, 2.5x2.5 (in/in) (Dispense As Written) 1 x Per Week/30 Days ary Discharge Instructions: Apply Hydrofera Blue Ready to wound bed as directed Secondary Dressing: ABD Pad 5x9 (in/in) 1 x Per Week/30 Days Discharge Instructions: Cover with ABD pad Compression Wrap: Urgo K2 Lite, two layer compression system, regular 1 x Per Week/30 Days Patient Medications llergies: tetracycline A Notifications Medication Indication Start End 07/12/2023 Bactrim DS DOSE 1 - oral 800 mg-160 mg tablet - 1 tablet oral twice a day x 14 days Electronic Signature(s) Signed: 07/12/2023 2:44:17 PM By: [name] Derry PA-C Entered By: [name] Derry on 07/12/2023 14:44:17 Barrie Folk ([id number]) [id number] Page 7 of 13 -------------------------------------------------------------------------------- Problem List Details Patient Name: Date of Service: [name], [name] 07/12/2023 1:45 PM Medical Record Number: [id number] Patient Account Number: [id number] Date of Birth/Sex: Treating RN: 10/10/1946 (77 y.o. [name]) [name] Primary Care Provider: [name] Other  Clinician: Betha Loa Referring Provider: Treating Provider/Extender: [name] in Treatment: 75 Active Problems ICD-10 Encounter Code Description Active Date MDM Diagnosis E11.621 Type 2 diabetes mellitus with foot ulcer 06/19/2022 No Yes L97.522 Non-pressure chronic ulcer of other part of left foot with fat layer exposed 06/19/2022 No Yes E11.42 Type 2 diabetes mellitus with diabetic polyneuropathy 06/19/2022 No Yes Inactive Problems Resolved Problems Electronic Signature(s) Signed: 07/12/2023 1:57:42 PM By: [name] Derry PA-C Entered By: [name] Derry on 07/12/2023 13:57:42 -------------------------------------------------------------------------------- Progress Note Details Patient Name: Date of Service: [name], [name] 07/12/2023 1:45 PM Medical Record Number: [id number] Patient Account Number: [id number] Date of Birth/Sex: Treating RN: 02-07-1946 (77 y.o. [name]) [name] Primary Care Provider: [name] Other Clinician: Betha Loa Referring Provider: Treating Provider/Extender: [name] in Treatment: 55 Subjective Chief Complaint Information obtained from Patient [name], [name] ([id number]) [id number] Page 8 of 13 Left heel ulcer History of Present Illness (HPI) 09/24/2020 on evaluation today patient presents today for a heel ulcer that he tells me has been present for about 2 years. He has been seeing podiatry and they have been attempting to manage this including what sounds to be a total contact cast, Unna boot, and just standard dressings otherwise as well. Most recently has been using triple antibiotic ointment. With that being said unfortunately despite everything he really has not had any significant improvement. He tells me that he cannot even really remember exactly how this began but he presumed it may have rubbed on his shoes or something of that nature. 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 He tells me that this does seem to help. Fortunately there is no signs of active infection at this time. His most recent hemoglobin A1c was 8.1 though he knows this was this year he cannot tell me the exact time. His fluid pills currently to help with some of the lower extremity edema although he does obviously have signs of venous stasis/lymphedema. Currently there  diabetic. We have been using Hydrofera Blue under Urgo K2 lite wrap he has been in his surgical shoe although he tells me it disintegrated and he came in on a regular running shoe today. 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 He is not wearing compression stocking 06-22-2023 upon evaluation today patient appears to be doing excellent in regard to his wound on the heel which is actually significantly smaller compared to last week's evaluation. Fortunately I do not see any signs of active infection locally or systemically at this time. 06-28-2023 upon evaluation today patient appears to be doing well currently in regard to his heel which is actually showing signs of improvement. Fortunately I do not see any evidence of active infection at this time which is great news and in general I do believe that we are making headway towards complete closure which is good news. No fevers, chills, nausea, vomiting, or diarrhea. 07-05-2023 upon evaluation today patient appears to be doing well currently in regard to his heel ulcer. This is actually very close I think that being completely healed. This is great news and overall very pleased with where we stand today. 07-12-2023 upon evaluation today patient appears to be doing well currently in regard to his wound. He in fact appears to be very close to complete resolution I am very pleased that regard. However on the tip of his foot there appears to be some issue here with inflammation secondary to infection. I am very concerned about the infection at the site. Electronic Signature(s) Signed: 07/12/2023 2:41:27 PM By: [name] Derry  PA-C Entered By: [name] Derry on 07/12/2023 14:41:26 -------------------------------------------------------------------------------- Physical Exam Details Patient Name: Date of Service: [name], [name] 07/12/2023 1:45 PM Medical Record Number: [id number] Patient Account Number: [id number] Date of Birth/Sex: Treating RN: [birth year] (77 y.o. [name] Primary Care Provider: [name] Other Clinician: Betha Loa Referring Provider: Treating Provider/Extender: Hermine Messick Weeks in Treatment: 35 Constitutional Well-nourished and well-hydrated in no acute distress. Respiratory normal breathing without difficulty. Psychiatric this patient is able to make decisions and demonstrates good insight into disease process. Alert and Oriented x 3. pleasant and cooperative. Notes Upon inspection patient's wound bed actually showed signs of good granulation epithelization at this point. Fortunately I do not see any signs of infection locally or systemically anywhere except for the distal portion of his foot this is nowhere unrelated shin to the wound but does appear to be of concern. I am going to get him started on antibiotic here today as well. Electronic Signature(s) Signed: 07/12/2023 2:41:50 PM By: [name] Derry PA-C Entered By: [name] Derry on 07/12/2023 14:41:50 Barrie Folk ([id number]) [id number] Page 6 of 13 -------------------------------------------------------------------------------- Physician Orders Details Patient Name: Date of Service: [name] J. 07/12/2023 1:45 PM Medical Record Number: [id number] Patient Account Number: [id number] Date of Birth/Sex: Treating RN: April 29, 1946 (77 y.o. 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 Bathing/ Shower/ Hygiene May shower; gently cleanse wound with antibacterial soap, rinse and

## 2023-07-12 NOTE — Progress Notes (Signed)
JASMIN, ROUTE ([id number]) [id number] Page 1 of 9 Visit Report for 07/12/2023 Arrival Information Details Patient Name: Date of Service: [name], GEO [facility] J. 07/12/2023 1:45 PM Medical Record Number: [id number] Patient Account Number: [id number] Date of Birth/Sex: Treating RN: 16-Mar-1946 (77 y.o. [name] Petit) [name] Primary Care [name]: [name] Other Clinician: Betha Loa Referring [name]: Treating Brycelynn Stampley/Extender: [name] in Treatment: 83 Visit Information History Since Last Visit All ordered tests and consults were completed: No Patient Arrived: Wheel Chair Added or deleted any medications: No Arrival Time: 13:50 Any new allergies or adverse reactions: No Transfer Assistance: None Had a fall or experienced change in No Patient Identification Verified: Yes activities of daily living that may affect Secondary Verification Process Completed: Yes risk of falls: Patient Requires Transmission-Based Precautions: No Signs or symptoms of abuse/neglect since last visito No Patient Has Alerts: Yes Hospitalized since last visit: No Patient Alerts: Patient on Blood Thinner Implantable device outside of the clinic excluding No Warfarin cellular tissue based products placed in the center 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 07/12/2023 1:45 PM Medical Record Number: [id number] Patient Account Number: [id number] Date of Birth/Sex: Treating RN: 1945-12-26 (77 y.o. [name] Petit) [name] Primary Care Bernadett Milian: [name] Other  Clinician: Betha Loa Referring [name]: Treating Larkin Morelos/Extender: [name] in Treatment: 55 Clinic Level of Care Assessment Items TOOL 1 Quantity Score []  - 0 Use when EandM and Procedure is performed on INITIAL visit ASSESSMENTS - Nursing Assessment / Reassessment []  - 0 [name] Physical Exam (combine w/ comprehensive assessment (listed just below) when performed on new pt. [address] [name], [name] ([id number]) [id number] Page 2 of 9 []  - 0 Comprehensive Assessment (HX, ROS, Risk Assessments, Wounds Hx, etc.) ASSESSMENTS - Wound and Skin Assessment / Reassessment []  - 0 Dermatologic / Skin Assessment (not related to wound area) ASSESSMENTS - Ostomy and/or Continence Assessment and Care []  - 0 Incontinence Assessment and Management []  - 0 Ostomy Care Assessment and Management (repouching, etc.) PROCESS - Coordination of Care []  - 0 Simple Patient / Family Education for ongoing care []  - 0 Complex (extensive) Patient / Family Education for ongoing care []  - 0 Staff obtains Chiropractor, Records, T Results / Process Orders est []  - 0 Staff telephones HHA, Nursing Homes / Clarify orders / etc []  - 0 Routine Transfer to another Facility (non-emergent condition) []  - 0 Routine Hospital Admission (non-emergent condition) []  - 0 New Admissions / Manufacturing engineer / Ordering NPWT Apligraf, etc. , []  - 0 Emergency Hospital Admission (emergent condition) PROCESS - Special Needs []  - 0 Pediatric / Minor Patient Management []  - 0 Isolation Patient Management []  - 0 Hearing / Language / Visual special needs []  - 0 Assessment of Community assistance (transportation, D/C planning, etc.) []  - 0 Additional assistance / Altered mentation []  - 0 Support Surface(s) Assessment (bed, cushion, seat, etc.) INTERVENTIONS - Miscellaneous []  - 0 External ear exam []  - 0 Patient Transfer (multiple staff / Nurse, adult /  Similar devices) []  - 0 Simple Staple / Suture removal (25 or less) []  - 0 Complex Staple / Suture removal (26 or more) []  - 0 Hypo/Hyperglycemic Management (do not check if billed  JASMIN, ROUTE ([id number]) [id number] Page 1 of 9 Visit Report for 07/12/2023 Arrival Information Details Patient Name: Date of Service: [name], GEO [facility] J. 07/12/2023 1:45 PM Medical Record Number: [id number] Patient Account Number: [id number] Date of Birth/Sex: Treating RN: 16-Mar-1946 (77 y.o. [name] Petit) [name] Primary Care [name]: [name] Other Clinician: Betha Loa Referring [name]: Treating Brycelynn Stampley/Extender: [name] in Treatment: 83 Visit Information History Since Last Visit All ordered tests and consults were completed: No Patient Arrived: Wheel Chair Added or deleted any medications: No Arrival Time: 13:50 Any new allergies or adverse reactions: No Transfer Assistance: None Had a fall or experienced change in No Patient Identification Verified: Yes activities of daily living that may affect Secondary Verification Process Completed: Yes risk of falls: Patient Requires Transmission-Based Precautions: No Signs or symptoms of abuse/neglect since last visito No Patient Has Alerts: Yes Hospitalized since last visit: No Patient Alerts: Patient on Blood Thinner Implantable device outside of the clinic excluding No Warfarin cellular tissue based products placed in the center 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 07/12/2023 1:45 PM Medical Record Number: [id number] Patient Account Number: [id number] Date of Birth/Sex: Treating RN: 1945-12-26 (77 y.o. [name] Petit) [name] Primary Care Bernadett Milian: [name] Other  Clinician: Betha Loa Referring [name]: Treating Larkin Morelos/Extender: [name] in Treatment: 55 Clinic Level of Care Assessment Items TOOL 1 Quantity Score []  - 0 Use when EandM and Procedure is performed on INITIAL visit ASSESSMENTS - Nursing Assessment / Reassessment []  - 0 [name] Physical Exam (combine w/ comprehensive assessment (listed just below) when performed on new pt. [address] [name], [name] ([id number]) [id number] Page 2 of 9 []  - 0 Comprehensive Assessment (HX, ROS, Risk Assessments, Wounds Hx, etc.) ASSESSMENTS - Wound and Skin Assessment / Reassessment []  - 0 Dermatologic / Skin Assessment (not related to wound area) ASSESSMENTS - Ostomy and/or Continence Assessment and Care []  - 0 Incontinence Assessment and Management []  - 0 Ostomy Care Assessment and Management (repouching, etc.) PROCESS - Coordination of Care []  - 0 Simple Patient / Family Education for ongoing care []  - 0 Complex (extensive) Patient / Family Education for ongoing care []  - 0 Staff obtains Chiropractor, Records, T Results / Process Orders est []  - 0 Staff telephones HHA, Nursing Homes / Clarify orders / etc []  - 0 Routine Transfer to another Facility (non-emergent condition) []  - 0 Routine Hospital Admission (non-emergent condition) []  - 0 New Admissions / Manufacturing engineer / Ordering NPWT Apligraf, etc. , []  - 0 Emergency Hospital Admission (emergent condition) PROCESS - Special Needs []  - 0 Pediatric / Minor Patient Management []  - 0 Isolation Patient Management []  - 0 Hearing / Language / Visual special needs []  - 0 Assessment of Community assistance (transportation, D/C planning, etc.) []  - 0 Additional assistance / Altered mentation []  - 0 Support Surface(s) Assessment (bed, cushion, seat, etc.) INTERVENTIONS - Miscellaneous []  - 0 External ear exam []  - 0 Patient Transfer (multiple staff / Nurse, adult /  Similar devices) []  - 0 Simple Staple / Suture removal (25 or less) []  - 0 Complex Staple / Suture removal (26 or more) []  - 0 Hypo/Hyperglycemic Management (do not check if billed  in Volume: Grade 1 N/A N/A Classification: Medium N/A N/A Exudate A mount: Serosanguineous N/A N/A Exudate Type: red, brown N/A N/A Exudate Color: Flat and Intact N/A N/A Wound Margin: Small (1-33%) N/A N/A Granulation A mount: Pink N/A N/A Granulation Quality: Small (1-33%) N/A N/A Necrotic A mount: Fat Layer (Subcutaneous Tissue): Yes N/A N/A Exposed Structures: Fascia: No Tendon: No Muscle: No Joint: No Bone: No Medium (34-66%) N/A N/A Epithelialization: Treatment Notes Electronic Signature(s) Signed: 07/12/2023 5:11:18 PM By: Betha Loa Entered By: Betha Loa on 07/12/2023 14:17:52 -------------------------------------------------------------------------------- Multi-Disciplinary Care Plan Details Patient Name: Date of Service: [name], [name] 07/12/2023 1:45 PM Medical Record Number: [id number] Patient Account Number: [id number] Date of Birth/Sex: Treating RN: [birth year] (77 y.o. [name] Primary Care Greco Gastelum: [name] Other Clinician: Betha Loa Referring Jouri Threat: Treating Haiden Clucas/Extender: [name] in Treatment: 55 Active Inactive Wound/Skin Impairment Nursing Diagnoses: Impaired tissue integrity Goals: Patient/caregiver will verbalize understanding of skin care regimen Date Initiated: 06/19/2022 Target Resolution Date: 07/19/2022 Goal Status: Active Ulcer/skin breakdown will have a volume reduction of 30% by week 4 Date Initiated: 06/19/2022 Date Inactivated: 07/28/2022 Target Resolution Date: 07/17/2022 Goal Status: Unmet Unmet Reason: infection SAMNANG, CAYSON ([id number]) [id number] Page 6 of 9 Ulcer/skin breakdown will have a volume reduction of 50% by week 8 Date Initiated: 07/28/2022 Date Inactivated: 05/18/2023 Target Resolution Date: 03/20/2023 Goal Status: Unmet Unmet Reason: comorbidities Interventions: Assess patient/caregiver ability to obtain necessary supplies Assess patient/caregiver ability to perform ulcer/skin care regimen upon admission and as needed Assess ulceration(s) every visit Provide education on ulcer and skin care Treatment Activities: Skin care regimen initiated : 06/19/2022 Topical wound management initiated : 06/19/2022 Notes: Electronic Signature(s) Signed: 07/12/2023 5:11:18 PM By: Betha Loa Signed: 07/15/2023 4:15:38 PM By: [name] RN Entered By: Betha Loa on 07/12/2023 14:31:07 -------------------------------------------------------------------------------- Pain Assessment Details Patient Name: Date of Service: [name], [name] 07/12/2023 1:45 PM Medical Record Number: [id number] Patient Account Number: [id number] Date of Birth/Sex: Treating RN: [birth year] (77 y.o. [name] Primary Care Jayma Volpi: [name] Other Clinician: Betha Loa Referring Naiomi Musto: Treating Klani Caridi/Extender: [name] in Treatment: 55 Active Problems Location  of Pain Severity and Description of Pain Patient Has Paino No Site Locations Pain Management and Medication Current Pain Management: Electronic Signature(s) Signed: 07/12/2023 5:11:18 PM By: Betha Loa Signed: 07/15/2023 4:15:38 PM By: [name] RN Barrie Folk ([id number]) [id number] Page 7 of 9 Entered By: Betha Loa on 07/12/2023 14:05:23 -------------------------------------------------------------------------------- Patient/Caregiver Education Details Patient Name: Date of Service: [name] [facility] 9/23/2024andnbsp1:45 PM Medical Record Number: [id number] Patient Account Number: [id number] Date of Birth/Gender: Treating RN: [birth year] (77 y.o. [name] Petit) [name] Primary Care Physician: [name] Other Clinician: Betha Loa Referring Physician: Treating Physician/Extender: [name] in Treatment: 75 Education Assessment Education Provided To: Patient Education Topics Provided Wound/Skin Impairment: Handouts: Other: continue wound care as directed Methods: Explain/Verbal Responses: State content correctly Electronic Signature(s) Signed: 07/12/2023 5:11:18 PM By: Betha Loa Entered By: Betha Loa on 07/12/2023 15:18:18 -------------------------------------------------------------------------------- Wound Assessment Details Patient Name: Date of Service: [name], [name] 07/12/2023 1:45 PM Medical Record Number: [id number] Patient Account Number: [id number] Date of Birth/Sex: Treating RN: 1946/08/30 (77 y.o. 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 Secondary  in Volume: Grade 1 N/A N/A Classification: Medium N/A N/A Exudate A mount: Serosanguineous N/A N/A Exudate Type: red, brown N/A N/A Exudate Color: Flat and Intact N/A N/A Wound Margin: Small (1-33%) N/A N/A Granulation A mount: Pink N/A N/A Granulation Quality: Small (1-33%) N/A N/A Necrotic A mount: Fat Layer (Subcutaneous Tissue): Yes N/A N/A Exposed Structures: Fascia: No Tendon: No Muscle: No Joint: No Bone: No Medium (34-66%) N/A N/A Epithelialization: Treatment Notes Electronic Signature(s) Signed: 07/12/2023 5:11:18 PM By: Betha Loa Entered By: Betha Loa on 07/12/2023 14:17:52 -------------------------------------------------------------------------------- Multi-Disciplinary Care Plan Details Patient Name: Date of Service: [name], [name] 07/12/2023 1:45 PM Medical Record Number: [id number] Patient Account Number: [id number] Date of Birth/Sex: Treating RN: [birth year] (77 y.o. [name] Primary Care Greco Gastelum: [name] Other Clinician: Betha Loa Referring Jouri Threat: Treating Haiden Clucas/Extender: [name] in Treatment: 55 Active Inactive Wound/Skin Impairment Nursing Diagnoses: Impaired tissue integrity Goals: Patient/caregiver will verbalize understanding of skin care regimen Date Initiated: 06/19/2022 Target Resolution Date: 07/19/2022 Goal Status: Active Ulcer/skin breakdown will have a volume reduction of 30% by week 4 Date Initiated: 06/19/2022 Date Inactivated: 07/28/2022 Target Resolution Date: 07/17/2022 Goal Status: Unmet Unmet Reason: infection SAMNANG, CAYSON ([id number]) [id number] Page 6 of 9 Ulcer/skin breakdown will have a volume reduction of 50% by week 8 Date Initiated: 07/28/2022 Date Inactivated: 05/18/2023 Target Resolution Date: 03/20/2023 Goal Status: Unmet Unmet Reason: comorbidities Interventions: Assess patient/caregiver ability to obtain necessary supplies Assess patient/caregiver ability to perform ulcer/skin care regimen upon admission and as needed Assess ulceration(s) every visit Provide education on ulcer and skin care Treatment Activities: Skin care regimen initiated : 06/19/2022 Topical wound management initiated : 06/19/2022 Notes: Electronic Signature(s) Signed: 07/12/2023 5:11:18 PM By: Betha Loa Signed: 07/15/2023 4:15:38 PM By: [name] RN Entered By: Betha Loa on 07/12/2023 14:31:07 -------------------------------------------------------------------------------- Pain Assessment Details Patient Name: Date of Service: [name], [name] 07/12/2023 1:45 PM Medical Record Number: [id number] Patient Account Number: [id number] Date of Birth/Sex: Treating RN: [birth year] (77 y.o. [name] Primary Care Jayma Volpi: [name] Other Clinician: Betha Loa Referring Naiomi Musto: Treating Klani Caridi/Extender: [name] in Treatment: 55 Active Problems Location  of Pain Severity and Description of Pain Patient Has Paino No Site Locations Pain Management and Medication Current Pain Management: Electronic Signature(s) Signed: 07/12/2023 5:11:18 PM By: Betha Loa Signed: 07/15/2023 4:15:38 PM By: [name] RN Barrie Folk ([id number]) [id number] Page 7 of 9 Entered By: Betha Loa on 07/12/2023 14:05:23 -------------------------------------------------------------------------------- Patient/Caregiver Education Details Patient Name: Date of Service: [name] [facility] 9/23/2024andnbsp1:45 PM Medical Record Number: [id number] Patient Account Number: [id number] Date of Birth/Gender: Treating RN: [birth year] (77 y.o. [name] Petit) [name] Primary Care Physician: [name] Other Clinician: Betha Loa Referring Physician: Treating Physician/Extender: [name] in Treatment: 75 Education Assessment Education Provided To: Patient Education Topics Provided Wound/Skin Impairment: Handouts: Other: continue wound care as directed Methods: Explain/Verbal Responses: State content correctly Electronic Signature(s) Signed: 07/12/2023 5:11:18 PM By: Betha Loa Entered By: Betha Loa on 07/12/2023 15:18:18 -------------------------------------------------------------------------------- Wound Assessment Details Patient Name: Date of Service: [name], [name] 07/12/2023 1:45 PM Medical Record Number: [id number] Patient Account Number: [id number] Date of Birth/Sex: Treating RN: 1946/08/30 (77 y.o. 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

## 2023-07-15 DIAGNOSIS — R791 Abnormal coagulation profile: Secondary | ICD-10-CM | POA: Diagnosis not present

## 2023-07-19 ENCOUNTER — Encounter: Payer: Medicare HMO | Admitting: Physician Assistant

## 2023-07-19 DIAGNOSIS — Z952 Presence of prosthetic heart valve: Secondary | ICD-10-CM | POA: Diagnosis not present

## 2023-07-19 DIAGNOSIS — L97422 Non-pressure chronic ulcer of left heel and midfoot with fat layer exposed: Secondary | ICD-10-CM | POA: Diagnosis not present

## 2023-07-19 DIAGNOSIS — E1142 Type 2 diabetes mellitus with diabetic polyneuropathy: Secondary | ICD-10-CM | POA: Diagnosis not present

## 2023-07-19 DIAGNOSIS — L97522 Non-pressure chronic ulcer of other part of left foot with fat layer exposed: Secondary | ICD-10-CM | POA: Diagnosis not present

## 2023-07-19 DIAGNOSIS — E11621 Type 2 diabetes mellitus with foot ulcer: Secondary | ICD-10-CM | POA: Diagnosis not present

## 2023-07-19 DIAGNOSIS — G8929 Other chronic pain: Secondary | ICD-10-CM | POA: Diagnosis not present

## 2023-07-19 DIAGNOSIS — Z7901 Long term (current) use of anticoagulants: Secondary | ICD-10-CM | POA: Diagnosis not present

## 2023-07-19 DIAGNOSIS — I10 Essential (primary) hypertension: Secondary | ICD-10-CM | POA: Diagnosis not present

## 2023-07-19 DIAGNOSIS — E1151 Type 2 diabetes mellitus with diabetic peripheral angiopathy without gangrene: Secondary | ICD-10-CM | POA: Diagnosis not present

## 2023-07-19 NOTE — Progress Notes (Signed)
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 Bathing/ Shower/ Hygiene May shower; gently cleanse wound with antibacterial soap, rinse and pat dry prior to dressing wounds Additional Orders / Instructions Other: - apply Urea cream to heels at night Medications-Please add to medication list. ntibiotics - continue Bactrim DS P.O. A Wound Treatment Wound #10 - Calcaneus Wound Laterality: Left Peri-Wound Care: AandD Ointment 1 x Per Week/30 Days Discharge Instructions: Apply AandD Ointment to heels and legs in the mornings Secondary Dressing: ABD Pad 5x9 (in/in) 1 x Per Week/30 Days Discharge Instructions: Cover heal with ABD pad for protection Secured With: Medipore T - 33M Medipore H Soft Cloth Surgical T ape ape, 2x2 (in/yd) 1 x Per Week/30 Days Secured With: Conform 4'' - Conforming Stretch Gauze Bandage 4x75 (in/in) 1 x Per Week/30 Days Discharge Instructions: Apply as directed Secured With: Tubigrip Size D, 3x10 (in/yd) 1 x Per Week/30 Days Discharge Instructions: double D Electronic Signature(s) Unsigned Entered By: Betha Loa on 07/19/2023 14:39:09 Signature(s): JARYD, DREW ([id number]) [phone] Date(s): [id number] Page 7 of 13 -------------------------------------------------------------------------------- Problem List Details Patient Name: Date of Service: [name] 07/19/2023 1:45 PM Medical Record  Number: [id number] Patient Account Number: [id number] Date of Birth/Sex: Treating RN: 1946/06/24 (77 y.o. [name] Petit) [name] Primary Care Provider: [name] Other Clinician: Betha Loa Referring Provider: Treating Provider/Extender: [name] in Treatment: 19 Active Problems ICD-10 Encounter Code Description Active Date MDM Diagnosis E11.621 Type 2 diabetes mellitus with foot ulcer 06/19/2022 No Yes L97.522 Non-pressure chronic ulcer of other part of left foot with fat layer exposed 06/19/2022 No Yes E11.42 Type 2 diabetes mellitus with diabetic polyneuropathy 06/19/2022 No Yes Inactive Problems Resolved Problems Electronic Signature(s) Signed: 07/19/2023 2:01:03 PM By: [name] Derry PA-C Entered By: [name] Derry on 07/19/2023 14:01:03 -------------------------------------------------------------------------------- Progress Note Details Patient Name: Date of Service: [name], [name] [name] 07/19/2023 1:45 PM Medical Record Number: [id number] Patient Account Number: [id number] Date of Birth/Sex: Treating RN: Jan 22, 1946 (77 y.o. [name] Petit) [name] Primary Care Provider: [name] Other Clinician: Betha Loa Referring Provider: Treating Provider/Extender: [name] in Treatment: 56 Subjective Chief Complaint Information obtained from Patient [name], [name] ([id number]) [id number] Page 8 of 13 Left heel ulcer History of Present Illness (HPI) 09/24/2020 on evaluation today patient presents today for a heel ulcer that he tells me has been present for about 2 years. He has been seeing podiatry and they have been attempting to manage this including what sounds to be a total contact cast, Unna boot, and just standard dressings otherwise as well. Most recently has been using triple antibiotic ointment. With that being said unfortunately despite everything he really has not had any significant improvement.  He tells me that he cannot even really remember exactly how this began but he presumed it may have rubbed on his shoes or something of that nature. 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 He tells me that this does seem to help. Fortunately there is no signs of active infection at this time. His most  diabetic. We have been using Hydrofera Blue under Urgo K2 lite wrap he has been in his surgical shoe although he tells me it disintegrated and he came in on a regular running shoe today. 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 He is not wearing compression stocking 06-22-2023 upon evaluation today patient appears to be doing excellent in regard to his wound on the heel which is actually significantly smaller compared to last week's evaluation. Fortunately I do not see any signs of active infection locally or systemically at this time. 06-28-2023 upon evaluation today patient appears to be doing well currently in regard to his heel which is actually showing signs of improvement. Fortunately I do not see any evidence of active infection at this time which is great news and in [name] I do believe that we are making headway towards complete closure which is good news. No fevers, chills, nausea, vomiting, or diarrhea. 07-05-2023 upon evaluation today patient appears to be doing well currently in regard to his heel ulcer. This is actually very close I think that being completely healed. This is great news and overall very pleased with where we stand today. 07-12-2023 upon evaluation today patient appears to be doing well currently in regard to his wound. He in fact appears to be very close to complete resolution I am very pleased that regard. However on the tip of his foot there appears to be some issue here with inflammation secondary to infection. I am very concerned about the infection at the site. 07-19-2023 upon evaluation today patient appears to actually be doing well in  regard to his heel. Fortunately I do not see any signs of worsening overall. In [name] I think you are making headway towards closure. Electronic Signature(s) Signed: 07/19/2023 3:02:10 PM By: [name] Derry PA-C Entered By: [name] Derry on 07/19/2023 15:02:10 -------------------------------------------------------------------------------- Physical Exam Details Patient Name: Date of Service: [name] [facility] J. 07/19/2023 1:45 PM Medical Record Number: [id number] Patient Account Number: [id number] Date of Birth/Sex: Treating RN: 1945/11/26 (77 y.o. [name] Primary Care Provider: [name] Other Clinician: Betha Loa Referring Provider: Treating Provider/Extender: Hermine Messick Weeks in Treatment: 57 Constitutional Well-nourished and well-hydrated in no acute distress. Respiratory normal breathing without difficulty. Psychiatric this patient is able to make decisions and demonstrates good insight into disease process. Alert and Oriented x 3. pleasant and cooperative. Notes Anything that looks to be obviously open I just want a monitor for 1 week allow him to be able to put some Upon inspection patient's wound bed actually showed signs of good granulation epithelization at this point. Fortunately I do not see lotion on the area and try to get this nice and soft and loosen up so we will see how things look come next week. He is in agreement with that plan. Electronic Signature(s) Signed: 07/19/2023 3:02:34 PM By: Ricarda Frame, [name] Lunger ([id number]) [id number] Page 6 of 13 Entered By: [name] Derry on 07/19/2023 15:02:34 -------------------------------------------------------------------------------- Physician Orders Details Patient Name: Date of Service: [name] [facility] 07/19/2023 1:45 PM Medical Record Number: [id number] Patient Account Number: [id number] Date of Birth/Sex: Treating RN: Apr 13, 1946 (77 y.o. 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 Bathing/ Shower/ Hygiene May shower; gently cleanse wound with antibacterial soap, rinse and pat dry prior to dressing wounds Additional Orders / Instructions Other: - apply Urea cream to heels at night Medications-Please add to medication list. ntibiotics - continue Bactrim DS P.O. A Wound Treatment Wound #10 - Calcaneus Wound Laterality: Left Peri-Wound Care: AandD Ointment 1 x Per Week/30 Days Discharge Instructions: Apply AandD Ointment to heels and legs in the mornings Secondary Dressing: ABD Pad 5x9 (in/in) 1 x Per Week/30 Days Discharge Instructions: Cover heal with ABD pad for protection Secured With: Medipore T - 33M Medipore H Soft Cloth Surgical T ape ape, 2x2 (in/yd) 1 x Per Week/30 Days Secured With: Conform 4'' - Conforming Stretch Gauze Bandage 4x75 (in/in) 1 x Per Week/30 Days Discharge Instructions: Apply as directed Secured With: Tubigrip Size D, 3x10 (in/yd) 1 x Per Week/30 Days Discharge Instructions: double D Electronic Signature(s) Unsigned Entered By: Betha Loa on 07/19/2023 14:39:09 Signature(s): JARYD, DREW ([id number]) [phone] Date(s): [id number] Page 7 of 13 -------------------------------------------------------------------------------- Problem List Details Patient Name: Date of Service: [name] 07/19/2023 1:45 PM Medical Record  Number: [id number] Patient Account Number: [id number] Date of Birth/Sex: Treating RN: 1946/06/24 (77 y.o. [name] Petit) [name] Primary Care Provider: [name] Other Clinician: Betha Loa Referring Provider: Treating Provider/Extender: [name] in Treatment: 19 Active Problems ICD-10 Encounter Code Description Active Date MDM Diagnosis E11.621 Type 2 diabetes mellitus with foot ulcer 06/19/2022 No Yes L97.522 Non-pressure chronic ulcer of other part of left foot with fat layer exposed 06/19/2022 No Yes E11.42 Type 2 diabetes mellitus with diabetic polyneuropathy 06/19/2022 No Yes Inactive Problems Resolved Problems Electronic Signature(s) Signed: 07/19/2023 2:01:03 PM By: [name] Derry PA-C Entered By: [name] Derry on 07/19/2023 14:01:03 -------------------------------------------------------------------------------- Progress Note Details Patient Name: Date of Service: [name], [name] [name] 07/19/2023 1:45 PM Medical Record Number: [id number] Patient Account Number: [id number] Date of Birth/Sex: Treating RN: Jan 22, 1946 (77 y.o. [name] Petit) [name] Primary Care Provider: [name] Other Clinician: Betha Loa Referring Provider: Treating Provider/Extender: [name] in Treatment: 56 Subjective Chief Complaint Information obtained from Patient [name], [name] ([id number]) [id number] Page 8 of 13 Left heel ulcer History of Present Illness (HPI) 09/24/2020 on evaluation today patient presents today for a heel ulcer that he tells me has been present for about 2 years. He has been seeing podiatry and they have been attempting to manage this including what sounds to be a total contact cast, Unna boot, and just standard dressings otherwise as well. Most recently has been using triple antibiotic ointment. With that being said unfortunately despite everything he really has not had any significant improvement.  He tells me that he cannot even really remember exactly how this began but he presumed it may have rubbed on his shoes or something of that nature. 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 He tells me that this does seem to help. Fortunately there is no signs of active infection at this time. His most  diabetic. We have been using Hydrofera Blue under Urgo K2 lite wrap he has been in his surgical shoe although he tells me it disintegrated and he came in on a regular running shoe today. 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 He is not wearing compression stocking 06-22-2023 upon evaluation today patient appears to be doing excellent in regard to his wound on the heel which is actually significantly smaller compared to last week's evaluation. Fortunately I do not see any signs of active infection locally or systemically at this time. 06-28-2023 upon evaluation today patient appears to be doing well currently in regard to his heel which is actually showing signs of improvement. Fortunately I do not see any evidence of active infection at this time which is great news and in [name] I do believe that we are making headway towards complete closure which is good news. No fevers, chills, nausea, vomiting, or diarrhea. 07-05-2023 upon evaluation today patient appears to be doing well currently in regard to his heel ulcer. This is actually very close I think that being completely healed. This is great news and overall very pleased with where we stand today. 07-12-2023 upon evaluation today patient appears to be doing well currently in regard to his wound. He in fact appears to be very close to complete resolution I am very pleased that regard. However on the tip of his foot there appears to be some issue here with inflammation secondary to infection. I am very concerned about the infection at the site. 07-19-2023 upon evaluation today patient appears to actually be doing well in  regard to his heel. Fortunately I do not see any signs of worsening overall. In [name] I think you are making headway towards closure. Electronic Signature(s) Signed: 07/19/2023 3:02:10 PM By: [name] Derry PA-C Entered By: [name] Derry on 07/19/2023 15:02:10 -------------------------------------------------------------------------------- Physical Exam Details Patient Name: Date of Service: [name] [facility] J. 07/19/2023 1:45 PM Medical Record Number: [id number] Patient Account Number: [id number] Date of Birth/Sex: Treating RN: 1945/11/26 (77 y.o. [name] Primary Care Provider: [name] Other Clinician: Betha Loa Referring Provider: Treating Provider/Extender: Hermine Messick Weeks in Treatment: 57 Constitutional Well-nourished and well-hydrated in no acute distress. Respiratory normal breathing without difficulty. Psychiatric this patient is able to make decisions and demonstrates good insight into disease process. Alert and Oriented x 3. pleasant and cooperative. Notes Anything that looks to be obviously open I just want a monitor for 1 week allow him to be able to put some Upon inspection patient's wound bed actually showed signs of good granulation epithelization at this point. Fortunately I do not see lotion on the area and try to get this nice and soft and loosen up so we will see how things look come next week. He is in agreement with that plan. Electronic Signature(s) Signed: 07/19/2023 3:02:34 PM By: Ricarda Frame, [name] Lunger ([id number]) [id number] Page 6 of 13 Entered By: [name] Derry on 07/19/2023 15:02:34 -------------------------------------------------------------------------------- Physician Orders Details Patient Name: Date of Service: [name] [facility] 07/19/2023 1:45 PM Medical Record Number: [id number] Patient Account Number: [id number] Date of Birth/Sex: Treating RN: Apr 13, 1946 (77 y.o. 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 [name] Petit) [name] Primary Care Provider: [name] Other Clinician: Betha Loa Referring Provider: Treating Provider/Extender: [name] in Treatment: 24 Information Obtained from: Patient Chief Complaint Left heel ulcer Electronic Signature(s) Signed: 07/19/2023 2:01:08 PM By: [name] Derry PA-C Entered By: [name] Derry on 07/19/2023 14:01:08 -------------------------------------------------------------------------------- HPI Details Patient Name: Date of Service: [name], [name] [name] 07/19/2023 1:45 PM Medical Record Number: [id number] Patient Account Number: [id number] Date of Birth/Sex: Treating RN: 03-04-1946 (77 y.o. [name] Primary Care Provider: [name] Other Clinician: Betha Loa Referring Provider: Treating Provider/Extender: [name] in Treatment: 19 History of Present Illness HPI Description: 09/24/2020 on evaluation today patient presents today for a heel ulcer that he tells me has been present for about 2 years. He has been seeing podiatry and they have been attempting to manage this including what sounds to be a total contact cast, Unna boot, and just standard dressings otherwise as well. Most recently has been using triple antibiotic ointment. With that being said unfortunately despite everything he really has not had any significant improvement. He tells me that he cannot even really remember exactly how this began but he presumed it may have rubbed on his shoes or something of that nature. 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 He tells me that this does seem to help. Fortunately there is no signs of active infection at this time. His most recent hemoglobin A1c was 8.1 though he knows this was this year he cannot tell me the exact time. His fluid pills currently to help with some of the lower extremity edema although he does obviously have signs of venous stasis/lymphedema. Currently there is no evidence of active infection. No fevers, chills, nausea, vomiting, or diarrhea. Patient has had fairly recent ABIs which were performed on 07/19/2020 and revealed that he has normal findings in both the ankle and toe locations bilaterally. His ABI on the right was 1.09 on the left was 1.08 with a TBI on the right of 0.88 and on the left of 0.94. Triphasic flow was noted throughout. 10/08/2020 on evaluation today patient appears to be doing pretty well in regard to his left heel currently in fact this is doing a great job and seems to be healing quite nicely. Unfortunately on his right leg he had a pile of wood that actually fell on him injuring his right leg this is somewhat erythematous has me concerned little bit about cellulitis though there is not really a good area to culture at this point. [name], [name] ([id number]) [id number] Page 2 of 13 10/24/2020 upon evaluation today patient appears to be doing well with regard to his heel ulcer. He is showing signs of improvement which is great news. His right leg is completely healed. Overall I feel like he is doing excellent and there is no signs of infection. 11/07/2020 upon evaluation today patient appears to be doing well with regard to  to make decisions and demonstrates good insight into disease process. Alert and Oriented x 3. pleasant and cooperative. [name] Notes: Anything that looks to be obviously open I just want a monitor for 1 week allow him to be able to put some Upon inspection patient's wound bed actually showed signs of good granulation epithelization at this point. Fortunately I do not see lotion on the area and try to get this nice and soft and loosen up so we will see how things look come next week. He is in agreement with that plan. Integumentary (Hair, Skin) Wound #10 status is Open. Original cause of wound was Gradually Appeared. The date acquired was: 06/05/2022. The wound has been in treatment 56 weeks. The wound is located on the Left Calcaneus. The wound measures 0.1cm length x 0.1cm width x 0.1cm depth; 0.008cm^2 area and 0.001cm^3 volume. There is Fat Layer (Subcutaneous Tissue) exposed. There is a medium amount of serosanguineous drainage noted. The wound margin is flat and intact. There is small (1-33%) pink granulation within the wound bed. There is a small (1-33%) amount of necrotic tissue within the wound bed including Adherent Slough. AHMAR, PICKRELL ([id number]) [id number] Page 12 of 13 Assessment Active Problems ICD-10 Type 2 diabetes mellitus with foot ulcer Non-pressure chronic  ulcer of other part of left foot with fat layer exposed Type 2 diabetes mellitus with diabetic polyneuropathy Plan Follow-up Appointments: Wound #10 Left Calcaneus: Return Appointment in 1 week. Bathing/ Shower/ Hygiene: May shower; gently cleanse wound with antibacterial soap, rinse and pat dry prior to dressing wounds Additional Orders / Instructions: Other: - apply Urea cream to heels at night Medications-Please add to medication list.: P.O. Antibiotics - continue Bactrim DS WOUND #10: - Calcaneus Wound Laterality: Left Peri-Wound Care: AandD Ointment 1 x Per Week/30 Days Discharge Instructions: Apply AandD Ointment to heels and legs in the mornings Secondary Dressing: ABD Pad 5x9 (in/in) 1 x Per Week/30 Days Discharge Instructions: Cover heal with ABD pad for protection Secured With: Medipore T - 61M Medipore H Soft Cloth Surgical T ape ape, 2x2 (in/yd) 1 x Per Week/30 Days Secured With: Conform 4'' - Conforming Stretch Gauze Bandage 4x75 (in/in) 1 x Per Week/30 Days Discharge Instructions: Apply as directed Secured With: Tubigrip Size D, 3x10 (in/yd) 1 x Per Week/30 Days Discharge Instructions: double D 1. I am going to not wrap him this week so that he can actually apply lotion twice a day is good be using the 40% urea cream at night and in the morning he will be put on the AandD ointment. 2. I would recommend as well that the patient should continue to monitor for any signs of infection or worsening if anything changes he knows to contact the office and let me know. We will see patient back for reevaluation in 1 week here in the clinic. If anything worsens or changes patient will contact our office for additional recommendations. Electronic Signature(s) Signed: 07/19/2023 3:02:56 PM By: [name] Derry PA-C Entered By: [name] Derry on 07/19/2023 15:02:56 -------------------------------------------------------------------------------- SuperBill Details Patient Name: Date of  Service: [name], [name] [name] 07/19/2023 Medical Record Number: [id number] Patient Account Number: [id number] Date of Birth/Sex: Treating RN: 01-Apr-1946 (77 y.o. 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 Bathing/ Shower/ Hygiene May shower; gently cleanse wound with antibacterial soap, rinse and pat dry prior to dressing wounds Additional Orders / Instructions Other: - apply Urea cream to heels at night Medications-Please add to medication list. ntibiotics - continue Bactrim DS P.O. A Wound Treatment Wound #10 - Calcaneus Wound Laterality: Left Peri-Wound Care: AandD Ointment 1 x Per Week/30 Days Discharge Instructions: Apply AandD Ointment to heels and legs in the mornings Secondary Dressing: ABD Pad 5x9 (in/in) 1 x Per Week/30 Days Discharge Instructions: Cover heal with ABD pad for protection Secured With: Medipore T - 33M Medipore H Soft Cloth Surgical T ape ape, 2x2 (in/yd) 1 x Per Week/30 Days Secured With: Conform 4'' - Conforming Stretch Gauze Bandage 4x75 (in/in) 1 x Per Week/30 Days Discharge Instructions: Apply as directed Secured With: Tubigrip Size D, 3x10 (in/yd) 1 x Per Week/30 Days Discharge Instructions: double D Electronic Signature(s) Unsigned Entered By: Betha Loa on 07/19/2023 14:39:09 Signature(s): JARYD, DREW ([id number]) [phone] Date(s): [id number].pdf Page 7 of 13 -------------------------------------------------------------------------------- Problem List Details Patient Name: Date of Service: [name] 07/19/2023 1:45 PM Medical Record  Number: [id number] Patient Account Number: [id number] Date of Birth/Sex: Treating RN: 1946/06/24 (77 y.o. [name] Petit) [name] Primary Care Provider: [name] Other Clinician: Betha Loa Referring Provider: Treating Provider/Extender: [name] in Treatment: 19 Active Problems ICD-10 Encounter Code Description Active Date MDM Diagnosis E11.621 Type 2 diabetes mellitus with foot ulcer 06/19/2022 No Yes L97.522 Non-pressure chronic ulcer of other part of left foot with fat layer exposed 06/19/2022 No Yes E11.42 Type 2 diabetes mellitus with diabetic polyneuropathy 06/19/2022 No Yes Inactive Problems Resolved Problems Electronic Signature(s) Signed: 07/19/2023 2:01:03 PM By: [name] Derry PA-C Entered By: [name] Derry on 07/19/2023 14:01:03 -------------------------------------------------------------------------------- Progress Note Details Patient Name: Date of Service: [name], [name] [name] 07/19/2023 1:45 PM Medical Record Number: [id number] Patient Account Number: [id number] Date of Birth/Sex: Treating RN: Jan 22, 1946 (77 y.o. [name] Petit) [name] Primary Care Provider: [name] Other Clinician: Betha Loa Referring Provider: Treating Provider/Extender: [name] in Treatment: 56 Subjective Chief Complaint Information obtained from Patient [name], [name] ([id number]) [id number] Page 8 of 13 Left heel ulcer History of Present Illness (HPI) 09/24/2020 on evaluation today patient presents today for a heel ulcer that he tells me has been present for about 2 years. He has been seeing podiatry and they have been attempting to manage this including what sounds to be a total contact cast, Unna boot, and just standard dressings otherwise as well. Most recently has been using triple antibiotic ointment. With that being said unfortunately despite everything he really has not had any significant improvement.  He tells me that he cannot even really remember exactly how this began but he presumed it may have rubbed on his shoes or something of that nature. 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 He tells me that this does seem to help. Fortunately there is no signs of active infection at this time. 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 Bathing/ Shower/ Hygiene May shower; gently cleanse wound with antibacterial soap, rinse and pat dry prior to dressing wounds Additional Orders / Instructions Other: - apply Urea cream to heels at night Medications-Please add to medication list. ntibiotics - continue Bactrim DS P.O. A Wound Treatment Wound #10 - Calcaneus Wound Laterality: Left Peri-Wound Care: AandD Ointment 1 x Per Week/30 Days Discharge Instructions: Apply AandD Ointment to heels and legs in the mornings Secondary Dressing: ABD Pad 5x9 (in/in) 1 x Per Week/30 Days Discharge Instructions: Cover heal with ABD pad for protection Secured With: Medipore T - 33M Medipore H Soft Cloth Surgical T ape ape, 2x2 (in/yd) 1 x Per Week/30 Days Secured With: Conform 4'' - Conforming Stretch Gauze Bandage 4x75 (in/in) 1 x Per Week/30 Days Discharge Instructions: Apply as directed Secured With: Tubigrip Size D, 3x10 (in/yd) 1 x Per Week/30 Days Discharge Instructions: double D Electronic Signature(s) Unsigned Entered By: Betha Loa on 07/19/2023 14:39:09 Signature(s): JARYD, DREW ([id number]) [phone] Date(s): [id number].pdf Page 7 of 13 -------------------------------------------------------------------------------- Problem List Details Patient Name: Date of Service: [name] 07/19/2023 1:45 PM Medical Record  Number: [id number] Patient Account Number: [id number] Date of Birth/Sex: Treating RN: 1946/06/24 (77 y.o. [name] Petit) [name] Primary Care Provider: [name] Other Clinician: Betha Loa Referring Provider: Treating Provider/Extender: [name] in Treatment: 19 Active Problems ICD-10 Encounter Code Description Active Date MDM Diagnosis E11.621 Type 2 diabetes mellitus with foot ulcer 06/19/2022 No Yes L97.522 Non-pressure chronic ulcer of other part of left foot with fat layer exposed 06/19/2022 No Yes E11.42 Type 2 diabetes mellitus with diabetic polyneuropathy 06/19/2022 No Yes Inactive Problems Resolved Problems Electronic Signature(s) Signed: 07/19/2023 2:01:03 PM By: [name] Derry PA-C Entered By: [name] Derry on 07/19/2023 14:01:03 -------------------------------------------------------------------------------- Progress Note Details Patient Name: Date of Service: [name], [name] [name] 07/19/2023 1:45 PM Medical Record Number: [id number] Patient Account Number: [id number] Date of Birth/Sex: Treating RN: Jan 22, 1946 (77 y.o. [name] Petit) [name] Primary Care Provider: [name] Other Clinician: Betha Loa Referring Provider: Treating Provider/Extender: [name] in Treatment: 56 Subjective Chief Complaint Information obtained from Patient [name], [name] ([id number]) [id number] Page 8 of 13 Left heel ulcer History of Present Illness (HPI) 09/24/2020 on evaluation today patient presents today for a heel ulcer that he tells me has been present for about 2 years. He has been seeing podiatry and they have been attempting to manage this including what sounds to be a total contact cast, Unna boot, and just standard dressings otherwise as well. Most recently has been using triple antibiotic ointment. With that being said unfortunately despite everything he really has not had any significant improvement.  He tells me that he cannot even really remember exactly how this began but he presumed it may have rubbed on his shoes or something of that nature. 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 He tells me that this does seem to help. Fortunately there is no signs of active infection at this time. His most  diabetic. We have been using Hydrofera Blue under Urgo K2 lite wrap he has been in his surgical shoe although he tells me it disintegrated and he came in on a regular running shoe today. 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 He is not wearing compression stocking 06-22-2023 upon evaluation today patient appears to be doing excellent in regard to his wound on the heel which is actually significantly smaller compared to last week's evaluation. Fortunately I do not see any signs of active infection locally or systemically at this time. 06-28-2023 upon evaluation today patient appears to be doing well currently in regard to his heel which is actually showing signs of improvement. Fortunately I do not see any evidence of active infection at this time which is great news and in [name] I do believe that we are making headway towards complete closure which is good news. No fevers, chills, nausea, vomiting, or diarrhea. 07-05-2023 upon evaluation today patient appears to be doing well currently in regard to his heel ulcer. This is actually very close I think that being completely healed. This is great news and overall very pleased with where we stand today. 07-12-2023 upon evaluation today patient appears to be doing well currently in regard to his wound. He in fact appears to be very close to complete resolution I am very pleased that regard. However on the tip of his foot there appears to be some issue here with inflammation secondary to infection. I am very concerned about the infection at the site. 07-19-2023 upon evaluation today patient appears to actually be doing well in  regard to his heel. Fortunately I do not see any signs of worsening overall. In [name] I think you are making headway towards closure. Electronic Signature(s) Signed: 07/19/2023 3:02:10 PM By: [name] Derry PA-C Entered By: [name] Derry on 07/19/2023 15:02:10 -------------------------------------------------------------------------------- Physical Exam Details Patient Name: Date of Service: [name] [facility] J. 07/19/2023 1:45 PM Medical Record Number: [id number] Patient Account Number: [id number] Date of Birth/Sex: Treating RN: 1945/11/26 (77 y.o. [name] Primary Care Provider: [name] Other Clinician: Betha Loa Referring Provider: Treating Provider/Extender: Hermine Messick Weeks in Treatment: 57 Constitutional Well-nourished and well-hydrated in no acute distress. Respiratory normal breathing without difficulty. Psychiatric this patient is able to make decisions and demonstrates good insight into disease process. Alert and Oriented x 3. pleasant and cooperative. Notes Anything that looks to be obviously open I just want a monitor for 1 week allow him to be able to put some Upon inspection patient's wound bed actually showed signs of good granulation epithelization at this point. Fortunately I do not see lotion on the area and try to get this nice and soft and loosen up so we will see how things look come next week. He is in agreement with that plan. Electronic Signature(s) Signed: 07/19/2023 3:02:34 PM By: Ricarda Frame, [name] Lunger ([id number]) [id number] Page 6 of 13 Entered By: [name] Derry on 07/19/2023 15:02:34 -------------------------------------------------------------------------------- Physician Orders Details Patient Name: Date of Service: [name] [facility] 07/19/2023 1:45 PM Medical Record Number: [id number] Patient Account Number: [id number] Date of Birth/Sex: Treating RN: Apr 13, 1946 (77 y.o. 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 Bathing/ Shower/ Hygiene May shower; gently cleanse wound with antibacterial soap, rinse and pat dry prior to dressing wounds Additional Orders / Instructions Other: - apply Urea cream to heels at night Medications-Please add to medication list. ntibiotics - continue Bactrim DS P.O. A Wound Treatment Wound #10 - Calcaneus Wound Laterality: Left Peri-Wound Care: AandD Ointment 1 x Per Week/30 Days Discharge Instructions: Apply AandD Ointment to heels and legs in the mornings Secondary Dressing: ABD Pad 5x9 (in/in) 1 x Per Week/30 Days Discharge Instructions: Cover heal with ABD pad for protection Secured With: Medipore T - 33M Medipore H Soft Cloth Surgical T ape ape, 2x2 (in/yd) 1 x Per Week/30 Days Secured With: Conform 4'' - Conforming Stretch Gauze Bandage 4x75 (in/in) 1 x Per Week/30 Days Discharge Instructions: Apply as directed Secured With: Tubigrip Size D, 3x10 (in/yd) 1 x Per Week/30 Days Discharge Instructions: double D Electronic Signature(s) Unsigned Entered By: Betha Loa on 07/19/2023 14:39:09 Signature(s): JARYD, DREW ([id number]) [phone] Date(s): [id number].pdf Page 7 of 13 -------------------------------------------------------------------------------- Problem List Details Patient Name: Date of Service: [name] 07/19/2023 1:45 PM Medical Record  Number: [id number] Patient Account Number: [id number] Date of Birth/Sex: Treating RN: 1946/06/24 (77 y.o. [name] Petit) [name] Primary Care Provider: [name] Other Clinician: Betha Loa Referring Provider: Treating Provider/Extender: [name] in Treatment: 19 Active Problems ICD-10 Encounter Code Description Active Date MDM Diagnosis E11.621 Type 2 diabetes mellitus with foot ulcer 06/19/2022 No Yes L97.522 Non-pressure chronic ulcer of other part of left foot with fat layer exposed 06/19/2022 No Yes E11.42 Type 2 diabetes mellitus with diabetic polyneuropathy 06/19/2022 No Yes Inactive Problems Resolved Problems Electronic Signature(s) Signed: 07/19/2023 2:01:03 PM By: [name] Derry PA-C Entered By: [name] Derry on 07/19/2023 14:01:03 -------------------------------------------------------------------------------- Progress Note Details Patient Name: Date of Service: [name], [name] [name] 07/19/2023 1:45 PM Medical Record Number: [id number] Patient Account Number: [id number] Date of Birth/Sex: Treating RN: Jan 22, 1946 (77 y.o. [name] Petit) [name] Primary Care Provider: [name] Other Clinician: Betha Loa Referring Provider: Treating Provider/Extender: [name] in Treatment: 56 Subjective Chief Complaint Information obtained from Patient [name], [name] ([id number]) [id number] Page 8 of 13 Left heel ulcer History of Present Illness (HPI) 09/24/2020 on evaluation today patient presents today for a heel ulcer that he tells me has been present for about 2 years. He has been seeing podiatry and they have been attempting to manage this including what sounds to be a total contact cast, Unna boot, and just standard dressings otherwise as well. Most recently has been using triple antibiotic ointment. With that being said unfortunately despite everything he really has not had any significant improvement.  He tells me that he cannot even really remember exactly how this began but he presumed it may have rubbed on his shoes or something of that nature. 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 He tells me that this does seem to help. Fortunately there is no signs of active infection at this time. 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 Bathing/ Shower/ Hygiene May shower; gently cleanse wound with antibacterial soap, rinse and pat dry prior to dressing wounds Additional Orders / Instructions Other: - apply Urea cream to heels at night Medications-Please add to medication list. ntibiotics - continue Bactrim DS P.O. A Wound Treatment Wound #10 - Calcaneus Wound Laterality: Left Peri-Wound Care: AandD Ointment 1 x Per Week/30 Days Discharge Instructions: Apply AandD Ointment to heels and legs in the mornings Secondary Dressing: ABD Pad 5x9 (in/in) 1 x Per Week/30 Days Discharge Instructions: Cover heal with ABD pad for protection Secured With: Medipore T - 33M Medipore H Soft Cloth Surgical T ape ape, 2x2 (in/yd) 1 x Per Week/30 Days Secured With: Conform 4'' - Conforming Stretch Gauze Bandage 4x75 (in/in) 1 x Per Week/30 Days Discharge Instructions: Apply as directed Secured With: Tubigrip Size D, 3x10 (in/yd) 1 x Per Week/30 Days Discharge Instructions: double D Electronic Signature(s) Unsigned Entered By: Betha Loa on 07/19/2023 14:39:09 Signature(s): JARYD, DREW ([id number]) [phone] Date(s): [id number].pdf Page 7 of 13 -------------------------------------------------------------------------------- Problem List Details Patient Name: Date of Service: [name] 07/19/2023 1:45 PM Medical Record  Number: [id number] Patient Account Number: [id number] Date of Birth/Sex: Treating RN: 1946/06/24 (77 y.o. [name] Petit) [name] Primary Care Provider: [name] Other Clinician: Betha Loa Referring Provider: Treating Provider/Extender: [name] in Treatment: 19 Active Problems ICD-10 Encounter Code Description Active Date MDM Diagnosis E11.621 Type 2 diabetes mellitus with foot ulcer 06/19/2022 No Yes L97.522 Non-pressure chronic ulcer of other part of left foot with fat layer exposed 06/19/2022 No Yes E11.42 Type 2 diabetes mellitus with diabetic polyneuropathy 06/19/2022 No Yes Inactive Problems Resolved Problems Electronic Signature(s) Signed: 07/19/2023 2:01:03 PM By: [name] Derry PA-C Entered By: [name] Derry on 07/19/2023 14:01:03 -------------------------------------------------------------------------------- Progress Note Details Patient Name: Date of Service: [name], [name] [name] 07/19/2023 1:45 PM Medical Record Number: [id number] Patient Account Number: [id number] Date of Birth/Sex: Treating RN: Jan 22, 1946 (77 y.o. [name] Petit) [name] Primary Care Provider: [name] Other Clinician: Betha Loa Referring Provider: Treating Provider/Extender: [name] in Treatment: 56 Subjective Chief Complaint Information obtained from Patient [name], [name] ([id number]) [id number] Page 8 of 13 Left heel ulcer History of Present Illness (HPI) 09/24/2020 on evaluation today patient presents today for a heel ulcer that he tells me has been present for about 2 years. He has been seeing podiatry and they have been attempting to manage this including what sounds to be a total contact cast, Unna boot, and just standard dressings otherwise as well. Most recently has been using triple antibiotic ointment. With that being said unfortunately despite everything he really has not had any significant improvement.  He tells me that he cannot even really remember exactly how this began but he presumed it may have rubbed on his shoes or something of that nature. 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 He tells me that this does seem to help. Fortunately there is no signs of active infection at this time. His most  to make decisions and demonstrates good insight into disease process. Alert and Oriented x 3. pleasant and cooperative. [name] Notes: Anything that looks to be obviously open I just want a monitor for 1 week allow him to be able to put some Upon inspection patient's wound bed actually showed signs of good granulation epithelization at this point. Fortunately I do not see lotion on the area and try to get this nice and soft and loosen up so we will see how things look come next week. He is in agreement with that plan. Integumentary (Hair, Skin) Wound #10 status is Open. Original cause of wound was Gradually Appeared. The date acquired was: 06/05/2022. The wound has been in treatment 56 weeks. The wound is located on the Left Calcaneus. The wound measures 0.1cm length x 0.1cm width x 0.1cm depth; 0.008cm^2 area and 0.001cm^3 volume. There is Fat Layer (Subcutaneous Tissue) exposed. There is a medium amount of serosanguineous drainage noted. The wound margin is flat and intact. There is small (1-33%) pink granulation within the wound bed. There is a small (1-33%) amount of necrotic tissue within the wound bed including Adherent Slough. AHMAR, PICKRELL ([id number]) [id number] Page 12 of 13 Assessment Active Problems ICD-10 Type 2 diabetes mellitus with foot ulcer Non-pressure chronic  ulcer of other part of left foot with fat layer exposed Type 2 diabetes mellitus with diabetic polyneuropathy Plan Follow-up Appointments: Wound #10 Left Calcaneus: Return Appointment in 1 week. Bathing/ Shower/ Hygiene: May shower; gently cleanse wound with antibacterial soap, rinse and pat dry prior to dressing wounds Additional Orders / Instructions: Other: - apply Urea cream to heels at night Medications-Please add to medication list.: P.O. Antibiotics - continue Bactrim DS WOUND #10: - Calcaneus Wound Laterality: Left Peri-Wound Care: AandD Ointment 1 x Per Week/30 Days Discharge Instructions: Apply AandD Ointment to heels and legs in the mornings Secondary Dressing: ABD Pad 5x9 (in/in) 1 x Per Week/30 Days Discharge Instructions: Cover heal with ABD pad for protection Secured With: Medipore T - 61M Medipore H Soft Cloth Surgical T ape ape, 2x2 (in/yd) 1 x Per Week/30 Days Secured With: Conform 4'' - Conforming Stretch Gauze Bandage 4x75 (in/in) 1 x Per Week/30 Days Discharge Instructions: Apply as directed Secured With: Tubigrip Size D, 3x10 (in/yd) 1 x Per Week/30 Days Discharge Instructions: double D 1. I am going to not wrap him this week so that he can actually apply lotion twice a day is good be using the 40% urea cream at night and in the morning he will be put on the AandD ointment. 2. I would recommend as well that the patient should continue to monitor for any signs of infection or worsening if anything changes he knows to contact the office and let me know. We will see patient back for reevaluation in 1 week here in the clinic. If anything worsens or changes patient will contact our office for additional recommendations. Electronic Signature(s) Signed: 07/19/2023 3:02:56 PM By: [name] Derry PA-C Entered By: [name] Derry on 07/19/2023 15:02:56 -------------------------------------------------------------------------------- SuperBill Details Patient Name: Date of  Service: [name], [name] [name] 07/19/2023 Medical Record Number: [id number] Patient Account Number: [id number] Date of Birth/Sex: Treating RN: 01-Apr-1946 (77 y.o. [name] Petit) [name] Primary Care Provider: [name] Other Clinician: Betha Loa Referring Provider: Treating Provider/Extender: Hermine Messick Weeks in Treatment: 56 Diagnosis Coding ICD-10 Codes Code Description [name], [name] ([id number]) [id number] Page 13 of 13 E11.621 Type 2 diabetes mellitus with foot ulcer L97.522 Non-pressure chronic ulcer of  diabetic. We have been using Hydrofera Blue under Urgo K2 lite wrap he has been in his surgical shoe although he tells me it disintegrated and he came in on a regular running shoe today. 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 He is not wearing compression stocking 06-22-2023 upon evaluation today patient appears to be doing excellent in regard to his wound on the heel which is actually significantly smaller compared to last week's evaluation. Fortunately I do not see any signs of active infection locally or systemically at this time. 06-28-2023 upon evaluation today patient appears to be doing well currently in regard to his heel which is actually showing signs of improvement. Fortunately I do not see any evidence of active infection at this time which is great news and in [name] I do believe that we are making headway towards complete closure which is good news. No fevers, chills, nausea, vomiting, or diarrhea. 07-05-2023 upon evaluation today patient appears to be doing well currently in regard to his heel ulcer. This is actually very close I think that being completely healed. This is great news and overall very pleased with where we stand today. 07-12-2023 upon evaluation today patient appears to be doing well currently in regard to his wound. He in fact appears to be very close to complete resolution I am very pleased that regard. However on the tip of his foot there appears to be some issue here with inflammation secondary to infection. I am very concerned about the infection at the site. 07-19-2023 upon evaluation today patient appears to actually be doing well in  regard to his heel. Fortunately I do not see any signs of worsening overall. In [name] I think you are making headway towards closure. Electronic Signature(s) Signed: 07/19/2023 3:02:10 PM By: [name] Derry PA-C Entered By: [name] Derry on 07/19/2023 15:02:10 -------------------------------------------------------------------------------- Physical Exam Details Patient Name: Date of Service: [name] [facility] J. 07/19/2023 1:45 PM Medical Record Number: [id number] Patient Account Number: [id number] Date of Birth/Sex: Treating RN: 1945/11/26 (77 y.o. [name] Primary Care Provider: [name] Other Clinician: Betha Loa Referring Provider: Treating Provider/Extender: Hermine Messick Weeks in Treatment: 57 Constitutional Well-nourished and well-hydrated in no acute distress. Respiratory normal breathing without difficulty. Psychiatric this patient is able to make decisions and demonstrates good insight into disease process. Alert and Oriented x 3. pleasant and cooperative. Notes Anything that looks to be obviously open I just want a monitor for 1 week allow him to be able to put some Upon inspection patient's wound bed actually showed signs of good granulation epithelization at this point. Fortunately I do not see lotion on the area and try to get this nice and soft and loosen up so we will see how things look come next week. He is in agreement with that plan. Electronic Signature(s) Signed: 07/19/2023 3:02:34 PM By: Ricarda Frame, [name] Lunger ([id number]) [id number] Page 6 of 13 Entered By: [name] Derry on 07/19/2023 15:02:34 -------------------------------------------------------------------------------- Physician Orders Details Patient Name: Date of Service: [name] [facility] 07/19/2023 1:45 PM Medical Record Number: [id number] Patient Account Number: [id number] Date of Birth/Sex: Treating RN: Apr 13, 1946 (77 y.o. 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 [name] Petit) [name] Primary Care Provider: [name] Other Clinician: Betha Loa Referring Provider: Treating Provider/Extender: [name] in Treatment: 24 Information Obtained from: Patient Chief Complaint Left heel ulcer Electronic Signature(s) Signed: 07/19/2023 2:01:08 PM By: [name] Derry PA-C Entered By: [name] Derry on 07/19/2023 14:01:08 -------------------------------------------------------------------------------- HPI Details Patient Name: Date of Service: [name], [name] [name] 07/19/2023 1:45 PM Medical Record Number: [id number] Patient Account Number: [id number] Date of Birth/Sex: Treating RN: 03-04-1946 (77 y.o. [name] Primary Care Provider: [name] Other Clinician: Betha Loa Referring Provider: Treating Provider/Extender: [name] in Treatment: 19 History of Present Illness HPI Description: 09/24/2020 on evaluation today patient presents today for a heel ulcer that he tells me has been present for about 2 years. He has been seeing podiatry and they have been attempting to manage this including what sounds to be a total contact cast, Unna boot, and just standard dressings otherwise as well. Most recently has been using triple antibiotic ointment. With that being said unfortunately despite everything he really has not had any significant improvement. He tells me that he cannot even really remember exactly how this began but he presumed it may have rubbed on his shoes or something of that nature. 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 He tells me that this does seem to help. Fortunately there is no signs of active infection at this time. His most recent hemoglobin A1c was 8.1 though he knows this was this year he cannot tell me the exact time. His fluid pills currently to help with some of the lower extremity edema although he does obviously have signs of venous stasis/lymphedema. Currently there is no evidence of active infection. No fevers, chills, nausea, vomiting, or diarrhea. Patient has had fairly recent ABIs which were performed on 07/19/2020 and revealed that he has normal findings in both the ankle and toe locations bilaterally. His ABI on the right was 1.09 on the left was 1.08 with a TBI on the right of 0.88 and on the left of 0.94. Triphasic flow was noted throughout. 10/08/2020 on evaluation today patient appears to be doing pretty well in regard to his left heel currently in fact this is doing a great job and seems to be healing quite nicely. Unfortunately on his right leg he had a pile of wood that actually fell on him injuring his right leg this is somewhat erythematous has me concerned little bit about cellulitis though there is not really a good area to culture at this point. [name], [name] ([id number]) [id number] Page 2 of 13 10/24/2020 upon evaluation today patient appears to be doing well with regard to his heel ulcer. He is showing signs of improvement which is great news. His right leg is completely healed. Overall I feel like he is doing excellent and there is no signs of infection. 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 Bathing/ Shower/ Hygiene May shower; gently cleanse wound with antibacterial soap, rinse and pat dry prior to dressing wounds Additional Orders / Instructions Other: - apply Urea cream to heels at night Medications-Please add to medication list. ntibiotics - continue Bactrim DS P.O. A Wound Treatment Wound #10 - Calcaneus Wound Laterality: Left Peri-Wound Care: AandD Ointment 1 x Per Week/30 Days Discharge Instructions: Apply AandD Ointment to heels and legs in the mornings Secondary Dressing: ABD Pad 5x9 (in/in) 1 x Per Week/30 Days Discharge Instructions: Cover heal with ABD pad for protection Secured With: Medipore T - 33M Medipore H Soft Cloth Surgical T ape ape, 2x2 (in/yd) 1 x Per Week/30 Days Secured With: Conform 4'' - Conforming Stretch Gauze Bandage 4x75 (in/in) 1 x Per Week/30 Days Discharge Instructions: Apply as directed Secured With: Tubigrip Size D, 3x10 (in/yd) 1 x Per Week/30 Days Discharge Instructions: double D Electronic Signature(s) Unsigned Entered By: Betha Loa on 07/19/2023 14:39:09 Signature(s): JARYD, DREW ([id number]) [phone] Date(s): [id number] Page 7 of 13 -------------------------------------------------------------------------------- Problem List Details Patient Name: Date of Service: [name] 07/19/2023 1:45 PM Medical Record  Number: [id number] Patient Account Number: [id number] Date of Birth/Sex: Treating RN: 1946/06/24 (77 y.o. [name] Petit) [name] Primary Care Provider: [name] Other Clinician: Betha Loa Referring Provider: Treating Provider/Extender: [name] in Treatment: 19 Active Problems ICD-10 Encounter Code Description Active Date MDM Diagnosis E11.621 Type 2 diabetes mellitus with foot ulcer 06/19/2022 No Yes L97.522 Non-pressure chronic ulcer of other part of left foot with fat layer exposed 06/19/2022 No Yes E11.42 Type 2 diabetes mellitus with diabetic polyneuropathy 06/19/2022 No Yes Inactive Problems Resolved Problems Electronic Signature(s) Signed: 07/19/2023 2:01:03 PM By: [name] Derry PA-C Entered By: [name] Derry on 07/19/2023 14:01:03 -------------------------------------------------------------------------------- Progress Note Details Patient Name: Date of Service: [name], [name] [name] 07/19/2023 1:45 PM Medical Record Number: [id number] Patient Account Number: [id number] Date of Birth/Sex: Treating RN: Jan 22, 1946 (77 y.o. [name] Petit) [name] Primary Care Provider: [name] Other Clinician: Betha Loa Referring Provider: Treating Provider/Extender: [name] in Treatment: 56 Subjective Chief Complaint Information obtained from Patient [name], [name] ([id number]) [id number] Page 8 of 13 Left heel ulcer History of Present Illness (HPI) 09/24/2020 on evaluation today patient presents today for a heel ulcer that he tells me has been present for about 2 years. He has been seeing podiatry and they have been attempting to manage this including what sounds to be a total contact cast, Unna boot, and just standard dressings otherwise as well. Most recently has been using triple antibiotic ointment. With that being said unfortunately despite everything he really has not had any significant improvement.  He tells me that he cannot even really remember exactly how this began but he presumed it may have rubbed on his shoes or something of that nature. 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 He tells me that this does seem to help. Fortunately there is no signs of active infection at this time. His most  to make decisions and demonstrates good insight into disease process. Alert and Oriented x 3. pleasant and cooperative. [name] Notes: Anything that looks to be obviously open I just want a monitor for 1 week allow him to be able to put some Upon inspection patient's wound bed actually showed signs of good granulation epithelization at this point. Fortunately I do not see lotion on the area and try to get this nice and soft and loosen up so we will see how things look come next week. He is in agreement with that plan. Integumentary (Hair, Skin) Wound #10 status is Open. Original cause of wound was Gradually Appeared. The date acquired was: 06/05/2022. The wound has been in treatment 56 weeks. The wound is located on the Left Calcaneus. The wound measures 0.1cm length x 0.1cm width x 0.1cm depth; 0.008cm^2 area and 0.001cm^3 volume. There is Fat Layer (Subcutaneous Tissue) exposed. There is a medium amount of serosanguineous drainage noted. The wound margin is flat and intact. There is small (1-33%) pink granulation within the wound bed. There is a small (1-33%) amount of necrotic tissue within the wound bed including Adherent Slough. AHMAR, PICKRELL ([id number]) [id number] Page 12 of 13 Assessment Active Problems ICD-10 Type 2 diabetes mellitus with foot ulcer Non-pressure chronic  ulcer of other part of left foot with fat layer exposed Type 2 diabetes mellitus with diabetic polyneuropathy Plan Follow-up Appointments: Wound #10 Left Calcaneus: Return Appointment in 1 week. Bathing/ Shower/ Hygiene: May shower; gently cleanse wound with antibacterial soap, rinse and pat dry prior to dressing wounds Additional Orders / Instructions: Other: - apply Urea cream to heels at night Medications-Please add to medication list.: P.O. Antibiotics - continue Bactrim DS WOUND #10: - Calcaneus Wound Laterality: Left Peri-Wound Care: AandD Ointment 1 x Per Week/30 Days Discharge Instructions: Apply AandD Ointment to heels and legs in the mornings Secondary Dressing: ABD Pad 5x9 (in/in) 1 x Per Week/30 Days Discharge Instructions: Cover heal with ABD pad for protection Secured With: Medipore T - 61M Medipore H Soft Cloth Surgical T ape ape, 2x2 (in/yd) 1 x Per Week/30 Days Secured With: Conform 4'' - Conforming Stretch Gauze Bandage 4x75 (in/in) 1 x Per Week/30 Days Discharge Instructions: Apply as directed Secured With: Tubigrip Size D, 3x10 (in/yd) 1 x Per Week/30 Days Discharge Instructions: double D 1. I am going to not wrap him this week so that he can actually apply lotion twice a day is good be using the 40% urea cream at night and in the morning he will be put on the AandD ointment. 2. I would recommend as well that the patient should continue to monitor for any signs of infection or worsening if anything changes he knows to contact the office and let me know. We will see patient back for reevaluation in 1 week here in the clinic. If anything worsens or changes patient will contact our office for additional recommendations. Electronic Signature(s) Signed: 07/19/2023 3:02:56 PM By: [name] Derry PA-C Entered By: [name] Derry on 07/19/2023 15:02:56 -------------------------------------------------------------------------------- SuperBill Details Patient Name: Date of  Service: [name], [name] [name] 07/19/2023 Medical Record Number: [id number] Patient Account Number: [id number] Date of Birth/Sex: Treating RN: 01-Apr-1946 (77 y.o. [name] Petit) [name] Primary Care Provider: [name] Other Clinician: Betha Loa Referring Provider: Treating Provider/Extender: Hermine Messick Weeks in Treatment: 56 Diagnosis Coding ICD-10 Codes Code Description [name], [name] ([id number]) [id number] Page 13 of 13 E11.621 Type 2 diabetes mellitus with foot ulcer L97.522 Non-pressure chronic ulcer of  [name], [name] ([id number]) [id number] Page 1 of 13 Visit Report for 07/19/2023 Chief Complaint Document Details Patient Name: Date of Service: [name] [facility] 07/19/2023 1:45 PM Medical Record Number: [id number] Patient Account Number: [id number] Date of Birth/Sex: Treating RN: 11/18/1945 (77 y.o. [name] Petit) [name] Primary Care Provider: [name] Other Clinician: Betha Loa Referring Provider: Treating Provider/Extender: [name] in Treatment: 24 Information Obtained from: Patient Chief Complaint Left heel ulcer Electronic Signature(s) Signed: 07/19/2023 2:01:08 PM By: [name] Derry PA-C Entered By: [name] Derry on 07/19/2023 14:01:08 -------------------------------------------------------------------------------- HPI Details Patient Name: Date of Service: [name], [name] [name] 07/19/2023 1:45 PM Medical Record Number: [id number] Patient Account Number: [id number] Date of Birth/Sex: Treating RN: 03-04-1946 (77 y.o. [name] Primary Care Provider: [name] Other Clinician: Betha Loa Referring Provider: Treating Provider/Extender: [name] in Treatment: 19 History of Present Illness HPI Description: 09/24/2020 on evaluation today patient presents today for a heel ulcer that he tells me has been present for about 2 years. He has been seeing podiatry and they have been attempting to manage this including what sounds to be a total contact cast, Unna boot, and just standard dressings otherwise as well. Most recently has been using triple antibiotic ointment. With that being said unfortunately despite everything he really has not had any significant improvement. He tells me that he cannot even really remember exactly how this began but he presumed it may have rubbed on his shoes or something of that nature. 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 He tells me that this does seem to help. Fortunately there is no signs of active infection at this time. His most recent hemoglobin A1c was 8.1 though he knows this was this year he cannot tell me the exact time. His fluid pills currently to help with some of the lower extremity edema although he does obviously have signs of venous stasis/lymphedema. Currently there is no evidence of active infection. No fevers, chills, nausea, vomiting, or diarrhea. Patient has had fairly recent ABIs which were performed on 07/19/2020 and revealed that he has normal findings in both the ankle and toe locations bilaterally. His ABI on the right was 1.09 on the left was 1.08 with a TBI on the right of 0.88 and on the left of 0.94. Triphasic flow was noted throughout. 10/08/2020 on evaluation today patient appears to be doing pretty well in regard to his left heel currently in fact this is doing a great job and seems to be healing quite nicely. Unfortunately on his right leg he had a pile of wood that actually fell on him injuring his right leg this is somewhat erythematous has me concerned little bit about cellulitis though there is not really a good area to culture at this point. [name], [name] ([id number]) [id number] Page 2 of 13 10/24/2020 upon evaluation today patient appears to be doing well with regard to his heel ulcer. He is showing signs of improvement which is great news. His right leg is completely healed. Overall I feel like he is doing excellent and there is no signs of infection. 11/07/2020 upon evaluation today patient appears to be doing well with regard to  to make decisions and demonstrates good insight into disease process. Alert and Oriented x 3. pleasant and cooperative. [name] Notes: Anything that looks to be obviously open I just want a monitor for 1 week allow him to be able to put some Upon inspection patient's wound bed actually showed signs of good granulation epithelization at this point. Fortunately I do not see lotion on the area and try to get this nice and soft and loosen up so we will see how things look come next week. He is in agreement with that plan. Integumentary (Hair, Skin) Wound #10 status is Open. Original cause of wound was Gradually Appeared. The date acquired was: 06/05/2022. The wound has been in treatment 56 weeks. The wound is located on the Left Calcaneus. The wound measures 0.1cm length x 0.1cm width x 0.1cm depth; 0.008cm^2 area and 0.001cm^3 volume. There is Fat Layer (Subcutaneous Tissue) exposed. There is a medium amount of serosanguineous drainage noted. The wound margin is flat and intact. There is small (1-33%) pink granulation within the wound bed. There is a small (1-33%) amount of necrotic tissue within the wound bed including Adherent Slough. AHMAR, PICKRELL ([id number]) [id number] Page 12 of 13 Assessment Active Problems ICD-10 Type 2 diabetes mellitus with foot ulcer Non-pressure chronic  ulcer of other part of left foot with fat layer exposed Type 2 diabetes mellitus with diabetic polyneuropathy Plan Follow-up Appointments: Wound #10 Left Calcaneus: Return Appointment in 1 week. Bathing/ Shower/ Hygiene: May shower; gently cleanse wound with antibacterial soap, rinse and pat dry prior to dressing wounds Additional Orders / Instructions: Other: - apply Urea cream to heels at night Medications-Please add to medication list.: P.O. Antibiotics - continue Bactrim DS WOUND #10: - Calcaneus Wound Laterality: Left Peri-Wound Care: AandD Ointment 1 x Per Week/30 Days Discharge Instructions: Apply AandD Ointment to heels and legs in the mornings Secondary Dressing: ABD Pad 5x9 (in/in) 1 x Per Week/30 Days Discharge Instructions: Cover heal with ABD pad for protection Secured With: Medipore T - 61M Medipore H Soft Cloth Surgical T ape ape, 2x2 (in/yd) 1 x Per Week/30 Days Secured With: Conform 4'' - Conforming Stretch Gauze Bandage 4x75 (in/in) 1 x Per Week/30 Days Discharge Instructions: Apply as directed Secured With: Tubigrip Size D, 3x10 (in/yd) 1 x Per Week/30 Days Discharge Instructions: double D 1. I am going to not wrap him this week so that he can actually apply lotion twice a day is good be using the 40% urea cream at night and in the morning he will be put on the AandD ointment. 2. I would recommend as well that the patient should continue to monitor for any signs of infection or worsening if anything changes he knows to contact the office and let me know. We will see patient back for reevaluation in 1 week here in the clinic. If anything worsens or changes patient will contact our office for additional recommendations. Electronic Signature(s) Signed: 07/19/2023 3:02:56 PM By: [name] Derry PA-C Entered By: [name] Derry on 07/19/2023 15:02:56 -------------------------------------------------------------------------------- SuperBill Details Patient Name: Date of  Service: [name], [name] [name] 07/19/2023 Medical Record Number: [id number] Patient Account Number: [id number] Date of Birth/Sex: Treating RN: 01-Apr-1946 (77 y.o. [name] Petit) [name] Primary Care Provider: [name] Other Clinician: Betha Loa Referring Provider: Treating Provider/Extender: Hermine Messick Weeks in Treatment: 56 Diagnosis Coding ICD-10 Codes Code Description [name], [name] ([id number]) [id number] Page 13 of 13 E11.621 Type 2 diabetes mellitus with foot ulcer L97.522 Non-pressure chronic ulcer of  diabetic. We have been using Hydrofera Blue under Urgo K2 lite wrap he has been in his surgical shoe although he tells me it disintegrated and he came in on a regular running shoe today. 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 He is not wearing compression stocking 06-22-2023 upon evaluation today patient appears to be doing excellent in regard to his wound on the heel which is actually significantly smaller compared to last week's evaluation. Fortunately I do not see any signs of active infection locally or systemically at this time. 06-28-2023 upon evaluation today patient appears to be doing well currently in regard to his heel which is actually showing signs of improvement. Fortunately I do not see any evidence of active infection at this time which is great news and in [name] I do believe that we are making headway towards complete closure which is good news. No fevers, chills, nausea, vomiting, or diarrhea. 07-05-2023 upon evaluation today patient appears to be doing well currently in regard to his heel ulcer. This is actually very close I think that being completely healed. This is great news and overall very pleased with where we stand today. 07-12-2023 upon evaluation today patient appears to be doing well currently in regard to his wound. He in fact appears to be very close to complete resolution I am very pleased that regard. However on the tip of his foot there appears to be some issue here with inflammation secondary to infection. I am very concerned about the infection at the site. 07-19-2023 upon evaluation today patient appears to actually be doing well in  regard to his heel. Fortunately I do not see any signs of worsening overall. In [name] I think you are making headway towards closure. Electronic Signature(s) Signed: 07/19/2023 3:02:10 PM By: [name] Derry PA-C Entered By: [name] Derry on 07/19/2023 15:02:10 -------------------------------------------------------------------------------- Physical Exam Details Patient Name: Date of Service: [name] [facility] J. 07/19/2023 1:45 PM Medical Record Number: [id number] Patient Account Number: [id number] Date of Birth/Sex: Treating RN: 1945/11/26 (77 y.o. [name] Primary Care Provider: [name] Other Clinician: Betha Loa Referring Provider: Treating Provider/Extender: Hermine Messick Weeks in Treatment: 57 Constitutional Well-nourished and well-hydrated in no acute distress. Respiratory normal breathing without difficulty. Psychiatric this patient is able to make decisions and demonstrates good insight into disease process. Alert and Oriented x 3. pleasant and cooperative. Notes Anything that looks to be obviously open I just want a monitor for 1 week allow him to be able to put some Upon inspection patient's wound bed actually showed signs of good granulation epithelization at this point. Fortunately I do not see lotion on the area and try to get this nice and soft and loosen up so we will see how things look come next week. He is in agreement with that plan. Electronic Signature(s) Signed: 07/19/2023 3:02:34 PM By: Ricarda Frame, [name] Lunger ([id number]) [id number] Page 6 of 13 Entered By: [name] Derry on 07/19/2023 15:02:34 -------------------------------------------------------------------------------- Physician Orders Details Patient Name: Date of Service: [name] [facility] 07/19/2023 1:45 PM Medical Record Number: [id number] Patient Account Number: [id number] Date of Birth/Sex: Treating RN: Apr 13, 1946 (77 y.o. 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 We have been using Hydrofera Blue under Urgo K2 lite wrap he has been in his surgical shoe although he tells me it disintegrated and he came in on a regular running shoe today. 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 He is not wearing compression stocking 06-22-2023 upon evaluation today patient appears to be doing excellent in regard to his wound on the heel which is actually significantly smaller compared to last week's evaluation. Fortunately I do not see any signs of active infection locally or systemically at this time. 06-28-2023 upon evaluation today patient appears to be doing well currently in regard to his heel which is actually showing signs of improvement. Fortunately I do not see any evidence of active infection at this time which is great news and in [name] I do believe that we are making headway towards complete closure which is good news. No fevers, chills, nausea, vomiting, or diarrhea. 07-05-2023 upon evaluation today patient appears to be doing well currently in regard to his heel ulcer. This is actually very close I think that being completely healed. This is great news and overall very pleased with where we stand today. 07-12-2023 upon evaluation today patient appears to be doing well currently in regard to his wound. He in fact appears to be very close to complete resolution I am very pleased that regard. However on the tip of his foot there appears to be some issue here with inflammation secondary to infection. I am very concerned about the infection at the site. 07-19-2023 upon evaluation today patient appears to actually be doing well in  regard to his heel. Fortunately I do not see any signs of worsening overall. In [name] I think you are making headway towards closure. Electronic Signature(s) Signed: 07/19/2023 3:02:10 PM By: [name] Derry PA-C Entered By: [name] Derry on 07/19/2023 15:02:10 -------------------------------------------------------------------------------- Physical Exam Details Patient Name: Date of Service: [name] [facility] J. 07/19/2023 1:45 PM Medical Record Number: [id number] Patient Account Number: [id number] Date of Birth/Sex: Treating RN: 1945/11/26 (77 y.o. [name] Primary Care Provider: [name] Other Clinician: Betha Loa Referring Provider: Treating Provider/Extender: Hermine Messick Weeks in Treatment: 57 Constitutional Well-nourished and well-hydrated in no acute distress. Respiratory normal breathing without difficulty. Psychiatric this patient is able to make decisions and demonstrates good insight into disease process. Alert and Oriented x 3. pleasant and cooperative. Notes Anything that looks to be obviously open I just want a monitor for 1 week allow him to be able to put some Upon inspection patient's wound bed actually showed signs of good granulation epithelization at this point. Fortunately I do not see lotion on the area and try to get this nice and soft and loosen up so we will see how things look come next week. He is in agreement with that plan. Electronic Signature(s) Signed: 07/19/2023 3:02:34 PM By: Ricarda Frame, [name] Lunger ([id number]) [id number] Page 6 of 13 Entered By: [name] Derry on 07/19/2023 15:02:34 -------------------------------------------------------------------------------- Physician Orders Details Patient Name: Date of Service: [name] [facility] 07/19/2023 1:45 PM Medical Record Number: [id number] Patient Account Number: [id number] Date of Birth/Sex: Treating RN: Apr 13, 1946 (77 y.o. 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 [name] Petit) [name] Primary Care Provider: [name] Other Clinician: Betha Loa Referring Provider: Treating Provider/Extender: [name] in Treatment: 24 Information Obtained from: Patient Chief Complaint Left heel ulcer Electronic Signature(s) Signed: 07/19/2023 2:01:08 PM By: [name] Derry PA-C Entered By: [name] Derry on 07/19/2023 14:01:08 -------------------------------------------------------------------------------- HPI Details Patient Name: Date of Service: [name], [name] [name] 07/19/2023 1:45 PM Medical Record Number: [id number] Patient Account Number: [id number] Date of Birth/Sex: Treating RN: 03-04-1946 (77 y.o. [name] Primary Care Provider: [name] Other Clinician: Betha Loa Referring Provider: Treating Provider/Extender: [name] in Treatment: 19 History of Present Illness HPI Description: 09/24/2020 on evaluation today patient presents today for a heel ulcer that he tells me has been present for about 2 years. He has been seeing podiatry and they have been attempting to manage this including what sounds to be a total contact cast, Unna boot, and just standard dressings otherwise as well. Most recently has been using triple antibiotic ointment. With that being said unfortunately despite everything he really has not had any significant improvement. He tells me that he cannot even really remember exactly how this began but he presumed it may have rubbed on his shoes or something of that nature. 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 He tells me that this does seem to help. Fortunately there is no signs of active infection at this time. His most recent hemoglobin A1c was 8.1 though he knows this was this year he cannot tell me the exact time. His fluid pills currently to help with some of the lower extremity edema although he does obviously have signs of venous stasis/lymphedema. Currently there is no evidence of active infection. No fevers, chills, nausea, vomiting, or diarrhea. Patient has had fairly recent ABIs which were performed on 07/19/2020 and revealed that he has normal findings in both the ankle and toe locations bilaterally. His ABI on the right was 1.09 on the left was 1.08 with a TBI on the right of 0.88 and on the left of 0.94. Triphasic flow was noted throughout. 10/08/2020 on evaluation today patient appears to be doing pretty well in regard to his left heel currently in fact this is doing a great job and seems to be healing quite nicely. Unfortunately on his right leg he had a pile of wood that actually fell on him injuring his right leg this is somewhat erythematous has me concerned little bit about cellulitis though there is not really a good area to culture at this point. [name], [name] ([id number]) [id number] Page 2 of 13 10/24/2020 upon evaluation today patient appears to be doing well with regard to his heel ulcer. He is showing signs of improvement which is great news. His right leg is completely healed. Overall I feel like he is doing excellent and there is no signs of infection. 11/07/2020 upon evaluation today patient appears to be doing well with regard to  [name], [name] ([id number]) [id number] Page 1 of 13 Visit Report for 07/19/2023 Chief Complaint Document Details Patient Name: Date of Service: [name] [facility] 07/19/2023 1:45 PM Medical Record Number: [id number] Patient Account Number: [id number] Date of Birth/Sex: Treating RN: 11/18/1945 (77 y.o. [name] Petit) [name] Primary Care Provider: [name] Other Clinician: Betha Loa Referring Provider: Treating Provider/Extender: [name] in Treatment: 24 Information Obtained from: Patient Chief Complaint Left heel ulcer Electronic Signature(s) Signed: 07/19/2023 2:01:08 PM By: [name] Derry PA-C Entered By: [name] Derry on 07/19/2023 14:01:08 -------------------------------------------------------------------------------- HPI Details Patient Name: Date of Service: [name], [name] [name] 07/19/2023 1:45 PM Medical Record Number: [id number] Patient Account Number: [id number] Date of Birth/Sex: Treating RN: 03-04-1946 (77 y.o. [name] Primary Care Provider: [name] Other Clinician: Betha Loa Referring Provider: Treating Provider/Extender: [name] in Treatment: 19 History of Present Illness HPI Description: 09/24/2020 on evaluation today patient presents today for a heel ulcer that he tells me has been present for about 2 years. He has been seeing podiatry and they have been attempting to manage this including what sounds to be a total contact cast, Unna boot, and just standard dressings otherwise as well. Most recently has been using triple antibiotic ointment. With that being said unfortunately despite everything he really has not had any significant improvement. He tells me that he cannot even really remember exactly how this began but he presumed it may have rubbed on his shoes or something of that nature. 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 He tells me that this does seem to help. Fortunately there is no signs of active infection at this time. His most recent hemoglobin A1c was 8.1 though he knows this was this year he cannot tell me the exact time. His fluid pills currently to help with some of the lower extremity edema although he does obviously have signs of venous stasis/lymphedema. Currently there is no evidence of active infection. No fevers, chills, nausea, vomiting, or diarrhea. Patient has had fairly recent ABIs which were performed on 07/19/2020 and revealed that he has normal findings in both the ankle and toe locations bilaterally. His ABI on the right was 1.09 on the left was 1.08 with a TBI on the right of 0.88 and on the left of 0.94. Triphasic flow was noted throughout. 10/08/2020 on evaluation today patient appears to be doing pretty well in regard to his left heel currently in fact this is doing a great job and seems to be healing quite nicely. Unfortunately on his right leg he had a pile of wood that actually fell on him injuring his right leg this is somewhat erythematous has me concerned little bit about cellulitis though there is not really a good area to culture at this point. [name], [name] ([id number]) [id number] Page 2 of 13 10/24/2020 upon evaluation today patient appears to be doing well with regard to his heel ulcer. He is showing signs of improvement which is great news. His right leg is completely healed. Overall I feel like he is doing excellent and there is no signs of infection. 11/07/2020 upon evaluation today patient appears to be doing well with regard to

## 2023-07-20 ENCOUNTER — Telehealth: Payer: Self-pay

## 2023-07-20 NOTE — Telephone Encounter (Signed)
Pharmacy Patient Advocate Encounter   Received notification from Fax that prior authorization for Pregabalin 150mg  is required/requested.   Insurance verification completed.   The patient is insured through [employer] .   Per test claim: PA required; PA submitted to [facility] via Fax Key/confirmation #/[id number] Status is pending

## 2023-07-20 NOTE — Progress Notes (Signed)
Wound Location: Gradually Appeared N/A N/A Wounding 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 N/A N/A Secondary Etiology: Cataracts, Arrhythmia, Coronary N/A N/A Comorbid History: Artery Disease, Hypertension, Type II Diabetes, Osteoarthritis, Neuropathy 06/05/2022 N/A N/A Date Acquired: 40 N/A N/A Weeks of Treatment: Open N/A N/A Wound Status: No N/A N/A Wound Recurrence: 0.1x0.1x0.1 N/A N/A Measurements L x W x D (cm) 0.008 N/A N/A A (cm) : rea 0.001 N/A N/A Volume (cm) : 99.40% N/A N/A % Reduction in A rea: 99.60% N/A N/A % Reduction in Volume: Grade 1 N/A N/A Classification: Medium N/A N/A Exudate A mount: Serosanguineous N/A N/A Exudate Type: red, brown N/A N/A Exudate Color: Flat and Intact N/A N/A Wound Margin: Small (1-33%) N/A N/A Granulation A mount: Pink N/A N/A Granulation Quality: Small (1-33%) N/A N/A Necrotic A mount: Fat Layer (Subcutaneous Tissue): Yes N/A N/A Exposed Structures: Fascia: No Tendon: No Muscle: No Joint: No Bone: No Medium (34-66%) N/A N/A Epithelialization: Treatment Notes Electronic Signature(s) Signed: 07/20/2023 8:28:10 AM By: Betha Loa Entered By: Betha Loa on 07/19/2023  10:56:20 -------------------------------------------------------------------------------- Multi-Disciplinary Care Plan Details Patient Name: Date of Service: [name], [name] [name] 07/19/2023 1:45 PM Medical Record Number: [id number] Patient Account Number: [id number] Date of Birth/Sex: Treating RN: [birth year] (77 y.o. [name]) [name] Primary Care Margie Brink: [name] Other Clinician: Betha Loa Referring Makenzey Nanni: Treating Clessie Karras/Extender: [name] in Treatment: 56 Active Inactive Wound/Skin Impairment Nursing Diagnoses: Impaired tissue integrity Goals: Patient/caregiver will verbalize understanding of skin care regimen Date Initiated: 06/19/2022 Target Resolution Date: 07/19/2022 Goal Status: Active Ulcer/skin breakdown will have a volume reduction of 30% by week 4 Date Initiated: 06/19/2022 Date Inactivated: 07/28/2022 Target Resolution Date: 07/17/2022 [name], [name] ([id number]) [phone].pdf Page 6 of 9 Goal Status: Unmet Unmet Reason: infection Ulcer/skin breakdown will have a volume reduction of 50% by week 8 Date Initiated: 07/28/2022 Date Inactivated: 05/18/2023 Target Resolution Date: 03/20/2023 Goal Status: Unmet Unmet Reason: comorbidities Interventions: Assess patient/caregiver ability to obtain necessary supplies Assess patient/caregiver ability to perform ulcer/skin care regimen upon admission and as needed Assess ulceration(s) every visit Provide education on ulcer and skin care Treatment Activities: Skin care regimen initiated : 06/19/2022 Topical wound management initiated : 06/19/2022 Notes: Electronic Signature(s) Signed: 07/19/2023 3:42:39 PM By: [name] RN Signed: 07/20/2023 8:28:10 AM By: Betha Loa Entered By: Betha Loa on 07/19/2023 11:39:50 -------------------------------------------------------------------------------- Pain Assessment Details Patient Name: Date of Service: [name],  [name] [name] 07/19/2023 1:45 PM Medical Record Number: [id number] Patient Account Number: [id number] Date of Birth/Sex: Treating RN: [birth year] (77 y.o. [name] Primary Care Delaynie Stetzer: [name] Other Clinician: Betha Loa Referring Mireyah Chervenak: Treating Rebecca Cairns/Extender: [name] in Treatment: 56 Active Problems Location of Pain Severity and Description of Pain Patient Has Paino No Site Locations Pain Management and Medication Current Pain Management: Electronic Signature(s) Signed: 07/19/2023 3:42:39 PM By: [name] RN Signed: 07/20/2023 8:28:10 AM By: Lawana Pai ([id number]) ByDonella Stade.pdf Page 7 of 9 Signed: 07/20/2023 8:28:10 AM Entered By: Betha Loa on 07/19/2023 10:42:28 -------------------------------------------------------------------------------- Patient/Caregiver Education Details Patient Name: Date of Service: [name], [name] [facility] 9/30/2024andnbsp1:45 PM Medical Record Number: [id number] Patient Account Number: [id number] Date of Birth/Gender: Treating RN: 06-Sep-1946 (77 y.o. 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 N/A N/A Secondary Etiology: Cataracts, Arrhythmia, Coronary N/A N/A Comorbid History: Artery Disease, Hypertension, Type II Diabetes, Osteoarthritis, Neuropathy 06/05/2022 N/A N/A Date Acquired: 40 N/A N/A Weeks of Treatment: Open N/A N/A Wound Status: No N/A N/A Wound Recurrence: 0.1x0.1x0.1 N/A N/A Measurements L x W x D (cm) 0.008 N/A N/A A (cm) : rea 0.001 N/A N/A Volume (cm) : 99.40% N/A N/A % Reduction in A rea: 99.60% N/A N/A % Reduction in Volume: Grade 1 N/A N/A Classification: Medium N/A N/A Exudate A mount: Serosanguineous N/A N/A Exudate Type: red, brown N/A N/A Exudate Color: Flat and Intact N/A N/A Wound Margin: Small (1-33%) N/A N/A Granulation A mount: Pink N/A N/A Granulation Quality: Small (1-33%) N/A N/A Necrotic A mount: Fat Layer (Subcutaneous Tissue): Yes N/A N/A Exposed Structures: Fascia: No Tendon: No Muscle: No Joint: No Bone: No Medium (34-66%) N/A N/A Epithelialization: Treatment Notes Electronic Signature(s) Signed: 07/20/2023 8:28:10 AM By: Betha Loa Entered By: Betha Loa on 07/19/2023  10:56:20 -------------------------------------------------------------------------------- Multi-Disciplinary Care Plan Details Patient Name: Date of Service: [name], [name] [name] 07/19/2023 1:45 PM Medical Record Number: [id number] Patient Account Number: [id number] Date of Birth/Sex: Treating RN: [birth year] (77 y.o. [name]) [name] Primary Care Margie Brink: [name] Other Clinician: Betha Loa Referring Makenzey Nanni: Treating Clessie Karras/Extender: [name] in Treatment: 56 Active Inactive Wound/Skin Impairment Nursing Diagnoses: Impaired tissue integrity Goals: Patient/caregiver will verbalize understanding of skin care regimen Date Initiated: 06/19/2022 Target Resolution Date: 07/19/2022 Goal Status: Active Ulcer/skin breakdown will have a volume reduction of 30% by week 4 Date Initiated: 06/19/2022 Date Inactivated: 07/28/2022 Target Resolution Date: 07/17/2022 [name], [name] ([id number]) [phone].pdf Page 6 of 9 Goal Status: Unmet Unmet Reason: infection Ulcer/skin breakdown will have a volume reduction of 50% by week 8 Date Initiated: 07/28/2022 Date Inactivated: 05/18/2023 Target Resolution Date: 03/20/2023 Goal Status: Unmet Unmet Reason: comorbidities Interventions: Assess patient/caregiver ability to obtain necessary supplies Assess patient/caregiver ability to perform ulcer/skin care regimen upon admission and as needed Assess ulceration(s) every visit Provide education on ulcer and skin care Treatment Activities: Skin care regimen initiated : 06/19/2022 Topical wound management initiated : 06/19/2022 Notes: Electronic Signature(s) Signed: 07/19/2023 3:42:39 PM By: [name] RN Signed: 07/20/2023 8:28:10 AM By: Betha Loa Entered By: Betha Loa on 07/19/2023 11:39:50 -------------------------------------------------------------------------------- Pain Assessment Details Patient Name: Date of Service: [name],  [name] [name] 07/19/2023 1:45 PM Medical Record Number: [id number] Patient Account Number: [id number] Date of Birth/Sex: Treating RN: [birth year] (77 y.o. [name] Primary Care Delaynie Stetzer: [name] Other Clinician: Betha Loa Referring Mireyah Chervenak: Treating Rebecca Cairns/Extender: [name] in Treatment: 56 Active Problems Location of Pain Severity and Description of Pain Patient Has Paino No Site Locations Pain Management and Medication Current Pain Management: Electronic Signature(s) Signed: 07/19/2023 3:42:39 PM By: [name] RN Signed: 07/20/2023 8:28:10 AM By: Lawana Pai ([id number]) ByDonella Stade.pdf Page 7 of 9 Signed: 07/20/2023 8:28:10 AM Entered By: Betha Loa on 07/19/2023 10:42:28 -------------------------------------------------------------------------------- Patient/Caregiver Education Details Patient Name: Date of Service: [name], [name] [facility] 9/30/2024andnbsp1:45 PM Medical Record Number: [id number] Patient Account Number: [id number] Date of Birth/Gender: Treating RN: 06-Sep-1946 (77 y.o. [name] Primary Care Physician: [name] Other Clinician: Betha Loa Referring Physician: Treating Physician/Extender: [name] in Treatment: 61 Education Assessment Education Provided To: Patient Education Topics Provided Wound/Skin Impairment: Handouts:  Other: continue wound care as directed Methods: Explain/Verbal Responses: State content correctly Electronic Signature(s) Signed: 07/20/2023 8:28:10 AM By: Betha Loa Entered By: Betha Loa on 07/19/2023 11:40:09 -------------------------------------------------------------------------------- Wound Assessment Details Patient Name: Date of Service: [name], [name] [name] 07/19/2023 1:45 PM Medical Record Number: [id number] Patient Account Number: [id number] Date of Birth/Sex: Treating  RN: [birth year] (77 y.o. 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 Wounding Event: Gradually Appeared Etiology: Date Acquired: 06/05/2022 Wound Open Weeks Of Treatment: 56 Status: Clustered Wound: No Comorbid Cataracts, Arrhythmia, Coronary Artery Disease, Hypertension, History: Type II Diabetes, Osteoarthritis, Neuropathy BODIN, GORKA ([id number]) [phone].pdf Page 8 of 9 Photos Wound Measurements Length: (cm) 0.1 Width: (cm) 0.1 Depth: (cm) 0.1 Area: (cm) 0.008 Volume: (cm) 0.001 % Reduction in Area: 99.4% % Reduction in Volume: 99.6% Epithelialization: Medium (34-66%) Wound Description Classification: Grade 1 Wound Margin: Flat and Intact Exudate Amount: Medium Exudate Type: Serosanguineous Exudate Color: red, brown Foul Odor After Cleansing: No Slough/Fibrino Yes Wound Bed Granulation Amount: Small (1-33%) Exposed Structure Granulation Quality: Pink Fascia Exposed: No Necrotic Amount: Small (1-33%) Fat Layer (Subcutaneous Tissue) Exposed: Yes Necrotic Quality: Adherent Slough Tendon Exposed: No Muscle Exposed: No Joint Exposed: No Bone Exposed: No Treatment Notes Wound #10 (Calcaneus) Wound Laterality: Left Cleanser Peri-Wound Care AandD Ointment Discharge Instruction: Apply AandD Ointment to heels and legs in the mornings Topical Primary Dressing Secondary Dressing ABD Pad 5x9 (in/in) Discharge Instruction: Cover heal with ABD pad for protection Secured With Medipore T - 80M Medipore H Soft Cloth Surgical T ape ape, 2x2 (in/yd) Conform 4'' - Conforming Stretch Gauze Bandage 4x75 (in/in) Discharge Instruction: Apply as  directed Tubigrip Size D, 3x10 (in/yd) Discharge Instruction: double D Compression Wrap Compression Stockings Add-Ons Electronic Signature(s) Signed: 07/19/2023 3:42:39 PM By: [name] RN Signed: 07/20/2023 8:28:10 AM By: Betha Loa Entered By: Betha Loa on 07/19/2023 10:54:46 Barrie Folk ([id number]) [id number].pdf Page 9 of 9 -------------------------------------------------------------------------------- Vitals Details Patient Name: Date of Service: [name] 07/19/2023 1:45 PM Medical Record Number: [id number] Patient Account Number: [id number] Date of Birth/Sex: Treating RN: 1946-07-25 (77 y.o. 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 N/A N/A Secondary Etiology: Cataracts, Arrhythmia, Coronary N/A N/A Comorbid History: Artery Disease, Hypertension, Type II Diabetes, Osteoarthritis, Neuropathy 06/05/2022 N/A N/A Date Acquired: 40 N/A N/A Weeks of Treatment: Open N/A N/A Wound Status: No N/A N/A Wound Recurrence: 0.1x0.1x0.1 N/A N/A Measurements L x W x D (cm) 0.008 N/A N/A A (cm) : rea 0.001 N/A N/A Volume (cm) : 99.40% N/A N/A % Reduction in A rea: 99.60% N/A N/A % Reduction in Volume: Grade 1 N/A N/A Classification: Medium N/A N/A Exudate A mount: Serosanguineous N/A N/A Exudate Type: red, brown N/A N/A Exudate Color: Flat and Intact N/A N/A Wound Margin: Small (1-33%) N/A N/A Granulation A mount: Pink N/A N/A Granulation Quality: Small (1-33%) N/A N/A Necrotic A mount: Fat Layer (Subcutaneous Tissue): Yes N/A N/A Exposed Structures: Fascia: No Tendon: No Muscle: No Joint: No Bone: No Medium (34-66%) N/A N/A Epithelialization: Treatment Notes Electronic Signature(s) Signed: 07/20/2023 8:28:10 AM By: Betha Loa Entered By: Betha Loa on 07/19/2023  10:56:20 -------------------------------------------------------------------------------- Multi-Disciplinary Care Plan Details Patient Name: Date of Service: [name], [name] [name] 07/19/2023 1:45 PM Medical Record Number: [id number] Patient Account Number: [id number] Date of Birth/Sex: Treating RN: [birth year] (77 y.o. [name]) [name] Primary Care Margie Brink: [name] Other Clinician: Betha Loa Referring Makenzey Nanni: Treating Clessie Karras/Extender: [name] in Treatment: 56 Active Inactive Wound/Skin Impairment Nursing Diagnoses: Impaired tissue integrity Goals: Patient/caregiver will verbalize understanding of skin care regimen Date Initiated: 06/19/2022 Target Resolution Date: 07/19/2022 Goal Status: Active Ulcer/skin breakdown will have a volume reduction of 30% by week 4 Date Initiated: 06/19/2022 Date Inactivated: 07/28/2022 Target Resolution Date: 07/17/2022 [name], [name] ([id number]) [phone].pdf Page 6 of 9 Goal Status: Unmet Unmet Reason: infection Ulcer/skin breakdown will have a volume reduction of 50% by week 8 Date Initiated: 07/28/2022 Date Inactivated: 05/18/2023 Target Resolution Date: 03/20/2023 Goal Status: Unmet Unmet Reason: comorbidities Interventions: Assess patient/caregiver ability to obtain necessary supplies Assess patient/caregiver ability to perform ulcer/skin care regimen upon admission and as needed Assess ulceration(s) every visit Provide education on ulcer and skin care Treatment Activities: Skin care regimen initiated : 06/19/2022 Topical wound management initiated : 06/19/2022 Notes: Electronic Signature(s) Signed: 07/19/2023 3:42:39 PM By: [name] RN Signed: 07/20/2023 8:28:10 AM By: Betha Loa Entered By: Betha Loa on 07/19/2023 11:39:50 -------------------------------------------------------------------------------- Pain Assessment Details Patient Name: Date of Service: [name],  [name] [name] 07/19/2023 1:45 PM Medical Record Number: [id number] Patient Account Number: [id number] Date of Birth/Sex: Treating RN: [birth year] (77 y.o. [name] Primary Care Delaynie Stetzer: [name] Other Clinician: Betha Loa Referring Mireyah Chervenak: Treating Rebecca Cairns/Extender: [name] in Treatment: 56 Active Problems Location of Pain Severity and Description of Pain Patient Has Paino No Site Locations Pain Management and Medication Current Pain Management: Electronic Signature(s) Signed: 07/19/2023 3:42:39 PM By: [name] RN Signed: 07/20/2023 8:28:10 AM By: Lawana Pai ([id number]) ByDonella Stade.pdf Page 7 of 9 Signed: 07/20/2023 8:28:10 AM Entered By: Betha Loa on 07/19/2023 10:42:28 -------------------------------------------------------------------------------- Patient/Caregiver Education Details Patient Name: Date of Service: [name], [name] [facility] 9/30/2024andnbsp1:45 PM Medical Record Number: [id number] Patient Account Number: [id number] Date of Birth/Gender: Treating RN: 06-Sep-1946 (77 y.o. [name] Primary Care Physician: [name] Other Clinician: Betha Loa Referring Physician: Treating Physician/Extender: [name] in Treatment: 61 Education Assessment Education Provided To: Patient Education Topics Provided Wound/Skin Impairment: Handouts:

## 2023-07-21 ENCOUNTER — Other Ambulatory Visit (HOSPITAL_COMMUNITY): Payer: Self-pay

## 2023-07-21 NOTE — Telephone Encounter (Signed)
Pharmacy Patient Advocate Encounter  Received notification from [facility] that Prior Authorization for Pregabalin 150MG  Capsule has been APPROVED from 07/20/2023 to 10/19/2023. Ran test claim, Copay is $6.19 per 30DS. This test claim was processed through [facility]- copay amounts may vary at other pharmacies due to pharmacy/plan contracts, or as the patient moves through the different stages of their insurance plan.   PA #/Case ID/Reference #: N/A

## 2023-07-22 DIAGNOSIS — R791 Abnormal coagulation profile: Secondary | ICD-10-CM | POA: Diagnosis not present

## 2023-07-26 ENCOUNTER — Encounter: Payer: Medicare HMO | Attending: Physician Assistant | Admitting: Physician Assistant

## 2023-07-26 DIAGNOSIS — I480 Paroxysmal atrial fibrillation: Secondary | ICD-10-CM | POA: Insufficient documentation

## 2023-07-26 DIAGNOSIS — M1990 Unspecified osteoarthritis, unspecified site: Secondary | ICD-10-CM | POA: Diagnosis not present

## 2023-07-26 DIAGNOSIS — E11621 Type 2 diabetes mellitus with foot ulcer: Secondary | ICD-10-CM | POA: Insufficient documentation

## 2023-07-26 DIAGNOSIS — Z952 Presence of prosthetic heart valve: Secondary | ICD-10-CM | POA: Diagnosis not present

## 2023-07-26 DIAGNOSIS — Z7901 Long term (current) use of anticoagulants: Secondary | ICD-10-CM | POA: Insufficient documentation

## 2023-07-26 DIAGNOSIS — E1142 Type 2 diabetes mellitus with diabetic polyneuropathy: Secondary | ICD-10-CM | POA: Insufficient documentation

## 2023-07-26 DIAGNOSIS — G8929 Other chronic pain: Secondary | ICD-10-CM | POA: Diagnosis not present

## 2023-07-26 DIAGNOSIS — L97422 Non-pressure chronic ulcer of left heel and midfoot with fat layer exposed: Secondary | ICD-10-CM | POA: Diagnosis not present

## 2023-07-26 DIAGNOSIS — I10 Essential (primary) hypertension: Secondary | ICD-10-CM | POA: Insufficient documentation

## 2023-07-26 DIAGNOSIS — L97522 Non-pressure chronic ulcer of other part of left foot with fat layer exposed: Secondary | ICD-10-CM | POA: Insufficient documentation

## 2023-07-26 NOTE — Progress Notes (Addendum)
MOXON, MESSLER ([id number]) [id number] Page 1 of 13 Visit Report for 07/26/2023 Chief Complaint Document Details Patient Name: Date of Service: [name] 07/26/2023 12:45 PM Medical Record Number: [id number] Patient Account Number: [id number] Date of Birth/Sex: Treating RN: 12/29/1945 (77 y.o. [name]) [name] Primary Care Provider: [name] Other Clinician: Betha Loa Referring Provider: Treating Provider/Extender: [name] in Treatment: 8 Information Obtained from: Patient Chief Complaint Left heel ulcer Electronic Signature(s) Signed: 07/26/2023 12:49:33 PM By: [name] Derry PA-C Entered By: [name] Derry on 07/26/2023 09:49:33 -------------------------------------------------------------------------------- HPI Details Patient Name: Date of Service: [name], [name] 07/26/2023 12:45 PM Medical Record Number: [id number] Patient Account Number: [id number] Date of Birth/Sex: Treating RN: 1946/10/12 (77 y.o. [name] Primary Care Provider: [name] Other Clinician: Betha Loa Referring Provider: Treating Provider/Extender: [name] in Treatment: 17 History of Present Illness HPI Description: 09/24/2020 on evaluation today patient presents today for a heel ulcer that he tells me has been present for about 2 years. He has been seeing podiatry and they have been attempting to manage this including what sounds to be a total contact cast, Unna boot, and just standard dressings otherwise as well. Most recently has been using triple antibiotic ointment. With that being said unfortunately despite everything he really has not had any significant improvement. He tells me that he cannot even really remember exactly how this began but he presumed it may have rubbed on his shoes or something of that nature. 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 He tells me that this does seem to help. Fortunately there is no signs of active infection at this time. His most recent hemoglobin A1c was 8.1 though he knows this was this year he cannot tell me the exact time. His fluid pills currently to help with some of the lower extremity edema although he does obviously have signs of venous stasis/lymphedema. Currently there is no evidence of active infection. No fevers, chills, nausea, vomiting, or diarrhea. Patient has had fairly recent ABIs which were performed on 07/19/2020 and revealed that he has normal findings in both the ankle and toe locations bilaterally. His ABI on the right was 1.09 on the left was 1.08 with a TBI on the right of 0.88 and on the left of 0.94. Triphasic flow was noted throughout. 10/08/2020 on evaluation today patient appears to be doing pretty well in regard to his left heel currently in fact this is doing a great job and seems to be healing quite nicely. Unfortunately on his right leg he had a pile of wood that actually fell on him injuring his right leg this is somewhat erythematous has me concerned little bit about cellulitis though there is not really a good area to culture at this point. AVIS, MCMAHILL ([id number]) [id number] Page 2 of 13 10/24/2020 upon evaluation today patient appears to be doing well with regard to his heel ulcer. He is showing signs of improvement which is great news. His right leg is completely healed. Overall I feel like he is doing excellent and there is no signs of infection. 11/07/2020 upon evaluation today patient appears to be doing well with regard to  this may be secondary to the Tubigrip more than anything to be honest. Fortunately I do not see any signs of active infection at this time which is great news. No fevers, chills, nausea, vomiting, or diarrhea. Objective Constitutional Well-nourished and well-hydrated in no acute distress. Vitals Time Taken: 1:02 PM, Height: 70 in, Weight: 265 lbs, BMI: 38, Temperature: 98.3 F, Pulse: 71 bpm, Respiratory Rate: 18 breaths/min, Blood Pressure: 153/57 mmHg. Respiratory normal breathing without difficulty. Psychiatric this patient is able to make decisions and demonstrates good insight into disease process. Alert and Oriented x 3. pleasant and cooperative. General Notes: Upon inspection patient's wound bed actually showed signs of good granulation epithelization at this point. Fortunately I do not see any signs of active infection systemically and I think that in general he is really doing quite well. In fact the end of his foot I think may just be due to the Tubigrip squeezing right at the end of his foot causing this to swell more so. Working to see how this does over the next week with slight change. nor locally Integumentary (Hair, Skin) Wound #10 status is Healed - Epithelialized. Original cause of wound was Gradually Appeared. The date acquired was: 06/05/2022. The wound has been in treatment 57 weeks. The wound is located on the Left Calcaneus. The wound measures 0cm length x 0cm width x 0cm depth; 0cm^2 area and 0cm^3 volume. There is Fat Layer (Subcutaneous  Tissue) exposed. There is a none present amount of drainage noted. The wound margin is flat and intact. There is no granulation within the wound bed. There is no necrotic tissue within the wound bed. 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 Bathing/ Shower/ Hygiene: May shower; gently cleanse wound with antibacterial soap, rinse and pat dry prior to dressing wounds Additional Orders / Instructions: Other: - apply Urea cream to heels at night, use heel cup for protection Medications-Please add to medication list.: P.O. Antibiotics - finish Bactrim DS 1. I would recommend that he continue with using the urea cream to the heel which I think is softening things up the heel looks much better. 2. I am going to recommend as well that he should continue with the heel cup for protection. Were 3 and also can recommend that he finish off his antibiotics. 4. We will use the Tubigrip just a single-layer size C and on the end of his foot he will double that back in order to keep the end of the foot from swelling and we will see how this looks come next week. We will see patient back for reevaluation in 1 week here in the clinic. If anything worsens or changes patient will contact our office for additional recommendations. Electronic Signature(s) Signed: 07/26/2023 1:47:19 PM By: [name] Derry PA-C Entered By: [name] Derry on 07/26/2023 10:47:19 -------------------------------------------------------------------------------- SuperBill Details Patient Name: Date of Service: [name], [name] 07/26/2023 Medical Record Number: [id number] Patient Account Number: [id number] Date of Birth/Sex: Treating  RN: September 12, 1946 (77 y.o. [name]) [name] Primary Care Provider: [name] Other Clinician: Betha Loa Referring Provider: Treating Provider/Extender: [name] in Treatment: 57 Diagnosis Coding ICD-10 Codes Code Description E11.621 Type 2 diabetes mellitus with foot ulcer L97.522 Non-pressure chronic ulcer of other part of left  MOXON, MESSLER ([id number]) [id number] Page 1 of 13 Visit Report for 07/26/2023 Chief Complaint Document Details Patient Name: Date of Service: [name] 07/26/2023 12:45 PM Medical Record Number: [id number] Patient Account Number: [id number] Date of Birth/Sex: Treating RN: 12/29/1945 (77 y.o. [name]) [name] Primary Care Provider: [name] Other Clinician: Betha Loa Referring Provider: Treating Provider/Extender: [name] in Treatment: 8 Information Obtained from: Patient Chief Complaint Left heel ulcer Electronic Signature(s) Signed: 07/26/2023 12:49:33 PM By: [name] Derry PA-C Entered By: [name] Derry on 07/26/2023 09:49:33 -------------------------------------------------------------------------------- HPI Details Patient Name: Date of Service: [name], [name] 07/26/2023 12:45 PM Medical Record Number: [id number] Patient Account Number: [id number] Date of Birth/Sex: Treating RN: 1946/10/12 (77 y.o. [name] Primary Care Provider: [name] Other Clinician: Betha Loa Referring Provider: Treating Provider/Extender: [name] in Treatment: 17 History of Present Illness HPI Description: 09/24/2020 on evaluation today patient presents today for a heel ulcer that he tells me has been present for about 2 years. He has been seeing podiatry and they have been attempting to manage this including what sounds to be a total contact cast, Unna boot, and just standard dressings otherwise as well. Most recently has been using triple antibiotic ointment. With that being said unfortunately despite everything he really has not had any significant improvement. He tells me that he cannot even really remember exactly how this began but he presumed it may have rubbed on his shoes or something of that nature. 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 He tells me that this does seem to help. Fortunately there is no signs of active infection at this time. His most recent hemoglobin A1c was 8.1 though he knows this was this year he cannot tell me the exact time. His fluid pills currently to help with some of the lower extremity edema although he does obviously have signs of venous stasis/lymphedema. Currently there is no evidence of active infection. No fevers, chills, nausea, vomiting, or diarrhea. Patient has had fairly recent ABIs which were performed on 07/19/2020 and revealed that he has normal findings in both the ankle and toe locations bilaterally. His ABI on the right was 1.09 on the left was 1.08 with a TBI on the right of 0.88 and on the left of 0.94. Triphasic flow was noted throughout. 10/08/2020 on evaluation today patient appears to be doing pretty well in regard to his left heel currently in fact this is doing a great job and seems to be healing quite nicely. Unfortunately on his right leg he had a pile of wood that actually fell on him injuring his right leg this is somewhat erythematous has me concerned little bit about cellulitis though there is not really a good area to culture at this point. AVIS, MCMAHILL ([id number]) [id number] Page 2 of 13 10/24/2020 upon evaluation today patient appears to be doing well with regard to his heel ulcer. He is showing signs of improvement which is great news. His right leg is completely healed. Overall I feel like he is doing excellent and there is no signs of infection. 11/07/2020 upon evaluation today patient appears to be doing well with regard to  diabetic. We have been using Hydrofera Blue under Urgo K2 lite wrap he has been in his surgical shoe although he tells me it disintegrated and he came in on a regular running shoe today. 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 He is not wearing compression stocking 06-22-2023 upon evaluation today patient appears to be doing excellent in regard to his wound on the heel which is actually significantly smaller compared to last week's evaluation. Fortunately I do not see any signs of active infection locally or systemically at this time. 06-28-2023 upon evaluation today patient appears to be doing well currently in regard to his heel which is actually showing signs of improvement. Fortunately I do not see any evidence of active infection at this time which is great news and in general I do believe that we are making headway towards complete closure which is good news. No fevers, chills, nausea, vomiting, or diarrhea. 07-05-2023 upon evaluation today patient appears to be doing well currently in regard to his heel ulcer. This is actually very close I think that being completely healed. This is great news and overall very pleased with where we stand today. 07-12-2023 upon evaluation today patient appears to be doing well currently in regard to his wound. He in fact appears to be very close to complete resolution I am very pleased that regard. However on the tip of his foot there appears to be some issue here with inflammation secondary to infection. I am very concerned about the infection at the site. 07-19-2023 upon evaluation today patient appears to actually be doing well in  regard to his heel. Fortunately I do not see any signs of worsening overall. In general I think you are making headway towards closure. 07-26-2023 upon evaluation today patient appears to be doing well currently in regard to his wound. He has in fact completely healed and looks to be doing great. I still want to keep an eye on things due to the end of his foot being red although I think this may be secondary to the Tubigrip more than anything to be honest. Fortunately I do not see any signs of active infection at this time which is great news. No fevers, chills, nausea, vomiting, or diarrhea. Electronic Signature(s) Signed: 07/26/2023 1:46:25 PM By: [name] Derry PA-C Entered By: [name] Derry on 07/26/2023 10:46:25 -------------------------------------------------------------------------------- Physical Exam Details Patient Name: Date of Service: [name]. 07/26/2023 12:45 PM Medical Record Number: [id number] Patient Account Number: [id number] Date of Birth/Sex: Treating RN: [birth year] (77 y.o. [name] Primary Care Provider: [name] Other Clinician: Betha Loa Referring Provider: Treating Provider/Extender: Hermine Messick Weeks in Treatment: 60 Constitutional Well-nourished and well-hydrated in no acute distress. Respiratory normal breathing without difficulty. Psychiatric this patient is able to make decisions and demonstrates good insight into disease process. Alert and Oriented x 3. pleasant and cooperative. Notes Upon inspection patient's wound bed actually showed signs of good granulation epithelization at this point. Fortunately I do not see any signs of active infection systemically and I think that in general he is really doing quite well. In fact the end of his foot I think may just be due to the Tubigrip squeezing right at the end of his foot causing this to swell more so. Working to see how this does over the next week with slight change.  nor locally HAWKINS, SEAMAN ([id number]) [id number] Page 6 of 13 Electronic Signature(s) Signed: 07/26/2023 1:46:49 PM  MOXON, MESSLER ([id number]) [id number] Page 1 of 13 Visit Report for 07/26/2023 Chief Complaint Document Details Patient Name: Date of Service: [name] 07/26/2023 12:45 PM Medical Record Number: [id number] Patient Account Number: [id number] Date of Birth/Sex: Treating RN: 12/29/1945 (77 y.o. [name]) [name] Primary Care Provider: [name] Other Clinician: Betha Loa Referring Provider: Treating Provider/Extender: [name] in Treatment: 8 Information Obtained from: Patient Chief Complaint Left heel ulcer Electronic Signature(s) Signed: 07/26/2023 12:49:33 PM By: [name] Derry PA-C Entered By: [name] Derry on 07/26/2023 09:49:33 -------------------------------------------------------------------------------- HPI Details Patient Name: Date of Service: [name], [name] 07/26/2023 12:45 PM Medical Record Number: [id number] Patient Account Number: [id number] Date of Birth/Sex: Treating RN: 1946/10/12 (77 y.o. [name] Primary Care Provider: [name] Other Clinician: Betha Loa Referring Provider: Treating Provider/Extender: [name] in Treatment: 17 History of Present Illness HPI Description: 09/24/2020 on evaluation today patient presents today for a heel ulcer that he tells me has been present for about 2 years. He has been seeing podiatry and they have been attempting to manage this including what sounds to be a total contact cast, Unna boot, and just standard dressings otherwise as well. Most recently has been using triple antibiotic ointment. With that being said unfortunately despite everything he really has not had any significant improvement. He tells me that he cannot even really remember exactly how this began but he presumed it may have rubbed on his shoes or something of that nature. 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 He tells me that this does seem to help. Fortunately there is no signs of active infection at this time. His most recent hemoglobin A1c was 8.1 though he knows this was this year he cannot tell me the exact time. His fluid pills currently to help with some of the lower extremity edema although he does obviously have signs of venous stasis/lymphedema. Currently there is no evidence of active infection. No fevers, chills, nausea, vomiting, or diarrhea. Patient has had fairly recent ABIs which were performed on 07/19/2020 and revealed that he has normal findings in both the ankle and toe locations bilaterally. His ABI on the right was 1.09 on the left was 1.08 with a TBI on the right of 0.88 and on the left of 0.94. Triphasic flow was noted throughout. 10/08/2020 on evaluation today patient appears to be doing pretty well in regard to his left heel currently in fact this is doing a great job and seems to be healing quite nicely. Unfortunately on his right leg he had a pile of wood that actually fell on him injuring his right leg this is somewhat erythematous has me concerned little bit about cellulitis though there is not really a good area to culture at this point. AVIS, MCMAHILL ([id number]) [id number] Page 2 of 13 10/24/2020 upon evaluation today patient appears to be doing well with regard to his heel ulcer. He is showing signs of improvement which is great news. His right leg is completely healed. Overall I feel like he is doing excellent and there is no signs of infection. 11/07/2020 upon evaluation today patient appears to be doing well with regard to  this may be secondary to the Tubigrip more than anything to be honest. Fortunately I do not see any signs of active infection at this time which is great news. No fevers, chills, nausea, vomiting, or diarrhea. Objective Constitutional Well-nourished and well-hydrated in no acute distress. Vitals Time Taken: 1:02 PM, Height: 70 in, Weight: 265 lbs, BMI: 38, Temperature: 98.3 F, Pulse: 71 bpm, Respiratory Rate: 18 breaths/min, Blood Pressure: 153/57 mmHg. Respiratory normal breathing without difficulty. Psychiatric this patient is able to make decisions and demonstrates good insight into disease process. Alert and Oriented x 3. pleasant and cooperative. General Notes: Upon inspection patient's wound bed actually showed signs of good granulation epithelization at this point. Fortunately I do not see any signs of active infection systemically and I think that in general he is really doing quite well. In fact the end of his foot I think may just be due to the Tubigrip squeezing right at the end of his foot causing this to swell more so. Working to see how this does over the next week with slight change. nor locally Integumentary (Hair, Skin) Wound #10 status is Healed - Epithelialized. Original cause of wound was Gradually Appeared. The date acquired was: 06/05/2022. The wound has been in treatment 57 weeks. The wound is located on the Left Calcaneus. The wound measures 0cm length x 0cm width x 0cm depth; 0cm^2 area and 0cm^3 volume. There is Fat Layer (Subcutaneous  Tissue) exposed. There is a none present amount of drainage noted. The wound margin is flat and intact. There is no granulation within the wound bed. There is no necrotic tissue within the wound bed. 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 Bathing/ Shower/ Hygiene: May shower; gently cleanse wound with antibacterial soap, rinse and pat dry prior to dressing wounds Additional Orders / Instructions: Other: - apply Urea cream to heels at night, use heel cup for protection Medications-Please add to medication list.: P.O. Antibiotics - finish Bactrim DS 1. I would recommend that he continue with using the urea cream to the heel which I think is softening things up the heel looks much better. 2. I am going to recommend as well that he should continue with the heel cup for protection. Were 3 and also can recommend that he finish off his antibiotics. 4. We will use the Tubigrip just a single-layer size C and on the end of his foot he will double that back in order to keep the end of the foot from swelling and we will see how this looks come next week. We will see patient back for reevaluation in 1 week here in the clinic. If anything worsens or changes patient will contact our office for additional recommendations. Electronic Signature(s) Signed: 07/26/2023 1:47:19 PM By: [name] Derry PA-C Entered By: [name] Derry on 07/26/2023 10:47:19 -------------------------------------------------------------------------------- SuperBill Details Patient Name: Date of Service: [name], [name] 07/26/2023 Medical Record Number: [id number] Patient Account Number: [id number] Date of Birth/Sex: Treating  RN: September 12, 1946 (77 y.o. [name]) [name] Primary Care Provider: [name] Other Clinician: Betha Loa Referring Provider: Treating Provider/Extender: [name] in Treatment: 57 Diagnosis Coding ICD-10 Codes Code Description E11.621 Type 2 diabetes mellitus with foot ulcer L97.522 Non-pressure chronic ulcer of other part of left  By: [name] Derry PA-C Entered By: [name] Derry on 07/26/2023 10:46:48 -------------------------------------------------------------------------------- Physician Orders Details Patient Name: Date of Service: [name], [name] 07/26/2023 12:45 PM Medical Record Number: [id number] Patient Account Number: [id number] Date of Birth/Sex: Treating RN: 08-18-1946 (77 y.o. [name]) [name] Primary Care Provider: [name] Other Clinician: Betha Loa Referring Provider: Treating Provider/Extender: [name] in Treatment: 66 Verbal / Phone Orders: Yes Clinician: [name] Read Back and Verified: Yes Diagnosis Coding ICD-10 Coding Code Description E11.621 Type 2 diabetes mellitus with foot ulcer L97.522 Non-pressure chronic ulcer of other part of left foot with fat layer exposed E11.42 Type 2 diabetes mellitus with diabetic polyneuropathy Follow-up Appointments Return Appointment in 1 week. Bathing/ Shower/ Hygiene May shower; gently cleanse wound with antibacterial soap, rinse and pat dry prior to dressing wounds Additional Orders / Instructions Other: - apply Urea cream to heels at night, use heel cup for protection Medications-Please add to medication list. ntibiotics - finish Bactrim DS P.O. A Electronic Signature(s) Signed: 07/26/2023 3:41:41 PM By: [name] Derry PA-C Signed: 07/26/2023 4:27:08 PM By: Betha Loa Entered By: Betha Loa on 07/26/2023 10:35:44 -------------------------------------------------------------------------------- Problem List Details Patient Name: Date of Service: [name], [name] 07/26/2023 12:45 PM Medical Record Number: [id number] Patient Account Number: [id number] Date of Birth/Sex: Treating RN: 09-04-1946 (77 y.o. [name] Primary Care Provider: [name] Other Clinician: Betha Loa Referring Provider: Treating Provider/Extender: [name] in Treatment: [address] [place] ([id number]) [id number] Page 7 of 13 Active Problems ICD-10 Encounter Code Description Active Date MDM Diagnosis E11.621 Type 2 diabetes mellitus with foot ulcer 06/19/2022 No Yes L97.522 Non-pressure chronic ulcer of other part of left foot with fat layer exposed 06/19/2022 No Yes E11.42 Type 2 diabetes mellitus with diabetic polyneuropathy 06/19/2022 No Yes Inactive Problems Resolved Problems Electronic Signature(s) Signed: 07/26/2023 12:49:30 PM By: [name] Derry PA-C Entered By: [name] Derry on 07/26/2023 09:49:30 -------------------------------------------------------------------------------- Progress Note Details Patient Name: Date of Service: [name], [name] 07/26/2023 12:45 PM Medical Record Number: [id number] Patient Account Number: [id number] Date of Birth/Sex: Treating RN: 07/23/1946 (77 y.o. [name] Primary Care Provider: [name] Other Clinician: Betha Loa Referring Provider: Treating Provider/Extender: [name] in Treatment: 57 Subjective Chief Complaint Information obtained from Patient Left heel ulcer History of Present Illness (HPI) 09/24/2020 on evaluation today patient presents today for a heel ulcer that he tells me has been present for about 2 years. He has been seeing podiatry and they have been attempting to manage this including what sounds to be a total contact cast, Unna boot, and just standard dressings otherwise as well. Most recently has been using triple antibiotic ointment. With that being said unfortunately despite everything he really has not had any significant improvement. He tells me that he cannot even really remember exactly how this began but he presumed it may have rubbed on his shoes or something of that nature. 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 He tells me that this does seem to help. Fortunately there is no signs of active infection at this time. His most recent hemoglobin A1c was 8.1 though he knows this was this  this may be secondary to the Tubigrip more than anything to be honest. Fortunately I do not see any signs of active infection at this time which is great news. No fevers, chills, nausea, vomiting, or diarrhea. Objective Constitutional Well-nourished and well-hydrated in no acute distress. Vitals Time Taken: 1:02 PM, Height: 70 in, Weight: 265 lbs, BMI: 38, Temperature: 98.3 F, Pulse: 71 bpm, Respiratory Rate: 18 breaths/min, Blood Pressure: 153/57 mmHg. Respiratory normal breathing without difficulty. Psychiatric this patient is able to make decisions and demonstrates good insight into disease process. Alert and Oriented x 3. pleasant and cooperative. General Notes: Upon inspection patient's wound bed actually showed signs of good granulation epithelization at this point. Fortunately I do not see any signs of active infection systemically and I think that in general he is really doing quite well. In fact the end of his foot I think may just be due to the Tubigrip squeezing right at the end of his foot causing this to swell more so. Working to see how this does over the next week with slight change. nor locally Integumentary (Hair, Skin) Wound #10 status is Healed - Epithelialized. Original cause of wound was Gradually Appeared. The date acquired was: 06/05/2022. The wound has been in treatment 57 weeks. The wound is located on the Left Calcaneus. The wound measures 0cm length x 0cm width x 0cm depth; 0cm^2 area and 0cm^3 volume. There is Fat Layer (Subcutaneous  Tissue) exposed. There is a none present amount of drainage noted. The wound margin is flat and intact. There is no granulation within the wound bed. There is no necrotic tissue within the wound bed. 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 Bathing/ Shower/ Hygiene: May shower; gently cleanse wound with antibacterial soap, rinse and pat dry prior to dressing wounds Additional Orders / Instructions: Other: - apply Urea cream to heels at night, use heel cup for protection Medications-Please add to medication list.: P.O. Antibiotics - finish Bactrim DS 1. I would recommend that he continue with using the urea cream to the heel which I think is softening things up the heel looks much better. 2. I am going to recommend as well that he should continue with the heel cup for protection. Were 3 and also can recommend that he finish off his antibiotics. 4. We will use the Tubigrip just a single-layer size C and on the end of his foot he will double that back in order to keep the end of the foot from swelling and we will see how this looks come next week. We will see patient back for reevaluation in 1 week here in the clinic. If anything worsens or changes patient will contact our office for additional recommendations. Electronic Signature(s) Signed: 07/26/2023 1:47:19 PM By: [name] Derry PA-C Entered By: [name] Derry on 07/26/2023 10:47:19 -------------------------------------------------------------------------------- SuperBill Details Patient Name: Date of Service: [name], [name] 07/26/2023 Medical Record Number: [id number] Patient Account Number: [id number] Date of Birth/Sex: Treating  RN: September 12, 1946 (77 y.o. [name]) [name] Primary Care Provider: [name] Other Clinician: Betha Loa Referring Provider: Treating Provider/Extender: [name] in Treatment: 57 Diagnosis Coding ICD-10 Codes Code Description E11.621 Type 2 diabetes mellitus with foot ulcer L97.522 Non-pressure chronic ulcer of other part of left  MOXON, MESSLER ([id number]) [id number] Page 1 of 13 Visit Report for 07/26/2023 Chief Complaint Document Details Patient Name: Date of Service: [name]. 07/26/2023 12:45 PM Medical Record Number: [id number] Patient Account Number: [id number] Date of Birth/Sex: Treating RN: 12/29/1945 (77 y.o. [name]) [name] Primary Care Provider: [name] Other Clinician: Betha Loa Referring Provider: Treating Provider/Extender: [name] in Treatment: 8 Information Obtained from: Patient Chief Complaint Left heel ulcer Electronic Signature(s) Signed: 07/26/2023 12:49:33 PM By: [name] Derry PA-C Entered By: [name] Derry on 07/26/2023 09:49:33 -------------------------------------------------------------------------------- HPI Details Patient Name: Date of Service: [name], [name] 07/26/2023 12:45 PM Medical Record Number: [id number] Patient Account Number: [id number] Date of Birth/Sex: Treating RN: 1946/10/12 (77 y.o. [name] Primary Care Provider: [name] Other Clinician: Betha Loa Referring Provider: Treating Provider/Extender: [name] in Treatment: 17 History of Present Illness HPI Description: 09/24/2020 on evaluation today patient presents today for a heel ulcer that he tells me has been present for about 2 years. He has been seeing podiatry and they have been attempting to manage this including what sounds to be a total contact cast, Unna boot, and just standard dressings otherwise as well. Most recently has been using triple antibiotic ointment. With that being said unfortunately despite everything he really has not had any significant improvement. He tells me that he cannot even really remember exactly how this began but he presumed it may have rubbed on his shoes or something of that nature. 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 He tells me that this does seem to help. Fortunately there is no signs of active infection at this time. His most recent hemoglobin A1c was 8.1 though he knows this was this year he cannot tell me the exact time. His fluid pills currently to help with some of the lower extremity edema although he does obviously have signs of venous stasis/lymphedema. Currently there is no evidence of active infection. No fevers, chills, nausea, vomiting, or diarrhea. Patient has had fairly recent ABIs which were performed on 07/19/2020 and revealed that he has normal findings in both the ankle and toe locations bilaterally. His ABI on the right was 1.09 on the left was 1.08 with a TBI on the right of 0.88 and on the left of 0.94. Triphasic flow was noted throughout. 10/08/2020 on evaluation today patient appears to be doing pretty well in regard to his left heel currently in fact this is doing a great job and seems to be healing quite nicely. Unfortunately on his right leg he had a pile of wood that actually fell on him injuring his right leg this is somewhat erythematous has me concerned little bit about cellulitis though there is not really a good area to culture at this point. AVIS, MCMAHILL ([id number]) [id number] Page 2 of 13 10/24/2020 upon evaluation today patient appears to be doing well with regard to his heel ulcer. He is showing signs of improvement which is great news. His right leg is completely healed. Overall I feel like he is doing excellent and there is no signs of infection. 11/07/2020 upon evaluation today patient appears to be doing well with regard to  diabetic. We have been using Hydrofera Blue under Urgo K2 lite wrap he has been in his surgical shoe although he tells me it disintegrated and he came in on a regular running shoe today. 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 He is not wearing compression stocking 06-22-2023 upon evaluation today patient appears to be doing excellent in regard to his wound on the heel which is actually significantly smaller compared to last week's evaluation. Fortunately I do not see any signs of active infection locally or systemically at this time. 06-28-2023 upon evaluation today patient appears to be doing well currently in regard to his heel which is actually showing signs of improvement. Fortunately I do not see any evidence of active infection at this time which is great news and in general I do believe that we are making headway towards complete closure which is good news. No fevers, chills, nausea, vomiting, or diarrhea. 07-05-2023 upon evaluation today patient appears to be doing well currently in regard to his heel ulcer. This is actually very close I think that being completely healed. This is great news and overall very pleased with where we stand today. 07-12-2023 upon evaluation today patient appears to be doing well currently in regard to his wound. He in fact appears to be very close to complete resolution I am very pleased that regard. However on the tip of his foot there appears to be some issue here with inflammation secondary to infection. I am very concerned about the infection at the site. 07-19-2023 upon evaluation today patient appears to actually be doing well in  regard to his heel. Fortunately I do not see any signs of worsening overall. In general I think you are making headway towards closure. 07-26-2023 upon evaluation today patient appears to be doing well currently in regard to his wound. He has in fact completely healed and looks to be doing great. I still want to keep an eye on things due to the end of his foot being red although I think this may be secondary to the Tubigrip more than anything to be honest. Fortunately I do not see any signs of active infection at this time which is great news. No fevers, chills, nausea, vomiting, or diarrhea. Electronic Signature(s) Signed: 07/26/2023 1:46:25 PM By: [name] Derry PA-C Entered By: [name] Derry on 07/26/2023 10:46:25 -------------------------------------------------------------------------------- Physical Exam Details Patient Name: Date of Service: [name]. 07/26/2023 12:45 PM Medical Record Number: [id number] Patient Account Number: [id number] Date of Birth/Sex: Treating RN: [birth year] (77 y.o. [name] Primary Care Provider: [name] Other Clinician: Betha Loa Referring Provider: Treating Provider/Extender: Hermine Messick Weeks in Treatment: 60 Constitutional Well-nourished and well-hydrated in no acute distress. Respiratory normal breathing without difficulty. Psychiatric this patient is able to make decisions and demonstrates good insight into disease process. Alert and Oriented x 3. pleasant and cooperative. Notes Upon inspection patient's wound bed actually showed signs of good granulation epithelization at this point. Fortunately I do not see any signs of active infection systemically and I think that in general he is really doing quite well. In fact the end of his foot I think may just be due to the Tubigrip squeezing right at the end of his foot causing this to swell more so. Working to see how this does over the next week with slight change.  nor locally HAWKINS, SEAMAN ([id number]) [id number] Page 6 of 13 Electronic Signature(s) Signed: 07/26/2023 1:46:49 PM  MOXON, MESSLER ([id number]) [id number] Page 1 of 13 Visit Report for 07/26/2023 Chief Complaint Document Details Patient Name: Date of Service: [name] 07/26/2023 12:45 PM Medical Record Number: [id number] Patient Account Number: [id number] Date of Birth/Sex: Treating RN: 12/29/1945 (77 y.o. [name]) [name] Primary Care Provider: [name] Other Clinician: Betha Loa Referring Provider: Treating Provider/Extender: [name] in Treatment: 8 Information Obtained from: Patient Chief Complaint Left heel ulcer Electronic Signature(s) Signed: 07/26/2023 12:49:33 PM By: [name] Derry PA-C Entered By: [name] Derry on 07/26/2023 09:49:33 -------------------------------------------------------------------------------- HPI Details Patient Name: Date of Service: [name], [name] 07/26/2023 12:45 PM Medical Record Number: [id number] Patient Account Number: [id number] Date of Birth/Sex: Treating RN: 1946/10/12 (77 y.o. [name] Primary Care Provider: [name] Other Clinician: Betha Loa Referring Provider: Treating Provider/Extender: [name] in Treatment: 17 History of Present Illness HPI Description: 09/24/2020 on evaluation today patient presents today for a heel ulcer that he tells me has been present for about 2 years. He has been seeing podiatry and they have been attempting to manage this including what sounds to be a total contact cast, Unna boot, and just standard dressings otherwise as well. Most recently has been using triple antibiotic ointment. With that being said unfortunately despite everything he really has not had any significant improvement. He tells me that he cannot even really remember exactly how this began but he presumed it may have rubbed on his shoes or something of that nature. 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 He tells me that this does seem to help. Fortunately there is no signs of active infection at this time. His most recent hemoglobin A1c was 8.1 though he knows this was this year he cannot tell me the exact time. His fluid pills currently to help with some of the lower extremity edema although he does obviously have signs of venous stasis/lymphedema. Currently there is no evidence of active infection. No fevers, chills, nausea, vomiting, or diarrhea. Patient has had fairly recent ABIs which were performed on 07/19/2020 and revealed that he has normal findings in both the ankle and toe locations bilaterally. His ABI on the right was 1.09 on the left was 1.08 with a TBI on the right of 0.88 and on the left of 0.94. Triphasic flow was noted throughout. 10/08/2020 on evaluation today patient appears to be doing pretty well in regard to his left heel currently in fact this is doing a great job and seems to be healing quite nicely. Unfortunately on his right leg he had a pile of wood that actually fell on him injuring his right leg this is somewhat erythematous has me concerned little bit about cellulitis though there is not really a good area to culture at this point. AVIS, MCMAHILL ([id number]) [id number] Page 2 of 13 10/24/2020 upon evaluation today patient appears to be doing well with regard to his heel ulcer. He is showing signs of improvement which is great news. His right leg is completely healed. Overall I feel like he is doing excellent and there is no signs of infection. 11/07/2020 upon evaluation today patient appears to be doing well with regard to  diabetic. We have been using Hydrofera Blue under Urgo K2 lite wrap he has been in his surgical shoe although he tells me it disintegrated and he came in on a regular running shoe today. 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 He is not wearing compression stocking 06-22-2023 upon evaluation today patient appears to be doing excellent in regard to his wound on the heel which is actually significantly smaller compared to last week's evaluation. Fortunately I do not see any signs of active infection locally or systemically at this time. 06-28-2023 upon evaluation today patient appears to be doing well currently in regard to his heel which is actually showing signs of improvement. Fortunately I do not see any evidence of active infection at this time which is great news and in general I do believe that we are making headway towards complete closure which is good news. No fevers, chills, nausea, vomiting, or diarrhea. 07-05-2023 upon evaluation today patient appears to be doing well currently in regard to his heel ulcer. This is actually very close I think that being completely healed. This is great news and overall very pleased with where we stand today. 07-12-2023 upon evaluation today patient appears to be doing well currently in regard to his wound. He in fact appears to be very close to complete resolution I am very pleased that regard. However on the tip of his foot there appears to be some issue here with inflammation secondary to infection. I am very concerned about the infection at the site. 07-19-2023 upon evaluation today patient appears to actually be doing well in  regard to his heel. Fortunately I do not see any signs of worsening overall. In general I think you are making headway towards closure. 07-26-2023 upon evaluation today patient appears to be doing well currently in regard to his wound. He has in fact completely healed and looks to be doing great. I still want to keep an eye on things due to the end of his foot being red although I think this may be secondary to the Tubigrip more than anything to be honest. Fortunately I do not see any signs of active infection at this time which is great news. No fevers, chills, nausea, vomiting, or diarrhea. Electronic Signature(s) Signed: 07/26/2023 1:46:25 PM By: [name] Derry PA-C Entered By: [name] Derry on 07/26/2023 10:46:25 -------------------------------------------------------------------------------- Physical Exam Details Patient Name: Date of Service: [name]. 07/26/2023 12:45 PM Medical Record Number: [id number] Patient Account Number: [id number] Date of Birth/Sex: Treating RN: [birth year] (77 y.o. [name] Primary Care Provider: [name] Other Clinician: Betha Loa Referring Provider: Treating Provider/Extender: Hermine Messick Weeks in Treatment: 60 Constitutional Well-nourished and well-hydrated in no acute distress. Respiratory normal breathing without difficulty. Psychiatric this patient is able to make decisions and demonstrates good insight into disease process. Alert and Oriented x 3. pleasant and cooperative. Notes Upon inspection patient's wound bed actually showed signs of good granulation epithelization at this point. Fortunately I do not see any signs of active infection systemically and I think that in general he is really doing quite well. In fact the end of his foot I think may just be due to the Tubigrip squeezing right at the end of his foot causing this to swell more so. Working to see how this does over the next week with slight change.  nor locally HAWKINS, SEAMAN ([id number]) [id number] Page 6 of 13 Electronic Signature(s) Signed: 07/26/2023 1:46:49 PM  MOXON, MESSLER ([id number]) [id number] Page 1 of 13 Visit Report for 07/26/2023 Chief Complaint Document Details Patient Name: Date of Service: [name] 07/26/2023 12:45 PM Medical Record Number: [id number] Patient Account Number: [id number] Date of Birth/Sex: Treating RN: 12/29/1945 (77 y.o. [name]) [name] Primary Care Provider: [name] Other Clinician: Betha Loa Referring Provider: Treating Provider/Extender: [name] in Treatment: 8 Information Obtained from: Patient Chief Complaint Left heel ulcer Electronic Signature(s) Signed: 07/26/2023 12:49:33 PM By: [name] Derry PA-C Entered By: [name] Derry on 07/26/2023 09:49:33 -------------------------------------------------------------------------------- HPI Details Patient Name: Date of Service: [name], [name] 07/26/2023 12:45 PM Medical Record Number: [id number] Patient Account Number: [id number] Date of Birth/Sex: Treating RN: 1946/10/12 (77 y.o. [name] Primary Care Provider: [name] Other Clinician: Betha Loa Referring Provider: Treating Provider/Extender: [name] in Treatment: 17 History of Present Illness HPI Description: 09/24/2020 on evaluation today patient presents today for a heel ulcer that he tells me has been present for about 2 years. He has been seeing podiatry and they have been attempting to manage this including what sounds to be a total contact cast, Unna boot, and just standard dressings otherwise as well. Most recently has been using triple antibiotic ointment. With that being said unfortunately despite everything he really has not had any significant improvement. He tells me that he cannot even really remember exactly how this began but he presumed it may have rubbed on his shoes or something of that nature. 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 He tells me that this does seem to help. Fortunately there is no signs of active infection at this time. His most recent hemoglobin A1c was 8.1 though he knows this was this year he cannot tell me the exact time. His fluid pills currently to help with some of the lower extremity edema although he does obviously have signs of venous stasis/lymphedema. Currently there is no evidence of active infection. No fevers, chills, nausea, vomiting, or diarrhea. Patient has had fairly recent ABIs which were performed on 07/19/2020 and revealed that he has normal findings in both the ankle and toe locations bilaterally. His ABI on the right was 1.09 on the left was 1.08 with a TBI on the right of 0.88 and on the left of 0.94. Triphasic flow was noted throughout. 10/08/2020 on evaluation today patient appears to be doing pretty well in regard to his left heel currently in fact this is doing a great job and seems to be healing quite nicely. Unfortunately on his right leg he had a pile of wood that actually fell on him injuring his right leg this is somewhat erythematous has me concerned little bit about cellulitis though there is not really a good area to culture at this point. AVIS, MCMAHILL ([id number]) [id number] Page 2 of 13 10/24/2020 upon evaluation today patient appears to be doing well with regard to his heel ulcer. He is showing signs of improvement which is great news. His right leg is completely healed. Overall I feel like he is doing excellent and there is no signs of infection. 11/07/2020 upon evaluation today patient appears to be doing well with regard to  MOXON, MESSLER ([id number]) [id number] Page 1 of 13 Visit Report for 07/26/2023 Chief Complaint Document Details Patient Name: Date of Service: [name] 07/26/2023 12:45 PM Medical Record Number: [id number] Patient Account Number: [id number] Date of Birth/Sex: Treating RN: 12/29/1945 (77 y.o. [name]) [name] Primary Care Provider: [name] Other Clinician: Betha Loa Referring Provider: Treating Provider/Extender: [name] in Treatment: 8 Information Obtained from: Patient Chief Complaint Left heel ulcer Electronic Signature(s) Signed: 07/26/2023 12:49:33 PM By: [name] Derry PA-C Entered By: [name] Derry on 07/26/2023 09:49:33 -------------------------------------------------------------------------------- HPI Details Patient Name: Date of Service: [name], [name] 07/26/2023 12:45 PM Medical Record Number: [id number] Patient Account Number: [id number] Date of Birth/Sex: Treating RN: 1946/10/12 (77 y.o. [name] Primary Care Provider: [name] Other Clinician: Betha Loa Referring Provider: Treating Provider/Extender: [name] in Treatment: 17 History of Present Illness HPI Description: 09/24/2020 on evaluation today patient presents today for a heel ulcer that he tells me has been present for about 2 years. He has been seeing podiatry and they have been attempting to manage this including what sounds to be a total contact cast, Unna boot, and just standard dressings otherwise as well. Most recently has been using triple antibiotic ointment. With that being said unfortunately despite everything he really has not had any significant improvement. He tells me that he cannot even really remember exactly how this began but he presumed it may have rubbed on his shoes or something of that nature. 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 He tells me that this does seem to help. Fortunately there is no signs of active infection at this time. His most recent hemoglobin A1c was 8.1 though he knows this was this year he cannot tell me the exact time. His fluid pills currently to help with some of the lower extremity edema although he does obviously have signs of venous stasis/lymphedema. Currently there is no evidence of active infection. No fevers, chills, nausea, vomiting, or diarrhea. Patient has had fairly recent ABIs which were performed on 07/19/2020 and revealed that he has normal findings in both the ankle and toe locations bilaterally. His ABI on the right was 1.09 on the left was 1.08 with a TBI on the right of 0.88 and on the left of 0.94. Triphasic flow was noted throughout. 10/08/2020 on evaluation today patient appears to be doing pretty well in regard to his left heel currently in fact this is doing a great job and seems to be healing quite nicely. Unfortunately on his right leg he had a pile of wood that actually fell on him injuring his right leg this is somewhat erythematous has me concerned little bit about cellulitis though there is not really a good area to culture at this point. AVIS, MCMAHILL ([id number]) [id number] Page 2 of 13 10/24/2020 upon evaluation today patient appears to be doing well with regard to his heel ulcer. He is showing signs of improvement which is great news. His right leg is completely healed. Overall I feel like he is doing excellent and there is no signs of infection. 11/07/2020 upon evaluation today patient appears to be doing well with regard to  this may be secondary to the Tubigrip more than anything to be honest. Fortunately I do not see any signs of active infection at this time which is great news. No fevers, chills, nausea, vomiting, or diarrhea. Objective Constitutional Well-nourished and well-hydrated in no acute distress. Vitals Time Taken: 1:02 PM, Height: 70 in, Weight: 265 lbs, BMI: 38, Temperature: 98.3 F, Pulse: 71 bpm, Respiratory Rate: 18 breaths/min, Blood Pressure: 153/57 mmHg. Respiratory normal breathing without difficulty. Psychiatric this patient is able to make decisions and demonstrates good insight into disease process. Alert and Oriented x 3. pleasant and cooperative. General Notes: Upon inspection patient's wound bed actually showed signs of good granulation epithelization at this point. Fortunately I do not see any signs of active infection systemically and I think that in general he is really doing quite well. In fact the end of his foot I think may just be due to the Tubigrip squeezing right at the end of his foot causing this to swell more so. Working to see how this does over the next week with slight change. nor locally Integumentary (Hair, Skin) Wound #10 status is Healed - Epithelialized. Original cause of wound was Gradually Appeared. The date acquired was: 06/05/2022. The wound has been in treatment 57 weeks. The wound is located on the Left Calcaneus. The wound measures 0cm length x 0cm width x 0cm depth; 0cm^2 area and 0cm^3 volume. There is Fat Layer (Subcutaneous  Tissue) exposed. There is a none present amount of drainage noted. The wound margin is flat and intact. There is no granulation within the wound bed. There is no necrotic tissue within the wound bed. 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 Bathing/ Shower/ Hygiene: May shower; gently cleanse wound with antibacterial soap, rinse and pat dry prior to dressing wounds Additional Orders / Instructions: Other: - apply Urea cream to heels at night, use heel cup for protection Medications-Please add to medication list.: P.O. Antibiotics - finish Bactrim DS 1. I would recommend that he continue with using the urea cream to the heel which I think is softening things up the heel looks much better. 2. I am going to recommend as well that he should continue with the heel cup for protection. Were 3 and also can recommend that he finish off his antibiotics. 4. We will use the Tubigrip just a single-layer size C and on the end of his foot he will double that back in order to keep the end of the foot from swelling and we will see how this looks come next week. We will see patient back for reevaluation in 1 week here in the clinic. If anything worsens or changes patient will contact our office for additional recommendations. Electronic Signature(s) Signed: 07/26/2023 1:47:19 PM By: [name] Derry PA-C Entered By: [name] Derry on 07/26/2023 10:47:19 -------------------------------------------------------------------------------- SuperBill Details Patient Name: Date of Service: [name], [name] 07/26/2023 Medical Record Number: [id number] Patient Account Number: [id number] Date of Birth/Sex: Treating  RN: September 12, 1946 (77 y.o. [name]) [name] Primary Care Provider: [name] Other Clinician: Betha Loa Referring Provider: Treating Provider/Extender: [name] in Treatment: 57 Diagnosis Coding ICD-10 Codes Code Description E11.621 Type 2 diabetes mellitus with foot ulcer L97.522 Non-pressure chronic ulcer of other part of left  By: [name] Derry PA-C Entered By: [name] Derry on 07/26/2023 10:46:48 -------------------------------------------------------------------------------- Physician Orders Details Patient Name: Date of Service: [name], [name] 07/26/2023 12:45 PM Medical Record Number: [id number] Patient Account Number: [id number] Date of Birth/Sex: Treating RN: 08-18-1946 (77 y.o. [name]) [name] Primary Care Provider: [name] Other Clinician: Betha Loa Referring Provider: Treating Provider/Extender: [name] in Treatment: 66 Verbal / Phone Orders: Yes Clinician: [name] Read Back and Verified: Yes Diagnosis Coding ICD-10 Coding Code Description E11.621 Type 2 diabetes mellitus with foot ulcer L97.522 Non-pressure chronic ulcer of other part of left foot with fat layer exposed E11.42 Type 2 diabetes mellitus with diabetic polyneuropathy Follow-up Appointments Return Appointment in 1 week. Bathing/ Shower/ Hygiene May shower; gently cleanse wound with antibacterial soap, rinse and pat dry prior to dressing wounds Additional Orders / Instructions Other: - apply Urea cream to heels at night, use heel cup for protection Medications-Please add to medication list. ntibiotics - finish Bactrim DS P.O. A Electronic Signature(s) Signed: 07/26/2023 3:41:41 PM By: [name] Derry PA-C Signed: 07/26/2023 4:27:08 PM By: Betha Loa Entered By: Betha Loa on 07/26/2023 10:35:44 -------------------------------------------------------------------------------- Problem List Details Patient Name: Date of Service: [name], [name] 07/26/2023 12:45 PM Medical Record Number: [id number] Patient Account Number: [id number] Date of Birth/Sex: Treating RN: 09-04-1946 (77 y.o. [name] Primary Care Provider: [name] Other Clinician: Betha Loa Referring Provider: Treating Provider/Extender: [name] in Treatment: [address] [place] ([id number]) [id number] Page 7 of 13 Active Problems ICD-10 Encounter Code Description Active Date MDM Diagnosis E11.621 Type 2 diabetes mellitus with foot ulcer 06/19/2022 No Yes L97.522 Non-pressure chronic ulcer of other part of left foot with fat layer exposed 06/19/2022 No Yes E11.42 Type 2 diabetes mellitus with diabetic polyneuropathy 06/19/2022 No Yes Inactive Problems Resolved Problems Electronic Signature(s) Signed: 07/26/2023 12:49:30 PM By: [name] Derry PA-C Entered By: [name] Derry on 07/26/2023 09:49:30 -------------------------------------------------------------------------------- Progress Note Details Patient Name: Date of Service: [name], [name] 07/26/2023 12:45 PM Medical Record Number: [id number] Patient Account Number: [id number] Date of Birth/Sex: Treating RN: 07/23/1946 (77 y.o. [name] Primary Care Provider: [name] Other Clinician: Betha Loa Referring Provider: Treating Provider/Extender: [name] in Treatment: 57 Subjective Chief Complaint Information obtained from Patient Left heel ulcer History of Present Illness (HPI) 09/24/2020 on evaluation today patient presents today for a heel ulcer that he tells me has been present for about 2 years. He has been seeing podiatry and they have been attempting to manage this including what sounds to be a total contact cast, Unna boot, and just standard dressings otherwise as well. Most recently has been using triple antibiotic ointment. With that being said unfortunately despite everything he really has not had any significant improvement. He tells me that he cannot even really remember exactly how this began but he presumed it may have rubbed on his shoes or something of that nature. 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 He tells me that this does seem to help. Fortunately there is no signs of active infection at this time. His most recent hemoglobin A1c was 8.1 though he knows this was this  this may be secondary to the Tubigrip more than anything to be honest. Fortunately I do not see any signs of active infection at this time which is great news. No fevers, chills, nausea, vomiting, or diarrhea. Objective Constitutional Well-nourished and well-hydrated in no acute distress. Vitals Time Taken: 1:02 PM, Height: 70 in, Weight: 265 lbs, BMI: 38, Temperature: 98.3 F, Pulse: 71 bpm, Respiratory Rate: 18 breaths/min, Blood Pressure: 153/57 mmHg. Respiratory normal breathing without difficulty. Psychiatric this patient is able to make decisions and demonstrates good insight into disease process. Alert and Oriented x 3. pleasant and cooperative. General Notes: Upon inspection patient's wound bed actually showed signs of good granulation epithelization at this point. Fortunately I do not see any signs of active infection systemically and I think that in general he is really doing quite well. In fact the end of his foot I think may just be due to the Tubigrip squeezing right at the end of his foot causing this to swell more so. Working to see how this does over the next week with slight change. nor locally Integumentary (Hair, Skin) Wound #10 status is Healed - Epithelialized. Original cause of wound was Gradually Appeared. The date acquired was: 06/05/2022. The wound has been in treatment 57 weeks. The wound is located on the Left Calcaneus. The wound measures 0cm length x 0cm width x 0cm depth; 0cm^2 area and 0cm^3 volume. There is Fat Layer (Subcutaneous  Tissue) exposed. There is a none present amount of drainage noted. The wound margin is flat and intact. There is no granulation within the wound bed. There is no necrotic tissue within the wound bed. 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 Bathing/ Shower/ Hygiene: May shower; gently cleanse wound with antibacterial soap, rinse and pat dry prior to dressing wounds Additional Orders / Instructions: Other: - apply Urea cream to heels at night, use heel cup for protection Medications-Please add to medication list.: P.O. Antibiotics - finish Bactrim DS 1. I would recommend that he continue with using the urea cream to the heel which I think is softening things up the heel looks much better. 2. I am going to recommend as well that he should continue with the heel cup for protection. Were 3 and also can recommend that he finish off his antibiotics. 4. We will use the Tubigrip just a single-layer size C and on the end of his foot he will double that back in order to keep the end of the foot from swelling and we will see how this looks come next week. We will see patient back for reevaluation in 1 week here in the clinic. If anything worsens or changes patient will contact our office for additional recommendations. Electronic Signature(s) Signed: 07/26/2023 1:47:19 PM By: [name] Derry PA-C Entered By: [name] Derry on 07/26/2023 10:47:19 -------------------------------------------------------------------------------- SuperBill Details Patient Name: Date of Service: [name], [name] 07/26/2023 Medical Record Number: [id number] Patient Account Number: [id number] Date of Birth/Sex: Treating  RN: September 12, 1946 (77 y.o. [name]) [name] Primary Care Provider: [name] Other Clinician: Betha Loa Referring Provider: Treating Provider/Extender: [name] in Treatment: 57 Diagnosis Coding ICD-10 Codes Code Description E11.621 Type 2 diabetes mellitus with foot ulcer L97.522 Non-pressure chronic ulcer of other part of left  diabetic. We have been using Hydrofera Blue under Urgo K2 lite wrap he has been in his surgical shoe although he tells me it disintegrated and he came in on a regular running shoe today. 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 He is not wearing compression stocking 06-22-2023 upon evaluation today patient appears to be doing excellent in regard to his wound on the heel which is actually significantly smaller compared to last week's evaluation. Fortunately I do not see any signs of active infection locally or systemically at this time. 06-28-2023 upon evaluation today patient appears to be doing well currently in regard to his heel which is actually showing signs of improvement. Fortunately I do not see any evidence of active infection at this time which is great news and in general I do believe that we are making headway towards complete closure which is good news. No fevers, chills, nausea, vomiting, or diarrhea. 07-05-2023 upon evaluation today patient appears to be doing well currently in regard to his heel ulcer. This is actually very close I think that being completely healed. This is great news and overall very pleased with where we stand today. 07-12-2023 upon evaluation today patient appears to be doing well currently in regard to his wound. He in fact appears to be very close to complete resolution I am very pleased that regard. However on the tip of his foot there appears to be some issue here with inflammation secondary to infection. I am very concerned about the infection at the site. 07-19-2023 upon evaluation today patient appears to actually be doing well in  regard to his heel. Fortunately I do not see any signs of worsening overall. In general I think you are making headway towards closure. 07-26-2023 upon evaluation today patient appears to be doing well currently in regard to his wound. He has in fact completely healed and looks to be doing great. I still want to keep an eye on things due to the end of his foot being red although I think this may be secondary to the Tubigrip more than anything to be honest. Fortunately I do not see any signs of active infection at this time which is great news. No fevers, chills, nausea, vomiting, or diarrhea. Electronic Signature(s) Signed: 07/26/2023 1:46:25 PM By: [name] Derry PA-C Entered By: [name] Derry on 07/26/2023 10:46:25 -------------------------------------------------------------------------------- Physical Exam Details Patient Name: Date of Service: [name]. 07/26/2023 12:45 PM Medical Record Number: [id number] Patient Account Number: [id number] Date of Birth/Sex: Treating RN: [birth year] (77 y.o. [name] Primary Care Provider: [name] Other Clinician: Betha Loa Referring Provider: Treating Provider/Extender: Hermine Messick Weeks in Treatment: 60 Constitutional Well-nourished and well-hydrated in no acute distress. Respiratory normal breathing without difficulty. Psychiatric this patient is able to make decisions and demonstrates good insight into disease process. Alert and Oriented x 3. pleasant and cooperative. Notes Upon inspection patient's wound bed actually showed signs of good granulation epithelization at this point. Fortunately I do not see any signs of active infection systemically and I think that in general he is really doing quite well. In fact the end of his foot I think may just be due to the Tubigrip squeezing right at the end of his foot causing this to swell more so. Working to see how this does over the next week with slight change.  nor locally HAWKINS, SEAMAN ([id number]) [id number] Page 6 of 13 Electronic Signature(s) Signed: 07/26/2023 1:46:49 PM  MOXON, MESSLER ([id number]) [id number] Page 1 of 13 Visit Report for 07/26/2023 Chief Complaint Document Details Patient Name: Date of Service: [name] 07/26/2023 12:45 PM Medical Record Number: [id number] Patient Account Number: [id number] Date of Birth/Sex: Treating RN: 12/29/1945 (77 y.o. [name]) [name] Primary Care Provider: [name] Other Clinician: Betha Loa Referring Provider: Treating Provider/Extender: [name] in Treatment: 8 Information Obtained from: Patient Chief Complaint Left heel ulcer Electronic Signature(s) Signed: 07/26/2023 12:49:33 PM By: [name] Derry PA-C Entered By: [name] Derry on 07/26/2023 09:49:33 -------------------------------------------------------------------------------- HPI Details Patient Name: Date of Service: [name], [name] 07/26/2023 12:45 PM Medical Record Number: [id number] Patient Account Number: [id number] Date of Birth/Sex: Treating RN: 1946/10/12 (77 y.o. [name] Primary Care Provider: [name] Other Clinician: Betha Loa Referring Provider: Treating Provider/Extender: [name] in Treatment: 17 History of Present Illness HPI Description: 09/24/2020 on evaluation today patient presents today for a heel ulcer that he tells me has been present for about 2 years. He has been seeing podiatry and they have been attempting to manage this including what sounds to be a total contact cast, Unna boot, and just standard dressings otherwise as well. Most recently has been using triple antibiotic ointment. With that being said unfortunately despite everything he really has not had any significant improvement. He tells me that he cannot even really remember exactly how this began but he presumed it may have rubbed on his shoes or something of that nature. 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 He tells me that this does seem to help. Fortunately there is no signs of active infection at this time. His most recent hemoglobin A1c was 8.1 though he knows this was this year he cannot tell me the exact time. His fluid pills currently to help with some of the lower extremity edema although he does obviously have signs of venous stasis/lymphedema. Currently there is no evidence of active infection. No fevers, chills, nausea, vomiting, or diarrhea. Patient has had fairly recent ABIs which were performed on 07/19/2020 and revealed that he has normal findings in both the ankle and toe locations bilaterally. His ABI on the right was 1.09 on the left was 1.08 with a TBI on the right of 0.88 and on the left of 0.94. Triphasic flow was noted throughout. 10/08/2020 on evaluation today patient appears to be doing pretty well in regard to his left heel currently in fact this is doing a great job and seems to be healing quite nicely. Unfortunately on his right leg he had a pile of wood that actually fell on him injuring his right leg this is somewhat erythematous has me concerned little bit about cellulitis though there is not really a good area to culture at this point. AVIS, MCMAHILL ([id number]) [id number] Page 2 of 13 10/24/2020 upon evaluation today patient appears to be doing well with regard to his heel ulcer. He is showing signs of improvement which is great news. His right leg is completely healed. Overall I feel like he is doing excellent and there is no signs of infection. 11/07/2020 upon evaluation today patient appears to be doing well with regard to  this may be secondary to the Tubigrip more than anything to be honest. Fortunately I do not see any signs of active infection at this time which is great news. No fevers, chills, nausea, vomiting, or diarrhea. Objective Constitutional Well-nourished and well-hydrated in no acute distress. Vitals Time Taken: 1:02 PM, Height: 70 in, Weight: 265 lbs, BMI: 38, Temperature: 98.3 F, Pulse: 71 bpm, Respiratory Rate: 18 breaths/min, Blood Pressure: 153/57 mmHg. Respiratory normal breathing without difficulty. Psychiatric this patient is able to make decisions and demonstrates good insight into disease process. Alert and Oriented x 3. pleasant and cooperative. General Notes: Upon inspection patient's wound bed actually showed signs of good granulation epithelization at this point. Fortunately I do not see any signs of active infection systemically and I think that in general he is really doing quite well. In fact the end of his foot I think may just be due to the Tubigrip squeezing right at the end of his foot causing this to swell more so. Working to see how this does over the next week with slight change. nor locally Integumentary (Hair, Skin) Wound #10 status is Healed - Epithelialized. Original cause of wound was Gradually Appeared. The date acquired was: 06/05/2022. The wound has been in treatment 57 weeks. The wound is located on the Left Calcaneus. The wound measures 0cm length x 0cm width x 0cm depth; 0cm^2 area and 0cm^3 volume. There is Fat Layer (Subcutaneous  Tissue) exposed. There is a none present amount of drainage noted. The wound margin is flat and intact. There is no granulation within the wound bed. There is no necrotic tissue within the wound bed. 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 Bathing/ Shower/ Hygiene: May shower; gently cleanse wound with antibacterial soap, rinse and pat dry prior to dressing wounds Additional Orders / Instructions: Other: - apply Urea cream to heels at night, use heel cup for protection Medications-Please add to medication list.: P.O. Antibiotics - finish Bactrim DS 1. I would recommend that he continue with using the urea cream to the heel which I think is softening things up the heel looks much better. 2. I am going to recommend as well that he should continue with the heel cup for protection. Were 3 and also can recommend that he finish off his antibiotics. 4. We will use the Tubigrip just a single-layer size C and on the end of his foot he will double that back in order to keep the end of the foot from swelling and we will see how this looks come next week. We will see patient back for reevaluation in 1 week here in the clinic. If anything worsens or changes patient will contact our office for additional recommendations. Electronic Signature(s) Signed: 07/26/2023 1:47:19 PM By: [name] Derry PA-C Entered By: [name] Derry on 07/26/2023 10:47:19 -------------------------------------------------------------------------------- SuperBill Details Patient Name: Date of Service: [name], [name] 07/26/2023 Medical Record Number: [id number] Patient Account Number: [id number] Date of Birth/Sex: Treating  RN: September 12, 1946 (77 y.o. [name]) [name] Primary Care Provider: [name] Other Clinician: Betha Loa Referring Provider: Treating Provider/Extender: [name] in Treatment: 57 Diagnosis Coding ICD-10 Codes Code Description E11.621 Type 2 diabetes mellitus with foot ulcer L97.522 Non-pressure chronic ulcer of other part of left  MOXON, MESSLER ([id number]) [id number] Page 1 of 13 Visit Report for 07/26/2023 Chief Complaint Document Details Patient Name: Date of Service: [name] J. 07/26/2023 12:45 PM Medical Record Number: [id number] Patient Account Number: [id number] Date of Birth/Sex: Treating RN: 12/29/1945 (77 y.o. [name]) [name] Primary Care Provider: [name] Other Clinician: Betha Loa Referring Provider: Treating Provider/Extender: [name] in Treatment: 8 Information Obtained from: Patient Chief Complaint Left heel ulcer Electronic Signature(s) Signed: 07/26/2023 12:49:33 PM By: [name] Derry PA-C Entered By: [name] Derry on 07/26/2023 09:49:33 -------------------------------------------------------------------------------- HPI Details Patient Name: Date of Service: [name], [name] 07/26/2023 12:45 PM Medical Record Number: [id number] Patient Account Number: [id number] Date of Birth/Sex: Treating RN: 1946/10/12 (77 y.o. [name] Primary Care Provider: [name] Other Clinician: Betha Loa Referring Provider: Treating Provider/Extender: [name] in Treatment: 17 History of Present Illness HPI Description: 09/24/2020 on evaluation today patient presents today for a heel ulcer that he tells me has been present for about 2 years. He has been seeing podiatry and they have been attempting to manage this including what sounds to be a total contact cast, Unna boot, and just standard dressings otherwise as well. Most recently has been using triple antibiotic ointment. With that being said unfortunately despite everything he really has not had any significant improvement. He tells me that he cannot even really remember exactly how this began but he presumed it may have rubbed on his shoes or something of that nature. 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 He tells me that this does seem to help. Fortunately there is no signs of active infection at this time. His most recent hemoglobin A1c was 8.1 though he knows this was this year he cannot tell me the exact time. His fluid pills currently to help with some of the lower extremity edema although he does obviously have signs of venous stasis/lymphedema. Currently there is no evidence of active infection. No fevers, chills, nausea, vomiting, or diarrhea. Patient has had fairly recent ABIs which were performed on 07/19/2020 and revealed that he has normal findings in both the ankle and toe locations bilaterally. His ABI on the right was 1.09 on the left was 1.08 with a TBI on the right of 0.88 and on the left of 0.94. Triphasic flow was noted throughout. 10/08/2020 on evaluation today patient appears to be doing pretty well in regard to his left heel currently in fact this is doing a great job and seems to be healing quite nicely. Unfortunately on his right leg he had a pile of wood that actually fell on him injuring his right leg this is somewhat erythematous has me concerned little bit about cellulitis though there is not really a good area to culture at this point. AVIS, MCMAHILL ([id number]) [id number] Page 2 of 13 10/24/2020 upon evaluation today patient appears to be doing well with regard to his heel ulcer. He is showing signs of improvement which is great news. His right leg is completely healed. Overall I feel like he is doing excellent and there is no signs of infection. 11/07/2020 upon evaluation today patient appears to be doing well with regard to  diabetic. We have been using Hydrofera Blue under Urgo K2 lite wrap he has been in his surgical shoe although he tells me it disintegrated and he came in on a regular running shoe today. 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 He is not wearing compression stocking 06-22-2023 upon evaluation today patient appears to be doing excellent in regard to his wound on the heel which is actually significantly smaller compared to last week's evaluation. Fortunately I do not see any signs of active infection locally or systemically at this time. 06-28-2023 upon evaluation today patient appears to be doing well currently in regard to his heel which is actually showing signs of improvement. Fortunately I do not see any evidence of active infection at this time which is great news and in general I do believe that we are making headway towards complete closure which is good news. No fevers, chills, nausea, vomiting, or diarrhea. 07-05-2023 upon evaluation today patient appears to be doing well currently in regard to his heel ulcer. This is actually very close I think that being completely healed. This is great news and overall very pleased with where we stand today. 07-12-2023 upon evaluation today patient appears to be doing well currently in regard to his wound. He in fact appears to be very close to complete resolution I am very pleased that regard. However on the tip of his foot there appears to be some issue here with inflammation secondary to infection. I am very concerned about the infection at the site. 07-19-2023 upon evaluation today patient appears to actually be doing well in  regard to his heel. Fortunately I do not see any signs of worsening overall. In general I think you are making headway towards closure. 07-26-2023 upon evaluation today patient appears to be doing well currently in regard to his wound. He has in fact completely healed and looks to be doing great. I still want to keep an eye on things due to the end of his foot being red although I think this may be secondary to the Tubigrip more than anything to be honest. Fortunately I do not see any signs of active infection at this time which is great news. No fevers, chills, nausea, vomiting, or diarrhea. Electronic Signature(s) Signed: 07/26/2023 1:46:25 PM By: [name] Derry PA-C Entered By: [name] Derry on 07/26/2023 10:46:25 -------------------------------------------------------------------------------- Physical Exam Details Patient Name: Date of Service: [name] [facility] J. 07/26/2023 12:45 PM Medical Record Number: [id number] Patient Account Number: [id number] Date of Birth/Sex: Treating RN: [birth year] (77 y.o. [name] Primary Care Provider: [name] Other Clinician: Betha Loa Referring Provider: Treating Provider/Extender: Hermine Messick Weeks in Treatment: 60 Constitutional Well-nourished and well-hydrated in no acute distress. Respiratory normal breathing without difficulty. Psychiatric this patient is able to make decisions and demonstrates good insight into disease process. Alert and Oriented x 3. pleasant and cooperative. Notes Upon inspection patient's wound bed actually showed signs of good granulation epithelization at this point. Fortunately I do not see any signs of active infection systemically and I think that in general he is really doing quite well. In fact the end of his foot I think may just be due to the Tubigrip squeezing right at the end of his foot causing this to swell more so. Working to see how this does over the next week with slight change.  nor locally HAWKINS, SEAMAN ([id number]) [id number] Page 6 of 13 Electronic Signature(s) Signed: 07/26/2023 1:46:49 PM

## 2023-07-26 NOTE — Progress Notes (Addendum)
[name], [name] ([id number]) [id number] Page 1 of 9 Visit Report for 07/26/2023 Arrival Information Details Patient Name: Date of Service: [name] [facility] J. 07/26/2023 12:45 PM Medical Record Number: [id number] Patient Account Number: [id number] Date of Birth/Sex: Treating RN: [birth year] (77 y.o. [name]) [name] Primary Care [name]: [name] Other Clinician: Betha [name] Referring [name]: Treating [name]/Extender: [name] in Treatment: 50 Visit Information History Since Last Visit All ordered tests and consults were completed: No Patient Arrived: [name] Added or deleted any medications: No Arrival Time: 12:59 Any new allergies or adverse reactions: No Transfer Assistance: None Had a fall or experienced change in No Patient Identification Verified: Yes activities of daily living that may affect Secondary Verification Process Completed: Yes risk of falls: Patient Requires Transmission-Based Precautions: No Signs or symptoms of abuse/neglect since last visito No Patient Has Alerts: Yes Hospitalized since last visit: No Patient Alerts: Patient on Blood Thinner Implantable device outside of the clinic excluding No Warfarin cellular tissue based products placed in the center Type II Diabetic since last visit: Has Dressing in Place as Prescribed: Yes Has Compression in Place as Prescribed: Yes Pain Present Now: No Electronic Signature(s) Signed: 07/26/2023 4:27:08 PM By: [name] Entered By: [name] on 07/26/2023 10:00:23 -------------------------------------------------------------------------------- Clinic Level of Care Assessment Details Patient Name: Date of Service: [name] [facility] J. 07/26/2023 12:45 PM Medical Record Number: [id number] Patient Account Number: [id number] Date of Birth/Sex: Treating RN: May 12, 1946 (77 y.o. [name] Primary Care [name]: [name] Other  Clinician: Betha [name] Referring [name]: Treating [name]/Extender: [name] in Treatment: 57 Clinic Level of Care Assessment Items TOOL 4 Quantity Score []  - 0 Use when only an EandM is performed on FOLLOW-UP visit ASSESSMENTS - Nursing Assessment / Reassessment X- 1 10 Reassessment of Co-morbidities (includes updates in patient status) [name] ([id number]) [phone].pdf Page 2 of 9 X- 1 5 Reassessment of Adherence to Treatment Plan ASSESSMENTS - Wound and Skin A ssessment / Reassessment X - Simple Wound Assessment / Reassessment - one wound 1 5 []  - 0 Complex Wound Assessment / Reassessment - multiple wounds []  - 0 Dermatologic / Skin Assessment (not related to wound area) ASSESSMENTS - Focused Assessment []  - 0 Circumferential Edema Measurements - multi extremities []  - 0 Nutritional Assessment / Counseling / Intervention []  - 0 Lower Extremity Assessment (monofilament, tuning fork, pulses) []  - 0 Peripheral Arterial Disease Assessment (using hand held doppler) ASSESSMENTS - Ostomy and/or Continence Assessment and Care []  - 0 Incontinence Assessment and Management []  - 0 Ostomy Care Assessment and Management (repouching, etc.) PROCESS - Coordination of Care X - Simple Patient / Family Education for ongoing care 1 15 []  - 0 Complex (extensive) Patient / Family Education for ongoing care []  - 0 Staff obtains Chiropractor, Records, T Results / Process Orders est []  - 0 Staff telephones HHA, Nursing Homes / Clarify orders / etc []  - 0 Routine Transfer to another Facility (non-emergent condition) []  - 0 Routine Hospital Admission (non-emergent condition) []  - 0 New Admissions / Manufacturing engineer / Ordering NPWT Apligraf, etc. , []  - 0 Emergency Hospital Admission (emergent condition) X- 1 10 Simple Discharge Coordination []  - 0 Complex (extensive) Discharge Coordination PROCESS - Special  Needs []  - 0 Pediatric / Minor Patient Management []  - 0 Isolation Patient Management []  - 0 Hearing / Language / Visual special needs []  - 0 Assessment of Community assistance (transportation, D/C planning, etc.) []  - 0  57 Vital Signs Height(in): 70 Pulse(bpm): 71 Weight(lbs): 265 Blood Pressure(mmHg): 153/57 Body Mass Index(BMI): 38 Temperature(F): 98.3 Respiratory Rate(breaths/min): [address] ([id number]):Wound Assessments] [[id number].pdf Page 5 of [phone].pdf Page 5 of 9] Wound Number: 10 N/A N/A Photos: N/A N/A Left Calcaneus N/A N/A Wound Location: Gradually Appeared N/A N/A Wounding 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 N/A N/A Secondary Etiology: Cataracts, Arrhythmia, Coronary N/A N/A Comorbid History: Artery Disease, Hypertension, Type II Diabetes, Osteoarthritis, Neuropathy 06/05/2022 N/A N/A Date Acquired: 72 N/A N/A Weeks of Treatment: Healed - Epithelialized N/A N/A Wound Status: No N/A N/A Wound Recurrence: 0x0x0 N/A N/A Measurements L x W x D (cm) 0 N/A N/A A (cm) : rea 0 N/A N/A Volume (cm) : 100.00% N/A N/A % Reduction in A rea: 100.00% N/A N/A % Reduction in Volume: Grade 1 N/A N/A Classification: None Present N/A N/A Exudate A mount: Flat and Intact N/A N/A Wound Margin: None Present (0%) N/A N/A Granulation A mount: None Present (0%) N/A N/A Necrotic A mount: Fat  Layer (Subcutaneous Tissue): Yes N/A N/A Exposed Structures: Fascia: No Tendon: No Muscle: No Joint: No Bone: No Large (67-100%) N/A N/A Epithelialization: Treatment Notes Electronic Signature(s) Signed: 07/26/2023 4:27:08 PM By: [name] Entered By: [name] on 07/26/2023 13:14:12 -------------------------------------------------------------------------------- Multi-Disciplinary Care Plan Details Patient Name: Date of Service: [name], [name] [name] 07/26/2023 12:45 PM Medical Record Number: [id number] Patient Account Number: [id number] Date of Birth/Sex: Treating RN: 20-Mar-1946 (77 y.o. [name]) [name] Primary Care [name]: [name] Other Clinician: Betha [name] Referring Stephanne Greeley: Treating Rosamaria Donn/Extender: [name] in Treatment: 52 Active Inactive Wound/Skin Impairment Nursing Diagnoses: Impaired tissue integrity MILOS, BEVANS ([id number]) [id number] Page 6 of 9 Goals: Patient/caregiver will verbalize understanding of skin care regimen Date Initiated: 06/19/2022 Target Resolution Date: 07/19/2022 Goal Status: Active Ulcer/skin breakdown will have a volume reduction of 30% by week 4 Date Initiated: 06/19/2022 Date Inactivated: 07/28/2022 Target Resolution Date: 07/17/2022 Goal Status: Unmet Unmet Reason: infection Ulcer/skin breakdown will have a volume reduction of 50% by week 8 Date Initiated: 07/28/2022 Date Inactivated: 05/18/2023 Target Resolution Date: 03/20/2023 Goal Status: Unmet Unmet Reason: comorbidities Interventions: Assess patient/caregiver ability to obtain necessary supplies Assess patient/caregiver ability to perform ulcer/skin care regimen upon admission and as needed Assess ulceration(s) every visit Provide education on ulcer and skin care Treatment Activities: Skin care regimen initiated : 06/19/2022 Topical wound management initiated : 06/19/2022 Notes: Electronic  Signature(s) Signed: 07/26/2023 4:27:08 PM By: [name] Signed: 08/16/2023 8:01:26 AM By: [name] Pax RN Entered By: [name] on 07/26/2023 10:48:06 -------------------------------------------------------------------------------- Pain Assessment Details Patient Name: Date of Service: [name], [name] [name] 07/26/2023 12:45 PM Medical Record Number: [id number] Patient Account Number: [id number] Date of Birth/Sex: Treating RN: 27-Apr-1946 (77 y.o. [name] Primary Care Shakayla Hickox: [name] Other Clinician: Betha [name] Referring Aldyn Toon: Treating Namari Breton/Extender: [name] in Treatment: 57 Active Problems Location of Pain Severity and Description of Pain Patient Has Paino No Site Locations Pain Management and Medication JARUIS, CASSELBERRY ([id number]) [phone].pdf Page 7 of 9 Current Pain Management: Electronic Signature(s) Signed: 07/26/2023 4:27:08 PM By: [name] Signed: 08/16/2023 8:01:26 AM By: [name] Pax RN Entered By: [name] on 07/26/2023 10:12:52 -------------------------------------------------------------------------------- Patient/Caregiver Education Details Patient Name: Date of Service: [name], [name] [name] 10/7/2024andnbsp12:45 PM Medical Record Number: [id number] Patient Account Number: [id number] Date of Birth/Gender: Treating RN: 04-08-1946 (77 y.o. [name]) [name] Primary Care Physician: Judithann Sheen,  [name], [name] ([id number]) [id number] Page 1 of 9 Visit Report for 07/26/2023 Arrival Information Details Patient Name: Date of Service: [name] [facility] J. 07/26/2023 12:45 PM Medical Record Number: [id number] Patient Account Number: [id number] Date of Birth/Sex: Treating RN: [birth year] (77 y.o. [name]) [name] Primary Care [name]: [name] Other Clinician: Betha [name] Referring [name]: Treating [name]/Extender: [name] in Treatment: 50 Visit Information History Since Last Visit All ordered tests and consults were completed: No Patient Arrived: [name] Added or deleted any medications: No Arrival Time: 12:59 Any new allergies or adverse reactions: No Transfer Assistance: None Had a fall or experienced change in No Patient Identification Verified: Yes activities of daily living that may affect Secondary Verification Process Completed: Yes risk of falls: Patient Requires Transmission-Based Precautions: No Signs or symptoms of abuse/neglect since last visito No Patient Has Alerts: Yes Hospitalized since last visit: No Patient Alerts: Patient on Blood Thinner Implantable device outside of the clinic excluding No Warfarin cellular tissue based products placed in the center Type II Diabetic since last visit: Has Dressing in Place as Prescribed: Yes Has Compression in Place as Prescribed: Yes Pain Present Now: No Electronic Signature(s) Signed: 07/26/2023 4:27:08 PM By: [name] Entered By: [name] on 07/26/2023 10:00:23 -------------------------------------------------------------------------------- Clinic Level of Care Assessment Details Patient Name: Date of Service: [name] [facility] J. 07/26/2023 12:45 PM Medical Record Number: [id number] Patient Account Number: [id number] Date of Birth/Sex: Treating RN: May 12, 1946 (77 y.o. [name] Primary Care [name]: [name] Other  Clinician: Betha [name] Referring [name]: Treating [name]/Extender: [name] in Treatment: 57 Clinic Level of Care Assessment Items TOOL 4 Quantity Score []  - 0 Use when only an EandM is performed on FOLLOW-UP visit ASSESSMENTS - Nursing Assessment / Reassessment X- 1 10 Reassessment of Co-morbidities (includes updates in patient status) [name] ([id number]) [phone].pdf Page 2 of 9 X- 1 5 Reassessment of Adherence to Treatment Plan ASSESSMENTS - Wound and Skin A ssessment / Reassessment X - Simple Wound Assessment / Reassessment - one wound 1 5 []  - 0 Complex Wound Assessment / Reassessment - multiple wounds []  - 0 Dermatologic / Skin Assessment (not related to wound area) ASSESSMENTS - Focused Assessment []  - 0 Circumferential Edema Measurements - multi extremities []  - 0 Nutritional Assessment / Counseling / Intervention []  - 0 Lower Extremity Assessment (monofilament, tuning fork, pulses) []  - 0 Peripheral Arterial Disease Assessment (using hand held doppler) ASSESSMENTS - Ostomy and/or Continence Assessment and Care []  - 0 Incontinence Assessment and Management []  - 0 Ostomy Care Assessment and Management (repouching, etc.) PROCESS - Coordination of Care X - Simple Patient / Family Education for ongoing care 1 15 []  - 0 Complex (extensive) Patient / Family Education for ongoing care []  - 0 Staff obtains Chiropractor, Records, T Results / Process Orders est []  - 0 Staff telephones HHA, Nursing Homes / Clarify orders / etc []  - 0 Routine Transfer to another Facility (non-emergent condition) []  - 0 Routine Hospital Admission (non-emergent condition) []  - 0 New Admissions / Manufacturing engineer / Ordering NPWT Apligraf, etc. , []  - 0 Emergency Hospital Admission (emergent condition) X- 1 10 Simple Discharge Coordination []  - 0 Complex (extensive) Discharge Coordination PROCESS - Special  Needs []  - 0 Pediatric / Minor Patient Management []  - 0 Isolation Patient Management []  - 0 Hearing / Language / Visual special needs []  - 0 Assessment of Community assistance (transportation, D/C planning, etc.) []  - 0  [name], [name] ([id number]) [id number] Page 1 of 9 Visit Report for 07/26/2023 Arrival Information Details Patient Name: Date of Service: [name] [facility] J. 07/26/2023 12:45 PM Medical Record Number: [id number] Patient Account Number: [id number] Date of Birth/Sex: Treating RN: [birth year] (77 y.o. [name]) [name] Primary Care [name]: [name] Other Clinician: Betha [name] Referring [name]: Treating [name]/Extender: [name] in Treatment: 50 Visit Information History Since Last Visit All ordered tests and consults were completed: No Patient Arrived: [name] Added or deleted any medications: No Arrival Time: 12:59 Any new allergies or adverse reactions: No Transfer Assistance: None Had a fall or experienced change in No Patient Identification Verified: Yes activities of daily living that may affect Secondary Verification Process Completed: Yes risk of falls: Patient Requires Transmission-Based Precautions: No Signs or symptoms of abuse/neglect since last visito No Patient Has Alerts: Yes Hospitalized since last visit: No Patient Alerts: Patient on Blood Thinner Implantable device outside of the clinic excluding No Warfarin cellular tissue based products placed in the center Type II Diabetic since last visit: Has Dressing in Place as Prescribed: Yes Has Compression in Place as Prescribed: Yes Pain Present Now: No Electronic Signature(s) Signed: 07/26/2023 4:27:08 PM By: [name] Entered By: [name] on 07/26/2023 10:00:23 -------------------------------------------------------------------------------- Clinic Level of Care Assessment Details Patient Name: Date of Service: [name] [facility] J. 07/26/2023 12:45 PM Medical Record Number: [id number] Patient Account Number: [id number] Date of Birth/Sex: Treating RN: May 12, 1946 (77 y.o. [name] Primary Care [name]: [name] Other  Clinician: Betha [name] Referring [name]: Treating [name]/Extender: [name] in Treatment: 57 Clinic Level of Care Assessment Items TOOL 4 Quantity Score []  - 0 Use when only an EandM is performed on FOLLOW-UP visit ASSESSMENTS - Nursing Assessment / Reassessment X- 1 10 Reassessment of Co-morbidities (includes updates in patient status) [name] ([id number]) [phone].pdf Page 2 of 9 X- 1 5 Reassessment of Adherence to Treatment Plan ASSESSMENTS - Wound and Skin A ssessment / Reassessment X - Simple Wound Assessment / Reassessment - one wound 1 5 []  - 0 Complex Wound Assessment / Reassessment - multiple wounds []  - 0 Dermatologic / Skin Assessment (not related to wound area) ASSESSMENTS - Focused Assessment []  - 0 Circumferential Edema Measurements - multi extremities []  - 0 Nutritional Assessment / Counseling / Intervention []  - 0 Lower Extremity Assessment (monofilament, tuning fork, pulses) []  - 0 Peripheral Arterial Disease Assessment (using hand held doppler) ASSESSMENTS - Ostomy and/or Continence Assessment and Care []  - 0 Incontinence Assessment and Management []  - 0 Ostomy Care Assessment and Management (repouching, etc.) PROCESS - Coordination of Care X - Simple Patient / Family Education for ongoing care 1 15 []  - 0 Complex (extensive) Patient / Family Education for ongoing care []  - 0 Staff obtains Chiropractor, Records, T Results / Process Orders est []  - 0 Staff telephones HHA, Nursing Homes / Clarify orders / etc []  - 0 Routine Transfer to another Facility (non-emergent condition) []  - 0 Routine Hospital Admission (non-emergent condition) []  - 0 New Admissions / Manufacturing engineer / Ordering NPWT Apligraf, etc. , []  - 0 Emergency Hospital Admission (emergent condition) X- 1 10 Simple Discharge Coordination []  - 0 Complex (extensive) Discharge Coordination PROCESS - Special  Needs []  - 0 Pediatric / Minor Patient Management []  - 0 Isolation Patient Management []  - 0 Hearing / Language / Visual special needs []  - 0 Assessment of Community assistance (transportation, D/C planning, etc.) []  - 0

## 2023-07-28 DIAGNOSIS — E119 Type 2 diabetes mellitus without complications: Secondary | ICD-10-CM | POA: Diagnosis not present

## 2023-07-28 DIAGNOSIS — H35413 Lattice degeneration of retina, bilateral: Secondary | ICD-10-CM | POA: Diagnosis not present

## 2023-07-28 DIAGNOSIS — M3501 Sicca syndrome with keratoconjunctivitis: Secondary | ICD-10-CM | POA: Diagnosis not present

## 2023-07-28 DIAGNOSIS — M47896 Other spondylosis, lumbar region: Secondary | ICD-10-CM | POA: Diagnosis not present

## 2023-07-28 DIAGNOSIS — H04221 Epiphora due to insufficient drainage, right lacrimal gland: Secondary | ICD-10-CM | POA: Diagnosis not present

## 2023-07-28 DIAGNOSIS — Z0100 Encounter for examination of eyes and vision without abnormal findings: Secondary | ICD-10-CM | POA: Diagnosis not present

## 2023-07-28 DIAGNOSIS — G894 Chronic pain syndrome: Secondary | ICD-10-CM | POA: Diagnosis not present

## 2023-08-02 ENCOUNTER — Encounter: Payer: Medicare HMO | Admitting: Physician Assistant

## 2023-08-02 DIAGNOSIS — M1990 Unspecified osteoarthritis, unspecified site: Secondary | ICD-10-CM | POA: Diagnosis not present

## 2023-08-02 DIAGNOSIS — E1142 Type 2 diabetes mellitus with diabetic polyneuropathy: Secondary | ICD-10-CM | POA: Diagnosis not present

## 2023-08-02 DIAGNOSIS — Z952 Presence of prosthetic heart valve: Secondary | ICD-10-CM | POA: Diagnosis not present

## 2023-08-02 DIAGNOSIS — E11621 Type 2 diabetes mellitus with foot ulcer: Secondary | ICD-10-CM | POA: Diagnosis not present

## 2023-08-02 DIAGNOSIS — Z7901 Long term (current) use of anticoagulants: Secondary | ICD-10-CM | POA: Diagnosis not present

## 2023-08-02 DIAGNOSIS — L97422 Non-pressure chronic ulcer of left heel and midfoot with fat layer exposed: Secondary | ICD-10-CM | POA: Diagnosis not present

## 2023-08-02 DIAGNOSIS — I10 Essential (primary) hypertension: Secondary | ICD-10-CM | POA: Diagnosis not present

## 2023-08-02 DIAGNOSIS — G8929 Other chronic pain: Secondary | ICD-10-CM | POA: Diagnosis not present

## 2023-08-02 DIAGNOSIS — L97522 Non-pressure chronic ulcer of other part of left foot with fat layer exposed: Secondary | ICD-10-CM | POA: Diagnosis not present

## 2023-08-02 DIAGNOSIS — I480 Paroxysmal atrial fibrillation: Secondary | ICD-10-CM | POA: Diagnosis not present

## 2023-08-02 NOTE — Progress Notes (Addendum)
be secondary to the Tubigrip more than anything to be honest. Fortunately I do not see any signs of active infection at this time which is great news. No fevers, chills, nausea, vomiting, or diarrhea. 08-02-2023 upon evaluation today patient appears to be doing great in regard to his heel ulcer. He has been tolerating the dressing changes without complication. Fortunately I do not see any signs of active infection locally or systemically at this time. Objective Constitutional Well-nourished and well-hydrated in no acute distress. Vitals Time Taken: 2:05 PM, Height: 70 in, Weight: 265 lbs, BMI: 38, Temperature: 98.3 F, Pulse: 65 bpm, Respiratory Rate: 18 breaths/min, Blood Pressure: 134/57 mmHg. Respiratory normal breathing without difficulty. Psychiatric this patient is able to make decisions and demonstrates good insight into disease process. Alert and Oriented x 3. pleasant and cooperative. General Notes: Patient's wound bed actually showed signs of good granulation epithelization at this point. Fortunately I see no signs of worsening overall and I do believe that the patient is healed based on what I see. My suggestion is that we monitor just a little bit longer to ensure this maintains closure. 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 Bathing/ Shower/ Hygiene: [name] ([id number]) [id number] Page 12 of 12 May shower; gently cleanse wound with antibacterial soap, rinse and pat dry prior to dressing wounds Additional Orders / Instructions: Other: - apply Urea cream to heels at night, use heel cup for protection 1. I would recommend that we have the patient continue to monitor for any signs of infection or worsening. This includes the use of the urea cream to the heels at night which I think is doing very well. 2. Use an heel cup for protection. 3. Also can recommend that he continue to gently wash with antibacterial soap in the shower. We will see patient back for reevaluation in 3 weeks here in the clinic. If anything worsens or changes patient will contact our office for additional recommendations. This is confirming appointment to make sure that he is still closed and everything looks great. Electronic Signature(s) Signed: 08/06/2023 10:22:10 AM By: [name] Derry PA-C Entered By: [name] on 08/06/2023 07:22:10 -------------------------------------------------------------------------------- SuperBill Details Patient Name: Date of Service: [name], [name] [name] 08/02/2023 Medical Record Number: [id number] Patient Account Number: [id number] Date of Birth/Sex: Treating RN: 14-Dec-1945 (77 y.o. 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 Facility Procedures : CPT4 Code: 69629528 Description: 41324 - WOUND CARE VISIT-LEV 2 EST PT Modifier: Quantity:  1 Physician Procedures : CPT4 Code Description Modifier [id number] - WC PHYS LEVEL 3 - EST PT ICD-10 Diagnosis Description E11.621 Type 2 diabetes mellitus with foot ulcer L97.522 Non-pressure chronic ulcer of other part of left foot with fat layer exposed E11.42 Type 2  diabetes mellitus with diabetic polyneuropathy Quantity: 1 Electronic Signature(s) Signed: 08/02/2023 4:43:28 PM By: [name] Derry PA-C Entered By: [name] on 08/02/2023 13:43:28  [name], [name] ([id number]) [id number] Page 1 of 12 Visit Report for 08/02/2023 Chief Complaint Document Details Patient Name: Date of Service: [name], [name] [facility] 08/02/2023 1:45 PM Medical Record Number: [id number] Patient Account Number: [id number] Date of Birth/Sex: Treating RN: 1945-11-25 (77 y.o. [name]) [name] Primary Care Provider: Aram [name] Other Clinician: Betha [name] Referring Provider: Treating Provider/Extender: [name] in Treatment: 48 Information Obtained from: Patient Chief Complaint Left heel ulcer Electronic Signature(s) Signed: 08/02/2023 1:55:25 PM By: [name] Derry PA-C Entered By: [name] on 08/02/2023 10:55:25 -------------------------------------------------------------------------------- HPI Details Patient Name: Date of Service: [name], [name] [name] 08/02/2023 1:45 PM Medical Record Number: [id number] Patient Account Number: [id number] Date of Birth/Sex: Treating RN: 1945-11-16 (77 y.o. [name] Primary Care Provider: Aram [name] Other Clinician: Betha [name] Referring Provider: Treating Provider/Extender: [name] in Treatment: 80 History of Present Illness HPI Description: 09/24/2020 on evaluation today patient presents today for a heel ulcer that he tells me has been present for about 2 years. He has been seeing podiatry and they have been attempting to manage this including what sounds to be a total contact cast, Unna boot, and just standard dressings otherwise as well. Most recently has been using triple antibiotic ointment. With that being said unfortunately despite everything he really has not had any significant improvement. He tells me that he cannot even really remember exactly how this began but he presumed it may have rubbed on his shoes or something of that nature. 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 He tells me that this does seem to help. Fortunately there is no signs of active infection at this time. His most recent hemoglobin A1c was 8.1 though he knows this was this year he cannot tell me the exact time. His fluid pills currently to help with some of the lower extremity edema although he does obviously have signs of venous stasis/lymphedema. Currently there is no evidence of active infection. No fevers, chills, nausea, vomiting, or diarrhea. Patient has had fairly recent ABIs which were performed on 07/19/2020 and revealed that he has normal findings in both the ankle and toe locations bilaterally. His ABI on the right was 1.09 on the left was 1.08 with a TBI on the right of 0.88 and on the left of 0.94. Triphasic flow was noted throughout. 10/08/2020 on evaluation today patient appears to be doing pretty well in regard to his left heel currently in fact this is doing a great job and seems to be healing quite nicely. Unfortunately on his right leg he had a pile of wood that actually fell on him injuring his right leg this is somewhat erythematous has me concerned little bit about cellulitis though there is not really a good area to culture at this point. MESSI, SHENEFIELD ([id number]) [id number] Page 2 of 12 10/24/2020 upon evaluation today patient appears to be doing well with regard to his heel ulcer. He is showing signs of improvement which is great news. His right leg is completely healed. Overall I feel like he is doing excellent and there is no signs of infection. 11/07/2020 upon evaluation today patient appears to be doing well with regard to  [name], [name] ([id number]) [id number] Page 1 of 12 Visit Report for 08/02/2023 Chief Complaint Document Details Patient Name: Date of Service: [name], [name] [facility] 08/02/2023 1:45 PM Medical Record Number: [id number] Patient Account Number: [id number] Date of Birth/Sex: Treating RN: 1945-11-25 (77 y.o. [name]) [name] Primary Care Provider: Aram [name] Other Clinician: Betha [name] Referring Provider: Treating Provider/Extender: [name] in Treatment: 48 Information Obtained from: Patient Chief Complaint Left heel ulcer Electronic Signature(s) Signed: 08/02/2023 1:55:25 PM By: [name] Derry PA-C Entered By: [name] on 08/02/2023 10:55:25 -------------------------------------------------------------------------------- HPI Details Patient Name: Date of Service: [name], [name] [name] 08/02/2023 1:45 PM Medical Record Number: [id number] Patient Account Number: [id number] Date of Birth/Sex: Treating RN: 1945-11-16 (77 y.o. [name] Primary Care Provider: Aram [name] Other Clinician: Betha [name] Referring Provider: Treating Provider/Extender: [name] in Treatment: 80 History of Present Illness HPI Description: 09/24/2020 on evaluation today patient presents today for a heel ulcer that he tells me has been present for about 2 years. He has been seeing podiatry and they have been attempting to manage this including what sounds to be a total contact cast, Unna boot, and just standard dressings otherwise as well. Most recently has been using triple antibiotic ointment. With that being said unfortunately despite everything he really has not had any significant improvement. He tells me that he cannot even really remember exactly how this began but he presumed it may have rubbed on his shoes or something of that nature. 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 He tells me that this does seem to help. Fortunately there is no signs of active infection at this time. His most recent hemoglobin A1c was 8.1 though he knows this was this year he cannot tell me the exact time. His fluid pills currently to help with some of the lower extremity edema although he does obviously have signs of venous stasis/lymphedema. Currently there is no evidence of active infection. No fevers, chills, nausea, vomiting, or diarrhea. Patient has had fairly recent ABIs which were performed on 07/19/2020 and revealed that he has normal findings in both the ankle and toe locations bilaterally. His ABI on the right was 1.09 on the left was 1.08 with a TBI on the right of 0.88 and on the left of 0.94. Triphasic flow was noted throughout. 10/08/2020 on evaluation today patient appears to be doing pretty well in regard to his left heel currently in fact this is doing a great job and seems to be healing quite nicely. Unfortunately on his right leg he had a pile of wood that actually fell on him injuring his right leg this is somewhat erythematous has me concerned little bit about cellulitis though there is not really a good area to culture at this point. MESSI, SHENEFIELD ([id number]) [id number] Page 2 of 12 10/24/2020 upon evaluation today patient appears to be doing well with regard to his heel ulcer. He is showing signs of improvement which is great news. His right leg is completely healed. Overall I feel like he is doing excellent and there is no signs of infection. 11/07/2020 upon evaluation today patient appears to be doing well with regard to  this maintains closure. Electronic Signature(s) Signed: 08/06/2023 10:21:33 AM By: [name] Derry PA-C Entered By: [name] on 08/06/2023 07:21:33 -------------------------------------------------------------------------------- Physician Orders Details Patient Name: Date of Service: [name], [name] [name] 08/02/2023 1:45 PM Medical Record Number: [id number] Patient Account Number: [id number] Date of Birth/Sex: Treating RN: 27-Sep-1946 (77 y.o. 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 Bathing/ Shower/ Hygiene May shower; gently cleanse wound with antibacterial soap, rinse and pat dry prior to dressing wounds Additional Orders / Instructions Other: - apply Urea cream to heels at night, use heel cup for protection Electronic Signature(s) Signed: 08/02/2023 4:48:00 PM By: [name] Derry PA-C Signed: 08/03/2023 4:35:53 PM By: [name] Entered By: [name] on 08/02/2023 11:27:52 -------------------------------------------------------------------------------- Problem List Details Patient Name: Date of Service: [name], [name] [name] 08/02/2023 1:45 PM Medical Record Number: [id number] Patient Account Number: [id number] Date of Birth/Sex: Treating RN: April 08, 1946 (77 y.o. [name] Primary  Care Provider: Aram [name] Other Clinician: Betha [name] Referring Provider: Treating Provider/Extender: [name] in Treatment: [address] [place] ([id number]) [id number] Page 7 of 12 Active Problems ICD-10 Encounter Code Description Active Date MDM Diagnosis E11.621 Type 2 diabetes mellitus with foot ulcer 06/19/2022 No Yes L97.522 Non-pressure chronic ulcer of other part of left foot with fat layer exposed 06/19/2022 No Yes E11.42 Type 2 diabetes mellitus with diabetic polyneuropathy 06/19/2022 No Yes Inactive Problems Resolved Problems Electronic Signature(s) Signed: 08/02/2023 1:55:17 PM By: [name] Derry PA-C Entered By: [name] on 08/02/2023 10:55:17 -------------------------------------------------------------------------------- Progress Note Details Patient Name: Date of Service: [name], [name] [name] 08/02/2023 1:45 PM Medical Record Number: [id number] Patient Account Number: [id number] Date of Birth/Sex: Treating RN: 27-Sep-1946 (77 y.o. [name] Primary Care Provider: Aram [name] Other Clinician: Betha [name] Referring Provider: Treating Provider/Extender: [name] in Treatment: 58 Subjective Chief Complaint Information obtained from Patient Left heel ulcer History of Present Illness (HPI) 09/24/2020 on evaluation today patient presents today for a heel ulcer that he tells me has been present for about 2 years. He has been seeing podiatry and they have been attempting to manage this including what sounds to be a total contact cast, Unna boot, and just standard dressings otherwise as well. Most recently has been using triple antibiotic ointment. With that being said unfortunately despite everything he really has not had any significant improvement. He tells me that he cannot even really remember exactly how this began but he presumed it may have rubbed on his shoes or something  of that nature. 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 He tells me that this does seem to help. Fortunately there is no signs of active infection at this time. His most recent hemoglobin A1c was 8.1 though he knows this was this year he cannot  this maintains closure. Electronic Signature(s) Signed: 08/06/2023 10:21:33 AM By: [name] Derry PA-C Entered By: [name] on 08/06/2023 07:21:33 -------------------------------------------------------------------------------- Physician Orders Details Patient Name: Date of Service: [name], [name] [name] 08/02/2023 1:45 PM Medical Record Number: [id number] Patient Account Number: [id number] Date of Birth/Sex: Treating RN: 27-Sep-1946 (77 y.o. 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 Bathing/ Shower/ Hygiene May shower; gently cleanse wound with antibacterial soap, rinse and pat dry prior to dressing wounds Additional Orders / Instructions Other: - apply Urea cream to heels at night, use heel cup for protection Electronic Signature(s) Signed: 08/02/2023 4:48:00 PM By: [name] Derry PA-C Signed: 08/03/2023 4:35:53 PM By: [name] Entered By: [name] on 08/02/2023 11:27:52 -------------------------------------------------------------------------------- Problem List Details Patient Name: Date of Service: [name], [name] [name] 08/02/2023 1:45 PM Medical Record Number: [id number] Patient Account Number: [id number] Date of Birth/Sex: Treating RN: April 08, 1946 (77 y.o. [name] Primary  Care Provider: Aram [name] Other Clinician: Betha [name] Referring Provider: Treating Provider/Extender: [name] in Treatment: [address] [place] ([id number]) [id number] Page 7 of 12 Active Problems ICD-10 Encounter Code Description Active Date MDM Diagnosis E11.621 Type 2 diabetes mellitus with foot ulcer 06/19/2022 No Yes L97.522 Non-pressure chronic ulcer of other part of left foot with fat layer exposed 06/19/2022 No Yes E11.42 Type 2 diabetes mellitus with diabetic polyneuropathy 06/19/2022 No Yes Inactive Problems Resolved Problems Electronic Signature(s) Signed: 08/02/2023 1:55:17 PM By: [name] Derry PA-C Entered By: [name] on 08/02/2023 10:55:17 -------------------------------------------------------------------------------- Progress Note Details Patient Name: Date of Service: [name], [name] [name] 08/02/2023 1:45 PM Medical Record Number: [id number] Patient Account Number: [id number] Date of Birth/Sex: Treating RN: 27-Sep-1946 (77 y.o. [name] Primary Care Provider: Aram [name] Other Clinician: Betha [name] Referring Provider: Treating Provider/Extender: [name] in Treatment: 58 Subjective Chief Complaint Information obtained from Patient Left heel ulcer History of Present Illness (HPI) 09/24/2020 on evaluation today patient presents today for a heel ulcer that he tells me has been present for about 2 years. He has been seeing podiatry and they have been attempting to manage this including what sounds to be a total contact cast, Unna boot, and just standard dressings otherwise as well. Most recently has been using triple antibiotic ointment. With that being said unfortunately despite everything he really has not had any significant improvement. He tells me that he cannot even really remember exactly how this began but he presumed it may have rubbed on his shoes or something  of that nature. 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 He tells me that this does seem to help. Fortunately there is no signs of active infection at this time. His most recent hemoglobin A1c was 8.1 though he knows this was this year he cannot  [name], [name] ([id number]) [id number] Page 1 of 12 Visit Report for 08/02/2023 Chief Complaint Document Details Patient Name: Date of Service: [name], [name] [facility] 08/02/2023 1:45 PM Medical Record Number: [id number] Patient Account Number: [id number] Date of Birth/Sex: Treating RN: 1945-11-25 (77 y.o. [name]) [name] Primary Care Provider: Aram [name] Other Clinician: Betha [name] Referring Provider: Treating Provider/Extender: [name] in Treatment: 48 Information Obtained from: Patient Chief Complaint Left heel ulcer Electronic Signature(s) Signed: 08/02/2023 1:55:25 PM By: [name] Derry PA-C Entered By: [name] on 08/02/2023 10:55:25 -------------------------------------------------------------------------------- HPI Details Patient Name: Date of Service: [name], [name] [name] 08/02/2023 1:45 PM Medical Record Number: [id number] Patient Account Number: [id number] Date of Birth/Sex: Treating RN: 1945-11-16 (77 y.o. [name] Primary Care Provider: Aram [name] Other Clinician: Betha [name] Referring Provider: Treating Provider/Extender: [name] in Treatment: 80 History of Present Illness HPI Description: 09/24/2020 on evaluation today patient presents today for a heel ulcer that he tells me has been present for about 2 years. He has been seeing podiatry and they have been attempting to manage this including what sounds to be a total contact cast, Unna boot, and just standard dressings otherwise as well. Most recently has been using triple antibiotic ointment. With that being said unfortunately despite everything he really has not had any significant improvement. He tells me that he cannot even really remember exactly how this began but he presumed it may have rubbed on his shoes or something of that nature. 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 He tells me that this does seem to help. Fortunately there is no signs of active infection at this time. His most recent hemoglobin A1c was 8.1 though he knows this was this year he cannot tell me the exact time. His fluid pills currently to help with some of the lower extremity edema although he does obviously have signs of venous stasis/lymphedema. Currently there is no evidence of active infection. No fevers, chills, nausea, vomiting, or diarrhea. Patient has had fairly recent ABIs which were performed on 07/19/2020 and revealed that he has normal findings in both the ankle and toe locations bilaterally. His ABI on the right was 1.09 on the left was 1.08 with a TBI on the right of 0.88 and on the left of 0.94. Triphasic flow was noted throughout. 10/08/2020 on evaluation today patient appears to be doing pretty well in regard to his left heel currently in fact this is doing a great job and seems to be healing quite nicely. Unfortunately on his right leg he had a pile of wood that actually fell on him injuring his right leg this is somewhat erythematous has me concerned little bit about cellulitis though there is not really a good area to culture at this point. MESSI, SHENEFIELD ([id number]) [id number] Page 2 of 12 10/24/2020 upon evaluation today patient appears to be doing well with regard to his heel ulcer. He is showing signs of improvement which is great news. His right leg is completely healed. Overall I feel like he is doing excellent and there is no signs of infection. 11/07/2020 upon evaluation today patient appears to be doing well with regard to  be secondary to the Tubigrip more than anything to be honest. Fortunately I do not see any signs of active infection at this time which is great news. No fevers, chills, nausea, vomiting, or diarrhea. 08-02-2023 upon evaluation today patient appears to be doing great in regard to his heel ulcer. He has been tolerating the dressing changes without complication. Fortunately I do not see any signs of active infection locally or systemically at this time. Objective Constitutional Well-nourished and well-hydrated in no acute distress. Vitals Time Taken: 2:05 PM, Height: 70 in, Weight: 265 lbs, BMI: 38, Temperature: 98.3 F, Pulse: 65 bpm, Respiratory Rate: 18 breaths/min, Blood Pressure: 134/57 mmHg. Respiratory normal breathing without difficulty. Psychiatric this patient is able to make decisions and demonstrates good insight into disease process. Alert and Oriented x 3. pleasant and cooperative. General Notes: Patient's wound bed actually showed signs of good granulation epithelization at this point. Fortunately I see no signs of worsening overall and I do believe that the patient is healed based on what I see. My suggestion is that we monitor just a little bit longer to ensure this maintains closure. 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 Bathing/ Shower/ Hygiene: [name] ([id number]) [id number] Page 12 of 12 May shower; gently cleanse wound with antibacterial soap, rinse and pat dry prior to dressing wounds Additional Orders / Instructions: Other: - apply Urea cream to heels at night, use heel cup for protection 1. I would recommend that we have the patient continue to monitor for any signs of infection or worsening. This includes the use of the urea cream to the heels at night which I think is doing very well. 2. Use an heel cup for protection. 3. Also can recommend that he continue to gently wash with antibacterial soap in the shower. We will see patient back for reevaluation in 3 weeks here in the clinic. If anything worsens or changes patient will contact our office for additional recommendations. This is confirming appointment to make sure that he is still closed and everything looks great. Electronic Signature(s) Signed: 08/06/2023 10:22:10 AM By: [name] Derry PA-C Entered By: [name] on 08/06/2023 07:22:10 -------------------------------------------------------------------------------- SuperBill Details Patient Name: Date of Service: [name], [name] [name] 08/02/2023 Medical Record Number: [id number] Patient Account Number: [id number] Date of Birth/Sex: Treating RN: 14-Dec-1945 (77 y.o. 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 Facility Procedures : CPT4 Code: [id number] Description: 41324 - WOUND CARE VISIT-LEV 2 EST PT Modifier: Quantity:  1 Physician Procedures : CPT4 Code Description Modifier [id number] - WC PHYS LEVEL 3 - EST PT ICD-10 Diagnosis Description E11.621 Type 2 diabetes mellitus with foot ulcer L97.522 Non-pressure chronic ulcer of other part of left foot with fat layer exposed E11.42 Type 2  diabetes mellitus with diabetic polyneuropathy Quantity: 1 Electronic Signature(s) Signed: 08/02/2023 4:43:28 PM By: [name] Derry PA-C Entered By: [name] on 08/02/2023 13:43:28  this maintains closure. Electronic Signature(s) Signed: 08/06/2023 10:21:33 AM By: [name] Derry PA-C Entered By: [name] on 08/06/2023 07:21:33 -------------------------------------------------------------------------------- Physician Orders Details Patient Name: Date of Service: [name], [name] [name] 08/02/2023 1:45 PM Medical Record Number: [id number] Patient Account Number: [id number] Date of Birth/Sex: Treating RN: 27-Sep-1946 (77 y.o. 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 Bathing/ Shower/ Hygiene May shower; gently cleanse wound with antibacterial soap, rinse and pat dry prior to dressing wounds Additional Orders / Instructions Other: - apply Urea cream to heels at night, use heel cup for protection Electronic Signature(s) Signed: 08/02/2023 4:48:00 PM By: [name] Derry PA-C Signed: 08/03/2023 4:35:53 PM By: [name] Entered By: [name] on 08/02/2023 11:27:52 -------------------------------------------------------------------------------- Problem List Details Patient Name: Date of Service: [name], [name] [name] 08/02/2023 1:45 PM Medical Record Number: [id number] Patient Account Number: [id number] Date of Birth/Sex: Treating RN: April 08, 1946 (77 y.o. [name] Primary  Care Provider: Aram [name] Other Clinician: Betha [name] Referring Provider: Treating Provider/Extender: [name] in Treatment: [address] [place] ([id number]) [id number] Page 7 of 12 Active Problems ICD-10 Encounter Code Description Active Date MDM Diagnosis E11.621 Type 2 diabetes mellitus with foot ulcer 06/19/2022 No Yes L97.522 Non-pressure chronic ulcer of other part of left foot with fat layer exposed 06/19/2022 No Yes E11.42 Type 2 diabetes mellitus with diabetic polyneuropathy 06/19/2022 No Yes Inactive Problems Resolved Problems Electronic Signature(s) Signed: 08/02/2023 1:55:17 PM By: [name] Derry PA-C Entered By: [name] on 08/02/2023 10:55:17 -------------------------------------------------------------------------------- Progress Note Details Patient Name: Date of Service: [name], [name] [name] 08/02/2023 1:45 PM Medical Record Number: [id number] Patient Account Number: [id number] Date of Birth/Sex: Treating RN: 27-Sep-1946 (77 y.o. [name] Primary Care Provider: Aram [name] Other Clinician: Betha [name] Referring Provider: Treating Provider/Extender: [name] in Treatment: 58 Subjective Chief Complaint Information obtained from Patient Left heel ulcer History of Present Illness (HPI) 09/24/2020 on evaluation today patient presents today for a heel ulcer that he tells me has been present for about 2 years. He has been seeing podiatry and they have been attempting to manage this including what sounds to be a total contact cast, Unna boot, and just standard dressings otherwise as well. Most recently has been using triple antibiotic ointment. With that being said unfortunately despite everything he really has not had any significant improvement. He tells me that he cannot even really remember exactly how this began but he presumed it may have rubbed on his shoes or something  of that nature. 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 He tells me that this does seem to help. Fortunately there is no signs of active infection at this time. His most recent hemoglobin A1c was 8.1 though he knows this was this year he cannot  diabetic. We have been using Hydrofera Blue under Urgo K2 lite wrap he has been in his surgical shoe although he tells me it disintegrated and he came in on a regular running shoe today. 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 He is not wearing compression stocking 06-22-2023 upon evaluation today patient appears to be doing excellent in regard to his wound on the heel which is actually significantly smaller compared to last week's evaluation. Fortunately I do not see any signs of active infection locally or systemically at this time. 06-28-2023 upon evaluation today patient appears to be doing well currently in regard to his heel which is actually showing signs of improvement. Fortunately I do not see any evidence of active infection at this time which is great news and in general I do believe that we are making headway towards complete closure which is good news. No fevers, chills, nausea, vomiting, or diarrhea. 07-05-2023 upon evaluation today patient appears to be doing well currently in regard to his heel ulcer. This is actually very close I think that being completely healed. This is great news and overall very pleased with where we stand today. 07-12-2023 upon evaluation today patient appears to be doing well currently in regard to his wound. He in fact appears to be very close to complete resolution I am very pleased that regard. However on the tip of his foot there appears to be some issue here with inflammation secondary to infection. I am very concerned about the infection at the site. 07-19-2023 upon evaluation today patient appears to actually be doing well in  regard to his heel. Fortunately I do not see any signs of worsening overall. In general I think you are making headway towards closure. 07-26-2023 upon evaluation today patient appears to be doing well currently in regard to his wound. He has in fact completely healed and looks to be doing great. I still want to keep an eye on things due to the end of his foot being red although I think this may be secondary to the Tubigrip more than anything to be honest. Fortunately I do not see any signs of active infection at this time which is great news. No fevers, chills, nausea, vomiting, or diarrhea. 08-02-2023 upon evaluation today patient appears to be doing great in regard to his heel ulcer. He has been tolerating the dressing changes without complication. Fortunately I do not see any signs of active infection locally or systemically at this time. Electronic Signature(s) Signed: 08/06/2023 10:21:16 AM By: [name] Derry PA-C Entered By: [name] on 08/06/2023 07:21:16 -------------------------------------------------------------------------------- Physical Exam Details Patient Name: Date of Service: [name], [name] [name] 08/02/2023 1:45 PM Medical Record Number: [id number] Patient Account Number: [id number] Date of Birth/Sex: Treating RN: 16-Jan-1946 (77 y.o. [name] Primary Care Provider: Aram [name] Other Clinician: Betha [name] Referring Provider: Treating Provider/Extender: Hermine Messick Weeks in Treatment: 67 Constitutional Well-nourished and well-hydrated in no acute distress. Respiratory normal breathing without difficulty. Psychiatric this patient is able to make decisions and demonstrates good insight into disease process. Alert and Oriented x 3. pleasant and cooperative. Notes Patient's wound bed actually showed signs of good granulation epithelization at this point. Fortunately I see no signs of worsening overall and I do believe that [name], [name]  ([id number]) [id number] Page 6 of 12 the patient is healed based on what I see. My suggestion is that we monitor just a little bit longer to ensure  this maintains closure. Electronic Signature(s) Signed: 08/06/2023 10:21:33 AM By: [name] Derry PA-C Entered By: [name] on 08/06/2023 07:21:33 -------------------------------------------------------------------------------- Physician Orders Details Patient Name: Date of Service: [name], [name] [name] 08/02/2023 1:45 PM Medical Record Number: [id number] Patient Account Number: [id number] Date of Birth/Sex: Treating RN: 27-Sep-1946 (77 y.o. 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 Bathing/ Shower/ Hygiene May shower; gently cleanse wound with antibacterial soap, rinse and pat dry prior to dressing wounds Additional Orders / Instructions Other: - apply Urea cream to heels at night, use heel cup for protection Electronic Signature(s) Signed: 08/02/2023 4:48:00 PM By: [name] Derry PA-C Signed: 08/03/2023 4:35:53 PM By: [name] Entered By: [name] on 08/02/2023 11:27:52 -------------------------------------------------------------------------------- Problem List Details Patient Name: Date of Service: [name], [name] [name] 08/02/2023 1:45 PM Medical Record Number: [id number] Patient Account Number: [id number] Date of Birth/Sex: Treating RN: April 08, 1946 (77 y.o. [name] Primary  Care Provider: Aram [name] Other Clinician: Betha [name] Referring Provider: Treating Provider/Extender: [name] in Treatment: [address] [place] ([id number]) [id number] Page 7 of 12 Active Problems ICD-10 Encounter Code Description Active Date MDM Diagnosis E11.621 Type 2 diabetes mellitus with foot ulcer 06/19/2022 No Yes L97.522 Non-pressure chronic ulcer of other part of left foot with fat layer exposed 06/19/2022 No Yes E11.42 Type 2 diabetes mellitus with diabetic polyneuropathy 06/19/2022 No Yes Inactive Problems Resolved Problems Electronic Signature(s) Signed: 08/02/2023 1:55:17 PM By: [name] Derry PA-C Entered By: [name] on 08/02/2023 10:55:17 -------------------------------------------------------------------------------- Progress Note Details Patient Name: Date of Service: [name], [name] [name] 08/02/2023 1:45 PM Medical Record Number: [id number] Patient Account Number: [id number] Date of Birth/Sex: Treating RN: 27-Sep-1946 (77 y.o. [name] Primary Care Provider: Aram [name] Other Clinician: Betha [name] Referring Provider: Treating Provider/Extender: [name] in Treatment: 58 Subjective Chief Complaint Information obtained from Patient Left heel ulcer History of Present Illness (HPI) 09/24/2020 on evaluation today patient presents today for a heel ulcer that he tells me has been present for about 2 years. He has been seeing podiatry and they have been attempting to manage this including what sounds to be a total contact cast, Unna boot, and just standard dressings otherwise as well. Most recently has been using triple antibiotic ointment. With that being said unfortunately despite everything he really has not had any significant improvement. He tells me that he cannot even really remember exactly how this began but he presumed it may have rubbed on his shoes or something  of that nature. 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 He tells me that this does seem to help. Fortunately there is no signs of active infection at this time. His most recent hemoglobin A1c was 8.1 though he knows this was this year he cannot  diabetic. We have been using Hydrofera Blue under Urgo K2 lite wrap he has been in his surgical shoe although he tells me it disintegrated and he came in on a regular running shoe today. 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 He is not wearing compression stocking 06-22-2023 upon evaluation today patient appears to be doing excellent in regard to his wound on the heel which is actually significantly smaller compared to last week's evaluation. Fortunately I do not see any signs of active infection locally or systemically at this time. 06-28-2023 upon evaluation today patient appears to be doing well currently in regard to his heel which is actually showing signs of improvement. Fortunately I do not see any evidence of active infection at this time which is great news and in general I do believe that we are making headway towards complete closure which is good news. No fevers, chills, nausea, vomiting, or diarrhea. 07-05-2023 upon evaluation today patient appears to be doing well currently in regard to his heel ulcer. This is actually very close I think that being completely healed. This is great news and overall very pleased with where we stand today. 07-12-2023 upon evaluation today patient appears to be doing well currently in regard to his wound. He in fact appears to be very close to complete resolution I am very pleased that regard. However on the tip of his foot there appears to be some issue here with inflammation secondary to infection. I am very concerned about the infection at the site. 07-19-2023 upon evaluation today patient appears to actually be doing well in  regard to his heel. Fortunately I do not see any signs of worsening overall. In general I think you are making headway towards closure. 07-26-2023 upon evaluation today patient appears to be doing well currently in regard to his wound. He has in fact completely healed and looks to be doing great. I still want to keep an eye on things due to the end of his foot being red although I think this may be secondary to the Tubigrip more than anything to be honest. Fortunately I do not see any signs of active infection at this time which is great news. No fevers, chills, nausea, vomiting, or diarrhea. 08-02-2023 upon evaluation today patient appears to be doing great in regard to his heel ulcer. He has been tolerating the dressing changes without complication. Fortunately I do not see any signs of active infection locally or systemically at this time. Electronic Signature(s) Signed: 08/06/2023 10:21:16 AM By: [name] Derry PA-C Entered By: [name] on 08/06/2023 07:21:16 -------------------------------------------------------------------------------- Physical Exam Details Patient Name: Date of Service: [name], [name] [name] 08/02/2023 1:45 PM Medical Record Number: [id number] Patient Account Number: [id number] Date of Birth/Sex: Treating RN: 16-Jan-1946 (77 y.o. [name] Primary Care Provider: Aram [name] Other Clinician: Betha [name] Referring Provider: Treating Provider/Extender: Hermine Messick Weeks in Treatment: 67 Constitutional Well-nourished and well-hydrated in no acute distress. Respiratory normal breathing without difficulty. Psychiatric this patient is able to make decisions and demonstrates good insight into disease process. Alert and Oriented x 3. pleasant and cooperative. Notes Patient's wound bed actually showed signs of good granulation epithelization at this point. Fortunately I see no signs of worsening overall and I do believe that [name], [name]  ([id number]) [id number] Page 6 of 12 the patient is healed based on what I see. My suggestion is that we monitor just a little bit longer to ensure  diabetic. We have been using Hydrofera Blue under Urgo K2 lite wrap he has been in his surgical shoe although he tells me it disintegrated and he came in on a regular running shoe today. 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 He is not wearing compression stocking 06-22-2023 upon evaluation today patient appears to be doing excellent in regard to his wound on the heel which is actually significantly smaller compared to last week's evaluation. Fortunately I do not see any signs of active infection locally or systemically at this time. 06-28-2023 upon evaluation today patient appears to be doing well currently in regard to his heel which is actually showing signs of improvement. Fortunately I do not see any evidence of active infection at this time which is great news and in general I do believe that we are making headway towards complete closure which is good news. No fevers, chills, nausea, vomiting, or diarrhea. 07-05-2023 upon evaluation today patient appears to be doing well currently in regard to his heel ulcer. This is actually very close I think that being completely healed. This is great news and overall very pleased with where we stand today. 07-12-2023 upon evaluation today patient appears to be doing well currently in regard to his wound. He in fact appears to be very close to complete resolution I am very pleased that regard. However on the tip of his foot there appears to be some issue here with inflammation secondary to infection. I am very concerned about the infection at the site. 07-19-2023 upon evaluation today patient appears to actually be doing well in  regard to his heel. Fortunately I do not see any signs of worsening overall. In general I think you are making headway towards closure. 07-26-2023 upon evaluation today patient appears to be doing well currently in regard to his wound. He has in fact completely healed and looks to be doing great. I still want to keep an eye on things due to the end of his foot being red although I think this may be secondary to the Tubigrip more than anything to be honest. Fortunately I do not see any signs of active infection at this time which is great news. No fevers, chills, nausea, vomiting, or diarrhea. 08-02-2023 upon evaluation today patient appears to be doing great in regard to his heel ulcer. He has been tolerating the dressing changes without complication. Fortunately I do not see any signs of active infection locally or systemically at this time. Electronic Signature(s) Signed: 08/06/2023 10:21:16 AM By: [name] Derry PA-C Entered By: [name] on 08/06/2023 07:21:16 -------------------------------------------------------------------------------- Physical Exam Details Patient Name: Date of Service: [name], [name] [name] 08/02/2023 1:45 PM Medical Record Number: [id number] Patient Account Number: [id number] Date of Birth/Sex: Treating RN: 16-Jan-1946 (77 y.o. [name] Primary Care Provider: Aram [name] Other Clinician: Betha [name] Referring Provider: Treating Provider/Extender: Hermine Messick Weeks in Treatment: 67 Constitutional Well-nourished and well-hydrated in no acute distress. Respiratory normal breathing without difficulty. Psychiatric this patient is able to make decisions and demonstrates good insight into disease process. Alert and Oriented x 3. pleasant and cooperative. Notes Patient's wound bed actually showed signs of good granulation epithelization at this point. Fortunately I see no signs of worsening overall and I do believe that [name], [name]  ([id number]) [id number] Page 6 of 12 the patient is healed based on what I see. My suggestion is that we monitor just a little bit longer to ensure  this maintains closure. Electronic Signature(s) Signed: 08/06/2023 10:21:33 AM By: [name] Derry PA-C Entered By: [name] on 08/06/2023 07:21:33 -------------------------------------------------------------------------------- Physician Orders Details Patient Name: Date of Service: [name], [name] [name] 08/02/2023 1:45 PM Medical Record Number: [id number] Patient Account Number: [id number] Date of Birth/Sex: Treating RN: 27-Sep-1946 (77 y.o. 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 Bathing/ Shower/ Hygiene May shower; gently cleanse wound with antibacterial soap, rinse and pat dry prior to dressing wounds Additional Orders / Instructions Other: - apply Urea cream to heels at night, use heel cup for protection Electronic Signature(s) Signed: 08/02/2023 4:48:00 PM By: [name] Derry PA-C Signed: 08/03/2023 4:35:53 PM By: [name] Entered By: [name] on 08/02/2023 11:27:52 -------------------------------------------------------------------------------- Problem List Details Patient Name: Date of Service: [name], [name] [name] 08/02/2023 1:45 PM Medical Record Number: [id number] Patient Account Number: [id number] Date of Birth/Sex: Treating RN: April 08, 1946 (77 y.o. [name] Primary  Care Provider: Aram [name] Other Clinician: Betha [name] Referring Provider: Treating Provider/Extender: [name] in Treatment: [address] [place] ([id number]) [id number] Page 7 of 12 Active Problems ICD-10 Encounter Code Description Active Date MDM Diagnosis E11.621 Type 2 diabetes mellitus with foot ulcer 06/19/2022 No Yes L97.522 Non-pressure chronic ulcer of other part of left foot with fat layer exposed 06/19/2022 No Yes E11.42 Type 2 diabetes mellitus with diabetic polyneuropathy 06/19/2022 No Yes Inactive Problems Resolved Problems Electronic Signature(s) Signed: 08/02/2023 1:55:17 PM By: [name] Derry PA-C Entered By: [name] on 08/02/2023 10:55:17 -------------------------------------------------------------------------------- Progress Note Details Patient Name: Date of Service: [name], [name] [name] 08/02/2023 1:45 PM Medical Record Number: [id number] Patient Account Number: [id number] Date of Birth/Sex: Treating RN: 27-Sep-1946 (77 y.o. [name] Primary Care Provider: Aram [name] Other Clinician: Betha [name] Referring Provider: Treating Provider/Extender: [name] in Treatment: 58 Subjective Chief Complaint Information obtained from Patient Left heel ulcer History of Present Illness (HPI) 09/24/2020 on evaluation today patient presents today for a heel ulcer that he tells me has been present for about 2 years. He has been seeing podiatry and they have been attempting to manage this including what sounds to be a total contact cast, Unna boot, and just standard dressings otherwise as well. Most recently has been using triple antibiotic ointment. With that being said unfortunately despite everything he really has not had any significant improvement. He tells me that he cannot even really remember exactly how this began but he presumed it may have rubbed on his shoes or something  of that nature. 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 He tells me that this does seem to help. Fortunately there is no signs of active infection at this time. His most recent hemoglobin A1c was 8.1 though he knows this was this year he cannot  this maintains closure. Electronic Signature(s) Signed: 08/06/2023 10:21:33 AM By: [name] Derry PA-C Entered By: [name] on 08/06/2023 07:21:33 -------------------------------------------------------------------------------- Physician Orders Details Patient Name: Date of Service: [name], [name] [name] 08/02/2023 1:45 PM Medical Record Number: [id number] Patient Account Number: [id number] Date of Birth/Sex: Treating RN: 27-Sep-1946 (77 y.o. 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 Bathing/ Shower/ Hygiene May shower; gently cleanse wound with antibacterial soap, rinse and pat dry prior to dressing wounds Additional Orders / Instructions Other: - apply Urea cream to heels at night, use heel cup for protection Electronic Signature(s) Signed: 08/02/2023 4:48:00 PM By: [name] Derry PA-C Signed: 08/03/2023 4:35:53 PM By: [name] Entered By: [name] on 08/02/2023 11:27:52 -------------------------------------------------------------------------------- Problem List Details Patient Name: Date of Service: [name], [name] [name] 08/02/2023 1:45 PM Medical Record Number: [id number] Patient Account Number: [id number] Date of Birth/Sex: Treating RN: April 08, 1946 (77 y.o. [name] Primary  Care Provider: Aram [name] Other Clinician: Betha [name] Referring Provider: Treating Provider/Extender: [name] in Treatment: [address] [place] ([id number]) [id number] Page 7 of 12 Active Problems ICD-10 Encounter Code Description Active Date MDM Diagnosis E11.621 Type 2 diabetes mellitus with foot ulcer 06/19/2022 No Yes L97.522 Non-pressure chronic ulcer of other part of left foot with fat layer exposed 06/19/2022 No Yes E11.42 Type 2 diabetes mellitus with diabetic polyneuropathy 06/19/2022 No Yes Inactive Problems Resolved Problems Electronic Signature(s) Signed: 08/02/2023 1:55:17 PM By: [name] Derry PA-C Entered By: [name] on 08/02/2023 10:55:17 -------------------------------------------------------------------------------- Progress Note Details Patient Name: Date of Service: [name], [name] [name] 08/02/2023 1:45 PM Medical Record Number: [id number] Patient Account Number: [id number] Date of Birth/Sex: Treating RN: 27-Sep-1946 (77 y.o. [name] Primary Care Provider: Aram [name] Other Clinician: Betha [name] Referring Provider: Treating Provider/Extender: [name] in Treatment: 58 Subjective Chief Complaint Information obtained from Patient Left heel ulcer History of Present Illness (HPI) 09/24/2020 on evaluation today patient presents today for a heel ulcer that he tells me has been present for about 2 years. He has been seeing podiatry and they have been attempting to manage this including what sounds to be a total contact cast, Unna boot, and just standard dressings otherwise as well. Most recently has been using triple antibiotic ointment. With that being said unfortunately despite everything he really has not had any significant improvement. He tells me that he cannot even really remember exactly how this began but he presumed it may have rubbed on his shoes or something  of that nature. 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 He tells me that this does seem to help. Fortunately there is no signs of active infection at this time. His most recent hemoglobin A1c was 8.1 though he knows this was this year he cannot  this maintains closure. Electronic Signature(s) Signed: 08/06/2023 10:21:33 AM By: [name] Derry PA-C Entered By: [name] on 08/06/2023 07:21:33 -------------------------------------------------------------------------------- Physician Orders Details Patient Name: Date of Service: [name], [name] [name] 08/02/2023 1:45 PM Medical Record Number: [id number] Patient Account Number: [id number] Date of Birth/Sex: Treating RN: 27-Sep-1946 (77 y.o. 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 Bathing/ Shower/ Hygiene May shower; gently cleanse wound with antibacterial soap, rinse and pat dry prior to dressing wounds Additional Orders / Instructions Other: - apply Urea cream to heels at night, use heel cup for protection Electronic Signature(s) Signed: 08/02/2023 4:48:00 PM By: [name] Derry PA-C Signed: 08/03/2023 4:35:53 PM By: [name] Entered By: [name] on 08/02/2023 11:27:52 -------------------------------------------------------------------------------- Problem List Details Patient Name: Date of Service: [name], [name] [name] 08/02/2023 1:45 PM Medical Record Number: [id number] Patient Account Number: [id number] Date of Birth/Sex: Treating RN: April 08, 1946 (77 y.o. [name] Primary  Care Provider: Aram [name] Other Clinician: Betha [name] Referring Provider: Treating Provider/Extender: [name] in Treatment: [address] [place] ([id number]) [id number] Page 7 of 12 Active Problems ICD-10 Encounter Code Description Active Date MDM Diagnosis E11.621 Type 2 diabetes mellitus with foot ulcer 06/19/2022 No Yes L97.522 Non-pressure chronic ulcer of other part of left foot with fat layer exposed 06/19/2022 No Yes E11.42 Type 2 diabetes mellitus with diabetic polyneuropathy 06/19/2022 No Yes Inactive Problems Resolved Problems Electronic Signature(s) Signed: 08/02/2023 1:55:17 PM By: [name] Derry PA-C Entered By: [name] on 08/02/2023 10:55:17 -------------------------------------------------------------------------------- Progress Note Details Patient Name: Date of Service: [name], [name] [name] 08/02/2023 1:45 PM Medical Record Number: [id number] Patient Account Number: [id number] Date of Birth/Sex: Treating RN: 27-Sep-1946 (77 y.o. [name] Primary Care Provider: Aram [name] Other Clinician: Betha [name] Referring Provider: Treating Provider/Extender: [name] in Treatment: 58 Subjective Chief Complaint Information obtained from Patient Left heel ulcer History of Present Illness (HPI) 09/24/2020 on evaluation today patient presents today for a heel ulcer that he tells me has been present for about 2 years. He has been seeing podiatry and they have been attempting to manage this including what sounds to be a total contact cast, Unna boot, and just standard dressings otherwise as well. Most recently has been using triple antibiotic ointment. With that being said unfortunately despite everything he really has not had any significant improvement. He tells me that he cannot even really remember exactly how this began but he presumed it may have rubbed on his shoes or something  of that nature. 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 He tells me that this does seem to help. Fortunately there is no signs of active infection at this time. His most recent hemoglobin A1c was 8.1 though he knows this was this year he cannot  this maintains closure. Electronic Signature(s) Signed: 08/06/2023 10:21:33 AM By: [name] Derry PA-C Entered By: [name] on 08/06/2023 07:21:33 -------------------------------------------------------------------------------- Physician Orders Details Patient Name: Date of Service: [name], [name] [name] 08/02/2023 1:45 PM Medical Record Number: [id number] Patient Account Number: [id number] Date of Birth/Sex: Treating RN: 27-Sep-1946 (77 y.o. 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 Bathing/ Shower/ Hygiene May shower; gently cleanse wound with antibacterial soap, rinse and pat dry prior to dressing wounds Additional Orders / Instructions Other: - apply Urea cream to heels at night, use heel cup for protection Electronic Signature(s) Signed: 08/02/2023 4:48:00 PM By: [name] Derry PA-C Signed: 08/03/2023 4:35:53 PM By: [name] Entered By: [name] on 08/02/2023 11:27:52 -------------------------------------------------------------------------------- Problem List Details Patient Name: Date of Service: [name], [name] [name] 08/02/2023 1:45 PM Medical Record Number: [id number] Patient Account Number: [id number] Date of Birth/Sex: Treating RN: April 08, 1946 (77 y.o. [name] Primary  Care Provider: Aram [name] Other Clinician: Betha [name] Referring Provider: Treating Provider/Extender: [name] in Treatment: [address] [place] ([id number]) [id number] Page 7 of 12 Active Problems ICD-10 Encounter Code Description Active Date MDM Diagnosis E11.621 Type 2 diabetes mellitus with foot ulcer 06/19/2022 No Yes L97.522 Non-pressure chronic ulcer of other part of left foot with fat layer exposed 06/19/2022 No Yes E11.42 Type 2 diabetes mellitus with diabetic polyneuropathy 06/19/2022 No Yes Inactive Problems Resolved Problems Electronic Signature(s) Signed: 08/02/2023 1:55:17 PM By: [name] Derry PA-C Entered By: [name] on 08/02/2023 10:55:17 -------------------------------------------------------------------------------- Progress Note Details Patient Name: Date of Service: [name], [name] [name] 08/02/2023 1:45 PM Medical Record Number: [id number] Patient Account Number: [id number] Date of Birth/Sex: Treating RN: 27-Sep-1946 (77 y.o. [name] Primary Care Provider: Aram [name] Other Clinician: Betha [name] Referring Provider: Treating Provider/Extender: [name] in Treatment: 58 Subjective Chief Complaint Information obtained from Patient Left heel ulcer History of Present Illness (HPI) 09/24/2020 on evaluation today patient presents today for a heel ulcer that he tells me has been present for about 2 years. He has been seeing podiatry and they have been attempting to manage this including what sounds to be a total contact cast, Unna boot, and just standard dressings otherwise as well. Most recently has been using triple antibiotic ointment. With that being said unfortunately despite everything he really has not had any significant improvement. He tells me that he cannot even really remember exactly how this began but he presumed it may have rubbed on his shoes or something  of that nature. 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 He tells me that this does seem to help. Fortunately there is no signs of active infection at this time. His most recent hemoglobin A1c was 8.1 though he knows this was this year he cannot  be secondary to the Tubigrip more than anything to be honest. Fortunately I do not see any signs of active infection at this time which is great news. No fevers, chills, nausea, vomiting, or diarrhea. 08-02-2023 upon evaluation today patient appears to be doing great in regard to his heel ulcer. He has been tolerating the dressing changes without complication. Fortunately I do not see any signs of active infection locally or systemically at this time. Objective Constitutional Well-nourished and well-hydrated in no acute distress. Vitals Time Taken: 2:05 PM, Height: 70 in, Weight: 265 lbs, BMI: 38, Temperature: 98.3 F, Pulse: 65 bpm, Respiratory Rate: 18 breaths/min, Blood Pressure: 134/57 mmHg. Respiratory normal breathing without difficulty. Psychiatric this patient is able to make decisions and demonstrates good insight into disease process. Alert and Oriented x 3. pleasant and cooperative. General Notes: Patient's wound bed actually showed signs of good granulation epithelization at this point. Fortunately I see no signs of worsening overall and I do believe that the patient is healed based on what I see. My suggestion is that we monitor just a little bit longer to ensure this maintains closure. 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 Bathing/ Shower/ Hygiene: [name] ([id number]) [id number] Page 12 of 12 May shower; gently cleanse wound with antibacterial soap, rinse and pat dry prior to dressing wounds Additional Orders / Instructions: Other: - apply Urea cream to heels at night, use heel cup for protection 1. I would recommend that we have the patient continue to monitor for any signs of infection or worsening. This includes the use of the urea cream to the heels at night which I think is doing very well. 2. Use an heel cup for protection. 3. Also can recommend that he continue to gently wash with antibacterial soap in the shower. We will see patient back for reevaluation in 3 weeks here in the clinic. If anything worsens or changes patient will contact our office for additional recommendations. This is confirming appointment to make sure that he is still closed and everything looks great. Electronic Signature(s) Signed: 08/06/2023 10:22:10 AM By: [name] Derry PA-C Entered By: [name] on 08/06/2023 07:22:10 -------------------------------------------------------------------------------- SuperBill Details Patient Name: Date of Service: [name], [name] [name] 08/02/2023 Medical Record Number: [id number] Patient Account Number: [id number] Date of Birth/Sex: Treating RN: 14-Dec-1945 (77 y.o. 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 Facility Procedures : CPT4 Code: [id number] Description: 41324 - WOUND CARE VISIT-LEV 2 EST PT Modifier: Quantity:  1 Physician Procedures : CPT4 Code Description Modifier [id number] - WC PHYS LEVEL 3 - EST PT ICD-10 Diagnosis Description E11.621 Type 2 diabetes mellitus with foot ulcer L97.522 Non-pressure chronic ulcer of other part of left foot with fat layer exposed E11.42 Type 2  diabetes mellitus with diabetic polyneuropathy Quantity: 1 Electronic Signature(s) Signed: 08/02/2023 4:43:28 PM By: [name] Derry PA-C Entered By: [name] on 08/02/2023 13:43:28  [name], [name] ([id number]) [id number] Page 1 of 12 Visit Report for 08/02/2023 Chief Complaint Document Details Patient Name: Date of Service: [name], [name] [facility] 08/02/2023 1:45 PM Medical Record Number: [id number] Patient Account Number: [id number] Date of Birth/Sex: Treating RN: 1945-11-25 (77 y.o. [name]) [name] Primary Care Provider: Aram [name] Other Clinician: Betha [name] Referring Provider: Treating Provider/Extender: [name] in Treatment: 48 Information Obtained from: Patient Chief Complaint Left heel ulcer Electronic Signature(s) Signed: 08/02/2023 1:55:25 PM By: [name] Derry PA-C Entered By: [name] on 08/02/2023 10:55:25 -------------------------------------------------------------------------------- HPI Details Patient Name: Date of Service: [name], [name] [name] 08/02/2023 1:45 PM Medical Record Number: [id number] Patient Account Number: [id number] Date of Birth/Sex: Treating RN: 1945-11-16 (77 y.o. [name] Primary Care Provider: Aram [name] Other Clinician: Betha [name] Referring Provider: Treating Provider/Extender: [name] in Treatment: 80 History of Present Illness HPI Description: 09/24/2020 on evaluation today patient presents today for a heel ulcer that he tells me has been present for about 2 years. He has been seeing podiatry and they have been attempting to manage this including what sounds to be a total contact cast, Unna boot, and just standard dressings otherwise as well. Most recently has been using triple antibiotic ointment. With that being said unfortunately despite everything he really has not had any significant improvement. He tells me that he cannot even really remember exactly how this began but he presumed it may have rubbed on his shoes or something of that nature. 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 He tells me that this does seem to help. Fortunately there is no signs of active infection at this time. His most recent hemoglobin A1c was 8.1 though he knows this was this year he cannot tell me the exact time. His fluid pills currently to help with some of the lower extremity edema although he does obviously have signs of venous stasis/lymphedema. Currently there is no evidence of active infection. No fevers, chills, nausea, vomiting, or diarrhea. Patient has had fairly recent ABIs which were performed on 07/19/2020 and revealed that he has normal findings in both the ankle and toe locations bilaterally. His ABI on the right was 1.09 on the left was 1.08 with a TBI on the right of 0.88 and on the left of 0.94. Triphasic flow was noted throughout. 10/08/2020 on evaluation today patient appears to be doing pretty well in regard to his left heel currently in fact this is doing a great job and seems to be healing quite nicely. Unfortunately on his right leg he had a pile of wood that actually fell on him injuring his right leg this is somewhat erythematous has me concerned little bit about cellulitis though there is not really a good area to culture at this point. MESSI, SHENEFIELD ([id number]) [id number] Page 2 of 12 10/24/2020 upon evaluation today patient appears to be doing well with regard to his heel ulcer. He is showing signs of improvement which is great news. His right leg is completely healed. Overall I feel like he is doing excellent and there is no signs of infection. 11/07/2020 upon evaluation today patient appears to be doing well with regard to  this maintains closure. Electronic Signature(s) Signed: 08/06/2023 10:21:33 AM By: [name] Derry PA-C Entered By: [name] on 08/06/2023 07:21:33 -------------------------------------------------------------------------------- Physician Orders Details Patient Name: Date of Service: [name], [name] [name] 08/02/2023 1:45 PM Medical Record Number: [id number] Patient Account Number: [id number] Date of Birth/Sex: Treating RN: 27-Sep-1946 (77 y.o. 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 Bathing/ Shower/ Hygiene May shower; gently cleanse wound with antibacterial soap, rinse and pat dry prior to dressing wounds Additional Orders / Instructions Other: - apply Urea cream to heels at night, use heel cup for protection Electronic Signature(s) Signed: 08/02/2023 4:48:00 PM By: [name] Derry PA-C Signed: 08/03/2023 4:35:53 PM By: [name] Entered By: [name] on 08/02/2023 11:27:52 -------------------------------------------------------------------------------- Problem List Details Patient Name: Date of Service: [name], [name] [name] 08/02/2023 1:45 PM Medical Record Number: [id number] Patient Account Number: [id number] Date of Birth/Sex: Treating RN: April 08, 1946 (77 y.o. [name] Primary  Care Provider: Aram [name] Other Clinician: Betha [name] Referring Provider: Treating Provider/Extender: [name] in Treatment: [address] [place] ([id number]) [id number] Page 7 of 12 Active Problems ICD-10 Encounter Code Description Active Date MDM Diagnosis E11.621 Type 2 diabetes mellitus with foot ulcer 06/19/2022 No Yes L97.522 Non-pressure chronic ulcer of other part of left foot with fat layer exposed 06/19/2022 No Yes E11.42 Type 2 diabetes mellitus with diabetic polyneuropathy 06/19/2022 No Yes Inactive Problems Resolved Problems Electronic Signature(s) Signed: 08/02/2023 1:55:17 PM By: [name] Derry PA-C Entered By: [name] on 08/02/2023 10:55:17 -------------------------------------------------------------------------------- Progress Note Details Patient Name: Date of Service: [name], [name] [name] 08/02/2023 1:45 PM Medical Record Number: [id number] Patient Account Number: [id number] Date of Birth/Sex: Treating RN: 27-Sep-1946 (77 y.o. [name] Primary Care Provider: Aram [name] Other Clinician: Betha [name] Referring Provider: Treating Provider/Extender: [name] in Treatment: 58 Subjective Chief Complaint Information obtained from Patient Left heel ulcer History of Present Illness (HPI) 09/24/2020 on evaluation today patient presents today for a heel ulcer that he tells me has been present for about 2 years. He has been seeing podiatry and they have been attempting to manage this including what sounds to be a total contact cast, Unna boot, and just standard dressings otherwise as well. Most recently has been using triple antibiotic ointment. With that being said unfortunately despite everything he really has not had any significant improvement. He tells me that he cannot even really remember exactly how this began but he presumed it may have rubbed on his shoes or something  of that nature. 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 He tells me that this does seem to help. Fortunately there is no signs of active infection at this time. His most recent hemoglobin A1c was 8.1 though he knows this was this year he cannot  be secondary to the Tubigrip more than anything to be honest. Fortunately I do not see any signs of active infection at this time which is great news. No fevers, chills, nausea, vomiting, or diarrhea. 08-02-2023 upon evaluation today patient appears to be doing great in regard to his heel ulcer. He has been tolerating the dressing changes without complication. Fortunately I do not see any signs of active infection locally or systemically at this time. Objective Constitutional Well-nourished and well-hydrated in no acute distress. Vitals Time Taken: 2:05 PM, Height: 70 in, Weight: 265 lbs, BMI: 38, Temperature: 98.3 F, Pulse: 65 bpm, Respiratory Rate: 18 breaths/min, Blood Pressure: 134/57 mmHg. Respiratory normal breathing without difficulty. Psychiatric this patient is able to make decisions and demonstrates good insight into disease process. Alert and Oriented x 3. pleasant and cooperative. General Notes: Patient's wound bed actually showed signs of good granulation epithelization at this point. Fortunately I see no signs of worsening overall and I do believe that the patient is healed based on what I see. My suggestion is that we monitor just a little bit longer to ensure this maintains closure. 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 Bathing/ Shower/ Hygiene: [name] ([id number]) [id number] Page 12 of 12 May shower; gently cleanse wound with antibacterial soap, rinse and pat dry prior to dressing wounds Additional Orders / Instructions: Other: - apply Urea cream to heels at night, use heel cup for protection 1. I would recommend that we have the patient continue to monitor for any signs of infection or worsening. This includes the use of the urea cream to the heels at night which I think is doing very well. 2. Use an heel cup for protection. 3. Also can recommend that he continue to gently wash with antibacterial soap in the shower. We will see patient back for reevaluation in 3 weeks here in the clinic. If anything worsens or changes patient will contact our office for additional recommendations. This is confirming appointment to make sure that he is still closed and everything looks great. Electronic Signature(s) Signed: 08/06/2023 10:22:10 AM By: [name] Derry PA-C Entered By: [name] on 08/06/2023 07:22:10 -------------------------------------------------------------------------------- SuperBill Details Patient Name: Date of Service: [name], [name] [name] 08/02/2023 Medical Record Number: [id number] Patient Account Number: [id number] Date of Birth/Sex: Treating RN: 14-Dec-1945 (77 y.o. 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 We have been using Hydrofera Blue under Urgo K2 lite wrap he has been in his surgical shoe although he tells me it disintegrated and he came in on a regular running shoe today. 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 He is not wearing compression stocking 06-22-2023 upon evaluation today patient appears to be doing excellent in regard to his wound on the heel which is actually significantly smaller compared to last week's evaluation. Fortunately I do not see any signs of active infection locally or systemically at this time. 06-28-2023 upon evaluation today patient appears to be doing well currently in regard to his heel which is actually showing signs of improvement. Fortunately I do not see any evidence of active infection at this time which is great news and in general I do believe that we are making headway towards complete closure which is good news. No fevers, chills, nausea, vomiting, or diarrhea. 07-05-2023 upon evaluation today patient appears to be doing well currently in regard to his heel ulcer. This is actually very close I think that being completely healed. This is great news and overall very pleased with where we stand today. 07-12-2023 upon evaluation today patient appears to be doing well currently in regard to his wound. He in fact appears to be very close to complete resolution I am very pleased that regard. However on the tip of his foot there appears to be some issue here with inflammation secondary to infection. I am very concerned about the infection at the site. 07-19-2023 upon evaluation today patient appears to actually be doing well in  regard to his heel. Fortunately I do not see any signs of worsening overall. In general I think you are making headway towards closure. 07-26-2023 upon evaluation today patient appears to be doing well currently in regard to his wound. He has in fact completely healed and looks to be doing great. I still want to keep an eye on things due to the end of his foot being red although I think this may be secondary to the Tubigrip more than anything to be honest. Fortunately I do not see any signs of active infection at this time which is great news. No fevers, chills, nausea, vomiting, or diarrhea. 08-02-2023 upon evaluation today patient appears to be doing great in regard to his heel ulcer. He has been tolerating the dressing changes without complication. Fortunately I do not see any signs of active infection locally or systemically at this time. Electronic Signature(s) Signed: 08/06/2023 10:21:16 AM By: [name] Derry PA-C Entered By: [name] on 08/06/2023 07:21:16 -------------------------------------------------------------------------------- Physical Exam Details Patient Name: Date of Service: [name], [name] [name] 08/02/2023 1:45 PM Medical Record Number: [id number] Patient Account Number: [id number] Date of Birth/Sex: Treating RN: 16-Jan-1946 (77 y.o. [name] Primary Care Provider: Aram [name] Other Clinician: Betha [name] Referring Provider: Treating Provider/Extender: Hermine Messick Weeks in Treatment: 67 Constitutional Well-nourished and well-hydrated in no acute distress. Respiratory normal breathing without difficulty. Psychiatric this patient is able to make decisions and demonstrates good insight into disease process. Alert and Oriented x 3. pleasant and cooperative. Notes Patient's wound bed actually showed signs of good granulation epithelization at this point. Fortunately I see no signs of worsening overall and I do believe that [name], [name]  ([id number]) [id number].pdf Page 6 of 12 the patient is healed based on what I see. My suggestion is that we monitor just a little bit longer to ensure

## 2023-08-02 NOTE — Progress Notes (Addendum)
Support Surface(s) Assessment (bed, cushion, seat, etc.) INTERVENTIONS - Wound Cleansing / Measurement []  - 0 Simple Wound Cleansing - one wound []  - 0 Complex Wound Cleansing - multiple wounds []  - 0 Wound Imaging (photographs - any number of wounds) []  - 0 Wound Tracing (instead of photographs) []  - 0 Simple Wound Measurement - one wound []  - 0 Complex Wound Measurement - multiple wounds INTERVENTIONS - Wound Dressings []  - 0 Small Wound Dressing one or multiple wounds []  - 0 Medium Wound Dressing one or multiple wounds []  - 0 Large Wound Dressing one or multiple wounds []  - 0 Application of Medications - topical []  - 0 Application of Medications - injection INTERVENTIONS - Miscellaneous []  - 0 External ear exam [name], [name] ([id number]) [id number] Page 3 of 7 []  - 0 Specimen Collection (cultures, biopsies, blood, body fluids, etc.) []  - 0 Specimen(s) / Culture(s) sent or taken to Lab for analysis []  - 0 Patient Transfer (multiple staff / Michiel Sites Lift / Similar devices) []  - 0 Simple Staple / Suture removal (25 or less) []  - 0 Complex Staple / Suture removal (26 or more) []  - 0 Hypo / Hyperglycemic Management (close monitor of Blood Glucose) []  - 0 Ankle / Brachial Index (ABI) - do not check if billed separately X- 1 5 Vital Signs Has the patient been seen at the hospital within the last three years: Yes Total Score: 55 Level Of Care: New/Established - Level 2 Electronic Signature(s) Signed: 08/03/2023 4:35:53 PM By: Betha Loa Entered By: Betha Loa on 08/02/2023 11:29:05 -------------------------------------------------------------------------------- Encounter Discharge Information  Details Patient Name: Date of Service: [name], [name] [name] 08/02/2023 1:45 PM Medical Record Number: [id number] Patient Account Number: [id number] Date of Birth/Sex: Treating RN: 1946-01-27 (77 y.o. [name]) [name] Primary Care Leavy Heatherly: [name] Other Clinician: Betha Loa Referring Parlee Amescua: Treating Bevin Mayall/Extender: [name] in Treatment: 36 Encounter Discharge Information Items Discharge Condition: Stable Ambulatory Status: Walker Discharge Destination: Home Transportation: Private Auto Accompanied By: self Schedule Follow-up Appointment: Yes Clinical Summary of Care: Electronic Signature(s) Signed: 08/03/2023 4:35:53 PM By: Betha Loa Entered By: Betha Loa on 08/02/2023 11:35:15 Lower Extremity Assessment Details -------------------------------------------------------------------------------- [name] ([id number]) [id number] Page 4 of 7 Patient Name: Date of Service: [name], [name] [name] 08/02/2023 1:45 PM Medical Record Number: [id number] Patient Account Number: [id number] Date of Birth/Sex: Treating RN: Feb 21, 1946 (77 y.o. [name]) [name] Primary Care Rashee Marschall: [name] Other Clinician: Betha Loa Referring Shalissa Easterwood: Treating Calum Cormier/Extender: Hermine Messick Weeks in Treatment: 24 Electronic Signature(s) Signed: 08/03/2023 4:35:53 PM By: Betha Loa Signed: 08/06/2023 11:41:18 AM By: [name] RN Entered By: Betha Loa on 08/02/2023 11:20:26 -------------------------------------------------------------------------------- Multi Wound Chart Details Patient Name: Date of Service: [name], [name] [name] 08/02/2023 1:45 PM Medical Record Number: [id number] Patient Account Number: [id number] Date of Birth/Sex: Treating RN: 1946/08/03 (77 y.o. [name] Primary Care Syon Tews: [name] Other Clinician: Betha Loa Referring Marven Veley: Treating  Odena Mcquaid/Extender: [name] in Treatment: 70 Vital Signs Height(in): 70 Pulse(bpm): 65 Weight(lbs): 265 Blood Pressure(mmHg): 134/57 Body Mass Index(BMI): 38 Temperature(F): 98.3 Respiratory Rate(breaths/min): 18 [Treatment Notes:Wound Assessments Treatment Notes] Electronic Signature(s) Signed: 08/03/2023 4:35:53 PM By: Betha Loa Entered By: Betha Loa on 08/02/2023 11:20:37 -------------------------------------------------------------------------------- Multi-Disciplinary Care Plan Details Patient Name: Date of Service: [name], [name] [name] 08/02/2023 1:45 PM Medical Record Number: [id number] Patient Account Number: [id number] Date of Birth/Sex: Treating RN: 1946-05-07 (77 y.o. [name]) [name] Primary Care Chrisy Hillebrand:  [name], [name] ([id number]) [id number] Page 1 of 7 Visit Report for 08/02/2023 Arrival Information Details Patient Name: Date of Service: [name], [name] [facility] 08/02/2023 1:45 PM Medical Record Number: [id number] Patient Account Number: [id number] Date of Birth/Sex: Treating RN: 1945/12/16 (77 y.o. [name]) [name] Primary Care Aiza Vollrath: [name] Other Clinician: Betha Loa Referring Altair Appenzeller: Treating Marney Treloar/Extender: [name] in Treatment: 75 Visit Information History Since Last Visit All ordered tests and consults were completed: No Patient Arrived: [name] Added or deleted any medications: No Arrival Time: 14:04 Any new allergies or adverse reactions: No Transfer Assistance: None Had a fall or experienced change in No Patient Identification Verified: Yes activities of daily living that may affect Secondary Verification Process Completed: Yes risk of falls: Patient Requires Transmission-Based Precautions: No Signs or symptoms of abuse/neglect since last visito No Patient Has Alerts: Yes Hospitalized since last visit: No Patient Alerts: Patient on Blood Thinner Implantable device outside of the clinic excluding No Warfarin cellular tissue based products placed in the center Type II Diabetic since last visit: Has Dressing in Place as Prescribed: Yes Pain Present Now: No Electronic Signature(s) Signed: 08/03/2023 4:35:53 PM By: Betha Loa Entered By: Betha Loa on 08/02/2023 11:05:15 -------------------------------------------------------------------------------- Clinic Level of Care Assessment Details Patient Name: Date of Service: [name] [facility] J. 08/02/2023 1:45 PM Medical Record Number: [id number] Patient Account Number: [id number] Date of Birth/Sex: Treating RN: Jan 13, 1946 (77 y.o. [name]) [name] Primary Care [place]le[place] Lemaster: [name] Other Clinician: Betha Loa Referring  Shanikia Kernodle: Treating Kester Stimpson/Extender: [name] in Treatment: 58 Clinic Level of Care Assessment Items TOOL 4 Quantity Score []  - 0 Use when only an EandM is performed on FOLLOW-UP visit ASSESSMENTS - Nursing Assessment / Reassessment X- 1 10 Reassessment of Co-morbidities (includes updates in patient status) X- 1 5 Reassessment of Adherence to Treatment Plan [name], [name] ([id number]) [id number].pdf Page 2 of 7 ASSESSMENTS - Wound and Skin A ssessment / Reassessment []  - 0 Simple Wound Assessment / Reassessment - one wound []  - 0 Complex Wound Assessment / Reassessment - multiple wounds X- 1 10 Dermatologic / Skin Assessment (not related to wound area) ASSESSMENTS - Focused Assessment []  - 0 Circumferential Edema Measurements - multi extremities []  - 0 Nutritional Assessment / Counseling / Intervention []  - 0 Lower Extremity Assessment (monofilament, tuning fork, pulses) []  - 0 Peripheral Arterial Disease Assessment (using hand held doppler) ASSESSMENTS - Ostomy and/or Continence Assessment and Care []  - 0 Incontinence Assessment and Management []  - 0 Ostomy Care Assessment and Management (repouching, etc.) PROCESS - Coordination of Care X - Simple Patient / Family Education for ongoing care 1 15 []  - 0 Complex (extensive) Patient / Family Education for ongoing care []  - 0 Staff obtains Chiropractor, Records, T Results / Process Orders est []  - 0 Staff telephones HHA, Nursing Homes / Clarify orders / etc []  - 0 Routine Transfer to another Facility (non-emergent condition) []  - 0 Routine Hospital Admission (non-emergent condition) []  - 0 New Admissions / Manufacturing engineer / Ordering NPWT Apligraf, etc. , []  - 0 Emergency Hospital Admission (emergent condition) X- 1 10 Simple Discharge Coordination []  - 0 Complex (extensive) Discharge Coordination PROCESS - Special Needs []  - 0 Pediatric / Minor Patient  Management []  - 0 Isolation Patient Management []  - 0 Hearing / Language / Visual special needs []  - 0 Assessment of Community assistance (transportation, D/C planning, etc.) []  - 0 Additional assistance / Altered mentation []  - 0  Support Surface(s) Assessment (bed, cushion, seat, etc.) INTERVENTIONS - Wound Cleansing / Measurement []  - 0 Simple Wound Cleansing - one wound []  - 0 Complex Wound Cleansing - multiple wounds []  - 0 Wound Imaging (photographs - any number of wounds) []  - 0 Wound Tracing (instead of photographs) []  - 0 Simple Wound Measurement - one wound []  - 0 Complex Wound Measurement - multiple wounds INTERVENTIONS - Wound Dressings []  - 0 Small Wound Dressing one or multiple wounds []  - 0 Medium Wound Dressing one or multiple wounds []  - 0 Large Wound Dressing one or multiple wounds []  - 0 Application of Medications - topical []  - 0 Application of Medications - injection INTERVENTIONS - Miscellaneous []  - 0 External ear exam [name], [name] ([id number]) [id number] Page 3 of 7 []  - 0 Specimen Collection (cultures, biopsies, blood, body fluids, etc.) []  - 0 Specimen(s) / Culture(s) sent or taken to Lab for analysis []  - 0 Patient Transfer (multiple staff / Michiel Sites Lift / Similar devices) []  - 0 Simple Staple / Suture removal (25 or less) []  - 0 Complex Staple / Suture removal (26 or more) []  - 0 Hypo / Hyperglycemic Management (close monitor of Blood Glucose) []  - 0 Ankle / Brachial Index (ABI) - do not check if billed separately X- 1 5 Vital Signs Has the patient been seen at the hospital within the last three years: Yes Total Score: 55 Level Of Care: New/Established - Level 2 Electronic Signature(s) Signed: 08/03/2023 4:35:53 PM By: Betha Loa Entered By: Betha Loa on 08/02/2023 11:29:05 -------------------------------------------------------------------------------- Encounter Discharge Information  Details Patient Name: Date of Service: [name], [name] [name] 08/02/2023 1:45 PM Medical Record Number: [id number] Patient Account Number: [id number] Date of Birth/Sex: Treating RN: 1946-01-27 (77 y.o. [name]) [name] Primary Care Leavy Heatherly: [name] Other Clinician: Betha Loa Referring Parlee Amescua: Treating Bevin Mayall/Extender: [name] in Treatment: 36 Encounter Discharge Information Items Discharge Condition: Stable Ambulatory Status: Walker Discharge Destination: Home Transportation: Private Auto Accompanied By: self Schedule Follow-up Appointment: Yes Clinical Summary of Care: Electronic Signature(s) Signed: 08/03/2023 4:35:53 PM By: Betha Loa Entered By: Betha Loa on 08/02/2023 11:35:15 Lower Extremity Assessment Details -------------------------------------------------------------------------------- [name] ([id number]) [id number] Page 4 of 7 Patient Name: Date of Service: [name], [name] [name] 08/02/2023 1:45 PM Medical Record Number: [id number] Patient Account Number: [id number] Date of Birth/Sex: Treating RN: Feb 21, 1946 (77 y.o. [name]) [name] Primary Care Rashee Marschall: [name] Other Clinician: Betha Loa Referring Shalissa Easterwood: Treating Calum Cormier/Extender: Hermine Messick Weeks in Treatment: 24 Electronic Signature(s) Signed: 08/03/2023 4:35:53 PM By: Betha Loa Signed: 08/06/2023 11:41:18 AM By: [name] RN Entered By: Betha Loa on 08/02/2023 11:20:26 -------------------------------------------------------------------------------- Multi Wound Chart Details Patient Name: Date of Service: [name], [name] [name] 08/02/2023 1:45 PM Medical Record Number: [id number] Patient Account Number: [id number] Date of Birth/Sex: Treating RN: 1946/08/03 (77 y.o. [name] Primary Care Syon Tews: [name] Other Clinician: Betha Loa Referring Marven Veley: Treating  Odena Mcquaid/Extender: [name] in Treatment: 70 Vital Signs Height(in): 70 Pulse(bpm): 65 Weight(lbs): 265 Blood Pressure(mmHg): 134/57 Body Mass Index(BMI): 38 Temperature(F): 98.3 Respiratory Rate(breaths/min): 18 [Treatment Notes:Wound Assessments Treatment Notes] Electronic Signature(s) Signed: 08/03/2023 4:35:53 PM By: Betha Loa Entered By: Betha Loa on 08/02/2023 11:20:37 -------------------------------------------------------------------------------- Multi-Disciplinary Care Plan Details Patient Name: Date of Service: [name], [name] [name] 08/02/2023 1:45 PM Medical Record Number: [id number] Patient Account Number: [id number] Date of Birth/Sex: Treating RN: 1946-05-07 (77 y.o. [name]) [name] Primary Care Chrisy Hillebrand:

## 2023-08-04 DIAGNOSIS — J42 Unspecified chronic bronchitis: Secondary | ICD-10-CM | POA: Diagnosis not present

## 2023-08-04 DIAGNOSIS — J849 Interstitial pulmonary disease, unspecified: Secondary | ICD-10-CM | POA: Diagnosis not present

## 2023-08-05 DIAGNOSIS — R791 Abnormal coagulation profile: Secondary | ICD-10-CM | POA: Diagnosis not present

## 2023-08-09 ENCOUNTER — Ambulatory Visit: Payer: Medicare HMO | Admitting: Physician Assistant

## 2023-08-09 DIAGNOSIS — E538 Deficiency of other specified B group vitamins: Secondary | ICD-10-CM | POA: Diagnosis not present

## 2023-08-10 DIAGNOSIS — I2510 Atherosclerotic heart disease of native coronary artery without angina pectoris: Secondary | ICD-10-CM | POA: Diagnosis not present

## 2023-08-10 DIAGNOSIS — I6310 Cerebral infarction due to embolism of unspecified precerebral artery: Secondary | ICD-10-CM | POA: Diagnosis not present

## 2023-08-10 DIAGNOSIS — T826XXD Infection and inflammatory reaction due to cardiac valve prosthesis, subsequent encounter: Secondary | ICD-10-CM | POA: Diagnosis not present

## 2023-08-10 DIAGNOSIS — I700 Atherosclerosis of aorta: Secondary | ICD-10-CM | POA: Diagnosis not present

## 2023-08-10 DIAGNOSIS — I480 Paroxysmal atrial fibrillation: Secondary | ICD-10-CM | POA: Diagnosis not present

## 2023-08-10 DIAGNOSIS — Z952 Presence of prosthetic heart valve: Secondary | ICD-10-CM | POA: Diagnosis not present

## 2023-08-10 DIAGNOSIS — I350 Nonrheumatic aortic (valve) stenosis: Secondary | ICD-10-CM | POA: Diagnosis not present

## 2023-08-10 DIAGNOSIS — I10 Essential (primary) hypertension: Secondary | ICD-10-CM | POA: Diagnosis not present

## 2023-08-10 DIAGNOSIS — E782 Mixed hyperlipidemia: Secondary | ICD-10-CM | POA: Diagnosis not present

## 2023-08-23 ENCOUNTER — Ambulatory Visit: Payer: Medicare HMO | Admitting: Physician Assistant

## 2023-08-23 ENCOUNTER — Ambulatory Visit: Payer: Medicare HMO | Admitting: Neurology

## 2023-08-23 DIAGNOSIS — R42 Dizziness and giddiness: Secondary | ICD-10-CM | POA: Diagnosis not present

## 2023-08-23 DIAGNOSIS — H903 Sensorineural hearing loss, bilateral: Secondary | ICD-10-CM | POA: Diagnosis not present

## 2023-08-31 ENCOUNTER — Telehealth: Payer: Self-pay

## 2023-08-31 ENCOUNTER — Encounter: Payer: Medicare HMO | Attending: Physician Assistant | Admitting: Physician Assistant

## 2023-08-31 DIAGNOSIS — Z7901 Long term (current) use of anticoagulants: Secondary | ICD-10-CM | POA: Insufficient documentation

## 2023-08-31 DIAGNOSIS — Z79899 Other long term (current) drug therapy: Secondary | ICD-10-CM | POA: Diagnosis not present

## 2023-08-31 DIAGNOSIS — Z952 Presence of prosthetic heart valve: Secondary | ICD-10-CM | POA: Diagnosis not present

## 2023-08-31 DIAGNOSIS — E1142 Type 2 diabetes mellitus with diabetic polyneuropathy: Secondary | ICD-10-CM | POA: Insufficient documentation

## 2023-08-31 DIAGNOSIS — I480 Paroxysmal atrial fibrillation: Secondary | ICD-10-CM | POA: Insufficient documentation

## 2023-08-31 DIAGNOSIS — S60410A Abrasion of right index finger, initial encounter: Secondary | ICD-10-CM | POA: Diagnosis not present

## 2023-08-31 DIAGNOSIS — I10 Essential (primary) hypertension: Secondary | ICD-10-CM | POA: Insufficient documentation

## 2023-08-31 DIAGNOSIS — E11622 Type 2 diabetes mellitus with other skin ulcer: Secondary | ICD-10-CM | POA: Diagnosis not present

## 2023-08-31 DIAGNOSIS — Z79891 Long term (current) use of opiate analgesic: Secondary | ICD-10-CM | POA: Diagnosis not present

## 2023-08-31 DIAGNOSIS — X58XXXA Exposure to other specified factors, initial encounter: Secondary | ICD-10-CM | POA: Insufficient documentation

## 2023-08-31 DIAGNOSIS — S60511A Abrasion of right hand, initial encounter: Secondary | ICD-10-CM | POA: Diagnosis not present

## 2023-08-31 NOTE — Progress Notes (Signed)
[name] ([id number]) [id number] Page 1 of 8 Visit Report for 08/31/2023 Arrival Information Details Patient Name: Date of Service: [name], [name] J. 08/31/2023 1:45 PM Medical Record Number: [id number] Patient Account Number: [id number] Date of Birth/Sex: Treating RN: May 12, 1946 (77 y.o. Roel [name] Primary Care Derell Bruun: [name] Other Clinician: Betha Loa Referring Edan Juday: Treating Mishawn Didion/Extender: [name] in Treatment: 50 Visit Information History Since Last Visit All ordered tests and consults were completed: No Patient Arrived: [name] Added or deleted any medications: No Arrival Time: 13:49 Any new allergies or adverse reactions: No Transfer Assistance: None Had a fall or experienced change in No Patient Identification Verified: Yes activities of daily living that may affect Secondary Verification Process Completed: Yes risk of falls: Patient Requires Transmission-Based Precautions: No Signs or symptoms of abuse/neglect since last visito No Patient Has Alerts: Yes Hospitalized since last visit: No Patient Alerts: Patient on Blood Thinner Implantable device outside of the clinic excluding No Warfarin cellular tissue based products placed in the [name] Type II Diabetic since last visit: Pain Present Now: No Electronic Signature(s) Unsigned Entered ByBetha Loa on 08/31/2023 10:52:11 -------------------------------------------------------------------------------- Clinic Level of Care Assessment Details Patient Name: Date of Service: [name] [facility] J. 08/31/2023 1:45 PM Medical Record Number: [id number] Patient Account Number: [id number] Date of Birth/Sex: Treating RN: 1946-05-11 (77 y.o. Roel [name] Primary Care Shaana Acocella: [name] Other Clinician: Betha Loa Referring Lenae Wherley: Treating Earlisha Sharples/Extender: [name] in Treatment:  62 Clinic Level of Care Assessment Items TOOL 1 Quantity Score []  - 0 Use when EandM and Procedure is performed on INITIAL visit ASSESSMENTS - Nursing Assessment / Reassessment []  - 0 [name] Physical Exam (combine w/ comprehensive assessment (listed just below) when performed on new pt. [address], SARAFIN ([id number]) [id number] Page 2 of 8 []  - 0 Comprehensive Assessment (HX, ROS, Risk Assessments, Wounds Hx, etc.) ASSESSMENTS - Wound and Skin Assessment / Reassessment []  - 0 Dermatologic / Skin Assessment (not related to wound area) ASSESSMENTS - Ostomy and/or Continence Assessment and Care []  - 0 Incontinence Assessment and Management []  - 0 Ostomy Care Assessment and Management (repouching, etc.) PROCESS - Coordination of Care []  - 0 Simple Patient / Family Education for ongoing care []  - 0 Complex (extensive) Patient / Family Education for ongoing care []  - 0 Staff obtains Chiropractor, Records, T Results / Process Orders est []  - 0 Staff telephones HHA, Nursing Homes / Clarify orders / etc []  - 0 Routine Transfer to another Facility (non-emergent condition) []  - 0 Routine Hospital Admission (non-emergent condition) []  - 0 New Admissions / Manufacturing engineer / Ordering NPWT Apligraf, etc. , []  - 0 Emergency Hospital Admission (emergent condition) PROCESS - Special Needs []  - 0 Pediatric / Minor Patient Management []  - 0 Isolation Patient Management []  - 0 Hearing / Language / Visual special needs []  - 0 Assessment of Community assistance (transportation, D/C planning, etc.) []  - 0 Additional assistance / Altered mentation []  - 0 Support Surface(s) Assessment (bed, cushion, seat, etc.) INTERVENTIONS - Miscellaneous []  - 0 External ear exam []  - 0 Patient Transfer (multiple staff / Nurse, adult / Similar devices) []  - 0 Simple Staple / Suture removal (25 or less) []  - 0 Complex Staple / Suture removal (26 or more) []  -  0 Hypo/Hyperglycemic Management (do not check if billed separately) []  - 0 Ankle / Brachial Index (ABI) - do not check if billed separately Has the patient been seen  at the hospital within the last three years: Yes Total Score: 0 Level Of Care: ____ Electronic Signature(s) Unsigned Entered By: Betha Loa on 08/31/2023 11:28:04 -------------------------------------------------------------------------------- Encounter Discharge Information Details Patient Name: Date of Service: [name], [name] [name] J. 08/31/2023 1:45 PM Medical Record Number: [id number] Patient Account Number: [id number] [name] ([id number]) [phone].pdf Page 3 of 8 Date of Birth/Sex: Treating RN: Jan 06, 1946 (77 y.o. Roel [name] Primary Care Johnnye Sandford: [name] Other Clinician: Betha Loa Referring Makyna Niehoff: Treating Luddie [name]/Extender: [name] in Treatment: 16 Encounter Discharge Information Items Post Procedure Vitals Discharge Condition: Stable Temperature (F): 98.7 Ambulatory Status: Walker Pulse (bpm): 68 Discharge Destination: Home Respiratory Rate (breaths/min): 18 Transportation: Other Blood Pressure (mmHg): 133/84 Accompanied By: self Schedule Follow-up Appointment: Yes Clinical Summary of Care: Electronic Signature(s) Unsigned Entered ByBetha Loa on 08/31/2023 11:40:08 -------------------------------------------------------------------------------- Lower Extremity Assessment Details Patient Name: Date of Service: [name] [facility] J. 08/31/2023 1:45 PM Medical Record Number: [id number] Patient Account Number: [id number] Date of Birth/Sex: Treating RN: 10-31-1945 (77 y.o. Roel [name] Primary Care [name]: [name] Other Clinician: Betha Loa Referring [name]: Treating Sheriff Rodenberg/Extender: Hermine Messick Weeks in Treatment: 57 Electronic Signature(s) Unsigned Entered By: Betha Loa  on 08/31/2023 11:08:52 -------------------------------------------------------------------------------- Multi Wound Chart Details Patient Name: Date of Service: [name], [name] [name] J. 08/31/2023 1:45 PM Medical Record Number: [id number] Patient Account Number: [id number] Date of Birth/Sex: Treating RN: 07-19-1946 (77 y.o. Roel [name] Primary Care Amillion Macchia: [name] Other Clinician: Betha Loa Referring Jourden Delmont: Treating Tonio Seider/Extender: Hermine Messick Weeks in Treatment: 70 Vital Signs Height(in): 70 Pulse(bpm): [address] JAION, RAVELO ([id number]) [id number] Page 4 of 8 Weight(lbs): 265 Blood Pressure(mmHg): 133/84 Body Mass Index(BMI): 38 Temperature(F): 98.7 Respiratory Rate(breaths/min): 18 [16:Photos:] [N/A:N/A] Right Hand - 2nd Digit N/A N/A Wound Location: Blister N/A N/A Wounding Event: Pressure Ulcer N/A N/A Primary Etiology: Cataracts, Arrhythmia, Coronary N/A N/A Comorbid History: Artery Disease, Hypertension, Type II Diabetes, Osteoarthritis, Neuropathy 08/18/2023 N/A N/A Date Acquired: 0 N/A N/A Weeks of Treatment: Open N/A N/A Wound Status: No N/A N/A Wound Recurrence: 2.5x1x0.1 N/A N/A Measurements L x W x D (cm) 1.963 N/A N/A A (cm) : rea 0.196 N/A N/A Volume (cm) : Category/Stage II N/A N/A Classification: Medium N/A N/A Exudate A mount: Serosanguineous N/A N/A Exudate Type: red, brown N/A N/A Exudate Color: Medium (34-66%) N/A N/A Granulation A mount: Red N/A N/A Granulation Quality: Medium (34-66%) N/A N/A Necrotic A mount: Eschar, Adherent Slough N/A N/A Necrotic Tissue: Fat Layer (Subcutaneous Tissue): Yes N/A N/A Exposed Structures: Fascia: No Tendon: No Muscle: No Joint: No Bone: No Treatment Notes Electronic Signature(s) Unsigned Entered ByBetha Loa on 08/31/2023  11:08:57 -------------------------------------------------------------------------------- Multi-Disciplinary Care Plan Details Patient Name: Date of Service: [name] [facility] J. 08/31/2023 1:45 PM Medical Record Number: [id number] Patient Account Number: [id number] Date of Birth/Sex: Treating RN: [birth year] (77 y.o. Roel [name] Primary Care Yacoub Diltz: [name] Other Clinician: Betha Loa Referring Owyn Raulston: Treating Kohner Orlick/Extender: [name] in Treatment: [address] ADELINE, HUEBSCH ([id number]) [id number] Page 5 of 8 Wound/Skin Impairment Nursing Diagnoses: Impaired tissue integrity Goals: Patient/caregiver will verbalize understanding of skin care regimen Date Initiated: 06/19/2022 Target Resolution Date: 07/19/2022 Goal Status: Active Ulcer/skin breakdown will have a volume reduction of 30% by week 4 Date Initiated: 06/19/2022 Date Inactivated: 07/28/2022 Target Resolution Date: 07/17/2022 Goal Status: Unmet Unmet Reason: infection Ulcer/skin breakdown will have a volume reduction of 50% by week 8 Date Initiated:  07/28/2022 Date Inactivated: 05/18/2023 Target Resolution Date: 03/20/2023 Goal Status: Unmet Unmet Reason: comorbidities Interventions: Assess patient/caregiver ability to obtain necessary supplies Assess patient/caregiver ability to perform ulcer/skin care regimen upon admission and as needed Assess ulceration(s) every visit Provide education on ulcer and skin care Treatment Activities: Skin care regimen initiated : 06/19/2022 Topical wound management initiated : 06/19/2022 Notes: Electronic Signature(s) Unsigned Entered By: Betha Loa on 08/31/2023 11:38:49 -------------------------------------------------------------------------------- Pain Assessment Details Patient Name: Date of Service: [name] [facility] J. 08/31/2023 1:45 PM Medical Record Number: [id number] Patient Account  Number: [id number] Date of Birth/Sex: Treating RN: 1945-11-28 (77 y.o. Roel [name] Primary Care Nyelli Samara: [name] Other Clinician: Betha Loa Referring Yer Castello: Treating Dyland Panuco/Extender: Hermine Messick Weeks in Treatment: 21 Active Problems Location of Pain Severity and Description of Pain Patient Has Paino No Site Locations EDY, KRANER ([id number]) [id number] Page 6 of 8 Pain Management and Medication Current Pain Management: Electronic Signature(s) Unsigned Entered By: Betha Loa on 08/31/2023 10:55:38 -------------------------------------------------------------------------------- Patient/Caregiver Education Details Patient Name: Date of Service: [name] [facility] 11/12/2024andnbsp1:45 PM Medical Record Number: [id number] Patient Account Number: [id number] Date of Birth/Gender: Treating RN: 05/20/1946 (77 y.o. Roel [name] Primary Care Physician: [name] Other Clinician: Betha Loa Referring Physician: Treating Physician/Extender: [name] in Treatment: 76 Education Assessment Education Provided To: Patient Education Topics Provided Wound/Skin Impairment: Handouts: Other: continue wound care as directed Methods: Explain/Verbal Responses: State content correctly Electronic Signature(s) Unsigned Entered By: Betha Loa on 08/31/2023 11:39:07 Signature(s): Barrie Folk ([id number]) Date(s): [phone].pdf Page 7 of 8 -------------------------------------------------------------------------------- Wound Assessment Details Patient Name: Date of Service: [name], [name] [name] J. 08/31/2023 1:45 PM Medical Record Number: [id number] Patient Account Number: [id number] Date of Birth/Sex: Treating RN: Jul 27, 1946 (77 y.o. Roel [name] Primary Care Onelia Cadmus: [name] Other Clinician: Betha Loa Referring Saylor Sheckler: Treating  Aunesty Tyson/Extender: Hermine Messick Weeks in Treatment: 62 Wound Status Wound Number: 16 Primary Abrasion Etiology: Wound Location: Right Hand - 2nd Digit Wound Open Wounding Event: Blister Status: Date Acquired: 08/18/2023 Comorbid Cataracts, Arrhythmia, Coronary Artery Disease, Hypertension, Weeks Of Treatment: 0 History: Type II Diabetes, Osteoarthritis, Neuropathy Clustered Wound: No Photos Wound Measurements Length: (cm) Width: (cm) Depth: (cm) Area: (cm) Volume: (cm) 2.5 % Reduction in Area: 1 % Reduction in Volume: 0.1 1.963 0.196 Wound Description Classification: Full Thickness Without Exposed Sup Exudate Amount: Medium Exudate Type: Serosanguineous Exudate Color: red, brown port Structures Wound Bed Granulation Amount: Medium (34-66%) Exposed Structure Granulation Quality: Red Fascia Exposed: No Necrotic Amount: Medium (34-66%) Fat Layer (Subcutaneous Tissue) Exposed: Yes Necrotic Quality: Eschar, Adherent Slough Tendon Exposed: No Muscle Exposed: No Joint Exposed: No Bone Exposed: No Treatment Notes Wound #16 (Hand - 2nd Digit) Wound Laterality: Right Cleanser Normal Saline Discharge Instruction: Wash your hands with soap and water. Remove old dressing, discard into plastic bag and place into trash. Cleanse the wound with Normal Saline prior to applying a clean dressing using gauze sponges, not tissues or cotton balls. Do not scrub or use excessive force. Pat dry using gauze sponges, not tissue or cotton balls. JERSEN, NULTY ([id number]) [id number] Page 8 of 8 Soap and Water Discharge Instruction: Gently cleanse wound with antibacterial soap, rinse and pat dry prior to dressing wounds Peri-Wound Care Topical Primary Dressing Xeroform-HBD 2x2 (in/in) Discharge Instruction: Apply Xeroform-HBD 2x2 (in/in) as directed Secondary Dressing Conforming Guaze Roll-Small Discharge Instruction: Apply Conforming  Stretch Guaze Bandage as directed Gauze Discharge Instruction: As directed: dry, moistened with saline  or moistened with Dakins Solution Secured With [employer], Latex-free, Size 5, Small-Head / Shoulder / Thigh Discharge Instruction: size 3 Compression Wrap Compression Stockings Add-Ons Electronic Signature(s) Signed: 08/31/2023 2:18:58 PM By: [name] Derry PA-C Entered By: [name] Derry on 08/31/2023 11:18:58 -------------------------------------------------------------------------------- Vitals Details Patient Name: Date of Service: [name], [name] [name] J. 08/31/2023 1:45 PM Medical Record Number: [id number] Patient Account Number: [id number] Date of Birth/Sex: Treating RN: [birth year] (77 y.o. Roel [name] Primary Care Andie Mungin: [name] Other Clinician: Betha Loa Referring Draven Laine: Treating Deriona Altemose/Extender: Hermine Messick Weeks in Treatment: 31 Vital Signs Time Taken: 13:53 Temperature (F): 98.7 Height (in): 70 Pulse (bpm): 65 Weight (lbs): 265 Respiratory Rate (breaths/min): 18 Body Mass Index (BMI): 38 Blood Pressure (mmHg): 133/84 Reference Range: 80 - 120 mg / dl Electronic Signature(s) Unsigned Entered ByBetha Loa on 08/31/2023 10:55:34 Signature(s): Date(s):

## 2023-08-31 NOTE — Telephone Encounter (Signed)
Call to patient, no answer. Left message to return call regarding getting patient scheduled 11/18/22

## 2023-08-31 NOTE — Progress Notes (Signed)
LYNDELL, MEADORS ([id number]) [id number] Page 1 of 3 Visit Report for 08/31/2023 Chief Complaint Document Details Patient Name: Date of Service: [name], [name] [facility] J. 08/31/2023 1:45 PM Medical Record Number: [id number] Patient Account Number: [id number] Date of Birth/Sex: Treating RN: 11-Mar-1946 (77 y.o. [name] Primary Care Provider: [name] Other Clinician: Betha Loa Referring Provider: Treating Provider/Extender: Hermine Messick Weeks in Treatment: 81 Information Obtained from: Patient Chief Complaint Right index finger ulcer Electronic Signature(s) Signed: 08/31/2023 2:20:00 PM By: [name] Derry PA-C Previous Signature: 08/31/2023 1:44:40 PM Version By: [name] Derry PA-C Entered By: [name] Derry on 08/31/2023 11:20:00 -------------------------------------------------------------------------------- Debridement Details Patient Name: Date of Service: [name], [name] [name] 08/31/2023 1:45 PM Medical Record Number: [id number] Patient Account Number: [id number] Date of Birth/Sex: Treating RN: 1946-02-20 (77 y.o. [name] Primary Care Provider: [name] Other Clinician: Betha Loa Referring Provider: Treating Provider/Extender: [name] in Treatment: 62 Debridement Performed for Assessment: Wound #16 Right Hand - 2nd Digit Performed By: Physician [name] Derry, PA-C Debridement Type: Debridement Level of Consciousness (Pre-procedure): Awake and Alert Pre-procedure Verification/Time Out Yes - 14:22 Taken: Start Time: 14:22 Percent of Wound Bed Debrided: 100% T Area Debrided (cm): otal 1.96 Tissue and other material debrided: Viable, Non-Viable, Slough, Subcutaneous, Skin: Dermis , Skin: Epidermis, Slough Level: Skin/Subcutaneous Tissue Debridement Description: Excisional Instrument: Curette Bleeding: Minimum Hemostasis Achieved: Pressure Response to Treatment: Procedure was  tolerated well Level of Consciousness (Post- Awake and Alert procedure): ANVITH, HARNISCH ([id number]) [id number] Page 2 of 3 Post Debridement Measurements of Total Wound Length: (cm) 2.5 Width: (cm) 1 Depth: (cm) 0.1 Volume: (cm) 0.196 Character of Wound/Ulcer Post Debridement: Stable Post Procedure Diagnosis Same as Pre-procedure Electronic Signature(s) Unsigned Entered ByBetha Loa on 08/31/2023 11:27:40 -------------------------------------------------------------------------------- Physician Orders Details Patient Name: Date of Service: [name], [name] [name] 08/31/2023 1:45 PM Medical Record Number: [id number] Patient Account Number: [id number] Date of Birth/Sex: Treating RN: May 02, 1946 (77 y.o. [name] Primary Care Provider: [name] Other Clinician: Betha Loa Referring Provider: Treating Provider/Extender: [name] in Treatment: 2 The following information was scribed by: Betha Loa The information was scribed for: [name] Derry Verbal / Phone Orders: No Diagnosis Coding ICD-10 Coding Code Description E11.622 Type 2 diabetes mellitus with other skin ulcer S60.410A Abrasion of right index finger, initial encounter E11.42 Type 2 diabetes mellitus with diabetic polyneuropathy Follow-up Appointments Return Appointment in 1 week. Nurse Visit as needed [employer] wounds with antibacterial soap and water. Additional Orders / Instructions Other: - apply Urea cream to heels at night, use heel cup for protection Wound Treatment Wound #16 - Hand - 2nd Digit Wound Laterality: Right Cleanser: Normal Saline 1 x Per Day/30 Days Discharge Instructions: Wash your hands with soap and water. Remove old dressing, discard into plastic bag and place into trash. Cleanse the wound with Normal Saline prior to applying a clean dressing using gauze sponges, not tissues or cotton balls. Do  not scrub or use excessive force. Pat dry using gauze sponges, not tissue or cotton balls. Cleanser: Soap and Water 1 x Per Day/30 Days Discharge Instructions: Gently cleanse wound with antibacterial soap, rinse and pat dry prior to dressing wounds Prim Dressing: Xeroform-HBD 2x2 (in/in) 1 x Per Day/30 Days ary Discharge Instructions: Apply Xeroform-HBD 2x2 (in/in) as directed Secondary Dressing: Conforming Guaze Roll-Small 1 x Per Day/30 Days [name], [name] ([id number]) [id number] Page 3 of 3 Discharge Instructions: Apply Conforming Stretch Guaze Bandage  as directed Secondary Dressing: Gauze 1 x Per Day/30 Days Discharge Instructions: As directed: dry, moistened with saline or moistened with Dakins Solution Secured With: [employer] Dressing, Latex-free, Size 5, Small-Head / Shoulder / Thigh 1 x Per Day/30 Days Discharge Instructions: size 3 Electronic Signature(s) Unsigned Entered By: Betha Loa on 08/31/2023 11:27:57 -------------------------------------------------------------------------------- Problem List Details Patient Name: Date of Service: [name] [facility] J. 08/31/2023 1:45 PM Medical Record Number: [id number] Patient Account Number: [id number] Date of Birth/Sex: Treating RN: [birth year] (77 y.o. [name] Primary Care Provider: [name] Other Clinician: Betha Loa Referring Provider: Treating Provider/Extender: Hermine Messick Weeks in Treatment: 38 Active Problems ICD-10 Encounter Code Description Active Date MDM Diagnosis E11.622 Type 2 diabetes mellitus with other skin ulcer 06/19/2022 No Yes S60.410A Abrasion of right index finger, initial encounter 08/31/2023 No Yes E11.42 Type 2 diabetes mellitus with diabetic polyneuropathy 06/19/2022 No Yes Inactive Problems Resolved Problems ICD-10 Code Description Active Date Resolved Date L97.522 Non-pressure chronic ulcer of other part of left foot with fat  layer exposed 06/19/2022 06/19/2022 Electronic Signature(s) Signed: 08/31/2023 2:19:44 PM By: [name] Derry PA-C Previous Signature: 08/31/2023 1:39:32 PM Version By: [name] Derry PA-C Entered By: [name] Derry on 08/31/2023 11:19:44

## 2023-09-02 DIAGNOSIS — R791 Abnormal coagulation profile: Secondary | ICD-10-CM | POA: Diagnosis not present

## 2023-09-08 DIAGNOSIS — G894 Chronic pain syndrome: Secondary | ICD-10-CM | POA: Diagnosis not present

## 2023-09-08 DIAGNOSIS — M47896 Other spondylosis, lumbar region: Secondary | ICD-10-CM | POA: Diagnosis not present

## 2023-09-09 ENCOUNTER — Encounter: Payer: Medicare HMO | Admitting: Physician Assistant

## 2023-09-09 DIAGNOSIS — R791 Abnormal coagulation profile: Secondary | ICD-10-CM | POA: Diagnosis not present

## 2023-09-09 DIAGNOSIS — Z7901 Long term (current) use of anticoagulants: Secondary | ICD-10-CM | POA: Diagnosis not present

## 2023-09-09 DIAGNOSIS — I480 Paroxysmal atrial fibrillation: Secondary | ICD-10-CM | POA: Diagnosis not present

## 2023-09-09 DIAGNOSIS — I10 Essential (primary) hypertension: Secondary | ICD-10-CM | POA: Diagnosis not present

## 2023-09-09 DIAGNOSIS — Z952 Presence of prosthetic heart valve: Secondary | ICD-10-CM | POA: Diagnosis not present

## 2023-09-09 DIAGNOSIS — E11622 Type 2 diabetes mellitus with other skin ulcer: Secondary | ICD-10-CM | POA: Diagnosis not present

## 2023-09-09 DIAGNOSIS — Z79899 Other long term (current) drug therapy: Secondary | ICD-10-CM | POA: Diagnosis not present

## 2023-09-09 DIAGNOSIS — Z79891 Long term (current) use of opiate analgesic: Secondary | ICD-10-CM | POA: Diagnosis not present

## 2023-09-09 DIAGNOSIS — S60410A Abrasion of right index finger, initial encounter: Secondary | ICD-10-CM | POA: Diagnosis not present

## 2023-09-09 DIAGNOSIS — E1142 Type 2 diabetes mellitus with diabetic polyneuropathy: Secondary | ICD-10-CM | POA: Diagnosis not present

## 2023-09-09 NOTE — Progress Notes (Addendum)
[name], [name] ([id number]) [id number] Page 1 of 8 Visit Report for 09/09/2023 Arrival Information Details Patient Name: Date of Service: [name], [name] [facility] J. 09/09/2023 1:45 PM Medical Record Number: [id number] Patient Account Number: [id number] Date of Birth/Sex: Treating RN: Jan 21, 1946 (77 y.o. [name] Primary Care [name]: [name] Other Clinician: Betha [name] Referring [name]: Treating [name]/Extender: [name] in Treatment: 75 Visit Information History Since Last Visit All ordered tests and consults were completed: No Patient Arrived: [name] Added or deleted any medications: No Arrival Time: 14:11 Any new allergies or adverse reactions: No Transfer Assistance: None Had a fall or experienced change in No Patient Identification Verified: Yes activities of daily living that may affect Secondary Verification Process Completed: Yes risk of falls: Patient Requires Transmission-Based Precautions: No Signs or symptoms of abuse/neglect since last visito No Patient Has Alerts: Yes Hospitalized since last visit: No Patient Alerts: Patient on Blood Thinner Implantable device outside of the clinic excluding No Warfarin cellular tissue based products placed in the center Type II Diabetic since last visit: Has Dressing in Place as Prescribed: Yes Pain Present Now: No Electronic Signature(s) Signed: 09/09/2023 4:53:18 PM By: [name] Entered By: [name] on 09/09/2023 14:12:52 -------------------------------------------------------------------------------- Clinic Level of Care Assessment Details Patient Name: Date of Service: [name] [facility] J. 09/09/2023 1:45 PM Medical Record Number: [id number] Patient Account Number: [id number] Date of Birth/Sex: Treating RN: 12-Jan-1946 (77 y.o. [name] Primary Care Calyn Rubi: [name] Other Clinician: Betha [name] Referring  Delrico Minehart: Treating [name]/Extender: [name] in Treatment: 77 Clinic Level of Care Assessment Items TOOL 1 Quantity Score []  - 0 Use when EandM and Procedure is performed on INITIAL visit ASSESSMENTS - Nursing Assessment / Reassessment []  - 0 [name] Physical Exam (combine w/ comprehensive assessment (listed just below) when performed on new pt. evals) []  - 0 Comprehensive Assessment (HX, ROS, Risk Assessments, Wounds Hx, etc.) [name], [name] ([id number]) [phone] Page 2 of 8 ASSESSMENTS - Wound and Skin Assessment / Reassessment []  - 0 Dermatologic / Skin Assessment (not related to wound area) ASSESSMENTS - Ostomy and/or Continence Assessment and Care []  - 0 Incontinence Assessment and Management []  - 0 Ostomy Care Assessment and Management (repouching, etc.) PROCESS - Coordination of Care []  - 0 Simple Patient / Family Education for ongoing care []  - 0 Complex (extensive) Patient / Family Education for ongoing care []  - 0 Staff obtains Chiropractor, Records, T Results / Process Orders est []  - 0 Staff telephones HHA, Nursing Homes / Clarify orders / etc []  - 0 Routine Transfer to another Facility (non-emergent condition) []  - 0 Routine Hospital Admission (non-emergent condition) []  - 0 New Admissions / Manufacturing engineer / Ordering NPWT Apligraf, etc. , []  - 0 Emergency Hospital Admission (emergent condition) PROCESS - Special Needs []  - 0 Pediatric / Minor Patient Management []  - 0 Isolation Patient Management []  - 0 Hearing / Language / Visual special needs []  - 0 Assessment of Community assistance (transportation, D/C planning, etc.) []  - 0 Additional assistance / Altered mentation []  - 0 Support Surface(s) Assessment (bed, cushion, seat, etc.) INTERVENTIONS - Miscellaneous []  - 0 External ear exam []  - 0 Patient Transfer (multiple staff / Nurse, adult / Similar devices) []  - 0 Simple Staple /  Suture removal (25 or less) []  - 0 Complex Staple / Suture removal (26 or more) []  - 0 Hypo/Hyperglycemic Management (do not check if billed separately) []  - 0 Ankle / Brachial Index (  ABI) - do not check if billed separately Has the patient been seen at the hospital within the last three years: Yes Total Score: 0 Level Of Care: ____ Electronic Signature(s) Signed: 09/09/2023 4:53:18 PM By: [name] Entered By: [name] on 09/09/2023 14:40:38 -------------------------------------------------------------------------------- Encounter Discharge Information Details Patient Name: Date of Service: [name], [name] [name] 09/09/2023 1:45 PM Medical Record Number: [id number] Patient Account Number: [id number] Date of Birth/Sex: Treating RN: 22-Jul-1946 (77 y.o. [name] Primary Care Zaccai Chavarin: [name] Other Clinician: Betha [name] Referring Lila Lufkin: Treating Corbett Moulder/Extender: [name], [name] ([id number]) [id number] Page 3 of 8 Weeks in Treatment: 47 Encounter Discharge Information Items Post Procedure Vitals Discharge Condition: Stable Temperature (F): 99.2 Ambulatory Status: Walker Pulse (bpm): 78 Discharge Destination: Home Respiratory Rate (breaths/min): 18 Transportation: Private Auto Blood Pressure (mmHg): 144/56 Accompanied By: self Schedule Follow-up Appointment: Yes Clinical Summary of Care: Electronic Signature(s) Signed: 09/09/2023 4:53:18 PM By: [name] Entered By: [name] on 09/09/2023 15:01:53 -------------------------------------------------------------------------------- Lower Extremity Assessment Details Patient Name: Date of Service: [name] [facility] J. 09/09/2023 1:45 PM Medical Record Number: [id number] Patient Account Number: [id number] Date of Birth/Sex: Treating RN: [birth year] (77 y.o. [name] Primary Care Russel Morain: [name] Other Clinician:  Betha [name] Referring Jackelyne Sayer: Treating Naturi Alarid/Extender: Hermine Messick Weeks in Treatment: 11 Electronic Signature(s) Signed: 09/09/2023 4:35:52 PM By: Angelina Pih Signed: 09/09/2023 4:53:18 PM By: [name] Entered By: [name] on 09/09/2023 14:23:51 -------------------------------------------------------------------------------- Multi Wound Chart Details Patient Name: Date of Service: [name], [name] [name] 09/09/2023 1:45 PM Medical Record Number: [id number] Patient Account Number: [id number] Date of Birth/Sex: Treating RN: 09-14-1946 (77 y.o. [name] Primary Care Nava Song: [name] Other Clinician: Betha [name] Referring Nzinga Ferran: Treating Elara Cocke/Extender: Hermine Messick Weeks in Treatment: 35 Vital Signs Height(in): 70 Pulse(bpm): 78 Weight(lbs): 265 Blood Pressure(mmHg): 144/56 Body Mass Index(BMI): 38 Temperature(F): 99.2 Respiratory Rate(breaths/min): [address] ([id number]):Photos:] [id number].pdf Page 4 of 8:16 N/A N/A N/A N/A] Right Hand - 2nd Digit N/A N/A Wound Location: Blister N/A N/A Wounding Event: Abrasion N/A N/A Primary Etiology: Cataracts, Arrhythmia, Coronary N/A N/A Comorbid History: Artery Disease, Hypertension, Type II Diabetes, Osteoarthritis, Neuropathy 08/18/2023 N/A N/A Date Acquired: 1 N/A N/A Weeks of Treatment: Open N/A N/A Wound Status: No N/A N/A Wound Recurrence: 1.5x0.9x0.1 N/A N/A Measurements [name] x W x D (cm) 1.06 N/A N/A A (cm) : rea 0.106 N/A N/A Volume (cm) : 46.00% N/A N/A % Reduction in Area: 45.90% N/A N/A % Reduction in Volume: Full Thickness Without Exposed N/A N/A Classification: Support Structures Medium N/A N/A Exudate A mount: Serosanguineous N/A N/A Exudate Type: red, brown N/A N/A Exudate Color: Medium (34-66%) N/A N/A Granulation Amount: Red N/A N/A Granulation Quality: Medium (34-66%) N/A  N/A Necrotic Amount: Eschar, Adherent Slough N/A N/A Necrotic Tissue: Fat Layer (Subcutaneous Tissue): Yes N/A N/A Exposed Structures: Fascia: No Tendon: No Muscle: No Joint: No Bone: No Treatment Notes Electronic Signature(s) Signed: 09/09/2023 4:53:18 PM By: [name] Entered By: [name] on 09/09/2023 14:23:57 -------------------------------------------------------------------------------- Multi-Disciplinary Care Plan Details Patient Name: Date of Service: [name], [name] [name] 09/09/2023 1:45 PM Medical Record Number: [id number] Patient Account Number: [id number] Date of Birth/Sex: Treating RN: 09-30-1946 (77 y.o. [name] Primary Care Bexlee Bergdoll: [name] Other Clinician: Betha [name] Referring Aaralynn Shepheard: Treating Shyheem Whitham/Extender: Hermine Messick Weeks in Treatment: 33 Active Inactive Wound/Skin Impairment Nursing Diagnoses: [name], [name] ([id number]) [id number] Page 5 of 8 Impaired tissue  integrity Goals: Patient/caregiver will verbalize understanding of skin care regimen Date Initiated: 06/19/2022 Target Resolution Date: 07/19/2022 Goal Status: Active Ulcer/skin breakdown will have a volume reduction of 30% by week 4 Date Initiated: 06/19/2022 Date Inactivated: 07/28/2022 Target Resolution Date: 07/17/2022 Goal Status: Unmet Unmet Reason: infection Ulcer/skin breakdown will have a volume reduction of 50% by week 8 Date Initiated: 07/28/2022 Date Inactivated: 05/18/2023 Target Resolution Date: 03/20/2023 Goal Status: Unmet Unmet Reason: comorbidities Interventions: Assess patient/caregiver ability to obtain necessary supplies Assess patient/caregiver ability to perform ulcer/skin care regimen upon admission and as needed Assess ulceration(s) every visit Provide education on ulcer and skin care Treatment Activities: Skin care regimen initiated : 06/19/2022 Topical wound management initiated :  06/19/2022 Notes: Electronic Signature(s) Signed: 09/09/2023 4:35:52 PM By: Angelina Pih Signed: 09/09/2023 4:53:18 PM By: [name] Entered By: [name] on 09/09/2023 14:40:49 -------------------------------------------------------------------------------- Pain Assessment Details Patient Name: Date of Service: [name], [name] [name] 09/09/2023 1:45 PM Medical Record Number: [id number] Patient Account Number: [id number] Date of Birth/Sex: Treating RN: 1945-10-28 (77 y.o. [name] Primary Care Shanetra Blumenstock: [name] Other Clinician: Betha [name] Referring Haseeb Fiallos: Treating Claron Rosencrans/Extender: [name] in Treatment: 57 Active Problems Location of Pain Severity and Description of Pain Patient Has Paino No Site Locations [name], [name] ([id number]) [phone].pdf Page 6 of 8 Pain Management and Medication Current Pain Management: Electronic Signature(s) Signed: 09/09/2023 4:35:52 PM By: Angelina Pih Signed: 09/09/2023 4:53:18 PM By: [name] Entered By: [name] on 09/09/2023 14:15:13 -------------------------------------------------------------------------------- Patient/Caregiver Education Details Patient Name: Date of Service: [name], [name] [name] 11/21/2024andnbsp1:45 PM Medical Record Number: [id number] Patient Account Number: [id number] Date of Birth/Gender: Treating RN: 1946/09/21 (77 y.o. [name] Primary Care Physician: [name] Other Clinician: Betha [name] Referring Physician: Treating Physician/Extender: [name] in Treatment: 12 Education Assessment Education Provided To: Patient Education Topics Provided Wound/Skin Impairment: Handouts: Other: continue wound care as directed Methods: Explain/Verbal Responses: State content correctly Electronic Signature(s) Signed: 09/09/2023 4:53:18 PM By: [name] Entered By: [name] on 09/09/2023 14:41:14 -------------------------------------------------------------------------------- Wound Assessment Details Patient Name: Date of Service: [name], [name] [name] 09/09/2023 1:45 PM Medical Record Number: [id number] Patient Account Number: [id number] Date of Birth/Sex: Treating RN: [birth year] (77 y.o. [name] Primary Care Day Greb: [name] Other Clinician: Betha [name] Referring [name]: Treating Scarlett Portlock/Extender: Hermine Messick Weeks in Treatment: [address] HYMIE, LOJEK ([id number]) [id number] Page 7 of 8 Wound Number: 16 Primary Abrasion Etiology: Wound Location: Right Hand - 2nd Digit Wound Open Wounding Event: Blister Status: Date Acquired: 08/18/2023 Comorbid Cataracts, Arrhythmia, Coronary Artery Disease, Hypertension, Weeks Of Treatment: 1 History: Type II Diabetes, Osteoarthritis, Neuropathy Clustered Wound: No Photos Wound Measurements Length: (cm) 1.5 Width: (cm) 0.9 Depth: (cm) 0.1 Area: (cm) 1.06 Volume: (cm) 0.106 % Reduction in Area: 46% % Reduction in Volume: 45.9% Wound Description Classification: Full Thickness Without Exposed Sup Exudate Amount: Medium Exudate Type: Serosanguineous Exudate Color: red, brown port Structures Wound Bed Granulation Amount: Medium (34-66%) Exposed Structure Granulation Quality: Red Fascia Exposed: No Necrotic Amount: Medium (34-66%) Fat Layer (Subcutaneous Tissue) Exposed: Yes Necrotic Quality: Eschar, Adherent Slough Tendon Exposed: No Muscle Exposed: No Joint Exposed: No Bone Exposed: No Treatment Notes Wound #16 (Hand - 2nd Digit) Wound Laterality: Right Cleanser Normal Saline Discharge Instruction: Wash your hands with soap and water. Remove old dressing, discard into plastic bag and place into trash. Cleanse the wound with Normal Saline prior to applying a clean  dressing using gauze sponges, not tissues or cotton  balls. Do not scrub or use excessive force. Pat dry using gauze sponges, not tissue or cotton balls. Soap and Water Discharge Instruction: Gently cleanse wound with antibacterial soap, rinse and pat dry prior to dressing wounds Peri-Wound Care Topical Primary Dressing Xeroform-HBD 2x2 (in/in) Discharge Instruction: Apply Xeroform-HBD 2x2 (in/in) as directed Secondary Dressing Conforming Guaze Roll-Small Discharge Instruction: Apply Conforming Stretch Guaze Bandage as directed Gauze Discharge Instruction: As directed: dry, moistened with saline or moistened with Dakins Solution Secured With [employer] Food Dressing, Latex-free, Size 5, Small-Head / Shoulder / Thigh Discharge Instruction: size 3 Compression [name], [name] ([id number]) [phone].pdf Page 8 of 8 Compression Stockings Add-Ons Electronic Signature(s) Signed: 09/09/2023 4:35:52 PM By: Angelina Pih Signed: 09/09/2023 4:53:18 PM By: [name] Entered By: [name] on 09/09/2023 14:23:39 -------------------------------------------------------------------------------- Vitals Details Patient Name: Date of Service: [name], [name] [name] 09/09/2023 1:45 PM Medical Record Number: [id number] Patient Account Number: [id number] Date of Birth/Sex: Treating RN: 06-14-1946 (77 y.o. [name] Primary Care Greogory Cornette: [name] Other Clinician: Betha [name] Referring Emyah Roznowski: Treating Jaylan Duggar/Extender: [name] in Treatment: 61 Vital Signs Time Taken: 14:13 Temperature (F): 99.2 Height (in): 70 Pulse (bpm): 78 Weight (lbs): 265 Respiratory Rate (breaths/min): 18 Body Mass Index (BMI): 38 Blood Pressure (mmHg): 144/56 Reference Range: 80 - 120 mg / dl Electronic Signature(s) Signed: 09/09/2023 4:53:18 PM By: [name] Entered By: [name] on 09/09/2023 14:15:06

## 2023-09-09 NOTE — Progress Notes (Addendum)
[name], [name] ([id number]) [id number] Page 1 of 14 Visit Report for 09/09/2023 Chief Complaint Document Details Patient Name: Date of Service: [name], [name] [facility] J. 09/09/2023 1:45 PM Medical Record Number: [id number] Patient Account Number: [id number] Date of Birth/Sex: Treating RN: Nov 05, 1945 (77 y.o. [name] Primary Care Provider: [name] Other Clinician: Betha Loa Referring Provider: Treating Provider/Extender: Hermine Messick Weeks in Treatment: 23 Information Obtained from: Patient Chief Complaint Right index finger ulcer Electronic Signature(s) Signed: 09/09/2023 1:41:16 PM By: [name] Derry PA-C Entered By: [name] Derry on 09/09/2023 10:41:16 -------------------------------------------------------------------------------- Debridement Details Patient Name: Date of Service: [name], [name] [name] J. 09/09/2023 1:45 PM Medical Record Number: [id number] Patient Account Number: [id number] Date of Birth/Sex: Treating RN: [birth year] (77 y.o. [name] Primary Care Provider: [name] Other Clinician: Betha Loa Referring Provider: Treating Provider/Extender: [name] in Treatment: 63 Debridement Performed for Assessment: Wound #16 Right Hand - 2nd Digit Performed By: Physician [name] Derry, PA-C The following information was scribed by: Betha Loa The information was scribed for: [name] Derry Debridement Type: Debridement Level of Consciousness (Pre-procedure): Awake and Alert Pre-procedure Verification/Time Out Yes - 14:38 Taken: Start Time: 14:38 Percent of Wound Bed Debrided: 100% T Area Debrided (cm): otal 1.06 Tissue and other material debrided: Viable, Non-Viable Level: Non-Viable Tissue Debridement Description: Selective/Open Wound Instrument: Curette Bleeding: Minimum Hemostasis Achieved: Pressure Response to Treatment: Procedure was tolerated well Level of  Consciousness [name], [name] ([id number]) [phone].pdf Page 2 of 14 Level of Consciousness (Post- Awake and Alert procedure): Post Debridement Measurements of Total Wound Length: (cm) 1.5 Width: (cm) 0.9 Depth: (cm) 0.1 Volume: (cm) 0.106 Character of Wound/Ulcer Post Debridement: Stable Post Procedure Diagnosis Same as Pre-procedure Electronic Signature(s) Signed: 09/09/2023 4:35:52 PM By: Angelina Pih Signed: 09/09/2023 4:53:18 PM By: Betha Loa Signed: 09/09/2023 6:01:43 PM By: [name] Derry PA-C Entered By: Betha Loa on 09/09/2023 11:42:27 -------------------------------------------------------------------------------- HPI Details Patient Name: Date of Service: [name], [name] [name] J. 09/09/2023 1:45 PM Medical Record Number: [id number] Patient Account Number: [id number] Date of Birth/Sex: Treating RN: 28-Jan-1946 (77 y.o. [name] Primary Care Provider: [name] Other Clinician: Betha Loa Referring Provider: Treating Provider/Extender: [name] in Treatment: 63 History of Present Illness HPI Description: 09/24/2020 on evaluation today patient presents today for a heel ulcer that he tells me has been present for about 2 years. He has been seeing podiatry and they have been attempting to manage this including what sounds to be a total contact cast, Unna boot, and just standard dressings otherwise as well. Most recently has been using triple antibiotic ointment. With that being said unfortunately despite everything he really has not had any significant improvement. He tells me that he cannot even really remember exactly how this began but he presumed it may have rubbed on his shoes or something of that nature. 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 He tells me that this does seem to help. Fortunately there is no signs of active infection at this time. His most recent hemoglobin A1c was 8.1 though he knows this was this year he cannot tell me the exact time. His fluid pills currently to help with some  of the lower extremity edema although he does obviously have signs of venous stasis/lymphedema. Currently there is no evidence of active infection. No fevers, chills, nausea, vomiting, or diarrhea. Patient has had fairly recent ABIs which were performed on 07/19/2020 and revealed that he has normal findings in both the ankle and toe locations bilaterally. His ABI on the right was 1.09 on the left was 1.08 with a TBI on the right of 0.88 and on the left of 0.94. Triphasic flow was noted throughout. 10/08/2020 on evaluation today patient appears to be doing pretty well in regard to his left heel currently in fact this is doing a great job and seems to be healing quite nicely. Unfortunately on his right leg he had a pile of wood that actually fell on him injuring his right leg this is somewhat erythematous has me concerned little bit about cellulitis though there is not really a good area to culture at this point. 10/24/2020 upon evaluation today patient appears to be doing well with regard to his heel ulcer. He is showing signs of improvement which is great news. His right leg is completely healed. Overall I feel like he is doing excellent and there is no signs of infection. 11/07/2020 upon evaluation today patient appears to be doing well with regard to his heel ulcer. He tells me that last week when he was unable to come in his wife actually thought that the wound was very close to closing if not closed. Then it began to "reopen again". I really feel like what may have happened as the collagen may have dried over the  wound bed and that because that misunderstanding with thinking that the wound was healing. With that being said I did not see it last week I do not know that for certain. Either way I feel like he is doing great today I see no signs of infection at this point. 11/26/2020 upon inspection today patient appears to be doing decently well in regard to his heel ulcer. He has been tolerating the dressing changes without complication. Fortunately there is no sign of active infection at this time. No fevers, chills, nausea, vomiting, or diarrhea. 12/10/2020 upon evaluation today patient appears to be doing fairly well in regard to the wound on his heel as well as what appears to be a new wound of the left first metatarsal head plantar aspect. This seems to be an area that was callus that has split as the patient tells me has been trying to walk on his toes more has probably where this came from. With that being said there does not appear to be signs of active infection which is great news. 12/17/2020 upon evaluation today patient actually appears to be making good progress currently. Fortunately there is no evidence of active infection at this time. Overall I feel like he is very close to complete closure. 12/26/2020 upon evaluation today patient appears to be doing excellent in regard to his wounds. In fact I am not certain that these are not even completely [name], [name] ([id number]) [phone].pdf Page 3 of 14 healed on initial inspection. Overall I am very pleased with where things stand today. Good news is that they are healed he is actually get ready to go out of town and that will be helpful as well as he will be a full part of the time. Readmission: 02/04/2021 upon evaluation today patient appears to be doing well at this point in regard to his left heel that I  previously saw him for. Unfortunately he is having issues with his right lower extremity. He has significant wounds at  this point he also has erythema noted there is definite signs of cellulitis which is unfortunate. With that being said I think we do need to address this sooner rather than later. The good news is he did have a nice trip to the beach. He tells me that he had no issues during that time. 4/27; patient on Bactrim. Culture showed methicillin sensitive staph aureus therefore the Bactrim should be effective. 2 small areas on the right leg are healed the area on the mid aspect of the tibia almost 100% covered in a very adherent necrotic debris. We have been using silver alginate 02/20/2021 upon evaluation today patient appears to be doing well with regard to his wound. He is showing signs of improvement which is great news overall very pleased with where things stand today. No fevers, chills, nausea, vomiting, or diarrhea. 02/27/2021 upon evaluation today patient appears to be doing well with regard to his leg ulcer. He is tolerating the dressing changes and overall appears to be doing quite excellent. I am extremely pleased with where things stand and overall I think patient is making great progress. There is no sign of active infection at this time which is also great news. 03/06/2021 upon evaluation today patient appears to be doing excellent in regard to his wounds. In fact he appears to be completely healed today based on what I am seeing. This is excellent news and overall I am extremely pleased with where he stands. Overall the patient is happy to hear this as well this has been quite sometime coming. Readmission: 04/01/2021 patient unfortunately returns for readmission today. He tells me that he has been wearing his compression socks on the right leg daily and he does not really know what is going on and why his legs are doing what they are doing. We did therefore go ahead and probe deeper into exactly what has been going on with him today. Subsequently the patient tells me that when he gets up and what  we would call "first thing in the morning" are really 2 different things". He does not tend to sleep well so he tells me that he will often wake up at 3:00 in the morning. He will then potentially going into the living room to get in his chair where he may read a book for a little while and then potentially fall asleep back in his chair. He then subsequently wake up around 5:00 or so and then get up and in his words "putter around". This often will end with him proceeding at some point around 8 AM or 9 AM to putting on his compression socks. With that being said this means that anywhere from the 3:00 in the morning till roughly around 9:00 in the morning he has no compression on yet he is sitting in his recliner, walking around and up and about, and this is at least 5 to 6 hours of noncompressed time. That may be our issue here but I did not realize until we discussed this further today. Fortunately there does not appear to be any signs of active infection at this time which is great news. With that being said he has multiple wounds of the bilateral lower extremities. 04/10/2021 upon evaluation today patient appears to be doing well with regard to his legs for the most part. He does look like he had some injury where his  wrap slid down nonetheless he did some "doctoring on them". I do feel like most of the areas honestly have cleared back up which is great news. There does not appear to be any signs of active infection at this time which is also great news. No fevers, chills, nausea, vomiting, or diarrhea. 04/18/2021 upon evaluation today patient appears to be doing well with regard to his wounds in fact he appears to be completely healed which is great news. Fortunately there does not appear to be any signs of active infection which is great news. No fevers, chills, nausea, vomiting, or diarrhea. READMISSION 07/28/2021 This is a now 77 year old man we have had in this clinic for quite a bit of this  year. 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 He wears compression stockings. 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 He comes in an old running shoes although he says he wears better shoes at home. His history is that he noticed a blister in the right posterior heel. This open. He has been applying Neosporin to it currently the wound measures 2 x 1.4 cm. 100% slough covered. Not really offloading this in any rigorous way. Past medical history is essentially unchanged he has paroxysmal atrial fibrillation on Coumadin type 2 diabetes with a recent hemoglobin A1c of 7 on metformin. ABI in our clinic was 0.98 on the right 08/05/2021 upon evaluation today patient appears to be doing well with regard to his heel ulcer. Fortunately there does not appear to be any signs of active infection at this time. Overall I been very pleased with where things seem to be currently as far as the wound healing is concerned. Again this is first a lot of seeing him Dr. Leanord Hawking readmitted him last week. Nonetheless I think we are definitely making some progress here. 08/11/2021 upon evaluation today patient's wound on the heel actually showing signs of excellent improvement. I am actually very pleased with where things stand currently. No fevers, chills, nausea, vomiting, or diarrhea. There does not appear to be any need for sharp debridement today either which is also great news. 08/25/2021 upon evaluation today patient appears to be doing well with regard to his heel ulcer. This is actually looking significantly improved compared to last time I saw him. Fortunately there does not appear to be any evidence of active infection at this time. No fevers, chills, nausea, vomiting, or diarrhea. 09/01/2021 upon evaluation today patient appears to be doing decently well in regard to his wounds. Fortunately there does  not appear to be any signs of active infection at this time which is great news and overall very pleased in that regard. I do not see any evidence of active infection systemically which is great news as well. No fevers, chills, nausea, vomiting, or diarrhea. I think that the heel is making excellent progress. 09/15/2021 upon evaluation today patient's heel unfortunately is significantly worse compared to what it was previous. I do believe that at this time he would benefit from switching back to something a little bit more offloading from the cushion shoe he has right now this is more of a heel protector what I really need is a Prevalon offloading boot which I think is good to do a lot better for him than just the small heel protector. 09/23/2021 upon evaluation today patient appears to be doing a little better in regards to last week's visit. Overall I  think that he is making good progress which is great news and there does not appear to be any evidence of active infection at this time. No fevers, chills, nausea, vomiting, or diarrhea. 09/30/2021 upon evaluation patient's heel is actually showing signs of good improvement which is great news. Fortunately there does not appear to be any evidence of active infection locally nor systemically at this point. No fevers, chills, nausea, vomiting, or diarrhea. 10/07/2021 upon evaluation today patient appears to be doing well currently in regard to his heel ulcer. He has been tolerating the dressing changes without complication. Fortunately I do not see any signs of active infection at this time which is great news. No fevers, chills, nausea, vomiting, or diarrhea. 12/27; wound is measuring smaller. We have been using silver alginate 10/28/2021 upon evaluation today patient appears to be doing well currently in regard to his heel ulcer. I am actually very pleased with where things stand and I think he is making excellent progress. Fortunately I do not see any  signs of active infection locally nor systemically at this point which is great news. Nonetheless I do believe that the patient would benefit from a new offloading shoe and has been using it Velcro is no longer functioning properly and he tells me that he almost tripped and fell because of that today. Obviously I do not want him following that would be very bad. [name], [name] ([id number]) [id number] Page 4 of 14 11/11/2021 upon evaluation today patient appears to be doing excellent in regard to his wound. In fact this appears to be completely healed based on what I see currently. I do not see any signs of anything open or draining and I did double check just by clearing away some of the callus around the edges of the wound to ensure that there was nothing still open or hiding underneath. Once this was cleared away it appeared that the patient was doing significantly better at this time which is great news and there was nothing actually open. Readmission: 06-19-2022 upon evaluation today patient appears to be doing well currently in regard to his heel ulcer all things considered. He unfortunately is having a bit of breakdown in general as far as the heel is concerned not nearly as bad as what we noted last time he was here in the clinic. The good news is I do think that this should hopefully heal much more rapidly than what we noted last time. His past medical history has not changed he is on Coumadin. 07-03-2022 upon evaluation today patient appears to be doing better in regard to his heel. We will start to see some improvement here which is good news. Fortunately I do not see any evidence of infection locally or systemically at this time which is great news. No fevers, chills, nausea, vomiting, or diarrhea. 07-10-2022 upon evaluation today patient appears to be doing well currently in regard to his wound from the callus standpoint around the edges. Unfortunately from an  actual wound bed standpoint he has some deep tissue injury noted at this point. Again I am not sure exactly what is going on that is causing this pressure to the region but he is definitely gotten pressure that is occurring and causing this to not heal as effectively as what we would like to see. I discussed with him today that he may need to at night when he sleeping use a pillow up underneath his calf in order to prevent the heel from touching the  bed at all. I also think that it may be beneficial for him to monitor throughout his day and make sure there is no other time when he is getting the pressure to the heel. 07-23-2022 upon evaluation today patient appears to be doing poorly currently in regard to his heel ulcer. He has been tolerating the dressing changes without complication. Unfortunately I feel like he may be infected based on what I am seeing. 07-28-2022 I did review patient's culture results which showed positive for Proteus as well as Staphylococcus both of which would be treated with the Bactrim DS that I gave him at the last visit. This is good news and hopefully means that we should be able to apply the cast today as long as the wound itself does not appear to be doing poorly. Patient's wound bed actually showed signs of doing quite well and I am very pleased with where things stand and I do not see any signs of worsening overall which is great news. 08-04-2022 upon evaluation today patient appears to be doing well currently in regard to his heel ulcer. I am actually very pleased with where things stand and I do think this is looking significantly better. With regard to his right shin that is also showing signs of improvement which is great news. 08-10-2022 upon evaluation today patient's wound on the heel actually appears to be doing significantly better. In regard to the right anterior shin this is showing signs of healing it might even be completely healed but I still get a monitor  I cannot get anything to fill away but also could not see where there was anything actually draining at this point. I am tending to think healed but I want a monitor 1 more week before I call it for sure. 08-17-2022 upon evaluation today patient appears to be doing well currently in regard to his heel. Fortunately he is tolerating the dressing changes without complication. I do not see any signs of infection and overall I think he is making good progress here. We did get approval for the Apligraf but I think he had a $295 co-pay that would have to be paid per application. With that being said as good as he is doing right now I do not think that is necessary but is still an option depending on how things progress if we need to speed this up quite a bit. 08/24/2022; this patient has a wound on his left heel in the setting of type 2 diabetes. We have been using Prisma. He has a heel offloading boot 08-31-2022 upon evaluation today patient appears to be doing poorly in regard to his heel ulcer which is still showing signs of bruising I am just not as happy as I was when I saw him 2 weeks ago with this. He saw Dr. Leanord Hawking last week. Also did not look good at that point apparently. Nonetheless I think that the patient is still continuing to have some counterpressure getting to the wound bed unfortunately. 09-07-2022 upon evaluation today patient appears to be doing well currently in regard to his heel ulcer. He has been tolerating the dressing changes without complication. Fortunately there does not appear to be any signs of active infection locally or systemically at this time. Fortunately I do not see any evidence of active infection at this time. 09-15-2022 upon evaluation today patient appears to be doing well currently in regard to his legs which in fact appear to be completely healed this is great news.  With that being said his heel does have some erythema and warmth as well as drainage I am concerned  about cellulitis at this location. 09-21-2022 upon evaluation today patient's wound is actually showing signs of being a little bit smaller but still we are not doing nearly as well as what I would like to see. Fortunately I do not see any signs of infection at this time which is great news and overall I am extremely pleased in that regard. With that being said I do think that he needs to continue to elevate his leg although the edema is under great control with a compression wrap I think switching to a different dressing topically may be beneficial. My suggestion is to switch him over to a Iodoflex dressing. 09-28-2022 upon evaluation today patient appears to be doing well with regard to his heel ulcer. This is actually showing signs of improvement which is great news and overall I am extremely pleased with where we stand today. 10-06-2022 upon evaluation today patient actually appears to be making excellent progress at this point. Fortunately there does not appear to be any signs of active infection locally nor systemically which is great news and overall I am extremely pleased with where we stand. I do feel like he is actually making some pretty good progress here as we switch to the wrap along with the Iodoflex. 12/26; patient has a wound on the tip of his left heel. This is not a weightbearing surface. He is using a heel offloading boot and carefully offloading this at other times during the day. He is using Iodoflex and Zetuvitunder 3 layer compression 10-22-2022 upon evaluation today patient appears to be doing well currently in regard to his wound. He has been tolerating the dressing changes without complication. Fortunately there does not appear to be any signs of active infection locally nor systemically at this time. No fevers, chills, nausea, vomiting, or diarrhea. 08-30-2023 upon evaluation today patient appears to be doing well currently in regard to his heel ulcer which is showing signs of  good improvement. Fortunately I do not see any evidence of infection locally nor systemically which is great news and overall I am extremely pleased with where we stand. No fevers, chills, nausea, vomiting, or diarrhea. Unfortunately he does have a wound on the side of his fifth toe where I think this did rub on the shoe. 11-05-2022 upon evaluation today patient still still showing some signs of pressure at this point and there may have even been some fluid collection at this time. Fortunately I do not see any evidence of active infection locally nor systemically which is great news and overall I am extremely pleased with where we stand. No fevers, chills, nausea, vomiting, or diarrhea. 1/25; the patient's area on the dorsal aspect of the left fifth toe is healed. We have been using Iodoflex to the area on the left heel. The lateral wound is slightly down in length. 11-19-2022 upon evaluation today patient appears to be doing well currently in regard to his wound. He has been tolerating the dressing changes without complication. Fortunately there does not appear to be any signs of infection we been using Iodoflex up to this point. 11-26-2022 upon evaluation today patient appears to be doing poorly currently in regard to his wound. He has been tolerating the dressing changes with the Iodoflex without complication. With that being said he does not have any signs of active infection systemically though locally I do feel like there is  some evidence of infection here. 12-03-2022 upon evaluation today patient appears to be doing well currently in regard to his wound this is better with the Bactrim compared to last week still there ZYMIER, MATSUURA ([id number]) [phone].pdf Page 5 of 14 was some significant slough and biofilm buildup which is going require sharp debridement at this point. Fortunately I do not see any signs of active infection locally nor systemically which is great  news. 12-11-2022 upon evaluation today patient actually appears to be doing significantly better at this time in regard to his heel. I am actually very pleased with where things stand currently. There is no signs of active infection locally or systemically at this point. 12-17-2022 upon evaluation today patient appears to be doing better as far as the overall appearance of his wound. I am actually very pleased in that regard. Fortunately there does not appear to be any signs of active infection locally nor systemically at this time. I do feel like that the patient is showing signs of excellent improvement in general. Nonetheless he needs something better for compression I think he should go ahead and see about getting compression socks. 3/7; tip of the left heel measurements are smaller he has been using Hydrofera Blue 01-07-2023 upon evaluation today patient appears to be doing well currently in regard to his wound. This has not been debrided since I last saw him and it definitely shows that definitely needs some debridement today. Fortunately I do not see any evidence of active infection locally nor systemically which is great news. 01-14-2023 upon evaluation today patient appears to be doing well currently although he does have some erythema and warmth to the heel I feel like he may have developed an infection yet again. This is something that is been ongoing issue we have been trying to manage his foot best we could. With that being said I do think he may need to go back on the Bactrim this has done well for him in the past. 01-21-2023 upon evaluation today patient appears to be doing better in regard to his heel. He is doing very well with the antibiotics and that is made a big difference for him based on what I am seeing I am very pleased with where we stand today. He is obviously doing extremely well which is great news. 01-28-2023 upon evaluation today patient appears to be doing well currently in  regard to his wound. This is actually showing signs of excellent improvement I am actually very pleased with where we stand and I do believe that he is making good progress here. Fortunately I do not see any evidence of active infection locally nor systemically which is great news. No fevers, chills, nausea, vomiting, or diarrhea. 02-04-2023 upon evaluation today patient's wound is actually showed signs of excellent improvement I am actually very pleased with where we stand. I do not see any signs of active infection locally nor systemically which is great news and in general I do believe that we are moving in the right direction here. No fevers, chills, nausea, vomiting, or diarrhea. 02-11-2023 upon evaluation patient's wound actually showing signs of excellent improvement I am actually very pleased with where we stand I think that his pain is better there is no signs of infection we will continue to use the gentamicin and overall we are making excellent progress. 02-18-2023 upon evaluation today patient appears to be doing well currently in regard to his wound. He has been tolerating the dressing changes without  complication. Fortunately I do not see any signs of active infection locally nor systemically which is great news. 02-25-2023 upon evaluation today patient's heel actually showed signs of excellent improvement. I am actually very pleased with where we stand today and I feel like that he is making really good progress. Fortunately I do not see any signs of active infection which is good news. No fevers, chills, nausea, vomiting, or diarrhea. 03-04-2023 upon evaluation today patient appears to be doing pretty well currently in regard to his heel. He has been tolerating the dressing changes without complication. Fortunately there does not appear to be any signs of active infection locally nor systemically which is great news. No fevers, chills, nausea, vomiting, or diarrhea. 03-11-2023 upon evaluation  today patient appears to be doing poorly in regard to his heel ulcer. He tells me he has been having to sleep on his back to having trouble breathing therefore I think he has been having some issues here as far as the heel is concerned it looks like he has a deep tissue injury yet again at this point. Fortunately I do not see any signs of infection which is good news but at the same time I do feel like that he is doing worse than where he was previous. I do not see any signs of active infection systemically which is great news. 03-18-2023 upon evaluation today patient appears to be doing well currently in fact the heel is significantly better compared to last week. I am extremely pleased with where we are seeing I am hopeful that he will continue to show signs of improvement going forward. 03-25-2023 upon evaluation today patient appears to be doing well currently in regard to his wounds. He is actually showing signs of improvement on the heel and I am very pleased in that regard. Fortunately I do not see any signs of active infection at this time which is great news. No fevers, chills, nausea, vomiting, or diarrhea. 04-05-2023 upon evaluation today patient's wound is actually showing signs of good improvement. Actually very pleased with where things stand today. Fortunately there does not appear to be any signs of active infection locally or systemically which is great news. 04-19-2023 upon evaluation today patient's wound actually looks the best it has in quite some time. He did have a wrap on for 2 weeks which I think is caused a little bit of irritation going to the forefoot everything else looks to be doing well the wrap did slide down a little bit as well. Fortunately I do not see any signs of infection however I do think that in the future if he has any issues with not being able to get scheduled to see me he should definitely make an appointment at least for a nurse visit he is aware and in agreement  with plan going forward. 04-26-2023 upon evaluation today patient's wound is actually significantly smaller this is great news he is making good progress in the past really 5 or 6 visits we have been seeing improvement week by week I am very pleased in that regard. 05-03-2023 upon evaluation today patient appears to be doing excellent in regard to the wound on his heel. This is actually showing signs of improvement he continues to make good progress towards complete closure which is great news. 7/23; wound continues to do nicely. Smaller with healthy epithelialization. This is actually a reopening we had already healed this area once and we discussed this this visit. 05-18-2023 upon evaluation today  patient's wound actually showing signs of improvement. Fortunately I do not see any signs of active infection locally or systemically which is great news. No fevers, chills, nausea, vomiting, or diarrhea. 05-24-2023 upon evaluation today patient's wound on the heel appears to be doing quite well. Fortunately there does not appear to be any signs of active infection at this time which is great news. Fortunately I think that he is making excellent headway with the current treatment regimen. 06-01-2023 upon evaluation today patient appears to be doing excellent in regard to his wound on the heel this is actually showing signs of being smaller measuring better and looking like is doing quite well. Fortunately I do not see any signs of infection at this time. He does have a small wound on the leg which we can also need to address this looks like it was just a small blister that ruptured I think it should heal pretty quickly. 06-07-2023 upon evaluation today patient appears to be doing well currently in regard to his wound. He has been tolerating the dressing changes without complication. Fortunately there does not appear to be any signs of active infection locally or systemically at this time. No fevers, chills,  nausea, vomiting, or diarrhea. 8/26; wound near the tip of the left heel. He is a diabetic. We have been using Hydrofera Blue under Urgo K2 lite wrap he has been in his surgical shoe although he tells me it disintegrated and he came in on a regular running shoe today. 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 He is not wearing compression stocking [name], [name] ([id number]) [phone].pdf Page 6 of 14 06-22-2023 upon evaluation today patient appears to be doing excellent in regard to his wound on the heel which is actually significantly smaller compared to last week's evaluation. Fortunately I do not see any signs of active infection locally or systemically at this time. 06-28-2023 upon evaluation today patient appears to be doing well currently in regard to his heel which is actually showing signs of improvement. Fortunately I do not see any evidence of active infection at this time which is great news and in general I do believe that we are making headway towards complete closure which is good news. No fevers, chills, nausea, vomiting, or diarrhea. 07-05-2023 upon evaluation today patient appears to be doing well currently in regard to his heel ulcer. This is actually very close I think that being completely healed. This is great news and overall very pleased with where we stand today. 07-12-2023 upon evaluation today patient appears to be doing well currently in regard to his wound. He in fact appears to be very close to complete resolution I am very pleased that regard. However on the tip of his foot there appears to be some issue here with inflammation secondary to infection. I am very concerned about the infection at the site. 07-19-2023 upon evaluation today patient appears to actually be doing well in regard to his heel. Fortunately I do not see any signs of worsening overall. In general I think you are making headway towards closure. 07-26-2023  upon evaluation today patient appears to be doing well currently in regard to his wound. He has in fact completely healed and looks to be doing great. I still want to keep an eye on things due to the end of his foot being red although I think this may be secondary to the Tubigrip more than anything to be honest. Fortunately I  do not see any signs of active infection at this time which is great news. No fevers, chills, nausea, vomiting, or diarrhea. 08-02-2023 upon evaluation today patient appears to be doing great in regard to his heel ulcer. He has been tolerating the dressing changes without complication. Fortunately I do not see any signs of active infection locally or systemically at this time. 08-31-2023 upon evaluation today patient appears to be doing well currently in regard to his heel this is still closed and looks great unfortunately has a wound on his finger he tells me this is a blister that showed up he popped it with a needle and it is continued to give him trouble since that time. Fortunately I do not see any signs of active infection at this time which is great news. No fevers, chills, nausea, vomiting, or diarrhea. 09-09-2023 upon evaluation today patient appears to be doing well currently in regard to his heel he still needs to make sure to be put in the urea cream on this to soften up the callus but other than that seems to be doing quite well. Upon inspection patient's finger actually showing signs of improvement as well which and very pleased in regard to with that being said I do think that he needs to monitor for any signs of infection or worsening obviously in general. Electronic Signature(s) Signed: 09/09/2023 5:32:10 PM By: [name] Derry PA-C Entered By: [name] Derry on 09/09/2023 14:32:10 -------------------------------------------------------------------------------- Physical Exam Details Patient Name: Date of Service: [name], [name] [name] J. 09/09/2023 1:45 PM Medical Record  Number: [id number] Patient Account Number: [id number] Date of Birth/Sex: Treating RN: 04-11-1946 (77 y.o. [name] Primary Care Provider: [name] Other Clinician: Betha Loa Referring Provider: Treating Provider/Extender: Hermine Messick Weeks in Treatment: 52 Constitutional Well-nourished and well-hydrated in no acute distress. Respiratory normal breathing without difficulty. Psychiatric this patient is able to make decisions and demonstrates good insight into disease process. Alert and Oriented x 3. pleasant and cooperative. Notes Patient's wound bed actually showed signs of good granulation epithelization at this point. Fortunately I do not see any evidence of worsening overall and I believe that the patient is making good headway here towards closure. Electronic Signature(s) Signed: 09/09/2023 5:32:25 PM By: [name] Derry PA-C Entered By: [name] Derry on 09/09/2023 14:32:24 [name] ([id number]) [id number] Page 7 of 14 -------------------------------------------------------------------------------- Physician Orders Details Patient Name: Date of Service: [name], [name] Guhan [facility] J. 09/09/2023 1:45 PM Medical Record Number: [id number] Patient Account Number: [id number] Date of Birth/Sex: Treating RN: [birth year] (77 y.o. [name] Primary Care Provider: [name] Other Clinician: Betha Loa Referring Provider: Treating Provider/Extender: [name] in Treatment: 34 The following information was scribed by: Betha Loa The information was scribed for: [name] Derry Verbal / Phone Orders: No Diagnosis Coding ICD-10 Coding Code Description E11.622 Type 2 diabetes mellitus with other skin ulcer S60.410A Abrasion of right index finger, initial encounter E11.42 Type 2 diabetes mellitus with diabetic polyneuropathy Follow-up Appointments Return Appointment in 1 week. Nurse Visit  as needed [employer] wounds with antibacterial soap and water. Additional Orders / Instructions Other: - apply Urea cream to heels at night, use heel cup for protection Wound Treatment Wound #16 - Hand - 2nd Digit Wound Laterality: Right Cleanser: Normal Saline 1 x Per Day/30 Days Discharge Instructions: Wash your hands with soap and water. Remove old dressing, discard into plastic bag and place into trash. Cleanse the wound with Normal Saline prior to  applying a clean dressing using gauze sponges, not tissues or cotton balls. Do not scrub or use excessive force. Pat dry using gauze sponges, not tissue or cotton balls. Cleanser: Soap and Water 1 x Per Day/30 Days Discharge Instructions: Gently cleanse wound with antibacterial soap, rinse and pat dry prior to dressing wounds Prim Dressing: Xeroform-HBD 2x2 (in/in) 1 x Per Day/30 Days ary Discharge Instructions: Apply Xeroform-HBD 2x2 (in/in) as directed Secondary Dressing: Conforming Guaze Roll-Small 1 x Per Day/30 Days Discharge Instructions: Apply Conforming Stretch Guaze Bandage as directed Secondary Dressing: Gauze 1 x Per Day/30 Days Discharge Instructions: As directed: dry, moistened with saline or moistened with Dakins Solution Secured With: [employer], Latex-free, Size 5, Small-Head / Shoulder / Thigh 1 x Per Day/30 Days Discharge Instructions: size 3 Electronic Signature(s) Signed: 09/09/2023 4:53:18 PM By: Betha Loa Signed: 09/09/2023 6:01:43 PM By: [name] Derry PA-C Entered By: Betha Loa on 09/09/2023 11:40:09 [name] ([id number]) [id number] Page 8 of 14 -------------------------------------------------------------------------------- Problem List Details Patient Name: Date of Service: [name], [name] [name] J. 09/09/2023 1:45 PM Medical Record Number: [id number] Patient Account Number: [id number] Date of Birth/Sex: Treating RN: [birth year] (77 y.o. [name] Primary Care Provider: [name] Other Clinician: Betha Loa Referring Provider: Treating Provider/Extender: Hermine Messick Weeks in Treatment: 62 Active Problems ICD-10 Encounter Code Description Active Date MDM Diagnosis E11.622 Type 2 diabetes mellitus with other skin ulcer 06/19/2022 No Yes S60.410A Abrasion of right index finger, initial encounter 08/31/2023 No Yes E11.42 Type 2 diabetes mellitus with diabetic polyneuropathy 06/19/2022 No Yes Inactive Problems Resolved Problems ICD-10 Code Description Active Date Resolved Date L97.522 Non-pressure chronic ulcer of other part of left foot with fat layer exposed 06/19/2022 06/19/2022 Electronic Signature(s) Signed: 09/09/2023 1:41:11 PM By: [name] Derry PA-C Entered By: [name] Derry on 09/09/2023 10:41:11 -------------------------------------------------------------------------------- Progress Note Details Patient Name: Date of Service: [name], [name] [name] J. 09/09/2023 1:45 PM Medical Record Number: [id number] Patient Account Number: [id number] Date of Birth/Sex: Treating RN: 1946-02-24 (77 y.o. [name] Primary Care Provider: [name] Other Clinician: Betha Loa Referring Provider: Treating Provider/Extender: [name], [name] ([id number]) [id number] Page 9 of 14 Weeks in Treatment: 63 Subjective Chief Complaint Information obtained from Patient Right index finger ulcer History of Present Illness (HPI) 09/24/2020 on evaluation today patient presents today for a heel ulcer that he tells me has been present for about 2 years. He has been seeing podiatry and they have been attempting to manage this including what sounds to be a total contact cast, Unna boot, and just standard dressings otherwise as well. Most recently has been using triple antibiotic ointment. With that being said unfortunately despite everything he  really has not had any significant improvement. He tells me that he cannot even really remember exactly how this began but he presumed it may have rubbed on his shoes or something of that nature. 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 He tells me that this does seem to help. Fortunately there is no signs of active infection at this time. His most recent hemoglobin A1c was 8.1 though he knows this was this year he cannot tell me the exact time. His fluid  pills currently to help with some of the lower extremity edema although he does obviously have signs of venous stasis/lymphedema. Currently there is no evidence of active infection. No fevers, chills, nausea, vomiting, or diarrhea. Patient has had fairly recent ABIs which were performed on 07/19/2020 and revealed that he has normal findings in both the ankle and toe locations bilaterally. His ABI on the right was 1.09 on the left was 1.08 with a TBI on the right of 0.88 and on the left of 0.94. Triphasic flow was noted throughout. 10/08/2020 on evaluation today patient appears to be doing pretty well in regard to his left heel currently in fact this is doing a great job and seems to be healing quite nicely. Unfortunately on his right leg he had a pile of wood that actually fell on him injuring his right leg this is somewhat erythematous has me concerned little bit about cellulitis though there is not really a good area to culture at this point. 10/24/2020 upon evaluation today patient appears to be doing well with regard to his heel ulcer. He is showing signs of improvement which is great news. His right leg is completely healed. Overall I feel like he is doing excellent and  there is no signs of infection. 11/07/2020 upon evaluation today patient appears to be doing well with regard to his heel ulcer. He tells me that last week when he was unable to come in his wife actually thought that the wound was very close to closing if not closed. Then it began to "reopen again". I really feel like what may have happened as the collagen may have dried over the wound bed and that because that misunderstanding with thinking that the wound was healing. With that being said I did not see it last week I do not know that for certain. Either way I feel like he is doing great today I see no signs of infection at this point. 11/26/2020 upon inspection today patient appears to be doing decently well in regard to his heel ulcer. He has been tolerating the dressing changes without complication. Fortunately there is no sign of active infection at this time. No fevers, chills, nausea, vomiting, or diarrhea. 12/10/2020 upon evaluation today patient appears to be doing fairly well in regard to the wound on his heel as well as what appears to be a new wound of the left first metatarsal head plantar aspect. This seems to be an area that was callus that has split as the patient tells me has been trying to walk on his toes more has probably where this came from. With that being said there does not appear to be signs of active infection which is great news. 12/17/2020 upon evaluation today patient actually appears to be making good progress currently. Fortunately there is no evidence of active infection at this time. Overall I feel like he is very close to complete closure. 12/26/2020 upon evaluation today patient appears to be doing excellent in regard to his wounds. In fact I am not certain that these are not even completely healed on initial inspection. Overall I am very pleased with where things stand today. Good news is that they are healed he is actually get ready to go out of town and that will be  helpful as well as he will be a full part of the time. Readmission: 02/04/2021 upon evaluation today patient appears to be doing well at this point in regard to his left heel that I previously saw  him for. Unfortunately he is having issues with his right lower extremity. He has significant wounds at this point he also has erythema noted there is definite signs of cellulitis which is unfortunate. With that being said I think we do need to address this sooner rather than later. The good news is he did have a nice trip to the beach. He tells me that he had no issues during that time. 4/27; patient on Bactrim. Culture showed methicillin sensitive staph aureus therefore the Bactrim should be effective. 2 small areas on the right leg are healed the area on the mid aspect of the tibia almost 100% covered in a very adherent necrotic debris. We have been using silver alginate 02/20/2021 upon evaluation today patient appears to be doing well with regard to his wound. He is showing signs of improvement which is great news overall very pleased with where things stand today. No fevers, chills, nausea, vomiting, or diarrhea. 02/27/2021 upon evaluation today patient appears to be doing well with regard to his leg ulcer. He is tolerating the dressing changes and overall appears to be doing quite excellent. I am extremely pleased with where things stand and overall I think patient is making great progress. There is no sign of active infection at this time which is also great news. 03/06/2021 upon evaluation today patient appears to be doing excellent in regard to his wounds. In fact he appears to be completely healed today based on what I am seeing. This is excellent news and overall I am extremely pleased with where he stands. Overall the patient is happy to hear this as well this has been quite sometime coming. Readmission: 04/01/2021 patient unfortunately returns for readmission today. He tells me that he has been  wearing his compression socks on the right leg daily and he does not really know what is going on and why his legs are doing what they are doing. We did therefore go ahead and probe deeper into exactly what has been going on with him today. Subsequently the patient tells me that when he gets up and what we would call "first thing in the morning" are really 2 different things". He does not tend to sleep well so he tells me that he will often wake up at 3:00 in the morning. He will then potentially going into the living room to get in his chair where he may read a book for a little while and then potentially fall asleep back in his chair. He then subsequently wake up around 5:00 or so and then get up and in his words "putter around". This often will end with him proceeding at some point around 8 AM or 9 AM to putting on his compression socks. With that being said this means that anywhere from the 3:00 in the morning till roughly around 9:00 in the morning he has no compression on yet he is sitting in his recliner, walking around and up and about, and this is at least 5 to 6 hours of noncompressed time. That may be our issue here but I did not realize until we discussed this further today. Fortunately there does not appear to be any signs of active infection at this time which is great news. With that being said he has multiple wounds of the bilateral lower extremities. 04/10/2021 upon evaluation today patient appears to be doing well with regard to his legs for the most part. He does look like he had some injury where his wrap slid down  nonetheless he did some "doctoring on them". I do feel like most of the areas honestly have cleared back up which is great news. There does not appear to be any signs of active infection at this time which is also great news. No fevers, chills, nausea, vomiting, or diarrhea. 04/18/2021 upon evaluation today patient appears to be doing well with regard to his wounds in fact he  appears to be completely healed which is great news. Fortunately there does not appear to be any signs of active infection which is great news. No fevers, chills, nausea, vomiting, or diarrhea. READMISSION 07/28/2021 [name] ([id number]) [id number] Page 10 of 14 This is a now 77 year old man we have had in this clinic for quite a bit of this year. 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 He wears compression stockings. 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 He comes in an old running shoes although he says he wears better shoes at home. His history is that he noticed a blister in the right posterior heel. This open. He has been applying Neosporin to it currently the wound measures 2 x 1.4 cm. 100% slough covered. Not really offloading this in any rigorous way. Past medical history is essentially unchanged he has paroxysmal atrial fibrillation on Coumadin type 2 diabetes with a recent hemoglobin A1c of 7 on metformin. ABI in our clinic was 0.98 on the right 08/05/2021 upon evaluation today patient appears to be doing well with regard to his heel ulcer. Fortunately there does not appear to be any signs of active infection at this time. Overall I been very pleased with where things seem to be currently as far as the wound healing is concerned. Again this is first a lot of seeing him Dr. Leanord Hawking readmitted him last week. Nonetheless I think we are definitely making some progress here. 08/11/2021 upon evaluation today patient's wound on the heel actually showing signs of excellent improvement. I am actually very pleased with where things stand currently. No fevers, chills, nausea, vomiting, or diarrhea. There does not appear to be any need for sharp debridement today either which is also great news. 08/25/2021 upon evaluation today patient appears to  be doing well with regard to his heel ulcer. This is actually looking significantly improved compared to last time I saw him. Fortunately there does not appear to be any evidence of active infection at this time. No fevers, chills, nausea, vomiting, or diarrhea. 09/01/2021 upon evaluation today patient appears to be doing decently well in regard to his wounds. Fortunately there does not appear to be any signs of active infection at this time which is great news and overall very pleased in that regard. I do not see any evidence of active infection systemically which is great news as well. No fevers, chills, nausea, vomiting, or diarrhea. I think that the heel is making excellent progress. 09/15/2021 upon evaluation today patient's heel unfortunately is significantly worse compared to what it was previous. I do believe that at this time he would benefit from switching back to something a little bit more offloading from the cushion shoe he has right now this is more of a heel protector what I really need is a Prevalon offloading boot which I think is good to do a lot better for him than just the small heel protector. 09/23/2021 upon evaluation today patient appears to be doing a little better in regards  to last week's visit. Overall I think that he is making good progress which is great news and there does not appear to be any evidence of active infection at this time. No fevers, chills, nausea, vomiting, or diarrhea. 09/30/2021 upon evaluation patient's heel is actually showing signs of good improvement which is great news. Fortunately there does not appear to be any evidence of active infection locally nor systemically at this point. No fevers, chills, nausea, vomiting, or diarrhea. 10/07/2021 upon evaluation today patient appears to be doing well currently in regard to his heel ulcer. He has been tolerating the dressing changes without complication. Fortunately I do not see any signs of active infection  at this time which is great news. No fevers, chills, nausea, vomiting, or diarrhea. 12/27; wound is measuring smaller. We have been using silver alginate 10/28/2021 upon evaluation today patient appears to be doing well currently in regard to his heel ulcer. I am actually very pleased with where things stand and I think he is making excellent progress. Fortunately I do not see any signs of active infection locally nor systemically at this point which is great news. Nonetheless I do believe that the patient would benefit from a new offloading shoe and has been using it Velcro is no longer functioning properly and he tells me that he almost tripped and fell because of that today. Obviously I do not want him following that would be very bad. 11/11/2021 upon evaluation today patient appears to be doing excellent in regard to his wound. In fact this appears to be completely healed based on what I see currently. I do not see any signs of anything open or draining and I did double check just by clearing away some of the callus around the edges of the wound to ensure that there was nothing still open or hiding underneath. Once this was cleared away it appeared that the patient was doing significantly better at this time which is great news and there was nothing actually open. Readmission: 06-19-2022 upon evaluation today patient appears to be doing well currently in regard to his heel ulcer all things considered. He unfortunately is having a bit of breakdown in general as far as the heel is concerned not nearly as bad as what we noted last time he was here in the clinic. The good news is I do think that this should hopefully heal much more rapidly than what we noted last time. His past medical history has not changed he is on Coumadin. 07-03-2022 upon evaluation today patient appears to be doing better in regard to his heel. We will start to see some improvement here which is good news. Fortunately I do not see  any evidence of infection locally or systemically at this time which is great news. No fevers, chills, nausea, vomiting, or diarrhea. 07-10-2022 upon evaluation today patient appears to be doing well currently in regard to his wound from the callus standpoint around the edges. Unfortunately from an actual wound bed standpoint he has some deep tissue injury noted at this point. Again I am not sure exactly what is going on that is causing this pressure to the region but he is definitely gotten pressure that is occurring and causing this to not heal as effectively as what we would like to see. I discussed with him today that he may need to at night when he sleeping use a pillow up underneath his calf in order to prevent the heel from touching the bed at all.  I also think that it may be beneficial for him to monitor throughout his day and make sure there is no other time when he is getting the pressure to the heel. 07-23-2022 upon evaluation today patient appears to be doing poorly currently in regard to his heel ulcer. He has been tolerating the dressing changes without complication. Unfortunately I feel like he may be infected based on what I am seeing. 07-28-2022 I did review patient's culture results which showed positive for Proteus as well as Staphylococcus both of which would be treated with the Bactrim DS that I gave him at the last visit. This is good news and hopefully means that we should be able to apply the cast today as long as the wound itself does not appear to be doing poorly. Patient's wound bed actually showed signs of doing quite well and I am very pleased with where things stand and I do not see any signs of worsening overall which is great news. 08-04-2022 upon evaluation today patient appears to be doing well currently in regard to his heel ulcer. I am actually very pleased with where things stand and I do think this is looking significantly better. With regard to his right shin that is  also showing signs of improvement which is great news. 08-10-2022 upon evaluation today patient's wound on the heel actually appears to be doing significantly better. In regard to the right anterior shin this is showing signs of healing it might even be completely healed but I still get a monitor I cannot get anything to fill away but also could not see where there was anything actually draining at this point. I am tending to think healed but I want a monitor 1 more week before I call it for sure. 08-17-2022 upon evaluation today patient appears to be doing well currently in regard to his heel. Fortunately he is tolerating the dressing changes without complication. I do not see any signs of infection and overall I think he is making good progress here. We did get approval for the Apligraf but I think he had a $295 co-pay that would have to be paid per application. With that being said as good as he is doing right now I do not think that is necessary but is still an option depending on how things progress if we need to speed this up quite a bit. 08/24/2022; this patient has a wound on his left heel in the setting of type 2 diabetes. We have been using Prisma. He has a heel offloading boot 08-31-2022 upon evaluation today patient appears to be doing poorly in regard to his heel ulcer which is still showing signs of bruising I am just not as happy as I was when I saw him 2 weeks ago with this. He saw Dr. Leanord Hawking last week. Also did not look good at that point apparently. Nonetheless I think that the patient is still continuing to have some counterpressure getting to the wound bed unfortunately. [name], [name] ([id number]) [id number] Page 11 of 14 09-07-2022 upon evaluation today patient appears to be doing well currently in regard to his heel ulcer. He has been tolerating the dressing changes without complication. Fortunately there does not appear to be any signs of active  infection locally or systemically at this time. Fortunately I do not see any evidence of active infection at this time. 09-15-2022 upon evaluation today patient appears to be doing well currently in regard to his legs which in fact appear to  be completely healed this is great news. With that being said his heel does have some erythema and warmth as well as drainage I am concerned about cellulitis at this location. 09-21-2022 upon evaluation today patient's wound is actually showing signs of being a little bit smaller but still we are not doing nearly as well as what I would like to see. Fortunately I do not see any signs of infection at this time which is great news and overall I am extremely pleased in that regard. With that being said I do think that he needs to continue to elevate his leg although the edema is under great control with a compression wrap I think switching to a different dressing topically may be beneficial. My suggestion is to switch him over to a Iodoflex dressing. 09-28-2022 upon evaluation today patient appears to be doing well with regard to his heel ulcer. This is actually showing signs of improvement which is great news and overall I am extremely pleased with where we stand today. 10-06-2022 upon evaluation today patient actually appears to be making excellent progress at this point. Fortunately there does not appear to be any signs of active infection locally nor systemically which is great news and overall I am extremely pleased with where we stand. I do feel like he is actually making some pretty good progress here as we switch to the wrap along with the Iodoflex. 12/26; patient has a wound on the tip of his left heel. This is not a weightbearing surface. He is using a heel offloading boot and carefully offloading this at other times during the day. He is using Iodoflex and Zetuvitunder 3 layer compression 10-22-2022 upon evaluation today patient appears to be doing well  currently in regard to his wound. He has been tolerating the dressing changes without complication. Fortunately there does not appear to be any signs of active infection locally nor systemically at this time. No fevers, chills, nausea, vomiting, or diarrhea. 08-30-2023 upon evaluation today patient appears to be doing well currently in regard to his heel ulcer which is showing signs of good improvement. Fortunately I do not see any evidence of infection locally nor systemically which is great news and overall I am extremely pleased with where we stand. No fevers, chills, nausea, vomiting, or diarrhea. Unfortunately he does have a wound on the side of his fifth toe where I think this did rub on the shoe. 11-05-2022 upon evaluation today patient still still showing some signs of pressure at this point and there may have even been some fluid collection at this time. Fortunately I do not see any evidence of active infection locally nor systemically which is great news and overall I am extremely pleased with where we stand. No fevers, chills, nausea, vomiting, or diarrhea. 1/25; the patient's area on the dorsal aspect of the left fifth toe is healed. We have been using Iodoflex to the area on the left heel. The lateral wound is slightly down in length. 11-19-2022 upon evaluation today patient appears to be doing well currently in regard to his wound. He has been tolerating the dressing changes without complication. Fortunately there does not appear to be any signs of infection we been using Iodoflex up to this point. 11-26-2022 upon evaluation today patient appears to be doing poorly currently in regard to his wound. He has been tolerating the dressing changes with the Iodoflex without complication. With that being said he does not have any signs of active infection systemically though locally  I do feel like there is some evidence of infection here. 12-03-2022 upon evaluation today patient appears to be doing  well currently in regard to his wound this is better with the Bactrim compared to last week still there was some significant slough and biofilm buildup which is going require sharp debridement at this point. Fortunately I do not see any signs of active infection locally nor systemically which is great news. 12-11-2022 upon evaluation today patient actually appears to be doing significantly better at this time in regard to his heel. I am actually very pleased with where things stand currently. There is no signs of active infection locally or systemically at this point. 12-17-2022 upon evaluation today patient appears to be doing better as far as the overall appearance of his wound. I am actually very pleased in that regard. Fortunately there does not appear to be any signs of active infection locally nor systemically at this time. I do feel like that the patient is showing signs of excellent improvement in general. Nonetheless he needs something better for compression I think he should go ahead and see about getting compression socks. 3/7; tip of the left heel measurements are smaller he has been using Hydrofera Blue 01-07-2023 upon evaluation today patient appears to be doing well currently in regard to his wound. This has not been debrided since I last saw him and it definitely shows that definitely needs some debridement today. Fortunately I do not see any evidence of active infection locally nor systemically which is great news. 01-14-2023 upon evaluation today patient appears to be doing well currently although he does have some erythema and warmth to the heel I feel like he may have developed an infection yet again. This is something that is been ongoing issue we have been trying to manage his foot best we could. With that being said I do think he may need to go back on the Bactrim this has done well for him in the past. 01-21-2023 upon evaluation today patient appears to be doing better in regard to  his heel. He is doing very well with the antibiotics and that is made a big difference for him based on what I am seeing I am very pleased with where we stand today. He is obviously doing extremely well which is great news. 01-28-2023 upon evaluation today patient appears to be doing well currently in regard to his wound. This is actually showing signs of excellent improvement I am actually very pleased with where we stand and I do believe that he is making good progress here. Fortunately I do not see any evidence of active infection locally nor systemically which is great news. No fevers, chills, nausea, vomiting, or diarrhea. 02-04-2023 upon evaluation today patient's wound is actually showed signs of excellent improvement I am actually very pleased with where we stand. I do not see any signs of active infection locally nor systemically which is great news and in general I do believe that we are moving in the right direction here. No fevers, chills, nausea, vomiting, or diarrhea. 02-11-2023 upon evaluation patient's wound actually showing signs of excellent improvement I am actually very pleased with where we stand I think that his pain is better there is no signs of infection we will continue to use the gentamicin and overall we are making excellent progress. 02-18-2023 upon evaluation today patient appears to be doing well currently in regard to his wound. He has been tolerating the dressing changes without complication. Fortunately I  do not see any signs of active infection locally nor systemically which is great news. 02-25-2023 upon evaluation today patient's heel actually showed signs of excellent improvement. I am actually very pleased with where we stand today and I feel like that he is making really good progress. Fortunately I do not see any signs of active infection which is good news. No fevers, chills, nausea, vomiting, or diarrhea. 03-04-2023 upon evaluation today patient appears to be doing  pretty well currently in regard to his heel. He has been tolerating the dressing changes without complication. Fortunately there does not appear to be any signs of active infection locally nor systemically which is great news. No fevers, chills, nausea, vomiting, or diarrhea. [name], [name] ([id number]) [id number] Page 12 of 14 03-11-2023 upon evaluation today patient appears to be doing poorly in regard to his heel ulcer. He tells me he has been having to sleep on his back to having trouble breathing therefore I think he has been having some issues here as far as the heel is concerned it looks like he has a deep tissue injury yet again at this point. Fortunately I do not see any signs of infection which is good news but at the same time I do feel like that he is doing worse than where he was previous. I do not see any signs of active infection systemically which is great news. 03-18-2023 upon evaluation today patient appears to be doing well currently in fact the heel is significantly better compared to last week. I am extremely pleased with where we are seeing I am hopeful that he will continue to show signs of improvement going forward. 03-25-2023 upon evaluation today patient appears to be doing well currently in regard to his wounds. He is actually showing signs of improvement on the heel and I am very pleased in that regard. Fortunately I do not see any signs of active infection at this time which is great news. No fevers, chills, nausea, vomiting, or diarrhea. 04-05-2023 upon evaluation today patient's wound is actually showing signs of good improvement. Actually very pleased with where things stand today. Fortunately there does not appear to be any signs of active infection locally or systemically which is great news. 04-19-2023 upon evaluation today patient's wound actually looks the best it has in quite some time. He did have a wrap on for 2 weeks which I think is  caused a little bit of irritation going to the forefoot everything else looks to be doing well the wrap did slide down a little bit as well. Fortunately I do not see any signs of infection however I do think that in the future if he has any issues with not being able to get scheduled to see me he should definitely make an appointment at least for a nurse visit he is aware and in agreement with plan going forward. 04-26-2023 upon evaluation today patient's wound is actually significantly smaller this is great news he is making good progress in the past really 5 or 6 visits we have been seeing improvement week by week I am very pleased in that regard. 05-03-2023 upon evaluation today patient appears to be doing excellent in regard to the wound on his heel. This is actually showing signs of improvement he continues to make good progress towards complete closure which is great news. 7/23; wound continues to do nicely. Smaller with healthy epithelialization. This is actually a reopening we had already healed this area once and we discussed this  this visit. 05-18-2023 upon evaluation today patient's wound actually showing signs of improvement. Fortunately I do not see any signs of active infection locally or systemically which is great news. No fevers, chills, nausea, vomiting, or diarrhea. 05-24-2023 upon evaluation today patient's wound on the heel appears to be doing quite well. Fortunately there does not appear to be any signs of active infection at this time which is great news. Fortunately I think that he is making excellent headway with the current treatment regimen. 06-01-2023 upon evaluation today patient appears to be doing excellent in regard to his wound on the heel this is actually showing signs of being smaller measuring better and looking like is doing quite well. Fortunately I do not see any signs of infection at this time. He does have a small wound on the leg which we can also need to address  this looks like it was just a small blister that ruptured I think it should heal pretty quickly. 06-07-2023 upon evaluation today patient appears to be doing well currently in regard to his wound. He has been tolerating the dressing changes without complication. Fortunately there does not appear to be any signs of active infection locally or systemically at this time. No fevers, chills, nausea, vomiting, or diarrhea. 8/26; wound near the tip of the left heel. He is a diabetic. We have been using Hydrofera Blue under Urgo K2 lite wrap he has been in his surgical shoe although he tells me it disintegrated and he came in on a regular running shoe today. 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 He is not wearing compression stocking 06-22-2023 upon evaluation today patient appears to be doing excellent in regard to his wound on the heel which is actually significantly smaller compared to last week's evaluation. Fortunately I do not see any signs of active infection locally or systemically at this time. 06-28-2023 upon evaluation today patient appears to be doing well currently in regard to his heel which is actually showing signs of improvement. Fortunately I do not see any evidence of active infection at this time which is great news and in general I do believe that we are making headway towards complete closure which is good news. No fevers, chills, nausea, vomiting, or diarrhea. 07-05-2023 upon evaluation today patient appears to be doing well currently in regard to his heel ulcer. This is actually very close I think that being completely healed. This is great news and overall very pleased with where we stand today. 07-12-2023 upon evaluation today patient appears to be doing well currently in regard to his wound. He in fact appears to be very close to complete resolution I am very pleased that regard. However on the tip of his foot there appears to be some issue here with  inflammation secondary to infection. I am very concerned about the infection at the site. 07-19-2023 upon evaluation today patient appears to actually be doing well in regard to his heel. Fortunately I do not see any signs of worsening overall. In general I think you are making headway towards closure. 07-26-2023 upon evaluation today patient appears to be doing well currently in regard to his wound. He has in fact completely healed and looks to be doing great. I still want to keep an eye on things due to the end of his foot being red although I think this may be secondary to the Tubigrip more than anything to be honest. Fortunately I do not see  any signs of active infection at this time which is great news. No fevers, chills, nausea, vomiting, or diarrhea. 08-02-2023 upon evaluation today patient appears to be doing great in regard to his heel ulcer. He has been tolerating the dressing changes without complication. Fortunately I do not see any signs of active infection locally or systemically at this time. 08-31-2023 upon evaluation today patient appears to be doing well currently in regard to his heel this is still closed and looks great unfortunately has a wound on his finger he tells me this is a blister that showed up he popped it with a needle and it is continued to give him trouble since that time. Fortunately I do not see any signs of active infection at this time which is great news. No fevers, chills, nausea, vomiting, or diarrhea. 09-09-2023 upon evaluation today patient appears to be doing well currently in regard to his heel he still needs to make sure to be put in the urea cream on this to soften up the callus but other than that seems to be doing quite well. Upon inspection patient's finger actually showing signs of improvement as well which and very pleased in regard to with that being said I do think that he needs to monitor for any signs of infection or worsening obviously in  general. Objective Constitutional Well-nourished and well-hydrated in no acute distress. [name], [name] ([id number]) [id number] Page 13 of 14 Vitals Time Taken: 2:13 PM, Height: 70 in, Weight: 265 lbs, BMI: 38, Temperature: 99.2 F, Pulse: 78 bpm, Respiratory Rate: 18 breaths/min, Blood Pressure: 144/56 mmHg. Respiratory normal breathing without difficulty. Psychiatric this patient is able to make decisions and demonstrates good insight into disease process. Alert and Oriented x 3. pleasant and cooperative. General Notes: Patient's wound bed actually showed signs of good granulation epithelization at this point. Fortunately I do not see any evidence of worsening overall and I believe that the patient is making good headway here towards closure. Integumentary (Hair, Skin) Wound #16 status is Open. Original cause of wound was Blister. The date acquired was: 08/18/2023. The wound has been in treatment 1 weeks. The wound is located on the Right Hand - 2nd Digit. The wound measures 1.5cm length x 0.9cm width x 0.1cm depth; 1.06cm^2 area and 0.106cm^3 volume. There is Fat Layer (Subcutaneous Tissue) exposed. There is a medium amount of serosanguineous drainage noted. There is medium (34-66%) red granulation within the wound bed. There is a medium (34-66%) amount of necrotic tissue within the wound bed including Eschar and Adherent Slough. Assessment Active Problems ICD-10 Type 2 diabetes mellitus with other skin ulcer Abrasion of right index finger, initial encounter Type 2 diabetes mellitus with diabetic polyneuropathy Procedures Wound #16 Pre-procedure diagnosis of Wound #16 is an Abrasion located on the Right Hand - 2nd Digit . There was a Selective/Open Wound Non-Viable Tissue Debridement with a total area of 1.06 sq cm performed by [name] Derry, PA-C. With the following instrument(s): Curette to remove Viable and Non-Viable tissue/material.. A time out was  conducted at 14:38, prior to the start of the procedure. A Minimum amount of bleeding was controlled with Pressure. The procedure was tolerated well. Post Debridement Measurements: 1.5cm length x 0.9cm width x 0.1cm depth; 0.106cm^3 volume. Character of Wound/Ulcer Post Debridement is stable. Post procedure Diagnosis Wound #16: Same as Pre-Procedure Plan Follow-up Appointments: Return Appointment in 1 week. Nurse Visit as needed Bathing/ Shower/ Hygiene: Wash wounds with antibacterial soap and water. Additional Orders / Instructions: Other: -  apply Urea cream to heels at night, use heel cup for protection WOUND #16: - Hand - 2nd Digit Wound Laterality: Right Cleanser: Normal Saline 1 x Per Day/30 Days Discharge Instructions: Wash your hands with soap and water. Remove old dressing, discard into plastic bag and place into trash. Cleanse the wound with Normal Saline prior to applying a clean dressing using gauze sponges, not tissues or cotton balls. Do not scrub or use excessive force. Pat dry using gauze sponges, not tissue or cotton balls. Cleanser: Soap and Water 1 x Per Day/30 Days Discharge Instructions: Gently cleanse wound with antibacterial soap, rinse and pat dry prior to dressing wounds Prim Dressing: Xeroform-HBD 2x2 (in/in) 1 x Per Day/30 Days ary Discharge Instructions: Apply Xeroform-HBD 2x2 (in/in) as directed Secondary Dressing: Conforming Guaze Roll-Small 1 x Per Day/30 Days Discharge Instructions: Apply Conforming Stretch Guaze Bandage as directed Secondary Dressing: Gauze 1 x Per Day/30 Days Discharge Instructions: As directed: dry, moistened with saline or moistened with Dakins Solution Secured With: [employer] Food Dressing, Latex-free, Size 5, Small-Head / Shoulder / Thigh 1 x Per Day/30 Days Discharge Instructions: size 3 1. I am going to recommend that the patient should continue to monitor for any signs of infection or worsening. 2. I am going to recommend that right  now the patient should continue with the Xeroform gauze dressing which I think is probably the best way to go. 3. And also can recommend we should continue to monitor for signs of infection if anything changes he should let me know. We will see patient back for reevaluation in 1 week here in the clinic. If anything worsens or changes patient will contact our office for additional recommendations. [name], [name] ([id number]) [id number] Page 14 of 14 Electronic Signature(s) Signed: 09/09/2023 5:33:46 PM By: [name] Derry PA-C Entered By: [name] Derry on 09/09/2023 14:33:46 -------------------------------------------------------------------------------- SuperBill Details Patient Name: Date of Service: [name], [name] [name] J. 09/09/2023 Medical Record Number: [id number] Patient Account Number: [id number] Date of Birth/Sex: Treating RN: Nov 09, 1945 (77 y.o. [name] Primary Care Provider: [name] Other Clinician: Betha Loa Referring Provider: Treating Provider/Extender: Hermine Messick Weeks in Treatment: 63 Diagnosis Coding ICD-10 Codes Code Description E11.622 Type 2 diabetes mellitus with other skin ulcer S60.410A Abrasion of right index finger, initial encounter E11.42 Type 2 diabetes mellitus with diabetic polyneuropathy Facility Procedures : CPT4 Code: [id number] Description: 97597 - DEBRIDE WOUND 1ST 20 SQ CM OR < ICD-10 Diagnosis Description S60.410A Abrasion of right index finger, initial encounter Modifier: Quantity: 1 Physician Procedures : CPT4 Code Description Modifier [id number] - WC PHYS DEBR WO ANESTH 20 SQ CM ICD-10 Diagnosis Description S60.410A Abrasion of right index finger, initial encounter Quantity: 1 Electronic Signature(s) Signed: 09/09/2023 5:33:58 PM By: [name] Derry PA-C Entered By: [name] Derry on 09/09/2023 14:33:58

## 2023-09-10 DIAGNOSIS — E538 Deficiency of other specified B group vitamins: Secondary | ICD-10-CM | POA: Diagnosis not present

## 2023-09-20 ENCOUNTER — Encounter: Payer: Medicare HMO | Attending: Physician Assistant | Admitting: Physician Assistant

## 2023-09-20 DIAGNOSIS — S60511A Abrasion of right hand, initial encounter: Secondary | ICD-10-CM | POA: Diagnosis not present

## 2023-09-20 DIAGNOSIS — Z7984 Long term (current) use of oral hypoglycemic drugs: Secondary | ICD-10-CM | POA: Diagnosis not present

## 2023-09-20 DIAGNOSIS — Z79891 Long term (current) use of opiate analgesic: Secondary | ICD-10-CM | POA: Diagnosis not present

## 2023-09-20 DIAGNOSIS — E1142 Type 2 diabetes mellitus with diabetic polyneuropathy: Secondary | ICD-10-CM | POA: Diagnosis not present

## 2023-09-20 DIAGNOSIS — Z7901 Long term (current) use of anticoagulants: Secondary | ICD-10-CM | POA: Diagnosis not present

## 2023-09-20 DIAGNOSIS — I480 Paroxysmal atrial fibrillation: Secondary | ICD-10-CM | POA: Diagnosis not present

## 2023-09-20 DIAGNOSIS — Z952 Presence of prosthetic heart valve: Secondary | ICD-10-CM | POA: Diagnosis not present

## 2023-09-20 DIAGNOSIS — L98499 Non-pressure chronic ulcer of skin of other sites with unspecified severity: Secondary | ICD-10-CM | POA: Insufficient documentation

## 2023-09-20 DIAGNOSIS — E11622 Type 2 diabetes mellitus with other skin ulcer: Secondary | ICD-10-CM | POA: Insufficient documentation

## 2023-09-20 DIAGNOSIS — Z79899 Other long term (current) drug therapy: Secondary | ICD-10-CM | POA: Insufficient documentation

## 2023-09-20 NOTE — Progress Notes (Addendum)
[name], [name] ([id number]) [id number] Page 1 of 14 Visit Report for 09/20/2023 Chief Complaint Document Details Patient Name: Date of Service: [name], [name] 09/20/2023 1:45 PM Medical Record Number: [id number] Patient Account Number: [id number] Date of Birth/Sex: Treating RN: [birth year] (77 y.o. [name] Primary Care Provider: Aram [name] Other Clinician: Betha [name] Referring Provider: Treating Provider/Extender: [name] Weeks in Treatment: 63 Information Obtained from: Patient Chief Complaint Right index finger ulcer Electronic Signature(s) Signed: 09/20/2023 1:49:13 PM By: [name] Derry PA-C Entered By: [name] on 09/20/2023 13:49:13 -------------------------------------------------------------------------------- Debridement Details Patient Name: Date of Service: [name], [name] 09/20/2023 1:45 PM Medical Record Number: [id number] Patient Account Number: [id number] Date of Birth/Sex: Treating RN: [birth year] (77 y.o. [name] Primary Care Provider: Aram [name] Other Clinician: Betha [name] Referring Provider: Treating Provider/Extender: [name] in Treatment: 65 Debridement Performed for Assessment: Wound #16 Right Hand - 2nd Digit Performed By: Physician [name] Derry, PA-C The following information was scribed by: [name] The information was scribed for: [name] Debridement Type: Debridement Level of Consciousness (Pre-procedure): Awake and Alert Pre-procedure Verification/Time Out Yes - 14:11 Taken: Start Time: 14:11 Percent of Wound Bed Debrided: 100% T Area Debrided (cm): otal 0.24 Tissue and other material debrided: Viable, Non-Viable, Eschar, Slough, Subcutaneous, Skin: Dermis , Skin: Epidermis, Slough Level: Skin/Subcutaneous Tissue Debridement Description: Excisional Instrument: Curette Bleeding: Minimum Hemostasis Achieved:  Pressure Response to Treatment: Procedure was tolerated well Level of Consciousness [name], [name] ([id number]) [id number] Page 2 of 14 Level of Consciousness (Post- Awake and Alert procedure): Post Debridement Measurements of Total Wound Length: (cm) 1 Width: (cm) 0.3 Depth: (cm) 0.1 Volume: (cm) 0.024 Character of Wound/Ulcer Post Debridement: Stable Post Procedure Diagnosis Same as Pre-procedure Electronic Signature(s) Signed: 09/20/2023 3:27:26 PM By: [name] Signed: 09/20/2023 3:39:27 PM By: [name] Derry PA-C Signed: 09/20/2023 4:03:25 PM By: [name] Entered By: [name] on 09/20/2023 14:12:35 -------------------------------------------------------------------------------- HPI Details Patient Name: Date of Service: [name], [name] 09/20/2023 1:45 PM Medical Record Number: [id number] Patient Account Number: [id number] Date of Birth/Sex: Treating RN: 03/14/1946 (77 y.o. [name] Primary Care Provider: Aram [name] Other Clinician: Betha [name] Referring Provider: Treating Provider/Extender: [name] Weeks in Treatment: 67 History of Present Illness HPI Description: 09/24/2020 on evaluation today patient presents today for a heel ulcer that he tells me has been present for about 2 years. He has been seeing podiatry and they have been attempting to manage this including what sounds to be a total contact cast, Unna boot, and just standard dressings otherwise as well. Most recently has been using triple antibiotic ointment. With that being said unfortunately despite everything he really has not had any significant improvement. He tells me that he cannot even really remember exactly how this began but he presumed it may have rubbed on his shoes or something of that nature. 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 He tells me that this does seem to help. Fortunately there is no signs of active infection at this time. His most recent hemoglobin A1c was 8.1 though he knows this was this year he cannot tell me the exact time.  His fluid pills currently to help with some of the lower extremity edema although he does obviously have signs of venous stasis/lymphedema. Currently there is no evidence of active infection. No fevers, chills, nausea, vomiting, or diarrhea. Patient has had fairly recent ABIs which were performed on 07/19/2020 and revealed that he has normal findings in both the ankle and toe locations bilaterally. His ABI on the right was 1.09 on the left was 1.08 with a TBI on the right of 0.88 and on the left of 0.94. Triphasic flow was noted throughout. 10/08/2020 on evaluation today patient appears to be doing pretty well in regard to his left heel currently in fact this is doing a great job and seems to be healing quite nicely. Unfortunately on his right leg he had a pile of wood that actually fell on him injuring his right leg this is somewhat erythematous has me concerned little bit about cellulitis though there is not really a good area to culture at this point. 10/24/2020 upon evaluation today patient appears to be doing well with regard to his heel ulcer. He is showing signs of improvement which is great news. His right leg is completely healed. Overall I feel like he is doing excellent and there is no signs of infection. 11/07/2020 upon evaluation today patient appears to be doing well with regard to his heel ulcer. He tells me that last week when he was unable to come in his wife actually thought that the wound was very close to closing if not closed. Then it began to "reopen again". I really feel  like what may have happened as the collagen may have dried over the wound bed and that because that misunderstanding with thinking that the wound was healing. With that being said I did not see it last week I do not know that for certain. Either way I feel like he is doing great today I see no signs of infection at this point. 11/26/2020 upon inspection today patient appears to be doing decently well in regard to his heel ulcer. He has been tolerating the dressing changes without complication. Fortunately there is no sign of active infection at this time. No fevers, chills, nausea, vomiting, or diarrhea. 12/10/2020 upon evaluation today patient appears to be doing fairly well in regard to the wound on his heel as well as what appears to be a new wound of the left first metatarsal head plantar aspect. This seems to be an area that was callus that has split as the patient tells me has been trying to walk on his toes more has probably where this came from. With that being said there does not appear to be signs of active infection which is great news. 12/17/2020 upon evaluation today patient actually appears to be making good progress currently. Fortunately there is no evidence of active infection at this time. Overall I feel like he is very close to complete closure. 12/26/2020 upon evaluation today patient appears to be doing excellent in regard to his wounds. In fact I am not certain that these are not even completely [name], [name] ([id number]) [id number] Page 3 of 14 healed on initial inspection. Overall I am very pleased with where things stand today. Good news is that they are healed he is actually get ready to go out of town and that will be helpful as well as he will be a full part of the time. Readmission: 02/04/2021 upon evaluation today patient appears to be doing well at this point  in regard to his left heel that I previously saw him for. Unfortunately he is  having issues with his right lower extremity. He has significant wounds at this point he also has erythema noted there is definite signs of cellulitis which is unfortunate. With that being said I think we do need to address this sooner rather than later. The good news is he did have a nice trip to the beach. He tells me that he had no issues during that time. 4/27; patient on Bactrim. Culture showed methicillin sensitive staph aureus therefore the Bactrim should be effective. 2 small areas on the right leg are healed the area on the mid aspect of the tibia almost 100% covered in a very adherent necrotic debris. We have been using silver alginate 02/20/2021 upon evaluation today patient appears to be doing well with regard to his wound. He is showing signs of improvement which is great news overall very pleased with where things stand today. No fevers, chills, nausea, vomiting, or diarrhea. 02/27/2021 upon evaluation today patient appears to be doing well with regard to his leg ulcer. He is tolerating the dressing changes and overall appears to be doing quite excellent. I am extremely pleased with where things stand and overall I think patient is making great progress. There is no sign of active infection at this time which is also great news. 03/06/2021 upon evaluation today patient appears to be doing excellent in regard to his wounds. In fact he appears to be completely healed today based on what I am seeing. This is excellent news and overall I am extremely pleased with where he stands. Overall the patient is happy to hear this as well this has been quite sometime coming. Readmission: 04/01/2021 patient unfortunately returns for readmission today. He tells me that he has been wearing his compression socks on the right leg daily and he does not really know what is going on and why his legs are doing what they are doing. We did therefore go ahead and probe deeper into exactly what has been going on with  him today. Subsequently the patient tells me that when he gets up and what we would call "first thing in the morning" are really 2 different things". He does not tend to sleep well so he tells me that he will often wake up at 3:00 in the morning. He will then potentially going into the living room to get in his chair where he may read a book for a little while and then potentially fall asleep back in his chair. He then subsequently wake up around 5:00 or so and then get up and in his words "putter around". This often will end with him proceeding at some point around 8 AM or 9 AM to putting on his compression socks. With that being said this means that anywhere from the 3:00 in the morning till roughly around 9:00 in the morning he has no compression on yet he is sitting in his recliner, walking around and up and about, and this is at least 5 to 6 hours of noncompressed time. That may be our issue here but I did not realize until we discussed this further today. Fortunately there does not appear to be any signs of active infection at this time which is great news. With that being said he has multiple wounds of the bilateral lower extremities. 04/10/2021 upon evaluation today patient appears to be doing well with regard to his legs for the most part. He does  look like he had some injury where his wrap slid down nonetheless he did some "doctoring on them". I do feel like most of the areas honestly have cleared back up which is great news. There does not appear to be any signs of active infection at this time which is also great news. No fevers, chills, nausea, vomiting, or diarrhea. 04/18/2021 upon evaluation today patient appears to be doing well with regard to his wounds in fact he appears to be completely healed which is great news. Fortunately there does not appear to be any signs of active infection which is great news. No fevers, chills, nausea, vomiting, or diarrhea. READMISSION 07/28/2021 This is a  now 77 year old man we have had in this clinic for quite a bit of this year. 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 He wears compression stockings. 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 He comes in an old running shoes although he says he wears better shoes at home. His history is that he noticed a blister in the right posterior heel. This open. He has been applying Neosporin to it currently the wound measures 2 x 1.4 cm. 100% slough covered. Not really offloading this in any rigorous way. Past medical history is essentially unchanged he has paroxysmal atrial fibrillation on Coumadin type 2 diabetes with a recent hemoglobin A1c of 7 on metformin. ABI in our clinic was 0.98 on the right 08/05/2021 upon evaluation today patient appears to be doing well with regard to his heel ulcer. Fortunately there does not appear to be any signs of active infection at this time. Overall I been very pleased with where things seem to be currently as far as the wound healing is concerned. Again this is first a lot of seeing him Dr. Leanord Hawking readmitted him last week. Nonetheless I think we are definitely making some progress here. 08/11/2021 upon evaluation today patient's wound on the heel actually showing signs of excellent improvement. I am actually very pleased with where things stand currently. No fevers, chills, nausea, vomiting, or diarrhea. There does not appear to be any need for sharp debridement today either which is also great news. 08/25/2021 upon evaluation today patient appears to be doing well with regard to his heel ulcer. This is actually looking significantly improved compared to last time I saw him. Fortunately there does not appear to be any evidence of active infection at this time. No fevers, chills, nausea, vomiting, or diarrhea. 09/01/2021 upon evaluation today patient appears to  be doing decently well in regard to his wounds. Fortunately there does not appear to be any signs of active infection at this time which is great news and overall very pleased in that regard. I do not see any evidence of active infection systemically which is great news as well. No fevers, chills, nausea, vomiting, or diarrhea. I think that the heel is making excellent progress. 09/15/2021 upon evaluation today patient's heel unfortunately is significantly worse compared to what it was previous. I do believe that at this time he would benefit from switching back to something a little bit more offloading from the cushion shoe he has right now this is more of a heel protector what I really need is a Prevalon offloading boot which I think is good to do a lot better for him than just the small heel protector. 09/23/2021 upon evaluation today patient appears to be doing a little better  in regards to last week's visit. Overall I think that he is making good progress which is great news and there does not appear to be any evidence of active infection at this time. No fevers, chills, nausea, vomiting, or diarrhea. 09/30/2021 upon evaluation patient's heel is actually showing signs of good improvement which is great news. Fortunately there does not appear to be any evidence of active infection locally nor systemically at this point. No fevers, chills, nausea, vomiting, or diarrhea. 10/07/2021 upon evaluation today patient appears to be doing well currently in regard to his heel ulcer. He has been tolerating the dressing changes without complication. Fortunately I do not see any signs of active infection at this time which is great news. No fevers, chills, nausea, vomiting, or diarrhea. 12/27; wound is measuring smaller. We have been using silver alginate 10/28/2021 upon evaluation today patient appears to be doing well currently in regard to his heel ulcer. I am actually very pleased with where things stand and  I think he is making excellent progress. Fortunately I do not see any signs of active infection locally nor systemically at this point which is great news. Nonetheless I do believe that the patient would benefit from a new offloading shoe and has been using it Velcro is no longer functioning properly and he tells me that he almost tripped and fell because of that today. Obviously I do not want him following that would be very bad. [name], [name] ([id number]) [id number] Page 4 of 14 11/11/2021 upon evaluation today patient appears to be doing excellent in regard to his wound. In fact this appears to be completely healed based on what I see currently. I do not see any signs of anything open or draining and I did double check just by clearing away some of the callus around the edges of the wound to ensure that there was nothing still open or hiding underneath. Once this was cleared away it appeared that the patient was doing significantly better at this time which is great news and there was nothing actually open. Readmission: 06-19-2022 upon evaluation today patient appears to be doing well currently in regard to his heel ulcer all things considered. He unfortunately is having a bit of breakdown in general as far as the heel is concerned not nearly as bad as what we noted last time he was here in the clinic. The good news is I do think that this should hopefully heal much more rapidly than what we noted last time. His past medical history has not changed he is on Coumadin. 07-03-2022 upon evaluation today patient appears to be doing better in regard to his heel. We will start to see some improvement here which is good news. Fortunately I do not see any evidence of infection locally or systemically at this time which is great news. No fevers, chills, nausea, vomiting, or diarrhea. 07-10-2022 upon evaluation today patient appears to be doing well currently in regard to his wound  from the callus standpoint around the edges. Unfortunately from an actual wound bed standpoint he has some deep tissue injury noted at this point. Again I am not sure exactly what is going on that is causing this pressure to the region but he is definitely gotten pressure that is occurring and causing this to not heal as effectively as what we would like to see. I discussed with him today that he may need to at night when he sleeping use a pillow up underneath his calf in  order to prevent the heel from touching the bed at all. I also think that it may be beneficial for him to monitor throughout his day and make sure there is no other time when he is getting the pressure to the heel. 07-23-2022 upon evaluation today patient appears to be doing poorly currently in regard to his heel ulcer. He has been tolerating the dressing changes without complication. Unfortunately I feel like he may be infected based on what I am seeing. 07-28-2022 I did review patient's culture results which showed positive for Proteus as well as Staphylococcus both of which would be treated with the Bactrim DS that I gave him at the last visit. This is good news and hopefully means that we should be able to apply the cast today as long as the wound itself does not appear to be doing poorly. Patient's wound bed actually showed signs of doing quite well and I am very pleased with where things stand and I do not see any signs of worsening overall which is great news. 08-04-2022 upon evaluation today patient appears to be doing well currently in regard to his heel ulcer. I am actually very pleased with where things stand and I do think this is looking significantly better. With regard to his right shin that is also showing signs of improvement which is great news. 08-10-2022 upon evaluation today patient's wound on the heel actually appears to be doing significantly better. In regard to the right anterior shin this is showing signs of  healing it might even be completely healed but I still get a monitor I cannot get anything to fill away but also could not see where there was anything actually draining at this point. I am tending to think healed but I want a monitor 1 more week before I call it for sure. 08-17-2022 upon evaluation today patient appears to be doing well currently in regard to his heel. Fortunately he is tolerating the dressing changes without complication. I do not see any signs of infection and overall I think he is making good progress here. We did get approval for the Apligraf but I think he had a $295 co-pay that would have to be paid per application. With that being said as good as he is doing right now I do not think that is necessary but is still an option depending on how things progress if we need to speed this up quite a bit. 08/24/2022; this patient has a wound on his left heel in the setting of type 2 diabetes. We have been using Prisma. He has a heel offloading boot 08-31-2022 upon evaluation today patient appears to be doing poorly in regard to his heel ulcer which is still showing signs of bruising I am just not as happy as I was when I saw him 2 weeks ago with this. He saw Dr. Leanord Hawking last week. Also did not look good at that point apparently. Nonetheless I think that the patient is still continuing to have some counterpressure getting to the wound bed unfortunately. 09-07-2022 upon evaluation today patient appears to be doing well currently in regard to his heel ulcer. He has been tolerating the dressing changes without complication. Fortunately there does not appear to be any signs of active infection locally or systemically at this time. Fortunately I do not see any evidence of active infection at this time. 09-15-2022 upon evaluation today patient appears to be doing well currently in regard to his legs which in fact appear  to be completely healed this is great news. With that being said his heel  does have some erythema and warmth as well as drainage I am concerned about cellulitis at this location. 09-21-2022 upon evaluation today patient's wound is actually showing signs of being a little bit smaller but still we are not doing nearly as well as what I would like to see. Fortunately I do not see any signs of infection at this time which is great news and overall I am extremely pleased in that regard. With that being said I do think that he needs to continue to elevate his leg although the edema is under great control with a compression wrap I think switching to a different dressing topically may be beneficial. My suggestion is to switch him over to a Iodoflex dressing. 09-28-2022 upon evaluation today patient appears to be doing well with regard to his heel ulcer. This is actually showing signs of improvement which is great news and overall I am extremely pleased with where we stand today. 10-06-2022 upon evaluation today patient actually appears to be making excellent progress at this point. Fortunately there does not appear to be any signs of active infection locally nor systemically which is great news and overall I am extremely pleased with where we stand. I do feel like he is actually making some pretty good progress here as we switch to the wrap along with the Iodoflex. 12/26; patient has a wound on the tip of his left heel. This is not a weightbearing surface. He is using a heel offloading boot and carefully offloading this at other times during the day. He is using Iodoflex and Zetuvitunder 3 layer compression 10-22-2022 upon evaluation today patient appears to be doing well currently in regard to his wound. He has been tolerating the dressing changes without complication. Fortunately there does not appear to be any signs of active infection locally nor systemically at this time. No fevers, chills, nausea, vomiting, or diarrhea. 08-30-2023 upon evaluation today patient appears to be  doing well currently in regard to his heel ulcer which is showing signs of good improvement. Fortunately I do not see any evidence of infection locally nor systemically which is great news and overall I am extremely pleased with where we stand. No fevers, chills, nausea, vomiting, or diarrhea. Unfortunately he does have a wound on the side of his fifth toe where I think this did rub on the shoe. 11-05-2022 upon evaluation today patient still still showing some signs of pressure at this point and there may have even been some fluid collection at this time. Fortunately I do not see any evidence of active infection locally nor systemically which is great news and overall I am extremely pleased with where we stand. No fevers, chills, nausea, vomiting, or diarrhea. 1/25; the patient's area on the dorsal aspect of the left fifth toe is healed. We have been using Iodoflex to the area on the left heel. The lateral wound is slightly down in length. 11-19-2022 upon evaluation today patient appears to be doing well currently in regard to his wound. He has been tolerating the dressing changes without complication. Fortunately there does not appear to be any signs of infection we been using Iodoflex up to this point. 11-26-2022 upon evaluation today patient appears to be doing poorly currently in regard to his wound. He has been tolerating the dressing changes with the Iodoflex without complication. With that being said he does not have any signs of active infection systemically  though locally I do feel like there is some evidence of infection here. 12-03-2022 upon evaluation today patient appears to be doing well currently in regard to his wound this is better with the Bactrim compared to last week still there [name], [name] ([id number]) [id number] Page 5 of 14 was some significant slough and biofilm buildup which is going require sharp debridement at this point. Fortunately I do not see  any signs of active infection locally nor systemically which is great news. 12-11-2022 upon evaluation today patient actually appears to be doing significantly better at this time in regard to his heel. I am actually very pleased with where things stand currently. There is no signs of active infection locally or systemically at this point. 12-17-2022 upon evaluation today patient appears to be doing better as far as the overall appearance of his wound. I am actually very pleased in that regard. Fortunately there does not appear to be any signs of active infection locally nor systemically at this time. I do feel like that the patient is showing signs of excellent improvement in general. Nonetheless he needs something better for compression I think he should go ahead and see about getting compression socks. 3/7; tip of the left heel measurements are smaller he has been using Hydrofera Blue 01-07-2023 upon evaluation today patient appears to be doing well currently in regard to his wound. This has not been debrided since I last saw him and it definitely shows that definitely needs some debridement today. Fortunately I do not see any evidence of active infection locally nor systemically which is great news. 01-14-2023 upon evaluation today patient appears to be doing well currently although he does have some erythema and warmth to the heel I feel like he may have developed an infection yet again. This is something that is been ongoing issue we have been trying to manage his foot best we could. With that being said I do think he may need to go back on the Bactrim this has done well for him in the past. 01-21-2023 upon evaluation today patient appears to be doing better in regard to his heel. He is doing very well with the antibiotics and that is made a big difference for him based on what I am seeing I am very pleased with where we stand today. He is obviously doing extremely well which is great  news. 01-28-2023 upon evaluation today patient appears to be doing well currently in regard to his wound. This is actually showing signs of excellent improvement I am actually very pleased with where we stand and I do believe that he is making good progress here. Fortunately I do not see any evidence of active infection locally nor systemically which is great news. No fevers, chills, nausea, vomiting, or diarrhea. 02-04-2023 upon evaluation today patient's wound is actually showed signs of excellent improvement I am actually very pleased with where we stand. I do not see any signs of active infection locally nor systemically which is great news and in general I do believe that we are moving in the right direction here. No fevers, chills, nausea, vomiting, or diarrhea. 02-11-2023 upon evaluation patient's wound actually showing signs of excellent improvement I am actually very pleased with where we stand I think that his pain is better there is no signs of infection we will continue to use the gentamicin and overall we are making excellent progress. 02-18-2023 upon evaluation today patient appears to be doing well currently in regard to his wound.  He has been tolerating the dressing changes without complication. Fortunately I do not see any signs of active infection locally nor systemically which is great news. 02-25-2023 upon evaluation today patient's heel actually showed signs of excellent improvement. I am actually very pleased with where we stand today and I feel like that he is making really good progress. Fortunately I do not see any signs of active infection which is good news. No fevers, chills, nausea, vomiting, or diarrhea. 03-04-2023 upon evaluation today patient appears to be doing pretty well currently in regard to his heel. He has been tolerating the dressing changes without complication. Fortunately there does not appear to be any signs of active infection locally nor systemically which is  great news. No fevers, chills, nausea, vomiting, or diarrhea. 03-11-2023 upon evaluation today patient appears to be doing poorly in regard to his heel ulcer. He tells me he has been having to sleep on his back to having trouble breathing therefore I think he has been having some issues here as far as the heel is concerned it looks like he has a deep tissue injury yet again at this point. Fortunately I do not see any signs of infection which is good news but at the same time I do feel like that he is doing worse than where he was previous. I do not see any signs of active infection systemically which is great news. 03-18-2023 upon evaluation today patient appears to be doing well currently in fact the heel is significantly better compared to last week. I am extremely pleased with where we are seeing I am hopeful that he will continue to show signs of improvement going forward. 03-25-2023 upon evaluation today patient appears to be doing well currently in regard to his wounds. He is actually showing signs of improvement on the heel and I am very pleased in that regard. Fortunately I do not see any signs of active infection at this time which is great news. No fevers, chills, nausea, vomiting, or diarrhea. 04-05-2023 upon evaluation today patient's wound is actually showing signs of good improvement. Actually very pleased with where things stand today. Fortunately there does not appear to be any signs of active infection locally or systemically which is great news. 04-19-2023 upon evaluation today patient's wound actually looks the best it has in quite some time. He did have a wrap on for 2 weeks which I think is caused a little bit of irritation going to the forefoot everything else looks to be doing well the wrap did slide down a little bit as well. Fortunately I do not see any signs of infection however I do think that in the future if he has any issues with not being able to get scheduled to see me he  should definitely make an appointment at least for a nurse visit he is aware and in agreement with plan going forward. 04-26-2023 upon evaluation today patient's wound is actually significantly smaller this is great news he is making good progress in the past really 5 or 6 visits we have been seeing improvement week by week I am very pleased in that regard. 05-03-2023 upon evaluation today patient appears to be doing excellent in regard to the wound on his heel. This is actually showing signs of improvement he continues to make good progress towards complete closure which is great news. 7/23; wound continues to do nicely. Smaller with healthy epithelialization. This is actually a reopening we had already healed this area once and we  discussed this this visit. 05-18-2023 upon evaluation today patient's wound actually showing signs of improvement. Fortunately I do not see any signs of active infection locally or systemically which is great news. No fevers, chills, nausea, vomiting, or diarrhea. 05-24-2023 upon evaluation today patient's wound on the heel appears to be doing quite well. Fortunately there does not appear to be any signs of active infection at this time which is great news. Fortunately I think that he is making excellent headway with the current treatment regimen. 06-01-2023 upon evaluation today patient appears to be doing excellent in regard to his wound on the heel this is actually showing signs of being smaller measuring better and looking like is doing quite well. Fortunately I do not see any signs of infection at this time. He does have a small wound on the leg which we can also need to address this looks like it was just a small blister that ruptured I think it should heal pretty quickly. 06-07-2023 upon evaluation today patient appears to be doing well currently in regard to his wound. He has been tolerating the dressing changes without complication. Fortunately there does not appear to  be any signs of active infection locally or systemically at this time. No fevers, chills, nausea, vomiting, or diarrhea. 8/26; wound near the tip of the left heel. He is a diabetic. We have been using Hydrofera Blue under Urgo K2 lite wrap he has been in his surgical shoe although he tells me it disintegrated and he came in on a regular running shoe today. 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 He is not wearing compression stocking [name], [name] ([id number]) [id number] Page 6 of 14 06-22-2023 upon evaluation today patient appears to be doing excellent in regard to his wound on the heel which is actually significantly smaller compared to last week's evaluation. Fortunately I do not see any signs of active infection locally or systemically at this time. 06-28-2023 upon evaluation today patient appears to be doing well currently in regard to his heel which is actually showing signs of improvement. Fortunately I do not see any evidence of active infection at this time which is great news and in general I do believe that we are making headway towards complete closure which is good news. No fevers, chills, nausea, vomiting, or diarrhea. 07-05-2023 upon evaluation today patient appears to be doing well currently in regard to his heel ulcer. This is actually very close I think that being completely healed. This is great news and overall very pleased with where we stand today. 07-12-2023 upon evaluation today patient appears to be doing well currently in regard to his wound. He in fact appears to be very close to complete resolution I am very pleased that regard. However on the tip of his foot there appears to be some issue here with inflammation secondary to infection. I am very concerned about the infection at the site. 07-19-2023 upon evaluation today patient appears to actually be doing well in regard to his heel. Fortunately I do not see any signs of  worsening overall. In general I think you are making headway towards closure. 07-26-2023 upon evaluation today patient appears to be doing well currently in regard to his wound. He has in fact completely healed and looks to be doing great. I still want to keep an eye on things due to the end of his foot being red although I think this may be secondary to the Tubigrip  more than anything to be honest. Fortunately I do not see any signs of active infection at this time which is great news. No fevers, chills, nausea, vomiting, or diarrhea. 08-02-2023 upon evaluation today patient appears to be doing great in regard to his heel ulcer. He has been tolerating the dressing changes without complication. Fortunately I do not see any signs of active infection locally or systemically at this time. 08-31-2023 upon evaluation today patient appears to be doing well currently in regard to his heel this is still closed and looks great unfortunately has a wound on his finger he tells me this is a blister that showed up he popped it with a needle and it is continued to give him trouble since that time. Fortunately I do not see any signs of active infection at this time which is great news. No fevers, chills, nausea, vomiting, or diarrhea. 09-09-2023 upon evaluation today patient appears to be doing well currently in regard to his heel he still needs to make sure to be put in the urea cream on this to soften up the callus but other than that seems to be doing quite well. Upon inspection patient's finger actually showing signs of improvement as well which and very pleased in regard to with that being said I do think that he needs to monitor for any signs of infection or worsening obviously in general. 09-20-2023 upon evaluation today patient appears to be doing well currently in regard to his wound. He has been tolerating the dressing changes without complication. Fortunately I do not see any signs of active infection  locally or systemically at this time which is good news. Electronic Signature(s) Signed: 09/20/2023 2:17:41 PM By: [name] Derry PA-C Entered By: [name] on 09/20/2023 14:17:40 -------------------------------------------------------------------------------- Physical Exam Details Patient Name: Date of Service: [name], [name] 09/20/2023 1:45 PM Medical Record Number: [id number] Patient Account Number: [id number] Date of Birth/Sex: Treating RN: 1946-08-14 (77 y.o. [name] Primary Care Provider: Aram [name] Other Clinician: Betha [name] Referring Provider: Treating Provider/Extender: [name] Weeks in Treatment: 76 Constitutional Well-nourished and well-hydrated in no acute distress. Respiratory normal breathing without difficulty. Psychiatric this patient is able to make decisions and demonstrates good insight into disease process. Alert and Oriented x 3. pleasant and cooperative. Notes Upon inspection patient's wound bed actually showed signs of good granulation epithelization at this point. Fortunately I do not see any signs of active infection at this time which is good news and in general I do believe that we are making excellent headway here towards closure. Electronic Signature(s) Signed: 09/20/2023 2:17:57 PM By: [name] Derry PA-C Entered By: [name] on 09/20/2023 14:17:57 [name] ([id number]) [id number] Page 7 of 14 -------------------------------------------------------------------------------- Physician Orders Details Patient Name: Date of Service: [name], GEO [facility] J. 09/20/2023 1:45 PM Medical Record Number: [id number] Patient Account Number: [id number] Date of Birth/Sex: Treating RN: 01-05-1946 (77 y.o. [name] Primary Care Provider: Aram [name] Other Clinician: Betha [name] Referring Provider: Treating Provider/Extender: [name] in Treatment:  51 The following information was scribed by: [name] The information was scribed for: [name] Verbal / Phone Orders: No Diagnosis Coding ICD-10 Coding Code Description E11.622 Type 2 diabetes mellitus with other skin ulcer S60.410A Abrasion of right index finger, initial encounter E11.42 Type 2 diabetes mellitus with diabetic polyneuropathy Follow-up Appointments Return Appointment in 1 week. Nurse Visit as needed [employer] wounds with antibacterial soap and water. Additional Orders /  Instructions Other: - apply Urea cream to heels at night, use heel cup for protection Wound Treatment Wound #16 - Hand - 2nd Digit Wound Laterality: Right Cleanser: Normal Saline 1 x Per Day/30 Days Discharge Instructions: Wash your hands with soap and water. Remove old dressing, discard into plastic bag and place into trash. Cleanse the wound with Normal Saline prior to applying a clean dressing using gauze sponges, not tissues or cotton balls. Do not scrub or use excessive force. Pat dry using gauze sponges, not tissue or cotton balls. Cleanser: Soap and Water 1 x Per Day/30 Days Discharge Instructions: Gently cleanse wound with antibacterial soap, rinse and pat dry prior to dressing wounds Prim Dressing: Xeroform-HBD 2x2 (in/in) 1 x Per Day/30 Days ary Discharge Instructions: Apply Xeroform-HBD 2x2 (in/in) as directed Secondary Dressing: Coverlet Latex-Free Fabric Adhesive Dressings 1 x Per Day/30 Days Discharge Instructions: 1.5 x 2 Electronic Signature(s) Signed: 09/20/2023 3:39:27 PM By: [name] Derry PA-C Signed: 09/20/2023 4:03:25 PM By: [name] Entered By: [name] on 09/20/2023 14:13:00 [name] ([id number]) [id number] Page 8 of 14 -------------------------------------------------------------------------------- Problem List Details Patient Name: Date of Service: [name], [name] 09/20/2023 1:45 PM Medical  Record Number: [id number] Patient Account Number: [id number] Date of Birth/Sex: Treating RN: 07-Mar-1946 (77 y.o. [name] Primary Care Provider: Aram [name] Other Clinician: Betha [name] Referring Provider: Treating Provider/Extender: [name] Weeks in Treatment: 82 Active Problems ICD-10 Encounter Code Description Active Date MDM Diagnosis E11.622 Type 2 diabetes mellitus with other skin ulcer 06/19/2022 No Yes S60.410A Abrasion of right index finger, initial encounter 08/31/2023 No Yes E11.42 Type 2 diabetes mellitus with diabetic polyneuropathy 06/19/2022 No Yes Inactive Problems Resolved Problems ICD-10 Code Description Active Date Resolved Date L97.522 Non-pressure chronic ulcer of other part of left foot with fat layer exposed 06/19/2022 06/19/2022 Electronic Signature(s) Signed: 09/20/2023 1:49:09 PM By: [name] Derry PA-C Entered By: [name] on 09/20/2023 13:49:09 -------------------------------------------------------------------------------- Progress Note Details Patient Name: Date of Service: [name], [name] 09/20/2023 1:45 PM Medical Record Number: [id number] Patient Account Number: [id number] Date of Birth/Sex: Treating RN: October 19, 1946 (77 y.o. [name] Primary Care Provider: Aram [name] Other Clinician: Betha [name] Referring Provider: Treating Provider/Extender: Chivas, Watring ([id number]) [id number] Page 9 of 14 Weeks in Treatment: 65 Subjective Chief Complaint Information obtained from Patient Right index finger ulcer History of Present Illness (HPI) 09/24/2020 on evaluation today patient presents today for a heel ulcer that he tells me has been present for about 2 years. He has been seeing podiatry and they have been attempting to manage this including what sounds to be a total contact cast, Unna boot, and just standard dressings otherwise as well.  Most recently has been using triple antibiotic ointment. With that being said unfortunately despite everything he really has not had any significant improvement. He tells me that he cannot even really remember exactly how this began but he presumed it may have rubbed on his shoes or something of that nature. 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 He tells me that this does seem to help. Fortunately there is no signs of active infection at this time. His most recent hemoglobin A1c  was 8.1 though he knows this was this year he cannot tell me the exact time. His fluid pills currently to help with some of the lower extremity edema although he does obviously have signs of venous stasis/lymphedema. Currently there is no evidence of active infection. No fevers, chills, nausea, vomiting, or diarrhea. Patient has had fairly recent ABIs which were performed on 07/19/2020 and revealed that he has normal findings in both the ankle and toe locations bilaterally. His ABI on the right was 1.09 on the left was 1.08 with a TBI on the right of 0.88 and on the left of 0.94. Triphasic flow was noted throughout. 10/08/2020 on evaluation today patient appears to be doing pretty well in regard to his left heel currently in fact this is doing a great job and seems to be healing quite nicely. Unfortunately on his right leg he had a pile of wood that actually fell on him injuring his right leg this is somewhat erythematous has me concerned little bit about cellulitis though there is not really a good area to culture at this point. 10/24/2020 upon evaluation today patient appears to be doing well with regard to his heel ulcer. He is showing signs of  improvement which is great news. His right leg is completely healed. Overall I feel like he is doing excellent and there is no signs of infection. 11/07/2020 upon evaluation today patient appears to be doing well with regard to his heel ulcer. He tells me that last week when he was unable to come in his wife actually thought that the wound was very close to closing if not closed. Then it began to "reopen again". I really feel like what may have happened as the collagen may have dried over the wound bed and that because that misunderstanding with thinking that the wound was healing. With that being said I did not see it last week I do not know that for certain. Either way I feel like he is doing great today I see no signs of infection at this point. 11/26/2020 upon inspection today patient appears to be doing decently well in regard to his heel ulcer. He has been tolerating the dressing changes without complication. Fortunately there is no sign of active infection at this time. No fevers, chills, nausea, vomiting, or diarrhea. 12/10/2020 upon evaluation today patient appears to be doing fairly well in regard to the wound on his heel as well as what appears to be a new wound of the left first metatarsal head plantar aspect. This seems to be an area that was callus that has split as the patient tells me has been trying to walk on his toes more has probably where this came from. With that being said there does not appear to be signs of active infection which is great news. 12/17/2020 upon evaluation today patient actually appears to be making good progress currently. Fortunately there is no evidence of active infection at this time. Overall I feel like he is very close to complete closure. 12/26/2020 upon evaluation today patient appears to be doing excellent in regard to his wounds. In fact I am not certain that these are not even completely healed on initial inspection. Overall I am very pleased with where  things stand today. Good news is that they are healed he is actually get ready to go out of town and that will be helpful as well as he will be a full part of the time. Readmission: 02/04/2021 upon evaluation today patient  appears to be doing well at this point in regard to his left heel that I previously saw him for. Unfortunately he is having issues with his right lower extremity. He has significant wounds at this point he also has erythema noted there is definite signs of cellulitis which is unfortunate. With that being said I think we do need to address this sooner rather than later. The good news is he did have a nice trip to the beach. He tells me that he had no issues during that time. 4/27; patient on Bactrim. Culture showed methicillin sensitive staph aureus therefore the Bactrim should be effective. 2 small areas on the right leg are healed the area on the mid aspect of the tibia almost 100% covered in a very adherent necrotic debris. We have been using silver alginate 02/20/2021 upon evaluation today patient appears to be doing well with regard to his wound. He is showing signs of improvement which is great news overall very pleased with where things stand today. No fevers, chills, nausea, vomiting, or diarrhea. 02/27/2021 upon evaluation today patient appears to be doing well with regard to his leg ulcer. He is tolerating the dressing changes and overall appears to be doing quite excellent. I am extremely pleased with where things stand and overall I think patient is making great progress. There is no sign of active infection at this time which is also great news. 03/06/2021 upon evaluation today patient appears to be doing excellent in regard to his wounds. In fact he appears to be completely healed today based on what I am seeing. This is excellent news and overall I am extremely pleased with where he stands. Overall the patient is happy to hear this as well this has been quite sometime  coming. Readmission: 04/01/2021 patient unfortunately returns for readmission today. He tells me that he has been wearing his compression socks on the right leg daily and he does not really know what is going on and why his legs are doing what they are doing. We did therefore go ahead and probe deeper into exactly what has been going on with him today. Subsequently the patient tells me that when he gets up and what we would call "first thing in the morning" are really 2 different things". He does not tend to sleep well so he tells me that he will often wake up at 3:00 in the morning. He will then potentially going into the living room to get in his chair where he may read a book for a little while and then potentially fall asleep back in his chair. He then subsequently wake up around 5:00 or so and then get up and in his words "putter around". This often will end with him proceeding at some point around 8 AM or 9 AM to putting on his compression socks. With that being said this means that anywhere from the 3:00 in the morning till roughly around 9:00 in the morning he has no compression on yet he is sitting in his recliner, walking around and up and about, and this is at least 5 to 6 hours of noncompressed time. That may be our issue here but I did not realize until we discussed this further today. Fortunately there does not appear to be any signs of active infection at this time which is great news. With that being said he has multiple wounds of the bilateral lower extremities. 04/10/2021 upon evaluation today patient appears to be doing well with regard to his  legs for the most part. He does look like he had some injury where his wrap slid down nonetheless he did some "doctoring on them". I do feel like most of the areas honestly have cleared back up which is great news. There does not appear to be any signs of active infection at this time which is also great news. No fevers, chills, nausea, vomiting,  or diarrhea. 04/18/2021 upon evaluation today patient appears to be doing well with regard to his wounds in fact he appears to be completely healed which is great news. Fortunately there does not appear to be any signs of active infection which is great news. No fevers, chills, nausea, vomiting, or diarrhea. READMISSION 07/28/2021 [name], [name] ([id number]) [id number] Page 10 of 14 This is a now 77 year old man we have had in this clinic for quite a bit of this year. 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 He wears compression stockings. 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 He comes in an old running shoes although he says he wears better shoes at home. His history is that he noticed a blister in the right posterior heel. This open. He has been applying Neosporin to it currently the wound measures 2 x 1.4 cm. 100% slough covered. Not really offloading this in any rigorous way. Past medical history is essentially unchanged he has paroxysmal atrial fibrillation on Coumadin type 2 diabetes with a recent hemoglobin A1c of 7 on metformin. ABI in our clinic was 0.98 on the right 08/05/2021 upon evaluation today patient appears to be doing well with regard to his heel ulcer. Fortunately there does not appear to be any signs of active infection at this time. Overall I been very pleased with where things seem to be currently as far as the wound healing is concerned. Again this is first a lot of seeing him Dr. Leanord Hawking readmitted him last week. Nonetheless I think we are definitely making some progress here. 08/11/2021 upon evaluation today patient's wound on the heel actually showing signs of excellent improvement. I am actually very pleased with where things stand currently. No fevers, chills, nausea, vomiting, or diarrhea. There does not appear to be any need  for sharp debridement today either which is also great news. 08/25/2021 upon evaluation today patient appears to be doing well with regard to his heel ulcer. This is actually looking significantly improved compared to last time I saw him. Fortunately there does not appear to be any evidence of active infection at this time. No fevers, chills, nausea, vomiting, or diarrhea. 09/01/2021 upon evaluation today patient appears to be doing decently well in regard to his wounds. Fortunately there does not appear to be any signs of active infection at this time which is great news and overall very pleased in that regard. I do not see any evidence of active infection systemically which is great news as well. No fevers, chills, nausea, vomiting, or diarrhea. I think that the heel is making excellent progress. 09/15/2021 upon evaluation today patient's heel unfortunately is significantly worse compared to what it was previous. I do believe that at this time he would benefit from switching back to something a little bit more offloading from the cushion shoe he has right now this is more of a heel protector what I really need is a Prevalon offloading boot which I think is good to do a lot better for him than just  the small heel protector. 09/23/2021 upon evaluation today patient appears to be doing a little better in regards to last week's visit. Overall I think that he is making good progress which is great news and there does not appear to be any evidence of active infection at this time. No fevers, chills, nausea, vomiting, or diarrhea. 09/30/2021 upon evaluation patient's heel is actually showing signs of good improvement which is great news. Fortunately there does not appear to be any evidence of active infection locally nor systemically at this point. No fevers, chills, nausea, vomiting, or diarrhea. 10/07/2021 upon evaluation today patient appears to be doing well currently in regard to his heel ulcer. He has  been tolerating the dressing changes without complication. Fortunately I do not see any signs of active infection at this time which is great news. No fevers, chills, nausea, vomiting, or diarrhea. 12/27; wound is measuring smaller. We have been using silver alginate 10/28/2021 upon evaluation today patient appears to be doing well currently in regard to his heel ulcer. I am actually very pleased with where things stand and I think he is making excellent progress. Fortunately I do not see any signs of active infection locally nor systemically at this point which is great news. Nonetheless I do believe that the patient would benefit from a new offloading shoe and has been using it Velcro is no longer functioning properly and he tells me that he almost tripped and fell because of that today. Obviously I do not want him following that would be very bad. 11/11/2021 upon evaluation today patient appears to be doing excellent in regard to his wound. In fact this appears to be completely healed based on what I see currently. I do not see any signs of anything open or draining and I did double check just by clearing away some of the callus around the edges of the wound to ensure that there was nothing still open or hiding underneath. Once this was cleared away it appeared that the patient was doing significantly better at this time which is great news and there was nothing actually open. Readmission: 06-19-2022 upon evaluation today patient appears to be doing well currently in regard to his heel ulcer all things considered. He unfortunately is having a bit of breakdown in general as far as the heel is concerned not nearly as bad as what we noted last time he was here in the clinic. The good news is I do think that this should hopefully heal much more rapidly than what we noted last time. His past medical history has not changed he is on Coumadin. 07-03-2022 upon evaluation today patient appears to be doing better  in regard to his heel. We will start to see some improvement here which is good news. Fortunately I do not see any evidence of infection locally or systemically at this time which is great news. No fevers, chills, nausea, vomiting, or diarrhea. 07-10-2022 upon evaluation today patient appears to be doing well currently in regard to his wound from the callus standpoint around the edges. Unfortunately from an actual wound bed standpoint he has some deep tissue injury noted at this point. Again I am not sure exactly what is going on that is causing this pressure to the region but he is definitely gotten pressure that is occurring and causing this to not heal as effectively as what we would like to see. I discussed with him today that he may need to at night when he sleeping use  a pillow up underneath his calf in order to prevent the heel from touching the bed at all. I also think that it may be beneficial for him to monitor throughout his day and make sure there is no other time when he is getting the pressure to the heel. 07-23-2022 upon evaluation today patient appears to be doing poorly currently in regard to his heel ulcer. He has been tolerating the dressing changes without complication. Unfortunately I feel like he may be infected based on what I am seeing. 07-28-2022 I did review patient's culture results which showed positive for Proteus as well as Staphylococcus both of which would be treated with the Bactrim DS that I gave him at the last visit. This is good news and hopefully means that we should be able to apply the cast today as long as the wound itself does not appear to be doing poorly. Patient's wound bed actually showed signs of doing quite well and I am very pleased with where things stand and I do not see any signs of worsening overall which is great news. 08-04-2022 upon evaluation today patient appears to be doing well currently in regard to his heel ulcer. I am actually very pleased  with where things stand and I do think this is looking significantly better. With regard to his right shin that is also showing signs of improvement which is great news. 08-10-2022 upon evaluation today patient's wound on the heel actually appears to be doing significantly better. In regard to the right anterior shin this is showing signs of healing it might even be completely healed but I still get a monitor I cannot get anything to fill away but also could not see where there was anything actually draining at this point. I am tending to think healed but I want a monitor 1 more week before I call it for sure. 08-17-2022 upon evaluation today patient appears to be doing well currently in regard to his heel. Fortunately he is tolerating the dressing changes without complication. I do not see any signs of infection and overall I think he is making good progress here. We did get approval for the Apligraf but I think he had a $295 co-pay that would have to be paid per application. With that being said as good as he is doing right now I do not think that is necessary but is still an option depending on how things progress if we need to speed this up quite a bit. 08/24/2022; this patient has a wound on his left heel in the setting of type 2 diabetes. We have been using Prisma. He has a heel offloading boot 08-31-2022 upon evaluation today patient appears to be doing poorly in regard to his heel ulcer which is still showing signs of bruising I am just not as happy as I was when I saw him 2 weeks ago with this. He saw Dr. Leanord Hawking last week. Also did not look good at that point apparently. Nonetheless I think that the patient is still continuing to have some counterpressure getting to the wound bed unfortunately. [name], [name] ([id number]) [id number] Page 11 of 14 09-07-2022 upon evaluation today patient appears to be doing well currently in regard to his heel ulcer. He has been  tolerating the dressing changes without complication. Fortunately there does not appear to be any signs of active infection locally or systemically at this time. Fortunately I do not see any evidence of active infection at this time. 09-15-2022 upon evaluation  today patient appears to be doing well currently in regard to his legs which in fact appear to be completely healed this is great news. With that being said his heel does have some erythema and warmth as well as drainage I am concerned about cellulitis at this location. 09-21-2022 upon evaluation today patient's wound is actually showing signs of being a little bit smaller but still we are not doing nearly as well as what I would like to see. Fortunately I do not see any signs of infection at this time which is great news and overall I am extremely pleased in that regard. With that being said I do think that he needs to continue to elevate his leg although the edema is under great control with a compression wrap I think switching to a different dressing topically may be beneficial. My suggestion is to switch him over to a Iodoflex dressing. 09-28-2022 upon evaluation today patient appears to be doing well with regard to his heel ulcer. This is actually showing signs of improvement which is great news and overall I am extremely pleased with where we stand today. 10-06-2022 upon evaluation today patient actually appears to be making excellent progress at this point. Fortunately there does not appear to be any signs of active infection locally nor systemically which is great news and overall I am extremely pleased with where we stand. I do feel like he is actually making some pretty good progress here as we switch to the wrap along with the Iodoflex. 12/26; patient has a wound on the tip of his left heel. This is not a weightbearing surface. He is using a heel offloading boot and carefully offloading this at other times during the day. He is using  Iodoflex and Zetuvitunder 3 layer compression 10-22-2022 upon evaluation today patient appears to be doing well currently in regard to his wound. He has been tolerating the dressing changes without complication. Fortunately there does not appear to be any signs of active infection locally nor systemically at this time. No fevers, chills, nausea, vomiting, or diarrhea. 08-30-2023 upon evaluation today patient appears to be doing well currently in regard to his heel ulcer which is showing signs of good improvement. Fortunately I do not see any evidence of infection locally nor systemically which is great news and overall I am extremely pleased with where we stand. No fevers, chills, nausea, vomiting, or diarrhea. Unfortunately he does have a wound on the side of his fifth toe where I think this did rub on the shoe. 11-05-2022 upon evaluation today patient still still showing some signs of pressure at this point and there may have even been some fluid collection at this time. Fortunately I do not see any evidence of active infection locally nor systemically which is great news and overall I am extremely pleased with where we stand. No fevers, chills, nausea, vomiting, or diarrhea. 1/25; the patient's area on the dorsal aspect of the left fifth toe is healed. We have been using Iodoflex to the area on the left heel. The lateral wound is slightly down in length. 11-19-2022 upon evaluation today patient appears to be doing well currently in regard to his wound. He has been tolerating the dressing changes without complication. Fortunately there does not appear to be any signs of infection we been using Iodoflex up to this point. 11-26-2022 upon evaluation today patient appears to be doing poorly currently in regard to his wound. He has been tolerating the dressing changes with the Iodoflex  without complication. With that being said he does not have any signs of active infection systemically though locally I do feel  like there is some evidence of infection here. 12-03-2022 upon evaluation today patient appears to be doing well currently in regard to his wound this is better with the Bactrim compared to last week still there was some significant slough and biofilm buildup which is going require sharp debridement at this point. Fortunately I do not see any signs of active infection locally nor systemically which is great news. 12-11-2022 upon evaluation today patient actually appears to be doing significantly better at this time in regard to his heel. I am actually very pleased with where things stand currently. There is no signs of active infection locally or systemically at this point. 12-17-2022 upon evaluation today patient appears to be doing better as far as the overall appearance of his wound. I am actually very pleased in that regard. Fortunately there does not appear to be any signs of active infection locally nor systemically at this time. I do feel like that the patient is showing signs of excellent improvement in general. Nonetheless he needs something better for compression I think he should go ahead and see about getting compression socks. 3/7; tip of the left heel measurements are smaller he has been using Hydrofera Blue 01-07-2023 upon evaluation today patient appears to be doing well currently in regard to his wound. This has not been debrided since I last saw him and it definitely shows that definitely needs some debridement today. Fortunately I do not see any evidence of active infection locally nor systemically which is great news. 01-14-2023 upon evaluation today patient appears to be doing well currently although he does have some erythema and warmth to the heel I feel like he may have developed an infection yet again. This is something that is been ongoing issue we have been trying to manage his foot best we could. With that being said I do think he may need to go back on the Bactrim this has  done well for him in the past. 01-21-2023 upon evaluation today patient appears to be doing better in regard to his heel. He is doing very well with the antibiotics and that is made a big difference for him based on what I am seeing I am very pleased with where we stand today. He is obviously doing extremely well which is great news. 01-28-2023 upon evaluation today patient appears to be doing well currently in regard to his wound. This is actually showing signs of excellent improvement I am actually very pleased with where we stand and I do believe that he is making good progress here. Fortunately I do not see any evidence of active infection locally nor systemically which is great news. No fevers, chills, nausea, vomiting, or diarrhea. 02-04-2023 upon evaluation today patient's wound is actually showed signs of excellent improvement I am actually very pleased with where we stand. I do not see any signs of active infection locally nor systemically which is great news and in general I do believe that we are moving in the right direction here. No fevers, chills, nausea, vomiting, or diarrhea. 02-11-2023 upon evaluation patient's wound actually showing signs of excellent improvement I am actually very pleased with where we stand I think that his pain is better there is no signs of infection we will continue to use the gentamicin and overall we are making excellent progress. 02-18-2023 upon evaluation today patient appears to be doing  well currently in regard to his wound. He has been tolerating the dressing changes without complication. Fortunately I do not see any signs of active infection locally nor systemically which is great news. 02-25-2023 upon evaluation today patient's heel actually showed signs of excellent improvement. I am actually very pleased with where we stand today and I feel like that he is making really good progress. Fortunately I do not see any signs of active infection which is good news. No  fevers, chills, nausea, vomiting, or diarrhea. 03-04-2023 upon evaluation today patient appears to be doing pretty well currently in regard to his heel. He has been tolerating the dressing changes without complication. Fortunately there does not appear to be any signs of active infection locally nor systemically which is great news. No fevers, chills, nausea, vomiting, or diarrhea. [name], [name] ([id number]) [id number] Page 12 of 14 03-11-2023 upon evaluation today patient appears to be doing poorly in regard to his heel ulcer. He tells me he has been having to sleep on his back to having trouble breathing therefore I think he has been having some issues here as far as the heel is concerned it looks like he has a deep tissue injury yet again at this point. Fortunately I do not see any signs of infection which is good news but at the same time I do feel like that he is doing worse than where he was previous. I do not see any signs of active infection systemically which is great news. 03-18-2023 upon evaluation today patient appears to be doing well currently in fact the heel is significantly better compared to last week. I am extremely pleased with where we are seeing I am hopeful that he will continue to show signs of improvement going forward. 03-25-2023 upon evaluation today patient appears to be doing well currently in regard to his wounds. He is actually showing signs of improvement on the heel and I am very pleased in that regard. Fortunately I do not see any signs of active infection at this time which is great news. No fevers, chills, nausea, vomiting, or diarrhea. 04-05-2023 upon evaluation today patient's wound is actually showing signs of good improvement. Actually very pleased with where things stand today. Fortunately there does not appear to be any signs of active infection locally or systemically which is great news. 04-19-2023 upon evaluation today patient's  wound actually looks the best it has in quite some time. He did have a wrap on for 2 weeks which I think is caused a little bit of irritation going to the forefoot everything else looks to be doing well the wrap did slide down a little bit as well. Fortunately I do not see any signs of infection however I do think that in the future if he has any issues with not being able to get scheduled to see me he should definitely make an appointment at least for a nurse visit he is aware and in agreement with plan going forward. 04-26-2023 upon evaluation today patient's wound is actually significantly smaller this is great news he is making good progress in the past really 5 or 6 visits we have been seeing improvement week by week I am very pleased in that regard. 05-03-2023 upon evaluation today patient appears to be doing excellent in regard to the wound on his heel. This is actually showing signs of improvement he continues to make good progress towards complete closure which is great news. 7/23; wound continues to do nicely. Smaller with  healthy epithelialization. This is actually a reopening we had already healed this area once and we discussed this this visit. 05-18-2023 upon evaluation today patient's wound actually showing signs of improvement. Fortunately I do not see any signs of active infection locally or systemically which is great news. No fevers, chills, nausea, vomiting, or diarrhea. 05-24-2023 upon evaluation today patient's wound on the heel appears to be doing quite well. Fortunately there does not appear to be any signs of active infection at this time which is great news. Fortunately I think that he is making excellent headway with the current treatment regimen. 06-01-2023 upon evaluation today patient appears to be doing excellent in regard to his wound on the heel this is actually showing signs of being smaller measuring better and looking like is doing quite well. Fortunately I do not see any  signs of infection at this time. He does have a small wound on the leg which we can also need to address this looks like it was just a small blister that ruptured I think it should heal pretty quickly. 06-07-2023 upon evaluation today patient appears to be doing well currently in regard to his wound. He has been tolerating the dressing changes without complication. Fortunately there does not appear to be any signs of active infection locally or systemically at this time. No fevers, chills, nausea, vomiting, or diarrhea. 8/26; wound near the tip of the left heel. He is a diabetic. We have been using Hydrofera Blue under Urgo K2 lite wrap he has been in his surgical shoe although he tells me it disintegrated and he came in on a regular running shoe today. 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 He is not wearing compression stocking 06-22-2023 upon evaluation today patient appears to be doing excellent in regard to his wound on the heel which is actually significantly smaller compared to last week's evaluation. Fortunately I do not see any signs of active infection locally or systemically at this time. 06-28-2023 upon evaluation today patient appears to be doing well currently in regard to his heel which is actually showing signs of improvement. Fortunately I do not see any evidence of active infection at this time which is great news and in general I do believe that we are making headway towards complete closure which is good news. No fevers, chills, nausea, vomiting, or diarrhea. 07-05-2023 upon evaluation today patient appears to be doing well currently in regard to his heel ulcer. This is actually very close I think that being completely healed. This is great news and overall very pleased with where we stand today. 07-12-2023 upon evaluation today patient appears to be doing well currently in regard to his wound. He in fact appears to be very close to complete resolution I am  very pleased that regard. However on the tip of his foot there appears to be some issue here with inflammation secondary to infection. I am very concerned about the infection at the site. 07-19-2023 upon evaluation today patient appears to actually be doing well in regard to his heel. Fortunately I do not see any signs of worsening overall. In general I think you are making headway towards closure. 07-26-2023 upon evaluation today patient appears to be doing well currently in regard to his wound. He has in fact completely healed and looks to be doing great. I still want to keep an eye on things due to the end of his foot being red although I think  this may be secondary to the Tubigrip more than anything to be honest. Fortunately I do not see any signs of active infection at this time which is great news. No fevers, chills, nausea, vomiting, or diarrhea. 08-02-2023 upon evaluation today patient appears to be doing great in regard to his heel ulcer. He has been tolerating the dressing changes without complication. Fortunately I do not see any signs of active infection locally or systemically at this time. 08-31-2023 upon evaluation today patient appears to be doing well currently in regard to his heel this is still closed and looks great unfortunately has a wound on his finger he tells me this is a blister that showed up he popped it with a needle and it is continued to give him trouble since that time. Fortunately I do not see any signs of active infection at this time which is great news. No fevers, chills, nausea, vomiting, or diarrhea. 09-09-2023 upon evaluation today patient appears to be doing well currently in regard to his heel he still needs to make sure to be put in the urea cream on this to soften up the callus but other than that seems to be doing quite well. Upon inspection patient's finger actually showing signs of improvement as well which and very pleased in regard to with that being said I  do think that he needs to monitor for any signs of infection or worsening obviously in general. 09-20-2023 upon evaluation today patient appears to be doing well currently in regard to his wound. He has been tolerating the dressing changes without complication. Fortunately I do not see any signs of active infection locally or systemically at this time which is good news. [address] [name], [name] ([id number]) [id number] Page 13 of 14 Constitutional Well-nourished and well-hydrated in no acute distress. Vitals Time Taken: 1:46 PM, Height: 70 in, Weight: 265 lbs, BMI: 38, Temperature: 98.0 F, Pulse: 83 bpm, Respiratory Rate: 18 breaths/min, Blood Pressure: 146/64 mmHg. Respiratory normal breathing without difficulty. Psychiatric this patient is able to make decisions and demonstrates good insight into disease process. Alert and Oriented x 3. pleasant and cooperative. General Notes: Upon inspection patient's wound bed actually showed signs of good granulation epithelization at this point. Fortunately I do not see any signs of active infection at this time which is good news and in general I do believe that we are making excellent headway here towards closure. Integumentary (Hair, Skin) Wound #16 status is Open. Original cause of wound was Blister. The date acquired was: 08/18/2023. The wound has been in treatment 2 weeks. The wound is located on the Right Hand - 2nd Digit. The wound measures 1cm length x 0.3cm width x 0.1cm depth; 0.236cm^2 area and 0.024cm^3 volume. There is Fat Layer (Subcutaneous Tissue) exposed. There is a small amount of serosanguineous drainage noted. There is medium (34-66%) red granulation within the wound bed. There is a medium (34-66%) amount of necrotic tissue within the wound bed including Eschar. Assessment Active Problems ICD-10 Type 2 diabetes mellitus with other skin ulcer Abrasion of right index finger, initial encounter Type 2  diabetes mellitus with diabetic polyneuropathy Procedures Wound #16 Pre-procedure diagnosis of Wound #16 is an Abrasion located on the Right Hand - 2nd Digit . There was a Excisional Skin/Subcutaneous Tissue Debridement with a total area of 0.24 sq cm performed by [name] Derry, PA-C. With the following instrument(s): Curette to remove Viable and Non-Viable tissue/material. Material removed includes Eschar, Subcutaneous Tissue, Slough, Skin: Dermis, and Skin: Epidermis. A time  out was conducted at 14:11, prior to the start of the procedure. A Minimum amount of bleeding was controlled with Pressure. The procedure was tolerated well. Post Debridement Measurements: 1cm length x 0.3cm width x 0.1cm depth; 0.024cm^3 volume. Character of Wound/Ulcer Post Debridement is stable. Post procedure Diagnosis Wound #16: Same as Pre-Procedure Plan Follow-up Appointments: Return Appointment in 1 week. Nurse Visit as needed Bathing/ Shower/ Hygiene: Wash wounds with antibacterial soap and water. Additional Orders / Instructions: Other: - apply Urea cream to heels at night, use heel cup for protection WOUND #16: - Hand - 2nd Digit Wound Laterality: Right Cleanser: Normal Saline 1 x Per Day/30 Days Discharge Instructions: Wash your hands with soap and water. Remove old dressing, discard into plastic bag and place into trash. Cleanse the wound with Normal Saline prior to applying a clean dressing using gauze sponges, not tissues or cotton balls. Do not scrub or use excessive force. Pat dry using gauze sponges, not tissue or cotton balls. Cleanser: Soap and Water 1 x Per Day/30 Days Discharge Instructions: Gently cleanse wound with antibacterial soap, rinse and pat dry prior to dressing wounds Prim Dressing: Xeroform-HBD 2x2 (in/in) 1 x Per Day/30 Days ary Discharge Instructions: Apply Xeroform-HBD 2x2 (in/in) as directed Secondary Dressing: Coverlet Latex-Free Fabric Adhesive Dressings 1 x Per Day/30  Days Discharge Instructions: 1.5 x 2 1. I would recommend that we have the patient go to continue to monitor for any signs of infection or worsening. Based on what I see I do feel like that he is doing very well with the finger I think this is showing signs of excellent improvement. 2. I am good recommend that we continue with the Xeroform gauze dressing here. 3. We will continue with a Band-Aid to cover. We will see patient back for reevaluation in 1 week here in the clinic. If anything worsens or changes patient will contact our office for additional recommendations. [name], SCHACK ([id number]) [id number] Page 14 of 14 Electronic Signature(s) Signed: 09/20/2023 2:18:32 PM By: [name] Derry PA-C Entered By: [name] on 09/20/2023 14:18:32 -------------------------------------------------------------------------------- SuperBill Details Patient Name: Date of Service: [name], [name] 09/20/2023 Medical Record Number: [id number] Patient Account Number: [id number] Date of Birth/Sex: Treating RN: 1946/02/14 (77 y.o. [name] Primary Care Provider: Aram [name] Other Clinician: Betha [name] Referring Provider: Treating Provider/Extender: [name] Weeks in Treatment: 65 Diagnosis Coding ICD-10 Codes Code Description E11.622 Type 2 diabetes mellitus with other skin ulcer S60.410A Abrasion of right index finger, initial encounter E11.42 Type 2 diabetes mellitus with diabetic polyneuropathy Facility Procedures : CPT4 Code: 53664403 Description: 11042 - DEB SUBQ TISSUE 20 SQ CM/< ICD-10 Diagnosis Description S60.410A Abrasion of right index finger, initial encounter Modifier: Quantity: 1 Physician Procedures : CPT4 Code Description Modifier 11042 11042 - WC PHYS SUBQ TISS 20 SQ CM ICD-10 Diagnosis Description S60.410A Abrasion of right index finger, initial encounter Quantity: 1 Electronic Signature(s) Signed: 09/20/2023  2:19:27 PM By: [name] Derry PA-C Entered By: [name] on 09/20/2023 14:19:27

## 2023-09-20 NOTE — Progress Notes (Signed)
IZSAK, HARTUNIAN ([id number]) [id number] Page 1 of 8 Visit Report for 09/20/2023 Arrival Information Details Patient Name: Date of Service: [name], [name] [facility] J. 09/20/2023 1:45 PM Medical Record Number: [id number] Patient Account Number: [id number] Date of Birth/Sex: Treating RN: 1946-01-08 (77 y.o. [name] Primary Care Regla Fitzgibbon: [name] Other Clinician: Betha Loa Referring Jud Fanguy: Treating Rennie Rouch/Extender: [name] in Treatment: 31 Visit Information History Since Last Visit All ordered tests and consults were completed: No Patient Arrived: [name] Added or deleted any medications: No Arrival Time: 13:44 Any new allergies or adverse reactions: No Accompanied By: self Had a fall or experienced change in No Transfer Assistance: None activities of daily living that may affect Patient Identification Verified: Yes risk of falls: Secondary Verification Process Completed: Yes Signs or symptoms of abuse/neglect since last visito No Patient Requires Transmission-Based Precautions: No Hospitalized since last visit: No Patient Has Alerts: Yes Implantable device outside of the clinic excluding No Patient Alerts: Patient on Blood Thinner cellular tissue based products placed in the center Warfarin since last visit: Type II Diabetic Has Dressing in Place as Prescribed: Yes Pain Present Now: No Electronic Signature(s) Signed: 09/20/2023 4:03:25 PM By: Betha Loa Entered By: Betha Loa on 09/20/2023 10:46:03 -------------------------------------------------------------------------------- Clinic Level of Care Assessment Details Patient Name: Date of Service: [name] [facility] J. 09/20/2023 1:45 PM Medical Record Number: [id number] Patient Account Number: [id number] Date of Birth/Sex: Treating RN: 10-02-1946 (77 y.o. [name] Primary Care Giann Obara: [name] Other Clinician: Betha Loa Referring Saloma Cadena: Treating Darold Miley/Extender: [name] in Treatment: 31 Clinic Level of Care Assessment Items TOOL 1 Quantity Score []  - 0 Use when EandM and Procedure is performed on INITIAL visit ASSESSMENTS - Nursing Assessment / Reassessment []  - 0 [name] Physical Exam (combine w/ comprehensive assessment (listed just below) when performed on new pt. evals) []  - 0 Comprehensive Assessment (HX, ROS, Risk Assessments, Wounds Hx, etc.) [name], [name] ([id number]) [id number] Page 2 of 8 ASSESSMENTS - Wound and Skin Assessment / Reassessment []  - 0 Dermatologic / Skin Assessment (not related to wound area) ASSESSMENTS - Ostomy and/or Continence Assessment and Care []  - 0 Incontinence Assessment and Management []  - 0 Ostomy Care Assessment and Management (repouching, etc.) PROCESS - Coordination of Care []  - 0 Simple Patient / Family Education for ongoing care []  - 0 Complex (extensive) Patient / Family Education for ongoing care []  - 0 Staff obtains Chiropractor, Records, T Results / Process Orders est []  - 0 Staff telephones HHA, Nursing Homes / Clarify orders / etc []  - 0 Routine Transfer to another Facility (non-emergent condition) []  - 0 Routine Hospital Admission (non-emergent condition) []  - 0 New Admissions / Manufacturing engineer / Ordering NPWT Apligraf, etc. , []  - 0 Emergency Hospital Admission (emergent condition) PROCESS - Special Needs []  - 0 Pediatric / Minor Patient Management []  - 0 Isolation Patient Management []  - 0 Hearing / Language / Visual special needs []  - 0 Assessment of Community assistance (transportation, D/C planning, etc.) []  - 0 Additional assistance / Altered mentation []  - 0 Support Surface(s) Assessment (bed, cushion, seat, etc.) INTERVENTIONS - Miscellaneous []  - 0 External ear exam []  - 0 Patient Transfer (multiple staff / Nurse, adult / Similar devices) []  -  0 Simple Staple / Suture removal (25 or less) []  - 0 Complex Staple / Suture removal (26 or more) []  - 0 Hypo/Hyperglycemic Management (do not check if billed separately) []  - 0 Ankle /  Brachial Index (ABI) - do not check if billed separately Has the patient been seen at the hospital within the last three years: Yes Total Score: 0 Level Of Care: ____ Electronic Signature(s) Signed: 09/20/2023 4:03:25 PM By: Betha Loa Entered By: Betha Loa on 09/20/2023 11:13:12 -------------------------------------------------------------------------------- Encounter Discharge Information Details Patient Name: Date of Service: [name], [name] [name] 09/20/2023 1:45 PM Medical Record Number: [id number] Patient Account Number: [id number] Date of Birth/Sex: Treating RN: 26-Aug-1946 (77 y.o. [name] Primary Care Dominyck Reser: [name] Other Clinician: Betha Loa Referring Keisean Skowron: Treating Savanah Bayles/Extender: [name], [name] ([id number]) [id number] Page 3 of 8 Weeks in Treatment: 2 Encounter Discharge Information Items Post Procedure Vitals Discharge Condition: Stable Temperature (F): 98.0 Ambulatory Status: Walker Pulse (bpm): 83 Discharge Destination: Home Respiratory Rate (breaths/min): 18 Transportation: Private Auto Blood Pressure (mmHg): 146/64 Accompanied By: self Schedule Follow-up Appointment: Yes Clinical Summary of Care: Electronic Signature(s) Signed: 09/20/2023 4:03:25 PM By: Betha Loa Entered By: Betha Loa on 09/20/2023 11:26:29 -------------------------------------------------------------------------------- Lower Extremity Assessment Details Patient Name: Date of Service: [name], [name] [name] 09/20/2023 1:45 PM Medical Record Number: [id number] Patient Account Number: [id number] Date of Birth/Sex: Treating RN: 06/16/1946 (77 y.o. [name] Primary Care Trusten Hume: [name] Other Clinician: Betha Loa Referring Numa Schroeter: Treating Teiara Baria/Extender: [name] Weeks in Treatment: 65 Electronic Signature(s) Signed: 09/20/2023 3:27:26 PM By: Angelina Pih Signed: 09/20/2023 4:03:25 PM By: Betha Loa Entered By: Betha Loa on 09/20/2023 10:55:43 -------------------------------------------------------------------------------- Multi Wound Chart Details Patient Name: Date of Service: [name], [name] [name] 09/20/2023 1:45 PM Medical Record Number: [id number] Patient Account Number: [id number] Date of Birth/Sex: Treating RN: May 05, 1946 (77 y.o. [name] Primary Care Elaysia Devargas: [name] Other Clinician: Betha Loa Referring Izell Labat: Treating Joyous Gleghorn/Extender: [name] Weeks in Treatment: 40 Vital Signs Height(in): 70 Pulse(bpm): 83 Weight(lbs): 265 Blood Pressure(mmHg): 146/64 Body Mass Index(BMI): 38 Temperature(F): 98.0 Respiratory Rate(breaths/min): [address] J ([id number]):Photos:] [[id number] Page 4 of 8:16 N/A N/A N/A N/A] Right Hand - 2nd Digit N/A N/A Wound Location: Blister N/A N/A Wounding Event: Abrasion N/A N/A Primary Etiology: Cataracts, Arrhythmia, Coronary N/A N/A Comorbid History: Artery Disease, Hypertension, Type II Diabetes, Osteoarthritis, Neuropathy 08/18/2023 N/A N/A Date Acquired: 2 N/A N/A Weeks of Treatment: Open N/A N/A Wound Status: No N/A N/A Wound Recurrence: 1x0.3x0.1 N/A N/A Measurements L x W x D (cm) 0.236 N/A N/A A (cm) : rea 0.024 N/A N/A Volume (cm) : 88.00% N/A N/A % Reduction in Area: 87.80% N/A N/A % Reduction in Volume: Full Thickness Without Exposed N/A N/A Classification: Support Structures Small N/A N/A Exudate A mount: Serosanguineous N/A N/A Exudate Type: red, brown N/A N/A Exudate Color: Medium (34-66%) N/A N/A Granulation Amount: Red N/A N/A Granulation  Quality: Medium (34-66%) N/A N/A Necrotic Amount: Eschar N/A N/A Necrotic Tissue: Fat Layer (Subcutaneous Tissue): Yes N/A N/A Exposed Structures: Fascia: No Tendon: No Muscle: No Joint: No Bone: No Treatment Notes Electronic Signature(s) Signed: 09/20/2023 4:03:25 PM By: Betha Loa Entered By: Betha Loa on 09/20/2023 10:55:50 -------------------------------------------------------------------------------- Multi-Disciplinary Care Plan Details Patient Name: Date of Service: [name], [name] [name] 09/20/2023 1:45 PM Medical Record Number: [id number] Patient Account Number: [id number] Date of Birth/Sex: Treating RN: 1946-03-23 (77 y.o. [name] Primary Care Lukasz Rogus: [name] Other Clinician: Betha Loa Referring Nanci Lakatos: Treating Aleanna Menge/Extender: [name] Weeks in Treatment: 37 Active Inactive Wound/Skin Impairment Nursing Diagnoses: [name], [name] ([id number]) [id number] Page 5 of 8 Impaired tissue  integrity Goals: Patient/caregiver will verbalize understanding of skin care regimen Date Initiated: 06/19/2022 Target Resolution Date: 07/19/2022 Goal Status: Active Ulcer/skin breakdown will have a volume reduction of 30% by week 4 Date Initiated: 06/19/2022 Date Inactivated: 07/28/2022 Target Resolution Date: 07/17/2022 Goal Status: Unmet Unmet Reason: infection Ulcer/skin breakdown will have a volume reduction of 50% by week 8 Date Initiated: 07/28/2022 Date Inactivated: 05/18/2023 Target Resolution Date: 03/20/2023 Goal Status: Unmet Unmet Reason: comorbidities Interventions: Assess patient/caregiver ability to obtain necessary supplies Assess patient/caregiver ability to perform ulcer/skin care regimen upon admission and as needed Assess ulceration(s) every visit Provide education on ulcer and skin care Treatment Activities: Skin care regimen initiated : 06/19/2022 Topical wound management  initiated : 06/19/2022 Notes: Electronic Signature(s) Signed: 09/20/2023 3:27:26 PM By: Angelina Pih Signed: 09/20/2023 4:03:25 PM By: Betha Loa Entered By: Betha Loa on 09/20/2023 11:13:43 -------------------------------------------------------------------------------- Pain Assessment Details Patient Name: Date of Service: [name], [name] [name] 09/20/2023 1:45 PM Medical Record Number: [id number] Patient Account Number: [id number] Date of Birth/Sex: Treating RN: 1946-06-26 (77 y.o. [name] Primary Care Nitesh Pitstick: [name] Other Clinician: Betha Loa Referring Ruairi Stutsman: Treating [name]/Extender: [name] Weeks in Treatment: 43 Active Problems Location of Pain Severity and Description of Pain Patient Has Paino No Site Locations [name], [name] ([id number]) [phone].pdf Page 6 of 8 Pain Management and Medication Current Pain Management: Electronic Signature(s) Signed: 09/20/2023 3:27:26 PM By: Angelina Pih Signed: 09/20/2023 4:03:25 PM By: Betha Loa Entered By: Betha Loa on 09/20/2023 10:48:33 -------------------------------------------------------------------------------- Patient/Caregiver Education Details Patient Name: Date of Service: [name], [name] [name] 12/2/2024andnbsp1:45 PM Medical Record Number: [id number] Patient Account Number: [id number] Date of Birth/Gender: Treating RN: 1946/02/24 (77 y.o. [name] Primary Care Physician: [name] Other Clinician: Betha Loa Referring Physician: Treating Physician/Extender: [name] in Treatment: 22 Education Assessment Education Provided To: Patient Education Topics Provided Wound/Skin Impairment: Handouts: Other: continue wound care as directed Methods: Explain/Verbal Responses: State content correctly Electronic Signature(s) Signed: 09/20/2023 4:03:25 PM By: Betha Loa Entered By:  Betha Loa on 09/20/2023 11:14:18 -------------------------------------------------------------------------------- Wound Assessment Details Patient Name: Date of Service: [name], [name] [name] 09/20/2023 1:45 PM Medical Record Number: [id number] Patient Account Number: [id number] Date of Birth/Sex: Treating RN: Nov 26, 1945 (77 y.o. [name] Primary Care Bell Carbo: [name] Other Clinician: Betha Loa Referring Abimbola Aki: Treating [name]/Extender: [name] Weeks in Treatment: [address] [name], [name] ([id number]) [id number] Page 7 of 8 Wound Number: 16 Primary Abrasion Etiology: Wound Location: Right Hand - 2nd Digit Wound Open Wounding Event: Blister Status: Date Acquired: 08/18/2023 Comorbid Cataracts, Arrhythmia, Coronary Artery Disease, Hypertension, Weeks Of Treatment: 2 History: Type II Diabetes, Osteoarthritis, Neuropathy Clustered Wound: No Photos Wound Measurements Length: (cm) 1 Width: (cm) 0.3 Depth: (cm) 0.1 Area: (cm) 0.236 Volume: (cm) 0.024 % Reduction in Area: 88% % Reduction in Volume: 87.8% Wound Description Classification: Full Thickness Without Exposed Sup Exudate Amount: Small Exudate Type: Serosanguineous Exudate Color: red, brown port Structures Wound Bed Granulation Amount: Medium (34-66%) Exposed Structure Granulation Quality: Red Fascia Exposed: No Necrotic Amount: Medium (34-66%) Fat Layer (Subcutaneous Tissue) Exposed: Yes Necrotic Quality: Eschar Tendon Exposed: No Muscle Exposed: No Joint Exposed: No Bone Exposed: No Treatment Notes Wound #16 (Hand - 2nd Digit) Wound Laterality: Right Cleanser Normal Saline Discharge Instruction: Wash your hands with soap and water. Remove old dressing, discard into plastic bag and place into trash. Cleanse the wound with Normal Saline prior to applying a clean dressing using  gauze sponges, not tissues or cotton balls. Do  not scrub or use excessive force. Pat dry using gauze sponges, not tissue or cotton balls. Soap and Water Discharge Instruction: Gently cleanse wound with antibacterial soap, rinse and pat dry prior to dressing wounds Peri-Wound Care Topical Primary Dressing Xeroform-HBD 2x2 (in/in) Discharge Instruction: Apply Xeroform-HBD 2x2 (in/in) as directed Secondary Dressing Coverlet Latex-Free Fabric Adhesive Dressings Discharge Instruction: 1.5 x 2 Secured With Compression Wrap Compression Stockings Add-Ons [name], [name] ([id number]) [id number] Page 8 of 8 Electronic Signature(s) Signed: 09/20/2023 3:27:26 PM By: Angelina Pih Signed: 09/20/2023 4:03:25 PM By: Betha Loa Entered By: Betha Loa on 09/20/2023 10:54:36 -------------------------------------------------------------------------------- Vitals Details Patient Name: Date of Service: [name], [name] [name] 09/20/2023 1:45 PM Medical Record Number: [id number] Patient Account Number: [id number] Date of Birth/Sex: Treating RN: [birth year] (77 y.o. [name] Primary Care Jamesia Linnen: [name] Other Clinician: Betha Loa Referring Mega Kinkade: Treating Romanita Fager/Extender: [name] Weeks in Treatment: 5 Vital Signs Time Taken: 13:46 Temperature (F): 98.0 Height (in): 70 Pulse (bpm): 83 Weight (lbs): 265 Respiratory Rate (breaths/min): 18 Body Mass Index (BMI): 38 Blood Pressure (mmHg): 146/64 Reference Range: 80 - 120 mg / dl Electronic Signature(s) Signed: 09/20/2023 4:03:25 PM By: Betha Loa Entered By: Betha Loa on 09/20/2023 10:48:28

## 2023-09-27 ENCOUNTER — Encounter: Payer: Medicare HMO | Admitting: Physician Assistant

## 2023-09-27 DIAGNOSIS — E1142 Type 2 diabetes mellitus with diabetic polyneuropathy: Secondary | ICD-10-CM | POA: Diagnosis not present

## 2023-09-27 DIAGNOSIS — Z7984 Long term (current) use of oral hypoglycemic drugs: Secondary | ICD-10-CM | POA: Diagnosis not present

## 2023-09-27 DIAGNOSIS — I480 Paroxysmal atrial fibrillation: Secondary | ICD-10-CM | POA: Diagnosis not present

## 2023-09-27 DIAGNOSIS — E11622 Type 2 diabetes mellitus with other skin ulcer: Secondary | ICD-10-CM | POA: Diagnosis not present

## 2023-09-27 DIAGNOSIS — L98492 Non-pressure chronic ulcer of skin of other sites with fat layer exposed: Secondary | ICD-10-CM | POA: Diagnosis not present

## 2023-09-27 DIAGNOSIS — L98499 Non-pressure chronic ulcer of skin of other sites with unspecified severity: Secondary | ICD-10-CM | POA: Diagnosis not present

## 2023-09-27 DIAGNOSIS — Z79891 Long term (current) use of opiate analgesic: Secondary | ICD-10-CM | POA: Diagnosis not present

## 2023-09-27 DIAGNOSIS — Z7901 Long term (current) use of anticoagulants: Secondary | ICD-10-CM | POA: Diagnosis not present

## 2023-09-27 DIAGNOSIS — S60410S Abrasion of right index finger, sequela: Secondary | ICD-10-CM | POA: Diagnosis not present

## 2023-09-27 DIAGNOSIS — Z952 Presence of prosthetic heart valve: Secondary | ICD-10-CM | POA: Diagnosis not present

## 2023-09-27 DIAGNOSIS — Z79899 Other long term (current) drug therapy: Secondary | ICD-10-CM | POA: Diagnosis not present

## 2023-09-27 NOTE — Progress Notes (Signed)
MORT, ESTOCK ([id number]) [id number] Page 1 of 2 Visit Report for 09/27/2023 Chief Complaint Document Details Patient Name: Date of Service: [name], GEO [facility] J. 09/27/2023 9:15 A M Medical Record Number: [id number] Patient Account Number: [id number] Date of Birth/Sex: Treating RN: [birth year] (77 y.o. [name] Primary Care Provider: [name] Other Clinician: Referring Provider: Treating Provider/Extender: Hermine Messick Weeks in Treatment: 29 Information Obtained from: Patient Chief Complaint Right index finger ulcer Electronic Signature(s) Signed: 09/27/2023 9:15:48 AM By: [name] Derry PA-C Entered By: [name] Derry on 09/27/2023 09:15:48 -------------------------------------------------------------------------------- Problem List Details Patient Name: Date of Service: [name], [name] 09/27/2023 9:15 A M Medical Record Number: [id number] Patient Account Number: [id number] Date of Birth/Sex: Treating RN: 1946/10/17 (77 y.o. 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

## 2023-09-30 ENCOUNTER — Ambulatory Visit: Payer: Medicare HMO | Admitting: Physician Assistant

## 2023-10-01 DIAGNOSIS — Z79899 Other long term (current) drug therapy: Secondary | ICD-10-CM | POA: Diagnosis not present

## 2023-10-01 DIAGNOSIS — I10 Essential (primary) hypertension: Secondary | ICD-10-CM | POA: Diagnosis not present

## 2023-10-01 DIAGNOSIS — E118 Type 2 diabetes mellitus with unspecified complications: Secondary | ICD-10-CM | POA: Diagnosis not present

## 2023-10-01 DIAGNOSIS — E0590 Thyrotoxicosis, unspecified without thyrotoxic crisis or storm: Secondary | ICD-10-CM | POA: Diagnosis not present

## 2023-10-01 DIAGNOSIS — E782 Mixed hyperlipidemia: Secondary | ICD-10-CM | POA: Diagnosis not present

## 2023-10-04 ENCOUNTER — Encounter: Payer: Medicare HMO | Admitting: Physician Assistant

## 2023-10-04 DIAGNOSIS — Z7901 Long term (current) use of anticoagulants: Secondary | ICD-10-CM | POA: Diagnosis not present

## 2023-10-04 DIAGNOSIS — Z952 Presence of prosthetic heart valve: Secondary | ICD-10-CM | POA: Diagnosis not present

## 2023-10-04 DIAGNOSIS — E11622 Type 2 diabetes mellitus with other skin ulcer: Secondary | ICD-10-CM | POA: Diagnosis not present

## 2023-10-04 DIAGNOSIS — Z79891 Long term (current) use of opiate analgesic: Secondary | ICD-10-CM | POA: Diagnosis not present

## 2023-10-04 DIAGNOSIS — E1142 Type 2 diabetes mellitus with diabetic polyneuropathy: Secondary | ICD-10-CM | POA: Diagnosis not present

## 2023-10-04 DIAGNOSIS — I480 Paroxysmal atrial fibrillation: Secondary | ICD-10-CM | POA: Diagnosis not present

## 2023-10-04 DIAGNOSIS — L98499 Non-pressure chronic ulcer of skin of other sites with unspecified severity: Secondary | ICD-10-CM | POA: Diagnosis not present

## 2023-10-04 DIAGNOSIS — Z7984 Long term (current) use of oral hypoglycemic drugs: Secondary | ICD-10-CM | POA: Diagnosis not present

## 2023-10-04 DIAGNOSIS — Z79899 Other long term (current) drug therapy: Secondary | ICD-10-CM | POA: Diagnosis not present

## 2023-10-06 NOTE — Progress Notes (Signed)
ALEKZANDER, KARGES ([id number]) [id number] Page 1 of 8 Visit Report for 10/04/2023 Arrival Information Details Patient Name: Date of Service: [name], [name] [facility] J. 10/04/2023 1:15 PM Medical Record Number: [id number] Patient Account Number: [id number] Date of Birth/Sex: Treating RN: [birth year], [name] (77 y.o. [name] Primary Care Mekiah Wahler: [name] Other Clinician: Referring Aries Townley: Treating Jermall Isaacson/Extender: [name] in Treatment: 105 Visit Information History Since Last Visit Added or deleted any medications: No Patient Arrived: [name] Any new allergies or adverse reactions: No Arrival Time: 13:27 Had a fall or experienced change in No Accompanied By: self activities of daily living that [name] affect Transfer Assistance: None risk of falls: Patient Identification Verified: Yes Hospitalized since last visit: No Secondary Verification Process Completed: Yes Has Dressing in Place as Prescribed: Yes Patient Requires Transmission-Based Precautions: No Pain Present Now: No Patient Has Alerts: Yes Patient Alerts: Patient on Blood Thinner Warfarin Type II Diabetic Electronic Signature(s) Signed: 10/05/2023 4:17:14 PM By: Midge Aver MSN RN CNS WTA Entered By: Midge Aver on 10/04/2023 10:28:15 -------------------------------------------------------------------------------- Clinic Level of Care Assessment Details Patient Name: Date of Service: [name], [name] [name] 10/04/2023 1:15 PM Medical Record Number: [id number] Patient Account Number: [id number] Date of Birth/Sex: Treating RN: [name] (77 y.o. [name] Primary Care Vernel Langenderfer: [name] Other Clinician: Referring Shailey Butterbaugh: Treating Jauan Wohl/Extender: [name] in Treatment: 67 Clinic Level of Care Assessment Items TOOL 4 Quantity Score X- 1 0 Use when only an EandM is performed on FOLLOW-UP visit ASSESSMENTS - Nursing  Assessment / Reassessment X- 1 10 Reassessment of Co-morbidities (includes updates in patient status) X- 1 5 Reassessment of Adherence to Treatment Plan ASSESSMENTS - Wound and Skin A ssessment / Reassessment X - Simple Wound Assessment / Reassessment - one wound 1 5 [name], [name] ([id number]) [phone] Page 2 of 8 []  - 0 Complex Wound Assessment / Reassessment - multiple wounds []  - 0 Dermatologic / Skin Assessment (not related to wound area) ASSESSMENTS - Focused Assessment []  - 0 Circumferential Edema Measurements - multi extremities []  - 0 Nutritional Assessment / Counseling / Intervention []  - 0 Lower Extremity Assessment (monofilament, tuning fork, pulses) []  - 0 Peripheral Arterial Disease Assessment (using hand held doppler) ASSESSMENTS - Ostomy and/or Continence Assessment and Care []  - 0 Incontinence Assessment and Management []  - 0 Ostomy Care Assessment and Management (repouching, etc.) PROCESS - Coordination of Care X - Simple Patient / Family Education for ongoing care 1 15 []  - 0 Complex (extensive) Patient / Family Education for ongoing care X- 1 10 Staff obtains Chiropractor, Records, T Results / Process Orders est []  - 0 Staff telephones HHA, Nursing Homes / Clarify orders / etc []  - 0 Routine Transfer to another Facility (non-emergent condition) []  - 0 Routine Hospital Admission (non-emergent condition) []  - 0 New Admissions / Manufacturing engineer / Ordering NPWT Apligraf, etc. , []  - 0 Emergency Hospital Admission (emergent condition) X- 1 10 Simple Discharge Coordination []  - 0 Complex (extensive) Discharge Coordination PROCESS - Special Needs []  - 0 Pediatric / Minor Patient Management []  - 0 Isolation Patient Management []  - 0 Hearing / Language / Visual special needs []  - 0 Assessment of Community assistance (transportation, D/C planning, etc.) []  - 0 Additional assistance / Altered mentation []  -  0 Support Surface(s) Assessment (bed, cushion, seat, etc.) INTERVENTIONS - Wound Cleansing / Measurement X - Simple Wound Cleansing - one wound 1 5 []  - 0 Complex Wound Cleansing - multiple wounds  X- 1 5 Wound Imaging (photographs - any number of wounds) []  - 0 Wound Tracing (instead of photographs) X- 1 5 Simple Wound Measurement - one wound []  - 0 Complex Wound Measurement - multiple wounds INTERVENTIONS - Wound Dressings []  - 0 Small Wound Dressing one or multiple wounds []  - 0 Medium Wound Dressing one or multiple wounds []  - 0 Large Wound Dressing one or multiple wounds []  - 0 Application of Medications - topical []  - 0 Application of Medications - injection INTERVENTIONS - Miscellaneous []  - 0 External ear exam []  - 0 Specimen Collection (cultures, biopsies, blood, body fluids, etc.) [name], [name] ([id number]) [phone].pdf Page 3 of 8 []  - 0 Specimen(s) / Culture(s) sent or taken to Lab for analysis []  - 0 Patient Transfer (multiple staff / Nurse, adult / Similar devices) []  - 0 Simple Staple / Suture removal (25 or less) []  - 0 Complex Staple / Suture removal (26 or more) []  - 0 Hypo / Hyperglycemic Management (close monitor of Blood Glucose) []  - 0 Ankle / Brachial Index (ABI) - do not check if billed separately X- 1 5 Vital Signs Has the patient been seen at the hospital within the last three years: Yes Total Score: 75 Level Of Care: New/Established - Level 2 Electronic Signature(s) Signed: 10/05/2023 4:17:14 PM By: Midge Aver MSN RN CNS WTA Entered By: Midge Aver on 10/04/2023 10:47:04 -------------------------------------------------------------------------------- Encounter Discharge Information Details Patient Name: Date of Service: [name], [name] [name] 10/04/2023 1:15 PM Medical Record Number: [id number] Patient Account Number: [id number] Date of Birth/Sex: Treating RN: [birth year] (77 y.o. [name] Primary Care  Mahealani Sulak: [name] Other Clinician: Referring Kona Yusuf: Treating Tammi Boulier/Extender: [name] in Treatment: 20 Encounter Discharge Information Items Discharge Condition: Stable Ambulatory Status: Walker Discharge Destination: Home Transportation: Private Auto Accompanied By: self Schedule Follow-up Appointment: No Clinical Summary of Care: Electronic Signature(s) Signed: 10/05/2023 4:17:14 PM By: Midge Aver MSN RN CNS WTA Entered By: Midge Aver on 10/04/2023 10:49:06 -------------------------------------------------------------------------------- Lower Extremity Assessment Details Patient Name: Date of Service: [name], [name] [name] 10/04/2023 1:15 PM Medical Record Number: [id number] Patient Account Number: [id number] Date of Birth/Sex: Treating RN: [birth year] (77 y.o. [name], [name], [name] ([id number]) [id number] Page 4 of 8 Primary Care Heliodoro Domagalski: [name] Other Clinician: Referring Almira Phetteplace: Treating Demiya Magno/Extender: [name] in Treatment: 43 Electronic Signature(s) Signed: 10/05/2023 4:17:14 PM By: Midge Aver MSN RN CNS WTA Entered By: Midge Aver on 10/04/2023 10:43:30 -------------------------------------------------------------------------------- Multi Wound Chart Details Patient Name: Date of Service: [name], [name] [name] 10/04/2023 1:15 PM Medical Record Number: [id number] Patient Account Number: [id number] Date of Birth/Sex: Treating RN: [birth year] (77 y.o. [name] Primary Care Angelicia Lessner: [name] Other Clinician: Referring Rotunda Worden: Treating Kiet Geer/Extender: Hermine Messick Weeks in Treatment: 72 Vital Signs Height(in): 70 Pulse(bpm): 82 Weight(lbs): 265 Blood Pressure(mmHg): 100/63 Body Mass Index(BMI): 38 Temperature(F): 98.2 Respiratory Rate(breaths/min): 18 [16:Photos:] [N/A:N/A] Right Hand - 2nd Digit N/A N/A Wound  Location: Blister N/A N/A Wounding Event: Abrasion N/A N/A Primary Etiology: Cataracts, Arrhythmia, Coronary N/A N/A Comorbid History: Artery Disease, Hypertension, Type II Diabetes, Osteoarthritis, Neuropathy 08/18/2023 N/A N/A Date Acquired: 4 N/A N/A Weeks of Treatment: Healed - Epithelialized N/A N/A Wound Status: No N/A N/A Wound Recurrence: 0x0x0 N/A N/A Measurements L x W x D (cm) 0 N/A N/A A (cm) : rea 0 N/A N/A Volume (cm) : 100.00% N/A N/A % Reduction in Area: 100.00% N/A N/A % Reduction  in Volume: Full Thickness Without Exposed N/A N/A Classification: Support Structures Small N/A N/A Exudate A mount: Serosanguineous N/A N/A Exudate Type: red, brown N/A N/A Exudate Color: Medium (34-66%) N/A N/A Granulation Amount: Red N/A N/A Granulation Quality: Medium (34-66%) N/A N/A Necrotic Amount: Eschar N/A N/A Necrotic Tissue: Fat Layer (Subcutaneous Tissue): Yes N/A N/A Exposed Structures: Fascia: No Tendon: No Muscle: No [name], [name] ([id number]) [phone].pdf Page 5 of 8 Joint: No Bone: No Treatment Notes Electronic Signature(s) Signed: 10/05/2023 4:17:14 PM By: Midge Aver MSN RN CNS WTA Entered By: Midge Aver on 10/04/2023 10:43:39 -------------------------------------------------------------------------------- Multi-Disciplinary Care Plan Details Patient Name: Date of Service: [name], [name] [name] 10/04/2023 1:15 PM Medical Record Number: [id number] Patient Account Number: [id number] Date of Birth/Sex: Treating RN: [name] (77 y.o. [name] Primary Care Qiana Landgrebe: [name] Other Clinician: Referring Karlie Aung: Treating Umer Harig/Extender: Hermine Messick Weeks in Treatment: 60 Active Inactive Electronic Signature(s) Signed: 10/05/2023 4:17:14 PM By: Midge Aver MSN RN CNS WTA Entered By: Midge Aver on 10/04/2023  10:47:51 -------------------------------------------------------------------------------- Pain Assessment Details Patient Name: Date of Service: [name], [name] [name] 10/04/2023 1:15 PM Medical Record Number: [id number] Patient Account Number: [id number] Date of Birth/Sex: Treating RN: [name], [name] (77 y.o. [name] Primary Care Raymie Trani: [name] Other Clinician: Referring Domitila Stetler: Treating Najee Cowens/Extender: Hermine Messick Weeks in Treatment: 60 Active Problems Location of Pain Severity and Description of Pain Patient Has Paino No Site Locations [name], [name] ([id number]) [phone].pdf Page 6 of 8 Pain Management and Medication Current Pain Management: Electronic Signature(s) Signed: 10/05/2023 4:17:14 PM By: Midge Aver MSN RN CNS WTA Entered By: Midge Aver on 10/04/2023 10:32:43 -------------------------------------------------------------------------------- Patient/Caregiver Education Details Patient Name: Date of Service: [name], [name] [name] 12/16/2024andnbsp1:15 PM Medical Record Number: [id number] Patient Account Number: [id number] Date of Birth/Gender: Treating RN: [name][birth year] (77 y.o. [name] Primary Care Physician: [name] Other Clinician: Referring Physician: Treating Physician/Extender: [name] in Treatment: 22 Education Assessment Education Provided To: Patient Education Topics Provided Discharge Packet: Methods: Explain/Verbal Responses: State content correctly Electronic Signature(s) Signed: 10/05/2023 4:17:14 PM By: Midge Aver MSN RN CNS WTA Entered By: Midge Aver on 10/04/2023 10:48:18 Barrie Folk ([id number]) [id number] Page 7 of 8 -------------------------------------------------------------------------------- Wound Assessment Details Patient Name: Date of Service: [name], [name] [name] 10/04/2023 1:15 PM Medical Record  Number: [id number] Patient Account Number: [id number] Date of Birth/Sex: Treating RN: [name] (77 y.o. [name] Primary Care Khilee Hendricksen: [name] Other Clinician: Referring Daryan Buell: Treating Clytie Shetley/Extender: Hermine Messick Weeks in Treatment: 67 Wound Status Wound Number: 16 Primary Abrasion Etiology: Wound Location: Right Hand - 2nd Digit Wound Healed - Epithelialized Wounding Event: Blister Status: Date Acquired: 08/18/2023 Comorbid Cataracts, Arrhythmia, Coronary Artery Disease, Hypertension, Weeks Of Treatment: 4 History: Type II Diabetes, Osteoarthritis, Neuropathy Clustered Wound: No Photos Wound Measurements Length: (cm) Width: (cm) Depth: (cm) Area: (cm) Volume: (cm) 0 % Reduction in Area: 100% 0 % Reduction in Volume: 100% 0 0 0 Wound Description Classification: Full Thickness Without Exposed Sup Exudate Amount: Small Exudate Type: Serosanguineous Exudate Color: red, brown port Structures Wound Bed Granulation Amount: Medium (34-66%) Exposed Structure Granulation Quality: Red Fascia Exposed: No Necrotic Amount: Medium (34-66%) Fat Layer (Subcutaneous Tissue) Exposed: Yes Necrotic Quality: Eschar Tendon Exposed: No Muscle Exposed: No Joint Exposed: No Bone Exposed: No Treatment Notes Wound #16 (Hand - 2nd Digit) Wound Laterality: Right Cleanser Peri-Wound Care Topical WEYLYN, VERCRUYSSE ([id number]) [phone].pdf Page 8 of 8 Primary  Dressing Secondary Dressing Secured With Compression Wrap Compression Stockings Add-Ons Electronic Signature(s) Signed: 10/05/2023 4:17:14 PM By: Midge Aver MSN RN CNS WTA Entered By: Midge Aver on 10/04/2023 10:43:23 -------------------------------------------------------------------------------- Vitals Details Patient Name: Date of Service: [name], [name] [name] 10/04/2023 1:15 PM Medical Record Number: [id number] Patient Account Number: [id number] Date of  Birth/Sex: Treating RN: [birth year][name] (77 y.o. [name] Primary Care Demetreus Lothamer: [name] Other Clinician: Referring Luisa Louk: Treating Azariah Bonura/Extender: Hermine Messick Weeks in Treatment: 64 Vital Signs Time Taken: 13:30 Temperature (F): 98.2 Height (in): 70 Pulse (bpm): 82 Weight (lbs): 265 Respiratory Rate (breaths/min): 18 Body Mass Index (BMI): 38 Blood Pressure (mmHg): 100/63 Reference Range: 80 - 120 mg / dl Electronic Signature(s) Signed: 10/05/2023 4:17:14 PM By: Midge Aver MSN RN CNS WTA Entered By: Midge Aver on 10/04/2023 10:32:33

## 2023-10-06 NOTE — Progress Notes (Signed)
[name], [name] ([id number]) [id number] Page 1 of 13 Visit Report for 10/04/2023 Chief Complaint Document Details Patient Name: Date of Service: [name], GEO [facility] J. 10/04/2023 1:15 PM Medical Record Number: [id number] Patient Account Number: [id number] Date of Birth/Sex: Treating RN: 1946/01/25 (77 y.o. [name] Primary Care Provider: [name] Other Clinician: Referring Provider: Treating Provider/Extender: Hermine Messick Weeks in Treatment: 48 Information Obtained from: Patient Chief Complaint Right index finger ulcer Electronic Signature(s) Signed: 10/04/2023 1:47:35 PM By: [name] Derry PA-C Entered By: [name] Derry on 10/04/2023 10:47:35 -------------------------------------------------------------------------------- HPI Details Patient Name: Date of Service: [name], [name] 10/04/2023 1:15 PM Medical Record Number: [id number] Patient Account Number: [id number] Date of Birth/Sex: Treating RN: [birth year] (77 y.o. [name] Primary Care Provider: [name] Other Clinician: Referring Provider: Treating Provider/Extender: [name] in Treatment: 66 History of Present Illness HPI Description: 09/24/2020 on evaluation today patient presents today for a heel ulcer that he tells me has been present for about 2 years. He has been seeing podiatry and they have been attempting to manage this including what sounds to be a total contact cast, Unna boot, and just standard dressings otherwise as well. Most recently has been using triple antibiotic ointment. With that being said unfortunately despite everything he really has not had any significant improvement. He tells me that he cannot even really remember exactly how this began but he presumed it may have rubbed on his shoes or something of that nature. 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 He tells me that this does seem to help. Fortunately there is no signs of active infection at this time. His most recent hemoglobin A1c was 8.1 though he knows this was this year he cannot tell me the exact time. His fluid pills currently to help with some of the lower extremity edema although he does obviously have signs of venous stasis/lymphedema. Currently there is no evidence of active infection. No fevers, chills, nausea, vomiting, or diarrhea. Patient has had fairly recent ABIs which were performed on 07/19/2020 and revealed that he has normal findings in both the ankle and toe locations bilaterally. His ABI on the right was 1.09 on the left was 1.08 with a TBI on the right of 0.88 and on the left of 0.94. Triphasic flow was noted throughout. 10/08/2020 on evaluation today patient appears to be doing pretty well in regard to his left heel currently in fact this is doing a great job and seems to be healing quite nicely. Unfortunately on his right leg he had a pile of wood that actually fell on him injuring his right leg this is somewhat erythematous has me concerned little bit about cellulitis though there is not really a good area to culture at this point. [name], PAULICK ([id number]) [id number] Page 2 of 13 10/24/2020 upon evaluation today patient appears to be doing well with regard to his heel ulcer. He is showing signs of improvement which is great news. His right leg is completely healed. Overall I feel like he is doing excellent and there is no signs of infection. 11/07/2020 upon evaluation today patient appears to be doing well with regard to his heel ulcer.  He tells me that last week when he was unable to come in his wife actually  thought that the wound was very close to closing if not closed. Then it began to "reopen again". I really feel like what may have happened as the collagen may have dried over the wound bed and that because that misunderstanding with thinking that the wound was healing. With that being said I did not see it last week I do not know that for certain. Either way I feel like he is doing great today I see no signs of infection at this point. 11/26/2020 upon inspection today patient appears to be doing decently well in regard to his heel ulcer. He has been tolerating the dressing changes without complication. Fortunately there is no sign of active infection at this time. No fevers, chills, nausea, vomiting, or diarrhea. 12/10/2020 upon evaluation today patient appears to be doing fairly well in regard to the wound on his heel as well as what appears to be a new wound of the left first metatarsal head plantar aspect. This seems to be an area that was callus that has split as the patient tells me has been trying to walk on his toes more has probably where this came from. With that being said there does not appear to be signs of active infection which is great news. 12/17/2020 upon evaluation today patient actually appears to be making good progress currently. Fortunately there is no evidence of active infection at this time. Overall I feel like he is very close to complete closure. 12/26/2020 upon evaluation today patient appears to be doing excellent in regard to his wounds. In fact I am not certain that these are not even completely healed on initial inspection. Overall I am very pleased with where things stand today. Good news is that they are healed he is actually get ready to go out of town and that will be helpful as well as he will be a full part of the time. Readmission: 02/04/2021 upon evaluation today patient appears to be doing well at this point in regard to his left heel that I previously saw him for.  Unfortunately he is having issues with his right lower extremity. He has significant wounds at this point he also has erythema noted there is definite signs of cellulitis which is unfortunate. With that being said I think we do need to address this sooner rather than later. The good news is he did have a nice trip to the beach. He tells me that he had no issues during that time. 4/27; patient on Bactrim. Culture showed methicillin sensitive staph aureus therefore the Bactrim should be effective. 2 small areas on the right leg are healed the area on the mid aspect of the tibia almost 100% covered in a very adherent necrotic debris. We have been using silver alginate 02/20/2021 upon evaluation today patient appears to be doing well with regard to his wound. He is showing signs of improvement which is great news overall very pleased with where things stand today. No fevers, chills, nausea, vomiting, or diarrhea. 02/27/2021 upon evaluation today patient appears to be doing well with regard to his leg ulcer. He is tolerating the dressing changes and overall appears to be doing quite excellent. I am extremely pleased with where things stand and overall I think patient is making great progress. There is no sign of active infection at this time which is also great news. 03/06/2021 upon evaluation today patient appears to be  doing excellent in regard to his wounds. In fact he appears to be completely healed today based on what I am seeing. This is excellent news and overall I am extremely pleased with where he stands. Overall the patient is happy to hear this as well this has been quite sometime coming. Readmission: 04/01/2021 patient unfortunately returns for readmission today. He tells me that he has been wearing his compression socks on the right leg daily and he does not really know what is going on and why his legs are doing what they are doing. We did therefore go ahead and probe deeper into exactly what has  been going on with him today. Subsequently the patient tells me that when he gets up and what we would call "first thing in the morning" are really 2 different things". He does not tend to sleep well so he tells me that he will often wake up at 3:00 in the morning. He will then potentially going into the living room to get in his chair where he may read a book for a little while and then potentially fall asleep back in his chair. He then subsequently wake up around 5:00 or so and then get up and in his words "putter around". This often will end with him proceeding at some point around 8 AM or 9 AM to putting on his compression socks. With that being said this means that anywhere from the 3:00 in the morning till roughly around 9:00 in the morning he has no compression on yet he is sitting in his recliner, walking around and up and about, and this is at least 5 to 6 hours of noncompressed time. That may be our issue here but I did not realize until we discussed this further today. Fortunately there does not appear to be any signs of active infection at this time which is great news. With that being said he has multiple wounds of the bilateral lower extremities. 04/10/2021 upon evaluation today patient appears to be doing well with regard to his legs for the most part. He does look like he had some injury where his wrap slid down nonetheless he did some "doctoring on them". I do feel like most of the areas honestly have cleared back up which is great news. There does not appear to be any signs of active infection at this time which is also great news. No fevers, chills, nausea, vomiting, or diarrhea. 04/18/2021 upon evaluation today patient appears to be doing well with regard to his wounds in fact he appears to be completely healed which is great news. Fortunately there does not appear to be any signs of active infection which is great news. No fevers, chills, nausea, vomiting, or  diarrhea. READMISSION 07/28/2021 This is a now 77 year old man we have had in this clinic for quite a bit of this year. 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 He wears compression stockings. 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 He comes in an old running shoes although he says he wears better shoes at home. His history is that he noticed a blister in the right posterior heel. This open. He has been applying Neosporin to it currently the wound measures 2 x 1.4 cm. 100% slough covered. Not really offloading this in any rigorous way. Past medical history is essentially unchanged he has paroxysmal atrial fibrillation on Coumadin type 2 diabetes with a recent  hemoglobin A1c of 7 on metformin. ABI in our clinic was 0.98 on the right 08/05/2021 upon evaluation today patient appears to be doing well with regard to his heel ulcer. Fortunately there does not appear to be any signs of active infection at this time. Overall I been very pleased with where things seem to be currently as far as the wound healing is concerned. Again this is first a lot of seeing him Dr. Leanord Hawking readmitted him last week. Nonetheless I think we are definitely making some progress here. 08/11/2021 upon evaluation today patient's wound on the heel actually showing signs of excellent improvement. I am actually very pleased with where things stand currently. No fevers, chills, nausea, vomiting, or diarrhea. There does not appear to be any need for sharp debridement today either which is also great news. 08/25/2021 upon evaluation today patient appears to be doing well with regard to his heel ulcer. This is actually looking significantly improved compared to last time I saw him. Fortunately there does not appear to be any evidence of active infection at this time. No fevers, chills, nausea, vomiting, or  diarrhea. 09/01/2021 upon evaluation today patient appears to be doing decently well in regard to his wounds. Fortunately there does not appear to be any signs of active infection at this time which is great news and overall very pleased in that regard. I do not see any evidence of active infection systemically which is great news as well. No fevers, chills, nausea, vomiting, or diarrhea. I think that the heel is making excellent progress. IRINEO, DERRING ([id number]) [id number] Page 3 of 13 09/15/2021 upon evaluation today patient's heel unfortunately is significantly worse compared to what it was previous. I do believe that at this time he would benefit from switching back to something a little bit more offloading from the cushion shoe he has right now this is more of a heel protector what I really need is a Prevalon offloading boot which I think is good to do a lot better for him than just the small heel protector. 09/23/2021 upon evaluation today patient appears to be doing a little better in regards to last week's visit. Overall I think that he is making good progress which is great news and there does not appear to be any evidence of active infection at this time. No fevers, chills, nausea, vomiting, or diarrhea. 09/30/2021 upon evaluation patient's heel is actually showing signs of good improvement which is great news. Fortunately there does not appear to be any evidence of active infection locally nor systemically at this point. No fevers, chills, nausea, vomiting, or diarrhea. 10/07/2021 upon evaluation today patient appears to be doing well currently in regard to his heel ulcer. He has been tolerating the dressing changes without complication. Fortunately I do not see any signs of active infection at this time which is great news. No fevers, chills, nausea, vomiting, or diarrhea. 12/27; wound is measuring smaller. We have been using silver alginate 10/28/2021 upon  evaluation today patient appears to be doing well currently in regard to his heel ulcer. I am actually very pleased with where things stand and I think he is making excellent progress. Fortunately I do not see any signs of active infection locally nor systemically at this point which is great news. Nonetheless I do believe that the patient would benefit from a new offloading shoe and has been using it Velcro is no longer functioning properly and he tells me that he almost tripped and fell because  of that today. Obviously I do not want him following that would be very bad. 11/11/2021 upon evaluation today patient appears to be doing excellent in regard to his wound. In fact this appears to be completely healed based on what I see currently. I do not see any signs of anything open or draining and I did double check just by clearing away some of the callus around the edges of the wound to ensure that there was nothing still open or hiding underneath. Once this was cleared away it appeared that the patient was doing significantly better at this time which is great news and there was nothing actually open. Readmission: 06-19-2022 upon evaluation today patient appears to be doing well currently in regard to his heel ulcer all things considered. He unfortunately is having a bit of breakdown in general as far as the heel is concerned not nearly as bad as what we noted last time he was here in the clinic. The good news is I do think that this should hopefully heal much more rapidly than what we noted last time. His past medical history has not changed he is on Coumadin. 07-03-2022 upon evaluation today patient appears to be doing better in regard to his heel. We will start to see some improvement here which is good news. Fortunately I do not see any evidence of infection locally or systemically at this time which is great news. No fevers, chills, nausea, vomiting, or diarrhea. 07-10-2022 upon evaluation today  patient appears to be doing well currently in regard to his wound from the callus standpoint around the edges. Unfortunately from an actual wound bed standpoint he has some deep tissue injury noted at this point. Again I am not sure exactly what is going on that is causing this pressure to the region but he is definitely gotten pressure that is occurring and causing this to not heal as effectively as what we would like to see. I discussed with him today that he may need to at night when he sleeping use a pillow up underneath his calf in order to prevent the heel from touching the bed at all. I also think that it may be beneficial for him to monitor throughout his day and make sure there is no other time when he is getting the pressure to the heel. 07-23-2022 upon evaluation today patient appears to be doing poorly currently in regard to his heel ulcer. He has been tolerating the dressing changes without complication. Unfortunately I feel like he may be infected based on what I am seeing. 07-28-2022 I did review patient's culture results which showed positive for Proteus as well as Staphylococcus both of which would be treated with the Bactrim DS that I gave him at the last visit. This is good news and hopefully means that we should be able to apply the cast today as long as the wound itself does not appear to be doing poorly. Patient's wound bed actually showed signs of doing quite well and I am very pleased with where things stand and I do not see any signs of worsening overall which is great news. 08-04-2022 upon evaluation today patient appears to be doing well currently in regard to his heel ulcer. I am actually very pleased with where things stand and I do think this is looking significantly better. With regard to his right shin that is also showing signs of improvement which is great news. 08-10-2022 upon evaluation today patient's wound on the heel actually appears  to be doing significantly  better. In regard to the right anterior shin this is showing signs of healing it might even be completely healed but I still get a monitor I cannot get anything to fill away but also could not see where there was anything actually draining at this point. I am tending to think healed but I want a monitor 1 more week before I call it for sure. 08-17-2022 upon evaluation today patient appears to be doing well currently in regard to his heel. Fortunately he is tolerating the dressing changes without complication. I do not see any signs of infection and overall I think he is making good progress here. We did get approval for the Apligraf but I think he had a $295 co-pay that would have to be paid per application. With that being said as good as he is doing right now I do not think that is necessary but is still an option depending on how things progress if we need to speed this up quite a bit. 08/24/2022; this patient has a wound on his left heel in the setting of type 2 diabetes. We have been using Prisma. He has a heel offloading boot 08-31-2022 upon evaluation today patient appears to be doing poorly in regard to his heel ulcer which is still showing signs of bruising I am just not as happy as I was when I saw him 2 weeks ago with this. He saw Dr. Leanord Hawking last week. Also did not look good at that point apparently. Nonetheless I think that the patient is still continuing to have some counterpressure getting to the wound bed unfortunately. 09-07-2022 upon evaluation today patient appears to be doing well currently in regard to his heel ulcer. He has been tolerating the dressing changes without complication. Fortunately there does not appear to be any signs of active infection locally or systemically at this time. Fortunately I do not see any evidence of active infection at this time. 09-15-2022 upon evaluation today patient appears to be doing well currently in regard to his legs which in fact appear to be  completely healed this is great news. With that being said his heel does have some erythema and warmth as well as drainage I am concerned about cellulitis at this location. 09-21-2022 upon evaluation today patient's wound is actually showing signs of being a little bit smaller but still we are not doing nearly as well as what I would like to see. Fortunately I do not see any signs of infection at this time which is great news and overall I am extremely pleased in that regard. With that being said I do think that he needs to continue to elevate his leg although the edema is under great control with a compression wrap I think switching to a different dressing topically may be beneficial. My suggestion is to switch him over to a Iodoflex dressing. 09-28-2022 upon evaluation today patient appears to be doing well with regard to his heel ulcer. This is actually showing signs of improvement which is great news and overall I am extremely pleased with where we stand today. 10-06-2022 upon evaluation today patient actually appears to be making excellent progress at this point. Fortunately there does not appear to be any signs of active infection locally nor systemically which is great news and overall I am extremely pleased with where we stand. I do feel like he is actually making some pretty good progress here as we switch to the wrap along with the  Iodoflex. 12/26; patient has a wound on the tip of his left heel. This is not a weightbearing surface. He is using a heel offloading boot and carefully offloading this at other times during the day. He is using Iodoflex and Zetuvitunder 3 layer compression 10-22-2022 upon evaluation today patient appears to be doing well currently in regard to his wound. He has been tolerating the dressing changes without complication. Fortunately there does not appear to be any signs of active infection locally nor systemically at this time. No fevers, chills, nausea, vomiting, or  diarrhea. TUVIA, ZAHNISER ([id number]) [id number] Page 4 of 13 08-30-2023 upon evaluation today patient appears to be doing well currently in regard to his heel ulcer which is showing signs of good improvement. Fortunately I do not see any evidence of infection locally nor systemically which is great news and overall I am extremely pleased with where we stand. No fevers, chills, nausea, vomiting, or diarrhea. Unfortunately he does have a wound on the side of his fifth toe where I think this did rub on the shoe. 11-05-2022 upon evaluation today patient still still showing some signs of pressure at this point and there may have even been some fluid collection at this time. Fortunately I do not see any evidence of active infection locally nor systemically which is great news and overall I am extremely pleased with where we stand. No fevers, chills, nausea, vomiting, or diarrhea. 1/25; the patient's area on the dorsal aspect of the left fifth toe is healed. We have been using Iodoflex to the area on the left heel. The lateral wound is slightly down in length. 11-19-2022 upon evaluation today patient appears to be doing well currently in regard to his wound. He has been tolerating the dressing changes without complication. Fortunately there does not appear to be any signs of infection we been using Iodoflex up to this point. 11-26-2022 upon evaluation today patient appears to be doing poorly currently in regard to his wound. He has been tolerating the dressing changes with the Iodoflex without complication. With that being said he does not have any signs of active infection systemically though locally I do feel like there is some evidence of infection here. 12-03-2022 upon evaluation today patient appears to be doing well currently in regard to his wound this is better with the Bactrim compared to last week still there was some significant slough and biofilm buildup which is going  require sharp debridement at this point. Fortunately I do not see any signs of active infection locally nor systemically which is great news. 12-11-2022 upon evaluation today patient actually appears to be doing significantly better at this time in regard to his heel. I am actually very pleased with where things stand currently. There is no signs of active infection locally or systemically at this point. 12-17-2022 upon evaluation today patient appears to be doing better as far as the overall appearance of his wound. I am actually very pleased in that regard. Fortunately there does not appear to be any signs of active infection locally nor systemically at this time. I do feel like that the patient is showing signs of excellent improvement in general. Nonetheless he needs something better for compression I think he should go ahead and see about getting compression socks. 3/7; tip of the left heel measurements are smaller he has been using Hydrofera Blue 01-07-2023 upon evaluation today patient appears to be doing well currently in regard to his wound. This has not been debrided since  I last saw him and it definitely shows that definitely needs some debridement today. Fortunately I do not see any evidence of active infection locally nor systemically which is great news. 01-14-2023 upon evaluation today patient appears to be doing well currently although he does have some erythema and warmth to the heel I feel like he may have developed an infection yet again. This is something that is been ongoing issue we have been trying to manage his foot best we could. With that being said I do think he may need to go back on the Bactrim this has done well for him in the past. 01-21-2023 upon evaluation today patient appears to be doing better in regard to his heel. He is doing very well with the antibiotics and that is made a big difference for him based on what I am seeing I am very pleased with where we stand today.  He is obviously doing extremely well which is great news. 01-28-2023 upon evaluation today patient appears to be doing well currently in regard to his wound. This is actually showing signs of excellent improvement I am actually very pleased with where we stand and I do believe that he is making good progress here. Fortunately I do not see any evidence of active infection locally nor systemically which is great news. No fevers, chills, nausea, vomiting, or diarrhea. 02-04-2023 upon evaluation today patient's wound is actually showed signs of excellent improvement I am actually very pleased with where we stand. I do not see any signs of active infection locally nor systemically which is great news and in general I do believe that we are moving in the right direction here. No fevers, chills, nausea, vomiting, or diarrhea. 02-11-2023 upon evaluation patient's wound actually showing signs of excellent improvement I am actually very pleased with where we stand I think that his pain is better there is no signs of infection we will continue to use the gentamicin and overall we are making excellent progress. 02-18-2023 upon evaluation today patient appears to be doing well currently in regard to his wound. He has been tolerating the dressing changes without complication. Fortunately I do not see any signs of active infection locally nor systemically which is great news. 02-25-2023 upon evaluation today patient's heel actually showed signs of excellent improvement. I am actually very pleased with where we stand today and I feel like that he is making really good progress. Fortunately I do not see any signs of active infection which is good news. No fevers, chills, nausea, vomiting, or diarrhea. 03-04-2023 upon evaluation today patient appears to be doing pretty well currently in regard to his heel. He has been tolerating the dressing changes without complication. Fortunately there does not appear to be any signs of  active infection locally nor systemically which is great news. No fevers, chills, nausea, vomiting, or diarrhea. 03-11-2023 upon evaluation today patient appears to be doing poorly in regard to his heel ulcer. He tells me he has been having to sleep on his back to having trouble breathing therefore I think he has been having some issues here as far as the heel is concerned it looks like he has a deep tissue injury yet again at this point. Fortunately I do not see any signs of infection which is good news but at the same time I do feel like that he is doing worse than where he was previous. I do not see any signs of active infection systemically which is great news. 03-18-2023  upon evaluation today patient appears to be doing well currently in fact the heel is significantly better compared to last week. I am extremely pleased with where we are seeing I am hopeful that he will continue to show signs of improvement going forward. 03-25-2023 upon evaluation today patient appears to be doing well currently in regard to his wounds. He is actually showing signs of improvement on the heel and I am very pleased in that regard. Fortunately I do not see any signs of active infection at this time which is great news. No fevers, chills, nausea, vomiting, or diarrhea. 04-05-2023 upon evaluation today patient's wound is actually showing signs of good improvement. Actually very pleased with where things stand today. Fortunately there does not appear to be any signs of active infection locally or systemically which is great news. 04-19-2023 upon evaluation today patient's wound actually looks the best it has in quite some time. He did have a wrap on for 2 weeks which I think is caused a little bit of irritation going to the forefoot everything else looks to be doing well the wrap did slide down a little bit as well. Fortunately I do not see any signs of infection however I do think that in the future if he has any issues  with not being able to get scheduled to see me he should definitely make an appointment at least for a nurse visit he is aware and in agreement with plan going forward. 04-26-2023 upon evaluation today patient's wound is actually significantly smaller this is great news he is making good progress in the past really 5 or 6 visits we have been seeing improvement week by week I am very pleased in that regard. 05-03-2023 upon evaluation today patient appears to be doing excellent in regard to the wound on his heel. This is actually showing signs of improvement he continues to make good progress towards complete closure which is great news. 7/23; wound continues to do nicely. Smaller with healthy epithelialization. This is actually a reopening we had already healed this area once and we discussed ACHILLES, ROZMUS ([id number]) [id number] Page 5 of 13 this this visit. 05-18-2023 upon evaluation today patient's wound actually showing signs of improvement. Fortunately I do not see any signs of active infection locally or systemically which is great news. No fevers, chills, nausea, vomiting, or diarrhea. 05-24-2023 upon evaluation today patient's wound on the heel appears to be doing quite well. Fortunately there does not appear to be any signs of active infection at this time which is great news. Fortunately I think that he is making excellent headway with the current treatment regimen. 06-01-2023 upon evaluation today patient appears to be doing excellent in regard to his wound on the heel this is actually showing signs of being smaller measuring better and looking like is doing quite well. Fortunately I do not see any signs of infection at this time. He does have a small wound on the leg which we can also need to address this looks like it was just a small blister that ruptured I think it should heal pretty quickly. 06-07-2023 upon evaluation today patient appears to be doing well  currently in regard to his wound. He has been tolerating the dressing changes without complication. Fortunately there does not appear to be any signs of active infection locally or systemically at this time. No fevers, chills, nausea, vomiting, or diarrhea. 8/26; wound near the tip of the left heel. He is a diabetic. We have  been using Hydrofera Blue under Urgo K2 lite wrap he has been in his surgical shoe although he tells me it disintegrated and he came in on a regular running shoe today. 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 He is not wearing compression stocking 06-22-2023 upon evaluation today patient appears to be doing excellent in regard to his wound on the heel which is actually significantly smaller compared to last week's evaluation. Fortunately I do not see any signs of active infection locally or systemically at this time. 06-28-2023 upon evaluation today patient appears to be doing well currently in regard to his heel which is actually showing signs of improvement. Fortunately I do not see any evidence of active infection at this time which is great news and in general I do believe that we are making headway towards complete closure which is good news. No fevers, chills, nausea, vomiting, or diarrhea. 07-05-2023 upon evaluation today patient appears to be doing well currently in regard to his heel ulcer. This is actually very close I think that being completely healed. This is great news and overall very pleased with where we stand today. 07-12-2023 upon evaluation today patient appears to be doing well currently in regard to his wound. He in fact appears to be very close to complete resolution I am very pleased that regard. However on the tip of his foot there appears to be some issue here with inflammation secondary to infection. I am very concerned about the infection at the site. 07-19-2023 upon evaluation today patient appears to actually be doing well in  regard to his heel. Fortunately I do not see any signs of worsening overall. In general I think you are making headway towards closure. 07-26-2023 upon evaluation today patient appears to be doing well currently in regard to his wound. He has in fact completely healed and looks to be doing great. I still want to keep an eye on things due to the end of his foot being red although I think this may be secondary to the Tubigrip more than anything to be honest. Fortunately I do not see any signs of active infection at this time which is great news. No fevers, chills, nausea, vomiting, or diarrhea. 08-02-2023 upon evaluation today patient appears to be doing great in regard to his heel ulcer. He has been tolerating the dressing changes without complication. Fortunately I do not see any signs of active infection locally or systemically at this time. 08-31-2023 upon evaluation today patient appears to be doing well currently in regard to his heel this is still closed and looks great unfortunately has a wound on his finger he tells me this is a blister that showed up he popped it with a needle and it is continued to give him trouble since that time. Fortunately I do not see any signs of active infection at this time which is great news. No fevers, chills, nausea, vomiting, or diarrhea. 09-09-2023 upon evaluation today patient appears to be doing well currently in regard to his heel he still needs to make sure to be put in the urea cream on this to soften up the callus but other than that seems to be doing quite well. Upon inspection patient's finger actually showing signs of improvement as well which and very pleased in regard to with that being said I do think that he needs to monitor for any signs of infection or worsening obviously in general. 09-20-2023 upon evaluation today patient  appears to be doing well currently in regard to his wound. He has been tolerating the dressing changes without complication.  Fortunately I do not see any signs of active infection locally or systemically at this time which is good news. 09-27-2023 upon evaluation today patient appears to be doing well currently in regard to his finger ulcer which is actually measuring significantly smaller. Fortunately I do not see any signs of active infection at this time which is great news. No fevers, chills, nausea, vomiting, or diarrhea. 10-04-2023 upon evaluation today patient's finger actually appears to be doing excellent today. Fortunately I do not see any signs of active infection at this time which is great news and in general I do believe there were making good headway here with regard to healing. Electronic Signature(s) Signed: 10/04/2023 2:28:45 PM By: [name] Derry PA-C Entered By: [name] Derry on 10/04/2023 11:28:44 -------------------------------------------------------------------------------- Physical Exam Details Patient Name: Date of Service: [name], [name] 10/04/2023 1:15 PM Medical Record Number: [id number] Patient Account Number: [id number] Date of Birth/Sex: Treating RN: January 15, 1946 (77 y.o. [name] Primary Care Provider: [name] Other Clinician: Referring Provider: Treating Provider/Extender: Christofher, Eastman ([id number]) [id number] Page 6 of 13 Weeks in Treatment: 9 Constitutional Well-nourished and well-hydrated in no acute distress. Respiratory normal breathing without difficulty. Psychiatric this patient is able to make decisions and demonstrates good insight into disease process. Alert and Oriented x 3. pleasant and cooperative. Notes Upon inspection patient's wound bed actually showed signs of good granulation and epithelization at this point. Fortunately I do not see any evidence of worsening and I believe that the patient is making headway towards complete closure which is great news. In fact I feel like the wound is  probably healed in regard to his finger. Electronic Signature(s) Signed: 10/04/2023 2:29:04 PM By: [name] Derry PA-C Entered By: [name] Derry on 10/04/2023 11:29:03 -------------------------------------------------------------------------------- Physician Orders Details Patient Name: Date of Service: [name], [name] 10/04/2023 1:15 PM Medical Record Number: [id number] Patient Account Number: [id number] Date of Birth/Sex: Treating RN: 16-Feb-1946 (77 y.o. [name] Primary Care Provider: [name] Other Clinician: Referring Provider: Treating Provider/Extender: [name] in Treatment: 67 The following information was scribed by: Midge Aver The information was scribed for: [name] Derry Verbal / Phone Orders: No Diagnosis Coding Discharge From [facility] Services Discharge from Wound Care Center Treatment Complete - Apply lotion to the finger three times a day Electronic Signature(s) Signed: 10/04/2023 4:20:01 PM By: [name] Derry PA-C Signed: 10/05/2023 4:17:14 PM By: Midge Aver MSN RN CNS WTA Entered By: Midge Aver on 10/04/2023 10:46:36 Prescription 10/04/2023 -------------------------------------------------------------------------------- Bebe Shaggy PA-C Patient Name: Provider: 1946-09-24 [id number] Date of Birth: NPI#LORINZO, MALLIA ([id number]) [id number] Page 7 of 13 [name] [id number] Sex: DEA #: [phone] Phone #: License #: UPN: Patient Address: [address] [facility] [place], [place] [address] [facility] [address] [place], [place] [address] [phone] Allergies tetracycline Provider's Orders Discharge from Wound Care Center Treatment Complete - Apply lotion to the finger three times a day Hand Signature: Date(s): Electronic Signature(s) Signed: 10/04/2023 4:20:01 PM By: [name] Derry PA-C Signed: 10/05/2023  4:17:14 PM By: Midge Aver MSN RN CNS WTA Entered By: Midge Aver on 10/04/2023 10:46:37 -------------------------------------------------------------------------------- Problem List Details Patient Name: Date of Service: [name], [name] 10/04/2023 1:15 PM Medical Record Number: [id number] Patient Account Number: [id number] Date of Birth/Sex:  Treating RN: 17-[name]-1946 (77 y.o. [name] Primary Care Provider: [name] Other Clinician: Referring Provider: Treating Provider/Extender: Hermine Messick Weeks in Treatment: 63 Active Problems ICD-10 Encounter Code Description Active Date MDM Diagnosis E11.622 Type 2 diabetes mellitus with other skin ulcer 06/19/2022 No Yes S60.410A Abrasion of right index finger, initial encounter 08/31/2023 No Yes E11.42 Type 2 diabetes mellitus with diabetic polyneuropathy 06/19/2022 No Yes Inactive Problems Resolved Problems ICD-10 Code Description Active Date Resolved Date L97.522 Non-pressure chronic ulcer of other part of left foot with fat layer exposed 06/19/2022 06/19/2022 Barrie Folk ([id number]) [phone].pdf Page 8 of 13 Electronic Signature(s) Signed: 10/04/2023 1:47:26 PM By: [name] Derry PA-C Entered By: [name] Derry on 10/04/2023 10:47:25 -------------------------------------------------------------------------------- Progress Note Details Patient Name: Date of Service: [name], [name] 10/04/2023 1:15 PM Medical Record Number: [id number] Patient Account Number: [id number] Date of Birth/Sex: Treating RN: 1946/02/17 (77 y.o. [name] Primary Care Provider: [name] Other Clinician: Referring Provider: Treating Provider/Extender: Hermine Messick Weeks in Treatment: 61 Subjective Chief Complaint Information obtained from Patient Right index finger ulcer History of Present Illness (HPI) 09/24/2020 on evaluation today patient presents today for a heel ulcer  that he tells me has been present for about 2 years. He has been seeing podiatry and they have been attempting to manage this including what sounds to be a total contact cast, Unna boot, and just standard dressings otherwise as well. Most recently has been using triple antibiotic ointment. With that being said unfortunately despite everything he really has not had any significant improvement. He tells me that he cannot even really remember exactly how this began but he presumed it may have rubbed on his shoes or something of that nature. 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 He tells me that this does seem to help. Fortunately there is no signs of active infection at this time. His most recent hemoglobin A1c was 8.1 though he knows this was this year he cannot tell me the exact time. His fluid pills currently to help with some of the lower extremity edema although he does obviously have signs of venous stasis/lymphedema. Currently there is no evidence of active infection. No fevers, chills, nausea, vomiting, or diarrhea. Patient has had fairly recent ABIs which were performed on 07/19/2020 and revealed that he has normal findings in both the ankle and toe locations bilaterally. His ABI on the right was 1.09 on the left was 1.08 with a TBI on the right of 0.88 and on the left of 0.94. Triphasic flow was noted throughout. 10/08/2020 on evaluation today patient appears to be doing pretty well in regard to his left heel currently in fact this is doing a great job and seems to be healing quite nicely. Unfortunately on his right leg he had a pile of wood that actually fell on him injuring his right leg this is somewhat  erythematous has me concerned little bit about cellulitis though there is not really a good area to culture at this point. 10/24/2020 upon evaluation today patient appears to be doing well with regard to his heel ulcer. He is showing signs of improvement which is great news. His right leg is completely  healed. Overall I feel like he is doing excellent and there is no signs of infection. 11/07/2020 upon evaluation today patient appears to be doing well with regard to his heel ulcer. He tells me that last week when he was unable to come in his wife actually thought that the wound was very close to closing if not closed. Then it began to "reopen again". I really feel like what may have happened as the collagen may have dried over the wound bed and that because that misunderstanding with thinking that the wound was healing. With that being said I did not see it last week I do not know that for certain. Either way I feel like he is doing great today I see no signs of infection at this point. 11/26/2020 upon inspection today patient appears to be doing decently well in regard to his heel ulcer. He has been tolerating the dressing changes without complication. Fortunately there is no sign of active infection at this time. No fevers, chills, nausea, vomiting, or diarrhea. 12/10/2020 upon evaluation today patient appears to be doing fairly well in regard to the wound on his heel as well as what appears to be a new wound of the left first metatarsal head plantar aspect. This seems to be an area that was callus that has split as the patient tells me has been trying to walk on his toes more has probably where this came from. With that being said there does not appear to be signs of active infection which is great news. 12/17/2020 upon evaluation today patient actually appears to be making good progress currently. Fortunately there is no evidence of active infection at this time. Overall I feel like he is very close to  complete closure. 12/26/2020 upon evaluation today patient appears to be doing excellent in regard to his wounds. In fact I am not certain that these are not even completely healed on initial inspection. Overall I am very pleased with where things stand today. Good news is that they are healed he is actually get ready to go out of town and that will be helpful as well as he will be a full part of the time. Readmission: 02/04/2021 upon evaluation today patient appears to be doing well at this point in regard to his left heel that I previously saw him for. Unfortunately he is having issues with his right lower extremity. He has significant wounds at this point he also has erythema noted there is definite signs of cellulitis which is unfortunate. With that being said I think we do need to address this sooner rather than later. The good news is he did have a nice trip to the beach. He tells me that he had no issues during that time. 4/27; patient on Bactrim. Culture showed methicillin sensitive staph aureus therefore the Bactrim should be effective. 2 small areas on the right leg are healed the area on the mid aspect of the tibia almost 100% covered in a very adherent necrotic debris. We have been using silver alginate BURNETT, SISTARE ([id number]) [id number].pdf Page 9 of 13 02/20/2021 upon evaluation today patient appears to be doing well with regard to his wound. He is showing signs of improvement which is great news overall very pleased with where things stand today. No fevers, chills, nausea, vomiting, or diarrhea. 02/27/2021 upon evaluation today patient appears to be doing well with regard to his leg ulcer. He is tolerating the dressing changes and overall appears to be doing quite excellent.  I am extremely pleased with where things stand and overall I think patient is making great progress. There is no sign of active infection at this time which is also great news. 03/06/2021  upon evaluation today patient appears to be doing excellent in regard to his wounds. In fact he appears to be completely healed today based on what I am seeing. This is excellent news and overall I am extremely pleased with where he stands. Overall the patient is happy to hear this as well this has been quite sometime coming. Readmission: 04/01/2021 patient unfortunately returns for readmission today. He tells me that he has been wearing his compression socks on the right leg daily and he does not really know what is going on and why his legs are doing what they are doing. We did therefore go ahead and probe deeper into exactly what has been going on with him today. Subsequently the patient tells me that when he gets up and what we would call "first thing in the morning" are really 2 different things". He does not tend to sleep well so he tells me that he will often wake up at 3:00 in the morning. He will then potentially going into the living room to get in his chair where he may read a book for a little while and then potentially fall asleep back in his chair. He then subsequently wake up around 5:00 or so and then get up and in his words "putter around". This often will end with him proceeding at some point around 8 AM or 9 AM to putting on his compression socks. With that being said this means that anywhere from the 3:00 in the morning till roughly around 9:00 in the morning he has no compression on yet he is sitting in his recliner, walking around and up and about, and this is at least 5 to 6 hours of noncompressed time. That may be our issue here but I did not realize until we discussed this further today. Fortunately there does not appear to be any signs of active infection at this time which is great news. With that being said he has multiple wounds of the bilateral lower extremities. 04/10/2021 upon evaluation today patient appears to be doing well with regard to his legs for the most part. He  does look like he had some injury where his wrap slid down nonetheless he did some "doctoring on them". I do feel like most of the areas honestly have cleared back up which is great news. There does not appear to be any signs of active infection at this time which is also great news. No fevers, chills, nausea, vomiting, or diarrhea. 04/18/2021 upon evaluation today patient appears to be doing well with regard to his wounds in fact he appears to be completely healed which is great news. Fortunately there does not appear to be any signs of active infection which is great news. No fevers, chills, nausea, vomiting, or diarrhea. READMISSION 07/28/2021 This is a now 77 year old man we have had in this clinic for quite a bit of this year. 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 He wears compression stockings. 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 He comes in an old running shoes although he says he wears better shoes at home. His history is that he noticed a blister in the right posterior heel. This open. He has been applying Neosporin  to it currently the wound measures 2 x 1.4 cm. 100% slough covered. Not really offloading this in any rigorous way. Past medical history is essentially unchanged he has paroxysmal atrial fibrillation on Coumadin type 2 diabetes with a recent hemoglobin A1c of 7 on metformin. ABI in our clinic was 0.98 on the right 08/05/2021 upon evaluation today patient appears to be doing well with regard to his heel ulcer. Fortunately there does not appear to be any signs of active infection at this time. Overall I been very pleased with where things seem to be currently as far as the wound healing is concerned. Again this is first a lot of seeing him Dr. Leanord Hawking readmitted him last week. Nonetheless I think we are definitely making some progress here. 08/11/2021 upon  evaluation today patient's wound on the heel actually showing signs of excellent improvement. I am actually very pleased with where things stand currently. No fevers, chills, nausea, vomiting, or diarrhea. There does not appear to be any need for sharp debridement today either which is also great news. 08/25/2021 upon evaluation today patient appears to be doing well with regard to his heel ulcer. This is actually looking significantly improved compared to last time I saw him. Fortunately there does not appear to be any evidence of active infection at this time. No fevers, chills, nausea, vomiting, or diarrhea. 09/01/2021 upon evaluation today patient appears to be doing decently well in regard to his wounds. Fortunately there does not appear to be any signs of active infection at this time which is great news and overall very pleased in that regard. I do not see any evidence of active infection systemically which is great news as well. No fevers, chills, nausea, vomiting, or diarrhea. I think that the heel is making excellent progress. 09/15/2021 upon evaluation today patient's heel unfortunately is significantly worse compared to what it was previous. I do believe that at this time he would benefit from switching back to something a little bit more offloading from the cushion shoe he has right now this is more of a heel protector what I really need is a Prevalon offloading boot which I think is good to do a lot better for him than just the small heel protector. 09/23/2021 upon evaluation today patient appears to be doing a little better in regards to last week's visit. Overall I think that he is making good progress which is great news and there does not appear to be any evidence of active infection at this time. No fevers, chills, nausea, vomiting, or diarrhea. 09/30/2021 upon evaluation patient's heel is actually showing signs of good improvement which is great news. Fortunately there does not appear  to be any evidence of active infection locally nor systemically at this point. No fevers, chills, nausea, vomiting, or diarrhea. 10/07/2021 upon evaluation today patient appears to be doing well currently in regard to his heel ulcer. He has been tolerating the dressing changes without complication. Fortunately I do not see any signs of active infection at this time which is great news. No fevers, chills, nausea, vomiting, or diarrhea. 12/27; wound is measuring smaller. We have been using silver alginate 10/28/2021 upon evaluation today patient appears to be doing well currently in regard to his heel ulcer. I am actually very pleased with where things stand and I think he is making excellent progress. Fortunately I do not see any signs of active infection locally nor systemically at this point which is great news. Nonetheless I do believe that  the patient would benefit from a new offloading shoe and has been using it Velcro is no longer functioning properly and he tells me that he almost tripped and fell because of that today. Obviously I do not want him following that would be very bad. 11/11/2021 upon evaluation today patient appears to be doing excellent in regard to his wound. In fact this appears to be completely healed based on what I see currently. I do not see any signs of anything open or draining and I did double check just by clearing away some of the callus around the edges of the wound to ensure that there was nothing still open or hiding underneath. Once this was cleared away it appeared that the patient was doing significantly better at this time which is great news and there was nothing actually open. Readmission: 06-19-2022 upon evaluation today patient appears to be doing well currently in regard to his heel ulcer all things considered. He unfortunately is having a bit of breakdown in general as far as the heel is concerned not nearly as bad as what we noted last time he was here in the  clinic. The good news is I do think that this should hopefully heal much more rapidly than what we noted last time. His past medical history has not changed he is on Coumadin. CHANNER, BIELAWSKI ([id number]) [id number] Page 10 of 13 07-03-2022 upon evaluation today patient appears to be doing better in regard to his heel. We will start to see some improvement here which is good news. Fortunately I do not see any evidence of infection locally or systemically at this time which is great news. No fevers, chills, nausea, vomiting, or diarrhea. 07-10-2022 upon evaluation today patient appears to be doing well currently in regard to his wound from the callus standpoint around the edges. Unfortunately from an actual wound bed standpoint he has some deep tissue injury noted at this point. Again I am not sure exactly what is going on that is causing this pressure to the region but he is definitely gotten pressure that is occurring and causing this to not heal as effectively as what we would like to see. I discussed with him today that he may need to at night when he sleeping use a pillow up underneath his calf in order to prevent the heel from touching the bed at all. I also think that it may be beneficial for him to monitor throughout his day and make sure there is no other time when he is getting the pressure to the heel. 07-23-2022 upon evaluation today patient appears to be doing poorly currently in regard to his heel ulcer. He has been tolerating the dressing changes without complication. Unfortunately I feel like he may be infected based on what I am seeing. 07-28-2022 I did review patient's culture results which showed positive for Proteus as well as Staphylococcus both of which would be treated with the Bactrim DS that I gave him at the last visit. This is good news and hopefully means that we should be able to apply the cast today as long as the wound itself does not appear to  be doing poorly. Patient's wound bed actually showed signs of doing quite well and I am very pleased with where things stand and I do not see any signs of worsening overall which is great news. 08-04-2022 upon evaluation today patient appears to be doing well currently in regard to his heel ulcer. I am actually very pleased  with where things stand and I do think this is looking significantly better. With regard to his right shin that is also showing signs of improvement which is great news. 08-10-2022 upon evaluation today patient's wound on the heel actually appears to be doing significantly better. In regard to the right anterior shin this is showing signs of healing it might even be completely healed but I still get a monitor I cannot get anything to fill away but also could not see where there was anything actually draining at this point. I am tending to think healed but I want a monitor 1 more week before I call it for sure. 08-17-2022 upon evaluation today patient appears to be doing well currently in regard to his heel. Fortunately he is tolerating the dressing changes without complication. I do not see any signs of infection and overall I think he is making good progress here. We did get approval for the Apligraf but I think he had a $295 co-pay that would have to be paid per application. With that being said as good as he is doing right now I do not think that is necessary but is still an option depending on how things progress if we need to speed this up quite a bit. 08/24/2022; this patient has a wound on his left heel in the setting of type 2 diabetes. We have been using Prisma. He has a heel offloading boot 08-31-2022 upon evaluation today patient appears to be doing poorly in regard to his heel ulcer which is still showing signs of bruising I am just not as happy as I was when I saw him 2 weeks ago with this. He saw Dr. Leanord Hawking last week. Also did not look good at that point apparently.  Nonetheless I think that the patient is still continuing to have some counterpressure getting to the wound bed unfortunately. 09-07-2022 upon evaluation today patient appears to be doing well currently in regard to his heel ulcer. He has been tolerating the dressing changes without complication. Fortunately there does not appear to be any signs of active infection locally or systemically at this time. Fortunately I do not see any evidence of active infection at this time. 09-15-2022 upon evaluation today patient appears to be doing well currently in regard to his legs which in fact appear to be completely healed this is great news. With that being said his heel does have some erythema and warmth as well as drainage I am concerned about cellulitis at this location. 09-21-2022 upon evaluation today patient's wound is actually showing signs of being a little bit smaller but still we are not doing nearly as well as what I would like to see. Fortunately I do not see any signs of infection at this time which is great news and overall I am extremely pleased in that regard. With that being said I do think that he needs to continue to elevate his leg although the edema is under great control with a compression wrap I think switching to a different dressing topically may be beneficial. My suggestion is to switch him over to a Iodoflex dressing. 09-28-2022 upon evaluation today patient appears to be doing well with regard to his heel ulcer. This is actually showing signs of improvement which is great news and overall I am extremely pleased with where we stand today. 10-06-2022 upon evaluation today patient actually appears to be making excellent progress at this point. Fortunately there does not appear to be any signs of active  infection locally nor systemically which is great news and overall I am extremely pleased with where we stand. I do feel like he is actually making some pretty good progress here as we  switch to the wrap along with the Iodoflex. 12/26; patient has a wound on the tip of his left heel. This is not a weightbearing surface. He is using a heel offloading boot and carefully offloading this at other times during the day. He is using Iodoflex and Zetuvitunder 3 layer compression 10-22-2022 upon evaluation today patient appears to be doing well currently in regard to his wound. He has been tolerating the dressing changes without complication. Fortunately there does not appear to be any signs of active infection locally nor systemically at this time. No fevers, chills, nausea, vomiting, or diarrhea. 08-30-2023 upon evaluation today patient appears to be doing well currently in regard to his heel ulcer which is showing signs of good improvement. Fortunately I do not see any evidence of infection locally nor systemically which is great news and overall I am extremely pleased with where we stand. No fevers, chills, nausea, vomiting, or diarrhea. Unfortunately he does have a wound on the side of his fifth toe where I think this did rub on the shoe. 11-05-2022 upon evaluation today patient still still showing some signs of pressure at this point and there may have even been some fluid collection at this time. Fortunately I do not see any evidence of active infection locally nor systemically which is great news and overall I am extremely pleased with where we stand. No fevers, chills, nausea, vomiting, or diarrhea. 1/25; the patient's area on the dorsal aspect of the left fifth toe is healed. We have been using Iodoflex to the area on the left heel. The lateral wound is slightly down in length. 11-19-2022 upon evaluation today patient appears to be doing well currently in regard to his wound. He has been tolerating the dressing changes without complication. Fortunately there does not appear to be any signs of infection we been using Iodoflex up to this point. 11-26-2022 upon evaluation today patient  appears to be doing poorly currently in regard to his wound. He has been tolerating the dressing changes with the Iodoflex without complication. With that being said he does not have any signs of active infection systemically though locally I do feel like there is some evidence of infection here. 12-03-2022 upon evaluation today patient appears to be doing well currently in regard to his wound this is better with the Bactrim compared to last week still there was some significant slough and biofilm buildup which is going require sharp debridement at this point. Fortunately I do not see any signs of active infection locally nor systemically which is great news. 12-11-2022 upon evaluation today patient actually appears to be doing significantly better at this time in regard to his heel. I am actually very pleased with where things stand currently. There is no signs of active infection locally or systemically at this point. 12-17-2022 upon evaluation today patient appears to be doing better as far as the overall appearance of his wound. I am actually very pleased in that regard. Fortunately there does not appear to be any signs of active infection locally nor systemically at this time. I do feel like that the patient is showing signs of excellent improvement in general. Nonetheless he needs something better for compression I think he should go ahead and see about getting compression socks. 3/7; tip of the left heel  measurements are smaller he has been using [address] ZACH, DINGER ([id number]) [id number] Page 11 of 13 01-07-2023 upon evaluation today patient appears to be doing well currently in regard to his wound. This has not been debrided since I last saw him and it definitely shows that definitely needs some debridement today. Fortunately I do not see any evidence of active infection locally nor systemically which is great news. 01-14-2023 upon evaluation today  patient appears to be doing well currently although he does have some erythema and warmth to the heel I feel like he may have developed an infection yet again. This is something that is been ongoing issue we have been trying to manage his foot best we could. With that being said I do think he may need to go back on the Bactrim this has done well for him in the past. 01-21-2023 upon evaluation today patient appears to be doing better in regard to his heel. He is doing very well with the antibiotics and that is made a big difference for him based on what I am seeing I am very pleased with where we stand today. He is obviously doing extremely well which is great news. 01-28-2023 upon evaluation today patient appears to be doing well currently in regard to his wound. This is actually showing signs of excellent improvement I am actually very pleased with where we stand and I do believe that he is making good progress here. Fortunately I do not see any evidence of active infection locally nor systemically which is great news. No fevers, chills, nausea, vomiting, or diarrhea. 02-04-2023 upon evaluation today patient's wound is actually showed signs of excellent improvement I am actually very pleased with where we stand. I do not see any signs of active infection locally nor systemically which is great news and in general I do believe that we are moving in the right direction here. No fevers, chills, nausea, vomiting, or diarrhea. 02-11-2023 upon evaluation patient's wound actually showing signs of excellent improvement I am actually very pleased with where we stand I think that his pain is better there is no signs of infection we will continue to use the gentamicin and overall we are making excellent progress. 02-18-2023 upon evaluation today patient appears to be doing well currently in regard to his wound. He has been tolerating the dressing changes without complication. Fortunately I do not see any signs of  active infection locally nor systemically which is great news. 02-25-2023 upon evaluation today patient's heel actually showed signs of excellent improvement. I am actually very pleased with where we stand today and I feel like that he is making really good progress. Fortunately I do not see any signs of active infection which is good news. No fevers, chills, nausea, vomiting, or diarrhea. 03-04-2023 upon evaluation today patient appears to be doing pretty well currently in regard to his heel. He has been tolerating the dressing changes without complication. Fortunately there does not appear to be any signs of active infection locally nor systemically which is great news. No fevers, chills, nausea, vomiting, or diarrhea. 03-11-2023 upon evaluation today patient appears to be doing poorly in regard to his heel ulcer. He tells me he has been having to sleep on his back to having trouble breathing therefore I think he has been having some issues here as far as the heel is concerned it looks like he has a deep tissue injury yet again at this point. Fortunately I do not see any signs  of infection which is good news but at the same time I do feel like that he is doing worse than where he was previous. I do not see any signs of active infection systemically which is great news. 03-18-2023 upon evaluation today patient appears to be doing well currently in fact the heel is significantly better compared to last week. I am extremely pleased with where we are seeing I am hopeful that he will continue to show signs of improvement going forward. 03-25-2023 upon evaluation today patient appears to be doing well currently in regard to his wounds. He is actually showing signs of improvement on the heel and I am very pleased in that regard. Fortunately I do not see any signs of active infection at this time which is great news. No fevers, chills, nausea, vomiting, or diarrhea. 04-05-2023 upon evaluation today patient's wound  is actually showing signs of good improvement. Actually very pleased with where things stand today. Fortunately there does not appear to be any signs of active infection locally or systemically which is great news. 04-19-2023 upon evaluation today patient's wound actually looks the best it has in quite some time. He did have a wrap on for 2 weeks which I think is caused a little bit of irritation going to the forefoot everything else looks to be doing well the wrap did slide down a little bit as well. Fortunately I do not see any signs of infection however I do think that in the future if he has any issues with not being able to get scheduled to see me he should definitely make an appointment at least for a nurse visit he is aware and in agreement with plan going forward. 04-26-2023 upon evaluation today patient's wound is actually significantly smaller this is great news he is making good progress in the past really 5 or 6 visits we have been seeing improvement week by week I am very pleased in that regard. 05-03-2023 upon evaluation today patient appears to be doing excellent in regard to the wound on his heel. This is actually showing signs of improvement he continues to make good progress towards complete closure which is great news. 7/23; wound continues to do nicely. Smaller with healthy epithelialization. This is actually a reopening we had already healed this area once and we discussed this this visit. 05-18-2023 upon evaluation today patient's wound actually showing signs of improvement. Fortunately I do not see any signs of active infection locally or systemically which is great news. No fevers, chills, nausea, vomiting, or diarrhea. 05-24-2023 upon evaluation today patient's wound on the heel appears to be doing quite well. Fortunately there does not appear to be any signs of active infection at this time which is great news. Fortunately I think that he is making excellent headway with the current  treatment regimen. 06-01-2023 upon evaluation today patient appears to be doing excellent in regard to his wound on the heel this is actually showing signs of being smaller measuring better and looking like is doing quite well. Fortunately I do not see any signs of infection at this time. He does have a small wound on the leg which we can also need to address this looks like it was just a small blister that ruptured I think it should heal pretty quickly. 06-07-2023 upon evaluation today patient appears to be doing well currently in regard to his wound. He has been tolerating the dressing changes without complication. Fortunately there does not appear to be any signs  of active infection locally or systemically at this time. No fevers, chills, nausea, vomiting, or diarrhea. 8/26; wound near the tip of the left heel. He is a diabetic. We have been using Hydrofera Blue under Urgo K2 lite wrap he has been in his surgical shoe although he tells me it disintegrated and he came in on a regular running shoe today. 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 He is not wearing compression stocking 06-22-2023 upon evaluation today patient appears to be doing excellent in regard to his wound on the heel which is actually significantly smaller compared to last week's evaluation. Fortunately I do not see any signs of active infection locally or systemically at this time. 06-28-2023 upon evaluation today patient appears to be doing well currently in regard to his heel which is actually showing signs of improvement. Fortunately I do not see any evidence of active infection at this time which is great news and in general I do believe that we are making headway towards complete closure which is good news. No fevers, chills, nausea, vomiting, or diarrhea. 07-05-2023 upon evaluation today patient appears to be doing well currently in regard to his heel ulcer. This is actually very close I think that being  completely healed. This is great news and overall very pleased with where we stand today. 07-12-2023 upon evaluation today patient appears to be doing well currently in regard to his wound. He in fact appears to be very close to complete resolution I ANTHONEE, BORDEN ([id number]) [phone].pdf Page 12 of 13 am very pleased that regard. However on the tip of his foot there appears to be some issue here with inflammation secondary to infection. I am very concerned about the infection at the site. 07-19-2023 upon evaluation today patient appears to actually be doing well in regard to his heel. Fortunately I do not see any signs of worsening overall. In general I think you are making headway towards closure. 07-26-2023 upon evaluation today patient appears to be doing well currently in regard to his wound. He has in fact completely healed and looks to be doing great. I still want to keep an eye on things due to the end of his foot being red although I think this may be secondary to the Tubigrip more than anything to be honest. Fortunately I do not see any signs of active infection at this time which is great news. No fevers, chills, nausea, vomiting, or diarrhea. 08-02-2023 upon evaluation today patient appears to be doing great in regard to his heel ulcer. He has been tolerating the dressing changes without complication. Fortunately I do not see any signs of active infection locally or systemically at this time. 08-31-2023 upon evaluation today patient appears to be doing well currently in regard to his heel this is still closed and looks great unfortunately has a wound on his finger he tells me this is a blister that showed up he popped it with a needle and it is continued to give him trouble since that time. Fortunately I do not see any signs of active infection at this time which is great news. No fevers, chills, nausea, vomiting, or diarrhea. 09-09-2023 upon evaluation today  patient appears to be doing well currently in regard to his heel he still needs to make sure to be put in the urea cream on this to soften up the callus but other than that seems to be doing quite well. Upon inspection patient's finger  actually showing signs of improvement as well which and very pleased in regard to with that being said I do think that he needs to monitor for any signs of infection or worsening obviously in general. 09-20-2023 upon evaluation today patient appears to be doing well currently in regard to his wound. He has been tolerating the dressing changes without complication. Fortunately I do not see any signs of active infection locally or systemically at this time which is good news. 09-27-2023 upon evaluation today patient appears to be doing well currently in regard to his finger ulcer which is actually measuring significantly smaller. Fortunately I do not see any signs of active infection at this time which is great news. No fevers, chills, nausea, vomiting, or diarrhea. 10-04-2023 upon evaluation today patient's finger actually appears to be doing excellent today. Fortunately I do not see any signs of active infection at this time which is great news and in general I do believe there were making good headway here with regard to healing. Objective Constitutional Well-nourished and well-hydrated in no acute distress. Vitals Time Taken: 1:30 PM, Height: 70 in, Weight: 265 lbs, BMI: 38, Temperature: 98.2 F, Pulse: 82 bpm, Respiratory Rate: 18 breaths/min, Blood Pressure: 100/63 mmHg. Respiratory normal breathing without difficulty. Psychiatric this patient is able to make decisions and demonstrates good insight into disease process. Alert and Oriented x 3. pleasant and cooperative. General Notes: Upon inspection patient's wound bed actually showed signs of good granulation and epithelization at this point. Fortunately I do not see any evidence of worsening and I believe that  the patient is making headway towards complete closure which is great news. In fact I feel like the wound is probably healed in regard to his finger. Integumentary (Hair, Skin) Wound #16 status is Healed - Epithelialized. Original cause of wound was Blister. The date acquired was: 08/18/2023. The wound has been in treatment 4 weeks. The wound is located on the Right Hand - 2nd Digit. The wound measures 0cm length x 0cm width x 0cm depth; 0cm^2 area and 0cm^3 volume. There is Fat Layer (Subcutaneous Tissue) exposed. There is a small amount of serosanguineous drainage noted. There is medium (34-66%) red granulation within the wound bed. There is a medium (34-66%) amount of necrotic tissue within the wound bed including Eschar. Assessment Active Problems ICD-10 Type 2 diabetes mellitus with other skin ulcer Abrasion of right index finger, initial encounter Type 2 diabetes mellitus with diabetic polyneuropathy Plan Discharge From [facility] Services: Discharge from Wound Care Center Treatment Complete - Apply lotion to the finger three times a day KEELEN, NILO ([phone]) [phone].pdf Page 13 of 13 1. R[place]a go ahead and discontinue wound care services at this time. The patient is in agreement with that plan. 2. I am going to recommend AandD ointment or CeraVe be applied to the wound 3 times per day I think this needs to be done regularly he is in agreement with that plan. We will see the patient back for follow-up visit as needed. Electronic Signature(s) Signed: 10/04/2023 2:29:43 PM By: [name] Derry PA-C Entered By: [name] Derry on 10/04/2023 11:29:43 -------------------------------------------------------------------------------- SuperBill Details Patient Name: Date of Service: [name], [name] 10/04/2023 Medical Record Number: [id number] Patient Account Number: [id number] Date of Birth/Sex: Treating RN: [birth year] (77 y.o. [name] Primary Care Provider:  [name] Other Clinician: Referring Provider: Treating Provider/Extender: Hermine Messick Weeks in Treatment: 67 Diagnosis Coding ICD-10 Codes Code Description E11.622 Type 2 diabetes mellitus with other skin  ulcer S60.410A Abrasion of right index finger, initial encounter E11.42 Type 2 diabetes mellitus with diabetic polyneuropathy Facility Procedures : CPT4 Code: [id number] Description: 54098 - WOUND CARE VISIT-LEV 2 EST PT Modifier: Quantity: 1 Physician Procedures : CPT4 Code Description Modifier [id number] - WC PHYS LEVEL 3 - EST PT ICD-10 Diagnosis Description E11.622 Type 2 diabetes mellitus with other skin ulcer S60.410A Abrasion of right index finger, initial encounter E11.42 Type 2 diabetes mellitus with  diabetic polyneuropathy Quantity: 1 Electronic Signature(s) Signed: 10/04/2023 2:29:54 PM By: [name] Derry PA-C Entered By: [name] Derry on 10/04/2023 11:29:54

## 2023-10-08 DIAGNOSIS — E782 Mixed hyperlipidemia: Secondary | ICD-10-CM | POA: Diagnosis not present

## 2023-10-08 DIAGNOSIS — E0590 Thyrotoxicosis, unspecified without thyrotoxic crisis or storm: Secondary | ICD-10-CM | POA: Diagnosis not present

## 2023-10-08 DIAGNOSIS — I480 Paroxysmal atrial fibrillation: Secondary | ICD-10-CM | POA: Diagnosis not present

## 2023-10-08 DIAGNOSIS — E119 Type 2 diabetes mellitus without complications: Secondary | ICD-10-CM | POA: Diagnosis not present

## 2023-10-08 DIAGNOSIS — I10 Essential (primary) hypertension: Secondary | ICD-10-CM | POA: Diagnosis not present

## 2023-10-08 DIAGNOSIS — Z79899 Other long term (current) drug therapy: Secondary | ICD-10-CM | POA: Diagnosis not present

## 2023-10-11 DIAGNOSIS — E538 Deficiency of other specified B group vitamins: Secondary | ICD-10-CM | POA: Diagnosis not present

## 2023-10-11 DIAGNOSIS — Z952 Presence of prosthetic heart valve: Secondary | ICD-10-CM | POA: Diagnosis not present

## 2023-10-19 DIAGNOSIS — G894 Chronic pain syndrome: Secondary | ICD-10-CM | POA: Diagnosis not present

## 2023-10-19 DIAGNOSIS — M47896 Other spondylosis, lumbar region: Secondary | ICD-10-CM | POA: Diagnosis not present

## 2023-10-21 ENCOUNTER — Other Ambulatory Visit: Payer: Self-pay | Admitting: Neurology

## 2023-10-22 DIAGNOSIS — Z01810 Encounter for preprocedural cardiovascular examination: Secondary | ICD-10-CM | POA: Diagnosis not present

## 2023-10-22 DIAGNOSIS — I480 Paroxysmal atrial fibrillation: Secondary | ICD-10-CM | POA: Diagnosis not present

## 2023-10-22 DIAGNOSIS — R918 Other nonspecific abnormal finding of lung field: Secondary | ICD-10-CM | POA: Diagnosis not present

## 2023-10-22 DIAGNOSIS — Z7901 Long term (current) use of anticoagulants: Secondary | ICD-10-CM | POA: Diagnosis not present

## 2023-10-25 DIAGNOSIS — E0590 Thyrotoxicosis, unspecified without thyrotoxic crisis or storm: Secondary | ICD-10-CM | POA: Diagnosis not present

## 2023-10-26 ENCOUNTER — Encounter: Payer: PPO | Attending: Physician Assistant | Admitting: Physician Assistant

## 2023-10-26 DIAGNOSIS — E11621 Type 2 diabetes mellitus with foot ulcer: Secondary | ICD-10-CM | POA: Diagnosis not present

## 2023-10-26 DIAGNOSIS — E1142 Type 2 diabetes mellitus with diabetic polyneuropathy: Secondary | ICD-10-CM | POA: Diagnosis not present

## 2023-10-26 DIAGNOSIS — L89621 Pressure ulcer of left heel, stage 1: Secondary | ICD-10-CM | POA: Diagnosis not present

## 2023-10-26 DIAGNOSIS — L97422 Non-pressure chronic ulcer of left heel and midfoot with fat layer exposed: Secondary | ICD-10-CM | POA: Insufficient documentation

## 2023-10-26 NOTE — Progress Notes (Addendum)
 KHAZA, BLANSETT ([id number]) [id number] Page 1 of 15 Visit Report for 10/26/2023 Chief Complaint Document Details Patient Name: Date of Service: [name], [name] [name]. 10/26/2023 1:00 PM Medical Record Number: [id number] Patient Account Number: [id number] Date of Birth/Sex: Treating RN: [birth year] (78 y.o. [name] Primary Care Provider: Auston Purchase Other Clinician: Purcell Sniff Referring Provider: Treating Provider/Extender: Bethena Andre Auston Purchase Weeks in Treatment: 62 Information Obtained from: Patient Chief Complaint Left heel ulcer Electronic Signature(s) Signed: 10/26/2023 5:32:45 PM By: Bethena Andre PA-C Previous Signature: 10/26/2023 1:07:44 PM Version By: Bethena Andre PA-C Entered By: Bethena Andre on 10/26/2023 14:32:45 -------------------------------------------------------------------------------- Debridement Details Patient Name: Date of Service: [name], [name] [name]. 10/26/2023 1:00 PM Medical Record Number: [id number] Patient Account Number: [id number] Date of Birth/Sex: Treating RN: [birth year] (78 y.o. [name] Primary Care Provider: Auston Purchase Other Clinician: Purcell Sniff Referring Provider: Treating Provider/Extender: Bethena Andre Auston Purchase Weeks in Treatment: 70 Debridement Performed for Assessment: Wound #17 Left Calcaneus Performed By: Physician Bethena Andre, PA-C The following information was scribed by: Purcell Sniff The information was scribed for: Bethena Andre Debridement Type: Debridement Level of Consciousness (Pre-procedure): Awake and Alert Pre-procedure Verification/Time Out Yes - 13:40 Taken: Start Time: 13:40 Percent of Wound Bed Debrided: 100% T Area Debrided (cm): otal 0.71 Tissue and other material debrided: Viable, Non-Viable, Callus, Slough, Subcutaneous, Slough Level: Skin/Subcutaneous Tissue Debridement Description: Excisional Instrument: Curette Bleeding: Moderate Hemostasis  Achieved: Silver Nitrate Response to Treatment: Procedure was tolerated well [name], [name] ([id number]) [id number] Page 2 of 15 Level of Consciousness (Post- Awake and Alert procedure): Post Debridement Measurements of Total Wound Length: (cm) 0.6 Stage: Category/Stage I Width: (cm) 1.5 Depth: (cm) 0.1 Volume: (cm) 0.071 Character of Wound/Ulcer Post Debridement: Stable Post Procedure Diagnosis Same as Pre-procedure Notes one silver nitrate stick used to stop bleeding Electronic Signature(s) Signed: 10/27/2023 5:32:36 PM By: [name] Blossom MSN RN CNS WTA Signed: 10/27/2023 5:49:46 PM By: Purcell Sniff Signed: 10/27/2023 6:16:28 PM By: Bethena Andre PA-C Entered By: Purcell Sniff on 10/26/2023 10:44:41 -------------------------------------------------------------------------------- HPI Details Patient Name: Date of Service: [name], [name] [name]. 10/26/2023 1:00 PM Medical Record Number: [id number] Patient Account Number: [id number] Date of Birth/Sex: Treating RN: 1946/09/06 (78 y.o. [name] Primary Care Provider: Auston Purchase Other Clinician: Purcell Sniff Referring Provider: Treating Provider/Extender: Bethena Andre Auston Purchase Weeks in Treatment: 51 History of Present Illness HPI Description: 09/24/2020 on evaluation today patient presents today for a heel ulcer that he tells me has been present for about 2 years. He has been seeing podiatry and they have been attempting to manage this including what sounds to be a total contact cast, Unna boot, and just standard dressings otherwise as well. Most recently has been using triple antibiotic ointment. With that being said unfortunately despite everything he really has not had any significant improvement. He tells me that he cannot even really remember exactly how this began but he presumed it may have rubbed on his shoes or something of that nature. 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 He also does have chronic pain in the way of neuropathy which he takes medications for including Cymbalta  and methadone . He tells me that this does seem to help. Fortunately there is no signs of active infection at this time. His most recent hemoglobin A1c was 8.1  though he knows this was this year he cannot tell me the exact time. His fluid pills currently to help with some of the lower extremity edema although he does obviously have signs of venous stasis/lymphedema. Currently there is no evidence of active infection. No fevers, chills, nausea, vomiting, or diarrhea. Patient has had fairly recent ABIs which were performed on 07/19/2020 and revealed that he has normal findings in both the ankle and toe locations bilaterally. His ABI on the right was 1.09 on the left was 1.08 with a TBI on the right of 0.88 and on the left of 0.94. Triphasic flow was noted throughout. 10/08/2020 on evaluation today patient appears to be doing pretty well in regard to his left heel currently in fact this is doing a great job and seems to be healing quite nicely. Unfortunately on his right leg he had a pile of wood that actually fell on him injuring his right leg this is somewhat erythematous has me concerned little bit about cellulitis though there is not really a good area to culture at this point. 10/24/2020 upon evaluation today patient appears to be doing well with regard to his heel ulcer. He is showing signs of improvement which is great news. His right leg is completely healed. Overall I feel like he is doing excellent and there is no signs of infection. 11/07/2020 upon evaluation today patient appears to be doing well with regard to his heel ulcer. He tells me that last week when he was unable to come in his wife actually thought that the wound was very close to  closing if not closed. Then it began to reopen again. I really feel like what may have happened as the collagen may have dried over the wound bed and that because that misunderstanding with thinking that the wound was healing. With that being said I did not see it last week I do not know that for certain. Either way I feel like he is doing great today I see no signs of infection at this point. 11/26/2020 upon inspection today patient appears to be doing decently well in regard to his heel ulcer. He has been tolerating the dressing changes without complication. Fortunately there is no sign of active infection at this time. No fevers, chills, nausea, vomiting, or diarrhea. 12/10/2020 upon evaluation today patient appears to be doing fairly well in regard to the wound on his heel as well as what appears to be a new wound of the left first metatarsal head plantar aspect. This seems to be an area that was callus that has split as the patient tells me has been trying to walk on his toes more has probably where this came from. With that being said there does not appear to be signs of active infection which is great news. [name], [name] ([id number]) [id number] Page 3 of 15 12/17/2020 upon evaluation today patient actually appears to be making good progress currently. Fortunately there is no evidence of active infection at this time. Overall I feel like he is very close to complete closure. 12/26/2020 upon evaluation today patient appears to be doing excellent in regard to his wounds. In fact I am not certain that these are not even completely healed on initial inspection. Overall I am very pleased with where things stand today. Good news is that they are healed he is actually get ready to go out of town and that will be helpful as well as he will be a full part of the time.  Readmission: 02/04/2021 upon evaluation today patient appears to be doing well at this point in regard to his  left heel that I previously saw him for. Unfortunately he is having issues with his right lower extremity. He has significant wounds at this point he also has erythema noted there is definite signs of cellulitis which is unfortunate. With that being said I think we do need to address this sooner rather than later. The good news is he did have a nice trip to the beach. He tells me that he had no issues during that time. 4/27; patient on Bactrim . Culture showed methicillin sensitive staph aureus therefore the Bactrim  should be effective. 2 small areas on the right leg are healed the area on the mid aspect of the tibia almost 100% covered in a very adherent necrotic debris. We have been using silver alginate 02/20/2021 upon evaluation today patient appears to be doing well with regard to his wound. He is showing signs of improvement which is great news overall very pleased with where things stand today. No fevers, chills, nausea, vomiting, or diarrhea. 02/27/2021 upon evaluation today patient appears to be doing well with regard to his leg ulcer. He is tolerating the dressing changes and overall appears to be doing quite excellent. I am extremely pleased with where things stand and overall I think patient is making great progress. There is no sign of active infection at this time which is also great news. 03/06/2021 upon evaluation today patient appears to be doing excellent in regard to his wounds. In fact he appears to be completely healed today based on what I am seeing. This is excellent news and overall I am extremely pleased with where he stands. Overall the patient is happy to hear this as well this has been quite sometime coming. Readmission: 04/01/2021 patient unfortunately returns for readmission today. He tells me that he has been wearing his compression socks on the right leg daily and he does not really know what is going on and why his legs are doing what they are doing. We did therefore go  ahead and probe deeper into exactly what has been going on with him today. Subsequently the patient tells me that when he gets up and what we would call first thing in the morning are really 2 different things. He does not tend to sleep well so he tells me that he will often wake up at 3:00 in the morning. He will then potentially going into the living room to get in his chair where he may read a book for a little while and then potentially fall asleep back in his chair. He then subsequently wake up around 5:00 or so and then get up and in his words putter around. This often will end with him proceeding at some point around 8 AM or 9 AM to putting on his compression socks. With that being said this means that anywhere from the 3:00 in the morning till roughly around 9:00 in the morning he has no compression on yet he is sitting in his recliner, walking around and up and about, and this is at least 5 to 6 hours of noncompressed time. That may be our issue here but I did not realize until we discussed this further today. Fortunately there does not appear to be any signs of active infection at this time which is great news. With that being said he has multiple wounds of the bilateral lower extremities. 04/10/2021 upon evaluation today patient appears to  be doing well with regard to his legs for the most part. He does look like he had some injury where his wrap slid down nonetheless he did some doctoring on them. I do feel like most of the areas honestly have cleared back up which is great news. There does not appear to be any signs of active infection at this time which is also great news. No fevers, chills, nausea, vomiting, or diarrhea. 04/18/2021 upon evaluation today patient appears to be doing well with regard to his wounds in fact he appears to be completely healed which is great news. Fortunately there does not appear to be any signs of active infection which is great news. No fevers, chills,  nausea, vomiting, or diarrhea. READMISSION 07/28/2021 This is a now 78 year old man we have had in this clinic for quite a bit of this year. 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 He wears compression stockings. 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 He comes in an old running shoes although he says he wears better shoes at home. His history is that he noticed a blister in the right posterior heel. This open. He has been applying Neosporin to it currently the wound measures 2 x 1.4 cm. 100% slough covered. Not really offloading this in any rigorous way. Past medical history is essentially unchanged he has paroxysmal atrial fibrillation on Coumadin  type 2 diabetes with a recent hemoglobin A1c of 7 on metformin . ABI in our clinic was 0.98 on the right 08/05/2021 upon evaluation today patient appears to be doing well with regard to his heel ulcer. Fortunately there does not appear to be any signs of active infection at this time. Overall I been very pleased with where things seem to be currently as far as the wound healing is concerned. Again this is first a lot of seeing him Dr. Rufus readmitted him last week. Nonetheless I think we are definitely making some progress here. 08/11/2021 upon evaluation today patient's wound on the heel actually showing signs of excellent improvement. I am actually very pleased with where things stand currently. No fevers, chills, nausea, vomiting, or diarrhea. There does not appear to be any need for sharp debridement today either which is also great news. 08/25/2021 upon evaluation today patient appears to be doing well with regard to his heel ulcer. This is actually looking significantly improved compared to last time I saw him. Fortunately there does not appear to be any evidence of active infection at this time. No fevers, chills, nausea, vomiting,  or diarrhea. 09/01/2021 upon evaluation today patient appears to be doing decently well in regard to his wounds. Fortunately there does not appear to be any signs of active infection at this time which is great news and overall very pleased in that regard. I do not see any evidence of active infection systemically which is great news as well. No fevers, chills, nausea, vomiting, or diarrhea. I think that the heel is making excellent progress. 09/15/2021 upon evaluation today patient's heel unfortunately is significantly worse compared to what it was previous. I do believe that at this time he would benefit from switching back to something a little bit more offloading from the cushion shoe he has right now this is more of a heel protector what I really need is a Prevalon offloading boot which I think is good to do a lot better for him than just the small  heel protector. 09/23/2021 upon evaluation today patient appears to be doing a little better in regards to last week's visit. Overall I think that he is making good progress which is great news and there does not appear to be any evidence of active infection at this time. No fevers, chills, nausea, vomiting, or diarrhea. 09/30/2021 upon evaluation patient's heel is actually showing signs of good improvement which is great news. Fortunately there does not appear to be any evidence of active infection locally nor systemically at this point. No fevers, chills, nausea, vomiting, or diarrhea. 10/07/2021 upon evaluation today patient appears to be doing well currently in regard to his heel ulcer. He has been tolerating the dressing changes without complication. Fortunately I do not see any signs of active infection at this time which is great news. No fevers, chills, nausea, vomiting, or diarrhea. 12/27; wound is measuring smaller. We have been using silver alginate [name], [name] ([id number]) [id number] Page 4 of 15 10/28/2021  upon evaluation today patient appears to be doing well currently in regard to his heel ulcer. I am actually very pleased with where things stand and I think he is making excellent progress. Fortunately I do not see any signs of active infection locally nor systemically at this point which is great news. Nonetheless I do believe that the patient would benefit from a new offloading shoe and has been using it Velcro is no longer functioning properly and he tells me that he almost tripped and fell because of that today. Obviously I do not want him following that would be very bad. 11/11/2021 upon evaluation today patient appears to be doing excellent in regard to his wound. In fact this appears to be completely healed based on what I see currently. I do not see any signs of anything open or draining and I did double check just by clearing away some of the callus around the edges of the wound to ensure that there was nothing still open or hiding underneath. Once this was cleared away it appeared that the patient was doing significantly better at this time which is great news and there was nothing actually open. Readmission: 06-19-2022 upon evaluation today patient appears to be doing well currently in regard to his heel ulcer all things considered. He unfortunately is having a bit of breakdown in general as far as the heel is concerned not nearly as bad as what we noted last time he was here in the clinic. The good news is I do think that this should hopefully heal much more rapidly than what we noted last time. His past medical history has not changed he is on Coumadin . 07-03-2022 upon evaluation today patient appears to be doing better in regard to his heel. We will start to see some improvement here which is good news. Fortunately I do not see any evidence of infection locally or systemically at this time which is great news. No fevers, chills, nausea, vomiting, or diarrhea. 07-10-2022 upon evaluation today  patient appears to be doing well currently in regard to his wound from the callus standpoint around the edges. Unfortunately from an actual wound bed standpoint he has some deep tissue injury noted at this point. Again I am not sure exactly what is going on that is causing this pressure to the region but he is definitely gotten pressure that is occurring and causing this to not heal as effectively as what we would like to see. I discussed with him today that he may need  to at night when he sleeping use a pillow up underneath his calf in order to prevent the heel from touching the bed at all. I also think that it may be beneficial for him to monitor throughout his day and make sure there is no other time when he is getting the pressure to the heel. 07-23-2022 upon evaluation today patient appears to be doing poorly currently in regard to his heel ulcer. He has been tolerating the dressing changes without complication. Unfortunately I feel like he may be infected based on what I am seeing. 07-28-2022 I did review patient's culture results which showed positive for Proteus as well as Staphylococcus both of which would be treated with the Bactrim  DS that I gave him at the last visit. This is good news and hopefully means that we should be able to apply the cast today as long as the wound itself does not appear to be doing poorly. Patient's wound bed actually showed signs of doing quite well and I am very pleased with where things stand and I do not see any signs of worsening overall which is great news. 08-04-2022 upon evaluation today patient appears to be doing well currently in regard to his heel ulcer. I am actually very pleased with where things stand and I do think this is looking significantly better. With regard to his right shin that is also showing signs of improvement which is great news. 08-10-2022 upon evaluation today patient's wound on the heel actually appears to be doing significantly  better. In regard to the right anterior shin this is showing signs of healing it might even be completely healed but I still get a monitor I cannot get anything to fill away but also could not see where there was anything actually draining at this point. I am tending to think healed but I want a monitor 1 more week before I call it for sure. 08-17-2022 upon evaluation today patient appears to be doing well currently in regard to his heel. Fortunately he is tolerating the dressing changes without complication. I do not see any signs of infection and overall I think he is making good progress here. We did get approval for the Apligraf but I think he had a $295 co-pay that would have to be paid per application. With that being said as good as he is doing right now I do not think that is necessary but is still an option depending on how things progress if we need to speed this up quite a bit. 08/24/2022; this patient has a wound on his left heel in the setting of type 2 diabetes. We have been using Prisma. He has a heel offloading boot 08-31-2022 upon evaluation today patient appears to be doing poorly in regard to his heel ulcer which is still showing signs of bruising I am just not as happy as I was when I saw him 2 weeks ago with this. He saw Dr. Rufus last week. Also did not look good at that point apparently. Nonetheless I think that the patient is still continuing to have some counterpressure getting to the wound bed unfortunately. 09-07-2022 upon evaluation today patient appears to be doing well currently in regard to his heel ulcer. He has been tolerating the dressing changes without complication. Fortunately there does not appear to be any signs of active infection locally or systemically at this time. Fortunately I do not see any evidence of active infection at this time. 09-15-2022 upon evaluation today patient appears  to be doing well currently in regard to his legs which in fact appear to be  completely healed this is great news. With that being said his heel does have some erythema and warmth as well as drainage I am concerned about cellulitis at this location. 09-21-2022 upon evaluation today patient's wound is actually showing signs of being a little bit smaller but still we are not doing nearly as well as what I would like to see. Fortunately I do not see any signs of infection at this time which is great news and overall I am extremely pleased in that regard. With that being said I do think that he needs to continue to elevate his leg although the edema is under great control with a compression wrap I think switching to a different dressing topically may be beneficial. My suggestion is to switch him over to a Iodoflex dressing. 09-28-2022 upon evaluation today patient appears to be doing well with regard to his heel ulcer. This is actually showing signs of improvement which is great news and overall I am extremely pleased with where we stand today. 10-06-2022 upon evaluation today patient actually appears to be making excellent progress at this point. Fortunately there does not appear to be any signs of active infection locally nor systemically which is great news and overall I am extremely pleased with where we stand. I do feel like he is actually making some pretty good progress here as we switch to the wrap along with the Iodoflex. 12/26; patient has a wound on the tip of his left heel. This is not a weightbearing surface. He is using a heel offloading boot and carefully offloading this at other times during the day. He is using Iodoflex and Zetuvitunder 3 layer compression 10-22-2022 upon evaluation today patient appears to be doing well currently in regard to his wound. He has been tolerating the dressing changes without complication. Fortunately there does not appear to be any signs of active infection locally nor systemically at this time. No fevers, chills, nausea, vomiting, or  diarrhea. 08-30-2023 upon evaluation today patient appears to be doing well currently in regard to his heel ulcer which is showing signs of good improvement. Fortunately I do not see any evidence of infection locally nor systemically which is great news and overall I am extremely pleased with where we stand. No fevers, chills, nausea, vomiting, or diarrhea. Unfortunately he does have a wound on the side of his fifth toe where I think this did rub on the shoe. 11-05-2022 upon evaluation today patient still still showing some signs of pressure at this point and there may have even been some fluid collection at this time. Fortunately I do not see any evidence of active infection locally nor systemically which is great news and overall I am extremely pleased with where we stand. No fevers, chills, nausea, vomiting, or diarrhea. 1/25; the patient's area on the dorsal aspect of the left fifth toe is healed. We have been using Iodoflex to the area on the left heel. The lateral wound is slightly down in length. 11-19-2022 upon evaluation today patient appears to be doing well currently in regard to his wound. He has been tolerating the dressing changes without complication. Fortunately there does not appear to be any signs of infection we been using Iodoflex up to this point. 11-26-2022 upon evaluation today patient appears to be doing poorly currently in regard to his wound. He has been tolerating the dressing changes with the [name], [name] (  [id number]) [id number] Page 5 of 15 Iodoflex without complication. With that being said he does not have any signs of active infection systemically though locally I do feel like there is some evidence of infection here. 12-03-2022 upon evaluation today patient appears to be doing well currently in regard to his wound this is better with the Bactrim  compared to last week still there was some significant slough and biofilm buildup which is going  require sharp debridement at this point. Fortunately I do not see any signs of active infection locally nor systemically which is great news. 12-11-2022 upon evaluation today patient actually appears to be doing significantly better at this time in regard to his heel. I am actually very pleased with where things stand currently. There is no signs of active infection locally or systemically at this point. 12-17-2022 upon evaluation today patient appears to be doing better as far as the overall appearance of his wound. I am actually very pleased in that regard. Fortunately there does not appear to be any signs of active infection locally nor systemically at this time. I do feel like that the patient is showing signs of excellent improvement in general. Nonetheless he needs something better for compression I think he should go ahead and see about getting compression socks. 3/7; tip of the left heel measurements are smaller he has been using Hydrofera Blue 01-07-2023 upon evaluation today patient appears to be doing well currently in regard to his wound. This has not been debrided since I last saw him and it definitely shows that definitely needs some debridement today. Fortunately I do not see any evidence of active infection locally nor systemically which is great news. 01-14-2023 upon evaluation today patient appears to be doing well currently although he does have some erythema and warmth to the heel I feel like he may have developed an infection yet again. This is something that is been ongoing issue we have been trying to manage his foot best we could. With that being said I do think he may need to go back on the Bactrim  this has done well for him in the past. 01-21-2023 upon evaluation today patient appears to be doing better in regard to his heel. He is doing very well with the antibiotics and that is made a big difference for him based on what I am seeing I am very pleased with where we stand today.  He is obviously doing extremely well which is great news. 01-28-2023 upon evaluation today patient appears to be doing well currently in regard to his wound. This is actually showing signs of excellent improvement I am actually very pleased with where we stand and I do believe that he is making good progress here. Fortunately I do not see any evidence of active infection locally nor systemically which is great news. No fevers, chills, nausea, vomiting, or diarrhea. 02-04-2023 upon evaluation today patient's wound is actually showed signs of excellent improvement I am actually very pleased with where we stand. I do not see any signs of active infection locally nor systemically which is great news and in general I do believe that we are moving in the right direction here. No fevers, chills, nausea, vomiting, or diarrhea. 02-11-2023 upon evaluation patient's wound actually showing signs of excellent improvement I am actually very pleased with where we stand I think that his pain is better there is no signs of infection we will continue to use the gentamicin  and overall we are making excellent progress. 02-18-2023 upon  evaluation today patient appears to be doing well currently in regard to his wound. He has been tolerating the dressing changes without complication. Fortunately I do not see any signs of active infection locally nor systemically which is great news. 02-25-2023 upon evaluation today patient's heel actually showed signs of excellent improvement. I am actually very pleased with where we stand today and I feel like that he is making really good progress. Fortunately I do not see any signs of active infection which is good news. No fevers, chills, nausea, vomiting, or diarrhea. 03-04-2023 upon evaluation today patient appears to be doing pretty well currently in regard to his heel. He has been tolerating the dressing changes without complication. Fortunately there does not appear to be any signs of  active infection locally nor systemically which is great news. No fevers, chills, nausea, vomiting, or diarrhea. 03-11-2023 upon evaluation today patient appears to be doing poorly in regard to his heel ulcer. He tells me he has been having to sleep on his back to having trouble breathing therefore I think he has been having some issues here as far as the heel is concerned it looks like he has a deep tissue injury yet again at this point. Fortunately I do not see any signs of infection which is good news but at the same time I do feel like that he is doing worse than where he was previous. I do not see any signs of active infection systemically which is great news. 03-18-2023 upon evaluation today patient appears to be doing well currently in fact the heel is significantly better compared to last week. I am extremely pleased with where we are seeing I am hopeful that he will continue to show signs of improvement going forward. 03-25-2023 upon evaluation today patient appears to be doing well currently in regard to his wounds. He is actually showing signs of improvement on the heel and I am very pleased in that regard. Fortunately I do not see any signs of active infection at this time which is great news. No fevers, chills, nausea, vomiting, or diarrhea. 04-05-2023 upon evaluation today patient's wound is actually showing signs of good improvement. Actually very pleased with where things stand today. Fortunately there does not appear to be any signs of active infection locally or systemically which is great news. 04-19-2023 upon evaluation today patient's wound actually looks the best it has in quite some time. He did have a wrap on for 2 weeks which I think is caused a little bit of irritation going to the forefoot everything else looks to be doing well the wrap did slide down a little bit as well. Fortunately I do not see any signs of infection however I do think that in the future if he has any issues  with not being able to get scheduled to see me he should definitely make an appointment at least for a nurse visit he is aware and in agreement with plan going forward. 04-26-2023 upon evaluation today patient's wound is actually significantly smaller this is great news he is making good progress in the past really 5 or 6 visits we have been seeing improvement week by week I am very pleased in that regard. 05-03-2023 upon evaluation today patient appears to be doing excellent in regard to the wound on his heel. This is actually showing signs of improvement he continues to make good progress towards complete closure which is great news. 7/23; wound continues to do nicely. Smaller with healthy epithelialization.  This is actually a reopening we had already healed this area once and we discussed this this visit. 05-18-2023 upon evaluation today patient's wound actually showing signs of improvement. Fortunately I do not see any signs of active infection locally or systemically which is great news. No fevers, chills, nausea, vomiting, or diarrhea. 05-24-2023 upon evaluation today patient's wound on the heel appears to be doing quite well. Fortunately there does not appear to be any signs of active infection at this time which is great news. Fortunately I think that he is making excellent headway with the current treatment regimen. 06-01-2023 upon evaluation today patient appears to be doing excellent in regard to his wound on the heel this is actually showing signs of being smaller measuring better and looking like is doing quite well. Fortunately I do not see any signs of infection at this time. He does have a small wound on the leg which we can also need to address this looks like it was just a small blister that ruptured I think it should heal pretty quickly. 06-07-2023 upon evaluation today patient appears to be doing well currently in regard to his wound. He has been tolerating the dressing changes  without complication. Fortunately there does not appear to be any signs of active infection locally or systemically at this time. No fevers, chills, nausea, vomiting, or diarrhea. [name], [name] ([id number]) [id number] Page 6 of 15 8/26; wound near the tip of the left heel. He is a diabetic. We have been using Hydrofera Blue under Urgo K2 lite wrap he has been in his surgical shoe although he tells me it disintegrated and he came in on a regular running shoe today. 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 He is not wearing compression stocking 06-22-2023 upon evaluation today patient appears to be doing excellent in regard to his wound on the heel which is actually significantly smaller compared to last week's evaluation. Fortunately I do not see any signs of active infection locally or systemically at this time. 06-28-2023 upon evaluation today patient appears to be doing well currently in regard to his heel which is actually showing signs of improvement. Fortunately I do not see any evidence of active infection at this time which is great news and in general I do believe that we are making headway towards complete closure which is good news. No fevers, chills, nausea, vomiting, or diarrhea. 07-05-2023 upon evaluation today patient appears to be doing well currently in regard to his heel ulcer. This is actually very close I think that being completely healed. This is great news and overall very pleased with where we stand today. 07-12-2023 upon evaluation today patient appears to be doing well currently in regard to his wound. He in fact appears to be very close to complete resolution I am very pleased that regard. However on the tip of his foot there appears to be some issue here with inflammation secondary to infection. I am very concerned about the infection at the site. 07-19-2023 upon evaluation today patient appears to actually be doing well  in regard to his heel. Fortunately I do not see any signs of worsening overall. In general I think you are making headway towards closure. 07-26-2023 upon evaluation today patient appears to be doing well currently in regard to his wound. He has in fact completely healed and looks to be doing great. I still want to keep an eye on things due to the end of  his foot being red although I think this may be secondary to the Tubigrip more than anything to be honest. Fortunately I do not see any signs of active infection at this time which is great news. No fevers, chills, nausea, vomiting, or diarrhea. 08-02-2023 upon evaluation today patient appears to be doing great in regard to his heel ulcer. He has been tolerating the dressing changes without complication. Fortunately I do not see any signs of active infection locally or systemically at this time. 08-31-2023 upon evaluation today patient appears to be doing well currently in regard to his heel this is still closed and looks great unfortunately has a wound on his finger he tells me this is a blister that showed up he popped it with a needle and it is continued to give him trouble since that time. Fortunately I do not see any signs of active infection at this time which is great news. No fevers, chills, nausea, vomiting, or diarrhea. 09-09-2023 upon evaluation today patient appears to be doing well currently in regard to his heel he still needs to make sure to be put in the urea  cream on this to soften up the callus but other than that seems to be doing quite well. Upon inspection patient's finger actually showing signs of improvement as well which and very pleased in regard to with that being said I do think that he needs to monitor for any signs of infection or worsening obviously in general. 09-20-2023 upon evaluation today patient appears to be doing well currently in regard to his wound. He has been tolerating the dressing changes without complication.  Fortunately I do not see any signs of active infection locally or systemically at this time which is good news. 09-27-2023 upon evaluation today patient appears to be doing well currently in regard to his finger ulcer which is actually measuring significantly smaller. Fortunately I do not see any signs of active infection at this time which is great news. No fevers, chills, nausea, vomiting, or diarrhea. 10-04-2023 upon evaluation today patient's finger actually appears to be doing excellent today. Fortunately I do not see any signs of active infection at this time which is great news and in general I do believe there were making good headway here with regard to healing. 10-26-23 upon evaluation today patient appears to be doing poorly unfortunately in regard to the posterior calcaneal region. He tells me that this reopened in the past several weeks. He did not know of any particular injury that he had but he thinks it may have been his shoes rubbing. He has gotten new shoes but again unfortunately the damage was already done literally. With that being said he tells me that he feels like the shoes are doing better although he is not 100% sure obviously because the wound is there he cannot tell whether or not it is preventing it from reoccurring. He does not feel like its gotten any worse since she has been using the new shoes which is good news. Electronic Signature(s) Signed: 10/26/2023 5:30:31 PM By: Bethena Ferraris PA-C Entered By: Bethena Ferraris on 10/26/2023 14:30:31 -------------------------------------------------------------------------------- Physical Exam Details Patient Name: Date of Service: [name], [name] [name]. 10/26/2023 1:00 PM Medical Record Number: [id number] Patient Account Number: [id number] Date of Birth/Sex: Treating RN: 1945-12-04 (78 y.o. [name] Primary Care Provider: Auston Purchase Other Clinician: Purcell Sniff Referring Provider: Treating Provider/Extender: Bethena Ferraris Auston Purchase Weeks in Treatment: 65 Constitutional Well-nourished and well-hydrated in no acute distress. Respiratory  normal breathing without difficulty. [name], [name] ([id number]) [id number] Page 7 of 15 Psychiatric this patient is able to make decisions and demonstrates good insight into disease process. Alert and Oriented x 3. pleasant and cooperative. Notes Upon inspection patient's wound bed actually showed signs of good granulation epithelization at this point. Fortunately there does not appear to be any signs of active infection which is great news and in general I do believe the patient is making good headway here towards closure which is also excellent news. With that being said I did perform debridement today to clear away slough and biofilm down to and including good subcutaneous tissue to where this appears to be much more healthy and I think this is going to aid him in getting this wound to heal. Electronic Signature(s) Signed: 10/26/2023 5:31:06 PM By: Bethena Ferraris PA-C Entered By: Bethena Ferraris on 10/26/2023 14:31:06 -------------------------------------------------------------------------------- Physician Orders Details Patient Name: Date of Service: [name], [name] [name]. 10/26/2023 1:00 PM Medical Record Number: [id number] Patient Account Number: [id number] Date of Birth/Sex: Treating RN: 08/10/1946 (78 y.o. [name] Primary Care Provider: Auston Purchase Other Clinician: Purcell Sniff Referring Provider: Treating Provider/Extender: Bethena Ferraris Auston Purchase Devra in Treatment: 94 The following information was scribed by: Purcell Sniff The information was scribed for: Bethena Ferraris Verbal / Phone Orders: No Diagnosis Coding ICD-10 Coding Code Description E11.622 Type 2 diabetes mellitus with other skin ulcer S60.410A Abrasion of right index finger, initial encounter E11.42 Type 2 diabetes mellitus with diabetic  polyneuropathy Follow-up Appointments Return Appointment in 1 week. Bathing/ [employer] wounds with antibacterial soap and water. May shower; gently cleanse wound with antibacterial soap, rinse and pat dry prior to dressing wounds Anesthetic (Use 'Patient Medications' Section for Anesthetic Order Entry) Lidocaine  applied to wound bed - as needed Off-Loading Open toe surgical shoe - offloading heel surgical shoe left foot Wound Treatment Wound #17 - Calcaneus Wound Laterality: Left Cleanser: Normal Saline Discharge Instructions: Wash your hands with soap and water. Remove old dressing, discard into plastic bag and place into trash. Cleanse the wound with Normal Saline prior to applying a clean dressing using gauze sponges, not tissues or cotton balls. Do not scrub or use excessive force. Pat dry using gauze sponges, not tissue or cotton balls. Cleanser: Soap and Water Discharge Instructions: Gently cleanse wound with antibacterial soap, rinse and pat dry prior to dressing wounds Prim Dressing: Hydrofera Blue Ready Transfer Foam, 2.5x2.5 (in/in) ary Discharge Instructions: Apply Hydrofera Blue Ready to wound bed as directed Secondary Dressing: ABD Pad 5x9 (in/in) Discharge Instructions: Cover with ABD pad TEKOA, AMON ([id number]) [id number] Page 8 of 15 Compression Wrap: Urgo K2 Lite, two layer compression system, regular Electronic Signature(s) Signed: 10/27/2023 5:49:46 PM By: Purcell Sniff Signed: 10/27/2023 6:16:28 PM By: Bethena Ferraris PA-C Entered By: Purcell Sniff on 10/26/2023 10:46:30 -------------------------------------------------------------------------------- Problem List Details Patient Name: Date of Service: [name], [name] [name]. 10/26/2023 1:00 PM Medical Record Number: [id number] Patient Account Number: [id number] Date of Birth/Sex: Treating RN: 24-Aug-1946 (78 y.o. [name] Primary Care Provider: Auston Purchase Other  Clinician: Purcell Sniff Referring Provider: Treating Provider/Extender: Bethena Ferraris Auston Purchase Weeks in Treatment: 66 Active Problems ICD-10 Encounter Code Description Active Date MDM Diagnosis E11.621 Type 2 diabetes mellitus with foot ulcer 06/19/2022 No Yes L97.422 Non-pressure chronic ulcer of left heel and midfoot with fat layer exposed 10/26/2023 No Yes E11.42 Type 2 diabetes mellitus with diabetic polyneuropathy 06/19/2022 No Yes Inactive Problems Resolved Problems ICD-10 Code Description  Active Date Resolved Date L97.522 Non-pressure chronic ulcer of other part of left foot with fat layer exposed 06/19/2022 06/19/2022 Electronic Signature(s) Signed: 10/26/2023 5:32:34 PM By: Bethena Ferraris PA-C Previous Signature: 10/26/2023 1:07:40 PM Version By: Bethena Ferraris PA-C Entered By: Bethena Ferraris on 10/26/2023 14:32:34 [name] ([id number]) [id number] Page 9 of 15 -------------------------------------------------------------------------------- Progress Note Details Patient Name: Date of Service: [name], [name] [name]. 10/26/2023 1:00 PM Medical Record Number: [id number] Patient Account Number: [id number] Date of Birth/Sex: Treating RN: 08/16/1946 (77 y.o. [name] Primary Care Provider: Auston Purchase Other Clinician: Purcell Sniff Referring Provider: Treating Provider/Extender: Bethena Ferraris Auston Purchase Weeks in Treatment: 75 Subjective Chief Complaint Information obtained from Patient Left heel ulcer History of Present Illness (HPI) 09/24/2020 on evaluation today patient presents today for a heel ulcer that he tells me has been present for about 2 years. He has been seeing podiatry and they have been attempting to manage this including what sounds to be a total contact cast, Unna boot, and just standard dressings otherwise as well. Most recently has been using triple antibiotic ointment. With that being said unfortunately despite everything  he really has not had any significant improvement. He tells me that he cannot even really remember exactly how this began but he presumed it may have rubbed on his shoes or something of that nature. 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 He also does have chronic pain in the way of neuropathy which he takes medications for including Cymbalta  and methadone . He tells me that this does seem to help. Fortunately there is no signs of active infection at this time. His most recent hemoglobin A1c was 8.1 though he knows this was this year he cannot tell me the exact time. His fluid pills currently to help with some of the lower extremity edema although he does obviously have signs of venous stasis/lymphedema. Currently there is no evidence of active infection. No fevers, chills, nausea, vomiting, or diarrhea. Patient has had fairly recent ABIs which were performed on 07/19/2020 and revealed that he has normal findings in both the ankle and toe locations bilaterally. His ABI on the right was 1.09 on the left was 1.08 with a TBI on the right of 0.88 and on the left of 0.94. Triphasic flow was noted throughout. 10/08/2020 on evaluation today patient appears to be doing pretty well in regard to his left heel currently in fact this is doing a great job and seems to be healing quite nicely. Unfortunately on his right leg he had a pile of wood that actually fell on him injuring his right leg this is somewhat erythematous has me concerned little bit about cellulitis though there is not really a good area to culture at this point. 10/24/2020 upon evaluation today patient appears to be doing well with regard to his heel ulcer. He is showing signs of improvement which is great news. His right leg is completely healed. Overall I feel like he is doing excellent  and there is no signs of infection. 11/07/2020 upon evaluation today patient appears to be doing well with regard to his heel ulcer. He tells me that last week when he was unable to come in his wife actually thought that the wound was very close to closing if not closed. Then it began to reopen again. I  really feel like what may have happened as the collagen may have dried over the wound bed and that because that misunderstanding with thinking that the wound was healing. With that being said I did not see it last week I do not know that for certain. Either way I feel like he is doing great today I see no signs of infection at this point. 11/26/2020 upon inspection today patient appears to be doing decently well in regard to his heel ulcer. He has been tolerating the dressing changes without complication. Fortunately there is no sign of active infection at this time. No fevers, chills, nausea, vomiting, or diarrhea. 12/10/2020 upon evaluation today patient appears to be doing fairly well in regard to the wound on his heel as well as what appears to be a new wound of the left first metatarsal head plantar aspect. This seems to be an area that was callus that has split as the patient tells me has been trying to walk on his toes more has probably where this came from. With that being said there does not appear to be signs of active infection which is great news. 12/17/2020 upon evaluation today patient actually appears to be making good progress currently. Fortunately there is no evidence of active infection at this time. Overall I feel like he is very close to complete closure. 12/26/2020 upon evaluation today patient appears to be doing excellent in regard to his wounds. In fact I am not certain that these are not even completely healed on initial inspection. Overall I am very pleased with where things stand today. Good news is that they are healed he is actually get ready to go out of town and that will be  helpful as well as he will be a full part of the time. Readmission: 02/04/2021 upon evaluation today patient appears to be doing well at this point in regard to his left heel that I previously saw him for. Unfortunately he is having issues with his right lower extremity. He has significant wounds at this point he also has erythema noted there is definite signs of cellulitis which is unfortunate. With that being said I think we do need to address this sooner rather than later. The good news is he did have a nice trip to the beach. He tells me that he had no issues during that time. 4/27; patient on Bactrim . Culture showed methicillin sensitive staph aureus therefore the Bactrim  should be effective. 2 small areas on the right leg are healed the area on the mid aspect of the tibia almost 100% covered in a very adherent necrotic debris. We have been using silver alginate 02/20/2021 upon evaluation today patient appears to be doing well with regard to his wound. He is showing signs of improvement which is great news overall very pleased with where things stand today. No fevers, chills, nausea, vomiting, or diarrhea. 02/27/2021 upon evaluation today patient appears to be doing well with regard to his leg ulcer. He is tolerating the dressing changes and overall appears to be doing quite excellent. I am extremely pleased with where things stand and overall I think patient is making great progress. There is no sign of active infection at this time which is also great news. 03/06/2021 upon evaluation today patient appears to be doing excellent in regard to his wounds. In fact he appears to be completely healed today based on what I am seeing. This is excellent news and overall I am extremely pleased with where he stands.  Overall the patient is happy to hear this as well this has been quite sometime coming. [name], [name] ([id number]) [id number] Page 10 of 15 Readmission: 04/01/2021  patient unfortunately returns for readmission today. He tells me that he has been wearing his compression socks on the right leg daily and he does not really know what is going on and why his legs are doing what they are doing. We did therefore go ahead and probe deeper into exactly what has been going on with him today. Subsequently the patient tells me that when he gets up and what we would call first thing in the morning are really 2 different things. He does not tend to sleep well so he tells me that he will often wake up at 3:00 in the morning. He will then potentially going into the living room to get in his chair where he may read a book for a little while and then potentially fall asleep back in his chair. He then subsequently wake up around 5:00 or so and then get up and in his words putter around. This often will end with him proceeding at some point around 8 AM or 9 AM to putting on his compression socks. With that being said this means that anywhere from the 3:00 in the morning till roughly around 9:00 in the morning he has no compression on yet he is sitting in his recliner, walking around and up and about, and this is at least 5 to 6 hours of noncompressed time. That may be our issue here but I did not realize until we discussed this further today. Fortunately there does not appear to be any signs of active infection at this time which is great news. With that being said he has multiple wounds of the bilateral lower extremities. 04/10/2021 upon evaluation today patient appears to be doing well with regard to his legs for the most part. He does look like he had some injury where his wrap slid down nonetheless he did some doctoring on them. I do feel like most of the areas honestly have cleared back up which is great news. There does not appear to be any signs of active infection at this time which is also great news. No fevers, chills, nausea, vomiting, or diarrhea. 04/18/2021 upon  evaluation today patient appears to be doing well with regard to his wounds in fact he appears to be completely healed which is great news. Fortunately there does not appear to be any signs of active infection which is great news. No fevers, chills, nausea, vomiting, or diarrhea. READMISSION 07/28/2021 This is a now 78 year old man we have had in this clinic for quite a bit of this year. 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 He wears compression stockings. 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 He comes in an old running shoes although he says he wears better shoes at home. His history is that he noticed a blister in the right posterior heel. This open. He has been applying Neosporin to it currently the wound measures 2 x 1.4 cm. 100% slough covered. Not really offloading this in any rigorous way. Past medical history is essentially unchanged he has paroxysmal atrial fibrillation on Coumadin  type 2 diabetes with a recent hemoglobin A1c of 7 on metformin . ABI in our clinic was 0.98 on the right 08/05/2021 upon evaluation today patient appears to be doing well with regard  to his heel ulcer. Fortunately there does not appear to be any signs of active infection at this time. Overall I been very pleased with where things seem to be currently as far as the wound healing is concerned. Again this is first a lot of seeing him Dr. Rufus readmitted him last week. Nonetheless I think we are definitely making some progress here. 08/11/2021 upon evaluation today patient's wound on the heel actually showing signs of excellent improvement. I am actually very pleased with where things stand currently. No fevers, chills, nausea, vomiting, or diarrhea. There does not appear to be any need for sharp debridement today either which is also great news. 08/25/2021 upon evaluation today patient appears to  be doing well with regard to his heel ulcer. This is actually looking significantly improved compared to last time I saw him. Fortunately there does not appear to be any evidence of active infection at this time. No fevers, chills, nausea, vomiting, or diarrhea. 09/01/2021 upon evaluation today patient appears to be doing decently well in regard to his wounds. Fortunately there does not appear to be any signs of active infection at this time which is great news and overall very pleased in that regard. I do not see any evidence of active infection systemically which is great news as well. No fevers, chills, nausea, vomiting, or diarrhea. I think that the heel is making excellent progress. 09/15/2021 upon evaluation today patient's heel unfortunately is significantly worse compared to what it was previous. I do believe that at this time he would benefit from switching back to something a little bit more offloading from the cushion shoe he has right now this is more of a heel protector what I really need is a Prevalon offloading boot which I think is good to do a lot better for him than just the small heel protector. 09/23/2021 upon evaluation today patient appears to be doing a little better in regards to last week's visit. Overall I think that he is making good progress which is great news and there does not appear to be any evidence of active infection at this time. No fevers, chills, nausea, vomiting, or diarrhea. 09/30/2021 upon evaluation patient's heel is actually showing signs of good improvement which is great news. Fortunately there does not appear to be any evidence of active infection locally nor systemically at this point. No fevers, chills, nausea, vomiting, or diarrhea. 10/07/2021 upon evaluation today patient appears to be doing well currently in regard to his heel ulcer. He has been tolerating the dressing changes without complication. Fortunately I do not see any signs of active infection  at this time which is great news. No fevers, chills, nausea, vomiting, or diarrhea. 12/27; wound is measuring smaller. We have been using silver alginate 10/28/2021 upon evaluation today patient appears to be doing well currently in regard to his heel ulcer. I am actually very pleased with where things stand and I think he is making excellent progress. Fortunately I do not see any signs of active infection locally nor systemically at this point which is great news. Nonetheless I do believe that the patient would benefit from a new offloading shoe and has been using it Velcro is no longer functioning properly and he tells me that he almost tripped and fell because of that today. Obviously I do not want him following that would be very bad. 11/11/2021 upon evaluation today patient appears to be doing excellent in regard to his wound. In fact this appears to be  completely healed based on what I see currently. I do not see any signs of anything open or draining and I did double check just by clearing away some of the callus around the edges of the wound to ensure that there was nothing still open or hiding underneath. Once this was cleared away it appeared that the patient was doing significantly better at this time which is great news and there was nothing actually open. Readmission: 06-19-2022 upon evaluation today patient appears to be doing well currently in regard to his heel ulcer all things considered. He unfortunately is having a bit of breakdown in general as far as the heel is concerned not nearly as bad as what we noted last time he was here in the clinic. The good news is I do think that this should hopefully heal much more rapidly than what we noted last time. His past medical history has not changed he is on Coumadin . 07-03-2022 upon evaluation today patient appears to be doing better in regard to his heel. We will start to see some improvement here which is good news. Fortunately I do not see  any evidence of infection locally or systemically at this time which is great news. No fevers, chills, nausea, vomiting, or diarrhea. 07-10-2022 upon evaluation today patient appears to be doing well currently in regard to his wound from the callus standpoint around the edges. Unfortunately from an actual wound bed standpoint he has some deep tissue injury noted at this point. Again I am not sure exactly what is going on that is causing this pressure to the region but he is definitely gotten pressure that is occurring and causing this to not heal as effectively as what we would like to see. I discussed with him today that he may need to at night when he sleeping use a pillow up underneath his calf in order to prevent the heel from touching the bed at all. I also think that it may be beneficial for him to monitor throughout his day and make sure there is no other time when he is getting the pressure to the heel. 07-23-2022 upon evaluation today patient appears to be doing poorly currently in regard to his heel ulcer. He has been tolerating the dressing changes without complication. Unfortunately I feel like he may be infected based on what I am seeing. [name], [name] ([id number]) [id number] Page 11 of 15 07-28-2022 I did review patient's culture results which showed positive for Proteus as well as Staphylococcus both of which would be treated with the Bactrim  DS that I gave him at the last visit. This is good news and hopefully means that we should be able to apply the cast today as long as the wound itself does not appear to be doing poorly. Patient's wound bed actually showed signs of doing quite well and I am very pleased with where things stand and I do not see any signs of worsening overall which is great news. 08-04-2022 upon evaluation today patient appears to be doing well currently in regard to his heel ulcer. I am actually very pleased with where things stand and  I do think this is looking significantly better. With regard to his right shin that is also showing signs of improvement which is great news. 08-10-2022 upon evaluation today patient's wound on the heel actually appears to be doing significantly better. In regard to the right anterior shin this is showing signs of healing it might even be completely healed but I  still get a monitor I cannot get anything to fill away but also could not see where there was anything actually draining at this point. I am tending to think healed but I want a monitor 1 more week before I call it for sure. 08-17-2022 upon evaluation today patient appears to be doing well currently in regard to his heel. Fortunately he is tolerating the dressing changes without complication. I do not see any signs of infection and overall I think he is making good progress here. We did get approval for the Apligraf but I think he had a $295 co-pay that would have to be paid per application. With that being said as good as he is doing right now I do not think that is necessary but is still an option depending on how things progress if we need to speed this up quite a bit. 08/24/2022; this patient has a wound on his left heel in the setting of type 2 diabetes. We have been using Prisma. He has a heel offloading boot 08-31-2022 upon evaluation today patient appears to be doing poorly in regard to his heel ulcer which is still showing signs of bruising I am just not as happy as I was when I saw him 2 weeks ago with this. He saw Dr. Rufus last week. Also did not look good at that point apparently. Nonetheless I think that the patient is still continuing to have some counterpressure getting to the wound bed unfortunately. 09-07-2022 upon evaluation today patient appears to be doing well currently in regard to his heel ulcer. He has been tolerating the dressing changes without complication. Fortunately there does not appear to be any signs of active  infection locally or systemically at this time. Fortunately I do not see any evidence of active infection at this time. 09-15-2022 upon evaluation today patient appears to be doing well currently in regard to his legs which in fact appear to be completely healed this is great news. With that being said his heel does have some erythema and warmth as well as drainage I am concerned about cellulitis at this location. 09-21-2022 upon evaluation today patient's wound is actually showing signs of being a little bit smaller but still we are not doing nearly as well as what I would like to see. Fortunately I do not see any signs of infection at this time which is great news and overall I am extremely pleased in that regard. With that being said I do think that he needs to continue to elevate his leg although the edema is under great control with a compression wrap I think switching to a different dressing topically may be beneficial. My suggestion is to switch him over to a Iodoflex dressing. 09-28-2022 upon evaluation today patient appears to be doing well with regard to his heel ulcer. This is actually showing signs of improvement which is great news and overall I am extremely pleased with where we stand today. 10-06-2022 upon evaluation today patient actually appears to be making excellent progress at this point. Fortunately there does not appear to be any signs of active infection locally nor systemically which is great news and overall I am extremely pleased with where we stand. I do feel like he is actually making some pretty good progress here as we switch to the wrap along with the Iodoflex. 12/26; patient has a wound on the tip of his left heel. This is not a weightbearing surface. He is using a heel offloading boot and  carefully offloading this at other times during the day. He is using Iodoflex and Zetuvitunder 3 layer compression 10-22-2022 upon evaluation today patient appears to be doing well  currently in regard to his wound. He has been tolerating the dressing changes without complication. Fortunately there does not appear to be any signs of active infection locally nor systemically at this time. No fevers, chills, nausea, vomiting, or diarrhea. 08-30-2023 upon evaluation today patient appears to be doing well currently in regard to his heel ulcer which is showing signs of good improvement. Fortunately I do not see any evidence of infection locally nor systemically which is great news and overall I am extremely pleased with where we stand. No fevers, chills, nausea, vomiting, or diarrhea. Unfortunately he does have a wound on the side of his fifth toe where I think this did rub on the shoe. 11-05-2022 upon evaluation today patient still still showing some signs of pressure at this point and there may have even been some fluid collection at this time. Fortunately I do not see any evidence of active infection locally nor systemically which is great news and overall I am extremely pleased with where we stand. No fevers, chills, nausea, vomiting, or diarrhea. 1/25; the patient's area on the dorsal aspect of the left fifth toe is healed. We have been using Iodoflex to the area on the left heel. The lateral wound is slightly down in length. 11-19-2022 upon evaluation today patient appears to be doing well currently in regard to his wound. He has been tolerating the dressing changes without complication. Fortunately there does not appear to be any signs of infection we been using Iodoflex up to this point. 11-26-2022 upon evaluation today patient appears to be doing poorly currently in regard to his wound. He has been tolerating the dressing changes with the Iodoflex without complication. With that being said he does not have any signs of active infection systemically though locally I do feel like there is some evidence of infection here. 12-03-2022 upon evaluation today patient appears to be doing  well currently in regard to his wound this is better with the Bactrim  compared to last week still there was some significant slough and biofilm buildup which is going require sharp debridement at this point. Fortunately I do not see any signs of active infection locally nor systemically which is great news. 12-11-2022 upon evaluation today patient actually appears to be doing significantly better at this time in regard to his heel. I am actually very pleased with where things stand currently. There is no signs of active infection locally or systemically at this point. 12-17-2022 upon evaluation today patient appears to be doing better as far as the overall appearance of his wound. I am actually very pleased in that regard. Fortunately there does not appear to be any signs of active infection locally nor systemically at this time. I do feel like that the patient is showing signs of excellent improvement in general. Nonetheless he needs something better for compression I think he should go ahead and see about getting compression socks. 3/7; tip of the left heel measurements are smaller he has been using Hydrofera Blue 01-07-2023 upon evaluation today patient appears to be doing well currently in regard to his wound. This has not been debrided since I last saw him and it definitely shows that definitely needs some debridement today. Fortunately I do not see any evidence of active infection locally nor systemically which is great news. 01-14-2023 upon evaluation today patient  appears to be doing well currently although he does have some erythema and warmth to the heel I feel like he may have developed an infection yet again. This is something that is been ongoing issue we have been trying to manage his foot best we could. With that being said I do think he may need to go back on the Bactrim  this has done well for him in the past. 01-21-2023 upon evaluation today patient appears to be doing better in regard to  his heel. He is doing very well with the antibiotics and that is made a big difference for him based on what I am seeing I am very pleased with where we stand today. He is obviously doing extremely well which is great news. [name], [name] ([id number]) [id number] Page 12 of 15 01-28-2023 upon evaluation today patient appears to be doing well currently in regard to his wound. This is actually showing signs of excellent improvement I am actually very pleased with where we stand and I do believe that he is making good progress here. Fortunately I do not see any evidence of active infection locally nor systemically which is great news. No fevers, chills, nausea, vomiting, or diarrhea. 02-04-2023 upon evaluation today patient's wound is actually showed signs of excellent improvement I am actually very pleased with where we stand. I do not see any signs of active infection locally nor systemically which is great news and in general I do believe that we are moving in the right direction here. No fevers, chills, nausea, vomiting, or diarrhea. 02-11-2023 upon evaluation patient's wound actually showing signs of excellent improvement I am actually very pleased with where we stand I think that his pain is better there is no signs of infection we will continue to use the gentamicin  and overall we are making excellent progress. 02-18-2023 upon evaluation today patient appears to be doing well currently in regard to his wound. He has been tolerating the dressing changes without complication. Fortunately I do not see any signs of active infection locally nor systemically which is great news. 02-25-2023 upon evaluation today patient's heel actually showed signs of excellent improvement. I am actually very pleased with where we stand today and I feel like that he is making really good progress. Fortunately I do not see any signs of active infection which is good news. No fevers, chills, nausea,  vomiting, or diarrhea. 03-04-2023 upon evaluation today patient appears to be doing pretty well currently in regard to his heel. He has been tolerating the dressing changes without complication. Fortunately there does not appear to be any signs of active infection locally nor systemically which is great news. No fevers, chills, nausea, vomiting, or diarrhea. 03-11-2023 upon evaluation today patient appears to be doing poorly in regard to his heel ulcer. He tells me he has been having to sleep on his back to having trouble breathing therefore I think he has been having some issues here as far as the heel is concerned it looks like he has a deep tissue injury yet again at this point. Fortunately I do not see any signs of infection which is good news but at the same time I do feel like that he is doing worse than where he was previous. I do not see any signs of active infection systemically which is great news. 03-18-2023 upon evaluation today patient appears to be doing well currently in fact the heel is significantly better compared to last week. I am extremely pleased with where  we are seeing I am hopeful that he will continue to show signs of improvement going forward. 03-25-2023 upon evaluation today patient appears to be doing well currently in regard to his wounds. He is actually showing signs of improvement on the heel and I am very pleased in that regard. Fortunately I do not see any signs of active infection at this time which is great news. No fevers, chills, nausea, vomiting, or diarrhea. 04-05-2023 upon evaluation today patient's wound is actually showing signs of good improvement. Actually very pleased with where things stand today. Fortunately there does not appear to be any signs of active infection locally or systemically which is great news. 04-19-2023 upon evaluation today patient's wound actually looks the best it has in quite some time. He did have a wrap on for 2 weeks which I think is  caused a little bit of irritation going to the forefoot everything else looks to be doing well the wrap did slide down a little bit as well. Fortunately I do not see any signs of infection however I do think that in the future if he has any issues with not being able to get scheduled to see me he should definitely make an appointment at least for a nurse visit he is aware and in agreement with plan going forward. 04-26-2023 upon evaluation today patient's wound is actually significantly smaller this is great news he is making good progress in the past really 5 or 6 visits we have been seeing improvement week by week I am very pleased in that regard. 05-03-2023 upon evaluation today patient appears to be doing excellent in regard to the wound on his heel. This is actually showing signs of improvement he continues to make good progress towards complete closure which is great news. 7/23; wound continues to do nicely. Smaller with healthy epithelialization. This is actually a reopening we had already healed this area once and we discussed this this visit. 05-18-2023 upon evaluation today patient's wound actually showing signs of improvement. Fortunately I do not see any signs of active infection locally or systemically which is great news. No fevers, chills, nausea, vomiting, or diarrhea. 05-24-2023 upon evaluation today patient's wound on the heel appears to be doing quite well. Fortunately there does not appear to be any signs of active infection at this time which is great news. Fortunately I think that he is making excellent headway with the current treatment regimen. 06-01-2023 upon evaluation today patient appears to be doing excellent in regard to his wound on the heel this is actually showing signs of being smaller measuring better and looking like is doing quite well. Fortunately I do not see any signs of infection at this time. He does have a small wound on the leg which we can also need to address  this looks like it was just a small blister that ruptured I think it should heal pretty quickly. 06-07-2023 upon evaluation today patient appears to be doing well currently in regard to his wound. He has been tolerating the dressing changes without complication. Fortunately there does not appear to be any signs of active infection locally or systemically at this time. No fevers, chills, nausea, vomiting, or diarrhea. 8/26; wound near the tip of the left heel. He is a diabetic. We have been using Hydrofera Blue under Urgo K2 lite wrap he has been in his surgical shoe although he tells me it disintegrated and he came in on a regular running shoe today. 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 He is not wearing compression stocking 06-22-2023 upon evaluation today patient appears to be doing excellent in regard to his wound on the heel which is actually significantly smaller compared to last week's evaluation. Fortunately I do not see any signs of active infection locally or systemically at this time. 06-28-2023 upon evaluation today patient appears to be doing well currently in regard to his heel which is actually showing signs of improvement. Fortunately I do not see any evidence of active infection at this time which is great news and in general I do believe that we are making headway towards complete closure which is good news. No fevers, chills, nausea, vomiting, or diarrhea. 07-05-2023 upon evaluation today patient appears to be doing well currently in regard to his heel ulcer. This is actually very close I think that being completely healed. This is great news and overall very pleased with where we stand today. 07-12-2023 upon evaluation today patient appears to be doing well currently in regard to his wound. He in fact appears to be very close to complete resolution I am very pleased that regard. However on the tip of his foot there appears to be some issue here with  inflammation secondary to infection. I am very concerned about the infection at the site. 07-19-2023 upon evaluation today patient appears to actually be doing well in regard to his heel. Fortunately I do not see any signs of worsening overall. In general I think you are making headway towards closure. 07-26-2023 upon evaluation today patient appears to be doing well currently in regard to his wound. He has in fact completely healed and looks to be doing great. I still want to keep an eye on things due to the end of his foot being red although I think this may be secondary to the Tubigrip more than anything to be honest. Fortunately I do not see any signs of active infection at this time which is great news. No fevers, chills, nausea, vomiting, or diarrhea. 08-02-2023 upon evaluation today patient appears to be doing great in regard to his heel ulcer. He has been tolerating the dressing changes without [name], [name] ([id number]) [id number] Page 13 of 15 complication. Fortunately I do not see any signs of active infection locally or systemically at this time. 08-31-2023 upon evaluation today patient appears to be doing well currently in regard to his heel this is still closed and looks great unfortunately has a wound on his finger he tells me this is a blister that showed up he popped it with a needle and it is continued to give him trouble since that time. Fortunately I do not see any signs of active infection at this time which is great news. No fevers, chills, nausea, vomiting, or diarrhea. 09-09-2023 upon evaluation today patient appears to be doing well currently in regard to his heel he still needs to make sure to be put in the urea  cream on this to soften up the callus but other than that seems to be doing quite well. Upon inspection patient's finger actually showing signs of improvement as well which and very pleased in regard to with that being said I do think  that he needs to monitor for any signs of infection or worsening obviously in general. 09-20-2023 upon evaluation today patient appears to be doing well currently in regard to his wound. He has been tolerating the dressing changes without complication. Fortunately I do not see any  signs of active infection locally or systemically at this time which is good news. 09-27-2023 upon evaluation today patient appears to be doing well currently in regard to his finger ulcer which is actually measuring significantly smaller. Fortunately I do not see any signs of active infection at this time which is great news. No fevers, chills, nausea, vomiting, or diarrhea. 10-04-2023 upon evaluation today patient's finger actually appears to be doing excellent today. Fortunately I do not see any signs of active infection at this time which is great news and in general I do believe there were making good headway here with regard to healing. 10-26-23 upon evaluation today patient appears to be doing poorly unfortunately in regard to the posterior calcaneal region. He tells me that this reopened in the past several weeks. He did not know of any particular injury that he had but he thinks it may have been his shoes rubbing. He has gotten new shoes but again unfortunately the damage was already done literally. With that being said he tells me that he feels like the shoes are doing better although he is not 100% sure obviously because the wound is there he cannot tell whether or not it is preventing it from reoccurring. He does not feel like its gotten any worse since she has been using the new shoes which is good news. Objective Constitutional Well-nourished and well-hydrated in no acute distress. Vitals Time Taken: 1:08 PM, Height: 70 in, Weight: 265 lbs, BMI: 38, Temperature: 98.5 F, Pulse: 65 bpm, Respiratory Rate: 18 breaths/min, Blood Pressure: 135/61 mmHg. Respiratory normal breathing without  difficulty. Psychiatric this patient is able to make decisions and demonstrates good insight into disease process. Alert and Oriented x 3. pleasant and cooperative. General Notes: Upon inspection patient's wound bed actually showed signs of good granulation epithelization at this point. Fortunately there does not appear to be any signs of active infection which is great news and in general I do believe the patient is making good headway here towards closure which is also excellent news. With that being said I did perform debridement today to clear away slough and biofilm down to and including good subcutaneous tissue to where this appears to be much more healthy and I think this is going to aid him in getting this wound to heal. Integumentary (Hair, Skin) Wound #17 status is Open. Original cause of wound was Pressure Injury. The date acquired was: 10/11/2023. The wound is located on the Left Calcaneus. The wound measures 0.6cm length x 1.5cm width x 0.1cm depth; 0.707cm^2 area and 0.071cm^3 volume. There is Fat Layer (Subcutaneous Tissue) exposed. There is a small amount of serosanguineous drainage noted. 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 There was a Excisional Skin/Subcutaneous Tissue Debridement with a total area of 0.71 sq cm performed by Bethena Ferraris, PA-C. With the following instrument(s): Curette to remove Viable and Non-Viable tissue/material. Material removed includes Callus, Subcutaneous Tissue, and Slough. A time out was conducted at 13:40, prior to the start of the procedure. A Moderate amount of bleeding was controlled with Silver Nitrate. The procedure was tolerated well. Post Debridement Measurements: 0.6cm  length x 1.5cm width x 0.1cm depth; 0.071cm^3 volume. Post debridement Stage noted as Category/Stage I. Character of Wound/Ulcer Post Debridement is stable.  Post procedure Diagnosis Wound #17: Same as Pre-Procedure General Notes: one silver nitrate stick used to stop bleeding. DEITRICK, FERRERI ([id number]) [id number] Page 14 of 15 Plan Follow-up Appointments: Return Appointment in 1 week. Bathing/ Shower/ Hygiene: Wash wounds with antibacterial soap and water. May shower; gently cleanse wound with antibacterial soap, rinse and pat dry prior to dressing wounds Anesthetic (Use 'Patient Medications' Section for Anesthetic Order Entry): Lidocaine  applied to wound bed - as needed Off-Loading: Open toe surgical shoe - offloading heel surgical shoe left foot WOUND #17: - Calcaneus Wound Laterality: Left Cleanser: Normal Saline Discharge Instructions: Wash your hands with soap and water. Remove old dressing, discard into plastic bag and place into trash. Cleanse the wound with Normal Saline prior to applying a clean dressing using gauze sponges, not tissues or cotton balls. Do not scrub or use excessive force. Pat dry using gauze sponges, not tissue or cotton balls. Cleanser: Soap and Water Discharge Instructions: Gently cleanse wound with antibacterial soap, rinse and pat dry prior to dressing wounds Prim Dressing: Hydrofera Blue Ready Transfer Foam, 2.5x2.5 (in/in) ary Discharge Instructions: Apply Hydrofera Blue Ready to wound bed as directed Secondary Dressing: ABD Pad 5x9 (in/in) Discharge Instructions: Cover with ABD pad Com pression Wrap: Urgo K2 Lite, two layer compression system, regular 1. Will get a go ahead and initiate treatment with a Hydrofera Blue dressing which I think is gena be the right thing to do. 2. I am also going to go ahead and initiate treatment with the Urgo K2 light compression wrap. We will see patient back for reevaluation in 1 week  here in the clinic. If anything worsens or changes patient will contact our office for additional recommendations. Electronic Signature(s) Signed: 10/26/2023 5:32:57 PM By: Bethena Ferraris PA-C Previous Signature: 10/26/2023 5:31:41 PM Version By: Bethena Ferraris PA-C Previous Signature: 10/26/2023 5:31:28 PM Version By: Bethena Ferraris PA-C Entered By: Bethena Ferraris on 10/26/2023 14:32:57 -------------------------------------------------------------------------------- SuperBill Details Patient Name: Date of Service: [name], [name] [name]. 10/26/2023 Medical Record Number: [id number] Patient Account Number: [id number] Date of Birth/Sex: Treating RN: [birth year] (77 y.o. [name] Primary Care Provider: Auston Purchase Other Clinician: Purcell Sniff Referring Provider: Treating Provider/Extender: Bethena Ferraris Auston Purchase Weeks in Treatment: 35 Diagnosis Coding ICD-10 Codes Code Description E11.621 Type 2 diabetes mellitus with foot ulcer L97.422 Non-pressure chronic ulcer of left heel and midfoot with fat layer exposed E11.42 Type 2 diabetes mellitus with diabetic polyneuropathy Facility Procedures : CHRISTINO, MCGLINCHEY Code: [id number] DRESHAWN HENDERSHOTT ([id number] Description: 11042 - DEB SUBQ TISSUE 20 SQ CM/< ICD-10 Diagnosis Description L97.422 Non-pressure chronic ulcer of left heel and midfoot with fat layer exposed ) [phone] Modifier: [phone].pdf Page 15 Quantity: 1 of 15 Physician Procedures : CPT4 Code Description Modifier 11042 11042 - WC PHYS SUBQ TISS 20 SQ CM ICD-10 Diagnosis Description L97.422 Non-pressure chronic ulcer of left heel and midfoot with fat layer exposed Quantity: 1 Electronic Signature(s) Signed: 10/26/2023 5:33:14 PM By: Bethena Ferraris PA-C Entered By: Bethena Ferraris on 10/26/2023 14:33:13

## 2023-10-26 NOTE — Progress Notes (Addendum)
 RUSSEL, MORAIN ([id number]) [id number] Page 1 of 8 Visit Report for 10/26/2023 Arrival Information Details Patient Name: Date of Service: [name], [name] [name] 10/26/2023 1:00 PM Medical Record Number: [id number] Patient Account Number: [id number] Date of Birth/Sex: Treating RN: [birth year] (78 y.o. [name] Primary Care Alyjah Lovingood: Auston Purchase Other Clinician: Purcell Sniff Referring Rukiya Hodgkins: Treating Kindell Strada/Extender: Bethena Andre [place]on [place] Devra in Treatment: 73 Visit Information History Since Last Visit All ordered tests and consults were completed: No Patient Arrived: Vannie Added or deleted any medications: No Arrival Time: 12:56 Any new allergies or adverse reactions: No Transfer Assistance: None Had a fall or experienced change in No Patient Identification Verified: Yes activities of daily living that may affect Secondary Verification Process Completed: Yes risk of falls: Patient Requires Transmission-Based Precautions: No Signs or symptoms of abuse/neglect since last visito No Patient Has Alerts: Yes Hospitalized since last visit: No Patient Alerts: Patient on Blood Thinner Implantable device outside of the clinic excluding No Warfarin cellular tissue based products placed in the center Type II Diabetic since last visit: Has Dressing in Place as Prescribed: Yes Pain Present Now: No Electronic Signature(s) Signed: 10/27/2023 5:49:46 PM By: Purcell Sniff Entered By: Purcell Sniff on 10/26/2023 13:07:45 -------------------------------------------------------------------------------- Clinic Level of Care Assessment Details Patient Name: Date of Service: [name], [name] [name] 10/26/2023 1:00 PM Medical Record Number: [id number] Patient Account Number: [id number] Date of Birth/Sex: Treating RN: 02-08-1946 (78 y.o. [name] Primary Care Drevion Offord: Auston Purchase Other Clinician: Purcell Sniff Referring Kimora Stankovic: Treating  Ylonda Storr/Extender: Bethena Andre [place] Devra in Treatment: 35 Clinic Level of Care Assessment Items TOOL 1 Quantity Score []  - 0 Use when EandM and Procedure is performed on INITIAL visit ASSESSMENTS - Nursing Assessment / Reassessment []  - 0 General Physical Exam (combine w/ comprehensive assessment (listed just below) when performed on new pt. evals) []  - 0 Comprehensive Assessment (HX, ROS, Risk Assessments, Wounds Hx, etc.) [name], [name] ([id number]) [phone].pdf Page 2 of 8 ASSESSMENTS - Wound and Skin Assessment / Reassessment []  - 0 Dermatologic / Skin Assessment (not related to wound area) ASSESSMENTS - Ostomy and/or Continence Assessment and Care []  - 0 Incontinence Assessment and Management []  - 0 Ostomy Care Assessment and Management (repouching, etc.) PROCESS - Coordination of Care []  - 0 Simple Patient / Family Education for ongoing care []  - 0 Complex (extensive) Patient / Family Education for ongoing care []  - 0 Staff obtains Chiropractor, Records, T Results / Process Orders est []  - 0 Staff telephones HHA, Nursing Homes / Clarify orders / etc []  - 0 Routine Transfer to another Facility (non-emergent condition) []  - 0 Routine Hospital Admission (non-emergent condition) []  - 0 New Admissions / Manufacturing Engineer / Ordering NPWT Apligraf, etc. , []  - 0 Emergency Hospital Admission (emergent condition) PROCESS - Special Needs []  - 0 Pediatric / Minor Patient Management []  - 0 Isolation Patient Management []  - 0 Hearing / Language / Visual special needs []  - 0 Assessment of Community assistance (transportation, D/C planning, etc.) []  - 0 Additional assistance / Altered mentation []  - 0 Support Surface(s) Assessment (bed, cushion, seat, etc.) INTERVENTIONS - Miscellaneous []  - 0 External ear exam []  - 0 Patient Transfer (multiple staff / Nurse, Adult / Similar devices) []  - 0 Simple Staple / Suture removal (25 or  less) []  - 0 Complex Staple / Suture removal (26 or more) []  - 0 Hypo/Hyperglycemic Management (do not check if billed separately) []  - 0 Ankle / Brachial Index (  ABI) - do not check if billed separately Has the patient been seen at the hospital within the last three years: Yes Total Score: 0 Level Of Care: ____ Electronic Signature(s) Signed: 10/27/2023 5:49:46 PM By: Purcell Sniff Entered By: Purcell Sniff on 10/26/2023 14:06:07 -------------------------------------------------------------------------------- Encounter Discharge Information Details Patient Name: Date of Service: [name], [name] [name] 10/26/2023 1:00 PM Medical Record Number: [id number] Patient Account Number: [id number] Date of Birth/Sex: Treating RN: April 06, 1946 (78 y.o. [name] Primary Care Alden Feagan: Auston Purchase Other Clinician: Purcell Sniff Referring Shelbie Franken: Treating Cabela Pacifico/Extender: Cloyce, Blankenhorn ([id number]) [id number] Page 3 of 8 Weeks in Treatment: 41 Encounter Discharge Information Items Post Procedure Vitals Discharge Condition: Stable Temperature (F): 98.5 Ambulatory Status: Walker Pulse (bpm): 65 Discharge Destination: Home Respiratory Rate (breaths/min): 18 Transportation: Private Auto Blood Pressure (mmHg): 135/61 Accompanied By: self Schedule Follow-up Appointment: Yes Clinical Summary of Care: Electronic Signature(s) Signed: 10/27/2023 5:49:46 PM By: Purcell Sniff Entered By: Purcell Sniff on 10/26/2023 15:35:17 -------------------------------------------------------------------------------- Lower Extremity Assessment Details Patient Name: Date of Service: [name], [name] [name] 10/26/2023 1:00 PM Medical Record Number: [id number] Patient Account Number: [id number] Date of Birth/Sex: Treating RN: November 26, 1945 (78 y.o. [name] Primary Care Jarrid Lienhard: Auston Purchase Other Clinician: Purcell Sniff Referring  Gaspare Netzel: Treating Kailei Cowens/Extender: Bethena Andre Auston Purchase Weeks in Treatment: 70 Edema Assessment Assessed: [Left: Yes] [Right: No] Edema: [Left: Ye] [Right: s] Calf Left: Right: Point of Measurement: 37 cm From Medial Instep 41.4 cm Ankle Left: Right: Point of Measurement: 10 cm From Medial Instep 23 cm Knee To Floor Left: Right: From Medial Instep 53 cm Vascular Assessment Pulses: Dorsalis Pedis Palpable: [Left:Yes] Extremity colors, hair growth, and conditions: Extremity Color: [Left:Normal] Hair Growth on Extremity: [Left:Yes] Temperature of Extremity: [Left:Cool > 3 seconds] Toe Nail Assessment Left: Right: Thick: Yes Discolored: No Deformed: No Improper Length and Hygiene: No [name], [name] ([id number]) [id number] Page 4 of 8 Electronic Signature(s) Signed: 10/27/2023 5:32:36 PM By: [name] MSN RN CNS WTA Signed: 10/27/2023 5:49:46 PM By: Purcell Sniff Entered By: Purcell Sniff on 10/26/2023 13:24:39 -------------------------------------------------------------------------------- Multi Wound Chart Details Patient Name: Date of Service: [name], [name] [name] 10/26/2023 1:00 PM Medical Record Number: [id number] Patient Account Number: [id number] Date of Birth/Sex: Treating RN: 09/21/1946 (78 y.o. [name] Primary Care Lichelle Viets: Auston Purchase Other Clinician: Purcell Sniff Referring Casandra Dallaire: Treating Georgeanne Frankland/Extender: Bethena Andre [place]on Purchase Weeks in Treatment: 71 Vital Signs Height(in): 70 Pulse(bpm): 65 Weight(lbs): 265 Blood Pressure(mmHg): 135/61 Body Mass Index(BMI): 38 Temperature(F): 98.5 Respiratory Rate(breaths/min): 18 [17:Photos:] [N/A:N/A] Left Calcaneus N/A N/A Wound Location: Pressure Injury N/A N/A Wounding Event: Pressure Ulcer N/A N/A Primary Etiology: Cataracts, Arrhythmia, Coronary N/A N/A Comorbid History: Artery Disease, Hypertension, Type II Diabetes, Osteoarthritis,  Neuropathy 10/11/2023 N/A N/A Date Acquired: 0 N/A N/A Weeks of Treatment: Open N/A N/A Wound Status: No N/A N/A Wound Recurrence: 0.6x1.5x0.1 N/A N/A Measurements L x W x D (cm) 0.707 N/A N/A A (cm) : rea 0.071 N/A N/A Volume (cm) : Category/Stage I N/A N/A Classification: Small N/A N/A Exudate A mount: Serosanguineous N/A N/A Exudate Type: red, brown N/A N/A Exudate Color: Fat Layer (Subcutaneous Tissue): Yes N/A N/A Exposed Structures: Fascia: No Tendon: No Muscle: No Joint: No Bone: No Treatment Notes Electronic Signature(s) Signed: 10/27/2023 5:49:46 PM By: Purcell Sniff [name] ([id number]) [id number] Page 5 of 8 Entered By: Purcell Sniff on 10/26/2023 13:24:45 -------------------------------------------------------------------------------- Multi-Disciplinary Care Plan Details Patient Name: Date of Service: [name], [name] [name]  10/26/2023 1:00 PM Medical Record Number: [id number] Patient Account Number: [id number] Date of Birth/Sex: Treating RN: March 20, 1946 (77 y.o. [name] Primary Care Courtnei Ruddell: Auston Purchase Other Clinician: Purcell Sniff Referring Abron Neddo: Treating Jamaar Howes/Extender: Bethena Andre [place]on [place] Weeks in Treatment: 110 Active Inactive Electronic Signature(s) Signed: 10/27/2023 5:32:36 PM By: [name] MSN RN CNS WTA Signed: 10/27/2023 5:49:46 PM By: Purcell Sniff Entered By: Purcell Sniff on 10/26/2023 14:06:17 -------------------------------------------------------------------------------- Pain Assessment Details Patient Name: Date of Service: [name], [name] [name] 10/26/2023 1:00 PM Medical Record Number: [id number] Patient Account Number: [id number] Date of Birth/Sex: Treating RN: [birth year] (78 y.o. [name] Primary Care Danaiya Steadman: Auston Purchase Other Clinician: Purcell Sniff Referring Morningstar Toft: Treating Jameel Quant/Extender: Bethena Andre [place] Weeks in Treatment:  69 Active Problems Location of Pain Severity and Description of Pain Patient Has Paino No Site Locations [name], [name] ([id number]) [phone].pdf Page 6 of 8 Pain Management and Medication Current Pain Management: Electronic Signature(s) Signed: 10/27/2023 5:32:36 PM By: [name] MSN RN CNS WTA Signed: 10/27/2023 5:49:46 PM By: Purcell Sniff Entered By: Purcell Sniff on 10/26/2023 13:12:16 -------------------------------------------------------------------------------- Patient/Caregiver Education Details Patient Name: Date of Service: [name], [name] [name] 1/7/2025andnbsp1:00 PM Medical Record Number: [id number] Patient Account Number: [id number] Date of Birth/Gender: Treating RN: 20-May-1946 (78 y.o. [name] Primary Care Physician: Auston Purchase Other Clinician: Purcell Sniff Referring Physician: Treating Physician/Extender: Bethena Andre Auston Purchase Devra in Treatment: 35 Education Assessment Education Provided To: Patient Education Topics Provided Wound/Skin Impairment: Handouts: Other: continue wound care as directed Methods: Explain/Verbal Responses: State content correctly Electronic Signature(s) Signed: 10/27/2023 5:49:46 PM By: Purcell Sniff Entered By: Purcell Sniff on 10/26/2023 15:34:16 [name] ([id number]) [id number] Page 7 of 8 -------------------------------------------------------------------------------- Wound Assessment Details Patient Name: Date of Service: [name], [name] [name] 10/26/2023 1:00 PM Medical Record Number: [id number] Patient Account Number: [id number] Date of Birth/Sex: Treating RN: August 12, 1946 (77 y.o. [name] Primary Care Shayn Madole: Auston Purchase Other Clinician: Purcell Sniff Referring Maverik Foot: Treating Josejulian Tarango/Extender: Bethena Andre [place] Weeks in Treatment: 70 Wound Status Wound Number: 17 Primary Pressure Ulcer Etiology: Wound Location:  Left Calcaneus Wound Open Wounding Event: Pressure Injury Status: Date Acquired: 10/11/2023 Comorbid Cataracts, Arrhythmia, Coronary Artery Disease, Hypertension, Weeks Of Treatment: 0 History: Type II Diabetes, Osteoarthritis, Neuropathy Clustered Wound: No Photos Wound Measurements Length: (cm) Width: (cm) Depth: (cm) Area: (cm) Volume: (cm) 0.6 % Reduction in Area: 1.5 % Reduction in Volume: 0.1 0.707 0.071 Wound Description Classification: Category/Stage I Exudate Amount: Small Exudate Type: Serosanguineous Exudate Color: red, brown Foul Odor After Cleansing: No Slough/Fibrino Yes Wound Bed Exposed Structure Fascia Exposed: No Fat Layer (Subcutaneous Tissue) Exposed: Yes Tendon Exposed: No Muscle Exposed: No Joint Exposed: No Bone Exposed: No Treatment Notes Wound #17 (Calcaneus) Wound Laterality: Left Cleanser Normal Saline Discharge Instruction: Wash your hands with soap and water. Remove old dressing, discard into plastic bag and place into trash. Cleanse the wound with Normal Saline prior to applying a clean dressing using gauze sponges, not tissues or cotton balls. Do not scrub or use excessive force. Pat dry using gauze sponges, not tissue or cotton balls. [name], [name] ([id number]) [id number] Page 8 of 8 Soap and Water Discharge Instruction: Gently cleanse wound with antibacterial soap, rinse and pat dry prior to dressing wounds Peri-Wound Care Topical Primary Dressing Hydrofera Blue Ready Transfer Foam, 2.5x2.5 (in/in) Discharge Instruction: Apply Hydrofera Blue Ready to wound bed as directed Secondary Dressing ABD Pad 5x9 (in/in) Discharge Instruction: Cover with ABD pad Secured  With Compression Wrap Urgo K2 Lite, two layer compression system, regular Compression Stockings Add-Ons Electronic Signature(s) Signed: 10/27/2023 5:32:36 PM By: [name] MSN RN CNS WTA Signed: 10/27/2023 5:49:46 PM By: Purcell Sniff Entered By: Purcell Sniff on 10/26/2023 13:22:45 -------------------------------------------------------------------------------- Vitals Details Patient Name: Date of Service: [name], [name] [name] 10/26/2023 1:00 PM Medical Record Number: [id number] Patient Account Number: [id number] Date of Birth/Sex: Treating RN: [birth year] (78 y.o. [name] Primary Care Jaloni Sorber: Auston Purchase Other Clinician: Purcell Sniff Referring Christien Frankl: Treating Amaurie Schreckengost/Extender: Bethena Andre [place] Weeks in Treatment: 60 Vital Signs Time Taken: 13:08 Temperature (F): 98.5 Height (in): 70 Pulse (bpm): 65 Weight (lbs): 265 Respiratory Rate (breaths/min): 18 Body Mass Index (BMI): 38 Blood Pressure (mmHg): 135/61 Reference Range: 80 - 120 mg / dl Electronic Signature(s) Signed: 10/27/2023 5:49:46 PM By: Purcell Sniff Entered By: Purcell Sniff on 10/26/2023 13:11:43

## 2023-10-29 DIAGNOSIS — Z7901 Long term (current) use of anticoagulants: Secondary | ICD-10-CM | POA: Diagnosis not present

## 2023-10-29 DIAGNOSIS — Z952 Presence of prosthetic heart valve: Secondary | ICD-10-CM | POA: Diagnosis not present

## 2023-11-02 ENCOUNTER — Encounter: Payer: PPO | Admitting: Physician Assistant

## 2023-11-02 DIAGNOSIS — E11621 Type 2 diabetes mellitus with foot ulcer: Secondary | ICD-10-CM | POA: Diagnosis not present

## 2023-11-02 DIAGNOSIS — L89621 Pressure ulcer of left heel, stage 1: Secondary | ICD-10-CM | POA: Diagnosis not present

## 2023-11-03 DIAGNOSIS — I10 Essential (primary) hypertension: Secondary | ICD-10-CM | POA: Diagnosis not present

## 2023-11-03 DIAGNOSIS — M1711 Unilateral primary osteoarthritis, right knee: Secondary | ICD-10-CM | POA: Diagnosis not present

## 2023-11-03 DIAGNOSIS — I059 Rheumatic mitral valve disease, unspecified: Secondary | ICD-10-CM | POA: Diagnosis not present

## 2023-11-03 DIAGNOSIS — T826XXD Infection and inflammatory reaction due to cardiac valve prosthesis, subsequent encounter: Secondary | ICD-10-CM | POA: Diagnosis not present

## 2023-11-03 DIAGNOSIS — Z952 Presence of prosthetic heart valve: Secondary | ICD-10-CM | POA: Diagnosis not present

## 2023-11-03 DIAGNOSIS — I6523 Occlusion and stenosis of bilateral carotid arteries: Secondary | ICD-10-CM | POA: Diagnosis not present

## 2023-11-03 DIAGNOSIS — E118 Type 2 diabetes mellitus with unspecified complications: Secondary | ICD-10-CM | POA: Diagnosis not present

## 2023-11-03 DIAGNOSIS — E538 Deficiency of other specified B group vitamins: Secondary | ICD-10-CM | POA: Diagnosis not present

## 2023-11-03 DIAGNOSIS — E0590 Thyrotoxicosis, unspecified without thyrotoxic crisis or storm: Secondary | ICD-10-CM | POA: Diagnosis not present

## 2023-11-03 DIAGNOSIS — I2510 Atherosclerotic heart disease of native coronary artery without angina pectoris: Secondary | ICD-10-CM | POA: Diagnosis not present

## 2023-11-03 DIAGNOSIS — I480 Paroxysmal atrial fibrillation: Secondary | ICD-10-CM | POA: Diagnosis not present

## 2023-11-03 DIAGNOSIS — K219 Gastro-esophageal reflux disease without esophagitis: Secondary | ICD-10-CM | POA: Diagnosis not present

## 2023-11-03 NOTE — Progress Notes (Addendum)
 RAZA, BAYLESS ([id number]) [id number] Page 1 of 7 Visit Report for 11/02/2023 Chief Complaint Document Details Patient Name: Date of Service: [name], [name] 11/02/2023 3:00 PM Medical Record Number: [id number] Patient Account Number: [id number] Date of Birth/Sex: Treating RN: Sep 29, 1946 (78 y.o. [name]) Alverta Sailors Primary Care Provider: Auston Purchase Other Clinician: Purcell Sniff Referring Provider: Treating Provider/Extender: Bethena Andre Auston Purchase Devra in Treatment: 50 Information Obtained from: Patient Chief Complaint Left heel ulcer Electronic Signature(s) Signed: 11/02/2023 3:05:26 PM By: Bethena Andre PA-C Entered By: Bethena Andre on 11/02/2023 15:05:26 -------------------------------------------------------------------------------- Debridement Details Patient Name: Date of Service: [name], [name] 11/02/2023 3:00 PM Medical Record Number: [id number] Patient Account Number: [id number] Date of Birth/Sex: Treating RN: May 29, 1946 (78 y.o. [name]) Alverta Sailors Primary Care Provider: Auston Purchase Other Clinician: Purcell Sniff Referring Provider: Treating Provider/Extender: Bethena Andre Auston Purchase Devra in Treatment: 28 Debridement Performed for Assessment: Wound #17 Left Calcaneus Performed By: Physician Bethena Andre, PA-C The following information was scribed by: Purcell Sniff The information was scribed for: Bethena Andre Debridement Type: Debridement Level of Consciousness (Pre-procedure): Awake and Alert Pre-procedure Verification/Time Out Yes - 15:40 Taken: Start Time: 15:40 Percent of Wound Bed Debrided: 100% T Area Debrided (cm): otal 0.75 Tissue and other material debrided: Viable, Non-Viable, Slough, Subcutaneous, Slough Level: Skin/Subcutaneous Tissue Debridement Description: Excisional Instrument: Curette Bleeding: Minimum Hemostasis Achieved: Pressure Response to Treatment: Procedure was tolerated well Level of  Consciousness BertieVLADISLAV, AXELSON ([id number]) [phone].pdf Page 2 of 7 Level of Consciousness (Post- Awake and Alert procedure): Post Debridement Measurements of Total Wound Length: (cm) 0.5 Stage: Category/Stage I Width: (cm) 1.9 Depth: (cm) 0.1 Volume: (cm) 0.075 Character of Wound/Ulcer Post Debridement: Stable Post Procedure Diagnosis Same as Pre-procedure Electronic Signature(s) Signed: 11/02/2023 4:44:02 PM By: Bethena Andre PA-C Signed: 11/02/2023 4:52:51 PM By: Purcell Sniff Signed: 11/05/2023 8:18:57 AM By: Alverta Sailors RN Entered By: Purcell Sniff on 11/02/2023 15:41:08 -------------------------------------------------------------------------------- HPI Details Patient Name: Date of Service: [name], [name] 11/02/2023 3:00 PM Medical Record Number: [id number] Patient Account Number: [id number] Date of Birth/Sex: Treating RN: [name] 27, 1946 (78 y.o. NETTY Alverta Sailors Primary Care Provider: Auston Purchase Other Clinician: Purcell Sniff Referring Provider: Treating Provider/Extender: Bethena Andre Auston Purchase Devra in Treatment: 48 History of Present Illness HPI Description: Readmission: 06-19-2022 upon evaluation today patient appears to be doing well currently in regard to his heel ulcer all things considered. He unfortunately is having a bit of breakdown in general as far as the heel is concerned not nearly as bad as what we noted last time he was here in the clinic. The good news is I do think that this should hopefully heal much more rapidly than what we noted last time. His past medical history has not changed he is on Coumadin . 11-02-2023 upon evaluation today patient appears to be doing well currently in regard to his wound. Has been tolerating the dressing changes in fact he appears to be doing much better after last week's appointment I am extremely pleased with where we stand. I think that he is tolerating the compression  wrapping without complication and this is awesome news as well. I think that the Hydrofera Blue is doing a really good job here. Electronic Signature(s) Signed: 11/02/2023 4:02:58 PM By: Bethena Andre PA-C Entered By: Bethena Andre on 11/02/2023 16:02:57 -------------------------------------------------------------------------------- Physical Exam Details Patient Name: Date of Service: [name], [name] 11/02/2023 3:00 PM GATES, JIVIDEN ([id number]) [phone].pdf Page 3 of 7 Medical Record Number: [id number]  Patient Account Number: [id number] Date of Birth/Sex: Treating RN: September 23, 1946 (78 y.o. M) Alverta Sailors Primary Care Provider: Auston Purchase Other Clinician: Purcell Sniff Referring Provider: Treating Provider/Extender: Bethena Andre Auston Purchase Devra in Treatment: 17 Constitutional Well-nourished and well-hydrated in no acute distress. Respiratory normal breathing without difficulty. Psychiatric this patient is able to make decisions and demonstrates good insight into disease process. Alert and Oriented x 3. pleasant and cooperative. Notes Upon inspection patient's wound bed actually showed signs of good granulation epithelization at this point. Fortunately I do not see any evidence of infection which is great news and in general I think that he is tolerating the dressing changes without complication. I did perform debridement clearway necrotic debris he tolerated that today without complication postdebridement wound bed is significantly improved. Electronic Signature(s) Signed: 11/02/2023 4:03:34 PM By: Bethena Andre PA-C Entered By: Bethena Andre on 11/02/2023 16:03:34 -------------------------------------------------------------------------------- Physician Orders Details Patient Name: Date of Service: [name], [name] 11/02/2023 3:00 PM Medical Record Number: [id number] Patient Account Number: [id number] Date of Birth/Sex: Treating RN: May 27, 1946  (78 y.o. 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 Bathing/ [employer] wounds with antibacterial soap and water. May shower; gently cleanse wound with antibacterial soap, rinse and pat dry prior to dressing wounds Anesthetic (Use 'Patient Medications' Section for Anesthetic Order Entry) Lidocaine  applied to wound bed - as needed Off-Loading Open toe surgical shoe - offloading heel surgical shoe left foot Wound Treatment Wound #17 - Calcaneus Wound Laterality: Left Cleanser: Normal Saline CARTHEL, CASTILLE ([id number]) [phone].pdf Page 4 of 7 Discharge Instructions: Wash your hands with soap and water. Remove old dressing, discard into plastic bag and place into trash. Cleanse the wound with Normal Saline prior to applying a clean dressing using gauze sponges, not tissues or cotton balls. Do not scrub or use excessive force. Pat dry using gauze sponges, not tissue or cotton balls. Cleanser: Soap and Water Discharge Instructions: Gently cleanse wound with antibacterial soap, rinse and pat dry prior to dressing wounds Prim Dressing: Hydrofera Blue Ready Transfer Foam, 2.5x2.5 (in/in) ary Discharge Instructions: Apply Hydrofera Blue Ready to wound bed as directed Secondary Dressing: ABD Pad 5x9 (in/in) Discharge Instructions: Cover with ABD pad Compression Wrap: Urgo K2 Lite, two layer  compression system, regular Electronic Signature(s) Signed: 11/02/2023 4:44:02 PM By: Bethena Andre PA-C Signed: 11/02/2023 4:52:51 PM By: Purcell Sniff Entered By: Purcell Sniff on 11/02/2023 15:43:23 -------------------------------------------------------------------------------- Problem List Details Patient Name: Date of Service: [name], [name] 11/02/2023 3:00 PM Medical Record Number: [id number] Patient Account Number: [id number] Date of Birth/Sex: Treating RN: [birth year] (77 y.o. NETTY Alverta Sailors Primary Care Provider: Auston Purchase Other Clinician: Purcell Sniff Referring Provider: Treating Provider/Extender: Bethena Andre Auston Purchase Devra in Treatment: 17 Active Problems ICD-10 Encounter Code Description Active Date MDM Diagnosis E11.621 Type 2 diabetes mellitus with foot ulcer 06/19/2022 No Yes L97.422 Non-pressure chronic ulcer of left heel and midfoot with fat layer exposed 10/26/2023 No Yes E11.42 Type 2 diabetes mellitus with diabetic polyneuropathy 06/19/2022 No Yes Inactive Problems Resolved  Problems ICD-10 Code Description Active Date Resolved Date L97.522 Non-pressure chronic ulcer of other part of left foot with fat layer exposed 06/19/2022 06/19/2022 Electronic Signature(s) LC, JOYNT ([id number]) [id number] Page 5 of 7 Signed: 11/02/2023 3:05:22 PM By: Bethena Ferraris PA-C Entered By: Bethena Ferraris on 11/02/2023 15:05:22 -------------------------------------------------------------------------------- Progress Note Details Patient Name: Date of Service: [name], [name] 11/02/2023 3:00 PM Medical Record Number: [id number] Patient Account Number: [id number] Date of Birth/Sex: Treating RN: [birth year] (77 y.o. [name]) Alverta Sailors Primary Care Provider: Auston Purchase Other Clinician: Purcell Sniff Referring Provider: Treating Provider/Extender: Bethena Ferraris Auston Purchase Devra in Treatment: 82 Subjective Chief  Complaint Information obtained from Patient Left heel ulcer History of Present Illness (HPI) Readmission: 06-19-2022 upon evaluation today patient appears to be doing well currently in regard to his heel ulcer all things considered. He unfortunately is having a bit of breakdown in general as far as the heel is concerned not nearly as bad as what we noted last time he was here in the clinic. The good news is I do think that this should hopefully heal much more rapidly than what we noted last time. His past medical history has not changed he is on Coumadin . 11-02-2023 upon evaluation today patient appears to be doing well currently in regard to his wound. Has been tolerating the dressing changes in fact he appears to be doing much better after last week's appointment I am extremely pleased with where we stand. I think that he is tolerating the compression wrapping without complication and this is awesome news as well. I think that the Hydrofera Blue is doing a really good job here. Objective Constitutional Well-nourished and well-hydrated in no acute distress. Vitals Time Taken: 3:12 PM, Height: 70 in, Weight: 265 lbs, BMI: 38, Temperature: 98.8 F, Pulse: 70 bpm, Respiratory Rate: 18 breaths/min, Blood Pressure: 117/59 mmHg. Respiratory normal breathing without difficulty. Psychiatric this patient is able to make decisions and demonstrates good insight into disease process. Alert and Oriented x 3. pleasant and cooperative. General Notes: Upon inspection patient's wound bed actually showed signs of good granulation epithelization at this point. Fortunately I do not see any evidence of infection which is great news and in general I think that he is tolerating the dressing changes without complication. I did perform debridement clearway necrotic debris he tolerated that today without complication postdebridement wound bed is significantly improved. Integumentary (Hair, Skin) Wound #17 status is Open.  Original cause of wound was Pressure Injury. The date acquired was: 10/11/2023. The wound has been in treatment 1 weeks. The wound is located on the Left Calcaneus. The wound measures 0.5cm length x 1.9cm width x 0.1cm depth; 0.746cm^2 area and 0.075cm^3 volume. There is Fat Layer (Subcutaneous Tissue) exposed. There is a small amount of serosanguineous drainage noted. 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 There was a Excisional Skin/Subcutaneous Tissue Debridement with a total area of 0.75 sq cm performed by Bethena Ferraris, PA-C. With the following instrument(s): Curette to remove Viable and Non-Viable tissue/material. Material removed includes Subcutaneous Tissue and Slough and. A time out was conducted at 15:40, prior to the start of the procedure. A Minimum amount of bleeding was controlled with Pressure. The procedure  was tolerated well. Post Debridement Measurements: 0.5cm length x 1.9cm width x 0.1cm depth; 0.075cm^3 volume. Post debridement Stage noted as Category/Stage I. Character of Wound/Ulcer Post Debridement is stable. Post procedure Diagnosis Wound #17: Same as Pre-Procedure Pre-procedure diagnosis of Wound #17 is a Pressure Ulcer located on the Left Calcaneus . There was a Double Layer Compression Therapy Procedure with a pre-treatment ABI of 1 by Purcell Sniff. Post procedure Diagnosis Wound #17: Same as Pre-Procedure Plan Follow-up Appointments: Return Appointment in 1 week. Bathing/ Shower/ Hygiene: Wash wounds with antibacterial soap and water. May shower; gently cleanse wound with antibacterial soap, rinse and pat dry  prior to dressing wounds Anesthetic (Use 'Patient Medications' Section for Anesthetic Order Entry): Lidocaine  applied to wound bed - as needed Off-Loading: Open toe surgical shoe - offloading heel surgical shoe left foot WOUND #17: - Calcaneus Wound Laterality: Left Cleanser: Normal Saline Discharge Instructions: Wash your hands with soap and water. Remove old dressing, discard into plastic bag and place into trash. Cleanse the wound with Normal Saline prior to applying a clean dressing using gauze sponges, not tissues or cotton balls. Do not scrub or use excessive force. Pat dry using gauze sponges, not tissue or cotton balls. Cleanser: Soap and Water Discharge Instructions: Gently cleanse wound with antibacterial soap, rinse and pat dry prior to dressing wounds Prim Dressing: Hydrofera Blue Ready Transfer Foam, 2.5x2.5 (in/in) ary Discharge Instructions: Apply Hydrofera Blue Ready to wound bed as directed Secondary Dressing: ABD Pad 5x9 (in/in) Discharge Instructions: Cover with ABD pad Com pression Wrap: Urgo K2 Lite, two layer compression system, regular 1. I would recommend that the patient should continue to monitor for any signs of infection or worsening. Based on what I am seeing I think there were making really good headway here towards closure. 2. I am going to recommend that he also continue to utilize the compression wrapping which I think is doing a great job. 3. I am also going to recommend that the patient should continue to elevate his legs much as possible to help with edema control. We will see patient back for reevaluation in 1 week here in the clinic. If anything worsens or changes patient will contact our office for additional recommendations. Electronic Signature(s) Signed: 11/02/2023 4:04:34 PM By: Bethena Ferraris PA-C Entered By: Bethena Ferraris on 11/02/2023 16:04:34 ERIK ZACHARY PARAS ([id number]) [id number] Page 7 of  7 -------------------------------------------------------------------------------- SuperBill Details Patient Name: Date of Service: [name] DIMITRI COMMODORE [facility] 11/02/2023 Medical Record Number: [id number] Patient Account Number: [id number] Date of Birth/Sex: Treating RN: 15-Aug-1946 (77 y.o. 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

## 2023-11-03 NOTE — Progress Notes (Addendum)
 WILLETT, LEFEBER ([id number]) [id number] Page 1 of 9 Visit Report for 11/02/2023 Arrival Information Details Patient Name: Date of Service: [name], [name] [name] 11/02/2023 3:00 PM Medical Record Number: [id number] Patient Account Number: [id number] Date of Birth/Sex: Treating RN: 1945/11/04 (78 y.o. [name]) Alverta [name] Primary Care [place] Zaragoza: Auston Purchase Other Clinician: Purcell Sniff Referring Terrian Sentell: Treating Mykelti Goldenstein/Extender: Bethena Andre Auston Purchase Devra in Treatment: 26 Visit Information History Since Last Visit All ordered tests and consults were completed: No Patient Arrived: [name] Added or deleted any medications: No Arrival Time: 15:02 Any new allergies or adverse reactions: No Transfer Assistance: None Had a fall or experienced change in No Patient Identification Verified: Yes activities of daily living that may affect Secondary Verification Process Completed: Yes risk of falls: Patient Requires Transmission-Based Precautions: No Signs or symptoms of abuse/neglect since last visito No Patient Has Alerts: Yes Hospitalized since last visit: No Patient Alerts: Patient on Blood Thinner Implantable device outside of the clinic excluding No Warfarin cellular tissue based products placed in the center Type II Diabetic since last visit: Has Dressing in Place as Prescribed: Yes Has Compression in Place as Prescribed: Yes Pain Present Now: No Electronic Signature(s) Signed: 11/02/2023 4:52:51 PM By: Purcell Sniff Entered By: Purcell Sniff on 11/02/2023 15:12:34 -------------------------------------------------------------------------------- Clinic Level of Care Assessment Details Patient Name: Date of Service: [name] [facility] J. 11/02/2023 3:00 PM Medical Record Number: [id number] Patient Account Number: [id number] Date of Birth/Sex: Treating RN: [birth year] (78 y.o. [name]) Alverta [name] Primary Care Steve Youngberg: Auston Purchase Other Clinician:  Purcell Sniff Referring Sharonna Vinje: Treating Omran Keelin/Extender: Bethena Andre Auston Purchase Devra in Treatment: 52 Clinic Level of Care Assessment Items TOOL 1 Quantity Score []  - 0 Use when EandM and Procedure is performed on INITIAL visit ASSESSMENTS - Nursing Assessment / Reassessment []  - 0 General Physical Exam (combine w/ comprehensive assessment (listed just below) when performed on new pt. 84 [name] ([id number]) [id number] Page 2 of 9 []  - 0 Comprehensive Assessment (HX, ROS, Risk Assessments, Wounds Hx, etc.) ASSESSMENTS - Wound and Skin Assessment / Reassessment []  - 0 Dermatologic / Skin Assessment (not related to wound area) ASSESSMENTS - Ostomy and/or Continence Assessment and Care []  - 0 Incontinence Assessment and Management []  - 0 Ostomy Care Assessment and Management (repouching, etc.) PROCESS - Coordination of Care []  - 0 Simple Patient / Family Education for ongoing care []  - 0 Complex (extensive) Patient / Family Education for ongoing care []  - 0 Staff obtains Chiropractor, Records, T Results / Process Orders est []  - 0 Staff telephones HHA, Nursing Homes / Clarify orders / etc []  - 0 Routine Transfer to another Facility (non-emergent condition) []  - 0 Routine Hospital Admission (non-emergent condition) []  - 0 New Admissions / Manufacturing Engineer / Ordering NPWT Apligraf, etc. , []  - 0 Emergency Hospital Admission (emergent condition) PROCESS - Special Needs []  - 0 Pediatric / Minor Patient Management []  - 0 Isolation Patient Management []  - 0 Hearing / Language / Visual special needs []  - 0 Assessment of Community assistance (transportation, D/C planning, etc.) []  - 0 Additional assistance / Altered mentation []  - 0 Support Surface(s) Assessment (bed, cushion, seat, etc.) INTERVENTIONS - Miscellaneous []  - 0 External ear exam []  - 0 Patient Transfer (multiple staff / Nurse, Adult / Similar  devices) []  - 0 Simple Staple / Suture removal (25 or less) []  - 0 Complex Staple / Suture removal (26 or more) []  - 0 Hypo/Hyperglycemic Management (do not check if billed separately) []  -  0 Ankle / Brachial Index (ABI) - do not check if billed separately Has the patient been seen at the hospital within the last three years: Yes Total Score: 0 Level Of Care: ____ Electronic Signature(s) Signed: 11/02/2023 4:52:51 PM By: Purcell Sniff Entered By: Purcell Sniff on 11/02/2023 15:43:30 -------------------------------------------------------------------------------- Compression Therapy Details Patient Name: Date of Service: [name], [name] [name] 11/02/2023 3:00 PM Medical Record Number: [id number] Patient Account Number: [id number] Date of Birth/Sex: Treating RN: Nov 08, 1945 (78 y.o. [name] Primary Care Malayjah Otoole: Auston Purchase Other Clinician: Willy, Pinkerton ([id number]) [id number] Page 3 of 9 Referring Annetta Deiss: Treating [name]/Extender: Bethena Andre [place]on [place] Devra in Treatment: 71 Compression Therapy Performed for Wound Assessment: Wound #17 Left Calcaneus Performed By: Lindi Purcell, Angie, Compression Type: Double Layer Pre Treatment ABI: 1 Post Procedure Diagnosis Same as Pre-procedure Electronic Signature(s) Signed: 11/02/2023 4:52:51 PM By: Purcell Sniff Entered By: Purcell Sniff on 11/02/2023 15:40:00 -------------------------------------------------------------------------------- Encounter Discharge Information Details Patient Name: Date of Service: [name], [name] [name] 11/02/2023 3:00 PM Medical Record Number: [id number] Patient Account Number: [id number] Date of Birth/Sex: Treating RN: 09/02/1946 (78 y.o. [name] Primary Care [name]: Auston Purchase Other Clinician: Purcell Sniff Referring Uzoma Vivona: Treating Solara Goodchild/Extender: Bethena Andre [place]on [place] Devra in Treatment: 79 Encounter  Discharge Information Items Post Procedure Vitals Discharge Condition: Stable Temperature (F): 98.8 Ambulatory Status: Walker Pulse (bpm): 70 Discharge Destination: Home Respiratory Rate (breaths/min): 18 Transportation: Private Auto Blood Pressure (mmHg): 117/59 Accompanied By: self Schedule Follow-up Appointment: Yes Clinical Summary of Care: Electronic Signature(s) Signed: 11/02/2023 4:52:51 PM By: Purcell Sniff Entered By: Purcell Sniff on 11/02/2023 16:51:29 -------------------------------------------------------------------------------- Lower Extremity Assessment Details Patient Name: Date of Service: [name], [name] [name] 11/02/2023 3:00 PM Medical Record Number: [id number] Patient Account Number: [id number] Date of Birth/Sex: Treating RN: 07/03/1946 (77 y.o. [name] Primary Care Norris Bodley: Auston Purchase Other Clinician: Purcell Sniff Referring Bette Brienza: Treating Ryosuke Ericksen/Extender: Bethena Andre [place] Devra in Treatment: [address] [place] J ([id number]) [id number] Page 4 of 9 Edema Assessment Assessed: [Left: Yes] [Right: No] Edema: [Left: Ye] [Right: s] Calf Left: Right: Point of Measurement: 37 cm From Medial Instep 39 cm Ankle Left: Right: Point of Measurement: 10 cm From Medial Instep 22.5 cm Knee To Floor Left: Right: From Medial Instep 53 cm Vascular Assessment Pulses: Dorsalis Pedis Palpable: [Left:Yes] Extremity colors, hair growth, and conditions: Extremity Color: [Left:Normal] Hair Growth on Extremity: [Left:Yes] Temperature of Extremity: [Left:Warm < 3 seconds] Toe Nail Assessment Left: Right: Thick: Yes Discolored: Yes Deformed: Yes Improper Length and Hygiene: No Electronic Signature(s) Signed: 11/02/2023 4:52:51 PM By: Purcell Sniff Signed: 11/05/2023 8:18:57 AM By: Alverta Sailors RN Entered By: Purcell Sniff on 11/02/2023  15:28:22 -------------------------------------------------------------------------------- Multi Wound Chart Details Patient Name: Date of Service: [name], [name] [name] 11/02/2023 3:00 PM Medical Record Number: [id number] Patient Account Number: [id number] Date of Birth/Sex: Treating RN: 01/24/1946 (77 y.o. [name] Primary Care Karthikeya Funke: Auston Purchase Other Clinician: Purcell Sniff Referring Margret Moat: Treating Izzak Fries/Extender: Bethena Andre [place] Devra in Treatment: 71 Vital Signs Height(in): 70 Pulse(bpm): 70 Weight(lbs): 265 Blood Pressure(mmHg): 117/59 Body Mass Index(BMI): 38 Temperature(F): 98.8 Respiratory Rate(breaths/min): 18 FAITH, BRANAN ([id number]) [17:Photos:] [N/A:N/A] Left Calcaneus N/A N/A Wound Location: Pressure Injury N/A N/A Wounding Event: Pressure Ulcer N/A N/A Primary Etiology: Cataracts, Arrhythmia, Coronary N/A N/A Comorbid History: Artery Disease, Hypertension, Type II Diabetes, Osteoarthritis, Neuropathy 10/11/2023 N/A N/A Date Acquired: 1 N/A N/A Weeks of Treatment: Open N/A N/A Wound Status: No N/A N/A  Wound Recurrence: 0.5x1.9x0.1 N/A N/A Measurements L x W x D (cm) 0.746 N/A N/A A (cm) : rea 0.075 N/A N/A Volume (cm) : -5.50% N/A N/A % Reduction in A rea: -5.60% N/A N/A % Reduction in Volume: Category/Stage I N/A N/A Classification: Small N/A N/A Exudate A mount: Serosanguineous N/A N/A Exudate Type: red, brown N/A N/A Exudate Color: Fat Layer (Subcutaneous Tissue): Yes N/A N/A Exposed Structures: Fascia: No Tendon: No Muscle: No Joint: No Bone: No Treatment Notes Electronic Signature(s) Signed: 11/02/2023 4:52:51 PM By: Purcell Sniff Entered By: Purcell Sniff on 11/02/2023 15:38:39 -------------------------------------------------------------------------------- Multi-Disciplinary Care Plan Details Patient Name: Date of Service: [name], [name] [name] 11/02/2023 3:00 PM Medical Record Number:  [id number] Patient Account Number: [id number] Date of Birth/Sex: Treating RN: 1946/02/01 (78 y.o. [name]) Alverta [name] Primary Care Zong Mcquarrie: Auston Purchase Other Clinician: Purcell Sniff Referring Rani Sisney: Treating Iley Breeden/Extender: Bethena Andre Auston Purchase Weeks in Treatment: 14 Active Inactive Electronic Signature(s) Signed: 11/02/2023 4:52:51 PM By: Purcell Sniff Signed: 11/05/2023 8:18:57 AM By: Alverta Sailors RN Entered By: Purcell Sniff on 11/02/2023 16:00:16 ERIK ZACHARY PARAS ([id number]) [id number] Page 6 of 9 -------------------------------------------------------------------------------- Pain Assessment Details Patient Name: Date of Service: [name], [name] [name] 11/02/2023 3:00 PM Medical Record Number: [id number] Patient Account Number: [id number] Date of Birth/Sex: Treating RN: Apr 17, 1946 (78 y.o. [name]) Alverta [name] Primary Care Glenis Musolf: Auston Purchase Other Clinician: Purcell Sniff Referring Auguste Tebbetts: Treating Abbye Lao/Extender: Bethena Andre [place] Devra in Treatment: 30 Active Problems Location of Pain Severity and Description of Pain Patient Has Paino No Site Locations Pain Management and Medication Current Pain Management: Electronic Signature(s) Signed: 11/02/2023 4:52:51 PM By: Purcell Sniff Signed: 11/05/2023 8:18:57 AM By: Alverta Sailors RN Entered By: Purcell Sniff on 11/02/2023 15:18:01 -------------------------------------------------------------------------------- Patient/Caregiver Education Details Patient Name: Date of Service: [name], [name] VALEDA ALF 1/14/2025andnbsp3:00 PM Medical Record Number: [id number] Patient Account Number: [id number] Date of Birth/Gender: Treating RN: November 02, 1945 (77 y.o. [name] Primary Care Physician: Auston Purchase Other Clinician: Purcell Sniff Referring Physician: Treating Physician/Extender: Bethena Andre [place]on [place] Devra in Treatment: [address] [place] J ([id number])  [id number] Page 7 of 9 Education Assessment Education Provided To: Patient Education Topics Provided Wound/Skin Impairment: Handouts: Other: continue wound care as directed Methods: Explain/Verbal Responses: State content correctly Electronic Signature(s) Signed: 11/02/2023 4:52:51 PM By: Purcell Sniff Entered By: Purcell Sniff on 11/02/2023 16:50:43 -------------------------------------------------------------------------------- Wound Assessment Details Patient Name: Date of Service: [name], [name] [name] 11/02/2023 3:00 PM Medical Record Number: [id number] Patient Account Number: [id number] Date of Birth/Sex: Treating RN: June 06, 1946 (78 y.o. [name]) Alverta [name] Primary Care Atheena Spano: Auston Purchase Other Clinician: Purcell Sniff Referring Nikkia Devoss: Treating Monserrat Vidaurri/Extender: Bethena Andre Auston Purchase Weeks in Treatment: 71 Wound Status Wound Number: 17 Primary Pressure Ulcer Etiology: Wound Location: Left Calcaneus Wound Open Wounding Event: Pressure Injury Status: Date Acquired: 10/11/2023 Comorbid Cataracts, Arrhythmia, Coronary Artery Disease, Hypertension, Weeks Of Treatment: 1 History: Type II Diabetes, Osteoarthritis, Neuropathy Clustered Wound: No Photos Wound Measurements Length: (cm) 0.5 Width: (cm) 1.9 Depth: (cm) 0.1 Area: (cm) 0.746 Volume: (cm) 0.075 % Reduction in Area: -5.5% % Reduction in Volume: -5.6% Wound Description Classification: Category/Stage I Exudate Amount: Small Exudate Type: Serosanguineous LOGHAN, KURTZMAN ([id number]) Exudate Color: red, brown Foul Odor After Cleansing: No Slough/Fibrino Yes [id number] Page 8 of 9 Wound Bed Exposed Structure Fascia Exposed: No Fat Layer (Subcutaneous Tissue) Exposed: Yes Tendon Exposed: No Muscle Exposed: No Joint Exposed: No Bone Exposed: No Treatment Notes Wound #17 (Calcaneus) Wound Laterality: Left Cleanser Normal  Saline Discharge  Instruction: Wash your hands with soap and water. Remove old dressing, discard into plastic bag and place into trash. Cleanse the wound with Normal Saline prior to applying a clean dressing using gauze sponges, not tissues or cotton balls. Do not scrub or use excessive force. Pat dry using gauze sponges, not tissue or cotton balls. Soap and Water Discharge Instruction: Gently cleanse wound with antibacterial soap, rinse and pat dry prior to dressing wounds Peri-Wound Care Topical Primary Dressing Hydrofera Blue Ready Transfer Foam, 2.5x2.5 (in/in) Discharge Instruction: Apply Hydrofera Blue Ready to wound bed as directed Secondary Dressing ABD Pad 5x9 (in/in) Discharge Instruction: Cover with ABD pad Secured With Compression Wrap Urgo K2 Lite, two layer compression system, regular Compression Stockings Add-Ons Electronic Signature(s) Signed: 11/02/2023 4:52:51 PM By: Purcell Sniff Signed: 11/05/2023 8:18:57 AM By: Alverta Sailors RN Entered By: Purcell Sniff on 11/02/2023 15:26:53 -------------------------------------------------------------------------------- Vitals Details Patient Name: Date of Service: [name], [name] [name] 11/02/2023 3:00 PM Medical Record Number: [id number] Patient Account Number: [id number] Date of Birth/Sex: Treating RN: 1946-08-12 (77 y.o. [name] Primary Care Taggert Bozzi: Auston Purchase Other Clinician: Purcell Sniff Referring Darriel Utter: Treating Heavan Francom/Extender: Bethena Andre [place]on [place] Devra in Treatment: 71 Vital Signs Time Taken: 15:12 Temperature (F): 98.8 Height (in): 70 Pulse (bpm): 70 Weight (lbs): 265 Respiratory Rate (breaths/min): [address] ([id number]) [phone].pdf Page 9 of 9 Body Mass Index (BMI): 38 Blood Pressure (mmHg): 117/59 Reference Range: 80 - 120 mg / dl Electronic Signature(s) Signed: 11/02/2023 4:52:51 PM By: Purcell Sniff Entered By: Purcell Sniff on 11/02/2023  15:17:56

## 2023-11-05 DIAGNOSIS — D0422 Carcinoma in situ of skin of left ear and external auricular canal: Secondary | ICD-10-CM | POA: Diagnosis not present

## 2023-11-05 DIAGNOSIS — D485 Neoplasm of uncertain behavior of skin: Secondary | ICD-10-CM | POA: Diagnosis not present

## 2023-11-05 DIAGNOSIS — L570 Actinic keratosis: Secondary | ICD-10-CM | POA: Diagnosis not present

## 2023-11-09 ENCOUNTER — Encounter: Payer: PPO | Admitting: Physician Assistant

## 2023-11-09 DIAGNOSIS — E11621 Type 2 diabetes mellitus with foot ulcer: Secondary | ICD-10-CM | POA: Diagnosis not present

## 2023-11-09 DIAGNOSIS — L89621 Pressure ulcer of left heel, stage 1: Secondary | ICD-10-CM | POA: Diagnosis not present

## 2023-11-09 DIAGNOSIS — I10 Essential (primary) hypertension: Secondary | ICD-10-CM | POA: Diagnosis not present

## 2023-11-09 DIAGNOSIS — I350 Nonrheumatic aortic (valve) stenosis: Secondary | ICD-10-CM | POA: Diagnosis not present

## 2023-11-09 DIAGNOSIS — E0590 Thyrotoxicosis, unspecified without thyrotoxic crisis or storm: Secondary | ICD-10-CM | POA: Diagnosis not present

## 2023-11-09 DIAGNOSIS — I39 Endocarditis and heart valve disorders in diseases classified elsewhere: Secondary | ICD-10-CM | POA: Diagnosis not present

## 2023-11-09 DIAGNOSIS — T826XXD Infection and inflammatory reaction due to cardiac valve prosthesis, subsequent encounter: Secondary | ICD-10-CM | POA: Diagnosis not present

## 2023-11-09 DIAGNOSIS — Z01818 Encounter for other preprocedural examination: Secondary | ICD-10-CM | POA: Diagnosis not present

## 2023-11-09 DIAGNOSIS — I6523 Occlusion and stenosis of bilateral carotid arteries: Secondary | ICD-10-CM | POA: Diagnosis not present

## 2023-11-09 DIAGNOSIS — I2510 Atherosclerotic heart disease of native coronary artery without angina pectoris: Secondary | ICD-10-CM | POA: Diagnosis not present

## 2023-11-09 DIAGNOSIS — E119 Type 2 diabetes mellitus without complications: Secondary | ICD-10-CM | POA: Diagnosis not present

## 2023-11-09 DIAGNOSIS — Z01810 Encounter for preprocedural cardiovascular examination: Secondary | ICD-10-CM | POA: Diagnosis not present

## 2023-11-09 DIAGNOSIS — J449 Chronic obstructive pulmonary disease, unspecified: Secondary | ICD-10-CM | POA: Diagnosis not present

## 2023-11-09 DIAGNOSIS — D519 Vitamin B12 deficiency anemia, unspecified: Secondary | ICD-10-CM | POA: Diagnosis not present

## 2023-11-09 DIAGNOSIS — K219 Gastro-esophageal reflux disease without esophagitis: Secondary | ICD-10-CM | POA: Diagnosis not present

## 2023-11-09 DIAGNOSIS — I480 Paroxysmal atrial fibrillation: Secondary | ICD-10-CM | POA: Diagnosis not present

## 2023-11-09 NOTE — Progress Notes (Addendum)
[name], [name] ([id number]) [id number] Page 1 of 7 Visit Report for 11/09/2023 Chief Complaint Document Details Patient Name: Date of Service: [name], GEO [facility] J. 11/09/2023 1:00 PM Medical Record Number: [id number] Patient Account Number: [id number] Date of Birth/Sex: Treating RN: June 18, 1946 (78 y.o. [name] Primary Care Provider: [name] Other Clinician: Betha Loa Referring Provider: Treating Provider/Extender: Hermine Messick Weeks in Treatment: 72 Information Obtained from: Patient Chief Complaint Left heel ulcer Electronic Signature(s) Signed: 11/09/2023 12:55:36 PM By: [name] Derry PA-C Entered By: [name] Derry on 11/09/2023 12:55:36 -------------------------------------------------------------------------------- Debridement Details Patient Name: Date of Service: [name], [name] 11/09/2023 1:00 PM Medical Record Number: [id number] Patient Account Number: [id number] Date of Birth/Sex: Treating RN: [birth year] (78 y.o. [name] Primary Care Provider: [name] Other Clinician: Betha Loa Referring Provider: Treating Provider/Extender: Hermine Messick Weeks in Treatment: 72 Debridement Performed for Assessment: Wound #17 Left Calcaneus Performed By: Physician [name] Derry, PA-C The following information was scribed by: Betha Loa The information was scribed for: [name] Derry Debridement Type: Debridement Level of Consciousness (Pre-procedure): Awake and Alert Pre-procedure Verification/Time Out Yes - 13:31 Taken: Start Time: 13:31 Percent of Wound Bed Debrided: 100% T Area Debrided (cm): otal 0.59 Tissue and other material debrided: Viable, Non-Viable, Callus, Slough, Subcutaneous, Biofilm, Slough Level: Skin/Subcutaneous Tissue Debridement Description: Excisional Instrument: Curette Bleeding: Minimum Hemostasis Achieved: Pressure Response to Treatment: Procedure was  tolerated well Level of Consciousness Arlie SolomonsTERROL, PUSKARICH ([id number]) [phone].pdf Page 2 of 7 Level of Consciousness (Post- Awake and Alert procedure): Post Debridement Measurements of Total Wound Length: (cm) 0.5 Stage: Category/Stage I Width: (cm) 1.5 Depth: (cm) 0.2 Volume: (cm) 0.118 Character of Wound/Ulcer Post Debridement: Stable Post Procedure Diagnosis Same as Pre-procedure Electronic Signature(s) Signed: 11/09/2023 5:12:31 PM By: Midge Aver MSN RN CNS WTA Signed: 11/09/2023 5:15:40 PM By: Betha Loa Signed: 11/09/2023 6:09:39 PM By: [name] Derry PA-C Entered By: Betha Loa on 11/09/2023 13:33:27 -------------------------------------------------------------------------------- HPI Details Patient Name: Date of Service: [name], [name] 11/09/2023 1:00 PM Medical Record Number: [id number] Patient Account Number: [id number] Date of Birth/Sex: Treating RN: 12/14/1945 (78 y.o. [name] Primary Care Provider: [name] Other Clinician: Betha Loa Referring Provider: Treating Provider/Extender: [name] in Treatment: 72 History of Present Illness HPI Description: Readmission: 06-19-2022 upon evaluation today patient appears to be doing well currently in regard to his heel ulcer all things considered. He unfortunately is having a bit of breakdown in general as far as the heel is concerned not nearly as bad as what we noted last time he was here in the clinic. The good news is I do think that this should hopefully heal much more rapidly than what we noted last time. His past medical history has not changed he is on Coumadin. 11-09-2023 upon evaluation today patient appears to be doing well currently in regard to his wound. He has been tolerating the dressing changes without complication. Fortunately there does not appear to be any signs of active infection locally or systemically which is great news.  No fevers, chills, nausea, vomiting, or diarrhea. Electronic Signature(s) Signed: 11/09/2023 1:54:56 PM By: [name] Derry PA-C Entered By: [name] Derry on 11/09/2023 13:54:55 -------------------------------------------------------------------------------- Physical Exam Details Patient Name: Date of Service: [name], [name] 11/09/2023 1:00 PM ALVIN, SARACCO ([id number]) [id number] Page 3 of 7 Medical Record Number: [id number] Patient Account Number: [id number] Date of Birth/Sex: Treating RN: September 07, 1946 (78 y.o. [name] Primary Care Provider:  [name] Other Clinician: Betha Loa Referring Provider: Treating Provider/Extender: Hermine Messick Weeks in Treatment: 51 Constitutional Well-nourished and well-hydrated in no acute distress. Respiratory normal breathing without difficulty. Psychiatric this patient is able to make decisions and demonstrates good insight into disease process. Alert and Oriented x 3. pleasant and cooperative. Notes Upon inspection patient's wound actually showed signs of good granulation epithelization at this point. Fortunately I do not see any evidence of worsening overall and I do believe that the patient is making excellent headway here towards closure. I am very pleased with what I am seeing. Electronic Signature(s) Signed: 11/09/2023 1:55:14 PM By: [name] Derry PA-C Entered By: [name] Derry on 11/09/2023 13:55:14 -------------------------------------------------------------------------------- Physician Orders Details Patient Name: Date of Service: [name], [name] 11/09/2023 1:00 PM Medical Record Number: [id number] Patient Account Number: [id number] Date of Birth/Sex: Treating RN: [birth year] (78 y.o. 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 Bathing/ [employer] wounds with antibacterial soap and water. May shower; gently cleanse wound with antibacterial soap, rinse and pat dry prior to dressing wounds Anesthetic (Use 'Patient Medications' Section for Anesthetic Order Entry) Lidocaine applied to wound bed - as needed Off-Loading Open toe surgical shoe - offloading heel surgical shoe left foot Wound Treatment Wound #17 - Calcaneus Wound Laterality: Left Cleanser: Normal Saline Discharge Instructions: Wash your hands with soap and water. Remove old dressing, discard into plastic bag and place into trash. Cleanse the DUNTE, BLINN ([id number]) [id number].pdf Page 4 of 7 wound with Normal Saline prior to applying a clean dressing using gauze sponges, not tissues or cotton balls. Do not scrub or use excessive force. Pat dry using gauze sponges, not tissue or cotton balls. Cleanser: Soap and Water Discharge Instructions: Gently cleanse wound with antibacterial soap, rinse and pat dry prior to dressing wounds Prim Dressing: Hydrofera Blue Ready Transfer Foam, 2.5x2.5 (in/in) ary Discharge Instructions: Apply Hydrofera Blue Ready to wound bed as directed Secondary Dressing: ABD Pad 5x9 (in/in) Discharge Instructions: Cover with ABD pad Compression Wrap: Urgo K2 Lite, two layer compression system, regular Electronic Signature(s) Signed: 11/09/2023 5:15:40 PM By: Betha Loa Signed: 11/09/2023 6:09:39 PM By: [name] Derry PA-C Entered By: Betha Loa on 11/09/2023  13:39:17 -------------------------------------------------------------------------------- Problem List Details Patient Name: Date of Service: [name], [name] 11/09/2023 1:00 PM Medical Record Number: [id number] Patient Account Number: [id number] Date of Birth/Sex: Treating RN: November 01, 1945 (78 y.o. [name] Primary Care Provider: [name] Other Clinician: Betha Loa Referring Provider: Treating Provider/Extender: Hermine Messick Weeks in Treatment: 72 Active Problems ICD-10 Encounter Code Description Active Date MDM Diagnosis E11.621 Type 2 diabetes mellitus with foot ulcer 06/19/2022 No Yes L97.422 Non-pressure chronic ulcer of left heel and midfoot with fat layer exposed 10/26/2023 No Yes E11.42 Type 2 diabetes mellitus with diabetic polyneuropathy 06/19/2022 No Yes Inactive Problems Resolved Problems ICD-10 Code Description Active Date Resolved Date L97.522 Non-pressure chronic ulcer of other part of left foot with fat layer exposed 06/19/2022 06/19/2022 Electronic Signature(s) Signed: 11/09/2023 12:55:33 PM By: Larina Bras,  [address] [place], [place] [phone] [id number])ZD By: [name] Derry PA-C [phone].pdf Page 5 of 7 Signed: 11/09/2023 12:55:33 Entered By: [name] Derry on 11/09/2023 12:55:33 -------------------------------------------------------------------------------- Progress Note Details Patient Name: Date of Service: [name], [name] 11/09/2023 1:00 PM Medical Record Number: [id number] Patient Account Number: [id number] Date of Birth/Sex: Treating RN: [birth year] (78 y.o. [name] Primary Care Provider: [name] Other Clinician: Betha Loa Referring Provider: Treating Provider/Extender: [name] in Treatment: 72 Subjective Chief Complaint Information obtained from Patient Left heel ulcer History of Present Illness (HPI) Readmission: 06-19-2022 upon evaluation today patient appears to  be doing well currently in regard to his heel ulcer all things considered. He unfortunately is having a bit of breakdown in general as far as the heel is concerned not nearly as bad as what we noted last time he was here in the clinic. The good news is I do think that this should hopefully heal much more rapidly than what we noted last time. His past medical history has not changed he is on Coumadin. 11-09-2023 upon evaluation today patient appears to be doing well currently in regard to his wound. He has been tolerating the dressing changes without complication. Fortunately there does not appear to be any signs of active infection locally or systemically which is great news. No fevers, chills, nausea, vomiting, or diarrhea. Objective Constitutional Well-nourished and well-hydrated in no acute distress. Vitals Time Taken: 1:03 PM, Height: 70 in, Weight: 265 lbs, BMI: 38, Temperature: 98.9 F, Pulse: 65 bpm, Respiratory Rate: 18 breaths/min, Blood Pressure: 161/65 mmHg. Respiratory normal breathing without difficulty. Psychiatric this patient is able to make decisions and demonstrates good insight into disease process. Alert and Oriented x 3. pleasant and cooperative. General Notes: Upon inspection patient's wound actually showed signs of good granulation epithelization at this point. Fortunately I do not see any evidence of worsening overall and I do believe that the patient is making excellent headway here towards closure. I am very pleased with what I am seeing. Integumentary (Hair, Skin) Wound #17 status is Open. Original cause of wound was Pressure Injury. The date acquired was: 10/11/2023. The wound has been in treatment 2 weeks. The wound is located on the Left Calcaneus. The wound measures 0.5cm length x 1.5cm width x 0.2cm depth; 0.589cm^2 area and 0.118cm^3 volume. There is Fat Layer (Subcutaneous Tissue) exposed. There is a small amount of serosanguineous drainage  noted. 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 There was a Excisional Skin/Subcutaneous Tissue Debridement with a total area of 0.59 sq cm performed by [name] Derry, PA-C. With the following instrument(s): Curette to remove Viable and Non-Viable tissue/material. Material removed includes Callus, Subcutaneous Tissue, Slough, and Biofilm. A time out was conducted at 13:31, prior to the start of the procedure. A Minimum amount of bleeding was controlled with Pressure. The procedure was tolerated well. Post Debridement Measurements: 0.5cm length x 1.5cm width x 0.2cm depth; 0.118cm^3 volume. Post debridement Stage noted as Category/Stage I. Character of Wound/Ulcer Post Debridement is stable. Post procedure Diagnosis Wound #17: Same as Pre-Procedure Plan Follow-up Appointments: Return Appointment in 1 week. Bathing/ Shower/ Hygiene: Wash wounds with antibacterial soap and water. May shower; gently cleanse wound  with antibacterial soap, rinse and pat dry prior to dressing wounds Anesthetic (Use 'Patient Medications' Section for Anesthetic Order Entry): Lidocaine applied to wound bed - as needed Off-Loading: Open toe surgical shoe - offloading heel surgical shoe left foot WOUND #17: - Calcaneus Wound Laterality: Left Cleanser: Normal Saline Discharge Instructions: Wash your hands with soap and water. Remove old dressing, discard into plastic bag and place into trash. Cleanse the wound with Normal Saline prior to applying a clean dressing using gauze sponges, not tissues or cotton balls. Do not scrub or use excessive force. Pat dry using gauze  sponges, not tissue or cotton balls. Cleanser: Soap and Water Discharge Instructions: Gently cleanse wound with antibacterial soap, rinse and pat dry prior to dressing wounds Prim Dressing: Hydrofera Blue Ready Transfer Foam, 2.5x2.5 (in/in) ary Discharge Instructions: Apply Hydrofera Blue Ready to wound bed as directed Secondary Dressing: ABD Pad 5x9 (in/in) Discharge Instructions: Cover with ABD pad Com pression Wrap: Urgo K2 Lite, two layer compression system, regular 1. I am going to recommend based on what we are seeing that we have the patient going continue to monitor for any evidence of infection or worsening. Based on what I am seeing I do believe that we are making good headway here towards closure I think the patient is tolerating the dressing changes without complication. 2. I would recommend that we continue with the [facility] followed by the ABD pad. 3. I am also going to recommend that we have the patient continue with the Urgo K2 light compression wrap which I think has been helpful up to this point. We will see patient back for reevaluation in 1 week here in the clinic. If anything worsens or changes patient will contact our office for additional recommendations. Electronic Signature(s) Signed: 11/09/2023 1:55:50 PM By: [name] Derry PA-C Entered By: [name] Derry on 11/09/2023 13:55:50 -------------------------------------------------------------------------------- SuperBill Details Patient Name: Date of Service: [name], [name] 11/09/2023 Medical Record Number: [id number] Patient Account Number: [id number] JEROME, BARRETT ([id number]) [phone].pdf Page 7 of 7 Date of Birth/Sex: Treating RN: 1946/01/08 (78 y.o. 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

## 2023-11-09 NOTE — Progress Notes (Addendum)
HENNY, FINO ([id number]) [id number] Page 1 of 8 Visit Report for 11/09/2023 Arrival Information Details Patient Name: Date of Service: [name], GEO [facility] J. 11/09/2023 1:00 PM Medical Record Number: [id number] Patient Account Number: [id number] Date of Birth/Sex: Treating RN: [birth year] (78 y.o. [name] Primary Care Locklan Canoy: [name] Other Clinician: Betha Loa Referring Azka Steger: Treating Brizza Nathanson/Extender: [name] in Treatment: 72 Visit Information History Since Last Visit All ordered tests and consults were completed: No Patient Arrived: [name] Added or deleted any medications: No Arrival Time: 12:56 Any new allergies or adverse reactions: No Transfer Assistance: None Had a fall or experienced change in No Patient Identification Verified: Yes activities of daily living that may affect Secondary Verification Process Completed: Yes risk of falls: Patient Requires Transmission-Based Precautions: No Signs or symptoms of abuse/neglect since last visito No Patient Has Alerts: Yes Hospitalized since last visit: No Patient Alerts: Patient on Blood Thinner Implantable device outside of the clinic excluding No Warfarin cellular tissue based products placed in the center Type II Diabetic since last visit: Has Dressing in Place as Prescribed: Yes Has Compression in Place as Prescribed: Yes Pain Present Now: No Electronic Signature(s) Signed: 11/09/2023 5:15:40 PM By: Betha Loa Entered By: Betha Loa on 11/09/2023 13:02:19 -------------------------------------------------------------------------------- Clinic Level of Care Assessment Details Patient Name: Date of Service: [name] J. 11/09/2023 1:00 PM Medical Record Number: [id number] Patient Account Number: [id number] Date of Birth/Sex: Treating RN: 06-May-1946 (78 y.o. [name] Primary Care Chameka Mcmullen: [name] Other Clinician:  Betha Loa Referring Lyndell Allaire: Treating Rosario Duey/Extender: [name] in Treatment: 72 Clinic Level of Care Assessment Items TOOL 1 Quantity Score []  - 0 Use when EandM and Procedure is performed on INITIAL visit ASSESSMENTS - Nursing Assessment / Reassessment []  - 0 General Physical Exam (combine w/ comprehensive assessment (listed just below) when performed on new pt. [address]GEFFREY, KNOBLOCK ([id number]) [id number] Page 2 of 8 []  - 0 Comprehensive Assessment (HX, ROS, Risk Assessments, Wounds Hx, etc.) ASSESSMENTS - Wound and Skin Assessment / Reassessment []  - 0 Dermatologic / Skin Assessment (not related to wound area) ASSESSMENTS - Ostomy and/or Continence Assessment and Care []  - 0 Incontinence Assessment and Management []  - 0 Ostomy Care Assessment and Management (repouching, etc.) PROCESS - Coordination of Care []  - 0 Simple Patient / Family Education for ongoing care []  - 0 Complex (extensive) Patient / Family Education for ongoing care []  - 0 Staff obtains Chiropractor, Records, T Results / Process Orders est []  - 0 Staff telephones HHA, Nursing Homes / Clarify orders / etc []  - 0 Routine Transfer to another Facility (non-emergent condition) []  - 0 Routine Hospital Admission (non-emergent condition) []  - 0 New Admissions / Manufacturing engineer / Ordering NPWT Apligraf, etc. , []  - 0 Emergency Hospital Admission (emergent condition) PROCESS - Special Needs []  - 0 Pediatric / Minor Patient Management []  - 0 Isolation Patient Management []  - 0 Hearing / Language / Visual special needs []  - 0 Assessment of Community assistance (transportation, D/C planning, etc.) []  - 0 Additional assistance / Altered mentation []  - 0 Support Surface(s) Assessment (bed, cushion, seat, etc.) INTERVENTIONS - Miscellaneous []  - 0 External ear exam []  - 0 Patient Transfer (multiple staff / Nurse, adult / Similar  devices) []  - 0 Simple Staple / Suture removal (25 or less) []  - 0 Complex Staple / Suture removal (26 or more) []  - 0 Hypo/Hyperglycemic Management (do not check if billed separately) []  -  0 Ankle / Brachial Index (ABI) - do not check if billed separately Has the patient been seen at the hospital within the last three years: Yes Total Score: 0 Level Of Care: ____ Electronic Signature(s) Signed: 11/09/2023 5:15:40 PM By: Betha Loa Entered By: Betha Loa on 11/09/2023 13:39:24 -------------------------------------------------------------------------------- Encounter Discharge Information Details Patient Name: Date of Service: [name], [name] 11/09/2023 1:00 PM Medical Record Number: [id number] Patient Account Number: [id number] Date of Birth/Sex: Treating RN: [birth year] (78 y.o. [name] Primary Care Erian Lariviere: [name] Other Clinician: Sohum, Klingerman ([id number]) [id number] Page 3 of 8 Referring Dunia Pringle: Treating Raimundo Corbit/Extender: [name] in Treatment: 72 Encounter Discharge Information Items Post Procedure Vitals Discharge Condition: Stable Temperature (F): 98.9 Ambulatory Status: Walker Pulse (bpm): 65 Discharge Destination: Home Respiratory Rate (breaths/min): 18 Transportation: Private Auto Blood Pressure (mmHg): 161/65 Accompanied By: self Schedule Follow-up Appointment: Yes Clinical Summary of Care: Electronic Signature(s) Signed: 11/09/2023 5:15:40 PM By: Betha Loa Entered By: Betha Loa on 11/09/2023 13:55:30 -------------------------------------------------------------------------------- Lower Extremity Assessment Details Patient Name: Date of Service: [name], [name] 11/09/2023 1:00 PM Medical Record Number: [id number] Patient Account Number: [id number] Date of Birth/Sex: Treating RN: [birth year] (78 y.o. [name] Primary Care Caelum Federici: [name] Other Clinician: Betha Loa Referring Rodriguez Aguinaldo: Treating Tou Hayner/Extender: Hermine Messick Weeks in Treatment: 72 Edema Assessment Assessed: [Left: Yes] [Right: No] Edema: [Left: Ye] [Right: s] Calf Left: Right: Point of Measurement: 37 cm From Medial Instep 38.5 cm Ankle Left: Right: Point of Measurement: 10 cm From Medial Instep 22 cm Knee To Floor Left: Right: From Medial Instep 53 cm Vascular Assessment Pulses: Dorsalis Pedis Palpable: [Left:Yes] Extremity colors, hair growth, and conditions: Extremity Color: [Left:Normal] Hair Growth on Extremity: [Left:Yes] Temperature of Extremity: [Left:Warm < 3 seconds] Toe Nail Assessment Left: Right: Thick: Yes Discolored: No Deformed: No Improper Length and Hygiene: No KENLEY, MORGANO ([id number]) [phone].pdf Page 4 of 8 Electronic Signature(s) Signed: 11/09/2023 5:12:31 PM By: Midge Aver MSN RN CNS WTA Signed: 11/09/2023 5:15:40 PM By: Betha Loa Entered By: Betha Loa on 11/09/2023 13:18:39 -------------------------------------------------------------------------------- Multi Wound Chart Details Patient Name: Date of Service: [name], [name] 11/09/2023 1:00 PM Medical Record Number: [id number] Patient Account Number: [id number] Date of Birth/Sex: Treating RN: August 01, 1946 (78 y.o. [name] Primary Care Lanson Randle: [name] Other Clinician: Betha Loa Referring Donald Jacque: Treating Adelee Hannula/Extender: Hermine Messick Weeks in Treatment: 72 Vital Signs Height(in): 70 Pulse(bpm): 65 Weight(lbs): 265 Blood Pressure(mmHg): 161/65 Body Mass Index(BMI): 38 Temperature(F): 98.9 Respiratory Rate(breaths/min): 18 [17:Photos:] [N/A:N/A] Left Calcaneus N/A N/A Wound Location: Pressure Injury N/A N/A Wounding Event: Pressure Ulcer N/A N/A Primary Etiology: Cataracts, Arrhythmia, Coronary N/A N/A Comorbid History: Artery Disease,  Hypertension, Type II Diabetes, Osteoarthritis, Neuropathy 10/11/2023 N/A N/A Date Acquired: 2 N/A N/A Weeks of Treatment: Open N/A N/A Wound Status: No N/A N/A Wound Recurrence: 0.5x1.5x0.2 N/A N/A Measurements L x W x D (cm) 0.589 N/A N/A A (cm) : rea 0.118 N/A N/A Volume (cm) : 16.70% N/A N/A % Reduction in A rea: -66.20% N/A N/A % Reduction in Volume: Category/Stage I N/A N/A Classification: Small N/A N/A Exudate A mount: Serosanguineous N/A N/A Exudate Type: red, brown N/A N/A Exudate Color: Fat Layer (Subcutaneous Tissue): Yes N/A N/A Exposed Structures: Fascia: No Tendon: No Muscle: No Joint: No Bone: No Treatment Notes TANOR, MARTINA ([id number]) [phone].pdf Page 5 of 8 Electronic Signature(s) Signed: 11/09/2023 5:15:40 PM By: Betha Loa Entered By:  Betha Loa on 11/09/2023 13:18:45 -------------------------------------------------------------------------------- Multi-Disciplinary Care Plan Details Patient Name: Date of Service: [name], GEO [facility] J. 11/09/2023 1:00 PM Medical Record Number: [id number] Patient Account Number: [id number] Date of Birth/Sex: Treating RN: 20-[name]-1946 (78 y.o. [name] Primary Care Mellony Danziger: [name] Other Clinician: Betha Loa Referring Nida Manfredi: Treating Royalty Fakhouri/Extender: Hermine Messick Weeks in Treatment: 53 Active Inactive Electronic Signature(s) Signed: 11/09/2023 5:12:31 PM By: Midge Aver MSN RN CNS WTA Signed: 11/09/2023 5:15:40 PM By: Betha Loa Entered By: Betha Loa on 11/09/2023 13:39:35 -------------------------------------------------------------------------------- Pain Assessment Details Patient Name: Date of Service: [name], [name] 11/09/2023 1:00 PM Medical Record Number: [id number] Patient Account Number: [id number] Date of Birth/Sex: Treating RN: June 12, 1946 (78 y.o. [name] Primary Care Sahily Biddle: [name] Other Clinician: Betha Loa Referring Minetta Krisher: Treating Yarelly Kuba/Extender: Hermine Messick Weeks in Treatment: 72 Active Problems Location of Pain Severity and Description of Pain Patient Has Paino No Site Locations HELIODORO, GORIS ([id number]) [phone].pdf Page 6 of 8 Pain Management and Medication Current Pain Management: Electronic Signature(s) Signed: 11/09/2023 5:12:31 PM By: Midge Aver MSN RN CNS WTA Signed: 11/09/2023 5:15:40 PM By: Betha Loa Entered By: Betha Loa on 11/09/2023 13:06:03 -------------------------------------------------------------------------------- Patient/Caregiver Education Details Patient Name: Date of Service: [name], GEO Cherrie Gauze 1/21/2025andnbsp1:00 PM Medical Record Number: [id number] Patient Account Number: [id number] Date of Birth/Gender: Treating RN: 08/19/1946 (79 y.o. [name] Primary Care Physician: [name] Other Clinician: Betha Loa Referring Physician: Treating Physician/Extender: [name] in Treatment: 72 Education Assessment Education Provided To: Patient Education Topics Provided Wound/Skin Impairment: Handouts: Other: continue wound care as directed Methods: Explain/Verbal Responses: State content correctly Electronic Signature(s) Signed: 11/09/2023 5:15:40 PM By: Betha Loa Entered By: Betha Loa on 11/09/2023 13:54:15 Barrie Folk ([id number]) [id number] Page 7 of 8 -------------------------------------------------------------------------------- Wound Assessment Details Patient Name: Date of Service: [name], [name] 11/09/2023 1:00 PM Medical Record Number: [id number] Patient Account Number: [id number] Date of Birth/Sex: Treating RN: Aug 24, 1946 (78 y.o. 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 Wound Open Wounding Event: Pressure Injury Status: Date Acquired: 10/11/2023 Comorbid Cataracts, Arrhythmia, Coronary Artery Disease, Hypertension, Weeks Of Treatment: 2 History: Type II Diabetes, Osteoarthritis, Neuropathy Clustered Wound: No Photos Wound Measurements Length: (cm) 0.5 Width: (cm) 1.5 Depth: (cm) 0.2 Area: (cm) 0.589 Volume: (cm) 0.118 % Reduction in Area: 16.7% % Reduction in Volume: -66.2% Wound Description Classification: Category/Stage I Exudate Amount: Small Exudate Type: Serosanguineous Exudate Color: red, brown Foul Odor After Cleansing: No Slough/Fibrino Yes Wound Bed Exposed Structure Fascia Exposed: No Fat Layer (Subcutaneous Tissue) Exposed: Yes Tendon Exposed: No Muscle Exposed: No Joint Exposed: No Bone Exposed: No Treatment Notes Wound #17 (Calcaneus) Wound Laterality: Left Cleanser Normal Saline Discharge Instruction: Wash your hands with soap and water. Remove old dressing, discard into plastic bag and place into trash. Cleanse the wound with Normal Saline prior to applying a clean dressing using gauze sponges, not tissues or cotton balls. Do not scrub or use excessive force. Pat dry using gauze sponges, not tissue or cotton balls. HOYT, VARANO ([id number]) [id number] Page 8 of 8 Soap and Water Discharge Instruction: Gently cleanse wound with antibacterial soap, rinse and pat dry prior to dressing wounds Peri-Wound Care Topical Primary Dressing Hydrofera Blue Ready Transfer Foam, 2.5x2.5 (in/in) Discharge Instruction:  Apply Hydrofera Blue Ready to wound bed as directed Secondary Dressing ABD Pad 5x9 (in/in) Discharge Instruction: Cover with ABD pad Secured With Compression Wrap Urgo K2 Lite, two layer compression system,  regular Compression Stockings Add-Ons Electronic Signature(s) Signed: 11/09/2023 5:12:31 PM By: Midge Aver MSN RN CNS WTA Signed: 11/09/2023 5:15:40 PM By: Betha Loa Entered By: Betha Loa on 11/09/2023 13:16:13 -------------------------------------------------------------------------------- Vitals Details Patient Name: Date of Service: [name], [name] 11/09/2023 1:00 PM Medical Record Number: [id number] Patient Account Number: [id number] Date of Birth/Sex: Treating RN: March 19, 1946 (78 y.o. [name] Primary Care Kaylani Fromme: [name] Other Clinician: Betha Loa Referring Jaylen Claude: Treating Douglass Dunshee/Extender: Hermine Messick Weeks in Treatment: 72 Vital Signs Time Taken: 13:03 Temperature (F): 98.9 Height (in): 70 Pulse (bpm): 65 Weight (lbs): 265 Respiratory Rate (breaths/min): 18 Body Mass Index (BMI): 38 Blood Pressure (mmHg): 161/65 Reference Range: 80 - 120 mg / dl Electronic Signature(s) Signed: 11/09/2023 5:15:40 PM By: Betha Loa Entered By: Betha Loa on 11/09/2023 13:05:57

## 2023-11-10 DIAGNOSIS — E0590 Thyrotoxicosis, unspecified without thyrotoxic crisis or storm: Secondary | ICD-10-CM | POA: Diagnosis not present

## 2023-11-10 DIAGNOSIS — E119 Type 2 diabetes mellitus without complications: Secondary | ICD-10-CM | POA: Diagnosis not present

## 2023-11-10 DIAGNOSIS — I6523 Occlusion and stenosis of bilateral carotid arteries: Secondary | ICD-10-CM | POA: Diagnosis not present

## 2023-11-10 DIAGNOSIS — J449 Chronic obstructive pulmonary disease, unspecified: Secondary | ICD-10-CM | POA: Diagnosis not present

## 2023-11-10 DIAGNOSIS — I480 Paroxysmal atrial fibrillation: Secondary | ICD-10-CM | POA: Diagnosis not present

## 2023-11-10 DIAGNOSIS — I10 Essential (primary) hypertension: Secondary | ICD-10-CM | POA: Diagnosis not present

## 2023-11-10 DIAGNOSIS — I2510 Atherosclerotic heart disease of native coronary artery without angina pectoris: Secondary | ICD-10-CM | POA: Diagnosis not present

## 2023-11-10 DIAGNOSIS — R9431 Abnormal electrocardiogram [ECG] [EKG]: Secondary | ICD-10-CM | POA: Diagnosis not present

## 2023-11-10 DIAGNOSIS — I440 Atrioventricular block, first degree: Secondary | ICD-10-CM | POA: Diagnosis not present

## 2023-11-12 DIAGNOSIS — E538 Deficiency of other specified B group vitamins: Secondary | ICD-10-CM | POA: Diagnosis not present

## 2023-11-16 ENCOUNTER — Encounter: Payer: PPO | Admitting: Physician Assistant

## 2023-11-16 DIAGNOSIS — I10 Essential (primary) hypertension: Secondary | ICD-10-CM | POA: Diagnosis not present

## 2023-11-16 DIAGNOSIS — I700 Atherosclerosis of aorta: Secondary | ICD-10-CM | POA: Diagnosis not present

## 2023-11-16 DIAGNOSIS — Z952 Presence of prosthetic heart valve: Secondary | ICD-10-CM | POA: Diagnosis not present

## 2023-11-16 DIAGNOSIS — Z8679 Personal history of other diseases of the circulatory system: Secondary | ICD-10-CM | POA: Diagnosis not present

## 2023-11-16 DIAGNOSIS — I480 Paroxysmal atrial fibrillation: Secondary | ICD-10-CM | POA: Diagnosis not present

## 2023-11-16 DIAGNOSIS — E11621 Type 2 diabetes mellitus with foot ulcer: Secondary | ICD-10-CM | POA: Diagnosis not present

## 2023-11-16 DIAGNOSIS — I6523 Occlusion and stenosis of bilateral carotid arteries: Secondary | ICD-10-CM | POA: Diagnosis not present

## 2023-11-16 DIAGNOSIS — E782 Mixed hyperlipidemia: Secondary | ICD-10-CM | POA: Diagnosis not present

## 2023-11-16 DIAGNOSIS — L89621 Pressure ulcer of left heel, stage 1: Secondary | ICD-10-CM | POA: Diagnosis not present

## 2023-11-16 DIAGNOSIS — I2510 Atherosclerotic heart disease of native coronary artery without angina pectoris: Secondary | ICD-10-CM | POA: Diagnosis not present

## 2023-11-16 DIAGNOSIS — I272 Pulmonary hypertension, unspecified: Secondary | ICD-10-CM | POA: Diagnosis not present

## 2023-11-16 DIAGNOSIS — Z9889 Other specified postprocedural states: Secondary | ICD-10-CM | POA: Diagnosis not present

## 2023-11-16 DIAGNOSIS — I350 Nonrheumatic aortic (valve) stenosis: Secondary | ICD-10-CM | POA: Diagnosis not present

## 2023-11-19 ENCOUNTER — Ambulatory Visit: Payer: Medicare HMO | Admitting: Neurology

## 2023-11-23 ENCOUNTER — Other Ambulatory Visit: Payer: Self-pay

## 2023-11-23 ENCOUNTER — Encounter: Payer: PPO | Attending: Physician Assistant | Admitting: Physician Assistant

## 2023-11-23 DIAGNOSIS — E1142 Type 2 diabetes mellitus with diabetic polyneuropathy: Secondary | ICD-10-CM | POA: Insufficient documentation

## 2023-11-23 DIAGNOSIS — L97422 Non-pressure chronic ulcer of left heel and midfoot with fat layer exposed: Secondary | ICD-10-CM | POA: Insufficient documentation

## 2023-11-23 DIAGNOSIS — L89621 Pressure ulcer of left heel, stage 1: Secondary | ICD-10-CM | POA: Diagnosis not present

## 2023-11-23 DIAGNOSIS — E11621 Type 2 diabetes mellitus with foot ulcer: Secondary | ICD-10-CM | POA: Diagnosis not present

## 2023-11-23 MED ORDER — PREGABALIN 150 MG PO CAPS: ORAL_CAPSULE | ORAL | 5 refills | Status: DC

## 2023-11-23 NOTE — Telephone Encounter (Signed)
 Last seen 02/16/23, next appt not scheduled Dispenses    Dispensed Days Supply Quantity Provider Pharmacy  PREGABALIN  150 MG CAPSULE 10/21/2023 30 120 each Onita Duos, MD [facility] [phone] - M...  PREGABALIN  150 MG CAPSULE 09/04/2023 30 120 each Camara, Amadou, MD [facility] [phone] - M...  PREGABALIN  150 MG CAPSULE 08/02/2023 30 120 each Camara, Amadou, MD [facility] [phone] - M...  PREGABALIN  150 MG CAPSULE 06/24/2023 30 120 each Camara, Amadou, MD [facility] [phone] - M...  PREGABALIN  150 MG CAPSULE 04/29/2023 30 120 each Camara, Amadou, MD [facility] [phone] - M...  PREGABALIN  150 MG CAPSULE 03/23/2023 30 120 each Camara, Amadou, MD [facility] [phone] - M.SABRASABRA

## 2023-11-29 DIAGNOSIS — D0422 Carcinoma in situ of skin of left ear and external auricular canal: Secondary | ICD-10-CM | POA: Diagnosis not present

## 2023-11-29 DIAGNOSIS — C44229 Squamous cell carcinoma of skin of left ear and external auricular canal: Secondary | ICD-10-CM | POA: Diagnosis not present

## 2023-11-30 ENCOUNTER — Encounter: Payer: PPO | Admitting: Physician Assistant

## 2023-11-30 DIAGNOSIS — E11621 Type 2 diabetes mellitus with foot ulcer: Secondary | ICD-10-CM | POA: Diagnosis not present

## 2023-11-30 DIAGNOSIS — M47896 Other spondylosis, lumbar region: Secondary | ICD-10-CM | POA: Diagnosis not present

## 2023-11-30 DIAGNOSIS — Z952 Presence of prosthetic heart valve: Secondary | ICD-10-CM | POA: Diagnosis not present

## 2023-11-30 DIAGNOSIS — L89621 Pressure ulcer of left heel, stage 1: Secondary | ICD-10-CM | POA: Diagnosis not present

## 2023-11-30 DIAGNOSIS — Z7901 Long term (current) use of anticoagulants: Secondary | ICD-10-CM | POA: Diagnosis not present

## 2023-11-30 DIAGNOSIS — G894 Chronic pain syndrome: Secondary | ICD-10-CM | POA: Diagnosis not present

## 2023-11-30 HISTORY — PX: SKIN CANCER EXCISION: SHX779

## 2023-12-07 ENCOUNTER — Encounter: Payer: PPO | Admitting: Physician Assistant

## 2023-12-07 DIAGNOSIS — E11621 Type 2 diabetes mellitus with foot ulcer: Secondary | ICD-10-CM | POA: Diagnosis not present

## 2023-12-07 DIAGNOSIS — L89621 Pressure ulcer of left heel, stage 1: Secondary | ICD-10-CM | POA: Diagnosis not present

## 2023-12-13 DIAGNOSIS — E538 Deficiency of other specified B group vitamins: Secondary | ICD-10-CM | POA: Diagnosis not present

## 2023-12-14 ENCOUNTER — Encounter: Payer: PPO | Admitting: Physician Assistant

## 2023-12-14 DIAGNOSIS — L570 Actinic keratosis: Secondary | ICD-10-CM | POA: Diagnosis not present

## 2023-12-14 DIAGNOSIS — E11621 Type 2 diabetes mellitus with foot ulcer: Secondary | ICD-10-CM | POA: Diagnosis not present

## 2023-12-14 DIAGNOSIS — L89621 Pressure ulcer of left heel, stage 1: Secondary | ICD-10-CM | POA: Diagnosis not present

## 2023-12-14 DIAGNOSIS — D492 Neoplasm of unspecified behavior of bone, soft tissue, and skin: Secondary | ICD-10-CM | POA: Diagnosis not present

## 2023-12-15 DIAGNOSIS — Z79899 Other long term (current) drug therapy: Secondary | ICD-10-CM | POA: Diagnosis not present

## 2023-12-15 DIAGNOSIS — E118 Type 2 diabetes mellitus with unspecified complications: Secondary | ICD-10-CM | POA: Diagnosis not present

## 2023-12-15 DIAGNOSIS — E0590 Thyrotoxicosis, unspecified without thyrotoxic crisis or storm: Secondary | ICD-10-CM | POA: Diagnosis not present

## 2023-12-21 ENCOUNTER — Encounter: Payer: Self-pay | Attending: Physician Assistant | Admitting: Physician Assistant

## 2023-12-21 DIAGNOSIS — L97422 Non-pressure chronic ulcer of left heel and midfoot with fat layer exposed: Secondary | ICD-10-CM | POA: Insufficient documentation

## 2023-12-21 DIAGNOSIS — E1142 Type 2 diabetes mellitus with diabetic polyneuropathy: Secondary | ICD-10-CM | POA: Insufficient documentation

## 2023-12-21 DIAGNOSIS — E11621 Type 2 diabetes mellitus with foot ulcer: Secondary | ICD-10-CM | POA: Diagnosis not present

## 2023-12-22 ENCOUNTER — Encounter: Payer: Self-pay | Admitting: Neurology

## 2023-12-22 ENCOUNTER — Ambulatory Visit: Payer: PPO | Admitting: Neurology

## 2023-12-22 VITALS — BP 113/59 | HR 71 | Ht 70.0 in | Wt 266.5 lb

## 2023-12-22 DIAGNOSIS — Z0000 Encounter for general adult medical examination without abnormal findings: Secondary | ICD-10-CM | POA: Diagnosis not present

## 2023-12-22 DIAGNOSIS — Z7984 Long term (current) use of oral hypoglycemic drugs: Secondary | ICD-10-CM | POA: Diagnosis not present

## 2023-12-22 DIAGNOSIS — I480 Paroxysmal atrial fibrillation: Secondary | ICD-10-CM | POA: Diagnosis not present

## 2023-12-22 DIAGNOSIS — E1169 Type 2 diabetes mellitus with other specified complication: Secondary | ICD-10-CM | POA: Diagnosis not present

## 2023-12-22 DIAGNOSIS — E782 Mixed hyperlipidemia: Secondary | ICD-10-CM | POA: Diagnosis not present

## 2023-12-22 DIAGNOSIS — I10 Essential (primary) hypertension: Secondary | ICD-10-CM | POA: Diagnosis not present

## 2023-12-22 DIAGNOSIS — R269 Unspecified abnormalities of gait and mobility: Secondary | ICD-10-CM | POA: Diagnosis not present

## 2023-12-22 DIAGNOSIS — Z6841 Body Mass Index (BMI) 40.0 and over, adult: Secondary | ICD-10-CM | POA: Diagnosis not present

## 2023-12-22 DIAGNOSIS — E1149 Type 2 diabetes mellitus with other diabetic neurological complication: Secondary | ICD-10-CM

## 2023-12-22 DIAGNOSIS — E0590 Thyrotoxicosis, unspecified without thyrotoxic crisis or storm: Secondary | ICD-10-CM | POA: Diagnosis not present

## 2023-12-22 DIAGNOSIS — G629 Polyneuropathy, unspecified: Secondary | ICD-10-CM | POA: Diagnosis not present

## 2023-12-22 DIAGNOSIS — Z79899 Other long term (current) drug therapy: Secondary | ICD-10-CM | POA: Diagnosis not present

## 2023-12-22 DIAGNOSIS — E118 Type 2 diabetes mellitus with unspecified complications: Secondary | ICD-10-CM | POA: Diagnosis not present

## 2023-12-22 MED ORDER — PREGABALIN 225 MG PO CAPS: ORAL_CAPSULE | ORAL | 3 refills | Status: DC

## 2023-12-22 MED ORDER — DULOXETINE HCL 60 MG PO CPEP: 60.0000 mg | ORAL_CAPSULE | Freq: Every evening | ORAL | 3 refills | Status: AC

## 2023-12-22 NOTE — Progress Notes (Signed)
 [facility]  PATIENT: [name] DOB: [birth year]  REQUESTING CLINICIAN: Marguarite Arbour, MD HISTORY FROM: Patient and spouse  REASON FOR VISIT: Dizziness, neuropathy    HISTORICAL  CHIEF COMPLAINT:  Chief Complaint  Patient presents with   Room 12    Pt is here with his Wife. Pt states that he has had his Polyneuropathy for 13 years. Pt states that his balance is off. Pt states that he has burning sensations all of the time in his feet.  Pt states that the insurance company won't fill his medication due to him taking his lyrica 3 times per day, pt is wanting to increase his medication.     INTERVAL HISTORY 12/22/2023:  Patient presents today for follow-up, he is accompanied by wife, last visit was a year ago, at that time we increased his duloxetine and increase his pregabalin but insurance did not approve the increase in pregabalin, currently he is taking pregabalin 450 mg at night but still have symptom of peripheral neuropathy.  Again he is not interested in spinal cord stimulator He tells me that now he has to rely on a cane to avoid falls.  Would like increasing his pregabalin.   INTERVAL HISTORY 02/16/2023:  Patient presents today for follow-up, he is accompanied by his wife.  Reports that his neuropathic pain is still present and is very painful.  He is on duloxetine 30 mg in the morning and also pregabalin 150 in the morning and 300 at night.  He reports that this is not enough to control his pain.  He reports in the past, 12 years ago he did have spinal cord stimulator for neuropathic pain but did not like the results, he is not interested in another evaluation for spinal cord stimulation for neuropathic pain.  He is willing to make some changes to his medication but no surgical procedure.   INTERVAL HISTORY 08/17/22:  Patient presents today for follow-up, at last visit in June we had discontinued his Trileptal and gabapentin and start him on  pregabalin.  Since then he has been doing well.  Reports that 150 twice a day of pregabalin has been helpful but at night he still experience neuropathic pain. He is still on duloxetine.  He also reports in the past, he was trialed for a spinal cord stimulator for neuropathy but did not tolerated therefore did not move forward with the procedure.  Still uses a cane with ambulation, denies any falls.   INTERVAL HISTORY 04/13/22 [name] presented today for follow-up, at last visit I started her on Trileptal for the peripheral neuropathy since he was already on gabapentin and duloxetine.  He states that his pain is not improving and actually worse in the evening when sitting down and relaxing.  He would like to try something else for the pain because this will bothering him, again described as burning, stabbing, numbing pain.  Currently he is using a walker, denies any recent falls.     HISTORY OF PRESENT ILLNESS:  This is a 78 year old gentleman with past medical history including atrial fibrillation, peripheral neuropathy, history of stroke, hypertension, hyperlipidemia, and diabetes mellitus type 2 who is presenting with complaint of dizziness.  Patient reports 4 years ago he was admitted, diagnosed with a stroke and since then has been having dizziness.  He described the dizziness as feeling lightheaded upon standing and starting to walk.  He denies any room spinning sensation and denies having the dizziness while sitting down or laying down.  It only occurs upon standing and walking.  He feels like he is going to fall.  Currently he uses a cane.  He has not completed physical therapy lately.   On top of that, he is complaining of peripheral neuropathy, diagnosed for more than 15 years.  He describes the pain as painful burning sensation, difficulty walking, cannot feel the ground.  He did have work-up in the past including EMG, nerve conduction conduction study and ultrasound of the leg  Diagnosis was  neuropathy and he had tried multiple medication including TCAs, antidepressant, antiseizure medication including Neurontin and pregabalin.  Currently he is on Neurontin 600 mg twice daily and duloxetine 60 mg in the evening.    OTHER MEDICAL CONDITIONS: Atrial fibrillation, peripheral neuropathy, diabetes mellitus type 2, hypertension, hyperlipidemia, CAD, history of CVA   REVIEW OF SYSTEMS: Full 14 system review of systems performed and negative with exception of: as noted in the HPI  ALLERGIES: Allergies  Allergen Reactions   Tetracycline Hives    Other Reaction(s): Not available   Tetracyclines & Related Hives    HOME MEDICATIONS: Outpatient Medications Prior to Visit  Medication Sig Dispense Refill   cyanocobalamin (,VITAMIN B-12,) 1000 MCG/ML injection Inject 1,000 mcg into the muscle every 30 (thirty) days.     cyclobenzaprine (FLEXERIL) 10 MG tablet Take 10 mg by mouth daily as needed for muscle spasms.     ferrous sulfate 325 (65 FE) MG tablet Take 1 tablet (325 mg total) by mouth 2 (two) times daily with a meal. (Patient taking differently: Take 325 mg by mouth daily.) 60 tablet 3   loratadine (CLARITIN) 10 MG tablet Take 10 mg by mouth daily.     metFORMIN (GLUCOPHAGE) 1000 MG tablet Take 1,000 mg by mouth 2 (two) times daily. With meals.     methadone (DOLOPHINE) 5 MG tablet Take 5 mg by mouth 2 (two) times daily.      methimazole (TAPAZOLE) 10 MG tablet Take 20 mg by mouth daily.     Multiple Vitamin (MULTI-VITAMINS) TABS Take 1 tablet by mouth daily.     pravastatin (PRAVACHOL) 40 MG tablet Take 40 mg by mouth every evening.      torsemide (DEMADEX) 20 MG tablet Take by mouth daily.     warfarin (COUMADIN) 2.5 MG tablet Take 2.5 mg by mouth. Take on Monday and Friday only     warfarin (COUMADIN) 5 MG tablet Take 5 mg by mouth daily. Take on Tuesday, Wednesday, Thursday, Saturday, and Sunday     DULoxetine (CYMBALTA) 60 MG capsule Take 30 mg by mouth every evening.      pregabalin (LYRICA) 150 MG capsule Take 1 capsule (150 mg total) by mouth daily AND 3 capsules (450 mg total) every evening. 120 capsule 5   amiodarone (PACERONE) 200 MG tablet Take 1 tablet (200 mg total) by mouth 2 (two) times daily. (Patient not taking: Reported on 12/22/2023) 60 tablet 0   aspirin EC 81 MG tablet Take 81 mg by mouth daily. (Patient not taking: Reported on 12/22/2023)     glipiZIDE (GLUCOTROL XL) 5 MG 24 hr tablet Take 5 mg by mouth daily. (Patient not taking: Reported on 12/22/2023)     pantoprazole (PROTONIX) 40 MG tablet Take 40 mg by mouth daily. (Patient not taking: Reported on 12/22/2023)     No facility-administered medications prior to visit.    PAST MEDICAL HISTORY: Past Medical History:  Diagnosis Date   Anemia    CHF (congestive heart failure) (HCC)  Chronic pain    Coronary artery disease    DDD (degenerative disc disease), hip    Diabetes mellitus without complication (HCC)    DJD (degenerative joint disease)    Dysrhythmia    GERD (gastroesophageal reflux disease)    History of hip replacement    Hyperlipemia    Hypertension    Insomnia    Neuropathy    Paroxysmal A-fib (HCC)    Peripheral neuropathy    Stroke [facility])    Thoracic aortic atherosclerosis (HCC)     PAST SURGICAL HISTORY: Past Surgical History:  Procedure Laterality Date   BACK SURGERY     CARDIAC SURGERY     CARDIAC VALVE REPLACEMENT     CARDIOVERSION N/A 05/04/2017   Procedure: CARDIOVERSION;  Surgeon: Lamar Blinks, MD;  Location: [facility] ORS;  Service: Cardiovascular;  Laterality: N/A;   CARDIOVERSION N/A 09/04/2021   Procedure: CARDIOVERSION;  Surgeon: Lamar Blinks, MD;  Location: [facility] ORS;  Service: Cardiovascular;  Laterality: N/A;   carpel tunnel release     CATARACT EXTRACTION W/ INTRAOCULAR LENS IMPLANT Bilateral    COLONOSCOPY     COLONOSCOPY WITH ESOPHAGOGASTRODUODENOSCOPY (EGD)     COLONOSCOPY WITH PROPOFOL N/A 05/16/2018   Procedure: COLONOSCOPY WITH PROPOFOL;   Surgeon: Scot [name], MD;  Location: [facility] ENDOSCOPY;  Service: Endoscopy;  Laterality: N/A;   COLONOSCOPY WITH PROPOFOL N/A 08/06/2021   Procedure: COLONOSCOPY WITH PROPOFOL;  Surgeon: Toledo, Boykin Nearing, MD;  Location: [facility] ENDOSCOPY;  Service: Gastroenterology;  Laterality: N/A;   ESOPHAGOGASTRODUODENOSCOPY (EGD) WITH PROPOFOL N/A 05/16/2018   Procedure: ESOPHAGOGASTRODUODENOSCOPY (EGD) WITH PROPOFOL;  Surgeon: Scot [name], MD;  Location: [facility] ENDOSCOPY;  Service: Endoscopy;  Laterality: N/A;   JOINT REPLACEMENT     SKIN CANCER EXCISION Left 11/30/2023   TEE WITHOUT CARDIOVERSION N/A 10/08/2016   Procedure: TRANSESOPHAGEAL ECHOCARDIOGRAM (TEE);  Surgeon: Dalia Heading, MD;  Location: [facility] ORS;  Service: Cardiovascular;  Laterality: N/A;   TEE WITHOUT CARDIOVERSION N/A 11/23/2016   Procedure: TRANSESOPHAGEAL ECHOCARDIOGRAM (TEE);  Surgeon: Marcina Millard, MD;  Location: [facility] ORS;  Service: Cardiovascular;  Laterality: N/A;   TONSILLECTOMY     VSD REPAIR      FAMILY HISTORY: Family History  Problem Relation Age of Onset   Stroke Mother    Heart attack Mother    Heart attack Father     SOCIAL HISTORY: Social History   Socioeconomic History   Marital status: Married    Spouse name: Not on file   Number of children: 2   Years of education: Not on file   Highest education level: Not on file  Occupational History   Not on file  Tobacco Use   Smoking status: Former    Current packs/day: 0.00    Types: Cigarettes    Quit date: 07/09/1996    Years since quitting: 27.4   Smokeless tobacco: Never  Vaping Use   Vaping status: Never Used  Substance and Sexual Activity   Alcohol use: Yes    Alcohol/week: 2.0 standard drinks of alcohol    Types: 1 Cans of beer, 1 Standard drinks or equivalent per week    Comment: occasional   Drug use: No   Sexual activity: Yes  Other Topics Concern   Not on file  Social History Narrative   Not on file   Social Drivers of  Health   Financial Resource Strain: Not on file  Food Insecurity: Not on file  Transportation Needs: Not on file  Physical Activity: Not  on file  Stress: Not on file  Social Connections: Unknown (02/27/2022)   Received from [facility], [facility]   Social Network    Social Network: Not on file  Intimate Partner Violence: Unknown (01/20/2022)   Received from [facility], [facility]   HITS    Physically Hurt: Not on file    Insult or Talk Down To: Not on file    Threaten Physical Harm: Not on file    Scream or Curse: Not on file    PHYSICAL EXAM  GENERAL EXAM/CONSTITUTIONAL: Vitals:  Vitals:   12/22/23 1513  BP: (!) 113/59  Pulse: 71  Weight: 266 lb 8 oz (120.9 kg)  Height: 5\' 10"  (1.778 m)   Body mass index is 38.24 kg/m. Wt Readings from Last 3 Encounters:  12/22/23 266 lb 8 oz (120.9 kg)  02/16/23 271 lb 8 oz (123.2 kg)  08/17/22 277 lb (125.6 kg)   Patient is in no distress; well developed, nourished and groomed; neck is supple  MUSCULOSKELETAL: Gait, strength, tone, movements noted in Neurologic exam below  NEUROLOGIC: MENTAL STATUS:      No data to display         awake, alert, oriented to person, place and time recent and remote memory intact normal attention and concentration language fluent, comprehension intact, naming intact fund of knowledge appropriate  CRANIAL NERVE:  2nd, 3rd, 4th, 6th - pupils equal and reactive to light, visual fields full to confrontation, extraocular muscles intact, no nystagmus 5th - facial sensation symmetric 7th - facial strength symmetric 8th - hearing intact 9th - palate elevates symmetrically, uvula midline 11th - shoulder shrug symmetric 12th - tongue protrusion midline  MOTOR:  normal bulk and tone, full strength in the BUE, BLE  SENSORY:  Absent sensation to light touch, pinprick and vibration in the bilateral lower extremities up to knees and decrease sensation to  tips of fingers. There is  absent proprioception. Absent hair in the BLEs.   COORDINATION:  finger-nose-finger, fine finger movements normal   GAIT/STATION:  Wide base, uses a cane, very antalgic, wobbly gait    DIAGNOSTIC DATA (LABS, IMAGING, TESTING) - I reviewed patient records, labs, notes, testing and imaging myself where available.  Lab Results  Component Value Date   WBC 8.3 09/08/2021   HGB 10.3 (L) 09/08/2021   HCT 33.9 (L) 09/08/2021   MCV 94.4 09/08/2021   PLT 297 09/08/2021      Component Value Date/Time   NA 137 08/21/2021 0505   NA 135 (L) 08/10/2013 0554   K 3.5 08/21/2021 0505   K 3.6 08/10/2013 0554   CL 104 08/21/2021 0505   CL 104 08/10/2013 0554   CO2 27 08/21/2021 0505   CO2 24 08/10/2013 0554   GLUCOSE 153 (H) 08/21/2021 0505   GLUCOSE 143 (H) 08/10/2013 0554   BUN 19 08/21/2021 0505   BUN 12 08/10/2013 0554   CREATININE 0.85 08/21/2021 0505   CREATININE 0.98 08/10/2013 0554   CALCIUM 7.8 (L) 08/21/2021 0505   CALCIUM 8.4 (L) 08/10/2013 0554   PROT 6.5 08/18/2021 0649   ALBUMIN 2.9 (L) 08/18/2021 0649   AST 18 08/18/2021 0649   ALT 15 08/18/2021 0649   ALKPHOS 71 08/18/2021 0649   BILITOT 0.3 08/18/2021 0649   GFRNONAA >60 08/21/2021 0505   GFRNONAA >60 08/10/2013 0554   GFRAA >60 12/28/2018 1229   GFRAA >60 08/10/2013 0554   Lab Results  Component Value Date   CHOL 163 12/17/2016  HDL 48 12/17/2016   LDLCALC 85 12/17/2016   TRIG 152 (H) 12/17/2016   CHOLHDL 3.4 12/17/2016   Lab Results  Component Value Date   HGBA1C 7.3 (H) 08/17/2021   Lab Results  Component Value Date   VITAMINB12 205 08/18/2021   Lab Results  Component Value Date   TSH 3.560 08/18/2021    MRI Brain 07/07/20 Normal aging brain.    ASSESSMENT AND PLAN  78 y.o. year old male with atrial fibrillation, peripheral neuropathy, history of stroke, hypertension, hyperlipidemia, and diabetes mellitus type 2 who is presenting for follow up for his neuropathy.  He is still symptomatic,  we will continue him on duloxetine 60 mg daily and increase his pregabalin to 25 mg in the morning and 450 mg at night.  Advised him to contact me for any other concerns.  I will see him in 1 year for follow-up or sooner if worse.   1. Peripheral polyneuropathy   2. Other diabetic neurological complication associated with type 2 diabetes mellitus (HCC)   3. Gait abnormality     Patient Instructions  Increase pregabalin to 225 mg in the morning and 450 mg at night Continue your other medications Continue to follow with your doctor Continue to use a device with ambulation Follow-up in 1 year or sooner if worse.   No orders of the defined types were placed in this encounter.   Meds ordered this encounter  Medications   DULoxetine (CYMBALTA) 60 MG capsule    Sig: Take 1 capsule (60 mg total) by mouth every evening.    Dispense:  90 capsule    Refill:  3   pregabalin (LYRICA) 225 MG capsule    Sig: Take 1 capsule (225 mg total) by mouth daily AND 2 capsules (450 mg total) every evening.    Dispense:  270 capsule    Refill:  3    This request is for a new prescription for a controlled substance as required by Federal/State law.    Return in about 6 months (around 06/23/2024).   Windell Norfolk, MD 12/24/2023, 2:17 PM  [facility] [address], [address] [place], [place] [address] [phone]

## 2023-12-24 NOTE — Patient Instructions (Signed)
 Increase pregabalin to 225 mg in the morning and 450 mg at night Continue your other medications Continue to follow with your doctor Continue to use a device with ambulation Follow-up in 1 year or sooner if worse.

## 2023-12-28 ENCOUNTER — Encounter: Payer: Self-pay | Admitting: Physician Assistant

## 2023-12-28 DIAGNOSIS — E1142 Type 2 diabetes mellitus with diabetic polyneuropathy: Secondary | ICD-10-CM | POA: Diagnosis not present

## 2023-12-28 DIAGNOSIS — E11621 Type 2 diabetes mellitus with foot ulcer: Secondary | ICD-10-CM | POA: Diagnosis not present

## 2023-12-29 DIAGNOSIS — Z7901 Long term (current) use of anticoagulants: Secondary | ICD-10-CM | POA: Diagnosis not present

## 2023-12-29 DIAGNOSIS — Z952 Presence of prosthetic heart valve: Secondary | ICD-10-CM | POA: Diagnosis not present

## 2024-01-05 DIAGNOSIS — M47896 Other spondylosis, lumbar region: Secondary | ICD-10-CM | POA: Diagnosis not present

## 2024-01-05 DIAGNOSIS — B078 Other viral warts: Secondary | ICD-10-CM | POA: Diagnosis not present

## 2024-01-05 DIAGNOSIS — G894 Chronic pain syndrome: Secondary | ICD-10-CM | POA: Diagnosis not present

## 2024-01-05 DIAGNOSIS — L570 Actinic keratosis: Secondary | ICD-10-CM | POA: Diagnosis not present

## 2024-01-13 DIAGNOSIS — E538 Deficiency of other specified B group vitamins: Secondary | ICD-10-CM | POA: Diagnosis not present

## 2024-01-17 DIAGNOSIS — I350 Nonrheumatic aortic (valve) stenosis: Secondary | ICD-10-CM | POA: Diagnosis not present

## 2024-01-17 DIAGNOSIS — I6523 Occlusion and stenosis of bilateral carotid arteries: Secondary | ICD-10-CM | POA: Diagnosis not present

## 2024-01-17 DIAGNOSIS — I2510 Atherosclerotic heart disease of native coronary artery without angina pectoris: Secondary | ICD-10-CM | POA: Diagnosis not present

## 2024-01-17 DIAGNOSIS — Z952 Presence of prosthetic heart valve: Secondary | ICD-10-CM | POA: Diagnosis not present

## 2024-01-17 DIAGNOSIS — I6310 Cerebral infarction due to embolism of unspecified precerebral artery: Secondary | ICD-10-CM | POA: Diagnosis not present

## 2024-01-17 DIAGNOSIS — E782 Mixed hyperlipidemia: Secondary | ICD-10-CM | POA: Diagnosis not present

## 2024-01-17 DIAGNOSIS — R600 Localized edema: Secondary | ICD-10-CM | POA: Diagnosis not present

## 2024-01-17 DIAGNOSIS — I10 Essential (primary) hypertension: Secondary | ICD-10-CM | POA: Diagnosis not present

## 2024-01-17 DIAGNOSIS — I480 Paroxysmal atrial fibrillation: Secondary | ICD-10-CM | POA: Diagnosis not present

## 2024-01-26 DIAGNOSIS — L0390 Cellulitis, unspecified: Secondary | ICD-10-CM | POA: Diagnosis not present

## 2024-01-26 DIAGNOSIS — L03113 Cellulitis of right upper limb: Secondary | ICD-10-CM | POA: Diagnosis not present

## 2024-01-28 DIAGNOSIS — I480 Paroxysmal atrial fibrillation: Secondary | ICD-10-CM | POA: Diagnosis not present

## 2024-01-28 DIAGNOSIS — Z952 Presence of prosthetic heart valve: Secondary | ICD-10-CM | POA: Diagnosis not present

## 2024-02-04 DIAGNOSIS — L03114 Cellulitis of left upper limb: Secondary | ICD-10-CM | POA: Diagnosis not present

## 2024-02-08 DIAGNOSIS — Z7901 Long term (current) use of anticoagulants: Secondary | ICD-10-CM | POA: Diagnosis not present

## 2024-02-08 DIAGNOSIS — R0600 Dyspnea, unspecified: Secondary | ICD-10-CM | POA: Diagnosis not present

## 2024-02-08 DIAGNOSIS — J449 Chronic obstructive pulmonary disease, unspecified: Secondary | ICD-10-CM | POA: Diagnosis not present

## 2024-02-08 DIAGNOSIS — Z952 Presence of prosthetic heart valve: Secondary | ICD-10-CM | POA: Diagnosis not present

## 2024-02-08 DIAGNOSIS — J849 Interstitial pulmonary disease, unspecified: Secondary | ICD-10-CM | POA: Diagnosis not present

## 2024-02-11 DIAGNOSIS — L03113 Cellulitis of right upper limb: Secondary | ICD-10-CM | POA: Diagnosis not present

## 2024-02-16 DIAGNOSIS — Z79899 Other long term (current) drug therapy: Secondary | ICD-10-CM | POA: Diagnosis not present

## 2024-02-16 DIAGNOSIS — G894 Chronic pain syndrome: Secondary | ICD-10-CM | POA: Diagnosis not present

## 2024-02-16 DIAGNOSIS — M47896 Other spondylosis, lumbar region: Secondary | ICD-10-CM | POA: Diagnosis not present

## 2024-02-17 DIAGNOSIS — Z952 Presence of prosthetic heart valve: Secondary | ICD-10-CM | POA: Diagnosis not present

## 2024-02-17 DIAGNOSIS — E538 Deficiency of other specified B group vitamins: Secondary | ICD-10-CM | POA: Diagnosis not present

## 2024-02-18 DIAGNOSIS — L03113 Cellulitis of right upper limb: Secondary | ICD-10-CM | POA: Diagnosis not present

## 2024-02-18 DIAGNOSIS — L03011 Cellulitis of right finger: Secondary | ICD-10-CM | POA: Diagnosis not present

## 2024-03-06 ENCOUNTER — Telehealth: Payer: Self-pay | Admitting: Neurology

## 2024-03-06 NOTE — Telephone Encounter (Signed)
 Appointment details confirmed

## 2024-03-07 ENCOUNTER — Ambulatory Visit: Payer: Medicare HMO | Admitting: Neurology

## 2024-03-08 DIAGNOSIS — Z952 Presence of prosthetic heart valve: Secondary | ICD-10-CM | POA: Diagnosis not present

## 2024-03-08 DIAGNOSIS — Z7901 Long term (current) use of anticoagulants: Secondary | ICD-10-CM | POA: Diagnosis not present

## 2024-03-09 DIAGNOSIS — L03114 Cellulitis of left upper limb: Secondary | ICD-10-CM | POA: Diagnosis not present

## 2024-03-09 DIAGNOSIS — L03011 Cellulitis of right finger: Secondary | ICD-10-CM | POA: Diagnosis not present

## 2024-03-09 DIAGNOSIS — M720 Palmar fascial fibromatosis [Dupuytren]: Secondary | ICD-10-CM | POA: Diagnosis not present

## 2024-03-09 DIAGNOSIS — M65321 Trigger finger, right index finger: Secondary | ICD-10-CM | POA: Diagnosis not present

## 2024-03-09 DIAGNOSIS — L03113 Cellulitis of right upper limb: Secondary | ICD-10-CM | POA: Diagnosis not present

## 2024-03-20 DIAGNOSIS — E538 Deficiency of other specified B group vitamins: Secondary | ICD-10-CM | POA: Diagnosis not present

## 2024-03-20 DIAGNOSIS — Z952 Presence of prosthetic heart valve: Secondary | ICD-10-CM | POA: Diagnosis not present

## 2024-03-20 DIAGNOSIS — Z7901 Long term (current) use of anticoagulants: Secondary | ICD-10-CM | POA: Diagnosis not present

## 2024-03-21 ENCOUNTER — Encounter: Payer: Self-pay | Attending: Physician Assistant | Admitting: Physician Assistant

## 2024-03-21 DIAGNOSIS — E11621 Type 2 diabetes mellitus with foot ulcer: Secondary | ICD-10-CM | POA: Diagnosis not present

## 2024-03-21 DIAGNOSIS — L97422 Non-pressure chronic ulcer of left heel and midfoot with fat layer exposed: Secondary | ICD-10-CM | POA: Insufficient documentation

## 2024-03-21 DIAGNOSIS — E1142 Type 2 diabetes mellitus with diabetic polyneuropathy: Secondary | ICD-10-CM | POA: Insufficient documentation

## 2024-03-28 ENCOUNTER — Ambulatory Visit: Payer: Self-pay | Admitting: Physician Assistant

## 2024-03-29 DIAGNOSIS — M545 Low back pain, unspecified: Secondary | ICD-10-CM | POA: Diagnosis not present

## 2024-03-29 DIAGNOSIS — G894 Chronic pain syndrome: Secondary | ICD-10-CM | POA: Diagnosis not present

## 2024-03-29 DIAGNOSIS — G8929 Other chronic pain: Secondary | ICD-10-CM | POA: Diagnosis not present

## 2024-04-04 DIAGNOSIS — L03113 Cellulitis of right upper limb: Secondary | ICD-10-CM | POA: Diagnosis not present

## 2024-04-05 ENCOUNTER — Encounter: Admitting: Internal Medicine

## 2024-04-05 DIAGNOSIS — E782 Mixed hyperlipidemia: Secondary | ICD-10-CM | POA: Diagnosis not present

## 2024-04-05 DIAGNOSIS — L97422 Non-pressure chronic ulcer of left heel and midfoot with fat layer exposed: Secondary | ICD-10-CM | POA: Diagnosis not present

## 2024-04-05 DIAGNOSIS — Z952 Presence of prosthetic heart valve: Secondary | ICD-10-CM | POA: Diagnosis not present

## 2024-04-05 DIAGNOSIS — E0590 Thyrotoxicosis, unspecified without thyrotoxic crisis or storm: Secondary | ICD-10-CM | POA: Diagnosis not present

## 2024-04-05 DIAGNOSIS — E11621 Type 2 diabetes mellitus with foot ulcer: Secondary | ICD-10-CM | POA: Diagnosis not present

## 2024-04-05 DIAGNOSIS — Z7901 Long term (current) use of anticoagulants: Secondary | ICD-10-CM | POA: Diagnosis not present

## 2024-04-05 DIAGNOSIS — E118 Type 2 diabetes mellitus with unspecified complications: Secondary | ICD-10-CM | POA: Diagnosis not present

## 2024-04-05 DIAGNOSIS — Z79899 Other long term (current) drug therapy: Secondary | ICD-10-CM | POA: Diagnosis not present

## 2024-04-12 ENCOUNTER — Encounter: Admitting: Physician Assistant

## 2024-04-12 DIAGNOSIS — Z7901 Long term (current) use of anticoagulants: Secondary | ICD-10-CM | POA: Diagnosis not present

## 2024-04-12 DIAGNOSIS — Z79899 Other long term (current) drug therapy: Secondary | ICD-10-CM | POA: Diagnosis not present

## 2024-04-12 DIAGNOSIS — I480 Paroxysmal atrial fibrillation: Secondary | ICD-10-CM | POA: Diagnosis not present

## 2024-04-12 DIAGNOSIS — Z0000 Encounter for general adult medical examination without abnormal findings: Secondary | ICD-10-CM | POA: Diagnosis not present

## 2024-04-12 DIAGNOSIS — L97422 Non-pressure chronic ulcer of left heel and midfoot with fat layer exposed: Secondary | ICD-10-CM | POA: Diagnosis not present

## 2024-04-12 DIAGNOSIS — E11621 Type 2 diabetes mellitus with foot ulcer: Secondary | ICD-10-CM | POA: Diagnosis not present

## 2024-04-12 DIAGNOSIS — D649 Anemia, unspecified: Secondary | ICD-10-CM | POA: Diagnosis not present

## 2024-04-12 DIAGNOSIS — E118 Type 2 diabetes mellitus with unspecified complications: Secondary | ICD-10-CM | POA: Diagnosis not present

## 2024-04-12 DIAGNOSIS — I10 Essential (primary) hypertension: Secondary | ICD-10-CM | POA: Diagnosis not present

## 2024-04-12 DIAGNOSIS — E0590 Thyrotoxicosis, unspecified without thyrotoxic crisis or storm: Secondary | ICD-10-CM | POA: Diagnosis not present

## 2024-04-12 DIAGNOSIS — G894 Chronic pain syndrome: Secondary | ICD-10-CM | POA: Diagnosis not present

## 2024-04-12 DIAGNOSIS — E782 Mixed hyperlipidemia: Secondary | ICD-10-CM | POA: Diagnosis not present

## 2024-04-12 DIAGNOSIS — Z1331 Encounter for screening for depression: Secondary | ICD-10-CM | POA: Diagnosis not present

## 2024-04-12 DIAGNOSIS — E538 Deficiency of other specified B group vitamins: Secondary | ICD-10-CM | POA: Diagnosis not present

## 2024-04-12 DIAGNOSIS — Z952 Presence of prosthetic heart valve: Secondary | ICD-10-CM | POA: Diagnosis not present

## 2024-04-13 ENCOUNTER — Other Ambulatory Visit: Payer: Self-pay | Admitting: Internal Medicine

## 2024-04-13 DIAGNOSIS — R42 Dizziness and giddiness: Secondary | ICD-10-CM

## 2024-04-13 DIAGNOSIS — Z0000 Encounter for general adult medical examination without abnormal findings: Secondary | ICD-10-CM

## 2024-04-18 ENCOUNTER — Encounter: Payer: Self-pay | Attending: Physician Assistant | Admitting: Physician Assistant

## 2024-04-18 DIAGNOSIS — L98492 Non-pressure chronic ulcer of skin of other sites with fat layer exposed: Secondary | ICD-10-CM | POA: Diagnosis not present

## 2024-04-18 DIAGNOSIS — L97422 Non-pressure chronic ulcer of left heel and midfoot with fat layer exposed: Secondary | ICD-10-CM | POA: Diagnosis not present

## 2024-04-18 DIAGNOSIS — E11621 Type 2 diabetes mellitus with foot ulcer: Secondary | ICD-10-CM | POA: Insufficient documentation

## 2024-04-18 DIAGNOSIS — E1140 Type 2 diabetes mellitus with diabetic neuropathy, unspecified: Secondary | ICD-10-CM | POA: Diagnosis not present

## 2024-04-25 ENCOUNTER — Encounter: Payer: Self-pay | Admitting: Physician Assistant

## 2024-04-25 DIAGNOSIS — E538 Deficiency of other specified B group vitamins: Secondary | ICD-10-CM | POA: Diagnosis not present

## 2024-04-25 DIAGNOSIS — Z952 Presence of prosthetic heart valve: Secondary | ICD-10-CM | POA: Diagnosis not present

## 2024-04-25 DIAGNOSIS — E11621 Type 2 diabetes mellitus with foot ulcer: Secondary | ICD-10-CM | POA: Diagnosis not present

## 2024-04-25 DIAGNOSIS — L97422 Non-pressure chronic ulcer of left heel and midfoot with fat layer exposed: Secondary | ICD-10-CM | POA: Diagnosis not present

## 2024-04-25 DIAGNOSIS — I10 Essential (primary) hypertension: Secondary | ICD-10-CM | POA: Diagnosis not present

## 2024-04-25 DIAGNOSIS — Z7901 Long term (current) use of anticoagulants: Secondary | ICD-10-CM | POA: Diagnosis not present

## 2024-04-26 DIAGNOSIS — M791 Myalgia, unspecified site: Secondary | ICD-10-CM | POA: Diagnosis not present

## 2024-04-26 DIAGNOSIS — M545 Low back pain, unspecified: Secondary | ICD-10-CM | POA: Diagnosis not present

## 2024-04-26 DIAGNOSIS — Z981 Arthrodesis status: Secondary | ICD-10-CM | POA: Diagnosis not present

## 2024-04-26 DIAGNOSIS — M47816 Spondylosis without myelopathy or radiculopathy, lumbar region: Secondary | ICD-10-CM | POA: Diagnosis not present

## 2024-04-26 DIAGNOSIS — G894 Chronic pain syndrome: Secondary | ICD-10-CM | POA: Diagnosis not present

## 2024-04-26 DIAGNOSIS — Z1330 Encounter for screening examination for mental health and behavioral disorders, unspecified: Secondary | ICD-10-CM | POA: Diagnosis not present

## 2024-04-26 DIAGNOSIS — G8929 Other chronic pain: Secondary | ICD-10-CM | POA: Diagnosis not present

## 2024-04-27 ENCOUNTER — Ambulatory Visit
Admission: RE | Admit: 2024-04-27 | Discharge: 2024-04-27 | Disposition: A | Discharge status: Home / Self Care | Source: Ambulatory Visit | Attending: Internal Medicine | Admitting: Internal Medicine

## 2024-04-27 DIAGNOSIS — R42 Dizziness and giddiness: Secondary | ICD-10-CM | POA: Insufficient documentation

## 2024-04-27 DIAGNOSIS — Z0000 Encounter for general adult medical examination without abnormal findings: Secondary | ICD-10-CM | POA: Diagnosis not present

## 2024-05-01 ENCOUNTER — Encounter: Payer: Self-pay | Admitting: Physician Assistant

## 2024-05-01 DIAGNOSIS — E11621 Type 2 diabetes mellitus with foot ulcer: Secondary | ICD-10-CM | POA: Diagnosis not present

## 2024-05-01 DIAGNOSIS — L97422 Non-pressure chronic ulcer of left heel and midfoot with fat layer exposed: Secondary | ICD-10-CM | POA: Diagnosis not present

## 2024-05-02 ENCOUNTER — Ambulatory Visit: Payer: Self-pay | Admitting: Physician Assistant

## 2024-05-02 DIAGNOSIS — M65331 Trigger finger, right middle finger: Secondary | ICD-10-CM | POA: Diagnosis not present

## 2024-05-02 DIAGNOSIS — E1140 Type 2 diabetes mellitus with diabetic neuropathy, unspecified: Secondary | ICD-10-CM | POA: Diagnosis not present

## 2024-05-02 DIAGNOSIS — M65321 Trigger finger, right index finger: Secondary | ICD-10-CM | POA: Diagnosis not present

## 2024-05-02 DIAGNOSIS — L03113 Cellulitis of right upper limb: Secondary | ICD-10-CM | POA: Diagnosis not present

## 2024-05-02 DIAGNOSIS — G629 Polyneuropathy, unspecified: Secondary | ICD-10-CM | POA: Diagnosis not present

## 2024-05-02 DIAGNOSIS — L97419 Non-pressure chronic ulcer of right heel and midfoot with unspecified severity: Secondary | ICD-10-CM | POA: Diagnosis not present

## 2024-05-02 DIAGNOSIS — S60422A Blister (nonthermal) of right middle finger, initial encounter: Secondary | ICD-10-CM | POA: Diagnosis not present

## 2024-05-09 ENCOUNTER — Ambulatory Visit: Admitting: Physician Assistant

## 2024-05-09 DIAGNOSIS — L97422 Non-pressure chronic ulcer of left heel and midfoot with fat layer exposed: Secondary | ICD-10-CM | POA: Diagnosis not present

## 2024-05-09 DIAGNOSIS — L98492 Non-pressure chronic ulcer of skin of other sites with fat layer exposed: Secondary | ICD-10-CM | POA: Diagnosis not present

## 2024-05-09 DIAGNOSIS — E11621 Type 2 diabetes mellitus with foot ulcer: Secondary | ICD-10-CM | POA: Diagnosis not present

## 2024-05-12 DIAGNOSIS — D649 Anemia, unspecified: Secondary | ICD-10-CM | POA: Diagnosis not present

## 2024-05-12 DIAGNOSIS — Z79899 Other long term (current) drug therapy: Secondary | ICD-10-CM | POA: Diagnosis not present

## 2024-05-16 ENCOUNTER — Encounter: Admitting: Physician Assistant

## 2024-05-16 DIAGNOSIS — L98492 Non-pressure chronic ulcer of skin of other sites with fat layer exposed: Secondary | ICD-10-CM | POA: Diagnosis not present

## 2024-05-16 DIAGNOSIS — E11621 Type 2 diabetes mellitus with foot ulcer: Secondary | ICD-10-CM | POA: Diagnosis not present

## 2024-05-16 DIAGNOSIS — L97422 Non-pressure chronic ulcer of left heel and midfoot with fat layer exposed: Secondary | ICD-10-CM | POA: Diagnosis not present

## 2024-05-17 ENCOUNTER — Other Ambulatory Visit: Payer: Self-pay | Admitting: Physician Assistant

## 2024-05-17 ENCOUNTER — Ambulatory Visit
Admission: RE | Admit: 2024-05-17 | Discharge: 2024-05-17 | Disposition: A | Discharge status: Home / Self Care | Source: Ambulatory Visit | Attending: Physician Assistant | Admitting: Physician Assistant

## 2024-05-17 DIAGNOSIS — M869 Osteomyelitis, unspecified: Secondary | ICD-10-CM

## 2024-05-17 DIAGNOSIS — M8950 Osteolysis, unspecified site: Secondary | ICD-10-CM | POA: Diagnosis not present

## 2024-05-23 ENCOUNTER — Encounter: Attending: Physician Assistant | Admitting: Physician Assistant

## 2024-05-23 DIAGNOSIS — E1169 Type 2 diabetes mellitus with other specified complication: Secondary | ICD-10-CM | POA: Insufficient documentation

## 2024-05-23 DIAGNOSIS — L98494 Non-pressure chronic ulcer of skin of other sites with necrosis of bone: Secondary | ICD-10-CM | POA: Insufficient documentation

## 2024-05-23 DIAGNOSIS — E11621 Type 2 diabetes mellitus with foot ulcer: Secondary | ICD-10-CM | POA: Diagnosis not present

## 2024-05-23 DIAGNOSIS — E1142 Type 2 diabetes mellitus with diabetic polyneuropathy: Secondary | ICD-10-CM | POA: Insufficient documentation

## 2024-05-23 DIAGNOSIS — L97422 Non-pressure chronic ulcer of left heel and midfoot with fat layer exposed: Secondary | ICD-10-CM | POA: Diagnosis not present

## 2024-05-23 DIAGNOSIS — M86341 Chronic multifocal osteomyelitis, right hand: Secondary | ICD-10-CM | POA: Diagnosis not present

## 2024-05-29 ENCOUNTER — Encounter: Admitting: Physician Assistant

## 2024-05-29 DIAGNOSIS — E538 Deficiency of other specified B group vitamins: Secondary | ICD-10-CM | POA: Diagnosis not present

## 2024-05-29 DIAGNOSIS — Z952 Presence of prosthetic heart valve: Secondary | ICD-10-CM | POA: Diagnosis not present

## 2024-05-29 DIAGNOSIS — L98494 Non-pressure chronic ulcer of skin of other sites with necrosis of bone: Secondary | ICD-10-CM | POA: Diagnosis not present

## 2024-05-29 DIAGNOSIS — E1169 Type 2 diabetes mellitus with other specified complication: Secondary | ICD-10-CM | POA: Diagnosis not present

## 2024-05-29 DIAGNOSIS — E11621 Type 2 diabetes mellitus with foot ulcer: Secondary | ICD-10-CM | POA: Diagnosis not present

## 2024-05-29 DIAGNOSIS — L97422 Non-pressure chronic ulcer of left heel and midfoot with fat layer exposed: Secondary | ICD-10-CM | POA: Diagnosis not present

## 2024-05-30 ENCOUNTER — Encounter: Admitting: Physician Assistant

## 2024-06-05 ENCOUNTER — Encounter: Admitting: Physician Assistant

## 2024-06-05 DIAGNOSIS — E1169 Type 2 diabetes mellitus with other specified complication: Secondary | ICD-10-CM | POA: Diagnosis not present

## 2024-06-05 DIAGNOSIS — E11621 Type 2 diabetes mellitus with foot ulcer: Secondary | ICD-10-CM | POA: Diagnosis not present

## 2024-06-05 DIAGNOSIS — L98494 Non-pressure chronic ulcer of skin of other sites with necrosis of bone: Secondary | ICD-10-CM | POA: Diagnosis not present

## 2024-06-05 DIAGNOSIS — L97422 Non-pressure chronic ulcer of left heel and midfoot with fat layer exposed: Secondary | ICD-10-CM | POA: Diagnosis not present

## 2024-06-06 ENCOUNTER — Encounter: Admitting: Physician Assistant

## 2024-06-06 DIAGNOSIS — E1169 Type 2 diabetes mellitus with other specified complication: Secondary | ICD-10-CM | POA: Diagnosis not present

## 2024-06-06 DIAGNOSIS — E0590 Thyrotoxicosis, unspecified without thyrotoxic crisis or storm: Secondary | ICD-10-CM | POA: Diagnosis not present

## 2024-06-06 DIAGNOSIS — M86641 Other chronic osteomyelitis, right hand: Secondary | ICD-10-CM | POA: Diagnosis not present

## 2024-06-06 LAB — GLUCOSE, CAPILLARY
Glucose-Capillary: 117 mg/dL — ABNORMAL HIGH (ref 70–99)
Glucose-Capillary: 106 mg/dL — ABNORMAL HIGH (ref 70–99)

## 2024-06-07 ENCOUNTER — Encounter: Admitting: Physician Assistant

## 2024-06-07 ENCOUNTER — Ambulatory Visit: Admitting: Physician Assistant

## 2024-06-07 DIAGNOSIS — M86641 Other chronic osteomyelitis, right hand: Secondary | ICD-10-CM | POA: Diagnosis not present

## 2024-06-07 DIAGNOSIS — E1169 Type 2 diabetes mellitus with other specified complication: Secondary | ICD-10-CM | POA: Diagnosis not present

## 2024-06-07 LAB — GLUCOSE, CAPILLARY
Glucose-Capillary: 119 mg/dL — ABNORMAL HIGH (ref 70–99)
Glucose-Capillary: 84 mg/dL (ref 70–99)

## 2024-06-08 ENCOUNTER — Encounter: Admitting: Physician Assistant

## 2024-06-08 DIAGNOSIS — E1169 Type 2 diabetes mellitus with other specified complication: Secondary | ICD-10-CM | POA: Diagnosis not present

## 2024-06-08 DIAGNOSIS — M86641 Other chronic osteomyelitis, right hand: Secondary | ICD-10-CM | POA: Diagnosis not present

## 2024-06-08 LAB — GLUCOSE, CAPILLARY
Glucose-Capillary: 106 mg/dL — ABNORMAL HIGH (ref 70–99)
Glucose-Capillary: 93 mg/dL (ref 70–99)

## 2024-06-12 ENCOUNTER — Encounter: Admitting: Internal Medicine

## 2024-06-13 ENCOUNTER — Encounter: Admitting: Internal Medicine

## 2024-06-13 DIAGNOSIS — E1169 Type 2 diabetes mellitus with other specified complication: Secondary | ICD-10-CM | POA: Diagnosis not present

## 2024-06-13 DIAGNOSIS — M86641 Other chronic osteomyelitis, right hand: Secondary | ICD-10-CM | POA: Diagnosis not present

## 2024-06-13 LAB — GLUCOSE, CAPILLARY
Glucose-Capillary: 208 mg/dL — ABNORMAL HIGH (ref 70–99)
Glucose-Capillary: 89 mg/dL (ref 70–99)

## 2024-06-14 ENCOUNTER — Encounter: Admitting: Internal Medicine

## 2024-06-14 DIAGNOSIS — E11621 Type 2 diabetes mellitus with foot ulcer: Secondary | ICD-10-CM | POA: Diagnosis not present

## 2024-06-14 DIAGNOSIS — L97422 Non-pressure chronic ulcer of left heel and midfoot with fat layer exposed: Secondary | ICD-10-CM | POA: Diagnosis not present

## 2024-06-14 DIAGNOSIS — M86641 Other chronic osteomyelitis, right hand: Secondary | ICD-10-CM | POA: Diagnosis not present

## 2024-06-14 DIAGNOSIS — L98494 Non-pressure chronic ulcer of skin of other sites with necrosis of bone: Secondary | ICD-10-CM | POA: Diagnosis not present

## 2024-06-14 DIAGNOSIS — E1169 Type 2 diabetes mellitus with other specified complication: Secondary | ICD-10-CM | POA: Diagnosis not present

## 2024-06-14 LAB — GLUCOSE, CAPILLARY
Glucose-Capillary: 109 mg/dL — ABNORMAL HIGH (ref 70–99)
Glucose-Capillary: 121 mg/dL — ABNORMAL HIGH (ref 70–99)
Glucose-Capillary: 84 mg/dL (ref 70–99)

## 2024-06-15 ENCOUNTER — Encounter: Admitting: Internal Medicine

## 2024-06-15 DIAGNOSIS — E1169 Type 2 diabetes mellitus with other specified complication: Secondary | ICD-10-CM | POA: Diagnosis not present

## 2024-06-15 DIAGNOSIS — M86641 Other chronic osteomyelitis, right hand: Secondary | ICD-10-CM | POA: Diagnosis not present

## 2024-06-15 LAB — GLUCOSE, CAPILLARY
Glucose-Capillary: 110 mg/dL — ABNORMAL HIGH (ref 70–99)
Glucose-Capillary: 107 mg/dL — ABNORMAL HIGH (ref 70–99)
Glucose-Capillary: 117 mg/dL — ABNORMAL HIGH (ref 70–99)
Glucose-Capillary: 96 mg/dL (ref 70–99)

## 2024-06-20 ENCOUNTER — Encounter: Attending: Physician Assistant | Admitting: Physician Assistant

## 2024-06-20 DIAGNOSIS — I10 Essential (primary) hypertension: Secondary | ICD-10-CM | POA: Diagnosis not present

## 2024-06-20 DIAGNOSIS — E1142 Type 2 diabetes mellitus with diabetic polyneuropathy: Secondary | ICD-10-CM | POA: Insufficient documentation

## 2024-06-20 DIAGNOSIS — M86341 Chronic multifocal osteomyelitis, right hand: Secondary | ICD-10-CM | POA: Insufficient documentation

## 2024-06-20 DIAGNOSIS — I350 Nonrheumatic aortic (valve) stenosis: Secondary | ICD-10-CM | POA: Diagnosis not present

## 2024-06-20 DIAGNOSIS — E782 Mixed hyperlipidemia: Secondary | ICD-10-CM | POA: Diagnosis not present

## 2024-06-20 DIAGNOSIS — L97422 Non-pressure chronic ulcer of left heel and midfoot with fat layer exposed: Secondary | ICD-10-CM | POA: Diagnosis not present

## 2024-06-20 DIAGNOSIS — M86641 Other chronic osteomyelitis, right hand: Secondary | ICD-10-CM | POA: Diagnosis not present

## 2024-06-20 DIAGNOSIS — Z9889 Other specified postprocedural states: Secondary | ICD-10-CM | POA: Diagnosis not present

## 2024-06-20 DIAGNOSIS — E11621 Type 2 diabetes mellitus with foot ulcer: Secondary | ICD-10-CM | POA: Insufficient documentation

## 2024-06-20 DIAGNOSIS — L98494 Non-pressure chronic ulcer of skin of other sites with necrosis of bone: Secondary | ICD-10-CM | POA: Insufficient documentation

## 2024-06-20 DIAGNOSIS — Z7901 Long term (current) use of anticoagulants: Secondary | ICD-10-CM | POA: Diagnosis not present

## 2024-06-20 DIAGNOSIS — Z952 Presence of prosthetic heart valve: Secondary | ICD-10-CM | POA: Diagnosis not present

## 2024-06-20 DIAGNOSIS — I480 Paroxysmal atrial fibrillation: Secondary | ICD-10-CM | POA: Diagnosis not present

## 2024-06-20 DIAGNOSIS — Z8679 Personal history of other diseases of the circulatory system: Secondary | ICD-10-CM | POA: Diagnosis not present

## 2024-06-20 DIAGNOSIS — E1169 Type 2 diabetes mellitus with other specified complication: Secondary | ICD-10-CM | POA: Insufficient documentation

## 2024-06-20 LAB — GLUCOSE, CAPILLARY
Glucose-Capillary: 126 mg/dL — ABNORMAL HIGH (ref 70–99)
Glucose-Capillary: 122 mg/dL — ABNORMAL HIGH (ref 70–99)

## 2024-06-21 ENCOUNTER — Encounter: Admitting: Physician Assistant

## 2024-06-21 DIAGNOSIS — G8929 Other chronic pain: Secondary | ICD-10-CM | POA: Diagnosis not present

## 2024-06-21 DIAGNOSIS — M545 Low back pain, unspecified: Secondary | ICD-10-CM | POA: Diagnosis not present

## 2024-06-21 DIAGNOSIS — Z981 Arthrodesis status: Secondary | ICD-10-CM | POA: Diagnosis not present

## 2024-06-21 DIAGNOSIS — G894 Chronic pain syndrome: Secondary | ICD-10-CM | POA: Diagnosis not present

## 2024-06-21 DIAGNOSIS — E1169 Type 2 diabetes mellitus with other specified complication: Secondary | ICD-10-CM | POA: Diagnosis not present

## 2024-06-21 DIAGNOSIS — M86641 Other chronic osteomyelitis, right hand: Secondary | ICD-10-CM | POA: Diagnosis not present

## 2024-06-21 LAB — GLUCOSE, CAPILLARY
Glucose-Capillary: 100 mg/dL — ABNORMAL HIGH (ref 70–99)
Glucose-Capillary: 124 mg/dL — ABNORMAL HIGH (ref 70–99)
Glucose-Capillary: 108 mg/dL — ABNORMAL HIGH (ref 70–99)

## 2024-06-22 ENCOUNTER — Encounter: Admitting: Physician Assistant

## 2024-06-22 DIAGNOSIS — L97422 Non-pressure chronic ulcer of left heel and midfoot with fat layer exposed: Secondary | ICD-10-CM | POA: Diagnosis not present

## 2024-06-22 DIAGNOSIS — L98494 Non-pressure chronic ulcer of skin of other sites with necrosis of bone: Secondary | ICD-10-CM | POA: Diagnosis not present

## 2024-06-22 DIAGNOSIS — E1169 Type 2 diabetes mellitus with other specified complication: Secondary | ICD-10-CM | POA: Diagnosis not present

## 2024-06-22 DIAGNOSIS — M86641 Other chronic osteomyelitis, right hand: Secondary | ICD-10-CM | POA: Diagnosis not present

## 2024-06-22 DIAGNOSIS — E11621 Type 2 diabetes mellitus with foot ulcer: Secondary | ICD-10-CM | POA: Diagnosis not present

## 2024-06-22 LAB — GLUCOSE, CAPILLARY
Glucose-Capillary: 114 mg/dL — ABNORMAL HIGH (ref 70–99)
Glucose-Capillary: 103 mg/dL — ABNORMAL HIGH (ref 70–99)

## 2024-06-26 ENCOUNTER — Ambulatory Visit: Admitting: Physician Assistant

## 2024-06-26 ENCOUNTER — Encounter: Admitting: Physician Assistant

## 2024-06-26 DIAGNOSIS — M86641 Other chronic osteomyelitis, right hand: Secondary | ICD-10-CM | POA: Diagnosis not present

## 2024-06-26 DIAGNOSIS — E1169 Type 2 diabetes mellitus with other specified complication: Secondary | ICD-10-CM | POA: Diagnosis not present

## 2024-06-26 LAB — GLUCOSE, CAPILLARY
Glucose-Capillary: 164 mg/dL — ABNORMAL HIGH (ref 70–99)
Glucose-Capillary: 122 mg/dL — ABNORMAL HIGH (ref 70–99)

## 2024-06-27 ENCOUNTER — Encounter: Admitting: Physician Assistant

## 2024-06-27 DIAGNOSIS — E1169 Type 2 diabetes mellitus with other specified complication: Secondary | ICD-10-CM | POA: Diagnosis not present

## 2024-06-27 DIAGNOSIS — M86641 Other chronic osteomyelitis, right hand: Secondary | ICD-10-CM | POA: Diagnosis not present

## 2024-06-27 LAB — GLUCOSE, CAPILLARY
Glucose-Capillary: 181 mg/dL — ABNORMAL HIGH (ref 70–99)
Glucose-Capillary: 111 mg/dL — ABNORMAL HIGH (ref 70–99)

## 2024-06-28 ENCOUNTER — Other Ambulatory Visit: Payer: Self-pay | Admitting: Neurology

## 2024-06-28 ENCOUNTER — Encounter: Admitting: Physician Assistant

## 2024-06-28 DIAGNOSIS — M86641 Other chronic osteomyelitis, right hand: Secondary | ICD-10-CM | POA: Diagnosis not present

## 2024-06-28 DIAGNOSIS — E1169 Type 2 diabetes mellitus with other specified complication: Secondary | ICD-10-CM | POA: Diagnosis not present

## 2024-06-28 LAB — GLUCOSE, CAPILLARY
Glucose-Capillary: 172 mg/dL — ABNORMAL HIGH (ref 70–99)
Glucose-Capillary: 100 mg/dL — ABNORMAL HIGH (ref 70–99)

## 2024-06-28 MED ORDER — PREGABALIN 225 MG PO CAPS: ORAL_CAPSULE | ORAL | 0 refills | Status: DC

## 2024-06-28 NOTE — Telephone Encounter (Signed)
 Pt has r/s his appointment due to a medical procedure conflict, he has r/s with wait list. Pt is asking for a Rx for his pregabalin  (LYRICA ) 150 MG capsule to be sent to [facility] [phone]

## 2024-06-28 NOTE — Telephone Encounter (Signed)
 Dispensed Days Supply Quantity Provider Pharmacy  PREGABALIN  225 MG CAPSULE 03/23/2024 90 270 each Camara, Amadou, MD [facility] [phone] - M...  PREGABALIN  150 MG CAPSULE 03/22/2024 30 90 each Onita Duos, MD [facility] [phone] - M...  PREGABALIN  225 MG CAPSULE 12/27/2023 90 270 each Camara, Amadou, MD [facility] [phone] - M...  PREGABALIN  150 MG CAPSULE 12/03/2023 30 90 each Onita Duos, MD [facility] [phone] - M...  PREGABALIN  150 MG CAPSULE 10/21/2023 30 120 each Onita Duos, MD [facility] [phone] - M...  PREGABALIN  150 MG CAPSULE 09/04/2023 30 120 each Camara, Amadou, MD [facility] [phone] - M...  PREGABALIN  150 MG CAPSULE 08/02/2023 30 120 each Camara, Amadou, MD [facility] [phone] - M...       Last visit 12/22/23 Next visit 02/02/24

## 2024-06-29 ENCOUNTER — Ambulatory Visit: Admitting: Physician Assistant

## 2024-06-29 ENCOUNTER — Encounter: Admitting: Physician Assistant

## 2024-06-29 DIAGNOSIS — Z952 Presence of prosthetic heart valve: Secondary | ICD-10-CM | POA: Diagnosis not present

## 2024-06-29 DIAGNOSIS — Z7901 Long term (current) use of anticoagulants: Secondary | ICD-10-CM | POA: Diagnosis not present

## 2024-06-29 DIAGNOSIS — E538 Deficiency of other specified B group vitamins: Secondary | ICD-10-CM | POA: Diagnosis not present

## 2024-07-03 ENCOUNTER — Encounter: Admitting: Physician Assistant

## 2024-07-03 ENCOUNTER — Ambulatory Visit: Admitting: Physician Assistant

## 2024-07-04 ENCOUNTER — Encounter: Admitting: Physician Assistant

## 2024-07-05 ENCOUNTER — Encounter: Admitting: Physician Assistant

## 2024-07-05 ENCOUNTER — Ambulatory Visit: Admitting: Neurology

## 2024-07-06 ENCOUNTER — Encounter: Admitting: Physician Assistant

## 2024-07-06 DIAGNOSIS — L97422 Non-pressure chronic ulcer of left heel and midfoot with fat layer exposed: Secondary | ICD-10-CM | POA: Diagnosis not present

## 2024-07-06 DIAGNOSIS — E11621 Type 2 diabetes mellitus with foot ulcer: Secondary | ICD-10-CM | POA: Diagnosis not present

## 2024-07-06 DIAGNOSIS — E1169 Type 2 diabetes mellitus with other specified complication: Secondary | ICD-10-CM | POA: Diagnosis not present

## 2024-07-10 ENCOUNTER — Encounter: Admitting: Physician Assistant

## 2024-07-11 ENCOUNTER — Encounter: Admitting: Physician Assistant

## 2024-07-12 ENCOUNTER — Encounter: Admitting: Physician Assistant

## 2024-07-12 NOTE — Progress Notes (Signed)
 [name]                                          MRN: [id number]   07/12/2024   The [facility] Quality Team Specialist reviewed this patient medical record for the purposes of chart review for care gap closure. The following were reviewed: abstraction for care gap closure-controlling blood pressure.    [facility] Quality Team

## 2024-07-13 ENCOUNTER — Encounter: Admitting: Physician Assistant

## 2024-07-13 DIAGNOSIS — L97422 Non-pressure chronic ulcer of left heel and midfoot with fat layer exposed: Secondary | ICD-10-CM | POA: Diagnosis not present

## 2024-07-13 DIAGNOSIS — E11621 Type 2 diabetes mellitus with foot ulcer: Secondary | ICD-10-CM | POA: Diagnosis not present

## 2024-07-13 DIAGNOSIS — E1169 Type 2 diabetes mellitus with other specified complication: Secondary | ICD-10-CM | POA: Diagnosis not present

## 2024-07-14 DIAGNOSIS — E118 Type 2 diabetes mellitus with unspecified complications: Secondary | ICD-10-CM | POA: Diagnosis not present

## 2024-07-14 DIAGNOSIS — M79641 Pain in right hand: Secondary | ICD-10-CM | POA: Diagnosis not present

## 2024-07-14 DIAGNOSIS — E538 Deficiency of other specified B group vitamins: Secondary | ICD-10-CM | POA: Diagnosis not present

## 2024-07-14 DIAGNOSIS — G894 Chronic pain syndrome: Secondary | ICD-10-CM | POA: Diagnosis not present

## 2024-07-14 DIAGNOSIS — E782 Mixed hyperlipidemia: Secondary | ICD-10-CM | POA: Diagnosis not present

## 2024-07-14 DIAGNOSIS — M79642 Pain in left hand: Secondary | ICD-10-CM | POA: Diagnosis not present

## 2024-07-14 DIAGNOSIS — Z79899 Other long term (current) drug therapy: Secondary | ICD-10-CM | POA: Diagnosis not present

## 2024-07-14 DIAGNOSIS — I10 Essential (primary) hypertension: Secondary | ICD-10-CM | POA: Diagnosis not present

## 2024-07-14 DIAGNOSIS — R42 Dizziness and giddiness: Secondary | ICD-10-CM | POA: Diagnosis not present

## 2024-07-17 ENCOUNTER — Encounter: Admitting: Physician Assistant

## 2024-07-17 ENCOUNTER — Other Ambulatory Visit: Payer: Self-pay | Admitting: Internal Medicine

## 2024-07-17 DIAGNOSIS — R42 Dizziness and giddiness: Secondary | ICD-10-CM

## 2024-07-18 ENCOUNTER — Encounter: Admitting: Physician Assistant

## 2024-07-19 ENCOUNTER — Encounter: Admitting: Physician Assistant

## 2024-07-20 ENCOUNTER — Encounter: Attending: Physician Assistant | Admitting: Physician Assistant

## 2024-07-20 ENCOUNTER — Encounter: Admitting: Physician Assistant

## 2024-07-20 DIAGNOSIS — L97422 Non-pressure chronic ulcer of left heel and midfoot with fat layer exposed: Secondary | ICD-10-CM | POA: Insufficient documentation

## 2024-07-20 DIAGNOSIS — E1142 Type 2 diabetes mellitus with diabetic polyneuropathy: Secondary | ICD-10-CM | POA: Diagnosis not present

## 2024-07-20 DIAGNOSIS — M86341 Chronic multifocal osteomyelitis, right hand: Secondary | ICD-10-CM | POA: Diagnosis not present

## 2024-07-20 DIAGNOSIS — E11621 Type 2 diabetes mellitus with foot ulcer: Secondary | ICD-10-CM | POA: Insufficient documentation

## 2024-07-20 DIAGNOSIS — Z7901 Long term (current) use of anticoagulants: Secondary | ICD-10-CM | POA: Insufficient documentation

## 2024-07-20 DIAGNOSIS — L98494 Non-pressure chronic ulcer of skin of other sites with necrosis of bone: Secondary | ICD-10-CM | POA: Diagnosis not present

## 2024-07-24 ENCOUNTER — Ambulatory Visit
Admission: RE | Admit: 2024-07-24 | Discharge: 2024-07-24 | Disposition: A | Discharge status: Home / Self Care | Source: Ambulatory Visit | Attending: Internal Medicine | Admitting: Internal Medicine

## 2024-07-24 ENCOUNTER — Encounter: Admitting: Physician Assistant

## 2024-07-24 DIAGNOSIS — R42 Dizziness and giddiness: Secondary | ICD-10-CM | POA: Insufficient documentation

## 2024-07-24 DIAGNOSIS — G319 Degenerative disease of nervous system, unspecified: Secondary | ICD-10-CM | POA: Diagnosis not present

## 2024-07-24 DIAGNOSIS — I6782 Cerebral ischemia: Secondary | ICD-10-CM | POA: Diagnosis not present

## 2024-07-24 DIAGNOSIS — R262 Difficulty in walking, not elsewhere classified: Secondary | ICD-10-CM | POA: Diagnosis not present

## 2024-07-26 ENCOUNTER — Encounter: Admitting: Physician Assistant

## 2024-07-27 ENCOUNTER — Ambulatory Visit: Admitting: Physician Assistant

## 2024-07-27 ENCOUNTER — Encounter: Admitting: Physician Assistant

## 2024-07-31 ENCOUNTER — Encounter: Admitting: Physician Assistant

## 2024-07-31 DIAGNOSIS — Z952 Presence of prosthetic heart valve: Secondary | ICD-10-CM | POA: Diagnosis not present

## 2024-07-31 DIAGNOSIS — E538 Deficiency of other specified B group vitamins: Secondary | ICD-10-CM | POA: Diagnosis not present

## 2024-07-31 DIAGNOSIS — Z7901 Long term (current) use of anticoagulants: Secondary | ICD-10-CM | POA: Diagnosis not present

## 2024-08-02 ENCOUNTER — Encounter: Admitting: Physician Assistant

## 2024-08-02 DIAGNOSIS — L03113 Cellulitis of right upper limb: Secondary | ICD-10-CM | POA: Diagnosis not present

## 2024-08-02 DIAGNOSIS — E1140 Type 2 diabetes mellitus with diabetic neuropathy, unspecified: Secondary | ICD-10-CM | POA: Diagnosis not present

## 2024-08-02 DIAGNOSIS — I998 Other disorder of circulatory system: Secondary | ICD-10-CM | POA: Diagnosis not present

## 2024-08-02 DIAGNOSIS — M869 Osteomyelitis, unspecified: Secondary | ICD-10-CM | POA: Diagnosis not present

## 2024-08-03 ENCOUNTER — Encounter: Admitting: Physician Assistant

## 2024-08-03 DIAGNOSIS — M86341 Chronic multifocal osteomyelitis, right hand: Secondary | ICD-10-CM | POA: Diagnosis not present

## 2024-08-03 DIAGNOSIS — E11621 Type 2 diabetes mellitus with foot ulcer: Secondary | ICD-10-CM | POA: Diagnosis not present

## 2024-08-03 DIAGNOSIS — L97422 Non-pressure chronic ulcer of left heel and midfoot with fat layer exposed: Secondary | ICD-10-CM | POA: Diagnosis not present

## 2024-08-07 ENCOUNTER — Encounter: Admitting: Physician Assistant

## 2024-08-07 DIAGNOSIS — G8929 Other chronic pain: Secondary | ICD-10-CM | POA: Diagnosis not present

## 2024-08-07 DIAGNOSIS — G894 Chronic pain syndrome: Secondary | ICD-10-CM | POA: Diagnosis not present

## 2024-08-07 DIAGNOSIS — M545 Low back pain, unspecified: Secondary | ICD-10-CM | POA: Diagnosis not present

## 2024-08-07 DIAGNOSIS — Z981 Arthrodesis status: Secondary | ICD-10-CM | POA: Diagnosis not present

## 2024-08-09 ENCOUNTER — Encounter: Admitting: Physician Assistant

## 2024-08-10 ENCOUNTER — Encounter: Admitting: Physician Assistant

## 2024-08-10 DIAGNOSIS — M86341 Chronic multifocal osteomyelitis, right hand: Secondary | ICD-10-CM | POA: Diagnosis not present

## 2024-08-10 DIAGNOSIS — L97422 Non-pressure chronic ulcer of left heel and midfoot with fat layer exposed: Secondary | ICD-10-CM | POA: Diagnosis not present

## 2024-08-10 DIAGNOSIS — E11621 Type 2 diabetes mellitus with foot ulcer: Secondary | ICD-10-CM | POA: Diagnosis not present

## 2024-08-14 ENCOUNTER — Encounter: Admitting: Physician Assistant

## 2024-08-16 ENCOUNTER — Encounter: Admitting: Physician Assistant

## 2024-08-17 ENCOUNTER — Encounter: Admitting: Physician Assistant

## 2024-08-17 DIAGNOSIS — M86341 Chronic multifocal osteomyelitis, right hand: Secondary | ICD-10-CM | POA: Diagnosis not present

## 2024-08-17 DIAGNOSIS — L97422 Non-pressure chronic ulcer of left heel and midfoot with fat layer exposed: Secondary | ICD-10-CM | POA: Diagnosis not present

## 2024-08-17 DIAGNOSIS — E11621 Type 2 diabetes mellitus with foot ulcer: Secondary | ICD-10-CM | POA: Diagnosis not present

## 2024-08-21 ENCOUNTER — Encounter: Admitting: Physician Assistant

## 2024-08-23 ENCOUNTER — Encounter: Admitting: Physician Assistant

## 2024-08-24 ENCOUNTER — Encounter: Attending: Internal Medicine | Admitting: Internal Medicine

## 2024-08-24 ENCOUNTER — Encounter: Admitting: Physician Assistant

## 2024-08-24 DIAGNOSIS — E11622 Type 2 diabetes mellitus with other skin ulcer: Secondary | ICD-10-CM | POA: Insufficient documentation

## 2024-08-24 DIAGNOSIS — E1169 Type 2 diabetes mellitus with other specified complication: Secondary | ICD-10-CM | POA: Insufficient documentation

## 2024-08-24 DIAGNOSIS — E11621 Type 2 diabetes mellitus with foot ulcer: Secondary | ICD-10-CM | POA: Insufficient documentation

## 2024-08-24 DIAGNOSIS — L97422 Non-pressure chronic ulcer of left heel and midfoot with fat layer exposed: Secondary | ICD-10-CM | POA: Diagnosis not present

## 2024-08-24 DIAGNOSIS — M86341 Chronic multifocal osteomyelitis, right hand: Secondary | ICD-10-CM | POA: Insufficient documentation

## 2024-08-24 DIAGNOSIS — Z961 Presence of intraocular lens: Secondary | ICD-10-CM | POA: Diagnosis not present

## 2024-08-24 DIAGNOSIS — E1142 Type 2 diabetes mellitus with diabetic polyneuropathy: Secondary | ICD-10-CM | POA: Insufficient documentation

## 2024-08-24 DIAGNOSIS — H35371 Puckering of macula, right eye: Secondary | ICD-10-CM | POA: Diagnosis not present

## 2024-08-24 DIAGNOSIS — E119 Type 2 diabetes mellitus without complications: Secondary | ICD-10-CM | POA: Diagnosis not present

## 2024-08-24 DIAGNOSIS — M3501 Sicca syndrome with keratoconjunctivitis: Secondary | ICD-10-CM | POA: Diagnosis not present

## 2024-08-24 DIAGNOSIS — H53001 Unspecified amblyopia, right eye: Secondary | ICD-10-CM | POA: Diagnosis not present

## 2024-08-24 DIAGNOSIS — L98494 Non-pressure chronic ulcer of skin of other sites with necrosis of bone: Secondary | ICD-10-CM | POA: Diagnosis not present

## 2024-08-28 ENCOUNTER — Encounter: Admitting: Physician Assistant

## 2024-08-30 ENCOUNTER — Encounter: Admitting: Physician Assistant

## 2024-08-31 ENCOUNTER — Encounter: Admitting: Physician Assistant

## 2024-08-31 DIAGNOSIS — L97422 Non-pressure chronic ulcer of left heel and midfoot with fat layer exposed: Secondary | ICD-10-CM | POA: Diagnosis not present

## 2024-08-31 DIAGNOSIS — E11621 Type 2 diabetes mellitus with foot ulcer: Secondary | ICD-10-CM | POA: Diagnosis not present

## 2024-08-31 DIAGNOSIS — E538 Deficiency of other specified B group vitamins: Secondary | ICD-10-CM | POA: Diagnosis not present

## 2024-08-31 DIAGNOSIS — Z7901 Long term (current) use of anticoagulants: Secondary | ICD-10-CM | POA: Diagnosis not present

## 2024-08-31 DIAGNOSIS — Z952 Presence of prosthetic heart valve: Secondary | ICD-10-CM | POA: Diagnosis not present

## 2024-09-04 ENCOUNTER — Encounter: Admitting: Physician Assistant

## 2024-09-06 ENCOUNTER — Encounter: Admitting: Physician Assistant

## 2024-09-07 ENCOUNTER — Encounter: Admitting: Physician Assistant

## 2024-09-07 DIAGNOSIS — E11621 Type 2 diabetes mellitus with foot ulcer: Secondary | ICD-10-CM | POA: Diagnosis not present

## 2024-09-07 DIAGNOSIS — L97422 Non-pressure chronic ulcer of left heel and midfoot with fat layer exposed: Secondary | ICD-10-CM | POA: Diagnosis not present

## 2024-09-08 ENCOUNTER — Other Ambulatory Visit: Payer: Self-pay | Admitting: Neurology

## 2024-09-11 NOTE — Telephone Encounter (Signed)
 Pt is wanting to know when this will be filled for him. He sates pharmacy has advised him to call due to pharmacy not receiving any response. Please advise.

## 2024-09-11 NOTE — Telephone Encounter (Signed)
 Last seen on 12/22/23 Follow up scheduled on 02/02/24   Dispensed Days Supply Quantity Provider Pharmacy  PREGABALIN  225 MG CAPSULE 06/29/2024 90 270 each Camara, Amadou, MD [facility] [phone] - M...     Rx pending to be signed

## 2024-09-13 ENCOUNTER — Encounter: Admitting: Physician Assistant

## 2024-09-13 DIAGNOSIS — E11621 Type 2 diabetes mellitus with foot ulcer: Secondary | ICD-10-CM | POA: Diagnosis not present

## 2024-09-13 DIAGNOSIS — L97422 Non-pressure chronic ulcer of left heel and midfoot with fat layer exposed: Secondary | ICD-10-CM | POA: Diagnosis not present

## 2024-09-18 DIAGNOSIS — G894 Chronic pain syndrome: Secondary | ICD-10-CM | POA: Diagnosis not present

## 2024-09-18 DIAGNOSIS — M791 Myalgia, unspecified site: Secondary | ICD-10-CM | POA: Diagnosis not present

## 2024-09-18 DIAGNOSIS — M545 Low back pain, unspecified: Secondary | ICD-10-CM | POA: Diagnosis not present

## 2024-09-18 DIAGNOSIS — Z981 Arthrodesis status: Secondary | ICD-10-CM | POA: Diagnosis not present

## 2024-09-18 DIAGNOSIS — G8929 Other chronic pain: Secondary | ICD-10-CM | POA: Diagnosis not present

## 2024-09-21 ENCOUNTER — Encounter: Attending: Physician Assistant | Admitting: Physician Assistant

## 2024-09-21 DIAGNOSIS — M86341 Chronic multifocal osteomyelitis, right hand: Secondary | ICD-10-CM | POA: Diagnosis present

## 2024-09-21 DIAGNOSIS — L97422 Non-pressure chronic ulcer of left heel and midfoot with fat layer exposed: Secondary | ICD-10-CM | POA: Insufficient documentation

## 2024-09-21 DIAGNOSIS — E1142 Type 2 diabetes mellitus with diabetic polyneuropathy: Secondary | ICD-10-CM | POA: Diagnosis not present

## 2024-09-21 DIAGNOSIS — L97521 Non-pressure chronic ulcer of other part of left foot limited to breakdown of skin: Secondary | ICD-10-CM | POA: Diagnosis not present

## 2024-09-21 DIAGNOSIS — L98494 Non-pressure chronic ulcer of skin of other sites with necrosis of bone: Secondary | ICD-10-CM | POA: Insufficient documentation

## 2024-09-21 DIAGNOSIS — E11621 Type 2 diabetes mellitus with foot ulcer: Secondary | ICD-10-CM | POA: Insufficient documentation

## 2024-09-25 NOTE — Telephone Encounter (Signed)
 Pt called to follow up on medication refill pregabalin  (LYRICA ) 225 MG capsule   Pt stated that medication request was sent but never received  medication at Pharmacy the patient stated he  is about to be completely out of medication  and would like to know if he can get medication  tonight or at least tomorrow

## 2024-09-28 ENCOUNTER — Encounter: Admitting: Internal Medicine

## 2024-09-28 DIAGNOSIS — E11621 Type 2 diabetes mellitus with foot ulcer: Secondary | ICD-10-CM | POA: Diagnosis not present

## 2024-09-29 NOTE — Telephone Encounter (Signed)
 Pt called to Follow up about  Medication request Pharmacy  Stated they have not received anything from office for PT . Pt is out of medication and is concern

## 2024-09-30 ENCOUNTER — Emergency Department
Admission: EM | Admit: 2024-09-30 | Discharge: 2024-09-30 | Disposition: A | Discharge status: Home / Self Care | Attending: Emergency Medicine | Admitting: Emergency Medicine

## 2024-09-30 ENCOUNTER — Emergency Department

## 2024-09-30 ENCOUNTER — Other Ambulatory Visit: Payer: Self-pay

## 2024-09-30 DIAGNOSIS — I110 Hypertensive heart disease with heart failure: Secondary | ICD-10-CM | POA: Insufficient documentation

## 2024-09-30 DIAGNOSIS — W228XXA Striking against or struck by other objects, initial encounter: Secondary | ICD-10-CM | POA: Insufficient documentation

## 2024-09-30 DIAGNOSIS — E119 Type 2 diabetes mellitus without complications: Secondary | ICD-10-CM | POA: Insufficient documentation

## 2024-09-30 DIAGNOSIS — I509 Heart failure, unspecified: Secondary | ICD-10-CM | POA: Insufficient documentation

## 2024-09-30 DIAGNOSIS — I6200 Nontraumatic subdural hemorrhage, unspecified: Secondary | ICD-10-CM | POA: Insufficient documentation

## 2024-09-30 DIAGNOSIS — M25532 Pain in left wrist: Secondary | ICD-10-CM | POA: Insufficient documentation

## 2024-09-30 NOTE — ED Triage Notes (Signed)
 Pt presents to ER from Urgent care, pt reports on Thursday he went to the wound care clinic where he is being followed for an ulcer on his left heel. Pt reports was trying to get out of his truck and fell hitting his forehead, left wrist. Pt reports he was assessed by a doctor at the clinic. Reports he went to urgent care today due to pain to left wrist from fall but they recommended to come to ER. Pt denies passing out when he fel.

## 2024-09-30 NOTE — ED Provider Notes (Signed)
 [facility] Provider Note    Event Date/Time   First MD Initiated Contact with Patient 09/30/24 1612     (approximate)   History   Fall and Wrist Pain   HPI  NIKHOLAS GEFFRE is a 78 y.o. 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  Patient was trying to get out of his truck on Thursday then fell hitting his forehead and catching himself with his left wrist.  He was going to an appointment at a wound clinic when this happened.  He was assessed in the office.  He presented to urgent care earlier today for evaluation of his ongoing wrist pain and was directed to come to the emergency department due to his head injury.  Denies feeling dizzy at the time of the fall.  No LOC or headache.  Patient reports continued pain to his left wrist and wants to make sure that he did not break anything during the fall.      Physical Exam   Triage Vital Signs: ED Triage Vitals  Encounter Vitals Group     BP 09/30/24 1549 (!) 107/52     Girls Systolic BP Percentile --      Girls Diastolic BP Percentile --      Boys Systolic BP Percentile --      Boys Diastolic BP Percentile --      Pulse Rate 09/30/24 1549 72     Resp 09/30/24 1549 16     Temp 09/30/24 1549 98.3 F (36.8 C)     Temp Source 09/30/24 1549 Oral     SpO2 09/30/24 1549 96 %     Weight 09/30/24 1550 260 lb (117.9 kg)     Height 09/30/24 1550 5' 10 (1.778 m)     Head Circumference --      Peak Flow --      Pain Score 09/30/24 1550 7     Pain Loc --      Pain Education --      Exclude from Growth Chart --     Most recent vital signs: Vitals:   09/30/24 1549 09/30/24 1729  BP: (!) 107/52   Pulse: 72   Resp: 16   Temp: 98.3 F (36.8 C)   SpO2: 96% 97%   General: Awake, no distress.  CV:  Good peripheral perfusion.  RRR, murmur present. Resp:  Normal effort.  CTAB. Abd:  No distention.   Other:  Multiple abrasions on patient's body including right forehead, bilateral hands and bilateral knees.  PERRL, EOM intact, symmetric facial movements, no pronator drift, no ataxia with finger-to-nose test.   Left wrist:  Mild tenderness to palpation over the distal radius and ulna, no tenderness to palpation over the snuffbox, wrist range of motion is limited and does elicit some pain with movement, grip strength slightly decreased, radial pulse 2+ and regular, sensation intact   ED Results / Procedures / Treatments   Labs (all labs ordered are listed, but only abnormal results are displayed) Labs Reviewed - No data to display  RADIOLOGY  Left wrist x-ray and CT head obtained, I interpreted the images as well as reviewed the radiologist report.  Left wrist x-ray did not show any fractures but does demonstrate degenerative changes.  CT head shows a new small subdural hematoma over the right cerebral convexity without significant mass effect as well as some mild  right frontal scalp soft tissue swelling.  PROCEDURES:  Critical Care performed: No  Procedures   MEDICATIONS ORDERED IN ED: Medications - No data to display   IMPRESSION / MDM / ASSESSMENT AND PLAN / ED COURSE  I reviewed the triage vital signs and the nursing notes.                             78 year old male presents for evaluation of ongoing wrist pain after a fall.  Vital signs stable, patient NAD on exam.  Differential diagnosis includes, but is not limited to, wrist fracture, wrist sprain, arthritis, closed head injury, intracranial bleed, skull fracture.  Patient's presentation is most consistent with acute complicated illness / injury requiring diagnostic workup.  X-ray of the left wrist did not show any fractures but does demonstrate degenerative changes.  Patient did not have any tenderness over the anatomical snuffbox so do not suspect occult scaphoid fracture.  Given patient's pain will give him a brace  for comfort and recommended that he follow-up with orthopedics if he has ongoing pain.  Encouraged treatment of pain using Tylenol , ice and elevation.  Patient denied headache, dizziness, blurry vision and other neurological symptoms.  Neurologic exam was reassuring that he did not have any focal deficits.  CT scan demonstrated a small subdural hematoma.  I reached out to the on-call neurosurgeon, Dr. Deatrice for recommendations regarding this.  He recommended a repeat CT scan in 6 hours to assess for stability of the bleed.  He also recommended holding the warfarin and the aspirin  for 7 days.  If CT scan in 6 hours was stable patient is to follow-up with him as an outpatient as well as speak with his cardiologist in regards to stopping the warfarin and aspirin .  Patient was updated on the recommended plan.  Patient explained that he did not want to wait 6 hours for repeat imaging.  Patient would prefer to go home and follow-up with Dr. Deatrice and his cardiologist as an outpatient.  I explained that leaving the hospital without getting the repeat imaging would be leaving AGAINST MEDICAL ADVICE and I communicated the potential risks associated with this including brain damage, permanent disability and death.  I educated the patient on what to look for in terms of symptoms of a worsening bleed like headache, dizziness, blurry vision, vomiting, slurred speech, facial droop or one-sided weakness.  Patient was able to repeat back to me the risks of leaving AMA.  I feel that he has full capacity to make this decisions and fully understands the risks.  I did offer to check patient's INR before he left and he politely declined, stating that he was going to have this drawn on Monday.  Patient was made aware that he could return to the emergency department at any time for reevaluation.  Patient voiced understanding, all questions were answered and he was stable at discharge.     FINAL CLINICAL IMPRESSION(S) / ED DIAGNOSES    Final diagnoses:  Subdural bleeding (HCC)  Left wrist pain     Rx / DC Orders   ED Discharge Orders     None        Note:  This document was prepared using Dragon voice recognition software and may include unintentional dictation errors.   Cleaster Tinnie LABOR, PA-C 09/30/24 1918    Jacolyn Pae, MD 09/30/24 [phone]

## 2024-09-30 NOTE — Discharge Instructions (Addendum)
 The imaging of your wrist did not show any fractures today but did demonstrate some arthritis.  I believe you may have sprained your wrist in the fall.  I recommend wearing the wrist brace for the next week and you can take it off if you are worse to begins to feel better.  Occasionally, x-rays miss a small fracture of one of the wrist bones called the scaphoid.  If your pain is persistent past a week please follow-up with the orthopedic specialist attached for further evaluation.  They may need to get another x-ray to confirm that it is not broken.  You can take Tylenol  as needed for pain.  I also recommend icing and elevating your wrist as this will decrease pain and swelling.  The imaging of your brain showed a small subdural bleed.  Please schedule a follow-up appointment with Dr. Deatrice whose information is attached.  His office should also be reaching out to you to schedule an appointment.  Because you have a small bleed in your brain you will need to stop taking the warfarin and the aspirin  as both of these are blood thinners and will make it more difficult for this bleeding to stop.  You will also need to reach out to your cardiologist in regards to stopping the warfarin and aspirin .  These medications have been prescribed due to your mechanical valve and you will need to discuss stopping these medications with your cardiologist.  You should stop taking the warfarin and aspirin  for the next 7 days.  After this you can resume taking them but this should be confirmed with both your cardiologist and the neurologist, Dr. Deatrice.  You chose to leave against medical adivce today, you can return to the emergency department at any time for evaluation.  We did not obtain a repeat CT scan to confirm that the bleed is not getting worse.  If you develop a headache, visual changes, dizziness, slurred speech, weakness, facial droop or repeat vomiting this may be signs that the bleed is getting worse and I recommend  promptly returning to the emergency department.

## 2024-10-02 ENCOUNTER — Telehealth: Payer: Self-pay

## 2024-10-02 NOTE — Telephone Encounter (Signed)
 The ER called Dr Deatrice while he was covering for on 09/30/24.   Per Dr Deatrice: [phone] PMH sig for aortic valve (biprosthetic, 2014) on coumadin /asa s/p fall head strike no LOC, neuro intact.  CT head with trace R frontal convexity SDH. PLAN: Was going to get repeat CT 6 hours but he left AMA He was instructed to hold coumadin /asa for at least a week but to contact his cardiologist within this week to discuss Will need outpatient head CT and followup

## 2024-10-02 NOTE — Telephone Encounter (Signed)
 Dr Claudene has offered to see him on Wednesday (OK to double book at 9am per Dr Claudene). I attempted to reach the patient, but his phone went straight to voicemail. I left a message stating that I was calling from [facility] to schedule an appointment and that we would like to bring him in on Wednesday, if he could call us  back ASAP tomorrow.

## 2024-10-03 NOTE — Telephone Encounter (Signed)
 Per chart patient was scheduled by Caitlin for 10/04/2024

## 2024-10-04 ENCOUNTER — Ambulatory Visit: Admitting: Neurosurgery

## 2024-10-04 ENCOUNTER — Other Ambulatory Visit: Payer: Self-pay

## 2024-10-04 ENCOUNTER — Encounter: Payer: Self-pay | Admitting: Neurosurgery

## 2024-10-04 VITALS — BP 118/76 | Ht 70.0 in | Wt 263.0 lb

## 2024-10-04 DIAGNOSIS — W19XXXA Unspecified fall, initial encounter: Secondary | ICD-10-CM

## 2024-10-04 DIAGNOSIS — S065XAA Traumatic subdural hemorrhage with loss of consciousness status unknown, initial encounter: Secondary | ICD-10-CM

## 2024-10-04 MED ORDER — PREGABALIN 225 MG PO CAPS: ORAL_CAPSULE | ORAL | 0 refills | Status: AC

## 2024-10-04 NOTE — Telephone Encounter (Signed)
 I called patient he stated he called requesting refills on Pregabalin , I explained that dose has been increased to 225 mg tablet. Pt said she was aware of this.   Last seen on 12/22/23 Follow up scheduled on 02/01/25   Dispensed Days Supply Quantity Provider Pharmacy  PREGABALIN  225 MG CAPSULE 06/29/2024 90 270 each Camara, Amadou, MD [facility] [phone] - M...      Rx pending to be signed

## 2024-10-04 NOTE — Telephone Encounter (Signed)
 Patient was seen in our office today and mentioned that he was having trouble getting his Lyrica  150 mg RX sent to the pharmacy. I see that this was sent to [facility] in November but transmission failed. He has not head from anyone about his medication and we offered to send a message to your office to review. Can someone please follow up and call the pharmacy or re send it. Thank you for your help.

## 2024-10-04 NOTE — Progress Notes (Signed)
 Referring Physician:  Auston Reyes BIRCH, MD [address] [facility] [place],  [place] [address]  Primary Physician:  Auston Reyes BIRCH, MD  Discussed the use of AI scribe software for clinical note transcription with the patient, who gave verbal consent to proceed.  History of Present Illness [name] is a 78 year old male who presents for follow-up after a head injury.  He has had no headaches, weakness, or numbness since the injury. He feels depressed, which he attributes to aging and loss of prior activities, and he has not been to his workshop for four months.  He typically uses a walker for mobility but was unable to bring it today due to difficulty accessing it from his truck. He fell in the parking lot last Thursday and was assisted to get up by staff.   Review of Systems:  A 10 point review of systems is negative, except for the pertinent positives and negatives detailed in the HPI.  Past Medical History: Past Medical History:  Diagnosis Date   Anemia    CHF (congestive heart failure) (HCC)    Chronic pain    Coronary artery disease    DDD (degenerative disc disease), hip    Diabetes mellitus without complication (HCC)    DJD (degenerative joint disease)    Dysrhythmia    GERD (gastroesophageal reflux disease)    History of hip replacement    Hyperlipemia    Hypertension    Insomnia    Neuropathy    Paroxysmal A-fib (HCC)    Peripheral neuropathy    Stroke [facility])    Thoracic aortic atherosclerosis     Past Surgical History: Past Surgical History:  Procedure Laterality Date   BACK SURGERY     CARDIAC SURGERY     CARDIAC VALVE REPLACEMENT     CARDIOVERSION N/A 05/04/2017   Procedure: CARDIOVERSION;  Surgeon: Hester Wolm JINNY, MD;  Location: [facility] ORS;  Service: Cardiovascular;  Laterality: N/A;   CARDIOVERSION N/A 09/04/2021   Procedure: CARDIOVERSION;  Surgeon: Hester Wolm JINNY, MD;  Location: [facility] ORS;  Service:  Cardiovascular;  Laterality: N/A;   carpel tunnel release     CATARACT EXTRACTION W/ INTRAOCULAR LENS IMPLANT Bilateral    COLONOSCOPY     COLONOSCOPY WITH ESOPHAGOGASTRODUODENOSCOPY (EGD)     COLONOSCOPY WITH PROPOFOL  N/A 05/16/2018   Procedure: COLONOSCOPY WITH PROPOFOL ;  Surgeon: Viktoria Lamar DASEN, MD;  Location: [facility] ENDOSCOPY;  Service: Endoscopy;  Laterality: N/A;   COLONOSCOPY WITH PROPOFOL  N/A 08/06/2021   Procedure: COLONOSCOPY WITH PROPOFOL ;  Surgeon: Toledo, Ladell POUR, MD;  Location: [facility] ENDOSCOPY;  Service: Gastroenterology;  Laterality: N/A;   ESOPHAGOGASTRODUODENOSCOPY (EGD) WITH PROPOFOL  N/A 05/16/2018   Procedure: ESOPHAGOGASTRODUODENOSCOPY (EGD) WITH PROPOFOL ;  Surgeon: Viktoria Lamar DASEN, MD;  Location: [facility] ENDOSCOPY;  Service: Endoscopy;  Laterality: N/A;   JOINT REPLACEMENT     SKIN CANCER EXCISION Left 11/30/2023   TEE WITHOUT CARDIOVERSION N/A 10/08/2016   Procedure: TRANSESOPHAGEAL ECHOCARDIOGRAM (TEE);  Surgeon: Vinie DELENA Jude, MD;  Location: [facility] ORS;  Service: Cardiovascular;  Laterality: N/A;   TEE WITHOUT CARDIOVERSION N/A 11/23/2016   Procedure: TRANSESOPHAGEAL ECHOCARDIOGRAM (TEE);  Surgeon: Marsa Dooms, MD;  Location: [facility] ORS;  Service: Cardiovascular;  Laterality: N/A;   TONSILLECTOMY     VSD REPAIR      Allergies: Allergies as of 10/04/2024 - Review Complete 10/04/2024  Allergen Reaction Noted   Tetracycline Hives 12/05/2012   Tetracyclines & related Hives 10/02/2016    Medications: Current  Medications[1]  Social History: Social History[2]  Family Medical History: Family History  Problem Relation Age of Onset   Stroke Mother    Heart attack Mother    Heart attack Father     Physical Examination: Vitals:   10/04/24 0856  BP: 118/76   NEUROLOGICAL:     Physical Exam NEUROLOGICAL: Cranial nerves II-XII grossly intact.  No evidence pronator drift.  Awake and oriented x 3. MSK/skin: Abrasion to the right forehead  posttraumatic Gait: Ambulates with a cane, often uses a walker at baseline    Imaging: Narrative & Impression EXAM: CT HEAD WITHOUT 09/30/2024 04:27:21 PM   TECHNIQUE: CT of the head was performed without the administration of intravenous contrast. Automated exposure control, iterative reconstruction, and/or weight based adjustment of the mA/kV was utilized to reduce the radiation dose to as low as reasonably achievable.   COMPARISON: Head CT 04/27/2024 and MRI 07/24/2024.   CLINICAL HISTORY: Head trauma, minor (Age >= 65y).   FINDINGS:   BRAIN AND VENTRICLES: A small, low to intermediate density subdural hematoma over the right cerebral convexity measures up to 4 mm in thickness and is new from 08/13/2024 but of indeterminate acuity. There is no midline shift or other significant mass effect. No acute infarct, mass, or hydrocephalus is evident. Mild cerebral atrophy is within normal limits for age. Cerebral white matter hypodensities are nonspecific but compatible with mild chronic small vessel ischemic disease. Calcified atherosclerosis at the skull base.   ORBITS: Bilateral cataract extraction.   SINUSES AND MASTOIDS: Trace right and small left mastoid effusions. Clear paranasal sinuses.   SOFT TISSUES AND SKULL: Mild right frontal scalp soft tissue swelling. No acute skull fracture.   Emergent findings were communicated by telephone to Dr. Jacolyn on 09/30/2024 at 4:44 pm.   IMPRESSION: 1. New small subdural hematoma over the right cerebral convexity without significant mass effect. 2. Mild right frontal scalp soft tissue swelling.   Electronically signed by: Dasie Hamburg MD 09/30/2024 04:44 PM EST RP Workstation: [id number]  I have personally reviewed the images and agree with the above interpretation.  Assessment and Plan Assessment & Plan Traumatic subdural hematoma Asymptomatic for the last few days with no headaches, weakness, or numbness.  CT  noncontrast in the hospital showed a small convexity subdural hematoma without significant mass effect or midline shift.  Emotional changes are attributed to age-related depression rather than the hematoma. High likelihood of cessation of bleeding, but confirmation is necessary. - Ordered routine CT scan to confirm cessation of bleeding - Will communicate CT scan results via phone or message - No follow-up needed unless issues arise   Penne MICAEL Sharps MD/MSCR Neurosurgery      [1]  Current Outpatient Medications:    amiodarone  (PACERONE ) 200 MG tablet, Take 1 tablet (200 mg total) by mouth 2 (two) times daily., Disp: 60 tablet, Rfl: 0   cyanocobalamin  (,VITAMIN B-12,) 1000 MCG/ML injection, Inject 1,000 mcg into the muscle every 30 (thirty) days., Disp: , Rfl:    cyclobenzaprine  (FLEXERIL ) 10 MG tablet, Take 10 mg by mouth daily as needed for muscle spasms., Disp: , Rfl:    DULoxetine  (CYMBALTA ) 60 MG capsule, Take 1 capsule (60 mg total) by mouth every evening., Disp: 90 capsule, Rfl: 3   ferrous sulfate  325 (65 FE) MG tablet, Take 1 tablet (325 mg total) by mouth 2 (two) times daily with a meal. (Patient taking differently: Take 325 mg by mouth daily.), Disp: 60 tablet, Rfl: 3   glipiZIDE (GLUCOTROL XL)  5 MG 24 hr tablet, Take 5 mg by mouth daily., Disp: , Rfl:    loratadine  (CLARITIN ) 10 MG tablet, Take 10 mg by mouth daily., Disp: , Rfl:    losartan (COZAAR) 50 MG tablet, Take 50 mg by mouth daily., Disp: , Rfl:    metFORMIN  (GLUCOPHAGE ) 1000 MG tablet, Take 1,000 mg by mouth 2 (two) times daily. With meals., Disp: , Rfl:    methadone  (DOLOPHINE ) 5 MG tablet, Take 5 mg by mouth 2 (two) times daily. , Disp: , Rfl:    methimazole  (TAPAZOLE ) 10 MG tablet, Take 20 mg by mouth daily., Disp: , Rfl:    MOUNJARO 10 MG/0.5ML Pen, Inject 10 mg into the skin once a week., Disp: , Rfl:    Multiple Vitamin (MULTI-VITAMINS) TABS, Take 1 tablet by mouth daily., Disp: , Rfl:    omeprazole (PRILOSEC) 40  MG capsule, Take 40 mg by mouth daily., Disp: , Rfl:    pravastatin  (PRAVACHOL ) 40 MG tablet, Take 40 mg by mouth every evening. , Disp: , Rfl:    pregabalin  (LYRICA ) 225 MG capsule, Take 1 capsule (225 mg total) by mouth daily AND 2 capsules (450 mg total) every evening., Disp: 270 capsule, Rfl: 0   torsemide  (DEMADEX ) 20 MG tablet, Take by mouth daily., Disp: , Rfl:    warfarin (COUMADIN ) 2.5 MG tablet, Take 2.5 mg by mouth. Take on Monday and Friday only, Disp: , Rfl:    warfarin (COUMADIN ) 5 MG tablet, Take 5 mg by mouth daily. Take on Tuesday, Wednesday, Thursday, Saturday, and Sunday, Disp: , Rfl:  [2]  Social History Tobacco Use   Smoking status: Former    Current packs/day: 0.00    Types: Cigarettes    Quit date: 07/09/1996    Years since quitting: 28.2   Smokeless tobacco: Never  Vaping Use   Vaping status: Never Used  Substance Use Topics   Alcohol use: Yes    Alcohol/week: 2.0 standard drinks of alcohol    Types: 1 Cans of beer, 1 Standard drinks or equivalent per week    Comment: occasional   Drug use: No

## 2024-10-05 ENCOUNTER — Encounter: Admitting: Internal Medicine

## 2024-10-05 DIAGNOSIS — E11621 Type 2 diabetes mellitus with foot ulcer: Secondary | ICD-10-CM | POA: Diagnosis not present

## 2024-10-11 ENCOUNTER — Encounter: Admitting: Physician Assistant

## 2024-10-11 DIAGNOSIS — E11621 Type 2 diabetes mellitus with foot ulcer: Secondary | ICD-10-CM | POA: Diagnosis not present

## 2024-10-26 ENCOUNTER — Encounter: Attending: Physician Assistant | Admitting: Physician Assistant

## 2024-10-26 DIAGNOSIS — L98494 Non-pressure chronic ulcer of skin of other sites with necrosis of bone: Secondary | ICD-10-CM | POA: Insufficient documentation

## 2024-10-26 DIAGNOSIS — L97521 Non-pressure chronic ulcer of other part of left foot limited to breakdown of skin: Secondary | ICD-10-CM | POA: Insufficient documentation

## 2024-10-26 DIAGNOSIS — L97422 Non-pressure chronic ulcer of left heel and midfoot with fat layer exposed: Secondary | ICD-10-CM | POA: Insufficient documentation

## 2024-10-26 DIAGNOSIS — E11621 Type 2 diabetes mellitus with foot ulcer: Secondary | ICD-10-CM | POA: Insufficient documentation

## 2024-10-26 DIAGNOSIS — E1142 Type 2 diabetes mellitus with diabetic polyneuropathy: Secondary | ICD-10-CM | POA: Insufficient documentation

## 2024-10-26 DIAGNOSIS — M86341 Chronic multifocal osteomyelitis, right hand: Secondary | ICD-10-CM | POA: Insufficient documentation

## 2024-10-26 NOTE — Progress Notes (Signed)
 [name]                                          MRN: [id number]   10/26/2024   The [facility] Quality Team Specialist reviewed this patient medical record for the purposes of chart review for care gap closure. The following were reviewed: chart review for care gap closure-kidney health evaluation for diabetes:eGFR  and uACR.    [facility] Quality Team

## 2024-11-01 ENCOUNTER — Ambulatory Visit
Admission: RE | Admit: 2024-11-01 | Discharge: 2024-11-01 | Disposition: A | Discharge status: Home / Self Care | Source: Ambulatory Visit | Attending: Neurosurgery | Admitting: Neurosurgery

## 2024-11-01 DIAGNOSIS — S065XAA Traumatic subdural hemorrhage with loss of consciousness status unknown, initial encounter: Secondary | ICD-10-CM | POA: Diagnosis present

## 2024-11-02 ENCOUNTER — Encounter: Admitting: Physician Assistant

## 2024-11-02 DIAGNOSIS — M86341 Chronic multifocal osteomyelitis, right hand: Secondary | ICD-10-CM | POA: Diagnosis not present

## 2024-11-06 ENCOUNTER — Ambulatory Visit: Payer: Self-pay | Admitting: Neurosurgery

## 2024-11-09 ENCOUNTER — Encounter: Admitting: Physician Assistant

## 2024-11-09 DIAGNOSIS — M86341 Chronic multifocal osteomyelitis, right hand: Secondary | ICD-10-CM | POA: Diagnosis not present

## 2024-11-16 ENCOUNTER — Encounter: Admitting: Physician Assistant

## 2024-11-16 DIAGNOSIS — M86341 Chronic multifocal osteomyelitis, right hand: Secondary | ICD-10-CM | POA: Diagnosis not present

## 2024-11-23 ENCOUNTER — Encounter: Admitting: Physician Assistant

## 2024-11-30 ENCOUNTER — Encounter: Admitting: Physician Assistant

## 2024-12-07 ENCOUNTER — Encounter: Admitting: Physician Assistant

## 2024-12-14 ENCOUNTER — Encounter: Admitting: Physician Assistant

## 2025-02-01 ENCOUNTER — Ambulatory Visit: Admitting: Neurology
